# Patient Record
Sex: Male | Born: 1940
Health system: Southern US, Community
[De-identification: ages and names within clinical notes are randomized; demographics above are authoritative.]

## PROBLEM LIST (undated history)

## (undated) DIAGNOSIS — H9191 Unspecified hearing loss, right ear: Secondary | ICD-10-CM

## (undated) DIAGNOSIS — IMO0001 Reserved for inherently not codable concepts without codable children: Secondary | ICD-10-CM

## (undated) DIAGNOSIS — E11319 Type 2 diabetes mellitus with unspecified diabetic retinopathy without macular edema: Secondary | ICD-10-CM

## (undated) DIAGNOSIS — S065X9A Traumatic subdural hemorrhage with loss of consciousness of unspecified duration, initial encounter: Secondary | ICD-10-CM

## (undated) DIAGNOSIS — C61 Malignant neoplasm of prostate: Secondary | ICD-10-CM

## (undated) DIAGNOSIS — M549 Dorsalgia, unspecified: Secondary | ICD-10-CM

## (undated) DIAGNOSIS — N189 Chronic kidney disease, unspecified: Secondary | ICD-10-CM

## (undated) DIAGNOSIS — K760 Fatty (change of) liver, not elsewhere classified: Secondary | ICD-10-CM

## (undated) DIAGNOSIS — K8689 Other specified diseases of pancreas: Secondary | ICD-10-CM

## (undated) DIAGNOSIS — Z974 Presence of external hearing-aid: Secondary | ICD-10-CM

## (undated) DIAGNOSIS — E119 Type 2 diabetes mellitus without complications: Secondary | ICD-10-CM

## (undated) DIAGNOSIS — E782 Mixed hyperlipidemia: Secondary | ICD-10-CM

## (undated) DIAGNOSIS — M1990 Unspecified osteoarthritis, unspecified site: Secondary | ICD-10-CM

## (undated) DIAGNOSIS — N200 Calculus of kidney: Secondary | ICD-10-CM

## (undated) DIAGNOSIS — G8929 Other chronic pain: Secondary | ICD-10-CM

## (undated) DIAGNOSIS — S065XAA Traumatic subdural hemorrhage with loss of consciousness status unknown, initial encounter: Secondary | ICD-10-CM

## (undated) DIAGNOSIS — I5189 Other ill-defined heart diseases: Secondary | ICD-10-CM

## (undated) DIAGNOSIS — I10 Essential (primary) hypertension: Secondary | ICD-10-CM

## (undated) DIAGNOSIS — R011 Cardiac murmur, unspecified: Secondary | ICD-10-CM

## (undated) DIAGNOSIS — E875 Hyperkalemia: Secondary | ICD-10-CM

## (undated) DIAGNOSIS — H9190 Unspecified hearing loss, unspecified ear: Secondary | ICD-10-CM

## (undated) DIAGNOSIS — I2510 Atherosclerotic heart disease of native coronary artery without angina pectoris: Secondary | ICD-10-CM

## (undated) HISTORY — DX: Type 2 diabetes mellitus with unspecified diabetic retinopathy without macular edema: E11.319

## (undated) HISTORY — DX: Malignant neoplasm of prostate: C61

## (undated) HISTORY — DX: Fatty (change of) liver, not elsewhere classified: K76.0

## (undated) HISTORY — DX: Other chronic pain: M54.9

## (undated) HISTORY — DX: Mixed hyperlipidemia: E78.2

## (undated) HISTORY — DX: Essential (primary) hypertension: I10

## (undated) HISTORY — DX: Atherosclerotic heart disease of native coronary artery without angina pectoris: I25.10

## (undated) HISTORY — DX: Other specified diseases of pancreas: K86.89

## (undated) HISTORY — PX: CHOLECYSTECTOMY: SHX55

## (undated) HISTORY — DX: Other chronic pain: G89.29

## (undated) HISTORY — PX: HERNIA REPAIR: SHX51

## (undated) HISTORY — PX: OTHER SURGICAL HISTORY: SHX169

## (undated) HISTORY — PX: APPENDECTOMY: SHX54

## (undated) HISTORY — DX: Type 2 diabetes mellitus without complications: E11.9

---

## 2005-10-19 ENCOUNTER — Other Ambulatory Visit: Payer: Self-pay

## 2005-10-19 ENCOUNTER — Emergency Department: Payer: Self-pay | Admitting: Emergency Medicine

## 2005-10-21 ENCOUNTER — Observation Stay: Payer: Self-pay | Admitting: Internal Medicine

## 2005-10-21 ENCOUNTER — Other Ambulatory Visit: Payer: Self-pay

## 2009-08-05 DIAGNOSIS — C61 Malignant neoplasm of prostate: Secondary | ICD-10-CM

## 2009-08-05 HISTORY — PX: PROSTATECTOMY: SHX69

## 2009-08-05 HISTORY — DX: Malignant neoplasm of prostate: C61

## 2010-10-05 ENCOUNTER — Ambulatory Visit: Payer: Self-pay | Admitting: Unknown Physician Specialty

## 2010-11-04 ENCOUNTER — Ambulatory Visit: Payer: Self-pay | Admitting: Unknown Physician Specialty

## 2011-06-26 DIAGNOSIS — C61 Malignant neoplasm of prostate: Secondary | ICD-10-CM | POA: Insufficient documentation

## 2011-08-08 ENCOUNTER — Encounter: Payer: Self-pay | Admitting: Cardiology

## 2011-09-06 ENCOUNTER — Encounter: Payer: Self-pay | Admitting: Cardiology

## 2011-09-07 ENCOUNTER — Encounter: Payer: Self-pay | Admitting: Cardiology

## 2011-10-09 ENCOUNTER — Other Ambulatory Visit: Payer: Self-pay | Admitting: Family Medicine

## 2011-10-09 DIAGNOSIS — J984 Other disorders of lung: Secondary | ICD-10-CM

## 2011-10-10 ENCOUNTER — Ambulatory Visit (HOSPITAL_COMMUNITY)
Admission: RE | Admit: 2011-10-10 | Discharge: 2011-10-10 | Disposition: A | Discharge status: Home / Self Care | Payer: Medicare Other | Source: Ambulatory Visit | Attending: Family Medicine | Admitting: Family Medicine

## 2011-10-10 ENCOUNTER — Encounter: Payer: Self-pay | Admitting: Cardiology

## 2011-10-10 ENCOUNTER — Encounter (HOSPITAL_COMMUNITY): Payer: Self-pay

## 2011-10-10 DIAGNOSIS — J984 Other disorders of lung: Secondary | ICD-10-CM | POA: Insufficient documentation

## 2011-10-10 MED ORDER — IOHEXOL 300 MG/ML  SOLN
80.0000 mL | Freq: Once | INTRAMUSCULAR | Status: AC | PRN
  Administered 2011-10-10: 80 mL via INTRAVENOUS

## 2011-12-04 ENCOUNTER — Ambulatory Visit: Payer: Medicare Other | Admitting: Cardiology

## 2011-12-04 ENCOUNTER — Encounter: Payer: Self-pay | Admitting: Cardiology

## 2011-12-04 DIAGNOSIS — I5189 Other ill-defined heart diseases: Secondary | ICD-10-CM

## 2011-12-04 HISTORY — DX: Other ill-defined heart diseases: I51.89

## 2011-12-05 ENCOUNTER — Ambulatory Visit (INDEPENDENT_AMBULATORY_CARE_PROVIDER_SITE_OTHER): Payer: Medicare Other | Admitting: Cardiology

## 2011-12-05 ENCOUNTER — Encounter: Payer: Self-pay | Admitting: Cardiology

## 2011-12-05 VITALS — BP 141/90 | HR 85 | Resp 16 | Ht 67.0 in | Wt 212.0 lb

## 2011-12-05 DIAGNOSIS — I2510 Atherosclerotic heart disease of native coronary artery without angina pectoris: Secondary | ICD-10-CM

## 2011-12-05 DIAGNOSIS — E119 Type 2 diabetes mellitus without complications: Secondary | ICD-10-CM | POA: Insufficient documentation

## 2011-12-05 DIAGNOSIS — I10 Essential (primary) hypertension: Secondary | ICD-10-CM | POA: Insufficient documentation

## 2011-12-05 DIAGNOSIS — E1169 Type 2 diabetes mellitus with other specified complication: Secondary | ICD-10-CM | POA: Insufficient documentation

## 2011-12-05 DIAGNOSIS — E782 Mixed hyperlipidemia: Secondary | ICD-10-CM | POA: Insufficient documentation

## 2011-12-05 DIAGNOSIS — R0602 Shortness of breath: Secondary | ICD-10-CM | POA: Insufficient documentation

## 2011-12-05 NOTE — Assessment & Plan Note (Signed)
Etiology at this point not clearly defined, could be multifactorial in light of pulmonary testing to date. He does however have significant cardiovascular risk factors including long-standing type 2 diabetes mellitus and hypertension, hyperlipidemia, family history of premature cardiovascular disease. Also has coronary calcifications noted on recent chest CT. His ECG is nonspecific. Has a soft S4 on examination. Recommendation at this point is to pursue a complete echocardiogram to assess cardiac structure and function, also evaluate for ischemia with a Lexiscan Myoview. Doubt that he would be able to adequately complete a standard treadmill test in light of his limiting shortness of breath and other orthopedic complaints. We will bring him back to the office to discuss the results.

## 2011-12-05 NOTE — Assessment & Plan Note (Signed)
On statin therapy, followed by Dr. Dennard Schaumann.

## 2011-12-05 NOTE — Assessment & Plan Note (Signed)
Long-standing, on medical therapy, followed by Dr. Dennard Schaumann.

## 2011-12-05 NOTE — Patient Instructions (Signed)
**Note De-Identified [name] Obfuscation** Your physician has requested that you have an echocardiogram. Echocardiography is a painless test that uses sound waves to create images of your heart. It provides your doctor with information about the size and shape of your heart and how well your heart's chambers and valves are working. This procedure takes approximately one hour. There are no restrictions for this procedure.  Your physician has requested that you have a lexiscan myoview. For further information please visit [email or web address]. Please follow instruction sheet, as given.  Your physician recommends that you schedule a follow-up appointment in: 3 weeks

## 2011-12-05 NOTE — Assessment & Plan Note (Signed)
Diagnosis based on documentation of coronary calcifications by chest CT. This is to be further assessed in light of his progressive shortness of breath for the presence of any obstructive CAD.

## 2011-12-05 NOTE — Assessment & Plan Note (Signed)
Blood pressure is mildly elevated today. He reports generally better control on home checks. States that he has been compliant with his medications.

## 2011-12-05 NOTE — Progress Notes (Signed)
Clinical Summary [name] is a 71 y.o.male referred for cardiology consultation by Dr. Dennard Schaumann. He presents with approximately 4 month history of progressive shortness of breath with activity. Describes himself as being previously active, working outdoors without major limitation other than orthopedic complaints related to chronic lower back pain and hip pain. Since then he has had shortness of breath even with basic activities of daily living, walking out of the car, going up steps. Has also had occasional cough, nonproductive. No fevers or chills. States he feels "full" in his chest, although denies any definite exertional chest pain.  Recent testing reviewed including PFTs showing FVC 47% predicted, FEV1 54% predicted. Chest CT reported elevation of the right hemidiaphragm and atelectasis in RML/RLL, also some degree of coronary calcifications. Labwork shows Hgb 12.8, potassium 4.8, BUN 30, creatinine 1.1, AST 30. ECG reviewed showing sinus rhythm with leftward axis, NST wave changes.  Medical history reviewed including long-standing diabetes and hypertension, managed medically. He does have some history of premature cardiovascular disease in his family. Reports a stress test at least 15 years ago, no ischemic testing since that time.  No Known Allergies  Current Outpatient Prescriptions  Medication Sig Dispense Refill  . aspirin 81 MG tablet Take 81 mg by mouth daily.      . carvedilol (COREG) 25 MG tablet Take 25 mg by mouth 2 (two) times daily with a meal.      . citalopram (CELEXA) 20 MG tablet Take 20 mg by mouth daily.      Marland Kitchen gabapentin (NEURONTIN) 300 MG capsule Take 600 mg by mouth at bedtime.      . hydrochlorothiazide (MICROZIDE) 12.5 MG capsule Take 12.5 mg by mouth daily.      . insulin NPH-insulin regular (NOVOLIN 70/30) (70-30) 100 UNIT/ML injection Inject into the skin.      Marland Kitchen losartan (COZAAR) 100 MG tablet Take 100 mg by mouth daily.      . metFORMIN (GLUCOPHAGE) 1000 MG  tablet Take 1,000 mg by mouth 2 (two) times daily with a meal.      . Multiple Vitamins-Minerals (MULTIVITAMIN PO) Take by mouth daily.      . Pancrelipase, Lip-Prot-Amyl, (CREON PO) Take by mouth.      . pravastatin (PRAVACHOL) 10 MG tablet Take 10 mg by mouth daily.        Past Medical History  Diagnosis Date  . Type 2 diabetes mellitus   . Essential hypertension, benign   . Prostate cancer 2011  . Mixed hyperlipidemia   . Chronic back pain     Disc disease  . Pancreatic insufficiency     Diagnosed at [facility]    Past Surgical History  Procedure Date  . Prostatectomy 2011  . Hernia repair   . Appendectomy   . Cholecystectomy   . Bilateral hip replacement     Family History  Problem Relation Age of Onset  . Heart attack Father   . Diabetes type II Mother     Social History [name] reports that he has never smoked. He has never used smokeless tobacco. [name] reports that he does not drink alcohol.  Review of Systems No palpitations, lightheadedness, syncope. No hemoptysis. Has tried to lose weight without significant change. Reports some fluctuation in his appetite. No melena or hematochezia. Otherwise negative except as outlined.  Physical Examination Filed Vitals:   12/05/11 0909  BP: 141/90  Pulse: 85  Resp: 16   Obese male in no acute distress. HEENT: Conjunctiva  and lids normal, oropharynx clear. Neck: Supple, prominent JV pulsations, no carotid bruits, no thyromegaly. Lungs: Clear to auscultation, decreased at bases, nonlabored breathing at rest, no wheezing. Cardiac: Regular rate and rhythm, S4 noted, no significant systolic murmur, no pericardial rub or knock. Abdomen: Protuberant, nontender, bowel sounds present, no guarding or rebound. Extremities: No pitting edema, distal pulses 2+. Skin: Warm and dry. Musculoskeletal: No kyphosis. Neuropsychiatric: Alert and oriented x3, affect grossly appropriate.     Problem List and Plan

## 2011-12-12 ENCOUNTER — Encounter (HOSPITAL_COMMUNITY)
Admission: RE | Admit: 2011-12-12 | Discharge: 2011-12-12 | Disposition: A | Discharge status: Home / Self Care | Payer: Medicare Other | Source: Ambulatory Visit | Attending: Cardiology | Admitting: Cardiology

## 2011-12-12 ENCOUNTER — Ambulatory Visit (HOSPITAL_COMMUNITY)
Admission: RE | Admit: 2011-12-12 | Discharge: 2011-12-12 | Disposition: A | Discharge status: Home / Self Care | Payer: Medicare Other | Source: Ambulatory Visit | Attending: Cardiology | Admitting: Cardiology

## 2011-12-12 ENCOUNTER — Encounter (HOSPITAL_COMMUNITY): Payer: Self-pay | Admitting: Cardiology

## 2011-12-12 ENCOUNTER — Ambulatory Visit (INDEPENDENT_AMBULATORY_CARE_PROVIDER_SITE_OTHER): Payer: Medicare Other

## 2011-12-12 ENCOUNTER — Encounter (HOSPITAL_COMMUNITY): Payer: Self-pay

## 2011-12-12 DIAGNOSIS — R0602 Shortness of breath: Secondary | ICD-10-CM

## 2011-12-12 DIAGNOSIS — E119 Type 2 diabetes mellitus without complications: Secondary | ICD-10-CM | POA: Insufficient documentation

## 2011-12-12 DIAGNOSIS — I2510 Atherosclerotic heart disease of native coronary artery without angina pectoris: Secondary | ICD-10-CM

## 2011-12-12 DIAGNOSIS — I059 Rheumatic mitral valve disease, unspecified: Secondary | ICD-10-CM

## 2011-12-12 DIAGNOSIS — E785 Hyperlipidemia, unspecified: Secondary | ICD-10-CM | POA: Insufficient documentation

## 2011-12-12 DIAGNOSIS — R0989 Other specified symptoms and signs involving the circulatory and respiratory systems: Secondary | ICD-10-CM | POA: Insufficient documentation

## 2011-12-12 DIAGNOSIS — I10 Essential (primary) hypertension: Secondary | ICD-10-CM | POA: Insufficient documentation

## 2011-12-12 DIAGNOSIS — R0609 Other forms of dyspnea: Secondary | ICD-10-CM | POA: Insufficient documentation

## 2011-12-12 DIAGNOSIS — R079 Chest pain, unspecified: Secondary | ICD-10-CM | POA: Insufficient documentation

## 2011-12-12 MED ORDER — TECHNETIUM TC 99M TETROFOSMIN IV KIT
10.0000 | PACK | Freq: Once | INTRAVENOUS | Status: AC | PRN
  Administered 2011-12-12: 9.5 via INTRAVENOUS

## 2011-12-12 MED ORDER — TECHNETIUM TC 99M TETROFOSMIN IV KIT
30.0000 | PACK | Freq: Once | INTRAVENOUS | Status: AC | PRN
  Administered 2011-12-12: 29 via INTRAVENOUS

## 2011-12-12 NOTE — Progress Notes (Signed)
*  PRELIMINARY RESULTS* Echocardiogram 2D Echocardiogram has been performed.  Tera Partridge 12/12/2011, 8:41 AM

## 2011-12-12 NOTE — Progress Notes (Signed)
Stress Lab Nurses Notes - [name]  [name] 12/12/2011 Reason for doing test: CAD and Dyspnea Type of test: Leane Call Nurse performing test: Gerrit Halls, RN Nuclear Medicine Tech: Melburn Hake Echo Tech: Not Applicable MD performing test: Myles Gip Family MD: Pickard Test explained and consent signed: yes IV started: 22g jelco, Saline lock flushed, No redness or edema and Saline lock started in radiology Symptoms: SOB  Treatment/Intervention: None Reason test stopped: protocol completed After recovery IV was: Discontinued via X-ray tech and No redness or edema Patient to return to [place]. Med at : 12:30 Patient discharged: Home Patient's Condition upon discharge was: stable Comments: During test BP 158/88 & HR 100.  Recovery BP 148/78 & HR 90.  Symptoms resolved in recovery. Geanie Cooley T

## 2011-12-27 ENCOUNTER — Ambulatory Visit: Payer: Medicare Other | Admitting: Cardiology

## 2012-01-02 ENCOUNTER — Encounter: Payer: Self-pay | Admitting: Cardiology

## 2012-01-02 ENCOUNTER — Ambulatory Visit (INDEPENDENT_AMBULATORY_CARE_PROVIDER_SITE_OTHER): Payer: Medicare Other | Admitting: Cardiology

## 2012-01-02 VITALS — BP 135/82 | HR 82 | Resp 18 | Ht 69.0 in | Wt 211.0 lb

## 2012-01-02 DIAGNOSIS — I2510 Atherosclerotic heart disease of native coronary artery without angina pectoris: Secondary | ICD-10-CM

## 2012-01-02 DIAGNOSIS — I10 Essential (primary) hypertension: Secondary | ICD-10-CM

## 2012-01-02 DIAGNOSIS — E782 Mixed hyperlipidemia: Secondary | ICD-10-CM

## 2012-01-02 NOTE — Assessment & Plan Note (Signed)
Coronary artery calcifications by chest CT, also Myoview indicating element of scar in the inferolateral wall. No large ischemic zones noted however, and overall LVEF is normal. Particularly in light of his improved clinical status, plan will be to continue medical therapy and observation. Followup is arranged.

## 2012-01-02 NOTE — Progress Notes (Signed)
Clinical Summary [name] is a 71 y.o.male presenting for followup. He was seen in early May. He is here with his wife today to review testing. From a symptom perspective, he reports significantly improved breathing, no chest pain.  Followup echocardiogram demonstrated LVEF of 55-60% with mild basal septal hypertrophy, mild hypokinesis of the basal inferolateral myocardium, mild mitral regurgitation, mild left atrial enlargement, high normal pulmonary pressure. Myoview demonstrated no diagnostic ST segment changes. There was evidence of inferolateral scar versus attenuation with LVEF of 64%. No large areas of ischemia were noted.  We discussed his cardiac testing, diagnosis of underlying CAD, and a plan at this point for observation on medical therapy, particularly in light of the fact that he has improved clinically. We did discuss warning signs that might prompt further investigation.  No Known Allergies  Current Outpatient Prescriptions  Medication Sig Dispense Refill  . aspirin 81 MG tablet Take 81 mg by mouth daily.      . carvedilol (COREG) 25 MG tablet Take 25 mg by mouth 2 (two) times daily with a meal.      . citalopram (CELEXA) 20 MG tablet Take 20 mg by mouth daily.      Marland Kitchen gabapentin (NEURONTIN) 300 MG capsule Take 600 mg by mouth at bedtime.      . hydrochlorothiazide (MICROZIDE) 12.5 MG capsule Take 12.5 mg by mouth daily.      . insulin NPH-insulin regular (NOVOLIN 70/30) (70-30) 100 UNIT/ML injection Inject into the skin.      Marland Kitchen losartan (COZAAR) 100 MG tablet Take 100 mg by mouth daily.      . metFORMIN (GLUCOPHAGE) 1000 MG tablet Take 1,000 mg by mouth 2 (two) times daily with a meal.      . Multiple Vitamins-Minerals (MULTIVITAMIN PO) Take by mouth daily.      . Pancrelipase, Lip-Prot-Amyl, (CREON PO) Take by mouth.      . pravastatin (PRAVACHOL) 10 MG tablet Take 10 mg by mouth daily.        Past Medical History  Diagnosis Date  . Type 2 diabetes mellitus   .  Essential hypertension, benign   . Prostate cancer 2011  . Mixed hyperlipidemia   . Chronic back pain     Disc disease  . Pancreatic insufficiency     Diagnosed at [facility]  . Diabetes mellitus     Social History Mr. Durkin reports that he has never smoked. He has never used smokeless tobacco. Mr. Wanke reports that he does not drink alcohol.  Review of Systems No palpitations, orthopnea, PND. No reported bleeding problems. Otherwise negative.  Physical Examination Filed Vitals:   01/02/12 1428  BP: 135/82  Pulse: 82  Resp: 18    Obese male in no acute distress.  HEENT: Conjunctiva and lids normal, oropharynx clear.  Neck: Supple, prominent JV pulsations, no carotid bruits, no thyromegaly.  Lungs: Clear to auscultation, decreased at bases, nonlabored breathing at rest, no wheezing.  Cardiac: Regular rate and rhythm, S4 noted, no significant systolic murmur, no pericardial rub or knock.  Abdomen: Protuberant, nontender, bowel sounds present, no guarding or rebound.  Extremities: No pitting edema, distal pulses 2+.    Problem List and Plan   Coronary atherosclerosis of native coronary artery Coronary artery calcifications by chest CT, also Myoview indicating element of scar in the inferolateral wall. No large ischemic zones noted however, and overall LVEF is normal. Particularly in light of his improved clinical status, plan will be to continue medical therapy  and observation. Followup is arranged.  Essential hypertension, benign No change to current regimen.  Mixed hyperlipidemia Continue statin therapy.     Satira Sark, M.D., F.A.C.C.

## 2012-01-02 NOTE — Patient Instructions (Signed)
**Note De-identified Elvina Bosch Obfuscation** Your physician recommends that you continue on your current medications as directed. Please refer to the Current Medication list given to you today.  Your physician recommends that you schedule a follow-up appointment in: 6 months  

## 2012-01-02 NOTE — Assessment & Plan Note (Signed)
No change to current regimen.

## 2012-01-02 NOTE — Assessment & Plan Note (Signed)
Continue statin therapy.

## 2012-06-08 ENCOUNTER — Ambulatory Visit
Admission: RE | Admit: 2012-06-08 | Discharge: 2012-06-08 | Disposition: A | Discharge status: Home / Self Care | Payer: Medicare Other | Source: Ambulatory Visit | Attending: Family Medicine | Admitting: Family Medicine

## 2012-06-08 ENCOUNTER — Other Ambulatory Visit: Payer: Self-pay | Admitting: Family Medicine

## 2012-06-08 DIAGNOSIS — J189 Pneumonia, unspecified organism: Secondary | ICD-10-CM

## 2012-07-13 ENCOUNTER — Ambulatory Visit (INDEPENDENT_AMBULATORY_CARE_PROVIDER_SITE_OTHER): Payer: Medicare Other | Admitting: Cardiology

## 2012-07-13 ENCOUNTER — Encounter: Payer: Self-pay | Admitting: Cardiology

## 2012-07-13 VITALS — BP 130/80 | HR 74 | Ht 69.0 in | Wt 214.8 lb

## 2012-07-13 DIAGNOSIS — I10 Essential (primary) hypertension: Secondary | ICD-10-CM

## 2012-07-13 DIAGNOSIS — I2510 Atherosclerotic heart disease of native coronary artery without angina pectoris: Secondary | ICD-10-CM

## 2012-07-13 DIAGNOSIS — E782 Mixed hyperlipidemia: Secondary | ICD-10-CM

## 2012-07-13 NOTE — Assessment & Plan Note (Signed)
Keep followup with Dr. Dennard Schaumann. He continues on Pravachol. Goal LDL should be close to 70 if possible.

## 2012-07-13 NOTE — Assessment & Plan Note (Signed)
Continue medical therapy and observation for now. We have discussed warning signs and symptoms. Followup arranged.

## 2012-07-13 NOTE — Patient Instructions (Addendum)
Your physician recommends that you schedule a follow-up appointment in: 6 [place]

## 2012-07-13 NOTE — Progress Notes (Signed)
Clinical Summary [name] is a 71 y.o.male presenting for followup. He was seen in May. He is here with his wife, reports no significant angina symptoms. We have been managing him medically for CAD based on coronary calcifications by CT and Myoview demonstrating inferolateral scar. He reports tolerating his current medical regimen.  ECG today shows sinus rhythm with PRWP, possibly related to lead placement.  He continues to follow with Dr. Dennard Schaumann.  No Known Allergies  Current Outpatient Prescriptions  Medication Sig Dispense Refill  . aspirin 81 MG tablet Take 81 mg by mouth daily.      . carvedilol (COREG) 25 MG tablet Take 25 mg by mouth 2 (two) times daily with a meal.      . citalopram (CELEXA) 20 MG tablet Take 20 mg by mouth daily.      . diazepam (VALIUM) 10 MG tablet Take 10 mg by mouth every 8 (eight) hours as needed.       . gabapentin (NEURONTIN) 300 MG capsule Take 600 mg by mouth at bedtime.      . hydrochlorothiazide (MICROZIDE) 12.5 MG capsule Take 12.5 mg by mouth daily.      . insulin NPH-insulin regular (NOVOLIN 70/30) (70-30) 100 UNIT/ML injection Inject into the skin.      Marland Kitchen losartan (COZAAR) 100 MG tablet Take 100 mg by mouth daily.      . metFORMIN (GLUCOPHAGE) 1000 MG tablet Take 1,000 mg by mouth 2 (two) times daily with a meal.      . Multiple Vitamins-Minerals (MULTIVITAMIN PO) Take by mouth daily.      . Pancrelipase, Lip-Prot-Amyl, (CREON PO) Take by mouth.      . pravastatin (PRAVACHOL) 10 MG tablet Take 10 mg by mouth daily.      Marland Kitchen PROAIR HFA 108 (90 BASE) MCG/ACT inhaler Inhale 2 puffs into the lungs every 6 (six) hours as needed.         Past Medical History  Diagnosis Date  . Type 2 diabetes mellitus   . Essential hypertension, benign   . Prostate cancer 2011  . Mixed hyperlipidemia   . Chronic back pain     Disc disease  . Pancreatic insufficiency     Diagnosed at [facility]  . Diabetes mellitus   . Coronary atherosclerosis of native coronary  artery     Coronary calcifications by chest CT, Myoview demonstrating inferolateral scar    Social History [name] reports that he has never smoked. He has never used smokeless tobacco. [name] reports that he does not drink alcohol.  Review of Systems No palpitations. Reports recent "pneumonia" treated with outpatient antibiotics. Otherwise negative.  Physical Examination Filed Vitals:   07/13/12 1328  BP: 130/80  Pulse: 74   Filed Weights   07/13/12 1328  Weight: 214 lb 12.8 oz (97.433 kg)    No acute distress.  HEENT: Conjunctiva and lids normal, oropharynx clear.  Neck: Supple, prominent JV pulsations, no carotid bruits, no thyromegaly.  Lungs: Clear to auscultation, decreased at bases, nonlabored breathing at rest, no wheezing.  Cardiac: Regular rate and rhythm, S4 noted, no significant systolic murmur.  Extremities: No pitting edema, distal pulses 2+.    Problem List and Plan   Coronary atherosclerosis of native coronary artery Continue medical therapy and observation for now. We have discussed warning signs and symptoms. Followup arranged.  Essential hypertension, benign No change to current regimen. Regular exercise and sodium restriction.  Mixed hyperlipidemia Keep followup with Dr. Dennard Schaumann. He continues  on Pravachol. Goal LDL should be close to 70 if possible.    Satira Sark, M.D., F.A.C.C.

## 2012-07-13 NOTE — Assessment & Plan Note (Signed)
No change to current regimen. Regular exercise and sodium restriction.

## 2012-07-30 ENCOUNTER — Encounter (HOSPITAL_COMMUNITY): Payer: Self-pay

## 2012-07-30 ENCOUNTER — Emergency Department (HOSPITAL_COMMUNITY): Payer: Medicare Other

## 2012-07-30 ENCOUNTER — Emergency Department (HOSPITAL_COMMUNITY)
Admission: EM | Admit: 2012-07-30 | Discharge: 2012-07-30 | Disposition: A | Discharge status: Home / Self Care | Payer: Medicare Other | Attending: Emergency Medicine | Admitting: Emergency Medicine

## 2012-07-30 DIAGNOSIS — Z8546 Personal history of malignant neoplasm of prostate: Secondary | ICD-10-CM | POA: Insufficient documentation

## 2012-07-30 DIAGNOSIS — R062 Wheezing: Secondary | ICD-10-CM | POA: Insufficient documentation

## 2012-07-30 DIAGNOSIS — E876 Hypokalemia: Secondary | ICD-10-CM | POA: Insufficient documentation

## 2012-07-30 DIAGNOSIS — Z794 Long term (current) use of insulin: Secondary | ICD-10-CM | POA: Insufficient documentation

## 2012-07-30 DIAGNOSIS — Z7982 Long term (current) use of aspirin: Secondary | ICD-10-CM | POA: Insufficient documentation

## 2012-07-30 DIAGNOSIS — R5381 Other malaise: Secondary | ICD-10-CM | POA: Insufficient documentation

## 2012-07-30 DIAGNOSIS — E119 Type 2 diabetes mellitus without complications: Secondary | ICD-10-CM | POA: Insufficient documentation

## 2012-07-30 DIAGNOSIS — R059 Cough, unspecified: Secondary | ICD-10-CM | POA: Insufficient documentation

## 2012-07-30 DIAGNOSIS — R05 Cough: Secondary | ICD-10-CM | POA: Insufficient documentation

## 2012-07-30 DIAGNOSIS — I2510 Atherosclerotic heart disease of native coronary artery without angina pectoris: Secondary | ICD-10-CM | POA: Insufficient documentation

## 2012-07-30 DIAGNOSIS — E782 Mixed hyperlipidemia: Secondary | ICD-10-CM | POA: Insufficient documentation

## 2012-07-30 DIAGNOSIS — Z79899 Other long term (current) drug therapy: Secondary | ICD-10-CM | POA: Insufficient documentation

## 2012-07-30 DIAGNOSIS — M549 Dorsalgia, unspecified: Secondary | ICD-10-CM | POA: Insufficient documentation

## 2012-07-30 DIAGNOSIS — G8929 Other chronic pain: Secondary | ICD-10-CM | POA: Insufficient documentation

## 2012-07-30 DIAGNOSIS — I10 Essential (primary) hypertension: Secondary | ICD-10-CM | POA: Insufficient documentation

## 2012-07-30 DIAGNOSIS — Z8719 Personal history of other diseases of the digestive system: Secondary | ICD-10-CM | POA: Insufficient documentation

## 2012-07-30 HISTORY — DX: Other ill-defined heart diseases: I51.89

## 2012-07-30 LAB — COMPREHENSIVE METABOLIC PANEL
ALT: 63 U/L — ABNORMAL HIGH (ref 0–53)
Albumin: 3.1 g/dL — ABNORMAL LOW (ref 3.5–5.2)
Alkaline Phosphatase: 135 U/L — ABNORMAL HIGH (ref 39–117)
Calcium: 8.5 mg/dL (ref 8.4–10.5)
GFR calc Af Amer: 43 mL/min — ABNORMAL LOW (ref >90)
Potassium: 6 mEq/L — ABNORMAL HIGH (ref 3.5–5.1)
Sodium: 135 mEq/L (ref 135–145)
Total Protein: 6.5 g/dL (ref 6.0–8.3)

## 2012-07-30 LAB — URINALYSIS, ROUTINE W REFLEX MICROSCOPIC
Bilirubin Urine: NEGATIVE
Hgb urine dipstick: NEGATIVE
Nitrite: NEGATIVE
Specific Gravity, Urine: >1.03 — ABNORMAL HIGH (ref 1.005–1.030)
Urobilinogen, UA: 0.2 mg/dL (ref 0.0–1.0)
pH: 5.5 (ref 5.0–8.0)

## 2012-07-30 LAB — RAPID URINE DRUG SCREEN, HOSP PERFORMED
Barbiturates: NOT DETECTED
Benzodiazepines: POSITIVE — AB
Cocaine: NOT DETECTED
Opiates: POSITIVE — AB
Tetrahydrocannabinol: NOT DETECTED

## 2012-07-30 LAB — CBC WITH DIFFERENTIAL/PLATELET
Basophils Absolute: 0 10*3/uL (ref 0.0–0.1)
Basophils Relative: 0 % (ref 0–1)
Eosinophils Absolute: 0.3 10*3/uL (ref 0.0–0.7)
Eosinophils Relative: 5 % (ref 0–5)
MCH: 30.8 pg (ref 26.0–34.0)
MCHC: 31.6 g/dL (ref 30.0–36.0)
MCV: 97.6 fL (ref 78.0–100.0)
Neutrophils Relative %: 64 % (ref 43–77)
Platelets: 150 10*3/uL (ref 150–400)
RBC: 3.73 MIL/uL — ABNORMAL LOW (ref 4.22–5.81)
RDW: 13.4 % (ref 11.5–15.5)

## 2012-07-30 MED ORDER — SODIUM CHLORIDE 0.9 % IV BOLUS (SEPSIS)
500.0000 mL | Freq: Once | INTRAVENOUS | Status: AC
  Administered 2012-07-30: 14:00:00 via INTRAVENOUS

## 2012-07-30 MED ORDER — SODIUM CHLORIDE 0.9 % IV SOLN
Freq: Once | INTRAVENOUS | Status: AC
  Administered 2012-07-30: 1000 mL via INTRAVENOUS

## 2012-07-30 MED ORDER — ALBUTEROL SULFATE HFA 108 (90 BASE) MCG/ACT IN AERS: 1.0000 | INHALATION_SPRAY | Freq: Four times a day (QID) | RESPIRATORY_TRACT | Status: DC | PRN

## 2012-07-30 MED ORDER — NALOXONE HCL 1 MG/ML IJ SOLN
1.0000 mg | Freq: Once | INTRAMUSCULAR | Status: AC
  Administered 2012-07-30: 1 mg via INTRAVENOUS
  Filled 2012-07-30: qty 2

## 2012-07-30 NOTE — ED Provider Notes (Signed)
History    This chart was scribed for Mark Diego, MD, MD by Rhae Lerner, ED Scribe. The patient was seen in room APA17 and the patient's care was started at 12:11 PM.   CSN: [id number]  Arrival date & time 07/30/12  1142      Chief Complaint  Patient presents with  . Hypotension  . Chest Pain    (Consider location/radiation/quality/duration/timing/severity/associated sxs/prior treatment) Patient is a 71 y.o. male presenting with chest pain. The history is provided by the patient. No language interpreter was used.  Chest Pain The chest pain began 3 - 5 days ago. Chest pain occurs frequently. The chest pain is unchanged. The pain is associated with coughing. The pain does not radiate. Primary symptoms include fatigue, cough and wheezing. Pertinent negatives for primary symptoms include no abdominal pain.  The fatigue began 3 to 5 days ago. The fatigue has been unchanged since its onset. The fatigue is worsened by nothing.  The cough began 3 to 5 days ago. The cough is new.  Pertinent negatives for past medical history include no seizures.    [name] is a 71 y.o. male who presents to the Emergency Department complaining of fatigue onset 3 days ago. Pt's wife reports that pt has been sleepy and was in bed for the majority of yesterday. Pt's BP was 80/50 at PCP (Dr. Dennard Schaumann) and he was sent here for further evaluation. Wife reports that pt has been wheezing and had cough. Pt states he has associated chest pain. He denies any other pain.   Past Medical History  Diagnosis Date  . Type 2 diabetes mellitus   . Essential hypertension, benign   . Prostate cancer 2011  . Mixed hyperlipidemia   . Chronic back pain     Disc disease  . Pancreatic insufficiency     Diagnosed at [facility]  . Diabetes mellitus   . Coronary atherosclerosis of native coronary artery     Coronary calcifications by chest CT, Myoview demonstrating inferolateral scar    Past Surgical History  Procedure  Date  . Prostatectomy 2011  . Hernia repair   . Appendectomy   . Cholecystectomy   . Bilateral hip replacement     Family History  Problem Relation Age of Onset  . Heart attack Father   . Diabetes type II Mother     History  Substance Use Topics  . Smoking status: Never Smoker   . Smokeless tobacco: Never Used  . Alcohol Use: No      Review of Systems  Constitutional: Positive for fatigue.  HENT: Negative for congestion, sinus pressure and ear discharge.   Eyes: Negative for discharge.  Respiratory: Positive for cough and wheezing.   Cardiovascular: Positive for chest pain.  Gastrointestinal: Negative for abdominal pain and diarrhea.  Genitourinary: Negative for frequency and hematuria.  Musculoskeletal: Negative for back pain.  Skin: Negative for rash.  Neurological: Negative for seizures and headaches.  Hematological: Negative.   Psychiatric/Behavioral: Negative for hallucinations.  All other systems reviewed and are negative.    Allergies  Review of patient's allergies indicates no known allergies.  Home Medications   Current Outpatient Rx  Name  Route  Sig  Dispense  Refill  . ASPIRIN 81 MG PO TABS   Oral   Take 81 mg by mouth daily.         Marland Kitchen CARVEDILOL 25 MG PO TABS   Oral   Take 25 mg by mouth 2 (two) times daily with a  meal.         . CITALOPRAM HYDROBROMIDE 20 MG PO TABS   Oral   Take 20 mg by mouth daily.         Marland Kitchen DIAZEPAM 10 MG PO TABS   Oral   Take 10 mg by mouth every 8 (eight) hours as needed.          Marland Kitchen GABAPENTIN 300 MG PO CAPS   Oral   Take 600 mg by mouth at bedtime.         Marland Kitchen HYDROCHLOROTHIAZIDE 12.5 MG PO CAPS   Oral   Take 12.5 mg by mouth daily.         . INSULIN ISOPHANE & REGULAR (70-30) 100 UNIT/ML Craig SUSP   Subcutaneous   Inject into the skin.         Marland Kitchen LOSARTAN POTASSIUM 100 MG PO TABS   Oral   Take 100 mg by mouth daily.         Marland Kitchen METFORMIN HCL 1000 MG PO TABS   Oral   Take 1,000 mg by mouth  2 (two) times daily with a meal.         . MULTIVITAMIN PO   Oral   Take by mouth daily.         Marland Kitchen CREON PO   Oral   Take by mouth.         Marland Kitchen PRAVASTATIN SODIUM 10 MG PO TABS   Oral   Take 10 mg by mouth daily.         Marland Kitchen PROAIR HFA 108 (90 BASE) MCG/ACT IN AERS   Inhalation   Inhale 2 puffs into the lungs every 6 (six) hours as needed.            BP 115/60  Pulse 79  Temp 97.8 F (36.6 C) (Oral)  Resp 22  Wt 219 lb (99.338 kg)  SpO2 94%  Physical Exam  Nursing note and vitals reviewed. Constitutional: He is oriented to person, place, and time. He appears well-developed.       Lethargic   HENT:  Head: Normocephalic and atraumatic.  Eyes: Conjunctivae normal and EOM are normal. No scleral icterus.  Neck: Neck supple. No thyromegaly present.  Cardiovascular: Normal rate and regular rhythm.  Exam reveals no gallop and no friction rub.   No murmur heard. Pulmonary/Chest: No stridor. He has no wheezes. He has no rales. He exhibits no tenderness.  Abdominal: He exhibits no distension. There is no tenderness. There is no rebound.  Musculoskeletal: Normal range of motion. He exhibits no edema.  Lymphadenopathy:    He has no cervical adenopathy.  Neurological: He is oriented to person, place, and time. Coordination normal.  Skin: No rash noted. No erythema.  Psychiatric: He has a normal mood and affect. His behavior is normal.    ED Course  Procedures (including critical care time) DIAGNOSTIC STUDIES: Oxygen Saturation is 94% on room air, normal by my interpretation.    COORDINATION OF CARE: 12:14 PM Discussed ED treatment with pt     Labs Reviewed - No data to display No results found.   No diagnosis found.    Date: 07/30/2012  Rate: 77  Rhythm: normal sinus rhythm  QRS Axis: normal  Intervals: normal  ST/T Wave abnormalities: normal  Conduction Disutrbances:none  Narrative Interpretation:   Old EKG Reviewed: none available   MDM    Hypokalemia,   I spoke with the hospitalist and we will hold his hctz, coreg  and cozaar until rechecked in the am.  He will have his potassium checked and if his bp is ok he can start back only coreg and hctz and follow up with his md     The chart was scribed for me under my direct supervision.  I personally performed the history, physical, and medical decision making and all procedures in the evaluation of this patient.Mark Diego, MD 07/30/12 [phone]

## 2012-07-30 NOTE — ED Notes (Signed)
Sob with wheezing along with cp. Pt seen by PCP and sent over ? Pneumonia.

## 2012-07-31 ENCOUNTER — Encounter (HOSPITAL_COMMUNITY): Payer: Self-pay | Admitting: *Deleted

## 2012-07-31 ENCOUNTER — Emergency Department (HOSPITAL_COMMUNITY)
Admission: EM | Admit: 2012-07-31 | Discharge: 2012-07-31 | Disposition: A | Discharge status: Home / Self Care | Payer: Medicare Other | Attending: Emergency Medicine | Admitting: Emergency Medicine

## 2012-07-31 DIAGNOSIS — Z79899 Other long term (current) drug therapy: Secondary | ICD-10-CM | POA: Insufficient documentation

## 2012-07-31 DIAGNOSIS — Z794 Long term (current) use of insulin: Secondary | ICD-10-CM | POA: Insufficient documentation

## 2012-07-31 DIAGNOSIS — G8929 Other chronic pain: Secondary | ICD-10-CM | POA: Insufficient documentation

## 2012-07-31 DIAGNOSIS — Z8546 Personal history of malignant neoplasm of prostate: Secondary | ICD-10-CM | POA: Insufficient documentation

## 2012-07-31 DIAGNOSIS — M549 Dorsalgia, unspecified: Secondary | ICD-10-CM | POA: Insufficient documentation

## 2012-07-31 DIAGNOSIS — Z8679 Personal history of other diseases of the circulatory system: Secondary | ICD-10-CM | POA: Insufficient documentation

## 2012-07-31 DIAGNOSIS — E119 Type 2 diabetes mellitus without complications: Secondary | ICD-10-CM | POA: Insufficient documentation

## 2012-07-31 DIAGNOSIS — E782 Mixed hyperlipidemia: Secondary | ICD-10-CM | POA: Insufficient documentation

## 2012-07-31 DIAGNOSIS — Z8719 Personal history of other diseases of the digestive system: Secondary | ICD-10-CM | POA: Insufficient documentation

## 2012-07-31 DIAGNOSIS — E875 Hyperkalemia: Secondary | ICD-10-CM

## 2012-07-31 DIAGNOSIS — I2510 Atherosclerotic heart disease of native coronary artery without angina pectoris: Secondary | ICD-10-CM | POA: Insufficient documentation

## 2012-07-31 DIAGNOSIS — Z7982 Long term (current) use of aspirin: Secondary | ICD-10-CM | POA: Insufficient documentation

## 2012-07-31 DIAGNOSIS — I10 Essential (primary) hypertension: Secondary | ICD-10-CM | POA: Insufficient documentation

## 2012-07-31 HISTORY — DX: Hyperkalemia: E87.5

## 2012-07-31 LAB — BASIC METABOLIC PANEL
BUN: 25 mg/dL — ABNORMAL HIGH (ref 6–23)
CO2: 23 mEq/L (ref 19–32)
GFR calc non Af Amer: 48 mL/min — ABNORMAL LOW (ref >90)
Glucose, Bld: 350 mg/dL — ABNORMAL HIGH (ref 70–99)
Potassium: 4.5 mEq/L (ref 3.5–5.1)

## 2012-07-31 NOTE — ED Notes (Signed)
Pt instructed to come back in this morning to have potassium rechecked and to be re evaluated by EMD. NAD

## 2012-07-31 NOTE — ED Provider Notes (Signed)
History    This chart was scribed for Veryl Speak, MD, MD by Rhae Lerner, ED Scribe. The patient was seen in room APA03 and the patient's care was started at 11:39 AM.   CSN: [id number]  Arrival date & time 07/31/12  1110       Chief Complaint  Patient presents with  . Follow-up    (Consider location/radiation/quality/duration/timing/severity/associated sxs/prior treatment) The history is provided by the patient. No language interpreter was used.   [name] is a 71 y.o. male who presents to the Emergency Department due to recheck potassium levels. Pt was in ED 1 day ago due to BP being 80/50. Pt was instructed to come back for follow up and check potassium. Pt denies taking HTN medication (hydrochlorothiazide, coreg and cozaar) after discharge and today per EDP's request. Pt had high level of potassium 1 day ago. Pt denies heart complications.   Past Medical History  Diagnosis Date  . Type 2 diabetes mellitus   . Essential hypertension, benign   . Prostate cancer 2011  . Mixed hyperlipidemia   . Chronic back pain     Disc disease  . Pancreatic insufficiency     Diagnosed at [facility]  . Diabetes mellitus   . Coronary atherosclerosis of native coronary artery     Coronary calcifications by chest CT, Myoview demonstrating inferolateral scar  . Diastolic dysfunction 09/9796    Grade 1.  . Hyperkalemia     Past Surgical History  Procedure Date  . Prostatectomy 2011  . Hernia repair   . Appendectomy   . Cholecystectomy   . Bilateral hip replacement     Family History  Problem Relation Age of Onset  . Heart attack Father   . Diabetes type II Mother     History  Substance Use Topics  . Smoking status: Never Smoker   . Smokeless tobacco: Never Used  . Alcohol Use: No      Review of Systems  All other systems reviewed and are negative.   10 Systems reviewed and all are negative for acute change except as noted in the HPI.   Allergies  Review of  patient's allergies indicates no known allergies.  Home Medications   Current Outpatient Rx  Name  Route  Sig  Dispense  Refill  . ALBUTEROL SULFATE HFA 108 (90 BASE) MCG/ACT IN AERS   Inhalation   Inhale 1-2 puffs into the lungs every 6 (six) hours as needed for wheezing.   1 Inhaler   0   . ASPIRIN EC 81 MG PO TBEC   Oral   Take 81 mg by mouth at bedtime.          Marland Kitchen CARVEDILOL 25 MG PO TABS   Oral   Take 25 mg by mouth every morning.          Marland Kitchen CITALOPRAM HYDROBROMIDE 20 MG PO TABS   Oral   Take 20 mg by mouth daily.         Marland Kitchen DIAZEPAM 10 MG PO TABS   Oral   Take 10 mg by mouth 2 (two) times daily.          Marland Kitchen GABAPENTIN 300 MG PO CAPS   Oral   Take 600 mg by mouth at bedtime.         . GUAIFENESIN-DM 100-10 MG/5ML PO SYRP   Oral   Take 5 mLs by mouth 3 (three) times daily as needed. For cough         .  HYDROCHLOROTHIAZIDE 12.5 MG PO CAPS   Oral   Take 12.5 mg by mouth every morning.          . INSULIN ASPART PROT & ASPART (70-30) 100 UNIT/ML [place] SUSP   Subcutaneous   Inject 25-47 Units into the skin 2 (two) times daily. Patient takes 47 units in the morning and 25 units in the evening         . INSULIN REGULAR HUMAN 100 UNIT/ML IJ SOLN   Subcutaneous   Inject into the skin 3 (three) times daily before meals.         Marland Kitchen PANCRELIPASE (LIP-PROT-AMYL) 12000 UNITS PO CPEP   Oral   Take 1 capsule by mouth 3 (three) times daily before meals.         Marland Kitchen LOSARTAN POTASSIUM 100 MG PO TABS   Oral   Take 100 mg by mouth every morning.          Marland Kitchen METFORMIN HCL 1000 MG PO TABS   Oral   Take 1,000 mg by mouth 2 (two) times daily with a meal.         . ADULT MULTIVITAMIN W/MINERALS CH   Oral   Take 1 tablet by mouth daily.         Marland Kitchen PRAVASTATIN SODIUM 10 MG PO TABS   Oral   Take 10 mg by mouth at bedtime.          Marland Kitchen PROAIR HFA 108 (90 BASE) MCG/ACT IN AERS   Inhalation   Inhale 2 puffs into the lungs every 6 (six) hours as needed. For  shortness of breath           BP 170/81  Pulse 89  Temp 98.1 F (36.7 C) (Oral)  Resp 16  Ht 5' 7"  (1.702 m)  Wt 219 lb (99.338 kg)  BMI 34.30 kg/m2  SpO2 96%  Physical Exam  Nursing note and vitals reviewed. Constitutional: He is oriented to person, place, and time. He appears well-developed and well-nourished. No distress.  HENT:  Head: Normocephalic and atraumatic.  Eyes: EOM are normal. Pupils are equal, round, and reactive to light.  Neck: Normal range of motion. Neck supple. No tracheal deviation present.  Cardiovascular: Normal rate, regular rhythm and normal heart sounds.   Pulmonary/Chest: Effort normal and breath sounds normal. No respiratory distress.  Abdominal: Soft. He exhibits no distension.  Musculoskeletal: Normal range of motion.  Neurological: He is alert and oriented to person, place, and time.  Skin: Skin is warm and dry.  Psychiatric: He has a normal mood and affect. His behavior is normal.    ED Course  Procedures (including critical care time) DIAGNOSTIC STUDIES: Oxygen Saturation is 96% on room air, adequate by my interpretation.    COORDINATION OF CARE: 11:44 AM Discussed ED treatment with pt      Labs Reviewed  BASIC METABOLIC PANEL   Dg Chest 2 View  07/30/2012  *RADIOLOGY REPORT*  Clinical Data: Weakness and low blood pressure today.  Cough and congestion.  CHEST - 2 VIEW  Comparison: Chest radiograph 06/08/2012 and chest CT 10/10/2011  Findings: Moderate elevation of the right hemidiaphragm appears unchanged compared to prior chest CT. Numerous cardiac leads project over the chest.  Heart size appears upper normal and stable.  Mediastinal and hilar contours are within normal limits and stable.  There is mild right basilar atelectasis, overlying the elevated right hemidiaphragm.  Otherwise lungs are clear.  No acute osseous abnormality identified.  Cholecystectomy clips are  noted.  IMPRESSION: Stable moderate elevation of the right  hemidiaphragm and mild overlying right basilar atelectasis.  No acute cardiopulmonary disease identified.   Original Report Authenticated By: Curlene Dolphin, M.D.    Ct Head Wo Contrast  07/30/2012  *RADIOLOGY REPORT*  Clinical Data: Chest pain, cough, fatigue, sleepy  CT HEAD WITHOUT CONTRAST  Technique:  Contiguous axial images were obtained from the base of the skull through the vertex without contrast.  Comparison: None  Findings: Normal ventricular morphology. No midline shift or mass effect. Normal appearance of brain parenchyma. No intracranial hemorrhage, mass lesion or evidence of acute infarction. No extra-axial fluid collections. Question mucosal retention cyst right maxillary sinus. Bones unremarkable.  IMPRESSION: No acute intracranial abnormalities.   Original Report Authenticated By: Lavonia Dana, M.D.      No diagnosis found.    MDM  Patient returns for follow up of elevated K+, low blood pressure.  His bp is back up and the potassium is within the normal range.  We will hold his cozaar and resume his other meds.  To return prn and follow up with pcp in one week.      I personally performed the services described in this documentation, which was scribed in my presence. The recorded information has been reviewed and is accurate.          Veryl Speak, MD 07/31/12 1331

## 2012-09-19 ENCOUNTER — Other Ambulatory Visit: Payer: Self-pay

## 2012-09-26 ENCOUNTER — Encounter: Payer: Self-pay | Admitting: *Deleted

## 2012-10-22 ENCOUNTER — Telehealth: Payer: Self-pay | Admitting: Family Medicine

## 2012-10-22 MED ORDER — GLUCOSE BLOOD VI STRP: ORAL_STRIP | Status: DC

## 2012-10-22 NOTE — Telephone Encounter (Signed)
Done

## 2012-10-27 ENCOUNTER — Telehealth: Payer: Self-pay | Admitting: Family Medicine

## 2012-10-27 DIAGNOSIS — F419 Anxiety disorder, unspecified: Secondary | ICD-10-CM

## 2012-10-27 MED ORDER — DIAZEPAM 10 MG PO TABS: 10.0000 mg | ORAL_TABLET | Freq: Two times a day (BID) | ORAL | Status: DC

## 2012-10-27 NOTE — Telephone Encounter (Signed)
rx refilled.

## 2012-10-27 NOTE — Addendum Note (Signed)
Addended by: Olena Mater on: 10/27/2012 10:59 AM   Modules accepted: Orders

## 2012-10-27 NOTE — Telephone Encounter (Signed)
Ok to refill 

## 2012-10-27 NOTE — Telephone Encounter (Signed)
Medication refilled per protocol. 

## 2012-10-27 NOTE — Telephone Encounter (Signed)
Ok to call out 9

## 2012-12-17 ENCOUNTER — Other Ambulatory Visit: Payer: Self-pay | Admitting: Family Medicine

## 2012-12-17 NOTE — Telephone Encounter (Signed)
Ok to refill 

## 2012-12-17 NOTE — Telephone Encounter (Signed)
?   OK to Refill  

## 2012-12-21 ENCOUNTER — Telehealth: Payer: Self-pay | Admitting: Family Medicine

## 2012-12-22 MED ORDER — INSULIN SYRINGES (DISPOSABLE) U-100 0.5 ML MISC: 5.0000 | Freq: Every day | Status: DC

## 2012-12-22 NOTE — Telephone Encounter (Signed)
diab supply refill

## 2012-12-24 ENCOUNTER — Ambulatory Visit (INDEPENDENT_AMBULATORY_CARE_PROVIDER_SITE_OTHER): Payer: Medicare Other | Admitting: Family Medicine

## 2012-12-24 ENCOUNTER — Encounter: Payer: Self-pay | Admitting: Family Medicine

## 2012-12-24 VITALS — BP 120/64 | HR 85 | Temp 98.0°F | Resp 18 | Wt 217.0 lb

## 2012-12-24 DIAGNOSIS — E782 Mixed hyperlipidemia: Secondary | ICD-10-CM

## 2012-12-24 DIAGNOSIS — I10 Essential (primary) hypertension: Secondary | ICD-10-CM

## 2012-12-24 DIAGNOSIS — E119 Type 2 diabetes mellitus without complications: Secondary | ICD-10-CM

## 2012-12-24 MED ORDER — PRAVASTATIN SODIUM 10 MG PO TABS: 10.0000 mg | ORAL_TABLET | Freq: Every day | ORAL | Status: DC

## 2012-12-24 MED ORDER — GABAPENTIN 300 MG PO CAPS: 600.0000 mg | ORAL_CAPSULE | Freq: Every day | ORAL | Status: DC

## 2012-12-24 MED ORDER — HYDROCHLOROTHIAZIDE 12.5 MG PO CAPS: 12.5000 mg | ORAL_CAPSULE | Freq: Every morning | ORAL | Status: DC

## 2012-12-24 NOTE — Progress Notes (Signed)
Subjective:    Patient ID: [name], male    DOB: [birth year], 72 y.o.   MRN: [id number]  HPI Patient is here today for followup of his type 2 diabetes mellitus. He is currently taking NovoLog 7030, 47 units in the morning and 25 units at night. His fasting blood sugars in the morning are ranging from 50-100. He is having frequent episodes of hypoglycemia.   Sugars are frequently in the 50s and 60s. They're symptomatic. He denies any paresthesias, dysesthesias, or other signs of neuropathy in the feet. He takes gabapentin for nighttime cramps.  His bedtime sugars 2 hours after dinner are between 1:30 and 200. Most of them are under 170.  He also has hypertension. His medication list is reviewed. He denies any chest pain, shortness of breath, or dyspnea on exertion. He has history of coronary artery disease which means his goal LDL is less than 70.  He is currently taking pravastatin 10 mg by mouth daily. He denies any myalgias or right upper quadrant pain. He is due to have his fasting lipid panel checked. Past Medical History  Diagnosis Date  . Type 2 diabetes mellitus   . Essential hypertension, benign   . Prostate cancer 2011  . Mixed hyperlipidemia   . Chronic back pain     Disc disease  . Pancreatic insufficiency     Diagnosed at [facility]  . Diabetes mellitus   . Coronary atherosclerosis of native coronary artery     Coronary calcifications by chest CT, Myoview demonstrating inferolateral scar  . Diastolic dysfunction 02/3531    Grade 1.  . Hyperkalemia    Current Outpatient Prescriptions on File Prior to Visit  Medication Sig Dispense Refill  . albuterol (PROVENTIL HFA;VENTOLIN HFA) 108 (90 BASE) MCG/ACT inhaler Inhale 1-2 puffs into the lungs every 6 (six) hours as needed for wheezing.  1 Inhaler  0  . aspirin EC 81 MG tablet Take 81 mg by mouth at bedtime.       . carvedilol (COREG) 25 MG tablet Take 25 mg by mouth every morning.       . citalopram (CELEXA) 20 MG tablet Take  20 mg by mouth daily.      . diazepam (VALIUM) 5 MG tablet TAKE ONE TABLET BY MOUTH TWICE DAILY AS NEEDED  60 tablet  0  . glucose blood test strip Check blood sugar three times daily  100 each  prn  . insulin aspart protamine-insulin aspart (NOVOLOG 70/30) (70-30) 100 UNIT/ML injection Inject 25-47 Units into the skin 2 (two) times daily. Patient takes 47 units in the morning and 25 units in the evening      . Insulin Syringes, Disposable, U-100 0.5 ML MISC 5 each by Does not apply route 5 (five) times daily.  300 each  prn  . lipase/protease/amylase (CREON-10/PANCREASE) 12000 UNITS CPEP Take 1 capsule by mouth 3 (three) times daily before meals.      Marland Kitchen losartan (COZAAR) 100 MG tablet Take 100 mg by mouth every morning.       . metFORMIN (GLUCOPHAGE) 1000 MG tablet Take 1,000 mg by mouth 2 (two) times daily with a meal.      . Multiple Vitamin (MULTIVITAMIN WITH MINERALS) TABS Take 1 tablet by mouth daily.       No current facility-administered medications on file prior to visit.   Allergies  Allergen Reactions  . Enalapril Maleate Cough      Review of Systems  All other systems reviewed and are negative.  Objective:   Physical Exam  Vitals reviewed. Constitutional: He appears well-developed and well-nourished.  Neck: Neck supple. No thyromegaly present.  Cardiovascular: Normal rate, regular rhythm, normal heart sounds and intact distal pulses.   No murmur heard. Pulmonary/Chest: Effort normal and breath sounds normal. No respiratory distress. He has no wheezes. He has no rales.  Abdominal: Soft. Bowel sounds are normal. He exhibits no distension. There is no tenderness. There is no rebound.  Lymphadenopathy:    He has no cervical adenopathy.   diabetic foot exam is performed today        Assessment & Plan:  1. Type 2 diabetes mellitus Have asked the patient to decrease his 70/30 to 20 units at night in an effort to prevent the hypoglycemic episodes in the morning.   He is currently taking an aspirin every day. His foot exam is performed today. I recommended he get his annual eye exam. We'll check a hemoglobin A1c. The patient's goal A1c is less than 7. - Hemoglobin A1c  2. Essential hypertension, benign Pressures well controlled. Continue current medications - Basic Metabolic Panel  3. Mixed hyperlipidemia Check fasting lipid panel, goal LDL is less than 70 due to his history of coronary artery disease. - Hepatic Function Panel - Lipid Panel

## 2013-02-01 ENCOUNTER — Telehealth: Payer: Self-pay | Admitting: Family Medicine

## 2013-02-02 ENCOUNTER — Encounter: Payer: Self-pay | Admitting: Family Medicine

## 2013-02-02 MED ORDER — PANCRELIPASE (LIP-PROT-AMYL) 12000-38000 UNITS PO CPEP: 1.0000 | ORAL_CAPSULE | Freq: Three times a day (TID) | ORAL | Status: DC

## 2013-02-02 NOTE — Telephone Encounter (Signed)
This encounter was created in error - please disregard.

## 2013-02-02 NOTE — Telephone Encounter (Signed)
Med refilled.

## 2013-02-03 ENCOUNTER — Other Ambulatory Visit: Payer: Medicare Other

## 2013-02-03 LAB — HEPATIC FUNCTION PANEL
ALT: 46 U/L (ref 0–53)
AST: 30 U/L (ref 0–37)
Albumin: 3.8 g/dL (ref 3.5–5.2)
Alkaline Phosphatase: 125 U/L — ABNORMAL HIGH (ref 39–117)
Indirect Bilirubin: 0.3 mg/dL (ref 0.0–0.9)
Total Protein: 6.2 g/dL (ref 6.0–8.3)

## 2013-02-03 LAB — LIPID PANEL
HDL: 31 mg/dL — ABNORMAL LOW (ref >39)
LDL Cholesterol: 66 mg/dL (ref 0–99)

## 2013-02-03 LAB — HEMOGLOBIN A1C: Mean Plasma Glucose: 183 mg/dL — ABNORMAL HIGH (ref <117)

## 2013-02-03 LAB — BASIC METABOLIC PANEL
BUN: 31 mg/dL — ABNORMAL HIGH (ref 6–23)
Chloride: 107 mEq/L (ref 96–112)
Creat: 1.5 mg/dL — ABNORMAL HIGH (ref 0.50–1.35)
Glucose, Bld: 121 mg/dL — ABNORMAL HIGH (ref 70–99)
Potassium: 4.6 mEq/L (ref 3.5–5.3)

## 2013-03-04 ENCOUNTER — Telehealth: Payer: Self-pay | Admitting: Family Medicine

## 2013-03-05 MED ORDER — GLUCOSE BLOOD VI STRP: ORAL_STRIP | Status: DC

## 2013-03-05 NOTE — Telephone Encounter (Signed)
.  Rx Refilled per pharm insturctions

## 2013-03-10 ENCOUNTER — Encounter: Payer: Self-pay | Admitting: Family Medicine

## 2013-03-10 ENCOUNTER — Ambulatory Visit (INDEPENDENT_AMBULATORY_CARE_PROVIDER_SITE_OTHER): Payer: Medicare Other | Admitting: Family Medicine

## 2013-03-10 ENCOUNTER — Other Ambulatory Visit: Payer: Self-pay

## 2013-03-10 VITALS — BP 122/82 | HR 84 | Temp 98.4°F | Resp 16 | Wt 215.0 lb

## 2013-03-10 DIAGNOSIS — E119 Type 2 diabetes mellitus without complications: Secondary | ICD-10-CM

## 2013-03-10 NOTE — Progress Notes (Signed)
Subjective:    Patient ID: [name], male    DOB: [birth year], 72 y.o.   MRN: [id number]  HPI Patient's hemoglobin A1c July 2 was found to be 8.0. He is currently taking metformin 1000 mg by mouth twice a day. He is also taking 70/30 Novolin 47 units in the morning and 25 units at night. Over the last month he is really try to decrease the carbs in his diet. Unfortunately his fasting sugars have now dropped as low as 41. His fasting sugars range from 70-100. His postprandial sugars range from 100-200 generally less than 160. He is having symptomatic hypoglycemic episodes. He denies polyuria, polydipsia, or blurred vision. It appears as though he is overcorrected on his diet and his drastically limiting his carbs. Past Medical History  Diagnosis Date  . Type 2 diabetes mellitus   . Essential hypertension, benign   . Prostate cancer 2011  . Mixed hyperlipidemia   . Chronic back pain     Disc disease  . Pancreatic insufficiency     Diagnosed at [facility]  . Diabetes mellitus   . Coronary atherosclerosis of native coronary artery     Coronary calcifications by chest CT, Myoview demonstrating inferolateral scar  . Diastolic dysfunction 12/3974    Grade 1.  . Hyperkalemia    Current Outpatient Prescriptions on File Prior to Visit  Medication Sig Dispense Refill  . albuterol (PROVENTIL HFA;VENTOLIN HFA) 108 (90 BASE) MCG/ACT inhaler Inhale 1-2 puffs into the lungs every 6 (six) hours as needed for wheezing.  1 Inhaler  0  . aspirin EC 81 MG tablet Take 81 mg by mouth at bedtime.       . carvedilol (COREG) 25 MG tablet Take 25 mg by mouth every morning.       . citalopram (CELEXA) 20 MG tablet Take 20 mg by mouth daily.      . diazepam (VALIUM) 5 MG tablet TAKE ONE TABLET BY MOUTH TWICE DAILY AS NEEDED  60 tablet  0  . gabapentin (NEURONTIN) 300 MG capsule Take 2 capsules (600 mg total) by mouth at bedtime.  180 capsule  3  . glucose blood test strip Check blood sugar four times daily-  Fasting and before each meal - DX: 250.00  100 each  prn  . hydrochlorothiazide (MICROZIDE) 12.5 MG capsule Take 1 capsule (12.5 mg total) by mouth every morning.  90 capsule  3  . insulin aspart protamine-insulin aspart (NOVOLOG 70/30) (70-30) 100 UNIT/ML injection Inject 25-47 Units into the skin 2 (two) times daily. Patient takes 47 units in the morning and 25 units in the evening      . Insulin Syringes, Disposable, U-100 0.5 ML MISC 5 each by Does not apply route 5 (five) times daily.  300 each  prn  . lipase/protease/amylase (CREON-12/PANCREASE) 12000 UNITS CPEP Take 1 capsule by mouth 3 (three) times daily before meals.  270 capsule  1  . losartan (COZAAR) 100 MG tablet Take 100 mg by mouth every morning.       . metFORMIN (GLUCOPHAGE) 1000 MG tablet Take 1,000 mg by mouth 2 (two) times daily with a meal.      . Multiple Vitamin (MULTIVITAMIN WITH MINERALS) TABS Take 1 tablet by mouth daily.      . pravastatin (PRAVACHOL) 10 MG tablet Take 1 tablet (10 mg total) by mouth at bedtime.  90 tablet  3   No current facility-administered medications on file prior to visit.   Allergies  Allergen Reactions  .  Enalapril Maleate Cough   History   Social History  . Marital Status: Married    Spouse Name: N/A    Number of Children: N/A  . Years of Education: N/A   Occupational History  . Not on file.   Social History Main Topics  . Smoking status: Never Smoker   . Smokeless tobacco: Never Used  . Alcohol Use: No  . Drug Use: No  . Sexually Active: Not on file   Other Topics Concern  . Not on file   Social History Narrative  . No narrative on file      Review of Systems  All other systems reviewed and are negative.       Objective:   Physical Exam  Vitals reviewed. Constitutional: He appears well-developed and well-nourished.  Cardiovascular: Normal rate, regular rhythm and normal heart sounds.  Exam reveals no friction rub.   No murmur heard. Pulmonary/Chest: Effort  normal and breath sounds normal. No respiratory distress. He has no wheezes. He has no rales.  Abdominal: Soft. Bowel sounds are normal. He exhibits no distension. There is no tenderness. There is no rebound.  Musculoskeletal: He exhibits no edema.          Assessment & Plan:  1. Type 2 diabetes mellitus Decreased Novolin 70/30 to 47 units in the morning and only 20 units at night. Tried the 45 g of carbohydrates with each meal and also add a 15 g carb snack at night with bed.  Recheck fasting blood sugar and two-hour postprandial sugars in 2 weeks.  Even though his A1c was elevated at July 2, I believe he is now overcorrected with his diet and does not require insulin at this time due to the risk of hypoglycemia.

## 2013-03-19 ENCOUNTER — Encounter: Payer: Self-pay | Admitting: Family Medicine

## 2013-03-19 ENCOUNTER — Ambulatory Visit (INDEPENDENT_AMBULATORY_CARE_PROVIDER_SITE_OTHER): Payer: Medicare Other | Admitting: Family Medicine

## 2013-03-19 VITALS — BP 130/70 | HR 82 | Temp 98.4°F | Resp 18 | Wt 216.0 lb

## 2013-03-19 DIAGNOSIS — N200 Calculus of kidney: Secondary | ICD-10-CM

## 2013-03-19 DIAGNOSIS — R05 Cough: Secondary | ICD-10-CM

## 2013-03-19 DIAGNOSIS — E109 Type 1 diabetes mellitus without complications: Secondary | ICD-10-CM

## 2013-03-19 MED ORDER — OXYCODONE-ACETAMINOPHEN 5-325 MG PO TABS: 2.0000 | ORAL_TABLET | ORAL | Status: DC | PRN

## 2013-03-19 MED ORDER — AZITHROMYCIN 250 MG PO TABS: ORAL_TABLET | ORAL | Status: DC

## 2013-03-19 NOTE — Progress Notes (Signed)
Subjective:    Patient ID: [name], male    DOB: 05/14/1941, 72 y.o.   MRN: [id number]  HPI Patient [place] the beach in [facility].  Developed acute onset of left-sided low back pain and hematuria. Was seen at the emergency room. There he was discovered to have multiple renal calculi at the level of the renal pelvis in the left kidney, he also had several obstructive stones in the left ureter at the level of the pelvic inlet the largest of which was 6 mm. He also had left-sided hydronephrosis and hydroureter. He has been on Flomax and Percocet to last 3 days. The pain is not improving. The pain has not moved. He did pass one 3 mm kidney stone over the last 3 days but that is all.  He is here today for followup.  He also has insulin-dependent diabetes mellitus. He is currently on Novolog 7030, he takes 47 units in the morning and 20 units in the evening. His blood sugars for the most part are good except he has had an occasional early morning hypoglycemic event down to the level of 60 and one time down to the level of 38.  He also reports one week of cough but is nonproductive. He has no fevers or chills. However on his exam he has right basilar crackles. Past Medical History  Diagnosis Date  . Type 2 diabetes mellitus   . Essential hypertension, benign   . Prostate cancer 2011  . Mixed hyperlipidemia   . Chronic back pain     Disc disease  . Pancreatic insufficiency     Diagnosed at [facility]  . Diabetes mellitus   . Coronary atherosclerosis of native coronary artery     Coronary calcifications by chest CT, Myoview demonstrating inferolateral scar  . Diastolic dysfunction 11/3886    Grade 1.  . Hyperkalemia    Past Surgical History  Procedure Laterality Date  . Prostatectomy  2011  . Hernia repair    . Appendectomy    . Cholecystectomy    . Bilateral hip replacement     Current Outpatient Prescriptions on File Prior to Visit  Medication Sig Dispense Refill  . albuterol  (PROVENTIL HFA;VENTOLIN HFA) 108 (90 BASE) MCG/ACT inhaler Inhale 1-2 puffs into the lungs every 6 (six) hours as needed for wheezing.  1 Inhaler  0  . aspirin EC 81 MG tablet Take 81 mg by mouth at bedtime.       . carvedilol (COREG) 25 MG tablet Take 25 mg by mouth every morning.       . citalopram (CELEXA) 20 MG tablet Take 20 mg by mouth daily.      . diazepam (VALIUM) 5 MG tablet TAKE ONE TABLET BY MOUTH TWICE DAILY AS NEEDED  60 tablet  0  . gabapentin (NEURONTIN) 300 MG capsule Take 2 capsules (600 mg total) by mouth at bedtime.  180 capsule  3  . glucose blood test strip Check blood sugar four times daily- Fasting and before each meal - DX: 250.00  100 each  prn  . hydrochlorothiazide (MICROZIDE) 12.5 MG capsule Take 1 capsule (12.5 mg total) by mouth every morning.  90 capsule  3  . insulin aspart protamine-insulin aspart (NOVOLOG 70/30) (70-30) 100 UNIT/ML injection Inject 25-47 Units into the skin 2 (two) times daily. Patient takes 47 units in the morning and 20 units in the evening      . Insulin Syringes, Disposable, U-100 0.5 ML MISC 5 each by Does  not apply route 5 (five) times daily.  300 each  prn  . lipase/protease/amylase (CREON-12/PANCREASE) 12000 UNITS CPEP Take 1 capsule by mouth 3 (three) times daily before meals.  270 capsule  1  . metFORMIN (GLUCOPHAGE) 1000 MG tablet Take 1,000 mg by mouth 2 (two) times daily with a meal.      . Multiple Vitamin (MULTIVITAMIN WITH MINERALS) TABS Take 1 tablet by mouth daily.      . pravastatin (PRAVACHOL) 10 MG tablet Take 1 tablet (10 mg total) by mouth at bedtime.  90 tablet  3   No current facility-administered medications on file prior to visit.   Allergies  Allergen Reactions  . Enalapril Maleate Cough   History   Social History  . Marital Status: Married    Spouse Name: N/A    Number of Children: N/A  . Years of Education: N/A   Occupational History  . Not on file.   Social History Main Topics  . Smoking status: Never  Smoker   . Smokeless tobacco: Never Used  . Alcohol Use: No  . Drug Use: No  . Sexual Activity: Not on file   Other Topics Concern  . Not on file   Social History Narrative  . No narrative on file      Review of Systems  All other systems reviewed and are negative.       Objective:   Physical Exam  Vitals reviewed. Cardiovascular: Normal rate, regular rhythm and normal heart sounds.   No murmur heard. Pulmonary/Chest: Effort normal. No respiratory distress. He has no wheezes. He has rales.  Abdominal: Soft. Bowel sounds are normal. He exhibits no distension (Right lower lobe crackles). There is no tenderness. There is no rebound.          Assessment & Plan:  1. Nephrolithiasis I am concerned the patient has an obstructive kidney stone that is borderline to be spontaneously passed at 6 mm. He has left hydronephrosis and left hydroureter. Therefore I'm going to consult urology for evaluation.  He also has a history of chronic kidney disease. Therefore I'm going to have him hold his metformin and his hydrochlorothiazide and until the hydronephrosis is resolved.  2. Cough Exam is concerning for possible community-acquired pneumonia. I will begin the patient on Zithromax 500 mg by mouth daily 1 and 250 mg by mouth daily 2 through 5 recheck next week.  3. Diabetes mellitus, insulin dependent (IDDM), controlled Decrease insulin dose at night to 15 units subcutaneous hypoglycemic events.

## 2013-03-20 ENCOUNTER — Emergency Department (HOSPITAL_COMMUNITY)
Admission: EM | Admit: 2013-03-20 | Discharge: 2013-03-20 | Discharge status: Against Medical Advice | Payer: Medicare Other | Attending: Emergency Medicine | Admitting: Emergency Medicine

## 2013-03-20 ENCOUNTER — Emergency Department (HOSPITAL_COMMUNITY): Payer: Medicare Other

## 2013-03-20 ENCOUNTER — Encounter (HOSPITAL_COMMUNITY): Payer: Self-pay | Admitting: *Deleted

## 2013-03-20 DIAGNOSIS — R059 Cough, unspecified: Secondary | ICD-10-CM | POA: Insufficient documentation

## 2013-03-20 DIAGNOSIS — I1310 Hypertensive heart and chronic kidney disease without heart failure, with stage 1 through stage 4 chronic kidney disease, or unspecified chronic kidney disease: Secondary | ICD-10-CM | POA: Insufficient documentation

## 2013-03-20 DIAGNOSIS — I2510 Atherosclerotic heart disease of native coronary artery without angina pectoris: Secondary | ICD-10-CM | POA: Insufficient documentation

## 2013-03-20 DIAGNOSIS — Z87442 Personal history of urinary calculi: Secondary | ICD-10-CM | POA: Insufficient documentation

## 2013-03-20 DIAGNOSIS — N201 Calculus of ureter: Secondary | ICD-10-CM

## 2013-03-20 DIAGNOSIS — E119 Type 2 diabetes mellitus without complications: Secondary | ICD-10-CM | POA: Insufficient documentation

## 2013-03-20 DIAGNOSIS — K8689 Other specified diseases of pancreas: Secondary | ICD-10-CM | POA: Insufficient documentation

## 2013-03-20 DIAGNOSIS — R05 Cough: Secondary | ICD-10-CM | POA: Insufficient documentation

## 2013-03-20 DIAGNOSIS — R062 Wheezing: Secondary | ICD-10-CM | POA: Insufficient documentation

## 2013-03-20 DIAGNOSIS — Z8546 Personal history of malignant neoplasm of prostate: Secondary | ICD-10-CM | POA: Insufficient documentation

## 2013-03-20 DIAGNOSIS — N289 Disorder of kidney and ureter, unspecified: Secondary | ICD-10-CM

## 2013-03-20 DIAGNOSIS — E782 Mixed hyperlipidemia: Secondary | ICD-10-CM | POA: Insufficient documentation

## 2013-03-20 DIAGNOSIS — R509 Fever, unspecified: Secondary | ICD-10-CM

## 2013-03-20 HISTORY — DX: Calculus of kidney: N20.0

## 2013-03-20 HISTORY — DX: Chronic kidney disease, unspecified: N18.9

## 2013-03-20 LAB — CBC WITH DIFFERENTIAL/PLATELET
Basophils Absolute: 0 10*3/uL (ref 0.0–0.1)
Eosinophils Relative: 2 % (ref 0–5)
HCT: 36 % — ABNORMAL LOW (ref 39.0–52.0)
Hemoglobin: 11.4 g/dL — ABNORMAL LOW (ref 13.0–17.0)
Lymphocytes Relative: 10 % — ABNORMAL LOW (ref 12–46)
Lymphs Abs: 1.1 10*3/uL (ref 0.7–4.0)
MCV: 95.7 fL (ref 78.0–100.0)
Monocytes Absolute: 0.8 10*3/uL (ref 0.1–1.0)
Monocytes Relative: 8 % (ref 3–12)
Neutro Abs: 8.1 10*3/uL — ABNORMAL HIGH (ref 1.7–7.7)
RBC: 3.76 MIL/uL — ABNORMAL LOW (ref 4.22–5.81)
RDW: 13.6 % (ref 11.5–15.5)
WBC: 10.2 10*3/uL (ref 4.0–10.5)

## 2013-03-20 LAB — URINALYSIS, ROUTINE W REFLEX MICROSCOPIC
Bilirubin Urine: NEGATIVE
Glucose, UA: 250 mg/dL — AB
Leukocytes, UA: NEGATIVE
Nitrite: NEGATIVE
Specific Gravity, Urine: 1.02 (ref 1.005–1.030)
pH: 6 (ref 5.0–8.0)

## 2013-03-20 LAB — LACTIC ACID, PLASMA: Lactic Acid, Venous: 0.8 mmol/L (ref 0.5–2.2)

## 2013-03-20 LAB — URINE MICROSCOPIC-ADD ON

## 2013-03-20 LAB — BASIC METABOLIC PANEL
CO2: 24 mEq/L (ref 19–32)
Calcium: 8.4 mg/dL (ref 8.4–10.5)
Chloride: 104 mEq/L (ref 96–112)
Creatinine, Ser: 2.61 mg/dL — ABNORMAL HIGH (ref 0.50–1.35)
Glucose, Bld: 307 mg/dL — ABNORMAL HIGH (ref 70–99)

## 2013-03-20 MED ORDER — ACETAMINOPHEN 500 MG PO TABS
1000.0000 mg | ORAL_TABLET | Freq: Once | ORAL | Status: AC
  Administered 2013-03-20: 1000 mg via ORAL
  Filled 2013-03-20: qty 2

## 2013-03-20 MED ORDER — ALBUTEROL SULFATE (5 MG/ML) 0.5% IN NEBU
5.0000 mg | INHALATION_SOLUTION | Freq: Once | RESPIRATORY_TRACT | Status: DC
  Filled 2013-03-20: qty 1

## 2013-03-20 MED ORDER — IPRATROPIUM BROMIDE 0.02 % IN SOLN
0.5000 mg | Freq: Once | RESPIRATORY_TRACT | Status: DC
  Filled 2013-03-20: qty 2.5

## 2013-03-20 NOTE — ED Provider Notes (Signed)
CSN: [id number]     Arrival date & time 03/20/13  1951 History     First MD Initiated Contact with Patient 03/20/13 2014     Chief Complaint  Patient presents with  . Back Pain  . Flank Pain  . Fever    HPI Pt was seen at 2020. Per pt and his wife, c/o gradual onset and persistence of constant home fever to "101.3" that began tonight PTA. Pt did not take any meds PTA. Pt states he was evaluated by his PMD yesterday in follow up for an OSH ED visit for left sided flank pain and hematuria 3 days ago. At that time, pt was dx with "several kidney stones," and was treated symptomatically. Pt states his PMD started him on zithromax yesterday for a persistent cough and occasional wheezing over the past several weeks. Currently denies any flank/back pain.  Denies CP/palpitations, no abd pain, no N/V/D, no dysuria/hematuria, no testicular pain/swelling, no rash.    Past Medical History  Diagnosis Date  . Type 2 diabetes mellitus   . Essential hypertension, benign   . Prostate cancer 2011  . Mixed hyperlipidemia   . Chronic back pain     Disc disease  . Pancreatic insufficiency     Diagnosed at [facility]  . Diabetes mellitus   . Coronary atherosclerosis of native coronary artery     Coronary calcifications by chest CT, Myoview demonstrating inferolateral scar  . Diastolic dysfunction 04/5620    Grade 1.  . Hyperkalemia   . Kidney stones   . Chronic kidney disease    Past Surgical History  Procedure Laterality Date  . Prostatectomy  2011  . Hernia repair    . Appendectomy    . Cholecystectomy    . Bilateral hip replacement     Family History  Problem Relation Age of Onset  . Heart attack Father   . Diabetes type II Mother    History  Substance Use Topics  . Smoking status: Never Smoker   . Smokeless tobacco: Never Used  . Alcohol Use: No    Review of Systems ROS: Statement: All systems negative except as marked or noted in the HPI; Constitutional: +fever and chills. ; ; Eyes:  Negative for eye pain, redness and discharge. ; ; ENMT: Negative for ear pain, hoarseness, nasal congestion, sinus pressure and sore throat. ; ; Cardiovascular: Negative for chest pain, palpitations, diaphoresis, dyspnea and peripheral edema. ; ; Respiratory: +cough, wheezing. Negative for stridor. ; ; Gastrointestinal: Negative for nausea, vomiting, diarrhea, abdominal pain, blood in stool, hematemesis, jaundice and rectal bleeding. . ; ; Genitourinary: +flank pain. Negative for dysuria and hematuria. ; ; Genital:  No penile drainage or rash, no testicular pain or swelling, no scrotal rash or swelling.;;; Musculoskeletal: Negative for back pain and neck pain. Negative for swelling and trauma.; ; Skin: Negative for pruritus, rash, abrasions, blisters, bruising and skin lesion.; ; Neuro: Negative for headache, lightheadedness and neck stiffness. Negative for weakness, altered level of consciousness , altered mental status, extremity weakness, paresthesias, involuntary movement, seizure and syncope.       Allergies  Enalapril maleate  Home Medications   Current Outpatient Rx  Name  Route  Sig  Dispense  Refill  . albuterol (PROVENTIL HFA;VENTOLIN HFA) 108 (90 BASE) MCG/ACT inhaler   Inhalation   Inhale 1-2 puffs into the lungs every 6 (six) hours as needed for wheezing.   1 Inhaler   0   . aspirin EC 81 MG tablet  Oral   Take 81 mg by mouth at bedtime.          Marland Kitchen azithromycin (ZITHROMAX) 250 MG tablet      2 tabs poqday 1,1 tab poqday 2-5   6 tablet   0   . carvedilol (COREG) 25 MG tablet   Oral   Take 25 mg by mouth 2 (two) times daily.          . cetirizine (ZYRTEC) 10 MG tablet   Oral   Take 10 mg by mouth daily.         . citalopram (CELEXA) 20 MG tablet   Oral   Take 20 mg by mouth daily.         . diazepam (VALIUM) 5 MG tablet   Oral   Take 5 mg by mouth 2 (two) times daily as needed for anxiety.         . docusate sodium (COLACE) 100 MG capsule   Oral    Take 100 mg by mouth daily.         Marland Kitchen gabapentin (NEURONTIN) 300 MG capsule   Oral   Take 2 capsules (600 mg total) by mouth at bedtime.   180 capsule   3   . hydrochlorothiazide (MICROZIDE) 12.5 MG capsule   Oral   Take 1 capsule (12.5 mg total) by mouth every morning.   90 capsule   3   . insulin aspart protamine-insulin aspart (NOVOLOG 70/30) (70-30) 100 UNIT/ML injection   Subcutaneous   Inject 15-47 Units into the skin 2 (two) times daily. Patient takes 47 units in the morning and 15 units in the evening         . lipase/protease/amylase (CREON-12/PANCREASE) 12000 UNITS CPEP   Oral   Take 1 capsule by mouth 3 (three) times daily before meals.   270 capsule   1   . metFORMIN (GLUCOPHAGE) 1000 MG tablet   Oral   Take 1,000 mg by mouth 2 (two) times daily with a meal.         . Multiple Vitamin (MULTIVITAMIN WITH MINERALS) TABS   Oral   Take 1 tablet by mouth daily.         Marland Kitchen oxyCODONE-acetaminophen (PERCOCET/ROXICET) 5-325 MG per tablet   Oral   Take 2 tablets by mouth every 4 (four) hours as needed for pain.   60 tablet   0   . pravastatin (PRAVACHOL) 10 MG tablet   Oral   Take 1 tablet (10 mg total) by mouth at bedtime.   90 tablet   3   . tamsulosin (FLOMAX) 0.4 MG CAPS capsule   Oral   Take 0.4 mg by mouth daily.           BP 156/69  Pulse 100  Temp(Src) 100 F (37.8 C) (Rectal)  Resp 20  Ht 5' 10"  (1.778 m)  Wt 216 lb (97.977 kg)  BMI 30.99 kg/m2  SpO2 88% Physical Exam 2025: Physical examination:  Nursing notes reviewed; Vital signs and O2 SAT reviewed;  Constitutional: Well developed, Well nourished, Well hydrated, In no acute distress; Head:  Normocephalic, atraumatic; Eyes: EOMI, PERRL, No scleral icterus; ENMT: Mouth and pharynx normal, Mucous membranes moist; Neck: Supple, No meningeal signs. Full range of motion, No lymphadenopathy; Cardiovascular: Regular rate and rhythm, No gallop; Respiratory: Breath sounds diminished & equal  bilaterally, coarse with scattered wheezes. No audible wheezing. +non-productive cough during exam. Speaking full sentences, Normal respiratory effort/excursion; Chest: Nontender, Movement normal; Abdomen: Soft, Nontender,  Nondistended, Normal bowel sounds; Genitourinary: No CVA tenderness; Extremities: Pulses normal, No tenderness, No edema, No calf edema or asymmetry.; Neuro: AA&Ox3, Major CN grossly intact.  Speech clear. No gross focal motor or sensory deficits in extremities.; Skin: Color normal, Warm, Dry.   ED Course   Procedures     MDM  MDM Reviewed: previous chart, nursing note and vitals Reviewed previous: labs Interpretation: labs, x-ray and CT scan   Results for orders placed during the hospital encounter of 03/20/13  CBC WITH DIFFERENTIAL      Result Value Range   WBC 10.2  4.0 - 10.5 K/uL   RBC 3.76 (*) 4.22 - 5.81 MIL/uL   Hemoglobin 11.4 (*) 13.0 - 17.0 g/dL   HCT 36.0 (*) 39.0 - 52.0 %   MCV 95.7  78.0 - 100.0 fL   MCH 30.3  26.0 - 34.0 pg   MCHC 31.7  30.0 - 36.0 g/dL   RDW 13.6  11.5 - 15.5 %   Platelets 181  150 - 400 K/uL   Neutrophils Relative % 80 (*) 43 - 77 %   Neutro Abs 8.1 (*) 1.7 - 7.7 K/uL   Lymphocytes Relative 10 (*) 12 - 46 %   Lymphs Abs 1.1  0.7 - 4.0 K/uL   Monocytes Relative 8  3 - 12 %   Monocytes Absolute 0.8  0.1 - 1.0 K/uL   Eosinophils Relative 2  0 - 5 %   Eosinophils Absolute 0.2  0.0 - 0.7 K/uL   Basophils Relative 0  0 - 1 %   Basophils Absolute 0.0  0.0 - 0.1 K/uL  BASIC METABOLIC PANEL      Result Value Range   Sodium 136  135 - 145 mEq/L   Potassium 4.6  3.5 - 5.1 mEq/L   Chloride 104  96 - 112 mEq/L   CO2 24  19 - 32 mEq/L   Glucose, Bld 307 (*) 70 - 99 mg/dL   BUN 49 (*) 6 - 23 mg/dL   Creatinine, Ser 2.61 (*) 0.50 - 1.35 mg/dL   Calcium 8.4  8.4 - 10.5 mg/dL   GFR calc non Af Amer 23 (*) >90 mL/min   GFR calc Af Amer 27 (*) >90 mL/min  LACTIC ACID, PLASMA      Result Value Range   Lactic Acid, Venous 0.8  0.5 -  2.2 mmol/L  URINALYSIS, ROUTINE W REFLEX MICROSCOPIC      Result Value Range   Color, Urine YELLOW  YELLOW   APPearance CLEAR  CLEAR   Specific Gravity, Urine 1.020  1.005 - 1.030   pH 6.0  5.0 - 8.0   Glucose, UA 250 (*) NEGATIVE mg/dL   Hgb urine dipstick LARGE (*) NEGATIVE   Bilirubin Urine NEGATIVE  NEGATIVE   Ketones, ur TRACE (*) NEGATIVE mg/dL   Protein, ur TRACE (*) NEGATIVE mg/dL   Urobilinogen, UA 0.2  0.0 - 1.0 mg/dL   Nitrite NEGATIVE  NEGATIVE   Leukocytes, UA NEGATIVE  NEGATIVE  URINE MICROSCOPIC-ADD ON      Result Value Range   WBC, UA 3-6  <3 WBC/hpf   RBC / HPF 21-50  <3 RBC/hpf   Bacteria, UA FEW (*) RARE   Ct Abdomen Pelvis Wo Contrast 03/20/2013   *RADIOLOGY REPORT*  Clinical Data: Fever with flank pain  CT ABDOMEN AND PELVIS WITHOUT CONTRAST  Technique:  Multidetector CT imaging of the abdomen and pelvis was performed following the standard protocol without oral or intravenous  contrast.  Comparison: None.  Findings: There is consolidation in both lung bases, more on the right than on the left.  There is fatty change throughout the liver.  No focal liver lesions are identified on this noncontrast enhanced study.  Gallbladder is absent.  There is no biliary duct dilatation.  Spleen appears normal.  No pancreatic lesions are identified. Pancreas appears markedly atrophic.  There are calcifications in the region of the pancreas suggesting residua of chronic pancreatitis.  There is a 1.5 x 1.2 cm left adrenal mass.  There is a 1.3 x 1.1 cm right adrenal mass.  The right kidney shows no mass, calculus, or hydronephrosis. No right ureteral calculus seen.  The left kidney demonstrates a 5 x 3 mm calculus in the midportion. There is moderate hydronephrosis on the left.  There is no left renal mass.  There is ureterectasis on the left. There is extensive artifact in the pelvis from total hip replacements making evaluation of the distal ureteral region on the left difficult.  On  coronal slice 88, there is a suggestion of a 2 mm calculus in the region of the distal left ureter.  In the pelvis, as stated above, there is artifact from the total hip replacements.  No pelvic mass or fluid is seen.  The patient is status post appendectomy.  There is no bowel obstruction.  No free air or portal venous air. There is no ascites, adenopathy, or abscess in the pelvis.  There is atherosclerotic change in the aorta but no aneurysm.  There is degenerative change in the lower thoracic and lumbar spine. Vacuum phenomenon is noted at the L4-5 and L5 - S1.  There are no blastic or lytic bone lesions.  IMPRESSION: Moderate hydronephrosis on the left.  Suspect 2 mm calculus in the distal left ureter, seen only on coronal slice 88.  On axial images, artifact from total hip prosthesis degrades image quality to the point that assessment cannot be made on axial images in this region.  Bibasilar lung infiltrates seen, more on the right than on the left.  Fatty liver.  Pancreas is markedly atrophic.  Evidence of chronic pancreatitis is present with calcifications in portions of remaining pancreatic tissue.  Small adrenal masses bilaterally, probably representing small adenomas.  Nonobstructing calculus mid left kidney.  Extensive arthropathy in the lumbar spine.  There is fatty change in the liver.  Gallbladder and appendix are absent.   Original Report Authenticated By: Lowella Grip, M.D.   Dg Chest 2 View 03/20/2013   *RADIOLOGY REPORT*  Clinical Data: Back pain, flank pain  CHEST - 2 VIEW  Comparison: 07/30/2012  Findings: Cardiomediastinal silhouette is stable.  Elevation of the right hemidiaphragm again noted.  No acute infiltrate or pulmonary edema.  Stable right basilar atelectasis.  IMPRESSION:  Elevation of the right hemidiaphragm again noted.  No acute infiltrate or pulmonary edema.  Stable right basilar atelectasis.   Original Report Authenticated By: Lahoma Crocker, M.D.    Results for MIROSLAV, GIN (MRN [id number]) as of 03/20/2013 21:25  Ref. Range 07/30/2012 12:15 07/31/2012 11:31 02/03/2013 09:52 03/20/2013 20:37  BUN Latest Range: 6-23 mg/dL 37 (H) 25 (H) 31 (H) 49 (H)  Creatinine Latest Range: 0.50-1.35 mg/dL 1.78 (H) 1.42 (H) 1.50 (H) 2.61 (H)     Results for ZAYVIAN, MCMURTRY (MRN [id number]) as of 03/20/2013 21:25  Ref. Range 07/30/2012 12:15 03/20/2013 20:37  Hemoglobin Latest Range: 13.0-17.0 g/dL 11.5 (L) 11.4 (L)  HCT Latest Range: 39.0-52.0 % 36.4 (L)  36.0 (L)     2030:  Pt states on arrival that he "doesn't want to be here" and "isn't staying anyway." D/W pt and his wife by myself and ED RN, pt is agreeable to have workup completed.   2130:  CXR without infiltrate. Udip without clear infection; UC pending. CT scan with 2 ureteral calculi. These results given to pt and his wife. Pt and his wife aware of ureteral calculi.  Pt now refusing neb treatment, Sats 95% R/A. SBP stable. WBC count and lactic acid level normal. States he feels "better" after tylenol and wants to go home now.  Pt and family informed re: dx testing results, including worsening renal function tests from baseline, as well as low O2 Sats/wheezing/cough, and that I recommend admission for further evaluation.  Pt and his wife immediately replied "oh we're not staying." Pt and wife asked if he would like the neb treatment.  Pt and his wife both refuse any further ED treatment as well as admission and want to leave now. States he has already started taking abx and has Uro MD appt on Monday.  I encouraged pt to stay, continues to refuse.  Pt makes his own medical decisions.  Risks of AMA explained to pt and family, including, but not limited to:  Renal failure, sepsis, stroke, heart attack, cardiac arrythmia ("irregular heart rate/beat"), "passing out," temporary and/or permanent disability, death.  Pt and family verb understanding and continue to refuse admission, understanding the consequences of their decision.  I  encouraged pt to follow up with his Urologist on Monday as previously scheduled and return to the ED immediately if symptoms return, worsen, they change their mind, or for any other concerns.  Pt and family verb understanding, agreeable.     Alfonzo Feller, DO 03/23/13 1141

## 2013-03-20 NOTE — ED Notes (Signed)
Pt was seen on Wednesday at [facility] and was told he had 34m stone and several more and to go to the nearest hospital if he began running a temperature.

## 2013-03-22 LAB — URINE CULTURE

## 2013-03-25 ENCOUNTER — Encounter: Payer: Self-pay | Admitting: Family Medicine

## 2013-03-25 ENCOUNTER — Ambulatory Visit (INDEPENDENT_AMBULATORY_CARE_PROVIDER_SITE_OTHER): Payer: Medicare Other | Admitting: Family Medicine

## 2013-03-25 VITALS — BP 160/90 | HR 80 | Temp 98.1°F | Resp 22 | Wt 212.0 lb

## 2013-03-25 DIAGNOSIS — N179 Acute kidney failure, unspecified: Secondary | ICD-10-CM

## 2013-03-25 DIAGNOSIS — I10 Essential (primary) hypertension: Secondary | ICD-10-CM

## 2013-03-25 DIAGNOSIS — N200 Calculus of kidney: Secondary | ICD-10-CM

## 2013-03-25 DIAGNOSIS — N1330 Unspecified hydronephrosis: Secondary | ICD-10-CM

## 2013-03-25 MED ORDER — AMLODIPINE BESYLATE 10 MG PO TABS: 10.0000 mg | ORAL_TABLET | Freq: Every day | ORAL | Status: DC

## 2013-03-25 NOTE — Progress Notes (Signed)
Subjective:    Patient ID: [name], male    DOB: 08/27/1940, 72 y.o.   MRN: [id number]  HPI 03/19/13 Patient was at the beach in [place].  Developed acute onset of left-sided low back pain and hematuria. Was seen at the emergency room. There he was discovered to have multiple renal calculi at the level of the renal pelvis in the left kidney, he also had several obstructive stones in the left ureter at the level of the pelvic inlet the largest of which was 6 mm. He also had left-sided hydronephrosis and hydroureter. He has been on Flomax and Percocet to last 3 days. The pain is not improving. The pain has not moved. He did pass one 3 mm kidney stone over the last 3 days but that is all.  He is here today for followup.  He also has insulin-dependent diabetes mellitus. He is currently on Novolog 7030, he takes 47 units in the morning and 20 units in the evening. His blood sugars for the most part are good except he has had an occasional early morning hypoglycemic event down to the level of 60 and one time down to the level of 38.  He also reports one week of cough but is nonproductive. He has no fevers or chills. However on his exam he has right basilar crackles.  At that time, my plan was:1. Nephrolithiasis I am concerned the patient has an obstructive kidney stone that is borderline to be spontaneously passed at 6 mm. He has left hydronephrosis and left hydroureter. Therefore I'm going to consult urology for evaluation.  He also has a history of chronic kidney disease. Therefore I'm going to have him hold his metformin and his hydrochlorothiazide and until the hydronephrosis is resolved.  2. Cough Exam is concerning for possible community-acquired pneumonia. I will begin the patient on Zithromax 500 mg by mouth daily 1 and 250 mg by mouth daily 2 through 5 recheck next week.  3. Diabetes mellitus, insulin dependent (IDDM), controlled Decrease insulin dose at night to 15 units  subcutaneous hypoglycemic events.  03/25/13 My office called urology that day. Their Scheduler stated that they would get him appointment for the following Monday and they will call the patient. Unfortunately, that never happened and the patient has not seen urology.  Also, unfortunately he developed a fever to 101 over the weekend. He went to the emergency room as I recommended. There was found to have moderate left hydronephrosis, hydroureter, and a 2 mm obstructing kidney stone in the distal left ureter. He also had acute renal failure with her worsening of his creatinine from 1.5-2.6.  The CT scan also revealed by basilar infiltrates.  The patient left the emergency room AMA.  Patient states that he has not had any further flank pain since Saturday. He denies any hematuria. He has not had any further fever. His cough has subsided substantially. Unfortunately his blood pressure remains elevated at 160/90. This is because we had him discontinue his hydrochlorothiazide and metformin at the last office visit.   Past Medical History  Diagnosis Date  . Type 2 diabetes mellitus   . Essential hypertension, benign   . Prostate cancer 2011  . Mixed hyperlipidemia   . Chronic back pain     Disc disease  . Pancreatic insufficiency     Diagnosed at [facility]  . Diabetes mellitus   . Coronary atherosclerosis of native coronary artery     Coronary calcifications by chest CT, Myoview demonstrating inferolateral scar  .  Diastolic dysfunction 02/6282    Grade 1.  . Hyperkalemia   . Kidney stones   . Chronic kidney disease    Past Surgical History  Procedure Laterality Date  . Prostatectomy  2011  . Hernia repair    . Appendectomy    . Cholecystectomy    . Bilateral hip replacement     Current Outpatient Prescriptions on File Prior to Visit  Medication Sig Dispense Refill  . albuterol (PROVENTIL HFA;VENTOLIN HFA) 108 (90 BASE) MCG/ACT inhaler Inhale 1-2 puffs into the lungs every 6 (six) hours as  needed for wheezing.  1 Inhaler  0  . aspirin EC 81 MG tablet Take 81 mg by mouth at bedtime.       . carvedilol (COREG) 25 MG tablet Take 25 mg by mouth 2 (two) times daily.       . cetirizine (ZYRTEC) 10 MG tablet Take 10 mg by mouth daily.      . citalopram (CELEXA) 20 MG tablet Take 20 mg by mouth daily.      . diazepam (VALIUM) 5 MG tablet Take 5 mg by mouth 2 (two) times daily as needed for anxiety.      . docusate sodium (COLACE) 100 MG capsule Take 100 mg by mouth daily.      Marland Kitchen gabapentin (NEURONTIN) 300 MG capsule Take 2 capsules (600 mg total) by mouth at bedtime.  180 capsule  3  . insulin aspart protamine-insulin aspart (NOVOLOG 70/30) (70-30) 100 UNIT/ML injection Inject 15-47 Units into the skin 2 (two) times daily. Patient takes 47 units in the morning and 15 units in the evening      . lipase/protease/amylase (CREON-12/PANCREASE) 12000 UNITS CPEP Take 1 capsule by mouth 3 (three) times daily before meals.  270 capsule  1  . Multiple Vitamin (MULTIVITAMIN WITH MINERALS) TABS Take 1 tablet by mouth daily.      Marland Kitchen oxyCODONE-acetaminophen (PERCOCET/ROXICET) 5-325 MG per tablet Take 2 tablets by mouth every 4 (four) hours as needed for pain.  60 tablet  0  . pravastatin (PRAVACHOL) 10 MG tablet Take 1 tablet (10 mg total) by mouth at bedtime.  90 tablet  3  . tamsulosin (FLOMAX) 0.4 MG CAPS capsule Take 0.4 mg by mouth daily.       . hydrochlorothiazide (MICROZIDE) 12.5 MG capsule Take 1 capsule (12.5 mg total) by mouth every morning.  90 capsule  3  . metFORMIN (GLUCOPHAGE) 1000 MG tablet Take 1,000 mg by mouth 2 (two) times daily with a meal.       No current facility-administered medications on file prior to visit.   Allergies  Allergen Reactions  . Enalapril Maleate Cough   History   Social History  . Marital Status: Married    Spouse Name: N/A    Number of Children: N/A  . Years of Education: N/A   Occupational History  . Not on file.   Social History Main Topics  .  Smoking status: Never Smoker   . Smokeless tobacco: Never Used  . Alcohol Use: No  . Drug Use: No  . Sexual Activity: Not on file   Other Topics Concern  . Not on file   Social History Narrative  . No narrative on file      Review of Systems  All other systems reviewed and are negative.       Objective:   Physical Exam  Vitals reviewed. Cardiovascular: Normal rate, regular rhythm and normal heart sounds.   No murmur heard.  Pulmonary/Chest: Effort normal. No respiratory distress. He has no wheezes. He has rales.  Abdominal: Soft. Bowel sounds are normal. He exhibits no distension (Right lower lobe crackles). There is no tenderness. There is no rebound.          Assessment & Plan:  1. Acute renal failure This is likely due to obstructive nephropathy and his hydronephrosis. Symptomatically he is improved. Repeat creatinine today. If his renal function is still elevated, will need urology consult for possible stent or stone retrieval. - Basic Metabolic Panel  2. Nephrolithiasis Symptomatically the stone has passed. Await results of the BMP.  3. Hydronephrosis Patient has acute renal failure secondary to hydronephrosis in the setting of chronic kidney disease. Continue to hold hydrochlorothiazide. Continue to hold metformin. In the future metformin is not a good choice and should be replaced by another agent.  4. HTN (hypertension) Amlodipine 10 mg by mouth daily and recheck blood pressure next week.

## 2013-03-26 LAB — BASIC METABOLIC PANEL
Calcium: 8.8 mg/dL (ref 8.4–10.5)
Glucose, Bld: 329 mg/dL — ABNORMAL HIGH (ref 70–99)
Sodium: 139 mEq/L (ref 135–145)

## 2013-04-01 ENCOUNTER — Encounter: Payer: Self-pay | Admitting: Family Medicine

## 2013-04-01 ENCOUNTER — Ambulatory Visit (INDEPENDENT_AMBULATORY_CARE_PROVIDER_SITE_OTHER): Payer: Medicare Other | Admitting: Family Medicine

## 2013-04-01 VITALS — BP 86/50 | HR 68 | Temp 98.1°F | Resp 16 | Wt 210.0 lb

## 2013-04-01 DIAGNOSIS — E109 Type 1 diabetes mellitus without complications: Secondary | ICD-10-CM

## 2013-04-01 DIAGNOSIS — N289 Disorder of kidney and ureter, unspecified: Secondary | ICD-10-CM

## 2013-04-01 DIAGNOSIS — I10 Essential (primary) hypertension: Secondary | ICD-10-CM

## 2013-04-01 DIAGNOSIS — N200 Calculus of kidney: Secondary | ICD-10-CM

## 2013-04-01 DIAGNOSIS — IMO0001 Reserved for inherently not codable concepts without codable children: Secondary | ICD-10-CM

## 2013-04-01 LAB — BASIC METABOLIC PANEL WITH GFR
CO2: 26 mEq/L (ref 19–32)
Calcium: 9 mg/dL (ref 8.4–10.5)
Sodium: 143 mEq/L (ref 135–145)

## 2013-04-01 NOTE — Progress Notes (Signed)
Subjective:    Patient ID: [name], male    DOB: 1940/10/28, 72 y.o.   MRN: [id number]  HPI 03/19/13 Patient was at the beach in [place].  Developed acute onset of left-sided low back pain and hematuria. Was seen at the emergency room. There he was discovered to have multiple renal calculi at the level of the renal pelvis in the left kidney, he also had several obstructive stones in the left ureter at the level of the pelvic inlet the largest of which was 6 mm. He also had left-sided hydronephrosis and hydroureter. He has been on Flomax and Percocet to last 3 days. The pain is not improving. The pain has not moved. He did pass one 3 mm kidney stone over the last 3 days but that is all.  He is here today for followup.  He also has insulin-dependent diabetes mellitus. He is currently on Novolog 7030, he takes 47 units in the morning and 20 units in the evening. His blood sugars for the most part are good except he has had an occasional early morning hypoglycemic event down to the level of 60 and one time down to the level of 38.  He also reports one week of cough but is nonproductive. He has no fevers or chills. However on his exam he has right basilar crackles.  At that time, my plan was:1. Nephrolithiasis I am concerned the patient has an obstructive kidney stone that is borderline to be spontaneously passed at 6 mm. He has left hydronephrosis and left hydroureter. Therefore I'm going to consult urology for evaluation.  He also has a history of chronic kidney disease. Therefore I'm going to have him hold his metformin and his hydrochlorothiazide and until the hydronephrosis is resolved.  2. Cough Exam is concerning for possible community-acquired pneumonia. I will begin the patient on Zithromax 500 mg by mouth daily 1 and 250 mg by mouth daily 2 through 5 recheck next week.  3. Diabetes mellitus, insulin dependent (IDDM), controlled Decrease insulin dose at night to 15 units  subcutaneous hypoglycemic events.  03/25/13 My office called urology that day. Their Scheduler stated that they would get him appointment for the following Monday and they will call the patient. Unfortunately, that never happened and the patient has not seen urology.  Also, unfortunately he developed a fever to 101 over the weekend. He went to the emergency room as I recommended. There was found to have moderate left hydronephrosis, hydroureter, and a 2 mm obstructing kidney stone in the distal left ureter. He also had acute renal failure with her worsening of his creatinine from 1.5-2.6.  The CT scan also revealed by basilar infiltrates.  The patient left the emergency room AMA.  Patient states that he has not had any further flank pain since Saturday. He denies any hematuria. He has not had any further fever. His cough has subsided substantially. Unfortunately his blood pressure remains elevated at 160/90. This is because we had him discontinue his hydrochlorothiazide and metformin at the last office visit.  At that time, my plan was: 1. Acute renal failure This is likely due to obstructive nephropathy and his hydronephrosis. Symptomatically he is improved. Repeat creatinine today. If his renal function is still elevated, will need urology consult for possible stent or stone retrieval. - Basic Metabolic Panel  2. Nephrolithiasis Symptomatically the stone has passed. Await results of the BMP.  3. Hydronephrosis Patient has acute renal failure secondary to hydronephrosis in the setting of chronic kidney  disease. Continue to hold hydrochlorothiazide. Continue to hold metformin. In the future metformin is not a good choice and should be replaced by another agent.  4. HTN (hypertension) Amlodipine 10 mg by mouth daily and recheck blood pressure next week.  04/01/13 Patient is here today for followup. His creatinine had improved dramatically. He is pain-free and no longer has any flank pain or  hematuria. He denies any fevers or chills. His cough is better. His blood pressure however is extremely low since adding amlodipine. Unfortunately his blood sugars were also extremely high since discontinuing the metformin. His fasting blood sugars are ranging between 102 100 with an average around 140. However postprandial sugars are between 200-300 with an average of 250. He is not having any hypoglycemic events.  Past Medical History  Diagnosis Date  . Type 2 diabetes mellitus   . Essential hypertension, benign   . Prostate cancer 2011  . Mixed hyperlipidemia   . Chronic back pain     Disc disease  . Pancreatic insufficiency     Diagnosed at [facility]  . Diabetes mellitus   . Coronary atherosclerosis of native coronary artery     Coronary calcifications by chest CT, Myoview demonstrating inferolateral scar  . Diastolic dysfunction 01/7618    Grade 1.  . Hyperkalemia   . Kidney stones   . Chronic kidney disease    Past Surgical History  Procedure Laterality Date  . Prostatectomy  2011  . Hernia repair    . Appendectomy    . Cholecystectomy    . Bilateral hip replacement     Current Outpatient Prescriptions on File Prior to Visit  Medication Sig Dispense Refill  . albuterol (PROVENTIL HFA;VENTOLIN HFA) 108 (90 BASE) MCG/ACT inhaler Inhale 1-2 puffs into the lungs every 6 (six) hours as needed for wheezing.  1 Inhaler  0  . amLODipine (NORVASC) 10 MG tablet Take 1 tablet (10 mg total) by mouth daily.  90 tablet  3  . aspirin EC 81 MG tablet Take 81 mg by mouth at bedtime.       . carvedilol (COREG) 25 MG tablet Take 25 mg by mouth 2 (two) times daily.       . cetirizine (ZYRTEC) 10 MG tablet Take 10 mg by mouth daily.      . citalopram (CELEXA) 20 MG tablet Take 20 mg by mouth daily.      . diazepam (VALIUM) 5 MG tablet Take 5 mg by mouth 2 (two) times daily as needed for anxiety.      . docusate sodium (COLACE) 100 MG capsule Take 100 mg by mouth daily.      Marland Kitchen gabapentin  (NEURONTIN) 300 MG capsule Take 2 capsules (600 mg total) by mouth at bedtime.  180 capsule  3  . insulin aspart protamine-insulin aspart (NOVOLOG 70/30) (70-30) 100 UNIT/ML injection Inject 15-47 Units into the skin 2 (two) times daily. Patient takes 47 units in the morning and 15 units in the evening      . lipase/protease/amylase (CREON-12/PANCREASE) 12000 UNITS CPEP Take 1 capsule by mouth 3 (three) times daily before meals.  270 capsule  1  . Multiple Vitamin (MULTIVITAMIN WITH MINERALS) TABS Take 1 tablet by mouth daily.      . pravastatin (PRAVACHOL) 10 MG tablet Take 1 tablet (10 mg total) by mouth at bedtime.  90 tablet  3  . tamsulosin (FLOMAX) 0.4 MG CAPS capsule Take 0.4 mg by mouth daily.       . hydrochlorothiazide (  MICROZIDE) 12.5 MG capsule Take 1 capsule (12.5 mg total) by mouth every morning.  90 capsule  3  . metFORMIN (GLUCOPHAGE) 1000 MG tablet Take 1,000 mg by mouth 2 (two) times daily with a meal.      . oxyCODONE-acetaminophen (PERCOCET/ROXICET) 5-325 MG per tablet Take 2 tablets by mouth every 4 (four) hours as needed for pain.  60 tablet  0   No current facility-administered medications on file prior to visit.   Allergies  Allergen Reactions  . Enalapril Maleate Cough   History   Social History  . Marital Status: Married    Spouse Name: N/A    Number of Children: N/A  . Years of Education: N/A   Occupational History  . Not on file.   Social History Main Topics  . Smoking status: Never Smoker   . Smokeless tobacco: Never Used  . Alcohol Use: No  . Drug Use: No  . Sexual Activity: Not on file   Other Topics Concern  . Not on file   Social History Narrative  . No narrative on file      Review of Systems  All other systems reviewed and are negative.       Objective:   Physical Exam  Vitals reviewed. Cardiovascular: Normal rate, regular rhythm and normal heart sounds.   No murmur heard. Pulmonary/Chest: Effort normal. No respiratory distress.  He has no wheezes. He has no rales.  Abdominal: Soft. Bowel sounds are normal. He exhibits no distension. There is no tenderness. There is no rebound.          Assessment & Plan:   1. Acute renal insufficiency Likely due to obstructive nephropathy. Recheck BMP. Creatinine is back to baseline and patient is asymptomatic I believe his acute renal insufficiency has resolved the - BASIC METABOLIC PANEL WITH GFR  2. HTN (hypertension) Blood is too low. Discontinue amlodipine. I have asked the patient to check his blood pressure daily at home. If blood pressure rises above 140/90, the patient will have to decide whether or not to resume amlodipine 5 mg by mouth daily or retry losartan which we discontinued due to hyperkalemia.  3. Diabetes mellitus, insulin dependent (IDDM), controlled Recheck BMP today. His creatinine has returned to normal and is well below 1.5, the patient would like to try metformin again. If his creatinine is elevated , I would recommend Januvia to help lower his postprandial blood sugar.  4. Nephrolithiasis If the creatinine has returned to normal and the patient remains asymptomatic, clinically the kidney stone has cleared and I feel no further followup is necessary.

## 2013-04-02 ENCOUNTER — Telehealth: Payer: Self-pay | Admitting: Family Medicine

## 2013-04-02 NOTE — Telephone Encounter (Signed)
Pt does not want to try Januvia.  How do you want to adjust his insulin??

## 2013-04-02 NOTE — Telephone Encounter (Signed)
In crease 70/30 to 55 units in am and 20 units pam and give [place] his sugar readings next week to titrate further.

## 2013-04-02 NOTE — Telephone Encounter (Signed)
Pt called and informed about insulin dose change.  To call in one week with sugar readings or sooner if feels there is a problem

## 2013-04-02 NOTE — Telephone Encounter (Signed)
Message copied by Olena Mater on Fri Apr 02, 2013 11:22 AM ------      Message from: Jenna Luo      Created: Fri Apr 02, 2013  7:10 AM       CR is stable but not safe for metformin.  Does he want to try januvia or increase his insulin? ------

## 2013-04-20 ENCOUNTER — Encounter: Payer: Self-pay | Admitting: Family Medicine

## 2013-04-20 ENCOUNTER — Other Ambulatory Visit: Payer: Self-pay | Admitting: Family Medicine

## 2013-04-20 ENCOUNTER — Ambulatory Visit (INDEPENDENT_AMBULATORY_CARE_PROVIDER_SITE_OTHER): Payer: Medicare Other | Admitting: Family Medicine

## 2013-04-20 VITALS — BP 110/70 | HR 80 | Temp 97.9°F | Resp 16 | Ht 69.0 in | Wt 210.0 lb

## 2013-04-20 DIAGNOSIS — I10 Essential (primary) hypertension: Secondary | ICD-10-CM

## 2013-04-20 DIAGNOSIS — Z23 Encounter for immunization: Secondary | ICD-10-CM

## 2013-04-20 NOTE — Progress Notes (Signed)
Subjective:    Patient ID: [name], male    DOB: [birth year], 72 y.o.   MRN: [id number]  HPI Patient has hypertension. He is currently on Coreg 25 mg by mouth twice a day.  His blood pressure is ranging 140-150 over 80s at home.  He has discontinued hydrochlorothiazide due to chronic kidney disease.  We have discontinued losartan due to hyperkalemia.  He also has diabetes mellitus type 2. He is currently on NovoLog 70/30, 55 units in the morning and 20 units at night.  Fasting blood sugars are all 80-120. Postprandial sugars are generally 150-180 with occasional sugars of 240-288. Patient brings in 14 after supper sugars.  7 excellent and 7 are elevated. His sugars are quite variable depending upon what the patient ate for dinner. Past Medical History  Diagnosis Date  . Type 2 diabetes mellitus   . Essential hypertension, benign   . Prostate cancer 2011  . Mixed hyperlipidemia   . Chronic back pain     Disc disease  . Pancreatic insufficiency     Diagnosed at [facility]  . Diabetes mellitus   . Coronary atherosclerosis of native coronary artery     Coronary calcifications by chest CT, Myoview demonstrating inferolateral scar  . Diastolic dysfunction 01/2562    Grade 1.  . Hyperkalemia   . Kidney stones   . Chronic kidney disease    Past Surgical History  Procedure Laterality Date  . Prostatectomy  2011  . Hernia repair    . Appendectomy    . Cholecystectomy    . Bilateral hip replacement     Current Outpatient Prescriptions on File Prior to Visit  Medication Sig Dispense Refill  . albuterol (PROVENTIL HFA;VENTOLIN HFA) 108 (90 BASE) MCG/ACT inhaler Inhale 1-2 puffs into the lungs every 6 (six) hours as needed for wheezing.  1 Inhaler  0  . aspirin EC 81 MG tablet Take 81 mg by mouth at bedtime.       . carvedilol (COREG) 25 MG tablet Take 25 mg by mouth 2 (two) times daily.       . cetirizine (ZYRTEC) 10 MG tablet Take 10 mg by mouth daily.      . citalopram (CELEXA) 20 MG  tablet Take 20 mg by mouth daily.      . diazepam (VALIUM) 5 MG tablet Take 5 mg by mouth 2 (two) times daily as needed for anxiety.      . docusate sodium (COLACE) 100 MG capsule Take 100 mg by mouth daily.      Marland Kitchen gabapentin (NEURONTIN) 300 MG capsule Take 2 capsules (600 mg total) by mouth at bedtime.  180 capsule  3  . insulin aspart protamine-insulin aspart (NOVOLOG 70/30) (70-30) 100 UNIT/ML injection Inject 15-47 Units into the skin 2 (two) times daily. Patient takes 55 units in the morning and 20 units in the evening 04/02/13      . lipase/protease/amylase (CREON-12/PANCREASE) 12000 UNITS CPEP Take 1 capsule by mouth 3 (three) times daily before meals.  270 capsule  1  . Multiple Vitamin (MULTIVITAMIN WITH MINERALS) TABS Take 1 tablet by mouth daily.      Marland Kitchen oxyCODONE-acetaminophen (PERCOCET/ROXICET) 5-325 MG per tablet Take 2 tablets by mouth every 4 (four) hours as needed for pain.  60 tablet  0  . pravastatin (PRAVACHOL) 10 MG tablet Take 1 tablet (10 mg total) by mouth at bedtime.  90 tablet  3  . tamsulosin (FLOMAX) 0.4 MG CAPS capsule Take 0.4 mg by mouth daily.  No current facility-administered medications on file prior to visit.   Allergies  Allergen Reactions  . Enalapril Maleate Cough   History   Social History  . Marital Status: Married    Spouse Name: N/A    Number of Children: N/A  . Years of Education: N/A   Occupational History  . Not on file.   Social History Main Topics  . Smoking status: Never Smoker   . Smokeless tobacco: Never Used  . Alcohol Use: No  . Drug Use: No  . Sexual Activity: Not on file   Other Topics Concern  . Not on file   Social History Narrative  . No narrative on file      Review of Systems  All other systems reviewed and are negative.       Objective:   Physical Exam  Vitals reviewed. Cardiovascular: Normal rate, regular rhythm, normal heart sounds and intact distal pulses.  Exam reveals no gallop and no friction  rub.   No murmur heard. Pulmonary/Chest: Effort normal and breath sounds normal. No respiratory distress. He has no wheezes. He has no rales. He exhibits no tenderness.  Abdominal: Soft. Bowel sounds are normal. He exhibits no distension. There is no tenderness. There is no rebound and no guarding.  Musculoskeletal: He exhibits no edema.          Assessment & Plan:  1. Need for prophylactic vaccination and inoculation against influenza - Flu Vaccine QUAD 36+ mos IM  2. HTN (hypertension) Begin  Norvasc 10 mg poqday and recheck BP in 2 weeks.  3. Type II or unspecified type diabetes mellitus without mention of complication, uncontrolled Continue NovoLog 70/30 55 units in the morning 20 units at night. However the patient is going to have to begin carb counting. He is to give himself 0 units of regular insulin for a low carbohydrate meal, 5 units for a medium carbohydrate meal and 7 units for a high carbohydrate meal.  I gave the patient a handout on counting carbs and the carb content of various foods.  He is to check with me in 2 weeks and show me how his sugars are doing after we implement  carb counting plan.

## 2013-04-21 NOTE — Telephone Encounter (Signed)
Ok to refill 

## 2013-04-22 NOTE — Telephone Encounter (Signed)
Ok, but use sparingly.  Made him confused before.

## 2013-05-06 ENCOUNTER — Ambulatory Visit: Payer: Medicare Other | Admitting: Family Medicine

## 2013-05-11 ENCOUNTER — Ambulatory Visit (INDEPENDENT_AMBULATORY_CARE_PROVIDER_SITE_OTHER): Payer: Medicare Other | Admitting: Family Medicine

## 2013-05-11 ENCOUNTER — Telehealth: Payer: Self-pay | Admitting: Family Medicine

## 2013-05-11 ENCOUNTER — Encounter: Payer: Self-pay | Admitting: Family Medicine

## 2013-05-11 VITALS — BP 120/70 | HR 98 | Temp 98.1°F | Resp 20 | Ht 69.0 in | Wt 218.0 lb

## 2013-05-11 DIAGNOSIS — I10 Essential (primary) hypertension: Secondary | ICD-10-CM

## 2013-05-11 MED ORDER — LOSARTAN POTASSIUM 25 MG PO TABS: 25.0000 mg | ORAL_TABLET | Freq: Every day | ORAL | Status: DC

## 2013-05-11 MED ORDER — INSULIN GLARGINE 100 UNIT/ML ~~LOC~~ SOLN: 35.0000 [IU] | Freq: Every day | SUBCUTANEOUS | Status: DC

## 2013-05-11 NOTE — Progress Notes (Signed)
Subjective:    Patient ID: [name], male    DOB: October 20, 1940, 72 y.o.   MRN: [id number]  HPI  He presents for followup of his hypertension and chronic kidney disease. He is currently taking amlodipine 10 mg by mouth daily. We have stopped losartan in the past due to hyperkalemia. We have stopped HCTZ due to chronic kidney disease.  His blood pressure at home is running 140-145 over 80s.  He denies any chest pain or shortness of breath or dyspnea on exertion.  He also presents for followup of his diabetes mellitus. History taking 70/30 insulin 85 units in the morning and 20 units in the evening. He is also carb counting and is using regular insulin 0-7 units with meals depending upon the amount of carbohydrates per meal. His fasting blood sugars are 80-130. His two-hour postprandial sugars are 200-300 and controlled. Unfortunately he is having frequent hypoglycemia throughout the day down into the 40s which makes titrating his 70/30 insulin difficult. Past Medical History  Diagnosis Date  . Type 2 diabetes mellitus   . Essential hypertension, benign   . Prostate cancer 2011  . Mixed hyperlipidemia   . Chronic back pain     Disc disease  . Pancreatic insufficiency     Diagnosed at [facility]  . Diabetes mellitus   . Coronary atherosclerosis of native coronary artery     Coronary calcifications by chest CT, Myoview demonstrating inferolateral scar  . Diastolic dysfunction 03/2799    Grade 1.  . Hyperkalemia   . Kidney stones   . Chronic kidney disease    Current Outpatient Prescriptions on File Prior to Visit  Medication Sig Dispense Refill  . albuterol (PROVENTIL HFA;VENTOLIN HFA) 108 (90 BASE) MCG/ACT inhaler Inhale 1-2 puffs into the lungs every 6 (six) hours as needed for wheezing.  1 Inhaler  0  . aspirin EC 81 MG tablet Take 81 mg by mouth at bedtime.       . carvedilol (COREG) 25 MG tablet Take 25 mg by mouth 2 (two) times daily.       . cetirizine (ZYRTEC) 10 MG tablet Take 10  mg by mouth daily.      . citalopram (CELEXA) 20 MG tablet Take 20 mg by mouth daily.      . diazepam (VALIUM) 5 MG tablet TAKE ONE TABLET BY MOUTH TWICE DAILY AS NEEDED  60 tablet  0  . docusate sodium (COLACE) 100 MG capsule Take 100 mg by mouth daily.      Marland Kitchen gabapentin (NEURONTIN) 300 MG capsule Take 2 capsules (600 mg total) by mouth at bedtime.  180 capsule  3  . insulin aspart protamine-insulin aspart (NOVOLOG 70/30) (70-30) 100 UNIT/ML injection Inject 15-47 Units into the skin 2 (two) times daily. Patient takes 55 units in the morning and 20 units in the evening 04/02/13      . lipase/protease/amylase (CREON-12/PANCREASE) 12000 UNITS CPEP Take 1 capsule by mouth 3 (three) times daily before meals.  270 capsule  1  . Multiple Vitamin (MULTIVITAMIN WITH MINERALS) TABS Take 1 tablet by mouth daily.      Marland Kitchen oxyCODONE-acetaminophen (PERCOCET/ROXICET) 5-325 MG per tablet Take 2 tablets by mouth every 4 (four) hours as needed for pain.  60 tablet  0  . pravastatin (PRAVACHOL) 10 MG tablet Take 1 tablet (10 mg total) by mouth at bedtime.  90 tablet  3  . tamsulosin (FLOMAX) 0.4 MG CAPS capsule Take 0.4 mg by mouth daily.  No current facility-administered medications on file prior to visit.   Allergies  Allergen Reactions  . Enalapril Maleate Cough   History   Social History  . Marital Status: Married    Spouse Name: N/A    Number of Children: N/A  . Years of Education: N/A   Occupational History  . Not on file.   Social History Main Topics  . Smoking status: Never Smoker   . Smokeless tobacco: Never Used  . Alcohol Use: No  . Drug Use: No  . Sexual Activity: Not on file   Other Topics Concern  . Not on file   Social History Narrative  . No narrative on file     Review of Systems  All other systems reviewed and are negative.       Objective:   Physical Exam  Vitals reviewed. Cardiovascular: Normal rate, regular rhythm, normal heart sounds and intact distal  pulses.  Exam reveals no gallop and no friction rub.   No murmur heard. Pulmonary/Chest: Effort normal and breath sounds normal. No respiratory distress. He has no wheezes. He has no rales. He exhibits no tenderness.  Abdominal: Soft. Bowel sounds are normal.          Assessment & Plan:  1. HTN (hypertension) Add low-dose losartan 25 mg by mouth daily and recheck blood pressure in 2 weeks. - losartan (COZAAR) 25 MG tablet; Take 1 tablet (25 mg total) by mouth daily.  Dispense: 30 tablet; Refill: 2  2. Type II or unspecified type diabetes mellitus without mention of complication, uncontrolled Discontinue 70/30 insulin and begin Lantus 35 units subcutaneous twice a day. Recheck blood sugars in one week. Hopefully the Lantus will prevent many of the frequent episodes of hypoglycemia the patient is having during the daytime due to the sustained release and left reliance on rapid insulin.  I will then try to titrate Lantus further to achieve fasting blood sugars under 130. Will then titrate his regular insulin to achieve postprandial sugars less than 160. - insulin glargine (LANTUS) 100 UNIT/ML injection; Inject 0.35 mLs (35 Units total) into the skin at bedtime.  Dispense: 10 mL; Refill: 12

## 2013-05-11 NOTE — Telephone Encounter (Signed)
Patient is calling in regards to his Rx. That was written for [facility]. SIG : calls for 35 units at bedtime. He said Dr. Dennard Schaumann said for him to take it twice a day. He said that it was not filled that way.

## 2013-05-12 MED ORDER — INSULIN GLARGINE 100 UNIT/ML ~~LOC~~ SOLN: 35.0000 [IU] | Freq: Two times a day (BID) | SUBCUTANEOUS | Status: DC

## 2013-05-12 NOTE — Telephone Encounter (Signed)
Sent in corrected rx

## 2013-05-28 ENCOUNTER — Telehealth: Payer: Self-pay | Admitting: Family Medicine

## 2013-05-28 NOTE — Telephone Encounter (Signed)
Need Rx to [facility] for [employer] along with foot exam office note

## 2013-06-03 NOTE — Telephone Encounter (Signed)
Order and office notes were faxed

## 2013-06-07 ENCOUNTER — Encounter: Payer: Self-pay | Admitting: Family Medicine

## 2013-06-07 ENCOUNTER — Ambulatory Visit (INDEPENDENT_AMBULATORY_CARE_PROVIDER_SITE_OTHER): Payer: Medicare Other | Admitting: Family Medicine

## 2013-06-07 VITALS — BP 110/70 | HR 78 | Temp 96.2°F | Resp 16 | Ht 69.0 in | Wt 216.0 lb

## 2013-06-07 LAB — BASIC METABOLIC PANEL
Glucose, Bld: 164 mg/dL — ABNORMAL HIGH (ref 70–99)
Potassium: 5.8 mEq/L — ABNORMAL HIGH (ref 3.5–5.3)
Sodium: 146 mEq/L — ABNORMAL HIGH (ref 135–145)

## 2013-06-07 NOTE — Progress Notes (Signed)
Subjective:    Patient ID: [name], male    DOB: 31-Mar-1941, 72 y.o.   MRN: [id number]  HPI  05/11/13 He presents for followup of his hypertension and chronic kidney disease. He is currently taking amlodipine 10 mg by mouth daily. We have stopped losartan in the past due to hyperkalemia. We have stopped HCTZ due to chronic kidney disease.  His blood pressure at home is running 140-145 over 80s.  He denies any chest pain or shortness of breath or dyspnea on exertion.  He also presents for followup of his diabetes mellitus. History taking 70/30 insulin 85 units in the morning and 20 units in the evening. He is also carb counting and is using regular insulin 0-7 units with meals depending upon the amount of carbohydrates per meal. His fasting blood sugars are 80-130. His two-hour postprandial sugars are 200-300 and controlled. Unfortunately he is having frequent hypoglycemia throughout the day down into the 40s which makes titrating his 70/30 insulin difficult.  At that time, my plan was: 1. HTN (hypertension) Add low-dose losartan 25 mg by mouth daily and recheck blood pressure in 2 weeks. - losartan (COZAAR) 25 MG tablet; Take 1 tablet (25 mg total) by mouth daily.  Dispense: 30 tablet; Refill: 2  2. Type II or unspecified type diabetes mellitus without mention of complication, uncontrolled Discontinue 70/30 insulin and begin Lantus 35 units subcutaneous twice a day. Recheck blood sugars in one week. Hopefully the Lantus will prevent many of the frequent episodes of hypoglycemia the patient is having during the daytime due to the sustained release and left reliance on rapid insulin.  I will then try to titrate Lantus further to achieve fasting blood sugars under 130. Will then titrate his regular insulin to achieve postprandial sugars less than 160. - insulin glargine (LANTUS) 100 UNIT/ML injection; Inject 0.35 mLs (35 Units total) into the skin at bedtime.  Dispense: 10 mL; Refill:  12  06/07/13 Patient is here today for followup. His fasting blood sugars have ranged 50-150 in the mornings although the majority are less than 120. Unfortunately his 2 hour postprandial sugars are variable. Half of the sugars are good at 160-200. Half of the sugars range 200-300.  This is dependent upon the foods that he is eating.  He hasn't stopped taking regular insulin based on carb counts.   Past Medical History  Diagnosis Date  . Type 2 diabetes mellitus   . Essential hypertension, benign   . Prostate cancer 2011  . Mixed hyperlipidemia   . Chronic back pain     Disc disease  . Pancreatic insufficiency     Diagnosed at [facility]  . Diabetes mellitus   . Coronary atherosclerosis of native coronary artery     Coronary calcifications by chest CT, Myoview demonstrating inferolateral scar  . Diastolic dysfunction 02/6719    Grade 1.  . Hyperkalemia   . Kidney stones   . Chronic kidney disease    Current Outpatient Prescriptions on File Prior to Visit  Medication Sig Dispense Refill  . albuterol (PROVENTIL HFA;VENTOLIN HFA) 108 (90 BASE) MCG/ACT inhaler Inhale 1-2 puffs into the lungs every 6 (six) hours as needed for wheezing.  1 Inhaler  0  . amLODipine (NORVASC) 10 MG tablet Take 10 mg by mouth daily.      Marland Kitchen aspirin EC 81 MG tablet Take 81 mg by mouth at bedtime.       . carvedilol (COREG) 25 MG tablet Take 25 mg by mouth 2 (two)  times daily.       . cetirizine (ZYRTEC) 10 MG tablet Take 10 mg by mouth daily.      . citalopram (CELEXA) 20 MG tablet Take 20 mg by mouth daily.      . diazepam (VALIUM) 5 MG tablet TAKE ONE TABLET BY MOUTH TWICE DAILY AS NEEDED  60 tablet  0  . docusate sodium (COLACE) 100 MG capsule Take 100 mg by mouth daily.      Marland Kitchen gabapentin (NEURONTIN) 300 MG capsule Take 2 capsules (600 mg total) by mouth at bedtime.  180 capsule  3  . insulin glargine (LANTUS) 100 UNIT/ML injection Inject 0.35 mLs (35 Units total) into the skin 2 (two) times daily.  30 mL  5   . lipase/protease/amylase (CREON-12/PANCREASE) 12000 UNITS CPEP Take 1 capsule by mouth 3 (three) times daily before meals.  270 capsule  1  . losartan (COZAAR) 25 MG tablet Take 1 tablet (25 mg total) by mouth daily.  30 tablet  2  . Multiple Vitamin (MULTIVITAMIN WITH MINERALS) TABS Take 1 tablet by mouth daily.      Marland Kitchen oxyCODONE-acetaminophen (PERCOCET/ROXICET) 5-325 MG per tablet Take 2 tablets by mouth every 4 (four) hours as needed for pain.  60 tablet  0  . pravastatin (PRAVACHOL) 10 MG tablet Take 1 tablet (10 mg total) by mouth at bedtime.  90 tablet  3  . tamsulosin (FLOMAX) 0.4 MG CAPS capsule Take 0.4 mg by mouth daily.        No current facility-administered medications on file prior to visit.   Allergies  Allergen Reactions  . Enalapril Maleate Cough   History   Social History  . Marital Status: Married    Spouse Name: N/A    Number of Children: N/A  . Years of Education: N/A   Occupational History  . Not on file.   Social History Main Topics  . Smoking status: Never Smoker   . Smokeless tobacco: Never Used  . Alcohol Use: No  . Drug Use: No  . Sexual Activity: Not on file   Other Topics Concern  . Not on file   Social History Narrative  . No narrative on file     Review of Systems  All other systems reviewed and are negative.       Objective:   Physical Exam  Vitals reviewed. Cardiovascular: Normal rate, regular rhythm, normal heart sounds and intact distal pulses.  Exam reveals no gallop and no friction rub.   No murmur heard. Pulmonary/Chest: Effort normal and breath sounds normal. No respiratory distress. He has no wheezes. He has no rales. He exhibits no tenderness.  Abdominal: Soft. Bowel sounds are normal.          Assessment & Plan:   1. Type II or unspecified type diabetes mellitus without mention of complication, uncontrolled Blood pressure is now well controlled. Continue losartan. Recheck a BMP to reassess kidney function on the  losartan.  Continue Lantus 35 units twice a day. Add regular insulin with supper. He should take 0 units for a low-carb meal, 5 units for a medium carb meal, 10 units for a high carb meal.   - Basic Metabolic Panel

## 2013-06-09 ENCOUNTER — Other Ambulatory Visit: Payer: Self-pay | Admitting: Family Medicine

## 2013-06-09 DIAGNOSIS — E875 Hyperkalemia: Secondary | ICD-10-CM

## 2013-06-25 ENCOUNTER — Encounter: Payer: Self-pay | Admitting: Family Medicine

## 2013-06-25 ENCOUNTER — Ambulatory Visit (INDEPENDENT_AMBULATORY_CARE_PROVIDER_SITE_OTHER): Payer: Medicare Other | Admitting: Family Medicine

## 2013-06-25 VITALS — BP 176/92 | HR 94 | Temp 98.2°F | Resp 20 | Ht 69.0 in | Wt 212.0 lb

## 2013-06-25 DIAGNOSIS — I10 Essential (primary) hypertension: Secondary | ICD-10-CM

## 2013-06-25 DIAGNOSIS — E119 Type 2 diabetes mellitus without complications: Secondary | ICD-10-CM

## 2013-06-25 MED ORDER — DOXAZOSIN MESYLATE 8 MG PO TABS: 4.0000 mg | ORAL_TABLET | Freq: Every day | ORAL | Status: DC

## 2013-06-25 NOTE — Progress Notes (Signed)
Subjective:    Patient ID: [name], male    DOB: 05/06/1941, 72 y.o.   MRN: [id number]  HPI  05/11/13 He presents for followup of his hypertension and chronic kidney disease. He is currently taking amlodipine 10 mg by mouth daily. We have stopped losartan in the past due to hyperkalemia. We have stopped HCTZ due to chronic kidney disease.  His blood pressure at home is running 140-145 over 80s.  He denies any chest pain or shortness of breath or dyspnea on exertion.  He also presents for followup of his diabetes mellitus. History taking 70/30 insulin 85 units in the morning and 20 units in the evening. He is also carb counting and is using regular insulin 0-7 units with meals depending upon the amount of carbohydrates per meal. His fasting blood sugars are 80-130. His two-hour postprandial sugars are 200-300 and controlled. Unfortunately he is having frequent hypoglycemia throughout the day down into the 40s which makes titrating his 70/30 insulin difficult.  At that time, my plan was: 1. HTN (hypertension) Add low-dose losartan 25 mg by mouth daily and recheck blood pressure in 2 weeks. - losartan (COZAAR) 25 MG tablet; Take 1 tablet (25 mg total) by mouth daily.  Dispense: 30 tablet; Refill: 2  2. Type II or unspecified type diabetes mellitus without mention of complication, uncontrolled Discontinue 70/30 insulin and begin Lantus 35 units subcutaneous twice a day. Recheck blood sugars in one week. Hopefully the Lantus will prevent many of the frequent episodes of hypoglycemia the patient is having during the daytime due to the sustained release and left reliance on rapid insulin.  I will then try to titrate Lantus further to achieve fasting blood sugars under 130. Will then titrate his regular insulin to achieve postprandial sugars less than 160. - insulin glargine (LANTUS) 100 UNIT/ML injection; Inject 0.35 mLs (35 Units total) into the skin at bedtime.  Dispense: 10 mL; Refill:  12  06/07/13 Patient is here today for followup. His fasting blood sugars have ranged 50-150 in the mornings although the majority are less than 120. Unfortunately his 2 hour postprandial sugars are variable. Half of the sugars are good at 160-200. Half of the sugars range 200-300.  This is dependent upon the foods that he is eating.  He hasn't stopped taking regular insulin based on carb counts.  At that time, my plan was:  1. Type II or unspecified type diabetes mellitus without mention of complication, uncontrolled Blood pressure is now well controlled. Continue losartan. Recheck a BMP to reassess kidney function on the losartan.  Continue Lantus 35 units twice a day. Add regular insulin with supper. He should take 0 units for a low-carb meal, 5 units for a medium carb meal, 10 units for a high carb meal.   - Basic Metabolic Panel   08/13/30 Unfortunately the patient's potassium returned elevated at 5.8. He is decreased losartan to one half of a 25 mg tablet every day. He had to discontinue amlodipine due to significant peripheral edema in his feet and his legs. As a result his blood pressures have been ranging 141-153/62-83. The blood pressure today in the office is higher than any blood pressure he is having home. I am concerned that the potassium will still be high on losartan will likely need to discontinue this therefore his blood pressure obviously would be too high for diabetic patient with a history of coronary artery disease. He also comes in today bringing his blood sugars with him. His fasting  blood sugars in the morning range 38-114 his two-hour postprandial sugars range 109-259 but the vast majority are less than 200. Therefore his evening sugars are now relatively well controlled. He is experiencing hypoglycemia in the mornings. He is currently on Lantus 35 units subcutaneous twice a day.   Past Medical History  Diagnosis Date  . Type 2 diabetes mellitus   . Essential hypertension,  benign   . Prostate cancer 2011  . Mixed hyperlipidemia   . Chronic back pain     Disc disease  . Pancreatic insufficiency     Diagnosed at [facility]  . Diabetes mellitus   . Coronary atherosclerosis of native coronary artery     Coronary calcifications by chest CT, Myoview demonstrating inferolateral scar  . Diastolic dysfunction 0/1655    Grade 1.  . Hyperkalemia   . Kidney stones   . Chronic kidney disease    Current Outpatient Prescriptions on File Prior to Visit  Medication Sig Dispense Refill  . albuterol (PROVENTIL HFA;VENTOLIN HFA) 108 (90 BASE) MCG/ACT inhaler Inhale 1-2 puffs into the lungs every 6 (six) hours as needed for wheezing.  1 Inhaler  0  . aspirin EC 81 MG tablet Take 81 mg by mouth at bedtime.       . carvedilol (COREG) 25 MG tablet Take 25 mg by mouth 2 (two) times daily.       . cetirizine (ZYRTEC) 10 MG tablet Take 10 mg by mouth daily.      . citalopram (CELEXA) 20 MG tablet Take 20 mg by mouth daily.      . diazepam (VALIUM) 5 MG tablet TAKE ONE TABLET BY MOUTH TWICE DAILY AS NEEDED  60 tablet  0  . docusate sodium (COLACE) 100 MG capsule Take 100 mg by mouth daily.      Marland Kitchen gabapentin (NEURONTIN) 300 MG capsule Take 2 capsules (600 mg total) by mouth at bedtime.  180 capsule  3  . insulin glargine (LANTUS) 100 UNIT/ML injection Inject 0.35 mLs (35 Units total) into the skin 2 (two) times daily.  30 mL  5  . lipase/protease/amylase (CREON-12/PANCREASE) 12000 UNITS CPEP Take 1 capsule by mouth 3 (three) times daily before meals.  270 capsule  1  . losartan (COZAAR) 25 MG tablet Take 1 tablet (25 mg total) by mouth daily.  30 tablet  2  . Multiple Vitamin (MULTIVITAMIN WITH MINERALS) TABS Take 1 tablet by mouth daily.      Marland Kitchen oxyCODONE-acetaminophen (PERCOCET/ROXICET) 5-325 MG per tablet Take 2 tablets by mouth every 4 (four) hours as needed for pain.  60 tablet  0  . pravastatin (PRAVACHOL) 10 MG tablet Take 1 tablet (10 mg total) by mouth at bedtime.  90 tablet  3    No current facility-administered medications on file prior to visit.   Allergies  Allergen Reactions  . Enalapril Maleate Cough   History   Social History  . Marital Status: Married    Spouse Name: N/A    Number of Children: N/A  . Years of Education: N/A   Occupational History  . Not on file.   Social History Main Topics  . Smoking status: Never Smoker   . Smokeless tobacco: Never Used  . Alcohol Use: No  . Drug Use: No  . Sexual Activity: Not on file   Other Topics Concern  . Not on file   Social History Narrative  . No narrative on file     Review of Systems  All other systems  reviewed and are negative.       Objective:   Physical Exam  Vitals reviewed. Cardiovascular: Normal rate, regular rhythm, normal heart sounds and intact distal pulses.  Exam reveals no gallop and no friction rub.   No murmur heard. Pulmonary/Chest: Effort normal and breath sounds normal. No respiratory distress. He has no wheezes. He has no rales. He exhibits no tenderness.  Abdominal: Soft. Bowel sounds are normal.          Assessment & Plan:  1. HTN (hypertension) Add Cardura 4 mg by mouth daily. Recheck blood pressure in 2 weeks. I will check a BMP. His potassium is still elevated we'll need to discontinue losartan altogether. - BASIC METABOLIC PANEL WITH GFR  2. Type II or unspecified type diabetes mellitus without mention of complication, not stated as uncontrolled I am very happy with the patient's evening sugars. Magic concerned about hypoglycemia in the morning. There asked patient to continue Lantus 35 units subcutaneous in the morning and decrease Lantus to 25 units subcutaneous in the evenings. Recheck sugars in 2 weeks.

## 2013-06-26 LAB — BASIC METABOLIC PANEL WITH GFR
CO2: 29 mEq/L (ref 19–32)
Chloride: 108 mEq/L (ref 96–112)
Glucose, Bld: 233 mg/dL — ABNORMAL HIGH (ref 70–99)
Potassium: 4.7 mEq/L (ref 3.5–5.3)
Sodium: 142 mEq/L (ref 135–145)

## 2013-07-12 ENCOUNTER — Other Ambulatory Visit: Payer: Self-pay | Admitting: Family Medicine

## 2013-07-12 NOTE — Telephone Encounter (Signed)
ok 

## 2013-07-12 NOTE — Telephone Encounter (Signed)
?   OK to Refill  

## 2013-07-13 ENCOUNTER — Telehealth: Payer: Self-pay | Admitting: Family Medicine

## 2013-07-13 MED ORDER — DOXAZOSIN MESYLATE 8 MG PO TABS: 8.0000 mg | ORAL_TABLET | Freq: Every day | ORAL | Status: DC

## 2013-07-13 NOTE — Telephone Encounter (Signed)
Fasting BS - 97,282,060,15,61,537,943,276,14,709,29,574,73,403 Bedtime BS - 188,210,170,244,217,181,186,179,214,141,209,234,206 BP in AM - 144/83,141/88,125/70,131/76,135/76,140/75,142/81,142/79,135/75,142/84,138/74,141/81 BP in PM - 164/88,159/87,161/84,148/79,161/88,155/83, 163/85 ,165/85,138/72,151/83,161/89   Per Dr. Dennard Schaumann fasting bs look great - Would like to increase regular insulin to 10-15 units with supper to get the pm bs's down some. Also increase Doxazosin to 36m qd  .[facility]

## 2013-08-03 ENCOUNTER — Telehealth: Payer: Self-pay | Admitting: Family Medicine

## 2013-08-03 MED ORDER — CARVEDILOL 25 MG PO TABS: 25.0000 mg | ORAL_TABLET | Freq: Two times a day (BID) | ORAL | Status: DC

## 2013-08-03 NOTE — Telephone Encounter (Signed)
Medication refilled per protocol. 

## 2013-08-15 ENCOUNTER — Other Ambulatory Visit: Payer: Self-pay | Admitting: Family Medicine

## 2013-08-30 ENCOUNTER — Encounter: Payer: Self-pay | Admitting: Family Medicine

## 2013-08-30 ENCOUNTER — Ambulatory Visit (INDEPENDENT_AMBULATORY_CARE_PROVIDER_SITE_OTHER): Payer: Medicare HMO | Admitting: Family Medicine

## 2013-08-30 VITALS — BP 146/82 | HR 80 | Temp 98.1°F | Resp 18 | Ht 69.0 in | Wt 217.0 lb

## 2013-08-30 DIAGNOSIS — I10 Essential (primary) hypertension: Secondary | ICD-10-CM

## 2013-08-30 DIAGNOSIS — R279 Unspecified lack of coordination: Secondary | ICD-10-CM

## 2013-08-30 DIAGNOSIS — E119 Type 2 diabetes mellitus without complications: Secondary | ICD-10-CM

## 2013-08-30 DIAGNOSIS — R5381 Other malaise: Secondary | ICD-10-CM

## 2013-08-30 DIAGNOSIS — R5383 Other fatigue: Principal | ICD-10-CM

## 2013-08-30 DIAGNOSIS — R270 Ataxia, unspecified: Secondary | ICD-10-CM

## 2013-08-30 LAB — COMPLETE METABOLIC PANEL WITH GFR
ALBUMIN: 3.9 g/dL (ref 3.5–5.2)
ALK PHOS: 137 U/L — AB (ref 39–117)
ALT: 42 U/L (ref 0–53)
AST: 34 U/L (ref 0–37)
BILIRUBIN TOTAL: 0.5 mg/dL (ref 0.3–1.2)
BUN: 32 mg/dL — AB (ref 6–23)
CO2: 19 mEq/L (ref 19–32)
CREATININE: 1.57 mg/dL — AB (ref 0.50–1.35)
Calcium: 8.8 mg/dL (ref 8.4–10.5)
Chloride: 108 mEq/L (ref 96–112)
GFR, EST NON AFRICAN AMERICAN: 43 mL/min — AB
GFR, Est African American: 50 mL/min — ABNORMAL LOW
GLUCOSE: 99 mg/dL (ref 70–99)
Potassium: 4.4 mEq/L (ref 3.5–5.3)
Sodium: 139 mEq/L (ref 135–145)
Total Protein: 6.7 g/dL (ref 6.0–8.3)

## 2013-08-30 LAB — CBC WITH DIFFERENTIAL/PLATELET
BASOS ABS: 0 10*3/uL (ref 0.0–0.1)
BASOS PCT: 0 % (ref 0–1)
Eosinophils Absolute: 0.3 10*3/uL (ref 0.0–0.7)
Eosinophils Relative: 3 % (ref 0–5)
HEMATOCRIT: 43.5 % (ref 39.0–52.0)
Hemoglobin: 14.1 g/dL (ref 13.0–17.0)
Lymphocytes Relative: 19 % (ref 12–46)
Lymphs Abs: 1.5 10*3/uL (ref 0.7–4.0)
MCH: 29.8 pg (ref 26.0–34.0)
MCHC: 32.4 g/dL (ref 30.0–36.0)
MCV: 92 fL (ref 78.0–100.0)
MONO ABS: 0.5 10*3/uL (ref 0.1–1.0)
Monocytes Relative: 7 % (ref 3–12)
NEUTROS ABS: 5.8 10*3/uL (ref 1.7–7.7)
Neutrophils Relative %: 71 % (ref 43–77)
PLATELETS: 180 10*3/uL (ref 150–400)
RBC: 4.73 MIL/uL (ref 4.22–5.81)
RDW: 13.6 % (ref 11.5–15.5)
WBC: 8.1 10*3/uL (ref 4.0–10.5)

## 2013-08-30 LAB — HEMOGLOBIN A1C
HEMOGLOBIN A1C: 8 % — AB (ref <5.7)
Mean Plasma Glucose: 183 mg/dL — ABNORMAL HIGH (ref <117)

## 2013-08-30 LAB — VITAMIN B12: VITAMIN B 12: 901 pg/mL (ref 211–911)

## 2013-08-30 LAB — TSH: TSH: 3.502 u[IU]/mL (ref 0.350–4.500)

## 2013-08-30 LAB — TESTOSTERONE: TESTOSTERONE: 259 ng/dL — AB (ref 300–890)

## 2013-08-30 NOTE — Addendum Note (Signed)
Addended by: WRAY, [place] on: 08/30/2013 09:35 AM   Modules accepted: Orders

## 2013-08-30 NOTE — Progress Notes (Signed)
Subjective:    Patient ID: [name], male    DOB: [birth year], 73 y.o.   MRN: [id number]  HPI Is here today for followup of his diabetes. His blood sugars tend to be ranging 70 to 1:30 in the morning and less than 200 in the evening. He has the occasional hyperglycemic episode the most part his sugars are excellent. His blood pressure is very well controlled and warnings. It has been running higher in the evenings from 140-160/80-90. However the patient reports episodes of ataxia over the last 2 weeks. The patient states that while he is walking he'll begin staggering off to the side for no reason. He has also been slurring his speech recently. He denies any other use lateral neurologic deficits. He denies any memory loss. He denies any headaches. He does complain of severe profound weakness. His wife has to help him stand from a seated position.  He denies any chest pain, shortness of breath, dyspnea on exertion. Past Medical History  Diagnosis Date  . Type 2 diabetes mellitus   . Essential hypertension, benign   . Prostate cancer 2011  . Mixed hyperlipidemia   . Chronic back pain     Disc disease  . Pancreatic insufficiency     Diagnosed at [facility]  . Diabetes mellitus   . Coronary atherosclerosis of native coronary artery     Coronary calcifications by chest CT, Myoview demonstrating inferolateral scar  . Diastolic dysfunction 02/8241    Grade 1.  . Hyperkalemia   . Kidney stones   . Chronic kidney disease    Past Surgical History  Procedure Laterality Date  . Prostatectomy  2011  . Hernia repair    . Appendectomy    . Cholecystectomy    . Bilateral hip replacement     Current Outpatient Prescriptions on File Prior to Visit  Medication Sig Dispense Refill  . albuterol (PROVENTIL HFA;VENTOLIN HFA) 108 (90 BASE) MCG/ACT inhaler Inhale 1-2 puffs into the lungs every 6 (six) hours as needed for wheezing.  1 Inhaler  0  . aspirin EC 81 MG tablet Take 81 mg by mouth at bedtime.        . carvedilol (COREG) 25 MG tablet Take 1 tablet (25 mg total) by mouth 2 (two) times daily.  180 tablet  1  . cetirizine (ZYRTEC) 10 MG tablet Take 10 mg by mouth daily.      . citalopram (CELEXA) 20 MG tablet Take 20 mg by mouth daily.      . diazepam (VALIUM) 5 MG tablet TAKE ONE TABLET BY MOUTH TWICE DAILY AS NEEDED --  **USE  SPARINGLY**  60 tablet  2  . docusate sodium (COLACE) 100 MG capsule Take 100 mg by mouth daily.      Marland Kitchen doxazosin (CARDURA) 8 MG tablet Take 1 tablet (8 mg total) by mouth daily.  30 tablet  3  . gabapentin (NEURONTIN) 300 MG capsule Take 2 capsules (600 mg total) by mouth at bedtime.  180 capsule  3  . insulin glargine (LANTUS) 100 UNIT/ML injection Inject 0.35 mLs (35 Units total) into the skin 2 (two) times daily.  30 mL  5  . lipase/protease/amylase (CREON-12/PANCREASE) 12000 UNITS CPEP Take 1 capsule by mouth 3 (three) times daily before meals.  270 capsule  1  . Multiple Vitamin (MULTIVITAMIN WITH MINERALS) TABS Take 1 tablet by mouth daily.      Marland Kitchen oxyCODONE-acetaminophen (PERCOCET/ROXICET) 5-325 MG per tablet Take 2 tablets by mouth every 4 (four)  hours as needed for pain.  60 tablet  0  . pravastatin (PRAVACHOL) 10 MG tablet Take 1 tablet (10 mg total) by mouth at bedtime.  90 tablet  3   No current facility-administered medications on file prior to visit.   Allergies  Allergen Reactions  . Enalapril Maleate Cough   History   Social History  . Marital Status: Married    Spouse Name: N/A    Number of Children: N/A  . Years of Education: N/A   Occupational History  . Not on file.   Social History Main Topics  . Smoking status: Never Smoker   . Smokeless tobacco: Never Used  . Alcohol Use: No  . Drug Use: No  . Sexual Activity: Not on file   Other Topics Concern  . Not on file   Social History Narrative  . No narrative on file      Review of Systems  All other systems reviewed and are negative.       Objective:   Physical Exam    Vitals reviewed. Constitutional: He is oriented to person, place, and time.  Eyes: Conjunctivae and EOM are normal. Pupils are equal, round, and reactive to light. Right eye exhibits no discharge. Left eye exhibits no discharge. No scleral icterus.  Neck: Neck supple. No JVD present. No thyromegaly present.  Cardiovascular: Normal rate, regular rhythm and normal heart sounds.   No murmur heard. Pulmonary/Chest: Effort normal and breath sounds normal. No respiratory distress. He has no wheezes. He has no rales.  Abdominal: Soft. Bowel sounds are normal. He exhibits no distension. There is no tenderness. There is no rebound and no guarding.  Musculoskeletal: Normal range of motion. He exhibits no edema.  Lymphadenopathy:    He has no cervical adenopathy.  Neurological: He is alert and oriented to person, place, and time. He has normal reflexes. He displays normal reflexes. No cranial nerve deficit. He exhibits normal muscle tone. Coordination normal.   patient has a slow shuffling gait. He is afraid to lift his feet high from the ground due to his imbalance. Otherwise there are no evident  neurological deficits. Romberg examination is normal. Other cerebellar exams are normal. Assessment & Plan:  1. Other malaise and fatigue The gamma checking a CBC, TSH, B12, and testosterone level. - CBC w/MCH & 3 Part Diff - Hemoglobin A1c - COMPLETE METABOLIC PANEL WITH GFR - TSH - Vitamin B12 - Testosterone - Vit D  25 hydroxy (rtn osteoporosis monitoring) - MR Brain W Wo Contrast; Future  2. Ataxia Concerned about a small stroke in the posterior circulation. I will schedule an MRI of the brain. Normal pressure hydrocephalus would also be another concern although the patient has no memory loss. Another possible concern would be peripheral neuropathy causing his ataxia. - MR Brain W Wo Contrast; Future  3. Type II or unspecified type diabetes mellitus without mention of complication, not stated as  uncontrolled I am very satisfied with his blood sugars. I will check hemoglobin A1c. - MR Brain W Wo Contrast; Future  4. HTN (hypertension) Impression the mornings as typically controlled. His blood pressure in the evening is elevated. I will not increase medication to see the results of his MRI. It is possible that doxazosincould be causing his dizziness. - MR Brain W Wo Contrast; Future

## 2013-08-31 ENCOUNTER — Other Ambulatory Visit: Payer: Self-pay | Admitting: Family Medicine

## 2013-08-31 LAB — VITAMIN D 25 HYDROXY (VIT D DEFICIENCY, FRACTURES): VIT D 25 HYDROXY: 16 ng/mL — AB (ref 30–89)

## 2013-08-31 NOTE — Telephone Encounter (Signed)
Medication refilled per protocol. 

## 2013-09-01 ENCOUNTER — Other Ambulatory Visit: Payer: Self-pay | Admitting: Family Medicine

## 2013-09-01 MED ORDER — VITAMIN D (ERGOCALCIFEROL) 1.25 MG (50000 UNIT) PO CAPS: 50000.0000 [IU] | ORAL_CAPSULE | ORAL | Status: DC

## 2013-09-03 ENCOUNTER — Other Ambulatory Visit: Payer: Self-pay | Admitting: Family Medicine

## 2013-09-03 ENCOUNTER — Telehealth: Payer: Self-pay | Admitting: Family Medicine

## 2013-09-03 MED ORDER — OSELTAMIVIR PHOSPHATE 75 MG PO CAPS: 75.0000 mg | ORAL_CAPSULE | Freq: Two times a day (BID) | ORAL | Status: DC

## 2013-09-03 NOTE — Telephone Encounter (Signed)
Sounds like flu, I will call out tamiflu.

## 2013-09-03 NOTE — Telephone Encounter (Signed)
What symptoms is he having? Does it sound like the flu?  Would he benefit from tamiflu?  I need more clinical information.

## 2013-09-03 NOTE — Telephone Encounter (Signed)
Since being seen Monday has developed bad chest cold.  Very congested.  No fever.  Very concerned due to history of pneumonia.  Please advise.  Feels like "he has been hit by a truck"  Please advise?

## 2013-09-03 NOTE — Telephone Encounter (Signed)
Hurts all over. No chills.  No fever.  Just feels very congested, mostly in head.  Runny nose.  A lot of coughing.  Is able to take deep breath.  Chest is hurting when coughs.  Is taking Tylenol cold and Alka seltzer cold OTC.  Not doing very much

## 2013-09-03 NOTE — Telephone Encounter (Signed)
Pt called and made aware of RX

## 2013-09-05 ENCOUNTER — Emergency Department (HOSPITAL_COMMUNITY)
Admission: EM | Admit: 2013-09-05 | Discharge: 2013-09-05 | Disposition: A | Discharge status: Home / Self Care | Payer: Medicare PPO | Attending: Emergency Medicine | Admitting: Emergency Medicine

## 2013-09-05 ENCOUNTER — Emergency Department (HOSPITAL_COMMUNITY): Payer: Medicare PPO

## 2013-09-05 ENCOUNTER — Encounter (HOSPITAL_COMMUNITY): Payer: Self-pay | Admitting: Emergency Medicine

## 2013-09-05 DIAGNOSIS — Z794 Long term (current) use of insulin: Secondary | ICD-10-CM | POA: Insufficient documentation

## 2013-09-05 DIAGNOSIS — G8929 Other chronic pain: Secondary | ICD-10-CM | POA: Insufficient documentation

## 2013-09-05 DIAGNOSIS — Z79899 Other long term (current) drug therapy: Secondary | ICD-10-CM | POA: Insufficient documentation

## 2013-09-05 DIAGNOSIS — R109 Unspecified abdominal pain: Secondary | ICD-10-CM | POA: Insufficient documentation

## 2013-09-05 DIAGNOSIS — Z8546 Personal history of malignant neoplasm of prostate: Secondary | ICD-10-CM | POA: Insufficient documentation

## 2013-09-05 DIAGNOSIS — Z87442 Personal history of urinary calculi: Secondary | ICD-10-CM | POA: Insufficient documentation

## 2013-09-05 DIAGNOSIS — Z7982 Long term (current) use of aspirin: Secondary | ICD-10-CM | POA: Insufficient documentation

## 2013-09-05 DIAGNOSIS — Z8719 Personal history of other diseases of the digestive system: Secondary | ICD-10-CM | POA: Insufficient documentation

## 2013-09-05 DIAGNOSIS — E119 Type 2 diabetes mellitus without complications: Secondary | ICD-10-CM | POA: Insufficient documentation

## 2013-09-05 DIAGNOSIS — I2510 Atherosclerotic heart disease of native coronary artery without angina pectoris: Secondary | ICD-10-CM | POA: Insufficient documentation

## 2013-09-05 DIAGNOSIS — N189 Chronic kidney disease, unspecified: Secondary | ICD-10-CM | POA: Insufficient documentation

## 2013-09-05 DIAGNOSIS — I129 Hypertensive chronic kidney disease with stage 1 through stage 4 chronic kidney disease, or unspecified chronic kidney disease: Secondary | ICD-10-CM | POA: Insufficient documentation

## 2013-09-05 DIAGNOSIS — E782 Mixed hyperlipidemia: Secondary | ICD-10-CM | POA: Insufficient documentation

## 2013-09-05 DIAGNOSIS — Z8701 Personal history of pneumonia (recurrent): Secondary | ICD-10-CM | POA: Insufficient documentation

## 2013-09-05 DIAGNOSIS — J069 Acute upper respiratory infection, unspecified: Secondary | ICD-10-CM | POA: Insufficient documentation

## 2013-09-05 DIAGNOSIS — Z9089 Acquired absence of other organs: Secondary | ICD-10-CM | POA: Insufficient documentation

## 2013-09-05 LAB — COMPREHENSIVE METABOLIC PANEL
ALBUMIN: 2.9 g/dL — AB (ref 3.5–5.2)
ALK PHOS: 143 U/L — AB (ref 39–117)
ALT: 25 U/L (ref 0–53)
AST: 22 U/L (ref 0–37)
BUN: 23 mg/dL (ref 6–23)
CO2: 27 mEq/L (ref 19–32)
Calcium: 8.6 mg/dL (ref 8.4–10.5)
Chloride: 104 mEq/L (ref 96–112)
Creatinine, Ser: 1.24 mg/dL (ref 0.50–1.35)
GFR calc Af Amer: 65 mL/min — ABNORMAL LOW (ref >90)
GFR calc non Af Amer: 56 mL/min — ABNORMAL LOW (ref >90)
Glucose, Bld: 182 mg/dL — ABNORMAL HIGH (ref 70–99)
POTASSIUM: 4.3 meq/L (ref 3.7–5.3)
SODIUM: 141 meq/L (ref 137–147)
TOTAL PROTEIN: 7 g/dL (ref 6.0–8.3)
Total Bilirubin: 0.3 mg/dL (ref 0.3–1.2)

## 2013-09-05 LAB — CBC WITH DIFFERENTIAL/PLATELET
BASOS PCT: 0 % (ref 0–1)
Basophils Absolute: 0 10*3/uL (ref 0.0–0.1)
EOS ABS: 0.3 10*3/uL (ref 0.0–0.7)
Eosinophils Relative: 5 % (ref 0–5)
HCT: 42.6 % (ref 39.0–52.0)
Hemoglobin: 13.4 g/dL (ref 13.0–17.0)
LYMPHS ABS: 1.4 10*3/uL (ref 0.7–4.0)
Lymphocytes Relative: 28 % (ref 12–46)
MCH: 29.9 pg (ref 26.0–34.0)
MCHC: 31.5 g/dL (ref 30.0–36.0)
MCV: 95.1 fL (ref 78.0–100.0)
Monocytes Absolute: 0.5 10*3/uL (ref 0.1–1.0)
Monocytes Relative: 9 % (ref 3–12)
NEUTROS PCT: 57 % (ref 43–77)
Neutro Abs: 2.8 10*3/uL (ref 1.7–7.7)
PLATELETS: 172 10*3/uL (ref 150–400)
RBC: 4.48 MIL/uL (ref 4.22–5.81)
RDW: 13 % (ref 11.5–15.5)
WBC: 5 10*3/uL (ref 4.0–10.5)

## 2013-09-05 LAB — LIPASE, BLOOD: Lipase: 6 U/L — ABNORMAL LOW (ref 11–59)

## 2013-09-05 LAB — LACTIC ACID, PLASMA: Lactic Acid, Venous: 0.9 mmol/L (ref 0.5–2.2)

## 2013-09-05 MED ORDER — HYDROCOD POLST-CHLORPHEN POLST 10-8 MG/5ML PO LQCR: 5.0000 mL | Freq: Two times a day (BID) | ORAL | Status: DC | PRN

## 2013-09-05 MED ORDER — SODIUM CHLORIDE 0.9 % IV BOLUS (SEPSIS)
1000.0000 mL | Freq: Once | INTRAVENOUS | Status: AC
  Administered 2013-09-05: 1000 mL via INTRAVENOUS

## 2013-09-05 NOTE — Discharge Instructions (Signed)
As discussed, your evaluation today has been largely reassuring, with no current evidence of pneumonia, systemic infection.  However, it is important that you continue to follow up with your primary care physician as discussed.  Please be sure to call tomorrow, and discussed today's presentation.  In addition, please do not hesitate to return here if you develop new, or concerning changes in your condition.   Cough, Adult  A cough is a reflex that helps clear your throat and airways. It can help heal the body or may be a reaction to an irritated airway. A cough may only last 2 or 3 weeks (acute) or may last more than 8 weeks (chronic).  CAUSES Acute cough:  Viral or bacterial infections. Chronic cough:  Infections.  Allergies.  Asthma.  Post-nasal drip.  Smoking.  Heartburn or acid reflux.  Some medicines.  Chronic lung problems (COPD).  Cancer. SYMPTOMS   Cough.  Fever.  Chest pain.  Increased breathing rate.  High-pitched whistling sound when breathing (wheezing).  Colored mucus that you cough up (sputum). TREATMENT   A bacterial cough may be treated with antibiotic medicine.  A viral cough must run its course and will not respond to antibiotics.  Your caregiver may recommend other treatments if you have a chronic cough. HOME CARE INSTRUCTIONS   Only take over-the-counter or prescription medicines for pain, discomfort, or fever as directed by your caregiver. Use cough suppressants only as directed by your caregiver.  Use a cold steam vaporizer or humidifier in your bedroom or home to help loosen secretions.  Sleep in a semi-upright position if your cough is worse at night.  Rest as needed.  Stop smoking if you smoke. SEEK IMMEDIATE MEDICAL CARE IF:   You have pus in your sputum.  Your cough starts to worsen.  You cannot control your cough with suppressants and are losing sleep.  You begin coughing up blood.  You have difficulty  breathing.  You develop pain which is getting worse or is uncontrolled with medicine.  You have a fever. MAKE SURE YOU:   Understand these instructions.  Will watch your condition.  Will get help right away if you are not doing well or get worse. Document Released: 01/18/2011 Document Revised: 10/14/2011 Document Reviewed: 01/18/2011 [facility] Patient Information 2014 [place].

## 2013-09-05 NOTE — ED Notes (Signed)
Cough and wheezing with chest and nasal congestion x 6 days.  C/o headache and right flank pain with coughing.

## 2013-09-05 NOTE — ED Provider Notes (Signed)
CSN: [id number]     Arrival date & time 09/05/13  1151 History   This chart was scribed for Carmin Muskrat, MD, by Neta Ehlers, ED Scribe. This patient was seen in room APA08/APA08 and the patient's care was started at 12:38 PM.  First MD Initiated Contact with Patient 09/05/13 1213     Chief Complaint  Patient presents with  . Wheezing   The history is provided by the patient. No language interpreter was used.   HPI Comments: [name] is a 73 y.o. male, with a h/o DM and coronary atherosclerosis, who presents to the Emergency Department complaining of cough productive of green phlegm which is associated with right-sided, post-tussive flank pain. He states that he has had cold symptoms for approximately a week, and he reports he has a h/o pneumonia. He was hospitalized last year for pneumonia. A week ago, the pt also experienced speech difficulty, gait difficulties, and fatigue; his PCP ordered an MRI which is scheduled for tomorrow. [name] denies confusion, visual loss, LOC, fever, abdominal pain, nausea, or emesis. He reports his last day of feeling normal was a couple weeks ago. The pt denies a h/o stroke or blood clots; he had a subdural hematoma in 2004 as a consequence of a fall. [name] is a non-smoker.    Past Medical History  Diagnosis Date  . Type 2 diabetes mellitus   . Essential hypertension, benign   . Prostate cancer 2011  . Mixed hyperlipidemia   . Chronic back pain     Disc disease  . Pancreatic insufficiency     Diagnosed at [facility]  . Diabetes mellitus   . Coronary atherosclerosis of native coronary artery     Coronary calcifications by chest CT, Myoview demonstrating inferolateral scar  . Diastolic dysfunction 12/4625    Grade 1.  . Hyperkalemia   . Kidney stones   . Chronic kidney disease    Past Surgical History  Procedure Laterality Date  . Prostatectomy  2011  . Hernia repair    . Appendectomy    . Cholecystectomy    . Bilateral hip  replacement     Family History  Problem Relation Age of Onset  . Heart attack Father   . Diabetes type II Mother    History  Substance Use Topics  . Smoking status: Never Smoker   . Smokeless tobacco: Never Used  . Alcohol Use: No    Review of Systems  Constitutional:       Per HPI, otherwise negative  HENT:       Per HPI, otherwise negative  Respiratory:       Per HPI, otherwise negative  Cardiovascular:       Per HPI, otherwise negative  Gastrointestinal: Negative for vomiting.  Endocrine:       Negative aside from HPI  Genitourinary:       Neg aside from HPI   Musculoskeletal:       Per HPI, otherwise negative  Skin: Negative.   Neurological: Negative for syncope.    Allergies  Enalapril maleate  Home Medications   Current Outpatient Rx  Name  Route  Sig  Dispense  Refill  . albuterol (PROVENTIL HFA;VENTOLIN HFA) 108 (90 BASE) MCG/ACT inhaler   Inhalation   Inhale 1-2 puffs into the lungs every 6 (six) hours as needed for wheezing.   1 Inhaler   0   . aspirin EC 81 MG tablet   Oral   Take 81 mg by mouth at  bedtime.          . carvedilol (COREG) 25 MG tablet   Oral   Take 1 tablet (25 mg total) by mouth 2 (two) times daily.   180 tablet   1   . cetirizine (ZYRTEC) 10 MG tablet   Oral   Take 10 mg by mouth daily.         . citalopram (CELEXA) 20 MG tablet      TAKE ONE TABLET BY MOUTH EVERY DAY   90 tablet   1   . diazepam (VALIUM) 5 MG tablet      TAKE ONE TABLET BY MOUTH TWICE DAILY AS NEEDED --  **USE  SPARINGLY**   60 tablet   2   . docusate sodium (COLACE) 100 MG capsule   Oral   Take 100 mg by mouth daily.         Marland Kitchen doxazosin (CARDURA) 8 MG tablet   Oral   Take 1 tablet (8 mg total) by mouth daily.   30 tablet   3   . gabapentin (NEURONTIN) 300 MG capsule   Oral   Take 2 capsules (600 mg total) by mouth at bedtime.   180 capsule   3   . insulin glargine (LANTUS) 100 UNIT/ML injection   Subcutaneous   Inject 0.35  mLs (35 Units total) into the skin 2 (two) times daily.   30 mL   5     Change in dosage - DX: 250.00   . lipase/protease/amylase (CREON-12/PANCREASE) 12000 UNITS CPEP   Oral   Take 1 capsule by mouth 3 (three) times daily before meals.   270 capsule   1   . losartan (COZAAR) 25 MG tablet      TAKE 1/2 TABLET BY MOUTH ONCE DAILY         . Multiple Vitamin (MULTIVITAMIN WITH MINERALS) TABS   Oral   Take 1 tablet by mouth daily.         Marland Kitchen oseltamivir (TAMIFLU) 75 MG capsule   Oral   Take 1 capsule (75 mg total) by mouth 2 (two) times daily.   10 capsule   0   . oxyCODONE-acetaminophen (PERCOCET/ROXICET) 5-325 MG per tablet   Oral   Take 2 tablets by mouth every 4 (four) hours as needed for pain.   60 tablet   0   . pravastatin (PRAVACHOL) 10 MG tablet   Oral   Take 1 tablet (10 mg total) by mouth at bedtime.   90 tablet   3   . Vitamin D, Ergocalciferol, (DRISDOL) 50000 UNITS CAPS capsule   Oral   Take 1 capsule (50,000 Units total) by mouth every 7 (seven) days.   4 capsule   6    Triage Vitals: BP 140/71  Pulse 72  Temp(Src) 97.2 F (36.2 C) (Oral)  Resp 20  Ht 5' 8"  (1.727 m)  Wt 217 lb (98.431 kg)  BMI 33.00 kg/m2  SpO2 94%  Physical Exam  Nursing note and vitals reviewed. Constitutional: He is oriented to person, place, and time. He appears well-developed. No distress.  HENT:  Head: Normocephalic and atraumatic.  Eyes: Conjunctivae and EOM are normal.  Cardiovascular: Normal rate and regular rhythm.   Pulmonary/Chest: Effort normal. No stridor. No respiratory distress.  Abdominal: He exhibits no distension.  Musculoskeletal: He exhibits no edema and no tenderness.  Neurological: He is alert and oriented to person, place, and time. No cranial nerve deficit. He exhibits normal muscle tone. Coordination  normal.  No cerebellar deficits  Skin: Skin is warm and dry.  Psychiatric: He has a normal mood and affect.    ED Course  Procedures  (including critical care time)   COORDINATION OF CARE:  12:43 PM- Discussed treatment plan with patient, which includes lab work and imaging, and the patient agreed to the plan.   Labs Review Labs Reviewed  COMPREHENSIVE METABOLIC PANEL - Abnormal; Notable for the following:    Glucose, Bld 182 (*)    Albumin 2.9 (*)    Alkaline Phosphatase 143 (*)    GFR calc non Af Amer 56 (*)    GFR calc Af Amer 65 (*)    All other components within normal limits  LIPASE, BLOOD - Abnormal; Notable for the following:    Lipase 6 (*)    All other components within normal limits  CBC WITH DIFFERENTIAL  LACTIC ACID, PLASMA   Imaging Review Dg Chest 2 View  09/05/2013   CLINICAL DATA:  Chest pain, cough and fever.  EXAM: CHEST  2 VIEW  COMPARISON:  03/20/2013  FINDINGS: Chronic right lung base atelectasis. Lungs are otherwise clear. Elevated right hemidiaphragm is stable. No pleural effusion or pneumothorax. Cardiac silhouette is normal in size. Mildly uncoiled aorta.  Intact bony thorax.  IMPRESSION: No acute cardiopulmonary disease.   Electronically Signed   By: Lajean Manes M.D.   On: 09/05/2013 13:49    EKG Interpretation    Date/Time:  Sunday September 05 2013 13:29:22 EST Ventricular Rate:  63 PR Interval:  198 QRS Duration: 102 QT Interval:  422 QTC Calculation: 431 R Axis:   -46 Text Interpretation:  Normal sinus rhythm Left anterior fascicular block Abnormal ECG When compared with ECG of 30-Jul-2012 12:14, No significant change was found Sinus rhythm Left anterior fasicular block Abnormal ekg Confirmed by Carmin Muskrat  MD ([id number]) on 09/05/2013 1:56:03 PM            MDM   1. URI (upper respiratory infection)     I personally performed the services described in this documentation, which was scribed in my presence. The recorded information has been reviewed and is accurate.  Patient presents with concern of ongoing cough, congestion.  On exam patient is awake, alert,  hemodynamically stable, in no distress.  Patient's evaluation is largely reassuring, with no notable acute findings, and the patient remained hemodynamically stable, in no distress throughout his ED course.  Patient is stable for PMD followup.  He was started on a new symptomatic medication program, discharged to follow up with PMD tomorrow, as previously scheduled   Carmin Muskrat, MD 09/05/13 1546

## 2013-09-06 ENCOUNTER — Ambulatory Visit (HOSPITAL_COMMUNITY)
Admission: RE | Admit: 2013-09-06 | Discharge: 2013-09-06 | Disposition: A | Discharge status: Home / Self Care | Payer: Medicare HMO | Source: Ambulatory Visit | Attending: Family Medicine | Admitting: Family Medicine

## 2013-09-06 DIAGNOSIS — R270 Ataxia, unspecified: Secondary | ICD-10-CM

## 2013-09-06 DIAGNOSIS — R4789 Other speech disturbances: Secondary | ICD-10-CM | POA: Insufficient documentation

## 2013-09-06 DIAGNOSIS — G319 Degenerative disease of nervous system, unspecified: Secondary | ICD-10-CM | POA: Insufficient documentation

## 2013-09-06 DIAGNOSIS — R5383 Other fatigue: Secondary | ICD-10-CM

## 2013-09-06 DIAGNOSIS — R279 Unspecified lack of coordination: Secondary | ICD-10-CM | POA: Insufficient documentation

## 2013-09-06 DIAGNOSIS — R5381 Other malaise: Secondary | ICD-10-CM | POA: Insufficient documentation

## 2013-09-06 DIAGNOSIS — E119 Type 2 diabetes mellitus without complications: Secondary | ICD-10-CM

## 2013-09-06 DIAGNOSIS — I10 Essential (primary) hypertension: Secondary | ICD-10-CM

## 2013-09-06 DIAGNOSIS — I6789 Other cerebrovascular disease: Secondary | ICD-10-CM | POA: Insufficient documentation

## 2013-09-06 MED ORDER — GADOBENATE DIMEGLUMINE 529 MG/ML IV SOLN
20.0000 mL | Freq: Once | INTRAVENOUS | Status: AC | PRN
  Administered 2013-09-06: 20 mL via INTRAVENOUS

## 2013-09-09 ENCOUNTER — Other Ambulatory Visit: Payer: Self-pay | Admitting: Family Medicine

## 2013-09-09 DIAGNOSIS — R270 Ataxia, unspecified: Secondary | ICD-10-CM

## 2013-09-13 ENCOUNTER — Other Ambulatory Visit: Payer: Self-pay | Admitting: Family Medicine

## 2013-09-14 ENCOUNTER — Other Ambulatory Visit: Payer: Self-pay | Admitting: Family Medicine

## 2013-09-17 ENCOUNTER — Telehealth: Payer: Self-pay | Admitting: Family Medicine

## 2013-09-17 NOTE — Telephone Encounter (Signed)
Please call him he has questions about the appt that has been made with Dr. Merlene Laughter

## 2013-09-18 ENCOUNTER — Telehealth: Payer: Self-pay | Admitting: Family Medicine

## 2013-09-24 NOTE — Telephone Encounter (Signed)
lmtrc

## 2013-09-29 MED ORDER — BLOOD GLUCOSE METER KIT: PACK | Status: DC

## 2013-09-29 MED ORDER — GLUCOSE BLOOD VI STRP: ORAL_STRIP | Status: DC

## 2013-09-29 NOTE — Telephone Encounter (Signed)
New meter and strips sent to [facility] - Patient aware

## 2013-11-03 ENCOUNTER — Ambulatory Visit (INDEPENDENT_AMBULATORY_CARE_PROVIDER_SITE_OTHER): Payer: Medicare HMO | Admitting: Family Medicine

## 2013-11-03 ENCOUNTER — Encounter: Payer: Self-pay | Admitting: Family Medicine

## 2013-11-03 VITALS — BP 120/70 | HR 82 | Temp 97.3°F | Resp 18 | Ht 69.0 in | Wt 212.0 lb

## 2013-11-03 DIAGNOSIS — I10 Essential (primary) hypertension: Secondary | ICD-10-CM

## 2013-11-03 DIAGNOSIS — IMO0001 Reserved for inherently not codable concepts without codable children: Secondary | ICD-10-CM

## 2013-11-03 DIAGNOSIS — E1165 Type 2 diabetes mellitus with hyperglycemia: Secondary | ICD-10-CM

## 2013-11-03 MED ORDER — VALSARTAN 80 MG PO TABS: 80.0000 mg | ORAL_TABLET | Freq: Every day | ORAL | Status: DC

## 2013-11-03 NOTE — Progress Notes (Signed)
Subjective:    Patient ID: [name], male    DOB: 1941/02/23, 73 y.o.   MRN: [id number]  HPI Patient brings in blood pressures from home that show an average of 101-751 systolic in the evenings. Certainly his blood sugars well controlled. His average blood sugar in the morning ranges between 70 and 110 fasting and between 130 and 180 postprandial in the evening. He is having very few hypoglycemic episodes. His primary concern today is his elevated blood pressure given his history of coronary artery disease. He is currently on Coreg 25 mg by mouth twice a day, doxazosin 8 mg by mouth daily, and losartan 12.5 mg by mouth every morning.  He has a history of acute renal failure and hyperkalemia on higher doses of losartan. Past Medical History  Diagnosis Date  . Type 2 diabetes mellitus   . Essential hypertension, benign   . Prostate cancer 2011  . Mixed hyperlipidemia   . Chronic back pain     Disc disease  . Pancreatic insufficiency     Diagnosed at [facility]  . Diabetes mellitus   . Coronary atherosclerosis of native coronary artery     Coronary calcifications by chest CT, Myoview demonstrating inferolateral scar  . Diastolic dysfunction 0/2585    Grade 1.  . Hyperkalemia   . Kidney stones   . Chronic kidney disease    Current Outpatient Prescriptions on File Prior to Visit  Medication Sig Dispense Refill  . albuterol (PROVENTIL HFA;VENTOLIN HFA) 108 (90 BASE) MCG/ACT inhaler Inhale 1-2 puffs into the lungs every 6 (six) hours as needed for wheezing.  1 Inhaler  0  . aspirin EC 81 MG tablet Take 81 mg by mouth at bedtime.       . Blood Glucose Monitoring Suppl (BLOOD GLUCOSE METER) kit Use as instructed  1 each  0  . carvedilol (COREG) 25 MG tablet Take 1 tablet (25 mg total) by mouth 2 (two) times daily.  180 tablet  1  . citalopram (CELEXA) 20 MG tablet TAKE ONE TABLET BY MOUTH EVERY DAY  90 tablet  1  . CREON 12000 UNITS CPEP capsule TAKE ONE (1) CAPSULE THREE (3) TIMES EACH  DAY  270 each  3  . diazepam (VALIUM) 5 MG tablet TAKE ONE TABLET BY MOUTH TWICE DAILY AS NEEDED --  **USE  SPARINGLY**  60 tablet  2  . doxazosin (CARDURA) 8 MG tablet Take 1 tablet (8 mg total) by mouth daily.  30 tablet  3  . gabapentin (NEURONTIN) 300 MG capsule Take 2 capsules (600 mg total) by mouth at bedtime.  180 capsule  3  . glucose blood test strip Check BS tid-qid DX - 250.00 - NPI [id number]  300 each  4  . insulin glargine (LANTUS) 100 UNIT/ML injection Inject 0.35 mLs (35 Units total) into the skin 2 (two) times daily.  30 mL  5  . insulin regular (NOVOLIN R,HUMULIN R) 100 units/mL injection Inject 10-15 Units into the skin every evening. At dinner.      Marland Kitchen losartan (COZAAR) 25 MG tablet TAKE 1/2 TABLET BY MOUTH ONCE DAILY      . Multiple Vitamin (MULTIVITAMIN WITH MINERALS) TABS Take 1 tablet by mouth daily.      . naphazoline-glycerin (CLEAR EYES) 0.012-0.2 % SOLN Place 2 drops into both eyes 2 (two) times daily as needed for irritation.      Marland Kitchen Phenylephrine-DM-GG-APAP (TYLENOL COLD/FLU SEVERE PO) Take 2 tablets by mouth every 4 (four) hours as  needed (cold).      . pravastatin (PRAVACHOL) 10 MG tablet Take 1 tablet (10 mg total) by mouth at bedtime.  90 tablet  3  . Vitamin D, Ergocalciferol, (DRISDOL) 50000 UNITS CAPS capsule Take 1 capsule (50,000 Units total) by mouth every 7 (seven) days.  4 capsule  6   No current facility-administered medications on file prior to visit.   Allergies  Allergen Reactions  . Enalapril Maleate Cough   History   Social History  . Marital Status: Married    Spouse Name: N/A    Number of Children: N/A  . Years of Education: N/A   Occupational History  . Not on file.   Social History Main Topics  . Smoking status: Never Smoker   . Smokeless tobacco: Never Used  . Alcohol Use: No  . Drug Use: No  . Sexual Activity: Not on file   Other Topics Concern  . Not on file   Social History Narrative  . No narrative on file       Review of Systems  All other systems reviewed and are negative.       Objective:   Physical Exam  Vitals reviewed. Constitutional: He appears well-developed and well-nourished.  Cardiovascular: Normal rate, regular rhythm and normal heart sounds.   No murmur heard. Pulmonary/Chest: Effort normal and breath sounds normal. No respiratory distress. He has no wheezes. He has no rales.  Abdominal: Soft. Bowel sounds are normal. He exhibits no distension. There is no tenderness. There is no rebound and no guarding.  Musculoskeletal: He exhibits no edema.          Assessment & Plan:  1. HTN (hypertension) Discontinue losartan and switch the patient to Diovan 80 mg by mouth every morning. Return fasting in 2 weeks for a CMP, fasting lipid panel, and hemoglobin A1c.  2. Type II or unspecified type diabetes mellitus without mention of complication, uncontrolled Blood sugars are outstanding.  Not make any change in his insulin at the present time. Return for fasting lab work as dictated above.

## 2013-11-08 ENCOUNTER — Encounter (HOSPITAL_COMMUNITY): Payer: Self-pay | Admitting: Emergency Medicine

## 2013-11-08 ENCOUNTER — Emergency Department (HOSPITAL_COMMUNITY)
Admission: EM | Admit: 2013-11-08 | Discharge: 2013-11-08 | Disposition: A | Discharge status: Home / Self Care | Payer: Medicare HMO | Attending: Emergency Medicine | Admitting: Emergency Medicine

## 2013-11-08 DIAGNOSIS — G8929 Other chronic pain: Secondary | ICD-10-CM | POA: Diagnosis not present

## 2013-11-08 DIAGNOSIS — Z8719 Personal history of other diseases of the digestive system: Secondary | ICD-10-CM | POA: Insufficient documentation

## 2013-11-08 DIAGNOSIS — Y9389 Activity, other specified: Secondary | ICD-10-CM | POA: Diagnosis not present

## 2013-11-08 DIAGNOSIS — Z794 Long term (current) use of insulin: Secondary | ICD-10-CM | POA: Diagnosis not present

## 2013-11-08 DIAGNOSIS — I129 Hypertensive chronic kidney disease with stage 1 through stage 4 chronic kidney disease, or unspecified chronic kidney disease: Secondary | ICD-10-CM | POA: Insufficient documentation

## 2013-11-08 DIAGNOSIS — S61409A Unspecified open wound of unspecified hand, initial encounter: Secondary | ICD-10-CM | POA: Insufficient documentation

## 2013-11-08 DIAGNOSIS — Y929 Unspecified place or not applicable: Secondary | ICD-10-CM | POA: Diagnosis not present

## 2013-11-08 DIAGNOSIS — E782 Mixed hyperlipidemia: Secondary | ICD-10-CM | POA: Diagnosis not present

## 2013-11-08 DIAGNOSIS — Z87442 Personal history of urinary calculi: Secondary | ICD-10-CM | POA: Insufficient documentation

## 2013-11-08 DIAGNOSIS — Z7982 Long term (current) use of aspirin: Secondary | ICD-10-CM | POA: Insufficient documentation

## 2013-11-08 DIAGNOSIS — Z792 Long term (current) use of antibiotics: Secondary | ICD-10-CM | POA: Diagnosis not present

## 2013-11-08 DIAGNOSIS — I2510 Atherosclerotic heart disease of native coronary artery without angina pectoris: Secondary | ICD-10-CM | POA: Diagnosis not present

## 2013-11-08 DIAGNOSIS — W268XXA Contact with other sharp object(s), not elsewhere classified, initial encounter: Secondary | ICD-10-CM | POA: Diagnosis not present

## 2013-11-08 DIAGNOSIS — Z8546 Personal history of malignant neoplasm of prostate: Secondary | ICD-10-CM | POA: Diagnosis not present

## 2013-11-08 DIAGNOSIS — Z79899 Other long term (current) drug therapy: Secondary | ICD-10-CM | POA: Insufficient documentation

## 2013-11-08 DIAGNOSIS — S61411A Laceration without foreign body of right hand, initial encounter: Secondary | ICD-10-CM

## 2013-11-08 DIAGNOSIS — N189 Chronic kidney disease, unspecified: Secondary | ICD-10-CM | POA: Diagnosis not present

## 2013-11-08 DIAGNOSIS — E119 Type 2 diabetes mellitus without complications: Secondary | ICD-10-CM | POA: Diagnosis not present

## 2013-11-08 MED ORDER — CEPHALEXIN 500 MG PO CAPS: 500.0000 mg | ORAL_CAPSULE | Freq: Four times a day (QID) | ORAL | Status: DC

## 2013-11-08 MED ORDER — LIDOCAINE HCL (PF) 1 % IJ SOLN
5.0000 mL | Freq: Once | INTRAMUSCULAR | Status: AC
  Administered 2013-11-08: 5 mL
  Filled 2013-11-08: qty 5

## 2013-11-08 NOTE — ED Notes (Signed)
Pt arrives with c/o right hand laceration, states he was installing windows and dropped glass resulting in a 3-4 mm laceration. NAD, denies pain.

## 2013-11-08 NOTE — ED Notes (Signed)
Pt states he was trying to get window back in place and hand hit track and cut top of right hand, hand bandaged in triage and dsg clean and dry, unknown of last tetanus shot

## 2013-11-08 NOTE — ED Provider Notes (Signed)
CSN: [id number]     Arrival date & time 11/08/13  1129 History  This chart was scribed for non-physician practitioner Evalee Jefferson, PA-C working with Ezequiel Essex, MD by Zettie Pho, ED Scribe. This patient was seen in room APFT22/APFT22 and the patient's care was started at 12:30 PM.    Chief Complaint  Patient presents with  . Extremity Laceration   The history is provided by the patient and the spouse. No language interpreter was used.   HPI Comments: [name] is a 73 y.o. male who presents to the Emergency Department complaining of a small laceration to the dorsal aspect of the right hand that he sustained PTA by catching his hand on the sash of a window. His wife reports that the wound bled a significant amount initially, but patient applied pressure to the area PTA and the bleeding is well-controlled at this time. He denies weakness or numbness in his fingers distal to the injury site.  He reports that his last tetanus vaccination was about 3 years ago and is UTD. He denies any allergies to medications. Patient has a history of well-controlled type II DM, HTN, hyperlipidemia, prostate cancer, pancreatic insufficiency, coronary atherosclerosis of native coronary artery, diastolic dysfunction, hyperkalemia, and chronic kidney disease.   Past Medical History  Diagnosis Date  . Type 2 diabetes mellitus   . Essential hypertension, benign   . Prostate cancer 2011  . Mixed hyperlipidemia   . Chronic back pain     Disc disease  . Pancreatic insufficiency     Diagnosed at [facility]  . Diabetes mellitus   . Coronary atherosclerosis of native coronary artery     Coronary calcifications by chest CT, Myoview demonstrating inferolateral scar  . Diastolic dysfunction 01/4402    Grade 1.  . Hyperkalemia   . Kidney stones   . Chronic kidney disease    Past Surgical History  Procedure Laterality Date  . Prostatectomy  2011  . Hernia repair    . Appendectomy    . Cholecystectomy    .  Bilateral hip replacement     Family History  Problem Relation Age of Onset  . Heart attack Father   . Diabetes type II Mother    History  Substance Use Topics  . Smoking status: Never Smoker   . Smokeless tobacco: Never Used  . Alcohol Use: No    Review of Systems  Constitutional: Negative for fever.  Musculoskeletal: Negative for arthralgias, joint swelling and myalgias.  Skin: Positive for wound (laceration).  Neurological: Negative for weakness and numbness.   Allergies  Enalapril maleate  Home Medications   Current Outpatient Rx  Name  Route  Sig  Dispense  Refill  . albuterol (PROVENTIL HFA;VENTOLIN HFA) 108 (90 BASE) MCG/ACT inhaler   Inhalation   Inhale 1-2 puffs into the lungs every 6 (six) hours as needed for wheezing.   1 Inhaler   0   . aspirin EC 81 MG tablet   Oral   Take 81 mg by mouth at bedtime.          . carvedilol (COREG) 25 MG tablet   Oral   Take 1 tablet (25 mg total) by mouth 2 (two) times daily.   180 tablet   1   . citalopram (CELEXA) 20 MG tablet      TAKE ONE TABLET BY MOUTH EVERY DAY   90 tablet   1   . CREON 12000 UNITS CPEP capsule      TAKE ONE (  1) CAPSULE THREE (3) TIMES EACH DAY   270 each   3   . diazepam (VALIUM) 5 MG tablet      TAKE ONE TABLET BY MOUTH TWICE DAILY AS NEEDED --  **USE  SPARINGLY**   60 tablet   2   . doxazosin (CARDURA) 8 MG tablet   Oral   Take 1 tablet (8 mg total) by mouth daily.   30 tablet   3   . gabapentin (NEURONTIN) 300 MG capsule   Oral   Take 2 capsules (600 mg total) by mouth at bedtime.   180 capsule   3   . insulin glargine (LANTUS) 100 UNIT/ML injection   Subcutaneous   Inject 0.35 mLs (35 Units total) into the skin 2 (two) times daily.   30 mL   5     Change in dosage - DX: 250.00   . insulin regular (NOVOLIN R,HUMULIN R) 100 units/mL injection   Subcutaneous   Inject 10-15 Units into the skin every evening. At dinner.         Marland Kitchen losartan (COZAAR) 25 MG  tablet      TAKE 1/2 TABLET BY MOUTH ONCE DAILY         . Multiple Vitamin (MULTIVITAMIN WITH MINERALS) TABS   Oral   Take 1 tablet by mouth daily.         . naphazoline-glycerin (CLEAR EYES) 0.012-0.2 % SOLN   Both Eyes   Place 2 drops into both eyes 2 (two) times daily as needed for irritation.         . pravastatin (PRAVACHOL) 10 MG tablet   Oral   Take 1 tablet (10 mg total) by mouth at bedtime.   90 tablet   3   . valsartan (DIOVAN) 80 MG tablet   Oral   Take 1 tablet (80 mg total) by mouth daily.   30 tablet   3   . Vitamin D, Ergocalciferol, (DRISDOL) 50000 UNITS CAPS capsule   Oral   Take 1 capsule (50,000 Units total) by mouth every 7 (seven) days.   4 capsule   6   . cephALEXin (KEFLEX) 500 MG capsule   Oral   Take 1 capsule (500 mg total) by mouth 4 (four) times daily.   28 capsule   0    Triage Vitals: BP 153/76  Pulse 80  Temp(Src) 98.3 F (36.8 C) (Oral)  Resp 16  Ht 5' 8"  (1.727 m)  Wt 212 lb (96.163 kg)  BMI 32.24 kg/m2  SpO2 95%  Physical Exam  Constitutional: He is oriented to person, place, and time. He appears well-developed and well-nourished.  HENT:  Head: Normocephalic.  Cardiovascular: Normal rate.   Pulmonary/Chest: Effort normal.  Musculoskeletal: Normal range of motion.  Full range of motion of all fingers of the right hand.   Neurological: He is alert and oriented to person, place, and time. No sensory deficit.  Distal sensation intact.   Skin: Laceration noted.  2.5 cm, L-shaped laceration to the dorsal aspect of the right hand.    ED Course  Procedures (including critical care time)  DIAGNOSTIC STUDIES: Oxygen Saturation is 95% on room air, adequate by my interpretation.    COORDINATION OF CARE: 12:34 PM- Discussed that the laceration will ned to be repaired with sutures. Discussed treatment plan with patient at bedside and patient verbalized agreement.   1:35 PM- Repaired the laceration with sutures. Will start  patient on antibiotics to prevent developing infection. Discussed treatment  plan with patient at bedside and patient verbalized agreement.    LACERATION REPAIR PROCEDURE NOTE The patient's identification was confirmed and consent was obtained. This procedure was performed by Evalee Jefferson, PA-C at 1:24 PM. Site: dorsal aspect of right hand Sterile procedures observed Anesthetic used (type and amt): 1% lidocaine without epinephrine, 2.5 cc Suture type/size: 4.0 Ethilon Length: 2.5 cm  # of Sutures: 7 Technique: simple interrupted Complexity: simple Antibx ointment applied Tetanus UTD  Site anesthetized, irrigated with NS, explored without evidence of foreign body, wound well approximated, site covered with dry, sterile dressing.  Patient tolerated procedure well without complications. Instructions for care discussed verbally and patient provided with additional written instructions for homecare and f/u.   Labs Review Labs Reviewed - No data to display Imaging Review No results found.   EKG Interpretation None      MDM   Final diagnoses:  Laceration of hand, right   Wound care instructions given.  Pt advised to have sutures removed in 10 days,  Return here sooner for any signs of infection including redness, swelling, worse pain or drainage of pus.      I personally performed the services described in this documentation, which was scribed in my presence. The recorded information has been reviewed and is accurate.    Evalee Jefferson, PA-C 11/08/13 2148

## 2013-11-08 NOTE — Discharge Instructions (Signed)
Laceration Care, Adult A laceration is a cut that goes through all layers of the skin. The cut goes into the tissue beneath the skin. HOME CARE For stitches (sutures) or staples:  Keep the cut clean and dry.  If you have a bandage (dressing), change it at least once a day. Change the bandage if it gets wet or dirty, or as told by your doctor.  Wash the cut with soap and water 2 times a day. Rinse the cut with water. Pat it dry with a clean towel.  Put a thin layer of medicated cream on the cut as told by your doctor.  You may shower after the first 24 hours. Do not soak the cut in water until the stitches are removed.  Only take medicines as told by your doctor.  Have your stitches or staples removed as told by your doctor. For skin adhesive strips:  Keep the cut clean and dry.  Do not get the strips wet. You may take a bath, but be careful to keep the cut dry.  If the cut gets wet, pat it dry with a clean towel.  The strips will fall off on their own. Do not remove the strips that are still stuck to the cut. For wound glue:  You may shower or take baths. Do not soak or scrub the cut. Do not swim. Avoid heavy sweating until the glue falls off on its own. After a shower or bath, pat the cut dry with a clean towel.  Do not put medicine on your cut until the glue falls off.  If you have a bandage, do not put tape over the glue.  Avoid lots of sunlight or tanning lamps until the glue falls off. Put sunscreen on the cut for the first year to reduce your scar.  The glue will fall off on its own. Do not pick at the glue. You may need a tetanus shot if:  You cannot remember when you had your last tetanus shot.  You have never had a tetanus shot. If you need a tetanus shot and you choose not to have one, you may get tetanus. Sickness from tetanus can be serious. GET HELP RIGHT AWAY IF:   Your pain does not get better with medicine.  Your arm, hand, leg, or foot loses feeling  (numbness) or changes color.  Your cut is bleeding.  Your joint feels weak, or you cannot use your joint.  You have painful lumps on your body.  Your cut is red, puffy (swollen), or painful.  You have a red line on the skin near the cut.  You have yellowish-white fluid (pus) coming from the cut.  You have a fever.  You have a bad smell coming from the cut or bandage.  Your cut breaks open before or after stitches are removed.  You notice something coming out of the cut, such as wood or glass.  You cannot move a finger or toe. MAKE SURE YOU:   Understand these instructions.  Will watch your condition.  Will get help right away if you are not doing well or get worse. Document Released: 01/08/2008 Document Revised: 10/14/2011 Document Reviewed: 01/15/2011 [facility] Patient Information 2014 [place].

## 2013-11-09 NOTE — ED Provider Notes (Signed)
Medical screening examination/treatment/procedure(s) were performed by non-physician practitioner and as supervising physician I was immediately available for consultation/collaboration.   EKG Interpretation None       Ezequiel Essex, MD 11/09/13 [phone]

## 2013-11-18 ENCOUNTER — Encounter: Payer: Self-pay | Admitting: Family Medicine

## 2013-11-18 ENCOUNTER — Ambulatory Visit (INDEPENDENT_AMBULATORY_CARE_PROVIDER_SITE_OTHER): Payer: Medicare HMO | Admitting: Family Medicine

## 2013-11-18 VITALS — BP 146/72 | HR 80 | Temp 97.0°F | Resp 16 | Ht 69.0 in | Wt 214.0 lb

## 2013-11-18 DIAGNOSIS — Z4802 Encounter for removal of sutures: Secondary | ICD-10-CM

## 2013-11-18 DIAGNOSIS — I10 Essential (primary) hypertension: Secondary | ICD-10-CM

## 2013-11-18 LAB — BASIC METABOLIC PANEL WITH GFR
BUN: 30 mg/dL — ABNORMAL HIGH (ref 6–23)
CALCIUM: 8.7 mg/dL (ref 8.4–10.5)
CO2: 26 mEq/L (ref 19–32)
Chloride: 102 mEq/L (ref 96–112)
Creat: 1.77 mg/dL — ABNORMAL HIGH (ref 0.50–1.35)
GFR, EST AFRICAN AMERICAN: 43 mL/min — AB
GFR, Est Non African American: 38 mL/min — ABNORMAL LOW
Glucose, Bld: 382 mg/dL — ABNORMAL HIGH (ref 70–99)
POTASSIUM: 4.7 meq/L (ref 3.5–5.3)
Sodium: 136 mEq/L (ref 135–145)

## 2013-11-18 NOTE — Progress Notes (Signed)
Subjective:    Patient ID: [name], male    DOB: 01/11/1941, 73 y.o.   MRN: [id number]  HPI  Patient suffered a laceration to the dorsum of his hand on April 6. His last tetanus shot was 3 years ago. The laceration is an L-shaped laceration that was closed in the emergency room with simple interrupted sutures. He is here today for suture removal. There is no evidence of cellulitis or infection. The wound is well healed clean dry and intact. Patient's blood pressure still slightly elevated. His blood pressures at home however are significantly elevated in the 160s over 80s. He started valsartan 80 mg by mouth daily 2 weeks ago. Recheck his potassium prior to increasing the dose of valsartan given his history of hyperkalemia. Past Medical History  Diagnosis Date  . Type 2 diabetes mellitus   . Essential hypertension, benign   . Prostate cancer 2011  . Mixed hyperlipidemia   . Chronic back pain     Disc disease  . Pancreatic insufficiency     Diagnosed at [facility]  . Diabetes mellitus   . Coronary atherosclerosis of native coronary artery     Coronary calcifications by chest CT, Myoview demonstrating inferolateral scar  . Diastolic dysfunction 01/8340    Grade 1.  . Hyperkalemia   . Kidney stones   . Chronic kidney disease    Current Outpatient Prescriptions on File Prior to Visit  Medication Sig Dispense Refill  . albuterol (PROVENTIL HFA;VENTOLIN HFA) 108 (90 BASE) MCG/ACT inhaler Inhale 1-2 puffs into the lungs every 6 (six) hours as needed for wheezing.  1 Inhaler  0  . aspirin EC 81 MG tablet Take 81 mg by mouth at bedtime.       . carvedilol (COREG) 25 MG tablet Take 1 tablet (25 mg total) by mouth 2 (two) times daily.  180 tablet  1  . citalopram (CELEXA) 20 MG tablet TAKE ONE TABLET BY MOUTH EVERY DAY  90 tablet  1  . CREON 12000 UNITS CPEP capsule TAKE ONE (1) CAPSULE THREE (3) TIMES EACH DAY  270 each  3  . diazepam (VALIUM) 5 MG tablet TAKE ONE TABLET BY MOUTH TWICE  DAILY AS NEEDED --  **USE  SPARINGLY**  60 tablet  2  . doxazosin (CARDURA) 8 MG tablet Take 1 tablet (8 mg total) by mouth daily.  30 tablet  3  . gabapentin (NEURONTIN) 300 MG capsule Take 2 capsules (600 mg total) by mouth at bedtime.  180 capsule  3  . insulin glargine (LANTUS) 100 UNIT/ML injection Inject 0.35 mLs (35 Units total) into the skin 2 (two) times daily.  30 mL  5  . insulin regular (NOVOLIN R,HUMULIN R) 100 units/mL injection Inject 10-15 Units into the skin every evening. At dinner.      . Multiple Vitamin (MULTIVITAMIN WITH MINERALS) TABS Take 1 tablet by mouth daily.      . naphazoline-glycerin (CLEAR EYES) 0.012-0.2 % SOLN Place 2 drops into both eyes 2 (two) times daily as needed for irritation.      . pravastatin (PRAVACHOL) 10 MG tablet Take 1 tablet (10 mg total) by mouth at bedtime.  90 tablet  3  . valsartan (DIOVAN) 80 MG tablet Take 1 tablet (80 mg total) by mouth daily.  30 tablet  3  . Vitamin D, Ergocalciferol, (DRISDOL) 50000 UNITS CAPS capsule Take 1 capsule (50,000 Units total) by mouth every 7 (seven) days.  4 capsule  6   No current  facility-administered medications on file prior to visit.   Allergies  Allergen Reactions  . Enalapril Maleate Cough   History   Social History  . Marital Status: Married    Spouse Name: N/A    Number of Children: N/A  . Years of Education: N/A   Occupational History  . Not on file.   Social History Main Topics  . Smoking status: Never Smoker   . Smokeless tobacco: Never Used  . Alcohol Use: No  . Drug Use: No  . Sexual Activity: Not on file   Other Topics Concern  . Not on file   Social History Narrative  . No narrative on file     Review of Systems  All other systems reviewed and are negative.      Objective:   Physical Exam  Vitals reviewed. Cardiovascular: Normal rate, regular rhythm and normal heart sounds.   Pulmonary/Chest: Effort normal and breath sounds normal. No respiratory distress.    laceration on the dorsum of the hand is clean dry and intact and well-healed.        Assessment & Plan:  1. Visit for suture removal Sutures removed without difficulty. The wound was reinforced with Steri-Strips. Wound care was discussed.  2. HTN (hypertension) Check the patient's potassium. If his potassium will allow  It, I will increase his valsartan to 160 mg by mouth daily to better control his blood pressure. - BASIC METABOLIC PANEL WITH GFR

## 2013-11-19 ENCOUNTER — Other Ambulatory Visit: Payer: Self-pay | Admitting: Family Medicine

## 2013-11-19 MED ORDER — VALSARTAN 160 MG PO TABS: 160.0000 mg | ORAL_TABLET | Freq: Every day | ORAL | Status: DC

## 2013-11-22 ENCOUNTER — Encounter: Payer: Self-pay | Admitting: Cardiology

## 2013-11-30 ENCOUNTER — Telehealth: Payer: Self-pay | Admitting: Family Medicine

## 2013-11-30 MED ORDER — DOXAZOSIN MESYLATE 8 MG PO TABS: 8.0000 mg | ORAL_TABLET | Freq: Every day | ORAL | Status: DC

## 2013-11-30 NOTE — Telephone Encounter (Signed)
Pt needing a refill for doxazosin sent to [facility].  Pt reporting BP's still 160's/90's and will not be back in town for an additional few weeks what would you like for him to do?

## 2013-11-30 NOTE — Telephone Encounter (Signed)
Add low dose hct 12.5 mg poqday and recheck bp and bmp in 2 weeks.

## 2013-12-01 MED ORDER — HYDROCHLOROTHIAZIDE 12.5 MG PO CAPS: 12.5000 mg | ORAL_CAPSULE | Freq: Every day | ORAL | Status: DC

## 2013-12-01 NOTE — Telephone Encounter (Signed)
Pt aware and med sent to [facility]

## 2014-01-05 DIAGNOSIS — N529 Male erectile dysfunction, unspecified: Secondary | ICD-10-CM | POA: Insufficient documentation

## 2014-01-06 ENCOUNTER — Ambulatory Visit (INDEPENDENT_AMBULATORY_CARE_PROVIDER_SITE_OTHER): Payer: Medicare Other | Admitting: Family Medicine

## 2014-01-06 ENCOUNTER — Encounter: Payer: Self-pay | Admitting: Family Medicine

## 2014-01-06 VITALS — BP 160/90 | HR 80 | Temp 97.4°F | Resp 18 | Ht 69.0 in | Wt 213.0 lb

## 2014-01-06 DIAGNOSIS — I10 Essential (primary) hypertension: Secondary | ICD-10-CM

## 2014-01-06 DIAGNOSIS — L03119 Cellulitis of unspecified part of limb: Secondary | ICD-10-CM

## 2014-01-06 DIAGNOSIS — IMO0001 Reserved for inherently not codable concepts without codable children: Secondary | ICD-10-CM

## 2014-01-06 DIAGNOSIS — E1165 Type 2 diabetes mellitus with hyperglycemia: Secondary | ICD-10-CM

## 2014-01-06 DIAGNOSIS — L03116 Cellulitis of left lower limb: Secondary | ICD-10-CM

## 2014-01-06 DIAGNOSIS — L02419 Cutaneous abscess of limb, unspecified: Secondary | ICD-10-CM

## 2014-01-06 LAB — COMPLETE METABOLIC PANEL WITH GFR
ALBUMIN: 3.7 g/dL (ref 3.5–5.2)
ALK PHOS: 147 U/L — AB (ref 39–117)
ALT: 47 U/L (ref 0–53)
AST: 29 U/L (ref 0–37)
BUN: 22 mg/dL (ref 6–23)
CO2: 24 mEq/L (ref 19–32)
Calcium: 8.3 mg/dL — ABNORMAL LOW (ref 8.4–10.5)
Chloride: 103 mEq/L (ref 96–112)
Creat: 1.33 mg/dL (ref 0.50–1.35)
GFR, Est African American: 61 mL/min
GFR, Est Non African American: 53 mL/min — ABNORMAL LOW
Glucose, Bld: 346 mg/dL — ABNORMAL HIGH (ref 70–99)
POTASSIUM: 5.1 meq/L (ref 3.5–5.3)
Sodium: 133 mEq/L — ABNORMAL LOW (ref 135–145)
TOTAL PROTEIN: 6.2 g/dL (ref 6.0–8.3)
Total Bilirubin: 0.4 mg/dL (ref 0.2–1.2)

## 2014-01-06 LAB — HEMOGLOBIN A1C
Hgb A1c MFr Bld: 9 % — ABNORMAL HIGH (ref <5.7)
Mean Plasma Glucose: 212 mg/dL — ABNORMAL HIGH (ref <117)

## 2014-01-06 MED ORDER — GABAPENTIN 300 MG PO CAPS: 600.0000 mg | ORAL_CAPSULE | Freq: Every day | ORAL | Status: DC

## 2014-01-06 MED ORDER — DIAZEPAM 5 MG PO TABS: ORAL_TABLET | ORAL | Status: DC

## 2014-01-06 MED ORDER — CEPHALEXIN 500 MG PO CAPS: 500.0000 mg | ORAL_CAPSULE | Freq: Three times a day (TID) | ORAL | Status: DC

## 2014-01-06 NOTE — Progress Notes (Signed)
Subjective:    Patient ID: [name], male    DOB: 25-Apr-1941, 73 y.o.   MRN: [id number]  Hypertension  Diabetes   11/18/13 Patient suffered a laceration to the dorsum of his hand on April 6. His last tetanus shot was 3 years ago. The laceration is an L-shaped laceration that was closed in the emergency room with simple interrupted sutures. He is here today for suture removal. There is no evidence of cellulitis or infection. The wound is well healed clean dry and intact. Patient's blood pressure still slightly elevated. His blood pressures at home however are significantly elevated in the 160s over 80s. He started valsartan 80 mg by mouth daily 2 weeks ago. Recheck his potassium prior to increasing the dose of valsartan given his history of hyperkalemia.  At that time, my plan was: 1. Visit for suture removal Sutures removed without difficulty. The wound was reinforced with Steri-Strips. Wound care was discussed.  2. HTN (hypertension) Check the patient's potassium. If his potassium will allow  It, I will increase his valsartan to 160 mg by mouth daily to better control his blood pressure, which we did.   - BASIC METABOLIC PANEL WITH GFR  09/07/74 He is on increased dose of valsartan 160 mg by mouth daily, patient continued to have elevated blood pressures. Therefore we added hydrochlorothiazide 12.5 mg by mouth daily. His blood pressures at home since adding hydrochlorothiazide have ranged 1:30 to 145/80-90. Patient is extremely nervous this morning. He believes this is the reason his blood pressures elevated. His blood pressure home is much better this morning. Unfortunately he is now in the donut hole.  His Lantus will cost him $800.  He is unable to afford this. Currently he is using 35 units of Lantus in the morning and 25 units of Lantus in the evening and 10 units of regular insulin in the evening. He would like to switch back to novolin 70/30.  Unfortunately he has a wound on his left  anterior shin that is 8 cm x 6 cm. He scraped the dorsum of his left shin and this has become subsequently infected. There is a central superficial wound surrounded by erythematous warm tender skin. Past Medical History  Diagnosis Date  . Type 2 diabetes mellitus   . Essential hypertension, benign   . Prostate cancer 2011  . Mixed hyperlipidemia   . Chronic back pain     Disc disease  . Pancreatic insufficiency     Diagnosed at [facility]  . Diabetes mellitus   . Coronary atherosclerosis of native coronary artery     Coronary calcifications by chest CT, Myoview demonstrating inferolateral scar  . Diastolic dysfunction 09/8313    Grade 1.  . Hyperkalemia   . Kidney stones   . Chronic kidney disease    Current Outpatient Prescriptions on File Prior to Visit  Medication Sig Dispense Refill  . albuterol (PROVENTIL HFA;VENTOLIN HFA) 108 (90 BASE) MCG/ACT inhaler Inhale 1-2 puffs into the lungs every 6 (six) hours as needed for wheezing.  1 Inhaler  0  . aspirin EC 81 MG tablet Take 81 mg by mouth at bedtime.       . carvedilol (COREG) 25 MG tablet Take 1 tablet (25 mg total) by mouth 2 (two) times daily.  180 tablet  1  . citalopram (CELEXA) 20 MG tablet TAKE ONE TABLET BY MOUTH EVERY DAY  90 tablet  1  . CREON 12000 UNITS CPEP capsule TAKE ONE (1) CAPSULE THREE (3) TIMES  EACH DAY  270 each  3  . diazepam (VALIUM) 5 MG tablet TAKE ONE TABLET BY MOUTH TWICE DAILY AS NEEDED --  **USE  SPARINGLY**  60 tablet  2  . doxazosin (CARDURA) 8 MG tablet Take 1 tablet (8 mg total) by mouth daily.  30 tablet  3  . hydrochlorothiazide (MICROZIDE) 12.5 MG capsule Take 1 capsule (12.5 mg total) by mouth daily.  30 capsule  3  . insulin glargine (LANTUS) 100 UNIT/ML injection Inject 0.35 mLs (35 Units total) into the skin 2 (two) times daily.  30 mL  5  . insulin regular (NOVOLIN R,HUMULIN R) 100 units/mL injection Inject 10-15 Units into the skin every evening. At dinner.      . Multiple Vitamin (MULTIVITAMIN  WITH MINERALS) TABS Take 1 tablet by mouth daily.      . naphazoline-glycerin (CLEAR EYES) 0.012-0.2 % SOLN Place 2 drops into both eyes 2 (two) times daily as needed for irritation.      . pravastatin (PRAVACHOL) 10 MG tablet Take 1 tablet (10 mg total) by mouth at bedtime.  90 tablet  3  . valsartan (DIOVAN) 160 MG tablet Take 1 tablet (160 mg total) by mouth daily.  30 tablet  5  . Vitamin D, Ergocalciferol, (DRISDOL) 50000 UNITS CAPS capsule Take 1 capsule (50,000 Units total) by mouth every 7 (seven) days.  4 capsule  6   No current facility-administered medications on file prior to visit.   Allergies  Allergen Reactions  . Enalapril Maleate Cough   History   Social History  . Marital Status: Married    Spouse Name: N/A    Number of Children: N/A  . Years of Education: N/A   Occupational History  . Not on file.   Social History Main Topics  . Smoking status: Never Smoker   . Smokeless tobacco: Never Used  . Alcohol Use: No  . Drug Use: No  . Sexual Activity: Not on file   Other Topics Concern  . Not on file   Social History Narrative  . No narrative on file     Review of Systems  All other systems reviewed and are negative.      Objective:   Physical Exam  Vitals reviewed. Constitutional: He appears well-developed and well-nourished. No distress.  Neck: Neck supple. No thyromegaly present.  Cardiovascular: Normal rate, regular rhythm and normal heart sounds.  Exam reveals no gallop and no friction rub.   No murmur heard. Pulmonary/Chest: Effort normal and breath sounds normal. No respiratory distress. He has no wheezes. He has no rales. He exhibits no tenderness.  Abdominal: Soft. Bowel sounds are normal. He exhibits no distension and no mass. There is no tenderness. There is no rebound and no guarding.  Lymphadenopathy:    He has no cervical adenopathy.  Skin: Skin is warm. He is not diaphoretic. There is erythema.   8 cm x 6 cm patch of cellulitis on left  anterior shin.       Assessment & Plan:  1. Cellulitis of leg, left Recheck in one week if no better or sooner if worse the - cephALEXin (KEFLEX) 500 MG capsule; Take 1 capsule (500 mg total) by mouth 3 (three) times daily.  Dispense: 30 capsule; Refill: 0  2. HTN (hypertension) Although blood pressure is elevated here today, his home blood pressure values are much better. All continued her medications at the present dosages. I will also check a CMP to evaluate his renal function as  well as potassium. - COMPLETE METABOLIC PANEL WITH GFR  3. Type II or unspecified type diabetes mellitus without mention of complication, uncontrolled Discontinue Lantus and regular insulin. Switch the patient to Novolin 70/30 40 units sqam and 20 units sqpm.  Also check hemoglobin A1c. - Hemoglobin A1c

## 2014-01-17 ENCOUNTER — Other Ambulatory Visit: Payer: Self-pay | Admitting: Family Medicine

## 2014-01-18 ENCOUNTER — Other Ambulatory Visit: Payer: Self-pay | Admitting: Family Medicine

## 2014-01-18 MED ORDER — CARVEDILOL 25 MG PO TABS: ORAL_TABLET | ORAL | Status: DC

## 2014-01-18 MED ORDER — PRAVASTATIN SODIUM 10 MG PO TABS: ORAL_TABLET | ORAL | Status: DC

## 2014-01-18 NOTE — Telephone Encounter (Signed)
Rx Refilled  

## 2014-01-25 ENCOUNTER — Ambulatory Visit: Payer: Medicare Other | Admitting: Family Medicine

## 2014-01-25 VITALS — BP 147/81

## 2014-01-25 DIAGNOSIS — I10 Essential (primary) hypertension: Secondary | ICD-10-CM

## 2014-01-25 NOTE — Progress Notes (Signed)
Patient ID: Mark Hose., male   DOB: 12/09/1940, 73 y.o.   MRN: [id number] Patient came to office with his new BP machine he just received from his insurance.  He states the machine is reading very high readings and he is not happy with it.  It was a  [employer] BP machine.  No where did I see where any adjustments could be made.  On memory, BP readings were 170-180//90-100's.  I manually took patient's BP in Left arm and got 130/70.  I then used his machine and got 147/81.  I told patient that was a big difference.  Told him to take machine home and call his insurance and find out how to return this machine for better one.  He says he is going to do that.  I also told patient he is free to stop by our office at any time for one of the nurses to check his BP.

## 2014-02-09 ENCOUNTER — Telehealth: Payer: Self-pay | Admitting: Family Medicine

## 2014-02-09 MED ORDER — GLUCOSE BLOOD VI STRP: ORAL_STRIP | Status: DC

## 2014-02-09 NOTE — Telephone Encounter (Signed)
Strips sent to pharm

## 2014-02-09 NOTE — Telephone Encounter (Signed)
Message copied by Alyson Locket on Wed Feb 09, 2014  5:13 PM ------      Message from: Lenore Manner      Created: Wed Feb 09, 2014 10:12 AM      Regarding: quanitity for test strips      Contact: [phone]       Pt states that the pharmacy is stating they are needing to know the quantity for the test strips (freestyles lights 3x a day)                  ---[facility] ------

## 2014-02-25 ENCOUNTER — Encounter: Payer: Self-pay | Admitting: Family Medicine

## 2014-02-25 ENCOUNTER — Ambulatory Visit (INDEPENDENT_AMBULATORY_CARE_PROVIDER_SITE_OTHER): Payer: Medicare HMO | Admitting: Family Medicine

## 2014-02-25 VITALS — BP 102/64 | HR 80 | Temp 97.7°F | Resp 18 | Ht 69.0 in | Wt 214.0 lb

## 2014-02-25 DIAGNOSIS — IMO0001 Reserved for inherently not codable concepts without codable children: Secondary | ICD-10-CM

## 2014-02-25 DIAGNOSIS — E1165 Type 2 diabetes mellitus with hyperglycemia: Principal | ICD-10-CM

## 2014-02-25 MED ORDER — PIOGLITAZONE HCL 15 MG PO TABS: 15.0000 mg | ORAL_TABLET | Freq: Every day | ORAL | Status: DC

## 2014-02-25 NOTE — Progress Notes (Signed)
Subjective:    Patient ID: Mark Hose., male    DOB: 01/19/1941, 73 y.o.   MRN: [id number]  HPI Patient is here today for followup of his diabetes mellitus type 2. He is currently taking Novolin 70/30 40 units in the morning and 20 units in the evening. His fasting blood sugars tend to range between 120 130 although he has hypoglycemic episodes down into the 50s occasionally in the mornings. Postprandial sugars in the evenings tend to be between 180 and 250. His blood pressure is currently well-controlled. He denies any chest pain shortness of breath or dyspnea on exertion. Past Medical History  Diagnosis Date  . Type 2 diabetes mellitus   . Essential hypertension, benign   . Prostate cancer 2011  . Mixed hyperlipidemia   . Chronic back pain     Disc disease  . Pancreatic insufficiency     Diagnosed at [facility]  . Diabetes mellitus   . Coronary atherosclerosis of native coronary artery     Coronary calcifications by chest CT, Myoview demonstrating inferolateral scar  . Diastolic dysfunction 10/6642    Grade 1.  . Hyperkalemia   . Kidney stones   . Chronic kidney disease    Past Surgical History  Procedure Laterality Date  . Prostatectomy  2011  . Hernia repair    . Appendectomy    . Cholecystectomy    . Bilateral hip replacement     Current Outpatient Prescriptions on File Prior to Visit  Medication Sig Dispense Refill  . albuterol (PROVENTIL HFA;VENTOLIN HFA) 108 (90 BASE) MCG/ACT inhaler Inhale 1-2 puffs into the lungs every 6 (six) hours as needed for wheezing.  1 Inhaler  0  . aspirin EC 81 MG tablet Take 81 mg by mouth at bedtime.       . carvedilol (COREG) 25 MG tablet TAKE ONE TABLET BY MOUTH TWICE DAILY  180 tablet  3  . citalopram (CELEXA) 20 MG tablet TAKE ONE TABLET BY MOUTH EVERY DAY  90 tablet  1  . CREON 12000 UNITS CPEP capsule TAKE ONE (1) CAPSULE THREE (3) TIMES EACH DAY  270 each  3  . diazepam (VALIUM) 5 MG tablet TAKE ONE TABLET BY MOUTH TWICE DAILY  AS NEEDED --  **USE  SPARINGLY**  60 tablet  2  . doxazosin (CARDURA) 8 MG tablet Take 1 tablet (8 mg total) by mouth daily.  30 tablet  3  . gabapentin (NEURONTIN) 300 MG capsule Take 2 capsules (600 mg total) by mouth at bedtime.  180 capsule  3  . glucose blood (FREESTYLE LITE) test strip Checks BS TID - Dx - 250.00  100 each  5  . hydrochlorothiazide (MICROZIDE) 12.5 MG capsule Take 1 capsule (12.5 mg total) by mouth daily.  30 capsule  3  . Multiple Vitamin (MULTIVITAMIN WITH MINERALS) TABS Take 1 tablet by mouth daily.      . naphazoline-glycerin (CLEAR EYES) 0.012-0.2 % SOLN Place 2 drops into both eyes 2 (two) times daily as needed for irritation.      . pravastatin (PRAVACHOL) 10 MG tablet TAKE ONE TABLET BY MOUTH ONCE DAILY AT BEDTIME  90 tablet  3  . valsartan (DIOVAN) 160 MG tablet Take 1 tablet (160 mg total) by mouth daily.  30 tablet  5  . Vitamin D, Ergocalciferol, (DRISDOL) 50000 UNITS CAPS capsule Take 1 capsule (50,000 Units total) by mouth every 7 (seven) days.  4 capsule  6   No current facility-administered medications on  file prior to visit.   Allergies  Allergen Reactions  . Enalapril Maleate Cough   History   Social History  . Marital Status: Married    Spouse Name: N/A    Number of Children: N/A  . Years of Education: N/A   Occupational History  . Not on file.   Social History Main Topics  . Smoking status: Never Smoker   . Smokeless tobacco: Never Used  . Alcohol Use: No  . Drug Use: No  . Sexual Activity: Not on file   Other Topics Concern  . Not on file   Social History Narrative  . No narrative on file      Review of Systems  All other systems reviewed and are negative.      Objective:   Physical Exam  Vitals reviewed. Cardiovascular: Normal rate, regular rhythm and normal heart sounds.   Pulmonary/Chest: Effort normal and breath sounds normal.  Abdominal: Soft. Bowel sounds are normal. He exhibits no distension. There is no  tenderness. There is no rebound and no guarding.  Musculoskeletal: He exhibits no edema.          Assessment & Plan:  1. Type II or unspecified type diabetes mellitus without mention of complication, uncontrolled I will add Actos 15 mg by mouth daily as an insulin sensitizer. However this will help decrease his blood sugars with less chance of hypoglycemia. We may even be able to decrease his total insulin use.  Recheck sugars in one month.  His hemoglobin A1c June 4th was elevated at 9.0

## 2014-03-02 ENCOUNTER — Other Ambulatory Visit: Payer: Self-pay | Admitting: Family Medicine

## 2014-03-02 MED ORDER — CITALOPRAM HYDROBROMIDE 20 MG PO TABS: ORAL_TABLET | ORAL | Status: DC

## 2014-03-02 NOTE — Telephone Encounter (Signed)
Med sent to pharm 

## 2014-03-17 ENCOUNTER — Emergency Department (HOSPITAL_COMMUNITY)
Admission: EM | Admit: 2014-03-17 | Discharge: 2014-03-17 | Disposition: A | Discharge status: Home / Self Care | Payer: Medicare HMO | Attending: Emergency Medicine | Admitting: Emergency Medicine

## 2014-03-17 ENCOUNTER — Encounter (HOSPITAL_COMMUNITY): Payer: Self-pay | Admitting: Emergency Medicine

## 2014-03-17 DIAGNOSIS — Y9389 Activity, other specified: Secondary | ICD-10-CM | POA: Diagnosis not present

## 2014-03-17 DIAGNOSIS — G8929 Other chronic pain: Secondary | ICD-10-CM | POA: Insufficient documentation

## 2014-03-17 DIAGNOSIS — Z8546 Personal history of malignant neoplasm of prostate: Secondary | ICD-10-CM | POA: Diagnosis not present

## 2014-03-17 DIAGNOSIS — Z8719 Personal history of other diseases of the digestive system: Secondary | ICD-10-CM | POA: Diagnosis not present

## 2014-03-17 DIAGNOSIS — T63461A Toxic effect of venom of wasps, accidental (unintentional), initial encounter: Secondary | ICD-10-CM | POA: Insufficient documentation

## 2014-03-17 DIAGNOSIS — I2510 Atherosclerotic heart disease of native coronary artery without angina pectoris: Secondary | ICD-10-CM | POA: Diagnosis not present

## 2014-03-17 DIAGNOSIS — N186 End stage renal disease: Secondary | ICD-10-CM | POA: Insufficient documentation

## 2014-03-17 DIAGNOSIS — E782 Mixed hyperlipidemia: Secondary | ICD-10-CM | POA: Insufficient documentation

## 2014-03-17 DIAGNOSIS — IMO0002 Reserved for concepts with insufficient information to code with codable children: Secondary | ICD-10-CM | POA: Diagnosis not present

## 2014-03-17 DIAGNOSIS — Z96649 Presence of unspecified artificial hip joint: Secondary | ICD-10-CM | POA: Insufficient documentation

## 2014-03-17 DIAGNOSIS — I129 Hypertensive chronic kidney disease with stage 1 through stage 4 chronic kidney disease, or unspecified chronic kidney disease: Secondary | ICD-10-CM | POA: Insufficient documentation

## 2014-03-17 DIAGNOSIS — Z7982 Long term (current) use of aspirin: Secondary | ICD-10-CM | POA: Diagnosis not present

## 2014-03-17 DIAGNOSIS — Y9289 Other specified places as the place of occurrence of the external cause: Secondary | ICD-10-CM | POA: Diagnosis not present

## 2014-03-17 DIAGNOSIS — Z79899 Other long term (current) drug therapy: Secondary | ICD-10-CM | POA: Diagnosis not present

## 2014-03-17 DIAGNOSIS — E119 Type 2 diabetes mellitus without complications: Secondary | ICD-10-CM | POA: Diagnosis not present

## 2014-03-17 DIAGNOSIS — Z87442 Personal history of urinary calculi: Secondary | ICD-10-CM | POA: Insufficient documentation

## 2014-03-17 DIAGNOSIS — Z794 Long term (current) use of insulin: Secondary | ICD-10-CM | POA: Diagnosis not present

## 2014-03-17 DIAGNOSIS — R21 Rash and other nonspecific skin eruption: Secondary | ICD-10-CM | POA: Insufficient documentation

## 2014-03-17 DIAGNOSIS — T6391XA Toxic effect of contact with unspecified venomous animal, accidental (unintentional), initial encounter: Secondary | ICD-10-CM | POA: Insufficient documentation

## 2014-03-17 DIAGNOSIS — T63441A Toxic effect of venom of bees, accidental (unintentional), initial encounter: Secondary | ICD-10-CM

## 2014-03-17 MED ORDER — TRIAMCINOLONE ACETONIDE 0.1 % EX CREA: 1.0000 "application " | TOPICAL_CREAM | Freq: Three times a day (TID) | CUTANEOUS | Status: DC

## 2014-03-17 NOTE — Discharge Instructions (Signed)

## 2014-03-17 NOTE — ED Provider Notes (Signed)
CSN: [id number]     Arrival date & time 03/17/14  1611 History   First MD Initiated Contact with Patient 03/17/14 1633     Chief Complaint  Patient presents with  . Insect Bite     (Consider location/radiation/quality/duration/timing/severity/associated sxs/prior Treatment) The history is provided by the patient.  Zarek L Giacobbe Sr. is a 73 y.o. male who presents to the Emergency Department complaining of six bee stings that occurred at 12:30 pm today.  He took one 61m Benadryl tablet approximately 30 minutes after.  He c/o pain to the his hands, left lower back and lower legs.  He denies shortness of breath, difficulty swallowing or breathing, facial swelling, chest pain or previous anaphylactic reactions to bees.    Past Medical History  Diagnosis Date  . Type 2 diabetes mellitus   . Essential hypertension, benign   . Prostate cancer 2011  . Mixed hyperlipidemia   . Chronic back pain     Disc disease  . Pancreatic insufficiency     Diagnosed at [facility] . Diabetes mellitus   . Coronary atherosclerosis of native coronary artery     Coronary calcifications by chest CT, Myoview demonstrating inferolateral scar  . Diastolic dysfunction 58/2641   Grade 1.  . Hyperkalemia   . Kidney stones   . Chronic kidney disease    Past Surgical History  Procedure Laterality Date  . Prostatectomy  2011  . Hernia repair    . Appendectomy    . Cholecystectomy    . Bilateral hip replacement     Family History  Problem Relation Age of Onset  . Heart attack Father   . Diabetes type II Mother    History  Substance Use Topics  . Smoking status: Never Smoker   . Smokeless tobacco: Never Used  . Alcohol Use: No    Review of Systems  Constitutional: Negative for fever, chills, activity change and appetite change.  HENT: Negative for facial swelling, sore throat and trouble swallowing.   Respiratory: Negative for chest tightness, shortness of breath and wheezing.   Cardiovascular:  Negative for chest pain.  Musculoskeletal: Negative for neck pain and neck stiffness.  Skin: Negative for wound.       Bee stings  Neurological: Negative for dizziness, syncope, weakness, numbness and headaches.  All other systems reviewed and are negative.     Allergies  Enalapril maleate  Home Medications   Prior to Admission medications   Medication Sig Start Date End Date Taking? Authorizing Provider  albuterol (PROVENTIL HFA;VENTOLIN HFA) 108 (90 BASE) MCG/ACT inhaler Inhale 1-2 puffs into the lungs every 6 (six) hours as needed for wheezing. 07/30/12   JMaudry Diego MD  aspirin EC 81 MG tablet Take 81 mg by mouth at bedtime.     Historical Provider, MD  carvedilol (COREG) 25 MG tablet TAKE ONE TABLET BY MOUTH TWICE DAILY 01/18/14   WSusy Frizzle MD  citalopram (CELEXA) 20 MG tablet TAKE ONE TABLET BY MOUTH EVERY DAY 03/02/14   WSusy Frizzle MD  CREON 12000 UNITS CPEP capsule TAKE ONE (1) CAPSULE THREE (3) TIMES EACH DAY 09/13/13   WSusy Frizzle MD  diazepam (VALIUM) 5 MG tablet TAKE ONE TABLET BY MOUTH TWICE DAILY AS NEEDED --  **USE  SPARINGLY** 01/06/14   WSusy Frizzle MD  doxazosin (CARDURA) 8 MG tablet Take 1 tablet (8 mg total) by mouth daily. 11/30/13   WSusy Frizzle MD  gabapentin (NEURONTIN) 300 MG capsule Take  2 capsules (600 mg total) by mouth at bedtime. 01/06/14   [name], MD  glucose blood (FREESTYLE LITE) test strip Checks BS TID - Dx - 250.00 02/09/14   [name], MD  hydrochlorothiazide (MICROZIDE) 12.5 MG capsule Take 1 capsule (12.5 mg total) by mouth daily. 12/01/13   [name], MD  insulin NPH-regular Human (NOVOLIN 70/30) (70-30) 100 UNIT/ML injection Inject into the skin. 40 qam 20 qpm    Historical Provider, MD  Multiple Vitamin (MULTIVITAMIN WITH MINERALS) TABS Take 1 tablet by mouth daily.    Historical Provider, MD  naphazoline-glycerin (CLEAR EYES) 0.012-0.2 % SOLN Place 2 drops into both eyes 2 (two) times daily as  needed for irritation.    Historical Provider, MD  pioglitazone (ACTOS) 15 MG tablet Take 1 tablet (15 mg total) by mouth daily. 02/25/14   [name], MD  pravastatin (PRAVACHOL) 10 MG tablet TAKE ONE TABLET BY MOUTH ONCE DAILY AT BEDTIME 01/18/14   [name], MD  valsartan (DIOVAN) 160 MG tablet Take 1 tablet (160 mg total) by mouth daily. 11/19/13   [name], MD  Vitamin D, Ergocalciferol, (DRISDOL) 50000 UNITS CAPS capsule Take 1 capsule (50,000 Units total) by mouth every 7 (seven) days. 09/01/13   [name], MD   BP 149/84  Pulse 85  Temp(Src) 98.5 F (36.9 C) (Oral)  Resp 18  Ht 5' 9"  (1.753 m)  Wt 212 lb (96.163 kg)  BMI 31.29 kg/m2  SpO2 97% Physical Exam  Nursing note and vitals reviewed. Constitutional: He is oriented to person, place, and time. He appears well-developed and well-nourished. No distress.  HENT:  Head: Normocephalic and atraumatic.  Mouth/Throat: Oropharynx is clear and moist.  Airway patent, no edema   Neck: Normal range of motion. Neck supple.  Cardiovascular: Normal rate, regular rhythm, normal heart sounds and intact distal pulses.   No murmur heard. Pulmonary/Chest: Effort normal and breath sounds normal. No respiratory distress. He has no wheezes. He exhibits no tenderness.  Musculoskeletal: He exhibits no edema and no tenderness.  Lymphadenopathy:    He has no cervical adenopathy.  Neurological: He is alert and oriented to person, place, and time. He exhibits normal muscle tone. Coordination normal.  Skin: Skin is warm. Rash noted. There is erythema.  Erythematous papules to the left lower back, bilateral LE's, right index finger.  No hives or edema.      ED Course  Procedures (including critical care time) Labs Review Labs Reviewed - No data to display  Imaging Review No results found.   EKG Interpretation None      MDM   Final diagnoses:  Bee sting, accidental or unintentional, initial encounter    Pt is  well appearing.  VSS.  Ambulates with steady gait.  He agrees to ice, continue benadryl and triamcinolone cream.  Advised to return if needed and to f/u with his PMD.  He appears stable for d/c    Jniyah Dantuono L. Vanessa [place], PA-C 03/19/14 2147

## 2014-03-17 NOTE — ED Notes (Signed)
Pt alert & oriented x4, stable gait. Patient given discharge instructions, paperwork & prescription(s). Patient  instructed to stop at the registration desk to finish any additional paperwork. Patient verbalized understanding. Pt left department w/ no further questions. 

## 2014-03-17 NOTE — ED Notes (Signed)
Pt reports was stung by several yellow jackets this afternoon around 1230. Pt has generalized sting sites noted in triage. Pt reports took 65m of benadryl around 1330. Pt reports pain/stinging increasing. Airway patent. nad noted.

## 2014-03-21 NOTE — ED Provider Notes (Signed)
Medical screening examination/treatment/procedure(s) were performed by non-physician practitioner and as supervising physician I was immediately available for consultation/collaboration.   EKG Interpretation None        Maudry Diego, MD 03/21/14 [phone]

## 2014-04-21 ENCOUNTER — Ambulatory Visit (INDEPENDENT_AMBULATORY_CARE_PROVIDER_SITE_OTHER): Payer: Medicare HMO | Admitting: Family Medicine

## 2014-04-21 ENCOUNTER — Encounter: Payer: Self-pay | Admitting: Family Medicine

## 2014-04-21 VITALS — BP 150/88 | HR 78 | Temp 98.5°F | Resp 18 | Ht 69.0 in | Wt 217.0 lb

## 2014-04-21 DIAGNOSIS — E1165 Type 2 diabetes mellitus with hyperglycemia: Principal | ICD-10-CM

## 2014-04-21 DIAGNOSIS — IMO0001 Reserved for inherently not codable concepts without codable children: Secondary | ICD-10-CM

## 2014-04-21 MED ORDER — HYDROCHLOROTHIAZIDE 25 MG PO TABS: 25.0000 mg | ORAL_TABLET | Freq: Every day | ORAL | Status: DC

## 2014-04-21 MED ORDER — PIOGLITAZONE HCL 15 MG PO TABS: 15.0000 mg | ORAL_TABLET | Freq: Every day | ORAL | Status: DC

## 2014-04-21 MED ORDER — DOXAZOSIN MESYLATE 8 MG PO TABS: 8.0000 mg | ORAL_TABLET | Freq: Every day | ORAL | Status: DC

## 2014-04-21 MED ORDER — VALSARTAN 160 MG PO TABS: 160.0000 mg | ORAL_TABLET | Freq: Every day | ORAL | Status: DC

## 2014-04-21 NOTE — Addendum Note (Signed)
Addended by: Shary Decamp B on: 04/21/2014 04:16 PM   Modules accepted: Orders, Medications

## 2014-04-21 NOTE — Progress Notes (Signed)
Subjective:    Patient ID: Mark Hose., male    DOB: 1940-08-10, 73 y.o.   MRN: [id number]  HPI 02/25/14 Patient is here today for followup of his diabetes mellitus type 2. He is currently taking Novolin 70/30 40 units in the morning and 20 units in the evening. His fasting blood sugars tend to range between 120 130 although he has hypoglycemic episodes down into the 50s occasionally in the mornings. Postprandial sugars in the evenings tend to be between 180 and 250. His blood pressure is currently well-controlled. He denies any chest pain shortness of breath or dyspnea on exertion.  At that time, my plan was: 1. Type II or unspecified type diabetes mellitus without mention of complication, uncontrolled I will add Actos 15 mg by mouth daily as an insulin sensitizer. Hopefully, this will help decrease his blood sugars with less chance of hypoglycemia. We may even be able to decrease his total insulin use.  Recheck sugars in one month.  His hemoglobin A1c June 4th was elevated at 9.0  04/21/14 Patient is here for follow up.  Since adding Actos, his fasting blood sugars have ranged between 70 and 135. History postprandial sugars are typically 150-180. His blood sugars are less than 200. He continues to have hypoglycemic episodes usually in the afternoon.  His blood pressure has been varying widely between 749 and 449 systolic. Past Medical History  Diagnosis Date  . Type 2 diabetes mellitus   . Essential hypertension, benign   . Prostate cancer 2011  . Mixed hyperlipidemia   . Chronic back pain     Disc disease  . Pancreatic insufficiency     Diagnosed at [facility]  . Diabetes mellitus   . Coronary atherosclerosis of native coronary artery     Coronary calcifications by chest CT, Myoview demonstrating inferolateral scar  . Diastolic dysfunction 01/7590    Grade 1.  . Hyperkalemia   . Kidney stones   . Chronic kidney disease    Past Surgical History  Procedure Laterality Date  .  Prostatectomy  2011  . Hernia repair    . Appendectomy    . Cholecystectomy    . Bilateral hip replacement     Current Outpatient Prescriptions on File Prior to Visit  Medication Sig Dispense Refill  . albuterol (PROVENTIL HFA;VENTOLIN HFA) 108 (90 BASE) MCG/ACT inhaler Inhale 1-2 puffs into the lungs every 6 (six) hours as needed for wheezing.  1 Inhaler  0  . aspirin EC 81 MG tablet Take 81 mg by mouth at bedtime.       . carvedilol (COREG) 25 MG tablet TAKE ONE TABLET BY MOUTH TWICE DAILY  180 tablet  3  . citalopram (CELEXA) 20 MG tablet TAKE ONE TABLET BY MOUTH EVERY DAY  90 tablet  1  . CREON 12000 UNITS CPEP capsule TAKE ONE (1) CAPSULE THREE (3) TIMES EACH DAY  270 each  3  . diazepam (VALIUM) 5 MG tablet TAKE ONE TABLET BY MOUTH TWICE DAILY AS NEEDED --  **USE  SPARINGLY**  60 tablet  2  . doxazosin (CARDURA) 8 MG tablet Take 1 tablet (8 mg total) by mouth daily.  30 tablet  3  . gabapentin (NEURONTIN) 300 MG capsule Take 2 capsules (600 mg total) by mouth at bedtime.  180 capsule  3  . glucose blood (FREESTYLE LITE) test strip Checks BS TID - Dx - 250.00  100 each  5  . hydrochlorothiazide (MICROZIDE) 12.5 MG capsule Take 1  capsule (12.5 mg total) by mouth daily.  30 capsule  3  . insulin NPH-regular Human (NOVOLIN 70/30) (70-30) 100 UNIT/ML injection Inject into the skin. 40 qam 20 qpm      . Multiple Vitamin (MULTIVITAMIN WITH MINERALS) TABS Take 1 tablet by mouth daily.      . naphazoline-glycerin (CLEAR EYES) 0.012-0.2 % SOLN Place 2 drops into both eyes 2 (two) times daily as needed for irritation.      . pioglitazone (ACTOS) 15 MG tablet Take 1 tablet (15 mg total) by mouth daily.  30 tablet  5  . pravastatin (PRAVACHOL) 10 MG tablet TAKE ONE TABLET BY MOUTH ONCE DAILY AT BEDTIME  90 tablet  3  . triamcinolone cream (KENALOG) 0.1 % Apply 1 application topically 3 (three) times daily. To the affected areas  30 g  0  . valsartan (DIOVAN) 160 MG tablet Take 1 tablet (160 mg  total) by mouth daily.  30 tablet  5  . Vitamin D, Ergocalciferol, (DRISDOL) 50000 UNITS CAPS capsule Take 1 capsule (50,000 Units total) by mouth every 7 (seven) days.  4 capsule  6   No current facility-administered medications on file prior to visit.   Allergies  Allergen Reactions  . Enalapril Maleate Cough   History   Social History  . Marital Status: Married    Spouse Name: N/A    Number of Children: N/A  . Years of Education: N/A   Occupational History  . Not on file.   Social History Main Topics  . Smoking status: Never Smoker   . Smokeless tobacco: Never Used  . Alcohol Use: No  . Drug Use: No  . Sexual Activity: Not on file   Other Topics Concern  . Not on file   Social History Narrative  . No narrative on file      Review of Systems  All other systems reviewed and are negative.      Objective:   Physical Exam  Vitals reviewed. Cardiovascular: Normal rate, regular rhythm and normal heart sounds.   Pulmonary/Chest: Effort normal and breath sounds normal.  Abdominal: Soft. Bowel sounds are normal. He exhibits no distension. There is no tenderness. There is no rebound and no guarding.  Musculoskeletal: He exhibits no edema.          Assessment & Plan:  Type II or unspecified type diabetes mellitus without mention of complication, uncontrolled - Plan: Hemoglobin A1c, COMPLETE METABOLIC PANEL WITH GFR   I am happy sugars for the most part except for the hypoglycemic episodes. Decreased 7030-30 units in the morning and 20 units in the evening. I will increase hydrochlorothiazide to 25 mg by mouth daily for his hypertension. Patient also received a flu shot today and I will check lab work

## 2014-04-22 LAB — COMPLETE METABOLIC PANEL WITH GFR
ALT: 32 U/L (ref 0–53)
AST: 30 U/L (ref 0–37)
Albumin: 3.8 g/dL (ref 3.5–5.2)
Alkaline Phosphatase: 102 U/L (ref 39–117)
BUN: 20 mg/dL (ref 6–23)
CALCIUM: 8.8 mg/dL (ref 8.4–10.5)
CHLORIDE: 104 meq/L (ref 96–112)
CO2: 27 meq/L (ref 19–32)
Creat: 1.65 mg/dL — ABNORMAL HIGH (ref 0.50–1.35)
GFR, EST AFRICAN AMERICAN: 47 mL/min — AB
GFR, Est Non African American: 41 mL/min — ABNORMAL LOW
Glucose, Bld: 184 mg/dL — ABNORMAL HIGH (ref 70–99)
Potassium: 4.4 mEq/L (ref 3.5–5.3)
SODIUM: 138 meq/L (ref 135–145)
Total Bilirubin: 0.3 mg/dL (ref 0.2–1.2)
Total Protein: 6.3 g/dL (ref 6.0–8.3)

## 2014-04-22 LAB — HEMOGLOBIN A1C
Hgb A1c MFr Bld: 8.3 % — ABNORMAL HIGH (ref <5.7)
Mean Plasma Glucose: 192 mg/dL — ABNORMAL HIGH (ref <117)

## 2014-04-23 ENCOUNTER — Other Ambulatory Visit: Payer: Self-pay | Admitting: *Deleted

## 2014-04-23 MED ORDER — PIOGLITAZONE HCL 30 MG PO TABS: 30.0000 mg | ORAL_TABLET | Freq: Every day | ORAL | Status: DC

## 2014-05-13 ENCOUNTER — Telehealth: Payer: Self-pay | Admitting: Family Medicine

## 2014-05-13 MED ORDER — CLONIDINE HCL 0.1 MG PO TABS: 0.1000 mg | ORAL_TABLET | Freq: Two times a day (BID) | ORAL | Status: DC

## 2014-05-13 NOTE — Telephone Encounter (Signed)
FBS ----------------------------- PP BS -----------------------------------------BP Morning -----------------------Night 04/21/14 87    61         166/83  92    224     165/89    170/83 120    105     148/77    156/83 105    217     147/78    169/81 100    108     136/75    158/78 110    142     152/77    149/74 140    97         156/78 120    129     120/70    144/79 95    216     127/74    135/72 89    91     134/75    164/83 111    134     158/77    168/80 82    185         166/84 119    185     140/77    167/83 84         132/73       Per Dr. Dennard Schaumann states that his BS's are great however his BP's are still too high. Per [facility] Start Clonodine .28m bid   Pt is aware and med sent to request pharm.

## 2014-05-16 ENCOUNTER — Telehealth: Payer: Self-pay | Admitting: Family Medicine

## 2014-05-16 NOTE — Telephone Encounter (Signed)
Pt had questions about BP and possible side effects. Told pt to check BP at regular times and if he felt funny as in some dizziness etc and if that occurred to call us back. BP today was 119/72 and 116/72. Told pt that those are good and it may take a while for his body to adjust. He was concerned about how many pills for BP he was taking and I told him he needed to discuss that with Dr. Dennard Schaumann.

## 2014-05-16 NOTE — Telephone Encounter (Signed)
[phone]   PT is needing to speak to you about BP

## 2014-05-24 ENCOUNTER — Telehealth: Payer: Self-pay | Admitting: Family Medicine

## 2014-05-24 NOTE — Telephone Encounter (Signed)
Patient is having issues with his blood pressure medication would like for you to call him about this please  [phone]

## 2014-05-24 NOTE — Telephone Encounter (Signed)
Patient aware and is out of town and will just continue medication and has appt 05/31/14

## 2014-05-24 NOTE — Telephone Encounter (Signed)
Pt states that since starting the clonidine his BP is all over the place, sore joints, feet,leg pain, SOB, Swelling in ankles and legs and wants to know if these are side effects to this medication. He has appt next week.

## 2014-05-24 NOTE — Telephone Encounter (Signed)
No, not side effects of that medicine.  If sob, ntbs.

## 2014-05-31 ENCOUNTER — Ambulatory Visit (INDEPENDENT_AMBULATORY_CARE_PROVIDER_SITE_OTHER): Payer: Medicare Other | Admitting: Family Medicine

## 2014-05-31 ENCOUNTER — Encounter: Payer: Self-pay | Admitting: Family Medicine

## 2014-05-31 VITALS — BP 160/82 | HR 74 | Temp 98.3°F | Resp 20 | Ht 69.0 in | Wt 222.0 lb

## 2014-05-31 DIAGNOSIS — I10 Essential (primary) hypertension: Secondary | ICD-10-CM

## 2014-05-31 DIAGNOSIS — J810 Acute pulmonary edema: Secondary | ICD-10-CM

## 2014-05-31 MED ORDER — FUROSEMIDE 40 MG PO TABS: 40.0000 mg | ORAL_TABLET | Freq: Every day | ORAL | Status: DC

## 2014-05-31 NOTE — Progress Notes (Signed)
Subjective:    Patient ID: [name]., male    DOB: 17-Jun-1941, 73 y.o.   MRN: [id number]  HPI 02/25/14 Patient is here today for followup of his diabetes mellitus type 2. He is currently taking Novolin 70/30 40 units in the morning and 20 units in the evening. His fasting blood sugars tend to range between 120 130 although he has hypoglycemic episodes down into the 50s occasionally in the mornings. Postprandial sugars in the evenings tend to be between 180 and 250. His blood pressure is currently well-controlled. He denies any chest pain shortness of breath or dyspnea on exertion.  At that time, my plan was: 1. Type II or unspecified type diabetes mellitus without mention of complication, uncontrolled I will add Actos 15 mg by mouth daily as an insulin sensitizer. Hopefully, this will help decrease his blood sugars with less chance of hypoglycemia. We may even be able to decrease his total insulin use.  Recheck sugars in one month.  His hemoglobin A1c June 4th was elevated at 9.0  04/21/14 Patient is here for follow up.  Since adding Actos, his fasting blood sugars have ranged between 70 and 135. History postprandial sugars are typically 150-180. His blood sugars are less than 200. He continues to have hypoglycemic episodes usually in the afternoon.  His blood pressure has been varying widely between 165 and 537 systolic.  At that time, my plan was: I am happy sugars for the most part except for the hypoglycemic episodes. Decreased 7030-30 units in the morning and 20 units in the evening. I will increase hydrochlorothiazide to 25 mg by mouth daily for his hypertension. Patient also received a flu shot today and I will check lab work  05/31/14 Patient is here today for recheck. His blood sugars have been doing excellent. His fasting blood sugars in the morning typically range 70-120. His evening blood sugars are typically less than 130. He is having hypoglycemic episodes in the middle of the  day around lunchtime where his sugars are dropping into the 40s and 60s. His blood pressure is averaging 150s to 155/70-80. He has gained 5 pounds since starting Actos. He is also having increased shortness of breath. On examination today he has bibasilar crackles in both lungs. He has +1 edema in both legs. Past Medical History  Diagnosis Date  . Type 2 diabetes mellitus   . Essential hypertension, benign   . Prostate cancer 2011  . Mixed hyperlipidemia   . Chronic back pain     Disc disease  . Pancreatic insufficiency     Diagnosed at [facility]  . Diabetes mellitus   . Coronary atherosclerosis of native coronary artery     Coronary calcifications by chest CT, Myoview demonstrating inferolateral scar  . Diastolic dysfunction 11/8268    Grade 1.  . Hyperkalemia   . Kidney stones   . Chronic kidney disease    Past Surgical History  Procedure Laterality Date  . Prostatectomy  2011  . Hernia repair    . Appendectomy    . Cholecystectomy    . Bilateral hip replacement     Current Outpatient Prescriptions on File Prior to Visit  Medication Sig Dispense Refill  . albuterol (PROVENTIL HFA;VENTOLIN HFA) 108 (90 BASE) MCG/ACT inhaler Inhale 1-2 puffs into the lungs every 6 (six) hours as needed for wheezing.  1 Inhaler  0  . aspirin EC 81 MG tablet Take 81 mg by mouth at bedtime.       Marland Kitchen  carvedilol (COREG) 25 MG tablet TAKE ONE TABLET BY MOUTH TWICE DAILY  180 tablet  3  . citalopram (CELEXA) 20 MG tablet TAKE ONE TABLET BY MOUTH EVERY DAY  90 tablet  1  . cloNIDine (CATAPRES) 0.1 MG tablet Take 1 tablet (0.1 mg total) by mouth 2 (two) times daily.  60 tablet  3  . CREON 12000 UNITS CPEP capsule TAKE ONE (1) CAPSULE THREE (3) TIMES EACH DAY  270 each  3  . diazepam (VALIUM) 5 MG tablet TAKE ONE TABLET BY MOUTH TWICE DAILY AS NEEDED --  **USE  SPARINGLY**  60 tablet  2  . doxazosin (CARDURA) 8 MG tablet Take 1 tablet (8 mg total) by mouth daily.  90 tablet  3  . gabapentin (NEURONTIN) 300 MG  capsule Take 2 capsules (600 mg total) by mouth at bedtime.  180 capsule  3  . glucose blood (FREESTYLE LITE) test strip Checks BS TID - Dx - 250.00  100 each  5  . hydrochlorothiazide (HYDRODIURIL) 25 MG tablet Take 1 tablet (25 mg total) by mouth daily.  90 tablet  3  . insulin NPH-regular Human (NOVOLIN 70/30) (70-30) 100 UNIT/ML injection Inject into the skin. 40 qam 20 qpm      . Multiple Vitamin (MULTIVITAMIN WITH MINERALS) TABS Take 1 tablet by mouth daily.      . naphazoline-glycerin (CLEAR EYES) 0.012-0.2 % SOLN Place 2 drops into both eyes 2 (two) times daily as needed for irritation.      . pioglitazone (ACTOS) 30 MG tablet Take 1 tablet (30 mg total) by mouth daily.  90 tablet  3  . pravastatin (PRAVACHOL) 10 MG tablet TAKE ONE TABLET BY MOUTH ONCE DAILY AT BEDTIME  90 tablet  3  . triamcinolone cream (KENALOG) 0.1 % Apply 1 application topically 3 (three) times daily. To the affected areas  30 g  0  . valsartan (DIOVAN) 160 MG tablet Take 1 tablet (160 mg total) by mouth daily.  90 tablet  3  . Vitamin D, Ergocalciferol, (DRISDOL) 50000 UNITS CAPS capsule Take 1 capsule (50,000 Units total) by mouth every 7 (seven) days.  4 capsule  6   No current facility-administered medications on file prior to visit.   Allergies  Allergen Reactions  . Enalapril Maleate Cough   History   Social History  . Marital Status: Married    Spouse Name: N/A    Number of Children: N/A  . Years of Education: N/A   Occupational History  . Not on file.   Social History Main Topics  . Smoking status: Never Smoker   . Smokeless tobacco: Never Used  . Alcohol Use: No  . Drug Use: No  . Sexual Activity: Not on file   Other Topics Concern  . Not on file   Social History Narrative  . No narrative on file      Review of Systems  All other systems reviewed and are negative.      Objective:   Physical Exam  Vitals reviewed. Cardiovascular: Normal rate, regular rhythm and normal heart  sounds.   Pulmonary/Chest: Effort normal. He has rales.  Abdominal: Soft. Bowel sounds are normal. He exhibits no distension. There is no tenderness. There is no rebound and no guarding.  Musculoskeletal: He exhibits edema.          Assessment & Plan:  Acute pulmonary edema - Plan: furosemide (LASIX) 40 MG tablet, DISCONTINUED: furosemide (LASIX) 40 MG tablet  Essential hypertension  Begin Lasix 40 mg by mouth daily. Discontinue hydrochlorothiazide 25 mg by mouth daily. Increase clonidine to 0.2 mg by mouth twice a day recheck blood pressures in 1 week. I'll also have the patient decrease his insulin 7030-25 units in the morning and 20 units in the evening. Patient is instructed to come in for a BMP to monitor his potassium on the Lasix in the next 2 weeks.

## 2014-06-06 ENCOUNTER — Telehealth: Payer: Self-pay | Admitting: Family Medicine

## 2014-06-06 MED ORDER — CLONIDINE HCL 0.2 MG PO TABS: 0.2000 mg | ORAL_TABLET | Freq: Two times a day (BID) | ORAL | Status: DC

## 2014-06-06 MED ORDER — CLONIDINE HCL 0.2 MG PO TABS
0.2000 mg | ORAL_TABLET | Freq: Two times a day (BID) | ORAL | Status: DC
Start: 2014-06-06 — End: 2014-09-12

## 2014-06-06 NOTE — Telephone Encounter (Signed)
Per provider to continue with the Clonidine 0.2 mg BID.  New Rx to pharmacy. Pt aware.

## 2014-06-06 NOTE — Telephone Encounter (Signed)
[phone]  Pt brought in his Blood Pressure readings for Dr Dennard Schaumann and the pt states that he is completely out of Clonidine (he has been taking the double dose that he was told to take)  [facility] in [place]  Pt would like to be called once this has been called in

## 2014-06-27 ENCOUNTER — Other Ambulatory Visit: Payer: Commercial Managed Care - HMO

## 2014-06-27 DIAGNOSIS — Z125 Encounter for screening for malignant neoplasm of prostate: Secondary | ICD-10-CM

## 2014-06-27 DIAGNOSIS — Z79899 Other long term (current) drug therapy: Secondary | ICD-10-CM

## 2014-06-27 LAB — BASIC METABOLIC PANEL
BUN: 37 mg/dL — AB (ref 6–23)
CO2: 24 mEq/L (ref 19–32)
CREATININE: 1.62 mg/dL — AB (ref 0.50–1.35)
Calcium: 8.9 mg/dL (ref 8.4–10.5)
Chloride: 102 mEq/L (ref 96–112)
Glucose, Bld: 225 mg/dL — ABNORMAL HIGH (ref 70–99)
Potassium: 4.7 mEq/L (ref 3.5–5.3)
Sodium: 137 mEq/L (ref 135–145)

## 2014-06-28 LAB — PSA, MEDICARE

## 2014-07-11 ENCOUNTER — Other Ambulatory Visit: Payer: Self-pay | Admitting: Family Medicine

## 2014-07-12 NOTE — Telephone Encounter (Signed)
ok 

## 2014-07-12 NOTE — Telephone Encounter (Signed)
Medication called to pharmacy. 

## 2014-07-12 NOTE — Telephone Encounter (Signed)
Ok to refill??  Last office visit 05/31/2014.  Last refill 01/06/2014, #2 refills.

## 2014-07-15 ENCOUNTER — Telehealth: Payer: Self-pay | Admitting: Family Medicine

## 2014-07-15 NOTE — Telephone Encounter (Signed)
Patient is calling to get rx for diabetic shoes if possible  [name]

## 2014-07-21 ENCOUNTER — Telehealth: Payer: Self-pay | Admitting: *Deleted

## 2014-07-21 NOTE — Telephone Encounter (Signed)
Submitted humana referral thru acuity connect for authorization for Dr. Geroge Baseman at [facility] with authorization number [id number] starting on Jan 12, once receive paper copy will fax to [facility].

## 2014-07-21 NOTE — Telephone Encounter (Signed)
Appt made for diabetic foot exam so we can order diabetic shoes per Medicare guidelines

## 2014-08-02 ENCOUNTER — Encounter: Payer: Self-pay | Admitting: Family Medicine

## 2014-08-02 ENCOUNTER — Ambulatory Visit (HOSPITAL_COMMUNITY)
Admission: RE | Admit: 2014-08-02 | Discharge: 2014-08-02 | Disposition: A | Discharge status: Home / Self Care | Payer: Commercial Managed Care - HMO | Source: Ambulatory Visit | Attending: Family Medicine | Admitting: Family Medicine

## 2014-08-02 ENCOUNTER — Telehealth: Payer: Self-pay | Admitting: Family Medicine

## 2014-08-02 ENCOUNTER — Ambulatory Visit (INDEPENDENT_AMBULATORY_CARE_PROVIDER_SITE_OTHER): Payer: Commercial Managed Care - HMO | Admitting: Family Medicine

## 2014-08-02 VITALS — BP 130/70 | HR 80 | Temp 97.6°F | Resp 18 | Ht 69.0 in | Wt 222.0 lb

## 2014-08-02 DIAGNOSIS — R0602 Shortness of breath: Secondary | ICD-10-CM | POA: Diagnosis not present

## 2014-08-02 DIAGNOSIS — R062 Wheezing: Secondary | ICD-10-CM | POA: Insufficient documentation

## 2014-08-02 DIAGNOSIS — R410 Disorientation, unspecified: Secondary | ICD-10-CM

## 2014-08-02 DIAGNOSIS — E119 Type 2 diabetes mellitus without complications: Secondary | ICD-10-CM

## 2014-08-02 DIAGNOSIS — IMO0002 Reserved for concepts with insufficient information to code with codable children: Secondary | ICD-10-CM

## 2014-08-02 DIAGNOSIS — E1165 Type 2 diabetes mellitus with hyperglycemia: Secondary | ICD-10-CM

## 2014-08-02 LAB — COMPLETE METABOLIC PANEL WITH GFR
ALT: 37 U/L (ref 0–53)
AST: 32 U/L (ref 0–37)
Albumin: 3.4 g/dL — ABNORMAL LOW (ref 3.5–5.2)
Alkaline Phosphatase: 102 U/L (ref 39–117)
BUN: 27 mg/dL — ABNORMAL HIGH (ref 6–23)
CALCIUM: 8.3 mg/dL — AB (ref 8.4–10.5)
CO2: 25 mEq/L (ref 19–32)
CREATININE: 1.84 mg/dL — AB (ref 0.50–1.35)
Chloride: 105 mEq/L (ref 96–112)
GFR, EST AFRICAN AMERICAN: 41 mL/min — AB
GFR, Est Non African American: 36 mL/min — ABNORMAL LOW
GLUCOSE: 442 mg/dL — AB (ref 70–99)
Potassium: 5 mEq/L (ref 3.5–5.3)
Sodium: 138 mEq/L (ref 135–145)
Total Bilirubin: 0.4 mg/dL (ref 0.2–1.2)
Total Protein: 5.9 g/dL — ABNORMAL LOW (ref 6.0–8.3)

## 2014-08-02 LAB — CBC WITH DIFFERENTIAL/PLATELET
Basophils Absolute: 0 10*3/uL (ref 0.0–0.1)
Basophils Relative: 0 % (ref 0–1)
Eosinophils Absolute: 0.2 10*3/uL (ref 0.0–0.7)
Eosinophils Relative: 3 % (ref 0–5)
HEMATOCRIT: 37.3 % — AB (ref 39.0–52.0)
HEMOGLOBIN: 11.6 g/dL — AB (ref 13.0–17.0)
LYMPHS ABS: 1.4 10*3/uL (ref 0.7–4.0)
LYMPHS PCT: 20 % (ref 12–46)
MCH: 31.4 pg (ref 26.0–34.0)
MCHC: 31.1 g/dL (ref 30.0–36.0)
MCV: 100.8 fL — AB (ref 78.0–100.0)
MONO ABS: 0.5 10*3/uL (ref 0.1–1.0)
MPV: 9.6 fL (ref 8.6–12.4)
Monocytes Relative: 8 % (ref 3–12)
NEUTROS PCT: 69 % (ref 43–77)
Neutro Abs: 4.7 10*3/uL (ref 1.7–7.7)
Platelets: 159 10*3/uL (ref 150–400)
RBC: 3.7 MIL/uL — ABNORMAL LOW (ref 4.22–5.81)
RDW: 13 % (ref 11.5–15.5)
WBC: 6.8 10*3/uL (ref 4.0–10.5)

## 2014-08-02 LAB — HEMOGLOBIN A1C
Hgb A1c MFr Bld: 8.5 % — ABNORMAL HIGH (ref <5.7)
MEAN PLASMA GLUCOSE: 197 mg/dL — AB (ref <117)

## 2014-08-02 LAB — MICROALBUMIN, URINE: MICROALB UR: 0.5 mg/dL (ref <2.0)

## 2014-08-02 NOTE — Progress Notes (Signed)
Subjective:    Patient ID: Mark Hose., male    DOB: [birth year], 73 y.o.   MRN: [id number]  HPI Patient originally presented for diabetic foot exam qualify for diabetic shoes. However on examination today he seems cognitively slowed. He is somewhat confused and disoriented. He is oriented to person and place and time. However he is only able to remember 2 out of 3 objects on recall. He is unable to spell world backwards. He is unable to perform serial sevens. His partner reports that over the last 4 days he has become progressively more sleepy and somnolent. Yesterday he had a choking episode where he aspirated some food. Last night he had shaking chills and rigors.  Patient has chronic by basilar crackles however on examination today the crackles are more prominent. They also extend higher up into his lung fields. There is also some rhonchorous breath sounds.    Fasting blood sugars are well controlled. They typically range 70-120 in the morning. Two-hour postprandial sugars are highly variable. Sometimes they can be as low as 70. At other times they're as high as 280. The majority around 200. They're extremely variable depending upon what the patient has eaten. He is not having any episodes of hypoglycemia. His blood pressure for the most part is averaging right around 140/70-80 at home. Past Medical History  Diagnosis Date  . Type 2 diabetes mellitus   . Essential hypertension, benign   . Prostate cancer 2011  . Mixed hyperlipidemia   . Chronic back pain     Disc disease  . Pancreatic insufficiency     Diagnosed at [facility]  . Diabetes mellitus   . Coronary atherosclerosis of native coronary artery     Coronary calcifications by chest CT, Myoview demonstrating inferolateral scar  . Diastolic dysfunction 03/4664    Grade 1.  . Hyperkalemia   . Kidney stones   . Chronic kidney disease    Past Surgical History  Procedure Laterality Date  . Prostatectomy  2011  . Hernia repair      . Appendectomy    . Cholecystectomy    . Bilateral hip replacement     Current Outpatient Prescriptions on File Prior to Visit  Medication Sig Dispense Refill  . albuterol (PROVENTIL HFA;VENTOLIN HFA) 108 (90 BASE) MCG/ACT inhaler Inhale 1-2 puffs into the lungs every 6 (six) hours as needed for wheezing. 1 Inhaler 0  . aspirin EC 81 MG tablet Take 81 mg by mouth at bedtime.     . carvedilol (COREG) 25 MG tablet TAKE ONE TABLET BY MOUTH TWICE DAILY 180 tablet 3  . citalopram (CELEXA) 20 MG tablet TAKE ONE TABLET BY MOUTH EVERY DAY 90 tablet 1  . cloNIDine (CATAPRES) 0.2 MG tablet Take 1 tablet (0.2 mg total) by mouth 2 (two) times daily. 60 tablet 2  . CREON 12000 UNITS CPEP capsule TAKE ONE (1) CAPSULE THREE (3) TIMES EACH DAY 270 each 3  . diazepam (VALIUM) 5 MG tablet TAKE ONE TABLET BY MOUTH TWICE DAILY AS NEEDED --  **USE  SPARINGLY** 60 tablet 0  . doxazosin (CARDURA) 8 MG tablet Take 1 tablet (8 mg total) by mouth daily. 90 tablet 3  . furosemide (LASIX) 40 MG tablet Take 1 tablet (40 mg total) by mouth daily. 30 tablet 3  . gabapentin (NEURONTIN) 300 MG capsule Take 2 capsules (600 mg total) by mouth at bedtime. 180 capsule 3  . glucose blood (FREESTYLE LITE) test strip Checks BS TID - Dx -  250.00 100 each 5  . hydrochlorothiazide (HYDRODIURIL) 25 MG tablet Take 1 tablet (25 mg total) by mouth daily. 90 tablet 3  . insulin NPH-regular Human (NOVOLIN 70/30) (70-30) 100 UNIT/ML injection Inject into the skin. 40 qam 20 qpm    . Multiple Vitamin (MULTIVITAMIN WITH MINERALS) TABS Take 1 tablet by mouth daily.    . naphazoline-glycerin (CLEAR EYES) 0.012-0.2 % SOLN Place 2 drops into both eyes 2 (two) times daily as needed for irritation.    . pioglitazone (ACTOS) 30 MG tablet Take 1 tablet (30 mg total) by mouth daily. 90 tablet 3  . pravastatin (PRAVACHOL) 10 MG tablet TAKE ONE TABLET BY MOUTH ONCE DAILY AT BEDTIME 90 tablet 3  . triamcinolone cream (KENALOG) 0.1 % Apply 1 application  topically 3 (three) times daily. To the affected areas 30 g 0  . valsartan (DIOVAN) 160 MG tablet Take 1 tablet (160 mg total) by mouth daily. 90 tablet 3  . Vitamin D, Ergocalciferol, (DRISDOL) 50000 UNITS CAPS capsule Take 1 capsule (50,000 Units total) by mouth every 7 (seven) days. 4 capsule 6   No current facility-administered medications on file prior to visit.   Allergies  Allergen Reactions  . Enalapril Maleate Cough   History   Social History  . Marital Status: Married    Spouse Name: N/A    Number of Children: N/A  . Years of Education: N/A   Occupational History  . Not on file.   Social History Main Topics  . Smoking status: Never Smoker   . Smokeless tobacco: Never Used  . Alcohol Use: No  . Drug Use: No  . Sexual Activity: Not on file   Other Topics Concern  . Not on file   Social History Narrative      Review of Systems  All other systems reviewed and are negative.      Objective:   Physical Exam  Constitutional: He is oriented to person, place, and time. He appears well-developed and well-nourished.  Neck: Neck supple. No JVD present.  Cardiovascular: Normal rate, regular rhythm, normal heart sounds and intact distal pulses.   No murmur heard. Pulmonary/Chest: Effort normal. He has no decreased breath sounds. He has rhonchi. He has rales in the right middle field, the right lower field, the left middle field and the left lower field.  Abdominal: Soft. Bowel sounds are normal. He exhibits no distension and no mass. There is no tenderness. There is no rebound and no guarding.  Lymphadenopathy:    He has no cervical adenopathy.  Neurological: He is oriented to person, place, and time. He has normal reflexes. He displays normal reflexes. No cranial nerve deficit. He exhibits normal muscle tone. Coordination normal.  Psychiatric: He has a normal mood and affect. His speech is normal. Judgment normal. He is slowed. Thought content is paranoid. Cognition  and memory are normal.  Vitals reviewed.         Assessment & Plan:  Delirium - Plan: DG Chest 2 View  Diabetes mellitus type II, uncontrolled - Plan: COMPLETE METABOLIC PANEL WITH GFR, CBC with Differential, Hemoglobin A1c, Microalbumin, urine  I will check the patient's hemoglobin A1c as well as his urine microalbumin today. His blood pressures well controlled. I will gladly complete paperwork so that the patient can qualify for diabetic shoes. I will send this to his local pharmacy. I am concerned by the patient's delirium. Differential diagnosis includes medication side effect from diazepam/gabapentin/clonidine versus possible aspiration pneumonia. I'll obtain a chest  x-ray. There is any evidence of pneumonia I will treat the patient with Levaquin. I've also asked the patient's wife to go home and count the amount of Valium that is in his bottle. The patient is supposed to be using this medication sparingly. He received 60 tablets December 8. If a substantial amount of those pills are gone the patient may be inadvertently taking the medication too often causing AMS. Further plan will be dictated based on the results of his chest x-ray and the amount of Valium the patient is taking

## 2014-08-02 NOTE — Telephone Encounter (Signed)
Calling to let dr pickard know he has 40 diazepam left [phone] if any questions

## 2014-08-04 NOTE — Telephone Encounter (Signed)
His cough is better and he still feels very sleepy and tired and his BS was 122 this am and he was wondering if the Clonidine could be making him sleepy and lethargic. Per [facility] it can but we will not change it due to his BP's boing out of control.

## 2014-08-04 NOTE — Telephone Encounter (Signed)
Is his mentation improving after correcting blood sugar >400?  How is his cough?  Any more shaking chills?

## 2014-08-08 ENCOUNTER — Telehealth: Payer: Self-pay | Admitting: Family Medicine

## 2014-08-08 NOTE — Telephone Encounter (Signed)
Received Rx for diabetic shoes.  Rx must state "diabetic neuropathy with callus formation" for Medicare to cover.  Foot exam does not mention that??  Need new order and revised note to fill prescription.  If patient does not have calluses, shoes not covered by insurance.

## 2014-08-09 NOTE — Telephone Encounter (Signed)
Fortunately, the patient did not have callous formation but unfortunately then his insurance will not cover the shoes.

## 2014-08-15 NOTE — Telephone Encounter (Signed)
Sadly had to tell patient that he does not qualify under Medicare guidelines for diabetic shoes.

## 2014-08-24 ENCOUNTER — Other Ambulatory Visit: Payer: Self-pay | Admitting: Family Medicine

## 2014-08-30 ENCOUNTER — Encounter: Payer: Self-pay | Admitting: Family Medicine

## 2014-08-30 ENCOUNTER — Ambulatory Visit (INDEPENDENT_AMBULATORY_CARE_PROVIDER_SITE_OTHER): Payer: PPO | Admitting: Family Medicine

## 2014-08-30 VITALS — BP 130/74 | HR 78 | Temp 97.7°F | Resp 18 | Ht 69.0 in | Wt 220.0 lb

## 2014-08-30 DIAGNOSIS — K861 Other chronic pancreatitis: Secondary | ICD-10-CM

## 2014-08-30 DIAGNOSIS — I10 Essential (primary) hypertension: Secondary | ICD-10-CM

## 2014-08-30 MED ORDER — PANCRELIPASE (LIP-PROT-AMYL) 10000 UNITS PO CPEP
1.0000 | ORAL_CAPSULE | Freq: Three times a day (TID) | ORAL | Status: DC
Start: 2014-08-30 — End: 2014-11-03

## 2014-08-30 NOTE — Progress Notes (Signed)
Subjective:    Patient ID: Mark Hose., male    DOB: [birth year], 74 y.o.   MRN: [id number]  HPI   Please see my last office visit. At that time the patient's blood pressure was well controlled. However when I check his blood work, his creatinine had risen to 1.89.  I had the patient discontinue his Lasix 40 mg by mouth daily. Since that time, the patient's blood pressure has steadily risen to 150-160/80s. He also has trace to +1 pitting edema in both legs and increasing dyspnea on exertion with chronic bibasilar crackles and rales.   Past Medical History  Diagnosis Date  . Type 2 diabetes mellitus   . Essential hypertension, benign   . Prostate cancer 2011  . Mixed hyperlipidemia   . Chronic back pain     Disc disease  . Pancreatic insufficiency     Diagnosed at [facility]  . Diabetes mellitus   . Coronary atherosclerosis of native coronary artery     Coronary calcifications by chest CT, Myoview demonstrating inferolateral scar  . Diastolic dysfunction 01/1949    Grade 1.  . Hyperkalemia   . Kidney stones   . Chronic kidney disease    Past Surgical History  Procedure Laterality Date  . Prostatectomy  2011  . Hernia repair    . Appendectomy    . Cholecystectomy    . Bilateral hip replacement     Current Outpatient Prescriptions on File Prior to Visit  Medication Sig Dispense Refill  . albuterol (PROVENTIL HFA;VENTOLIN HFA) 108 (90 BASE) MCG/ACT inhaler Inhale 1-2 puffs into the lungs every 6 (six) hours as needed for wheezing. 1 Inhaler 0  . aspirin EC 81 MG tablet Take 81 mg by mouth at bedtime.     . carvedilol (COREG) 25 MG tablet TAKE ONE TABLET BY MOUTH TWICE DAILY 180 tablet 3  . citalopram (CELEXA) 20 MG tablet TAKE ONE TABLET BY MOUTH ONCE DAILY 90 tablet 3  . cloNIDine (CATAPRES) 0.2 MG tablet Take 1 tablet (0.2 mg total) by mouth 2 (two) times daily. 60 tablet 2  . CREON 12000 UNITS CPEP capsule TAKE ONE (1) CAPSULE THREE (3) TIMES EACH DAY 270 each 3  .  diazepam (VALIUM) 5 MG tablet TAKE ONE TABLET BY MOUTH TWICE DAILY AS NEEDED --  **USE  SPARINGLY** 60 tablet 0  . doxazosin (CARDURA) 8 MG tablet Take 1 tablet (8 mg total) by mouth daily. 90 tablet 3  . gabapentin (NEURONTIN) 300 MG capsule Take 2 capsules (600 mg total) by mouth at bedtime. 180 capsule 3  . glucose blood (FREESTYLE LITE) test strip Checks BS TID - Dx - 250.00 100 each 5  . hydrochlorothiazide (HYDRODIURIL) 25 MG tablet Take 1 tablet (25 mg total) by mouth daily. 90 tablet 3  . insulin NPH-regular Human (NOVOLIN 70/30) (70-30) 100 UNIT/ML injection Inject into the skin. 40 qam 20 qpm    . Multiple Vitamin (MULTIVITAMIN WITH MINERALS) TABS Take 1 tablet by mouth daily.    . naphazoline-glycerin (CLEAR EYES) 0.012-0.2 % SOLN Place 2 drops into both eyes 2 (two) times daily as needed for irritation.    . pioglitazone (ACTOS) 30 MG tablet Take 1 tablet (30 mg total) by mouth daily. 90 tablet 3  . pravastatin (PRAVACHOL) 10 MG tablet TAKE ONE TABLET BY MOUTH ONCE DAILY AT BEDTIME 90 tablet 3  . triamcinolone cream (KENALOG) 0.1 % Apply 1 application topically 3 (three) times daily. To the affected areas 30 g  0  . valsartan (DIOVAN) 160 MG tablet Take 1 tablet (160 mg total) by mouth daily. 90 tablet 3  . Vitamin D, Ergocalciferol, (DRISDOL) 50000 UNITS CAPS capsule Take 1 capsule (50,000 Units total) by mouth every 7 (seven) days. 4 capsule 6  . furosemide (LASIX) 40 MG tablet Take 1 tablet (40 mg total) by mouth daily. (Patient not taking: Reported on 08/30/2014) 30 tablet 3   No current facility-administered medications on file prior to visit.   Allergies  Allergen Reactions  . Enalapril Maleate Cough   History   Social History  . Marital Status: Married    Spouse Name: N/A    Number of Children: N/A  . Years of Education: N/A   Occupational History  . Not on file.   Social History Main Topics  . Smoking status: Never Smoker   . Smokeless tobacco: Never Used  .  Alcohol Use: No  . Drug Use: No  . Sexual Activity: Not on file   Other Topics Concern  . Not on file   Social History Narrative      Review of Systems  All other systems reviewed and are negative.      Objective:   Physical Exam  Constitutional: He appears well-developed and well-nourished.  Cardiovascular: Normal rate, regular rhythm and normal heart sounds.   Pulmonary/Chest: He has rales.  Abdominal: Soft. Bowel sounds are normal.  Musculoskeletal: He exhibits edema.  Vitals reviewed.         Assessment & Plan:  Other chronic pancreatitis - Plan: Pancrelipase, Lip-Prot-Amyl, 10000 UNITS CPEP  Essential hypertension  Patient's insurance is no longer pay for Creon. Therefore I will switch the patient to a different formulary alternative.zenpep 10,000 per meal.  If this does not treat his insufficiency adequately we will increase to 15,000. I will also have the patient to resume Lasix 20 mg by mouth daily. Hopefully we can meet a happy medium with the of early dehydration but did manage to control his edema as well as his blood pressure.  He is scheduled to see the endocrinologist later this week.

## 2014-08-31 ENCOUNTER — Ambulatory Visit: Payer: Commercial Managed Care - HMO | Admitting: Endocrinology

## 2014-09-06 ENCOUNTER — Other Ambulatory Visit: Payer: PPO

## 2014-09-06 DIAGNOSIS — N189 Chronic kidney disease, unspecified: Secondary | ICD-10-CM

## 2014-09-07 LAB — BASIC METABOLIC PANEL
BUN: 38 mg/dL — ABNORMAL HIGH (ref 6–23)
CHLORIDE: 108 meq/L (ref 96–112)
CO2: 28 meq/L (ref 19–32)
Calcium: 9.5 mg/dL (ref 8.4–10.5)
Creat: 1.41 mg/dL — ABNORMAL HIGH (ref 0.50–1.35)
Glucose, Bld: 85 mg/dL (ref 70–99)
Potassium: 4.8 mEq/L (ref 3.5–5.3)
Sodium: 138 mEq/L (ref 135–145)

## 2014-09-12 ENCOUNTER — Other Ambulatory Visit: Payer: Self-pay | Admitting: Family Medicine

## 2014-09-13 ENCOUNTER — Telehealth: Payer: Self-pay | Admitting: Family Medicine

## 2014-09-13 NOTE — Telephone Encounter (Signed)
Med was sent this AM and pt did get medication

## 2014-09-13 NOTE — Telephone Encounter (Signed)
[facility] [place]  Patient says he needs his clonidine TODAY if possible, says that the pharmacy has sent [place] the request as well  Please call him back with any questions  [phone]

## 2014-10-21 ENCOUNTER — Other Ambulatory Visit: Payer: Self-pay | Admitting: Family Medicine

## 2014-11-03 ENCOUNTER — Other Ambulatory Visit: Payer: Self-pay | Admitting: Family Medicine

## 2014-11-03 NOTE — Telephone Encounter (Signed)
Refill appropriate and filled per protocol.l

## 2014-12-12 ENCOUNTER — Encounter: Payer: Self-pay | Admitting: Family Medicine

## 2014-12-12 ENCOUNTER — Ambulatory Visit (INDEPENDENT_AMBULATORY_CARE_PROVIDER_SITE_OTHER): Payer: PPO | Admitting: Family Medicine

## 2014-12-12 VITALS — BP 160/84 | HR 72 | Temp 98.4°F | Resp 20 | Ht 69.0 in | Wt 224.0 lb

## 2014-12-12 DIAGNOSIS — I10 Essential (primary) hypertension: Secondary | ICD-10-CM | POA: Diagnosis not present

## 2014-12-12 DIAGNOSIS — Z794 Long term (current) use of insulin: Secondary | ICD-10-CM

## 2014-12-12 DIAGNOSIS — IMO0001 Reserved for inherently not codable concepts without codable children: Secondary | ICD-10-CM

## 2014-12-12 DIAGNOSIS — E119 Type 2 diabetes mellitus without complications: Secondary | ICD-10-CM

## 2014-12-12 DIAGNOSIS — J810 Acute pulmonary edema: Secondary | ICD-10-CM | POA: Diagnosis not present

## 2014-12-12 LAB — COMPLETE METABOLIC PANEL WITH GFR
ALT: 32 U/L (ref 0–53)
AST: 28 U/L (ref 0–37)
Albumin: 3.8 g/dL (ref 3.5–5.2)
Alkaline Phosphatase: 103 U/L (ref 39–117)
BUN: 22 mg/dL (ref 6–23)
CHLORIDE: 105 meq/L (ref 96–112)
CO2: 23 meq/L (ref 19–32)
CREATININE: 1.35 mg/dL (ref 0.50–1.35)
Calcium: 9.4 mg/dL (ref 8.4–10.5)
GFR, Est African American: 60 mL/min
GFR, Est Non African American: 52 mL/min — ABNORMAL LOW
GLUCOSE: 106 mg/dL — AB (ref 70–99)
Potassium: 4.1 mEq/L (ref 3.5–5.3)
Sodium: 137 mEq/L (ref 135–145)
Total Bilirubin: 0.4 mg/dL (ref 0.2–1.2)
Total Protein: 6.7 g/dL (ref 6.0–8.3)

## 2014-12-12 LAB — LIPID PANEL
CHOL/HDL RATIO: 3 ratio
Cholesterol: 112 mg/dL (ref 0–200)
HDL: 37 mg/dL — ABNORMAL LOW (ref >=40)
LDL Cholesterol: 48 mg/dL (ref 0–99)
Triglycerides: 136 mg/dL (ref <150)
VLDL: 27 mg/dL (ref 0–40)

## 2014-12-12 LAB — HEMOGLOBIN A1C
Hgb A1c MFr Bld: 7.8 % — ABNORMAL HIGH (ref <5.7)
MEAN PLASMA GLUCOSE: 177 mg/dL — AB (ref <117)

## 2014-12-12 MED ORDER — FUROSEMIDE 20 MG PO TABS: 20.0000 mg | ORAL_TABLET | Freq: Every day | ORAL | Status: DC

## 2014-12-12 MED ORDER — VALSARTAN 320 MG PO TABS: 320.0000 mg | ORAL_TABLET | Freq: Every day | ORAL | Status: DC

## 2014-12-12 MED ORDER — GABAPENTIN 300 MG PO CAPS: 600.0000 mg | ORAL_CAPSULE | Freq: Every day | ORAL | Status: DC

## 2014-12-12 NOTE — Progress Notes (Signed)
Subjective:    Patient ID: Mark Hose., male    DOB: 07-08-1941, 74 y.o.   MRN: [id number]  HPI   patient is here today for follow-up of his hypertension as well as insulin-dependent diabetes mellitus. He is currently on 7030 insulin , 25 units in the morning, 20 units at night. He also takes Actos as an insulin sensitizer. The Actos has caused peripheral edema however it is also dropped his sugars drastically. His fasting blood sugars tend to range between 67 and 140.Marland Kitchen He occasionally has a hypoglycemic episode as low as 41. His evening blood sugars range between 50 and 160. He also has a Frequent hypoglycemic episodes and to the 60s at night. Blood pressures ranging 140-160/80. Patient denies any chest pain shortness of breath or dyspnea on exertion Past Medical History  Diagnosis Date  . Type 2 diabetes mellitus   . Essential hypertension, benign   . Prostate cancer 2011  . Mixed hyperlipidemia   . Chronic back pain     Disc disease  . Pancreatic insufficiency     Diagnosed at [facility]  . Diabetes mellitus   . Coronary atherosclerosis of native coronary artery     Coronary calcifications by chest CT, Myoview demonstrating inferolateral scar  . Diastolic dysfunction 01/9677    Grade 1.  . Hyperkalemia   . Kidney stones   . Chronic kidney disease    Past Surgical History  Procedure Laterality Date  . Prostatectomy  2011  . Hernia repair    . Appendectomy    . Cholecystectomy    . Bilateral hip replacement     Current Outpatient Prescriptions on File Prior to Visit  Medication Sig Dispense Refill  . albuterol (PROVENTIL HFA;VENTOLIN HFA) 108 (90 BASE) MCG/ACT inhaler Inhale 1-2 puffs into the lungs every 6 (six) hours as needed for wheezing. 1 Inhaler 0  . aspirin EC 81 MG tablet Take 81 mg by mouth at bedtime.     . carvedilol (COREG) 25 MG tablet TAKE ONE TABLET BY MOUTH TWICE DAILY 180 tablet 3  . citalopram (CELEXA) 20 MG tablet TAKE ONE TABLET BY MOUTH ONCE DAILY  90 tablet 3  . cloNIDine (CATAPRES) 0.2 MG tablet TAKE ONE TABLET BY MOUTH TWICE DAILY 60 tablet 11  . CREON 12000 UNITS CPEP capsule TAKE ONE (1) CAPSULE THREE (3) TIMES EACH DAY 270 each 3  . diazepam (VALIUM) 5 MG tablet TAKE ONE TABLET BY MOUTH TWICE DAILY AS NEEDED --  **USE  SPARINGLY** 60 tablet 0  . doxazosin (CARDURA) 8 MG tablet Take 1 tablet (8 mg total) by mouth daily. 90 tablet 3  . FREESTYLE LITE test strip USE  STRIP TO CHECK BLOOD SUGAR THREE TIMES DAILY 100 each 5  . insulin NPH-regular Human (NOVOLIN 70/30) (70-30) 100 UNIT/ML injection Inject into the skin. 40 qam 20 qpm    . Multiple Vitamin (MULTIVITAMIN WITH MINERALS) TABS Take 1 tablet by mouth daily.    . naphazoline-glycerin (CLEAR EYES) 0.012-0.2 % SOLN Place 2 drops into both eyes 2 (two) times daily as needed for irritation.    . pioglitazone (ACTOS) 30 MG tablet Take 1 tablet (30 mg total) by mouth daily. 90 tablet 3  . pravastatin (PRAVACHOL) 10 MG tablet TAKE ONE TABLET BY MOUTH ONCE DAILY AT BEDTIME 90 tablet 3  . triamcinolone cream (KENALOG) 0.1 % Apply 1 application topically 3 (three) times daily. To the affected areas 30 g 0  . valsartan (DIOVAN) 160 MG tablet Take  1 tablet (160 mg total) by mouth daily. 90 tablet 3  . Vitamin D, Ergocalciferol, (DRISDOL) 50000 UNITS CAPS capsule Take 1 capsule (50,000 Units total) by mouth every 7 (seven) days. 4 capsule 6  . ZENPEP 10000 UNITS CPEP TAKE 1 CAPSULE BY MOUTH THREE TIMES A DAY BEFORE MEALS 90 capsule 2  . hydrochlorothiazide (HYDRODIURIL) 25 MG tablet Take 1 tablet (25 mg total) by mouth daily. (Patient not taking: Reported on 12/12/2014) 90 tablet 3   No current facility-administered medications on file prior to visit.   Allergies  Allergen Reactions  . Enalapril Maleate Cough   History   Social History  . Marital Status: Married    Spouse Name: N/A  . Number of Children: N/A  . Years of Education: N/A   Occupational History  . Not on file.   Social  History Main Topics  . Smoking status: Never Smoker   . Smokeless tobacco: Never Used  . Alcohol Use: No  . Drug Use: No  . Sexual Activity: Not on file   Other Topics Concern  . Not on file   Social History Narrative     Review of Systems  All other systems reviewed and are negative.      Objective:   Physical Exam  Cardiovascular: Normal rate, regular rhythm and normal heart sounds.   Pulmonary/Chest: Effort normal. He has rales.  Abdominal: Soft. Bowel sounds are normal. He exhibits no distension. There is no tenderness. There is no rebound.  Musculoskeletal: He exhibits edema.  Vitals reviewed.         Assessment & Plan:  Acute pulmonary edema - Plan: furosemide (LASIX) 20 MG tablet, DISCONTINUED: furosemide (LASIX) 20 MG tablet  IDDM (insulin dependent diabetes mellitus) - Plan: COMPLETE METABOLIC PANEL WITH GFR, Lipid panel, Hemoglobin A1c, Microalbumin, urine  Benign essential HTN   Patient has no evidence of acute pulmonary edema. This diagnosis is wrong. Instead he is using Lasix 20 mg a day to help manage the swelling caused by the Actos. Sugars are excellent. In fact there are 2 low. Therefore I asked him to decrease his insulin to 20 units in the morning and 15 units in the evening. I will check a hemoglobin A1c and if consistently elevated, I would recommend a referral to an endocrinologist. Blood pressure is elevated and I have asked the patient to increase valsartan to 320 mg a day

## 2014-12-14 LAB — MICROALBUMIN, URINE: Microalb, Ur: 1 mg/dL (ref <2.0)

## 2014-12-26 ENCOUNTER — Other Ambulatory Visit: Payer: Self-pay | Admitting: Family Medicine

## 2014-12-27 NOTE — Telephone Encounter (Signed)
RX called in .

## 2014-12-27 NOTE — Telephone Encounter (Signed)
ok 

## 2014-12-27 NOTE — Telephone Encounter (Signed)
?   OK to Refill  

## 2015-01-12 ENCOUNTER — Encounter: Payer: Self-pay | Admitting: Endocrinology

## 2015-01-12 ENCOUNTER — Ambulatory Visit (INDEPENDENT_AMBULATORY_CARE_PROVIDER_SITE_OTHER): Payer: PPO | Admitting: Endocrinology

## 2015-01-12 VITALS — BP 120/62 | HR 74 | Temp 98.2°F | Resp 16 | Ht 69.0 in | Wt 223.2 lb

## 2015-01-12 DIAGNOSIS — I10 Essential (primary) hypertension: Secondary | ICD-10-CM | POA: Diagnosis not present

## 2015-01-12 DIAGNOSIS — E1165 Type 2 diabetes mellitus with hyperglycemia: Secondary | ICD-10-CM

## 2015-01-12 DIAGNOSIS — IMO0002 Reserved for concepts with insufficient information to code with codable children: Secondary | ICD-10-CM

## 2015-01-12 NOTE — Progress Notes (Signed)
Patient ID: Mark Hose., male   DOB: Jul 10, 1941, 74 y.o.   MRN: [id number]           Reason for Appointment: Consultation for Type 2 Diabetes  Referring physician: Pickard  History of Present Illness:          Date of diagnosis of type 2 diabetes mellitus :        Background history:  The first few years of his diabetes he was treated with Metformin At some point he was also given Actos by his PCP for control However because of worsening control he thinks he was started on insulin probably Lantus in 2005  Recent history:  His Lantus insulin was changed to 70/30 a couple of years ago because of his difficulty affording the Lantus However with this his blood sugars have not been optimally controlled and A1c has been persistently high  His A1c was around 9% in 6/15and at that time he was started back on Actos, initially 15 mg and then 30 mg in September With this his blood sugars improved somewhat He is referred here because of persistently high A1c of 7.8 and 5/16  INSULIN regimen is described as: 70/30 insulin, 25 units acb;  20 units before supper       Current blood sugar patterns and problems identified:  His fasting blood sugars in the last month or so have been generally fairly good with only occasional high readings  He is checking blood sugars only sporadically before supper and these tend to be relatively high, occasionally over 200  His blood sugars after supper are mostly high and are probably averaging about 190-210  No recent hypoglycemia but was starting to get low blood sugars couple months ago during the night and his PCP reduced his insulin dose  He is fairly compliant with taking his insulin before meals but does not wait 30 minutes before eating     Oral hypoglycemic drugs the patient is taking are: Actos 30 mg daily      Side effects from medications have been: Some swelling from Actos treated with Lasix  Compliance with the medical regimen: Fair, he  thinks he can do better on diet Hypoglycemia: None recently, previously was having some nocturnal hypoglycemia    Glucose monitoring:  done  times a day         Glucometer: Lamar Sprinkles    Blood Glucose readings by time of day and averages from home record  PREMEAL Breakfast Lunch Dinner Bedtime  Overall   Glucose range: 95-204  65-234 114-230   Median:         Self-care: The diet that the patient has been following is: tries to limit sweets.     Meal times: Breakfast: Lunch: Dinner: 5 pm. Eating out 3/7  Typical meal intake: Breakfast is Eggs, bacon and egg toast.  Lunch is a sandwich sometimes.  Dinner is meat and vegetables, has peanut butter snacks periodically at lunch or bedtime  Dietician visit, most recent: 15 yrs               Exercise:  none, has back pain  Weight history: 213 in 15  Wt Readings from Last 3 Encounters:  01/12/15 223 lb 3.2 oz (101.243 kg)  12/12/14 224 lb (101.606 kg)  08/30/14 220 lb (99.791 kg)    Glycemic control:   Lab Results  Component Value Date   HGBA1C 7.8* 12/12/2014   HGBA1C 8.5* 08/02/2014   HGBA1C 8.3* 04/21/2014  Lab Results  Component Value Date   MICROALBUR 1.0 12/13/2014   LDLCALC 48 12/12/2014   CREATININE 1.35 12/12/2014         Medication List       This list is accurate as of: 01/12/15  3:02 PM.  Always use your most recent med list.               albuterol 108 (90 BASE) MCG/ACT inhaler  Commonly known as:  PROVENTIL HFA;VENTOLIN HFA  Inhale 1-2 puffs into the lungs every 6 (six) hours as needed for wheezing.     aspirin EC 81 MG tablet  Take 81 mg by mouth at bedtime.     carvedilol 25 MG tablet  Commonly known as:  COREG  TAKE ONE TABLET BY MOUTH TWICE DAILY     citalopram 20 MG tablet  Commonly known as:  CELEXA  TAKE ONE TABLET BY MOUTH ONCE DAILY     cloNIDine 0.2 MG tablet  Commonly known as:  CATAPRES  TAKE ONE TABLET BY MOUTH TWICE DAILY     CREON 12000 UNITS Cpep capsule  Generic drug:   lipase/protease/amylase  TAKE ONE (1) CAPSULE THREE (3) TIMES EACH DAY     ZENPEP 10000 UNITS Cpep  Generic drug:  Pancrelipase (Lip-Prot-Amyl)  TAKE 1 CAPSULE BY MOUTH THREE TIMES A DAY BEFORE MEALS     diazepam 5 MG tablet  Commonly known as:  VALIUM  TAKE ONE TABLET BY MOUTH TWICE DAILY AS NEEDED FOR ANXIETY     doxazosin 8 MG tablet  Commonly known as:  CARDURA  Take 1 tablet (8 mg total) by mouth daily.     FREESTYLE LITE test strip  Generic drug:  glucose blood  USE  STRIP TO CHECK BLOOD SUGAR THREE TIMES DAILY     furosemide 20 MG tablet  Commonly known as:  LASIX  Take 1 tablet (20 mg total) by mouth daily.     gabapentin 300 MG capsule  Commonly known as:  NEURONTIN  Take 2 capsules (600 mg total) by mouth at bedtime.     hydrochlorothiazide 25 MG tablet  Commonly known as:  HYDRODIURIL  Take 1 tablet (25 mg total) by mouth daily.     insulin NPH-regular Human (70-30) 100 UNIT/ML injection  Commonly known as:  NOVOLIN 70/30  Inject into the skin. 25 qam 20 qpm     multivitamin with minerals Tabs tablet  Take 1 tablet by mouth daily.     naphazoline-glycerin 0.012-0.2 % Soln  Commonly known as:  CLEAR EYES  Place 2 drops into both eyes 2 (two) times daily as needed for irritation.     pioglitazone 30 MG tablet  Commonly known as:  ACTOS  Take 1 tablet (30 mg total) by mouth daily.     pravastatin 10 MG tablet  Commonly known as:  PRAVACHOL  TAKE ONE TABLET BY MOUTH ONCE DAILY AT BEDTIME     triamcinolone cream 0.1 %  Commonly known as:  KENALOG  Apply 1 application topically 3 (three) times daily. To the affected areas     valsartan 320 MG tablet  Commonly known as:  DIOVAN  Take 1 tablet (320 mg total) by mouth daily.     Vitamin D (Ergocalciferol) 50000 UNITS Caps capsule  Commonly known as:  DRISDOL  Take 1 capsule (50,000 Units total) by mouth every 7 (seven) days.        Allergies:  Allergies  Allergen Reactions  . Enalapril Maleate  Cough    Past Medical History  Diagnosis Date  . Type 2 diabetes mellitus   . Essential hypertension, benign   . Prostate cancer 2011  . Mixed hyperlipidemia   . Chronic back pain     Disc disease  . Pancreatic insufficiency     Diagnosed at [facility]  . Diabetes mellitus   . Coronary atherosclerosis of native coronary artery     Coronary calcifications by chest CT, Myoview demonstrating inferolateral scar  . Diastolic dysfunction 11/2593    Grade 1.  . Hyperkalemia   . Kidney stones   . Chronic kidney disease     Past Surgical History  Procedure Laterality Date  . Prostatectomy  2011  . Hernia repair    . Appendectomy    . Cholecystectomy    . Bilateral hip replacement      Family History  Problem Relation Age of Onset  . Heart attack Father   . Diabetes type II Mother     Social History:  reports that he has never smoked. He has never used smokeless tobacco. He reports that he does not drink alcohol or use illicit drugs.    Review of Systems    Lipid history: On 46m Pravastatin    Lab Results  Component Value Date   CHOL 112 12/12/2014   HDL 37* 12/12/2014   LDLCALC 48 12/12/2014   TRIG 136 12/12/2014   CHOLHDL 3.0 12/12/2014           Constitutional: no recent weight gain/loss, no complaints of unusual fatigue   Eyes: no history of blurred vision.  Most recent eye exam was in 1/16, apparently no retinopathy  ENT: no nasal congestion, difficulty swallowing, no hoarseness   Cardiovascular: no chest pain or tightness on exertion.  No leg swelling.  Hypertension: Present, treated with carvedilol, clonidine, valsartan 320 mg and doxazosin.  Previously on HCTZ also  Respiratory: no cough; has mild/shortness of breath on exertion  Gastrointestinal: no constipation, diarrhea, nausea or abdominal pain  Musculoskeletal: no muscle/joint aches.  He has a long history of muscle cramps and he thinks that taking gabapentin helps this Has low back pain for some  time and this limits his activity   Urological:   No frequency of urination or  nocturia  Skin: no rash or infections  Neurological: no headaches.  Has no numbness, burning, pains or tingling in feet.  He is taking gabapentin for his muscle cramps and does not think he has any sharp pains in his feet    Psychiatric: no recent symptoms of depression with taking Celexa, may have anxiety at times  Endocrine: No unusual fatigue, cold intolerance or history of thyroid disease   LABS:  No visits with results within 1 Week(s) from this visit. Latest known visit with results is:  Office Visit on 12/12/2014  Component Date Value Ref Range Status  . Sodium 12/12/2014 137  135 - 145 mEq/L Final  . Potassium 12/12/2014 4.1  3.5 - 5.3 mEq/L Final  . Chloride 12/12/2014 105  96 - 112 mEq/L Final  . CO2 12/12/2014 23  19 - 32 mEq/L Final  . Glucose, Bld 12/12/2014 106* 70 - 99 mg/dL Final  . BUN 12/12/2014 22  6 - 23 mg/dL Final  . Creat 12/12/2014 1.35  0.50 - 1.35 mg/dL Final  . Total Bilirubin 12/12/2014 0.4  0.2 - 1.2 mg/dL Final  . Alkaline Phosphatase 12/12/2014 103  39 - 117 U/L Final  . AST 12/12/2014 28  0 - 37 U/L Final  . ALT 12/12/2014 32  0 - 53 U/L Final  . Total Protein 12/12/2014 6.7  6.0 - 8.3 g/dL Final  . Albumin 12/12/2014 3.8  3.5 - 5.2 g/dL Final  . Calcium 12/12/2014 9.4  8.4 - 10.5 mg/dL Final  . GFR, Est African American 12/12/2014 60   Final  . GFR, Est Non African American 12/12/2014 52*  Final   Comment:   The estimated GFR is a calculation valid for adults (>=74 years old) that uses the CKD-EPI algorithm to adjust for age and sex. It is   not to be used for children, pregnant women, hospitalized patients,    patients on dialysis, or with rapidly changing kidney function. According to the NKDEP, eGFR >89 is normal, 60-89 shows mild impairment, 30-59 shows moderate impairment, 15-29 shows severe impairment and <15 is ESRD.     Marland Kitchen Cholesterol 12/12/2014 112  0  - 200 mg/dL Final   Comment: ATP III Classification:       < 200        mg/dL        Desirable      200 - 239     mg/dL        Borderline High      >= 240        mg/dL        High     . Triglycerides 12/12/2014 136  <150 mg/dL Final  . HDL 12/12/2014 37* >=40 mg/dL Final   ** Please note change in reference range(s). **  . Total CHOL/HDL Ratio 12/12/2014 3.0   Final  . VLDL 12/12/2014 27  0 - 40 mg/dL Final  . LDL Cholesterol 12/12/2014 48  0 - 99 mg/dL Final   Comment:   Total Cholesterol/HDL Ratio:CHD Risk                        Coronary Heart Disease Risk Table                                        Men       Women          1/2 Average Risk              3.4        3.3              Average Risk              5.0        4.4           2X Average Risk              9.6        7.1           3X Average Risk             23.4       11.0 Use the calculated Patient Ratio above and the CHD Risk table  to determine the patient's CHD Risk. ATP III Classification (LDL):       < 100        mg/dL         Optimal      100 - 129     mg/dL         Near or Above Optimal  130 - 159     mg/dL         Borderline High      160 - 189     mg/dL         High       > 190        mg/dL         Very High     . Hgb A1c MFr Bld 12/12/2014 7.8* <5.7 % Final   Comment:                                                                        According to the ADA Clinical Practice Recommendations for 2011, when HbA1c is used as a screening test:     >=6.5%   Diagnostic of Diabetes Mellitus            (if abnormal result is confirmed)   5.7-6.4%   Increased risk of developing Diabetes Mellitus   References:Diagnosis and Classification of Diabetes Mellitus,Diabetes ZSWF,0932,35(TDDUK 1):S62-S69 and Standards of Medical Care in         Diabetes - 2011,Diabetes GURK,2706,23 (Suppl 1):S11-S61.     . Mean Plasma Glucose 12/12/2014 177* <117 mg/dL Final  . Microalb, Ur 12/13/2014 1.0  <2.0 mg/dL Final    Comment: The ADA (Diabetes Care 7628;31(DVVOH 1):S14-S80) has defined abnormalities in albumin excretion as follows:            Category           Result                            (mg/g creatinine)                 Normal:    <30       Microalbuminuria:    30 - 299   Clinical albuminuria:    > or = 300    The ADA recommends that at least two of three specimens collected within a 3 - 6 month period be abnormal before considering a patient to be within a diagnostic category.     Physical Examination:  BP 120/62 mmHg  Pulse 74  Temp(Src) 98.2 F (36.8 C)  Resp 16  Ht _0  (1.753 m)  Wt 223 lb 3.2 oz (101.243 kg)  BMI 32.95 kg/m2  SpO2 94%  GENERAL:         Patient has generalized obesity.   HEENT:         Eye exam shows normal external appearance. Fundus exam shows no retinopathy. Oral exam shows normal mucosa .  NECK:   There is no lymphadenopathy Thyroid is not enlarged and no nodules felt.  Carotids are normal to palpation and no bruit heard LUNGS:         Chest is symmetrical. Lungs are clear to auscultation.Marland Kitchen   HEART:         Heart sounds:  S1 and S2 are normal. No murmurs or clicks heard., no S3 or S4.   ABDOMEN:   There is no distention present. Liver and spleen are not palpable. No other mass or tenderness present.   NEUROLOGICAL:   Vibration sense is  reduced in distal  first toes. Ankle jerks are absent bilaterally.           MUSCULOSKELETAL:  There is no swelling or deformity of the peripheral joints. Spine is normal to inspection.   EXTREMITIES:     There is no edema. No skin lesions present.Marland Kitchen SKIN:       No rash or lesions of concern.        ASSESSMENT:  Diabetes type 2, uncontrolled    He has had persistent hyperglycemia along with difficulty losing weight with his insulin regimen He previously had tried metformin and Actos and has not tried any GLP-1 drugs or Invokana Metformin has been stopped because of his renal insufficiency and is continuing on  Actos Currently with premixed insulin before breakfast and supper his blood sugars are not adequately controlled after meals and most of his readings after evening meal around bedtime are consistently high However he does not check his blood sugars after breakfast or lunch usually, may have some high readings at supper also He also tends to have nocturnal hypoglycemia with his premixed insulin at suppertime  He probably needs to continue insulin especially as he is not willing to consider the cost of using a GLP-1 drug which would help his control and help him lose weight also. Also he may benefit from using a V-go pump which would be more efficient in treating his diabetes as well as more practical for him instead of multiple injection.  Demonstrated how this would work and brochure given  Complications: Probable mild neuropathy  PLAN:   Since he is not willing to use a basal bolus regimen with Lantus and mealtime insulin he will be switched to NPH and regular.  Discussed in detail how NPH works and timing of injection as well as adjustment of the bedtime dose based on fasting blood sugar patterns  He will start taking regular insulin to cover his breakfast and dinner to start with and if needed also at lunch.  He already has some regular insulin at home.  For now will start him on 8 units twice a day.  Discussed that the insulin will be adjusted based on the 2 hour blood sugar reading which he will need to start doing and discussed blood sugar targets of at least under 180 after meals consistently  He will need to improve his diet and will set up appointment with  dietitian also  Chair exercises given to increase his activity level which he is currently not doing  May consider adding metformin also if his creatinine continues to stay below 1.4  Also because of his tendency to weight gain would consider reducing his Actos to 15 mg  He will check in with the cost of the V-go pump and  discussed that he can do a one-week trial of this if he is interested.  Brochure and contact information given  Patient Instructions  Change 70/30 to N and R:  N insulin take 18 units before breakfast and 12 at bedtime  R insulin 8 before Bfst and supper: Try adjusting 1-2 units if sugars are above 200 after meals  Check blood sugars on waking up .Marland Kitchen 3-4 .Marland Kitchen times a week Also check blood sugars about 2-3 hours after a meal and do this after different meals by rotation  Recommended blood sugar levels on waking up is 90-130 and about 2 hours after meal is 140-180 Please bring blood sugar monitor to each visit.   Counseling time on subjects discussed above is over  50% of today's 60 minute visit   Nathanyal Ashmead 01/12/2015, 3:02 PM   Note: This office note was prepared with Estate agent. Any transcriptional errors that result from this process are unintentional.

## 2015-01-12 NOTE — Patient Instructions (Signed)
Change 70/30 to N and R:  N insulin take 18 units before breakfast and 12 at bedtime  R insulin 8 before Bfst and supper: Try adjusting 1-2 units if sugars are above 200 after meals  Check blood sugars on waking up .Marland Kitchen 3-4 .Marland Kitchen times a week Also check blood sugars about 2-3 hours after a meal and do this after different meals by rotation  Recommended blood sugar levels on waking up is 90-130 and about 2 hours after meal is 140-180 Please bring blood sugar monitor to each visit.

## 2015-01-27 ENCOUNTER — Other Ambulatory Visit: Payer: Self-pay | Admitting: Family Medicine

## 2015-01-27 MED ORDER — PRAVASTATIN SODIUM 10 MG PO TABS: ORAL_TABLET | ORAL | Status: DC

## 2015-01-27 MED ORDER — CARVEDILOL 25 MG PO TABS: ORAL_TABLET | ORAL | Status: DC

## 2015-01-27 NOTE — Telephone Encounter (Signed)
Medication refilled per protocol. 

## 2015-01-31 ENCOUNTER — Other Ambulatory Visit: Payer: Self-pay | Admitting: Family Medicine

## 2015-02-10 ENCOUNTER — Ambulatory Visit: Payer: PPO | Admitting: Endocrinology

## 2015-02-27 ENCOUNTER — Encounter: Payer: PPO | Attending: Endocrinology | Admitting: Nutrition

## 2015-02-27 DIAGNOSIS — E1165 Type 2 diabetes mellitus with hyperglycemia: Secondary | ICD-10-CM

## 2015-02-27 NOTE — Progress Notes (Signed)
[name] is here with his wife to review blood sugars and discuss V-go insulin delivery device. He did not bring his meter, but says his FBSs are all less than 140--usually 110, or 115.  He is not testing during the day, but tests at HS--which he says are between 115-130.    Insulin dose:  R: 8u acB and acS.  None ususally acL He says he is not waiting 30 min. after taking the insulin before eating his meals. He does take 1-2 units more if blood sugars are high, or if he is eating a bigger meal.                         NPH: 18AM/ 12uPM:                       He is pleased with his blood sugars, and says he has no problem with waiting 30 min. After taking is R insulin before eating.    He was shown the V-go and information was filled out and he signed the form so that they can review his insurance coverage for this.  I faxed it in, and he will call me if he wants to try this for 6 days.    We reviewed diet and discussed the importance of having a balanced meal and eating lunch every day by 12:30PM.  He reported good understanding of this.  Suggestions were given for protein choices at breakfast.

## 2015-02-27 NOTE — Patient Instructions (Signed)
Test blood sugars before supper 1-2 times/wk, and then 2hours after one meal each day.   Wait 30 min. After taking R insulin before eating your meal.

## 2015-02-28 ENCOUNTER — Ambulatory Visit (INDEPENDENT_AMBULATORY_CARE_PROVIDER_SITE_OTHER): Payer: PPO | Admitting: Family Medicine

## 2015-02-28 ENCOUNTER — Encounter: Payer: Self-pay | Admitting: Family Medicine

## 2015-02-28 VITALS — BP 134/82 | HR 78 | Temp 98.5°F | Resp 20 | Ht 69.0 in | Wt 226.0 lb

## 2015-02-28 DIAGNOSIS — R7989 Other specified abnormal findings of blood chemistry: Secondary | ICD-10-CM

## 2015-02-28 DIAGNOSIS — E291 Testicular hypofunction: Secondary | ICD-10-CM

## 2015-02-28 DIAGNOSIS — I10 Essential (primary) hypertension: Secondary | ICD-10-CM

## 2015-02-28 LAB — COMPLETE METABOLIC PANEL WITH GFR
ALK PHOS: 133 U/L — AB (ref 40–115)
ALT: 65 U/L — ABNORMAL HIGH (ref 9–46)
AST: 34 U/L (ref 10–35)
Albumin: 3.7 g/dL (ref 3.6–5.1)
BUN: 23 mg/dL (ref 7–25)
CO2: 24 mEq/L (ref 20–31)
CREATININE: 1.36 mg/dL — AB (ref 0.70–1.18)
Calcium: 9.1 mg/dL (ref 8.6–10.3)
Chloride: 104 mEq/L (ref 98–110)
GFR, EST AFRICAN AMERICAN: 59 mL/min — AB (ref >=60)
GFR, EST NON AFRICAN AMERICAN: 51 mL/min — AB (ref >=60)
GLUCOSE: 261 mg/dL — AB (ref 70–99)
POTASSIUM: 4.6 meq/L (ref 3.5–5.3)
SODIUM: 138 meq/L (ref 135–146)
Total Bilirubin: 0.5 mg/dL (ref 0.2–1.2)
Total Protein: 6.1 g/dL (ref 6.1–8.1)

## 2015-02-28 NOTE — Progress Notes (Signed)
Subjective:    Patient ID: [name]., male    DOB: 1941-01-22, 74 y.o.   MRN: [id number]  HPI  Patient is here to follow up for his blood pressure.  His BP ranges 101-140/63-75.  He denies any chest pain shortness of breath or dyspnea on exertion.  He does complain of severe fatigue. He reports poor libido. He has no energy. He has a history of prostate cancer as well as coronary artery disease. However he is interested in having his testosterone level checked because in the past his total testosterone level was low at 250. He is also interested in having his vitamin D level checked Past Medical History  Diagnosis Date  . Type 2 diabetes mellitus   . Essential hypertension, benign   . Prostate cancer 2011  . Mixed hyperlipidemia   . Chronic back pain     Disc disease  . Pancreatic insufficiency     Diagnosed at [facility]  . Diabetes mellitus   . Coronary atherosclerosis of native coronary artery     Coronary calcifications by chest CT, Myoview demonstrating inferolateral scar  . Diastolic dysfunction 01/3148    Grade 1.  . Hyperkalemia   . Kidney stones   . Chronic kidney disease    Past Surgical History  Procedure Laterality Date  . Prostatectomy  2011  . Hernia repair    . Appendectomy    . Cholecystectomy    . Bilateral hip replacement     Current Outpatient Prescriptions on File Prior to Visit  Medication Sig Dispense Refill  . albuterol (PROVENTIL HFA;VENTOLIN HFA) 108 (90 BASE) MCG/ACT inhaler Inhale 1-2 puffs into the lungs every 6 (six) hours as needed for wheezing. 1 Inhaler 0  . aspirin EC 81 MG tablet Take 81 mg by mouth at bedtime.     . carvedilol (COREG) 25 MG tablet TAKE ONE TABLET BY MOUTH TWICE DAILY 180 tablet 1  . citalopram (CELEXA) 20 MG tablet TAKE ONE TABLET BY MOUTH ONCE DAILY 90 tablet 3  . cloNIDine (CATAPRES) 0.2 MG tablet TAKE ONE TABLET BY MOUTH TWICE DAILY 60 tablet 11  . diazepam (VALIUM) 5 MG tablet TAKE ONE TABLET BY MOUTH TWICE DAILY  AS NEEDED FOR ANXIETY 60 tablet 0  . doxazosin (CARDURA) 8 MG tablet Take 1 tablet (8 mg total) by mouth daily. 90 tablet 3  . FREESTYLE LITE test strip USE  STRIP TO CHECK BLOOD SUGAR THREE TIMES DAILY 100 each 5  . furosemide (LASIX) 20 MG tablet Take 1 tablet (20 mg total) by mouth daily. 90 tablet 3  . gabapentin (NEURONTIN) 300 MG capsule Take 2 capsules (600 mg total) by mouth at bedtime. 180 capsule 3  . insulin NPH-regular Human (NOVOLIN 70/30) (70-30) 100 UNIT/ML injection Inject into the skin. 25 qam 20 qpm    . Multiple Vitamin (MULTIVITAMIN WITH MINERALS) TABS Take 1 tablet by mouth daily.    . naphazoline-glycerin (CLEAR EYES) 0.012-0.2 % SOLN Place 2 drops into both eyes 2 (two) times daily as needed for irritation.    . pioglitazone (ACTOS) 30 MG tablet Take 1 tablet (30 mg total) by mouth daily. 90 tablet 3  . pravastatin (PRAVACHOL) 10 MG tablet TAKE ONE TABLET BY MOUTH ONCE DAILY AT BEDTIME 90 tablet 1  . triamcinolone cream (KENALOG) 0.1 % Apply 1 application topically 3 (three) times daily. To the affected areas 30 g 0  . valsartan (DIOVAN) 320 MG tablet Take 1 tablet (320 mg total) by mouth  daily. 90 tablet 3  . ZENPEP 10000 UNITS CPEP TAKE 1 CAPSULE BY MOUTH THREE TIMES A DAY BEFORE MEALS 90 capsule 2  . Vitamin D, Ergocalciferol, (DRISDOL) 50000 UNITS CAPS capsule Take 1 capsule (50,000 Units total) by mouth every 7 (seven) days. (Patient not taking: Reported on 01/12/2015) 4 capsule 6   No current facility-administered medications on file prior to visit.   Allergies  Allergen Reactions  . Enalapril Maleate Cough   History   Social History  . Marital Status: Married    Spouse Name: N/A  . Number of Children: N/A  . Years of Education: N/A   Occupational History  . Not on file.   Social History Main Topics  . Smoking status: Never Smoker   . Smokeless tobacco: Never Used  . Alcohol Use: No  . Drug Use: No  . Sexual Activity: Not on file   Other Topics  Concern  . Not on file   Social History Narrative     Review of Systems  All other systems reviewed and are negative.      Objective:   Physical Exam  Cardiovascular: Normal rate, regular rhythm and normal heart sounds.   No murmur heard. Pulmonary/Chest: Effort normal and breath sounds normal. No respiratory distress. He has no wheezes. He has no rales.  Abdominal: Soft. Bowel sounds are normal.  Vitals reviewed.         Assessment & Plan:  Hypogonadism in male - Plan: Testosterone, Free, Total, SHBG, Follicle Stimulating Hormone, Luteinizing hormone, PSA, Medicare, COMPLETE METABOLIC PANEL WITH GFR  Low serum vitamin D - Plan: Vitamin D, 25-hydroxy  Benign essential HTN  Blood pressure is excellent. I had a long discussion today with the patient regarding the risk of testosterone replacement given his history of prostate cancer. The patient would like the level checked and then have the results sent to his urologist. I will also check an [place] and LH. Also discussed the risk of coronary artery disease on testosterone replacement. I will also check a vitamin D level at the patient's request however I recommended he take 1000 units of vitamin D every day regardless.

## 2015-03-01 LAB — LUTEINIZING HORMONE: LH: 6.5 m[IU]/mL (ref 3.1–34.6)

## 2015-03-01 LAB — TESTOSTERONE, FREE, TOTAL, SHBG
Sex Hormone Binding: 41 nmol/L (ref 22–77)
TESTOSTERONE: 255 ng/dL — AB (ref 300–890)
Testosterone, Free: 43.1 pg/mL — ABNORMAL LOW (ref 47.0–244.0)
Testosterone-% Free: 1.7 % (ref 1.6–2.9)

## 2015-03-01 LAB — FOLLICLE STIMULATING HORMONE: FSH: 4.6 m[IU]/mL (ref 1.4–18.1)

## 2015-03-01 LAB — VITAMIN D 25 HYDROXY (VIT D DEFICIENCY, FRACTURES): Vit D, 25-Hydroxy: 12 ng/mL — ABNORMAL LOW (ref 30–100)

## 2015-03-01 LAB — PSA, MEDICARE: PSA: <0.01 ng/mL (ref <=4.00)

## 2015-03-06 ENCOUNTER — Encounter: Payer: Self-pay | Admitting: Family Medicine

## 2015-03-06 ENCOUNTER — Other Ambulatory Visit: Payer: Self-pay | Admitting: Family Medicine

## 2015-03-06 ENCOUNTER — Encounter: Payer: Self-pay | Admitting: Endocrinology

## 2015-03-06 ENCOUNTER — Ambulatory Visit (INDEPENDENT_AMBULATORY_CARE_PROVIDER_SITE_OTHER): Payer: PPO | Admitting: Endocrinology

## 2015-03-06 VITALS — BP 116/68 | HR 67 | Temp 98.0°F | Resp 16 | Ht 69.0 in | Wt 227.0 lb

## 2015-03-06 DIAGNOSIS — E1165 Type 2 diabetes mellitus with hyperglycemia: Secondary | ICD-10-CM

## 2015-03-06 DIAGNOSIS — E291 Testicular hypofunction: Secondary | ICD-10-CM

## 2015-03-06 DIAGNOSIS — IMO0002 Reserved for concepts with insufficient information to code with codable children: Secondary | ICD-10-CM

## 2015-03-06 DIAGNOSIS — I10 Essential (primary) hypertension: Secondary | ICD-10-CM | POA: Diagnosis not present

## 2015-03-06 LAB — POCT GLYCOSYLATED HEMOGLOBIN (HGB A1C): Hemoglobin A1C: 8

## 2015-03-06 MED ORDER — VITAMIN D (ERGOCALCIFEROL) 1.25 MG (50000 UNIT) PO CAPS: 50000.0000 [IU] | ORAL_CAPSULE | ORAL | Status: DC

## 2015-03-06 NOTE — Progress Notes (Signed)
Patient ID: Mark Hose., male   DOB: 11-16-1940, 74 y.o.   MRN: [id number]           Reason for Appointment: Follow-up for Type 2 Diabetes  Referring physician: Pickard  History of Present Illness:          Date of diagnosis of type 2 diabetes mellitus :        Background history:  The first few years of his diabetes he was treated with Metformin At some point he was also given Actos by his PCP for control However because of worsening control he thinks he was started on insulin probably Lantus in 2005  Recent history:  His Lantus insulin was changed to 70/30 a couple of years ago because of his difficulty affording the Lantus However with this his blood sugars have not been optimally controlled and A1c has been persistently high  His A1c was around 9% in 6/15and at that time he was started back on Actos, initially 15 mg and then 30 mg in September With this his blood sugars improved somewhat He was referred here because of persistently high A1c of 7.8 in 5/16 He was taking 45 units a day of premixed insulin in 2 divided doses  INSULIN regimen is described as:     N insulin take 18 units before breakfast and 12 at bedtime.  NOVOLIN R insulin 8 before Bfst and supper  He has been able to take his new insulin regimen with NPH and regular as directed since his initial consultation Prior to his visit he was having fluctuating blood sugars with readings over 200 at all times but also some normal readings Difficult to analyze his blood sugars as he has incorrect time programmed on his monitor and he has poor memory on when he is checking his blood sugars  Current blood sugar patterns and problems identified:  His fasting blood sugars more recently have been relatively stable and near normal  Has not had any nocturnal hypoglycemia  Not clear if he is checking his blood sugars before supper as the monitor indicates he is checking his blood sugars around 10 PM  Blood sugars  after supper seem to fluctuate especially about a month ago but more recently have been relatively stable  He has had a couple of readings in the 50s after supper possibly from not eating as much in the evening  With eating out at restaurants he tends to have blood sugars over 200 but does not adjust his dose based on what he is eating  He is fairly compliant with taking his insulin before meals but does not wait 30 minutes before eating     Oral hypoglycemic drugs the patient is taking are: Actos 30 mg daily      Side effects from medications have been: Some swelling from Actos treated with Lasix  Compliance with the medical regimen: Fair, he thinks he can do better on diet Hypoglycemia: None recently, previously was having some nocturnal hypoglycemia    Glucose monitoring:  done  times a day         Glucometer: Lamar Sprinkles    Blood Glucose readings by time of day and averages from download recently  Mean values apply above for all meters except median for One Touch  PRE-MEAL Fasting Lunch Dinner Bedtime Overall  Glucose range:  77-167     56-297    Mean/median:  124     152  140     Self-care: The diet  that the patient has been following is: tries to limit sweets.     Meal times: Breakfast: 9 AM.  Lunch: Variable, Dinner: 5 pm. Eating out 3/7 days a week in the evenings  Typical meal intake: Breakfast is Eggs, bacon and egg toast.  Lunch is a sandwich sometimes.  Dinner is meat and vegetables, has peanut butter snacks periodically at lunch or bedtime  Dietician visit, most recent: 15 yrs               Exercise:  none, has back pain  Weight history: 213 in 15  Wt Readings from Last 3 Encounters:  03/06/15 227 lb (102.967 kg)  02/28/15 226 lb (102.513 kg)  01/12/15 223 lb 3.2 oz (101.243 kg)    Glycemic control:   Lab Results  Component Value Date   HGBA1C 7.8* 12/12/2014   HGBA1C 8.5* 08/02/2014   HGBA1C 8.3* 04/21/2014   Lab Results  Component Value Date   MICROALBUR  1.0 12/13/2014   LDLCALC 48 12/12/2014   CREATININE 1.36* 02/28/2015         Medication List       This list is accurate as of: 03/06/15  2:22 PM.  Always use your most recent med list.               albuterol 108 (90 BASE) MCG/ACT inhaler  Commonly known as:  PROVENTIL HFA;VENTOLIN HFA  Inhale 1-2 puffs into the lungs every 6 (six) hours as needed for wheezing.     aspirin EC 81 MG tablet  Take 81 mg by mouth at bedtime.     carvedilol 25 MG tablet  Commonly known as:  COREG  TAKE ONE TABLET BY MOUTH TWICE DAILY     citalopram 20 MG tablet  Commonly known as:  CELEXA  TAKE ONE TABLET BY MOUTH ONCE DAILY     cloNIDine 0.2 MG tablet  Commonly known as:  CATAPRES  TAKE ONE TABLET BY MOUTH TWICE DAILY     diazepam 5 MG tablet  Commonly known as:  VALIUM  TAKE ONE TABLET BY MOUTH TWICE DAILY AS NEEDED FOR ANXIETY     doxazosin 8 MG tablet  Commonly known as:  CARDURA  Take 1 tablet (8 mg total) by mouth daily.     FREESTYLE LITE test strip  Generic drug:  glucose blood  USE  STRIP TO CHECK BLOOD SUGAR THREE TIMES DAILY     furosemide 20 MG tablet  Commonly known as:  LASIX  Take 1 tablet (20 mg total) by mouth daily.     gabapentin 300 MG capsule  Commonly known as:  NEURONTIN  Take 2 capsules (600 mg total) by mouth at bedtime.     insulin NPH-regular Human (70-30) 100 UNIT/ML injection  Commonly known as:  NOVOLIN 70/30  Inject into the skin. 25 qam 20 qpm     multivitamin with minerals Tabs tablet  Take 1 tablet by mouth daily.     naphazoline-glycerin 0.012-0.2 % Soln  Commonly known as:  CLEAR EYES  Place 2 drops into both eyes 2 (two) times daily as needed for irritation.     pioglitazone 30 MG tablet  Commonly known as:  ACTOS  Take 1 tablet (30 mg total) by mouth daily.     pravastatin 10 MG tablet  Commonly known as:  PRAVACHOL  TAKE ONE TABLET BY MOUTH ONCE DAILY AT BEDTIME     triamcinolone cream 0.1 %  Commonly known as:  KENALOG  Apply 1 application topically 3 (three) times daily. To the affected areas     valsartan 320 MG tablet  Commonly known as:  DIOVAN  Take 1 tablet (320 mg total) by mouth daily.     Vitamin D (Ergocalciferol) 50000 UNITS Caps capsule  Commonly known as:  DRISDOL  Take 1 capsule (50,000 Units total) by mouth every 7 (seven) days.     Vitamin D (Ergocalciferol) 50000 UNITS Caps capsule  Commonly known as:  DRISDOL  Take 1 capsule (50,000 Units total) by mouth every 7 (seven) days.     ZENPEP 10000 UNITS Cpep  Generic drug:  Pancrelipase (Lip-Prot-Amyl)  TAKE 1 CAPSULE BY MOUTH THREE TIMES A DAY BEFORE MEALS        Allergies:  Allergies  Allergen Reactions  . Enalapril Maleate Cough    Past Medical History  Diagnosis Date  . Type 2 diabetes mellitus   . Essential hypertension, benign   . Prostate cancer 2011  . Mixed hyperlipidemia   . Chronic back pain     Disc disease  . Pancreatic insufficiency     Diagnosed at [facility]  . Diabetes mellitus   . Coronary atherosclerosis of native coronary artery     Coronary calcifications by chest CT, Myoview demonstrating inferolateral scar  . Diastolic dysfunction 08/6107    Grade 1.  . Hyperkalemia   . Kidney stones   . Chronic kidney disease     Past Surgical History  Procedure Laterality Date  . Prostatectomy  2011  . Hernia repair    . Appendectomy    . Cholecystectomy    . Bilateral hip replacement      Family History  Problem Relation Age of Onset  . Heart attack Father   . Diabetes type II Mother     Social History:  reports that he has never smoked. He has never used smokeless tobacco. He reports that he does not drink alcohol or use illicit drugs.    Review of Systems   He complains of easy fatigability   Lipid history: On 71m Pravastatin    Lab Results  Component Value Date   CHOL 112 12/12/2014   HDL 37* 12/12/2014   LDLCALC 48 12/12/2014   TRIG 136 12/12/2014   CHOLHDL 3.0 12/12/2014             Most recent eye exam was in 1/16, apparently no retinopathy   Hypertension: Present, treated with carvedilol, clonidine, valsartan 320 mg and doxazosin.  Previously on HCTZ also    Has no numbness, burning, pains or tingling in feet.  He is taking gabapentin for his muscle cramps and does not think he has any sharp pains in his feet    Endocrine: Hypogonadism has been present for at least 3 years but it made has been deferred by urologist because of his history of prostate cancer He is being reevaluated by urologist  Lab Results  Component Value Date   TESTOSTERONE 255* 02/28/2015      Physical Examination:  BP 116/68 mmHg  Pulse 67  Temp(Src) 98 F (36.7 C)  Resp 16  Ht 5' 9"  (1.753 m)  Wt 227 lb (102.967 kg)  BMI 33.51 kg/m2  SpO2 93%    ASSESSMENT:  Diabetes type 2, uncontrolled    He has had persistent hyperglycemia along with difficulty losing weight with previous regimen of premixed insulin He previously had tried metformin and Actos and has not tried any GLP-1 drugs or Invokana  With taking NPH  and regular instead of premixed insulin his blood sugars are somewhat less fluctuating He is fairly compliant with his new regimen However difficult to know when he is checking his blood sugars as the glucose monitor as the incorrect time and most likely he is checking his blood sugars somewhere in the morning hours and some at bedtime He does have occasional low sugars after supper if he does not eat as much and with eating out he will have high readings  HYPOGONADISM: Discussed with patient that he has relatively mild hypogonadism as judged by his slightly low free testosterone.  He can do a short-term trial of treatment if agreeable with urologist.  Again cost of the medication will be a factor   PLAN:   Reduce bedtime NPH by 2 units  Adjust suppertime dose between 6-10 units based on how much he is eating and take 8 units for average meals   He will need to  improve his diet and will set up appointment with  dietitian also  Chair exercises given to increase his activity level which he is currently not doing  May consider adding metformin also if his creatinineimproves   Also because of his tendency to weight gain would consider reducing his Actos to 15 mg  Again will  check in with the cost of the V-go pump and discussed that he can do a one-week trial of this if he is interested.    He was given a new FreeStyle monitor today and the date was programmed  Patient Instructions  Reduce Bedtime N insulin to 10 units  May take 10 Regular at supper if eating out; if eating smaller meals or less carbs then use 6 units  Check blood sugars on waking up ..3  .. times a week Also check blood sugars about 2 hours after a meal and do this after different meals by rotation  Recommended blood sugar levels on waking up is 90-130 and about 2 hours after meal is 140-180 Please bring blood sugar monitor to each visit.    Counseling time on subjects discussed above is over 50% of today's 25 minute visit   Allsion Nogales 03/06/2015, 2:22 PM   Note: This office note was prepared with Estate agent. Any transcriptional errors that result from this process are unintentional.  Addendum: A1c report came back as 8%, needs improvement

## 2015-03-06 NOTE — Patient Instructions (Signed)
Reduce Bedtime N insulin to 10 units  May take 10 Regular at supper if eating out; if eating smaller meals or less carbs then use 6 units  Check blood sugars on waking up ..3  .. times a week Also check blood sugars about 2 hours after a meal and do this after different meals by rotation  Recommended blood sugar levels on waking up is 90-130 and about 2 hours after meal is 140-180 Please bring blood sugar monitor to each visit.

## 2015-03-14 ENCOUNTER — Encounter: Payer: Self-pay | Admitting: Family Medicine

## 2015-03-14 DIAGNOSIS — R799 Abnormal finding of blood chemistry, unspecified: Secondary | ICD-10-CM

## 2015-04-03 ENCOUNTER — Other Ambulatory Visit: Payer: Self-pay | Admitting: Family Medicine

## 2015-04-04 NOTE — Telephone Encounter (Signed)
ok 

## 2015-04-04 NOTE — Telephone Encounter (Signed)
Medication called to pharmacy. 

## 2015-04-04 NOTE — Telephone Encounter (Signed)
Ok to refill??  Last office visit 03/06/2015.  Last refill 12/27/2014.

## 2015-04-05 ENCOUNTER — Other Ambulatory Visit: Payer: Self-pay | Admitting: Family Medicine

## 2015-04-05 NOTE — Telephone Encounter (Signed)
Pharm called #[phone] + 0 refills

## 2015-04-07 ENCOUNTER — Encounter (HOSPITAL_COMMUNITY): Payer: Self-pay | Admitting: *Deleted

## 2015-04-07 ENCOUNTER — Emergency Department (HOSPITAL_COMMUNITY): Payer: PPO

## 2015-04-07 ENCOUNTER — Emergency Department (HOSPITAL_COMMUNITY)
Admission: EM | Admit: 2015-04-07 | Discharge: 2015-04-07 | Disposition: A | Discharge status: Home / Self Care | Payer: PPO | Attending: Emergency Medicine | Admitting: Emergency Medicine

## 2015-04-07 DIAGNOSIS — Z7952 Long term (current) use of systemic steroids: Secondary | ICD-10-CM | POA: Diagnosis not present

## 2015-04-07 DIAGNOSIS — Z8719 Personal history of other diseases of the digestive system: Secondary | ICD-10-CM | POA: Insufficient documentation

## 2015-04-07 DIAGNOSIS — Z7982 Long term (current) use of aspirin: Secondary | ICD-10-CM | POA: Insufficient documentation

## 2015-04-07 DIAGNOSIS — S0232XA Fracture of orbital floor, left side, initial encounter for closed fracture: Secondary | ICD-10-CM

## 2015-04-07 DIAGNOSIS — I129 Hypertensive chronic kidney disease with stage 1 through stage 4 chronic kidney disease, or unspecified chronic kidney disease: Secondary | ICD-10-CM | POA: Insufficient documentation

## 2015-04-07 DIAGNOSIS — Z794 Long term (current) use of insulin: Secondary | ICD-10-CM | POA: Insufficient documentation

## 2015-04-07 DIAGNOSIS — G8929 Other chronic pain: Secondary | ICD-10-CM | POA: Insufficient documentation

## 2015-04-07 DIAGNOSIS — S63501A Unspecified sprain of right wrist, initial encounter: Secondary | ICD-10-CM

## 2015-04-07 DIAGNOSIS — W01198A Fall on same level from slipping, tripping and stumbling with subsequent striking against other object, initial encounter: Secondary | ICD-10-CM | POA: Insufficient documentation

## 2015-04-07 DIAGNOSIS — E119 Type 2 diabetes mellitus without complications: Secondary | ICD-10-CM | POA: Diagnosis not present

## 2015-04-07 DIAGNOSIS — Z8546 Personal history of malignant neoplasm of prostate: Secondary | ICD-10-CM | POA: Insufficient documentation

## 2015-04-07 DIAGNOSIS — I2510 Atherosclerotic heart disease of native coronary artery without angina pectoris: Secondary | ICD-10-CM | POA: Diagnosis not present

## 2015-04-07 DIAGNOSIS — Z79899 Other long term (current) drug therapy: Secondary | ICD-10-CM | POA: Insufficient documentation

## 2015-04-07 DIAGNOSIS — Y9389 Activity, other specified: Secondary | ICD-10-CM | POA: Insufficient documentation

## 2015-04-07 DIAGNOSIS — Z87442 Personal history of urinary calculi: Secondary | ICD-10-CM | POA: Insufficient documentation

## 2015-04-07 DIAGNOSIS — S023XXA Fracture of orbital floor, initial encounter for closed fracture: Secondary | ICD-10-CM | POA: Diagnosis not present

## 2015-04-07 DIAGNOSIS — S0012XA Contusion of left eyelid and periocular area, initial encounter: Secondary | ICD-10-CM | POA: Diagnosis present

## 2015-04-07 DIAGNOSIS — N189 Chronic kidney disease, unspecified: Secondary | ICD-10-CM | POA: Diagnosis not present

## 2015-04-07 DIAGNOSIS — Y92009 Unspecified place in unspecified non-institutional (private) residence as the place of occurrence of the external cause: Secondary | ICD-10-CM

## 2015-04-07 DIAGNOSIS — W19XXXA Unspecified fall, initial encounter: Secondary | ICD-10-CM

## 2015-04-07 DIAGNOSIS — E782 Mixed hyperlipidemia: Secondary | ICD-10-CM | POA: Insufficient documentation

## 2015-04-07 DIAGNOSIS — S6391XA Sprain of unspecified part of right wrist and hand, initial encounter: Secondary | ICD-10-CM | POA: Diagnosis not present

## 2015-04-07 DIAGNOSIS — Y998 Other external cause status: Secondary | ICD-10-CM | POA: Insufficient documentation

## 2015-04-07 DIAGNOSIS — Y92099 Unspecified place in other non-institutional residence as the place of occurrence of the external cause: Secondary | ICD-10-CM | POA: Insufficient documentation

## 2015-04-07 HISTORY — DX: Traumatic subdural hemorrhage with loss of consciousness of unspecified duration, initial encounter: S06.5X9A

## 2015-04-07 HISTORY — DX: Traumatic subdural hemorrhage with loss of consciousness status unknown, initial encounter: S06.5XAA

## 2015-04-07 LAB — CBG MONITORING, ED: Glucose-Capillary: 163 mg/dL — ABNORMAL HIGH (ref 65–99)

## 2015-04-07 MED ORDER — AMOXICILLIN-POT CLAVULANATE 875-125 MG PO TABS
1.0000 | ORAL_TABLET | Freq: Once | ORAL | Status: AC
  Administered 2015-04-07: 1 via ORAL
  Filled 2015-04-07: qty 1

## 2015-04-07 MED ORDER — TRAMADOL HCL 50 MG PO TABS: 50.0000 mg | ORAL_TABLET | Freq: Two times a day (BID) | ORAL | Status: DC | PRN

## 2015-04-07 MED ORDER — OXYCODONE-ACETAMINOPHEN 5-325 MG PO TABS: 1.0000 | ORAL_TABLET | ORAL | Status: DC | PRN

## 2015-04-07 MED ORDER — DIAZEPAM 2 MG PO TABS
2.0000 mg | ORAL_TABLET | Freq: Once | ORAL | Status: AC
  Administered 2015-04-07: 2 mg via ORAL
  Filled 2015-04-07: qty 1

## 2015-04-07 MED ORDER — HYDROCODONE-ACETAMINOPHEN 5-325 MG PO TABS
1.0000 | ORAL_TABLET | Freq: Once | ORAL | Status: AC
  Administered 2015-04-07: 1 via ORAL
  Filled 2015-04-07: qty 1

## 2015-04-07 MED ORDER — AMOXICILLIN-POT CLAVULANATE 875-125 MG PO TABS
1.0000 | ORAL_TABLET | Freq: Two times a day (BID) | ORAL | Status: AC
Start: 2015-04-07 — End: 2015-04-14

## 2015-04-07 NOTE — ED Notes (Signed)
Pt states tripped over his dog, fell & hit left eye on the arm of a recliner. Pt denies any vision problems, pt is able to read clock on wall across the room. Pt states right wrist fells that he may have sprained.

## 2015-04-07 NOTE — ED Notes (Signed)
Pt states that he tripped over his dog this am falling and hitting his left eye against the arm of the chair, pt has bruising and swelling noted to left eye area,

## 2015-04-07 NOTE — ED Notes (Signed)
Declined splint

## 2015-04-07 NOTE — ED Provider Notes (Signed)
Ct face shows orbital floor fracture with mild proptosis. Reexamination, patient with blurry vision but EOM intact, no diplopia. Significant swelling present. Will attempt to contact his ophthalmologist and if not able, will d/w oncall ophthalmology.   D/W Dr. Iona Hansen. Patient needs abx, nose blowing precautions and follow up with ophthalmology (which he has today). Was not able to contact ophthalmologist so a voicemail was left. Will print out results and CT scan so patient can take to appointment today.   Re-Exam patient is now complaining of having some lower back pain is intermittent in nature. Sharp worse with movement. Some tenderness to palpation right paraspinal muscles. Concern for likely muscular skeletal strain. We'll treat appropriately.  Merrily Pew, MD 04/07/15 [phone]

## 2015-04-07 NOTE — ED Provider Notes (Signed)
CSN: [id number]     Arrival date & time 04/07/15  1740 History   First MD Initiated Contact with Patient 04/07/15 [phone]     Chief Complaint  Patient presents with  . Fall     (Consider location/radiation/quality/duration/timing/severity/associated sxs/prior Treatment) HPI  Patient states about 5 AM he was walking through his house and didn't realize his dog was on the floor and he stepped on her and he tripped and fell. He states he hit his head on a padded recliner. He did not have loss of consciousness. He has no change in his chronic low back ache pain. He had double vision initially but that is resolved. He denies any numbness or weakness in his extremities, he denies nausea, vomiting, neck pain. He states he has some soreness in his right wrist. He blew his nose at home and had some blood come out of his nostril. He is only on aspirin 81 mg a day as a blood thinner. He reports in 2008 after a fall he did have a subdural hematoma that was watched medically and not treated surgically.  PCP Dr Dennard Schaumann Orthopedist in [place]  Past Medical History  Diagnosis Date  . Type 2 diabetes mellitus   . Essential hypertension, benign   . Prostate cancer 2011  . Mixed hyperlipidemia   . Chronic back pain     Disc disease  . Pancreatic insufficiency     Diagnosed at [facility]  . Diabetes mellitus   . Coronary atherosclerosis of native coronary artery     Coronary calcifications by chest CT, Myoview demonstrating inferolateral scar  . Diastolic dysfunction 03/1855    Grade 1.  . Hyperkalemia   . Kidney stones   . Chronic kidney disease   . Subdural hematoma    Past Surgical History  Procedure Laterality Date  . Prostatectomy  2011  . Hernia repair    . Appendectomy    . Cholecystectomy    . Bilateral hip replacement     Family History  Problem Relation Age of Onset  . Heart attack Father   . Diabetes type II Mother    Social History  Substance Use Topics  . Smoking status: Never  Smoker   . Smokeless tobacco: Never Used  . Alcohol Use: No  lives at home Lives with spouse  Review of Systems  All other systems reviewed and are negative.     Allergies  Enalapril maleate  Home Medications   Prior to Admission medications   Medication Sig Start Date End Date Taking? Authorizing Provider  albuterol (PROVENTIL HFA;VENTOLIN HFA) 108 (90 BASE) MCG/ACT inhaler Inhale 1-2 puffs into the lungs every 6 (six) hours as needed for wheezing. Patient not taking: Reported on 03/06/2015 07/30/12   Milton Ferguson, MD  aspirin EC 81 MG tablet Take 81 mg by mouth at bedtime.     Historical Provider, MD  carvedilol (COREG) 25 MG tablet TAKE ONE TABLET BY MOUTH TWICE DAILY 01/27/15   [name], MD  citalopram (CELEXA) 20 MG tablet TAKE ONE TABLET BY MOUTH ONCE DAILY 08/24/14   [name], MD  cloNIDine (CATAPRES) 0.2 MG tablet TAKE ONE TABLET BY MOUTH TWICE DAILY 09/13/14   [name], MD  diazepam (VALIUM) 5 MG tablet TAKE ONE TABLET BY MOUTH TWICE DAILY AS NEEDED 04/04/15   [name], MD  doxazosin (CARDURA) 8 MG tablet Take 1 tablet (8 mg total) by mouth daily. 04/21/14   [name], MD  FREESTYLE  LITE test strip USE  STRIP TO CHECK BLOOD SUGAR THREE TIMES DAILY 10/24/14   [name], MD  furosemide (LASIX) 20 MG tablet Take 1 tablet (20 mg total) by mouth daily. 12/12/14   [name], MD  gabapentin (NEURONTIN) 300 MG capsule Take 2 capsules (600 mg total) by mouth at bedtime. 12/12/14   [name], MD  insulin NPH-regular Human (NOVOLIN 70/30) (70-30) 100 UNIT/ML injection Inject into the skin. 25 qam 20 qpm    Historical Provider, MD  Multiple Vitamin (MULTIVITAMIN WITH MINERALS) TABS Take 1 tablet by mouth daily.    Historical Provider, MD  naphazoline-glycerin (CLEAR EYES) 0.012-0.2 % SOLN Place 2 drops into both eyes 2 (two) times daily as needed for irritation.    Historical Provider, MD  pioglitazone (ACTOS) 30 MG tablet Take 1 tablet  (30 mg total) by mouth daily. 04/23/14   [name], MD  pravastatin (PRAVACHOL) 10 MG tablet TAKE ONE TABLET BY MOUTH ONCE DAILY AT BEDTIME 01/27/15   [name], MD  triamcinolone cream (KENALOG) 0.1 % Apply 1 application topically 3 (three) times daily. To the affected areas 03/17/14   Tammy Triplett, PA-C  valsartan (DIOVAN) 320 MG tablet Take 1 tablet (320 mg total) by mouth daily. 12/12/14   [name], MD  Vitamin D, Ergocalciferol, (DRISDOL) 50000 UNITS CAPS capsule Take 1 capsule (50,000 Units total) by mouth every 7 (seven) days. 09/01/13   [name], MD  Vitamin D, Ergocalciferol, (DRISDOL) 50000 UNITS CAPS capsule Take 1 capsule (50,000 Units total) by mouth every 7 (seven) days. 03/06/15   [name], MD  ZENPEP 10000 UNITS CPEP TAKE 1 CAPSULE BY MOUTH THREE TIMES A DAY BEFORE MEALS 11/03/14   [name], MD   BP 156/87 mmHg  Pulse 74  Temp(Src) 97.9 F (36.6 C) (Oral)  Resp 16  Ht 5' 8"  (1.727 m)  Wt 235 lb 4 oz (106.709 kg)  BMI 35.78 kg/m2  SpO2 95%  Vital signs normal   Physical Exam  Constitutional: He is oriented to person, place, and time. He appears well-developed and well-nourished.  Non-toxic appearance. He does not appear ill. No distress.  HENT:  Head: Normocephalic.  Right Ear: External ear normal.  Left Ear: External ear normal.  Nose: Nose normal. No mucosal edema or rhinorrhea.  Mouth/Throat: Oropharynx is clear and moist and mucous membranes are normal. No dental abscesses or uvula swelling.  Patient has bruising and swelling of his left eyelids. His superior orbital rims are nontender. He has some mild discomfort of his lower inferior orbital rim medially on the left. He also has some mild tenderness in the left cheek area. His nasal spine is nontender to palpation. He's noted to have some dried blood in his mustache underneath his nose. No septal hematoma seen.  Eyes: Conjunctivae and EOM are normal. Pupils are equal, round, and  reactive to light.  No obvious injury to the globe of the left eye. There is no bruising. There is no evidence of a laceration. Patient states he can read a clock on the wall with his left eye.  Neck: Normal range of motion and full passive range of motion without pain. Neck supple.  Patient raises head freely during the course of conversation without pain.  Cardiovascular: Normal rate, regular rhythm and normal heart sounds.  Exam reveals no gallop and no friction rub.   No murmur heard. Pulmonary/Chest: Effort normal and breath sounds normal. No respiratory  distress. He has no wheezes. He has no rhonchi. He has no rales. He exhibits no tenderness and no crepitus.  Abdominal: Soft. Normal appearance and bowel sounds are normal. He exhibits no distension. There is no tenderness. There is no rebound and no guarding.  Musculoskeletal: Normal range of motion. He exhibits no edema or tenderness.  Moves all extremities well. On exam of his right wrist there is no deformity. He has no pain on dorsiflexion but has some discomfort on supination. His wrist is nontender to palpation.  Neurological: He is alert and oriented to person, place, and time. He has normal strength. No cranial nerve deficit.  Skin: Skin is warm, dry and intact. No rash noted. No erythema. No pallor.  Psychiatric: He has a normal mood and affect. His speech is normal and behavior is normal. His mood appears not anxious.  Nursing note and vitals reviewed.      ED Course  Procedures (including critical care time)  CT of the head and maxillofacial bones was ordered to evaluate his injuries from his fall.  I discussed we would x-ray his wrist and even if the x-rays were normal we would splint his wrist. We discussed occult wrist fractures and need for follow-up in 7-10 days to look for a healing fracture not seen on initial x-ray. He states his orthopedist is at [facility]. Patient also has a appointment at 10:00 this morning with his  ophthalmologist.  Pt left at change of shift with Dr Dolly Rias to get results of radiology studies  Labs Review Labs Reviewed - No data to display  Imaging Review No results found. I have personally reviewed and evaluated these images and lab results as part of my medical decision-making.   EKG Interpretation None      MDM   Final diagnoses:  Fall at home, initial encounter  Contusion of eyelids, left, initial encounter  Wrist sprain, right, initial encounter   Disposition pending  Rolland Porter, MD, Barbette Or, MD 04/09/15 2257

## 2015-04-19 ENCOUNTER — Ambulatory Visit: Payer: PPO | Admitting: Endocrinology

## 2015-04-26 ENCOUNTER — Ambulatory Visit (INDEPENDENT_AMBULATORY_CARE_PROVIDER_SITE_OTHER): Payer: PPO | Admitting: Endocrinology

## 2015-04-26 ENCOUNTER — Encounter: Payer: Self-pay | Admitting: Endocrinology

## 2015-04-26 VITALS — BP 110/68 | HR 63 | Temp 98.2°F | Resp 16 | Ht 69.0 in | Wt 227.4 lb

## 2015-04-26 DIAGNOSIS — I10 Essential (primary) hypertension: Secondary | ICD-10-CM | POA: Diagnosis not present

## 2015-04-26 DIAGNOSIS — E1165 Type 2 diabetes mellitus with hyperglycemia: Secondary | ICD-10-CM

## 2015-04-26 DIAGNOSIS — IMO0002 Reserved for concepts with insufficient information to code with codable children: Secondary | ICD-10-CM

## 2015-04-26 NOTE — Progress Notes (Signed)
Patient ID: [name]., male   DOB: October 06, 1940, 74 y.o.   MRN: [id number]           Reason for Appointment: Follow-up for Type 2 Diabetes  Referring physician: Pickard  History of Present Illness:          Date of diagnosis of type 2 diabetes mellitus :        Background history:  The first few years of his diabetes he was treated with Metformin At some point he was also given Actos by his PCP for control However because of worsening control he thinks he was started on insulin probably Lantus in 2005  Recent history:  His Lantus insulin was changed to 70/30 a couple of years ago because of his difficulty affording the Lantus However with this his blood sugars have not been optimally controlled and A1c has been persistently high  His A1c was around 9% in 6/15and at that time he was started back on Actos, initially 15 mg and then 30 mg in September With this his blood sugars improved somewhat He was referred here because of persistently high A1c of 7.8 in 5/16 He was taking 45 units a day of premixed insulin in 2 divided doses and was changed to NPH and regular insulin for better control  INSULIN regimen is described as:    N insulin 18 units before breakfast and 10 at bedtime.  NOVOLIN R insulin 8 before Bfst and 8-12 supper  With NPH and Regular Insulin in his blood sugars have not been fluctuating as much as before A1c was still high at 8% last month He was told to reduce his bedtime insulin on the last visit because of low normal fasting readings and he thinks he has done this.  He is not able to give consistent history, possibly also has difficulty hearing  Current blood sugar patterns and problems identified:  His fasting blood sugars have been fairly good with only a couple of sporadic low sugars  He has not checked many readings after breakfast or in the afternoon but has a couple of low readings at about 4-5 PM  He is checking his sugar 4-5 hours after his  evening meal and these are extremely variable, probably because of variable diet  Sometimes he will drink regular soft drinks and this may raise his sugar  Although he is trying to adjust his evening insulin based on what he is eating he is not getting consistently good reading  Occasionally will forget his mealtime insulin when eating out  Has only a couple of normal readings at bedtime  With eating out at restaurants he tends to have blood sugars over 200   Oral hypoglycemic drugs the patient is taking are: Actos 30 mg daily      Side effects from medications have been: Some swelling from Actos treated with Lasix  Compliance with the medical regimen: Fair, he thinks he can do better on diet Hypoglycemia: None recently, previously was having some nocturnal hypoglycemia    Glucose monitoring:  done  times a day         Glucometer: Lamar Sprinkles    Blood Glucose readings by time of day and averages from download recently  Mean values apply above for all meters except median for One Touch  PRE-MEAL Fasting Lunch Dinner Bedtime Overall  Glucose range: 51-195   119, 267   49-152  57-287   Mean/median:  112    168 131  Self-care: The diet that the patient has been following is: tries to limit sweets.     Meal times: Breakfast: 9 AM.  Lunch: Variable, Dinner: 5 pm. Eating out 3/7 days a week in the evenings   Typical meal intake: Breakfast is Eggs, bacon and egg toast.  Lunch is a sandwich sometimes.  Dinner is meat and vegetables, has peanut butter snacks periodically at lunch or bedtime  Dietician visit, most recent: 15 yrs               Exercise:  none, has back pain  Weight history: 213 in 15  Wt Readings from Last 3 Encounters:  04/26/15 227 lb 6.4 oz (103.148 kg)  04/07/15 235 lb 4 oz (106.709 kg)  03/06/15 227 lb (102.967 kg)    Glycemic control:   Lab Results  Component Value Date   HGBA1C 8.0 03/06/2015   HGBA1C 7.8* 12/12/2014   HGBA1C 8.5* 08/02/2014   Lab  Results  Component Value Date   MICROALBUR 1.0 12/13/2014   LDLCALC 48 12/12/2014   CREATININE 1.36* 02/28/2015         Medication List       This list is accurate as of: 04/26/15  5:16 PM.  Always use your most recent med list.               albuterol 108 (90 BASE) MCG/ACT inhaler  Commonly known as:  PROVENTIL HFA;VENTOLIN HFA  Inhale 1-2 puffs into the lungs every 6 (six) hours as needed for wheezing.     aspirin EC 81 MG tablet  Take 81 mg by mouth at bedtime.     carvedilol 25 MG tablet  Commonly known as:  COREG  TAKE ONE TABLET BY MOUTH TWICE DAILY     citalopram 20 MG tablet  Commonly known as:  CELEXA  TAKE ONE TABLET BY MOUTH ONCE DAILY     cloNIDine 0.2 MG tablet  Commonly known as:  CATAPRES  TAKE ONE TABLET BY MOUTH TWICE DAILY     diazepam 5 MG tablet  Commonly known as:  VALIUM  TAKE ONE TABLET BY MOUTH TWICE DAILY AS NEEDED     doxazosin 8 MG tablet  Commonly known as:  CARDURA  Take 1 tablet (8 mg total) by mouth daily.     FREESTYLE LITE test strip  Generic drug:  glucose blood  USE  STRIP TO CHECK BLOOD SUGAR THREE TIMES DAILY     furosemide 20 MG tablet  Commonly known as:  LASIX  Take 1 tablet (20 mg total) by mouth daily.     gabapentin 300 MG capsule  Commonly known as:  NEURONTIN  Take 2 capsules (600 mg total) by mouth at bedtime.     multivitamin with minerals Tabs tablet  Take 1 tablet by mouth daily.     naphazoline-glycerin 0.012-0.2 % Soln  Commonly known as:  CLEAR EYES  Place 2 drops into both eyes 2 (two) times daily as needed for irritation.     oxyCODONE-acetaminophen 5-325 MG per tablet  Commonly known as:  PERCOCET  Take 1-2 tablets by mouth every 4 (four) hours as needed.     pioglitazone 30 MG tablet  Commonly known as:  ACTOS  Take 1 tablet (30 mg total) by mouth daily.     pravastatin 10 MG tablet  Commonly known as:  PRAVACHOL  TAKE ONE TABLET BY MOUTH ONCE DAILY AT BEDTIME     triamcinolone cream  0.1 %  Commonly known  as:  KENALOG  Apply 1 application topically 3 (three) times daily. To the affected areas     valsartan 320 MG tablet  Commonly known as:  DIOVAN  Take 1 tablet (320 mg total) by mouth daily.     Vitamin D (Ergocalciferol) 50000 UNITS Caps capsule  Commonly known as:  DRISDOL  Take 1 capsule (50,000 Units total) by mouth every 7 (seven) days.     Vitamin D (Ergocalciferol) 50000 UNITS Caps capsule  Commonly known as:  DRISDOL  Take 1 capsule (50,000 Units total) by mouth every 7 (seven) days.     ZENPEP 10000 UNITS Cpep  Generic drug:  Pancrelipase (Lip-Prot-Amyl)  TAKE 1 CAPSULE BY MOUTH THREE TIMES A DAY BEFORE MEALS        Allergies:  Allergies  Allergen Reactions  . Enalapril Maleate Cough    Past Medical History  Diagnosis Date  . Type 2 diabetes mellitus   . Essential hypertension, benign   . Prostate cancer 2011  . Mixed hyperlipidemia   . Chronic back pain     Disc disease  . Pancreatic insufficiency     Diagnosed at [facility]  . Diabetes mellitus   . Coronary atherosclerosis of native coronary artery     Coronary calcifications by chest CT, Myoview demonstrating inferolateral scar  . Diastolic dysfunction 01/3015    Grade 1.  . Hyperkalemia   . Kidney stones   . Chronic kidney disease   . Subdural hematoma     Past Surgical History  Procedure Laterality Date  . Prostatectomy  2011  . Hernia repair    . Appendectomy    . Cholecystectomy    . Bilateral hip replacement      Family History  Problem Relation Age of Onset  . Heart attack Father   . Diabetes type II Mother     Social History:  reports that he has never smoked. He has never used smokeless tobacco. He reports that he does not drink alcohol or use illicit drugs.    Review of Systems     Lipid history: On 69m Pravastatin    Lab Results  Component Value Date   CHOL 112 12/12/2014   HDL 37* 12/12/2014   LDLCALC 48 12/12/2014   TRIG 136 12/12/2014   CHOLHDL  3.0 12/12/2014            Most recent eye exam was in 1/16, apparently no retinopathy   Hypertension: Present, treated with carvedilol, clonidine, valsartan 320 mg and doxazosin.      He is taking gabapentin for his muscle cramps and not neuropathy  Endocrine: Hypogonadism has been present for at least 3 years but it made has been deferred by urologist because of his history of prostate cancer   Lab Results  Component Value Date   TESTOSTERONE 255* 02/28/2015      Physical Examination:  BP 110/68 mmHg  Pulse 63  Temp(Src) 98.2 F (36.8 C)  Resp 16  Ht 5' 9"  (1.753 m)  Wt 227 lb 6.4 oz (103.148 kg)  BMI 33.57 kg/m2  SpO2 97%    ASSESSMENT:  Diabetes type 2, uncontrolled    He has had difficult to control diabetes and currently is on NPH and Regular Insulin because he cannot afford brand-name insulins See history of present illness for detailed discussion of his current management, blood sugar patterns and problems identified  As discussed above his blood sugars are relatively good during the day but Significantly variable at bedtime because inconsistent  diet, sometimes not taking insulin before eating and also sometimes consuming regular soft drinks He is not able to adjust his mealtime coverage accurately on his own based on his diet, sometimes has relatively high fat meals also  Again discussed the V-go pump and he is agreeable to trying this.      PLAN:   Reduce both doses of NPH by 2 units to avoid potential hypoglycemia during the day and overnight  Adjust suppertime dose based on how much he is eating and take 10 units for medium-sized meals and 12-13 units for eating out  Avoid regular soft drinks  He will start the V-go pump when able to  Needs diabetes education and meal planning and education also  Discussed how the V-go pump will work and did not take NPH insulin when he is starting that morning.  He will follow-up of week later  Patient  Instructions  Reduce N insulin in am to 16 and bedtime dose to 8  Check blood sugars on waking up .Marland Kitchen 3-4 .Marland Kitchen times a week Also check blood sugars about 2 hours after a meal and do this after different meals by rotation  Recommended blood sugar levels on waking up is 90-130 and about 2 hours after meal is 140-180 Please bring blood sugar monitor to each visit.  Take R before meals when eating out  Bring R insulin for V-go start and no N insulin that am     Counseling time on subjects discussed above is over 50% of today's 25 minute visit   KUMAR,AJAY 04/26/2015, 5:16 PM   Note: This office note was prepared with Estate agent. Any transcriptional errors that result from this process are unintentional.

## 2015-04-26 NOTE — Patient Instructions (Addendum)
Reduce N insulin in am to 16 and bedtime dose to 8  Check blood sugars on waking up .Marland Kitchen 3-4 .Marland Kitchen times a week Also check blood sugars about 2 hours after a meal and do this after different meals by rotation  Recommended blood sugar levels on waking up is 90-130 and about 2 hours after meal is 140-180 Please bring blood sugar monitor to each visit.  Take R before meals when eating out  Bring R insulin for V-go start and no N insulin that am

## 2015-04-27 ENCOUNTER — Ambulatory Visit: Payer: Self-pay | Admitting: Family Medicine

## 2015-04-27 ENCOUNTER — Other Ambulatory Visit: Payer: PPO

## 2015-04-27 DIAGNOSIS — R799 Abnormal finding of blood chemistry, unspecified: Secondary | ICD-10-CM

## 2015-04-27 LAB — COMPREHENSIVE METABOLIC PANEL
ALBUMIN: 3.7 g/dL (ref 3.6–5.1)
ALK PHOS: 100 U/L (ref 40–115)
ALT: 38 U/L (ref 9–46)
AST: 56 U/L — AB (ref 10–35)
BILIRUBIN TOTAL: 0.5 mg/dL (ref 0.2–1.2)
BUN: 22 mg/dL (ref 7–25)
CO2: 20 mmol/L (ref 20–31)
CREATININE: 1.28 mg/dL — AB (ref 0.70–1.18)
Calcium: 8.4 mg/dL — ABNORMAL LOW (ref 8.6–10.3)
Chloride: 106 mmol/L (ref 98–110)
Glucose, Bld: 370 mg/dL — ABNORMAL HIGH (ref 70–99)
Potassium: 6.2 mmol/L (ref 3.5–5.3)
SODIUM: 136 mmol/L (ref 135–146)
Total Protein: 6.4 g/dL (ref 6.1–8.1)

## 2015-04-28 ENCOUNTER — Other Ambulatory Visit: Payer: Self-pay | Admitting: *Deleted

## 2015-04-28 DIAGNOSIS — E875 Hyperkalemia: Secondary | ICD-10-CM

## 2015-05-01 ENCOUNTER — Other Ambulatory Visit: Payer: PPO

## 2015-05-01 DIAGNOSIS — E875 Hyperkalemia: Secondary | ICD-10-CM

## 2015-05-01 LAB — COMPLETE METABOLIC PANEL WITH GFR
ALT: 44 U/L (ref 9–46)
AST: 36 U/L — AB (ref 10–35)
Albumin: 3.5 g/dL — ABNORMAL LOW (ref 3.6–5.1)
Alkaline Phosphatase: 127 U/L — ABNORMAL HIGH (ref 40–115)
BILIRUBIN TOTAL: 0.4 mg/dL (ref 0.2–1.2)
BUN: 25 mg/dL (ref 7–25)
CO2: 24 mmol/L (ref 20–31)
CREATININE: 1.35 mg/dL — AB (ref 0.70–1.18)
Calcium: 8.5 mg/dL — ABNORMAL LOW (ref 8.6–10.3)
Chloride: 109 mmol/L (ref 98–110)
GFR, EST NON AFRICAN AMERICAN: 51 mL/min — AB (ref >=60)
GFR, Est African American: 59 mL/min — ABNORMAL LOW (ref >=60)
GLUCOSE: 284 mg/dL — AB (ref 70–99)
Potassium: 4.3 mmol/L (ref 3.5–5.3)
SODIUM: 138 mmol/L (ref 135–146)
TOTAL PROTEIN: 6.1 g/dL (ref 6.1–8.1)

## 2015-05-02 ENCOUNTER — Encounter: Payer: Self-pay | Admitting: Family Medicine

## 2015-05-04 ENCOUNTER — Other Ambulatory Visit: Payer: Self-pay | Admitting: Family Medicine

## 2015-05-04 NOTE — Telephone Encounter (Signed)
Refill appropriate and filled per protocol. 

## 2015-05-10 ENCOUNTER — Other Ambulatory Visit: Payer: Self-pay | Admitting: *Deleted

## 2015-05-10 ENCOUNTER — Encounter: Payer: Self-pay | Admitting: *Deleted

## 2015-05-10 ENCOUNTER — Encounter: Payer: PPO | Attending: Endocrinology | Admitting: Nutrition

## 2015-05-10 DIAGNOSIS — Z794 Long term (current) use of insulin: Secondary | ICD-10-CM

## 2015-05-10 DIAGNOSIS — E1165 Type 2 diabetes mellitus with hyperglycemia: Secondary | ICD-10-CM | POA: Diagnosis not present

## 2015-05-10 MED ORDER — V-GO 20 KIT: PACK | Status: DC

## 2015-05-10 NOTE — Patient Instructions (Signed)
Fill and apply a new V-go every 24 hours with R insulin.   Stop all N insulin Test blood sugar before meals and at bedtime and call results to Dr. Dwyane Dee on Friday afternoon. See Dr. Dwyane Dee on Wednesday.  30 min. Before meals, take 3 button presses for breakfast, 4 for supper, and 1 for light lunch.   Call if questions.

## 2015-05-10 NOTE — Progress Notes (Signed)
[name] and his wife were instructed on how to fill apply and use the V-go 20.   Patient took his N insulin this morning at 8 AM and will not attach the V-go until tonight at [facility] before supper. He filled a V-go 20 with his R insulin without any difficulty.   Written instuctions were for 3 button presses before breakfast and lunch (if eaten), and 4 button presses before supper, per Dr. Ronnie Derby verbal order.  He sometimes will have a light lunch if he eats a late breakfast, with 15 carbs. He was told to take 1 button press for this.   He was given a log sheet and told to test his blood sugars before meals and at bedtime and to call the results to Dr. Ronnie Derby office on Friday afternoon.  He agreed to do this.   He was reminded that he is to stop all  N insulin.  He reported good understanding of this. He was given a V-go 20 starter kit with 7 V-Gos and directions for how to fill, apply and use the V-go.  There was also a telephone number to call if questions.   He had no final questions.

## 2015-05-11 ENCOUNTER — Telehealth: Payer: Self-pay | Admitting: Endocrinology

## 2015-05-11 NOTE — Telephone Encounter (Signed)
Patient would has question about his Vgp pump, please advise

## 2015-05-11 NOTE — Telephone Encounter (Signed)
Use 2 button presses before breakfast and lunch (if eaten), and 3 button presses before supper. If he has issues with the pump to call the 800#

## 2015-05-11 NOTE — Telephone Encounter (Signed)
Patient called today, no certain what he wanted but he said he started on the V-go yesterday, he took 3 clicks this morning before breakfast, 2 hours later his sugar was 286, after dinner around 7 pm it was 149, he had a snack then checked his sugar and it was 52, this morning before breakfast it was 72, he said it went back up and he checked the device and at 10 am it was empty and it hadn't been 24 hours, He said he did not do any other clicks and doesn't know why it emptied so fast.  He did put on a new pod today.

## 2015-05-12 NOTE — Telephone Encounter (Signed)
Noted, patient is aware.

## 2015-05-12 NOTE — Discharge Instructions (Signed)

## 2015-05-15 ENCOUNTER — Ambulatory Visit
Admission: RE | Admit: 2015-05-15 | Discharge: 2015-05-15 | Disposition: A | Discharge status: Home / Self Care | Payer: PPO | Source: Ambulatory Visit | Attending: Ophthalmology | Admitting: Ophthalmology

## 2015-05-15 ENCOUNTER — Encounter
Admission: RE | Disposition: A | Discharge status: Home / Self Care | Payer: Self-pay | Source: Ambulatory Visit | Attending: Ophthalmology

## 2015-05-15 ENCOUNTER — Ambulatory Visit: Payer: PPO | Admitting: Anesthesiology

## 2015-05-15 DIAGNOSIS — H2512 Age-related nuclear cataract, left eye: Secondary | ICD-10-CM | POA: Insufficient documentation

## 2015-05-15 DIAGNOSIS — F419 Anxiety disorder, unspecified: Secondary | ICD-10-CM | POA: Diagnosis not present

## 2015-05-15 DIAGNOSIS — Z9049 Acquired absence of other specified parts of digestive tract: Secondary | ICD-10-CM | POA: Insufficient documentation

## 2015-05-15 DIAGNOSIS — M7989 Other specified soft tissue disorders: Secondary | ICD-10-CM | POA: Diagnosis not present

## 2015-05-15 DIAGNOSIS — R011 Cardiac murmur, unspecified: Secondary | ICD-10-CM | POA: Insufficient documentation

## 2015-05-15 DIAGNOSIS — Z96643 Presence of artificial hip joint, bilateral: Secondary | ICD-10-CM | POA: Insufficient documentation

## 2015-05-15 DIAGNOSIS — Z8546 Personal history of malignant neoplasm of prostate: Secondary | ICD-10-CM | POA: Insufficient documentation

## 2015-05-15 DIAGNOSIS — S0282XD Fracture of other specified skull and facial bones, left side, subsequent encounter for fracture with routine healing: Secondary | ICD-10-CM | POA: Diagnosis not present

## 2015-05-15 DIAGNOSIS — G4762 Sleep related leg cramps: Secondary | ICD-10-CM | POA: Insufficient documentation

## 2015-05-15 DIAGNOSIS — H9193 Unspecified hearing loss, bilateral: Secondary | ICD-10-CM | POA: Diagnosis not present

## 2015-05-15 DIAGNOSIS — R0602 Shortness of breath: Secondary | ICD-10-CM | POA: Diagnosis not present

## 2015-05-15 DIAGNOSIS — E119 Type 2 diabetes mellitus without complications: Secondary | ICD-10-CM | POA: Insufficient documentation

## 2015-05-15 DIAGNOSIS — Z888 Allergy status to other drugs, medicaments and biological substances status: Secondary | ICD-10-CM | POA: Insufficient documentation

## 2015-05-15 DIAGNOSIS — Z9181 History of falling: Secondary | ICD-10-CM | POA: Insufficient documentation

## 2015-05-15 DIAGNOSIS — E7800 Pure hypercholesterolemia, unspecified: Secondary | ICD-10-CM | POA: Insufficient documentation

## 2015-05-15 DIAGNOSIS — K8689 Other specified diseases of pancreas: Secondary | ICD-10-CM | POA: Insufficient documentation

## 2015-05-15 DIAGNOSIS — I10 Essential (primary) hypertension: Secondary | ICD-10-CM | POA: Insufficient documentation

## 2015-05-15 DIAGNOSIS — Z85828 Personal history of other malignant neoplasm of skin: Secondary | ICD-10-CM | POA: Diagnosis not present

## 2015-05-15 DIAGNOSIS — X58XXXD Exposure to other specified factors, subsequent encounter: Secondary | ICD-10-CM | POA: Insufficient documentation

## 2015-05-15 HISTORY — DX: Cardiac murmur, unspecified: R01.1

## 2015-05-15 HISTORY — DX: Unspecified osteoarthritis, unspecified site: M19.90

## 2015-05-15 HISTORY — DX: Unspecified hearing loss, right ear: H91.91

## 2015-05-15 HISTORY — DX: Unspecified hearing loss, unspecified ear: H91.90

## 2015-05-15 HISTORY — DX: Reserved for inherently not codable concepts without codable children: IMO0001

## 2015-05-15 HISTORY — PX: CATARACT EXTRACTION W/PHACO: SHX586

## 2015-05-15 HISTORY — DX: Presence of external hearing-aid: Z97.4

## 2015-05-15 LAB — GLUCOSE, CAPILLARY
Glucose-Capillary: 152 mg/dL — ABNORMAL HIGH (ref 65–99)
Glucose-Capillary: 140 mg/dL — ABNORMAL HIGH (ref 65–99)

## 2015-05-15 SURGERY — PHACOEMULSIFICATION, CATARACT, WITH IOL INSERTION
Anesthesia: Monitor Anesthesia Care | Laterality: Left | Wound class: Clean

## 2015-05-15 MED ORDER — POVIDONE-IODINE 5 % OP SOLN
1.0000 "application " | OPHTHALMIC | Status: DC | PRN
  Administered 2015-05-15: 1 via OPHTHALMIC

## 2015-05-15 MED ORDER — TIMOLOL MALEATE 0.5 % OP SOLN
OPHTHALMIC | Status: DC | PRN
  Administered 2015-05-15: 1 [drp] via OPHTHALMIC

## 2015-05-15 MED ORDER — NA HYALUR & NA CHOND-NA HYALUR 0.4-0.35 ML IO KIT
PACK | INTRAOCULAR | Status: DC | PRN
  Administered 2015-05-15: 1 mL via INTRAOCULAR

## 2015-05-15 MED ORDER — FENTANYL CITRATE (PF) 100 MCG/2ML IJ SOLN
INTRAMUSCULAR | Status: DC | PRN
  Administered 2015-05-15 (×2): 50 ug via INTRAVENOUS

## 2015-05-15 MED ORDER — ARMC OPHTHALMIC DILATING GEL
1.0000 "application " | OPHTHALMIC | Status: DC | PRN
  Administered 2015-05-15 (×2): 1 via OPHTHALMIC

## 2015-05-15 MED ORDER — TETRACAINE HCL 0.5 % OP SOLN
1.0000 [drp] | OPHTHALMIC | Status: DC | PRN
  Administered 2015-05-15: 1 [drp] via OPHTHALMIC

## 2015-05-15 MED ORDER — LACTATED RINGERS IV SOLN: INTRAVENOUS | Status: DC

## 2015-05-15 MED ORDER — LIDOCAINE HCL (PF) 4 % IJ SOLN
INTRAOCULAR | Status: DC | PRN
  Administered 2015-05-15: 1 mL via OPHTHALMIC

## 2015-05-15 MED ORDER — BRIMONIDINE TARTRATE 0.2 % OP SOLN
OPHTHALMIC | Status: DC | PRN
  Administered 2015-05-15: 1 [drp] via OPHTHALMIC

## 2015-05-15 MED ORDER — CEFUROXIME OPHTHALMIC INJECTION 1 MG/0.1 ML
INJECTION | OPHTHALMIC | Status: DC | PRN
  Administered 2015-05-15: 0.1 mL via INTRACAMERAL

## 2015-05-15 MED ORDER — EPINEPHRINE HCL 1 MG/ML IJ SOLN
INTRAOCULAR | Status: DC | PRN
  Administered 2015-05-15: 85 mL via OPHTHALMIC

## 2015-05-15 MED ORDER — MIDAZOLAM HCL 2 MG/2ML IJ SOLN
INTRAMUSCULAR | Status: DC | PRN
  Administered 2015-05-15: 2 mg via INTRAVENOUS

## 2015-05-15 MED ORDER — BACITRACIN 500 UNIT/GM OP OINT: TOPICAL_OINTMENT | OPHTHALMIC | Status: DC | PRN

## 2015-05-15 SURGICAL SUPPLY — 29 items
APPLICATOR COTTON TIP 3IN (MISCELLANEOUS) ×3 IMPLANT
CANNULA ANT/CHMB 27GA (MISCELLANEOUS) ×6 IMPLANT
DISSECTOR HYDRO NUCLEUS 50X22 (MISCELLANEOUS) ×3 IMPLANT
GLOVE BIO SURGEON STRL SZ7 (GLOVE) ×3 IMPLANT
GLOVE SURG LX 6.5 MICRO (GLOVE) ×2
GLOVE SURG LX STRL 6.5 MICRO (GLOVE) ×1 IMPLANT
GOWN STRL REUS W/ TWL LRG LVL3 (GOWN DISPOSABLE) ×2 IMPLANT
GOWN STRL REUS W/TWL LRG LVL3 (GOWN DISPOSABLE) ×4
LENS IOL ACRSF IQ PC 22.5 (Intraocular Lens) ×1 IMPLANT
LENS IOL ACRYSOF IQ POST 22.5 (Intraocular Lens) ×3 IMPLANT
MARKER SKIN SURG W/RULER VIO (MISCELLANEOUS) ×3 IMPLANT
NEEDLE FILTER BLUNT 18X 1/2SAF (NEEDLE) ×4
NEEDLE FILTER BLUNT 18X1 1/2 (NEEDLE) ×2 IMPLANT
PACK CATARACT BRASINGTON (MISCELLANEOUS) ×3 IMPLANT
PACK EYE AFTER SURG (MISCELLANEOUS) ×3 IMPLANT
PACK OPTHALMIC (MISCELLANEOUS) ×3 IMPLANT
RING MALYGIN 7.0 (MISCELLANEOUS) IMPLANT
SOL BAL SALT 15ML (MISCELLANEOUS)
SOLUTION BAL SALT 15ML (MISCELLANEOUS) IMPLANT
SUT ETHILON 10-0 CS-B-6CS-B-6 (SUTURE)
SUT VICRYL  9 0 (SUTURE)
SUT VICRYL 9 0 (SUTURE) IMPLANT
SUTURE EHLN 10-0 CS-B-6CS-B-6 (SUTURE) IMPLANT
SYR 3ML LL SCALE MARK (SYRINGE) ×6 IMPLANT
SYR TB 1ML LUER SLIP (SYRINGE) ×3 IMPLANT
WATER STERILE IRR 250ML POUR (IV SOLUTION) ×3 IMPLANT
WATER STERILE IRR 500ML POUR (IV SOLUTION) IMPLANT
WICK EYE OCUCEL (MISCELLANEOUS) IMPLANT
WIPE NON LINTING 3.25X3.25 (MISCELLANEOUS) ×3 IMPLANT

## 2015-05-15 NOTE — Anesthesia Preprocedure Evaluation (Addendum)
Anesthesia Evaluation  Patient identified by MRN, date of birth, ID band  Reviewed: NPO status   History of Anesthesia Complications Negative for: history of anesthetic complications  Airway Mallampati: II  TM Distance: >3 FB Neck ROM: full   Comment: [place] no notable dental hx.    Pulmonary neg pulmonary ROS,    Pulmonary exam normal        Cardiovascular Exercise Tolerance: Good hypertension, (-) CAD (denies) Normal cardiovascular exam     Neuro/Psych Anxiety HOH  negative psych ROS   GI/Hepatic Neg liver ROS, Pancreas dysfunction   Endo/Other  diabetes  Renal/GU CRF  negative genitourinary   Musculoskeletal  (+) Arthritis , Leg cramps    Abdominal   Peds  Hematology negative hematology ROS (+) Prost cancer 2012    Anesthesia Other Findings   Reproductive/Obstetrics                            Anesthesia Physical Anesthesia Plan  ASA: III  Anesthesia Plan: MAC   Post-op Pain Management:    Induction:   Airway Management Planned:   Additional Equipment:   Intra-op Plan:   Post-operative Plan:   Informed Consent: I have reviewed the patients History and Physical, chart, labs and discussed the procedure including the risks, benefits and alternatives for the proposed anesthesia with the patient or authorized representative who has indicated his/her understanding and acceptance.     Plan Discussed with: CRNA  Anesthesia Plan Comments:        Anesthesia Quick Evaluation

## 2015-05-15 NOTE — Transfer of Care (Signed)
Immediate Anesthesia Transfer of Care Note  Patient: Mark Jumbo Sr.  Procedure(s) Performed: Procedure(s) with comments: CATARACT EXTRACTION PHACO AND INTRAOCULAR LENS PLACEMENT (IOC) (Left) - DIABETIC - insulin pump and oral meds  Patient Location: PACU  Anesthesia Type: MAC  Level of Consciousness: awake, alert  and patient cooperative  Airway and Oxygen Therapy: Patient Spontanous Breathing and Patient connected to supplemental oxygen  Post-op Assessment: Post-op Vital signs reviewed, Patient's Cardiovascular Status Stable, Respiratory Function Stable, Patent Airway and No signs of Nausea or vomiting  Post-op Vital Signs: Reviewed and stable  Complications: No apparent anesthesia complications

## 2015-05-15 NOTE — H&P (Signed)
H+P reviewed and is up to date, please see paper chart.

## 2015-05-15 NOTE — Anesthesia Postprocedure Evaluation (Signed)
  Anesthesia Post-op Note  Patient: Mark Jumbo Sr.  Procedure(s) Performed: Procedure(s) with comments: CATARACT EXTRACTION PHACO AND INTRAOCULAR LENS PLACEMENT (IOC) (Left) - DIABETIC - insulin pump and oral meds  Anesthesia type:MAC  Patient location: PACU  Post pain: Pain level controlled  Post assessment: Post-op Vital signs reviewed, Patient's Cardiovascular Status Stable, Respiratory Function Stable, Patent Airway and No signs of Nausea or vomiting  Post vital signs: Reviewed and stable  Last Vitals:  Filed Vitals:   05/15/15 0830  BP: 119/68  Pulse: 73  Temp:   Resp: 13    Level of consciousness: awake, alert  and patient cooperative  Complications: No apparent anesthesia complications

## 2015-05-15 NOTE — Op Note (Signed)
Date of Surgery: 05/15/2015  PREOPERATIVE DIAGNOSES: Visually significant nuclear sclerotic cataract, left eye.  POSTOPERATIVE DIAGNOSES: Same  PROCEDURES PERFORMED: Cataract extraction with intraocular lens implant, left eye.  SURGEON: Almon Hercules, M.D.  ANESTHESIA: MAC and topical  IMPLANTS: AcrySof IQ [id number] +22.5D   Implant Name Type Inv. Item Serial No. Manufacturer Lot No. LRB No. Used  IMPLANT LENS - [id number] Intraocular Lens IMPLANT LENS [id number] ALCON   Left 1    COMPLICATIONS: None.  DESCRIPTION OF PROCEDURE: Therapeutic options were discussed with the patient preoperatively, including a discussion of risks and benefits of surgery. Informed consent was obtained. An IOL-Master and immersion biometry were used to take the lens measurements, and a dilated fundus exam was performed within 6 months of the surgical date.  The patient was premedicated and brought to the operating room and placed on the operating table in the supine position. After adequate anesthesia, the patient was prepped and draped in the usual sterile ophthalmic fashion. A wire lid speculum was inserted and the microscope was positioned. A Superblade was used to create a paracentesis site at the limbus and a small amount of dilute preservative free lidocaine was instilled into the anterior chamber, followed by dispersive viscoelastic. A clear corneal incision was created temporally using a 2.4 mm keratome blade. Capsulorrhexis was then performed. In situ phacoemulsification was performed.  Cortical material was removed with the irrigation-aspiration unit. Dispersive viscoelastic was instilled to open the capsular bag. A posterior chamber intraocular lens with the specifications above was inserted and positioned. Irrigation-aspiration was used to remove all viscoelastic. Cefuroxime 1cc was instilled into the anterior chamber, and the corneal incision was checked and found to be water tight. The eyelid speculum  was removed.  The operative eye was covered with protective goggles after instilling 1 drop of timolol and brimonidine. The patient tolerated the procedure well. There were no complications.

## 2015-05-15 NOTE — Anesthesia Procedure Notes (Signed)
Procedure Name: MAC Performed by: Alexiz Cothran Pre-anesthesia Checklist: Patient identified, Emergency Drugs available, Suction available, Timeout performed and Patient being monitored Patient Re-evaluated:Patient Re-evaluated prior to inductionOxygen Delivery Method: Nasal cannula Placement Confirmation: positive ETCO2       

## 2015-05-16 ENCOUNTER — Other Ambulatory Visit: Payer: Self-pay | Admitting: Family Medicine

## 2015-05-16 ENCOUNTER — Encounter: Payer: Self-pay | Admitting: Ophthalmology

## 2015-05-16 NOTE — Telephone Encounter (Signed)
ok 

## 2015-05-16 NOTE — Telephone Encounter (Signed)
LRF 04/04/15 #30.  LOV 02/28/15.  OK refill?

## 2015-05-17 ENCOUNTER — Ambulatory Visit (INDEPENDENT_AMBULATORY_CARE_PROVIDER_SITE_OTHER): Payer: PPO | Admitting: Endocrinology

## 2015-05-17 ENCOUNTER — Other Ambulatory Visit: Payer: Self-pay | Admitting: *Deleted

## 2015-05-17 DIAGNOSIS — E1165 Type 2 diabetes mellitus with hyperglycemia: Secondary | ICD-10-CM

## 2015-05-17 DIAGNOSIS — Z794 Long term (current) use of insulin: Secondary | ICD-10-CM | POA: Diagnosis not present

## 2015-05-17 MED ORDER — GLUCOSE BLOOD VI STRP: ORAL_STRIP | Status: DC

## 2015-05-17 NOTE — Patient Instructions (Signed)
Stop Actos

## 2015-05-17 NOTE — Progress Notes (Signed)
Patient ID: Mark L Moorer Sr., male   DOB: 06/01/1941, 74 y.o.   MRN: [id number]           Reason for Appointment: Follow-up for Type 2 Diabetes  Referring physician: Pickard  History of Present Illness:          Date of diagnosis of type 2 diabetes mellitus :        Background history:  The first few years of his diabetes he was treated with Metformin At some point he was also given Actos by his PCP for control However because of worsening control he thinks he was started on insulin probably Lantus in 2005  Recent history:  His Lantus insulin was changed to 70/30 a couple of years ago because of his difficulty affording the Lantus However with this his blood sugars have not been optimally controlled and A1c has been persistently high  His A1c was around 9% in 6/15and at that time he was started back on Actos, initially 15 mg and then 30 mg in September With this his blood sugars improved somewhat He was referred here because of persistently high A1c of 7.8 in 5/16 He was taking 45 units a day of premixed insulin in 2 divided doses and was changed to NPH and regular insulin for better control  INSULIN regimen :  V-Go 20, boluses 6 units bid   With NPH and Regular Insulin in his blood sugars had been fluctuating somewhat but his control was not adequate A1c was still high at 8% last month To improve his control and simplify his treatment regimen he was started on the V-go pump with the 20 unit of basal on 05/10/15 He has been able to use this without difficulties and he likes the convenience of this  Current blood sugar patterns and problems identified:  His fasting blood sugars have been fairly good with only one low blood sugar on 10/8 when he had breakfast very late that morning  Initially with starting the pump he thought the insulin ran out quickly and that sugar went up at lunchtime to 286 but has not had this problem subsequently  He did not wear his pump on a vent for  his cataract surgery on Monday and his blood sugars were higher in the evening  Hypoglycemia: He did have a low blood sugar with taking 8 units of bolus on the first night of the pump as well as low normal readings in the 60s twice before supper  Bedtime readings have been recently fairly good,: Does not check readings 2 hours PC  With eating out at restaurants he tends to have higher blood sugars but yesterday his blood sugar was only 67 before supper, had seafood restaurant food at lunchtime  Oral hypoglycemic drugs the patient is taking are: Actos 30 mg daily      Side effects from medications have been: Some swelling from Actos treated with Lasix  Compliance with the medical regimen: Fair, he thinks he can do better on diet Hypoglycemia: Light flickers, off balance   Glucose monitoring:  done  times a day         Glucometer: Freesyle    Blood Glucose readings by time of day and averages from download recently  Mean values apply above for all meters except median for One Touch  PRE-MEAL Fasting  1-4 PM  Dinner Bedtime Overall  Glucose range:  59-150   230, 279   67-137   52-218    Mean/median:       136   Self-care: The diet that the patient has been following is: tries to limit sweets.     Meal times: Breakfast: 9 AM.  Lunch: Variable, Dinner: 5 pm. Eating out 3/7 days a week in the evenings   Typical meal intake: Breakfast is Eggs, bacon and egg toast.  Lunch is a sandwich sometimes, usually a snack.  Dinner is meat and vegetables, has peanut butter snacks periodically at lunch or bedtime  Dietician visit, most recent: 15 yrs               Exercise:  none, has back pain  Weight history: 213 in 15  Wt Readings from Last 3 Encounters:  05/15/15 223 lb (101.152 kg)  04/26/15 227 lb 6.4 oz (103.148 kg)  04/07/15 235 lb 4 oz (106.709 kg)    Glycemic control:   Lab Results  Component Value Date   HGBA1C 8.0 03/06/2015   HGBA1C 7.8* 12/12/2014   HGBA1C 8.5* 08/02/2014   Lab  Results  Component Value Date   MICROALBUR 1.0 12/13/2014   LDLCALC 48 12/12/2014   CREATININE 1.35* 05/01/2015         Medication List       This list is accurate as of: 05/17/15  1:05 PM.  Always use your most recent med list.               albuterol 108 (90 BASE) MCG/ACT inhaler  Commonly known as:  PROVENTIL HFA;VENTOLIN HFA  Inhale 1-2 puffs into the lungs every 6 (six) hours as needed for wheezing.     aspirin EC 81 MG tablet  Take 81 mg by mouth at bedtime.     carvedilol 25 MG tablet  Commonly known as:  COREG  TAKE ONE TABLET BY MOUTH TWICE DAILY     citalopram 20 MG tablet  Commonly known as:  CELEXA  TAKE ONE TABLET BY MOUTH ONCE DAILY     cloNIDine 0.2 MG tablet  Commonly known as:  CATAPRES  TAKE ONE TABLET BY MOUTH TWICE DAILY     diazepam 5 MG tablet  Commonly known as:  VALIUM  TAKE ONE TABLET BY MOUTH TWICE DAILY AS NEEDED FOR  ANXIETY     doxazosin 8 MG tablet  Commonly known as:  CARDURA  Take 1 tablet (8 mg total) by mouth daily.     doxazosin 8 MG tablet  Commonly known as:  CARDURA  TAKE ONE TABLET BY MOUTH ONCE DAILY     FREESTYLE LITE test strip  Generic drug:  glucose blood  USE  STRIP TO CHECK BLOOD SUGAR THREE TIMES DAILY     furosemide 20 MG tablet  Commonly known as:  LASIX  Take 1 tablet (20 mg total) by mouth daily.     gabapentin 300 MG capsule  Commonly known as:  NEURONTIN  Take 2 capsules (600 mg total) by mouth at bedtime.     insulin pump Soln  Inject into the skin.     multivitamin with minerals Tabs tablet  Take 1 tablet by mouth daily.     naphazoline-glycerin 0.012-0.2 % Soln  Commonly known as:  CLEAR EYES  Place 2 drops into both eyes 2 (two) times daily as needed for irritation.     oxyCODONE-acetaminophen 5-325 MG tablet  Commonly known as:  PERCOCET  Take 1-2 tablets by mouth every 4 (four) hours as needed.     pioglitazone 30 MG tablet  Commonly known as:  ACTOS  Take 1 tablet (  30 mg total)  by mouth daily.     pravastatin 10 MG tablet  Commonly known as:  PRAVACHOL  TAKE ONE TABLET BY MOUTH ONCE DAILY AT BEDTIME     triamcinolone cream 0.1 %  Commonly known as:  KENALOG  Apply 1 application topically 3 (three) times daily. To the affected areas     V-GO 20 Kit  Use one per day     valsartan 320 MG tablet  Commonly known as:  DIOVAN  Take 1 tablet (320 mg total) by mouth daily.     Vitamin D (Ergocalciferol) 50000 UNITS Caps capsule  Commonly known as:  DRISDOL  Take 1 capsule (50,000 Units total) by mouth every 7 (seven) days.     Vitamin D (Ergocalciferol) 50000 UNITS Caps capsule  Commonly known as:  DRISDOL  Take 1 capsule (50,000 Units total) by mouth every 7 (seven) days.     ZENPEP 10000 UNITS Cpep  Generic drug:  Pancrelipase (Lip-Prot-Amyl)  TAKE 1 CAPSULE BY MOUTH THREE TIMES A DAY BEFORE MEALS        Allergies:  Allergies  Allergen Reactions  . Enalapril Maleate Cough    Past Medical History  Diagnosis Date  . Type 2 diabetes mellitus (HCC)   . Essential hypertension, benign   . Prostate cancer (HCC) 2011  . Mixed hyperlipidemia   . Chronic back pain     Disc disease  . Pancreatic insufficiency (HCC)     Diagnosed at [facility]  . Diabetes mellitus   . Coronary atherosclerosis of native coronary artery     Coronary calcifications by chest CT, Myoview demonstrating inferolateral scar  . Diastolic dysfunction 12/2011    Grade 1.  . Hyperkalemia   . Kidney stones   . Chronic kidney disease   . Subdural hematoma (HCC)   . Arthritis     lower back  . Shortness of breath dyspnea     occasional - liver pressing on right lung, decreased capacity  . Heart murmur     in past  . HOH (hard of hearing)   . Wears hearing aid     "crossover" form right to left  . Deafness in right ear     Past Surgical History  Procedure Laterality Date  . Prostatectomy  2011  . Hernia repair    . Appendectomy    . Cholecystectomy    . Bilateral hip  replacement    . Cataract extraction w/phaco Left 05/15/2015    Procedure: CATARACT EXTRACTION PHACO AND INTRAOCULAR LENS PLACEMENT (IOC);  Surgeon: Anita Prakash Vin-Parikh, MD;  Location: [facility];  Service: Ophthalmology;  Laterality: Left;  DIABETIC - insulin pump and oral meds    Family History  Problem Relation Age of Onset  . Heart attack Father   . Diabetes type II Mother     Social History:  reports that he has never smoked. He has never used smokeless tobacco. He reports that he does not drink alcohol or use illicit drugs.    Review of Systems   The following is a copy of previous notes:  Lipid history: On 10mg Pravastatin    Lab Results  Component Value Date   CHOL 112 12/12/2014   HDL 37* 12/12/2014   LDLCALC 48 12/12/2014   TRIG 136 12/12/2014   CHOLHDL 3.0 12/12/2014            Most recent eye exam was in 1/16, apparently no retinopathy   Hypertension: Present, treated with carvedilol, clonidine,   valsartan 320 mg and doxazosin.      He is taking gabapentin for his muscle cramps and not neuropathy  Endocrine: Hypogonadism has been present for at least 3 years but it made has been deferred by urologist because of his history of prostate cancer   Lab Results  Component Value Date   TESTOSTERONE 255* 02/28/2015      Physical Examination:  Not indicated   ASSESSMENT:  Diabetes type 2, uncontrolled    He has had difficult to control diabetes and currently is on a V-go pump which appears to be giving him more consistent controlled than NPH and Regular Insulin  See history of present illness for detailed discussion of his current management, blood sugar patterns and problems identified  Currently he is taking about 32-34 units of insulin a day compared to up to 48 units previously with injections Subjectively also he is liking the V-go pump and has no technical problems with using  However he does tend to have low normal readings sometimes  at suppertime and once in the morning   PLAN:   Continue the 20 units basal with the V-go pump  Adjust bolus dose based on meal size, may need 4 units with larger meals at lunchtime but also needs to check more readings 2 hours after both breakfast and lunch which he is not doing much  Discussed prevention and treatment of hypoglycemia  For now he can generally use the same amount of insulin with 3 clicks at suppertime  May need to start click for large snacks  Avoid regular soft drinks  Recheck A1c on the next visit  Stop Actos as he is tending to get some hypoglycemia now before meals and also may benefit from less effect on his weight and fluid retention  Counseling time on subjects discussed above is over 50% of today's 25 minute visit   KUMAR,AJAY 05/17/2015, 1:05 PM   Note: This office note was prepared with Dragon voice recognition system technology. Any transcriptional errors that result from this process are unintentional.    

## 2015-05-17 NOTE — Telephone Encounter (Signed)
Medication refilled per protocol. 

## 2015-05-22 ENCOUNTER — Ambulatory Visit: Payer: PPO | Admitting: Endocrinology

## 2015-06-12 ENCOUNTER — Other Ambulatory Visit: Payer: PPO

## 2015-06-12 DIAGNOSIS — E875 Hyperkalemia: Secondary | ICD-10-CM

## 2015-06-12 LAB — BASIC METABOLIC PANEL
BUN: 18 mg/dL (ref 7–25)
CO2: 24 mmol/L (ref 20–31)
Calcium: 8.3 mg/dL — ABNORMAL LOW (ref 8.6–10.3)
Chloride: 105 mmol/L (ref 98–110)
Creat: 1.22 mg/dL — ABNORMAL HIGH (ref 0.70–1.18)
Glucose, Bld: 239 mg/dL — ABNORMAL HIGH (ref 70–99)
Potassium: 4 mmol/L (ref 3.5–5.3)
SODIUM: 139 mmol/L (ref 135–146)

## 2015-06-13 ENCOUNTER — Other Ambulatory Visit: Payer: PPO

## 2015-06-28 ENCOUNTER — Ambulatory Visit: Payer: PPO | Admitting: Endocrinology

## 2015-07-11 ENCOUNTER — Ambulatory Visit (INDEPENDENT_AMBULATORY_CARE_PROVIDER_SITE_OTHER): Payer: PPO | Admitting: Family Medicine

## 2015-07-11 ENCOUNTER — Encounter: Payer: Self-pay | Admitting: Family Medicine

## 2015-07-11 VITALS — BP 118/78 | HR 84 | Temp 98.3°F | Resp 18 | Ht 69.0 in | Wt 227.0 lb

## 2015-07-11 DIAGNOSIS — M549 Dorsalgia, unspecified: Secondary | ICD-10-CM | POA: Diagnosis not present

## 2015-07-11 DIAGNOSIS — Z23 Encounter for immunization: Secondary | ICD-10-CM | POA: Diagnosis not present

## 2015-07-11 LAB — URINALYSIS, ROUTINE W REFLEX MICROSCOPIC
BILIRUBIN URINE: NEGATIVE
Glucose, UA: NEGATIVE
Hgb urine dipstick: NEGATIVE
Ketones, ur: NEGATIVE
Leukocytes, UA: NEGATIVE
NITRITE: NEGATIVE
PROTEIN: NEGATIVE
pH: 5.5 (ref 5.0–8.0)

## 2015-07-11 NOTE — Addendum Note (Signed)
Addended by: Shary Decamp B on: 07/11/2015 12:43 PM   Modules accepted: Orders

## 2015-07-11 NOTE — Progress Notes (Signed)
Subjective:    Patient ID: Mark Hose., male    DOB: 1940/12/03, 74 y.o.   MRN: [id number]  HPI Last week, the patient developed the sudden onset of pain in his left flank near his left kidney. The pain was constant and throbbing. There was no exacerbating or alleviating factors. It did not move it did not radiate. It lasted for 4 days and then suddenly Sunday afternoon it relieved and he has been pain-free since. When the pain was bad, movement did not make it worse. He denied any exacerbation of the pain with forward flexion or extension. He denied any numbness or tingling in his legs or symptoms of cauda equina syndrome. He denied any dysuria or hematuria. Coincidentally, the patient had a 5 x 3 mm kidney stone seen in the midportion of the left kidney on a CT scan in 2014 that to date had not passed as far as we know. Patient has been asymptomatic for 3 days. Urinalysis today is negative. Past Medical History  Diagnosis Date  . Type 2 diabetes mellitus ([place])   . Essential hypertension, benign   . Prostate cancer ([place]) 2011  . Mixed hyperlipidemia   . Chronic back pain     Disc disease  . Pancreatic insufficiency (HCC)     Diagnosed at [facility]  . Diabetes mellitus   . Coronary atherosclerosis of native coronary artery     Coronary calcifications by chest CT, Myoview demonstrating inferolateral scar  . Diastolic dysfunction 12/6312    Grade 1.  . Hyperkalemia   . Kidney stones   . Chronic kidney disease   . Subdural hematoma ([place])   . Arthritis     lower back  . Shortness of breath dyspnea     occasional - liver pressing on right lung, decreased capacity  . Heart murmur     in past  . HOH (hard of hearing)   . Wears hearing aid     "crossover" form right to left  . Deafness in right ear    Past Surgical History  Procedure Laterality Date  . Prostatectomy  2011  . Hernia repair    . Appendectomy    . Cholecystectomy    . Bilateral hip replacement    . Cataract  extraction w/phaco Left 05/15/2015    Procedure: CATARACT EXTRACTION PHACO AND INTRAOCULAR LENS PLACEMENT (IOC);  Surgeon: Ronnell Freshwater, MD;  Location: [facility];  Service: Ophthalmology;  Laterality: Left;  DIABETIC - insulin pump and oral meds   Current Outpatient Prescriptions on File Prior to Visit  Medication Sig Dispense Refill  . albuterol (PROVENTIL HFA;VENTOLIN HFA) 108 (90 BASE) MCG/ACT inhaler Inhale 1-2 puffs into the lungs every 6 (six) hours as needed for wheezing. 1 Inhaler 0  . aspirin EC 81 MG tablet Take 81 mg by mouth at bedtime.     . carvedilol (COREG) 25 MG tablet TAKE ONE TABLET BY MOUTH TWICE DAILY 180 tablet 1  . citalopram (CELEXA) 20 MG tablet TAKE ONE TABLET BY MOUTH ONCE DAILY 90 tablet 3  . cloNIDine (CATAPRES) 0.2 MG tablet TAKE ONE TABLET BY MOUTH TWICE DAILY 60 tablet 11  . diazepam (VALIUM) 5 MG tablet TAKE ONE TABLET BY MOUTH TWICE DAILY AS NEEDED FOR  ANXIETY 60 tablet 0  . doxazosin (CARDURA) 8 MG tablet Take 1 tablet (8 mg total) by mouth daily. 90 tablet 3  . doxazosin (CARDURA) 8 MG tablet TAKE ONE TABLET BY MOUTH ONCE DAILY 30 tablet  6  . furosemide (LASIX) 20 MG tablet Take 1 tablet (20 mg total) by mouth daily. 90 tablet 3  . gabapentin (NEURONTIN) 300 MG capsule Take 2 capsules (600 mg total) by mouth at bedtime. 180 capsule 3  . glucose blood (FREESTYLE LITE) test strip USE  STRIP TO CHECK BLOOD SUGAR THREE TIMES DAILY DX CODE E11.9 100 each 5  . Insulin Disposable Pump (V-GO 20) KIT Use one per day 1 kit 3  . Insulin Human (INSULIN PUMP) SOLN Inject into the skin.    . Multiple Vitamin (MULTIVITAMIN WITH MINERALS) TABS Take 1 tablet by mouth daily.    . naphazoline-glycerin (CLEAR EYES) 0.012-0.2 % SOLN Place 2 drops into both eyes 2 (two) times daily as needed for irritation.    Marland Kitchen oxyCODONE-acetaminophen (PERCOCET) 5-325 MG per tablet Take 1-2 tablets by mouth every 4 (four) hours as needed. 10 tablet 0  . pioglitazone (ACTOS)  30 MG tablet Take 1 tablet (30 mg total) by mouth daily. 90 tablet 3  . pravastatin (PRAVACHOL) 10 MG tablet TAKE ONE TABLET BY MOUTH ONCE DAILY AT BEDTIME 90 tablet 1  . triamcinolone cream (KENALOG) 0.1 % Apply 1 application topically 3 (three) times daily. To the affected areas 30 g 0  . valsartan (DIOVAN) 320 MG tablet Take 1 tablet (320 mg total) by mouth daily. 90 tablet 3  . Vitamin D, Ergocalciferol, (DRISDOL) 50000 UNITS CAPS capsule Take 1 capsule (50,000 Units total) by mouth every 7 (seven) days. 4 capsule 6  . Vitamin D, Ergocalciferol, (DRISDOL) 50000 UNITS CAPS capsule Take 1 capsule (50,000 Units total) by mouth every 7 (seven) days. 30 capsule 0  . ZENPEP 10000 UNITS CPEP TAKE 1 CAPSULE BY MOUTH THREE TIMES A DAY BEFORE MEALS 90 capsule 2   No current facility-administered medications on file prior to visit.   Allergies  Allergen Reactions  . Enalapril Maleate Cough   Social History   Social History  . Marital Status: Married    Spouse Name: N/A  . Number of Children: N/A  . Years of Education: N/A   Occupational History  . Not on file.   Social History Main Topics  . Smoking status: Never Smoker   . Smokeless tobacco: Never Used  . Alcohol Use: No  . Drug Use: No  . Sexual Activity: Not on file   Other Topics Concern  . Not on file   Social History Narrative      Review of Systems  All other systems reviewed and are negative.      Objective:   Physical Exam  Cardiovascular: Normal rate, regular rhythm and normal heart sounds.   No murmur heard. Pulmonary/Chest: Effort normal and breath sounds normal. No respiratory distress. He has no wheezes. He has no rales.  Abdominal: Soft. Bowel sounds are normal. He exhibits no distension. There is no tenderness. There is no rebound and no guarding.  Musculoskeletal:       Lumbar back: He exhibits decreased range of motion. He exhibits no tenderness, no bony tenderness, no pain and no spasm.  Vitals  reviewed.         Assessment & Plan:  Back pain, acute - Plan: Urinalysis, Routine w reflex microscopic (not at [facility])  I suspect the patient may have passed a kidney stone. Either that or he had pulled a muscle in his lower back. He denies any gastrointestinal symptoms. At present he has been asymptomatic for 3 days. Therefore I recommended clinical monitoring. If symptoms return,  I would obtain a CT scan to evaluate for nephrolithiasis. Symptoms do not sound musculoskeletal in nature

## 2015-07-17 ENCOUNTER — Ambulatory Visit (INDEPENDENT_AMBULATORY_CARE_PROVIDER_SITE_OTHER): Payer: PPO | Admitting: Endocrinology

## 2015-07-17 VITALS — BP 118/70 | HR 63 | Resp 14 | Ht 69.0 in | Wt 224.4 lb

## 2015-07-17 DIAGNOSIS — Z794 Long term (current) use of insulin: Secondary | ICD-10-CM

## 2015-07-17 DIAGNOSIS — Z23 Encounter for immunization: Secondary | ICD-10-CM

## 2015-07-17 DIAGNOSIS — E1165 Type 2 diabetes mellitus with hyperglycemia: Secondary | ICD-10-CM

## 2015-07-17 LAB — POCT GLYCOSYLATED HEMOGLOBIN (HGB A1C): Hemoglobin A1C: 7.9

## 2015-07-17 NOTE — Patient Instructions (Signed)
Reduce clicks at Bfst to 2  If planning to be active take 1 less click Eat on time  BOLUS CLICKS 69-86 MIN before meals  Extra click for larger meals or eating out

## 2015-07-17 NOTE — Progress Notes (Signed)
Patient ID: [name]., male   DOB: 1941-08-02, 74 y.o.   MRN: [id number]           Reason for Appointment: Follow-up for Type 2 Diabetes  Referring physician: Pickard  History of Present Illness:          Date of diagnosis of type 2 diabetes mellitus :        Background history:  The first few years of his diabetes he was treated with Metformin At some point he was also given Actos by his PCP for control However because of worsening control he thinks he was started on insulin probably Lantus in 2005 and subsequently 70/30 insulin  Recent history:  INSULIN regimen :  V-Go 20, boluses 6 units bid   He was referred here because of persistently high A1c of 7.8 in 5/16 He was taking 45 units a day of premixed insulin in 2 divided doses  Because of poor control and variability he was started on the V-go pump with the 20 unit of basal on 05/10/15  He has been able to use this without any problems and he likes the convenience of this  Current blood sugar patterns and problems identified:  His fasting blood sugars have been fairly good with some variability  He has not checked many readings after breakfast or lunch lately  He has a tendency to low normal or low readings in the afternoons which he thinks is because he is outside being active and does not eat on time  Currently not adjusting his mealtime dose for his activity level which is usually during the midday  He has markedly variable blood sugars after supper; some of the very high readings are related to eating out, getting higher fat meals and also occasionally to forgetting the boluses when he is eating  In the last few days he has been having relatively better readings after supper with trying to bolus consistently and occasionally will take an extra 2 units for eating out; occasionally will overestimate and has had a couple of readings in the 60s at bedtime also  Oral hypoglycemic drugs the patient is taking are:  Actos 30 mg daily      Side effects from medications have been: Some swelling from Actos treated with Lasix  Compliance with the medical regimen: Fair, he thinks he can do better on diet Hypoglycemia: Symptoms are as follows: Sees lights flickering, off balance   Glucose monitoring:  done  times a day         Glucometer: Lamar Sprinkles    Blood Glucose readings by time of day and averages from download recently  Mean values apply above for all meters except median for One Touch  PRE-MEAL Fasting Lunch Dinner Bedtime Overall  Glucose range:  73-203   62-223   68-190   63-309    Mean/median:  130     161  136 +/-55    Self-care: The diet that the patient has been following is: tries to limit sweets.     Meal times: Breakfast: 9 AM.  Lunch: Variable, Dinner: 5 pm. Eating out 3/7 days a week in the evenings   Typical meal intake: Breakfast is Eggs, bacon and egg toast.  Lunch is a sandwich sometimes, usually a snack.  Dinner is meat and vegetables, has peanut butter snacks periodically at lunch or bedtime  Dietician visit, most recent: 15 yrs               Exercise:  active in workshop, walking some.   Weight history: 213 in 15  Wt Readings from Last 3 Encounters:  07/17/15 224 lb 6.4 oz (101.787 kg)  07/11/15 227 lb (102.967 kg)  05/15/15 223 lb (101.152 kg)    Glycemic control:   Lab Results  Component Value Date   HGBA1C 7.9 07/17/2015   HGBA1C 8.0 03/06/2015   HGBA1C 7.8* 12/12/2014   Lab Results  Component Value Date   MICROALBUR 1.0 12/13/2014   LDLCALC 48 12/12/2014   CREATININE 1.22* 06/12/2015         Medication List       This list is accurate as of: 07/17/15  3:55 PM.  Always use your most recent med list.               aspirin EC 81 MG tablet  Take 81 mg by mouth at bedtime.     carvedilol 25 MG tablet  Commonly known as:  COREG  TAKE ONE TABLET BY MOUTH TWICE DAILY     citalopram 20 MG tablet  Commonly known as:  CELEXA  TAKE ONE TABLET BY MOUTH  ONCE DAILY     cloNIDine 0.2 MG tablet  Commonly known as:  CATAPRES  TAKE ONE TABLET BY MOUTH TWICE DAILY     diazepam 5 MG tablet  Commonly known as:  VALIUM  TAKE ONE TABLET BY MOUTH TWICE DAILY AS NEEDED FOR  ANXIETY     doxazosin 8 MG tablet  Commonly known as:  CARDURA  Take 1 tablet (8 mg total) by mouth daily.     doxazosin 8 MG tablet  Commonly known as:  CARDURA  TAKE ONE TABLET BY MOUTH ONCE DAILY     furosemide 20 MG tablet  Commonly known as:  LASIX  Take 1 tablet (20 mg total) by mouth daily.     gabapentin 300 MG capsule  Commonly known as:  NEURONTIN  Take 2 capsules (600 mg total) by mouth at bedtime.     glucose blood test strip  Commonly known as:  FREESTYLE LITE  USE  STRIP TO CHECK BLOOD SUGAR THREE TIMES DAILY DX CODE E11.9     insulin pump Soln  Inject into the skin.     multivitamin with minerals Tabs tablet  Take 1 tablet by mouth daily.     pravastatin 10 MG tablet  Commonly known as:  PRAVACHOL  TAKE ONE TABLET BY MOUTH ONCE DAILY AT BEDTIME     V-GO 20 Kit  Use one per day     valsartan 160 MG tablet  Commonly known as:  DIOVAN  Take 160 mg by mouth daily.     Vitamin D (Ergocalciferol) 50000 UNITS Caps capsule  Commonly known as:  DRISDOL  Take 1 capsule (50,000 Units total) by mouth every 7 (seven) days.     ZENPEP 10000 UNITS Cpep  Generic drug:  Pancrelipase (Lip-Prot-Amyl)  TAKE 1 CAPSULE BY MOUTH THREE TIMES A DAY BEFORE MEALS        Allergies:  Allergies  Allergen Reactions  . Enalapril Maleate Cough    Past Medical History  Diagnosis Date  . Type 2 diabetes mellitus ([place])   . Essential hypertension, benign   . Prostate cancer ([place]) 2011  . Mixed hyperlipidemia   . Chronic back pain     Disc disease  . Pancreatic insufficiency ([facility])     Diagnosed at [facility]  . Diabetes mellitus   . Coronary atherosclerosis of native coronary artery  Coronary calcifications by chest CT, Myoview demonstrating inferolateral  scar  . Diastolic dysfunction 11/9673    Grade 1.  . Hyperkalemia   . Kidney stones   . Chronic kidney disease   . Subdural hematoma ([place])   . Arthritis     lower back  . Shortness of breath dyspnea     occasional - liver pressing on right lung, decreased capacity  . Heart murmur     in past  . HOH (hard of hearing)   . Wears hearing aid     "crossover" form right to left  . Deafness in right ear     Past Surgical History  Procedure Laterality Date  . Prostatectomy  2011  . Hernia repair    . Appendectomy    . Cholecystectomy    . Bilateral hip replacement    . Cataract extraction w/phaco Left 05/15/2015    Procedure: CATARACT EXTRACTION PHACO AND INTRAOCULAR LENS PLACEMENT (IOC);  Surgeon: Ronnell Freshwater, MD;  Location: [facility];  Service: Ophthalmology;  Laterality: Left;  DIABETIC - insulin pump and oral meds    Family History  Problem Relation Age of Onset  . Heart attack Father   . Diabetes type II Mother     Social History:  reports that he has never smoked. He has never used smokeless tobacco. He reports that he does not drink alcohol or use illicit drugs.    Review of Systems    Lipid history: On 62m Pravastatin from PCP for treatment    Lab Results  Component Value Date   CHOL 112 12/12/2014   HDL 37* 12/12/2014   LDLCALC 48 12/12/2014   TRIG 136 12/12/2014   CHOLHDL 3.0 12/12/2014            Most recent eye exam was in 1/16, apparently no retinopathy   Hypertension: , treated with carvedilol, clonidine, valsartan 160 mg and doxazosin.      He is taking gabapentin for his muscle cramps and not neuropathy  Endocrine: Hypogonadism has been present for at least a few years but his urologist has not wanted to treat him because of history of prostate cancer treated surgically several years ago However he complains of fatigue   Lab Results  Component Value Date   TESTOSTERONE 255* 02/28/2015     Physical Examination:  Not  indicated   ASSESSMENT:  Diabetes type 2, uncontrolled    He has had difficult to control diabetes with persistently high A1c in the past See history of present illness for detailed discussion of his current management, blood sugar patterns and problems identified  He continues to do fairly well overall with his V-go pump compared to NPH and regular or premixed insulin; also is trying to be better with adjusting his boluses and be better compliant with doing the boluses in the evenings when he is eating out frequently However he is tending to get low sugars midday and early afternoon when he is more active and is sometimes late for meals Checking blood sugars mostly in the morning and later at night and not as much as needed after breakfast or lunch Subjectively also he is liking the V-go pump and has no technical problems with using Has only mild hypoglycemia mostly related to activity or overestimating boluses for what he is eating Fasting readings are fairly good overall especially recently  His A1c is still 7.9 and not clear if this will continue to improve with better blood sugars  HYPOGONADISM: This  has been evaluated by PCP and urologist and currently treatment has been on hold because of his history of prostate cancer but he tells are not available..  Discussed PREVNAR and this was given today  PLAN:   Continue the 20 units basal with the V-go pump  Reduce bolus at breakfast by 2 units especially if planning to be active  Adjust bolus dose based on meal size, probably needs one more click at suppertime when eating out unless eating low fat meals  Try to take bolus 15-20 minutes before eating consistently  More readings after breakfast  Consider consultation with dietitian  Counseling time on subjects discussed above is over 50% of today's 25 minute visit   Damaree Sargent 07/17/2015, 3:55 PM   Note: This office note was prepared with Dragon voice recognition system  technology. Any transcriptional errors that result from this process are unintentional.

## 2015-08-09 ENCOUNTER — Encounter: Payer: Self-pay | Admitting: Family Medicine

## 2015-08-09 ENCOUNTER — Other Ambulatory Visit: Payer: Self-pay | Admitting: Family Medicine

## 2015-08-10 MED ORDER — VALSARTAN 160 MG PO TABS: 160.0000 mg | ORAL_TABLET | Freq: Every day | ORAL | Status: DC

## 2015-08-10 NOTE — Telephone Encounter (Signed)
LRF Diazepam 05/17/15 #60  LOV 07/11/15  OK refill?

## 2015-08-10 NOTE — Telephone Encounter (Signed)
Medication called/sent to requested pharmacy and pt aware via mychart

## 2015-08-10 NOTE — Telephone Encounter (Signed)
ok 

## 2015-08-10 NOTE — Telephone Encounter (Signed)
Medication refilled per protocol. 

## 2015-08-14 ENCOUNTER — Other Ambulatory Visit: Payer: Self-pay | Admitting: Family Medicine

## 2015-08-14 NOTE — Telephone Encounter (Signed)
Medication refilled per protocol. 

## 2015-08-21 ENCOUNTER — Encounter: Payer: Self-pay | Admitting: Ophthalmology

## 2015-08-21 NOTE — Addendum Note (Signed)
Addendum  created 08/21/15 1333 by Mayme Genta, CRNA   Modules edited: Anesthesia Events

## 2015-08-22 DIAGNOSIS — H2511 Age-related nuclear cataract, right eye: Secondary | ICD-10-CM | POA: Diagnosis not present

## 2015-08-23 ENCOUNTER — Encounter: Payer: Self-pay | Admitting: *Deleted

## 2015-08-24 NOTE — Discharge Instructions (Signed)

## 2015-08-28 ENCOUNTER — Ambulatory Visit
Admission: RE | Admit: 2015-08-28 | Discharge: 2015-08-28 | Disposition: A | Discharge status: Home / Self Care | Payer: PPO | Source: Ambulatory Visit | Attending: Ophthalmology | Admitting: Ophthalmology

## 2015-08-28 ENCOUNTER — Ambulatory Visit: Payer: PPO | Admitting: Anesthesiology

## 2015-08-28 ENCOUNTER — Encounter
Admission: RE | Disposition: A | Discharge status: Home / Self Care | Payer: Self-pay | Source: Ambulatory Visit | Attending: Ophthalmology

## 2015-08-28 DIAGNOSIS — E119 Type 2 diabetes mellitus without complications: Secondary | ICD-10-CM | POA: Diagnosis not present

## 2015-08-28 DIAGNOSIS — Z85828 Personal history of other malignant neoplasm of skin: Secondary | ICD-10-CM | POA: Diagnosis not present

## 2015-08-28 DIAGNOSIS — Z9842 Cataract extraction status, left eye: Secondary | ICD-10-CM | POA: Diagnosis not present

## 2015-08-28 DIAGNOSIS — Z9049 Acquired absence of other specified parts of digestive tract: Secondary | ICD-10-CM | POA: Diagnosis not present

## 2015-08-28 DIAGNOSIS — H9193 Unspecified hearing loss, bilateral: Secondary | ICD-10-CM | POA: Diagnosis not present

## 2015-08-28 DIAGNOSIS — Z888 Allergy status to other drugs, medicaments and biological substances status: Secondary | ICD-10-CM | POA: Diagnosis not present

## 2015-08-28 DIAGNOSIS — I10 Essential (primary) hypertension: Secondary | ICD-10-CM | POA: Insufficient documentation

## 2015-08-28 DIAGNOSIS — R011 Cardiac murmur, unspecified: Secondary | ICD-10-CM | POA: Diagnosis not present

## 2015-08-28 DIAGNOSIS — Z8546 Personal history of malignant neoplasm of prostate: Secondary | ICD-10-CM | POA: Insufficient documentation

## 2015-08-28 DIAGNOSIS — H2511 Age-related nuclear cataract, right eye: Secondary | ICD-10-CM | POA: Diagnosis not present

## 2015-08-28 DIAGNOSIS — M7989 Other specified soft tissue disorders: Secondary | ICD-10-CM | POA: Diagnosis not present

## 2015-08-28 DIAGNOSIS — R252 Cramp and spasm: Secondary | ICD-10-CM | POA: Insufficient documentation

## 2015-08-28 DIAGNOSIS — R0602 Shortness of breath: Secondary | ICD-10-CM | POA: Diagnosis not present

## 2015-08-28 DIAGNOSIS — F419 Anxiety disorder, unspecified: Secondary | ICD-10-CM | POA: Insufficient documentation

## 2015-08-28 DIAGNOSIS — K8689 Other specified diseases of pancreas: Secondary | ICD-10-CM | POA: Insufficient documentation

## 2015-08-28 DIAGNOSIS — Z96643 Presence of artificial hip joint, bilateral: Secondary | ICD-10-CM | POA: Diagnosis not present

## 2015-08-28 DIAGNOSIS — E7800 Pure hypercholesterolemia, unspecified: Secondary | ICD-10-CM | POA: Diagnosis not present

## 2015-08-28 DIAGNOSIS — M1990 Unspecified osteoarthritis, unspecified site: Secondary | ICD-10-CM | POA: Insufficient documentation

## 2015-08-28 HISTORY — PX: CATARACT EXTRACTION W/PHACO: SHX586

## 2015-08-28 LAB — GLUCOSE, CAPILLARY
GLUCOSE-CAPILLARY: 97 mg/dL (ref 65–99)
GLUCOSE-CAPILLARY: 95 mg/dL (ref 65–99)

## 2015-08-28 SURGERY — PHACOEMULSIFICATION, CATARACT, WITH IOL INSERTION
Anesthesia: Monitor Anesthesia Care | Laterality: Right | Wound class: Clean

## 2015-08-28 MED ORDER — MIDAZOLAM HCL 2 MG/2ML IJ SOLN
INTRAMUSCULAR | Status: DC | PRN
  Administered 2015-08-28 (×2): 1 mg via INTRAVENOUS

## 2015-08-28 MED ORDER — NA HYALUR & NA CHOND-NA HYALUR 0.4-0.35 ML IO KIT
PACK | INTRAOCULAR | Status: DC | PRN
  Administered 2015-08-28: 1 mL via INTRAOCULAR

## 2015-08-28 MED ORDER — TETRACAINE HCL 0.5 % OP SOLN
1.0000 [drp] | OPHTHALMIC | Status: DC | PRN
  Administered 2015-08-28: 1 [drp] via OPHTHALMIC

## 2015-08-28 MED ORDER — TIMOLOL MALEATE 0.5 % OP SOLN
OPHTHALMIC | Status: DC | PRN
  Administered 2015-08-28: 1 [drp] via OPHTHALMIC

## 2015-08-28 MED ORDER — BRIMONIDINE TARTRATE 0.2 % OP SOLN
OPHTHALMIC | Status: DC | PRN
  Administered 2015-08-28: 1 [drp] via OPHTHALMIC

## 2015-08-28 MED ORDER — EPINEPHRINE HCL 1 MG/ML IJ SOLN
INTRAOCULAR | Status: DC | PRN
  Administered 2015-08-28: 70 mL via OPHTHALMIC

## 2015-08-28 MED ORDER — ARMC OPHTHALMIC DILATING GEL
1.0000 "application " | OPHTHALMIC | Status: DC | PRN
  Administered 2015-08-28 (×2): 1 via OPHTHALMIC

## 2015-08-28 MED ORDER — POVIDONE-IODINE 5 % OP SOLN
1.0000 "application " | OPHTHALMIC | Status: DC | PRN
  Administered 2015-08-28: 1 via OPHTHALMIC

## 2015-08-28 MED ORDER — ACETAMINOPHEN 325 MG PO TABS: 325.0000 mg | ORAL_TABLET | ORAL | Status: DC | PRN

## 2015-08-28 MED ORDER — BALANCED SALT IO SOLN
INTRAOCULAR | Status: DC | PRN
  Administered 2015-08-28: 1 mL via OPHTHALMIC

## 2015-08-28 MED ORDER — FENTANYL CITRATE (PF) 100 MCG/2ML IJ SOLN
INTRAMUSCULAR | Status: DC | PRN
  Administered 2015-08-28: 50 ug via INTRAVENOUS

## 2015-08-28 MED ORDER — ACETAMINOPHEN 160 MG/5ML PO SOLN: 325.0000 mg | ORAL | Status: DC | PRN

## 2015-08-28 MED ORDER — CEFUROXIME OPHTHALMIC INJECTION 1 MG/0.1 ML
INJECTION | OPHTHALMIC | Status: DC | PRN
  Administered 2015-08-28: 0.1 mL via INTRACAMERAL

## 2015-08-28 SURGICAL SUPPLY — 29 items
APPLICATOR COTTON TIP 3IN (MISCELLANEOUS) ×3 IMPLANT
CANNULA ANT/CHMB 27GA (MISCELLANEOUS) ×3 IMPLANT
DISSECTOR HYDRO NUCLEUS 50X22 (MISCELLANEOUS) ×3 IMPLANT
GLOVE BIO SURGEON STRL SZ7 (GLOVE) ×3 IMPLANT
GLOVE SURG LX 6.5 MICRO (GLOVE) ×2
GLOVE SURG LX STRL 6.5 MICRO (GLOVE) ×1 IMPLANT
GOWN STRL REUS W/ TWL LRG LVL3 (GOWN DISPOSABLE) ×2 IMPLANT
GOWN STRL REUS W/TWL LRG LVL3 (GOWN DISPOSABLE) ×4
LENS IOL ACRSF MP 22.0 (Intraocular Lens) ×1 IMPLANT
LENS IOL ACRYSOF POST 22.0 (Intraocular Lens) ×3 IMPLANT
MARKER SKIN SURG W/RULER VIO (MISCELLANEOUS) ×3 IMPLANT
NEEDLE FILTER BLUNT 18X 1/2SAF (NEEDLE) ×6
NEEDLE FILTER BLUNT 18X1 1/2 (NEEDLE) ×3 IMPLANT
PACK CATARACT BRASINGTON (MISCELLANEOUS) ×3 IMPLANT
PACK EYE AFTER SURG (MISCELLANEOUS) ×3 IMPLANT
PACK OPTHALMIC (MISCELLANEOUS) ×3 IMPLANT
RING MALYGIN 7.0 (MISCELLANEOUS) ×3 IMPLANT
SOL BAL SALT 15ML (MISCELLANEOUS)
SOLUTION BAL SALT 15ML (MISCELLANEOUS) IMPLANT
SUT ETHILON 10-0 CS-B-6CS-B-6 (SUTURE)
SUT VICRYL  9 0 (SUTURE)
SUT VICRYL 9 0 (SUTURE) IMPLANT
SUTURE EHLN 10-0 CS-B-6CS-B-6 (SUTURE) IMPLANT
SYR 3ML LL SCALE MARK (SYRINGE) ×9 IMPLANT
SYR TB 1ML LUER SLIP (SYRINGE) ×3 IMPLANT
WATER STERILE IRR 250ML POUR (IV SOLUTION) ×3 IMPLANT
WATER STERILE IRR 500ML POUR (IV SOLUTION) IMPLANT
WICK EYE OCUCEL (MISCELLANEOUS) IMPLANT
WIPE NON LINTING 3.25X3.25 (MISCELLANEOUS) ×3 IMPLANT

## 2015-08-28 NOTE — Anesthesia Preprocedure Evaluation (Signed)
Anesthesia Evaluation  Patient identified by MRN, date of birth, ID band  Reviewed: NPO status   History of Anesthesia Complications Negative for: history of anesthetic complications  Airway Mallampati: II  TM Distance: >3 FB Neck ROM: full   Comment: [place] no notable dental hx.    Pulmonary neg pulmonary ROS,    Pulmonary exam normal        Cardiovascular Exercise Tolerance: Good hypertension, (-) CAD (denies) Normal cardiovascular exam     Neuro/Psych Anxiety HOH  negative psych ROS   GI/Hepatic Neg liver ROS, Pancreas dysfunction   Endo/Other  diabetes  Renal/GU   negative genitourinary   Musculoskeletal  (+) Arthritis , Leg cramps    Abdominal   Peds  Hematology negative hematology ROS (+) Prost cancer 2012    Anesthesia Other Findings   Reproductive/Obstetrics                             Anesthesia Physical  Anesthesia Plan  ASA: III  Anesthesia Plan: MAC   Post-op Pain Management:    Induction:   Airway Management Planned:   Additional Equipment:   Intra-op Plan:   Post-operative Plan:   Informed Consent: I have reviewed the patients History and Physical, chart, labs and discussed the procedure including the risks, benefits and alternatives for the proposed anesthesia with the patient or authorized representative who has indicated his/her understanding and acceptance.     Plan Discussed with: CRNA  Anesthesia Plan Comments:         Anesthesia Quick Evaluation

## 2015-08-28 NOTE — Op Note (Signed)
Date of Surgery: 08/28/2015  PREOPERATIVE DIAGNOSES:  1. Visually significant nuclear sclerotic cataract, right eye. 2. Dense posterior synechiae, right eye.  POSTOPERATIVE DIAGNOSES: Same  PROCEDURES PERFORMED: Cataract extraction with intraocular lens implant with help of a Malyugin ring, right eye.  SURGEON: Almon Hercules, M.D.  ANESTHESIA: MAC and topical  IMPLANTS: [id number] +22.0 D   Implant Name Type Inv. Item Serial No. Manufacturer Lot No. LRB No. Used  LENS IOL POST 22.0 - [id number] Intraocular Lens LENS IOL POST 22.0 [id number] ALCON   Right 1     COMPLICATIONS: Anterior capsular rent temporally not extending to posterior capsule, IOL placed in sulcus with optic capture.  DESCRIPTION OF PROCEDURE: Therapeutic options were discussed with the patient preoperatively, including a discussion of risks and benefits of surgery. Informed consent was obtained. An IOL-Master and immersion biometry were used to take the lens measurements, and a dilated fundus exam was performed within 6 months of the surgical date.  The patient was premedicated and brought to the operating room and placed on the operating table in the supine position. After adequate anesthesia, the patient was prepped and draped in the usual sterile ophthalmic fashion. A wire lid speculum was inserted and the microscope was positioned. A Superblade was used to create a paracentesis site at the limbus and a small amount of dilute preservative free lidocaine was instilled into the anterior chamber, followed by dispersive viscoelastic.  A clear corneal incision was created temporally using a 2.4 mm keratome blade. The dense posterior synechiae were broken with a [place] and then the Malyugin ring was placed. Capsulorrhexis was then performed. In situ phacoemulsification was performed.  Cortical material was removed with the irrigation-aspiration unit. Dispersive viscoelastic was instilled to open the capsular bag. An anterior  capsular rent limited to the anterior capsule only was noted. A posterior chamber intraocular lens with the specifications above was inserted and positioned with haptics in the sulcus, optic captured in the capsulorrhexis. Malyugin ring was removed.  Irrigation-aspiration was used to remove all viscoelastic. Cefuroxime 1cc was instilled into the anterior chamber, and the corneal incision was checked and found to be water tight. The eyelid speculum was removed.  The operative eye was covered with protective goggles after instilling 1 drop of timolol and brimonidine. The patient tolerated the procedure well.

## 2015-08-28 NOTE — Transfer of Care (Signed)
Immediate Anesthesia Transfer of Care Note  Patient: Mark Jumbo Sr.  Procedure(s) Performed: Procedure(s) with comments: CATARACT EXTRACTION PHACO AND INTRAOCULAR LENS PLACEMENT (IOC) (Right) - DIABETIC PER PT AND CINDY PLEASE KEEP ARRIVAL TIME AFTER 8AM   Patient Location: PACU  Anesthesia Type: MAC  Level of Consciousness: awake, alert  and patient cooperative  Airway and Oxygen Therapy: Patient Spontanous Breathing and Patient connected to supplemental oxygen  Post-op Assessment: Post-op Vital signs reviewed, Patient's Cardiovascular Status Stable, Respiratory Function Stable, Patent Airway and No signs of Nausea or vomiting  Post-op Vital Signs: Reviewed and stable  Complications: No apparent anesthesia complications

## 2015-08-28 NOTE — Anesthesia Postprocedure Evaluation (Signed)
Anesthesia Post Note  Patient: Mark Jumbo Sr.  Procedure(s) Performed: Procedure(s) (LRB): CATARACT EXTRACTION PHACO AND INTRAOCULAR LENS PLACEMENT (IOC) (Right)  Patient location during evaluation: PACU Anesthesia Type: MAC Level of consciousness: awake and alert Pain management: pain level controlled Vital Signs Assessment: post-procedure vital signs reviewed and stable Respiratory status: spontaneous breathing, nonlabored ventilation, respiratory function stable and patient connected to nasal cannula oxygen Cardiovascular status: blood pressure returned to baseline and stable Postop Assessment: no signs of nausea or vomiting Anesthetic complications: no    Amaryllis Dyke

## 2015-08-28 NOTE — H&P (Signed)
H+P reviewed and is up to date, please see paper chart.

## 2015-08-28 NOTE — Anesthesia Procedure Notes (Signed)
Procedure Name: MAC Performed by: Zeenat Jeanbaptiste Pre-anesthesia Checklist: Patient identified, Emergency Drugs available, Suction available, Timeout performed and Patient being monitored Patient Re-evaluated:Patient Re-evaluated prior to inductionOxygen Delivery Method: Nasal cannula Placement Confirmation: positive ETCO2       

## 2015-08-29 ENCOUNTER — Encounter: Payer: Self-pay | Admitting: Ophthalmology

## 2015-09-05 ENCOUNTER — Other Ambulatory Visit: Payer: Self-pay | Admitting: Endocrinology

## 2015-09-29 ENCOUNTER — Other Ambulatory Visit: Payer: Self-pay | Admitting: Family Medicine

## 2015-10-06 DIAGNOSIS — Z961 Presence of intraocular lens: Secondary | ICD-10-CM | POA: Diagnosis not present

## 2015-10-16 ENCOUNTER — Encounter: Payer: Self-pay | Admitting: Endocrinology

## 2015-10-16 ENCOUNTER — Ambulatory Visit (INDEPENDENT_AMBULATORY_CARE_PROVIDER_SITE_OTHER): Payer: PPO | Admitting: Endocrinology

## 2015-10-16 VITALS — BP 126/72 | HR 70 | Temp 97.6°F | Resp 16 | Ht 69.0 in | Wt 223.0 lb

## 2015-10-16 DIAGNOSIS — Z794 Long term (current) use of insulin: Secondary | ICD-10-CM | POA: Diagnosis not present

## 2015-10-16 DIAGNOSIS — E119 Type 2 diabetes mellitus without complications: Secondary | ICD-10-CM

## 2015-10-16 DIAGNOSIS — E1165 Type 2 diabetes mellitus with hyperglycemia: Secondary | ICD-10-CM | POA: Diagnosis not present

## 2015-10-16 LAB — POCT GLYCOSYLATED HEMOGLOBIN (HGB A1C): HEMOGLOBIN A1C: 7.9

## 2015-10-16 NOTE — Progress Notes (Signed)
Patient ID: Mark Hose., male   DOB: May 25, 1941, 74 y.o.   MRN: [id number]           Reason for Appointment: Follow-up for Type 2 Diabetes  Referring physician: Pickard  History of Present Illness:          Date of diagnosis of type 2 diabetes mellitus :        Background history:  The first few years of his diabetes he was treated with Metformin At some point he was also given Actos by his PCP for control However because of worsening control he was started on insulin probably Lantus in 2005 and subsequently 70/30 insulin  Recent history:  INSULIN regimen :  V-Go 20, boluses 6 units before meals    He was referred here because of persistently high A1c of 7.8 in 5/16 He was taking 45 units a day of premixed insulin in 2 divided doses  Because of poor control and variability he was started on the V-go pump with the 20 unit of basal on 05/10/15  He has been able to use this without any problems and he likes the convenience of this However his A1c is still seems to be relatively high at 7.9  Current blood sugar patterns and problems identified:  His fasting blood sugars have been fairly good although relatively lower in the last week, lowest reading 64 with her symptoms and no overnight hypoglycemia  He has not checked many readings after breakfast or lunch   He now says that he may feel hypoglycemic before lunch especially if he is more active and also if he is late for lunch  He may not always test his blood sugar with symptoms that his lowest glucose is 46 at about 3:30 PM  He is taking the same amount of bolus despite variability in his meal intake and takes the same for all meals; occasionally will take a next or click when eating out  Highest blood sugars are this week after supper when he may not take his boluses before eating partly from per of hypoglycemia  Blood sugars at bedtime are quite variable with standard deviation of 70  He has not checked readings at  suppertime but once had a glucose of 276 at about 6 PM  Oral hypoglycemic drugs the patient is taking are: Actos 30 mg daily      Side effects from medications have been: Some swelling from Actos treated with Lasix  Compliance with the medical regimen: Fair, he thinks he can do better on diet Hypoglycemia: Symptoms are as follows: Sees lights flickering, off balance   Glucose monitoring:  done 2 times a day         Glucometer: Lamar Sprinkles    Blood Glucose readings by time of day and averages from download recently  Mean values apply above for all meters except median for One Touch  PRE-MEAL Fasting Lunch Dinner Bedtime Overall  Glucose range: 64-211  77, 91   80-339  46-339   Mean/median: 118    166 140+/-58     Self-care: The diet that the patient has been following is: tries to limit sweets.     Meal times: Breakfast: 9 AM.  Lunch: 1-2 pm, Dinner: 5 pm. Eating out 3/7 days a week in the evenings   Typical meal intake: Breakfast usually Eggs, bacon and egg toast, today cereal .  Lunch is a sandwich sometimes, usually a snack.  Dinner is meat and vegetables, has peanut butter  snacks periodically at lunch or bedtime  Dietician visit, most recent: several years ago               Exercise: active in workshop, walking some.   Weight history:   Wt Readings from Last 3 Encounters:  10/16/15 223 lb (101.152 kg)  08/28/15 223 lb (101.152 kg)  07/17/15 224 lb 6.4 oz (101.787 kg)    Glycemic control:   Lab Results  Component Value Date   HGBA1C 7.9 10/16/2015   HGBA1C 7.9 07/17/2015   HGBA1C 8.0 03/06/2015   Lab Results  Component Value Date   MICROALBUR 1.0 12/13/2014   LDLCALC 48 12/12/2014   CREATININE 1.22* 06/12/2015         Medication List       This list is accurate as of: 10/16/15  9:37 PM.  Always use your most recent med list.               aspirin EC 81 MG tablet  Take 81 mg by mouth at bedtime.     carvedilol 25 MG tablet  Commonly known as:  COREG    TAKE ONE TABLET BY MOUTH TWICE DAILY     citalopram 20 MG tablet  Commonly known as:  CELEXA  TAKE ONE TABLET BY MOUTH ONCE DAILY     cloNIDine 0.2 MG tablet  Commonly known as:  CATAPRES  TAKE ONE TABLET BY MOUTH TWICE DAILY     diazepam 5 MG tablet  Commonly known as:  VALIUM  TAKE ONE TABLET BY MOUTH TWICE DAILY AS NEEDED     doxazosin 8 MG tablet  Commonly known as:  CARDURA  Take 1 tablet (8 mg total) by mouth daily.     furosemide 20 MG tablet  Commonly known as:  LASIX  Take 1 tablet (20 mg total) by mouth daily.     gabapentin 300 MG capsule  Commonly known as:  NEURONTIN  Take 2 capsules (600 mg total) by mouth at bedtime.     glucose blood test strip  Commonly known as:  FREESTYLE LITE  USE  STRIP TO CHECK BLOOD SUGAR THREE TIMES DAILY DX CODE E11.9     insulin pump Soln  Inject into the skin.     multivitamin with minerals Tabs tablet  Take 1 tablet by mouth daily.     pravastatin 10 MG tablet  Commonly known as:  PRAVACHOL  TAKE ONE TABLET BY MOUTH ONCE DAILY AT BEDTIME     V-GO 20 Kit  USE ONE PER DAY     valsartan 160 MG tablet  Commonly known as:  DIOVAN  Take 1 tablet (160 mg total) by mouth daily.     Vitamin D (Ergocalciferol) 50000 units Caps capsule  Commonly known as:  DRISDOL  Take 1 capsule (50,000 Units total) by mouth every 7 (seven) days.     ZENPEP 10000 units Cpep  Generic drug:  Pancrelipase (Lip-Prot-Amyl)  TAKE 1 CAPSULE BY MOUTH THREE TIMES A DAY BEFORE MEALS        Allergies:  Allergies  Allergen Reactions  . Enalapril Maleate Cough    Past Medical History  Diagnosis Date  . Type 2 diabetes mellitus ([place])   . Essential hypertension, benign   . Prostate cancer ([place]) 2011  . Mixed hyperlipidemia   . Chronic back pain     Disc disease  . Pancreatic insufficiency ([facility])     Diagnosed at [facility]  . Diabetes mellitus   . Coronary atherosclerosis  of native coronary artery     Coronary calcifications by chest CT,  Myoview demonstrating inferolateral scar  . Diastolic dysfunction 08/256    Grade 1.  . Hyperkalemia   . Kidney stones   . Chronic kidney disease   . Subdural hematoma ([place])   . Arthritis     lower back  . Shortness of breath dyspnea     occasional - liver pressing on right lung, decreased capacity  . Heart murmur     in past  . HOH (hard of hearing)   . Wears hearing aid     "crossover" form right to left  . Deafness in right ear     Past Surgical History  Procedure Laterality Date  . Prostatectomy  2011  . Hernia repair    . Appendectomy    . Cholecystectomy    . Bilateral hip replacement    . Cataract extraction w/phaco Left 05/15/2015    Procedure: CATARACT EXTRACTION PHACO AND INTRAOCULAR LENS PLACEMENT (IOC);  Surgeon: Ronnell Freshwater, MD;  Location: [place];  Service: Ophthalmology;  Laterality: Left;  DIABETIC - insulin pump and oral meds  . Cataract extraction w/phaco Right 08/28/2015    Procedure: CATARACT EXTRACTION PHACO AND INTRAOCULAR LENS PLACEMENT (IOC);  Surgeon: Ronnell Freshwater, MD;  Location: [place];  Service: Ophthalmology;  Laterality: Right;  DIABETIC PER PT AND CINDY PLEASE KEEP ARRIVAL TIME AFTER 8AM     Family History  Problem Relation Age of Onset  . Heart attack Father   . Diabetes type II Mother     Social History:  reports that he has never smoked. He has never used smokeless tobacco. He reports that he does not drink alcohol or use illicit drugs.    Review of Systems    Lipid history: On 61m Pravastatin from PCP for treatment    Lab Results  Component Value Date   CHOL 112 12/12/2014   HDL 37* 12/12/2014   LDLCALC 48 12/12/2014   TRIG 136 12/12/2014   CHOLHDL 3.0 12/12/2014            Most recent eye exam was in 1/16, apparently no retinopathy   Hypertension: , treated with carvedilol, clonidine, valsartan 160 mg and doxazosinWith good control .      He is taking gabapentin for his  muscle cramps and not neuropathy  Endocrine: Hypogonadism has been present for at least a few years but his urologist has not wanted to treat him because of history of prostate cancer treated surgically several years ago   Lab Results  Component Value Date   TESTOSTERONE 255* 02/28/2015     Physical Examination:  Not indicated   ASSESSMENT:  Diabetes type 2, uncontrolled    He has had difficult to control diabetes with persistently high A1c in the past See history of present illness for detailed discussion of his current management, blood sugar patterns and problems identified   Although he is experiencing fairly good blood sugars at home most of the time with the V-go pump his A1c has not come down much, still at 7.9 This may be related to postprandial hyperglycemia blood sugars after supper are averaging only about 165  His most challenging issue is tendency to hypoglycemia during the day when he is more active and if he is late for lunch Not clear what his postprandial readings are after breakfast and not checking after meals especially 2 hours later FASTING blood sugars are excellent although slightly lower this  week  HYPERTENSION: Appears well-controlled    PLAN:   Continue the 20 units basal with the V-go pump  Reduce bolus at breakfast down to only 2 units  May take 2-4 units at lunch based on meal size  Midmorning snack consistently  More readings in between meals and afternoon.  Check readings 2 hours after supper instead of bedtime  Do not skip the bolus at suppertime  Call if still having low sugars in the afternoon frequently  May consider basal bolus insulin regimen if still has tendency to have low sugars during the day  He will call if he has no improvement in blood sugars and will switch his insulin, discussed that basal bolus insulin regimen may be more expensive   Counseling time on subjects discussed above is over 50% of today's 25 minute  visit   Zenith Lamphier 10/16/2015, 9:37 PM   Note: This office note was prepared with Dragon voice recognition system technology. Any transcriptional errors that result from this process are unintentional.

## 2015-10-16 NOTE — Patient Instructions (Addendum)
Click only once for breakfast and 2x at lunch based on type of meal  Eat on time  Snack mid morning  More sugars in late morning  Check blood sugars on waking up 3-4  times a week Also check blood sugars about 2 hours after a meal and do this after different meals by rotation  Recommended blood sugar levels on waking up is 90-130 and about 2 hours after meal is 130-160  Please bring your blood sugar monitor to each visit, thank you

## 2015-11-06 DIAGNOSIS — M4810 Ankylosing hyperostosis [Forestier], site unspecified: Secondary | ICD-10-CM | POA: Diagnosis not present

## 2015-11-06 DIAGNOSIS — Z96643 Presence of artificial hip joint, bilateral: Secondary | ICD-10-CM | POA: Diagnosis not present

## 2015-11-06 DIAGNOSIS — M545 Low back pain: Secondary | ICD-10-CM | POA: Diagnosis not present

## 2015-11-07 ENCOUNTER — Ambulatory Visit: Payer: Self-pay | Admitting: Family Medicine

## 2015-11-08 ENCOUNTER — Encounter: Payer: Self-pay | Admitting: Family Medicine

## 2015-11-08 ENCOUNTER — Other Ambulatory Visit: Payer: Self-pay | Admitting: Family Medicine

## 2015-11-08 NOTE — Telephone Encounter (Signed)
?   OK to Refill  

## 2015-11-09 NOTE — Telephone Encounter (Signed)
Medication called to pharmacy. 

## 2015-11-09 NOTE — Telephone Encounter (Signed)
ok 

## 2015-11-27 ENCOUNTER — Encounter: Payer: Self-pay | Admitting: Endocrinology

## 2015-11-27 ENCOUNTER — Ambulatory Visit (INDEPENDENT_AMBULATORY_CARE_PROVIDER_SITE_OTHER): Payer: PPO | Admitting: Endocrinology

## 2015-11-27 VITALS — BP 130/78 | HR 68 | Temp 97.8°F | Resp 14 | Ht 69.0 in | Wt 221.6 lb

## 2015-11-27 DIAGNOSIS — Z794 Long term (current) use of insulin: Secondary | ICD-10-CM | POA: Diagnosis not present

## 2015-11-27 DIAGNOSIS — E1165 Type 2 diabetes mellitus with hyperglycemia: Secondary | ICD-10-CM

## 2015-11-27 NOTE — Progress Notes (Signed)
Patient ID: [name]., male   DOB: 12-26-1940, 75 y.o.   MRN: [id number]           Reason for Appointment: Follow-up for Type 2 Diabetes  Referring physician: Pickard  History of Present Illness:          Date of diagnosis of type 2 diabetes mellitus :        Background history:  The first few years of his diabetes he was treated with Metformin At some point he was also given Actos by his PCP for control However because of worsening control he was started on insulin probably Lantus in 2005 and subsequently 70/30 insulin  Recent history:  INSULIN regimen :  V-Go 20, boluses, 2 units, acb, 2-4 acl;  6 units before supper   He was referred here because of persistently high A1c of 7.8 in 5/16 He was taking 45 units a day of premixed insulin in 2 divided doses  Because of poor control and variability he was started on the V-go pump with the 20 unit of basal on 05/10/15  He has been able to use this without any problems and he likes the convenience of this  His last A1c was however 7.9 He is here for short-term follow-up because of tendency to hypoglycemia on his last visit He is also asking about stopping the V-go pump because of going into the donut hole soon  Current blood sugar patterns and problems identified:  His fasting blood sugars have been more variable and overall higher without hypoglycemia  Although he was more active and tending to have low blood sugars during the day he now is not consistently active and his blood sugars are consistently high at lunchtime especially with reducing his bolus to 2 units in the morning  He is still having significant amount of carbohydrate in the morning usually but does get some protein  His lunch intake is quite variable but blood sugars are usually not checked in the afternoon  Blood sugars at bedtime are quite variable with no consistent pattern  Although he does try to take his insulin with every meal regardless of  pre-meal blood sugar he may bolus right before eating usually  He will also take 2-4 units bolus for blood sugars at night with his snack which can be either peanut butter crackers or sugar-free ice cream  Oral hypoglycemic drugs the patient is taking are: Actos 30 mg daily      Side effects from medications have been: Some swelling from Actos treated with Lasix  Compliance with the medical regimen: Fair, he thinks he can do better on diet Hypoglycemia: Symptoms are as follows: Sees lights flickering, off balance   Glucose monitoring:  done 2 times a day         Glucometer: Lamar Sprinkles    Blood Glucose readings by time of day and averages from download recently  Mean values apply above for all meters except median for One Touch  PRE-MEAL Fasting Lunch Dinner Bedtime Overall  Glucose range: 94-201  193-273  165, 214  68-300    Mean/median: 150   190  175    Self-care: The diet that the patient has been following is: tries to limit sweets.     Meal times: Breakfast: 9 AM.  Lunch: 1-2 pm, Dinner: 5 pm. Eating out 3/7 days a week in the evenings   Typical meal intake: Breakfast usually Eggs, sausage and egg toast. .  Lunch is a sandwich sometimes,  usually a snack.  Dinner is meat and vegetables, has peanut butter snacks periodically at lunch or bedtime  Dietician visit, most recent: several years ago               Exercise: active in workshop, has back pain   Weight history:   Wt Readings from Last 3 Encounters:  11/27/15 221 lb 9.6 oz (100.517 kg)  10/16/15 223 lb (101.152 kg)  08/28/15 223 lb (101.152 kg)    Glycemic control:   Lab Results  Component Value Date   HGBA1C 7.9 10/16/2015   HGBA1C 7.9 07/17/2015   HGBA1C 8.0 03/06/2015   Lab Results  Component Value Date   MICROALBUR 1.0 12/13/2014   LDLCALC 48 12/12/2014   CREATININE 1.22* 06/12/2015         Medication List       This list is accurate as of: 11/27/15 12:07 PM.  Always use your most recent med list.                aspirin EC 81 MG tablet  Take 81 mg by mouth at bedtime.     carvedilol 25 MG tablet  Commonly known as:  COREG  TAKE ONE TABLET BY MOUTH TWICE DAILY     citalopram 20 MG tablet  Commonly known as:  CELEXA  TAKE ONE TABLET BY MOUTH ONCE DAILY     cloNIDine 0.2 MG tablet  Commonly known as:  CATAPRES  TAKE ONE TABLET BY MOUTH TWICE DAILY     diazepam 5 MG tablet  Commonly known as:  VALIUM  TAKE ONE TABLET BY MOUTH TWICE DAILY AS NEEDED     doxazosin 8 MG tablet  Commonly known as:  CARDURA  Take 1 tablet (8 mg total) by mouth daily.     furosemide 20 MG tablet  Commonly known as:  LASIX  Take 1 tablet (20 mg total) by mouth daily.     gabapentin 300 MG capsule  Commonly known as:  NEURONTIN  Take 2 capsules (600 mg total) by mouth at bedtime.     glucose blood test strip  Commonly known as:  FREESTYLE LITE  USE  STRIP TO CHECK BLOOD SUGAR THREE TIMES DAILY DX CODE E11.9     insulin pump Soln  Inject into the skin.     multivitamin with minerals Tabs tablet  Take 1 tablet by mouth daily.     pravastatin 10 MG tablet  Commonly known as:  PRAVACHOL  TAKE ONE TABLET BY MOUTH ONCE DAILY AT BEDTIME     V-GO 20 Kit  USE ONE PER DAY     valsartan 160 MG tablet  Commonly known as:  DIOVAN  Take 1 tablet (160 mg total) by mouth daily.     Vitamin D (Ergocalciferol) 50000 units Caps capsule  Commonly known as:  DRISDOL  Take 1 capsule (50,000 Units total) by mouth every 7 (seven) days.     ZENPEP 10000 units Cpep  Generic drug:  Pancrelipase (Lip-Prot-Amyl)  TAKE 1 CAPSULE BY MOUTH THREE TIMES A DAY BEFORE MEALS        Allergies:  Allergies  Allergen Reactions  . Enalapril Maleate Cough    Past Medical History  Diagnosis Date  . Type 2 diabetes mellitus ([place])   . Essential hypertension, benign   . Prostate cancer ([place]) 2011  . Mixed hyperlipidemia   . Chronic back pain     Disc disease  . Pancreatic insufficiency ([place])     Diagnosed  at [facility]  . Diabetes mellitus   . Coronary atherosclerosis of native coronary artery     Coronary calcifications by chest CT, Myoview demonstrating inferolateral scar  . Diastolic dysfunction 01/2693    Grade 1.  . Hyperkalemia   . Kidney stones   . Chronic kidney disease   . Subdural hematoma ([place])   . Arthritis     lower back  . Shortness of breath dyspnea     occasional - liver pressing on right lung, decreased capacity  . Heart murmur     in past  . HOH (hard of hearing)   . Wears hearing aid     "crossover" form right to left  . Deafness in right ear     Past Surgical History  Procedure Laterality Date  . Prostatectomy  2011  . Hernia repair    . Appendectomy    . Cholecystectomy    . Bilateral hip replacement    . Cataract extraction w/phaco Left 05/15/2015    Procedure: CATARACT EXTRACTION PHACO AND INTRAOCULAR LENS PLACEMENT (IOC);  Surgeon: Ronnell Freshwater, MD;  Location: [place];  Service: Ophthalmology;  Laterality: Left;  DIABETIC - insulin pump and oral meds  . Cataract extraction w/phaco Right 08/28/2015    Procedure: CATARACT EXTRACTION PHACO AND INTRAOCULAR LENS PLACEMENT (IOC);  Surgeon: Ronnell Freshwater, MD;  Location: [place];  Service: Ophthalmology;  Laterality: Right;  DIABETIC PER PT AND CINDY PLEASE KEEP ARRIVAL TIME AFTER 8AM     Family History  Problem Relation Age of Onset  . Heart attack Father   . Diabetes type II Mother     Social History:  reports that he has never smoked. He has never used smokeless tobacco. He reports that he does not drink alcohol or use illicit drugs.    Review of Systems    Lipid history: On 63m Pravastatin from PCP for Cardiovascular prophylaxis    Lab Results  Component Value Date   CHOL 112 12/12/2014   HDL 37* 12/12/2014   LDLCALC 48 12/12/2014   TRIG 136 12/12/2014   CHOLHDL 3.0 12/12/2014            Most recent eye exam was in 1/16, apparently no  retinopathy   Hypertension: , treated with carvedilol25 mg, clonidine0.2 mg, valsartan 160 mg and doxazosinWith good control .      He is taking gabapentin for his muscle cramps and not neuropathy  Endocrine: Hypogonadism has been present for at least a few years but his urologist has not wanted to treat him because of history of prostate cancer treated surgically several years ago  His last level:  Lab Results  Component Value Date   TESTOSTERONE 255* 02/28/2015     Physical Examination:  Not indicated   ASSESSMENT:  Diabetes type 2, uncontrolled    He has had difficult to control diabetes with persistently high A1c in the past See history of present illness for detailed discussion of his current management, blood sugar patterns and problems identified  His blood sugar patterns are different on this visit and not clear why.  He was previously having tendency to hypoglycemia Although he has reduced his insulin coverage for breakfast he probably also is less active now with his back pain Most of his readings after breakfast are close to 200 Not clear why his blood sugars after supper are extremely variable Most likely he has variable diet Also may have some effect of taking regular insulin instruction of Novolog  for his pump This may occasionally impact his fasting readings which are variable  Currently he is wanting to change his regimen to a generic insulin with injections because of high cost of the V-go pump  PLAN:   Since he will be going on the [facility] and Regular Insulin from [employer] will give him tentative doses  He will start keeping a record of his food intake and insulin in her diary that was given  He will review his blood sugar patterns with the new insulin regimen with nurse educator in about a month  More consistent diet and avoid high-fat foods  Take insulin 30 minutes before eating  For now will increase his boluses at breakfast to about 4-6 units instead  of 2 and the same at lunchtime based on his meal size  May reduce morning insulin bolus if blood sugars are low normal or if he is planning to be more active  Counseling time on subjects discussed above is over 50% of today's 25 minute visit  Patient Instructions  Take 2-3 clicks at breakfast and lunch based on activity level  Relion N insulin 14 units before breakfast and 10 at bedtime.    Relion R insulin 6 before Bfst and 8-10 supper, 30 min before eating    KUMAR,AJAY 11/27/2015, 12:07 PM   Note: This office note was prepared with Estate agent. Any transcriptional errors that result from this process are unintentional.

## 2015-11-27 NOTE — Patient Instructions (Signed)
Take 2-3 clicks at breakfast and lunch based on activity level  Relion N insulin 14 units before breakfast and 10 at bedtime.    Relion R insulin 6 before Bfst and 8-10 supper, 30 min before eating

## 2015-12-12 ENCOUNTER — Telehealth: Payer: Self-pay | Admitting: Endocrinology

## 2015-12-12 NOTE — Telephone Encounter (Signed)
Please see below and advise?  I'm not sure what he's talking about

## 2015-12-12 NOTE — Telephone Encounter (Signed)
Yes he can mix R and N insulins, draw R first

## 2015-12-12 NOTE — Telephone Encounter (Signed)
PT called and said that he is starting a new insulin program this week? (I'm assuming pump) He wants to know if it is okay to mix the two insulins together with this.

## 2015-12-13 NOTE — Telephone Encounter (Signed)
Noted, patient is aware.

## 2015-12-16 ENCOUNTER — Other Ambulatory Visit: Payer: Self-pay | Admitting: Family Medicine

## 2015-12-28 ENCOUNTER — Encounter: Payer: Self-pay | Admitting: Family Medicine

## 2015-12-28 ENCOUNTER — Ambulatory Visit (INDEPENDENT_AMBULATORY_CARE_PROVIDER_SITE_OTHER): Payer: PPO | Admitting: Family Medicine

## 2015-12-28 VITALS — BP 144/80 | HR 72 | Temp 98.0°F | Resp 18 | Ht 69.0 in | Wt 218.0 lb

## 2015-12-28 DIAGNOSIS — N183 Chronic kidney disease, stage 3 unspecified: Secondary | ICD-10-CM

## 2015-12-28 DIAGNOSIS — I10 Essential (primary) hypertension: Secondary | ICD-10-CM | POA: Diagnosis not present

## 2015-12-28 LAB — CBC WITH DIFFERENTIAL/PLATELET
BASOS ABS: 0 {cells}/uL (ref 0–200)
Basophils Relative: 0 %
EOS ABS: 285 {cells}/uL (ref 15–500)
EOS PCT: 5 %
HEMATOCRIT: 41.4 % (ref 38.5–50.0)
HEMOGLOBIN: 13 g/dL (ref 13.0–17.0)
LYMPHS ABS: 1539 {cells}/uL (ref 850–3900)
Lymphocytes Relative: 27 %
MCH: 29.9 pg (ref 27.0–33.0)
MCHC: 31.4 g/dL — AB (ref 32.0–36.0)
MCV: 95.2 fL (ref 80.0–100.0)
MPV: 10 fL (ref 7.5–12.5)
Monocytes Absolute: 456 cells/uL (ref 200–950)
Monocytes Relative: 8 %
NEUTROS ABS: 3420 {cells}/uL (ref 1500–7800)
NEUTROS PCT: 60 %
PLATELETS: 170 10*3/uL (ref 140–400)
RBC: 4.35 MIL/uL (ref 4.20–5.80)
RDW: 13.6 % (ref 11.0–15.0)
WBC: 5.7 10*3/uL (ref 3.8–10.8)

## 2015-12-28 LAB — COMPLETE METABOLIC PANEL WITH GFR
ALT: 27 U/L (ref 9–46)
AST: 23 U/L (ref 10–35)
Albumin: 3.6 g/dL (ref 3.6–5.1)
Alkaline Phosphatase: 127 U/L — ABNORMAL HIGH (ref 40–115)
BUN: 30 mg/dL — ABNORMAL HIGH (ref 7–25)
CALCIUM: 8.4 mg/dL — AB (ref 8.6–10.3)
CHLORIDE: 104 mmol/L (ref 98–110)
CO2: 25 mmol/L (ref 20–31)
CREATININE: 1.57 mg/dL — AB (ref 0.70–1.18)
GFR, EST AFRICAN AMERICAN: 49 mL/min — AB (ref >=60)
GFR, EST NON AFRICAN AMERICAN: 43 mL/min — AB (ref >=60)
Glucose, Bld: 245 mg/dL — ABNORMAL HIGH (ref 70–99)
Potassium: 4.8 mmol/L (ref 3.5–5.3)
Sodium: 137 mmol/L (ref 135–146)
Total Bilirubin: 0.5 mg/dL (ref 0.2–1.2)
Total Protein: 6.4 g/dL (ref 6.1–8.1)

## 2015-12-28 MED ORDER — GABAPENTIN 300 MG PO CAPS: 600.0000 mg | ORAL_CAPSULE | Freq: Every day | ORAL | Status: DC

## 2015-12-28 MED ORDER — DOXAZOSIN MESYLATE 8 MG PO TABS: 8.0000 mg | ORAL_TABLET | Freq: Every day | ORAL | Status: DC

## 2015-12-28 NOTE — Progress Notes (Signed)
Subjective:    Patient ID: [name]., male    DOB: [birth year], 75 y.o.   MRN: [id number]  HPI Patient's blood pressures have been much better controlled at home recently. He brings in several different numbers over the last week. Blood pressure ranges from 108-144/60-75. All but one blood pressure is less than 120. He does report hypersomnia and dry mouth on clonidine. He is also on another alpha-blocker in [place]. Will maintain this combination due to his difficult to control blood pressure but it seems like his blood pressure has been much better controlled recently. He denies any chest pain shortness of breath or dyspnea on exertion. He denies any peripheral edema. He is overdue for lab work. He follows up regularly with his endocrinologist who is monitoring his sugar control. He is not due for hepatitis C screening and his immunizations are up-to-date. Past Medical History  Diagnosis Date  . Type 2 diabetes mellitus ([place])   . Essential hypertension, benign   . Prostate cancer ([place]) 2011  . Mixed hyperlipidemia   . Chronic back pain     Disc disease  . Pancreatic insufficiency ([facility])     Diagnosed at [facility]  . Diabetes mellitus   . Coronary atherosclerosis of native coronary artery     Coronary calcifications by chest CT, Myoview demonstrating inferolateral scar  . Diastolic dysfunction 10/1515    Grade 1.  . Hyperkalemia   . Kidney stones   . Chronic kidney disease   . Subdural hematoma ([place])   . Arthritis     lower back  . Shortness of breath dyspnea     occasional - liver pressing on right lung, decreased capacity  . Heart murmur     in past  . HOH (hard of hearing)   . Wears hearing aid     "crossover" form right to left  . Deafness in right ear    Past Surgical History  Procedure Laterality Date  . Prostatectomy  2011  . Hernia repair    . Appendectomy    . Cholecystectomy    . Bilateral hip replacement    . Cataract extraction w/phaco Left 05/15/2015    Procedure: CATARACT EXTRACTION PHACO AND INTRAOCULAR LENS PLACEMENT (IOC);  Surgeon: Ronnell Freshwater, MD;  Location: [employer];  Service: Ophthalmology;  Laterality: Left;  DIABETIC - insulin pump and oral meds  . Cataract extraction w/phaco Right 08/28/2015    Procedure: CATARACT EXTRACTION PHACO AND INTRAOCULAR LENS PLACEMENT (IOC);  Surgeon: Ronnell Freshwater, MD;  Location: [facility];  Service: Ophthalmology;  Laterality: Right;  DIABETIC PER PT AND CINDY PLEASE KEEP ARRIVAL TIME AFTER 8AM    Current Outpatient Prescriptions on File Prior to Visit  Medication Sig Dispense Refill  . aspirin EC 81 MG tablet Take 81 mg by mouth at bedtime.     . carvedilol (COREG) 25 MG tablet TAKE ONE TABLET BY MOUTH TWICE DAILY 180 tablet 1  . citalopram (CELEXA) 20 MG tablet TAKE ONE TABLET BY MOUTH ONCE DAILY 90 tablet 4  . diazepam (VALIUM) 5 MG tablet TAKE ONE TABLET BY MOUTH TWICE DAILY AS NEEDED 60 tablet 0  . furosemide (LASIX) 20 MG tablet Take 1 tablet (20 mg total) by mouth daily. 90 tablet 3  . glucose blood (FREESTYLE LITE) test strip USE  STRIP TO CHECK BLOOD SUGAR THREE TIMES DAILY DX CODE E11.9 100 each 5  . Multiple Vitamin (MULTIVITAMIN WITH MINERALS) TABS Take 1 tablet by mouth daily.    Marland Kitchen  pravastatin (PRAVACHOL) 10 MG tablet TAKE ONE TABLET BY MOUTH ONCE DAILY AT BEDTIME 90 tablet 1  . valsartan (DIOVAN) 160 MG tablet Take 1 tablet (160 mg total) by mouth daily. 90 tablet 3  . Vitamin D, Ergocalciferol, (DRISDOL) 50000 UNITS CAPS capsule Take 1 capsule (50,000 Units total) by mouth every 7 (seven) days. 4 capsule 6  . ZENPEP 10000 units CPEP TAKE 1 CAPSULE BY MOUTH THREE TIMES A DAY BEFORE MEALS 90 capsule 5   No current facility-administered medications on file prior to visit.   Allergies  Allergen Reactions  . Enalapril Maleate Cough   Social History   Social History  . Marital Status: Married    Spouse Name: N/A  . Number of Children: N/A  .  Years of Education: N/A   Occupational History  . Not on file.   Social History Main Topics  . Smoking status: Never Smoker   . Smokeless tobacco: Never Used  . Alcohol Use: No  . Drug Use: No  . Sexual Activity: Not on file   Other Topics Concern  . Not on file   Social History Narrative      Review of Systems  All other systems reviewed and are negative.      Objective:   Physical Exam  Constitutional: He appears well-developed and well-nourished. No distress.  Neck: Neck supple. No JVD present. No thyromegaly present.  Cardiovascular: Normal rate, regular rhythm and normal heart sounds.  Exam reveals no gallop and no friction rub.   No murmur heard. Pulmonary/Chest: Effort normal and breath sounds normal. No respiratory distress. He has no wheezes. He has no rales.  Abdominal: Soft. Bowel sounds are normal. He exhibits no distension. There is no tenderness. There is no rebound and no guarding.  Musculoskeletal: He exhibits no edema.  Lymphadenopathy:    He has no cervical adenopathy.  Skin: He is not diaphoretic.  Vitals reviewed.         Assessment & Plan:  Benign essential HTN - Plan: CBC with Differential/Platelet, COMPLETE METABOLIC PANEL WITH GFR  CKD (chronic kidney disease) stage 3, GFR 30-59 ml/min  Blood pressure for him is outstanding. Due to the side effects and will have the patient discontinue clonidine and notify me of his blood pressures in 2 weeks. He is only taking one dose of clonidine at night and therefore I do not feel that we need to wean him down. Immunizations are up-to-date. Hemoglobin A1c is monitored by his endocrinologist. Patient is not fasting and therefore I cannot check fasting lipid panel today

## 2015-12-29 ENCOUNTER — Encounter: Payer: Self-pay | Admitting: Family Medicine

## 2016-01-05 ENCOUNTER — Other Ambulatory Visit: Payer: Self-pay | Admitting: Family Medicine

## 2016-01-05 NOTE — Telephone Encounter (Signed)
Refill appropriate and filled per protocol. 

## 2016-01-15 ENCOUNTER — Other Ambulatory Visit: Payer: Self-pay | Admitting: Family Medicine

## 2016-01-26 ENCOUNTER — Ambulatory Visit: Payer: PPO | Admitting: Endocrinology

## 2016-02-15 ENCOUNTER — Other Ambulatory Visit: Payer: Self-pay | Admitting: Endocrinology

## 2016-02-16 ENCOUNTER — Other Ambulatory Visit: Payer: Self-pay | Admitting: Family Medicine

## 2016-02-18 ENCOUNTER — Encounter: Payer: Self-pay | Admitting: Family Medicine

## 2016-02-19 NOTE — Telephone Encounter (Signed)
Medication called to pharmacy. 

## 2016-02-19 NOTE — Telephone Encounter (Signed)
Ok to refill 

## 2016-02-19 NOTE — Telephone Encounter (Signed)
ok 

## 2016-02-21 ENCOUNTER — Other Ambulatory Visit: Payer: Self-pay | Admitting: Family Medicine

## 2016-02-22 ENCOUNTER — Encounter: Payer: Self-pay | Admitting: Endocrinology

## 2016-02-22 ENCOUNTER — Ambulatory Visit (INDEPENDENT_AMBULATORY_CARE_PROVIDER_SITE_OTHER): Payer: PPO | Admitting: Endocrinology

## 2016-02-22 VITALS — BP 120/64 | HR 68 | Ht 68.0 in | Wt 214.0 lb

## 2016-02-22 DIAGNOSIS — E1165 Type 2 diabetes mellitus with hyperglycemia: Secondary | ICD-10-CM

## 2016-02-22 DIAGNOSIS — Z794 Long term (current) use of insulin: Secondary | ICD-10-CM | POA: Diagnosis not present

## 2016-02-22 LAB — COMPREHENSIVE METABOLIC PANEL
ALBUMIN: 3.6 g/dL (ref 3.5–5.2)
ALK PHOS: 104 U/L (ref 39–117)
ALT: 27 U/L (ref 0–53)
AST: 25 U/L (ref 0–37)
BILIRUBIN TOTAL: 0.4 mg/dL (ref 0.2–1.2)
BUN: 31 mg/dL — ABNORMAL HIGH (ref 6–23)
CALCIUM: 8.7 mg/dL (ref 8.4–10.5)
CHLORIDE: 106 meq/L (ref 96–112)
CO2: 25 mEq/L (ref 19–32)
CREATININE: 1.54 mg/dL — AB (ref 0.40–1.50)
GFR: 47.05 mL/min — ABNORMAL LOW (ref >60.00)
Glucose, Bld: 162 mg/dL — ABNORMAL HIGH (ref 70–99)
Potassium: 4.5 mEq/L (ref 3.5–5.1)
Sodium: 137 mEq/L (ref 135–145)
TOTAL PROTEIN: 6.6 g/dL (ref 6.0–8.3)

## 2016-02-22 LAB — LIPID PANEL
CHOL/HDL RATIO: 3
Cholesterol: 97 mg/dL (ref 0–200)
HDL: 30.7 mg/dL — ABNORMAL LOW (ref >39.00)
LDL Cholesterol: 41 mg/dL (ref 0–99)
NONHDL: 66.25
TRIGLYCERIDES: 126 mg/dL (ref 0.0–149.0)
VLDL: 25.2 mg/dL (ref 0.0–40.0)

## 2016-02-22 LAB — MICROALBUMIN / CREATININE URINE RATIO
CREATININE, U: 89.6 mg/dL
MICROALB UR: 1.7 mg/dL (ref 0.0–1.9)
Microalb Creat Ratio: 1.9 mg/g (ref 0.0–30.0)

## 2016-02-22 LAB — POCT GLYCOSYLATED HEMOGLOBIN (HGB A1C): HEMOGLOBIN A1C: 7.9

## 2016-02-22 NOTE — Patient Instructions (Signed)
Less sugars in am and more 2 hrs after Bfst or lunch  Supper dose 8 R, Bedtime 8 N  R 5-6 at lunch  Protein at [facility]

## 2016-02-22 NOTE — Progress Notes (Signed)
Patient ID: Mark Hose., male   DOB: January 11, 1941, 75 y.o.   MRN: [id number]           Reason for Appointment: Follow-up for Type 2 Diabetes  Referring physician: Pickard  History of Present Illness:          Date of diagnosis of type 2 diabetes mellitus :        Background history:  The first few years of his diabetes he was treated with Metformin At some point he was also given Actos by his PCP for control However because of worsening control he was started on insulin probably Lantus in 2005 and subsequently 70/30 insulin He was subsequently taking 45 units a day of premixed insulin in 2 divided doses   Recent history:  INSULIN regimen :  Novolin N  R 6 u qam and  10 u at dinner Novolin N 14 --10  Because of poor control and variability he was started on the V-go pump with the 20 unit of basal on 05/10/15  However he stopped this in 6/17 because of the high cost His A1c stays about the same at 7.9  Current blood sugar patterns and problems identified:  His fasting blood sugars have been less variable and on an average fairly good  Has had a couple of low sugar episodes in the mornings and only once recently  He now says that if his blood sugars are low normal at bedtime he will not take his NPH but his blood sugar will not be higher the next day  However blood sugars after supper are quite variable as before  He is not adjusting his evening dose at suppertime based on what he is eating.  Also he thinks that he has better readings if he takes the insulin 20-30 minutes before which he does not always do  He now says that he may sometimes have a significant amount of carbohydrate lack assignments or leftovers from dinner at lunchtime but does not take any insulin at this time  Also does not get any protein with his cereal in the morning frequently.  Has had a couple of significant high readings midday, does not know why  Oral hypoglycemic drugs the patient is taking  are: Actos 30 mg daily      Side effects from medications have been: Some swelling from Actos treated with Lasix  Compliance with the medical regimen: Fair, he thinks he can do better on diet Hypoglycemia: Symptoms are as follows: Sees lights flickering, off balance   Glucose monitoring:  done 2 times a day         Glucometer: Lamar Sprinkles    Blood Glucose readings by time of day and averages from download recently  Mean values apply above for all meters except median for One Touch  PRE-MEAL Fasting Lunch Dinner Bedtime Overall  Glucose range: 53-159  118-326   51-251    Mean/median: 115    130 129    Self-care: The diet that the patient has been following is: tries to limit sweets.     Meal times: Breakfast: 9 AM.  Lunch: 1-2 pm, Dinner: 5 pm. Eating out 3/7 days a week in the evenings   Typical meal intake: Breakfast cereal, now Eggs, sausage and egg toast. .  Lunch is a sandwich sometimes, usually a snack.  Dinner is meat and vegetables, has peanut butter snacks periodically at lunch or bedtime   Dietician visit, most recent: several years ago  Exercise: active in workshop or yard, has back pain Limiting his walking   Weight history:   Wt Readings from Last 3 Encounters:  02/22/16 214 lb (97.07 kg)  12/28/15 218 lb (98.884 kg)  11/27/15 221 lb 9.6 oz (100.517 kg)    Glycemic control:   Lab Results  Component Value Date   HGBA1C 7.9 02/22/2016   HGBA1C 7.9 10/16/2015   HGBA1C 7.9 07/17/2015   Lab Results  Component Value Date   MICROALBUR 1.7 02/22/2016   LDLCALC 41 02/22/2016   CREATININE 1.54* 02/22/2016         Medication List       This list is accurate as of: 02/22/16 11:59 PM.  Always use your most recent med list.               aspirin EC 81 MG tablet  Take 81 mg by mouth at bedtime.     carvedilol 25 MG tablet  Commonly known as:  COREG  TAKE ONE TABLET BY MOUTH TWICE DAILY     citalopram 20 MG tablet  Commonly known as:  CELEXA    TAKE ONE TABLET BY MOUTH ONCE DAILY     diazepam 5 MG tablet  Commonly known as:  VALIUM  TAKE ONE TABLET BY MOUTH TWICE DAILY AS NEEDED     doxazosin 8 MG tablet  Commonly known as:  CARDURA  Take 1 tablet (8 mg total) by mouth daily.     FREESTYLE LITE test strip  Generic drug:  glucose blood  USE ONE STRIP TO CHECK GLUCOSE THREE TIMES DAILY     furosemide 20 MG tablet  Commonly known as:  LASIX  TAKE ONE TABLET BY MOUTH ONCE DAILY     gabapentin 300 MG capsule  Commonly known as:  NEURONTIN  Take 2 capsules (600 mg total) by mouth at bedtime.     insulin NPH Human 100 UNIT/ML injection  Commonly known as:  HUMULIN N,NOVOLIN N  10u qam - 10 u qhs     insulin regular 100 units/mL injection  Commonly known as:  NOVOLIN R,HUMULIN R  5 u qam and 8-10 u at dinner     multivitamin with minerals Tabs tablet  Take 1 tablet by mouth daily.     pravastatin 10 MG tablet  Commonly known as:  PRAVACHOL  TAKE ONE TABLET BY MOUTH ONCE DAILY AT BEDTIME     valsartan 160 MG tablet  Commonly known as:  DIOVAN  Take 1 tablet (160 mg total) by mouth daily.     Vitamin D (Ergocalciferol) 50000 units Caps capsule  Commonly known as:  DRISDOL  Take 1 capsule (50,000 Units total) by mouth every 7 (seven) days.     ZENPEP 10000 units Cpep  Generic drug:  Pancrelipase (Lip-Prot-Amyl)  TAKE 1 CAPSULE BY MOUTH THREE TIMES A DAY BEFORE MEALS        Allergies:  Allergies  Allergen Reactions  . Enalapril Maleate Cough    Past Medical History  Diagnosis Date  . Type 2 diabetes mellitus ([place])   . Essential hypertension, benign   . Prostate cancer ([place]) 2011  . Mixed hyperlipidemia   . Chronic back pain     Disc disease  . Pancreatic insufficiency (HCC)     Diagnosed at [facility]  . Diabetes mellitus   . Coronary atherosclerosis of native coronary artery     Coronary calcifications by chest CT, Myoview demonstrating inferolateral scar  . Diastolic dysfunction 12/4980  Grade 1.   . Hyperkalemia   . Kidney stones   . Chronic kidney disease   . Subdural hematoma ([place])   . Arthritis     lower back  . Shortness of breath dyspnea     occasional - liver pressing on right lung, decreased capacity  . Heart murmur     in past  . HOH (hard of hearing)   . Wears hearing aid     "crossover" form right to left  . Deafness in right ear     Past Surgical History  Procedure Laterality Date  . Prostatectomy  2011  . Hernia repair    . Appendectomy    . Cholecystectomy    . Bilateral hip replacement    . Cataract extraction w/phaco Left 05/15/2015    Procedure: CATARACT EXTRACTION PHACO AND INTRAOCULAR LENS PLACEMENT (IOC);  Surgeon: Ronnell Freshwater, MD;  Location: [facility];  Service: Ophthalmology;  Laterality: Left;  DIABETIC - insulin pump and oral meds  . Cataract extraction w/phaco Right 08/28/2015    Procedure: CATARACT EXTRACTION PHACO AND INTRAOCULAR LENS PLACEMENT (IOC);  Surgeon: Ronnell Freshwater, MD;  Location: [facility];  Service: Ophthalmology;  Laterality: Right;  DIABETIC PER PT AND CINDY PLEASE KEEP ARRIVAL TIME AFTER 8AM     Family History  Problem Relation Age of Onset  . Heart attack Father   . Diabetes type II Mother     Social History:  reports that he has never smoked. He has never used smokeless tobacco. He reports that he does not drink alcohol or use illicit drugs.    Review of Systems    Lipid history: On 79m Pravastatin from PCP for Cardiovascular prophylaxis    Lab Results  Component Value Date   CHOL 97 02/22/2016   HDL 30.70* 02/22/2016   LDLCALC 41 02/22/2016   TRIG 126.0 02/22/2016   CHOLHDL 3 02/22/2016            Most recent eye exam was in 9/16, apparently no retinopathy   Hypertension: , treated with carvedilol 25 mg,  valsartan 160 mg and doxazosin With good control .       He is taking gabapentin for his muscle cramps and not neuropathy  Endocrine: Hypogonadism has been  present for at least a few years but his urologist has not wanted to treat him because of history of prostate cancer treated surgically several years ago  His last level:  Lab Results  Component Value Date   TESTOSTERONE 255* 02/28/2015     Physical Examination:  BP 120/64 mmHg  Pulse 68  Ht 5' 8"  (1.727 m)  Wt 214 lb (97.07 kg)  BMI 32.55 kg/m2  SpO2 95%   ASSESSMENT:  Diabetes type 2, uncontrolled    See history of present illness for detailed discussion of his current management, blood sugar patterns and problems identified Currently with taking NPH and regular insulin his blood sugar control is similar to what he was getting with the V-go pump However he may be getting higher readings after breakfast and lunch since he does not take enough mealtime coverage and none at lunchtime However does not monitor readings after breakfast and lunch usually Not eating enough protein at breakfast usually Appears to have fairly good readings after supper and better than before, only sporadically has high readings based on his diet or timing of the evening insulin   PLAN:   Discussed eating balanced meals especially at breakfast and sources of protein  He will need to trying check blood sugar more consistently after breakfast and lunch and less in the morning  Discussed blood sugar targets at various times  Most likely can take only 8 units regular insulin at suppertime or less eating a larger meal or desserts  Cut down the bedtime NPH to 8 units and take this regularly regardless of bedtime blood sugar  Try to take 5-6 units of regular insulin at lunch unless skipping carbohydrates and may need to adjust this if sugars are higher after eating  Labs to be done today and forwarded to PCP  Counseling time on subjects discussed above is over 50% of today's 25 minute visit  Patient Instructions  Less sugars in am and more 2 hrs after Bfst or lunch  Supper dose 8 R, Bedtime 8  N  R 5-6 at lunch  Protein at [place] 02/23/2016, 12:20 PM   Note: This office note was prepared with Dragon voice recognition system technology. Any transcriptional errors that result from this process are unintentional.

## 2016-02-24 ENCOUNTER — Encounter: Payer: Self-pay | Admitting: Family Medicine

## 2016-02-27 DIAGNOSIS — Z96643 Presence of artificial hip joint, bilateral: Secondary | ICD-10-CM | POA: Diagnosis not present

## 2016-03-05 ENCOUNTER — Encounter: Payer: Self-pay | Admitting: Family Medicine

## 2016-03-05 ENCOUNTER — Other Ambulatory Visit: Payer: Self-pay | Admitting: Family Medicine

## 2016-03-05 DIAGNOSIS — Z794 Long term (current) use of insulin: Principal | ICD-10-CM

## 2016-03-05 DIAGNOSIS — E119 Type 2 diabetes mellitus without complications: Secondary | ICD-10-CM

## 2016-03-08 ENCOUNTER — Encounter: Payer: Self-pay | Admitting: Sports Medicine

## 2016-03-14 DIAGNOSIS — L080 Pyoderma: Secondary | ICD-10-CM | POA: Diagnosis not present

## 2016-03-14 DIAGNOSIS — L570 Actinic keratosis: Secondary | ICD-10-CM | POA: Diagnosis not present

## 2016-03-14 DIAGNOSIS — D225 Melanocytic nevi of trunk: Secondary | ICD-10-CM | POA: Diagnosis not present

## 2016-03-14 DIAGNOSIS — L02223 Furuncle of chest wall: Secondary | ICD-10-CM | POA: Diagnosis not present

## 2016-03-14 DIAGNOSIS — X32XXXD Exposure to sunlight, subsequent encounter: Secondary | ICD-10-CM | POA: Diagnosis not present

## 2016-03-14 DIAGNOSIS — L820 Inflamed seborrheic keratosis: Secondary | ICD-10-CM | POA: Diagnosis not present

## 2016-03-14 DIAGNOSIS — B9689 Other specified bacterial agents as the cause of diseases classified elsewhere: Secondary | ICD-10-CM | POA: Diagnosis not present

## 2016-03-18 ENCOUNTER — Encounter: Payer: Self-pay | Admitting: Family Medicine

## 2016-03-20 MED ORDER — TRAMADOL HCL 50 MG PO TABS: 50.0000 mg | ORAL_TABLET | Freq: Three times a day (TID) | ORAL | 0 refills | Status: DC | PRN

## 2016-03-20 NOTE — Telephone Encounter (Signed)
Medication called/sent to requested pharmacy and pt aware via mychart

## 2016-03-26 ENCOUNTER — Ambulatory Visit (INDEPENDENT_AMBULATORY_CARE_PROVIDER_SITE_OTHER): Payer: PPO | Admitting: Sports Medicine

## 2016-03-26 ENCOUNTER — Encounter: Payer: Self-pay | Admitting: Sports Medicine

## 2016-03-26 DIAGNOSIS — B351 Tinea unguium: Secondary | ICD-10-CM

## 2016-03-26 DIAGNOSIS — M79675 Pain in left toe(s): Secondary | ICD-10-CM | POA: Diagnosis not present

## 2016-03-26 DIAGNOSIS — M79674 Pain in right toe(s): Secondary | ICD-10-CM | POA: Diagnosis not present

## 2016-03-26 DIAGNOSIS — E1140 Type 2 diabetes mellitus with diabetic neuropathy, unspecified: Secondary | ICD-10-CM | POA: Diagnosis not present

## 2016-03-26 LAB — HM DIABETES FOOT EXAM

## 2016-03-26 NOTE — Patient Instructions (Signed)
Diabetes and Foot Care Diabetes may cause you to have problems because of poor blood supply (circulation) to your feet and legs. This may cause the skin on your feet to become thinner, break easier, and heal more slowly. Your skin may become dry, and the skin may peel and crack. You may also have nerve damage in your legs and feet causing decreased feeling in them. You may not notice minor injuries to your feet that could lead to infections or more serious problems. Taking care of your feet is one of the most important things you can do for yourself.  HOME CARE INSTRUCTIONS  Wear shoes at all times, even in the house. Do not go barefoot. Bare feet are easily injured.  Check your feet daily for blisters, cuts, and redness. If you cannot see the bottom of your feet, use a mirror or ask someone for help.  Wash your feet with warm water (do not use hot water) and mild soap. Then pat your feet and the areas between your toes until they are completely dry. Do not soak your feet as this can dry your skin.  Apply a moisturizing lotion or petroleum jelly (that does not contain alcohol and is unscented) to the skin on your feet and to dry, brittle toenails. Do not apply lotion between your toes.  Trim your toenails straight across. Do not dig under them or around the cuticle. File the edges of your nails with an emery board or nail file.  Do not cut corns or calluses or try to remove them with medicine.  Wear clean socks or stockings every day. Make sure they are not too tight. Do not wear knee-high stockings since they may decrease blood flow to your legs.  Wear shoes that fit properly and have enough cushioning. To break in new shoes, wear them for just a few hours a day. This prevents you from injuring your feet. Always look in your shoes before you put them on to be sure there are no objects inside.  Do not cross your legs. This may decrease the blood flow to your feet.  If you find a minor scrape,  cut, or break in the skin on your feet, keep it and the skin around it clean and dry. These areas may be cleansed with mild soap and water. Do not cleanse the area with peroxide, alcohol, or iodine.  When you remove an adhesive bandage, be sure not to damage the skin around it.  If you have a wound, look at it several times a day to make sure it is healing.  Do not use heating pads or hot water bottles. They may burn your skin. If you have lost feeling in your feet or legs, you may not know it is happening until it is too late.  Make sure your health care provider performs a complete foot exam at least annually or more often if you have foot problems. Report any cuts, sores, or bruises to your health care provider immediately. SEEK MEDICAL CARE IF:   You have an injury that is not healing.  You have cuts or breaks in the skin.  You have an ingrown nail.  You notice redness on your legs or feet.  You feel burning or tingling in your legs or feet.  You have pain or cramps in your legs and feet.  Your legs or feet are numb.  Your feet always feel cold. SEEK IMMEDIATE MEDICAL CARE IF:   There is increasing redness,   swelling, or pain in or around a wound.  There is a red line that goes up your leg.  Pus is coming from a wound.  You develop a fever or as directed by your health care provider.  You notice a bad smell coming from an ulcer or wound.   This information is not intended to replace advice given to you by your health care provider. Make sure you discuss any questions you have with your health care provider.   Document Released: 07/19/2000 Document Revised: 03/24/2013 Document Reviewed: 12/29/2012 Elsevier Interactive Patient Education 2016 [employer]  

## 2016-03-26 NOTE — Progress Notes (Signed)
Subjective: Mark L Hocker Sr. is a 75 y.o. male patient with history of diabetes who presents to office today complaining of long, painful nails while ambulating in shoes; unable to trim. Patient states that the glucose reading this morning was not recorded but last A1c 7.9. Patient denies any new changes in medication or new problems. Patient denies any new cramping, numbness, burning or tingling in the legs. Admits to baseline numbness right>left.   Patient Active Problem List   Diagnosis Date Noted  . ED (erectile dysfunction) 01/05/2014  . Shortness of breath 12/05/2011  . Coronary atherosclerosis of native coronary artery 12/05/2011  . Type 2 diabetes mellitus ([place]) 12/05/2011  . Essential hypertension, benign 12/05/2011  . Mixed hyperlipidemia 12/05/2011  . Malignant neoplasm of prostate ([place]) 06/26/2011   Current Outpatient Prescriptions on File Prior to Visit  Medication Sig Dispense Refill  . aspirin EC 81 MG tablet Take 81 mg by mouth at bedtime.     . carvedilol (COREG) 25 MG tablet TAKE ONE TABLET BY MOUTH TWICE DAILY 180 tablet 3  . citalopram (CELEXA) 20 MG tablet TAKE ONE TABLET BY MOUTH ONCE DAILY 90 tablet 4  . diazepam (VALIUM) 5 MG tablet TAKE ONE TABLET BY MOUTH TWICE DAILY AS NEEDED 60 tablet 0  . doxazosin (CARDURA) 8 MG tablet Take 1 tablet (8 mg total) by mouth daily. 90 tablet 3  . FREESTYLE LITE test strip USE ONE STRIP TO CHECK GLUCOSE THREE TIMES DAILY 100 each 1  . furosemide (LASIX) 20 MG tablet TAKE ONE TABLET BY MOUTH ONCE DAILY 90 tablet 0  . gabapentin (NEURONTIN) 300 MG capsule Take 2 capsules (600 mg total) by mouth at bedtime. 180 capsule 3  . insulin NPH Human (HUMULIN N,NOVOLIN N) 100 UNIT/ML injection 10u qam - 10 u qhs    . insulin regular (NOVOLIN R,HUMULIN R) 100 units/mL injection 5 u qam and 8-10 u at dinner    . Multiple Vitamin (MULTIVITAMIN WITH MINERALS) TABS Take 1 tablet by mouth daily.    . pravastatin (PRAVACHOL) 10 MG tablet TAKE ONE  TABLET BY MOUTH ONCE DAILY AT BEDTIME 90 tablet 3  . traMADol (ULTRAM) 50 MG tablet Take 1 tablet (50 mg total) by mouth every 8 (eight) hours as needed. 30 tablet 0  . valsartan (DIOVAN) 160 MG tablet Take 1 tablet (160 mg total) by mouth daily. 90 tablet 3  . Vitamin D, Ergocalciferol, (DRISDOL) 50000 UNITS CAPS capsule Take 1 capsule (50,000 Units total) by mouth every 7 (seven) days. (Patient not taking: Reported on 02/22/2016) 4 capsule 6  . ZENPEP 10000 units CPEP TAKE 1 CAPSULE BY MOUTH THREE TIMES A DAY BEFORE MEALS 90 capsule 5   No current facility-administered medications on file prior to visit.    Allergies  Allergen Reactions  . Enalapril Maleate Cough    Recent Results (from the past 2160 hour(s))  CBC with Differential/Platelet     Status: Abnormal   Collection Time: 12/28/15 11:58 AM  Result Value Ref Range   WBC 5.7 3.8 - 10.8 K/uL   RBC 4.35 4.20 - 5.80 MIL/uL   Hemoglobin 13.0 13.0 - 17.0 g/dL   HCT 41.4 38.5 - 50.0 %   MCV 95.2 80.0 - 100.0 fL   MCH 29.9 27.0 - 33.0 pg   MCHC 31.4 (L) 32.0 - 36.0 g/dL   RDW 13.6 11.0 - 15.0 %   Platelets 170 140 - 400 K/uL   MPV 10.0 7.5 - 12.5 fL   Neutro  Abs 3,420 1,500 - 7,800 cells/uL   Lymphs Abs 1,539 850 - 3,900 cells/uL   Monocytes Absolute 456 200 - 950 cells/uL   Eosinophils Absolute 285 15 - 500 cells/uL   Basophils Absolute 0 0 - 200 cells/uL   Neutrophils Relative % 60 %   Lymphocytes Relative 27 %   Monocytes Relative 8 %   Eosinophils Relative 5 %   Basophils Relative 0 %   Smear Review Criteria for review not met     Comment: ** Please note change in unit of measure and reference range(s). **  COMPLETE METABOLIC PANEL WITH GFR     Status: Abnormal   Collection Time: 12/28/15 11:58 AM  Result Value Ref Range   Sodium 137 135 - 146 mmol/L   Potassium 4.8 3.5 - 5.3 mmol/L   Chloride 104 98 - 110 mmol/L   CO2 25 20 - 31 mmol/L   Glucose, Bld 245 (H) 70 - 99 mg/dL   BUN 30 (H) 7 - 25 mg/dL   Creat 1.57 (H)  0.70 - 1.18 mg/dL   Total Bilirubin 0.5 0.2 - 1.2 mg/dL   Alkaline Phosphatase 127 (H) 40 - 115 U/L   AST 23 10 - 35 U/L   ALT 27 9 - 46 U/L   Total Protein 6.4 6.1 - 8.1 g/dL   Albumin 3.6 3.6 - 5.1 g/dL   Calcium 8.4 (L) 8.6 - 10.3 mg/dL   GFR, Est African American 49 (L) >=60 mL/min   GFR, Est Non African American 43 (L) >=60 mL/min    Comment:   The estimated GFR is a calculation valid for adults (>=83 years old) that uses the CKD-EPI algorithm to adjust for age and sex. It is   not to be used for children, pregnant women, hospitalized patients,    patients on dialysis, or with rapidly changing kidney function. According to the NKDEP, eGFR >89 is normal, 60-89 shows mild impairment, 30-59 shows moderate impairment, 15-29 shows severe impairment and <15 is ESRD.     Comprehensive metabolic panel     Status: Abnormal   Collection Time: 02/22/16  3:22 PM  Result Value Ref Range   Sodium 137 135 - 145 mEq/L   Potassium 4.5 3.5 - 5.1 mEq/L   Chloride 106 96 - 112 mEq/L   CO2 25 19 - 32 mEq/L   Glucose, Bld 162 (H) 70 - 99 mg/dL   BUN 31 (H) 6 - 23 mg/dL   Creatinine, Ser 1.54 (H) 0.40 - 1.50 mg/dL   Total Bilirubin 0.4 0.2 - 1.2 mg/dL   Alkaline Phosphatase 104 39 - 117 U/L   AST 25 0 - 37 U/L   ALT 27 0 - 53 U/L   Total Protein 6.6 6.0 - 8.3 g/dL   Albumin 3.6 3.5 - 5.2 g/dL   Calcium 8.7 8.4 - 10.5 mg/dL   GFR 47.05 (L) >60.00 mL/min  Microalbumin / creatinine urine ratio     Status: None   Collection Time: 02/22/16  3:22 PM  Result Value Ref Range   Microalb, Ur 1.7 0.0 - 1.9 mg/dL   Creatinine,U 89.6 mg/dL   Microalb Creat Ratio 1.9 0.0 - 30.0 mg/g  Lipid panel     Status: Abnormal   Collection Time: 02/22/16  3:22 PM  Result Value Ref Range   Cholesterol 97 0 - 200 mg/dL    Comment: ATP III Classification       Desirable:  < 200 mg/dL  Borderline High:  200 - 239 mg/dL          High:  > = 240 mg/dL   Triglycerides 126.0 0.0 - 149.0 mg/dL    Comment:  Normal:  <150 mg/dLBorderline High:  150 - 199 mg/dL   HDL 30.70 (L) >39.00 mg/dL   VLDL 25.2 0.0 - 40.0 mg/dL   LDL Cholesterol 41 0 - 99 mg/dL   Total CHOL/HDL Ratio 3     Comment:                Men          Women1/2 Average Risk     3.4          3.3Average Risk          5.0          4.42X Average Risk          9.6          7.13X Average Risk          15.0          11.0                       NonHDL 66.25     Comment: NOTE:  Non-HDL goal should be 30 mg/dL higher than patient's LDL goal (i.e. LDL goal of < 70 mg/dL, would have non-HDL goal of < 100 mg/dL)  POCT glycosylated hemoglobin (Hb A1C)     Status: None   Collection Time: 02/22/16  3:47 PM  Result Value Ref Range   Hemoglobin A1C 7.9     Objective: General: Patient is awake, alert, and oriented x 3 and in no acute distress.  Integument: Skin is warm, dry and supple bilateral. Nails are tender, long, thickened and dystrophic with subungual debris, consistent with onychomycosis, 1-5 bilateral. No signs of infection. No open lesions or preulcerative lesions present bilateral. Diffuse dry callus skin to heels. Remaining integument unremarkable.  Vasculature:  Dorsalis Pedis pulse 1/4 bilateral. Posterior Tibial pulse  1/4 bilateral. Capillary fill time <3 sec 1-5 bilateral. Positive hair growth to the level of the digits.Temperature gradient within normal limits. No varicosities present bilateral. No edema present bilateral.   Neurology: The patient has intact sensation measured with a 5.07/10g Semmes Weinstein Monofilament at all pedal sites bilateral . Vibratory sensation diminished bilateral with tuning fork. No Babinski sign present bilateral.   Musculoskeletal: Asymptomatic mild hammertoe pedal deformities noted bilateral. Muscular strength 5/5 in all lower extremity muscular groups bilateral without pain on range of motion . No tenderness with calf compression bilateral.  Assessment and Plan: Problem List Items Addressed This  Visit    None    Visit Diagnoses    Dermatophytosis of nail    -  Primary   Toe pain, bilateral       Type 2 diabetes mellitus with diabetic neuropathy, unspecified long term insulin use status ([place])          -Examined patient. -Discussed and educated patient on diabetic foot care, especially with  regards to the vascular, neurological and musculoskeletal systems.  -Stressed the importance of good glycemic control and the detriment of not  controlling glucose levels in relation to the foot. -Mechanically debrided all nails 1-5 bilateral using sterile nail nipper and filed with dremel without incident  -[employer] step diabetic shoe order form was completed; office to contact primary care/endocrinologist for approval / certification;  Office to arrange shoe fitting and dispensing. -Answered all  patient questions -Patient to return  in 3 months for at risk foot care -Patient advised to call the office if any problems or questions arise in the meantime.  Landis Martins, DPM

## 2016-04-03 ENCOUNTER — Other Ambulatory Visit: Payer: Self-pay | Admitting: Family Medicine

## 2016-04-04 ENCOUNTER — Other Ambulatory Visit: Payer: Self-pay | Admitting: Family Medicine

## 2016-04-17 ENCOUNTER — Other Ambulatory Visit (HOSPITAL_COMMUNITY): Payer: Self-pay | Admitting: Ophthalmology

## 2016-04-17 DIAGNOSIS — E113292 Type 2 diabetes mellitus with mild nonproliferative diabetic retinopathy without macular edema, left eye: Secondary | ICD-10-CM | POA: Diagnosis not present

## 2016-04-17 DIAGNOSIS — H3582 Retinal ischemia: Secondary | ICD-10-CM

## 2016-04-23 ENCOUNTER — Ambulatory Visit (HOSPITAL_COMMUNITY)
Admission: RE | Admit: 2016-04-23 | Discharge: 2016-04-23 | Disposition: A | Discharge status: Home / Self Care | Payer: PPO | Source: Ambulatory Visit | Attending: Ophthalmology | Admitting: Ophthalmology

## 2016-04-23 DIAGNOSIS — H3582 Retinal ischemia: Secondary | ICD-10-CM | POA: Diagnosis not present

## 2016-04-23 DIAGNOSIS — I6523 Occlusion and stenosis of bilateral carotid arteries: Secondary | ICD-10-CM | POA: Diagnosis not present

## 2016-04-25 ENCOUNTER — Ambulatory Visit (HOSPITAL_COMMUNITY): Payer: PPO

## 2016-05-02 ENCOUNTER — Other Ambulatory Visit: Payer: Self-pay | Admitting: Family Medicine

## 2016-05-08 ENCOUNTER — Other Ambulatory Visit: Payer: Self-pay | Admitting: Family Medicine

## 2016-05-08 NOTE — Telephone Encounter (Signed)
Ok to refill 

## 2016-05-09 NOTE — Telephone Encounter (Signed)
ok 

## 2016-05-09 NOTE — Telephone Encounter (Signed)
Medication called to pharmacy. 

## 2016-05-15 DIAGNOSIS — E113292 Type 2 diabetes mellitus with mild nonproliferative diabetic retinopathy without macular edema, left eye: Secondary | ICD-10-CM | POA: Diagnosis not present

## 2016-05-22 ENCOUNTER — Other Ambulatory Visit: Payer: Self-pay | Admitting: Endocrinology

## 2016-05-24 ENCOUNTER — Ambulatory Visit (INDEPENDENT_AMBULATORY_CARE_PROVIDER_SITE_OTHER): Payer: PPO | Admitting: Endocrinology

## 2016-05-24 ENCOUNTER — Encounter: Payer: Self-pay | Admitting: Endocrinology

## 2016-05-24 VITALS — BP 124/76 | HR 68 | Temp 98.1°F | Ht 68.0 in | Wt 213.6 lb

## 2016-05-24 DIAGNOSIS — E119 Type 2 diabetes mellitus without complications: Secondary | ICD-10-CM | POA: Diagnosis not present

## 2016-05-24 DIAGNOSIS — E1165 Type 2 diabetes mellitus with hyperglycemia: Secondary | ICD-10-CM

## 2016-05-24 DIAGNOSIS — Z794 Long term (current) use of insulin: Secondary | ICD-10-CM | POA: Diagnosis not present

## 2016-05-24 LAB — POCT GLYCOSYLATED HEMOGLOBIN (HGB A1C): HEMOGLOBIN A1C: 7.1

## 2016-05-24 NOTE — Patient Instructions (Addendum)
8 Units N at BEDTIME  N insulin 12 units in am  Snack before yardwork in ams  If going to be active after lunch reduce lunch dose by 3 units  Regular insulin 10 in am and 8-10 at supper based on meal size  Check blood sugars on waking up  4x per week  Also check blood sugars about 2 hours after a meal and do this after different meals by rotation  Recommended blood sugar levels on waking up is 90-130 and about 2 hours after meal is 130-160  Please bring your blood sugar monitor to each visit, thank you

## 2016-05-24 NOTE — Progress Notes (Signed)
Patient ID: Mark Hose., male   DOB: [birth year], 75 y.o.   MRN: [id number]           Reason for Appointment: Follow-up for Type 2 Diabetes  Referring physician: [place]  History of Present Illness:          Date of diagnosis of type 2 diabetes mellitus :        Background history:  The first few years of his diabetes he was treated with Metformin At some point he was also given Actos by his PCP for control However because of worsening control he was started on insulin probably Lantus in 2005 and subsequently 70/30 insulin He was taking 45 units a day of premixed insulin in 2 divided doses  Because of poor control and variability he was started on the V-go pump with the 20 unit of basal on 05/10/15  However he stopped this in 6/17 because of the high cost  Recent history:   INSULIN regimen :  Novolin N  R 10 u qam, 10 acl and  0-10 u at dinner Novolin N 10 --10  His A1c is now significantly better at 7.1, previously steady at 7.9  Current blood sugar patterns and problems identified:  He has not been taking the prescribed amounts of insulin and is taking 10 units of all the insulin doses.  He was advised to take 5-6 units to cover his lunch meal but is taking 10 units of regular insulin before lunch  He does not adjust his mealtime insulin based on what he is eating  Bedtime NPH: He is adjusting this based on what the blood sugars at bedtime and if it is low normal even skip it completely  Hypoglycemia: He thinks he is getting this 2 or 3 times a week including 1 twice late at night.  He has rare blood sugars low fasting, once around 2 PM and rarely documented at bedtime  If he feels hypoglycemic at night he will ask his wife to help treat the symptoms  FASTING blood sugars are generally fairly good within range of 70-140 and low only once at 56  Glucose readings at 12 PM-1 PM are significantly high even though he has taken 10 units of Regular Insulin in the morning.   However he has cut back on his NPH from 14 down to 10 in the morning  Usually not checking blood sugars 2 hours after meals but mostly breakfast and bedtime  Oral hypoglycemic drugs the patient is taking are: Actos 30 mg daily      Side effects from medications have been: Some swelling from Actos treated with Lasix  Compliance with the medical regimen: Fair, he thinks he can do better on diet Hypoglycemia: Symptoms are as follows: Sees lights flickering, off balance   Glucose monitoring:  done 2 times a day         Glucometer: Lamar Sprinkles    Blood Glucose readings by time of day and averages from download recently  Mean values apply above for all meters except median for One Touch  PRE-MEAL Fasting Lunch Dinner Bedtime Overall  Glucose range: 56-186  215-304   60-226  56-304  Mean/median: 118    135    Self-care: The diet that the patient has been following is: tries to limit sweets.     Meal times: Breakfast: 9 AM.  Lunch: 1-2 pm, Dinner: 5 pm. Eating out 3/7 days a week in the evenings   Typical meal intake: Breakfast  cereal, now Eggs, sausage and egg toast. .  Lunch is a sandwich sometimes, usually a snack.  Dinner is meat and vegetables, has peanut butter snacks periodically at lunch or bedtime   Dietician visit, most recent: several years ago               Exercise: active in workshop or yard, has back pain Limiting his walking   Weight history:   Wt Readings from Last 3 Encounters:  05/24/16 213 lb 9.6 oz (96.9 kg)  02/22/16 214 lb (97.1 kg)  12/28/15 218 lb (98.9 kg)    Glycemic control:   Lab Results  Component Value Date   HGBA1C 7.1 05/24/2016   HGBA1C 7.9 02/22/2016   HGBA1C 7.9 10/16/2015   Lab Results  Component Value Date   MICROALBUR 1.7 02/22/2016   LDLCALC 41 02/22/2016   CREATININE 1.54 (H) 02/22/2016         Medication List       Accurate as of 05/24/16  2:00 PM. Always use your most recent med list.          aspirin EC 81 MG  tablet Take 81 mg by mouth at bedtime.   carvedilol 25 MG tablet Commonly known as:  COREG TAKE ONE TABLET BY MOUTH TWICE DAILY   citalopram 20 MG tablet Commonly known as:  CELEXA TAKE ONE TABLET BY MOUTH ONCE DAILY   diazepam 5 MG tablet Commonly known as:  VALIUM TAKE ONE TABLET BY MOUTH TWICE DAILY AS NEEDED   doxazosin 8 MG tablet Commonly known as:  CARDURA Take 1 tablet (8 mg total) by mouth daily.   FREESTYLE LITE test strip Generic drug:  glucose blood USE ONE STRIP TO CHECK GLUCOSE THREE TIMES DAILY   furosemide 20 MG tablet Commonly known as:  LASIX TAKE ONE TABLET BY MOUTH ONCE DAILY   gabapentin 300 MG capsule Commonly known as:  NEURONTIN Take 2 capsules (600 mg total) by mouth at bedtime.   insulin NPH Human 100 UNIT/ML injection Commonly known as:  HUMULIN N,NOVOLIN N 10u qam - 10 u qhs   insulin regular 100 units/mL injection Commonly known as:  NOVOLIN R,HUMULIN R 10 u qam and 8-10 u at dinner   multivitamin with minerals Tabs tablet Take 1 tablet by mouth daily.   pravastatin 10 MG tablet Commonly known as:  PRAVACHOL TAKE ONE TABLET BY MOUTH ONCE DAILY AT BEDTIME   traMADol 50 MG tablet Commonly known as:  ULTRAM TAKE ONE TABLET BY MOUTH EVERY 8 HOURS AS NEEDED   valsartan 160 MG tablet Commonly known as:  DIOVAN Take 1 tablet (160 mg total) by mouth daily.   Vitamin D (Ergocalciferol) 50000 units Caps capsule Commonly known as:  DRISDOL Take 1 capsule (50,000 Units total) by mouth every 7 (seven) days.   ZENPEP 10000 units Cpep Generic drug:  Pancrelipase (Lip-Prot-Amyl) TAKE 1 CAPSULE BY MOUTH THREE TIMES DAILY BEFORE MEALS       Allergies:  Allergies  Allergen Reactions  . Enalapril Maleate Cough    Past Medical History:  Diagnosis Date  . Arthritis    lower back  . Chronic back pain    Disc disease  . Chronic kidney disease   . Coronary atherosclerosis of native coronary artery    Coronary calcifications by chest  CT, Myoview demonstrating inferolateral scar  . Deafness in right ear   . Diabetes mellitus   . Diastolic dysfunction 08/6008   Grade 1.  . Essential hypertension, benign   .  Heart murmur    in past  . HOH (hard of hearing)   . Hyperkalemia   . Kidney stones   . Mixed hyperlipidemia   . Pancreatic insufficiency    Diagnosed at [facility]  . Prostate cancer ([place]) 2011  . Shortness of breath dyspnea    occasional - liver pressing on right lung, decreased capacity  . Subdural hematoma ([place])   . Type 2 diabetes mellitus ([place])   . Wears hearing aid    "crossover" form right to left    Past Surgical History:  Procedure Laterality Date  . APPENDECTOMY    . Bilateral hip replacement    . CATARACT EXTRACTION W/PHACO Left 05/15/2015   Procedure: CATARACT EXTRACTION PHACO AND INTRAOCULAR LENS PLACEMENT (IOC);  Surgeon: Ronnell Freshwater, MD;  Location: [place];  Service: Ophthalmology;  Laterality: Left;  DIABETIC - insulin pump and oral meds  . CATARACT EXTRACTION W/PHACO Right 08/28/2015   Procedure: CATARACT EXTRACTION PHACO AND INTRAOCULAR LENS PLACEMENT (IOC);  Surgeon: Ronnell Freshwater, MD;  Location: [place];  Service: Ophthalmology;  Laterality: Right;  DIABETIC PER PT AND CINDY PLEASE KEEP ARRIVAL TIME AFTER 8AM   . CHOLECYSTECTOMY    . HERNIA REPAIR    . PROSTATECTOMY  2011    Family History  Problem Relation Age of Onset  . Heart attack Father   . Diabetes type II Mother     Social History:  reports that he has never smoked. He has never used smokeless tobacco. He reports that he does not drink alcohol or use drugs.    Review of Systems    Lipid history: On 11m Pravastatin from PCP for Cardiovascular prophylaxis    Lab Results  Component Value Date   CHOL 97 02/22/2016   HDL 30.70 (L) 02/22/2016   LDLCALC 41 02/22/2016   TRIG 126.0 02/22/2016   CHOLHDL 3 02/22/2016            Most recent eye exam was in 9/16, apparently no  retinopathy   Hypertension: , treated with carvedilol 25 mg, valsartan 160 mg and doxazosin, Followed by PCP  Also has RENAL dysfunction      He is taking gabapentin for his muscle cramps and not neuropathy  Endocrine: Hypogonadism has been present for at least a few years but his urologist has not wanted to treat him because of history of prostate cancer treated surgically several years ago  His last level:  Lab Results  Component Value Date   TESTOSTERONE 255 (L) 02/28/2015     Physical Examination:  BP 124/76   Pulse 68   Temp 98.1 F (36.7 C)   Ht 5' 8"  (1.727 m)   Wt 213 lb 9.6 oz (96.9 kg)   SpO2 98%   BMI 32.48 kg/m    ASSESSMENT:  Diabetes type 2, uncontrolled    See history of present illness for detailed discussion of his current management, blood sugar patterns and problems identified  Currently with taking NPH and regular insulin With overall fairly good control and A1c 7.1 However his blood sugars are tending to be significantly high before lunch with his cutting back on his own on his NPH in the morning Discussed that he should not be taking arbitrary doses of insulin Also skipping NPH insulin at bedtime with low normal sugars although does not appear to have high readings next day He tries to adjust the NPH dose based on bedtime glucose  Probably getting too many low blood sugars  overall including some fasting, sometimes in the afternoon when he is very active and some low normal readings at bedtime also This may be causing his A1c to be lower than before Otherwise seemed to be doing fairly well with diet but does not always get protein at breakfast  PLAN:   Discussed following instructions given on the office visit for the insulin   We will increase the morning NPH by at least 2 units that lunchtime readings are not as high and check these more often  More protein at breakfast  He probably needs 2-3 units Lantus regular insulin at lunch if he is  planning to be active in the afternoon  Take a snack before going out to do yardwork  Reduce bedtime NPH to 8 units on a regular basis to avoid nocturnal hypoglycemia  More readings 2 hours after breakfast and lunch  Consider further diabetes education   Counseling time on subjects discussed above is over 50% of today's 25 minute visit  Patient Instructions  8 Units N at BEDTIME  N insulin 12 units in am  Snack before yardwork in ams  If going to be active after lunch reduce lunch dose by 3 units  Regular insulin 10 in am and 8-10 at supper based on meal size  Check blood sugars on waking up  4x per week  Also check blood sugars about 2 hours after a meal and do this after different meals by rotation  Recommended blood sugar levels on waking up is 90-130 and about 2 hours after meal is 130-160  Please bring your blood sugar monitor to each visit, thank you    [facility] 05/24/2016, 2:00 PM   Note: This office note was prepared with Dragon voice recognition system technology. Any transcriptional errors that result from this process are unintentional.

## 2016-05-27 ENCOUNTER — Encounter: Payer: Self-pay | Admitting: Family Medicine

## 2016-05-27 ENCOUNTER — Ambulatory Visit (INDEPENDENT_AMBULATORY_CARE_PROVIDER_SITE_OTHER): Payer: PPO | Admitting: Family Medicine

## 2016-05-27 VITALS — BP 150/82 | HR 64 | Temp 97.4°F | Resp 18 | Ht 69.0 in | Wt 212.0 lb

## 2016-05-27 DIAGNOSIS — Z8546 Personal history of malignant neoplasm of prostate: Secondary | ICD-10-CM

## 2016-05-27 DIAGNOSIS — I10 Essential (primary) hypertension: Secondary | ICD-10-CM | POA: Diagnosis not present

## 2016-05-27 DIAGNOSIS — E119 Type 2 diabetes mellitus without complications: Secondary | ICD-10-CM

## 2016-05-27 DIAGNOSIS — N183 Chronic kidney disease, stage 3 unspecified: Secondary | ICD-10-CM

## 2016-05-27 DIAGNOSIS — Z23 Encounter for immunization: Secondary | ICD-10-CM

## 2016-05-27 DIAGNOSIS — I2510 Atherosclerotic heart disease of native coronary artery without angina pectoris: Secondary | ICD-10-CM | POA: Diagnosis not present

## 2016-05-27 DIAGNOSIS — Z794 Long term (current) use of insulin: Secondary | ICD-10-CM

## 2016-05-27 DIAGNOSIS — IMO0001 Reserved for inherently not codable concepts without codable children: Secondary | ICD-10-CM

## 2016-05-27 NOTE — Progress Notes (Signed)
Subjective:    Patient ID: Mark Hose., male    DOB: April 07, 1941, 75 y.o.   MRN: [id number]  HPI  12/2015 Patient's blood pressures have been much better controlled at home recently. He brings in several different numbers over the last week. Blood pressure ranges from 108-144/60-75. All but one blood pressure is less than 120. He does report hypersomnia and dry mouth on clonidine. He is also on another alpha-blocker in [place]. Will maintain this combination due to his difficult to control blood pressure but it seems like his blood pressure has been much better controlled recently. He denies any chest pain shortness of breath or dyspnea on exertion. He denies any peripheral edema. He is overdue for lab work. He follows up regularly with his endocrinologist who is monitoring his sugar control. He is not due for hepatitis C screening and his immunizations are up-to-date.  AT that time, my plan was: Blood pressure for him is outstanding. Due to the side effects and will have the patient discontinue clonidine and notify me of his blood pressures in 2 weeks. He is only taking one dose of clonidine at night and therefore I do not feel that we need to wean him down. Immunizations are up-to-date. Hemoglobin A1c is monitored by his endocrinologist. Patient is not fasting and therefore I cannot check fasting lipid panel today  05/27/16 Patient is here today for follow-up. He is working with his endocrinologist and his A1c is the best of his ever been. It is down to 7.1 and I am extremely proud of the patient for this. He is due today for his flu shot. His other immunizations are up-to-date. His blood pressure here today in office is elevated however he states that he is monitoring it at home and it is typically 673 systolic to 419/37. He denies any chest pain shortness of breath or dyspnea on exertion. He is not fasting but he is overdue for lab work to check his cholesterol given his history of ASCVD. His  goal LDL cholesterol is less than 70. He also has a history of prostate cancer status post treatment 6 years ago. He is due for his anal PSA to monitor for recurrence   Past Medical History:  Diagnosis Date  . Arthritis    lower back  . Chronic back pain    Disc disease  . Chronic kidney disease   . Coronary atherosclerosis of native coronary artery    Coronary calcifications by chest CT, Myoview demonstrating inferolateral scar  . Deafness in right ear   . Diabetes mellitus   . Diastolic dysfunction 04/239   Grade 1.  . Essential hypertension, benign   . Heart murmur    in past  . HOH (hard of hearing)   . Hyperkalemia   . Kidney stones   . Mixed hyperlipidemia   . Pancreatic insufficiency    Diagnosed at [facility]  . Prostate cancer ([place]) 2011  . Shortness of breath dyspnea    occasional - liver pressing on right lung, decreased capacity  . Subdural hematoma ([place])   . Type 2 diabetes mellitus ([place])   . Wears hearing aid    "crossover" form right to left   Past Surgical History:  Procedure Laterality Date  . APPENDECTOMY    . Bilateral hip replacement    . CATARACT EXTRACTION W/PHACO Left 05/15/2015   Procedure: CATARACT EXTRACTION PHACO AND INTRAOCULAR LENS PLACEMENT (IOC);  Surgeon: Ronnell Freshwater, MD;  Location: [facility];  Service: Ophthalmology;  Laterality: Left;  DIABETIC - insulin pump and oral meds  . CATARACT EXTRACTION W/PHACO Right 08/28/2015   Procedure: CATARACT EXTRACTION PHACO AND INTRAOCULAR LENS PLACEMENT (IOC);  Surgeon: Ronnell Freshwater, MD;  Location: [facility];  Service: Ophthalmology;  Laterality: Right;  DIABETIC PER PT AND CINDY PLEASE KEEP ARRIVAL TIME AFTER 8AM   . CHOLECYSTECTOMY    . HERNIA REPAIR    . PROSTATECTOMY  2011   Current Outpatient Prescriptions on File Prior to Visit  Medication Sig Dispense Refill  . aspirin EC 81 MG tablet Take 81 mg by mouth at bedtime.     . carvedilol (COREG) 25 MG tablet  TAKE ONE TABLET BY MOUTH TWICE DAILY 180 tablet 3  . citalopram (CELEXA) 20 MG tablet TAKE ONE TABLET BY MOUTH ONCE DAILY 90 tablet 4  . diazepam (VALIUM) 5 MG tablet TAKE ONE TABLET BY MOUTH TWICE DAILY AS NEEDED 60 tablet 0  . doxazosin (CARDURA) 8 MG tablet Take 1 tablet (8 mg total) by mouth daily. 90 tablet 3  . FREESTYLE LITE test strip USE ONE STRIP TO CHECK GLUCOSE THREE TIMES DAILY 300 each 1  . furosemide (LASIX) 20 MG tablet TAKE ONE TABLET BY MOUTH ONCE DAILY 90 tablet 0  . gabapentin (NEURONTIN) 300 MG capsule Take 2 capsules (600 mg total) by mouth at bedtime. 180 capsule 3  . insulin NPH Human (HUMULIN N,NOVOLIN N) 100 UNIT/ML injection 10u qam - 10 u qhs    . insulin regular (NOVOLIN R,HUMULIN R) 100 units/mL injection 10 u qam and 8-10 u at dinner    . Multiple Vitamin (MULTIVITAMIN WITH MINERALS) TABS Take 1 tablet by mouth daily.    . pravastatin (PRAVACHOL) 10 MG tablet TAKE ONE TABLET BY MOUTH ONCE DAILY AT BEDTIME 90 tablet 3  . traMADol (ULTRAM) 50 MG tablet TAKE ONE TABLET BY MOUTH EVERY 8 HOURS AS NEEDED 30 tablet 0  . valsartan (DIOVAN) 160 MG tablet Take 1 tablet (160 mg total) by mouth daily. 90 tablet 3  . Vitamin D, Ergocalciferol, (DRISDOL) 50000 UNITS CAPS capsule Take 1 capsule (50,000 Units total) by mouth every 7 (seven) days. 4 capsule 6  . ZENPEP 10000 units CPEP TAKE 1 CAPSULE BY MOUTH THREE TIMES DAILY BEFORE MEALS 90 capsule 3   No current facility-administered medications on file prior to visit.    Allergies  Allergen Reactions  . Enalapril Maleate Cough   Social History   Social History  . Marital status: Married    Spouse name: N/A  . Number of children: N/A  . Years of education: N/A   Occupational History  . Not on file.   Social History Main Topics  . Smoking status: Never Smoker  . Smokeless tobacco: Never Used  . Alcohol use No  . Drug use: No  . Sexual activity: Not on file   Other Topics Concern  . Not on file   Social  History Narrative  . No narrative on file      Review of Systems  All other systems reviewed and are negative.      Objective:   Physical Exam  Constitutional: He appears well-developed and well-nourished. No distress.  Neck: Neck supple. No JVD present. No thyromegaly present.  Cardiovascular: Normal rate, regular rhythm and normal heart sounds.  Exam reveals no gallop and no friction rub.   No murmur heard. Pulmonary/Chest: Effort normal and breath sounds normal. No respiratory distress. He has no wheezes. He has no  rales.  Abdominal: Soft. Bowel sounds are normal. He exhibits no distension. There is no tenderness. There is no rebound and no guarding.  Musculoskeletal: He exhibits no edema.  Lymphadenopathy:    He has no cervical adenopathy.  Skin: He is not diaphoretic.  Vitals reviewed.         Assessment & Plan:  Benign essential HTN - Plan: CBC with Differential/Platelet, COMPLETE METABOLIC PANEL WITH GFR, LDL cholesterol, direct  History of prostate cancer - Plan: PSA  CKD (chronic kidney disease) stage 3, GFR 30-59 ml/min  IDDM (insulin dependent diabetes mellitus) (HCC)  ASCVD (arteriosclerotic cardiovascular disease)  I will check the patient's PSA to monitor for recurrence of his prostate cancer. His home blood pressures are well controlled. I will make no changes in his blood pressure medication. His diabetes is being well managed by his endocrinologist and I'm very proud of the patient for control is achieved. I continue to encourage diet exercise and weight loss. He continues to complain of more more arthritic pain. He is taking tramadol 2 times a day. I recommended combining that with the thousand milligrams of Tylenol twice a day. He must avoid NSAIDs due to his chronic kidney disease.

## 2016-05-27 NOTE — Addendum Note (Signed)
Addended by: Shary Decamp B on: 05/27/2016 02:45 PM   Modules accepted: Orders

## 2016-05-28 ENCOUNTER — Encounter: Payer: Self-pay | Admitting: Family Medicine

## 2016-05-28 LAB — CBC WITH DIFFERENTIAL/PLATELET
Basophils Absolute: 63 cells/uL (ref 0–200)
Basophils Relative: 1 %
EOS PCT: 4 %
Eosinophils Absolute: 252 cells/uL (ref 15–500)
HCT: 40.4 % (ref 38.5–50.0)
Hemoglobin: 12.8 g/dL — ABNORMAL LOW (ref 13.0–17.0)
Lymphocytes Relative: 28 %
Lymphs Abs: 1764 cells/uL (ref 850–3900)
MCH: 30.1 pg (ref 27.0–33.0)
MCHC: 31.7 g/dL — AB (ref 32.0–36.0)
MCV: 95.1 fL (ref 80.0–100.0)
MPV: 9.7 fL (ref 7.5–12.5)
Monocytes Absolute: 567 cells/uL (ref 200–950)
Monocytes Relative: 9 %
NEUTROS ABS: 3654 {cells}/uL (ref 1500–7800)
NEUTROS PCT: 58 %
Platelets: 167 10*3/uL (ref 140–400)
RBC: 4.25 MIL/uL (ref 4.20–5.80)
RDW: 13.7 % (ref 11.0–15.0)
WBC: 6.3 10*3/uL (ref 3.8–10.8)

## 2016-05-28 LAB — COMPLETE METABOLIC PANEL WITH GFR
ALBUMIN: 3.9 g/dL (ref 3.6–5.1)
ALK PHOS: 104 U/L (ref 40–115)
ALT: 31 U/L (ref 9–46)
AST: 28 U/L (ref 10–35)
BUN: 33 mg/dL — AB (ref 7–25)
CHLORIDE: 105 mmol/L (ref 98–110)
CO2: 27 mmol/L (ref 20–31)
Calcium: 8.7 mg/dL (ref 8.6–10.3)
Creat: 1.29 mg/dL — ABNORMAL HIGH (ref 0.70–1.18)
GFR, Est African American: 62 mL/min (ref >=60)
GFR, Est Non African American: 54 mL/min — ABNORMAL LOW (ref >=60)
GLUCOSE: 139 mg/dL — AB (ref 70–99)
POTASSIUM: 4.7 mmol/L (ref 3.5–5.3)
SODIUM: 139 mmol/L (ref 135–146)
Total Bilirubin: 0.5 mg/dL (ref 0.2–1.2)
Total Protein: 6.5 g/dL (ref 6.1–8.1)

## 2016-05-28 LAB — LDL CHOLESTEROL, DIRECT: Direct LDL: 54 mg/dL (ref <130)

## 2016-05-28 LAB — PSA: PSA: 0.1 ng/mL (ref <=4.0)

## 2016-06-07 ENCOUNTER — Encounter: Payer: PPO | Admitting: Family Medicine

## 2016-06-07 ENCOUNTER — Encounter: Payer: Self-pay | Admitting: Family Medicine

## 2016-06-10 NOTE — Progress Notes (Signed)
This encounter was created in error - please disregard.

## 2016-06-13 ENCOUNTER — Encounter: Payer: Self-pay | Admitting: Family Medicine

## 2016-06-14 ENCOUNTER — Encounter: Payer: Self-pay | Admitting: Family Medicine

## 2016-06-14 ENCOUNTER — Ambulatory Visit (INDEPENDENT_AMBULATORY_CARE_PROVIDER_SITE_OTHER): Payer: PPO | Admitting: Family Medicine

## 2016-06-14 VITALS — BP 132/70 | HR 80 | Temp 98.5°F | Resp 18 | Ht 69.0 in | Wt 213.0 lb

## 2016-06-14 DIAGNOSIS — N451 Epididymitis: Secondary | ICD-10-CM

## 2016-06-14 MED ORDER — DOXYCYCLINE HYCLATE 100 MG PO TABS: 100.0000 mg | ORAL_TABLET | Freq: Two times a day (BID) | ORAL | 0 refills | Status: DC

## 2016-06-14 NOTE — Progress Notes (Signed)
Subjective:    Patient ID: Mark Hose., male    DOB: 1941/05/23, 75 y.o.   MRN: [id number]  HPI Reports 1 week of L testicular pain.  Sore around epididymis.  Swollen.  Denies dysuria.  Denies injury.  Denies fever. Past Medical History:  Diagnosis Date  . Arthritis    lower back  . Chronic back pain    Disc disease  . Chronic kidney disease   . Coronary atherosclerosis of native coronary artery    Coronary calcifications by chest CT, Myoview demonstrating inferolateral scar  . Deafness in right ear   . Diabetes mellitus   . Diastolic dysfunction 11/3327   Grade 1.  . Essential hypertension, benign   . Heart murmur    in past  . HOH (hard of hearing)   . Hyperkalemia   . Kidney stones   . Mixed hyperlipidemia   . Pancreatic insufficiency    Diagnosed at [facility]  . Prostate cancer ([place]) 2011  . Shortness of breath dyspnea    occasional - liver pressing on right lung, decreased capacity  . Subdural hematoma ([place])   . Type 2 diabetes mellitus ([place])   . Wears hearing aid    "crossover" form right to left   Past Surgical History:  Procedure Laterality Date  . APPENDECTOMY    . Bilateral hip replacement    . CATARACT EXTRACTION W/PHACO Left 05/15/2015   Procedure: CATARACT EXTRACTION PHACO AND INTRAOCULAR LENS PLACEMENT (IOC);  Surgeon: Ronnell Freshwater, MD;  Location: [facility];  Service: Ophthalmology;  Laterality: Left;  DIABETIC - insulin pump and oral meds  . CATARACT EXTRACTION W/PHACO Right 08/28/2015   Procedure: CATARACT EXTRACTION PHACO AND INTRAOCULAR LENS PLACEMENT (IOC);  Surgeon: Ronnell Freshwater, MD;  Location: [facility];  Service: Ophthalmology;  Laterality: Right;  DIABETIC PER PT AND CINDY PLEASE KEEP ARRIVAL TIME AFTER 8AM   . CHOLECYSTECTOMY    . HERNIA REPAIR    . PROSTATECTOMY  2011   Current Outpatient Prescriptions on File Prior to Visit  Medication Sig Dispense Refill  . aspirin EC 81 MG tablet Take 81  mg by mouth at bedtime.     . carvedilol (COREG) 25 MG tablet TAKE ONE TABLET BY MOUTH TWICE DAILY 180 tablet 3  . citalopram (CELEXA) 20 MG tablet TAKE ONE TABLET BY MOUTH ONCE DAILY 90 tablet 4  . diazepam (VALIUM) 5 MG tablet TAKE ONE TABLET BY MOUTH TWICE DAILY AS NEEDED 60 tablet 0  . doxazosin (CARDURA) 8 MG tablet Take 1 tablet (8 mg total) by mouth daily. 90 tablet 3  . FREESTYLE LITE test strip USE ONE STRIP TO CHECK GLUCOSE THREE TIMES DAILY 300 each 1  . furosemide (LASIX) 20 MG tablet TAKE ONE TABLET BY MOUTH ONCE DAILY 90 tablet 0  . gabapentin (NEURONTIN) 300 MG capsule Take 2 capsules (600 mg total) by mouth at bedtime. 180 capsule 3  . insulin NPH Human (HUMULIN N,NOVOLIN N) 100 UNIT/ML injection 10u qam - 10 u qhs    . insulin regular (NOVOLIN R,HUMULIN R) 100 units/mL injection 10 u qam and 8-10 u at dinner    . Multiple Vitamin (MULTIVITAMIN WITH MINERALS) TABS Take 1 tablet by mouth daily.    . pravastatin (PRAVACHOL) 10 MG tablet TAKE ONE TABLET BY MOUTH ONCE DAILY AT BEDTIME 90 tablet 3  . traMADol (ULTRAM) 50 MG tablet TAKE ONE TABLET BY MOUTH EVERY 8 HOURS AS NEEDED 30 tablet 0  . valsartan (  DIOVAN) 160 MG tablet Take 1 tablet (160 mg total) by mouth daily. 90 tablet 3  . Vitamin D, Ergocalciferol, (DRISDOL) 50000 UNITS CAPS capsule Take 1 capsule (50,000 Units total) by mouth every 7 (seven) days. 4 capsule 6  . ZENPEP 10000 units CPEP TAKE 1 CAPSULE BY MOUTH THREE TIMES DAILY BEFORE MEALS 90 capsule 3   No current facility-administered medications on file prior to visit.    Allergies  Allergen Reactions  . Enalapril Maleate Cough   Social History   Social History  . Marital status: Married    Spouse name: N/A  . Number of children: N/A  . Years of education: N/A   Occupational History  . Not on file.   Social History Main Topics  . Smoking status: Never Smoker  . Smokeless tobacco: Never Used  . Alcohol use No  . Drug use: No  . Sexual activity: Not on  file   Other Topics Concern  . Not on file   Social History Narrative  . No narrative on file      Review of Systems  All other systems reviewed and are negative.      Objective:   Physical Exam  Cardiovascular: Normal rate, regular rhythm and normal heart sounds.   Pulmonary/Chest: Effort normal and breath sounds normal.  Genitourinary: Left testis shows swelling and tenderness.  Vitals reviewed.  Swollen Left epididymis       Assessment & Plan:  Epididymitis - Plan: doxycycline (VIBRA-TABS) 100 MG tablet, DISCONTINUED: doxycycline (VIBRA-TABS) 100 MG tablet  I suspect epididymitis. Begin doxycycline 100 mg by mouth twice a day for 10 days. Recheck next week if no better or sooner if worse.

## 2016-06-14 NOTE — Telephone Encounter (Signed)
Called pt and appt made

## 2016-06-16 ENCOUNTER — Encounter: Payer: Self-pay | Admitting: Endocrinology

## 2016-06-24 ENCOUNTER — Telehealth: Payer: Self-pay | Admitting: Endocrinology

## 2016-06-24 NOTE — Telephone Encounter (Signed)
We are out, left detailed message.

## 2016-06-24 NOTE — Telephone Encounter (Signed)
Patient is having trouble with his current meter. And asking for a FREESTYLE LITE test strip he want to come pick it up let him know when it is ready

## 2016-06-26 ENCOUNTER — Ambulatory Visit: Payer: Self-pay | Admitting: Family Medicine

## 2016-07-01 ENCOUNTER — Encounter: Payer: Self-pay | Admitting: Family Medicine

## 2016-07-02 ENCOUNTER — Ambulatory Visit (INDEPENDENT_AMBULATORY_CARE_PROVIDER_SITE_OTHER): Payer: PPO | Admitting: Sports Medicine

## 2016-07-02 ENCOUNTER — Encounter: Payer: Self-pay | Admitting: Sports Medicine

## 2016-07-02 ENCOUNTER — Other Ambulatory Visit: Payer: Self-pay | Admitting: Family Medicine

## 2016-07-02 DIAGNOSIS — B351 Tinea unguium: Secondary | ICD-10-CM | POA: Diagnosis not present

## 2016-07-02 DIAGNOSIS — E1140 Type 2 diabetes mellitus with diabetic neuropathy, unspecified: Secondary | ICD-10-CM

## 2016-07-02 DIAGNOSIS — M79674 Pain in right toe(s): Secondary | ICD-10-CM | POA: Diagnosis not present

## 2016-07-02 DIAGNOSIS — M79675 Pain in left toe(s): Secondary | ICD-10-CM

## 2016-07-02 MED ORDER — PANCRELIPASE (LIP-PROT-AMYL) 25000 UNITS PO CPEP: 1.0000 | ORAL_CAPSULE | Freq: Three times a day (TID) | ORAL | 5 refills | Status: DC

## 2016-07-02 NOTE — Telephone Encounter (Signed)
ok 

## 2016-07-02 NOTE — Telephone Encounter (Signed)
Med sent to pharm and pt aware

## 2016-07-02 NOTE — Telephone Encounter (Signed)
Ok to refill Diazepam and Tramadol?

## 2016-07-02 NOTE — Progress Notes (Signed)
Subjective: Mark L Ripple Sr. is a 75 y.o. male patient with history of diabetes who returns to office today complaining of long, painful nails while ambulating in shoes; unable to trim. Patient states that the glucose reading this morning was not recorded but last A1c 7.1. Patient denies any new changes in medication or new problems. Patient denies any new cramping, numbness, burning or tingling in the legs. Admits to baseline numbness right>left and is still awaiting diabetic shoes.  Patient Active Problem List   Diagnosis Date Noted  . ED (erectile dysfunction) 01/05/2014  . Shortness of breath 12/05/2011  . Coronary atherosclerosis of native coronary artery 12/05/2011  . Type 2 diabetes mellitus ([place]) 12/05/2011  . Essential hypertension, benign 12/05/2011  . Mixed hyperlipidemia 12/05/2011  . Malignant neoplasm of prostate ([place]) 06/26/2011   Current Outpatient Prescriptions on File Prior to Visit  Medication Sig Dispense Refill  . aspirin EC 81 MG tablet Take 81 mg by mouth at bedtime.     . carvedilol (COREG) 25 MG tablet TAKE ONE TABLET BY MOUTH TWICE DAILY 180 tablet 3  . citalopram (CELEXA) 20 MG tablet TAKE ONE TABLET BY MOUTH ONCE DAILY 90 tablet 4  . diazepam (VALIUM) 5 MG tablet TAKE ONE TABLET BY MOUTH TWICE DAILY AS NEEDED 60 tablet 0  . doxazosin (CARDURA) 8 MG tablet Take 1 tablet (8 mg total) by mouth daily. 90 tablet 3  . doxycycline (VIBRA-TABS) 100 MG tablet Take 1 tablet (100 mg total) by mouth 2 (two) times daily. 20 tablet 0  . FREESTYLE LITE test strip USE ONE STRIP TO CHECK GLUCOSE THREE TIMES DAILY 300 each 1  . furosemide (LASIX) 20 MG tablet TAKE ONE TABLET BY MOUTH ONCE DAILY 90 tablet 0  . gabapentin (NEURONTIN) 300 MG capsule Take 2 capsules (600 mg total) by mouth at bedtime. 180 capsule 3  . insulin NPH Human (HUMULIN N,NOVOLIN N) 100 UNIT/ML injection 10u qam - 10 u qhs    . insulin regular (NOVOLIN R,HUMULIN R) 100 units/mL injection 10 u qam and 8-10 u  at dinner    . Multiple Vitamin (MULTIVITAMIN WITH MINERALS) TABS Take 1 tablet by mouth daily.    . Pancrelipase, Lip-Prot-Amyl, 25000 units CPEP Take 1 tablet by mouth 3 (three) times daily. Please disregard this rx and previous sent to wrong pharmacy 90 capsule 5  . pravastatin (PRAVACHOL) 10 MG tablet TAKE ONE TABLET BY MOUTH ONCE DAILY AT BEDTIME 90 tablet 3  . traMADol (ULTRAM) 50 MG tablet TAKE ONE TABLET BY MOUTH EVERY 8 HOURS AS NEEDED 30 tablet 0  . valsartan (DIOVAN) 160 MG tablet Take 1 tablet (160 mg total) by mouth daily. 90 tablet 3  . Vitamin D, Ergocalciferol, (DRISDOL) 50000 UNITS CAPS capsule Take 1 capsule (50,000 Units total) by mouth every 7 (seven) days. 4 capsule 6   No current facility-administered medications on file prior to visit.    Allergies  Allergen Reactions  . Enalapril Maleate Cough    Recent Results (from the past 2160 hour(s))  POCT HgB A1C     Status: Abnormal   Collection Time: 05/24/16  1:43 PM  Result Value Ref Range   Hemoglobin A1C 7.1   CBC with Differential/Platelet     Status: Abnormal   Collection Time: 05/27/16  2:21 PM  Result Value Ref Range   WBC 6.3 3.8 - 10.8 K/uL   RBC 4.25 4.20 - 5.80 MIL/uL   Hemoglobin 12.8 (L) 13.0 - 17.0 g/dL   HCT  40.4 38.5 - 50.0 %   MCV 95.1 80.0 - 100.0 fL   MCH 30.1 27.0 - 33.0 pg   MCHC 31.7 (L) 32.0 - 36.0 g/dL   RDW 13.7 11.0 - 15.0 %   Platelets 167 140 - 400 K/uL   MPV 9.7 7.5 - 12.5 fL   Neutro Abs 3,654 1,500 - 7,800 cells/uL   Lymphs Abs 1,764 850 - 3,900 cells/uL   Monocytes Absolute 567 200 - 950 cells/uL   Eosinophils Absolute 252 15 - 500 cells/uL   Basophils Absolute 63 0 - 200 cells/uL   Neutrophils Relative % 58 %   Lymphocytes Relative 28 %   Monocytes Relative 9 %   Eosinophils Relative 4 %   Basophils Relative 1 %   Smear Review Criteria for review not met   COMPLETE METABOLIC PANEL WITH GFR     Status: Abnormal   Collection Time: 05/27/16  2:21 PM  Result Value Ref Range    Sodium 139 135 - 146 mmol/L   Potassium 4.7 3.5 - 5.3 mmol/L   Chloride 105 98 - 110 mmol/L   CO2 27 20 - 31 mmol/L   Glucose, Bld 139 (H) 70 - 99 mg/dL   BUN 33 (H) 7 - 25 mg/dL   Creat 1.29 (H) 0.70 - 1.18 mg/dL    Comment:   For patients > or = 75 years of age: The upper reference limit for Creatinine is approximately 13% higher for people identified as African-American.      Total Bilirubin 0.5 0.2 - 1.2 mg/dL   Alkaline Phosphatase 104 40 - 115 U/L   AST 28 10 - 35 U/L   ALT 31 9 - 46 U/L   Total Protein 6.5 6.1 - 8.1 g/dL   Albumin 3.9 3.6 - 5.1 g/dL   Calcium 8.7 8.6 - 10.3 mg/dL   GFR, Est African American 62 >=60 mL/min   GFR, Est Non African American 54 (L) >=60 mL/min  PSA     Status: None   Collection Time: 05/27/16  2:21 PM  Result Value Ref Range   PSA 0.1 <=4.0 ng/mL    Comment:   The total PSA value from this assay system is standardized against the WHO standard. The test result will be approximately 20% lower when compared to the equimolar-standardized total PSA (Beckman Coulter). Comparison of serial PSA results should be interpreted with this fact in mind.   This test was performed using the Siemens chemiluminescent method. Values obtained from different assay methods cannot be used interchangeably. PSA levels, regardless of value, should not be interpreted as absolute evidence of the presence or absence of disease.   Effective March 11, 2016, Total PSA is being tested on the Siemens Centaur XP using chemiluminescence methodology. Re-baseline testing will be available until June 10, 2016 at no charge. If you have a patient that may require re-baselining, please order [id number] in addition to [id number].   LDL cholesterol, direct     Status: None   Collection Time: 05/27/16  2:21 PM  Result Value Ref Range   Direct LDL 54 <130 mg/dL    Comment:   Desirable range <100 mg/dL for patients with CHD or diabetes and <70 mg/dL for diabetic patients with  known heart disease.       Objective: General: Patient is awake, alert, and oriented x 3 and in no acute distress.  Integument: Skin is warm, dry and supple bilateral. Nails are tender, long, thickened and dystrophic with subungual debris,  consistent with onychomycosis, 1-5 bilateral. No signs of infection. No open lesions or preulcerative lesions present bilateral. Diffuse dry callus skin to heels. Remaining integument unremarkable.  Vasculature:  Dorsalis Pedis pulse 1/4 bilateral. Posterior Tibial pulse  1/4 bilateral. Capillary fill time <3 sec 1-5 bilateral. Positive hair growth to the level of the digits.Temperature gradient within normal limits. No varicosities present bilateral. No edema present bilateral.   Neurology: The patient has intact sensation measured with a 5.07/10g Semmes Weinstein Monofilament at all pedal sites bilateral . Vibratory sensation diminished bilateral with tuning fork. No Babinski sign present bilateral.   Musculoskeletal: Asymptomatic mild hammertoe pedal deformities noted bilateral. Muscular strength 5/5 in all lower extremity muscular groups bilateral without pain on range of motion . No tenderness with calf compression bilateral.  Assessment and Plan: Problem List Items Addressed This Visit    None    Visit Diagnoses    Dermatophytosis of nail    -  Primary   Toe pain, bilateral       Type 2 diabetes mellitus with diabetic neuropathy, unspecified long term insulin use status ([place])          -Examined patient. -Discussed and educated patient on diabetic foot care, especially with regards to the vascular, neurological and musculoskeletal systems.  -Stressed the importance of good glycemic control and the detriment of not controlling glucose levels in relation to the foot. -Mechanically debrided all nails 1-5 bilateral using sterile nail nipper and filed with dremel without incident  -Safe step diabetic shoe order form to be sent to PCP Dr.Pickard for  certification -Answered all patient questions -Patient to return  in 3 months for at risk foot care -Patient advised to call the office if any problems or questions arise in the meantime.  Landis Martins, DPM

## 2016-07-03 MED ORDER — PANCRELIPASE (LIP-PROT-AMYL) 25000 UNITS PO CPEP: 1.0000 | ORAL_CAPSULE | Freq: Three times a day (TID) | ORAL | 5 refills | Status: DC

## 2016-07-03 NOTE — Addendum Note (Signed)
Addended by: Shary Decamp B on: 07/03/2016 01:06 PM   Modules accepted: Orders

## 2016-07-03 NOTE — Telephone Encounter (Signed)
Medication refilled per protocol. 

## 2016-07-17 ENCOUNTER — Other Ambulatory Visit: Payer: Self-pay | Admitting: Family Medicine

## 2016-07-18 MED ORDER — TRAMADOL HCL 50 MG PO TABS: 50.0000 mg | ORAL_TABLET | Freq: Three times a day (TID) | ORAL | 1 refills | Status: DC | PRN

## 2016-08-05 ENCOUNTER — Other Ambulatory Visit: Payer: Self-pay | Admitting: Family Medicine

## 2016-08-06 ENCOUNTER — Telehealth: Payer: Self-pay | Admitting: *Deleted

## 2016-08-06 NOTE — Telephone Encounter (Signed)
Received fax requesting refill on Tramadol.   Ok to refill?

## 2016-08-06 NOTE — Telephone Encounter (Signed)
Ok to refill 

## 2016-08-06 NOTE — Telephone Encounter (Signed)
ok 

## 2016-08-06 NOTE — Telephone Encounter (Signed)
Medication called to pharmacy. 

## 2016-08-21 ENCOUNTER — Ambulatory Visit: Payer: PPO | Admitting: Endocrinology

## 2016-08-27 ENCOUNTER — Encounter: Payer: Self-pay | Admitting: Family Medicine

## 2016-08-27 ENCOUNTER — Ambulatory Visit (INDEPENDENT_AMBULATORY_CARE_PROVIDER_SITE_OTHER): Payer: PPO | Admitting: Family Medicine

## 2016-08-27 VITALS — BP 124/78 | HR 78 | Temp 98.6°F | Resp 16 | Ht 69.0 in | Wt 212.0 lb

## 2016-08-27 DIAGNOSIS — Z8546 Personal history of malignant neoplasm of prostate: Secondary | ICD-10-CM

## 2016-08-27 LAB — PSA: PSA: 0.1 ng/mL (ref <=4.0)

## 2016-08-27 MED ORDER — FUROSEMIDE 20 MG PO TABS: 20.0000 mg | ORAL_TABLET | Freq: Every day | ORAL | 1 refills | Status: DC

## 2016-08-27 MED ORDER — HYDROCODONE-ACETAMINOPHEN 5-325 MG PO TABS: 1.0000 | ORAL_TABLET | Freq: Four times a day (QID) | ORAL | 0 refills | Status: DC | PRN

## 2016-08-27 MED ORDER — VALSARTAN 160 MG PO TABS: 160.0000 mg | ORAL_TABLET | Freq: Every day | ORAL | 3 refills | Status: DC

## 2016-08-27 NOTE — Progress Notes (Signed)
Subjective:    Patient ID: [name]., male    DOB: [birth year], 76 y.o.   MRN: [id number]  HPI Patient has a history of prostate cancer. His previous PSAs have been undetectable at 0.01. Recently increased to 0.1. He is here today to recheck it. He denies any difficulty urinating. He denies any hesitancy or urgency. Denies any testicular pain Past Medical History:  Diagnosis Date  . Arthritis    lower back  . Chronic back pain    Disc disease  . Chronic kidney disease   . Coronary atherosclerosis of native coronary artery    Coronary calcifications by chest CT, Myoview demonstrating inferolateral scar  . Deafness in right ear   . Diabetes mellitus   . Diastolic dysfunction 02/262   Grade 1.  . Essential hypertension, benign   . Heart murmur    in past  . HOH (hard of hearing)   . Hyperkalemia   . Kidney stones   . Mixed hyperlipidemia   . Pancreatic insufficiency    Diagnosed at [facility]  . Prostate cancer ([place]) 2011  . Shortness of breath dyspnea    occasional - liver pressing on right lung, decreased capacity  . Subdural hematoma ([place])   . Type 2 diabetes mellitus ([place])   . Wears hearing aid    "crossover" form right to left   Past Surgical History:  Procedure Laterality Date  . APPENDECTOMY    . Bilateral hip replacement    . CATARACT EXTRACTION W/PHACO Left 05/15/2015   Procedure: CATARACT EXTRACTION PHACO AND INTRAOCULAR LENS PLACEMENT (IOC);  Surgeon: Ronnell Freshwater, MD;  Location: [place];  Service: Ophthalmology;  Laterality: Left;  DIABETIC - insulin pump and oral meds  . CATARACT EXTRACTION W/PHACO Right 08/28/2015   Procedure: CATARACT EXTRACTION PHACO AND INTRAOCULAR LENS PLACEMENT (IOC);  Surgeon: Ronnell Freshwater, MD;  Location: [place];  Service: Ophthalmology;  Laterality: Right;  DIABETIC PER PT AND CINDY PLEASE KEEP ARRIVAL TIME AFTER 8AM   . CHOLECYSTECTOMY    . HERNIA REPAIR    . PROSTATECTOMY  2011     Current Outpatient Prescriptions on File Prior to Visit  Medication Sig Dispense Refill  . aspirin EC 81 MG tablet Take 81 mg by mouth at bedtime.     . carvedilol (COREG) 25 MG tablet TAKE ONE TABLET BY MOUTH TWICE DAILY 180 tablet 3  . citalopram (CELEXA) 20 MG tablet TAKE ONE TABLET BY MOUTH ONCE DAILY 90 tablet 4  . diazepam (VALIUM) 5 MG tablet TAKE ONE TABLET BY MOUTH TWICE DAILY AS NEEDED 60 tablet 0  . doxazosin (CARDURA) 8 MG tablet Take 1 tablet (8 mg total) by mouth daily. 90 tablet 3  . doxycycline (VIBRA-TABS) 100 MG tablet Take 1 tablet (100 mg total) by mouth 2 (two) times daily. 20 tablet 0  . FREESTYLE LITE test strip USE ONE STRIP TO CHECK GLUCOSE THREE TIMES DAILY 300 each 1  . gabapentin (NEURONTIN) 300 MG capsule Take 2 capsules (600 mg total) by mouth at bedtime. 180 capsule 3  . insulin NPH Human (HUMULIN N,NOVOLIN N) 100 UNIT/ML injection 10u qam - 10 u qhs    . insulin regular (NOVOLIN R,HUMULIN R) 100 units/mL injection 10 u qam and 8-10 u at dinner    . Multiple Vitamin (MULTIVITAMIN WITH MINERALS) TABS Take 1 tablet by mouth daily.    . Pancrelipase, Lip-Prot-Amyl, 25000 units CPEP Take 1 tablet by mouth 3 (three) times daily. [place]  capsule 5  . pravastatin (PRAVACHOL) 10 MG tablet TAKE ONE TABLET BY MOUTH ONCE DAILY AT BEDTIME 90 tablet 3  . Vitamin D, Ergocalciferol, (DRISDOL) 50000 UNITS CAPS capsule Take 1 capsule (50,000 Units total) by mouth every 7 (seven) days. 4 capsule 6   No current facility-administered medications on file prior to visit.    Allergies  Allergen Reactions  . Enalapril Maleate Cough   Social History   Social History  . Marital status: Married    Spouse name: N/A  . Number of children: N/A  . Years of education: N/A   Occupational History  . Not on file.   Social History Main Topics  . Smoking status: Never Smoker  . Smokeless tobacco: Never Used  . Alcohol use No  . Drug use: No  . Sexual activity: Not on file   Other  Topics Concern  . Not on file   Social History Narrative  . No narrative on file      Review of Systems  All other systems reviewed and are negative.      Objective:   Physical Exam  Cardiovascular: Normal rate, regular rhythm and normal heart sounds.   Pulmonary/Chest: Effort normal and breath sounds normal.  Vitals reviewed.         Assessment & Plan:  History of prostate cancer - Plan: PSA  Recheck PSA. PSA continues to trend up, the patient will need a referral back to his urologist for treatment.

## 2016-08-29 ENCOUNTER — Encounter: Payer: Self-pay | Admitting: Family Medicine

## 2016-09-04 ENCOUNTER — Telehealth: Payer: Self-pay | Admitting: Sports Medicine

## 2016-09-04 NOTE — Telephone Encounter (Signed)
Pt called about his diabetic shoes. He said the paperwork was to be sent to his pcp for him to sign but I explained that we have to have the doctor that treats his  Diabetes to sign paperwork.   I talked with melody and then called pt back and pt states he is seeing Dr Dwyane Dee on the 7th and will discuss with him the need for the diabetic shoes to see if he will sign. Pt is to let us know the outcome

## 2016-09-11 ENCOUNTER — Ambulatory Visit (INDEPENDENT_AMBULATORY_CARE_PROVIDER_SITE_OTHER): Payer: PPO | Admitting: Endocrinology

## 2016-09-11 ENCOUNTER — Encounter: Payer: Self-pay | Admitting: Endocrinology

## 2016-09-11 VITALS — BP 130/72 | HR 69 | Wt 209.0 lb

## 2016-09-11 DIAGNOSIS — R945 Abnormal results of liver function studies: Secondary | ICD-10-CM

## 2016-09-11 DIAGNOSIS — I10 Essential (primary) hypertension: Secondary | ICD-10-CM | POA: Diagnosis not present

## 2016-09-11 DIAGNOSIS — Z794 Long term (current) use of insulin: Secondary | ICD-10-CM | POA: Diagnosis not present

## 2016-09-11 DIAGNOSIS — K7689 Other specified diseases of liver: Secondary | ICD-10-CM

## 2016-09-11 DIAGNOSIS — E1165 Type 2 diabetes mellitus with hyperglycemia: Secondary | ICD-10-CM

## 2016-09-11 LAB — COMPREHENSIVE METABOLIC PANEL
ALBUMIN: 3.8 g/dL (ref 3.5–5.2)
ALT: 43 U/L (ref 0–53)
AST: 39 U/L — AB (ref 0–37)
Alkaline Phosphatase: 135 U/L — ABNORMAL HIGH (ref 39–117)
BUN: 27 mg/dL — ABNORMAL HIGH (ref 6–23)
CALCIUM: 8.8 mg/dL (ref 8.4–10.5)
CHLORIDE: 109 meq/L (ref 96–112)
CO2: 25 meq/L (ref 19–32)
CREATININE: 1.34 mg/dL (ref 0.40–1.50)
GFR: 55.17 mL/min — ABNORMAL LOW (ref >60.00)
Glucose, Bld: 110 mg/dL — ABNORMAL HIGH (ref 70–99)
POTASSIUM: 4.5 meq/L (ref 3.5–5.1)
SODIUM: 140 meq/L (ref 135–145)
Total Bilirubin: 0.4 mg/dL (ref 0.2–1.2)
Total Protein: 6.9 g/dL (ref 6.0–8.3)

## 2016-09-11 LAB — MICROALBUMIN / CREATININE URINE RATIO
Creatinine,U: 63.7 mg/dL
MICROALB UR: 3.9 mg/dL — AB (ref 0.0–1.9)
Microalb Creat Ratio: 6.1 mg/g (ref 0.0–30.0)

## 2016-09-11 LAB — POCT GLYCOSYLATED HEMOGLOBIN (HGB A1C): HEMOGLOBIN A1C: 7.4

## 2016-09-11 NOTE — Progress Notes (Signed)
Patient ID: Mark Hose., male   DOB: 02-02-1941, 76 y.o.   MRN: [id number]           Reason for Appointment: Follow-up for Type 2 Diabetes  Referring physician: Pickard  History of Present Illness:          Date of diagnosis of type 2 diabetes mellitus :        Background history:  The first few years of his diabetes he was treated with Metformin At some point he was also given Actos by his PCP for control However because of worsening control he was started on insulin probably Lantus in 2005 and subsequently 70/30 insulin He was taking 45 units a day of premixed insulin in 2 divided doses  Because of poor control and variability he was started on the V-go pump with the 20 unit of basal on 05/10/15  However he stopped this in 6/17 because of the high cost  Recent history:   INSULIN regimen :  Novolin  R 8 u qam, 10 acl and  10 u at dinner Novolin N 10 a.m. --8 bedtime  His A1c is now slightly higher at 7.4, previously was significantly better at 7.1  Current blood sugar patterns and problems identified:  He does not really remember exactly how much insulin he is taking even though was told to change it the last time  He thinks he is taking 8 units at bedtime of the NPH, this was reduced on the last visit   Checking blood sugar mostly fasting and bedtime and not postprandially  FASTING blood sugars are generally fairly good with only one low blood sugar  Glucose readings  have not been checked midday lately and has one high reading and 1 normal reading.  He is losing little weight  His activity level is usually fairly steady, not doing anything strenuous  Most of his readings at BEDTIME are high with his monitoring being done at least 4 hours after eating  Oral hypoglycemic drugs the patient is taking are: Actos 30 mg daily      Side effects from medications have been: Some swelling from Actos treated with Lasix  Compliance with the medical regimen: Fair, he  thinks he can do better on diet Hypoglycemia: Symptoms are as follows: Sees lights flickering, off balance   Glucose monitoring:  done 2 times a day         Glucometer: Lamar Sprinkles    Blood Glucose readings by time of day and averages from download recently  Mean values apply above for all meters except median for One Touch  PRE-MEAL Fasting Lunch Dinner Bedtime Overall  Glucose range: 51-112    124-281    Mean/median: 92    190     Self-care: The diet that the patient has been following is: tries to limit sweets and drinks with sugar .     Meal times: Breakfast: 9 AM.  Lunch: 1-2 pm, Dinner: 5 pm. Eating out 3/7 days a week in the evenings   Typical meal intake: Breakfast cereal, now Eggs, sausage and egg toast. .  Lunch is a sandwich rarely, usually a snack.  Dinner is meat and vegetables, has peanut butter snacks periodically at lunch or bedtime   Dietician visit, most recent: several years ago               Exercise: active in workshop or yard, has back pain Limiting his walking   Weight history:   Wt Readings from  Last 3 Encounters:  09/11/16 209 lb (94.8 kg)  08/27/16 212 lb (96.2 kg)  06/14/16 213 lb (96.6 kg)    Glycemic control:   Lab Results  Component Value Date   HGBA1C 7.4 09/11/2016   HGBA1C 7.1 05/24/2016   HGBA1C 7.9 02/22/2016   Lab Results  Component Value Date   MICROALBUR 1.7 02/22/2016   LDLCALC 41 02/22/2016   CREATININE 1.29 (H) 05/27/2016       Allergies as of 09/11/2016      Reactions   Enalapril Maleate Cough      Medication List       Accurate as of 09/11/16  3:18 PM. Always use your most recent med list.          aspirin EC 81 MG tablet Take 81 mg by mouth at bedtime.   carvedilol 25 MG tablet Commonly known as:  COREG TAKE ONE TABLET BY MOUTH TWICE DAILY   citalopram 20 MG tablet Commonly known as:  CELEXA TAKE ONE TABLET BY MOUTH ONCE DAILY   diazepam 5 MG tablet Commonly known as:  VALIUM TAKE ONE TABLET BY MOUTH TWICE  DAILY AS NEEDED   doxazosin 8 MG tablet Commonly known as:  CARDURA Take 1 tablet (8 mg total) by mouth daily.   FREESTYLE LITE test strip Generic drug:  glucose blood USE ONE STRIP TO CHECK GLUCOSE THREE TIMES DAILY   furosemide 20 MG tablet Commonly known as:  LASIX Take 1 tablet (20 mg total) by mouth daily.   gabapentin 300 MG capsule Commonly known as:  NEURONTIN Take 2 capsules (600 mg total) by mouth at bedtime.   HYDROcodone-acetaminophen 5-325 MG tablet Commonly known as:  NORCO Take 1 tablet by mouth every 6 (six) hours as needed for moderate pain.   insulin NPH Human 100 UNIT/ML injection Commonly known as:  HUMULIN N,NOVOLIN N 10u qam - 10 u qhs   insulin regular 250 units/2.32m (100 units/mL) injection Commonly known as:  NOVOLIN R,HUMULIN R 10 u qam and 8-10 u at dinner   multivitamin with minerals Tabs tablet Take 1 tablet by mouth daily.   Pancrelipase (Lip-Prot-Amyl) 25000 units Cpep Take 1 tablet by mouth 3 (three) times daily.   pravastatin 10 MG tablet Commonly known as:  PRAVACHOL TAKE ONE TABLET BY MOUTH ONCE DAILY AT BEDTIME   valsartan 160 MG tablet Commonly known as:  DIOVAN Take 1 tablet (160 mg total) by mouth daily.       Allergies:  Allergies  Allergen Reactions  . Enalapril Maleate Cough    Past Medical History:  Diagnosis Date  . Arthritis    lower back  . Chronic back pain    Disc disease  . Chronic kidney disease   . Coronary atherosclerosis of native coronary artery    Coronary calcifications by chest CT, Myoview demonstrating inferolateral scar  . Deafness in right ear   . Diabetes mellitus   . Diastolic dysfunction 55/4492  Grade 1.  . Essential hypertension, benign   . Heart murmur    in past  . HOH (hard of hearing)   . Hyperkalemia   . Kidney stones   . Mixed hyperlipidemia   . Pancreatic insufficiency    [facility] [place]  . Shortness of breath dyspnea    occasional -  liver pressing on right lung, decreased capacity  . [place]oma ([place]   . Type 2 diabetes mellitus ([place]   . Wears hearing aid    "  crossover" form right to left    Past Surgical History:  Procedure Laterality Date  . APPENDECTOMY    . Bilateral hip replacement    . CATARACT EXTRACTION W/PHACO Left 05/15/2015   Procedure: CATARACT EXTRACTION PHACO AND INTRAOCULAR LENS PLACEMENT (IOC);  Surgeon: Ronnell Freshwater, MD;  Location: [place];  Service: Ophthalmology;  Laterality: Left;  DIABETIC - insulin pump and oral meds  . CATARACT EXTRACTION W/PHACO Right 08/28/2015   Procedure: CATARACT EXTRACTION PHACO AND INTRAOCULAR LENS PLACEMENT (IOC);  Surgeon: Ronnell Freshwater, MD;  Location: [place];  Service: Ophthalmology;  Laterality: Right;  DIABETIC PER PT AND CINDY PLEASE KEEP ARRIVAL TIME AFTER 8AM   . CHOLECYSTECTOMY    . HERNIA REPAIR    . PROSTATECTOMY  2011    Family History  Problem Relation Age of Onset  . Diabetes type II Mother   . Heart attack Father     Social History:  reports that he has never smoked. He has never used smokeless tobacco. He reports that he does not drink alcohol or use drugs.    Review of Systems    Lipid history: On 66m Pravastatin from PCP for Cardiovascular prophylaxis, LDL reviewed by PCP recently    Lab Results  Component Value Date   CHOL 97 02/22/2016   HDL 30.70 (L) 02/22/2016   LDLCALC 41 02/22/2016   LDLDIRECT 54 05/27/2016   TRIG 126.0 02/22/2016   CHOLHDL 3 02/22/2016            Most recent eye exam was in 9/16, apparently no retinopathy   Hypertension: , treated with carvedilol 25 mg, valsartan 160 mg and doxazosin, Followed by PCP  No significant renal dysfunction recently  Lab Results  Component Value Date   CREATININE 1.34 09/11/2016         He is taking gabapentin for his muscle cramps and not neuropathy  Endocrine: Hypogonadism has been present for at least a few years  but his urologist has not wanted to treat him because of history of prostate cancer treated surgically several years ago  Not complaining of excessive fatigue    Physical Examination:  BP 130/72   Pulse 69   Wt 209 lb (94.8 kg)   SpO2 92%   BMI 30.86 kg/m    ASSESSMENT:  Diabetes type 2, uncontrolled    See history of present illness for detailed discussion of his current management, blood sugar patterns and problems identified  Recent A1c 7.4  Currently with taking NPH and regular insulin twice a day  Even with relatively small doses her blood sugars are reasonably well controlled Fasting blood sugars are low normal now and more consistent However his blood sugars are tending to be mostly high at bedtime which is 4-5 hours after eating Also previously had significantly high before lunch but not clear if these are still high as he is not monitoring these He is also not clear what insulin dosing he is taking No significant hypoglycemia except once on waking up  HYPERTENSION: Excellent control  Needs annual eye exams  PLAN:   More variety of blood sugar monitoring including 2 hours after breakfast or supper  Reduce bedtime NPH by 2 units again, presumably he is taking 8 units now  Also increase suppertime regular insulin by 2 units, most likely he is currently taking 10 units  Check more readings midday and afternoon and consider increasing NPH if midday readings are consistently high  When he starts getting more  active in Spring he'll need to take a snack before getting more active  Balanced meals and bedtime snack  Follow-up in 3 months  Recheck renal and liver function today, also needs follow-up urine microalbumin   Counseling time on subjects discussed above is over 50% of today's 25 minute visit  Patient Instructions  Clear 12 at supper instead of 10  BEDTIME cloudy 6 units, not 8  More sugars at lunch   [facility] 09/11/2016, 3:18 PM   Note: This  office note was prepared with Dragon voice recognition system technology. Any transcriptional errors that result from this process are unintentional.

## 2016-09-11 NOTE — Patient Instructions (Addendum)
Clear 12 at supper instead of 10  BEDTIME cloudy 6 units, not 8  More sugars at lunch

## 2016-09-26 ENCOUNTER — Encounter: Payer: Self-pay | Admitting: Family Medicine

## 2016-09-27 MED ORDER — HYDROCODONE-ACETAMINOPHEN 5-325 MG PO TABS: 1.0000 | ORAL_TABLET | Freq: Four times a day (QID) | ORAL | 0 refills | Status: DC | PRN

## 2016-10-01 ENCOUNTER — Ambulatory Visit: Payer: PPO | Admitting: Sports Medicine

## 2016-10-07 ENCOUNTER — Ambulatory Visit (INDEPENDENT_AMBULATORY_CARE_PROVIDER_SITE_OTHER): Payer: PPO | Admitting: Podiatry

## 2016-10-07 ENCOUNTER — Encounter: Payer: Self-pay | Admitting: Podiatry

## 2016-10-07 DIAGNOSIS — M79609 Pain in unspecified limb: Secondary | ICD-10-CM | POA: Diagnosis not present

## 2016-10-07 DIAGNOSIS — L603 Nail dystrophy: Secondary | ICD-10-CM | POA: Diagnosis not present

## 2016-10-07 DIAGNOSIS — E0843 Diabetes mellitus due to underlying condition with diabetic autonomic (poly)neuropathy: Secondary | ICD-10-CM | POA: Diagnosis not present

## 2016-10-07 DIAGNOSIS — L608 Other nail disorders: Secondary | ICD-10-CM

## 2016-10-07 DIAGNOSIS — B351 Tinea unguium: Secondary | ICD-10-CM

## 2016-10-07 NOTE — Progress Notes (Signed)
   SUBJECTIVE Patient with a history of diabetes mellitus presents to office today complaining of elongated, thickened nails. Pain while ambulating in shoes. Patient is unable to trim their own nails.   OBJECTIVE General Patient is awake, alert, and oriented x 3 and in no acute distress. Derm Skin is dry and supple bilateral. Negative open lesions or macerations. Remaining integument unremarkable. Nails are tender, long, thickened and dystrophic with subungual debris, consistent with onychomycosis, 1-5 bilateral. No signs of infection noted. Vasc  DP and PT pedal pulses palpable bilaterally. Temperature gradient within normal limits.  Neuro Epicritic and protective threshold sensation diminished bilaterally.  Musculoskeletal Exam No symptomatic pedal deformities noted bilateral. Muscular strength within normal limits.  ASSESSMENT 1. Diabetes Mellitus w/ peripheral neuropathy 2. Onychomycosis of nail due to dermatophyte bilateral 3. Pain in foot bilateral  PLAN OF CARE 1. Patient evaluated today. 2. Instructed to maintain good pedal hygiene and foot care. Stressed importance of controlling blood sugar.  3. Mechanical debridement of nails 1-5 bilaterally performed using a nail nipper. Filed with dremel without incident.  4. Return to clinic in 3 mos.     Edrick Kins, DPM [facility]  Dr. Edrick Kins, [address]                                        [place], [place] [address]                Office [phone]  Fax [phone]

## 2016-10-21 ENCOUNTER — Other Ambulatory Visit: Payer: Self-pay | Admitting: Family Medicine

## 2016-10-22 ENCOUNTER — Other Ambulatory Visit: Payer: Self-pay | Admitting: *Deleted

## 2016-10-22 ENCOUNTER — Encounter: Payer: Self-pay | Admitting: Family Medicine

## 2016-10-22 MED ORDER — HYDROCODONE-ACETAMINOPHEN 5-325 MG PO TABS: 1.0000 | ORAL_TABLET | Freq: Four times a day (QID) | ORAL | 0 refills | Status: DC | PRN

## 2016-10-22 NOTE — Telephone Encounter (Signed)
RX printed, left up front and patient aware to pick up  

## 2016-11-11 ENCOUNTER — Other Ambulatory Visit: Payer: PPO

## 2016-11-11 DIAGNOSIS — R300 Dysuria: Secondary | ICD-10-CM | POA: Diagnosis not present

## 2016-11-12 ENCOUNTER — Encounter: Payer: Self-pay | Admitting: Family Medicine

## 2016-11-13 LAB — URINALYSIS, MICROSCOPIC ONLY
Bacteria, UA: NONE SEEN [HPF]
CRYSTALS: NONE SEEN [HPF]
Casts: NONE SEEN [LPF]
Squamous Epithelial / LPF: NONE SEEN [HPF] (ref <=5)
Yeast: NONE SEEN [HPF]

## 2016-11-13 LAB — URINALYSIS, ROUTINE W REFLEX MICROSCOPIC
BILIRUBIN URINE: NEGATIVE
Glucose, UA: NEGATIVE
Hgb urine dipstick: NEGATIVE
KETONES UR: NEGATIVE
Leukocytes, UA: NEGATIVE
NITRITE: NEGATIVE
SPECIFIC GRAVITY, URINE: 1.025 (ref 1.001–1.035)
pH: 5 (ref 5.0–8.0)

## 2016-11-14 ENCOUNTER — Encounter: Payer: Self-pay | Admitting: Family Medicine

## 2016-11-14 ENCOUNTER — Ambulatory Visit (INDEPENDENT_AMBULATORY_CARE_PROVIDER_SITE_OTHER): Payer: PPO | Admitting: Family Medicine

## 2016-11-14 ENCOUNTER — Ambulatory Visit (HOSPITAL_COMMUNITY)
Admission: RE | Admit: 2016-11-14 | Discharge: 2016-11-14 | Disposition: A | Discharge status: Home / Self Care | Payer: PPO | Source: Ambulatory Visit | Attending: Family Medicine | Admitting: Family Medicine

## 2016-11-14 VITALS — BP 118/62 | HR 76 | Temp 97.8°F | Resp 19 | Ht 69.0 in | Wt 217.0 lb

## 2016-11-14 DIAGNOSIS — M5136 Other intervertebral disc degeneration, lumbar region: Secondary | ICD-10-CM | POA: Diagnosis not present

## 2016-11-14 DIAGNOSIS — M545 Low back pain, unspecified: Secondary | ICD-10-CM

## 2016-11-14 NOTE — Progress Notes (Signed)
Subjective:    Patient ID: [name]., male    DOB: June 13, 1941, 76 y.o.   MRN: [id number]  HPI Approximately 1 week ago, the patient developed lower left-sided low back pain. Pain is located above his iliac crest. It is definitely in his lower left flank. There is no radiation of the pain down his leg. He denies any numbness or tingling. He denies any hematuria or dysuria. Recently had a urinalysis that revealed no sign of an infection. He has no CVA tenderness. He does have a history of degenerative disc disease and chronic low back pain. On examination today he does have a palpable muscle spasm in the area of pain. Past Medical History:  Diagnosis Date  . Arthritis    lower back  . Chronic back pain    Disc disease  . Chronic kidney disease   . Coronary atherosclerosis of native coronary artery    Coronary calcifications by chest CT, Myoview demonstrating inferolateral scar  . Deafness in right ear   . Diabetes mellitus   . Diastolic dysfunction 0/9470   Grade 1.  . Essential hypertension, benign   . Heart murmur    in past  . HOH (hard of hearing)   . Hyperkalemia   . Kidney stones   . Mixed hyperlipidemia   . Pancreatic insufficiency    Diagnosed at [facility]  . Prostate cancer ([place]) 2011  . Shortness of breath dyspnea    occasional - liver pressing on right lung, decreased capacity  . Subdural hematoma ([place])   . Type 2 diabetes mellitus ([place])   . Wears hearing aid    "crossover" form right to left   Past Surgical History:  Procedure Laterality Date  . APPENDECTOMY    . Bilateral hip replacement    . CATARACT EXTRACTION W/PHACO Left 05/15/2015   Procedure: CATARACT EXTRACTION PHACO AND INTRAOCULAR LENS PLACEMENT (IOC);  Surgeon: Ronnell Freshwater, MD;  Location: [place];  Service: Ophthalmology;  Laterality: Left;  DIABETIC - insulin pump and oral meds  . CATARACT EXTRACTION W/PHACO Right 08/28/2015   Procedure: CATARACT EXTRACTION PHACO AND  INTRAOCULAR LENS PLACEMENT (IOC);  Surgeon: Ronnell Freshwater, MD;  Location: [place];  Service: Ophthalmology;  Laterality: Right;  DIABETIC PER PT AND CINDY PLEASE KEEP ARRIVAL TIME AFTER 8AM   . CHOLECYSTECTOMY    . HERNIA REPAIR    . PROSTATECTOMY  2011   Current Outpatient Prescriptions on File Prior to Visit  Medication Sig Dispense Refill  . aspirin EC 81 MG tablet Take 81 mg by mouth at bedtime.     . carvedilol (COREG) 25 MG tablet TAKE ONE TABLET BY MOUTH TWICE DAILY 180 tablet 3  . citalopram (CELEXA) 20 MG tablet TAKE ONE TABLET BY MOUTH ONCE DAILY 90 tablet 4  . diazepam (VALIUM) 5 MG tablet TAKE ONE TABLET BY MOUTH TWICE DAILY AS NEEDED 60 tablet 0  . doxazosin (CARDURA) 8 MG tablet Take 1 tablet (8 mg total) by mouth daily. 90 tablet 3  . FREESTYLE LITE test strip USE ONE STRIP TO CHECK GLUCOSE THREE TIMES DAILY 300 each 1  . furosemide (LASIX) 20 MG tablet Take 1 tablet (20 mg total) by mouth daily. 90 tablet 1  . gabapentin (NEURONTIN) 300 MG capsule Take 2 capsules (600 mg total) by mouth at bedtime. 180 capsule 3  . HYDROcodone-acetaminophen (NORCO) 5-325 MG tablet Take 1 tablet by mouth every 6 (six) hours as needed for moderate pain. 60 tablet  0  . insulin NPH Human (HUMULIN N,NOVOLIN N) 100 UNIT/ML injection 10u qam - 10 u qhs    . insulin regular (NOVOLIN R,HUMULIN R) 100 units/mL injection 10 u qam and 8-10 u at dinner    . Multiple Vitamin (MULTIVITAMIN WITH MINERALS) TABS Take 1 tablet by mouth daily.    . Pancrelipase, Lip-Prot-Amyl, 25000 units CPEP Take 1 tablet by mouth 3 (three) times daily. 90 capsule 5  . pravastatin (PRAVACHOL) 10 MG tablet TAKE ONE TABLET BY MOUTH ONCE DAILY AT BEDTIME 90 tablet 3  . valsartan (DIOVAN) 160 MG tablet Take 1 tablet (160 mg total) by mouth daily. 90 tablet 3   No current facility-administered medications on file prior to visit.    Allergies  Allergen Reactions  . Enalapril Maleate Cough   Social History    Social History  . Marital status: Married    Spouse name: N/A  . Number of children: N/A  . Years of education: N/A   Occupational History  . Not on file.   Social History Main Topics  . Smoking status: Never Smoker  . Smokeless tobacco: Never Used  . Alcohol use No  . Drug use: No  . Sexual activity: Not on file   Other Topics Concern  . Not on file   Social History Narrative  . No narrative on file      Review of Systems  All other systems reviewed and are negative.      Objective:   Physical Exam  Cardiovascular: Normal rate, regular rhythm and normal heart sounds.   Pulmonary/Chest: Effort normal and breath sounds normal. No respiratory distress. He has no wheezes. He has no rales.  Abdominal: Soft. Bowel sounds are normal.  Musculoskeletal:       Lumbar back: He exhibits decreased range of motion, tenderness, pain and spasm. He exhibits no bony tenderness.       Back:  Vitals reviewed.         Assessment & Plan:  Acute left-sided low back pain without sciatica - Plan: DG Lumbar Spine Complete  I believe his low back pain secondary to muscle spasms which are likely compensatory to his degenerative disc disease in his lumbar spine. I see no evidence of urinary tract infection. Obtain an x-ray of the lumbar spine. Patient is concerned by his history of prostate cancer about possible metastatic spread. I believe a basic x-ray can help give the patient some peace of mind.  Meanwhile manage the pain symptomatically with hydrocodone as needed

## 2016-11-15 ENCOUNTER — Encounter: Payer: Self-pay | Admitting: Family Medicine

## 2016-11-20 ENCOUNTER — Encounter: Payer: Self-pay | Admitting: Family Medicine

## 2016-11-21 MED ORDER — HYDROCODONE-ACETAMINOPHEN 5-325 MG PO TABS: 1.0000 | ORAL_TABLET | Freq: Four times a day (QID) | ORAL | 0 refills | Status: DC | PRN

## 2016-11-26 ENCOUNTER — Other Ambulatory Visit: Payer: Self-pay | Admitting: Family Medicine

## 2016-11-26 MED ORDER — DIAZEPAM 5 MG PO TABS: 5.0000 mg | ORAL_TABLET | Freq: Two times a day (BID) | ORAL | 1 refills | Status: DC | PRN

## 2016-12-09 ENCOUNTER — Ambulatory Visit: Payer: PPO | Admitting: Endocrinology

## 2016-12-17 ENCOUNTER — Other Ambulatory Visit: Payer: Self-pay | Admitting: Family Medicine

## 2016-12-19 MED ORDER — HYDROCODONE-ACETAMINOPHEN 5-325 MG PO TABS: 1.0000 | ORAL_TABLET | Freq: Four times a day (QID) | ORAL | 0 refills | Status: DC | PRN

## 2016-12-19 NOTE — Telephone Encounter (Signed)
RX printed, left up front and patient aware to pick up via mychart

## 2016-12-26 ENCOUNTER — Other Ambulatory Visit: Payer: Self-pay | Admitting: Family Medicine

## 2016-12-26 MED ORDER — CITALOPRAM HYDROBROMIDE 20 MG PO TABS: 20.0000 mg | ORAL_TABLET | Freq: Every day | ORAL | 4 refills | Status: DC

## 2017-01-01 ENCOUNTER — Other Ambulatory Visit: Payer: Self-pay | Admitting: Family Medicine

## 2017-01-01 MED ORDER — DOXAZOSIN MESYLATE 8 MG PO TABS: 8.0000 mg | ORAL_TABLET | Freq: Every day | ORAL | 3 refills | Status: DC

## 2017-01-06 ENCOUNTER — Encounter: Payer: Self-pay | Admitting: Podiatry

## 2017-01-06 ENCOUNTER — Ambulatory Visit (INDEPENDENT_AMBULATORY_CARE_PROVIDER_SITE_OTHER): Payer: PPO | Admitting: Podiatry

## 2017-01-06 DIAGNOSIS — M79676 Pain in unspecified toe(s): Secondary | ICD-10-CM

## 2017-01-06 DIAGNOSIS — B351 Tinea unguium: Secondary | ICD-10-CM

## 2017-01-06 DIAGNOSIS — E0842 Diabetes mellitus due to underlying condition with diabetic polyneuropathy: Secondary | ICD-10-CM

## 2017-01-06 NOTE — Progress Notes (Signed)
   SUBJECTIVE Patient with a history of diabetes mellitus presents to office today complaining of elongated, thickened nails. Pain while ambulating in shoes. Patient is unable to trim their own nails.   OBJECTIVE General Patient is awake, alert, and oriented x 3 and in no acute distress. Derm Skin is dry and supple bilateral. Negative open lesions or macerations. Remaining integument unremarkable. Nails are tender, long, thickened and dystrophic with subungual debris, consistent with onychomycosis, 1-5 bilateral. No signs of infection noted. Vasc  DP and PT pedal pulses palpable bilaterally. Temperature gradient within normal limits.  Neuro Epicritic and protective threshold sensation diminished bilaterally.  Musculoskeletal Exam No symptomatic pedal deformities noted bilateral. Muscular strength within normal limits.  ASSESSMENT 1. Diabetes Mellitus w/ peripheral neuropathy 2. Onychomycosis of nail due to dermatophyte bilateral 3. Pain in foot bilateral  PLAN OF CARE 1. Patient evaluated today. 2. Instructed to maintain good pedal hygiene and foot care. Stressed importance of controlling blood sugar.  3. Mechanical debridement of nails 1-5 bilaterally performed using a nail nipper. Filed with dremel without incident.  4. Return to clinic in 3 mos.     Edrick Kins, DPM [facility]  Dr. Edrick Kins, [address]                                        [place], [place] [address]                Office [phone]  Fax [phone]

## 2017-01-13 ENCOUNTER — Encounter: Payer: Self-pay | Admitting: Family Medicine

## 2017-01-14 MED ORDER — HYDROCODONE-ACETAMINOPHEN 7.5-325 MG PO TABS: 1.0000 | ORAL_TABLET | Freq: Four times a day (QID) | ORAL | 0 refills | Status: DC | PRN

## 2017-01-29 ENCOUNTER — Ambulatory Visit (INDEPENDENT_AMBULATORY_CARE_PROVIDER_SITE_OTHER): Payer: PPO | Admitting: Endocrinology

## 2017-01-29 ENCOUNTER — Encounter: Payer: Self-pay | Admitting: Endocrinology

## 2017-01-29 VITALS — BP 112/84 | HR 64 | Ht 69.0 in | Wt 216.6 lb

## 2017-01-29 DIAGNOSIS — I10 Essential (primary) hypertension: Secondary | ICD-10-CM

## 2017-01-29 DIAGNOSIS — Z794 Long term (current) use of insulin: Secondary | ICD-10-CM | POA: Diagnosis not present

## 2017-01-29 DIAGNOSIS — R5383 Other fatigue: Secondary | ICD-10-CM

## 2017-01-29 DIAGNOSIS — E1165 Type 2 diabetes mellitus with hyperglycemia: Secondary | ICD-10-CM | POA: Diagnosis not present

## 2017-01-29 DIAGNOSIS — E782 Mixed hyperlipidemia: Secondary | ICD-10-CM

## 2017-01-29 LAB — COMPREHENSIVE METABOLIC PANEL
ALBUMIN: 3.9 g/dL (ref 3.5–5.2)
ALK PHOS: 147 U/L — AB (ref 39–117)
ALT: 52 U/L (ref 0–53)
AST: 34 U/L (ref 0–37)
BILIRUBIN TOTAL: 0.3 mg/dL (ref 0.2–1.2)
BUN: 21 mg/dL (ref 6–23)
CO2: 29 mEq/L (ref 19–32)
Calcium: 8.9 mg/dL (ref 8.4–10.5)
Chloride: 107 mEq/L (ref 96–112)
Creatinine, Ser: 1.16 mg/dL (ref 0.40–1.50)
GFR: 65.09 mL/min (ref >60.00)
GLUCOSE: 88 mg/dL (ref 70–99)
Potassium: 4.4 mEq/L (ref 3.5–5.1)
SODIUM: 139 meq/L (ref 135–145)
TOTAL PROTEIN: 6.6 g/dL (ref 6.0–8.3)

## 2017-01-29 LAB — POCT GLYCOSYLATED HEMOGLOBIN (HGB A1C): HEMOGLOBIN A1C: 8

## 2017-01-29 LAB — TSH: TSH: 2.6 u[IU]/mL (ref 0.35–4.50)

## 2017-01-29 NOTE — Patient Instructions (Addendum)
Check blood sugars on waking up  4/7 days  Also check blood sugars about 2 hours after a meal and do this after different meals by rotation  Recommended blood sugar levels on waking up is 90-130 and about 2 hours after meal is 130-160  Please bring your blood sugar monitor to each visit, thank you  DO NOT CHANGE BEDTIME N INSULIN unless am sugar out of range.  Take 6 units N at bedtime  Have a protein at Bfst and less cereal  Take insulin 10-15 min before meals

## 2017-01-29 NOTE — Progress Notes (Signed)
Patient ID: [name]., male   DOB: [birth year], 76 y.o.   MRN: [id number]           Reason for Appointment: Follow-up for Type 2 Diabetes  Referring physician: Pickard  History of Present Illness:          Date of diagnosis of type 2 diabetes mellitus :        Background history:  The first few years of his diabetes he was treated with Metformin At some point he was also given Actos by his PCP for control However because of worsening control he was started on insulin probably Lantus in 2005 and subsequently 70/30 insulin He was taking 45 units a day of premixed insulin in 2 divided doses  Because of poor control and variability he was started on the V-go pump with the 20 unit of basal on 05/10/15  However he stopped this in 6/17 because of the high cost  Recent history:   INSULIN regimen :  Novolin  R 10 u qam, 10 acl and  10 u at dinner Novolin N 12 a.m. --8 bedtime  His A1c is now higher at 8%, previously 7.4 and as low as 7.1  Current blood sugar patterns and problems identified:  He does not follow instructions for his insulin doses and still continues to use somewhat arbitrary doses, also not clear if he remembers his dose accurately  Even though his A1c is higher his home blood sugars do not appear any different than usual  FASTING blood sugars are below 70 about 4 times in the last 2 weeks  He now says that he is adjusting at bedtime NPH based on what his blood sugar is and if it is near normal even skip the dose, otherwise may take as much as 10 units even though he was supposed to take 8 units   Checking blood sugar mostly fasting and bedtime and not during the day much  FASTING blood sugars are averaging below 100 and relatively high only once  Bedtime readings are somewhat variable although only high about 5 or 6 times in the last 2 weeks  He thinks she gets minimal symptoms with hypoglycemia, does not always feel his sugar getting low when it is reading  50 2 in the morning  Otherwise HYPOGLYCEMIA has been minimally documented in the afternoon with only one reading of 64  His activity level is usually fairly steady, not doing anything strenuous but usually doing this around midday  He now says that he is not preparing a mixed meal or a balanced meal in the morning and usually eating rice crispies without any protein  Also his MEALTIME insulin is being taken generally right before eating and if he is eating out he will take it in the car before going into 8  Oral hypoglycemic drugs the patient is taking are: Actos 30 mg daily      Side effects from medications have been: Some swelling from Actos treated with Lasix  Compliance with the medical regimen: Fair, he thinks he can do better on diet Hypoglycemia: Symptoms are as follows: Sees lights flickering, off balance   Glucose monitoring:  done 2 times a day         Glucometer: Lamar Sprinkles    Blood Glucose readings by time of day and averages from download recently  Mean values apply above for all meters except median for One Touch  PRE-MEAL Fasting Lunch Dinner Bedtime Overall  Glucose range: 54-164 85-362  64  78-223   Mean/median: 95    165 142     Self-care: The diet that the patient has been following is: tries to limit sweets and drinks with sugar .     Meal times: Breakfast: 9 AM.  Lunch: 1-2 pm, Dinner: 5 pm. Eating out 3/7 days a week in the evenings   Typical meal intake: Breakfast is cereal.   Lunch is a sandwich rarely, usually a snack.  Dinner is meat and vegetables, has peanut butter snacks periodically at lunch or bedtime   Dietician visit, most recent: several years ago               Exercise: active in workshop or yard, has back pain limiting his walking ability  Weight history:   Wt Readings from Last 3 Encounters:  01/29/17 216 lb 9.6 oz (98.2 kg)  11/14/16 217 lb (98.4 kg)  09/11/16 209 lb (94.8 kg)    Glycemic control:   Lab Results  Component Value Date     HGBA1C 8.0 01/29/2017   HGBA1C 7.4 09/11/2016   HGBA1C 7.1 05/24/2016   Lab Results  Component Value Date   MICROALBUR 3.9 (H) 09/11/2016   LDLCALC 41 02/22/2016   CREATININE 1.16 01/29/2017       Allergies as of 01/29/2017      Reactions   Enalapril Maleate Cough      Medication List       Accurate as of 01/29/17  8:48 PM. Always use your most recent med list.          aspirin EC 81 MG tablet Take 81 mg by mouth at bedtime.   carvedilol 25 MG tablet Commonly known as:  COREG TAKE ONE TABLET BY MOUTH TWICE DAILY   citalopram 20 MG tablet Commonly known as:  CELEXA Take 1 tablet (20 mg total) by mouth daily.   diazepam 5 MG tablet Commonly known as:  VALIUM Take 1 tablet (5 mg total) by mouth 2 (two) times daily as needed.   doxazosin 8 MG tablet Commonly known as:  CARDURA Take 1 tablet (8 mg total) by mouth daily.   FREESTYLE LITE test strip Generic drug:  glucose blood USE ONE STRIP TO CHECK GLUCOSE THREE TIMES DAILY   furosemide 20 MG tablet Commonly known as:  LASIX Take 1 tablet (20 mg total) by mouth daily.   gabapentin 300 MG capsule Commonly known as:  NEURONTIN Take 2 capsules (600 mg total) by mouth at bedtime.   HYDROcodone-acetaminophen 7.5-325 MG tablet Commonly known as:  NORCO Take 1 tablet by mouth every 6 (six) hours as needed for moderate pain.   insulin NPH Human 100 UNIT/ML injection Commonly known as:  HUMULIN N,NOVOLIN N 12u qam - 8 u qhs   insulin regular 100 units/mL injection Commonly known as:  NOVOLIN R,HUMULIN R 10 u qam, 5-6 units at lunch and 8-10 u at dinner   multivitamin with minerals Tabs tablet Take 1 tablet by mouth daily.   Pancrelipase (Lip-Prot-Amyl) 25000 units Cpep Take 1 tablet by mouth 3 (three) times daily.   pravastatin 10 MG tablet Commonly known as:  PRAVACHOL TAKE ONE TABLET BY MOUTH ONCE DAILY AT BEDTIME   valsartan 160 MG tablet Commonly known as:  DIOVAN Take 1 tablet (160 mg total)  by mouth daily.       Allergies:  Allergies  Allergen Reactions  . Enalapril Maleate Cough    Past Medical History:  Diagnosis Date  . Arthritis  lower back  . Chronic back pain    Disc disease  . Chronic kidney disease   . Coronary atherosclerosis of native coronary artery    Coronary calcifications by chest CT, Myoview demonstrating inferolateral scar  . Deafness in right ear   . Diabetes mellitus   . Diastolic dysfunction 03/2422   Grade 1.  . Essential hypertension, benign   . Heart murmur    in past  . HOH (hard of hearing)   . Hyperkalemia   . Kidney stones   . Mixed hyperlipidemia   . Pancreatic insufficiency    Diagnosed at [facility]  . Prostate cancer ([place]) 2011  . Shortness of breath dyspnea    occasional - liver pressing on right lung, decreased capacity  . Subdural hematoma ([place])   . Type 2 diabetes mellitus ([place])   . Wears hearing aid    "crossover" form right to left    Past Surgical History:  Procedure Laterality Date  . APPENDECTOMY    . Bilateral hip replacement    . CATARACT EXTRACTION W/PHACO Left 05/15/2015   Procedure: CATARACT EXTRACTION PHACO AND INTRAOCULAR LENS PLACEMENT (IOC);  Surgeon: Ronnell Freshwater, MD;  Location: [place];  Service: Ophthalmology;  Laterality: Left;  DIABETIC - insulin pump and oral meds  . CATARACT EXTRACTION W/PHACO Right 08/28/2015   Procedure: CATARACT EXTRACTION PHACO AND INTRAOCULAR LENS PLACEMENT (IOC);  Surgeon: Ronnell Freshwater, MD;  Location: [place];  Service: Ophthalmology;  Laterality: Right;  DIABETIC PER PT AND CINDY PLEASE KEEP ARRIVAL TIME AFTER 8AM   . CHOLECYSTECTOMY    . HERNIA REPAIR    . PROSTATECTOMY  2011    Family History  Problem Relation Age of Onset  . Diabetes type II Mother   . Heart attack Father     Social History:  reports that he has never smoked. He has never used smokeless tobacco. He reports that he does not drink alcohol or use  drugs.    Review of Systems    Lipid history: On 49m Pravastatin from PCP for Cardiovascular prophylaxis Needs follow-up   Lab Results  Component Value Date   CHOL 97 02/22/2016   HDL 30.70 (L) 02/22/2016   LDLCALC 41 02/22/2016   LDLDIRECT 54 05/27/2016   TRIG 126.0 02/22/2016   CHOLHDL 3 02/22/2016              Hypertension: , treated with carvedilol 25 mg, valsartan 160 mg and doxazosin, Followed by PCP  No  renal dysfunction recently  Lab Results  Component Value Date   CREATININE 1.16 01/29/2017         He is taking gabapentin for his muscle cramps and not neuropathy  Endocrine: Hypogonadism has been present for at least a few years but his urologist has not wanted to treat him because of history of prostate cancer treated surgically several years ago  He is now complaining of excessive fatigue, has not had any thyroid levels checked in some time   Needs annual eye exams  Physical Examination:  BP 112/84   Pulse 64   Ht 5' 9"  (1.753 m)   Wt 216 lb 9.6 oz (98.2 kg)   SpO2 96%   BMI 31.99 kg/m    ASSESSMENT:  Diabetes type 2, uncontrolled    See history of present illness for detailed discussion of his current management, blood sugar patterns and problems identified  Recent A1c 8% and higher than before, has been as low as 7.1  Currently taking NPH and regular insulin twice a day, cannot afford brand name insulins  As discussed above not clear how to explain his higher A1c has his home blood sugars averaging 142 only Also he appears to be getting more low blood sugars fasting than before probably because he is taking more insulin at bedtime than he was told to However he says that even sometimes when he skips his bedtime doses morning readings will not be high He tends to adjust his bedtime dose based on the reading at the time of injection Also not adjusting his mealtime doses based on what he is eating and readings are 5 hours after eating May be  getting higher readings after breakfast and lunch especially with eating only cereal in the morning  HYPERTENSION: Generally under control  Hypercholesterolemia: Needs follow-up levels today  PLAN:   Discussed onset and duration of action of both basal and bolus insulin that he is taking  Needs to take his regular insulin at least 15-30 minutes before eating preferably  He will need to add protein in the morning and discussed various options, this will help keep his sugars from spiking the morning and preventing potentially low sugars at lunchtime  He will need to stop adjusting his bedtime NPH based on the reading at 9 PM  He will take only 6 units of NPH consistently every night and only change in the morning sugars are out of target  Discuss glucose targets; also since he may be getting erratic readings occasionally he was given a new FreeStyle meter today  Discussed his A1c results  More glucose monitoring after breakfast and lunch and does not need to do a reading overnight  He will call if blood sugars are not consistently controlled  Fatigue: Will check thyroid level  Counseling time on subjects discussed above is over 50% of today's 25 minute visit  Patient Instructions  Check blood sugars on waking up  4/7 days  Also check blood sugars about 2 hours after a meal and do this after different meals by rotation  Recommended blood sugar levels on waking up is 90-130 and about 2 hours after meal is 130-160  Please bring your blood sugar monitor to each visit, thank you  DO NOT CHANGE BEDTIME N INSULIN unless am sugar out of range.  Take 6 units N at bedtime  Have a protein at Bfst and less cereal  Take insulin 10-15 min before meals    Kelvin Sennett 01/29/2017, 8:48 PM   Note: This office note was prepared with Estate agent. Any transcriptional errors that result from this process are unintentional.

## 2017-02-01 ENCOUNTER — Other Ambulatory Visit: Payer: Self-pay | Admitting: Family Medicine

## 2017-02-02 ENCOUNTER — Encounter: Payer: Self-pay | Admitting: Family Medicine

## 2017-02-02 DIAGNOSIS — K861 Other chronic pancreatitis: Secondary | ICD-10-CM

## 2017-02-03 ENCOUNTER — Other Ambulatory Visit: Payer: Self-pay | Admitting: Family Medicine

## 2017-02-03 MED ORDER — PANCRELIPASE (LIP-PROT-AMYL) 10000 UNITS PO CPEP: 1.0000 | ORAL_CAPSULE | Freq: Three times a day (TID) | ORAL | 3 refills | Status: DC

## 2017-02-03 MED ORDER — GABAPENTIN 300 MG PO CAPS: 600.0000 mg | ORAL_CAPSULE | Freq: Every day | ORAL | 3 refills | Status: DC

## 2017-02-06 ENCOUNTER — Other Ambulatory Visit: Payer: Self-pay | Admitting: Family Medicine

## 2017-02-06 MED ORDER — HYDROCODONE-ACETAMINOPHEN 7.5-325 MG PO TABS
1.0000 | ORAL_TABLET | Freq: Four times a day (QID) | ORAL | 0 refills | Status: DC | PRN
Start: 2017-02-06 — End: 2017-03-05

## 2017-02-06 MED ORDER — PRAVASTATIN SODIUM 10 MG PO TABS: 10.0000 mg | ORAL_TABLET | Freq: Every day | ORAL | 3 refills | Status: DC

## 2017-02-06 MED ORDER — CARVEDILOL 25 MG PO TABS: 25.0000 mg | ORAL_TABLET | Freq: Two times a day (BID) | ORAL | 3 refills | Status: DC

## 2017-02-07 ENCOUNTER — Other Ambulatory Visit: Payer: PPO

## 2017-02-07 DIAGNOSIS — R972 Elevated prostate specific antigen [PSA]: Secondary | ICD-10-CM

## 2017-02-08 LAB — PSA: PSA: <0.1 ng/mL (ref <=4.0)

## 2017-02-10 ENCOUNTER — Encounter: Payer: Self-pay | Admitting: Family Medicine

## 2017-02-13 ENCOUNTER — Other Ambulatory Visit: Payer: Self-pay | Admitting: Endocrinology

## 2017-02-17 ENCOUNTER — Encounter: Payer: Self-pay | Admitting: Family Medicine

## 2017-02-17 MED ORDER — LOSARTAN POTASSIUM 50 MG PO TABS: 50.0000 mg | ORAL_TABLET | Freq: Every day | ORAL | 3 refills | Status: DC

## 2017-03-05 ENCOUNTER — Other Ambulatory Visit: Payer: Self-pay | Admitting: Family Medicine

## 2017-03-06 MED ORDER — HYDROCODONE-ACETAMINOPHEN 7.5-325 MG PO TABS: 1.0000 | ORAL_TABLET | Freq: Four times a day (QID) | ORAL | 0 refills | Status: DC | PRN

## 2017-03-06 NOTE — Telephone Encounter (Signed)
RX printed, left up front and patient aware to pick up via mychart

## 2017-03-11 DIAGNOSIS — E113313 Type 2 diabetes mellitus with moderate nonproliferative diabetic retinopathy with macular edema, bilateral: Secondary | ICD-10-CM | POA: Diagnosis not present

## 2017-03-11 LAB — HM DIABETES EYE EXAM

## 2017-03-25 ENCOUNTER — Encounter: Payer: Self-pay | Admitting: Family Medicine

## 2017-03-25 DIAGNOSIS — E11319 Type 2 diabetes mellitus with unspecified diabetic retinopathy without macular edema: Secondary | ICD-10-CM | POA: Insufficient documentation

## 2017-03-27 ENCOUNTER — Other Ambulatory Visit: Payer: Self-pay | Admitting: Family Medicine

## 2017-03-27 ENCOUNTER — Encounter: Payer: Self-pay | Admitting: Endocrinology

## 2017-03-27 MED ORDER — HYDROCODONE-ACETAMINOPHEN 7.5-325 MG PO TABS: 1.0000 | ORAL_TABLET | Freq: Four times a day (QID) | ORAL | 0 refills | Status: DC | PRN

## 2017-03-31 ENCOUNTER — Encounter: Payer: Self-pay | Admitting: Family Medicine

## 2017-04-09 ENCOUNTER — Ambulatory Visit: Payer: PPO | Admitting: Podiatry

## 2017-04-10 ENCOUNTER — Encounter: Payer: Self-pay | Admitting: Family Medicine

## 2017-04-10 DIAGNOSIS — E1165 Type 2 diabetes mellitus with hyperglycemia: Secondary | ICD-10-CM

## 2017-04-10 DIAGNOSIS — Z794 Long term (current) use of insulin: Principal | ICD-10-CM

## 2017-04-14 ENCOUNTER — Ambulatory Visit (INDEPENDENT_AMBULATORY_CARE_PROVIDER_SITE_OTHER): Payer: PPO | Admitting: Podiatry

## 2017-04-14 ENCOUNTER — Encounter: Payer: Self-pay | Admitting: Podiatry

## 2017-04-14 VITALS — BP 143/82 | HR 61

## 2017-04-14 DIAGNOSIS — B351 Tinea unguium: Secondary | ICD-10-CM | POA: Diagnosis not present

## 2017-04-14 DIAGNOSIS — E1151 Type 2 diabetes mellitus with diabetic peripheral angiopathy without gangrene: Secondary | ICD-10-CM

## 2017-04-14 DIAGNOSIS — M79676 Pain in unspecified toe(s): Secondary | ICD-10-CM

## 2017-04-14 DIAGNOSIS — E1142 Type 2 diabetes mellitus with diabetic polyneuropathy: Secondary | ICD-10-CM

## 2017-04-14 NOTE — Patient Instructions (Signed)

## 2017-04-14 NOTE — Progress Notes (Signed)
   Subjective:    Patient ID: [name]., male    DOB: [birth year], 76 y.o.   MRN: [id number]  HPI    Review of Systems  All other systems reviewed and are negative.      Objective:   Physical Exam        Assessment & Plan:

## 2017-04-14 NOTE — Progress Notes (Signed)
Patient ID: [name]., male   DOB: 07-23-1941, 76 y.o.   MRN: [id number]   Subjective: Patient presents for scheduled visit today complaining of thickened and elongated toenails which uncomfortable walking wearing shoes and request toenail debridement Patient is diabetic and denies history of foot ulceration, claudication or amputation Complains of intermittent sharp pains in his feet on and off weightbearing Patient's wife present treatment room  Objective: Mild peripheral pitting edema bilaterally DP pulses 0/4 bilaterally PT pulses 2/4 bilaterally Capillary reflex within normal limits bilaterally Sensation to 10 g monofilament wire intact 7/8 right and 5/8 left Vibratory sensation nonreactive right reactive left Ankle reflexes reactive bilaterally Open skin lesions bilaterally Atrophic can with absent hair growth bilaterally Brittle thickened elongated toenails with texture and color changes 6-10 Manual motor testing dorsi flexion, plantar flexion 5/5 bilaterally  Assessment: Diabetic peripheral vascular disease Diabetic peripheral neuropathy Symptomatic mycotic toenails 6-10  Plan: Debridement toenails 6-10 mechanically and electrically without a bleeding  Reappoint 3 months

## 2017-04-15 ENCOUNTER — Other Ambulatory Visit: Payer: Self-pay | Admitting: Family Medicine

## 2017-04-15 DIAGNOSIS — H35372 Puckering of macula, left eye: Secondary | ICD-10-CM | POA: Diagnosis not present

## 2017-04-15 DIAGNOSIS — E1165 Type 2 diabetes mellitus with hyperglycemia: Secondary | ICD-10-CM

## 2017-04-15 DIAGNOSIS — Z794 Long term (current) use of insulin: Principal | ICD-10-CM

## 2017-04-15 DIAGNOSIS — E113313 Type 2 diabetes mellitus with moderate nonproliferative diabetic retinopathy with macular edema, bilateral: Secondary | ICD-10-CM | POA: Diagnosis not present

## 2017-04-16 ENCOUNTER — Other Ambulatory Visit: Payer: Self-pay | Admitting: Family Medicine

## 2017-04-17 MED ORDER — HYDROCODONE-ACETAMINOPHEN 7.5-325 MG PO TABS: 1.0000 | ORAL_TABLET | Freq: Four times a day (QID) | ORAL | 0 refills | Status: DC | PRN

## 2017-04-29 ENCOUNTER — Ambulatory Visit: Payer: Self-pay | Admitting: Family Medicine

## 2017-04-29 ENCOUNTER — Ambulatory Visit (INDEPENDENT_AMBULATORY_CARE_PROVIDER_SITE_OTHER): Payer: PPO | Admitting: Family Medicine

## 2017-04-29 ENCOUNTER — Encounter: Payer: Self-pay | Admitting: Family Medicine

## 2017-04-29 VITALS — BP 134/68 | HR 68 | Temp 98.0°F | Resp 20 | Ht 69.0 in | Wt 214.0 lb

## 2017-04-29 DIAGNOSIS — G5601 Carpal tunnel syndrome, right upper limb: Secondary | ICD-10-CM

## 2017-04-29 NOTE — Progress Notes (Signed)
Subjective:    Patient ID: [name]., male    DOB: [birth year], 76 y.o.   MRN: [id number]  HPI Patient presents with burning pain in his right hand as well as numbness. Initially he localizes it to the dorsum of his right hand in the distribution of the radial nerve. However, he has no weakness with wrist extension. The interosseous muscles function normally. Finger abduction is not diminished. Grip strength is normal. Upon further history, the patient states that is worse at night. Frequently his hand will go numb while sitting in a chair. He also reports some numbness and tingling in the first second and third fingertips. On examination, I am able to reproduce the burning pain with Tinel's sign. Also Phalen sign, he developed numbness and burning in the hand. Therefore I believe the patient likely has carpal tunnel syndrome. He denies any neck pain or neck injury. Past Medical History:  Diagnosis Date  . Arthritis    lower back  . Chronic back pain    Disc disease  . Chronic kidney disease   . Coronary atherosclerosis of native coronary artery    Coronary calcifications by chest CT, Myoview demonstrating inferolateral scar  . Deafness in right ear   . Diabetes mellitus   . Diabetic retinopathy ([place])   . Diastolic dysfunction 11/1322   Grade 1.  . Essential hypertension, benign   . Heart murmur    in past  . HOH (hard of hearing)   . Hyperkalemia   . Kidney stones   . Mixed hyperlipidemia   . Pancreatic insufficiency    Diagnosed at [facility]  . Prostate cancer ([place]) 2011  . Shortness of breath dyspnea    occasional - liver pressing on right lung, decreased capacity  . Subdural hematoma ([place])   . Type 2 diabetes mellitus ([place])   . Wears hearing aid    "crossover" form right to left   Past Surgical History:  Procedure Laterality Date  . APPENDECTOMY    . Bilateral hip replacement    . CATARACT EXTRACTION W/PHACO Left 05/15/2015   Procedure: CATARACT EXTRACTION PHACO  AND INTRAOCULAR LENS PLACEMENT (IOC);  Surgeon: Ronnell Freshwater, MD;  Location: [place];  Service: Ophthalmology;  Laterality: Left;  DIABETIC - insulin pump and oral meds  . CATARACT EXTRACTION W/PHACO Right 08/28/2015   Procedure: CATARACT EXTRACTION PHACO AND INTRAOCULAR LENS PLACEMENT (IOC);  Surgeon: Ronnell Freshwater, MD;  Location: [place];  Service: Ophthalmology;  Laterality: Right;  DIABETIC PER PT AND CINDY PLEASE KEEP ARRIVAL TIME AFTER 8AM   . CHOLECYSTECTOMY    . HERNIA REPAIR    . PROSTATECTOMY  2011   Current Outpatient Prescriptions on File Prior to Visit  Medication Sig Dispense Refill  . aspirin EC 81 MG tablet Take 81 mg by mouth at bedtime.     . carvedilol (COREG) 25 MG tablet Take 1 tablet (25 mg total) by mouth 2 (two) times daily. 180 tablet 3  . citalopram (CELEXA) 20 MG tablet Take 1 tablet (20 mg total) by mouth daily. 90 tablet 4  . diazepam (VALIUM) 5 MG tablet Take 1 tablet (5 mg total) by mouth 2 (two) times daily as needed. 60 tablet 1  . doxazosin (CARDURA) 8 MG tablet Take 1 tablet (8 mg total) by mouth daily. 90 tablet 3  . FREESTYLE LITE test strip USE 1 STRIP TO TEST BLOOD SUGAR THREE TIMES DAILY 350 each 2  . furosemide (LASIX) 20 MG  tablet TAKE 1 TABLET(20 MG) BY MOUTH DAILY 90 tablet 3  . gabapentin (NEURONTIN) 300 MG capsule Take 2 capsules (600 mg total) by mouth at bedtime. 180 capsule 3  . HYDROcodone-acetaminophen (NORCO) 7.5-325 MG tablet Take 1 tablet by mouth every 6 (six) hours as needed for moderate pain. 60 tablet 0  . insulin NPH Human (HUMULIN N,NOVOLIN N) 100 UNIT/ML injection 12u qam - 8 u qhs    . insulin regular (NOVOLIN R,HUMULIN R) 100 units/mL injection 10 u qam, 5-6 units at lunch and 8-10 u at dinner    . losartan (COZAAR) 50 MG tablet Take 1 tablet (50 mg total) by mouth daily. 90 tablet 3  . Multiple Vitamin (MULTIVITAMIN WITH MINERALS) TABS Take 1 tablet by mouth daily.    . Pancrelipase,  Lip-Prot-Amyl, 10000 units CPEP Take 1 capsule (10,000 Units total) by mouth 3 (three) times daily before meals. 90 capsule 3  . pravastatin (PRAVACHOL) 10 MG tablet Take 1 tablet (10 mg total) by mouth daily with breakfast. 90 tablet 3   No current facility-administered medications on file prior to visit.    Allergies  Allergen Reactions  . Enalapril Maleate Cough   Social History   Social History  . Marital status: Married    Spouse name: N/A  . Number of children: N/A  . Years of education: N/A   Occupational History  . Not on file.   Social History Main Topics  . Smoking status: Never Smoker  . Smokeless tobacco: Never Used  . Alcohol use No  . Drug use: No  . Sexual activity: Not on file   Other Topics Concern  . Not on file   Social History Narrative  . No narrative on file      Review of Systems  All other systems reviewed and are negative.      Objective:   Physical Exam  Cardiovascular: Normal rate, regular rhythm and normal heart sounds.   Pulmonary/Chest: Effort normal and breath sounds normal.  Musculoskeletal:       Right hand: He exhibits disruption of two-point discrimination. He exhibits normal range of motion, no tenderness, no bony tenderness and normal capillary refill. Decreased sensation noted. Decreased sensation is present in the medial distribution and is present in the radial distribution. Normal strength noted.  Vitals reviewed.         Assessment & Plan:  Carpal tunnel syndrome on right  I believe the patient has carpal tunnel syndrome. We discussed his treatment options. We can schedule nerve conduction studies to be 100% sure, but the patient elected to try an empiric cortisone injection first. Using sterile technique, I injected the carpal tunnel space with 1 mL of lidocaine and 1 mL of 40 mg per mL Kenalog. The injection was performed at the flexor crease just lateral to the palmaris longus tendon. Prior to injection, the area  was aspirated to ensure that we are not introducing medication and to a blood vessel. Patient tolerated the procedure well without complication.

## 2017-05-01 ENCOUNTER — Ambulatory Visit: Payer: PPO | Admitting: Endocrinology

## 2017-05-05 ENCOUNTER — Other Ambulatory Visit: Payer: Self-pay | Admitting: Family Medicine

## 2017-05-05 MED ORDER — HYDROCODONE-ACETAMINOPHEN 7.5-325 MG PO TABS: 1.0000 | ORAL_TABLET | Freq: Four times a day (QID) | ORAL | 0 refills | Status: DC | PRN

## 2017-05-06 ENCOUNTER — Ambulatory Visit: Payer: PPO | Admitting: "Endocrinology

## 2017-05-23 ENCOUNTER — Other Ambulatory Visit: Payer: Self-pay | Admitting: Family Medicine

## 2017-05-23 DIAGNOSIS — K861 Other chronic pancreatitis: Secondary | ICD-10-CM

## 2017-05-26 ENCOUNTER — Other Ambulatory Visit: Payer: Self-pay | Admitting: Family Medicine

## 2017-05-26 MED ORDER — HYDROCODONE-ACETAMINOPHEN 7.5-325 MG PO TABS: 1.0000 | ORAL_TABLET | Freq: Four times a day (QID) | ORAL | 0 refills | Status: DC | PRN

## 2017-06-05 ENCOUNTER — Other Ambulatory Visit: Payer: Self-pay | Admitting: Family Medicine

## 2017-06-06 NOTE — Telephone Encounter (Signed)
Ok to refill 

## 2017-06-06 NOTE — Telephone Encounter (Signed)
ok 

## 2017-06-06 NOTE — Telephone Encounter (Signed)
Medication called to pharmacy. 

## 2017-06-17 ENCOUNTER — Other Ambulatory Visit: Payer: Self-pay | Admitting: Family Medicine

## 2017-06-17 MED ORDER — HYDROCODONE-ACETAMINOPHEN 7.5-325 MG PO TABS: 1.0000 | ORAL_TABLET | Freq: Four times a day (QID) | ORAL | 0 refills | Status: DC | PRN

## 2017-06-17 NOTE — Telephone Encounter (Signed)
ok 

## 2017-06-17 NOTE — Telephone Encounter (Signed)
Ok to refill 

## 2017-07-10 ENCOUNTER — Other Ambulatory Visit: Payer: Self-pay | Admitting: Family Medicine

## 2017-07-10 ENCOUNTER — Encounter: Payer: Self-pay | Admitting: Family Medicine

## 2017-07-10 MED ORDER — HYDROCODONE-ACETAMINOPHEN 7.5-325 MG PO TABS: 1.0000 | ORAL_TABLET | Freq: Four times a day (QID) | ORAL | 0 refills | Status: DC | PRN

## 2017-07-10 NOTE — Telephone Encounter (Signed)
ok 

## 2017-07-10 NOTE — Telephone Encounter (Signed)
Ok to refill 

## 2017-07-14 ENCOUNTER — Ambulatory Visit: Payer: PPO | Admitting: Podiatry

## 2017-07-24 DIAGNOSIS — E1159 Type 2 diabetes mellitus with other circulatory complications: Secondary | ICD-10-CM | POA: Diagnosis not present

## 2017-07-24 DIAGNOSIS — I10 Essential (primary) hypertension: Secondary | ICD-10-CM | POA: Diagnosis not present

## 2017-07-24 DIAGNOSIS — E113293 Type 2 diabetes mellitus with mild nonproliferative diabetic retinopathy without macular edema, bilateral: Secondary | ICD-10-CM | POA: Diagnosis not present

## 2017-07-24 DIAGNOSIS — E1142 Type 2 diabetes mellitus with diabetic polyneuropathy: Secondary | ICD-10-CM | POA: Diagnosis not present

## 2017-07-24 DIAGNOSIS — E782 Mixed hyperlipidemia: Secondary | ICD-10-CM | POA: Diagnosis not present

## 2017-07-24 DIAGNOSIS — Z794 Long term (current) use of insulin: Secondary | ICD-10-CM | POA: Diagnosis not present

## 2017-07-24 DIAGNOSIS — E1165 Type 2 diabetes mellitus with hyperglycemia: Secondary | ICD-10-CM | POA: Diagnosis not present

## 2017-07-31 ENCOUNTER — Encounter: Payer: Self-pay | Admitting: Family Medicine

## 2017-08-01 ENCOUNTER — Telehealth: Payer: Self-pay | Admitting: Family Medicine

## 2017-08-01 MED ORDER — HYDROCODONE-ACETAMINOPHEN 7.5-325 MG PO TABS: 1.0000 | ORAL_TABLET | Freq: Four times a day (QID) | ORAL | 0 refills | Status: DC | PRN

## 2017-08-01 NOTE — Telephone Encounter (Signed)
See my chart note.

## 2017-08-01 NOTE — Telephone Encounter (Signed)
LMOVM as to what pharm he would like this sent

## 2017-08-01 NOTE — Telephone Encounter (Signed)
Patient is requesting a refill on Hydrocodone - Ok to refill??       LRF - 07/11/17  Pt is not out yet but wanted to go ahead and get rx before the weekend.

## 2017-08-01 NOTE — Telephone Encounter (Signed)
Patient is calling to get rx for his hydrocodone  [phone]

## 2017-08-18 ENCOUNTER — Ambulatory Visit: Payer: PPO | Admitting: Podiatry

## 2017-08-18 DIAGNOSIS — E113313 Type 2 diabetes mellitus with moderate nonproliferative diabetic retinopathy with macular edema, bilateral: Secondary | ICD-10-CM | POA: Diagnosis not present

## 2017-08-18 LAB — HM DIABETES EYE EXAM

## 2017-08-20 ENCOUNTER — Other Ambulatory Visit: Payer: Self-pay | Admitting: Family Medicine

## 2017-08-20 ENCOUNTER — Encounter: Payer: Self-pay | Admitting: Family Medicine

## 2017-08-20 NOTE — Telephone Encounter (Signed)
Ok to refill??  Last office visit 04/29/2017.  Last refill 08/01/2017.

## 2017-08-21 ENCOUNTER — Encounter: Payer: Self-pay | Admitting: Family Medicine

## 2017-08-21 NOTE — Telephone Encounter (Signed)
Not due until 1/28.  Ok to refill 1/27.

## 2017-08-22 MED ORDER — HYDROCODONE-ACETAMINOPHEN 7.5-325 MG PO TABS: 1.0000 | ORAL_TABLET | Freq: Four times a day (QID) | ORAL | 0 refills | Status: DC | PRN

## 2017-08-27 MED ORDER — HYDROCODONE-ACETAMINOPHEN 7.5-325 MG PO TABS: 1.0000 | ORAL_TABLET | Freq: Four times a day (QID) | ORAL | 0 refills | Status: DC | PRN

## 2017-08-27 NOTE — Addendum Note (Signed)
Addended by: Shary Decamp B on: 08/27/2017 10:59 AM   Modules accepted: Orders

## 2017-08-29 ENCOUNTER — Ambulatory Visit (INDEPENDENT_AMBULATORY_CARE_PROVIDER_SITE_OTHER): Payer: PPO | Admitting: Family Medicine

## 2017-08-29 ENCOUNTER — Encounter: Payer: Self-pay | Admitting: Family Medicine

## 2017-08-29 VITALS — BP 156/74 | HR 70 | Temp 98.5°F | Resp 16 | Ht 69.0 in | Wt 212.0 lb

## 2017-08-29 DIAGNOSIS — R6889 Other general symptoms and signs: Secondary | ICD-10-CM

## 2017-08-29 LAB — INFLUENZA A AND B AG, IMMUNOASSAY
INFLUENZA A ANTIGEN: NOT DETECTED
INFLUENZA B ANTIGEN: NOT DETECTED

## 2017-08-29 NOTE — Progress Notes (Signed)
Subjective:    Patient ID: [name]., male    DOB: 02-02-1941, 77 y.o.   MRN: [id number]  HPI Patient reports a one-week history of head congestion, rhinorrhea, chest congestion, and cough productive of green sputum. He is concerned that he has the flu. He denies any fevers or chills. He denies any shortness of breath or chest pain. He denies any sinus pain or otalgia. Patient's exam is significant for bibasilar crackles which are chronic in this patient otherwise the remainder of his exam is significant only for clear rhinorrhea Past Medical History:  Diagnosis Date  . Arthritis    lower back  . Chronic back pain    Disc disease  . Chronic kidney disease   . Coronary atherosclerosis of native coronary artery    Coronary calcifications by chest CT, Myoview demonstrating inferolateral scar  . Deafness in right ear   . Diabetes mellitus   . Diabetic retinopathy ([place])   . Diastolic dysfunction 10/3005   Grade 1.  . Essential hypertension, benign   . Heart murmur    in past  . HOH (hard of hearing)   . Hyperkalemia   . Kidney stones   . Mixed hyperlipidemia   . Pancreatic insufficiency    Diagnosed at [facility]  . Prostate cancer ([place]) 2011  . Shortness of breath dyspnea    occasional - liver pressing on right lung, decreased capacity  . Subdural hematoma ([place])   . Type 2 diabetes mellitus ([place])   . Wears hearing aid    "crossover" form right to left   Past Surgical History:  Procedure Laterality Date  . APPENDECTOMY    . Bilateral hip replacement    . CATARACT EXTRACTION W/PHACO Left 05/15/2015   Procedure: CATARACT EXTRACTION PHACO AND INTRAOCULAR LENS PLACEMENT (IOC);  Surgeon: Ronnell Freshwater, MD;  Location: [place];  Service: Ophthalmology;  Laterality: Left;  DIABETIC - insulin pump and oral meds  . CATARACT EXTRACTION W/PHACO Right 08/28/2015   Procedure: CATARACT EXTRACTION PHACO AND INTRAOCULAR LENS PLACEMENT (IOC);  Surgeon: Ronnell Freshwater, MD;  Location: [place];  Service: Ophthalmology;  Laterality: Right;  DIABETIC PER PT AND CINDY PLEASE KEEP ARRIVAL TIME AFTER 8AM   . CHOLECYSTECTOMY    . HERNIA REPAIR    . PROSTATECTOMY  2011   Current Outpatient Medications on File Prior to Visit  Medication Sig Dispense Refill  . aspirin EC 81 MG tablet Take 81 mg by mouth at bedtime.     . carvedilol (COREG) 25 MG tablet Take 1 tablet (25 mg total) by mouth 2 (two) times daily. 180 tablet 3  . citalopram (CELEXA) 20 MG tablet Take 1 tablet (20 mg total) by mouth daily. 90 tablet 4  . diazepam (VALIUM) 5 MG tablet TAKE 1 TABLET BY MOUTH TWICE DAILY AS NEEDED FOR ANXIETY 60 tablet 0  . doxazosin (CARDURA) 8 MG tablet Take 1 tablet (8 mg total) by mouth daily. 90 tablet 3  . FREESTYLE LITE test strip USE 1 STRIP TO TEST BLOOD SUGAR THREE TIMES DAILY 350 each 2  . furosemide (LASIX) 20 MG tablet TAKE 1 TABLET(20 MG) BY MOUTH DAILY 90 tablet 3  . gabapentin (NEURONTIN) 300 MG capsule Take 2 capsules (600 mg total) by mouth at bedtime. 180 capsule 3  . HYDROcodone-acetaminophen (NORCO) 7.5-325 MG tablet Take 1 tablet by mouth every 6 (six) hours as needed for moderate pain. 60 tablet 0  . insulin NPH Human (HUMULIN N,NOVOLIN  N) 100 UNIT/ML injection 12u qam - 8 u qhs    . insulin regular (NOVOLIN R,HUMULIN R) 100 units/mL injection 10 u qam, 5-6 units at lunch and 8-10 u at dinner    . losartan (COZAAR) 50 MG tablet Take 1 tablet (50 mg total) by mouth daily. 90 tablet 3  . Multiple Vitamin (MULTIVITAMIN WITH MINERALS) TABS Take 1 tablet by mouth daily.    . pravastatin (PRAVACHOL) 10 MG tablet Take 1 tablet (10 mg total) by mouth daily with breakfast. 90 tablet 3  . ZENPEP 10000-32000 units CPEP TAKE 1 CAPSULE BY MOUTH THREE TIMES DAILY BEFORE MEALS 90 capsule 1   No current facility-administered medications on file prior to visit.    Allergies  Allergen Reactions  . Enalapril Maleate Cough   Social History    Socioeconomic History  . Marital status: Married    Spouse name: Not on file  . Number of children: Not on file  . Years of education: Not on file  . Highest education level: Not on file  Social Needs  . Financial resource strain: Not on file  . Food insecurity - worry: Not on file  . Food insecurity - inability: Not on file  . Transportation needs - medical: Not on file  . Transportation needs - non-medical: Not on file  Occupational History  . Not on file  Tobacco Use  . Smoking status: Never Smoker  . Smokeless tobacco: Never Used  Substance and Sexual Activity  . Alcohol use: No  . Drug use: No  . Sexual activity: Not on file  Other Topics Concern  . Not on file  Social History Narrative  . Not on file      Review of Systems  All other systems reviewed and are negative.      Objective:   Physical Exam  Constitutional: He appears well-developed and well-nourished. No distress.  HENT:  Right Ear: External ear normal.  Left Ear: External ear normal.  Nose: Nose normal.  Mouth/Throat: Oropharynx is clear and moist. No oropharyngeal exudate.  Eyes: Conjunctivae are normal.  Neck: Neck supple.  Cardiovascular: Normal rate, regular rhythm and normal heart sounds.  No murmur heard. Pulmonary/Chest: Effort normal. No respiratory distress. He has no wheezes. He has rhonchi in the right lower field. He has no rales.      Abdominal: Soft. Bowel sounds are normal. He exhibits no distension. There is no tenderness. There is no rebound and no guarding.  Lymphadenopathy:    He has no cervical adenopathy.  Skin: No rash noted. He is not diaphoretic. No erythema.  Vitals reviewed.         Assessment & Plan:  Flu-like symptoms - Plan: Influenza A/B Flu test is negative. Symptoms are consistent with a viral upper respiratory infection. Patient states that he feels better today than he has all week. He's only coughed once today. Symptoms seem to be abating on their  own. I recommended continued tincture of time and I anticipate continued resolution of symptoms over the next 24-48 hours. Recheck immediately if worsening I will obtain a flu test, I suspect viral upper respiratory infection. Patient states the symptoms are gradually improving.

## 2017-09-01 ENCOUNTER — Encounter: Payer: Self-pay | Admitting: Family Medicine

## 2017-09-01 MED ORDER — AZITHROMYCIN 250 MG PO TABS: ORAL_TABLET | ORAL | 0 refills | Status: DC

## 2017-09-01 NOTE — Telephone Encounter (Signed)
Medication called/sent to requested pharmacy  

## 2017-09-04 ENCOUNTER — Other Ambulatory Visit: Payer: Self-pay | Admitting: Family Medicine

## 2017-09-04 NOTE — Telephone Encounter (Signed)
Requesting refill    Valium  LOV: 08/29/17  LRF:  06/06/17

## 2017-09-05 ENCOUNTER — Other Ambulatory Visit: Payer: Self-pay | Admitting: Family Medicine

## 2017-09-05 DIAGNOSIS — K861 Other chronic pancreatitis: Secondary | ICD-10-CM

## 2017-09-08 ENCOUNTER — Ambulatory Visit: Payer: PPO | Admitting: Podiatry

## 2017-09-11 ENCOUNTER — Encounter: Payer: Self-pay | Admitting: Podiatry

## 2017-09-11 ENCOUNTER — Ambulatory Visit: Payer: PPO | Admitting: Podiatry

## 2017-09-11 DIAGNOSIS — M79676 Pain in unspecified toe(s): Secondary | ICD-10-CM

## 2017-09-11 DIAGNOSIS — B351 Tinea unguium: Secondary | ICD-10-CM | POA: Diagnosis not present

## 2017-09-11 NOTE — Progress Notes (Signed)
Subjective:   Patient ID: Mark Jumbo Sr., male   DOB: 77 y.o.   MRN: [id number]   HPI Patient presents with nail disease 1-5 both feet that are painful and make shoe gear difficult   ROS      Objective:  Physical Exam  Thick yellow brittle nailbeds 1-5 both feet that are painful     Assessment:  Mycotic nail infection with pain 1-5 both feet     Plan:  Debride painful nailbeds 1-5 both feet with no iatrogenic bleeding

## 2017-09-15 DIAGNOSIS — E119 Type 2 diabetes mellitus without complications: Secondary | ICD-10-CM | POA: Diagnosis not present

## 2017-09-15 DIAGNOSIS — L84 Corns and callosities: Secondary | ICD-10-CM | POA: Diagnosis not present

## 2017-09-25 ENCOUNTER — Encounter: Payer: Self-pay | Admitting: Family Medicine

## 2017-09-25 MED ORDER — HYDROCODONE-ACETAMINOPHEN 7.5-325 MG PO TABS: 1.0000 | ORAL_TABLET | Freq: Four times a day (QID) | ORAL | 0 refills | Status: DC | PRN

## 2017-09-25 NOTE — Telephone Encounter (Signed)
Patient is requesting a refill on Hydrocodone   LOV: 08/29/17  LRF:   08/27/17

## 2017-10-01 ENCOUNTER — Other Ambulatory Visit: Payer: Self-pay | Admitting: Family Medicine

## 2017-10-01 DIAGNOSIS — K861 Other chronic pancreatitis: Secondary | ICD-10-CM

## 2017-10-20 ENCOUNTER — Encounter: Payer: Self-pay | Admitting: Family Medicine

## 2017-10-21 ENCOUNTER — Other Ambulatory Visit: Payer: Self-pay | Admitting: Family Medicine

## 2017-10-21 DIAGNOSIS — Z794 Long term (current) use of insulin: Secondary | ICD-10-CM | POA: Diagnosis not present

## 2017-10-21 DIAGNOSIS — E113293 Type 2 diabetes mellitus with mild nonproliferative diabetic retinopathy without macular edema, bilateral: Secondary | ICD-10-CM | POA: Diagnosis not present

## 2017-10-21 DIAGNOSIS — E1159 Type 2 diabetes mellitus with other circulatory complications: Secondary | ICD-10-CM | POA: Diagnosis not present

## 2017-10-21 DIAGNOSIS — I10 Essential (primary) hypertension: Secondary | ICD-10-CM | POA: Diagnosis not present

## 2017-10-21 DIAGNOSIS — E1142 Type 2 diabetes mellitus with diabetic polyneuropathy: Secondary | ICD-10-CM | POA: Diagnosis not present

## 2017-10-21 MED ORDER — HYDROCODONE-ACETAMINOPHEN 7.5-325 MG PO TABS: 1.0000 | ORAL_TABLET | Freq: Four times a day (QID) | ORAL | 0 refills | Status: DC | PRN

## 2017-11-17 ENCOUNTER — Encounter: Payer: Self-pay | Admitting: Family Medicine

## 2017-11-18 MED ORDER — HYDROCODONE-ACETAMINOPHEN 7.5-325 MG PO TABS: 1.0000 | ORAL_TABLET | Freq: Four times a day (QID) | ORAL | 0 refills | Status: DC | PRN

## 2017-11-18 NOTE — Telephone Encounter (Signed)
Patient is requesting a refill on Hydrocodone   LOV: 08/29/17  LRF:   10/21/17

## 2017-11-25 ENCOUNTER — Ambulatory Visit (INDEPENDENT_AMBULATORY_CARE_PROVIDER_SITE_OTHER): Payer: PPO | Admitting: Family Medicine

## 2017-11-25 ENCOUNTER — Encounter: Payer: Self-pay | Admitting: Family Medicine

## 2017-11-25 VITALS — BP 100/62 | HR 76 | Temp 98.2°F | Resp 18 | Ht 69.0 in | Wt 210.0 lb

## 2017-11-25 DIAGNOSIS — M6281 Muscle weakness (generalized): Secondary | ICD-10-CM | POA: Diagnosis not present

## 2017-11-25 LAB — COMPLETE METABOLIC PANEL WITH GFR
AG Ratio: 1.5 (calc) (ref 1.0–2.5)
ALT: 65 U/L — AB (ref 9–46)
AST: 46 U/L — ABNORMAL HIGH (ref 10–35)
Albumin: 3.4 g/dL — ABNORMAL LOW (ref 3.6–5.1)
Alkaline phosphatase (APISO): 172 U/L — ABNORMAL HIGH (ref 40–115)
BUN/Creatinine Ratio: 17 (calc) (ref 6–22)
BUN: 22 mg/dL (ref 7–25)
CALCIUM: 8.2 mg/dL — AB (ref 8.6–10.3)
CO2: 29 mmol/L (ref 20–32)
CREATININE: 1.31 mg/dL — AB (ref 0.70–1.18)
Chloride: 102 mmol/L (ref 98–110)
GFR, EST NON AFRICAN AMERICAN: 53 mL/min/{1.73_m2} — AB (ref >=60)
GFR, Est African American: 61 mL/min/{1.73_m2} (ref >=60)
Globulin: 2.3 g/dL (calc) (ref 1.9–3.7)
Glucose, Bld: 454 mg/dL — ABNORMAL HIGH (ref 65–99)
Potassium: 4.8 mmol/L (ref 3.5–5.3)
Sodium: 135 mmol/L (ref 135–146)
Total Bilirubin: 0.5 mg/dL (ref 0.2–1.2)
Total Protein: 5.7 g/dL — ABNORMAL LOW (ref 6.1–8.1)

## 2017-11-25 LAB — VITAMIN B12: Vitamin B-12: 789 pg/mL (ref 200–1100)

## 2017-11-25 LAB — SEDIMENTATION RATE: Sed Rate: 2 mm/h (ref 0–20)

## 2017-11-25 LAB — TSH: TSH: 2.76 m[IU]/L (ref 0.40–4.50)

## 2017-11-25 LAB — CK: CK TOTAL: 192 U/L (ref 44–196)

## 2017-11-25 NOTE — Progress Notes (Signed)
Subjective:    Patient ID: [name]., male    DOB: 1940-12-05, 77 y.o.   MRN: [id number]  HPI  Patient presents today complaining of fatigue.  However when I questioned the patient further, he denies any chest pain, shortness of breath, or decreased energy.  What he is referring to his weakness in his legs.  He states that in the morning, he feels fine, he is able to walk out to his shop.  However after walking and standing for any length of time, his legs become extremely weak, wobbly, and he has to go sit down and rest.  He has no muscle stamina or endurance in his legs.  He has a very difficult time rising from a chair.  For instance today, when I asked the patient to stand to go to the lab for lab work, he can barely push himself out of the chair.  His hip extensors are extremely weak.  However he denies any weakness in the muscles in his arms.  He denies any muscle pain.  He denies any myalgias or arthralgias.  He does complain of chronic low back pain however he denies any new joint pain or new symmetric polyarthritis.  He denies any claudication in his legs.  He denies any cramps with walking.  He denies any numbness or tingling in his legs.  He denies any sciatica-like symptoms.  He denies any paresthesias or saddle numbness Past Medical History:  Diagnosis Date  . Arthritis    lower back  . Chronic back pain    Disc disease  . Chronic kidney disease   . Coronary atherosclerosis of native coronary artery    Coronary calcifications by chest CT, Myoview demonstrating inferolateral scar  . Deafness in right ear   . Diabetes mellitus   . Diabetic retinopathy ([place])   . Diastolic dysfunction 02/3219   Grade 1.  . Essential hypertension, benign   . Heart murmur    in past  . HOH (hard of hearing)   . Hyperkalemia   . Kidney stones   . Mixed hyperlipidemia   . Pancreatic insufficiency    Diagnosed at [facility]  . Prostate cancer ([place]) 2011  . Shortness of breath dyspnea    occasional - liver pressing on right lung, decreased capacity  . Subdural hematoma ([place])   . Type 2 diabetes mellitus ([place])   . Wears hearing aid    "crossover" form right to left   Past Surgical History:  Procedure Laterality Date  . APPENDECTOMY    . Bilateral hip replacement    . CATARACT EXTRACTION W/PHACO Left 05/15/2015   Procedure: CATARACT EXTRACTION PHACO AND INTRAOCULAR LENS PLACEMENT (IOC);  Surgeon: Ronnell Freshwater, MD;  Location: [place];  Service: Ophthalmology;  Laterality: Left;  DIABETIC - insulin pump and oral meds  . CATARACT EXTRACTION W/PHACO Right 08/28/2015   Procedure: CATARACT EXTRACTION PHACO AND INTRAOCULAR LENS PLACEMENT (IOC);  Surgeon: Ronnell Freshwater, MD;  Location: [place];  Service: Ophthalmology;  Laterality: Right;  DIABETIC PER PT AND CINDY PLEASE KEEP ARRIVAL TIME AFTER 8AM   . CHOLECYSTECTOMY    . HERNIA REPAIR    . PROSTATECTOMY  2011   Current Outpatient Medications on File Prior to Visit  Medication Sig Dispense Refill  . aspirin EC 81 MG tablet Take 81 mg by mouth at bedtime.     . carvedilol (COREG) 25 MG tablet Take 1 tablet (25 mg total) by mouth 2 (two) times  daily. 180 tablet 3  . citalopram (CELEXA) 20 MG tablet Take 1 tablet (20 mg total) by mouth daily. 90 tablet 4  . diazepam (VALIUM) 5 MG tablet TAKE 1 TABLET BY MOUTH TWICE DAILY AS NEEDED FOR ANXIETY 60 tablet 1  . doxazosin (CARDURA) 8 MG tablet Take 1 tablet (8 mg total) by mouth daily. 90 tablet 3  . FREESTYLE LITE test strip USE 1 STRIP TO TEST BLOOD SUGAR THREE TIMES DAILY 350 each 2  . furosemide (LASIX) 20 MG tablet TAKE 1 TABLET(20 MG) BY MOUTH DAILY 90 tablet 3  . gabapentin (NEURONTIN) 300 MG capsule Take 2 capsules (600 mg total) by mouth at bedtime. 180 capsule 3  . HYDROcodone-acetaminophen (NORCO) 7.5-325 MG tablet Take 1 tablet by mouth every 6 (six) hours as needed for moderate pain (Chronic pain Dx: G89.4). 60 tablet 0  .  insulin NPH Human (HUMULIN N,NOVOLIN N) 100 UNIT/ML injection 12u qam - 8 u qhs    . insulin regular (NOVOLIN R,HUMULIN R) 100 units/mL injection 10 u qam, 5-6 units at lunch and 8-10 u at dinner    . losartan (COZAAR) 100 MG tablet Take by mouth.    . metFORMIN (GLUCOPHAGE-XR) 500 MG 24 hr tablet Take by mouth. 1 tab po qam & 2 po qpm    . Multiple Vitamin (MULTIVITAMIN WITH MINERALS) TABS Take 1 tablet by mouth daily.    . pravastatin (PRAVACHOL) 10 MG tablet Take 1 tablet (10 mg total) by mouth daily with breakfast. 90 tablet 3  . ZENPEP 10000-32000 units CPEP TAKE 1 CAPSULE BY MOUTH THREE TIMES DAILY BEFORE MEALS 180 capsule 3   No current facility-administered medications on file prior to visit.    Allergies  Allergen Reactions  . Enalapril Maleate Cough   Social History   Socioeconomic History  . Marital status: Married    Spouse name: Not on file  . Number of children: Not on file  . Years of education: Not on file  . Highest education level: Not on file  Occupational History  . Not on file  Social Needs  . Financial resource strain: Not on file  . Food insecurity:    Worry: Not on file    Inability: Not on file  . Transportation needs:    Medical: Not on file    Non-medical: Not on file  Tobacco Use  . Smoking status: Never Smoker  . Smokeless tobacco: Never Used  Substance and Sexual Activity  . Alcohol use: No  . Drug use: No  . Sexual activity: Not on file  Lifestyle  . Physical activity:    Days per week: Not on file    Minutes per session: Not on file  . Stress: Not on file  Relationships  . Social connections:    Talks on phone: Not on file    Gets together: Not on file    Attends religious service: Not on file    Active member of club or organization: Not on file    Attends meetings of clubs or organizations: Not on file    Relationship status: Not on file  . Intimate partner violence:    Fear of current or ex partner: Not on file    Emotionally  abused: Not on file    Physically abused: Not on file    Forced sexual activity: Not on file  Other Topics Concern  . Not on file  Social History Narrative  . Not on file  Review of Systems  All other systems reviewed and are negative.      Objective:   Physical Exam  Constitutional: He is oriented to person, place, and time. He appears well-developed and well-nourished. No distress.  Cardiovascular: Normal rate, regular rhythm and normal heart sounds.  Pulmonary/Chest: Effort normal and breath sounds normal. No respiratory distress. He has no wheezes. He has no rales.  Musculoskeletal: He exhibits no edema.  Neurological: He is alert and oriented to person, place, and time. No cranial nerve deficit or sensory deficit. He exhibits abnormal muscle tone. Coordination and gait normal.  Skin: He is not diaphoretic.  Vitals reviewed.         Assessment & Plan:  Muscle weakness of lower extremity - Plan: Testosterone, Vitamin B12, CK, TSH, COMPLETE METABOLIC PANEL WITH GFR, Sedimentation rate  I believe the muscle weakness in his legs is likely due to deconditioning and is multifactorial in nature.  He denies any symptoms of claudication and therefore I do not believe this is due to peripheral vascular disease.  He denies any muscle aches and therefore I do not believe the patient has myositis, PMR, or statin-induced myopathy.  Seems to be isolated to his lower extremities and therefore I do not believe this is indicative of underlying neuromuscular disorder.  He denies any diplopia.  He denies any trouble swallowing.  Therefore I believe the most prudent course of therapy would be physical therapy in an effort to improve the conditioning in his legs.  I will check lab work pertaining to fatigue and deconditioning and muscle weakness including a testosterone level, TSH, vitamin B12, CK, sed rate, and a CMP to evaluate for hypokalemia, etc. if lab work is normal, I would recommend nerve  conduction studies with EMG to evaluate for any evidence of a neuromuscular disorder and if this is normal, I would recommend physical therapy for conditioning.

## 2017-11-26 LAB — TESTOSTERONE: TESTOSTERONE: 295 ng/dL (ref 250–827)

## 2017-11-27 ENCOUNTER — Other Ambulatory Visit: Payer: Self-pay | Admitting: Family Medicine

## 2017-11-27 ENCOUNTER — Encounter (INDEPENDENT_AMBULATORY_CARE_PROVIDER_SITE_OTHER): Payer: Self-pay

## 2017-11-27 DIAGNOSIS — R7989 Other specified abnormal findings of blood chemistry: Secondary | ICD-10-CM

## 2017-11-27 DIAGNOSIS — R945 Abnormal results of liver function studies: Principal | ICD-10-CM

## 2017-12-01 ENCOUNTER — Ambulatory Visit (HOSPITAL_COMMUNITY)
Admission: RE | Admit: 2017-12-01 | Discharge: 2017-12-01 | Disposition: A | Discharge status: Home / Self Care | Payer: PPO | Source: Ambulatory Visit | Attending: Family Medicine | Admitting: Family Medicine

## 2017-12-01 DIAGNOSIS — R748 Abnormal levels of other serum enzymes: Secondary | ICD-10-CM | POA: Diagnosis not present

## 2017-12-01 DIAGNOSIS — Z9049 Acquired absence of other specified parts of digestive tract: Secondary | ICD-10-CM | POA: Diagnosis not present

## 2017-12-01 DIAGNOSIS — R7989 Other specified abnormal findings of blood chemistry: Secondary | ICD-10-CM

## 2017-12-01 DIAGNOSIS — R932 Abnormal findings on diagnostic imaging of liver and biliary tract: Secondary | ICD-10-CM | POA: Diagnosis not present

## 2017-12-01 DIAGNOSIS — R945 Abnormal results of liver function studies: Secondary | ICD-10-CM | POA: Diagnosis not present

## 2017-12-02 ENCOUNTER — Encounter (INDEPENDENT_AMBULATORY_CARE_PROVIDER_SITE_OTHER): Payer: Self-pay

## 2017-12-09 ENCOUNTER — Other Ambulatory Visit: Payer: Self-pay | Admitting: Family Medicine

## 2017-12-09 ENCOUNTER — Encounter: Payer: Self-pay | Admitting: Family Medicine

## 2017-12-09 ENCOUNTER — Ambulatory Visit (INDEPENDENT_AMBULATORY_CARE_PROVIDER_SITE_OTHER): Payer: PPO | Admitting: Family Medicine

## 2017-12-09 VITALS — BP 108/58 | HR 74 | Temp 97.5°F | Resp 14 | Ht 69.0 in | Wt 209.0 lb

## 2017-12-09 DIAGNOSIS — K7581 Nonalcoholic steatohepatitis (NASH): Secondary | ICD-10-CM | POA: Diagnosis not present

## 2017-12-09 DIAGNOSIS — K760 Fatty (change of) liver, not elsewhere classified: Secondary | ICD-10-CM | POA: Insufficient documentation

## 2017-12-09 MED ORDER — OXYCODONE HCL 5 MG PO TABS: 5.0000 mg | ORAL_TABLET | ORAL | 0 refills | Status: DC | PRN

## 2017-12-09 NOTE — Progress Notes (Signed)
Subjective:     Patient ID: Mark Hose., male   DOB: [birth year], 77 y.o.   MRN: [id number]  HPI  At a recent office visit, the patient was found to have mild elevations of his AST and ALT.  At that point, we recommended that he temporarily discontinue his pravastatin and try to avoid taking his pain medication which has Tylenol in it and then return in 2 weeks to recheck his CMP.  However we also obtain a right upper quadrant ultrasound.  Ultrasound revealed diffuse increased liver echogenicity consistent with hepatic steatosis.  Patient is here today to discuss further the results> Office Visit on 11/25/2017  Component Date Value Ref Range Status  . Testosterone 11/25/2017 295  250 - 827 ng/dL Final  . Vitamin B-12 11/25/2017 789  200 - 1,100 pg/mL Final  . Total CK 11/25/2017 192  44 - 196 U/L Final  . TSH 11/25/2017 2.76  0.40 - 4.50 mIU/L Final  . Glucose, Bld 11/25/2017 454* 65 - 99 mg/dL Final   Comment: Verified by repeat analysis. Marland Kitchen .            Fasting reference interval . For someone without known diabetes, a glucose value >125 mg/dL indicates that they may have diabetes and this should be confirmed with a follow-up test. .   . BUN 11/25/2017 22  7 - 25 mg/dL Final  . Creat 11/25/2017 1.31* 0.70 - 1.18 mg/dL Final   Comment: For patients >107 years of age, the reference limit for Creatinine is approximately 13% higher for people identified as African-American. .   . GFR, Est Non African American 11/25/2017 53* > OR = 60 mL/min/1.92m Final  . GFR, Est African American 11/25/2017 61  > OR = 60 mL/min/1.740mFinal  . BUN/Creatinine Ratio 11/25/2017 17  6 - 22 (calc) Final  . Sodium 11/25/2017 135  135 - 146 mmol/L Final  . Potassium 11/25/2017 4.8  3.5 - 5.3 mmol/L Final  . Chloride 11/25/2017 102  98 - 110 mmol/L Final  . CO2 11/25/2017 29  20 - 32 mmol/L Final  . Calcium 11/25/2017 8.2* 8.6 - 10.3 mg/dL Final  . Total Protein 11/25/2017 5.7* 6.1 - 8.1 g/dL  Final  . Albumin 11/25/2017 3.4* 3.6 - 5.1 g/dL Final  . Globulin 11/25/2017 2.3  1.9 - 3.7 g/dL (calc) Final  . AG Ratio 11/25/2017 1.5  1.0 - 2.5 (calc) Final  . Total Bilirubin 11/25/2017 0.5  0.2 - 1.2 mg/dL Final  . Alkaline phosphatase (APISO) 11/25/2017 172* 40 - 115 U/L Final  . AST 11/25/2017 46* 10 - 35 U/L Final  . ALT 11/25/2017 65* 9 - 46 U/L Final  . Sed Rate 11/25/2017 2  0 - 20 mm/h Final   Past Medical History:  Diagnosis Date  . Arthritis    lower back  . Chronic back pain    Disc disease  . Chronic kidney disease   . Coronary atherosclerosis of native coronary artery    Coronary calcifications by chest CT, Myoview demonstrating inferolateral scar  . Deafness in right ear   . Diabetes mellitus   . Diabetic retinopathy ([place]  . Diastolic dysfunction 12/07/979 Grade 1.  . Essential hypertension, benign   . Heart murmur    in past  . HOH (hard of hearing)   . Hyperkalemia   . Kidney stones   . Mixed hyperlipidemia   . Pancreatic insufficiency    Diagnosed at [facility]. Prostate  cancer ([place]) 2011  . Shortness of breath dyspnea    occasional - liver pressing on right lung, decreased capacity  . Subdural hematoma ([place])   . Type 2 diabetes mellitus ([place])   . Wears hearing aid    "crossover" form right to left   Past Surgical History:  Procedure Laterality Date  . APPENDECTOMY    . Bilateral hip replacement    . CATARACT EXTRACTION W/PHACO Left 05/15/2015   Procedure: CATARACT EXTRACTION PHACO AND INTRAOCULAR LENS PLACEMENT (IOC);  Surgeon: Ronnell Freshwater, MD;  Location: [place];  Service: Ophthalmology;  Laterality: Left;  DIABETIC - insulin pump and oral meds  . CATARACT EXTRACTION W/PHACO Right 08/28/2015   Procedure: CATARACT EXTRACTION PHACO AND INTRAOCULAR LENS PLACEMENT (IOC);  Surgeon: Ronnell Freshwater, MD;  Location: [place];  Service: Ophthalmology;  Laterality: Right;  DIABETIC PER PT AND CINDY PLEASE KEEP  ARRIVAL TIME AFTER 8AM   . CHOLECYSTECTOMY    . HERNIA REPAIR    . PROSTATECTOMY  2011   Current Outpatient Medications on File Prior to Visit  Medication Sig Dispense Refill  . aspirin EC 81 MG tablet Take 81 mg by mouth at bedtime.     . carvedilol (COREG) 25 MG tablet Take 1 tablet (25 mg total) by mouth 2 (two) times daily. 180 tablet 3  . citalopram (CELEXA) 20 MG tablet Take 1 tablet (20 mg total) by mouth daily. 90 tablet 4  . diazepam (VALIUM) 5 MG tablet TAKE 1 TABLET BY MOUTH TWICE DAILY AS NEEDED FOR ANXIETY 60 tablet 1  . FREESTYLE LITE test strip USE 1 STRIP TO TEST BLOOD SUGAR THREE TIMES DAILY 350 each 2  . furosemide (LASIX) 20 MG tablet TAKE 1 TABLET(20 MG) BY MOUTH DAILY 90 tablet 3  . gabapentin (NEURONTIN) 300 MG capsule Take 2 capsules (600 mg total) by mouth at bedtime. 180 capsule 3  . HYDROcodone-acetaminophen (NORCO) 7.5-325 MG tablet Take 1 tablet by mouth every 6 (six) hours as needed for moderate pain (Chronic pain Dx: G89.4). 60 tablet 0  . insulin NPH Human (HUMULIN N,NOVOLIN N) 100 UNIT/ML injection 12u qam - 8 u qhs    . insulin regular (NOVOLIN R,HUMULIN R) 100 units/mL injection 10 u qam, 5-6 units at lunch and 8-10 u at dinner    . losartan (COZAAR) 100 MG tablet Take by mouth.    . metFORMIN (GLUCOPHAGE-XR) 500 MG 24 hr tablet Take by mouth. 1 tab po qam & 2 po qpm    . Multiple Vitamin (MULTIVITAMIN WITH MINERALS) TABS Take 1 tablet by mouth daily.    Marland Kitchen ZENPEP 10000-32000 units CPEP TAKE 1 CAPSULE BY MOUTH THREE TIMES DAILY BEFORE MEALS 180 capsule 3  . pravastatin (PRAVACHOL) 10 MG tablet Take 1 tablet (10 mg total) by mouth daily with breakfast. (Patient not taking: Reported on 12/09/2017) 90 tablet 3   No current facility-administered medications on file prior to visit.    Allergies  Allergen Reactions  . Enalapril Maleate Cough   Social History   Socioeconomic History  . Marital status: Married    Spouse name: Not on file  . Number of children:  Not on file  . Years of education: Not on file  . Highest education level: Not on file  Occupational History  . Not on file  Social Needs  . Financial resource strain: Not on file  . Food insecurity:    Worry: Not on file    Inability: Not on file  .  Transportation needs:    Medical: Not on file    Non-medical: Not on file  Tobacco Use  . Smoking status: Never Smoker  . Smokeless tobacco: Never Used  Substance and Sexual Activity  . Alcohol use: No  . Drug use: No  . Sexual activity: Not on file  Lifestyle  . Physical activity:    Days per week: Not on file    Minutes per session: Not on file  . Stress: Not on file  Relationships  . Social connections:    Talks on phone: Not on file    Gets together: Not on file    Attends religious service: Not on file    Active member of club or organization: Not on file    Attends meetings of clubs or organizations: Not on file    Relationship status: Not on file  . Intimate partner violence:    Fear of current or ex partner: Not on file    Emotionally abused: Not on file    Physically abused: Not on file    Forced sexual activity: Not on file  Other Topics Concern  . Not on file  Social History Narrative  . Not on file    Review of Systems  All other systems reviewed and are negative.      Objective:   Physical Exam  Cardiovascular: Normal rate, regular rhythm and normal heart sounds.  Pulmonary/Chest: Effort normal and breath sounds normal.  Vitals reviewed.      Assessment:     NASH (nonalcoholic steatohepatitis) - Plan: oxyCODONE (ROXICODONE) 5 MG immediate release tablet, COMPLETE METABOLIC PANEL WITH GFR      Plan:     Very minimal exam was performed today.  Instead more than 20 minutes were spent in discussion regarding his medical condition.  I believe his liver tests are elevated due to fatty liver disease.  Patient does not drink alcohol.  He has held his statin and his Tylenol and would like to recheck  his CMP however based on the ultrasound findings I believe this is more of a chronic condition related to his diet and lifestyle.  I have recommended 15 to 20 pounds of weight loss, 30 minutes a day 5 days a week of aerobic exercise, and extensive dietary changes including eating a low saturated fat diet.  I want him to try to avoid meat, fried food, junk food is much as possible.  I want him to try to eat more fresh fruits and vegetables.  I believe that if he makes these lifestyle changes, and we recheck his liver tests in 6 months, it will be better.

## 2017-12-10 LAB — COMPLETE METABOLIC PANEL WITH GFR
AG RATIO: 1.4 (calc) (ref 1.0–2.5)
ALKALINE PHOSPHATASE (APISO): 177 U/L — AB (ref 40–115)
ALT: 47 U/L — AB (ref 9–46)
AST: 38 U/L — AB (ref 10–35)
Albumin: 3.5 g/dL — ABNORMAL LOW (ref 3.6–5.1)
BUN/Creatinine Ratio: 21 (calc) (ref 6–22)
BUN: 27 mg/dL — ABNORMAL HIGH (ref 7–25)
CO2: 23 mmol/L (ref 20–32)
Calcium: 8.2 mg/dL — ABNORMAL LOW (ref 8.6–10.3)
Chloride: 108 mmol/L (ref 98–110)
Creat: 1.27 mg/dL — ABNORMAL HIGH (ref 0.70–1.18)
GFR, Est African American: 63 mL/min/{1.73_m2} (ref >=60)
GFR, Est Non African American: 55 mL/min/{1.73_m2} — ABNORMAL LOW (ref >=60)
GLOBULIN: 2.5 g/dL (ref 1.9–3.7)
Glucose, Bld: 210 mg/dL — ABNORMAL HIGH (ref 65–99)
POTASSIUM: 4.2 mmol/L (ref 3.5–5.3)
SODIUM: 140 mmol/L (ref 135–146)
Total Bilirubin: 0.5 mg/dL (ref 0.2–1.2)
Total Protein: 6 g/dL — ABNORMAL LOW (ref 6.1–8.1)

## 2017-12-10 LAB — EXTRA LAV TOP TUBE

## 2017-12-11 ENCOUNTER — Encounter (INDEPENDENT_AMBULATORY_CARE_PROVIDER_SITE_OTHER): Payer: Self-pay

## 2017-12-12 ENCOUNTER — Encounter: Payer: Self-pay | Admitting: Podiatry

## 2017-12-12 ENCOUNTER — Ambulatory Visit: Payer: PPO | Admitting: Podiatry

## 2017-12-12 DIAGNOSIS — B351 Tinea unguium: Secondary | ICD-10-CM

## 2017-12-12 DIAGNOSIS — M79676 Pain in unspecified toe(s): Secondary | ICD-10-CM | POA: Diagnosis not present

## 2017-12-15 NOTE — Progress Notes (Signed)
Subjective:   Patient ID: Mark Jumbo Sr., male   DOB: 77 y.o.   MRN: [id number]   HPI Patient presents with nail disease 1-5 both feet that he cannot cut and they become thick and can become bothersome   ROS      Objective:  Physical Exam  Neurovascular status intact with thick yellow brittle nailbeds 1-5 both feet that are incurvated to the quarters at the shoulder with palpation     Assessment:  Mycotic nail infection with pain 1-5 both feet     Plan:  Debride painful nailbeds 1-5 both feet with no iatrogenic bleeding noted

## 2017-12-16 ENCOUNTER — Encounter: Payer: Self-pay | Admitting: Family Medicine

## 2017-12-18 ENCOUNTER — Encounter: Payer: Self-pay | Admitting: Family Medicine

## 2017-12-18 ENCOUNTER — Ambulatory Visit (INDEPENDENT_AMBULATORY_CARE_PROVIDER_SITE_OTHER): Payer: PPO | Admitting: Family Medicine

## 2017-12-18 VITALS — BP 170/84 | HR 76 | Temp 98.3°F | Resp 16 | Ht 69.0 in | Wt 208.0 lb

## 2017-12-18 DIAGNOSIS — I872 Venous insufficiency (chronic) (peripheral): Secondary | ICD-10-CM | POA: Diagnosis not present

## 2017-12-18 DIAGNOSIS — R3915 Urgency of urination: Secondary | ICD-10-CM

## 2017-12-18 LAB — URINALYSIS, ROUTINE W REFLEX MICROSCOPIC
BACTERIA UA: NONE SEEN /HPF
BILIRUBIN URINE: NEGATIVE
HYALINE CAST: NONE SEEN /LPF
Ketones, ur: NEGATIVE
Leukocytes, UA: NEGATIVE
Nitrite: NEGATIVE
SQUAMOUS EPITHELIAL / LPF: NONE SEEN /HPF (ref <=5)
Specific Gravity, Urine: 1.015 (ref 1.001–1.03)
WBC, UA: NONE SEEN /HPF (ref 0–5)
pH: 5 (ref 5.0–8.0)

## 2017-12-18 LAB — MICROSCOPIC MESSAGE

## 2017-12-18 MED ORDER — MOMETASONE FUROATE 0.1 % EX OINT: TOPICAL_OINTMENT | Freq: Every day | CUTANEOUS | 1 refills | Status: DC

## 2017-12-18 NOTE — Progress Notes (Signed)
Subjective:    Patient ID: Mark Hose., male    DOB: [birth year], 77 y.o.   MRN: [id number]  HPI  Patient presents with an itchy rash on the lower medial right shin.  The rash is roughly the diameter of a baseball.  It is erythematous.  It is not warm to the touch.  There is no swelling or tenderness or pain to the touch.  The area itches.  The patch is essentially a plaque of coalesced papules with excoriation.  The surrounding skin is bronze in color with signs of Schamberg's disease suggesting underlying chronic venous stasis disease.  Patient also reports feeling like he has to urinate.  However when he goes to the bathroom he does not need to urinate.  This started this morning.  Urinalysis is unremarkable.  He does not have a prostate given his history of prostate cancer status post prostatectomy. Past Medical History:  Diagnosis Date  . Arthritis    lower back  . Chronic back pain    Disc disease  . Chronic kidney disease   . Coronary atherosclerosis of native coronary artery    Coronary calcifications by chest CT, Myoview demonstrating inferolateral scar  . Deafness in right ear   . Diabetes mellitus   . Diabetic retinopathy ([employer])   . Diastolic dysfunction 09/945   Grade 1.  . Essential hypertension, benign   . Heart murmur    in past  . HOH (hard of hearing)   . Hyperkalemia   . Kidney stones   . Mixed hyperlipidemia   . NAFLD (nonalcoholic fatty liver disease)   . Pancreatic insufficiency    Diagnosed at [facility]  . Prostate cancer ([place]) 2011  . Shortness of breath dyspnea    occasional - liver pressing on right lung, decreased capacity  . Subdural hematoma ([place])   . Type 2 diabetes mellitus ([place])   . Wears hearing aid    "crossover" form right to left   Past Surgical History:  Procedure Laterality Date  . APPENDECTOMY    . Bilateral hip replacement    . CATARACT EXTRACTION W/PHACO Left 05/15/2015   Procedure: CATARACT EXTRACTION PHACO AND INTRAOCULAR  LENS PLACEMENT (IOC);  Surgeon: Ronnell Freshwater, MD;  Location: [place];  Service: Ophthalmology;  Laterality: Left;  DIABETIC - insulin pump and oral meds  . CATARACT EXTRACTION W/PHACO Right 08/28/2015   Procedure: CATARACT EXTRACTION PHACO AND INTRAOCULAR LENS PLACEMENT (IOC);  Surgeon: Ronnell Freshwater, MD;  Location: [place];  Service: Ophthalmology;  Laterality: Right;  DIABETIC PER PT AND CINDY PLEASE KEEP ARRIVAL TIME AFTER 8AM   . CHOLECYSTECTOMY    . HERNIA REPAIR    . PROSTATECTOMY  2011   Current Outpatient Medications on File Prior to Visit  Medication Sig Dispense Refill  . aspirin EC 81 MG tablet Take 81 mg by mouth at bedtime.     . carvedilol (COREG) 25 MG tablet Take 1 tablet (25 mg total) by mouth 2 (two) times daily. 180 tablet 3  . citalopram (CELEXA) 20 MG tablet Take 1 tablet (20 mg total) by mouth daily. 90 tablet 4  . diazepam (VALIUM) 5 MG tablet TAKE 1 TABLET BY MOUTH TWICE DAILY AS NEEDED FOR ANXIETY 60 tablet 1  . doxazosin (CARDURA) 8 MG tablet TAKE 1 TABLET(8 MG) BY MOUTH DAILY 90 tablet 0  . FREESTYLE LITE test strip USE 1 STRIP TO TEST BLOOD SUGAR THREE TIMES DAILY 350 each 2  . furosemide (  LASIX) 20 MG tablet TAKE 1 TABLET(20 MG) BY MOUTH DAILY 90 tablet 3  . gabapentin (NEURONTIN) 300 MG capsule Take 2 capsules (600 mg total) by mouth at bedtime. 180 capsule 3  . HYDROcodone-acetaminophen (NORCO) 7.5-325 MG tablet Take 1 tablet by mouth every 6 (six) hours as needed for moderate pain (Chronic pain Dx: G89.4). 60 tablet 0  . insulin NPH Human (HUMULIN N,NOVOLIN N) 100 UNIT/ML injection 12u qam - 8 u qhs    . insulin regular (NOVOLIN R,HUMULIN R) 100 units/mL injection 10 u qam, 5-6 units at lunch and 8-10 u at dinner    . losartan (COZAAR) 100 MG tablet Take by mouth.    . metFORMIN (GLUCOPHAGE-XR) 500 MG 24 hr tablet Take by mouth. 1 tab po qam & 2 po qpm    . Multiple Vitamin (MULTIVITAMIN WITH MINERALS) TABS Take 1  tablet by mouth daily.    Marland Kitchen oxyCODONE (ROXICODONE) 5 MG immediate release tablet Take 1 tablet (5 mg total) by mouth every 4 (four) hours as needed for severe pain. Discontinue hydrocodone 60 tablet 0  . pravastatin (PRAVACHOL) 10 MG tablet Take 1 tablet (10 mg total) by mouth daily with breakfast. 90 tablet 3  . ZENPEP 10000-32000 units CPEP TAKE 1 CAPSULE BY MOUTH THREE TIMES DAILY BEFORE MEALS 180 capsule 3   No current facility-administered medications on file prior to visit.    Allergies  Allergen Reactions  . Enalapril Maleate Cough   Social History   Socioeconomic History  . Marital status: Married    Spouse name: Not on file  . Number of children: Not on file  . Years of education: Not on file  . Highest education level: Not on file  Occupational History  . Not on file  Social Needs  . Financial resource strain: Not on file  . Food insecurity:    Worry: Not on file    Inability: Not on file  . Transportation needs:    Medical: Not on file    Non-medical: Not on file  Tobacco Use  . Smoking status: Never Smoker  . Smokeless tobacco: Never Used  Substance and Sexual Activity  . Alcohol use: No  . Drug use: No  . Sexual activity: Not on file  Lifestyle  . Physical activity:    Days per week: Not on file    Minutes per session: Not on file  . Stress: Not on file  Relationships  . Social connections:    Talks on phone: Not on file    Gets together: Not on file    Attends religious service: Not on file    Active member of club or organization: Not on file    Attends meetings of clubs or organizations: Not on file    Relationship status: Not on file  . Intimate partner violence:    Fear of current or ex partner: Not on file    Emotionally abused: Not on file    Physically abused: Not on file    Forced sexual activity: Not on file  Other Topics Concern  . Not on file  Social History Narrative  . Not on file     Review of Systems  All other systems  reviewed and are negative.      Objective:   Physical Exam  Constitutional: He appears well-developed and well-nourished.  HENT:  Head: Normocephalic and atraumatic.  Cardiovascular: Normal rate and regular rhythm.  Pulmonary/Chest: Effort normal and breath sounds normal. No stridor. No respiratory  distress. He has no wheezes. He has no rales.  Musculoskeletal:       Legs: Skin: Rash noted. There is erythema.  Vitals reviewed.         Assessment & Plan:  Urinary urgency - Plan: Urinalysis, Routine w reflex microscopic  Venous stasis dermatitis of right lower extremity  I believe the patient has an exacerbation of chronic venous stasis disease and has chronic venous stasis dermatitis.  I recommended Elocon ointment be applied daily to the affected area for 7 to 10 days and I anticipate gradual improvement over the next 7 to 10 days.  I see no evidence of secondary cellulitis at present.  Once better I have recommended that he wear knee-high compression hose every day to help prevent this from occurring in the future.  Monitor for signs of secondary cellulitis.  Urinalysis is normal.  Therefore I recommended that we simply monitor his urinary urgency.  This is very mild and just started this morning

## 2017-12-19 ENCOUNTER — Ambulatory Visit: Payer: PPO | Admitting: Family Medicine

## 2018-01-06 ENCOUNTER — Other Ambulatory Visit: Payer: Self-pay | Admitting: Family Medicine

## 2018-01-06 ENCOUNTER — Encounter: Payer: Self-pay | Admitting: Family Medicine

## 2018-01-06 DIAGNOSIS — K7581 Nonalcoholic steatohepatitis (NASH): Secondary | ICD-10-CM

## 2018-01-06 MED ORDER — OXYCODONE HCL 5 MG PO TABS: 5.0000 mg | ORAL_TABLET | ORAL | 0 refills | Status: DC | PRN

## 2018-01-06 NOTE — Telephone Encounter (Signed)
Patient requesting a refill on Oxycodone     LOV: 12/18/17  LRF:  12/09/17

## 2018-01-13 DIAGNOSIS — E113512 Type 2 diabetes mellitus with proliferative diabetic retinopathy with macular edema, left eye: Secondary | ICD-10-CM | POA: Diagnosis not present

## 2018-01-15 ENCOUNTER — Other Ambulatory Visit: Payer: Self-pay | Admitting: Family Medicine

## 2018-01-19 ENCOUNTER — Other Ambulatory Visit: Payer: Self-pay | Admitting: Family Medicine

## 2018-01-20 DIAGNOSIS — E1142 Type 2 diabetes mellitus with diabetic polyneuropathy: Secondary | ICD-10-CM | POA: Diagnosis not present

## 2018-01-28 DIAGNOSIS — E1159 Type 2 diabetes mellitus with other circulatory complications: Secondary | ICD-10-CM | POA: Diagnosis not present

## 2018-01-28 DIAGNOSIS — E1142 Type 2 diabetes mellitus with diabetic polyneuropathy: Secondary | ICD-10-CM | POA: Diagnosis not present

## 2018-01-28 DIAGNOSIS — E113293 Type 2 diabetes mellitus with mild nonproliferative diabetic retinopathy without macular edema, bilateral: Secondary | ICD-10-CM | POA: Diagnosis not present

## 2018-01-28 DIAGNOSIS — Z794 Long term (current) use of insulin: Secondary | ICD-10-CM | POA: Diagnosis not present

## 2018-01-28 DIAGNOSIS — I10 Essential (primary) hypertension: Secondary | ICD-10-CM | POA: Diagnosis not present

## 2018-02-02 ENCOUNTER — Encounter: Payer: Self-pay | Admitting: Family Medicine

## 2018-02-02 DIAGNOSIS — K7581 Nonalcoholic steatohepatitis (NASH): Secondary | ICD-10-CM

## 2018-02-02 NOTE — Telephone Encounter (Signed)
Patient requesting a refill on Oxycodone     LOV:  12/18/17  LRF: 01/06/18

## 2018-02-03 ENCOUNTER — Other Ambulatory Visit: Payer: Self-pay | Admitting: Family Medicine

## 2018-02-03 MED ORDER — OXYCODONE HCL 5 MG PO TABS: 5.0000 mg | ORAL_TABLET | ORAL | 0 refills | Status: DC | PRN

## 2018-02-03 NOTE — Telephone Encounter (Signed)
Requesting refill    valium  LOV: 12/18/17  LRF:  09/04/17

## 2018-03-04 ENCOUNTER — Other Ambulatory Visit: Payer: Self-pay | Admitting: Family Medicine

## 2018-03-04 ENCOUNTER — Encounter: Payer: Self-pay | Admitting: Family Medicine

## 2018-03-04 DIAGNOSIS — K7581 Nonalcoholic steatohepatitis (NASH): Secondary | ICD-10-CM

## 2018-03-04 NOTE — Telephone Encounter (Signed)
Ok to refill oxycodone??  Last office visit 12/18/2017.  Last refill 02/03/2018.

## 2018-03-05 MED ORDER — OXYCODONE HCL 5 MG PO TABS: 5.0000 mg | ORAL_TABLET | ORAL | 0 refills | Status: DC | PRN

## 2018-03-10 ENCOUNTER — Ambulatory Visit (INDEPENDENT_AMBULATORY_CARE_PROVIDER_SITE_OTHER): Payer: PPO | Admitting: Family Medicine

## 2018-03-10 ENCOUNTER — Encounter: Payer: Self-pay | Admitting: Family Medicine

## 2018-03-10 VITALS — BP 176/88 | HR 86 | Temp 98.0°F | Resp 18 | Ht 69.0 in | Wt 205.0 lb

## 2018-03-10 DIAGNOSIS — K6289 Other specified diseases of anus and rectum: Secondary | ICD-10-CM | POA: Diagnosis not present

## 2018-03-10 MED ORDER — SWEEN EX CREA: TOPICAL_CREAM | Freq: Two times a day (BID) | CUTANEOUS | 0 refills | Status: DC
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
## 2018-03-12 ENCOUNTER — Encounter: Payer: Self-pay | Admitting: Family Medicine

## 2018-03-13 ENCOUNTER — Ambulatory Visit: Payer: PPO | Admitting: Podiatry

## 2018-03-13 ENCOUNTER — Encounter: Payer: Self-pay | Admitting: Podiatry

## 2018-03-13 DIAGNOSIS — B351 Tinea unguium: Secondary | ICD-10-CM | POA: Diagnosis not present

## 2018-03-13 DIAGNOSIS — M79676 Pain in unspecified toe(s): Secondary | ICD-10-CM | POA: Diagnosis not present

## 2018-03-16 NOTE — Progress Notes (Signed)
Subjective:   Patient ID: Mark Jumbo Sr., male   DOB: 77 y.o.   MRN: [id number]   HPI Patient presents with nail disease 1-5 both feet that he cannot take care of and they become painful when they get long   ROS      Objective:  Physical Exam  Thick yellow brittle nailbeds 1-5 both feet that are incurvated in the corners thickened in the center and painful with palpation     Assessment:  Mycotic nail infection 1-5 both feet     Plan:  Debridement of nailbeds 1-5 both feet with no iatrogenic bleeding noted

## 2018-03-20 ENCOUNTER — Encounter: Payer: Self-pay | Admitting: Family Medicine

## 2018-03-22 ENCOUNTER — Other Ambulatory Visit: Payer: Self-pay | Admitting: Family Medicine

## 2018-03-23 MED ORDER — AMLODIPINE BESYLATE 10 MG PO TABS: 10.0000 mg | ORAL_TABLET | Freq: Every day | ORAL | 3 refills | Status: DC

## 2018-04-01 ENCOUNTER — Encounter: Payer: Self-pay | Admitting: Family Medicine

## 2018-04-01 DIAGNOSIS — K7581 Nonalcoholic steatohepatitis (NASH): Secondary | ICD-10-CM

## 2018-04-02 NOTE — Telephone Encounter (Signed)
Patient requesting a refill on Oxycodone     LOV: 03/10/18  LRF:  03/05/18

## 2018-04-03 MED ORDER — OXYCODONE HCL 5 MG PO TABS: 5.0000 mg | ORAL_TABLET | ORAL | 0 refills | Status: DC | PRN

## 2018-04-03 NOTE — Addendum Note (Signed)
Addended by: Shary Decamp B on: 04/03/2018 07:57 AM   Modules accepted: Orders

## 2018-04-03 NOTE — Telephone Encounter (Signed)
Transmission failed please resend

## 2018-04-09 ENCOUNTER — Encounter: Payer: Self-pay | Admitting: Family Medicine

## 2018-04-10 MED ORDER — HYDROCHLOROTHIAZIDE 25 MG PO TABS: 25.0000 mg | ORAL_TABLET | Freq: Every day | ORAL | 3 refills | Status: DC

## 2018-04-11 ENCOUNTER — Other Ambulatory Visit: Payer: Self-pay | Admitting: Family Medicine

## 2018-04-23 ENCOUNTER — Encounter: Payer: Self-pay | Admitting: Family Medicine

## 2018-04-23 ENCOUNTER — Ambulatory Visit (INDEPENDENT_AMBULATORY_CARE_PROVIDER_SITE_OTHER): Payer: PPO | Admitting: Family Medicine

## 2018-04-23 VITALS — BP 124/80 | HR 76 | Temp 98.1°F | Resp 20 | Ht 69.0 in | Wt 208.0 lb

## 2018-04-23 DIAGNOSIS — L989 Disorder of the skin and subcutaneous tissue, unspecified: Secondary | ICD-10-CM | POA: Diagnosis not present

## 2018-04-23 DIAGNOSIS — K760 Fatty (change of) liver, not elsewhere classified: Secondary | ICD-10-CM

## 2018-04-23 DIAGNOSIS — Z23 Encounter for immunization: Secondary | ICD-10-CM

## 2018-04-23 DIAGNOSIS — E782 Mixed hyperlipidemia: Secondary | ICD-10-CM

## 2018-04-23 DIAGNOSIS — I2510 Atherosclerotic heart disease of native coronary artery without angina pectoris: Secondary | ICD-10-CM

## 2018-04-23 DIAGNOSIS — I10 Essential (primary) hypertension: Secondary | ICD-10-CM | POA: Diagnosis not present

## 2018-04-23 DIAGNOSIS — Z8546 Personal history of malignant neoplasm of prostate: Secondary | ICD-10-CM

## 2018-04-23 MED ORDER — TRIAMCINOLONE ACETONIDE 0.1 % EX CREA: 1.0000 "application " | TOPICAL_CREAM | Freq: Two times a day (BID) | CUTANEOUS | 0 refills | Status: DC

## 2018-04-23 NOTE — Addendum Note (Signed)
Addended by: Shary Decamp B on: 04/23/2018 03:16 PM   Modules accepted: Orders

## 2018-04-23 NOTE — Progress Notes (Signed)
Subjective:     Patient ID: Mark Hose., male   DOB: Oct 02, 1940, 77 y.o.   MRN: [id number]  HPI 12/09/17 At a recent office visit, the patient was found to have mild elevations of his AST and ALT.  At that point, we recommended that he temporarily discontinue his pravastatin and try to avoid taking his pain medication which has Tylenol in it and then return in 2 weeks to recheck his CMP.  However we also obtain a right upper quadrant ultrasound.  Ultrasound revealed diffuse increased liver echogenicity consistent with hepatic steatosis.  Patient is here today to discuss further the results.  At that time, my plan was: Very minimal exam was performed today.  Instead more than 20 minutes were spent in discussion regarding his medical condition.  I believe his liver tests are elevated due to fatty liver disease.  Patient does not drink alcohol.  He has held his statin and his Tylenol and would like to recheck his CMP however based on the ultrasound findings I believe this is more of a chronic condition related to his diet and lifestyle.  I have recommended 15 to 20 pounds of weight loss, 30 minutes a day 5 days a week of aerobic exercise, and extensive dietary changes including eating a low saturated fat diet.  I want him to try to avoid meat, fried food, junk food is much as possible.  I want him to try to eat more fresh fruits and vegetables.  I believe that if he makes these lifestyle changes, and we recheck his liver tests in 6 months, it will be better.  04/23/18 Recently we added hydrochlorothiazide 25 mg a day and discontinued Lasix due to persistently elevated blood pressures.  The patient's blood pressure at home has been between 130 and 145/60-80 area he has had occasional isolated episodes where his systolic blood pressure was 150 but these are few and far between.  I checked his blood pressure today and found it to be 138/60.  He denies any chest pain shortness of breath or dyspnea on  exertion.  In May, I temporarily discontinued his pravastatin due to his elevated liver function test.  He never restarted that.  Past medical history significant for coronary artery disease.  I recommended today that the patient resume his pravastatin for secondary prevention of coronary artery disease and allow me to recheck his fasting lipid panel to determine management of his hyperlipidemia.  Past medical history is also significant for history of prostate cancer.  His last PSA was less than 0.01 in July of last year.  He is due to recheck his PSA today.  He also has a suspicious lesion on the dorsum of his right hand.  It is 1/2 cm in diameter.  It is a hyperkeratotic white scaly papule with an erythematous base concerning for possible malignancy such as squamous cell. Past Medical History:  Diagnosis Date  . Arthritis    lower back  . Chronic back pain    Disc disease  . Chronic kidney disease   . Coronary atherosclerosis of native coronary artery    Coronary calcifications by chest CT, Myoview demonstrating inferolateral scar  . Deafness in right ear   . Diabetes mellitus   . Diabetic retinopathy ([place])   . Diastolic dysfunction 11/4032   Grade 1.  . Essential hypertension, benign   . Heart murmur    in past  . HOH (hard of hearing)   . Hyperkalemia   . Kidney  stones   . Mixed hyperlipidemia   . NAFLD (nonalcoholic fatty liver disease)   . Pancreatic insufficiency    Diagnosed at [facility]  . Prostate cancer ([place]) 2011  . Shortness of breath dyspnea    occasional - liver pressing on right lung, decreased capacity  . Subdural hematoma ([name])   . Type 2 diabetes mellitus ([place])   . Wears hearing aid    "crossover" form right to left   Past Surgical History:  Procedure Laterality Date  . APPENDECTOMY    . Bilateral hip replacement    . CATARACT EXTRACTION W/PHACO Left 05/15/2015   Procedure: CATARACT EXTRACTION PHACO AND INTRAOCULAR LENS PLACEMENT (IOC);  Surgeon: Ronnell Freshwater, MD;  Location: [place];  Service: Ophthalmology;  Laterality: Left;  DIABETIC - insulin pump and oral meds  . CATARACT EXTRACTION W/PHACO Right 08/28/2015   Procedure: CATARACT EXTRACTION PHACO AND INTRAOCULAR LENS PLACEMENT (IOC);  Surgeon: Ronnell Freshwater, MD;  Location: [facility];  Service: Ophthalmology;  Laterality: Right;  DIABETIC PER PT AND CINDY PLEASE KEEP ARRIVAL TIME AFTER 8AM   . CHOLECYSTECTOMY    . HERNIA REPAIR    . PROSTATECTOMY  2011   Current Outpatient Medications on File Prior to Visit  Medication Sig Dispense Refill  . amLODipine (NORVASC) 10 MG tablet Take 1 tablet (10 mg total) by mouth daily. 90 tablet 3  . aspirin EC 81 MG tablet Take 81 mg by mouth at bedtime.     . carvedilol (COREG) 25 MG tablet TAKE 1 TABLET(25 MG) BY MOUTH TWICE DAILY 180 tablet 3  . citalopram (CELEXA) 20 MG tablet TAKE 1 TABLET(20 MG) BY MOUTH DAILY 90 tablet 3  . diazepam (VALIUM) 5 MG tablet TAKE 1 TABLET BY MOUTH TWICE DAILY AS NEEDED FOR ANXIETY 60 tablet 0  . doxazosin (CARDURA) 8 MG tablet TAKE 1 TABLET(8 MG) BY MOUTH DAILY 90 tablet 0  . FREESTYLE LITE test strip USE 1 STRIP TO TEST BLOOD SUGAR THREE TIMES DAILY 350 each 2  . gabapentin (NEURONTIN) 300 MG capsule TAKE 2 CAPSULES(600 MG) BY MOUTH AT BEDTIME 180 capsule 0  . hydrochlorothiazide (HYDRODIURIL) 25 MG tablet Take 1 tablet (25 mg total) by mouth daily. 90 tablet 3  . insulin NPH Human (HUMULIN N,NOVOLIN N) 100 UNIT/ML injection 12u qam - 8 u qhs    . insulin regular (NOVOLIN R,HUMULIN R) 100 units/mL injection 10 u qam, 5-6 units at lunch and 8-10 u at dinner    . losartan (COZAAR) 100 MG tablet Take by mouth.    . metFORMIN (GLUCOPHAGE-XR) 500 MG 24 hr tablet Take by mouth. 1 tab po qam & 2 po qpm    . mometasone (ELOCON) 0.1 % ointment Apply topically daily. 45 g 1  . Multiple Vitamin (MULTIVITAMIN WITH MINERALS) TABS Take 1 tablet by mouth daily.    Marland Kitchen oxyCODONE (ROXICODONE) 5 MG  immediate release tablet Take 1 tablet (5 mg total) by mouth every 4 (four) hours as needed for severe pain. 60 tablet 0  . pravastatin (PRAVACHOL) 10 MG tablet TAKE 1 TABLET(10 MG) BY MOUTH DAILY WITH BREAKFAST 90 tablet 0  . Vitamins A & D (VITAMIN A & D) cream Apply topically 2 (two) times daily. 14 g 0  . ZENPEP 10000-32000 units CPEP TAKE 1 CAPSULE BY MOUTH THREE TIMES DAILY BEFORE MEALS 180 capsule 3   No current facility-administered medications on file prior to visit.    Allergies  Allergen Reactions  . Enalapril  Maleate Cough   Social History   Socioeconomic History  . Marital status: Married    Spouse name: Not on file  . Number of children: Not on file  . Years of education: Not on file  . Highest education level: Not on file  Occupational History  . Not on file  Social Needs  . Financial resource strain: Not on file  . Food insecurity:    Worry: Not on file    Inability: Not on file  . Transportation needs:    Medical: Not on file    Non-medical: Not on file  Tobacco Use  . Smoking status: Never Smoker  . Smokeless tobacco: Never Used  Substance and Sexual Activity  . Alcohol use: No  . Drug use: No  . Sexual activity: Not on file  Lifestyle  . Physical activity:    Days per week: Not on file    Minutes per session: Not on file  . Stress: Not on file  Relationships  . Social connections:    Talks on phone: Not on file    Gets together: Not on file    Attends religious service: Not on file    Active member of club or organization: Not on file    Attends meetings of clubs or organizations: Not on file    Relationship status: Not on file  . Intimate partner violence:    Fear of current or ex partner: Not on file    Emotionally abused: Not on file    Physically abused: Not on file    Forced sexual activity: Not on file  Other Topics Concern  . Not on file  Social History Narrative  . Not on file    Review of Systems  All other systems reviewed and  are negative.      Objective:   Physical Exam  Cardiovascular: Normal rate, regular rhythm, normal heart sounds and intact distal pulses. Exam reveals no gallop and no friction rub.  No murmur heard. Pulmonary/Chest: Effort normal and breath sounds normal. No stridor. No respiratory distress. He has no wheezes. He has no rales. He exhibits no tenderness.  Abdominal: Soft. Bowel sounds are normal. He exhibits no distension. There is no tenderness. There is no guarding.  Musculoskeletal: He exhibits no edema.       Hands: Vitals reviewed.      Assessment:     NAFLD (nonalcoholic fatty liver disease)  Essential hypertension, benign  Atherosclerosis of native coronary artery of native heart, angina presence unspecified  Mixed hyperlipidemia  History of prostate cancer - Plan: PSA  Skin lesion of hand       Plan:     I will repeat a CMP to monitor the patient's fatty liver disease.  Continue to recommend 30 minutes a day 5 days a week of aerobic exercise and a diet rich in fruits and vegetables and low in saturated fat.  Like to see the patient lose more weight.  His blood pressure today is well controlled.  The majority of his home blood pressures are well controlled.  Continue hydrochlorothiazide at his present dose and discontinue Lasix permanently.  Regarding his coronary artery disease I recommended that he resume pravastatin.  I will obtain US fasting lipid panel today.  LDL will likely be greater than 70 given the fact he has been off his statin since May.  I will then recheck a fasting lipid panel after resuming his statin in 3 months.  The lesion on the dorsum  of his right hand was treated with cryotherapy using liquid nitrogen for a total of 30 seconds.  If lesion persist, I would recommend a skin biopsy

## 2018-04-24 LAB — PSA: PSA: <0.1 ng/mL (ref <=4.0)

## 2018-04-30 ENCOUNTER — Encounter: Payer: Self-pay | Admitting: Family Medicine

## 2018-04-30 DIAGNOSIS — K7581 Nonalcoholic steatohepatitis (NASH): Secondary | ICD-10-CM

## 2018-04-30 MED ORDER — OXYCODONE HCL 5 MG PO TABS: 5.0000 mg | ORAL_TABLET | ORAL | 0 refills | Status: DC | PRN

## 2018-04-30 NOTE — Telephone Encounter (Signed)
Patient requesting a refill on Oxycodone     LOV: 04/23/18  LRF:   04/03/18

## 2018-05-04 DIAGNOSIS — E1142 Type 2 diabetes mellitus with diabetic polyneuropathy: Secondary | ICD-10-CM | POA: Diagnosis not present

## 2018-05-11 ENCOUNTER — Other Ambulatory Visit: Payer: Self-pay | Admitting: Family Medicine

## 2018-05-11 DIAGNOSIS — Z794 Long term (current) use of insulin: Secondary | ICD-10-CM | POA: Diagnosis not present

## 2018-05-11 DIAGNOSIS — E1159 Type 2 diabetes mellitus with other circulatory complications: Secondary | ICD-10-CM | POA: Diagnosis not present

## 2018-05-11 DIAGNOSIS — E113293 Type 2 diabetes mellitus with mild nonproliferative diabetic retinopathy without macular edema, bilateral: Secondary | ICD-10-CM | POA: Diagnosis not present

## 2018-05-11 DIAGNOSIS — E1142 Type 2 diabetes mellitus with diabetic polyneuropathy: Secondary | ICD-10-CM | POA: Diagnosis not present

## 2018-05-11 NOTE — Telephone Encounter (Signed)
Ok to refill Diazepam??   Last office visit 04/23/2018.  Last refill 02/03/2018.

## 2018-05-18 DIAGNOSIS — E113513 Type 2 diabetes mellitus with proliferative diabetic retinopathy with macular edema, bilateral: Secondary | ICD-10-CM | POA: Diagnosis not present

## 2018-05-29 ENCOUNTER — Other Ambulatory Visit: Payer: Self-pay | Admitting: Family Medicine

## 2018-06-01 ENCOUNTER — Encounter: Payer: Self-pay | Admitting: Family Medicine

## 2018-06-01 DIAGNOSIS — K7581 Nonalcoholic steatohepatitis (NASH): Secondary | ICD-10-CM

## 2018-06-01 MED ORDER — OXYCODONE HCL 5 MG PO TABS: 5.0000 mg | ORAL_TABLET | ORAL | 0 refills | Status: DC | PRN

## 2018-06-01 NOTE — Telephone Encounter (Signed)
Ok to refill??  Last office visit 04/23/2018.  Last refill 04/30/2018.

## 2018-06-02 ENCOUNTER — Other Ambulatory Visit: Payer: Self-pay | Admitting: *Deleted

## 2018-06-02 ENCOUNTER — Telehealth: Payer: Self-pay | Admitting: Family Medicine

## 2018-06-02 ENCOUNTER — Encounter: Payer: Self-pay | Admitting: Family Medicine

## 2018-06-02 DIAGNOSIS — K7581 Nonalcoholic steatohepatitis (NASH): Secondary | ICD-10-CM

## 2018-06-02 MED ORDER — OXYCODONE HCL 5 MG PO TABS: 5.0000 mg | ORAL_TABLET | ORAL | 0 refills | Status: DC | PRN

## 2018-06-02 NOTE — Telephone Encounter (Signed)
Oxycodone sent to [name] instead of [facility].   Ok to resend?

## 2018-06-02 NOTE — Telephone Encounter (Signed)
pts oxycodone was sent to mail order instead of [facility] [address] please resend.

## 2018-06-02 NOTE — Telephone Encounter (Signed)
Rx has been sent to [facility]

## 2018-06-12 ENCOUNTER — Ambulatory Visit: Payer: PPO | Admitting: Podiatry

## 2018-06-12 DIAGNOSIS — E1151 Type 2 diabetes mellitus with diabetic peripheral angiopathy without gangrene: Secondary | ICD-10-CM | POA: Diagnosis not present

## 2018-06-12 DIAGNOSIS — M79674 Pain in right toe(s): Secondary | ICD-10-CM

## 2018-06-12 DIAGNOSIS — M79675 Pain in left toe(s): Secondary | ICD-10-CM

## 2018-06-12 DIAGNOSIS — B351 Tinea unguium: Secondary | ICD-10-CM

## 2018-06-12 DIAGNOSIS — E1142 Type 2 diabetes mellitus with diabetic polyneuropathy: Secondary | ICD-10-CM | POA: Diagnosis not present

## 2018-06-12 NOTE — Patient Instructions (Signed)

## 2018-06-24 ENCOUNTER — Ambulatory Visit (INDEPENDENT_AMBULATORY_CARE_PROVIDER_SITE_OTHER): Payer: PPO | Admitting: Family Medicine

## 2018-06-24 ENCOUNTER — Encounter: Payer: Self-pay | Admitting: Family Medicine

## 2018-06-24 ENCOUNTER — Other Ambulatory Visit: Payer: Self-pay

## 2018-06-24 VITALS — BP 128/66 | HR 72 | Temp 98.7°F | Resp 18 | Ht 69.0 in | Wt 218.0 lb

## 2018-06-24 DIAGNOSIS — R609 Edema, unspecified: Secondary | ICD-10-CM

## 2018-06-24 DIAGNOSIS — J209 Acute bronchitis, unspecified: Secondary | ICD-10-CM

## 2018-06-24 MED ORDER — AZITHROMYCIN 250 MG PO TABS: ORAL_TABLET | ORAL | 0 refills | Status: DC

## 2018-06-24 MED ORDER — PREDNISONE 20 MG PO TABS: 40.0000 mg | ORAL_TABLET | Freq: Every day | ORAL | 0 refills | Status: DC

## 2018-06-24 MED ORDER — ALBUTEROL SULFATE HFA 108 (90 BASE) MCG/ACT IN AERS: 2.0000 | INHALATION_SPRAY | RESPIRATORY_TRACT | 0 refills | Status: DC | PRN

## 2018-06-24 NOTE — Patient Instructions (Signed)
Take antibiotics Use albuterol  Take prednisone as prescribed Take the lasix for 2-3 days instead of HCTZ to get the extra fluid off  F/U 2 weeks

## 2018-06-24 NOTE — Progress Notes (Signed)
   Subjective:    Patient ID: Mark Hose., male    DOB: [birth year], 77 y.o.   MRN: [id number]  Patient presents for Illness (x3 days- nonproductive cough, wheezing, SOB on exertion- wife has recent dx of bronchitis)   Cough with congestion but he is not getting the sputum up, he has ratteling in chest and wheezing. Gets SOB with exertion.  No fever, no chest pain. No muscle aches  No vomiting or diarrhea Mild sinus drainage Wife has been sick  He has taken Tylenol  Blood sugar has been normal per report 168 last night 80's this morning - FOllows with endocrinology    Review Of Systems:  GEN- +fatigue,denies  fever, weight loss,weakness, recent illness HEENT- denies eye drainage, change in vision, nasal discharge, CVS- denies chest pain, palpitations RESP-  +SOB, +cough, +wheeze ABD- denies N/V, change in stools, abd pain GU- denies dysuria, hematuria, dribbling, incontinence MSK- denies joint pain, muscle aches, injury Neuro- denies headache, dizziness, syncope, seizure activity       Objective:    BP 128/66   Pulse 72   Temp 98.7 F (37.1 C) (Oral)   Resp 18   Ht 5' 9"  (1.753 m)   Wt 218 lb (98.9 kg)   SpO2 96%   BMI 32.19 kg/m  GEN- NAD, alert and oriented x3 HEENT- PERRL, EOMI, non injected sclera, pink conjunctiva, MMM, oropharynx clear, nares clear rhinorrhea, no sinus tenderness, TM clear no effusion Neck- Supple, no JVD CVS- RRR, no murmur RESP-rhonchi bases R >L, scattered wheeze, normal WOB at rest, no retractions ABD-NABS,soft,NT,ND EXT-1+ pitting  Edema Bilat LE Pulses- Radial, DP- 2+        Assessment & Plan:      Problem List Items Addressed This Visit      Unprioritized   Peripheral edema    Pt weighed with shoes and coat, so  weight inaccurate  He does have chronic edema which we discussed, states since bp meds changed ( started [facility]) and some other issues, he has compression hose but does not wear them consistently He has lasix  at home, advised to take 3 days of the lasix 23m in place of the HCTZ,  I think his illness is more of the bronchitis instead of a CHF in general. He has multiple comorbidites,some scarring in lungs more on right side and based on history will cover bronchitis for bacterial superinfection with zpak, prednisone, albuterol.  Recommend f/u in 2 weeks, to recheck his BP, legs, discuss his medications       Other Visit Diagnoses    Acute bronchitis, unspecified organism    -  Primary      Note: This dictation was prepared with Dragon dictation along with smaller phrase technology. Any transcriptional errors that result from this process are unintentional.

## 2018-06-25 ENCOUNTER — Other Ambulatory Visit: Payer: Self-pay | Admitting: Family Medicine

## 2018-06-25 ENCOUNTER — Encounter: Payer: Self-pay | Admitting: Family Medicine

## 2018-06-25 DIAGNOSIS — R609 Edema, unspecified: Secondary | ICD-10-CM | POA: Insufficient documentation

## 2018-06-25 DIAGNOSIS — R600 Localized edema: Secondary | ICD-10-CM | POA: Insufficient documentation

## 2018-06-25 NOTE — Assessment & Plan Note (Addendum)
Pt weighed with shoes and coat, so  weight inaccurate  He does have chronic edema which we discussed, states since bp meds changed ( started [facility]) and some other issues, he has compression hose but does not wear them consistently He has lasix at home, advised to take 3 days of the lasix 6m in place of the HCTZ,  I think his illness is more of the bronchitis instead of a CHF in general. He has multiple comorbidites,some scarring in lungs more on right side and based on history will cover bronchitis for bacterial superinfection with zpak, prednisone, albuterol.  Recommend f/u in 2 weeks, to recheck his BP, legs, discuss his medications

## 2018-06-29 ENCOUNTER — Encounter: Payer: Self-pay | Admitting: Podiatry

## 2018-06-29 ENCOUNTER — Encounter: Payer: Self-pay | Admitting: Family Medicine

## 2018-06-29 DIAGNOSIS — K7581 Nonalcoholic steatohepatitis (NASH): Secondary | ICD-10-CM

## 2018-06-29 NOTE — Progress Notes (Signed)
Subjective: Mark Jumbo Sr. is a 77 y.o. y.o. male who presents for preventative foot care today with diabetes, diabetic neuropathy and cc of painful, discolored, thick toenails which often  interfere with daily activities. Pain is aggravated when wearing enclosed shoe gear and  is relieved with periodic professional debridement.  Objective: Vascular Examination: Capillary refill time immediate x 10 digits Dorsalis pedis pulses absent b/l Posterior tibial pulses 2/4 b/l No digital hair x 10 digits Skin temperature warm to cool b/l Trace edema BLE  Dermatological Examination: Skin thin, shiny and atrophic b/l  Toenails 1-5 b/l discolored, thick, dystrophic with subungual debris and pain with palpation to nailbeds due to thickness of nails.   Musculoskeletal: Muscle strength 5/5 to all LE muscle groups  Neurological: Sensation diminished with 10 gram monofilament. Vibratory sensation diminished  Assessment: 1. Painful onychomycosis toenails 1-5 b/l 2. NIDDM with Peripheral arterial disease 3. Diabetic neuropathy  Plan: 1. Continue diabetic foot care principles. Literature dispensed. 2. Toenails 1-5 b/l were debrided in length and girth without iatrogenic bleeding. 3. Patient to continue soft, supportive shoe gear 4. Patient to report any pedal injuries to medical professional  5. Follow up 3 months. Patient/POA to call should there be a concern in the interim.

## 2018-06-30 MED ORDER — OXYCODONE HCL 5 MG PO TABS: 5.0000 mg | ORAL_TABLET | ORAL | 0 refills | Status: DC | PRN

## 2018-06-30 NOTE — Telephone Encounter (Signed)
Patient requesting a refill on Oxycodone     LOV: 06/24/18  LRF:   06/02/18

## 2018-07-08 ENCOUNTER — Ambulatory Visit: Payer: PPO | Admitting: Family Medicine

## 2018-07-13 ENCOUNTER — Other Ambulatory Visit: Payer: Self-pay | Admitting: Family Medicine

## 2018-07-23 ENCOUNTER — Encounter: Payer: Self-pay | Admitting: Family Medicine

## 2018-07-24 ENCOUNTER — Encounter: Payer: Self-pay | Admitting: Family Medicine

## 2018-07-24 ENCOUNTER — Ambulatory Visit (INDEPENDENT_AMBULATORY_CARE_PROVIDER_SITE_OTHER): Payer: PPO | Admitting: Family Medicine

## 2018-07-24 VITALS — BP 110/60 | HR 94 | Temp 98.9°F | Resp 18 | Ht 69.0 in | Wt 209.0 lb

## 2018-07-24 DIAGNOSIS — B372 Candidiasis of skin and nail: Secondary | ICD-10-CM | POA: Diagnosis not present

## 2018-07-24 DIAGNOSIS — J209 Acute bronchitis, unspecified: Secondary | ICD-10-CM

## 2018-07-24 MED ORDER — CLOTRIMAZOLE-BETAMETHASONE 1-0.05 % EX CREA: 1.0000 "application " | TOPICAL_CREAM | Freq: Two times a day (BID) | CUTANEOUS | 0 refills | Status: DC

## 2018-07-24 MED ORDER — PREDNISONE 20 MG PO TABS: ORAL_TABLET | ORAL | 0 refills | Status: DC

## 2018-07-24 MED ORDER — ALBUTEROL SULFATE HFA 108 (90 BASE) MCG/ACT IN AERS: 2.0000 | INHALATION_SPRAY | RESPIRATORY_TRACT | 0 refills | Status: DC | PRN

## 2018-07-24 MED ORDER — LEVOFLOXACIN 500 MG PO TABS: 500.0000 mg | ORAL_TABLET | Freq: Every day | ORAL | 0 refills | Status: DC
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
## 2018-07-30 ENCOUNTER — Encounter: Payer: Self-pay | Admitting: Family Medicine

## 2018-07-30 DIAGNOSIS — K7581 Nonalcoholic steatohepatitis (NASH): Secondary | ICD-10-CM

## 2018-07-31 MED ORDER — OXYCODONE HCL 5 MG PO TABS: 5.0000 mg | ORAL_TABLET | ORAL | 0 refills | Status: DC | PRN

## 2018-07-31 NOTE — Addendum Note (Signed)
Addended by: Shary Decamp B on: 07/31/2018 10:51 AM   Modules accepted: Orders

## 2018-07-31 NOTE — Telephone Encounter (Signed)
Sorry it went to [facility] - can you please resend to [facility].

## 2018-07-31 NOTE — Telephone Encounter (Signed)
Patient requesting a refill on Oxycodone     LOV: 07/24/18  LRF:   06/30/18

## 2018-08-04 ENCOUNTER — Ambulatory Visit (INDEPENDENT_AMBULATORY_CARE_PROVIDER_SITE_OTHER): Payer: PPO | Admitting: Family Medicine

## 2018-08-04 ENCOUNTER — Encounter (HOSPITAL_COMMUNITY): Payer: Self-pay

## 2018-08-04 ENCOUNTER — Other Ambulatory Visit: Payer: Self-pay

## 2018-08-04 ENCOUNTER — Encounter: Payer: Self-pay | Admitting: Family Medicine

## 2018-08-04 ENCOUNTER — Emergency Department (HOSPITAL_COMMUNITY): Payer: PPO

## 2018-08-04 ENCOUNTER — Emergency Department (HOSPITAL_COMMUNITY)
Admission: EM | Admit: 2018-08-04 | Discharge: 2018-08-04 | Disposition: A | Discharge status: Home / Self Care | Payer: PPO | Attending: Emergency Medicine | Admitting: Emergency Medicine

## 2018-08-04 VITALS — BP 100/58 | HR 78 | Temp 98.0°F | Resp 12 | Ht 69.0 in | Wt 207.0 lb

## 2018-08-04 DIAGNOSIS — I959 Hypotension, unspecified: Secondary | ICD-10-CM | POA: Insufficient documentation

## 2018-08-04 DIAGNOSIS — I2510 Atherosclerotic heart disease of native coronary artery without angina pectoris: Secondary | ICD-10-CM | POA: Diagnosis not present

## 2018-08-04 DIAGNOSIS — R4182 Altered mental status, unspecified: Secondary | ICD-10-CM | POA: Diagnosis not present

## 2018-08-04 DIAGNOSIS — Z7982 Long term (current) use of aspirin: Secondary | ICD-10-CM | POA: Diagnosis not present

## 2018-08-04 DIAGNOSIS — Z794 Long term (current) use of insulin: Secondary | ICD-10-CM | POA: Diagnosis not present

## 2018-08-04 DIAGNOSIS — R4020 Unspecified coma: Secondary | ICD-10-CM | POA: Diagnosis not present

## 2018-08-04 DIAGNOSIS — I9589 Other hypotension: Secondary | ICD-10-CM

## 2018-08-04 DIAGNOSIS — R400 Somnolence: Secondary | ICD-10-CM

## 2018-08-04 DIAGNOSIS — E1165 Type 2 diabetes mellitus with hyperglycemia: Secondary | ICD-10-CM | POA: Insufficient documentation

## 2018-08-04 DIAGNOSIS — I129 Hypertensive chronic kidney disease with stage 1 through stage 4 chronic kidney disease, or unspecified chronic kidney disease: Secondary | ICD-10-CM | POA: Insufficient documentation

## 2018-08-04 DIAGNOSIS — R0600 Dyspnea, unspecified: Secondary | ICD-10-CM | POA: Diagnosis not present

## 2018-08-04 DIAGNOSIS — N189 Chronic kidney disease, unspecified: Secondary | ICD-10-CM | POA: Insufficient documentation

## 2018-08-04 DIAGNOSIS — E861 Hypovolemia: Secondary | ICD-10-CM

## 2018-08-04 DIAGNOSIS — J9811 Atelectasis: Secondary | ICD-10-CM | POA: Diagnosis not present

## 2018-08-04 DIAGNOSIS — E860 Dehydration: Secondary | ICD-10-CM | POA: Diagnosis not present

## 2018-08-04 DIAGNOSIS — Z79899 Other long term (current) drug therapy: Secondary | ICD-10-CM | POA: Insufficient documentation

## 2018-08-04 DIAGNOSIS — R410 Disorientation, unspecified: Secondary | ICD-10-CM | POA: Diagnosis not present

## 2018-08-04 DIAGNOSIS — R739 Hyperglycemia, unspecified: Secondary | ICD-10-CM

## 2018-08-04 LAB — COMPREHENSIVE METABOLIC PANEL
ALT: 44 U/L (ref 0–44)
AST: 28 U/L (ref 15–41)
Albumin: 2.9 g/dL — ABNORMAL LOW (ref 3.5–5.0)
Alkaline Phosphatase: 140 U/L — ABNORMAL HIGH (ref 38–126)
Anion gap: 6 (ref 5–15)
BUN: 44 mg/dL — AB (ref 8–23)
CHLORIDE: 105 mmol/L (ref 98–111)
CO2: 25 mmol/L (ref 22–32)
Calcium: 8.1 mg/dL — ABNORMAL LOW (ref 8.9–10.3)
Creatinine, Ser: 1.63 mg/dL — ABNORMAL HIGH (ref 0.61–1.24)
GFR calc Af Amer: 46 mL/min — ABNORMAL LOW (ref >60)
GFR calc non Af Amer: 40 mL/min — ABNORMAL LOW (ref >60)
Glucose, Bld: 405 mg/dL — ABNORMAL HIGH (ref 70–99)
Potassium: 4.7 mmol/L (ref 3.5–5.1)
Sodium: 136 mmol/L (ref 135–145)
Total Bilirubin: 0.5 mg/dL (ref 0.3–1.2)
Total Protein: 6.2 g/dL — ABNORMAL LOW (ref 6.5–8.1)

## 2018-08-04 LAB — BLOOD GAS, VENOUS
Acid-Base Excess: 0.1 mmol/L (ref 0.0–2.0)
Bicarbonate: 23.1 mmol/L (ref 20.0–28.0)
FIO2: 0.21
O2 Saturation: 69.4 %
PCO2 VEN: 60.1 mmHg — AB (ref 44.0–60.0)
Patient temperature: 36.5
pH, Ven: 7.264 (ref 7.250–7.430)
pO2, Ven: 41.4 mmHg (ref 32.0–45.0)

## 2018-08-04 LAB — CBC WITH DIFFERENTIAL/PLATELET
Abs Immature Granulocytes: 0.03 10*3/uL (ref 0.00–0.07)
Basophils Absolute: 0 10*3/uL (ref 0.0–0.1)
Basophils Relative: 1 %
Eosinophils Absolute: 0.2 10*3/uL (ref 0.0–0.5)
Eosinophils Relative: 3 %
HEMATOCRIT: 33.4 % — AB (ref 39.0–52.0)
Hemoglobin: 10.1 g/dL — ABNORMAL LOW (ref 13.0–17.0)
Immature Granulocytes: 1 %
Lymphocytes Relative: 16 %
Lymphs Abs: 1 10*3/uL (ref 0.7–4.0)
MCH: 29.9 pg (ref 26.0–34.0)
MCHC: 30.2 g/dL (ref 30.0–36.0)
MCV: 98.8 fL (ref 80.0–100.0)
MONOS PCT: 10 %
Monocytes Absolute: 0.6 10*3/uL (ref 0.1–1.0)
Neutro Abs: 4.5 10*3/uL (ref 1.7–7.7)
Neutrophils Relative %: 69 %
Platelets: 133 10*3/uL — ABNORMAL LOW (ref 150–400)
RBC: 3.38 MIL/uL — ABNORMAL LOW (ref 4.22–5.81)
RDW: 12.7 % (ref 11.5–15.5)
WBC: 6.4 10*3/uL (ref 4.0–10.5)
nRBC: 0 % (ref 0.0–0.2)

## 2018-08-04 LAB — CBG MONITORING, ED
Glucose-Capillary: 471 mg/dL — ABNORMAL HIGH (ref 70–99)
Glucose-Capillary: 416 mg/dL — ABNORMAL HIGH (ref 70–99)
Glucose-Capillary: 263 mg/dL — ABNORMAL HIGH (ref 70–99)

## 2018-08-04 LAB — URINALYSIS, ROUTINE W REFLEX MICROSCOPIC
Bacteria, UA: NONE SEEN
Bilirubin Urine: NEGATIVE
Glucose, UA: >=500 mg/dL — AB
Hgb urine dipstick: NEGATIVE
Ketones, ur: NEGATIVE mg/dL
Leukocytes, UA: NEGATIVE
Nitrite: NEGATIVE
PH: 5 (ref 5.0–8.0)
Protein, ur: NEGATIVE mg/dL
Specific Gravity, Urine: 1.016 (ref 1.005–1.030)

## 2018-08-04 LAB — TROPONIN I: Troponin I: <0.03 ng/mL (ref <0.03)

## 2018-08-04 LAB — AMMONIA: Ammonia: 17 umol/L (ref 9–35)

## 2018-08-04 LAB — LACTIC ACID, PLASMA
Lactic Acid, Venous: 1.8 mmol/L (ref 0.5–1.9)
Lactic Acid, Venous: 2.1 mmol/L (ref 0.5–1.9)

## 2018-08-04 LAB — GLUCOSE 16585: Glucose: >444 mg/dL — ABNORMAL HIGH (ref 65–99)

## 2018-08-04 MED ORDER — SODIUM CHLORIDE 0.9 % IV BOLUS
1000.0000 mL | Freq: Once | INTRAVENOUS | Status: AC
  Administered 2018-08-04: 1000 mL via INTRAVENOUS

## 2018-08-04 MED ORDER — INSULIN ASPART 100 UNIT/ML ~~LOC~~ SOLN
6.0000 [IU] | Freq: Once | SUBCUTANEOUS | Status: AC
  Administered 2018-08-04: 6 [IU] via SUBCUTANEOUS
  Filled 2018-08-04: qty 1

## 2018-08-04 NOTE — ED Provider Notes (Signed)
[facility] EMERGENCY DEPARTMENT Provider Note   CSN: [id number] Arrival date & time: 08/04/18  1228     History   Chief Complaint No chief complaint on file.   HPI Mark L Joost Sr. is a 77 y.o. male.  He is brought in by his wife after seeing his primary care doctor today and being referred here for increased confusion and elevated blood sugar.  It sounds like 2 weeks ago he had some sort of community-acquired pneumonia for which she was put on Levaquin and a prednisone taper.  He finished the prednisone about 3 days ago.  The patient has been sleeping more and his wife is noticed some slurred speech and a little more unsteadiness with his gait.  There is been one fall but she felt it was mechanical and reasonable tripping over something that was on the floor.  He denies being injured from that.  He said he still have a cough but it is not been productive and there is been no fevers or chills.  no chest pain no abdominal pain no nausea vomiting diarrhea or urinary symptoms.  No numbness or weakness.  He said he had a headache on Christmas but has not had one since then.  The history is provided by the patient and the spouse.  Altered Mental Status   This is a new problem. The current episode started more than 2 days ago. The problem has not changed since onset.Associated symptoms include confusion and somnolence. Risk factors include a recent infection. His past medical history is significant for diabetes.    Past Medical History:  Diagnosis Date  . Arthritis    lower back  . Chronic back pain    Disc disease  . Chronic kidney disease   . Coronary atherosclerosis of native coronary artery    Coronary calcifications by chest CT, Myoview demonstrating inferolateral scar  . Deafness in right ear   . Diabetes mellitus   . Diabetic retinopathy ([place])   . Diastolic dysfunction 11/5362   Grade 1.  . Essential hypertension, benign   . Heart murmur    in past  . HOH (hard of  hearing)   . Hyperkalemia   . Kidney stones   . Mixed hyperlipidemia   . NAFLD (nonalcoholic fatty liver disease)   . Pancreatic insufficiency    Diagnosed at [facility]  . Prostate cancer ([place]) 2011  . Shortness of breath dyspnea    occasional - liver pressing on right lung, decreased capacity  . Subdural hematoma ([place])   . Type 2 diabetes mellitus ([place])   . Wears hearing aid    "crossover" form right to left    Patient Active Problem List   Diagnosis Date Noted  . Peripheral edema 06/25/2018  . NAFLD (nonalcoholic fatty liver disease)   . Diabetic retinopathy ([place])   . ED (erectile dysfunction) 01/05/2014  . Shortness of breath 12/05/2011  . Coronary atherosclerosis of native coronary artery 12/05/2011  . Type 2 diabetes mellitus ([place]) 12/05/2011  . Essential hypertension, benign 12/05/2011  . Mixed hyperlipidemia 12/05/2011  . Malignant neoplasm of prostate ([place]) 06/26/2011    Past Surgical History:  Procedure Laterality Date  . APPENDECTOMY    . Bilateral hip replacement    . CATARACT EXTRACTION W/PHACO Left 05/15/2015   Procedure: CATARACT EXTRACTION PHACO AND INTRAOCULAR LENS PLACEMENT (IOC);  Surgeon: Ronnell Freshwater, MD;  Location: [place];  Service: Ophthalmology;  Laterality: Left;  DIABETIC - insulin pump and oral meds  .  CATARACT EXTRACTION W/PHACO Right 08/28/2015   Procedure: CATARACT EXTRACTION PHACO AND INTRAOCULAR LENS PLACEMENT (IOC);  Surgeon: Ronnell Freshwater, MD;  Location: [place];  Service: Ophthalmology;  Laterality: Right;  DIABETIC PER PT AND CINDY PLEASE KEEP ARRIVAL TIME AFTER 8AM   . CHOLECYSTECTOMY    . HERNIA REPAIR    . PROSTATECTOMY  2011        Home Medications    Prior to Admission medications   Medication Sig Start Date End Date Taking? Authorizing Provider  albuterol (PROVENTIL HFA;VENTOLIN HFA) 108 (90 Base) MCG/ACT inhaler Inhale 2 puffs into the lungs every 4 (four) hours as needed for  wheezing or shortness of breath. 07/24/18   [name], MD  amLODipine (NORVASC) 10 MG tablet Take 1 tablet (10 mg total) by mouth daily. 03/23/18   [name], MD  aspirin EC 81 MG tablet Take 81 mg by mouth at bedtime.     [provider]  carvedilol (COREG) 25 MG tablet TAKE 1 TABLET(25 MG) BY MOUTH TWICE DAILY 01/19/18   [name], MD  citalopram (CELEXA) 20 MG tablet TAKE 1 TABLET(20 MG) BY MOUTH DAILY 03/23/18   [name], MD  clotrimazole-betamethasone (LOTRISONE) cream Apply 1 application topically 2 (two) times daily. 07/24/18   [name], MD  diazepam (VALIUM) 5 MG tablet TAKE 1 TABLET BY MOUTH TWICE DAILY AS NEEDED FOR ANXIETY 05/11/18   [name], MD  doxazosin (CARDURA) 8 MG tablet TAKE 1 TABLET(8 MG) BY MOUTH DAILY 05/29/18   [name], MD  FREESTYLE LITE test strip USE 1 STRIP TO TEST BLOOD SUGAR THREE TIMES DAILY 02/13/17   Elayne Snare, MD  gabapentin (NEURONTIN) 300 MG capsule TAKE 2 CAPSULES(600 MG) BY MOUTH AT BEDTIME 07/13/18   [name], MD  hydrochlorothiazide (HYDRODIURIL) 25 MG tablet Take 1 tablet (25 mg total) by mouth daily. 04/10/18   [name], MD  insulin NPH Human (HUMULIN N,NOVOLIN N) 100 UNIT/ML injection 12u qam - 8 u qhs    [provider]  insulin regular (NOVOLIN R,HUMULIN R) 100 units/mL injection 10 u qam, 5-6 units at lunch and 8-10 u at dinner    [provider]  losartan (COZAAR) 100 MG tablet Take by mouth. 07/30/17 07/30/18  [provider]  mometasone (ELOCON) 0.1 % ointment Apply topically daily. 12/18/17   [name], MD  Multiple Vitamin (MULTIVITAMIN WITH MINERALS) TABS Take 1 tablet by mouth daily.    [provider]  oxyCODONE (ROXICODONE) 5 MG immediate release tablet Take 1 tablet (5 mg total) by mouth every 4 (four) hours as needed for severe pain. 07/31/18   [name], MD  pravastatin (PRAVACHOL) 10 MG tablet TAKE 1 TABLET(10  MG) BY MOUTH DAILY WITH BREAKFAST 06/25/18   [name], MD  triamcinolone cream (KENALOG) 0.1 % APPLY TOPICALLY TWICE DAILY 05/11/18   [name], MD  Vitamins A & D (VITAMIN A & D) cream Apply topically 2 (two) times daily. 03/10/18   [name], MD  ZENPEP 10000-32000 units CPEP TAKE 1 CAPSULE BY MOUTH THREE TIMES DAILY BEFORE MEALS 10/01/17   [name], MD    Family History Family History  Problem Relation Age of Onset  . Diabetes type II Mother   . Heart attack Father     Social History Social History   Tobacco Use  . Smoking status: Never Smoker  . Smokeless tobacco: Never Used  Substance Use Topics  . Alcohol use: No  . Drug use: No     Allergies   Enalapril maleate   Review of Systems Review of Systems  Constitutional: Negative for chills and fever.  HENT: Negative for sore throat.   Eyes: Negative for visual disturbance.  Respiratory: Positive for cough. Negative for shortness of breath.   Cardiovascular: Negative for chest pain.  Gastrointestinal: Negative for abdominal pain.  Genitourinary: Negative for dysuria.  Musculoskeletal: Negative for neck pain.  Skin: Negative for rash.  Neurological: Positive for speech difficulty and headaches.  Psychiatric/Behavioral: Positive for confusion.     Physical Exam Updated Vital Signs BP (!) 95/50 (BP Location: Right Arm)   Pulse 77   Temp 97.7 F (36.5 C) (Oral)   Resp 15   SpO2 97%   Physical Exam Vitals signs and nursing note reviewed.  Constitutional:      Appearance: He is well-developed.  HENT:     Head: Normocephalic and atraumatic.  Eyes:     Conjunctiva/sclera: Conjunctivae normal.  Neck:     Musculoskeletal: Neck supple.  Cardiovascular:     Rate and Rhythm: Normal rate and regular rhythm.     Heart sounds: No murmur.  Pulmonary:     Effort: Pulmonary effort is normal. No respiratory distress.     Breath sounds: Rhonchi (few scattered) present.  Abdominal:      Palpations: Abdomen is soft.     Tenderness: There is no abdominal tenderness.  Musculoskeletal:     Right lower leg: Edema present.     Left lower leg: Edema present.     Comments: Increased fullness and right calf versus left.  No cords or tenderness.  Skin:    General: Skin is warm and dry.     Capillary Refill: Capillary refill takes less than 2 seconds.  Neurological:     General: No focal deficit present.     Mental Status: He is alert and oriented to person, place, and time.     Cranial Nerves: No cranial nerve deficit.     Sensory: No sensory deficit.     Motor: No weakness.     Comments: Slightly slurred speech- different per wife.      ED Treatments / Results  Labs (all labs ordered are listed, but only abnormal results are displayed) Labs Reviewed  CBC WITH DIFFERENTIAL/PLATELET - Abnormal; Notable for the following components:      Result Value   RBC 3.38 (*)    Hemoglobin 10.1 (*)    HCT 33.4 (*)    Platelets 133 (*)    All other components within normal limits  COMPREHENSIVE METABOLIC PANEL - Abnormal; Notable for the following components:   Glucose, Bld 405 (*)    BUN 44 (*)    Creatinine, Ser 1.63 (*)    Calcium 8.1 (*)    Total Protein 6.2 (*)    Albumin 2.9 (*)    Alkaline Phosphatase 140 (*)    GFR calc non Af Amer 40 (*)    GFR calc Af Amer 46 (*)    All other components within normal limits  URINALYSIS, ROUTINE W REFLEX MICROSCOPIC - Abnormal; Notable for the following components:   Glucose, UA >=500 (*)    All other components within normal limits  BLOOD GAS, VENOUS - Abnormal; Notable for the following components:   pCO2, Ven 60.1 (*)    All other components within normal limits  LACTIC ACID, PLASMA - Abnormal; Notable for the following  components:   Lactic Acid, Venous 2.1 (*)    All other components within normal limits  CBG MONITORING, ED - Abnormal; Notable for the following components:   Glucose-Capillary 471 (*)    All other components  within normal limits  CBG MONITORING, ED - Abnormal; Notable for the following components:   Glucose-Capillary 416 (*)    All other components within normal limits  CBG MONITORING, ED - Abnormal; Notable for the following components:   Glucose-Capillary 263 (*)    All other components within normal limits  AMMONIA  TROPONIN I  LACTIC ACID, PLASMA  POC OCCULT BLOOD, ED    EKG EKG Interpretation  Date/Time:  Tuesday August 04 2018 14:45:17 EST Ventricular Rate:  72 PR Interval:    QRS Duration: 97 QT Interval:  410 QTC Calculation: 449 R Axis:   -35 Text Interpretation:  Sinus rhythm Left axis deviation Abnormal R-wave progression, late transition similar pattern to prior 2/15 Confirmed by Aletta Edouard [phone]) on 08/04/2018 2:59:05 PM   Radiology Dg Chest 2 View  Result Date: 08/04/2018 CLINICAL DATA:  Confusion. EXAM: CHEST - 2 VIEW COMPARISON:  Radiographs of August 02, 2014. FINDINGS: Stable cardiomediastinal silhouette. No pneumothorax is noted. Left lung is clear. Elevated right hemidiaphragm is noted with mild right basilar subsegmental atelectasis. Bony thorax is unremarkable. IMPRESSION: Elevated right hemidiaphragm with mild right basilar subsegmental atelectasis. Electronically Signed   By: Marijo Conception, M.D.   On: 08/04/2018 16:31   Ct Head Wo Contrast  Result Date: 08/04/2018 CLINICAL DATA:  Altered level of consciousness, hyperglycemia, decreased alertness EXAM: CT HEAD WITHOUT CONTRAST TECHNIQUE: Contiguous axial images were obtained from the base of the skull through the vertex without intravenous contrast. COMPARISON:  CT brain scan of 04/07/2015 FINDINGS: Brain: The ventricular system remains slightly prominent as are the cortical sulci, indicative of diffuse atrophy. The septum is midline in position. Moderate small vessel ischemic change is again noted throughout the periventricular white matter. No hemorrhage, mass lesion, or acute infarction is seen.  Vascular: No vascular abnormality is noted on this unenhanced study. Skull: On bone window images, no acute calvarial abnormality is seen. Sinuses/Orbits: However, there is evidence of maxillary and ethmoid sinus disease with considerable mucosal thickening. A retention cyst also is noted within the right maxillary sinus Other: None. IMPRESSION: 1. Diffuse changes of atrophy and moderate small vessel ischemic change. No acute intracranial abnormality. 2. Maxillary and ethmoid sinus disease. Electronically Signed   By: Ivar Drape M.D.   On: 08/04/2018 16:24    Procedures Procedures (including critical care time)  Medications Ordered in ED Medications  sodium chloride 0.9 % bolus 1,000 mL (0 mLs Intravenous Stopped 08/04/18 1746)  insulin aspart (novoLOG) injection 6 Units (6 Units Subcutaneous Given 08/04/18 1628)     Initial Impression / Assessment and Plan / ED Course  I have reviewed the triage vital signs and the nursing notes.  Pertinent labs & imaging results that were available during my care of the patient were reviewed by me and considered in my medical decision making (see chart for details).  Clinical Course as of Aug 04 1900  Tue Aug 04, 2945  6931 77 year old male sent in by his PCP for evaluation of confusion in the setting of a recent prednisone taper.  His blood sugar was critical high.  Here his glucose is 405 with no gap.  Lactate normal white count normal.  His hemoglobin is lower than recent for unclear reasons.  Creatinine is also  a little more elevated so will need some hydration.  Blood pressure little soft but lactate normal at 1.8.  Troponin negative.   [MB]  1324 Patient's sugar is improved here and he feels better.  Orthostatics were okay.  Blood pressures improved.  His lactate is a little bit worse on recheck, not really sure what to do with that.  His labs look dehydrated and I think his high blood sugars would account for that.  Last glucose 263 and the patient's  thirsty and hungry and would like to go home.  I think that is reasonable and he has a meter to check his blood sugars.  I asked him to touch base with his endocrinologist regarding if his medications need to be adjusted but this may be a transient thing now that he is been off the prednisone.  Understands to return if any worsening.   [MB]    Clinical Course User Index [MB] Hayden Rasmussen, MD     Final Clinical Impressions(s) / ED Diagnoses   Final diagnoses:  Hyperglycemia  Dehydration  Hypotension due to hypovolemia  Confusion    ED Discharge Orders    None       Hayden Rasmussen, MD 08/04/18 1902

## 2018-08-04 NOTE — Discharge Instructions (Addendum)
You were evaluated in the emergency department for elevated blood sugars and some dehydration and confusion.  This is likely secondary to the prednisone course you were put on.  You improved with some IV fluids and some insulin.  It will be important for you to continue to monitor your sugars and talk to your endocrinologist if anything needs to be adjusted.  This may just be a transient thing due to the course of prednisone.  Please watch for fever or any worsening signs of confusion or dehydration.  You were also slightly more anemic today and this will need to be followed up with your doctor.  Please return if any concerns.

## 2018-08-04 NOTE — ED Triage Notes (Signed)
Pt is sent from Dr. Dennard Schaumann. Has been on Prednisone for 7 days and stopped 3 days ago. Per PCP, has been having increased confusion and hyperglycemia. Wife states he has slept the last 3 days and is not very alert.
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
## 2018-08-11 ENCOUNTER — Ambulatory Visit (INDEPENDENT_AMBULATORY_CARE_PROVIDER_SITE_OTHER): Payer: PPO | Admitting: Family Medicine

## 2018-08-11 ENCOUNTER — Encounter: Payer: Self-pay | Admitting: Family Medicine

## 2018-08-11 VITALS — BP 100/64 | HR 84 | Temp 97.9°F | Resp 16 | Ht 69.0 in | Wt 196.0 lb

## 2018-08-11 DIAGNOSIS — R400 Somnolence: Secondary | ICD-10-CM | POA: Diagnosis not present

## 2018-08-11 DIAGNOSIS — Z09 Encounter for follow-up examination after completed treatment for conditions other than malignant neoplasm: Secondary | ICD-10-CM | POA: Diagnosis not present

## 2018-08-11 DIAGNOSIS — D649 Anemia, unspecified: Secondary | ICD-10-CM
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
## 2018-08-12 LAB — CBC WITH DIFFERENTIAL/PLATELET
Absolute Monocytes: 590 cells/uL (ref 200–950)
BASOS ABS: 33 {cells}/uL (ref 0–200)
Basophils Relative: 0.4 %
Eosinophils Absolute: 197 cells/uL (ref 15–500)
Eosinophils Relative: 2.4 %
HCT: 37.1 % — ABNORMAL LOW (ref 38.5–50.0)
Hemoglobin: 11.8 g/dL — ABNORMAL LOW (ref 13.2–17.1)
Lymphs Abs: 1533 cells/uL (ref 850–3900)
MCH: 30 pg (ref 27.0–33.0)
MCHC: 31.8 g/dL — ABNORMAL LOW (ref 32.0–36.0)
MCV: 94.4 fL (ref 80.0–100.0)
MPV: 10.2 fL (ref 7.5–12.5)
Monocytes Relative: 7.2 %
Neutro Abs: 5847 cells/uL (ref 1500–7800)
Neutrophils Relative %: 71.3 %
Platelets: 189 10*3/uL (ref 140–400)
RBC: 3.93 10*6/uL — AB (ref 4.20–5.80)
RDW: 12 % (ref 11.0–15.0)
Total Lymphocyte: 18.7 %
WBC: 8.2 10*3/uL (ref 3.8–10.8)

## 2018-08-12 LAB — BASIC METABOLIC PANEL WITH GFR
BUN/Creatinine Ratio: 17 (calc) (ref 6–22)
BUN: 23 mg/dL (ref 7–25)
CO2: 23 mmol/L (ref 20–32)
Calcium: 8.2 mg/dL — ABNORMAL LOW (ref 8.6–10.3)
Chloride: 104 mmol/L (ref 98–110)
Creat: 1.38 mg/dL — ABNORMAL HIGH (ref 0.70–1.18)
GFR, EST NON AFRICAN AMERICAN: 49 mL/min/{1.73_m2} — AB (ref >=60)
GFR, Est African American: 57 mL/min/{1.73_m2} — ABNORMAL LOW (ref >=60)
Glucose, Bld: 336 mg/dL — ABNORMAL HIGH (ref 65–99)
Potassium: 4.7 mmol/L (ref 3.5–5.3)
Sodium: 136 mmol/L (ref 135–146)

## 2018-08-12 LAB — VITAMIN B12: Vitamin B-12: 1012 pg/mL (ref 200–1100)

## 2018-08-12 LAB — IRON,TIBC AND FERRITIN PANEL
%SAT: 23 % (calc) (ref 20–48)
Ferritin: 113 ng/mL (ref 24–380)
Iron: 61 ug/dL (ref 50–180)
TIBC: 265 mcg/dL (calc) (ref 250–425)

## 2018-08-23 ENCOUNTER — Other Ambulatory Visit: Payer: Self-pay | Admitting: Family Medicine

## 2018-09-08 DIAGNOSIS — Z794 Long term (current) use of insulin: Secondary | ICD-10-CM | POA: Diagnosis not present

## 2018-09-08 DIAGNOSIS — E113293 Type 2 diabetes mellitus with mild nonproliferative diabetic retinopathy without macular edema, bilateral: Secondary | ICD-10-CM | POA: Diagnosis not present

## 2018-09-11 ENCOUNTER — Ambulatory Visit: Payer: PPO | Admitting: Podiatry

## 2018-09-14 DIAGNOSIS — E113313 Type 2 diabetes mellitus with moderate nonproliferative diabetic retinopathy with macular edema, bilateral: Secondary | ICD-10-CM | POA: Diagnosis not present

## 2018-09-14 LAB — HM DIABETES EYE EXAM

## 2018-09-15 DIAGNOSIS — I10 Essential (primary) hypertension: Secondary | ICD-10-CM | POA: Diagnosis not present

## 2018-09-15 DIAGNOSIS — E1159 Type 2 diabetes mellitus with other circulatory complications: Secondary | ICD-10-CM | POA: Diagnosis not present

## 2018-09-15 DIAGNOSIS — Z794 Long term (current) use of insulin: Secondary | ICD-10-CM | POA: Diagnosis not present

## 2018-09-15 DIAGNOSIS — E113293 Type 2 diabetes mellitus with mild nonproliferative diabetic retinopathy without macular edema, bilateral: Secondary | ICD-10-CM | POA: Diagnosis not present

## 2018-09-15 DIAGNOSIS — E1165 Type 2 diabetes mellitus with hyperglycemia: Secondary | ICD-10-CM | POA: Diagnosis not present

## 2018-09-15 DIAGNOSIS — E1142 Type 2 diabetes mellitus with diabetic polyneuropathy: Secondary | ICD-10-CM | POA: Insufficient documentation

## 2018-09-17 ENCOUNTER — Ambulatory Visit: Payer: PPO | Admitting: Podiatry

## 2018-09-21 ENCOUNTER — Other Ambulatory Visit: Payer: Self-pay | Admitting: Family Medicine

## 2018-09-21 ENCOUNTER — Encounter: Payer: Self-pay | Admitting: *Deleted

## 2018-09-21 DIAGNOSIS — K861 Other chronic pancreatitis: Secondary | ICD-10-CM

## 2018-09-28 ENCOUNTER — Encounter: Payer: Self-pay | Admitting: Family Medicine

## 2018-09-29 ENCOUNTER — Other Ambulatory Visit: Payer: Self-pay | Admitting: Family Medicine

## 2018-09-30 NOTE — Telephone Encounter (Signed)
Requesting refill    Valium  LOV: 08/11/18  LRF:  05/11/18

## 2018-10-14 ENCOUNTER — Encounter: Payer: Self-pay | Admitting: Podiatry

## 2018-10-14 ENCOUNTER — Ambulatory Visit: Payer: PPO | Admitting: Podiatry

## 2018-10-14 ENCOUNTER — Other Ambulatory Visit: Payer: Self-pay

## 2018-10-14 DIAGNOSIS — M79674 Pain in right toe(s): Secondary | ICD-10-CM | POA: Diagnosis not present

## 2018-10-14 DIAGNOSIS — E1151 Type 2 diabetes mellitus with diabetic peripheral angiopathy without gangrene: Secondary | ICD-10-CM

## 2018-10-14 DIAGNOSIS — B351 Tinea unguium: Secondary | ICD-10-CM | POA: Diagnosis not present

## 2018-10-14 DIAGNOSIS — B353 Tinea pedis: Secondary | ICD-10-CM

## 2018-10-14 DIAGNOSIS — M79675 Pain in left toe(s): Secondary | ICD-10-CM

## 2018-10-14 DIAGNOSIS — E1142 Type 2 diabetes mellitus with diabetic polyneuropathy: Secondary | ICD-10-CM | POA: Diagnosis not present

## 2018-10-14 MED ORDER — KETOCONAZOLE 2 % EX CREA: TOPICAL_CREAM | CUTANEOUS | 1 refills | Status: DC

## 2018-10-14 NOTE — Patient Instructions (Addendum)
Athlete's Foot  Athlete's foot (tinea pedis) is a fungal infection of the skin on your feet. It often occurs on the skin that is between or underneath the toes. It can also occur on the soles of your feet. The infection can spread from person to person (is contagious). It can also spread when a person's bare feet come in contact with the fungus on shower floors or on items such as shoes. What are the causes? This condition is caused by a fungus that grows in warm, moist places. You can get athlete's foot by sharing shoes, shower stalls, towels, and wet floors with someone who is infected. Not washing your feet or changing your socks often enough can also lead to athlete's foot. What increases the risk? This condition is more likely to develop in:  Men.  People who have a weak body defense system (immune system).  People who have diabetes.  People who use public showers, such as at a gym.  People who wear heavy-duty shoes, such as Environmental manager.  Seasons with warm, humid weather. What are the signs or symptoms? Symptoms of this condition include:  Itchy areas between your toes or on the soles of your feet.  White, flaky, or scaly areas between your toes or on the soles of your feet.  Very itchy small blisters between your toes or on the soles of your feet.  Small cuts in your skin. These cuts can become infected.  Thick or discolored toenails. How is this diagnosed? This condition may be diagnosed with a physical exam and a review of your medical history. Your health care provider may also take a skin or toenail sample to examine under a microscope. How is this treated? This condition is treated with antifungal medicines. These may be applied as powders, ointments, or creams. In severe cases, an oral antifungal medicine may be given. Follow these instructions at home: Medicines  Apply or take over-the-counter and prescription medicines only as told by your health  care provider.  Apply your antifungal medicine as told by your health care provider. Do not stop using the antifungal even if your condition improves. Foot care  Do not scratch your feet.  Keep your feet dry: ? Wear cotton or wool socks. Change your socks every day or if they become wet. ? Wear shoes that allow air to flow, such as sandals or canvas tennis shoes.  Wash and dry your feet, including the area between your toes. Also, wash and dry your feet: ? Every day or as told by your health care provider. ? After exercising. General instructions  Do not let others use towels, shoes, nail clippers, or other personal items that touch your feet.  Protect your feet by wearing sandals in wet areas, such as locker rooms and shared showers.  Keep all follow-up visits as told by your health care provider. This is important.  If you have diabetes, keep your blood sugar under control. Contact a health care provider if:  You have a fever.  You have swelling, soreness, warmth, or redness in your foot.  Your feet are not getting better with treatment.  Your symptoms get worse.  You have new symptoms. Summary  Athlete's foot (tinea pedis) is a fungal infection of the skin on your feet. It often occurs on skin that is between or underneath the toes.  This condition is caused by a fungus that grows in warm, moist places.  Symptoms include white, flaky, or scaly areas between  your toes or on the soles of your feet.  This condition is treated with antifungal medicines.  Keep your feet clean. Always dry them thoroughly. This information is not intended to replace advice given to you by your health care provider. Make sure you discuss any questions you have with your health care provider. Document Released: 07/19/2000 Document Revised: 05/12/2017 Document Reviewed: 05/12/2017 Elsevier Interactive Patient Education  2019 [employer]    Diabetes Mellitus and [place] care is  an important part of your health, especially when you have diabetes. Diabetes may cause you to have problems because of poor blood flow (circulation) to your feet and legs, which can cause your skin to:  Become thinner and drier.  Break more easily.  Heal more slowly.  Peel and crack. You may also have nerve damage (neuropathy) in your legs and feet, causing decreased feeling in them. This means that you may not notice minor injuries to your feet that could lead to more serious problems. Noticing and addressing any potential problems early is the best way to prevent future foot problems. How to care for your feet Foot hygiene  Wash your feet daily with warm water and mild soap. Do not use hot water. Then, pat your feet and the areas between your toes until they are completely dry. Do not soak your feet as this can dry your skin.  Trim your toenails straight across. Do not dig under them or around the cuticle. File the edges of your nails with an emery board or nail file.  Apply a moisturizing lotion or petroleum jelly to the skin on your feet and to dry, brittle toenails. Use lotion that does not contain alcohol and is unscented. Do not apply lotion between your toes. Shoes and socks  Wear clean socks or stockings every day. Make sure they are not too tight. Do not wear knee-high stockings since they may decrease blood flow to your legs.  Wear shoes that fit properly and have enough cushioning. Always look in your shoes before you put them on to be sure there are no objects inside.  To break in new shoes, wear them for just a few hours a day. This prevents injuries on your feet. Wounds, scrapes, corns, and calluses  Check your feet daily for blisters, cuts, bruises, sores, and redness. If you cannot see the bottom of your feet, use a mirror or ask someone for help.  Do not cut corns or calluses or try to remove them with medicine.  If you find a minor scrape, cut, or break in the skin  on your feet, keep it and the skin around it clean and dry. You may clean these areas with mild soap and water. Do not clean the area with peroxide, alcohol, or iodine.  If you have a wound, scrape, corn, or callus on your foot, look at it several times a day to make sure it is healing and not infected. Check for: ? Redness, swelling, or pain. ? Fluid or blood. ? Warmth. ? Pus or a bad smell. General instructions  Do not cross your legs. This may decrease blood flow to your feet.  Do not use heating pads or hot water bottles on your feet. They may burn your skin. If you have lost feeling in your feet or legs, you may not know this is happening until it is too late.  Protect your feet from hot and cold by wearing shoes, such as at the beach or on  hot pavement.  Schedule a complete foot exam at least once a year (annually) or more often if you have foot problems. If you have foot problems, report any cuts, sores, or bruises to your health care provider immediately. Contact a health care provider if:  You have a medical condition that increases your risk of infection and you have any cuts, sores, or bruises on your feet.  You have an injury that is not healing.  You have redness on your legs or feet.  You feel burning or tingling in your legs or feet.  You have pain or cramps in your legs and feet.  Your legs or feet are numb.  Your feet always feel cold.  You have pain around a toenail. Get help right away if:  You have a wound, scrape, corn, or callus on your foot and: ? You have pain, swelling, or redness that gets worse. ? You have fluid or blood coming from the wound, scrape, corn, or callus. ? Your wound, scrape, corn, or callus feels warm to the touch. ? You have pus or a bad smell coming from the wound, scrape, corn, or callus. ? You have a fever. ? You have a red line going up your leg. Summary  Check your feet every day for cuts, sores, red spots, swelling, and  blisters.  Moisturize feet and legs daily.  Wear shoes that fit properly and have enough cushioning.  If you have foot problems, report any cuts, sores, or bruises to your health care provider immediately.  Schedule a complete foot exam at least once a year (annually) or more often if you have foot problems. This information is not intended to replace advice given to you by your health care provider. Make sure you discuss any questions you have with your health care provider. Document Released: 07/19/2000 Document Revised: 09/03/2017 Document Reviewed: 08/23/2016 Elsevier Interactive Patient Education  2019 [employer].  Onychomycosis/Fungal Toenails  WHAT IS IT? An infection that lies within the keratin of your nail plate that is caused by a fungus.  WHY ME? Fungal infections affect all ages, sexes, races, and creeds.  There may be many factors that predispose you to a fungal infection such as age, coexisting medical conditions such as diabetes, or an autoimmune disease; stress, medications, fatigue, genetics, etc.  Bottom line: fungus thrives in a warm, moist environment and your shoes offer such a location.  IS IT CONTAGIOUS? Theoretically, yes.  You do not want to share shoes, nail clippers or files with someone who has fungal toenails.  Walking around barefoot in the same room or sleeping in the same bed is unlikely to transfer the organism.  It is important to realize, however, that fungus can spread easily from one nail to the next on the same foot.  HOW DO WE TREAT THIS?  There are several ways to treat this condition.  Treatment may depend on many factors such as age, medications, pregnancy, liver and kidney conditions, etc.  It is best to ask your doctor which options are available to you.  1. No treatment.   Unlike many other medical concerns, you can live with this condition.  However for many people this can be a painful condition and may lead to ingrown toenails or a bacterial  infection.  It is recommended that you keep the nails cut short to help reduce the amount of fungal nail. 2. Topical treatment.  These range from herbal remedies to prescription strength nail lacquers.  About 40-50%  effective, topicals require twice daily application for approximately 9 to 12 months or until an entirely new nail has grown out.  The most effective topicals are medical grade medications available through physicians offices. 3. Oral antifungal medications.  With an 80-90% cure rate, the most common oral medication requires 3 to 4 months of therapy and stays in your system for a year as the new nail grows out.  Oral antifungal medications do require blood work to make sure it is a safe drug for you.  A liver function panel will be performed prior to starting the medication and after the first month of treatment.  It is important to have the blood work performed to avoid any harmful side effects.  In general, this medication safe but blood work is required. 4. Laser Therapy.  This treatment is performed by applying a specialized laser to the affected nail plate.  This therapy is noninvasive, fast, and non-painful.  It is not covered by insurance and is therefore, out of pocket.  The results have been very good with a 80-95% cure rate.  [facility] is the only practice in the area to offer this therapy. 5. Permanent Nail Avulsion.  Removing the entire nail so that a new nail will not grow back.

## 2018-10-21 ENCOUNTER — Encounter: Payer: Self-pay | Admitting: Family Medicine

## 2018-10-21 DIAGNOSIS — K7581 Nonalcoholic steatohepatitis (NASH): Secondary | ICD-10-CM

## 2018-10-21 NOTE — Telephone Encounter (Signed)
Patient requesting a refill on Oxycodone     LOV:  08/11/18  LRF:   07/31/18

## 2018-10-22 MED ORDER — OXYCODONE HCL 5 MG PO TABS: 5.0000 mg | ORAL_TABLET | ORAL | 0 refills | Status: DC | PRN

## 2018-10-23 ENCOUNTER — Telehealth: Payer: Self-pay | Admitting: Family Medicine

## 2018-10-23 ENCOUNTER — Encounter: Payer: Self-pay | Admitting: Family Medicine

## 2018-10-23 DIAGNOSIS — K7581 Nonalcoholic steatohepatitis (NASH): Secondary | ICD-10-CM

## 2018-10-23 MED ORDER — OXYCODONE HCL 5 MG PO TABS: 5.0000 mg | ORAL_TABLET | ORAL | 0 refills | Status: DC | PRN

## 2018-10-23 NOTE — Telephone Encounter (Signed)
This was done through mychart note.

## 2018-10-23 NOTE — Telephone Encounter (Signed)
Please resend Oxycodone it went to mail order and it needs to go to [facility]. Rx set up to go to correct pharm.

## 2018-10-23 NOTE — Telephone Encounter (Signed)
Pt needs [place] to change pharmacy in system to [facility] [address]. Remove [facility] all together.   Pt needs oxycodone sent to [facility]. It was defaulted to [facility] but that is not correct.

## 2018-10-25 NOTE — Progress Notes (Signed)
Subjective: Mark L Zervas Sr. presents today with history of neuropathy with cc of painful, mycotic toenails.  Pain is aggravated when wearing enclosed shoe gear and relieved with periodic professional debridement.  Patient has peripheral neuropathy managed with gabapentin.  Patient has subjective symptoms of neuropathy described as numbness, tingling and burning.  [name], MD is his PCP. Last visit was 08/11/2018.   Current Outpatient Medications:  .  albuterol (PROVENTIL HFA;VENTOLIN HFA) 108 (90 Base) MCG/ACT inhaler, Inhale 2 puffs into the lungs every 4 (four) hours as needed for wheezing or shortness of breath., Disp: 1 Inhaler, Rfl: 0 .  amLODipine (NORVASC) 10 MG tablet, Take 1 tablet (10 mg total) by mouth daily., Disp: 90 tablet, Rfl: 3 .  aspirin EC 81 MG tablet, Take 81 mg by mouth at bedtime. , Disp: , Rfl:  .  carvedilol (COREG) 25 MG tablet, TAKE 1 TABLET(25 MG) BY MOUTH TWICE DAILY, Disp: 180 tablet, Rfl: 3 .  citalopram (CELEXA) 20 MG tablet, TAKE 1 TABLET(20 MG) BY MOUTH DAILY, Disp: 90 tablet, Rfl: 3 .  clotrimazole-betamethasone (LOTRISONE) cream, Apply 1 application topically 2 (two) times daily., Disp: 30 g, Rfl: 0 .  diazepam (VALIUM) 5 MG tablet, TAKE 1 TABLET BY MOUTH TWICE DAILY AS NEEDED FOR ANXIETY, Disp: 60 tablet, Rfl: 0 .  doxazosin (CARDURA) 8 MG tablet, TAKE 1 TABLET(8 MG) BY MOUTH DAILY, Disp: 90 tablet, Rfl: 3 .  FREESTYLE LITE test strip, USE 1 STRIP TO TEST BLOOD SUGAR THREE TIMES DAILY, Disp: 350 each, Rfl: 2 .  gabapentin (NEURONTIN) 300 MG capsule, TAKE 2 CAPSULES(600 MG) BY MOUTH AT BEDTIME, Disp: 180 capsule, Rfl: 3 .  hydrochlorothiazide (HYDRODIURIL) 25 MG tablet, Take 1 tablet (25 mg total) by mouth daily., Disp: 90 tablet, Rfl: 3 .  insulin NPH Human (HUMULIN N,NOVOLIN N) 100 UNIT/ML injection, 12u qam - 8 u qhs, Disp: , Rfl:  .  insulin regular (NOVOLIN R,HUMULIN R) 100 units/mL injection, Inject into the skin., Disp: , Rfl:  .   losartan (COZAAR) 100 MG tablet, TK 1 T PO QD, Disp: , Rfl:  .  mometasone (ELOCON) 0.1 % ointment, Apply topically daily., Disp: 45 g, Rfl: 1 .  Multiple Vitamin (MULTIVITAMIN WITH MINERALS) TABS, Take 1 tablet by mouth daily., Disp: , Rfl:  .  pravastatin (PRAVACHOL) 10 MG tablet, TAKE 1 TABLET(10 MG) BY MOUTH DAILY WITH BREAKFAST, Disp: 90 tablet, Rfl: 0 .  triamcinolone cream (KENALOG) 0.1 %, APPLY TOPICALLY TWICE DAILY, Disp: 30 g, Rfl: 0 .  Vitamins A & D (VITAMIN A & D) cream, Apply topically 2 (two) times daily., Disp: 14 g, Rfl: 0 .  ZENPEP 10000-32000 units CPEP, TAKE 1 CAPSULE BY MOUTH THREE TIMES DAILY BEFORE MEALS, Disp: 180 capsule, Rfl: 3 .  ketoconazole (NIZORAL) 2 % cream, Apply to both feet and between toes once daily for 6 weeks., Disp: 30 g, Rfl: 1 .  oxyCODONE (ROXICODONE) 5 MG immediate release tablet, Take 1 tablet (5 mg total) by mouth every 4 (four) hours as needed for severe pain., Disp: 60 tablet, Rfl: 0  Allergies  Allergen Reactions  . Enalapril Maleate Cough    Objective:  Vascular Examination: Capillary refill time immediate x 10 digits.  Dorsalis pedis pulses absent b/l.  Posterior tibial pulses 2/4 b/l.  Digital hair x 10 digits was absent.  Skin temperature gradient warm to cool b/l.  Dermatological Examination: Skin thin, shiny and atrophic b/l.  Toenails 1-5 b/l discolored, thick, dystrophic with subungual  debris and pain with palpation to nailbeds due to thickness of nails.  Diffuse scaling noted peripherally and plantarly b/l feet with mild foot odor.  No interdigital macerations.  No blisters, no weeping. No signs of secondary bacterial infection noted.  Musculoskeletal: Muscle strength 5/5 to all muscle groups b/l.  Neurological: Sensation with 10 gram monofilament is absent b/l.  Vibratory sensation absent b/l.  Assessment: 1. Painful onychomycosis toenails 1-5 b/l 2. NIDDM with neuropathy and PAD 3. Tinea pedis b/l 4. Edema b/l  LE  Plan: 1. Toenails 1-5 b/l were debrided in length and girth without iatrogenic bleeding. For tinea pedis, prescription sent to pharmacy for Ketoconazole Cream 2% to be applied to both feet and between toes qd x 6 weeks. 2. Patient to continue soft, supportive shoe gear. Will forward paperwork to PCP for diabetic shoes. Qualifying diagnoses: NIDDM with neuropathy, Edema, PAD 3. Patient to report any pedal injuries to medical professional  4. Follow up 3 months.  5. Patient/POA to call should there be a concern in the interim.

## 2018-11-24 ENCOUNTER — Encounter: Payer: Self-pay | Admitting: Family Medicine

## 2018-11-30 ENCOUNTER — Ambulatory Visit: Payer: PPO | Admitting: Orthotics

## 2018-12-02 ENCOUNTER — Inpatient Hospital Stay (HOSPITAL_COMMUNITY): Payer: PPO

## 2018-12-02 ENCOUNTER — Other Ambulatory Visit: Payer: Self-pay

## 2018-12-02 ENCOUNTER — Encounter (HOSPITAL_COMMUNITY): Payer: Self-pay

## 2018-12-02 ENCOUNTER — Inpatient Hospital Stay (HOSPITAL_COMMUNITY)
Admission: EM | Admit: 2018-12-02 | Discharge: 2018-12-05 | DRG: 641 | Disposition: A | Discharge status: Home / Self Care | Payer: PPO | Attending: Family Medicine | Admitting: Family Medicine

## 2018-12-02 ENCOUNTER — Emergency Department (HOSPITAL_COMMUNITY): Payer: PPO

## 2018-12-02 DIAGNOSIS — N183 Chronic kidney disease, stage 3 (moderate): Secondary | ICD-10-CM | POA: Diagnosis not present

## 2018-12-02 DIAGNOSIS — R05 Cough: Secondary | ICD-10-CM | POA: Diagnosis not present

## 2018-12-02 DIAGNOSIS — N189 Chronic kidney disease, unspecified: Secondary | ICD-10-CM | POA: Diagnosis present

## 2018-12-02 DIAGNOSIS — K8689 Other specified diseases of pancreas: Secondary | ICD-10-CM | POA: Diagnosis present

## 2018-12-02 DIAGNOSIS — Z7982 Long term (current) use of aspirin: Secondary | ICD-10-CM

## 2018-12-02 DIAGNOSIS — I10 Essential (primary) hypertension: Secondary | ICD-10-CM

## 2018-12-02 DIAGNOSIS — Z974 Presence of external hearing-aid: Secondary | ICD-10-CM | POA: Diagnosis not present

## 2018-12-02 DIAGNOSIS — Z9049 Acquired absence of other specified parts of digestive tract: Secondary | ICD-10-CM

## 2018-12-02 DIAGNOSIS — E1142 Type 2 diabetes mellitus with diabetic polyneuropathy: Secondary | ICD-10-CM | POA: Diagnosis present

## 2018-12-02 DIAGNOSIS — E860 Dehydration: Secondary | ICD-10-CM | POA: Diagnosis present

## 2018-12-02 DIAGNOSIS — H9190 Unspecified hearing loss, unspecified ear: Secondary | ICD-10-CM | POA: Diagnosis not present

## 2018-12-02 DIAGNOSIS — E11311 Type 2 diabetes mellitus with unspecified diabetic retinopathy with macular edema: Secondary | ICD-10-CM | POA: Diagnosis present

## 2018-12-02 DIAGNOSIS — Z96643 Presence of artificial hip joint, bilateral: Secondary | ICD-10-CM | POA: Diagnosis present

## 2018-12-02 DIAGNOSIS — E1165 Type 2 diabetes mellitus with hyperglycemia: Secondary | ICD-10-CM | POA: Diagnosis not present

## 2018-12-02 DIAGNOSIS — I1310 Hypertensive heart and chronic kidney disease without heart failure, with stage 1 through stage 4 chronic kidney disease, or unspecified chronic kidney disease: Secondary | ICD-10-CM | POA: Diagnosis not present

## 2018-12-02 DIAGNOSIS — N179 Acute kidney failure, unspecified: Secondary | ICD-10-CM | POA: Diagnosis not present

## 2018-12-02 DIAGNOSIS — M549 Dorsalgia, unspecified: Secondary | ICD-10-CM | POA: Diagnosis present

## 2018-12-02 DIAGNOSIS — Z79899 Other long term (current) drug therapy: Secondary | ICD-10-CM | POA: Diagnosis not present

## 2018-12-02 DIAGNOSIS — J069 Acute upper respiratory infection, unspecified: Secondary | ICD-10-CM | POA: Diagnosis not present

## 2018-12-02 DIAGNOSIS — K760 Fatty (change of) liver, not elsewhere classified: Secondary | ICD-10-CM | POA: Diagnosis present

## 2018-12-02 DIAGNOSIS — Z794 Long term (current) use of insulin: Secondary | ICD-10-CM

## 2018-12-02 DIAGNOSIS — G8929 Other chronic pain: Secondary | ICD-10-CM | POA: Diagnosis present

## 2018-12-02 DIAGNOSIS — R509 Fever, unspecified: Secondary | ICD-10-CM | POA: Diagnosis present

## 2018-12-02 DIAGNOSIS — E875 Hyperkalemia: Principal | ICD-10-CM | POA: Diagnosis present

## 2018-12-02 DIAGNOSIS — R0602 Shortness of breath: Secondary | ICD-10-CM | POA: Diagnosis not present

## 2018-12-02 DIAGNOSIS — E782 Mixed hyperlipidemia: Secondary | ICD-10-CM | POA: Diagnosis present

## 2018-12-02 DIAGNOSIS — F05 Delirium due to known physiological condition: Secondary | ICD-10-CM | POA: Diagnosis not present

## 2018-12-02 DIAGNOSIS — R4182 Altered mental status, unspecified: Secondary | ICD-10-CM | POA: Diagnosis present

## 2018-12-02 DIAGNOSIS — I2510 Atherosclerotic heart disease of native coronary artery without angina pectoris: Secondary | ICD-10-CM | POA: Diagnosis not present

## 2018-12-02 DIAGNOSIS — Z03818 Encounter for observation for suspected exposure to other biological agents ruled out: Secondary | ICD-10-CM | POA: Diagnosis not present

## 2018-12-02 DIAGNOSIS — Z833 Family history of diabetes mellitus: Secondary | ICD-10-CM

## 2018-12-02 DIAGNOSIS — A419 Sepsis, unspecified organism: Secondary | ICD-10-CM

## 2018-12-02 DIAGNOSIS — R000 Tachycardia, unspecified: Secondary | ICD-10-CM | POA: Diagnosis not present

## 2018-12-02 DIAGNOSIS — H9191 Unspecified hearing loss, right ear: Secondary | ICD-10-CM | POA: Diagnosis present

## 2018-12-02 DIAGNOSIS — E1122 Type 2 diabetes mellitus with diabetic chronic kidney disease: Secondary | ICD-10-CM | POA: Diagnosis present

## 2018-12-02 DIAGNOSIS — R0902 Hypoxemia: Secondary | ICD-10-CM | POA: Diagnosis not present

## 2018-12-02 DIAGNOSIS — Z20828 Contact with and (suspected) exposure to other viral communicable diseases: Secondary | ICD-10-CM | POA: Diagnosis present

## 2018-12-02 DIAGNOSIS — Z79891 Long term (current) use of opiate analgesic: Secondary | ICD-10-CM

## 2018-12-02 DIAGNOSIS — Z8249 Family history of ischemic heart disease and other diseases of the circulatory system: Secondary | ICD-10-CM

## 2018-12-02 DIAGNOSIS — Z8546 Personal history of malignant neoplasm of prostate: Secondary | ICD-10-CM

## 2018-12-02 LAB — ABO/RH: ABO/RH(D): O POS

## 2018-12-02 LAB — COMPREHENSIVE METABOLIC PANEL
ALT: 48 U/L — ABNORMAL HIGH (ref 0–44)
AST: 26 U/L (ref 15–41)
Albumin: 2.9 g/dL — ABNORMAL LOW (ref 3.5–5.0)
Alkaline Phosphatase: 161 U/L — ABNORMAL HIGH (ref 38–126)
Anion gap: 8 (ref 5–15)
BUN: 32 mg/dL — ABNORMAL HIGH (ref 8–23)
CO2: 19 mmol/L — ABNORMAL LOW (ref 22–32)
Calcium: 8.4 mg/dL — ABNORMAL LOW (ref 8.9–10.3)
Chloride: 111 mmol/L (ref 98–111)
Creatinine, Ser: 1.9 mg/dL — ABNORMAL HIGH (ref 0.61–1.24)
GFR calc Af Amer: 39 mL/min — ABNORMAL LOW (ref >60)
GFR calc non Af Amer: 33 mL/min — ABNORMAL LOW (ref >60)
Glucose, Bld: 132 mg/dL — ABNORMAL HIGH (ref 70–99)
Potassium: 6.1 mmol/L — ABNORMAL HIGH (ref 3.5–5.1)
Sodium: 138 mmol/L (ref 135–145)
Total Bilirubin: 0.4 mg/dL (ref 0.3–1.2)
Total Protein: 6.3 g/dL — ABNORMAL LOW (ref 6.5–8.1)

## 2018-12-02 LAB — CBC WITH DIFFERENTIAL/PLATELET
Abs Immature Granulocytes: 0.02 10*3/uL (ref 0.00–0.07)
Basophils Absolute: 0 10*3/uL (ref 0.0–0.1)
Basophils Relative: 1 %
Eosinophils Absolute: 0.2 10*3/uL (ref 0.0–0.5)
Eosinophils Relative: 3 %
HCT: 36.5 % — ABNORMAL LOW (ref 39.0–52.0)
Hemoglobin: 11 g/dL — ABNORMAL LOW (ref 13.0–17.0)
Immature Granulocytes: 0 %
Lymphocytes Relative: 16 %
Lymphs Abs: 1.4 10*3/uL (ref 0.7–4.0)
MCH: 30.1 pg (ref 26.0–34.0)
MCHC: 30.1 g/dL (ref 30.0–36.0)
MCV: 99.7 fL (ref 80.0–100.0)
Monocytes Absolute: 0.6 10*3/uL (ref 0.1–1.0)
Monocytes Relative: 7 %
Neutro Abs: 6.3 10*3/uL (ref 1.7–7.7)
Neutrophils Relative %: 73 %
Platelets: 178 10*3/uL (ref 150–400)
RBC: 3.66 MIL/uL — ABNORMAL LOW (ref 4.22–5.81)
RDW: 14.6 % (ref 11.5–15.5)
WBC: 8.6 10*3/uL (ref 4.0–10.5)
nRBC: 0 % (ref 0.0–0.2)

## 2018-12-02 LAB — PROCALCITONIN: Procalcitonin: 0.1 ng/mL

## 2018-12-02 LAB — TSH: TSH: 1.955 u[IU]/mL (ref 0.350–4.500)

## 2018-12-02 LAB — LACTIC ACID, PLASMA
Lactic Acid, Venous: 1.4 mmol/L (ref 0.5–1.9)
Lactic Acid, Venous: 0.9 mmol/L (ref 0.5–1.9)

## 2018-12-02 LAB — URINALYSIS, ROUTINE W REFLEX MICROSCOPIC
Bilirubin Urine: NEGATIVE
Glucose, UA: NEGATIVE mg/dL
Hgb urine dipstick: NEGATIVE
Ketones, ur: NEGATIVE mg/dL
Leukocytes,Ua: NEGATIVE
Nitrite: NEGATIVE
Protein, ur: NEGATIVE mg/dL
Specific Gravity, Urine: 1.014 (ref 1.005–1.030)
pH: 5 (ref 5.0–8.0)

## 2018-12-02 LAB — GLUCOSE, CAPILLARY: Glucose-Capillary: 159 mg/dL — ABNORMAL HIGH (ref 70–99)

## 2018-12-02 LAB — FERRITIN: Ferritin: 64 ng/mL (ref 24–336)

## 2018-12-02 LAB — PROTIME-INR
INR: 1.2 (ref 0.8–1.2)
Prothrombin Time: 14.8 seconds (ref 11.4–15.2)

## 2018-12-02 LAB — SARS CORONAVIRUS 2 BY RT PCR (HOSPITAL ORDER, PERFORMED IN ~~LOC~~ HOSPITAL LAB): SARS Coronavirus 2: NEGATIVE

## 2018-12-02 LAB — SEDIMENTATION RATE: Sed Rate: 11 mm/hr (ref 0–16)

## 2018-12-02 LAB — FIBRINOGEN: Fibrinogen: 471 mg/dL (ref 210–475)

## 2018-12-02 LAB — D-DIMER, QUANTITATIVE: D-Dimer, Quant: 0.75 ug/mL-FEU — ABNORMAL HIGH (ref 0.00–0.50)

## 2018-12-02 LAB — LACTATE DEHYDROGENASE: LDH: 132 U/L (ref 98–192)

## 2018-12-02 LAB — APTT: aPTT: 36 seconds (ref 24–36)

## 2018-12-02 MED ORDER — ACETAMINOPHEN 650 MG RE SUPP: 650.0000 mg | Freq: Four times a day (QID) | RECTAL | Status: DC | PRN

## 2018-12-02 MED ORDER — ACETAMINOPHEN 500 MG PO TABS
1000.0000 mg | ORAL_TABLET | Freq: Once | ORAL | Status: AC
  Administered 2018-12-02: 10:00:00 1000 mg via ORAL
  Filled 2018-12-02: qty 2

## 2018-12-02 MED ORDER — SODIUM CHLORIDE 0.9 % IV SOLN
2.0000 g | Freq: Once | INTRAVENOUS | Status: AC
  Administered 2018-12-02: 2 g via INTRAVENOUS
  Filled 2018-12-02: qty 2

## 2018-12-02 MED ORDER — VANCOMYCIN HCL 1.5 G IV SOLR
1500.0000 mg | Freq: Once | INTRAVENOUS | Status: AC
  Administered 2018-12-02: 1500 mg via INTRAVENOUS
  Filled 2018-12-02: qty 1500

## 2018-12-02 MED ORDER — SODIUM CHLORIDE 0.9 % IV SOLN
2.0000 g | Freq: Two times a day (BID) | INTRAVENOUS | Status: DC
  Administered 2018-12-02: 2 g via INTRAVENOUS
  Filled 2018-12-02: qty 2

## 2018-12-02 MED ORDER — SODIUM CHLORIDE 0.9 % IV SOLN: 2.0000 g | Freq: Once | INTRAVENOUS | Status: DC

## 2018-12-02 MED ORDER — ONDANSETRON HCL 4 MG PO TABS: 4.0000 mg | ORAL_TABLET | Freq: Four times a day (QID) | ORAL | Status: DC | PRN

## 2018-12-02 MED ORDER — CARVEDILOL 12.5 MG PO TABS
25.0000 mg | ORAL_TABLET | Freq: Two times a day (BID) | ORAL | Status: DC
  Administered 2018-12-02 – 2018-12-05 (×6): 25 mg via ORAL
  Filled 2018-12-02 (×6): qty 2

## 2018-12-02 MED ORDER — SODIUM CHLORIDE 0.9 % IV BOLUS
1000.0000 mL | Freq: Once | INTRAVENOUS | Status: AC
  Administered 2018-12-02: 12:00:00 1000 mL via INTRAVENOUS

## 2018-12-02 MED ORDER — TECHNETIUM TO 99M ALBUMIN AGGREGATED
4.1800 | Freq: Once | INTRAVENOUS | Status: AC | PRN
  Administered 2018-12-02: 20:00:00 4.18 via INTRAVENOUS

## 2018-12-02 MED ORDER — ADULT MULTIVITAMIN W/MINERALS CH
1.0000 | ORAL_TABLET | Freq: Every day | ORAL | Status: DC
  Administered 2018-12-03: 1 via ORAL
  Filled 2018-12-02 (×2): qty 1

## 2018-12-02 MED ORDER — ACETAMINOPHEN 325 MG PO TABS: 650.0000 mg | ORAL_TABLET | Freq: Four times a day (QID) | ORAL | Status: DC | PRN

## 2018-12-02 MED ORDER — INSULIN ASPART 100 UNIT/ML ~~LOC~~ SOLN
0.0000 [IU] | Freq: Three times a day (TID) | SUBCUTANEOUS | Status: DC
  Administered 2018-12-03: 09:00:00 1 [IU] via SUBCUTANEOUS
  Administered 2018-12-04: 2 [IU] via SUBCUTANEOUS
  Administered 2018-12-04: 3 [IU] via SUBCUTANEOUS
  Administered 2018-12-04: 2 [IU] via SUBCUTANEOUS
  Administered 2018-12-05: 1 [IU] via SUBCUTANEOUS

## 2018-12-02 MED ORDER — SODIUM CHLORIDE 0.9 % IV SOLN
INTRAVENOUS | Status: DC | PRN
  Administered 2018-12-02: 10:00:00 1000 mL via INTRAVENOUS

## 2018-12-02 MED ORDER — DOXAZOSIN MESYLATE 2 MG PO TABS
8.0000 mg | ORAL_TABLET | Freq: Every day | ORAL | Status: DC
  Administered 2018-12-02 – 2018-12-04 (×3): 8 mg via ORAL
  Filled 2018-12-02 (×3): qty 4

## 2018-12-02 MED ORDER — ENOXAPARIN SODIUM 30 MG/0.3ML ~~LOC~~ SOLN: 30.0000 mg | SUBCUTANEOUS | Status: DC

## 2018-12-02 MED ORDER — SODIUM ZIRCONIUM CYCLOSILICATE 10 G PO PACK
10.0000 g | PACK | Freq: Three times a day (TID) | ORAL | Status: AC
  Administered 2018-12-02 – 2018-12-03 (×2): 10 g via ORAL
  Filled 2018-12-02 (×2): qty 1

## 2018-12-02 MED ORDER — INSULIN GLARGINE 100 UNIT/ML ~~LOC~~ SOLN
15.0000 [IU] | Freq: Every day | SUBCUTANEOUS | Status: DC
  Administered 2018-12-02: 15 [IU] via SUBCUTANEOUS
  Filled 2018-12-02 (×2): qty 0.15

## 2018-12-02 MED ORDER — DEXTROSE 50 % IV SOLN
INTRAVENOUS | Status: AC
  Administered 2018-12-02: 12:00:00 50 mL via INTRAVENOUS
  Filled 2018-12-02: qty 50

## 2018-12-02 MED ORDER — ALBUTEROL SULFATE (2.5 MG/3ML) 0.083% IN NEBU
2.5000 mg | INHALATION_SOLUTION | RESPIRATORY_TRACT | Status: DC | PRN
  Administered 2018-12-04: 2.5 mg via RESPIRATORY_TRACT
  Filled 2018-12-02: qty 3

## 2018-12-02 MED ORDER — SODIUM CHLORIDE 0.9 % IV SOLN: 1.0000 g | Freq: Once | INTRAVENOUS | Status: DC

## 2018-12-02 MED ORDER — CALCIUM GLUCONATE-NACL 1-0.675 GM/50ML-% IV SOLN
1.0000 g | Freq: Once | INTRAVENOUS | Status: AC
  Administered 2018-12-02: 1000 mg via INTRAVENOUS
  Filled 2018-12-02: qty 50

## 2018-12-02 MED ORDER — INSULIN ASPART 100 UNIT/ML ~~LOC~~ SOLN: 0.0000 [IU] | Freq: Every day | SUBCUTANEOUS | Status: DC

## 2018-12-02 MED ORDER — SODIUM BICARBONATE 8.4 % IV SOLN
50.0000 meq | Freq: Once | INTRAVENOUS | Status: AC
  Administered 2018-12-02: 50 meq via INTRAVENOUS
  Filled 2018-12-02: qty 50

## 2018-12-02 MED ORDER — AMLODIPINE BESYLATE 5 MG PO TABS
10.0000 mg | ORAL_TABLET | Freq: Every day | ORAL | Status: DC
  Administered 2018-12-03 – 2018-12-05 (×3): 10 mg via ORAL
  Filled 2018-12-02 (×3): qty 2

## 2018-12-02 MED ORDER — VANCOMYCIN HCL IN DEXTROSE 1-5 GM/200ML-% IV SOLN: 1000.0000 mg | Freq: Once | INTRAVENOUS | Status: DC

## 2018-12-02 MED ORDER — ALBUTEROL SULFATE HFA 108 (90 BASE) MCG/ACT IN AERS: 2.0000 | INHALATION_SPRAY | RESPIRATORY_TRACT | Status: DC | PRN

## 2018-12-02 MED ORDER — VANCOMYCIN HCL 10 G IV SOLR
1250.0000 mg | INTRAVENOUS | Status: DC
  Filled 2018-12-02: qty 1250

## 2018-12-02 MED ORDER — INSULIN ASPART 100 UNIT/ML ~~LOC~~ SOLN
10.0000 [IU] | Freq: Once | SUBCUTANEOUS | Status: AC
  Administered 2018-12-02: 10 [IU] via INTRAVENOUS
  Filled 2018-12-02: qty 1

## 2018-12-02 MED ORDER — SODIUM CHLORIDE 0.9 % IV SOLN: INTRAVENOUS | Status: DC

## 2018-12-02 MED ORDER — DEXTROSE 50 % IV SOLN
50.0000 mL | Freq: Once | INTRAVENOUS | Status: AC
  Administered 2018-12-02: 12:00:00 50 mL via INTRAVENOUS

## 2018-12-02 MED ORDER — SODIUM ZIRCONIUM CYCLOSILICATE 10 G PO PACK: 10.0000 g | PACK | Freq: Three times a day (TID) | ORAL | Status: AC

## 2018-12-02 MED ORDER — PANCRELIPASE (LIP-PROT-AMYL) 36000-114000 UNITS PO CPEP
36000.0000 [IU] | ORAL_CAPSULE | Freq: Three times a day (TID) | ORAL | Status: DC
  Administered 2018-12-03 – 2018-12-05 (×6): 36000 [IU] via ORAL
  Filled 2018-12-02 (×7): qty 1

## 2018-12-02 MED ORDER — PRAVASTATIN SODIUM 10 MG PO TABS
10.0000 mg | ORAL_TABLET | Freq: Every day | ORAL | Status: DC
  Administered 2018-12-02 – 2018-12-04 (×3): 10 mg via ORAL
  Filled 2018-12-02 (×3): qty 1

## 2018-12-02 MED ORDER — INSULIN ASPART 100 UNIT/ML ~~LOC~~ SOLN
6.0000 [IU] | Freq: Three times a day (TID) | SUBCUTANEOUS | Status: DC
  Administered 2018-12-03 (×2): 6 [IU] via SUBCUTANEOUS

## 2018-12-02 MED ORDER — ASPIRIN EC 81 MG PO TBEC
81.0000 mg | DELAYED_RELEASE_TABLET | Freq: Every day | ORAL | Status: DC
  Administered 2018-12-02 – 2018-12-04 (×3): 81 mg via ORAL
  Filled 2018-12-02 (×3): qty 1

## 2018-12-02 MED ORDER — ONDANSETRON HCL 4 MG/2ML IJ SOLN: 4.0000 mg | Freq: Four times a day (QID) | INTRAMUSCULAR | Status: DC | PRN

## 2018-12-02 MED ORDER — METRONIDAZOLE IN NACL 5-0.79 MG/ML-% IV SOLN
500.0000 mg | Freq: Three times a day (TID) | INTRAVENOUS | Status: DC
  Administered 2018-12-02 – 2018-12-03 (×2): 500 mg via INTRAVENOUS
  Filled 2018-12-02 (×2): qty 100

## 2018-12-02 NOTE — ED Notes (Signed)
Dr Wilson Singer ok with one set of blood culture due to lab not accepting all bottles sent to lab due to labeling issue

## 2018-12-02 NOTE — H&P (Signed)
History and Physical  Tod Abrahamsen ELT:[id number] DOB: November 04, 1940 DOA: 12/02/2018  Referring physician: Archie Patten  PCP: [name], MD   Chief Complaint: weakness   HPI: [name] is a 78 y.o. male with chronic back pain on oxycodone tablets at home, chronic kidney disease, type 2 diabetes mellitus on insulin, diabetic complications including retinopathy and nephropathy, essential hypertension, diastolic heart dysfunction, coronary artery disease who was brought into the emergency department by EMS with complaints of 1 day of increasing weakness, fever and altered mentation.  Patient had a fever greater than 101 at home according to his wife.  They apparently have been monitoring his blood glucose readings at home and they have been in the low 100 range.  He has not had any changes in his medications or activities.  He has had episodes of confusion associated with his home medications in the past.  He is followed by his primary care provider for that.  The patient apparently does continue to take oxycodone.  He had been taken off Valium by his primary care provider.  On arrival he was noted to be hypoxic with a pulse ox of 82% and was given supplemental oxygen with improvement to 95%.  The patient denied headache and visual changes.  There was no nuchal rigidity.  His chest x-ray was unremarkable for any acute findings.  His metabolic panel was positive for hyperkalemia with a sodium of 6.1.  His creatinine was noted to be 1.90.  His d-dimer was elevated at 0.75.  His white blood cell count was 8.6.  Hemoglobin was 11.0, platelet count 178.  Lactic acid 1.4.  Procalcitonin 0.10.  Blood glucose 132.  His urinalysis was unremarkable with no signs of infection found.  His EKG showed signs of tachycardia and hyperkalemia with abnormal R wave progression and slight peaked T waves.  Sed rate was 11.  He did have COVID-19 testing done in the ED that was negative.  Blood cultures obtained and  urine cultures obtained.  The patient was started on broad-spectrum antibiotic therapy and admission was requested.  His vital signs have been stable with a heart rate of 102, blood pressure 97/66, initial temperature 102.2 improved to 100.1 after Tylenol administration.  The patient remained 99% oxygen saturation on 1 L nasal cannula.  Review of Systems: All systems reviewed and apart from history of presenting illness, are negative.  Past Medical History:  Diagnosis Date   Arthritis    lower back   Chronic back pain    Disc disease   Chronic kidney disease    Coronary atherosclerosis of native coronary artery    Coronary calcifications by chest CT, Myoview demonstrating inferolateral scar   Deafness in right ear    Diabetes mellitus    Diabetic retinopathy (HCC)    moderate nonproliferative with macular edema in right eye   Diastolic dysfunction 12/6859   Grade 1.   Essential hypertension, benign    Heart murmur    in past   HOH (hard of hearing)    Hyperkalemia    Kidney stones    Mixed hyperlipidemia    NAFLD (nonalcoholic fatty liver disease)    Pancreatic insufficiency    Diagnosed at [facility]   Prostate cancer [facility]) 2011   Shortness of breath dyspnea    occasional - liver pressing on right lung, decreased capacity   Subdural hematoma ([place])    Type 2 diabetes mellitus ([place])    Wears hearing aid    "  crossover" form right to left   Past Surgical History:  Procedure Laterality Date   APPENDECTOMY     Bilateral hip replacement     CATARACT EXTRACTION W/PHACO Left 05/15/2015   Procedure: CATARACT EXTRACTION PHACO AND INTRAOCULAR LENS PLACEMENT ([place]);  Surgeon: Ronnell Freshwater, MD;  Location: [place];  Service: Ophthalmology;  Laterality: Left;  DIABETIC - insulin pump and oral meds   CATARACT EXTRACTION W/PHACO Right 08/28/2015   Procedure: CATARACT EXTRACTION PHACO AND INTRAOCULAR LENS PLACEMENT ([facility]);  Surgeon: Ronnell Freshwater, MD;  Location: [place];  Service: Ophthalmology;  Laterality: Right;  DIABETIC PER PT AND CINDY PLEASE KEEP ARRIVAL TIME AFTER 8AM    CHOLECYSTECTOMY     HERNIA REPAIR     PROSTATECTOMY  2011   Social History:  reports that he has never smoked. He has never used smokeless tobacco. He reports that he does not drink alcohol or use drugs.  Allergies  Allergen Reactions   Enalapril Maleate Cough    Family History  Problem Relation Age of Onset   Diabetes type II Mother    Heart attack Father     Prior to Admission medications   Medication Sig Start Date End Date Taking? Authorizing Provider  albuterol (PROVENTIL HFA;VENTOLIN HFA) 108 (90 Base) MCG/ACT inhaler Inhale 2 puffs into the lungs every 4 (four) hours as needed for wheezing or shortness of breath. 07/24/18  Yes [name], MD  amLODipine (NORVASC) 10 MG tablet Take 1 tablet (10 mg total) by mouth daily. 03/23/18  Yes [name], MD  aspirin EC 81 MG tablet Take 81 mg by mouth at bedtime.    Yes [provider]  carvedilol (COREG) 25 MG tablet TAKE 1 TABLET(25 MG) BY MOUTH TWICE DAILY Patient taking differently: Take 25 mg by mouth 2 (two) times daily with a meal.  01/19/18  Yes Pickard, Cammie Mcgee, MD  doxazosin (CARDURA) 8 MG tablet TAKE 1 TABLET(8 MG) BY MOUTH DAILY Patient taking differently: Take 8 mg by mouth daily.  08/24/18  Yes [name], MD  gabapentin (NEURONTIN) 300 MG capsule TAKE 2 CAPSULES(600 MG) BY MOUTH AT BEDTIME Patient taking differently: Take 600 mg by mouth at bedtime.  07/13/18  Yes [name], MD  hydrochlorothiazide (HYDRODIURIL) 25 MG tablet Take 1 tablet (25 mg total) by mouth daily. 04/10/18  Yes [name], MD  insulin NPH Human (HUMULIN N,NOVOLIN N) 100 UNIT/ML injection Inject 8-12 Units into the skin at bedtime. Take 12 units qam and 8 units qhs   Yes [provider]  insulin regular (NOVOLIN R,HUMULIN R) 100 units/mL  injection Inject 8 Units into the skin 3 (three) times daily before meals.    Yes [provider]  losartan (COZAAR) 100 MG tablet Take 100 mg by mouth daily.  09/25/18  Yes [provider]  metFORMIN (GLUCOPHAGE-XR) 500 MG 24 hr tablet Take 500 mg by mouth 2 (two) times daily.  10/19/18  Yes [provider]  Multiple Vitamin (MULTIVITAMIN WITH MINERALS) TABS Take 1 tablet by mouth daily.   Yes [provider]  oxyCODONE (ROXICODONE) 5 MG immediate release tablet Take 1 tablet (5 mg total) by mouth every 4 (four) hours as needed for severe pain. Patient taking differently: Take 5 mg by mouth 2 (two) times daily.  10/23/18  Yes [name], MD  pravastatin (PRAVACHOL) 10 MG tablet TAKE 1 TABLET(10 MG) BY MOUTH DAILY WITH BREAKFAST Patient taking differently: Take 10  mg by mouth daily.  09/21/18  Yes Pickard, Cammie Mcgee, MD  ZENPEP 10000-32000 units CPEP TAKE 1 CAPSULE BY MOUTH THREE TIMES DAILY BEFORE MEALS Patient taking differently: Take 1 capsule by mouth 3 (three) times daily before meals.  09/21/18  Yes [name], MD   Physical Exam: Vitals:   12/02/18 1100 12/02/18 1130 12/02/18 1200 12/02/18 1209  BP: 108/60 107/62 97/66   Pulse: (!) 105 (!) 103 (!) 104   Resp: 14 15 14    Temp:    100.1 F (37.8 C)  TempSrc:    Rectal  SpO2: 98% 98% 99%   Weight:      Height:         General exam: Chronically ill-appearing male, lying comfortably supine on the gurney in no obvious distress.  He is somewhat lethargic but easily arousable and does not appear toxic.  Head, eyes and ENT: Nontraumatic and normocephalic. Pupils equally reacting to light and accommodation. Oral mucosa dry.  Neck: Supple. No JVD, carotid bruit or thyromegaly.  Lymphatics: No lymphadenopathy.  Respiratory system: Clear to auscultation. No increased work of breathing.  No rales heard and no basilar crackles heard.  Cardiovascular system: S1 and S2 heard, tachycardic rate. No  JVD, murmurs, gallops, clicks or pedal edema.  Gastrointestinal system: Abdomen is nondistended, soft and nontender. Normal bowel sounds heard. No organomegaly or masses appreciated.  Central nervous system: Alert and oriented. No focal neurological deficits.  Extremities: Symmetric 5 x 5 power. Peripheral pulses symmetrically felt.   Skin: No rashes or acute findings.  Musculoskeletal system: Negative exam.  Psychiatry: Pleasant and cooperative.  Labs on Admission:  Basic Metabolic Panel: Recent Labs  Lab 12/02/18 0927  NA 138  K 6.1*  CL 111  CO2 19*  GLUCOSE 132*  BUN 32*  CREATININE 1.90*  CALCIUM 8.4*   Liver Function Tests: Recent Labs  Lab 12/02/18 0927  AST 26  ALT 48*  ALKPHOS 161*  BILITOT 0.4  PROT 6.3*  ALBUMIN 2.9*   No results for input(s): LIPASE, AMYLASE in the last 168 hours. No results for input(s): AMMONIA in the last 168 hours. CBC: Recent Labs  Lab 12/02/18 1023  WBC 8.6  NEUTROABS 6.3  HGB 11.0*  HCT 36.5*  MCV 99.7  PLT 178   Cardiac Enzymes: No results for input(s): CKTOTAL, CKMB, CKMBINDEX, TROPONINI in the last 168 hours.  BNP (last 3 results) No results for input(s): PROBNP in the last 8760 hours. CBG: No results for input(s): GLUCAP in the last 168 hours.  Radiological Exams on Admission: Dg Chest Portable 1 View  Result Date: 12/02/2018 CLINICAL DATA:  Pt brought in by EMS due to weakness, ams and fever. Wife reported to EMS that pt had been weak for several days, altered last nigh (not acting himself), not walking well and fever 101.4. Noted to have a cough CBG 134 bp 140/80 s.*comment was truncated*fever, cough EXAM: PORTABLE CHEST 1 VIEW COMPARISON:  Radiograph [id number] FINDINGS: Stable cardiac silhouette. Low lung volumes. RIGHT pleural effusion unchanged. No overt pulmonary edema. IMPRESSION: No significant change. Low lung volumes and moderate RIGHT effusion. Electronically Signed   By: Suzy Bouchard M.D.   On:  12/02/2018 10:14   EKG: Independently reviewed.  Slightly peaked T waves and abnormal R wave progression, sinus tachycardia  Assessment/Plan Active Problems:   Coronary atherosclerosis of native coronary artery   Essential hypertension, benign   Mixed hyperlipidemia   NAFLD (nonalcoholic fatty liver disease)   DM type 2  with diabetic peripheral neuropathy ([place])   Uncontrolled type 2 diabetes mellitus with hyperglycemia (HCC)   Hyperkalemia   Fever, unknown origin   Altered mental state   Acute on chronic renal failure [facility])   Pancreatic insufficiency   Wears hearing aid   HOH (hard of hearing)   1. Fever of unknown origin- I suspect that he likely has an underlying pneumonia.  I have ordered a CT chest without contrast to further evaluate.  He is being treated empirically with broad-spectrum antibiotics and he has been sent for urine and blood cultures which we will follow.  Continue to treat supportively.  Do not feel that he needs a spinal tap at this time as he has no nuchal rigidity and will continue to monitor and assess. 2. Altered mentation-likely secondary to home medications.  Discontinue all centrally acting medications.  Discontinue oxycodone.  Urine drug screen pending.  Consider naloxone injection. 3. Hyperkalemia- discontinue losartan, continue Lokelma and fluid hydration and follow closely. 4. Uncontrolled type 2 diabetes mellitus with hyperglycemia and renal and neurological complications- resume sliding scale coverage and CBG testing with Lantus and prandial insulin ordered. 5. Essential hypertension- resuming home blood pressure medications with the exception of losartan in the setting of hyperkalemia. 6. Pancreatic insufficiency-this is chronic and we have resumed his home enzyme supplements. 7. Acute on chronic renal failure- he is being gently hydrated with IV fluids and we do plan to follow renal function panel and adjust medication doses as needed for renal  insufficiency. 8. Elevated d-dimer- VQ scan pending as an evaluation for PE. 9. Mixed hyperlipidemia-resume home pravastatin.  DVT Prophylaxis: Lovenox Code Status: Full Family Communication: Wife telephone Disposition Plan: Inpatient  Time spent: 7 minutes  Retina Bernardy Wynetta Emery, MD [facility] How to contact the [facility] Attending or Consulting provider [place] or covering provider during after hours [place], for this patient?  1. Check the care team in [facility] and look for a) attending/consulting [facility] provider listed and b) the [facility] team listed 2. Log into [email or web address] and use [place]'s universal password to access. If you do not have the password, please contact the hospital operator. 3. Locate the [facility] provider you are looking for under [facility] and page to a number that you can be directly reached. 4. If you still have difficulty reaching the provider, please page the [facility] (Director on Call) for the Hospitalists listed on amion for assistance.

## 2018-12-02 NOTE — ED Notes (Signed)
2 set of blood cultures obtained and sent to lab

## 2018-12-02 NOTE — ED Provider Notes (Signed)
[facility] EMERGENCY DEPARTMENT Provider Note   CSN: [id number] Arrival date & time: 12/02/18  8101    History   Chief Complaint Chief Complaint  Patient presents with  . Weakness  . Fever    HPI Mark L Hardebeck Sr. is a 78 y.o. male.  HPI   78 year old male coming in for evaluation of generalized weakness and fever.  Onset yesterday.  According to wife patient seemed confused since last night.  Very drowsy today.  Patient himself has no complaints aside from feeling extremely fatigued.  Denies any acute pain.  No urinary complaints.  No shortness of breath.  EMS did note a cough and also his oxygen saturation was 82% on room air.  Past Medical History:  Diagnosis Date  . Arthritis    lower back  . Chronic back pain    Disc disease  . Chronic kidney disease   . Coronary atherosclerosis of native coronary artery    Coronary calcifications by chest CT, Myoview demonstrating inferolateral scar  . Deafness in right ear   . Diabetes mellitus   . Diabetic retinopathy ([place])    moderate nonproliferative with macular edema in right eye  . Diastolic dysfunction 02/5101   Grade 1.  . Essential hypertension, benign   . Heart murmur    in past  . HOH (hard of hearing)   . Hyperkalemia   . Kidney stones   . Mixed hyperlipidemia   . NAFLD (nonalcoholic fatty liver disease)   . Pancreatic insufficiency    Diagnosed at [facility]  . Prostate cancer ([place]) 2011  . Shortness of breath dyspnea    occasional - liver pressing on right lung, decreased capacity  . Subdural hematoma ([place])   . Type 2 diabetes mellitus ([place])   . Wears hearing aid    "crossover" form right to left    Patient Active Problem List   Diagnosis Date Noted  . DM type 2 with diabetic peripheral neuropathy ([place]) 09/15/2018  . Uncontrolled type 2 diabetes mellitus with hyperglycemia ([place]) 09/15/2018  . Peripheral edema 06/25/2018  . NAFLD (nonalcoholic fatty liver disease)   . Diabetic retinopathy ([place])   . ED  (erectile dysfunction) 01/05/2014  . Shortness of breath 12/05/2011  . Coronary atherosclerosis of native coronary artery 12/05/2011  . Type 2 diabetes mellitus ([place]) 12/05/2011  . Essential hypertension, benign 12/05/2011  . Mixed hyperlipidemia 12/05/2011  . Malignant neoplasm of prostate ([place]) 06/26/2011    Past Surgical History:  Procedure Laterality Date  . APPENDECTOMY    . Bilateral hip replacement    . CATARACT EXTRACTION W/PHACO Left 05/15/2015   Procedure: CATARACT EXTRACTION PHACO AND INTRAOCULAR LENS PLACEMENT (IOC);  Surgeon: Ronnell Freshwater, MD;  Location: [facility];  Service: Ophthalmology;  Laterality: Left;  DIABETIC - insulin pump and oral meds  . CATARACT EXTRACTION W/PHACO Right 08/28/2015   Procedure: CATARACT EXTRACTION PHACO AND INTRAOCULAR LENS PLACEMENT (IOC);  Surgeon: Ronnell Freshwater, MD;  Location: [facility];  Service: Ophthalmology;  Laterality: Right;  DIABETIC PER PT AND CINDY PLEASE KEEP ARRIVAL TIME AFTER 8AM   . CHOLECYSTECTOMY    . HERNIA REPAIR    . PROSTATECTOMY  2011        Home Medications    Prior to Admission medications   Medication Sig Start Date End Date Taking? Authorizing Provider  albuterol (PROVENTIL HFA;VENTOLIN HFA) 108 (90 Base) MCG/ACT inhaler Inhale 2 puffs into the lungs every 4 (four) hours as needed for wheezing or shortness  of breath. 07/24/18  Yes [name], MD  amLODipine (NORVASC) 10 MG tablet Take 1 tablet (10 mg total) by mouth daily. 03/23/18  Yes [name], MD  aspirin EC 81 MG tablet Take 81 mg by mouth at bedtime.    Yes [provider]  carvedilol (COREG) 25 MG tablet TAKE 1 TABLET(25 MG) BY MOUTH TWICE DAILY Patient taking differently: Take 25 mg by mouth 2 (two) times daily with a meal.  01/19/18  Yes Pickard, Cammie Mcgee, MD  doxazosin (CARDURA) 8 MG tablet TAKE 1 TABLET(8 MG) BY MOUTH DAILY Patient taking differently: Take 8 mg by mouth daily.  08/24/18   Yes [name], MD  gabapentin (NEURONTIN) 300 MG capsule TAKE 2 CAPSULES(600 MG) BY MOUTH AT BEDTIME Patient taking differently: Take 600 mg by mouth at bedtime.  07/13/18  Yes [name], MD  hydrochlorothiazide (HYDRODIURIL) 25 MG tablet Take 1 tablet (25 mg total) by mouth daily. 04/10/18  Yes [name], MD  insulin NPH Human (HUMULIN N,NOVOLIN N) 100 UNIT/ML injection Inject 8-12 Units into the skin at bedtime. Take 12 units qam and 8 units qhs   Yes [provider]  insulin regular (NOVOLIN R,HUMULIN R) 100 units/mL injection Inject 8 Units into the skin 3 (three) times daily before meals.    Yes [provider]  losartan (COZAAR) 100 MG tablet Take 100 mg by mouth daily.  09/25/18  Yes [provider]  metFORMIN (GLUCOPHAGE-XR) 500 MG 24 hr tablet Take 500 mg by mouth 2 (two) times daily.  10/19/18  Yes [provider]  Multiple Vitamin (MULTIVITAMIN WITH MINERALS) TABS Take 1 tablet by mouth daily.   Yes [provider]  oxyCODONE (ROXICODONE) 5 MG immediate release tablet Take 1 tablet (5 mg total) by mouth every 4 (four) hours as needed for severe pain. Patient taking differently: Take 5 mg by mouth 2 (two) times daily.  10/23/18  Yes [name], MD  pravastatin (PRAVACHOL) 10 MG tablet TAKE 1 TABLET(10 MG) BY MOUTH DAILY WITH BREAKFAST Patient taking differently: Take 10 mg by mouth daily.  09/21/18  Yes Pickard, Cammie Mcgee, MD  ZENPEP 10000-32000 units CPEP TAKE 1 CAPSULE BY MOUTH THREE TIMES DAILY BEFORE MEALS Patient taking differently: Take 1 capsule by mouth 3 (three) times daily before meals.  09/21/18  Yes [name], MD  citalopram (CELEXA) 20 MG tablet TAKE 1 TABLET(20 MG) BY MOUTH DAILY Patient not taking: Reported on 12/02/2018 03/23/18   [name], MD  clotrimazole-betamethasone (LOTRISONE) cream Apply 1 application topically 2 (two) times daily. Patient not taking: Reported on 12/02/2018 07/24/18    [name], MD  diazepam (VALIUM) 5 MG tablet TAKE 1 TABLET BY MOUTH TWICE DAILY AS NEEDED FOR ANXIETY Patient not taking: Reported on 12/02/2018 10/01/18   [name], MD  FREESTYLE LITE test strip USE 1 STRIP TO TEST BLOOD SUGAR THREE TIMES DAILY Patient not taking: Reported on 12/02/2018 02/13/17   Elayne Snare, MD  ketoconazole (NIZORAL) 2 % cream Apply to both feet and between toes once daily for 6 weeks. Patient not taking: Reported on 12/02/2018 10/14/18   Marzetta Board, DPM  mometasone (ELOCON) 0.1 % ointment Apply topically daily. Patient not taking: Reported on 12/02/2018 12/18/17   [name], MD  triamcinolone cream (KENALOG) 0.1 % APPLY TOPICALLY TWICE DAILY Patient not taking: Reported on 12/02/2018 05/11/18   [name], MD  Vitamins A & D (VITAMIN  A & D) cream Apply topically 2 (two) times daily. Patient not taking: Reported on 12/02/2018 03/10/18   [name], MD    Family History Family History  Problem Relation Age of Onset  . Diabetes type II Mother   . Heart attack Father     Social History Social History   Tobacco Use  . Smoking status: Never Smoker  . Smokeless tobacco: Never Used  Substance Use Topics  . Alcohol use: No  . Drug use: No     Allergies   Enalapril maleate   Review of Systems Review of Systems  All systems reviewed and negative, other than as noted in HPI.  Physical Exam Updated Vital Signs BP 108/60   Pulse (!) 105   Temp (!) 102.2 F (39 C) (Rectal)   Resp 14   Ht 5' 9"  (1.753 m)   Wt 93 kg   SpO2 98%   BMI 30.27 kg/m   Physical Exam Vitals signs and nursing note reviewed.  Constitutional:      Appearance: He is well-developed.     Comments: Laying in bed.  Appears very tired, but nontoxic.  HENT:     Head: Normocephalic and atraumatic.  Eyes:     General:        Right eye: No discharge.        Left eye: No discharge.     Conjunctiva/sclera: Conjunctivae normal.  Neck:      Musculoskeletal: Neck supple.  Cardiovascular:     Rate and Rhythm: Regular rhythm. Tachycardia present.     Heart sounds: Normal heart sounds. No murmur. No friction rub. No gallop.   Pulmonary:     Effort: Pulmonary effort is normal. No respiratory distress.     Comments: Diminished breath sounds bilaterally.  No adventitious breath sounds noted. Abdominal:     General: There is no distension.     Palpations: Abdomen is soft.     Tenderness: There is no abdominal tenderness.  Musculoskeletal:        General: No tenderness.  Skin:    General: Skin is warm and dry.  Neurological:     Mental Status: He is alert.  Psychiatric:        Behavior: Behavior normal.        Thought Content: Thought content normal.      ED Treatments / Results  Labs (all labs ordered are listed, but only abnormal results are displayed) Labs Reviewed  COMPREHENSIVE METABOLIC PANEL - Abnormal; Notable for the following components:      Result Value   Potassium 6.1 (*)    CO2 19 (*)    Glucose, Bld 132 (*)    BUN 32 (*)    Creatinine, Ser 1.90 (*)    Calcium 8.4 (*)    Total Protein 6.3 (*)    Albumin 2.9 (*)    ALT 48 (*)    Alkaline Phosphatase 161 (*)    GFR calc non Af Amer 33 (*)    GFR calc Af Amer 39 (*)    All other components within normal limits  D-DIMER, QUANTITATIVE (NOT AT [facility]) - Abnormal; Notable for the following components:   D-Dimer, Quant 0.75 (*)    All other components within normal limits  CBC WITH DIFFERENTIAL/PLATELET - Abnormal; Notable for the following components:   RBC 3.66 (*)    Hemoglobin 11.0 (*)    HCT 36.5 (*)    All other components within normal limits  RAPID URINE DRUG  SCREEN, HOSP PERFORMED - Abnormal; Notable for the following components:   Benzodiazepines POSITIVE (*)    All other components within normal limits  COMPREHENSIVE METABOLIC PANEL - Abnormal; Notable for the following components:   Chloride 114 (*)    CO2 20 (*)    Glucose, Bld 172 (*)     BUN 30 (*)    Creatinine, Ser 1.68 (*)    Calcium 8.2 (*)    Total Protein 5.8 (*)    Albumin 2.6 (*)    Alkaline Phosphatase 136 (*)    GFR calc non Af Amer 39 (*)    GFR calc Af Amer 45 (*)    All other components within normal limits  CBC WITH DIFFERENTIAL/PLATELET - Abnormal; Notable for the following components:   RBC 3.21 (*)    Hemoglobin 9.8 (*)    HCT 32.8 (*)    MCV 102.2 (*)    MCHC 29.9 (*)    Platelets 146 (*)    All other components within normal limits  C-REACTIVE PROTEIN - Abnormal; Notable for the following components:   CRP 6.6 (*)    All other components within normal limits  GLUCOSE, CAPILLARY - Abnormal; Notable for the following components:   Glucose-Capillary 159 (*)    All other components within normal limits  GLUCOSE, CAPILLARY - Abnormal; Notable for the following components:   Glucose-Capillary 156 (*)    All other components within normal limits  GLUCOSE, CAPILLARY - Abnormal; Notable for the following components:   Glucose-Capillary 138 (*)    All other components within normal limits  SARS CORONAVIRUS 2 (HOSPITAL ORDER, [place] [facility])  CULTURE, BLOOD (ROUTINE X 2) W REFLEX TO ID PANEL  CULTURE, BLOOD (ROUTINE X 2)  URINE CULTURE  FERRITIN  LACTATE DEHYDROGENASE  PROCALCITONIN  SEDIMENTATION RATE  URINALYSIS, ROUTINE W REFLEX MICROSCOPIC  LACTIC ACID, PLASMA  FIBRINOGEN  LACTIC ACID, PLASMA  TSH  APTT  PROTIME-INR  MAGNESIUM  INTERLEUKIN-6, PLASMA  HEMOGLOBIN A1C  ABO/RH    EKG EKG Interpretation  Date/Time:  Wednesday December 02 2018 09:18:16 EDT Ventricular Rate:  109 PR Interval:    QRS Duration: 90 QT Interval:  311 QTC Calculation: 419 R Axis:   -34 Text Interpretation:  Sinus tachycardia Left axis deviation Abnormal R-wave progression, late transition Suggestive of hyperkalemia Confirmed by Virgel Manifold [phone]) on 12/02/2018 11:16:05 AM   Radiology Dg Chest Portable 1 View  Result Date:  12/02/2018 CLINICAL DATA:  Pt brought in by EMS due to weakness, ams and fever. Wife reported to EMS that pt had been weak for several days, altered last nigh (not acting himself), not walking well and fever 101.4. Noted to have a cough CBG 134 bp 140/80 s.*comment was truncated*fever, cough EXAM: PORTABLE CHEST 1 VIEW COMPARISON:  Radiograph [id number] FINDINGS: Stable cardiac silhouette. Low lung volumes. RIGHT pleural effusion unchanged. No overt pulmonary edema. IMPRESSION: No significant change. Low lung volumes and moderate RIGHT effusion. Electronically Signed   By: Suzy Bouchard M.D.   On: 12/02/2018 10:14    Procedures Procedures (including critical care time)  CRITICAL CARE Performed by: Virgel Manifold Total critical care time: 35 minutes Critical care time was exclusive of separately billable procedures and treating other patients. Hyperkalemia with EKG changes necessitating IV bicarb, insulin, Ca. Hypoxemia in 80%s.   Critical care was necessary to treat or prevent imminent or life-threatening deterioration. Critical care was time spent personally by me on the following activities: development of treatment plan  with patient and/or surrogate as well as nursing, discussions with consultants, evaluation of patient's response to treatment, examination of patient, obtaining history from patient or surrogate, ordering and performing treatments and interventions, ordering and review of laboratory studies, ordering and review of radiographic studies, pulse oximetry and re-evaluation of patient's condition.   Medications Ordered in ED Medications  vancomycin (VANCOCIN) 1,500 mg in sodium chloride 0.9 % 500 mL IVPB (1,500 mg Intravenous New Bag/Given 12/02/18 0956)  0.9 %  sodium chloride infusion (1,000 mLs Intravenous New Bag/Given 12/02/18 0948)  sodium bicarbonate injection 50 mEq (has no administration in time range)  sodium chloride 0.9 % bolus 1,000 mL (has no administration in time range)   dextrose 50 % solution 50 mL (has no administration in time range)  insulin aspart (novoLOG) injection 10 Units (has no administration in time range)  calcium gluconate 1 g/ 50 mL sodium chloride IVPB (has no administration in time range)  ceFEPIme (MAXIPIME) 2 g in sodium chloride 0.9 % 100 mL IVPB (0 g Intravenous Stopped 12/02/18 1020)  acetaminophen (TYLENOL) tablet 1,000 mg (1,000 mg Oral Given 12/02/18 0950)     Initial Impression / Assessment and Plan / ED Course  I have reviewed the triage vital signs and the nursing notes.  Pertinent labs & imaging results that were available during my care of the patient were reviewed by me and considered in my medical decision making (see chart for details).     78 year old male with generalized weakness confusion per wife.  He is febrile.  My exam he is very drowsy but easy to treat questions appropriately.  He denies any acute complaints aside from the fatigue.  Do not have a clear source of his fever at this time.  He did receive empiric antibiotics.  COVID testing was negative although his hypoxemia and seeming relatively comfortable is concerning.  O2 sats in the mid to high 90s on minimal oxygen per nasal cannula.  Chest x-ray similar to priors.  Also noted acute on chronic kidney impairment.  Hyperkalemia with EKG changes.  Is given a bolus of IV fluids.  Bicarb, calcium and insulin for treatment of hyperkalemia.  I expect this further to improve with ongoing hydration and improvement of renal function.   Mark L Birch Sr. was evaluated in Emergency Department on 12/02/2018 for the symptoms described in the history of present illness. He was evaluated in the context of the global COVID-19 pandemic, which necessitated consideration that the patient might be at risk for infection with the SARS-CoV-2 virus that causes COVID-19. Institutional protocols and algorithms that pertain to the evaluation of patients at risk for COVID-19 are in a state of  rapid change based on information released by regulatory bodies including the CDC and federal and state organizations. These policies and algorithms were followed during the patient's care in the ED.   Final Clinical Impressions(s) / ED Diagnoses   Final diagnoses:  Fever, unspecified fever cause  AKI (acute kidney injury) ([place])  Hyperkalemia    ED Discharge Orders    None       Virgel Manifold, MD 12/03/18 1006

## 2018-12-02 NOTE — ED Notes (Signed)
Patient transported to CT 

## 2018-12-02 NOTE — ED Triage Notes (Addendum)
Pt brought in by EMS due to weakness, ams and fever. Wife reported to EMS that pt had been weak for several days, altered last nigh (not acting himself), not walking well and fever 101.4. Noted to have a cough CBG 134 bp 140/80 sats initially 82 % then up to 95 % with O2

## 2018-12-02 NOTE — ED Triage Notes (Signed)
EDP at bedside  

## 2018-12-02 NOTE — Progress Notes (Signed)
Pharmacy Antibiotic Note  [name]. is a 78 y.o. male admitted on 12/02/2018 with infection- unknown source.  Pharmacy has been consulted for Vancomycin and Cefepime dosing.  Plan: Vancomycin 1500 mg IV x 1 dose. Vancomycin 1250 mg IV every 24 hours.  Goal trough 15-20 mcg/mL.  Cefepime 2000 mg IV every 12 hours. Monitor labs, c/s, and vanco levels as indicated.  Height: 5' 9"  (175.3 cm) Weight: 205 lb (93 kg) IBW/kg (Calculated) : 70.7  Temp (24hrs), Avg:101.2 F (38.4 C), Min:100.1 F (37.8 C), Max:102.2 F (39 C)  Recent Labs  Lab 12/02/18 0908 12/02/18 0927 12/02/18 1023  WBC  --   --  8.6  CREATININE  --  1.90*  --   LATICACIDVEN 1.4  --   --     Estimated Creatinine Clearance: 36.7 mL/min (A) (by C-G formula based on SCr of 1.9 mg/dL (H)).    Allergies  Allergen Reactions  . Enalapril Maleate Cough    Antimicrobials this admission: Vanco 4/29 >>  Cefepime 4/29 >>   Dose adjustments this admission: Vanco/Cefepime  Microbiology results: 4/29 BCx: pending 4/29 UCx: pending  4/29 SARS coronavirus: negative  Thank you for allowing pharmacy to be a part of this patient's care.  Ramond Craver 12/02/2018 1:25 PM

## 2018-12-02 NOTE — ED Notes (Signed)
Wife(Darlene Stifter) left cell # [phone].

## 2018-12-03 ENCOUNTER — Inpatient Hospital Stay (HOSPITAL_COMMUNITY): Payer: PPO

## 2018-12-03 LAB — COMPREHENSIVE METABOLIC PANEL
ALT: 40 U/L (ref 0–44)
AST: 22 U/L (ref 15–41)
Albumin: 2.6 g/dL — ABNORMAL LOW (ref 3.5–5.0)
Alkaline Phosphatase: 136 U/L — ABNORMAL HIGH (ref 38–126)
Anion gap: 6 (ref 5–15)
BUN: 30 mg/dL — ABNORMAL HIGH (ref 8–23)
CO2: 20 mmol/L — ABNORMAL LOW (ref 22–32)
Calcium: 8.2 mg/dL — ABNORMAL LOW (ref 8.9–10.3)
Chloride: 114 mmol/L — ABNORMAL HIGH (ref 98–111)
Creatinine, Ser: 1.68 mg/dL — ABNORMAL HIGH (ref 0.61–1.24)
GFR calc Af Amer: 45 mL/min — ABNORMAL LOW (ref >60)
GFR calc non Af Amer: 39 mL/min — ABNORMAL LOW (ref >60)
Glucose, Bld: 172 mg/dL — ABNORMAL HIGH (ref 70–99)
Potassium: 4.8 mmol/L (ref 3.5–5.1)
Sodium: 140 mmol/L (ref 135–145)
Total Bilirubin: 0.4 mg/dL (ref 0.3–1.2)
Total Protein: 5.8 g/dL — ABNORMAL LOW (ref 6.5–8.1)

## 2018-12-03 LAB — CBC WITH DIFFERENTIAL/PLATELET
Abs Immature Granulocytes: 0.03 10*3/uL (ref 0.00–0.07)
Basophils Absolute: 0 10*3/uL (ref 0.0–0.1)
Basophils Relative: 1 %
Eosinophils Absolute: 0.2 10*3/uL (ref 0.0–0.5)
Eosinophils Relative: 3 %
HCT: 32.8 % — ABNORMAL LOW (ref 39.0–52.0)
Hemoglobin: 9.8 g/dL — ABNORMAL LOW (ref 13.0–17.0)
Immature Granulocytes: 0 %
Lymphocytes Relative: 17 %
Lymphs Abs: 1.2 10*3/uL (ref 0.7–4.0)
MCH: 30.5 pg (ref 26.0–34.0)
MCHC: 29.9 g/dL — ABNORMAL LOW (ref 30.0–36.0)
MCV: 102.2 fL — ABNORMAL HIGH (ref 80.0–100.0)
Monocytes Absolute: 0.7 10*3/uL (ref 0.1–1.0)
Monocytes Relative: 9 %
Neutro Abs: 5.3 10*3/uL (ref 1.7–7.7)
Neutrophils Relative %: 70 %
Platelets: 146 10*3/uL — ABNORMAL LOW (ref 150–400)
RBC: 3.21 MIL/uL — ABNORMAL LOW (ref 4.22–5.81)
RDW: 14 % (ref 11.5–15.5)
WBC: 7.5 10*3/uL (ref 4.0–10.5)
nRBC: 0 % (ref 0.0–0.2)

## 2018-12-03 LAB — RAPID URINE DRUG SCREEN, HOSP PERFORMED
Amphetamines: NOT DETECTED
Barbiturates: NOT DETECTED
Benzodiazepines: POSITIVE — AB
Cocaine: NOT DETECTED
Opiates: NOT DETECTED
Tetrahydrocannabinol: NOT DETECTED

## 2018-12-03 LAB — GLUCOSE, CAPILLARY
Glucose-Capillary: 156 mg/dL — ABNORMAL HIGH (ref 70–99)
Glucose-Capillary: 138 mg/dL — ABNORMAL HIGH (ref 70–99)
Glucose-Capillary: 119 mg/dL — ABNORMAL HIGH (ref 70–99)
Glucose-Capillary: 44 mg/dL — CL (ref 70–99)
Glucose-Capillary: 156 mg/dL — ABNORMAL HIGH (ref 70–99)
Glucose-Capillary: 143 mg/dL — ABNORMAL HIGH (ref 70–99)

## 2018-12-03 LAB — URINE CULTURE: Culture: <10000 — AB

## 2018-12-03 LAB — HEMOGLOBIN A1C
Hgb A1c MFr Bld: 7.8 % — ABNORMAL HIGH (ref 4.8–5.6)
Mean Plasma Glucose: 177.16 mg/dL

## 2018-12-03 LAB — INTERLEUKIN-6, PLASMA: Interleukin-6, Plasma: 126.1 pg/mL — ABNORMAL HIGH (ref 0.0–12.2)

## 2018-12-03 LAB — C-REACTIVE PROTEIN: CRP: 6.6 mg/dL — ABNORMAL HIGH (ref <1.0)

## 2018-12-03 LAB — MAGNESIUM: Magnesium: 1.8 mg/dL (ref 1.7–2.4)

## 2018-12-03 MED ORDER — DEXTROMETHORPHAN POLISTIREX ER 30 MG/5ML PO SUER
60.0000 mg | Freq: Two times a day (BID) | ORAL | Status: DC
  Administered 2018-12-03 – 2018-12-05 (×5): 60 mg via ORAL
  Filled 2018-12-03 (×5): qty 10

## 2018-12-03 MED ORDER — GLUCERNA SHAKE PO LIQD
237.0000 mL | Freq: Three times a day (TID) | ORAL | Status: DC
  Administered 2018-12-03 – 2018-12-05 (×4): 237 mL via ORAL

## 2018-12-03 MED ORDER — INSULIN GLARGINE 100 UNIT/ML ~~LOC~~ SOLN
8.0000 [IU] | Freq: Every day | SUBCUTANEOUS | Status: DC
  Administered 2018-12-03 – 2018-12-04 (×2): 8 [IU] via SUBCUTANEOUS
  Filled 2018-12-03 (×4): qty 0.08

## 2018-12-03 MED ORDER — DOXYCYCLINE HYCLATE 100 MG PO TABS
100.0000 mg | ORAL_TABLET | Freq: Two times a day (BID) | ORAL | Status: DC
  Administered 2018-12-03 – 2018-12-05 (×5): 100 mg via ORAL
  Filled 2018-12-03 (×5): qty 1

## 2018-12-03 MED ORDER — IPRATROPIUM-ALBUTEROL 0.5-2.5 (3) MG/3ML IN SOLN
3.0000 mL | RESPIRATORY_TRACT | Status: DC
  Administered 2018-12-03 (×2): 3 mL via RESPIRATORY_TRACT
  Filled 2018-12-03 (×2): qty 3

## 2018-12-03 MED ORDER — DEXTROSE 50 % IV SOLN
INTRAVENOUS | Status: AC
  Administered 2018-12-03: 50 mL
  Filled 2018-12-03: qty 50

## 2018-12-03 NOTE — TOC Initial Note (Signed)
Transition of Care (TOC) - Initial/Assessment Note    Patient Details  Name: Mark SALOMON Sr. MRN: [id number] Date of Birth: [birth year]  Transition of Care [facility]) CM/SW Contact:    Ihor Gully, LCSW Phone Number: 12/03/2018, 6:45 PM  Clinical Narrative:                 Patient's wife provided history. At baseline patient ambulates independently (uses a cane only when is back is hurting), drives and is independent with ADLS. She stated that she is agreeable to HHPT and feels patient would benefit. She stated that she noticed that prior to this admission, patient was answering questions inappropriately. She indicated that her is very [facility] and that she dropped his hearing aid charger off today.  [place] agencies were discussed and she indicated that [place] would be fine.   Expected Discharge Plan: [place] Barriers to Discharge: No Barriers Identified   Patient Goals and CMS Choice   CMS Medicare.gov Compare Post Acute Care list provided to:: Other (Comment Required)(Wife, [name]) Choice offered to / list presented to : Spouse  Expected Discharge Plan and Services Expected Discharge Plan: [place] In-house Referral: Clinical Social Work Discharge Planning Services: CM Consult Post Acute Care Choice: [place] arrangements for the past 2 months: [place]: PT [place]: [place] ([place]tha) Date [place]: 12/03/18 Time [place]: [place] Representative spoke with at [place]: Vaughan Basta  Prior Living Arrangements/Services Living arrangements for the past 2 months: [place] Lives with:: Spouse Patient language and need for interpreter reviewed:: No Do you feel safe going back to the place where you live?: Yes      Need for Family Participation in Patient Care: Yes (Comment) Care giver support system in place?: Yes  (comment) Current home services: Home PT Criminal Activity/Legal Involvement Pertinent to Current Situation/Hospitalization: No - Comment as needed  Activities of Daily Living Home Assistive Devices/Equipment: None ADL Screening (condition at time of admission) Patient's cognitive ability adequate to safely complete daily activities?: No Is the patient deaf or have difficulty hearing?: No Does the patient have difficulty seeing, even when wearing glasses/contacts?: No Does the patient have difficulty concentrating, remembering, or making decisions?: Yes Patient able to express need for assistance with ADLs?: Yes Does the patient have difficulty dressing or bathing?: Yes Independently performs ADLs?: No Communication: Independent Dressing (OT): Needs assistance Is this a change from baseline?: Change from baseline, expected to last >3 days Grooming: Needs assistance Is this a change from baseline?: Change from baseline, expected to last >3 days Feeding: Needs assistance Is this a change from baseline?: Change from baseline, expected to last >3 days Bathing: Needs assistance Is this a change from baseline?: Change from baseline, expected to last >3 days Toileting: Needs assistance Is this a change from baseline?: Change from baseline, expected to last >3days In/Out Bed: Needs assistance Is this a change from baseline?: Change from baseline, expected to last >3 days Walks in Home: Independent, Needs assistance Does the patient have difficulty walking or climbing stairs?: Yes Weakness of Legs: Both Weakness of Arms/Hands: None  Permission Sought/Granted Permission sought to share information with : Family Supports          Permission granted to share info w Relationship:  spouse, [name], listed on chart      Emotional Assessment Appearance:: Appears stated age   Affect (typically observed): Unable to Assess Orientation: : Oriented to Self, Oriented to Place Alcohol /  Substance Use: Not Applicable Psych Involvement: No (comment)  Admission diagnosis:  Hyperkalemia [E87.5] AKI (acute kidney injury) ([place]) [N17.9] Fever, unspecified fever cause [R50.9] Patient Active Problem List   Diagnosis Date Noted  . Hyperkalemia 12/02/2018  . Fever, unknown origin 12/02/2018  . Altered mental state 12/02/2018  . Acute on chronic renal failure ([place]) 12/02/2018  . Pancreatic insufficiency 12/02/2018  . Wears hearing aid 12/02/2018  . HOH (hard of hearing) 12/02/2018  . DM type 2 with diabetic peripheral neuropathy ([place]) 09/15/2018  . Uncontrolled type 2 diabetes mellitus with hyperglycemia ([place]) 09/15/2018  . Peripheral edema 06/25/2018  . NAFLD (nonalcoholic fatty liver disease)   . Diabetic retinopathy ([place])   . ED (erectile dysfunction) 01/05/2014  . Shortness of breath 12/05/2011  . Coronary atherosclerosis of native coronary artery 12/05/2011  . Type 2 diabetes mellitus ([place]) 12/05/2011  . Essential hypertension, benign 12/05/2011  . Mixed hyperlipidemia 12/05/2011  . Malignant neoplasm of prostate ([place]) 06/26/2011   PCP:  [name], MD Pharmacy:   [facility], [address] AT [address] [place] [address] Phone: [phone] Fax: [phone]     Social Determinants of Health (SDOH) Interventions    Readmission Risk Interventions No flowsheet data found.

## 2018-12-03 NOTE — Evaluation (Signed)
Physical Therapy Evaluation Patient Details Name: Mark SCHEURING Sr. MRN: [id number] DOB: [birth year] Today's Date: 12/03/2018   History of Present Illness  Mark L Lecker Sr. is a 78 y.o. male with chronic back pain on oxycodone tablets at home, chronic kidney disease, type 2 diabetes mellitus on insulin, diabetic complications including retinopathy and nephropathy, essential hypertension, diastolic heart dysfunction, coronary artery disease who was brought into the emergency department by EMS with complaints of 1 day of increasing weakness, fever and altered mentation.  Patient had a fever greater than 101 at home according to his wife.  They apparently have been monitoring his blood glucose readings at home and they have been in the low 100 range.  He has not had any changes in his medications or activities.  He has had episodes of confusion associated with his home medications in the past.  He is followed by his primary care provider for that.  The patient apparently does continue to take oxycodone.  He had been taken off Valium by his primary care provider.  On arrival he was noted to be hypoxic with a pulse ox of 82% and was given supplemental oxygen with improvement to 95%.  The patient denied headache and visual changes.  There was no nuchal rigidity.  His chest x-ray was unremarkable for any acute findings.  His metabolic panel was positive for hyperkalemia with a sodium of 6.1.  His creatinine was noted to be 1.90.  His d-dimer was elevated at 0.75.  His white blood cell count was 8.6.  Hemoglobin was 11.0, platelet count 178.  Lactic acid 1.4.  Procalcitonin 0.10.  Blood glucose 132.  His urinalysis was unremarkable with no signs of infection found.  His EKG showed signs of tachycardia and hyperkalemia with abnormal R wave progression and slight peaked T waves.  Sed rate was 11.  He did have COVID-19 testing done in the ED that was negative.  Blood cultures obtained and urine cultures obtained.   The patient was started on broad-spectrum antibiotic therapy and admission was requested.  His vital signs have been stable with a heart rate of 102, blood pressure 97/66, initial temperature 102.2 improved to 100.1 after Tylenol administration.  The patient remained 99% oxygen saturation on 1 L nasal cannula.    Clinical Impression  Patient demonstrates slow labored movement requiring assistance to sit up at bedside, very unsteady on feet having to lean on nearby objects during transfer and taking steps without AD.  Patient much safer using RW and able to ambulate in hallway with slow labored cadence, no loss of balance and tolerated sitting up in chair after therapy.  Patient SpO2 above 95% throughout treatment and left on room air - RN aware.  Patient will benefit from continued physical therapy in hospital and recommended venue below to increase strength, balance, endurance for safe ADLs and gait.    Follow Up Recommendations Home health PT;Supervision for mobility/OOB;Supervision - Intermittent    Equipment Recommendations  None recommended by PT    Recommendations for Other Services       Precautions / Restrictions Precautions Precautions: Fall Restrictions Weight Bearing Restrictions: No      Mobility  Bed Mobility Overal bed mobility: Needs Assistance Bed Mobility: Supine to Sit     Supine to sit: Min assist     General bed mobility comments: slow labored movement  Transfers Overall transfer level: Needs assistance Equipment used: Rolling walker (2 wheeled);None Transfers: Sit to/from [employer] to Stand: [employer]  guard;Min assist Stand pivot transfers: Min guard;Min assist       General transfer comment: unsteady on feet having to lean on nearby objects for support, safer using RW  Ambulation/Gait Ambulation/Gait assistance: Min guard Gait Distance (Feet): 65 Feet Assistive device: Rolling walker (2 wheeled) Gait Pattern/deviations: Decreased  step length - right;Decreased step length - left;Decreased stride length Gait velocity: decreased   General Gait Details: slow labored cadence without loss of balance using RW, very unsteady having to lean on nearby objects for support when not using RW  Stairs            Wheelchair Mobility    Modified Rankin (Stroke Patients Only)       Balance Overall balance assessment: Needs assistance Sitting-balance support: Feet supported;No upper extremity supported Sitting balance-Leahy Scale: Good     Standing balance support: No upper extremity supported;During functional activity Standing balance-Leahy Scale: Poor Standing balance comment: fair/poor without AD, fair using RW                             Pertinent Vitals/Pain Pain Assessment: No/denies pain    Home Living Family/patient expects to be discharged to:: Private residence Living Arrangements: Spouse/significant other Available Help at Discharge: Family;Available 24 hours/day Type of Home: House Home Access: Ramped entrance     Home Layout: One level Home Equipment: Grab bars - tub/shower;Walker - 2 wheels;Cane - single point;Bedside commode;Shower seat      Prior Function Level of Independence: Independent               Hand Dominance        Extremity/Trunk Assessment   Upper Extremity Assessment Upper Extremity Assessment: Generalized weakness    Lower Extremity Assessment Lower Extremity Assessment: Generalized weakness    Cervical / Trunk Assessment Cervical / Trunk Assessment: Normal  Communication   Communication: No difficulties;HOH(No difficulties when wearing hearing aides)  Cognition Arousal/Alertness: Awake/alert Behavior During Therapy: WFL for tasks assessed/performed Overall Cognitive Status: Within Functional Limits for tasks assessed                                        General Comments      Exercises     Assessment/Plan    PT  Assessment Patient needs continued PT services  PT Problem List Decreased strength;Decreased activity tolerance;Decreased balance;Decreased mobility       PT Treatment Interventions Therapeutic exercise;Gait training;Stair training;Functional mobility training;Therapeutic activities;Patient/family education    PT Goals (Current goals can be found in the Care Plan section)  Acute Rehab PT Goals Patient Stated Goal: return home with spouse to assist PT Goal Formulation: With patient Time For Goal Achievement: 12/10/18 Potential to Achieve Goals: Good    Frequency Min 3X/week   Barriers to discharge        Co-evaluation               AM-PAC PT "6 Clicks" Mobility  Outcome Measure Help needed turning from your back to your side while in a flat bed without using bedrails?: A Little Help needed moving from lying on your back to sitting on the side of a flat bed without using bedrails?: A Lot Help needed moving to and from a bed to a chair (including a wheelchair)?: A Little Help needed standing up from a chair using your arms (e.g., wheelchair or bedside  chair)?: A Little Help needed to walk in hospital room?: A Little Help needed climbing 3-5 steps with a railing? : A Lot 6 Click Score: 16    End of Session   Activity Tolerance: Patient tolerated treatment well;Patient limited by fatigue Patient left: in chair;with call bell/phone within reach;with chair alarm set Nurse Communication: Mobility status PT Visit Diagnosis: Unsteadiness on feet (R26.81);Other abnormalities of gait and mobility (R26.89);Muscle weakness (generalized) (M62.81)    Time: 4431-5400 PT Time Calculation (min) (ACUTE ONLY): 35 min   Charges:   PT Evaluation $PT Eval Moderate Complexity: 1 Mod PT Treatments $Therapeutic Activity: 23-37 mins        11:17 AM, 12/03/18 Lonell Grandchild, MPT Physical Therapist with [facility] [phone] office [phone] mobile phone

## 2018-12-03 NOTE — Progress Notes (Signed)
Patient found standing in the doorway of his room.  IV had become dislodged.  Patient was assisted back to bed with assist of 2.  Bed alarm is on and siderails up x4.  Patient does not answer questions appropriately at this time.  Will continue to monitor.

## 2018-12-03 NOTE — Progress Notes (Signed)
PROGRESS NOTE    [name]  ANV:[id number]  DOB: 1941/03/10  DOA: 12/02/2018 PCP: [name], MD   Brief Admission Hx: 78 year old male admitted with altered mental status found to have a fever and was mildly dehydrated.  He did not have sepsis or SIRS symptoms.   MDM/Assessment & Plan:   1. Viral URI -COVID testing has been negative.  Repeat chest x-ray does not show any signs of pneumonia.  He is coughing and wheezing and likely has a viral URI.  He has been started on nebulizer treatments for wheezing and coughing.  Cough syrup is been ordered.  His mental status has improved. 2. Possible dysphagia-patient started coughing after eating breakfast and aspiration precautions have been ordered.  I have asked for an SLP evaluation.  Elevate head of bed. 3. Altered mental state-likely was related to fever-he seems to have improved to his baseline and he is oriented x3 at this time.  His fever has resolved. 4. Fever- his temperature has come down with supportive therapy.  Suspect this was related to upper respiratory illness.  Continue Tylenol as needed. 5. Hyperkalemia-this has been treated and now resolved.  His losartan has been discontinued.  He received Lokelma and fluid hydration for treatment. 6. Uncontrolled type 2 diabetes mellitus with hyperglycemia and renal with neurological complications-patient is being treated with sliding scale coverage in addition to Lantus and prandial insulin as ordered.  Continue to monitor CBGs closely. 7. Essential hypertension- his blood pressure seems to be controlled at this time we will continue to follow. 8. Pancreatic insufficiency-I spoke with wife and this is chronic for him he has diarrhea from this and is treated with enzymes according to his wife.  We have resumed his home enzymes. 9. Acute on chronic renal failure-he has had some improvement with gentle hydration. 10. Elevated d-dimer-VQ scan negative for PE. 11. Mixed  hyperlipidemia-resumed home pravastatin nightly.  DVT prophylaxis: Lovenox Code Status: full  Family Communication: wife telephoned Disposition Plan: home tomorrow if stable with HHPT  Consultants:  n/a  Procedures:  n/a   Subjective: Patient reports that he started choking when he was eating breakfast.  He otherwise says he is feeling a little better.     Objective: Vitals:   12/02/18 1446 12/02/18 2008 12/02/18 2338 12/03/18 0528  BP: 101/60 129/62 116/64 109/60  Pulse: 93 100 85 83  Resp: 18 18  17   Temp: 98.2 F (36.8 C) 99.1 F (37.3 C)  98.7 F (37.1 C)  TempSrc: Oral Oral  Oral  SpO2: 98% 97%  95%  Weight: 95.4 kg     Height:        Intake/Output Summary (Last 24 hours) at 12/03/2018 1125 Last data filed at 12/03/2018 0841 Gross per 24 hour  Intake 1725.53 ml  Output 1050 ml  Net 675.53 ml   Filed Weights   12/02/18 0853 12/02/18 1446  Weight: 93 kg 95.4 kg     REVIEW OF SYSTEMS  As per history otherwise all reviewed and reported negative  Exam:  General exam: elderly male, awake, alert, NAD.  Hard of hearing. Respiratory system: bilateral wheezing. No increased work of breathing. Cardiovascular system: S1 & S2 heard. No JVD, murmurs, gallops, clicks or pedal edema. Gastrointestinal system: Abdomen is nondistended, soft and nontender. Normal bowel sounds heard. Central nervous system: Alert and oriented. No focal neurological deficits. Extremities: no CCE.  Data Reviewed: Basic Metabolic Panel: Recent Labs  Lab 12/02/18 0927 12/03/18 0434  NA 138  140  K 6.1* 4.8  CL 111 114*  CO2 19* 20*  GLUCOSE 132* 172*  BUN 32* 30*  CREATININE 1.90* 1.68*  CALCIUM 8.4* 8.2*  MG  --  1.8   Liver Function Tests: Recent Labs  Lab 12/02/18 0927 12/03/18 0434  AST 26 22  ALT 48* 40  ALKPHOS 161* 136*  BILITOT 0.4 0.4  PROT 6.3* 5.8*  ALBUMIN 2.9* 2.6*   No results for input(s): LIPASE, AMYLASE in the last 168 hours. No results for input(s):  AMMONIA in the last 168 hours. CBC: Recent Labs  Lab 12/02/18 1023 12/03/18 0434  WBC 8.6 7.5  NEUTROABS 6.3 5.3  HGB 11.0* 9.8*  HCT 36.5* 32.8*  MCV 99.7 102.2*  PLT 178 146*   Cardiac Enzymes: No results for input(s): CKTOTAL, CKMB, CKMBINDEX, TROPONINI in the last 168 hours. CBG (last 3)  Recent Labs    12/03/18 0326 12/03/18 0739 12/03/18 1110  GLUCAP 156* 138* 119*   Recent Results (from the past 240 hour(s))  Culture, blood (Routine X 2) w Reflex to ID Panel     Status: None (Preliminary result)   Collection Time: 12/02/18  9:08 AM  Result Value Ref Range Status   Specimen Description BLOOD LEFT FOREARM  Final   Special Requests   Final    BOTTLES DRAWN AEROBIC ONLY Blood Culture results may not be optimal due to an inadequate volume of blood received in culture bottles   Culture   Final    NO GROWTH < 24 HOURS Performed at [facility], [address] [place], [place] [address]    Report Status PENDING  Incomplete  SARS Coronavirus 2 [facility] order, Performed in [place] hospital lab)     Status: None   Collection Time: 12/02/18  9:27 AM  Result Value Ref Range Status   SARS Coronavirus 2 NEGATIVE NEGATIVE Final    Comment: (NOTE) If result is NEGATIVE SARS-CoV-2 target nucleic acids are NOT DETECTED. The SARS-CoV-2 RNA is generally detectable in upper and lower  respiratory specimens during the acute phase of infection. The lowest  concentration of SARS-CoV-2 viral copies this assay can detect is 250  copies / mL. A negative result does not preclude SARS-CoV-2 infection  and should not be used as the sole basis for treatment or other  patient management decisions.  A negative result may occur with  improper specimen collection / handling, submission of specimen other  than nasopharyngeal swab, presence of viral mutation(s) within the  areas targeted by this assay, and inadequate number of viral copies  (<250 copies / mL). A negative result must be  combined with clinical  observations, patient history, and epidemiological information. If result is POSITIVE SARS-CoV-2 target nucleic acids are DETECTED. The SARS-CoV-2 RNA is generally detectable in upper and lower  respiratory specimens dur ing the acute phase of infection.  Positive  results are indicative of active infection with SARS-CoV-2.  Clinical  correlation with patient history and other diagnostic information is  necessary to determine patient infection status.  Positive results do  not rule out bacterial infection or co-infection with other viruses. If result is PRESUMPTIVE POSTIVE SARS-CoV-2 nucleic acids MAY BE PRESENT.   A presumptive positive result was obtained on the submitted specimen  and confirmed on repeat testing.  While 2019 novel coronavirus  (SARS-CoV-2) nucleic acids may be present in the submitted sample  additional confirmatory testing may be necessary for epidemiological  and / or clinical management purposes  to differentiate  between  SARS-CoV-2 and other Sarbecovirus currently known to infect humans.  If clinically indicated additional testing with an alternate test  methodology [phone]) is advised. The SARS-CoV-2 RNA is generally  detectable in upper and lower respiratory sp ecimens during the acute  phase of infection. The expected result is Negative. Fact Sheet for Patients:  StrictlyIdeas.no Fact Sheet for Healthcare Providers: BankingDealers.co.za This test is not yet approved or cleared by the [place] FDA and has been authorized for detection and/or diagnosis of SARS-CoV-2 by FDA under an Emergency Use Authorization (EUA).  This EUA will remain in effect (meaning this test can be used) for the duration of the COVID-19 declaration under Section 564(b)(1) of the Act, 21 U.S.C. section 360bbb-3(b)(1), unless the authorization is terminated or revoked sooner. Performed at [facility],  [address], [place], [place] [address]   Culture, blood (x 2)     Status: None (Preliminary result)   Collection Time: 12/02/18  3:22 PM  Result Value Ref Range Status   Specimen Description BLOOD LEFT WRIST  Final   Special Requests   Final    BOTTLES DRAWN AEROBIC AND ANAEROBIC Blood Culture adequate volume   Culture   Final    NO GROWTH < 24 HOURS Performed at [facility], [address] [place], [place] [address]    Report Status PENDING  Incomplete     Studies: Ct Chest Wo Contrast  Result Date: 12/02/2018 CLINICAL DATA:  Cough, fever, and weakness. EXAM: CT CHEST WITHOUT CONTRAST TECHNIQUE: Multidetector CT imaging of the chest was performed following the standard protocol without IV contrast. COMPARISON:  Chest x-ray from same day. High-resolution chest CT dated October 10, 2011. FINDINGS: Cardiovascular: Normal heart size. No pericardial effusion. No thoracic aortic aneurysm. Coronary, aortic arch, and branch vessel atherosclerotic vascular disease. Normal caliber pulmonary arteries. Mediastinum/Nodes: No enlarged mediastinal or axillary lymph nodes. Thyroid gland, trachea, and esophagus demonstrate no significant findings. Lungs/Pleura: Trace bilateral pleural effusions. Chronic atelectasis at the right lung base due to elevation of the right hemidiaphragm. Minimal subsegmental atelectasis in the left lower lobe. No focal consolidation, pleural effusion, or pneumothorax. No suspicious pulmonary nodule. Upper Abdomen: No acute abnormality. Hepatic steatosis. Prior cholecystectomy. Musculoskeletal: Bilateral gynecomastia. 1.5 cm nodule in the anterior lower chest wall just deep to the skin surface, likely a sebaceous cyst. No acute or significant osseous findings. Unchanged small bone island in the T1 vertebral body. IMPRESSION: 1. Trace bilateral pleural effusions. 2. Chronic right lung base atelectasis due to elevated right hemidiaphragm. 3. Hepatic steatosis. 4.  Aortic atherosclerosis  (ICD10-I70.0). Electronically Signed   By: Titus Dubin M.D.   On: 12/02/2018 13:20   Nm Pulmonary Perfusion  Result Date: 12/02/2018 CLINICAL DATA:  78 year old male with cough and shortness of breath. Positive D-dimer. EXAM: NUCLEAR MEDICINE PERFUSION SCAN TECHNIQUE: Perfusion images were obtained in multiple projections after intravenous injection of radiopharmaceutical. Ventilation images were not obtained in accordance with pandemic protocol. RADIOPHARMACEUTICALS:  4.2 mCi [id number]-IV COMPARISON:  Noncontrast chest CT earlier today. FINDINGS: Ventilation: Not performed. Perfusion: Normal, homogeneous perfusion radiotracer activity throughout both lungs. No perfusion defect identified. IMPRESSION: Homogeneous pulmonary perfusion, no evidence of pulmonary embolus. Electronically Signed   By: HGenevie AnnM.D.   On: 12/02/2018 20:16   Portable Chest 1 View  Result Date: 12/03/2018 CLINICAL DATA:  Sepsis.  Cough. EXAM: PORTABLE CHEST 1 VIEW COMPARISON:  One-view chest x-ray and CT chest 12/02/2018 FINDINGS: The heart is enlarged, exaggerated by low lung volumes. Chronic elevation  of the right hemidiaphragm is again noted. There is no edema or effusion. No significant airspace consolidation is present. Aeration is slightly improved. Lung volumes remain low. IMPRESSION: 1. Slight improved aeration with persistent low lung volumes. 2. Chronic elevation of the right hemidiaphragm. Electronically Signed   By: San Morelle M.D.   On: 12/03/2018 07:01   Dg Chest Portable 1 View  Result Date: 12/02/2018 CLINICAL DATA:  Pt brought in by EMS due to weakness, ams and fever. Wife reported to EMS that pt had been weak for several days, altered last nigh (not acting himself), not walking well and fever 101.4. Noted to have a cough CBG 134 bp 140/80 s.*comment was truncated*fever, cough EXAM: PORTABLE CHEST 1 VIEW COMPARISON:  Radiograph [id number] FINDINGS: Stable cardiac silhouette. Low lung volumes. RIGHT pleural  effusion unchanged. No overt pulmonary edema. IMPRESSION: No significant change. Low lung volumes and moderate RIGHT effusion. Electronically Signed   By: Suzy Bouchard M.D.   On: 12/02/2018 10:14     Scheduled Meds:  amLODipine  10 mg Oral Daily   aspirin EC  81 mg Oral QHS   carvedilol  25 mg Oral BID WC   dextromethorphan  60 mg Oral BID   doxazosin  8 mg Oral Daily   doxycycline  100 mg Oral Q12H   insulin aspart  0-5 Units Subcutaneous QHS   insulin aspart  0-9 Units Subcutaneous TID WC   insulin aspart  6 Units Subcutaneous TID WC   insulin glargine  15 Units Subcutaneous QHS   ipratropium-albuterol  3 mL Nebulization Q4H   lipase/protease/amylase  36,000 Units Oral TID AC   multivitamin with minerals  1 tablet Oral Daily   pravastatin  10 mg Oral q1800   Continuous Infusions:  sodium chloride 10 mL/hr at 12/03/18 6812    Active Problems:   Coronary atherosclerosis of native coronary artery   Essential hypertension, benign   Mixed hyperlipidemia   NAFLD (nonalcoholic fatty liver disease)   DM type 2 with diabetic peripheral neuropathy ([place])   Uncontrolled type 2 diabetes mellitus with hyperglycemia (HCC)   Hyperkalemia   Fever, unknown origin   Altered mental state   Acute on chronic renal failure [facility])   Pancreatic insufficiency   Wears hearing aid   HOH (hard of hearing)   Time spent:   Irwin Brakeman, MD [facility] 12/03/2018, 11:25 AM    LOS: 1 day  How to contact the [facility] Attending or Consulting provider [place] or covering provider during after hours [place], for this patient?  1. Check the care team in [facility] and look for a) attending/consulting TR[facility] provider listed and b) the [facility] team listed 2. Log into [email or web address] and use [place]'s universal password to access. If you do not have the password, please contact the hospital operator. 3. Locate the [facility] provider you are looking for under [facility] and page to a number  that you can be directly reached. 4. If you still have difficulty reaching the provider, please page the [facility] (Director on Call) for the Hospitalists listed on amion for assistance.

## 2018-12-03 NOTE — Progress Notes (Signed)
Initial Nutrition Assessment  RD working remotely.   DOCUMENTATION CODES:   Obesity unspecified   INTERVENTION:  Ensure Enlive po BID, each supplement provides 350 kcal and 20 grams of protein q hs  Nutrition services to obtain meal preferences daily  NUTRITION DIAGNOSIS:   Increased nutrient needs related to chronic illness, acute illness, poor appetite(NAFLD and acute URI) as evidenced by estimated needs, meal completion < 50%(dehydrated on admission).  GOAL:  Patient will meet greater than or equal to 90% of their needs   MONITOR:  Supplement acceptance, PO intake, Labs, Weight trends   REASON FOR ASSESSMENT:  Consult Assessment of nutrition requirement/status   ASSESSMENT: Patient is an obese 78 yo male with history of diabetes-type 2, hypertension,chronic kidney disease, pancreatic insufficency and  NAFLD, CAD. Hearing impaired and chronic back pain. Patient presents with complaint of increased weakness, mild dehydration and altered mental status. Febrile with URI at admission.  RD attempted to speak with patient but unfortunately he was unable to hear well enough to engage in conversation. Also unable to reach [place] at this time. According to chart review- Meal documentation: 5% of breakfast and 50% of lunch consumed. Poor oral intake. He takes pancreatic enzymes with each meal. Expect patient is malnourished based on acute/chronicdisease and poor oral intake but unable to clearly outline specifics at this time. RD will continue to follow.  Weight history suggest a gain of 7.3% since early January- his weight is up from 88.9 kg- 95.4 kg.  Medications reviewed and include: Insulin, doxycycline, lantus, creon, MVI   Labs: Hgb. 9.8 (L), Alb. 2.6 (L), A1C-7.8% mildly elevated. BMP Latest Ref Rng & Units 12/03/2018 12/02/2018 08/11/2018  Glucose 70 - 99 mg/dL 172(H) 132(H) 336(H)  BUN 8 - 23 mg/dL 30(H) 32(H) 23  Creatinine 0.61 - 1.24 mg/dL 1.68(H) 1.90(H) 1.38(H)  BUN/Creat  Ratio 6 - 22 (calc) - - 17  Sodium 135 - 145 mmol/L 140 138 136  Potassium 3.5 - 5.1 mmol/L 4.8 6.1(H) 4.7  Chloride 98 - 111 mmol/L 114(H) 111 104  CO2 22 - 32 mmol/L 20(L) 19(L) 23  Calcium 8.9 - 10.3 mg/dL 8.2(L) 8.4(L) 8.2(L)     NUTRITION - FOCUSED PHYSICAL EXAM: Unable to complete Nutrition-Focused physical exam at this time.    Diet Order:   Diet Order            Diet heart healthy/carb modified Room service appropriate? Yes; Fluid consistency: Thin  Diet effective now             EDUCATION NEEDS: unable to address at this time due to pt hearing impairment.   Not appropriate for education at this time(hearing impairment-RD working remotely)   Skin:  Skin Assessment: Reviewed RN Assessment  Last BM:  unknown  Height:   Ht Readings from Last 1 Encounters:  12/02/18 5' 9"  (1.753 m)    Weight:   Wt Readings from Last 1 Encounters:  12/02/18 95.4 kg    Ideal Body Weight:  73 kg  BMI:  Body mass index is 31.06 kg/m.  Estimated Nutritional Needs:   Kcal:  0962-8366 (MSJ x AF 1.2-1.3)  Protein:  88-102 gr   Fluid:  per MD  Colman Cater MS,RD,CSG,LDN Office: [phone] Pager: [phone]

## 2018-12-03 NOTE — Plan of Care (Signed)
  Problem: Acute Rehab PT Goals(only PT should resolve) Goal: Pt Will Go Supine/Side To Sit Outcome: Progressing Flowsheets (Taken 12/03/2018 1118) Pt will go Supine/Side to Sit: with min guard assist Goal: Patient Will Transfer Sit To/From Stand Outcome: Progressing Flowsheets (Taken 12/03/2018 1118) Patient will transfer sit to/from stand: with supervision Goal: Pt Will Transfer Bed To Chair/Chair To Bed Outcome: Progressing Flowsheets (Taken 12/03/2018 1118) Pt will Transfer Bed to Chair/Chair to Bed: with supervision Goal: Pt Will Ambulate Outcome: Progressing Flowsheets (Taken 12/03/2018 1118) Pt will Ambulate: 100 feet; with supervision; with rolling walker   11:19 AM, 12/03/18 Lonell Grandchild, MPT Physical Therapist with [facility] [phone] office [phone] mobile phone

## 2018-12-04 ENCOUNTER — Inpatient Hospital Stay (HOSPITAL_COMMUNITY): Payer: PPO

## 2018-12-04 LAB — CBC WITH DIFFERENTIAL/PLATELET
Abs Immature Granulocytes: 0.02 10*3/uL (ref 0.00–0.07)
Basophils Absolute: 0 10*3/uL (ref 0.0–0.1)
Basophils Relative: 1 %
Eosinophils Absolute: 0.2 10*3/uL (ref 0.0–0.5)
Eosinophils Relative: 4 %
HCT: 32 % — ABNORMAL LOW (ref 39.0–52.0)
Hemoglobin: 9.7 g/dL — ABNORMAL LOW (ref 13.0–17.0)
Immature Granulocytes: 0 %
Lymphocytes Relative: 17 %
Lymphs Abs: 1.1 10*3/uL (ref 0.7–4.0)
MCH: 30.2 pg (ref 26.0–34.0)
MCHC: 30.3 g/dL (ref 30.0–36.0)
MCV: 99.7 fL (ref 80.0–100.0)
Monocytes Absolute: 0.6 10*3/uL (ref 0.1–1.0)
Monocytes Relative: 9 %
Neutro Abs: 4.6 10*3/uL (ref 1.7–7.7)
Neutrophils Relative %: 69 %
Platelets: 137 10*3/uL — ABNORMAL LOW (ref 150–400)
RBC: 3.21 MIL/uL — ABNORMAL LOW (ref 4.22–5.81)
RDW: 13.5 % (ref 11.5–15.5)
WBC: 6.6 10*3/uL (ref 4.0–10.5)
nRBC: 0 % (ref 0.0–0.2)

## 2018-12-04 LAB — RENAL FUNCTION PANEL
Albumin: 2.6 g/dL — ABNORMAL LOW (ref 3.5–5.0)
Anion gap: 7 (ref 5–15)
BUN: 23 mg/dL (ref 8–23)
CO2: 21 mmol/L — ABNORMAL LOW (ref 22–32)
Calcium: 8 mg/dL — ABNORMAL LOW (ref 8.9–10.3)
Chloride: 110 mmol/L (ref 98–111)
Creatinine, Ser: 1.41 mg/dL — ABNORMAL HIGH (ref 0.61–1.24)
GFR calc Af Amer: 55 mL/min — ABNORMAL LOW (ref >60)
GFR calc non Af Amer: 48 mL/min — ABNORMAL LOW (ref >60)
Glucose, Bld: 196 mg/dL — ABNORMAL HIGH (ref 70–99)
Phosphorus: 2.1 mg/dL — ABNORMAL LOW (ref 2.5–4.6)
Potassium: 4 mmol/L (ref 3.5–5.1)
Sodium: 138 mmol/L (ref 135–145)

## 2018-12-04 LAB — GLUCOSE, CAPILLARY
Glucose-Capillary: 179 mg/dL — ABNORMAL HIGH (ref 70–99)
Glucose-Capillary: 183 mg/dL — ABNORMAL HIGH (ref 70–99)
Glucose-Capillary: 244 mg/dL — ABNORMAL HIGH (ref 70–99)
Glucose-Capillary: 176 mg/dL — ABNORMAL HIGH (ref 70–99)
Glucose-Capillary: 186 mg/dL — ABNORMAL HIGH (ref 70–99)

## 2018-12-04 LAB — CREATININE, SERUM
Creatinine, Ser: 1.39 mg/dL — ABNORMAL HIGH (ref 0.61–1.24)
GFR calc Af Amer: 56 mL/min — ABNORMAL LOW (ref >60)
GFR calc non Af Amer: 49 mL/min — ABNORMAL LOW (ref >60)

## 2018-12-04 LAB — BRAIN NATRIURETIC PEPTIDE: B Natriuretic Peptide: 316 pg/mL — ABNORMAL HIGH (ref 0.0–100.0)

## 2018-12-04 LAB — MAGNESIUM: Magnesium: 1.6 mg/dL — ABNORMAL LOW (ref 1.7–2.4)

## 2018-12-04 MED ORDER — MAGNESIUM SULFATE IN D5W 1-5 GM/100ML-% IV SOLN
1.0000 g | Freq: Once | INTRAVENOUS | Status: AC
  Administered 2018-12-04: 1 g via INTRAVENOUS
  Filled 2018-12-04: qty 100

## 2018-12-04 MED ORDER — ENOXAPARIN SODIUM 30 MG/0.3ML ~~LOC~~ SOLN
30.0000 mg | SUBCUTANEOUS | Status: DC
  Administered 2018-12-04 – 2018-12-05 (×2): 30 mg via SUBCUTANEOUS
  Filled 2018-12-04 (×2): qty 0.3

## 2018-12-04 MED ORDER — FUROSEMIDE 10 MG/ML IJ SOLN
40.0000 mg | Freq: Once | INTRAMUSCULAR | Status: AC
  Administered 2018-12-04: 40 mg via INTRAVENOUS
  Filled 2018-12-04: qty 4

## 2018-12-04 NOTE — Progress Notes (Signed)
Spoke with patient's wife, Carlyon Shadow, who indicated that patient fell off a ladder one month ago and hit his head at the time. Passed on the information to Dr. Wynetta Emery and will continue to monitor patient.

## 2018-12-04 NOTE — Progress Notes (Signed)
Pt walked from bathroom back to bed with one assist. Pt stated he is having cramping to right side of chest. MD made aware.

## 2018-12-04 NOTE — Progress Notes (Signed)
Physical Therapy Treatment Patient Details Name: [name] Sr. MRN: [id number] DOB: 1940-10-24 Today's Date: 12/04/2018    History of Present Illness Mark L Magner Sr. is a 78 y.o. male with chronic back pain on oxycodone tablets at home, chronic kidney disease, type 2 diabetes mellitus on insulin, diabetic complications including retinopathy and nephropathy, essential hypertension, diastolic heart dysfunction, coronary artery disease who was brought into the emergency department by EMS with complaints of 1 day of increasing weakness, fever and altered mentation.  Patient had a fever greater than 101 at home according to his wife.  They apparently have been monitoring his blood glucose readings at home and they have been in the low 100 range.  He has not had any changes in his medications or activities.  He has had episodes of confusion associated with his home medications in the past.  He is followed by his primary care provider for that.  The patient apparently does continue to take oxycodone.  He had been taken off Valium by his primary care provider.  On arrival he was noted to be hypoxic with a pulse ox of 82% and was given supplemental oxygen with improvement to 95%.  The patient denied headache and visual changes.  There was no nuchal rigidity.  His chest x-ray was unremarkable for any acute findings.  His metabolic panel was positive for hyperkalemia with a sodium of 6.1.  His creatinine was noted to be 1.90.  His d-dimer was elevated at 0.75.  His white blood cell count was 8.6.  Hemoglobin was 11.0, platelet count 178.  Lactic acid 1.4.  Procalcitonin 0.10.  Blood glucose 132.  His urinalysis was unremarkable with no signs of infection found.  His EKG showed signs of tachycardia and hyperkalemia with abnormal R wave progression and slight peaked T waves.  Sed rate was 11.  He did have COVID-19 testing done in the ED that was negative.  Blood cultures obtained and urine cultures obtained.   The patient was started on broad-spectrum antibiotic therapy and admission was requested.  His vital signs have been stable with a heart rate of 102, blood pressure 97/66, initial temperature 102.2 improved to 100.1 after Tylenol administration.  The patient remained 99% oxygen saturation on 1 L nasal cannula.    PT Comments    Pt with increased confusing today, RN aware of status.  Pt unable to tell name, DOB, or awareness he is in hospital.  Pt unsteady upon standing without use of RW.  Used RW for gait training with no LOB with AD, increased veering to the Rt and decreased cadence upon return to room due to fatigue.  Therapist had to turn RW to enter room as pt stood outside.  Multiple times had to request pt to return mask to face as on droplet precautions, one even tried to cover eyes with mask.  Good O2 saturation on room air with 97-98% through session.  Pt returned to bed due to confusion for safety, RN aware of status.    Follow Up Recommendations  Home health PT;Supervision for mobility/OOB;Supervision - Intermittent     Equipment Recommendations  None recommended by PT    Recommendations for Other Services       Precautions / Restrictions Precautions Precautions: Fall Restrictions Weight Bearing Restrictions: No    Mobility  Bed Mobility   Bed Mobility: Sit to Supine       Sit to supine: Min guard   General bed mobility comments: Pt sitting on EOB  at entrance.  slow labored movement,cueing for scooting towards HOB, mechanics getting sit to supine  Transfers Overall transfer level: Needs assistance Equipment used: Rolling walker (2 wheeled);None Transfers: Sit to/from Stand Sit to Stand: Min guard         General transfer comment: unsteady on feet having to lean on nearby objects for support, safer using RW  Ambulation/Gait Ambulation/Gait assistance: Min guard Gait Distance (Feet): 65 Feet Assistive device: Rolling walker (2 wheeled) Gait  Pattern/deviations: Decreased step length - right;Decreased step length - left;Decreased stride length Gait velocity: decreased   General Gait Details: slow labored cadence, no LOB with use of RW.  Increased veering upon return to room and slower cadence due to fatigue and confusion   Stairs             Wheelchair Mobility    Modified Rankin (Stroke Patients Only)       Balance                                            Cognition Arousal/Alertness: Awake/alert Behavior During Therapy: Flat affect Overall Cognitive Status: History of cognitive impairments - at baseline Area of Impairment: Orientation;Memory;Safety/judgement;Following commands                 Orientation Level: Disoriented to;Person;Place     Following Commands: Follows one step commands inconsistently Safety/Judgement: Decreased awareness of safety     General Comments: Pt very confused today.  Unable to state name, DOB or awareness of location.      Exercises      General Comments        Pertinent Vitals/Pain Pain Assessment: No/denies pain    Home Living                      Prior Function            PT Goals (current goals can now be found in the care plan section)      Frequency    Min 3X/week      PT Plan Current plan remains appropriate    Co-evaluation              AM-PAC PT "6 Clicks" Mobility   Outcome Measure  Help needed turning from your back to your side while in a flat bed without using bedrails?: A Little Help needed moving from lying on your back to sitting on the side of a flat bed without using bedrails?: A Lot Help needed moving to and from a bed to a chair (including a wheelchair)?: A Little Help needed standing up from a chair using your arms (e.g., wheelchair or bedside chair)?: A Little Help needed to walk in hospital room?: A Little Help needed climbing 3-5 steps with a railing? : A Lot 6 Click Score: 16     End of Session Equipment Utilized During Treatment: Gait belt Activity Tolerance: Patient tolerated treatment well;Patient limited by fatigue Patient left: in bed;with bed alarm set;with call bell/phone within reach Nurse Communication: Mobility status PT Visit Diagnosis: Unsteadiness on feet (R26.81);Other abnormalities of gait and mobility (R26.89);Muscle weakness (generalized) (M62.81)     Time: 5732-2025 PT Time Calculation (min) (ACUTE ONLY): 20 min  Charges:  $Therapeutic Activity: 8-22 mins                     Ihor Austin,  LPTA; CBIS [phone]   Aldona Lento 12/04/2018, 9:59 AM

## 2018-12-04 NOTE — Progress Notes (Signed)
MD gave verbal order for stat 12 lead EKG. MD placed order for BNP to be drawn once.

## 2018-12-04 NOTE — Progress Notes (Signed)
PROGRESS NOTE  [name]  URK:[id number]  DOB: 06/11/1941  DOA: 12/02/2018 PCP: [name], MD  Brief Admission Hx: 78 y/o male admitted with altered mental status, fever and dehydration.   MDM/Assessment & Plan:   1. Viral URI -COVID testing has been negative.  Repeat chest x-ray does not show any signs of pneumonia.  He is coughing and wheezing and likely has a viral URI.  He has been started on nebulizer treatments for wheezing and coughing.  Cough syrup is been ordered.   2. Acute delirium - patient having some sundowning.  Pt had CT head no acute findings.  3. Altered mental state-likely was related to fever-he seems to have improved to his baseline and he is oriented x3 at this time.  His fever has resolved. 4. Fever- his temperature has come down with supportive therapy.  Suspect this was related to upper respiratory illness.  Continue Tylenol as needed. 5. Hyperkalemia-this has been treated and now resolved.  His losartan has been discontinued.  He received Lokelma and fluid hydration for treatment. 6. Uncontrolled type 2 diabetes mellitus with hyperglycemia and renal with neurological complications-patient is being treated with sliding scale coverage in addition to Lantus and prandial insulin as ordered.  Continue to monitor CBGs closely. 7. Essential hypertension- his blood pressure seems to be controlled at this time we will continue to follow. 8. Pancreatic insufficiency-I spoke with wife and this is chronic for him he has diarrhea from this and is treated with enzymes according to his wife.  We have resumed his home enzymes. 9. Acute on chronic renal failure-he has had some improvement with gentle hydration. 10. Elevated d-dimer-VQ scan negative for PE. 11. Mixed hyperlipidemia-resumed home pravastatin nightly.  DVT prophylaxis: Lovenox Code Status: full  Family Communication: wife telephoned Disposition Plan: home tomorrow if stable with HHPT  Consultants:   n/a  Procedures:  n/a  Subjective: Patient confused today.     Objective: Vitals:   12/03/18 2230 12/04/18 0143 12/04/18 0159 12/04/18 0545  BP: 138/80 (!) 158/68  (!) 152/81  Pulse: 77 83  83  Resp: 18 (!) 22  18  Temp: 98.6 F (37 C) (!) 97.5 F (36.4 C)  98.3 F (36.8 C)  TempSrc: Oral Oral  Oral  SpO2: 95% 100% 98% 97%  Weight:      Height:        Intake/Output Summary (Last 24 hours) at 12/04/2018 1427 Last data filed at 12/04/2018 1311 Gross per 24 hour  Intake 1124.33 ml  Output 975 ml  Net 149.33 ml   Filed Weights   12/02/18 0853 12/02/18 1446  Weight: 93 kg 95.4 kg   REVIEW OF SYSTEMS  As per history otherwise all reviewed and reported negative  Exam:  General exam: elderly male, awake, alert, NAD.  Hard of hearing. Respiratory system: bilateral wheezing. No increased work of breathing. Cardiovascular system: S1 & S2 heard. No JVD, murmurs, gallops, clicks or pedal edema. Gastrointestinal system: Abdomen is nondistended, soft and nontender. Normal bowel sounds heard. Central nervous system: Alert and oriented. No focal neurological deficits. Extremities: no CCE.  Data Reviewed: Basic Metabolic Panel: Recent Labs  Lab 12/02/18 0927 12/03/18 0434 12/04/18 0252  NA 138 140 138  K 6.1* 4.8 4.0  CL 111 114* 110  CO2 19* 20* 21*  GLUCOSE 132* 172* 196*  BUN 32* 30* 23  CREATININE 1.90* 1.68* 1.41*  1.39*  CALCIUM 8.4* 8.2* 8.0*  MG  --  1.8 1.6*  PHOS  --   --  2.1*   Liver Function Tests: Recent Labs  Lab 12/02/18 0927 12/03/18 0434 12/04/18 0252  AST 26 22  --   ALT 48* 40  --   ALKPHOS 161* 136*  --   BILITOT 0.4 0.4  --   PROT 6.3* 5.8*  --   ALBUMIN 2.9* 2.6* 2.6*   No results for input(s): LIPASE, AMYLASE in the last 168 hours. No results for input(s): AMMONIA in the last 168 hours. CBC: Recent Labs  Lab 12/02/18 1023 12/03/18 0434 12/04/18 0252  WBC 8.6 7.5 6.6  NEUTROABS 6.3 5.3 4.6  HGB 11.0* 9.8* 9.7*  HCT 36.5*  32.8* 32.0*  MCV 99.7 102.2* 99.7  PLT 178 146* 137*   Cardiac Enzymes: No results for input(s): CKTOTAL, CKMB, CKMBINDEX, TROPONINI in the last 168 hours. CBG (last 3)  Recent Labs    12/04/18 0302 12/04/18 0726 12/04/18 1252  GLUCAP 179* 183* 244*   Recent Results (from the past 240 hour(s))  Culture, blood (Routine X 2) w Reflex to ID Panel     Status: None (Preliminary result)   Collection Time: 12/02/18  9:08 AM  Result Value Ref Range Status   Specimen Description BLOOD LEFT FOREARM  Final   Special Requests   Final    BOTTLES DRAWN AEROBIC ONLY Blood Culture results may not be optimal due to an inadequate volume of blood received in culture bottles   Culture   Final    NO GROWTH < 24 HOURS Performed at [facility], [address] [place], [place] [address]    Report Status PENDING  Incomplete  SARS Coronavirus 2 [facility] order, Performed in [place] hospital lab)     Status: None   Collection Time: 12/02/18  9:27 AM  Result Value Ref Range Status   SARS Coronavirus 2 NEGATIVE NEGATIVE Final    Comment: (NOTE) If result is NEGATIVE SARS-CoV-2 target nucleic acids are NOT DETECTED. The SARS-CoV-2 RNA is generally detectable in upper and lower  respiratory specimens during the acute phase of infection. The lowest  concentration of SARS-CoV-2 viral copies this assay can detect is 250  copies / mL. A negative result does not preclude SARS-CoV-2 infection  and should not be used as the sole basis for treatment or other  patient management decisions.  A negative result may occur with  improper specimen collection / handling, submission of specimen other  than nasopharyngeal swab, presence of viral mutation(s) within the  areas targeted by this assay, and inadequate number of viral copies  (<250 copies / mL). A negative result must be combined with clinical  observations, patient history, and epidemiological information. If result is POSITIVE SARS-CoV-2 target nucleic  acids are DETECTED. The SARS-CoV-2 RNA is generally detectable in upper and lower  respiratory specimens dur ing the acute phase of infection.  Positive  results are indicative of active infection with SARS-CoV-2.  Clinical  correlation with patient history and other diagnostic information is  necessary to determine patient infection status.  Positive results do  not rule out bacterial infection or co-infection with other viruses. If result is PRESUMPTIVE POSTIVE SARS-CoV-2 nucleic acids MAY BE PRESENT.   A presumptive positive result was obtained on the submitted specimen  and confirmed on repeat testing.  While 2019 novel coronavirus  (SARS-CoV-2) nucleic acids may be present in the submitted sample  additional confirmatory testing may be necessary for epidemiological  and / or clinical management purposes  to differentiate between  SARS-CoV-2 and other Sarbecovirus currently known to infect humans.  If clinically indicated additional testing with an alternate test  methodology [id number]) is advised. The SARS-CoV-2 RNA is generally  detectable in upper and lower respiratory sp ecimens during the acute  phase of infection. The expected result is Negative. Fact Sheet for Patients:  StrictlyIdeas.no Fact Sheet for Healthcare Providers: BankingDealers.co.za This test is not yet approved or cleared by the [place] FDA and has been authorized for detection and/or diagnosis of SARS-CoV-2 by FDA under an Emergency Use Authorization (EUA).  This EUA will remain in effect (meaning this test can be used) for the duration of the COVID-19 declaration under Section 564(b)(1) of the Act, 21 U.S.C. section 360bbb-3(b)(1), unless the authorization is terminated or revoked sooner. Performed at [facility] [address], [place], [place] [address]   Urine culture     Status: Abnormal   Collection Time: 12/02/18  9:46 AM  Result Value Ref Range  Status   Specimen Description   Final    URINE, RANDOM Performed at [facility], [address], [place], [place] [address]    Special Requests   Final    NONE Performed at [facility], [address], [place], [place] [address]    Culture (A)  Final    <10,000 COLONIES/mL INSIGNIFICANT GROWTH Performed at [facility] [address] [place], [place] [address]    Report Status 12/03/2018 FINAL  Final  Culture, blood (x 2)     Status: None (Preliminary result)   Collection Time: 12/02/18  3:22 PM  Result Value Ref Range Status   Specimen Description BLOOD LEFT WRIST  Final   Special Requests   Final    BOTTLES DRAWN AEROBIC AND ANAEROBIC Blood Culture adequate volume   Culture   Final    NO GROWTH < 24 HOURS Performed at [facility], [address] [place], [place] [address]    Report Status PENDING  Incomplete   Studies: Ct Head Wo Contrast  Result Date: 12/04/2018 CLINICAL DATA:  Altered LOC, fever, recent fall EXAM: CT HEAD WITHOUT CONTRAST TECHNIQUE: Contiguous axial images were obtained from the base of the skull through the vertex without intravenous contrast. COMPARISON:  08/04/2018 FINDINGS: Brain: No evidence of acute infarction, hemorrhage, hydrocephalus, extra-axial collection or mass lesion/mass effect. Periventricular white matter hypodensity. The Vascular: No hyperdense vessel or unexpected calcification. Skull: Normal. Negative for fracture or focal lesion. Sinuses/Orbits: No acute finding. Other: None. IMPRESSION: No acute intracranial pathology.  Small-vessel white matter disease. Electronically Signed   By: Eddie Candle M.D.   On: 12/04/2018 13:02   Nm Pulmonary Perfusion  Result Date: 12/02/2018 CLINICAL DATA:  78 year old male with cough and shortness of breath. Positive D-dimer. EXAM: NUCLEAR MEDICINE PERFUSION SCAN TECHNIQUE: Perfusion images were obtained in multiple projections after intravenous injection of radiopharmaceutical. Ventilation images were  not obtained in accordance with pandemic protocol. RADIOPHARMACEUTICALS:  4.2 mCi [id number] COMPARISON:  Noncontrast chest CT earlier today. FINDINGS: Ventilation: Not performed. Perfusion: Normal, homogeneous perfusion radiotracer activity throughout both lungs. No perfusion defect identified. IMPRESSION: Homogeneous pulmonary perfusion, no evidence of pulmonary embolus. Electronically Signed   By: HGenevie AnnM.D.   On: 12/02/2018 20:16   Portable Chest 1 View  Result Date: 12/03/2018 CLINICAL DATA:  Sepsis.  Cough. EXAM: PORTABLE CHEST 1 VIEW COMPARISON:  One-view chest x-ray and CT chest 12/02/2018 FINDINGS: The heart is enlarged, exaggerated by low lung volumes. Chronic elevation of the right hemidiaphragm is again noted.  There is no edema or effusion. No significant airspace consolidation is present. Aeration is slightly improved. Lung volumes remain low. IMPRESSION: 1. Slight improved aeration with persistent low lung volumes. 2. Chronic elevation of the right hemidiaphragm. Electronically Signed   By: San Morelle M.D.   On: 12/03/2018 07:01   Scheduled Meds: . amLODipine  10 mg Oral Daily  . aspirin EC  81 mg Oral QHS  . carvedilol  25 mg Oral BID WC  . dextromethorphan  60 mg Oral BID  . doxazosin  8 mg Oral Daily  . doxycycline  100 mg Oral Q12H  . enoxaparin (LOVENOX) injection  30 mg Subcutaneous Q24H  . feeding supplement (GLUCERNA SHAKE)  237 mL Oral TID BM  . insulin aspart  0-5 Units Subcutaneous QHS  . insulin aspart  0-9 Units Subcutaneous TID WC  . insulin glargine  8 Units Subcutaneous QHS  . lipase/protease/amylase  36,000 Units Oral TID AC  . multivitamin with minerals  1 tablet Oral Daily  . pravastatin  10 mg Oral q1800   Continuous Infusions: . sodium chloride 10 mL/hr at 12/03/18 1700    Active Problems:   Coronary atherosclerosis of native coronary artery   Essential hypertension, benign   Mixed hyperlipidemia   NAFLD (nonalcoholic fatty liver disease)    DM type 2 with diabetic peripheral neuropathy ([place])   Uncontrolled type 2 diabetes mellitus with hyperglycemia ([facility])   Hyperkalemia   Fever, unknown origin   Altered mental state   Acute on chronic renal failure [facility])   Pancreatic insufficiency   Wears hearing aid   HOH (hard of hearing)  Time spent:   Irwin Brakeman, MD [facility] 12/04/2018, 2:27 PM    LOS: 2 days  How to contact the [facility] Attending or Consulting provider [place] or covering provider during after hours [place], for this patient?  1. Check the care team in [facility] and look for a) attending/consulting TR[facility] provider listed and b) the [facility] team listed 2. Log into [email or web address] and use [place]'s universal password to access. If you do not have the password, please contact the hospital operator. 3. Locate the [facility] provider you are looking for under [facility] and page to a number that you can be directly reached. 4. If you still have difficulty reaching the provider, please page the [facility] (Director on Call) for the Hospitalists listed on amion for assistance.

## 2018-12-04 NOTE — Progress Notes (Signed)
Inpatient Diabetes Program Recommendations  AACE/ADA: New Consensus Statement on Inpatient Glycemic Control (2015)  Target Ranges:  Prepandial:   less than 140 mg/dL      Peak postprandial:   less than 180 mg/dL (1-2 hours)      Critically ill patients:  140 - 180 mg/dL   Results for [name], Mark SR. (MRN [id number]) as of 12/04/2018 14:08  Ref. Range 12/03/2018 07:39 12/03/2018 11:10 12/03/2018 16:17 12/03/2018 16:40 12/03/2018 21:33  Glucose-Capillary Latest Ref Range: 70 - 99 mg/dL 138 (H)  7 units NOVOLOG  119 (H)  6 units NOVOLOG  44 (LL) 156 (H) 143 (H)    8 units LANTUS   Results for [name], Mark SR. (MRN [id number]) as of 12/04/2018 14:08  Ref. Range 12/04/2018 07:26 12/04/2018 12:52  Glucose-Capillary Latest Ref Range: 70 - 99 mg/dL 183 (H)  2 units NOVOLOG  244 (H)  3 units NOVOLOG      Admit Viral URI (COVID was Negative)/ Fever/ Hyperkalemia.   History: DM, CKD  Home DM Meds: NPH 12 units AM/ 8 units PM       Regular 8 units TID with meals        Metformin 500 mg BID  Current Orders: Lantus 8 units QHS       Novolog Sensitive Correction Scale/ SSI (0-9 units) TID AC + HS      Current A1c= 7.8%.   Severe HYPO yest at 4pm after getting 6 units Novolog Meal Coverage at [facility].   Novolog Meal Coverage stopped and Lantus dose reduced last PM.  CBGs elevated today.    MD- Please consider the following in-hospital insulin adjustments:  1. Increase Lantus slightly to 10 units QHS  2. Restart Novolog Meal Coverage at lower dose:  Novolog 4 units TID with meals  (Please add the following Hold Parameters: Hold if pt eats <50% of meal, Hold if pt NPO)     --Will follow patient during hospitalization--  Wyn Quaker RN, MSN, CDE Diabetes Coordinator Inpatient Glycemic Control Team Team Pager: [phone] (8a-5p)

## 2018-12-04 NOTE — Progress Notes (Signed)
Dr. Darrick Meigs came to floor to assess patient. Dr. Darrick Meigs placed one time order for lasix 48m IV. Will continue to monitor pt throughout shift.

## 2018-12-04 NOTE — Care Management Important Message (Signed)
Important Message  Patient Details  Name: Mark PULLMAN Sr. MRN: [id number] Date of Birth: [birth year]   Medicare Important Message Given:  Yes    Tommy Medal 12/04/2018, 12:05 PM

## 2018-12-04 NOTE — Evaluation (Signed)
Clinical/Bedside Swallow Evaluation Patient Details  Name: Mark RIDEAUX Sr. MRN: [id number] Date of Birth: [birth year]  Today'[name] Date: 12/04/2018 Time: SLP Start Time (ACUTE ONLY): 1110 SLP Stop Time (ACUTE ONLY): 1135 SLP Time Calculation (min) (ACUTE ONLY): 25 min  Past Medical History:  Past Medical History:  Diagnosis Date  . Arthritis    lower back  . Chronic back pain    Disc disease  . Chronic kidney disease   . Coronary atherosclerosis of native coronary artery    Coronary calcifications by chest CT, Myoview demonstrating inferolateral scar  . Deafness in right ear   . Diabetes mellitus   . Diabetic retinopathy ([place])    moderate nonproliferative with macular edema in right eye  . Diastolic dysfunction 11/979   Grade 1.  . Essential hypertension, benign   . Heart murmur    in past  . HOH (hard of hearing)   . Hyperkalemia   . Kidney stones   . Mixed hyperlipidemia   . NAFLD (nonalcoholic fatty liver disease)   . Pancreatic insufficiency    Diagnosed at [facility]  . Prostate cancer ([place]) 2011  . Shortness of breath dyspnea    occasional - liver pressing on right lung, decreased capacity  . Subdural hematoma ([place])   . Type 2 diabetes mellitus ([place])   . Wears hearing aid    "crossover" form right to left   Past Surgical History:  Past Surgical History:  Procedure Laterality Date  . APPENDECTOMY    . Bilateral hip replacement    . CATARACT EXTRACTION W/PHACO Left 05/15/2015   Procedure: CATARACT EXTRACTION PHACO AND INTRAOCULAR LENS PLACEMENT (IOC);  Surgeon: Ronnell Freshwater, MD;  Location: [facility];  Service: Ophthalmology;  Laterality: Left;  DIABETIC - insulin pump and oral meds  . CATARACT EXTRACTION W/PHACO Right 08/28/2015   Procedure: CATARACT EXTRACTION PHACO AND INTRAOCULAR LENS PLACEMENT (IOC);  Surgeon: Ronnell Freshwater, MD;  Location: [facility];  Service: Ophthalmology;  Laterality: Right;  DIABETIC PER PT AND CINDY  PLEASE KEEP ARRIVAL TIME AFTER 8AM   . CHOLECYSTECTOMY    . HERNIA REPAIR    . PROSTATECTOMY  2011   HPI:  Mark L Christians Sr. is a 78 y.o. male with chronic back pain on oxycodone tablets at home, chronic kidney disease, type 2 diabetes mellitus on insulin, diabetic complications including retinopathy and nephropathy, essential hypertension, diastolic heart dysfunction, coronary artery disease who was brought into the emergency department by EMS with complaints of 1 day of increasing weakness, fever and altered mentation.  Patient had a fever greater than 101 at home according to his wife.  They apparently have been monitoring his blood glucose readings at home and they have been in the low 100 range.  He has not had any changes in his medications or activities.  He has had episodes of confusion associated with his home medications in the past.  He is followed by his primary care provider for that.  The patient apparently does continue to take oxycodone.  He had been taken off Valium by his primary care provider.  On arrival he was noted to be hypoxic with a pulse ox of 82% and was given supplemental oxygen with improvement to 95%.  The patient denied headache and visual changes.  There was no nuchal rigidity.  His chest x-ray was unremarkable for any acute findings.  His metabolic panel was positive for hyperkalemia with a sodium of 6.1.  His creatinine was noted to be  1.90.  His d-dimer was elevated at 0.75.  His white blood cell count was 8.6.  Hemoglobin was 11.0, platelet count 178.  Lactic acid 1.4.  Procalcitonin 0.10.  Blood glucose 132.  His urinalysis was unremarkable with no signs of infection found.  His EKG showed signs of tachycardia and hyperkalemia with abnormal R wave progression and slight peaked T waves.  Sed rate was 11.  He did have COVID-19 testing done in the ED that was negative.  Blood cultures obtained and urine cultures obtained.  The patient was started on broad-spectrum  antibiotic therapy and admission was requested.  His vital signs have been stable with a heart rate of 102, blood pressure 97/66, initial temperature 102.2 improved to 100.1 after Tylenol administration.  The patient remained 99% oxygen saturation on 1 L nasal cannula.   Assessment / Plan / Recommendation Clinical Impression  Clinical swallow evaluation completed at bedside. Pt currently with altered mental status which is very different from his baseline per chart review. Pt reportedly fell from a ladder about a month ago and Pt does endorse this when asked, but states he did not hit his head. CT head is pending. Pt presents with cognitive based dysphagia characterized by reduced attention to task resulting in oral holding and suspected delay in swallow initiation. Pt does not currenlty exhibit signs of pharygeal phase dysphagia, however oral deficits increase risk for pharyngeal dysphagia. Pt will need assist and supervision with meals. Recommend D3/mech soft and thin liquids (cup/straw ok), po medications whole with water. Pt will need SLE if cognition does not improve. SLP will follow during acute stay.   SLP Visit Diagnosis: Dysphagia, unspecified (R13.10)    Aspiration Risk  Mild aspiration risk    Diet Recommendation Dysphagia 3 (Mech soft);Thin liquid   Liquid Administration via: Cup;Straw Medication Administration: Whole meds with liquid Supervision: Staff to assist with self feeding;Full supervision/cueing for compensatory strategies Compensations: Slow rate;Small sips/bites(monitor oral residue) Postural Changes: Seated upright at 90 degrees;Remain upright for at least 30 minutes after po intake    Other  Recommendations Oral Care Recommendations: Oral care BID;Staff/trained caregiver to provide oral care Other Recommendations: Clarify dietary restrictions   Follow up Recommendations Skilled Nursing facility;Home health SLP(will need SLE if cognition does not improve)       Frequency and Duration min 2x/week  1 week       Prognosis Prognosis for Safe Diet Advancement: Good Barriers to Reach Goals: Cognitive deficits      Swallow Study   General Date of Onset: 12/02/18 HPI: Mark PATELLA Sr. is a 78 y.o. male with chronic back pain on oxycodone tablets at home, chronic kidney disease, type 2 diabetes mellitus on insulin, diabetic complications including retinopathy and nephropathy, essential hypertension, diastolic heart dysfunction, coronary artery disease who was brought into the emergency department by EMS with complaints of 1 day of increasing weakness, fever and altered mentation.  Patient had a fever greater than 101 at home according to his wife.  They apparently have been monitoring his blood glucose readings at home and they have been in the low 100 range.  He has not had any changes in his medications or activities.  He has had episodes of confusion associated with his home medications in the past.  He is followed by his primary care provider for that.  The patient apparently does continue to take oxycodone.  He had been taken off Valium by his primary care provider.  On arrival he was noted to  be hypoxic with a pulse ox of 82% and was given supplemental oxygen with improvement to 95%.  The patient denied headache and visual changes.  There was no nuchal rigidity.  His chest x-ray was unremarkable for any acute findings.  His metabolic panel was positive for hyperkalemia with a sodium of 6.1.  His creatinine was noted to be 1.90.  His d-dimer was elevated at 0.75.  His white blood cell count was 8.6.  Hemoglobin was 11.0, platelet count 178.  Lactic acid 1.4.  Procalcitonin 0.10.  Blood glucose 132.  His urinalysis was unremarkable with no signs of infection found.  His EKG showed signs of tachycardia and hyperkalemia with abnormal R wave progression and slight peaked T waves.  Sed rate was 11.  He did have COVID-19 testing done in the ED that was negative.   Blood cultures obtained and urine cultures obtained.  The patient was started on broad-spectrum antibiotic therapy and admission was requested.  His vital signs have been stable with a heart rate of 102, blood pressure 97/66, initial temperature 102.2 improved to 100.1 after Tylenol administration.  The patient remained 99% oxygen saturation on 1 L nasal cannula. Type of Study: Bedside Swallow Evaluation Previous Swallow Assessment: None on record Diet Prior to this Study: Regular;Thin liquids(heart healthy, carb mod) Temperature Spikes Noted: No Respiratory Status: Room air History of Recent Intubation: No Behavior/Cognition: Alert;Cooperative;Requires cueing;Confused Oral Cavity Assessment: Within Functional Limits Oral Care Completed by SLP: Yes Oral Cavity - Dentition: Adequate natural dentition Vision: Functional for self-feeding Self-Feeding Abilities: Needs assist Patient Positioning: Upright in bed Baseline Vocal Quality: Normal Volitional Cough: Cognitively unable to elicit Volitional Swallow: Unable to elicit    Oral/Motor/Sensory Function Overall Oral Motor/Sensory Function: Within functional limits   Ice Chips Ice chips: Within functional limits Presentation: Spoon   Thin Liquid Thin Liquid: Impaired Presentation: Cup;Self Fed;Straw Oral Phase Impairments: (oral holding) Oral Phase Functional Implications: Oral holding    Nectar Thick Nectar Thick Liquid: Not tested   Honey Thick Honey Thick Liquid: Not tested   Puree Puree: Within functional limits Presentation: Spoon   Solid     Solid: Impaired Presentation: Spoon Oral Phase Impairments: Poor awareness of bolus Oral Phase Functional Implications: Prolonged oral transit;Oral residue     Thank you,  Genene Churn, [place]  PORTER,DABNEY 12/04/2018,11:50 AM

## 2018-12-05 LAB — CBC WITH DIFFERENTIAL/PLATELET
Abs Immature Granulocytes: 0.01 10*3/uL (ref 0.00–0.07)
Basophils Absolute: 0 10*3/uL (ref 0.0–0.1)
Basophils Relative: 1 %
Eosinophils Absolute: 0.2 10*3/uL (ref 0.0–0.5)
Eosinophils Relative: 4 %
HCT: 33.2 % — ABNORMAL LOW (ref 39.0–52.0)
Hemoglobin: 10.5 g/dL — ABNORMAL LOW (ref 13.0–17.0)
Immature Granulocytes: 0 %
Lymphocytes Relative: 25 %
Lymphs Abs: 1.6 10*3/uL (ref 0.7–4.0)
MCH: 30 pg (ref 26.0–34.0)
MCHC: 31.6 g/dL (ref 30.0–36.0)
MCV: 94.9 fL (ref 80.0–100.0)
Monocytes Absolute: 0.7 10*3/uL (ref 0.1–1.0)
Monocytes Relative: 11 %
Neutro Abs: 3.8 10*3/uL (ref 1.7–7.7)
Neutrophils Relative %: 59 %
Platelets: 172 10*3/uL (ref 150–400)
RBC: 3.5 MIL/uL — ABNORMAL LOW (ref 4.22–5.81)
RDW: 13.3 % (ref 11.5–15.5)
WBC: 6.4 10*3/uL (ref 4.0–10.5)
nRBC: 0 % (ref 0.0–0.2)

## 2018-12-05 LAB — GLUCOSE, CAPILLARY
Glucose-Capillary: 148 mg/dL — ABNORMAL HIGH (ref 70–99)
Glucose-Capillary: 138 mg/dL — ABNORMAL HIGH (ref 70–99)

## 2018-12-05 LAB — MAGNESIUM: Magnesium: 1.5 mg/dL — ABNORMAL LOW (ref 1.7–2.4)

## 2018-12-05 MED ORDER — GLUCERNA SHAKE PO LIQD: 237.0000 mL | Freq: Three times a day (TID) | ORAL | 0 refills | Status: AC

## 2018-12-05 MED ORDER — DOXYCYCLINE HYCLATE 100 MG PO TABS: 100.0000 mg | ORAL_TABLET | Freq: Two times a day (BID) | ORAL | 0 refills | Status: AC

## 2018-12-05 MED ORDER — MAGNESIUM SULFATE 2 GM/50ML IV SOLN
2.0000 g | Freq: Once | INTRAVENOUS | Status: AC
  Administered 2018-12-05: 2 g via INTRAVENOUS
  Filled 2018-12-05: qty 50

## 2018-12-05 MED ORDER — ACETAMINOPHEN 325 MG PO TABS: 650.0000 mg | ORAL_TABLET | Freq: Four times a day (QID) | ORAL | Status: DC | PRN

## 2018-12-05 NOTE — Discharge Instructions (Signed)
Antibiotic Medicine, Adult  Antibiotic medicines treat infections caused by a type of germ called bacteria. They work by killing the bacteria that make you sick. When do I need to take antibiotics? You often need these medicines to treat bacterial infections, such as:  A urinary tract infection (UTI).  Strep throat.  Meningitis. This affects the spinal cord and brain.  A bad lung infection. You may start the medicines while your doctor waits for tests to come back. When the tests come back, your doctor may change or stop your medicine. When are antibiotics not needed? You do not need these medicines for most common illnesses, such as:  A cold.  The flu.  A sore throat. Antibiotics are not always needed for all infections caused by bacteria. Do not ask for these medicines, or take them, when they are not needed. What are the risks of taking antibiotics? Most antibiotics can cause an infection called Clostridioides difficile (C. diff).This causes watery poop (diarrhea). Let your doctor know right away if:  You have watery poop while taking an antibiotic.  You have watery poop after you stop taking an antibiotic. The illness can happen weeks after you stop the medicine. You also have a risk of getting an infection in the future that antibiotics cannot treat (antibiotic-resistant infection). This type of infection can be dangerous. What else should I know about taking antibiotics?   You need to take the entire prescription. ? Take the medicine for as long as told by your doctor. ? Do not stop taking it even if you start to feel better.  Try not to miss any doses. If you miss a dose, call your doctor.  Birth control pills may not work. If you take birth control pills: ? Keep on taking them. ? Use a second form of birth control, such as a condom. Do this for as long as told by your doctor.  Ask your doctor: ? How long to wait in between doses. ? If you should take the medicine  with food. ? If there is anything you should stay away from while taking the antibiotic, such as: ? Food. ? Drinks. ? Medicines. ? If there are any side effects you should watch for.  Only take the medicines that your doctor told you to take. Do not take medicines that were given to someone else.  Drink a large glass of water with the medicine.  Ask the pharmacist for a tool to measure the medicine, such as: ? A syringe. ? A cup. ? A spoon.  Throw away any extra medicine. Contact a doctor if:  You get worse.  You have new joint pain or muscle aches after starting the medicine.  You have side effects from the medicine, such as: ? Stomach pain. ? Watery poop. ? Feeling sick to your stomach (nausea). Get help right away if:  You have signs of a very bad allergic reaction. If this happens, stop taking the medicine right away. Signs may include: ? Hives. These are raised, itchy, red bumps on the skin. ? Skin rash. ? Trouble breathing. ? Wheezing. ? Swelling. ? Feeling dizzy. ? Throwing up (vomiting).  Your pee (urine) is dark, or is the color of blood.  Your skin turns yellow.  You bruise easily.  You bleed easily.  You have very bad watery poop and cramps in your belly.  You have a very bad headache. Summary  Antibiotics are often used to treat infections caused by bacteria.  Only take  these medicines when needed.  Let your doctor know if you have watery poop while taking an antibiotic.  You need to take the entire prescription. This information is not intended to replace advice given to you by your health care provider. Make sure you discuss any questions you have with your health care provider. Document Released: 04/30/2008 Document Revised: 01/20/2018 Document Reviewed: 07/24/2016 Elsevier Interactive Patient Education  2019 [employer]   IMPORTANT INFORMATION: PAY CLOSE ATTENTION   PHYSICIAN DISCHARGE INSTRUCTIONS  Follow with Primary care  provider  Mark Frizzle, MD  and other consultants as instructed your Hospitalist Physician  [place] IF SYMPTOMS COME BACK, WORSEN OR NEW PROBLEM DEVELOPS.   Please note: You were cared for by a hospitalist during your hospital stay. Every effort will be made to forward records to your primary care provider.  You can request that your primary care provider send for your hospital records if they have not received them.  Once you are discharged, your primary care physician will handle any further medical issues. Please note that NO REFILLS for any discharge medications will be authorized once you are discharged, as it is imperative that you return to your primary care physician (or establish a relationship with a primary care physician if you do not have one) for your post hospital discharge needs so that they can reassess your need for medications and monitor your lab values.  Please get a complete blood count and chemistry panel checked by your Primary MD at your next visit, and again as instructed by your Primary MD.  Get Medicines reviewed and adjusted: Please take all your medications with you for your next visit with your Primary MD  Laboratory/radiological data: Please request your Primary MD to go over all hospital tests and procedure/radiological results at the follow up, please ask your primary care provider to get all Hospital records sent to his/her office.  In some cases, they will be blood work, cultures and biopsy results pending at the time of your discharge. Please request that your primary care provider follow up on these results.  If you are diabetic, please bring your blood sugar readings with you to your follow up appointment with primary care.    Please call and make your follow up appointments as soon as possible.    Also Note the following: If you experience worsening of your admission symptoms, develop shortness of breath, life  threatening emergency, suicidal or homicidal thoughts you must seek medical attention immediately by calling 911 or calling your MD immediately  if symptoms less severe.  You must read complete instructions/literature along with all the possible adverse reactions/side effects for all the Medicines you take and that have been prescribed to you. Take any new Medicines after you have completely understood and accpet all the possible adverse reactions/side effects.   Do not drive when taking Pain medications or sleeping medications (Benzodiazepines)  Do not take more than prescribed Pain, Sleep and Anxiety Medications. It is not advisable to combine anxiety,sleep and pain medications without talking with your primary care practitioner  Special Instructions: If you have smoked or chewed Tobacco  in the last 2 yrs please stop smoking, stop any regular Alcohol  and or any Recreational drug use.  Wear Seat belts while driving.

## 2018-12-05 NOTE — Discharge Summary (Signed)
Physician Discharge Summary  TYAN DY Sr. ZOX:[id number] DOB: 05-24-1941 DOA: 12/02/2018  PCP: [name], MD  Admit date: 12/02/2018 Discharge date: 12/05/2018  Admitted From:  Home  Disposition: Home   Recommendations for Outpatient Follow-up:  1. Follow up with PCP in 1 weeks 2. Please obtain BMP/CBC in 1-2 weeks  Home Health: PT, RN  Discharge Condition: STABLE   CODE STATUS: FULL    Brief Hospitalization Summary: Please see all hospital notes, images, labs for full details of the hospitalization. HPI: Mark BUA Sr. is a 78 y.o. male with chronic back pain on oxycodone tablets at home, chronic kidney disease, type 2 diabetes mellitus on insulin, diabetic complications including retinopathy and nephropathy, essential hypertension, diastolic heart dysfunction, coronary artery disease who was brought into the emergency department by EMS with complaints of 1 day of increasing weakness, fever and altered mentation.  Patient had a fever greater than 101 at home according to his wife.  They apparently have been monitoring his blood glucose readings at home and they have been in the low 100 range.  He has not had any changes in his medications or activities.  He has had episodes of confusion associated with his home medications in the past.  He is followed by his primary care provider for that.  The patient apparently does continue to take oxycodone.  He had been taken off Valium by his primary care provider.  On arrival he was noted to be hypoxic with a pulse ox of 82% and was given supplemental oxygen with improvement to 95%.  The patient denied headache and visual changes.  There was no nuchal rigidity.  His chest x-ray was unremarkable for any acute findings.  His metabolic panel was positive for hyperkalemia with a sodium of 6.1.  His creatinine was noted to be 1.90.  His d-dimer was elevated at 0.75.  His white blood cell count was 8.6.  Hemoglobin was 11.0, platelet count  178.  Lactic acid 1.4.  Procalcitonin 0.10.  Blood glucose 132.  His urinalysis was unremarkable with no signs of infection found.  His EKG showed signs of tachycardia and hyperkalemia with abnormal R wave progression and slight peaked T waves.  Sed rate was 11.  He did have COVID-19 testing done in the ED that was negative.  Blood cultures obtained and urine cultures obtained.  The patient was started on broad-spectrum antibiotic therapy and admission was requested.  His vital signs have been stable with a heart rate of 102, blood pressure 97/66, initial temperature 102.2 improved to 100.1 after Tylenol administration.  The patient remained 99% oxygen saturation on 1 L nasal cannula.  Brief Admission Hx: 78 y/o male admitted with altered mental status, fever and dehydration.   MDM/Assessment & Plan:   1. URI -COVID testing has been negative.  Repeat chest x-ray does not show any signs of pneumonia.  He is coughing and wheezing and likely has a viral URI.  He has been started on nebulizer treatments for wheezing and coughing and he has iimproved. He is also treated with course of doxycycline.   2. Acute delirium - patient had some sundowning.  Pt had CT head no acute findings. He is back to his baseline today.   3. Altered mental state-likely was related to fever-he seems to have improved to his baseline and he is oriented x3 at this time.  His fever has resolved. 4. Fever- RESOLVED. His temperature has come down with supportive therapy.  Suspect this  was related to upper respiratory illness.  Continue Tylenol as needed. 5. Hyperkalemia-this has been treated and now resolved.  His losartan has been discontinued.  He received Lokelma and fluid hydration for treatment. 6. Uncontrolled type 2 diabetes mellitus with hyperglycemia and renal with neurological complications-patient was treated with sliding scale coverage in addition to Lantus and prandial insulin as ordered.  Continue to monitor CBGs  closely. 7. Essential hypertension- his blood pressure seems to be controlled at this time we will continue to follow. 8. Pancreatic insufficiency-I spoke with wife and this is chronic for him he has diarrhea from this and is treated with enzymes according to his wife.  We have resumed his home enzymes. 9. Acute on chronic renal failure-he has had some improvement with gentle hydration. 10. Elevated d-dimer-VQ scan negative for PE. 11. Mixed hyperlipidemia-resumed home pravastatin nightly.  DVT prophylaxis: Lovenox Code Status: full  Family Communication: wife telephoned Disposition Plan: home with HHPT  Consultants:  n/a  Procedures:  n/a Discharge Diagnoses:  Active Problems:   Coronary atherosclerosis of native coronary artery   Essential hypertension, benign   Mixed hyperlipidemia   NAFLD (nonalcoholic fatty liver disease)   DM type 2 with diabetic peripheral neuropathy (HCC)   Uncontrolled type 2 diabetes mellitus with hyperglycemia (HCC)   Hyperkalemia   Fever, unknown origin   Altered mental state   Acute on chronic renal failure [facility])   Pancreatic insufficiency   Wears hearing aid   HOH (hard of hearing)   Discharge Instructions: Discharge Instructions    Call MD for:  difficulty breathing, headache or visual disturbances   Complete by:  As directed    Call MD for:  extreme fatigue   Complete by:  As directed    Call MD for:  persistant dizziness or light-headedness   Complete by:  As directed    Diet - low sodium heart healthy   Complete by:  As directed    Increase activity slowly   Complete by:  As directed      Allergies as of 12/05/2018      Reactions   Enalapril Maleate Cough      Medication List    STOP taking these medications   gabapentin 300 MG capsule Commonly known as:  NEURONTIN   hydrochlorothiazide 25 MG tablet Commonly known as:  HYDRODIURIL   metFORMIN 500 MG 24 hr tablet Commonly known as:  GLUCOPHAGE-XR   oxyCODONE 5 MG  immediate release tablet Commonly known as:  Roxicodone     TAKE these medications   acetaminophen 325 MG tablet Commonly known as:  TYLENOL Take 2 tablets (650 mg total) by mouth every 6 (six) hours as needed for mild pain (or Fever >/= 101).   albuterol 108 (90 Base) MCG/ACT inhaler Commonly known as:  VENTOLIN HFA Inhale 2 puffs into the lungs every 4 (four) hours as needed for wheezing or shortness of breath.   amLODipine 10 MG tablet Commonly known as:  NORVASC Take 1 tablet (10 mg total) by mouth daily.   aspirin EC 81 MG tablet Take 81 mg by mouth at bedtime.   carvedilol 25 MG tablet Commonly known as:  COREG TAKE 1 TABLET(25 MG) BY MOUTH TWICE DAILY What changed:  See the new instructions.   doxazosin 8 MG tablet Commonly known as:  CARDURA TAKE 1 TABLET(8 MG) BY MOUTH DAILY What changed:  See the new instructions.   doxycycline 100 MG tablet Commonly known as:  VIBRA-TABS Take 1 tablet (100 mg  total) by mouth every 12 (twelve) hours for 3 days.   feeding supplement (GLUCERNA SHAKE) Liqd Take 237 mLs by mouth 3 (three) times daily between meals for 14 days.   insulin NPH Human 100 UNIT/ML injection Commonly known as:  NOVOLIN N Inject 8-12 Units into the skin at bedtime. Take 12 units qam and 8 units qhs   insulin regular 100 units/mL injection Commonly known as:  NOVOLIN R Inject 8 Units into the skin 3 (three) times daily before meals.   losartan 100 MG tablet Commonly known as:  COZAAR Take 100 mg by mouth daily.   multivitamin with minerals Tabs tablet Take 1 tablet by mouth daily.   pravastatin 10 MG tablet Commonly known as:  PRAVACHOL TAKE 1 TABLET(10 MG) BY MOUTH DAILY WITH BREAKFAST What changed:  See the new instructions.   Zenpep 10000-32000 units Cpep Generic drug:  Pancrelipase (Lip-Prot-Amyl) TAKE 1 CAPSULE BY MOUTH THREE TIMES DAILY BEFORE MEALS What changed:  See the new instructions.      Follow-up Information    [name], MD Follow up in 6 day(s).   Specialty:  Family Medicine Why:  Appointment with Dr. Dennard Schaumann at 9:30 on Thursday 12/10/2018. Contact information: [address] [place] [address] [phone]          Allergies  Allergen Reactions  . Enalapril Maleate Cough   Allergies as of 12/05/2018      Reactions   Enalapril Maleate Cough      Medication List    STOP taking these medications   gabapentin 300 MG capsule Commonly known as:  NEURONTIN   hydrochlorothiazide 25 MG tablet Commonly known as:  HYDRODIURIL   metFORMIN 500 MG 24 hr tablet Commonly known as:  GLUCOPHAGE-XR   oxyCODONE 5 MG immediate release tablet Commonly known as:  Roxicodone     TAKE these medications   acetaminophen 325 MG tablet Commonly known as:  TYLENOL Take 2 tablets (650 mg total) by mouth every 6 (six) hours as needed for mild pain (or Fever >/= 101).   albuterol 108 (90 Base) MCG/ACT inhaler Commonly known as:  VENTOLIN HFA Inhale 2 puffs into the lungs every 4 (four) hours as needed for wheezing or shortness of breath.   amLODipine 10 MG tablet Commonly known as:  NORVASC Take 1 tablet (10 mg total) by mouth daily.   aspirin EC 81 MG tablet Take 81 mg by mouth at bedtime.   carvedilol 25 MG tablet Commonly known as:  COREG TAKE 1 TABLET(25 MG) BY MOUTH TWICE DAILY What changed:  See the new instructions.   doxazosin 8 MG tablet Commonly known as:  CARDURA TAKE 1 TABLET(8 MG) BY MOUTH DAILY What changed:  See the new instructions.   doxycycline 100 MG tablet Commonly known as:  VIBRA-TABS Take 1 tablet (100 mg total) by mouth every 12 (twelve) hours for 3 days.   feeding supplement (GLUCERNA SHAKE) Liqd Take 237 mLs by mouth 3 (three) times daily between meals for 14 days.   insulin NPH Human 100 UNIT/ML injection Commonly known as:  NOVOLIN N Inject 8-12 Units into the skin at bedtime. Take 12 units qam and 8 units qhs   insulin regular 100 units/mL  injection Commonly known as:  NOVOLIN R Inject 8 Units into the skin 3 (three) times daily before meals.   losartan 100 MG tablet Commonly known as:  COZAAR Take 100 mg by mouth daily.   multivitamin with minerals Tabs tablet  Take 1 tablet by mouth daily.   pravastatin 10 MG tablet Commonly known as:  PRAVACHOL TAKE 1 TABLET(10 MG) BY MOUTH DAILY WITH BREAKFAST What changed:  See the new instructions.   Zenpep 10000-32000 units Cpep Generic drug:  Pancrelipase (Lip-Prot-Amyl) TAKE 1 CAPSULE BY MOUTH THREE TIMES DAILY BEFORE MEALS What changed:  See the new instructions.       Procedures/Studies: Ct Head Wo Contrast  Result Date: 12/04/2018 CLINICAL DATA:  Altered LOC, fever, recent fall EXAM: CT HEAD WITHOUT CONTRAST TECHNIQUE: Contiguous axial images were obtained from the base of the skull through the vertex without intravenous contrast. COMPARISON:  08/04/2018 FINDINGS: Brain: No evidence of acute infarction, hemorrhage, hydrocephalus, extra-axial collection or mass lesion/mass effect. Periventricular white matter hypodensity. The Vascular: No hyperdense vessel or unexpected calcification. Skull: Normal. Negative for fracture or focal lesion. Sinuses/Orbits: No acute finding. Other: None. IMPRESSION: No acute intracranial pathology.  Small-vessel white matter disease. Electronically Signed   By: Eddie Candle M.D.   On: 12/04/2018 13:02   Ct Chest Wo Contrast  Result Date: 12/02/2018 CLINICAL DATA:  Cough, fever, and weakness. EXAM: CT CHEST WITHOUT CONTRAST TECHNIQUE: Multidetector CT imaging of the chest was performed following the standard protocol without IV contrast. COMPARISON:  Chest x-ray from same day. High-resolution chest CT dated October 10, 2011. FINDINGS: Cardiovascular: Normal heart size. No pericardial effusion. No thoracic aortic aneurysm. Coronary, aortic arch, and branch vessel atherosclerotic vascular disease. Normal caliber pulmonary arteries. Mediastinum/Nodes: No  enlarged mediastinal or axillary lymph nodes. Thyroid gland, trachea, and esophagus demonstrate no significant findings. Lungs/Pleura: Trace bilateral pleural effusions. Chronic atelectasis at the right lung base due to elevation of the right hemidiaphragm. Minimal subsegmental atelectasis in the left lower lobe. No focal consolidation, pleural effusion, or pneumothorax. No suspicious pulmonary nodule. Upper Abdomen: No acute abnormality. Hepatic steatosis. Prior cholecystectomy. Musculoskeletal: Bilateral gynecomastia. 1.5 cm nodule in the anterior lower chest wall just deep to the skin surface, likely a sebaceous cyst. No acute or significant osseous findings. Unchanged small bone island in the T1 vertebral body. IMPRESSION: 1. Trace bilateral pleural effusions. 2. Chronic right lung base atelectasis due to elevated right hemidiaphragm. 3. Hepatic steatosis. 4.  Aortic atherosclerosis (ICD10-I70.0). Electronically Signed   By: Titus Dubin M.D.   On: 12/02/2018 13:20   Nm Pulmonary Perfusion  Result Date: 12/02/2018 CLINICAL DATA:  78 year old male with cough and shortness of breath. Positive D-dimer. EXAM: NUCLEAR MEDICINE PERFUSION SCAN TECHNIQUE: Perfusion images were obtained in multiple projections after intravenous injection of radiopharmaceutical. Ventilation images were not obtained in accordance with pandemic protocol. RADIOPHARMACEUTICALS:  4.2 mCi [id number] COMPARISON:  Noncontrast chest CT earlier today. FINDINGS: Ventilation: Not performed. Perfusion: Normal, homogeneous perfusion radiotracer activity throughout both lungs. No perfusion defect identified. IMPRESSION: Homogeneous pulmonary perfusion, no evidence of pulmonary embolus. Electronically Signed   By: HGenevie AnnM.D.   On: 12/02/2018 20:16   Portable Chest 1 View  Result Date: 12/03/2018 CLINICAL DATA:  Sepsis.  Cough. EXAM: PORTABLE CHEST 1 VIEW COMPARISON:  One-view chest x-ray and CT chest 12/02/2018 FINDINGS: The heart is  enlarged, exaggerated by low lung volumes. Chronic elevation of the right hemidiaphragm is again noted. There is no edema or effusion. No significant airspace consolidation is present. Aeration is slightly improved. Lung volumes remain low. IMPRESSION: 1. Slight improved aeration with persistent low lung volumes. 2. Chronic elevation of the right hemidiaphragm. Electronically Signed   By: CSan MorelleM.D.   On: 12/03/2018 07:01   Dg  Chest Portable 1 View  Result Date: 12/02/2018 CLINICAL DATA:  Pt brought in by EMS due to weakness, ams and fever. Wife reported to EMS that pt had been weak for several days, altered last nigh (not acting himself), not walking well and fever 101.4. Noted to have a cough CBG 134 bp 140/80 s.*comment was truncated*fever, cough EXAM: PORTABLE CHEST 1 VIEW COMPARISON:  Radiograph [id number] FINDINGS: Stable cardiac silhouette. Low lung volumes. RIGHT pleural effusion unchanged. No overt pulmonary edema. IMPRESSION: No significant change. Low lung volumes and moderate RIGHT effusion. Electronically Signed   By: Suzy Bouchard M.D.   On: 12/02/2018 10:14      Subjective: Pt says that he is feeling better today.  He is alert and much less confused, He is oriented to person, place and says he wants to go home.  He is eating and drinking.    Discharge Exam: Vitals:   12/05/18 0547 12/05/18 0803  BP: 140/68   Pulse: 81   Resp: 18   Temp: 98.2 F (36.8 C)   SpO2: 98% 95%   Vitals:   12/04/18 1955 12/04/18 2125 12/05/18 0547 12/05/18 0803  BP:  (!) 151/84 140/68   Pulse:  81 81   Resp:  20 18   Temp:  98.8 F (37.1 C) 98.2 F (36.8 C)   TempSrc:  Oral Oral   SpO2: 98% 97% 98% 95%  Weight:      Height:       General: Pt is alert, awake, not in acute distress Cardiovascular: RRR, S1/S2 +, no rubs, no gallops Respiratory: CTA bilaterally, no wheezing, no rhonchi Abdominal: Soft, NT, ND, bowel sounds + Extremities: no edema, no cyanosis   The results of  significant diagnostics from this hospitalization (including imaging, microbiology, ancillary and laboratory) are listed below for reference.     Microbiology: Recent Results (from the past 240 hour(s))  Culture, blood (Routine X 2) w Reflex to ID Panel     Status: None (Preliminary result)   Collection Time: 12/02/18  9:08 AM  Result Value Ref Range Status   Specimen Description BLOOD LEFT FOREARM  Final   Special Requests   Final    BOTTLES DRAWN AEROBIC ONLY Blood Culture results may not be optimal due to an inadequate volume of blood received in culture bottles   Culture   Final    NO GROWTH 3 DAYS Performed at [facility], [address], [place], [place] [address]    Report Status PENDING  Incomplete  SARS Coronavirus 2 [facility] order, Performed in [place] hospital lab)     Status: None   Collection Time: 12/02/18  9:27 AM  Result Value Ref Range Status   SARS Coronavirus 2 NEGATIVE NEGATIVE Final    Comment: (NOTE) If result is NEGATIVE SARS-CoV-2 target nucleic acids are NOT DETECTED. The SARS-CoV-2 RNA is generally detectable in upper and lower  respiratory specimens during the acute phase of infection. The lowest  concentration of SARS-CoV-2 viral copies this assay can detect is 250  copies / mL. A negative result does not preclude SARS-CoV-2 infection  and should not be used as the sole basis for treatment or other  patient management decisions.  A negative result may occur with  improper specimen collection / handling, submission of specimen other  than nasopharyngeal swab, presence of viral mutation(s) within the  areas targeted by this assay, and inadequate number of viral copies  (<250 copies / mL). A negative result must be combined with  clinical  observations, patient history, and epidemiological information. If result is POSITIVE SARS-CoV-2 target nucleic acids are DETECTED. The SARS-CoV-2 RNA is generally detectable in upper and lower  respiratory  specimens dur ing the acute phase of infection.  Positive  results are indicative of active infection with SARS-CoV-2.  Clinical  correlation with patient history and other diagnostic information is  necessary to determine patient infection status.  Positive results do  not rule out bacterial infection or co-infection with other viruses. If result is PRESUMPTIVE POSTIVE SARS-CoV-2 nucleic acids MAY BE PRESENT.   A presumptive positive result was obtained on the submitted specimen  and confirmed on repeat testing.  While 2019 novel coronavirus  (SARS-CoV-2) nucleic acids may be present in the submitted sample  additional confirmatory testing may be necessary for epidemiological  and / or clinical management purposes  to differentiate between  SARS-CoV-2 and other Sarbecovirus currently known to infect humans.  If clinically indicated additional testing with an alternate test  methodology [phone]) is advised. The SARS-CoV-2 RNA is generally  detectable in upper and lower respiratory sp ecimens during the acute  phase of infection. The expected result is Negative. Fact Sheet for Patients:  StrictlyIdeas.no Fact Sheet for Healthcare Providers: BankingDealers.co.za This test is not yet approved or cleared by the [place] FDA and has been authorized for detection and/or diagnosis of SARS-CoV-2 by FDA under an Emergency Use Authorization (EUA).  This EUA will remain in effect (meaning this test can be used) for the duration of the COVID-19 declaration under Section 564(b)(1) of the Act, 21 U.S.C. section 360bbb-3(b)(1), unless the authorization is terminated or revoked sooner. Performed at [facility], [address] [place], [place] [address]   Urine culture     Status: Abnormal   Collection Time: 12/02/18  9:46 AM  Result Value Ref Range Status   Specimen Description   Final    URINE, RANDOM Performed at [facility], [address] [place], [place] [address]    Special Requests   Final    NONE Performed at [facility], [address], [place], [place] [address]    Culture (A)  Final    <10,000 COLONIES/mL INSIGNIFICANT GROWTH Performed at [facility] [address], [place], [place] [address]    Report Status 12/03/2018 FINAL  Final  Culture, blood (x 2)     Status: None (Preliminary result)   Collection Time: 12/02/18  3:22 PM  Result Value Ref Range Status   Specimen Description BLOOD LEFT WRIST  Final   Special Requests   Final    BOTTLES DRAWN AEROBIC AND ANAEROBIC Blood Culture adequate volume   Culture   Final    NO GROWTH 3 DAYS Performed at [facility], [address], [place], [place] [address]    Report Status PENDING  Incomplete     Labs: BNP (last 3 results) Recent Labs    12/04/18 0252  BNP 494.4*   Basic Metabolic Panel: Recent Labs  Lab 12/02/18 0927 12/03/18 0434 12/04/18 0252 12/05/18 0518  NA 138 140 138  --   K 6.1* 4.8 4.0  --   CL 111 114* 110  --   CO2 19* 20* 21*  --   GLUCOSE 132* 172* 196*  --   BUN 32* 30* 23  --   CREATININE 1.90* 1.68* 1.41*  1.39*  --   CALCIUM 8.4* 8.2* 8.0*  --   MG  --  1.8 1.6* 1.5*  PHOS  --   --  2.1*  --    Liver Function Tests: Recent Labs  Lab 12/02/18 0927 12/03/18 0434 12/04/18 0252  AST 26 22  --   ALT 48* 40  --   ALKPHOS 161* 136*  --   BILITOT 0.4 0.4  --   PROT 6.3* 5.8*  --   ALBUMIN 2.9* 2.6* 2.6*   No results for input(s): LIPASE, AMYLASE in the last 168 hours. No results for input(s): AMMONIA in the last 168 hours. CBC: Recent Labs  Lab 12/02/18 1023 12/03/18 0434 12/04/18 0252 12/05/18 0518  WBC 8.6 7.5 6.6 6.4  NEUTROABS 6.3 5.3 4.6 3.8  HGB 11.0* 9.8* 9.7* 10.5*  HCT 36.5* 32.8* 32.0* 33.2*  MCV 99.7 102.2* 99.7 94.9  PLT 178 146* 137* 172   Cardiac Enzymes: No results for input(s): CKTOTAL, CKMB, CKMBINDEX, TROPONINI in the last 168 hours. BNP: Invalid input(s): POCBNP CBG: Recent  Labs  Lab 12/04/18 1252 12/04/18 1619 12/04/18 2121 12/05/18 0545 12/05/18 0826  GLUCAP 244* 176* 186* 148* 138*   D-Dimer No results for input(s): DDIMER in the last 72 hours. Hgb A1c Recent Labs    12/03/18 0434  HGBA1C 7.8*   Lipid Profile No results for input(s): CHOL, HDL, LDLCALC, TRIG, CHOLHDL, LDLDIRECT in the last 72 hours. Thyroid function studies Recent Labs    12/02/18 1522  TSH 1.955   Anemia work up No results for input(s): VITAMINB12, FOLATE, FERRITIN, TIBC, IRON, RETICCTPCT in the last 72 hours. Urinalysis    Component Value Date/Time   COLORURINE YELLOW 12/02/2018 0946   APPEARANCEUR CLEAR 12/02/2018 0946   LABSPEC 1.014 12/02/2018 0946   PHURINE 5.0 12/02/2018 0946   GLUCOSEU NEGATIVE 12/02/2018 0946   HGBUR NEGATIVE 12/02/2018 0946   BILIRUBINUR NEGATIVE 12/02/2018 0946   KETONESUR NEGATIVE 12/02/2018 0946   PROTEINUR NEGATIVE 12/02/2018 0946   UROBILINOGEN 0.2 03/20/2013 2016   NITRITE NEGATIVE 12/02/2018 0946   LEUKOCYTESUR NEGATIVE 12/02/2018 0946   Sepsis Labs Invalid input(s): PROCALCITONIN,  WBC,  LACTICIDVEN Microbiology Recent Results (from the past 240 hour(s))  Culture, blood (Routine X 2) w Reflex to ID Panel     Status: None (Preliminary result)   Collection Time: 12/02/18  9:08 AM  Result Value Ref Range Status   Specimen Description BLOOD LEFT FOREARM  Final   Special Requests   Final    BOTTLES DRAWN AEROBIC ONLY Blood Culture results may not be optimal due to an inadequate volume of blood received in culture bottles   Culture   Final    NO GROWTH 3 DAYS Performed at [facility], [address], [place], [place] [address]    Report Status PENDING  Incomplete  SARS Coronavirus 2 [facility] order, Performed in [place] hospital lab)     Status: None   Collection Time: 12/02/18  9:27 AM  Result Value Ref Range Status   SARS Coronavirus 2 NEGATIVE NEGATIVE Final    Comment: (NOTE) If result is NEGATIVE SARS-CoV-2 target  nucleic acids are NOT DETECTED. The SARS-CoV-2 RNA is generally detectable in upper and lower  respiratory specimens during the acute phase of infection. The lowest  concentration of SARS-CoV-2 viral copies this assay can detect is 250  copies / mL. A negative result does not preclude SARS-CoV-2 infection  and should not be used as the sole basis for treatment or other  patient management decisions.  A negative result may occur with  improper specimen collection / handling, submission of specimen other  than nasopharyngeal swab, presence of viral  mutation(s) within the  areas targeted by this assay, and inadequate number of viral copies  (<250 copies / mL). A negative result must be combined with clinical  observations, patient history, and epidemiological information. If result is POSITIVE SARS-CoV-2 target nucleic acids are DETECTED. The SARS-CoV-2 RNA is generally detectable in upper and lower  respiratory specimens dur ing the acute phase of infection.  Positive  results are indicative of active infection with SARS-CoV-2.  Clinical  correlation with patient history and other diagnostic information is  necessary to determine patient infection status.  Positive results do  not rule out bacterial infection or co-infection with other viruses. If result is PRESUMPTIVE POSTIVE SARS-CoV-2 nucleic acids MAY BE PRESENT.   A presumptive positive result was obtained on the submitted specimen  and confirmed on repeat testing.  While 2019 novel coronavirus  (SARS-CoV-2) nucleic acids may be present in the submitted sample  additional confirmatory testing may be necessary for epidemiological  and / or clinical management purposes  to differentiate between  SARS-CoV-2 and other Sarbecovirus currently known to infect humans.  If clinically indicated additional testing with an alternate test  methodology [phone]) is advised. The SARS-CoV-2 RNA is generally  detectable in upper and lower  respiratory sp ecimens during the acute  phase of infection. The expected result is Negative. Fact Sheet for Patients:  StrictlyIdeas.no Fact Sheet for Healthcare Providers: BankingDealers.co.za This test is not yet approved or cleared by the [place] FDA and has been authorized for detection and/or diagnosis of SARS-CoV-2 by FDA under an Emergency Use Authorization (EUA).  This EUA will remain in effect (meaning this test can be used) for the duration of the COVID-19 declaration under Section 564(b)(1) of the Act, 21 U.S.C. section 360bbb-3(b)(1), unless the authorization is terminated or revoked sooner. Performed at [facility], [address] [place], [place] [address]   Urine culture     Status: Abnormal   Collection Time: 12/02/18  9:46 AM  Result Value Ref Range Status   Specimen Description   Final    URINE, RANDOM Performed at [facility], [address] [place], [place] [address]    Special Requests   Final    NONE Performed at [facility], [address] [place], [place] [address]    Culture (A)  Final    <10,000 COLONIES/mL INSIGNIFICANT GROWTH Performed at [facility] [address] [place], [place] [address]    Report Status 12/03/2018 FINAL  Final  Culture, blood (x 2)     Status: None (Preliminary result)   Collection Time: 12/02/18  3:22 PM  Result Value Ref Range Status   Specimen Description BLOOD LEFT WRIST  Final   Special Requests   Final    BOTTLES DRAWN AEROBIC AND ANAEROBIC Blood Culture adequate volume   Culture   Final    NO GROWTH 3 DAYS Performed at [facility], [address], [place], [place] [address]    Report Status PENDING  Incomplete   Time coordinating discharge: 33 minutes   SIGNED:  Irwin Brakeman, MD  [facility] 12/05/2018, 11:16 AM How to contact the [facility] Attending or Consulting provider [place] or covering provider during after hours [place], for this  patient?  1. Check the care team in [facility] and look for a) attending/consulting TRH provider listed and b) the [facility] team listed 2. Log into [email or web address] and use [place]'s universal password to access. If you do not have the password, please contact the  hospital operator. 3. Locate the [facility] provider you are looking for under [facility] and page to a number that you can be directly reached. 4. If you still have difficulty reaching the provider, please page the [facility] (Director on Call) for the Hospitalists listed on amion for assistance.

## 2018-12-05 NOTE — TOC Transition Note (Signed)
Transition of Care (TOC) - CM/SW Discharge Note   Patient Details  Name: Mark TEARE Sr. MRN: [id number] Date of Birth: May 05, 1941  Transition of Care [facility]) CM/SW Contact:  Latanya Maudlin, RN Phone Number: 12/05/2018, 11:30 AM   Clinical Narrative:  Patient to be discharged per MD order. Orders in place for home health services. Previous workup for [facility] with [facility]. Notified Corene Cornea of discharge plans.No DME needs. Family to transport.     Final next level of care: Home w Home Health Services Barriers to Discharge: No Barriers Identified   Patient Goals and CMS Choice   CMS Medicare.gov Compare Post Acute Care list provided to:: Patient Choice offered to / list presented to : Spouse  Discharge Placement                       Discharge Plan and Services In-house Referral: Clinical Social Work Discharge Planning Services: CM Consult Post Acute Care Choice: Home Health                    HH Arranged: PT [facility] Agency: [place] ([facility]) Date HH Agency Contacted: 12/05/18 Time HH Agency Contacted: 1130 Representative spoke with at [place]: [place] ([place]) Interventions     Readmission Risk Interventions Readmission Risk Prevention Plan 12/04/2018  Transportation Screening Complete  PCP or Specialist Appt within 3-5 Days (No Data)  [place] or [place] Complete  Social Work Consult for [place] Planning/Counseling [place] Not Applicable  Medication Review Press photographer) Complete  Some recent data might be hidden

## 2018-12-06 DIAGNOSIS — I129 Hypertensive chronic kidney disease with stage 1 through stage 4 chronic kidney disease, or unspecified chronic kidney disease: Secondary | ICD-10-CM | POA: Diagnosis not present

## 2018-12-06 DIAGNOSIS — N189 Chronic kidney disease, unspecified: Secondary | ICD-10-CM | POA: Diagnosis not present

## 2018-12-06 DIAGNOSIS — E1165 Type 2 diabetes mellitus with hyperglycemia: Secondary | ICD-10-CM | POA: Diagnosis not present

## 2018-12-06 DIAGNOSIS — Z794 Long term (current) use of insulin: Secondary | ICD-10-CM | POA: Diagnosis not present

## 2018-12-06 DIAGNOSIS — E113211 Type 2 diabetes mellitus with mild nonproliferative diabetic retinopathy with macular edema, right eye: Secondary | ICD-10-CM | POA: Diagnosis not present

## 2018-12-06 DIAGNOSIS — E1140 Type 2 diabetes mellitus with diabetic neuropathy, unspecified: Secondary | ICD-10-CM | POA: Diagnosis not present

## 2018-12-06 DIAGNOSIS — I2510 Atherosclerotic heart disease of native coronary artery without angina pectoris: Secondary | ICD-10-CM | POA: Diagnosis not present

## 2018-12-07 ENCOUNTER — Encounter: Payer: Self-pay | Admitting: Family Medicine

## 2018-12-07 LAB — CULTURE, BLOOD (ROUTINE X 2)
Culture: NO GROWTH
Culture: NO GROWTH
Special Requests: ADEQUATE

## 2018-12-08 ENCOUNTER — Other Ambulatory Visit (HOSPITAL_COMMUNITY)
Admission: RE | Admit: 2018-12-08 | Discharge: 2018-12-08 | Disposition: A | Discharge status: Home / Self Care | Payer: PPO | Source: Ambulatory Visit | Attending: Family Medicine | Admitting: Family Medicine

## 2018-12-08 ENCOUNTER — Other Ambulatory Visit: Payer: Self-pay | Admitting: Family Medicine

## 2018-12-08 DIAGNOSIS — I2510 Atherosclerotic heart disease of native coronary artery without angina pectoris: Secondary | ICD-10-CM | POA: Diagnosis not present

## 2018-12-08 DIAGNOSIS — I129 Hypertensive chronic kidney disease with stage 1 through stage 4 chronic kidney disease, or unspecified chronic kidney disease: Secondary | ICD-10-CM | POA: Diagnosis not present

## 2018-12-08 DIAGNOSIS — E1140 Type 2 diabetes mellitus with diabetic neuropathy, unspecified: Secondary | ICD-10-CM | POA: Diagnosis not present

## 2018-12-08 DIAGNOSIS — Z794 Long term (current) use of insulin: Secondary | ICD-10-CM | POA: Diagnosis not present

## 2018-12-08 DIAGNOSIS — E119 Type 2 diabetes mellitus without complications: Secondary | ICD-10-CM | POA: Insufficient documentation

## 2018-12-08 DIAGNOSIS — E113211 Type 2 diabetes mellitus with mild nonproliferative diabetic retinopathy with macular edema, right eye: Secondary | ICD-10-CM | POA: Diagnosis not present

## 2018-12-08 DIAGNOSIS — N189 Chronic kidney disease, unspecified: Secondary | ICD-10-CM | POA: Diagnosis not present

## 2018-12-08 DIAGNOSIS — E1165 Type 2 diabetes mellitus with hyperglycemia: Secondary | ICD-10-CM | POA: Diagnosis not present

## 2018-12-08 LAB — CBC WITH DIFFERENTIAL/PLATELET
Abs Immature Granulocytes: 0.03 10*3/uL (ref 0.00–0.07)
Basophils Absolute: 0 10*3/uL (ref 0.0–0.1)
Basophils Relative: 1 %
Eosinophils Absolute: 0.3 10*3/uL (ref 0.0–0.5)
Eosinophils Relative: 5 %
HCT: 36.7 % — ABNORMAL LOW (ref 39.0–52.0)
Hemoglobin: 11.6 g/dL — ABNORMAL LOW (ref 13.0–17.0)
Immature Granulocytes: 1 %
Lymphocytes Relative: 25 %
Lymphs Abs: 1.6 10*3/uL (ref 0.7–4.0)
MCH: 30.2 pg (ref 26.0–34.0)
MCHC: 31.6 g/dL (ref 30.0–36.0)
MCV: 95.6 fL (ref 80.0–100.0)
Monocytes Absolute: 0.5 10*3/uL (ref 0.1–1.0)
Monocytes Relative: 9 %
Neutro Abs: 3.8 10*3/uL (ref 1.7–7.7)
Neutrophils Relative %: 59 %
Platelets: 209 10*3/uL (ref 150–400)
RBC: 3.84 MIL/uL — ABNORMAL LOW (ref 4.22–5.81)
RDW: 13.7 % (ref 11.5–15.5)
WBC: 6.3 10*3/uL (ref 4.0–10.5)
nRBC: 0 % (ref 0.0–0.2)

## 2018-12-08 LAB — HEMOGLOBIN A1C
Hgb A1c MFr Bld: 7.8 % — ABNORMAL HIGH (ref 4.8–5.6)
Mean Plasma Glucose: 177.16 mg/dL

## 2018-12-08 LAB — BASIC METABOLIC PANEL
Anion gap: 10 (ref 5–15)
BUN: 33 mg/dL — ABNORMAL HIGH (ref 8–23)
CO2: 23 mmol/L (ref 22–32)
Calcium: 8.6 mg/dL — ABNORMAL LOW (ref 8.9–10.3)
Chloride: 105 mmol/L (ref 98–111)
Creatinine, Ser: 1.53 mg/dL — ABNORMAL HIGH (ref 0.61–1.24)
GFR calc Af Amer: 50 mL/min — ABNORMAL LOW (ref >60)
GFR calc non Af Amer: 43 mL/min — ABNORMAL LOW (ref >60)
Glucose, Bld: 234 mg/dL — ABNORMAL HIGH (ref 70–99)
Potassium: 4.3 mmol/L (ref 3.5–5.1)
Sodium: 138 mmol/L (ref 135–145)

## 2018-12-08 MED ORDER — OLOPATADINE HCL 0.1 % OP SOLN: 1.0000 [drp] | Freq: Two times a day (BID) | OPHTHALMIC | 12 refills | Status: DC

## 2018-12-09 DIAGNOSIS — E1142 Type 2 diabetes mellitus with diabetic polyneuropathy: Secondary | ICD-10-CM | POA: Diagnosis not present

## 2018-12-09 DIAGNOSIS — N183 Chronic kidney disease, stage 3 (moderate): Secondary | ICD-10-CM | POA: Diagnosis not present

## 2018-12-09 DIAGNOSIS — Z794 Long term (current) use of insulin: Secondary | ICD-10-CM | POA: Diagnosis not present

## 2018-12-09 DIAGNOSIS — E1122 Type 2 diabetes mellitus with diabetic chronic kidney disease: Secondary | ICD-10-CM | POA: Diagnosis not present

## 2018-12-09 DIAGNOSIS — E1159 Type 2 diabetes mellitus with other circulatory complications: Secondary | ICD-10-CM | POA: Diagnosis not present

## 2018-12-09 DIAGNOSIS — E113293 Type 2 diabetes mellitus with mild nonproliferative diabetic retinopathy without macular edema, bilateral: Secondary | ICD-10-CM | POA: Diagnosis not present

## 2018-12-09 DIAGNOSIS — E1165 Type 2 diabetes mellitus with hyperglycemia: Secondary | ICD-10-CM | POA: Diagnosis not present

## 2018-12-10 ENCOUNTER — Other Ambulatory Visit: Payer: Self-pay | Admitting: Family Medicine

## 2018-12-10 ENCOUNTER — Ambulatory Visit (INDEPENDENT_AMBULATORY_CARE_PROVIDER_SITE_OTHER): Payer: PPO | Admitting: Family Medicine

## 2018-12-10 ENCOUNTER — Telehealth: Payer: Self-pay | Admitting: Family Medicine

## 2018-12-10 ENCOUNTER — Encounter: Payer: Self-pay | Admitting: Family Medicine

## 2018-12-10 ENCOUNTER — Other Ambulatory Visit: Payer: Self-pay

## 2018-12-10 VITALS — BP 140/78 | HR 82 | Temp 98.2°F | Resp 16 | Ht 69.0 in | Wt 198.0 lb

## 2018-12-10 DIAGNOSIS — Z09 Encounter for follow-up examination after completed treatment for conditions other than malignant neoplasm: Secondary | ICD-10-CM

## 2018-12-10 DIAGNOSIS — I10 Essential (primary) hypertension: Secondary | ICD-10-CM | POA: Diagnosis not present

## 2018-12-10 MED ORDER — PREDNISOLONE ACETATE 1 % OP SUSP: 1.0000 [drp] | Freq: Four times a day (QID) | OPHTHALMIC | 0 refills | Status: DC

## 2018-12-10 MED ORDER — GENTAMICIN-PREDNISOLONE ACET 0.3-1 % OP SUSP: 1.0000 [drp] | Freq: Two times a day (BID) | OPHTHALMIC | 0 refills | Status: DC

## 2018-12-10 NOTE — Telephone Encounter (Signed)
I sent in pred forte is that any cheaper

## 2018-12-10 NOTE — Telephone Encounter (Signed)
Insurance will not cover antibx eye drop - it is 194$ - did not say what it would cover.

## 2018-12-10 NOTE — Progress Notes (Signed)
Subjective:    Patient ID: [name]., male    DOB: September 01, 1940, 78 y.o.   MRN: [id number]  HPI   Admit date: 12/02/2018 Discharge date: 12/05/2018  Admitted From:  Home  Disposition: Home   Recommendations for Outpatient Follow-up:  1. Follow up with PCP in 1 weeks 2. Please obtain BMP/CBC in 1-2 weeks  Home Health: PT, RN  Discharge Condition: STABLE   CODE STATUS: FULL    Brief Hospitalization Summary: Please see all hospital notes, images, labs for full details of the hospitalization. IPJ:ASNKNL L Darley Sr.is a 78 y.o.malewith chronic back pain on oxycodone tablets at home, chronic kidney disease, type 2 diabetes mellitus on insulin, diabetic complications including retinopathy and nephropathy, essential hypertension, diastolic heart dysfunction, coronary artery disease who was brought into the emergency department by EMS with complaints of 1 day of increasing weakness, fever and altered mentation. Patient had a fever greater than 101 at home according to his wife. They apparently have been monitoring his blood glucose readings at home and they have been in the low 100 range. He has not had any changes in his medications or activities. He has had episodes of confusion associated with his home medications in the past. He is followed by his primary care provider for that. The patient apparently does continue to take oxycodone. He had been taken off Valium by his primary care provider. On arrival he was noted to be hypoxic with a pulse ox of 82% and was given supplemental oxygen with improvement to 95%. The patient denied headache and visual changes. There was no nuchal rigidity. His chest x-ray was unremarkable for any acute findings. His metabolic panel was positive for hyperkalemia with a sodium of 6.1. His creatinine was noted to be 1.90. His d-dimer was elevated at 0.75. His white blood cell count was 8.6. Hemoglobin was 11.0, platelet count 178.  Lactic acid 1.4. Procalcitonin 0.10. Blood glucose 132. His urinalysis was unremarkable with no signs of infection found. His EKG showed signs of tachycardia and hyperkalemia with abnormal R wave progression and slight peaked T waves. Sed rate was 11. He did have COVID-19 testing done in the ED that was negative. Blood cultures obtained and urine cultures obtained. The patient was started on broad-spectrum antibiotic therapy and admission was requested. His vital signs have been stable with a heart rate of 102, blood pressure 97/66, initial temperature 102.2 improved to 100.1 after Tylenol administration. The patient remained 99% oxygen saturation on 1 L nasal cannula.  Brief Admission Hx: 78 y/o male admitted with altered mental status, fever and dehydration.  MDM/Assessment & Plan:  1. URI -COVID testing has been negative. Repeat chest x-ray does not show any signs of pneumonia. He is coughing and wheezing and likely has a viral URI. He has been started on nebulizer treatments for wheezing and coughing and he has iimproved. He is also treated with course of doxycycline.  2. Acute delirium - patient had some sundowning. Pt had CT head no acute findings. He is back to his baseline today.   3. Altered mental state-likely was related to fever-he seems to have improved to his baseline and he is oriented x3 at this time. His fever has resolved. 4. Fever-RESOLVED. His temperature has come down with supportive therapy. Suspect this was related to upper respiratory illness. Continue Tylenol as needed. 5. Hyperkalemia-this has been treated and now resolved. His losartan has been discontinued. He received Lokelma and fluid hydration for treatment. 6. Uncontrolled type  2 diabetes mellitus with hyperglycemia and renal with neurological complications-patient was treated with sliding scale coverage in addition to Lantus and prandial insulin as ordered. Continue to monitor CBGs  closely. 7. Essential hypertension-his blood pressure seems to be controlled at this time we will continue to follow. 8. Pancreatic insufficiency-I spoke with wife and this is chronic for him he has diarrhea from this and is treated with enzymes according to his wife. We have resumed his home enzymes. 9. Acute on chronic renal failure-he has had some improvement with gentle hydration. 10. Elevated d-dimer-VQ scan negative for PE. 11. Mixed hyperlipidemia-resumed home pravastatin nightly.  Patient was recently admitted to the hospital.  Above is a copy of his discharge summary.  He is here today for follow-up.  Since discharge in the hospital he is had no additional fevers.  His mental status is back to his baseline.  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  I performed a Mini-Mental status exam today.  Patient can easily tell me the date and location.  He remembers 3 out of 3 objects on recall.  He performs serial sevens and gets 5 out of 5 correct although it does take him longer than expected.  He has a difficult time spelling world backwards.  On clock drawing, he draws the clock correctly as well as the correct time however he writes the numbers on the outside of the clock face rather than the inside of the clock face.  I believe he has mild dementia.  In fact I believe it so mild it does not benefit from Aricept at the present time however had a long discussion with the patient and his wife about avoiding medications that can exacerbate confusion such as Benadryl and pain medication.  I recommended that he use these medicines sparingly and per tickly avoid them when he is acutely ill.  I believe his confusion in the hospital was delirium likely due to his dementia coupled with his fever coupled with the lack of his hearing aid coupled with the fact that his wife cannot be there.  I believe all the small factors contributed to his confusion.  He is back to his baseline.  His  blood pressure at home is excellent.  Today is 140/78 despite holding hydrochlorothiazide.  His lab work in the computer shows his creatinine is 1.5 with a GFR between 42 and 45 Past Medical History:  Diagnosis Date   Arthritis    lower back   Chronic back pain    Disc disease   Chronic kidney disease    Coronary atherosclerosis of native coronary artery    Coronary calcifications by chest CT, Myoview demonstrating inferolateral scar   Deafness in right ear    Diabetes mellitus    Diabetic retinopathy (HCC)    moderate nonproliferative with macular edema in right eye   Diastolic dysfunction 02/5101   Grade 1.   Essential hypertension, benign    Heart murmur    in past   HOH (hard of hearing)    Hyperkalemia    Kidney stones    Mixed hyperlipidemia    NAFLD (nonalcoholic fatty liver disease)    Pancreatic insufficiency    Diagnosed at [facility]   Prostate cancer [facility]) 2011   Shortness of breath dyspnea    occasional - liver pressing on right lung, decreased capacity   Subdural hematoma ([place])    Type 2 diabetes mellitus ([place])    Wears hearing aid    "  crossover" form right to left   Past Surgical History:  Procedure Laterality Date   APPENDECTOMY     Bilateral hip replacement     CATARACT EXTRACTION W/PHACO Left 05/15/2015   Procedure: CATARACT EXTRACTION PHACO AND INTRAOCULAR LENS PLACEMENT ([place]);  Surgeon: Ronnell Freshwater, MD;  Location: [place];  Service: Ophthalmology;  Laterality: Left;  DIABETIC - insulin pump and oral meds   CATARACT EXTRACTION W/PHACO Right 08/28/2015   Procedure: CATARACT EXTRACTION PHACO AND INTRAOCULAR LENS PLACEMENT ([facility]);  Surgeon: Ronnell Freshwater, MD;  Location: [place];  Service: Ophthalmology;  Laterality: Right;  DIABETIC PER PT AND CINDY PLEASE KEEP ARRIVAL TIME AFTER 8AM    CHOLECYSTECTOMY     HERNIA REPAIR     PROSTATECTOMY  2011   Current Outpatient Medications on File  Prior to Visit  Medication Sig Dispense Refill   acetaminophen (TYLENOL) 325 MG tablet Take 2 tablets (650 mg total) by mouth every 6 (six) hours as needed for mild pain (or Fever >/= 101).     albuterol (PROVENTIL HFA;VENTOLIN HFA) 108 (90 Base) MCG/ACT inhaler Inhale 2 puffs into the lungs every 4 (four) hours as needed for wheezing or shortness of breath. 1 Inhaler 0   amLODipine (NORVASC) 10 MG tablet Take 1 tablet (10 mg total) by mouth daily. 90 tablet 3   aspirin EC 81 MG tablet Take 81 mg by mouth at bedtime.      carvedilol (COREG) 25 MG tablet TAKE 1 TABLET(25 MG) BY MOUTH TWICE DAILY (Patient taking differently: Take 25 mg by mouth 2 (two) times daily with a meal. ) 180 tablet 3   doxazosin (CARDURA) 8 MG tablet TAKE 1 TABLET(8 MG) BY MOUTH DAILY (Patient taking differently: Take 8 mg by mouth daily. ) 90 tablet 3   feeding supplement, GLUCERNA SHAKE, (GLUCERNA SHAKE) LIQD Take 237 mLs by mouth 3 (three) times daily between meals for 14 days. 9954 mL 0   insulin NPH Human (HUMULIN N,NOVOLIN N) 100 UNIT/ML injection Inject 8-12 Units into the skin at bedtime. Take 12 units qam and 8 units qhs     insulin regular (NOVOLIN R,HUMULIN R) 100 units/mL injection Inject 8 Units into the skin 3 (three) times daily before meals.      losartan (COZAAR) 100 MG tablet Take 100 mg by mouth daily.      metFORMIN (GLUCOPHAGE) 500 MG tablet Take 1,000 mg by mouth 2 (two) times daily with a meal.     Multiple Vitamin (MULTIVITAMIN WITH MINERALS) TABS Take 1 tablet by mouth daily.     olopatadine (PATANOL) 0.1 % ophthalmic solution Place 1 drop into both eyes 2 (two) times daily. 5 mL 12   pravastatin (PRAVACHOL) 10 MG tablet TAKE 1 TABLET(10 MG) BY MOUTH DAILY WITH BREAKFAST (Patient taking differently: Take 10 mg by mouth daily. ) 90 tablet 0   ZENPEP 10000-32000 units CPEP TAKE 1 CAPSULE BY MOUTH THREE TIMES DAILY BEFORE MEALS (Patient taking differently: Take 1 capsule by mouth 3 (three)  times daily before meals. ) 180 capsule 3   No current facility-administered medications on file prior to visit.    Allergies  Allergen Reactions   Enalapril Maleate Cough   Social History   Socioeconomic History   Marital status: Married    Spouse name: Not on file   Number of children: Not on file   Years of education: Not on file   Highest education level: Not on file  Occupational History   Not  on file  Social Needs   Financial resource strain: Not on file   Food insecurity:    Worry: Not on file    Inability: Not on file   Transportation needs:    Medical: Not on file    Non-medical: Not on file  Tobacco Use   Smoking status: Never Smoker   Smokeless tobacco: Never Used  Substance and Sexual Activity   Alcohol use: No   Drug use: No   Sexual activity: Not on file  Lifestyle   Physical activity:    Days per week: Not on file    Minutes per session: Not on file   Stress: Not on file  Relationships   Social connections:    Talks on phone: Not on file    Gets together: Not on file    Attends religious service: Not on file    Active member of club or organization: Not on file    Attends meetings of clubs or organizations: Not on file    Relationship status: Not on file   Intimate partner violence:    Fear of current or ex partner: Not on file    Emotionally abused: Not on file    Physically abused: Not on file    Forced sexual activity: Not on file  Other Topics Concern   Not on file  Social History Narrative   Not on file     Review of Systems  All other systems reviewed and are negative.      Objective:   Physical Exam Vitals signs reviewed.  Constitutional:      General: He is awake.     Appearance: He is well-developed.  HENT:     Head: Normocephalic and atraumatic.  Eyes:     General: No visual field deficit. Cardiovascular:     Rate and Rhythm: Normal rate and regular rhythm.  Pulmonary:     Effort: Pulmonary effort  is normal. No respiratory distress.     Breath sounds: No stridor. No decreased breath sounds, wheezing, rhonchi or rales.  Neurological:     Mental Status: He is alert.     Cranial Nerves: No cranial nerve deficit, dysarthria or facial asymmetry.     Sensory: Sensation is intact.     Motor: Motor function is intact. No weakness, abnormal muscle tone or pronator drift.     Coordination: Romberg sign negative. Finger-Nose-Finger Test and Heel to [facility] Test normal.     Gait: Gait normal.     Deep Tendon Reflexes: Reflexes are normal and symmetric.           Assessment & Plan:  Hospital discharge follow-up  Mild dementia  Chronic kidney disease  Fever  Patient is back to his baseline.  I recommended avoiding Benadryl and pain medication unless absolutely necessary.  He does have allergies causing allergic conjunctivitis and Benadryl has not helped, Zyrtec does not help, Patanol is burning in his eyes.  Therefore I will place the patient on prednisolone eyedrops 2 drops each eye twice daily for 5 days and then once better I want him to switch to Patanol.  Mini-Mental status exam today is only mildly abnormal.  At the present time I recommended clinical monitoring.  If worsening, we may want to consider starting Aricept.  Of asked the wife to check his blood pressure daily at home and when his blood pressure rises greater than 140/90, we can decide between resuming hydrochlorothiazide versus replacing with another option for hypertension but at the  present time I see no indication to resume any medication beyond what he is already taking.  His chronic kidney disease is stable.

## 2018-12-14 ENCOUNTER — Other Ambulatory Visit: Payer: Self-pay

## 2018-12-14 ENCOUNTER — Ambulatory Visit: Payer: PPO | Admitting: Orthotics

## 2018-12-14 DIAGNOSIS — M79674 Pain in right toe(s): Secondary | ICD-10-CM

## 2018-12-14 DIAGNOSIS — B351 Tinea unguium: Secondary | ICD-10-CM

## 2018-12-14 DIAGNOSIS — E1142 Type 2 diabetes mellitus with diabetic polyneuropathy: Secondary | ICD-10-CM

## 2018-12-14 DIAGNOSIS — E1151 Type 2 diabetes mellitus with diabetic peripheral angiopathy without gangrene: Secondary | ICD-10-CM

## 2018-12-14 NOTE — Progress Notes (Signed)

## 2018-12-16 ENCOUNTER — Other Ambulatory Visit: Payer: Self-pay | Admitting: Family Medicine

## 2018-12-22 DIAGNOSIS — H6122 Impacted cerumen, left ear: Secondary | ICD-10-CM | POA: Diagnosis not present

## 2018-12-22 DIAGNOSIS — H903 Sensorineural hearing loss, bilateral: Secondary | ICD-10-CM | POA: Diagnosis not present

## 2018-12-23 ENCOUNTER — Encounter: Payer: Self-pay | Admitting: Family Medicine

## 2018-12-23 DIAGNOSIS — K7581 Nonalcoholic steatohepatitis (NASH): Secondary | ICD-10-CM

## 2018-12-24 MED ORDER — OXYCODONE HCL 5 MG PO TABS: 5.0000 mg | ORAL_TABLET | ORAL | 0 refills | Status: DC | PRN

## 2018-12-24 NOTE — Telephone Encounter (Addendum)
Patient is requesting a refill on Hydrocodone  - Should be Oxycodone   LOV: 12/10/18  LRF:  10/21/18

## 2019-01-02 ENCOUNTER — Other Ambulatory Visit: Payer: Self-pay | Admitting: Family Medicine

## 2019-01-05 DIAGNOSIS — H90A21 Sensorineural hearing loss, unilateral, right ear, with restricted hearing on the contralateral side: Secondary | ICD-10-CM | POA: Diagnosis not present

## 2019-01-05 DIAGNOSIS — H6122 Impacted cerumen, left ear: Secondary | ICD-10-CM | POA: Diagnosis not present

## 2019-01-05 DIAGNOSIS — H60392 Other infective otitis externa, left ear: Secondary | ICD-10-CM | POA: Diagnosis not present

## 2019-01-05 NOTE — Telephone Encounter (Signed)
Requested Prescriptions   Pending Prescriptions Disp Refills  . diazepam (VALIUM) 5 MG tablet [Pharmacy Med Name: DIAZEPAM 5MG TABLETS] 60 tablet     Sig: TAKE 1 TABLET BY MOUTH TWICE DAILY AS NEEDED FOR ANXIETY   Signed Prescriptions Disp Refills  . carvedilol (COREG) 25 MG tablet 90 tablet 3    Sig: Take 1 tablet (25 mg total) by mouth 2 (two) times daily with a meal.    Authorizing Provider: [name]    Ordering User: Vanice Sarah   Last OV 0507/2020 Last written 0227/2020

## 2019-01-07 ENCOUNTER — Telehealth: Payer: Self-pay | Admitting: Family Medicine

## 2019-01-07 NOTE — Telephone Encounter (Signed)
  Per Dr. Buelah Manis as Dr. Dennard Schaumann is out of office this week - As long as he is keeping hydrated, no new dizziness or weakness, these numbers are okay.  If you have any questions please do not hesitate to contact our office.   Have A Great Day! Lovey Newcomer   ----- Message -----  From: Christene Lye  Sent: 01/06/2019 12:41 PM EDT  To: [name], MD Subject: Non-Urgent Medical Question  In follow-up for [name]: You stopped Losartan and put him back on HCTZ due to swelling of legs and feet.Following are b/p's since.His swelling is better but not complete. 130/70 118/68 106/60 128/70 116/62 112/58 Just wondering if these are too low or are ok. Hope you are doing ok with all this mess!

## 2019-01-19 ENCOUNTER — Telehealth: Payer: Self-pay | Admitting: Podiatry

## 2019-01-19 NOTE — Telephone Encounter (Signed)
Pt left message checking to see if diabetic shoes are in..   I returned call and we had been faxing shoe paperwork to Dr Dennard Schaumann and they sent a note stating pt see's Dr Gabriel Carina for endocrinologist.   I explained that we were given wrong doctor but it has now been sent to Dr Gabriel Carina and when she signs off on paperwork they will make the inserts and I will call pt when shoes come in.

## 2019-01-20 ENCOUNTER — Ambulatory Visit: Payer: PPO | Admitting: Podiatry

## 2019-01-20 ENCOUNTER — Encounter: Payer: Self-pay | Admitting: Podiatry

## 2019-01-20 ENCOUNTER — Other Ambulatory Visit: Payer: Self-pay

## 2019-01-20 VITALS — Temp 97.9°F

## 2019-01-20 DIAGNOSIS — M79675 Pain in left toe(s): Secondary | ICD-10-CM | POA: Diagnosis not present

## 2019-01-20 DIAGNOSIS — L84 Corns and callosities: Secondary | ICD-10-CM

## 2019-01-20 DIAGNOSIS — E1142 Type 2 diabetes mellitus with diabetic polyneuropathy: Secondary | ICD-10-CM | POA: Diagnosis not present

## 2019-01-20 DIAGNOSIS — B351 Tinea unguium: Secondary | ICD-10-CM

## 2019-01-20 DIAGNOSIS — M79674 Pain in right toe(s): Secondary | ICD-10-CM

## 2019-01-20 NOTE — Patient Instructions (Signed)

## 2019-02-01 NOTE — Progress Notes (Signed)
Subjective:  Mark L Klabunde Sr. presents to clinic for preventative diabetic foot caretoday with cc of  painful, thick, discolored, elongated toenails 1-5 b/l that become tender and cannot cut because of thickness. Pain is aggravated when wearing enclosed shoe gear.  [name], MD is his PCP.   He states his legs are sore because he attempted to climb a step ladder to place batteries in his smoke detector.   Current Outpatient Medications:  .  acetaminophen (TYLENOL) 325 MG tablet, Take 2 tablets (650 mg total) by mouth every 6 (six) hours as needed for mild pain (or Fever >/= 101)., Disp: , Rfl:  .  albuterol (PROVENTIL HFA;VENTOLIN HFA) 108 (90 Base) MCG/ACT inhaler, Inhale 2 puffs into the lungs every 4 (four) hours as needed for wheezing or shortness of breath., Disp: 1 Inhaler, Rfl: 0 .  amLODipine (NORVASC) 10 MG tablet, Take 1 tablet (10 mg total) by mouth daily., Disp: 90 tablet, Rfl: 3 .  aspirin EC 81 MG tablet, Take 81 mg by mouth at bedtime. , Disp: , Rfl:  .  carvedilol (COREG) 25 MG tablet, Take 1 tablet (25 mg total) by mouth 2 (two) times daily with a meal., Disp: 90 tablet, Rfl: 3 .  diazepam (VALIUM) 5 MG tablet, TAKE 1 TABLET BY MOUTH TWICE DAILY AS NEEDED FOR ANXIETY, Disp: 60 tablet, Rfl: 1 .  doxazosin (CARDURA) 8 MG tablet, TAKE 1 TABLET(8 MG) BY MOUTH DAILY (Patient taking differently: Take 8 mg by mouth daily. ), Disp: 90 tablet, Rfl: 3 .  gentamicin-prednisoLONE 0.3-1 % ophthalmic drops, Place 1 drop into both eyes 2 (two) times daily., Disp: 5 mL, Rfl: 0 .  hydrochlorothiazide (HYDRODIURIL) 25 MG tablet, Take 25 mg by mouth daily., Disp: , Rfl:  .  insulin NPH Human (HUMULIN N,NOVOLIN N) 100 UNIT/ML injection, Inject 8-12 Units into the skin at bedtime. Take 12 units qam and 8 units qhs, Disp: , Rfl:  .  insulin regular (NOVOLIN R,HUMULIN R) 100 units/mL injection, Inject 8 Units into the skin 3 (three) times daily before meals. , Disp: , Rfl:  .  metFORMIN  (GLUCOPHAGE) 500 MG tablet, Take 1,000 mg by mouth 2 (two) times daily with a meal., Disp: , Rfl:  .  Multiple Vitamin (MULTIVITAMIN WITH MINERALS) TABS, Take 1 tablet by mouth daily., Disp: , Rfl:  .  olopatadine (PATANOL) 0.1 % ophthalmic solution, Place 1 drop into both eyes 2 (two) times daily., Disp: 5 mL, Rfl: 12 .  oxyCODONE (ROXICODONE) 5 MG immediate release tablet, Take 1 tablet (5 mg total) by mouth every 4 (four) hours as needed for severe pain., Disp: 60 tablet, Rfl: 0 .  pravastatin (PRAVACHOL) 10 MG tablet, TAKE 1 TABLET(10 MG) BY MOUTH DAILY WITH BREAKFAST, Disp: 90 tablet, Rfl: 3 .  prednisoLONE acetate (PRED FORTE) 1 % ophthalmic suspension, Place 1 drop into both eyes 4 (four) times daily., Disp: 5 mL, Rfl: 0 .  ZENPEP 10000-32000 units CPEP, TAKE 1 CAPSULE BY MOUTH THREE TIMES DAILY BEFORE MEALS (Patient taking differently: Take 1 capsule by mouth 3 (three) times daily before meals. ), Disp: 180 capsule, Rfl: 3   Allergies  Allergen Reactions  . Enalapril Maleate Cough     Objective: Vitals:   01/20/19 1400  Temp: 97.9 F (36.6 C)    Physical Examination:  Vascular Examination: Capillary refill time immediate x 10 digits.  DP pulses absent b/l.  PT pulses 2/4 b/l.  Digital hair absent b/l.  No edema noted  b/l.  Skin temperature gradient warm to cool b/l.  Dermatological Examination: Skin is thin, shiny and atrophic b/l.  No open wounds b/l.  No interdigital macerations noted b/l.  Elongated, thick, discolored brittle toenails with subungual debris and pain on dorsal palpation of nailbeds 1-5 b/l.  Hyperkeratotic lesion submet head 5 left foot with tenderness to palpation. No edema, no erythema, no drainage, no flocculence.  Musculoskeletal Examination: Muscle strength 5/5 to all muscle groups b/l  No pain, crepitus or joint discomfort with active/passive ROM.  Neurological Examination: Sensation absent b/l with 10 gram monofilament.  Vibratory  sensation absent b/l.  Assessment: Mycotic nail infection with pain 1-5 b/l Callus submet head 5 left foot NIDDM with neuropathy  Plan: 1. Toenails 1-5 b/l were debrided in length and girth without iatrogenic laceration. 2. Calluses pared submetatarsal head(s) 5 left foot utilizing sterile scalpel blade without incident. Continue soft, supportive shoe gear daily. Report any pedal injuries to medical professional. Counseled patient on dangers of him being on a step ladder and risk for potential fall/injury. Encouraged him to solicit family members to assist him. He related understanding. Follow up 3 months. Patient/POA to call should there be a question/concern in there interim.

## 2019-02-03 DIAGNOSIS — E1159 Type 2 diabetes mellitus with other circulatory complications: Secondary | ICD-10-CM | POA: Diagnosis not present

## 2019-02-03 DIAGNOSIS — E1142 Type 2 diabetes mellitus with diabetic polyneuropathy: Secondary | ICD-10-CM | POA: Diagnosis not present

## 2019-02-03 DIAGNOSIS — N183 Chronic kidney disease, stage 3 (moderate): Secondary | ICD-10-CM | POA: Diagnosis not present

## 2019-02-03 DIAGNOSIS — E113293 Type 2 diabetes mellitus with mild nonproliferative diabetic retinopathy without macular edema, bilateral: Secondary | ICD-10-CM | POA: Diagnosis not present

## 2019-02-03 DIAGNOSIS — E1122 Type 2 diabetes mellitus with diabetic chronic kidney disease: Secondary | ICD-10-CM | POA: Diagnosis not present

## 2019-02-03 DIAGNOSIS — Z794 Long term (current) use of insulin: Secondary | ICD-10-CM | POA: Diagnosis not present

## 2019-02-03 DIAGNOSIS — E1165 Type 2 diabetes mellitus with hyperglycemia: Secondary | ICD-10-CM | POA: Diagnosis not present

## 2019-02-09 DIAGNOSIS — E113313 Type 2 diabetes mellitus with moderate nonproliferative diabetic retinopathy with macular edema, bilateral: Secondary | ICD-10-CM | POA: Diagnosis not present

## 2019-02-10 ENCOUNTER — Encounter: Payer: Self-pay | Admitting: Family Medicine

## 2019-02-10 DIAGNOSIS — K7581 Nonalcoholic steatohepatitis (NASH): Secondary | ICD-10-CM

## 2019-02-11 MED ORDER — OXYCODONE HCL 5 MG PO TABS: 5.0000 mg | ORAL_TABLET | ORAL | 0 refills | Status: DC | PRN

## 2019-02-11 NOTE — Telephone Encounter (Signed)
Patient requesting a refill on Oxycodone     LOV: 12/10/18  LRF: 12/15/18

## 2019-02-22 ENCOUNTER — Other Ambulatory Visit: Payer: Self-pay

## 2019-02-22 ENCOUNTER — Ambulatory Visit (INDEPENDENT_AMBULATORY_CARE_PROVIDER_SITE_OTHER): Payer: PPO | Admitting: Orthotics

## 2019-02-22 VITALS — Temp 98.1°F

## 2019-02-22 DIAGNOSIS — E1151 Type 2 diabetes mellitus with diabetic peripheral angiopathy without gangrene: Secondary | ICD-10-CM

## 2019-02-22 DIAGNOSIS — E1142 Type 2 diabetes mellitus with diabetic polyneuropathy: Secondary | ICD-10-CM | POA: Diagnosis not present

## 2019-02-22 DIAGNOSIS — E1342 Other specified diabetes mellitus with diabetic polyneuropathy: Secondary | ICD-10-CM | POA: Diagnosis not present

## 2019-02-22 DIAGNOSIS — M79675 Pain in left toe(s): Secondary | ICD-10-CM

## 2019-02-22 DIAGNOSIS — L84 Corns and callosities: Secondary | ICD-10-CM

## 2019-02-22 DIAGNOSIS — B351 Tinea unguium: Secondary | ICD-10-CM

## 2019-02-23 ENCOUNTER — Encounter: Payer: Self-pay | Admitting: Family Medicine

## 2019-02-23 ENCOUNTER — Ambulatory Visit (INDEPENDENT_AMBULATORY_CARE_PROVIDER_SITE_OTHER): Payer: PPO | Admitting: Family Medicine

## 2019-02-23 VITALS — BP 132/70 | HR 70 | Temp 98.5°F | Resp 16 | Ht 69.0 in | Wt 200.0 lb

## 2019-02-23 DIAGNOSIS — M7989 Other specified soft tissue disorders: Secondary | ICD-10-CM

## 2019-02-23 DIAGNOSIS — Z125 Encounter for screening for malignant neoplasm of prostate: Secondary | ICD-10-CM

## 2019-02-23 MED ORDER — ESCITALOPRAM OXALATE 10 MG PO TABS: 10.0000 mg | ORAL_TABLET | Freq: Every day | ORAL | 3 refills | Status: DC

## 2019-02-23 MED ORDER — FUROSEMIDE 40 MG PO TABS: 40.0000 mg | ORAL_TABLET | Freq: Every day | ORAL | 0 refills | Status: DC

## 2019-02-23 NOTE — Progress Notes (Signed)
Subjective:    Patient ID: Mark Hose., male    DOB: 07-Sep-1940, 78 y.o.   MRN: [id number]  HPI   Patient presents today with bilateral leg swelling for several days.  He has +1 pitting edema to the knees in both legs.  There is tense edema in his anterior shins.  He also reports increased shortness of breath.  He also reports some mild orthopnea.  He states that he is waking up every night around 3 AM having to go sleep in his recliner due to difficulty breathing and swelling.  His weight is up a few pounds from his last visit.  He denies any chest pain.  He denies any pleurisy.  He denies any fever or chills.  He has chronic right basilar crackles but he also has rails in the left lower lobe as well in addition to his edema. Past Medical History:  Diagnosis Date   Arthritis    lower back   Chronic back pain    Disc disease   Chronic kidney disease    Coronary atherosclerosis of native coronary artery    Coronary calcifications by chest CT, Myoview demonstrating inferolateral scar   Deafness in right ear    Diabetes mellitus    Diabetic retinopathy (HCC)    moderate nonproliferative with macular edema in right eye   Diastolic dysfunction 12/5972   Grade 1.   Essential hypertension, benign    Heart murmur    in past   HOH (hard of hearing)    Hyperkalemia    Kidney stones    Mixed hyperlipidemia    NAFLD (nonalcoholic fatty liver disease)    Pancreatic insufficiency    Diagnosed at [facility]   Prostate cancer [facility]) 2011   Shortness of breath dyspnea    occasional - liver pressing on right lung, decreased capacity   Subdural hematoma (HCC)    Type 2 diabetes mellitus ([place])    Wears hearing aid    "crossover" form right to left   Past Surgical History:  Procedure Laterality Date   APPENDECTOMY     Bilateral hip replacement     CATARACT EXTRACTION W/PHACO Left 05/15/2015   Procedure: CATARACT EXTRACTION PHACO AND INTRAOCULAR LENS PLACEMENT  ([place]);  Surgeon: Ronnell Freshwater, MD;  Location: [place];  Service: Ophthalmology;  Laterality: Left;  DIABETIC - insulin pump and oral meds   CATARACT EXTRACTION W/PHACO Right 08/28/2015   Procedure: CATARACT EXTRACTION PHACO AND INTRAOCULAR LENS PLACEMENT ([facility]);  Surgeon: Ronnell Freshwater, MD;  Location: [place];  Service: Ophthalmology;  Laterality: Right;  DIABETIC PER PT AND CINDY PLEASE KEEP ARRIVAL TIME AFTER 8AM    CHOLECYSTECTOMY     HERNIA REPAIR     PROSTATECTOMY  2011   Current Outpatient Medications on File Prior to Visit  Medication Sig Dispense Refill   acetaminophen (TYLENOL) 325 MG tablet Take 2 tablets (650 mg total) by mouth every 6 (six) hours as needed for mild pain (or Fever >/= 101).     albuterol (PROVENTIL HFA;VENTOLIN HFA) 108 (90 Base) MCG/ACT inhaler Inhale 2 puffs into the lungs every 4 (four) hours as needed for wheezing or shortness of breath. 1 Inhaler 0   amLODipine (NORVASC) 10 MG tablet Take 1 tablet (10 mg total) by mouth daily. 90 tablet 3   aspirin EC 81 MG tablet Take 81 mg by mouth at bedtime.      carvedilol (COREG) 25 MG tablet Take 1 tablet (25 mg total)  by mouth 2 (two) times daily with a meal. 90 tablet 3   diazepam (VALIUM) 5 MG tablet TAKE 1 TABLET BY MOUTH TWICE DAILY AS NEEDED FOR ANXIETY 60 tablet 1   doxazosin (CARDURA) 8 MG tablet TAKE 1 TABLET(8 MG) BY MOUTH DAILY 90 tablet 3   hydrochlorothiazide (HYDRODIURIL) 25 MG tablet Take 25 mg by mouth daily.     insulin NPH Human (HUMULIN N,NOVOLIN N) 100 UNIT/ML injection Inject 8-12 Units into the skin at bedtime. Take 12 units qam and 10 units qhs     insulin regular (NOVOLIN R,HUMULIN R) 100 units/mL injection Inject 8 Units into the skin 3 (three) times daily before meals. 12u qam - 8u qhs     metFORMIN (GLUCOPHAGE) 500 MG tablet Take 1,000 mg by mouth 2 (two) times daily with a meal.     Multiple Vitamin (MULTIVITAMIN WITH MINERALS) TABS  Take 1 tablet by mouth daily.     oxyCODONE (ROXICODONE) 5 MG immediate release tablet Take 1 tablet (5 mg total) by mouth every 4 (four) hours as needed for severe pain. 60 tablet 0   pravastatin (PRAVACHOL) 10 MG tablet TAKE 1 TABLET(10 MG) BY MOUTH DAILY WITH BREAKFAST 90 tablet 3   ZENPEP 10000-32000 units CPEP TAKE 1 CAPSULE BY MOUTH THREE TIMES DAILY BEFORE MEALS (Patient taking differently: Take 1 capsule by mouth 3 (three) times daily before meals. ) 180 capsule 3   No current facility-administered medications on file prior to visit.    Allergies  Allergen Reactions   Enalapril Maleate Cough   Social History   Socioeconomic History   Marital status: Married    Spouse name: Not on file   Number of children: Not on file   Years of education: Not on file   Highest education level: Not on file  Occupational History   Not on file  Social Needs   Financial resource strain: Not on file   Food insecurity    Worry: Not on file    Inability: Not on file   Transportation needs    Medical: Not on file    Non-medical: Not on file  Tobacco Use   Smoking status: Never Smoker   Smokeless tobacco: Never Used  Substance and Sexual Activity   Alcohol use: No   Drug use: No   Sexual activity: Not on file  Lifestyle   Physical activity    Days per week: Not on file    Minutes per session: Not on file   Stress: Not on file  Relationships   Social connections    Talks on phone: Not on file    Gets together: Not on file    Attends religious service: Not on file    Active member of club or organization: Not on file    Attends meetings of clubs or organizations: Not on file    Relationship status: Not on file   Intimate partner violence    Fear of current or ex partner: Not on file    Emotionally abused: Not on file    Physically abused: Not on file    Forced sexual activity: Not on file  Other Topics Concern   Not on file  Social History Narrative   Not  on file     Review of Systems  All other systems reviewed and are negative.      Objective:   Physical Exam Vitals signs reviewed.  Constitutional:      General: He is awake.  Appearance: He is well-developed.  HENT:     Head: Normocephalic and atraumatic.  Eyes:     General: No visual field deficit. Cardiovascular:     Rate and Rhythm: Normal rate and regular rhythm.  Pulmonary:     Effort: Pulmonary effort is normal. No respiratory distress.     Breath sounds: No stridor. Rales present. No decreased breath sounds, wheezing or rhonchi.  Musculoskeletal:     Right lower leg: Edema present.     Left lower leg: Edema present.  Neurological:     Mental Status: He is alert.     Cranial Nerves: No cranial nerve deficit, dysarthria or facial asymmetry.     Sensory: Sensation is intact.     Motor: Motor function is intact. No weakness, abnormal muscle tone or pronator drift.     Coordination: Romberg sign negative. Finger-Nose-Finger Test and Heel to [place] Test normal.     Gait: Gait normal.     Deep Tendon Reflexes: Reflexes are normal and symmetric.           Assessment & Plan:  The primary encounter diagnosis was Leg swelling. A diagnosis of Prostate cancer screening was also pertinent to this visit. Patient appears fluid overloaded today with the leg swelling.  Discontinue hydrochlorothiazide and replaced with Lasix 40 mg a day.  Check a BMP to monitor kidney function as well as potassium.  Recheck the patient next week or sooner if the swelling worsens.  At his appointment next week I will monitor his renal function, blood pressure since making the above medication changes and also his potassium.  While checking lab work today, patient also request a PSA for prostate cancer monitoring given his history of cancer.

## 2019-02-24 LAB — BASIC METABOLIC PANEL WITH GFR
BUN/Creatinine Ratio: 18 (calc) (ref 6–22)
BUN: 25 mg/dL (ref 7–25)
CO2: 23 mmol/L (ref 20–32)
Calcium: 9.2 mg/dL (ref 8.6–10.3)
Chloride: 108 mmol/L (ref 98–110)
Creat: 1.4 mg/dL — ABNORMAL HIGH (ref 0.70–1.18)
GFR, Est African American: 56 mL/min/{1.73_m2} — ABNORMAL LOW (ref >=60)
GFR, Est Non African American: 48 mL/min/{1.73_m2} — ABNORMAL LOW (ref >=60)
Glucose, Bld: 262 mg/dL — ABNORMAL HIGH (ref 65–99)
Potassium: 4.4 mmol/L (ref 3.5–5.3)
Sodium: 139 mmol/L (ref 135–146)

## 2019-02-24 LAB — PSA: PSA: <0.1 ng/mL (ref <=4.0)

## 2019-03-03 ENCOUNTER — Other Ambulatory Visit: Payer: Self-pay

## 2019-03-04 ENCOUNTER — Ambulatory Visit (INDEPENDENT_AMBULATORY_CARE_PROVIDER_SITE_OTHER): Payer: PPO | Admitting: Family Medicine

## 2019-03-04 ENCOUNTER — Encounter: Payer: Self-pay | Admitting: Family Medicine

## 2019-03-04 VITALS — BP 122/64 | HR 80 | Temp 98.7°F | Resp 18 | Ht 69.0 in | Wt 194.0 lb

## 2019-03-04 DIAGNOSIS — M7989 Other specified soft tissue disorders: Secondary | ICD-10-CM | POA: Diagnosis not present

## 2019-03-04 DIAGNOSIS — L03115 Cellulitis of right lower limb: Secondary | ICD-10-CM | POA: Diagnosis not present

## 2019-03-04 MED ORDER — CEPHALEXIN 500 MG PO CAPS: 500.0000 mg | ORAL_CAPSULE | Freq: Three times a day (TID) | ORAL | 0 refills | Status: DC

## 2019-03-04 NOTE — Progress Notes (Signed)
Subjective:    Patient ID: [name]., male    DOB: [birth year], 78 y.o.   MRN: [id number]  HPI  02/23/19 Patient presents today with bilateral leg swelling for several days.  He has +1 pitting edema to the knees in both legs.  There is tense edema in his anterior shins.  He also reports increased shortness of breath.  He also reports some mild orthopnea.  He states that he is waking up every night around 3 AM having to go sleep in his recliner due to difficulty breathing and swelling.  His weight is up a few pounds from his last visit.  He denies any chest pain.  He denies any pleurisy.  He denies any fever or chills.  He has chronic right basilar crackles but he also has rails in the left lower lobe as well in addition to his edema.  At that time, my plan was: Patient appears fluid overloaded today with the leg swelling.  Discontinue hydrochlorothiazide and replaced with Lasix 40 mg a day.  Check a BMP to monitor kidney function as well as potassium.  Recheck the patient next week or sooner if the swelling worsens.  At his appointment next week I will monitor his renal function, blood pressure since making the above medication changes and also his potassium.  While checking lab work today, patient also request a PSA for prostate cancer monitoring given his history of cancer.  03/04/19 Wt Readings from Last 3 Encounters:  03/04/19 194 lb (88 kg)  02/23/19 200 lb (90.7 kg)  12/10/18 198 lb (89.8 kg)   Patient has lost 6 pounds since his last office visit.  His breathing is also improved.  The swelling in his legs have improved.  I am concerned about possible dehydration given the 6 pound weight loss in 1 week however he appears euvolemic today and seems to be doing better on the Lasix than he was on the hydrochlorothiazide.  However the patient has developed an erythematous patch on his right shin just above the cut that he suffered:   Although not clear in the pitcher, the skin is  erythematous.  It is indurated.  It is warm to the touch and tender.  Patient has been placing Elocon cream on the rash twice a day for the last 3 days with no improvement.  There is not a similar rash on his left leg.  There was not similar erythema last week when I saw him when his legs were swollen.  Differential diagnosis includes early cellulitis versus venous stasis dermatitis due to swelling however given the asymmetry and the fact is not responding to diuresis and corticosteroid cream, I am concerned about early cellulitis in a diabetic patient Past Medical History:  Diagnosis Date  . Arthritis    lower back  . Chronic back pain    Disc disease  . Chronic kidney disease   . Coronary atherosclerosis of native coronary artery    Coronary calcifications by chest CT, Myoview demonstrating inferolateral scar  . Deafness in right ear   . Diabetes mellitus   . Diabetic retinopathy ([place])    moderate nonproliferative with macular edema in right eye  . Diastolic dysfunction 10/5595   Grade 1.  . Essential hypertension, benign   . Heart murmur    in past  . HOH (hard of hearing)   . Hyperkalemia   . Kidney stones   . Mixed hyperlipidemia   . NAFLD (nonalcoholic fatty liver disease)   .  Pancreatic insufficiency    Diagnosed at [facility]  . Prostate cancer ([place]) 2011  . Shortness of breath dyspnea    occasional - liver pressing on right lung, decreased capacity  . Subdural hematoma ([place])   . Type 2 diabetes mellitus ([place])   . Wears hearing aid    "crossover" form right to left   Past Surgical History:  Procedure Laterality Date  . APPENDECTOMY    . Bilateral hip replacement    . CATARACT EXTRACTION W/PHACO Left 05/15/2015   Procedure: CATARACT EXTRACTION PHACO AND INTRAOCULAR LENS PLACEMENT ([facility]);  Surgeon: Ronnell Freshwater, MD;  Location: [place];  Service: Ophthalmology;  Laterality: Left;  DIABETIC - insulin pump and oral meds  . CATARACT EXTRACTION W/PHACO Right  08/28/2015   Procedure: CATARACT EXTRACTION PHACO AND INTRAOCULAR LENS PLACEMENT ([facility]);  Surgeon: Ronnell Freshwater, MD;  Location: [place];  Service: Ophthalmology;  Laterality: Right;  DIABETIC PER PT AND CINDY PLEASE KEEP ARRIVAL TIME AFTER 8AM   . CHOLECYSTECTOMY    . HERNIA REPAIR    . PROSTATECTOMY  2011   Current Outpatient Medications on File Prior to Visit  Medication Sig Dispense Refill  . acetaminophen (TYLENOL) 325 MG tablet Take 2 tablets (650 mg total) by mouth every 6 (six) hours as needed for mild pain (or Fever >/= 101).    Marland Kitchen albuterol (PROVENTIL HFA;VENTOLIN HFA) 108 (90 Base) MCG/ACT inhaler Inhale 2 puffs into the lungs every 4 (four) hours as needed for wheezing or shortness of breath. 1 Inhaler 0  . amLODipine (NORVASC) 10 MG tablet Take 1 tablet (10 mg total) by mouth daily. 90 tablet 3  . aspirin EC 81 MG tablet Take 81 mg by mouth at bedtime.     . carvedilol (COREG) 25 MG tablet Take 1 tablet (25 mg total) by mouth 2 (two) times daily with a meal. 90 tablet 3  . diazepam (VALIUM) 5 MG tablet TAKE 1 TABLET BY MOUTH TWICE DAILY AS NEEDED FOR ANXIETY 60 tablet 1  . doxazosin (CARDURA) 8 MG tablet TAKE 1 TABLET(8 MG) BY MOUTH DAILY 90 tablet 3  . escitalopram (LEXAPRO) 10 MG tablet Take 1 tablet (10 mg total) by mouth daily. 30 tablet 3  . furosemide (LASIX) 40 MG tablet Take 1 tablet (40 mg total) by mouth daily. Stop hydrochlorothiazide 30 tablet 0  . hydrochlorothiazide (HYDRODIURIL) 25 MG tablet Take 25 mg by mouth daily.    . insulin NPH Human (HUMULIN N,NOVOLIN N) 100 UNIT/ML injection Inject 8-12 Units into the skin at bedtime. Take 12 units qam and 10 units qhs    . insulin regular (NOVOLIN R,HUMULIN R) 100 units/mL injection Inject 8 Units into the skin 3 (three) times daily before meals. 12u qam - 8u qhs    . metFORMIN (GLUCOPHAGE) 500 MG tablet Take 1,000 mg by mouth 2 (two) times daily with a meal.    . Multiple Vitamin (MULTIVITAMIN WITH  MINERALS) TABS Take 1 tablet by mouth daily.    Marland Kitchen oxyCODONE (ROXICODONE) 5 MG immediate release tablet Take 1 tablet (5 mg total) by mouth every 4 (four) hours as needed for severe pain. 60 tablet 0  . pravastatin (PRAVACHOL) 10 MG tablet TAKE 1 TABLET(10 MG) BY MOUTH DAILY WITH BREAKFAST 90 tablet 3  . ZENPEP 10000-32000 units CPEP TAKE 1 CAPSULE BY MOUTH THREE TIMES DAILY BEFORE MEALS (Patient taking differently: Take 1 capsule by mouth 3 (three) times daily before meals. ) 180 capsule 3   No  current facility-administered medications on file prior to visit.    Allergies  Allergen Reactions  . Enalapril Maleate Cough   Social History   Socioeconomic History  . Marital status: Married    Spouse name: Not on file  . Number of children: Not on file  . Years of education: Not on file  . Highest education level: Not on file  Occupational History  . Not on file  Social Needs  . Financial resource strain: Not on file  . Food insecurity    Worry: Not on file    Inability: Not on file  . Transportation needs    Medical: Not on file    Non-medical: Not on file  Tobacco Use  . Smoking status: Never Smoker  . Smokeless tobacco: Never Used  Substance and Sexual Activity  . Alcohol use: No  . Drug use: No  . Sexual activity: Not on file  Lifestyle  . Physical activity    Days per week: Not on file    Minutes per session: Not on file  . Stress: Not on file  Relationships  . Social Herbalist on phone: Not on file    Gets together: Not on file    Attends religious service: Not on file    Active member of club or organization: Not on file    Attends meetings of clubs or organizations: Not on file    Relationship status: Not on file  . Intimate partner violence    Fear of current or ex partner: Not on file    Emotionally abused: Not on file    Physically abused: Not on file    Forced sexual activity: Not on file  Other Topics Concern  . Not on file  Social History  Narrative  . Not on file     Review of Systems  All other systems reviewed and are negative.      Objective:   Physical Exam Vitals signs reviewed.  Constitutional:      General: He is awake.     Appearance: He is well-developed.  HENT:     Head: Normocephalic and atraumatic.  Cardiovascular:     Rate and Rhythm: Normal rate and regular rhythm.  Pulmonary:     Effort: Pulmonary effort is normal. No respiratory distress.     Breath sounds: No stridor. No decreased breath sounds, wheezing, rhonchi or rales.  Musculoskeletal:     Right lower leg: No edema.     Left lower leg: No edema.  Skin:    General: Skin is warm.     Findings: Erythema and lesion present.  Neurological:     Mental Status: He is alert.     Sensory: Sensation is intact.     Motor: Motor function is intact.           Assessment & Plan:  1. Leg swelling Patient appears euvolemic.  I will continue the patient on Lasix 40 mg daily unless there is signs of prerenal azotemia and renal insufficiency on his BMP.  Monitor his potassium as well - BASIC METABOLIC PANEL WITH GFR  2. Cellulitis of leg, right Given the pain, erythema, warmth, and the lack of response to corticosteroid creams, I am concerned that the patient may be having early cellulitis on his left leg.  Begin Keflex 500 mg p.o. 3 times daily for 7 days

## 2019-03-05 ENCOUNTER — Other Ambulatory Visit: Payer: Self-pay | Admitting: Family Medicine

## 2019-03-05 DIAGNOSIS — N289 Disorder of kidney and ureter, unspecified: Secondary | ICD-10-CM

## 2019-03-05 LAB — BASIC METABOLIC PANEL WITH GFR
BUN/Creatinine Ratio: 19 (calc) (ref 6–22)
BUN: 34 mg/dL — ABNORMAL HIGH (ref 7–25)
CO2: 19 mmol/L — ABNORMAL LOW (ref 20–32)
Calcium: 8.7 mg/dL (ref 8.6–10.3)
Chloride: 114 mmol/L — ABNORMAL HIGH (ref 98–110)
Creat: 1.75 mg/dL — ABNORMAL HIGH (ref 0.70–1.18)
GFR, Est African American: 43 mL/min/{1.73_m2} — ABNORMAL LOW (ref >=60)
GFR, Est Non African American: 37 mL/min/{1.73_m2} — ABNORMAL LOW (ref >=60)
Glucose, Bld: 241 mg/dL — ABNORMAL HIGH (ref 65–99)
Potassium: 4.4 mmol/L (ref 3.5–5.3)
Sodium: 140 mmol/L (ref 135–146)

## 2019-03-11 ENCOUNTER — Other Ambulatory Visit: Payer: PPO

## 2019-03-11 ENCOUNTER — Other Ambulatory Visit: Payer: Self-pay

## 2019-03-11 DIAGNOSIS — N289 Disorder of kidney and ureter, unspecified: Secondary | ICD-10-CM | POA: Diagnosis not present

## 2019-03-12 LAB — BASIC METABOLIC PANEL
BUN/Creatinine Ratio: 19 (calc) (ref 6–22)
BUN: 28 mg/dL — ABNORMAL HIGH (ref 7–25)
CO2: 21 mmol/L (ref 20–32)
Calcium: 8.3 mg/dL — ABNORMAL LOW (ref 8.6–10.3)
Chloride: 107 mmol/L (ref 98–110)
Creat: 1.47 mg/dL — ABNORMAL HIGH (ref 0.70–1.18)
Glucose, Bld: 420 mg/dL — ABNORMAL HIGH (ref 65–99)
Potassium: 4.5 mmol/L (ref 3.5–5.3)
Sodium: 139 mmol/L (ref 135–146)

## 2019-03-16 DIAGNOSIS — E113313 Type 2 diabetes mellitus with moderate nonproliferative diabetic retinopathy with macular edema, bilateral: Secondary | ICD-10-CM | POA: Diagnosis not present

## 2019-03-19 DIAGNOSIS — Z794 Long term (current) use of insulin: Secondary | ICD-10-CM | POA: Diagnosis not present

## 2019-03-19 DIAGNOSIS — E1122 Type 2 diabetes mellitus with diabetic chronic kidney disease: Secondary | ICD-10-CM | POA: Diagnosis not present

## 2019-03-19 DIAGNOSIS — E113293 Type 2 diabetes mellitus with mild nonproliferative diabetic retinopathy without macular edema, bilateral: Secondary | ICD-10-CM | POA: Diagnosis not present

## 2019-03-19 DIAGNOSIS — N183 Chronic kidney disease, stage 3 (moderate): Secondary | ICD-10-CM | POA: Diagnosis not present

## 2019-03-19 DIAGNOSIS — E11649 Type 2 diabetes mellitus with hypoglycemia without coma: Secondary | ICD-10-CM | POA: Diagnosis not present

## 2019-03-19 DIAGNOSIS — E1159 Type 2 diabetes mellitus with other circulatory complications: Secondary | ICD-10-CM | POA: Diagnosis not present

## 2019-03-19 DIAGNOSIS — E1142 Type 2 diabetes mellitus with diabetic polyneuropathy: Secondary | ICD-10-CM | POA: Diagnosis not present

## 2019-03-20 ENCOUNTER — Other Ambulatory Visit: Payer: Self-pay | Admitting: Family Medicine

## 2019-03-20 ENCOUNTER — Encounter: Payer: Self-pay | Admitting: Family Medicine

## 2019-03-22 MED ORDER — FUROSEMIDE 20 MG PO TABS: 20.0000 mg | ORAL_TABLET | Freq: Every day | ORAL | 3 refills | Status: DC

## 2019-03-29 ENCOUNTER — Encounter: Payer: Self-pay | Admitting: Family Medicine

## 2019-03-29 DIAGNOSIS — K7581 Nonalcoholic steatohepatitis (NASH): Secondary | ICD-10-CM

## 2019-03-29 MED ORDER — OXYCODONE HCL 5 MG PO TABS: 5.0000 mg | ORAL_TABLET | ORAL | 0 refills | Status: DC | PRN

## 2019-03-29 NOTE — Telephone Encounter (Signed)
Ok to refill??  Last office visit 02/23/2019.  Last refill 02/11/2019.

## 2019-04-01 ENCOUNTER — Encounter: Payer: Self-pay | Admitting: Family Medicine

## 2019-04-01 ENCOUNTER — Other Ambulatory Visit: Payer: Self-pay

## 2019-04-01 ENCOUNTER — Ambulatory Visit (INDEPENDENT_AMBULATORY_CARE_PROVIDER_SITE_OTHER): Payer: PPO | Admitting: Family Medicine

## 2019-04-01 VITALS — BP 110/60 | HR 70 | Temp 98.5°F | Resp 14 | Ht 69.0 in | Wt 205.0 lb

## 2019-04-01 DIAGNOSIS — Z23 Encounter for immunization: Secondary | ICD-10-CM

## 2019-04-01 DIAGNOSIS — M7989 Other specified soft tissue disorders: Secondary | ICD-10-CM | POA: Diagnosis not present

## 2019-04-01 NOTE — Progress Notes (Signed)
Subjective:    Patient ID: Mark Hose., male    DOB: 1941/04/15, 78 y.o.   MRN: [id number]  HPI  02/23/19 Patient presents today with bilateral leg swelling for several days.  He has +1 pitting edema to the knees in both legs.  There is tense edema in his anterior shins.  He also reports increased shortness of breath.  He also reports some mild orthopnea.  He states that he is waking up every night around 3 AM having to go sleep in his recliner due to difficulty breathing and swelling.  His weight is up a few pounds from his last visit.  He denies any chest pain.  He denies any pleurisy.  He denies any fever or chills.  He has chronic right basilar crackles but he also has rails in the left lower lobe as well in addition to his edema.  At that time, my plan was: Patient appears fluid overloaded today with the leg swelling.  Discontinue hydrochlorothiazide and replaced with Lasix 40 mg a day.  Check a BMP to monitor kidney function as well as potassium.  Recheck the patient next week or sooner if the swelling worsens.  At his appointment next week I will monitor his renal function, blood pressure since making the above medication changes and also his potassium.  While checking lab work today, patient also request a PSA for prostate cancer monitoring given his history of cancer.  03/04/19 Wt Readings from Last 3 Encounters:  04/01/19 205 lb (93 kg)  03/04/19 194 lb (88 kg)  02/23/19 200 lb (90.7 kg)   Patient has lost 6 pounds since his last office visit.  His breathing is also improved.  The swelling in his legs have improved.  I am concerned about possible dehydration given the 6 pound weight loss in 1 week however he appears euvolemic today and seems to be doing better on the Lasix than he was on the hydrochlorothiazide.  However the patient has developed an erythematous patch on his right shin just above the cut that he suffered:   Although not clear in the picture, the skin is  erythematous.  It is indurated.  It is warm to the touch and tender.  Patient has been placing Elocon cream on the rash twice a day for the last 3 days with no improvement.  There is not a similar rash on his left leg.  There was not similar erythema last week when I saw him when his legs were swollen.  Differential diagnosis includes early cellulitis versus venous stasis dermatitis due to swelling however given the asymmetry and the fact is not responding to diuresis and corticosteroid cream, I am concerned about early cellulitis in a diabetic patient.  At that time, my plan was: Patient appears euvolemic.  I will continue the patient on Lasix 40 mg daily unless there is signs of prerenal azotemia and renal insufficiency on his BMP.  Monitor his potassium as well.  Patient was given keflex 500 tid for 7 days.  Wt Readings from Last 3 Encounters:  04/01/19 205 lb (93 kg)  03/04/19 194 lb (88 kg)  02/23/19 200 lb (90.7 kg)   April 01, 2019  Patient is here today for follow-up.  Since I last saw him, he has gained 11 pounds.  He has +1 pitting edema in both legs up to his knees.  He also has bibasilar crackles.  He denies any chest pain or shortness of breath but he does appear  to be fluid overloaded.  He is only taking Lasix 20 mg a day.  After his last visit I checked his renal function and his creatinine had risen to 1.75.  Therefore I instructed the patient to take just a half a Lasix every day.  This clearly has not worked as the patient appears fluid overloaded.  He denies eating a high salt diet.  He denies any chest pain. Past Medical History:  Diagnosis Date  . Arthritis    lower back  . Chronic back pain    Disc disease  . Chronic kidney disease   . Coronary atherosclerosis of native coronary artery    Coronary calcifications by chest CT, Myoview demonstrating inferolateral scar  . Deafness in right ear   . Diabetes mellitus   . Diabetic retinopathy ([place])    moderate nonproliferative  with macular edema in right eye  . Diastolic dysfunction 08/4968   Grade 1.  . Essential hypertension, benign   . Heart murmur    in past  . HOH (hard of hearing)   . Hyperkalemia   . Kidney stones   . Mixed hyperlipidemia   . NAFLD (nonalcoholic fatty liver disease)   . Pancreatic insufficiency    Diagnosed at [facility]  . Prostate cancer ([place]) 2011  . Shortness of breath dyspnea    occasional - liver pressing on right lung, decreased capacity  . Subdural hematoma ([place])   . Type 2 diabetes mellitus ([place])   . Wears hearing aid    "crossover" form right to left   Past Surgical History:  Procedure Laterality Date  . APPENDECTOMY    . Bilateral hip replacement    . CATARACT EXTRACTION W/PHACO Left 05/15/2015   Procedure: CATARACT EXTRACTION PHACO AND INTRAOCULAR LENS PLACEMENT (IOC);  Surgeon: Ronnell Freshwater, MD;  Location: [place];  Service: Ophthalmology;  Laterality: Left;  DIABETIC - insulin pump and oral meds  . CATARACT EXTRACTION W/PHACO Right 08/28/2015   Procedure: CATARACT EXTRACTION PHACO AND INTRAOCULAR LENS PLACEMENT (IOC);  Surgeon: Ronnell Freshwater, MD;  Location: [place];  Service: Ophthalmology;  Laterality: Right;  DIABETIC PER PT AND CINDY PLEASE KEEP ARRIVAL TIME AFTER 8AM   . CHOLECYSTECTOMY    . HERNIA REPAIR    . PROSTATECTOMY  2011   Current Outpatient Medications on File Prior to Visit  Medication Sig Dispense Refill  . acetaminophen (TYLENOL) 325 MG tablet Take 2 tablets (650 mg total) by mouth every 6 (six) hours as needed for mild pain (or Fever >/= 101).    Marland Kitchen albuterol (PROVENTIL HFA;VENTOLIN HFA) 108 (90 Base) MCG/ACT inhaler Inhale 2 puffs into the lungs every 4 (four) hours as needed for wheezing or shortness of breath. 1 Inhaler 0  . amLODipine (NORVASC) 10 MG tablet Take 1 tablet (10 mg total) by mouth daily. 90 tablet 3  . aspirin EC 81 MG tablet Take 81 mg by mouth at bedtime.     . carvedilol (COREG) 25 MG  tablet Take 1 tablet (25 mg total) by mouth 2 (two) times daily with a meal. 90 tablet 3  . diazepam (VALIUM) 5 MG tablet TAKE 1 TABLET BY MOUTH TWICE DAILY AS NEEDED FOR ANXIETY 60 tablet 1  . doxazosin (CARDURA) 8 MG tablet TAKE 1 TABLET(8 MG) BY MOUTH DAILY 90 tablet 3  . escitalopram (LEXAPRO) 10 MG tablet Take 1 tablet (10 mg total) by mouth daily. 30 tablet 3  . furosemide (LASIX) 20 MG tablet Take 1 tablet (20  mg total) by mouth daily. 90 tablet 3  . hydrochlorothiazide (HYDRODIURIL) 25 MG tablet Take 25 mg by mouth daily.    . insulin NPH Human (HUMULIN N,NOVOLIN N) 100 UNIT/ML injection Inject 8-12 Units into the skin at bedtime. Take 12 units qam and 10 units qhs    . insulin regular (NOVOLIN R,HUMULIN R) 100 units/mL injection Inject 8 Units into the skin 3 (three) times daily before meals. 12u qam - 8u qhs    . metFORMIN (GLUCOPHAGE) 500 MG tablet Take 1,000 mg by mouth 2 (two) times daily with a meal.    . Multiple Vitamin (MULTIVITAMIN WITH MINERALS) TABS Take 1 tablet by mouth daily.    Marland Kitchen oxyCODONE (ROXICODONE) 5 MG immediate release tablet Take 1 tablet (5 mg total) by mouth every 4 (four) hours as needed for severe pain. 60 tablet 0  . pravastatin (PRAVACHOL) 10 MG tablet TAKE 1 TABLET(10 MG) BY MOUTH DAILY WITH BREAKFAST 90 tablet 3  . ZENPEP 10000-32000 units CPEP TAKE 1 CAPSULE BY MOUTH THREE TIMES DAILY BEFORE MEALS (Patient taking differently: Take 1 capsule by mouth 3 (three) times daily before meals. ) 180 capsule 3   No current facility-administered medications on file prior to visit.    Allergies  Allergen Reactions  . Enalapril Maleate Cough   Social History   Socioeconomic History  . Marital status: Married    Spouse name: Not on file  . Number of children: Not on file  . Years of education: Not on file  . Highest education level: Not on file  Occupational History  . Not on file  Social Needs  . Financial resource strain: Not on file  . Food insecurity     Worry: Not on file    Inability: Not on file  . Transportation needs    Medical: Not on file    Non-medical: Not on file  Tobacco Use  . Smoking status: Never Smoker  . Smokeless tobacco: Never Used  Substance and Sexual Activity  . Alcohol use: No  . Drug use: No  . Sexual activity: Not on file  Lifestyle  . Physical activity    Days per week: Not on file    Minutes per session: Not on file  . Stress: Not on file  Relationships  . Social Herbalist on phone: Not on file    Gets together: Not on file    Attends religious service: Not on file    Active member of club or organization: Not on file    Attends meetings of clubs or organizations: Not on file    Relationship status: Not on file  . Intimate partner violence    Fear of current or ex partner: Not on file    Emotionally abused: Not on file    Physically abused: Not on file    Forced sexual activity: Not on file  Other Topics Concern  . Not on file  Social History Narrative  . Not on file     Review of Systems  All other systems reviewed and are negative.      Objective:   Physical Exam Vitals signs reviewed.  Constitutional:      General: He is awake.     Appearance: He is well-developed.  HENT:     Head: Normocephalic and atraumatic.  Cardiovascular:     Rate and Rhythm: Normal rate and regular rhythm.  Pulmonary:     Effort: Pulmonary effort is normal. No respiratory distress.  Breath sounds: No stridor. Rales present. No decreased breath sounds, wheezing or rhonchi.  Musculoskeletal:     Right lower leg: Edema present.     Left lower leg: Edema present.  Skin:    General: Skin is warm.     Findings: Erythema and lesion present.  Neurological:     Mental Status: He is alert.     Sensory: Sensation is intact.     Motor: Motor function is intact.           Assessment & Plan:  The primary encounter diagnosis was Leg swelling. A diagnosis of Need for immunization against  influenza was also pertinent to this visit. I will increase his Lasix to 40 mg a day.  I will obtain a baseline renal panel today and then see the patient back in 1 week.  If his swelling is better next week I will repeat his renal function test.  If it is stable we will continue Lasix 40 mg a day.  If he is developed prerenal azotemia, then we will have to split the difference, and alternate 40 mg a day with 20 mg a day.

## 2019-04-02 LAB — BASIC METABOLIC PANEL WITH GFR
BUN/Creatinine Ratio: 17 (calc) (ref 6–22)
BUN: 30 mg/dL — ABNORMAL HIGH (ref 7–25)
CO2: 21 mmol/L (ref 20–32)
Calcium: 8.4 mg/dL — ABNORMAL LOW (ref 8.6–10.3)
Chloride: 112 mmol/L — ABNORMAL HIGH (ref 98–110)
Creat: 1.73 mg/dL — ABNORMAL HIGH (ref 0.70–1.18)
GFR, Est African American: 43 mL/min/{1.73_m2} — ABNORMAL LOW (ref >=60)
GFR, Est Non African American: 37 mL/min/{1.73_m2} — ABNORMAL LOW (ref >=60)
Glucose, Bld: 235 mg/dL — ABNORMAL HIGH (ref 65–99)
Potassium: 4.4 mmol/L (ref 3.5–5.3)
Sodium: 142 mmol/L (ref 135–146)

## 2019-04-02 LAB — BRAIN NATRIURETIC PEPTIDE: Brain Natriuretic Peptide: 202 pg/mL — ABNORMAL HIGH (ref <100)

## 2019-04-08 ENCOUNTER — Ambulatory Visit: Payer: PPO | Admitting: Family Medicine

## 2019-04-09 ENCOUNTER — Encounter: Payer: Self-pay | Admitting: Family Medicine

## 2019-04-09 ENCOUNTER — Other Ambulatory Visit: Payer: Self-pay

## 2019-04-09 ENCOUNTER — Ambulatory Visit (INDEPENDENT_AMBULATORY_CARE_PROVIDER_SITE_OTHER): Payer: PPO | Admitting: Family Medicine

## 2019-04-09 VITALS — BP 120/62 | HR 72 | Temp 98.2°F | Resp 16 | Ht 69.0 in | Wt 202.0 lb

## 2019-04-09 DIAGNOSIS — M7989 Other specified soft tissue disorders: Secondary | ICD-10-CM

## 2019-04-09 NOTE — Progress Notes (Signed)
Subjective:    Patient ID: Mark Hose., male    DOB: 1941-06-14, 78 y.o.   MRN: [id number]  HPI  02/23/19 Patient presents today with bilateral leg swelling for several days.  He has +1 pitting edema to the knees in both legs.  There is tense edema in his anterior shins.  He also reports increased shortness of breath.  He also reports some mild orthopnea.  He states that he is waking up every night around 3 AM having to go sleep in his recliner due to difficulty breathing and swelling.  His weight is up a few pounds from his last visit.  He denies any chest pain.  He denies any pleurisy.  He denies any fever or chills.  He has chronic right basilar crackles but he also has rails in the left lower lobe as well in addition to his edema.  At that time, my plan was: Patient appears fluid overloaded today with the leg swelling.  Discontinue hydrochlorothiazide and replaced with Lasix 40 mg a day.  Check a BMP to monitor kidney function as well as potassium.  Recheck the patient next week or sooner if the swelling worsens.  At his appointment next week I will monitor his renal function, blood pressure since making the above medication changes and also his potassium.  While checking lab work today, patient also request a PSA for prostate cancer monitoring given his history of cancer.  03/04/19 Wt Readings from Last 3 Encounters:  04/09/19 202 lb (91.6 kg)  04/01/19 205 lb (93 kg)  03/04/19 194 lb (88 kg)   Patient has lost 6 pounds since his last office visit.  His breathing is also improved.  The swelling in his legs have improved.  I am concerned about possible dehydration given the 6 pound weight loss in 1 week however he appears euvolemic today and seems to be doing better on the Lasix than he was on the hydrochlorothiazide.  However the patient has developed an erythematous patch on his right shin just above the cut that he suffered:   Although not clear in the picture, the skin is  erythematous.  It is indurated.  It is warm to the touch and tender.  Patient has been placing Elocon cream on the rash twice a day for the last 3 days with no improvement.  There is not a similar rash on his left leg.  There was not similar erythema last week when I saw him when his legs were swollen.  Differential diagnosis includes early cellulitis versus venous stasis dermatitis due to swelling however given the asymmetry and the fact is not responding to diuresis and corticosteroid cream, I am concerned about early cellulitis in a diabetic patient.  At that time, my plan was: Patient appears euvolemic.  I will continue the patient on Lasix 40 mg daily unless there is signs of prerenal azotemia and renal insufficiency on his BMP.  Monitor his potassium as well.  Patient was given keflex 500 tid for 7 days.  Wt Readings from Last 3 Encounters:  04/09/19 202 lb (91.6 kg)  04/01/19 205 lb (93 kg)  03/04/19 194 lb (88 kg)   April 01, 2019  Patient is here today for follow-up.  Since I last saw him, he has gained 11 pounds.  He has +1 pitting edema in both legs up to his knees.  He also has bibasilar crackles.  He denies any chest pain or shortness of breath but he does appear  to be fluid overloaded.  He is only taking Lasix 20 mg a day.  After his last visit I checked his renal function and his creatinine had risen to 1.75.  Therefore I instructed the patient to take just a half a Lasix every day.  This clearly has not worked as the patient appears fluid overloaded.  He denies eating a high salt diet.  He denies any chest pain.  At that time, my plan was: I will increase his Lasix to 40 mg a day.  I will obtain a baseline renal panel today and then see the patient back in 1 week.  If his swelling is better next week I will repeat his renal function test.  If it is stable we will continue Lasix 40 mg a day.  If he is developed prerenal azotemia, then we will have to split the difference, and alternate 40  mg a day with 20 mg a day.  04/09/19 At his last visit, prior to increasing Lasix to 40 mg a day, his creatinine was already 1.73.  However the patient was fluid overloaded and I made the decision that he needed to stay at the higher dose of diuretic.  He is here today for follow-up. Wt Readings from Last 3 Encounters:  04/09/19 202 lb (91.6 kg)  04/01/19 205 lb (93 kg)  03/04/19 194 lb (88 kg)   Patient took 40 mg of Lasix every day for the last week except for the last 2 days.  For some reason he switch back to 20 mg a day.  He has lost 3 pounds since his last visit although he still has significant pitting edema in both legs.  He denies any shortness of breath Past Medical History:  Diagnosis Date  . Arthritis    lower back  . Chronic back pain    Disc disease  . Chronic kidney disease   . Coronary atherosclerosis of native coronary artery    Coronary calcifications by chest CT, Myoview demonstrating inferolateral scar  . Deafness in right ear   . Diabetes mellitus   . Diabetic retinopathy ([place])    moderate nonproliferative with macular edema in right eye  . Diastolic dysfunction 01/8114   Grade 1.  . Essential hypertension, benign   . Heart murmur    in past  . HOH (hard of hearing)   . Hyperkalemia   . Kidney stones   . Mixed hyperlipidemia   . NAFLD (nonalcoholic fatty liver disease)   . Pancreatic insufficiency    Diagnosed at [facility]  . Prostate cancer ([place]) 2011  . Shortness of breath dyspnea    occasional - liver pressing on right lung, decreased capacity  . Subdural hematoma ([place])   . Type 2 diabetes mellitus ([place])   . Wears hearing aid    "crossover" form right to left   Past Surgical History:  Procedure Laterality Date  . APPENDECTOMY    . Bilateral hip replacement    . CATARACT EXTRACTION W/PHACO Left 05/15/2015   Procedure: CATARACT EXTRACTION PHACO AND INTRAOCULAR LENS PLACEMENT (IOC);  Surgeon: Ronnell Freshwater, MD;  Location: [place];   Service: Ophthalmology;  Laterality: Left;  DIABETIC - insulin pump and oral meds  . CATARACT EXTRACTION W/PHACO Right 08/28/2015   Procedure: CATARACT EXTRACTION PHACO AND INTRAOCULAR LENS PLACEMENT (IOC);  Surgeon: Ronnell Freshwater, MD;  Location: [place];  Service: Ophthalmology;  Laterality: Right;  DIABETIC PER PT AND CINDY PLEASE KEEP ARRIVAL TIME AFTER 8AM   .  CHOLECYSTECTOMY    . HERNIA REPAIR    . PROSTATECTOMY  2011   Current Outpatient Medications on File Prior to Visit  Medication Sig Dispense Refill  . acetaminophen (TYLENOL) 325 MG tablet Take 2 tablets (650 mg total) by mouth every 6 (six) hours as needed for mild pain (or Fever >/= 101).    Marland Kitchen albuterol (PROVENTIL HFA;VENTOLIN HFA) 108 (90 Base) MCG/ACT inhaler Inhale 2 puffs into the lungs every 4 (four) hours as needed for wheezing or shortness of breath. 1 Inhaler 0  . amLODipine (NORVASC) 10 MG tablet Take 1 tablet (10 mg total) by mouth daily. 90 tablet 3  . aspirin EC 81 MG tablet Take 81 mg by mouth at bedtime.     . carvedilol (COREG) 25 MG tablet Take 1 tablet (25 mg total) by mouth 2 (two) times daily with a meal. 90 tablet 3  . diazepam (VALIUM) 5 MG tablet TAKE 1 TABLET BY MOUTH TWICE DAILY AS NEEDED FOR ANXIETY 60 tablet 1  . doxazosin (CARDURA) 8 MG tablet TAKE 1 TABLET(8 MG) BY MOUTH DAILY 90 tablet 3  . escitalopram (LEXAPRO) 10 MG tablet Take 1 tablet (10 mg total) by mouth daily. 30 tablet 3  . furosemide (LASIX) 20 MG tablet Take 1 tablet (20 mg total) by mouth daily. 90 tablet 3  . hydrochlorothiazide (HYDRODIURIL) 25 MG tablet Take 25 mg by mouth daily.    . insulin NPH Human (HUMULIN N,NOVOLIN N) 100 UNIT/ML injection Inject 8-12 Units into the skin at bedtime. Take 12 units qam and 10 units qhs    . insulin regular (NOVOLIN R,HUMULIN R) 100 units/mL injection Inject 8 Units into the skin 3 (three) times daily before meals. 12u qam - 8u qhs    . metFORMIN (GLUCOPHAGE) 500 MG tablet Take  1,000 mg by mouth 2 (two) times daily with a meal.    . Multiple Vitamin (MULTIVITAMIN WITH MINERALS) TABS Take 1 tablet by mouth daily.    Marland Kitchen oxyCODONE (ROXICODONE) 5 MG immediate release tablet Take 1 tablet (5 mg total) by mouth every 4 (four) hours as needed for severe pain. 60 tablet 0  . pravastatin (PRAVACHOL) 10 MG tablet TAKE 1 TABLET(10 MG) BY MOUTH DAILY WITH BREAKFAST 90 tablet 3  . ZENPEP 10000-32000 units CPEP TAKE 1 CAPSULE BY MOUTH THREE TIMES DAILY BEFORE MEALS (Patient taking differently: Take 1 capsule by mouth 3 (three) times daily before meals. ) 180 capsule 3   No current facility-administered medications on file prior to visit.    Allergies  Allergen Reactions  . Enalapril Maleate Cough   Social History   Socioeconomic History  . Marital status: Married    Spouse name: Not on file  . Number of children: Not on file  . Years of education: Not on file  . Highest education level: Not on file  Occupational History  . Not on file  Social Needs  . Financial resource strain: Not on file  . Food insecurity    Worry: Not on file    Inability: Not on file  . Transportation needs    Medical: Not on file    Non-medical: Not on file  Tobacco Use  . Smoking status: Never Smoker  . Smokeless tobacco: Never Used  Substance and Sexual Activity  . Alcohol use: No  . Drug use: No  . Sexual activity: Not on file  Lifestyle  . Physical activity    Days per week: Not on file    Minutes  per session: Not on file  . Stress: Not on file  Relationships  . Social Herbalist on phone: Not on file    Gets together: Not on file    Attends religious service: Not on file    Active member of club or organization: Not on file    Attends meetings of clubs or organizations: Not on file    Relationship status: Not on file  . Intimate partner violence    Fear of current or ex partner: Not on file    Emotionally abused: Not on file    Physically abused: Not on file     Forced sexual activity: Not on file  Other Topics Concern  . Not on file  Social History Narrative  . Not on file     Review of Systems  All other systems reviewed and are negative.      Objective:   Physical Exam Vitals signs reviewed.  Constitutional:      General: He is awake.     Appearance: He is well-developed.  HENT:     Head: Normocephalic and atraumatic.  Cardiovascular:     Rate and Rhythm: Normal rate and regular rhythm.  Pulmonary:     Effort: Pulmonary effort is normal. No respiratory distress.     Breath sounds: No stridor. Rales present. No decreased breath sounds, wheezing or rhonchi.  Musculoskeletal:     Right lower leg: Edema present.     Left lower leg: Edema present.  Skin:    General: Skin is warm.     Findings: Erythema and lesion present.  Neurological:     Mental Status: He is alert.     Sensory: Sensation is intact.     Motor: Motor function is intact.           Assessment & Plan:  Leg swelling - Plan: BASIC METABOLIC PANEL WITH GFR  Given the leg swelling, and the worsening in his renal function, I am hesitant to increase his diuretic any further.  I will recheck a BMP today to monitor his renal function.  If stable we will continue Lasix 40 mg a day.  Meanwhile I feel that the patient needs to discontinue amlodipine as this is contributing to the leg swelling.  Reassess blood pressure and leg swelling in 1 week.

## 2019-04-10 LAB — BASIC METABOLIC PANEL WITH GFR
BUN/Creatinine Ratio: 16 (calc) (ref 6–22)
BUN: 29 mg/dL — ABNORMAL HIGH (ref 7–25)
CO2: 23 mmol/L (ref 20–32)
Calcium: 8.7 mg/dL (ref 8.6–10.3)
Chloride: 109 mmol/L (ref 98–110)
Creat: 1.82 mg/dL — ABNORMAL HIGH (ref 0.70–1.18)
GFR, Est African American: 40 mL/min/{1.73_m2} — ABNORMAL LOW (ref >=60)
GFR, Est Non African American: 35 mL/min/{1.73_m2} — ABNORMAL LOW (ref >=60)
Glucose, Bld: 203 mg/dL — ABNORMAL HIGH (ref 65–99)
Potassium: 4.3 mmol/L (ref 3.5–5.3)
Sodium: 141 mmol/L (ref 135–146)

## 2019-04-16 ENCOUNTER — Other Ambulatory Visit: Payer: Self-pay | Admitting: Family Medicine

## 2019-04-16 NOTE — Telephone Encounter (Signed)
Ok to refill??  Last office visit 04/09/2019.  Last refill 01/05/2019.

## 2019-04-19 ENCOUNTER — Other Ambulatory Visit: Payer: Self-pay | Admitting: Family Medicine

## 2019-04-20 ENCOUNTER — Other Ambulatory Visit: Payer: Self-pay

## 2019-04-20 ENCOUNTER — Other Ambulatory Visit: Payer: PPO

## 2019-04-20 ENCOUNTER — Other Ambulatory Visit: Payer: Self-pay | Admitting: Family Medicine

## 2019-04-20 DIAGNOSIS — N289 Disorder of kidney and ureter, unspecified: Secondary | ICD-10-CM

## 2019-04-21 LAB — BASIC METABOLIC PANEL
BUN/Creatinine Ratio: 19 (calc) (ref 6–22)
BUN: 36 mg/dL — ABNORMAL HIGH (ref 7–25)
CO2: 19 mmol/L — ABNORMAL LOW (ref 20–32)
Calcium: 8.3 mg/dL — ABNORMAL LOW (ref 8.6–10.3)
Chloride: 112 mmol/L — ABNORMAL HIGH (ref 98–110)
Creat: 1.85 mg/dL — ABNORMAL HIGH (ref 0.70–1.18)
Glucose, Bld: 246 mg/dL — ABNORMAL HIGH (ref 65–99)
Potassium: 4.3 mmol/L (ref 3.5–5.3)
Sodium: 141 mmol/L (ref 135–146)

## 2019-04-23 ENCOUNTER — Other Ambulatory Visit: Payer: Self-pay | Admitting: Family Medicine

## 2019-04-23 ENCOUNTER — Ambulatory Visit: Payer: PPO | Admitting: Podiatry

## 2019-04-23 ENCOUNTER — Encounter: Payer: Self-pay | Admitting: Podiatry

## 2019-04-23 ENCOUNTER — Other Ambulatory Visit: Payer: Self-pay

## 2019-04-23 DIAGNOSIS — L84 Corns and callosities: Secondary | ICD-10-CM | POA: Diagnosis not present

## 2019-04-23 DIAGNOSIS — M79674 Pain in right toe(s): Secondary | ICD-10-CM

## 2019-04-23 DIAGNOSIS — E1142 Type 2 diabetes mellitus with diabetic polyneuropathy: Secondary | ICD-10-CM

## 2019-04-23 DIAGNOSIS — B351 Tinea unguium: Secondary | ICD-10-CM | POA: Diagnosis not present

## 2019-04-23 DIAGNOSIS — M79675 Pain in left toe(s): Secondary | ICD-10-CM | POA: Diagnosis not present

## 2019-04-23 MED ORDER — FUROSEMIDE 20 MG PO TABS: 30.0000 mg | ORAL_TABLET | Freq: Every day | ORAL | 3 refills | Status: DC

## 2019-04-23 NOTE — Patient Instructions (Signed)
Diabetes Mellitus and Foot Care Foot care is an important part of your health, especially when you have diabetes. Diabetes may cause you to have problems because of poor blood flow (circulation) to your feet and legs, which can cause your skin to:  Become thinner and drier.  Break more easily.  Heal more slowly.  Peel and crack. You may also have nerve damage (neuropathy) in your legs and feet, causing decreased feeling in them. This means that you may not notice minor injuries to your feet that could lead to more serious problems. Noticing and addressing any potential problems early is the best way to prevent future foot problems. How to care for your feet Foot hygiene  Wash your feet daily with warm water and mild soap. Do not use hot water. Then, pat your feet and the areas between your toes until they are completely dry. Do not soak your feet as this can dry your skin.  Trim your toenails straight across. Do not dig under them or around the cuticle. File the edges of your nails with an emery board or nail file.  Apply a moisturizing lotion or petroleum jelly to the skin on your feet and to dry, brittle toenails. Use lotion that does not contain alcohol and is unscented. Do not apply lotion between your toes. Shoes and socks  Wear clean socks or stockings every day. Make sure they are not too tight. Do not wear knee-high stockings since they may decrease blood flow to your legs.  Wear shoes that fit properly and have enough cushioning. Always look in your shoes before you put them on to be sure there are no objects inside.  To break in new shoes, wear them for just a few hours a day. This prevents injuries on your feet. Wounds, scrapes, corns, and calluses  Check your feet daily for blisters, cuts, bruises, sores, and redness. If you cannot see the bottom of your feet, use a mirror or ask someone for help.  Do not cut corns or calluses or try to remove them with medicine.  If you  find a minor scrape, cut, or break in the skin on your feet, keep it and the skin around it clean and dry. You may clean these areas with mild soap and water. Do not clean the area with peroxide, alcohol, or iodine.  If you have a wound, scrape, corn, or callus on your foot, look at it several times a day to make sure it is healing and not infected. Check for: ? Redness, swelling, or pain. ? Fluid or blood. ? Warmth. ? Pus or a bad smell. General instructions  Do not cross your legs. This may decrease blood flow to your feet.  Do not use heating pads or hot water bottles on your feet. They may burn your skin. If you have lost feeling in your feet or legs, you may not know this is happening until it is too late.  Protect your feet from hot and cold by wearing shoes, such as at the beach or on hot pavement.  Schedule a complete foot exam at least once a year (annually) or more often if you have foot problems. If you have foot problems, report any cuts, sores, or bruises to your health care provider immediately. Contact a health care provider if:  You have a medical condition that increases your risk of infection and you have any cuts, sores, or bruises on your feet.  You have an injury that is not   healing.  You have redness on your legs or feet.  You feel burning or tingling in your legs or feet.  You have pain or cramps in your legs and feet.  Your legs or feet are numb.  Your feet always feel cold.  You have pain around a toenail. Get help right away if:  You have a wound, scrape, corn, or callus on your foot and: ? You have pain, swelling, or redness that gets worse. ? You have fluid or blood coming from the wound, scrape, corn, or callus. ? Your wound, scrape, corn, or callus feels warm to the touch. ? You have pus or a bad smell coming from the wound, scrape, corn, or callus. ? You have a fever. ? You have a red line going up your leg. Summary  Check your feet every day  for cuts, sores, red spots, swelling, and blisters.  Moisturize feet and legs daily.  Wear shoes that fit properly and have enough cushioning.  If you have foot problems, report any cuts, sores, or bruises to your health care provider immediately.  Schedule a complete foot exam at least once a year (annually) or more often if you have foot problems. This information is not intended to replace advice given to you by your health care provider. Make sure you discuss any questions you have with your health care provider. Document Released: 07/19/2000 Document Revised: 09/03/2017 Document Reviewed: 08/23/2016 Elsevier Patient Education  2020 [employer]   Onychomycosis/Fungal Toenails  WHAT IS IT? An infection that lies within the keratin of your nail plate that is caused by a fungus.  WHY ME? Fungal infections affect all ages, sexes, races, and creeds.  There may be many factors that predispose you to a fungal infection such as age, coexisting medical conditions such as diabetes, or an autoimmune disease; stress, medications, fatigue, genetics, etc.  Bottom line: fungus thrives in a warm, moist environment and your shoes offer such a location.  IS IT CONTAGIOUS? Theoretically, yes.  You do not want to share shoes, nail clippers or files with someone who has fungal toenails.  Walking around barefoot in the same room or sleeping in the same bed is unlikely to transfer the organism.  It is important to realize, however, that fungus can spread easily from one nail to the next on the same foot.  HOW DO WE TREAT THIS?  There are several ways to treat this condition.  Treatment may depend on many factors such as age, medications, pregnancy, liver and kidney conditions, etc.  It is best to ask your doctor which options are available to you.  1. No treatment.   Unlike many other medical concerns, you can live with this condition.  However for many people this can be a painful condition and may lead to  ingrown toenails or a bacterial infection.  It is recommended that you keep the nails cut short to help reduce the amount of fungal nail. 2. Topical treatment.  These range from herbal remedies to prescription strength nail lacquers.  About 40-50% effective, topicals require twice daily application for approximately 9 to 12 months or until an entirely new nail has grown out.  The most effective topicals are medical grade medications available through physicians offices. 3. Oral antifungal medications.  With an 80-90% cure rate, the most common oral medication requires 3 to 4 months of therapy and stays in your system for a year as the new nail grows out.  Oral antifungal medications do require   blood work to make sure it is a safe drug for you.  A liver function panel will be performed prior to starting the medication and after the first month of treatment.  It is important to have the blood work performed to avoid any harmful side effects.  In general, this medication safe but blood work is required. 4. Laser Therapy.  This treatment is performed by applying a specialized laser to the affected nail plate.  This therapy is noninvasive, fast, and non-painful.  It is not covered by insurance and is therefore, out of pocket.  The results have been very good with a 80-95% cure rate.  [facility] is the only practice in the area to offer this therapy. 5. Permanent Nail Avulsion.  Removing the entire nail so that a new nail will not grow back. 

## 2019-04-27 DIAGNOSIS — E113512 Type 2 diabetes mellitus with proliferative diabetic retinopathy with macular edema, left eye: Secondary | ICD-10-CM | POA: Diagnosis not present

## 2019-04-29 NOTE — Progress Notes (Signed)
Subjective: Mark L Gerdeman Sr. presents with diabetes, diabetic neuropathy for preventative diabetic foot care with calluses and elongated, painful, discolored, thick toenails which interfere with activities of daily living. Pain is aggravated when wearing enclosed shoe gear. Pain is relieved with periodic professional debridement.  He voices no new pedal problems on today's visit.  [name], MD is his PCP. Last visit was 04/09/2019.  Current Outpatient Medications on File Prior to Visit  Medication Sig Dispense Refill  . acetaminophen (TYLENOL) 325 MG tablet Take 2 tablets (650 mg total) by mouth every 6 (six) hours as needed for mild pain (or Fever >/= 101).    Marland Kitchen albuterol (PROVENTIL HFA;VENTOLIN HFA) 108 (90 Base) MCG/ACT inhaler Inhale 2 puffs into the lungs every 4 (four) hours as needed for wheezing or shortness of breath. 1 Inhaler 0  . amLODipine (NORVASC) 10 MG tablet Take 1 tablet (10 mg total) by mouth daily. 90 tablet 3  . aspirin EC 81 MG tablet Take 81 mg by mouth at bedtime.     . carvedilol (COREG) 25 MG tablet Take 1 tablet (25 mg total) by mouth 2 (two) times daily with a meal. 90 tablet 3  . citalopram (CELEXA) 20 MG tablet TAKE 1 TABLET(20 MG) BY MOUTH DAILY 90 tablet 3  . diazepam (VALIUM) 5 MG tablet TAKE 1 TABLET BY MOUTH TWICE DAILY AS NEEDED FOR ANXIETY 60 tablet 2  . doxazosin (CARDURA) 8 MG tablet TAKE 1 TABLET(8 MG) BY MOUTH DAILY 90 tablet 3  . escitalopram (LEXAPRO) 10 MG tablet Take 1 tablet (10 mg total) by mouth daily. 30 tablet 3  . gabapentin (NEURONTIN) 300 MG capsule     . insulin NPH Human (HUMULIN N,NOVOLIN N) 100 UNIT/ML injection Inject 8-12 Units into the skin at bedtime. Take 12 units qam and 10 units qhs    . insulin regular (NOVOLIN R,HUMULIN R) 100 units/mL injection Inject 8 Units into the skin 3 (three) times daily before meals. 12u qam - 8u qhs    . metFORMIN (GLUCOPHAGE) 500 MG tablet Take 1,000 mg by mouth 2 (two) times daily with a  meal.    . metFORMIN (GLUCOPHAGE-XR) 500 MG 24 hr tablet     . Multiple Vitamin (MULTIVITAMIN WITH MINERALS) TABS Take 1 tablet by mouth daily.    Marland Kitchen oxyCODONE (ROXICODONE) 5 MG immediate release tablet Take 1 tablet (5 mg total) by mouth every 4 (four) hours as needed for severe pain. 60 tablet 0  . pravastatin (PRAVACHOL) 10 MG tablet TAKE 1 TABLET(10 MG) BY MOUTH DAILY WITH BREAKFAST 90 tablet 3  . ZENPEP 10000-32000 units CPEP TAKE 1 CAPSULE BY MOUTH THREE TIMES DAILY BEFORE MEALS (Patient taking differently: Take 1 capsule by mouth 3 (three) times daily before meals. ) 180 capsule 3   No current facility-administered medications on file prior to visit.     Allergies  Allergen Reactions  . Enalapril Maleate Cough    Objective: Vascular Examination: Capillary refill time immediate x 10 digits.  Dorsalis pedis pulses absent b/l.  Posterior tibial pulses palpable b/l.  Digital hair absent x 10 digits.  Skin temperature gradient warm to cool b/l.  Dermatological Examination: Skin thin and atrophic b/l.  Toenails 1-5 b/l discolored, thick, dystrophic with subungual debris and pain with palpation to nailbeds due to thickness of nails.  Hyperkeratotic lesions submet head 5 b/l. No erythema, no edema, no drainage, no flocculence noted.   Musculoskeletal: Muscle strength 5/5 b/l to all LE muscle groups.  No pain, crepitus or joint limitation with passive/active ROM.  Neurological: Sensation diminished with 10 gram monofilament bilaterally.  Vibratory sensation diminished bilaterally.  Assessment: 1. Painful onychomycosis toenails 1-5 b/l 2. Calluses submet head 5 b/l 3. NIDDM with Diabetic neuropathy  Plan: 1. Continue diabetic foot care principles. Literature dispensed on today. 2. Toenails 1-5 b/l were debrided in length and girth without iatrogenic bleeding. 3. Calluses pared submetatarsal head 5 b/l utilizing sterile scalpel blade without incident. 4. Patient to  continue soft, supportive shoe gear. 5. Patient to report any pedal injuries to medical professional. 6. Follow up 3 months.  7. Patient/POA to call should there be a concern in the interim.

## 2019-05-06 ENCOUNTER — Encounter: Payer: Self-pay | Admitting: Family Medicine

## 2019-05-06 DIAGNOSIS — K7581 Nonalcoholic steatohepatitis (NASH): Secondary | ICD-10-CM

## 2019-05-06 MED ORDER — OXYCODONE HCL 5 MG PO TABS: 5.0000 mg | ORAL_TABLET | ORAL | 0 refills | Status: DC | PRN

## 2019-05-06 NOTE — Telephone Encounter (Signed)
Patient requesting a refill on Oxycodone     LOV:  04/09/2019  LRF:   03/29/19

## 2019-05-07 ENCOUNTER — Encounter: Payer: Self-pay | Admitting: Family Medicine

## 2019-05-19 ENCOUNTER — Other Ambulatory Visit: Payer: Self-pay

## 2019-05-19 DIAGNOSIS — K861 Other chronic pancreatitis: Secondary | ICD-10-CM

## 2019-05-19 MED ORDER — ZENPEP 10000-32000 UNITS PO CPEP: 1.0000 | ORAL_CAPSULE | Freq: Three times a day (TID) | ORAL | 0 refills | Status: DC

## 2019-06-08 ENCOUNTER — Encounter: Payer: Self-pay | Admitting: Family Medicine

## 2019-06-08 DIAGNOSIS — K7581 Nonalcoholic steatohepatitis (NASH): Secondary | ICD-10-CM

## 2019-06-08 MED ORDER — OXYCODONE HCL 5 MG PO TABS: 5.0000 mg | ORAL_TABLET | ORAL | 0 refills | Status: DC | PRN

## 2019-06-08 NOTE — Telephone Encounter (Signed)
Patient requesting a refill on Oxycodone     LOV:  04/09/19  LRF:     05/06/19

## 2019-06-14 ENCOUNTER — Other Ambulatory Visit: Payer: Self-pay | Admitting: Family Medicine

## 2019-06-14 DIAGNOSIS — E1142 Type 2 diabetes mellitus with diabetic polyneuropathy: Secondary | ICD-10-CM | POA: Diagnosis not present

## 2019-06-22 DIAGNOSIS — E113313 Type 2 diabetes mellitus with moderate nonproliferative diabetic retinopathy with macular edema, bilateral: Secondary | ICD-10-CM | POA: Diagnosis not present

## 2019-06-27 ENCOUNTER — Other Ambulatory Visit: Payer: Self-pay | Admitting: Family Medicine

## 2019-06-30 ENCOUNTER — Other Ambulatory Visit: Payer: Self-pay

## 2019-07-05 DIAGNOSIS — E11649 Type 2 diabetes mellitus with hypoglycemia without coma: Secondary | ICD-10-CM | POA: Diagnosis not present

## 2019-07-06 ENCOUNTER — Encounter: Payer: Self-pay | Admitting: Family Medicine

## 2019-07-06 DIAGNOSIS — K7581 Nonalcoholic steatohepatitis (NASH): Secondary | ICD-10-CM

## 2019-07-06 MED ORDER — OXYCODONE HCL 5 MG PO TABS: 5.0000 mg | ORAL_TABLET | ORAL | 0 refills | Status: DC | PRN

## 2019-07-06 NOTE — Telephone Encounter (Signed)
Ok to refill??  Last office visit 04/09/2019.  Last refill 06/08/2019.

## 2019-07-09 ENCOUNTER — Other Ambulatory Visit: Payer: Self-pay | Admitting: Family Medicine

## 2019-07-09 DIAGNOSIS — N1832 Chronic kidney disease, stage 3b: Secondary | ICD-10-CM

## 2019-07-20 ENCOUNTER — Other Ambulatory Visit: Payer: PPO

## 2019-07-20 ENCOUNTER — Other Ambulatory Visit: Payer: Self-pay

## 2019-07-20 DIAGNOSIS — E782 Mixed hyperlipidemia: Secondary | ICD-10-CM

## 2019-07-20 DIAGNOSIS — N1832 Chronic kidney disease, stage 3b: Secondary | ICD-10-CM | POA: Diagnosis not present

## 2019-07-20 DIAGNOSIS — I10 Essential (primary) hypertension: Secondary | ICD-10-CM

## 2019-07-20 DIAGNOSIS — E1165 Type 2 diabetes mellitus with hyperglycemia: Secondary | ICD-10-CM | POA: Diagnosis not present

## 2019-07-21 LAB — CBC WITH DIFFERENTIAL/PLATELET
Absolute Monocytes: 512 cells/uL (ref 200–950)
Basophils Absolute: 49 cells/uL (ref 0–200)
Basophils Relative: 0.8 %
Eosinophils Absolute: 201 cells/uL (ref 15–500)
Eosinophils Relative: 3.3 %
HCT: 36 % — ABNORMAL LOW (ref 38.5–50.0)
Hemoglobin: 11.5 g/dL — ABNORMAL LOW (ref 13.2–17.1)
Lymphs Abs: 1708 cells/uL (ref 850–3900)
MCH: 30.2 pg (ref 27.0–33.0)
MCHC: 31.9 g/dL — ABNORMAL LOW (ref 32.0–36.0)
MCV: 94.5 fL (ref 80.0–100.0)
MPV: 10.3 fL (ref 7.5–12.5)
Monocytes Relative: 8.4 %
Neutro Abs: 3630 cells/uL (ref 1500–7800)
Neutrophils Relative %: 59.5 %
Platelets: 177 10*3/uL (ref 140–400)
RBC: 3.81 10*6/uL — ABNORMAL LOW (ref 4.20–5.80)
RDW: 12.8 % (ref 11.0–15.0)
Total Lymphocyte: 28 %
WBC: 6.1 10*3/uL (ref 3.8–10.8)

## 2019-07-21 LAB — LIPID PANEL
Cholesterol: 103 mg/dL (ref <200)
HDL: 38 mg/dL — ABNORMAL LOW (ref >=40)
LDL Cholesterol (Calc): 49 mg/dL (calc)
Non-HDL Cholesterol (Calc): 65 mg/dL (calc) (ref <130)
Total CHOL/HDL Ratio: 2.7 (calc) (ref <5.0)
Triglycerides: 81 mg/dL (ref <150)

## 2019-07-21 LAB — COMPREHENSIVE METABOLIC PANEL
AG Ratio: 1.4 (calc) (ref 1.0–2.5)
ALT: 35 U/L (ref 9–46)
AST: 24 U/L (ref 10–35)
Albumin: 3.6 g/dL (ref 3.6–5.1)
Alkaline phosphatase (APISO): 147 U/L — ABNORMAL HIGH (ref 35–144)
BUN/Creatinine Ratio: 21 (calc) (ref 6–22)
BUN: 38 mg/dL — ABNORMAL HIGH (ref 7–25)
CO2: 22 mmol/L (ref 20–32)
Calcium: 8.2 mg/dL — ABNORMAL LOW (ref 8.6–10.3)
Chloride: 110 mmol/L (ref 98–110)
Creat: 1.85 mg/dL — ABNORMAL HIGH (ref 0.70–1.18)
Globulin: 2.6 g/dL (calc) (ref 1.9–3.7)
Glucose, Bld: 135 mg/dL — ABNORMAL HIGH (ref 65–99)
Potassium: 4.7 mmol/L (ref 3.5–5.3)
Sodium: 140 mmol/L (ref 135–146)
Total Bilirubin: 0.4 mg/dL (ref 0.2–1.2)
Total Protein: 6.2 g/dL (ref 6.1–8.1)

## 2019-07-21 LAB — HEMOGLOBIN A1C
Hgb A1c MFr Bld: 7.3 % of total Hgb — ABNORMAL HIGH (ref <5.7)
Mean Plasma Glucose: 163 (calc)
eAG (mmol/L): 9 (calc)

## 2019-07-23 ENCOUNTER — Ambulatory Visit: Payer: PPO | Admitting: Podiatry

## 2019-07-23 ENCOUNTER — Encounter: Payer: Self-pay | Admitting: Podiatry

## 2019-07-23 ENCOUNTER — Other Ambulatory Visit: Payer: Self-pay

## 2019-07-23 DIAGNOSIS — B351 Tinea unguium: Secondary | ICD-10-CM | POA: Diagnosis not present

## 2019-07-23 DIAGNOSIS — M79674 Pain in right toe(s): Secondary | ICD-10-CM

## 2019-07-23 DIAGNOSIS — E1142 Type 2 diabetes mellitus with diabetic polyneuropathy: Secondary | ICD-10-CM | POA: Diagnosis not present

## 2019-07-23 DIAGNOSIS — L84 Corns and callosities: Secondary | ICD-10-CM | POA: Diagnosis not present

## 2019-07-23 DIAGNOSIS — M79675 Pain in left toe(s): Secondary | ICD-10-CM

## 2019-07-23 NOTE — Patient Instructions (Signed)
Diabetes Mellitus and Foot Care Foot care is an important part of your health, especially when you have diabetes. Diabetes may cause you to have problems because of poor blood flow (circulation) to your feet and legs, which can cause your skin to:  Become thinner and drier.  Break more easily.  Heal more slowly.  Peel and crack. You may also have nerve damage (neuropathy) in your legs and feet, causing decreased feeling in them. This means that you may not notice minor injuries to your feet that could lead to more serious problems. Noticing and addressing any potential problems early is the best way to prevent future foot problems. How to care for your feet Foot hygiene  Wash your feet daily with warm water and mild soap. Do not use hot water. Then, pat your feet and the areas between your toes until they are completely dry. Do not soak your feet as this can dry your skin.  Trim your toenails straight across. Do not dig under them or around the cuticle. File the edges of your nails with an emery board or nail file.  Apply a moisturizing lotion or petroleum jelly to the skin on your feet and to dry, brittle toenails. Use lotion that does not contain alcohol and is unscented. Do not apply lotion between your toes. Shoes and socks  Wear clean socks or stockings every day. Make sure they are not too tight. Do not wear knee-high stockings since they may decrease blood flow to your legs.  Wear shoes that fit properly and have enough cushioning. Always look in your shoes before you put them on to be sure there are no objects inside.  To break in new shoes, wear them for just a few hours a day. This prevents injuries on your feet. Wounds, scrapes, corns, and calluses  Check your feet daily for blisters, cuts, bruises, sores, and redness. If you cannot see the bottom of your feet, use a mirror or ask someone for help.  Do not cut corns or calluses or try to remove them with medicine.  If you  find a minor scrape, cut, or break in the skin on your feet, keep it and the skin around it clean and dry. You may clean these areas with mild soap and water. Do not clean the area with peroxide, alcohol, or iodine.  If you have a wound, scrape, corn, or callus on your foot, look at it several times a day to make sure it is healing and not infected. Check for: ? Redness, swelling, or pain. ? Fluid or blood. ? Warmth. ? Pus or a bad smell. General instructions  Do not cross your legs. This may decrease blood flow to your feet.  Do not use heating pads or hot water bottles on your feet. They may burn your skin. If you have lost feeling in your feet or legs, you may not know this is happening until it is too late.  Protect your feet from hot and cold by wearing shoes, such as at the beach or on hot pavement.  Schedule a complete foot exam at least once a year (annually) or more often if you have foot problems. If you have foot problems, report any cuts, sores, or bruises to your health care provider immediately. Contact a health care provider if:  You have a medical condition that increases your risk of infection and you have any cuts, sores, or bruises on your feet.  You have an injury that is not   healing.  You have redness on your legs or feet.  You feel burning or tingling in your legs or feet.  You have pain or cramps in your legs and feet.  Your legs or feet are numb.  Your feet always feel cold.  You have pain around a toenail. Get help right away if:  You have a wound, scrape, corn, or callus on your foot and: ? You have pain, swelling, or redness that gets worse. ? You have fluid or blood coming from the wound, scrape, corn, or callus. ? Your wound, scrape, corn, or callus feels warm to the touch. ? You have pus or a bad smell coming from the wound, scrape, corn, or callus. ? You have a fever. ? You have a red line going up your leg. Summary  Check your feet every day  for cuts, sores, red spots, swelling, and blisters.  Moisturize feet and legs daily.  Wear shoes that fit properly and have enough cushioning.  If you have foot problems, report any cuts, sores, or bruises to your health care provider immediately.  Schedule a complete foot exam at least once a year (annually) or more often if you have foot problems. This information is not intended to replace advice given to you by your health care provider. Make sure you discuss any questions you have with your health care provider. Document Released: 07/19/2000 Document Revised: 09/03/2017 Document Reviewed: 08/23/2016 Elsevier Patient Education  2020 [employer]  

## 2019-07-27 ENCOUNTER — Encounter: Payer: Self-pay | Admitting: Family Medicine

## 2019-07-27 DIAGNOSIS — K7581 Nonalcoholic steatohepatitis (NASH): Secondary | ICD-10-CM

## 2019-07-27 MED ORDER — OXYCODONE HCL 5 MG PO TABS: 5.0000 mg | ORAL_TABLET | ORAL | 0 refills | Status: DC | PRN

## 2019-07-27 NOTE — Telephone Encounter (Signed)
Ok to refill??  Last office visit 04/09/2019.  Last refill 07/06/2019.

## 2019-08-02 NOTE — Progress Notes (Signed)
Subjective: Mark ASWAD WANDREY Sr. is a 78 y.o. y.o. male with h/o diabetes with neuropathy who presents today for preventative diabetic foot care. Patient has painful, elongated mycotic toenails and calluses bilaterally which pose a risk and interfere with daily activities. Pain is aggravated when wearing enclosed shoe gear and relieved with periodic professional debridement.  [name], MD is patient's PCP.  Last visit April 09, 2019  Medications reviewed in chart.  Allergies  Allergen Reactions  . Enalapril Maleate Cough    Objective: There were no vitals filed for this visit.  Vascular Examination: Capillary refill time to digits immediate bilaterally.  Dorsalis pedis pulses absent bilaterally.  Posterior tibial pulses palpable bilaterally.  Digital hair absent bilaterally.  Skin temperature gradient WNL b/l.  Dermatological Examination: Skin with normal turgor, texture and tone b/l.  Toenails 1-5 b/l discolored, thick, dystrophic with subungual debris and pain with palpation to nailbeds due to thickness of nails.  Hyperkeratotic lesion submetatarsal head 5 bilaterally.  No erythema, no edema, no drainage, no flocculence noted.   Musculoskeletal: Muscle strength 5/5 to all LE muscle groups b/l.  Neurological: Sensation intact diminished b/l with 10 gram monofilament.   Last A1c:  Hemoglobin A1C Latest Ref Rng & Units 07/20/2019 12/08/2018 12/03/2018  HGBA1C <5.7 % of total Hgb 7.3(H) 7.8(H) 7.8(H)  Some recent data might be hidden   Assessment: 1. Painful onychomycosis toenails 1-5 b/l 2.  Calluses submetatarsal head 5 bilaterally 3.  NIDDM with neuropathy  Plan: 1. No new findings.  No new orders.   2. Continue diabetic foot care principles. Literature dispensed on today. 3. Toenails 1-5 b/l were debrided in length and girth without iatrogenic bleeding. 4. Hyperkeratotic lesion(s) submetatarsal head 5 bilaterally pared with sterile scalpel blade  without incident. 5. Patient to continue soft, supportive shoe gear daily. 6. Patient to report any pedal injuries to medical professional immediately. 7. Follow up 3 months.  8. Patient/POA to call should there be a concern in the interim.

## 2019-08-05 ENCOUNTER — Other Ambulatory Visit: Payer: Self-pay | Admitting: Family Medicine

## 2019-08-12 ENCOUNTER — Encounter: Payer: Self-pay | Admitting: Family Medicine

## 2019-08-12 ENCOUNTER — Other Ambulatory Visit: Payer: Self-pay | Admitting: Family Medicine

## 2019-08-19 ENCOUNTER — Encounter: Payer: Self-pay | Admitting: Family Medicine

## 2019-08-20 ENCOUNTER — Encounter: Payer: Self-pay | Admitting: Family Medicine

## 2019-08-20 ENCOUNTER — Other Ambulatory Visit: Payer: Self-pay

## 2019-08-20 ENCOUNTER — Ambulatory Visit (INDEPENDENT_AMBULATORY_CARE_PROVIDER_SITE_OTHER): Payer: PPO | Admitting: Family Medicine

## 2019-08-20 VITALS — BP 110/60 | HR 74 | Temp 97.2°F | Resp 18 | Ht 69.0 in | Wt 202.0 lb

## 2019-08-20 DIAGNOSIS — B028 Zoster with other complications: Secondary | ICD-10-CM

## 2019-08-20 MED ORDER — VALACYCLOVIR HCL 1 G PO TABS: 1000.0000 mg | ORAL_TABLET | Freq: Three times a day (TID) | ORAL | 0 refills | Status: DC

## 2019-08-20 NOTE — Progress Notes (Signed)
Subjective:    Patient ID: [name]., male    DOB: July 11, 1941, 79 y.o.   MRN: [id number]  HPI  Patient developed a burning rash 2 days ago.  The rash starts in his midline on his right lower flank and radiates around to his lower right abdomen.  It is very mild rash however the skin itches and burns in that area.   Past Medical History:  Diagnosis Date  . Arthritis    lower back  . Chronic back pain    Disc disease  . Chronic kidney disease   . Coronary atherosclerosis of native coronary artery    Coronary calcifications by chest CT, Myoview demonstrating inferolateral scar  . Deafness in right ear   . Diabetes mellitus   . Diabetic retinopathy ([place])    moderate nonproliferative with macular edema in right eye  . Diastolic dysfunction 10/7941   Grade 1.  . Essential hypertension, benign   . Heart murmur    in past  . HOH (hard of hearing)   . Hyperkalemia   . Kidney stones   . Mixed hyperlipidemia   . NAFLD (nonalcoholic fatty liver disease)   . Pancreatic insufficiency    Diagnosed at [facility]  . Prostate cancer ([place]) 2011  . Shortness of breath dyspnea    occasional - liver pressing on right lung, decreased capacity  . Subdural hematoma ([place])   . Type 2 diabetes mellitus ([place])   . Wears hearing aid    "crossover" form right to left   Past Surgical History:  Procedure Laterality Date  . APPENDECTOMY    . Bilateral hip replacement    . CATARACT EXTRACTION W/PHACO Left 05/15/2015   Procedure: CATARACT EXTRACTION PHACO AND INTRAOCULAR LENS PLACEMENT (IOC);  Surgeon: Ronnell Freshwater, MD;  Location: [place] [facility];  Service: Ophthalmology;  Laterality: Left;  DIABETIC - insulin pump and oral meds  . CATARACT EXTRACTION W/PHACO Right 08/28/2015   Procedure: CATARACT EXTRACTION PHACO AND INTRAOCULAR LENS PLACEMENT (IOC);  Surgeon: Ronnell Freshwater, MD;  Location: [facility];  Service: Ophthalmology;  Laterality: Right;  DIABETIC PER  PT AND CINDY PLEASE KEEP ARRIVAL TIME AFTER 8AM   . CHOLECYSTECTOMY    . HERNIA REPAIR    . PROSTATECTOMY  2011   Current Outpatient Medications on File Prior to Visit  Medication Sig Dispense Refill  . acetaminophen (TYLENOL) 325 MG tablet Take 2 tablets (650 mg total) by mouth every 6 (six) hours as needed for mild pain (or Fever >/= 101).    Marland Kitchen albuterol (PROVENTIL HFA;VENTOLIN HFA) 108 (90 Base) MCG/ACT inhaler Inhale 2 puffs into the lungs every 4 (four) hours as needed for wheezing or shortness of breath. 1 Inhaler 0  . amLODipine (NORVASC) 10 MG tablet Take 1 tablet (10 mg total) by mouth daily. 90 tablet 3  . aspirin EC 81 MG tablet Take 81 mg by mouth at bedtime.     . carvedilol (COREG) 25 MG tablet TAKE 1 TABLET(25 MG) BY MOUTH TWICE DAILY WITH A MEAL 90 tablet 3  . citalopram (CELEXA) 20 MG tablet TAKE 1 TABLET(20 MG) BY MOUTH DAILY 90 tablet 3  . diazepam (VALIUM) 5 MG tablet TAKE 1 TABLET BY MOUTH TWICE DAILY AS NEEDED FOR ANXIETY 60 tablet 2  . doxazosin (CARDURA) 8 MG tablet TAKE 1 TABLET(8 MG) BY MOUTH DAILY 90 tablet 3  . escitalopram (LEXAPRO) 10 MG tablet TAKE 1 TABLET(10 MG) BY MOUTH DAILY 30 tablet 3  .  furosemide (LASIX) 20 MG tablet Take 1.5 tablets (30 mg total) by mouth daily. 135 tablet 3  . gabapentin (NEURONTIN) 300 MG capsule TAKE 2 CAPSULES(600 MG) BY MOUTH AT BEDTIME 180 capsule 3  . insulin glargine (LANTUS) 100 UNIT/ML injection Inject into the skin.    Marland Kitchen insulin NPH Human (HUMULIN N,NOVOLIN N) 100 UNIT/ML injection Inject 8-12 Units into the skin at bedtime. Take 12 units qam and 10 units qhs    . insulin regular (NOVOLIN R,HUMULIN R) 100 units/mL injection Inject 8 Units into the skin 3 (three) times daily before meals. 12u qam - 8u qhs    . ketoconazole (NIZORAL) 2 % cream     . LANTUS 100 UNIT/ML injection SMARTSIG:18 Unit(s) SUB-Q Daily    . metFORMIN (GLUCOPHAGE) 500 MG tablet Take 1,000 mg by mouth 2 (two) times daily with a meal.    . metFORMIN  (GLUCOPHAGE-XR) 500 MG 24 hr tablet     . Multiple Vitamin (MULTIVITAMIN WITH MINERALS) TABS Take 1 tablet by mouth daily.    Marland Kitchen oxyCODONE (ROXICODONE) 5 MG immediate release tablet Take 1 tablet (5 mg total) by mouth every 4 (four) hours as needed for severe pain. 60 tablet 0  . Pancrelipase, Lip-Prot-Amyl, (ZENPEP) 10000-32000 units CPEP Take 1 capsule by mouth 3 (three) times daily before meals. 270 capsule 0  . pravastatin (PRAVACHOL) 10 MG tablet TAKE 1 TABLET(10 MG) BY MOUTH DAILY WITH BREAKFAST 90 tablet 3   No current facility-administered medications on file prior to visit.   Allergies  Allergen Reactions  . Enalapril Maleate Cough   Social History   Socioeconomic History  . Marital status: Married    Spouse name: Not on file  . Number of children: Not on file  . Years of education: Not on file  . Highest education level: Not on file  Occupational History  . Not on file  Tobacco Use  . Smoking status: Never Smoker  . Smokeless tobacco: Never Used  Substance and Sexual Activity  . Alcohol use: No  . Drug use: No  . Sexual activity: Not on file  Other Topics Concern  . Not on file  Social History Narrative  . Not on file   Social Determinants of Health   Financial Resource Strain:   . Difficulty of Paying Living Expenses: Not on file  Food Insecurity:   . Worried About Charity fundraiser in the Last Year: Not on file  . Ran Out of Food in the Last Year: Not on file  Transportation Needs:   . Lack of Transportation (Medical): Not on file  . Lack of Transportation (Non-Medical): Not on file  Physical Activity:   . Days of Exercise per Week: Not on file  . Minutes of Exercise per Session: Not on file  Stress:   . Feeling of Stress : Not on file  Social Connections:   . Frequency of Communication with Friends and Family: Not on file  . Frequency of Social Gatherings with Friends and Family: Not on file  . Attends Religious Services: Not on file  . Active Member  of Clubs or Organizations: Not on file  . Attends Archivist Meetings: Not on file  . Marital Status: Not on file  Intimate Partner Violence:   . Fear of Current or Ex-Partner: Not on file  . Emotionally Abused: Not on file  . Physically Abused: Not on file  . Sexually Abused: Not on file     Review of Systems  All other systems reviewed and are negative.      Objective:   Physical Exam Vitals reviewed.  Constitutional:      General: He is not in acute distress.    Appearance: Normal appearance. He is obese. He is not ill-appearing or toxic-appearing.  Cardiovascular:     Rate and Rhythm: Normal rate and regular rhythm.  Pulmonary:     Effort: Pulmonary effort is normal.     Breath sounds: Normal breath sounds.  Skin:    Findings: Rash present.  Neurological:     Mental Status: He is alert.           Assessment & Plan:  Herpes zoster with complication  Patient appears to have developed shingles.  I have recommended Valtrex 1 g p.o. 3 times daily for 7 days.  He already has pain medication at home that he can use if it begins to hurt.  We discussed strategies to prevent spread to people who have not had chickenpox by avoiding close contact.  Patient has COVID-19 vaccine scheduled for January 25.  I do not feel that this will cause any contraindications to that and encouraged him to get the vaccine.

## 2019-08-23 DIAGNOSIS — E113313 Type 2 diabetes mellitus with moderate nonproliferative diabetic retinopathy with macular edema, bilateral: Secondary | ICD-10-CM | POA: Diagnosis not present

## 2019-08-24 ENCOUNTER — Encounter: Payer: Self-pay | Admitting: Family Medicine

## 2019-08-24 DIAGNOSIS — K7581 Nonalcoholic steatohepatitis (NASH): Secondary | ICD-10-CM

## 2019-08-24 MED ORDER — OXYCODONE HCL 5 MG PO TABS: 5.0000 mg | ORAL_TABLET | ORAL | 0 refills | Status: DC | PRN

## 2019-08-24 NOTE — Telephone Encounter (Signed)
Patient requesting a refill on Oxycodone     LOV: 08/10/2019  LRF:  07/27/2019

## 2019-08-30 ENCOUNTER — Ambulatory Visit: Payer: PPO | Attending: Internal Medicine

## 2019-08-30 DIAGNOSIS — Z23 Encounter for immunization: Secondary | ICD-10-CM | POA: Insufficient documentation

## 2019-08-30 NOTE — Progress Notes (Signed)
   Covid-19 Vaccination Clinic  Name:  Mark NOREEN Sr.    MRN: [id number] DOB: January 04, 1941  08/30/2019  [name] was observed post Covid-19 immunization for 15 minutes without incidence. He was provided with Vaccine Information Sheet and instruction to access the V-Safe system.   [name] was instructed to call 911 with any severe reactions post vaccine: Marland Kitchen Difficulty breathing  . Swelling of your face and throat  . A fast heartbeat  . A bad rash all over your body  . Dizziness and weakness    Immunizations Administered    Name Date Dose VIS Date Route   Pfizer COVID-19 Vaccine 08/30/2019 11:36 AM 0.3 mL 07/16/2019 Intramuscular   Manufacturer: [place]   Lot: [id number]   [place]: [address]-5

## 2019-08-31 ENCOUNTER — Encounter: Payer: Self-pay | Admitting: Family Medicine

## 2019-09-06 ENCOUNTER — Other Ambulatory Visit: Payer: Self-pay | Admitting: Family Medicine

## 2019-09-06 NOTE — Telephone Encounter (Signed)
Ok to refill??  Last office visit 08/20/2019.  Last refill 04/16/2019, #2 refills.

## 2019-09-17 ENCOUNTER — Ambulatory Visit (INDEPENDENT_AMBULATORY_CARE_PROVIDER_SITE_OTHER): Payer: PPO | Admitting: Family Medicine

## 2019-09-17 ENCOUNTER — Other Ambulatory Visit: Payer: Self-pay

## 2019-09-17 ENCOUNTER — Encounter: Payer: Self-pay | Admitting: Family Medicine

## 2019-09-17 VITALS — BP 100/58 | HR 80 | Temp 97.2°F | Resp 16 | Ht 69.0 in | Wt 203.0 lb

## 2019-09-17 DIAGNOSIS — R4182 Altered mental status, unspecified: Secondary | ICD-10-CM

## 2019-09-17 LAB — URINALYSIS, ROUTINE W REFLEX MICROSCOPIC
Bacteria, UA: NONE SEEN /HPF
Bilirubin Urine: NEGATIVE
Hgb urine dipstick: NEGATIVE
Ketones, ur: NEGATIVE
Leukocytes,Ua: NEGATIVE
Nitrite: NEGATIVE
RBC / HPF: NONE SEEN /HPF (ref 0–2)
Specific Gravity, Urine: 1.02 (ref 1.001–1.03)
Squamous Epithelial / HPF: NONE SEEN /HPF (ref <=5)
WBC, UA: NONE SEEN /HPF (ref 0–5)
pH: 5 (ref 5.0–8.0)

## 2019-09-17 LAB — MICROSCOPIC MESSAGE

## 2019-09-17 NOTE — Progress Notes (Signed)
Subjective:    Patient ID: Mark Hose., male    DOB: 1940/08/16, 79 y.o.   MRN: [id number]  HPI Dating as far back as 2015, the patient has had episodes where he has become extremely confused and disoriented.  Patient has mild memory impairment.  At times, I am concerned that he may be accidentally taking his medication incorrectly.  He is supposed to be using oxycodone sparingly for back pain.  He is supposed to be using Valium sparingly for anxiety.  Normally he takes 2 or 3 Valium a week and perhaps 1 oxycodone a day.  However he also is using gabapentin 600 mg at night for neuropathy.  He also occasionally independently uses Benadryl to help him sleep.  I am concerned about polypharmacy potentially adding to this.  Wife states that over the last 2 weeks he has become more lethargic and more confused.  His son was found dead at home possibly due to a drug overdose.  There is stress in dealing with his daughter-in-law over the estate.  He just buried his sister yesterday.  His wife thought that he was grieving however he is now very sleepy and wanting to sleep all day.  Here today he is slurring his words slightly.  He seems extremely sleepy.  Several times during our conversation he closes his eyes only to have them startled open when I speak to him.  Urinalysis is completely normal and shows no evidence of urinary tract infection.  He has his persistent right basilar crackles however this is chronic and unchanged.  He denies any cough or chest pain or fever.  Neurologic exam today aside from his slightly lethargic state is normal.  Cranial nerves II through XII are grossly intact with muscle strength 5/5 equal and symmetric in the upper and lower extremities.  There is no asterixis on exam.  There is no pronator drift.  There is no evidence of stroke.  The patient does not appear dehydrated.  However he is administering his own medication.  This is so because his wife has been staying in another  home as we were concerned that she may have Covid.  Therefore the patient has been taking his other medications.  Usually she supervises him Past Medical History:  Diagnosis Date   Arthritis    lower back   Chronic back pain    Disc disease   Chronic kidney disease    Coronary atherosclerosis of native coronary artery    Coronary calcifications by chest CT, Myoview demonstrating inferolateral scar   Deafness in right ear    Diabetes mellitus    Diabetic retinopathy ([facility])    moderate nonproliferative with macular edema in right eye   Diastolic dysfunction 09/1192   Grade 1.   Essential hypertension, benign    Heart murmur    in past   HOH (hard of hearing)    Hyperkalemia    Kidney stones    Mixed hyperlipidemia    NAFLD (nonalcoholic fatty liver disease)    Pancreatic insufficiency    Diagnosed at [facility]   Prostate cancer [facility]) 2011   Shortness of breath dyspnea    occasional - liver pressing on right lung, decreased capacity   Subdural hematoma ([facility])    Type 2 diabetes mellitus ([place])    Wears hearing aid    "crossover" form right to left   Past Surgical History:  Procedure Laterality Date   APPENDECTOMY     Bilateral hip replacement  CATARACT EXTRACTION W/PHACO Left 05/15/2015   Procedure: CATARACT EXTRACTION PHACO AND INTRAOCULAR LENS PLACEMENT (IOC);  Surgeon: Ronnell Freshwater, MD;  Location: [facility];  Service: Ophthalmology;  Laterality: Left;  DIABETIC - insulin pump and oral meds   CATARACT EXTRACTION W/PHACO Right 08/28/2015   Procedure: CATARACT EXTRACTION PHACO AND INTRAOCULAR LENS PLACEMENT (IOC);  Surgeon: Ronnell Freshwater, MD;  Location: [facility];  Service: Ophthalmology;  Laterality: Right;  DIABETIC PER PT AND CINDY PLEASE KEEP ARRIVAL TIME AFTER 8AM    CHOLECYSTECTOMY     HERNIA REPAIR     PROSTATECTOMY  2011   Current Outpatient Medications on File Prior to Visit  Medication Sig  Dispense Refill   acetaminophen (TYLENOL) 325 MG tablet Take 2 tablets (650 mg total) by mouth every 6 (six) hours as needed for mild pain (or Fever >/= 101).     albuterol (PROVENTIL HFA;VENTOLIN HFA) 108 (90 Base) MCG/ACT inhaler Inhale 2 puffs into the lungs every 4 (four) hours as needed for wheezing or shortness of breath. 1 Inhaler 0   amLODipine (NORVASC) 10 MG tablet Take 1 tablet (10 mg total) by mouth daily. 90 tablet 3   aspirin EC 81 MG tablet Take 81 mg by mouth at bedtime.      carvedilol (COREG) 25 MG tablet TAKE 1 TABLET(25 MG) BY MOUTH TWICE DAILY WITH A MEAL 90 tablet 3   diazepam (VALIUM) 5 MG tablet TAKE 1 TABLET BY MOUTH TWICE DAILY AS NEEDED FOR ANXIETY 60 tablet 2   doxazosin (CARDURA) 8 MG tablet TAKE 1 TABLET(8 MG) BY MOUTH DAILY 90 tablet 3   escitalopram (LEXAPRO) 10 MG tablet TAKE 1 TABLET(10 MG) BY MOUTH DAILY 30 tablet 3   furosemide (LASIX) 20 MG tablet Take 1.5 tablets (30 mg total) by mouth daily. 135 tablet 3   gabapentin (NEURONTIN) 300 MG capsule TAKE 2 CAPSULES(600 MG) BY MOUTH AT BEDTIME 180 capsule 3   insulin glargine (LANTUS) 100 UNIT/ML injection Inject into the skin.     insulin NPH Human (HUMULIN N,NOVOLIN N) 100 UNIT/ML injection Inject 8-12 Units into the skin at bedtime. Take 12 units qam and 10 units qhs     insulin regular (NOVOLIN R,HUMULIN R) 100 units/mL injection Inject 8 Units into the skin 3 (three) times daily before meals. 12u qam - 8u qhs     ketoconazole (NIZORAL) 2 % cream      metFORMIN (GLUCOPHAGE) 500 MG tablet Take 1,000 mg by mouth 2 (two) times daily with a meal.     metFORMIN (GLUCOPHAGE-XR) 500 MG 24 hr tablet      Multiple Vitamin (MULTIVITAMIN WITH MINERALS) TABS Take 1 tablet by mouth daily.     oxyCODONE (ROXICODONE) 5 MG immediate release tablet Take 1 tablet (5 mg total) by mouth every 4 (four) hours as needed for severe pain. 60 tablet 0   Pancrelipase, Lip-Prot-Amyl, (ZENPEP) 10000-32000 units CPEP Take  1 capsule by mouth 3 (three) times daily before meals. 270 capsule 0   pravastatin (PRAVACHOL) 10 MG tablet TAKE 1 TABLET(10 MG) BY MOUTH DAILY WITH BREAKFAST 90 tablet 3   valACYclovir (VALTREX) 1000 MG tablet Take 1 tablet (1,000 mg total) by mouth 3 (three) times daily. 21 tablet 0   No current facility-administered medications on file prior to visit.   Allergies  Allergen Reactions   Enalapril Maleate Cough   Social History   Socioeconomic History   Marital status: Married    Spouse name: Not on file  Number of children: Not on file   Years of education: Not on file   Highest education level: Not on file  Occupational History   Not on file  Tobacco Use   Smoking status: Never Smoker   Smokeless tobacco: Never Used  Substance and Sexual Activity   Alcohol use: No   Drug use: No   Sexual activity: Not on file  Other Topics Concern   Not on file  Social History Narrative   Not on file   Social Determinants of Health   Financial Resource Strain:    Difficulty of Paying Living Expenses: Not on file  Food Insecurity:    Worried About [place] in the Last Year: Not on file   Ran Out of Food in the Last Year: Not on file  Transportation Needs:    Lack of Transportation (Medical): Not on file   Lack of Transportation (Non-Medical): Not on file  Physical Activity:    Days of Exercise per Week: Not on file   Minutes of Exercise per Session: Not on file  Stress:    Feeling of Stress : Not on file  Social Connections:    Frequency of Communication with Friends and Family: Not on file   Frequency of Social Gatherings with Friends and Family: Not on file   Attends Religious Services: Not on file   Active Member of Clubs or Organizations: Not on file   Attends Archivist Meetings: Not on file   Marital Status: Not on file  Intimate Partner Violence:    Fear of Current or Ex-Partner: Not on file   Emotionally Abused: Not  on file   Physically Abused: Not on file   Sexually Abused: Not on file      Review of Systems  All other systems reviewed and are negative.      Objective:   Physical Exam  Constitutional: He is oriented to person, place, and time. He appears well-developed and well-nourished.  Neck: No JVD present.  Cardiovascular: Normal rate, regular rhythm, normal heart sounds and intact distal pulses.  No murmur heard. Pulmonary/Chest: Effort normal. He has no decreased breath sounds. He has no wheezes. He has no rhonchi. He has rales in the right middle field.  Abdominal: Soft. Bowel sounds are normal. He exhibits no distension and no mass. There is no abdominal tenderness. There is no rebound and no guarding.  Musculoskeletal:     Cervical back: Neck supple.  Lymphadenopathy:    He has no cervical adenopathy.  Neurological: He is oriented to person, place, and time. He has normal reflexes. No cranial nerve deficit. He exhibits normal muscle tone. Coordination normal.  Psychiatric: He has a normal mood and affect. Judgment and thought content normal. His speech is slurred. He is slowed. Cognition and memory are normal.  Vitals reviewed.         Assessment & Plan:  Altered mental status, unspecified altered mental status type - Plan: Urinalysis, Routine w reflex microscopic, CBC with Differential/Platelet, COMPLETE METABOLIC PANEL WITH GFR, Ammonia  Given his nonalcoholic fatty liver disease I will check an ammonia level.  There is no asterixis on exam today.  However if his ammonia level is elevated obviously we will start him on lactulose.  Urinalysis shows no urinary tract infection and pulmonary exam does not suggest pneumonia.  Neurologic exam does not suggest a stroke.  Therefore my biggest concern is dehydration and polypharmacy and medication unintentional overuse.  I have recommended that his  wife supervise all of his medication and that she immediately stop oxycodone, Valium,  gabapentin, and Benadryl.  Recheck on Monday.  I anticipate that he will be more awake and alert by Monday.  Spent more than 25 minutes today with the patient exam performing his history

## 2019-09-18 LAB — COMPLETE METABOLIC PANEL WITH GFR
AG Ratio: 1.5 (calc) (ref 1.0–2.5)
ALT: 26 U/L (ref 9–46)
AST: 14 U/L (ref 10–35)
Albumin: 3.3 g/dL — ABNORMAL LOW (ref 3.6–5.1)
Alkaline phosphatase (APISO): 195 U/L — ABNORMAL HIGH (ref 35–144)
BUN/Creatinine Ratio: 16 (calc) (ref 6–22)
BUN: 32 mg/dL — ABNORMAL HIGH (ref 7–25)
CO2: 20 mmol/L (ref 20–32)
Calcium: 8 mg/dL — ABNORMAL LOW (ref 8.6–10.3)
Chloride: 110 mmol/L (ref 98–110)
Creat: 1.99 mg/dL — ABNORMAL HIGH (ref 0.70–1.18)
GFR, Est African American: 36 mL/min/{1.73_m2} — ABNORMAL LOW (ref >=60)
GFR, Est Non African American: 31 mL/min/{1.73_m2} — ABNORMAL LOW (ref >=60)
Globulin: 2.2 g/dL (calc) (ref 1.9–3.7)
Glucose, Bld: 310 mg/dL — ABNORMAL HIGH (ref 65–99)
Potassium: 4.4 mmol/L (ref 3.5–5.3)
Sodium: 141 mmol/L (ref 135–146)
Total Bilirubin: 0.3 mg/dL (ref 0.2–1.2)
Total Protein: 5.5 g/dL — ABNORMAL LOW (ref 6.1–8.1)

## 2019-09-18 LAB — CBC WITH DIFFERENTIAL/PLATELET
Absolute Monocytes: 496 cells/uL (ref 200–950)
Basophils Absolute: 20 cells/uL (ref 0–200)
Basophils Relative: 0.3 %
Eosinophils Absolute: 181 cells/uL (ref 15–500)
Eosinophils Relative: 2.7 %
HCT: 35.9 % — ABNORMAL LOW (ref 38.5–50.0)
Hemoglobin: 11.2 g/dL — ABNORMAL LOW (ref 13.2–17.1)
Lymphs Abs: 1179 cells/uL (ref 850–3900)
MCH: 30.5 pg (ref 27.0–33.0)
MCHC: 31.2 g/dL — ABNORMAL LOW (ref 32.0–36.0)
MCV: 97.8 fL (ref 80.0–100.0)
MPV: 10.7 fL (ref 7.5–12.5)
Monocytes Relative: 7.4 %
Neutro Abs: 4824 cells/uL (ref 1500–7800)
Neutrophils Relative %: 72 %
Platelets: 130 10*3/uL — ABNORMAL LOW (ref 140–400)
RBC: 3.67 10*6/uL — ABNORMAL LOW (ref 4.20–5.80)
RDW: 13.1 % (ref 11.0–15.0)
Total Lymphocyte: 17.6 %
WBC: 6.7 10*3/uL (ref 3.8–10.8)

## 2019-09-18 LAB — AMMONIA: Ammonia: 48 umol/L (ref <=72)

## 2019-09-20 ENCOUNTER — Ambulatory Visit: Payer: PPO | Attending: Internal Medicine

## 2019-09-20 DIAGNOSIS — Z23 Encounter for immunization: Secondary | ICD-10-CM

## 2019-09-20 NOTE — Progress Notes (Signed)
   Covid-19 Vaccination Clinic  Name:  [name] Sr.    MRN: [id number] DOB: 01/14/1941  09/20/2019  [name] was observed post Covid-19 immunization for 15 minutes without incidence. He was provided with Vaccine Information Sheet and instruction to access the V-Safe system.   Mr. Card was instructed to call 911 with any severe reactions post vaccine: Marland Kitchen Difficulty breathing  . Swelling of your face and throat  . A fast heartbeat  . A bad rash all over your body  . Dizziness and weakness    Immunizations Administered    Name Date Dose VIS Date Route   Pfizer COVID-19 Vaccine 09/20/2019 11:16 AM 0.3 mL 07/16/2019 Intramuscular   Manufacturer: [place]   Lot: [id number]   [place]: [id number]

## 2019-09-28 ENCOUNTER — Encounter: Payer: Self-pay | Admitting: Family Medicine

## 2019-10-04 DIAGNOSIS — E113512 Type 2 diabetes mellitus with proliferative diabetic retinopathy with macular edema, left eye: Secondary | ICD-10-CM | POA: Diagnosis not present

## 2019-10-05 ENCOUNTER — Ambulatory Visit (INDEPENDENT_AMBULATORY_CARE_PROVIDER_SITE_OTHER): Payer: PPO | Admitting: Family Medicine

## 2019-10-05 ENCOUNTER — Encounter: Payer: Self-pay | Admitting: Family Medicine

## 2019-10-05 ENCOUNTER — Other Ambulatory Visit: Payer: Self-pay

## 2019-10-05 VITALS — BP 144/70 | HR 80 | Temp 97.1°F | Resp 18 | Ht 69.0 in | Wt 204.0 lb

## 2019-10-05 DIAGNOSIS — F5101 Primary insomnia: Secondary | ICD-10-CM | POA: Diagnosis not present

## 2019-10-05 MED ORDER — TRAZODONE HCL 50 MG PO TABS: 50.0000 mg | ORAL_TABLET | Freq: Every evening | ORAL | 3 refills | Status: DC | PRN

## 2019-10-05 NOTE — Progress Notes (Signed)
Subjective:    Patient ID: [name]., male    DOB: [birth year], 79 y.o.   MRN: [id number]  HPI  09/17/19 Dating as far back as 2015, the patient has had episodes where he has become extremely confused and disoriented.  Patient has mild memory impairment.  At times, I am concerned that he may be accidentally taking his medication incorrectly.  He is supposed to be using oxycodone sparingly for back pain.  He is supposed to be using Valium sparingly for anxiety.  Normally he takes 2 or 3 Valium a week and perhaps 1 oxycodone a day.  However he also is using gabapentin 600 mg at night for neuropathy.  He also occasionally independently uses Benadryl to help him sleep.  I am concerned about polypharmacy potentially adding to this.  Wife states that over the last 2 weeks he has become more lethargic and more confused.  His son was found dead at home possibly due to a drug overdose.  There is stress in dealing with his daughter-in-law over the estate.  He just buried his sister yesterday.  His wife thought that he was grieving however he is now very sleepy and wanting to sleep all day.  Here today he is slurring his words slightly.  He seems extremely sleepy.  Several times during our conversation he closes his eyes only to have them startled open when I speak to him.  Urinalysis is completely normal and shows no evidence of urinary tract infection.  He has his persistent right basilar crackles however this is chronic and unchanged.  He denies any cough or chest pain or fever.  Neurologic exam today aside from his slightly lethargic state is normal.  Cranial nerves II through XII are grossly intact with muscle strength 5/5 equal and symmetric in the upper and lower extremities.  There is no asterixis on exam.  There is no pronator drift.  There is no evidence of stroke.  The patient does not appear dehydrated.  However he is administering his own medication.  This is so because his wife has been staying  in another home as we were concerned that she may have Covid.  Therefore the patient has been taking his other medications.  Usually she supervises him.  At that time, my plan was: Given his nonalcoholic fatty liver disease I will check an ammonia level.  There is no asterixis on exam today.  However if his ammonia level is elevated obviously we will start him on lactulose.  Urinalysis shows no urinary tract infection and pulmonary exam does not suggest pneumonia.  Neurologic exam does not suggest a stroke.  Therefore my biggest concern is dehydration and polypharmacy and medication unintentional overuse.  I have recommended that his wife supervise all of his medication and that she immediately stop oxycodone, Valium, gabapentin, and Benadryl.  Recheck on Monday.  I anticipate that he will be more awake and alert by Monday.  Spent more than 25 minutes today with the patient exam performing his history  10/05/19  Within a few days of discontinuing his medication, the patient's mentation cleared up and was back to his baseline quickly.  Therefore we do believe that his altered mental status was secondary to polypharmacy.  His lab work showed no other explanation aside from that.  Therefore I strongly recommended discontinuation of Valium, gabapentin, and oxycodone as well as Benadryl.  Unfortunately since he discontinued the medication he has been unable to sleep.  He states that he  is going days without getting a good nights rest.  He will go to bed at 11:00 and toss and turn till 3 or 4 in the morning.  He will occasionally get 1 or 2 hours and then quickly awaken without getting a restorative night sleep.  He is independently tried Benadryl by itself and oxycodone by itself.  The oxycodone was the only thing that helped him to sleep.  The Benadryl and the gabapentin did not.  I again cautioned the patient about polypharmacy and confusion however both the patient and his wife state that he needs something to help  him sleep. Past Medical History:  Diagnosis Date  . Arthritis    lower back  . Chronic back pain    Disc disease  . Chronic kidney disease   . Coronary atherosclerosis of native coronary artery    Coronary calcifications by chest CT, Myoview demonstrating inferolateral scar  . Deafness in right ear   . Diabetes mellitus   . Diabetic retinopathy ([place])    moderate nonproliferative with macular edema in right eye  . Diastolic dysfunction 03/4695   Grade 1.  . Essential hypertension, benign   . Heart murmur    in past  . HOH (hard of hearing)   . Hyperkalemia   . Kidney stones   . Mixed hyperlipidemia   . NAFLD (nonalcoholic fatty liver disease)   . Pancreatic insufficiency    Diagnosed at [facility]  . Prostate cancer ([place]) 2011  . Shortness of breath dyspnea    occasional - liver pressing on right lung, decreased capacity  . Subdural hematoma ([place])   . Type 2 diabetes mellitus ([place])   . Wears hearing aid    "crossover" form right to left   Past Surgical History:  Procedure Laterality Date  . APPENDECTOMY    . Bilateral hip replacement    . CATARACT EXTRACTION W/PHACO Left 05/15/2015   Procedure: CATARACT EXTRACTION PHACO AND INTRAOCULAR LENS PLACEMENT (IOC);  Surgeon: Ronnell Freshwater, MD;  Location: [place];  Service: Ophthalmology;  Laterality: Left;  DIABETIC - insulin pump and oral meds  . CATARACT EXTRACTION W/PHACO Right 08/28/2015   Procedure: CATARACT EXTRACTION PHACO AND INTRAOCULAR LENS PLACEMENT (IOC);  Surgeon: Ronnell Freshwater, MD;  Location: [place];  Service: Ophthalmology;  Laterality: Right;  DIABETIC PER PT AND CINDY PLEASE KEEP ARRIVAL TIME AFTER 8AM   . CHOLECYSTECTOMY    . HERNIA REPAIR    . PROSTATECTOMY  2011   Current Outpatient Medications on File Prior to Visit  Medication Sig Dispense Refill  . acetaminophen (TYLENOL) 325 MG tablet Take 2 tablets (650 mg total) by mouth every 6 (six) hours as needed for mild  pain (or Fever >/= 101).    Marland Kitchen albuterol (PROVENTIL HFA;VENTOLIN HFA) 108 (90 Base) MCG/ACT inhaler Inhale 2 puffs into the lungs every 4 (four) hours as needed for wheezing or shortness of breath. 1 Inhaler 0  . amLODipine (NORVASC) 10 MG tablet Take 1 tablet (10 mg total) by mouth daily. 90 tablet 3  . aspirin EC 81 MG tablet Take 81 mg by mouth at bedtime.     . carvedilol (COREG) 25 MG tablet TAKE 1 TABLET(25 MG) BY MOUTH TWICE DAILY WITH A MEAL 90 tablet 3  . doxazosin (CARDURA) 8 MG tablet TAKE 1 TABLET(8 MG) BY MOUTH DAILY 90 tablet 3  . furosemide (LASIX) 20 MG tablet Take 1.5 tablets (30 mg total) by mouth daily. 135 tablet 3  . gabapentin (  NEURONTIN) 300 MG capsule TAKE 2 CAPSULES(600 MG) BY MOUTH AT BEDTIME 180 capsule 3  . insulin glargine (LANTUS) 100 UNIT/ML injection Inject into the skin.    Marland Kitchen insulin NPH Human (HUMULIN N,NOVOLIN N) 100 UNIT/ML injection Inject 8-12 Units into the skin at bedtime. Take 12 units qam and 10 units qhs    . insulin regular (NOVOLIN R,HUMULIN R) 100 units/mL injection Inject 8 Units into the skin 3 (three) times daily before meals. 12u qam - 8u qhs    . ketoconazole (NIZORAL) 2 % cream     . metFORMIN (GLUCOPHAGE) 500 MG tablet Take 1,000 mg by mouth 2 (two) times daily with a meal.    . Multiple Vitamin (MULTIVITAMIN WITH MINERALS) TABS Take 1 tablet by mouth daily.    . Pancrelipase, Lip-Prot-Amyl, (ZENPEP) 10000-32000 units CPEP Take 1 capsule by mouth 3 (three) times daily before meals. 270 capsule 0  . pravastatin (PRAVACHOL) 10 MG tablet TAKE 1 TABLET(10 MG) BY MOUTH DAILY WITH BREAKFAST 90 tablet 3  . valACYclovir (VALTREX) 1000 MG tablet Take 1 tablet (1,000 mg total) by mouth 3 (three) times daily. 21 tablet 0   No current facility-administered medications on file prior to visit.   Allergies  Allergen Reactions  . Enalapril Maleate Cough   Social History   Socioeconomic History  . Marital status: Married    Spouse name: Not on file  .  Number of children: Not on file  . Years of education: Not on file  . Highest education level: Not on file  Occupational History  . Not on file  Tobacco Use  . Smoking status: Never Smoker  . Smokeless tobacco: Never Used  Substance and Sexual Activity  . Alcohol use: No  . Drug use: No  . Sexual activity: Not on file  Other Topics Concern  . Not on file  Social History Narrative  . Not on file   Social Determinants of Health   Financial Resource Strain:   . Difficulty of Paying Living Expenses: Not on file  Food Insecurity:   . Worried About Charity fundraiser in the Last Year: Not on file  . Ran Out of Food in the Last Year: Not on file  Transportation Needs:   . Lack of Transportation (Medical): Not on file  . Lack of Transportation (Non-Medical): Not on file  Physical Activity:   . Days of Exercise per Week: Not on file  . Minutes of Exercise per Session: Not on file  Stress:   . Feeling of Stress : Not on file  Social Connections:   . Frequency of Communication with Friends and Family: Not on file  . Frequency of Social Gatherings with Friends and Family: Not on file  . Attends Religious Services: Not on file  . Active Member of Clubs or Organizations: Not on file  . Attends Archivist Meetings: Not on file  . Marital Status: Not on file  Intimate Partner Violence:   . Fear of Current or Ex-Partner: Not on file  . Emotionally Abused: Not on file  . Physically Abused: Not on file  . Sexually Abused: Not on file      Review of Systems  All other systems reviewed and are negative.      Objective:   Physical Exam  Constitutional: He is oriented to person, place, and time. He appears well-developed and well-nourished.  Neck: No JVD present.  Cardiovascular: Normal rate, regular rhythm, normal heart sounds and intact distal pulses.  No murmur heard. Pulmonary/Chest: Effort normal. He has no decreased breath sounds. He has no wheezes. He has no  rhonchi. He has rales in the right middle field.  Musculoskeletal:     Cervical back: Neck supple.  Lymphadenopathy:    He has no cervical adenopathy.  Neurological: He is oriented to person, place, and time. He has normal reflexes. No cranial nerve deficit. He exhibits normal muscle tone. Coordination normal.  Psychiatric: He has a normal mood and affect. His speech is normal and behavior is normal. Judgment and thought content normal. His mood appears not anxious. His speech is not slurred. He is not agitated, not slowed, not withdrawn and not actively hallucinating. Cognition and memory are normal. He does not exhibit a depressed mood. He is attentive.  Vitals reviewed.         Assessment & Plan:  Primary insomnia  I am convinced that his altered mental status and confusion was due to polypharmacy on top of mild dementia coupled with grieving.  Therefore I want to avoid polypharmacy.  We discussed the risk and benefits in both the patient and his wife would like to try a mild sleep aid to help him get some sleep.  After discussing his possible options he elects to try trazodone 50 mg p.o. nightly.  We will try this for a few days and see if he notices any improvement.  If not I would like to try possibly a low-dose Xanax or Ativan at night.  Reassess next week

## 2019-10-07 ENCOUNTER — Encounter: Payer: Self-pay | Admitting: Family Medicine

## 2019-10-25 ENCOUNTER — Other Ambulatory Visit: Payer: Self-pay

## 2019-10-25 ENCOUNTER — Ambulatory Visit: Payer: PPO | Admitting: Podiatry

## 2019-10-25 ENCOUNTER — Encounter: Payer: Self-pay | Admitting: Podiatry

## 2019-10-25 DIAGNOSIS — M2041 Other hammer toe(s) (acquired), right foot: Secondary | ICD-10-CM

## 2019-10-25 DIAGNOSIS — L84 Corns and callosities: Secondary | ICD-10-CM

## 2019-10-25 DIAGNOSIS — M2042 Other hammer toe(s) (acquired), left foot: Secondary | ICD-10-CM

## 2019-10-25 DIAGNOSIS — M79674 Pain in right toe(s): Secondary | ICD-10-CM

## 2019-10-25 DIAGNOSIS — E119 Type 2 diabetes mellitus without complications: Secondary | ICD-10-CM

## 2019-10-25 DIAGNOSIS — E1151 Type 2 diabetes mellitus with diabetic peripheral angiopathy without gangrene: Secondary | ICD-10-CM | POA: Diagnosis not present

## 2019-10-25 DIAGNOSIS — B351 Tinea unguium: Secondary | ICD-10-CM | POA: Diagnosis not present

## 2019-10-25 DIAGNOSIS — M79675 Pain in left toe(s): Secondary | ICD-10-CM

## 2019-10-25 NOTE — Progress Notes (Signed)
Subjective: Mark L Forero Sr. presents today for preventative diabetic foot care, for diabetic foot evaluation and callus(es) plantar aspect b/l feet and painful mycotic toenails b/l that are difficult to trim. Pain interferes with ambulation. Aggravating factors include wearing enclosed shoe gear. Pain is relieved with periodic professional debridement.   He voices no new pedal problems on today's visit.  Past Medical History:  Diagnosis Date  . Arthritis    lower back  . Chronic back pain    Disc disease  . Chronic kidney disease   . Coronary atherosclerosis of native coronary artery    Coronary calcifications by chest CT, Myoview demonstrating inferolateral scar  . Deafness in right ear   . Diabetes mellitus   . Diabetic retinopathy ([place])    moderate nonproliferative with macular edema in right eye  . Diastolic dysfunction 01/2946   Grade 1.  . Essential hypertension, benign   . Heart murmur    in past  . HOH (hard of hearing)   . Hyperkalemia   . Kidney stones   . Mixed hyperlipidemia   . NAFLD (nonalcoholic fatty liver disease)   . Pancreatic insufficiency    Diagnosed at [facility]  . Prostate cancer ([place]) 2011  . Shortness of breath dyspnea    occasional - liver pressing on right lung, decreased capacity  . Subdural hematoma ([place])   . Type 2 diabetes mellitus ([place])   . Wears hearing aid    "crossover" form right to left    Patient Active Problem List   Diagnosis Date Noted  . Hyperkalemia 12/02/2018  . Fever, unknown origin 12/02/2018  . Altered mental state 12/02/2018  . Acute on chronic renal failure ([place]) 12/02/2018  . Pancreatic insufficiency 12/02/2018  . Wears hearing aid 12/02/2018  . HOH (hard of hearing) 12/02/2018  . DM type 2 with diabetic peripheral neuropathy ([place]) 09/15/2018  . Uncontrolled type 2 diabetes mellitus with hyperglycemia ([place]) 09/15/2018  . Peripheral edema 06/25/2018  . NAFLD (nonalcoholic fatty liver disease)   . Diabetic  retinopathy ([place])   . ED (erectile dysfunction) 01/05/2014  . Shortness of breath 12/05/2011  . Coronary atherosclerosis of native coronary artery 12/05/2011  . Type 2 diabetes mellitus ([place]) 12/05/2011  . Essential hypertension, benign 12/05/2011  . Mixed hyperlipidemia 12/05/2011  . Malignant neoplasm of prostate ([place]) 06/26/2011    Past Surgical History:  Procedure Laterality Date  . APPENDECTOMY    . Bilateral hip replacement    . CATARACT EXTRACTION W/PHACO Left 05/15/2015   Procedure: CATARACT EXTRACTION PHACO AND INTRAOCULAR LENS PLACEMENT (IOC);  Surgeon: Ronnell Freshwater, MD;  Location: [place] [facility];  Service: Ophthalmology;  Laterality: Left;  DIABETIC - insulin pump and oral meds  . CATARACT EXTRACTION W/PHACO Right 08/28/2015   Procedure: CATARACT EXTRACTION PHACO AND INTRAOCULAR LENS PLACEMENT (IOC);  Surgeon: Ronnell Freshwater, MD;  Location: [facility];  Service: Ophthalmology;  Laterality: Right;  DIABETIC PER PT AND CINDY PLEASE KEEP ARRIVAL TIME AFTER 8AM   . CHOLECYSTECTOMY    . HERNIA REPAIR    . PROSTATECTOMY  2011    Current Outpatient Medications on File Prior to Visit  Medication Sig Dispense Refill  . acetaminophen (TYLENOL) 325 MG tablet Take 2 tablets (650 mg total) by mouth every 6 (six) hours as needed for mild pain (or Fever >/= 101).    Marland Kitchen albuterol (PROVENTIL HFA;VENTOLIN HFA) 108 (90 Base) MCG/ACT inhaler Inhale 2 puffs into the lungs every 4 (four) hours as needed for wheezing or  shortness of breath. 1 Inhaler 0  . amLODipine (NORVASC) 10 MG tablet Take 1 tablet (10 mg total) by mouth daily. 90 tablet 3  . aspirin EC 81 MG tablet Take 81 mg by mouth at bedtime.     . carvedilol (COREG) 25 MG tablet TAKE 1 TABLET(25 MG) BY MOUTH TWICE DAILY WITH A MEAL 90 tablet 3  . doxazosin (CARDURA) 8 MG tablet TAKE 1 TABLET(8 MG) BY MOUTH DAILY 90 tablet 3  . furosemide (LASIX) 20 MG tablet Take 1.5 tablets (30 mg total) by mouth  daily. 135 tablet 3  . gabapentin (NEURONTIN) 300 MG capsule TAKE 2 CAPSULES(600 MG) BY MOUTH AT BEDTIME 180 capsule 3  . insulin glargine (LANTUS) 100 UNIT/ML injection Inject into the skin.    Marland Kitchen insulin NPH Human (HUMULIN N,NOVOLIN N) 100 UNIT/ML injection Inject 8-12 Units into the skin at bedtime. Take 12 units qam and 10 units qhs    . insulin regular (NOVOLIN R,HUMULIN R) 100 units/mL injection Inject 8 Units into the skin 3 (three) times daily before meals. 12u qam - 8u qhs    . ketoconazole (NIZORAL) 2 % cream     . metFORMIN (GLUCOPHAGE) 500 MG tablet Take 1,000 mg by mouth 2 (two) times daily with a meal.    . Multiple Vitamin (MULTIVITAMIN WITH MINERALS) TABS Take 1 tablet by mouth daily.    . Pancrelipase, Lip-Prot-Amyl, (ZENPEP) 10000-32000 units CPEP Take 1 capsule by mouth 3 (three) times daily before meals. 270 capsule 0  . pravastatin (PRAVACHOL) 10 MG tablet TAKE 1 TABLET(10 MG) BY MOUTH DAILY WITH BREAKFAST 90 tablet 3  . traZODone (DESYREL) 50 MG tablet Take 1 tablet (50 mg total) by mouth at bedtime as needed for sleep. 30 tablet 3  . valACYclovir (VALTREX) 1000 MG tablet Take 1 tablet (1,000 mg total) by mouth 3 (three) times daily. 21 tablet 0   No current facility-administered medications on file prior to visit.     Allergies  Allergen Reactions  . Enalapril Maleate Cough    Social History   Occupational History  . Not on file  Tobacco Use  . Smoking status: Never Smoker  . Smokeless tobacco: Never Used  Substance and Sexual Activity  . Alcohol use: No  . Drug use: No  . Sexual activity: Not on file    Family History  Problem Relation Age of Onset  . Diabetes type II Mother   . Heart attack Father     Immunization History  Administered Date(s) Administered  . Fluad Quad(high Dose 65+) 04/01/2019  . Influenza Split 04/21/2014  . Influenza,inj,Quad PF,6+ Mos 04/20/2013, 07/11/2015, 05/27/2016, 04/23/2018  . Influenza-Unspecified 04/20/2013,  07/11/2015, 05/27/2016  . PFIZER SARS-COV-2 Vaccination 08/30/2019, 09/20/2019  . Pneumococcal Conjugate-13 07/17/2015  . Pneumococcal Polysaccharide-23 09/06/2010  . Tdap 08/05/2010  . Zoster 08/08/2015     Objective: There were no vitals filed for this visit.  Mark L Maulden Sr. is a/an 79 y.o. male  IN NAD. AAO X 3.  Capillary refill time to digits immediate b/l. Nonpalpable DP pulses b/l. Nonpalpable PT pulses b/l. Pedal hair absent b/l Skin temperature gradient within normal limits b/l.  Pedal skin with normal turgor, texture and tone bilaterally. No open wounds bilaterally. No interdigital macerations bilaterally. Toenails 1-5 b/l elongated, dystrophic, thickened, crumbly with subungual debris and tenderness to dorsal palpation. Hyperkeratotic lesion(s) submetatarsal head 5 right foot.  No erythema, no edema, no drainage, no flocculence.  Normal muscle strength 5/5 to all lower extremity muscle  groups bilaterally, no pain crepitus or joint limitation noted with ROM b/l, hammertoes noted to the  L 5th toe and R 5th toe and utilizes cane for ambulation assistance  Protective sensation decreased with 10 gram monofilament b/l Vibratory sensation decreased b/l  Hemoglobin A1C Latest Ref Rng & Units 07/20/2019 12/08/2018 12/03/2018  HGBA1C <5.7 % of total Hgb 7.3(H) 7.8(H) 7.8(H)  Some recent data might be hidden    1. Pain due to onychomycosis of toenails of both feet   2. Callus   3. Acquired hammertoes of both feet   4. Type II diabetes mellitus with peripheral circulatory disorder (HCC)   5. Encounter for diabetic foot exam ([place])      -Diabetic foot examination performed on today's visit. -Continue diabetic foot care principles. Literature dispensed on today.  -Toenails 1-5 b/l were debrided in length and girth with sterile nail nippers and dremel without iatrogenic bleeding.  -Callus(es) submet head 5 right foot were debrided without complication or incident. Total number  debrided =1. -Patient to continue soft, supportive shoe gear daily. -Patient to report any pedal injuries to medical professional immediately. -Patient/POA to call should there be question/concern in the interim.  Return in about 3 months (around 01/25/2020) for diabetic nail trim.

## 2019-10-25 NOTE — Patient Instructions (Signed)
Diabetes Mellitus and Foot Care Foot care is an important part of your health, especially when you have diabetes. Diabetes may cause you to have problems because of poor blood flow (circulation) to your feet and legs, which can cause your skin to:  Become thinner and drier.  Break more easily.  Heal more slowly.  Peel and crack. You may also have nerve damage (neuropathy) in your legs and feet, causing decreased feeling in them. This means that you may not notice minor injuries to your feet that could lead to more serious problems. Noticing and addressing any potential problems early is the best way to prevent future foot problems. How to care for your feet Foot hygiene  Wash your feet daily with warm water and mild soap. Do not use hot water. Then, pat your feet and the areas between your toes until they are completely dry. Do not soak your feet as this can dry your skin.  Trim your toenails straight across. Do not dig under them or around the cuticle. File the edges of your nails with an emery board or nail file.  Apply a moisturizing lotion or petroleum jelly to the skin on your feet and to dry, brittle toenails. Use lotion that does not contain alcohol and is unscented. Do not apply lotion between your toes. Shoes and socks  Wear clean socks or stockings every day. Make sure they are not too tight. Do not wear knee-high stockings since they may decrease blood flow to your legs.  Wear shoes that fit properly and have enough cushioning. Always look in your shoes before you put them on to be sure there are no objects inside.  To break in new shoes, wear them for just a few hours a day. This prevents injuries on your feet. Wounds, scrapes, corns, and calluses  Check your feet daily for blisters, cuts, bruises, sores, and redness. If you cannot see the bottom of your feet, use a mirror or ask someone for help.  Do not cut corns or calluses or try to remove them with medicine.  If you  find a minor scrape, cut, or break in the skin on your feet, keep it and the skin around it clean and dry. You may clean these areas with mild soap and water. Do not clean the area with peroxide, alcohol, or iodine.  If you have a wound, scrape, corn, or callus on your foot, look at it several times a day to make sure it is healing and not infected. Check for: ? Redness, swelling, or pain. ? Fluid or blood. ? Warmth. ? Pus or a bad smell. General instructions  Do not cross your legs. This may decrease blood flow to your feet.  Do not use heating pads or hot water bottles on your feet. They may burn your skin. If you have lost feeling in your feet or legs, you may not know this is happening until it is too late.  Protect your feet from hot and cold by wearing shoes, such as at the beach or on hot pavement.  Schedule a complete foot exam at least once a year (annually) or more often if you have foot problems. If you have foot problems, report any cuts, sores, or bruises to your health care provider immediately. Contact a health care provider if:  You have a medical condition that increases your risk of infection and you have any cuts, sores, or bruises on your feet.  You have an injury that is not  healing.  You have redness on your legs or feet.  You feel burning or tingling in your legs or feet.  You have pain or cramps in your legs and feet.  Your legs or feet are numb.  Your feet always feel cold.  You have pain around a toenail. Get help right away if:  You have a wound, scrape, corn, or callus on your foot and: ? You have pain, swelling, or redness that gets worse. ? You have fluid or blood coming from the wound, scrape, corn, or callus. ? Your wound, scrape, corn, or callus feels warm to the touch. ? You have pus or a bad smell coming from the wound, scrape, corn, or callus. ? You have a fever. ? You have a red line going up your leg. Summary  Check your feet every day  for cuts, sores, red spots, swelling, and blisters.  Moisturize feet and legs daily.  Wear shoes that fit properly and have enough cushioning.  If you have foot problems, report any cuts, sores, or bruises to your health care provider immediately.  Schedule a complete foot exam at least once a year (annually) or more often if you have foot problems. This information is not intended to replace advice given to you by your health care provider. Make sure you discuss any questions you have with your health care provider. Document Revised: 04/14/2019 Document Reviewed: 08/23/2016 Elsevier Patient Education  [place].  Peripheral Neuropathy Peripheral neuropathy is a type of nerve damage. It affects nerves that carry signals between the spinal cord and the arms, legs, and the rest of the body (peripheral nerves). It does not affect nerves in the spinal cord or brain. In peripheral neuropathy, one nerve or a group of nerves may be damaged. Peripheral neuropathy is a broad category that includes many specific nerve disorders, like diabetic neuropathy, hereditary neuropathy, and carpal tunnel syndrome. What are the causes? This condition may be caused by:  Diabetes. This is the most common cause of peripheral neuropathy.  Nerve injury.  Pressure or stress on a nerve that lasts a long time.  Lack (deficiency) of B vitamins. This can result from alcoholism, poor diet, or a restricted diet.  Infections.  Autoimmune diseases, such as rheumatoid arthritis and systemic lupus erythematosus.  Nerve diseases that are passed from parent to child (inherited).  Some medicines, such as cancer medicines (chemotherapy).  Poisonous (toxic) substances, such as lead and mercury.  Too little blood flowing to the legs.  Kidney disease.  Thyroid disease. In some cases, the cause of this condition is not known. What are the signs or symptoms? Symptoms of this condition depend on which of your  nerves is damaged. Common symptoms include:  Loss of feeling (numbness) in the feet, hands, or both.  Tingling in the feet, hands, or both.  Burning pain.  Very sensitive skin.  Weakness.  Not being able to move a part of the body (paralysis).  Muscle twitching.  Clumsiness or poor coordination.  Loss of balance.  Not being able to control your bladder.  Feeling dizzy.  Sexual problems. How is this diagnosed? Diagnosing and finding the cause of peripheral neuropathy can be difficult. Your health care provider will take your medical history and do a physical exam. A neurological exam will also be done. This involves checking things that are affected by your brain, spinal cord, and nerves (nervous system). For example, your health care provider will check your reflexes, how you move, and  what you can feel. You may have other tests, such as:  Blood tests.  Electromyogram (EMG) and nerve conduction tests. These tests check nerve function and how well the nerves are controlling the muscles.  Imaging tests, such as CT scans or MRI to rule out other causes of your symptoms.  Removing a small piece of nerve to be examined in a lab (nerve biopsy). This is rare.  Removing and examining a small amount of the fluid that surrounds the brain and spinal cord (lumbar puncture). This is rare. How is this treated? Treatment for this condition may involve:  Treating the underlying cause of the neuropathy, such as diabetes, kidney disease, or vitamin deficiencies.  Stopping medicines that can cause neuropathy, such as chemotherapy.  Medicine to relieve pain. Medicines may include: ? Prescription or over-the-counter pain medicine. ? Antiseizure medicine. ? Antidepressants. ? Pain-relieving patches that are applied to painful areas of skin.  Surgery to relieve pressure on a nerve or to destroy a nerve that is causing pain.  Physical therapy to help improve movement and  balance.  Devices to help you move around (assistive devices). Follow these instructions at home: Medicines  Take over-the-counter and prescription medicines only as told by your health care provider. Do not take any other medicines without first asking your health care provider.  Do not drive or use heavy machinery while taking prescription pain medicine. Lifestyle   Do not use any products that contain nicotine or tobacco, such as cigarettes and e-cigarettes. Smoking keeps blood from reaching damaged nerves. If you need help quitting, ask your health care provider.  Avoid or limit alcohol. Too much alcohol can cause a vitamin B deficiency, and vitamin B is needed for healthy nerves.  Eat a healthy diet. This includes: ? Eating foods that are high in fiber, such as fresh fruits and vegetables, whole grains, and beans. ? Limiting foods that are high in fat and processed sugars, such as fried or sweet foods. General instructions   If you have diabetes, work closely with your health care provider to keep your blood sugar under control.  If you have numbness in your feet: ? Check every day for signs of injury or infection. Watch for redness, warmth, and swelling. ? Wear padded socks and comfortable shoes. These help protect your feet.  Develop a good support system. Living with peripheral neuropathy can be stressful. Consider talking with a mental health specialist or joining a support group.  Use assistive devices and attend physical therapy as told by your health care provider. This may include using a walker or a cane.  Keep all follow-up visits as told by your health care provider. This is important. Contact a health care provider if:  You have new signs or symptoms of peripheral neuropathy.  You are struggling emotionally from dealing with peripheral neuropathy.  Your pain is not well-controlled. Get help right away if:  You have an injury or infection that is not healing  normally.  You develop new weakness in an arm or leg.  You fall frequently. Summary  Peripheral neuropathy is when the nerves in the arms, or legs are damaged, resulting in numbness, weakness, or pain.  There are many causes of peripheral neuropathy, including diabetes, pinched nerves, vitamin deficiencies, autoimmune disease, and hereditary conditions.  Diagnosing and finding the cause of peripheral neuropathy can be difficult. Your health care provider will take your medical history, do a physical exam, and do tests, including blood tests and nerve function tests.  Treatment involves treating the underlying cause of the neuropathy and taking medicines to help control pain. Physical therapy and assistive devices may also help. This information is not intended to replace advice given to you by your health care provider. Make sure you discuss any questions you have with your health care provider. Document Revised: 07/04/2017 Document Reviewed: 09/30/2016 Elsevier Patient Education  2020 [employer].

## 2019-10-28 ENCOUNTER — Encounter: Payer: Self-pay | Admitting: Family Medicine

## 2019-11-01 DIAGNOSIS — E1122 Type 2 diabetes mellitus with diabetic chronic kidney disease: Secondary | ICD-10-CM | POA: Diagnosis not present

## 2019-11-01 DIAGNOSIS — N1832 Chronic kidney disease, stage 3b: Secondary | ICD-10-CM | POA: Diagnosis not present

## 2019-11-16 DIAGNOSIS — E113313 Type 2 diabetes mellitus with moderate nonproliferative diabetic retinopathy with macular edema, bilateral: Secondary | ICD-10-CM | POA: Diagnosis not present

## 2019-12-09 ENCOUNTER — Other Ambulatory Visit: Payer: Self-pay

## 2019-12-09 ENCOUNTER — Encounter: Payer: Self-pay | Admitting: Family Medicine

## 2019-12-09 ENCOUNTER — Ambulatory Visit (INDEPENDENT_AMBULATORY_CARE_PROVIDER_SITE_OTHER): Payer: PPO | Admitting: Family Medicine

## 2019-12-09 VITALS — BP 130/70 | HR 78 | Temp 97.2°F | Resp 20 | Ht 69.0 in | Wt 200.0 lb

## 2019-12-09 DIAGNOSIS — L89159 Pressure ulcer of sacral region, unspecified stage: Secondary | ICD-10-CM | POA: Diagnosis not present

## 2019-12-09 DIAGNOSIS — L03317 Cellulitis of buttock: Secondary | ICD-10-CM | POA: Diagnosis not present

## 2019-12-09 DIAGNOSIS — L0231 Cutaneous abscess of buttock: Secondary | ICD-10-CM

## 2019-12-09 MED ORDER — SULFAMETHOXAZOLE-TRIMETHOPRIM 800-160 MG PO TABS: 1.0000 | ORAL_TABLET | Freq: Two times a day (BID) | ORAL | 0 refills | Status: DC

## 2019-12-09 NOTE — Progress Notes (Signed)
Subjective:    Patient ID: Mark Hose., male    DOB: Nov 29, 1940, 79 y.o.   MRN: [id number]  HPI  Patient believes he has a pressure sore.  He reports a 1 month history of pain and tenderness over his sacrum whenever he is sitting.  He states that he sleeps in a recliner.  He also spends majority of his day sitting in a desk chair.  However he is still freely mobile and states that he is up throughout the day walking and doing chores around the house.  On examination, the skin over the superior gluteal cleft is erythematous and indurated.  Over the superior left gluteal cleft, the skin there is fibrotic and extremely tender and sore.  There is nonblanchable erythema.  There is some warmth.  There is no fluctuance.  There is no skin breakdown.  There is no drainage.  Patient has a stage I pressure sore over the superior left gluteal cleft with secondary fibrosis skin changes but without skin breakdown however there appears to be induration and a secondary cellulitis forming due to the warmth and tenderness and pain. Past Medical History:  Diagnosis Date  . Arthritis    lower back  . Chronic back pain    Disc disease  . Chronic kidney disease   . Coronary atherosclerosis of native coronary artery    Coronary calcifications by chest CT, Myoview demonstrating inferolateral scar  . Deafness in right ear   . Diabetes mellitus   . Diabetic retinopathy ([place])    moderate nonproliferative with macular edema in right eye  . Diastolic dysfunction 09/5636   Grade 1.  . Essential hypertension, benign   . Heart murmur    in past  . HOH (hard of hearing)   . Hyperkalemia   . Kidney stones   . Mixed hyperlipidemia   . NAFLD (nonalcoholic fatty liver disease)   . Pancreatic insufficiency    Diagnosed at [facility]  . Prostate cancer ([place]) 2011  . Shortness of breath dyspnea    occasional - liver pressing on right lung, decreased capacity  . Subdural hematoma ([place])   . Type 2 diabetes mellitus  ([place])   . Wears hearing aid    "crossover" form right to left   Past Surgical History:  Procedure Laterality Date  . APPENDECTOMY    . Bilateral hip replacement    . CATARACT EXTRACTION W/PHACO Left 05/15/2015   Procedure: CATARACT EXTRACTION PHACO AND INTRAOCULAR LENS PLACEMENT (IOC);  Surgeon: Ronnell Freshwater, MD;  Location: [facility];  Service: Ophthalmology;  Laterality: Left;  DIABETIC - insulin pump and oral meds  . CATARACT EXTRACTION W/PHACO Right 08/28/2015   Procedure: CATARACT EXTRACTION PHACO AND INTRAOCULAR LENS PLACEMENT (IOC);  Surgeon: Ronnell Freshwater, MD;  Location: [facility];  Service: Ophthalmology;  Laterality: Right;  DIABETIC PER PT AND CINDY PLEASE KEEP ARRIVAL TIME AFTER 8AM   . CHOLECYSTECTOMY    . HERNIA REPAIR    . PROSTATECTOMY  2011   Current Outpatient Medications on File Prior to Visit  Medication Sig Dispense Refill  . acetaminophen (TYLENOL) 325 MG tablet Take 2 tablets (650 mg total) by mouth every 6 (six) hours as needed for mild pain (or Fever >/= 101).    Marland Kitchen albuterol (PROVENTIL HFA;VENTOLIN HFA) 108 (90 Base) MCG/ACT inhaler Inhale 2 puffs into the lungs every 4 (four) hours as needed for wheezing or shortness of breath. 1 Inhaler 0  . amLODipine (NORVASC) 10 MG tablet  Take 1 tablet (10 mg total) by mouth daily. 90 tablet 3  . aspirin EC 81 MG tablet Take 81 mg by mouth at bedtime.     . carvedilol (COREG) 25 MG tablet TAKE 1 TABLET(25 MG) BY MOUTH TWICE DAILY WITH A MEAL 90 tablet 3  . doxazosin (CARDURA) 8 MG tablet TAKE 1 TABLET(8 MG) BY MOUTH DAILY 90 tablet 3  . furosemide (LASIX) 20 MG tablet Take 1.5 tablets (30 mg total) by mouth daily. 135 tablet 3  . gabapentin (NEURONTIN) 300 MG capsule TAKE 2 CAPSULES(600 MG) BY MOUTH AT BEDTIME 180 capsule 3  . insulin glargine (LANTUS) 100 UNIT/ML injection Inject into the skin.    Marland Kitchen insulin NPH Human (HUMULIN N,NOVOLIN N) 100 UNIT/ML injection Inject 8-12 Units into  the skin at bedtime. Take 12 units qam and 10 units qhs    . insulin regular (NOVOLIN R,HUMULIN R) 100 units/mL injection Inject 8 Units into the skin 3 (three) times daily before meals. 12u qam - 8u qhs    . ketoconazole (NIZORAL) 2 % cream     . metFORMIN (GLUCOPHAGE) 500 MG tablet Take 1,000 mg by mouth 2 (two) times daily with a meal.    . Multiple Vitamin (MULTIVITAMIN WITH MINERALS) TABS Take 1 tablet by mouth daily.    . Pancrelipase, Lip-Prot-Amyl, (ZENPEP) 10000-32000 units CPEP Take 1 capsule by mouth 3 (three) times daily before meals. 270 capsule 0  . pravastatin (PRAVACHOL) 10 MG tablet TAKE 1 TABLET(10 MG) BY MOUTH DAILY WITH BREAKFAST 90 tablet 3  . traZODone (DESYREL) 50 MG tablet Take 1 tablet (50 mg total) by mouth at bedtime as needed for sleep. 30 tablet 3  . valACYclovir (VALTREX) 1000 MG tablet Take 1 tablet (1,000 mg total) by mouth 3 (three) times daily. 21 tablet 0   No current facility-administered medications on file prior to visit.   Allergies  Allergen Reactions  . Enalapril Maleate Cough   Social History   Socioeconomic History  . Marital status: Married    Spouse name: Not on file  . Number of children: Not on file  . Years of education: Not on file  . Highest education level: Not on file  Occupational History  . Not on file  Tobacco Use  . Smoking status: Never Smoker  . Smokeless tobacco: Never Used  Substance and Sexual Activity  . Alcohol use: No  . Drug use: No  . Sexual activity: Not on file  Other Topics Concern  . Not on file  Social History Narrative  . Not on file   Social Determinants of Health   Financial Resource Strain:   . Difficulty of Paying Living Expenses:   Food Insecurity:   . Worried About Charity fundraiser in the Last Year:   . Arboriculturist in the Last Year:   Transportation Needs:   . Film/video editor (Medical):   Marland Kitchen Lack of Transportation (Non-Medical):   Physical Activity:   . Days of Exercise per  Week:   . Minutes of Exercise per Session:   Stress:   . Feeling of Stress :   Social Connections:   . Frequency of Communication with Friends and Family:   . Frequency of Social Gatherings with Friends and Family:   . Attends Religious Services:   . Active Member of Clubs or Organizations:   . Attends Archivist Meetings:   Marland Kitchen Marital Status:   Intimate Partner Violence:   . Fear of  Current or Ex-Partner:   . Emotionally Abused:   Marland Kitchen Physically Abused:   . Sexually Abused:       Review of Systems  All other systems reviewed and are negative.      Objective:   Physical Exam  Constitutional: He is oriented to person, place, and time. He appears well-developed and well-nourished.  Neck: No JVD present.  Cardiovascular: Normal rate, regular rhythm, normal heart sounds and intact distal pulses.  No murmur heard. Pulmonary/Chest: Effort normal. He has no decreased breath sounds. He has no wheezes. He has no rhonchi. He has rales in the right middle field.  Musculoskeletal:     Cervical back: Neck supple.       Back:  Lymphadenopathy:    He has no cervical adenopathy.  Neurological: He is alert and oriented to person, place, and time. He has normal reflexes. No cranial nerve deficit. Coordination normal.  Skin: There is erythema.  Psychiatric: He has a normal mood and affect. His speech is normal and behavior is normal. Judgment normal. Cognition and memory are normal.  Vitals reviewed.         Assessment & Plan:  Decubitus ulcer of coccyx, unspecified pressure ulcer stage  Cellulitis and abscess of buttock  Recommended the patient offload the pressure from that area is much as possible.  Specifically I do not want him sleeping in the recliner.  I feel that the downward traction against the skin due to sleeping in a sitting position is likely irritating the skin leading to the stage I pressure sore.  I am concerned due to the induration and redness that he may  be developing a secondary cellulitis and therefore I will start Bactrim double strength tablets twice daily to cover for possible gram-negative bacteria as well as staph.  Recheck in 1 week.

## 2019-12-16 ENCOUNTER — Other Ambulatory Visit: Payer: Self-pay

## 2019-12-16 ENCOUNTER — Ambulatory Visit (INDEPENDENT_AMBULATORY_CARE_PROVIDER_SITE_OTHER): Payer: PPO | Admitting: Family Medicine

## 2019-12-16 ENCOUNTER — Encounter: Payer: Self-pay | Admitting: Family Medicine

## 2019-12-16 VITALS — BP 120/60 | HR 78 | Temp 98.1°F | Resp 18 | Ht 69.0 in | Wt 200.0 lb

## 2019-12-16 DIAGNOSIS — L89159 Pressure ulcer of sacral region, unspecified stage: Secondary | ICD-10-CM | POA: Diagnosis not present

## 2019-12-16 NOTE — Progress Notes (Signed)
Subjective:    Patient ID: Mark Hose., male    DOB: 03-05-1941, 79 y.o.   MRN: [id number]  HPI  12/09/19 Patient believes he has a pressure sore.  He reports a 1 month history of pain and tenderness over his sacrum whenever he is sitting.  He states that he sleeps in a recliner.  He also spends majority of his day sitting in a desk chair.  However he is still freely mobile and states that he is up throughout the day walking and doing chores around the house.  On examination, the skin over the superior gluteal cleft is erythematous and indurated.  Over the superior left gluteal cleft, the skin there is fibrotic and extremely tender and sore.  There is nonblanchable erythema.  There is some warmth.  There is no fluctuance.  There is no skin breakdown.  There is no drainage.  Patient has a stage I pressure sore over the superior left gluteal cleft with secondary fibrosis skin changes but without skin breakdown however there appears to be induration and a secondary cellulitis forming due to the warmth and tenderness and pain.  At that time, my plan was: Recommended the patient offload the pressure from that area is much as possible.  Specifically I do not want him sleeping in the recliner.  I feel that the downward traction against the skin due to sleeping in a sitting position is likely irritating the skin leading to the stage I pressure sore.  I am concerned due to the induration and redness that he may be developing a secondary cellulitis and therefore I will start Bactrim double strength tablets twice daily to cover for possible gram-negative bacteria as well as staph.  Recheck in 1 week.  12/16/19 The pain has subsided.  The erythema has improved.  He still has a patch of nonblanchable erythema over the superior gluteal cleft as well as fibrotic firm skin to the left of the gluteal cleft that is approximately 1 cm in diameter.  This is no longer tender.  The secondary cellulitis appears to have  improved.  However the stage I pressure sore remains Past Medical History:  Diagnosis Date  . Arthritis    lower back  . Chronic back pain    Disc disease  . Chronic kidney disease   . Coronary atherosclerosis of native coronary artery    Coronary calcifications by chest CT, Myoview demonstrating inferolateral scar  . Deafness in right ear   . Diabetes mellitus   . Diabetic retinopathy ([place])    moderate nonproliferative with macular edema in right eye  . Diastolic dysfunction 03/997   Grade 1.  . Essential hypertension, benign   . Heart murmur    in past  . HOH (hard of hearing)   . Hyperkalemia   . Kidney stones   . Mixed hyperlipidemia   . NAFLD (nonalcoholic fatty liver disease)   . Pancreatic insufficiency    Diagnosed at [facility]  . Prostate cancer ([place]) 2011  . Shortness of breath dyspnea    occasional - liver pressing on right lung, decreased capacity  . Subdural hematoma ([place])   . Type 2 diabetes mellitus ([place])   . Wears hearing aid    "crossover" form right to left   Past Surgical History:  Procedure Laterality Date  . APPENDECTOMY    . Bilateral hip replacement    . CATARACT EXTRACTION W/PHACO Left 05/15/2015   Procedure: CATARACT EXTRACTION PHACO AND INTRAOCULAR LENS PLACEMENT (IOC);  Surgeon: Ronnell Freshwater, MD;  Location: [place];  Service: Ophthalmology;  Laterality: Left;  DIABETIC - insulin pump and oral meds  . CATARACT EXTRACTION W/PHACO Right 08/28/2015   Procedure: CATARACT EXTRACTION PHACO AND INTRAOCULAR LENS PLACEMENT (IOC);  Surgeon: Ronnell Freshwater, MD;  Location: [place];  Service: Ophthalmology;  Laterality: Right;  DIABETIC PER PT AND CINDY PLEASE KEEP ARRIVAL TIME AFTER 8AM   . CHOLECYSTECTOMY    . HERNIA REPAIR    . PROSTATECTOMY  2011   Current Outpatient Medications on File Prior to Visit  Medication Sig Dispense Refill  . acetaminophen (TYLENOL) 325 MG tablet Take 2 tablets (650 mg total) by  mouth every 6 (six) hours as needed for mild pain (or Fever >/= 101).    Marland Kitchen albuterol (PROVENTIL HFA;VENTOLIN HFA) 108 (90 Base) MCG/ACT inhaler Inhale 2 puffs into the lungs every 4 (four) hours as needed for wheezing or shortness of breath. 1 Inhaler 0  . amLODipine (NORVASC) 10 MG tablet Take 1 tablet (10 mg total) by mouth daily. 90 tablet 3  . aspirin EC 81 MG tablet Take 81 mg by mouth at bedtime.     . carvedilol (COREG) 25 MG tablet TAKE 1 TABLET(25 MG) BY MOUTH TWICE DAILY WITH A MEAL 90 tablet 3  . doxazosin (CARDURA) 8 MG tablet TAKE 1 TABLET(8 MG) BY MOUTH DAILY 90 tablet 3  . furosemide (LASIX) 20 MG tablet Take 1.5 tablets (30 mg total) by mouth daily. 135 tablet 3  . gabapentin (NEURONTIN) 300 MG capsule TAKE 2 CAPSULES(600 MG) BY MOUTH AT BEDTIME 180 capsule 3  . insulin glargine (LANTUS) 100 UNIT/ML injection Inject into the skin.    Marland Kitchen insulin NPH Human (HUMULIN N,NOVOLIN N) 100 UNIT/ML injection Inject 8-12 Units into the skin at bedtime. Take 12 units qam and 10 units qhs    . insulin regular (NOVOLIN R,HUMULIN R) 100 units/mL injection Inject 8 Units into the skin 3 (three) times daily before meals. 12u qam - 8u qhs    . ketoconazole (NIZORAL) 2 % cream     . metFORMIN (GLUCOPHAGE) 500 MG tablet Take 1,000 mg by mouth 2 (two) times daily with a meal.    . Multiple Vitamin (MULTIVITAMIN WITH MINERALS) TABS Take 1 tablet by mouth daily.    . Pancrelipase, Lip-Prot-Amyl, (ZENPEP) 10000-32000 units CPEP Take 1 capsule by mouth 3 (three) times daily before meals. 270 capsule 0  . pravastatin (PRAVACHOL) 10 MG tablet TAKE 1 TABLET(10 MG) BY MOUTH DAILY WITH BREAKFAST 90 tablet 3  . sulfamethoxazole-trimethoprim (BACTRIM DS) 800-160 MG tablet Take 1 tablet by mouth 2 (two) times daily. 14 tablet 0  . traZODone (DESYREL) 50 MG tablet Take 1 tablet (50 mg total) by mouth at bedtime as needed for sleep. 30 tablet 3  . valACYclovir (VALTREX) 1000 MG tablet Take 1 tablet (1,000 mg total) by  mouth 3 (three) times daily. 21 tablet 0   No current facility-administered medications on file prior to visit.   Allergies  Allergen Reactions  . Enalapril Maleate Cough   Social History   Socioeconomic History  . Marital status: Married    Spouse name: Not on file  . Number of children: Not on file  . Years of education: Not on file  . Highest education level: Not on file  Occupational History  . Not on file  Tobacco Use  . Smoking status: Never Smoker  . Smokeless tobacco: Never Used  Substance and Sexual Activity  .  Alcohol use: No  . Drug use: No  . Sexual activity: Not on file  Other Topics Concern  . Not on file  Social History Narrative  . Not on file   Social Determinants of Health   Financial Resource Strain:   . Difficulty of Paying Living Expenses:   Food Insecurity:   . Worried About Charity fundraiser in the Last Year:   . Arboriculturist in the Last Year:   Transportation Needs:   . Film/video editor (Medical):   Marland Kitchen Lack of Transportation (Non-Medical):   Physical Activity:   . Days of Exercise per Week:   . Minutes of Exercise per Session:   Stress:   . Feeling of Stress :   Social Connections:   . Frequency of Communication with Friends and Family:   . Frequency of Social Gatherings with Friends and Family:   . Attends Religious Services:   . Active Member of Clubs or Organizations:   . Attends Archivist Meetings:   Marland Kitchen Marital Status:   Intimate Partner Violence:   . Fear of Current or Ex-Partner:   . Emotionally Abused:   Marland Kitchen Physically Abused:   . Sexually Abused:       Review of Systems  All other systems reviewed and are negative.      Objective:   Physical Exam  Constitutional: He is oriented to person, place, and time. He appears well-developed and well-nourished.  Neck: No JVD present.  Cardiovascular: Normal rate, regular rhythm, normal heart sounds and intact distal pulses.  No murmur  heard. Pulmonary/Chest: Effort normal. He has no decreased breath sounds. He has no wheezes. He has no rhonchi. He has rales in the right middle field.  Musculoskeletal:     Cervical back: Neck supple.       Back:  Lymphadenopathy:    He has no cervical adenopathy.  Neurological: He is alert and oriented to person, place, and time. He has normal reflexes. No cranial nerve deficit. Coordination normal.  Skin: There is erythema.  Psychiatric: He has a normal mood and affect. His speech is normal and behavior is normal. Judgment normal. Cognition and memory are normal.  Vitals reviewed.         Assessment & Plan:  Decubitus ulcer of coccyx, unspecified pressure ulcer stage  There is no skin breakdown.  There is only nonblanchable erythema consistent with a stage I pressure sore and fibrosis of the skin due to the chronicity of the issue.  I have recommended lifestyle changes to offload pressure from this such as using cushions and chairs and trying to change positions every 2 hours if possible.  Reassess if no better over the next 3 to 4 weeks or sooner if worsening.

## 2019-12-23 ENCOUNTER — Other Ambulatory Visit: Payer: Self-pay | Admitting: Family Medicine

## 2019-12-23 ENCOUNTER — Encounter: Payer: Self-pay | Admitting: Family Medicine

## 2019-12-23 ENCOUNTER — Other Ambulatory Visit: Payer: Self-pay

## 2019-12-23 ENCOUNTER — Ambulatory Visit (INDEPENDENT_AMBULATORY_CARE_PROVIDER_SITE_OTHER): Payer: PPO | Admitting: Family Medicine

## 2019-12-23 VITALS — BP 110/60 | HR 78 | Temp 97.1°F | Resp 16 | Ht 69.0 in | Wt 199.0 lb

## 2019-12-23 DIAGNOSIS — R4182 Altered mental status, unspecified: Secondary | ICD-10-CM

## 2019-12-23 NOTE — Progress Notes (Signed)
Subjective:    Patient ID: Mark Hose., male    DOB: 02-Aug-1941, 79 y.o.   MRN: [id number]  HPI  09/2019 Dating as far back as 2015, the patient has had episodes where he has become extremely confused and disoriented.  Patient has mild memory impairment.  At times, I am concerned that he may be accidentally taking his medication incorrectly.  He is supposed to be using oxycodone sparingly for back pain.  He is supposed to be using Valium sparingly for anxiety.  Normally he takes 2 or 3 Valium a week and perhaps 1 oxycodone a day.  However he also is using gabapentin 600 mg at night for neuropathy.  He also occasionally independently uses Benadryl to help him sleep.  I am concerned about polypharmacy potentially adding to this.  Wife states that over the last 2 weeks he has become more lethargic and more confused.  His son was found dead at home possibly due to a drug overdose.  There is stress in dealing with his daughter-in-law over the estate.  He just buried his sister yesterday.  His wife thought that he was grieving however he is now very sleepy and wanting to sleep all day.  Here today he is slurring his words slightly.  He seems extremely sleepy.  Several times during our conversation he closes his eyes only to have them startled open when I speak to him.  Urinalysis is completely normal and shows no evidence of urinary tract infection.  He has his persistent right basilar crackles however this is chronic and unchanged.  He denies any cough or chest pain or fever.  Neurologic exam today aside from his slightly lethargic state is normal.  Cranial nerves II through XII are grossly intact with muscle strength 5/5 equal and symmetric in the upper and lower extremities.  There is no asterixis on exam.  There is no pronator drift.  There is no evidence of stroke.  The patient does not appear dehydrated.  However he is administering his own medication.  This is so because his wife has been staying in  another home as we were concerned that she may have Covid.  Therefore the patient has been taking his other medications.  Usually she supervises him.  At that time, my plan was: Given his nonalcoholic fatty liver disease I will check an ammonia level.  There is no asterixis on exam today.  However if his ammonia level is elevated obviously we will start him on lactulose.  Urinalysis shows no urinary tract infection and pulmonary exam does not suggest pneumonia.  Neurologic exam does not suggest a stroke.  Therefore my biggest concern is dehydration and polypharmacy and medication unintentional overuse.  I have recommended that his wife supervise all of his medication and that she immediately stop oxycodone, Valium, gabapentin, and Benadryl.  Recheck on Monday.  I anticipate that he will be more awake and alert by Monday.  Spent more than 25 minutes today with the patient exam performing his history  12/23/19 Wt Readings from Last 3 Encounters:  12/23/19 199 lb (90.3 kg)  12/16/19 200 lb (90.7 kg)  12/09/19 200 lb (90.7 kg)   Patient's wife called concerned because the patient was more confused.  She states that he was occasionally drooling for no reason.  He was losing his balance and seemed more unsteady on his feet.  I recommended that they come in immediately for evaluation.  Patient admits that he has been taking 1  gabapentin every day for the last 2 weeks.  The last 2 nights he is started using Benadryl again to help him sleep.  Here today he also admits to using Valium.  Therefore he is on Valium, gabapentin, and Benadryl.  This combination is what triggered a similar reaction a few months ago.  His wife was unaware that he was using these 3 medications.  He denies any fevers or chills or cough or shortness of breath.  He denies any abdominal pain or diarrhea or vomiting or dysuria.  Cranial nerves II through XII are grossly intact today muscle strength is 5/5 equal and symmetric in the upper and lower  extremities.  However he is slurring his speech.  There is no facial droop.  There is no neurologic deficit.  The slurring is more consistent with someone who is overmedicated.  He also seems unsteady on his feet however I do not believe that this is cerebellar ataxia but is rather due to combination of Valium and gabapentin. Past Medical History:  Diagnosis Date  . Arthritis    lower back  . Chronic back pain    Disc disease  . Chronic kidney disease   . Coronary atherosclerosis of native coronary artery    Coronary calcifications by chest CT, Myoview demonstrating inferolateral scar  . Deafness in right ear   . Diabetes mellitus   . Diabetic retinopathy ([place])    moderate nonproliferative with macular edema in right eye  . Diastolic dysfunction 12/4648   Grade 1.  . Essential hypertension, benign   . Heart murmur    in past  . HOH (hard of hearing)   . Hyperkalemia   . Kidney stones   . Mixed hyperlipidemia   . NAFLD (nonalcoholic fatty liver disease)   . Pancreatic insufficiency    Diagnosed at [facility]  . Prostate cancer ([place]) 2011  . Shortness of breath dyspnea    occasional - liver pressing on right lung, decreased capacity  . Subdural hematoma ([place])   . Type 2 diabetes mellitus ([place])   . Wears hearing aid    "crossover" form right to left   Past Surgical History:  Procedure Laterality Date  . APPENDECTOMY    . Bilateral hip replacement    . CATARACT EXTRACTION W/PHACO Left 05/15/2015   Procedure: CATARACT EXTRACTION PHACO AND INTRAOCULAR LENS PLACEMENT (IOC);  Surgeon: Ronnell Freshwater, MD;  Location: [place];  Service: Ophthalmology;  Laterality: Left;  DIABETIC - insulin pump and oral meds  . CATARACT EXTRACTION W/PHACO Right 08/28/2015   Procedure: CATARACT EXTRACTION PHACO AND INTRAOCULAR LENS PLACEMENT (IOC);  Surgeon: Ronnell Freshwater, MD;  Location: [place];  Service: Ophthalmology;  Laterality: Right;  DIABETIC PER PT AND  CINDY PLEASE KEEP ARRIVAL TIME AFTER 8AM   . CHOLECYSTECTOMY    . HERNIA REPAIR    . PROSTATECTOMY  2011   Current Outpatient Medications on File Prior to Visit  Medication Sig Dispense Refill  . acetaminophen (TYLENOL) 325 MG tablet Take 2 tablets (650 mg total) by mouth every 6 (six) hours as needed for mild pain (or Fever >/= 101).    Marland Kitchen albuterol (PROVENTIL HFA;VENTOLIN HFA) 108 (90 Base) MCG/ACT inhaler Inhale 2 puffs into the lungs every 4 (four) hours as needed for wheezing or shortness of breath. 1 Inhaler 0  . amLODipine (NORVASC) 10 MG tablet Take 1 tablet (10 mg total) by mouth daily. 90 tablet 3  . aspirin EC 81 MG tablet Take 81 mg  by mouth at bedtime.     . carvedilol (COREG) 25 MG tablet TAKE 1 TABLET(25 MG) BY MOUTH TWICE DAILY WITH A MEAL 90 tablet 3  . doxazosin (CARDURA) 8 MG tablet TAKE 1 TABLET(8 MG) BY MOUTH DAILY 90 tablet 3  . furosemide (LASIX) 20 MG tablet Take 1.5 tablets (30 mg total) by mouth daily. 135 tablet 3  . gabapentin (NEURONTIN) 300 MG capsule TAKE 2 CAPSULES(600 MG) BY MOUTH AT BEDTIME 180 capsule 3  . insulin glargine (LANTUS) 100 UNIT/ML injection Inject into the skin.    Marland Kitchen insulin NPH Human (HUMULIN N,NOVOLIN N) 100 UNIT/ML injection Inject 8-12 Units into the skin at bedtime. Take 12 units qam and 10 units qhs    . insulin regular (NOVOLIN R,HUMULIN R) 100 units/mL injection Inject 8 Units into the skin 3 (three) times daily before meals. 12u qam - 8u qhs    . ketoconazole (NIZORAL) 2 % cream     . metFORMIN (GLUCOPHAGE) 500 MG tablet Take 1,000 mg by mouth 2 (two) times daily with a meal.    . Multiple Vitamin (MULTIVITAMIN WITH MINERALS) TABS Take 1 tablet by mouth daily.    . Pancrelipase, Lip-Prot-Amyl, (ZENPEP) 10000-32000 units CPEP Take 1 capsule by mouth 3 (three) times daily before meals. 270 capsule 0  . pravastatin (PRAVACHOL) 10 MG tablet TAKE 1 TABLET(10 MG) BY MOUTH DAILY WITH BREAKFAST 90 tablet 3  . traZODone (DESYREL) 50 MG tablet Take  1 tablet (50 mg total) by mouth at bedtime as needed for sleep. 30 tablet 3  . valACYclovir (VALTREX) 1000 MG tablet Take 1 tablet (1,000 mg total) by mouth 3 (three) times daily. 21 tablet 0   No current facility-administered medications on file prior to visit.   Allergies  Allergen Reactions  . Enalapril Maleate Cough   Social History   Socioeconomic History  . Marital status: Married    Spouse name: Not on file  . Number of children: Not on file  . Years of education: Not on file  . Highest education level: Not on file  Occupational History  . Not on file  Tobacco Use  . Smoking status: Never Smoker  . Smokeless tobacco: Never Used  Substance and Sexual Activity  . Alcohol use: No  . Drug use: No  . Sexual activity: Not on file  Other Topics Concern  . Not on file  Social History Narrative  . Not on file   Social Determinants of Health   Financial Resource Strain:   . Difficulty of Paying Living Expenses:   Food Insecurity:   . Worried About Charity fundraiser in the Last Year:   . Arboriculturist in the Last Year:   Transportation Needs:   . Film/video editor (Medical):   Marland Kitchen Lack of Transportation (Non-Medical):   Physical Activity:   . Days of Exercise per Week:   . Minutes of Exercise per Session:   Stress:   . Feeling of Stress :   Social Connections:   . Frequency of Communication with Friends and Family:   . Frequency of Social Gatherings with Friends and Family:   . Attends Religious Services:   . Active Member of Clubs or Organizations:   . Attends Archivist Meetings:   Marland Kitchen Marital Status:   Intimate Partner Violence:   . Fear of Current or Ex-Partner:   . Emotionally Abused:   Marland Kitchen Physically Abused:   . Sexually Abused:  Review of Systems  All other systems reviewed and are negative.      Objective:   Physical Exam  Constitutional: He is oriented to person, place, and time. He appears well-developed and well-nourished.   Neck: No JVD present.  Cardiovascular: Normal rate, regular rhythm, normal heart sounds and intact distal pulses.  No murmur heard. Pulmonary/Chest: Effort normal. He has no decreased breath sounds. He has no wheezes. He has no rhonchi. He has rales in the right middle field.  Abdominal: Soft. Bowel sounds are normal. He exhibits no distension and no mass. There is no abdominal tenderness. There is no rebound and no guarding.  Musculoskeletal:     Cervical back: Neck supple.  Lymphadenopathy:    He has no cervical adenopathy.  Neurological: He is oriented to person, place, and time. He has normal reflexes. No cranial nerve deficit. He exhibits normal muscle tone. Coordination normal.  Psychiatric: He has a normal mood and affect. Judgment and thought content normal. His speech is slurred. He is slowed. Cognition and memory are normal.  Vitals reviewed.         Assessment & Plan:  Altered mental status, unspecified altered mental status type - Plan: CBC with Differential/Platelet, COMPLETE METABOLIC PANEL WITH GFR, Ammonia  Patient has polypharmacy likely causing altered mental status.  He has had this similarly in the past.  Patient's wife is unaware that he was using Benadryl and Valium unbeknownst to her.  He was supposed to only use the gabapentin at night to help him sleep.  Therefore I recommended that he stop all of these medications.  I anticipate that his mental status and level of alertness will improve rapidly over the next 48 hours.  I will check a CBC to evaluate for any leukocytosis and I will also check an ammonia level however he has no asterixis today on exam.  I cautioned the patient and his wife not to use Benadryl, gabapentin, and Valium.  I believe the patient is falling back into his habit of polypharmacy.  I initially gave him Valium only after the death of his son however I believe enough time is passed now with medication presents at greater risk than benefit.  I  strongly recommended against Benadryl.  Patient's wife is encouraged again to supervise his medication as I do believe the patient has mild underlying vascular dementia it is only exacerbated due to polypharmacy.

## 2019-12-24 LAB — CBC WITH DIFFERENTIAL/PLATELET
Absolute Monocytes: 421 cells/uL (ref 200–950)
Basophils Absolute: 31 cells/uL (ref 0–200)
Basophils Relative: 0.5 %
Eosinophils Absolute: 189 cells/uL (ref 15–500)
Eosinophils Relative: 3.1 %
HCT: 34.1 % — ABNORMAL LOW (ref 38.5–50.0)
Hemoglobin: 10.7 g/dL — ABNORMAL LOW (ref 13.2–17.1)
Lymphs Abs: 1226 cells/uL (ref 850–3900)
MCH: 30.6 pg (ref 27.0–33.0)
MCHC: 31.4 g/dL — ABNORMAL LOW (ref 32.0–36.0)
MCV: 97.4 fL (ref 80.0–100.0)
MPV: 9.9 fL (ref 7.5–12.5)
Monocytes Relative: 6.9 %
Neutro Abs: 4233 cells/uL (ref 1500–7800)
Neutrophils Relative %: 69.4 %
Platelets: 179 10*3/uL (ref 140–400)
RBC: 3.5 10*6/uL — ABNORMAL LOW (ref 4.20–5.80)
RDW: 12.5 % (ref 11.0–15.0)
Total Lymphocyte: 20.1 %
WBC: 6.1 10*3/uL (ref 3.8–10.8)

## 2019-12-24 LAB — AMMONIA: Ammonia: 50 umol/L (ref <=72)

## 2019-12-24 LAB — COMPLETE METABOLIC PANEL WITH GFR
AG Ratio: 1.3 (calc) (ref 1.0–2.5)
ALT: 29 U/L (ref 9–46)
AST: 26 U/L (ref 10–35)
Albumin: 3.3 g/dL — ABNORMAL LOW (ref 3.6–5.1)
Alkaline phosphatase (APISO): 171 U/L — ABNORMAL HIGH (ref 35–144)
BUN/Creatinine Ratio: 18 (calc) (ref 6–22)
BUN: 41 mg/dL — ABNORMAL HIGH (ref 7–25)
CO2: 17 mmol/L — ABNORMAL LOW (ref 20–32)
Calcium: 8.1 mg/dL — ABNORMAL LOW (ref 8.6–10.3)
Chloride: 113 mmol/L — ABNORMAL HIGH (ref 98–110)
Creat: 2.31 mg/dL — ABNORMAL HIGH (ref 0.70–1.18)
GFR, Est African American: 30 mL/min/{1.73_m2} — ABNORMAL LOW (ref >=60)
GFR, Est Non African American: 26 mL/min/{1.73_m2} — ABNORMAL LOW (ref >=60)
Globulin: 2.6 g/dL (calc) (ref 1.9–3.7)
Glucose, Bld: 259 mg/dL — ABNORMAL HIGH (ref 65–99)
Potassium: 4.9 mmol/L (ref 3.5–5.3)
Sodium: 139 mmol/L (ref 135–146)
Total Bilirubin: 0.3 mg/dL (ref 0.2–1.2)
Total Protein: 5.9 g/dL — ABNORMAL LOW (ref 6.1–8.1)

## 2019-12-25 ENCOUNTER — Other Ambulatory Visit: Payer: Self-pay | Admitting: Family Medicine

## 2019-12-27 ENCOUNTER — Other Ambulatory Visit: Payer: Self-pay | Admitting: *Deleted

## 2019-12-27 DIAGNOSIS — E860 Dehydration: Secondary | ICD-10-CM

## 2019-12-28 DIAGNOSIS — E113313 Type 2 diabetes mellitus with moderate nonproliferative diabetic retinopathy with macular edema, bilateral: Secondary | ICD-10-CM | POA: Diagnosis not present

## 2019-12-30 ENCOUNTER — Other Ambulatory Visit: Payer: Self-pay

## 2019-12-30 ENCOUNTER — Other Ambulatory Visit: Payer: PPO

## 2019-12-30 DIAGNOSIS — E860 Dehydration: Secondary | ICD-10-CM | POA: Diagnosis not present

## 2019-12-30 LAB — COMPLETE METABOLIC PANEL WITH GFR
AG Ratio: 1.5 (calc) (ref 1.0–2.5)
ALT: 30 U/L (ref 9–46)
AST: 26 U/L (ref 10–35)
Albumin: 3.5 g/dL — ABNORMAL LOW (ref 3.6–5.1)
Alkaline phosphatase (APISO): 144 U/L (ref 35–144)
BUN/Creatinine Ratio: 19 (calc) (ref 6–22)
BUN: 33 mg/dL — ABNORMAL HIGH (ref 7–25)
CO2: 22 mmol/L (ref 20–32)
Calcium: 8.2 mg/dL — ABNORMAL LOW (ref 8.6–10.3)
Chloride: 108 mmol/L (ref 98–110)
Creat: 1.77 mg/dL — ABNORMAL HIGH (ref 0.70–1.18)
GFR, Est African American: 42 mL/min/{1.73_m2} — ABNORMAL LOW (ref >=60)
GFR, Est Non African American: 36 mL/min/{1.73_m2} — ABNORMAL LOW (ref >=60)
Globulin: 2.3 g/dL (calc) (ref 1.9–3.7)
Glucose, Bld: 195 mg/dL — ABNORMAL HIGH (ref 65–99)
Potassium: 4.4 mmol/L (ref 3.5–5.3)
Sodium: 137 mmol/L (ref 135–146)
Total Bilirubin: 0.4 mg/dL (ref 0.2–1.2)
Total Protein: 5.8 g/dL — ABNORMAL LOW (ref 6.1–8.1)

## 2020-01-07 ENCOUNTER — Other Ambulatory Visit: Payer: Self-pay

## 2020-01-07 ENCOUNTER — Ambulatory Visit (INDEPENDENT_AMBULATORY_CARE_PROVIDER_SITE_OTHER): Payer: PPO | Admitting: Family Medicine

## 2020-01-07 VITALS — BP 130/74 | HR 81 | Temp 98.5°F | Wt 198.0 lb

## 2020-01-07 DIAGNOSIS — F5101 Primary insomnia: Secondary | ICD-10-CM

## 2020-01-07 DIAGNOSIS — K8689 Other specified diseases of pancreas: Secondary | ICD-10-CM | POA: Diagnosis not present

## 2020-01-07 DIAGNOSIS — Z0001 Encounter for general adult medical examination with abnormal findings: Secondary | ICD-10-CM | POA: Diagnosis not present

## 2020-01-07 DIAGNOSIS — H833X1 Noise effects on right inner ear: Secondary | ICD-10-CM

## 2020-01-07 DIAGNOSIS — K7581 Nonalcoholic steatohepatitis (NASH): Secondary | ICD-10-CM

## 2020-01-07 DIAGNOSIS — Z8679 Personal history of other diseases of the circulatory system: Secondary | ICD-10-CM

## 2020-01-07 DIAGNOSIS — N1832 Chronic kidney disease, stage 3b: Secondary | ICD-10-CM | POA: Diagnosis not present

## 2020-01-07 DIAGNOSIS — E1142 Type 2 diabetes mellitus with diabetic polyneuropathy: Secondary | ICD-10-CM

## 2020-01-07 DIAGNOSIS — I10 Essential (primary) hypertension: Secondary | ICD-10-CM

## 2020-01-07 DIAGNOSIS — R413 Other amnesia: Secondary | ICD-10-CM

## 2020-01-07 DIAGNOSIS — E11649 Type 2 diabetes mellitus with hypoglycemia without coma: Secondary | ICD-10-CM | POA: Diagnosis not present

## 2020-01-07 DIAGNOSIS — Z8546 Personal history of malignant neoplasm of prostate: Secondary | ICD-10-CM | POA: Diagnosis not present

## 2020-01-07 LAB — PSA: PSA: <0.1 ng/mL (ref <=4.0)

## 2020-01-07 NOTE — Progress Notes (Signed)
Subjective:    Patient ID: [name]., male    DOB: [birth year], 79 y.o.   MRN: [id number]  HPI  Patient is here today for physical exam.  He is fallen twice in the last year.  Once while going to the mailbox.  He has not suffered any serious injuries however he is walking now with a cane.  I have the Postal [employer] move his mailbox closer to his home to avoid him having to walk across the road.  He is still using Valium at night.  He has stopped gabapentin.  He has stopped Benadryl.  He is no longer taking pain medication.  Please see previous office visits.  At times he is confused and I have thought that it is due to polypharmacy.  However he states that if he does not take the Valium at night he is unable to sleep.  He states that as soon as he closes his eyes, he is confronted with the image of his dead son lying in the floor.  The patient found his deceased son earlier this year.  He is haunted by damage.  The patient tears up just discussing the situation.  He is constantly thinking about what he could have or should have done differently that could have prevented this.  I have offered the patient counseling however he declines this at the present time.  He denies any depression.  Overall he states that he feels well.  He feels that his overall health is better.  However he is mainly confronted with insomnia when he tries to sleep and he is no longer distracted.  He does have some mild memory impairment.  However this is stable and has not progressed.  Dorsalis pedis pulses weak  Immunization History  Administered Date(s) Administered  . Fluad Quad(high Dose 65+) 04/01/2019  . Influenza Split 04/21/2014  . Influenza,inj,Quad PF,6+ Mos 04/20/2013, 07/11/2015, 05/27/2016, 04/23/2018  . Influenza-Unspecified 04/20/2013, 07/11/2015, 05/27/2016  . PFIZER SARS-COV-2 Vaccination 08/30/2019, 09/20/2019  . Pneumococcal Conjugate-13 07/17/2015  . Pneumococcal Polysaccharide-23 09/06/2010  .  Tdap 08/05/2010  . Zoster 08/08/2015     Past Medical History:  Diagnosis Date  . Arthritis    lower back  . Chronic back pain    Disc disease  . Chronic kidney disease   . Coronary atherosclerosis of native coronary artery    Coronary calcifications by chest CT, Myoview demonstrating inferolateral scar  . Deafness in right ear   . Diabetes mellitus   . Diabetic retinopathy ([place])    moderate nonproliferative with macular edema in right eye  . Diastolic dysfunction 0/1779   Grade 1.  . Essential hypertension, benign   . Heart murmur    in past  . HOH (hard of hearing)   . Hyperkalemia   . Kidney stones   . Mixed hyperlipidemia   . NAFLD (nonalcoholic fatty liver disease)   . Pancreatic insufficiency    Diagnosed at [facility]  . Prostate cancer ([place]) 2011  . Shortness of breath dyspnea    occasional - liver pressing on right lung, decreased capacity  . Subdural hematoma ([place])   . Type 2 diabetes mellitus ([place])   . Wears hearing aid    "crossover" form right to left   Past Surgical History:  Procedure Laterality Date  . APPENDECTOMY    . Bilateral hip replacement    . CATARACT EXTRACTION W/PHACO Left 05/15/2015   Procedure: CATARACT EXTRACTION PHACO AND INTRAOCULAR LENS PLACEMENT (IOC);  Surgeon: Rodena Piety  Thomes Lolling, MD;  Location: [place];  Service: Ophthalmology;  Laterality: Left;  DIABETIC - insulin pump and oral meds  . CATARACT EXTRACTION W/PHACO Right 08/28/2015   Procedure: CATARACT EXTRACTION PHACO AND INTRAOCULAR LENS PLACEMENT (IOC);  Surgeon: Ronnell Freshwater, MD;  Location: [place];  Service: Ophthalmology;  Laterality: Right;  DIABETIC PER PT AND CINDY PLEASE KEEP ARRIVAL TIME AFTER 8AM   . CHOLECYSTECTOMY    . HERNIA REPAIR    . PROSTATECTOMY  2011   Current Outpatient Medications on File Prior to Visit  Medication Sig Dispense Refill  . acetaminophen (TYLENOL) 325 MG tablet Take 2 tablets (650 mg total) by mouth every  6 (six) hours as needed for mild pain (or Fever >/= 101).    Marland Kitchen albuterol (PROVENTIL HFA;VENTOLIN HFA) 108 (90 Base) MCG/ACT inhaler Inhale 2 puffs into the lungs every 4 (four) hours as needed for wheezing or shortness of breath. 1 Inhaler 0  . amLODipine (NORVASC) 10 MG tablet Take 1 tablet (10 mg total) by mouth daily. 90 tablet 3  . aspirin EC 81 MG tablet Take 81 mg by mouth at bedtime.     . carvedilol (COREG) 25 MG tablet TAKE 1 TABLET(25 MG) BY MOUTH TWICE DAILY WITH A MEAL 90 tablet 3  . doxazosin (CARDURA) 8 MG tablet TAKE 1 TABLET(8 MG) BY MOUTH DAILY 90 tablet 3  . furosemide (LASIX) 20 MG tablet Take 1.5 tablets (30 mg total) by mouth daily. 135 tablet 3  . gabapentin (NEURONTIN) 300 MG capsule TAKE 2 CAPSULES(600 MG) BY MOUTH AT BEDTIME 180 capsule 3  . insulin glargine (LANTUS) 100 UNIT/ML injection Inject into the skin.    Marland Kitchen insulin NPH Human (HUMULIN N,NOVOLIN N) 100 UNIT/ML injection Inject 8-12 Units into the skin at bedtime. Take 12 units qam and 10 units qhs    . insulin regular (NOVOLIN R,HUMULIN R) 100 units/mL injection Inject 8 Units into the skin 3 (three) times daily before meals. 12u qam - 8u qhs    . ketoconazole (NIZORAL) 2 % cream     . metFORMIN (GLUCOPHAGE) 500 MG tablet Take 1,000 mg by mouth 2 (two) times daily with a meal.    . Multiple Vitamin (MULTIVITAMIN WITH MINERALS) TABS Take 1 tablet by mouth daily.    . Pancrelipase, Lip-Prot-Amyl, (ZENPEP) 10000-32000 units CPEP Take 1 capsule by mouth 3 (three) times daily before meals. 270 capsule 0  . pravastatin (PRAVACHOL) 10 MG tablet TAKE 1 TABLET(10 MG) BY MOUTH DAILY WITH BREAKFAST 90 tablet 3  . traZODone (DESYREL) 50 MG tablet Take 1 tablet (50 mg total) by mouth at bedtime as needed for sleep. 30 tablet 3  . valACYclovir (VALTREX) 1000 MG tablet Take 1 tablet (1,000 mg total) by mouth 3 (three) times daily. 21 tablet 0   No current facility-administered medications on file prior to visit.   Allergies   Allergen Reactions  . Enalapril Maleate Cough   Social History   Socioeconomic History  . Marital status: Married    Spouse name: Not on file  . Number of children: Not on file  . Years of education: Not on file  . Highest education level: Not on file  Occupational History  . Not on file  Tobacco Use  . Smoking status: Never Smoker  . Smokeless tobacco: Never Used  Substance and Sexual Activity  . Alcohol use: No  . Drug use: No  . Sexual activity: Not on file  Other Topics Concern  .  Not on file  Social History Narrative  . Not on file   Social Determinants of Health   Financial Resource Strain:   . Difficulty of Paying Living Expenses:   Food Insecurity:   . Worried About Charity fundraiser in the Last Year:   . Arboriculturist in the Last Year:   Transportation Needs:   . Film/video editor (Medical):   Marland Kitchen Lack of Transportation (Non-Medical):   Physical Activity:   . Days of Exercise per Week:   . Minutes of Exercise per Session:   Stress:   . Feeling of Stress :   Social Connections:   . Frequency of Communication with Friends and Family:   . Frequency of Social Gatherings with Friends and Family:   . Attends Religious Services:   . Active Member of Clubs or Organizations:   . Attends Archivist Meetings:   Marland Kitchen Marital Status:   Intimate Partner Violence:   . Fear of Current or Ex-Partner:   . Emotionally Abused:   Marland Kitchen Physically Abused:   . Sexually Abused:     Family History  Problem Relation Age of Onset  . Diabetes type II Mother   . Heart attack Father      Review of Systems  All other systems reviewed and are negative.      Objective:   Physical Exam  Constitutional: He is oriented to person, place, and time. He appears well-developed and well-nourished. No distress.  HENT:  Head: Normocephalic and atraumatic.  Right Ear: External ear normal.  Left Ear: External ear normal.  Nose: Nose normal.  Mouth/Throat: Oropharynx  is clear and moist. No oropharyngeal exudate.  Eyes: Pupils are equal, round, and reactive to light. Conjunctivae and EOM are normal. Right eye exhibits no discharge. Left eye exhibits no discharge. No scleral icterus.  Neck: No JVD present. No tracheal deviation present. No thyromegaly present.  Cardiovascular: Normal rate, regular rhythm, normal heart sounds and intact distal pulses. Exam reveals no gallop and no friction rub.  No murmur heard. Pulmonary/Chest: Effort normal. No stridor. He has no decreased breath sounds. He has no wheezes. He has no rhonchi. He has rales in the right middle field. He exhibits no tenderness.  Abdominal: Soft. Bowel sounds are normal. He exhibits no distension and no mass. There is no abdominal tenderness. There is no rebound and no guarding.  Musculoskeletal:        General: No tenderness, deformity or edema.     Cervical back: Neck supple.  Lymphadenopathy:    He has no cervical adenopathy.  Neurological: He is oriented to person, place, and time. He has normal reflexes. He displays normal reflexes. No cranial nerve deficit. He exhibits normal muscle tone. Coordination normal.  Skin: No rash noted. He is not diaphoretic. No erythema. No pallor.  Psychiatric: He has a normal mood and affect. His behavior is normal. Judgment and thought content normal. Cognition and memory are normal.  Vitals reviewed.         Assessment & Plan:  History of prostate cancer - Plan: PSA  NASH (nonalcoholic steatohepatitis)  Stage 3b chronic kidney disease  Pancreatic insufficiency  DM type 2 with diabetic peripheral neuropathy (HCC)  Noise-induced hearing loss of right ear with restricted hearing of left ear  Primary insomnia  Memory impairment  History of ASCVD  Benign essential HTN  Diabetic foot exam was performed today and is normal.  Patient sees his eye doctor regularly and diabetic eye  exams up-to-date.  He had cataract surgery earlier this year.  He  has a history of pancreatic cancer.  He is due to recheck his PSA today.  I have recently checked a CMP which showed no evidence of any liver inflammation.  Blood pressure today is well controlled at 130/74.  Diabetes is managed by his endocrinologist although recently has had several episodes of hypoglycemia.  I have recommended that he decrease his regular insulin from 10 units with meals to 5 units with meals until he sees his endocrinologist as he has had several episodes with his blood sugar in the 40s.  We discussed the risk of sedating medication given his history of confusion.  I have strongly strongly encouraged the patient to avoid polypharmacy.  He insists on using the Valium at night due to his PTSD.  Therefore we will continue the Valium at night but I want him to discontinue gabapentin and Benadryl completely.

## 2020-01-08 ENCOUNTER — Other Ambulatory Visit: Payer: Self-pay | Admitting: Family Medicine

## 2020-01-10 NOTE — Telephone Encounter (Signed)
Requested Prescriptions   Pending Prescriptions Disp Refills  . diazepam (VALIUM) 5 MG tablet [Pharmacy Med Name: DIAZEPAM 5MG TABLETS] 60 tablet 0    Sig: TAKE 1 TABLET BY MOUTH TWICE DAILY AS NEEDED FOR ANXIETY     Last OV 01/07/2020   Last written 09/06/2019

## 2020-01-12 DIAGNOSIS — Z794 Long term (current) use of insulin: Secondary | ICD-10-CM | POA: Diagnosis not present

## 2020-01-12 DIAGNOSIS — E1159 Type 2 diabetes mellitus with other circulatory complications: Secondary | ICD-10-CM | POA: Diagnosis not present

## 2020-01-12 DIAGNOSIS — E11649 Type 2 diabetes mellitus with hypoglycemia without coma: Secondary | ICD-10-CM | POA: Diagnosis not present

## 2020-01-12 DIAGNOSIS — N1832 Chronic kidney disease, stage 3b: Secondary | ICD-10-CM | POA: Diagnosis not present

## 2020-01-12 DIAGNOSIS — E113293 Type 2 diabetes mellitus with mild nonproliferative diabetic retinopathy without macular edema, bilateral: Secondary | ICD-10-CM | POA: Diagnosis not present

## 2020-01-12 DIAGNOSIS — E1142 Type 2 diabetes mellitus with diabetic polyneuropathy: Secondary | ICD-10-CM | POA: Diagnosis not present

## 2020-01-12 DIAGNOSIS — E1122 Type 2 diabetes mellitus with diabetic chronic kidney disease: Secondary | ICD-10-CM | POA: Diagnosis not present

## 2020-01-24 ENCOUNTER — Other Ambulatory Visit: Payer: Self-pay

## 2020-01-24 ENCOUNTER — Encounter: Payer: Self-pay | Admitting: Podiatry

## 2020-01-24 ENCOUNTER — Ambulatory Visit: Payer: PPO | Admitting: Podiatry

## 2020-01-24 DIAGNOSIS — I739 Peripheral vascular disease, unspecified: Secondary | ICD-10-CM | POA: Diagnosis not present

## 2020-01-24 DIAGNOSIS — M79674 Pain in right toe(s): Secondary | ICD-10-CM

## 2020-01-24 DIAGNOSIS — B351 Tinea unguium: Secondary | ICD-10-CM | POA: Diagnosis not present

## 2020-01-24 DIAGNOSIS — L84 Corns and callosities: Secondary | ICD-10-CM | POA: Diagnosis not present

## 2020-01-24 DIAGNOSIS — E1142 Type 2 diabetes mellitus with diabetic polyneuropathy: Secondary | ICD-10-CM

## 2020-01-24 DIAGNOSIS — M79675 Pain in left toe(s): Secondary | ICD-10-CM

## 2020-01-24 NOTE — Patient Instructions (Signed)
Diabetes Mellitus and Foot Care Foot care is an important part of your health, especially when you have diabetes. Diabetes may cause you to have problems because of poor blood flow (circulation) to your feet and legs, which can cause your skin to:  Become thinner and drier.  Break more easily.  Heal more slowly.  Peel and crack. You may also have nerve damage (neuropathy) in your legs and feet, causing decreased feeling in them. This means that you may not notice minor injuries to your feet that could lead to more serious problems. Noticing and addressing any potential problems early is the best way to prevent future foot problems. How to care for your feet Foot hygiene  Wash your feet daily with warm water and mild soap. Do not use hot water. Then, pat your feet and the areas between your toes until they are completely dry. Do not soak your feet as this can dry your skin.  Trim your toenails straight across. Do not dig under them or around the cuticle. File the edges of your nails with an emery board or nail file.  Apply a moisturizing lotion or petroleum jelly to the skin on your feet and to dry, brittle toenails. Use lotion that does not contain alcohol and is unscented. Do not apply lotion between your toes. Shoes and socks  Wear clean socks or stockings every day. Make sure they are not too tight. Do not wear knee-high stockings since they may decrease blood flow to your legs.  Wear shoes that fit properly and have enough cushioning. Always look in your shoes before you put them on to be sure there are no objects inside.  To break in new shoes, wear them for just a few hours a day. This prevents injuries on your feet. Wounds, scrapes, corns, and calluses  Check your feet daily for blisters, cuts, bruises, sores, and redness. If you cannot see the bottom of your feet, use a mirror or ask someone for help.  Do not cut corns or calluses or try to remove them with medicine.  If you  find a minor scrape, cut, or break in the skin on your feet, keep it and the skin around it clean and dry. You may clean these areas with mild soap and water. Do not clean the area with peroxide, alcohol, or iodine.  If you have a wound, scrape, corn, or callus on your foot, look at it several times a day to make sure it is healing and not infected. Check for: ? Redness, swelling, or pain. ? Fluid or blood. ? Warmth. ? Pus or a bad smell. General instructions  Do not cross your legs. This may decrease blood flow to your feet.  Do not use heating pads or hot water bottles on your feet. They may burn your skin. If you have lost feeling in your feet or legs, you may not know this is happening until it is too late.  Protect your feet from hot and cold by wearing shoes, such as at the beach or on hot pavement.  Schedule a complete foot exam at least once a year (annually) or more often if you have foot problems. If you have foot problems, report any cuts, sores, or bruises to your health care provider immediately. Contact a health care provider if:  You have a medical condition that increases your risk of infection and you have any cuts, sores, or bruises on your feet.  You have an injury that is not   healing.  You have redness on your legs or feet.  You feel burning or tingling in your legs or feet.  You have pain or cramps in your legs and feet.  Your legs or feet are numb.  Your feet always feel cold.  You have pain around a toenail. Get help right away if:  You have a wound, scrape, corn, or callus on your foot and: ? You have pain, swelling, or redness that gets worse. ? You have fluid or blood coming from the wound, scrape, corn, or callus. ? Your wound, scrape, corn, or callus feels warm to the touch. ? You have pus or a bad smell coming from the wound, scrape, corn, or callus. ? You have a fever. ? You have a red line going up your leg. Summary  Check your feet every day  for cuts, sores, red spots, swelling, and blisters.  Moisturize feet and legs daily.  Wear shoes that fit properly and have enough cushioning.  If you have foot problems, report any cuts, sores, or bruises to your health care provider immediately.  Schedule a complete foot exam at least once a year (annually) or more often if you have foot problems. This information is not intended to replace advice given to you by your health care provider. Make sure you discuss any questions you have with your health care provider. Document Revised: 04/14/2019 Document Reviewed: 08/23/2016 Elsevier Patient Education  2020 Elsevier Inc.  

## 2020-01-25 NOTE — Progress Notes (Signed)
Subjective: Mark L Grose Sr. presents today at risk foot care with history of peripheral neuropathy and painful callus(es) plantarly b/l and painful mycotic toenails b/l that are difficult to trim. Pain interferes with ambulation. Aggravating factors include wearing enclosed shoe gear. Pain is relieved with periodic professional debridement.  [name], MD is patient's PCP. Last visit was: 01/07/2020.  He voices no new pedal problems on today's visit.  Past Medical History:  Diagnosis Date  . Arthritis    lower back  . Chronic back pain    Disc disease  . Chronic kidney disease   . Coronary atherosclerosis of native coronary artery    Coronary calcifications by chest CT, Myoview demonstrating inferolateral scar  . Deafness in right ear   . Diabetes mellitus   . Diabetic retinopathy ([place])    moderate nonproliferative with macular edema in right eye  . Diastolic dysfunction 01/7590   Grade 1.  . Essential hypertension, benign   . Heart murmur    in past  . HOH (hard of hearing)   . Hyperkalemia   . Kidney stones   . Mixed hyperlipidemia   . NAFLD (nonalcoholic fatty liver disease)   . Pancreatic insufficiency    Diagnosed at [facility]  . Prostate cancer ([place]) 2011  . Shortness of breath dyspnea    occasional - liver pressing on right lung, decreased capacity  . Subdural hematoma ([place])   . Type 2 diabetes mellitus ([place])   . Wears hearing aid    "crossover" form right to left     Current Outpatient Medications on File Prior to Visit  Medication Sig Dispense Refill  . diazepam (VALIUM) 5 MG tablet TAKE 1 TABLET BY MOUTH TWICE DAILY AS NEEDED FOR ANXIETY 60 tablet 0  . acetaminophen (TYLENOL) 325 MG tablet Take 2 tablets (650 mg total) by mouth every 6 (six) hours as needed for mild pain (or Fever >/= 101).    Marland Kitchen albuterol (PROVENTIL HFA;VENTOLIN HFA) 108 (90 Base) MCG/ACT inhaler Inhale 2 puffs into the lungs every 4 (four) hours as needed for wheezing or shortness of  breath. 1 Inhaler 0  . amLODipine (NORVASC) 10 MG tablet Take 1 tablet (10 mg total) by mouth daily. 90 tablet 3  . aspirin EC 81 MG tablet Take 81 mg by mouth at bedtime.     . carvedilol (COREG) 25 MG tablet TAKE 1 TABLET(25 MG) BY MOUTH TWICE DAILY WITH A MEAL 90 tablet 3  . doxazosin (CARDURA) 8 MG tablet TAKE 1 TABLET(8 MG) BY MOUTH DAILY 90 tablet 3  . furosemide (LASIX) 20 MG tablet Take 1.5 tablets (30 mg total) by mouth daily. 135 tablet 3  . insulin glargine (LANTUS) 100 UNIT/ML injection Inject 18 Units into the skin.     Marland Kitchen insulin regular (NOVOLIN R,HUMULIN R) 100 units/mL injection Inject 8 Units into the skin 3 (three) times daily before meals. 12u am  10u supper    . ketoconazole (NIZORAL) 2 % cream     . Magnesium (V-R MAGNESIUM) 250 MG TABS Take by mouth.    . metFORMIN (GLUCOPHAGE) 500 MG tablet Take 1,000 mg by mouth 2 (two) times daily with a meal.    . metFORMIN (GLUCOPHAGE-XR) 500 MG 24 hr tablet Take 1,000 mg by mouth 2 (two) times daily.    . Multiple Vitamin (MULTIVITAMIN WITH MINERALS) TABS Take 1 tablet by mouth daily.    . Pancrelipase, Lip-Prot-Amyl, (ZENPEP) 10000-32000 units CPEP Take 1 capsule by mouth 3 (three) times  daily before meals. 270 capsule 0  . pravastatin (PRAVACHOL) 10 MG tablet TAKE 1 TABLET(10 MG) BY MOUTH DAILY WITH BREAKFAST 90 tablet 3  . valACYclovir (VALTREX) 1000 MG tablet Take 1 tablet (1,000 mg total) by mouth 3 (three) times daily. 21 tablet 0   No current facility-administered medications on file prior to visit.     Allergies  Allergen Reactions  . Enalapril Maleate Cough    Objective: Mark L Corpus Sr. is a pleasant 79 y.o. male obese in NAD. AAO x 3.  There were no vitals filed for this visit.  Vascular Examination: Capillary refill time to digits immediate b/l. Nonpalpable DP pulse(s) b/l lower extremities. Nonpalpable PT pulse(s) b/l lower extremities. Pedal hair absent. Lower extremity skin temperature gradient within  normal limits. No ischemia or gangrene noted b/l lower extremities.  Dermatological Examination: Pedal skin with normal turgor, texture and tone bilaterally. No open wounds bilaterally. No interdigital macerations bilaterally. Toenails 1-5 b/l elongated, discolored, dystrophic, thickened, crumbly with subungual debris and tenderness to dorsal palpation. Hyperkeratotic lesion(s) submet head 5 right foot.  No erythema, no edema, no drainage, no flocculence.  Musculoskeletal: Normal muscle strength 5/5 to all lower extremity muscle groups bilaterally. No pain crepitus or joint limitation noted with ROM b/l. Hammertoes noted to the L 5th toe and R 5th toe. Utilizes cane for ambulation assistance.  Neurological Examination: Protective sensation decreased with 10 gram monofilament b/l. Vibratory sensation decreased b/l. Proprioception intact bilaterally.  Last A1c: Hemoglobin A1C Latest Ref Rng & Units 07/20/2019  HGBA1C <5.7 % of total Hgb 7.3(H)  Some recent data might be hidden    Assessment: 1. Pain due to onychomycosis of toenails of both feet   2. Callus   3. PAD (peripheral artery disease) ([place])   4. Diabetic peripheral neuropathy associated with type 2 diabetes mellitus ([place])     Plan: -Examined patient. -No new findings. No new orders. -Continue diabetic foot care principles. -Toenails 1-5 b/l were debrided in length and girth with sterile nail nippers and dremel without iatrogenic bleeding.  -Callus(es) submet head 5 right foot pared utilizing sterile scalpel blade without complication or incident. Total number debrided =1. -Patient to report any pedal injuries to medical professional immediately. -Patient to continue soft, supportive shoe gear daily. -Patient/POA to call should there be question/concern in the interim.  Return in about 3 months (around 04/25/2020) for diabetic nail and callus trim.  Marzetta Board, DPM

## 2020-02-04 DIAGNOSIS — H903 Sensorineural hearing loss, bilateral: Secondary | ICD-10-CM | POA: Diagnosis not present

## 2020-02-04 DIAGNOSIS — H9212 Otorrhea, left ear: Secondary | ICD-10-CM | POA: Diagnosis not present

## 2020-02-04 DIAGNOSIS — H9202 Otalgia, left ear: Secondary | ICD-10-CM | POA: Diagnosis not present

## 2020-02-08 DIAGNOSIS — Z961 Presence of intraocular lens: Secondary | ICD-10-CM | POA: Diagnosis not present

## 2020-02-10 ENCOUNTER — Other Ambulatory Visit: Payer: Self-pay | Admitting: Family Medicine

## 2020-02-10 DIAGNOSIS — K861 Other chronic pancreatitis: Secondary | ICD-10-CM

## 2020-02-22 ENCOUNTER — Ambulatory Visit: Payer: PPO | Admitting: Orthopaedic Surgery

## 2020-02-22 DIAGNOSIS — E113313 Type 2 diabetes mellitus with moderate nonproliferative diabetic retinopathy with macular edema, bilateral: Secondary | ICD-10-CM | POA: Diagnosis not present

## 2020-03-01 ENCOUNTER — Other Ambulatory Visit: Payer: Self-pay | Admitting: Family Medicine

## 2020-03-01 NOTE — Telephone Encounter (Signed)
Last OV 01/07/20 Last refill 01/10/20

## 2020-03-02 ENCOUNTER — Encounter: Payer: Self-pay | Admitting: Orthopaedic Surgery

## 2020-03-02 ENCOUNTER — Ambulatory Visit: Payer: PPO

## 2020-03-02 ENCOUNTER — Other Ambulatory Visit: Payer: Self-pay

## 2020-03-02 ENCOUNTER — Ambulatory Visit: Payer: PPO | Admitting: Orthopaedic Surgery

## 2020-03-02 VITALS — BP 159/88 | HR 81 | Ht 69.0 in | Wt 194.0 lb

## 2020-03-02 DIAGNOSIS — M25552 Pain in left hip: Secondary | ICD-10-CM

## 2020-03-02 DIAGNOSIS — M545 Low back pain, unspecified: Secondary | ICD-10-CM

## 2020-03-02 DIAGNOSIS — Z96643 Presence of artificial hip joint, bilateral: Secondary | ICD-10-CM | POA: Diagnosis not present

## 2020-03-02 DIAGNOSIS — M25551 Pain in right hip: Secondary | ICD-10-CM

## 2020-03-02 NOTE — Progress Notes (Signed)
Subjective:    Patient ID: Mark Hose., male    DOB: 20-Jun-1941, 79 y.o.   MRN: [id number]  HPI He has bilateral total hips done at [facility] about 22 years ago or so.  He has been going there about every two years for a "check-up" on the hips.  His doctor has now retired. He would like for me to get x-rays of the hip now.  He has no hip pain.  He has significant DJD of the lower back.  He has no trauma, no redness, no swelling, no numbness.     Review of Systems  Constitutional: Positive for activity change.  Musculoskeletal: Positive for arthralgias, back pain, gait problem and joint swelling.  All other systems reviewed and are negative.  For Review of Systems, all other systems reviewed and are negative.  The following is a summary of the past history medically, past history surgically, known current medicines, social history and family history.  This information is gathered electronically by the computer from prior information and documentation.  I review this each visit and have found including this information at this point in the chart is beneficial and informative.   Past Medical History:  Diagnosis Date  . Arthritis    lower back  . Chronic back pain    Disc disease  . Chronic kidney disease   . Coronary atherosclerosis of native coronary artery    Coronary calcifications by chest CT, Myoview demonstrating inferolateral scar  . Deafness in right ear   . Diabetes mellitus   . Diabetic retinopathy ([place])    moderate nonproliferative with macular edema in right eye  . Diastolic dysfunction 12/3792   Grade 1.  . Essential hypertension, benign   . Heart murmur    in past  . HOH (hard of hearing)   . Hyperkalemia   . Kidney stones   . Mixed hyperlipidemia   . NAFLD (nonalcoholic fatty liver disease)   . Pancreatic insufficiency    Diagnosed at [facility]  . Prostate cancer ([place]) 2011  . Shortness of breath dyspnea    occasional - liver pressing on right  lung, decreased capacity  . Subdural hematoma ([place])   . Type 2 diabetes mellitus ([place])   . Wears hearing aid    "crossover" form right to left    Past Surgical History:  Procedure Laterality Date  . APPENDECTOMY    . Bilateral hip replacement    . CATARACT EXTRACTION W/PHACO Left 05/15/2015   Procedure: CATARACT EXTRACTION PHACO AND INTRAOCULAR LENS PLACEMENT (IOC);  Surgeon: Ronnell Freshwater, MD;  Location: [place];  Service: Ophthalmology;  Laterality: Left;  DIABETIC - insulin pump and oral meds  . CATARACT EXTRACTION W/PHACO Right 08/28/2015   Procedure: CATARACT EXTRACTION PHACO AND INTRAOCULAR LENS PLACEMENT (IOC);  Surgeon: Ronnell Freshwater, MD;  Location: [place];  Service: Ophthalmology;  Laterality: Right;  DIABETIC PER PT AND CINDY PLEASE KEEP ARRIVAL TIME AFTER 8AM   . CHOLECYSTECTOMY    . HERNIA REPAIR    . PROSTATECTOMY  2011    Current Outpatient Medications on File Prior to Visit  Medication Sig Dispense Refill  . acetaminophen (TYLENOL) 325 MG tablet Take 2 tablets (650 mg total) by mouth every 6 (six) hours as needed for mild pain (or Fever >/= 101).    Marland Kitchen albuterol (PROVENTIL HFA;VENTOLIN HFA) 108 (90 Base) MCG/ACT inhaler Inhale 2 puffs into the lungs every 4 (four) hours as needed for wheezing or  shortness of breath. 1 Inhaler 0  . amLODipine (NORVASC) 10 MG tablet Take 1 tablet (10 mg total) by mouth daily. 90 tablet 3  . aspirin EC 81 MG tablet Take 81 mg by mouth at bedtime.     . carvedilol (COREG) 25 MG tablet TAKE 1 TABLET(25 MG) BY MOUTH TWICE DAILY WITH A MEAL 90 tablet 3  . diazepam (VALIUM) 5 MG tablet TAKE 1 TABLET BY MOUTH TWICE DAILY AS NEEDED FOR ANXIETY 60 tablet 0  . doxazosin (CARDURA) 8 MG tablet TAKE 1 TABLET(8 MG) BY MOUTH DAILY 90 tablet 3  . furosemide (LASIX) 20 MG tablet Take 1.5 tablets (30 mg total) by mouth daily. 135 tablet 3  . insulin glargine (LANTUS) 100 UNIT/ML injection Inject 18 Units into the  skin.     Marland Kitchen insulin regular (NOVOLIN R,HUMULIN R) 100 units/mL injection Inject 8 Units into the skin 3 (three) times daily before meals. 12u am  10u supper    . ketoconazole (NIZORAL) 2 % cream     . Magnesium (V-R MAGNESIUM) 250 MG TABS Take by mouth.    . metFORMIN (GLUCOPHAGE) 500 MG tablet Take 1,000 mg by mouth 2 (two) times daily with a meal.    . metFORMIN (GLUCOPHAGE-XR) 500 MG 24 hr tablet Take 1,000 mg by mouth 2 (two) times daily.    . Multiple Vitamin (MULTIVITAMIN WITH MINERALS) TABS Take 1 tablet by mouth daily.    . pravastatin (PRAVACHOL) 10 MG tablet TAKE 1 TABLET(10 MG) BY MOUTH DAILY WITH BREAKFAST 90 tablet 3  . valACYclovir (VALTREX) 1000 MG tablet Take 1 tablet (1,000 mg total) by mouth 3 (three) times daily. 21 tablet 0  . ZENPEP 10000-32000 units CPEP TAKE 1 CAPSULE BY MOUTH THREE TIMES DAILY BEFORE MEALS 270 capsule 0   No current facility-administered medications on file prior to visit.    Social History   Socioeconomic History  . Marital status: Married    Spouse name: Not on file  . Number of children: Not on file  . Years of education: Not on file  . Highest education level: Not on file  Occupational History  . Not on file  Tobacco Use  . Smoking status: Never Smoker  . Smokeless tobacco: Never Used  Substance and Sexual Activity  . Alcohol use: No  . Drug use: No  . Sexual activity: Not on file  Other Topics Concern  . Not on file  Social History Narrative  . Not on file   Social Determinants of Health   Financial Resource Strain:   . Difficulty of Paying Living Expenses:   Food Insecurity:   . Worried About Charity fundraiser in the Last Year:   . Arboriculturist in the Last Year:   Transportation Needs:   . Film/video editor (Medical):   Marland Kitchen Lack of Transportation (Non-Medical):   Physical Activity:   . Days of Exercise per Week:   . Minutes of Exercise per Session:   Stress:   . Feeling of Stress :   Social Connections:   .  Frequency of Communication with Friends and Family:   . Frequency of Social Gatherings with Friends and Family:   . Attends Religious Services:   . Active Member of Clubs or Organizations:   . Attends Archivist Meetings:   Marland Kitchen Marital Status:   Intimate Partner Violence:   . Fear of Current or Ex-Partner:   . Emotionally Abused:   Marland Kitchen Physically Abused:   .  Sexually Abused:     Family History  Problem Relation Age of Onset  . Diabetes type II Mother   . Heart attack Father     BP (!) 159/88   Pulse 81   Ht 5' 9"  (1.753 m)   Wt 194 lb (88 kg)   BMI 28.65 kg/m   Body mass index is 28.65 kg/m.     Objective:   Physical Exam Vitals and nursing note reviewed.  Constitutional:      Appearance: He is well-developed.  HENT:     Head: Normocephalic and atraumatic.  Eyes:     Conjunctiva/sclera: Conjunctivae normal.     Pupils: Pupils are equal, round, and reactive to light.  Cardiovascular:     Rate and Rhythm: Normal rate and regular rhythm.  Pulmonary:     Effort: Pulmonary effort is normal.  Abdominal:     Palpations: Abdomen is soft.  Musculoskeletal:     Cervical back: Normal range of motion and neck supple.       Legs:  Skin:    General: Skin is warm and dry.  Neurological:     Mental Status: He is alert and oriented to person, place, and time.     Cranial Nerves: No cranial nerve deficit.     Motor: No abnormal muscle tone.     Coordination: Coordination normal.     Deep Tendon Reflexes: Reflexes are normal and symmetric. Reflexes normal.  Psychiatric:        Behavior: Behavior normal.        Thought Content: Thought content normal.        Judgment: Judgment normal.   X-rays of both hips were done and the pelvis, reported separately.        Assessment & Plan:   Encounter Diagnoses  Name Primary?  . Pain in right hip Yes  . Pain in left hip   . Status post total hip replacement, bilateral   . Lumbar pain    His hips look good.  His  back is another story and I will see him at another time for this.  I will see him in two years for the hips as usual.  Call if any problem.  Precautions discussed.   Electronically Signed Sanjuana Kava, MD 7/29/202112:21 PM

## 2020-03-14 ENCOUNTER — Ambulatory Visit: Payer: PPO

## 2020-03-14 ENCOUNTER — Encounter: Payer: Self-pay | Admitting: Orthopaedic Surgery

## 2020-03-14 ENCOUNTER — Other Ambulatory Visit: Payer: Self-pay

## 2020-03-14 ENCOUNTER — Ambulatory Visit: Payer: PPO | Admitting: Orthopaedic Surgery

## 2020-03-14 VITALS — BP 152/81 | HR 81 | Ht 68.5 in | Wt 194.0 lb

## 2020-03-14 DIAGNOSIS — M549 Dorsalgia, unspecified: Secondary | ICD-10-CM

## 2020-03-14 NOTE — Addendum Note (Signed)
Addended by: Elizabeth Sauer on: 03/14/2020 04:16 PM   Modules accepted: Orders

## 2020-03-14 NOTE — Progress Notes (Signed)
Patient XF:GHWEXH L Mark Lent., male DOB:1940-12-31, 79 y.o. BZJ:[id number]  Chief Complaint  Patient presents with  . Back Pain    Lower back pain    HPI  Mark L Soeder Sr. is a 79 y.o. male who has a long history of lower back pain.  It is localized to the lower back. He has no radiation.  He has no trauma.  He has pain with activity and has to sit down.  In a few minutes he feels better and starts back.  He has bilateral total hips.  He has no weakness.  He has no numbness.  He is taking medicine.     Body mass index is 29.07 kg/m.  ROS  Review of Systems  Constitutional: Positive for activity change.  Musculoskeletal: Positive for arthralgias, back pain, gait problem and joint swelling.  All other systems reviewed and are negative.   All other systems reviewed and are negative.  The following is a summary of the past history medically, past history surgically, known current medicines, social history and family history.  This information is gathered electronically by the computer from prior information and documentation.  I review this each visit and have found including this information at this point in the chart is beneficial and informative.    Past Medical History:  Diagnosis Date  . Arthritis    lower back  . Chronic back pain    Disc disease  . Chronic kidney disease   . Coronary atherosclerosis of native coronary artery    Coronary calcifications by chest CT, Myoview demonstrating inferolateral scar  . Deafness in right ear   . Diabetes mellitus   . Diabetic retinopathy ([place])    moderate nonproliferative with macular edema in right eye  . Diastolic dysfunction 0/1751   Grade 1.  . Essential hypertension, benign   . Heart murmur    in past  . HOH (hard of hearing)   . Hyperkalemia   . Kidney stones   . Mixed hyperlipidemia   . NAFLD (nonalcoholic fatty liver disease)   . Pancreatic insufficiency    Diagnosed at [facility]  . Prostate cancer ([place]) 2011  .  Shortness of breath dyspnea    occasional - liver pressing on right lung, decreased capacity  . Subdural hematoma ([place])   . Type 2 diabetes mellitus ([place])   . Wears hearing aid    "crossover" form right to left    Past Surgical History:  Procedure Laterality Date  . APPENDECTOMY    . Bilateral hip replacement    . CATARACT EXTRACTION W/PHACO Left 05/15/2015   Procedure: CATARACT EXTRACTION PHACO AND INTRAOCULAR LENS PLACEMENT (IOC);  Surgeon: Ronnell Freshwater, MD;  Location: [place];  Service: Ophthalmology;  Laterality: Left;  DIABETIC - insulin pump and oral meds  . CATARACT EXTRACTION W/PHACO Right 08/28/2015   Procedure: CATARACT EXTRACTION PHACO AND INTRAOCULAR LENS PLACEMENT (IOC);  Surgeon: Ronnell Freshwater, MD;  Location: [place];  Service: Ophthalmology;  Laterality: Right;  DIABETIC PER PT AND CINDY PLEASE KEEP ARRIVAL TIME AFTER 8AM   . CHOLECYSTECTOMY    . HERNIA REPAIR    . PROSTATECTOMY  2011    Family History  Problem Relation Age of Onset  . Diabetes type II Mother   . Heart attack Father     Social History Social History   Tobacco Use  . Smoking status: Never Smoker  . Smokeless tobacco: Never Used  Substance Use Topics  . Alcohol use: No  .  Drug use: No    Allergies  Allergen Reactions  . Enalapril Maleate Cough    Current Outpatient Medications  Medication Sig Dispense Refill  . acetaminophen (TYLENOL) 325 MG tablet Take 2 tablets (650 mg total) by mouth every 6 (six) hours as needed for mild pain (or Fever >/= 101).    Marland Kitchen albuterol (PROVENTIL HFA;VENTOLIN HFA) 108 (90 Base) MCG/ACT inhaler Inhale 2 puffs into the lungs every 4 (four) hours as needed for wheezing or shortness of breath. 1 Inhaler 0  . amLODipine (NORVASC) 10 MG tablet Take 1 tablet (10 mg total) by mouth daily. 90 tablet 3  . aspirin EC 81 MG tablet Take 81 mg by mouth at bedtime.     . carvedilol (COREG) 25 MG tablet TAKE 1 TABLET(25 MG) BY  MOUTH TWICE DAILY WITH A MEAL 90 tablet 3  . diazepam (VALIUM) 5 MG tablet TAKE 1 TABLET BY MOUTH TWICE DAILY AS NEEDED FOR ANXIETY 60 tablet 0  . doxazosin (CARDURA) 8 MG tablet TAKE 1 TABLET(8 MG) BY MOUTH DAILY 90 tablet 3  . furosemide (LASIX) 20 MG tablet Take 1.5 tablets (30 mg total) by mouth daily. 135 tablet 3  . insulin glargine (LANTUS) 100 UNIT/ML injection Inject 18 Units into the skin.     Marland Kitchen insulin regular (NOVOLIN R,HUMULIN R) 100 units/mL injection Inject 8 Units into the skin 3 (three) times daily before meals. 12u am  10u supper    . ketoconazole (NIZORAL) 2 % cream     . Magnesium (V-R MAGNESIUM) 250 MG TABS Take by mouth.    . metFORMIN (GLUCOPHAGE) 500 MG tablet Take 1,000 mg by mouth 2 (two) times daily with a meal.    . metFORMIN (GLUCOPHAGE-XR) 500 MG 24 hr tablet Take 1,000 mg by mouth 2 (two) times daily.    . Multiple Vitamin (MULTIVITAMIN WITH MINERALS) TABS Take 1 tablet by mouth daily.    . pravastatin (PRAVACHOL) 10 MG tablet TAKE 1 TABLET(10 MG) BY MOUTH DAILY WITH BREAKFAST 90 tablet 3  . valACYclovir (VALTREX) 1000 MG tablet Take 1 tablet (1,000 mg total) by mouth 3 (three) times daily. 21 tablet 0  . ZENPEP 10000-32000 units CPEP TAKE 1 CAPSULE BY MOUTH THREE TIMES DAILY BEFORE MEALS 270 capsule 0   No current facility-administered medications for this visit.     Physical Exam  Blood pressure (!) 152/81, pulse 81, height 5' 8.5" (1.74 m), weight 194 lb (88 kg).  Constitutional: overall normal hygiene, normal nutrition, well developed, normal grooming, normal body habitus. Assistive device:none  Musculoskeletal: gait and station Limp slightly right, muscle tone and strength are normal, no tremors or atrophy is present.  .  Neurological: coordination overall normal.  Deep tendon reflex/nerve stretch intact.  Sensation normal.  Cranial nerves II-XII intact.   Skin:   Normal overall no scars, lesions, ulcers or rashes. No psoriasis.  Psychiatric:  Alert and oriented x 3.  Recent memory intact, remote memory unclear.  Normal mood and affect. Well groomed.  Good eye contact.  Cardiovascular: overall no swelling, no varicosities, no edema bilaterally, normal temperatures of the legs and arms, no clubbing, cyanosis and good capillary refill.  Lymphatic: palpation is normal.  Spine/Pelvis examination:  Inspection:  Overall, sacoiliac joint benign and hips nontender; without crepitus or defects.   Thoracic spine inspection: Alignment normal without kyphosis present   Lumbar spine inspection:  Alignment  with normal lumbar lordosis, without scoliosis apparent.   Thoracic spine palpation:  without tenderness of spinal  processes   Lumbar spine palpation: with tenderness of lumbar area; with tightness of lumbar muscles    Range of Motion:   Lumbar flexion, forward flexion is 10 with pain or tenderness    Lumbar extension is 5 with pain or tenderness   Left lateral bend is Abnormal- 10 degrees  with pain or tenderness   Right lateral bend is Abnormal- 15 degrees with pain or tenderness   Straight leg raising is Normal   Strength & tone: Normal   Stability overall normal stability     All other systems reviewed and are negative   The patient has been educated about the nature of the problem(s) and counseled on treatment options.  The patient appeared to understand what I have discussed and is in agreement with it.  Encounter Diagnosis  Name Primary?  . Back pain without radiation Yes   X-rays of lumbar spine was done, reported separately.  PLAN Call if any problems.  Precautions discussed.  Continue current medications.   Return to clinic 3 weeks   Get MRI of lumbar spine.  Electronically Signed Sanjuana Kava, MD 8/10/20212:19 PM

## 2020-03-15 ENCOUNTER — Telehealth: Payer: Self-pay | Admitting: Family Medicine

## 2020-03-15 NOTE — Progress Notes (Signed)
  Chronic Care Management   Note  03/15/2020 Name: Mark HARKLEROAD Sr. MRN: [id number] DOB: July 07, 1941  Mark Jumbo Sr. is a 79 y.o. year old male who is a primary care patient of Pickard, Cammie Mcgee, MD. I reached out to [place]. by phone today in response to a referral sent by [name]'s PCP, [name], MD.   [name] was given information about Chronic Care Management services today including:  1. CCM service includes personalized support from designated clinical staff supervised by his physician, including individualized plan of care and coordination with other care providers 2. 24/7 contact phone numbers for assistance for urgent and routine care needs. 3. Service will only be billed when office clinical staff spend 20 minutes or more in a month to coordinate care. 4. Only one practitioner may furnish and bill the service in a calendar month. 5. The patient may stop CCM services at any time (effective at the end of the month) by phone call to the office staff.   Patient agreed to services and verbal consent obtained.   Follow up plan:   Carley Perdue [employer] Scheduler

## 2020-03-18 ENCOUNTER — Encounter: Payer: Self-pay | Admitting: Family Medicine

## 2020-03-22 MED ORDER — CARVEDILOL 25 MG PO TABS: ORAL_TABLET | ORAL | 3 refills | Status: DC

## 2020-03-28 ENCOUNTER — Ambulatory Visit (HOSPITAL_COMMUNITY)
Admission: RE | Admit: 2020-03-28 | Discharge: 2020-03-28 | Disposition: A | Discharge status: Home / Self Care | Payer: PPO | Source: Ambulatory Visit | Attending: Orthopaedic Surgery | Admitting: Orthopaedic Surgery

## 2020-03-28 ENCOUNTER — Other Ambulatory Visit: Payer: Self-pay

## 2020-03-28 DIAGNOSIS — M549 Dorsalgia, unspecified: Secondary | ICD-10-CM | POA: Diagnosis not present

## 2020-03-28 DIAGNOSIS — M545 Low back pain: Secondary | ICD-10-CM | POA: Diagnosis not present

## 2020-04-04 DIAGNOSIS — E113313 Type 2 diabetes mellitus with moderate nonproliferative diabetic retinopathy with macular edema, bilateral: Secondary | ICD-10-CM | POA: Diagnosis not present

## 2020-04-06 ENCOUNTER — Other Ambulatory Visit: Payer: Self-pay

## 2020-04-06 ENCOUNTER — Encounter: Payer: Self-pay | Admitting: Orthopaedic Surgery

## 2020-04-06 ENCOUNTER — Ambulatory Visit (INDEPENDENT_AMBULATORY_CARE_PROVIDER_SITE_OTHER): Payer: PPO | Admitting: Orthopaedic Surgery

## 2020-04-06 VITALS — BP 140/75 | HR 86 | Ht 68.5 in | Wt 194.0 lb

## 2020-04-06 DIAGNOSIS — M549 Dorsalgia, unspecified: Secondary | ICD-10-CM

## 2020-04-06 NOTE — Progress Notes (Signed)
Patient Mark L Mark Lent., male DOB:Jan 24, 1941, 79 y.o. XNT:[id number]  Chief Complaint  Patient presents with  . Back Pain  . Results    review MRI     HPI  Mark L Apuzzo Sr. is a 79 y.o. male who has lower back pain that is very painful.  He has good and bad days.  He had MRI done which showed: IMPRESSION: 1. Transitional S1 vertebra. 2. Degenerative disease greatest at L3-4 to L5-S1 where there is compressive spinal stenosis. 3. L4-5 asymmetric right subarticular recess stenosis which is completely effaced. 4. L5-S1 advanced right foraminal and subarticular recess impingement with right L5 and S1 compression. 5. L4-5 intervertebral ankylosis.  I have explained the findings to him.  I have recommended he see neurosurgeon for baseline visit and possible epidural.  He declines.  He says he will just live with the pain.  He finds if he sits for a while or lays down for a while his pain usually lessens.  I have no problem with this.  If it gets worse, he can always change his mind.  I have independently reviewed the MRI.        Body mass index is 29.07 kg/m.  ROS  Review of Systems  Constitutional: Positive for activity change.  Musculoskeletal: Positive for arthralgias, back pain, gait problem and joint swelling.  All other systems reviewed and are negative.   All other systems reviewed and are negative.  The following is a summary of the past history medically, past history surgically, known current medicines, social history and family history.  This information is gathered electronically by the computer from prior information and documentation.  I review this each visit and have found including this information at this point in the chart is beneficial and informative.    Past Medical History:  Diagnosis Date  . Arthritis    lower back  . Chronic back pain    Disc disease  . Chronic kidney disease   . Coronary atherosclerosis of native coronary artery     Coronary calcifications by chest CT, Myoview demonstrating inferolateral scar  . Deafness in right ear   . Diabetes mellitus   . Diabetic retinopathy ([place])    moderate nonproliferative with macular edema in right eye  . Diastolic dysfunction 04/6758   Grade 1.  . Essential hypertension, benign   . Heart murmur    in past  . HOH (hard of hearing)   . Hyperkalemia   . Kidney stones   . Mixed hyperlipidemia   . NAFLD (nonalcoholic fatty liver disease)   . Pancreatic insufficiency    Diagnosed at [facility]  . Prostate cancer ([place]) 2011  . Shortness of breath dyspnea    occasional - liver pressing on right lung, decreased capacity  . Subdural hematoma ([place])   . Type 2 diabetes mellitus ([place])   . Wears hearing aid    "crossover" form right to left    Past Surgical History:  Procedure Laterality Date  . APPENDECTOMY    . Bilateral hip replacement    . CATARACT EXTRACTION W/PHACO Left 05/15/2015   Procedure: CATARACT EXTRACTION PHACO AND INTRAOCULAR LENS PLACEMENT (IOC);  Surgeon: Ronnell Freshwater, MD;  Location: [place];  Service: Ophthalmology;  Laterality: Left;  DIABETIC - insulin pump and oral meds  . CATARACT EXTRACTION W/PHACO Right 08/28/2015   Procedure: CATARACT EXTRACTION PHACO AND INTRAOCULAR LENS PLACEMENT (IOC);  Surgeon: Ronnell Freshwater, MD;  Location: [place];  Service: Ophthalmology;  Laterality: Right;  DIABETIC PER PT AND CINDY PLEASE KEEP ARRIVAL TIME AFTER 8AM   . CHOLECYSTECTOMY    . HERNIA REPAIR    . PROSTATECTOMY  2011    Family History  Problem Relation Age of Onset  . Diabetes type II Mother   . Heart attack Father     Social History Social History   Tobacco Use  . Smoking status: Never Smoker  . Smokeless tobacco: Never Used  Substance Use Topics  . Alcohol use: No  . Drug use: No    Allergies  Allergen Reactions  . Enalapril Maleate Cough    Current Outpatient Medications  Medication Sig Dispense  Refill  . albuterol (PROVENTIL HFA;VENTOLIN HFA) 108 (90 Base) MCG/ACT inhaler Inhale 2 puffs into the lungs every 4 (four) hours as needed for wheezing or shortness of breath. 1 Inhaler 0  . aspirin EC 81 MG tablet Take 81 mg by mouth at bedtime.     . carvedilol (COREG) 25 MG tablet TAKE 1 TABLET(25 MG) BY MOUTH TWICE DAILY WITH A MEAL 180 tablet 3  . diazepam (VALIUM) 5 MG tablet TAKE 1 TABLET BY MOUTH TWICE DAILY AS NEEDED FOR ANXIETY 60 tablet 0  . doxazosin (CARDURA) 8 MG tablet TAKE 1 TABLET(8 MG) BY MOUTH DAILY 90 tablet 3  . furosemide (LASIX) 20 MG tablet Take 1.5 tablets (30 mg total) by mouth daily. 135 tablet 3  . insulin glargine (LANTUS) 100 UNIT/ML injection Inject 18 Units into the skin.     Marland Kitchen insulin regular (NOVOLIN R,HUMULIN R) 100 units/mL injection Inject 8 Units into the skin 3 (three) times daily before meals. 12u am  10u supper    . ketoconazole (NIZORAL) 2 % cream     . metFORMIN (GLUCOPHAGE-XR) 500 MG 24 hr tablet Take 1,000 mg by mouth 2 (two) times daily.    . Multiple Vitamin (MULTIVITAMIN WITH MINERALS) TABS Take 1 tablet by mouth daily.    . pravastatin (PRAVACHOL) 10 MG tablet TAKE 1 TABLET(10 MG) BY MOUTH DAILY WITH BREAKFAST 90 tablet 3  . valACYclovir (VALTREX) 1000 MG tablet Take 1 tablet (1,000 mg total) by mouth 3 (three) times daily. 21 tablet 0  . ZENPEP 10000-32000 units CPEP TAKE 1 CAPSULE BY MOUTH THREE TIMES DAILY BEFORE MEALS 270 capsule 0   No current facility-administered medications for this visit.     Physical Exam  Blood pressure 140/75, pulse 86, height 5' 8.5" (1.74 m), weight 194 lb (88 kg).  Constitutional: overall normal hygiene, normal nutrition, well developed, normal grooming, normal body habitus. Assistive device:cane  Musculoskeletal: gait and station Limp left, muscle tone and strength are normal, no tremors or atrophy is present.  .  Neurological: coordination overall normal.  Deep tendon reflex/nerve stretch intact.   Sensation normal.  Cranial nerves II-XII intact.   Skin:   Normal overall no scars, lesions, ulcers or rashes. No psoriasis.  Psychiatric: Alert and oriented x 3.  Recent memory intact, remote memory unclear.  Normal mood and affect. Well groomed.  Good eye contact.  Cardiovascular: overall no swelling, no varicosities, no edema bilaterally, normal temperatures of the legs and arms, no clubbing, cyanosis and good capillary refill.  Lymphatic: palpation is normal.  All other systems reviewed and are negative   The patient has been educated about the nature of the problem(s) and counseled on treatment options.  The patient appeared to understand what I have discussed and is in agreement with it.  Encounter Diagnosis  Name Primary?  Marland Kitchen  Back pain without radiation Yes    PLAN Call if any problems.  Precautions discussed.  Continue current medications.   Return to clinic prn   Electronically Signed Sanjuana Kava, MD 9/2/202111:34 AM

## 2020-04-18 ENCOUNTER — Encounter: Payer: Self-pay | Admitting: Family Medicine

## 2020-04-18 DIAGNOSIS — Z794 Long term (current) use of insulin: Secondary | ICD-10-CM | POA: Diagnosis not present

## 2020-04-18 DIAGNOSIS — E113293 Type 2 diabetes mellitus with mild nonproliferative diabetic retinopathy without macular edema, bilateral: Secondary | ICD-10-CM | POA: Diagnosis not present

## 2020-04-18 DIAGNOSIS — E1122 Type 2 diabetes mellitus with diabetic chronic kidney disease: Secondary | ICD-10-CM | POA: Diagnosis not present

## 2020-04-18 DIAGNOSIS — E1159 Type 2 diabetes mellitus with other circulatory complications: Secondary | ICD-10-CM | POA: Diagnosis not present

## 2020-04-18 DIAGNOSIS — N1832 Chronic kidney disease, stage 3b: Secondary | ICD-10-CM | POA: Diagnosis not present

## 2020-04-18 DIAGNOSIS — E1142 Type 2 diabetes mellitus with diabetic polyneuropathy: Secondary | ICD-10-CM | POA: Diagnosis not present

## 2020-04-19 ENCOUNTER — Other Ambulatory Visit: Payer: Self-pay | Admitting: Family Medicine

## 2020-04-19 NOTE — Telephone Encounter (Signed)
Requested Prescriptions   Pending Prescriptions Disp Refills  . diazepam (VALIUM) 5 MG tablet [Pharmacy Med Name: DIAZEPAM 5MG TABLETS] 60 tablet 0    Sig: TAKE 1 TABLET BY MOUTH TWICE DAILY AS NEEDED FOR ANXIETY     Last OV 01/07/2020   Last written 03/02/2020

## 2020-04-19 NOTE — Telephone Encounter (Signed)
Ok to refill??  Last office visit 01/07/2020.  Last refill 03/02/2020.  Ok to add refills to prescription?

## 2020-04-25 DIAGNOSIS — N1832 Chronic kidney disease, stage 3b: Secondary | ICD-10-CM | POA: Diagnosis not present

## 2020-04-26 ENCOUNTER — Other Ambulatory Visit: Payer: Self-pay | Admitting: Family Medicine

## 2020-04-28 ENCOUNTER — Ambulatory Visit: Payer: PPO

## 2020-05-01 DIAGNOSIS — E872 Acidosis: Secondary | ICD-10-CM | POA: Diagnosis not present

## 2020-05-01 DIAGNOSIS — N1832 Chronic kidney disease, stage 3b: Secondary | ICD-10-CM | POA: Diagnosis not present

## 2020-05-01 DIAGNOSIS — I129 Hypertensive chronic kidney disease with stage 1 through stage 4 chronic kidney disease, or unspecified chronic kidney disease: Secondary | ICD-10-CM | POA: Diagnosis not present

## 2020-05-01 DIAGNOSIS — N2581 Secondary hyperparathyroidism of renal origin: Secondary | ICD-10-CM | POA: Diagnosis not present

## 2020-05-01 DIAGNOSIS — E1122 Type 2 diabetes mellitus with diabetic chronic kidney disease: Secondary | ICD-10-CM | POA: Diagnosis not present

## 2020-05-02 ENCOUNTER — Encounter: Payer: Self-pay | Admitting: Podiatry

## 2020-05-02 ENCOUNTER — Ambulatory Visit: Payer: PPO | Admitting: Podiatry

## 2020-05-02 ENCOUNTER — Other Ambulatory Visit: Payer: Self-pay

## 2020-05-02 DIAGNOSIS — B351 Tinea unguium: Secondary | ICD-10-CM | POA: Diagnosis not present

## 2020-05-02 DIAGNOSIS — M79674 Pain in right toe(s): Secondary | ICD-10-CM

## 2020-05-02 DIAGNOSIS — L84 Corns and callosities: Secondary | ICD-10-CM | POA: Diagnosis not present

## 2020-05-02 DIAGNOSIS — E1142 Type 2 diabetes mellitus with diabetic polyneuropathy: Secondary | ICD-10-CM

## 2020-05-02 DIAGNOSIS — M79675 Pain in left toe(s): Secondary | ICD-10-CM | POA: Diagnosis not present

## 2020-05-02 DIAGNOSIS — I739 Peripheral vascular disease, unspecified: Secondary | ICD-10-CM

## 2020-05-04 NOTE — Progress Notes (Signed)
Subjective: Mark L Daigle Sr. presents today at risk foot care with history of peripheral neuropathy and painful callus(es) plantarly b/l and painful mycotic toenails b/l that are difficult to trim. Pain interferes with ambulation. Aggravating factors include wearing enclosed shoe gear. Pain is relieved with periodic professional debridement.  [name], MD is patient's PCP. Last visit was: 01/07/2020.  Blood sugar was 85 mg/dl this morning.  [name] voices no new pedal problems on today's visit.  Past Medical History:  Diagnosis Date  . Arthritis    lower back  . Chronic back pain    Disc disease  . Chronic kidney disease   . Coronary atherosclerosis of native coronary artery    Coronary calcifications by chest CT, Myoview demonstrating inferolateral scar  . Deafness in right ear   . Diabetes mellitus   . Diabetic retinopathy ([place])    moderate nonproliferative with macular edema in right eye  . Diastolic dysfunction 01/3784   Grade 1.  . Essential hypertension, benign   . Heart murmur    in past  . HOH (hard of hearing)   . Hyperkalemia   . Kidney stones   . Mixed hyperlipidemia   . NAFLD (nonalcoholic fatty liver disease)   . Pancreatic insufficiency    Diagnosed at [facility]  . Prostate cancer ([place]) 2011  . Shortness of breath dyspnea    occasional - liver pressing on right lung, decreased capacity  . Subdural hematoma ([place])   . Type 2 diabetes mellitus ([place])   . Wears hearing aid    "crossover" form right to left     Current Outpatient Medications on File Prior to Visit  Medication Sig Dispense Refill  . Continuous Blood Gluc Receiver (FREESTYLE LIBRE 2 READER) DEVI Use 1 Device as directed    . Continuous Blood Gluc Sensor (FREESTYLE LIBRE 2 SENSOR) MISC Use 1 kit every 14 (fourteen) days for glucose monitoring    . albuterol (PROVENTIL HFA;VENTOLIN HFA) 108 (90 Base) MCG/ACT inhaler Inhale 2 puffs into the lungs every 4 (four) hours as needed for wheezing or  shortness of breath. 1 Inhaler 0  . aspirin EC 81 MG tablet Take 81 mg by mouth at bedtime.     . carvedilol (COREG) 25 MG tablet TAKE 1 TABLET(25 MG) BY MOUTH TWICE DAILY WITH A MEAL 180 tablet 3  . Continuous Blood Gluc Receiver (FREESTYLE LIBRE 2 READER) DEVI USE TO MONITOR BLOOD SUGAR    . Continuous Blood Gluc Sensor (FREESTYLE LIBRE 2 SENSOR) MISC USE 1 KIT EVERY 14 DAYS FOR GLUCOSE MONITORING    . diazepam (VALIUM) 5 MG tablet TAKE 1 TABLET BY MOUTH TWICE DAILY AS NEEDED FOR ANXIETY 60 tablet 0  . doxazosin (CARDURA) 8 MG tablet TAKE 1 TABLET(8 MG) BY MOUTH DAILY 90 tablet 3  . furosemide (LASIX) 20 MG tablet Take 1.5 tablets (30 mg total) by mouth daily. 135 tablet 3  . insulin glargine (LANTUS) 100 UNIT/ML injection Inject 18 Units into the skin.     Marland Kitchen insulin regular (NOVOLIN R,HUMULIN R) 100 units/mL injection Inject 8 Units into the skin 3 (three) times daily before meals. 12u am  10u supper    . ketoconazole (NIZORAL) 2 % cream     . metFORMIN (GLUCOPHAGE-XR) 500 MG 24 hr tablet Take 1,000 mg by mouth 2 (two) times daily.    . Multiple Vitamin (MULTIVITAMIN WITH MINERALS) TABS Take 1 tablet by mouth daily.    . pravastatin (PRAVACHOL) 10 MG tablet TAKE 1 TABLET(10  MG) BY MOUTH DAILY WITH BREAKFAST 90 tablet 3  . valACYclovir (VALTREX) 1000 MG tablet Take 1 tablet (1,000 mg total) by mouth 3 (three) times daily. 21 tablet 0  . ZENPEP 10000-32000 units CPEP TAKE 1 CAPSULE BY MOUTH THREE TIMES DAILY BEFORE MEALS 270 capsule 0   No current facility-administered medications on file prior to visit.     Allergies  Allergen Reactions  . Enalapril Maleate Cough    Objective: Mark L Paladino Sr. is a pleasant 79 y.o. male obese in NAD. AAO x 3.  There were no vitals filed for this visit.  Vascular Examination: Capillary refill time to digits immediate b/l. Nonpalpable DP pulse(s) b/l lower extremities. Nonpalpable PT pulse(s) b/l lower extremities. Pedal hair absent. Lower  extremity skin temperature gradient within normal limits. No ischemia or gangrene noted b/l lower extremities.  Dermatological Examination: Pedal skin with normal turgor, texture and tone bilaterally. No open wounds bilaterally. No interdigital macerations bilaterally. Toenails 1-5 b/l elongated, discolored, dystrophic, thickened, crumbly with subungual debris and tenderness to dorsal palpation. Hyperkeratotic lesion(s) submet head 1 right foot and submet head 5 right foot.  No erythema, no edema, no drainage, no flocculence.  Musculoskeletal: Normal muscle strength 5/5 to all lower extremity muscle groups bilaterally. No pain crepitus or joint limitation noted with ROM b/l. Hammertoes noted to the L 5th toe and R 5th toe. Utilizes cane for ambulation assistance.  Neurological Examination: Protective sensation decreased with 10 gram monofilament b/l. Vibratory sensation decreased b/l. Proprioception intact bilaterally.  Last A1c: Hemoglobin A1C Latest Ref Rng & Units 07/20/2019  HGBA1C <5.7 % of total Hgb 7.3(H)  Some recent data might be hidden    Assessment: 1. Pain due to onychomycosis of toenails of both feet   2. Callus   3. PAD (peripheral artery disease) ([place])   4. Diabetic peripheral neuropathy associated with type 2 diabetes mellitus ([place])     Plan: -Examined patient. -No new findings. No new orders. -Continue diabetic foot care principles. -Toenails 1-5 b/l were debrided in length and girth with sterile nail nippers and dremel without iatrogenic bleeding.  -Callus(es) submet head 1 right foot and submet head 5 right foot pared utilizing sterile scalpel blade without complication or incident. Total number debrided =2. -Patient to report any pedal injuries to medical professional immediately. -Patient to continue soft, supportive shoe gear daily. -Patient/POA to call should there be question/concern in the interim.  Return in about 3 months (around 08/01/2020).  Marzetta Board, DPM

## 2020-05-05 ENCOUNTER — Other Ambulatory Visit: Payer: Self-pay | Admitting: Family Medicine

## 2020-05-05 DIAGNOSIS — K861 Other chronic pancreatitis: Secondary | ICD-10-CM

## 2020-05-08 DIAGNOSIS — E113313 Type 2 diabetes mellitus with moderate nonproliferative diabetic retinopathy with macular edema, bilateral: Secondary | ICD-10-CM | POA: Diagnosis not present

## 2020-05-10 ENCOUNTER — Other Ambulatory Visit: Payer: Self-pay | Admitting: Family Medicine

## 2020-05-10 DIAGNOSIS — K861 Other chronic pancreatitis: Secondary | ICD-10-CM

## 2020-05-17 ENCOUNTER — Encounter: Payer: Self-pay | Admitting: Family Medicine

## 2020-05-18 ENCOUNTER — Other Ambulatory Visit: Payer: Self-pay | Admitting: Family Medicine

## 2020-05-18 ENCOUNTER — Ambulatory Visit: Payer: PPO

## 2020-05-25 ENCOUNTER — Ambulatory Visit: Payer: PPO | Attending: Internal Medicine

## 2020-05-25 DIAGNOSIS — Z23 Encounter for immunization: Secondary | ICD-10-CM

## 2020-05-25 NOTE — Progress Notes (Signed)
   Covid-19 Vaccination Clinic  Name:  Mark DILIBERTO Sr.    MRN: [id number] DOB: 12-06-1940  05/25/2020  [name] was observed post Covid-19 immunization for 15 minutes without incident. He was provided with Vaccine Information Sheet and instruction to access the V-Safe system.   [name] was instructed to call 911 with any severe reactions post vaccine: Marland Kitchen Difficulty breathing  . Swelling of face and throat  . A fast heartbeat  . A bad rash all over body  . Dizziness and weakness

## 2020-06-05 DIAGNOSIS — E113293 Type 2 diabetes mellitus with mild nonproliferative diabetic retinopathy without macular edema, bilateral: Secondary | ICD-10-CM | POA: Diagnosis not present

## 2020-06-05 DIAGNOSIS — E11649 Type 2 diabetes mellitus with hypoglycemia without coma: Secondary | ICD-10-CM | POA: Diagnosis not present

## 2020-06-05 DIAGNOSIS — E1142 Type 2 diabetes mellitus with diabetic polyneuropathy: Secondary | ICD-10-CM | POA: Diagnosis not present

## 2020-06-05 DIAGNOSIS — E1122 Type 2 diabetes mellitus with diabetic chronic kidney disease: Secondary | ICD-10-CM | POA: Diagnosis not present

## 2020-06-05 DIAGNOSIS — Z794 Long term (current) use of insulin: Secondary | ICD-10-CM | POA: Diagnosis not present

## 2020-06-05 DIAGNOSIS — E1159 Type 2 diabetes mellitus with other circulatory complications: Secondary | ICD-10-CM | POA: Diagnosis not present

## 2020-06-05 DIAGNOSIS — N1832 Chronic kidney disease, stage 3b: Secondary | ICD-10-CM | POA: Diagnosis not present

## 2020-06-11 ENCOUNTER — Other Ambulatory Visit: Payer: Self-pay | Admitting: Family Medicine

## 2020-06-12 NOTE — Telephone Encounter (Signed)
Please Advise

## 2020-06-16 ENCOUNTER — Telehealth: Payer: Self-pay | Admitting: Family Medicine

## 2020-06-16 NOTE — Progress Notes (Signed)
  Chronic Care Management   Note  06/16/2020 Name: Mark KAMINSKY Sr. MRN: [id number] DOB: 1941/06/02  Mark Jumbo Sr. is a 79 y.o. year old male who is a primary care patient of Pickard, Cammie Mcgee, MD. I reached out to [place]. by phone today in response to a referral sent by [name]'s PCP, [name], MD.   [name] was given information about Chronic Care Management services today including:  1. CCM service includes personalized support from designated clinical staff supervised by his physician, including individualized plan of care and coordination with other care providers 2. 24/7 contact phone numbers for assistance for urgent and routine care needs. 3. Service will only be billed when office clinical staff spend 20 minutes or more in a month to coordinate care. 4. Only one practitioner may furnish and bill the service in a calendar month. 5. The patient may stop CCM services at any time (effective at the end of the month) by phone call to the office staff.   Patient agreed to services and verbal consent obtained.   Follow up plan:   Carley Perdue [employer] Scheduler

## 2020-06-22 ENCOUNTER — Ambulatory Visit: Payer: PPO

## 2020-07-04 DIAGNOSIS — E113313 Type 2 diabetes mellitus with moderate nonproliferative diabetic retinopathy with macular edema, bilateral: Secondary | ICD-10-CM | POA: Diagnosis not present

## 2020-07-25 ENCOUNTER — Other Ambulatory Visit: Payer: Self-pay | Admitting: Family Medicine

## 2020-08-02 DIAGNOSIS — E1142 Type 2 diabetes mellitus with diabetic polyneuropathy: Secondary | ICD-10-CM | POA: Diagnosis not present

## 2020-08-02 DIAGNOSIS — E1159 Type 2 diabetes mellitus with other circulatory complications: Secondary | ICD-10-CM | POA: Diagnosis not present

## 2020-08-02 DIAGNOSIS — E113293 Type 2 diabetes mellitus with mild nonproliferative diabetic retinopathy without macular edema, bilateral: Secondary | ICD-10-CM | POA: Diagnosis not present

## 2020-08-02 DIAGNOSIS — Z794 Long term (current) use of insulin: Secondary | ICD-10-CM | POA: Diagnosis not present

## 2020-08-02 DIAGNOSIS — E1122 Type 2 diabetes mellitus with diabetic chronic kidney disease: Secondary | ICD-10-CM | POA: Diagnosis not present

## 2020-08-02 DIAGNOSIS — I10 Essential (primary) hypertension: Secondary | ICD-10-CM | POA: Diagnosis not present

## 2020-08-02 DIAGNOSIS — N1832 Chronic kidney disease, stage 3b: Secondary | ICD-10-CM | POA: Diagnosis not present

## 2020-08-07 ENCOUNTER — Other Ambulatory Visit: Payer: Self-pay

## 2020-08-07 ENCOUNTER — Ambulatory Visit (INDEPENDENT_AMBULATORY_CARE_PROVIDER_SITE_OTHER): Payer: PPO | Admitting: Podiatry

## 2020-08-07 ENCOUNTER — Encounter: Payer: Self-pay | Admitting: Podiatry

## 2020-08-07 DIAGNOSIS — B351 Tinea unguium: Secondary | ICD-10-CM

## 2020-08-07 DIAGNOSIS — M79675 Pain in left toe(s): Secondary | ICD-10-CM | POA: Diagnosis not present

## 2020-08-07 DIAGNOSIS — E1142 Type 2 diabetes mellitus with diabetic polyneuropathy: Secondary | ICD-10-CM

## 2020-08-07 DIAGNOSIS — I739 Peripheral vascular disease, unspecified: Secondary | ICD-10-CM | POA: Diagnosis not present

## 2020-08-07 DIAGNOSIS — L84 Corns and callosities: Secondary | ICD-10-CM | POA: Diagnosis not present

## 2020-08-07 DIAGNOSIS — M79674 Pain in right toe(s): Secondary | ICD-10-CM | POA: Diagnosis not present

## 2020-08-10 ENCOUNTER — Other Ambulatory Visit: Payer: PPO

## 2020-08-10 DIAGNOSIS — Z20822 Contact with and (suspected) exposure to covid-19: Secondary | ICD-10-CM

## 2020-08-10 NOTE — Progress Notes (Signed)
Subjective: Mark L Gilreath Sr. presents today at risk foot care with history of peripheral neuropathy and painful callus(es) plantarly b/l and painful mycotic toenails b/l that are difficult to trim. Pain interferes with ambulation. Aggravating factors include wearing enclosed shoe gear. Pain is relieved with periodic professional debridement.   His wife is present during today's visit.   [name], MD is patient's PCP. Last visit was: 01/07/2020.  Mr. Souder voices no new pedal problems on today's visit.  Past Medical History:  Diagnosis Date  . Arthritis    lower back  . Chronic back pain    Disc disease  . Chronic kidney disease   . Coronary atherosclerosis of native coronary artery    Coronary calcifications by chest CT, Myoview demonstrating inferolateral scar  . Deafness in right ear   . Diabetes mellitus   . Diabetic retinopathy ([place])    moderate nonproliferative with macular edema in right eye  . Diastolic dysfunction 0/7622   Grade 1.  . Essential hypertension, benign   . Heart murmur    in past  . HOH (hard of hearing)   . Hyperkalemia   . Kidney stones   . Mixed hyperlipidemia   . NAFLD (nonalcoholic fatty liver disease)   . Pancreatic insufficiency    Diagnosed at [facility]  . Prostate cancer ([place]) 2011  . Shortness of breath dyspnea    occasional - liver pressing on right lung, decreased capacity  . Subdural hematoma ([place])   . Type 2 diabetes mellitus ([place])   . Wears hearing aid    "crossover" form right to left     Current Outpatient Medications on File Prior to Visit  Medication Sig Dispense Refill  . albuterol (PROVENTIL HFA;VENTOLIN HFA) 108 (90 Base) MCG/ACT inhaler Inhale 2 puffs into the lungs every 4 (four) hours as needed for wheezing or shortness of breath. 1 Inhaler 0  . aspirin EC 81 MG tablet Take 81 mg by mouth at bedtime.     . carvedilol (COREG) 25 MG tablet TAKE 1 TABLET(25 MG) BY MOUTH TWICE DAILY WITH A MEAL 180 tablet 3  .  Continuous Blood Gluc Receiver (FREESTYLE LIBRE 2 READER) DEVI Use 1 Device as directed    . Continuous Blood Gluc Receiver (FREESTYLE LIBRE 2 READER) DEVI USE TO MONITOR BLOOD SUGAR    . Continuous Blood Gluc Sensor (FREESTYLE LIBRE 2 SENSOR) MISC Use 1 kit every 14 (fourteen) days for glucose monitoring    . Continuous Blood Gluc Sensor (FREESTYLE LIBRE 2 SENSOR) MISC USE 1 KIT EVERY 14 DAYS FOR GLUCOSE MONITORING    . diazepam (VALIUM) 5 MG tablet TAKE 1 TABLET BY MOUTH TWICE DAILY AS NEEDED FOR ANXIETY 60 tablet 0  . doxazosin (CARDURA) 8 MG tablet TAKE 1 TABLET(8 MG) BY MOUTH DAILY 90 tablet 3  . FLUZONE HIGH-DOSE QUADRIVALENT 0.7 ML SUSY     . furosemide (LASIX) 20 MG tablet TAKE 1 AND 1/2 TABLETS(30 MG) BY MOUTH DAILY 135 tablet 3  . insulin regular (NOVOLIN R,HUMULIN R) 100 units/mL injection Inject 8 Units into the skin 3 (three) times daily before meals. 12u am  10u supper    . ketoconazole (NIZORAL) 2 % cream     . metFORMIN (GLUCOPHAGE-XR) 500 MG 24 hr tablet Take 1,000 mg by mouth 2 (two) times daily.    . Multiple Vitamin (MULTIVITAMIN WITH MINERALS) TABS Take 1 tablet by mouth daily.    . pravastatin (PRAVACHOL) 10 MG tablet TAKE 1 TABLET(10 MG) BY MOUTH  DAILY WITH BREAKFAST 90 tablet 3  . valACYclovir (VALTREX) 1000 MG tablet Take 1 tablet (1,000 mg total) by mouth 3 (three) times daily. 21 tablet 0  . ZENPEP 10000-32000 units CPEP TAKE 1 CAPSULE BY MOUTH THREE TIMES DAILY BEFORE MEALS 270 capsule 2   No current facility-administered medications on file prior to visit.     Allergies  Allergen Reactions  . Enalapril Maleate Cough    Objective: Derico L Reaney Sr. is a pleasant 80 y.o. male obese in NAD. AAO x 3.  There were no vitals filed for this visit.  Vascular Examination: Capillary refill time to digits immediate b/l. Nonpalpable DP pulse(s) b/l lower extremities. Nonpalpable PT pulse(s) b/l lower extremities. Pedal hair absent. Lower extremity skin temperature  gradient within normal limits. No ischemia or gangrene noted b/l lower extremities.  Dermatological Examination: Pedal skin with normal turgor, texture and tone bilaterally. No open wounds bilaterally. No interdigital macerations bilaterally. Toenails 1-5 b/l elongated, discolored, dystrophic, thickened, crumbly with subungual debris and tenderness to dorsal palpation. Hyperkeratotic lesion(s) submet head 1 right foot and submet head 5 right foot.  No erythema, no edema, no drainage, no flocculence.  Musculoskeletal: Normal muscle strength 5/5 to all lower extremity muscle groups bilaterally. No pain crepitus or joint limitation noted with ROM b/l. Hammertoes noted to the L 5th toe and R 5th toe. Utilizes cane for ambulation assistance.  Neurological Examination: Protective sensation decreased with 10 gram monofilament b/l. Vibratory sensation decreased b/l. Proprioception intact bilaterally.  Last A1c: No flowsheet data found.  Assessment: 1. Pain due to onychomycosis of toenails of both feet   2. Callus   3. PAD (peripheral artery disease) (H[facility])   4. Diabetic peripheral neuropathy associated with type 2 diabetes mellitus ([facility])     Plan: -Examined patient. -No new findings. No new orders. -Continue diabetic foot care principles. -Toenails 1-5 b/l were debrided in length and girth with sterile nail nippers and dremel without iatrogenic bleeding.  -Callus(es) submet head 1 right foot and submet head 5 right foot pared utilizing sterile scalpel blade without complication or incident. Total number debrided =2. -Patient to report any pedal injuries to medical professional immediately. -Patient to continue soft, supportive shoe gear daily. -Patient/POA to call should there be question/concern in the interim.  Return in about 3 months (around 11/05/2020).  Jennifer L [place]alaway, DPM 

## 2020-08-12 LAB — NOVEL CORONAVIRUS, NAA: SARS-CoV-2, NAA: NOT DETECTED

## 2020-08-12 LAB — SARS-COV-2, NAA 2 DAY TAT

## 2020-08-15 DIAGNOSIS — E113512 Type 2 diabetes mellitus with proliferative diabetic retinopathy with macular edema, left eye: Secondary | ICD-10-CM | POA: Diagnosis not present

## 2020-09-09 ENCOUNTER — Other Ambulatory Visit: Payer: Self-pay

## 2020-09-09 ENCOUNTER — Encounter: Payer: Self-pay | Admitting: Emergency Medicine

## 2020-09-09 ENCOUNTER — Ambulatory Visit
Admission: EM | Admit: 2020-09-09 | Discharge: 2020-09-09 | Disposition: A | Discharge status: Home / Self Care | Payer: PPO | Attending: Family Medicine | Admitting: Family Medicine

## 2020-09-09 DIAGNOSIS — M7712 Lateral epicondylitis, left elbow: Secondary | ICD-10-CM

## 2020-09-09 DIAGNOSIS — M25522 Pain in left elbow: Secondary | ICD-10-CM | POA: Diagnosis not present

## 2020-09-09 MED ORDER — PREDNISONE 10 MG PO TABS: 20.0000 mg | ORAL_TABLET | Freq: Every day | ORAL | 0 refills | Status: AC

## 2020-09-09 MED ORDER — TRAMADOL HCL 50 MG PO TABS: 50.0000 mg | ORAL_TABLET | Freq: Four times a day (QID) | ORAL | 0 refills | Status: DC | PRN

## 2020-09-09 NOTE — Discharge Instructions (Addendum)
I have sent in prednisone for you to take 75m one daily for 5 days  I have sent in tramadol for you to take one tablet every 6 hours as needed for pain  Follow up with this office or with primary care if symptoms are persisting.  Follow up in the ER for high fever, trouble swallowing, trouble breathing, other concerning symptoms.

## 2020-09-09 NOTE — ED Triage Notes (Signed)
Elbow started hurting last night. History of gout.  Joint is not red or warm .  No known injury.

## 2020-09-09 NOTE — ED Provider Notes (Signed)
[name]   [id number] 09/09/20 Arrival Time: 1022  BZ:JIRCV PAIN  SUBJECTIVE: History from: patient. Mark L Wherley Sr. is a 80 y.o. male complains of left elbow pain since last night. States that the pain reminds him of gout but that the area is not warm and red. Has not attempted OTC treatment for this. Denies a precipitating event or specific injury. Describes the pain as intermittent and achy in character. Symptoms are made worse with activity. Denies similar symptoms in the past. Denies fever, chills, erythema, ecchymosis, effusion, weakness, numbness and tingling, saddle paresthesias, loss of bowel or bladder function.      ROS: As per HPI.  All other pertinent ROS negative.     Past Medical History:  Diagnosis Date  . Arthritis    lower back  . Chronic back pain    Disc disease  . Chronic kidney disease   . Coronary atherosclerosis of native coronary artery    Coronary calcifications by chest CT, Myoview demonstrating inferolateral scar  . Deafness in right ear   . Diabetes mellitus   . Diabetic retinopathy ([place])    moderate nonproliferative with macular edema in right eye  . Diastolic dysfunction 03/9380   Grade 1.  . Essential hypertension, benign   . Heart murmur    in past  . HOH (hard of hearing)   . Hyperkalemia   . Kidney stones   . Mixed hyperlipidemia   . NAFLD (nonalcoholic fatty liver disease)   . Pancreatic insufficiency    Diagnosed at [facility]  . Prostate cancer ([place]) 2011  . Shortness of breath dyspnea    occasional - liver pressing on right lung, decreased capacity  . Subdural hematoma ([place])   . Type 2 diabetes mellitus ([place])   . Wears hearing aid    "crossover" form right to left   Past Surgical History:  Procedure Laterality Date  . APPENDECTOMY    . Bilateral hip replacement    . CATARACT EXTRACTION W/PHACO Left 05/15/2015   Procedure: CATARACT EXTRACTION PHACO AND INTRAOCULAR LENS PLACEMENT (IOC);  Surgeon: Ronnell Freshwater, MD;  Location: [facility];  Service: Ophthalmology;  Laterality: Left;  DIABETIC - insulin pump and oral meds  . CATARACT EXTRACTION W/PHACO Right 08/28/2015   Procedure: CATARACT EXTRACTION PHACO AND INTRAOCULAR LENS PLACEMENT (IOC);  Surgeon: Ronnell Freshwater, MD;  Location: [facility];  Service: Ophthalmology;  Laterality: Right;  DIABETIC PER PT AND CINDY PLEASE KEEP ARRIVAL TIME AFTER 8AM   . CHOLECYSTECTOMY    . HERNIA REPAIR    . PROSTATECTOMY  2011   Allergies  Allergen Reactions  . Enalapril Maleate Cough   No current facility-administered medications on file prior to encounter.   Current Outpatient Medications on File Prior to Encounter  Medication Sig Dispense Refill  . albuterol (PROVENTIL HFA;VENTOLIN HFA) 108 (90 Base) MCG/ACT inhaler Inhale 2 puffs into the lungs every 4 (four) hours as needed for wheezing or shortness of breath. 1 Inhaler 0  . aspirin EC 81 MG tablet Take 81 mg by mouth at bedtime.     . carvedilol (COREG) 25 MG tablet TAKE 1 TABLET(25 MG) BY MOUTH TWICE DAILY WITH A MEAL 180 tablet 3  . Continuous Blood Gluc Receiver (FREESTYLE LIBRE 2 READER) DEVI Use 1 Device as directed    . Continuous Blood Gluc Receiver (FREESTYLE LIBRE 2 READER) DEVI USE TO MONITOR BLOOD SUGAR    . Continuous Blood Gluc Sensor (FREESTYLE LIBRE 2 SENSOR) MISC Use  1 kit every 14 (fourteen) days for glucose monitoring    . Continuous Blood Gluc Sensor (FREESTYLE LIBRE 2 SENSOR) MISC USE 1 KIT EVERY 14 DAYS FOR GLUCOSE MONITORING    . diazepam (VALIUM) 5 MG tablet TAKE 1 TABLET BY MOUTH TWICE DAILY AS NEEDED FOR ANXIETY 60 tablet 0  . doxazosin (CARDURA) 8 MG tablet TAKE 1 TABLET(8 MG) BY MOUTH DAILY 90 tablet 3  . FLUZONE HIGH-DOSE QUADRIVALENT 0.7 ML SUSY     . furosemide (LASIX) 20 MG tablet TAKE 1 AND 1/2 TABLETS(30 MG) BY MOUTH DAILY 135 tablet 3  . insulin regular (NOVOLIN R,HUMULIN R) 100 units/mL injection Inject 8 Units into the skin 3  (three) times daily before meals. 12u am  10u supper    . ketoconazole (NIZORAL) 2 % cream     . metFORMIN (GLUCOPHAGE-XR) 500 MG 24 hr tablet Take 1,000 mg by mouth 2 (two) times daily.    . Multiple Vitamin (MULTIVITAMIN WITH MINERALS) TABS Take 1 tablet by mouth daily.    . pravastatin (PRAVACHOL) 10 MG tablet TAKE 1 TABLET(10 MG) BY MOUTH DAILY WITH BREAKFAST 90 tablet 3  . valACYclovir (VALTREX) 1000 MG tablet Take 1 tablet (1,000 mg total) by mouth 3 (three) times daily. 21 tablet 0  . ZENPEP 10000-32000 units CPEP TAKE 1 CAPSULE BY MOUTH THREE TIMES DAILY BEFORE MEALS 270 capsule 2   Social History   Socioeconomic History  . Marital status: Married    Spouse name: Not on file  . Number of children: Not on file  . Years of education: Not on file  . Highest education level: Not on file  Occupational History  . Not on file  Tobacco Use  . Smoking status: Never Smoker  . Smokeless tobacco: Never Used  Substance and Sexual Activity  . Alcohol use: No  . Drug use: No  . Sexual activity: Not on file  Other Topics Concern  . Not on file  Social History Narrative  . Not on file   Social Determinants of Health   Financial Resource Strain: Not on file  Food Insecurity: Not on file  Transportation Needs: Not on file  Physical Activity: Not on file  Stress: Not on file  Social Connections: Not on file  Intimate Partner Violence: Not on file   Family History  Problem Relation Age of Onset  . Diabetes type II Mother   . Heart attack Father     OBJECTIVE:  Vitals:   09/09/20 1050  BP: 126/66  Pulse: 80  Resp: 16  Temp: 98.6 F (37 C)  TempSrc: Oral  SpO2: 95%    General appearance: ALERT; in no acute distress.  Head: NCAT Lungs: Normal respiratory effort CV: pulses 2+ bilaterally. Cap refill < 2 seconds Musculoskeletal:  Inspection: Skin warm, dry, clear and intact No erythema, effusion to L elbow Palpation: tenderness to L lateral epicondyle tender to  palpation ROM: Limited ROM active and passive to L elbow Skin: warm and dry Neurologic: Ambulates without difficulty; Sensation intact about the upper/ lower extremities Psychological: alert and cooperative; normal mood and affect  DIAGNOSTIC STUDIES:  No results found.   ASSESSMENT & PLAN:  1. Left lateral epicondylitis   2. Left elbow pain     Meds ordered this encounter  Medications  . predniSONE (DELTASONE) 10 MG tablet    Sig: Take 2 tablets (20 mg total) by mouth daily for 5 days.    Dispense:  10 tablet    Refill:  0    Order Specific Question:   Supervising Provider    Answer:   Chase Picket [id number]  . traMADol (ULTRAM) 50 MG tablet    Sig: Take 1 tablet (50 mg total) by mouth every 6 (six) hours as needed.    Dispense:  15 tablet    Refill:  0    Order Specific Question:   Supervising Provider    Answer:   Chase Picket [id number]   Prescribed steroids Prescribed tramadol for breakthrough pain when tylenol is not enough Likely tendonitis Continue conservative management of rest, ice, and gentle stretches Follow up with PCP if symptoms persist Return or go to the ER if you have any new or worsening symptoms (fever, chills, chest pain, abdominal pain, changes in bowel or bladder habits, pain radiating into lower legs)   [place] Controlled Substances Registry consulted for this patient. I feel the risk/benefit ratio today is favorable for proceeding with this prescription for a controlled substance. Medication sedation precautions given.  Reviewed expectations re: course of current medical issues. Questions answered. Outlined signs and symptoms indicating need for more acute intervention. Patient verbalized understanding. After Visit Summary given.       Faustino Congress, NP 09/09/20 1226

## 2020-09-15 ENCOUNTER — Other Ambulatory Visit: Payer: Self-pay | Admitting: Family Medicine

## 2020-10-09 DIAGNOSIS — E113512 Type 2 diabetes mellitus with proliferative diabetic retinopathy with macular edema, left eye: Secondary | ICD-10-CM | POA: Diagnosis not present

## 2020-10-18 DIAGNOSIS — Z794 Long term (current) use of insulin: Secondary | ICD-10-CM | POA: Diagnosis not present

## 2020-10-18 DIAGNOSIS — N1832 Chronic kidney disease, stage 3b: Secondary | ICD-10-CM | POA: Diagnosis not present

## 2020-10-18 DIAGNOSIS — E1129 Type 2 diabetes mellitus with other diabetic kidney complication: Secondary | ICD-10-CM | POA: Diagnosis not present

## 2020-10-23 DIAGNOSIS — E1122 Type 2 diabetes mellitus with diabetic chronic kidney disease: Secondary | ICD-10-CM | POA: Diagnosis not present

## 2020-10-23 DIAGNOSIS — I129 Hypertensive chronic kidney disease with stage 1 through stage 4 chronic kidney disease, or unspecified chronic kidney disease: Secondary | ICD-10-CM | POA: Diagnosis not present

## 2020-10-23 DIAGNOSIS — N1832 Chronic kidney disease, stage 3b: Secondary | ICD-10-CM | POA: Diagnosis not present

## 2020-10-23 DIAGNOSIS — N2581 Secondary hyperparathyroidism of renal origin: Secondary | ICD-10-CM | POA: Diagnosis not present

## 2020-10-23 DIAGNOSIS — E872 Acidosis: Secondary | ICD-10-CM | POA: Diagnosis not present

## 2020-10-23 DIAGNOSIS — E559 Vitamin D deficiency, unspecified: Secondary | ICD-10-CM | POA: Diagnosis not present

## 2020-10-24 ENCOUNTER — Other Ambulatory Visit: Payer: Self-pay | Admitting: Family Medicine

## 2020-10-24 NOTE — Telephone Encounter (Signed)
Due for ov.

## 2020-10-24 NOTE — Telephone Encounter (Signed)
Ok to refill??  Last office visit 01/07/2020.  Last refill 09/15/2020.

## 2020-10-24 NOTE — Telephone Encounter (Signed)
Letter sent.

## 2020-10-28 ENCOUNTER — Encounter: Payer: Self-pay | Admitting: Family Medicine

## 2020-11-02 ENCOUNTER — Other Ambulatory Visit: Payer: Self-pay

## 2020-11-02 ENCOUNTER — Encounter: Payer: Self-pay | Admitting: Family Medicine

## 2020-11-02 ENCOUNTER — Ambulatory Visit (INDEPENDENT_AMBULATORY_CARE_PROVIDER_SITE_OTHER): Payer: PPO | Admitting: Family Medicine

## 2020-11-02 VITALS — BP 128/78 | HR 76 | Temp 98.6°F | Resp 14 | Ht 68.5 in | Wt 197.0 lb

## 2020-11-02 DIAGNOSIS — I10 Essential (primary) hypertension: Secondary | ICD-10-CM | POA: Diagnosis not present

## 2020-11-02 DIAGNOSIS — Z8546 Personal history of malignant neoplasm of prostate: Secondary | ICD-10-CM | POA: Diagnosis not present

## 2020-11-02 DIAGNOSIS — E1142 Type 2 diabetes mellitus with diabetic polyneuropathy: Secondary | ICD-10-CM | POA: Diagnosis not present

## 2020-11-02 DIAGNOSIS — N1832 Chronic kidney disease, stage 3b: Secondary | ICD-10-CM

## 2020-11-02 DIAGNOSIS — Z8679 Personal history of other diseases of the circulatory system: Secondary | ICD-10-CM

## 2020-11-02 NOTE — Progress Notes (Signed)
Subjective:    Patient ID: Mark Hose., male    DOB: February 28, 1941, 80 y.o.   MRN: [id number]  HPI  Patient is a very pleasant 80 year old Caucasian male here today just for a checkup.  He has a history of coronary artery disease, diabetes mellitus type 2 which is insulin-dependent, chronic kidney disease stage IIIb borderline stage IV, mild age-related cognitive decline, and chronic back pain secondary to spinal stenosis.  He recently had an MRI of the lumbar spine.  This showed degenerative disc disease greatest from L3-L4 to L5-S1 where there is compressive spinal stenosis which is moderate to advanced.  However the patient is able to tolerate the pain and therefore does not want any surgery performed.  He recently saw his nephrologist.  His vitamin D level was low at 11.7 and his PTH was elevated at 299.  Therefore they started him on 50,000 units of vitamin D twice a week and are planning to recheck his levels in 1 month.  His creatinine was elevated at 2.3 giving him a GFR of 28 which would now qualify stage IV.  He is regularly following up with his nephrologist and plans to see him again for repeat lab work in 1 month.  His hemoglobin was stable at 11.9.  He is seeing an endocrinologist who obtained lab work just recently in March.  His HDL cholesterol was 37.  His LDL cholesterol was 40.  His albumin to creatinine ratio was slightly elevated at 80.  However his A1c was 6.7 which is outstanding for this patient.  He denies any chest pain shortness of breath or dyspnea on exertion.  He is no longer taking gabapentin.  He is avoiding Benadryl.  He still takes Valium occasionally for sleep and anxiety.  He denies any deterioration in his memory or more confusion. Past Medical History:  Diagnosis Date  . Arthritis    lower back  . Chronic back pain    Disc disease  . Chronic kidney disease   . Coronary atherosclerosis of native coronary artery    Coronary calcifications by chest CT, Myoview  demonstrating inferolateral scar  . Deafness in right ear   . Diabetes mellitus   . Diabetic retinopathy ([place])    moderate nonproliferative with macular edema in right eye  . Diastolic dysfunction 01/2262   Grade 1.  . Essential hypertension, benign   . Heart murmur    in past  . HOH (hard of hearing)   . Hyperkalemia   . Kidney stones   . Mixed hyperlipidemia   . NAFLD (nonalcoholic fatty liver disease)   . Pancreatic insufficiency    Diagnosed at [facility]  . Prostate cancer ([place]) 2011  . Shortness of breath dyspnea    occasional - liver pressing on right lung, decreased capacity  . Subdural hematoma ([place])   . Type 2 diabetes mellitus ([place])   . Wears hearing aid    "crossover" form right to left   Past Surgical History:  Procedure Laterality Date  . APPENDECTOMY    . Bilateral hip replacement    . CATARACT EXTRACTION W/PHACO Left 05/15/2015   Procedure: CATARACT EXTRACTION PHACO AND INTRAOCULAR LENS PLACEMENT (IOC);  Surgeon: Ronnell Freshwater, MD;  Location: [place];  Service: Ophthalmology;  Laterality: Left;  DIABETIC - insulin pump and oral meds  . CATARACT EXTRACTION W/PHACO Right 08/28/2015   Procedure: CATARACT EXTRACTION PHACO AND INTRAOCULAR LENS PLACEMENT (IOC);  Surgeon: Ronnell Freshwater, MD;  Location: [facility];  Service: Ophthalmology;  Laterality: Right;  DIABETIC PER PT AND CINDY PLEASE KEEP ARRIVAL TIME AFTER 8AM   . CHOLECYSTECTOMY    . HERNIA REPAIR    . PROSTATECTOMY  2011   Current Outpatient Medications on File Prior to Visit  Medication Sig Dispense Refill  . albuterol (PROVENTIL HFA;VENTOLIN HFA) 108 (90 Base) MCG/ACT inhaler Inhale 2 puffs into the lungs every 4 (four) hours as needed for wheezing or shortness of breath. 1 Inhaler 0  . aspirin EC 81 MG tablet Take 81 mg by mouth at bedtime.     . carvedilol (COREG) 25 MG tablet TAKE 1 TABLET(25 MG) BY MOUTH TWICE DAILY WITH A MEAL 180 tablet 3  . Continuous Blood  Gluc Receiver (FREESTYLE LIBRE 2 READER) DEVI Use 1 Device as directed    . Continuous Blood Gluc Sensor (FREESTYLE LIBRE 2 SENSOR) MISC Use 1 kit every 14 (fourteen) days for glucose monitoring    . Continuous Blood Gluc Sensor (FREESTYLE LIBRE 2 SENSOR) MISC USE 1 KIT EVERY 14 DAYS FOR GLUCOSE MONITORING    . diazepam (VALIUM) 5 MG tablet TAKE 1 TABLET BY MOUTH TWICE DAILY AS NEEDED FOR ANXIETY 60 tablet 0  . doxazosin (CARDURA) 8 MG tablet TAKE 1 TABLET(8 MG) BY MOUTH DAILY 90 tablet 3  . furosemide (LASIX) 20 MG tablet TAKE 1 AND 1/2 TABLETS(30 MG) BY MOUTH DAILY 135 tablet 3  . insulin regular (NOVOLIN R,HUMULIN R) 100 units/mL injection Inject 8 Units into the skin 3 (three) times daily before meals. 12u am  10u supper    . ketoconazole (NIZORAL) 2 % cream     . Multiple Vitamin (MULTIVITAMIN WITH MINERALS) TABS Take 1 tablet by mouth daily.    . pravastatin (PRAVACHOL) 10 MG tablet TAKE 1 TABLET(10 MG) BY MOUTH DAILY WITH BREAKFAST 90 tablet 3  . valACYclovir (VALTREX) 1000 MG tablet Take 1 tablet (1,000 mg total) by mouth 3 (three) times daily. 21 tablet 0  . ZENPEP 10000-32000 units CPEP TAKE 1 CAPSULE BY MOUTH THREE TIMES DAILY BEFORE MEALS 270 capsule 2   No current facility-administered medications on file prior to visit.   Allergies  Allergen Reactions  . Enalapril Maleate Cough   Social History   Socioeconomic History  . Marital status: Married    Spouse name: Not on file  . Number of children: Not on file  . Years of education: Not on file  . Highest education level: Not on file  Occupational History  . Not on file  Tobacco Use  . Smoking status: Never Smoker  . Smokeless tobacco: Never Used  Substance and Sexual Activity  . Alcohol use: No  . Drug use: No  . Sexual activity: Not on file  Other Topics Concern  . Not on file  Social History Narrative  . Not on file   Social Determinants of Health   Financial Resource Strain: Not on file  Food Insecurity:  Not on file  Transportation Needs: Not on file  Physical Activity: Not on file  Stress: Not on file  Social Connections: Not on file  Intimate Partner Violence: Not on file      Review of Systems  All other systems reviewed and are negative.      Objective:   Physical Exam Vitals reviewed.  Constitutional:      Appearance: He is well-developed.  Neck:     Vascular: No JVD.  Cardiovascular:     Rate and Rhythm: Normal rate and regular rhythm.  Heart sounds: Normal heart sounds. No murmur heard.   Pulmonary:     Effort: Pulmonary effort is normal.     Breath sounds: Examination of the right-middle field reveals rales. Rales present. No decreased breath sounds, wheezing or rhonchi.  Abdominal:     General: Bowel sounds are normal. There is no distension.     Palpations: Abdomen is soft. There is no mass.     Tenderness: There is no abdominal tenderness. There is no guarding or rebound.  Musculoskeletal:     Cervical back: Neck supple.  Lymphadenopathy:     Cervical: No cervical adenopathy.  Neurological:     Mental Status: He is oriented to person, place, and time.     Cranial Nerves: No cranial nerve deficit.     Motor: No abnormal muscle tone.     Coordination: Coordination normal.     Deep Tendon Reflexes: Reflexes are normal and symmetric.  Psychiatric:        Speech: Speech normal. Speech is not slurred.        Behavior: Behavior normal. Behavior is not slowed.        Thought Content: Thought content normal.        Judgment: Judgment normal.           Assessment & Plan:  History of ASCVD  DM type 2 with diabetic peripheral neuropathy ([place]) - Plan: CBC with Differential/Platelet, COMPLETE METABOLIC PANEL WITH GFR, Lipid panel, Microalbumin, urine, Hemoglobin A1c  Stage 3b chronic kidney disease (HCC)  Benign essential HTN  History of prostate cancer  I am extremely happy with the patient's blood pressure today.  His A1c is outstanding for his  age.  His LDL cholesterol is well below his goal of 70.  His hemoglobin is acceptable at 11.9.  His vitamin D deficiency is being treated through his nephrology office.  Therefore I see no changes need to be made at the present time.  His age-related cognitive decline is stable.  I continue to encourage the patient to avoid any NSAIDs.  He can use Tylenol for his back pain.  Try to limit the Valium is much as possible due to his age-related cognitive decline.  He continues to report difficulty sleeping and I recommended he try melatonin over-the-counter but I would not recommend increasing substances that could cross the blood-brain barrier and affect his cognition

## 2020-11-03 ENCOUNTER — Ambulatory Visit: Payer: PPO | Admitting: Family Medicine

## 2020-11-06 ENCOUNTER — Other Ambulatory Visit: Payer: Self-pay | Admitting: *Deleted

## 2020-11-07 DIAGNOSIS — Z794 Long term (current) use of insulin: Secondary | ICD-10-CM | POA: Diagnosis not present

## 2020-11-07 DIAGNOSIS — N1832 Chronic kidney disease, stage 3b: Secondary | ICD-10-CM | POA: Diagnosis not present

## 2020-11-07 DIAGNOSIS — E1122 Type 2 diabetes mellitus with diabetic chronic kidney disease: Secondary | ICD-10-CM | POA: Diagnosis not present

## 2020-11-07 DIAGNOSIS — E1159 Type 2 diabetes mellitus with other circulatory complications: Secondary | ICD-10-CM | POA: Diagnosis not present

## 2020-11-07 DIAGNOSIS — I10 Essential (primary) hypertension: Secondary | ICD-10-CM | POA: Diagnosis not present

## 2020-11-07 DIAGNOSIS — E1142 Type 2 diabetes mellitus with diabetic polyneuropathy: Secondary | ICD-10-CM | POA: Diagnosis not present

## 2020-11-07 DIAGNOSIS — E113293 Type 2 diabetes mellitus with mild nonproliferative diabetic retinopathy without macular edema, bilateral: Secondary | ICD-10-CM | POA: Diagnosis not present

## 2020-11-08 ENCOUNTER — Ambulatory Visit: Payer: PPO | Admitting: Podiatry

## 2020-11-08 ENCOUNTER — Encounter: Payer: Self-pay | Admitting: Podiatry

## 2020-11-08 ENCOUNTER — Other Ambulatory Visit: Payer: Self-pay

## 2020-11-08 DIAGNOSIS — B353 Tinea pedis: Secondary | ICD-10-CM

## 2020-11-08 DIAGNOSIS — M79675 Pain in left toe(s): Secondary | ICD-10-CM | POA: Diagnosis not present

## 2020-11-08 DIAGNOSIS — L84 Corns and callosities: Secondary | ICD-10-CM | POA: Diagnosis not present

## 2020-11-08 DIAGNOSIS — M79674 Pain in right toe(s): Secondary | ICD-10-CM

## 2020-11-08 DIAGNOSIS — B351 Tinea unguium: Secondary | ICD-10-CM | POA: Diagnosis not present

## 2020-11-08 DIAGNOSIS — E1142 Type 2 diabetes mellitus with diabetic polyneuropathy: Secondary | ICD-10-CM | POA: Diagnosis not present

## 2020-11-08 MED ORDER — KETOCONAZOLE 2 % EX CREA: TOPICAL_CREAM | CUTANEOUS | 1 refills | Status: DC

## 2020-11-08 NOTE — Progress Notes (Signed)
Subjective: Mark L Fennema Sr. presents today at risk foot care with history of peripheral neuropathy and painful callus(es) plantarly b/l and painful mycotic toenails b/l that are difficult to trim. Pain interferes with ambulation. Aggravating factors include wearing enclosed shoe gear. Pain is relieved with periodic professional debridement.   His wife is present during today's visit. Blood glucose in office is 189 mg/dl.   Mark Frizzle, MD is patient's PCP. Last visit was: 11/02/2020.  [name] voices no new pedal problems on today's visit.  Allergies  Allergen Reactions  . Enalapril Maleate Cough    Objective: Mark L Venuti Sr. is a pleasant 80 y.o. male obese in NAD. AAO x 3.  There were no vitals filed for this visit.  Vascular Examination: Capillary refill time to digits immediate b/l. Nonpalpable DP pulse(s) b/l lower extremities. Nonpalpable PT pulse(s) b/l lower extremities. Pedal hair absent. Lower extremity skin temperature gradient within normal limits. No ischemia or gangrene noted b/l lower extremities.  Dermatological Examination: Pedal skin with normal turgor, texture and tone bilaterally. No open wounds bilaterally. No interdigital macerations bilaterally. Toenails 1-5 b/l elongated, discolored, dystrophic, thickened, crumbly with subungual debris and tenderness to dorsal palpation. Hyperkeratotic lesion(s) submet head 1 right foot and submet head 5 right foot.  No erythema, no edema, no drainage, no fluctuance. Diffuse scaling noted peripherally and plantarly b/l feet with mild foot odor.  No interdigital macerations.  No blisters, no weeping. No signs of secondary bacterial infection noted.  Musculoskeletal: Normal muscle strength 5/5 to all lower extremity muscle groups bilaterally. No pain crepitus or joint limitation noted with ROM b/l. Hammertoes noted to the L 5th toe and R 5th toe. Utilizes cane for ambulation assistance.  Neurological  Examination: Protective sensation decreased with 10 gram monofilament b/l. Vibratory sensation decreased b/l. Proprioception intact bilaterally.  Assessment: 1. Pain due to onychomycosis of toenails of both feet   2. Tinea pedis of both feet   3. Callus   4. Diabetic peripheral neuropathy associated with type 2 diabetes mellitus ([place])    Plan: -Examined patient. -No new findings. No new orders. -Continue diabetic foot care principles. -Toenails 1-5 b/l were debrided in length and girth with sterile nail nippers and dremel without iatrogenic bleeding.  -Callus(es) submet head 1 right foot and submet head 5 right foot pared utilizing sterile scalpel blade without complication or incident. Total number debrided =2. -Patient to report any pedal injuries to medical professional immediately. -Patient to continue soft, supportive shoe gear daily. -Patient/POA to call should there be question/concern in the interim.  Return in about 3 months (around 02/07/2021) for diabetic nail trim.  Marzetta Board, DPM

## 2020-11-22 DIAGNOSIS — N1832 Chronic kidney disease, stage 3b: Secondary | ICD-10-CM | POA: Diagnosis not present

## 2020-11-30 DIAGNOSIS — D224 Melanocytic nevi of scalp and neck: Secondary | ICD-10-CM | POA: Diagnosis not present

## 2020-11-30 DIAGNOSIS — L570 Actinic keratosis: Secondary | ICD-10-CM | POA: Diagnosis not present

## 2020-11-30 DIAGNOSIS — C44629 Squamous cell carcinoma of skin of left upper limb, including shoulder: Secondary | ICD-10-CM | POA: Diagnosis not present

## 2020-11-30 DIAGNOSIS — L820 Inflamed seborrheic keratosis: Secondary | ICD-10-CM | POA: Diagnosis not present

## 2020-11-30 DIAGNOSIS — X32XXXA Exposure to sunlight, initial encounter: Secondary | ICD-10-CM | POA: Diagnosis not present

## 2020-12-03 ENCOUNTER — Other Ambulatory Visit: Payer: Self-pay | Admitting: Family Medicine

## 2020-12-08 ENCOUNTER — Other Ambulatory Visit: Payer: Self-pay | Admitting: Family Medicine

## 2020-12-08 NOTE — Telephone Encounter (Signed)
Ok to refill??  Last office visit 11/02/2020.  Last refill 10/24/2020.  Ok to add refills to prescription?

## 2020-12-09 ENCOUNTER — Other Ambulatory Visit: Payer: Self-pay | Admitting: Family Medicine

## 2020-12-09 MED ORDER — NIRMATRELVIR/RITONAVIR (PAXLOVID)TABLET: 2.0000 | ORAL_TABLET | Freq: Two times a day (BID) | ORAL | 0 refills | Status: AC

## 2020-12-20 DIAGNOSIS — N1832 Chronic kidney disease, stage 3b: Secondary | ICD-10-CM | POA: Diagnosis not present

## 2020-12-21 ENCOUNTER — Ambulatory Visit (INDEPENDENT_AMBULATORY_CARE_PROVIDER_SITE_OTHER): Payer: PPO | Admitting: Family Medicine

## 2020-12-21 ENCOUNTER — Encounter: Payer: Self-pay | Admitting: Family Medicine

## 2020-12-21 ENCOUNTER — Other Ambulatory Visit: Payer: Self-pay

## 2020-12-21 VITALS — BP 130/60 | HR 86 | Temp 98.7°F | Resp 18 | Ht 68.5 in | Wt 192.0 lb

## 2020-12-21 DIAGNOSIS — U071 COVID-19: Secondary | ICD-10-CM | POA: Diagnosis not present

## 2020-12-21 DIAGNOSIS — E860 Dehydration: Secondary | ICD-10-CM | POA: Diagnosis not present

## 2020-12-21 DIAGNOSIS — R410 Disorientation, unspecified: Secondary | ICD-10-CM

## 2020-12-21 DIAGNOSIS — R5381 Other malaise: Secondary | ICD-10-CM | POA: Diagnosis not present

## 2020-12-21 NOTE — Progress Notes (Signed)
Subjective:    Patient ID: [name]., male    DOB: 1941/08/03, 80 y.o.   MRN: [id number]  HPI  I received a phone call on May 7.  Patient had been diagnosed with COVID.  Patient was started immediately on Paxlovid based on renal dosing.  He completed the 5 days.  He is here today with his wife.  Wife states that since he had COVID he is more confused.  He is more lethargic.  His blood sugars have been running higher in the 200s.  He is weaker.  He is having difficult time ambulating around the house.  Today, the patient is able to correctly tell me the date and location.  However he is slow to answer questions and appears somewhat confused.  He has had episodes of delirium in the past associated with pneumonia and also polypharmacy.  His wife insists that she is monitoring his medications and that he is not using his Valium.  However he has been taking allergy medication with Benadryl and Sudafed.  He clinically looks dehydrated.  He is also tachycardic at 100 bpm however his rhythm is regular.  He denies any fever or chills or chest pain or pleurisy or shortness of breath.  He denies any abdominal pain or nausea or vomiting.  He denies any dysuria or urgency or frequency.  He had lab work checked yesterday in his nephrology office which was reportedly stable. Past Medical History:  Diagnosis Date  . Arthritis    lower back  . Chronic back pain    Disc disease  . Chronic kidney disease   . Coronary atherosclerosis of native coronary artery    Coronary calcifications by chest CT, Myoview demonstrating inferolateral scar  . Deafness in right ear   . Diabetes mellitus   . Diabetic retinopathy ([place])    moderate nonproliferative with macular edema in right eye  . Diastolic dysfunction 11/2874   Grade 1.  . Essential hypertension, benign   . Heart murmur    in past  . HOH (hard of hearing)   . Hyperkalemia   . Kidney stones   . Mixed hyperlipidemia   . NAFLD (nonalcoholic fatty  liver disease)   . Pancreatic insufficiency    Diagnosed at [facility]  . Prostate cancer ([place]) 2011  . Shortness of breath dyspnea    occasional - liver pressing on right lung, decreased capacity  . Subdural hematoma ([place])   . Type 2 diabetes mellitus ([place])   . Wears hearing aid    "crossover" form right to left   Past Surgical History:  Procedure Laterality Date  . APPENDECTOMY    . Bilateral hip replacement    . CATARACT EXTRACTION W/PHACO Left 05/15/2015   Procedure: CATARACT EXTRACTION PHACO AND INTRAOCULAR LENS PLACEMENT (IOC);  Surgeon: Ronnell Freshwater, MD;  Location: [place];  Service: Ophthalmology;  Laterality: Left;  DIABETIC - insulin pump and oral meds  . CATARACT EXTRACTION W/PHACO Right 08/28/2015   Procedure: CATARACT EXTRACTION PHACO AND INTRAOCULAR LENS PLACEMENT (IOC);  Surgeon: Ronnell Freshwater, MD;  Location: [place];  Service: Ophthalmology;  Laterality: Right;  DIABETIC PER PT AND CINDY PLEASE KEEP ARRIVAL TIME AFTER 8AM   . CHOLECYSTECTOMY    . HERNIA REPAIR    . PROSTATECTOMY  2011   Current Outpatient Medications on File Prior to Visit  Medication Sig Dispense Refill  . albuterol (PROVENTIL HFA;VENTOLIN HFA) 108 (90 Base) MCG/ACT inhaler Inhale 2 puffs into the  lungs every 4 (four) hours as needed for wheezing or shortness of breath. 1 Inhaler 0  . aspirin EC 81 MG tablet Take 81 mg by mouth at bedtime.     . carvedilol (COREG) 25 MG tablet TAKE 1 TABLET(25 MG) BY MOUTH TWICE DAILY WITH A MEAL 180 tablet 3  . Continuous Blood Gluc Receiver (FREESTYLE LIBRE 2 READER) DEVI Use 1 Device as directed    . diazepam (VALIUM) 5 MG tablet TAKE 1 TABLET BY MOUTH TWICE DAILY AS NEEDED FOR ANXIETY 60 tablet 3  . doxazosin (CARDURA) 8 MG tablet TAKE 1 TABLET(8 MG) BY MOUTH DAILY 90 tablet 3  . ergocalciferol (VITAMIN D2) 1.25 MG (50000 UT) capsule Take by mouth.    . furosemide (LASIX) 20 MG tablet TAKE 1 AND 1/2 TABLETS(30 MG) BY  MOUTH DAILY 135 tablet 3  . insulin regular (NOVOLIN R,HUMULIN R) 100 units/mL injection Inject 8 Units into the skin 3 (three) times daily before meals. 12u am  10u supper    . ketoconazole (NIZORAL) 2 % cream Apply to both feet and between toes once daily for 4 weeks. 30 g 1  . Multiple Vitamin (MULTIVITAMIN WITH MINERALS) TABS Take 1 tablet by mouth daily.    . pravastatin (PRAVACHOL) 10 MG tablet TAKE 1 TABLET(10 MG) BY MOUTH DAILY WITH BREAKFAST 90 tablet 3  . valACYclovir (VALTREX) 1000 MG tablet Take 1 tablet (1,000 mg total) by mouth 3 (three) times daily. 21 tablet 0  . ZENPEP 10000-32000 units CPEP TAKE 1 CAPSULE BY MOUTH THREE TIMES DAILY BEFORE MEALS 270 capsule 2   No current facility-administered medications on file prior to visit.   Allergies  Allergen Reactions  . Enalapril Maleate Cough   Social History   Socioeconomic History  . Marital status: Married    Spouse name: Not on file  . Number of children: Not on file  . Years of education: Not on file  . Highest education level: Not on file  Occupational History  . Not on file  Tobacco Use  . Smoking status: Never Smoker  . Smokeless tobacco: Never Used  Substance and Sexual Activity  . Alcohol use: No  . Drug use: No  . Sexual activity: Not on file  Other Topics Concern  . Not on file  Social History Narrative  . Not on file   Social Determinants of Health   Financial Resource Strain: Not on file  Food Insecurity: Not on file  Transportation Needs: Not on file  Physical Activity: Not on file  Stress: Not on file  Social Connections: Not on file  Intimate Partner Violence: Not on file      Review of Systems  All other systems reviewed and are negative.      Objective:   Physical Exam Vitals reviewed.  Constitutional:      Appearance: He is well-developed.  Neck:     Vascular: No JVD.  Cardiovascular:     Rate and Rhythm: Normal rate and regular rhythm.     Heart sounds: Normal heart  sounds. No murmur heard.   Pulmonary:     Effort: Pulmonary effort is normal.     Breath sounds: Examination of the right-middle field reveals rales. Rales present. No decreased breath sounds, wheezing or rhonchi.  Abdominal:     General: Bowel sounds are normal. There is no distension.     Palpations: Abdomen is soft. There is no mass.     Tenderness: There is no abdominal tenderness. There is  no guarding or rebound.  Musculoskeletal:     Cervical back: Neck supple.  Lymphadenopathy:     Cervical: No cervical adenopathy.  Neurological:     Mental Status: He is oriented to person, place, and time.     Cranial Nerves: No cranial nerve deficit.     Motor: No abnormal muscle tone.     Coordination: Coordination normal.     Deep Tendon Reflexes: Reflexes are normal and symmetric.  Psychiatric:        Speech: Speech normal. Speech is not slurred.        Behavior: Behavior normal. Behavior is not slowed.        Thought Content: Thought content normal.        Judgment: Judgment normal.           Assessment & Plan:  COVID-19  Physical deconditioning  Dehydration  Confusion  Clinically there is no evidence of pneumonia.  Patient is not hypoxic.  He does look a little dehydrated.  He is also not quite at his normal baseline.  I believe he is a little confused.  I believe this is all secondary to the severity of his most recent infection.  Therefore have recommended that they hold his Lasix and encourage fluids to try to treat the dehydration.  I recommended that she hold Benadryl and Sudafed and Valium.  I anticipate that the patient's mental status will improve the further he is removed from [place] as I believe he has some mild delirium secondary to infection plus he also has some underlying age-related cognitive decline at his baseline.  Third I want to arrange home health nursing and physical therapy to come out and start working with the patient due to his deconditioning.  If the  situation worsens she will notify me immediately.  Otherwise recheck the patient in 1 week.

## 2020-12-23 ENCOUNTER — Encounter (HOSPITAL_COMMUNITY): Payer: Self-pay | Admitting: Emergency Medicine

## 2020-12-23 ENCOUNTER — Inpatient Hospital Stay (HOSPITAL_COMMUNITY)
Admission: EM | Admit: 2020-12-23 | Discharge: 2021-01-25 | DRG: 673 | Disposition: A | Discharge status: Skilled Nursing Facility | Payer: PPO | Attending: Internal Medicine | Admitting: Internal Medicine

## 2020-12-23 ENCOUNTER — Emergency Department (HOSPITAL_COMMUNITY): Payer: PPO

## 2020-12-23 ENCOUNTER — Other Ambulatory Visit: Payer: Self-pay

## 2020-12-23 DIAGNOSIS — D696 Thrombocytopenia, unspecified: Secondary | ICD-10-CM | POA: Diagnosis present

## 2020-12-23 DIAGNOSIS — E1122 Type 2 diabetes mellitus with diabetic chronic kidney disease: Secondary | ICD-10-CM | POA: Diagnosis present

## 2020-12-23 DIAGNOSIS — Z992 Dependence on renal dialysis: Secondary | ICD-10-CM

## 2020-12-23 DIAGNOSIS — K760 Fatty (change of) liver, not elsewhere classified: Secondary | ICD-10-CM | POA: Diagnosis not present

## 2020-12-23 DIAGNOSIS — Z87442 Personal history of urinary calculi: Secondary | ICD-10-CM

## 2020-12-23 DIAGNOSIS — I471 Supraventricular tachycardia, unspecified: Secondary | ICD-10-CM

## 2020-12-23 DIAGNOSIS — U071 COVID-19: Secondary | ICD-10-CM

## 2020-12-23 DIAGNOSIS — Z8249 Family history of ischemic heart disease and other diseases of the circulatory system: Secondary | ICD-10-CM

## 2020-12-23 DIAGNOSIS — Z7401 Bed confinement status: Secondary | ICD-10-CM | POA: Diagnosis not present

## 2020-12-23 DIAGNOSIS — I509 Heart failure, unspecified: Secondary | ICD-10-CM | POA: Diagnosis not present

## 2020-12-23 DIAGNOSIS — I4510 Unspecified right bundle-branch block: Secondary | ICD-10-CM | POA: Diagnosis present

## 2020-12-23 DIAGNOSIS — I2510 Atherosclerotic heart disease of native coronary artery without angina pectoris: Secondary | ICD-10-CM | POA: Diagnosis present

## 2020-12-23 DIAGNOSIS — I700 Atherosclerosis of aorta: Secondary | ICD-10-CM | POA: Diagnosis present

## 2020-12-23 DIAGNOSIS — J1282 Pneumonia due to coronavirus disease 2019: Secondary | ICD-10-CM | POA: Diagnosis not present

## 2020-12-23 DIAGNOSIS — I77810 Thoracic aortic ectasia: Secondary | ICD-10-CM | POA: Diagnosis not present

## 2020-12-23 DIAGNOSIS — E1129 Type 2 diabetes mellitus with other diabetic kidney complication: Secondary | ICD-10-CM | POA: Diagnosis not present

## 2020-12-23 DIAGNOSIS — I5032 Chronic diastolic (congestive) heart failure: Secondary | ICD-10-CM | POA: Diagnosis not present

## 2020-12-23 DIAGNOSIS — B029 Zoster without complications: Secondary | ICD-10-CM | POA: Diagnosis not present

## 2020-12-23 DIAGNOSIS — D631 Anemia in chronic kidney disease: Secondary | ICD-10-CM | POA: Diagnosis present

## 2020-12-23 DIAGNOSIS — I132 Hypertensive heart and chronic kidney disease with heart failure and with stage 5 chronic kidney disease, or end stage renal disease: Secondary | ICD-10-CM | POA: Diagnosis present

## 2020-12-23 DIAGNOSIS — Z961 Presence of intraocular lens: Secondary | ICD-10-CM | POA: Diagnosis present

## 2020-12-23 DIAGNOSIS — Z7982 Long term (current) use of aspirin: Secondary | ICD-10-CM

## 2020-12-23 DIAGNOSIS — H9191 Unspecified hearing loss, right ear: Secondary | ICD-10-CM | POA: Diagnosis present

## 2020-12-23 DIAGNOSIS — G9341 Metabolic encephalopathy: Secondary | ICD-10-CM | POA: Diagnosis not present

## 2020-12-23 DIAGNOSIS — E876 Hypokalemia: Secondary | ICD-10-CM | POA: Diagnosis not present

## 2020-12-23 DIAGNOSIS — E872 Acidosis, unspecified: Secondary | ICD-10-CM

## 2020-12-23 DIAGNOSIS — N170 Acute kidney failure with tubular necrosis: Principal | ICD-10-CM | POA: Diagnosis present

## 2020-12-23 DIAGNOSIS — I472 Ventricular tachycardia: Secondary | ICD-10-CM | POA: Diagnosis present

## 2020-12-23 DIAGNOSIS — E860 Dehydration: Secondary | ICD-10-CM | POA: Diagnosis present

## 2020-12-23 DIAGNOSIS — I440 Atrioventricular block, first degree: Secondary | ICD-10-CM | POA: Diagnosis present

## 2020-12-23 DIAGNOSIS — R531 Weakness: Secondary | ICD-10-CM | POA: Diagnosis not present

## 2020-12-23 DIAGNOSIS — R0902 Hypoxemia: Secondary | ICD-10-CM | POA: Diagnosis not present

## 2020-12-23 DIAGNOSIS — Z9079 Acquired absence of other genital organ(s): Secondary | ICD-10-CM

## 2020-12-23 DIAGNOSIS — Z9842 Cataract extraction status, left eye: Secondary | ICD-10-CM

## 2020-12-23 DIAGNOSIS — K8689 Other specified diseases of pancreas: Secondary | ICD-10-CM | POA: Diagnosis present

## 2020-12-23 DIAGNOSIS — R279 Unspecified lack of coordination: Secondary | ICD-10-CM | POA: Diagnosis not present

## 2020-12-23 DIAGNOSIS — Z452 Encounter for adjustment and management of vascular access device: Secondary | ICD-10-CM | POA: Diagnosis not present

## 2020-12-23 DIAGNOSIS — Z888 Allergy status to other drugs, medicaments and biological substances status: Secondary | ICD-10-CM

## 2020-12-23 DIAGNOSIS — Z8546 Personal history of malignant neoplasm of prostate: Secondary | ICD-10-CM

## 2020-12-23 DIAGNOSIS — I959 Hypotension, unspecified: Secondary | ICD-10-CM | POA: Diagnosis present

## 2020-12-23 DIAGNOSIS — R4182 Altered mental status, unspecified: Secondary | ICD-10-CM | POA: Diagnosis not present

## 2020-12-23 DIAGNOSIS — G8929 Other chronic pain: Secondary | ICD-10-CM | POA: Diagnosis present

## 2020-12-23 DIAGNOSIS — N1832 Chronic kidney disease, stage 3b: Secondary | ICD-10-CM

## 2020-12-23 DIAGNOSIS — N178 Other acute kidney failure: Secondary | ICD-10-CM | POA: Diagnosis not present

## 2020-12-23 DIAGNOSIS — Z79899 Other long term (current) drug therapy: Secondary | ICD-10-CM

## 2020-12-23 DIAGNOSIS — I517 Cardiomegaly: Secondary | ICD-10-CM | POA: Diagnosis not present

## 2020-12-23 DIAGNOSIS — E873 Alkalosis: Secondary | ICD-10-CM | POA: Diagnosis not present

## 2020-12-23 DIAGNOSIS — M6281 Muscle weakness (generalized): Secondary | ICD-10-CM | POA: Diagnosis not present

## 2020-12-23 DIAGNOSIS — E1110 Type 2 diabetes mellitus with ketoacidosis without coma: Secondary | ICD-10-CM | POA: Diagnosis present

## 2020-12-23 DIAGNOSIS — L89322 Pressure ulcer of left buttock, stage 2: Secondary | ICD-10-CM | POA: Diagnosis not present

## 2020-12-23 DIAGNOSIS — Z9841 Cataract extraction status, right eye: Secondary | ICD-10-CM

## 2020-12-23 DIAGNOSIS — Z96643 Presence of artificial hip joint, bilateral: Secondary | ICD-10-CM | POA: Diagnosis present

## 2020-12-23 DIAGNOSIS — Z8616 Personal history of COVID-19: Secondary | ICD-10-CM | POA: Diagnosis not present

## 2020-12-23 DIAGNOSIS — D6489 Other specified anemias: Secondary | ICD-10-CM | POA: Diagnosis present

## 2020-12-23 DIAGNOSIS — N186 End stage renal disease: Secondary | ICD-10-CM | POA: Diagnosis present

## 2020-12-23 DIAGNOSIS — I4891 Unspecified atrial fibrillation: Secondary | ICD-10-CM | POA: Diagnosis not present

## 2020-12-23 DIAGNOSIS — I10 Essential (primary) hypertension: Secondary | ICD-10-CM | POA: Diagnosis not present

## 2020-12-23 DIAGNOSIS — R7989 Other specified abnormal findings of blood chemistry: Secondary | ICD-10-CM | POA: Diagnosis present

## 2020-12-23 DIAGNOSIS — N179 Acute kidney failure, unspecified: Secondary | ICD-10-CM | POA: Diagnosis not present

## 2020-12-23 DIAGNOSIS — L89152 Pressure ulcer of sacral region, stage 2: Secondary | ICD-10-CM | POA: Diagnosis not present

## 2020-12-23 DIAGNOSIS — E875 Hyperkalemia: Secondary | ICD-10-CM | POA: Diagnosis not present

## 2020-12-23 DIAGNOSIS — E11311 Type 2 diabetes mellitus with unspecified diabetic retinopathy with macular edema: Secondary | ICD-10-CM | POA: Diagnosis present

## 2020-12-23 DIAGNOSIS — I130 Hypertensive heart and chronic kidney disease with heart failure and stage 1 through stage 4 chronic kidney disease, or unspecified chronic kidney disease: Secondary | ICD-10-CM | POA: Diagnosis not present

## 2020-12-23 DIAGNOSIS — Z833 Family history of diabetes mellitus: Secondary | ICD-10-CM

## 2020-12-23 DIAGNOSIS — R2689 Other abnormalities of gait and mobility: Secondary | ICD-10-CM | POA: Diagnosis not present

## 2020-12-23 DIAGNOSIS — Z9049 Acquired absence of other specified parts of digestive tract: Secondary | ICD-10-CM

## 2020-12-23 DIAGNOSIS — R404 Transient alteration of awareness: Secondary | ICD-10-CM | POA: Diagnosis not present

## 2020-12-23 DIAGNOSIS — L8990 Pressure ulcer of unspecified site, unspecified stage: Secondary | ICD-10-CM | POA: Insufficient documentation

## 2020-12-23 DIAGNOSIS — Z794 Long term (current) use of insulin: Secondary | ICD-10-CM

## 2020-12-23 DIAGNOSIS — Z974 Presence of external hearing-aid: Secondary | ICD-10-CM

## 2020-12-23 DIAGNOSIS — R32 Unspecified urinary incontinence: Secondary | ICD-10-CM | POA: Diagnosis not present

## 2020-12-23 DIAGNOSIS — N2581 Secondary hyperparathyroidism of renal origin: Secondary | ICD-10-CM | POA: Diagnosis present

## 2020-12-23 DIAGNOSIS — R627 Adult failure to thrive: Secondary | ICD-10-CM | POA: Diagnosis present

## 2020-12-23 DIAGNOSIS — M47816 Spondylosis without myelopathy or radiculopathy, lumbar region: Secondary | ICD-10-CM | POA: Diagnosis present

## 2020-12-23 DIAGNOSIS — J986 Disorders of diaphragm: Secondary | ICD-10-CM | POA: Diagnosis not present

## 2020-12-23 DIAGNOSIS — M549 Dorsalgia, unspecified: Secondary | ICD-10-CM | POA: Diagnosis present

## 2020-12-23 DIAGNOSIS — E782 Mixed hyperlipidemia: Secondary | ICD-10-CM | POA: Diagnosis present

## 2020-12-23 DIAGNOSIS — E8809 Other disorders of plasma-protein metabolism, not elsewhere classified: Secondary | ICD-10-CM | POA: Diagnosis present

## 2020-12-23 LAB — CBC WITH DIFFERENTIAL/PLATELET
Abs Immature Granulocytes: 0.08 10*3/uL — ABNORMAL HIGH (ref 0.00–0.07)
Basophils Absolute: 0 10*3/uL (ref 0.0–0.1)
Basophils Relative: 0 %
Eosinophils Absolute: 0.1 10*3/uL (ref 0.0–0.5)
Eosinophils Relative: 1 %
HCT: 28.8 % — ABNORMAL LOW (ref 39.0–52.0)
Hemoglobin: 9.3 g/dL — ABNORMAL LOW (ref 13.0–17.0)
Immature Granulocytes: 1 %
Lymphocytes Relative: 8 %
Lymphs Abs: 0.8 10*3/uL (ref 0.7–4.0)
MCH: 31.3 pg (ref 26.0–34.0)
MCHC: 32.3 g/dL (ref 30.0–36.0)
MCV: 97 fL (ref 80.0–100.0)
Monocytes Absolute: 0.6 10*3/uL (ref 0.1–1.0)
Monocytes Relative: 6 %
Neutro Abs: 8.9 10*3/uL — ABNORMAL HIGH (ref 1.7–7.7)
Neutrophils Relative %: 84 %
Platelets: 126 10*3/uL — ABNORMAL LOW (ref 150–400)
RBC: 2.97 MIL/uL — ABNORMAL LOW (ref 4.22–5.81)
RDW: 14.6 % (ref 11.5–15.5)
WBC: 10.5 10*3/uL (ref 4.0–10.5)
nRBC: 0 % (ref 0.0–0.2)

## 2020-12-23 LAB — COMPREHENSIVE METABOLIC PANEL
ALT: 27 U/L (ref 0–44)
AST: 14 U/L — ABNORMAL LOW (ref 15–41)
Albumin: 2.5 g/dL — ABNORMAL LOW (ref 3.5–5.0)
Alkaline Phosphatase: 101 U/L (ref 38–126)
Anion gap: 16 — ABNORMAL HIGH (ref 5–15)
BUN: 124 mg/dL — ABNORMAL HIGH (ref 8–23)
CO2: 7 mmol/L — ABNORMAL LOW (ref 22–32)
Calcium: 5.8 mg/dL — CL (ref 8.9–10.3)
Chloride: 114 mmol/L — ABNORMAL HIGH (ref 98–111)
Creatinine, Ser: 6.87 mg/dL — ABNORMAL HIGH (ref 0.61–1.24)
GFR, Estimated: 8 mL/min — ABNORMAL LOW (ref >60)
Glucose, Bld: 321 mg/dL — ABNORMAL HIGH (ref 70–99)
Potassium: 4.4 mmol/L (ref 3.5–5.1)
Sodium: 137 mmol/L (ref 135–145)
Total Bilirubin: 0.5 mg/dL (ref 0.3–1.2)
Total Protein: 5.7 g/dL — ABNORMAL LOW (ref 6.5–8.1)

## 2020-12-23 LAB — BASIC METABOLIC PANEL
Anion gap: 16 — ABNORMAL HIGH (ref 5–15)
BUN: 120 mg/dL — ABNORMAL HIGH (ref 8–23)
CO2: 11 mmol/L — ABNORMAL LOW (ref 22–32)
Calcium: 5.8 mg/dL — CL (ref 8.9–10.3)
Chloride: 114 mmol/L — ABNORMAL HIGH (ref 98–111)
Creatinine, Ser: 6.37 mg/dL — ABNORMAL HIGH (ref 0.61–1.24)
GFR, Estimated: 8 mL/min — ABNORMAL LOW (ref >60)
Glucose, Bld: 84 mg/dL (ref 70–99)
Potassium: 3.6 mmol/L (ref 3.5–5.1)
Sodium: 141 mmol/L (ref 135–145)

## 2020-12-23 LAB — CBG MONITORING, ED
Glucose-Capillary: 268 mg/dL — ABNORMAL HIGH (ref 70–99)
Glucose-Capillary: 148 mg/dL — ABNORMAL HIGH (ref 70–99)
Glucose-Capillary: 184 mg/dL — ABNORMAL HIGH (ref 70–99)
Glucose-Capillary: 120 mg/dL — ABNORMAL HIGH (ref 70–99)
Glucose-Capillary: 107 mg/dL — ABNORMAL HIGH (ref 70–99)
Glucose-Capillary: 98 mg/dL (ref 70–99)
Glucose-Capillary: 79 mg/dL (ref 70–99)
Glucose-Capillary: 78 mg/dL (ref 70–99)
Glucose-Capillary: 82 mg/dL (ref 70–99)
Glucose-Capillary: 82 mg/dL (ref 70–99)

## 2020-12-23 LAB — LIPASE, BLOOD: Lipase: 21 U/L (ref 11–51)

## 2020-12-23 LAB — URINALYSIS, ROUTINE W REFLEX MICROSCOPIC
Bilirubin Urine: NEGATIVE
Glucose, UA: 50 mg/dL — AB
Ketones, ur: NEGATIVE mg/dL
Leukocytes,Ua: NEGATIVE
Nitrite: NEGATIVE
Protein, ur: NEGATIVE mg/dL
Specific Gravity, Urine: 1.009 (ref 1.005–1.030)
pH: 5 (ref 5.0–8.0)

## 2020-12-23 LAB — BLOOD GAS, VENOUS
Acid-base deficit: 22.3 mmol/L — ABNORMAL HIGH (ref 0.0–2.0)
Bicarbonate: 8.1 mmol/L — ABNORMAL LOW (ref 20.0–28.0)
FIO2: 21
O2 Saturation: 92.1 %
Patient temperature: 37
pCO2, Ven: 19.9 mmHg — CL (ref 44.0–60.0)
pH, Ven: 7.089 — CL (ref 7.250–7.430)
pO2, Ven: 82.3 mmHg — ABNORMAL HIGH (ref 32.0–45.0)

## 2020-12-23 LAB — RESP PANEL BY RT-PCR (FLU A&B, COVID) ARPGX2
Influenza A by PCR: NEGATIVE
Influenza B by PCR: NEGATIVE
SARS Coronavirus 2 by RT PCR: POSITIVE — AB

## 2020-12-23 LAB — BETA-HYDROXYBUTYRIC ACID
Beta-Hydroxybutyric Acid: 0.14 mmol/L (ref 0.05–0.27)
Beta-Hydroxybutyric Acid: 0.13 mmol/L (ref 0.05–0.27)

## 2020-12-23 LAB — SODIUM, URINE, RANDOM: Sodium, Ur: 47 mmol/L

## 2020-12-23 LAB — LACTIC ACID, PLASMA
Lactic Acid, Venous: 1.1 mmol/L (ref 0.5–1.9)
Lactic Acid, Venous: 0.8 mmol/L (ref 0.5–1.9)

## 2020-12-23 LAB — TROPONIN I (HIGH SENSITIVITY)
Troponin I (High Sensitivity): 43 ng/L — ABNORMAL HIGH (ref <18)
Troponin I (High Sensitivity): 41 ng/L — ABNORMAL HIGH (ref <18)

## 2020-12-23 LAB — GLUCOSE, CAPILLARY
Glucose-Capillary: 80 mg/dL (ref 70–99)
Glucose-Capillary: 103 mg/dL — ABNORMAL HIGH (ref 70–99)

## 2020-12-23 LAB — TSH: TSH: 2.262 u[IU]/mL (ref 0.350–4.500)

## 2020-12-23 LAB — CREATININE, URINE, RANDOM: Creatinine, Urine: 43.85 mg/dL

## 2020-12-23 MED ORDER — ACETAMINOPHEN 650 MG RE SUPP: 650.0000 mg | Freq: Four times a day (QID) | RECTAL | Status: DC | PRN

## 2020-12-23 MED ORDER — LACTATED RINGERS IV BOLUS
20.0000 mL/kg | Freq: Once | INTRAVENOUS | Status: AC
  Administered 2020-12-23: 1742 mL via INTRAVENOUS

## 2020-12-23 MED ORDER — SODIUM CHLORIDE 0.9 % IV SOLN
100.0000 mg | Freq: Every day | INTRAVENOUS | Status: AC
  Administered 2020-12-24 – 2020-12-27 (×4): 100 mg via INTRAVENOUS
  Filled 2020-12-23 (×5): qty 20

## 2020-12-23 MED ORDER — ACETAMINOPHEN 325 MG PO TABS
650.0000 mg | ORAL_TABLET | Freq: Four times a day (QID) | ORAL | Status: DC | PRN
  Administered 2021-01-06 – 2021-01-17 (×8): 650 mg via ORAL
  Filled 2020-12-23 (×8): qty 2

## 2020-12-23 MED ORDER — ONDANSETRON HCL 4 MG PO TABS: 4.0000 mg | ORAL_TABLET | Freq: Four times a day (QID) | ORAL | Status: DC | PRN

## 2020-12-23 MED ORDER — STERILE WATER FOR INJECTION IV SOLN: INTRAVENOUS | Status: DC

## 2020-12-23 MED ORDER — LACTATED RINGERS IV SOLN: INTRAVENOUS | Status: DC

## 2020-12-23 MED ORDER — SODIUM BICARBONATE 8.4 % IV SOLN
INTRAVENOUS | Status: AC
  Filled 2020-12-23 (×3): qty 1000

## 2020-12-23 MED ORDER — DEXTROSE IN LACTATED RINGERS 5 % IV SOLN: INTRAVENOUS | Status: DC

## 2020-12-23 MED ORDER — SODIUM BICARBONATE 8.4 % IV SOLN
50.0000 meq | Freq: Once | INTRAVENOUS | Status: AC
  Administered 2020-12-23: 50 meq via INTRAVENOUS

## 2020-12-23 MED ORDER — POTASSIUM CHLORIDE 10 MEQ/100ML IV SOLN
10.0000 meq | INTRAVENOUS | Status: AC
  Administered 2020-12-23 (×2): 10 meq via INTRAVENOUS
  Filled 2020-12-23: qty 100

## 2020-12-23 MED ORDER — DEXTROSE 50 % IV SOLN: 0.0000 mL | INTRAVENOUS | Status: DC | PRN

## 2020-12-23 MED ORDER — SODIUM CHLORIDE 0.9 % IV SOLN
100.0000 mg | INTRAVENOUS | Status: AC
Start: 2020-12-23 — End: 2020-12-23
  Administered 2020-12-23 (×2): 100 mg via INTRAVENOUS
  Filled 2020-12-23: qty 20

## 2020-12-23 MED ORDER — ORAL CARE MOUTH RINSE
15.0000 mL | Freq: Two times a day (BID) | OROMUCOSAL | Status: DC
  Administered 2020-12-24 – 2021-01-25 (×61): 15 mL via OROMUCOSAL

## 2020-12-23 MED ORDER — CALCIUM GLUCONATE 10 % IV SOLN
1.0000 g | Freq: Once | INTRAVENOUS | Status: AC
  Administered 2020-12-23: 1 g via INTRAVENOUS
  Filled 2020-12-23: qty 10

## 2020-12-23 MED ORDER — SODIUM BICARBONATE 8.4 % IV SOLN
INTRAVENOUS | Status: AC
  Filled 2020-12-23: qty 150

## 2020-12-23 MED ORDER — SODIUM CHLORIDE 0.9 % IV BOLUS: 1000.0000 mL | Freq: Once | INTRAVENOUS | Status: DC

## 2020-12-23 MED ORDER — SODIUM CHLORIDE 0.9 % IV BOLUS
500.0000 mL | Freq: Once | INTRAVENOUS | Status: AC
  Administered 2020-12-23: 500 mL via INTRAVENOUS

## 2020-12-23 MED ORDER — ONDANSETRON HCL 4 MG/2ML IJ SOLN
4.0000 mg | Freq: Four times a day (QID) | INTRAMUSCULAR | Status: DC | PRN
  Administered 2021-01-05: 4 mg via INTRAVENOUS
  Filled 2020-12-23: qty 2

## 2020-12-23 MED ORDER — INSULIN REGULAR(HUMAN) IN NACL 100-0.9 UT/100ML-% IV SOLN
INTRAVENOUS | Status: DC
  Administered 2020-12-23: 0.7 [IU]/h via INTRAVENOUS
  Administered 2020-12-23: 4 [IU]/h via INTRAVENOUS
  Filled 2020-12-23 (×3): qty 100

## 2020-12-23 MED ORDER — CHLORHEXIDINE GLUCONATE CLOTH 2 % EX PADS
6.0000 | MEDICATED_PAD | Freq: Every day | CUTANEOUS | Status: DC
  Administered 2020-12-24 – 2021-01-15 (×22): 6 via TOPICAL

## 2020-12-23 NOTE — H&P (Signed)
History and Physical    [name] JSH:[id number] DOB: [birth year] DOA: 12/23/2020  PCP: [name], MD   Patient coming from: Home  Chief Complaint: Generalized weakness/confusion/poor PO intake  HPI: Mark Jumbo Sr. is a 80 y.o. male with medical history significant for type 2 diabetes, CKD stage IIIb, hypertension, dyslipidemia, diastolic CHF, and CAD described to the ED via EMS with increasing weakness and confusion along with poor appetite for the last 2 to 3 days.  Symptoms have been present over the past week and patient was diagnosed with COVID-19 infection on May 7.  He saw his PCP on 5/19 for confusion and was not noted to be hypoxemic at that time.  He was told to hold his Lasix and encouraged to drink plenty of fluids, but he really has not been able to drink very much fluids according to the wife at the bedside.  He was started on some mild delirium related to his COVID infection.  Unfortunately, his symptoms have progressed significantly to days.  Patient cannot give any further history at this time given his severe confusion.  Primary historian his wife at bedside.   ED Course: Vital signs with some initial hypotension that has responded to fluid bolus.  Additionally he was noted to be tachypneic on arrival to the ED, but tachypnea appears to be improving.  He is otherwise afebrile.  CT head with no acute findings noted.  He is noted to have significantly elevated creatinine of 6.87 for his baseline is usually between 1.7-2.  BUN is 124 and calcium is 5.8.  He has received LR fluid bolus in the ED as well as calcium supplementation and bicarbonate.  ABG shows pH of 7.108 with PCO2 of 19.9.  He is not currently hypoxemic.  He is noted to be COVID-positive.  Blood glucose was 321 and he was started on insulin drip.  Nephrology was contacted by EDP with recommendations for standard work-up of AKI as well as sodium bicarbonate infusion.  No need for urgent hemodialysis as  potassium levels are within normal limits.  Review of Systems: Cannot be obtained given patient condition.  Past Medical History:  Diagnosis Date  . Arthritis    lower back  . Chronic back pain    Disc disease  . Chronic kidney disease   . Coronary atherosclerosis of native coronary artery    Coronary calcifications by chest CT, Myoview demonstrating inferolateral scar  . Deafness in right ear   . Diabetes mellitus   . Diabetic retinopathy ([place])    moderate nonproliferative with macular edema in right eye  . Diastolic dysfunction 03/5026   Grade 1.  . Essential hypertension, benign   . Heart murmur    in past  . HOH (hard of hearing)   . Hyperkalemia   . Kidney stones   . Mixed hyperlipidemia   . NAFLD (nonalcoholic fatty liver disease)   . Pancreatic insufficiency    Diagnosed at [facility]  . Prostate cancer ([place]) 2011  . Shortness of breath dyspnea    occasional - liver pressing on right lung, decreased capacity  . Subdural hematoma ([place])   . Type 2 diabetes mellitus ([place])   . Wears hearing aid    "crossover" form right to left    Past Surgical History:  Procedure Laterality Date  . APPENDECTOMY    . Bilateral hip replacement    . CATARACT EXTRACTION W/PHACO Left 05/15/2015   Procedure: CATARACT EXTRACTION PHACO AND INTRAOCULAR LENS PLACEMENT (  [place]);  Surgeon: Ronnell Freshwater, MD;  Location: [facility];  Service: Ophthalmology;  Laterality: Left;  DIABETIC - insulin pump and oral meds  . CATARACT EXTRACTION W/PHACO Right 08/28/2015   Procedure: CATARACT EXTRACTION PHACO AND INTRAOCULAR LENS PLACEMENT ([facility]);  Surgeon: Ronnell Freshwater, MD;  Location: [place] [facility];  Service: Ophthalmology;  Laterality: Right;  DIABETIC PER PT AND CINDY PLEASE KEEP ARRIVAL TIME AFTER 8AM   . CHOLECYSTECTOMY    . HERNIA REPAIR    . PROSTATECTOMY  2011     reports that he has never smoked. He has never used smokeless tobacco. He reports that he does not  drink alcohol and does not use drugs.  Allergies  Allergen Reactions  . Enalapril Maleate Cough    Family History  Problem Relation Age of Onset  . Diabetes type II Mother   . Heart attack Father     Prior to Admission medications   Medication Sig Start Date End Date Taking? Authorizing Provider  albuterol (PROVENTIL HFA;VENTOLIN HFA) 108 (90 Base) MCG/ACT inhaler Inhale 2 puffs into the lungs every 4 (four) hours as needed for wheezing or shortness of breath. 07/24/18   [name], MD  aspirin EC 81 MG tablet Take 81 mg by mouth at bedtime.     [provider]  carvedilol (COREG) 25 MG tablet TAKE 1 TABLET(25 MG) BY MOUTH TWICE DAILY WITH A MEAL 03/22/20   [name], MD  Continuous Blood Gluc Receiver (FREESTYLE LIBRE 2 READER) DEVI Use 1 Device as directed 04/17/20   [provider]  diazepam (VALIUM) 5 MG tablet TAKE 1 TABLET BY MOUTH TWICE DAILY AS NEEDED FOR ANXIETY 12/08/20   [name], MD  doxazosin (CARDURA) 8 MG tablet TAKE 1 TABLET(8 MG) BY MOUTH DAILY 04/26/20   [name], MD  ergocalciferol (VITAMIN D2) 1.25 MG (50000 UT) capsule Take by mouth.    [provider]  furosemide (LASIX) 20 MG tablet TAKE 1 AND 1/2 TABLETS(30 MG) BY MOUTH DAILY 05/18/20   [name], MD  insulin regular (NOVOLIN R,HUMULIN R) 100 units/mL injection Inject 8 Units into the skin 3 (three) times daily before meals. 12u am  10u supper    [provider]  ketoconazole (NIZORAL) 2 % cream Apply to both feet and between toes once daily for 4 weeks. 11/08/20   Marzetta Board, DPM  Multiple Vitamin (MULTIVITAMIN WITH MINERALS) TABS Take 1 tablet by mouth daily.    [provider]  pravastatin (PRAVACHOL) 10 MG tablet TAKE 1 TABLET(10 MG) BY MOUTH DAILY WITH BREAKFAST 12/04/20   [name], MD  valACYclovir (VALTREX) 1000 MG tablet Take 1 tablet (1,000 mg total) by mouth 3 (three) times daily. 08/20/19   [name],  MD  ZENPEP 10000-32000 units CPEP TAKE 1 CAPSULE BY MOUTH THREE TIMES DAILY BEFORE MEALS 05/10/20   [name], MD    Physical Exam: Vitals:   12/23/20 1236 12/23/20 1300 12/23/20 1330 12/23/20 1430  BP: (!) 103/54 (!) 95/53 (!) 99/57 (!) 112/56  Pulse: 80 77 82 97  Resp: (!) 27 19 (!) 27 19  Temp:      TempSrc:      SpO2: 99% 99% 99% 97%    Constitutional: Appears moderately distressed with some mild tachypnea, confused/incoherent Vitals:   12/23/20 1236 12/23/20 1300 12/23/20 1330 12/23/20 1430  BP: (!) 103/54 (!) 95/53 (!) 99/57 (!) 112/56  Pulse: 80 77 82 97  Resp: (!) 27 19 (!) 27 19  Temp:      TempSrc:      SpO2: 99% 99% 99% 97%   Eyes: lids and conjunctivae normal Neck: normal, supple, dry oral mucosa Respiratory: clear to auscultation bilaterally. Normal respiratory effort. No accessory muscle use.  Cardiovascular: Regular rate and rhythm, no murmurs. Abdomen: no tenderness, no distention. Bowel sounds positive.  Musculoskeletal:  No edema. Skin: no rashes, lesions, ulcers.  Psychiatric: Flat affect  Labs on Admission: I have personally reviewed following labs and imaging studies  CBC: Recent Labs  Lab 12/23/20 1153  WBC 10.5  NEUTROABS 8.9*  HGB 9.3*  HCT 28.8*  MCV 97.0  PLT 626*   Basic Metabolic Panel: Recent Labs  Lab 12/23/20 1153  NA 137  K 4.4  CL 114*  CO2 7*  GLUCOSE 321*  BUN 124*  CREATININE 6.87*  CALCIUM 5.8*   GFR: Estimated Creatinine Clearance: 9.4 mL/min (A) (by C-G formula based on SCr of 6.87 mg/dL (H)). Liver Function Tests: Recent Labs  Lab 12/23/20 1153  AST 14*  ALT 27  ALKPHOS 101  BILITOT 0.5  PROT 5.7*  ALBUMIN 2.5*   Recent Labs  Lab 12/23/20 1153  LIPASE 21   No results for input(s): AMMONIA in the last 168 hours. Coagulation Profile: No results for input(s): INR, PROTIME in the last 168 hours. Cardiac Enzymes: No results for input(s): CKTOTAL, CKMB, CKMBINDEX, TROPONINI in the last 168  hours. BNP (last 3 results) No results for input(s): PROBNP in the last 8760 hours. HbA1C: No results for input(s): HGBA1C in the last 72 hours. CBG: Recent Labs  Lab 12/23/20 1316 12/23/20 1445  GLUCAP 268* 184*   Lipid Profile: No results for input(s): CHOL, HDL, LDLCALC, TRIG, CHOLHDL, LDLDIRECT in the last 72 hours. Thyroid Function Tests: No results for input(s): TSH, T4TOTAL, FREET4, T3FREE, THYROIDAB in the last 72 hours. Anemia Panel: No results for input(s): VITAMINB12, FOLATE, FERRITIN, TIBC, IRON, RETICCTPCT in the last 72 hours. Urine analysis:    Component Value Date/Time   COLORURINE YELLOW 09/17/2019 [place] 09/17/2019 1134   LABSPEC 1.020 09/17/2019 1134   PHURINE 5.0 09/17/2019 1134   GLUCOSEU 1+ (A) 09/17/2019 1134   HGBUR NEGATIVE 09/17/2019 1134   BILIRUBINUR NEGATIVE 12/02/2018 0946   KETONESUR NEGATIVE 09/17/2019 1134   PROTEINUR TRACE (A) 09/17/2019 1134   UROBILINOGEN 0.2 03/20/2013 2016   NITRITE NEGATIVE 09/17/2019 1134   LEUKOCYTESUR NEGATIVE 09/17/2019 1134    Radiological Exams on Admission: CT Head Wo Contrast  Result Date: 12/23/2020 CLINICAL DATA:  Mental status changes EXAM: CT HEAD WITHOUT CONTRAST TECHNIQUE: Contiguous axial images were obtained from the base of the skull through the vertex without intravenous contrast. COMPARISON:  12/04/2018 FINDINGS: Brain: No acute intracranial abnormality. Specifically, no hemorrhage, hydrocephalus, mass lesion, acute infarction, or significant intracranial injury. There is atrophy and chronic small vessel disease changes. Vascular: No hyperdense vessel or unexpected calcification. Skull: No acute calvarial abnormality. Sinuses/Orbits: No acute findings Other: None IMPRESSION: Atrophy, chronic microvascular disease. No acute intracranial abnormality. Electronically Signed   By: Rolm Baptise M.[name].   On: 12/23/2020 12:54   DG Chest Portable 1 View  Result Date: 12/23/2020 CLINICAL DATA:   Weakness. COVID 2 weeks ago. EXAM: PORTABLE CHEST 1 VIEW COMPARISON:  Two-view chest x-ray 12/03/2018 FINDINGS: Heart is chronically enlarged. Chronic elevation of the right hemidiaphragm noted. No edema or effusion is present. No focal airspace disease is present. Axial skeleton is unremarkable. IMPRESSION:  No acute cardiopulmonary disease or significant interval change. Stable cardiomegaly without failure. Electronically Signed   By: [place] Morelle M.[name].   On: 12/23/2020 12:33    EKG: Independently reviewed.  Sinus rhythm 79 bpm with prolonged PR interval.  Assessment/Plan Active Problems:   AKI (acute kidney injury) (HCC)    Generalized weakness with acute metabolic encephalopathy secondary to AKI with severe metabolic acidosis and uncompensated respiratory alkalosis -Severe acidosis related to AKI  -Beta hydroxybutyrate is not elevated -Careful monitoring in stepdown unit with BiPAP support to assist with work of breathing -Keep n.p.o. for now until mentation improves  AKI on CKD3b-suspect prerenal with dehydration -Continue on IV fluid with sodium bicarbonate-isotonic -Check urine electrolytes -Renal ultrasound -Monitor I's and O's -Avoid nephrotoxic agents -Appreciate nephrology evaluation  Hyperglycemia in setting of DM2 -Compliant with home medications according to wife at bedside -Likely stress-induced -Check hemoglobin A1c -Continue on insulin drip with D5 isotonic bicarb while n.p.o.  COVID-19 infection -Chest x-ray with stable cardiomegaly and no other signs of infiltrate -Start on remdesivir -No need for steroids given no hypoxemia, and would be high risk given hyperglycemia -Isolation precautions  Thrombocytopenia -Continue to monitor closely -Avoid heparin agents  Hypertension/CAD -Hold home medications for now until blood pressure improves and patient can tolerate p.o. intake   DVT prophylaxis: SCDs Code Status: Full Family Communication: Discussed  with wife at bedside Disposition Plan: Admit for treatment of severe acidosis/AKI Consults called: Nephrology Admission status: Inpatient, stepdown unit  [name] Mark Shadix DO [facility]  If 7PM-7AM, please contact night-coverage [email or web address]  12/23/2020, 2:54 PM

## 2020-12-23 NOTE — Progress Notes (Signed)
Pt k is 3.6   Calcium 5.8 BS 80 with Q 30 minute BS checks per endo tool    infor sent to Dr Josephine Cables

## 2020-12-23 NOTE — ED Notes (Signed)
Date and time results received: 12/23/20 12:44 PM    Test: CA Critical Value: 5.8  Test: CO2 Critical Value: 7  Name of Provider Notified: Dr. Laverta Baltimore  Orders Received? Or Actions Taken?: see orders

## 2020-12-23 NOTE — Progress Notes (Signed)
Remdesivir - Pharmacy Brief Note   A/P:  Remdesivir 200 mg IVPB once followed by 100 mg IVPB daily x 4 days.   Hart Robinsons, PharmD Clinical Pharmacist

## 2020-12-23 NOTE — ED Notes (Signed)
Phlebotomy at bedside drawing labs at this time

## 2020-12-23 NOTE — Progress Notes (Signed)
Pt admitted to floor with insulin drip off..  When we were moving pt from stretcher to ICU bed we found 1 hearing aid wrapped up in his blanket,,  Per ER report pt has 1 hearing aid so we assumed this was his.Marland Kitchen  Hearing aid put into a denture cup, labled with pt name and left in pt room near sink..  Pt confused , could not answer questions, very sleepy , with Bicarb drip infusing

## 2020-12-23 NOTE — ED Provider Notes (Signed)
Emergency Department Provider Note   I have reviewed the triage vital signs and the nursing notes.   HISTORY  Chief Complaint Weakness   HPI Mark BAZAR Sr. is a 80 y.o. male with past medical history reviewed below including COVID-19 infection on May 7 presents to the emergency department with increased weakness, confusion, poor appetite.  Symptoms have been worsening over the past week but by her EMS report have been present since he was diagnosed with COVID. Patient denies any pain but is somnolent. Denies any active pain.   Level 5 caveat: AMS   Past Medical History:  Diagnosis Date  . Arthritis    lower back  . Chronic back pain    Disc disease  . Chronic kidney disease   . Coronary atherosclerosis of native coronary artery    Coronary calcifications by chest CT, Myoview demonstrating inferolateral scar  . Deafness in right ear   . Diabetes mellitus   . Diabetic retinopathy ([place])    moderate nonproliferative with macular edema in right eye  . Diastolic dysfunction 08/69   Grade 1.  . Essential hypertension, benign   . Heart murmur    in past  . HOH (hard of hearing)   . Hyperkalemia   . Kidney stones   . Mixed hyperlipidemia   . NAFLD (nonalcoholic fatty liver disease)   . Pancreatic insufficiency    Diagnosed at [facility]  . Prostate cancer ([place]) 2011  . Shortness of breath dyspnea    occasional - liver pressing on right lung, decreased capacity  . Subdural hematoma ([place])   . Type 2 diabetes mellitus ([place])   . Wears hearing aid    "crossover" form right to left    Patient Active Problem List   Diagnosis Date Noted  . AKI (acute kidney injury) ([place]) 12/23/2020  . Hyperkalemia 12/02/2018  . Fever, unknown origin 12/02/2018  . Altered mental state 12/02/2018  . Acute on chronic renal failure ([place]) 12/02/2018  . Pancreatic insufficiency 12/02/2018  . Wears hearing aid 12/02/2018  . HOH (hard of hearing) 12/02/2018  . DM type 2 with diabetic  peripheral neuropathy ([place]) 09/15/2018  . Uncontrolled type 2 diabetes mellitus with hyperglycemia ([place]) 09/15/2018  . Peripheral edema 06/25/2018  . NAFLD (nonalcoholic fatty liver disease)   . Diabetic retinopathy ([place])   . ED (erectile dysfunction) 01/05/2014  . Shortness of breath 12/05/2011  . Coronary atherosclerosis of native coronary artery 12/05/2011  . Type 2 diabetes mellitus ([place]) 12/05/2011  . Essential hypertension, benign 12/05/2011  . Mixed hyperlipidemia 12/05/2011  . Malignant neoplasm of prostate ([place]) 06/26/2011    Past Surgical History:  Procedure Laterality Date  . APPENDECTOMY    . Bilateral hip replacement    . CATARACT EXTRACTION W/PHACO Left 05/15/2015   Procedure: CATARACT EXTRACTION PHACO AND INTRAOCULAR LENS PLACEMENT (IOC);  Surgeon: Ronnell Freshwater, MD;  Location: [facility];  Service: Ophthalmology;  Laterality: Left;  DIABETIC - insulin pump and oral meds  . CATARACT EXTRACTION W/PHACO Right 08/28/2015   Procedure: CATARACT EXTRACTION PHACO AND INTRAOCULAR LENS PLACEMENT (IOC);  Surgeon: Ronnell Freshwater, MD;  Location: [facility];  Service: Ophthalmology;  Laterality: Right;  DIABETIC PER PT AND CINDY PLEASE KEEP ARRIVAL TIME AFTER 8AM   . CHOLECYSTECTOMY    . HERNIA REPAIR    . PROSTATECTOMY  2011    Allergies Enalapril maleate  Family History  Problem Relation Age of Onset  . Diabetes type II Mother   .  Heart attack Father     Social History Social History   Tobacco Use  . Smoking status: Never Smoker  . Smokeless tobacco: Never Used  Substance Use Topics  . Alcohol use: No  . Drug use: No    Review of Systems  Level 5 caveat: Weakness/confusion.   ____________________________________________   PHYSICAL EXAM:  VITAL SIGNS: ED Triage Vitals  Enc Vitals Group     BP 12/23/20 1133 (!) 105/48     Pulse Rate 12/23/20 1133 80     Resp 12/23/20 1133 (!) 23     Temp 12/23/20 1133 98 F (36.7  C)     Temp Source 12/23/20 1133 Oral     SpO2 12/23/20 1133 98 %   Constitutional: Somnolent but opens eyes to voice and light touch.  Eyes: Conjunctivae are normal. PERRL. Head: Atraumatic. Nose: No congestion/rhinnorhea. Mouth/Throat: Mucous membranes are moist.   Neck: No stridor.   Cardiovascular: Normal rate, regular rhythm. Good peripheral circulation. Grossly normal heart sounds.   Respiratory: Normal respiratory effort.  No retractions. Lungs CTAB. Gastrointestinal: Soft and nontender. No distention.  Musculoskeletal: No lower extremity tenderness nor edema. No gross deformities of extremities. Neurologic:  Normal speech and language.  Patient is globally weak but has no pronator drift or focal weakness. No facial asymmetry.  Skin:  Skin is warm, dry and intact. No rash noted.   ____________________________________________   LABS (all labs ordered are listed, but only abnormal results are displayed)  Labs Reviewed  RESP PANEL BY RT-PCR (FLU A&B, COVID) ARPGX2 - Abnormal; Notable for the following components:      Result Value   SARS Coronavirus 2 by RT PCR POSITIVE (*)    All other components within normal limits  COMPREHENSIVE METABOLIC PANEL - Abnormal; Notable for the following components:   Chloride 114 (*)    CO2 7 (*)    Glucose, Bld 321 (*)    BUN 124 (*)    Creatinine, Ser 6.87 (*)    Calcium 5.8 (*)    Total Protein 5.7 (*)    Albumin 2.5 (*)    AST 14 (*)    GFR, Estimated 8 (*)    Anion gap 16 (*)    All other components within normal limits  CBC WITH DIFFERENTIAL/PLATELET - Abnormal; Notable for the following components:   RBC 2.97 (*)    Hemoglobin 9.3 (*)    HCT 28.8 (*)    Platelets 126 (*)    Neutro Abs 8.9 (*)    Abs Immature Granulocytes 0.08 (*)    All other components within normal limits  BLOOD GAS, VENOUS - Abnormal; Notable for the following components:   pH, Ven 7.089 (*)    pCO2, Ven 19.9 (*)    pO2, Ven 82.3 (*)    Bicarbonate  8.1 (*)    Acid-base deficit 22.3 (*)    All other components within normal limits  CBG MONITORING, ED - Abnormal; Notable for the following components:   Glucose-Capillary 268 (*)    All other components within normal limits  CBG MONITORING, ED - Abnormal; Notable for the following components:   Glucose-Capillary 184 (*)    All other components within normal limits  TROPONIN I (HIGH SENSITIVITY) - Abnormal; Notable for the following components:   Troponin I (High Sensitivity) 43 (*)    All other components within normal limits  TROPONIN I (HIGH SENSITIVITY) - Abnormal; Notable for the following components:   Troponin I (High Sensitivity) 41 (*)  All other components within normal limits  URINE CULTURE  LIPASE, BLOOD  BETA-HYDROXYBUTYRIC ACID  URINALYSIS, ROUTINE W REFLEX MICROSCOPIC  BETA-HYDROXYBUTYRIC ACID  LACTIC ACID, PLASMA  LACTIC ACID, PLASMA  SODIUM, URINE, RANDOM  CREATININE, URINE, RANDOM   ____________________________________________  EKG   EKG Interpretation  Date/Time:  Saturday Dec 23 2020 11:58:44 EDT Ventricular Rate:  79 PR Interval:  217 QRS Duration: 136 QT Interval:  429 QTC Calculation: 492 R Axis:   -27 Text Interpretation: Sinus rhythm Borderline prolonged PR interval Right bundle branch block Confirmed by Nanda Quinton [phone]) on 12/23/2020 12:41:51 PM       ____________________________________________  RADIOLOGY  CT Head Wo Contrast  Result Date: 12/23/2020 CLINICAL DATA:  Mental status changes EXAM: CT HEAD WITHOUT CONTRAST TECHNIQUE: Contiguous axial images were obtained from the base of the skull through the vertex without intravenous contrast. COMPARISON:  12/04/2018 FINDINGS: Brain: No acute intracranial abnormality. Specifically, no hemorrhage, hydrocephalus, mass lesion, acute infarction, or significant intracranial injury. There is atrophy and chronic small vessel disease changes. Vascular: No hyperdense vessel or unexpected  calcification. Skull: No acute calvarial abnormality. Sinuses/Orbits: No acute findings Other: None IMPRESSION: Atrophy, chronic microvascular disease. No acute intracranial abnormality. Electronically Signed   By: Rolm Baptise M.D.   On: 12/23/2020 12:54   DG Chest Portable 1 View  Result Date: 12/23/2020 CLINICAL DATA:  Weakness. COVID 2 weeks ago. EXAM: PORTABLE CHEST 1 VIEW COMPARISON:  Two-view chest x-ray 12/03/2018 FINDINGS: Heart is chronically enlarged. Chronic elevation of the right hemidiaphragm noted. No edema or effusion is present. No focal airspace disease is present. Axial skeleton is unremarkable. IMPRESSION: No acute cardiopulmonary disease or significant interval change. Stable cardiomegaly without failure. Electronically Signed   By: San Morelle M.D.   On: 12/23/2020 12:33    ____________________________________________   PROCEDURES  Procedure(s) performed:   .Critical Care Performed by: Margette Fast, MD Authorized by: Margette Fast, MD   Critical care provider statement:    Critical care time (minutes):  45   Critical care time was exclusive of:  Separately billable procedures and treating other patients and teaching time   Critical care was necessary to treat or prevent imminent or life-threatening deterioration of the following conditions:  Metabolic crisis   Critical care was time spent personally by me on the following activities:  Discussions with consultants, evaluation of patient's response to treatment, examination of patient, ordering and performing treatments and interventions, ordering and review of laboratory studies, ordering and review of radiographic studies, pulse oximetry, re-evaluation of patient's condition, obtaining history from patient or surrogate, review of old charts, development of treatment plan with patient or surrogate and blood draw for specimens   I assumed direction of critical care for this patient from another provider in my  specialty: no     Care discussed with: admitting provider       ____________________________________________   INITIAL IMPRESSION / [place] / ED COURSE  Pertinent labs & imaging results that were available during my care of the patient were reviewed by me and considered in my medical decision making (see chart for details).    Patient presents emergency department increased fatigue and somnolence in the setting of COVID-19 infection 2 weeks ago.  I reviewed his PCP note from this past week.  He does not have any focal neurologic deficits to suspect stroke.  I will obtain CT imaging of the head.  This could be related to COVID or deconditioning in the setting  of this infection.  He is not hypoxic.   12:57 PM  Patient's chemistry coming back with an anion gap of 16, hyperglycemia to 321 and CO2 of 7.  Patient is in acute renal failure with creatinine of 6.87 and hypocalcemia 5.8.  Potassium is normal. Plan for IV insulin, calcium, and will consult with nephrology.   Spoke with Dr. Posey Pronto on for nephrology.  He reviewed the lab work.  Agrees with meds given so far.  Would administer LR bolus followed by LR at rate of 150 mils per hour.  Bladder scan has around 100 mL in the bladder and so doubt obstructive cause for the patient's acute kidney injury/failure.  Patient does not require emergent dialysis.  Would expect labs to normalize over the next 12 to 24 hours.  If lab abnormalities persist or patient clinically worsens nephrology can re-consulted.   Discussed patient's case with [facility], Dr. Manuella Ghazi to request admission. Patient and family (if present) updated with plan. Care transferred to [facility] service.  I reviewed all nursing notes, vitals, pertinent old records, EKGs, labs, imaging (as available).  ____________________________________________  FINAL CLINICAL IMPRESSION(S) / ED DIAGNOSES  Final diagnoses:  AKI (acute kidney injury) ([place])  Metabolic acidosis     MEDICATIONS  GIVEN DURING THIS VISIT:  Medications  insulin regular, human (MYXREDLIN) 100 units/ 100 mL infusion (4 Units/hr Intravenous New Bag/Given 12/23/20 1406)  dextrose 50 % solution 0-50 mL (has no administration in time range)  potassium chloride 10 mEq in 100 mL IVPB (10 mEq Intravenous New Bag/Given 12/23/20 1354)  sodium bicarbonate 150 mEq in dextrose 5 % 1,150 mL infusion (has no administration in time range)  sodium chloride 0.9 % bolus 500 mL (0 mLs Intravenous Stopped 12/23/20 1311)  calcium gluconate inj 10% (1 g) URGENT USE ONLY! (1 g Intravenous Given 12/23/20 1342)  lactated ringers bolus 1,742 mL (0 mL/kg  87.1 kg Intravenous Stopped 12/23/20 1456)  sodium bicarbonate injection 50 mEq (50 mEq Intravenous Given 12/23/20 1408)    Note:  This document was prepared using Dragon voice recognition software and may include unintentional dictation errors.  Nanda Quinton, MD, [facility] Emergency Medicine    Hamish Banks, [place], MD 12/23/20 [phone]

## 2020-12-23 NOTE — ED Triage Notes (Signed)
Pt from home via [facility]. Per family pt dx with COVID 2 weeks ago. Since that time pt has been increasingly weak and unable eat or drink. Pt received 471m nacl en route.

## 2020-12-23 NOTE — Progress Notes (Signed)
Pt awake, demanding his hearing aid that is in a cup with his name on it.[name]  Hearing aid given to pt and he put it back into his ear.. pt . very agitated over this over this .Mark KitchenMarland [name]

## 2020-12-23 NOTE — ED Notes (Signed)
Patient transported to CT 

## 2020-12-23 NOTE — ED Notes (Signed)
AC notified about med needed from main pharmacy.

## 2020-12-24 ENCOUNTER — Inpatient Hospital Stay (HOSPITAL_COMMUNITY): Payer: PPO

## 2020-12-24 DIAGNOSIS — N179 Acute kidney failure, unspecified: Secondary | ICD-10-CM | POA: Diagnosis not present

## 2020-12-24 LAB — C-REACTIVE PROTEIN: CRP: 2.8 mg/dL — ABNORMAL HIGH (ref <1.0)

## 2020-12-24 LAB — MAGNESIUM: Magnesium: 1.2 mg/dL — ABNORMAL LOW (ref 1.7–2.4)

## 2020-12-24 LAB — GLUCOSE, CAPILLARY
Glucose-Capillary: 115 mg/dL — ABNORMAL HIGH (ref 70–99)
Glucose-Capillary: 128 mg/dL — ABNORMAL HIGH (ref 70–99)
Glucose-Capillary: 131 mg/dL — ABNORMAL HIGH (ref 70–99)
Glucose-Capillary: 143 mg/dL — ABNORMAL HIGH (ref 70–99)
Glucose-Capillary: 140 mg/dL — ABNORMAL HIGH (ref 70–99)
Glucose-Capillary: 119 mg/dL — ABNORMAL HIGH (ref 70–99)
Glucose-Capillary: 122 mg/dL — ABNORMAL HIGH (ref 70–99)
Glucose-Capillary: 109 mg/dL — ABNORMAL HIGH (ref 70–99)
Glucose-Capillary: 154 mg/dL — ABNORMAL HIGH (ref 70–99)
Glucose-Capillary: 140 mg/dL — ABNORMAL HIGH (ref 70–99)
Glucose-Capillary: 143 mg/dL — ABNORMAL HIGH (ref 70–99)
Glucose-Capillary: 137 mg/dL — ABNORMAL HIGH (ref 70–99)
Glucose-Capillary: 156 mg/dL — ABNORMAL HIGH (ref 70–99)
Glucose-Capillary: 144 mg/dL — ABNORMAL HIGH (ref 70–99)

## 2020-12-24 LAB — RENAL FUNCTION PANEL
Albumin: 2.2 g/dL — ABNORMAL LOW (ref 3.5–5.0)
Anion gap: 15 (ref 5–15)
BUN: 110 mg/dL — ABNORMAL HIGH (ref 8–23)
CO2: 17 mmol/L — ABNORMAL LOW (ref 22–32)
Calcium: 5.8 mg/dL — CL (ref 8.9–10.3)
Chloride: 110 mmol/L (ref 98–111)
Creatinine, Ser: 5.94 mg/dL — ABNORMAL HIGH (ref 0.61–1.24)
GFR, Estimated: 9 mL/min — ABNORMAL LOW (ref >60)
Glucose, Bld: 140 mg/dL — ABNORMAL HIGH (ref 70–99)
Phosphorus: 9 mg/dL — ABNORMAL HIGH (ref 2.5–4.6)
Potassium: 3.3 mmol/L — ABNORMAL LOW (ref 3.5–5.1)
Sodium: 142 mmol/L (ref 135–145)

## 2020-12-24 LAB — BASIC METABOLIC PANEL
Anion gap: 16 — ABNORMAL HIGH (ref 5–15)
Anion gap: 17 — ABNORMAL HIGH (ref 5–15)
Anion gap: 17 — ABNORMAL HIGH (ref 5–15)
BUN: 99 mg/dL — ABNORMAL HIGH (ref 8–23)
BUN: 112 mg/dL — ABNORMAL HIGH (ref 8–23)
BUN: 112 mg/dL — ABNORMAL HIGH (ref 8–23)
CO2: 14 mmol/L — ABNORMAL LOW (ref 22–32)
CO2: 14 mmol/L — ABNORMAL LOW (ref 22–32)
CO2: 15 mmol/L — ABNORMAL LOW (ref 22–32)
Calcium: 5.2 mg/dL — CL (ref 8.9–10.3)
Calcium: 5.7 mg/dL — CL (ref 8.9–10.3)
Calcium: 5.8 mg/dL — CL (ref 8.9–10.3)
Chloride: 111 mmol/L (ref 98–111)
Chloride: 109 mmol/L (ref 98–111)
Chloride: 109 mmol/L (ref 98–111)
Creatinine, Ser: 6.34 mg/dL — ABNORMAL HIGH (ref 0.61–1.24)
Creatinine, Ser: 6.1 mg/dL — ABNORMAL HIGH (ref 0.61–1.24)
Creatinine, Ser: 5.94 mg/dL — ABNORMAL HIGH (ref 0.61–1.24)
GFR, Estimated: 8 mL/min — ABNORMAL LOW (ref >60)
GFR, Estimated: 9 mL/min — ABNORMAL LOW (ref >60)
GFR, Estimated: 9 mL/min — ABNORMAL LOW (ref >60)
Glucose, Bld: 148 mg/dL — ABNORMAL HIGH (ref 70–99)
Glucose, Bld: 114 mg/dL — ABNORMAL HIGH (ref 70–99)
Glucose, Bld: 140 mg/dL — ABNORMAL HIGH (ref 70–99)
Potassium: 3.3 mmol/L — ABNORMAL LOW (ref 3.5–5.1)
Potassium: 3.4 mmol/L — ABNORMAL LOW (ref 3.5–5.1)
Potassium: 3.2 mmol/L — ABNORMAL LOW (ref 3.5–5.1)
Sodium: 141 mmol/L (ref 135–145)
Sodium: 140 mmol/L (ref 135–145)
Sodium: 141 mmol/L (ref 135–145)

## 2020-12-24 LAB — HEMOGLOBIN A1C
Hgb A1c MFr Bld: 7 % — ABNORMAL HIGH (ref 4.8–5.6)
Mean Plasma Glucose: 154.2 mg/dL

## 2020-12-24 LAB — COMPREHENSIVE METABOLIC PANEL
ALT: 23 U/L (ref 0–44)
AST: 16 U/L (ref 15–41)
Albumin: 2.3 g/dL — ABNORMAL LOW (ref 3.5–5.0)
Alkaline Phosphatase: 88 U/L (ref 38–126)
Anion gap: 17 — ABNORMAL HIGH (ref 5–15)
BUN: 113 mg/dL — ABNORMAL HIGH (ref 8–23)
CO2: 13 mmol/L — ABNORMAL LOW (ref 22–32)
Calcium: 5.5 mg/dL — CL (ref 8.9–10.3)
Chloride: 113 mmol/L — ABNORMAL HIGH (ref 98–111)
Creatinine, Ser: 6.29 mg/dL — ABNORMAL HIGH (ref 0.61–1.24)
GFR, Estimated: 8 mL/min — ABNORMAL LOW (ref >60)
Glucose, Bld: 135 mg/dL — ABNORMAL HIGH (ref 70–99)
Potassium: 3.4 mmol/L — ABNORMAL LOW (ref 3.5–5.1)
Sodium: 143 mmol/L (ref 135–145)
Total Bilirubin: 0.4 mg/dL (ref 0.3–1.2)
Total Protein: 5 g/dL — ABNORMAL LOW (ref 6.5–8.1)

## 2020-12-24 LAB — CBC
HCT: 25.3 % — ABNORMAL LOW (ref 39.0–52.0)
Hemoglobin: 8.5 g/dL — ABNORMAL LOW (ref 13.0–17.0)
MCH: 31.1 pg (ref 26.0–34.0)
MCHC: 33.6 g/dL (ref 30.0–36.0)
MCV: 92.7 fL (ref 80.0–100.0)
Platelets: 147 10*3/uL — ABNORMAL LOW (ref 150–400)
RBC: 2.73 MIL/uL — ABNORMAL LOW (ref 4.22–5.81)
RDW: 14.3 % (ref 11.5–15.5)
WBC: 10.4 10*3/uL (ref 4.0–10.5)
nRBC: 0 % (ref 0.0–0.2)

## 2020-12-24 LAB — BLOOD GAS, ARTERIAL
Acid-base deficit: 15.4 mmol/L — ABNORMAL HIGH (ref 0.0–2.0)
Bicarbonate: 12.6 mmol/L — ABNORMAL LOW (ref 20.0–28.0)
FIO2: 21
O2 Saturation: 96.4 %
Patient temperature: 37
pCO2 arterial: 25.5 mmHg — ABNORMAL LOW (ref 32.0–48.0)
pH, Arterial: 7.24 — ABNORMAL LOW (ref 7.350–7.450)
pO2, Arterial: 102 mmHg (ref 83.0–108.0)

## 2020-12-24 LAB — BETA-HYDROXYBUTYRIC ACID: Beta-Hydroxybutyric Acid: 0.56 mmol/L — ABNORMAL HIGH (ref 0.05–0.27)

## 2020-12-24 LAB — MRSA PCR SCREENING: MRSA by PCR: NEGATIVE

## 2020-12-24 LAB — FERRITIN: Ferritin: 98 ng/mL (ref 24–336)

## 2020-12-24 MED ORDER — INSULIN ASPART 100 UNIT/ML IJ SOLN
0.0000 [IU] | Freq: Every day | INTRAMUSCULAR | Status: DC
  Administered 2020-12-26: 2 [IU] via SUBCUTANEOUS
  Administered 2020-12-27: 3 [IU] via SUBCUTANEOUS
  Administered 2020-12-28 – 2021-01-10 (×8): 2 [IU] via SUBCUTANEOUS

## 2020-12-24 MED ORDER — MAGNESIUM SULFATE IN D5W 1-5 GM/100ML-% IV SOLN
1.0000 g | Freq: Once | INTRAVENOUS | Status: AC
  Administered 2020-12-24: 1 g via INTRAVENOUS
  Filled 2020-12-24: qty 100

## 2020-12-24 MED ORDER — INSULIN GLARGINE 100 UNIT/ML ~~LOC~~ SOLN
8.0000 [IU] | Freq: Once | SUBCUTANEOUS | Status: AC
  Administered 2020-12-24: 8 [IU] via SUBCUTANEOUS
  Filled 2020-12-24: qty 0.08

## 2020-12-24 MED ORDER — LACTATED RINGERS IV SOLN: INTRAVENOUS | Status: DC

## 2020-12-24 MED ORDER — CALCIUM GLUCONATE-NACL 1-0.675 GM/50ML-% IV SOLN
1.0000 g | INTRAVENOUS | Status: AC
  Administered 2020-12-24 (×3): 1000 mg via INTRAVENOUS
  Filled 2020-12-24 (×3): qty 50

## 2020-12-24 MED ORDER — POTASSIUM CHLORIDE 10 MEQ/100ML IV SOLN
10.0000 meq | INTRAVENOUS | Status: AC
  Administered 2020-12-24 (×2): 10 meq via INTRAVENOUS
  Filled 2020-12-24 (×2): qty 100

## 2020-12-24 MED ORDER — DEXTROSE IN LACTATED RINGERS 5 % IV SOLN: INTRAVENOUS | Status: DC

## 2020-12-24 MED ORDER — CALCIUM GLUCONATE-NACL 1-0.675 GM/50ML-% IV SOLN
1.0000 g | Freq: Once | INTRAVENOUS | Status: AC
  Administered 2020-12-24: 1000 mg via INTRAVENOUS
  Filled 2020-12-24: qty 50

## 2020-12-24 MED ORDER — INSULIN ASPART 100 UNIT/ML IJ SOLN
0.0000 [IU] | Freq: Three times a day (TID) | INTRAMUSCULAR | Status: DC
  Administered 2020-12-25: 1 [IU] via SUBCUTANEOUS
  Administered 2020-12-26: 5 [IU] via SUBCUTANEOUS
  Administered 2020-12-26: 1 [IU] via SUBCUTANEOUS
  Administered 2020-12-27: 2 [IU] via SUBCUTANEOUS
  Administered 2020-12-27: 1 [IU] via SUBCUTANEOUS
  Administered 2020-12-28 (×3): 3 [IU] via SUBCUTANEOUS
  Administered 2020-12-29: 5 [IU] via SUBCUTANEOUS
  Administered 2020-12-29 – 2020-12-30 (×4): 3 [IU] via SUBCUTANEOUS
  Administered 2020-12-30: 1 [IU] via SUBCUTANEOUS
  Administered 2020-12-31: 2 [IU] via SUBCUTANEOUS
  Administered 2020-12-31: 3 [IU] via SUBCUTANEOUS
  Administered 2020-12-31: 2 [IU] via SUBCUTANEOUS
  Administered 2021-01-01: 7 [IU] via SUBCUTANEOUS
  Administered 2021-01-01 – 2021-01-02 (×4): 3 [IU] via SUBCUTANEOUS
  Administered 2021-01-02 – 2021-01-03 (×2): 5 [IU] via SUBCUTANEOUS
  Administered 2021-01-03: 3 [IU] via SUBCUTANEOUS
  Administered 2021-01-03: 1 [IU] via SUBCUTANEOUS
  Administered 2021-01-04: 3 [IU] via SUBCUTANEOUS
  Administered 2021-01-04: 2 [IU] via SUBCUTANEOUS
  Administered 2021-01-04: 5 [IU] via SUBCUTANEOUS
  Administered 2021-01-05: 2 [IU] via SUBCUTANEOUS
  Administered 2021-01-05 (×2): 5 [IU] via SUBCUTANEOUS
  Administered 2021-01-06 (×2): 1 [IU] via SUBCUTANEOUS
  Administered 2021-01-07: 5 [IU] via SUBCUTANEOUS
  Administered 2021-01-07: 3 [IU] via SUBCUTANEOUS
  Administered 2021-01-07: 1 [IU] via SUBCUTANEOUS
  Administered 2021-01-08 – 2021-01-09 (×4): 2 [IU] via SUBCUTANEOUS
  Administered 2021-01-09 – 2021-01-10 (×2): 3 [IU] via SUBCUTANEOUS
  Administered 2021-01-10: 2 [IU] via SUBCUTANEOUS
  Administered 2021-01-10: 3 [IU] via SUBCUTANEOUS
  Administered 2021-01-11: 2 [IU] via SUBCUTANEOUS
  Administered 2021-01-11: 5 [IU] via SUBCUTANEOUS
  Administered 2021-01-11 – 2021-01-12 (×2): 2 [IU] via SUBCUTANEOUS
  Administered 2021-01-12: 5 [IU] via SUBCUTANEOUS
  Administered 2021-01-12: 3 [IU] via SUBCUTANEOUS
  Administered 2021-01-13: 9 [IU] via SUBCUTANEOUS
  Administered 2021-01-13: 3 [IU] via SUBCUTANEOUS
  Administered 2021-01-13: 2 [IU] via SUBCUTANEOUS
  Administered 2021-01-14: 5 [IU] via SUBCUTANEOUS
  Administered 2021-01-14 – 2021-01-15 (×3): 1 [IU] via SUBCUTANEOUS
  Administered 2021-01-15: 2 [IU] via SUBCUTANEOUS
  Administered 2021-01-16 – 2021-01-17 (×2): 7 [IU] via SUBCUTANEOUS
  Administered 2021-01-17: 1 [IU] via SUBCUTANEOUS
  Administered 2021-01-17: 2 [IU] via SUBCUTANEOUS
  Administered 2021-01-18 (×2): 3 [IU] via SUBCUTANEOUS
  Administered 2021-01-19: 7 [IU] via SUBCUTANEOUS
  Administered 2021-01-19 – 2021-01-20 (×3): 2 [IU] via SUBCUTANEOUS
  Administered 2021-01-20: 3 [IU] via SUBCUTANEOUS
  Administered 2021-01-20: 2 [IU] via SUBCUTANEOUS
  Administered 2021-01-21: 5 [IU] via SUBCUTANEOUS
  Administered 2021-01-21: 2 [IU] via SUBCUTANEOUS
  Administered 2021-01-21: 3 [IU] via SUBCUTANEOUS
  Administered 2021-01-22: 1 [IU] via SUBCUTANEOUS
  Administered 2021-01-22 (×2): 2 [IU] via SUBCUTANEOUS
  Administered 2021-01-23: 3 [IU] via SUBCUTANEOUS
  Administered 2021-01-23: 2 [IU] via SUBCUTANEOUS
  Administered 2021-01-23: 3 [IU] via SUBCUTANEOUS
  Administered 2021-01-24: 5 [IU] via SUBCUTANEOUS
  Administered 2021-01-25: 3 [IU] via SUBCUTANEOUS
  Administered 2021-01-25: 7 [IU] via SUBCUTANEOUS

## 2020-12-24 MED ORDER — MAGNESIUM SULFATE 2 GM/50ML IV SOLN
2.0000 g | Freq: Once | INTRAVENOUS | Status: AC
  Administered 2020-12-24: 2 g via INTRAVENOUS
  Filled 2020-12-24: qty 50

## 2020-12-24 MED ORDER — SODIUM CHLORIDE 0.9 % IV SOLN: 3.0000 g | Freq: Once | INTRAVENOUS | Status: DC

## 2020-12-24 MED ORDER — SODIUM BICARBONATE 8.4 % IV SOLN
INTRAVENOUS | Status: AC
  Filled 2020-12-24: qty 150

## 2020-12-24 NOTE — Progress Notes (Signed)
1851 CBG 137 mg/dL. Lantus 8 units administered subcutaneously per provider order, see eMAR.   2100 Insulin gtt stopped per provider order. Continuous IV fluids changed to LR at 100 mL/hr per provider order, see eMAR. EndoTool discontinued at this time. Patient started on SSI coverage with CBG ACHS. CBG 144 mg/dL.

## 2020-12-24 NOTE — Progress Notes (Signed)
PROGRESS NOTE    [name]  YKZ:[id number] DOB: [birth year] DOA: 12/23/2020 PCP: [name], MD   Brief Narrative:   Mark Jumbo Sr. is a 80 y.o. male with medical history significant for type 2 diabetes, CKD stage IIIb, hypertension, dyslipidemia, diastolic CHF, and CAD described to the ED via EMS with increasing weakness and confusion along with poor appetite for the last 2 to 3 days.  Patient was admitted with AKI and severe acidosis along with hyperglycemia.  Assessment & Plan:   Active Problems:   AKI (acute kidney injury) ([place])   Generalized weakness with acute metabolic encephalopathy secondary to AKI with severe metabolic acidosis and uncompensated respiratory alkalosis -Severe acidosis related to AKI  -Noted to be non-oliguric -No further bicarb per Nephrology and changed to LR -Careful monitoring in stepdown unit with BiPAP support to assist with work of breathing -Mentation improving, try for diet today  Non-oliguric AKI on CKD3b-suspect prerenal with dehydration -Continue on IV fluid with sodium bicarbonate-isotonic -Check urine electrolytes -Renal ultrasound without hydronephrosis -Monitor I's and O's -Avoid nephrotoxic agents -Appreciate nephrology evaluation  Hypocalcemia -Replete and re-evaluate per Nephrology  Hyperglycemia in setting of DM2 with mild DKA -Compliant with home medications according to wife at bedside -Likely stress-induced -Hemoglobin A1c 7.0% -Transition off insulin drip once able to eat  COVID-19 infection -Chest x-ray with stable cardiomegaly and no other signs of infiltrate -Start on remdesivir -No need for steroids given no hypoxemia, and would be high risk given hyperglycemia -Isolation precautions  Thrombocytopenia-stable -Continue to monitor closely -Avoid heparin agents  Hypertension/CAD -Hold home medications for now until blood pressure improves and patient can tolerate p.o. intake   DVT  prophylaxis:SCDs Code Status: Full Family Communication: Tried calling wife with no response 5/22 Disposition Plan:  Status is: Inpatient  Remains inpatient appropriate because:Altered mental status, IV treatments appropriate due to intensity of illness or inability to take PO and Inpatient level of care appropriate due to severity of illness   Dispo: The patient is from: Home              Anticipated d/c is to: Home              Patient currently is not medically stable to d/c.   Difficult to place patient No   Consultants:   Nephrology  Procedures:   See below  Antimicrobials:  Anti-infectives (From admission, onward)   Start     Dose/Rate Route Frequency Ordered Stop   12/24/20 1000  remdesivir 100 mg in sodium chloride 0.9 % 100 mL IVPB        100 mg 200 mL/hr over 30 Minutes Intravenous Daily 12/23/20 1637 12/28/20 0959   12/23/20 1645  remdesivir 100 mg in sodium chloride 0.9 % 100 mL IVPB        100 mg 200 mL/hr over 30 Minutes Intravenous Every 1 hr x 2 12/23/20 1636 12/23/20 1908       Subjective: Patient seen and evaluated today with improvements in acidosis noted.  He appears to be more coherent and responsive today.  No acute overnight events noted.  Objective: Vitals:   12/24/20 1000 12/24/20 1100 12/24/20 1200 12/24/20 1230  BP: 127/85 (!) 119/57 (!) 142/77   Pulse: (!) 109 (!) 102 (!) 109 80  Resp: (!) 25 17 (!) 26 18  Temp:      TempSrc:      SpO2: 99% 98% 99% 99%  Weight:  Intake/Output Summary (Last 24 hours) at 12/24/2020 1336 Last data filed at 12/24/2020 1117 Gross per 24 hour  Intake 2170.38 ml  Output 850 ml  Net 1320.38 ml   Filed Weights   12/23/20 2300  Weight: 87.1 kg    Examination:  General exam: Appears groggy/somnolent, but arousable Respiratory system: Clear to auscultation. Respiratory effort normal.  Currently on room air. Cardiovascular system: S1 & S2 heard, RRR.  Gastrointestinal system: Abdomen is  soft Central nervous system: As above Extremities: No edema Skin: No significant lesions noted Psychiatry: Flat affect.    Data Reviewed: I have personally reviewed following labs and imaging studies  CBC: Recent Labs  Lab 12/23/20 1153 12/24/20 0440  WBC 10.5 10.4  NEUTROABS 8.9*  --   HGB 9.3* 8.5*  HCT 28.8* 25.3*  MCV 97.0 92.7  PLT 126* 425*   Basic Metabolic Panel: Recent Labs  Lab 12/23/20 1153 12/23/20 2056 12/24/20 0440 12/24/20 0853  NA 137 141 143 141  K 4.4 3.6 3.4* 3.3*  CL 114* 114* 113* 111  CO2 7* 11* 13* 14*  GLUCOSE 321* 84 135* 148*  BUN 124* 120* 113* 99*  CREATININE 6.87* 6.37* 6.29* 6.34*  CALCIUM 5.8* 5.8* 5.5* 5.2*  MG  --   --  1.2*  --    GFR: Estimated Creatinine Clearance: 10.2 mL/min (A) (by C-G formula based on SCr of 6.34 mg/dL (H)). Liver Function Tests: Recent Labs  Lab 12/23/20 1153 12/24/20 0440  AST 14* 16  ALT 27 23  ALKPHOS 101 88  BILITOT 0.5 0.4  PROT 5.7* 5.0*  ALBUMIN 2.5* 2.3*   Recent Labs  Lab 12/23/20 1153  LIPASE 21   No results for input(s): AMMONIA in the last 168 hours. Coagulation Profile: No results for input(s): INR, PROTIME in the last 168 hours. Cardiac Enzymes: No results for input(s): CKTOTAL, CKMB, CKMBINDEX, TROPONINI in the last 168 hours. BNP (last 3 results) No results for input(s): PROBNP in the last 8760 hours. HbA1C: Recent Labs    12/23/20 1203  HGBA1C 7.0*   CBG: Recent Labs  Lab 12/24/20 0910 12/24/20 1004 12/24/20 1112 12/24/20 1159 12/24/20 1315  GLUCAP 143* 140* 119* 122* 109*   Lipid Profile: No results for input(s): CHOL, HDL, LDLCALC, TRIG, CHOLHDL, LDLDIRECT in the last 72 hours. Thyroid Function Tests: Recent Labs    12/23/20 1558  TSH 2.262   Anemia Panel: Recent Labs    12/24/20 0440  FERRITIN 98   Sepsis Labs: Recent Labs  Lab 12/23/20 1510 12/23/20 1728  LATICACIDVEN 1.1 0.8    Recent Results (from the past 240 hour(s))  Resp Panel by  RT-PCR (Flu A&B, Covid) Nasopharyngeal Swab     Status: Abnormal   Collection Time: 12/23/20 12:06 PM   Specimen: Nasopharyngeal Swab; Nasopharyngeal(NP) swabs in vial transport medium  Result Value Ref Range Status   SARS Coronavirus 2 by RT PCR POSITIVE (A) NEGATIVE Final    Comment: CRITICAL RESULT CALLED TO, READ BACK BY AND VERIFIED WITH: OAKLEY,B 1323 12/23/2020 COLEMAN,R (NOTE) SARS-CoV-2 target nucleic acids are DETECTED.  The SARS-CoV-2 RNA is generally detectable in upper respiratory specimens during the acute phase of infection. Positive results are indicative of the presence of the identified virus, but do not rule out bacterial infection or co-infection with other pathogens not detected by the test. Clinical correlation with patient history and other diagnostic information is necessary to determine patient infection status. The expected result is Negative.  Fact Sheet for Patients: EntrepreneurPulse.com.au  Fact Sheet for Healthcare Providers: IncredibleEmployment.be  This test is not yet approved or cleared by the [place] FDA and  has been authorized for detection and/or diagnosis of SARS-CoV-2 by FDA under an Emergency Use Authorization (EUA).  This EUA will remain in effect (meaning this t est can be used) for the duration of  the COVID-19 declaration under Section 564(b)(1) of the Act, 21 U.S.C. section 360bbb-3(b)(1), unless the authorization is terminated or revoked sooner.     Influenza A by PCR NEGATIVE NEGATIVE Final   Influenza B by PCR NEGATIVE NEGATIVE Final    Comment: (NOTE) The Xpert Xpress SARS-CoV-2/FLU/RSV plus assay is intended as an aid in the diagnosis of influenza from Nasopharyngeal swab specimens and should not be used as a sole basis for treatment. Nasal washings and aspirates are unacceptable for Xpert Xpress SARS-CoV-2/FLU/RSV testing.  Fact Sheet for  Patients: EntrepreneurPulse.com.au  Fact Sheet for Healthcare Providers: IncredibleEmployment.be  This test is not yet approved or cleared by the [place] FDA and has been authorized for detection and/or diagnosis of SARS-CoV-2 by FDA under an Emergency Use Authorization (EUA). This EUA will remain in effect (meaning this test can be used) for the duration of the COVID-19 declaration under Section 564(b)(1) of the Act, 21 U.S.C. section 360bbb-3(b)(1), unless the authorization is terminated or revoked.  Performed at [facility], [address] [place], [place] [address]   MRSA PCR Screening     Status: None   Collection Time: 12/23/20  9:59 PM   Specimen: Nasal Mucosa; Nasopharyngeal  Result Value Ref Range Status   MRSA by PCR NEGATIVE NEGATIVE Final    Comment:        The GeneXpert MRSA Assay (FDA approved for NASAL specimens only), is one component of a comprehensive MRSA colonization surveillance program. It is not intended to diagnose MRSA infection nor to guide or monitor treatment for MRSA infections. Performed at [facility], [address], [place], [place] [address]          Radiology Studies: CT Head Wo Contrast  Result Date: 12/23/2020 CLINICAL DATA:  Mental status changes EXAM: CT HEAD WITHOUT CONTRAST TECHNIQUE: Contiguous axial images were obtained from the base of the skull through the vertex without intravenous contrast. COMPARISON:  12/04/2018 FINDINGS: Brain: No acute intracranial abnormality. Specifically, no hemorrhage, hydrocephalus, mass lesion, acute infarction, or significant intracranial injury. There is atrophy and chronic small vessel disease changes. Vascular: No hyperdense vessel or unexpected calcification. Skull: No acute calvarial abnormality. Sinuses/Orbits: No acute findings Other: None IMPRESSION: Atrophy, chronic microvascular disease. No acute intracranial abnormality. Electronically Signed   By:  Rolm Baptise M.D.   On: 12/23/2020 12:54   US RENAL  Result Date: 12/24/2020 CLINICAL DATA:  80 year old male with acute renal insufficiency. EXAM: RENAL / URINARY TRACT ULTRASOUND COMPLETE COMPARISON:  Abdomen ultrasound 12/01/2017. CT Abdomen and Pelvis 03/20/2013. FINDINGS: Right Kidney: Renal measurements: 11.8 x 5.3 x 7.5 cm = volume: 247 mL. Cortical echogenicity within normal limits. No hydronephrosis. Extrarenal pelvis is stable (normal variant). No right renal mass. Left Kidney: Renal measurements: 12.9 x 5.6 x 5.9 cm = volume: 221 mL. Cortical echogenicity within normal limits. Lower pole curvilinear hypoechogenicity on image 29 probably corresponds to chronic perinephric stranding seen in 2014. No left hydronephrosis today. Possible punctate chronic lower pole calculus on image 38, stable since 2014. Bladder: Decompressed and poorly visible, reportedly a Foley catheter is in place. Other: Echogenic liver on image 2. IMPRESSION: 1. No hydronephrosis or acute renal  finding. Chronic left lower pole nephrolithiasis. 2. Evidence of fatty liver disease. Electronically Signed   By: Genevie Ann M.D.   On: 12/24/2020 11:18   DG Chest Portable 1 View  Result Date: 12/23/2020 CLINICAL DATA:  Weakness. COVID 2 weeks ago. EXAM: PORTABLE CHEST 1 VIEW COMPARISON:  Two-view chest x-ray 12/03/2018 FINDINGS: Heart is chronically enlarged. Chronic elevation of the right hemidiaphragm noted. No edema or effusion is present. No focal airspace disease is present. Axial skeleton is unremarkable. IMPRESSION: No acute cardiopulmonary disease or significant interval change. Stable cardiomegaly without failure. Electronically Signed   By: San Morelle M.D.   On: 12/23/2020 12:33        Scheduled Meds: . Chlorhexidine Gluconate Cloth  6 each Topical Daily  . mouth rinse  15 mL Mouth Rinse BID   Continuous Infusions: . calcium gluconate 1,000 mg (12/24/20 1230)  . insulin 0.6 Units/hr (12/24/20 1211)  .  lactated ringers    . magnesium sulfate bolus IVPB 2 g (12/24/20 1323)  . remdesivir 100 mg in NS 100 mL Stopped (12/24/20 1116)     LOS: 1 day    Time spent: 35 minutes    Nadra Hritz D Manuella Ghazi, DO [facility]  If 7PM-7AM, please contact night-coverage [email or web address] 12/24/2020, 1:36 PM

## 2020-12-24 NOTE — Progress Notes (Signed)
Date and time results received: 12/24/20  1406   Test: Calcium     Critical Value: Ca 5.7  Name of Provider Notified: Manuella Ghazi. MD  Orders Received? Or Actions Taken?: Orders Received - See Orders for details

## 2020-12-24 NOTE — Consult Note (Signed)
Nephrology Consult   Requesting provider: [place] requesting consult: Hospitalist Reason for consult: AKI on CKD 3b, hypocalcemia, acidosis   Assessment/Recommendations: Mark Jumbo Sr. is a/an 80 y.o. male with a past medical history DM2, CKD 3b, HTN, HLD, CHF, CAD who present w/ confusion and AKI  Non-Oliguric AKI on CKD 3b: BL 1.7-2. AKI likely secondary to ATN from hypotension associated w/ acute illness and dehydration. No hydro on RUS today but did have good amount of urine in bag today. Hopeful for recovery soon -Continue foley catheter for now -LR 100cc/hr for 1 day; hold for hypoxia -Continue to monitor daily Cr, Dose meds for GFR -Monitor Daily I/Os, Daily weight  -Maintain MAP>65 for optimal renal perfusion.  -Avoid nephrotoxic medications including NSAIDs and Vanc/Zosyn combo -Hold dialysis at this time given potential recovery  NAGMA/AGMA: mild AG at 16 2/2 renal failure largely non-anion gap. Careful with excessive bicarb repletion given hypocalcemia and hypokalemia -Hold any IV bicarb for now -Correct hypokalemia and hypocalcemia -LR should help some with acidosis -Repeat BMP in the afternoon  Hypokalemia: Continue with IV and oral repletion as needed  Hypocalcemia: Will need extensive repletion.  Received 1 g today.  Ordered 3 more grams.  Hypertension: Hold home medications  Uncontrolled Diabetes Mellitus Type 2 with Hyperglycemia: Management per primary  Weakness/confusion: Likely related to toxic metabolic encephalopathy.  Given hopeful recovery of kidney function holding off on empiric dialysis for now.  Continue to monitor closely.   Recommendations conveyed to primary service.    [facility] 12/24/2020 11:20 AM   _____________________________________________________________________________________ CC: AKI on CKD 3b  History of Present Illness: Mark L Mark Sr. is a/an 80 y.o. male with a past  medical history of DM2, CKD 3b, HTN, HLD, CHF, CAD who presents with weakness and confusion.  Patient has had poor appetite, confusion, weakness for several days prior to presentation.  He was diagnosed with COVID on 5/7.  He saw his PCP on 5/19 for the worsening symptoms of confusion.  He was also noted to be hypoxic at that time.  It was recommended that he hold his Lasix and drink fluids but he has had difficulty drinking significant amount of fluids.  Because of his progression of symptoms he was brought to the emergency department for further evaluation.  In the emergency department he was noted to be initially hypotensive but was given fluids and blood pressure improved.  CT head without any significant abnormalities.  Labs demonstrated markedly elevated creatinine of 6.9 with a baseline around 1.7-2.  Calcium was also noted to be significantly low at 5.8.  Since admission the patient has not had documented urine output.  However, this morning I placed order for Foley catheter and when I visited him at the bedside he has significant amount of clear yellow urine in his Foley bag.  Urinalysis was rather bland with no significant protein or cellularity.  Urine sodium was 47.  Patient also noted to have bicarb of 11 yesterday that improved to 14 today. Calcium remains very low at 5.2.  Medications:  Current Facility-Administered Medications  Medication Dose Route Frequency Provider Last Rate Last Admin  . acetaminophen (TYLENOL) tablet 650 mg  650 mg Oral Q6H PRN Manuella Ghazi, Pratik D, DO       Or  . acetaminophen (TYLENOL) suppository 650 mg  650 mg Rectal Q6H PRN Manuella Ghazi, Pratik D, DO      . calcium gluconate 1 g/ 50 mL sodium chloride IVPB  1 g Intravenous Q1 Hr x 3 Hall, Scott A, RPH 50 mL/hr at 12/24/20 1117 1,000 mg at 12/24/20 1117  . Chlorhexidine Gluconate Cloth 2 % PADS 6 each  6 each Topical Daily Heath Lark D, DO   6 each at 12/24/20 0950  . dextrose 50 % solution 0-50 mL  0-50 mL Intravenous  PRN Manuella Ghazi, Pratik D, DO      . insulin regular, human (MYXREDLIN) 100 units/ 100 mL infusion   Intravenous Continuous Shah, Pratik D, DO 0.2 mL/hr at 12/24/20 1115 0.2 Units/hr at 12/24/20 1115  . magnesium sulfate IVPB 2 g 50 mL  2 g Intravenous Once Manuella Ghazi, Pratik D, DO      . MEDLINE mouth rinse  15 mL Mouth Rinse BID Manuella Ghazi, Pratik D, DO   15 mL at 12/24/20 0915  . ondansetron (ZOFRAN) tablet 4 mg  4 mg Oral Q6H PRN Manuella Ghazi, Pratik D, DO       Or  . ondansetron (ZOFRAN) injection 4 mg  4 mg Intravenous Q6H PRN Manuella Ghazi, Pratik D, DO      . remdesivir 100 mg in sodium chloride 0.9 % 100 mL IVPB  100 mg Intravenous Daily Ena Dawley, RPH   Stopped at 12/24/20 1116     ALLERGIES Enalapril maleate  MEDICAL HISTORY Past Medical History:  Diagnosis Date  . Arthritis    lower back  . Chronic back pain    Disc disease  . Chronic kidney disease   . Coronary atherosclerosis of native coronary artery    Coronary calcifications by chest CT, Myoview demonstrating inferolateral scar  . Deafness in right ear   . Diabetes mellitus   . Diabetic retinopathy ([place])    moderate nonproliferative with macular edema in right eye  . Diastolic dysfunction 01/8126   Grade 1.  . Essential hypertension, benign   . Heart murmur    in past  . HOH (hard of hearing)   . Hyperkalemia   . Kidney stones   . Mixed hyperlipidemia   . NAFLD (nonalcoholic fatty liver disease)   . Pancreatic insufficiency    Diagnosed at [facility]  . Prostate cancer ([place]) 2011  . Shortness of breath dyspnea    occasional - liver pressing on right lung, decreased capacity  . Subdural hematoma ([place])   . Type 2 diabetes mellitus ([place])   . Wears hearing aid    "crossover" form right to left     SOCIAL HISTORY Social History   Socioeconomic History  . Marital status: Married    Spouse name: Not on file  . Number of children: Not on file  . Years of education: Not on file  . Highest education level: Not on file  Occupational History   . Not on file  Tobacco Use  . Smoking status: Never Smoker  . Smokeless tobacco: Never Used  Substance and Sexual Activity  . Alcohol use: No  . Drug use: No  . Sexual activity: Not on file  Other Topics Concern  . Not on file  Social History Narrative  . Not on file   Social Determinants of Health   Financial Resource Strain: Not on file  Food Insecurity: Not on file  Transportation Needs: Not on file  Physical Activity: Not on file  Stress: Not on file  Social Connections: Not on file  Intimate Partner Violence: Not on file     FAMILY HISTORY Family History  Problem Relation Age of Onset  . Diabetes type II Mother   .  Heart attack Father       Review of Systems: 12 systems reviewed Otherwise as per HPI, all other systems reviewed and negative  Physical Exam: Vitals:   12/24/20 0800 12/24/20 0900  BP: 132/68 115/69  Pulse: (!) 106 76  Resp: (!) 23   Temp: 98.3 F (36.8 C)   SpO2: 99% 98%   No intake/output data recorded.  Intake/Output Summary (Last 24 hours) at 12/24/2020 1120 Last data filed at 12/23/2020 1908 Gross per 24 hour  Intake 2670.38 ml  Output --  Net 2670.38 ml   General: Confused, tired, no distress HEENT: anicteric sclera, dry mucosa CV: Normal rate, no audible murmur, Lungs: Bilateral chest rise with mild tachypnea and no increased work of breathing Abd: soft, non-tender, non-distended Skin: no visible lesions or rashes Psych: alert, engaged, appropriate mood and affect Musculoskeletal: no obvious deformities, no edema Neuro: Tired and minimally interactive, no obvious focal deficit  Test Results Reviewed Lab Results  Component Value Date   NA 141 12/24/2020   K 3.3 (L) 12/24/2020   CL 111 12/24/2020   CO2 14 (L) 12/24/2020   BUN PENDING 12/24/2020   CREATININE 6.34 (H) 12/24/2020   GFR 65.09 01/29/2017   CALCIUM 5.2 (LL) 12/24/2020   ALBUMIN 2.3 (L) 12/24/2020   PHOS 2.1 (L) 12/04/2018     I have reviewed all  relevant outside healthcare records related to the patient's current hospitalization

## 2020-12-25 ENCOUNTER — Encounter (HOSPITAL_COMMUNITY): Payer: Self-pay | Admitting: Internal Medicine

## 2020-12-25 DIAGNOSIS — N179 Acute kidney failure, unspecified: Secondary | ICD-10-CM | POA: Diagnosis not present

## 2020-12-25 LAB — GLUCOSE, CAPILLARY
Glucose-Capillary: 84 mg/dL (ref 70–99)
Glucose-Capillary: 81 mg/dL (ref 70–99)
Glucose-Capillary: 146 mg/dL — ABNORMAL HIGH (ref 70–99)
Glucose-Capillary: 109 mg/dL — ABNORMAL HIGH (ref 70–99)

## 2020-12-25 LAB — RENAL FUNCTION PANEL
Albumin: 2.2 g/dL — ABNORMAL LOW (ref 3.5–5.0)
Anion gap: 16 — ABNORMAL HIGH (ref 5–15)
BUN: 114 mg/dL — ABNORMAL HIGH (ref 8–23)
CO2: 16 mmol/L — ABNORMAL LOW (ref 22–32)
Calcium: 5.9 mg/dL — CL (ref 8.9–10.3)
Chloride: 112 mmol/L — ABNORMAL HIGH (ref 98–111)
Creatinine, Ser: 5.88 mg/dL — ABNORMAL HIGH (ref 0.61–1.24)
GFR, Estimated: 9 mL/min — ABNORMAL LOW (ref >60)
Glucose, Bld: 85 mg/dL (ref 70–99)
Phosphorus: 9.2 mg/dL — ABNORMAL HIGH (ref 2.5–4.6)
Potassium: 3.3 mmol/L — ABNORMAL LOW (ref 3.5–5.1)
Sodium: 144 mmol/L (ref 135–145)

## 2020-12-25 LAB — URINE CULTURE: Culture: <10000 — AB

## 2020-12-25 LAB — CBC
HCT: 26.1 % — ABNORMAL LOW (ref 39.0–52.0)
Hemoglobin: 8.7 g/dL — ABNORMAL LOW (ref 13.0–17.0)
MCH: 30.7 pg (ref 26.0–34.0)
MCHC: 33.3 g/dL (ref 30.0–36.0)
MCV: 92.2 fL (ref 80.0–100.0)
Platelets: 141 10*3/uL — ABNORMAL LOW (ref 150–400)
RBC: 2.83 MIL/uL — ABNORMAL LOW (ref 4.22–5.81)
RDW: 14.6 % (ref 11.5–15.5)
WBC: 9.4 10*3/uL (ref 4.0–10.5)
nRBC: 0 % (ref 0.0–0.2)

## 2020-12-25 LAB — FERRITIN: Ferritin: 99 ng/mL (ref 24–336)

## 2020-12-25 LAB — C-REACTIVE PROTEIN: CRP: 4.4 mg/dL — ABNORMAL HIGH (ref <1.0)

## 2020-12-25 LAB — MAGNESIUM: Magnesium: 1.5 mg/dL — ABNORMAL LOW (ref 1.7–2.4)

## 2020-12-25 MED ORDER — CALCIUM GLUCONATE-NACL 1-0.675 GM/50ML-% IV SOLN
1.0000 g | INTRAVENOUS | Status: AC
  Administered 2020-12-25 (×2): 1000 mg via INTRAVENOUS

## 2020-12-25 MED ORDER — CALCIUM GLUCONATE-NACL 2-0.675 GM/100ML-% IV SOLN
2.0000 g | Freq: Once | INTRAVENOUS | Status: DC
  Filled 2020-12-25: qty 100

## 2020-12-25 MED ORDER — CALCIUM GLUCONATE-NACL 1-0.675 GM/50ML-% IV SOLN
1.0000 g | INTRAVENOUS | Status: AC
  Filled 2020-12-25: qty 50

## 2020-12-25 MED ORDER — MAGNESIUM SULFATE 2 GM/50ML IV SOLN
2.0000 g | Freq: Once | INTRAVENOUS | Status: AC
  Administered 2020-12-25: 2 g via INTRAVENOUS
  Filled 2020-12-25: qty 50

## 2020-12-25 MED ORDER — POTASSIUM CHLORIDE 10 MEQ/100ML IV SOLN
10.0000 meq | INTRAVENOUS | Status: AC
  Administered 2020-12-25 (×2): 10 meq via INTRAVENOUS
  Filled 2020-12-25 (×2): qty 100

## 2020-12-25 NOTE — Progress Notes (Signed)
Patient had desaturation to 70s. RN placed on 4 lpm nasal cannula while RT went to get bipap machine. Once RT returned patient was 99% on 4 lpm. Patient in no distress at this time. Vent still clean but left outside of room if needed. RN made aware and agrees with plan. Will call RT if patient needs to go on bipap.

## 2020-12-25 NOTE — Progress Notes (Signed)
PROGRESS NOTE    [name]  MVE:[id number] DOB: 08/06/1940 DOA: 12/23/2020 PCP: [name], MD   Brief Narrative:   Mark Dicksonis a 80 y.o.malewith medical history significant fortype 2 diabetes, CKD stage IIIb, hypertension, dyslipidemia, diastolic CHF, and CAD described to the ED via EMS with increasing weakness and confusion along with poor appetite for the last 2 to 3 days.  Patient was admitted with AKI and severe acidosis along with hyperglycemia.  Assessment & Plan:   Active Problems:   AKI (acute kidney injury) ([facility])   Generalized weakness with acute metabolic encephalopathy secondary to AKI with severe metabolic acidosis and uncompensated respiratory alkalosis -Severe acidosis related to AKIimproving -Careful monitoring in stepdown unit, but with poor mentation still  Non-oliguric AKIon CKD3b-suspect prerenal with dehydration -Slowly improving -Continue on IV fluidwith sodium bicarbonate-isotonic -Renal ultrasound without hydronephrosis -Monitor I's and O's -Avoid nephrotoxic agents -Appreciate ongoing nephrology evaluation; hopefully can avoid HD  Acute metabolic encephalopathy secondary to above -Likely uremic -Continue to monitor with treatments ongoing  Hypocalcemia -Replete and re-evaluate in am  Hypomagnesemia -Replete and re-evaluate in am  Hypokalemia -Continue to monitor per Nephrology  Hyperglycemiain setting of DM2 with mild DKA-resolved -Compliant with home medications according to wife at bedside -Likely stress-induced -Hemoglobin A1c 7.0% -Transitioned off insulin drip on 5/22, now on SSIe  COVID-19 infection -Chest x-ray with stable cardiomegaly and no other signs of infiltrate -Started on remdesivir -No need for steroids given no hypoxemia,and would be high risk given hyperglycemia -Isolation precautions  Thrombocytopenia-stable -Continue to monitor closely -Avoid heparin  agents  Hypertension/CAD -Hold home medications for now until mentation improves  Hard of hearing -Hearing aids   DVT prophylaxis:SCDs Code Status: Full Family Communication: Called wife with on 5/23 Disposition Plan:  Status is: Inpatient  Remains inpatient appropriate because:Altered mental status, IV treatments appropriate due to intensity of illness or inability to take PO and Inpatient level of care appropriate due to severity of illness   Dispo: The patient is from: Home  Anticipated d/c is to: Home  Patient currently is not medically stable to d/c.              Difficult to place patient No   Consultants:   Nephrology  Procedures:   See below  Antimicrobials:  Anti-infectives (From admission, onward)   Start     Dose/Rate Route Frequency Ordered Stop   12/24/20 1000  remdesivir 100 mg in sodium chloride 0.9 % 100 mL IVPB        100 mg 200 mL/hr over 30 Minutes Intravenous Daily 12/23/20 1637 12/28/20 0959   12/23/20 1645  remdesivir 100 mg in sodium chloride 0.9 % 100 mL IVPB        100 mg 200 mL/hr over 30 Minutes Intravenous Every 1 hr x 2 12/23/20 1636 12/23/20 1908       Subjective: Patient seen and evaluated today with ongoing confusion noted this morning.  He was noted to be more alert and awake yesterday and was able to have some of his meals.  No acute overnight events.  Objective: Vitals:   12/25/20 0600 12/25/20 0800 12/25/20 0900 12/25/20 1000  BP: (!) 142/58 135/67 (!) 147/73 (!) 153/60  Pulse: 66 88 100 72  Resp: 14 15 15 15   Temp:  97.8 F (36.6 C)    TempSrc:  Axillary    SpO2: 99% 99% 99% 99%  Weight:        Intake/Output Summary (Last 24  hours) at 12/25/2020 1030 Last data filed at 12/25/2020 0600 Gross per 24 hour  Intake 1914.44 ml  Output 2050 ml  Net -135.56 ml   Filed Weights   12/23/20 2300  Weight: 87.1 kg    Examination:  General exam: Appears lethargic and somnolent, but  arousable, hard of hearing Respiratory system: Clear to auscultation. Respiratory effort normal. Cardiovascular system: S1 & S2 heard, RRR.  Gastrointestinal system: Abdomen is soft Central nervous system: Lethargic and somnolent Extremities: No edema Skin: No significant lesions noted Psychiatry: Cannot be assessed    Data Reviewed: I have personally reviewed following labs and imaging studies  CBC: Recent Labs  Lab 12/23/20 1153 12/24/20 0440 12/25/20 0545  WBC 10.5 10.4 9.4  NEUTROABS 8.9*  --   --   HGB 9.3* 8.5* 8.7*  HCT 28.8* 25.3* 26.1*  MCV 97.0 92.7 92.2  PLT 126* 147* 725*   Basic Metabolic Panel: Recent Labs  Lab 12/24/20 0440 12/24/20 0853 12/24/20 1307 12/24/20 1727 12/25/20 0545  NA 143 141 140 142  141 144  K 3.4* 3.3* 3.4* 3.3*  3.2* 3.3*  CL 113* 111 109 110  109 112*  CO2 13* 14* 14* 17*  15* 16*  GLUCOSE 135* 148* 114* 140*  140* 85  BUN 113* 99* 112* 110*  112* 114*  CREATININE 6.29* 6.34* 6.10* 5.94*  5.94* 5.88*  CALCIUM 5.5* 5.2* 5.7* 5.8*  5.8* 5.9*  MG 1.2*  --   --   --  1.5*  PHOS  --   --   --  9.0* 9.2*   GFR: Estimated Creatinine Clearance: 11 mL/min (A) (by C-G formula based on SCr of 5.88 mg/dL (H)). Liver Function Tests: Recent Labs  Lab 12/23/20 1153 12/24/20 0440 12/24/20 1727 12/25/20 0545  AST 14* 16  --   --   ALT 27 23  --   --   ALKPHOS 101 88  --   --   BILITOT 0.5 0.4  --   --   PROT 5.7* 5.0*  --   --   ALBUMIN 2.5* 2.3* 2.2* 2.2*   Recent Labs  Lab 12/23/20 1153  LIPASE 21   No results for input(s): AMMONIA in the last 168 hours. Coagulation Profile: No results for input(s): INR, PROTIME in the last 168 hours. Cardiac Enzymes: No results for input(s): CKTOTAL, CKMB, CKMBINDEX, TROPONINI in the last 168 hours. BNP (last 3 results) No results for input(s): PROBNP in the last 8760 hours. HbA1C: Recent Labs    12/23/20 1203  HGBA1C 7.0*   CBG: Recent Labs  Lab 12/24/20 1645  12/24/20 1821 12/24/20 1929 12/24/20 2025 12/25/20 0757  GLUCAP 143* 137* 156* 144* 84   Lipid Profile: No results for input(s): CHOL, HDL, LDLCALC, TRIG, CHOLHDL, LDLDIRECT in the last 72 hours. Thyroid Function Tests: Recent Labs    12/23/20 1558  TSH 2.262   Anemia Panel: Recent Labs    12/24/20 0440 12/25/20 0545  FERRITIN 98 99   Sepsis Labs: Recent Labs  Lab 12/23/20 1510 12/23/20 1728  LATICACIDVEN 1.1 0.8    Recent Results (from the past 240 hour(s))  Resp Panel by RT-PCR (Flu A&B, Covid) Nasopharyngeal Swab     Status: Abnormal   Collection Time: 12/23/20 12:06 PM   Specimen: Nasopharyngeal Swab; Nasopharyngeal(NP) swabs in vial transport medium  Result Value Ref Range Status   SARS Coronavirus 2 by RT PCR POSITIVE (A) NEGATIVE Final    Comment: CRITICAL RESULT CALLED TO, READ BACK BY  AND VERIFIED WITH: OAKLEY,B 1323 12/23/2020 COLEMAN,R (NOTE) SARS-CoV-2 target nucleic acids are DETECTED.  The SARS-CoV-2 RNA is generally detectable in upper respiratory specimens during the acute phase of infection. Positive results are indicative of the presence of the identified virus, but do not rule out bacterial infection or co-infection with other pathogens not detected by the test. Clinical correlation with patient history and other diagnostic information is necessary to determine patient infection status. The expected result is Negative.  Fact Sheet for Patients: EntrepreneurPulse.com.au  Fact Sheet for Healthcare Providers: IncredibleEmployment.be  This test is not yet approved or cleared by the [place] FDA and  has been authorized for detection and/or diagnosis of SARS-CoV-2 by FDA under an Emergency Use Authorization (EUA).  This EUA will remain in effect (meaning this t est can be used) for the duration of  the COVID-19 declaration under Section 564(b)(1) of the Act, 21 U.S.C. section 360bbb-3(b)(1), unless the  authorization is terminated or revoked sooner.     Influenza A by PCR NEGATIVE NEGATIVE Final   Influenza B by PCR NEGATIVE NEGATIVE Final    Comment: (NOTE) The Xpert Xpress SARS-CoV-2/FLU/RSV plus assay is intended as an aid in the diagnosis of influenza from Nasopharyngeal swab specimens and should not be used as a sole basis for treatment. Nasal washings and aspirates are unacceptable for Xpert Xpress SARS-CoV-2/FLU/RSV testing.  Fact Sheet for Patients: EntrepreneurPulse.com.au  Fact Sheet for Healthcare Providers: IncredibleEmployment.be  This test is not yet approved or cleared by the [place] FDA and has been authorized for detection and/or diagnosis of SARS-CoV-2 by FDA under an Emergency Use Authorization (EUA). This EUA will remain in effect (meaning this test can be used) for the duration of the COVID-19 declaration under Section 564(b)(1) of the Act, 21 U.S.C. section 360bbb-3(b)(1), unless the authorization is terminated or revoked.  Performed at [facility], [address] [place], [place] [address]   Urine culture     Status: Abnormal   Collection Time: 12/23/20  6:28 PM   Specimen: Urine, Clean Catch  Result Value Ref Range Status   Specimen Description   Final    URINE, CLEAN CATCH Performed at [facility], [address] [place], [place] [address]    Special Requests   Final    NONE Performed at [facility], [address] [place], [place] [address]    Culture (A)  Final    <10,000 COLONIES/mL INSIGNIFICANT GROWTH Performed at [facility], [place] [address], [place], [place] [address]    Report Status 12/25/2020 FINAL  Final  MRSA PCR Screening     Status: None   Collection Time: 12/23/20  9:59 PM   Specimen: Nasal Mucosa; Nasopharyngeal  Result Value Ref Range Status   MRSA by PCR NEGATIVE NEGATIVE Final    Comment:        The GeneXpert MRSA Assay (FDA approved for NASAL specimens only), is one  component of a comprehensive MRSA colonization surveillance program. It is not intended to diagnose MRSA infection nor to guide or monitor treatment for MRSA infections. Performed at [facility], [address], [place], [place] [address]          Radiology Studies: CT Head Wo Contrast  Result Date: 12/23/2020 CLINICAL DATA:  Mental status changes EXAM: CT HEAD WITHOUT CONTRAST TECHNIQUE: Contiguous axial images were obtained from the base of the skull through the vertex without intravenous contrast. COMPARISON:  12/04/2018 FINDINGS: Brain: No acute intracranial abnormality. Specifically, no hemorrhage, hydrocephalus, mass  lesion, acute infarction, or significant intracranial injury. There is atrophy and chronic small vessel disease changes. Vascular: No hyperdense vessel or unexpected calcification. Skull: No acute calvarial abnormality. Sinuses/Orbits: No acute findings Other: None IMPRESSION: Atrophy, chronic microvascular disease. No acute intracranial abnormality. Electronically Signed   By: Rolm Baptise M.D.   On: 12/23/2020 12:54   US RENAL  Result Date: 12/24/2020 CLINICAL DATA:  80 year old male with acute renal insufficiency. EXAM: RENAL / URINARY TRACT ULTRASOUND COMPLETE COMPARISON:  Abdomen ultrasound 12/01/2017. CT Abdomen and Pelvis 03/20/2013. FINDINGS: Right Kidney: Renal measurements: 11.8 x 5.3 x 7.5 cm = volume: 247 mL. Cortical echogenicity within normal limits. No hydronephrosis. Extrarenal pelvis is stable (normal variant). No right renal mass. Left Kidney: Renal measurements: 12.9 x 5.6 x 5.9 cm = volume: 221 mL. Cortical echogenicity within normal limits. Lower pole curvilinear hypoechogenicity on image 29 probably corresponds to chronic perinephric stranding seen in 2014. No left hydronephrosis today. Possible punctate chronic lower pole calculus on image 38, stable since 2014. Bladder: Decompressed and poorly visible, reportedly a Foley catheter is in place. Other:  Echogenic liver on image 2. IMPRESSION: 1. No hydronephrosis or acute renal finding. Chronic left lower pole nephrolithiasis. 2. Evidence of fatty liver disease. Electronically Signed   By: Genevie Ann M.D.   On: 12/24/2020 11:18   DG Chest Portable 1 View  Result Date: 12/23/2020 CLINICAL DATA:  Weakness. COVID 2 weeks ago. EXAM: PORTABLE CHEST 1 VIEW COMPARISON:  Two-view chest x-ray 12/03/2018 FINDINGS: Heart is chronically enlarged. Chronic elevation of the right hemidiaphragm noted. No edema or effusion is present. No focal airspace disease is present. Axial skeleton is unremarkable. IMPRESSION: No acute cardiopulmonary disease or significant interval change. Stable cardiomegaly without failure. Electronically Signed   By: San Morelle M.D.   On: 12/23/2020 12:33        Scheduled Meds: . Chlorhexidine Gluconate Cloth  6 each Topical Daily  . insulin aspart  0-5 Units Subcutaneous QHS  . insulin aspart  0-9 Units Subcutaneous TID WC  . mouth rinse  15 mL Mouth Rinse BID   Continuous Infusions: . calcium gluconate    . dextrose 5% lactated ringers Stopped (12/24/20 2100)  . insulin Stopped (12/24/20 2100)  . lactated ringers 100 mL/hr at 12/25/20 0600  . magnesium sulfate bolus IVPB    . potassium chloride    . remdesivir 100 mg in NS 100 mL Stopped (12/24/20 1116)     LOS: 2 days    Time spent: 35 minutes    Ellsie Violette D Manuella Ghazi, DO [facility]  If 7PM-7AM, please contact night-coverage [email or web address] 12/25/2020, 10:30 AM

## 2020-12-25 NOTE — Progress Notes (Signed)
Subjective:  2 liters of urine- crt down slightly -  BUN still high -  He is not very responsive-  Has mittens on   Objective Vital signs in last 24 hours: Vitals:   12/25/20 0400 12/25/20 0500 12/25/20 0600 12/25/20 0800  BP: (!) 145/76 126/71 (!) 142/58   Pulse: 81 94 66   Resp: 17 14 14    Temp: 98 F (36.7 C)   97.8 F (36.6 C)  TempSrc: Oral   Axillary  SpO2: 99% 97% 99%   Weight:       Weight change:   Intake/Output Summary (Last 24 hours) at 12/25/2020 7353 Last data filed at 12/25/2020 0600 Gross per 24 hour  Intake 1914.44 ml  Output 2050 ml  Net -135.56 ml    Assessment/ Plan: Pt is a 80 y.o. yo male with baseline crt around 2 who was admitted on 12/23/2020 with confusion in the setting of COVID and A on CRF Assessment/Plan: 1. Renal-  A on CRF as above. Overall renal function has improved,  albeit slowly,  he is non oliguric.  There have been no acute indications for renal replacement therapy.  I wish that he was looking a little better though as I feel the BUN over 100 is likely contributing to his decreased MS-  Will continue to observe for now and hope for improvement without [place] needing to add on dialysis to this mix 2. HTN/vol-  Does not seem wet- is on LR at 100 per hour-  OK to continue  3. Anemia- supportive care-  Will add ESA 4. Metabolic acidosis-  Cont LR 5. Hypokalemia-  Continue gentle repletion     Louis Meckel    Labs: Basic Metabolic Panel: Recent Labs  Lab 12/24/20 1307 12/24/20 1727 12/25/20 0545  NA 140 142  141 144  K 3.4* 3.3*  3.2* 3.3*  CL 109 110  109 112*  CO2 14* 17*  15* 16*  GLUCOSE 114* 140*  140* 85  BUN 112* 110*  112* 114*  CREATININE 6.10* 5.94*  5.94* 5.88*  CALCIUM 5.7* 5.8*  5.8* 5.9*  PHOS  --  9.0* 9.2*   Liver Function Tests: Recent Labs  Lab 12/23/20 1153 12/24/20 0440 12/24/20 1727 12/25/20 0545  AST 14* 16  --   --   ALT 27 23  --   --   ALKPHOS 101 88  --   --   BILITOT 0.5 0.4  --    --   PROT 5.7* 5.0*  --   --   ALBUMIN 2.5* 2.3* 2.2* 2.2*   Recent Labs  Lab 12/23/20 1153  LIPASE 21   No results for input(s): AMMONIA in the last 168 hours. CBC: Recent Labs  Lab 12/23/20 1153 12/24/20 0440 12/25/20 0545  WBC 10.5 10.4 9.4  NEUTROABS 8.9*  --   --   HGB 9.3* 8.5* 8.7*  HCT 28.8* 25.3* 26.1*  MCV 97.0 92.7 92.2  PLT 126* 147* 141*   Cardiac Enzymes: No results for input(s): CKTOTAL, CKMB, CKMBINDEX, TROPONINI in the last 168 hours. CBG: Recent Labs  Lab 12/24/20 1645 12/24/20 1821 12/24/20 1929 12/24/20 2025 12/25/20 0757  GLUCAP 143* 137* 156* 144* 84    Iron Studies:  Recent Labs    12/25/20 0545  FERRITIN 99   Studies/Results: CT Head Wo Contrast  Result Date: 12/23/2020 CLINICAL DATA:  Mental status changes EXAM: CT HEAD WITHOUT CONTRAST TECHNIQUE: Contiguous axial images were obtained from the base of the skull  through the vertex without intravenous contrast. COMPARISON:  12/04/2018 FINDINGS: Brain: No acute intracranial abnormality. Specifically, no hemorrhage, hydrocephalus, mass lesion, acute infarction, or significant intracranial injury. There is atrophy and chronic small vessel disease changes. Vascular: No hyperdense vessel or unexpected calcification. Skull: No acute calvarial abnormality. Sinuses/Orbits: No acute findings Other: None IMPRESSION: Atrophy, chronic microvascular disease. No acute intracranial abnormality. Electronically Signed   By: Rolm Baptise M.D.   On: 12/23/2020 12:54   US RENAL  Result Date: 12/24/2020 CLINICAL DATA:  80 year old male with acute renal insufficiency. EXAM: RENAL / URINARY TRACT ULTRASOUND COMPLETE COMPARISON:  Abdomen ultrasound 12/01/2017. CT Abdomen and Pelvis 03/20/2013. FINDINGS: Right Kidney: Renal measurements: 11.8 x 5.3 x 7.5 cm = volume: 247 mL. Cortical echogenicity within normal limits. No hydronephrosis. Extrarenal pelvis is stable (normal variant). No right renal mass. Left Kidney:  Renal measurements: 12.9 x 5.6 x 5.9 cm = volume: 221 mL. Cortical echogenicity within normal limits. Lower pole curvilinear hypoechogenicity on image 29 probably corresponds to chronic perinephric stranding seen in 2014. No left hydronephrosis today. Possible punctate chronic lower pole calculus on image 38, stable since 2014. Bladder: Decompressed and poorly visible, reportedly a Foley catheter is in place. Other: Echogenic liver on image 2. IMPRESSION: 1. No hydronephrosis or acute renal finding. Chronic left lower pole nephrolithiasis. 2. Evidence of fatty liver disease. Electronically Signed   By: Genevie Ann M.D.   On: 12/24/2020 11:18   DG Chest Portable 1 View  Result Date: 12/23/2020 CLINICAL DATA:  Weakness. COVID 2 weeks ago. EXAM: PORTABLE CHEST 1 VIEW COMPARISON:  Two-view chest x-ray 12/03/2018 FINDINGS: Heart is chronically enlarged. Chronic elevation of the right hemidiaphragm noted. No edema or effusion is present. No focal airspace disease is present. Axial skeleton is unremarkable. IMPRESSION: No acute cardiopulmonary disease or significant interval change. Stable cardiomegaly without failure. Electronically Signed   By: San Morelle M.D.   On: 12/23/2020 12:33   Medications: Infusions: . dextrose 5% lactated ringers Stopped (12/24/20 2100)  . insulin Stopped (12/24/20 2100)  . lactated ringers 100 mL/hr at 12/25/20 0600  . magnesium sulfate bolus IVPB 2 g (12/25/20 0818)  . remdesivir 100 mg in NS 100 mL Stopped (12/24/20 1116)    Scheduled Medications: . Chlorhexidine Gluconate Cloth  6 each Topical Daily  . insulin aspart  0-5 Units Subcutaneous QHS  . insulin aspart  0-9 Units Subcutaneous TID WC  . mouth rinse  15 mL Mouth Rinse BID    have reviewed scheduled and prn medications.  Physical Exam: General: eyes open-  No commands-  Mittens on  Heart: some tachy Lungs: CBS bilat Abdomen: soft, non  tender Extremities: no peripheral edema      12/25/2020,9:03  AM  LOS: 2 days

## 2020-12-26 DIAGNOSIS — N179 Acute kidney failure, unspecified: Secondary | ICD-10-CM | POA: Diagnosis not present

## 2020-12-26 LAB — RENAL FUNCTION PANEL
Albumin: 2.2 g/dL — ABNORMAL LOW (ref 3.5–5.0)
Anion gap: 16 — ABNORMAL HIGH (ref 5–15)
BUN: 110 mg/dL — ABNORMAL HIGH (ref 8–23)
CO2: 15 mmol/L — ABNORMAL LOW (ref 22–32)
Calcium: 6.1 mg/dL — CL (ref 8.9–10.3)
Chloride: 113 mmol/L — ABNORMAL HIGH (ref 98–111)
Creatinine, Ser: 5.89 mg/dL — ABNORMAL HIGH (ref 0.61–1.24)
GFR, Estimated: 9 mL/min — ABNORMAL LOW (ref >60)
Glucose, Bld: 115 mg/dL — ABNORMAL HIGH (ref 70–99)
Phosphorus: 9.5 mg/dL — ABNORMAL HIGH (ref 2.5–4.6)
Potassium: 3.8 mmol/L (ref 3.5–5.1)
Sodium: 144 mmol/L (ref 135–145)

## 2020-12-26 LAB — FERRITIN: Ferritin: 114 ng/mL (ref 24–336)

## 2020-12-26 LAB — CBC
HCT: 29.9 % — ABNORMAL LOW (ref 39.0–52.0)
Hemoglobin: 9.5 g/dL — ABNORMAL LOW (ref 13.0–17.0)
MCH: 30.4 pg (ref 26.0–34.0)
MCHC: 31.8 g/dL (ref 30.0–36.0)
MCV: 95.5 fL (ref 80.0–100.0)
Platelets: 150 10*3/uL (ref 150–400)
RBC: 3.13 MIL/uL — ABNORMAL LOW (ref 4.22–5.81)
RDW: 15.2 % (ref 11.5–15.5)
WBC: 9.4 10*3/uL (ref 4.0–10.5)
nRBC: 0 % (ref 0.0–0.2)

## 2020-12-26 LAB — C-REACTIVE PROTEIN: CRP: 6.6 mg/dL — ABNORMAL HIGH (ref <1.0)

## 2020-12-26 LAB — GLUCOSE, CAPILLARY
Glucose-Capillary: 106 mg/dL — ABNORMAL HIGH (ref 70–99)
Glucose-Capillary: 138 mg/dL — ABNORMAL HIGH (ref 70–99)
Glucose-Capillary: 269 mg/dL — ABNORMAL HIGH (ref 70–99)
Glucose-Capillary: 238 mg/dL — ABNORMAL HIGH (ref 70–99)

## 2020-12-26 LAB — MAGNESIUM: Magnesium: 2 mg/dL (ref 1.7–2.4)

## 2020-12-26 LAB — PARATHYROID HORMONE, INTACT (NO CA): PTH: 266 pg/mL — ABNORMAL HIGH (ref 15–65)

## 2020-12-26 MED ORDER — DARBEPOETIN ALFA 100 MCG/0.5ML IJ SOSY
100.0000 ug | PREFILLED_SYRINGE | INTRAMUSCULAR | Status: DC
  Administered 2020-12-26 – 2021-01-23 (×5): 100 ug via SUBCUTANEOUS
  Filled 2020-12-26 (×5): qty 0.5

## 2020-12-26 NOTE — Progress Notes (Addendum)
PROGRESS NOTE    [name]  XYV:[id number] DOB: May 10, 1941 DOA: 12/23/2020 PCP: [name], MD   Brief Narrative:   Mark Dicksonis a 80 y.o.malewith medical history significant fortype 2 diabetes, CKD stage IIIb, hypertension, dyslipidemia, diastolic CHF, and CAD described to the ED via EMS with increasing weakness and confusion along with poor appetite for the last 2 to 3 days.Patient was admitted with AKI and severe acidosis along with hyperglycemia.  He continues to have persistent AKI with uremic encephalopathy.  He is also noted to be COVID-positive and has been started on remdesivir.  Assessment & Plan:   Active Problems:   AKI (acute kidney injury) ([facility])   Generalized weakness with acute metabolic encephalopathy secondary to AKI with severe metabolic acidosis and uncompensated respiratory alkalosis -Suspect ongoing uremic encephalopathy -Severe acidosis related to AKIimproved -Careful monitoring in stepdown unit; with poor mentation still  Non-oliguricAKIon CKD3b-suspect prerenal with dehydration -Slowly improving -No further IV fluid per nephrology -Renal ultrasoundwithout hydronephrosis -Monitor I's and O's, angiogram -Avoid nephrotoxic agents -Appreciate ongoing nephrology evaluation; hopefully can avoid HD  Hypocalcemia- resolved -Approximately 8 with correction, hold further repletion and monitor  Hyperglycemiain setting of DM2with mild DKA-resolved -Compliant with home medications according to wife at bedside -Likely stress-induced -Hemoglobin A1c7.0% -Transitioned off insulin drip on 5/22, now on SSI  COVID-19 infection -Chest x-ray with stable cardiomegaly and no other signs of infiltrate -Started on remdesivir, currently day 3 -No need for steroids given no hypoxemia,and would be high risk given hyperglycemia -Continue to trend CRP and ferritin -Isolation precautions  Thrombocytopenia-stable -Continue to  monitor closely -Avoid heparin agents  Hypertension/CAD -Hold home medications for now until mentation improves -Blood pressure is borderline elevated, this may be due to ongoing IV fluid use which will be discontinued by nephrology today  Hard of hearing -Hearing aids   DVT prophylaxis:SCDs Code Status:Full Family Communication:Called wife on phone 5/23, did not answer on 5/24 Disposition Plan: Status is: Inpatient  Remains inpatient appropriate because:Altered mental status, IV treatments appropriate due to intensity of illness or inability to take PO and Inpatient level of care appropriate due to severity of illness   Dispo: The patient is from:Home Anticipated d/c is KM:MNOT Patient currently is not medically stable to d/c. Difficult to place patient No   Consultants:  Nephrology  Procedures:  See below  Antimicrobials:  Anti-infectives (From admission, onward)   Start     Dose/Rate Route Frequency Ordered Stop   12/24/20 1000  remdesivir 100 mg in sodium chloride 0.9 % 100 mL IVPB        100 mg 200 mL/hr over 30 Minutes Intravenous Daily 12/23/20 1637 12/28/20 0959   12/23/20 1645  remdesivir 100 mg in sodium chloride 0.9 % 100 mL IVPB        100 mg 200 mL/hr over 30 Minutes Intravenous Every 1 hr x 2 12/23/20 1636 12/23/20 1908       Subjective: Patient seen and evaluated today with no new acute complaints or concerns. No acute concerns or events noted overnight.  He continues to remain mostly somnolent, but arousable and appears to give coherent responses to questioning.  He appears to be complaining of some low back pain and would like to sit in the chair.  Objective: Vitals:   12/26/20 0500 12/26/20 0600 12/26/20 0800 12/26/20 0900  BP: (!) 144/62 (!) 146/74 (!) 150/88 (!) 165/83  Pulse: 79 98 (!) 103 92  Resp:  18  (!) 21  Temp:      TempSrc:      SpO2: 99% 99% 99% 100%  Weight:         Intake/Output Summary (Last 24 hours) at 12/26/2020 1138 Last data filed at 12/26/2020 0600 Gross per 24 hour  Intake 1550 ml  Output 150 ml  Net 1400 ml   Filed Weights   12/23/20 2300 12/26/20 0444  Weight: 87.1 kg 92.1 kg    Examination:  General exam: Appears somnolent but arousable, hard of hearing Respiratory system: Clear to auscultation. Respiratory effort normal. Cardiovascular system: S1 & S2 heard, RRR.  Gastrointestinal system: Abdomen is soft Central nervous system: Answers questions coherently Extremities: No edema Skin: No significant lesions noted Psychiatry: Flat affect.    Data Reviewed: I have personally reviewed following labs and imaging studies  CBC: Recent Labs  Lab 12/23/20 1153 12/24/20 0440 12/25/20 0545 12/26/20 0508  WBC 10.5 10.4 9.4 9.4  NEUTROABS 8.9*  --   --   --   HGB 9.3* 8.5* 8.7* 9.5*  HCT 28.8* 25.3* 26.1* 29.9*  MCV 97.0 92.7 92.2 95.5  PLT 126* 147* 141* 706   Basic Metabolic Panel: Recent Labs  Lab 12/24/20 0440 12/24/20 0853 12/24/20 1307 12/24/20 1727 12/25/20 0545 12/26/20 0508  NA 143 141 140 142  141 144 144  K 3.4* 3.3* 3.4* 3.3*  3.2* 3.3* 3.8  CL 113* 111 109 110  109 112* 113*  CO2 13* 14* 14* 17*  15* 16* 15*  GLUCOSE 135* 148* 114* 140*  140* 85 115*  BUN 113* 99* 112* 110*  112* 114* 110*  CREATININE 6.29* 6.34* 6.10* 5.94*  5.94* 5.88* 5.89*  CALCIUM 5.5* 5.2* 5.7* 5.8*  5.8* 5.9* 6.1*  MG 1.2*  --   --   --  1.5* 2.0  PHOS  --   --   --  9.0* 9.2* 9.5*   GFR: Estimated Creatinine Clearance: 11.3 mL/min (A) (by C-G formula based on SCr of 5.89 mg/dL (H)). Liver Function Tests: Recent Labs  Lab 12/23/20 1153 12/24/20 0440 12/24/20 1727 12/25/20 0545 12/26/20 0508  AST 14* 16  --   --   --   ALT 27 23  --   --   --   ALKPHOS 101 88  --   --   --   BILITOT 0.5 0.4  --   --   --   PROT 5.7* 5.0*  --   --   --   ALBUMIN 2.5* 2.3* 2.2* 2.2* 2.2*   Recent Labs  Lab 12/23/20 1153   LIPASE 21   No results for input(s): AMMONIA in the last 168 hours. Coagulation Profile: No results for input(s): INR, PROTIME in the last 168 hours. Cardiac Enzymes: No results for input(s): CKTOTAL, CKMB, CKMBINDEX, TROPONINI in the last 168 hours. BNP (last 3 results) No results for input(s): PROBNP in the last 8760 hours. HbA1C: Recent Labs    12/23/20 1203  HGBA1C 7.0*   CBG: Recent Labs  Lab 12/25/20 0757 12/25/20 1136 12/25/20 1635 12/25/20 2123 12/26/20 0810  GLUCAP 84 81 146* 109* 106*   Lipid Profile: No results for input(s): CHOL, HDL, LDLCALC, TRIG, CHOLHDL, LDLDIRECT in the last 72 hours. Thyroid Function Tests: Recent Labs    12/23/20 1558  TSH 2.262   Anemia Panel: Recent Labs    12/25/20 0545 12/26/20 0508  FERRITIN 99 114   Sepsis Labs: Recent Labs  Lab 12/23/20 1510 12/23/20 1728  LATICACIDVEN 1.1 0.8  Recent Results (from the past 240 hour(s))  Resp Panel by RT-PCR (Flu A&B, Covid) Nasopharyngeal Swab     Status: Abnormal   Collection Time: 12/23/20 12:06 PM   Specimen: Nasopharyngeal Swab; Nasopharyngeal(NP) swabs in vial transport medium  Result Value Ref Range Status   SARS Coronavirus 2 by RT PCR POSITIVE (A) NEGATIVE Final    Comment: CRITICAL RESULT CALLED TO, READ BACK BY AND VERIFIED WITH: OAKLEY,B 1323 12/23/2020 COLEMAN,R (NOTE) SARS-CoV-2 target nucleic acids are DETECTED.  The SARS-CoV-2 RNA is generally detectable in upper respiratory specimens during the acute phase of infection. Positive results are indicative of the presence of the identified virus, but do not rule out bacterial infection or co-infection with other pathogens not detected by the test. Clinical correlation with patient history and other diagnostic information is necessary to determine patient infection status. The expected result is Negative.  Fact Sheet for Patients: EntrepreneurPulse.com.au  Fact Sheet for Healthcare  Providers: IncredibleEmployment.be  This test is not yet approved or cleared by the [place] FDA and  has been authorized for detection and/or diagnosis of SARS-CoV-2 by FDA under an Emergency Use Authorization (EUA).  This EUA will remain in effect (meaning this t est can be used) for the duration of  the COVID-19 declaration under Section 564(b)(1) of the Act, 21 U.S.C. section 360bbb-3(b)(1), unless the authorization is terminated or revoked sooner.     Influenza A by PCR NEGATIVE NEGATIVE Final   Influenza B by PCR NEGATIVE NEGATIVE Final    Comment: (NOTE) The Xpert Xpress SARS-CoV-2/FLU/RSV plus assay is intended as an aid in the diagnosis of influenza from Nasopharyngeal swab specimens and should not be used as a sole basis for treatment. Nasal washings and aspirates are unacceptable for Xpert Xpress SARS-CoV-2/FLU/RSV testing.  Fact Sheet for Patients: EntrepreneurPulse.com.au  Fact Sheet for Healthcare Providers: IncredibleEmployment.be  This test is not yet approved or cleared by the [place] FDA and has been authorized for detection and/or diagnosis of SARS-CoV-2 by FDA under an Emergency Use Authorization (EUA). This EUA will remain in effect (meaning this test can be used) for the duration of the COVID-19 declaration under Section 564(b)(1) of the Act, 21 U.S.C. section 360bbb-3(b)(1), unless the authorization is terminated or revoked.  Performed at [facility], [address], [place], [place] [address]   Urine culture     Status: Abnormal   Collection Time: 12/23/20  6:28 PM   Specimen: Urine, Clean Catch  Result Value Ref Range Status   Specimen Description   Final    URINE, CLEAN CATCH Performed at [facility], [address], [place], [place] [address]    Special Requests   Final    NONE Performed at [facility], [address], [place], [place] [address]    Culture (A)  Final    <10,000  COLONIES/mL INSIGNIFICANT GROWTH Performed at [facility], [place] [address] [place], [place] [address]    Report Status 12/25/2020 FINAL  Final  MRSA PCR Screening     Status: None   Collection Time: 12/23/20  9:59 PM   Specimen: Nasal Mucosa; Nasopharyngeal  Result Value Ref Range Status   MRSA by PCR NEGATIVE NEGATIVE Final    Comment:        The GeneXpert MRSA Assay (FDA approved for NASAL specimens only), is one component of a comprehensive MRSA colonization surveillance program. It is not intended to diagnose MRSA infection nor to guide or monitor treatment for MRSA infections. Performed at [facility], [address] [place], [place] [address]          Radiology Studies: No results found.      Scheduled Meds: . Chlorhexidine Gluconate Cloth  6 each Topical Daily  . darbepoetin (ARANESP) injection - NON-DIALYSIS  100 mcg Subcutaneous Q Tue-1800  . insulin aspart  0-5 Units Subcutaneous QHS  . insulin aspart  0-9 Units Subcutaneous TID WC  . mouth rinse  15 mL Mouth Rinse BID   Continuous Infusions: . insulin Stopped (12/24/20 2100)  . remdesivir 100 mg in NS 100 mL 100 mg (12/26/20 1023)     LOS: 3 days    Time spent: 35 minutes    Tyrae Alcoser D Manuella Ghazi, DO [facility]  If 7PM-7AM, please contact night-coverage [email or web address] 12/26/2020, 11:38 AM

## 2020-12-26 NOTE — Progress Notes (Signed)
Subjective:  Either UOP dropped or just not recorded well.  BUN and crt essentially stable -  No other major changes  Objective Vital signs in last 24 hours: Vitals:   12/26/20 0400 12/26/20 0444 12/26/20 0500 12/26/20 0600  BP: (!) 160/81  (!) 144/62 (!) 146/74  Pulse: 100  79 98  Resp:    18  Temp:      TempSrc:      SpO2: 99%  99% 99%  Weight:  92.1 kg     Weight change:   Intake/Output Summary (Last 24 hours) at 12/26/2020 0809 Last data filed at 12/26/2020 0600 Gross per 24 hour  Intake 1550 ml  Output 150 ml  Net 1400 ml    Assessment/ Plan: Pt is a 80 y.o. yo male with baseline crt int the 2's who was admitted on 12/23/2020 with confusion in the setting of symptomatic COVID and A on CRF Assessment/Plan: 1. Renal-  A on CRF as above. Overall renal function has improved,  albeit slowly,  he is non oliguric.  There have been no acute indications for renal replacement therapy.  I wish that he was looking a little better though as I feel the BUN over 100 is likely contributing to his decreased MS-  Will continue to observe for now and hope for improvement without [place] needing to add on dialysis to this mix 2. HTN/vol-   is on LR at 100 per hour-  O2 req and BP if anything is high so will stop-  Thin tank seems full 3. Anemia- supportive care-  Will add ESA 4. Metabolic acidosis-   LR but now stopping-  Once able to take pos will add sodium bicarb pills  5. Hypokalemia-  Continue gentle repletion PRN  6. Hypocalcemia-  Corrected is around 8-  No repletion needed     Louis Meckel    Labs: Basic Metabolic Panel: Recent Labs  Lab 12/24/20 1727 12/25/20 0545 12/26/20 0508  NA 142  141 144 144  K 3.3*  3.2* 3.3* 3.8  CL 110  109 112* 113*  CO2 17*  15* 16* 15*  GLUCOSE 140*  140* 85 115*  BUN 110*  112* 114* 110*  CREATININE 5.94*  5.94* 5.88* 5.89*  CALCIUM 5.8*  5.8* 5.9* 6.1*  PHOS 9.0* 9.2* 9.5*   Liver Function Tests: Recent Labs  Lab  12/23/20 1153 12/24/20 0440 12/24/20 1727 12/25/20 0545 12/26/20 0508  AST 14* 16  --   --   --   ALT 27 23  --   --   --   ALKPHOS 101 88  --   --   --   BILITOT 0.5 0.4  --   --   --   PROT 5.7* 5.0*  --   --   --   ALBUMIN 2.5* 2.3* 2.2* 2.2* 2.2*   Recent Labs  Lab 12/23/20 1153  LIPASE 21   No results for input(s): AMMONIA in the last 168 hours. CBC: Recent Labs  Lab 12/23/20 1153 12/24/20 0440 12/25/20 0545 12/26/20 0508  WBC 10.5 10.4 9.4 9.4  NEUTROABS 8.9*  --   --   --   HGB 9.3* 8.5* 8.7* 9.5*  HCT 28.8* 25.3* 26.1* 29.9*  MCV 97.0 92.7 92.2 95.5  PLT 126* 147* 141* 150   Cardiac Enzymes: No results for input(s): CKTOTAL, CKMB, CKMBINDEX, TROPONINI in the last 168 hours. CBG: Recent Labs  Lab 12/24/20 2025 12/25/20 0757 12/25/20 1136 12/25/20 1635 12/25/20 2123  GLUCAP 144* 84 81 146* 109*    Iron Studies:  Recent Labs    12/26/20 0508  FERRITIN 114   Studies/Results: US RENAL  Result Date: 12/24/2020 CLINICAL DATA:  80 year old male with acute renal insufficiency. EXAM: RENAL / URINARY TRACT ULTRASOUND COMPLETE COMPARISON:  Abdomen ultrasound 12/01/2017. CT Abdomen and Pelvis 03/20/2013. FINDINGS: Right Kidney: Renal measurements: 11.8 x 5.3 x 7.5 cm = volume: 247 mL. Cortical echogenicity within normal limits. No hydronephrosis. Extrarenal pelvis is stable (normal variant). No right renal mass. Left Kidney: Renal measurements: 12.9 x 5.6 x 5.9 cm = volume: 221 mL. Cortical echogenicity within normal limits. Lower pole curvilinear hypoechogenicity on image 29 probably corresponds to chronic perinephric stranding seen in 2014. No left hydronephrosis today. Possible punctate chronic lower pole calculus on image 38, stable since 2014. Bladder: Decompressed and poorly visible, reportedly a Foley catheter is in place. Other: Echogenic liver on image 2. IMPRESSION: 1. No hydronephrosis or acute renal finding. Chronic left lower pole nephrolithiasis. 2.  Evidence of fatty liver disease. Electronically Signed   By: Genevie Ann M.D.   On: 12/24/2020 11:18   Medications: Infusions: . dextrose 5% lactated ringers Stopped (12/24/20 2100)  . insulin Stopped (12/24/20 2100)  . lactated ringers 100 mL/hr at 12/26/20 0600  . remdesivir 100 mg in NS 100 mL Stopped (12/25/20 1900)    Scheduled Medications: . Chlorhexidine Gluconate Cloth  6 each Topical Daily  . insulin aspart  0-5 Units Subcutaneous QHS  . insulin aspart  0-9 Units Subcutaneous TID WC  . mouth rinse  15 mL Mouth Rinse BID    have reviewed scheduled and prn medications.  Physical Exam: General: eyes open-  No commands-  [place] on  Heart: some tachy Lungs: CBS bilat Abdomen: soft, non  tender Extremities: no peripheral edema      12/26/2020,8:09 AM  LOS: 3 days

## 2020-12-27 DIAGNOSIS — U071 COVID-19: Secondary | ICD-10-CM

## 2020-12-27 DIAGNOSIS — N179 Acute kidney failure, unspecified: Secondary | ICD-10-CM | POA: Diagnosis not present

## 2020-12-27 DIAGNOSIS — E872 Acidosis: Secondary | ICD-10-CM

## 2020-12-27 LAB — C-REACTIVE PROTEIN: CRP: 8.7 mg/dL — ABNORMAL HIGH (ref <1.0)

## 2020-12-27 LAB — RENAL FUNCTION PANEL
Albumin: 2.4 g/dL — ABNORMAL LOW (ref 3.5–5.0)
Anion gap: 15 (ref 5–15)
BUN: 120 mg/dL — ABNORMAL HIGH (ref 8–23)
CO2: 17 mmol/L — ABNORMAL LOW (ref 22–32)
Calcium: 6.5 mg/dL — ABNORMAL LOW (ref 8.9–10.3)
Chloride: 112 mmol/L — ABNORMAL HIGH (ref 98–111)
Creatinine, Ser: 5.77 mg/dL — ABNORMAL HIGH (ref 0.61–1.24)
GFR, Estimated: 9 mL/min — ABNORMAL LOW (ref >60)
Glucose, Bld: 141 mg/dL — ABNORMAL HIGH (ref 70–99)
Phosphorus: 10.4 mg/dL — ABNORMAL HIGH (ref 2.5–4.6)
Potassium: 3.5 mmol/L (ref 3.5–5.1)
Sodium: 144 mmol/L (ref 135–145)

## 2020-12-27 LAB — GLUCOSE, CAPILLARY
Glucose-Capillary: 108 mg/dL — ABNORMAL HIGH (ref 70–99)
Glucose-Capillary: 131 mg/dL — ABNORMAL HIGH (ref 70–99)
Glucose-Capillary: 175 mg/dL — ABNORMAL HIGH (ref 70–99)
Glucose-Capillary: 270 mg/dL — ABNORMAL HIGH (ref 70–99)

## 2020-12-27 LAB — CBC
HCT: 30.1 % — ABNORMAL LOW (ref 39.0–52.0)
Hemoglobin: 9.6 g/dL — ABNORMAL LOW (ref 13.0–17.0)
MCH: 30.6 pg (ref 26.0–34.0)
MCHC: 31.9 g/dL (ref 30.0–36.0)
MCV: 95.9 fL (ref 80.0–100.0)
Platelets: 151 10*3/uL (ref 150–400)
RBC: 3.14 MIL/uL — ABNORMAL LOW (ref 4.22–5.81)
RDW: 15.1 % (ref 11.5–15.5)
WBC: 10.4 10*3/uL (ref 4.0–10.5)
nRBC: 0 % (ref 0.0–0.2)

## 2020-12-27 LAB — MAGNESIUM: Magnesium: 2.1 mg/dL (ref 1.7–2.4)

## 2020-12-27 LAB — FERRITIN: Ferritin: 120 ng/mL (ref 24–336)

## 2020-12-27 MED ORDER — SODIUM BICARBONATE 650 MG PO TABS
650.0000 mg | ORAL_TABLET | Freq: Two times a day (BID) | ORAL | Status: DC
  Administered 2020-12-27 (×2): 650 mg via ORAL
  Filled 2020-12-27 (×2): qty 1

## 2020-12-27 MED ORDER — CALCIUM CARBONATE ANTACID 500 MG PO CHEW
800.0000 mg | CHEWABLE_TABLET | Freq: Three times a day (TID) | ORAL | Status: DC
  Administered 2020-12-27 – 2021-01-24 (×80): 800 mg via ORAL
  Filled 2020-12-27 (×82): qty 4

## 2020-12-27 NOTE — Progress Notes (Signed)
PROGRESS NOTE    [name]  ZWC:[id number] DOB: 1941-02-07 DOA: 12/23/2020 PCP: [name], MD   Brief Narrative:   Mark Dicksonis a 80 y.o.malewith medical history significant fortype 2 diabetes, CKD stage IIIb, hypertension, dyslipidemia, diastolic CHF, and CAD described to the ED via EMS with increasing weakness and confusion along with poor appetite for the last 2 to 3 days.Patient was admitted with AKI and severe acidosis along with hyperglycemia.  He continues to have persistent AKI with uremic encephalopathy.  He is also noted to be COVID-positive and has been started on remdesivir.  Assessment & Plan:   Active Problems:   AKI (acute kidney injury) ([place])   Generalized weakness with acute metabolic encephalopathy secondary to AKI with severe metabolic acidosis and uncompensated respiratory alkalosis -Suspect ongoing uremic encephalopathy -Severe acidosis related to AKI; bicarb 17 today; patient started on sodium bicarbonate today. -Careful monitoring in stepdown unit; with poor mentation still  Non-oliguricAKIon CKD3b-suspect prerenal with dehydration -Continue slowly improving -Holding on further IV fluids currently; good urine output demonstrated. -Renal ultrasoundwithout hydronephrosis -Monitor I's and O's, angiogram -Continue avoiding nephrotoxic agents, hypotension and the use of contrast. -Appreciate ongoing nephrology evaluation; at this moment-hoping not to need hemodialysis treatment. -Creatinine trending down sounds; BUN is still elevated.  Hypocalcemia -resolved -Continue to follow electrolytes trend -Continue telemetry monitoring.  Hyperglycemiain setting of DM2with mild DKA -resolved -Compliant with home medications according to wife at bedside -Likely stress-induced -Hemoglobin A1c7.0% demonstrating good control. -Continue sliding scale insulin and follow CBGs fluctuation.  COVID-19 infection -Chest x-ray with  stable cardiomegaly and no other signs of infiltrate -Patient received 3 days of remdesivir for asymptomatic COVID-19 infection. -No need for steroids given no hypoxemia -Continue to trend CRP and ferritin -Continue isolation precautions  Thrombocytopenia -stable -Continue to monitor platelet count trend. -Continue avoiding heparin agents  Hypertension/CAD -Continue holding home medications for now until mentation further improves and his renal function further stabilize. -Continue to follow vital signs.  Hard of hearing -Patient uses hearing aids   DVT prophylaxis:SCDs Code Status:Full Family Communication:Unable to reach wife over the phone. Disposition Plan: Status is: Inpatient  Remains inpatient appropriate because:Altered mental status, IV treatments appropriate due to intensity of illness or inability to take PO and Inpatient level of care appropriate due to severity of illness   Dispo: The patient is from:Home Anticipated d/c is MP:NTIR Patient currently is not medically stable to d/c. Difficult to place patient No   Consultants:  Nephrology  Procedures:  See below  Antimicrobials:  Anti-infectives (From admission, onward)   Start     Dose/Rate Route Frequency Ordered Stop   12/24/20 1000  remdesivir 100 mg in sodium chloride 0.9 % 100 mL IVPB        100 mg 200 mL/hr over 30 Minutes Intravenous Daily 12/23/20 1637 12/27/20 1200   12/23/20 1645  remdesivir 100 mg in sodium chloride 0.9 % 100 mL IVPB        100 mg 200 mL/hr over 30 Minutes Intravenous Every 1 hr x 2 12/23/20 1636 12/23/20 1908      Subjective: No overnight events; oriented x1 and intermittently answering questions appropriately.  No chest pain, no nausea, no vomiting.  Patient is afebrile  Objective: Vitals:   12/27/20 0400 12/27/20 0500 12/27/20 0800 12/27/20 0831  BP: (!) 152/89  (!) 150/71   Pulse: (!) 109  (!) 108 (!) 108   Resp: 15  16 15   Temp:    98.1  F (36.7 C)  TempSrc:    Axillary  SpO2: 100%  100% 100%  Weight:  93 kg      Intake/Output Summary (Last 24 hours) at 12/27/2020 1845 Last data filed at 12/27/2020 8786 Gross per 24 hour  Intake 820 ml  Output 800 ml  Net 20 ml   Filed Weights   12/23/20 2300 12/26/20 0444 12/27/20 0500  Weight: 87.1 kg 92.1 kg 93 kg    Examination: General exam: Alert, awake, oriented x 1; hard of hearing and answering questions appropriately intermittently.  No chest pain, no nausea or vomiting. Respiratory system: Good air movement bilaterally; positive rhonchi appreciated.  No wheezing or crackles on exam. Cardiovascular system:RRR.  No rubs, no gallops, no JVD. Gastrointestinal system: Abdomen is nondistended, soft with positive bowel sounds. Central nervous system: Alert and oriented. No focal neurological deficits. Extremities: No cyanosis or clubbing. Skin: No petechiae. Psychiatry: Mood & affect appropriate.    Data Reviewed: I have personally reviewed following labs and imaging studies  CBC: Recent Labs  Lab 12/23/20 1153 12/24/20 0440 12/25/20 0545 12/26/20 0508 12/27/20 0910  WBC 10.5 10.4 9.4 9.4 10.4  NEUTROABS 8.9*  --   --   --   --   HGB 9.3* 8.5* 8.7* 9.5* 9.6*  HCT 28.8* 25.3* 26.1* 29.9* 30.1*  MCV 97.0 92.7 92.2 95.5 95.9  PLT 126* 147* 141* 150 767   Basic Metabolic Panel: Recent Labs  Lab 12/24/20 0440 12/24/20 0853 12/24/20 1307 12/24/20 1727 12/25/20 0545 12/26/20 0508 12/27/20 0515  NA 143   < > 140 142  141 144 144 144  K 3.4*   < > 3.4* 3.3*  3.2* 3.3* 3.8 3.5  CL 113*   < > 109 110  109 112* 113* 112*  CO2 13*   < > 14* 17*  15* 16* 15* 17*  GLUCOSE 135*   < > 114* 140*  140* 85 115* 141*  BUN 113*   < > 112* 110*  112* 114* 110* 120*  CREATININE 6.29*   < > 6.10* 5.94*  5.94* 5.88* 5.89* 5.77*  CALCIUM 5.5*   < > 5.7* 5.8*  5.8* 5.9* 6.1* 6.5*  MG 1.2*  --   --   --  1.5* 2.0 2.1  PHOS  --   --    --  9.0* 9.2* 9.5* 10.4*   < > = values in this interval not displayed.   GFR: Estimated Creatinine Clearance: 11.6 mL/min (A) (by C-G formula based on SCr of 5.77 mg/dL (H)).   Liver Function Tests: Recent Labs  Lab 12/23/20 1153 12/24/20 0440 12/24/20 1727 12/25/20 0545 12/26/20 0508 12/27/20 0515  AST 14* 16  --   --   --   --   ALT 27 23  --   --   --   --   ALKPHOS 101 88  --   --   --   --   BILITOT 0.5 0.4  --   --   --   --   PROT 5.7* 5.0*  --   --   --   --   ALBUMIN 2.5* 2.3* 2.2* 2.2* 2.2* 2.4*   Recent Labs  Lab 12/23/20 1153  LIPASE 21   CBG: Recent Labs  Lab 12/26/20 1639 12/26/20 2124 12/27/20 0829 12/27/20 1110 12/27/20 1619  GLUCAP 269* 238* 108* 131* 175*   Anemia Panel: Recent Labs    12/26/20 0508 12/27/20 0515  FERRITIN 114 120   Sepsis  Labs: Recent Labs  Lab 12/23/20 1510 12/23/20 1728  LATICACIDVEN 1.1 0.8    Recent Results (from the past 240 hour(s))  Resp Panel by RT-PCR (Flu A&B, Covid) Nasopharyngeal Swab     Status: Abnormal   Collection Time: 12/23/20 12:06 PM   Specimen: Nasopharyngeal Swab; Nasopharyngeal(NP) swabs in vial transport medium  Result Value Ref Range Status   SARS Coronavirus 2 by RT PCR POSITIVE (A) NEGATIVE Final    Comment: CRITICAL RESULT CALLED TO, READ BACK BY AND VERIFIED WITH: OAKLEY,B 1323 12/23/2020 COLEMAN,R (NOTE) SARS-CoV-2 target nucleic acids are DETECTED.  The SARS-CoV-2 RNA is generally detectable in upper respiratory specimens during the acute phase of infection. Positive results are indicative of the presence of the identified virus, but do not rule out bacterial infection or co-infection with other pathogens not detected by the test. Clinical correlation with patient history and other diagnostic information is necessary to determine patient infection status. The expected result is Negative.  Fact Sheet for Patients: EntrepreneurPulse.com.au  Fact Sheet for  Healthcare Providers: IncredibleEmployment.be  This test is not yet approved or cleared by the [place] FDA and  has been authorized for detection and/or diagnosis of SARS-CoV-2 by FDA under an Emergency Use Authorization (EUA).  This EUA will remain in effect (meaning this t est can be used) for the duration of  the COVID-19 declaration under Section 564(b)(1) of the Act, 21 U.S.C. section 360bbb-3(b)(1), unless the authorization is terminated or revoked sooner.     Influenza A by PCR NEGATIVE NEGATIVE Final   Influenza B by PCR NEGATIVE NEGATIVE Final    Comment: (NOTE) The Xpert Xpress SARS-CoV-2/FLU/RSV plus assay is intended as an aid in the diagnosis of influenza from Nasopharyngeal swab specimens and should not be used as a sole basis for treatment. Nasal washings and aspirates are unacceptable for Xpert Xpress SARS-CoV-2/FLU/RSV testing.  Fact Sheet for Patients: EntrepreneurPulse.com.au  Fact Sheet for Healthcare Providers: IncredibleEmployment.be  This test is not yet approved or cleared by the [place] FDA and has been authorized for detection and/or diagnosis of SARS-CoV-2 by FDA under an Emergency Use Authorization (EUA). This EUA will remain in effect (meaning this test can be used) for the duration of the COVID-19 declaration under Section 564(b)(1) of the Act, 21 U.S.C. section 360bbb-3(b)(1), unless the authorization is terminated or revoked.  Performed at [facility], [address] [place], [place] [address]   Urine culture     Status: Abnormal   Collection Time: 12/23/20  6:28 PM   Specimen: Urine, Clean Catch  Result Value Ref Range Status   Specimen Description   Final    URINE, CLEAN CATCH Performed at [facility], [address] [place], [place] [address]    Special Requests   Final    NONE Performed at [facility], [address], [place], [place] [address]    Culture (A)  Final     <10,000 COLONIES/mL INSIGNIFICANT GROWTH Performed at [facility], [place] [address], [place], [place] [address]    Report Status 12/25/2020 FINAL  Final  MRSA PCR Screening     Status: None   Collection Time: 12/23/20  9:59 PM   Specimen: Nasal Mucosa; Nasopharyngeal  Result Value Ref Range Status   MRSA by PCR NEGATIVE NEGATIVE Final    Comment:        The GeneXpert MRSA Assay (FDA approved for NASAL specimens only), is one component of a comprehensive MRSA colonization surveillance program. It is not intended  to diagnose MRSA infection nor to guide or monitor treatment for MRSA infections. Performed at [facility], [address] [place], [place] [address]      Radiology Studies: No results found.  Scheduled Meds: . calcium carbonate  800 mg of elemental calcium Oral TID WC  . Chlorhexidine Gluconate Cloth  6 each Topical Daily  . darbepoetin (ARANESP) injection - NON-DIALYSIS  100 mcg Subcutaneous Q Tue-1800  . insulin aspart  0-5 Units Subcutaneous QHS  . insulin aspart  0-9 Units Subcutaneous TID WC  . mouth rinse  15 mL Mouth Rinse BID  . sodium bicarbonate  650 mg Oral BID   Continuous Infusions: . insulin Stopped (12/24/20 2100)     LOS: 4 days    Time spent: 35 minutes    Barton Dubois, MD [facility]  If 7PM-7AM, please contact night-coverage [email or web address] 12/27/2020, 6:45 PM

## 2020-12-27 NOTE — Progress Notes (Signed)
Patient ID: Mark Hose., male   DOB: 1940-11-01, 80 y.o.   MRN: [id number] S: No events overnight O:BP (!) 150/71   Pulse (!) 108   Temp 98.1 F (36.7 C) (Axillary)   Resp 15   Wt 93 kg   SpO2 100%   BMI 30.72 kg/m   Intake/Output Summary (Last 24 hours) at 12/27/2020 1208 Last data filed at 12/27/2020 6629 Gross per 24 hour  Intake 820 ml  Output 1400 ml  Net -580 ml   Intake/Output: I/O last 3 completed shifts: In: 1920 [P.O.:720; I.V.:1100; IV Piggyback:100] Out: 1550 [Urine:1550]  Intake/Output this shift:  No intake/output data recorded. Weight change: 0.9 kg Gen: resting comfortably in bed Physical exam: unable to complete due to COVID + status.  In order to preserve PPE equipment and to minimize exposure to providers.  Notes from other caregivers reviewed   Recent Labs  Lab 12/23/20 1153 12/23/20 2056 12/24/20 0440 12/24/20 0853 12/24/20 1307 12/24/20 1727 12/25/20 0545 12/26/20 0508 12/27/20 0515  NA 137   < > 143 141 140 142  141 144 144 144  K 4.4   < > 3.4* 3.3* 3.4* 3.3*  3.2* 3.3* 3.8 3.5  CL 114*   < > 113* 111 109 110  109 112* 113* 112*  CO2 7*   < > 13* 14* 14* 17*  15* 16* 15* 17*  GLUCOSE 321*   < > 135* 148* 114* 140*  140* 85 115* 141*  BUN 124*   < > 113* 99* 112* 110*  112* 114* 110* 120*  CREATININE 6.87*   < > 6.29* 6.34* 6.10* 5.94*  5.94* 5.88* 5.89* 5.77*  ALBUMIN 2.5*  --  2.3*  --   --  2.2* 2.2* 2.2* 2.4*  CALCIUM 5.8*   < > 5.5* 5.2* 5.7* 5.8*  5.8* 5.9* 6.1* 6.5*  PHOS  --   --   --   --   --  9.0* 9.2* 9.5* 10.4*  AST 14*  --  16  --   --   --   --   --   --   ALT 27  --  23  --   --   --   --   --   --    < > = values in this interval not displayed.   Liver Function Tests: Recent Labs  Lab 12/23/20 1153 12/24/20 0440 12/24/20 1727 12/25/20 0545 12/26/20 0508 12/27/20 0515  AST 14* 16  --   --   --   --   ALT 27 23  --   --   --   --   ALKPHOS 101 88  --   --   --   --   BILITOT 0.5 0.4  --   --   --    --   PROT 5.7* 5.0*  --   --   --   --   ALBUMIN 2.5* 2.3*   < > 2.2* 2.2* 2.4*   < > = values in this interval not displayed.   Recent Labs  Lab 12/23/20 1153  LIPASE 21   No results for input(s): AMMONIA in the last 168 hours. CBC: Recent Labs  Lab 12/23/20 1153 12/24/20 0440 12/25/20 0545 12/26/20 0508 12/27/20 0910  WBC 10.5 10.4 9.4 9.4 10.4  NEUTROABS 8.9*  --   --   --   --   HGB 9.3* 8.5* 8.7* 9.5* 9.6*  HCT 28.8* 25.3* 26.1* 29.9* 30.1*  MCV 97.0 92.7 92.2 95.5 95.9  PLT 126* 147* 141* 150 151   Cardiac Enzymes: No results for input(s): CKTOTAL, CKMB, CKMBINDEX, TROPONINI in the last 168 hours. CBG: Recent Labs  Lab 12/26/20 1216 12/26/20 1639 12/26/20 2124 12/27/20 0829 12/27/20 1110  GLUCAP 138* 269* 238* 108* 131*    Iron Studies:  Recent Labs    12/27/20 0515  FERRITIN 120   Studies/Results: No results found. . Chlorhexidine Gluconate Cloth  6 each Topical Daily  . darbepoetin (ARANESP) injection - NON-DIALYSIS  100 mcg Subcutaneous Q Tue-1800  . insulin aspart  0-5 Units Subcutaneous QHS  . insulin aspart  0-9 Units Subcutaneous TID WC  . mouth rinse  15 mL Mouth Rinse BID  . sodium bicarbonate  650 mg Oral BID    BMET    Component Value Date/Time   NA 144 12/27/2020 0515   K 3.5 12/27/2020 0515   CL 112 (H) 12/27/2020 0515   CO2 17 (L) 12/27/2020 0515   GLUCOSE 141 (H) 12/27/2020 0515   BUN 120 (H) 12/27/2020 0515   CREATININE 5.77 (H) 12/27/2020 0515   CREATININE 1.77 (H) 12/30/2019 1143   CALCIUM 6.5 (L) 12/27/2020 0515   GFRNONAA 9 (L) 12/27/2020 0515   GFRNONAA 36 (L) 12/30/2019 1143   GFRAA 42 (L) 12/30/2019 1143   CBC    Component Value Date/Time   WBC 10.4 12/27/2020 0910   RBC 3.14 (L) 12/27/2020 0910   HGB 9.6 (L) 12/27/2020 0910   HCT 30.1 (L) 12/27/2020 0910   PLT 151 12/27/2020 0910   MCV 95.9 12/27/2020 0910   MCH 30.6 12/27/2020 0910   MCHC 31.9 12/27/2020 0910   RDW 15.1 12/27/2020 0910   LYMPHSABS 0.8  12/23/2020 1153   MONOABS 0.6 12/23/2020 1153   EOSABS 0.1 12/23/2020 1153   BASOSABS 0.0 12/23/2020 1153     Assessment/Plan:  1. AKI/CKD stage IIIb - presumably due to ischemic ATN in setting of volume depletion, hypotension, and acute illness due to covid-19 PNA.  Now with increasing UOP to 1400 ml and Scr slowly improving from peak of 6.87 on 12/23/20.  No urgent indication for dialysis and hopefully will see continued improvement and he can avoid HD.  2. Covid-19 infection - started on remdesivir 3. HTN/volume - stable 4. Anemia of acute illness - follow H/H and transfuse prn. 5. Hypocalcemia/hypophosphatemia - will start calcium acetate and check iPTH/vitD levels 6. Acute metabolic encephalopathy - multifactorial.  Continue to treat metabolic derangements and follow.  Donetta Potts, MD [employer] [phone]

## 2020-12-28 DIAGNOSIS — E872 Acidosis: Secondary | ICD-10-CM | POA: Diagnosis not present

## 2020-12-28 DIAGNOSIS — U071 COVID-19: Secondary | ICD-10-CM | POA: Diagnosis not present

## 2020-12-28 DIAGNOSIS — N179 Acute kidney failure, unspecified: Secondary | ICD-10-CM | POA: Diagnosis not present

## 2020-12-28 LAB — GLUCOSE, CAPILLARY
Glucose-Capillary: 211 mg/dL — ABNORMAL HIGH (ref 70–99)
Glucose-Capillary: 247 mg/dL — ABNORMAL HIGH (ref 70–99)
Glucose-Capillary: 241 mg/dL — ABNORMAL HIGH (ref 70–99)
Glucose-Capillary: 231 mg/dL — ABNORMAL HIGH (ref 70–99)

## 2020-12-28 LAB — RENAL FUNCTION PANEL
Albumin: 2.3 g/dL — ABNORMAL LOW (ref 3.5–5.0)
Anion gap: 16 — ABNORMAL HIGH (ref 5–15)
BUN: 128 mg/dL — ABNORMAL HIGH (ref 8–23)
CO2: 15 mmol/L — ABNORMAL LOW (ref 22–32)
Calcium: 7 mg/dL — ABNORMAL LOW (ref 8.9–10.3)
Chloride: 111 mmol/L (ref 98–111)
Creatinine, Ser: 5.89 mg/dL — ABNORMAL HIGH (ref 0.61–1.24)
GFR, Estimated: 9 mL/min — ABNORMAL LOW (ref >60)
Glucose, Bld: 232 mg/dL — ABNORMAL HIGH (ref 70–99)
Phosphorus: 10.4 mg/dL — ABNORMAL HIGH (ref 2.5–4.6)
Potassium: 3.7 mmol/L (ref 3.5–5.1)
Sodium: 142 mmol/L (ref 135–145)

## 2020-12-28 LAB — VITAMIN D 25 HYDROXY (VIT D DEFICIENCY, FRACTURES): Vit D, 25-Hydroxy: 25.27 ng/mL — ABNORMAL LOW (ref 30–100)

## 2020-12-28 LAB — C-REACTIVE PROTEIN: CRP: 9 mg/dL — ABNORMAL HIGH (ref <1.0)

## 2020-12-28 LAB — PROTIME-INR
INR: 1.5 — ABNORMAL HIGH (ref 0.8–1.2)
Prothrombin Time: 18 seconds — ABNORMAL HIGH (ref 11.4–15.2)

## 2020-12-28 LAB — FERRITIN: Ferritin: 115 ng/mL (ref 24–336)

## 2020-12-28 MED ORDER — LACTATED RINGERS IV SOLN: INTRAVENOUS | Status: DC

## 2020-12-28 MED ORDER — SODIUM BICARBONATE 650 MG PO TABS
1300.0000 mg | ORAL_TABLET | Freq: Two times a day (BID) | ORAL | Status: DC
  Administered 2020-12-28 – 2021-01-24 (×55): 1300 mg via ORAL
  Filled 2020-12-28 (×57): qty 2

## 2020-12-28 MED ORDER — METOPROLOL TARTRATE 5 MG/5ML IV SOLN
5.0000 mg | Freq: Once | INTRAVENOUS | Status: AC
  Administered 2020-12-28: 5 mg via INTRAVENOUS
  Filled 2020-12-28: qty 5

## 2020-12-28 NOTE — Progress Notes (Signed)
[facility]  Plan for upper dialysis line tomorrow AM around 9. Called wife to discuss procedure risk of bleeding, infection, pneumothorax. INR ordered.  Notified ICU and AC for tomorrow.   Curlene Labrum, MD [facility] [address] [place], [place] [address] [phone] (office)

## 2020-12-28 NOTE — Progress Notes (Addendum)
Patient ID: Mark Hose., male   DOB: 1941-05-26, 80 y.o.   MRN: [id number] S: No events overnight O:BP (!) 166/75 (BP Location: Left Arm)   Pulse (!) 117   Temp 98.3 F (36.8 C) (Oral)   Resp 19   Wt 94.2 kg   SpO2 98%   BMI 31.12 kg/m   Intake/Output Summary (Last 24 hours) at 12/28/2020 1009 Last data filed at 12/28/2020 0600 Gross per 24 hour  Intake 240 ml  Output 550 ml  Net -310 ml   Intake/Output: I/O last 3 completed shifts: In: 960 [P.O.:960] Out: 1350 [Urine:1350]  Intake/Output this shift:  No intake/output data recorded. Weight change: 1.2 kg Gen: resting comfortably in bed Physical exam: unable to complete due to COVID + status.  In order to preserve PPE equipment and to minimize exposure to providers.  Notes from other caregivers reviewed   Recent Labs  Lab 12/23/20 1153 12/23/20 2056 12/24/20 0440 12/24/20 0853 12/24/20 1307 12/24/20 1727 12/25/20 0545 12/26/20 0508 12/27/20 0515 12/28/20 0421  NA 137   < > 143 141 140 142  141 144 144 144 142  K 4.4   < > 3.4* 3.3* 3.4* 3.3*  3.2* 3.3* 3.8 3.5 3.7  CL 114*   < > 113* 111 109 110  109 112* 113* 112* 111  CO2 7*   < > 13* 14* 14* 17*  15* 16* 15* 17* 15*  GLUCOSE 321*   < > 135* 148* 114* 140*  140* 85 115* 141* 232*  BUN 124*   < > 113* 99* 112* 110*  112* 114* 110* 120* 128*  CREATININE 6.87*   < > 6.29* 6.34* 6.10* 5.94*  5.94* 5.88* 5.89* 5.77* 5.89*  ALBUMIN 2.5*  --  2.3*  --   --  2.2* 2.2* 2.2* 2.4* 2.3*  CALCIUM 5.8*   < > 5.5* 5.2* 5.7* 5.8*  5.8* 5.9* 6.1* 6.5* 7.0*  PHOS  --   --   --   --   --  9.0* 9.2* 9.5* 10.4* 10.4*  AST 14*  --  16  --   --   --   --   --   --   --   ALT 27  --  23  --   --   --   --   --   --   --    < > = values in this interval not displayed.   Liver Function Tests: Recent Labs  Lab 12/23/20 1153 12/24/20 0440 12/24/20 1727 12/26/20 0508 12/27/20 0515 12/28/20 0421  AST 14* 16  --   --   --   --   ALT 27 23  --   --   --   --   ALKPHOS  101 88  --   --   --   --   BILITOT 0.5 0.4  --   --   --   --   PROT 5.7* 5.0*  --   --   --   --   ALBUMIN 2.5* 2.3*   < > 2.2* 2.4* 2.3*   < > = values in this interval not displayed.   Recent Labs  Lab 12/23/20 1153  LIPASE 21   No results for input(s): AMMONIA in the last 168 hours. CBC: Recent Labs  Lab 12/23/20 1153 12/24/20 0440 12/25/20 0545 12/26/20 0508 12/27/20 0910  WBC 10.5 10.4 9.4 9.4 10.4  NEUTROABS 8.9*  --   --   --   --  HGB 9.3* 8.5* 8.7* 9.5* 9.6*  HCT 28.8* 25.3* 26.1* 29.9* 30.1*  MCV 97.0 92.7 92.2 95.5 95.9  PLT 126* 147* 141* 150 151   Cardiac Enzymes: No results for input(s): CKTOTAL, CKMB, CKMBINDEX, TROPONINI in the last 168 hours. CBG: Recent Labs  Lab 12/27/20 0829 12/27/20 1110 12/27/20 1619 12/27/20 2158 12/28/20 0821  GLUCAP 108* 131* 175* 270* 211*    Iron Studies:  Recent Labs    12/28/20 0421  FERRITIN 115   Studies/Results: No results found. . calcium carbonate  800 mg of elemental calcium Oral TID WC  . Chlorhexidine Gluconate Cloth  6 each Topical Daily  . darbepoetin (ARANESP) injection - NON-DIALYSIS  100 mcg Subcutaneous Q Tue-1800  . insulin aspart  0-5 Units Subcutaneous QHS  . insulin aspart  0-9 Units Subcutaneous TID WC  . mouth rinse  15 mL Mouth Rinse BID  . sodium bicarbonate  1,300 mg Oral BID    BMET    Component Value Date/Time   NA 142 12/28/2020 0421   K 3.7 12/28/2020 0421   CL 111 12/28/2020 0421   CO2 15 (L) 12/28/2020 0421   GLUCOSE 232 (H) 12/28/2020 0421   BUN 128 (H) 12/28/2020 0421   CREATININE 5.89 (H) 12/28/2020 0421   CREATININE 1.77 (H) 12/30/2019 1143   CALCIUM 7.0 (L) 12/28/2020 0421   GFRNONAA 9 (L) 12/28/2020 0421   GFRNONAA 36 (L) 12/30/2019 1143   GFRAA 42 (L) 12/30/2019 1143   CBC    Component Value Date/Time   WBC 10.4 12/27/2020 0910   RBC 3.14 (L) 12/27/2020 0910   HGB 9.6 (L) 12/27/2020 0910   HCT 30.1 (L) 12/27/2020 0910   PLT 151 12/27/2020 0910   MCV  95.9 12/27/2020 0910   MCH 30.6 12/27/2020 0910   MCHC 31.9 12/27/2020 0910   RDW 15.1 12/27/2020 0910   LYMPHSABS 0.8 12/23/2020 1153   MONOABS 0.6 12/23/2020 1153   EOSABS 0.1 12/23/2020 1153   BASOSABS 0.0 12/23/2020 1153     Assessment/Plan:  1. AKI/CKD stage IIIb - presumably due to ischemic ATN in setting of volume depletion, hypotension, and acute illness due to covid-19 PNA.  Now with increasing UOP to 1400 ml and Scr slowly improving from peak of 6.87 on 12/23/20 and now 5.8.  No urgent indication for dialysis and hopefully will see continued improvement and he can avoid HD.  UOP has dropped so will restart IVF's and follow (po intake has also dropped). 1. Will ask surgery to put him on the schedule for a temporary HD catheter tomorrow morning in case his renal function does not improve with IVF's.  2. Covid-19 infection - started on remdesivir 3. HTN/volume - stable 4. Anemia of acute illness - follow H/H and transfuse prn. 5. Hypocalcemia/hypophosphatemia - started calcium carbonate yesterday and check iPTH/vitD levels today 6. Acute metabolic encephalopathy - multifactorial.  Continue to treat metabolic derangements and follow.   Donetta Potts, MD [employer] [phone]

## 2020-12-28 NOTE — Progress Notes (Signed)
PROGRESS NOTE    [name]  SWF:[id number] DOB: February 13, 1941 DOA: 12/23/2020 PCP: [name], MD   Brief Narrative:   [name] Sr.is a 80 y.o.malewith medical history significant fortype 2 diabetes, CKD stage IIIb, hypertension, dyslipidemia, diastolic CHF, and CAD described to the ED via EMS with increasing weakness and confusion along with poor appetite for the last 2 to 3 days.Patient was admitted with AKI and severe acidosis along with hyperglycemia.  He continues to have persistent AKI with uremic encephalopathy.  He is also noted to be COVID-positive and has been started on remdesivir.  Assessment & Plan:   Active Problems:   AKI (acute kidney injury) ([place])   Generalized weakness with acute metabolic encephalopathy secondary to AKI with severe metabolic acidosis and uncompensated respiratory alkalosis -Suspect ongoing uremic encephalopathy -acidosis due to AKI; sodium bicarbonate dose adjusted as mentioned below; patient minded receiving dialysis treatment. -mentation stable currently; will transfer to telemetry bed.  Non-oliguricAKIon CKD3b-suspect prerenal with dehydration -Holding on further IV fluids currently; good urine output demonstrated. -Renal ultrasoundwithout hydronephrosis -continue monitoring strict I's and O's  -Continue avoiding nephrotoxic agents, hypotension and the use of contrast. -Appreciate ongoing nephrology evaluation; in the setting of worsening renal function, decreased urine output and increased BUN; temporary dialysis catheter will be placed by general surgery on 12/29/2020. -patient might ended needing HD. -Metabolic acidosis in the setting of acute renal failure; sodium bicarbonate dose has been adjusted.  Hypocalcemia -resolved -Continue to follow electrolytes trend -Continue telemetry monitoring.  Hyperglycemiain setting of DM2with mild DKA -resolved -Compliant with home medications according to wife at  bedside -Likely stress-induced -Hemoglobin A1c7.0% demonstrating good control. -Continue sliding scale insulin and follow CBGs fluctuation.  COVID-19 infection -Chest x-ray with stable cardiomegaly and no other signs of infiltrate -Patient received 3 days of remdesivir for asymptomatic COVID-19 infection. -No need for steroids given no hypoxemia -Continue to trend CRP and ferritin -Continue isolation precautions -Patient is afebrile, in no distress and denies shortness of breath currently.  Thrombocytopenia -stable -Continue to monitor platelet count trend. -Continue avoiding heparin agents  Hypertension/CAD -Continue holding home medications for now until mentation further improves and his renal function further stabilize. -Continue to follow vital signs.  Hard of hearing -Patient uses hearing aids at baseline.   DVT prophylaxis:SCDs Code Status:Full Family Communication:Unable to reach wife over the phone. Disposition Plan: Status is: Inpatient  Remains inpatient appropriate because:Altered mental status, IV treatments appropriate due to intensity of illness or inability to take PO and Inpatient level of care appropriate due to severity of illness   Dispo: The patient is from:Home Anticipated d/c is [facility] Patient currently is not medically stable to d/c. Difficult to place patient No   Consultants:  Nephrology  Procedures:  See below  Antimicrobials:  Anti-infectives (From admission, onward)   Start     Dose/Rate Route Frequency Ordered Stop   12/24/20 1000  remdesivir 100 mg in sodium chloride 0.9 % 100 mL IVPB        100 mg 200 mL/hr over 30 Minutes Intravenous Daily 12/23/20 1637 12/27/20 1200   12/23/20 1645  remdesivir 100 mg in sodium chloride 0.9 % 100 mL IVPB        100 mg 200 mL/hr over 30 Minutes Intravenous Every 1 hr x 2 12/23/20 1636 12/23/20 1908      Subjective: Overnight events  reported; oriented x2 intermittently and in no major distress.  Decreased urine output appreciated.  Creatinine and BUN trending up.  Objective: Vitals:   12/28/20 0500 12/28/20 0800 12/28/20 0900 12/28/20 1123  BP:  (!) 152/71 (!) 166/75   Pulse:  86 (!) 117 94  Resp:  17 19 18   Temp:    98.6 F (37 C)  TempSrc:    Oral  SpO2:  95% 98% 99%  Weight: 94.2 kg       Intake/Output Summary (Last 24 hours) at 12/28/2020 1734 Last data filed at 12/28/2020 0600 Gross per 24 hour  Intake 240 ml  Output 550 ml  Net -310 ml   Filed Weights   12/26/20 0444 12/27/20 0500 12/28/20 0500  Weight: 92.1 kg 93 kg 94.2 kg    Examination: General exam: Alert, awake, oriented x 2 intermittently; hard of hearing, no shortness of breath, no chest pain, no nausea, no vomiting.  Good saturation on room air. Respiratory system: Good air movement bilaterally; no using accessory muscle.  There is no wheezing or crackles appreciated on exam.  Positive rhonchi. Cardiovascular system:RRR. No murmurs, rubs, gallops.  No JVD. Gastrointestinal system: Abdomen is nondistended, soft and nontender. No organomegaly or masses felt. Normal bowel sounds heard. Central nervous system: Alert and oriented. No focal neurological deficits. Extremities: No cyanosis or clubbing. Skin: No petechiae. Psychiatry: Mood & affect appropriate.    Data Reviewed: I have personally reviewed following labs and imaging studies  CBC: Recent Labs  Lab 12/23/20 1153 12/24/20 0440 12/25/20 0545 12/26/20 0508 12/27/20 0910  WBC 10.5 10.4 9.4 9.4 10.4  NEUTROABS 8.9*  --   --   --   --   HGB 9.3* 8.5* 8.7* 9.5* 9.6*  HCT 28.8* 25.3* 26.1* 29.9* 30.1*  MCV 97.0 92.7 92.2 95.5 95.9  PLT 126* 147* 141* 150 383   Basic Metabolic Panel: Recent Labs  Lab 12/24/20 0440 12/24/20 0853 12/24/20 1727 12/25/20 0545 12/26/20 0508 12/27/20 0515 12/28/20 0421  NA 143   < > 142  141 144 144 144 142  K 3.4*   < > 3.3*  3.2* 3.3* 3.8  3.5 3.7  CL 113*   < > 110  109 112* 113* 112* 111  CO2 13*   < > 17*  15* 16* 15* 17* 15*  GLUCOSE 135*   < > 140*  140* 85 115* 141* 232*  BUN 113*   < > 110*  112* 114* 110* 120* 128*  CREATININE 6.29*   < > 5.94*  5.94* 5.88* 5.89* 5.77* 5.89*  CALCIUM 5.5*   < > 5.8*  5.8* 5.9* 6.1* 6.5* 7.0*  MG 1.2*  --   --  1.5* 2.0 2.1  --   PHOS  --   --  9.0* 9.2* 9.5* 10.4* 10.4*   < > = values in this interval not displayed.   GFR: Estimated Creatinine Clearance: 11.4 mL/min (A) (by C-G formula based on SCr of 5.89 mg/dL (H)).   Liver Function Tests: Recent Labs  Lab 12/23/20 1153 12/24/20 0440 12/24/20 1727 12/25/20 0545 12/26/20 0508 12/27/20 0515 12/28/20 0421  AST 14* 16  --   --   --   --   --   ALT 27 23  --   --   --   --   --   ALKPHOS 101 88  --   --   --   --   --   BILITOT 0.5 0.4  --   --   --   --   --   PROT 5.7* 5.0*  --   --   --   --   --  ALBUMIN 2.5* 2.3* 2.2* 2.2* 2.2* 2.4* 2.3*   Recent Labs  Lab 12/23/20 1153  LIPASE 21   CBG: Recent Labs  Lab 12/27/20 1110 12/27/20 1619 12/27/20 2158 12/28/20 0821 12/28/20 1120  GLUCAP 131* 175* 270* 211* 247*   Anemia Panel: Recent Labs    12/27/20 0515 12/28/20 0421  FERRITIN 120 115   Sepsis Labs: Recent Labs  Lab 12/23/20 1510 12/23/20 1728  LATICACIDVEN 1.1 0.8    Recent Results (from the past 240 hour(s))  Resp Panel by RT-PCR (Flu A&B, Covid) Nasopharyngeal Swab     Status: Abnormal   Collection Time: 12/23/20 12:06 PM   Specimen: Nasopharyngeal Swab; Nasopharyngeal(NP) swabs in vial transport medium  Result Value Ref Range Status   SARS Coronavirus 2 by RT PCR POSITIVE (A) NEGATIVE Final    Comment: CRITICAL RESULT CALLED TO, READ BACK BY AND VERIFIED WITH: OAKLEY,B 1323 12/23/2020 COLEMAN,R (NOTE) SARS-CoV-2 target nucleic acids are DETECTED.  The SARS-CoV-2 RNA is generally detectable in upper respiratory specimens during the acute phase of infection. Positive results  are indicative of the presence of the identified virus, but do not rule out bacterial infection or co-infection with other pathogens not detected by the test. Clinical correlation with patient history and other diagnostic information is necessary to determine patient infection status. The expected result is Negative.  Fact Sheet for Patients: EntrepreneurPulse.com.au  Fact Sheet for Healthcare Providers: IncredibleEmployment.be  This test is not yet approved or cleared by the [place] FDA and  has been authorized for detection and/or diagnosis of SARS-CoV-2 by FDA under an Emergency Use Authorization (EUA).  This EUA will remain in effect (meaning this t est can be used) for the duration of  the COVID-19 declaration under Section 564(b)(1) of the Act, 21 U.S.C. section 360bbb-3(b)(1), unless the authorization is terminated or revoked sooner.     Influenza A by PCR NEGATIVE NEGATIVE Final   Influenza B by PCR NEGATIVE NEGATIVE Final    Comment: (NOTE) The Xpert Xpress SARS-CoV-2/FLU/RSV plus assay is intended as an aid in the diagnosis of influenza from Nasopharyngeal swab specimens and should not be used as a sole basis for treatment. Nasal washings and aspirates are unacceptable for Xpert Xpress SARS-CoV-2/FLU/RSV testing.  Fact Sheet for Patients: EntrepreneurPulse.com.au  Fact Sheet for Healthcare Providers: IncredibleEmployment.be  This test is not yet approved or cleared by the [place] FDA and has been authorized for detection and/or diagnosis of SARS-CoV-2 by FDA under an Emergency Use Authorization (EUA). This EUA will remain in effect (meaning this test can be used) for the duration of the COVID-19 declaration under Section 564(b)(1) of the Act, 21 U.S.C. section 360bbb-3(b)(1), unless the authorization is terminated or revoked.  Performed at [facility], [address]  [place], [place] [address]   Urine culture     Status: Abnormal   Collection Time: 12/23/20  6:28 PM   Specimen: Urine, Clean Catch  Result Value Ref Range Status   Specimen Description   Final    URINE, CLEAN CATCH Performed at [facility], [address], [place], [place] [address]    Special Requests   Final    NONE Performed at [facility], [address], [place], [place] [address]    Culture (A)  Final    <10,000 COLONIES/mL INSIGNIFICANT GROWTH Performed at [facility], [place] [address] [place], [place] [address]    Report Status 12/25/2020 FINAL  Final  MRSA PCR Screening     Status: None  Collection Time: 12/23/20  9:59 PM   Specimen: Nasal Mucosa; Nasopharyngeal  Result Value Ref Range Status   MRSA by PCR NEGATIVE NEGATIVE Final    Comment:        The GeneXpert MRSA Assay (FDA approved for NASAL specimens only), is one component of a comprehensive MRSA colonization surveillance program. It is not intended to diagnose MRSA infection nor to guide or monitor treatment for MRSA infections. Performed at [facility], [address] [place], [place] [address]      Radiology Studies: No results found.  Scheduled Meds: . calcium carbonate  800 mg of elemental calcium Oral TID WC  . Chlorhexidine Gluconate Cloth  6 each Topical Daily  . darbepoetin (ARANESP) injection - NON-DIALYSIS  100 mcg Subcutaneous Q Tue-1800  . insulin aspart  0-5 Units Subcutaneous QHS  . insulin aspart  0-9 Units Subcutaneous TID WC  . mouth rinse  15 mL Mouth Rinse BID  . sodium bicarbonate  1,300 mg Oral BID   Continuous Infusions: . insulin Stopped (12/24/20 2100)  . lactated ringers 50 mL/hr at 12/28/20 1250     LOS: 5 days    Time spent: 35 minutes    Barton Dubois, MD [facility]  If 7PM-7AM, please contact night-coverage [email or web address] 12/28/2020, 5:34 PM

## 2020-12-29 ENCOUNTER — Inpatient Hospital Stay (HOSPITAL_COMMUNITY): Payer: PPO

## 2020-12-29 ENCOUNTER — Ambulatory Visit: Payer: PPO | Admitting: Family Medicine

## 2020-12-29 DIAGNOSIS — E872 Acidosis: Secondary | ICD-10-CM | POA: Diagnosis not present

## 2020-12-29 DIAGNOSIS — U071 COVID-19: Secondary | ICD-10-CM | POA: Diagnosis not present

## 2020-12-29 DIAGNOSIS — N179 Acute kidney failure, unspecified: Secondary | ICD-10-CM | POA: Diagnosis not present

## 2020-12-29 LAB — CBC
HCT: 26.2 % — ABNORMAL LOW (ref 39.0–52.0)
Hemoglobin: 8.8 g/dL — ABNORMAL LOW (ref 13.0–17.0)
MCH: 31 pg (ref 26.0–34.0)
MCHC: 33.6 g/dL (ref 30.0–36.0)
MCV: 92.3 fL (ref 80.0–100.0)
Platelets: 186 10*3/uL (ref 150–400)
RBC: 2.84 MIL/uL — ABNORMAL LOW (ref 4.22–5.81)
RDW: 14.4 % (ref 11.5–15.5)
WBC: 16.4 10*3/uL — ABNORMAL HIGH (ref 4.0–10.5)
nRBC: 0 % (ref 0.0–0.2)

## 2020-12-29 LAB — RENAL FUNCTION PANEL
Albumin: 2.3 g/dL — ABNORMAL LOW (ref 3.5–5.0)
Anion gap: 15 (ref 5–15)
BUN: 142 mg/dL — ABNORMAL HIGH (ref 8–23)
CO2: 14 mmol/L — ABNORMAL LOW (ref 22–32)
Calcium: 6.8 mg/dL — ABNORMAL LOW (ref 8.9–10.3)
Chloride: 113 mmol/L — ABNORMAL HIGH (ref 98–111)
Creatinine, Ser: 5.92 mg/dL — ABNORMAL HIGH (ref 0.61–1.24)
GFR, Estimated: 9 mL/min — ABNORMAL LOW (ref >60)
Glucose, Bld: 207 mg/dL — ABNORMAL HIGH (ref 70–99)
Phosphorus: 8.8 mg/dL — ABNORMAL HIGH (ref 2.5–4.6)
Potassium: 3.4 mmol/L — ABNORMAL LOW (ref 3.5–5.1)
Sodium: 142 mmol/L (ref 135–145)

## 2020-12-29 LAB — GLUCOSE, CAPILLARY
Glucose-Capillary: 215 mg/dL — ABNORMAL HIGH (ref 70–99)
Glucose-Capillary: 277 mg/dL — ABNORMAL HIGH (ref 70–99)
Glucose-Capillary: 231 mg/dL — ABNORMAL HIGH (ref 70–99)
Glucose-Capillary: 183 mg/dL — ABNORMAL HIGH (ref 70–99)

## 2020-12-29 LAB — PARATHYROID HORMONE, INTACT (NO CA): PTH: 298 pg/mL — ABNORMAL HIGH (ref 15–65)

## 2020-12-29 LAB — C-REACTIVE PROTEIN: CRP: 8 mg/dL — ABNORMAL HIGH (ref <1.0)

## 2020-12-29 LAB — FERRITIN: Ferritin: 123 ng/mL (ref 24–336)

## 2020-12-29 MED ORDER — SODIUM CHLORIDE 0.9 % IV SOLN: 100.0000 mL | INTRAVENOUS | Status: DC | PRN

## 2020-12-29 MED ORDER — METOPROLOL TARTRATE 5 MG/5ML IV SOLN
5.0000 mg | Freq: Once | INTRAVENOUS | Status: AC
  Administered 2020-12-29: 5 mg via INTRAVENOUS
  Filled 2020-12-29: qty 5

## 2020-12-29 MED ORDER — CHLORHEXIDINE GLUCONATE CLOTH 2 % EX PADS
6.0000 | MEDICATED_PAD | Freq: Every day | CUTANEOUS | Status: DC
  Administered 2020-12-30 – 2021-01-15 (×16): 6 via TOPICAL

## 2020-12-29 MED ORDER — LIDOCAINE HCL (PF) 1 % IJ SOLN
INTRAMUSCULAR | Status: AC
  Administered 2020-12-29: 5 mL
  Filled 2020-12-29: qty 5

## 2020-12-29 MED ORDER — ALTEPLASE 2 MG IJ SOLR
2.0000 mg | Freq: Once | INTRAMUSCULAR | Status: AC | PRN
  Administered 2021-01-08: 4 mg

## 2020-12-29 MED ORDER — HEPARIN SODIUM (PORCINE) 1000 UNIT/ML DIALYSIS
1000.0000 [IU] | INTRAMUSCULAR | Status: DC | PRN
  Filled 2020-12-29: qty 1

## 2020-12-29 NOTE — Progress Notes (Signed)
Patient ID: Mark Hose., male   DOB: Oct 03, 1940, 80 y.o.   MRN: [id number] S: No events overnight but UOP has improved after starting IVF's. O:BP (!) 155/78   Pulse (!) 119   Temp 98.4 F (36.9 C) (Oral)   Resp (!) 21   Wt 93.1 kg   SpO2 98%   BMI 30.75 kg/m   Intake/Output Summary (Last 24 hours) at 12/29/2020 0941 Last data filed at 12/29/2020 0600 Gross per 24 hour  Intake --  Output 1400 ml  Net -1400 ml   Intake/Output: I/O last 3 completed shifts: In: 240 [P.O.:240] Out: 1950 [Urine:1950]  Intake/Output this shift:  No intake/output data recorded. Weight change: -1.1 kg Gen: resting comfortably in bed Physical exam: unable to complete due to COVID + status.  In order to preserve PPE equipment and to minimize exposure to providers.  Notes from other caregivers reviewed   Recent Labs  Lab 12/23/20 1153 12/23/20 2056 12/24/20 0440 12/24/20 7681 12/24/20 1307 12/24/20 1727 12/25/20 0545 12/26/20 0508 12/27/20 0515 12/28/20 0421 12/29/20 0414  NA 137   < > 143   < > 140 142  141 144 144 144 142 142  K 4.4   < > 3.4*   < > 3.4* 3.3*  3.2* 3.3* 3.8 3.5 3.7 3.4*  CL 114*   < > 113*   < > 109 110  109 112* 113* 112* 111 113*  CO2 7*   < > 13*   < > 14* 17*  15* 16* 15* 17* 15* 14*  GLUCOSE 321*   < > 135*   < > 114* 140*  140* 85 115* 141* 232* 207*  BUN 124*   < > 113*   < > 112* 110*  112* 114* 110* 120* 128* 142*  CREATININE 6.87*   < > 6.29*   < > 6.10* 5.94*  5.94* 5.88* 5.89* 5.77* 5.89* 5.92*  ALBUMIN 2.5*  --  2.3*  --   --  2.2* 2.2* 2.2* 2.4* 2.3* 2.3*  CALCIUM 5.8*   < > 5.5*   < > 5.7* 5.8*  5.8* 5.9* 6.1* 6.5* 7.0* 6.8*  PHOS  --   --   --   --   --  9.0* 9.2* 9.5* 10.4* 10.4* 8.8*  AST 14*  --  16  --   --   --   --   --   --   --   --   ALT 27  --  23  --   --   --   --   --   --   --   --    < > = values in this interval not displayed.   Liver Function Tests: Recent Labs  Lab 12/23/20 1153 12/24/20 0440 12/24/20 1727  12/27/20 0515 12/28/20 0421 12/29/20 0414  AST 14* 16  --   --   --   --   ALT 27 23  --   --   --   --   ALKPHOS 101 88  --   --   --   --   BILITOT 0.5 0.4  --   --   --   --   PROT 5.7* 5.0*  --   --   --   --   ALBUMIN 2.5* 2.3*   < > 2.4* 2.3* 2.3*   < > = values in this interval not displayed.   Recent Labs  Lab 12/23/20 1153  LIPASE  21   No results for input(s): AMMONIA in the last 168 hours. CBC: Recent Labs  Lab 12/23/20 1153 12/24/20 0440 12/25/20 0545 12/26/20 0508 12/27/20 0910  WBC 10.5 10.4 9.4 9.4 10.4  NEUTROABS 8.9*  --   --   --   --   HGB 9.3* 8.5* 8.7* 9.5* 9.6*  HCT 28.8* 25.3* 26.1* 29.9* 30.1*  MCV 97.0 92.7 92.2 95.5 95.9  PLT 126* 147* 141* 150 151   Cardiac Enzymes: No results for input(s): CKTOTAL, CKMB, CKMBINDEX, TROPONINI in the last 168 hours. CBG: Recent Labs  Lab 12/28/20 0821 12/28/20 1120 12/28/20 1740 12/28/20 2206 12/29/20 0808  GLUCAP 211* 247* 241* 231* 215*    Iron Studies:  Recent Labs    12/29/20 0414  FERRITIN 123   Studies/Results: No results found. . calcium carbonate  800 mg of elemental calcium Oral TID WC  . Chlorhexidine Gluconate Cloth  6 each Topical Daily  . darbepoetin (ARANESP) injection - NON-DIALYSIS  100 mcg Subcutaneous Q Tue-1800  . insulin aspart  0-5 Units Subcutaneous QHS  . insulin aspart  0-9 Units Subcutaneous TID WC  . mouth rinse  15 mL Mouth Rinse BID  . sodium bicarbonate  1,300 mg Oral BID    BMET    Component Value Date/Time   NA 142 12/29/2020 0414   K 3.4 (L) 12/29/2020 0414   CL 113 (H) 12/29/2020 0414   CO2 14 (L) 12/29/2020 0414   GLUCOSE 207 (H) 12/29/2020 0414   BUN 142 (H) 12/29/2020 0414   CREATININE 5.92 (H) 12/29/2020 0414   CREATININE 1.77 (H) 12/30/2019 1143   CALCIUM 6.8 (L) 12/29/2020 0414   GFRNONAA 9 (L) 12/29/2020 0414   GFRNONAA 36 (L) 12/30/2019 1143   GFRAA 42 (L) 12/30/2019 1143   CBC    Component Value Date/Time   WBC 10.4 12/27/2020 0910    RBC 3.14 (L) 12/27/2020 0910   HGB 9.6 (L) 12/27/2020 0910   HCT 30.1 (L) 12/27/2020 0910   PLT 151 12/27/2020 0910   MCV 95.9 12/27/2020 0910   MCH 30.6 12/27/2020 0910   MCHC 31.9 12/27/2020 0910   RDW 15.1 12/27/2020 0910   LYMPHSABS 0.8 12/23/2020 1153   MONOABS 0.6 12/23/2020 1153   EOSABS 0.1 12/23/2020 1153   BASOSABS 0.0 12/23/2020 1153    Assessment/Plan:  1. AKI/CKD stage IIIb - presumably due to ischemic ATN in setting of volume depletion, hypotension, and acute illness due to covid-19 PNA. Now with increasing UOP to 1400 ml and Scr slowly improving from peak of 6.87 on 12/23/20 and has stabilized around 5.9, however his BUN has continued to climb and is now 142. 1. I have discussed case with Drs. Dyann Kief and [place] who will place temp HD catheter today. 2. Will plan to initiate HD today and again tomorrow without UF and only for clearance and follow his mental status. 3. Continue with IVF's for now given his poor po intake.  2. Covid-19 infection - started on remdesivir 3. HTN/volume - stable 4. Anemia of acute illness - follow H/H and transfuse prn. 5. Hypocalcemia/hypophosphatemia - started calcium carbonate yesterday and check iPTH/vitD levels today 6. Acute metabolic encephalopathy - multifactorial. Continue to treat metabolic derangements and follow.  Will attempt a trial of HD to see if this will help his mental status.  Donetta Potts, MD [employer] [phone]

## 2020-12-29 NOTE — Care Management Important Message (Signed)
Important Message  Patient Details  Name: Mark TOMARO Sr. MRN: [id number] Date of Birth: 03-23-1941   Medicare Important Message Given:  Yes     Tommy Medal 12/29/2020, 11:18 AM

## 2020-12-29 NOTE — Procedures (Signed)
   INITIAL HEMODIALYSIS TREATMENT NOTE:  First ever HD session completed via [facility] temporary catheter. Kept even / no ultrafiltration as per order.  Catheter tolerated prescribed flow with stable pressures, although lines had to be reversed as arterial port would not aspirate.  BP stable throughout session but pt had frequent bursts of non-sustained Afib/RVR 120-142.  Asymptomatic.  All blood was returned.  Post HD pt remained tachycardic 135-140 sustained.  Discussed with primary RN Jeri Modena who messaged attending via secure chat.  Rockwell Alexandria, RN

## 2020-12-29 NOTE — Procedures (Signed)
Procedure Note  12/29/20   Preoperative Diagnosis:  Acute renal failure    Postoperative Diagnosis: Same   Procedure(s) Performed: Central Line placement, left Internal jugular    Surgeon: Lanell Matar. Constance Haw, MD   Assistants: None   Anesthesia: 1% lidocaine    Complications: None    Indications: [name] is a 80 y.o. with acute renal failure in the setting of COVID. I discussed the risk and benefits of placement of the central line with him and left a message for his wife, including but not limited to bleeding, infection, and risk of pneumothorax. He has given verbal consent for the procedure.    Procedure: The patient placed supine. The left chest and neck was prepped and draped in the usual sterile fashion.  Wearing full gown and gloves, I performed the procedure.  One percent lidocaine was used for local anesthesia. An ultrasound was utilized to assess the jugular vein.  The needle with syringe was advanced into the vein with dark venous return, and a wire was placed using the Seldinger technique without difficulty.  Ectopia was not noted.  The skin was knicked and a dilator was placed, and the three lumen dialysis catheter was placed over the wire with continued control of the wire.  There was good draw back of blood from the blue lumen and the extra lumen but the red lumen had some difficulty pulling back. all lumens flushed easily with saline.  The catheter was secured in 4 points with 2-0 silk and a biopatch and dressing was placed.     The patient tolerated the procedure well, and the CXR was ordered to confirm position of the central line.   Curlene Labrum, MD [facility] [address] [place], [place] [address] [phone] (office)

## 2020-12-29 NOTE — Progress Notes (Signed)
[facility]  CXR completed and in good position and no PTX.  Curlene Labrum, MD

## 2020-12-29 NOTE — Progress Notes (Signed)
PROGRESS NOTE    [name]  PRF:[id number] DOB: 22-May-1941 DOA: 12/23/2020 PCP: [name], MD   Brief Narrative:   Mark Dicksonis a 80 y.o.malewith medical history significant fortype 2 diabetes, CKD stage IIIb, hypertension, dyslipidemia, diastolic CHF, and CAD described to the ED via EMS with increasing weakness and confusion along with poor appetite for the last 2 to 3 days.Patient was admitted with AKI and severe acidosis along with hyperglycemia.  He continues to have persistent AKI with uremic encephalopathy.  He is also noted to be COVID-positive and has been started on remdesivir.  Assessment & Plan:   Active Problems:   AKI (acute kidney injury) ([place])   Generalized weakness with acute metabolic encephalopathy secondary to AKI with severe metabolic acidosis and uncompensated respiratory alkalosis -Suspect ongoing uremic encephalopathy -acidosis due to AKI; sodium bicarbonate dose adjusted as mentioned below; patient minded receiving dialysis treatment. -Planning dialysis initiation later today. -mentation has remained stable; patient cooperative with examination.  Non-oliguricAKIon CKD3b-suspect prerenal with dehydration -Holding on further IV fluids currently; good urine output demonstrated. -Renal ultrasoundwithout hydronephrosis -continue monitoring strict I's and O's  -Continue avoiding nephrotoxic agents, hypotension and the use of contrast. -Appreciate ongoing nephrology evaluation; in the setting of worsening renal function. -General surgery placed left internal jugular dialysis catheter 12/29/2020; no pneumothorax and in good position.  Plan is for dialysis to be started later today.   -Metabolic acidosis in the setting of acute renal failure; sodium bicarbonate dose has been adjusted.  Hypocalcemia -resolved -Continue to follow electrolytes trend -Continue telemetry monitoring.  Hyperglycemiain setting of DM2with mild  DKA -resolved -Compliant with home medications according to wife at bedside -Likely stress-induced -Hemoglobin A1c7.0% demonstrating good control. -Continue sliding scale insulin and follow CBGs fluctuation.  COVID-19 infection -Chest x-ray with stable cardiomegaly and no other signs of infiltrate -Patient received 3 days of remdesivir for asymptomatic COVID-19 infection. -No need for steroids given no hypoxemia -Continue to trend CRP and ferritin -Continue isolation precautions -Patient is afebrile, in no distress and denies shortness of breath currently.  Thrombocytopenia -stable -Continue to monitor platelet count trend. -Continue avoiding heparin agents  Hypertension/CAD -Continue holding home medications for now until mentation further improves and his renal function further stabilize. -Continue to follow vital signs.  Hard of hearing -Patient uses hearing aids at baseline.   DVT prophylaxis:SCDs Code Status:Full Family Communication:Unable to reach wife over the phone. Disposition Plan: Status is: Inpatient  Remains inpatient appropriate because:Altered mental status, IV treatments appropriate due to intensity of illness or inability to take PO and Inpatient level of care appropriate due to severity of illness   Dispo: The patient is from:Home Anticipated d/c is DJ:TTSV Patient currently is not medically stable to d/c. Difficult to place patient No   Consultants:  Nephrology  General surgery  Procedures:  See below  Antimicrobials:  Anti-infectives (From admission, onward)   Start     Dose/Rate Route Frequency Ordered Stop   12/24/20 1000  remdesivir 100 mg in sodium chloride 0.9 % 100 mL IVPB        100 mg 200 mL/hr over 30 Minutes Intravenous Daily 12/23/20 1637 12/27/20 1200   12/23/20 1645  remdesivir 100 mg in sodium chloride 0.9 % 100 mL IVPB        100 mg 200 mL/hr over 30 Minutes  Intravenous Every 1 hr x 2 12/23/20 1636 12/23/20 1908      Subjective: Sudden improvement in urine output appreciated; creatinine and BUN  continue climbing.  Patient denies chest pain, nausea and vomiting.  No shortness of breath status post left internal jugular dialysis catheter.  Objective: Vitals:   12/29/20 0000 12/29/20 0500 12/29/20 0806 12/29/20 1130  BP: (!) 155/78     Pulse: (!) 118  (!) 119   Resp: 16  (!) 21   Temp:   98.4 F (36.9 C) 98 F (36.7 C)  TempSrc:   Oral   SpO2: 97%  98%   Weight:  93.1 kg      Intake/Output Summary (Last 24 hours) at 12/29/2020 1816 Last data filed at 12/29/2020 1700 Gross per 24 hour  Intake 360 ml  Output 1950 ml  Net -1590 ml   Filed Weights   12/27/20 0500 12/28/20 0500 12/29/20 0500  Weight: 93 kg 94.2 kg 93.1 kg    Examination: General exam: Alert, awake, cooperative with examination and in no acute distress.  Denies chest pain, nausea or vomiting.  Good oxygen saturation on room air. Respiratory system: No wheezing or crackles appreciated on exam.  No using accessory muscle.  Positive scattered rhonchi Cardiovascular system:RRR. No murmurs, rubs, gallops.  No JVD on exam. Gastrointestinal system: Abdomen is nondistended, soft and nontender. No organomegaly or masses felt. Normal bowel sounds heard. Central nervous system: No focal neurological deficits. Extremities: No cyanosis or clubbing. Skin: No petechiae. Psychiatry: Mood & affect appropriate.    Data Reviewed: I have personally reviewed following labs and imaging studies  CBC: Recent Labs  Lab 12/23/20 1153 12/24/20 0440 12/25/20 0545 12/26/20 0508 12/27/20 0910  WBC 10.5 10.4 9.4 9.4 10.4  NEUTROABS 8.9*  --   --   --   --   HGB 9.3* 8.5* 8.7* 9.5* 9.6*  HCT 28.8* 25.3* 26.1* 29.9* 30.1*  MCV 97.0 92.7 92.2 95.5 95.9  PLT 126* 147* 141* 150 390   Basic Metabolic Panel: Recent Labs  Lab 12/24/20 0440 12/24/20 0853 12/25/20 0545 12/26/20 0508  12/27/20 0515 12/28/20 0421 12/29/20 0414  NA 143   < > 144 144 144 142 142  K 3.4*   < > 3.3* 3.8 3.5 3.7 3.4*  CL 113*   < > 112* 113* 112* 111 113*  CO2 13*   < > 16* 15* 17* 15* 14*  GLUCOSE 135*   < > 85 115* 141* 232* 207*  BUN 113*   < > 114* 110* 120* 128* 142*  CREATININE 6.29*   < > 5.88* 5.89* 5.77* 5.89* 5.92*  CALCIUM 5.5*   < > 5.9* 6.1* 6.5* 7.0* 6.8*  MG 1.2*  --  1.5* 2.0 2.1  --   --   PHOS  --    < > 9.2* 9.5* 10.4* 10.4* 8.8*   < > = values in this interval not displayed.   GFR: Estimated Creatinine Clearance: 11.3 mL/min (A) (by C-G formula based on SCr of 5.92 mg/dL (H)).   Liver Function Tests: Recent Labs  Lab 12/23/20 1153 12/24/20 0440 12/24/20 1727 12/25/20 0545 12/26/20 0508 12/27/20 0515 12/28/20 0421 12/29/20 0414  AST 14* 16  --   --   --   --   --   --   ALT 27 23  --   --   --   --   --   --   ALKPHOS 101 88  --   --   --   --   --   --   BILITOT 0.5 0.4  --   --   --   --   --   --  PROT 5.7* 5.0*  --   --   --   --   --   --   ALBUMIN 2.5* 2.3*   < > 2.2* 2.2* 2.4* 2.3* 2.3*   < > = values in this interval not displayed.   Recent Labs  Lab 12/23/20 1153  LIPASE 21   CBG: Recent Labs  Lab 12/28/20 1740 12/28/20 2206 12/29/20 0808 12/29/20 1123 12/29/20 1722  GLUCAP 241* 231* 215* 277* 231*   Anemia Panel: Recent Labs    12/28/20 0421 12/29/20 0414  FERRITIN 115 123   Sepsis Labs: Recent Labs  Lab 12/23/20 1510 12/23/20 1728  LATICACIDVEN 1.1 0.8    Recent Results (from the past 240 hour(s))  Resp Panel by RT-PCR (Flu A&B, Covid) Nasopharyngeal Swab     Status: Abnormal   Collection Time: 12/23/20 12:06 PM   Specimen: Nasopharyngeal Swab; Nasopharyngeal(NP) swabs in vial transport medium  Result Value Ref Range Status   SARS Coronavirus 2 by RT PCR POSITIVE (A) NEGATIVE Final    Comment: CRITICAL RESULT CALLED TO, READ BACK BY AND VERIFIED WITH: OAKLEY,B 1323 12/23/2020 COLEMAN,R (NOTE) SARS-CoV-2 target  nucleic acids are DETECTED.  The SARS-CoV-2 RNA is generally detectable in upper respiratory specimens during the acute phase of infection. Positive results are indicative of the presence of the identified virus, but do not rule out bacterial infection or co-infection with other pathogens not detected by the test. Clinical correlation with patient history and other diagnostic information is necessary to determine patient infection status. The expected result is Negative.  Fact Sheet for Patients: EntrepreneurPulse.com.au  Fact Sheet for Healthcare Providers: IncredibleEmployment.be  This test is not yet approved or cleared by the [place] FDA and  has been authorized for detection and/or diagnosis of SARS-CoV-2 by FDA under an Emergency Use Authorization (EUA).  This EUA will remain in effect (meaning this t est can be used) for the duration of  the COVID-19 declaration under Section 564(b)(1) of the Act, 21 U.S.C. section 360bbb-3(b)(1), unless the authorization is terminated or revoked sooner.     Influenza A by PCR NEGATIVE NEGATIVE Final   Influenza B by PCR NEGATIVE NEGATIVE Final    Comment: (NOTE) The Xpert Xpress SARS-CoV-2/FLU/RSV plus assay is intended as an aid in the diagnosis of influenza from Nasopharyngeal swab specimens and should not be used as a sole basis for treatment. Nasal washings and aspirates are unacceptable for Xpert Xpress SARS-CoV-2/FLU/RSV testing.  Fact Sheet for Patients: EntrepreneurPulse.com.au  Fact Sheet for Healthcare Providers: IncredibleEmployment.be  This test is not yet approved or cleared by the [place] FDA and has been authorized for detection and/or diagnosis of SARS-CoV-2 by FDA under an Emergency Use Authorization (EUA). This EUA will remain in effect (meaning this test can be used) for the duration of the COVID-19 declaration under Section  564(b)(1) of the Act, 21 U.S.C. section 360bbb-3(b)(1), unless the authorization is terminated or revoked.  Performed at [facility], [address] [place], [place] [address]   Urine culture     Status: Abnormal   Collection Time: 12/23/20  6:28 PM   Specimen: Urine, Clean Catch  Result Value Ref Range Status   Specimen Description   Final    URINE, CLEAN CATCH Performed at [facility], [address] [place], [place] [address]    Special Requests   Final    NONE Performed at [facility], [address] [place], [place] [address]    Culture (A)  Final    <  10,000 COLONIES/mL INSIGNIFICANT GROWTH Performed at [facility], [place] [address] [place], [place] [address]    Report Status 12/25/2020 FINAL  Final  MRSA PCR Screening     Status: None   Collection Time: 12/23/20  9:59 PM   Specimen: Nasal Mucosa; Nasopharyngeal  Result Value Ref Range Status   MRSA by PCR NEGATIVE NEGATIVE Final    Comment:        The GeneXpert MRSA Assay (FDA approved for NASAL specimens only), is one component of a comprehensive MRSA colonization surveillance program. It is not intended to diagnose MRSA infection nor to guide or monitor treatment for MRSA infections. Performed at [facility], [address] [place], [place] [address]      Radiology Studies: [facility] Chest [facility] 1 View  Result Date: 12/29/2020 CLINICAL DATA:  Status post dialysis catheter placement. EXAM: PORTABLE CHEST 1 VIEW COMPARISON:  Single-view of the chest 12/23/2020. FINDINGS: Left IJ approach dialysis catheter is in place with the tip projecting in the lower superior vena cava. No pneumothorax. Elevation of the right hemidiaphragm is unchanged. Lungs are clear. Heart size is upper normal. No acute or focal bony abnormality. IMPRESSION: Dialysis catheter tip projects in the lower superior vena cava. Negative for pneumothorax or acute disease. Electronically Signed   By: Inge Rise M.D.   On: 12/29/2020 11:42     Scheduled Meds: . calcium carbonate  800 mg of elemental calcium Oral TID WC  . Chlorhexidine Gluconate Cloth  6 each Topical Daily  . Chlorhexidine Gluconate Cloth  6 each Topical Q0600  . darbepoetin (ARANESP) injection - NON-DIALYSIS  100 mcg Subcutaneous Q Tue-1800  . insulin aspart  0-5 Units Subcutaneous QHS  . insulin aspart  0-9 Units Subcutaneous TID WC  . mouth rinse  15 mL Mouth Rinse BID  . sodium bicarbonate  1,300 mg Oral BID   Continuous Infusions: . insulin Stopped (12/24/20 2100)  . lactated ringers 50 mL/hr at 12/29/20 0957     LOS: 6 days    Time spent: 35 minutes    Barton Dubois, MD [facility]  If 7PM-7AM, please contact night-coverage [email or web address] 12/29/2020, 6:16 PM

## 2020-12-30 DIAGNOSIS — U071 COVID-19: Secondary | ICD-10-CM | POA: Diagnosis not present

## 2020-12-30 DIAGNOSIS — N179 Acute kidney failure, unspecified: Secondary | ICD-10-CM | POA: Diagnosis not present

## 2020-12-30 DIAGNOSIS — E872 Acidosis: Secondary | ICD-10-CM | POA: Diagnosis not present

## 2020-12-30 LAB — RENAL FUNCTION PANEL
Albumin: 2 g/dL — ABNORMAL LOW (ref 3.5–5.0)
Anion gap: 11 (ref 5–15)
BUN: 101 mg/dL — ABNORMAL HIGH (ref 8–23)
CO2: 21 mmol/L — ABNORMAL LOW (ref 22–32)
Calcium: 6.6 mg/dL — ABNORMAL LOW (ref 8.9–10.3)
Chloride: 107 mmol/L (ref 98–111)
Creatinine, Ser: 4.49 mg/dL — ABNORMAL HIGH (ref 0.61–1.24)
GFR, Estimated: 13 mL/min — ABNORMAL LOW (ref >60)
Glucose, Bld: 206 mg/dL — ABNORMAL HIGH (ref 70–99)
Phosphorus: 5.4 mg/dL — ABNORMAL HIGH (ref 2.5–4.6)
Potassium: 3.2 mmol/L — ABNORMAL LOW (ref 3.5–5.1)
Sodium: 139 mmol/L (ref 135–145)

## 2020-12-30 LAB — HEPATITIS B SURFACE ANTIGEN: Hepatitis B Surface Ag: NONREACTIVE

## 2020-12-30 LAB — GLUCOSE, CAPILLARY
Glucose-Capillary: 210 mg/dL — ABNORMAL HIGH (ref 70–99)
Glucose-Capillary: 207 mg/dL — ABNORMAL HIGH (ref 70–99)
Glucose-Capillary: 141 mg/dL — ABNORMAL HIGH (ref 70–99)
Glucose-Capillary: 223 mg/dL — ABNORMAL HIGH (ref 70–99)

## 2020-12-30 LAB — FERRITIN: Ferritin: 182 ng/mL (ref 24–336)

## 2020-12-30 LAB — C-REACTIVE PROTEIN: CRP: 10.5 mg/dL — ABNORMAL HIGH (ref <1.0)

## 2020-12-30 LAB — HEPATITIS B CORE ANTIBODY, TOTAL: Hep B Core Total Ab: NONREACTIVE

## 2020-12-30 MED ORDER — METOPROLOL TARTRATE 5 MG/5ML IV SOLN
5.0000 mg | Freq: Once | INTRAVENOUS | Status: AC
  Administered 2020-12-30: 5 mg via INTRAVENOUS
  Filled 2020-12-30: qty 5

## 2020-12-30 MED ORDER — METOPROLOL TARTRATE 50 MG PO TABS
50.0000 mg | ORAL_TABLET | Freq: Two times a day (BID) | ORAL | Status: DC
  Administered 2020-12-30 – 2021-01-24 (×52): 50 mg via ORAL
  Filled 2020-12-30 (×53): qty 1

## 2020-12-30 NOTE — Procedures (Signed)
   HEMODIALYSIS TREATMENT NOTE (HD#2):  2.5 hour heparin-free session completed.  NO UF / kept even as per order.  Remains on LR infusion.  Afib better controlled with resumption of Metoprolol.  Fewer bursts of RVR and non-sustained.  All blood was returned.  Rockwell Alexandria, RN

## 2020-12-30 NOTE — Progress Notes (Signed)
PROGRESS NOTE    [name]  MAU:[id number] DOB: [birth year] DOA: 12/23/2020 PCP: [name], MD   Brief Narrative:   Mark Dicksonis a 80 y.o.malewith medical history significant fortype 2 diabetes, CKD stage IIIb, hypertension, dyslipidemia, diastolic CHF, and CAD described to the ED via EMS with increasing weakness and confusion along with poor appetite for the last 2 to 3 days.Patient was admitted with AKI and severe acidosis along with hyperglycemia.  He continues to have persistent AKI with uremic encephalopathy.  He is also noted to be COVID-positive and has been started on remdesivir.  Assessment & Plan:   Active Problems:   AKI (acute kidney injury) ([place])   Generalized weakness with acute metabolic encephalopathy secondary to AKI with severe metabolic acidosis and uncompensated respiratory alkalosis -Suspect ongoing uremic encephalopathy -acidosis due to AKI; sodium bicarbonate 21 today after HD tx on 5/27 -Planning dialysis initiation later today. -mentation has remained stable; patient cooperative with examination.  Non-oliguricAKIon CKD3b-suspect prerenal with dehydration -Holding on further IV fluids currently; good urine output demonstrated. -Renal ultrasoundwithout hydronephrosis -continue monitoring strict I's and O's  -Continue avoiding nephrotoxic agents, hypotension and the use of contrast. -Appreciate ongoing nephrology evaluation; in the setting of worsening renal function. -General surgery placed left internal jugular dialysis catheter 12/29/2020; no pneumothorax and in good position.  First dialysis provided on 12/29/2020, overall well-tolerated.  Next dialysis today 5/63/8937.   -Metabolic acidosis significantly improved after first treatment; bicarb 21 today. -Creatinine and BUN are also trending down.  Hypocalcemia -resolved -Continue to follow electrolytes trend -Continue telemetry monitoring.  Hyperglycemiain setting  of DM2with mild DKA -resolved -Compliant with home medications according to wife at bedside -Likely stress-induced -Hemoglobin A1c7.0% demonstrating good control. -Continue sliding scale insulin and follow CBGs fluctuation.  COVID-19 infection -Chest x-ray with stable cardiomegaly and no other signs of infiltrate -Patient received 3 days of remdesivir for asymptomatic COVID-19 infection. -No need for steroids given no hypoxemia -Continue to trend CRP and ferritin -Continue isolation precautions -Patient is afebrile, in no distress and denies shortness of breath currently.  Thrombocytopenia -stable -Continue to monitor platelet count trend. -Continue avoiding heparin agents  Hypertension/CAD -Continue holding home medications for now until mentation further improves and his renal function further stabilize. -Continue to follow vital signs.  Hard of hearing -Patient uses hearing aids at baseline.  RVR -2D echo ordered -Beta-blocker has been resumed and dose adjusted -No palpitations reported; currently rate controlled and sinus rhythm appreciated.   DVT prophylaxis:SCDs Code Status:Full Family Communication:Wife updated over the phone 12/30/2020. Disposition Plan: Status is: Inpatient  Remains inpatient appropriate because:Altered mental status, IV treatments appropriate due to intensity of illness or inability to take PO and Inpatient level of care appropriate due to severity of illness   Dispo: The patient is from:Home Anticipated d/c is DS:[facility] Patient currently is not medically stable to d/c. Difficult to place patient No   Consultants:  Nephrology  General surgery  Procedures:  See below  Antimicrobials:  Anti-infectives (From admission, onward)   Start     Dose/Rate Route Frequency Ordered Stop   12/24/20 1000  remdesivir 100 mg in sodium chloride 0.9 % 100 mL IVPB        100 mg 200 mL/hr  over 30 Minutes Intravenous Daily 12/23/20 1637 12/27/20 1200   12/23/20 1645  remdesivir 100 mg in sodium chloride 0.9 % 100 mL IVPB        100 mg 200 mL/hr over 30 Minutes Intravenous Every  1 hr x 2 12/23/20 1636 12/23/20 1908      Subjective: No fever, no chest pain, no palpitation, no shortness of breath.  After dialysis initiation on 12/29/2020 patient experienced sustained A. fib with RVR, which has finally broke.  Objective: Vitals:   12/30/20 1645 12/30/20 1700 12/30/20 1715 12/30/20 1730  BP: 119/70 118/68 98/65 (!) 127/58  Pulse: 94 92 94 91  Resp: (!) 21 (!) 23 20 20   Temp:      TempSrc:      SpO2:      Weight:        Intake/Output Summary (Last 24 hours) at 12/30/2020 1739 Last data filed at 12/30/2020 1400 Gross per 24 hour  Intake 2932.08 ml  Output 650 ml  Net 2282.08 ml   Filed Weights   12/29/20 1915 12/30/20 0500 12/30/20 1500  Weight: 93.1 kg 94.6 kg 94.9 kg    Examination: General exam: Alert, awake and cooperative with examination. Tolerated HD treatment on 12/29/20, except for sustained episode of a. Fib with RVR.  Patient denies chest pain, palpitations and shortness of breath. Respiratory system: No wheezing, no crackles, no using accessory muscle.  Good oxygen saturation bilaterally appreciated. Cardiovascular system:Rate controlled, no rubs or gallops; telemetry telemetry evaluation demonstrating A. fib. Gastrointestinal system: Abdomen is nondistended, soft and nontender. No organomegaly or masses felt. Normal bowel sounds heard. Central nervous system: No focal neurological deficits. Extremities: No cyanosis or clubbing. Skin: No petechiae. Psychiatry: Appropriate mood. No agitation or hallucination.   Data Reviewed: I have personally reviewed following labs and imaging studies  CBC: Recent Labs  Lab 12/24/20 0440 12/25/20 0545 12/26/20 0508 12/27/20 0910 12/29/20 2014  WBC 10.4 9.4 9.4 10.4 16.4*  HGB 8.5* 8.7* 9.5* 9.6* 8.8*  HCT  25.3* 26.1* 29.9* 30.1* 26.2*  MCV 92.7 92.2 95.5 95.9 92.3  PLT 147* 141* 150 151 778   Basic Metabolic Panel: Recent Labs  Lab 12/24/20 0440 12/24/20 0853 12/25/20 0545 12/26/20 0508 12/27/20 0515 12/28/20 0421 12/29/20 0414 12/30/20 0448  NA 143   < > 144 144 144 142 142 139  K 3.4*   < > 3.3* 3.8 3.5 3.7 3.4* 3.2*  CL 113*   < > 112* 113* 112* 111 113* 107  CO2 13*   < > 16* 15* 17* 15* 14* 21*  GLUCOSE 135*   < > 85 115* 141* 232* 207* 206*  BUN 113*   < > 114* 110* 120* 128* 142* 101*  CREATININE 6.29*   < > 5.88* 5.89* 5.77* 5.89* 5.92* 4.49*  CALCIUM 5.5*   < > 5.9* 6.1* 6.5* 7.0* 6.8* 6.6*  MG 1.2*  --  1.5* 2.0 2.1  --   --   --   PHOS  --    < > 9.2* 9.5* 10.4* 10.4* 8.8* 5.4*   < > = values in this interval not displayed.   GFR: Estimated Creatinine Clearance: 15 mL/min (A) (by C-G formula based on SCr of 4.49 mg/dL (H)).   Liver Function Tests: Recent Labs  Lab 12/24/20 0440 12/24/20 1727 12/26/20 0508 12/27/20 0515 12/28/20 0421 12/29/20 0414 12/30/20 0448  AST 16  --   --   --   --   --   --   ALT 23  --   --   --   --   --   --   ALKPHOS 88  --   --   --   --   --   --  BILITOT 0.4  --   --   --   --   --   --   PROT 5.0*  --   --   --   --   --   --   ALBUMIN 2.3*   < > 2.2* 2.4* 2.3* 2.3* 2.0*   < > = values in this interval not displayed.   No results for input(s): LIPASE, AMYLASE in the last 168 hours. CBG: Recent Labs  Lab 12/29/20 1123 12/29/20 1722 12/29/20 2035 12/30/20 0832 12/30/20 1224  GLUCAP 277* 231* 183* 210* 207*   Anemia Panel: Recent Labs    12/29/20 0414 12/30/20 0448  FERRITIN 123 182   Sepsis Labs: No results for input(s): PROCALCITON, LATICACIDVEN in the last 168 hours.  Recent Results (from the past 240 hour(s))  Resp Panel by RT-PCR (Flu A&B, Covid) Nasopharyngeal Swab     Status: Abnormal   Collection Time: 12/23/20 12:06 PM   Specimen: Nasopharyngeal Swab; Nasopharyngeal(NP) swabs in vial transport  medium  Result Value Ref Range Status   SARS Coronavirus 2 by RT PCR POSITIVE (A) NEGATIVE Final    Comment: CRITICAL RESULT CALLED TO, READ BACK BY AND VERIFIED WITH: OAKLEY,B 1323 12/23/2020 COLEMAN,R (NOTE) SARS-CoV-2 target nucleic acids are DETECTED.  The SARS-CoV-2 RNA is generally detectable in upper respiratory specimens during the acute phase of infection. Positive results are indicative of the presence of the identified virus, but do not rule out bacterial infection or co-infection with other pathogens not detected by the test. Clinical correlation with patient history and other diagnostic information is necessary to determine patient infection status. The expected result is Negative.  Fact Sheet for Patients: EntrepreneurPulse.com.au  Fact Sheet for Healthcare Providers: IncredibleEmployment.be  This test is not yet approved or cleared by the [place] FDA and  has been authorized for detection and/or diagnosis of SARS-CoV-2 by FDA under an Emergency Use Authorization (EUA).  This EUA will remain in effect (meaning this t est can be used) for the duration of  the COVID-19 declaration under Section 564(b)(1) of the Act, 21 U.S.C. section 360bbb-3(b)(1), unless the authorization is terminated or revoked sooner.     Influenza A by PCR NEGATIVE NEGATIVE Final   Influenza B by PCR NEGATIVE NEGATIVE Final    Comment: (NOTE) The Xpert Xpress SARS-CoV-2/FLU/RSV plus assay is intended as an aid in the diagnosis of influenza from Nasopharyngeal swab specimens and should not be used as a sole basis for treatment. Nasal washings and aspirates are unacceptable for Xpert Xpress SARS-CoV-2/FLU/RSV testing.  Fact Sheet for Patients: EntrepreneurPulse.com.au  Fact Sheet for Healthcare Providers: IncredibleEmployment.be  This test is not yet approved or cleared by the [place] FDA and has been  authorized for detection and/or diagnosis of SARS-CoV-2 by FDA under an Emergency Use Authorization (EUA). This EUA will remain in effect (meaning this test can be used) for the duration of the COVID-19 declaration under Section 564(b)(1) of the Act, 21 U.S.C. section 360bbb-3(b)(1), unless the authorization is terminated or revoked.  Performed at [facility], [address], [place], [place] [address]   Urine culture     Status: Abnormal   Collection Time: 12/23/20  6:28 PM   Specimen: Urine, Clean Catch  Result Value Ref Range Status   Specimen Description   Final    URINE, CLEAN CATCH Performed at [facility], [address], [place], [place] [address]    Special Requests   Final    NONE Performed at [facility]  [address] [place], [place] [address]    Culture (A)  Final    <10,000 COLONIES/mL INSIGNIFICANT GROWTH Performed at [facility] [address], [place], [place] [address]    Report Status 12/25/2020 FINAL  Final  MRSA PCR Screening     Status: None   Collection Time: 12/23/20  9:59 PM   Specimen: Nasal Mucosa; Nasopharyngeal  Result Value Ref Range Status   MRSA by PCR NEGATIVE NEGATIVE Final    Comment:        The GeneXpert MRSA Assay (FDA approved for NASAL specimens only), is one component of a comprehensive MRSA colonization surveillance program. It is not intended to diagnose MRSA infection nor to guide or monitor treatment for MRSA infections. Performed at [facility], [address], [place], [place] [address]      Radiology Studies: [facility] Chest [facility] 1 View  Result Date: 12/29/2020 CLINICAL DATA:  Status post dialysis catheter placement. EXAM: PORTABLE CHEST 1 VIEW COMPARISON:  Single-view of the chest 12/23/2020. FINDINGS: Left IJ approach dialysis catheter is in place with the tip projecting in the lower superior vena cava. No pneumothorax. Elevation of the right hemidiaphragm is unchanged. Lungs are clear. Heart size is upper normal. No  acute or focal bony abnormality. IMPRESSION: Dialysis catheter tip projects in the lower superior vena cava. Negative for pneumothorax or acute disease. Electronically Signed   By: Inge Rise M.D.   On: 12/29/2020 11:42    Scheduled Meds: . calcium carbonate  800 mg of elemental calcium Oral TID WC  . Chlorhexidine Gluconate Cloth  6 each Topical Daily  . Chlorhexidine Gluconate Cloth  6 each Topical Q0600  . darbepoetin (ARANESP) injection - NON-DIALYSIS  100 mcg Subcutaneous Q Tue-1800  . insulin aspart  0-5 Units Subcutaneous QHS  . insulin aspart  0-9 Units Subcutaneous TID WC  . mouth rinse  15 mL Mouth Rinse BID  . metoprolol tartrate  50 mg Oral BID  . sodium bicarbonate  1,300 mg Oral BID   Continuous Infusions: . sodium chloride    . sodium chloride    . insulin Stopped (12/24/20 2100)  . lactated ringers 50 mL/hr at 12/30/20 0504     LOS: 7 days    Time spent: 35 minutes    Barton Dubois, MD [facility]  If 7PM-7AM, please contact night-coverage [email or web address] 12/30/2020, 5:39 PM

## 2020-12-31 ENCOUNTER — Inpatient Hospital Stay (HOSPITAL_COMMUNITY): Payer: PPO

## 2020-12-31 DIAGNOSIS — N179 Acute kidney failure, unspecified: Secondary | ICD-10-CM | POA: Diagnosis not present

## 2020-12-31 DIAGNOSIS — E872 Acidosis: Secondary | ICD-10-CM | POA: Diagnosis not present

## 2020-12-31 DIAGNOSIS — I4891 Unspecified atrial fibrillation: Secondary | ICD-10-CM

## 2020-12-31 DIAGNOSIS — U071 COVID-19: Secondary | ICD-10-CM | POA: Diagnosis not present

## 2020-12-31 LAB — RENAL FUNCTION PANEL
Albumin: 1.8 g/dL — ABNORMAL LOW (ref 3.5–5.0)
Anion gap: 11 (ref 5–15)
BUN: 73 mg/dL — ABNORMAL HIGH (ref 8–23)
CO2: 24 mmol/L (ref 22–32)
Calcium: 6.5 mg/dL — ABNORMAL LOW (ref 8.9–10.3)
Chloride: 102 mmol/L (ref 98–111)
Creatinine, Ser: 3.54 mg/dL — ABNORMAL HIGH (ref 0.61–1.24)
GFR, Estimated: 17 mL/min — ABNORMAL LOW (ref >60)
Glucose, Bld: 189 mg/dL — ABNORMAL HIGH (ref 70–99)
Phosphorus: 4.5 mg/dL (ref 2.5–4.6)
Potassium: 3.3 mmol/L — ABNORMAL LOW (ref 3.5–5.1)
Sodium: 137 mmol/L (ref 135–145)

## 2020-12-31 LAB — GLUCOSE, CAPILLARY
Glucose-Capillary: 184 mg/dL — ABNORMAL HIGH (ref 70–99)
Glucose-Capillary: 219 mg/dL — ABNORMAL HIGH (ref 70–99)
Glucose-Capillary: 191 mg/dL — ABNORMAL HIGH (ref 70–99)
Glucose-Capillary: 243 mg/dL — ABNORMAL HIGH (ref 70–99)

## 2020-12-31 LAB — C-REACTIVE PROTEIN: CRP: 12.4 mg/dL — ABNORMAL HIGH (ref <1.0)

## 2020-12-31 LAB — FERRITIN: Ferritin: 144 ng/mL (ref 24–336)

## 2020-12-31 LAB — ECHOCARDIOGRAM COMPLETE
Area-P 1/2: 4.08 cm2
S' Lateral: 3.3 cm
Weight: 3336.88 oz

## 2020-12-31 LAB — HEPATITIS B SURFACE ANTIBODY, QUANTITATIVE: Hep B S AB Quant (Post): <3.1 m[IU]/mL — ABNORMAL LOW (ref >9.9)

## 2020-12-31 NOTE — Progress Notes (Signed)
PROGRESS NOTE    [name]  HDQ:[id number] DOB: 24-Jan-1941 DOA: 12/23/2020 PCP: [name], MD   Brief Narrative:   Mark Jumbo Dicksonis a 80 y.o.malewith medical history significant fortype 2 diabetes, CKD stage IIIb, hypertension, dyslipidemia, diastolic CHF, and CAD described to the ED via EMS with increasing weakness and confusion along with poor appetite for the last 2 to 3 days.Patient was admitted with AKI and severe acidosis along with hyperglycemia.  He continues to have persistent AKI with uremic encephalopathy.  He is also noted to be COVID-positive and has been started on remdesivir.  Assessment & Plan:   Active Problems:   AKI (acute kidney injury) ([place])   Generalized weakness with acute metabolic encephalopathy secondary to AKI with severe metabolic acidosis and uncompensated respiratory alkalosis -Suspect ongoing uremic encephalopathy -acidosis due to AKI; sodium bicarbonate 24 today after HD tx on 5/28 -Planning dialysis initiation later today. -mentation has remained stable; patient cooperative with examination.  Non-oliguricAKIon CKD3b-suspect prerenal with dehydration -Holding on further IV fluids currently; good urine output demonstrated. -Renal ultrasoundwithout hydronephrosis -continue monitoring strict I's and O's  -Continue avoiding nephrotoxic agents, hypotension and the use of contrast. -General surgery placed left internal jugular dialysis catheter 12/29/2020; no pneumothorax and in good position.  First dialysis provided on 12/29/2020, overall well-tolerated.  Second dialysis treatment much better tolerated on 08/23/4172.  -Metabolic acidosis is now resolved; bicarb 24.  Creatinine level 3.59 and BUN 73. -Continue to follow recommendations by nephrology service; follow urine output and renal function trend.  Hypocalcemia -resolved -Continue to follow electrolytes trend -Continue telemetry monitoring.  Hyperglycemiain  setting of DM2with mild DKA -resolved -Compliant with home medications according to wife at bedside -Likely stress-induced -Hemoglobin A1c7.0% demonstrating good control. -Continue sliding scale insulin and follow CBGs fluctuation.  COVID-19 infection -Chest x-ray with stable cardiomegaly and no other signs of infiltrate -Patient received 3 days of remdesivir for asymptomatic COVID-19 infection. -No need for steroids given no hypoxemia -Continue to trend CRP and ferritin -Continue isolation precautions -Patient is afebrile, in no distress and denies shortness of breath currently.  Thrombocytopenia -stable -Continue to monitor platelet count trend. -Continue avoiding heparin agents  Hypertension/CAD -Continue holding home medications for now until mentation further improves and his renal function further stabilize. -Continue to follow vital signs. -No wall motion abnormality on 2D echo.  Hard of hearing -Patient uses hearing aids at baseline.  A. Fib with RVR -No sustained and associated with hemodialysis initiation -After resumption of beta-blocker and dose adjustment provided; patient has remained normal rate and sinus rhythm. -Denies palpitations, chest pain and shortness of breath.  2D echo ordered -2D echo with preserved ejection fraction, no wall motion normalities and grade 1 diastolic dysfunction.  No significant valvular disorder appreciated.  DVT prophylaxis:SCDs Code Status:Full Family Communication:Wife updated over the phone 12/30/2020. Disposition Plan: Status is: Inpatient  Remains inpatient appropriate because:Altered mental status, IV treatments appropriate due to intensity of illness or inability to take PO and Inpatient level of care appropriate due to severity of illness   Dispo: The patient is from:Home Anticipated d/c is YC:XKGY Patient currently is not medically stable to d/c. Difficult to place  patient No   Consultants:  Nephrology  General surgery  Procedures:  See below  2D echo 1. Left ventricular ejection fraction, by estimation, is 60 to 65%. The  left ventricle has normal function. The left ventricle has no regional  wall motion abnormalities. There is mild left ventricular hypertrophy.  Left  ventricular diastolic parameters  are consistent with Grade I diastolic dysfunction (impaired relaxation).  2. Right ventricular systolic function is normal. The right ventricular  size is normal. There is normal pulmonary artery systolic pressure.  3. The mitral valve is normal in structure. No evidence of mitral valve  regurgitation. No evidence of mitral stenosis.  4. The aortic valve is tricuspid. Aortic valve regurgitation is not  visualized. Mild aortic valve sclerosis is present, with no evidence of  aortic valve stenosis.  5. Aortic dilatation noted. There is mild dilatation of the aortic root,  measuring 39 mm.  6. The inferior vena cava is normal in size with greater than 50%  respiratory variability, suggesting right atrial pressure of 3 mmHg.   Antimicrobials:  Anti-infectives (From admission, onward)   Start     Dose/Rate Route Frequency Ordered Stop   12/24/20 1000  remdesivir 100 mg in sodium chloride 0.9 % 100 mL IVPB        100 mg 200 mL/hr over 30 Minutes Intravenous Daily 12/23/20 1637 12/27/20 1200   12/23/20 1645  remdesivir 100 mg in sodium chloride 0.9 % 100 mL IVPB        100 mg 200 mL/hr over 30 Minutes Intravenous Every 1 hr x 2 12/23/20 1636 12/23/20 1908      Subjective: No fever, no chest pain, no nausea, no vomiting.  Rate control on sinus at this time; patient with good tolerance to dialysis treatment on 12/30/2020.  Objective: Vitals:   12/31/20 1300 12/31/20 1400 12/31/20 1500 12/31/20 1600  BP: (!) 123/48 140/62 134/65 (!) 142/54  Pulse: 82 73 74 78  Resp: 20 (!) 21 20 (!) 25  Temp:    97.7 F (36.5 C)  TempSrc:     Oral  SpO2: 100% 98% 97% 100%  Weight:        Intake/Output Summary (Last 24 hours) at 12/31/2020 1653 Last data filed at 12/31/2020 0717 Gross per 24 hour  Intake 981.88 ml  Output 1277 ml  Net -295.12 ml   Filed Weights   12/30/20 0500 12/30/20 1500 12/31/20 0500  Weight: 94.6 kg 94.9 kg 94.6 kg    Examination: General exam: Alert, awake, oriented x 3 and cooperative with examination currently.  Denies chest pain, no nausea, no vomiting, no shortness of breath.  Patient is afebrile. Respiratory system: Good air movement bilaterally; no wheezing or crackles appreciated.  Good saturation on room air Cardiovascular system: Rate controlled and currently sinus rhythm appreciated on telemetry.  No JVD.  No rubs or gallops on exam. Gastrointestinal system: Abdomen is nondistended, soft and nontender. No organomegaly or masses felt. Normal bowel sounds heard. Central nervous system: Alert and oriented. No focal neurological deficits. Extremities: No cyanosis or clubbing. Skin: No petechiae. Psychiatry: Appropriate mood; no agitation, no hallucinations.    Data Reviewed: I have personally reviewed following labs and imaging studies  CBC: Recent Labs  Lab 12/25/20 0545 12/26/20 0508 12/27/20 0910 12/29/20 2014  WBC 9.4 9.4 10.4 16.4*  HGB 8.7* 9.5* 9.6* 8.8*  HCT 26.1* 29.9* 30.1* 26.2*  MCV 92.2 95.5 95.9 92.3  PLT 141* 150 151 940   Basic Metabolic Panel: Recent Labs  Lab 12/25/20 0545 12/26/20 0508 12/27/20 0515 12/28/20 0421 12/29/20 0414 12/30/20 0448 12/31/20 0543  NA 144 144 144 142 142 139 137  K 3.3* 3.8 3.5 3.7 3.4* 3.2* 3.3*  CL 112* 113* 112* 111 113* 107 102  CO2 16* 15* 17* 15* 14* 21* 24  GLUCOSE  85 115* 141* 232* 207* 206* 189*  BUN 114* 110* 120* 128* 142* 101* 73*  CREATININE 5.88* 5.89* 5.77* 5.89* 5.92* 4.49* 3.54*  CALCIUM 5.9* 6.1* 6.5* 7.0* 6.8* 6.6* 6.5*  MG 1.5* 2.0 2.1  --   --   --   --   PHOS 9.2* 9.5* 10.4* 10.4* 8.8* 5.4* 4.5    GFR: Estimated Creatinine Clearance: 19.1 mL/min (A) (by C-G formula based on SCr of 3.54 mg/dL (H)).   Liver Function Tests: Recent Labs  Lab 12/27/20 0515 12/28/20 0421 12/29/20 0414 12/30/20 0448 12/31/20 0543  ALBUMIN 2.4* 2.3* 2.3* 2.0* 1.8*   CBG: Recent Labs  Lab 12/30/20 1224 12/30/20 1816 12/30/20 2129 12/31/20 0836 12/31/20 1209  GLUCAP 207* 141* 223* 184* 219*   Anemia Panel: Recent Labs    12/30/20 0448 12/31/20 0542  FERRITIN 182 144   Sepsis Labs: No results for input(s): PROCALCITON, LATICACIDVEN in the last 168 hours.  Recent Results (from the past 240 hour(s))  Resp Panel by RT-PCR (Flu A&B, Covid) Nasopharyngeal Swab     Status: Abnormal   Collection Time: 12/23/20 12:06 PM   Specimen: Nasopharyngeal Swab; Nasopharyngeal(NP) swabs in vial transport medium  Result Value Ref Range Status   SARS Coronavirus 2 by RT PCR POSITIVE (A) NEGATIVE Final    Comment: CRITICAL RESULT CALLED TO, READ BACK BY AND VERIFIED WITH: OAKLEY,B 1323 12/23/2020 COLEMAN,R (NOTE) SARS-CoV-2 target nucleic acids are DETECTED.  The SARS-CoV-2 RNA is generally detectable in upper respiratory specimens during the acute phase of infection. Positive results are indicative of the presence of the identified virus, but do not rule out bacterial infection or co-infection with other pathogens not detected by the test. Clinical correlation with patient history and other diagnostic information is necessary to determine patient infection status. The expected result is Negative.  Fact Sheet for Patients: EntrepreneurPulse.com.au  Fact Sheet for Healthcare Providers: IncredibleEmployment.be  This test is not yet approved or cleared by the [place] FDA and  has been authorized for detection and/or diagnosis of SARS-CoV-2 by FDA under an Emergency Use Authorization (EUA).  This EUA will remain in effect (meaning this t est can be used)  for the duration of  the COVID-19 declaration under Section 564(b)(1) of the Act, 21 U.S.C. section 360bbb-3(b)(1), unless the authorization is terminated or revoked sooner.     Influenza A by PCR NEGATIVE NEGATIVE Final   Influenza B by PCR NEGATIVE NEGATIVE Final    Comment: (NOTE) The Xpert Xpress SARS-CoV-2/FLU/RSV plus assay is intended as an aid in the diagnosis of influenza from Nasopharyngeal swab specimens and should not be used as a sole basis for treatment. Nasal washings and aspirates are unacceptable for Xpert Xpress SARS-CoV-2/FLU/RSV testing.  Fact Sheet for Patients: EntrepreneurPulse.com.au  Fact Sheet for Healthcare Providers: IncredibleEmployment.be  This test is not yet approved or cleared by the [place] FDA and has been authorized for detection and/or diagnosis of SARS-CoV-2 by FDA under an Emergency Use Authorization (EUA). This EUA will remain in effect (meaning this test can be used) for the duration of the COVID-19 declaration under Section 564(b)(1) of the Act, 21 U.S.C. section 360bbb-3(b)(1), unless the authorization is terminated or revoked.  Performed at [facility], [address], [place], [place] [address]   Urine culture     Status: Abnormal   Collection Time: 12/23/20  6:28 PM   Specimen: Urine, Clean Catch  Result Value Ref Range Status   Specimen Description   Final  URINE, CLEAN CATCH Performed at [facility], [address] [place], [place] [address]    Special Requests   Final    NONE Performed at [facility], [address], [place], [place] [address]    Culture (A)  Final    <10,000 COLONIES/mL INSIGNIFICANT GROWTH Performed at [facility] [address] [place], [place] [address]    Report Status 12/25/2020 FINAL  Final  MRSA PCR Screening     Status: None   Collection Time: 12/23/20  9:59 PM   Specimen: Nasal Mucosa; Nasopharyngeal  Result Value Ref Range Status   MRSA  by PCR NEGATIVE NEGATIVE Final    Comment:        The GeneXpert MRSA Assay (FDA approved for NASAL specimens only), is one component of a comprehensive MRSA colonization surveillance program. It is not intended to diagnose MRSA infection nor to guide or monitor treatment for MRSA infections. Performed at [facility], [address] [place], [place] [address]      Radiology Studies: ECHOCARDIOGRAM COMPLETE  Result Date: 12/31/2020    ECHOCARDIOGRAM REPORT   Patient Name:   Mark HENDON Sr. Date of Exam: 12/31/2020 Medical Rec #:  [id number]            Height:       68.5 in Accession #:    [id number]           Weight:       208.6 lb Date of Birth:  15-May-1941            BSA:          2.092 m Patient Age:    3 years             BP:           146/63 mmHg Patient Gender: M                    HR:           84 bpm. Exam Location:  [facility] Procedure: 2D Echo, Cardiac Doppler and Color Doppler Indications:    Atrial Fibrillation I48.91  History:        Patient has prior history of Echocardiogram examinations, most                 recent 12/12/2011. Risk Factors:Hypertension, Diabetes and                 Dyslipidemia.  Sonographer:    Alvino Chapel RCS Referring Phys: [place]  1. Left ventricular ejection fraction, by estimation, is 60 to 65%. The left ventricle has normal function. The left ventricle has no regional wall motion abnormalities. There is mild left ventricular hypertrophy. Left ventricular diastolic parameters are consistent with Grade I diastolic dysfunction (impaired relaxation).  2. Right ventricular systolic function is normal. The right ventricular size is normal. There is normal pulmonary artery systolic pressure.  3. The mitral valve is normal in structure. No evidence of mitral valve regurgitation. No evidence of mitral stenosis.  4. The aortic valve is tricuspid. Aortic valve regurgitation is not visualized. Mild aortic valve sclerosis is present, with no  evidence of aortic valve stenosis.  5. Aortic dilatation noted. There is mild dilatation of the aortic root, measuring 39 mm.  6. The inferior vena cava is normal in size with greater than 50% respiratory variability, suggesting right atrial pressure of 3 mmHg. FINDINGS  Left Ventricle: Left ventricular ejection fraction, by  estimation, is 60 to 65%. The left ventricle has normal function. The left ventricle has no regional wall motion abnormalities. The left ventricular internal cavity size was normal in size. There is  mild left ventricular hypertrophy. Left ventricular diastolic parameters are consistent with Grade I diastolic dysfunction (impaired relaxation). Right Ventricle: The right ventricular size is normal. Right ventricular systolic function is normal. There is normal pulmonary artery systolic pressure. The tricuspid regurgitant velocity is 2.82 m/s, and with an assumed right atrial pressure of 3 mmHg,  the estimated right ventricular systolic pressure is 09.7 mmHg. Left Atrium: Left atrial size was normal in size. Right Atrium: Right atrial size was normal in size. Pericardium: There is no evidence of pericardial effusion. Mitral Valve: The mitral valve is normal in structure. Mild mitral annular calcification. No evidence of mitral valve regurgitation. No evidence of mitral valve stenosis. Tricuspid Valve: The tricuspid valve is normal in structure. Tricuspid valve regurgitation is mild . No evidence of tricuspid stenosis. Aortic Valve: The aortic valve is tricuspid. Aortic valve regurgitation is not visualized. Mild aortic valve sclerosis is present, with no evidence of aortic valve stenosis. Pulmonic Valve: The pulmonic valve was normal in structure. Pulmonic valve regurgitation is not visualized. No evidence of pulmonic stenosis. Aorta: Aortic dilatation noted. There is mild dilatation of the aortic root, measuring 39 mm. Venous: The inferior vena cava is normal in size with greater than 50%  respiratory variability, suggesting right atrial pressure of 3 mmHg.  LEFT VENTRICLE PLAX 2D LVIDd:         4.80 cm  Diastology LVIDs:         3.30 cm  LV e' medial:    5.66 cm/s LV PW:         1.10 cm  LV E/e' medial:  17.0 LV IVS:        1.50 cm  LV e' lateral:   8.81 cm/s LVOT diam:     2.10 cm  LV E/e' lateral: 10.9 LV SV:         85 LV SV Index:   40 LVOT Area:     3.46 cm  RIGHT VENTRICLE RV S prime:     12.00 cm/s TAPSE (M-mode): 2.1 cm LEFT ATRIUM             Index       RIGHT ATRIUM           Index LA diam:        3.00 cm 1.43 cm/m  RA Area:     16.10 cm LA Vol (A2C):   50.1 ml 23.95 ml/m RA Volume:   43.20 ml  20.65 ml/m LA Vol (A4C):   60.2 ml 28.78 ml/m LA Biplane Vol: 56.1 ml 26.82 ml/m  AORTIC VALVE LVOT Vmax:   110.00 cm/s LVOT Vmean:  79.300 cm/s LVOT VTI:    0.244 m  AORTA Ao Root diam: 3.90 cm MITRAL VALVE                TRICUSPID VALVE MV Area (PHT): 4.08 cm     TR Peak grad:   31.8 mmHg MV Decel Time: 186 msec     TR Vmax:        282.00 cm/s MV E velocity: 96.00 cm/s MV A velocity: 117.00 cm/s  SHUNTS MV E/A ratio:  0.82         Systemic VTI:  0.24 m  Systemic Diam: 2.10 cm Kirk Ruths MD Electronically signed by Kirk Ruths MD Signature Date/Time: 12/31/2020/10:23:47 AM    Final     Scheduled Meds: . calcium carbonate  800 mg of elemental calcium Oral TID WC  . Chlorhexidine Gluconate Cloth  6 each Topical Daily  . Chlorhexidine Gluconate Cloth  6 each Topical Q0600  . darbepoetin (ARANESP) injection - NON-DIALYSIS  100 mcg Subcutaneous Q Tue-1800  . insulin aspart  0-5 Units Subcutaneous QHS  . insulin aspart  0-9 Units Subcutaneous TID WC  . mouth rinse  15 mL Mouth Rinse BID  . metoprolol tartrate  50 mg Oral BID  . sodium bicarbonate  1,300 mg Oral BID   Continuous Infusions: . sodium chloride    . sodium chloride    . insulin Stopped (12/24/20 2100)  . lactated ringers 50 mL/hr at 12/31/20 0717     LOS: 8 days    Time spent: 35  minutes    Barton Dubois, MD [facility]  If 7PM-7AM, please contact night-coverage [email or web address] 12/31/2020, 4:53 PM

## 2020-12-31 NOTE — Progress Notes (Signed)
*  PRELIMINARY RESULTS* Echocardiogram 2D Echocardiogram has been performed.  [name] 12/31/2020, 10:16 AM

## 2021-01-01 DIAGNOSIS — N179 Acute kidney failure, unspecified: Secondary | ICD-10-CM | POA: Diagnosis not present

## 2021-01-01 DIAGNOSIS — U071 COVID-19: Secondary | ICD-10-CM | POA: Diagnosis not present

## 2021-01-01 DIAGNOSIS — E872 Acidosis: Secondary | ICD-10-CM | POA: Diagnosis not present

## 2021-01-01 LAB — RENAL FUNCTION PANEL
Albumin: 1.8 g/dL — ABNORMAL LOW (ref 3.5–5.0)
Anion gap: 9 (ref 5–15)
BUN: 82 mg/dL — ABNORMAL HIGH (ref 8–23)
CO2: 25 mmol/L (ref 22–32)
Calcium: 6.4 mg/dL — CL (ref 8.9–10.3)
Chloride: 103 mmol/L (ref 98–111)
Creatinine, Ser: 4 mg/dL — ABNORMAL HIGH (ref 0.61–1.24)
GFR, Estimated: 15 mL/min — ABNORMAL LOW (ref >60)
Glucose, Bld: 236 mg/dL — ABNORMAL HIGH (ref 70–99)
Phosphorus: 4.7 mg/dL — ABNORMAL HIGH (ref 2.5–4.6)
Potassium: 3.5 mmol/L (ref 3.5–5.1)
Sodium: 137 mmol/L (ref 135–145)

## 2021-01-01 LAB — GLUCOSE, CAPILLARY
Glucose-Capillary: 220 mg/dL — ABNORMAL HIGH (ref 70–99)
Glucose-Capillary: 306 mg/dL — ABNORMAL HIGH (ref 70–99)
Glucose-Capillary: 207 mg/dL — ABNORMAL HIGH (ref 70–99)
Glucose-Capillary: 191 mg/dL — ABNORMAL HIGH (ref 70–99)

## 2021-01-01 LAB — FERRITIN: Ferritin: 111 ng/mL (ref 24–336)

## 2021-01-01 LAB — C-REACTIVE PROTEIN: CRP: 12.6 mg/dL — ABNORMAL HIGH (ref <1.0)

## 2021-01-01 MED ORDER — CALCIUM GLUCONATE-NACL 1-0.675 GM/50ML-% IV SOLN
1.0000 g | Freq: Once | INTRAVENOUS | Status: AC
  Administered 2021-01-01: 1000 mg via INTRAVENOUS
  Filled 2021-01-01: qty 50

## 2021-01-01 MED ORDER — LACTATED RINGERS IV SOLN: INTRAVENOUS | Status: DC

## 2021-01-01 MED ORDER — VITAMIN D 25 MCG (1000 UNIT) PO TABS
2000.0000 [IU] | ORAL_TABLET | Freq: Every day | ORAL | Status: DC
  Administered 2021-01-01 – 2021-01-25 (×24): 2000 [IU] via ORAL
  Filled 2021-01-01 (×24): qty 2

## 2021-01-01 NOTE — Progress Notes (Signed)
Bipap is PRN order.  It is not needed at this time.  Will continue to monitor patient.

## 2021-01-01 NOTE — Progress Notes (Addendum)
[facility] NEPHROLOGY PROGRESS NOTE  Assessment/ Plan: Pt is a 80 y.o. yo male with history of DM, hypertension, HLD, diastolic CHF, CAD, CKD stage IIIb who was admitted with generalized weakness, altered mental status and poor appetite seen for AKI on CKD.  #Acute kidney injury on CKD 3b, nonoliguric presumably due to ischemic ATN in the setting of volume depletion/hypotension and acute illness due to COVID-19 pneumonia.  He has received 2 dialysis session because of azotemia/uremia with improvement of mental status.  He is non-oliguric however creatinine level slightly went up today.  Plan to hold off dialysis and watch out for renal recovery.  We will do strict ins and outs, daily lab monitoring.  He may need another session of dialysis tomorrow if no improvement in renal function.  I have discussed this with the patient and his wife today.  Increase IV fluid rate 100 cc an hour as he still looks dry on exam and has decreased oral intake.  #Acute metabolic encephalopathy: Multifactorial etiology including COVID-pneumonia, azotemia/uremia.  The mental status seems to be improving after dialysis.  Continue to watch.  Still looks very weak and deconditioned.  He will benefit from PT OT therapy.  # Anemia of acute illness: Monitor hemoglobin.  Received ESA.  Transfuse as needed.  He is COVID-positive.  #Secondary hyperparathyroidism.hypocalcemia: Vitamin D level 25.2, PTH 298.  Continue calcium carbonate and is starting oral vitamin D.  # HTN/volume: Blood pressure acceptable.  Currently on metoprolol.  Continue monitor.  Discussed with the primary team.  Subjective: Seen and examined at bedside.  He was alert awake and participated in conversation.  His wife at bedside.  He is making clear urine, only documented 600 cc.  Denies nausea vomiting chest pain shortness of breath.    Objective Vital signs in last 24 hours: Vitals:   12/31/20 2135 01/01/21 0110 01/01/21 0500 01/01/21  0543  BP: (!) 149/67 131/60  128/72  Pulse: 82 77  (!) 110  Resp: 19 18  19   Temp: 98.3 F (36.8 C) 98.2 F (36.8 C)  98.2 F (36.8 C)  TempSrc:  Oral    SpO2: 99% 100%  98%  Weight:   94.7 kg    Weight change: -0.2 kg  Intake/Output Summary (Last 24 hours) at 01/01/2021 1147 Last data filed at 01/01/2021 0900 Gross per 24 hour  Intake 240 ml  Output 600 ml  Net -360 ml       Labs: Basic Metabolic Panel: Recent Labs  Lab 12/30/20 0448 12/31/20 0543 01/01/21 0638  NA 139 137 137  K 3.2* 3.3* 3.5  CL 107 102 103  CO2 21* 24 25  GLUCOSE 206* 189* 236*  BUN 101* 73* 82*  CREATININE 4.49* 3.54* 4.00*  CALCIUM 6.6* 6.5* 6.4*  PHOS 5.4* 4.5 4.7*   Liver Function Tests: Recent Labs  Lab 12/30/20 0448 12/31/20 0543 01/01/21 1594  ALBUMIN 2.0* 1.8* 1.8*   No results for input(s): LIPASE, AMYLASE in the last 168 hours. No results for input(s): AMMONIA in the last 168 hours. CBC: Recent Labs  Lab 12/26/20 0508 12/27/20 0910 12/29/20 2014  WBC 9.4 10.4 16.4*  HGB 9.5* 9.6* 8.8*  HCT 29.9* 30.1* 26.2*  MCV 95.5 95.9 92.3  PLT 150 151 186   Cardiac Enzymes: No results for input(s): CKTOTAL, CKMB, CKMBINDEX, TROPONINI in the last 168 hours. CBG: Recent Labs  Lab 12/31/20 0836 12/31/20 1209 12/31/20 1749 12/31/20 2134 01/01/21 0712  GLUCAP 184* 219* 191* 243* 220*  Iron Studies:  Recent Labs    01/01/21 3785  FERRITIN 111   Studies/Results: ECHOCARDIOGRAM COMPLETE  Result Date: 12/31/2020    ECHOCARDIOGRAM REPORT   Patient Name:   [name]. Date of Exam: 12/31/2020 Medical Rec #:  [id number]            Height:       68.5 in Accession #:    [id number]           Weight:       208.6 lb Date of Birth:  1941-02-05            BSA:          2.092 m Patient Age:    43 years             BP:           146/63 mmHg Patient Gender: M                    HR:           84 bpm. Exam Location:  [facility] Procedure: 2D Echo, Cardiac Doppler and Color  Doppler Indications:    Atrial Fibrillation I48.91  History:        Patient has prior history of Echocardiogram examinations, most                 recent 12/12/2011. Risk Factors:Hypertension, Diabetes and                 Dyslipidemia.  Sonographer:    Alvino Chapel RCS Referring Phys: [place]  1. Left ventricular ejection fraction, by estimation, is 60 to 65%. The left ventricle has normal function. The left ventricle has no regional wall motion abnormalities. There is mild left ventricular hypertrophy. Left ventricular diastolic parameters are consistent with Grade I diastolic dysfunction (impaired relaxation).  2. Right ventricular systolic function is normal. The right ventricular size is normal. There is normal pulmonary artery systolic pressure.  3. The mitral valve is normal in structure. No evidence of mitral valve regurgitation. No evidence of mitral stenosis.  4. The aortic valve is tricuspid. Aortic valve regurgitation is not visualized. Mild aortic valve sclerosis is present, with no evidence of aortic valve stenosis.  5. Aortic dilatation noted. There is mild dilatation of the aortic root, measuring 39 mm.  6. The inferior vena cava is normal in size with greater than 50% respiratory variability, suggesting right atrial pressure of 3 mmHg. FINDINGS  Left Ventricle: Left ventricular ejection fraction, by estimation, is 60 to 65%. The left ventricle has normal function. The left ventricle has no regional wall motion abnormalities. The left ventricular internal cavity size was normal in size. There is  mild left ventricular hypertrophy. Left ventricular diastolic parameters are consistent with Grade I diastolic dysfunction (impaired relaxation). Right Ventricle: The right ventricular size is normal. Right ventricular systolic function is normal. There is normal pulmonary artery systolic pressure. The tricuspid regurgitant velocity is 2.82 m/s, and with an assumed right atrial pressure  of 3 mmHg,  the estimated right ventricular systolic pressure is 94.7 mmHg. Left Atrium: Left atrial size was normal in size. Right Atrium: Right atrial size was normal in size. Pericardium: There is no evidence of pericardial effusion. Mitral Valve: The mitral valve is normal in structure. Mild mitral annular calcification. No evidence of mitral valve regurgitation. No evidence of mitral valve stenosis. Tricuspid Valve: The tricuspid valve is normal in structure. Tricuspid valve regurgitation  is mild . No evidence of tricuspid stenosis. Aortic Valve: The aortic valve is tricuspid. Aortic valve regurgitation is not visualized. Mild aortic valve sclerosis is present, with no evidence of aortic valve stenosis. Pulmonic Valve: The pulmonic valve was normal in structure. Pulmonic valve regurgitation is not visualized. No evidence of pulmonic stenosis. Aorta: Aortic dilatation noted. There is mild dilatation of the aortic root, measuring 39 mm. Venous: The inferior vena cava is normal in size with greater than 50% respiratory variability, suggesting right atrial pressure of 3 mmHg.  LEFT VENTRICLE PLAX 2D LVIDd:         4.80 cm  Diastology LVIDs:         3.30 cm  LV e' medial:    5.66 cm/s LV PW:         1.10 cm  LV E/e' medial:  17.0 LV IVS:        1.50 cm  LV e' lateral:   8.81 cm/s LVOT diam:     2.10 cm  LV E/e' lateral: 10.9 LV SV:         85 LV SV Index:   40 LVOT Area:     3.46 cm  RIGHT VENTRICLE RV S prime:     12.00 cm/s TAPSE (M-mode): 2.1 cm LEFT ATRIUM             Index       RIGHT ATRIUM           Index LA diam:        3.00 cm 1.43 cm/m  RA Area:     16.10 cm LA Vol (A2C):   50.1 ml 23.95 ml/m RA Volume:   43.20 ml  20.65 ml/m LA Vol (A4C):   60.2 ml 28.78 ml/m LA Biplane Vol: 56.1 ml 26.82 ml/m  AORTIC VALVE LVOT Vmax:   110.00 cm/s LVOT Vmean:  79.300 cm/s LVOT VTI:    0.244 m  AORTA Ao Root diam: 3.90 cm MITRAL VALVE                TRICUSPID VALVE MV Area (PHT): 4.08 cm     TR Peak grad:   31.8  mmHg MV Decel Time: 186 msec     TR Vmax:        282.00 cm/s MV E velocity: 96.00 cm/s MV A velocity: 117.00 cm/s  SHUNTS MV E/A ratio:  0.82         Systemic VTI:  0.24 m                             Systemic Diam: 2.10 cm Kirk Ruths MD Electronically signed by Kirk Ruths MD Signature Date/Time: 12/31/2020/10:23:47 AM    Final     Medications: Infusions: . sodium chloride    . sodium chloride    . insulin Stopped (12/24/20 2100)  . lactated ringers 50 mL/hr at 12/31/20 0717    Scheduled Medications: . calcium carbonate  800 mg of elemental calcium Oral TID WC  . Chlorhexidine Gluconate Cloth  6 each Topical Daily  . Chlorhexidine Gluconate Cloth  6 each Topical Q0600  . darbepoetin (ARANESP) injection - NON-DIALYSIS  100 mcg Subcutaneous Q Tue-1800  . insulin aspart  0-5 Units Subcutaneous QHS  . insulin aspart  0-9 Units Subcutaneous TID WC  . mouth rinse  15 mL Mouth Rinse BID  . metoprolol tartrate  50 mg Oral BID  . sodium bicarbonate  1,300 mg  Oral BID    have reviewed scheduled and prn medications.  Physical Exam: General:NAD, comfortable, alert awake Heart:RRR, s1s2 nl Lungs:clear b/l, no crackle Abdomen:soft, Non-tender, non-distended Extremities:No edema Dialysis Access: Temporary HD catheter  Bertha Earwood Prasad Fredie Majano 01/01/2021,11:47 AM  LOS: 9 days

## 2021-01-01 NOTE — Progress Notes (Signed)
PROGRESS NOTE    [name]  BJS:[id number] DOB: 03/23/1941 DOA: 12/23/2020 PCP: Mark Frizzle, MD   Brief Narrative:   Mark Dicksonis a 80 y.o.malewith medical history significant fortype 2 diabetes, CKD stage IIIb, hypertension, dyslipidemia, diastolic CHF, and CAD described to the ED via EMS with increasing weakness and confusion along with poor appetite for the last 2 to 3 days.Patient was admitted with AKI and severe acidosis along with hyperglycemia.  He continues to have persistent AKI with uremic encephalopathy.  He is also noted to be COVID-positive and has been started on remdesivir.  Assessment & Plan:   Active Problems:   AKI (acute kidney injury) ([place])   Generalized weakness with acute metabolic encephalopathy secondary to AKI with severe metabolic acidosis and uncompensated respiratory alkalosis -Suspect ongoing uremic encephalopathy -acidosis due to AKI; sodium bicarbonate 25 today after second HD tx on 5/28 -mentation has remained stable; patient cooperative with examination and oriented x3..  Non-oliguricAKIon CKD3b-suspect ischemic ATN in the setting of prerenal with dehydration, volume depletion, hypotension and acute COVID infection. -Renal ultrasoundwithout hydronephrosis -continue monitoring strict I's and O's  -Continue avoiding nephrotoxic agents, hypotension and the use of contrast. -General surgery placed left internal jugular dialysis catheter 12/29/2020; no pneumothorax and in good position.  First dialysis provided on 12/29/2020, overall well-tolerated.  Second dialysis treatment much better tolerated on 01/09/3709.  -Metabolic acidosis is now resolved; bicarb 25.  Creatinine level up to 4.0 and BUN 82 after 24 hours without dialysis treatment.   -Continue monitoring urine output and further renal function trend  -Follow recommendations by nephrology service regarding next dialysis treatment   Hypocalcemia -Continue to follow  electrolytes trend -Continue telemetry monitoring. -calcium gluconate IV X 1 and Tums initiated  Hyperglycemiain setting of DM2with mild DKA -resolved -Compliant with home medications according to wife at bedside -Likely stress-induced -Hemoglobin A1c7.0% demonstrating good control. -Continue sliding scale insulin and follow CBGs fluctuation.  COVID-19 infection -Chest x-ray with stable cardiomegaly and no other signs of infiltrate -Patient received 3 days of remdesivir for asymptomatic COVID-19 infection. -No need for steroids given no hypoxemia -Continue to trend CRP and ferritin -Continue isolation precautions -Patient is afebrile, in no distress and denies shortness of breath currently.  Thrombocytopenia -stable -Continue to monitor platelet count trend. -Continue avoiding heparin agents  Hypertension/CAD -Continue holding home medications for now until mentation further improves and his renal function further stabilize. -Continue to follow vital signs. -No wall motion abnormality on 2D echo.  Very Hard of hearing -Patient uses hearing aids at baseline.  A. Fib with RVR -No sustained and associated with hemodialysis initiation -After resumption of beta-blocker and dose adjustment provided; patient has remained normal rate and sinus rhythm. -Denies palpitations, chest pain and shortness of breath. -2D echo with preserved ejection fraction, no wall motion normalities and grade 1 diastolic dysfunction.  No significant valvular disorder appreciated.  DVT prophylaxis:SCDs Code Status:Full Family Communication:Wife updated at bedside 01/01/2021 Disposition Plan: Status is: Inpatient  Remains inpatient appropriate because:Altered mental status, IV treatments appropriate due to intensity of illness or inability to take PO and Inpatient level of care appropriate due to severity of illness   Dispo: The patient is from:Home Anticipated d/c is  GY:IRSW Patient currently is not medically stable to d/c. Difficult to place patient No   Consultants:  Nephrology  General surgery  Procedures:  See below  2D echo 1. Left ventricular ejection fraction, by estimation, is 60 to 65%. The  left ventricle  has normal function. The left ventricle has no regional  wall motion abnormalities. There is mild left ventricular hypertrophy.  Left ventricular diastolic parameters  are consistent with Grade I diastolic dysfunction (impaired relaxation).  2. Right ventricular systolic function is normal. The right ventricular  size is normal. There is normal pulmonary artery systolic pressure.  3. The mitral valve is normal in structure. No evidence of mitral valve  regurgitation. No evidence of mitral stenosis.  4. The aortic valve is tricuspid. Aortic valve regurgitation is not  visualized. Mild aortic valve sclerosis is present, with no evidence of  aortic valve stenosis.  5. Aortic dilatation noted. There is mild dilatation of the aortic root,  measuring 39 mm.  6. The inferior vena cava is normal in size with greater than 50%  respiratory variability, suggesting right atrial pressure of 3 mmHg.   Antimicrobials:  Anti-infectives (From admission, onward)   Start     Dose/Rate Route Frequency Ordered Stop   12/24/20 1000  remdesivir 100 mg in sodium chloride 0.9 % 100 mL IVPB        100 mg 200 mL/hr over 30 Minutes Intravenous Daily 12/23/20 1637 12/27/20 1200   12/23/20 1645  remdesivir 100 mg in sodium chloride 0.9 % 100 mL IVPB        100 mg 200 mL/hr over 30 Minutes Intravenous Every 1 hr x 2 12/23/20 1636 12/23/20 1908      Subjective: No fever, no chest pain, no nausea, no vomiting, no palpitations, no overnight events reported.  Continue to maintain good orientation and is following commands appropriately.  Patient expressed feeling weak.  Objective: Vitals:   01/01/21 0110 01/01/21  0500 01/01/21 0543 01/01/21 1406  BP: 131/60  128/72 121/60  Pulse: 77  (!) 110 86  Resp: 18  19 17   Temp: 98.2 F (36.8 C)  98.2 F (36.8 C) 99.2 F (37.3 C)  TempSrc: Oral     SpO2: 100%  98% 100%  Weight:  94.7 kg      Intake/Output Summary (Last 24 hours) at 01/01/2021 1544 Last data filed at 01/01/2021 1130 Gross per 24 hour  Intake 240 ml  Output 750 ml  Net -510 ml   Filed Weights   12/30/20 1500 12/31/20 0500 01/01/21 0500  Weight: 94.9 kg 94.6 kg 94.7 kg    Examination: General exam: Alert, awake, oriented x 3; hard of hearing but cooperative with examination and in no acute distress.  Reports feeling weak.  No nausea or vomiting.  Patient is afebrile Respiratory system: Clear to auscultation. Respiratory effort normal.  Good oxygen saturation on room air. Cardiovascular system:RRR.  No rubs, no gallops, no JVD. Gastrointestinal system: Abdomen is nondistended, soft and nontender. No organomegaly or masses felt. Normal bowel sounds heard. Central nervous system:No focal neurological deficits. Extremities: No cyanosis or clubbing. Skin: No petechiae. Psychiatry: Mood & affect appropriate.  No agitation or hallucinations.   Data Reviewed: I have personally reviewed following labs and imaging studies  CBC: Recent Labs  Lab 12/26/20 0508 12/27/20 0910 12/29/20 2014  WBC 9.4 10.4 16.4*  HGB 9.5* 9.6* 8.8*  HCT 29.9* 30.1* 26.2*  MCV 95.5 95.9 92.3  PLT 150 151 128   Basic Metabolic Panel: Recent Labs  Lab 12/26/20 0508 12/27/20 0515 12/28/20 0421 12/29/20 0414 12/30/20 0448 12/31/20 0543 01/01/21 0638  NA 144 144 142 142 139 137 137  K 3.8 3.5 3.7 3.4* 3.2* 3.3* 3.5  CL 113* 112* 111 113* 107 102 103  CO2 15* 17* 15* 14* 21* 24 25  GLUCOSE 115* 141* 232* 207* 206* 189* 236*  BUN 110* 120* 128* 142* 101* 73* 82*  CREATININE 5.89* 5.77* 5.89* 5.92* 4.49* 3.54* 4.00*  CALCIUM 6.1* 6.5* 7.0* 6.8* 6.6* 6.5* 6.4*  MG 2.0 2.1  --   --   --   --   --    PHOS 9.5* 10.4* 10.4* 8.8* 5.4* 4.5 4.7*   GFR: Estimated Creatinine Clearance: 16.9 mL/min (A) (by C-G formula based on SCr of 4 mg/dL (H)).   Liver Function Tests: Recent Labs  Lab 12/28/20 0421 12/29/20 0414 12/30/20 0448 12/31/20 0543 01/01/21 8676  ALBUMIN 2.3* 2.3* 2.0* 1.8* 1.8*   CBG: Recent Labs  Lab 12/31/20 1209 12/31/20 1749 12/31/20 2134 01/01/21 0712 01/01/21 1134  GLUCAP 219* 191* 243* 220* 306*   Anemia Panel: Recent Labs    12/31/20 0542 01/01/21 0638  FERRITIN 144 111   Sepsis Labs: No results for input(s): PROCALCITON, LATICACIDVEN in the last 168 hours.  Recent Results (from the past 240 hour(s))  Resp Panel by RT-PCR (Flu A&B, Covid) Nasopharyngeal Swab     Status: Abnormal   Collection Time: 12/23/20 12:06 PM   Specimen: Nasopharyngeal Swab; Nasopharyngeal(NP) swabs in vial transport medium  Result Value Ref Range Status   SARS Coronavirus 2 by RT PCR POSITIVE (A) NEGATIVE Final    Comment: CRITICAL RESULT CALLED TO, READ BACK BY AND VERIFIED WITH: OAKLEY,B 1323 12/23/2020 COLEMAN,R (NOTE) SARS-CoV-2 target nucleic acids are DETECTED.  The SARS-CoV-2 RNA is generally detectable in upper respiratory specimens during the acute phase of infection. Positive results are indicative of the presence of the identified virus, but do not rule out bacterial infection or co-infection with other pathogens not detected by the test. Clinical correlation with patient history and other diagnostic information is necessary to determine patient infection status. The expected result is Negative.  Fact Sheet for Patients: EntrepreneurPulse.com.au  Fact Sheet for Healthcare Providers: IncredibleEmployment.be  This test is not yet approved or cleared by the [place] FDA and  has been authorized for detection and/or diagnosis of SARS-CoV-2 by FDA under an Emergency Use Authorization (EUA).  This EUA will remain in  effect (meaning this t est can be used) for the duration of  the COVID-19 declaration under Section 564(b)(1) of the Act, 21 U.S.C. section 360bbb-3(b)(1), unless the authorization is terminated or revoked sooner.     Influenza A by PCR NEGATIVE NEGATIVE Final   Influenza B by PCR NEGATIVE NEGATIVE Final    Comment: (NOTE) The Xpert Xpress SARS-CoV-2/FLU/RSV plus assay is intended as an aid in the diagnosis of influenza from Nasopharyngeal swab specimens and should not be used as a sole basis for treatment. Nasal washings and aspirates are unacceptable for Xpert Xpress SARS-CoV-2/FLU/RSV testing.  Fact Sheet for Patients: EntrepreneurPulse.com.au  Fact Sheet for Healthcare Providers: IncredibleEmployment.be  This test is not yet approved or cleared by the [place] FDA and has been authorized for detection and/or diagnosis of SARS-CoV-2 by FDA under an Emergency Use Authorization (EUA). This EUA will remain in effect (meaning this test can be used) for the duration of the COVID-19 declaration under Section 564(b)(1) of the Act, 21 U.S.C. section 360bbb-3(b)(1), unless the authorization is terminated or revoked.  Performed at [facility], [address], [place], [place] [address]   Urine culture     Status: Abnormal   Collection Time: 12/23/20  6:28 PM   Specimen: Urine, Clean Catch  Result Value Ref  Range Status   Specimen Description   Final    URINE, CLEAN CATCH Performed at [facility], [address], [place], [place] [address]    Special Requests   Final    NONE Performed at [facility], [address], [place], [place] [address]    Culture (A)  Final    <10,000 COLONIES/mL INSIGNIFICANT GROWTH Performed at [facility] [address], [place], [place] [address]    Report Status 12/25/2020 FINAL  Final  MRSA PCR Screening     Status: None   Collection Time: 12/23/20  9:59 PM   Specimen: Nasal Mucosa; Nasopharyngeal   Result Value Ref Range Status   MRSA by PCR NEGATIVE NEGATIVE Final    Comment:        The GeneXpert MRSA Assay (FDA approved for NASAL specimens only), is one component of a comprehensive MRSA colonization surveillance program. It is not intended to diagnose MRSA infection nor to guide or monitor treatment for MRSA infections. Performed at [facility], [address] [place], [place] [address]      Radiology Studies: ECHOCARDIOGRAM COMPLETE  Result Date: 12/31/2020    ECHOCARDIOGRAM REPORT   Patient Name:   SAVOY SOMERVILLE Sr. Date of Exam: 12/31/2020 Medical Rec #:  [id number]            Height:       68.5 in Accession #:    [id number]           Weight:       208.6 lb Date of Birth:  March 03, 1941            BSA:          2.092 m Patient Age:    75 years             BP:           146/63 mmHg Patient Gender: M                    HR:           84 bpm. Exam Location:  [facility] Procedure: 2D Echo, Cardiac Doppler and Color Doppler Indications:    Atrial Fibrillation I48.91  History:        Patient has prior history of Echocardiogram examinations, most                 recent 12/12/2011. Risk Factors:Hypertension, Diabetes and                 Dyslipidemia.  Sonographer:    Alvino Chapel RCS Referring Phys: [place]  1. Left ventricular ejection fraction, by estimation, is 60 to 65%. The left ventricle has normal function. The left ventricle has no regional wall motion abnormalities. There is mild left ventricular hypertrophy. Left ventricular diastolic parameters are consistent with Grade I diastolic dysfunction (impaired relaxation).  2. Right ventricular systolic function is normal. The right ventricular size is normal. There is normal pulmonary artery systolic pressure.  3. The mitral valve is normal in structure. No evidence of mitral valve regurgitation. No evidence of mitral stenosis.  4. The aortic valve is tricuspid. Aortic valve regurgitation is not visualized. Mild  aortic valve sclerosis is present, with no evidence of aortic valve stenosis.  5. Aortic dilatation noted. There is mild dilatation of the aortic root, measuring 39 mm.  6. The inferior vena cava is normal in size with greater than 50% respiratory variability, suggesting right atrial pressure  of 3 mmHg. FINDINGS  Left Ventricle: Left ventricular ejection fraction, by estimation, is 60 to 65%. The left ventricle has normal function. The left ventricle has no regional wall motion abnormalities. The left ventricular internal cavity size was normal in size. There is  mild left ventricular hypertrophy. Left ventricular diastolic parameters are consistent with Grade I diastolic dysfunction (impaired relaxation). Right Ventricle: The right ventricular size is normal. Right ventricular systolic function is normal. There is normal pulmonary artery systolic pressure. The tricuspid regurgitant velocity is 2.82 m/s, and with an assumed right atrial pressure of 3 mmHg,  the estimated right ventricular systolic pressure is 73.7 mmHg. Left Atrium: Left atrial size was normal in size. Right Atrium: Right atrial size was normal in size. Pericardium: There is no evidence of pericardial effusion. Mitral Valve: The mitral valve is normal in structure. Mild mitral annular calcification. No evidence of mitral valve regurgitation. No evidence of mitral valve stenosis. Tricuspid Valve: The tricuspid valve is normal in structure. Tricuspid valve regurgitation is mild . No evidence of tricuspid stenosis. Aortic Valve: The aortic valve is tricuspid. Aortic valve regurgitation is not visualized. Mild aortic valve sclerosis is present, with no evidence of aortic valve stenosis. Pulmonic Valve: The pulmonic valve was normal in structure. Pulmonic valve regurgitation is not visualized. No evidence of pulmonic stenosis. Aorta: Aortic dilatation noted. There is mild dilatation of the aortic root, measuring 39 mm. Venous: The inferior vena cava is  normal in size with greater than 50% respiratory variability, suggesting right atrial pressure of 3 mmHg.  LEFT VENTRICLE PLAX 2D LVIDd:         4.80 cm  Diastology LVIDs:         3.30 cm  LV e' medial:    5.66 cm/s LV PW:         1.10 cm  LV E/e' medial:  17.0 LV IVS:        1.50 cm  LV e' lateral:   8.81 cm/s LVOT diam:     2.10 cm  LV E/e' lateral: 10.9 LV SV:         85 LV SV Index:   40 LVOT Area:     3.46 cm  RIGHT VENTRICLE RV S prime:     12.00 cm/s TAPSE (M-mode): 2.1 cm LEFT ATRIUM             Index       RIGHT ATRIUM           Index LA diam:        3.00 cm 1.43 cm/m  RA Area:     16.10 cm LA Vol (A2C):   50.1 ml 23.95 ml/m RA Volume:   43.20 ml  20.65 ml/m LA Vol (A4C):   60.2 ml 28.78 ml/m LA Biplane Vol: 56.1 ml 26.82 ml/m  AORTIC VALVE LVOT Vmax:   110.00 cm/s LVOT Vmean:  79.300 cm/s LVOT VTI:    0.244 m  AORTA Ao Root diam: 3.90 cm MITRAL VALVE                TRICUSPID VALVE MV Area (PHT): 4.08 cm     TR Peak grad:   31.8 mmHg MV Decel Time: 186 msec     TR Vmax:        282.00 cm/s MV E velocity: 96.00 cm/s MV A velocity: 117.00 cm/s  SHUNTS MV E/A ratio:  0.82         Systemic VTI:  0.24 m  Systemic Diam: 2.10 cm Kirk Ruths MD Electronically signed by Kirk Ruths MD Signature Date/Time: 12/31/2020/10:23:47 AM    Final     Scheduled Meds: . calcium carbonate  800 mg of elemental calcium Oral TID WC  . Chlorhexidine Gluconate Cloth  6 each Topical Daily  . Chlorhexidine Gluconate Cloth  6 each Topical Q0600  . cholecalciferol  2,000 Units Oral Daily  . darbepoetin (ARANESP) injection - NON-DIALYSIS  100 mcg Subcutaneous Q Tue-1800  . insulin aspart  0-5 Units Subcutaneous QHS  . insulin aspart  0-9 Units Subcutaneous TID WC  . mouth rinse  15 mL Mouth Rinse BID  . metoprolol tartrate  50 mg Oral BID  . sodium bicarbonate  1,300 mg Oral BID   Continuous Infusions: . sodium chloride    . sodium chloride    . insulin Stopped (12/24/20 2100)  .  lactated ringers 100 mL/hr at 01/01/21 1248     LOS: 9 days    Time spent: 35 minutes    Barton Dubois, MD [facility]  If 7PM-7AM, please contact night-coverage [email or web address] 01/01/2021, 3:44 PM

## 2021-01-02 DIAGNOSIS — N179 Acute kidney failure, unspecified: Secondary | ICD-10-CM | POA: Diagnosis not present

## 2021-01-02 DIAGNOSIS — E872 Acidosis: Secondary | ICD-10-CM | POA: Diagnosis not present

## 2021-01-02 DIAGNOSIS — U071 COVID-19: Secondary | ICD-10-CM | POA: Diagnosis not present

## 2021-01-02 LAB — FERRITIN: Ferritin: 157 ng/mL (ref 24–336)

## 2021-01-02 LAB — RENAL FUNCTION PANEL
Albumin: 1.8 g/dL — ABNORMAL LOW (ref 3.5–5.0)
Anion gap: 11 (ref 5–15)
BUN: 86 mg/dL — ABNORMAL HIGH (ref 8–23)
CO2: 23 mmol/L (ref 22–32)
Calcium: 6.5 mg/dL — ABNORMAL LOW (ref 8.9–10.3)
Chloride: 103 mmol/L (ref 98–111)
Creatinine, Ser: 4.27 mg/dL — ABNORMAL HIGH (ref 0.61–1.24)
GFR, Estimated: 13 mL/min — ABNORMAL LOW (ref >60)
Glucose, Bld: 188 mg/dL — ABNORMAL HIGH (ref 70–99)
Phosphorus: 4.6 mg/dL (ref 2.5–4.6)
Potassium: 3.5 mmol/L (ref 3.5–5.1)
Sodium: 137 mmol/L (ref 135–145)

## 2021-01-02 LAB — GLUCOSE, CAPILLARY
Glucose-Capillary: 174 mg/dL — ABNORMAL HIGH (ref 70–99)
Glucose-Capillary: 260 mg/dL — ABNORMAL HIGH (ref 70–99)
Glucose-Capillary: 226 mg/dL — ABNORMAL HIGH (ref 70–99)
Glucose-Capillary: 226 mg/dL — ABNORMAL HIGH (ref 70–99)

## 2021-01-02 LAB — C-REACTIVE PROTEIN: CRP: 12.6 mg/dL — ABNORMAL HIGH (ref <1.0)

## 2021-01-02 MED ORDER — PANCRELIPASE (LIP-PROT-AMYL) 12000-38000 UNITS PO CPEP
12000.0000 [IU] | ORAL_CAPSULE | Freq: Three times a day (TID) | ORAL | Status: DC
  Administered 2021-01-02 – 2021-01-25 (×65): 12000 [IU] via ORAL
  Filled 2021-01-02 (×67): qty 1

## 2021-01-02 MED ORDER — LACTATED RINGERS IV SOLN: INTRAVENOUS | Status: AC

## 2021-01-02 MED ORDER — SACCHAROMYCES BOULARDII 250 MG PO CAPS
250.0000 mg | ORAL_CAPSULE | Freq: Two times a day (BID) | ORAL | Status: DC
  Administered 2021-01-02 – 2021-01-25 (×46): 250 mg via ORAL
  Filled 2021-01-02 (×47): qty 1

## 2021-01-02 NOTE — Progress Notes (Signed)
[facility] NEPHROLOGY PROGRESS NOTE  Assessment/ Plan: Pt is a 80 y.o. yo male with history of DM, hypertension, HLD, diastolic CHF, CAD, CKD stage IIIb who was admitted with generalized weakness, altered mental status and poor appetite seen for AKI on CKD.  #Acute kidney injury on CKD 3b, nonoliguric presumably due to ischemic ATN in the setting of volume depletion/hypotension and acute illness due to COVID-19 pneumonia.  He has received 2 dialysis session because of azotemia/uremia with improvement of mental status.  The BUN and creatinine level trending up off dialysis however the urine output is increasing. Plan to hold off dialysis today and watch out for renal recovery.  We will do strict ins and outs, daily lab monitoring.  Daily assessment for dialysis need.  Continue IV fluid for another 24 hours as he is still looks dry and has decreased oral intake.  #Acute metabolic encephalopathy: Multifactorial etiology including COVID-pneumonia, azotemia/uremia.  The mental status seems to be improving after dialysis.  Continue to watch.  Still looks very weak and deconditioned.  He will benefit from PT OT therapy.  # Anemia of acute illness: Monitor hemoglobin.  Received ESA.  Transfuse as needed.  He is COVID-positive.  #Secondary hyperparathyroidism.hypocalcemia: Vitamin D level 25.2, PTH 298.  Continue calcium carbonate and is started oral vitamin D.  # HTN/volume: Blood pressure acceptable.  Currently on metoprolol.  Continue monitor.  Discussed with the primary team.  Subjective: Seen and examined at bedside.  No new event.  Urine output is around 550 cc.  Denies nausea, vomiting, chest pain, shortness of breath.  He is alert awake and oriented.   Objective Vital signs in last 24 hours: Vitals:   01/01/21 1406 01/01/21 2044 01/02/21 0456 01/02/21 0500  BP: 121/60 136/67 (!) 146/72   Pulse: 86 85 88   Resp: 17 19 18    Temp: 99.2 F (37.3 C) 98.2 F (36.8 C) 98.1 F  (36.7 C)   TempSrc:      SpO2: 100% 98% 95%   Weight:    94.8 kg   Weight change: 0.1 kg  Intake/Output Summary (Last 24 hours) at 01/02/2021 0953 Last data filed at 01/02/2021 0500 Gross per 24 hour  Intake 1739.64 ml  Output 550 ml  Net 1189.64 ml       Labs: Basic Metabolic Panel: Recent Labs  Lab 12/31/20 0543 01/01/21 0638 01/02/21 0549  NA 137 137 137  K 3.3* 3.5 3.5  CL 102 103 103  CO2 24 25 23   GLUCOSE 189* 236* 188*  BUN 73* 82* 86*  CREATININE 3.54* 4.00* 4.27*  CALCIUM 6.5* 6.4* 6.5*  PHOS 4.5 4.7* 4.6   Liver Function Tests: Recent Labs  Lab 12/31/20 0543 01/01/21 0638 01/02/21 0549  ALBUMIN 1.8* 1.8* 1.8*   No results for input(s): LIPASE, AMYLASE in the last 168 hours. No results for input(s): AMMONIA in the last 168 hours. CBC: Recent Labs  Lab 12/27/20 0910 12/29/20 2014  WBC 10.4 16.4*  HGB 9.6* 8.8*  HCT 30.1* 26.2*  MCV 95.9 92.3  PLT 151 186   Cardiac Enzymes: No results for input(s): CKTOTAL, CKMB, CKMBINDEX, TROPONINI in the last 168 hours. CBG: Recent Labs  Lab 01/01/21 0712 01/01/21 1134 01/01/21 1615 01/01/21 2044 01/02/21 0738  GLUCAP 220* 306* 207* 191* 174*    Iron Studies:  Recent Labs    01/02/21 0549  FERRITIN 157   Studies/Results: ECHOCARDIOGRAM COMPLETE  Result Date: 12/31/2020    ECHOCARDIOGRAM REPORT   Patient Name:  Goddard Date of Exam: 12/31/2020 Medical Rec #:  [id number]            Height:       68.5 in Accession #:    [id number]           Weight:       208.6 lb Date of Birth:  31-Oct-1940            BSA:          2.092 m Patient Age:    89 years             BP:           146/63 mmHg Patient Gender: M                    HR:           84 bpm. Exam Location:  [facility] Procedure: 2D Echo, Cardiac Doppler and Color Doppler Indications:    Atrial Fibrillation I48.91  History:        Patient has prior history of Echocardiogram examinations, most                 recent 12/12/2011. Risk  Factors:Hypertension, Diabetes and                 Dyslipidemia.  Sonographer:    Alvino Chapel RCS Referring Phys: [place]  1. Left ventricular ejection fraction, by estimation, is 60 to 65%. The left ventricle has normal function. The left ventricle has no regional wall motion abnormalities. There is mild left ventricular hypertrophy. Left ventricular diastolic parameters are consistent with Grade I diastolic dysfunction (impaired relaxation).  2. Right ventricular systolic function is normal. The right ventricular size is normal. There is normal pulmonary artery systolic pressure.  3. The mitral valve is normal in structure. No evidence of mitral valve regurgitation. No evidence of mitral stenosis.  4. The aortic valve is tricuspid. Aortic valve regurgitation is not visualized. Mild aortic valve sclerosis is present, with no evidence of aortic valve stenosis.  5. Aortic dilatation noted. There is mild dilatation of the aortic root, measuring 39 mm.  6. The inferior vena cava is normal in size with greater than 50% respiratory variability, suggesting right atrial pressure of 3 mmHg. FINDINGS  Left Ventricle: Left ventricular ejection fraction, by estimation, is 60 to 65%. The left ventricle has normal function. The left ventricle has no regional wall motion abnormalities. The left ventricular internal cavity size was normal in size. There is  mild left ventricular hypertrophy. Left ventricular diastolic parameters are consistent with Grade I diastolic dysfunction (impaired relaxation). Right Ventricle: The right ventricular size is normal. Right ventricular systolic function is normal. There is normal pulmonary artery systolic pressure. The tricuspid regurgitant velocity is 2.82 m/s, and with an assumed right atrial pressure of 3 mmHg,  the estimated right ventricular systolic pressure is 12.1 mmHg. Left Atrium: Left atrial size was normal in size. Right Atrium: Right atrial size was normal  in size. Pericardium: There is no evidence of pericardial effusion. Mitral Valve: The mitral valve is normal in structure. Mild mitral annular calcification. No evidence of mitral valve regurgitation. No evidence of mitral valve stenosis. Tricuspid Valve: The tricuspid valve is normal in structure. Tricuspid valve regurgitation is mild . No evidence of tricuspid stenosis. Aortic Valve: The aortic valve is tricuspid. Aortic valve regurgitation is not visualized. Mild aortic valve sclerosis is present, with no evidence of aortic valve  stenosis. Pulmonic Valve: The pulmonic valve was normal in structure. Pulmonic valve regurgitation is not visualized. No evidence of pulmonic stenosis. Aorta: Aortic dilatation noted. There is mild dilatation of the aortic root, measuring 39 mm. Venous: The inferior vena cava is normal in size with greater than 50% respiratory variability, suggesting right atrial pressure of 3 mmHg.  LEFT VENTRICLE PLAX 2D LVIDd:         4.80 cm  Diastology LVIDs:         3.30 cm  LV e' medial:    5.66 cm/s LV PW:         1.10 cm  LV E/e' medial:  17.0 LV IVS:        1.50 cm  LV e' lateral:   8.81 cm/s LVOT diam:     2.10 cm  LV E/e' lateral: 10.9 LV SV:         85 LV SV Index:   40 LVOT Area:     3.46 cm  RIGHT VENTRICLE RV S prime:     12.00 cm/s TAPSE (M-mode): 2.1 cm LEFT ATRIUM             Index       RIGHT ATRIUM           Index LA diam:        3.00 cm 1.43 cm/m  RA Area:     16.10 cm LA Vol (A2C):   50.1 ml 23.95 ml/m RA Volume:   43.20 ml  20.65 ml/m LA Vol (A4C):   60.2 ml 28.78 ml/m LA Biplane Vol: 56.1 ml 26.82 ml/m  AORTIC VALVE LVOT Vmax:   110.00 cm/s LVOT Vmean:  79.300 cm/s LVOT VTI:    0.244 m  AORTA Ao Root diam: 3.90 cm MITRAL VALVE                TRICUSPID VALVE MV Area (PHT): 4.08 cm     TR Peak grad:   31.8 mmHg MV Decel Time: 186 msec     TR Vmax:        282.00 cm/s MV E velocity: 96.00 cm/s MV A velocity: 117.00 cm/s  SHUNTS MV E/A ratio:  0.82         Systemic VTI:  0.24  m                             Systemic Diam: 2.10 cm Kirk Ruths MD Electronically signed by Kirk Ruths MD Signature Date/Time: 12/31/2020/10:23:47 AM    Final     Medications: Infusions: . sodium chloride    . sodium chloride    . insulin Stopped (12/24/20 2100)  . lactated ringers 100 mL/hr at 01/01/21 2224    Scheduled Medications: . calcium carbonate  800 mg of elemental calcium Oral TID WC  . Chlorhexidine Gluconate Cloth  6 each Topical Daily  . Chlorhexidine Gluconate Cloth  6 each Topical Q0600  . cholecalciferol  2,000 Units Oral Daily  . darbepoetin (ARANESP) injection - NON-DIALYSIS  100 mcg Subcutaneous Q Tue-1800  . insulin aspart  0-5 Units Subcutaneous QHS  . insulin aspart  0-9 Units Subcutaneous TID WC  . mouth rinse  15 mL Mouth Rinse BID  . metoprolol tartrate  50 mg Oral BID  . sodium bicarbonate  1,300 mg Oral BID    have reviewed scheduled and prn medications.  Physical Exam: General:NAD, alert awake, able to lie flat comfortable Heart:RRR, s1s2 nl  Lungs:clear b/l, no crackle Abdomen:soft, Non-tender, non-distended Extremities:No edema Dialysis Access: Temporary HD catheter  [place] Strieter Prasad Jaysie Benthall 01/02/2021,9:53 AM  LOS: 10 days

## 2021-01-02 NOTE — Plan of Care (Signed)
  Problem: Acute Rehab PT Goals(only PT should resolve) Goal: Pt Will Go Supine/Side To Sit Outcome: Progressing Flowsheets (Taken 01/02/2021 1424) Pt will go Supine/Side to Sit: with minimal assist Goal: Patient Will Transfer Sit To/From Stand Outcome: Progressing Flowsheets (Taken 01/02/2021 1424) Patient will transfer sit to/from stand:  with minimal assist  with moderate assist Goal: Pt Will Transfer Bed To Chair/Chair To Bed Outcome: Progressing Flowsheets (Taken 01/02/2021 1424) Pt will Transfer Bed to Chair/Chair to Bed:  with min assist  with mod assist Goal: Pt Will Ambulate Outcome: Progressing Flowsheets (Taken 01/02/2021 1424) Pt will Ambulate:  25 feet  with min guard assist  with minimal assist  with rolling walker   2:27 PM, 01/02/21 Lonell Grandchild, MPT Physical Therapist with [facility] [phone] office [phone] mobile phone

## 2021-01-02 NOTE — Progress Notes (Signed)
PROGRESS NOTE    [name]  BJY:[id number] DOB: 1940/08/22 DOA: 12/23/2020 PCP: Mark Frizzle, MD   Brief Narrative:   Mark Dicksonis a 79 y.o.malewith medical history significant fortype 2 diabetes, CKD stage IIIb, hypertension, dyslipidemia, diastolic CHF, and CAD described to the ED via EMS with increasing weakness and confusion along with poor appetite for the last 2 to 3 days.Patient was admitted with AKI and severe acidosis along with hyperglycemia.  He continues to have persistent AKI with uremic encephalopathy.  He is also noted to be COVID-positive and has been started on remdesivir.  Assessment & Plan:   Active Problems:   AKI (acute kidney injury) ([place])   Generalized weakness with acute metabolic encephalopathy secondary to AKI with severe metabolic acidosis and uncompensated respiratory alkalosis -Suspected to be secondary to uremic encephalopathy. -acidosis due to AKI; sodium bicarbonate 23 today after second HD tx on 5/28 -mentation has remained stable; patient cooperative with examination and oriented x3..  Non-oliguricAKIon CKD3b-suspect ischemic ATN in the setting of prerenal with dehydration, volume depletion, hypotension and acute COVID infection. -Renal ultrasoundwithout hydronephrosis -Continue avoiding nephrotoxic agents, hypotension and the use of contrast. -General surgery placed left internal jugular dialysis catheter 12/29/2020; no pneumothorax and in good position.  First dialysis provided on 12/29/2020, overall well-tolerated.  Second dialysis treatment much better tolerated on 0/86/5784.  -Metabolic acidosis is now resolved; bicarb 25.  Creatinine level up to 4.27 and BUN 86 after 48 hours without dialysis treatment.   -Continue monitoring urine output and further renal function trend  -Follow recommendations by nephrology service regarding next dialysis treatment needs.  For now continue judicious fluid resuscitation and close  monitoring of I's and O's.  Hypocalcemia -Continue to follow electrolytes trend -Continue telemetry monitoring. -Continue Tums as per nephrology recommendation.  Hyperglycemiain setting of DM2with mild DKA -resolved -Compliant with home medications according to wife at bedside -Likely stress-induced -Hemoglobin A1c7.0% demonstrating good control. -Continue sliding scale insulin and follow CBGs fluctuation.  COVID-19 infection -Chest x-ray with stable cardiomegaly and no other signs of infiltrate -Patient received 3 days of remdesivir for asymptomatic COVID-19 infection. -No need for steroids given no hypoxemia -Continue isolation precautions and continue practicing diet 3 W's (Wait 6 feet apart, Wash hands frequently and Wear a mask) at time of discharge. -Patient is afebrile, in no distress and denies shortness of breath currently.  Thrombocytopenia -Overall stable -Continue to monitor platelet count trend. -Continue avoiding heparin agents  Hypertension/CAD -Continue holding home medications for now until mentation further improves and his renal function further stabilize. -Continue to follow vital signs. -No wall motion abnormality on 2D echo.  Very Hard of hearing -Patient uses hearing aids at baseline.  A. Fib with RVR -No sustained and associated with hemodialysis initiation -After resumption of beta-blocker and dose adjustment provided; patient has remained normal rate and sinus rhythm. -Denies palpitations, chest pain and shortness of breath. -2D echo with preserved ejection fraction, no wall motion normalities and grade 1 diastolic dysfunction.  No significant valvular disorder appreciated.  Physical deconditioning -Physical therapy has seen patient and recommended to skilled nursing facility at discharge for rehabilitation.  DVT prophylaxis:SCDs Code Status:Full Family Communication:Wife updated at bedside 01/01/2021; unable to reach up with the phone  on 01/02/2021. Disposition Plan: Status is: Inpatient  Remains inpatient appropriate because:Altered mental status, IV treatments appropriate due to intensity of illness or inability to take PO and Inpatient level of care appropriate due to severity of illness   Dispo: The  patient is from:Home Anticipated d/c is OL:[facility] Patient currently is not medically stable to d/c. Difficult to place patient No   Consultants:  Nephrology  General surgery  Procedures:  See below  2D echo 1. Left ventricular ejection fraction, by estimation, is 60 to 65%. The  left ventricle has normal function. The left ventricle has no regional  wall motion abnormalities. There is mild left ventricular hypertrophy.  Left ventricular diastolic parameters  are consistent with Grade I diastolic dysfunction (impaired relaxation).  2. Right ventricular systolic function is normal. The right ventricular  size is normal. There is normal pulmonary artery systolic pressure.  3. The mitral valve is normal in structure. No evidence of mitral valve  regurgitation. No evidence of mitral stenosis.  4. The aortic valve is tricuspid. Aortic valve regurgitation is not  visualized. Mild aortic valve sclerosis is present, with no evidence of  aortic valve stenosis.  5. Aortic dilatation noted. There is mild dilatation of the aortic root,  measuring 39 mm.  6. The inferior vena cava is normal in size with greater than 50%  respiratory variability, suggesting right atrial pressure of 3 mmHg.   Antimicrobials:  Anti-infectives (From admission, onward)   Start     Dose/Rate Route Frequency Ordered Stop   12/24/20 1000  remdesivir 100 mg in sodium chloride 0.9 % 100 mL IVPB        100 mg 200 mL/hr over 30 Minutes Intravenous Daily 12/23/20 1637 12/27/20 1200   12/23/20 1645  remdesivir 100 mg in sodium chloride 0.9 % 100 mL IVPB        100 mg 200 mL/hr over 30 Minutes  Intravenous Every 1 hr x 2 12/23/20 1636 12/23/20 1908      Subjective: No fever, no chest pain, no nausea, no vomiting.  Reports decreased appetite and weakness.  Increasing urine output appreciated.  Objective: Vitals:   01/01/21 2044 01/02/21 0456 01/02/21 0500 01/02/21 1346  BP: 136/67 (!) 146/72  (!) 142/70  Pulse: 85 88  89  Resp: 19 18  18   Temp: 98.2 F (36.8 C) 98.1 F (36.7 C)  98.9 F (37.2 C)  TempSrc:    Oral  SpO2: 98% 95%  98%  Weight:   94.8 kg     Intake/Output Summary (Last 24 hours) at 01/02/2021 1714 Last data filed at 01/02/2021 1533 Gross per 24 hour  Intake 2096.71 ml  Output 400 ml  Net 1696.71 ml   Filed Weights   12/31/20 0500 01/01/21 0500 01/02/21 0500  Weight: 94.6 kg 94.7 kg 94.8 kg    Examination: General exam: Alert, awake, oriented x 3; no chest pain, no nausea, no vomiting, reports decreased appetite and feeling weak.  When patient has his hearing aids in place is able to communicate and interact properly.  No fever.  Increased urine output reported. Respiratory system: Clear to auscultation. Respiratory effort normal.  No using accessory muscle. Cardiovascular system: RRR, no rubs, no gallops, no JVD. Gastrointestinal system: Abdomen is nondistended, soft and nontender. No organomegaly or masses felt. Normal bowel sounds heard. Central nervous system: Alert and oriented. No focal neurological deficits. Extremities: No cyanosis or clubbing; no edema appreciated. Skin: No petechiae.  Left temporary hemodialysis catheter in place. Psychiatry:  Mood & affect appropriate.    Data Reviewed: I have personally reviewed following labs and imaging studies  CBC: Recent Labs  Lab 12/27/20 0910 12/29/20 2014  WBC 10.4 16.4*  HGB 9.6* 8.8*  HCT 30.1* 26.2*  MCV 95.9  92.3  PLT 151 527   Basic Metabolic Panel: Recent Labs  Lab 12/27/20 0515 12/28/20 0421 12/29/20 0414 12/30/20 0448 12/31/20 0543 01/01/21 0638 01/02/21 0549  NA 144    < > 142 139 137 137 137  K 3.5   < > 3.4* 3.2* 3.3* 3.5 3.5  CL 112*   < > 113* 107 102 103 103  CO2 17*   < > 14* 21* 24 25 23   GLUCOSE 141*   < > 207* 206* 189* 236* 188*  BUN 120*   < > 142* 101* 73* 82* 86*  CREATININE 5.77*   < > 5.92* 4.49* 3.54* 4.00* 4.27*  CALCIUM 6.5*   < > 6.8* 6.6* 6.5* 6.4* 6.5*  MG 2.1  --   --   --   --   --   --   PHOS 10.4*   < > 8.8* 5.4* 4.5 4.7* 4.6   < > = values in this interval not displayed.   GFR: Estimated Creatinine Clearance: 15.8 mL/min (A) (by C-G formula based on SCr of 4.27 mg/dL (H)).   Liver Function Tests: Recent Labs  Lab 12/29/20 0414 12/30/20 0448 12/31/20 0543 01/01/21 0638 01/02/21 0549  ALBUMIN 2.3* 2.0* 1.8* 1.8* 1.8*   CBG: Recent Labs  Lab 01/01/21 1615 01/01/21 2044 01/02/21 0738 01/02/21 1034 01/02/21 1606  GLUCAP 207* 191* 174* 260* 226*   Anemia Panel: Recent Labs    01/01/21 0638 01/02/21 0549  FERRITIN 111 157   Sepsis Labs: No results for input(s): PROCALCITON, LATICACIDVEN in the last 168 hours.  Recent Results (from the past 240 hour(s))  Urine culture     Status: Abnormal   Collection Time: 12/23/20  6:28 PM   Specimen: Urine, Clean Catch  Result Value Ref Range Status   Specimen Description   Final    URINE, CLEAN CATCH Performed at [facility], [address] [place], [place] [address]    Special Requests   Final    NONE Performed at [facility], [address] [place], [place] [address]    Culture (A)  Final    <10,000 COLONIES/mL INSIGNIFICANT GROWTH Performed at [facility] [address], [place], [place] [address]    Report Status 12/25/2020 FINAL  Final  MRSA PCR Screening     Status: None   Collection Time: 12/23/20  9:59 PM   Specimen: Nasal Mucosa; Nasopharyngeal  Result Value Ref Range Status   MRSA by PCR NEGATIVE NEGATIVE Final    Comment:        The GeneXpert MRSA Assay (FDA approved for NASAL specimens only), is one component of a comprehensive MRSA  colonization surveillance program. It is not intended to diagnose MRSA infection nor to guide or monitor treatment for MRSA infections. Performed at [facility], [address], [place], [place] [address]      Radiology Studies: No results found.  Scheduled Meds: . calcium carbonate  800 mg of elemental calcium Oral TID WC  . Chlorhexidine Gluconate Cloth  6 each Topical Daily  . Chlorhexidine Gluconate Cloth  6 each Topical Q0600  . cholecalciferol  2,000 Units Oral Daily  . darbepoetin (ARANESP) injection - NON-DIALYSIS  100 mcg Subcutaneous Q Tue-1800  . insulin aspart  0-5 Units Subcutaneous QHS  . insulin aspart  0-9 Units Subcutaneous TID WC  . lipase/protease/amylase  12,000 Units Oral TID AC  . mouth rinse  15 mL Mouth Rinse BID  . metoprolol tartrate  50 mg Oral  BID  . saccharomyces boulardii  250 mg Oral BID  . sodium bicarbonate  1,300 mg Oral BID   Continuous Infusions: . sodium chloride    . sodium chloride    . insulin Stopped (12/24/20 2100)  . lactated ringers 100 mL/hr at 01/02/21 1533     LOS: 10 days    Time spent: 35 minutes    Barton Dubois, MD [facility]  If 7PM-7AM, please contact night-coverage [email or web address] 01/02/2021, 5:14 PM

## 2021-01-02 NOTE — Evaluation (Signed)
Physical Therapy Evaluation Patient Details Name: Mark SANDATE Sr. MRN: [id number] DOB: 07-18-1941 Today's Date: 01/02/2021   History of Present Illness  Mark L Bergemann Sr. is a 80 y.o. male with medical history significant for type 2 diabetes, CKD stage IIIb, hypertension, dyslipidemia, diastolic CHF, and CAD described to the ED via EMS with increasing weakness and confusion along with poor appetite for the last 2 to 3 days.  Symptoms have been present over the past week and patient was diagnosed with COVID-19 infection on May 7.  He saw his PCP on 5/19 for confusion and was not noted to be hypoxemic at that time.  He was told to hold his Lasix and encouraged to drink plenty of fluids, but he really has not been able to drink very much fluids according to the wife at the bedside.  He was started on some mild delirium related to his COVID infection.  Unfortunately, his symptoms have progressed significantly to days.  Patient cannot give any further history at this time given his severe confusion.  Primary historian his wife at bedside.    Clinical Impression  Patient demonstrates slow labored movement for sitting up at bedside requiring Mod/max assist, required verbal/tactile cueing for proper hand placement on RW during sit to stands with fair carryover, very unsteady on feet and limited to a few side steps before having to sit due to fatigue and generalized weakness. Patient tolerated sitting up in chair after therapy with his spouse present in room.  Patient will benefit from continued physical therapy in hospital and recommended venue below to increase strength, balance, endurance for safe ADLs and gait.     Follow Up Recommendations SNF    Equipment Recommendations       Recommendations for Other Services       Precautions / Restrictions Precautions Precautions: Fall Restrictions Weight Bearing Restrictions: No      Mobility  Bed Mobility Overal bed mobility: Needs  Assistance Bed Mobility: Supine to Sit     Supine to sit: Mod assist;Max assist     General bed mobility comments: increased time, labored movement    Transfers Overall transfer level: Needs assistance   Transfers: Sit to/from Stand;Stand Pivot Transfers Sit to Stand: Mod assist Stand pivot transfers: Mod assist       General transfer comment: unsteady labored movement, frequent verbal/tactile cueing for proper hand placement on walker  Ambulation/Gait Ambulation/Gait assistance: Mod assist;Max assist Gait Distance (Feet): 4 Feet Assistive device: Rolling walker (2 wheeled) Gait Pattern/deviations: Decreased step length - right;Decreased step length - left;Decreased stride length Gait velocity: decreased   General Gait Details: limited to 4-5 slow labored side steps with near loss of balance due to weakness  Stairs            Wheelchair Mobility    Modified Rankin (Stroke Patients Only)       Balance Overall balance assessment: Needs assistance Sitting-balance support: Feet supported;No upper extremity supported Sitting balance-Leahy Scale: Fair Sitting balance - Comments: fair/good seated at EOB   Standing balance support: During functional activity;Bilateral upper extremity supported Standing balance-Leahy Scale: Poor Standing balance comment: using RW                             Pertinent Vitals/Pain Pain Assessment: No/denies pain    Home Living Family/patient expects to be discharged to:: Private residence Living Arrangements: Spouse/significant other Available Help at Discharge: Family;Available 24 hours/day Type of Home:  House Home Access: Ramped entrance     Home Layout: One level Home Equipment: [place] - 2 wheels;Cane - single point;Grab bars - tub/shower;Grab bars - toilet;Bedside commode;Shower seat - built in      Prior Function Level of Independence: Independent with assistive device(s)         Comments: household  ambulator using RW recently, usually uses SPC     Hand Dominance        Extremity/Trunk Assessment   Upper Extremity Assessment Upper Extremity Assessment: Generalized weakness    Lower Extremity Assessment Lower Extremity Assessment: Generalized weakness    Cervical / Trunk Assessment Cervical / Trunk Assessment: Normal  Communication   Communication: HOH  Cognition Arousal/Alertness: Awake/alert Behavior During Therapy: WFL for tasks assessed/performed Overall Cognitive Status: Within Functional Limits for tasks assessed                                        General Comments      Exercises     Assessment/Plan    PT Assessment Patient needs continued PT services  PT Problem List Decreased strength;Decreased activity tolerance;Decreased balance;Decreased mobility       PT Treatment Interventions DME instruction;Gait training;Stair training;Functional mobility training;Therapeutic activities;Therapeutic exercise;Patient/family education;Balance training    PT Goals (Current goals can be found in the Care Plan section)  Acute Rehab PT Goals Patient Stated Goal: return home after rehab PT Goal Formulation: With patient/family Time For Goal Achievement: 01/16/21 Potential to Achieve Goals: Good    Frequency Min 3X/week   Barriers to discharge        Co-evaluation               AM-PAC PT "6 Clicks" Mobility  Outcome Measure Help needed turning from your back to your side while in a flat bed without using bedrails?: A Lot Help needed moving from lying on your back to sitting on the side of a flat bed without using bedrails?: A Lot Help needed moving to and from a bed to a chair (including a wheelchair)?: A Lot Help needed standing up from a chair using your arms (e.g., wheelchair or bedside chair)?: A Lot Help needed to walk in hospital room?: A Lot Help needed climbing 3-5 steps with a railing? : Total 6 Click Score: 11    End of  Session   Activity Tolerance: Patient limited by fatigue;Patient tolerated treatment well Patient left: in chair;with call bell/phone within reach;with chair alarm set;with family/visitor present Nurse Communication: Mobility status PT Visit Diagnosis: Unsteadiness on feet (R26.81);Other abnormalities of gait and mobility (R26.89);Muscle weakness (generalized) (M62.81)    Time: 8466-5993 PT Time Calculation (min) (ACUTE ONLY): 24 min   Charges:   PT Evaluation $PT Eval Moderate Complexity: 1 Mod PT Treatments $Therapeutic Activity: 23-37 mins        2:21 PM, 01/02/21 Lonell Grandchild, MPT Physical Therapist with [facility] [phone] office [phone] mobile phone

## 2021-01-03 DIAGNOSIS — E872 Acidosis, unspecified: Secondary | ICD-10-CM

## 2021-01-03 DIAGNOSIS — N179 Acute kidney failure, unspecified: Secondary | ICD-10-CM

## 2021-01-03 DIAGNOSIS — U071 COVID-19: Secondary | ICD-10-CM | POA: Diagnosis not present

## 2021-01-03 DIAGNOSIS — N1832 Chronic kidney disease, stage 3b: Secondary | ICD-10-CM

## 2021-01-03 DIAGNOSIS — G9341 Metabolic encephalopathy: Secondary | ICD-10-CM | POA: Diagnosis not present

## 2021-01-03 LAB — RENAL FUNCTION PANEL
Albumin: 1.8 g/dL — ABNORMAL LOW (ref 3.5–5.0)
Anion gap: 10 (ref 5–15)
BUN: 87 mg/dL — ABNORMAL HIGH (ref 8–23)
CO2: 24 mmol/L (ref 22–32)
Calcium: 6.4 mg/dL — CL (ref 8.9–10.3)
Chloride: 103 mmol/L (ref 98–111)
Creatinine, Ser: 4.41 mg/dL — ABNORMAL HIGH (ref 0.61–1.24)
GFR, Estimated: 13 mL/min — ABNORMAL LOW (ref >60)
Glucose, Bld: 170 mg/dL — ABNORMAL HIGH (ref 70–99)
Phosphorus: 4.4 mg/dL (ref 2.5–4.6)
Potassium: 3.6 mmol/L (ref 3.5–5.1)
Sodium: 137 mmol/L (ref 135–145)

## 2021-01-03 LAB — FERRITIN: Ferritin: 133 ng/mL (ref 24–336)

## 2021-01-03 LAB — GLUCOSE, CAPILLARY
Glucose-Capillary: 157 mg/dL — ABNORMAL HIGH (ref 70–99)
Glucose-Capillary: 242 mg/dL — ABNORMAL HIGH (ref 70–99)
Glucose-Capillary: 283 mg/dL — ABNORMAL HIGH (ref 70–99)
Glucose-Capillary: 217 mg/dL — ABNORMAL HIGH (ref 70–99)

## 2021-01-03 LAB — C-REACTIVE PROTEIN: CRP: 12.8 mg/dL — ABNORMAL HIGH (ref <1.0)

## 2021-01-03 MED ORDER — ASPIRIN EC 81 MG PO TBEC
81.0000 mg | DELAYED_RELEASE_TABLET | Freq: Every day | ORAL | Status: DC
  Administered 2021-01-03 – 2021-01-25 (×22): 81 mg via ORAL
  Filled 2021-01-03 (×24): qty 1

## 2021-01-03 MED ORDER — CALCIUM GLUCONATE-NACL 1-0.675 GM/50ML-% IV SOLN
1.0000 g | Freq: Once | INTRAVENOUS | Status: AC
  Administered 2021-01-03: 1000 mg via INTRAVENOUS
  Filled 2021-01-03: qty 50

## 2021-01-03 MED ORDER — CALCIUM GLUCONATE-NACL 2-0.675 GM/100ML-% IV SOLN: 2.0000 g | Freq: Once | INTRAVENOUS | Status: DC

## 2021-01-03 NOTE — Progress Notes (Addendum)
Physical Therapy Treatment Patient Details Name: Mark STOIBER Sr. MRN: [id number] DOB: 03-03-1941 Today's Date: 01/03/2021    History of Present Illness Mark L Ethington Sr. is a 80 y.o. male with medical history significant for type 2 diabetes, CKD stage IIIb, hypertension, dyslipidemia, diastolic CHF, and CAD described to the ED via EMS with increasing weakness and confusion along with poor appetite for the last 2 to 3 days.  Symptoms have been present over the past week and patient was diagnosed with COVID-19 infection on May 7.  He saw his PCP on 5/19 for confusion and was not noted to be hypoxemic at that time.  He was told to hold his Lasix and encouraged to drink plenty of fluids, but he really has not been able to drink very much fluids according to the wife at the bedside.  He was started on some mild delirium related to his COVID infection.  Unfortunately, his symptoms have progressed significantly to days.  Patient cannot give any further history at this time given his severe confusion.  Primary historian his wife at bedside.    PT Comments    Patient presents supine in bed and was able to transfer to EOB with min assist. Patient was able to perform therapeutic activities at the EOB including; heel raises, long arc quads, and seated march. Patient was able to maintain good/fair seated balance during exercises. Patient required mod assist to transfer to the chair post exercise and limited mostly due to fatigue and generalized weakness. Patient was left in chair - RN notified. Patient will benefit from continued physical therapy in hospital and recommended venue below to increase strength, balance, endurance for safe ADLs and gait.   Follow Up Recommendations  SNF     Equipment Recommendations       Recommendations for Other Services       Precautions / Restrictions Precautions Precautions: Fall Restrictions Weight Bearing Restrictions: No    Mobility  Bed Mobility Overal  bed mobility: Needs Assistance Bed Mobility: Supine to Sit     Supine to sit: Mod assist     General bed mobility comments: increased time, labored movement    Transfers Overall transfer level: Needs assistance Equipment used: Standard walker Transfers: Sit to/from Stand;Stand Pivot Transfers Sit to Stand: Mod assist Stand pivot transfers: Mod assist       General transfer comment: slow labored movement, required verbal/tactile cues for hand placement and WB throught the walker  Ambulation/Gait Ambulation/Gait assistance: Mod assist;Max assist Gait Distance (Feet): 4 Feet Assistive device: Rolling walker (2 wheeled) Gait Pattern/deviations: Decreased step length - right;Decreased step length - left;Decreased stride length Gait velocity: decreased   General Gait Details: limited to 4-5 steps during the stand pivot transfer   Stairs             Wheelchair Mobility    Modified Rankin (Stroke Patients Only)       Balance Overall balance assessment: Needs assistance Sitting-balance support: Feet supported;No upper extremity supported Sitting balance-Leahy Scale: Fair Sitting balance - Comments: fair/good seated at EOB   Standing balance support: During functional activity;Bilateral upper extremity supported   Standing balance comment: using RW                            Cognition Arousal/Alertness: Awake/alert Behavior During Therapy: WFL for tasks assessed/performed Overall Cognitive Status: Within Functional Limits for tasks assessed  Exercises General Exercises - Lower Extremity Long Arc Quad: AROM;Strengthening;Seated;Both;10 reps Hip Flexion/Marching: AROM;Seated;Strengthening;Both;10 reps Heel Raises: AROM;Seated;Strengthening;Both;10 reps    General Comments        Pertinent Vitals/Pain Pain Assessment: No/denies pain    Home Living                      Prior  Function            PT Goals (current goals can now be found in the care plan section) Acute Rehab PT Goals Patient Stated Goal: return home after rehab PT Goal Formulation: With patient Time For Goal Achievement: 01/16/21 Potential to Achieve Goals: Good Progress towards PT goals: Progressing toward goals    Frequency    Min 3X/week      PT Plan Current plan remains appropriate    Co-evaluation              AM-PAC PT "6 Clicks" Mobility   Outcome Measure  Help needed turning from your back to your side while in a flat bed without using bedrails?: A Lot Help needed moving from lying on your back to sitting on the side of a flat bed without using bedrails?: A Lot Help needed moving to and from a bed to a chair (including a wheelchair)?: A Lot Help needed standing up from a chair using your arms (e.g., wheelchair or bedside chair)?: A Lot Help needed to walk in hospital room?: A Lot Help needed climbing 3-5 steps with a railing? : Total 6 Click Score: 11    End of Session   Activity Tolerance: Patient limited by fatigue;Patient tolerated treatment well Patient left: in chair;with call bell/phone within reach;with chair alarm set Nurse Communication: Mobility status PT Visit Diagnosis: Unsteadiness on feet (R26.81);Other abnormalities of gait and mobility (R26.89);Muscle weakness (generalized) (M62.81)     Time: 7121-9758 PT Time Calculation (min) (ACUTE ONLY): 28 min  Charges:  $Therapeutic Exercise: 8-22 mins $Therapeutic Activity: 8-22 mins                     3:54 PM, 01/03/21 Jeneen Rinks Cousler SPT  3:54 PM, 01/03/21 Lonell Grandchild, MPT Physical Therapist with [facility] [phone] office [phone] mobile phone

## 2021-01-03 NOTE — Progress Notes (Signed)
PROGRESS NOTE  [name] TMH:[id number] DOB: Apr 08, 1941 DOA: 12/23/2020 PCP: [name], MD  Brief History:  Mark Dicksonis a 80 y.o.malewith medical history significant fortype 2 diabetes, CKD stage IIIb, hypertension, dyslipidemia, diastolic CHF, and CAD described to the ED via EMS with increasing weakness and confusion along with poor appetite for the last 2 to 3 days.Patient was admitted with AKI and severe acidosis along with hyperglycemia.  He continues to have persistent AKI with uremic encephalopathy.  He is also noted to be COVID-positive and has been started on remdesivir.  Patient remained stable on RA.  He received 3 days remdesivir.  Unfortunately, he remained confused and his renal function continued to deteriorate.  Nephrology was consulted.  A temporary HD catheter was place on 12/29/20 and patient was initiated on HD on 12/29/20 and 5/28.  Now, the patient is non-oliguric with close observation for renal recovery.  With HD, his mental status improved.  Assessment/Plan: Generalized weakness with acute metabolic encephalopathy  -secondary to AKI with severe metabolic acidosis and uncompensated respiratory alkalosis -Suspected to be secondary to uremic encephalopathy. -acidosis due to AKI; -mentation improving and stable; patient cooperative with examination and oriented x3..  Non-oliguricAKIon CKD3b -baseline creatinine 1.7-2.0 -suspect ischemic ATN in the setting of prerenal with dehydration, volume depletion, hypotension and acute COVID infection. -Renal ultrasoundwithout hydronephrosis -avoiding nephrotoxic agents, hypotension and the use of contrast. -General surgery placed left internal jugular dialysis catheter 12/29/2020; -First dialysis provided on 12/29/2020, overall well-tolerated.  Second dialysis treatment much better tolerated on 12/30/2020.  -now monitoring for renal recovery -Continue IVF per  nephrology  Hypocalcemia -Continue to follow electrolytes trend -Continue telemetry monitoring. -Continue Tums as per nephrology recommendation. -calcium gluconate x 2 g on 6/1  Hyperglycemiain setting of DM2with mild DKA -resolved -Compliant with home medications according to wife at bedside -Likely stress-induced -Hemoglobin A1c7.0% demonstrating good control. -Continue sliding scale insulin and follow CBGs fluctuation.  COVID-19 infection -Chest x-ray with stable cardiomegaly and no other signs of infiltrate -Patient received 3 days of remdesivir for asymptomatic COVID-19 infection. -No need for steroids as pt stable on RA -Continue isolation precautions and continue practicing diet 3 W's (Wait 6 feet apart, Wash hands frequently and Wear a mask) at time of discharge.  Thrombocytopenia -Overall stable; likely due to acute medical illness -Continue to monitor platelet count trend. -Continue avoiding heparin agents -B12 -folate -TSH  Hypertension/CAD -continue metoprolol -no chest pain -12/31/20 Echo--EF 60-65%, no WMA, G1DD  Very Hard of hearing -Patient uses hearing aids at baseline.  A. Fib with RVR, type unspecified -No sustained and associated with hemodialysis initiation -After resumption of metoprolol and dose adjustment provided; patient has remained normal rate and sinus rhythm. -Denies palpitations, chest pain and shortness of breath. -2D echo with preserved ejection fraction, no wall motion normalities and grade 1 diastolic dysfunction.  No significant valvular disorder appreciated.  Physical deconditioning -Physical therapy has seen patient and recommended to skilled nursing facility at discharge for rehabilitation.     Status is: Inpatient  Remains inpatient appropriate because:IV treatments appropriate due to intensity of illness or inability to take PO   Dispo: The patient is from: Home              Anticipated d/c is to: SNF               Patient currently is not medically stable to d/c.   Difficult to place  patient No        Family Communication:   No amily at bedside  Consultants:  renal  Code Status:  FULL   DVT Prophylaxis:  SCDs   Procedures: As Listed in Progress Note Above  Antibiotics: None       Subjective: Patient denies fevers, chills, headache, chest pain, dyspnea, nausea, vomiting, diarrhea, abdominal pain, dysuria, hematuria, hematochezia, and melena.   Objective: Vitals:   01/03/21 0331 01/03/21 0619 01/03/21 0810 01/03/21 1331  BP:  (!) 149/72 (!) 148/81 (!) 156/82  Pulse:  86 (!) 115 90  Resp:  18  15  Temp:  98 F (36.7 C)  99.6 F (37.6 C)  TempSrc:    Oral  SpO2:  96% 96% 95%  Weight: 94.9 kg       Intake/Output Summary (Last 24 hours) at 01/03/2021 1712 Last data filed at 01/03/2021 1343 Gross per 24 hour  Intake 1440 ml  Output 1150 ml  Net 290 ml   Weight change: 0.1 kg Exam:   General:  Pt is alert, follows commands appropriately, not in acute distress  HEENT: No icterus, No thrush, No neck mass, [place]/AT  Cardiovascular: RRR, S1/S2, no rubs, no gallops  Respiratory: bibasilar crackles. No wheeze  Abdomen: Soft/+BS, non tender, non distended, no guarding  Extremities:  Trace LE edema, No lymphangitis, No petechiae, No rashes, no synovitis   Data Reviewed: I have personally reviewed following labs and imaging studies Basic Metabolic Panel: Recent Labs  Lab 12/30/20 0448 12/31/20 0543 01/01/21 5102 01/02/21 0549 01/03/21 0653  NA 139 137 137 137 137  K 3.2* 3.3* 3.5 3.5 3.6  CL 107 102 103 103 103  CO2 21* 24 25 23 24   GLUCOSE 206* 189* 236* 188* 170*  BUN 101* 73* 82* 86* 87*  CREATININE 4.49* 3.54* 4.00* 4.27* 4.41*  CALCIUM 6.6* 6.5* 6.4* 6.5* 6.4*  PHOS 5.4* 4.5 4.7* 4.6 4.4   Liver Function Tests: Recent Labs  Lab 12/30/20 0448 12/31/20 0543 01/01/21 5852 01/02/21 0549 01/03/21 0653  ALBUMIN 2.0* 1.8* 1.8* 1.8* 1.8*   No results  for input(s): LIPASE, AMYLASE in the last 168 hours. No results for input(s): AMMONIA in the last 168 hours. Coagulation Profile: Recent Labs  Lab 12/28/20 1312  INR 1.5*   CBC: Recent Labs  Lab 12/29/20 2014  WBC 16.4*  HGB 8.8*  HCT 26.2*  MCV 92.3  PLT 186   Cardiac Enzymes: No results for input(s): CKTOTAL, CKMB, CKMBINDEX, TROPONINI in the last 168 hours. BNP: Invalid input(s): POCBNP CBG: Recent Labs  Lab 01/02/21 1606 01/02/21 1949 01/03/21 0729 01/03/21 1105 01/03/21 1654  GLUCAP 226* 226* 157* 242* 283*   HbA1C: No results for input(s): HGBA1C in the last 72 hours. Urine analysis:    Component Value Date/Time   COLORURINE YELLOW 12/23/2020 1828   APPEARANCEUR CLEAR 12/23/2020 1828   LABSPEC 1.009 12/23/2020 1828   PHURINE 5.0 12/23/2020 1828   GLUCOSEU 50 (A) 12/23/2020 1828   HGBUR SMALL (A) 12/23/2020 1828   BILIRUBINUR NEGATIVE 12/23/2020 1828   KETONESUR NEGATIVE 12/23/2020 1828   PROTEINUR NEGATIVE 12/23/2020 1828   UROBILINOGEN 0.2 03/20/2013 2016   NITRITE NEGATIVE 12/23/2020 1828   LEUKOCYTESUR NEGATIVE 12/23/2020 1828   Sepsis Labs: @LABRCNTIP (procalcitonin:4,lacticidven:4) )No results found for this or any previous visit (from the past 240 hour(s)).   Scheduled Meds: . calcium carbonate  800 mg of elemental calcium Oral TID WC  . Chlorhexidine Gluconate Cloth  6 each Topical Daily  .  Chlorhexidine Gluconate Cloth  6 each Topical Q0600  . cholecalciferol  2,000 Units Oral Daily  . darbepoetin (ARANESP) injection - NON-DIALYSIS  100 mcg Subcutaneous Q Tue-1800  . insulin aspart  0-5 Units Subcutaneous QHS  . insulin aspart  0-9 Units Subcutaneous TID WC  . lipase/protease/amylase  12,000 Units Oral TID AC  . mouth rinse  15 mL Mouth Rinse BID  . metoprolol tartrate  50 mg Oral BID  . saccharomyces boulardii  250 mg Oral BID  . sodium bicarbonate  1,300 mg Oral BID   Continuous Infusions: . sodium chloride    . sodium chloride     . insulin Stopped (12/24/20 2100)    Procedures/Studies: CT Head Wo Contrast  Result Date: 12/23/2020 CLINICAL DATA:  Mental status changes EXAM: CT HEAD WITHOUT CONTRAST TECHNIQUE: Contiguous axial images were obtained from the base of the skull through the vertex without intravenous contrast. COMPARISON:  12/04/2018 FINDINGS: Brain: No acute intracranial abnormality. Specifically, no hemorrhage, hydrocephalus, mass lesion, acute infarction, or significant intracranial injury. There is atrophy and chronic small vessel disease changes. Vascular: No hyperdense vessel or unexpected calcification. Skull: No acute calvarial abnormality. Sinuses/Orbits: No acute findings Other: None IMPRESSION: Atrophy, chronic microvascular disease. No acute intracranial abnormality. Electronically Signed   By: Rolm Baptise M.D.   On: 12/23/2020 12:54   US RENAL  Result Date: 12/24/2020 CLINICAL DATA:  80 year old male with acute renal insufficiency. EXAM: RENAL / URINARY TRACT ULTRASOUND COMPLETE COMPARISON:  Abdomen ultrasound 12/01/2017. CT Abdomen and Pelvis 03/20/2013. FINDINGS: Right Kidney: Renal measurements: 11.8 x 5.3 x 7.5 cm = volume: 247 mL. Cortical echogenicity within normal limits. No hydronephrosis. Extrarenal pelvis is stable (normal variant). No right renal mass. Left Kidney: Renal measurements: 12.9 x 5.6 x 5.9 cm = volume: 221 mL. Cortical echogenicity within normal limits. Lower pole curvilinear hypoechogenicity on image 29 probably corresponds to chronic perinephric stranding seen in 2014. No left hydronephrosis today. Possible punctate chronic lower pole calculus on image 38, stable since 2014. Bladder: Decompressed and poorly visible, reportedly a Foley catheter is in place. Other: Echogenic liver on image 2. IMPRESSION: 1. No hydronephrosis or acute renal finding. Chronic left lower pole nephrolithiasis. 2. Evidence of fatty liver disease. Electronically Signed   By: Genevie Ann M.D.   On: 12/24/2020  11:18   DG Chest Port 1 View  Result Date: 12/29/2020 CLINICAL DATA:  Status post dialysis catheter placement. EXAM: PORTABLE CHEST 1 VIEW COMPARISON:  Single-view of the chest 12/23/2020. FINDINGS: Left IJ approach dialysis catheter is in place with the tip projecting in the lower superior vena cava. No pneumothorax. Elevation of the right hemidiaphragm is unchanged. Lungs are clear. Heart size is upper normal. No acute or focal bony abnormality. IMPRESSION: Dialysis catheter tip projects in the lower superior vena cava. Negative for pneumothorax or acute disease. Electronically Signed   By: Inge Rise M.D.   On: 12/29/2020 11:42   DG Chest Portable 1 View  Result Date: 12/23/2020 CLINICAL DATA:  Weakness. COVID 2 weeks ago. EXAM: PORTABLE CHEST 1 VIEW COMPARISON:  Two-view chest x-ray 12/03/2018 FINDINGS: Heart is chronically enlarged. Chronic elevation of the right hemidiaphragm noted. No edema or effusion is present. No focal airspace disease is present. Axial skeleton is unremarkable. IMPRESSION: No acute cardiopulmonary disease or significant interval change. Stable cardiomegaly without failure. Electronically Signed   By: San Morelle M.D.   On: 12/23/2020 12:33   ECHOCARDIOGRAM COMPLETE  Result Date: 12/31/2020    ECHOCARDIOGRAM REPORT  Patient Name:   Mark HIPPLER Sr. Date of Exam: 12/31/2020 Medical Rec #:  [id number]            Height:       68.5 in Accession #:    [id number]           Weight:       208.6 lb Date of Birth:  April 30, 1941            BSA:          2.092 m Patient Age:    61 years             BP:           146/63 mmHg Patient Gender: M                    HR:           84 bpm. Exam Location:  [facility] Procedure: 2D Echo, Cardiac Doppler and Color Doppler Indications:    Atrial Fibrillation I48.91  History:        Patient has prior history of Echocardiogram examinations, most                 recent 12/12/2011. Risk Factors:Hypertension, Diabetes and                  Dyslipidemia.  Sonographer:    Alvino Chapel RCS Referring Phys: [place]  1. Left ventricular ejection fraction, by estimation, is 60 to 65%. The left ventricle has normal function. The left ventricle has no regional wall motion abnormalities. There is mild left ventricular hypertrophy. Left ventricular diastolic parameters are consistent with Grade I diastolic dysfunction (impaired relaxation).  2. Right ventricular systolic function is normal. The right ventricular size is normal. There is normal pulmonary artery systolic pressure.  3. The mitral valve is normal in structure. No evidence of mitral valve regurgitation. No evidence of mitral stenosis.  4. The aortic valve is tricuspid. Aortic valve regurgitation is not visualized. Mild aortic valve sclerosis is present, with no evidence of aortic valve stenosis.  5. Aortic dilatation noted. There is mild dilatation of the aortic root, measuring 39 mm.  6. The inferior vena cava is normal in size with greater than 50% respiratory variability, suggesting right atrial pressure of 3 mmHg. FINDINGS  Left Ventricle: Left ventricular ejection fraction, by estimation, is 60 to 65%. The left ventricle has normal function. The left ventricle has no regional wall motion abnormalities. The left ventricular internal cavity size was normal in size. There is  mild left ventricular hypertrophy. Left ventricular diastolic parameters are consistent with Grade I diastolic dysfunction (impaired relaxation). Right Ventricle: The right ventricular size is normal. Right ventricular systolic function is normal. There is normal pulmonary artery systolic pressure. The tricuspid regurgitant velocity is 2.82 m/s, and with an assumed right atrial pressure of 3 mmHg,  the estimated right ventricular systolic pressure is 68.3 mmHg. Left Atrium: Left atrial size was normal in size. Right Atrium: Right atrial size was normal in size. Pericardium: There is no evidence of  pericardial effusion. Mitral Valve: The mitral valve is normal in structure. Mild mitral annular calcification. No evidence of mitral valve regurgitation. No evidence of mitral valve stenosis. Tricuspid Valve: The tricuspid valve is normal in structure. Tricuspid valve regurgitation is mild . No evidence of tricuspid stenosis. Aortic Valve: The aortic valve is tricuspid. Aortic valve regurgitation is not visualized. Mild aortic valve sclerosis is present, with no  evidence of aortic valve stenosis. Pulmonic Valve: The pulmonic valve was normal in structure. Pulmonic valve regurgitation is not visualized. No evidence of pulmonic stenosis. Aorta: Aortic dilatation noted. There is mild dilatation of the aortic root, measuring 39 mm. Venous: The inferior vena cava is normal in size with greater than 50% respiratory variability, suggesting right atrial pressure of 3 mmHg.  LEFT VENTRICLE PLAX 2D LVIDd:         4.80 cm  Diastology LVIDs:         3.30 cm  LV e' medial:    5.66 cm/s LV PW:         1.10 cm  LV E/e' medial:  17.0 LV IVS:        1.50 cm  LV e' lateral:   8.81 cm/s LVOT diam:     2.10 cm  LV E/e' lateral: 10.9 LV SV:         85 LV SV Index:   40 LVOT Area:     3.46 cm  RIGHT VENTRICLE RV S prime:     12.00 cm/s TAPSE (M-mode): 2.1 cm LEFT ATRIUM             Index       RIGHT ATRIUM           Index LA diam:        3.00 cm 1.43 cm/m  RA Area:     16.10 cm LA Vol (A2C):   50.1 ml 23.95 ml/m RA Volume:   43.20 ml  20.65 ml/m LA Vol (A4C):   60.2 ml 28.78 ml/m LA Biplane Vol: 56.1 ml 26.82 ml/m  AORTIC VALVE LVOT Vmax:   110.00 cm/s LVOT Vmean:  79.300 cm/s LVOT VTI:    0.244 m  AORTA Ao Root diam: 3.90 cm MITRAL VALVE                TRICUSPID VALVE MV Area (PHT): 4.08 cm     TR Peak grad:   31.8 mmHg MV Decel Time: 186 msec     TR Vmax:        282.00 cm/s MV E velocity: 96.00 cm/s MV A velocity: 117.00 cm/s  SHUNTS MV E/A ratio:  0.82         Systemic VTI:  0.24 m                             Systemic Diam:  2.10 cm Kirk Ruths MD Electronically signed by Kirk Ruths MD Signature Date/Time: 12/31/2020/10:23:47 AM    Final     Orson Eva, DO  [facility]  If 7PM-7AM, please contact night-coverage [email or web address] Password [email or web address] 01/03/2021, 5:12 PM   LOS: 11 days

## 2021-01-03 NOTE — NC FL2 (Signed)
[place] LEVEL OF CARE SCREENING TOOL     IDENTIFICATION  Patient Name: [name]. Birthdate: November 14, 1940 Sex: male Admission Date (Current Location): 12/23/2020  [facility] [place] Number:  [employer] and Address:  [place] [address], [place]      Provider Number: [id number]  Attending Physician Name and Address:  Orson Eva, MD  Relative Name and Phone Number:  Gohan, Collister (Spouse)   [phone] [facility])    Current Level of Care: Hospital Recommended Level of Care: [place] Prior Approval Number:    Date Approved/Denied:   PASRR Number: [id number]  Discharge Plan: SNF    Current Diagnoses: Patient Active Problem List   Diagnosis Date Noted  . AKI (acute kidney injury) ([place]) 12/23/2020  . Hyperkalemia 12/02/2018  . Fever, unknown origin 12/02/2018  . Altered mental state 12/02/2018  . Acute on chronic renal failure ([place]) 12/02/2018  . Pancreatic insufficiency 12/02/2018  . Wears hearing aid 12/02/2018  . HOH (hard of hearing) 12/02/2018  . DM type 2 with diabetic peripheral neuropathy ([place]) 09/15/2018  . Uncontrolled type 2 diabetes mellitus with hyperglycemia ([place]) 09/15/2018  . Peripheral edema 06/25/2018  . NAFLD (nonalcoholic fatty liver disease)   . Diabetic retinopathy ([place])   . ED (erectile dysfunction) 01/05/2014  . Shortness of breath 12/05/2011  . Coronary atherosclerosis of native coronary artery 12/05/2011  . Type 2 diabetes mellitus ([place]) 12/05/2011  . Essential hypertension, benign 12/05/2011  . Mixed hyperlipidemia 12/05/2011  . Malignant neoplasm of prostate ([place]) 06/26/2011    Orientation RESPIRATION BLADDER Height & Weight     Self,Time,Situation,Place  Normal Incontinent Weight: 209 lb 3.5 oz (94.9 kg) Height:     BEHAVIORAL SYMPTOMS/MOOD NEUROLOGICAL BOWEL NUTRITION STATUS      Incontinent Diet (heart healthy/carb modified)  AMBULATORY STATUS  COMMUNICATION OF NEEDS Skin   Extensive Assist Verbally Normal                       Personal Care Assistance Level of Assistance  Bathing,Feeding,Dressing Bathing Assistance: Limited assistance Feeding assistance: Independent Dressing Assistance: Limited assistance     Functional Limitations Info  Sight,Hearing,Speech Sight Info: Adequate Hearing Info: Adequate Speech Info: Adequate    SPECIAL CARE FACTORS FREQUENCY  PT (By licensed PT)     PT Frequency: 5x/week              Contractures Contractures Info: Not present    Additional Factors Info  Code Status,Allergies,Isolation Precautions Code Status Info: Full code Allergies Info: Enalapril Maleate     Isolation Precautions Info: COVID+ 12/23/20     Current Medications (01/03/2021):  This is the current hospital active medication list Current Facility-Administered Medications  Medication Dose Route Frequency Provider Last Rate Last Admin  . 0.9 %  sodium chloride infusion  100 mL Intravenous PRN Donato Heinz, MD      . 0.9 %  sodium chloride infusion  100 mL Intravenous PRN Donato Heinz, MD      . acetaminophen (TYLENOL) tablet 650 mg  650 mg Oral Q6H PRN Barton Dubois, MD       Or  . acetaminophen (TYLENOL) suppository 650 mg  650 mg Rectal Q6H PRN Barton Dubois, MD      . alteplase (CATHFLO ACTIVASE) injection 2 mg  2 mg Intracatheter Once PRN Donato Heinz, MD      . calcium carbonate (TUMS - dosed in mg elemental calcium) chewable tablet 800  mg of elemental calcium  800 mg of elemental calcium Oral TID WC Barton Dubois, MD   800 mg of elemental calcium at 01/03/21 1139  . Chlorhexidine Gluconate Cloth 2 % PADS 6 each  6 each Topical Daily Barton Dubois, MD   6 each at 01/03/21 [phone]  . Chlorhexidine Gluconate Cloth 2 % PADS 6 each  6 each Topical Q0600 Donato Heinz, MD   6 each at 01/03/21 0549  . cholecalciferol (VITAMIN D3) tablet 2,000 Units  2,000 Units Oral Daily Rosita Fire, MD   2,000 Units at 01/03/21 [phone]  . Darbepoetin Alfa (ARANESP) injection 100 mcg  100 mcg Subcutaneous Q Tue-1800 Barton Dubois, MD   100 mcg at 01/02/21 1809  . dextrose 50 % solution 0-50 mL  0-50 mL Intravenous PRN Barton Dubois, MD      . heparin injection 1,000 Units  1,000 Units Dialysis PRN Donato Heinz, MD      . insulin aspart (novoLOG) injection 0-5 Units  0-5 Units Subcutaneous QHS Barton Dubois, MD   2 Units at 01/02/21 2100  . insulin aspart (novoLOG) injection 0-9 Units  0-9 Units Subcutaneous TID WC Barton Dubois, MD   3 Units at 01/03/21 1140  . insulin regular, human (MYXREDLIN) 100 units/ 100 mL infusion   Intravenous Continuous Barton Dubois, MD   Stopped at 12/24/20 2100  . lipase/protease/amylase (CREON) capsule 12,000 Units  12,000 Units Oral TID Maebelle Munroe, MD   12,000 Units at 01/03/21 1139  . MEDLINE mouth rinse  15 mL Mouth Rinse BID Barton Dubois, MD   15 mL at 01/03/21 0814  . metoprolol tartrate (LOPRESSOR) tablet 50 mg  50 mg Oral BID Barton Dubois, MD   50 mg at 01/03/21 0813  . ondansetron (ZOFRAN) tablet 4 mg  4 mg Oral Q6H PRN Barton Dubois, MD       Or  . ondansetron [facility]) injection 4 mg  4 mg Intravenous Q6H PRN Barton Dubois, MD      . saccharomyces boulardii (FLORASTOR) capsule 250 mg  250 mg Oral BID Barton Dubois, MD   250 mg at 01/03/21 0813  . sodium bicarbonate tablet 1,300 mg  1,300 mg Oral BID Barton Dubois, MD   1,300 mg at 01/03/21 3235     Discharge Medications: Please see discharge summary for a list of discharge medications.  Relevant Imaging Results:  Relevant Lab Results:   Additional Information SSN [id number]  Ihor Gully, LCSW

## 2021-01-03 NOTE — Progress Notes (Signed)
Inpatient Diabetes Program Recommendations  AACE/ADA: New Consensus Statement on Inpatient Glycemic Control (2015)  Target Ranges:  Prepandial:   less than 140 mg/dL      Peak postprandial:   less than 180 mg/dL (1-2 hours)      Critically ill patients:  140 - 180 mg/dL   Lab Results  Component Value Date   GLUCAP 242 (H) 01/03/2021   HGBA1C 7.0 (H) 12/23/2020    Review of Glycemic Control  Diabetes history: DM2 Outpatient Diabetes medications: Novolin R 8 units TID before meals Current orders for Inpatient glycemic control: Novolog 0-9 units TID with meals and 0-5 HS  HgbA1C - 7.0% Post-prandials elevated. May benefit from addition of mc insulin  Inpatient Diabetes Program Recommendations:     Add Novolog 4 units TID with meals if eating > 50% meal  Continue to follow.   Thank you. Lorenda Peck, RD, LDN, CDE Inpatient Diabetes Coordinator [phone]

## 2021-01-03 NOTE — Progress Notes (Signed)
[facility] ROUNDING NOTE   Subjective:   Brief history: This is a 80 year old gentleman history of diabetes mellitus type 2 stage III chronic kidney disease hypertension hyperlipidemia diastolic heart failure coronary artery disease.  He was admitted 12/23/2020 with altered mental status and acute kidney injury presumably due to ischemic ATN in setting of volume depletion and hypotension and acute illness due to COVID-19 pneumonia.  He required dialysis treatments 12/29/2020 and 12/30/2020.  Dialysis has been held with hopes of recovery of renal function.  He underwent placement of dialysis catheter by Dr. Constance Haw 12/28/2020  Baseline creatinine about 2 mg/dL per record  Blood pressure 149/70 pulse of 86 temperature 98 O2 sats 96% room air.  Urine output 1000 cc 01/02/2021  Labs pending 01/03/2021 last creatinine 4.27.  Last dialysis 12/30/2020  Objective:  Vital signs in last 24 hours:  Temp:  [98 F (36.7 C)-98.9 F (37.2 C)] 98 F (36.7 C) (06/01 0619) Pulse Rate:  [86-91] 86 (06/01 0619) Resp:  [18-19] 18 (06/01 0619) BP: (142-149)/(70-80) 149/72 (06/01 0619) SpO2:  [96 %-98 %] 96 % (06/01 0619) Weight:  [94.9 kg] 94.9 kg (06/01 0331)  Weight change: 0.1 kg Filed Weights   01/01/21 0500 01/02/21 0500 01/03/21 0331  Weight: 94.7 kg 94.8 kg 94.9 kg    Intake/Output: I/O last 3 completed shifts: In: 2770.2 [P.O.:720; I.V.:2021.6; IV Piggyback:28.6] Out: 1150 [Urine:1150]   Intake/Output this shift:  Total I/O In: 1290 [P.O.:240; I.V.:1050] Out: 400 [Urine:400]  General:NAD, alert awake, able to lie flat comfortable Heart:RRR, s1s2 nl Lungs:clear b/l, no crackle Abdomen:soft, Non-tender, non-distended Extremities:No edema Dialysis Access: Temporary HD catheter  Basic Metabolic Panel: Recent Labs  Lab 12/29/20 0414 12/30/20 0448 12/31/20 0543 01/01/21 0638 01/02/21 0549  NA 142 139 137 137 137  K 3.4* 3.2* 3.3* 3.5 3.5  CL 113* 107 102 103 103  CO2  14* 21* 24 25 23   GLUCOSE 207* 206* 189* 236* 188*  BUN 142* 101* 73* 82* 86*  CREATININE 5.92* 4.49* 3.54* 4.00* 4.27*  CALCIUM 6.8* 6.6* 6.5* 6.4* 6.5*  PHOS 8.8* 5.4* 4.5 4.7* 4.6    Liver Function Tests: Recent Labs  Lab 12/29/20 0414 12/30/20 0448 12/31/20 0543 01/01/21 0638 01/02/21 0549  ALBUMIN 2.3* 2.0* 1.8* 1.8* 1.8*   No results for input(s): LIPASE, AMYLASE in the last 168 hours. No results for input(s): AMMONIA in the last 168 hours.  CBC: Recent Labs  Lab 12/27/20 0910 12/29/20 2014  WBC 10.4 16.4*  HGB 9.6* 8.8*  HCT 30.1* 26.2*  MCV 95.9 92.3  PLT 151 186    Cardiac Enzymes: No results for input(s): CKTOTAL, CKMB, CKMBINDEX, TROPONINI in the last 168 hours.  BNP: Invalid input(s): POCBNP  CBG: Recent Labs  Lab 01/01/21 2044 01/02/21 0738 01/02/21 1034 01/02/21 1606 01/02/21 1949  GLUCAP 191* 174* 260* 226* 226*    Microbiology: Results for orders placed or performed during the hospital encounter of 12/23/20  Resp Panel by RT-PCR (Flu A&B, Covid) Nasopharyngeal Swab     Status: Abnormal   Collection Time: 12/23/20 12:06 PM   Specimen: Nasopharyngeal Swab; Nasopharyngeal(NP) swabs in vial transport medium  Result Value Ref Range Status   SARS Coronavirus 2 by RT PCR POSITIVE (A) NEGATIVE Final    Comment: CRITICAL RESULT CALLED TO, READ BACK BY AND VERIFIED WITH: OAKLEY,B 1323 12/23/2020 COLEMAN,R (NOTE) SARS-CoV-2 target nucleic acids are DETECTED.  The SARS-CoV-2 RNA is generally detectable in upper respiratory specimens during the acute phase of infection. Positive results  are indicative of the presence of the identified virus, but do not rule out bacterial infection or co-infection with other pathogens not detected by the test. Clinical correlation with patient history and other diagnostic information is necessary to determine patient infection status. The expected result is Negative.  Fact Sheet for  Patients: EntrepreneurPulse.com.au  Fact Sheet for Healthcare Providers: IncredibleEmployment.be  This test is not yet approved or cleared by the [place] FDA and  has been authorized for detection and/or diagnosis of SARS-CoV-2 by FDA under an Emergency Use Authorization (EUA).  This EUA will remain in effect (meaning this t est can be used) for the duration of  the COVID-19 declaration under Section 564(b)(1) of the Act, 21 U.S.C. section 360bbb-3(b)(1), unless the authorization is terminated or revoked sooner.     Influenza A by PCR NEGATIVE NEGATIVE Final   Influenza B by PCR NEGATIVE NEGATIVE Final    Comment: (NOTE) The Xpert Xpress SARS-CoV-2/FLU/RSV plus assay is intended as an aid in the diagnosis of influenza from Nasopharyngeal swab specimens and should not be used as a sole basis for treatment. Nasal washings and aspirates are unacceptable for Xpert Xpress SARS-CoV-2/FLU/RSV testing.  Fact Sheet for Patients: EntrepreneurPulse.com.au  Fact Sheet for Healthcare Providers: IncredibleEmployment.be  This test is not yet approved or cleared by the [place] FDA and has been authorized for detection and/or diagnosis of SARS-CoV-2 by FDA under an Emergency Use Authorization (EUA). This EUA will remain in effect (meaning this test can be used) for the duration of the COVID-19 declaration under Section 564(b)(1) of the Act, 21 U.S.C. section 360bbb-3(b)(1), unless the authorization is terminated or revoked.  Performed at [facility], [address], [place], [place] [address]   Urine culture     Status: Abnormal   Collection Time: 12/23/20  6:28 PM   Specimen: Urine, Clean Catch  Result Value Ref Range Status   Specimen Description   Final    URINE, CLEAN CATCH Performed at [facility], [address], [place], [place] [address]    Special Requests   Final    NONE Performed at [facility], [address] [place], [place] [address]    Culture (A)  Final    <10,000 COLONIES/mL INSIGNIFICANT GROWTH Performed at [facility], [place] [address] [place], [place] [address]    Report Status 12/25/2020 FINAL  Final  MRSA PCR Screening     Status: None   Collection Time: 12/23/20  9:59 PM   Specimen: Nasal Mucosa; Nasopharyngeal  Result Value Ref Range Status   MRSA by PCR NEGATIVE NEGATIVE Final    Comment:        The GeneXpert MRSA Assay (FDA approved for NASAL specimens only), is one component of a comprehensive MRSA colonization surveillance program. It is not intended to diagnose MRSA infection nor to guide or monitor treatment for MRSA infections. Performed at [facility], [address] [place], [place] [address]     Coagulation Studies: No results for input(s): LABPROT, INR in the last 72 hours.  Urinalysis: No results for input(s): COLORURINE, LABSPEC, PHURINE, GLUCOSEU, HGBUR, BILIRUBINUR, KETONESUR, PROTEINUR, UROBILINOGEN, NITRITE, LEUKOCYTESUR in the last 72 hours.  Invalid input(s): APPERANCEUR    Imaging: No results found.   Medications:   . sodium chloride    . sodium chloride    . insulin Stopped (12/24/20 2100)  . lactated ringers 100 mL/hr at 01/03/21 0518   . calcium carbonate  800 mg of elemental calcium Oral TID WC  .  Chlorhexidine Gluconate Cloth  6 each Topical Daily  . Chlorhexidine Gluconate Cloth  6 each Topical Q0600  . cholecalciferol  2,000 Units Oral Daily  . darbepoetin (ARANESP) injection - NON-DIALYSIS  100 mcg Subcutaneous Q Tue-1800  . insulin aspart  0-5 Units Subcutaneous QHS  . insulin aspart  0-9 Units Subcutaneous TID WC  . lipase/protease/amylase  12,000 Units Oral TID AC  . mouth rinse  15 mL Mouth Rinse BID  . metoprolol tartrate  50 mg Oral BID  . saccharomyces boulardii  250 mg Oral BID  . sodium bicarbonate  1,300 mg Oral BID   sodium chloride, sodium chloride, acetaminophen **OR**  acetaminophen, alteplase, dextrose, heparin, ondansetron **OR** ondansetron (ZOFRAN) IV  Assessment/ Plan:  #Acute kidney injury on CKD 3b, nonoliguric presumably due to ischemic ATN in the setting of volume depletion/hypotension and acute illness due to COVID-19 pneumonia.  He has received 2 dialysis session because of azotemia/uremia with improvement of mental status.  The BUN and creatinine level trending up off dialysis however the urine output is increasing. Plan to hold off dialysis today and watch out for renal recovery.  We will do strict ins and outs, daily lab monitoring.    Would recommend continuation of IV fluids at this point.  At least until patient is eating and drinking well.  #Acute metabolic encephalopathy: Multifactorial etiology including COVID-pneumonia, azotemia/uremia.  The mental status seems to be improving after dialysis.  Continue to watch.  Still looks very weak and deconditioned.  He will benefit from PT OT therapy.  # Anemia of acute illness: Monitor hemoglobin.  Received ESA.  Transfuse as needed.  He is COVID-positive.  #Secondary hyperparathyroidism.hypocalcemia: Vitamin D level 25.2, PTH 298.  Continue calcium carbonate and is started oral vitamin D.  # HTN/volume: Blood pressure acceptable.  Currently on metoprolol.  Continue monitor.    LOS: [place] @TODAY @6 :36 AM

## 2021-01-03 NOTE — TOC Initial Note (Signed)
Transition of Care (TOC) - Initial/Assessment Note    Patient Details  Name: Mark OSORTO Sr. MRN: [id number] Date of Birth: 07-20-1941  Transition of Care [facility]) CM/SW Contact:    Mark Gully, LCSW Phone Number: 01/03/2021, 4:02 PM  Clinical Narrative:                 Patient from home with spouse. At baseline, ambulates with a cane and is independent. Has had both COVID vaccinations and booster shot. Wife agreeable to SNF. Discussed choices, referrals sent.    Expected Discharge Plan: Skilled Nursing Facility Barriers to Discharge: Continued Medical Work up   Patient Goals and CMS Choice Patient states their goals for this hospitalization and ongoing recovery are:: rehab and home   Choice offered to / list presented to : Spouse  Expected Discharge Plan and Services Expected Discharge Plan: [place] Acute Care Choice: [place] Living arrangements for the past 2 months: Single Family Home                                      Prior Living Arrangements/Services Living arrangements for the past 2 months: Single Family Home Lives with:: Spouse Patient language and need for interpreter reviewed:: Yes Do you feel safe going back to the place where you live?: Yes      Need for Family Participation in Patient Care: Yes (Comment) Care giver support system in place?: Yes (comment)   Criminal Activity/Legal Involvement Pertinent to Current Situation/Hospitalization: No - Comment as needed  Activities of Daily Living Home Assistive Devices/Equipment: None ADL Screening (condition at time of admission) Patient's cognitive ability adequate to safely complete daily activities?: No Is the patient deaf or have difficulty hearing?: Yes Does the patient have difficulty seeing, even when wearing glasses/contacts?: Yes Does the patient have difficulty concentrating, remembering, or making decisions?: Yes Patient able to express  need for assistance with ADLs?: Yes Does the patient have difficulty dressing or bathing?: Yes Independently performs ADLs?: No Communication: Independent Dressing (OT): Needs assistance Is this a change from baseline?: Pre-admission baseline Grooming: Needs assistance Is this a change from baseline?: Pre-admission baseline Feeding: Independent Bathing: Needs assistance Is this a change from baseline?: Pre-admission baseline Toileting: Needs assistance Is this a change from baseline?: Pre-admission baseline In/Out Bed: Needs assistance Is this a change from baseline?: Pre-admission baseline Walks in Home: Needs assistance Is this a change from baseline?: Pre-admission baseline Does the patient have difficulty walking or climbing stairs?: Yes Weakness of Legs: Both Weakness of Arms/Hands: Both  Permission Sought/Granted Permission sought to share information with : Family Supports    Share Information with NAME: [name] wife           Emotional Assessment     Affect (typically observed): Appropriate Orientation: : Oriented to Self,Oriented to Place,Oriented to  Time,Oriented to Situation Alcohol / Substance Use: Not Applicable Psych Involvement: No (comment)  Admission diagnosis:  Metabolic acidosis [M09.4] AKI (acute kidney injury) ([place]) [N17.9] Patient Active Problem List   Diagnosis Date Noted  . AKI (acute kidney injury) ([place]) 12/23/2020  . Hyperkalemia 12/02/2018  . Fever, unknown origin 12/02/2018  . Altered mental state 12/02/2018  . Acute on chronic renal failure ([place]) 12/02/2018  . Pancreatic insufficiency 12/02/2018  . Wears hearing aid 12/02/2018  . HOH (hard of hearing) 12/02/2018  . DM type 2 with diabetic  peripheral neuropathy ([place]) 09/15/2018  . Uncontrolled type 2 diabetes mellitus with hyperglycemia ([place]) 09/15/2018  . Peripheral edema 06/25/2018  . NAFLD (nonalcoholic fatty liver disease)   . Diabetic retinopathy ([place])   . ED (erectile  dysfunction) 01/05/2014  . Shortness of breath 12/05/2011  . Coronary atherosclerosis of native coronary artery 12/05/2011  . Type 2 diabetes mellitus ([place]) 12/05/2011  . Essential hypertension, benign 12/05/2011  . Mixed hyperlipidemia 12/05/2011  . Malignant neoplasm of prostate ([place]) 06/26/2011   PCP:  Mark Frizzle, MD Pharmacy:   [facility], [address] AT [place]. [place] [address] Phone: [phone] Fax: [phone]     Social Determinants of Health (SDOH) Interventions    Readmission Risk Interventions Readmission Risk Prevention Plan 12/04/2018  Transportation Screening Complete  PCP or Specialist Appt within 3-5 Days (No Data)  [place] or [place] Complete  Social Work Consult for [place] Planning/Counseling Complete  Palliative Care Screening Not Applicable  Medication Review Press photographer) Complete  Some recent data might be hidden

## 2021-01-04 LAB — VITAMIN B12: Vitamin B-12: 634 pg/mL (ref 180–914)

## 2021-01-04 LAB — RENAL FUNCTION PANEL
Albumin: 1.7 g/dL — ABNORMAL LOW (ref 3.5–5.0)
Anion gap: 12 (ref 5–15)
BUN: 89 mg/dL — ABNORMAL HIGH (ref 8–23)
CO2: 24 mmol/L (ref 22–32)
Calcium: 6.6 mg/dL — ABNORMAL LOW (ref 8.9–10.3)
Chloride: 103 mmol/L (ref 98–111)
Creatinine, Ser: 4.64 mg/dL — ABNORMAL HIGH (ref 0.61–1.24)
GFR, Estimated: 12 mL/min — ABNORMAL LOW (ref >60)
Glucose, Bld: 190 mg/dL — ABNORMAL HIGH (ref 70–99)
Phosphorus: 4.4 mg/dL (ref 2.5–4.6)
Potassium: 3.8 mmol/L (ref 3.5–5.1)
Sodium: 139 mmol/L (ref 135–145)

## 2021-01-04 LAB — GLUCOSE, CAPILLARY
Glucose-Capillary: 185 mg/dL — ABNORMAL HIGH (ref 70–99)
Glucose-Capillary: 248 mg/dL — ABNORMAL HIGH (ref 70–99)
Glucose-Capillary: 242 mg/dL — ABNORMAL HIGH (ref 70–99)

## 2021-01-04 LAB — FERRITIN: Ferritin: 131 ng/mL (ref 24–336)

## 2021-01-04 LAB — TSH: TSH: 1.477 u[IU]/mL (ref 0.350–4.500)

## 2021-01-04 LAB — C-REACTIVE PROTEIN: CRP: 12.4 mg/dL — ABNORMAL HIGH (ref <1.0)

## 2021-01-04 LAB — FOLATE: Folate: 24.5 ng/mL (ref >5.9)

## 2021-01-04 LAB — T4, FREE: Free T4: 0.93 ng/dL (ref 0.61–1.12)

## 2021-01-04 MED ORDER — INSULIN ASPART 100 UNIT/ML IJ SOLN
3.0000 [IU] | Freq: Three times a day (TID) | INTRAMUSCULAR | Status: DC
  Administered 2021-01-05 – 2021-01-12 (×19): 3 [IU] via SUBCUTANEOUS

## 2021-01-04 NOTE — Progress Notes (Signed)
[facility] ROUNDING NOTE   Subjective:   Brief history: This is a 80 year old gentleman history of diabetes mellitus type 2 stage III chronic kidney disease hypertension hyperlipidemia diastolic heart failure coronary artery disease.  He was admitted 12/23/2020 with altered mental status and acute kidney injury presumably due to ischemic ATN in setting of volume depletion and hypotension and acute illness due to COVID-19 pneumonia.  He required dialysis treatments 12/29/2020 and 12/30/2020.  Dialysis has been held with hopes of recovery of renal function.  He underwent placement of dialysis catheter by Dr. Constance Haw 12/28/2020  Baseline creatinine about 2 mg/dL per record  Blood pressure 145/78 pulse 93 temperature 98.9 O2 sats 94% room air urine output 1550 cc 01/03/2021  Last serum creatinine 01/03/2021 4.41 sodium 137 potassium 3.6 CO2 24 glucose 170     last dialysis 12/30/2020.  Objective:  Vital signs in last 24 hours:  Temp:  [98.2 F (36.8 C)-99.6 F (37.6 C)] 98.9 F (37.2 C) (06/02 0422) Pulse Rate:  [88-115] 93 (06/02 0422) Resp:  [15-19] 18 (06/02 0422) BP: (142-156)/(72-82) 145/78 (06/02 0422) SpO2:  [94 %-97 %] 94 % (06/02 0422) Weight:  [95 kg] 95 kg (06/02 0500)  Weight change: 0.1 kg Filed Weights   01/02/21 0500 01/03/21 0331 01/04/21 0500  Weight: 94.8 kg 94.9 kg 95 kg    Intake/Output: I/O last 3 completed shifts: In: 2350.6 [P.O.:750; I.V.:1600.6] Out: 2150 [Urine:2150]   Intake/Output this shift:  Total I/O In: -  Out: 1000 [Urine:1000]  General:NAD, alert awake, able to lie flat comfortable Heart:RRR, s1s2 nl Lungs:clear b/l, no crackle Abdomen:soft, Non-tender, non-distended Extremities:No edema Dialysis Access: Temporary HD catheter  Basic Metabolic Panel: Recent Labs  Lab 12/30/20 0448 12/31/20 0543 01/01/21 2563 01/02/21 0549 01/03/21 0653  NA 139 137 137 137 137  K 3.2* 3.3* 3.5 3.5 3.6  CL 107 102 103 103 103  CO2 21* 24 25  23 24   GLUCOSE 206* 189* 236* 188* 170*  BUN 101* 73* 82* 86* 87*  CREATININE 4.49* 3.54* 4.00* 4.27* 4.41*  CALCIUM 6.6* 6.5* 6.4* 6.5* 6.4*  PHOS 5.4* 4.5 4.7* 4.6 4.4    Liver Function Tests: Recent Labs  Lab 12/30/20 0448 12/31/20 0543 01/01/21 8937 01/02/21 0549 01/03/21 0653  ALBUMIN 2.0* 1.8* 1.8* 1.8* 1.8*   No results for input(s): LIPASE, AMYLASE in the last 168 hours. No results for input(s): AMMONIA in the last 168 hours.  CBC: Recent Labs  Lab 12/29/20 2014  WBC 16.4*  HGB 8.8*  HCT 26.2*  MCV 92.3  PLT 186    Cardiac Enzymes: No results for input(s): CKTOTAL, CKMB, CKMBINDEX, TROPONINI in the last 168 hours.  BNP: Invalid input(s): POCBNP  CBG: Recent Labs  Lab 01/02/21 1949 01/03/21 0729 01/03/21 1105 01/03/21 1654 01/03/21 2021  GLUCAP 226* 157* 242* 283* 217*    Microbiology: Results for orders placed or performed during the hospital encounter of 12/23/20  Resp Panel by RT-PCR (Flu A&B, Covid) Nasopharyngeal Swab     Status: Abnormal   Collection Time: 12/23/20 12:06 PM   Specimen: Nasopharyngeal Swab; Nasopharyngeal(NP) swabs in vial transport medium  Result Value Ref Range Status   SARS Coronavirus 2 by RT PCR POSITIVE (A) NEGATIVE Final    Comment: CRITICAL RESULT CALLED TO, READ BACK BY AND VERIFIED WITH: OAKLEY,B 1323 12/23/2020 COLEMAN,R (NOTE) SARS-CoV-2 target nucleic acids are DETECTED.  The SARS-CoV-2 RNA is generally detectable in upper respiratory specimens during the acute phase of infection. Positive results are indicative  of the presence of the identified virus, but do not rule out bacterial infection or co-infection with other pathogens not detected by the test. Clinical correlation with patient history and other diagnostic information is necessary to determine patient infection status. The expected result is Negative.  Fact Sheet for Patients: EntrepreneurPulse.com.au  Fact Sheet for Healthcare  Providers: IncredibleEmployment.be  This test is not yet approved or cleared by the [place] FDA and  has been authorized for detection and/or diagnosis of SARS-CoV-2 by FDA under an Emergency Use Authorization (EUA).  This EUA will remain in effect (meaning this t est can be used) for the duration of  the COVID-19 declaration under Section 564(b)(1) of the Act, 21 U.S.C. section 360bbb-3(b)(1), unless the authorization is terminated or revoked sooner.     Influenza A by PCR NEGATIVE NEGATIVE Final   Influenza B by PCR NEGATIVE NEGATIVE Final    Comment: (NOTE) The Xpert Xpress SARS-CoV-2/FLU/RSV plus assay is intended as an aid in the diagnosis of influenza from Nasopharyngeal swab specimens and should not be used as a sole basis for treatment. Nasal washings and aspirates are unacceptable for Xpert Xpress SARS-CoV-2/FLU/RSV testing.  Fact Sheet for Patients: EntrepreneurPulse.com.au  Fact Sheet for Healthcare Providers: IncredibleEmployment.be  This test is not yet approved or cleared by the [place] FDA and has been authorized for detection and/or diagnosis of SARS-CoV-2 by FDA under an Emergency Use Authorization (EUA). This EUA will remain in effect (meaning this test can be used) for the duration of the COVID-19 declaration under Section 564(b)(1) of the Act, 21 U.S.C. section 360bbb-3(b)(1), unless the authorization is terminated or revoked.  Performed at [facility], [address] [place], [place] [address]   Urine culture     Status: Abnormal   Collection Time: 12/23/20  6:28 PM   Specimen: Urine, Clean Catch  Result Value Ref Range Status   Specimen Description   Final    URINE, CLEAN CATCH Performed at [facility], [address] [place], [place] [address]    Special Requests   Final    NONE Performed at [facility], [address] [place], [place] [address]    Culture (A)  Final    <10,000  COLONIES/mL INSIGNIFICANT GROWTH Performed at [facility], [place] [address] [place], [place] [address]    Report Status 12/25/2020 FINAL  Final  MRSA PCR Screening     Status: None   Collection Time: 12/23/20  9:59 PM   Specimen: Nasal Mucosa; Nasopharyngeal  Result Value Ref Range Status   MRSA by PCR NEGATIVE NEGATIVE Final    Comment:        The GeneXpert MRSA Assay (FDA approved for NASAL specimens only), is one component of a comprehensive MRSA colonization surveillance program. It is not intended to diagnose MRSA infection nor to guide or monitor treatment for MRSA infections. Performed at [facility], [address], [place], [place] [address]     Coagulation Studies: No results for input(s): LABPROT, INR in the last 72 hours.  Urinalysis: No results for input(s): COLORURINE, LABSPEC, PHURINE, GLUCOSEU, HGBUR, BILIRUBINUR, KETONESUR, PROTEINUR, UROBILINOGEN, NITRITE, LEUKOCYTESUR in the last 72 hours.  Invalid input(s): APPERANCEUR    Imaging: No results found.   Medications:   . sodium chloride    . sodium chloride    . insulin Stopped (12/24/20 2100)   . aspirin EC  81 mg Oral Daily  . calcium carbonate  800 mg of elemental calcium Oral TID WC  .  Chlorhexidine Gluconate Cloth  6 each Topical Daily  . Chlorhexidine Gluconate Cloth  6 each Topical Q0600  . cholecalciferol  2,000 Units Oral Daily  . darbepoetin (ARANESP) injection - NON-DIALYSIS  100 mcg Subcutaneous Q Tue-1800  . insulin aspart  0-5 Units Subcutaneous QHS  . insulin aspart  0-9 Units Subcutaneous TID WC  . lipase/protease/amylase  12,000 Units Oral TID AC  . mouth rinse  15 mL Mouth Rinse BID  . metoprolol tartrate  50 mg Oral BID  . saccharomyces boulardii  250 mg Oral BID  . sodium bicarbonate  1,300 mg Oral BID   sodium chloride, sodium chloride, acetaminophen **OR** acetaminophen, alteplase, dextrose, heparin, ondansetron **OR** ondansetron (ZOFRAN) IV  Assessment/ Plan:   #Acute kidney injury on CKD 3b, nonoliguric presumably due to ischemic ATN in the setting of volume depletion/hypotension and acute illness due to COVID-19 pneumonia.  He has received 2 dialysis session because of azotemia/uremia with improvement of mental status.  The BUN and creatinine level trending up off dialysis however the urine output is increasing. Plan to hold off dialysis today and watch out for renal recovery.  Would not discharge until patient's creatinine is showing a downward trend. We will do strict ins and outs, daily lab monitoring.    Would recommend continuation of IV fluids at this point.  At least until patient is eating and drinking well.   #Acute metabolic encephalopathy: Multifactorial etiology including COVID-pneumonia, azotemia/uremia.  The mental status seems to be improving after dialysis.  Continue to watch.  Still looks very weak and deconditioned.  He will benefit from PT OT therapy.  # Anemia of acute illness: Monitor hemoglobin.  Received ESA.  Transfuse as needed.  He is COVID-positive.  #Secondary hyperparathyroidism.hypocalcemia: Vitamin D level 25.2, PTH 298.  Continue calcium carbonate and is started oral vitamin D.  # HTN/volume: Blood pressure acceptable.  Currently on metoprolol.  Continue monitor.    LOS: [place] @TODAY @6 :50 AM

## 2021-01-04 NOTE — Progress Notes (Signed)
PROGRESS NOTE  [name] IHK:[id number] DOB: April 18, 1941 DOA: 12/23/2020 PCP: [name], MD  Brief History:  Mark Dicksonis a 80 y.o.malewith medical history significant fortype 2 diabetes, CKD stage IIIb, hypertension, dyslipidemia, diastolic CHF, and CAD described to the ED via EMS with increasing weakness and confusion along with poor appetite for the last 2 to 3 days.Patient was admitted with AKI and severe acidosis along with hyperglycemia. He continues to have persistent AKI with uremic encephalopathy. He is also noted to be COVID-positive and has been started on remdesivir.  Patient remained stable on RA.  He received 3 days remdesivir.  Unfortunately, he remained confused and his renal function continued to deteriorate.  Nephrology was consulted.  A temporary HD catheter was place on 12/29/20 and patient was initiated on HD on 12/29/20 and 5/28.  Now, the patient is non-oliguric with close observation for renal recovery.  With HD, his mental status improved.  Assessment/Plan: Generalized weakness with acute metabolic encephalopathy  -secondary to AKI with severe metabolic acidosis and uncompensated respiratory alkalosis -Suspected to be secondary to uremic encephalopathy. -acidosis due to AKI; -mentation improving and stable; patient cooperative with examination and oriented x3..  Non-oliguricAKIon CKD3b -baseline creatinine 1.7-2.0 -suspect ischemic ATN in the setting of prerenal with dehydration, volume depletion, hypotension and acute COVID infection. -Renal ultrasoundwithout hydronephrosis -avoiding nephrotoxic agents, hypotension and the use of contrast. -General surgery placed left internal jugular dialysis catheter 12/29/2020; -First dialysis provided on 12/29/2020, overall well-tolerated. Second dialysis treatment much better tolerated on 12/30/2020.  -continue monitoring for renal recovery -Continue IVF per  nephrology  Hypocalcemia -Continue to follow electrolytes trend -Continue telemetry monitoring. -Continue Tums as per nephrology recommendation.  Hyperglycemiain setting of DM2with mild DKA -resolved -Compliant with home medications according to wife at bedside -Likely stress-induced -Hemoglobin A1c7.0% demonstrating good control. -Continue sliding scale insulin and follow CBGs fluctuation -add novolog 3 units with meals  COVID-19 infection -Chest x-ray with stable cardiomegaly and no other signs of infiltrate -Patient received 3 days of remdesivir for asymptomatic COVID-19 infection. -No need for steroids as pt stable on RA -Continue isolation precautionsand continue practicing diet 3 W's (Wait 6 feet apart, Wash hands frequently and Wear a mask)at time of discharge.  Thrombocytopenia -Overallstable; likely due to acute medical illness -Continue to monitor platelet count trend. -Continue avoiding heparin agents -B12--634 -folate--24.5 -TSH--1.477  Hypertension/CAD -continue metoprolol -no chest pain -12/31/20 Echo--EF 60-65%, no WMA, G1DD  Very Hard of hearing -Patient uses hearing aids at baseline.  A. Fib with RVR, type unspecified -No sustained and associated with hemodialysis initiation -After resumption of metoprolol and dose adjustment provided; patient has remained normal rate and sinus rhythm. -Denies palpitations, chest pain and shortness of breath. -2D echo with preserved ejection fraction, no wall motion normalities and grade 1 diastolic dysfunction. No significant valvular disorder appreciated.  Physical deconditioning -Physical therapy has seen patient and recommended to skilled nursing facility at discharge for rehabilitation.     Status is: Inpatient  Remains inpatient appropriate because:IV treatments appropriate due to intensity of illness or inability to take PO   Dispo: The patient is from: Home  Anticipated  d/c is to: SNF  Patient currently is not medically stable to d/c.              Difficult to place patient No        Family Communication:  left VM for spouse  6/2  Consultants:  renal  Code Status:  FULL   DVT Prophylaxis:  SCDs   Procedures: As Listed in Progress Note Above  Antibiotics: None    Subjective: Patient denies fevers, chills, headache, chest pain, dyspnea, nausea, vomiting, diarrhea, abdominal pain, dysuria, hematuria,    Objective: Vitals:   01/04/21 0500 01/04/21 1114 01/04/21 1402 01/04/21 1420  BP:  (!) 152/79 (!) 155/89 (!) 153/88  Pulse:  92 (!) 115 (!) 104  Resp:  18 18 18   Temp:   98.7 F (37.1 C)   TempSrc:   Oral   SpO2:  95% 98% 97%  Weight: 95 kg       Intake/Output Summary (Last 24 hours) at 01/04/2021 1658 Last data filed at 01/04/2021 0900 Gross per 24 hour  Intake 360 ml  Output 1400 ml  Net -1040 ml   Weight change: 0.1 kg Exam:   General:  Pt is alert, follows commands appropriately, not in acute distress  HEENT: No icterus, No thrush, No neck mass, [place]/AT  Cardiovascular: RRR, S1/S2, no rubs, no gallops  Respiratory: bibasilar rales. No wheeze  Abdomen: Soft/+BS, non tender, non distended, no guarding  Extremities: trace LE  edema, No lymphangitis, No petechiae, No rashes, no synovitis   Data Reviewed: I have personally reviewed following labs and imaging studies Basic Metabolic Panel: Recent Labs  Lab 12/31/20 0543 01/01/21 0638 01/02/21 0549 01/03/21 0653 01/04/21 0607  NA 137 137 137 137 139  K 3.3* 3.5 3.5 3.6 3.8  CL 102 103 103 103 103  CO2 24 25 23 24 24   GLUCOSE 189* 236* 188* 170* 190*  BUN 73* 82* 86* 87* 89*  CREATININE 3.54* 4.00* 4.27* 4.41* 4.64*  CALCIUM 6.5* 6.4* 6.5* 6.4* 6.6*  PHOS 4.5 4.7* 4.6 4.4 4.4   Liver Function Tests: Recent Labs  Lab 12/31/20 0543 01/01/21 5170 01/02/21 0549 01/03/21 0653 01/04/21 0607  ALBUMIN 1.8* 1.8* 1.8* 1.8* 1.7*   No  results for input(s): LIPASE, AMYLASE in the last 168 hours. No results for input(s): AMMONIA in the last 168 hours. Coagulation Profile: No results for input(s): INR, PROTIME in the last 168 hours. CBC: Recent Labs  Lab 12/29/20 2014  WBC 16.4*  HGB 8.8*  HCT 26.2*  MCV 92.3  PLT 186   Cardiac Enzymes: No results for input(s): CKTOTAL, CKMB, CKMBINDEX, TROPONINI in the last 168 hours. BNP: Invalid input(s): POCBNP CBG: Recent Labs  Lab 01/03/21 1105 01/03/21 1654 01/03/21 2021 01/04/21 0746 01/04/21 1121  GLUCAP 242* 283* 217* 185* 248*   HbA1C: No results for input(s): HGBA1C in the last 72 hours. Urine analysis:    Component Value Date/Time   COLORURINE YELLOW 12/23/2020 1828   APPEARANCEUR CLEAR 12/23/2020 1828   LABSPEC 1.009 12/23/2020 1828   PHURINE 5.0 12/23/2020 1828   GLUCOSEU 50 (A) 12/23/2020 1828   HGBUR SMALL (A) 12/23/2020 1828   BILIRUBINUR NEGATIVE 12/23/2020 1828   KETONESUR NEGATIVE 12/23/2020 1828   PROTEINUR NEGATIVE 12/23/2020 1828   UROBILINOGEN 0.2 03/20/2013 2016   NITRITE NEGATIVE 12/23/2020 1828   LEUKOCYTESUR NEGATIVE 12/23/2020 1828   Sepsis Labs: @LABRCNTIP (procalcitonin:4,lacticidven:4) )No results found for this or any previous visit (from the past 240 hour(s)).   Scheduled Meds: . aspirin EC  81 mg Oral Daily  . calcium carbonate  800 mg of elemental calcium Oral TID WC  . Chlorhexidine Gluconate Cloth  6 each Topical Daily  . Chlorhexidine Gluconate Cloth  6 each Topical Q0600  . cholecalciferol  2,000 Units Oral Daily  .  darbepoetin (ARANESP) injection - NON-DIALYSIS  100 mcg Subcutaneous Q Tue-1800  . insulin aspart  0-5 Units Subcutaneous QHS  . insulin aspart  0-9 Units Subcutaneous TID WC  . lipase/protease/amylase  12,000 Units Oral TID AC  . mouth rinse  15 mL Mouth Rinse BID  . metoprolol tartrate  50 mg Oral BID  . saccharomyces boulardii  250 mg Oral BID  . sodium bicarbonate  1,300 mg Oral BID   Continuous  Infusions: . sodium chloride    . sodium chloride    . insulin Stopped (12/24/20 2100)    Procedures/Studies: CT Head Wo Contrast  Result Date: 12/23/2020 CLINICAL DATA:  Mental status changes EXAM: CT HEAD WITHOUT CONTRAST TECHNIQUE: Contiguous axial images were obtained from the base of the skull through the vertex without intravenous contrast. COMPARISON:  12/04/2018 FINDINGS: Brain: No acute intracranial abnormality. Specifically, no hemorrhage, hydrocephalus, mass lesion, acute infarction, or significant intracranial injury. There is atrophy and chronic small vessel disease changes. Vascular: No hyperdense vessel or unexpected calcification. Skull: No acute calvarial abnormality. Sinuses/Orbits: No acute findings Other: None IMPRESSION: Atrophy, chronic microvascular disease. No acute intracranial abnormality. Electronically Signed   By: Rolm Baptise M.D.   On: 12/23/2020 12:54   US RENAL  Result Date: 12/24/2020 CLINICAL DATA:  80 year old male with acute renal insufficiency. EXAM: RENAL / URINARY TRACT ULTRASOUND COMPLETE COMPARISON:  Abdomen ultrasound 12/01/2017. CT Abdomen and Pelvis 03/20/2013. FINDINGS: Right Kidney: Renal measurements: 11.8 x 5.3 x 7.5 cm = volume: 247 mL. Cortical echogenicity within normal limits. No hydronephrosis. Extrarenal pelvis is stable (normal variant). No right renal mass. Left Kidney: Renal measurements: 12.9 x 5.6 x 5.9 cm = volume: 221 mL. Cortical echogenicity within normal limits. Lower pole curvilinear hypoechogenicity on image 29 probably corresponds to chronic perinephric stranding seen in 2014. No left hydronephrosis today. Possible punctate chronic lower pole calculus on image 38, stable since 2014. Bladder: Decompressed and poorly visible, reportedly a Foley catheter is in place. Other: Echogenic liver on image 2. IMPRESSION: 1. No hydronephrosis or acute renal finding. Chronic left lower pole nephrolithiasis. 2. Evidence of fatty liver disease.  Electronically Signed   By: Genevie Ann M.D.   On: 12/24/2020 11:18   DG Chest Port 1 View  Result Date: 12/29/2020 CLINICAL DATA:  Status post dialysis catheter placement. EXAM: PORTABLE CHEST 1 VIEW COMPARISON:  Single-view of the chest 12/23/2020. FINDINGS: Left IJ approach dialysis catheter is in place with the tip projecting in the lower superior vena cava. No pneumothorax. Elevation of the right hemidiaphragm is unchanged. Lungs are clear. Heart size is upper normal. No acute or focal bony abnormality. IMPRESSION: Dialysis catheter tip projects in the lower superior vena cava. Negative for pneumothorax or acute disease. Electronically Signed   By: Inge Rise M.D.   On: 12/29/2020 11:42   DG Chest Portable 1 View  Result Date: 12/23/2020 CLINICAL DATA:  Weakness. COVID 2 weeks ago. EXAM: PORTABLE CHEST 1 VIEW COMPARISON:  Two-view chest x-ray 12/03/2018 FINDINGS: Heart is chronically enlarged. Chronic elevation of the right hemidiaphragm noted. No edema or effusion is present. No focal airspace disease is present. Axial skeleton is unremarkable. IMPRESSION: No acute cardiopulmonary disease or significant interval change. Stable cardiomegaly without failure. Electronically Signed   By: San Morelle M.D.   On: 12/23/2020 12:33   ECHOCARDIOGRAM COMPLETE  Result Date: 12/31/2020    ECHOCARDIOGRAM REPORT   Patient Name:   Mark RETH Sr. Date of Exam: 12/31/2020 Medical Rec #:  [id number]  Height:       68.5 in Accession #:    [id number]           Weight:       208.6 lb Date of Birth:  [birth year]            BSA:          2.092 m Patient Age:    55 years             BP:           146/63 mmHg Patient Gender: M                    HR:           84 bpm. Exam Location:  [facility] Procedure: 2D Echo, Cardiac Doppler and Color Doppler Indications:    Atrial Fibrillation I48.91  History:        Patient has prior history of Echocardiogram examinations, most                 recent 12/12/2011.  Risk Factors:Hypertension, Diabetes and                 Dyslipidemia.  Sonographer:    Alvino Chapel RCS Referring Phys: [place]  1. Left ventricular ejection fraction, by estimation, is 60 to 65%. The left ventricle has normal function. The left ventricle has no regional wall motion abnormalities. There is mild left ventricular hypertrophy. Left ventricular diastolic parameters are consistent with Grade I diastolic dysfunction (impaired relaxation).  2. Right ventricular systolic function is normal. The right ventricular size is normal. There is normal pulmonary artery systolic pressure.  3. The mitral valve is normal in structure. No evidence of mitral valve regurgitation. No evidence of mitral stenosis.  4. The aortic valve is tricuspid. Aortic valve regurgitation is not visualized. Mild aortic valve sclerosis is present, with no evidence of aortic valve stenosis.  5. Aortic dilatation noted. There is mild dilatation of the aortic root, measuring 39 mm.  6. The inferior vena cava is normal in size with greater than 50% respiratory variability, suggesting right atrial pressure of 3 mmHg. FINDINGS  Left Ventricle: Left ventricular ejection fraction, by estimation, is 60 to 65%. The left ventricle has normal function. The left ventricle has no regional wall motion abnormalities. The left ventricular internal cavity size was normal in size. There is  mild left ventricular hypertrophy. Left ventricular diastolic parameters are consistent with Grade I diastolic dysfunction (impaired relaxation). Right Ventricle: The right ventricular size is normal. Right ventricular systolic function is normal. There is normal pulmonary artery systolic pressure. The tricuspid regurgitant velocity is 2.82 m/s, and with an assumed right atrial pressure of 3 mmHg,  the estimated right ventricular systolic pressure is 30.0 mmHg. Left Atrium: Left atrial size was normal in size. Right Atrium: Right atrial size was  normal in size. Pericardium: There is no evidence of pericardial effusion. Mitral Valve: The mitral valve is normal in structure. Mild mitral annular calcification. No evidence of mitral valve regurgitation. No evidence of mitral valve stenosis. Tricuspid Valve: The tricuspid valve is normal in structure. Tricuspid valve regurgitation is mild . No evidence of tricuspid stenosis. Aortic Valve: The aortic valve is tricuspid. Aortic valve regurgitation is not visualized. Mild aortic valve sclerosis is present, with no evidence of aortic valve stenosis. Pulmonic Valve: The pulmonic valve was normal in structure. Pulmonic valve regurgitation is not visualized. No evidence of pulmonic stenosis. Aorta: Aortic dilatation  noted. There is mild dilatation of the aortic root, measuring 39 mm. Venous: The inferior vena cava is normal in size with greater than 50% respiratory variability, suggesting right atrial pressure of 3 mmHg.  LEFT VENTRICLE PLAX 2D LVIDd:         4.80 cm  Diastology LVIDs:         3.30 cm  LV e' medial:    5.66 cm/s LV PW:         1.10 cm  LV E/e' medial:  17.0 LV IVS:        1.50 cm  LV e' lateral:   8.81 cm/s LVOT diam:     2.10 cm  LV E/e' lateral: 10.9 LV SV:         85 LV SV Index:   40 LVOT Area:     3.46 cm  RIGHT VENTRICLE RV S prime:     12.00 cm/s TAPSE (M-mode): 2.1 cm LEFT ATRIUM             Index       RIGHT ATRIUM           Index LA diam:        3.00 cm 1.43 cm/m  RA Area:     16.10 cm LA Vol (A2C):   50.1 ml 23.95 ml/m RA Volume:   43.20 ml  20.65 ml/m LA Vol (A4C):   60.2 ml 28.78 ml/m LA Biplane Vol: 56.1 ml 26.82 ml/m  AORTIC VALVE LVOT Vmax:   110.00 cm/s LVOT Vmean:  79.300 cm/s LVOT VTI:    0.244 m  AORTA Ao Root diam: 3.90 cm MITRAL VALVE                TRICUSPID VALVE MV Area (PHT): 4.08 cm     TR Peak grad:   31.8 mmHg MV Decel Time: 186 msec     TR Vmax:        282.00 cm/s MV E velocity: 96.00 cm/s MV A velocity: 117.00 cm/s  SHUNTS MV E/A ratio:  0.82         Systemic VTI:   0.24 m                             Systemic Diam: 2.10 cm Kirk Ruths MD Electronically signed by Kirk Ruths MD Signature Date/Time: 12/31/2020/10:23:47 AM    Final     Orson Eva, DO  [facility]  If 7PM-7AM, please contact night-coverage [email or web address] Password [email or web address] 01/04/2021, 4:58 PM   LOS: 12 days

## 2021-01-04 NOTE — Progress Notes (Signed)
In to get pt OOB and up in chair. Pt incontinent of bowels, cleaned of soft brown medium sized stool. Pericare completed and new condom cath applied. Pt then stated, "I don't want to get up. Let's put it off till tomorrow." Asked pt why he did not want to get OOB to chair. He stated, "It's just too much trouble, it's too hard. I just want to take a nap." Advised pt that we have multiple staff members ready to assist and that laying in the bed only makes him weaker. Pt states, 'I know, but I just don't want to get up. I'm tired." Pt refuses to move legs to assist to side of bed. Advised pt that moving and sitting up in chair will assist his breathing and his circulation, along with increasing his strength. Pt still adamant that he is not getting OOB. Advised pt that if he changed his mind and wanted to get up in chair later today to let us know. Pt stated understanding.

## 2021-01-05 LAB — RENAL FUNCTION PANEL
Albumin: 1.7 g/dL — ABNORMAL LOW (ref 3.5–5.0)
Anion gap: 13 (ref 5–15)
BUN: 87 mg/dL — ABNORMAL HIGH (ref 8–23)
CO2: 23 mmol/L (ref 22–32)
Calcium: 6.3 mg/dL — CL (ref 8.9–10.3)
Chloride: 103 mmol/L (ref 98–111)
Creatinine, Ser: 4.88 mg/dL — ABNORMAL HIGH (ref 0.61–1.24)
GFR, Estimated: 11 mL/min — ABNORMAL LOW (ref >60)
Glucose, Bld: 159 mg/dL — ABNORMAL HIGH (ref 70–99)
Phosphorus: 4.7 mg/dL — ABNORMAL HIGH (ref 2.5–4.6)
Potassium: 3.6 mmol/L (ref 3.5–5.1)
Sodium: 139 mmol/L (ref 135–145)

## 2021-01-05 LAB — URINALYSIS, COMPLETE (UACMP) WITH MICROSCOPIC
Bilirubin Urine: NEGATIVE
Glucose, UA: >=500 mg/dL — AB
Ketones, ur: NEGATIVE mg/dL
Nitrite: NEGATIVE
Protein, ur: 100 mg/dL — AB
RBC / HPF: >50 RBC/hpf — ABNORMAL HIGH (ref 0–5)
Specific Gravity, Urine: 1.007 (ref 1.005–1.030)
WBC, UA: >50 WBC/hpf — ABNORMAL HIGH (ref 0–5)
pH: 5 (ref 5.0–8.0)

## 2021-01-05 LAB — C-REACTIVE PROTEIN: CRP: 12.1 mg/dL — ABNORMAL HIGH (ref <1.0)

## 2021-01-05 LAB — GLUCOSE, CAPILLARY
Glucose-Capillary: 281 mg/dL — ABNORMAL HIGH (ref 70–99)
Glucose-Capillary: 156 mg/dL — ABNORMAL HIGH (ref 70–99)
Glucose-Capillary: 267 mg/dL — ABNORMAL HIGH (ref 70–99)
Glucose-Capillary: 258 mg/dL — ABNORMAL HIGH (ref 70–99)
Glucose-Capillary: 214 mg/dL — ABNORMAL HIGH (ref 70–99)

## 2021-01-05 LAB — FERRITIN: Ferritin: 125 ng/mL (ref 24–336)

## 2021-01-05 MED ORDER — CALCIUM GLUCONATE-NACL 1-0.675 GM/50ML-% IV SOLN
1.0000 g | INTRAVENOUS | Status: AC
  Administered 2021-01-05 (×2): 1000 mg via INTRAVENOUS
  Filled 2021-01-05 (×2): qty 50

## 2021-01-05 MED ORDER — CALCIUM GLUCONATE-NACL 2-0.675 GM/100ML-% IV SOLN: 2.0000 g | Freq: Once | INTRAVENOUS | Status: DC

## 2021-01-05 MED ORDER — ALBUMIN HUMAN 25 % IV SOLN
25.0000 g | Freq: Four times a day (QID) | INTRAVENOUS | Status: AC
  Administered 2021-01-05 – 2021-01-06 (×3): 25 g via INTRAVENOUS
  Filled 2021-01-05 (×3): qty 100

## 2021-01-05 NOTE — Progress Notes (Signed)
Date and time results received: 01/05/21  - 0846   Test: Calcium Critical Value: 6.3  Name of Provider Notified: Tat  Orders Received? Or Actions Taken?: No new orders at present

## 2021-01-05 NOTE — Plan of Care (Signed)
  Problem: Safety: Goal: Ability to remain free from injury will improve Outcome: Progressing   Problem: Skin Integrity: Goal: Risk for impaired skin integrity will decrease Outcome: Progressing   Problem: Education: Goal: Knowledge of disease and its progression will improve Outcome: Progressing Goal: Individualized Educational Video(s) Outcome: Progressing   Problem: Fluid Volume: Goal: Compliance with measures to maintain balanced fluid volume will improve Outcome: Progressing   Problem: Health Behavior/Discharge Planning: Goal: Ability to manage health-related needs will improve Outcome: Progressing   Problem: Nutritional: Goal: Ability to make healthy dietary choices will improve Outcome: Progressing   Problem: Clinical Measurements: Goal: Complications related to the disease process, condition or treatment will be avoided or minimized Outcome: Progressing

## 2021-01-05 NOTE — Progress Notes (Addendum)
Physical Therapy Treatment Patient Details Name: [name] Sr. MRN: [id number] DOB: 01/21/1941 Today's Date: 01/05/2021    History of Present Illness Mark L Laible Sr. is a 80 y.o. male with medical history significant for type 2 diabetes, CKD stage IIIb, hypertension, dyslipidemia, diastolic CHF, and CAD described to the ED via EMS with increasing weakness and confusion along with poor appetite for the last 2 to 3 days.  Symptoms have been present over the past week and patient was diagnosed with COVID-19 infection on May 7.  He saw his PCP on 5/19 for confusion and was not noted to be hypoxemic at that time.  He was told to hold his Lasix and encouraged to drink plenty of fluids, but he really has not been able to drink very much fluids according to the wife at the bedside.  He was started on some mild delirium related to his COVID infection.  Unfortunately, his symptoms have progressed significantly to days.  Patient cannot give any further history at this time given his severe confusion.  Primary historian his wife at bedside.    PT Comments    Patient was found asleep in bed but was agreeable to therapy. Patient demonstrated fair bed mobility with min/mod assist. Patient demonstrated 2 sit to stands with min/mod assistance with verbal cueing on foot placement and use of RW. Patient was able to ambulate 4 feet during stand and pivot transfer with min/mod assist. Patient tolerated standing up to 2--3 using RW before requesting to sit down due to fatigue.  Patient remained in chair after therapy - RN notified. Patient will benefit from continued physical therapy in hospital and recommended venue below to increase strength, balance, endurance for safe ADLs and gait.   Follow Up Recommendations  SNF;Supervision - Intermittent;Supervision for mobility/OOB     Equipment Recommendations  None recommended by PT    Recommendations for Other Services       Precautions / Restrictions  Precautions Precautions: Fall Restrictions Weight Bearing Restrictions: No    Mobility  Bed Mobility Overal bed mobility: Needs Assistance Bed Mobility: Supine to Sit     Supine to sit: Min assist;Mod assist     General bed mobility comments: increased time, labored movement    Transfers Overall transfer level: Needs assistance Equipment used: Standard walker Transfers: Sit to/from Stand;Stand Pivot Transfers Sit to Stand: Mod assist Stand pivot transfers: Mod assist       General transfer comment: slow labored movement, required verbal/tactile cues for hand placement on RW and to Push into the walker when standing  Ambulation/Gait Ambulation/Gait assistance: Mod assist Gait Distance (Feet): 4 Feet Assistive device: Rolling walker (2 wheeled) Gait Pattern/deviations: Decreased step length - right;Decreased step length - left;Decreased stride length Gait velocity: decreased   General Gait Details: limited to 4-5 steps during the stand pivot transfer   Stairs             Wheelchair Mobility    Modified Rankin (Stroke Patients Only)       Balance Overall balance assessment: Needs assistance Sitting-balance support: Feet supported;No upper extremity supported Sitting balance-Leahy Scale: Fair Sitting balance - Comments: fair/good seated at EOB   Standing balance support: During functional activity;Bilateral upper extremity supported Standing balance-Leahy Scale: Poor Standing balance comment: using RW                            Cognition Arousal/Alertness: Awake/alert Behavior During Therapy: WFL for tasks assessed/performed  Overall Cognitive Status: Within Functional Limits for tasks assessed                                        Exercises General Exercises - Lower Extremity Ankle Circles/Pumps: AROM;Strengthening;10 reps;Both;Seated Long Arc Quad: AROM;Strengthening;Seated;Both;10 reps Hip Flexion/Marching:  AROM;Seated;Strengthening;Both;10 reps Heel Raises: AROM;Seated;Strengthening;Both;10 reps    General Comments        Pertinent Vitals/Pain Pain Assessment: No/denies pain    Home Living                      Prior Function            PT Goals (current goals can now be found in the care plan section) Acute Rehab PT Goals Patient Stated Goal: return home after rehab PT Goal Formulation: With patient Time For Goal Achievement: 01/16/21 Potential to Achieve Goals: Good Progress towards PT goals: Progressing toward goals    Frequency    Min 3X/week      PT Plan Current plan remains appropriate    Co-evaluation              AM-PAC PT "6 Clicks" Mobility   Outcome Measure  Help needed turning from your back to your side while in a flat bed without using bedrails?: A Lot Help needed moving from lying on your back to sitting on the side of a flat bed without using bedrails?: A Lot Help needed moving to and from a bed to a chair (including a wheelchair)?: A Lot Help needed standing up from a chair using your arms (e.g., wheelchair or bedside chair)?: A Lot Help needed to walk in hospital room?: A Lot Help needed climbing 3-5 steps with a railing? : Total 6 Click Score: 11    End of Session   Activity Tolerance: Patient limited by fatigue;Patient tolerated treatment well Patient left: in chair;with call bell/phone within reach;with chair alarm set Nurse Communication: Mobility status PT Visit Diagnosis: Unsteadiness on feet (R26.81);Other abnormalities of gait and mobility (R26.89);Muscle weakness (generalized) (M62.81)     Time: [id number] PT Time Calculation (min) (ACUTE ONLY): 31 min  Charges:  $Therapeutic Exercise: 8-22 mins $Therapeutic Activity: 8-22 mins                     3:02 PM, 01/05/21 Lailana Shira Cousler SPT  3:04 PM, 01/05/21 Lonell Grandchild, MPT Physical Therapist with [facility] 336 [phone] office [phone] mobile  phone

## 2021-01-05 NOTE — Progress Notes (Signed)
Patient's wife had a pleasant visit with him and her questions were answered to best of ability. Patient is rather flat affected, having some remorse over not being able to hear the staff well. Per his wife, his hearing aide needs to be readjusted and they will do this after he is discharged. For now, all other needs addressed.

## 2021-01-05 NOTE — Progress Notes (Addendum)
[facility] NEPHROLOGY PROGRESS NOTE  Assessment/ Plan: Pt is a 80 y.o. yo male with history of DM, hypertension, HLD, diastolic CHF, CAD, CKD stage IIIb who was admitted with generalized weakness, altered mental status and poor appetite seen for AKI on CKD.  #Acute kidney injury on CKD 3b, nonoliguric presumably due to ischemic ATN in the setting of volume depletion/hypotension and acute illness due to COVID-19 pneumonia.  He has received 2 dialysis session because of azotemia/uremia with improvement of mental status.  Last HD on 5/28.  The creatinine level mildly trending up however the BUN level is stabilized.  He has good urine output but it is not documented correctly since yesterday because of incontinence/leaking external catheter.  He also has poor oral intake and hypoalbuminemia. I will order 3 doses of IV albumin to increase effective intravascular volume.  No urgent need for dialysis therefore plan to watch renal recovery over the weekend.  I have discussed with his wife and nursing staffs.  Encourage oral intake. Continue daily lab, strict ins and out.   #Acute metabolic encephalopathy: Multifactorial etiology including COVID-pneumonia, azotemia/uremia.  The mental status seems to be improving after dialysis.  Continue to watch.  Still looks very weak and deconditioned.  He will benefit from PT OT therapy.  # Anemia of acute illness: Monitor hemoglobin.  Received ESA.  Transfuse as needed.  He is COVID-positive.  #Secondary hyperparathyroidism.hypocalcemia: Vitamin D level 25.2, PTH 298.  Continue calcium carbonate and  started oral vitamin D.  # HTN/volume: Blood pressure acceptable.  Currently on metoprolol.  Continue monitor.  #Metabolic acidosis: Continue sodium bicarbonate.  Please call us with question over the weekend.  Subjective: Seen and examined at bedside.  No new event.  No exact measurement of urine output because the external urinary catheter was  leaking.  He is alert awake and mental status seems to be stable.  His wife at bedside.  He denies chest pain, shortness of breath, nausea, vomiting.  Objective Vital signs in last 24 hours: Vitals:   01/04/21 1420 01/04/21 2038 01/05/21 0430 01/05/21 0500  BP: (!) 153/88 (!) 161/80 (!) 148/79   Pulse: (!) 104 96 88   Resp: 18 19 18    Temp:  98.2 F (36.8 C) 98.2 F (36.8 C)   TempSrc:      SpO2: 97% 98% 98%   Weight:    95.1 kg   Weight change: 0.1 kg  Intake/Output Summary (Last 24 hours) at 01/05/2021 1057 Last data filed at 01/05/2021 9983 Gross per 24 hour  Intake 240 ml  Output 300 ml  Net -60 ml       Labs: Basic Metabolic Panel: Recent Labs  Lab 01/03/21 0653 01/04/21 0607 01/05/21 0550  NA 137 139 139  K 3.6 3.8 3.6  CL 103 103 103  CO2 24 24 23   GLUCOSE 170* 190* 159*  BUN 87* 89* 87*  CREATININE 4.41* 4.64* 4.88*  CALCIUM 6.4* 6.6* 6.3*  PHOS 4.4 4.4 4.7*   Liver Function Tests: Recent Labs  Lab 01/03/21 0653 01/04/21 0607 01/05/21 0550  ALBUMIN 1.8* 1.7* 1.7*   No results for input(s): LIPASE, AMYLASE in the last 168 hours. No results for input(s): AMMONIA in the last 168 hours. CBC: Recent Labs  Lab 12/29/20 2014  WBC 16.4*  HGB 8.8*  HCT 26.2*  MCV 92.3  PLT 186   Cardiac Enzymes: No results for input(s): CKTOTAL, CKMB, CKMBINDEX, TROPONINI in the last 168 hours. CBG: Recent Labs  Lab  01/03/21 2021 01/04/21 0746 01/04/21 1121 01/04/21 2041 01/05/21 0748  GLUCAP 217* 185* 248* 242* 156*    Iron Studies:  Recent Labs    01/05/21 0550  FERRITIN 125   Studies/Results: No results found.  Medications: Infusions: . sodium chloride    . sodium chloride    . calcium gluconate      Scheduled Medications: . aspirin EC  81 mg Oral Daily  . calcium carbonate  800 mg of elemental calcium Oral TID WC  . Chlorhexidine Gluconate Cloth  6 each Topical Daily  . Chlorhexidine Gluconate Cloth  6 each Topical Q0600  .  cholecalciferol  2,000 Units Oral Daily  . darbepoetin (ARANESP) injection - NON-DIALYSIS  100 mcg Subcutaneous Q Tue-1800  . insulin aspart  0-5 Units Subcutaneous QHS  . insulin aspart  0-9 Units Subcutaneous TID WC  . insulin aspart  3 Units Subcutaneous TID WC  . lipase/protease/amylase  12,000 Units Oral TID AC  . mouth rinse  15 mL Mouth Rinse BID  . metoprolol tartrate  50 mg Oral BID  . saccharomyces boulardii  250 mg Oral BID  . sodium bicarbonate  1,300 mg Oral BID    have reviewed scheduled and prn medications.  Physical Exam: General:NAD, alert awake, able to lie flat comfortable Heart:RRR, s1s2 nl Lungs:clear b/l, no crackle Abdomen:soft, Non-tender, non-distended Extremities: Trace edema Dialysis Access: Temporary HD catheter present  Samiel Peel Prasad Mubashir Mallek 01/05/2021,10:57 AM  LOS: 13 days

## 2021-01-05 NOTE — TOC Progression Note (Signed)
Transition of Care (TOC) - Progression Note    Patient Details  Name: Mark TEATER Sr. MRN: [id number] Date of Birth: 1940-12-26  Transition of Care [facility]) CM/SW Contact  Natasha Bence, LCSW Phone Number: 01/05/2021, 3:18 PM  Clinical Narrative:    CSW notified patient's wife of current bed offers. Patient's wife agreeable to fax referral to [facility], and [facility] SNF's. TOC to follow.   Expected Discharge Plan: Skilled Nursing Facility Barriers to Discharge: Continued Medical Work up  Expected Discharge Plan and Services Expected Discharge Plan: [place] Choice: [place] arrangements for the past 2 months: Single Family Home                                       Social Determinants of Health (SDOH) Interventions    Readmission Risk Interventions Readmission Risk Prevention Plan 12/04/2018  Transportation Screening Complete  PCP or Specialist Appt within 3-5 Days (No Data)  [place] or [place] Complete  Social Work Consult for [place] Planning/Counseling Complete  Palliative Care Screening Not Applicable  Medication Review [employer]) Complete  Some recent data might be hidden

## 2021-01-05 NOTE — Progress Notes (Signed)
Wife currently at bedside, pt eating fruit turnover brought in by wife. Nephrologist to bedside, updated pt, wife and this nurse on plan of care. States will observe pt through the weekend, watching BUN/Cr levels and urine output and then make decision on further dialysis treatments on Monday. Wife and pt state understanding. Wife states concern that pt seems, "down in the dumps and maybe a little depressed, not as perky." MD Tat notified of wife's concerns.

## 2021-01-05 NOTE — Progress Notes (Signed)
Assisted patient to sit in the bedside chair with assist of a walker and two person to either side. He was able to bear weight and pivot. He had a small BM while in the chair, and was assisted to a standing position for cleansing. Noted green drainage to the pad under the patient at that time. Patient's private area cleansed and patient was talked into sitting up a bit longer.  When we returned, he had another scant BM, and the drainage was there again.  Patient then was assisted up and back into bed. At this point, it was noted by myself and Crystal, RN that patient had some pus like drainage around his penile head and pooling onto his scrotum. Area cleansed again, and palpation to the skin around and abd revealed more drainage from the penile head that was thick and green tinged.   Asked patient about his symptoms, he stated he felt as though he could not fully empty his bladder, and then stated "more is coming" to which, more pus like liquid came from the penile head. Area cleansed again. We completed a bladder scan, and it showed 45m.   Obtained a set of vitals for patient and notified Dr. TCarles Colletvia Epic text of events. A UA and Urine Culture were obtained through clean catch and sent to the lab.   Patient now resting in bed. Continuing to monitor this patient.

## 2021-01-05 NOTE — Progress Notes (Signed)
PROGRESS NOTE  [name] KTG:[id number] DOB: 1940/08/23 DOA: 12/23/2020 PCP: [name], MD  Brief History: Mark Dicksonis a 80 y.o.malewith medical history significant fortype 2 diabetes, CKD stage IIIb, hypertension, dyslipidemia, diastolic CHF, and CAD described to the ED via EMS with increasing weakness and confusion along with poor appetite for the last 2 to 3 days.Patient was admitted with AKI and severe acidosis along with hyperglycemia. He continues to have persistent AKI with uremic encephalopathy. He is also noted to be COVID-positive and has been started on remdesivir.Patient remained stable on RA. He received 3 days remdesivir. Unfortunately, he remained confused and his renal function continued to deteriorate. Nephrology was consulted. A temporary HD catheter was place on 12/29/20 and patient was initiated on HD on 12/29/20 and 5/28. Now, the patient is non-oliguric with close observation for renal recovery. With HD, his mental status improved.  Assessment/Plan: Generalized weakness with acute metabolic encephalopathy -secondary to AKI with severe metabolic acidosis and uncompensated respiratory alkalosis -Suspected to be secondary to uremic encephalopathy. -acidosis due to AKI; -mentationimproving andstable; patient cooperative with examination and oriented x3..  Non-oliguricAKIon CKD3b -baseline creatinine 1.7-2.0 -suspect ischemic ATN in the setting of prerenal with dehydration, volume depletion, hypotension and acute COVID infection. -Renal ultrasoundwithout hydronephrosis -avoiding nephrotoxic agents, hypotension and the use of contrast. -General surgery placed left internal jugular dialysis catheter 12/29/2020; -First dialysis provided on 12/29/2020, overall well-tolerated. Second dialysis treatment much better tolerated on 12/30/2020.  -continue monitoring for renal recovery -ContinueIVF per  nephrology  Hypocalcemia -Continue to follow electrolytes trend -Continue telemetry monitoring. -Continue Tums as per nephrology recommendation. -add vitamin D -01/05/21--calcium gluconate added  Hyperglycemiain setting of DM2with mild DKA -resolved -Compliant with home medications according to wife at bedside -Likely stress-induced -Hemoglobin A1c7.0% demonstrating good control. -Continue sliding scale insulin and follow CBGs fluctuation -add novolog 3 units with meals  COVID-19 infection -Chest x-ray with stable cardiomegaly and no other signs of infiltrate -Patient received 3 days of remdesivir for asymptomatic COVID-19 infection. -No need for steroidsas pt stable on RA -Continue isolation precautionsand continue practicing diet 3 W's (Wait 6 feet apart, Wash hands frequently and Wear a mask)at time of discharge.  Thrombocytopenia -Overallstable; likely due to acute medical illness -Continue to monitor platelet count trend. -Continue avoiding heparin agents -B12--634 -folate--24.5 -TSH--1.477  Hypertension/CAD -continue metoprolol -no chest pain -12/31/20 Echo--EF 60-65%, no WMA, G1DD  Very Hard of hearing -Patient uses hearing aids at baseline.  A. Fib with RVR, type unspecified -No sustained and associated with hemodialysis initiation -After resumption ofmetoprololand dose adjustment provided; patient has remained normal rate and sinus rhythm. -Denies palpitations, chest pain and shortness of breath. -2D echo with preserved ejection fraction, no wall motion normalities and grade 1 diastolic dysfunction. No significant valvular disorder appreciated.  Physical deconditioning -Physical therapy has seen patient and recommended to skilled nursing facility at discharge for rehabilitation.     Status is: Inpatient  Remains inpatient appropriate because:IV treatments appropriate due to intensity of illness or inability to take PO   Dispo: The  patient is from:Home Anticipated d/c is to:SNF Patient currently is not medically stable to d/c. Difficult to place patient No        Family Communication:left VM for spouse  6/2  Consultants:renal  Code Status: FULL   DVT Prophylaxis: SCDs   Procedures: As Listed in Progress Note Above  Antibiotics: None    Subjective: Patient denies fevers, chills, headache,  chest pain, dyspnea, nausea, vomiting, diarrhea, abdominal pain  Objective: Vitals:   01/04/21 2038 01/05/21 0430 01/05/21 0500 01/05/21 1308  BP: (!) 161/80 (!) 148/79  (!) 148/62  Pulse: 96 88  85  Resp: 19 18  18   Temp: 98.2 F (36.8 C) 98.2 F (36.8 C)  98.9 F (37.2 C)  TempSrc:    Oral  SpO2: 98% 98%  96%  Weight:   95.1 kg     Intake/Output Summary (Last 24 hours) at 01/05/2021 1630 Last data filed at 01/05/2021 8768 Gross per 24 hour  Intake 240 ml  Output 300 ml  Net -60 ml   Weight change: 0.1 kg Exam:   General:  Pt is alert, follows commands appropriately, not in acute distress  HEENT: No icterus, No thrush, No neck mass, [place]/AT  Cardiovascular: RRR, S1/S2, no rubs, no gallops  Respiratory: bibasilar rales. No wheeze  Abdomen: Soft/+BS, non tender, non distended, no guarding  Extremities: No edema, No lymphangitis, No petechiae, No rashes, no synovitis   Data Reviewed: I have personally reviewed following labs and imaging studies Basic Metabolic Panel: Recent Labs  Lab 01/01/21 0638 01/02/21 0549 01/03/21 0653 01/04/21 0607 01/05/21 0550  NA 137 137 137 139 139  K 3.5 3.5 3.6 3.8 3.6  CL 103 103 103 103 103  CO2 25 23 24 24 23   GLUCOSE 236* 188* 170* 190* 159*  BUN 82* 86* 87* 89* 87*  CREATININE 4.00* 4.27* 4.41* 4.64* 4.88*  CALCIUM 6.4* 6.5* 6.4* 6.6* 6.3*  PHOS 4.7* 4.6 4.4 4.4 4.7*   Liver Function Tests: Recent Labs  Lab 01/01/21 1157 01/02/21 0549 01/03/21 0653 01/04/21 0607 01/05/21 0550   ALBUMIN 1.8* 1.8* 1.8* 1.7* 1.7*   No results for input(s): LIPASE, AMYLASE in the last 168 hours. No results for input(s): AMMONIA in the last 168 hours. Coagulation Profile: No results for input(s): INR, PROTIME in the last 168 hours. CBC: Recent Labs  Lab 12/29/20 2014  WBC 16.4*  HGB 8.8*  HCT 26.2*  MCV 92.3  PLT 186   Cardiac Enzymes: No results for input(s): CKTOTAL, CKMB, CKMBINDEX, TROPONINI in the last 168 hours. BNP: Invalid input(s): POCBNP CBG: Recent Labs  Lab 01/04/21 1629 01/04/21 2041 01/05/21 0748 01/05/21 1137 01/05/21 1621  GLUCAP 281* 242* 156* 267* 258*   HbA1C: No results for input(s): HGBA1C in the last 72 hours. Urine analysis:    Component Value Date/Time   COLORURINE YELLOW 12/23/2020 1828   APPEARANCEUR CLEAR 12/23/2020 1828   LABSPEC 1.009 12/23/2020 1828   PHURINE 5.0 12/23/2020 1828   GLUCOSEU 50 (A) 12/23/2020 1828   HGBUR SMALL (A) 12/23/2020 1828   BILIRUBINUR NEGATIVE 12/23/2020 1828   KETONESUR NEGATIVE 12/23/2020 1828   PROTEINUR NEGATIVE 12/23/2020 1828   UROBILINOGEN 0.2 03/20/2013 2016   NITRITE NEGATIVE 12/23/2020 1828   LEUKOCYTESUR NEGATIVE 12/23/2020 1828   Sepsis Labs: @LABRCNTIP (procalcitonin:4,lacticidven:4) )No results found for this or any previous visit (from the past 240 hour(s)).   Scheduled Meds: . aspirin EC  81 mg Oral Daily  . calcium carbonate  800 mg of elemental calcium Oral TID WC  . Chlorhexidine Gluconate Cloth  6 each Topical Daily  . Chlorhexidine Gluconate Cloth  6 each Topical Q0600  . cholecalciferol  2,000 Units Oral Daily  . darbepoetin (ARANESP) injection - NON-DIALYSIS  100 mcg Subcutaneous Q Tue-1800  . insulin aspart  0-5 Units Subcutaneous QHS  . insulin aspart  0-9 Units Subcutaneous TID WC  . insulin aspart  3 Units Subcutaneous TID WC  . lipase/protease/amylase  12,000 Units Oral TID AC  . mouth rinse  15 mL Mouth Rinse BID  . metoprolol tartrate  50 mg Oral BID  .  saccharomyces boulardii  250 mg Oral BID  . sodium bicarbonate  1,300 mg Oral BID   Continuous Infusions: . sodium chloride    . sodium chloride    . albumin human 25 g (01/05/21 1514)    Procedures/Studies: CT Head Wo Contrast  Result Date: 12/23/2020 CLINICAL DATA:  Mental status changes EXAM: CT HEAD WITHOUT CONTRAST TECHNIQUE: Contiguous axial images were obtained from the base of the skull through the vertex without intravenous contrast. COMPARISON:  12/04/2018 FINDINGS: Brain: No acute intracranial abnormality. Specifically, no hemorrhage, hydrocephalus, mass lesion, acute infarction, or significant intracranial injury. There is atrophy and chronic small vessel disease changes. Vascular: No hyperdense vessel or unexpected calcification. Skull: No acute calvarial abnormality. Sinuses/Orbits: No acute findings Other: None IMPRESSION: Atrophy, chronic microvascular disease. No acute intracranial abnormality. Electronically Signed   By: Rolm Baptise M.D.   On: 12/23/2020 12:54   US RENAL  Result Date: 12/24/2020 CLINICAL DATA:  80 year old male with acute renal insufficiency. EXAM: RENAL / URINARY TRACT ULTRASOUND COMPLETE COMPARISON:  Abdomen ultrasound 12/01/2017. CT Abdomen and Pelvis 03/20/2013. FINDINGS: Right Kidney: Renal measurements: 11.8 x 5.3 x 7.5 cm = volume: 247 mL. Cortical echogenicity within normal limits. No hydronephrosis. Extrarenal pelvis is stable (normal variant). No right renal mass. Left Kidney: Renal measurements: 12.9 x 5.6 x 5.9 cm = volume: 221 mL. Cortical echogenicity within normal limits. Lower pole curvilinear hypoechogenicity on image 29 probably corresponds to chronic perinephric stranding seen in 2014. No left hydronephrosis today. Possible punctate chronic lower pole calculus on image 38, stable since 2014. Bladder: Decompressed and poorly visible, reportedly a Foley catheter is in place. Other: Echogenic liver on image 2. IMPRESSION: 1. No hydronephrosis or  acute renal finding. Chronic left lower pole nephrolithiasis. 2. Evidence of fatty liver disease. Electronically Signed   By: Genevie Ann M.D.   On: 12/24/2020 11:18   DG Chest Port 1 View  Result Date: 12/29/2020 CLINICAL DATA:  Status post dialysis catheter placement. EXAM: PORTABLE CHEST 1 VIEW COMPARISON:  Single-view of the chest 12/23/2020. FINDINGS: Left IJ approach dialysis catheter is in place with the tip projecting in the lower superior vena cava. No pneumothorax. Elevation of the right hemidiaphragm is unchanged. Lungs are clear. Heart size is upper normal. No acute or focal bony abnormality. IMPRESSION: Dialysis catheter tip projects in the lower superior vena cava. Negative for pneumothorax or acute disease. Electronically Signed   By: Inge Rise M.D.   On: 12/29/2020 11:42   DG Chest Portable 1 View  Result Date: 12/23/2020 CLINICAL DATA:  Weakness. COVID 2 weeks ago. EXAM: PORTABLE CHEST 1 VIEW COMPARISON:  Two-view chest x-ray 12/03/2018 FINDINGS: Heart is chronically enlarged. Chronic elevation of the right hemidiaphragm noted. No edema or effusion is present. No focal airspace disease is present. Axial skeleton is unremarkable. IMPRESSION: No acute cardiopulmonary disease or significant interval change. Stable cardiomegaly without failure. Electronically Signed   By: San Morelle M.D.   On: 12/23/2020 12:33   ECHOCARDIOGRAM COMPLETE  Result Date: 12/31/2020    ECHOCARDIOGRAM REPORT   Patient Name:   Mark TORTORELLA Sr. Date of Exam: 12/31/2020 Medical Rec #:  [id number]            Height:       68.5 in Accession #:  [id number]           Weight:       208.6 lb Date of Birth:  11/07/1940            BSA:          2.092 m Patient Age:    67 years             BP:           146/63 mmHg Patient Gender: M                    HR:           84 bpm. Exam Location:  [facility] Procedure: 2D Echo, Cardiac Doppler and Color Doppler Indications:    Atrial Fibrillation I48.91  History:         Patient has prior history of Echocardiogram examinations, most                 recent 12/12/2011. Risk Factors:Hypertension, Diabetes and                 Dyslipidemia.  Sonographer:    Alvino Chapel RCS Referring Phys: [place]  1. Left ventricular ejection fraction, by estimation, is 60 to 65%. The left ventricle has normal function. The left ventricle has no regional wall motion abnormalities. There is mild left ventricular hypertrophy. Left ventricular diastolic parameters are consistent with Grade I diastolic dysfunction (impaired relaxation).  2. Right ventricular systolic function is normal. The right ventricular size is normal. There is normal pulmonary artery systolic pressure.  3. The mitral valve is normal in structure. No evidence of mitral valve regurgitation. No evidence of mitral stenosis.  4. The aortic valve is tricuspid. Aortic valve regurgitation is not visualized. Mild aortic valve sclerosis is present, with no evidence of aortic valve stenosis.  5. Aortic dilatation noted. There is mild dilatation of the aortic root, measuring 39 mm.  6. The inferior vena cava is normal in size with greater than 50% respiratory variability, suggesting right atrial pressure of 3 mmHg. FINDINGS  Left Ventricle: Left ventricular ejection fraction, by estimation, is 60 to 65%. The left ventricle has normal function. The left ventricle has no regional wall motion abnormalities. The left ventricular internal cavity size was normal in size. There is  mild left ventricular hypertrophy. Left ventricular diastolic parameters are consistent with Grade I diastolic dysfunction (impaired relaxation). Right Ventricle: The right ventricular size is normal. Right ventricular systolic function is normal. There is normal pulmonary artery systolic pressure. The tricuspid regurgitant velocity is 2.82 m/s, and with an assumed right atrial pressure of 3 mmHg,  the estimated right ventricular systolic pressure is  09.8 mmHg. Left Atrium: Left atrial size was normal in size. Right Atrium: Right atrial size was normal in size. Pericardium: There is no evidence of pericardial effusion. Mitral Valve: The mitral valve is normal in structure. Mild mitral annular calcification. No evidence of mitral valve regurgitation. No evidence of mitral valve stenosis. Tricuspid Valve: The tricuspid valve is normal in structure. Tricuspid valve regurgitation is mild . No evidence of tricuspid stenosis. Aortic Valve: The aortic valve is tricuspid. Aortic valve regurgitation is not visualized. Mild aortic valve sclerosis is present, with no evidence of aortic valve stenosis. Pulmonic Valve: The pulmonic valve was normal in structure. Pulmonic valve regurgitation is not visualized. No evidence of pulmonic stenosis. Aorta: Aortic dilatation noted. There is mild dilatation of the aortic root, measuring 39 mm. Venous: The  inferior vena cava is normal in size with greater than 50% respiratory variability, suggesting right atrial pressure of 3 mmHg.  LEFT VENTRICLE PLAX 2D LVIDd:         4.80 cm  Diastology LVIDs:         3.30 cm  LV e' medial:    5.66 cm/s LV PW:         1.10 cm  LV E/e' medial:  17.0 LV IVS:        1.50 cm  LV e' lateral:   8.81 cm/s LVOT diam:     2.10 cm  LV E/e' lateral: 10.9 LV SV:         85 LV SV Index:   40 LVOT Area:     3.46 cm  RIGHT VENTRICLE RV S prime:     12.00 cm/s TAPSE (M-mode): 2.1 cm LEFT ATRIUM             Index       RIGHT ATRIUM           Index LA diam:        3.00 cm 1.43 cm/m  RA Area:     16.10 cm LA Vol (A2C):   50.1 ml 23.95 ml/m RA Volume:   43.20 ml  20.65 ml/m LA Vol (A4C):   60.2 ml 28.78 ml/m LA Biplane Vol: 56.1 ml 26.82 ml/m  AORTIC VALVE LVOT Vmax:   110.00 cm/s LVOT Vmean:  79.300 cm/s LVOT VTI:    0.244 m  AORTA Ao Root diam: 3.90 cm MITRAL VALVE                TRICUSPID VALVE MV Area (PHT): 4.08 cm     TR Peak grad:   31.8 mmHg MV Decel Time: 186 msec     TR Vmax:        282.00 cm/s MV E  velocity: 96.00 cm/s MV A velocity: 117.00 cm/s  SHUNTS MV E/A ratio:  0.82         Systemic VTI:  0.24 m                             Systemic Diam: 2.10 cm Kirk Ruths MD Electronically signed by Kirk Ruths MD Signature Date/Time: 12/31/2020/10:23:47 AM    Final     Orson Eva, DO  [facility]  If 7PM-7AM, please contact night-coverage [email or web address] Password [email or web address] 01/05/2021, 4:30 PM   LOS: 13 days

## 2021-01-06 LAB — CBC
HCT: 23.6 % — ABNORMAL LOW (ref 39.0–52.0)
Hemoglobin: 7.1 g/dL — ABNORMAL LOW (ref 13.0–17.0)
MCH: 30.6 pg (ref 26.0–34.0)
MCHC: 30.1 g/dL (ref 30.0–36.0)
MCV: 101.7 fL — ABNORMAL HIGH (ref 80.0–100.0)
Platelets: 136 10*3/uL — ABNORMAL LOW (ref 150–400)
RBC: 2.32 MIL/uL — ABNORMAL LOW (ref 4.22–5.81)
RDW: 13.2 % (ref 11.5–15.5)
WBC: 8.4 10*3/uL (ref 4.0–10.5)
nRBC: 0 % (ref 0.0–0.2)

## 2021-01-06 LAB — GLUCOSE, CAPILLARY
Glucose-Capillary: 74 mg/dL (ref 70–99)
Glucose-Capillary: 133 mg/dL — ABNORMAL HIGH (ref 70–99)
Glucose-Capillary: 122 mg/dL — ABNORMAL HIGH (ref 70–99)
Glucose-Capillary: 157 mg/dL — ABNORMAL HIGH (ref 70–99)

## 2021-01-06 LAB — RENAL FUNCTION PANEL
Albumin: 2.8 g/dL — ABNORMAL LOW (ref 3.5–5.0)
Anion gap: 13 (ref 5–15)
BUN: 88 mg/dL — ABNORMAL HIGH (ref 8–23)
CO2: 26 mmol/L (ref 22–32)
Calcium: 6.7 mg/dL — ABNORMAL LOW (ref 8.9–10.3)
Chloride: 103 mmol/L (ref 98–111)
Creatinine, Ser: 4.87 mg/dL — ABNORMAL HIGH (ref 0.61–1.24)
GFR, Estimated: 11 mL/min — ABNORMAL LOW (ref >60)
Glucose, Bld: 76 mg/dL (ref 70–99)
Phosphorus: 4.9 mg/dL — ABNORMAL HIGH (ref 2.5–4.6)
Potassium: 3.6 mmol/L (ref 3.5–5.1)
Sodium: 142 mmol/L (ref 135–145)

## 2021-01-06 LAB — FERRITIN: Ferritin: 103 ng/mL (ref 24–336)

## 2021-01-06 LAB — C-REACTIVE PROTEIN: CRP: 12.4 mg/dL — ABNORMAL HIGH (ref <1.0)

## 2021-01-06 MED ORDER — CALCIUM GLUCONATE-NACL 1-0.675 GM/50ML-% IV SOLN
1.0000 g | Freq: Once | INTRAVENOUS | Status: AC
  Administered 2021-01-06: 1000 mg via INTRAVENOUS
  Filled 2021-01-06: qty 50

## 2021-01-06 NOTE — Progress Notes (Signed)
HR 118 metoprolol given. MD and charge nurse made aware of yellow mews. Pt does not appear to be in any distress will continue to monitor.

## 2021-01-06 NOTE — Progress Notes (Signed)
   01/06/21 1036  Vitals  BP 131/71  Pulse Rate (!) 118  MEWS COLOR  MEWS Score Color Yellow  Oxygen Therapy  SpO2 94 %  O2 Device Room Air  Pain Assessment  Pain Scale 0-10  Pain Score 0  MEWS Score  MEWS Temp 0  MEWS Systolic 0  MEWS Pulse 2  MEWS RR 0  MEWS LOC 0  MEWS Score 2  Provider Notification  Provider Name/Title Dr Tat  Date Provider Notified 01/06/21  Time Provider Notified 1047  Notification Type Page [facility])  Notification Reason Other (Comment) (HR 118 yellow mews)

## 2021-01-06 NOTE — Progress Notes (Signed)
PROGRESS NOTE  [name] NTZ:[id number] DOB: 1941-01-25 DOA: 12/23/2020 PCP: [name], MD    Brief History: [name] a 80 y.o.malewith medical history significant fortype 2 diabetes, CKD stage IIIb, hypertension, dyslipidemia, diastolic CHF, and CAD described to the ED via EMS with increasing weakness and confusion along with poor appetite for the last 2 to 3 days.Patient was admitted with AKI and severe acidosis along with hyperglycemia. He continues to have persistent AKI with uremic encephalopathy. He is also noted to be COVID-positive and has been started on remdesivir.Patient remained stable on RA. He received 3 days remdesivir. Unfortunately, he remained confused and his renal function continued to deteriorate. Nephrology was consulted. A temporary HD catheter was place on 12/29/20 and patient was initiated on HD on 12/29/20 and 5/28. Now, the patient is non-oliguric with close observation for renal recovery. With HD, his mental status improved.  Assessment/Plan: Generalized weakness with acute metabolic encephalopathy -secondary to AKI with severe metabolic acidosis and uncompensated respiratory alkalosis -secondary to uremic encephalopathy. -acidosis due to AKI; -mentationimproving andstable; patient cooperative with examination and oriented x3..  Non-oliguricAKIon CKD3b -baseline creatinine 1.7-2.0 -due to ischemic ATN in the setting of prerenal with dehydration, volume depletion, hypotension and acute COVID infection. -Renal ultrasoundwithout hydronephrosis -avoiding nephrotoxic agents, hypotension and the use of contrast. -General surgery placed left internal jugular HD catheter 12/29/2020; -First dialysis provided on 12/29/2020, overall well-tolerated. Second dialysis treatment much better tolerated on 12/30/2020.  -continuemonitoring for renal recovery -ContinueIVF per nephrology  Hypocalcemia -Continue to  follow electrolytes trend -Continue telemetry monitoring. -Continue Tums as per nephrology recommendation. -add vitamin D -01/05/21--calcium gluconate added  Hyperglycemiain setting of DM2with mild DKA -resolved -Compliant with home medications according to wife at bedside -Hemoglobin A1c7.0% demonstrating good control. -Continue sliding scale insulin and follow CBGs fluctuation -add novolog 3 units with meals  COVID-19 infection -Chest x-ray with stable cardiomegaly and no other signs of infiltrate -Patient received 3 days of remdesivir for asymptomatic COVID-19 infection. -stable on RA -Continue isolation precautionsand continue practicing diet 3 W's (Wait 6 feet apart, Wash hands frequently and Wear a mask)at time of discharge.  Thrombocytopenia -Overallstable; likely due to acute medical illness -Continue to monitor platelet count trend. -Continue avoiding heparin agents -B12--634 -folate--24.5 -TSH--1.477  Hypertension/CAD -continue metoprolol -no chest pain -12/31/20 Echo--EF 60-65%, no WMA, G1DD  Very Hard of hearing -Patient uses hearing aids at baseline.  A. Fib with RVR, type unspecified -No sustained and associated with hemodialysis initiation -After resumption ofmetoprololand dose adjustment provided; patient has remained normal rate and sinus rhythm. -Denies palpitations, chest pain and shortness of breath. -12/31/20 2D echo EF 60-65%, no WMA, G1DD No significant valvular disorder appreciated.  Physical deconditioning -Physical therapy has seen patient and recommended to skilled nursing facility at discharge for rehabilitation.     Status is: Inpatient  Remains inpatient appropriate because:IV treatments appropriate due to intensity of illness or inability to take PO   Dispo: The patient is from:Home Anticipated d/c is to:SNF Patient currently is not medically stable to d/c. Difficult to  place patient No        Family Communication:spouse updated 6/4  Consultants:renal  Code Status: FULL   DVT Prophylaxis: SCDs   Procedures: As Listed in Progress Note Above  Antibiotics: None    Subjective: Patient denies fevers, chills, headache, chest pain, dyspnea, nausea, vomiting, diarrhea, abdominal pain, dysuria,    Objective: Vitals:   01/06/21 0345  01/06/21 0514 01/06/21 1036 01/06/21 1345  BP:  131/65 131/71 126/65  Pulse:  88 (!) 118 92  Resp:  18  18  Temp:  99.7 F (37.6 C)  99.6 F (37.6 C)  TempSrc:  Oral  Oral  SpO2:  96% 94% 95%  Weight: 96 kg       Intake/Output Summary (Last 24 hours) at 01/06/2021 1451 Last data filed at 01/06/2021 0900 Gross per 24 hour  Intake 580 ml  Output 900 ml  Net -320 ml   Weight change: 0.9 kg Exam:   General:  Pt is alert, follows commands appropriately, not in acute distress  HEENT: No icterus, No thrush, No neck mass, [place]/AT  Cardiovascular: RRR, S1/S2, no rubs, no gallops  Respiratory: CTA bilaterally, no wheezing, no crackles, no rhonchi  Abdomen: Soft/+BS, non tender, non distended, no guarding  Extremities: No edema, No lymphangitis, No petechiae, No rashes, no synovitis   Data Reviewed: I have personally reviewed following labs and imaging studies Basic Metabolic Panel: Recent Labs  Lab 01/02/21 0549 01/03/21 0653 01/04/21 0607 01/05/21 0550 01/06/21 0629  NA 137 137 139 139 142  K 3.5 3.6 3.8 3.6 3.6  CL 103 103 103 103 103  CO2 23 24 24 23 26   GLUCOSE 188* 170* 190* 159* 76  BUN 86* 87* 89* 87* 88*  CREATININE 4.27* 4.41* 4.64* 4.88* 4.87*  CALCIUM 6.5* 6.4* 6.6* 6.3* 6.7*  PHOS 4.6 4.4 4.4 4.7* 4.9*   Liver Function Tests: Recent Labs  Lab 01/02/21 0549 01/03/21 0653 01/04/21 0607 01/05/21 0550 01/06/21 0629  ALBUMIN 1.8* 1.8* 1.7* 1.7* 2.8*   No results for input(s): LIPASE, AMYLASE in the last 168 hours. No results for input(s): AMMONIA in the  last 168 hours. Coagulation Profile: No results for input(s): INR, PROTIME in the last 168 hours. CBC: Recent Labs  Lab 01/06/21 0629  WBC 8.4  HGB 7.1*  HCT 23.6*  MCV 101.7*  PLT 136*   Cardiac Enzymes: No results for input(s): CKTOTAL, CKMB, CKMBINDEX, TROPONINI in the last 168 hours. BNP: Invalid input(s): POCBNP CBG: Recent Labs  Lab 01/05/21 1137 01/05/21 1621 01/05/21 2104 01/06/21 0733 01/06/21 1116  GLUCAP 267* 258* 214* 74 133*   HbA1C: No results for input(s): HGBA1C in the last 72 hours. Urine analysis:    Component Value Date/Time   COLORURINE AMBER (A) 01/05/2021 1722   APPEARANCEUR TURBID (A) 01/05/2021 1722   LABSPEC 1.007 01/05/2021 1722   PHURINE 5.0 01/05/2021 1722   GLUCOSEU >=500 (A) 01/05/2021 1722   HGBUR MODERATE (A) 01/05/2021 1722   BILIRUBINUR NEGATIVE 01/05/2021 1722   KETONESUR NEGATIVE 01/05/2021 1722   PROTEINUR 100 (A) 01/05/2021 1722   UROBILINOGEN 0.2 03/20/2013 2016   NITRITE NEGATIVE 01/05/2021 1722   LEUKOCYTESUR LARGE (A) 01/05/2021 1722   Sepsis Labs: @LABRCNTIP (procalcitonin:4,lacticidven:4) )No results found for this or any previous visit (from the past 240 hour(s)).   Scheduled Meds: . aspirin EC  81 mg Oral Daily  . calcium carbonate  800 mg of elemental calcium Oral TID WC  . Chlorhexidine Gluconate Cloth  6 each Topical Daily  . Chlorhexidine Gluconate Cloth  6 each Topical Q0600  . cholecalciferol  2,000 Units Oral Daily  . darbepoetin (ARANESP) injection - NON-DIALYSIS  100 mcg Subcutaneous Q Tue-1800  . insulin aspart  0-5 Units Subcutaneous QHS  . insulin aspart  0-9 Units Subcutaneous TID WC  . insulin aspart  3 Units Subcutaneous TID WC  . lipase/protease/amylase  12,000 Units Oral  TID AC  . mouth rinse  15 mL Mouth Rinse BID  . metoprolol tartrate  50 mg Oral BID  . saccharomyces boulardii  250 mg Oral BID  . sodium bicarbonate  1,300 mg Oral BID   Continuous Infusions: . sodium chloride    . sodium  chloride      Procedures/Studies: CT Head Wo Contrast  Result Date: 12/23/2020 CLINICAL DATA:  Mental status changes EXAM: CT HEAD WITHOUT CONTRAST TECHNIQUE: Contiguous axial images were obtained from the base of the skull through the vertex without intravenous contrast. COMPARISON:  12/04/2018 FINDINGS: Brain: No acute intracranial abnormality. Specifically, no hemorrhage, hydrocephalus, mass lesion, acute infarction, or significant intracranial injury. There is atrophy and chronic small vessel disease changes. Vascular: No hyperdense vessel or unexpected calcification. Skull: No acute calvarial abnormality. Sinuses/Orbits: No acute findings Other: None IMPRESSION: Atrophy, chronic microvascular disease. No acute intracranial abnormality. Electronically Signed   By: Rolm Baptise M.D.   On: 12/23/2020 12:54   US RENAL  Result Date: 12/24/2020 CLINICAL DATA:  80 year old male with acute renal insufficiency. EXAM: RENAL / URINARY TRACT ULTRASOUND COMPLETE COMPARISON:  Abdomen ultrasound 12/01/2017. CT Abdomen and Pelvis 03/20/2013. FINDINGS: Right Kidney: Renal measurements: 11.8 x 5.3 x 7.5 cm = volume: 247 mL. Cortical echogenicity within normal limits. No hydronephrosis. Extrarenal pelvis is stable (normal variant). No right renal mass. Left Kidney: Renal measurements: 12.9 x 5.6 x 5.9 cm = volume: 221 mL. Cortical echogenicity within normal limits. Lower pole curvilinear hypoechogenicity on image 29 probably corresponds to chronic perinephric stranding seen in 2014. No left hydronephrosis today. Possible punctate chronic lower pole calculus on image 38, stable since 2014. Bladder: Decompressed and poorly visible, reportedly a Foley catheter is in place. Other: Echogenic liver on image 2. IMPRESSION: 1. No hydronephrosis or acute renal finding. Chronic left lower pole nephrolithiasis. 2. Evidence of fatty liver disease. Electronically Signed   By: Genevie Ann M.D.   On: 12/24/2020 11:18   DG Chest Port 1  View  Result Date: 12/29/2020 CLINICAL DATA:  Status post dialysis catheter placement. EXAM: PORTABLE CHEST 1 VIEW COMPARISON:  Single-view of the chest 12/23/2020. FINDINGS: Left IJ approach dialysis catheter is in place with the tip projecting in the lower superior vena cava. No pneumothorax. Elevation of the right hemidiaphragm is unchanged. Lungs are clear. Heart size is upper normal. No acute or focal bony abnormality. IMPRESSION: Dialysis catheter tip projects in the lower superior vena cava. Negative for pneumothorax or acute disease. Electronically Signed   By: Inge Rise M.D.   On: 12/29/2020 11:42   DG Chest Portable 1 View  Result Date: 12/23/2020 CLINICAL DATA:  Weakness. COVID 2 weeks ago. EXAM: PORTABLE CHEST 1 VIEW COMPARISON:  Two-view chest x-ray 12/03/2018 FINDINGS: Heart is chronically enlarged. Chronic elevation of the right hemidiaphragm noted. No edema or effusion is present. No focal airspace disease is present. Axial skeleton is unremarkable. IMPRESSION: No acute cardiopulmonary disease or significant interval change. Stable cardiomegaly without failure. Electronically Signed   By: San Morelle M.D.   On: 12/23/2020 12:33   ECHOCARDIOGRAM COMPLETE  Result Date: 12/31/2020    ECHOCARDIOGRAM REPORT   Patient Name:   Mark TRIER Sr. Date of Exam: 12/31/2020 Medical Rec #:  [id number]            Height:       68.5 in Accession #:    [id number]           Weight:  208.6 lb Date of Birth:  1941-06-05            BSA:          2.092 m Patient Age:    44 years             BP:           146/63 mmHg Patient Gender: M                    HR:           84 bpm. Exam Location:  [facility] Procedure: 2D Echo, Cardiac Doppler and Color Doppler Indications:    Atrial Fibrillation I48.91  History:        Patient has prior history of Echocardiogram examinations, most                 recent 12/12/2011. Risk Factors:Hypertension, Diabetes and                 Dyslipidemia.  Sonographer:     Alvino Chapel RCS Referring Phys: [place]  1. Left ventricular ejection fraction, by estimation, is 60 to 65%. The left ventricle has normal function. The left ventricle has no regional wall motion abnormalities. There is mild left ventricular hypertrophy. Left ventricular diastolic parameters are consistent with Grade I diastolic dysfunction (impaired relaxation).  2. Right ventricular systolic function is normal. The right ventricular size is normal. There is normal pulmonary artery systolic pressure.  3. The mitral valve is normal in structure. No evidence of mitral valve regurgitation. No evidence of mitral stenosis.  4. The aortic valve is tricuspid. Aortic valve regurgitation is not visualized. Mild aortic valve sclerosis is present, with no evidence of aortic valve stenosis.  5. Aortic dilatation noted. There is mild dilatation of the aortic root, measuring 39 mm.  6. The inferior vena cava is normal in size with greater than 50% respiratory variability, suggesting right atrial pressure of 3 mmHg. FINDINGS  Left Ventricle: Left ventricular ejection fraction, by estimation, is 60 to 65%. The left ventricle has normal function. The left ventricle has no regional wall motion abnormalities. The left ventricular internal cavity size was normal in size. There is  mild left ventricular hypertrophy. Left ventricular diastolic parameters are consistent with Grade I diastolic dysfunction (impaired relaxation). Right Ventricle: The right ventricular size is normal. Right ventricular systolic function is normal. There is normal pulmonary artery systolic pressure. The tricuspid regurgitant velocity is 2.82 m/s, and with an assumed right atrial pressure of 3 mmHg,  the estimated right ventricular systolic pressure is 50.5 mmHg. Left Atrium: Left atrial size was normal in size. Right Atrium: Right atrial size was normal in size. Pericardium: There is no evidence of pericardial effusion. Mitral  Valve: The mitral valve is normal in structure. Mild mitral annular calcification. No evidence of mitral valve regurgitation. No evidence of mitral valve stenosis. Tricuspid Valve: The tricuspid valve is normal in structure. Tricuspid valve regurgitation is mild . No evidence of tricuspid stenosis. Aortic Valve: The aortic valve is tricuspid. Aortic valve regurgitation is not visualized. Mild aortic valve sclerosis is present, with no evidence of aortic valve stenosis. Pulmonic Valve: The pulmonic valve was normal in structure. Pulmonic valve regurgitation is not visualized. No evidence of pulmonic stenosis. Aorta: Aortic dilatation noted. There is mild dilatation of the aortic root, measuring 39 mm. Venous: The inferior vena cava is normal in size with greater than 50% respiratory variability, suggesting right atrial pressure of  3 mmHg.  LEFT VENTRICLE PLAX 2D LVIDd:         4.80 cm  Diastology LVIDs:         3.30 cm  LV e' medial:    5.66 cm/s LV PW:         1.10 cm  LV E/e' medial:  17.0 LV IVS:        1.50 cm  LV e' lateral:   8.81 cm/s LVOT diam:     2.10 cm  LV E/e' lateral: 10.9 LV SV:         85 LV SV Index:   40 LVOT Area:     3.46 cm  RIGHT VENTRICLE RV S prime:     12.00 cm/s TAPSE (M-mode): 2.1 cm LEFT ATRIUM             Index       RIGHT ATRIUM           Index LA diam:        3.00 cm 1.43 cm/m  RA Area:     16.10 cm LA Vol (A2C):   50.1 ml 23.95 ml/m RA Volume:   43.20 ml  20.65 ml/m LA Vol (A4C):   60.2 ml 28.78 ml/m LA Biplane Vol: 56.1 ml 26.82 ml/m  AORTIC VALVE LVOT Vmax:   110.00 cm/s LVOT Vmean:  79.300 cm/s LVOT VTI:    0.244 m  AORTA Ao Root diam: 3.90 cm MITRAL VALVE                TRICUSPID VALVE MV Area (PHT): 4.08 cm     TR Peak grad:   31.8 mmHg MV Decel Time: 186 msec     TR Vmax:        282.00 cm/s MV E velocity: 96.00 cm/s MV A velocity: 117.00 cm/s  SHUNTS MV E/A ratio:  0.82         Systemic VTI:  0.24 m                             Systemic Diam: 2.10 cm Kirk Ruths MD  Electronically signed by Kirk Ruths MD Signature Date/Time: 12/31/2020/10:23:47 AM    Final     Orson Eva, DO  [facility]  If 7PM-7AM, please contact night-coverage [email or web address] Password [email or web address] 01/06/2021, 2:51 PM   LOS: 14 days

## 2021-01-07 DIAGNOSIS — I471 Supraventricular tachycardia: Secondary | ICD-10-CM

## 2021-01-07 LAB — RENAL FUNCTION PANEL
Albumin: 2.3 g/dL — ABNORMAL LOW (ref 3.5–5.0)
Anion gap: 12 (ref 5–15)
BUN: 92 mg/dL — ABNORMAL HIGH (ref 8–23)
CO2: 25 mmol/L (ref 22–32)
Calcium: 6.5 mg/dL — ABNORMAL LOW (ref 8.9–10.3)
Chloride: 103 mmol/L (ref 98–111)
Creatinine, Ser: 4.77 mg/dL — ABNORMAL HIGH (ref 0.61–1.24)
GFR, Estimated: 12 mL/min — ABNORMAL LOW (ref >60)
Glucose, Bld: 164 mg/dL — ABNORMAL HIGH (ref 70–99)
Phosphorus: 5.2 mg/dL — ABNORMAL HIGH (ref 2.5–4.6)
Potassium: 3.9 mmol/L (ref 3.5–5.1)
Sodium: 140 mmol/L (ref 135–145)

## 2021-01-07 LAB — C-REACTIVE PROTEIN: CRP: 15.3 mg/dL — ABNORMAL HIGH (ref <1.0)

## 2021-01-07 LAB — GLUCOSE, CAPILLARY
Glucose-Capillary: 147 mg/dL — ABNORMAL HIGH (ref 70–99)
Glucose-Capillary: 296 mg/dL — ABNORMAL HIGH (ref 70–99)
Glucose-Capillary: 210 mg/dL — ABNORMAL HIGH (ref 70–99)
Glucose-Capillary: 152 mg/dL — ABNORMAL HIGH (ref 70–99)

## 2021-01-07 LAB — CBC
HCT: 21.7 % — ABNORMAL LOW (ref 39.0–52.0)
Hemoglobin: 6.5 g/dL — CL (ref 13.0–17.0)
MCH: 29.7 pg (ref 26.0–34.0)
MCHC: 30 g/dL (ref 30.0–36.0)
MCV: 99.1 fL (ref 80.0–100.0)
Platelets: 139 10*3/uL — ABNORMAL LOW (ref 150–400)
RBC: 2.19 MIL/uL — ABNORMAL LOW (ref 4.22–5.81)
RDW: 13.2 % (ref 11.5–15.5)
WBC: 9.5 10*3/uL (ref 4.0–10.5)
nRBC: 0 % (ref 0.0–0.2)

## 2021-01-07 LAB — FERRITIN: Ferritin: 114 ng/mL (ref 24–336)

## 2021-01-07 LAB — PREPARE RBC (CROSSMATCH)

## 2021-01-07 MED ORDER — DILTIAZEM HCL 30 MG PO TABS: 30.0000 mg | ORAL_TABLET | Freq: Four times a day (QID) | ORAL | Status: DC

## 2021-01-07 MED ORDER — SODIUM CHLORIDE 0.9% IV SOLUTION: Freq: Once | INTRAVENOUS | Status: DC

## 2021-01-07 MED ORDER — DILTIAZEM HCL ER COATED BEADS 120 MG PO CP24
120.0000 mg | ORAL_CAPSULE | Freq: Every day | ORAL | Status: DC
  Administered 2021-01-07 – 2021-01-11 (×5): 120 mg via ORAL
  Filled 2021-01-07 (×5): qty 1

## 2021-01-07 MED ORDER — CALCIUM GLUCONATE-NACL 1-0.675 GM/50ML-% IV SOLN
1.0000 g | Freq: Once | INTRAVENOUS | Status: AC
  Administered 2021-01-07: 1000 mg via INTRAVENOUS
  Filled 2021-01-07: qty 50

## 2021-01-07 NOTE — Progress Notes (Signed)
PROGRESS NOTE  [name] CWC:[id number] DOB: 06-01-1941 DOA: 12/23/2020 PCP: [name], MD Brief History: Mark Dicksonis a 80 y.o.malewith medical history significant fortype 2 diabetes, CKD stage IIIb, hypertension, dyslipidemia, diastolic CHF, and CAD described to the ED via EMS with increasing weakness and confusion along with poor appetite for the last 2 to 3 days.Patient was admitted with AKI and severe acidosis along with hyperglycemia. He continues to have persistent AKI with uremic encephalopathy. He is also noted to be COVID-positive and has been started on remdesivir.Patient remained stable on RA. He received 3 days remdesivir. Unfortunately, he remained confused and his renal function continued to deteriorate. Nephrology was consulted. A temporary HD catheter was place on 12/29/20 and patient was initiated on HD on 12/29/20 and 5/28. Now, the patient is non-oliguric with close observation for renal recovery. With HD, his mental status improved.  Assessment/Plan: Generalized weakness with acute metabolic encephalopathy -secondary to AKI with severe metabolic acidosis and uncompensated respiratory alkalosis -secondary to uremic encephalopathy. -acidosis due to AKI; -mentationimproving andstable; patient cooperative with examination and oriented x3..  Non-oliguricAKIon CKD3b -baseline creatinine 1.7-2.0 -due to ischemic ATN in the setting of prerenal with dehydration, volume depletion, hypotension and acute COVID infection. -Renal ultrasoundwithout hydronephrosis -avoiding nephrotoxic agents, hypotension and the use of contrast. -General surgery placed left internal jugular HD catheter 12/29/2020; -First dialysis provided on 12/29/2020, overall well-tolerated. Second dialysis treatment much better tolerated on 12/30/2020.  -continuemonitoring for renal recovery -ContinueIVF per nephrology  Hypocalcemia -Continue to follow  electrolytes trend -Continue telemetry monitoring. -Continue Tums as per nephrology recommendation. -added vitamin D -01/05/21--calcium gluconate added  Anemia of CKD -Hgb slowly down -check FOBT -check iron studies -V61 607 -folic 37.1 -transfuse 2 units PRBC  Hyperglycemiain setting of DM2with mild DKA -resolved -Compliant with home medications according to wife at bedside -Hemoglobin A1c7.0% demonstrating good control. -Continue sliding scale insulin and follow CBGs fluctuation -added novolog 3 units with meals  COVID-19 infection -Chest x-ray with stable cardiomegaly and no other signs of infiltrate -Patient received 3 days of remdesivir for asymptomatic COVID-19 infection. -stable on RA -Continue isolation precautionsand continue practicing diet 3 W's (Wait 6 feet apart, Wash hands frequently and Wear a mask)at time of discharge.  Thrombocytopenia -Overallstable; likely due to acute medical illness -Continue to monitor platelet count trend. -Continue avoiding heparin agents -B12--634 -folate--24.5 -TSH--1.477  Hypertension/CAD -continue metoprolol -no chest pain -12/31/20 Echo--EF 60-65%, no WMA, G1DD  Very Hard of hearing -Patient uses hearing aids at baseline.  SVT -No sustained and associated with hemodialysis initiation -pt continues to have short episodes of SVT into 120s -12/31/20 2D echo EF 60-65%, no WMA, G1DD No significant valvular disorder appreciated. -continue metoprolol -start diltiazem  Physical deconditioning -Physical therapy has seen patient and recommended to skilled nursing facility at discharge for rehabilitation.     Status is: Inpatient  Remains inpatient appropriate because:IV treatments appropriate due to intensity of illness or inability to take PO   Dispo: The patient is from:Home Anticipated d/c is to:SNF Patient currently is not medically stable to d/c. Difficult  to place patient No        Family Communication:spouse updated 6/5  Consultants:renal  Code Status: FULL   DVT Prophylaxis: SCDs   Procedures: As Listed in Progress Note Above  Antibiotics: None   Subjective: Patient denies fevers, chills, headache, chest pain, dyspnea, nausea, vomiting, diarrhea, abdominal pain, dysuria, hematuria,    Objective: Vitals:  01/06/21 1345 01/06/21 2208 01/07/21 0546 01/07/21 1504  BP: 126/65 (!) 145/68 (!) 150/83 (!) 150/71  Pulse: 92 91 (!) 108 87  Resp: 18 18 18 18   Temp: 99.6 F (37.6 C) 100.2 F (37.9 C) 98.6 F (37 C) 98.4 F (36.9 C)  TempSrc: Oral Oral Oral Oral  SpO2: 95% 92% 91% 96%  Weight:   94.6 kg     Intake/Output Summary (Last 24 hours) at 01/07/2021 1508 Last data filed at 01/07/2021 1200 Gross per 24 hour  Intake 1070 ml  Output 1500 ml  Net -430 ml   Weight change: -1.4 kg Exam:   General:  Pt is alert, follows commands appropriately, not in acute distress  HEENT: No icterus, No thrush, No neck mass, [place]/AT  Cardiovascular: RRR, S1/S2, no rubs, no gallops  Respiratory: bibasilar rales. No wheeze  Abdomen: Soft/+BS, non tender, non distended, no guarding  Extremities: No edema, No lymphangitis, No petechiae, No rashes, no synovitis   Data Reviewed: I have personally reviewed following labs and imaging studies Basic Metabolic Panel: Recent Labs  Lab 01/03/21 0653 01/04/21 0607 01/05/21 0550 01/06/21 0629 01/07/21 0510  NA 137 139 139 142 140  K 3.6 3.8 3.6 3.6 3.9  CL 103 103 103 103 103  CO2 24 24 23 26 25   GLUCOSE 170* 190* 159* 76 164*  BUN 87* 89* 87* 88* 92*  CREATININE 4.41* 4.64* 4.88* 4.87* 4.77*  CALCIUM 6.4* 6.6* 6.3* 6.7* 6.5*  PHOS 4.4 4.4 4.7* 4.9* 5.2*   Liver Function Tests: Recent Labs  Lab 01/03/21 0653 01/04/21 0607 01/05/21 0550 01/06/21 0629 01/07/21 0510  ALBUMIN 1.8* 1.7* 1.7* 2.8* 2.3*   No results for input(s): LIPASE, AMYLASE in the  last 168 hours. No results for input(s): AMMONIA in the last 168 hours. Coagulation Profile: No results for input(s): INR, PROTIME in the last 168 hours. CBC: Recent Labs  Lab 01/06/21 0629 01/07/21 1008  WBC 8.4 9.5  HGB 7.1* 6.5*  HCT 23.6* 21.7*  MCV 101.7* 99.1  PLT 136* 139*   Cardiac Enzymes: No results for input(s): CKTOTAL, CKMB, CKMBINDEX, TROPONINI in the last 168 hours. BNP: Invalid input(s): POCBNP CBG: Recent Labs  Lab 01/06/21 1116 01/06/21 1601 01/06/21 2205 01/07/21 0755 01/07/21 1143  GLUCAP 133* 122* 157* 147* 296*   HbA1C: No results for input(s): HGBA1C in the last 72 hours. Urine analysis:    Component Value Date/Time   COLORURINE AMBER (A) 01/05/2021 1722   APPEARANCEUR TURBID (A) 01/05/2021 1722   LABSPEC 1.007 01/05/2021 1722   PHURINE 5.0 01/05/2021 1722   GLUCOSEU >=500 (A) 01/05/2021 1722   HGBUR MODERATE (A) 01/05/2021 1722   BILIRUBINUR NEGATIVE 01/05/2021 1722   KETONESUR NEGATIVE 01/05/2021 1722   PROTEINUR 100 (A) 01/05/2021 1722   UROBILINOGEN 0.2 03/20/2013 2016   NITRITE NEGATIVE 01/05/2021 1722   LEUKOCYTESUR LARGE (A) 01/05/2021 1722   Sepsis Labs: @LABRCNTIP (procalcitonin:4,lacticidven:4) ) Recent Results (from the past 240 hour(s))  Culture, Urine     Status: Abnormal (Preliminary result)   Collection Time: 01/05/21  5:22 PM   Specimen: Urine, Clean Catch  Result Value Ref Range Status   Specimen Description   Final    URINE, CLEAN CATCH Performed at [facility], [address] [place], [place] [address]    Special Requests   Final    NONE Performed at [facility], [address], [place],  [address]    Culture (A)  Final    >=100,000 COLONIES/mL ESCHERICHIA COLI SUSCEPTIBILITIES TO  FOLLOW Performed at [facility], [place] [address] [place], [place] [address]    Report Status PENDING  Incomplete     Scheduled Meds: . sodium chloride   Intravenous Once  . aspirin EC  81 mg Oral Daily  .  calcium carbonate  800 mg of elemental calcium Oral TID WC  . Chlorhexidine Gluconate Cloth  6 each Topical Daily  . Chlorhexidine Gluconate Cloth  6 each Topical Q0600  . cholecalciferol  2,000 Units Oral Daily  . darbepoetin (ARANESP) injection - NON-DIALYSIS  100 mcg Subcutaneous Q Tue-1800  . insulin aspart  0-5 Units Subcutaneous QHS  . insulin aspart  0-9 Units Subcutaneous TID WC  . insulin aspart  3 Units Subcutaneous TID WC  . lipase/protease/amylase  12,000 Units Oral TID AC  . mouth rinse  15 mL Mouth Rinse BID  . metoprolol tartrate  50 mg Oral BID  . saccharomyces boulardii  250 mg Oral BID  . sodium bicarbonate  1,300 mg Oral BID   Continuous Infusions: . sodium chloride    . sodium chloride      Procedures/Studies: CT Head Wo Contrast  Result Date: 12/23/2020 CLINICAL DATA:  Mental status changes EXAM: CT HEAD WITHOUT CONTRAST TECHNIQUE: Contiguous axial images were obtained from the base of the skull through the vertex without intravenous contrast. COMPARISON:  12/04/2018 FINDINGS: Brain: No acute intracranial abnormality. Specifically, no hemorrhage, hydrocephalus, mass lesion, acute infarction, or significant intracranial injury. There is atrophy and chronic small vessel disease changes. Vascular: No hyperdense vessel or unexpected calcification. Skull: No acute calvarial abnormality. Sinuses/Orbits: No acute findings Other: None IMPRESSION: Atrophy, chronic microvascular disease. No acute intracranial abnormality. Electronically Signed   By: Rolm Baptise M.D.   On: 12/23/2020 12:54   US RENAL  Result Date: 12/24/2020 CLINICAL DATA:  80 year old male with acute renal insufficiency. EXAM: RENAL / URINARY TRACT ULTRASOUND COMPLETE COMPARISON:  Abdomen ultrasound 12/01/2017. CT Abdomen and Pelvis 03/20/2013. FINDINGS: Right Kidney: Renal measurements: 11.8 x 5.3 x 7.5 cm = volume: 247 mL. Cortical echogenicity within normal limits. No hydronephrosis. Extrarenal pelvis is  stable (normal variant). No right renal mass. Left Kidney: Renal measurements: 12.9 x 5.6 x 5.9 cm = volume: 221 mL. Cortical echogenicity within normal limits. Lower pole curvilinear hypoechogenicity on image 29 probably corresponds to chronic perinephric stranding seen in 2014. No left hydronephrosis today. Possible punctate chronic lower pole calculus on image 38, stable since 2014. Bladder: Decompressed and poorly visible, reportedly a Foley catheter is in place. Other: Echogenic liver on image 2. IMPRESSION: 1. No hydronephrosis or acute renal finding. Chronic left lower pole nephrolithiasis. 2. Evidence of fatty liver disease. Electronically Signed   By: Genevie Ann M.D.   On: 12/24/2020 11:18   DG Chest Port 1 View  Result Date: 12/29/2020 CLINICAL DATA:  Status post dialysis catheter placement. EXAM: PORTABLE CHEST 1 VIEW COMPARISON:  Single-view of the chest 12/23/2020. FINDINGS: Left IJ approach dialysis catheter is in place with the tip projecting in the lower superior vena cava. No pneumothorax. Elevation of the right hemidiaphragm is unchanged. Lungs are clear. Heart size is upper normal. No acute or focal bony abnormality. IMPRESSION: Dialysis catheter tip projects in the lower superior vena cava. Negative for pneumothorax or acute disease. Electronically Signed   By: Inge Rise M.D.   On: 12/29/2020 11:42   DG Chest Portable 1 View  Result Date: 12/23/2020 CLINICAL DATA:  Weakness. COVID 2 weeks ago. EXAM: PORTABLE CHEST 1 VIEW COMPARISON:  Two-view chest x-ray 12/03/2018 FINDINGS: Heart is chronically enlarged. Chronic elevation of the right hemidiaphragm noted. No edema or effusion is present. No focal airspace disease is present. Axial skeleton is unremarkable. IMPRESSION: No acute cardiopulmonary disease or significant interval change. Stable cardiomegaly without failure. Electronically Signed   By: San Morelle M.D.   On: 12/23/2020 12:33   ECHOCARDIOGRAM COMPLETE  Result  Date: 12/31/2020    ECHOCARDIOGRAM REPORT   Patient Name:   MAKOTO SELLITTO Sr. Date of Exam: 12/31/2020 Medical Rec #:  [id number]            Height:       68.5 in Accession #:    [id number]           Weight:       208.6 lb Date of Birth:  1941-04-07            BSA:          2.092 m Patient Age:    1 years             BP:           146/63 mmHg Patient Gender: M                    HR:           84 bpm. Exam Location:  [facility] Procedure: 2D Echo, Cardiac Doppler and Color Doppler Indications:    Atrial Fibrillation I48.91  History:        Patient has prior history of Echocardiogram examinations, most                 recent 12/12/2011. Risk Factors:Hypertension, Diabetes and                 Dyslipidemia.  Sonographer:    Alvino Chapel RCS Referring Phys: [place]  1. Left ventricular ejection fraction, by estimation, is 60 to 65%. The left ventricle has normal function. The left ventricle has no regional wall motion abnormalities. There is mild left ventricular hypertrophy. Left ventricular diastolic parameters are consistent with Grade I diastolic dysfunction (impaired relaxation).  2. Right ventricular systolic function is normal. The right ventricular size is normal. There is normal pulmonary artery systolic pressure.  3. The mitral valve is normal in structure. No evidence of mitral valve regurgitation. No evidence of mitral stenosis.  4. The aortic valve is tricuspid. Aortic valve regurgitation is not visualized. Mild aortic valve sclerosis is present, with no evidence of aortic valve stenosis.  5. Aortic dilatation noted. There is mild dilatation of the aortic root, measuring 39 mm.  6. The inferior vena cava is normal in size with greater than 50% respiratory variability, suggesting right atrial pressure of 3 mmHg. FINDINGS  Left Ventricle: Left ventricular ejection fraction, by estimation, is 60 to 65%. The left ventricle has normal function. The left ventricle has no regional wall  motion abnormalities. The left ventricular internal cavity size was normal in size. There is  mild left ventricular hypertrophy. Left ventricular diastolic parameters are consistent with Grade I diastolic dysfunction (impaired relaxation). Right Ventricle: The right ventricular size is normal. Right ventricular systolic function is normal. There is normal pulmonary artery systolic pressure. The tricuspid regurgitant velocity is 2.82 m/s, and with an assumed right atrial pressure of 3 mmHg,  the estimated right ventricular systolic pressure is 71.2 mmHg. Left Atrium: Left atrial size was normal in size. Right Atrium: Right atrial size was normal in size.  Pericardium: There is no evidence of pericardial effusion. Mitral Valve: The mitral valve is normal in structure. Mild mitral annular calcification. No evidence of mitral valve regurgitation. No evidence of mitral valve stenosis. Tricuspid Valve: The tricuspid valve is normal in structure. Tricuspid valve regurgitation is mild . No evidence of tricuspid stenosis. Aortic Valve: The aortic valve is tricuspid. Aortic valve regurgitation is not visualized. Mild aortic valve sclerosis is present, with no evidence of aortic valve stenosis. Pulmonic Valve: The pulmonic valve was normal in structure. Pulmonic valve regurgitation is not visualized. No evidence of pulmonic stenosis. Aorta: Aortic dilatation noted. There is mild dilatation of the aortic root, measuring 39 mm. Venous: The inferior vena cava is normal in size with greater than 50% respiratory variability, suggesting right atrial pressure of 3 mmHg.  LEFT VENTRICLE PLAX 2D LVIDd:         4.80 cm  Diastology LVIDs:         3.30 cm  LV e' medial:    5.66 cm/s LV PW:         1.10 cm  LV E/e' medial:  17.0 LV IVS:        1.50 cm  LV e' lateral:   8.81 cm/s LVOT diam:     2.10 cm  LV E/e' lateral: 10.9 LV SV:         85 LV SV Index:   40 LVOT Area:     3.46 cm  RIGHT VENTRICLE RV S prime:     12.00 cm/s TAPSE  (M-mode): 2.1 cm LEFT ATRIUM             Index       RIGHT ATRIUM           Index LA diam:        3.00 cm 1.43 cm/m  RA Area:     16.10 cm LA Vol (A2C):   50.1 ml 23.95 ml/m RA Volume:   43.20 ml  20.65 ml/m LA Vol (A4C):   60.2 ml 28.78 ml/m LA Biplane Vol: 56.1 ml 26.82 ml/m  AORTIC VALVE LVOT Vmax:   110.00 cm/s LVOT Vmean:  79.300 cm/s LVOT VTI:    0.244 m  AORTA Ao Root diam: 3.90 cm MITRAL VALVE                TRICUSPID VALVE MV Area (PHT): 4.08 cm     TR Peak grad:   31.8 mmHg MV Decel Time: 186 msec     TR Vmax:        282.00 cm/s MV E velocity: 96.00 cm/s MV A velocity: 117.00 cm/s  SHUNTS MV E/A ratio:  0.82         Systemic VTI:  0.24 m                             Systemic Diam: 2.10 cm Kirk Ruths MD Electronically signed by Kirk Ruths MD Signature Date/Time: 12/31/2020/10:23:47 AM    Final     Orson Eva, DO  [facility]  If 7PM-7AM, please contact night-coverage [email or web address] Password [email or web address] 01/07/2021, 3:08 PM   LOS: 15 days

## 2021-01-07 NOTE — TOC Progression Note (Addendum)
Transition of Care (TOC) - Progression Note    Patient Details  Name: Mark MANGIERI Sr. MRN: [id number] Date of Birth: 1940-08-19  Transition of Care [facility]) CM/SW Contact  Natasha Bence, LCSW Phone Number: 01/07/2021, 12:37 PM  Clinical Narrative:    CSW started auth for SNF and ambulance for patient. TOC to follow.   Expected Discharge Plan: Skilled Nursing Facility Barriers to Discharge: Continued Medical Work up  Expected Discharge Plan and Services Expected Discharge Plan: [place] Choice: [place] arrangements for the past 2 months: Single Family Home                                       Social Determinants of Health (SDOH) Interventions    Readmission Risk Interventions Readmission Risk Prevention Plan 12/04/2018  Transportation Screening Complete  PCP or Specialist Appt within 3-5 Days (No Data)  [place] or [place] Complete  Social Work Consult for [place] Planning/Counseling Complete  Palliative Care Screening Not Applicable  Medication Review Press photographer) Complete  Some recent data might be hidden

## 2021-01-07 NOTE — Progress Notes (Signed)
Date and time results received: 01/07/21 1026   Test: hemoglobin Critical Value: 6.5  Name of Provider Notified: Tat

## 2021-01-07 NOTE — Progress Notes (Signed)
Discussed consent for blood transfusion with patient, and patient expressed discomfort with signing and receiving blood. Stated he would prefer to wait and speak to his wife. Patient states he will let RN know when he does.

## 2021-01-08 LAB — URINE CULTURE: Culture: >=100000 — AB

## 2021-01-08 LAB — CBC
HCT: 30.3 % — ABNORMAL LOW (ref 39.0–52.0)
Hemoglobin: 9.5 g/dL — ABNORMAL LOW (ref 13.0–17.0)
MCH: 30.6 pg (ref 26.0–34.0)
MCHC: 31.4 g/dL (ref 30.0–36.0)
MCV: 97.7 fL (ref 80.0–100.0)
Platelets: 145 10*3/uL — ABNORMAL LOW (ref 150–400)
RBC: 3.1 MIL/uL — ABNORMAL LOW (ref 4.22–5.81)
RDW: 14.1 % (ref 11.5–15.5)
WBC: 9.6 10*3/uL (ref 4.0–10.5)
nRBC: 0 % (ref 0.0–0.2)

## 2021-01-08 LAB — RETICULOCYTES
Immature Retic Fract: 15.8 % (ref 2.3–15.9)
RBC.: 3.09 MIL/uL — ABNORMAL LOW (ref 4.22–5.81)
Retic Count, Absolute: 51.6 10*3/uL (ref 19.0–186.0)
Retic Ct Pct: 1.7 % (ref 0.4–3.1)

## 2021-01-08 LAB — RENAL FUNCTION PANEL
Albumin: 2.2 g/dL — ABNORMAL LOW (ref 3.5–5.0)
Anion gap: 13 (ref 5–15)
BUN: 92 mg/dL — ABNORMAL HIGH (ref 8–23)
CO2: 27 mmol/L (ref 22–32)
Calcium: 6.6 mg/dL — ABNORMAL LOW (ref 8.9–10.3)
Chloride: 102 mmol/L (ref 98–111)
Creatinine, Ser: 4.69 mg/dL — ABNORMAL HIGH (ref 0.61–1.24)
GFR, Estimated: 12 mL/min — ABNORMAL LOW (ref >60)
Glucose, Bld: 192 mg/dL — ABNORMAL HIGH (ref 70–99)
Phosphorus: 4.8 mg/dL — ABNORMAL HIGH (ref 2.5–4.6)
Potassium: 4 mmol/L (ref 3.5–5.1)
Sodium: 142 mmol/L (ref 135–145)

## 2021-01-08 LAB — IRON AND TIBC
Iron: 21 ug/dL — ABNORMAL LOW (ref 45–182)
Saturation Ratios: 18 % (ref 17.9–39.5)
TIBC: 116 ug/dL — ABNORMAL LOW (ref 250–450)
UIBC: 95 ug/dL

## 2021-01-08 LAB — GLUCOSE, CAPILLARY
Glucose-Capillary: 182 mg/dL — ABNORMAL HIGH (ref 70–99)
Glucose-Capillary: 185 mg/dL — ABNORMAL HIGH (ref 70–99)

## 2021-01-08 LAB — OCCULT BLOOD X 1 CARD TO LAB, STOOL: Fecal Occult Bld: NEGATIVE

## 2021-01-08 LAB — FERRITIN: Ferritin: 139 ng/mL (ref 24–336)

## 2021-01-08 LAB — C-REACTIVE PROTEIN: CRP: 15.9 mg/dL — ABNORMAL HIGH (ref <1.0)

## 2021-01-08 MED ORDER — ALTEPLASE 2 MG IJ SOLR
2.0000 mg | Freq: Once | INTRAMUSCULAR | Status: DC
  Filled 2021-01-08 (×2): qty 2

## 2021-01-08 MED ORDER — HEPARIN SODIUM (PORCINE) 1000 UNIT/ML DIALYSIS
40.0000 [IU]/kg | INTRAMUSCULAR | Status: DC | PRN
  Filled 2021-01-08: qty 4

## 2021-01-08 NOTE — Progress Notes (Addendum)
Patient ID: Mark Hose., male   DOB: [birth year], 80 y.o.   MRN: [id number] [facility] Progress Note   Assessment/ Plan:   1. Acute kidney Injury on chronic kidney disease stage IIIb (baseline creatinine 1.7-2.0): Suspected to have ATN associated with COVID-19 infection and volume depletion.  He is nonoliguric with essentially fixed BUN/creatinine over the past 3 to 4 days suggesting that this is likely the plateau phase of ATN.  Earlier received hemodialysis for management of suspected uremic encephalopathy and has persistent azotemia at this time without evidence of clear renal recovery-I will get him dialyzed today if staffing/logistics allow otherwise tomorrow for management of azotemia and to see if it translates to improvement of encephalopathy. 2.  Acute metabolic encephalopathy: I do not know him at baseline but from my brief conversation with him, he continues to have confusion and some word finding difficulty which may or may not be related to his azotemia- monitor response to hemodialysis.   3.  Hypertension: Blood pressure currently within acceptable range 4.  Anemia of acute/critical illness: Hemoglobin and hematocrit improved overnight with PRBC transfusion, no overt losses noted. 5.  Metabolic acidosis: Bicarbonate levels regulated/within normal range.  We will discontinue oral sodium bicarbonate to prevent alkalemia.  Subjective:   No acute events overnight, transfused for significant anemia.   Objective:   BP 135/70   Pulse 76   Temp 98.2 F (36.8 C) (Oral)   Resp 20   Wt 92.5 kg   SpO2 96%   BMI 30.56 kg/m   Intake/Output Summary (Last 24 hours) at 01/08/2021 0827 Last data filed at 01/08/2021 0500 Gross per 24 hour  Intake 2372 ml  Output 1400 ml  Net 972 ml   Weight change: -2.1 kg  Physical Exam: Gen: Appears comfortable resting in bed, somewhat confused and disoriented CVS: Pulse regular rhythm, normal rate, S1 and S2 normal Resp:  Anteriorly clear to auscultation bilaterally, no distinct rales or rhonchi Abd: Soft, obese, nontender, bowel sounds normal Ext: Trace lower extremity edema  Imaging: No results found.  Labs: BMET Recent Labs  Lab 01/02/21 0549 01/03/21 0653 01/04/21 0607 01/05/21 0550 01/06/21 0629 01/07/21 0510 01/08/21 0552  NA 137 137 139 139 142 140 142  K 3.5 3.6 3.8 3.6 3.6 3.9 4.0  CL 103 103 103 103 103 103 102  CO2 23 24 24 23 26 25 27   GLUCOSE 188* 170* 190* 159* 76 164* 192*  BUN 86* 87* 89* 87* 88* 92* 92*  CREATININE 4.27* 4.41* 4.64* 4.88* 4.87* 4.77* 4.69*  CALCIUM 6.5* 6.4* 6.6* 6.3* 6.7* 6.5* 6.6*  PHOS 4.6 4.4 4.4 4.7* 4.9* 5.2* 4.8*   CBC Recent Labs  Lab 01/06/21 0629 01/07/21 1008 01/08/21 0552  WBC 8.4 9.5 9.6  HGB 7.1* 6.5* 9.5*  HCT 23.6* 21.7* 30.3*  MCV 101.7* 99.1 97.7  PLT 136* 139* 145*    Medications:    . sodium chloride   Intravenous Once  . aspirin EC  81 mg Oral Daily  . calcium carbonate  800 mg of elemental calcium Oral TID WC  . Chlorhexidine Gluconate Cloth  6 each Topical Daily  . Chlorhexidine Gluconate Cloth  6 each Topical Q0600  . cholecalciferol  2,000 Units Oral Daily  . darbepoetin (ARANESP) injection - NON-DIALYSIS  100 mcg Subcutaneous Q Tue-1800  . diltiazem  120 mg Oral Daily  . insulin aspart  0-5 Units Subcutaneous QHS  . insulin aspart  0-9 Units Subcutaneous TID WC  .  insulin aspart  3 Units Subcutaneous TID WC  . lipase/protease/amylase  12,000 Units Oral TID AC  . mouth rinse  15 mL Mouth Rinse BID  . metoprolol tartrate  50 mg Oral BID  . saccharomyces boulardii  250 mg Oral BID  . sodium bicarbonate  1,300 mg Oral BID   Elmarie Shiley, MD 01/08/2021, 8:27 AM

## 2021-01-08 NOTE — Procedures (Signed)
   HEMODIALYSIS TREATMENT NOTE:  Activase instillation x 1.5 hours was required to restore catheter flow.  3 hour session completed without ultrafiltration, as per order.  Pt in NSR with frequent episodes of Afib with non-sustained RVR as high as 144.  His BP was stable and he was asymptomatic.  Daily diltiazem was given this morning.  This has happened every HD session, even without ultrafiltration.  Metoprolol 50 mg p.o. is due upon return to the unit.  Discussed with primary nurse Theressa Stamps.  [place], RN

## 2021-01-08 NOTE — TOC Progression Note (Signed)
Transition of Care (TOC) - Progression Note    Patient Details  Name: DECORIAN SCHUENEMANN Sr. MRN: [id number] Date of Birth: 06-23-1941  Transition of Care [facility]) CM/SW Contact  Natasha Bence, LCSW Phone Number: 01/08/2021, 3:20 PM  Clinical Narrative:    Patient's wife reported that patient is fully vaccinated for Covid 19 with booster. Patient's wife reported that she is not agreeable to [place] and would like further inquiry to [facility] and another facility that she was unsure of the name. CSW contacted Ebony Hail with [facility] to inquire about pending referral status. CSW waiting for reply. Patient's wife reported that she will identify the other facility she would like a referral to be placed with and notify CSW. TOC to follow.   Expected Discharge Plan: Skilled Nursing Facility Barriers to Discharge: Continued Medical Work up  Expected Discharge Plan and Services Expected Discharge Plan: [place] Choice: [place] arrangements for the past 2 months: Single Family Home                                       Social Determinants of Health (SDOH) Interventions    Readmission Risk Interventions Readmission Risk Prevention Plan 12/04/2018  Transportation Screening Complete  PCP or Specialist Appt within 3-5 Days (No Data)  [place] or [place] Complete  Social Work Consult for [place] Planning/Counseling Complete  Palliative Care Screening Not Applicable  Medication Review Press photographer) Complete  Some recent data might be hidden

## 2021-01-08 NOTE — Progress Notes (Signed)
PROGRESS NOTE  Natalie Leclaire JJO:[id number] DOB: [birth year] DOA: 12/23/2020 PCP: [name], MD  Brief History: [name] Sr.is a 80 y.o.malewith medical history significant fortype 2 diabetes, CKD stage IIIb, hypertension, dyslipidemia, diastolic CHF, and CAD described to the ED via EMS with increasing weakness and confusion along with poor appetite for the last 2 to 3 days.Patient was admitted with AKI and severe acidosis along with hyperglycemia. He continues to have persistent AKI with uremic encephalopathy. He is also noted to be COVID-positive and has been started on remdesivir.Patient remained stable on RA. He received 3 days remdesivir. Unfortunately, he remained confused and his renal function continued to deteriorate. Nephrology was consulted. A temporary HD catheter was place on 12/29/20 and patient was initiated on HD on 12/29/20 and 5/28. Now, the patient is non-oliguric with close observation for renal recovery. With HD, his mental status improved.  Assessment/Plan: Generalized weakness with acute metabolic encephalopathy -secondary to AKI with severe metabolic acidosis and uncompensated respiratory alkalosis -secondary to uremic encephalopathy. -acidosis due to AKI; -mentationimproving andstable; patient cooperative with examination and oriented x3..  Non-oliguricAKIon CKD3b -baseline creatinine 1.7-2.0 -due toischemic ATN in the setting of prerenal with dehydration, volume depletion, hypotension and acute COVID infection. -Renal ultrasoundwithout hydronephrosis -avoiding nephrotoxic agents, hypotension and the use of contrast. -General surgery placed left internal jugularHDcatheter 12/29/2020; -First dialysis provided on 12/29/2020, overall well-tolerated. Second dialysis treatment much better tolerated on 12/30/2020 -HD#3 on 01/08/21 due to concerns of uremic symptoms.  -continuemonitoring for renal recovery -ContinueIVF  per nephrology  Hypocalcemia -Continue to follow electrolytes trend -Continue telemetry monitoring. -Continue Tums as per nephrology recommendation. -added vitamin D -01/05/21--calcium gluconate added  Anemia of CKD -Hgb slowly trended down -check FOBT -iron saturation 18; ferritin 160 -F09 323 -folic 55.7 -6/5 transfuse 2 units PRBC  Hyperglycemiain setting of DM2with mild DKA -resolved -Compliant with home medications according to wife at bedside -Hemoglobin A1c7.0% demonstrating good control. -Continue sliding scale insulin and follow CBGs fluctuation -added novolog 3 units with meals  COVID-19 infection -Chest x-ray with stable cardiomegaly and no other signs of infiltrate -Patient received 3 days of remdesivir for asymptomatic COVID-19 infection. -stable on RA -Continue isolation precautionsand continue practicing diet 3 W's (Wait 6 feet apart, Wash hands frequently and Wear a mask)at time of discharge. -can removed respiratory isolation  Thrombocytopenia -Overallstable; likely due to acute medical illness -Continue to monitor platelet count trend. -Continue avoiding heparin agents -B12--634 -folate--24.5 -TSH--1.477  Hypertension/CAD -continue metoprolol -no chest pain -12/31/20 Echo--EF 60-65%, no WMA, G1DD  Very Hard of hearing -Patient uses hearing aids at baseline.  SVT -No sustained and associated with hemodialysis initiation -pt continues to have short episodes of SVT into 120s -5/29/222D echoEF 60-65%, no WMA, G1DDNo significant valvular disorder appreciated. -continue metoprolol -continue diltiazem  Physical deconditioning -Physical therapy has seen patient and recommended to skilled nursing facility at discharge for rehabilitation.     Status is: Inpatient  Remains inpatient appropriate because:IV treatments appropriate due to intensity of illness or inability to take PO   Dispo: The patient is  from:Home Anticipated d/c is to:SNF Patient currently is not medically stable to d/c. Difficult to place patient No        Family Communication:spouse updated 6/6  Consultants:renal  Code Status: FULL   DVT Prophylaxis: SCDs   Procedures: As Listed in Progress Note Above  Antibiotics: None     Subjective: Patient denies fevers, chills, headache, chest  pain, dyspnea, nausea, vomiting, diarrhea, abdominal pain, dysuria, hematuria, hematochezia, and melena.   Objective: Vitals:   01/08/21 0334 01/08/21 0500 01/08/21 1310 01/08/21 1630  BP: 135/70  121/73 130/78  Pulse: 76  68 74  Resp: 20  18 (!) 22  Temp: 98.2 F (36.8 C)  98 F (36.7 C) 98.1 F (36.7 C)  TempSrc: Oral  Oral Oral  SpO2: 96%  96% 97%  Weight:  92.5 kg  92.6 kg    Intake/Output Summary (Last 24 hours) at 01/08/2021 1703 Last data filed at 01/08/2021 1610 Gross per 24 hour  Intake 2532 ml  Output 2200 ml  Net 332 ml   Weight change: -2.1 kg Exam:   General:  Pt is alert, follows commands appropriately, not in acute distress  HEENT: No icterus, No thrush, No neck mass, [place]/AT  Cardiovascular: RRR, S1/S2, no rubs, no gallops  Respiratory: bibasilar crackles. No wheeze  Abdomen: Soft/+BS, non tender, non distended, no guarding  Extremities: No edema, No lymphangitis, No petechiae, No rashes, no synovitis   Data Reviewed: I have personally reviewed following labs and imaging studies Basic Metabolic Panel: Recent Labs  Lab 01/04/21 0607 01/05/21 0550 01/06/21 0629 01/07/21 0510 01/08/21 0552  NA 139 139 142 140 142  K 3.8 3.6 3.6 3.9 4.0  CL 103 103 103 103 102  CO2 24 23 26 25 27   GLUCOSE 190* 159* 76 164* 192*  BUN 89* 87* 88* 92* 92*  CREATININE 4.64* 4.88* 4.87* 4.77* 4.69*  CALCIUM 6.6* 6.3* 6.7* 6.5* 6.6*  PHOS 4.4 4.7* 4.9* 5.2* 4.8*   Liver Function Tests: Recent Labs  Lab 01/04/21 0607 01/05/21 0550  01/06/21 0629 01/07/21 0510 01/08/21 0552  ALBUMIN 1.7* 1.7* 2.8* 2.3* 2.2*   No results for input(s): LIPASE, AMYLASE in the last 168 hours. No results for input(s): AMMONIA in the last 168 hours. Coagulation Profile: No results for input(s): INR, PROTIME in the last 168 hours. CBC: Recent Labs  Lab 01/06/21 0629 01/07/21 1008 01/08/21 0552  WBC 8.4 9.5 9.6  HGB 7.1* 6.5* 9.5*  HCT 23.6* 21.7* 30.3*  MCV 101.7* 99.1 97.7  PLT 136* 139* 145*   Cardiac Enzymes: No results for input(s): CKTOTAL, CKMB, CKMBINDEX, TROPONINI in the last 168 hours. BNP: Invalid input(s): POCBNP CBG: Recent Labs  Lab 01/07/21 1143 01/07/21 1637 01/07/21 2159 01/08/21 0728 01/08/21 1132  GLUCAP 296* 210* 152* 182* 185*   HbA1C: No results for input(s): HGBA1C in the last 72 hours. Urine analysis:    Component Value Date/Time   COLORURINE AMBER (A) 01/05/2021 1722   APPEARANCEUR TURBID (A) 01/05/2021 1722   LABSPEC 1.007 01/05/2021 1722   PHURINE 5.0 01/05/2021 1722   GLUCOSEU >=500 (A) 01/05/2021 1722   HGBUR MODERATE (A) 01/05/2021 1722   BILIRUBINUR NEGATIVE 01/05/2021 1722   KETONESUR NEGATIVE 01/05/2021 1722   PROTEINUR 100 (A) 01/05/2021 1722   UROBILINOGEN 0.2 03/20/2013 2016   NITRITE NEGATIVE 01/05/2021 1722   LEUKOCYTESUR LARGE (A) 01/05/2021 1722   Sepsis Labs: @LABRCNTIP (procalcitonin:4,lacticidven:4) ) Recent Results (from the past 240 hour(s))  Culture, Urine     Status: Abnormal   Collection Time: 01/05/21  5:22 PM   Specimen: Urine, Clean Catch  Result Value Ref Range Status   Specimen Description   Final    URINE, CLEAN CATCH Performed at [facility], [address] [place],  [address]    Special Requests   Final    NONE Performed at [facility], [address] [place],  [place] [address]    Culture >=100,000 COLONIES/mL ESCHERICHIA COLI (A)  Final   Report Status 01/08/2021 FINAL  Final   Organism ID, Bacteria ESCHERICHIA COLI (A)  Final       Susceptibility   Escherichia coli - MIC*    AMPICILLIN >=32 RESISTANT Resistant     CEFAZOLIN <=4 SENSITIVE Sensitive     CEFEPIME <=0.12 SENSITIVE Sensitive     CEFTRIAXONE <=0.25 SENSITIVE Sensitive     CIPROFLOXACIN <=0.25 SENSITIVE Sensitive     GENTAMICIN >=16 RESISTANT Resistant     IMIPENEM <=0.25 SENSITIVE Sensitive     NITROFURANTOIN <=16 SENSITIVE Sensitive     TRIMETH/SULFA >=320 RESISTANT Resistant     AMPICILLIN/SULBACTAM >=32 RESISTANT Resistant     PIP/TAZO <=4 SENSITIVE Sensitive     * >=100,000 COLONIES/mL ESCHERICHIA COLI     Scheduled Meds: . sodium chloride   Intravenous Once  . alteplase  2 mg Intracatheter Once  . alteplase  2 mg Intracatheter Once  . aspirin EC  81 mg Oral Daily  . calcium carbonate  800 mg of elemental calcium Oral TID WC  . Chlorhexidine Gluconate Cloth  6 each Topical Daily  . Chlorhexidine Gluconate Cloth  6 each Topical Q0600  . cholecalciferol  2,000 Units Oral Daily  . darbepoetin (ARANESP) injection - NON-DIALYSIS  100 mcg Subcutaneous Q Tue-1800  . diltiazem  120 mg Oral Daily  . insulin aspart  0-5 Units Subcutaneous QHS  . insulin aspart  0-9 Units Subcutaneous TID WC  . insulin aspart  3 Units Subcutaneous TID WC  . lipase/protease/amylase  12,000 Units Oral TID AC  . mouth rinse  15 mL Mouth Rinse BID  . metoprolol tartrate  50 mg Oral BID  . saccharomyces boulardii  250 mg Oral BID  . sodium bicarbonate  1,300 mg Oral BID   Continuous Infusions: . sodium chloride    . sodium chloride      Procedures/Studies: CT Head Wo Contrast  Result Date: 12/23/2020 CLINICAL DATA:  Mental status changes EXAM: CT HEAD WITHOUT CONTRAST TECHNIQUE: Contiguous axial images were obtained from the base of the skull through the vertex without intravenous contrast. COMPARISON:  12/04/2018 FINDINGS: Brain: No acute intracranial abnormality. Specifically, no hemorrhage, hydrocephalus, mass lesion, acute infarction, or significant intracranial  injury. There is atrophy and chronic small vessel disease changes. Vascular: No hyperdense vessel or unexpected calcification. Skull: No acute calvarial abnormality. Sinuses/Orbits: No acute findings Other: None IMPRESSION: Atrophy, chronic microvascular disease. No acute intracranial abnormality. Electronically Signed   By: Rolm Baptise M.D.   On: 12/23/2020 12:54   US RENAL  Result Date: 12/24/2020 CLINICAL DATA:  80 year old male with acute renal insufficiency. EXAM: RENAL / URINARY TRACT ULTRASOUND COMPLETE COMPARISON:  Abdomen ultrasound 12/01/2017. CT Abdomen and Pelvis 03/20/2013. FINDINGS: Right Kidney: Renal measurements: 11.8 x 5.3 x 7.5 cm = volume: 247 mL. Cortical echogenicity within normal limits. No hydronephrosis. Extrarenal pelvis is stable (normal variant). No right renal mass. Left Kidney: Renal measurements: 12.9 x 5.6 x 5.9 cm = volume: 221 mL. Cortical echogenicity within normal limits. Lower pole curvilinear hypoechogenicity on image 29 probably corresponds to chronic perinephric stranding seen in 2014. No left hydronephrosis today. Possible punctate chronic lower pole calculus on image 38, stable since 2014. Bladder: Decompressed and poorly visible, reportedly a Foley catheter is in place. Other: Echogenic liver on image 2. IMPRESSION: 1. No hydronephrosis or acute renal finding. Chronic left lower pole nephrolithiasis. 2. Evidence of fatty liver disease. Electronically Signed  By: Genevie Ann M.D.   On: 12/24/2020 11:18   DG Chest Port 1 View  Result Date: 12/29/2020 CLINICAL DATA:  Status post dialysis catheter placement. EXAM: PORTABLE CHEST 1 VIEW COMPARISON:  Single-view of the chest 12/23/2020. FINDINGS: Left IJ approach dialysis catheter is in place with the tip projecting in the lower superior vena cava. No pneumothorax. Elevation of the right hemidiaphragm is unchanged. Lungs are clear. Heart size is upper normal. No acute or focal bony abnormality. IMPRESSION: Dialysis catheter  tip projects in the lower superior vena cava. Negative for pneumothorax or acute disease. Electronically Signed   By: Inge Rise M.D.   On: 12/29/2020 11:42   DG Chest Portable 1 View  Result Date: 12/23/2020 CLINICAL DATA:  Weakness. COVID 2 weeks ago. EXAM: PORTABLE CHEST 1 VIEW COMPARISON:  Two-view chest x-ray 12/03/2018 FINDINGS: Heart is chronically enlarged. Chronic elevation of the right hemidiaphragm noted. No edema or effusion is present. No focal airspace disease is present. Axial skeleton is unremarkable. IMPRESSION: No acute cardiopulmonary disease or significant interval change. Stable cardiomegaly without failure. Electronically Signed   By: San Morelle M.D.   On: 12/23/2020 12:33   ECHOCARDIOGRAM COMPLETE  Result Date: 12/31/2020    ECHOCARDIOGRAM REPORT   Patient Name:   JEREMIYAH CULLENS Sr. Date of Exam: 12/31/2020 Medical Rec #:  [id number]            Height:       68.5 in Accession #:    [id number]           Weight:       208.6 lb Date of Birth:  1941-03-29            BSA:          2.092 m Patient Age:    26 years             BP:           146/63 mmHg Patient Gender: M                    HR:           84 bpm. Exam Location:  [facility] Procedure: 2D Echo, Cardiac Doppler and Color Doppler Indications:    Atrial Fibrillation I48.91  History:        Patient has prior history of Echocardiogram examinations, most                 recent 12/12/2011. Risk Factors:Hypertension, Diabetes and                 Dyslipidemia.  Sonographer:    Alvino Chapel RCS Referring Phys: [place]  1. Left ventricular ejection fraction, by estimation, is 60 to 65%. The left ventricle has normal function. The left ventricle has no regional wall motion abnormalities. There is mild left ventricular hypertrophy. Left ventricular diastolic parameters are consistent with Grade I diastolic dysfunction (impaired relaxation).  2. Right ventricular systolic function is normal. The right  ventricular size is normal. There is normal pulmonary artery systolic pressure.  3. The mitral valve is normal in structure. No evidence of mitral valve regurgitation. No evidence of mitral stenosis.  4. The aortic valve is tricuspid. Aortic valve regurgitation is not visualized. Mild aortic valve sclerosis is present, with no evidence of aortic valve stenosis.  5. Aortic dilatation noted. There is mild dilatation of the aortic root, measuring 39 mm.  6. The inferior vena cava is normal  in size with greater than 50% respiratory variability, suggesting right atrial pressure of 3 mmHg. FINDINGS  Left Ventricle: Left ventricular ejection fraction, by estimation, is 60 to 65%. The left ventricle has normal function. The left ventricle has no regional wall motion abnormalities. The left ventricular internal cavity size was normal in size. There is  mild left ventricular hypertrophy. Left ventricular diastolic parameters are consistent with Grade I diastolic dysfunction (impaired relaxation). Right Ventricle: The right ventricular size is normal. Right ventricular systolic function is normal. There is normal pulmonary artery systolic pressure. The tricuspid regurgitant velocity is 2.82 m/s, and with an assumed right atrial pressure of 3 mmHg,  the estimated right ventricular systolic pressure is 56.3 mmHg. Left Atrium: Left atrial size was normal in size. Right Atrium: Right atrial size was normal in size. Pericardium: There is no evidence of pericardial effusion. Mitral Valve: The mitral valve is normal in structure. Mild mitral annular calcification. No evidence of mitral valve regurgitation. No evidence of mitral valve stenosis. Tricuspid Valve: The tricuspid valve is normal in structure. Tricuspid valve regurgitation is mild . No evidence of tricuspid stenosis. Aortic Valve: The aortic valve is tricuspid. Aortic valve regurgitation is not visualized. Mild aortic valve sclerosis is present, with no evidence of aortic  valve stenosis. Pulmonic Valve: The pulmonic valve was normal in structure. Pulmonic valve regurgitation is not visualized. No evidence of pulmonic stenosis. Aorta: Aortic dilatation noted. There is mild dilatation of the aortic root, measuring 39 mm. Venous: The inferior vena cava is normal in size with greater than 50% respiratory variability, suggesting right atrial pressure of 3 mmHg.  LEFT VENTRICLE PLAX 2D LVIDd:         4.80 cm  Diastology LVIDs:         3.30 cm  LV e' medial:    5.66 cm/s LV PW:         1.10 cm  LV E/e' medial:  17.0 LV IVS:        1.50 cm  LV e' lateral:   8.81 cm/s LVOT diam:     2.10 cm  LV E/e' lateral: 10.9 LV SV:         85 LV SV Index:   40 LVOT Area:     3.46 cm  RIGHT VENTRICLE RV S prime:     12.00 cm/s TAPSE (M-mode): 2.1 cm LEFT ATRIUM             Index       RIGHT ATRIUM           Index LA diam:        3.00 cm 1.43 cm/m  RA Area:     16.10 cm LA Vol (A2C):   50.1 ml 23.95 ml/m RA Volume:   43.20 ml  20.65 ml/m LA Vol (A4C):   60.2 ml 28.78 ml/m LA Biplane Vol: 56.1 ml 26.82 ml/m  AORTIC VALVE LVOT Vmax:   110.00 cm/s LVOT Vmean:  79.300 cm/s LVOT VTI:    0.244 m  AORTA Ao Root diam: 3.90 cm MITRAL VALVE                TRICUSPID VALVE MV Area (PHT): 4.08 cm     TR Peak grad:   31.8 mmHg MV Decel Time: 186 msec     TR Vmax:        282.00 cm/s MV E velocity: 96.00 cm/s MV A velocity: 117.00 cm/s  SHUNTS MV E/A ratio:  0.82  Systemic VTI:  0.24 m                             Systemic Diam: 2.10 cm Kirk Ruths MD Electronically signed by Kirk Ruths MD Signature Date/Time: 12/31/2020/10:23:47 AM    Final     Orson Eva, DO  [facility]  If 7PM-7AM, please contact night-coverage [email or web address] Password [email or web address] 01/08/2021, 5:03 PM   LOS: 16 days

## 2021-01-09 DIAGNOSIS — L8990 Pressure ulcer of unspecified site, unspecified stage: Secondary | ICD-10-CM | POA: Insufficient documentation

## 2021-01-09 LAB — GLUCOSE, CAPILLARY
Glucose-Capillary: 137 mg/dL — ABNORMAL HIGH (ref 70–99)
Glucose-Capillary: 169 mg/dL — ABNORMAL HIGH (ref 70–99)
Glucose-Capillary: 203 mg/dL — ABNORMAL HIGH (ref 70–99)
Glucose-Capillary: 186 mg/dL — ABNORMAL HIGH (ref 70–99)

## 2021-01-09 LAB — BPAM RBC
Blood Product Expiration Date: 202207122359
Blood Product Expiration Date: 202207122359
ISSUE DATE / TIME: 202206051934
ISSUE DATE / TIME: 202206060144
Unit Type and Rh: 5100
Unit Type and Rh: 5100

## 2021-01-09 LAB — TYPE AND SCREEN
ABO/RH(D): O POS
Antibody Screen: NEGATIVE
Unit division: 0
Unit division: 0

## 2021-01-09 LAB — RENAL FUNCTION PANEL
Albumin: 2.1 g/dL — ABNORMAL LOW (ref 3.5–5.0)
Anion gap: 9 (ref 5–15)
BUN: 63 mg/dL — ABNORMAL HIGH (ref 8–23)
CO2: 28 mmol/L (ref 22–32)
Calcium: 6.6 mg/dL — ABNORMAL LOW (ref 8.9–10.3)
Chloride: 102 mmol/L (ref 98–111)
Creatinine, Ser: 3.56 mg/dL — ABNORMAL HIGH (ref 0.61–1.24)
GFR, Estimated: 17 mL/min — ABNORMAL LOW (ref >60)
Glucose, Bld: 178 mg/dL — ABNORMAL HIGH (ref 70–99)
Phosphorus: 3.5 mg/dL (ref 2.5–4.6)
Potassium: 3.6 mmol/L (ref 3.5–5.1)
Sodium: 139 mmol/L (ref 135–145)

## 2021-01-09 LAB — CBC
HCT: 30.5 % — ABNORMAL LOW (ref 39.0–52.0)
Hemoglobin: 9.6 g/dL — ABNORMAL LOW (ref 13.0–17.0)
MCH: 30.5 pg (ref 26.0–34.0)
MCHC: 31.5 g/dL (ref 30.0–36.0)
MCV: 96.8 fL (ref 80.0–100.0)
Platelets: 159 10*3/uL (ref 150–400)
RBC: 3.15 MIL/uL — ABNORMAL LOW (ref 4.22–5.81)
RDW: 14.2 % (ref 11.5–15.5)
WBC: 7.4 10*3/uL (ref 4.0–10.5)
nRBC: 0 % (ref 0.0–0.2)

## 2021-01-09 LAB — FERRITIN: Ferritin: 251 ng/mL (ref 24–336)

## 2021-01-09 LAB — C-REACTIVE PROTEIN: CRP: 14.5 mg/dL — ABNORMAL HIGH (ref <1.0)

## 2021-01-09 MED ORDER — LIDOCAINE 5 % EX PTCH
1.0000 | MEDICATED_PATCH | Freq: Every day | CUTANEOUS | Status: DC
  Administered 2021-01-09 – 2021-01-25 (×15): 1 via TRANSDERMAL
  Filled 2021-01-09 (×17): qty 1

## 2021-01-09 NOTE — Progress Notes (Signed)
Pt ambulated to the chair via walker, 2 person assist. Pt gait is unsteady. Pt only tolerated sitting up in the chair for a hour and a half before complaining he was hurting and wanted to return to bed. Nurse and tech assisted pt to return to the bed. Wife is at bedside.

## 2021-01-09 NOTE — Progress Notes (Addendum)
PROGRESS NOTE  [name] NOB:[id number] DOB: 08/31/1940 DOA: 12/23/2020 PCP: [name], MD  Brief History: Mark Dicksonis a 80 y.o.malewith medical history significant fortype 2 diabetes, CKD stage IIIb, hypertension, dyslipidemia, diastolic CHF, and CAD described to the ED via EMS with increasing weakness and confusion along with poor appetite for the last 2 to 3 days.Patient was admitted with AKI and severe acidosis along with hyperglycemia. He continues to have persistent AKI with uremic encephalopathy. He is also noted to be COVID-positive and has been started on remdesivir.Patient remained stable on RA. He received 3 days remdesivir. Unfortunately, he remained confused and his renal function continued to deteriorate. Nephrology was consulted. A temporary HD catheter was place on 12/29/20 and patient was initiated on HD on 12/29/20 and 5/28. Now, the patient is non-oliguric with close observation for renal recovery. With HD, his mental status improved.  Assessment/Plan: Generalized weakness with acute metabolic encephalopathy -secondary to AKI with severe metabolic acidosis and uncompensated respiratory alkalosis -secondary to uremic encephalopathy. -overall continues to improve but not at baseline -suspect patient had underlying cognitive impairment--this was confirmed with daughter and spouse both of whom stated he has had mental/cognitive decline over past 6-12 months prior to admission -metabolic work up including B12, folate, TSH unremarkable -check ammonia  Non-oliguricAKIon CKD3b -baseline creatinine 1.7-2.0 -due toischemic ATN in the setting of prerenal with dehydration, volume depletion, hypotension and acute COVID infection. -Renal ultrasoundwithout hydronephrosis -avoiding nephrotoxic agents, hypotension and the use of contrast. -General surgery placed left internal jugularHDcatheter 12/29/2020; -First dialysis provided  on 12/29/2020, overall well-tolerated. Second dialysis treatment much better tolerated on 12/30/2020 -HD#3 on 01/08/21 due to concerns of uremic symptoms--daughter confirms that mental status gradually improving during hospitalization -continuemonitoring for renal recovery -ContinueIVF per nephrology  Hypocalcemia -Continue to follow electrolytes trend -Continue telemetry monitoring. -Continue Tums as per nephrology recommendation. -addedvitamin D -01/05/21--calcium gluconate added  Anemia of CKD -Hgbslowly trendeddown -check FOBT -iron saturation 18; ferritin 947 -M54 650 -folic 35.4 -6/5 transfuse 2 units PRBC  Hyperglycemiain setting of DM2with mild DKA -resolved -Compliant with home medications according to wife at bedside -Hemoglobin A1c7.0% demonstrating good control. -Continue sliding scale insulin and follow CBGs fluctuation -addednovolog 3 units with meals  COVID-19 infection -Chest x-ray with stable cardiomegaly and no other signs of infiltrate -Patient received 3 days of remdesivir for asymptomatic COVID-19 infection. -stable on RA -Continue isolation precautionsand continue practicing diet 3 W's (Wait 6 feet apart, Wash hands frequently and Wear a mask)at time of discharge. -now cleared respiratory isolation  Thrombocytopenia -Overallstable; likely due to acute medical illness -Continue to monitor platelet count--improving -Continue avoiding heparin agents -B12--634 -folate--24.5 -TSH--1.477  Hypertension/CAD -continue metoprolol -no chest pain -12/31/20 Echo--EF 60-65%, no WMA, G1DD  Very Hard of hearing -Patient uses hearing aids at baseline.  SVT -Non sustained and associated with hemodialysis  -pt continues to have short episodes of SVT into 120s off HD but infrequent and lasts <10 seconds -5/29/222D echoEF 60-65%, no WMA, G1DDNo significant valvular disorder appreciated. -continue metoprolol -continue diltiazem  Physical  deconditioning -Physical therapy has seen patient and recommended to skilled nursing facility at discharge for rehabilitation.     Status is: Inpatient  Remains inpatient appropriate because:IV treatments appropriate due to intensity of illness or inability to take PO   Dispo: The patient is from:Home Anticipated d/c is to:SNF Patient currently is not medically stable to d/c. Difficult to place patient No  Family Communication:daughter updated 6/7  Consultants:renal  Code Status: FULL   DVT Prophylaxis: SCDs   Procedures: As Listed in Progress Note Above  Antibiotics: None     Subjective: Patient denies fevers, chills, headache, chest pain, dyspnea, nausea, vomiting, diarrhea, abdominal pain, dysuria, hematuria, hematochezia, and melena.   Objective: Vitals:   01/09/21 0225 01/09/21 0442 01/09/21 0836 01/09/21 1305  BP:  136/76 132/85 133/69  Pulse:  81 85 89  Resp:  20 20 20   Temp:  98.2 F (36.8 C)  98.2 F (36.8 C)  TempSrc:    Oral  SpO2:  96% 96% 97%  Weight: 92.7 kg       Intake/Output Summary (Last 24 hours) at 01/09/2021 1706 Last data filed at 01/09/2021 1300 Gross per 24 hour  Intake 720 ml  Output 498 ml  Net 222 ml   Weight change: 0.1 kg Exam:   General:  Pt is alert, follows commands appropriately, not in acute distress  HEENT: No icterus, No thrush, No neck mass, [place]/AT  Cardiovascular: RRR, S1/S2, no rubs, no gallops  Respiratory: bibasilar rales. No wheeze  Abdomen: Soft/+BS, non tender, non distended, no guarding  Extremities: trace LE edema, No lymphangitis, No petechiae, No rashes, no synovitis   Data Reviewed: I have personally reviewed following labs and imaging studies Basic Metabolic Panel: Recent Labs  Lab 01/05/21 0550 01/06/21 0629 01/07/21 0510 01/08/21 0552 01/09/21 0706  NA 139 142 140 142 139  K 3.6 3.6 3.9 4.0 3.6  CL 103  103 103 102 102  CO2 23 26 25 27 28   GLUCOSE 159* 76 164* 192* 178*  BUN 87* 88* 92* 92* 63*  CREATININE 4.88* 4.87* 4.77* 4.69* 3.56*  CALCIUM 6.3* 6.7* 6.5* 6.6* 6.6*  PHOS 4.7* 4.9* 5.2* 4.8* 3.5   Liver Function Tests: Recent Labs  Lab 01/05/21 0550 01/06/21 0629 01/07/21 0510 01/08/21 0552 01/09/21 0706  ALBUMIN 1.7* 2.8* 2.3* 2.2* 2.1*   No results for input(s): LIPASE, AMYLASE in the last 168 hours. No results for input(s): AMMONIA in the last 168 hours. Coagulation Profile: No results for input(s): INR, PROTIME in the last 168 hours. CBC: Recent Labs  Lab 01/06/21 0629 01/07/21 1008 01/08/21 0552 01/09/21 0706  WBC 8.4 9.5 9.6 7.4  HGB 7.1* 6.5* 9.5* 9.6*  HCT 23.6* 21.7* 30.3* 30.5*  MCV 101.7* 99.1 97.7 96.8  PLT 136* 139* 145* 159   Cardiac Enzymes: No results for input(s): CKTOTAL, CKMB, CKMBINDEX, TROPONINI in the last 168 hours. BNP: Invalid input(s): POCBNP CBG: Recent Labs  Lab 01/08/21 0728 01/08/21 1132 01/08/21 2134 01/09/21 0745 01/09/21 1110  GLUCAP 182* 185* 137* 169* 203*   HbA1C: No results for input(s): HGBA1C in the last 72 hours. Urine analysis:    Component Value Date/Time   COLORURINE AMBER (A) 01/05/2021 1722   APPEARANCEUR TURBID (A) 01/05/2021 1722   LABSPEC 1.007 01/05/2021 1722   PHURINE 5.0 01/05/2021 1722   GLUCOSEU >=500 (A) 01/05/2021 1722   HGBUR MODERATE (A) 01/05/2021 1722   BILIRUBINUR NEGATIVE 01/05/2021 1722   KETONESUR NEGATIVE 01/05/2021 1722   PROTEINUR 100 (A) 01/05/2021 1722   UROBILINOGEN 0.2 03/20/2013 2016   NITRITE NEGATIVE 01/05/2021 1722   LEUKOCYTESUR LARGE (A) 01/05/2021 1722   Sepsis Labs: @LABRCNTIP (procalcitonin:4,lacticidven:4) ) Recent Results (from the past 240 hour(s))  Culture, Urine     Status: Abnormal   Collection Time: 01/05/21  5:22 PM   Specimen: Urine, Clean Catch  Result Value Ref Range Status   Specimen Description  Final    URINE, CLEAN CATCH Performed at [facility], [address], [place], [place] [address]    Special Requests   Final    NONE Performed at [facility], [address] [place], [place] [address]    Culture >=100,000 COLONIES/mL ESCHERICHIA COLI (A)  Final   Report Status 01/08/2021 FINAL  Final   Organism ID, Bacteria ESCHERICHIA COLI (A)  Final      Susceptibility   Escherichia coli - MIC*    AMPICILLIN >=32 RESISTANT Resistant     CEFAZOLIN <=4 SENSITIVE Sensitive     CEFEPIME <=0.12 SENSITIVE Sensitive     CEFTRIAXONE <=0.25 SENSITIVE Sensitive     CIPROFLOXACIN <=0.25 SENSITIVE Sensitive     GENTAMICIN >=16 RESISTANT Resistant     IMIPENEM <=0.25 SENSITIVE Sensitive     NITROFURANTOIN <=16 SENSITIVE Sensitive     TRIMETH/SULFA >=320 RESISTANT Resistant     AMPICILLIN/SULBACTAM >=32 RESISTANT Resistant     PIP/TAZO <=4 SENSITIVE Sensitive     * >=100,000 COLONIES/mL ESCHERICHIA COLI     Scheduled Meds: . sodium chloride   Intravenous Once  . alteplase  2 mg Intracatheter Once  . alteplase  2 mg Intracatheter Once  . aspirin EC  81 mg Oral Daily  . calcium carbonate  800 mg of elemental calcium Oral TID WC  . Chlorhexidine Gluconate Cloth  6 each Topical Daily  . Chlorhexidine Gluconate Cloth  6 each Topical Q0600  . cholecalciferol  2,000 Units Oral Daily  . darbepoetin (ARANESP) injection - NON-DIALYSIS  100 mcg Subcutaneous Q Tue-1800  . diltiazem  120 mg Oral Daily  . insulin aspart  0-5 Units Subcutaneous QHS  . insulin aspart  0-9 Units Subcutaneous TID WC  . insulin aspart  3 Units Subcutaneous TID WC  . lidocaine  1 patch Transdermal Daily  . lipase/protease/amylase  12,000 Units Oral TID AC  . mouth rinse  15 mL Mouth Rinse BID  . metoprolol tartrate  50 mg Oral BID  . saccharomyces boulardii  250 mg Oral BID  . sodium bicarbonate  1,300 mg Oral BID   Continuous Infusions: . sodium chloride    . sodium chloride      Procedures/Studies: CT Head Wo Contrast  Result Date: 12/23/2020 CLINICAL DATA:   Mental status changes EXAM: CT HEAD WITHOUT CONTRAST TECHNIQUE: Contiguous axial images were obtained from the base of the skull through the vertex without intravenous contrast. COMPARISON:  12/04/2018 FINDINGS: Brain: No acute intracranial abnormality. Specifically, no hemorrhage, hydrocephalus, mass lesion, acute infarction, or significant intracranial injury. There is atrophy and chronic small vessel disease changes. Vascular: No hyperdense vessel or unexpected calcification. Skull: No acute calvarial abnormality. Sinuses/Orbits: No acute findings Other: None IMPRESSION: Atrophy, chronic microvascular disease. No acute intracranial abnormality. Electronically Signed   By: Rolm Baptise M.D.   On: 12/23/2020 12:54   US RENAL  Result Date: 12/24/2020 CLINICAL DATA:  80 year old male with acute renal insufficiency. EXAM: RENAL / URINARY TRACT ULTRASOUND COMPLETE COMPARISON:  Abdomen ultrasound 12/01/2017. CT Abdomen and Pelvis 03/20/2013. FINDINGS: Right Kidney: Renal measurements: 11.8 x 5.3 x 7.5 cm = volume: 247 mL. Cortical echogenicity within normal limits. No hydronephrosis. Extrarenal pelvis is stable (normal variant). No right renal mass. Left Kidney: Renal measurements: 12.9 x 5.6 x 5.9 cm = volume: 221 mL. Cortical echogenicity within normal limits. Lower pole curvilinear hypoechogenicity on image 29 probably corresponds to chronic perinephric stranding seen in 2014. No left hydronephrosis today. Possible punctate chronic lower pole  calculus on image 38, stable since 2014. Bladder: Decompressed and poorly visible, reportedly a Foley catheter is in place. Other: Echogenic liver on image 2. IMPRESSION: 1. No hydronephrosis or acute renal finding. Chronic left lower pole nephrolithiasis. 2. Evidence of fatty liver disease. Electronically Signed   By: Genevie Ann M.D.   On: 12/24/2020 11:18   DG Chest Port 1 View  Result Date: 12/29/2020 CLINICAL DATA:  Status post dialysis catheter placement. EXAM:  PORTABLE CHEST 1 VIEW COMPARISON:  Single-view of the chest 12/23/2020. FINDINGS: Left IJ approach dialysis catheter is in place with the tip projecting in the lower superior vena cava. No pneumothorax. Elevation of the right hemidiaphragm is unchanged. Lungs are clear. Heart size is upper normal. No acute or focal bony abnormality. IMPRESSION: Dialysis catheter tip projects in the lower superior vena cava. Negative for pneumothorax or acute disease. Electronically Signed   By: Inge Rise M.D.   On: 12/29/2020 11:42   DG Chest Portable 1 View  Result Date: 12/23/2020 CLINICAL DATA:  Weakness. COVID 2 weeks ago. EXAM: PORTABLE CHEST 1 VIEW COMPARISON:  Two-view chest x-ray 12/03/2018 FINDINGS: Heart is chronically enlarged. Chronic elevation of the right hemidiaphragm noted. No edema or effusion is present. No focal airspace disease is present. Axial skeleton is unremarkable. IMPRESSION: No acute cardiopulmonary disease or significant interval change. Stable cardiomegaly without failure. Electronically Signed   By: San Morelle M.D.   On: 12/23/2020 12:33   ECHOCARDIOGRAM COMPLETE  Result Date: 12/31/2020    ECHOCARDIOGRAM REPORT   Patient Name:   Mark VIRGIN Sr. Date of Exam: 12/31/2020 Medical Rec #:  [id number]            Height:       68.5 in Accession #:    [id number]           Weight:       208.6 lb Date of Birth:  10/29/1940            BSA:          2.092 m Patient Age:    73 years             BP:           146/63 mmHg Patient Gender: M                    HR:           84 bpm. Exam Location:  [facility] Procedure: 2D Echo, Cardiac Doppler and Color Doppler Indications:    Atrial Fibrillation I48.91  History:        Patient has prior history of Echocardiogram examinations, most                 recent 12/12/2011. Risk Factors:Hypertension, Diabetes and                 Dyslipidemia.  Sonographer:    Alvino Chapel RCS Referring Phys: [place]  1. Left ventricular  ejection fraction, by estimation, is 60 to 65%. The left ventricle has normal function. The left ventricle has no regional wall motion abnormalities. There is mild left ventricular hypertrophy. Left ventricular diastolic parameters are consistent with Grade I diastolic dysfunction (impaired relaxation).  2. Right ventricular systolic function is normal. The right ventricular size is normal. There is normal pulmonary artery systolic pressure.  3. The mitral valve is normal in structure. No evidence of mitral valve regurgitation. No evidence of mitral stenosis.  4.  The aortic valve is tricuspid. Aortic valve regurgitation is not visualized. Mild aortic valve sclerosis is present, with no evidence of aortic valve stenosis.  5. Aortic dilatation noted. There is mild dilatation of the aortic root, measuring 39 mm.  6. The inferior vena cava is normal in size with greater than 50% respiratory variability, suggesting right atrial pressure of 3 mmHg. FINDINGS  Left Ventricle: Left ventricular ejection fraction, by estimation, is 60 to 65%. The left ventricle has normal function. The left ventricle has no regional wall motion abnormalities. The left ventricular internal cavity size was normal in size. There is  mild left ventricular hypertrophy. Left ventricular diastolic parameters are consistent with Grade I diastolic dysfunction (impaired relaxation). Right Ventricle: The right ventricular size is normal. Right ventricular systolic function is normal. There is normal pulmonary artery systolic pressure. The tricuspid regurgitant velocity is 2.82 m/s, and with an assumed right atrial pressure of 3 mmHg,  the estimated right ventricular systolic pressure is 74.0 mmHg. Left Atrium: Left atrial size was normal in size. Right Atrium: Right atrial size was normal in size. Pericardium: There is no evidence of pericardial effusion. Mitral Valve: The mitral valve is normal in structure. Mild mitral annular calcification. No  evidence of mitral valve regurgitation. No evidence of mitral valve stenosis. Tricuspid Valve: The tricuspid valve is normal in structure. Tricuspid valve regurgitation is mild . No evidence of tricuspid stenosis. Aortic Valve: The aortic valve is tricuspid. Aortic valve regurgitation is not visualized. Mild aortic valve sclerosis is present, with no evidence of aortic valve stenosis. Pulmonic Valve: The pulmonic valve was normal in structure. Pulmonic valve regurgitation is not visualized. No evidence of pulmonic stenosis. Aorta: Aortic dilatation noted. There is mild dilatation of the aortic root, measuring 39 mm. Venous: The inferior vena cava is normal in size with greater than 50% respiratory variability, suggesting right atrial pressure of 3 mmHg.  LEFT VENTRICLE PLAX 2D LVIDd:         4.80 cm  Diastology LVIDs:         3.30 cm  LV e' medial:    5.66 cm/s LV PW:         1.10 cm  LV E/e' medial:  17.0 LV IVS:        1.50 cm  LV e' lateral:   8.81 cm/s LVOT diam:     2.10 cm  LV E/e' lateral: 10.9 LV SV:         85 LV SV Index:   40 LVOT Area:     3.46 cm  RIGHT VENTRICLE RV S prime:     12.00 cm/s TAPSE (M-mode): 2.1 cm LEFT ATRIUM             Index       RIGHT ATRIUM           Index LA diam:        3.00 cm 1.43 cm/m  RA Area:     16.10 cm LA Vol (A2C):   50.1 ml 23.95 ml/m RA Volume:   43.20 ml  20.65 ml/m LA Vol (A4C):   60.2 ml 28.78 ml/m LA Biplane Vol: 56.1 ml 26.82 ml/m  AORTIC VALVE LVOT Vmax:   110.00 cm/s LVOT Vmean:  79.300 cm/s LVOT VTI:    0.244 m  AORTA Ao Root diam: 3.90 cm MITRAL VALVE                TRICUSPID VALVE MV Area (PHT): 4.08 cm  TR Peak grad:   31.8 mmHg MV Decel Time: 186 msec     TR Vmax:        282.00 cm/s MV E velocity: 96.00 cm/s MV A velocity: 117.00 cm/s  SHUNTS MV E/A ratio:  0.82         Systemic VTI:  0.24 m                             Systemic Diam: 2.10 cm Kirk Ruths MD Electronically signed by Kirk Ruths MD Signature Date/Time: 12/31/2020/10:23:47 AM     Final     Orson Eva, DO  [facility]  If 7PM-7AM, please contact night-coverage [email or web address] Password [email or web address] 01/09/2021, 5:06 PM   LOS: 17 days

## 2021-01-09 NOTE — Progress Notes (Signed)
Patient ID: [name]., male   DOB: January 13, 1941, 80 y.o.   MRN: [id number] [facility] Progress Note   Assessment/ Plan:   1. Acute kidney Injury on chronic kidney disease stage IIIb (baseline creatinine 1.7-2.0): Suspected to have ATN associated with COVID-19 infection and volume depletion.  He is nonoliguric with essentially fixed BUN/creatinine over the past 3 to 4 days suggesting that this is likely the plateau phase of ATN.  Trial dialysis yesterday to help with mental status given possible uremia but despite BUN going down from 90 to 60 his altered mental status is unchanged arguing against uremia is the main cause of his encephalopathy.  We will continue monitoring kidney function daily and decide on dialysis as needed. 2.  Acute metabolic encephalopathy: No better today after dialysis. Likely multifactorial w/ uremia not a significant contributor. Mgmt per primary 3.  Hypertension: Blood pressure currently within acceptable range 4.  Anemia of acute/critical illness: Hgb stable at 9.6.  5.  Metabolic acidosis: Resolved at this time.  Subjective:   Tolerated dialysis yesterday.  Required Activase.  Patient did have episodes of atrial fibrillation with RVR which happens frequently with dialysis.  No change in mental status today.  Unable to answer questions   Objective:   BP 132/85 (BP Location: Left Arm)   Pulse 85   Temp 98.2 F (36.8 C)   Resp 20   Wt 92.7 kg   SpO2 96%   BMI 30.62 kg/m   Intake/Output Summary (Last 24 hours) at 01/09/2021 0856 Last data filed at 01/09/2021 0500 Gross per 24 hour  Intake 720 ml  Output 1298 ml  Net -578 ml   Weight change: 0.1 kg  Physical Exam: Gen: Appears comfortable resting in bed, confused CVS: Normal rate, no obvious murmurs Resp: Bilateral chest rise with no increased work of breathing Abd: Soft, obese, nontender, bowel sounds normal Ext: Warm and well perfused, no lower extremity edema  Imaging: No results  found.  Labs: BMET Recent Labs  Lab 01/03/21 0653 01/04/21 8676 01/05/21 0550 01/06/21 0629 01/07/21 0510 01/08/21 0552 01/09/21 0706  NA 137 139 139 142 140 142 139  K 3.6 3.8 3.6 3.6 3.9 4.0 3.6  CL 103 103 103 103 103 102 102  CO2 24 24 23 26 25 27 28   GLUCOSE 170* 190* 159* 76 164* 192* 178*  BUN 87* 89* 87* 88* 92* 92* 63*  CREATININE 4.41* 4.64* 4.88* 4.87* 4.77* 4.69* 3.56*  CALCIUM 6.4* 6.6* 6.3* 6.7* 6.5* 6.6* 6.6*  PHOS 4.4 4.4 4.7* 4.9* 5.2* 4.8* 3.5   CBC Recent Labs  Lab 01/06/21 0629 01/07/21 1008 01/08/21 0552 01/09/21 0706  WBC 8.4 9.5 9.6 7.4  HGB 7.1* 6.5* 9.5* 9.6*  HCT 23.6* 21.7* 30.3* 30.5*  MCV 101.7* 99.1 97.7 96.8  PLT 136* 139* 145* 159    Medications:    . sodium chloride   Intravenous Once  . alteplase  2 mg Intracatheter Once  . alteplase  2 mg Intracatheter Once  . aspirin EC  81 mg Oral Daily  . calcium carbonate  800 mg of elemental calcium Oral TID WC  . Chlorhexidine Gluconate Cloth  6 each Topical Daily  . Chlorhexidine Gluconate Cloth  6 each Topical Q0600  . cholecalciferol  2,000 Units Oral Daily  . darbepoetin (ARANESP) injection - NON-DIALYSIS  100 mcg Subcutaneous Q Tue-1800  . diltiazem  120 mg Oral Daily  . insulin aspart  0-5 Units Subcutaneous QHS  . insulin  aspart  0-9 Units Subcutaneous TID WC  . insulin aspart  3 Units Subcutaneous TID WC  . lipase/protease/amylase  12,000 Units Oral TID AC  . mouth rinse  15 mL Mouth Rinse BID  . metoprolol tartrate  50 mg Oral BID  . saccharomyces boulardii  250 mg Oral BID  . sodium bicarbonate  1,300 mg Oral BID   Reesa Chew  01/09/2021, 8:56 AM

## 2021-01-09 NOTE — Progress Notes (Signed)
Physical Therapy Treatment Patient Details Name: Mark DORO Sr. MRN: [id number] DOB: [birth year] Today's Date: 01/09/2021    History of Present Illness Mark L Azer Sr. is a 80 y.o. male with medical history significant for type 2 diabetes, CKD stage IIIb, hypertension, dyslipidemia, diastolic CHF, and CAD described to the ED via EMS with increasing weakness and confusion along with poor appetite for the last 2 to 3 days.  Symptoms have been present over the past week and patient was diagnosed with COVID-19 infection on May 7.  He saw his PCP on 5/19 for confusion and was not noted to be hypoxemic at that time.  He was told to hold his Lasix and encouraged to drink plenty of fluids, but he really has not been able to drink very much fluids according to the wife at the bedside.  He was started on some mild delirium related to his COVID infection.  Unfortunately, his symptoms have progressed significantly to days.  Patient cannot give any further history at this time given his severe confusion.  Primary historian his wife at bedside.    PT Comments    Patient presents in bed and agreeable for therapy even after sitting up in early AM with nursing staff assisting.  Patient demonstrates slow labored movement for sitting up at bedside requiring use of bed rail with Mod assist to go from side lying to sitting, requires repeated verbal cueing and demonstration to complete BLE exercises while seated at bedside, had difficulty attempting bilateral  toe raises possibly due to limited ankle dorsiflexion and demonstrates increased endurance/distance for taking steps at bedside, limited mostly due to fatigue and mild SOB.  Patient tolerated sitting up in chair after therapy with his spouse present in room.  Patient will benefit from continued physical therapy in hospital and recommended venue below to increase strength, balance, endurance for safe ADLs and gait.    Follow Up Recommendations  SNF;Supervision  - Intermittent;Supervision for mobility/OOB     Equipment Recommendations  None recommended by PT    Recommendations for Other Services       Precautions / Restrictions Precautions Precautions: Fall Restrictions Weight Bearing Restrictions: No    Mobility  Bed Mobility Overal bed mobility: Needs Assistance Bed Mobility: Rolling;Sidelying to Sit Rolling: Min assist Sidelying to sit: Mod assist       General bed mobility comments: increased time, labored movement, had to use bed rail    Transfers Overall transfer level: Needs assistance Equipment used: Rolling walker (2 wheeled) Transfers: Sit to/from [facility] Sit to Stand: Mod assist Stand pivot transfers: Mod assist       General transfer comment: has difficulty completing sit to stands due to BLE weakness  Ambulation/Gait Ambulation/Gait assistance: Mod assist Gait Distance (Feet): 10 Feet Assistive device: Rolling walker (2 wheeled) Gait Pattern/deviations: Decreased step length - right;Decreased step length - left;Decreased stride length Gait velocity: decreased   General Gait Details: able to take up to 10 steps with slow labored movement for side stepping and stepping forward/backwards without loss of balance, limited mostly due to c/o fatigue   Stairs             Wheelchair Mobility    Modified Rankin (Stroke Patients Only)       Balance Overall balance assessment: Needs assistance Sitting-balance support: Feet supported;No upper extremity supported Sitting balance-Leahy Scale: Fair Sitting balance - Comments: fair/good seated at EOB   Standing balance support: During functional activity;Bilateral upper extremity supported Standing balance-Leahy Scale:  Poor Standing balance comment: fair/poor using RW                            Cognition Arousal/Alertness: Awake/alert Behavior During Therapy: WFL for tasks assessed/performed Overall Cognitive Status:  Within Functional Limits for tasks assessed                                        Exercises General Exercises - Lower Extremity Long Arc Quad: AROM;Strengthening;Seated;Both;15 reps Hip Flexion/Marching: AROM;Seated;Strengthening;Both;10 reps Heel Raises: AROM;Seated;Strengthening;Both;15 reps    General Comments        Pertinent Vitals/Pain Pain Assessment: No/denies pain    Home Living                      Prior Function            PT Goals (current goals can now be found in the care plan section) Acute Rehab PT Goals Patient Stated Goal: return home after rehab PT Goal Formulation: With patient Time For Goal Achievement: 01/16/21 Potential to Achieve Goals: Good Progress towards PT goals: Progressing toward goals    Frequency    Min 3X/week      PT Plan Current plan remains appropriate    Co-evaluation              AM-PAC PT "6 Clicks" Mobility   Outcome Measure  Help needed turning from your back to your side while in a flat bed without using bedrails?: A Lot Help needed moving from lying on your back to sitting on the side of a flat bed without using bedrails?: A Lot Help needed moving to and from a bed to a chair (including a wheelchair)?: A Lot Help needed standing up from a chair using your arms (e.g., wheelchair or bedside chair)?: A Lot Help needed to walk in hospital room?: A Lot Help needed climbing 3-5 steps with a railing? : Total 6 Click Score: 11    End of Session   Activity Tolerance: Patient tolerated treatment well;Patient limited by fatigue Patient left: in chair;with call bell/phone within reach;with chair alarm set Nurse Communication: Mobility status PT Visit Diagnosis: Unsteadiness on feet (R26.81);Other abnormalities of gait and mobility (R26.89);Muscle weakness (generalized) (M62.81)     Time: 1856-3149 PT Time Calculation (min) (ACUTE ONLY): 22 min  Charges:  $Therapeutic Exercise: 8-22  mins $Therapeutic Activity: 8-22 mins                     12:16 PM, 01/09/21 Lonell Grandchild, MPT Physical Therapist with [facility] [phone] office [phone] mobile phone

## 2021-01-09 NOTE — TOC Progression Note (Addendum)
Transition of Care (TOC) - Progression Note    Patient Details  Name: [name] Sr. MRN: [id number] Date of Birth: [birth year]  Transition of Care [facility]) CM/SW Contact  Ihor Gully, LCSW Phone Number: 01/09/2021, 4:35 PM  Clinical Narrative:    Patient authorized, Josem Kaufmann [phone]. EMS auth [id number].  Auth good for 5 days.   Expected Discharge Plan: Skilled Nursing Facility Barriers to Discharge: Continued Medical Work up  Expected Discharge Plan and Services Expected Discharge Plan: [place]: [place] arrangements for the past 2 months: Single Family Home                                       Social Determinants of Health (SDOH) Interventions    Readmission Risk Interventions Readmission Risk Prevention Plan 12/04/2018  Transportation Screening Complete  PCP or Specialist Appt within 3-5 Days (No Data)  [place] or [place] Complete  Social Work Consult for [place] Planning/Counseling Complete  Palliative Care Screening Not Applicable  Medication Review Press photographer) Complete  Some recent data might be hidden

## 2021-01-10 LAB — RENAL FUNCTION PANEL
Albumin: 2.1 g/dL — ABNORMAL LOW (ref 3.5–5.0)
Anion gap: 11 (ref 5–15)
BUN: 79 mg/dL — ABNORMAL HIGH (ref 8–23)
CO2: 26 mmol/L (ref 22–32)
Calcium: 6.7 mg/dL — ABNORMAL LOW (ref 8.9–10.3)
Chloride: 104 mmol/L (ref 98–111)
Creatinine, Ser: 4.12 mg/dL — ABNORMAL HIGH (ref 0.61–1.24)
GFR, Estimated: 14 mL/min — ABNORMAL LOW (ref >60)
Glucose, Bld: 174 mg/dL — ABNORMAL HIGH (ref 70–99)
Phosphorus: 4.1 mg/dL (ref 2.5–4.6)
Potassium: 3.2 mmol/L — ABNORMAL LOW (ref 3.5–5.1)
Sodium: 141 mmol/L (ref 135–145)

## 2021-01-10 LAB — CBC
HCT: 32.3 % — ABNORMAL LOW (ref 39.0–52.0)
Hemoglobin: 9.7 g/dL — ABNORMAL LOW (ref 13.0–17.0)
MCH: 29.9 pg (ref 26.0–34.0)
MCHC: 30 g/dL (ref 30.0–36.0)
MCV: 99.7 fL (ref 80.0–100.0)
Platelets: 170 10*3/uL (ref 150–400)
RBC: 3.24 MIL/uL — ABNORMAL LOW (ref 4.22–5.81)
RDW: 14.3 % (ref 11.5–15.5)
WBC: 8.3 10*3/uL (ref 4.0–10.5)
nRBC: 0 % (ref 0.0–0.2)

## 2021-01-10 LAB — GLUCOSE, CAPILLARY
Glucose-Capillary: 174 mg/dL — ABNORMAL HIGH (ref 70–99)
Glucose-Capillary: 166 mg/dL — ABNORMAL HIGH (ref 70–99)
Glucose-Capillary: 237 mg/dL — ABNORMAL HIGH (ref 70–99)
Glucose-Capillary: 250 mg/dL — ABNORMAL HIGH (ref 70–99)

## 2021-01-10 MED ORDER — PRAVASTATIN SODIUM 10 MG PO TABS
10.0000 mg | ORAL_TABLET | Freq: Every day | ORAL | Status: DC
  Administered 2021-01-10 – 2021-01-23 (×14): 10 mg via ORAL
  Filled 2021-01-10 (×15): qty 1

## 2021-01-10 MED ORDER — POTASSIUM CHLORIDE CRYS ER 20 MEQ PO TBCR
30.0000 meq | EXTENDED_RELEASE_TABLET | Freq: Once | ORAL | Status: AC
  Administered 2021-01-10: 30 meq via ORAL
  Filled 2021-01-10: qty 1

## 2021-01-10 NOTE — Consult Note (Addendum)
Cardiology Consultation:   Patient ID: Mark Jumbo Sr. MRN: [id number]; DOB: 10-13-1940  Admit date: 12/23/2020 Date of Consult: 01/10/2021  PCP:  Mark Frizzle, MD   [facility] Providers Cardiologist: Previously followed by Dr. Domenic Polite (last visit in 2013)   Patient Profile:   Mark Jumbo Sr. is a 80 y.o. male with past medical history of CAD (coronary calcifications by prior CT with low-risk NST in 2013 - no known intervention), HTN, HLD, Type 2 DM and Stage 3 CKD who is being seen today for the evaluation of SVT at the request of Dr. Carles Collet.  History of Present Illness:   [name] initially presented to [facility] ED on 12/23/2020 for evaluation of worsening weakness, confusion and decreased oral intake after having been diagnosed with COVID-19 approximately 2 weeks prior. He was found to severe metabolic acidosis in the setting of an AKI with creatinine elevated to 6.87 (prior baseline of 1.7 - 2.0). He was treated with Remdesivir on admission but did not require further treatment of his COVID-19. Nephrology followed the patient closely and unfortunately his renal function did not improve and HD was initiated on 12/29/2020.  By review of Hospitalist notes, he experienced atrial fibrillation with RVR initially on 5/28 but an EKG was not obtained. Was reported as being asymptomatic. He did have an echocardiogram on 12/31/2020 which showed a preserved EF of 60-65% with no regional WMA. Noted to have mild LVH, Grade 1 DD, normal RV function, mild aortic sclerosis without stenosis and mild dilation of the aortic root at 39 mm. LA and RA were normal in size. He was started on Lopressor 10m BID with improvement in his HR (had been on Coreg 211mBID prior to admission but was previously held due to hypotension).    On 01/07/2021, he was noted to have recurrent tachycardia but his Hgb had also declined to 6.5 and he received 2 units pRBC's. HD notes mention he was in atrial  fibrillation with RVR on 6/6 as well but no EKG was obtained. He was continued on Lopressor with Cardizem CD 12080maily being added to his medication regimen. As of this morning, his Hbg has improved to 9.7. BMET shows K+ is low at 3.2 with creatinine at 4.12.  In talking with the patient today, he denies any recent chest pain or palpitations. Does not recall being told he had an elevated HR this admission (was having uremic symptoms several days ago by review of notes). He feels that his breathing is at baseline. No reported orthopnea or PND.   Past Medical History:  Diagnosis Date  . Arthritis    lower back  . Chronic back pain    Disc disease  . Chronic kidney disease   . Coronary atherosclerosis of native coronary artery    Coronary calcifications by chest CT, Myoview demonstrating inferolateral scar  . Deafness in right ear   . Diabetes mellitus   . Diabetic retinopathy ([place]  moderate nonproliferative with macular edema in right eye  . Diastolic dysfunction 5/21/8563Grade 1.  . Essential hypertension, benign   . Heart murmur    in past  . HOH (hard of hearing)   . Hyperkalemia   . Kidney stones   . Mixed hyperlipidemia   . NAFLD (nonalcoholic fatty liver disease)   . Pancreatic insufficiency    [facility] [place] [place]  . Shortness of breath dyspnea    occasional - liver  pressing on right lung, decreased capacity  . Subdural hematoma ([place])   . Type 2 diabetes mellitus ([place])   . Wears hearing aid    "crossover" form right to left    Past Surgical History:  Procedure Laterality Date  . APPENDECTOMY    . Bilateral hip replacement    . CATARACT EXTRACTION W/PHACO Left 05/15/2015   Procedure: CATARACT EXTRACTION PHACO AND INTRAOCULAR LENS PLACEMENT (IOC);  Surgeon: Ronnell Freshwater, MD;  Location: [place];  Service: Ophthalmology;  Laterality: Left;  DIABETIC - insulin pump and oral meds  . CATARACT EXTRACTION W/PHACO Right  08/28/2015   Procedure: CATARACT EXTRACTION PHACO AND INTRAOCULAR LENS PLACEMENT (IOC);  Surgeon: Ronnell Freshwater, MD;  Location: [place];  Service: Ophthalmology;  Laterality: Right;  DIABETIC PER PT AND CINDY PLEASE KEEP ARRIVAL TIME AFTER 8AM   . CHOLECYSTECTOMY    . HERNIA REPAIR    . PROSTATECTOMY  2011     Home Medications:  Prior to Admission medications   Medication Sig Start Date End Date Taking? Authorizing Provider  albuterol (PROVENTIL HFA;VENTOLIN HFA) 108 (90 Base) MCG/ACT inhaler Inhale 2 puffs into the lungs every 4 (four) hours as needed for wheezing or shortness of breath. 07/24/18  Yes Mark Frizzle, MD  aspirin EC 81 MG tablet Take 81 mg by mouth at bedtime.    Yes [provider]  carvedilol (COREG) 25 MG tablet TAKE 1 TABLET(25 MG) BY MOUTH TWICE DAILY WITH A MEAL 03/22/20  Yes Mark Frizzle, MD  Continuous Blood Gluc Receiver (FREESTYLE LIBRE 2 READER) DEVI Use 1 Device as directed 04/17/20  Yes [provider]  doxazosin (CARDURA) 8 MG tablet TAKE 1 TABLET(8 MG) BY MOUTH DAILY Patient taking differently: Take 8 mg by mouth daily. 04/26/20  Yes Mark Frizzle, MD  ergocalciferol (VITAMIN D2) 1.25 MG (50000 UT) capsule Take 50,000 Units by mouth once a week.   Yes [provider]  furosemide (LASIX) 20 MG tablet TAKE 1 AND 1/2 TABLETS(30 MG) BY MOUTH DAILY Patient taking differently: Take 30 mg by mouth daily. 05/18/20  Yes Mark Frizzle, MD  insulin regular (NOVOLIN R,HUMULIN R) 100 units/mL injection Inject 8 Units into the skin 3 (three) times daily before meals. 12u am  10u supper   Yes [provider]  ketoconazole (NIZORAL) 2 % cream Apply to both feet and between toes once daily for 4 weeks. 11/08/20  Yes Marzetta Board, DPM  Multiple Vitamin (MULTIVITAMIN WITH MINERALS) TABS Take 1 tablet by mouth daily.   Yes [provider]  pravastatin (PRAVACHOL) 10 MG tablet TAKE 1 TABLET(10 MG)  BY MOUTH DAILY WITH BREAKFAST Patient taking differently: Take 10 mg by mouth daily before breakfast. 12/04/20  Yes Pickard, Cammie Mcgee, MD  valACYclovir (VALTREX) 1000 MG tablet Take 1 tablet (1,000 mg total) by mouth 3 (three) times daily. 08/20/19  Yes Pickard, Cammie Mcgee, MD  ZENPEP 10000-32000 units CPEP TAKE 1 CAPSULE BY MOUTH THREE TIMES DAILY BEFORE MEALS Patient taking differently: Take 1 capsule by mouth 3 (three) times daily before meals. 05/10/20  Yes Mark Frizzle, MD  diazepam (VALIUM) 5 MG tablet TAKE 1 TABLET BY MOUTH TWICE DAILY AS NEEDED FOR ANXIETY 12/08/20   Mark Frizzle, MD    Inpatient Medications: Scheduled Meds: . sodium chloride   Intravenous Once  . alteplase  2 mg Intracatheter Once  . alteplase  2 mg Intracatheter Once  . aspirin EC  81 mg  Oral Daily  . calcium carbonate  800 mg of elemental calcium Oral TID WC  . Chlorhexidine Gluconate Cloth  6 each Topical Daily  . Chlorhexidine Gluconate Cloth  6 each Topical Q0600  . cholecalciferol  2,000 Units Oral Daily  . darbepoetin (ARANESP) injection - NON-DIALYSIS  100 mcg Subcutaneous Q Tue-1800  . diltiazem  120 mg Oral Daily  . insulin aspart  0-5 Units Subcutaneous QHS  . insulin aspart  0-9 Units Subcutaneous TID WC  . insulin aspart  3 Units Subcutaneous TID WC  . lidocaine  1 patch Transdermal Daily  . lipase/protease/amylase  12,000 Units Oral TID AC  . mouth rinse  15 mL Mouth Rinse BID  . metoprolol tartrate  50 mg Oral BID  . saccharomyces boulardii  250 mg Oral BID  . sodium bicarbonate  1,300 mg Oral BID   Continuous Infusions: . sodium chloride    . sodium chloride     PRN Meds: sodium chloride, sodium chloride, acetaminophen **OR** acetaminophen, dextrose, heparin, heparin, ondansetron **OR** ondansetron (ZOFRAN) IV  Allergies:    Allergies  Allergen Reactions  . Enalapril Maleate Cough    Social History:   Social History   Socioeconomic History  . Marital status: Married    Spouse  name: Not on file  . Number of children: Not on file  . Years of education: Not on file  . Highest education level: Not on file  Occupational History  . Not on file  Tobacco Use  . Smoking status: Never Smoker  . Smokeless tobacco: Never Used  Substance and Sexual Activity  . Alcohol use: No  . Drug use: No  . Sexual activity: Not on file  Other Topics Concern  . Not on file  Social History Narrative  . Not on file   Social Determinants of Health   Financial Resource Strain: Not on file  Food Insecurity: Not on file  Transportation Needs: Not on file  Physical Activity: Not on file  Stress: Not on file  Social Connections: Not on file  Intimate Partner Violence: Not on file    Family History:    Family History  Problem Relation Age of Onset  . Diabetes type II Mother   . Heart attack Father      ROS:  Please see the history of present illness.   All other ROS reviewed and negative.     Physical Exam/Data:   Vitals:   01/09/21 1305 01/09/21 2005 01/10/21 0238 01/10/21 0355  BP: 133/69 126/75  (!) 136/98  Pulse: 89 93  91  Resp: 20 20  19   Temp: 98.2 F (36.8 C) 99 F (37.2 C)  98.2 F (36.8 C)  TempSrc: Oral Oral    SpO2: 97% 94%  99%  Weight:   92.8 kg     Intake/Output Summary (Last 24 hours) at 01/10/2021 0857 Last data filed at 01/10/2021 0840 Gross per 24 hour  Intake 956 ml  Output 800 ml  Net 156 ml   Last 3 Weights 01/10/2021 01/09/2021 01/08/2021  Weight (lbs) 204 lb 9.4 oz 204 lb 5.9 oz 204 lb 2.3 oz  Weight (kg) 92.8 kg 92.7 kg 92.6 kg     Body mass index is 30.65 kg/m.  General:  Well nourished, well developed male appearing in no acute distress. Hard of hearing.  HEENT: normal Lymph: no adenopathy Neck: no JVD Endocrine:  No thryomegaly Vascular: No carotid bruits; FA pulses 2+ bilaterally without bruits  Cardiac:  normal S1,  S2; RRR; no murmur.  Lungs:  clear to auscultation bilaterally, no wheezing, rhonchi or rales  Abd: soft,  nontender, no hepatomegaly  Ext: 1+ pitting edema bilaterally Musculoskeletal:  No deformities, BUE and BLE strength normal and equal Skin: warm and dry  Neuro:  CNs 2-12 intact, no focal abnormalities noted Psych:  Normal affect   EKG:  The EKG was personally reviewed and demonstrates: NSR, HR 79 with 1st degree AV block and RBBB.    Relevant CV Studies:  NST: 12/2011 IMPRESSION:  Low risk Lexiscan Myoview as outlined. There were no diagnostic ST-  segment changes. Occasional PVCs noted without sustained  arrhythmia. Perfusion imaging shows a small inferior lateral  defect at the apex, possibly related to diaphragmatic attenuation  although cannot entirely exclude scar. No large degree of ischemia  noted with LVEF of 64%.   Echocardiogram: 12/2020 IMPRESSIONS    1. Left ventricular ejection fraction, by estimation, is 60 to 65%. The  left ventricle has normal function. The left ventricle has no regional  wall motion abnormalities. There is mild left ventricular hypertrophy.  Left ventricular diastolic parameters  are consistent with Grade I diastolic dysfunction (impaired relaxation).  2. Right ventricular systolic function is normal. The right ventricular  size is normal. There is normal pulmonary artery systolic pressure.  3. The mitral valve is normal in structure. No evidence of mitral valve  regurgitation. No evidence of mitral stenosis.  4. The aortic valve is tricuspid. Aortic valve regurgitation is not  visualized. Mild aortic valve sclerosis is present, with no evidence of  aortic valve stenosis.  5. Aortic dilatation noted. There is mild dilatation of the aortic root,  measuring 39 mm.  6. The inferior vena cava is normal in size with greater than 50%  respiratory variability, suggesting right atrial pressure of 3 mmHg.   Laboratory Data:  High Sensitivity Troponin:   Recent Labs  Lab 12/23/20 1153 12/23/20 1324  TROPONINIHS 43* 41*      Chemistry Recent Labs  Lab 01/08/21 0552 01/09/21 0706 01/10/21 0542  NA 142 139 141  K 4.0 3.6 3.2*  CL 102 102 104  CO2 27 28 26   GLUCOSE 192* 178* 174*  BUN 92* 63* 79*  CREATININE 4.69* 3.56* 4.12*  CALCIUM 6.6* 6.6* 6.7*  GFRNONAA 12* 17* 14*  ANIONGAP 13 9 11     Recent Labs  Lab 01/08/21 0552 01/09/21 0706 01/10/21 0542  ALBUMIN 2.2* 2.1* 2.1*   Hematology Recent Labs  Lab 01/08/21 0552 01/09/21 0706 01/10/21 0542  WBC 9.6 7.4 8.3  RBC 3.10*  3.09* 3.15* 3.24*  HGB 9.5* 9.6* 9.7*  HCT 30.3* 30.5* 32.3*  MCV 97.7 96.8 99.7  MCH 30.6 30.5 29.9  MCHC 31.4 31.5 30.0  RDW 14.1 14.2 14.3  PLT 145* 159 170   BNPNo results for input(s): BNP, PROBNP in the last 168 hours.  DDimer No results for input(s): DDIMER in the last 168 hours.   Radiology/Studies:  No results found.   Assessment and Plan:    1. SVT/Atrial Fibrillation with RVR - Prior notes mention a history of both arrhythmias this admission but I am unable to locate rhythm strips or an EKG confirming his arrhythmias. Saved rhythm strips show 1st degree AV block with PVC's and episodes of NSVT but no definitive atrial fibrillation.  - He would be at risk of atrial fibrillation due to his multiple medical issues including his AKI, recent COVID-19 diagnosis, electrolyte abnormalities and his anemia requiring transfusions this admission.  -  Currently he is in NSR with HR in the 80's to 90's. Would continue to monitor on telemetry this admission. Continue Cardizem CD 155m daily and Lopressor 563mBID with plans to consolidate to one medication as an outpatient given his underlying conduction disease (1st degree AV block and RBBB). TSH WNL this admission. Keep K+ around 4.0 and Mg at 2.0. - His CHA2DS2-VASc Score is elevated to 5 but would not plan to commit to full-dose anticoagulation at this time given no definitive atrial fibrillation thus far. Will review with MD as well. Could consider an outpatient  monitor to assess for recurrence.   2. CAD - Documented as having coronary calcifications by prior CT with low-risk NST in 2013. He has not required intervention by review of records.  - He denies any recent anginal symptoms. Echo this admission showed a preserved EF with no regional WMA. - Continue ASA and BB therapy. Would recommend restarting his PTA statin (LFT's WNL on most recent check).   3. Acute on Chronic Stage 3 CKD - He has required initiation of HD this admission. Nephrology following.   4. Anemia - Hgb was at 6.5 as of 6/5 and he received 2 units pRBC's. Hgb stable at 9.6 today.   5. Dilation of Aortic Root - Echo this admission showed mild dilation of the aortic root at 39 mm. Continue to follow as an outpatient.   6. HTN - BP has been stable at 126/75 - 136/98 within the past 24 hours. Remains on Cardizem CD 12023maily and Lopressor 11m49mD at this time.   7. COVID-19 Infection - Treated with Remdesivir on admission. Denies any current respiratory issues and oxygen saturation has remained appropriate. No longer on isolation precautions.    Risk Assessment/Risk Scores:     CHA2DS2-VASc Score = 5  This indicates a 7.2% annual risk of stroke. The patient's score is based upon: CHF History: No HTN History: Yes Diabetes History: Yes Stroke History: No Vascular Disease History: Yes Age Score: 2 Gender Score: 0  For questions or updates, please contact [facility] consult [email or web address] for contact info under    Signed, BritErma Heritage-C  01/10/2021 8:57 AM

## 2021-01-10 NOTE — Progress Notes (Addendum)
Patient ID: [name]., male   DOB: 1940-12-29, 80 y.o.   MRN: [id number] [facility] Progress Note   Assessment/ Plan:   1. Acute kidney Injury on chronic kidney disease stage IIIb (baseline creatinine 1.7-2.0): Suspected to have ATN associated with COVID-19 infection and volume depletion.  Likely in plateau phase of ATN.  Intermittent confusion has pushed [place] to do dialysis to rule out uremia last done on 6/6.  Minimal improvement in mental status yesterday but much improved today.  The patient and I discussed his plan for dialysis today and that we will continue to try and hold off in between dialysis sessions as long as possible to monitor for recovery.  If his BUN and creatinine continue to rise, BUN remains around 90, patient has persistent uremic symptoms he may need to be discharged on dialysis.  Hopefully we can determine this by Friday. We will continue monitoring kidney function daily and decide on dialysis as needed. 2.  Acute metabolic encephalopathy: Improved today and fluctuating.  Did not improve the day after dialysis so uremia likely not the only contributor.  Likely delirium from multiple factors. 3.  Hypertension: Blood pressure currently within acceptable range 4.  Anemia of acute/critical illness: Hgb stable at 9.7.  5.  Metabolic acidosis: Resolved at this time.  Subjective:   The patient is much more awake and alert today.  States that he is in [facility] in [place].  Has fair insight to his clinical situation.  Denies any complaints.  He feels like he is urinating a lot.  Creatinine was decreased yesterday after dialysis now 4.1 today.  BUN increased to 79.   Objective:   BP (!) 136/98   Pulse 91   Temp 98.2 F (36.8 C)   Resp 19   Wt 92.8 kg   SpO2 99%   BMI 30.65 kg/m   Intake/Output Summary (Last 24 hours) at 01/10/2021 2130 Last data filed at 01/10/2021 0840 Gross per 24 hour  Intake 716 ml  Output 800 ml  Net -84 ml    Weight change: 0.2 kg  Physical Exam: Gen: Appears comfortable resting in bed, no distress CVS: Normal rate, no obvious murmurs Resp: Bilateral chest rise with no increased work of breathing Abd: Soft, obese, nontender, bowel sounds normal Ext: Warm and well perfused, trace lower extremity edema  Imaging: No results found.  Labs: BMET Recent Labs  Lab 01/04/21 [phone] 01/05/21 0550 01/06/21 0629 01/07/21 0510 01/08/21 0552 01/09/21 0706 01/10/21 0542  NA 139 139 142 140 142 139 141  K 3.8 3.6 3.6 3.9 4.0 3.6 3.2*  CL 103 103 103 103 102 102 104  CO2 24 23 26 25 27 28 26   GLUCOSE 190* 159* 76 164* 192* 178* 174*  BUN 89* 87* 88* 92* 92* 63* 79*  CREATININE 4.64* 4.88* 4.87* 4.77* 4.69* 3.56* 4.12*  CALCIUM 6.6* 6.3* 6.7* 6.5* 6.6* 6.6* 6.7*  PHOS 4.4 4.7* 4.9* 5.2* 4.8* 3.5 4.1   CBC Recent Labs  Lab 01/07/21 1008 01/08/21 0552 01/09/21 0706 01/10/21 0542  WBC 9.5 9.6 7.4 8.3  HGB 6.5* 9.5* 9.6* 9.7*  HCT 21.7* 30.3* 30.5* 32.3*  MCV 99.1 97.7 96.8 99.7  PLT 139* 145* 159 170    Medications:    . sodium chloride   Intravenous Once  . alteplase  2 mg Intracatheter Once  . alteplase  2 mg Intracatheter Once  . aspirin EC  81 mg Oral Daily  . calcium carbonate  800 mg of elemental calcium Oral TID WC  . Chlorhexidine Gluconate Cloth  6 each Topical Daily  . Chlorhexidine Gluconate Cloth  6 each Topical Q0600  . cholecalciferol  2,000 Units Oral Daily  . darbepoetin (ARANESP) injection - NON-DIALYSIS  100 mcg Subcutaneous Q Tue-1800  . diltiazem  120 mg Oral Daily  . insulin aspart  0-5 Units Subcutaneous QHS  . insulin aspart  0-9 Units Subcutaneous TID WC  . insulin aspart  3 Units Subcutaneous TID WC  . lidocaine  1 patch Transdermal Daily  . lipase/protease/amylase  12,000 Units Oral TID AC  . mouth rinse  15 mL Mouth Rinse BID  . metoprolol tartrate  50 mg Oral BID  . pravastatin  10 mg Oral q1800  . saccharomyces boulardii  250 mg Oral BID  .  sodium bicarbonate  1,300 mg Oral BID   Reesa Chew  01/10/2021, 9:27 AM

## 2021-01-10 NOTE — Progress Notes (Addendum)
Physical Therapy Treatment Patient Details Name: Mark STUCKE Sr. MRN: [id number] DOB: Jan 25, 1941 Today's Date: 01/10/2021    History of Present Illness Mark L Macphail Sr. is a 80 y.o. male with medical history significant for type 2 diabetes, CKD stage IIIb, hypertension, dyslipidemia, diastolic CHF, and CAD described to the ED via EMS with increasing weakness and confusion along with poor appetite for the last 2 to 3 days.  Symptoms have been present over the past week and patient was diagnosed with COVID-19 infection on May 7.  He saw his PCP on 5/19 for confusion and was not noted to be hypoxemic at that time.  He was told to hold his Lasix and encouraged to drink plenty of fluids, but he really has not been able to drink very much fluids according to the wife at the bedside.  He was started on some mild delirium related to his COVID infection.  Unfortunately, his symptoms have progressed significantly to days.  Patient cannot give any further history at this time given his severe confusion.  Primary historian his wife at bedside.    PT Comments    Patient presents in bed and agreeable to therapy. Patient demonstrated fair bed mobility with min assisted to go from supine to seated at EOB. Patient required verbal cues on breathing to slow respiration rate. Patient required mod assist to go from sit to stand with RW. Patient was able to take 4 side steps toward chair then walk forward and backwards for 10 feet with min assist and verbal cues to keep his head up. Patient was left in chair - nursing staff notified. Patient will benefit from continued physical therapy in hospital and recommended venue below to increase strength, balance, endurance for safe ADLs and gait.    Follow Up Recommendations  SNF;Supervision - Intermittent;Supervision for mobility/OOB     Equipment Recommendations  None recommended by PT    Recommendations for Other Services       Precautions / Restrictions  Precautions Precautions: Fall Restrictions Weight Bearing Restrictions: No    Mobility  Bed Mobility Overal bed mobility: Needs Assistance Bed Mobility: Rolling;Sidelying to Sit Rolling: Min assist Sidelying to sit: Mod assist       General bed mobility comments: increased time, labored movement, had to use bed rail Patient Response: Cooperative  Transfers Overall transfer level: Needs assistance Equipment used: Rolling walker (2 wheeled) Transfers: Sit to/from [facility] Sit to Stand: Mod assist Stand pivot transfers: Mod assist       General transfer comment: Patient requires verbal cues on foot placement and push through the walker, has difficulty with standing due to bilateral LE weakness  Ambulation/Gait Ambulation/Gait assistance: Min assist Gait Distance (Feet): 10 Feet Assistive device: Rolling walker (2 wheeled) Gait Pattern/deviations: Decreased step length - right;Decreased step length - left;Decreased stride length Gait velocity: decreased   General Gait Details: able to take up to 10 steps with slow labored movement for side stepping and stepping forward/backwards without loss of balance, limited mostly due to c/o fatigue   Stairs             Wheelchair Mobility    Modified Rankin (Stroke Patients Only)       Balance Overall balance assessment: Needs assistance Sitting-balance support: Feet supported;No upper extremity supported Sitting balance-Leahy Scale: Fair Sitting balance - Comments: fair/good seated at EOB   Standing balance support: During functional activity;Bilateral upper extremity supported Standing balance-Leahy Scale: Poor Standing balance comment: fair/poor using RW  Cognition Arousal/Alertness: Awake/alert Behavior During Therapy: WFL for tasks assessed/performed Overall Cognitive Status: Within Functional Limits for tasks assessed                                         Exercises General Exercises - Lower Extremity Ankle Circles/Pumps: AROM;Strengthening;Both;Seated;15 reps Long Arc Quad: AROM;Strengthening;Seated;Both;15 reps Hip Flexion/Marching: AROM;Seated;Strengthening;Both;15 reps Toe Raises: AROM;Seated;Strengthening;Both;15 reps Heel Raises: AROM;Seated;Strengthening;Both;15 reps    General Comments        Pertinent Vitals/Pain Pain Assessment: No/denies pain    Home Living                      Prior Function            PT Goals (current goals can now be found in the care plan section) Acute Rehab PT Goals Patient Stated Goal: return home after rehab PT Goal Formulation: With patient Time For Goal Achievement: 01/16/21 Potential to Achieve Goals: Good Progress towards PT goals: Progressing toward goals    Frequency    Min 3X/week      PT Plan Current plan remains appropriate    Co-evaluation              AM-PAC PT "6 Clicks" Mobility   Outcome Measure  Help needed turning from your back to your side while in a flat bed without using bedrails?: A Lot Help needed moving from lying on your back to sitting on the side of a flat bed without using bedrails?: A Lot Help needed moving to and from a bed to a chair (including a wheelchair)?: A Lot Help needed standing up from a chair using your arms (e.g., wheelchair or bedside chair)?: A Lot Help needed to walk in hospital room?: A Lot Help needed climbing 3-5 steps with a railing? : Total 6 Click Score: 11    End of Session   Activity Tolerance: Patient tolerated treatment well;Patient limited by fatigue Patient left: in chair;with call bell/phone within reach;with chair alarm set Nurse Communication: Mobility status PT Visit Diagnosis: Unsteadiness on feet (R26.81);Other abnormalities of gait and mobility (R26.89);Muscle weakness (generalized) (M62.81)     Time: 5093-2671 PT Time Calculation (min) (ACUTE ONLY): 30 min  Charges:   $Therapeutic Exercise: 8-22 mins $Therapeutic Activity: 8-22 mins                     3:55 PM, 01/10/21 Sinclair Ship SPT  3:55 PM, 01/10/21 Lonell Grandchild, MPT Physical Therapist with [facility] [phone] office [phone] mobile phone

## 2021-01-10 NOTE — Progress Notes (Signed)
PROGRESS NOTE   [name]  OMB:[id number] DOB: [birth year] DOA: 12/23/2020 PCP: [name], MD   Chief Complaint  Patient presents with  . Weakness   Level of care: Telemetry  Brief Admission History:  80 year old gentleman with type 2 diabetes mellitus, stage IIIb CKD, dyslipidemia, hypertension, diastolic congestive heart failure and coronary artery disease presented with increasing weakness and confusion.  He was recently diagnosed with COVID-19 on 12/09/2020.  He was admitted with acute kidney injury.  Assessment & Plan:   Active Problems:   AKI (acute kidney injury) ([place])   Acute renal failure superimposed on stage 3b chronic kidney disease (HCC)   Acute metabolic encephalopathy   COVID-19 virus infection   Metabolic acidosis   SVT (supraventricular tachycardia) (HCC)   Pressure injury of skin   AKI on CKD stage IIIb- patient is still requiring dialysis at this point.  He has a left internal jugular HD catheter in place since 12/29/2020.  Nephrology is following closely.  He is due for hemodialysis later today.  It is uncertain if he is going to require a longer term course of hemodialysis.  Acute metabolic encephalopathy- this has been intermittent but today he seems much better and oriented.  Continue to monitor closely.  Hypocalcemia-she has been treated with Tums and vitamin D.  Status post calcium gluconate on 01/05/2021.  Anemia in CKD- he was transfused 2 units PRBC on 01/07/2021.  DKA-resolved  Uncontrolled type 2 diabetes mellitus with renal complications-continue SSI coverage and prandial coverage and frequent CBG monitoring.  Recent COVID-19 infection- patient status post 3 days of remdesivir for asymptomatic infection.  He has been stable on room air.  He is now on 30 days post diagnosis and no longer in quarantine.  Thrombocytopenia-resolved.  Hypokalemia- oral replacement ordered, recheck in a.m.  SVT/A. fib with RVR- Cardizem and Lopressor as  ordered by cardiology service.  Coronary artery disease- no chest pain symptoms at this time.  New aspirin and beta-blocker therapy.   Essential hypertension- stable on current medications.  Hyperlipidemia-resume home statin therapy.  He is on pravastatin 10 mg daily.  Aortic root dilatation-this is being followed outpatient with cardiology.  DVT prophylaxis: SCDs Code Status: Full  Family Communication:  Disposition: SNF  Status is: Inpatient  Remains inpatient appropriate because:IV treatments appropriate due to intensity of illness or inability to take PO and Inpatient level of care appropriate due to severity of illness   Dispo: The patient is from: Home              Anticipated d/c is to: SNF              Patient currently is not medically stable to d/c.   Difficult to place patient No    Consultants:   Cardiology  Nephrology   Procedures:     Antimicrobials:     Subjective: Pt more alert and oriented today. No specific complaints.   Objective: Vitals:   01/09/21 1305 01/09/21 2005 01/10/21 0238 01/10/21 0355  BP: 133/69 126/75  (!) 136/98  Pulse: 89 93  91  Resp: 20 20  19   Temp: 98.2 F (36.8 C) 99 F (37.2 C)  98.2 F (36.8 C)  TempSrc: Oral Oral    SpO2: 97% 94%  99%  Weight:   92.8 kg     Intake/Output Summary (Last 24 hours) at 01/10/2021 1111 Last data filed at 01/10/2021 0840 Gross per 24 hour  Intake 716 ml  Output 800 ml  Net -84 ml   Filed Weights   01/08/21 1630 01/09/21 0225 01/10/21 0238  Weight: 92.6 kg 92.7 kg 92.8 kg    Examination:  General exam: Chronically ill-appearing male, appears calm and comfortable  Respiratory system: Clear to auscultation. Respiratory effort normal. Cardiovascular system: normal S1 & S2 heard. No JVD, murmurs, rubs, gallops or clicks. No pedal edema. Gastrointestinal system: Abdomen is nondistended, soft and nontender. No organomegaly or masses felt. Normal bowel sounds heard. Central nervous  system: Alert and oriented. No focal neurological deficits. Extremities: Symmetric 5 x 5 power. Skin: No rashes, lesions or ulcers Psychiatry: Judgement and insight appear poor. Mood & affect flat.   Data Reviewed: I have personally reviewed following labs and imaging studies  CBC: Recent Labs  Lab 01/06/21 0629 01/07/21 1008 01/08/21 0552 01/09/21 0706 01/10/21 0542  WBC 8.4 9.5 9.6 7.4 8.3  HGB 7.1* 6.5* 9.5* 9.6* 9.7*  HCT 23.6* 21.7* 30.3* 30.5* 32.3*  MCV 101.7* 99.1 97.7 96.8 99.7  PLT 136* 139* 145* 159 326    Basic Metabolic Panel: Recent Labs  Lab 01/06/21 0629 01/07/21 0510 01/08/21 0552 01/09/21 0706 01/10/21 0542  NA 142 140 142 139 141  K 3.6 3.9 4.0 3.6 3.2*  CL 103 103 102 102 104  CO2 26 25 27 28 26   GLUCOSE 76 164* 192* 178* 174*  BUN 88* 92* 92* 63* 79*  CREATININE 4.87* 4.77* 4.69* 3.56* 4.12*  CALCIUM 6.7* 6.5* 6.6* 6.6* 6.7*  PHOS 4.9* 5.2* 4.8* 3.5 4.1    GFR: Estimated Creatinine Clearance: 16.2 mL/min (A) (by C-G formula based on SCr of 4.12 mg/dL (H)).  Liver Function Tests: Recent Labs  Lab 01/06/21 0629 01/07/21 0510 01/08/21 0552 01/09/21 0706 01/10/21 0542  ALBUMIN 2.8* 2.3* 2.2* 2.1* 2.1*    CBG: Recent Labs  Lab 01/09/21 0745 01/09/21 1110 01/09/21 1611 01/09/21 2006 01/10/21 0734  GLUCAP 169* 203* 174* 186* 166*    Recent Results (from the past 240 hour(s))  Culture, Urine     Status: Abnormal   Collection Time: 01/05/21  5:22 PM   Specimen: Urine, Clean Catch  Result Value Ref Range Status   Specimen Description   Final    URINE, CLEAN CATCH Performed at [facility], [address], [place], [place] [address]    Special Requests   Final    NONE Performed at [facility], [address], [place], [place] [address]    Culture >=100,000 COLONIES/mL ESCHERICHIA COLI (A)  Final   Report Status 01/08/2021 FINAL  Final   Organism ID, Bacteria ESCHERICHIA COLI (A)  Final      Susceptibility   Escherichia coli -  MIC*    AMPICILLIN >=32 RESISTANT Resistant     CEFAZOLIN <=4 SENSITIVE Sensitive     CEFEPIME <=0.12 SENSITIVE Sensitive     CEFTRIAXONE <=0.25 SENSITIVE Sensitive     CIPROFLOXACIN <=0.25 SENSITIVE Sensitive     GENTAMICIN >=16 RESISTANT Resistant     IMIPENEM <=0.25 SENSITIVE Sensitive     NITROFURANTOIN <=16 SENSITIVE Sensitive     TRIMETH/SULFA >=320 RESISTANT Resistant     AMPICILLIN/SULBACTAM >=32 RESISTANT Resistant     PIP/TAZO <=4 SENSITIVE Sensitive     * >=100,000 COLONIES/mL ESCHERICHIA COLI     Radiology Studies: No results found.  Scheduled Meds: . sodium chloride   Intravenous Once  . alteplase  2 mg Intracatheter Once  . alteplase  2 mg Intracatheter Once  . aspirin EC  81 mg Oral Daily  .  calcium carbonate  800 mg of elemental calcium Oral TID WC  . Chlorhexidine Gluconate Cloth  6 each Topical Daily  . Chlorhexidine Gluconate Cloth  6 each Topical Q0600  . cholecalciferol  2,000 Units Oral Daily  . darbepoetin (ARANESP) injection - NON-DIALYSIS  100 mcg Subcutaneous Q Tue-1800  . diltiazem  120 mg Oral Daily  . insulin aspart  0-5 Units Subcutaneous QHS  . insulin aspart  0-9 Units Subcutaneous TID WC  . insulin aspart  3 Units Subcutaneous TID WC  . lidocaine  1 patch Transdermal Daily  . lipase/protease/amylase  12,000 Units Oral TID AC  . mouth rinse  15 mL Mouth Rinse BID  . metoprolol tartrate  50 mg Oral BID  . potassium chloride  30 mEq Oral Once  . pravastatin  10 mg Oral q1800  . saccharomyces boulardii  250 mg Oral BID  . sodium bicarbonate  1,300 mg Oral BID   Continuous Infusions: . sodium chloride    . sodium chloride      LOS: 18 days   Time spent: 35 mins   Milca Sytsma Wynetta Emery, MD How to contact the [facility] Attending or Consulting provider [place] or covering provider during after hours [place], for this patient?  1. Check the care team in [facility] and look for a) attending/consulting [facility] provider listed and b) the [facility] team listed 2. Log into  [email or web address] and use [facility]'s universal password to access. If you do not have the password, please contact the hospital operator. 3. Locate the [facility] provider you are looking for under [facility] and page to a number that you can be directly reached. 4. If you still have difficulty reaching the provider, please page the [facility] (Director on Call) for the Hospitalists listed on amion for assistance.  01/10/2021, 11:11 AM

## 2021-01-11 LAB — RENAL FUNCTION PANEL
Albumin: 2 g/dL — ABNORMAL LOW (ref 3.5–5.0)
Anion gap: 13 (ref 5–15)
BUN: 84 mg/dL — ABNORMAL HIGH (ref 8–23)
CO2: 24 mmol/L (ref 22–32)
Calcium: 6.7 mg/dL — ABNORMAL LOW (ref 8.9–10.3)
Chloride: 102 mmol/L (ref 98–111)
Creatinine, Ser: 4.31 mg/dL — ABNORMAL HIGH (ref 0.61–1.24)
GFR, Estimated: 13 mL/min — ABNORMAL LOW (ref >60)
Glucose, Bld: 207 mg/dL — ABNORMAL HIGH (ref 70–99)
Phosphorus: 4.5 mg/dL (ref 2.5–4.6)
Potassium: 3.3 mmol/L — ABNORMAL LOW (ref 3.5–5.1)
Sodium: 139 mmol/L (ref 135–145)

## 2021-01-11 LAB — GLUCOSE, CAPILLARY: Glucose-Capillary: 185 mg/dL — ABNORMAL HIGH (ref 70–99)

## 2021-01-11 MED ORDER — POTASSIUM CHLORIDE CRYS ER 20 MEQ PO TBCR
30.0000 meq | EXTENDED_RELEASE_TABLET | Freq: Once | ORAL | Status: AC
  Administered 2021-01-11: 30 meq via ORAL
  Filled 2021-01-11: qty 1

## 2021-01-11 MED ORDER — DILTIAZEM HCL ER COATED BEADS 180 MG PO CP24
180.0000 mg | ORAL_CAPSULE | Freq: Every day | ORAL | Status: DC
  Administered 2021-01-12 – 2021-01-24 (×12): 180 mg via ORAL
  Filled 2021-01-11 (×14): qty 1

## 2021-01-11 NOTE — Progress Notes (Signed)
Patient ID: Mark Hose., male   DOB: [birth year], 80 y.o.   MRN: [id number] [facility] Progress Note   Assessment/ Plan:   1. Acute kidney Injury on chronic kidney disease stage IIIb (baseline creatinine 1.7-2.0): Suspected to have ATN associated with COVID-19 infection and volume depletion.  Likely in plateau phase of ATN.  Intermittent confusion has pushed [place] to do dialysis to rule out uremia last done on 6/6.  Mental status fluctuating intermittently.  The patient and I discussed his plan for dialysis and that we will continue to try and hold off in between dialysis sessions as long as possible to monitor for recovery.  If his BUN and creatinine continue to rise, BUN remains around 90, patient has persistent uremic symptoms he may need to be discharged on dialysis.  Hopefully we can determine this by Friday or early next week. We will continue monitoring kidney function daily and decide on dialysis as needed. 2.  Acute metabolic encephalopathy: Seems to be fluctuating day-to-day likely delirium on top of dementia.  Did not improve the day after dialysis so uremia likely not the only contributor.   3.  Hypertension: Blood pressure currently within acceptable range 4.  Anemia of acute/critical illness: Hgb stable at 9.7.  5.  Metabolic acidosis: Resolved at this time.  Subjective:   Patient states he feels well today.  Slightly more confused.  Mental status seems to be fluctuating.  Denies any complaints.   Objective:   BP (!) 156/83 (BP Location: Right Arm)   Pulse 92   Temp 98 F (36.7 C)   Resp 18   Wt 92.9 kg   SpO2 94%   BMI 30.69 kg/m   Intake/Output Summary (Last 24 hours) at 01/11/2021 0948 Last data filed at 01/11/2021 0820 Gross per 24 hour  Intake 1632 ml  Output 650 ml  Net 982 ml   Weight change: 0.1 kg  Physical Exam: Gen: Appears comfortable resting in bed, no distress CVS: Normal rate, no obvious murmurs Resp: Bilateral chest rise with no  increased work of breathing Abd: Soft, obese, nontender, bowel sounds normal Ext: Warm and well perfused, trace lower extremity edema  Imaging: No results found.  Labs: BMET Recent Labs  Lab 01/05/21 0550 01/06/21 0629 01/07/21 0510 01/08/21 0552 01/09/21 0706 01/10/21 0542 01/11/21 0535  NA 139 142 140 142 139 141 139  K 3.6 3.6 3.9 4.0 3.6 3.2* 3.3*  CL 103 103 103 102 102 104 102  CO2 23 26 25 27 28 26 24   GLUCOSE 159* 76 164* 192* 178* 174* 207*  BUN 87* 88* 92* 92* 63* 79* 84*  CREATININE 4.88* 4.87* 4.77* 4.69* 3.56* 4.12* 4.31*  CALCIUM 6.3* 6.7* 6.5* 6.6* 6.6* 6.7* 6.7*  PHOS 4.7* 4.9* 5.2* 4.8* 3.5 4.1 4.5   CBC Recent Labs  Lab 01/07/21 1008 01/08/21 0552 01/09/21 0706 01/10/21 0542  WBC 9.5 9.6 7.4 8.3  HGB 6.5* 9.5* 9.6* 9.7*  HCT 21.7* 30.3* 30.5* 32.3*  MCV 99.1 97.7 96.8 99.7  PLT 139* 145* 159 170    Medications:     sodium chloride   Intravenous Once   alteplase  2 mg Intracatheter Once   alteplase  2 mg Intracatheter Once   aspirin EC  81 mg Oral Daily   calcium carbonate  800 mg of elemental calcium Oral TID WC   Chlorhexidine Gluconate Cloth  6 each Topical Daily   Chlorhexidine Gluconate Cloth  6 each Topical Q0600   cholecalciferol  2,000 Units Oral Daily   darbepoetin (ARANESP) injection - NON-DIALYSIS  100 mcg Subcutaneous Q Tue-1800   [START ON 01/12/2021] diltiazem  180 mg Oral Daily   insulin aspart  0-5 Units Subcutaneous QHS   insulin aspart  0-9 Units Subcutaneous TID WC   insulin aspart  3 Units Subcutaneous TID WC   lidocaine  1 patch Transdermal Daily   lipase/protease/amylase  12,000 Units Oral TID AC   mouth rinse  15 mL Mouth Rinse BID   metoprolol tartrate  50 mg Oral BID   pravastatin  10 mg Oral q1800   saccharomyces boulardii  250 mg Oral BID   sodium bicarbonate  1,300 mg Oral BID   Reesa Chew  01/11/2021, 9:48 AM

## 2021-01-11 NOTE — Progress Notes (Signed)
Patient is up in chair for lunch, tolerating well. Wife is at bedside and call bell is within reach.

## 2021-01-11 NOTE — Progress Notes (Addendum)
Physical Therapy Treatment Patient Details Name: Mark QUALLEY Sr. MRN: [id number] DOB: March 08, 1941 Today's Date: 01/11/2021    History of Present Illness Mark L Vining Sr. is a 80 y.o. male with medical history significant for type 2 diabetes, CKD stage IIIb, hypertension, dyslipidemia, diastolic CHF, and CAD described to the ED via EMS with increasing weakness and confusion along with poor appetite for the last 2 to 3 days.  Symptoms have been present over the past week and patient was diagnosed with COVID-19 infection on May 7.  He saw his PCP on 5/19 for confusion and was not noted to be hypoxemic at that time.  He was told to hold his Lasix and encouraged to drink plenty of fluids, but he really has not been able to drink very much fluids according to the wife at the bedside.  He was started on some mild delirium related to his COVID infection.  Unfortunately, his symptoms have progressed significantly to days.  Patient cannot give any further history at this time given his severe confusion.  Primary historian his wife at bedside.    PT Comments    Patient presents awake and alert in bed willing and motivated to participate in therapy. Patient requires min/mod assist to sit up at EOB. Patient preformed therapeutic activities including: heel raises, toe raises. Long arc quads, and seated marching. Patient was abel to transfer from sit to stand with mod assist and was able to ambulate 20 feet at bedside with a noted improved pace and quality of gait. During therapy patient was noted to have improved respiration and a decrease in SOB reports. Patient tolerated sitting up in chair after therapy with his spouse present. Patient will benefit from continued physical therapy in hospital and recommended venue below to increase strength, balance, endurance for safe ADLs and gait.     Follow Up Recommendations  SNF;Supervision - Intermittent;Supervision for mobility/OOB     Equipment Recommendations   None recommended by PT    Recommendations for Other Services       Precautions / Restrictions Precautions Precautions: Fall Restrictions Weight Bearing Restrictions: No    Mobility  Bed Mobility Overal bed mobility: Needs Assistance Bed Mobility: Rolling;Sidelying to Sit Rolling: Min assist Sidelying to sit: Mod assist Supine to sit: Min assist     General bed mobility comments: increased time, labored movement, had to use bed rail Patient Response: Cooperative  Transfers Overall transfer level: Needs assistance Equipment used: Rolling walker (2 wheeled) Transfers: Sit to/from [facility] Sit to Stand: Mod assist;Min assist Stand pivot transfers: Min assist       General transfer comment: Patient requires verbal cues on foot placement and push through the walker, has difficulty with standing due to bilateral LE weakness  Ambulation/Gait Ambulation/Gait assistance: Min assist Gait Distance (Feet): 20 Feet Assistive device: Rolling walker (2 wheeled) Gait Pattern/deviations: Decreased step length - right;Decreased step length - left;Decreased stride length Gait velocity: decreased   General Gait Details: able to make 10 steps forward and backwards 3 times with a slight improvment in time and quality of movement and no loss of balance, limited by fatigue   Stairs             Wheelchair Mobility    Modified Rankin (Stroke Patients Only)       Balance Overall balance assessment: Needs assistance Sitting-balance support: Feet supported;No upper extremity supported Sitting balance-Leahy Scale: Fair Sitting balance - Comments: fair/good seated at EOB   Standing balance support: During  functional activity;Bilateral upper extremity supported Standing balance-Leahy Scale: Fair Standing balance comment: fair/poor using RW                            Cognition Arousal/Alertness: Awake/alert Behavior During Therapy: WFL for tasks  assessed/performed Overall Cognitive Status: Within Functional Limits for tasks assessed                                        Exercises General Exercises - Lower Extremity Ankle Circles/Pumps: AROM;Strengthening;Both;Seated;20 reps Long Arc Quad: AROM;Strengthening;Seated;Both;20 reps Hip Flexion/Marching: AROM;Seated;Strengthening;Both;20 reps Toe Raises: AROM;Seated;Strengthening;Both;20 reps Heel Raises: AROM;Seated;Strengthening;Both;20 reps    General Comments        Pertinent Vitals/Pain Pain Assessment: No/denies pain    Home Living                      Prior Function            PT Goals (current goals can now be found in the care plan section) Acute Rehab PT Goals Patient Stated Goal: return home after rehab PT Goal Formulation: With patient/family Time For Goal Achievement: 01/16/21 Potential to Achieve Goals: Good Progress towards PT goals: Progressing toward goals    Frequency    Min 3X/week      PT Plan Current plan remains appropriate    Co-evaluation              AM-PAC PT "6 Clicks" Mobility   Outcome Measure  Help needed turning from your back to your side while in a flat bed without using bedrails?: A Little Help needed moving from lying on your back to sitting on the side of a flat bed without using bedrails?: A Little Help needed moving to and from a bed to a chair (including a wheelchair)?: A Lot Help needed standing up from a chair using your arms (e.g., wheelchair or bedside chair)?: A Lot Help needed to walk in hospital room?: A Lot Help needed climbing 3-5 steps with a railing? : Total 6 Click Score: 13    End of Session   Activity Tolerance: Patient tolerated treatment well;Patient limited by fatigue Patient left: in chair;with call bell/phone within reach;with chair alarm set;with family/visitor present Nurse Communication: Mobility status PT Visit Diagnosis: Unsteadiness on feet (R26.81);Other  abnormalities of gait and mobility (R26.89);Muscle weakness (generalized) (M62.81)     Time: 2637-8588 PT Time Calculation (min) (ACUTE ONLY): 21 min  Charges:  $Therapeutic Exercise: 8-22 mins $Therapeutic Activity: 8-22 mins                    12:27 PM, 01/11/21 Jeneen Rinks Cousler SPT  12:27 PM, 01/11/21 Lonell Grandchild, MPT Physical Therapist with [facility] [phone] office [phone] mobile phone

## 2021-01-11 NOTE — Progress Notes (Signed)
   Progress Note  Patient Name: [name]. Date of Encounter: 01/11/2021  Primary Cardiologist: Rozann Lesches, MD  I reviewed the cardiology consultation note from yesterday.  Main concern regarding rhythm is recent episodes of SVT and questionable bursts of atrial fibrillation although not definitive.  He has also had NSVT.  This is all noted in the setting of COVID-19 approximately 1 month ago, uncontrolled type 2 diabetes mellitus, metabolic encephalopathy, and AKI on CKD stage IIIb.  He is presently afebrile, heart rate is in the 90s in sinus rhythm with frequent PACs by telemetry.  Also 18 beat burst of NSVT noted.  Systolic blood pressure 233I.  Pertinent lab work includes potassium 3.3, BUN 84, creatinine 4.31 which is rising, hemoglobin 9.7, platelets 170.  I personally reviewed his ECG from May 21 showing sinus rhythm with prolonged PR interval and right bundle branch block.  Recent echocardiogram showed LVEF 60 to 65% without regional wall motion abnormalities, mild diastolic dysfunction, normal RV contraction and estimated PASP, mildly dilated aortic root at 39 mm.  At this point would continue to recommend observation as comorbid issues stabilize, no clear indication for long-term anticoagulation as yet, particularly without further monitoring.  Increase Cardizem CD to 180 mg daily, continue Lopressor 50 mg twice daily.  He is also on aspirin and Pravachol with previously documented coronary atherosclerosis by CT imaging.  Signed, Rozann Lesches, MD  01/11/2021, 9:11 AM

## 2021-01-11 NOTE — Progress Notes (Addendum)
PROGRESS NOTE   [name]  ZOX:[id number] DOB: 1941/01/18 DOA: 12/23/2020 PCP: [name], MD   Chief Complaint  Patient presents with   Weakness   Level of care: Telemetry  Brief Admission History:  80 year old gentleman with type 2 diabetes mellitus, stage IIIb CKD, dyslipidemia, hypertension, diastolic congestive heart failure and coronary artery disease presented with increasing weakness and confusion.  He was recently diagnosed with COVID-19 on 12/09/2020.  He was admitted with acute kidney injury.  Assessment & Plan:   Active Problems:   AKI (acute kidney injury) ([place])   Acute renal failure superimposed on stage 3b chronic kidney disease (HCC)   Acute metabolic encephalopathy   COVID-19 virus infection   Metabolic acidosis   SVT (supraventricular tachycardia) (HCC)   Pressure injury of skin   AKI on CKD stage IIIb- patient is still requiring dialysis at this point.  He has a left internal jugular HD catheter in place since 12/29/2020.  Nephrology is following closely.  He is due for hemodialysis later today.  It is uncertain if he is going to require a longer term course of hemodialysis.  Continue to follow and wait as the nephrology team will make the fi  Acute metabolic encephalopathy- this has been intermittent but today he seems much better and oriented.  Continue to monitor closely.  Hypocalcemia-she has been treated with Tums and vitamin D.  Status post calcium gluconate on 01/05/2021.  Anemia in CKD- he was transfused 2 units PRBC on 01/07/2021.  DKA-resolved  Uncontrolled type 2 diabetes mellitus with renal complications-continue SSI coverage and prandial coverage and frequent CBG monitoring.  Recent COVID-19 infection- patient status post 3 days of remdesivir for asymptomatic infection.  He has been stable on room air.  He is now on 30 days post diagnosis and no longer in quarantine.  Thrombocytopenia-resolved.  Hypokalemia- additional oral  replacement ordered, recheck in a.m.  SVT/A. fib with RVR- Cardizem and Lopressor as ordered by cardiology service.  Increased cardizem to 180 mg per cardio recs.  Coronary artery disease- no chest pain symptoms at this time.  New aspirin and beta-blocker therapy.  Essential hypertension- stable on current medications.  Hyperlipidemia-resume home statin therapy.  He is on pravastatin 10 mg daily.  Aortic root dilatation-this is being followed outpatient with cardiology.  DVT prophylaxis: SCDs Code Status: Full  Family Communication:  Disposition: SNF  Status is: Inpatient  Remains inpatient appropriate because:IV treatments appropriate due to intensity of illness or inability to take PO and Inpatient level of care appropriate due to severity of illness   Dispo: The patient is from: Home              Anticipated d/c is to: SNF              Patient currently is not medically stable to d/c.   Difficult to place patient No    Consultants:  Cardiology Nephrology   Procedures:    Antimicrobials:     Subjective: Pt denies any specific complaints.    Objective: Vitals:   01/11/21 0500 01/11/21 1042 01/11/21 1057 01/11/21 1337  BP:  136/79 136/79 119/70  Pulse:  89 89 100  Resp:  17 17 20   Temp:  98.1 F (36.7 C) 98.1 F (36.7 C) 97.8 F (36.6 C)  TempSrc:   Oral Oral  SpO2:  97%  99%  Weight: 92.9 kg  92.9 kg   Height:   5' 8.5" (1.74 m)     Intake/Output  Summary (Last 24 hours) at 01/11/2021 1523 Last data filed at 01/11/2021 1338 Gross per 24 hour  Intake 1429 ml  Output 400 ml  Net 1029 ml   Filed Weights   01/10/21 0238 01/11/21 0500 01/11/21 1057  Weight: 92.8 kg 92.9 kg 92.9 kg    Examination:  General exam: Chronically ill-appearing male, appears calm and comfortable  Respiratory system: Clear to auscultation. Respiratory effort normal. Cardiovascular system: normal S1 & S2 heard. No JVD, murmurs, rubs, gallops or clicks. No pedal  edema. Gastrointestinal system: Abdomen is nondistended, soft and nontender. No organomegaly or masses felt. Normal bowel sounds heard. Central nervous system: Alert and oriented. No focal neurological deficits. Extremities: Symmetric 5 x 5 power. Skin: No rashes, lesions or ulcers Psychiatry: Judgement and insight appear poor. Mood & affect flat.   Data Reviewed: I have personally reviewed following labs and imaging studies  CBC: Recent Labs  Lab 01/06/21 0629 01/07/21 1008 01/08/21 0552 01/09/21 0706 01/10/21 0542  WBC 8.4 9.5 9.6 7.4 8.3  HGB 7.1* 6.5* 9.5* 9.6* 9.7*  HCT 23.6* 21.7* 30.3* 30.5* 32.3*  MCV 101.7* 99.1 97.7 96.8 99.7  PLT 136* 139* 145* 159 725    Basic Metabolic Panel: Recent Labs  Lab 01/07/21 0510 01/08/21 0552 01/09/21 0706 01/10/21 0542 01/11/21 0535  NA 140 142 139 141 139  K 3.9 4.0 3.6 3.2* 3.3*  CL 103 102 102 104 102  CO2 25 27 28 26 24   GLUCOSE 164* 192* 178* 174* 207*  BUN 92* 92* 63* 79* 84*  CREATININE 4.77* 4.69* 3.56* 4.12* 4.31*  CALCIUM 6.5* 6.6* 6.6* 6.7* 6.7*  PHOS 5.2* 4.8* 3.5 4.1 4.5    GFR: Estimated Creatinine Clearance: 15.5 mL/min (A) (by C-G formula based on SCr of 4.31 mg/dL (H)).  Liver Function Tests: Recent Labs  Lab 01/07/21 0510 01/08/21 0552 01/09/21 0706 01/10/21 0542 01/11/21 0535  ALBUMIN 2.3* 2.2* 2.1* 2.1* 2.0*    CBG: Recent Labs  Lab 01/09/21 1611 01/09/21 2006 01/10/21 0734 01/10/21 1141 01/10/21 2007  GLUCAP 174* 186* 166* 237* 250*    Recent Results (from the past 240 hour(s))  Culture, Urine     Status: Abnormal   Collection Time: 01/05/21  5:22 PM   Specimen: Urine, Clean Catch  Result Value Ref Range Status   Specimen Description   Final    URINE, CLEAN CATCH Performed at [facility], [address], [place], [place] [address]    Special Requests   Final    NONE Performed at [facility], [address] [place], [place] [address]    Culture >=100,000 COLONIES/mL  ESCHERICHIA COLI (A)  Final   Report Status 01/08/2021 FINAL  Final   Organism ID, Bacteria ESCHERICHIA COLI (A)  Final      Susceptibility   Escherichia coli - MIC*    AMPICILLIN >=32 RESISTANT Resistant     CEFAZOLIN <=4 SENSITIVE Sensitive     CEFEPIME <=0.12 SENSITIVE Sensitive     CEFTRIAXONE <=0.25 SENSITIVE Sensitive     CIPROFLOXACIN <=0.25 SENSITIVE Sensitive     GENTAMICIN >=16 RESISTANT Resistant     IMIPENEM <=0.25 SENSITIVE Sensitive     NITROFURANTOIN <=16 SENSITIVE Sensitive     TRIMETH/SULFA >=320 RESISTANT Resistant     AMPICILLIN/SULBACTAM >=32 RESISTANT Resistant     PIP/TAZO <=4 SENSITIVE Sensitive     * >=100,000 COLONIES/mL ESCHERICHIA COLI     Radiology Studies: No results found.  Scheduled Meds:  sodium chloride   Intravenous Once  alteplase  2 mg Intracatheter Once   alteplase  2 mg Intracatheter Once   aspirin EC  81 mg Oral Daily   calcium carbonate  800 mg of elemental calcium Oral TID WC   Chlorhexidine Gluconate Cloth  6 each Topical Daily   Chlorhexidine Gluconate Cloth  6 each Topical Q0600   cholecalciferol  2,000 Units Oral Daily   darbepoetin (ARANESP) injection - NON-DIALYSIS  100 mcg Subcutaneous Q Tue-1800   [START ON 01/12/2021] diltiazem  180 mg Oral Daily   insulin aspart  0-5 Units Subcutaneous QHS   insulin aspart  0-9 Units Subcutaneous TID WC   insulin aspart  3 Units Subcutaneous TID WC   lidocaine  1 patch Transdermal Daily   lipase/protease/amylase  12,000 Units Oral TID AC   mouth rinse  15 mL Mouth Rinse BID   metoprolol tartrate  50 mg Oral BID   pravastatin  10 mg Oral q1800   saccharomyces boulardii  250 mg Oral BID   sodium bicarbonate  1,300 mg Oral BID   Continuous Infusions:  sodium chloride     sodium chloride      LOS: 19 days   Time spent: 35 mins   Shahrukh Pasch, MD How to contact the [facility] Attending or Consulting provider [place] or covering provider during after hours [place], for this patient?  Check  the care team in [facility] and look for a) attending/consulting [facility] provider listed and b) the [facility] team listed Log into [email or web address] and use [place]'s universal password to access. If you do not have the password, please contact the hospital operator. Locate the [facility] provider you are looking for under [facility] and page to a number that you can be directly reached. If you still have difficulty reaching the provider, please page the [facility] (Director on Call) for the Hospitalists listed on amion for assistance.  01/11/2021, 3:23 PM

## 2021-01-12 LAB — GLUCOSE, CAPILLARY
Glucose-Capillary: 206 mg/dL — ABNORMAL HIGH (ref 70–99)
Glucose-Capillary: 195 mg/dL — ABNORMAL HIGH (ref 70–99)
Glucose-Capillary: 268 mg/dL — ABNORMAL HIGH (ref 70–99)
Glucose-Capillary: 189 mg/dL — ABNORMAL HIGH (ref 70–99)
Glucose-Capillary: 185 mg/dL — ABNORMAL HIGH (ref 70–99)
Glucose-Capillary: 279 mg/dL — ABNORMAL HIGH (ref 70–99)
Glucose-Capillary: 300 mg/dL — ABNORMAL HIGH (ref 70–99)
Glucose-Capillary: 222 mg/dL — ABNORMAL HIGH (ref 70–99)
Glucose-Capillary: 151 mg/dL — ABNORMAL HIGH (ref 70–99)

## 2021-01-12 LAB — RENAL FUNCTION PANEL
Albumin: 1.9 g/dL — ABNORMAL LOW (ref 3.5–5.0)
Anion gap: 11 (ref 5–15)
BUN: 89 mg/dL — ABNORMAL HIGH (ref 8–23)
CO2: 24 mmol/L (ref 22–32)
Calcium: 6.5 mg/dL — ABNORMAL LOW (ref 8.9–10.3)
Chloride: 104 mmol/L (ref 98–111)
Creatinine, Ser: 4.37 mg/dL — ABNORMAL HIGH (ref 0.61–1.24)
GFR, Estimated: 13 mL/min — ABNORMAL LOW (ref >60)
Glucose, Bld: 209 mg/dL — ABNORMAL HIGH (ref 70–99)
Phosphorus: 4.2 mg/dL (ref 2.5–4.6)
Potassium: 3.5 mmol/L (ref 3.5–5.1)
Sodium: 139 mmol/L (ref 135–145)

## 2021-01-12 MED ORDER — INSULIN GLARGINE 100 UNIT/ML ~~LOC~~ SOLN
10.0000 [IU] | Freq: Every day | SUBCUTANEOUS | Status: DC
  Administered 2021-01-12 – 2021-01-15 (×4): 10 [IU] via SUBCUTANEOUS
  Filled 2021-01-12 (×5): qty 0.1

## 2021-01-12 MED ORDER — INSULIN ASPART 100 UNIT/ML IJ SOLN
5.0000 [IU] | Freq: Three times a day (TID) | INTRAMUSCULAR | Status: DC
  Administered 2021-01-12 – 2021-01-15 (×9): 5 [IU] via SUBCUTANEOUS

## 2021-01-12 NOTE — Progress Notes (Addendum)
    Telemetry shows predominately NSR and 1st degree AV Block with episodes of sinus tachycardia with HR into the 110's. Brief SVT but no definitive atrial fibrillation. Dr. Domenic Polite titrated his Cardizem CD from 1108m to 1862myesterday but he has not yet received the higher dose. Scheduled to be given this morning. Would continue with Cardizem CD 18090maily and Lopressor 13m33mD for now. BP remains stable, at 119/70 - 136/79 within the past 24 hours. Keep K+ ~ 4.0 and Mg ~ 2.0.  Signed, BritErma Heritage-C 01/12/2021, 9:15 AM Pager: [phone]

## 2021-01-12 NOTE — Care Management Important Message (Signed)
Important Message  Patient Details  Name: Mark ALLBRITTON Sr. MRN: [id number] Date of Birth: Apr 09, 1941   Medicare Important Message Given:  Yes  [name], LPN agreed to deliver due to contact precautions   Tommy Medal 01/12/2021, 9:47 AM

## 2021-01-12 NOTE — Progress Notes (Signed)
PROGRESS NOTE   [name]  KVQ:[id number] DOB: 1941/05/11 DOA: 12/23/2020 PCP: [name], MD   Chief Complaint  Patient presents with   Weakness   Level of care: Telemetry  Brief Admission History:  80 year old gentleman with type 2 diabetes mellitus, stage IIIb CKD, dyslipidemia, hypertension, diastolic congestive heart failure and coronary artery disease presented with increasing weakness and confusion.  He was recently diagnosed with COVID-19 on 12/09/2020.  He was admitted with acute kidney injury.  Assessment & Plan:   Active Problems:   AKI (acute kidney injury) ([place])   Acute renal failure superimposed on stage 3b chronic kidney disease (HCC)   Acute metabolic encephalopathy   COVID-19 virus infection   Metabolic acidosis   SVT (supraventricular tachycardia) (HCC)   Pressure injury of skin   AKI on CKD stage IIIb- patient is still requiring dialysis at this point.  He has a left internal jugular HD catheter in place since 12/29/2020.  Nephrology is following closely.  He is due for hemodialysis later today.  It is uncertain if he is going to require a longer term course of hemodialysis.  Continue to follow and wait as the nephrology team will make the final call regarding need for longer course of hemodialysis.   Acute metabolic encephalopathy- this has been intermittent but today he seems much better and oriented.  Continue to monitor closely.  Hypocalcemia-she has been treated with Tums and vitamin D.  Status post calcium gluconate on 01/05/2021.  Anemia in CKD- he was transfused 2 units PRBC on 01/07/2021.  DKA-resolved  Uncontrolled type 2 diabetes mellitus with renal complications-continue SSI coverage and prandial coverage and frequent CBG monitoring. CBG (last 3)  Recent Labs    01/12/21 1213 01/12/21 1428 01/12/21 1604  GLUCAP 279* 300* 222*   Increase novolog to 5 units TID with meals and add lantus 10 units daily.   Recent COVID-19 infection-  patient status post 3 days of remdesivir for asymptomatic infection.  He has been stable on room air.  He is now on 30 days post diagnosis and no longer in quarantine.  Thrombocytopenia-resolved.  Hypokalemia- repleted.   SVT/A. fib with RVR- Cardizem and Lopressor as ordered by cardiology service.  Increased cardizem to 180 mg per cardio recs. Continue metoprolol 50 mg BID.   Coronary artery disease- no chest pain symptoms at this time.  Continue aspirin and beta-blocker therapy.  Essential hypertension- stable on current medications.  Hyperlipidemia-resume home statin therapy.  He is on pravastatin 10 mg daily.  Aortic root dilatation-this is being followed outpatient with cardiology.  DVT prophylaxis: SCDs Code Status: Full  Family Communication:  Disposition: SNF  Status is: Inpatient  Remains inpatient appropriate because:IV treatments appropriate due to intensity of illness or inability to take PO and Inpatient level of care appropriate due to severity of illness   Dispo: The patient is from: Home              Anticipated d/c is to: SNF              Patient currently is not medically stable to d/c.   Difficult to place patient No    Consultants:  Cardiology Nephrology   Procedures:    Antimicrobials:     Subjective: Pt denies any specific complaints.    Objective: Vitals:   01/11/21 2219 01/12/21 0500 01/12/21 0700 01/12/21 1400  BP: 134/63 120/70  (!) 155/118  Pulse: 64 80  100  Resp: 18 18  19  Temp: 98.8 F (37.1 C) 98.2 F (36.8 C)  97.8 F (36.6 C)  TempSrc: Oral Oral  Oral  SpO2: 94% 96%  100%  Weight:   94 kg   Height:        Intake/Output Summary (Last 24 hours) at 01/12/2021 1654 Last data filed at 01/12/2021 1226 Gross per 24 hour  Intake 350 ml  Output 1950 ml  Net -1600 ml   Filed Weights   01/11/21 0500 01/11/21 1057 01/12/21 0700  Weight: 92.9 kg 92.9 kg 94 kg    Examination:  General exam: Chronically ill-appearing male,  appears calm and comfortable  Respiratory system: Clear to auscultation. Respiratory effort normal. Cardiovascular system: normal S1 & S2 heard. No JVD, murmurs, rubs, gallops or clicks. No pedal edema. Gastrointestinal system: Abdomen is nondistended, soft and nontender. No organomegaly or masses felt. Normal bowel sounds heard. Central nervous system: Alert and oriented. No focal neurological deficits. Extremities: Symmetric 5 x 5 power. Skin: No rashes, lesions or ulcers Psychiatry: Judgement and insight appear poor. Mood & affect flat.   Data Reviewed: I have personally reviewed following labs and imaging studies  CBC: Recent Labs  Lab 01/06/21 0629 01/07/21 1008 01/08/21 0552 01/09/21 0706 01/10/21 0542  WBC 8.4 9.5 9.6 7.4 8.3  HGB 7.1* 6.5* 9.5* 9.6* 9.7*  HCT 23.6* 21.7* 30.3* 30.5* 32.3*  MCV 101.7* 99.1 97.7 96.8 99.7  PLT 136* 139* 145* 159 836    Basic Metabolic Panel: Recent Labs  Lab 01/08/21 0552 01/09/21 0706 01/10/21 0542 01/11/21 0535 01/12/21 0635  NA 142 139 141 139 139  K 4.0 3.6 3.2* 3.3* 3.5  CL 102 102 104 102 104  CO2 27 28 26 24 24   GLUCOSE 192* 178* 174* 207* 209*  BUN 92* 63* 79* 84* 89*  CREATININE 4.69* 3.56* 4.12* 4.31* 4.37*  CALCIUM 6.6* 6.6* 6.7* 6.7* 6.5*  PHOS 4.8* 3.5 4.1 4.5 4.2    GFR: Estimated Creatinine Clearance: 15.4 mL/min (A) (by C-G formula based on SCr of 4.37 mg/dL (H)).  Liver Function Tests: Recent Labs  Lab 01/08/21 0552 01/09/21 0706 01/10/21 0542 01/11/21 0535 01/12/21 0635  ALBUMIN 2.2* 2.1* 2.1* 2.0* 1.9*    CBG: Recent Labs  Lab 01/11/21 2216 01/12/21 0744 01/12/21 1213 01/12/21 1428 01/12/21 1604  GLUCAP 185* 185* 279* 300* 222*    Recent Results (from the past 240 hour(s))  Culture, Urine     Status: Abnormal   Collection Time: 01/05/21  5:22 PM   Specimen: Urine, Clean Catch  Result Value Ref Range Status   Specimen Description   Final    URINE, CLEAN CATCH Performed at [facility], [address], [place], [place] [address]    Special Requests   Final    NONE Performed at [facility], [address] [place], [place] [address]    Culture >=100,000 COLONIES/mL ESCHERICHIA COLI (A)  Final   Report Status 01/08/2021 FINAL  Final   Organism ID, Bacteria ESCHERICHIA COLI (A)  Final      Susceptibility   Escherichia coli - MIC*    AMPICILLIN >=32 RESISTANT Resistant     CEFAZOLIN <=4 SENSITIVE Sensitive     CEFEPIME <=0.12 SENSITIVE Sensitive     CEFTRIAXONE <=0.25 SENSITIVE Sensitive     CIPROFLOXACIN <=0.25 SENSITIVE Sensitive     GENTAMICIN >=16 RESISTANT Resistant     IMIPENEM <=0.25 SENSITIVE Sensitive     NITROFURANTOIN <=16 SENSITIVE Sensitive     TRIMETH/SULFA >=320 RESISTANT Resistant  AMPICILLIN/SULBACTAM >=32 RESISTANT Resistant     PIP/TAZO <=4 SENSITIVE Sensitive     * >=100,000 COLONIES/mL ESCHERICHIA COLI     Radiology Studies: No results found.  Scheduled Meds:  sodium chloride   Intravenous Once   alteplase  2 mg Intracatheter Once   alteplase  2 mg Intracatheter Once   aspirin EC  81 mg Oral Daily   calcium carbonate  800 mg of elemental calcium Oral TID WC   Chlorhexidine Gluconate Cloth  6 each Topical Daily   Chlorhexidine Gluconate Cloth  6 each Topical Q0600   cholecalciferol  2,000 Units Oral Daily   darbepoetin (ARANESP) injection - NON-DIALYSIS  100 mcg Subcutaneous Q Tue-1800   diltiazem  180 mg Oral Daily   insulin aspart  0-5 Units Subcutaneous QHS   insulin aspart  0-9 Units Subcutaneous TID WC   insulin aspart  3 Units Subcutaneous TID WC   lidocaine  1 patch Transdermal Daily   lipase/protease/amylase  12,000 Units Oral TID AC   mouth rinse  15 mL Mouth Rinse BID   metoprolol tartrate  50 mg Oral BID   pravastatin  10 mg Oral q1800   saccharomyces boulardii  250 mg Oral BID   sodium bicarbonate  1,300 mg Oral BID   Continuous Infusions:  sodium chloride     sodium chloride      LOS: 20 days   Time spent: 35  mins   Trimaine Maser Wynetta Emery, MD How to contact the [facility] Attending or Consulting provider [place] or covering provider during after hours [place], for this patient?  Check the care team in [facility] and look for a) attending/consulting TRH provider listed and b) the [facility] team listed Log into [email or web address] and use [place]'s universal password to access. If you do not have the password, please contact the hospital operator. Locate the [facility] provider you are looking for under [facility] and page to a number that you can be directly reached. If you still have difficulty reaching the provider, please page the [facility] (Director on Call) for the Hospitalists listed on amion for assistance.  01/12/2021, 4:54 PM

## 2021-01-12 NOTE — Progress Notes (Signed)
Patient ID: [name]., male   DOB: 23-Oct-1940, 80 y.o.   MRN: [id number] [facility] Progress Note   Assessment/ Plan:   1. Acute kidney Injury on chronic kidney disease stage IIIb (baseline creatinine 1.7-2.0): Suspected to have ATN associated with COVID-19 infection and volume depletion.  Likely in plateau phase of ATN.  Intermittent confusion has pushed [place] to do dialysis to rule out uremia last done on 6/6.  Mental status fluctuating intermittently.  The patient and I discussed his plan for dialysis and that we will continue to try and hold off in between dialysis sessions as long as possible to monitor for recovery.  If his BUN and creatinine continue to rise or patient has concerning uremic symptoms he may need to be discharged on dialysis.  His kidney function and BUN were stable today.  I think we should continue to hold on dialysis and try to avoid doing any over the weekend to see if he can remain independent from hemodialysis which would be a huge goal for him. 2.  Acute metabolic encephalopathy: Seems to be fluctuating day-to-day likely delirium on top of dementia.  Did not improve the day after dialysis so uremia likely not the only contributor.   3.  Hypertension: Blood pressure currently within acceptable range 4.  Anemia of acute/critical illness: Hgb stable at 9.7.  5.  Metabolic acidosis: Resolved at this time.  Subjective:   Patient feels well today with no complaints.  Continues to urinate well.   Objective:   BP 120/70 (BP Location: Left Arm)   Pulse 80   Temp 98.2 F (36.8 C) (Oral)   Resp 18   Ht 5' 8.5" (1.74 m)   Wt 94 kg   SpO2 96%   BMI 31.05 kg/m   Intake/Output Summary (Last 24 hours) at 01/12/2021 0932 Last data filed at 01/12/2021 0700 Gross per 24 hour  Intake 823 ml  Output 1700 ml  Net -877 ml   Weight change: 0 kg  Physical Exam: Gen: Appears comfortable resting in bed, no distress CVS: Normal rate, no obvious murmurs Resp:  Bilateral chest rise with no increased work of breathing Abd: Soft, obese, nontender, bowel sounds normal Ext: Warm and well perfused, trace lower extremity edema  Imaging: No results found.  Labs: BMET Recent Labs  Lab 01/06/21 0629 01/07/21 0510 01/08/21 0552 01/09/21 0706 01/10/21 0542 01/11/21 0535 01/12/21 0635  NA 142 140 142 139 141 139 139  K 3.6 3.9 4.0 3.6 3.2* 3.3* 3.5  CL 103 103 102 102 104 102 104  CO2 26 25 27 28 26 24 24   GLUCOSE 76 164* 192* 178* 174* 207* 209*  BUN 88* 92* 92* 63* 79* 84* 89*  CREATININE 4.87* 4.77* 4.69* 3.56* 4.12* 4.31* 4.37*  CALCIUM 6.7* 6.5* 6.6* 6.6* 6.7* 6.7* 6.5*  PHOS 4.9* 5.2* 4.8* 3.5 4.1 4.5 4.2   CBC Recent Labs  Lab 01/07/21 1008 01/08/21 0552 01/09/21 0706 01/10/21 0542  WBC 9.5 9.6 7.4 8.3  HGB 6.5* 9.5* 9.6* 9.7*  HCT 21.7* 30.3* 30.5* 32.3*  MCV 99.1 97.7 96.8 99.7  PLT 139* 145* 159 170    Medications:     sodium chloride   Intravenous Once   alteplase  2 mg Intracatheter Once   alteplase  2 mg Intracatheter Once   aspirin EC  81 mg Oral Daily   calcium carbonate  800 mg of elemental calcium Oral TID WC   Chlorhexidine Gluconate Cloth  6 each  Topical Daily   Chlorhexidine Gluconate Cloth  6 each Topical Q0600   cholecalciferol  2,000 Units Oral Daily   darbepoetin (ARANESP) injection - NON-DIALYSIS  100 mcg Subcutaneous Q Tue-1800   diltiazem  180 mg Oral Daily   insulin aspart  0-5 Units Subcutaneous QHS   insulin aspart  0-9 Units Subcutaneous TID WC   insulin aspart  3 Units Subcutaneous TID WC   lidocaine  1 patch Transdermal Daily   lipase/protease/amylase  12,000 Units Oral TID AC   mouth rinse  15 mL Mouth Rinse BID   metoprolol tartrate  50 mg Oral BID   pravastatin  10 mg Oral q1800   saccharomyces boulardii  250 mg Oral BID   sodium bicarbonate  1,300 mg Oral BID   Reesa Chew  01/12/2021, 9:32 AM

## 2021-01-13 LAB — RENAL FUNCTION PANEL
Albumin: 2 g/dL — ABNORMAL LOW (ref 3.5–5.0)
Anion gap: 14 (ref 5–15)
BUN: 97 mg/dL — ABNORMAL HIGH (ref 8–23)
CO2: 23 mmol/L (ref 22–32)
Calcium: 6.7 mg/dL — ABNORMAL LOW (ref 8.9–10.3)
Chloride: 103 mmol/L (ref 98–111)
Creatinine, Ser: 4.75 mg/dL — ABNORMAL HIGH (ref 0.61–1.24)
GFR, Estimated: 12 mL/min — ABNORMAL LOW (ref >60)
Glucose, Bld: 172 mg/dL — ABNORMAL HIGH (ref 70–99)
Phosphorus: 4.7 mg/dL — ABNORMAL HIGH (ref 2.5–4.6)
Potassium: 3.9 mmol/L (ref 3.5–5.1)
Sodium: 140 mmol/L (ref 135–145)

## 2021-01-13 LAB — GLUCOSE, CAPILLARY
Glucose-Capillary: 167 mg/dL — ABNORMAL HIGH (ref 70–99)
Glucose-Capillary: 371 mg/dL — ABNORMAL HIGH (ref 70–99)
Glucose-Capillary: 204 mg/dL — ABNORMAL HIGH (ref 70–99)
Glucose-Capillary: 149 mg/dL — ABNORMAL HIGH (ref 70–99)

## 2021-01-13 LAB — CBC
HCT: 30.9 % — ABNORMAL LOW (ref 39.0–52.0)
Hemoglobin: 9.4 g/dL — ABNORMAL LOW (ref 13.0–17.0)
MCH: 30 pg (ref 26.0–34.0)
MCHC: 30.4 g/dL (ref 30.0–36.0)
MCV: 98.7 fL (ref 80.0–100.0)
Platelets: 199 10*3/uL (ref 150–400)
RBC: 3.13 MIL/uL — ABNORMAL LOW (ref 4.22–5.81)
RDW: 14.6 % (ref 11.5–15.5)
WBC: 7.3 10*3/uL (ref 4.0–10.5)
nRBC: 0 % (ref 0.0–0.2)

## 2021-01-13 LAB — MAGNESIUM: Magnesium: 1.3 mg/dL — ABNORMAL LOW (ref 1.7–2.4)

## 2021-01-13 MED ORDER — MAGNESIUM SULFATE 4 GM/100ML IV SOLN
4.0000 g | Freq: Once | INTRAVENOUS | Status: AC
  Administered 2021-01-13: 4 g via INTRAVENOUS
  Filled 2021-01-13: qty 100

## 2021-01-13 NOTE — Progress Notes (Signed)
PROGRESS NOTE   [name]  NGE:[id number] DOB: 18-Mar-1941 DOA: 12/23/2020 PCP: [name], MD   Chief Complaint  Patient presents with   Weakness   Level of care: Med-Surg  Brief Admission History:  80 year old gentleman with type 2 diabetes mellitus, stage IIIb CKD, dyslipidemia, hypertension, diastolic congestive heart failure and coronary artery disease presented with increasing weakness and confusion.  He was recently diagnosed with COVID-19 on 12/09/2020.  He was admitted with acute kidney injury.  Assessment & Plan:   Active Problems:   AKI (acute kidney injury) ([place])   Acute renal failure superimposed on stage 3b chronic kidney disease (HCC)   Acute metabolic encephalopathy   COVID-19 virus infection   Metabolic acidosis   SVT (supraventricular tachycardia) (HCC)   Pressure injury of skin   AKI on CKD stage IIIb- patient is still requiring dialysis at this point.  He has a left internal jugular HD catheter in place since 12/29/2020.  Nephrology is following closely.    It is uncertain if he is going to require a longer term course of hemodialysis.  Continue to follow and wait as the nephrology team will make the final call regarding need for longer course of hemodialysis.  He is being followed over the weekend to see if his renal function remains stable.  Plan is for SNF placement when medically cleared.    Acute metabolic encephalopathy- RESOLVED.  This has been intermittent but today he seems much better and oriented.  Continue to monitor closely.  Hypocalcemia-she has been treated with Tums and vitamin D.  Status post calcium gluconate on 01/05/2021.  Anemia in CKD- he was transfused 2 units PRBC on 01/07/2021.  DKA-resolved  Uncontrolled type 2 diabetes mellitus with renal complications-continue SSI coverage and prandial coverage and frequent CBG monitoring. CBG (last 3)  Recent Labs    01/12/21 2114 01/13/21 0745 01/13/21 1159  GLUCAP 151* 167* 371*    Increase novolog to 5 units TID with meals and add lantus 10 units daily.   Recent COVID-19 infection- patient status post 3 days of remdesivir for asymptomatic infection.  He has been stable on room air.  He is now on 30 days post diagnosis and no longer in quarantine.  Thrombocytopenia-resolved.  Hypokalemia- repleted.   SVT/A. fib with RVR- Cardizem and Lopressor as ordered by cardiology service.  Increased cardizem to 180 mg per cardio recs. Continue metoprolol 50 mg BID.   Coronary artery disease- no chest pain symptoms at this time.  Continue aspirin and beta-blocker therapy.  Severe  hypomagnesemia - IV replacement ordered, recheck in AM.  Resting tremor - Possibly related to severe low Mg levels, repleting as above.  Follow.   Essential hypertension- stable on current medications.  Hyperlipidemia-resume home statin therapy.  He is on pravastatin 10 mg daily.  Aortic root dilatation-this is being followed outpatient with cardiology.  DVT prophylaxis: SCDs Code Status: Full  Family Communication:  Disposition: SNF  Status is: Inpatient  Remains inpatient appropriate because:IV treatments appropriate due to intensity of illness or inability to take PO and Inpatient level of care appropriate due to severity of illness   Dispo: The patient is from: Home              Anticipated d/c is to: SNF              Patient currently is not medically stable to d/c.   Difficult to place patient No    Consultants:  Cardiology Nephrology  Procedures:    Antimicrobials:     Subjective: Pt reports that he has been urinating with no difficulty.  He has had a slight resting tremor.    Objective: Vitals:   01/12/21 1400 01/12/21 2129 01/13/21 0900 01/13/21 1431  BP: (!) 155/118 140/81 (!) 131/51 (!) 105/51  Pulse: 100 87 94 77  Resp: 19 20 18 18   Temp: 97.8 F (36.6 C) (!) 100.7 F (38.2 C) 98.9 F (37.2 C) 98 F (36.7 C)  TempSrc: Oral Oral Oral Oral  SpO2: 100%  96% 97% 95%  Weight:      Height:        Intake/Output Summary (Last 24 hours) at 01/13/2021 1458 Last data filed at 01/13/2021 0900 Gross per 24 hour  Intake 480 ml  Output 1000 ml  Net -520 ml   Filed Weights   01/11/21 0500 01/11/21 1057 01/12/21 0700  Weight: 92.9 kg 92.9 kg 94 kg    Examination:  General exam: Chronically ill-appearing male, appears calm and comfortable  Respiratory system: Clear to auscultation. Respiratory effort normal. Cardiovascular system: normal S1 & S2 heard. No JVD, murmurs, rubs, gallops or clicks. No pedal edema. Gastrointestinal system: Abdomen is nondistended, soft and nontender. No organomegaly or masses felt. Normal bowel sounds heard. Central nervous system: Alert and oriented. No focal neurological deficits. Extremities: Symmetric 5 x 5 power. Skin: No rashes, lesions or ulcers Psychiatry: Judgement and insight appear poor. Mood & affect flat.   Data Reviewed: I have personally reviewed following labs and imaging studies  CBC: Recent Labs  Lab 01/07/21 1008 01/08/21 0552 01/09/21 0706 01/10/21 0542 01/13/21 0622  WBC 9.5 9.6 7.4 8.3 7.3  HGB 6.5* 9.5* 9.6* 9.7* 9.4*  HCT 21.7* 30.3* 30.5* 32.3* 30.9*  MCV 99.1 97.7 96.8 99.7 98.7  PLT 139* 145* 159 170 818    Basic Metabolic Panel: Recent Labs  Lab 01/09/21 0706 01/10/21 0542 01/11/21 0535 01/12/21 0635 01/13/21 0622  NA 139 141 139 139 140  K 3.6 3.2* 3.3* 3.5 3.9  CL 102 104 102 104 103  CO2 28 26 24 24 23   GLUCOSE 178* 174* 207* 209* 172*  BUN 63* 79* 84* 89* 97*  CREATININE 3.56* 4.12* 4.31* 4.37* 4.75*  CALCIUM 6.6* 6.7* 6.7* 6.5* 6.7*  MG  --   --   --   --  1.3*  PHOS 3.5 4.1 4.5 4.2 4.7*    GFR: Estimated Creatinine Clearance: 14.2 mL/min (A) (by C-G formula based on SCr of 4.75 mg/dL (H)).  Liver Function Tests: Recent Labs  Lab 01/09/21 0706 01/10/21 0542 01/11/21 0535 01/12/21 0635 01/13/21 0622  ALBUMIN 2.1* 2.1* 2.0* 1.9* 2.0*     CBG: Recent Labs  Lab 01/12/21 1428 01/12/21 1604 01/12/21 2114 01/13/21 0745 01/13/21 1159  GLUCAP 300* 222* 151* 167* 371*    Recent Results (from the past 240 hour(s))  Culture, Urine     Status: Abnormal   Collection Time: 01/05/21  5:22 PM   Specimen: Urine, Clean Catch  Result Value Ref Range Status   Specimen Description   Final    URINE, CLEAN CATCH Performed at [facility], [address] [place], [place] [address]    Special Requests   Final    NONE Performed at [facility], [address] [place], [place] [address]    Culture >=100,000 COLONIES/mL ESCHERICHIA COLI (A)  Final   Report Status 01/08/2021 FINAL  Final   Organism ID, Bacteria ESCHERICHIA COLI (A)  Final  Susceptibility   Escherichia coli - MIC*    AMPICILLIN >=32 RESISTANT Resistant     CEFAZOLIN <=4 SENSITIVE Sensitive     CEFEPIME <=0.12 SENSITIVE Sensitive     CEFTRIAXONE <=0.25 SENSITIVE Sensitive     CIPROFLOXACIN <=0.25 SENSITIVE Sensitive     GENTAMICIN >=16 RESISTANT Resistant     IMIPENEM <=0.25 SENSITIVE Sensitive     NITROFURANTOIN <=16 SENSITIVE Sensitive     TRIMETH/SULFA >=320 RESISTANT Resistant     AMPICILLIN/SULBACTAM >=32 RESISTANT Resistant     PIP/TAZO <=4 SENSITIVE Sensitive     * >=100,000 COLONIES/mL ESCHERICHIA COLI     Radiology Studies: No results found.  Scheduled Meds:  sodium chloride   Intravenous Once   alteplase  2 mg Intracatheter Once   alteplase  2 mg Intracatheter Once   aspirin EC  81 mg Oral Daily   calcium carbonate  800 mg of elemental calcium Oral TID WC   Chlorhexidine Gluconate Cloth  6 each Topical Daily   Chlorhexidine Gluconate Cloth  6 each Topical Q0600   cholecalciferol  2,000 Units Oral Daily   darbepoetin (ARANESP) injection - NON-DIALYSIS  100 mcg Subcutaneous Q Tue-1800   diltiazem  180 mg Oral Daily   insulin aspart  0-5 Units Subcutaneous QHS   insulin aspart  0-9 Units Subcutaneous TID WC   insulin aspart  5 Units  Subcutaneous TID WC   insulin glargine  10 Units Subcutaneous QHS   lidocaine  1 patch Transdermal Daily   lipase/protease/amylase  12,000 Units Oral TID AC   mouth rinse  15 mL Mouth Rinse BID   metoprolol tartrate  50 mg Oral BID   pravastatin  10 mg Oral q1800   saccharomyces boulardii  250 mg Oral BID   sodium bicarbonate  1,300 mg Oral BID   Continuous Infusions:  sodium chloride     sodium chloride      LOS: 21 days   Time spent: 35 mins   Jettie Mannor Wynetta Emery, MD How to contact the [facility] Attending or Consulting provider [place] or covering provider during after hours [place], for this patient?  Check the care team in [facility] and look for a) attending/consulting TRH provider listed and b) the [facility] team listed Log into [email or web address] and use 's universal password to access. If you do not have the password, please contact the hospital operator. Locate the [facility] provider you are looking for under [facility] and page to a number that you can be directly reached. If you still have difficulty reaching the provider, please page the [facility] (Director on Call) for the Hospitalists listed on amion for assistance.  01/13/2021, 2:58 PM

## 2021-01-13 NOTE — Progress Notes (Signed)
Pt back to bed with assist by NT and FWW. Pt incontinent of soft, orange/brown colored BM. Pt cleaned, new foam pad applied to sacral area. Pt's sacral. Buttocks and perineal area skin looks much better than on my previous shifts, no current open wounds noted. Previous open area healed, pink. Pt states he can tell wound is better because it doesn't sting and hurt like it did last week.

## 2021-01-13 NOTE — Plan of Care (Signed)
  Problem: Safety: Goal: Ability to remain free from injury will improve Outcome: Progressing   Problem: Skin Integrity: Goal: Risk for impaired skin integrity will decrease Outcome: Progressing   Problem: Education: Goal: Knowledge of disease and its progression will improve Outcome: Progressing Goal: Individualized Educational Video(s) Outcome: Progressing   Problem: Fluid Volume: Goal: Compliance with measures to maintain balanced fluid volume will improve Outcome: Progressing   Problem: Health Behavior/Discharge Planning: Goal: Ability to manage health-related needs will improve Outcome: Progressing   Problem: Nutritional: Goal: Ability to make healthy dietary choices will improve Outcome: Progressing   Problem: Clinical Measurements: Goal: Complications related to the disease process, condition or treatment will be avoided or minimized Outcome: Progressing

## 2021-01-13 NOTE — Progress Notes (Signed)
Physical Therapy Treatment Patient Details Name: Mark YASUI Sr. MRN: [id number] DOB: 09-Apr-1941 Today's Date: 01/13/2021    History of Present Illness Mark L Facundo Sr. is a 80 y.o. male with medical history significant for type 2 diabetes, CKD stage IIIb, hypertension, dyslipidemia, diastolic CHF, and CAD described to the ED via EMS with increasing weakness and confusion along with poor appetite for the last 2 to 3 days.  Symptoms have been present over the past week and patient was diagnosed with COVID-19 infection on May 7.  He saw his PCP on 5/19 for confusion and was not noted to be hypoxemic at that time.  He was told to hold his Lasix and encouraged to drink plenty of fluids, but he really has not been able to drink very much fluids according to the wife at the bedside.  He was started on some mild delirium related to his COVID infection.  Unfortunately, his symptoms have progressed significantly to days.  Patient cannot give any further history at this time given his severe confusion.  Primary historian his wife at bedside.    PT Comments    Patient demonstrates slow labored movement with fair carryover for sitting up from side lying position using bed rail due to weakness, improvement in standing balance while completing side steps at bedside and tolerated up to 6 standing partial squats using RW before having to sit due to fatigue.  Patient tolerated sitting up in chair after therapy with his spouse present in room.  Patient will benefit from continued physical therapy in hospital and recommended venue below to increase strength, balance, endurance for safe ADLs and gait.    Follow Up Recommendations  SNF;Supervision - Intermittent;Supervision for mobility/OOB     Equipment Recommendations  None recommended by PT    Recommendations for Other Services       Precautions / Restrictions Precautions Precautions: Fall Restrictions Weight Bearing Restrictions: No     Mobility  Bed Mobility Overal bed mobility: Needs Assistance Bed Mobility: Rolling;Sidelying to Sit Rolling: Min assist Sidelying to sit: Mod assist       General bed mobility comments: has diffiuclty for completing side lying to sitting due to weakness and fair/poor carryover for pushing up using bed rail    Transfers Overall transfer level: Needs assistance Equipment used: Rolling walker (2 wheeled)   Sit to Stand: Independent;Modified independent (Device/Increase time);Min assist;Mod assist Stand pivot transfers: Min assist       General transfer comment: increased time, labored movement, tends to drop into chair during stand to sitting  Ambulation/Gait Ambulation/Gait assistance: Min assist Gait Distance (Feet): 15 Feet Assistive device: Rolling walker (2 wheeled) Gait Pattern/deviations: Decreased step length - right;Decreased step length - left;Decreased stride length Gait velocity: decreased   General Gait Details: tolerated up to 15 side steps at bedside with slow labored movement, no loss of balance, limited mostly due to c/o fatigue   Stairs             Wheelchair Mobility    Modified Rankin (Stroke Patients Only)       Balance Overall balance assessment: Needs assistance Sitting-balance support: Feet supported;No upper extremity supported Sitting balance-Leahy Scale: Fair Sitting balance - Comments: fair/good seated at EOB   Standing balance support: During functional activity;Bilateral upper extremity supported Standing balance-Leahy Scale: Fair Standing balance comment: fair/poor using RW  Cognition Arousal/Alertness: Awake/alert Behavior During Therapy: WFL for tasks assessed/performed Overall Cognitive Status: Within Functional Limits for tasks assessed                                        Exercises General Exercises - Lower Extremity Long Arc Quad:  AROM;Strengthening;Seated;Both;10 reps Hip Flexion/Marching: AROM;Seated;Strengthening;Both;10 reps Toe Raises: AROM;Seated;Strengthening;Both;20 reps Heel Raises: AROM;Seated;Strengthening;Both;20 reps    General Comments        Pertinent Vitals/Pain Pain Assessment: No/denies pain    Home Living                      Prior Function            PT Goals (current goals can now be found in the care plan section) Acute Rehab PT Goals Patient Stated Goal: return home after rehab PT Goal Formulation: With patient/family Time For Goal Achievement: 01/16/21 Potential to Achieve Goals: Good Progress towards PT goals: Progressing toward goals    Frequency    Min 3X/week      PT Plan Current plan remains appropriate    Co-evaluation              AM-PAC PT "6 Clicks" Mobility   Outcome Measure  Help needed turning from your back to your side while in a flat bed without using bedrails?: A Little Help needed moving from lying on your back to sitting on the side of a flat bed without using bedrails?: A Lot Help needed moving to and from a bed to a chair (including a wheelchair)?: A Lot Help needed standing up from a chair using your arms (e.g., wheelchair or bedside chair)?: A Lot Help needed to walk in hospital room?: A Lot Help needed climbing 3-5 steps with a railing? : Total 6 Click Score: 12    End of Session   Activity Tolerance: Patient tolerated treatment well;Patient limited by fatigue Patient left: in chair;with call bell/phone within reach;with family/visitor present Nurse Communication: Mobility status PT Visit Diagnosis: Unsteadiness on feet (R26.81);Other abnormalities of gait and mobility (R26.89);Muscle weakness (generalized) (M62.81)     Time: 0867-6195 PT Time Calculation (min) (ACUTE ONLY): 21 min  Charges:  $Therapeutic Exercise: 8-22 mins $Therapeutic Activity: 8-22 mins                     12:44 PM, 01/13/21 Lonell Grandchild,  MPT Physical Therapist with [facility] [phone] office [phone] mobile phone

## 2021-01-13 NOTE — Progress Notes (Signed)
Pt assisted OOB to recliner by PT. Pt without c/o at this time, tolerated PT exercises with only mild SOB with exertion, recovered quickly. Still c/o weak with OOB, standing and walking. Wife at bedside, updated on plan of care by MD [facility].

## 2021-01-14 DIAGNOSIS — B029 Zoster without complications: Secondary | ICD-10-CM | POA: Diagnosis present

## 2021-01-14 LAB — RENAL FUNCTION PANEL
Albumin: 2 g/dL — ABNORMAL LOW (ref 3.5–5.0)
Anion gap: 12 (ref 5–15)
BUN: 105 mg/dL — ABNORMAL HIGH (ref 8–23)
CO2: 24 mmol/L (ref 22–32)
Calcium: 6.9 mg/dL — ABNORMAL LOW (ref 8.9–10.3)
Chloride: 102 mmol/L (ref 98–111)
Creatinine, Ser: 4.67 mg/dL — ABNORMAL HIGH (ref 0.61–1.24)
GFR, Estimated: 12 mL/min — ABNORMAL LOW (ref >60)
Glucose, Bld: 151 mg/dL — ABNORMAL HIGH (ref 70–99)
Phosphorus: 4.7 mg/dL — ABNORMAL HIGH (ref 2.5–4.6)
Potassium: 3.8 mmol/L (ref 3.5–5.1)
Sodium: 138 mmol/L (ref 135–145)

## 2021-01-14 LAB — GLUCOSE, CAPILLARY
Glucose-Capillary: 138 mg/dL — ABNORMAL HIGH (ref 70–99)
Glucose-Capillary: 191 mg/dL — ABNORMAL HIGH (ref 70–99)

## 2021-01-14 LAB — MAGNESIUM: Magnesium: 2 mg/dL (ref 1.7–2.4)

## 2021-01-14 MED ORDER — VALACYCLOVIR HCL 500 MG PO TABS
500.0000 mg | ORAL_TABLET | Freq: Every day | ORAL | Status: DC
  Administered 2021-01-14 – 2021-01-19 (×6): 500 mg via ORAL
  Filled 2021-01-14 (×6): qty 1

## 2021-01-14 MED ORDER — VALACYCLOVIR HCL 500 MG PO TABS: 1000.0000 mg | ORAL_TABLET | Freq: Every day | ORAL | Status: DC

## 2021-01-14 NOTE — Progress Notes (Signed)
PROGRESS NOTE   [name]  JGO:[id number] DOB: 29-Dec-1940 DOA: 12/23/2020 PCP: [name], MD   Chief Complaint  Patient presents with   Weakness   Level of care: Med-Surg  Brief Admission History:  80 year old gentleman with type 2 diabetes mellitus, stage IIIb CKD, dyslipidemia, hypertension, diastolic congestive heart failure and coronary artery disease presented with increasing weakness and confusion.  He was recently diagnosed with COVID-19 on 12/09/2020.  He was admitted with acute kidney injury.  Assessment & Plan:   Active Problems:   AKI (acute kidney injury) ([place])   Acute renal failure superimposed on stage 3b chronic kidney disease (HCC)   Acute metabolic encephalopathy   COVID-19 virus infection   Metabolic acidosis   SVT (supraventricular tachycardia) (HCC)   Pressure injury of skin   Shingles   AKI on CKD stage IIIb- patient is still requiring dialysis at this point.  He has a left internal jugular HD catheter in place since 12/29/2020.  Nephrology is following closely.  Pt having good urine output and creatinine holding stable however BUN is rising.  Plan is for SNF placement when medically cleared.    Acute shingles outbreak - involving the left foot and leg in a dermatomal pattern. Discussed with pharmD and starting oral valtrex renally dosed.  Start airborne and contact precautions.  Can dc airborne when lesions have crusted and dried up.    Acute metabolic encephalopathy- RESOLVED.  This has been intermittent but today he seems much better and oriented.  Continue to monitor closely.  Hypocalcemia-being treated with Tums and vitamin D.  Status post calcium gluconate on 01/05/2021.  Anemia in CKD- he was transfused 2 units PRBC on 01/07/2021.  DKA-resolved  Uncontrolled type 2 diabetes mellitus with renal complications-continue SSI coverage and prandial coverage and frequent CBG monitoring. CBG (last 3)  Recent Labs    01/13/21 1656  01/13/21 2112 01/14/21 0305  GLUCAP 204* 149* 138*   Continue novolog to 5 units TID with meals and lantus 10 units daily.   Recent COVID-19 infection- patient status post 3 days of remdesivir for asymptomatic infection.  He has been stable on room air.  He is now on 30 days post diagnosis and no longer in quarantine.  Thrombocytopenia-resolved.  Hypokalemia- repleted.   SVT/A. fib with RVR- Cardizem and Lopressor as ordered by cardiology service.  Increased cardizem to 180 mg per cardio recs. Continue metoprolol 50 mg BID.   Coronary artery disease- no chest pain symptoms at this time.  Continue aspirin and beta-blocker therapy.  Severe  hypomagnesemia - this has been repleted.   Resting tremor - Possibly related to severe low Mg levels, seems to be improving, follow closely.     Essential hypertension- stable on current medications.  Hyperlipidemia-resume home statin therapy.  He is on pravastatin 10 mg daily.  Aortic root dilatation-this is being followed outpatient with cardiology.  DVT prophylaxis: SCDs Code Status: Full  Family Communication: wife updated by telephone 6/11 Disposition: SNF  Status is: Inpatient  Remains inpatient appropriate because:IV treatments appropriate due to intensity of illness or inability to take PO and Inpatient level of care appropriate due to severity of illness   Dispo: The patient is from: Home              Anticipated d/c is to: SNF              Patient currently is not medically stable to d/c.   Difficult to place patient No  Consultants:  Cardiology Nephrology   Procedures:    Antimicrobials:   Valtrex 01/14/21>>  Subjective: Pt reports painful left foot with blisters seen.     Objective: Vitals:   01/13/21 1431 01/13/21 2057 01/14/21 0537 01/14/21 0544  BP: (!) 105/51 138/72 134/74   Pulse: 77 86 84   Resp: 18 20 18    Temp: 98 F (36.7 C) 98.3 F (36.8 C) 98.3 F (36.8 C)   TempSrc: Oral Oral Oral   SpO2: 95%  98% 97%   Weight:    91.5 kg  Height:        Intake/Output Summary (Last 24 hours) at 01/14/2021 1050 Last data filed at 01/14/2021 0839 Gross per 24 hour  Intake 580 ml  Output 1450 ml  Net -870 ml   Filed Weights   01/11/21 1057 01/12/21 0700 01/14/21 0544  Weight: 92.9 kg 94 kg 91.5 kg   Examination:  General exam: Chronically ill-appearing male, appears calm and comfortable  Respiratory system: Clear to auscultation. Respiratory effort normal. Cardiovascular system: normal S1 & S2 heard. No JVD, murmurs, rubs, gallops or clicks. No pedal edema. Gastrointestinal system: Abdomen is nondistended, soft and nontender. No organomegaly or masses felt. Normal bowel sounds heard. Central nervous system: Alert and oriented. No focal neurological deficits. Extremities: Symmetric 5 x 5 power. Skin: herpetic blisters seen left lower leg and foot c/w shingles outbreak.  Psychiatry: Judgement and insight appear poor. Mood & affect flat.   Data Reviewed: I have personally reviewed following labs and imaging studies  CBC: Recent Labs  Lab 01/08/21 0552 01/09/21 0706 01/10/21 0542 01/13/21 0622  WBC 9.6 7.4 8.3 7.3  HGB 9.5* 9.6* 9.7* 9.4*  HCT 30.3* 30.5* 32.3* 30.9*  MCV 97.7 96.8 99.7 98.7  PLT 145* 159 170 532    Basic Metabolic Panel: Recent Labs  Lab 01/10/21 0542 01/11/21 0535 01/12/21 0635 01/13/21 0622 01/14/21 0608  NA 141 139 139 140 138  K 3.2* 3.3* 3.5 3.9 3.8  CL 104 102 104 103 102  CO2 26 24 24 23 24   GLUCOSE 174* 207* 209* 172* 151*  BUN 79* 84* 89* 97* 105*  CREATININE 4.12* 4.31* 4.37* 4.75* 4.67*  CALCIUM 6.7* 6.7* 6.5* 6.7* 6.9*  MG  --   --   --  1.3* 2.0  PHOS 4.1 4.5 4.2 4.7* 4.7*    GFR: Estimated Creatinine Clearance: 14.2 mL/min (A) (by C-G formula based on SCr of 4.67 mg/dL (H)).  Liver Function Tests: Recent Labs  Lab 01/10/21 0542 01/11/21 0535 01/12/21 0635 01/13/21 0622 01/14/21 0608  ALBUMIN 2.1* 2.0* 1.9* 2.0* 2.0*     CBG: Recent Labs  Lab 01/13/21 0745 01/13/21 1159 01/13/21 1656 01/13/21 2112 01/14/21 0305  GLUCAP 167* 371* 204* 149* 138*    Recent Results (from the past 240 hour(s))  Culture, Urine     Status: Abnormal   Collection Time: 01/05/21  5:22 PM   Specimen: Urine, Clean Catch  Result Value Ref Range Status   Specimen Description   Final    URINE, CLEAN CATCH Performed at [facility], [address], [place], [place] [address]    Special Requests   Final    NONE Performed at [facility], [address] [place], [place] [address]    Culture >=100,000 COLONIES/mL ESCHERICHIA COLI (A)  Final   Report Status 01/08/2021 FINAL  Final   Organism ID, Bacteria ESCHERICHIA COLI (A)  Final      Susceptibility   Escherichia coli - MIC*  AMPICILLIN >=32 RESISTANT Resistant     CEFAZOLIN <=4 SENSITIVE Sensitive     CEFEPIME <=0.12 SENSITIVE Sensitive     CEFTRIAXONE <=0.25 SENSITIVE Sensitive     CIPROFLOXACIN <=0.25 SENSITIVE Sensitive     GENTAMICIN >=16 RESISTANT Resistant     IMIPENEM <=0.25 SENSITIVE Sensitive     NITROFURANTOIN <=16 SENSITIVE Sensitive     TRIMETH/SULFA >=320 RESISTANT Resistant     AMPICILLIN/SULBACTAM >=32 RESISTANT Resistant     PIP/TAZO <=4 SENSITIVE Sensitive     * >=100,000 COLONIES/mL ESCHERICHIA COLI     Radiology Studies: No results found.  Scheduled Meds:  sodium chloride   Intravenous Once   alteplase  2 mg Intracatheter Once   alteplase  2 mg Intracatheter Once   aspirin EC  81 mg Oral Daily   calcium carbonate  800 mg of elemental calcium Oral TID WC   Chlorhexidine Gluconate Cloth  6 each Topical Daily   Chlorhexidine Gluconate Cloth  6 each Topical Q0600   cholecalciferol  2,000 Units Oral Daily   darbepoetin (ARANESP) injection - NON-DIALYSIS  100 mcg Subcutaneous Q Tue-1800   diltiazem  180 mg Oral Daily   insulin aspart  0-5 Units Subcutaneous QHS   insulin aspart  0-9 Units Subcutaneous TID WC   insulin aspart  5 Units  Subcutaneous TID WC   insulin glargine  10 Units Subcutaneous QHS   lidocaine  1 patch Transdermal Daily   lipase/protease/amylase  12,000 Units Oral TID AC   mouth rinse  15 mL Mouth Rinse BID   metoprolol tartrate  50 mg Oral BID   pravastatin  10 mg Oral q1800   saccharomyces boulardii  250 mg Oral BID   sodium bicarbonate  1,300 mg Oral BID   valACYclovir  500 mg Oral Daily   Continuous Infusions:  sodium chloride     sodium chloride      LOS: 22 days   Time spent: 35 mins   Tameshia Bonneville Wynetta Emery, MD How to contact the [facility] Attending or Consulting provider [place] or covering provider during after hours [place], for this patient?  Check the care team in [facility] and look for a) attending/consulting TR[facility] provider listed and b) the [facility] team listed Log into [email or web address] and use [place] universal password to access. If you do not have the password, please contact the hospital operator. Locate the [facility] provider you are looking for under [facility] and page to a number that you can be directly reached. If you still have difficulty reaching the provider, please page the [facility] (Director on Call) for the Hospitalists listed on amion for assistance.  01/14/2021, 10:50 AM

## 2021-01-14 NOTE — Progress Notes (Signed)
Pt states that he has had burning, aching pain in left lower shin area all night, states is feels very similar to the type of pain/burning he had on the top of his left foot for past 2 days. Upon exam, I note that pt has developed 2 small serous blisters to top on left foot and a patch of multiple small serous blisters on his left mediolateral shin area that were not present yesterday. When questioned, pt states these are the areas that have been hurting for past 3 days. MD Wynetta Emery notified of findings.

## 2021-01-14 NOTE — Progress Notes (Signed)
MD Wynetta Emery in to evaluate left leg and foot blisters. MD states active shingles, pt notified and states understanding. Pt placed on airborne and contact precautions per protocol. Pt's wife called and updated as well, states understanding.

## 2021-01-14 NOTE — Progress Notes (Signed)
Pt has been up in chair for past 2 hours, tolerating well. Pt was A&O x4 this am, but when in room to talk with pt and wife, pt having some rambling, confused conversations. Said that he saw puppies in his room yesterday and last night. Advised pt that we have not had any animals here in the hospital. Pt states, "Well, I know I saw them." Also, pt not answering questions as quickly or with appropriate answers. When asked if he was done with his lunch tray, pt stated, "Oh, I think we're still trying to decide if we're gonna get them or not, I don't know if it's worth the money." When asked pt what he was referring to, pt just smiles and says, "Oh, I don't know. I'm just trying to get stronger so I can go home." Wife states that she noticed his conversation was just a "little off", but pt is pleasant and cooperative.

## 2021-01-15 DIAGNOSIS — E872 Acidosis: Secondary | ICD-10-CM | POA: Diagnosis not present

## 2021-01-15 DIAGNOSIS — B029 Zoster without complications: Secondary | ICD-10-CM

## 2021-01-15 DIAGNOSIS — N170 Acute kidney failure with tubular necrosis: Secondary | ICD-10-CM | POA: Diagnosis not present

## 2021-01-15 DIAGNOSIS — G9341 Metabolic encephalopathy: Secondary | ICD-10-CM | POA: Diagnosis not present

## 2021-01-15 LAB — RENAL FUNCTION PANEL
Albumin: 2.3 g/dL — ABNORMAL LOW (ref 3.5–5.0)
Anion gap: 14 (ref 5–15)
BUN: 104 mg/dL — ABNORMAL HIGH (ref 8–23)
CO2: 25 mmol/L (ref 22–32)
Calcium: 7.4 mg/dL — ABNORMAL LOW (ref 8.9–10.3)
Chloride: 102 mmol/L (ref 98–111)
Creatinine, Ser: 5.03 mg/dL — ABNORMAL HIGH (ref 0.61–1.24)
GFR, Estimated: 11 mL/min — ABNORMAL LOW (ref >60)
Glucose, Bld: 135 mg/dL — ABNORMAL HIGH (ref 70–99)
Phosphorus: 4.9 mg/dL — ABNORMAL HIGH (ref 2.5–4.6)
Potassium: 4.8 mmol/L (ref 3.5–5.1)
Sodium: 141 mmol/L (ref 135–145)

## 2021-01-15 LAB — GLUCOSE, CAPILLARY
Glucose-Capillary: 146 mg/dL — ABNORMAL HIGH (ref 70–99)
Glucose-Capillary: 259 mg/dL — ABNORMAL HIGH (ref 70–99)
Glucose-Capillary: 149 mg/dL — ABNORMAL HIGH (ref 70–99)
Glucose-Capillary: 123 mg/dL — ABNORMAL HIGH (ref 70–99)
Glucose-Capillary: 134 mg/dL — ABNORMAL HIGH (ref 70–99)
Glucose-Capillary: 210 mg/dL — ABNORMAL HIGH (ref 70–99)
Glucose-Capillary: 175 mg/dL — ABNORMAL HIGH (ref 70–99)
Glucose-Capillary: 84 mg/dL (ref 70–99)
Glucose-Capillary: 135 mg/dL — ABNORMAL HIGH (ref 70–99)

## 2021-01-15 MED ORDER — INSULIN ASPART 100 UNIT/ML IJ SOLN
4.0000 [IU] | Freq: Three times a day (TID) | INTRAMUSCULAR | Status: DC
  Administered 2021-01-16 (×2): 4 [IU] via SUBCUTANEOUS

## 2021-01-15 MED ORDER — HYDROXYZINE HCL 10 MG PO TABS
10.0000 mg | ORAL_TABLET | Freq: Two times a day (BID) | ORAL | Status: DC | PRN
  Administered 2021-01-24: 10 mg via ORAL
  Filled 2021-01-15: qty 1

## 2021-01-15 MED ORDER — CALCITRIOL 0.25 MCG PO CAPS
0.2500 ug | ORAL_CAPSULE | Freq: Every day | ORAL | Status: DC
  Administered 2021-01-15 – 2021-01-25 (×10): 0.25 ug via ORAL
  Filled 2021-01-15 (×11): qty 1

## 2021-01-15 NOTE — Progress Notes (Addendum)
Progress Note  Patient Name: Mark VERNIER Sr. Date of Encounter: 01/15/2021  [facility] Cardiologist: Rozann Lesches, MD   Subjective   Wife at bedside. Concerned about patient's confusion, tremors, and need for dialysis. Patient denies palpitations or cardiac complaints  Inpatient Medications    Scheduled Meds:  sodium chloride   Intravenous Once   alteplase  2 mg Intracatheter Once   alteplase  2 mg Intracatheter Once   aspirin EC  81 mg Oral Daily   calcitRIOL  0.25 mcg Oral Daily   calcium carbonate  800 mg of elemental calcium Oral TID WC   Chlorhexidine Gluconate Cloth  6 each Topical Daily   Chlorhexidine Gluconate Cloth  6 each Topical Q0600   cholecalciferol  2,000 Units Oral Daily   darbepoetin (ARANESP) injection - NON-DIALYSIS  100 mcg Subcutaneous Q Tue-1800   diltiazem  180 mg Oral Daily   insulin aspart  0-5 Units Subcutaneous QHS   insulin aspart  0-9 Units Subcutaneous TID WC   insulin aspart  5 Units Subcutaneous TID WC   insulin glargine  10 Units Subcutaneous QHS   lidocaine  1 patch Transdermal Daily   lipase/protease/amylase  12,000 Units Oral TID AC   mouth rinse  15 mL Mouth Rinse BID   metoprolol tartrate  50 mg Oral BID   pravastatin  10 mg Oral q1800   saccharomyces boulardii  250 mg Oral BID   sodium bicarbonate  1,300 mg Oral BID   valACYclovir  500 mg Oral Daily   Continuous Infusions:  sodium chloride     sodium chloride     PRN Meds: sodium chloride, sodium chloride, acetaminophen **OR** acetaminophen, dextrose, heparin, heparin, ondansetron **OR** ondansetron (ZOFRAN) IV   Vital Signs    Vitals:   01/14/21 1417 01/14/21 2133 01/15/21 0500 01/15/21 0604  BP: 131/72 (!) 148/70  (!) 154/83  Pulse: 83 100  (!) 105  Resp: 18 20  20   Temp: 98.5 F (36.9 C) 99.2 F (37.3 C)  98.2 F (36.8 C)  TempSrc: Oral Oral  Oral  SpO2: 97% 96%  97%  Weight:   89.4 kg   Height:        Intake/Output Summary (Last 24 hours) at  01/15/2021 1045 Last data filed at 01/15/2021 0900 Gross per 24 hour  Intake 720 ml  Output 600 ml  Net 120 ml   Last 3 Weights 01/15/2021 01/14/2021 01/12/2021  Weight (lbs) 197 lb 1.5 oz 201 lb 11.5 oz 207 lb 3.7 oz  Weight (kg) 89.4 kg 91.5 kg 94 kg      Telemetry    No longer on telmetry  ECG       Physical Exam    GEN: No acute distress.   Neck: No JVD Cardiac: RRR, no murmurs, rubs, or gallops.  Respiratory: decreased breath sounds with diffuse rhonchi throughout GI: Soft, nontender, non-distended  MS: plus 1 edema bilaterally, shingles outbreak left shin; No deformity. Neuro:  Nonfocal  Psych: confusion  Labs    High Sensitivity Troponin:   Recent Labs  Lab 12/23/20 1153 12/23/20 1324  TROPONINIHS 43* 41*      Chemistry Recent Labs  Lab 01/13/21 0622 01/14/21 0608 01/15/21 0625  NA 140 138 141  K 3.9 3.8 4.8  CL 103 102 102  CO2 23 24 25   GLUCOSE 172* 151* 135*  BUN 97* 105* 104*  CREATININE 4.75* 4.67* 5.03*  CALCIUM 6.7* 6.9* 7.4*  ALBUMIN 2.0* 2.0* 2.3*  GFRNONAA 12*  12* 11*  ANIONGAP 14 12 14      Hematology Recent Labs  Lab 01/09/21 0706 01/10/21 0542 01/13/21 0622  WBC 7.4 8.3 7.3  RBC 3.15* 3.24* 3.13*  HGB 9.6* 9.7* 9.4*  HCT 30.5* 32.3* 30.9*  MCV 96.8 99.7 98.7  MCH 30.5 29.9 30.0  MCHC 31.5 30.0 30.4  RDW 14.2 14.3 14.6  PLT 159 170 199    BNPNo results for input(s): BNP, PROBNP in the last 168 hours.   DDimer No results for input(s): DDIMER in the last 168 hours.   Radiology    No results found.  Cardiac Studies   Echo 12/31/20 IMPRESSIONS     1. Left ventricular ejection fraction, by estimation, is 60 to 65%. The  left ventricle has normal function. The left ventricle has no regional  wall motion abnormalities. There is mild left ventricular hypertrophy.  Left ventricular diastolic parameters  are consistent with Grade I diastolic dysfunction (impaired relaxation).   2. Right ventricular systolic function is  normal. The right ventricular  size is normal. There is normal pulmonary artery systolic pressure.   3. The mitral valve is normal in structure. No evidence of mitral valve  regurgitation. No evidence of mitral stenosis.   4. The aortic valve is tricuspid. Aortic valve regurgitation is not  visualized. Mild aortic valve sclerosis is present, with no evidence of  aortic valve stenosis.   5. Aortic dilatation noted. There is mild dilatation of the aortic root,  measuring 39 mm.   6. The inferior vena cava is normal in size with greater than 50%  respiratory variability, suggesting right atrial pressure of 3 mmHg.     Patient Profile     80 y.o. male   Assessment & Plan    SVT and questionable bursts of atrial fibrillation although not definitive.  He has also had NSVT.  Cardizem increased 120 mg to 180 mg and also on lopressor 50 mg bid. No longer on telemetry. This is all noted in the setting of COVID-19 approximately 1 month ago, uncontrolled type 2 diabetes mellitus, metabolic encephalopathy, and AKI on CKD stage IIIb. Will f/u as an outpatient once discharged. Can always place Zio monitor to evaluate further if needed.  AKI/CKD stage IIIb, non-oliguric - presumably ischemic ATN in setting of volume depletion, hypotension, and covid-19 infection.  Required HD on 12/29/20 due to azotemia and last HD session 01/08/21.   CAD coronary calcifications by prior CT with low-risk NST in 2013. He has not required intervention   HTN-controlled  Covid 19 infection treated with Remdesivir-no longer on isolation precautions for covid  Shingles outbreak left shin on isolation precautions.   [facility] will sign off.   Medication Recommendations:  current cardiac meds with increased diltiazem dose of 180 mg daily Other recommendations (labs, testing, etc):   Follow up as an outpatient:  Dr. McDowell/APP in next month  For questions or updates, please contact [facility] Please consult  [email or web address] for contact info under        Signed, Ermalinda Barrios, PA-C  01/15/2021, 10:45 AM    Patient examined chart reviewed Rhythm is stable continue cardizem management of COVID infection and renal failure per primary service F/U with Dr Elvia Collum arranged  Jenkins Rouge MD [facility]

## 2021-01-15 NOTE — Progress Notes (Signed)
HD cath removed per MD orders. No complications noted. No bleeding. Tip intact.

## 2021-01-15 NOTE — Progress Notes (Addendum)
PROGRESS NOTE   [name]  BJY:[id number] DOB: 16-Oct-1940 DOA: 12/23/2020 PCP: [name], MD   Chief Complaint  Patient presents with   Weakness   Level of care: Med-Surg  Brief Admission History:  80 year old gentleman with type 2 diabetes mellitus, stage IIIb CKD, dyslipidemia, hypertension, diastolic congestive heart failure and coronary artery disease presented with increasing weakness and confusion.  He was recently diagnosed with COVID-19 on 12/09/2020.  He was admitted with acute kidney injury.  Assessment & Plan:   Active Problems:   AKI (acute kidney injury) ([place])   Acute renal failure superimposed on stage 3b chronic kidney disease (HCC)   Acute metabolic encephalopathy   COVID-19 virus infection   Metabolic acidosis   SVT (supraventricular tachycardia) (HCC)   Pressure injury of skin   Shingles   AKI on CKD stage IIIb- patient is still requiring dialysis at this point.  He has a left internal jugular HD catheter in place since 12/29/2020.  Nephrology is following closely.  Pt having good urine output and creatinine holding stable however BUN is rising.  Plan is for SNF placement when medically cleared.  It is looking like renal function has stabilized and nephrology team removing HD cath today.     Acute shingles outbreak - involving the left foot and leg in a dermatomal pattern. Discussed with pharmD and starting oral valtrex renally dosed.  Start airborne and contact precautions.  Can dc airborne when lesions have crusted and dried up.  Pt strongly advised not to touch the lesions as he could spread the infection further.  He was advised to avoid touching eyes.    Acute metabolic encephalopathy- RESOLVED.  This has been intermittent but today he seems much better and oriented.  Continue to monitor closely.  Hypocalcemia-being treated with Tums and vitamin D.  Status post calcium gluconate on 01/05/2021.  Anemia in CKD- he was transfused 2 units PRBC on  01/07/2021.  DKA-resolved  Uncontrolled type 2 diabetes mellitus with renal complications-continue SSI coverage and prandial coverage and frequent CBG monitoring. CBG (last 3)  Recent Labs    01/15/21 0724 01/15/21 1259 01/15/21 1613  GLUCAP 134* 175* 84   Continue novolog to 5 units TID with meals and lantus 10 units daily.   Recent COVID-19 infection- patient status post 3 days of remdesivir for asymptomatic infection.  He has been stable on room air.  He is now on 30 days post diagnosis and no longer in quarantine.  Thrombocytopenia-resolved.  Hypokalemia- repleted.   SVT/A. fib with RVR- Cardizem and Lopressor as ordered by cardiology service.  Increased cardizem to 180 mg per cardio recs. Continue metoprolol 50 mg BID.   Coronary artery disease- no chest pain symptoms at this time.  Continue aspirin and beta-blocker therapy.  Severe  hypomagnesemia - this has been repleted.   Resting tremor - Possibly related to severe low Mg levels, seems to be improving, follow closely.   Will recommend outpatient neurology consultation.   Essential hypertension- stable on current medications.  Hyperlipidemia-resume home statin therapy.  He is on pravastatin 10 mg daily.  Aortic root dilatation-this is being followed outpatient with cardiology.  DVT prophylaxis: SCDs Code Status: Full  Family Communication: wife updated by telephone 6/11 Disposition: SNF  Status is: Inpatient  Remains inpatient appropriate because:IV treatments appropriate due to intensity of illness or inability to take PO and Inpatient level of care appropriate due to severity of illness   Dispo: The patient is from: Home  Anticipated d/c is to: SNF              Patient currently is not medically stable to d/c.   Difficult to place patient No   Consultants:  Cardiology Nephrology   Procedures:    Antimicrobials:   Valtrex 01/14/21>>  Subjective: Pt c/o itching from Shingles lesions.        Objective: Vitals:   01/14/21 2133 01/15/21 0500 01/15/21 0604 01/15/21 1416  BP: (!) 148/70  (!) 154/83 121/66  Pulse: 100  (!) 105 87  Resp: 20  20 18   Temp: 99.2 F (37.3 C)  98.2 F (36.8 C) 98.5 F (36.9 C)  TempSrc: Oral  Oral Oral  SpO2: 96%  97% 97%  Weight:  89.4 kg    Height:        Intake/Output Summary (Last 24 hours) at 01/15/2021 1616 Last data filed at 01/15/2021 1300 Gross per 24 hour  Intake 830 ml  Output 600 ml  Net 230 ml   Filed Weights   01/12/21 0700 01/14/21 0544 01/15/21 0500  Weight: 94 kg 91.5 kg 89.4 kg   Examination:  General exam: Chronically ill-appearing male, appears calm and comfortable  Respiratory system: Clear to auscultation. Respiratory effort normal. Cardiovascular system: normal S1 & S2 heard. No JVD, murmurs, rubs, gallops or clicks. No pedal edema. Gastrointestinal system: Abdomen is nondistended, soft and nontender. No organomegaly or masses felt. Normal bowel sounds heard. Central nervous system: Alert and oriented. No focal neurological deficits. Extremities: Symmetric 5 x 5 power. Skin: herpetic blisters seen left lower leg and foot c/w shingles outbreak. Lesions have NOT healed yet.   Psychiatry: Judgement and insight appear poor. Mood & affect flat.   Data Reviewed: I have personally reviewed following labs and imaging studies  CBC: Recent Labs  Lab 01/09/21 0706 01/10/21 0542 01/13/21 0622  WBC 7.4 8.3 7.3  HGB 9.6* 9.7* 9.4*  HCT 30.5* 32.3* 30.9*  MCV 96.8 99.7 98.7  PLT 159 170 315    Basic Metabolic Panel: Recent Labs  Lab 01/11/21 0535 01/12/21 0635 01/13/21 0622 01/14/21 0608 01/15/21 0625  NA 139 139 140 138 141  K 3.3* 3.5 3.9 3.8 4.8  CL 102 104 103 102 102  CO2 24 24 23 24 25   GLUCOSE 207* 209* 172* 151* 135*  BUN 84* 89* 97* 105* 104*  CREATININE 4.31* 4.37* 4.75* 4.67* 5.03*  CALCIUM 6.7* 6.5* 6.7* 6.9* 7.4*  MG  --   --  1.3* 2.0  --   PHOS 4.5 4.2 4.7* 4.7* 4.9*     GFR: Estimated Creatinine Clearance: 13.1 mL/min (A) (by C-G formula based on SCr of 5.03 mg/dL (H)).  Liver Function Tests: Recent Labs  Lab 01/11/21 0535 01/12/21 0635 01/13/21 0622 01/14/21 0608 01/15/21 0625  ALBUMIN 2.0* 1.9* 2.0* 2.0* 2.3*    CBG: Recent Labs  Lab 01/14/21 2136 01/15/21 0322 01/15/21 0724 01/15/21 1259 01/15/21 1613  GLUCAP 191* 123* 134* 175* 84    Recent Results (from the past 240 hour(s))  Culture, Urine     Status: Abnormal   Collection Time: 01/05/21  5:22 PM   Specimen: Urine, Clean Catch  Result Value Ref Range Status   Specimen Description   Final    URINE, CLEAN CATCH Performed at [facility], [address], [place], [place] [address]    Special Requests   Final    NONE Performed at [facility], [address], [place], [place] [address]    Culture >=100,000  COLONIES/mL ESCHERICHIA COLI (A)  Final   Report Status 01/08/2021 FINAL  Final   Organism ID, Bacteria ESCHERICHIA COLI (A)  Final      Susceptibility   Escherichia coli - MIC*    AMPICILLIN >=32 RESISTANT Resistant     CEFAZOLIN <=4 SENSITIVE Sensitive     CEFEPIME <=0.12 SENSITIVE Sensitive     CEFTRIAXONE <=0.25 SENSITIVE Sensitive     CIPROFLOXACIN <=0.25 SENSITIVE Sensitive     GENTAMICIN >=16 RESISTANT Resistant     IMIPENEM <=0.25 SENSITIVE Sensitive     NITROFURANTOIN <=16 SENSITIVE Sensitive     TRIMETH/SULFA >=320 RESISTANT Resistant     AMPICILLIN/SULBACTAM >=32 RESISTANT Resistant     PIP/TAZO <=4 SENSITIVE Sensitive     * >=100,000 COLONIES/mL ESCHERICHIA COLI     Radiology Studies: No results found.  Scheduled Meds:  sodium chloride   Intravenous Once   alteplase  2 mg Intracatheter Once   alteplase  2 mg Intracatheter Once   aspirin EC  81 mg Oral Daily   calcitRIOL  0.25 mcg Oral Daily   calcium carbonate  800 mg of elemental calcium Oral TID WC   Chlorhexidine Gluconate Cloth  6 each Topical Daily   Chlorhexidine Gluconate Cloth  6 each  Topical Q0600   cholecalciferol  2,000 Units Oral Daily   darbepoetin (ARANESP) injection - NON-DIALYSIS  100 mcg Subcutaneous Q Tue-1800   diltiazem  180 mg Oral Daily   insulin aspart  0-5 Units Subcutaneous QHS   insulin aspart  0-9 Units Subcutaneous TID WC   insulin aspart  5 Units Subcutaneous TID WC   insulin glargine  10 Units Subcutaneous QHS   lidocaine  1 patch Transdermal Daily   lipase/protease/amylase  12,000 Units Oral TID AC   mouth rinse  15 mL Mouth Rinse BID   metoprolol tartrate  50 mg Oral BID   pravastatin  10 mg Oral q1800   saccharomyces boulardii  250 mg Oral BID   sodium bicarbonate  1,300 mg Oral BID   valACYclovir  500 mg Oral Daily   Continuous Infusions:  sodium chloride     sodium chloride      LOS: 23 days   Time spent: 35 mins   Jamisen Duerson Wynetta Emery, MD How to contact the [facility] Attending or Consulting provider [place] or covering provider during after hours [place], for this patient?  Check the care team in [facility] and look for a) attending/consulting TRH provider listed and b) the [facility] team listed Log into [email or web address] and use [place]'s universal password to access. If you do not have the password, please contact the hospital operator. Locate the [facility] provider you are looking for under [facility] and page to a number that you can be directly reached. If you still have difficulty reaching the provider, please page the [facility] (Director on Call) for the Hospitalists listed on amion for assistance.  01/15/2021, 4:16 PM

## 2021-01-15 NOTE — Progress Notes (Signed)
Patient ID: Eli Hose., male   DOB: 1941-07-28, 80 y.o.   MRN: [id number] S: No complaints this am.  "I don't have my hearing aid in" O:BP (!) 154/83 (BP Location: Right Arm)   Pulse (!) 105   Temp 98.2 F (36.8 C) (Oral)   Resp 20   Ht 5' 8.5" (1.74 m)   Wt 89.4 kg   SpO2 97%   BMI 29.53 kg/m   Intake/Output Summary (Last 24 hours) at 01/15/2021 1010 Last data filed at 01/15/2021 0900 Gross per 24 hour  Intake 720 ml  Output 600 ml  Net 120 ml   Intake/Output: I/O last 3 completed shifts: In: 720 [P.O.:720] Out: 1600 [Urine:1600]  Intake/Output this shift:  Total I/O In: 240 [P.O.:240] Out: -  Weight change: -2.1 kg Gen: NAD CVS: RRR Resp: CTA Abd: benign Ext: no edema  Recent Labs  Lab 01/09/21 0706 01/10/21 0542 01/11/21 0535 01/12/21 0635 01/13/21 0622 01/14/21 0608 01/15/21 0625  NA 139 141 139 139 140 138 141  K 3.6 3.2* 3.3* 3.5 3.9 3.8 4.8  CL 102 104 102 104 103 102 102  CO2 28 26 24 24 23 24 25   GLUCOSE 178* 174* 207* 209* 172* 151* 135*  BUN 63* 79* 84* 89* 97* 105* 104*  CREATININE 3.56* 4.12* 4.31* 4.37* 4.75* 4.67* 5.03*  ALBUMIN 2.1* 2.1* 2.0* 1.9* 2.0* 2.0* 2.3*  CALCIUM 6.6* 6.7* 6.7* 6.5* 6.7* 6.9* 7.4*  PHOS 3.5 4.1 4.5 4.2 4.7* 4.7* 4.9*   Liver Function Tests: Recent Labs  Lab 01/13/21 0622 01/14/21 0608 01/15/21 0625  ALBUMIN 2.0* 2.0* 2.3*   No results for input(s): LIPASE, AMYLASE in the last 168 hours. No results for input(s): AMMONIA in the last 168 hours. CBC: Recent Labs  Lab 01/09/21 0706 01/10/21 0542 01/13/21 0622  WBC 7.4 8.3 7.3  HGB 9.6* 9.7* 9.4*  HCT 30.5* 32.3* 30.9*  MCV 96.8 99.7 98.7  PLT 159 170 199   Cardiac Enzymes: No results for input(s): CKTOTAL, CKMB, CKMBINDEX, TROPONINI in the last 168 hours. CBG: Recent Labs  Lab 01/13/21 2112 01/14/21 0305 01/14/21 2136 01/15/21 0322 01/15/21 0724  GLUCAP 149* 138* 191* 123* 134*    Iron Studies: No results for input(s): IRON, TIBC,  TRANSFERRIN, FERRITIN in the last 72 hours. Studies/Results: No results found.  sodium chloride   Intravenous Once   alteplase  2 mg Intracatheter Once   alteplase  2 mg Intracatheter Once   aspirin EC  81 mg Oral Daily   calcium carbonate  800 mg of elemental calcium Oral TID WC   Chlorhexidine Gluconate Cloth  6 each Topical Daily   Chlorhexidine Gluconate Cloth  6 each Topical Q0600   cholecalciferol  2,000 Units Oral Daily   darbepoetin (ARANESP) injection - NON-DIALYSIS  100 mcg Subcutaneous Q Tue-1800   diltiazem  180 mg Oral Daily   insulin aspart  0-5 Units Subcutaneous QHS   insulin aspart  0-9 Units Subcutaneous TID WC   insulin aspart  5 Units Subcutaneous TID WC   insulin glargine  10 Units Subcutaneous QHS   lidocaine  1 patch Transdermal Daily   lipase/protease/amylase  12,000 Units Oral TID AC   mouth rinse  15 mL Mouth Rinse BID   metoprolol tartrate  50 mg Oral BID   pravastatin  10 mg Oral q1800   saccharomyces boulardii  250 mg Oral BID   sodium bicarbonate  1,300 mg Oral BID   valACYclovir  500 mg Oral  Daily    BMET    Component Value Date/Time   NA 141 01/15/2021 0625   K 4.8 01/15/2021 0625   CL 102 01/15/2021 0625   CO2 25 01/15/2021 0625   GLUCOSE 135 (H) 01/15/2021 0625   BUN 104 (H) 01/15/2021 0625   CREATININE 5.03 (H) 01/15/2021 0625   CREATININE 1.77 (H) 12/30/2019 1143   CALCIUM 7.4 (L) 01/15/2021 0625   GFRNONAA 11 (L) 01/15/2021 0625   GFRNONAA 36 (L) 12/30/2019 1143   GFRAA 42 (L) 12/30/2019 1143   CBC    Component Value Date/Time   WBC 7.3 01/13/2021 0622   RBC 3.13 (L) 01/13/2021 0622   HGB 9.4 (L) 01/13/2021 0622   HCT 30.9 (L) 01/13/2021 0622   PLT 199 01/13/2021 0622   MCV 98.7 01/13/2021 0622   MCH 30.0 01/13/2021 0622   MCHC 30.4 01/13/2021 0622   RDW 14.6 01/13/2021 0622   LYMPHSABS 0.8 12/23/2020 1153   MONOABS 0.6 12/23/2020 1153   EOSABS 0.1 12/23/2020 1153   BASOSABS 0.0 12/23/2020 1153      Assessment/Plan:  AKI/CKD stage IIIb, non-oliguric - presumably ischemic ATN in setting of volume depletion, hypotension, and covid-19 infection.  Required HD on 12/29/20 due to azotemia and last HD session 01/08/21.  HD catheter in place for 2 weeks.  Will remove HD catheter and continue to follow UOP and BUN/Cr.  Appears to have reached a plateau. Acute metabolic encephalopathy - fluctuates day to day.  Did not improve with dialysis so uremia unlikely. HTN - stable Anemia of acute illness - stable Hgb Shingles outbreak - on left leg. Hypocalcemia - improved with supplementation. iPTH 256, will start calcitriol and follow H/o covid-19 infection - no longer on isolation protocol for covid but is for shingles outbreak.  Donetta Potts, MD [employer] [phone]

## 2021-01-16 DIAGNOSIS — G9341 Metabolic encephalopathy: Secondary | ICD-10-CM | POA: Diagnosis not present

## 2021-01-16 DIAGNOSIS — U071 COVID-19: Secondary | ICD-10-CM | POA: Diagnosis not present

## 2021-01-16 DIAGNOSIS — N170 Acute kidney failure with tubular necrosis: Secondary | ICD-10-CM | POA: Diagnosis not present

## 2021-01-16 DIAGNOSIS — E872 Acidosis: Secondary | ICD-10-CM | POA: Diagnosis not present

## 2021-01-16 LAB — RENAL FUNCTION PANEL
Albumin: 2.2 g/dL — ABNORMAL LOW (ref 3.5–5.0)
Anion gap: 14 (ref 5–15)
BUN: 128 mg/dL — ABNORMAL HIGH (ref 8–23)
CO2: 24 mmol/L (ref 22–32)
Calcium: 7.3 mg/dL — ABNORMAL LOW (ref 8.9–10.3)
Chloride: 104 mmol/L (ref 98–111)
Creatinine, Ser: 4.94 mg/dL — ABNORMAL HIGH (ref 0.61–1.24)
GFR, Estimated: 11 mL/min — ABNORMAL LOW (ref >60)
Glucose, Bld: 100 mg/dL — ABNORMAL HIGH (ref 70–99)
Phosphorus: 4.7 mg/dL — ABNORMAL HIGH (ref 2.5–4.6)
Potassium: 3.6 mmol/L (ref 3.5–5.1)
Sodium: 142 mmol/L (ref 135–145)

## 2021-01-16 LAB — URINALYSIS, COMPLETE (UACMP) WITH MICROSCOPIC
Bilirubin Urine: NEGATIVE
Glucose, UA: 50 mg/dL — AB
Ketones, ur: NEGATIVE mg/dL
Nitrite: NEGATIVE
Protein, ur: 30 mg/dL — AB
Specific Gravity, Urine: 1.011 (ref 1.005–1.030)
WBC, UA: >50 WBC/hpf — ABNORMAL HIGH (ref 0–5)
pH: 5 (ref 5.0–8.0)

## 2021-01-16 LAB — GLUCOSE, CAPILLARY
Glucose-Capillary: 103 mg/dL — ABNORMAL HIGH (ref 70–99)
Glucose-Capillary: 112 mg/dL — ABNORMAL HIGH (ref 70–99)
Glucose-Capillary: 114 mg/dL — ABNORMAL HIGH (ref 70–99)
Glucose-Capillary: 120 mg/dL — ABNORMAL HIGH (ref 70–99)
Glucose-Capillary: 195 mg/dL — ABNORMAL HIGH (ref 70–99)
Glucose-Capillary: 331 mg/dL — ABNORMAL HIGH (ref 70–99)
Glucose-Capillary: 54 mg/dL — ABNORMAL LOW (ref 70–99)

## 2021-01-16 LAB — CREATININE, URINE, RANDOM: Creatinine, Urine: 35.88 mg/dL

## 2021-01-16 LAB — SODIUM, URINE, RANDOM: Sodium, Ur: 82 mmol/L

## 2021-01-16 MED ORDER — INSULIN GLARGINE 100 UNIT/ML ~~LOC~~ SOLN
8.0000 [IU] | Freq: Every day | SUBCUTANEOUS | Status: DC
  Administered 2021-01-16 – 2021-01-24 (×9): 8 [IU] via SUBCUTANEOUS
  Filled 2021-01-16 (×10): qty 0.08

## 2021-01-16 MED ORDER — SODIUM CHLORIDE 0.9 % IV SOLN: INTRAVENOUS | Status: AC

## 2021-01-16 MED ORDER — DEXTROSE 50 % IV SOLN
INTRAVENOUS | Status: AC
  Filled 2021-01-16: qty 50

## 2021-01-16 MED ORDER — INSULIN ASPART 100 UNIT/ML IJ SOLN
3.0000 [IU] | Freq: Three times a day (TID) | INTRAMUSCULAR | Status: DC
  Administered 2021-01-17 – 2021-01-19 (×9): 3 [IU] via SUBCUTANEOUS

## 2021-01-16 MED ORDER — DEXTROSE 50 % IV SOLN
25.0000 g | INTRAVENOUS | Status: AC
  Administered 2021-01-16: 25 g via INTRAVENOUS

## 2021-01-16 NOTE — Progress Notes (Signed)
Patient ID: Eli Hose., male   DOB: June 27, 1941, 80 y.o.   MRN: [id number] S:Still remains confused but no complaints. O:BP (!) 147/80 (BP Location: Right Arm)   Pulse 98   Temp 97.6 F (36.4 C)   Resp (!) 22   Ht 5' 8.5" (1.74 m)   Wt 88.6 kg   SpO2 98%   BMI 29.27 kg/m   Intake/Output Summary (Last 24 hours) at 01/16/2021 1227 Last data filed at 01/16/2021 0700 Gross per 24 hour  Intake 830 ml  Output --  Net 830 ml   Intake/Output: I/O last 3 completed shifts: In: 1070 [P.O.:1070] Out: 400 [Urine:400]  Intake/Output this shift:  No intake/output data recorded. Weight change: -0.8 kg Gen: NAD CVS: RRR Resp: CTA Abd: +BS, soft, NT/ND Ext: 1+ pretibial edema  Recent Labs  Lab 01/10/21 0542 01/11/21 0535 01/12/21 0635 01/13/21 0622 01/14/21 0608 01/15/21 0625 01/16/21 0457  NA 141 139 139 140 138 141 142  K 3.2* 3.3* 3.5 3.9 3.8 4.8 3.6  CL 104 102 104 103 102 102 104  CO2 26 24 24 23 24 25 24   GLUCOSE 174* 207* 209* 172* 151* 135* 100*  BUN 79* 84* 89* 97* 105* 104* 128*  CREATININE 4.12* 4.31* 4.37* 4.75* 4.67* 5.03* 4.94*  ALBUMIN 2.1* 2.0* 1.9* 2.0* 2.0* 2.3* 2.2*  CALCIUM 6.7* 6.7* 6.5* 6.7* 6.9* 7.4* 7.3*  PHOS 4.1 4.5 4.2 4.7* 4.7* 4.9* 4.7*   Liver Function Tests: Recent Labs  Lab 01/14/21 0608 01/15/21 0625 01/16/21 0457  ALBUMIN 2.0* 2.3* 2.2*   No results for input(s): LIPASE, AMYLASE in the last 168 hours. No results for input(s): AMMONIA in the last 168 hours. CBC: Recent Labs  Lab 01/10/21 0542 01/13/21 0622  WBC 8.3 7.3  HGB 9.7* 9.4*  HCT 32.3* 30.9*  MCV 99.7 98.7  PLT 170 199   Cardiac Enzymes: No results for input(s): CKTOTAL, CKMB, CKMBINDEX, TROPONINI in the last 168 hours. CBG: Recent Labs  Lab 01/15/21 1613 01/15/21 2156 01/16/21 0327 01/16/21 0726 01/16/21 1118  GLUCAP 84 135* 114* 103* 331*    Iron Studies: No results for input(s): IRON, TIBC, TRANSFERRIN, FERRITIN in the last 72  hours. Studies/Results: No results found.  sodium chloride   Intravenous Once   alteplase  2 mg Intracatheter Once   alteplase  2 mg Intracatheter Once   aspirin EC  81 mg Oral Daily   calcitRIOL  0.25 mcg Oral Daily   calcium carbonate  800 mg of elemental calcium Oral TID WC   cholecalciferol  2,000 Units Oral Daily   darbepoetin (ARANESP) injection - NON-DIALYSIS  100 mcg Subcutaneous Q Tue-1800   diltiazem  180 mg Oral Daily   insulin aspart  0-5 Units Subcutaneous QHS   insulin aspart  0-9 Units Subcutaneous TID WC   insulin aspart  4 Units Subcutaneous TID WC   insulin glargine  10 Units Subcutaneous QHS   lidocaine  1 patch Transdermal Daily   lipase/protease/amylase  12,000 Units Oral TID AC   mouth rinse  15 mL Mouth Rinse BID   metoprolol tartrate  50 mg Oral BID   pravastatin  10 mg Oral q1800   saccharomyces boulardii  250 mg Oral BID   sodium bicarbonate  1,300 mg Oral BID   valACYclovir  500 mg Oral Daily    BMET    Component Value Date/Time   NA 142 01/16/2021 0457   K 3.6 01/16/2021 0457   CL 104 01/16/2021 0457  CO2 24 01/16/2021 0457   GLUCOSE 100 (H) 01/16/2021 0457   BUN 128 (H) 01/16/2021 0457   CREATININE 4.94 (H) 01/16/2021 0457   CREATININE 1.77 (H) 12/30/2019 1143   CALCIUM 7.3 (L) 01/16/2021 0457   GFRNONAA 11 (L) 01/16/2021 0457   GFRNONAA 36 (L) 12/30/2019 1143   GFRAA 42 (L) 12/30/2019 1143   CBC    Component Value Date/Time   WBC 7.3 01/13/2021 0622   RBC 3.13 (L) 01/13/2021 0622   HGB 9.4 (L) 01/13/2021 0622   HCT 30.9 (L) 01/13/2021 0622   PLT 199 01/13/2021 0622   MCV 98.7 01/13/2021 0622   MCH 30.0 01/13/2021 0622   MCHC 30.4 01/13/2021 0622   RDW 14.6 01/13/2021 0622   LYMPHSABS 0.8 12/23/2020 1153   MONOABS 0.6 12/23/2020 1153   EOSABS 0.1 12/23/2020 1153   BASOSABS 0.0 12/23/2020 1153    Assessment/Plan:   AKI/CKD stage IIIb, non-oliguric - presumably ischemic ATN in setting of volume depletion, hypotension, and  covid-19 infection.  Required HD on 12/29/20 due to azotemia and last HD session 01/08/21.  HD catheter in place for 2 weeks.  Will remove HD catheter and continue to follow UOP and BUN/Cr.  Appears to have reached a plateau. Scr improved but BUN rising. Will check FeNA and start gentle IVF's as he appears pre-renal. If he requires ongoing dialysis he will need a tunneled HD catheter to be placed. Acute metabolic encephalopathy - fluctuates day to day.  Did not improve with dialysis so uremia unlikely. HTN - stable Anemia of acute illness - stable Hgb.  On ESA  Shingles outbreak - on left leg. Hypocalcemia - improved with supplementation. iPTH 256, will start calcitriol and follow H/o covid-19 infection - no longer on isolation protocol for covid but is for shingles outbreak. Disposition - awaiting SNF placement when medically stable.   Donetta Potts, MD [employer] [phone]

## 2021-01-16 NOTE — Progress Notes (Signed)
CBG 54, hypoglycemic protocol initiated. Dr. Wynetta Emery notified.

## 2021-01-16 NOTE — Progress Notes (Signed)
PROGRESS NOTE   [name]  ZHY:[id number] DOB: 1941/08/04 DOA: 12/23/2020 PCP: [name], MD   Chief Complaint  Patient presents with   Weakness   Level of care: Med-Surg  Brief Admission History:  80 year old gentleman with type 2 diabetes mellitus, stage IIIb CKD, dyslipidemia, hypertension, diastolic congestive heart failure and coronary artery disease presented with increasing weakness and confusion.  He was recently diagnosed with COVID-19 on 12/09/2020.  He was admitted with acute kidney injury.  Assessment & Plan:   Active Problems:   AKI (acute kidney injury) ([place])   Acute renal failure superimposed on stage 3b chronic kidney disease (HCC)   Acute metabolic encephalopathy   COVID-19 virus infection   Metabolic acidosis   SVT (supraventricular tachycardia) (HCC)   Pressure injury of skin   Shingles  AKI on CKD stage IIIb- patient is still requiring dialysis at this point.  He has a left internal jugular HD catheter in place since 12/29/2020.  Nephrology is following closely.  Pt having good urine output and creatinine holding stable however BUN is rising.  Plan is for SNF placement when medically cleared.  It is looking like renal function has stabilized and nephrology team removed HD cath 6/13.      Acute shingles outbreak - involving the left foot and leg in a dermatomal pattern. Discussed with pharmD and starting oral valtrex renally dosed.  Continue airborne and contact precautions.  Can dc airborne when lesions have crusted and dried up.  Pt strongly advised not to touch the lesions as he could spread the infection further.  He was advised to avoid touching eyes.  Continue valtrex for at least 7 days.   Acute metabolic encephalopathy- RESOLVED.  This has been intermittent but today he seems much better and oriented.  Continue to monitor closely.  Hypocalcemia-being treated with Tums and vitamin D.  Status post calcium gluconate on 01/05/2021.  Anemia in  CKD- he was transfused 2 units PRBC on 01/07/2021.  DKA-resolved  Uncontrolled type 2 diabetes mellitus with renal complications-continue SSI coverage and prandial coverage and frequent CBG monitoring. CBG (last 3)  Recent Labs    01/15/21 2156 01/16/21 0327 01/16/21 0726  GLUCAP 135* 114* 103*   Continue novolog to 5 units TID with meals and lantus 10 units daily.   Recent COVID-19 infection- patient status post 3 days of remdesivir for asymptomatic infection.  He has been stable on room air.  He is now on 30 days post diagnosis and no longer in quarantine.  Thrombocytopenia-resolved.  Hypokalemia- repleted.   SVT/A. fib with RVR- Cardizem and Lopressor as ordered by cardiology service.  Increased cardizem to 180 mg per cardio recs. Continue metoprolol 50 mg BID.   Coronary artery disease- no chest pain symptoms at this time.  Continue aspirin and beta-blocker therapy.  Severe  hypomagnesemia - this has been repleted.   Resting tremor - Possibly related to severe low Mg levels, seems to be improving, follow closely.   Will recommend outpatient neurology consultation.   Essential hypertension- stable on current medications.  Hyperlipidemia-resume home statin therapy.  He is on pravastatin 10 mg daily.  Aortic root dilatation-this is being followed outpatient with cardiology.  DVT prophylaxis: SCDs Code Status: Full  Family Communication: wife updated by telephone 6/11 Disposition: SNF  Status is: Inpatient  Remains inpatient appropriate because:IV treatments appropriate due to intensity of illness or inability to take PO and Inpatient level of care appropriate due to severity of illness  Dispo: The patient is from: Home              Anticipated d/c is to: SNF              Patient currently is not medically stable to d/c.   Difficult to place patient No   Consultants:  Cardiology Nephrology   Procedures:    Antimicrobials:   Valtrex 01/14/21>>  Subjective: Pt  c/o persistent pruritus from the shingles lesions but the pain is getting better. He is intermittently confused.       Objective: Vitals:   01/15/21 0604 01/15/21 1416 01/16/21 0500 01/16/21 0627  BP: (!) 154/83 121/66  (!) 147/80  Pulse: (!) 105 87  98  Resp: 20 18  (!) 22  Temp: 98.2 F (36.8 C) 98.5 F (36.9 C)  97.6 F (36.4 C)  TempSrc: Oral Oral    SpO2: 97% 97%  98%  Weight:   88.6 kg   Height:        Intake/Output Summary (Last 24 hours) at 01/16/2021 1111 Last data filed at 01/16/2021 0700 Gross per 24 hour  Intake 830 ml  Output --  Net 830 ml   Filed Weights   01/14/21 0544 01/15/21 0500 01/16/21 0500  Weight: 91.5 kg 89.4 kg 88.6 kg   Examination:  General exam: Chronically ill-appearing male, appears calm and comfortable  Respiratory system: Clear to auscultation. Respiratory effort normal. Cardiovascular system: normal S1 & S2 heard. No JVD, murmurs, rubs, gallops or clicks. No pedal edema. Gastrointestinal system: Abdomen is nondistended, soft and nontender. No organomegaly or masses felt. Normal bowel sounds heard. Central nervous system: Alert and oriented. No focal neurological deficits. Extremities: Symmetric 5 x 5 power. Skin: herpetic blisters seen left lower leg and foot c/w shingles outbreak. Lesions have NOT fully healed yet.   Psychiatry: Judgement and insight appear poor. Mood & affect flat.   Data Reviewed: I have personally reviewed following labs and imaging studies  CBC: Recent Labs  Lab 01/10/21 0542 01/13/21 0622  WBC 8.3 7.3  HGB 9.7* 9.4*  HCT 32.3* 30.9*  MCV 99.7 98.7  PLT 170 694    Basic Metabolic Panel: Recent Labs  Lab 01/12/21 0635 01/13/21 0622 01/14/21 0608 01/15/21 0625 01/16/21 0457  NA 139 140 138 141 142  K 3.5 3.9 3.8 4.8 3.6  CL 104 103 102 102 104  CO2 24 23 24 25 24   GLUCOSE 209* 172* 151* 135* 100*  BUN 89* 97* 105* 104* 128*  CREATININE 4.37* 4.75* 4.67* 5.03* 4.94*  CALCIUM 6.5* 6.7* 6.9* 7.4*  7.3*  MG  --  1.3* 2.0  --   --   PHOS 4.2 4.7* 4.7* 4.9* 4.7*    GFR: Estimated Creatinine Clearance: 13.2 mL/min (A) (by C-G formula based on SCr of 4.94 mg/dL (H)).  Liver Function Tests: Recent Labs  Lab 01/12/21 0635 01/13/21 0622 01/14/21 0608 01/15/21 0625 01/16/21 0457  ALBUMIN 1.9* 2.0* 2.0* 2.3* 2.2*    CBG: Recent Labs  Lab 01/15/21 1259 01/15/21 1613 01/15/21 2156 01/16/21 0327 01/16/21 0726  GLUCAP 175* 84 135* 114* 103*    No results found for this or any previous visit (from the past 240 hour(s)).    Radiology Studies: No results found.  Scheduled Meds:  sodium chloride   Intravenous Once   alteplase  2 mg Intracatheter Once   alteplase  2 mg Intracatheter Once   aspirin EC  81 mg Oral Daily   calcitRIOL  0.25 mcg  Oral Daily   calcium carbonate  800 mg of elemental calcium Oral TID WC   cholecalciferol  2,000 Units Oral Daily   darbepoetin (ARANESP) injection - NON-DIALYSIS  100 mcg Subcutaneous Q Tue-1800   diltiazem  180 mg Oral Daily   insulin aspart  0-5 Units Subcutaneous QHS   insulin aspart  0-9 Units Subcutaneous TID WC   insulin aspart  4 Units Subcutaneous TID WC   insulin glargine  10 Units Subcutaneous QHS   lidocaine  1 patch Transdermal Daily   lipase/protease/amylase  12,000 Units Oral TID AC   mouth rinse  15 mL Mouth Rinse BID   metoprolol tartrate  50 mg Oral BID   pravastatin  10 mg Oral q1800   saccharomyces boulardii  250 mg Oral BID   sodium bicarbonate  1,300 mg Oral BID   valACYclovir  500 mg Oral Daily   Continuous Infusions:  sodium chloride     sodium chloride      LOS: 24 days   Time spent: 35 mins   Emil Klassen Wynetta Emery, MD How to contact the [facility] Attending or Consulting provider [place] or covering provider during after hours [place], for this patient?  Check the care team in [facility] and look for a) attending/consulting TRH provider listed and b) the [facility] team listed Log into [email or web address] and use [place]'s  universal password to access. If you do not have the password, please contact the hospital operator. Locate the [facility] provider you are looking for under [facility] and page to a number that you can be directly reached. If you still have difficulty reaching the provider, please page the [facility] (Director on Call) for the Hospitalists listed on amion for assistance.  01/16/2021, 11:11 AM

## 2021-01-16 NOTE — TOC Progression Note (Signed)
Transition of Care (TOC) - Progression Note    Patient Details  Name: Mark SHAMBLIN Sr. MRN: [id number] Date of Birth: 02-11-1941  Transition of [facility]) CM/SW Contact  Ihor Gully, LCSW Phone Number: 01/16/2021, 11:52 AM  Clinical Narrative:    Voicemail message left for [name] to discuss that patient's bed offer from [facility] was recinded due to facility being unable to hold the bed any longer. Has bed offer from [facility]. TOC will also have to start patient's authorization again as it expired.    Expected Discharge Plan: Skilled Nursing Facility Barriers to Discharge: Continued Medical Work up  Expected Discharge Plan and Services Expected Discharge Plan: [place] Choice: [place] arrangements for the past 2 months: Single Family Home                                       Social Determinants of Health (SDOH) Interventions    Readmission Risk Interventions Readmission Risk Prevention Plan 12/04/2018  Transportation Screening Complete  PCP or Specialist Appt within 3-5 Days (No Data)  [place] or [place] Complete  Social Work Consult for [place] Planning/Counseling Complete  Palliative Care Screening Not Applicable  Medication Review Press photographer) Complete  Some recent data might be hidden

## 2021-01-16 NOTE — Progress Notes (Signed)
Physical Therapy Treatment Patient Details Name: Mark TOROSYAN Sr. MRN: [id number] DOB: Aug 16, 1940 Today's Date: 01/16/2021    History of Present Illness Mark L Minnich Sr. is a 80 y.o. male with medical history significant for type 2 diabetes, CKD stage IIIb, hypertension, dyslipidemia, diastolic CHF, and CAD described to the ED via EMS with increasing weakness and confusion along with poor appetite for the last 2 to 3 days.  Symptoms have been present over the past week and patient was diagnosed with COVID-19 infection on May 7.  He saw his PCP on 5/19 for confusion and was not noted to be hypoxemic at that time.  He was told to hold his Lasix and encouraged to drink plenty of fluids, but he really has not been able to drink very much fluids according to the wife at the bedside.  He was started on some mild delirium related to his COVID infection.  Unfortunately, his symptoms have progressed significantly to days.  Patient cannot give any further history at this time given his severe confusion.  Primary historian his wife at bedside.    PT Comments    Patient demonstrates improvement requiring less assistance for rolling to side and sitting up from side lying position with use of bed rail and HOB slightly raised.  Patient limited to taking side steps and few steps forward/backwards before having to stop due to fatigue, able to complete 4 standing partial squats before having to sit due to BLE weakness and fatigue.  Patient tolerated sitting up in chair after therapy.  Patient will benefit from continued physical therapy in hospital and recommended venue below to increase strength, balance, endurance for safe ADLs and gait.     Follow Up Recommendations  SNF;Supervision - Intermittent;Supervision for mobility/OOB     Equipment Recommendations  None recommended by PT    Recommendations for Other Services       Precautions / Restrictions Precautions Precautions:  Fall Restrictions Weight Bearing Restrictions: No    Mobility  Bed Mobility Overal bed mobility: Needs Assistance Bed Mobility: Supine to Sit Rolling: Supervision;Min guard Sidelying to sit: Min assist;Min guard;HOB elevated       General bed mobility comments: very slow labored unsteady movement requiring use of bed rail    Transfers Overall transfer level: Needs assistance Equipment used: Rolling walker (2 wheeled) Transfers: Sit to/from [facility] Sit to Stand: Mod assist Stand pivot transfers: Min assist       General transfer comment: has difficulty for sit to stands due to BLE weakness  Ambulation/Gait Ambulation/Gait assistance: Min assist;Mod assist Gait Distance (Feet): 10 Feet Assistive device: Rolling walker (2 wheeled) Gait Pattern/deviations: Decreased step length - right;Decreased step length - left;Decreased stride length Gait velocity: decreased   General Gait Details: very unsteady labored movement taking side steps at beside, limited to 2-3 forward steps before having to stop due to fatigue   Stairs             Wheelchair Mobility    Modified Rankin (Stroke Patients Only)       Balance Overall balance assessment: Needs assistance Sitting-balance support: Feet supported;No upper extremity supported Sitting balance-Leahy Scale: Fair Sitting balance - Comments: fair/good seated at EOB, had to support self with BUE when completing exercises   Standing balance support: During functional activity;Bilateral upper extremity supported Standing balance-Leahy Scale: Poor Standing balance comment: fair/poor using RW  Cognition Arousal/Alertness: Awake/alert Behavior During Therapy: WFL for tasks assessed/performed Overall Cognitive Status: Within Functional Limits for tasks assessed                                        Exercises General Exercises - Lower Extremity Long  Arc Quad: AROM;Strengthening;Seated;Both;10 reps Hip Flexion/Marching: AROM;Seated;Strengthening;Both;10 reps Toe Raises: AROM;Seated;Strengthening;Both;5 reps Heel Raises: AROM;Seated;Strengthening;Both;15 reps    General Comments        Pertinent Vitals/Pain Pain Assessment: No/denies pain    Home Living                      Prior Function            PT Goals (current goals can now be found in the care plan section) Acute Rehab PT Goals Patient Stated Goal: return home after rehab PT Goal Formulation: With patient/family Time For Goal Achievement: 01/30/21 Potential to Achieve Goals: Good Progress towards PT goals: Progressing toward goals    Frequency    Min 3X/week      PT Plan Current plan remains appropriate    Co-evaluation              AM-PAC PT "6 Clicks" Mobility   Outcome Measure  Help needed turning from your back to your side while in a flat bed without using bedrails?: A Little Help needed moving from lying on your back to sitting on the side of a flat bed without using bedrails?: A Little Help needed moving to and from a bed to a chair (including a wheelchair)?: A Lot Help needed standing up from a chair using your arms (e.g., wheelchair or bedside chair)?: A Lot Help needed to walk in hospital room?: A Lot Help needed climbing 3-5 steps with a railing? : Total 6 Click Score: 13    End of Session   Activity Tolerance: Patient tolerated treatment well;Patient limited by fatigue Patient left: in chair;with call bell/phone within reach Nurse Communication: Mobility status PT Visit Diagnosis: Unsteadiness on feet (R26.81);Other abnormalities of gait and mobility (R26.89);Muscle weakness (generalized) (M62.81)     Time: 3013-1438 PT Time Calculation (min) (ACUTE ONLY): 21 min  Charges:  $Therapeutic Exercise: 8-22 mins $Therapeutic Activity: 8-22 mins                     2:00 PM, 01/16/21 Lonell Grandchild, MPT Physical Therapist  with [facility] 336 [phone] office [phone] mobile phone

## 2021-01-17 DIAGNOSIS — G9341 Metabolic encephalopathy: Secondary | ICD-10-CM | POA: Diagnosis not present

## 2021-01-17 DIAGNOSIS — U071 COVID-19: Secondary | ICD-10-CM | POA: Diagnosis not present

## 2021-01-17 DIAGNOSIS — E872 Acidosis: Secondary | ICD-10-CM | POA: Diagnosis not present

## 2021-01-17 DIAGNOSIS — N179 Acute kidney failure, unspecified: Secondary | ICD-10-CM | POA: Diagnosis not present

## 2021-01-17 LAB — RENAL FUNCTION PANEL
Albumin: 2.3 g/dL — ABNORMAL LOW (ref 3.5–5.0)
Anion gap: 13 (ref 5–15)
BUN: 114 mg/dL — ABNORMAL HIGH (ref 8–23)
CO2: 23 mmol/L (ref 22–32)
Calcium: 7.5 mg/dL — ABNORMAL LOW (ref 8.9–10.3)
Chloride: 105 mmol/L (ref 98–111)
Creatinine, Ser: 4.8 mg/dL — ABNORMAL HIGH (ref 0.61–1.24)
GFR, Estimated: 12 mL/min — ABNORMAL LOW (ref >60)
Glucose, Bld: 126 mg/dL — ABNORMAL HIGH (ref 70–99)
Phosphorus: 4.6 mg/dL (ref 2.5–4.6)
Potassium: 3.8 mmol/L (ref 3.5–5.1)
Sodium: 141 mmol/L (ref 135–145)

## 2021-01-17 LAB — GLUCOSE, CAPILLARY
Glucose-Capillary: 124 mg/dL — ABNORMAL HIGH (ref 70–99)
Glucose-Capillary: 133 mg/dL — ABNORMAL HIGH (ref 70–99)
Glucose-Capillary: 345 mg/dL — ABNORMAL HIGH (ref 70–99)
Glucose-Capillary: 260 mg/dL — ABNORMAL HIGH (ref 70–99)
Glucose-Capillary: 173 mg/dL — ABNORMAL HIGH (ref 70–99)
Glucose-Capillary: 93 mg/dL (ref 70–99)

## 2021-01-17 MED ORDER — SODIUM CHLORIDE 0.9 % IV SOLN: INTRAVENOUS | Status: DC

## 2021-01-17 MED ORDER — ALBUMIN HUMAN 25 % IV SOLN
25.0000 g | Freq: Once | INTRAVENOUS | Status: AC
  Administered 2021-01-17: 25 g via INTRAVENOUS
  Filled 2021-01-17: qty 100

## 2021-01-17 NOTE — TOC Progression Note (Signed)
Transition of Care (TOC) - Progression Note    Patient Details  Name: Mark FOFANA Sr. MRN: [id number] Date of Birth: 1941/05/12  Transition [facility]) CM/SW Contact  Ihor Gully, LCSW Phone Number: 01/17/2021, 12:30 PM  Clinical Narrative:    [name] accepts bed at [facility]. Auth will be started when patient is closer to d/c.    Expected Discharge Plan: Skilled Nursing Facility Barriers to Discharge: Continued Medical Work up  Expected Discharge Plan and Services Expected Discharge Plan: [place] Choice: [place] arrangements for the past 2 months: Single Family Home                                       Social Determinants of Health (SDOH) Interventions    Readmission Risk Interventions Readmission Risk Prevention Plan 12/04/2018  Transportation Screening Complete  PCP or Specialist Appt within 3-5 Days (No Data)  [place] or [place] Complete  Social Work Consult for [place] Planning/Counseling Complete  Palliative Care Screening Not Applicable  Medication Review Press photographer) Complete  Some recent data might be hidden

## 2021-01-17 NOTE — Progress Notes (Signed)
PROGRESS NOTE   [name]  MVH:[id number] DOB: 09/03/1940 DOA: 12/23/2020 PCP: [name], MD   Chief Complaint  Patient presents with   Weakness   Level of care: Med-Surg  Brief Admission History:  80 year old gentleman with type 2 diabetes mellitus, stage IIIb CKD, dyslipidemia, hypertension, diastolic congestive heart failure and coronary artery disease presented with increasing weakness and confusion.  He was recently diagnosed with COVID-19 on 12/09/2020.  He was admitted with acute kidney injury.  Assessment & Plan:   Active Problems:   AKI (acute kidney injury) ([place])   Acute renal failure superimposed on stage 3b chronic kidney disease (HCC)   Acute metabolic encephalopathy   COVID-19 virus infection   Metabolic acidosis   SVT (supraventricular tachycardia) (HCC)   Pressure injury of skin   Shingles  AKI on CKD stage IIIb-voiding well, nonoliguric - -Suspect patient had ischemic ATN due to hypotension and dehydration/volume depletion -Started on HD on 12/29/2020 due to azotemia, last HD session was 01/08/2021 -Overall mentation and overall Renal function improved with hemodialysis  left internal jugular HD catheter in place since 12/29/2020--was removed on 01/15/21    Acute shingles outbreak - involving the left foot and leg in a dermatomal pattern. -Valtrex to be renally dosed per pharmacy  continue airborne and contact precautions.  Can dc airborne when lesions have crusted and dried up.   -As of 01/17/2021 patient lesions have not crusted but they are no longer painful   He was advised to avoid touching eyes.  Continue valtrex for at least 7 days.   Acute metabolic encephalopathy-much improved, still has occasional episodes of confusion and hallucinations  Hypocalcemia-being treated with Tums and vitamin D.  Status post calcium gluconate on 01/05/2021.  Anemia in CKD- he was transfused 2 units PRBC on 01/07/2021.  DKA-resolved  Uncontrolled type 2  diabetes mellitus with renal complications-continue SSI coverage and prandial coverage and frequent CBG monitoring. CBG (last 3)  Recent Labs    01/17/21 1130 01/17/21 1409 01/17/21 1606  GLUCAP 345* 260* 173*   Continue novolog to 5 units TID with meals and lantus 10 units daily.   Recent COVID-19 infection- patient status post 3 days of remdesivir for asymptomatic infection.  He has been stable on room air.  He is now over  30 days post diagnosis and no longer in quarantine.  Thrombocytopenia-resolved.  Hypokalemia- repleted.   SVT/A. fib with RVR- Cardizem and Lopressor as ordered by cardiology service.  Increased cardizem to 180 mg per cardio recs. Continue metoprolol 50 mg BID.   Coronary artery disease- no chest pain symptoms at this time.  Continue aspirin and beta-blocker therapy.  Severe  hypomagnesemia - this has been repleted.   Resting tremor - Possibly related to severe low Mg levels, seems to be improving, follow closely.   Will recommend outpatient neurology consultation.   Essential hypertension- stable on current medications.  Hyperlipidemia-resume home statin therapy.  He is on pravastatin 10 mg daily.  Aortic root dilatation-this is being followed outpatient with cardiology.  DVT prophylaxis: SCDs Code Status: Full  Family Communication: wife updated by telephone 6/11 Disposition: SNF  Status is: Inpatient  Remains inpatient appropriate because:IV treatments appropriate due to intensity of illness or inability to take PO and Inpatient level of care appropriate due to severity of illness   Dispo: The patient is from: Home              Anticipated d/c is to: SNF  Patient currently is not medically stable to d/c.   Difficult to place patient No   Consultants:  Cardiology Nephrology   Procedures:    Antimicrobials:   Valtrex 01/14/21>>  Subjective: -Wife at bedside, questions answered, more appropriate, more  coherent,  Objective: Vitals:   01/16/21 1944 01/17/21 0348 01/17/21 1400 01/17/21 1800  BP: (!) 167/81 (!) 155/74 (!) 185/89   Pulse: 84 89 92   Resp: 18 19 20    Temp: 98.1 F (36.7 C) 98.1 F (36.7 C) (!) 100.4 F (38 C) 99.1 F (37.3 C)  TempSrc: Oral  Oral Oral  SpO2: 98% 98% 94%   Weight:      Height:        Intake/Output Summary (Last 24 hours) at 01/17/2021 1830 Last data filed at 01/17/2021 1600 Gross per 24 hour  Intake 1151.55 ml  Output --  Net 1151.55 ml   Filed Weights   01/14/21 0544 01/15/21 0500 01/16/21 0500  Weight: 91.5 kg 89.4 kg 88.6 kg   Examination:  General exam: Chronically ill-appearing male, appears calm and comfortable  Respiratory system: Clear to auscultation. Respiratory effort normal. Cardiovascular system: normal S1 & S2 heard. No JVD, murmurs, rubs, gallops or clicks. No pedal edema. Gastrointestinal system: Abdomen is nondistended, soft and nontender. No organomegaly or masses felt. Normal bowel sounds heard. Central nervous system: Alert and oriented. No focal neurological deficits. Extremities: Symmetric 5 x 5 power. Skin: herpetic blisters seen left lower leg and foot c/w shingles outbreak. Lesions have NOT fully crusted yet.   Psychiatry: Judgement and insight appear poor. Mood & affect flat.   Data Reviewed: I have personally reviewed following labs and imaging studies  CBC: Recent Labs  Lab 01/13/21 0622  WBC 7.3  HGB 9.4*  HCT 30.9*  MCV 98.7  PLT 800    Basic Metabolic Panel: Recent Labs  Lab 01/13/21 0622 01/14/21 0608 01/15/21 0625 01/16/21 0457 01/17/21 0430  NA 140 138 141 142 141  K 3.9 3.8 4.8 3.6 3.8  CL 103 102 102 104 105  CO2 23 24 25 24 23   GLUCOSE 172* 151* 135* 100* 126*  BUN 97* 105* 104* 128* 114*  CREATININE 4.75* 4.67* 5.03* 4.94* 4.80*  CALCIUM 6.7* 6.9* 7.4* 7.3* 7.5*  MG 1.3* 2.0  --   --   --   PHOS 4.7* 4.7* 4.9* 4.7* 4.6    GFR: Estimated Creatinine Clearance: 13.6 mL/min (A) (by  C-G formula based on SCr of 4.8 mg/dL (H)).  Liver Function Tests: Recent Labs  Lab 01/13/21 0622 01/14/21 0608 01/15/21 0625 01/16/21 0457 01/17/21 0430  ALBUMIN 2.0* 2.0* 2.3* 2.2* 2.3*    CBG: Recent Labs  Lab 01/17/21 0339 01/17/21 0734 01/17/21 1130 01/17/21 1409 01/17/21 1606  GLUCAP 124* 133* 345* 260* 173*    No results found for this or any previous visit (from the past 240 hour(s)).    Radiology Studies: No results found.  Scheduled Meds:  sodium chloride   Intravenous Once   alteplase  2 mg Intracatheter Once   alteplase  2 mg Intracatheter Once   aspirin EC  81 mg Oral Daily   calcitRIOL  0.25 mcg Oral Daily   calcium carbonate  800 mg of elemental calcium Oral TID WC   cholecalciferol  2,000 Units Oral Daily   darbepoetin (ARANESP) injection - NON-DIALYSIS  100 mcg Subcutaneous Q Tue-1800   diltiazem  180 mg Oral Daily   insulin aspart  0-9 Units Subcutaneous TID WC  insulin aspart  3 Units Subcutaneous TID WC   insulin glargine  8 Units Subcutaneous QHS   lidocaine  1 patch Transdermal Daily   lipase/protease/amylase  12,000 Units Oral TID AC   mouth rinse  15 mL Mouth Rinse BID   metoprolol tartrate  50 mg Oral BID   pravastatin  10 mg Oral q1800   saccharomyces boulardii  250 mg Oral BID   sodium bicarbonate  1,300 mg Oral BID   valACYclovir  500 mg Oral Daily   Continuous Infusions:  sodium chloride     sodium chloride     sodium chloride 50 mL/hr at 01/17/21 [place], MD How to contact the [facility] Attending or Consulting provider [place] or covering provider during after hours [place], for this patient?  Check the care team in [facility] and look for a) attending/consulting TRH provider listed and b) the [facility] team listed Log into [email or web address] and use [place]'s universal password to access. If you do not have the password, please contact the hospital operator. Locate the [facility] provider you are looking for under [facility]  and page to a number that you can be directly reached. If you still have difficulty reaching the provider, please page the [facility] (Director on Call) for the Hospitalists listed on amion for assistance.  01/17/2021, 6:30 PM

## 2021-01-17 NOTE — Progress Notes (Signed)
Patient ID: Eli Hose., male   DOB: [birth year], 80 y.o.   MRN: [id number] S: More awake and alert this am.  Wife at bedside. O:BP (!) 155/74 (BP Location: Right Arm)   Pulse 89   Temp 98.1 F (36.7 C)   Resp 19   Ht 5' 8.5" (1.74 m)   Wt 88.6 kg   SpO2 98%   BMI 29.27 kg/m   Intake/Output Summary (Last 24 hours) at 01/17/2021 1121 Last data filed at 01/17/2021 0400 Gross per 24 hour  Intake 729.02 ml  Output --  Net 729.02 ml   Intake/Output: I/O last 3 completed shifts: In: 1689 [P.O.:960; I.V.:729] Out: -   Intake/Output this shift:  No intake/output data recorded. Weight change:  WUJ:WJXBJY mentation CVS:RRR Resp: CTA Abd: +Bs, soft, NT/ND Ext: 1+ pitting bilateral lower extremity edema  Recent Labs  Lab 01/11/21 0535 01/12/21 0635 01/13/21 0622 01/14/21 0608 01/15/21 0625 01/16/21 0457 01/17/21 0430  NA 139 139 140 138 141 142 141  K 3.3* 3.5 3.9 3.8 4.8 3.6 3.8  CL 102 104 103 102 102 104 105  CO2 24 24 23 24 25 24 23   GLUCOSE 207* 209* 172* 151* 135* 100* 126*  BUN 84* 89* 97* 105* 104* 128* 114*  CREATININE 4.31* 4.37* 4.75* 4.67* 5.03* 4.94* 4.80*  ALBUMIN 2.0* 1.9* 2.0* 2.0* 2.3* 2.2* 2.3*  CALCIUM 6.7* 6.5* 6.7* 6.9* 7.4* 7.3* 7.5*  PHOS 4.5 4.2 4.7* 4.7* 4.9* 4.7* 4.6   Liver Function Tests: Recent Labs  Lab 01/15/21 0625 01/16/21 0457 01/17/21 0430  ALBUMIN 2.3* 2.2* 2.3*   No results for input(s): LIPASE, AMYLASE in the last 168 hours. No results for input(s): AMMONIA in the last 168 hours. CBC: Recent Labs  Lab 01/13/21 0622  WBC 7.3  HGB 9.4*  HCT 30.9*  MCV 98.7  PLT 199   Cardiac Enzymes: No results for input(s): CKTOTAL, CKMB, CKMBINDEX, TROPONINI in the last 168 hours. CBG: Recent Labs  Lab 01/16/21 1742 01/16/21 1947 01/16/21 2334 01/17/21 0339 01/17/21 0734  GLUCAP 195* 120* 112* 124* 133*    Iron Studies: No results for input(s): IRON, TIBC, TRANSFERRIN, FERRITIN in the last 72  hours. Studies/Results: No results found.  sodium chloride   Intravenous Once   alteplase  2 mg Intracatheter Once   alteplase  2 mg Intracatheter Once   aspirin EC  81 mg Oral Daily   calcitRIOL  0.25 mcg Oral Daily   calcium carbonate  800 mg of elemental calcium Oral TID WC   cholecalciferol  2,000 Units Oral Daily   darbepoetin (ARANESP) injection - NON-DIALYSIS  100 mcg Subcutaneous Q Tue-1800   diltiazem  180 mg Oral Daily   insulin aspart  0-9 Units Subcutaneous TID WC   insulin aspart  3 Units Subcutaneous TID WC   insulin glargine  8 Units Subcutaneous QHS   lidocaine  1 patch Transdermal Daily   lipase/protease/amylase  12,000 Units Oral TID AC   mouth rinse  15 mL Mouth Rinse BID   metoprolol tartrate  50 mg Oral BID   pravastatin  10 mg Oral q1800   saccharomyces boulardii  250 mg Oral BID   sodium bicarbonate  1,300 mg Oral BID   valACYclovir  500 mg Oral Daily    BMET    Component Value Date/Time   NA 141 01/17/2021 0430   K 3.8 01/17/2021 0430   CL 105 01/17/2021 0430   CO2 23 01/17/2021 0430   GLUCOSE 126 (  H) 01/17/2021 0430   BUN 114 (H) 01/17/2021 0430   CREATININE 4.80 (H) 01/17/2021 0430   CREATININE 1.77 (H) 12/30/2019 1143   CALCIUM 7.5 (L) 01/17/2021 0430   GFRNONAA 12 (L) 01/17/2021 0430   GFRNONAA 36 (L) 12/30/2019 1143   GFRAA 42 (L) 12/30/2019 1143   CBC    Component Value Date/Time   WBC 7.3 01/13/2021 0622   RBC 3.13 (L) 01/13/2021 0622   HGB 9.4 (L) 01/13/2021 0622   HCT 30.9 (L) 01/13/2021 0622   PLT 199 01/13/2021 0622   MCV 98.7 01/13/2021 0622   MCH 30.0 01/13/2021 0622   MCHC 30.4 01/13/2021 0622   RDW 14.6 01/13/2021 0622   LYMPHSABS 0.8 12/23/2020 1153   MONOABS 0.6 12/23/2020 1153   EOSABS 0.1 12/23/2020 1153   BASOSABS 0.0 12/23/2020 1153    Assessment/Plan:   AKI/CKD stage IIIb, non-oliguric - presumably ischemic ATN in setting of volume depletion, hypotension, and covid-19 infection.  Required HD on 12/29/20 due to  azotemia and last HD session 01/08/21.  HD catheter in place for 2 weeks.  Will remove HD catheter and continue to follow UOP and BUN/Cr.  Appears to have reached a plateau. Scr improving but BUN had been climbing since last HD. Started gentle IVF's as he appears pre-renal with improvement of both BUN/Cr overnight.  Will also give IV albumin to help intravascular volume.  Will cont IVF's and follow. If he requires ongoing dialysis he will need a tunneled HD catheter to be placed and this was discussed with his wife.  Unfortunately he did not tolerate his last session of dialysis well due to tachycardia and hypotension. Acute metabolic encephalopathy - fluctuates day to day.  Did not improve with dialysis so uremia unlikely. HTN - stable Anemia of acute illness - stable Hgb.  On ESA Shingles outbreak - on left leg. Hypocalcemia - improved with supplementation. iPTH 256, will start calcitriol and follow H/o covid-19 infection - no longer on isolation protocol for covid but is for shingles outbreak. Disposition - awaiting SNF placement when medically stable.   Donetta Potts, MD [employer] [phone]

## 2021-01-17 NOTE — Care Management Important Message (Signed)
Important Message  Patient Details  Name: Mark OHM Sr. MRN: [id number] Date of Birth: Mar 03, 1941   Medicare Important Message Given:  Yes     Tommy Medal 01/17/2021, 1:20 PM

## 2021-01-17 NOTE — Plan of Care (Signed)
  Problem: Skin Integrity: Goal: Risk for impaired skin integrity will decrease Outcome: Progressing   Problem: Nutritional: Goal: Ability to make healthy dietary choices will improve Outcome: Progressing

## 2021-01-18 DIAGNOSIS — E872 Acidosis: Secondary | ICD-10-CM | POA: Diagnosis not present

## 2021-01-18 DIAGNOSIS — G9341 Metabolic encephalopathy: Secondary | ICD-10-CM | POA: Diagnosis not present

## 2021-01-18 DIAGNOSIS — N170 Acute kidney failure with tubular necrosis: Secondary | ICD-10-CM | POA: Diagnosis not present

## 2021-01-18 DIAGNOSIS — U071 COVID-19: Secondary | ICD-10-CM | POA: Diagnosis not present

## 2021-01-18 LAB — GLUCOSE, CAPILLARY
Glucose-Capillary: 98 mg/dL (ref 70–99)
Glucose-Capillary: 97 mg/dL (ref 70–99)
Glucose-Capillary: 246 mg/dL — ABNORMAL HIGH (ref 70–99)
Glucose-Capillary: 235 mg/dL — ABNORMAL HIGH (ref 70–99)
Glucose-Capillary: 186 mg/dL — ABNORMAL HIGH (ref 70–99)

## 2021-01-18 LAB — RENAL FUNCTION PANEL
Albumin: 2.2 g/dL — ABNORMAL LOW (ref 3.5–5.0)
Anion gap: 11 (ref 5–15)
BUN: 112 mg/dL — ABNORMAL HIGH (ref 8–23)
CO2: 25 mmol/L (ref 22–32)
Calcium: 7.2 mg/dL — ABNORMAL LOW (ref 8.9–10.3)
Chloride: 107 mmol/L (ref 98–111)
Creatinine, Ser: 4.9 mg/dL — ABNORMAL HIGH (ref 0.61–1.24)
GFR, Estimated: 11 mL/min — ABNORMAL LOW (ref >60)
Glucose, Bld: 98 mg/dL (ref 70–99)
Phosphorus: 5.1 mg/dL — ABNORMAL HIGH (ref 2.5–4.6)
Potassium: 3 mmol/L — ABNORMAL LOW (ref 3.5–5.1)
Sodium: 143 mmol/L (ref 135–145)

## 2021-01-18 LAB — OCCULT BLOOD X 1 CARD TO LAB, STOOL: Fecal Occult Bld: NEGATIVE

## 2021-01-18 MED ORDER — CHLORHEXIDINE GLUCONATE CLOTH 2 % EX PADS: 6.0000 | MEDICATED_PAD | Freq: Every day | CUTANEOUS | Status: DC

## 2021-01-18 MED ORDER — POTASSIUM CHLORIDE CRYS ER 20 MEQ PO TBCR
40.0000 meq | EXTENDED_RELEASE_TABLET | ORAL | Status: DC
  Administered 2021-01-18: 40 meq via ORAL
  Filled 2021-01-18: qty 2

## 2021-01-18 NOTE — Progress Notes (Signed)
[facility]  Will do a tunneled dialysis catheter on Monday. NPO midnight Sunday and will see on Monday.  Curlene Labrum, MD [facility] [address] [place],  [address] [phone] (office)

## 2021-01-18 NOTE — Progress Notes (Signed)
PROGRESS NOTE   [name]  WIO:[id number] DOB: 10/08/1940 DOA: 12/23/2020 PCP: [name], MD   Chief Complaint  Patient presents with   Weakness   Level of care: Med-Surg  Brief Admission History:  80 year old gentleman with type 2 diabetes mellitus, stage IIIb CKD, dyslipidemia, hypertension, diastolic congestive heart failure and coronary artery disease presented with increasing weakness and confusion.  He was recently diagnosed with COVID-19 on 12/09/2020.  He was admitted with acute kidney injury.  Assessment & Plan:   Active Problems:   AKI (acute kidney injury) ([place])   Acute renal failure superimposed on stage 3b chronic kidney disease (HCC)   Acute metabolic encephalopathy   COVID-19 virus infection   Metabolic acidosis   SVT (supraventricular tachycardia) (HCC)   Pressure injury of skin   Shingles  AKI on CKD stage IIIb-voiding well, nonoliguric - -Suspect patient had ischemic ATN due to hypotension and dehydration/volume depletion -Started on HD on 12/29/2020 due to azotemia, last HD session was 01/08/2021 -Overall mentation and overall Renal function improved with hemodialysis  left internal jugular HD catheter in place since 12/29/2020--was removed on 01/15/21 -Nephrologist is contemplating possibly putting a tunneled catheter and restarting hemodialysis given ongoing episodes of confusion and rising BUN  Acute shingles outbreak - involving the left foot and leg in a dermatomal pattern. -Valtrex to be renally dosed per pharmacy  continue airborne and contact precautions.  Can dc airborne when lesions have crusted and dried up.   -As of 01/17/2021 patient lesions have not crusted but they are no longer painful   He was advised to avoid touching eyes.  Continue valtrex for at least 7 days.   Acute metabolic encephalopathy-much improved, still has occasional episodes of confusion and hallucinations  Hypocalcemia-being treated with Tums and vitamin D.  Status  post calcium gluconate on 01/05/2021.  Anemia in CKD- he was transfused 2 units PRBC on 01/07/2021.  DKA-resolved  Uncontrolled type 2 diabetes mellitus with renal complications-continue SSI coverage and prandial coverage and frequent CBG monitoring. CBG (last 3)  Recent Labs    01/18/21 0716 01/18/21 1114 01/18/21 1609  GLUCAP 97 246* 235*   Continue novolog to 5 units TID with meals and lantus 10 units daily.   Recent COVID-19 infection- patient status post 3 days of remdesivir for asymptomatic infection.  He has been stable on room air.  He is now over  30 days post diagnosis and no longer in quarantine.  Thrombocytopenia-resolved.  Hypokalemia- repleted.   SVT/A. fib with RVR- Cardizem and Lopressor as ordered by cardiology service.  Increased cardizem to 180 mg per cardio recs. Continue metoprolol 50 mg BID.   Coronary artery disease- no chest pain symptoms at this time.  Continue aspirin and beta-blocker therapy.  Severe  hypomagnesemia - this has been repleted.   Resting tremor - Possibly related to severe low Mg levels, seems to be improving, follow closely.   Will recommend outpatient neurology consultation.   Essential hypertension- stable on current medications.  Hyperlipidemia-resume home statin therapy.  He is on pravastatin 10 mg daily.  Aortic root dilatation-this is being followed outpatient with cardiology.  DVT prophylaxis: SCDs Code Status: Full  Family Communication: wife updated by telephone 6/11 Disposition: SNF  Status is: Inpatient  Remains inpatient appropriate because:IV treatments appropriate due to intensity of illness or inability to take PO and Inpatient level of care appropriate due to severity of illness   Dispo: The patient is from: Home  Anticipated d/c is to: SNF--- apparently SNF will not be able to take patient until his shingles lesions have crusted over              Patient currently is not medically stable to d/c.    Difficult to place patient No   Consultants:  Cardiology Nephrology   Procedures:    Antimicrobials:   Valtrex 01/14/21>>  Subjective: Occasional episodes of confusion and disorientation persist No fever  Or chills   Objective: Vitals:   01/18/21 0308 01/18/21 0500 01/18/21 0901 01/18/21 1429  BP: 125/70  122/63 117/66  Pulse: 76  92 78  Resp: 16     Temp: (!) 97.5 F (36.4 C)   98.1 F (36.7 C)  TempSrc: Oral   Oral  SpO2: 99%   98%  Weight:  87.7 kg    Height:        Intake/Output Summary (Last 24 hours) at 01/18/2021 1853 Last data filed at 01/18/2021 1700 Gross per 24 hour  Intake 840 ml  Output 1300 ml  Net -460 ml   Filed Weights   01/15/21 0500 01/16/21 0500 01/18/21 0500  Weight: 89.4 kg 88.6 kg 87.7 kg   Examination:  General exam: Chronically ill-appearing male, appears calm and comfortable  Respiratory system: Clear to auscultation. Respiratory effort normal. Cardiovascular system: normal S1 & S2 heard. No JVD, murmurs, rubs, gallops or clicks. No pedal edema. Gastrointestinal system: Abdomen is nondistended, soft and nontender. No organomegaly or masses felt. Normal bowel sounds heard. Central nervous system: Alert and oriented. No focal neurological deficits. Extremities: Symmetric 5 x 5 power. Skin: herpetic blisters seen left lower leg and foot c/w shingles outbreak. Lesions have NOT fully crusted yet.   Psychiatry: Judgement and insight appear poor. Mood & affect flat.   Data Reviewed: I have personally reviewed following labs and imaging studies  CBC: Recent Labs  Lab 01/13/21 0622  WBC 7.3  HGB 9.4*  HCT 30.9*  MCV 98.7  PLT 833    Basic Metabolic Panel: Recent Labs  Lab 01/13/21 0622 01/14/21 0608 01/15/21 0625 01/16/21 0457 01/17/21 0430 01/18/21 0527  NA 140 138 141 142 141 143  K 3.9 3.8 4.8 3.6 3.8 3.0*  CL 103 102 102 104 105 107  CO2 23 24 25 24 23 25   GLUCOSE 172* 151* 135* 100* 126* 98  BUN 97* 105* 104* 128* 114*  112*  CREATININE 4.75* 4.67* 5.03* 4.94* 4.80* 4.90*  CALCIUM 6.7* 6.9* 7.4* 7.3* 7.5* 7.2*  MG 1.3* 2.0  --   --   --   --   PHOS 4.7* 4.7* 4.9* 4.7* 4.6 5.1*    GFR: Estimated Creatinine Clearance: 13.3 mL/min (A) (by C-G formula based on SCr of 4.9 mg/dL (H)).  Liver Function Tests: Recent Labs  Lab 01/14/21 0608 01/15/21 0625 01/16/21 0457 01/17/21 0430 01/18/21 0527  ALBUMIN 2.0* 2.3* 2.2* 2.3* 2.2*    CBG: Recent Labs  Lab 01/17/21 2218 01/18/21 0306 01/18/21 0716 01/18/21 1114 01/18/21 1609  GLUCAP 93 98 97 246* 235*    No results found for this or any previous visit (from the past 240 hour(s)).    Radiology Studies: No results found.  Scheduled Meds:  sodium chloride   Intravenous Once   alteplase  2 mg Intracatheter Once   alteplase  2 mg Intracatheter Once   aspirin EC  81 mg Oral Daily   calcitRIOL  0.25 mcg Oral Daily   calcium carbonate  800 mg of elemental  calcium Oral TID WC   [START ON 01/19/2021] Chlorhexidine Gluconate Cloth  6 each Topical Q0600   cholecalciferol  2,000 Units Oral Daily   darbepoetin (ARANESP) injection - NON-DIALYSIS  100 mcg Subcutaneous Q Tue-1800   diltiazem  180 mg Oral Daily   insulin aspart  0-9 Units Subcutaneous TID WC   insulin aspart  3 Units Subcutaneous TID WC   insulin glargine  8 Units Subcutaneous QHS   lidocaine  1 patch Transdermal Daily   lipase/protease/amylase  12,000 Units Oral TID AC   mouth rinse  15 mL Mouth Rinse BID   metoprolol tartrate  50 mg Oral BID   pravastatin  10 mg Oral q1800   saccharomyces boulardii  250 mg Oral BID   sodium bicarbonate  1,300 mg Oral BID   valACYclovir  500 mg Oral Daily   Continuous Infusions:  sodium chloride     sodium chloride     sodium chloride 50 mL/hr at 01/18/21 1126    Talisa Petrak, MD How to contact the [facility] Attending or Consulting provider [place] or covering provider during after hours [place], for this patient?  Check the care team in [facility] and  look for a) attending/consulting TRH provider listed and b) the [facility] team listed Log into [email or web address] and use [place]'s universal password to access. If you do not have the password, please contact the hospital operator. Locate the [facility] provider you are looking for under [facility] and page to a number that you can be directly reached. If you still have difficulty reaching the provider, please page the [facility] (Director on Call) for the Hospitalists listed on amion for assistance.  01/18/2021, 6:53 PM

## 2021-01-18 NOTE — Progress Notes (Addendum)
Physical Therapy Treatment Patient Details Name: Mark LESINSKI Sr. MRN: [id number] DOB: August 27, 1940 Today's Date: 01/18/2021    History of Present Illness Mark L Straub Sr. is a 80 y.o. male with medical history significant for type 2 diabetes, CKD stage IIIb, hypertension, dyslipidemia, diastolic CHF, and CAD described to the ED via EMS with increasing weakness and confusion along with poor appetite for the last 2 to 3 days.  Symptoms have been present over the past week and patient was diagnosed with COVID-19 infection on May 7.  He saw his PCP on 5/19 for confusion and was not noted to be hypoxemic at that time.  He was told to hold his Lasix and encouraged to drink plenty of fluids, but he really has not been able to drink very much fluids according to the wife at the bedside.  He was started on some mild delirium related to his COVID infection.  Unfortunately, his symptoms have progressed significantly to days.  Patient cannot give any further history at this time given his severe confusion.  Primary historian his wife at bedside.    PT Comments    Patient presents in bed with nasal cannula one with 2 Lpm O2 [place]. Pt was taken off O2 and SpO2 was assessed to be 98%. Patient demonstrated fair bed mobility with verbal cues and fair seated balance at EOB. Patient was able to do therapeutic exercise including: heel raises, toe raises, long arc quads, marching and mini squats. During activity Patients SpO2 remained above 98%.  During transfer patient was more successful with a count down to stand requiring min/mod assistance. During ambulation patient was noted to have more energy with increased gait speed and quality of gait. Patient was able to ambulate with RW for 21 feet with RW. SpO2 remained above 97% during ambulation on room air. Patient was left in chair - nursing staff notified. Patient will benefit from continued physical therapy in hospital and recommended venue below to increase strength,  balance, endurance for safe ADLs and gait.    Follow Up Recommendations  SNF;Supervision - Intermittent;Supervision for mobility/OOB     Equipment Recommendations  None recommended by PT    Recommendations for Other Services       Precautions / Restrictions Precautions Precautions: Fall Restrictions Weight Bearing Restrictions: No    Mobility  Bed Mobility Overal bed mobility: Needs Assistance Bed Mobility: Supine to Sit Rolling: Supervision;Min guard Sidelying to sit: Min assist;Min guard Supine to sit: Min assist     General bed mobility comments: very slow labored unsteady movement requiring use of bed rail, verbal cues on hand placement Patient Response: Cooperative  Transfers Overall transfer level: Needs assistance Equipment used: Rolling walker (2 wheeled) Transfers: Sit to/from [facility] Sit to Stand: Mod assist Stand pivot transfers: Min assist;Min guard       General transfer comment: Patient responds better to acount down then he is able to better generate force for transfer, possibly related to [facility]  Ambulation/Gait Ambulation/Gait assistance: Min assist;Min guard Gait Distance (Feet): 15 Feet Assistive device: Rolling walker (2 wheeled) Gait Pattern/deviations: Decreased step length - right;Decreased step length - left;Decreased stride length Gait velocity: decreased   General Gait Details: labored movement taking side steps at beside, ambulating forward and backwards   Stairs             Wheelchair Mobility    Modified Rankin (Stroke Patients Only)       Balance Overall balance assessment: Needs assistance Sitting-balance support: Feet  supported;No upper extremity supported Sitting balance-Leahy Scale: Fair Sitting balance - Comments: fair/good seated at EOB, had to support self with BUE when completing exercises   Standing balance support: During functional activity;Bilateral upper extremity supported Standing  balance-Leahy Scale: Fair Standing balance comment: using RW                            Cognition Arousal/Alertness: Awake/alert Behavior During Therapy: WFL for tasks assessed/performed Overall Cognitive Status: Within Functional Limits for tasks assessed                                        Exercises General Exercises - Lower Extremity Ankle Circles/Pumps: AROM;Strengthening;Both;Seated;10 reps Long Arc Quad: AROM;Strengthening;Seated;Both;10 reps Hip Flexion/Marching: AROM;Seated;Strengthening;Both;10 reps Toe Raises: AROM;Seated;Strengthening;Both;15 reps Heel Raises: AROM;Seated;Strengthening;Both;15 reps Mini-Sqauts: Standing;AROM;Strengthening;Both;20 reps    General Comments        Pertinent Vitals/Pain Pain Assessment: No/denies pain    Home Living                      Prior Function            PT Goals (current goals can now be found in the care plan section) Acute Rehab PT Goals Patient Stated Goal: return home after rehab PT Goal Formulation: With patient/family Time For Goal Achievement: 01/30/21 Potential to Achieve Goals: Good Progress towards PT goals: Progressing toward goals    Frequency    Min 3X/week      PT Plan Current plan remains appropriate    Co-evaluation              AM-PAC PT "6 Clicks" Mobility   Outcome Measure  Help needed turning from your back to your side while in a flat bed without using bedrails?: A Little Help needed moving from lying on your back to sitting on the side of a flat bed without using bedrails?: A Little Help needed moving to and from a bed to a chair (including a wheelchair)?: A Little Help needed standing up from a chair using your arms (e.g., wheelchair or bedside chair)?: A Lot Help needed to walk in hospital room?: A Little Help needed climbing 3-5 steps with a railing? : A Lot 6 Click Score: 16    End of Session   Activity Tolerance: Patient tolerated  treatment well;Patient limited by fatigue Patient left: in chair;with call bell/phone within reach;with bed alarm set Nurse Communication: Mobility status PT Visit Diagnosis: Unsteadiness on feet (R26.81);Other abnormalities of gait and mobility (R26.89);Muscle weakness (generalized) (M62.81)     Time: 0933-1000 PT Time Calculation (min) (ACUTE ONLY): 27 min  Charges:  $Therapeutic Exercise: 8-22 mins $Therapeutic Activity: 8-22 mins                    2:01 PM, 01/18/21 Aissatou Fronczak Cousler SPT  2:01 PM, 01/18/21 Lonell Grandchild, MPT Physical Therapist with [facility] [phone] office [phone] mobile phone

## 2021-01-18 NOTE — Progress Notes (Signed)
Patient incontinent of stool. Notice red tinged ring around stool. Request order to obtain occult stool. Order received.

## 2021-01-18 NOTE — Progress Notes (Addendum)
Nephrology Progress Note:   Patient ID: [name]., male   DOB: 08-03-1941, 80 y.o.   MRN: [id number]   S:  He had 700 ml uop and one unmeasured void over 6/15. His wife is at bedside. Said mental status comes and goes.   Review of systems Patient denies shortness of breath or chest pain  No n/v  O:BP 122/63   Pulse 92   Temp (!) 97.5 F (36.4 C) (Oral)   Resp 16   Ht 5' 8.5" (1.74 m)   Wt 87.7 kg   SpO2 99%   BMI 28.97 kg/m   Intake/Output Summary (Last 24 hours) at 01/18/2021 1022 Last data filed at 01/18/2021 0900 Gross per 24 hour  Intake 1022.53 ml  Output 700 ml  Net 322.53 ml   Intake/Output: I/O last 3 completed shifts: In: 1631.6 [P.O.:720; I.V.:769.7; IV Piggyback:141.8] Out: 700 [Urine:700]  Intake/Output this shift:  Total I/O In: 120 [P.O.:120] Out: -  Weight change:   Gen elderly male in chair in NAD HENT NCAT CVS:S1S2 no rub Resp: clear and unlabored on room air  Abd: soft, NT/ND Ext: trace to 1+ bilateral lower extremity edema Neuro:slowed mentation. Confused getting hearing aid in   Recent Labs  Lab 01/12/21 0635 01/13/21 0622 01/14/21 0071 01/15/21 0625 01/16/21 0457 01/17/21 0430 01/18/21 0527  NA 139 140 138 141 142 141 143  K 3.5 3.9 3.8 4.8 3.6 3.8 3.0*  CL 104 103 102 102 104 105 107  CO2 24 23 24 25 24 23 25   GLUCOSE 209* 172* 151* 135* 100* 126* 98  BUN 89* 97* 105* 104* 128* 114* 112*  CREATININE 4.37* 4.75* 4.67* 5.03* 4.94* 4.80* 4.90*  ALBUMIN 1.9* 2.0* 2.0* 2.3* 2.2* 2.3* 2.2*  CALCIUM 6.5* 6.7* 6.9* 7.4* 7.3* 7.5* 7.2*  PHOS 4.2 4.7* 4.7* 4.9* 4.7* 4.6 5.1*   Liver Function Tests: Recent Labs  Lab 01/16/21 0457 01/17/21 0430 01/18/21 0527  ALBUMIN 2.2* 2.3* 2.2*   No results for input(s): LIPASE, AMYLASE in the last 168 hours. No results for input(s): AMMONIA in the last 168 hours. CBC: Recent Labs  Lab 01/13/21 0622  WBC 7.3  HGB 9.4*  HCT 30.9*  MCV 98.7  PLT 199   Cardiac Enzymes: No  results for input(s): CKTOTAL, CKMB, CKMBINDEX, TROPONINI in the last 168 hours. CBG: Recent Labs  Lab 01/17/21 1409 01/17/21 1606 01/17/21 2218 01/18/21 0306 01/18/21 0716  GLUCAP 260* 173* 93 98 97    Iron Studies: No results for input(s): IRON, TIBC, TRANSFERRIN, FERRITIN in the last 72 hours. Studies/Results: No results found.  sodium chloride   Intravenous Once   alteplase  2 mg Intracatheter Once   alteplase  2 mg Intracatheter Once   aspirin EC  81 mg Oral Daily   calcitRIOL  0.25 mcg Oral Daily   calcium carbonate  800 mg of elemental calcium Oral TID WC   cholecalciferol  2,000 Units Oral Daily   darbepoetin (ARANESP) injection - NON-DIALYSIS  100 mcg Subcutaneous Q Tue-1800   diltiazem  180 mg Oral Daily   insulin aspart  0-9 Units Subcutaneous TID WC   insulin aspart  3 Units Subcutaneous TID WC   insulin glargine  8 Units Subcutaneous QHS   lidocaine  1 patch Transdermal Daily   lipase/protease/amylase  12,000 Units Oral TID AC   mouth rinse  15 mL Mouth Rinse BID   metoprolol tartrate  50 mg Oral BID   potassium chloride  40 mEq  Oral Q3H   pravastatin  10 mg Oral q1800   saccharomyces boulardii  250 mg Oral BID   sodium bicarbonate  1,300 mg Oral BID   valACYclovir  500 mg Oral Daily    BMET    Component Value Date/Time   NA 143 01/18/2021 0527   K 3.0 (L) 01/18/2021 0527   CL 107 01/18/2021 0527   CO2 25 01/18/2021 0527   GLUCOSE 98 01/18/2021 0527   BUN 112 (H) 01/18/2021 0527   CREATININE 4.90 (H) 01/18/2021 0527   CREATININE 1.77 (H) 12/30/2019 1143   CALCIUM 7.2 (L) 01/18/2021 0527   GFRNONAA 11 (L) 01/18/2021 0527   GFRNONAA 36 (L) 12/30/2019 1143   GFRAA 42 (L) 12/30/2019 1143   CBC    Component Value Date/Time   WBC 7.3 01/13/2021 0622   RBC 3.13 (L) 01/13/2021 0622   HGB 9.4 (L) 01/13/2021 0622   HCT 30.9 (L) 01/13/2021 0622   PLT 199 01/13/2021 0622   MCV 98.7 01/13/2021 0622   MCH 30.0 01/13/2021 0622   MCHC 30.4 01/13/2021 0622    RDW 14.6 01/13/2021 0622   LYMPHSABS 0.8 12/23/2020 1153   MONOABS 0.6 12/23/2020 1153   EOSABS 0.1 12/23/2020 1153   BASOSABS 0.0 12/23/2020 1153    Assessment/Plan:   AKI/CKD stage IIIb, non-oliguric - presumably ischemic ATN in setting of volume depletion, hypotension, and covid-19 infection.  Required HD on 12/29/20 due to azotemia and last HD session 01/08/21.  HD catheter in place for 2 weeks.  HD catheter was removed and continue to follow UOP and BUN/Cr.  Anticipate he will need additional HD NPO after midnight tonight for tunn catheter procedure on 6/17 If he requires ongoing dialysis he will need a tunneled HD catheter to be placed and this was discussed with his wife.  Unfortunately he did not tolerate his last session of dialysis well due to tachycardia and hypotension. His wife is aware Acute metabolic encephalopathy - fluctuates day to day.  Per charting did not improve with dialysis  HTN - controlled Anemia of acute illness - stable Hgb.  On ESA.  Shingles outbreak - on left leg. Per primary  Hypocalcemia - improved with supplementation. iPTH 256, on calcitriol  H/o covid-19 infection - no longer on isolation protocol for covid but is for shingles outbreak. Disposition - awaiting SNF placement when medically stable.   Claudia Desanctis, MD 01/18/2021 10:22 AM   Spoke with surgery.  Dr. Constance Haw is able to place tunneled catheter on 6/20 but not tomorrow.  No emergent need but does need to resume HD.  Defer nontunneled line and plan for HD on 6/20 after HD catheter.  Appreciate surgery  Claudia Desanctis, MD 12:39 PM 01/18/2021

## 2021-01-18 NOTE — TOC Progression Note (Signed)
Transition of Care (TOC) - Progression Note    Patient Details  Name: Mark TAY Sr. MRN: [id number] Date of Birth: [birth year]  Transition of Care [facility]) CM/SW Contact  Natasha Bence, LCSW Phone Number: 01/18/2021, 5:40 PM  Clinical Narrative:    Jackelyn Poling with [facility] reported that they are unable to take patient with Shingles until sores have crusted over. TOC to follow.   Expected Discharge Plan: Skilled Nursing Facility Barriers to Discharge: Continued Medical Work up  Expected Discharge Plan and Services Expected Discharge Plan: [place] [address] Choice: [place] arrangements for the past 2 months: Single Family Home                                       Social Determinants of Health (SDOH) Interventions    Readmission Risk Interventions Readmission Risk Prevention Plan 12/04/2018  Transportation Screening Complete  PCP or Specialist Appt within 3-5 Days (No Data)  [place] or [place] Complete  Social Work Consult for [place] Planning/Counseling Complete  Palliative Care Screening Not Applicable  Medication Review [employer]) Complete  Some recent data might be hidden

## 2021-01-19 DIAGNOSIS — G9341 Metabolic encephalopathy: Secondary | ICD-10-CM | POA: Diagnosis not present

## 2021-01-19 DIAGNOSIS — E872 Acidosis: Secondary | ICD-10-CM | POA: Diagnosis not present

## 2021-01-19 DIAGNOSIS — U071 COVID-19: Secondary | ICD-10-CM | POA: Diagnosis not present

## 2021-01-19 LAB — GLUCOSE, CAPILLARY
Glucose-Capillary: 178 mg/dL — ABNORMAL HIGH (ref 70–99)
Glucose-Capillary: 180 mg/dL — ABNORMAL HIGH (ref 70–99)
Glucose-Capillary: 307 mg/dL — ABNORMAL HIGH (ref 70–99)
Glucose-Capillary: 167 mg/dL — ABNORMAL HIGH (ref 70–99)
Glucose-Capillary: 160 mg/dL — ABNORMAL HIGH (ref 70–99)

## 2021-01-19 LAB — RENAL FUNCTION PANEL
Albumin: 2.1 g/dL — ABNORMAL LOW (ref 3.5–5.0)
Anion gap: 12 (ref 5–15)
BUN: 110 mg/dL — ABNORMAL HIGH (ref 8–23)
CO2: 22 mmol/L (ref 22–32)
Calcium: 7.2 mg/dL — ABNORMAL LOW (ref 8.9–10.3)
Chloride: 106 mmol/L (ref 98–111)
Creatinine, Ser: 4.94 mg/dL — ABNORMAL HIGH (ref 0.61–1.24)
GFR, Estimated: 11 mL/min — ABNORMAL LOW (ref >60)
Glucose, Bld: 148 mg/dL — ABNORMAL HIGH (ref 70–99)
Phosphorus: 4.2 mg/dL (ref 2.5–4.6)
Potassium: 3.5 mmol/L (ref 3.5–5.1)
Sodium: 140 mmol/L (ref 135–145)

## 2021-01-19 MED ORDER — INSULIN ASPART 100 UNIT/ML IJ SOLN
5.0000 [IU] | Freq: Three times a day (TID) | INTRAMUSCULAR | Status: DC
  Administered 2021-01-20 – 2021-01-25 (×14): 5 [IU] via SUBCUTANEOUS

## 2021-01-19 MED ORDER — CHLORHEXIDINE GLUCONATE CLOTH 2 % EX PADS
6.0000 | MEDICATED_PAD | Freq: Every day | CUTANEOUS | Status: DC
  Administered 2021-01-19 – 2021-01-24 (×6): 6 via TOPICAL

## 2021-01-19 MED ORDER — VALACYCLOVIR HCL 500 MG PO TABS
500.0000 mg | ORAL_TABLET | Freq: Once | ORAL | Status: AC
  Administered 2021-01-19: 500 mg via ORAL
  Filled 2021-01-19: qty 1

## 2021-01-19 MED ORDER — VALACYCLOVIR HCL 500 MG PO TABS
1000.0000 mg | ORAL_TABLET | Freq: Every day | ORAL | Status: AC
  Administered 2021-01-20: 1000 mg via ORAL
  Filled 2021-01-19: qty 2

## 2021-01-19 NOTE — Progress Notes (Signed)
PROGRESS NOTE   [name]  VFI:[id number] DOB: 1940/09/26 DOA: 12/23/2020 PCP: [name], MD   Chief Complaint  Patient presents with   Weakness   Level of care: Med-Surg  Brief Admission History:  80 year old gentleman with type 2 diabetes mellitus, stage IIIb CKD, dyslipidemia, hypertension, diastolic congestive heart failure and coronary artery disease presented with increasing weakness and confusion.  He was recently diagnosed with COVID-19 on 12/09/2020.  He was admitted with acute kidney injury.  Assessment & Plan:   Active Problems:   AKI (acute kidney injury) ([place])   Acute renal failure superimposed on stage 3b chronic kidney disease (HCC)   Acute metabolic encephalopathy   COVID-19 virus infection   Metabolic acidosis   SVT (supraventricular tachycardia) (HCC)   Pressure injury of skin   Shingles  AKI on CKD stage IIIb-voiding well, nonoliguric - -Suspect patient had ischemic ATN due to hypotension and dehydration/volume depletion -Started on HD on 12/29/2020 due to azotemia, last HD session was 01/08/2021 -Overall mentation and overall Renal function improved with hemodialysis  left internal jugular HD catheter in place since 12/29/2020--was removed on 01/15/21 -As per nephrologist plan is for placement of tunneled catheter and restarting hemodialysis on 01/22/21 given ongoing episodes of confusion and rising BUN  Acute shingles outbreak - involving the left foot and leg in a dermatomal pattern. -Valtrex to be renally dosed per pharmacy  continue airborne and contact precautions.  Can dc airborne when lesions have crusted and dried up.   -As of 01/17/2021 patient lesions have not crusted but they are no longer painful   He was advised to avoid touching eyes.  Continue valtrex for at least 7 days, last dose 01/20/2021.   Acute metabolic encephalopathy-much improved, still has occasional episodes of confusion and hallucinations  Hypocalcemia- c/n Tums and  vitamin D.  Status post calcium gluconate on 01/05/2021.  Anemia in CKD- he was transfused 2 units PRBC on 01/07/2021.  DKA-resolved  Uncontrolled type 2 diabetes mellitus with renal complications-continue Lantus insulin 8 units nightly, prandial insulin coverage adjusted  CBG (last 3)  Recent Labs    01/19/21 0802 01/19/21 1158 01/19/21 1712  GLUCAP 180* 307* 167*   Continue novolog to 5 units TID with meals and lantus 10 units daily.   Recent COVID-19 infection- patient status post 3 days of remdesivir for asymptomatic infection.  He has been stable on room air.  He is now over  30 days post diagnosis and no longer in quarantine.  Thrombocytopenia-resolved.  Hypokalemia- repleted.   SVT/A. fib with RVR- Cardizem and Lopressor as ordered by cardiology service.  Increased cardizem to 180 mg per cardio recs. Continue metoprolol 50 mg BID.   Coronary artery disease- no chest pain symptoms at this time.  Continue aspirin and beta-blocker therapy.  Severe  hypomagnesemia - this has been repleted.   Resting tremor - Possibly related to severe low Mg levels, seems to be improving, follow closely.   Will recommend outpatient neurology consultation.   Essential hypertension- stable on current medications.  Hyperlipidemia-resume home statin therapy.  He is on pravastatin 10 mg daily.  Aortic root dilatation-this is being followed outpatient with cardiology.  DVT prophylaxis: SCDs Code Status: Full  Family Communication: wife updated by telephone 6/11 Disposition: SNF  Status is: Inpatient  Remains inpatient appropriate because:IV treatments appropriate due to intensity of illness or inability to take PO and Inpatient level of care appropriate due to severity of illness   Dispo: The patient is  from: Home              Anticipated d/c is to: SNF--- apparently SNF will not be able to take patient until his shingles lesions have crusted over              Patient currently is not  medically stable to d/c.   Difficult to place patient No   Consultants:  Cardiology Nephrology   Procedures:    Antimicrobials:   Valtrex 01/14/21>>  Subjective: -Wife at bedside, more cooperative more coherent this morning, oral intake is fair today, had BM  Objective: Vitals:   01/18/21 2136 01/19/21 0619 01/19/21 0824 01/19/21 1342  BP: 140/67 (!) 157/79 (!) 144/80 125/64  Pulse: 81 (!) 105 (!) 105 95  Resp: (!) 22 18  17   Temp: 98.9 F (37.2 C) 97.6 F (36.4 C) 98 F (36.7 C) 98.8 F (37.1 C)  TempSrc: Oral   Oral  SpO2: 98% 99% 98% 97%  Weight:  86.9 kg    Height:        Intake/Output Summary (Last 24 hours) at 01/19/2021 1815 Last data filed at 01/19/2021 1300 Gross per 24 hour  Intake 720 ml  Output 2600 ml  Net -1880 ml   Filed Weights   01/16/21 0500 01/18/21 0500 01/19/21 0619  Weight: 88.6 kg 87.7 kg 86.9 kg   Examination:  General exam: Chronically ill-appearing male, appears calm and comfortable  Respiratory system: Clear to auscultation. Respiratory effort normal. Cardiovascular system: normal S1 & S2 heard. No JVD, murmurs, rubs, gallops or clicks. No pedal edema. Gastrointestinal system: Abdomen is nondistended, soft and nontender. No organomegaly or masses felt. Normal bowel sounds heard. Central nervous system: Alert and oriented. No focal neurological deficits. Extremities: Symmetric 5 x 5 power. Skin: herpetic blisters seen left lower leg and foot c/w shingles outbreak. Lesions have NOT fully crusted yet.   Psychiatry: Cognitive and memory concerns, episodes of confusion and disorientation with hallucinations/psychosis from time to time  Data Reviewed: I have personally reviewed following labs and imaging studies  CBC: Recent Labs  Lab 01/13/21 0622  WBC 7.3  HGB 9.4*  HCT 30.9*  MCV 98.7  PLT 378    Basic Metabolic Panel: Recent Labs  Lab 01/13/21 0622 01/14/21 0608 01/15/21 0625 01/16/21 0457 01/17/21 0430 01/18/21 0527  01/19/21 0559  NA 140 138 141 142 141 143 140  K 3.9 3.8 4.8 3.6 3.8 3.0* 3.5  CL 103 102 102 104 105 107 106  CO2 23 24 25 24 23 25 22   GLUCOSE 172* 151* 135* 100* 126* 98 148*  BUN 97* 105* 104* 128* 114* 112* 110*  CREATININE 4.75* 4.67* 5.03* 4.94* 4.80* 4.90* 4.94*  CALCIUM 6.7* 6.9* 7.4* 7.3* 7.5* 7.2* 7.2*  MG 1.3* 2.0  --   --   --   --   --   PHOS 4.7* 4.7* 4.9* 4.7* 4.6 5.1* 4.2    GFR: Estimated Creatinine Clearance: 13.1 mL/min (A) (by C-G formula based on SCr of 4.94 mg/dL (H)).  Liver Function Tests: Recent Labs  Lab 01/15/21 0625 01/16/21 0457 01/17/21 0430 01/18/21 0527 01/19/21 0559  ALBUMIN 2.3* 2.2* 2.3* 2.2* 2.1*    CBG: Recent Labs  Lab 01/18/21 2111 01/19/21 0325 01/19/21 0802 01/19/21 1158 01/19/21 1712  GLUCAP 186* 178* 180* 307* 167*    No results found for this or any previous visit (from the past 240 hour(s)).    Radiology Studies: No results found.  Scheduled Meds:  sodium  chloride   Intravenous Once   alteplase  2 mg Intracatheter Once   alteplase  2 mg Intracatheter Once   aspirin EC  81 mg Oral Daily   calcitRIOL  0.25 mcg Oral Daily   calcium carbonate  800 mg of elemental calcium Oral TID WC   Chlorhexidine Gluconate Cloth  6 each Topical Q0600   cholecalciferol  2,000 Units Oral Daily   darbepoetin (ARANESP) injection - NON-DIALYSIS  100 mcg Subcutaneous Q Tue-1800   diltiazem  180 mg Oral Daily   insulin aspart  0-9 Units Subcutaneous TID WC   [START ON 01/20/2021] insulin aspart  5 Units Subcutaneous TID WC   insulin glargine  8 Units Subcutaneous QHS   lidocaine  1 patch Transdermal Daily   lipase/protease/amylase  12,000 Units Oral TID AC   mouth rinse  15 mL Mouth Rinse BID   metoprolol tartrate  50 mg Oral BID   pravastatin  10 mg Oral q1800   saccharomyces boulardii  250 mg Oral BID   sodium bicarbonate  1,300 mg Oral BID   [START ON 01/20/2021] valACYclovir  1,000 mg Oral Daily   Continuous Infusions:  sodium  chloride     sodium chloride     sodium chloride 50 mL/hr at 01/19/21 1110    Myeesha Shane, MD How to contact the [facility] Attending or Consulting provider [place] or covering provider during after hours [place], for this patient?  Check the care team in [facility] and look for a) attending/consulting TRH provider listed and b) the [facility] team listed Log into [email or web address] and use [facility] universal password to access. If you do not have the password, please contact the hospital operator. Locate the [facility] provider you are looking for under [facility] and page to a number that you can be directly reached. If you still have difficulty reaching the provider, please page the [facility] (Director on Call) for the Hospitalists listed on amion for assistance.  01/19/2021, 6:15 PM

## 2021-01-19 NOTE — Progress Notes (Signed)
Inpatient Diabetes Program Recommendations  AACE/ADA: New Consensus Statement on Inpatient Glycemic Control (2015)  Target Ranges:  Prepandial:   less than 140 mg/dL      Peak postprandial:   less than 180 mg/dL (1-2 hours)      Critically ill patients:  140 - 180 mg/dL   Lab Results  Component Value Date   GLUCAP 307 (H) 01/19/2021   HGBA1C 7.0 (H) 12/23/2020    Review of Glycemic Control  Diabetes history: DM2 Outpatient Diabetes medications: Novolin R 8-12 units TID before meals Current orders for Inpatient glycemic control: Lantus 8 units QHS, Novolog 0-9 units TID with meals + 3 units TID for meal coverage  Post-prandials still above goal.  Inpatient Diabetes Program Recommendations:    Increase Novolog to 5 units TID if eating > 50% meals.  Follow closely.  Thank you. Lorenda Peck, RD, LDN, CDE Inpatient Diabetes Coordinator [phone]

## 2021-01-19 NOTE — Progress Notes (Signed)
Nephrology Progress Note:   Patient ID: Mark Hose., male   DOB: 1941/07/08, 80 y.o.   MRN: [id number]   S:  He had  3.7 liters uop over 6/16 charted.  He feels well.  Spoke with his wife at bedside.  He has been mentating well today  Review of systems  Patient denies shortness of breath or chest pain  No n/v  O:BP (!) 144/80   Pulse (!) 105   Temp 98 F (36.7 C)   Resp 18   Ht 5' 8.5" (1.74 m)   Wt 86.9 kg   SpO2 98%   BMI 28.71 kg/m   Intake/Output Summary (Last 24 hours) at 01/19/2021 1039 Last data filed at 01/19/2021 0900 Gross per 24 hour  Intake 960 ml  Output 3200 ml  Net -2240 ml   Intake/Output: I/O last 3 completed shifts: In: 840 [P.O.:840] Out: 4400 [Urine:4400]  Intake/Output this shift:  Total I/O In: 480 [P.O.:480] Out: -  Weight change: -0.8 kg  Gen elderly male in chair in NAD  HENT NCAT CVS:S1S2 no rub Resp: clear and unlabored on room air  Abd: soft, NT/ND Ext: trace to 1+ bilateral lower extremity edema Psych no anxiety or agitation  Neuro: alert and oriented to person and year. Answers questions more easily today  GU no foley  Recent Labs  Lab 01/13/21 0622 01/14/21 0608 01/15/21 0625 01/16/21 0457 01/17/21 0430 01/18/21 0527 01/19/21 0559  NA 140 138 141 142 141 143 140  K 3.9 3.8 4.8 3.6 3.8 3.0* 3.5  CL 103 102 102 104 105 107 106  CO2 23 24 25 24 23 25 22   GLUCOSE 172* 151* 135* 100* 126* 98 148*  BUN 97* 105* 104* 128* 114* 112* 110*  CREATININE 4.75* 4.67* 5.03* 4.94* 4.80* 4.90* 4.94*  ALBUMIN 2.0* 2.0* 2.3* 2.2* 2.3* 2.2* 2.1*  CALCIUM 6.7* 6.9* 7.4* 7.3* 7.5* 7.2* 7.2*  PHOS 4.7* 4.7* 4.9* 4.7* 4.6 5.1* 4.2   Liver Function Tests: Recent Labs  Lab 01/17/21 0430 01/18/21 0527 01/19/21 0559  ALBUMIN 2.3* 2.2* 2.1*   No results for input(s): LIPASE, AMYLASE in the last 168 hours. No results for input(s): AMMONIA in the last 168 hours. CBC: Recent Labs  Lab 01/13/21 0622  WBC 7.3  HGB 9.4*  HCT 30.9*   MCV 98.7  PLT 199   Cardiac Enzymes: No results for input(s): CKTOTAL, CKMB, CKMBINDEX, TROPONINI in the last 168 hours. CBG: Recent Labs  Lab 01/18/21 1114 01/18/21 1609 01/18/21 2111 01/19/21 0325 01/19/21 0802  GLUCAP 246* 235* 186* 178* 180*    Iron Studies: No results for input(s): IRON, TIBC, TRANSFERRIN, FERRITIN in the last 72 hours. Studies/Results: No results found.  sodium chloride   Intravenous Once   alteplase  2 mg Intracatheter Once   alteplase  2 mg Intracatheter Once   aspirin EC  81 mg Oral Daily   calcitRIOL  0.25 mcg Oral Daily   calcium carbonate  800 mg of elemental calcium Oral TID WC   cholecalciferol  2,000 Units Oral Daily   darbepoetin (ARANESP) injection - NON-DIALYSIS  100 mcg Subcutaneous Q Tue-1800   diltiazem  180 mg Oral Daily   insulin aspart  0-9 Units Subcutaneous TID WC   insulin aspart  3 Units Subcutaneous TID WC   insulin glargine  8 Units Subcutaneous QHS   lidocaine  1 patch Transdermal Daily   lipase/protease/amylase  12,000 Units Oral TID AC   mouth rinse  15 mL  Mouth Rinse BID   metoprolol tartrate  50 mg Oral BID   pravastatin  10 mg Oral q1800   saccharomyces boulardii  250 mg Oral BID   sodium bicarbonate  1,300 mg Oral BID   valACYclovir  500 mg Oral Daily    BMET    Component Value Date/Time   NA 140 01/19/2021 0559   K 3.5 01/19/2021 0559   CL 106 01/19/2021 0559   CO2 22 01/19/2021 0559   GLUCOSE 148 (H) 01/19/2021 0559   BUN 110 (H) 01/19/2021 0559   CREATININE 4.94 (H) 01/19/2021 0559   CREATININE 1.77 (H) 12/30/2019 1143   CALCIUM 7.2 (L) 01/19/2021 0559   GFRNONAA 11 (L) 01/19/2021 0559   GFRNONAA 36 (L) 12/30/2019 1143   GFRAA 42 (L) 12/30/2019 1143   CBC    Component Value Date/Time   WBC 7.3 01/13/2021 0622   RBC 3.13 (L) 01/13/2021 0622   HGB 9.4 (L) 01/13/2021 0622   HCT 30.9 (L) 01/13/2021 0622   PLT 199 01/13/2021 0622   MCV 98.7 01/13/2021 0622   MCH 30.0 01/13/2021 0622   MCHC 30.4  01/13/2021 0622   RDW 14.6 01/13/2021 0622   LYMPHSABS 0.8 12/23/2020 1153   MONOABS 0.6 12/23/2020 1153   EOSABS 0.1 12/23/2020 1153   BASOSABS 0.0 12/23/2020 1153    Assessment/Plan:   AKI/CKD stage IIIb, non-oliguric - presumably ischemic ATN in setting of volume depletion, hypotension, and covid-19 infection.  Required HD on 12/29/20 due to azotemia and last HD session 01/08/21.  HD catheter in place for 2 weeks and this was removed for infection control  No emergent need for HD today Plan for tunneled catheter procedure on 6/20 (NPO order is in for 6/20 at 12:01 am) HD after tunneled catheter on 6/20 Note he did not tolerate last HD session due to tachycardia and hypotension - caution with treatments; his wife is aware Acute metabolic encephalopathy - fluctuates day to day.  Per charting did not improve with dialysis  HTN - controlled Anemia of acute illness - stable Hgb.  On ESA.  Shingles outbreak - on left leg. Per primary  Hypocalcemia - improved with supplementation. iPTH 256, on calcitriol  H/o covid-19 infection - no longer on isolation protocol for covid but is for shingles outbreak. Disposition - awaiting SNF placement when medically stable.   Nephrology will review labs over the weekend  Claudia Desanctis, MD 01/19/2021 11:02 AM

## 2021-01-19 NOTE — Care Management Important Message (Signed)
Important Message  Patient Details  Name: Mark SAWA Sr. MRN: [id number] Date of Birth: Dec 12, 1940   Medicare Important Message Given:  Yes     Tommy Medal 01/19/2021, 1:28 PM

## 2021-01-20 DIAGNOSIS — E872 Acidosis: Secondary | ICD-10-CM | POA: Diagnosis not present

## 2021-01-20 DIAGNOSIS — B029 Zoster without complications: Secondary | ICD-10-CM | POA: Diagnosis not present

## 2021-01-20 DIAGNOSIS — U071 COVID-19: Secondary | ICD-10-CM | POA: Diagnosis not present

## 2021-01-20 DIAGNOSIS — G9341 Metabolic encephalopathy: Secondary | ICD-10-CM | POA: Diagnosis not present

## 2021-01-20 LAB — CBC
HCT: 29.4 % — ABNORMAL LOW (ref 39.0–52.0)
Hemoglobin: 8.9 g/dL — ABNORMAL LOW (ref 13.0–17.0)
MCH: 29.3 pg (ref 26.0–34.0)
MCHC: 30.3 g/dL (ref 30.0–36.0)
MCV: 96.7 fL (ref 80.0–100.0)
Platelets: 220 10*3/uL (ref 150–400)
RBC: 3.04 MIL/uL — ABNORMAL LOW (ref 4.22–5.81)
RDW: 15.4 % (ref 11.5–15.5)
WBC: 11.8 10*3/uL — ABNORMAL HIGH (ref 4.0–10.5)
nRBC: 0 % (ref 0.0–0.2)

## 2021-01-20 LAB — RENAL FUNCTION PANEL
Albumin: 2 g/dL — ABNORMAL LOW (ref 3.5–5.0)
Anion gap: 11 (ref 5–15)
BUN: 113 mg/dL — ABNORMAL HIGH (ref 8–23)
CO2: 22 mmol/L (ref 22–32)
Calcium: 7.4 mg/dL — ABNORMAL LOW (ref 8.9–10.3)
Chloride: 107 mmol/L (ref 98–111)
Creatinine, Ser: 4.71 mg/dL — ABNORMAL HIGH (ref 0.61–1.24)
GFR, Estimated: 12 mL/min — ABNORMAL LOW (ref >60)
Glucose, Bld: 155 mg/dL — ABNORMAL HIGH (ref 70–99)
Phosphorus: 3.5 mg/dL (ref 2.5–4.6)
Potassium: 3.7 mmol/L (ref 3.5–5.1)
Sodium: 140 mmol/L (ref 135–145)

## 2021-01-20 LAB — GLUCOSE, CAPILLARY
Glucose-Capillary: 143 mg/dL — ABNORMAL HIGH (ref 70–99)
Glucose-Capillary: 157 mg/dL — ABNORMAL HIGH (ref 70–99)
Glucose-Capillary: 209 mg/dL — ABNORMAL HIGH (ref 70–99)
Glucose-Capillary: 156 mg/dL — ABNORMAL HIGH (ref 70–99)

## 2021-01-20 NOTE — Progress Notes (Signed)
PROGRESS NOTE   [name]  JSE:[id number] DOB: 03-Feb-1941 DOA: 12/23/2020 PCP: [name], MD   Chief Complaint  Patient presents with   Weakness   Level of care: Med-Surg  Brief Admission History:  80 year old gentleman with type 2 diabetes mellitus, stage IIIb CKD, dyslipidemia, hypertension, diastolic congestive heart failure and coronary artery disease presented with increasing weakness and confusion.  He was recently diagnosed with COVID-19 on 12/09/2020.  He was admitted with acute kidney injury.  Assessment & Plan:   Active Problems:   AKI (acute kidney injury) ([place])   Acute renal failure superimposed on stage 3b chronic kidney disease (HCC)   Acute metabolic encephalopathy   COVID-19 virus infection   Metabolic acidosis   SVT (supraventricular tachycardia) (HCC)   Pressure injury of skin   Shingles  AKI on CKD stage IIIb-voiding well, nonoliguric - -Suspect patient had ischemic ATN due to hypotension and dehydration/volume depletion -Started on HD on 12/29/2020 due to azotemia, last HD session was 01/08/2021 -Overall mentation and overall Renal function improved with hemodialysis  left internal jugular HD catheter in place since 12/29/2020--was removed on 01/15/21 -As per nephrologist plan is for placement of tunneled catheter and restarting hemodialysis on 01/22/21 given ongoing episodes of confusion and rising BUN  Acute shingles outbreak - involving the left foot and leg in a dermatomal pattern. -Valtrex to be renally dosed per pharmacy  continue airborne and contact precautions.  Can dc airborne when lesions have crusted and dried up.   -As of 01/17/2021 patient lesions have not crusted but they are no longer painful   He was advised to avoid touching eyes.  Continue valtrex for at least 7 days, last dose 01/20/2021.   Acute metabolic encephalopathy-much improved, still has occasional episodes of confusion and hallucinations  Hypocalcemia- c/n Tums and  vitamin D.  Status post calcium gluconate on 01/05/2021.  Anemia in CKD- he was transfused 2 units PRBC on 01/07/2021.  DKA-resolved  Uncontrolled type 2 diabetes mellitus with renal complications-continue Lantus insulin 8 units nightly, prandial insulin coverage adjusted  CBG (last 3)  Recent Labs    01/20/21 0748 01/20/21 1229 01/20/21 1707  GLUCAP 157* 209* 156*   Continue novolog to 5 units TID with meals and lantus 10 units daily.   Recent COVID-19 infection- patient status post 3 days of remdesivir for asymptomatic infection.  He has been stable on room air.  He is now over  30 days post diagnosis and no longer in quarantine.  Thrombocytopenia-resolved.  Hypokalemia- repleted.   SVT/A. fib with RVR- Cardizem and Lopressor as ordered by cardiology service.  Increased cardizem to 180 mg per cardio recs. Continue metoprolol 50 mg BID.   Coronary artery disease- no chest pain symptoms at this time.  Continue aspirin and beta-blocker therapy.  Severe  hypomagnesemia - this has been repleted.   Resting tremor - Possibly related to severe low Mg levels, seems to be improving, follow closely.   Will recommend outpatient neurology consultation.   Essential hypertension- stable on current medications.  Hyperlipidemia-resume home statin therapy.  He is on pravastatin 10 mg daily.  Aortic root dilatation-this is being followed outpatient with cardiology.  Leukocytosis noted on 01/20/2021, no fevers no evidence of new infection per se  DVT prophylaxis: SCDs Code Status: Full  Family Communication: wife updated by telephone 6/11 Disposition: SNF  Status is: Inpatient  Remains inpatient appropriate because:IV treatments appropriate due to intensity of illness or inability to take PO and Inpatient level  of care appropriate due to severity of illness   Dispo: The patient is from: Home              Anticipated d/c is to: SNF--- apparently SNF will not be able to take patient until his  shingles lesions have crusted over              Patient currently is not medically stable to d/c.   Difficult to place patient No   Consultants:  Cardiology Nephrology   Procedures:    Antimicrobials:   Valtrex 01/14/21>>  Subjective: -Oral intake is fair, voiding okay no fevers leukocytosis noted  Objective: Vitals:   01/19/21 2134 01/20/21 0611 01/20/21 0858 01/20/21 1709  BP: (!) 141/68 (!) 153/75 (!) 161/82 (!) 174/90  Pulse: 71 (!) 103 90 83  Resp: 17 18  18   Temp: 98.4 F (36.9 C) 99.1 F (37.3 C)  99.1 F (37.3 C)  TempSrc:  Oral  Oral  SpO2: 95% 95% 97% 97%  Weight:  91 kg    Height:        Intake/Output Summary (Last 24 hours) at 01/20/2021 1830 Last data filed at 01/20/2021 0830 Gross per 24 hour  Intake 240 ml  Output 1200 ml  Net -960 ml   Filed Weights   01/18/21 0500 01/19/21 0619 01/20/21 0611  Weight: 87.7 kg 86.9 kg 91 kg   Examination:  General exam: Chronically ill-appearing male, appears calm and comfortable  Respiratory system: Clear to auscultation. Respiratory effort normal. Cardiovascular system: normal S1 & S2 heard. No JVD, murmurs, rubs, gallops or clicks. No pedal edema. Gastrointestinal system: Abdomen is nondistended, soft and nontender. No organomegaly or masses felt. Normal bowel sounds heard. Central nervous system: Alert and oriented. No focal neurological deficits. Extremities: Symmetric 5 x 5 power. Skin: herpetic blisters seen left lower leg and foot c/w shingles outbreak. Lesions have NOT fully crusted yet.   Psychiatry: Cognitive and memory concerns, episodes of confusion and disorientation with hallucinations/psychosis from time to time  Data Reviewed: I have personally reviewed following labs and imaging studies  CBC: Recent Labs  Lab 01/20/21 0637  WBC 11.8*  HGB 8.9*  HCT 29.4*  MCV 96.7  PLT 962    Basic Metabolic Panel: Recent Labs  Lab 01/14/21 0608 01/15/21 0625 01/16/21 0457 01/17/21 0430  01/18/21 0527 01/19/21 0559 01/20/21 0637  NA 138   < > 142 141 143 140 140  K 3.8   < > 3.6 3.8 3.0* 3.5 3.7  CL 102   < > 104 105 107 106 107  CO2 24   < > 24 23 25 22 22   GLUCOSE 151*   < > 100* 126* 98 148* 155*  BUN 105*   < > 128* 114* 112* 110* 113*  CREATININE 4.67*   < > 4.94* 4.80* 4.90* 4.94* 4.71*  CALCIUM 6.9*   < > 7.3* 7.5* 7.2* 7.2* 7.4*  MG 2.0  --   --   --   --   --   --   PHOS 4.7*   < > 4.7* 4.6 5.1* 4.2 3.5   < > = values in this interval not displayed.    GFR: Estimated Creatinine Clearance: 14.1 mL/min (A) (by C-G formula based on SCr of 4.71 mg/dL (H)).  Liver Function Tests: Recent Labs  Lab 01/16/21 0457 01/17/21 0430 01/18/21 0527 01/19/21 0559 01/20/21 0637  ALBUMIN 2.2* 2.3* 2.2* 2.1* 2.0*    CBG: Recent Labs  Lab 01/19/21 2133  01/20/21 0257 01/20/21 0748 01/20/21 1229 01/20/21 1707  GLUCAP 160* 143* 157* 209* 156*    No results found for this or any previous visit (from the past 240 hour(s)).    Radiology Studies: No results found.  Scheduled Meds:  sodium chloride   Intravenous Once   alteplase  2 mg Intracatheter Once   alteplase  2 mg Intracatheter Once   aspirin EC  81 mg Oral Daily   calcitRIOL  0.25 mcg Oral Daily   calcium carbonate  800 mg of elemental calcium Oral TID WC   Chlorhexidine Gluconate Cloth  6 each Topical Q0600   cholecalciferol  2,000 Units Oral Daily   darbepoetin (ARANESP) injection - NON-DIALYSIS  100 mcg Subcutaneous Q Tue-1800   diltiazem  180 mg Oral Daily   insulin aspart  0-9 Units Subcutaneous TID WC   insulin aspart  5 Units Subcutaneous TID WC   insulin glargine  8 Units Subcutaneous QHS   lidocaine  1 patch Transdermal Daily   lipase/protease/amylase  12,000 Units Oral TID AC   mouth rinse  15 mL Mouth Rinse BID   metoprolol tartrate  50 mg Oral BID   pravastatin  10 mg Oral q1800   saccharomyces boulardii  250 mg Oral BID   sodium bicarbonate  1,300 mg Oral BID   Continuous  Infusions:  sodium chloride     sodium chloride     sodium chloride 50 mL/hr at 01/20/21 0910    Leslie Jester, MD How to contact the [facility] Attending or Consulting provider [place] or covering provider during after hours [place], for this patient?  Check the care team in [facility] and look for a) attending/consulting TRH provider listed and b) the [facility] team listed Log into [email or web address] and use [place]'s universal password to access. If you do not have the password, please contact the hospital operator. Locate the [facility] provider you are looking for under [facility] and page to a number that you can be directly reached. If you still have difficulty reaching the provider, please page the [facility] (Director on Call) for the Hospitalists listed on amion for assistance.  01/20/2021, 6:30 PM

## 2021-01-21 DIAGNOSIS — N179 Acute kidney failure, unspecified: Secondary | ICD-10-CM | POA: Diagnosis not present

## 2021-01-21 DIAGNOSIS — U071 COVID-19: Secondary | ICD-10-CM | POA: Diagnosis not present

## 2021-01-21 DIAGNOSIS — E872 Acidosis: Secondary | ICD-10-CM | POA: Diagnosis not present

## 2021-01-21 LAB — CBC
HCT: 31.5 % — ABNORMAL LOW (ref 39.0–52.0)
Hemoglobin: 9.5 g/dL — ABNORMAL LOW (ref 13.0–17.0)
MCH: 29.1 pg (ref 26.0–34.0)
MCHC: 30.2 g/dL (ref 30.0–36.0)
MCV: 96.6 fL (ref 80.0–100.0)
Platelets: 205 10*3/uL (ref 150–400)
RBC: 3.26 MIL/uL — ABNORMAL LOW (ref 4.22–5.81)
RDW: 15.2 % (ref 11.5–15.5)
WBC: 12.2 10*3/uL — ABNORMAL HIGH (ref 4.0–10.5)
nRBC: 0 % (ref 0.0–0.2)

## 2021-01-21 LAB — GLUCOSE, CAPILLARY
Glucose-Capillary: 166 mg/dL — ABNORMAL HIGH (ref 70–99)
Glucose-Capillary: 140 mg/dL — ABNORMAL HIGH (ref 70–99)
Glucose-Capillary: 153 mg/dL — ABNORMAL HIGH (ref 70–99)
Glucose-Capillary: 258 mg/dL — ABNORMAL HIGH (ref 70–99)
Glucose-Capillary: 244 mg/dL — ABNORMAL HIGH (ref 70–99)
Glucose-Capillary: 288 mg/dL — ABNORMAL HIGH (ref 70–99)

## 2021-01-21 LAB — RENAL FUNCTION PANEL
Albumin: 2 g/dL — ABNORMAL LOW (ref 3.5–5.0)
Anion gap: 11 (ref 5–15)
BUN: 122 mg/dL — ABNORMAL HIGH (ref 8–23)
CO2: 23 mmol/L (ref 22–32)
Calcium: 7.5 mg/dL — ABNORMAL LOW (ref 8.9–10.3)
Chloride: 107 mmol/L (ref 98–111)
Creatinine, Ser: 4.53 mg/dL — ABNORMAL HIGH (ref 0.61–1.24)
GFR, Estimated: 12 mL/min — ABNORMAL LOW (ref >60)
Glucose, Bld: 169 mg/dL — ABNORMAL HIGH (ref 70–99)
Phosphorus: 3.5 mg/dL (ref 2.5–4.6)
Potassium: 3.7 mmol/L (ref 3.5–5.1)
Sodium: 141 mmol/L (ref 135–145)

## 2021-01-21 MED ORDER — CEFAZOLIN SODIUM-DEXTROSE 2-4 GM/100ML-% IV SOLN
2.0000 g | INTRAVENOUS | Status: AC
  Administered 2021-01-22: 2 g via INTRAVENOUS
  Filled 2021-01-21: qty 100

## 2021-01-21 MED ORDER — CHLORHEXIDINE GLUCONATE CLOTH 2 % EX PADS
6.0000 | MEDICATED_PAD | Freq: Once | CUTANEOUS | Status: AC
  Administered 2021-01-21: 6 via TOPICAL

## 2021-01-21 NOTE — Progress Notes (Addendum)
PROGRESS NOTE   [name]  YTK:[id number] DOB: 1941-04-12 DOA: 12/23/2020 PCP: [name], MD   Chief Complaint  Patient presents with   Weakness   Level of care: Med-Surg  Brief Admission History:  80 year old gentleman with type 2 diabetes mellitus, stage IIIb CKD, dyslipidemia, hypertension, diastolic congestive heart failure and coronary artery disease presented with increasing weakness and confusion.  He was recently diagnosed with COVID-19 on 12/09/2020.  He was admitted with acute kidney injury.  Assessment & Plan:   Active Problems:   AKI (acute kidney injury) ([place])   Acute renal failure superimposed on stage 3b chronic kidney disease (HCC)   Acute metabolic encephalopathy   COVID-19 virus infection   Metabolic acidosis   SVT (supraventricular tachycardia) (HCC)   Pressure injury of skin   Shingles  AKI on CKD stage IIIb-voiding well, nonoliguric - -Suspect patient had ischemic ATN due to hypotension and dehydration/volume depletion -Started on HD on 12/29/2020 due to azotemia, last HD session was 01/08/2021 -Overall mentation and overall Renal function improved with hemodialysis  left internal jugular HD catheter in place since 12/29/2020--was removed on 01/15/21 -As per nephrologist plan is for placement of tunneled catheter and restarting hemodialysis on 01/22/21 given ongoing episodes of confusion and rising BUN  Acute shingles outbreak - involving the left foot and leg in a dermatomal pattern. -Valtrex to be renally dosed per pharmacy  continue airborne and contact precautions.  Can dc airborne when lesions have crusted and dried up.   -As of 01/17/2021 patient lesions have not crusted but they are no longer painful   He was advised to avoid touching eyes.  Continue valtrex for at least 7 days, last dose 01/20/2021.   Acute metabolic encephalopathy-much improved, still has occasional episodes of confusion and  hallucinations  Hypocalcemia/hypokalemia/hypomagnesemia-replaced, c/n Tums and vitamin D.  Status post calcium gluconate on 01/05/2021.  Anemia in CKD- he was transfused 2 units PRBC on 01/07/2021.  DKA-resolved  Uncontrolled type 2 diabetes mellitus with renal complications-continue Lantus insulin 8 units nightly, prandial insulin coverage adjusted  CBG (last 3)  Recent Labs    01/21/21 0759 01/21/21 1236 01/21/21 1721  GLUCAP 153* 258* 244*   Continue novolog to 5 units TID with meals and lantus 10 units daily.   Recent COVID-19 infection- patient status post 3 days of remdesivir for asymptomatic infection.  He has been stable on room air.  He is now over  30 days post diagnosis and no longer in quarantine.  Thrombocytopenia-resolved.  SVT/A. fib with RVR- Cardizem and Lopressor as ordered by cardiology service.  Increased cardizem to 180 mg per cardio recs. Continue metoprolol 50 mg BID.   Coronary artery disease- no chest pain symptoms at this time.  Continue aspirin and beta-blocker therapy.  Resting tremor - Possibly related to severe low Mg levels, seems to be improving, follow closely.   Will recommend outpatient neurology consultation.   Essential hypertension- stable on current medications.  Hyperlipidemia-resume home statin therapy.  He is on pravastatin 10 mg daily.  Aortic root dilatation-this is being followed outpatient with cardiology.  Leukocytosis noted on 01/20/2021, no fevers no evidence of new infection per se  DVT prophylaxis: SCDs Code Status: Full  Family Communication: wife updated by telephone 6/11 Disposition: SNF  Status is: Inpatient  Remains inpatient appropriate because:IV treatments appropriate due to intensity of illness or inability to take PO and Inpatient level of care appropriate due to severity of illness   Dispo: The patient is  from: Home              Anticipated d/c is to: SNF--- apparently SNF will not be able to take patient until  his shingles lesions have crusted over              Patient currently is not medically stable to d/c.   Difficult to place patient No   Consultants:  Cardiology Nephrology   Procedures:    Antimicrobials:   Valtrex 01/14/21>>  Subjective: Voiding okay, no abdominal pain or back pain no emesis  Objective: Vitals:   01/20/21 2118 01/21/21 0506 01/21/21 0846 01/21/21 1354  BP: (!) 154/83 (!) 160/91 (!) 165/95 (!) 168/77  Pulse: (!) 101 (!) 102 (!) 105 92  Resp: 18 18  20   Temp: 98.7 F (37.1 C) 98.2 F (36.8 C)  97.7 F (36.5 C)  TempSrc: Oral Oral  Oral  SpO2: 98% 97%  97%  Weight:  91.4 kg    Height:        Intake/Output Summary (Last 24 hours) at 01/21/2021 1921 Last data filed at 01/21/2021 1801 Gross per 24 hour  Intake 3197.73 ml  Output 1750 ml  Net 1447.73 ml   Filed Weights   01/19/21 0619 01/20/21 0611 01/21/21 0506  Weight: 86.9 kg 91 kg 91.4 kg   Examination:  General exam: Chronically ill-appearing male, appears calm and comfortable  Respiratory system: Clear to auscultation. Respiratory effort normal. Cardiovascular system: normal S1 & S2 heard. No JVD, murmurs, rubs, gallops or clicks. No pedal edema. Gastrointestinal system: Abdomen is nondistended, soft and nontender. No organomegaly or masses felt. Normal bowel sounds heard. Central nervous system: Alert and oriented. No focal neurological deficits. Extremities: Symmetric 5 x 5 power. Skin: herpetic blisters seen left lower leg and foot c/w shingles outbreak. Lesions have NOT fully crusted yet.   Psychiatry: Cognitive and memory concerns, episodes of confusion and disorientation with hallucinations/psychosis from time to time  Data Reviewed: I have personally reviewed following labs and imaging studies  CBC: Recent Labs  Lab 01/20/21 0637 01/21/21 0656  WBC 11.8* 12.2*  HGB 8.9* 9.5*  HCT 29.4* 31.5*  MCV 96.7 96.6  PLT 220 782    Basic Metabolic Panel: Recent Labs  Lab 01/17/21 0430  01/18/21 0527 01/19/21 0559 01/20/21 0637 01/21/21 0656  NA 141 143 140 140 141  K 3.8 3.0* 3.5 3.7 3.7  CL 105 107 106 107 107  CO2 23 25 22 22 23   GLUCOSE 126* 98 148* 155* 169*  BUN 114* 112* 110* 113* 122*  CREATININE 4.80* 4.90* 4.94* 4.71* 4.53*  CALCIUM 7.5* 7.2* 7.2* 7.4* 7.5*  PHOS 4.6 5.1* 4.2 3.5 3.5    GFR: Estimated Creatinine Clearance: 14.6 mL/min (A) (by C-G formula based on SCr of 4.53 mg/dL (H)).  Liver Function Tests: Recent Labs  Lab 01/17/21 0430 01/18/21 0527 01/19/21 0559 01/20/21 0637 01/21/21 0656  ALBUMIN 2.3* 2.2* 2.1* 2.0* 2.0*    CBG: Recent Labs  Lab 01/20/21 2119 01/21/21 0301 01/21/21 0759 01/21/21 1236 01/21/21 1721  GLUCAP 166* 140* 153* 258* 244*    No results found for this or any previous visit (from the past 240 hour(s)).    Radiology Studies: No results found.  Scheduled Meds:  alteplase  2 mg Intracatheter Once   alteplase  2 mg Intracatheter Once   aspirin EC  81 mg Oral Daily   calcitRIOL  0.25 mcg Oral Daily   calcium carbonate  800 mg of elemental calcium Oral  TID WC   Chlorhexidine Gluconate Cloth  6 each Topical Q0600   Chlorhexidine Gluconate Cloth  6 each Topical Once   And   Chlorhexidine Gluconate Cloth  6 each Topical Once   cholecalciferol  2,000 Units Oral Daily   darbepoetin (ARANESP) injection - NON-DIALYSIS  100 mcg Subcutaneous Q Tue-1800   diltiazem  180 mg Oral Daily   insulin aspart  0-9 Units Subcutaneous TID WC   insulin aspart  5 Units Subcutaneous TID WC   insulin glargine  8 Units Subcutaneous QHS   lidocaine  1 patch Transdermal Daily   lipase/protease/amylase  12,000 Units Oral TID AC   mouth rinse  15 mL Mouth Rinse BID   metoprolol tartrate  50 mg Oral BID   pravastatin  10 mg Oral q1800   saccharomyces boulardii  250 mg Oral BID   sodium bicarbonate  1,300 mg Oral BID   Continuous Infusions:  sodium chloride     sodium chloride     sodium chloride 10 mL/hr at 01/21/21 0857    [START ON 01/22/2021]  ceFAZolin (ANCEF) IV      Roxan Hockey, MD How to contact the [facility] Attending or Consulting provider [place] or covering provider during after hours [place], for this patient?  Check the care team in [facility] and look for a) attending/consulting TRH provider listed and b) the [facility] team listed Log into [email or web address] and use [place]'s universal password to access. If you do not have the password, please contact the hospital operator. Locate the [facility] provider you are looking for under [facility] and page to a number that you can be directly reached. If you still have difficulty reaching the provider, please page the [facility] (Director on Call) for the Hospitalists listed on amion for assistance.  01/21/2021, 7:21 PM

## 2021-01-21 NOTE — Progress Notes (Signed)
Patient scheduled for dialysis catheter insertion tomorrow by Dr. Constance Haw.  Orders have been placed.  All questions answered.

## 2021-01-22 ENCOUNTER — Inpatient Hospital Stay (HOSPITAL_COMMUNITY): Payer: PPO | Admitting: Anesthesiology

## 2021-01-22 ENCOUNTER — Encounter (HOSPITAL_COMMUNITY)
Admission: EM | Disposition: A | Discharge status: Skilled Nursing Facility | Payer: Self-pay | Source: Home / Self Care | Attending: Internal Medicine

## 2021-01-22 ENCOUNTER — Encounter (HOSPITAL_COMMUNITY): Payer: Self-pay | Admitting: Internal Medicine

## 2021-01-22 ENCOUNTER — Inpatient Hospital Stay (HOSPITAL_COMMUNITY): Payer: PPO

## 2021-01-22 DIAGNOSIS — U071 COVID-19: Secondary | ICD-10-CM | POA: Diagnosis not present

## 2021-01-22 DIAGNOSIS — E872 Acidosis: Secondary | ICD-10-CM | POA: Diagnosis not present

## 2021-01-22 DIAGNOSIS — N178 Other acute kidney failure: Secondary | ICD-10-CM | POA: Diagnosis not present

## 2021-01-22 DIAGNOSIS — B029 Zoster without complications: Secondary | ICD-10-CM | POA: Diagnosis not present

## 2021-01-22 HISTORY — PX: INSERTION OF DIALYSIS CATHETER: SHX1324

## 2021-01-22 LAB — RENAL FUNCTION PANEL
Albumin: 2.1 g/dL — ABNORMAL LOW (ref 3.5–5.0)
Anion gap: 13 (ref 5–15)
BUN: 104 mg/dL — ABNORMAL HIGH (ref 8–23)
CO2: 21 mmol/L — ABNORMAL LOW (ref 22–32)
Calcium: 7.6 mg/dL — ABNORMAL LOW (ref 8.9–10.3)
Chloride: 106 mmol/L (ref 98–111)
Creatinine, Ser: 4.27 mg/dL — ABNORMAL HIGH (ref 0.61–1.24)
GFR, Estimated: 13 mL/min — ABNORMAL LOW (ref >60)
Glucose, Bld: 168 mg/dL — ABNORMAL HIGH (ref 70–99)
Phosphorus: 3.5 mg/dL (ref 2.5–4.6)
Potassium: 4.9 mmol/L (ref 3.5–5.1)
Sodium: 140 mmol/L (ref 135–145)

## 2021-01-22 LAB — CBC
HCT: 34.5 % — ABNORMAL LOW (ref 39.0–52.0)
Hemoglobin: 10.1 g/dL — ABNORMAL LOW (ref 13.0–17.0)
MCH: 28.9 pg (ref 26.0–34.0)
MCHC: 29.3 g/dL — ABNORMAL LOW (ref 30.0–36.0)
MCV: 98.6 fL (ref 80.0–100.0)
Platelets: 175 10*3/uL (ref 150–400)
RBC: 3.5 MIL/uL — ABNORMAL LOW (ref 4.22–5.81)
RDW: 15.4 % (ref 11.5–15.5)
WBC: 16.7 10*3/uL — ABNORMAL HIGH (ref 4.0–10.5)
nRBC: 0 % (ref 0.0–0.2)

## 2021-01-22 LAB — GLUCOSE, CAPILLARY
Glucose-Capillary: 138 mg/dL — ABNORMAL HIGH (ref 70–99)
Glucose-Capillary: 171 mg/dL — ABNORMAL HIGH (ref 70–99)
Glucose-Capillary: 159 mg/dL — ABNORMAL HIGH (ref 70–99)
Glucose-Capillary: 130 mg/dL — ABNORMAL HIGH (ref 70–99)
Glucose-Capillary: 139 mg/dL — ABNORMAL HIGH (ref 70–99)

## 2021-01-22 LAB — HEPATITIS B CORE ANTIBODY, TOTAL: Hep B Core Total Ab: NONREACTIVE

## 2021-01-22 LAB — SURGICAL PCR SCREEN
MRSA, PCR: NEGATIVE
Staphylococcus aureus: NEGATIVE

## 2021-01-22 SURGERY — INSERTION OF DIALYSIS CATHETER
Anesthesia: General | Site: Neck | Laterality: Right

## 2021-01-22 MED ORDER — ONDANSETRON HCL 4 MG/2ML IJ SOLN
INTRAMUSCULAR | Status: AC
  Filled 2021-01-22: qty 2

## 2021-01-22 MED ORDER — LIDOCAINE HCL (PF) 1 % IJ SOLN
INTRAMUSCULAR | Status: AC
  Filled 2021-01-22: qty 30

## 2021-01-22 MED ORDER — HEPARIN SODIUM (PORCINE) 1000 UNIT/ML IJ SOLN
INTRAMUSCULAR | Status: DC | PRN
  Administered 2021-01-22: 4000 [IU]

## 2021-01-22 MED ORDER — FENTANYL CITRATE (PF) 100 MCG/2ML IJ SOLN: 25.0000 ug | INTRAMUSCULAR | Status: DC | PRN

## 2021-01-22 MED ORDER — PROPOFOL 500 MG/50ML IV EMUL
INTRAVENOUS | Status: DC | PRN
  Administered 2021-01-22: 100 ug/kg/min via INTRAVENOUS

## 2021-01-22 MED ORDER — PROPOFOL 10 MG/ML IV BOLUS
INTRAVENOUS | Status: AC
  Filled 2021-01-22: qty 20

## 2021-01-22 MED ORDER — OXYCODONE HCL 5 MG PO TABS: 5.0000 mg | ORAL_TABLET | ORAL | Status: AC | PRN

## 2021-01-22 MED ORDER — ONDANSETRON HCL 4 MG/2ML IJ SOLN
INTRAMUSCULAR | Status: DC | PRN
  Administered 2021-01-22: 4 mg via INTRAVENOUS

## 2021-01-22 MED ORDER — HEPARIN SODIUM (PORCINE) 1000 UNIT/ML IJ SOLN
INTRAMUSCULAR | Status: AC
  Filled 2021-01-22: qty 4

## 2021-01-22 MED ORDER — LIDOCAINE HCL (PF) 1 % IJ SOLN
INTRAMUSCULAR | Status: DC | PRN
  Administered 2021-01-22: 9 mL

## 2021-01-22 MED ORDER — FENTANYL CITRATE (PF) 100 MCG/2ML IJ SOLN
INTRAMUSCULAR | Status: DC | PRN
  Administered 2021-01-22: 25 ug via INTRAVENOUS

## 2021-01-22 MED ORDER — FENTANYL CITRATE (PF) 100 MCG/2ML IJ SOLN
INTRAMUSCULAR | Status: AC
  Filled 2021-01-22: qty 2

## 2021-01-22 MED ORDER — PROPOFOL 10 MG/ML IV BOLUS
INTRAVENOUS | Status: DC | PRN
  Administered 2021-01-22: 20 mg via INTRAVENOUS

## 2021-01-22 MED ORDER — SODIUM CHLORIDE (PF) 0.9 % IJ SOLN
INTRAMUSCULAR | Status: DC | PRN
  Administered 2021-01-22: 500 mL

## 2021-01-22 MED ORDER — HEPARIN SODIUM (PORCINE) 1000 UNIT/ML DIALYSIS
3800.0000 [IU] | INTRAMUSCULAR | Status: DC | PRN
  Filled 2021-01-22: qty 4

## 2021-01-22 SURGICAL SUPPLY — 41 items
APPLICATOR CHLORAPREP 10.5 ORG (MISCELLANEOUS) ×2 IMPLANT
BAG DECANTER FOR FLEXI CONT (MISCELLANEOUS) ×2 IMPLANT
BIOPATCH RED 1 DISK 7.0 (GAUZE/BANDAGES/DRESSINGS) ×2 IMPLANT
CATH PALINDROME-P 19CM W/VT (CATHETERS) IMPLANT
CATH PALINDROME-P 23CM W/VT (CATHETERS) ×2 IMPLANT
COVER LIGHT HANDLE STERIS (MISCELLANEOUS) ×4 IMPLANT
COVER PROBE U/S 5X48 (MISCELLANEOUS) ×2 IMPLANT
COVER WAND RF STERILE (DRAPES) ×2 IMPLANT
DECANTER SPIKE VIAL GLASS SM (MISCELLANEOUS) ×4 IMPLANT
DERMABOND ADVANCED (GAUZE/BANDAGES/DRESSINGS) ×1
DERMABOND ADVANCED .7 DNX12 (GAUZE/BANDAGES/DRESSINGS) ×1 IMPLANT
DRAPE C-ARM FOLDED MOBILE STRL (DRAPES) ×2 IMPLANT
DRAPE CHEST BREAST 15X10 FENES (DRAPES) ×2 IMPLANT
DRSG SORBAVIEW 3.5X5-5/16 MED (GAUZE/BANDAGES/DRESSINGS) ×2 IMPLANT
ELECT REM PT RETURN 9FT ADLT (ELECTROSURGICAL) ×2 IMPLANT
ELECTRODE REM PT RTRN 9FT ADLT (ELECTROSURGICAL) ×1 IMPLANT
GAUZE 4X4 16PLY RFD (DISPOSABLE) ×2 IMPLANT
GEL ULTRASOUND 20GR AQUASONIC (MISCELLANEOUS) ×2 IMPLANT
GLOVE SURG ENC MOIS LTX SZ6.5 (GLOVE) ×2 IMPLANT
GLOVE SURG UNDER POLY LF SZ6.5 (GLOVE) ×2 IMPLANT
GLOVE SURG UNDER POLY LF SZ7 (GLOVE) ×4 IMPLANT
GOWN STRL REUS W/TWL LRG LVL3 (GOWN DISPOSABLE) ×4 IMPLANT
IV CONNECTOR ONE LINK NDLESS (IV SETS) IMPLANT
IV NS 500ML (IV SOLUTION) ×1
IV NS 500ML BAXH (IV SOLUTION) ×1 IMPLANT
KIT BLADEGUARD II DBL (SET/KITS/TRAYS/PACK) ×2 IMPLANT
KIT TURNOVER KIT A (KITS) ×2 IMPLANT
MARKER SKIN DUAL TIP RULER LAB (MISCELLANEOUS) ×2 IMPLANT
NEEDLE HYPO 18GX1.5 BLUNT FILL (NEEDLE) ×2 IMPLANT
NEEDLE HYPO 25X1 1.5 SAFETY (NEEDLE) ×2 IMPLANT
PACK BASIC III (CUSTOM PROCEDURE TRAY) ×1
PACK SRG BSC III STRL LF ECLPS (CUSTOM PROCEDURE TRAY) ×1 IMPLANT
PAD ARMBOARD 7.5X6 YLW CONV (MISCELLANEOUS) ×2 IMPLANT
PENCIL SMOKE EVACUATOR COATED (MISCELLANEOUS) ×2 IMPLANT
SET BASIN LINEN APH (SET/KITS/TRAYS/PACK) ×2 IMPLANT
SUT MNCRL AB 4-0 PS2 18 (SUTURE) ×2 IMPLANT
SUT SILK 2 0 FSL 18 (SUTURE) ×2 IMPLANT
SUT VIC AB 3-0 SH 27 (SUTURE) ×1
SUT VIC AB 3-0 SH 27X BRD (SUTURE) ×1 IMPLANT
SYR 10ML LL (SYRINGE) ×4 IMPLANT
SYR CONTROL 10ML LL (SYRINGE) ×2 IMPLANT

## 2021-01-22 NOTE — Op Note (Signed)
Operative Note 01/22/21   Preoperative Diagnosis: End Stage Renal Disease    Postoperative Diagnosis: Same   Procedure(s) Performed: Tunneled Dialysis Catheter Placement, Right Internal Jugular    Surgeon: Lanell Matar. Constance Haw, MD   Assistants: No qualified resident was available   Anesthesia: Monitored anesthesia care   Anesthesiologist: Denese Killings, MD    Specimens: None   Estimated Blood Loss: Minimal    Blood Replacement: None    Complications: None    Operative Findings: Normal anatomy  Indications: Mr. Spiller is a 80 yo with acute on chronic kidney disease who has progressed to end stage and needs dialysis access. Discussed risk of bleeding, infection, injury to vessels and pneumothorax.   Procedure: The patient was brought into the operating room and monitored anesthesia was induced.   The right chest and neck was prepped and draped in the usual sterile fashion.  Preoperative antibiotics given.   An Ultrasound was used to verify that the right internal jugular vein was patent.  One percent lidocaine was used for local anesthesia.  The patient was measured and a 23 cm Palindrome dual lumen dialysis catheter.  The needles advanced into the right internal jugular vein using the Seldinger technique without difficulty.  A guidewire was then advanced into the right atrium under fluoroscopic guidance.  Ectopia was noted and the wire was pulled back.  The wire was secured.  An incision was made over the right chest and the catheter was tunneled to the neck.  The ultrasound again confirmed the wire was going into the vein only. Dilators were used over the wire to dilate the track.  An introducer and peel-away sheath were placed over the guidewire. The catheter was then inserted through the peel-away sheath and the peel-away sheath was removed.  A spot film was performed to confirm the position.  The catheter drew back and flushed easily. The lumens were packed with heparin.  Hemostats were used to position the catheter in the neck incision. The neck incision was closed with 4-0 Monocryl and Dermabond. The catheter was secured with 2-0 silk suture and a sterile Biopatch and dressing was applied.  Hemostasis was confirmed.     All tape and needle counts were correct at the end of the procedure. The patient was transferred to PACU in stable condition. A chest x-ray will be performed at that time.  Curlene Labrum, MD [facility] [address] [place], [place] [address] [phone] (office)

## 2021-01-22 NOTE — Transfer of Care (Signed)
Immediate Anesthesia Transfer of Care Note  Patient: Mark Jumbo Sr.  Procedure(s) Performed: INSERTION OF DIALYSIS CATHETER (Right: Neck)  Patient Location: PACU  Anesthesia Type:MAC  Level of Consciousness: awake, alert  and oriented  Airway & Oxygen Therapy: Patient Spontanous Breathing  Post-op Assessment: Report given to RN and Post -op Vital signs reviewed and stable  Post vital signs: Reviewed and stable  Last Vitals:  Vitals Value Taken Time  BP 109/58 01/22/21 1505  Temp    Pulse 88 01/22/21 1505   Resp 14 01/22/21 1505  SpO2 93 on RA 01/22/21 1505    Last Pain:  Vitals:   01/22/21 0940  TempSrc:   PainSc: 0-No pain      Patients Stated Pain Goal: 0 (72/82/06 0156)  Complications: No notable events documented.

## 2021-01-22 NOTE — Procedures (Signed)
   HEMODIALYSIS TREATMENT NOTE:  Uneventful 3.5 hour heparin-free HD completed via RIJ TDC.  Catheter tolerated prescribed flow with stable pressures.   Sanguinous oozing from entry site.  Dressing was changed and pressure was held along the tunnel.  Goal met: 500cc removed without interruption in ultrafiltration.  Hemodynamically stable throughout session pulse 70-100, NO tachycardia. All blood was returned.  No changes from pre-HD assessment.  Rockwell Alexandria, RN

## 2021-01-22 NOTE — Progress Notes (Signed)
[facility]  CXR reviewed, looks in good position and no PTX.  Official read pending.  Curlene Labrum, MD [facility] [address] [place], [place] [address] [phone] (office)

## 2021-01-22 NOTE — TOC Progression Note (Addendum)
Transition of Care (TOC) - Progression Note    Patient Details  Name: Mark DECUIR Sr. MRN: [id number] Date of Birth: [birth year]  Transition of Care [facility]) CM/SW Contact  Salome Arnt, [place] Phone Number: 01/22/2021, 2:05 PM  Clinical Narrative:  Discussed pt with MD and pt will need outpatient dialysis set up. LCSW spoke with pt's wife who states pt was followed by Dr. Joelyn Oms. Discussed referral to [facility] in [place] and pt's wife is agreeable. CLIP process started. LCSW updated [facility] that pt will need transportation to outpatient dialysis. TOC will follow up with [facility] in AM. LCSW left voicemail with [facility] to restart SNF auth. Requested updated PT notes.   Update: LCSW spoke with Colletta [place] with [employer] and will restart authorization. EMS authorization still valid: [phone].    Expected Discharge Plan: Skilled Nursing Facility Barriers to Discharge: Continued Medical Work up  Expected Discharge Plan and Services Expected Discharge Plan: [place] Choice: [place] arrangements for the past 2 months: Single Family Home                                       Social Determinants of Health (SDOH) Interventions    Readmission Risk Interventions Readmission Risk Prevention Plan 12/04/2018  Transportation Screening Complete  PCP or Specialist Appt within 3-5 Days (No Data)  [place] or [employer] Complete  Social Work Consult for [place] Planning/Counseling Complete  Palliative Care Screening Not Applicable  Medication Review [employer]) Complete  Some recent data might be hidden

## 2021-01-22 NOTE — Progress Notes (Signed)
[facility]  Left wife a VM. CXR ordered for in room given his contact precautions.   Updated team.   Curlene Labrum, MD [facility] [address] [place], [place] [address] [phone] (office)

## 2021-01-22 NOTE — Progress Notes (Signed)
PROGRESS NOTE   [name]  IWL:[id number] DOB: February 24, 1941 DOA: 12/23/2020 PCP: [name], MD   Chief Complaint  Patient presents with   Weakness   Level of care: Med-Surg  Brief Admission History:  80 year old gentleman with type 2 diabetes mellitus, stage IIIb CKD, dyslipidemia, hypertension, diastolic congestive heart failure and coronary artery disease presented with increasing weakness and confusion.  He was recently diagnosed with COVID-19 on 12/09/2020.  He was admitted with acute kidney injury. --Right IJ PermCath placed for HD on 01/22/2021 -May go to SNF rehab after clipping process is completed  Assessment & Plan:   Active Problems:   AKI (acute kidney injury) ([place])   Acute renal failure superimposed on stage 3b chronic kidney disease (HCC)   Acute metabolic encephalopathy   COVID-19 virus infection   Metabolic acidosis   SVT (supraventricular tachycardia) (HCC)   Pressure injury of skin   Shingles  AKI on CKD stage IIIb-voiding well, nonoliguric - -Suspect patient had ischemic ATN due to hypotension and dehydration/volume depletion -Started on HD on 12/29/2020 due to azotemia, last HD session was 01/08/2021 -Overall mentation and overall Renal function improved with hemodialysis  left internal jugular HD catheter in place since 12/29/2020--was removed on 01/15/21 -As per nephrologist restarting hemodialysis on 01/22/21 given ongoing episodes of confusion and rising BUN ---Right IJ PermCath placed for HD on 01/22/2021 -May go to SNF rehab after clipping process is completed  Acute shingles outbreak - involving the left foot and leg in a dermatomal pattern. -Valtrex to be renally dosed per pharmacy  continue airborne and contact precautions.  Can dc airborne when lesions have crusted and dried up.   -As of 01/22/2021 patient lesions have not completely crusted but they are no longer painful   He was advised to avoid touching eyes.  Continue valtrex for at  least 7 days, last dose 01/20/2021.   Acute metabolic encephalopathy-much improved, still has occasional episodes of confusion and hallucinations -Hopefully this may clear up with reinitiation of hemodialysis  Hypocalcemia/hypokalemia/hypomagnesemia-replaced, c/n Tums and vitamin D.  Status post calcium gluconate on 01/05/2021.  Anemia in CKD- he was transfused 2 units PRBC on 01/07/2021. -ESA/Epogen per nephrology team -Transfuse as clinically indicated  DKA-resolved  Uncontrolled type 2 diabetes mellitus with renal complications-continue Lantus insulin 8 units nightly, prandial insulin coverage adjusted  CBG (last 3)  Recent Labs    01/22/21 0720 01/22/21 1114 01/22/21 1518  GLUCAP 171* 159* 130*    Recent COVID-19 infection- patient status post 3 days of remdesivir for asymptomatic infection.  He has been stable on room air.  He is now over  30 days post diagnosis and no longer in quarantine.  Thrombocytopenia-resolved.  SVT/A. fib with RVR- Cardizem and Lopressor as ordered by cardiology service.  Increased cardizem to 180 mg per cardio recs. Continue metoprolol 50 mg BID.   Coronary artery disease- no chest pain symptoms at this time.  Continue aspirin pravastatin and metoprolol  Resting tremor - Possibly related to severe low Mg levels, seems to be improving, follow closely.   Consider outpatient neurology follow-up if persistent despite hemodialysis  Essential hypertension- stable on current medications.  Aortic root dilatation-this is being followed outpatient with cardiology.  Leukocytosis noted on 01/20/2021, no fevers no evidence of new infection per se -Leukocytosis persist ---may need outpatient hematology follow-up  DVT prophylaxis: SCDs Code Status: Full  Family Communication: wife updated by telephone 01/22/21 Disposition: SNF  Status is: Inpatient  Remains inpatient appropriate because: --Right  IJ PermCath placed for HD on 01/22/2021 --Right IJ PermCath placed  for HD on 01/22/2021 -May go to SNF rehab after clipping process is completed  Dispo: The patient is from: Home              Anticipated d/c is to: SNF--- apparently SNF will not be able to take patient until his shingles lesions have crusted over, also --Right IJ PermCath placed for HD on 01/22/2021 -May go to SNF rehab after clipping process is completed              Patient currently is medically stable to d/c.   Difficult to place patient No   Consultants:  Cardiology Nephrology  Gen surgery for HD cath placement Procedures:  --Right IJ PermCath placed for HD on 01/22/2021   Antimicrobials:   Valtrex 01/14/21>>  Subjective: -Tolerated HD catheter placement well no new concerns no fevers no chills oral intake is fair episodes of confusion persist from time to time  Objective: Vitals:   01/22/21 1750 01/22/21 1800 01/22/21 1830 01/22/21 1900  BP: (!) 147/77 111/78 128/72 125/85  Pulse: 81 78 80 74  Resp:      Temp:      TempSrc:      SpO2:      Weight:      Height:        Intake/Output Summary (Last 24 hours) at 01/22/2021 1921 Last data filed at 01/22/2021 1800 Gross per 24 hour  Intake 906.83 ml  Output 2170 ml  Net -1263.17 ml   Filed Weights   01/21/21 0506 01/22/21 0500 01/22/21 1740  Weight: 91.4 kg 87.2 kg 87.5 kg   Examination:  General exam: Chronically ill-appearing male, in no acute distress, cooperative Neck-right IJ HD catheter Respiratory system: Clear to auscultation. Respiratory effort normal. Cardiovascular system: normal S1 & S2 heard. No JVD, murmurs, rubs, gallops or clicks. No pedal edema. Gastrointestinal system: Abdomen is nondistended, soft and nontender. . Normal bowel sounds heard. Central nervous system: Alert and oriented. No focal neurological deficits. Extremities: Symmetric 5 x 5 power. Skin: herpetic blisters seen left lower leg and foot c/w shingles outbreak. Lesions have NOT fully crusted yet.   Psychiatry: Cognitive and memory  concerns, episodes of confusion and disorientation with hallucinations/psychosis from time to time  Data Reviewed: I have personally reviewed following labs and imaging studies  CBC: Recent Labs  Lab 01/20/21 0637 01/21/21 0656 01/22/21 0655  WBC 11.8* 12.2* 16.7*  HGB 8.9* 9.5* 10.1*  HCT 29.4* 31.5* 34.5*  MCV 96.7 96.6 98.6  PLT 220 205 086    Basic Metabolic Panel: Recent Labs  Lab 01/18/21 0527 01/19/21 0559 01/20/21 0637 01/21/21 0656 01/22/21 0655  NA 143 140 140 141 140  K 3.0* 3.5 3.7 3.7 4.9  CL 107 106 107 107 106  CO2 25 22 22 23  21*  GLUCOSE 98 148* 155* 169* 168*  BUN 112* 110* 113* 122* 104*  CREATININE 4.90* 4.94* 4.71* 4.53* 4.27*  CALCIUM 7.2* 7.2* 7.4* 7.5* 7.6*  PHOS 5.1* 4.2 3.5 3.5 3.5    GFR: Estimated Creatinine Clearance: 15.2 mL/min (A) (by C-G formula based on SCr of 4.27 mg/dL (H)).  Liver Function Tests: Recent Labs  Lab 01/18/21 0527 01/19/21 0559 01/20/21 0637 01/21/21 0656 01/22/21 0655  ALBUMIN 2.2* 2.1* 2.0* 2.0* 2.1*    CBG: Recent Labs  Lab 01/21/21 2002 01/22/21 0325 01/22/21 0720 01/22/21 1114 01/22/21 1518  GLUCAP 288* 138* 171* 159* 130*    Recent Results (  from the past 240 hour(s))  Surgical pcr screen     Status: None   Collection Time: 01/21/21  9:21 PM   Specimen: Nasal Mucosa; Nasal Swab  Result Value Ref Range Status   MRSA, PCR NEGATIVE NEGATIVE Final   Staphylococcus aureus NEGATIVE NEGATIVE Final    Comment: (NOTE) The Xpert SA Assay (FDA approved for NASAL specimens in patients 79 years of age and older), is one component of a comprehensive surveillance program. It is not intended to diagnose infection nor to guide or monitor treatment. Performed at [facility], [address], [place], [place] [address]       Radiology Studies: [facility] Chest [facility] 1 View  Result Date: 01/22/2021 CLINICAL DATA:  Dialysis catheter placement EXAM: PORTABLE CHEST 1 VIEW COMPARISON:  12/29/2020 FINDINGS: Interval  placement of large-bore right neck multi lumen vascular catheter, tip projecting near the superior cavoatrial junction. Unchanged elevation of the right hemidiaphragm. No acute appearing airspace opacity. The heart and mediastinum are unremarkable. IMPRESSION: 1. Interval placement of large-bore right neck multi lumen vascular catheter, tip projecting near the superior cavoatrial junction. 2. Unchanged elevation of the right hemidiaphragm. No acute appearing airspace opacity. Electronically Signed   By: Eddie Candle M.D.   On: 01/22/2021 16:23   DG C-Arm 1-60 Min-No Report  Result Date: 01/22/2021 Fluoroscopy was utilized by the requesting physician.  No radiographic interpretation.    Scheduled Meds:  alteplase  2 mg Intracatheter Once   alteplase  2 mg Intracatheter Once   aspirin EC  81 mg Oral Daily   calcitRIOL  0.25 mcg Oral Daily   calcium carbonate  800 mg of elemental calcium Oral TID WC   Chlorhexidine Gluconate Cloth  6 each Topical Q0600   cholecalciferol  2,000 Units Oral Daily   darbepoetin (ARANESP) injection - NON-DIALYSIS  100 mcg Subcutaneous Q Tue-1800   diltiazem  180 mg Oral Daily   heparin sodium (porcine)       insulin aspart  0-9 Units Subcutaneous TID WC   insulin aspart  5 Units Subcutaneous TID WC   insulin glargine  8 Units Subcutaneous QHS   lidocaine  1 patch Transdermal Daily   lipase/protease/amylase  12,000 Units Oral TID AC   mouth rinse  15 mL Mouth Rinse BID   metoprolol tartrate  50 mg Oral BID   pravastatin  10 mg Oral q1800   saccharomyces boulardii  250 mg Oral BID   sodium bicarbonate  1,300 mg Oral BID   Continuous Infusions:  sodium chloride     sodium chloride     sodium chloride 0 mL/hr at 01/22/21 0045    Roxan Hockey, MD How to contact the [facility] Attending or Consulting provider [place] or covering provider during after hours [place], for this patient?  Check the care team in [facility] and look for a) attending/consulting TRH provider listed  and b) the [facility] team listed Log into [email or web address] and use [place]'s universal password to access. If you do not have the password, please contact the hospital operator. Locate the [facility] provider you are looking for under [facility] and page to a number that you can be directly reached. If you still have difficulty reaching the provider, please page the [facility] (Director on Call) for the Hospitalists listed on amion for assistance.  01/22/2021, 7:21 PM

## 2021-01-22 NOTE — Consult Note (Signed)
[facility] Consult  Reason for Consult:Dialysis catheter  Referring Physician:  Dr. Denton Brick  Chief Complaint   Weakness     HPI: Mark Jumbo Sr. is a 80 y.o. male with AKI and COVID who needs more permanent dialysis access. He has been in the hospital now for 30 days. He had a temporary catheter that was removed after dialyzing for a while. He will need to continue dialysis. I was asked to place one. He is on contact precautions due to shingles. He has no fever but does have a worsening leukocytosis of unknown etiology.  Past Medical History:  Diagnosis Date   Arthritis    lower back   Chronic back pain    Disc disease   Chronic kidney disease    Coronary atherosclerosis of native coronary artery    Coronary calcifications by chest CT, Myoview demonstrating inferolateral scar   Deafness in right ear    Diabetes mellitus    Diabetic retinopathy (HCC)    moderate nonproliferative with macular edema in right eye   Diastolic dysfunction 10/158   Grade 1.   Essential hypertension, benign    Heart murmur    in past   HOH (hard of hearing)    Hyperkalemia    Kidney stones    Mixed hyperlipidemia    NAFLD (nonalcoholic fatty liver disease)    Pancreatic insufficiency    Diagnosed at [facility]   Prostate cancer [facility]) 2011   Shortness of breath dyspnea    occasional - liver pressing on right lung, decreased capacity   Subdural hematoma (HCC)    Type 2 diabetes mellitus ([place])    Wears hearing aid    "crossover" form right to left    Past Surgical History:  Procedure Laterality Date   APPENDECTOMY     Bilateral hip replacement     CATARACT EXTRACTION W/PHACO Left 05/15/2015   Procedure: CATARACT EXTRACTION PHACO AND INTRAOCULAR LENS PLACEMENT ([place]);  Surgeon: Ronnell Freshwater, MD;  Location: [place];  Service: Ophthalmology;  Laterality: Left;  DIABETIC - insulin pump and oral meds   CATARACT EXTRACTION W/PHACO Right 08/28/2015    Procedure: CATARACT EXTRACTION PHACO AND INTRAOCULAR LENS PLACEMENT ([facility]);  Surgeon: Ronnell Freshwater, MD;  Location: [place];  Service: Ophthalmology;  Laterality: Right;  DIABETIC PER PT AND CINDY PLEASE KEEP ARRIVAL TIME AFTER 8AM    CHOLECYSTECTOMY     HERNIA REPAIR     PROSTATECTOMY  2011    Family History  Problem Relation Age of Onset   Diabetes type II Mother    Heart attack Father     Social History   Tobacco Use   Smoking status: Never   Smokeless tobacco: Never  Substance Use Topics   Alcohol use: No   Drug use: No    Medications: I have reviewed the patient's current medications. Prior to Admission:  Medications Prior to Admission  Medication Sig Dispense Refill Last Dose   albuterol (PROVENTIL HFA;VENTOLIN HFA) 108 (90 Base) MCG/ACT inhaler Inhale 2 puffs into the lungs every 4 (four) hours as needed for wheezing or shortness of breath. 1 Inhaler 0 12/22/2020 at Unknown time   aspirin EC 81 MG tablet Take 81 mg by mouth at bedtime.    12/22/2020 at Unknown time   carvedilol (COREG) 25 MG tablet TAKE 1 TABLET(25 MG) BY MOUTH TWICE DAILY WITH A MEAL 180 tablet 3 12/23/2020 at 0800   Continuous Blood Gluc Receiver (FREESTYLE LIBRE 2 READER) DEVI Use  1 Device as directed      doxazosin (CARDURA) 8 MG tablet TAKE 1 TABLET(8 MG) BY MOUTH DAILY (Patient taking differently: Take 8 mg by mouth daily.) 90 tablet 3 12/22/2020 at Unknown time   ergocalciferol (VITAMIN D2) 1.25 MG (50000 UT) capsule Take 50,000 Units by mouth once a week.   12/22/2020 at Unknown time   furosemide (LASIX) 20 MG tablet TAKE 1 AND 1/2 TABLETS(30 MG) BY MOUTH DAILY (Patient taking differently: Take 30 mg by mouth daily.) 135 tablet 3 12/23/2020 at Unknown time   insulin regular (NOVOLIN R,HUMULIN R) 100 units/mL injection Inject 8 Units into the skin 3 (three) times daily before meals. 12u am  10u supper   12/22/2020 at Unknown time   ketoconazole (NIZORAL) 2 % cream Apply to both feet  and between toes once daily for 4 weeks. 30 g 1 12/22/2020 at Unknown time   Multiple Vitamin (MULTIVITAMIN WITH MINERALS) TABS Take 1 tablet by mouth daily.   12/23/2020 at Unknown time   pravastatin (PRAVACHOL) 10 MG tablet TAKE 1 TABLET(10 MG) BY MOUTH DAILY WITH BREAKFAST (Patient taking differently: Take 10 mg by mouth daily before breakfast.) 90 tablet 3 12/23/2020 at Unknown time   valACYclovir (VALTREX) 1000 MG tablet Take 1 tablet (1,000 mg total) by mouth 3 (three) times daily. 21 tablet 0 Past Month at Unknown time   ZENPEP 10000-32000 units CPEP TAKE 1 CAPSULE BY MOUTH THREE TIMES DAILY BEFORE MEALS (Patient taking differently: Take 1 capsule by mouth 3 (three) times daily before meals.) 270 capsule 2 12/22/2020 at Unknown time   diazepam (VALIUM) 5 MG tablet TAKE 1 TABLET BY MOUTH TWICE DAILY AS NEEDED FOR ANXIETY 60 tablet 3 PRN   Scheduled:  alteplase  2 mg Intracatheter Once   alteplase  2 mg Intracatheter Once   aspirin EC  81 mg Oral Daily   calcitRIOL  0.25 mcg Oral Daily   calcium carbonate  800 mg of elemental calcium Oral TID WC   Chlorhexidine Gluconate Cloth  6 each Topical Q0600   cholecalciferol  2,000 Units Oral Daily   darbepoetin (ARANESP) injection - NON-DIALYSIS  100 mcg Subcutaneous Q Tue-1800   diltiazem  180 mg Oral Daily   insulin aspart  0-9 Units Subcutaneous TID WC   insulin aspart  5 Units Subcutaneous TID WC   insulin glargine  8 Units Subcutaneous QHS   lidocaine  1 patch Transdermal Daily   lipase/protease/amylase  12,000 Units Oral TID AC   mouth rinse  15 mL Mouth Rinse BID   metoprolol tartrate  50 mg Oral BID   pravastatin  10 mg Oral q1800   saccharomyces boulardii  250 mg Oral BID   sodium bicarbonate  1,300 mg Oral BID   Continuous:  sodium chloride     sodium chloride     sodium chloride Stopped (01/22/21 0034)    ceFAZolin (ANCEF) IV     AOZ:HYQMVH chloride, sodium chloride, acetaminophen **OR** acetaminophen, dextrose, heparin,  heparin, hydrOXYzine, ondansetron **OR** ondansetron (ZOFRAN) IV  Allergies  Allergen Reactions   Enalapril Maleate Cough     ROS:  A comprehensive review of systems was negative. Nose bleed this AM, stopped Some confusion  Blood pressure (!) 165/83, pulse (!) 104, temperature 97.6 F (36.4 C), temperature source Oral, resp. rate 18, height 5' 8.5" (1.74 m), weight 87.2 kg, SpO2 97 %. Physical Exam Vitals reviewed.  HENT:     Head: Normocephalic.     Nose: Nose normal.  Mouth/Throat:     Mouth: Mucous membranes are moist.  Eyes:     Extraocular Movements: Extraocular movements intact.  Cardiovascular:     Rate and Rhythm: Normal rate.  Pulmonary:     Effort: Pulmonary effort is normal.  Abdominal:     General: There is no distension.     Palpations: Abdomen is soft.     Tenderness: There is no abdominal tenderness.  Musculoskeletal:        General: Normal range of motion.     Cervical back: Normal range of motion.  Skin:    General: Skin is warm.  Neurological:     General: No focal deficit present.     Mental Status: He is alert.  Psychiatric:        Mood and Affect: Mood normal.        Behavior: Behavior normal.        Thought Content: Thought content normal.        Judgment: Judgment normal.    Results: Results for orders placed or performed during the hospital encounter of 12/23/20 (from the past 48 hour(s))  Glucose, capillary     Status: Abnormal   Collection Time: 01/20/21  5:07 PM  Result Value Ref Range   Glucose-Capillary 156 (H) 70 - 99 mg/dL    Comment: Glucose reference range applies only to samples taken after fasting for at least 8 hours.  Glucose, capillary     Status: Abnormal   Collection Time: 01/20/21  9:19 PM  Result Value Ref Range   Glucose-Capillary 166 (H) 70 - 99 mg/dL    Comment: Glucose reference range applies only to samples taken after fasting for at least 8 hours.  Glucose, capillary     Status: Abnormal   Collection Time:  01/21/21  3:01 AM  Result Value Ref Range   Glucose-Capillary 140 (H) 70 - 99 mg/dL    Comment: Glucose reference range applies only to samples taken after fasting for at least 8 hours.  Renal function panel     Status: Abnormal   Collection Time: 01/21/21  6:56 AM  Result Value Ref Range   Sodium 141 135 - 145 mmol/L   Potassium 3.7 3.5 - 5.1 mmol/L   Chloride 107 98 - 111 mmol/L   CO2 23 22 - 32 mmol/L   Glucose, Bld 169 (H) 70 - 99 mg/dL    Comment: Glucose reference range applies only to samples taken after fasting for at least 8 hours.   BUN 122 (H) 8 - 23 mg/dL    Comment: RESULTS CONFIRMED BY MANUAL DILUTION   Creatinine, Ser 4.53 (H) 0.61 - 1.24 mg/dL   Calcium 7.5 (L) 8.9 - 10.3 mg/dL   Phosphorus 3.5 2.5 - 4.6 mg/dL   Albumin 2.0 (L) 3.5 - 5.0 g/dL   GFR, Estimated 12 (L) >60 mL/min    Comment: (NOTE) Calculated using the CKD-EPI Creatinine Equation (2021)    Anion gap 11 5 - 15    Comment: Performed at [facility], [address] [place], [place] [address]  CBC     Status: Abnormal   Collection Time: 01/21/21  6:56 AM  Result Value Ref Range   WBC 12.2 (H) 4.0 - 10.5 K/uL   RBC 3.26 (L) 4.22 - 5.81 MIL/uL   Hemoglobin 9.5 (L) 13.0 - 17.0 g/dL   HCT 31.5 (L) 39.0 - 52.0 %   MCV 96.6 80.0 - 100.0 fL   MCH 29.1 26.0 - 34.0 pg  MCHC 30.2 30.0 - 36.0 g/dL   RDW 15.2 11.5 - 15.5 %   Platelets 205 150 - 400 K/uL   nRBC 0.0 0.0 - 0.2 %    Comment: Performed at [facility], [address], [place], [place] [address]  Glucose, capillary     Status: Abnormal   Collection Time: 01/21/21  7:59 AM  Result Value Ref Range   Glucose-Capillary 153 (H) 70 - 99 mg/dL    Comment: Glucose reference range applies only to samples taken after fasting for at least 8 hours.  Glucose, capillary     Status: Abnormal   Collection Time: 01/21/21 12:36 PM  Result Value Ref Range   Glucose-Capillary 258 (H) 70 - 99 mg/dL    Comment: Glucose reference range applies only to samples taken  after fasting for at least 8 hours.  Glucose, capillary     Status: Abnormal   Collection Time: 01/21/21  5:21 PM  Result Value Ref Range   Glucose-Capillary 244 (H) 70 - 99 mg/dL    Comment: Glucose reference range applies only to samples taken after fasting for at least 8 hours.  Glucose, capillary     Status: Abnormal   Collection Time: 01/21/21  8:02 PM  Result Value Ref Range   Glucose-Capillary 288 (H) 70 - 99 mg/dL    Comment: Glucose reference range applies only to samples taken after fasting for at least 8 hours.  Surgical pcr screen     Status: None   Collection Time: 01/21/21  9:21 PM   Specimen: Nasal Mucosa; Nasal Swab  Result Value Ref Range   MRSA, PCR NEGATIVE NEGATIVE   Staphylococcus aureus NEGATIVE NEGATIVE    Comment: (NOTE) The Xpert SA Assay (FDA approved for NASAL specimens in patients 15 years of age and older), is one component of a comprehensive surveillance program. It is not intended to diagnose infection nor to guide or monitor treatment. Performed at [facility], [address], [place], [place] [address]   Glucose, capillary     Status: Abnormal   Collection Time: 01/22/21  3:25 AM  Result Value Ref Range   Glucose-Capillary 138 (H) 70 - 99 mg/dL    Comment: Glucose reference range applies only to samples taken after fasting for at least 8 hours.   Comment 1 Notify RN    Comment 2 Document in Chart   Renal function panel     Status: Abnormal   Collection Time: 01/22/21  6:55 AM  Result Value Ref Range   Sodium 140 135 - 145 mmol/L   Potassium 4.9 3.5 - 5.1 mmol/L    Comment: DELTA CHECK NOTED   Chloride 106 98 - 111 mmol/L   CO2 21 (L) 22 - 32 mmol/L   Glucose, Bld 168 (H) 70 - 99 mg/dL    Comment: Glucose reference range applies only to samples taken after fasting for at least 8 hours.   BUN 104 (H) 8 - 23 mg/dL   Creatinine, Ser 4.27 (H) 0.61 - 1.24 mg/dL   Calcium 7.6 (L) 8.9 - 10.3 mg/dL   Phosphorus 3.5 2.5 - 4.6 mg/dL   Albumin 2.1 (L)  3.5 - 5.0 g/dL   GFR, Estimated 13 (L) >60 mL/min    Comment: (NOTE) Calculated using the CKD-EPI Creatinine Equation (2021)    Anion gap 13 5 - 15    Comment: Performed at [facility], [address], [place],  [address]  CBC     Status: Abnormal   Collection  Time: 01/22/21  6:55 AM  Result Value Ref Range   WBC 16.7 (H) 4.0 - 10.5 K/uL   RBC 3.50 (L) 4.22 - 5.81 MIL/uL   Hemoglobin 10.1 (L) 13.0 - 17.0 g/dL   HCT 34.5 (L) 39.0 - 52.0 %   MCV 98.6 80.0 - 100.0 fL   MCH 28.9 26.0 - 34.0 pg   MCHC 29.3 (L) 30.0 - 36.0 g/dL   RDW 15.4 11.5 - 15.5 %   Platelets 175 150 - 400 K/uL   nRBC 0.0 0.0 - 0.2 %    Comment: Performed at [facility], [address] [place], [place] [address]  Glucose, capillary     Status: Abnormal   Collection Time: 01/22/21  7:20 AM  Result Value Ref Range   Glucose-Capillary 171 (H) 70 - 99 mg/dL    Comment: Glucose reference range applies only to samples taken after fasting for at least 8 hours.  Glucose, capillary     Status: Abnormal   Collection Time: 01/22/21 11:14 AM  Result Value Ref Range   Glucose-Capillary 159 (H) 70 - 99 mg/dL    Comment: Glucose reference range applies only to samples taken after fasting for at least 8 hours.    No results found.   Assessment & Plan:  DJON TITH Sr. is a 80 y.o. male with acute kidney injury that needs continued dialysis. A right internal jugular tunneled line will be placed. His nose bleed has stopped. He is on contact for shingles on the left leg.  -Discussed tunneled catheter and risk of bleeding, infection, injury to vessel and pneumothorax. My partner also discussed this yesterday.    All questions were answered to the satisfaction of the patient.    Virl Cagey 01/22/2021, 12:32 PM

## 2021-01-22 NOTE — Progress Notes (Signed)
Nephrology Progress Note:   Patient ID: Mark Hose., male   DOB: 07-06-1941, 80 y.o.   MRN: [id number]   S:  He had  1.9 liters uop charted.  He feels well.  Seems a little slow mentally but making it clear that he is frustrated that nothing has been done yet this AM   O:BP (!) 157/79 (BP Location: Right Arm)   Pulse (!) 105   Temp 97.6 F (36.4 C) (Oral)   Resp 18   Ht 5' 8.5" (1.74 m)   Wt 87.2 kg   SpO2 97%   BMI 28.81 kg/m   Intake/Output Summary (Last 24 hours) at 01/22/2021 0857 Last data filed at 01/22/2021 0459 Gross per 24 hour  Intake 686.83 ml  Output 1950 ml  Net -1263.17 ml   Intake/Output: I/O last 3 completed shifts: In: 3644.6 [P.O.:780; I.V.:2864.6] Out: 2950 [Urine:2950]  Intake/Output this shift:  No intake/output data recorded. Weight change: -4.2 kg  Gen elderly male in chair in NAD  HENT NCAT CVS:S1S2 no rub Resp: clear and unlabored on room air  Abd: soft, NT/ND Ext: trace to 1+ bilateral lower extremity edema Psych no anxiety or agitation  Neuro: alert and oriented to person and year. Answers questions more easily today  GU no foley  Recent Labs  Lab 01/16/21 0457 01/17/21 0430 01/18/21 0527 01/19/21 0559 01/20/21 0637 01/21/21 0656 01/22/21 0655  NA 142 141 143 140 140 141 140  K 3.6 3.8 3.0* 3.5 3.7 3.7 4.9  CL 104 105 107 106 107 107 106  CO2 24 23 25 22 22 23  21*  GLUCOSE 100* 126* 98 148* 155* 169* 168*  BUN 128* 114* 112* 110* 113* 122* 104*  CREATININE 4.94* 4.80* 4.90* 4.94* 4.71* 4.53* 4.27*  ALBUMIN 2.2* 2.3* 2.2* 2.1* 2.0* 2.0* 2.1*  CALCIUM 7.3* 7.5* 7.2* 7.2* 7.4* 7.5* 7.6*  PHOS 4.7* 4.6 5.1* 4.2 3.5 3.5 3.5   Liver Function Tests: Recent Labs  Lab 01/20/21 0637 01/21/21 0656 01/22/21 0655  ALBUMIN 2.0* 2.0* 2.1*   No results for input(s): LIPASE, AMYLASE in the last 168 hours. No results for input(s): AMMONIA in the last 168 hours. CBC: Recent Labs  Lab 01/20/21 0637 01/21/21 0656 01/22/21 0655   WBC 11.8* 12.2* 16.7*  HGB 8.9* 9.5* 10.1*  HCT 29.4* 31.5* 34.5*  MCV 96.7 96.6 98.6  PLT 220 205 175   Cardiac Enzymes: No results for input(s): CKTOTAL, CKMB, CKMBINDEX, TROPONINI in the last 168 hours. CBG: Recent Labs  Lab 01/21/21 1236 01/21/21 1721 01/21/21 2002 01/22/21 0325 01/22/21 0720  GLUCAP 258* 244* 288* 138* 171*    Iron Studies: No results for input(s): IRON, TIBC, TRANSFERRIN, FERRITIN in the last 72 hours. Studies/Results: No results found.  alteplase  2 mg Intracatheter Once   alteplase  2 mg Intracatheter Once   aspirin EC  81 mg Oral Daily   calcitRIOL  0.25 mcg Oral Daily   calcium carbonate  800 mg of elemental calcium Oral TID WC   Chlorhexidine Gluconate Cloth  6 each Topical Q0600   cholecalciferol  2,000 Units Oral Daily   darbepoetin (ARANESP) injection - NON-DIALYSIS  100 mcg Subcutaneous Q Tue-1800   diltiazem  180 mg Oral Daily   insulin aspart  0-9 Units Subcutaneous TID WC   insulin aspart  5 Units Subcutaneous TID WC   insulin glargine  8 Units Subcutaneous QHS   lidocaine  1 patch Transdermal Daily   lipase/protease/amylase  12,000 Units Oral  TID AC   mouth rinse  15 mL Mouth Rinse BID   metoprolol tartrate  50 mg Oral BID   pravastatin  10 mg Oral q1800   saccharomyces boulardii  250 mg Oral BID   sodium bicarbonate  1,300 mg Oral BID    BMET    Component Value Date/Time   NA 140 01/22/2021 0655   K 4.9 01/22/2021 0655   CL 106 01/22/2021 0655   CO2 21 (L) 01/22/2021 0655   GLUCOSE 168 (H) 01/22/2021 0655   BUN 104 (H) 01/22/2021 0655   CREATININE 4.27 (H) 01/22/2021 0655   CREATININE 1.77 (H) 12/30/2019 1143   CALCIUM 7.6 (L) 01/22/2021 0655   GFRNONAA 13 (L) 01/22/2021 0655   GFRNONAA 36 (L) 12/30/2019 1143   GFRAA 42 (L) 12/30/2019 1143   CBC    Component Value Date/Time   WBC 16.7 (H) 01/22/2021 0655   RBC 3.50 (L) 01/22/2021 0655   HGB 10.1 (L) 01/22/2021 0655   HCT 34.5 (L) 01/22/2021 0655   PLT 175  01/22/2021 0655   MCV 98.6 01/22/2021 0655   MCH 28.9 01/22/2021 0655   MCHC 29.3 (L) 01/22/2021 0655   RDW 15.4 01/22/2021 0655   LYMPHSABS 0.8 12/23/2020 1153   MONOABS 0.6 12/23/2020 1153   EOSABS 0.1 12/23/2020 1153   BASOSABS 0.0 12/23/2020 1153    Assessment/Plan:   AKI/CKD stage IIIb, non-oliguric - presumably ischemic ATN in setting of volume depletion, hypotension, and covid-19 infection.  Required HD on 12/29/20 -01/08/21 due to azotemia, but did not tolerate last HD well.  HD catheter in place for 2 weeks and this was removed for infection control   Since last HD there has been no meaningful recovery-  BUN over 100 and FTT.  Plan for tunneled catheter procedure today followed by HD-  then will be ongoing   Acute metabolic encephalopathy - fluctuates day to day.  Per charting did not improve with dialysis  HTN - controlled Anemia of acute illness - stable Hgb.  On ESA.  Shingles outbreak - on left leg. Per primary  Hypocalcemia - improved with supplementation. iPTH 256, on calcitriol  H/o covid-19 infection - no longer on isolation protocol for covid but is for shingles outbreak. Disposition - awaiting SNF placement when medically stable.     Louis Meckel, MD 01/22/2021 8:57 AM

## 2021-01-22 NOTE — Anesthesia Preprocedure Evaluation (Addendum)
Anesthesia Evaluation  Patient identified by MRN, date of birth, ID band Patient awake    Reviewed: Allergy & Precautions, NPO status , Patient's Chart, lab work & pertinent test results, reviewed documented beta blocker date and time   History of Anesthesia Complications Negative for: history of anesthetic complications  Airway Mallampati: II  TM Distance: <3 FB Neck ROM: Full    Dental  (+) Caps, Dental Advisory Given   Pulmonary shortness of breath and with exertion,    Pulmonary exam normal        Cardiovascular Exercise Tolerance: Poor hypertension, Pt. on home beta blockers and Pt. on medications + CAD  Normal cardiovascular exam+ dysrhythmias Supra Ventricular Tachycardia + Valvular Problems/Murmurs  Rhythm:Regular Rate:Normal  23-Dec-2020 11:58:44 [facility]-ER ROUTINE RECORD 1940/12/27 (89 yr) Male Caucasian Room:ER15 Loc:499 Technician: MC Test ind: Comment 1:: Comment 2:: Comment 3:: Comment 4:: Vent. rate 79 BPM PR interval 217 ms QRS duration 136 ms QT/QTcB 429/492 ms P-R-T axes 16 -27 19 Sinus rhythm Borderline prolonged PR interval Right bundle branch block Confirmed by Nanda Quinton [id number]) on 12/23/2020 12:41:51 PM  1. Left ventricular ejection fraction, by estimation, is 60 to 65%. The left ventricle has normal function. The left ventricle has no regional wall motion abnormalities. There is mild left ventricular hypertrophy. Left ventricular diastolic parameters are consistent with Grade I diastolic dysfunction (impaired relaxation).  2. Right ventricular systolic function is normal. The right ventricular size is normal. There is normal pulmonary artery systolic pressure.  3. The mitral valve is normal in structure. No evidence of mitral valve  regurgitation. No evidence of mitral stenosis.  4. The aortic valve is tricuspid. Aortic valve regurgitation is not  visualized. Mild aortic  valve sclerosis is present, with no evidence of aortic valve stenosis.  5. Aortic dilatation noted. There is mild dilatation of the aortic root, measuring 39 mm.  6. The inferior vena cava is normal in size with greater than 50% respiratory variability, suggesting right atrial pressure of 3 mmHg.     Neuro/Psych Subdural hematoma  Neuromuscular disease negative psych ROS   GI/Hepatic negative GI ROS, (+) Cirrhosis  (NAFLD)      ,   Endo/Other  diabetes, Well Controlled, Type 2, Insulin Dependent  Renal/GU ESRF and DialysisRenal disease   Prostate cancer    Musculoskeletal  (+) Arthritis ,   Abdominal   Peds  Hematology negative hematology ROS (+)   Anesthesia Other Findings Shingles, Nose bleed this morning  Reproductive/Obstetrics negative OB ROS                           Anesthesia Physical Anesthesia Plan  ASA: 4  Anesthesia Plan: General   Post-op Pain Management:    Induction: Intravenous  PONV Risk Score and Plan: Ondansetron and Propofol infusion  Airway Management Planned: Nasal Cannula, Natural Airway and Simple Face Mask  Additional Equipment:   Intra-op Plan:   Post-operative Plan:   Informed Consent: I have reviewed the patients History and Physical, chart, labs and discussed the procedure including the risks, benefits and alternatives for the proposed anesthesia with the patient or authorized representative who has indicated his/her understanding and acceptance.     Dental advisory given  Plan Discussed with: CRNA and Surgeon  Anesthesia Plan Comments:         Anesthesia Quick Evaluation

## 2021-01-22 NOTE — Anesthesia Postprocedure Evaluation (Signed)
Anesthesia Post Note  Patient: Mark Jumbo Sr.  Procedure(s) Performed: INSERTION OF DIALYSIS CATHETER (Right: Neck)  Patient location during evaluation: PACU Anesthesia Type: General Level of consciousness: awake and alert Pain management: pain level controlled Vital Signs Assessment: post-procedure vital signs reviewed and stable Respiratory status: spontaneous breathing and respiratory function stable Cardiovascular status: blood pressure returned to baseline and stable Postop Assessment: no apparent nausea or vomiting Anesthetic complications: no   No notable events documented.   Last Vitals:  Vitals:   01/22/21 1011 01/22/21 1500  BP: (!) 165/83 (P) 116/63  Pulse: (!) 104 (P) 88  Resp:    Temp:  (!) (P) 35.8 C  SpO2:  (P) 93%    Last Pain:  Vitals:   01/22/21 1500  TempSrc:   PainSc: (P) 0-No pain                 Albertus Chiarelli C Altheria Shadoan

## 2021-01-23 ENCOUNTER — Encounter (HOSPITAL_COMMUNITY): Payer: Self-pay | Admitting: General Surgery

## 2021-01-23 DIAGNOSIS — Z992 Dependence on renal dialysis: Secondary | ICD-10-CM

## 2021-01-23 LAB — RENAL FUNCTION PANEL
Albumin: 1.9 g/dL — ABNORMAL LOW (ref 3.5–5.0)
Anion gap: 8 (ref 5–15)
BUN: 49 mg/dL — ABNORMAL HIGH (ref 8–23)
CO2: 29 mmol/L (ref 22–32)
Calcium: 7.3 mg/dL — ABNORMAL LOW (ref 8.9–10.3)
Chloride: 100 mmol/L (ref 98–111)
Creatinine, Ser: 2.75 mg/dL — ABNORMAL HIGH (ref 0.61–1.24)
GFR, Estimated: 23 mL/min — ABNORMAL LOW (ref >60)
Glucose, Bld: 157 mg/dL — ABNORMAL HIGH (ref 70–99)
Phosphorus: 2.8 mg/dL (ref 2.5–4.6)
Potassium: 3.5 mmol/L (ref 3.5–5.1)
Sodium: 137 mmol/L (ref 135–145)

## 2021-01-23 LAB — GLUCOSE, CAPILLARY
Glucose-Capillary: 132 mg/dL — ABNORMAL HIGH (ref 70–99)
Glucose-Capillary: 162 mg/dL — ABNORMAL HIGH (ref 70–99)
Glucose-Capillary: 163 mg/dL — ABNORMAL HIGH (ref 70–99)
Glucose-Capillary: 222 mg/dL — ABNORMAL HIGH (ref 70–99)
Glucose-Capillary: 243 mg/dL — ABNORMAL HIGH (ref 70–99)
Glucose-Capillary: 225 mg/dL — ABNORMAL HIGH (ref 70–99)

## 2021-01-23 LAB — HEPATITIS B SURFACE ANTIBODY, QUANTITATIVE: Hep B S AB Quant (Post): <3.1 m[IU]/mL — ABNORMAL LOW (ref >9.9)

## 2021-01-23 NOTE — Progress Notes (Signed)
Nephrology Progress Note:   Patient ID: [name]., male   DOB: Jan 28, 1941, 80 y.o.   MRN: [id number]   S:  good uop charted.  Underwent HD yesterday without incident.  He feels well.  Having bleeding from the [facility]-  dressing being changed   O:BP 139/74 (BP Location: Left Arm)   Pulse (!) 101   Temp 99.6 F (37.6 C) (Oral)   Resp 18   Ht 5' 8.5" (1.74 m)   Wt 87.5 kg   SpO2 95%   BMI 28.90 kg/m   Intake/Output Summary (Last 24 hours) at 01/23/2021 0913 Last data filed at 01/22/2021 2120 Gross per 24 hour  Intake 460 ml  Output 1470 ml  Net -1010 ml   Intake/Output: I/O last 3 completed shifts: In: 906.8 [P.O.:660; I.V.:246.8] Out: 2670 [Urine:2150; Other:500; Blood:20]  Intake/Output this shift:  No intake/output data recorded. Weight change: 0.3 kg  Gen elderly male in chair in NAD  HENT NCAT CVS:S1S2 no rub Resp: clear and unlabored on room air  Abd: soft, NT/ND Ext: trace to 1+ bilateral lower extremity edema Psych no anxiety or agitation  Neuro: alert and oriented to person and year. Answers questions more easily today  GU no foley New right sided TDC with bleeding   Recent Labs  Lab 01/17/21 0430 01/18/21 0527 01/19/21 0559 01/20/21 0637 01/21/21 0656 01/22/21 0655 01/23/21 0627  NA 141 143 140 140 141 140 137  K 3.8 3.0* 3.5 3.7 3.7 4.9 3.5  CL 105 107 106 107 107 106 100  CO2 23 25 22 22 23  21* 29  GLUCOSE 126* 98 148* 155* 169* 168* 157*  BUN 114* 112* 110* 113* 122* 104* 49*  CREATININE 4.80* 4.90* 4.94* 4.71* 4.53* 4.27* 2.75*  ALBUMIN 2.3* 2.2* 2.1* 2.0* 2.0* 2.1* 1.9*  CALCIUM 7.5* 7.2* 7.2* 7.4* 7.5* 7.6* 7.3*  PHOS 4.6 5.1* 4.2 3.5 3.5 3.5 2.8   Liver Function Tests: Recent Labs  Lab 01/21/21 0656 01/22/21 0655 01/23/21 0627  ALBUMIN 2.0* 2.1* 1.9*   No results for input(s): LIPASE, AMYLASE in the last 168 hours. No results for input(s): AMMONIA in the last 168 hours. CBC: Recent Labs  Lab 01/20/21 0637 01/21/21 0656  01/22/21 0655  WBC 11.8* 12.2* 16.7*  HGB 8.9* 9.5* 10.1*  HCT 29.4* 31.5* 34.5*  MCV 96.7 96.6 98.6  PLT 220 205 175   Cardiac Enzymes: No results for input(s): CKTOTAL, CKMB, CKMBINDEX, TROPONINI in the last 168 hours. CBG: Recent Labs  Lab 01/22/21 1114 01/22/21 1518 01/22/21 2008 01/23/21 0254 01/23/21 0814  GLUCAP 159* 130* 139* 162* 163*    Iron Studies: No results for input(s): IRON, TIBC, TRANSFERRIN, FERRITIN in the last 72 hours. Studies/Results: DG Chest Port 1 View  Result Date: 01/22/2021 CLINICAL DATA:  Dialysis catheter placement EXAM: PORTABLE CHEST 1 VIEW COMPARISON:  12/29/2020 FINDINGS: Interval placement of large-bore right neck multi lumen vascular catheter, tip projecting near the superior cavoatrial junction. Unchanged elevation of the right hemidiaphragm. No acute appearing airspace opacity. The heart and mediastinum are unremarkable. IMPRESSION: 1. Interval placement of large-bore right neck multi lumen vascular catheter, tip projecting near the superior cavoatrial junction. 2. Unchanged elevation of the right hemidiaphragm. No acute appearing airspace opacity. Electronically Signed   By: Eddie Candle M.D.   On: 01/22/2021 16:23   DG C-Arm 1-60 Min-No Report  Result Date: 01/22/2021 Fluoroscopy was utilized by the requesting physician.  No radiographic interpretation.    alteplase  2 mg Intracatheter  Once   alteplase  2 mg Intracatheter Once   aspirin EC  81 mg Oral Daily   calcitRIOL  0.25 mcg Oral Daily   calcium carbonate  800 mg of elemental calcium Oral TID WC   Chlorhexidine Gluconate Cloth  6 each Topical Q0600   cholecalciferol  2,000 Units Oral Daily   darbepoetin (ARANESP) injection - NON-DIALYSIS  100 mcg Subcutaneous Q Tue-1800   diltiazem  180 mg Oral Daily   insulin aspart  0-9 Units Subcutaneous TID WC   insulin aspart  5 Units Subcutaneous TID WC   insulin glargine  8 Units Subcutaneous QHS   lidocaine  1 patch Transdermal Daily    lipase/protease/amylase  12,000 Units Oral TID AC   mouth rinse  15 mL Mouth Rinse BID   metoprolol tartrate  50 mg Oral BID   pravastatin  10 mg Oral q1800   saccharomyces boulardii  250 mg Oral BID   sodium bicarbonate  1,300 mg Oral BID    BMET    Component Value Date/Time   NA 137 01/23/2021 0627   K 3.5 01/23/2021 0627   CL 100 01/23/2021 0627   CO2 29 01/23/2021 0627   GLUCOSE 157 (H) 01/23/2021 0627   BUN 49 (H) 01/23/2021 0627   CREATININE 2.75 (H) 01/23/2021 0627   CREATININE 1.77 (H) 12/30/2019 1143   CALCIUM 7.3 (L) 01/23/2021 0627   GFRNONAA 23 (L) 01/23/2021 0627   GFRNONAA 36 (L) 12/30/2019 1143   GFRAA 42 (L) 12/30/2019 1143   CBC    Component Value Date/Time   WBC 16.7 (H) 01/22/2021 0655   RBC 3.50 (L) 01/22/2021 0655   HGB 10.1 (L) 01/22/2021 0655   HCT 34.5 (L) 01/22/2021 0655   PLT 175 01/22/2021 0655   MCV 98.6 01/22/2021 0655   MCH 28.9 01/22/2021 0655   MCHC 29.3 (L) 01/22/2021 0655   RDW 15.4 01/22/2021 0655   LYMPHSABS 0.8 12/23/2020 1153   MONOABS 0.6 12/23/2020 1153   EOSABS 0.1 12/23/2020 1153   BASOSABS 0.0 12/23/2020 1153    Assessment/Plan:   AKI/CKD stage IIIb, non-oliguric - presumably ischemic ATN in setting of volume depletion, hypotension, and covid-19 infection.  Required HD on 12/29/20 -01/08/21 due to azotemia, but did not tolerate last HD well.  HD catheter in place for 2 weeks and this was removed for infection control   Since last HD there has been no meaningful recovery, suspect ESRD-  BUN over 100 and FTT.  S/p tunneled catheter procedure 6/20 followed by HD-  then will be ongoing -  plan for next HD treatment tentatively 2/99  Acute metabolic encephalopathy - fluctuates day to day.  HTN - controlled-  cardizem and lopressor Anemia of acute illness - stable Hgb.  On ESA.  Shingles outbreak - on left leg. Per primary  Hypocalcemia - improved with supplementation. iPTH 256, on calcitriol /tums and cholecalciferol H/o covid-19  infection - no longer on isolation protocol for covid but is for shingles outbreak. Disposition - awaiting SNF placement when medically stable. CLIP to OP HD    Louis Meckel, MD 01/23/2021 9:13 AM

## 2021-01-23 NOTE — TOC Progression Note (Addendum)
Transition of Care (TOC) - Progression Note    Patient Details  Name: [name]. MRN: [id number] Date of Birth: May 03, 1941  Transition of Care [facility]) CM/SW Contact  Ihor Gully, LCSW Phone Number: 01/23/2021, 2:39 PM  Clinical Narrative:    Patient authorized for 7days. Auth #[id number]. EMS previously approved auth [id number]. Patient has 5 business days from today to admit to facility (6/27 by 5:00 p.m.).  Debbie at [facility] continues to hold bed for patient in anticipation for discharge in 1-2 days.   Expected Discharge Plan: Skilled Nursing Facility Barriers to Discharge: Continued Medical Work up  Expected Discharge Plan and Services Expected Discharge Plan: [place] Choice: [place] arrangements for the past 2 months: Single Family Home                                       Social Determinants of Health (SDOH) Interventions    Readmission Risk Interventions Readmission Risk Prevention Plan 12/04/2018  Transportation Screening Complete  PCP or Specialist Appt within 3-5 Days (No Data)  [place] or [place] Complete  Social Work Consult for [place] Planning/Counseling Complete  Palliative Care Screening Not Applicable  Medication Review Press photographer) Complete  Some recent data might be hidden

## 2021-01-23 NOTE — Progress Notes (Signed)
PROGRESS NOTE   [name]  OAC:[id number] DOB: 1941-04-02 DOA: 12/23/2020 PCP: [name], MD   Chief Complaint  Patient presents with   Weakness   Level of care: Med-Surg  Brief Admission History:  80 year old gentleman with type 2 diabetes mellitus, stage IIIb CKD, dyslipidemia, hypertension, diastolic congestive heart failure and coronary artery disease presented with increasing weakness and confusion.  He was recently diagnosed with COVID-19 on 12/09/2020.  He was admitted with acute kidney injury. --Right IJ PermCath placed for HD on 01/22/2021 -May go to SNF rehab after clipping process is completed  Assessment & Plan:   Active Problems:   AKI (acute kidney injury) ([place])   Acute renal failure superimposed on stage 3b chronic kidney disease (HCC)   Acute metabolic encephalopathy   COVID-19 virus infection   Metabolic acidosis   SVT (supraventricular tachycardia) (HCC)   Pressure injury of skin   Shingles  AKI on CKD stage IIIb-voiding well, nonoliguric - -Suspect patient had ischemic ATN due to hypotension and dehydration/volume depletion -Started on HD on 12/29/2020 due to azotemia, last HD session was 01/08/2021 -Overall mentation and overall Renal function improved with hemodialysis  left internal jugular HD catheter in place since 12/29/2020--was removed on 01/15/21 -As per nephrologist restarting hemodialysis on 01/22/21 given ongoing episodes of confusion and rising BUN ---Right IJ PermCath placed for HD on 01/22/2021 -May go to SNF rehab after clipping process is completed and all shingle lesions has crusted.  Acute shingles outbreak - involving the left foot and leg in a dermatomal pattern.  -Patient received treatment with Valtrex for 7 days -Currently with almost complete crusting of all lesions. -Continue airborne precaution secondary to shingles infection.  Acute metabolic encephalopathy-much improved, still has occasional episodes of confusion and  hallucinations; especially in between dialysis treatments. -Continue constant reorientation and follow response.  Hypocalcemia/hypokalemia/hypomagnesemia-replaced, c/n Tums and vitamin D.  Status post calcium gluconate on 01/05/2021. -Continue to follow electrolytes trend.  Anemia in CKD- he was transfused 2 units PRBC on 01/07/2021. -ESA/Epogen per nephrology team -Transfuse as clinically indicated  DKA-has been resolved and no longer an issue.  Uncontrolled type 2 diabetes mellitus with renal complications-continue Lantus insulin 8 units nightly, prandial insulin coverage adjusted  -Continue to follow CBGs and further adjust hypoglycemic regimen as needed.  CBG (last 3)  Recent Labs    01/23/21 0814 01/23/21 1126 01/23/21 1623  GLUCAP 163* 222* 243*    Recent COVID-19 infection- patient status post 3 days of remdesivir for asymptomatic infection.  He has been stable on room air.  He is now over  30 days post diagnosis and no longer in quarantine.  Thrombocytopenia-resolved.  SVT/A. fib with RVR- Cardizem and Lopressor as ordered by cardiology service.   -Increased cardizem to 180 mg per cardio recs. Continue metoprolol 50 mg BID.   Coronary artery disease- no chest pain symptoms at this time.   -Continue aspirin, pravastatin and metoprolol -Continue heart healthy diet.  Resting tremor - Possibly related to severe low Mg levels, seems to be improving, follow closely.    -Consider outpatient neurology follow-up if persistent despite hemodialysis  Essential hypertension- stable on current medications. -Continue heart healthy/renal diet.  Aortic root dilatation -Continue outpatient follow-up with cardiology service.    Leukocytosis noted on 01/20/2021, no fevers no evidence of new infection per se -Leukocytosis persist ---may need outpatient hematology follow-up -Continue intermittent monitoring.  DVT prophylaxis: SCDs Code Status: Full  Family Communication: wife updated  at bedside 01/23/2021.  Disposition: SNF  Status is: Inpatient  Remains inpatient appropriate because: --Right IJ PermCath placed for HD on 01/22/2021 --Right IJ PermCath placed for HD on 01/22/2021 -May go to SNF rehab after clipping process is completed  Dispo: The patient is from: Home              Anticipated d/c is to: SNF--- apparently SNF will not be able to take patient until his shingles lesions have crusted over, also --Right IJ PermCath placed for HD on 01/22/2021 -May go to SNF rehab after clipping process is completed              Patient currently is medically stable to d/c.   Difficult to place patient No   Consultants:  Cardiology Nephrology  Gen surgery for HD cath placement  Procedures:  --Right IJ PermCath placed for HD on 01/22/2021   Antimicrobials:   Valtrex 01/14/21>>  Subjective: Afebrile, no chest pain, no nausea, no vomiting.  Oriented x2, following commands appropriately; in no distress.  Objective: Vitals:   01/22/21 2100 01/22/21 2120 01/22/21 2130 01/23/21 0539  BP: 108/83 112/78 124/71 139/74  Pulse: 77 83 90 (!) 101  Resp:   16 18  Temp:   97.9 F (36.6 C) 99.6 F (37.6 C)  TempSrc:   Oral Oral  SpO2:    95%  Weight:      Height:        Intake/Output Summary (Last 24 hours) at 01/23/2021 1904 Last data filed at 01/22/2021 2120 Gross per 24 hour  Intake --  Output 500 ml  Net -500 ml   Filed Weights   01/21/21 0506 01/22/21 0500 01/22/21 1740  Weight: 91.4 kg 87.2 kg 87.5 kg   Examination: General exam: Alert, awake, oriented x 2; following commands appropriately; in no acute distress.  Reports no shortness of breath, no chest pain, no nausea, no vomiting. Respiratory system: Clear to auscultation. Respiratory effort normal.  Good saturation on room air. Cardiovascular system:RRR. No murmurs, rubs, gallops.  No JVD. Gastrointestinal system: Abdomen is nondistended, soft and nontender. No organomegaly or masses felt. Normal bowel  sounds heard. Central nervous system: Alert and oriented. No focal neurological deficits. Extremities/skin: No cyanosis or clubbing; 1+ edema appreciated bilaterally.  Left lower extremity with positive herpes zoster lesions almost completely crusted.  No petechiae appreciated.  Right internal hemodialysis catheter demonstrating ongoing oozing/bleeding. Psychiatry: Mood and affect appropriate.  Baseline cognitive deficits appreciated today.  Data Reviewed: I have personally reviewed following labs and imaging studies  CBC: Recent Labs  Lab 01/20/21 0637 01/21/21 0656 01/22/21 0655  WBC 11.8* 12.2* 16.7*  HGB 8.9* 9.5* 10.1*  HCT 29.4* 31.5* 34.5*  MCV 96.7 96.6 98.6  PLT 220 205 284    Basic Metabolic Panel: Recent Labs  Lab 01/19/21 0559 01/20/21 0637 01/21/21 0656 01/22/21 0655 01/23/21 0627  NA 140 140 141 140 137  K 3.5 3.7 3.7 4.9 3.5  CL 106 107 107 106 100  CO2 22 22 23  21* 29  GLUCOSE 148* 155* 169* 168* 157*  BUN 110* 113* 122* 104* 49*  CREATININE 4.94* 4.71* 4.53* 4.27* 2.75*  CALCIUM 7.2* 7.4* 7.5* 7.6* 7.3*  PHOS 4.2 3.5 3.5 3.5 2.8    GFR: Estimated Creatinine Clearance: 23.7 mL/min (A) (by C-G formula based on SCr of 2.75 mg/dL (H)).  Liver Function Tests: Recent Labs  Lab 01/19/21 0559 01/20/21 1324 01/21/21 0656 01/22/21 0655 01/23/21 0627  ALBUMIN 2.1* 2.0* 2.0* 2.1* 1.9*    CBG:  Recent Labs  Lab 01/22/21 2008 01/23/21 0254 01/23/21 0814 01/23/21 1126 01/23/21 1623  GLUCAP 139* 162* 163* 222* 243*    Recent Results (from the past 240 hour(s))  Surgical pcr screen     Status: None   Collection Time: 01/21/21  9:21 PM   Specimen: Nasal Mucosa; Nasal Swab  Result Value Ref Range Status   MRSA, PCR NEGATIVE NEGATIVE Final   Staphylococcus aureus NEGATIVE NEGATIVE Final    Comment: (NOTE) The Xpert SA Assay (FDA approved for NASAL specimens in patients 59 years of age and older), is one component of a comprehensive surveillance  program. It is not intended to diagnose infection nor to guide or monitor treatment. Performed at [facility], [address] [place], [place] [address]       Radiology Studies: [facility] 1 View  Result Date: 01/22/2021 CLINICAL DATA:  Dialysis catheter placement EXAM: PORTABLE CHEST 1 VIEW COMPARISON:  12/29/2020 FINDINGS: Interval placement of large-bore right neck multi lumen vascular catheter, tip projecting near the superior cavoatrial junction. Unchanged elevation of the right hemidiaphragm. No acute appearing airspace opacity. The heart and mediastinum are unremarkable. IMPRESSION: 1. Interval placement of large-bore right neck multi lumen vascular catheter, tip projecting near the superior cavoatrial junction. 2. Unchanged elevation of the right hemidiaphragm. No acute appearing airspace opacity. Electronically Signed   By: Eddie Candle M.D.   On: 01/22/2021 16:23   DG C-Arm 1-60 Min-No Report  Result Date: 01/22/2021 Fluoroscopy was utilized by the requesting physician.  No radiographic interpretation.    Scheduled Meds:  alteplase  2 mg Intracatheter Once   alteplase  2 mg Intracatheter Once   aspirin EC  81 mg Oral Daily   calcitRIOL  0.25 mcg Oral Daily   calcium carbonate  800 mg of elemental calcium Oral TID WC   Chlorhexidine Gluconate Cloth  6 each Topical Q0600   cholecalciferol  2,000 Units Oral Daily   darbepoetin (ARANESP) injection - NON-DIALYSIS  100 mcg Subcutaneous Q Tue-1800   diltiazem  180 mg Oral Daily   insulin aspart  0-9 Units Subcutaneous TID WC   insulin aspart  5 Units Subcutaneous TID WC   insulin glargine  8 Units Subcutaneous QHS   lidocaine  1 patch Transdermal Daily   lipase/protease/amylase  12,000 Units Oral TID AC   mouth rinse  15 mL Mouth Rinse BID   metoprolol tartrate  50 mg Oral BID   pravastatin  10 mg Oral q1800   saccharomyces boulardii  250 mg Oral BID   sodium bicarbonate  1,300 mg Oral BID   Continuous Infusions:  sodium  chloride     sodium chloride     sodium chloride 0 mL/hr at 01/22/21 0045    Barton Dubois, MD How to contact the [facility] Attending or Consulting provider [place] or covering provider during after hours [place], for this patient?  Check the care team in [facility] and look for a) attending/consulting [facility] provider listed and b) the [facility] team listed Log into [email or web address] and use [place]'s universal password to access. If you do not have the password, please contact the hospital operator. Locate the [facility] provider you are looking for under [facility] and page to a number that you can be directly reached. If you still have difficulty reaching the provider, please page the [facility] (Director on Call) for the Hospitalists listed on amion for assistance.  01/23/2021, 7:04 PM

## 2021-01-23 NOTE — Progress Notes (Signed)
Physical Therapy Treatment Patient Details Name: Mark KLINGEL Sr. MRN: [id number] DOB: 06-07-1941 Today's Date: 01/23/2021    History of Present Illness Mark L Arutyunyan Sr. is a 80 y.o. male with medical history significant for type 2 diabetes, CKD stage IIIb, hypertension, dyslipidemia, diastolic CHF, and CAD described to the ED via EMS with increasing weakness and confusion along with poor appetite for the last 2 to 3 days.  Symptoms have been present over the past week and patient was diagnosed with COVID-19 infection on May 7.  He saw his PCP on 5/19 for confusion and was not noted to be hypoxemic at that time.  He was told to hold his Lasix and encouraged to drink plenty of fluids, but he really has not been able to drink very much fluids according to the wife at the bedside.  He was started on some mild delirium related to his COVID infection.  Unfortunately, his symptoms have progressed significantly to days.  Patient cannot give any further history at this time given his severe confusion.  Primary historian his wife at bedside.    PT Comments    Patient agreeable for therapy.  Patient demonstrates slow labored movement for sitting up at bedside having to use bed rail and Min assist to pull self to sitting,  good carryover for completing BLE ROM/strengthening exercises with verbal cues and demonstration, increased endurance/distance for taking steps at bedside without loss of balance and limited mostly due to fatigue.  Patient tolerated sitting up in chair after therapy - nursing staff notified.  Patient will benefit from continued physical therapy in hospital and recommended venue below to increase strength, balance, endurance for safe ADLs and gait.       Follow Up Recommendations  SNF;Supervision - Intermittent;Supervision for mobility/OOB     Equipment Recommendations  None recommended by PT    Recommendations for Other Services       Precautions / Restrictions  Precautions Precautions: Fall Restrictions Weight Bearing Restrictions: No    Mobility  Bed Mobility Overal bed mobility: Needs Assistance Bed Mobility: Rolling;Sidelying to Sit Rolling: Supervision;Min guard Sidelying to sit: Min assist       General bed mobility comments: increased time, labored movement    Transfers Overall transfer level: Needs assistance Equipment used: Rolling walker (2 wheeled) Transfers: Sit to/from [facility] Sit to Stand: Mod assist Stand pivot transfers: Min assist       General transfer comment: slow labored movement, has diffiuclty completing sit to stands due to BLE weakness  Ambulation/Gait Ambulation/Gait assistance: Min assist Gait Distance (Feet): 20 Feet Assistive device: Rolling walker (2 wheeled) Gait Pattern/deviations: Decreased step length - right;Decreased step length - left;Decreased stride length Gait velocity: decreased   General Gait Details: slow labored movement without loss of balance while taking side steps and forwad/backwards   Stairs             Wheelchair Mobility    Modified Rankin (Stroke Patients Only)       Balance Overall balance assessment: Needs assistance Sitting-balance support: Feet supported;No upper extremity supported Sitting balance-Leahy Scale: Good Sitting balance - Comments: seated at EOB   Standing balance support: During functional activity;Bilateral upper extremity supported Standing balance-Leahy Scale: Fair Standing balance comment: using RW                            Cognition Arousal/Alertness: Awake/alert Behavior During Therapy: WFL for tasks assessed/performed Overall Cognitive Status: Within  Functional Limits for tasks assessed                                        Exercises General Exercises - Lower Extremity Long Arc Quad: AROM;Strengthening;Seated;Both;10 reps Hip ABduction/ADduction:  Seated;AROM;Strengthening;Both;5 reps Hip Flexion/Marching: AROM;Seated;Strengthening;Both;10 reps Toe Raises: AROM;Seated;Strengthening;Both;15 reps Heel Raises: AROM;Seated;Strengthening;Both;15 reps    General Comments        Pertinent Vitals/Pain Pain Assessment: No/denies pain    Home Living                      Prior Function            PT Goals (current goals can now be found in the care plan section) Acute Rehab PT Goals Patient Stated Goal: return home after rehab PT Goal Formulation: With patient/family Time For Goal Achievement: 01/30/21 Potential to Achieve Goals: Good Progress towards PT goals: Progressing toward goals    Frequency    Min 3X/week      PT Plan Current plan remains appropriate    Co-evaluation              AM-PAC PT "6 Clicks" Mobility   Outcome Measure  Help needed turning from your back to your side while in a flat bed without using bedrails?: A Little Help needed moving from lying on your back to sitting on the side of a flat bed without using bedrails?: A Little Help needed moving to and from a bed to a chair (including a wheelchair)?: A Little Help needed standing up from a chair using your arms (e.g., wheelchair or bedside chair)?: A Lot Help needed to walk in hospital room?: A Lot Help needed climbing 3-5 steps with a railing? : A Lot 6 Click Score: 15    End of Session   Activity Tolerance: Patient tolerated treatment well;Patient limited by fatigue Patient left: in chair;with call bell/phone within reach Nurse Communication: Mobility status PT Visit Diagnosis: Unsteadiness on feet (R26.81);Other abnormalities of gait and mobility (R26.89);Muscle weakness (generalized) (M62.81)     Time: 2376-2831 PT Time Calculation (min) (ACUTE ONLY): 26 min  Charges:  $Therapeutic Exercise: 8-22 mins $Therapeutic Activity: 8-22 mins                     3:47 PM, 01/23/21 [name], MPT Physical Therapist with  [facility] 336 [phone] office [phone] mobile phone

## 2021-01-24 LAB — GLUCOSE, CAPILLARY
Glucose-Capillary: 115 mg/dL — ABNORMAL HIGH (ref 70–99)
Glucose-Capillary: 114 mg/dL — ABNORMAL HIGH (ref 70–99)
Glucose-Capillary: 263 mg/dL — ABNORMAL HIGH (ref 70–99)
Glucose-Capillary: 269 mg/dL — ABNORMAL HIGH (ref 70–99)

## 2021-01-24 LAB — RENAL FUNCTION PANEL
Albumin: 2 g/dL — ABNORMAL LOW (ref 3.5–5.0)
Anion gap: 9 (ref 5–15)
BUN: 65 mg/dL — ABNORMAL HIGH (ref 8–23)
CO2: 28 mmol/L (ref 22–32)
Calcium: 7.3 mg/dL — ABNORMAL LOW (ref 8.9–10.3)
Chloride: 103 mmol/L (ref 98–111)
Creatinine, Ser: 3.61 mg/dL — ABNORMAL HIGH (ref 0.61–1.24)
GFR, Estimated: 16 mL/min — ABNORMAL LOW (ref >60)
Glucose, Bld: 127 mg/dL — ABNORMAL HIGH (ref 70–99)
Phosphorus: 3.5 mg/dL (ref 2.5–4.6)
Potassium: 3.5 mmol/L (ref 3.5–5.1)
Sodium: 140 mmol/L (ref 135–145)

## 2021-01-24 MED ORDER — CHLORHEXIDINE GLUCONATE CLOTH 2 % EX PADS
6.0000 | MEDICATED_PAD | Freq: Every day | CUTANEOUS | Status: DC
  Administered 2021-01-24 – 2021-01-25 (×2): 6 via TOPICAL

## 2021-01-24 NOTE — Progress Notes (Signed)
PROGRESS NOTE   [name]  HXT:[id number] DOB: 1940/10/29 DOA: 12/23/2020 PCP: [name], MD   Chief Complaint  Patient presents with   Weakness   Level of care: Med-Surg  Brief Admission History:  80 year old gentleman with type 2 diabetes mellitus, stage IIIb CKD, dyslipidemia, hypertension, diastolic congestive heart failure and coronary artery disease presented with increasing weakness and confusion.  He was recently diagnosed with COVID-19 on 12/09/2020.  He was admitted with acute kidney injury. --Right IJ PermCath placed for HD on 01/22/2021 -May go to SNF rehab after clipping process is completed  Assessment & Plan:   Active Problems:   AKI (acute kidney injury) ([place])   Acute renal failure superimposed on stage 3b chronic kidney disease (HCC)   Acute metabolic encephalopathy   COVID-19 virus infection   Metabolic acidosis   SVT (supraventricular tachycardia) (HCC)   Pressure injury of skin   Shingles  AKI on CKD stage IIIb-voiding well, nonoliguric - -Suspect patient had ischemic ATN due to hypotension and dehydration/volume depletion -Started on HD on 12/29/2020 due to azotemia, last HD session was 01/08/2021 -Overall mentation and overall Renal function improved with hemodialysis  left internal jugular HD catheter in place since 12/29/2020--was removed on 01/15/21 -As per nephrologist restarted on hemodialysis on 01/22/21 given ongoing episodes of confusion and rising BUN.   -Next hemodialysis treatment to be determined by nephrology service. -Right IJ PermCath placed for HD on 01/22/2021; not longer oozing or bleeding appreciated. -May go to SNF rehab after clipping process is completed and all shingle lesions has crusted.  Acute shingles outbreak - involving the left foot and leg in a dermatomal pattern.  -Patient received treatment with Valtrex for 7 days -Currently all lesions have crusted and healed. -No longer requiring airborne  isolation/precaution.  Acute metabolic encephalopathy-much improved, still has occasional episodes of confusion and hallucinations; especially in between dialysis treatments. -Continue constant reorientation and follow response. -patient is very hard of hearing and with hearing aids in place might suggest confusion state as unable to answer question correctly.  Hypocalcemia/hypokalemia/hypomagnesemia-replaced, c/n Tums and vitamin D.  Status post calcium gluconate on 01/05/2021. -Continue to follow electrolytes trend.  Anemia in CKD- he was transfused 2 units PRBC on 01/07/2021. -ESA/Epogen per nephrology team -Transfuse as clinically indicated  DKA-has been resolved and no longer an issue.  Uncontrolled type 2 diabetes mellitus with renal complications-continue Lantus insulin 8 units nightly, prandial insulin coverage adjusted  -Continue to follow CBGs and further adjust hypoglycemic regimen as needed.  CBG (last 3)  Recent Labs    01/24/21 0303 01/24/21 0737 01/24/21 1156  GLUCAP 115* 114* 263*    Recent COVID-19 infection- patient status post 3 days of remdesivir for asymptomatic infection.  He has been stable on room air.  He is now over  30 days post diagnosis and no longer in quarantine.  Thrombocytopenia-resolved. -Continue to check platelet count intermittently.  SVT/A. fib with RVR- Cardizem and Lopressor as ordered by cardiology service.   -Increased cardizem to 180 mg per cardio recs. Continue metoprolol 50 mg BID.  -Seen outpatient follow-up with cardiology service.  Coronary artery disease- no chest pain symptoms at this time.   -Continue aspirin, pravastatin and metoprolol -Continue heart healthy diet.  Resting tremor - Possibly related to severe low Mg levels, seems to be improving, follow closely.    -Consider outpatient neurology follow-up if persistent despite hemodialysis  Essential hypertension- stable on current medications. -Continue heart healthy/renal  diet.  Aortic root  dilatation -Continue outpatient follow-up with cardiology service.    Leukocytosis noted on 01/20/2021, no fevers no evidence of new infection per se -Leukocytosis persist ---may need outpatient hematology follow-up -Continue intermittent monitoring.  DVT prophylaxis: SCDs Code Status: Full  Family Communication: wife updated at bedside 01/23/2021.   Disposition: SNF  Status is: Inpatient  Remains inpatient appropriate because: --Right IJ PermCath placed for HD on 01/22/2021 --Right IJ PermCath placed for HD on 01/22/2021 -May go to SNF rehab after clipping process is completed  Dispo: The patient is from: Home              Anticipated d/c is to: SNF--- apparently SNF will not be able to take patient until his shingles lesions have crusted over, also --Right IJ PermCath placed for HD on 01/22/2021 -May go to SNF rehab after clipping process is completed              Patient currently is medically stable to d/c.   Difficult to place patient No   Consultants:  Cardiology Nephrology  Gen surgery for HD cath placement  Procedures:  --Right IJ PermCath placed for HD on 01/22/2021   Antimicrobials:   Valtrex 01/14/21>>  Subjective: No fever, no chest pain, no nausea, no vomiting.  Oriented x3, following commands appropriately.  And in no acute distress.  Objective: Vitals:   01/24/21 1342 01/24/21 1520 01/24/21 1530 01/24/21 1545  BP: (!) 148/66 (!) 147/73 (!) 144/72 125/72  Pulse: 84 97 85 82  Resp: 17 16    Temp: 99.1 F (37.3 C)     TempSrc: Oral     SpO2: 96% 97%    Weight:      Height:        Intake/Output Summary (Last 24 hours) at 01/24/2021 1600 Last data filed at 01/24/2021 1300 Gross per 24 hour  Intake 720 ml  Output --  Net 720 ml   Filed Weights   01/21/21 0506 01/22/21 0500 01/22/21 1740  Weight: 91.4 kg 87.2 kg 87.5 kg   Examination: General exam: Alert, awake, oriented x 3; following commands  Respiratory system: Clear to  auscultation. Respiratory effort normal. Cardiovascular system:RRR. No murmurs, rubs, gallops. Gastrointestinal system: Abdomen is nondistended, soft and nontender. No organomegaly or masses felt. Normal bowel sounds heard. Central nervous system: Alert and oriented. No focal neurological deficits. Extremities: No cyanosis or clubbing; trace edema appreciated bilaterally. Skin: Herpes zoster rash demonstrating completely crusted lesions and no signs of superimposed infection. Psychiatry: Judgement and insight appear normal. Mood & affect appropriate.   Data Reviewed: I have personally reviewed following labs and imaging studies  CBC: Recent Labs  Lab 01/20/21 0637 01/21/21 0656 01/22/21 0655  WBC 11.8* 12.2* 16.7*  HGB 8.9* 9.5* 10.1*  HCT 29.4* 31.5* 34.5*  MCV 96.7 96.6 98.6  PLT 220 205 244    Basic Metabolic Panel: Recent Labs  Lab 01/20/21 0637 01/21/21 0656 01/22/21 0655 01/23/21 0627 01/24/21 0606  NA 140 141 140 137 140  K 3.7 3.7 4.9 3.5 3.5  CL 107 107 106 100 103  CO2 22 23 21* 29 28  GLUCOSE 155* 169* 168* 157* 127*  BUN 113* 122* 104* 49* 65*  CREATININE 4.71* 4.53* 4.27* 2.75* 3.61*  CALCIUM 7.4* 7.5* 7.6* 7.3* 7.3*  PHOS 3.5 3.5 3.5 2.8 3.5    GFR: Estimated Creatinine Clearance: 18 mL/min (A) (by C-G formula based on SCr of 3.61 mg/dL (H)).  Liver Function Tests: Recent Labs  Lab 01/20/21 [phone] 01/21/21 [phone]  01/22/21 0655 01/23/21 0627 01/24/21 0606  ALBUMIN 2.0* 2.0* 2.1* 1.9* 2.0*    CBG: Recent Labs  Lab 01/23/21 1623 01/23/21 2139 01/24/21 0303 01/24/21 0737 01/24/21 1156  GLUCAP 243* 225* 115* 114* 263*    Recent Results (from the past 240 hour(s))  Surgical pcr screen     Status: None   Collection Time: 01/21/21  9:21 PM   Specimen: Nasal Mucosa; Nasal Swab  Result Value Ref Range Status   MRSA, PCR NEGATIVE NEGATIVE Final   Staphylococcus aureus NEGATIVE NEGATIVE Final    Comment: (NOTE) The Xpert SA Assay (FDA approved  for NASAL specimens in patients 60 years of age and older), is one component of a comprehensive surveillance program. It is not intended to diagnose infection nor to guide or monitor treatment. Performed at [facility], [address] [place], [place] [address]     Radiology Studies: [facility] Chest [facility] 1 View  Result Date: 01/22/2021 CLINICAL DATA:  Dialysis catheter placement EXAM: PORTABLE CHEST 1 VIEW COMPARISON:  12/29/2020 FINDINGS: Interval placement of large-bore right neck multi lumen vascular catheter, tip projecting near the superior cavoatrial junction. Unchanged elevation of the right hemidiaphragm. No acute appearing airspace opacity. The heart and mediastinum are unremarkable. IMPRESSION: 1. Interval placement of large-bore right neck multi lumen vascular catheter, tip projecting near the superior cavoatrial junction. 2. Unchanged elevation of the right hemidiaphragm. No acute appearing airspace opacity. Electronically Signed   By: Eddie Candle M.D.   On: 01/22/2021 16:23    Scheduled Meds:  alteplase  2 mg Intracatheter Once   alteplase  2 mg Intracatheter Once   aspirin EC  81 mg Oral Daily   calcitRIOL  0.25 mcg Oral Daily   calcium carbonate  800 mg of elemental calcium Oral TID WC   Chlorhexidine Gluconate Cloth  6 each Topical Q0600   Chlorhexidine Gluconate Cloth  6 each Topical Q0600   cholecalciferol  2,000 Units Oral Daily   darbepoetin (ARANESP) injection - NON-DIALYSIS  100 mcg Subcutaneous Q Tue-1800   diltiazem  180 mg Oral Daily   insulin aspart  0-9 Units Subcutaneous TID WC   insulin aspart  5 Units Subcutaneous TID WC   insulin glargine  8 Units Subcutaneous QHS   lidocaine  1 patch Transdermal Daily   lipase/protease/amylase  12,000 Units Oral TID AC   mouth rinse  15 mL Mouth Rinse BID   metoprolol tartrate  50 mg Oral BID   pravastatin  10 mg Oral q1800   saccharomyces boulardii  250 mg Oral BID   sodium bicarbonate  1,300 mg Oral BID   Continuous  Infusions:  sodium chloride     sodium chloride     sodium chloride 0 mL/hr at 01/22/21 0045    Barton Dubois, MD How to contact the [facility] Attending or Consulting provider [place] or covering provider during after hours [place], for this patient?  Check the care team in [facility] and look for a) attending/consulting TR[facility] provider listed and b) the [facility] team listed Log into [email or web address] and use [place]'s universal password to access. If you do not have the password, please contact the hospital operator. Locate the [facility] provider you are looking for under [facility] and page to a number that you can be directly reached. If you still have difficulty reaching the provider, please page the [facility] (Director on Call) for the Hospitalists listed on amion for assistance.  01/24/2021, 4:00 PM

## 2021-01-24 NOTE — Procedures (Signed)
   HEMODIALYSIS TREATMENT NOTE:  Uneventful 3.5 hour heparin-free treatment completed.  LIJ entry site with old sanguinous drainage.  Dressing was changed pre-HD and no bleeding was seen on HD.  Goal met: 2 liters removed without interruption in UF.  All blood was returned.  No changes from pre-HD assessment.  Rockwell Alexandria, RN

## 2021-01-24 NOTE — Progress Notes (Signed)
Nephrology Progress Note:   Patient ID: Mark Hose., male   DOB: 05-Sep-1940, 80 y.o.   MRN: [id number]   S:   No new issues -  has been working with PT   O:BP (!) 108/36 (BP Location: Left Arm)   Pulse 92   Temp 98.7 F (37.1 C) (Oral)   Resp 18   Ht 5' 8.5" (1.74 m)   Wt 87.5 kg   SpO2 100%   BMI 28.90 kg/m  No intake or output data in the 24 hours ending 01/24/21 0803  Intake/Output: I/O last 3 completed shifts: In: -  Out: 500 [Other:500]  Intake/Output this shift:  No intake/output data recorded. Weight change:   Gen elderly male ,  NAD -  HOH HENT NCAT CVS:S1S2 no rub Resp: clear and unlabored on room air  Abd: soft, NT/ND Ext: trace to 1+ bilateral lower extremity edema Psych no anxiety or agitation  Neuro: alert and oriented to person and year. Answers questions more easily today  GU no foley New right sided TDC with bleeding -  better ?   Recent Labs  Lab 01/18/21 0527 01/19/21 0559 01/20/21 0637 01/21/21 0656 01/22/21 0655 01/23/21 0627 01/24/21 0606  NA 143 140 140 141 140 137 140  K 3.0* 3.5 3.7 3.7 4.9 3.5 3.5  CL 107 106 107 107 106 100 103  CO2 25 22 22 23  21* 29 28  GLUCOSE 98 148* 155* 169* 168* 157* 127*  BUN 112* 110* 113* 122* 104* 49* 65*  CREATININE 4.90* 4.94* 4.71* 4.53* 4.27* 2.75* 3.61*  ALBUMIN 2.2* 2.1* 2.0* 2.0* 2.1* 1.9* 2.0*  CALCIUM 7.2* 7.2* 7.4* 7.5* 7.6* 7.3* 7.3*  PHOS 5.1* 4.2 3.5 3.5 3.5 2.8 3.5   Liver Function Tests: Recent Labs  Lab 01/22/21 0655 01/23/21 0627 01/24/21 0606  ALBUMIN 2.1* 1.9* 2.0*   No results for input(s): LIPASE, AMYLASE in the last 168 hours. No results for input(s): AMMONIA in the last 168 hours. CBC: Recent Labs  Lab 01/20/21 0637 01/21/21 0656 01/22/21 0655  WBC 11.8* 12.2* 16.7*  HGB 8.9* 9.5* 10.1*  HCT 29.4* 31.5* 34.5*  MCV 96.7 96.6 98.6  PLT 220 205 175   Cardiac Enzymes: No results for input(s): CKTOTAL, CKMB, CKMBINDEX, TROPONINI in the last 168  hours. CBG: Recent Labs  Lab 01/23/21 1126 01/23/21 1623 01/23/21 2139 01/24/21 0303 01/24/21 0737  GLUCAP 222* 243* 225* 115* 114*    Iron Studies: No results for input(s): IRON, TIBC, TRANSFERRIN, FERRITIN in the last 72 hours. Studies/Results: DG Chest Port 1 View  Result Date: 01/22/2021 CLINICAL DATA:  Dialysis catheter placement EXAM: PORTABLE CHEST 1 VIEW COMPARISON:  12/29/2020 FINDINGS: Interval placement of large-bore right neck multi lumen vascular catheter, tip projecting near the superior cavoatrial junction. Unchanged elevation of the right hemidiaphragm. No acute appearing airspace opacity. The heart and mediastinum are unremarkable. IMPRESSION: 1. Interval placement of large-bore right neck multi lumen vascular catheter, tip projecting near the superior cavoatrial junction. 2. Unchanged elevation of the right hemidiaphragm. No acute appearing airspace opacity. Electronically Signed   By: Eddie Candle M.D.   On: 01/22/2021 16:23   DG C-Arm 1-60 Min-No Report  Result Date: 01/22/2021 Fluoroscopy was utilized by the requesting physician.  No radiographic interpretation.    alteplase  2 mg Intracatheter Once   alteplase  2 mg Intracatheter Once   aspirin EC  81 mg Oral Daily   calcitRIOL  0.25 mcg Oral Daily   calcium carbonate  800 mg of elemental calcium Oral TID WC   Chlorhexidine Gluconate Cloth  6 each Topical Q0600   cholecalciferol  2,000 Units Oral Daily   darbepoetin (ARANESP) injection - NON-DIALYSIS  100 mcg Subcutaneous Q Tue-1800   diltiazem  180 mg Oral Daily   insulin aspart  0-9 Units Subcutaneous TID WC   insulin aspart  5 Units Subcutaneous TID WC   insulin glargine  8 Units Subcutaneous QHS   lidocaine  1 patch Transdermal Daily   lipase/protease/amylase  12,000 Units Oral TID AC   mouth rinse  15 mL Mouth Rinse BID   metoprolol tartrate  50 mg Oral BID   pravastatin  10 mg Oral q1800   saccharomyces boulardii  250 mg Oral BID   sodium bicarbonate   1,300 mg Oral BID    BMET    Component Value Date/Time   NA 140 01/24/2021 0606   K 3.5 01/24/2021 0606   CL 103 01/24/2021 0606   CO2 28 01/24/2021 0606   GLUCOSE 127 (H) 01/24/2021 0606   BUN 65 (H) 01/24/2021 0606   CREATININE 3.61 (H) 01/24/2021 0606   CREATININE 1.77 (H) 12/30/2019 1143   CALCIUM 7.3 (L) 01/24/2021 0606   GFRNONAA 16 (L) 01/24/2021 0606   GFRNONAA 36 (L) 12/30/2019 1143   GFRAA 42 (L) 12/30/2019 1143   CBC    Component Value Date/Time   WBC 16.7 (H) 01/22/2021 0655   RBC 3.50 (L) 01/22/2021 0655   HGB 10.1 (L) 01/22/2021 0655   HCT 34.5 (L) 01/22/2021 0655   PLT 175 01/22/2021 0655   MCV 98.6 01/22/2021 0655   MCH 28.9 01/22/2021 0655   MCHC 29.3 (L) 01/22/2021 0655   RDW 15.4 01/22/2021 0655   LYMPHSABS 0.8 12/23/2020 1153   MONOABS 0.6 12/23/2020 1153   EOSABS 0.1 12/23/2020 1153   BASOSABS 0.0 12/23/2020 1153    Assessment/Plan:   AKI/CKD stage IIIb, non-oliguric - presumably ischemic ATN in setting of volume depletion, hypotension, and covid-19 infection.  Required HD on 12/29/20 -01/08/21 due to azotemia, but did not tolerate last HD well.   Since last HD there has been no meaningful recovery, suspect ESRD-  BUN over 100 and FTT.  S/p [facility] 6/20 followed by HD-  then will be ongoing -  plan for next HD treatment today.  Working on [employer] for OP HD.  No permanent HD access yet due to tenuous condition and isolation due to COVID/shingles  Acute metabolic encephalopathy - fluctuates day to day.  HTN - controlled-  cardizem and lopressor for A fib Anemia of acute illness - stable to improved Hgb.  On ESA. Will check hgb in AM since having bleeding from [facility] Shingles outbreak - on left leg. Per primary  Hypocalcemia - improved with supplementation. iPTH 256, on calcitriol /tums and cholecalciferol-  phos is OK H/o covid-19 infection - no longer on isolation protocol for covid but is for shingles outbreak. Disposition - awaiting SNF placement when  medically stable. CLIP for OP HD    Louis Meckel, MD 01/24/2021 8:03 AM

## 2021-01-25 DIAGNOSIS — I2510 Atherosclerotic heart disease of native coronary artery without angina pectoris: Secondary | ICD-10-CM | POA: Diagnosis not present

## 2021-01-25 DIAGNOSIS — B029 Zoster without complications: Secondary | ICD-10-CM | POA: Diagnosis not present

## 2021-01-25 DIAGNOSIS — R279 Unspecified lack of coordination: Secondary | ICD-10-CM | POA: Diagnosis not present

## 2021-01-25 DIAGNOSIS — I959 Hypotension, unspecified: Secondary | ICD-10-CM | POA: Diagnosis not present

## 2021-01-25 DIAGNOSIS — Z8616 Personal history of COVID-19: Secondary | ICD-10-CM | POA: Diagnosis not present

## 2021-01-25 DIAGNOSIS — I10 Essential (primary) hypertension: Secondary | ICD-10-CM | POA: Diagnosis not present

## 2021-01-25 DIAGNOSIS — Z992 Dependence on renal dialysis: Secondary | ICD-10-CM

## 2021-01-25 DIAGNOSIS — N178 Other acute kidney failure: Secondary | ICD-10-CM | POA: Diagnosis not present

## 2021-01-25 DIAGNOSIS — D689 Coagulation defect, unspecified: Secondary | ICD-10-CM | POA: Diagnosis not present

## 2021-01-25 DIAGNOSIS — I509 Heart failure, unspecified: Secondary | ICD-10-CM | POA: Diagnosis not present

## 2021-01-25 DIAGNOSIS — D649 Anemia, unspecified: Secondary | ICD-10-CM | POA: Diagnosis not present

## 2021-01-25 DIAGNOSIS — E1169 Type 2 diabetes mellitus with other specified complication: Secondary | ICD-10-CM | POA: Diagnosis not present

## 2021-01-25 DIAGNOSIS — N170 Acute kidney failure with tubular necrosis: Secondary | ICD-10-CM | POA: Diagnosis not present

## 2021-01-25 DIAGNOSIS — E785 Hyperlipidemia, unspecified: Secondary | ICD-10-CM | POA: Diagnosis not present

## 2021-01-25 DIAGNOSIS — N179 Acute kidney failure, unspecified: Secondary | ICD-10-CM | POA: Diagnosis not present

## 2021-01-25 DIAGNOSIS — N186 End stage renal disease: Secondary | ICD-10-CM | POA: Diagnosis not present

## 2021-01-25 DIAGNOSIS — R531 Weakness: Secondary | ICD-10-CM | POA: Diagnosis not present

## 2021-01-25 DIAGNOSIS — R2689 Other abnormalities of gait and mobility: Secondary | ICD-10-CM | POA: Diagnosis not present

## 2021-01-25 DIAGNOSIS — I130 Hypertensive heart and chronic kidney disease with heart failure and stage 1 through stage 4 chronic kidney disease, or unspecified chronic kidney disease: Secondary | ICD-10-CM | POA: Diagnosis not present

## 2021-01-25 DIAGNOSIS — Z23 Encounter for immunization: Secondary | ICD-10-CM | POA: Diagnosis not present

## 2021-01-25 DIAGNOSIS — N1832 Chronic kidney disease, stage 3b: Secondary | ICD-10-CM | POA: Diagnosis not present

## 2021-01-25 DIAGNOSIS — Z7401 Bed confinement status: Secondary | ICD-10-CM | POA: Diagnosis not present

## 2021-01-25 DIAGNOSIS — L89152 Pressure ulcer of sacral region, stage 2: Secondary | ICD-10-CM | POA: Diagnosis not present

## 2021-01-25 DIAGNOSIS — D631 Anemia in chronic kidney disease: Secondary | ICD-10-CM | POA: Diagnosis not present

## 2021-01-25 DIAGNOSIS — E876 Hypokalemia: Secondary | ICD-10-CM | POA: Diagnosis not present

## 2021-01-25 DIAGNOSIS — G9341 Metabolic encephalopathy: Secondary | ICD-10-CM | POA: Diagnosis not present

## 2021-01-25 DIAGNOSIS — Z794 Long term (current) use of insulin: Secondary | ICD-10-CM | POA: Diagnosis not present

## 2021-01-25 DIAGNOSIS — U071 COVID-19: Secondary | ICD-10-CM | POA: Diagnosis not present

## 2021-01-25 DIAGNOSIS — N2581 Secondary hyperparathyroidism of renal origin: Secondary | ICD-10-CM | POA: Diagnosis not present

## 2021-01-25 DIAGNOSIS — E1129 Type 2 diabetes mellitus with other diabetic kidney complication: Secondary | ICD-10-CM | POA: Diagnosis not present

## 2021-01-25 DIAGNOSIS — I471 Supraventricular tachycardia: Secondary | ICD-10-CM | POA: Diagnosis not present

## 2021-01-25 DIAGNOSIS — M6281 Muscle weakness (generalized): Secondary | ICD-10-CM | POA: Diagnosis not present

## 2021-01-25 DIAGNOSIS — N390 Urinary tract infection, site not specified: Secondary | ICD-10-CM | POA: Diagnosis not present

## 2021-01-25 DIAGNOSIS — D509 Iron deficiency anemia, unspecified: Secondary | ICD-10-CM | POA: Diagnosis not present

## 2021-01-25 DIAGNOSIS — E872 Acidosis: Secondary | ICD-10-CM | POA: Diagnosis not present

## 2021-01-25 LAB — RENAL FUNCTION PANEL
Albumin: 2 g/dL — ABNORMAL LOW (ref 3.5–5.0)
Anion gap: 8 (ref 5–15)
BUN: 29 mg/dL — ABNORMAL HIGH (ref 8–23)
CO2: 29 mmol/L (ref 22–32)
Calcium: 7.2 mg/dL — ABNORMAL LOW (ref 8.9–10.3)
Chloride: 99 mmol/L (ref 98–111)
Creatinine, Ser: 2.07 mg/dL — ABNORMAL HIGH (ref 0.61–1.24)
GFR, Estimated: 32 mL/min — ABNORMAL LOW (ref >60)
Glucose, Bld: 229 mg/dL — ABNORMAL HIGH (ref 70–99)
Phosphorus: 1.9 mg/dL — ABNORMAL LOW (ref 2.5–4.6)
Potassium: 3.6 mmol/L (ref 3.5–5.1)
Sodium: 136 mmol/L (ref 135–145)

## 2021-01-25 LAB — CBC
HCT: 30.7 % — ABNORMAL LOW (ref 39.0–52.0)
Hemoglobin: 9.2 g/dL — ABNORMAL LOW (ref 13.0–17.0)
MCH: 29 pg (ref 26.0–34.0)
MCHC: 30 g/dL (ref 30.0–36.0)
MCV: 96.8 fL (ref 80.0–100.0)
Platelets: 185 10*3/uL (ref 150–400)
RBC: 3.17 MIL/uL — ABNORMAL LOW (ref 4.22–5.81)
RDW: 15.1 % (ref 11.5–15.5)
WBC: 10.4 10*3/uL (ref 4.0–10.5)
nRBC: 0 % (ref 0.0–0.2)

## 2021-01-25 LAB — HEPATITIS B SURFACE ANTIGEN: Hepatitis B Surface Ag: NONREACTIVE

## 2021-01-25 LAB — GLUCOSE, CAPILLARY
Glucose-Capillary: 204 mg/dL — ABNORMAL HIGH (ref 70–99)
Glucose-Capillary: 330 mg/dL — ABNORMAL HIGH (ref 70–99)
Glucose-Capillary: 205 mg/dL — ABNORMAL HIGH (ref 70–99)

## 2021-01-25 MED ORDER — DARBEPOETIN ALFA 150 MCG/0.3ML IJ SOSY: 150.0000 ug | PREFILLED_SYRINGE | INTRAMUSCULAR | Status: DC

## 2021-01-25 MED ORDER — INSULIN GLARGINE 100 UNIT/ML ~~LOC~~ SOLN: 8.0000 [IU] | Freq: Every day | SUBCUTANEOUS | Status: DC

## 2021-01-25 MED ORDER — SACCHAROMYCES BOULARDII 250 MG PO CAPS: 250.0000 mg | ORAL_CAPSULE | Freq: Two times a day (BID) | ORAL | Status: AC

## 2021-01-25 MED ORDER — METOPROLOL TARTRATE 50 MG PO TABS: 50.0000 mg | ORAL_TABLET | Freq: Two times a day (BID) | ORAL | Status: DC

## 2021-01-25 MED ORDER — DILTIAZEM HCL ER COATED BEADS 180 MG PO CP24: 180.0000 mg | ORAL_CAPSULE | Freq: Every day | ORAL | Status: DC

## 2021-01-25 MED ORDER — CHLORHEXIDINE GLUCONATE CLOTH 2 % EX PADS: 6.0000 | MEDICATED_PAD | Freq: Every day | CUTANEOUS | Status: DC

## 2021-01-25 MED ORDER — CALCITRIOL 0.25 MCG PO CAPS: 0.2500 ug | ORAL_CAPSULE | Freq: Every day | ORAL | Status: DC

## 2021-01-25 MED ORDER — HYDROXYZINE HCL 10 MG PO TABS: 10.0000 mg | ORAL_TABLET | Freq: Two times a day (BID) | ORAL | 0 refills | Status: DC | PRN

## 2021-01-25 MED ORDER — CALCIUM CARBONATE ANTACID 500 MG PO CHEW: 1.0000 | CHEWABLE_TABLET | Freq: Every day | ORAL | Status: DC

## 2021-01-25 NOTE — Progress Notes (Signed)
Nephrology Progress Note:   Patient ID: Mark Hose., male   DOB: 05-04-1941, 80 y.o.   MRN: [id number]   S:  he had 450 mL uop over 6/22.  Last HD on 6/22 with 2kg UF.  Feels ok today.  Review of systems: denies shortness of breath or chest pain  Denies n/v   O:BP 121/63 (BP Location: Left Arm)   Pulse 89   Temp 99 F (37.2 C) (Oral)   Resp 18   Ht 5' 8.5" (1.74 m)   Wt 87.5 kg   SpO2 93%   BMI 28.90 kg/m   Intake/Output Summary (Last 24 hours) at 01/25/2021 0912 Last data filed at 01/25/2021 0600 Gross per 24 hour  Intake 630 ml  Output 2451 ml  Net -1821 ml    Intake/Output: I/O last 3 completed shifts: In: 676 [P.O.:870] Out: 1950 [Urine:450; Other:2000; Stool:1]  Intake/Output this shift:  No intake/output data recorded. Weight change:   Gen elderly male in NAD  HENT NCAT CVS:S1S2 no rub Resp: clear and unlabored on room air  Abd: soft, NT/ND Ext: no pitting edema  Psych no anxiety or agitation  Neuro: answers questions (oriented x 3) today but very hard of hearing GU no foley Access right Tunneled catheter in place  Recent Labs  Lab 01/19/21 0559 01/20/21 0637 01/21/21 0656 01/22/21 0655 01/23/21 0627 01/24/21 0606 01/25/21 0442  NA 140 140 141 140 137 140 136  K 3.5 3.7 3.7 4.9 3.5 3.5 3.6  CL 106 107 107 106 100 103 99  CO2 22 22 23  21* 29 28 29   GLUCOSE 148* 155* 169* 168* 157* 127* 229*  BUN 110* 113* 122* 104* 49* 65* 29*  CREATININE 4.94* 4.71* 4.53* 4.27* 2.75* 3.61* 2.07*  ALBUMIN 2.1* 2.0* 2.0* 2.1* 1.9* 2.0* 2.0*  CALCIUM 7.2* 7.4* 7.5* 7.6* 7.3* 7.3* 7.2*  PHOS 4.2 3.5 3.5 3.5 2.8 3.5 1.9*   Liver Function Tests: Recent Labs  Lab 01/23/21 0627 01/24/21 0606 01/25/21 0442  ALBUMIN 1.9* 2.0* 2.0*   No results for input(s): LIPASE, AMYLASE in the last 168 hours. No results for input(s): AMMONIA in the last 168 hours. CBC: Recent Labs  Lab 01/20/21 0637 01/21/21 0656 01/22/21 0655 01/25/21 0442  WBC 11.8* 12.2*  16.7* 10.4  HGB 8.9* 9.5* 10.1* 9.2*  HCT 29.4* 31.5* 34.5* 30.7*  MCV 96.7 96.6 98.6 96.8  PLT 220 205 175 185   Cardiac Enzymes: No results for input(s): CKTOTAL, CKMB, CKMBINDEX, TROPONINI in the last 168 hours. CBG: Recent Labs  Lab 01/24/21 0303 01/24/21 0737 01/24/21 1156 01/24/21 1622 01/25/21 0759  GLUCAP 115* 114* 263* 269* 204*    Iron Studies: No results for input(s): IRON, TIBC, TRANSFERRIN, FERRITIN in the last 72 hours. Studies/Results: No results found.  alteplase  2 mg Intracatheter Once   alteplase  2 mg Intracatheter Once   aspirin EC  81 mg Oral Daily   calcitRIOL  0.25 mcg Oral Daily   calcium carbonate  800 mg of elemental calcium Oral TID WC   Chlorhexidine Gluconate Cloth  6 each Topical Q0600   Chlorhexidine Gluconate Cloth  6 each Topical Q0600   cholecalciferol  2,000 Units Oral Daily   darbepoetin (ARANESP) injection - NON-DIALYSIS  100 mcg Subcutaneous Q Tue-1800   diltiazem  180 mg Oral Daily   insulin aspart  0-9 Units Subcutaneous TID WC   insulin aspart  5 Units Subcutaneous TID WC   insulin glargine  8 Units Subcutaneous QHS  lidocaine  1 patch Transdermal Daily   lipase/protease/amylase  12,000 Units Oral TID AC   mouth rinse  15 mL Mouth Rinse BID   metoprolol tartrate  50 mg Oral BID   pravastatin  10 mg Oral q1800   saccharomyces boulardii  250 mg Oral BID   sodium bicarbonate  1,300 mg Oral BID    BMET    Component Value Date/Time   NA 136 01/25/2021 0442   K 3.6 01/25/2021 0442   CL 99 01/25/2021 0442   CO2 29 01/25/2021 0442   GLUCOSE 229 (H) 01/25/2021 0442   BUN 29 (H) 01/25/2021 0442   CREATININE 2.07 (H) 01/25/2021 0442   CREATININE 1.77 (H) 12/30/2019 1143   CALCIUM 7.2 (L) 01/25/2021 0442   GFRNONAA 32 (L) 01/25/2021 0442   GFRNONAA 36 (L) 12/30/2019 1143   GFRAA 42 (L) 12/30/2019 1143   CBC    Component Value Date/Time   WBC 10.4 01/25/2021 0442   RBC 3.17 (L) 01/25/2021 0442   HGB 9.2 (L) 01/25/2021 0442    HCT 30.7 (L) 01/25/2021 0442   PLT 185 01/25/2021 0442   MCV 96.8 01/25/2021 0442   MCH 29.0 01/25/2021 0442   MCHC 30.0 01/25/2021 0442   RDW 15.1 01/25/2021 0442   LYMPHSABS 0.8 12/23/2020 1153   MONOABS 0.6 12/23/2020 1153   EOSABS 0.1 12/23/2020 1153   BASOSABS 0.0 12/23/2020 1153    Assessment/Plan:   AKI/CKD stage IIIb, non-oliguric - presumably ischemic ATN in setting of volume depletion, hypotension, and covid-19 infection.  Required HD on 12/29/20 -01/08/21 due to azotemia.  Failure to thrive and no meaningful recovery with BUN over 100 and FTT.  Suspect may be ESRD but still monitoring for recovery.  S/p [facility] 6/20 followed by re-initiation of HD.   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  No permanent HD access yet due to tenuous condition and isolation due to COVID/shingles   HD per MWF schedule for now  Acute metabolic encephalopathy - overall appears improved HTN - controlled-  cardizem and lopressor for A fib Anemia of acute illness and CKD. - stable to improved Hgb. Increase aranesp from 100 mcg weekly on Tues to 150 mcg for next dose 6/28.  Shingles outbreak - on left leg. Per primary  Hypocalcemia and metabolic bone disease. iPTH 256, on calcitriol /tums and cholecalciferol-  phos is low. Stop tums as a binder and give nightly. H/o covid-19 infection - no longer on isolation protocol for covid  Disposition - awaiting SNF placement when medically stable. awaiting outpatient HD unit.    Claudia Desanctis, MD 01/25/2021 9:42 AM

## 2021-01-25 NOTE — Progress Notes (Signed)
Called report to [facility] in [place].  Discharge instruction given to patient and wife. Pt and wife verbalized understanding.

## 2021-01-25 NOTE — Progress Notes (Signed)
Inpatient Diabetes Program Recommendations  AACE/ADA: New Consensus Statement on Inpatient Glycemic Control   Target Ranges:  Prepandial:   less than 140 mg/dL      Peak postprandial:   less than 180 mg/dL (1-2 hours)      Critically ill patients:  140 - 180 mg/dL   Results for [name], [name]. (MRN [id number]) as of 01/25/2021 09:05  Ref. Range 01/24/2021 07:37 01/24/2021 11:56 01/24/2021 16:22 01/25/2021 07:59  Glucose-Capillary Latest Ref Range: 70 - 99 mg/dL 114 (H) 263 (H) 269 (H) 204 (H)    Review of Glycemic Control  Diabetes history: DM2 Outpatient Diabetes medications: Novolin R 8 units TID with meals Current orders for Inpatient glycemic control: Lantus 8 units QHS, Novolog 5 units TID with meals, Novolog 0-9 units TID with meals  Inpatient Diabetes Program Recommendations:    Insulin: Please consider increasing meal coverage to Novolog 8 units TID with meals.  Thanks, Barnie Alderman, RN, MSN, CDE Diabetes Coordinator Inpatient Diabetes Program [phone] (Team Pager from 8am to 5pm)

## 2021-01-25 NOTE — Discharge Summary (Signed)
Physician Discharge Summary  Mark CHEATUM Sr. WGY:[id number] DOB: [birth year] DOA: 12/23/2020  PCP: [name], MD  Admit date: 12/23/2020 Discharge date: 01/25/2021  Time spent: 35 minutes  Recommendations for Outpatient Follow-up:  Reassess blood pressure and further adjust antihypertensive regimen Repeat basic metabolic panel to evaluate lites and renal function. Repeat complete blood count to follow hemoglobin and platelets stability.  Discharge Diagnoses:  Active Problems:   AKI (acute kidney injury) ([facility])   Acute renal failure superimposed on stage 3b chronic kidney disease (HCC)   Acute metabolic encephalopathy   COVID-19 virus infection   Metabolic acidosis   SVT (supraventricular tachycardia) (HCC)   Pressure injury of skin   Shingles   Vascular dialysis catheter in place [facility])   Discharge Condition: Stable and improved.  Discharge to skilled nursing facility for further care and rehabilitation  CODE STATUS: Full code.  Diet recommendation: Modified carbohydrate diet and renal diet.  Filed Weights   01/21/21 0506 01/22/21 0500 01/22/21 1740  Weight: 91.4 kg 87.2 kg 87.5 kg    History of present illness:  As per H&P written by Dr. Manuella Ghazi on 12/23/2020 Mark Jumbo Sr. is a 80 y.o. male with medical history significant for type 2 diabetes, CKD stage IIIb, hypertension, dyslipidemia, diastolic CHF, and CAD described to the ED via EMS with increasing weakness and confusion along with poor appetite for the last 2 to 3 days.  Symptoms have been present over the past week and patient was diagnosed with COVID-19 infection on May 7.  He saw his PCP on 5/19 for confusion and was not noted to be hypoxemic at that time.  He was told to hold his Lasix and encouraged to drink plenty of fluids, but he really has not been able to drink very much fluids according to the wife at the bedside.  He was started on some mild delirium related to his COVID infection.  Unfortunately,  his symptoms have progressed significantly to days.  Patient cannot give any further history at this time given his severe confusion.  Primary historian his wife at bedside.   ED Course: Vital signs with some initial hypotension that has responded to fluid bolus.  Additionally he was noted to be tachypneic on arrival to the ED, but tachypnea appears to be improving.  He is otherwise afebrile.  CT head with no acute findings noted.  He is noted to have significantly elevated creatinine of 6.87 for his baseline is usually between 1.7-2.  BUN is 124 and calcium is 5.8.  He has received LR fluid bolus in the ED as well as calcium supplementation and bicarbonate.  ABG shows pH of 7.108 with PCO2 of 19.9.  He is not currently hypoxemic.  He is noted to be COVID-positive.  Blood glucose was 321 and he was started on insulin drip.  Nephrology was contacted by EDP with recommendations for standard work-up of AKI as well as sodium bicarbonate infusion.  No need for urgent hemodialysis as potassium levels are within normal limits.  Hospital Course:  AKI on CKD stage IIIb-voiding well, nonoliguric - -Suspect patient had ischemic ATN due to hypotension and dehydration/volume depletion -Started on HD on 12/29/2020 due to azotemia, last HD session was 01/24/2021 -Overall mentation and overall Renal function improved with hemodialysis. -As per nephrologist recommendations restarted on hemodialysis on 01/22/21 given ongoing episodes of confusion and rising BUN.   -Patient had dialysis last on 01/24/2021, will tolerated and without complications. -Right IJ PermCath placed for HD on  01/22/2021; not longer oozing or bleeding appreciated. -Dialysis treatment anticipated as an outpatient on 01/26/2021. -Patient will be transferred to skilled nursing facility for further care, rehabilitation and conditioning.   Acute shingles outbreak - involving the left foot and leg in a dermatomal pattern. -Patient received treatment with  Valtrex for 7 days -Currently all lesions have crusted and healed. -No longer requiring airborne isolation/precaution.   Acute metabolic encephalopathy-much improved and stable. -intermittently/Occasional episodes of confusion and hallucinations; especially in between dialysis treatments. -Continue constant reorientation and supportive care.   -patient is very hard of hearing and without hearing aids in place might suggest confusional state as unable to answer question correctly.   Hypocalcemia/hypokalemia/hypomagnesemia -replaced and overall stable. -Continue calcitriol and cholecalciferol.     -Status post calcium gluconate on 01/05/2021. -Continue to follow electrolytes trend follow further recommendations by nephrology service.   Anemia in CKD- he was transfused 2 units PRBC on 01/07/2021. -ESA/Epogen per nephrology team -Follow hemoglobin trend and further transfuse as clinically indicated.    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 -Continue to follow CBGs for further adjustment into hypoglycemic regimen.  -At time of presentation patient experienced DKA; which is now resolved and no longer present.      Recent COVID-19 infection- patient status post 3 days of remdesivir for asymptomatic infection.  He has been stable on room air.  He is now over  30 days post diagnosis and no longer in quarantine.   Thrombocytopenia-resolved. -Continue to check platelet count intermittently.   SVT/A. fib with RVR- Cardizem and Lopressor as ordered by cardiology service.   -Patient Cardizem dose increased to 180 mg per cardiology recs.  -will continue metoprolol 50 mg BID.  -Continue outpatient follow-up with cardiology service.   Coronary artery disease -no chest pain symptoms at this time.   -Continue aspirin, pravastatin and metoprolol -Continue heart healthy diet. -Continue outpatient  follow-up with cardiology service.   Resting tremor - Possibly related to severe low Mg levels, seems to be improving; continue to follow closely.    -Consider outpatient neurology follow-up if persistent despite hemodialysis treatment and electrolytes stabilization.   Essential hypertension -stable on current medications. -Continue heart healthy/renal diet.   Aortic root dilatation -Continue outpatient follow-up with cardiology service.  -stable per records.    Leukocytosis  -Appears to be reactive -No fevers no evidence of new infection per se -Repeat complete blood count and if WBC's still elevated, may need outpatient hematology follow-up.  Procedures: Right IJ PermCath placed for HD on 01/22/2021  Consultations: Nephrology service Cardiology service General surgery  Discharge Exam: Vitals:   01/25/21 0534 01/25/21 0931  BP: 121/63 119/60  Pulse: 89 92  Resp: 18   Temp: 99 F (37.2 C)   SpO2: 93%     General: Alert, awake and oriented x3; following commands appropriately.  No shortness of breath, no nausea, no vomiting, no fever, patient is hemodynamically stable. Cardiovascular: RRR.  No rubs, no gallops, no JVD on exam. Respiratory: Clear to auscultation bilaterally; no using accessory muscle.  Good saturation on room air. Abdomen: Soft, nontender, nondistended, positive bowel sounds Extremities: No cyanosis or clubbing. Skin: herpes zoster rash demonstrating completely crusted lesions and no signs of superimposed infection.  Right IJ tunnel hemodialysis catheter in place.  No signs of active oozing or bleeding.  Stage II sacral pressure injury appreciated on exam.  No signs of infection.  Discharge Instructions  Discharge Instructions     Diet - low sodium heart healthy   Complete by: As directed    Discharge instructions   Complete by: As directed    Patient to be started on hemodialysis as an outpatient on 01/26/2021 Follow modified carbohydrate and renal  diet Maintain adequate hydration Outpatient follow-up with Nephrology Physical therapy, rehabilitation and conditioning as per the skilled nursing facility protocol.   Discharge wound care:   Complete by: As directed    Keep area clean and dry; constant repositioning and use of barrier strategies.  No signs of superimposed infection appreciated.   Increase activity slowly   Complete by: As directed       Allergies as of 01/25/2021       Reactions   Enalapril Maleate Cough        Medication List     STOP taking these medications    carvedilol 25 MG tablet Commonly known as: COREG   diazepam 5 MG tablet Commonly known as: VALIUM   doxazosin 8 MG tablet Commonly known as: CARDURA   furosemide 20 MG tablet Commonly known as: LASIX   ketoconazole 2 % cream Commonly known as: NIZORAL   valACYclovir 1000 MG tablet Commonly known as: VALTREX       TAKE these medications    albuterol 108 (90 Base) MCG/ACT inhaler Commonly known as: VENTOLIN HFA Inhale 2 puffs into the lungs every 4 (four) hours as needed for wheezing or shortness of breath.   aspirin EC 81 MG tablet Take 81 mg by mouth at bedtime.   calcitRIOL 0.25 MCG capsule Commonly known as: ROCALTROL Take 1 capsule (0.25 mcg total) by mouth daily. Start taking on: January 26, 2021   Darbepoetin Alfa 150 MCG/0.3ML Sosy injection Commonly known as: ARANESP Inject 0.3 mLs (150 mcg total) into the skin every Tuesday at 6 PM. Start taking on: January 30, 2021   diltiazem 180 MG 24 hr capsule Commonly known as: CARDIZEM CD Take 1 capsule (180 mg total) by mouth daily. Start taking on: January 26, 2021   ergocalciferol 1.25 MG (50000 UT) capsule Commonly known as: VITAMIN D2 Take 50,000 Units by mouth once a week.   FreeStyle Libre 2 Reader [employer] Use 1 Device as directed   hydrOXYzine 10 MG tablet Commonly known as: ATARAX/VISTARIL Take 1 tablet (10 mg total) by mouth every 12 (twelve) hours as needed for  itching or anxiety.   insulin glargine 100 UNIT/ML injection Commonly known as: LANTUS Inject 0.08 mLs (8 Units total) into the skin at bedtime.   insulin regular 100 units/mL injection Commonly known as: NOVOLIN R Inject 8 Units into the skin 3 (three) times daily before meals. 12u am  10u supper   metoprolol tartrate 50 MG tablet Commonly known as: LOPRESSOR Take 1 tablet (50 mg total) by mouth 2 (two) times daily.   multivitamin with minerals Tabs tablet Take 1 tablet by mouth daily.   pravastatin 10 MG tablet Commonly known as: PRAVACHOL TAKE 1 TABLET(10 MG) BY MOUTH DAILY WITH BREAKFAST What changed: See the new instructions.   saccharomyces boulardii 250 MG capsule Commonly known as: FLORASTOR Take 1 capsule (250 mg total) by mouth 2 (two) times daily for 15 days.   Zenpep 10000-32000 units Cpep Generic drug: Pancrelipase (Lip-Prot-Amyl) TAKE 1 CAPSULE BY MOUTH THREE TIMES DAILY BEFORE MEALS What changed: See the new instructions.               Discharge Care Instructions  (From admission, onward)  Start     Ordered   01/25/21 0000  Discharge wound care:       Comments: Keep area clean and dry; constant repositioning and use of barrier strategies.  No signs of superimposed infection appreciated.   01/25/21 1119           Allergies  Allergen Reactions   Enalapril Maleate Cough    Contact information for follow-up providers     [name], MD. Schedule an appointment as soon as possible for a visit in 2 week(s).   Specialty: Family Medicine Why: After discharge from the skilled nursing facility. Contact information: [place] [address] [phone]         Satira Sark, MD .   Specialty: Cardiology Contact information: [place] [address] [phone]              Contact information for after-discharge care     [facility]  Preferred SNF .   Service: Skilled Nursing Contact information: [address] [place] [phone]                     The results of significant diagnostics from this hospitalization (including imaging, microbiology, ancillary and laboratory) are listed below for reference.    Significant Diagnostic Studies: DG Chest Port 1 View  Result Date: 01/22/2021 CLINICAL DATA:  Dialysis catheter placement EXAM: PORTABLE CHEST 1 VIEW COMPARISON:  12/29/2020 FINDINGS: Interval placement of large-bore right neck multi lumen vascular catheter, tip projecting near the superior cavoatrial junction. Unchanged elevation of the right hemidiaphragm. No acute appearing airspace opacity. The heart and mediastinum are unremarkable. IMPRESSION: 1. Interval placement of large-bore right neck multi lumen vascular catheter, tip projecting near the superior cavoatrial junction. 2. Unchanged elevation of the right hemidiaphragm. No acute appearing airspace opacity. Electronically Signed   By: Eddie Candle M.D.   On: 01/22/2021 16:23   DG Chest Port 1 View  Result Date: 12/29/2020 CLINICAL DATA:  Status post dialysis catheter placement. EXAM: PORTABLE CHEST 1 VIEW COMPARISON:  Single-view of the chest 12/23/2020. FINDINGS: Left IJ approach dialysis catheter is in place with the tip projecting in the lower superior vena cava. No pneumothorax. Elevation of the right hemidiaphragm is unchanged. Lungs are clear. Heart size is upper normal. No acute or focal bony abnormality. IMPRESSION: Dialysis catheter tip projects in the lower superior vena cava. Negative for pneumothorax or acute disease. Electronically Signed   By: Inge Rise M.D.   On: 12/29/2020 11:42   DG C-Arm 1-60 Min-No Report  Result Date: 01/22/2021 Fluoroscopy was utilized by the requesting physician.  No radiographic interpretation.   ECHOCARDIOGRAM COMPLETE  Result Date: 12/31/2020    ECHOCARDIOGRAM REPORT    Patient Name:   Mark WAGEMAN Sr. Date of Exam: 12/31/2020 Medical Rec #:  [id number]            Height:       68.5 in Accession #:    [id number]           Weight:       208.6 lb Date of Birth:  June 09, 1941            BSA:          2.092 m Patient Age:    53 years             BP:  146/63 mmHg Patient Gender: M                    HR:           84 bpm. Exam Location:  [facility] Procedure: 2D Echo, Cardiac Doppler and Color Doppler Indications:    Atrial Fibrillation I48.91  History:        Patient has prior history of Echocardiogram examinations, most                 recent 12/12/2011. Risk Factors:Hypertension, Diabetes and                 Dyslipidemia.  Sonographer:    Alvino Chapel RCS Referring Phys: [place]  1. Left ventricular ejection fraction, by estimation, is 60 to 65%. The left ventricle has normal function. The left ventricle has no regional wall motion abnormalities. There is mild left ventricular hypertrophy. Left ventricular diastolic parameters are consistent with Grade I diastolic dysfunction (impaired relaxation).  2. Right ventricular systolic function is normal. The right ventricular size is normal. There is normal pulmonary artery systolic pressure.  3. The mitral valve is normal in structure. No evidence of mitral valve regurgitation. No evidence of mitral stenosis.  4. The aortic valve is tricuspid. Aortic valve regurgitation is not visualized. Mild aortic valve sclerosis is present, with no evidence of aortic valve stenosis.  5. Aortic dilatation noted. There is mild dilatation of the aortic root, measuring 39 mm.  6. The inferior vena cava is normal in size with greater than 50% respiratory variability, suggesting right atrial pressure of 3 mmHg. FINDINGS  Left Ventricle: Left ventricular ejection fraction, by estimation, is 60 to 65%. The left ventricle has normal function. The left ventricle has no regional wall motion abnormalities. The left ventricular  internal cavity size was normal in size. There is  mild left ventricular hypertrophy. Left ventricular diastolic parameters are consistent with Grade I diastolic dysfunction (impaired relaxation). Right Ventricle: The right ventricular size is normal. Right ventricular systolic function is normal. There is normal pulmonary artery systolic pressure. The tricuspid regurgitant velocity is 2.82 m/s, and with an assumed right atrial pressure of 3 mmHg,  the estimated right ventricular systolic pressure is 84.5 mmHg. Left Atrium: Left atrial size was normal in size. Right Atrium: Right atrial size was normal in size. Pericardium: There is no evidence of pericardial effusion. Mitral Valve: The mitral valve is normal in structure. Mild mitral annular calcification. No evidence of mitral valve regurgitation. No evidence of mitral valve stenosis. Tricuspid Valve: The tricuspid valve is normal in structure. Tricuspid valve regurgitation is mild . No evidence of tricuspid stenosis. Aortic Valve: The aortic valve is tricuspid. Aortic valve regurgitation is not visualized. Mild aortic valve sclerosis is present, with no evidence of aortic valve stenosis. Pulmonic Valve: The pulmonic valve was normal in structure. Pulmonic valve regurgitation is not visualized. No evidence of pulmonic stenosis. Aorta: Aortic dilatation noted. There is mild dilatation of the aortic root, measuring 39 mm. Venous: The inferior vena cava is normal in size with greater than 50% respiratory variability, suggesting right atrial pressure of 3 mmHg.  LEFT VENTRICLE PLAX 2D LVIDd:         4.80 cm  Diastology LVIDs:         3.30 cm  LV e' medial:    5.66 cm/s LV PW:         1.10 cm  LV E/e' medial:  17.0 LV IVS:  1.50 cm  LV e' lateral:   8.81 cm/s LVOT diam:     2.10 cm  LV E/e' lateral: 10.9 LV SV:         85 LV SV Index:   40 LVOT Area:     3.46 cm  RIGHT VENTRICLE RV S prime:     12.00 cm/s TAPSE (M-mode): 2.1 cm LEFT ATRIUM             Index        RIGHT ATRIUM           Index LA diam:        3.00 cm 1.43 cm/m  RA Area:     16.10 cm LA Vol (A2C):   50.1 ml 23.95 ml/m RA Volume:   43.20 ml  20.65 ml/m LA Vol (A4C):   60.2 ml 28.78 ml/m LA Biplane Vol: 56.1 ml 26.82 ml/m  AORTIC VALVE LVOT Vmax:   110.00 cm/s LVOT Vmean:  79.300 cm/s LVOT VTI:    0.244 m  AORTA Ao Root diam: 3.90 cm MITRAL VALVE                TRICUSPID VALVE MV Area (PHT): 4.08 cm     TR Peak grad:   31.8 mmHg MV Decel Time: 186 msec     TR Vmax:        282.00 cm/s MV E velocity: 96.00 cm/s MV A velocity: 117.00 cm/s  SHUNTS MV E/A ratio:  0.82         Systemic VTI:  0.24 m                             Systemic Diam: 2.10 cm Kirk Ruths MD Electronically signed by Kirk Ruths MD Signature Date/Time: 12/31/2020/10:23:47 AM    Final     Microbiology: Recent Results (from the past 240 hour(s))  Surgical pcr screen     Status: None   Collection Time: 01/21/21  9:21 PM   Specimen: Nasal Mucosa; Nasal Swab  Result Value Ref Range Status   MRSA, PCR NEGATIVE NEGATIVE Final   Staphylococcus aureus NEGATIVE NEGATIVE Final    Comment: (NOTE) The Xpert SA Assay (FDA approved for NASAL specimens in patients 70 years of age and older), is one component of a comprehensive surveillance program. It is not intended to diagnose infection nor to guide or monitor treatment. Performed at [facility], [address] [place], [place] [address]      Labs: Basic Metabolic Panel: Recent Labs  Lab 01/21/21 0656 01/22/21 0655 01/23/21 0627 01/24/21 0606 01/25/21 0442  NA 141 140 137 140 136  K 3.7 4.9 3.5 3.5 3.6  CL 107 106 100 103 99  CO2 23 21* 29 28 29   GLUCOSE 169* 168* 157* 127* 229*  BUN 122* 104* 49* 65* 29*  CREATININE 4.53* 4.27* 2.75* 3.61* 2.07*  CALCIUM 7.5* 7.6* 7.3* 7.3* 7.2*  PHOS 3.5 3.5 2.8 3.5 1.9*   Liver Function Tests: Recent Labs  Lab 01/21/21 0656 01/22/21 0655 01/23/21 0627 01/24/21 0606 01/25/21 0442  ALBUMIN 2.0* 2.1* 1.9* 2.0* 2.0*     CBC: Recent Labs  Lab 01/20/21 0637 01/21/21 0656 01/22/21 0655 01/25/21 0442  WBC 11.8* 12.2* 16.7* 10.4  HGB 8.9* 9.5* 10.1* 9.2*  HCT 29.4* 31.5* 34.5* 30.7*  MCV 96.7 96.6 98.6 96.8  PLT 220 205 175 185    CBG: Recent Labs  Lab 01/24/21 0303 01/24/21 0737 01/24/21  1156 01/24/21 1622 01/25/21 0759  GLUCAP 115* 114* 263* 269* 204*    Signed:  Barton Dubois MD.  [facility] 01/25/2021, 11:27 AM

## 2021-01-25 NOTE — TOC Progression Note (Signed)
Transition of Care (TOC) - Progression Note    Patient Details  Name: Mark BUHL Sr. MRN: [id number] Date of Birth: 04-07-1941  Transition of Care [facility]) CM/SW Contact  Ihor Gully, LCSW Phone Number: 01/25/2021, 8:52 AM  Clinical Narrative:    Mickel Baas at [facility] advised that they are waiting for the Medical Director to give clearance before a chair time is assigned. Mickel Baas to return contact once clearance is received.    Expected Discharge Plan: Skilled Nursing Facility Barriers to Discharge: Continued Medical Work up  Expected Discharge Plan and Services Expected Discharge Plan: [place] Choice: [place] arrangements for the past 2 months: Single Family Home                                       Social Determinants of Health (SDOH) Interventions    Readmission Risk Interventions Readmission Risk Prevention Plan 12/04/2018  Transportation Screening Complete  PCP or Specialist Appt within 3-5 Days (No Data)  [place] or [place] Complete  Social Work Consult for [place] Planning/Counseling Complete  Palliative Care Screening Not Applicable  Medication Review [employer] photographer) Complete  Some recent data might be hidden

## 2021-01-25 NOTE — Progress Notes (Addendum)
Physical Therapy Treatment Patient Details Name: Mark ULIN Sr. MRN: [id number] DOB: 1941/06/17 Today's Date: 01/25/2021    History of Present Illness Mark L Lasch Sr. is a 80 y.o. male with medical history significant for type 2 diabetes, CKD stage IIIb, hypertension, dyslipidemia, diastolic CHF, and CAD described to the ED via EMS with increasing weakness and confusion along with poor appetite for the last 2 to 3 days.  Symptoms have been present over the past week and patient was diagnosed with COVID-19 infection on May 7.  He saw his PCP on 5/19 for confusion and was not noted to be hypoxemic at that time.  He was told to hold his Lasix and encouraged to drink plenty of fluids, but he really has not been able to drink very much fluids according to the wife at the bedside.  He was started on some mild delirium related to his COVID infection.  Unfortunately, his symptoms have progressed significantly to days.  Patient cannot give any further history at this time given his severe confusion.  Primary historian his wife at bedside.    PT Comments    Patient presents in bed and agreeable to therapy. Patient demonstrated a noted improvement in bed mobility increasing to min assist for side lying to seated at EOB. Patients therapeutic exercise program included;  heel raises, toe raises, long arc quads, seated marching, and sit to stands. Patient demonstrated improved ambulation tolerates by walking using a RW for 40 feet around the room without loss of balance, limited by fatigue. Patient will benefit from continued physical therapy in hospital and recommended venue below to increase strength, balance, endurance for safe ADLs and gait.   Follow Up Recommendations  SNF;Supervision - Intermittent;Supervision for mobility/OOB     Equipment Recommendations  None recommended by PT    Recommendations for Other Services       Precautions / Restrictions Precautions Precautions:  Fall Restrictions Weight Bearing Restrictions: No    Mobility  Bed Mobility Overal bed mobility: Modified Independent Bed Mobility: Rolling;Sidelying to Sit Rolling: Supervision;Min guard Sidelying to sit: Min assist;Min guard       General bed mobility comments: increased time, labored movement Patient Response: Cooperative  Transfers Overall transfer level: Needs assistance Equipment used: Rolling walker (2 wheeled) Transfers: Sit to/from Stand Sit to Stand: Min assist;Mod assist         General transfer comment: slow labored movement, required verbal cues for hand and foot placement  Ambulation/Gait Ambulation/Gait assistance: Min guard;Min assist Gait Distance (Feet): 40 Feet Assistive device: Rolling walker (2 wheeled) Gait Pattern/deviations: Decreased step length - right;Decreased step length - left;Decreased stride length;Narrow base of support Gait velocity: decreased   General Gait Details: slow labored movement, increased time, use of RW without loss of balance   Stairs             Wheelchair Mobility    Modified Rankin (Stroke Patients Only)       Balance Overall balance assessment: Needs assistance Sitting-balance support: Feet supported;No upper extremity supported Sitting balance-Leahy Scale: Good Sitting balance - Comments: seated at EOB   Standing balance support: During functional activity;Bilateral upper extremity supported Standing balance-Leahy Scale: Fair Standing balance comment: using RW                            Cognition Arousal/Alertness: Awake/alert Behavior During Therapy: WFL for tasks assessed/performed Overall Cognitive Status: Within Functional Limits for tasks assessed  Exercises General Exercises - Lower Extremity Long Arc Quad: AROM;Strengthening;Seated;Both;20 reps Hip Flexion/Marching: AROM;Seated;Strengthening;Both;10 reps Toe Raises:  AROM;Seated;Strengthening;Both;15 reps Heel Raises: AROM;Seated;Strengthening;Both;15 reps Mini-Sqauts: Standing;AROM;Strengthening;Both;20 reps    General Comments        Pertinent Vitals/Pain Pain Assessment: No/denies pain    Home Living                      Prior Function            PT Goals (current goals can now be found in the care plan section) Acute Rehab PT Goals Patient Stated Goal: return home after rehab PT Goal Formulation: With patient/family Time For Goal Achievement: 01/30/21 Potential to Achieve Goals: Good Progress towards PT goals: Progressing toward goals    Frequency    Min 3X/week      PT Plan Current plan remains appropriate    Co-evaluation              AM-PAC PT "6 Clicks" Mobility   Outcome Measure  Help needed turning from your back to your side while in a flat bed without using bedrails?: A Little Help needed moving from lying on your back to sitting on the side of a flat bed without using bedrails?: A Little Help needed moving to and from a bed to a chair (including a wheelchair)?: A Little Help needed standing up from a chair using your arms (e.g., wheelchair or bedside chair)?: A Lot Help needed to walk in hospital room?: A Lot Help needed climbing 3-5 steps with a railing? : A Lot 6 Click Score: 15    End of Session   Activity Tolerance: Patient tolerated treatment well;Patient limited by fatigue Patient left: in chair;with call bell/phone within reach;with chair alarm set;with family/visitor present Nurse Communication: Mobility status PT Visit Diagnosis: Unsteadiness on feet (R26.81);Other abnormalities of gait and mobility (R26.89);Muscle weakness (generalized) (M62.81)     Time: 9381-8299 PT Time Calculation (min) (ACUTE ONLY): 18 min  Charges:  $Therapeutic Activity: 8-22 mins                    11:27 AM, 01/25/21 Sinclair Ship SPT  11:27 AM, 01/25/21 Lonell Grandchild, MPT Physical Therapist with  [facility] 336 [phone] office [phone] mobile phone

## 2021-01-26 DIAGNOSIS — Z23 Encounter for immunization: Secondary | ICD-10-CM | POA: Diagnosis not present

## 2021-01-26 DIAGNOSIS — Z992 Dependence on renal dialysis: Secondary | ICD-10-CM | POA: Diagnosis not present

## 2021-01-26 DIAGNOSIS — D689 Coagulation defect, unspecified: Secondary | ICD-10-CM | POA: Diagnosis not present

## 2021-01-26 DIAGNOSIS — N2581 Secondary hyperparathyroidism of renal origin: Secondary | ICD-10-CM | POA: Diagnosis not present

## 2021-01-26 DIAGNOSIS — N178 Other acute kidney failure: Secondary | ICD-10-CM | POA: Diagnosis not present

## 2021-01-27 LAB — GLUCOSE, CAPILLARY
Glucose-Capillary: 317 mg/dL — ABNORMAL HIGH (ref 70–99)
Glucose-Capillary: 264 mg/dL — ABNORMAL HIGH (ref 70–99)

## 2021-01-29 DIAGNOSIS — E1169 Type 2 diabetes mellitus with other specified complication: Secondary | ICD-10-CM | POA: Diagnosis not present

## 2021-01-29 DIAGNOSIS — I509 Heart failure, unspecified: Secondary | ICD-10-CM | POA: Diagnosis not present

## 2021-01-29 DIAGNOSIS — I2510 Atherosclerotic heart disease of native coronary artery without angina pectoris: Secondary | ICD-10-CM | POA: Diagnosis not present

## 2021-01-29 DIAGNOSIS — N2581 Secondary hyperparathyroidism of renal origin: Secondary | ICD-10-CM | POA: Diagnosis not present

## 2021-01-29 DIAGNOSIS — E785 Hyperlipidemia, unspecified: Secondary | ICD-10-CM | POA: Diagnosis not present

## 2021-01-29 DIAGNOSIS — N178 Other acute kidney failure: Secondary | ICD-10-CM | POA: Diagnosis not present

## 2021-01-29 DIAGNOSIS — Z794 Long term (current) use of insulin: Secondary | ICD-10-CM | POA: Diagnosis not present

## 2021-01-29 DIAGNOSIS — I10 Essential (primary) hypertension: Secondary | ICD-10-CM | POA: Diagnosis not present

## 2021-01-29 DIAGNOSIS — D509 Iron deficiency anemia, unspecified: Secondary | ICD-10-CM | POA: Diagnosis not present

## 2021-01-29 DIAGNOSIS — D649 Anemia, unspecified: Secondary | ICD-10-CM | POA: Diagnosis not present

## 2021-01-29 DIAGNOSIS — Z992 Dependence on renal dialysis: Secondary | ICD-10-CM | POA: Diagnosis not present

## 2021-01-29 DIAGNOSIS — D689 Coagulation defect, unspecified: Secondary | ICD-10-CM | POA: Diagnosis not present

## 2021-01-31 DIAGNOSIS — G9341 Metabolic encephalopathy: Secondary | ICD-10-CM | POA: Diagnosis not present

## 2021-02-01 DIAGNOSIS — N179 Acute kidney failure, unspecified: Secondary | ICD-10-CM | POA: Diagnosis not present

## 2021-02-01 DIAGNOSIS — N390 Urinary tract infection, site not specified: Secondary | ICD-10-CM | POA: Diagnosis not present

## 2021-02-02 ENCOUNTER — Encounter (HOSPITAL_COMMUNITY): Payer: Self-pay | Admitting: Emergency Medicine

## 2021-02-02 ENCOUNTER — Emergency Department (HOSPITAL_COMMUNITY): Payer: PPO

## 2021-02-02 ENCOUNTER — Other Ambulatory Visit: Payer: Self-pay

## 2021-02-02 ENCOUNTER — Inpatient Hospital Stay (HOSPITAL_COMMUNITY)
Admission: EM | Admit: 2021-02-02 | Discharge: 2021-02-06 | DRG: 871 | Disposition: A | Discharge status: Skilled Nursing Facility | Payer: PPO | Source: Skilled Nursing Facility | Attending: Internal Medicine | Admitting: Internal Medicine

## 2021-02-02 DIAGNOSIS — N390 Urinary tract infection, site not specified: Secondary | ICD-10-CM | POA: Diagnosis not present

## 2021-02-02 DIAGNOSIS — R404 Transient alteration of awareness: Secondary | ICD-10-CM | POA: Diagnosis not present

## 2021-02-02 DIAGNOSIS — Z8616 Personal history of COVID-19: Secondary | ICD-10-CM

## 2021-02-02 DIAGNOSIS — Z833 Family history of diabetes mellitus: Secondary | ICD-10-CM | POA: Diagnosis not present

## 2021-02-02 DIAGNOSIS — E1122 Type 2 diabetes mellitus with diabetic chronic kidney disease: Secondary | ICD-10-CM | POA: Diagnosis not present

## 2021-02-02 DIAGNOSIS — D689 Coagulation defect, unspecified: Secondary | ICD-10-CM | POA: Diagnosis not present

## 2021-02-02 DIAGNOSIS — Z96643 Presence of artificial hip joint, bilateral: Secondary | ICD-10-CM | POA: Diagnosis present

## 2021-02-02 DIAGNOSIS — Z7982 Long term (current) use of aspirin: Secondary | ICD-10-CM

## 2021-02-02 DIAGNOSIS — E1165 Type 2 diabetes mellitus with hyperglycemia: Secondary | ICD-10-CM | POA: Diagnosis not present

## 2021-02-02 DIAGNOSIS — E11311 Type 2 diabetes mellitus with unspecified diabetic retinopathy with macular edema: Secondary | ICD-10-CM | POA: Diagnosis present

## 2021-02-02 DIAGNOSIS — Z8249 Family history of ischemic heart disease and other diseases of the circulatory system: Secondary | ICD-10-CM

## 2021-02-02 DIAGNOSIS — J9811 Atelectasis: Secondary | ICD-10-CM | POA: Diagnosis not present

## 2021-02-02 DIAGNOSIS — E11649 Type 2 diabetes mellitus with hypoglycemia without coma: Secondary | ICD-10-CM | POA: Diagnosis present

## 2021-02-02 DIAGNOSIS — Z8546 Personal history of malignant neoplasm of prostate: Secondary | ICD-10-CM

## 2021-02-02 DIAGNOSIS — E162 Hypoglycemia, unspecified: Secondary | ICD-10-CM | POA: Diagnosis present

## 2021-02-02 DIAGNOSIS — R0689 Other abnormalities of breathing: Secondary | ICD-10-CM | POA: Diagnosis not present

## 2021-02-02 DIAGNOSIS — I130 Hypertensive heart and chronic kidney disease with heart failure and stage 1 through stage 4 chronic kidney disease, or unspecified chronic kidney disease: Secondary | ICD-10-CM | POA: Diagnosis not present

## 2021-02-02 DIAGNOSIS — I10 Essential (primary) hypertension: Secondary | ICD-10-CM | POA: Diagnosis not present

## 2021-02-02 DIAGNOSIS — G9341 Metabolic encephalopathy: Secondary | ICD-10-CM | POA: Diagnosis present

## 2021-02-02 DIAGNOSIS — N178 Other acute kidney failure: Secondary | ICD-10-CM | POA: Diagnosis not present

## 2021-02-02 DIAGNOSIS — L89322 Pressure ulcer of left buttock, stage 2: Secondary | ICD-10-CM | POA: Diagnosis not present

## 2021-02-02 DIAGNOSIS — A419 Sepsis, unspecified organism: Secondary | ICD-10-CM | POA: Diagnosis not present

## 2021-02-02 DIAGNOSIS — M130 Polyarthritis, unspecified: Secondary | ICD-10-CM | POA: Diagnosis not present

## 2021-02-02 DIAGNOSIS — E782 Mixed hyperlipidemia: Secondary | ICD-10-CM | POA: Diagnosis present

## 2021-02-02 DIAGNOSIS — A4151 Sepsis due to Escherichia coli [E. coli]: Principal | ICD-10-CM | POA: Diagnosis present

## 2021-02-02 DIAGNOSIS — R410 Disorientation, unspecified: Secondary | ICD-10-CM | POA: Diagnosis not present

## 2021-02-02 DIAGNOSIS — K760 Fatty (change of) liver, not elsewhere classified: Secondary | ICD-10-CM | POA: Diagnosis present

## 2021-02-02 DIAGNOSIS — Z992 Dependence on renal dialysis: Secondary | ICD-10-CM

## 2021-02-02 DIAGNOSIS — I5032 Chronic diastolic (congestive) heart failure: Secondary | ICD-10-CM | POA: Diagnosis not present

## 2021-02-02 DIAGNOSIS — N1832 Chronic kidney disease, stage 3b: Secondary | ICD-10-CM | POA: Diagnosis not present

## 2021-02-02 DIAGNOSIS — E1140 Type 2 diabetes mellitus with diabetic neuropathy, unspecified: Secondary | ICD-10-CM | POA: Diagnosis not present

## 2021-02-02 DIAGNOSIS — I509 Heart failure, unspecified: Secondary | ICD-10-CM | POA: Diagnosis not present

## 2021-02-02 DIAGNOSIS — Z79899 Other long term (current) drug therapy: Secondary | ICD-10-CM

## 2021-02-02 DIAGNOSIS — E161 Other hypoglycemia: Secondary | ICD-10-CM | POA: Diagnosis not present

## 2021-02-02 DIAGNOSIS — I2510 Atherosclerotic heart disease of native coronary artery without angina pectoris: Secondary | ICD-10-CM | POA: Diagnosis not present

## 2021-02-02 DIAGNOSIS — R739 Hyperglycemia, unspecified: Secondary | ICD-10-CM | POA: Diagnosis not present

## 2021-02-02 DIAGNOSIS — R531 Weakness: Secondary | ICD-10-CM | POA: Diagnosis not present

## 2021-02-02 DIAGNOSIS — I132 Hypertensive heart and chronic kidney disease with heart failure and with stage 5 chronic kidney disease, or end stage renal disease: Secondary | ICD-10-CM | POA: Diagnosis not present

## 2021-02-02 DIAGNOSIS — K8689 Other specified diseases of pancreas: Secondary | ICD-10-CM | POA: Diagnosis not present

## 2021-02-02 DIAGNOSIS — E1142 Type 2 diabetes mellitus with diabetic polyneuropathy: Secondary | ICD-10-CM | POA: Diagnosis present

## 2021-02-02 DIAGNOSIS — M6281 Muscle weakness (generalized): Secondary | ICD-10-CM | POA: Diagnosis not present

## 2021-02-02 DIAGNOSIS — K7581 Nonalcoholic steatohepatitis (NASH): Secondary | ICD-10-CM | POA: Diagnosis not present

## 2021-02-02 DIAGNOSIS — R262 Difficulty in walking, not elsewhere classified: Secondary | ICD-10-CM | POA: Diagnosis not present

## 2021-02-02 DIAGNOSIS — N2581 Secondary hyperparathyroidism of renal origin: Secondary | ICD-10-CM | POA: Diagnosis present

## 2021-02-02 DIAGNOSIS — E876 Hypokalemia: Secondary | ICD-10-CM | POA: Diagnosis present

## 2021-02-02 DIAGNOSIS — H9191 Unspecified hearing loss, right ear: Secondary | ICD-10-CM | POA: Diagnosis not present

## 2021-02-02 DIAGNOSIS — N186 End stage renal disease: Secondary | ICD-10-CM | POA: Diagnosis not present

## 2021-02-02 DIAGNOSIS — E785 Hyperlipidemia, unspecified: Secondary | ICD-10-CM | POA: Diagnosis not present

## 2021-02-02 DIAGNOSIS — N170 Acute kidney failure with tubular necrosis: Secondary | ICD-10-CM | POA: Diagnosis not present

## 2021-02-02 DIAGNOSIS — D6489 Other specified anemias: Secondary | ICD-10-CM | POA: Diagnosis not present

## 2021-02-02 DIAGNOSIS — Z794 Long term (current) use of insulin: Secondary | ICD-10-CM

## 2021-02-02 DIAGNOSIS — D509 Iron deficiency anemia, unspecified: Secondary | ICD-10-CM | POA: Diagnosis not present

## 2021-02-02 DIAGNOSIS — R4182 Altered mental status, unspecified: Secondary | ICD-10-CM | POA: Diagnosis not present

## 2021-02-02 DIAGNOSIS — N179 Acute kidney failure, unspecified: Secondary | ICD-10-CM | POA: Diagnosis not present

## 2021-02-02 DIAGNOSIS — R0902 Hypoxemia: Secondary | ICD-10-CM | POA: Diagnosis not present

## 2021-02-02 DIAGNOSIS — I5030 Unspecified diastolic (congestive) heart failure: Secondary | ICD-10-CM | POA: Diagnosis not present

## 2021-02-02 DIAGNOSIS — R6520 Severe sepsis without septic shock: Secondary | ICD-10-CM | POA: Diagnosis present

## 2021-02-02 LAB — APTT: aPTT: 36 seconds (ref 24–36)

## 2021-02-02 LAB — CBC WITH DIFFERENTIAL/PLATELET
Abs Immature Granulocytes: 0.09 10*3/uL — ABNORMAL HIGH (ref 0.00–0.07)
Basophils Absolute: 0 10*3/uL (ref 0.0–0.1)
Basophils Relative: 0 %
Eosinophils Absolute: 0 10*3/uL (ref 0.0–0.5)
Eosinophils Relative: 0 %
HCT: 29.6 % — ABNORMAL LOW (ref 39.0–52.0)
Hemoglobin: 9 g/dL — ABNORMAL LOW (ref 13.0–17.0)
Immature Granulocytes: 1 %
Lymphocytes Relative: 2 %
Lymphs Abs: 0.3 10*3/uL — ABNORMAL LOW (ref 0.7–4.0)
MCH: 30.3 pg (ref 26.0–34.0)
MCHC: 30.4 g/dL (ref 30.0–36.0)
MCV: 99.7 fL (ref 80.0–100.0)
Monocytes Absolute: 0.8 10*3/uL (ref 0.1–1.0)
Monocytes Relative: 4 %
Neutro Abs: 16.2 10*3/uL — ABNORMAL HIGH (ref 1.7–7.7)
Neutrophils Relative %: 93 %
Platelets: 204 10*3/uL (ref 150–400)
RBC: 2.97 MIL/uL — ABNORMAL LOW (ref 4.22–5.81)
RDW: 16.5 % — ABNORMAL HIGH (ref 11.5–15.5)
WBC: 17.4 10*3/uL — ABNORMAL HIGH (ref 4.0–10.5)
nRBC: 0 % (ref 0.0–0.2)

## 2021-02-02 LAB — CBG MONITORING, ED
Glucose-Capillary: 143 mg/dL — ABNORMAL HIGH (ref 70–99)
Glucose-Capillary: 48 mg/dL — ABNORMAL LOW (ref 70–99)
Glucose-Capillary: 80 mg/dL (ref 70–99)
Glucose-Capillary: 85 mg/dL (ref 70–99)
Glucose-Capillary: 70 mg/dL (ref 70–99)
Glucose-Capillary: 175 mg/dL — ABNORMAL HIGH (ref 70–99)
Glucose-Capillary: 148 mg/dL — ABNORMAL HIGH (ref 70–99)

## 2021-02-02 LAB — COMPREHENSIVE METABOLIC PANEL
ALT: 14 U/L (ref 0–44)
AST: 17 U/L (ref 15–41)
Albumin: 2.5 g/dL — ABNORMAL LOW (ref 3.5–5.0)
Alkaline Phosphatase: 176 U/L — ABNORMAL HIGH (ref 38–126)
Anion gap: 12 (ref 5–15)
BUN: 27 mg/dL — ABNORMAL HIGH (ref 8–23)
CO2: 25 mmol/L (ref 22–32)
Calcium: 6.8 mg/dL — ABNORMAL LOW (ref 8.9–10.3)
Chloride: 99 mmol/L (ref 98–111)
Creatinine, Ser: 2.49 mg/dL — ABNORMAL HIGH (ref 0.61–1.24)
GFR, Estimated: 26 mL/min — ABNORMAL LOW (ref >60)
Glucose, Bld: 52 mg/dL — ABNORMAL LOW (ref 70–99)
Potassium: 3.1 mmol/L — ABNORMAL LOW (ref 3.5–5.1)
Sodium: 136 mmol/L (ref 135–145)
Total Bilirubin: 0.5 mg/dL (ref 0.3–1.2)
Total Protein: 6.2 g/dL — ABNORMAL LOW (ref 6.5–8.1)

## 2021-02-02 LAB — URINALYSIS, ROUTINE W REFLEX MICROSCOPIC
Bilirubin Urine: NEGATIVE
Glucose, UA: NEGATIVE mg/dL
Ketones, ur: NEGATIVE mg/dL
Nitrite: NEGATIVE
Protein, ur: 100 mg/dL — AB
Specific Gravity, Urine: 1.009 (ref 1.005–1.030)
WBC, UA: >50 WBC/hpf — ABNORMAL HIGH (ref 0–5)
pH: 5 (ref 5.0–8.0)

## 2021-02-02 LAB — GLUCOSE, CAPILLARY: Glucose-Capillary: 137 mg/dL — ABNORMAL HIGH (ref 70–99)

## 2021-02-02 LAB — PROCALCITONIN: Procalcitonin: 1.72 ng/mL

## 2021-02-02 LAB — RESP PANEL BY RT-PCR (FLU A&B, COVID) ARPGX2
Influenza A by PCR: NEGATIVE
Influenza B by PCR: NEGATIVE
SARS Coronavirus 2 by RT PCR: NEGATIVE

## 2021-02-02 LAB — PROTIME-INR
INR: 1.5 — ABNORMAL HIGH (ref 0.8–1.2)
Prothrombin Time: 18.1 seconds — ABNORMAL HIGH (ref 11.4–15.2)

## 2021-02-02 LAB — LACTIC ACID, PLASMA
Lactic Acid, Venous: 1.6 mmol/L (ref 0.5–1.9)
Lactic Acid, Venous: 1.1 mmol/L (ref 0.5–1.9)

## 2021-02-02 LAB — POC OCCULT BLOOD, ED: Fecal Occult Bld: NEGATIVE

## 2021-02-02 MED ORDER — SODIUM CHLORIDE 0.9 % IV SOLN
2.0000 g | Freq: Once | INTRAVENOUS | Status: DC
  Filled 2021-02-02: qty 2

## 2021-02-02 MED ORDER — CALCITRIOL 0.25 MCG PO CAPS
0.2500 ug | ORAL_CAPSULE | Freq: Every day | ORAL | Status: DC
  Administered 2021-02-03 – 2021-02-06 (×4): 0.25 ug via ORAL
  Filled 2021-02-02 (×4): qty 1

## 2021-02-02 MED ORDER — VANCOMYCIN HCL IN DEXTROSE 1-5 GM/200ML-% IV SOLN
1000.0000 mg | Freq: Once | INTRAVENOUS | Status: DC
  Filled 2021-02-02: qty 200

## 2021-02-02 MED ORDER — ACETAMINOPHEN 325 MG PO TABS
650.0000 mg | ORAL_TABLET | Freq: Four times a day (QID) | ORAL | Status: DC | PRN
  Administered 2021-02-03: 650 mg via ORAL
  Filled 2021-02-02: qty 2

## 2021-02-02 MED ORDER — METRONIDAZOLE 500 MG/100ML IV SOLN
500.0000 mg | Freq: Once | INTRAVENOUS | Status: AC
Start: 2021-02-02 — End: 2021-02-02
  Administered 2021-02-02: 500 mg via INTRAVENOUS
  Filled 2021-02-02: qty 100

## 2021-02-02 MED ORDER — METOPROLOL TARTRATE 50 MG PO TABS
50.0000 mg | ORAL_TABLET | Freq: Two times a day (BID) | ORAL | Status: DC
  Administered 2021-02-03 – 2021-02-06 (×6): 50 mg via ORAL
  Filled 2021-02-02 (×7): qty 1

## 2021-02-02 MED ORDER — PANCRELIPASE (LIP-PROT-AMYL) 12000-38000 UNITS PO CPEP
12000.0000 [IU] | ORAL_CAPSULE | Freq: Three times a day (TID) | ORAL | Status: DC
  Administered 2021-02-03 – 2021-02-06 (×10): 12000 [IU] via ORAL
  Filled 2021-02-02 (×10): qty 1

## 2021-02-02 MED ORDER — DEXTROSE 50 % IV SOLN: 1.0000 | Freq: Once | INTRAVENOUS | Status: AC

## 2021-02-02 MED ORDER — LACTATED RINGERS IV BOLUS (SEPSIS)
500.0000 mL | Freq: Once | INTRAVENOUS | Status: AC
  Administered 2021-02-02: 500 mL via INTRAVENOUS

## 2021-02-02 MED ORDER — SODIUM CHLORIDE 0.9 % IV SOLN
2.0000 g | INTRAVENOUS | Status: DC
  Administered 2021-02-02: 2 g via INTRAVENOUS

## 2021-02-02 MED ORDER — ONDANSETRON HCL 4 MG/2ML IJ SOLN: 4.0000 mg | Freq: Four times a day (QID) | INTRAMUSCULAR | Status: DC | PRN

## 2021-02-02 MED ORDER — DEXTROSE IN LACTATED RINGERS 5 % IV SOLN: INTRAVENOUS | Status: DC

## 2021-02-02 MED ORDER — SODIUM CHLORIDE 0.9 % IV SOLN
2.0000 g | INTRAVENOUS | Status: DC
  Administered 2021-02-02 – 2021-02-05 (×4): 2 g via INTRAVENOUS
  Filled 2021-02-02 (×4): qty 20

## 2021-02-02 MED ORDER — DEXTROSE 50 % IV SOLN
INTRAVENOUS | Status: AC
  Administered 2021-02-02: 50 mL via INTRAVENOUS
  Filled 2021-02-02: qty 50

## 2021-02-02 MED ORDER — ONDANSETRON HCL 4 MG PO TABS: 4.0000 mg | ORAL_TABLET | Freq: Four times a day (QID) | ORAL | Status: DC | PRN

## 2021-02-02 MED ORDER — DILTIAZEM HCL ER COATED BEADS 180 MG PO CP24
180.0000 mg | ORAL_CAPSULE | Freq: Every day | ORAL | Status: DC
  Administered 2021-02-03 – 2021-02-06 (×3): 180 mg via ORAL
  Filled 2021-02-02 (×4): qty 1

## 2021-02-02 MED ORDER — ASPIRIN EC 81 MG PO TBEC
81.0000 mg | DELAYED_RELEASE_TABLET | Freq: Every day | ORAL | Status: DC
  Administered 2021-02-03 – 2021-02-05 (×3): 81 mg via ORAL
  Filled 2021-02-02 (×3): qty 1

## 2021-02-02 MED ORDER — DEXTROSE 50 % IV SOLN
1.0000 | Freq: Once | INTRAVENOUS | Status: AC
  Administered 2021-02-02: 50 mL via INTRAVENOUS

## 2021-02-02 MED ORDER — HEPARIN SODIUM (PORCINE) 5000 UNIT/ML IJ SOLN
5000.0000 [IU] | Freq: Three times a day (TID) | INTRAMUSCULAR | Status: DC
  Administered 2021-02-02 – 2021-02-06 (×9): 5000 [IU] via SUBCUTANEOUS
  Filled 2021-02-02 (×10): qty 1

## 2021-02-02 MED ORDER — ALBUTEROL SULFATE HFA 108 (90 BASE) MCG/ACT IN AERS: 2.0000 | INHALATION_SPRAY | RESPIRATORY_TRACT | Status: DC | PRN

## 2021-02-02 MED ORDER — VANCOMYCIN HCL 1750 MG/350ML IV SOLN
1750.0000 mg | Freq: Once | INTRAVENOUS | Status: DC
  Administered 2021-02-02: 1750 mg via INTRAVENOUS
  Filled 2021-02-02: qty 350

## 2021-02-02 MED ORDER — DEXTROSE 50 % IV SOLN
INTRAVENOUS | Status: AC
  Filled 2021-02-02: qty 50

## 2021-02-02 MED ORDER — LACTATED RINGERS IV SOLN: INTRAVENOUS | Status: DC

## 2021-02-02 MED ORDER — VANCOMYCIN HCL 1250 MG/250ML IV SOLN: 1250.0000 mg | INTRAVENOUS | Status: DC

## 2021-02-02 MED ORDER — ACETAMINOPHEN 650 MG RE SUPP: 650.0000 mg | Freq: Four times a day (QID) | RECTAL | Status: DC | PRN

## 2021-02-02 MED ORDER — HYDROXYZINE HCL 10 MG PO TABS
10.0000 mg | ORAL_TABLET | Freq: Two times a day (BID) | ORAL | Status: DC | PRN
  Filled 2021-02-02: qty 1

## 2021-02-02 MED ORDER — SACCHAROMYCES BOULARDII 250 MG PO CAPS
250.0000 mg | ORAL_CAPSULE | Freq: Two times a day (BID) | ORAL | Status: DC
  Administered 2021-02-03 – 2021-02-06 (×7): 250 mg via ORAL
  Filled 2021-02-02 (×7): qty 1

## 2021-02-02 MED ORDER — PRAVASTATIN SODIUM 10 MG PO TABS
10.0000 mg | ORAL_TABLET | Freq: Every day | ORAL | Status: DC
  Administered 2021-02-03 – 2021-02-06 (×4): 10 mg via ORAL
  Filled 2021-02-02 (×4): qty 1

## 2021-02-02 NOTE — ED Notes (Signed)
First set of cultures collected before antibiotics started.

## 2021-02-02 NOTE — ED Triage Notes (Signed)
Pt arrived by [facility] . Pt was at dialysis and received 45 min of tx. Pt became altered during tx and EMS was called. CBG noted to be 36 on their arrival. 1 mg glucagon given by EMS. Next cbg 88. Now in triage cbg is 48. 1 amp D50 being given at this time. Verbal order given by MD.

## 2021-02-02 NOTE — Progress Notes (Signed)
[facility] is following this Code Sepsis.

## 2021-02-02 NOTE — Progress Notes (Signed)
Wife updated via phone

## 2021-02-02 NOTE — ED Notes (Signed)
Wife given update.

## 2021-02-02 NOTE — H&P (Signed)
History and Physical  [name] EUM:[id number] DOB: [birth year] DOA: 02/02/2021  Referring physician: Dr Rogene Houston, ED physician PCP: [name], MD  Outpatient Specialists:   Patient Coming From: SNF  Chief Complaint: Hypoglycemia, fever, decreased responsiveness  HPI: Mark Jumbo Sr. is a 80 y.o. male with a history of type 2 diabetes on insulin, hypertension, end-stage renal disease secondary to diabetic nephropathy, nonalcoholic fatty liver disease, CAD, grade 1 diastolic dysfunction.  Patient was receiving dialysis earlier today and the staff noticed that he was not as responsive as he normally has.  He did have a fever and was sent to the hospital for evaluation by EMS.  CBG was noted to be 36 on their arrival.  They gave 1 amp of glucagon with a response of CBG 88.  On arrival here, his CBG was 48 and another amp of D50 was given.  D5 LR was added.  Patient continues to have variable mentation.  Initial temperature was 101.  Blood cultures obtained and the patient was started on broad-spectrum antibiotics.  Initial lactic acid was 1.6.  White count 17.4.  Bladder scan was done showing about 600 mL of urine in his bladder.  UA collected which is suggestive of bladder infection.  Review of Systems:  Patient unable to provide  Past Medical History:  Diagnosis Date   Arthritis    lower back   Chronic back pain    Disc disease   Chronic kidney disease    Coronary atherosclerosis of native coronary artery    Coronary calcifications by chest CT, Myoview demonstrating inferolateral scar   Deafness in right ear    Diabetes mellitus    Diabetic retinopathy (HCC)    moderate nonproliferative with macular edema in right eye   Diastolic dysfunction 12/4006   Grade 1.   Essential hypertension, benign    Heart murmur    in past   HOH (hard of hearing)    Hyperkalemia    Kidney stones    Mixed hyperlipidemia    NAFLD (nonalcoholic fatty liver disease)    Pancreatic  insufficiency    Diagnosed at [facility]   Prostate cancer [facility]) 2011   Shortness of breath dyspnea    occasional - liver pressing on right lung, decreased capacity   Subdural hematoma (HCC)    Type 2 diabetes mellitus ([place])    Wears hearing aid    "crossover" form right to left   Past Surgical History:  Procedure Laterality Date   APPENDECTOMY     Bilateral hip replacement     CATARACT EXTRACTION W/PHACO Left 05/15/2015   Procedure: CATARACT EXTRACTION PHACO AND INTRAOCULAR LENS PLACEMENT ([place]);  Surgeon: Ronnell Freshwater, MD;  Location: [place];  Service: Ophthalmology;  Laterality: Left;  DIABETIC - insulin pump and oral meds   CATARACT EXTRACTION W/PHACO Right 08/28/2015   Procedure: CATARACT EXTRACTION PHACO AND INTRAOCULAR LENS PLACEMENT ([facility]);  Surgeon: Ronnell Freshwater, MD;  Location: [place];  Service: Ophthalmology;  Laterality: Right;  DIABETIC PER PT AND CINDY PLEASE KEEP ARRIVAL TIME AFTER 8AM    CHOLECYSTECTOMY     HERNIA REPAIR     INSERTION OF DIALYSIS CATHETER Right 01/22/2021   Procedure: INSERTION OF DIALYSIS CATHETER;  Surgeon: Virl Cagey, MD;  Location: [facility];  Service: General;  Laterality: Right;   PROSTATECTOMY  2011   Social History:  reports that he has never smoked. He has never used smokeless tobacco. He reports that he does not  drink alcohol and does not use drugs. Patient lives at skilled nursing facility at the moment  Allergies  Allergen Reactions   Enalapril Maleate Cough    Family History  Problem Relation Age of Onset   Diabetes type II Mother    Heart attack Father       Prior to Admission medications   Medication Sig Start Date End Date Taking? Authorizing Provider  albuterol (PROVENTIL HFA;VENTOLIN HFA) 108 (90 Base) MCG/ACT inhaler Inhale 2 puffs into the lungs every 4 (four) hours as needed for wheezing or shortness of breath. 07/24/18   [name], MD  aspirin EC 81 MG tablet Take 81  mg by mouth at bedtime.     [provider]  calcitRIOL (ROCALTROL) 0.25 MCG capsule Take 1 capsule (0.25 mcg total) by mouth daily. 01/26/21   Barton Dubois, MD  Continuous Blood Gluc Receiver (FREESTYLE LIBRE 2 READER) DEVI Use 1 Device as directed 04/17/20   [provider]  Darbepoetin Alfa (ARANESP) 150 MCG/0.3ML SOSY injection Inject 0.3 mLs (150 mcg total) into the skin every Tuesday at 6 PM. 01/30/21   Barton Dubois, MD  diltiazem (CARDIZEM CD) 180 MG 24 hr capsule Take 1 capsule (180 mg total) by mouth daily. 01/26/21   Barton Dubois, MD  ergocalciferol (VITAMIN D2) 1.25 MG (50000 UT) capsule Take 50,000 Units by mouth once a week.    [provider]  hydrOXYzine (ATARAX/VISTARIL) 10 MG tablet Take 1 tablet (10 mg total) by mouth every 12 (twelve) hours as needed for itching or anxiety. 01/25/21   Barton Dubois, MD  insulin glargine (LANTUS) 100 UNIT/ML injection Inject 0.08 mLs (8 Units total) into the skin at bedtime. 01/25/21   Barton Dubois, MD  insulin regular (NOVOLIN R,HUMULIN R) 100 units/mL injection Inject 8 Units into the skin 3 (three) times daily before meals. 12u am  10u supper    [provider]  metoprolol tartrate (LOPRESSOR) 50 MG tablet Take 1 tablet (50 mg total) by mouth 2 (two) times daily. 01/25/21   Barton Dubois, MD  Multiple Vitamin (MULTIVITAMIN WITH MINERALS) TABS Take 1 tablet by mouth daily.    [provider]  pravastatin (PRAVACHOL) 10 MG tablet TAKE 1 TABLET(10 MG) BY MOUTH DAILY WITH BREAKFAST 12/04/20   [name], MD  saccharomyces boulardii (FLORASTOR) 250 MG capsule Take 1 capsule (250 mg total) by mouth 2 (two) times daily for 15 days. 01/25/21 02/09/21  Barton Dubois, MD  ZENPEP 10000-32000 units CPEP TAKE 1 CAPSULE BY MOUTH THREE TIMES DAILY BEFORE MEALS 05/10/20   [name], MD    Physical Exam: BP 125/64   Pulse (!) 104   Temp (!) 100.6 F (38.1 C)   Resp (!) 27   Ht 5' 8"  (1.727 m)   Wt  78.7 kg   SpO2 100%   BMI 26.38 kg/m   General: Elderly male.  Somnolent, but arouses briefly to verbal stimulation. No acute cardiopulmonary distress.  HEENT: Normocephalic atraumatic.  Right and left ears normal in appearance.  Pupils equal, round, reactive to light. Extraocular muscles are intact. Sclerae anicteric and noninjected.  Moist mucosal membranes. No mucosal lesions.  Neck: Neck supple without lymphadenopathy. No carotid bruits. No masses palpated.  Cardiovascular: Regular rate with normal S1-S2 sounds. No murmurs, rubs, gallops auscultated. No JVD.  Respiratory: Good respiratory effort with no wheezes, rales, rhonchi. Lungs clear to auscultation bilaterally.  No accessory muscle use. Abdomen: Soft, nontender, nondistended.  Mild suprapubic tenderness to palpation.  Active bowel sounds. No masses or hepatosplenomegaly  Skin: No rashes, lesions, or ulcerations.  Dry, warm to touch. 2+ dorsalis pedis and radial pulses. Musculoskeletal: No calf or leg pain. All major joints not erythematous nontender.  No upper or lower joint deformation.  Good ROM.  No contractures  Psychiatric: Intact judgment and insight. Pleasant and cooperative. Neurologic: No focal neurological deficits. Strength is 5/5 and symmetric in upper and lower extremities.  Cranial nerves II through XII are grossly intact.           Labs on Admission: I have personally reviewed following labs and imaging studies  CBC: Recent Labs  Lab 02/02/21 1420  WBC 17.4*  NEUTROABS 16.2*  HGB 9.0*  HCT 29.6*  MCV 99.7  PLT 831   Basic Metabolic Panel: Recent Labs  Lab 02/02/21 1420  NA 136  K 3.1*  CL 99  CO2 25  GLUCOSE 52*  BUN 27*  CREATININE 2.49*  CALCIUM 6.8*   GFR: Estimated Creatinine Clearance: 23.3 mL/min (A) (by C-G formula based on SCr of 2.49 mg/dL (H)). Liver Function Tests: Recent Labs  Lab 02/02/21 1420  AST 17  ALT 14  ALKPHOS 176*  BILITOT 0.5  PROT 6.2*  ALBUMIN 2.5*   No  results for input(s): LIPASE, AMYLASE in the last 168 hours. No results for input(s): AMMONIA in the last 168 hours. Coagulation Profile: Recent Labs  Lab 02/02/21 1420  INR 1.5*   Cardiac Enzymes: No results for input(s): CKTOTAL, CKMB, CKMBINDEX, TROPONINI in the last 168 hours. BNP (last 3 results) No results for input(s): PROBNP in the last 8760 hours. HbA1C: No results for input(s): HGBA1C in the last 72 hours. CBG: Recent Labs  Lab 02/02/21 1355 02/02/21 1420 02/02/21 1548 02/02/21 1619  GLUCAP 48* 143* 85 80   Lipid Profile: No results for input(s): CHOL, HDL, LDLCALC, TRIG, CHOLHDL, LDLDIRECT in the last 72 hours. Thyroid Function Tests: No results for input(s): TSH, T4TOTAL, FREET4, T3FREE, THYROIDAB in the last 72 hours. Anemia Panel: No results for input(s): VITAMINB12, FOLATE, FERRITIN, TIBC, IRON, RETICCTPCT in the last 72 hours. Urine analysis:    Component Value Date/Time   COLORURINE YELLOW 02/02/2021 1450   APPEARANCEUR TURBID (A) 02/02/2021 1450   LABSPEC 1.009 02/02/2021 1450   PHURINE 5.0 02/02/2021 1450   GLUCOSEU NEGATIVE 02/02/2021 1450   HGBUR MODERATE (A) 02/02/2021 1450   BILIRUBINUR NEGATIVE 02/02/2021 1450   KETONESUR NEGATIVE 02/02/2021 1450   PROTEINUR 100 (A) 02/02/2021 1450   UROBILINOGEN 0.2 03/20/2013 2016   NITRITE NEGATIVE 02/02/2021 1450   LEUKOCYTESUR LARGE (A) 02/02/2021 1450   Sepsis Labs: @LABRCNTIP (procalcitonin:4,lacticidven:4) ) Recent Results (from the past 240 hour(s))  Resp Panel by RT-PCR (Flu A&B, Covid) Urine, Catheterized     Status: None   Collection Time: 02/02/21  2:30 PM   Specimen: Urine, Catheterized; Nasopharyngeal(NP) swabs in vial transport medium  Result Value Ref Range Status   SARS Coronavirus 2 by RT PCR NEGATIVE NEGATIVE Final    Comment: (NOTE) SARS-CoV-2 target nucleic acids are NOT DETECTED.  The SARS-CoV-2 RNA is generally detectable in upper respiratory specimens during the acute phase of  infection. The lowest concentration of SARS-CoV-2 viral copies this assay can detect is 138 copies/mL. A negative result does not preclude SARS-Cov-2 infection and should not be used as the sole basis for treatment or other patient management decisions. A negative result may occur with  improper specimen collection/handling, submission of specimen other than nasopharyngeal swab, presence of viral mutation(s) within  the areas targeted by this assay, and inadequate number of viral copies(<138 copies/mL). A negative result must be combined with clinical observations, patient history, and epidemiological information. The expected result is Negative.  Fact Sheet for Patients:  EntrepreneurPulse.com.au  Fact Sheet for Healthcare Providers:  IncredibleEmployment.be  This test is no t yet approved or cleared by the [place] FDA and  has been authorized for detection and/or diagnosis of SARS-CoV-2 by FDA under an Emergency Use Authorization (EUA). This EUA will remain  in effect (meaning this test can be used) for the duration of the COVID-19 declaration under Section 564(b)(1) of the Act, 21 U.S.C.section 360bbb-3(b)(1), unless the authorization is terminated  or revoked sooner.       Influenza A by PCR NEGATIVE NEGATIVE Final   Influenza B by PCR NEGATIVE NEGATIVE Final    Comment: (NOTE) The Xpert Xpress SARS-CoV-2/FLU/RSV plus assay is intended as an aid in the diagnosis of influenza from Nasopharyngeal swab specimens and should not be used as a sole basis for treatment. Nasal washings and aspirates are unacceptable for Xpert Xpress SARS-CoV-2/FLU/RSV testing.  Fact Sheet for Patients: EntrepreneurPulse.com.au  Fact Sheet for Healthcare Providers: IncredibleEmployment.be  This test is not yet approved or cleared by the [place] FDA and has been authorized for detection and/or diagnosis of SARS-CoV-2  by FDA under an Emergency Use Authorization (EUA). This EUA will remain in effect (meaning this test can be used) for the duration of the COVID-19 declaration under Section 564(b)(1) of the Act, 21 U.S.C. section 360bbb-3(b)(1), unless the authorization is terminated or revoked.  Performed at [facility], [address], [place], [place] [address]   Blood Culture (routine x 2)     Status: None (Preliminary result)   Collection Time: 02/02/21  2:36 PM   Specimen: BLOOD  Result Value Ref Range Status   Specimen Description BLOOD LEFT ARM  Final   Special Requests   Final    BOTTLES DRAWN AEROBIC AND ANAEROBIC Blood Culture adequate volume Performed at [facility], [address] [place], [place] [address]    Culture PENDING  Incomplete   Report Status PENDING  Incomplete  Blood Culture (routine x 2)     Status: None (Preliminary result)   Collection Time: 02/02/21  2:36 PM   Specimen: BLOOD  Result Value Ref Range Status   Specimen Description BLOOD RIGHT HAND  Final   Special Requests   Final    BOTTLES DRAWN AEROBIC AND ANAEROBIC Blood Culture results may not be optimal due to an inadequate volume of blood received in culture bottles Performed at [facility], [address] [place], [place] [address]    Culture PENDING  Incomplete   Report Status PENDING  Incomplete     Radiological Exams on Admission: CT Head Wo Contrast  Result Date: 02/02/2021 CLINICAL DATA:  Altered mental status. EXAM: CT HEAD WITHOUT CONTRAST TECHNIQUE: Contiguous axial images were obtained from the base of the skull through the vertex without intravenous contrast. COMPARISON:  Head CT 12/23/2020 FINDINGS: Brain: Brain volume is normal for age. There is moderate chronic small vessel ischemia that is not significantly changed from prior. No intracranial hemorrhage, mass effect, or midline shift. No hydrocephalus. The basilar cisterns are patent. No evidence of territorial infarct or acute ischemia. No  extra-axial or intracranial fluid collection. Vascular: No hyperdense vessel. There is skull base atherosclerosis. Skull: No fracture or focal lesion. Sinuses/Orbits: Mucous retention cyst in the right maxillary sinus. Bilateral cataract resection. Other: Prominent subcutaneous calcifications  typical of diabetes. IMPRESSION: 1. No acute intracranial abnormality. 2. Unchanged chronic small vessel ischemia. Electronically Signed   By: Keith Rake M.D.   On: 02/02/2021 16:02   DG Chest Port 1 View  Result Date: 02/02/2021 CLINICAL DATA:  Altered mental status.  Possible sepsis. EXAM: PORTABLE CHEST 1 VIEW COMPARISON:  01/22/2021 and prior radiographs FINDINGS: This is a low volume study with mild bibasilar atelectasis. Cardiomediastinal silhouette is unchanged. A RIGHT IJ central venous catheter with tip overlying the SUPERIOR cavoatrial junction noted. No pleural effusion or pneumothorax noted. IMPRESSION: Low volume study with mild bibasilar atelectasis. Electronically Signed   By: Margarette Canada M.D.   On: 02/02/2021 15:17     Assessment/Plan: Principal Problem:   Sepsis ([place]) Active Problems:   Essential hypertension, benign   DM type 2 with diabetic peripheral neuropathy (HCC)   Metabolic encephalopathy   ESRD (end stage renal disease) ([place])   Hypoglycemia   Acute lower UTI    This patient was discussed with the ED physician, including pertinent vitals, physical exam findings, labs, and imaging.  We also discussed care given by the ED provider.  Sepsis from UTI Admit to stepdown Procalcitonin now and in the morning Repeat lactic acid Will narrow antibiotics to Rocephin -the patient did have a recent admission to the hospital for UTI with a urine culture that was sensitive to Rocephin CBC in the morning Metabolic encephalopathy Likely secondary to sepsis and hypoglycemia Antibiotics as above, will increase D5 LR to 100 mL/h Hypoglycemia with diabetes on insulin Increase D5 LR to 100  mL/h Check CBG every 2 hours Hold insulin Hypertension Continue antihypertensives End-stage renal disease Consult nephrology for dialysis  DVT prophylaxis: Heparin Consultants: None Code Status: Full code presumed Family Communication: None Disposition Plan: Patient will likely need to return to SNF   Truett Mainland, DO

## 2021-02-02 NOTE — ED Provider Notes (Signed)
[facility] EMERGENCY DEPARTMENT Provider Note   CSN: [id number] Arrival date & time: 02/02/21  1351     History Chief Complaint  Patient presents with   Hypoglycemia    Mark L Heron Sr. is a 80 y.o. male.  Patient sent in from dialysis.  Patient normally dialyzed Monday Wednesday Fridays.  Patient is a resident at [facility].  Dialysis center stated that patient did not seem quite himself upon arrival.  Seemed to be not as alert.  Normally he is dialyzed for 3 hours he received an hour and he became very confused.  Blood sugar was checked and it was 38.  EMS contacted patient received IV glucagon.  Blood sugar came up but upon arrival here blood sugar was in was 48.  Patient received an amp of D50.  I saw the patient.  Patient a little more awake with follow some commands but did not seem to be baseline at all.  Patient seemed very drowsy.  Patient felt hot.  and abdomen was distended in the lower part of the abdomen felt to be bladder distention.  Patient not hypotensive.  Sepsis criteria initiated.  Patient will not receive the full 30 cc/kg fluid challenge at this time.  1 due to the fact that he is a dialysis patient is not hypotensive.  We will see what we get on lactic acid and white blood cell count.      Past Medical History:  Diagnosis Date   Arthritis    lower back   Chronic back pain    Disc disease   Chronic kidney disease    Coronary atherosclerosis of native coronary artery    Coronary calcifications by chest CT, Myoview demonstrating inferolateral scar   Deafness in right ear    Diabetes mellitus    Diabetic retinopathy (HCC)    moderate nonproliferative with macular edema in right eye   Diastolic dysfunction 01/2702   Grade 1.   Essential hypertension, benign    Heart murmur    in past   HOH (hard of hearing)    Hyperkalemia    Kidney stones    Mixed hyperlipidemia    NAFLD (nonalcoholic fatty liver disease)    Pancreatic insufficiency    Diagnosed at  [facility]   Prostate cancer [facility]) 2011   Shortness of breath dyspnea    occasional - liver pressing on right lung, decreased capacity   Subdural hematoma (HCC)    Type 2 diabetes mellitus ([place])    Wears hearing aid    "crossover" form right to left    Patient Active Problem List   Diagnosis Date Noted   Metabolic encephalopathy 50/04/3817   ESRD (end stage renal disease) ([place]) 02/02/2021   Hypoglycemia 02/02/2021   Sepsis ([place]) 02/02/2021   Vascular dialysis catheter in place [facility])    Shingles 01/14/2021   Pressure injury of skin 01/09/2021   SVT (supraventricular tachycardia) ([place]) 01/07/2021   Acute renal failure superimposed on stage 3b chronic kidney disease ([place]) 29/93/7169   Acute metabolic encephalopathy 67/89/3810   COVID-19 virus infection 17/51/0258   Metabolic acidosis    AKI (acute kidney injury) ([place]) 12/23/2020   Hyperkalemia 12/02/2018   Fever, unknown origin 12/02/2018   Altered mental state 12/02/2018   Acute on chronic renal failure ([place]) 12/02/2018   Pancreatic insufficiency 12/02/2018   Wears hearing aid 12/02/2018   HOH (hard of hearing) 12/02/2018   DM type 2 with diabetic peripheral neuropathy ([place]) 09/15/2018   Uncontrolled type 2 diabetes mellitus  with hyperglycemia ([place]) 09/15/2018   Peripheral edema 06/25/2018   NAFLD (nonalcoholic fatty liver disease)    Diabetic retinopathy ([place])    ED (erectile dysfunction) 01/05/2014   Shortness of breath 12/05/2011   Coronary atherosclerosis of native coronary artery 12/05/2011   Type 2 diabetes mellitus ([place]) 12/05/2011   Essential hypertension, benign 12/05/2011   Mixed hyperlipidemia 12/05/2011   Malignant neoplasm of prostate ([place]) 06/26/2011    Past Surgical History:  Procedure Laterality Date   APPENDECTOMY     Bilateral hip replacement     CATARACT EXTRACTION W/PHACO Left 05/15/2015   Procedure: CATARACT EXTRACTION PHACO AND INTRAOCULAR LENS PLACEMENT ([place]);  Surgeon: Ronnell Freshwater, MD;   Location: [place];  Service: Ophthalmology;  Laterality: Left;  DIABETIC - insulin pump and oral meds   CATARACT EXTRACTION W/PHACO Right 08/28/2015   Procedure: CATARACT EXTRACTION PHACO AND INTRAOCULAR LENS PLACEMENT (IOC);  Surgeon: Ronnell Freshwater, MD;  Location: [place];  Service: Ophthalmology;  Laterality: Right;  DIABETIC PER PT AND CINDY PLEASE KEEP ARRIVAL TIME AFTER 8AM    CHOLECYSTECTOMY     HERNIA REPAIR     INSERTION OF DIALYSIS CATHETER Right 01/22/2021   Procedure: INSERTION OF DIALYSIS CATHETER;  Surgeon: Virl Cagey, MD;  Location: [facility];  Service: General;  Laterality: Right;   PROSTATECTOMY  2011       Family History  Problem Relation Age of Onset   Diabetes type II Mother    Heart attack Father     Social History   Tobacco Use   Smoking status: Never   Smokeless tobacco: Never  Substance Use Topics   Alcohol use: No   Drug use: No    Home Medications Prior to Admission medications   Medication Sig Start Date End Date Taking? Authorizing Provider  albuterol (PROVENTIL HFA;VENTOLIN HFA) 108 (90 Base) MCG/ACT inhaler Inhale 2 puffs into the lungs every 4 (four) hours as needed for wheezing or shortness of breath. 07/24/18   [name], MD  aspirin EC 81 MG tablet Take 81 mg by mouth at bedtime.     [provider]  calcitRIOL (ROCALTROL) 0.25 MCG capsule Take 1 capsule (0.25 mcg total) by mouth daily. 01/26/21   Barton Dubois, MD  Continuous Blood Gluc Receiver (FREESTYLE LIBRE 2 READER) DEVI Use 1 Device as directed 04/17/20   [provider]  Darbepoetin Alfa (ARANESP) 150 MCG/0.3ML SOSY injection Inject 0.3 mLs (150 mcg total) into the skin every Tuesday at 6 PM. 01/30/21   Barton Dubois, MD  diltiazem (CARDIZEM CD) 180 MG 24 hr capsule Take 1 capsule (180 mg total) by mouth daily. 01/26/21   Barton Dubois, MD  ergocalciferol (VITAMIN D2) 1.25 MG (50000 UT) capsule Take 50,000 Units by mouth  once a week.    [provider]  hydrOXYzine (ATARAX/VISTARIL) 10 MG tablet Take 1 tablet (10 mg total) by mouth every 12 (twelve) hours as needed for itching or anxiety. 01/25/21   Barton Dubois, MD  insulin glargine (LANTUS) 100 UNIT/ML injection Inject 0.08 mLs (8 Units total) into the skin at bedtime. 01/25/21   Barton Dubois, MD  insulin regular (NOVOLIN R,HUMULIN R) 100 units/mL injection Inject 8 Units into the skin 3 (three) times daily before meals. 12u am  10u supper    [provider]  metoprolol tartrate (LOPRESSOR) 50 MG tablet Take 1 tablet (50 mg total) by mouth 2 (two) times daily. 01/25/21   Barton Dubois, MD  Multiple Vitamin (MULTIVITAMIN  WITH MINERALS) TABS Take 1 tablet by mouth daily.    [provider]  pravastatin (PRAVACHOL) 10 MG tablet TAKE 1 TABLET(10 MG) BY MOUTH DAILY WITH BREAKFAST 12/04/20   [name], MD  saccharomyces boulardii (FLORASTOR) 250 MG capsule Take 1 capsule (250 mg total) by mouth 2 (two) times daily for 15 days. 01/25/21 02/09/21  Barton Dubois, MD  ZENPEP 10000-32000 units CPEP TAKE 1 CAPSULE BY MOUTH THREE TIMES DAILY BEFORE MEALS 05/10/20   [name], MD    Allergies    Enalapril maleate  Review of Systems   Review of Systems  Unable to perform ROS: Mental status change   Physical Exam Updated Vital Signs BP 125/64   Pulse (!) 104   Temp (!) 100.6 F (38.1 C)   Resp (!) 27   Ht 1.727 m (_0 )   Wt 78.7 kg   SpO2 100%   BMI 26.38 kg/m   Physical Exam Vitals and nursing note reviewed.  Constitutional:      General: He is in acute distress.     Appearance: He is well-developed.  HENT:     Head: Normocephalic and atraumatic.     Mouth/Throat:     Mouth: Mucous membranes are dry.  Eyes:     Conjunctiva/sclera: Conjunctivae normal.     Pupils: Pupils are equal, round, and reactive to light.  Cardiovascular:     Rate and Rhythm: Normal rate and regular rhythm.     Heart sounds: No murmur  heard. Pulmonary:     Effort: Pulmonary effort is normal. No respiratory distress.     Breath sounds: Normal breath sounds. No wheezing.  Abdominal:     General: There is distension.     Palpations: Abdomen is soft.     Tenderness: There is no abdominal tenderness. There is no guarding.  Musculoskeletal:        General: No swelling.     Cervical back: Neck supple.  Skin:    General: Skin is warm and dry.  Neurological:     Mental Status: He is alert.     Comments: Patient will open his eyes.  Patient will follow commands like raising his arms and raising his legs.  Verbalizing some but seems confused.  And not very alert.    ED Results / Procedures / Treatments   Labs (all labs ordered are listed, but only abnormal results are displayed) Labs Reviewed  COMPREHENSIVE METABOLIC PANEL - Abnormal; Notable for the following components:      Result Value   Potassium 3.1 (*)    Glucose, Bld 52 (*)    BUN 27 (*)    Creatinine, Ser 2.49 (*)    Calcium 6.8 (*)    Total Protein 6.2 (*)    Albumin 2.5 (*)    Alkaline Phosphatase 176 (*)    GFR, Estimated 26 (*)    All other components within normal limits  CBC WITH DIFFERENTIAL/PLATELET - Abnormal; Notable for the following components:   WBC 17.4 (*)    RBC 2.97 (*)    Hemoglobin 9.0 (*)    HCT 29.6 (*)    RDW 16.5 (*)    Neutro Abs 16.2 (*)    Lymphs Abs 0.3 (*)    Abs Immature Granulocytes 0.09 (*)    All other components within normal limits  PROTIME-INR - Abnormal; Notable for the following components:   Prothrombin Time 18.1 (*)    INR 1.5 (*)    All other  components within normal limits  URINALYSIS, ROUTINE W REFLEX MICROSCOPIC - Abnormal; Notable for the following components:   APPearance TURBID (*)    Hgb urine dipstick MODERATE (*)    Protein, ur 100 (*)    Leukocytes,Ua LARGE (*)    WBC, UA >50 (*)    Bacteria, UA MANY (*)    All other components within normal limits  CBG MONITORING, ED - Abnormal; Notable for  the following components:   Glucose-Capillary 48 (*)    All other components within normal limits  CBG MONITORING, ED - Abnormal; Notable for the following components:   Glucose-Capillary 143 (*)    All other components within normal limits  RESP PANEL BY RT-PCR (FLU A&B, COVID) ARPGX2  CULTURE, BLOOD (ROUTINE X 2)  CULTURE, BLOOD (ROUTINE X 2)  URINE CULTURE  LACTIC ACID, PLASMA  APTT  LACTIC ACID, PLASMA  PROCALCITONIN  POC OCCULT BLOOD, ED  CBG MONITORING, ED  CBG MONITORING, ED    EKG None  Radiology CT Head Wo Contrast  Result Date: 02/02/2021 CLINICAL DATA:  Altered mental status. EXAM: CT HEAD WITHOUT CONTRAST TECHNIQUE: Contiguous axial images were obtained from the base of the skull through the vertex without intravenous contrast. COMPARISON:  Head CT 12/23/2020 FINDINGS: Brain: Brain volume is normal for age. There is moderate chronic small vessel ischemia that is not significantly changed from prior. No intracranial hemorrhage, mass effect, or midline shift. No hydrocephalus. The basilar cisterns are patent. No evidence of territorial infarct or acute ischemia. No extra-axial or intracranial fluid collection. Vascular: No hyperdense vessel. There is skull base atherosclerosis. Skull: No fracture or focal lesion. Sinuses/Orbits: Mucous retention cyst in the right maxillary sinus. Bilateral cataract resection. Other: Prominent subcutaneous calcifications typical of diabetes. IMPRESSION: 1. No acute intracranial abnormality. 2. Unchanged chronic small vessel ischemia. Electronically Signed   By: Keith Rake M.D.   On: 02/02/2021 16:02   DG Chest Port 1 View  Result Date: 02/02/2021 CLINICAL DATA:  Altered mental status.  Possible sepsis. EXAM: PORTABLE CHEST 1 VIEW COMPARISON:  01/22/2021 and prior radiographs FINDINGS: This is a low volume study with mild bibasilar atelectasis. Cardiomediastinal silhouette is unchanged. A RIGHT IJ central venous catheter with tip overlying  the SUPERIOR cavoatrial junction noted. No pleural effusion or pneumothorax noted. IMPRESSION: Low volume study with mild bibasilar atelectasis. Electronically Signed   By: Margarette Canada M.D.   On: 02/02/2021 15:17    Procedures Procedures   CRITICAL CARE Performed by: Fredia Sorrow Total critical care time: 45 minutes Critical care time was exclusive of separately billable procedures and treating other patients. Critical care was necessary to treat or prevent imminent or life-threatening deterioration. Critical care was time spent personally by me on the following activities: development of treatment plan with patient and/or surrogate as well as nursing, discussions with consultants, evaluation of patient's response to treatment, examination of patient, obtaining history from patient or surrogate, ordering and performing treatments and interventions, ordering and review of laboratory studies, ordering and review of radiographic studies, pulse oximetry and re-evaluation of patient's condition.   Medications Ordered in ED Medications  dextrose 5 % in lactated ringers infusion ( Intravenous Rate/Dose Change 02/02/21 1630)  cefTRIAXone (ROCEPHIN) 2 g in sodium chloride 0.9 % 100 mL IVPB (has no administration in time range)  dextrose 50 % solution 50 mL (50 mLs Intravenous Given 02/02/21 1407)  metroNIDAZOLE (FLAGYL) IVPB 500 mg (0 mg Intravenous Stopped 02/02/21 1548)  lactated ringers bolus 500 mL (0 mLs Intravenous  Stopped 02/02/21 1514)    ED Course  I have reviewed the triage vital signs and the nursing notes.  Pertinent labs & imaging results that were available during my care of the patient were reviewed by me and considered in my medical decision making (see chart for details).    MDM Rules/Calculators/A&P                          Patient met sepsis criteria based on vital signs respiratory rate was up heart rate was up and febrile.  Code sepsis initiated did not order the 30 cc/kg fluid  challenge.  Since patient is a dialysis patient and not had completed dialysis.  Would wait to see what lactic acid and white blood cell count showed.  Lactic acid was not elevated.  White blood cell count was 17,000.  The bladder distention was confirmed by bladder scan which showed he had about 600+ cc in the bladder.  Foley catheter was placed.  Very purulent urine came out most likely the source.  Chest x-ray was negative.  Urine is been sent for culture.  Patient did receive 500 cc bolus of lactated Ringer's.  Then was started on D5 LR at 75 cc an hour due to the low blood sugar.  Patient was started on broad-spectrum antibiotics for unknown source.  Head CT was negative.  Discussed with nephrology.  They will follow.  But feel the patient does not need dialysis today.  And felt that the hour long dialysis that he got was probably very helpful patient's potassium was reassuring at 3.1.   Gust with hospitalist they will admit for sepsis.  Probably urosepsis. Final Clinical Impression(s) / ED Diagnoses Final diagnoses:  Sepsis, due to unspecified organism, unspecified whether acute organ dysfunction present [facility])  Hypoglycemia    Rx / DC Orders ED Discharge Orders     None        Fredia Sorrow, MD 02/02/21 1658

## 2021-02-02 NOTE — ED Notes (Signed)
Pt unable to sign MSE

## 2021-02-02 NOTE — Progress Notes (Signed)
Pharmacy Antibiotic Note  [name]. is a 80 y.o. male admitted on 02/02/2021 with  unknown source .  Pharmacy has been consulted for Vancomycin and cefepime dosing.  Plan: Vancomycin 1750 mg IV x 1 dose. Vancomycin 1250 mg IV every 48 hours. Cefepime 2000 mg IV every 24 hours. Monitor labs, c/s, and vanco level as indicated.  Height: 5' 8"  (172.7 cm) Weight: 78.7 kg (173 lb 8 oz) IBW/kg (Calculated) : 68.4  Temp (24hrs), Avg:100.8 F (38.2 C), Min:100.6 F (38.1 C), Max:101 F (38.3 C)  Recent Labs  Lab 02/02/21 1420  WBC 17.4*  CREATININE 2.49*    Estimated Creatinine Clearance: 23.3 mL/min (A) (by C-G formula based on SCr of 2.49 mg/dL (H)).    Allergies  Allergen Reactions   Enalapril Maleate Cough    Antimicrobials this admission: Vanco 7/1 >>  Cefepime 7/1 >>  Flagyl 7/1   Microbiology results: 7/1 BCx: pending 7/1 UCx: pending    Thank you for allowing pharmacy to be a part of this patient's care.  Ramond Craver 02/02/2021 3:00 PM

## 2021-02-03 LAB — GLUCOSE, CAPILLARY
Glucose-Capillary: 146 mg/dL — ABNORMAL HIGH (ref 70–99)
Glucose-Capillary: 147 mg/dL — ABNORMAL HIGH (ref 70–99)
Glucose-Capillary: 154 mg/dL — ABNORMAL HIGH (ref 70–99)
Glucose-Capillary: 115 mg/dL — ABNORMAL HIGH (ref 70–99)
Glucose-Capillary: 151 mg/dL — ABNORMAL HIGH (ref 70–99)
Glucose-Capillary: 246 mg/dL — ABNORMAL HIGH (ref 70–99)

## 2021-02-03 LAB — BASIC METABOLIC PANEL
Anion gap: 11 (ref 5–15)
BUN: 32 mg/dL — ABNORMAL HIGH (ref 8–23)
CO2: 26 mmol/L (ref 22–32)
Calcium: 6.7 mg/dL — ABNORMAL LOW (ref 8.9–10.3)
Chloride: 100 mmol/L (ref 98–111)
Creatinine, Ser: 2.65 mg/dL — ABNORMAL HIGH (ref 0.61–1.24)
GFR, Estimated: 24 mL/min — ABNORMAL LOW (ref >60)
Glucose, Bld: 153 mg/dL — ABNORMAL HIGH (ref 70–99)
Potassium: 3.1 mmol/L — ABNORMAL LOW (ref 3.5–5.1)
Sodium: 137 mmol/L (ref 135–145)

## 2021-02-03 LAB — CBC
HCT: 27.6 % — ABNORMAL LOW (ref 39.0–52.0)
Hemoglobin: 8.1 g/dL — ABNORMAL LOW (ref 13.0–17.0)
MCH: 29.7 pg (ref 26.0–34.0)
MCHC: 29.3 g/dL — ABNORMAL LOW (ref 30.0–36.0)
MCV: 101.1 fL — ABNORMAL HIGH (ref 80.0–100.0)
Platelets: 173 10*3/uL (ref 150–400)
RBC: 2.73 MIL/uL — ABNORMAL LOW (ref 4.22–5.81)
RDW: 16.5 % — ABNORMAL HIGH (ref 11.5–15.5)
WBC: 12.6 10*3/uL — ABNORMAL HIGH (ref 4.0–10.5)
nRBC: 0 % (ref 0.0–0.2)

## 2021-02-03 LAB — PROTIME-INR
INR: 1.7 — ABNORMAL HIGH (ref 0.8–1.2)
Prothrombin Time: 19.6 seconds — ABNORMAL HIGH (ref 11.4–15.2)

## 2021-02-03 LAB — MRSA NEXT GEN BY PCR, NASAL: MRSA by PCR Next Gen: NOT DETECTED

## 2021-02-03 LAB — PROCALCITONIN: Procalcitonin: 2.86 ng/mL

## 2021-02-03 LAB — CORTISOL-AM, BLOOD: Cortisol - AM: 21.1 ug/dL (ref 6.7–22.6)

## 2021-02-03 MED ORDER — CALCIUM ACETATE (PHOS BINDER) 667 MG PO CAPS
1334.0000 mg | ORAL_CAPSULE | Freq: Three times a day (TID) | ORAL | Status: DC
  Administered 2021-02-03 – 2021-02-06 (×10): 1334 mg via ORAL
  Filled 2021-02-03 (×10): qty 2

## 2021-02-03 MED ORDER — CHLORHEXIDINE GLUCONATE CLOTH 2 % EX PADS
6.0000 | MEDICATED_PAD | Freq: Every day | CUTANEOUS | Status: DC
  Administered 2021-02-03 – 2021-02-06 (×4): 6 via TOPICAL

## 2021-02-03 NOTE — Progress Notes (Signed)
   02/03/21 2011  Vitals  Temp 100.3 F (37.9 C)  Temp Source Oral  BP (!) 116/55  MAP (mmHg) 75  BP Location Left Arm  Patient Position (if appropriate) Lying  Pulse Rate (!) 109  Pulse Rate Source Monitor  Resp (!) 22  MEWS COLOR  MEWS Score Color Yellow  Oxygen Therapy  SpO2 98 %  O2 Device Nasal Cannula  MEWS Score  MEWS Temp 0  MEWS Systolic 0  MEWS Pulse 1  MEWS RR 1  MEWS LOC 0  MEWS Score 2

## 2021-02-03 NOTE — Progress Notes (Signed)
PROGRESS NOTE    Cliffton Spradley  DJT:[id number] DOB: [birth year] DOA: 02/02/2021 PCP: [name], MD   Brief Narrative:   [name] is a 80 y.o. male with a history of type 2 diabetes on insulin, hypertension, end-stage renal disease secondary to diabetic nephropathy, nonalcoholic fatty liver disease, CAD, grade 1 diastolic dysfunction.  He was brought to the ED with altered mentation that was noticed during dialysis.  He was also noted to be hypoglycemic and has a UTI.  Assessment & Plan:   Principal Problem:   Sepsis ([place]) Active Problems:   Essential hypertension, benign   DM type 2 with diabetic peripheral neuropathy (HCC)   Metabolic encephalopathy   ESRD (end stage renal disease) (HCC)   Hypoglycemia   Acute lower UTI   Sepsis secondary to UTI present on admission-resolving -Urine cultures currently pending -Plan to continue Rocephin -Fever has resolved -Okay to transfer to telemetry  Acute metabolic encephalopathy secondary to above-improving -Likely also multifactorial with hypoglycemia -Started renal/carb modified diet  Hypoglycemia in the setting of insulin-dependent diabetes -Appears not to be getting his insulin properly at SNF, uncertain what the problem is, but would likely require decreasing dosage upon discharge -Stop D5 IV fluid  Hypokalemia -Continue to monitor for now  History of hypertension -Currently stable, continue home medications  ESRD -Appreciate nephrology for hemodialysis likely with further session planned by 7/4  DVT prophylaxis: Heparin Code Status: Full Family Communication: Discussed with wife at bedside Disposition Plan:  Status is: Inpatient  Remains inpatient appropriate because:Altered mental status, IV treatments appropriate due to intensity of illness or inability to take PO, and Inpatient level of care appropriate due to severity of illness  Dispo: The patient is from: SNF              Anticipated  d/c is to: SNF              Patient currently is not medically stable to d/c.   Difficult to place patient No   Nutritional Assessment:  The patient's BMI is: Body mass index is 25.98 kg/m.Marland Kitchen  Seen by dietician.  I agree with the assessment and plan as outlined below:  Nutrition Status:    Skin Assessment:  I have examined the patient's skin and I agree with the wound assessment as performed by the wound care RN as outlined below:  Pressure Injury 01/04/21 Buttocks Left;Medial Stage 2 -  Partial thickness loss of dermis presenting as a shallow open injury with a red, pink wound bed without slough. 2cm x 1 cm, pink wound base, no drainage or odor (Active)  01/04/21 1115  Location: Buttocks  Location Orientation: Left;Medial  Staging: Stage 2 -  Partial thickness loss of dermis presenting as a shallow open injury with a red, pink wound bed without slough.  Wound Description (Comments): 2cm x 1 cm, pink wound base, no drainage or odor  Present on Admission:     Consultants:  Nephrology  Procedures:  See below  Antimicrobials:  Anti-infectives (From admission, onward)    Start     Dose/Rate Route Frequency Ordered Stop   02/04/21 1500  vancomycin (VANCOREADY) IVPB 1250 mg/250 mL  Status:  Discontinued        1,250 mg 166.7 mL/hr over 90 Minutes Intravenous Every 48 hours 02/02/21 1459 02/02/21 1631   02/02/21 2200  cefTRIAXone (ROCEPHIN) 2 g in sodium chloride 0.9 % 100 mL IVPB        2 g 200  mL/hr over 30 Minutes Intravenous Every 24 hours 02/02/21 1631     02/02/21 1445  vancomycin (VANCOREADY) IVPB 1750 mg/350 mL  Status:  Discontinued        1,750 mg 175 mL/hr over 120 Minutes Intravenous  Once 02/02/21 1428 02/02/21 1631   02/02/21 1430  ceFEPIme (MAXIPIME) 2 g in sodium chloride 0.9 % 100 mL IVPB  Status:  Discontinued        2 g 200 mL/hr over 30 Minutes Intravenous  Once 02/02/21 1418 02/02/21 1427   02/02/21 1430  metroNIDAZOLE (FLAGYL) IVPB 500 mg        500  mg 100 mL/hr over 60 Minutes Intravenous  Once 02/02/21 1418 02/02/21 1548   02/02/21 1430  vancomycin (VANCOCIN) IVPB 1000 mg/200 mL premix  Status:  Discontinued        1,000 mg 200 mL/hr over 60 Minutes Intravenous  Once 02/02/21 1418 02/02/21 1428   02/02/21 1430  ceFEPIme (MAXIPIME) 2 g in sodium chloride 0.9 % 100 mL IVPB  Status:  Discontinued        2 g 200 mL/hr over 30 Minutes Intravenous Every 24 hours 02/02/21 1427 02/02/21 1631       Subjective: Patient seen and evaluated today and appears to be having improving mentation.  Blood glucose levels appear to be stabilizing.  Objective: Vitals:   02/03/21 0900 02/03/21 1000 02/03/21 1100 02/03/21 1132  BP: 133/61 119/67 140/68   Pulse: 90 94 95 96  Resp: 17 (!) 24 (!) 36 12  Temp: 98.6 F (37 C) 98.6 F (37 C) 98.78 F (37.1 C) 98.1 F (36.7 C)  TempSrc:    Oral  SpO2: 100% 100% 100% 100%  Weight:      Height:        Intake/Output Summary (Last 24 hours) at 02/03/2021 1209 Last data filed at 02/03/2021 0300 Gross per 24 hour  Intake 1115.14 ml  Output 1750 ml  Net -634.86 ml   Filed Weights   02/02/21 1401 02/03/21 0400  Weight: 78.7 kg 77.5 kg    Examination:  General exam: Appears calm and comfortable, hard of hearing with hearing aids present Respiratory system: Clear to auscultation. Respiratory effort normal.  Currently on nasal cannula oxygen Cardiovascular system: S1 & S2 heard, RRR.  Gastrointestinal system: Abdomen is soft Central nervous system: Alert and awake Extremities: No edema Skin: No significant lesions noted Psychiatry: Flat affect. Foley with clear, yellow urine output    Data Reviewed: I have personally reviewed following labs and imaging studies  CBC: Recent Labs  Lab 02/02/21 1420 02/03/21 0513  WBC 17.4* 12.6*  NEUTROABS 16.2*  --   HGB 9.0* 8.1*  HCT 29.6* 27.6*  MCV 99.7 101.1*  PLT 204 132   Basic Metabolic Panel: Recent Labs  Lab 02/02/21 1420 02/03/21 0513   NA 136 137  K 3.1* 3.1*  CL 99 100  CO2 25 26  GLUCOSE 52* 153*  BUN 27* 32*  CREATININE 2.49* 2.65*  CALCIUM 6.8* 6.7*   GFR: Estimated Creatinine Clearance: 21.9 mL/min (A) (by C-G formula based on SCr of 2.65 mg/dL (H)). Liver Function Tests: Recent Labs  Lab 02/02/21 1420  AST 17  ALT 14  ALKPHOS 176*  BILITOT 0.5  PROT 6.2*  ALBUMIN 2.5*   No results for input(s): LIPASE, AMYLASE in the last 168 hours. No results for input(s): AMMONIA in the last 168 hours. Coagulation Profile: Recent Labs  Lab 02/02/21 1420 02/03/21 0513  INR 1.5* 1.7*  Cardiac Enzymes: No results for input(s): CKTOTAL, CKMB, CKMBINDEX, TROPONINI in the last 168 hours. BNP (last 3 results) No results for input(s): PROBNP in the last 8760 hours. HbA1C: No results for input(s): HGBA1C in the last 72 hours. CBG: Recent Labs  Lab 02/02/21 2212 02/03/21 0108 02/03/21 0423 02/03/21 0709 02/03/21 1132  GLUCAP 137* 146* 147* 154* 115*   Lipid Profile: No results for input(s): CHOL, HDL, LDLCALC, TRIG, CHOLHDL, LDLDIRECT in the last 72 hours. Thyroid Function Tests: No results for input(s): TSH, T4TOTAL, FREET4, T3FREE, THYROIDAB in the last 72 hours. Anemia Panel: No results for input(s): VITAMINB12, FOLATE, FERRITIN, TIBC, IRON, RETICCTPCT in the last 72 hours. Sepsis Labs: Recent Labs  Lab 02/02/21 1436 02/02/21 1617 02/02/21 1625 02/03/21 0513  PROCALCITON  --   --  1.72 2.86  LATICACIDVEN 1.6 1.1  --   --     Recent Results (from the past 240 hour(s))  MRSA Next Gen by PCR, Nasal     Status: None   Collection Time: 02/02/21  2:30 AM   Specimen: Nasal Mucosa; Nasal Swab  Result Value Ref Range Status   MRSA by PCR Next Gen NOT DETECTED NOT DETECTED Final    Comment: (NOTE) The GeneXpert MRSA Assay (FDA approved for NASAL specimens only), is one component of a comprehensive MRSA colonization surveillance program. It is not intended to diagnose MRSA infection nor to guide or  monitor treatment for MRSA infections. Test performance is not FDA approved in patients less than 85 years old. Performed at [facility], [address], [place], [place] [address]   Resp Panel by RT-PCR (Flu A&B, Covid) Urine, Catheterized     Status: None   Collection Time: 02/02/21  2:30 PM   Specimen: Urine, Catheterized; Nasopharyngeal(NP) swabs in vial transport medium  Result Value Ref Range Status   SARS Coronavirus 2 by RT PCR NEGATIVE NEGATIVE Final    Comment: (NOTE) SARS-CoV-2 target nucleic acids are NOT DETECTED.  The SARS-CoV-2 RNA is generally detectable in upper respiratory specimens during the acute phase of infection. The lowest concentration of SARS-CoV-2 viral copies this assay can detect is 138 copies/mL. A negative result does not preclude SARS-Cov-2 infection and should not be used as the sole basis for treatment or other patient management decisions. A negative result may occur with  improper specimen collection/handling, submission of specimen other than nasopharyngeal swab, presence of viral mutation(s) within the areas targeted by this assay, and inadequate number of viral copies(<138 copies/mL). A negative result must be combined with clinical observations, patient history, and epidemiological information. The expected result is Negative.  Fact Sheet for Patients:  EntrepreneurPulse.com.au  Fact Sheet for Healthcare Providers:  IncredibleEmployment.be  This test is no t yet approved or cleared by the [place] FDA and  has been authorized for detection and/or diagnosis of SARS-CoV-2 by FDA under an Emergency Use Authorization (EUA). This EUA will remain  in effect (meaning this test can be used) for the duration of the COVID-19 declaration under Section 564(b)(1) of the Act, 21 U.S.C.section 360bbb-3(b)(1), unless the authorization is terminated  or revoked sooner.       Influenza A by PCR NEGATIVE NEGATIVE  Final   Influenza B by PCR NEGATIVE NEGATIVE Final    Comment: (NOTE) The Xpert Xpress SARS-CoV-2/FLU/RSV plus assay is intended as an aid in the diagnosis of influenza from Nasopharyngeal swab specimens and should not be used as a sole basis for treatment. Nasal washings and aspirates are unacceptable for Xpert  Xpress SARS-CoV-2/FLU/RSV testing.  Fact Sheet for Patients: EntrepreneurPulse.com.au  Fact Sheet for Healthcare Providers: IncredibleEmployment.be  This test is not yet approved or cleared by the [place] FDA and has been authorized for detection and/or diagnosis of SARS-CoV-2 by FDA under an Emergency Use Authorization (EUA). This EUA will remain in effect (meaning this test can be used) for the duration of the COVID-19 declaration under Section 564(b)(1) of the Act, 21 U.S.C. section 360bbb-3(b)(1), unless the authorization is terminated or revoked.  Performed at [facility], [address], [place], [place] [address]   Blood Culture (routine x 2)     Status: None (Preliminary result)   Collection Time: 02/02/21  2:36 PM   Specimen: BLOOD  Result Value Ref Range Status   Specimen Description BLOOD LEFT ARM  Final   Special Requests   Final    BOTTLES DRAWN AEROBIC AND ANAEROBIC Blood Culture adequate volume   Culture   Final    NO GROWTH < 24 HOURS Performed at [facility], [address], [place], [place] [address]    Report Status PENDING  Incomplete  Blood Culture (routine x 2)     Status: None (Preliminary result)   Collection Time: 02/02/21  2:36 PM   Specimen: BLOOD  Result Value Ref Range Status   Specimen Description BLOOD RIGHT HAND  Final   Special Requests   Final    BOTTLES DRAWN AEROBIC AND ANAEROBIC Blood Culture results may not be optimal due to an inadequate volume of blood received in culture bottles   Culture   Final    NO GROWTH < 24 HOURS Performed at [facility], [address] [place],   [address]    Report Status PENDING  Incomplete         Radiology Studies: CT Head Wo Contrast  Result Date: 02/02/2021 CLINICAL DATA:  Altered mental status. EXAM: CT HEAD WITHOUT CONTRAST TECHNIQUE: Contiguous axial images were obtained from the base of the skull through the vertex without intravenous contrast. COMPARISON:  Head CT 12/23/2020 FINDINGS: Brain: Brain volume is normal for age. There is moderate chronic small vessel ischemia that is not significantly changed from prior. No intracranial hemorrhage, mass effect, or midline shift. No hydrocephalus. The basilar cisterns are patent. No evidence of territorial infarct or acute ischemia. No extra-axial or intracranial fluid collection. Vascular: No hyperdense vessel. There is skull base atherosclerosis. Skull: No fracture or focal lesion. Sinuses/Orbits: Mucous retention cyst in the right maxillary sinus. Bilateral cataract resection. Other: Prominent subcutaneous calcifications typical of diabetes. IMPRESSION: 1. No acute intracranial abnormality. 2. Unchanged chronic small vessel ischemia. Electronically Signed   By: Keith Rake M.D.   On: 02/02/2021 16:02   DG Chest Port 1 View  Result Date: 02/02/2021 CLINICAL DATA:  Altered mental status.  Possible sepsis. EXAM: PORTABLE CHEST 1 VIEW COMPARISON:  01/22/2021 and prior radiographs FINDINGS: This is a low volume study with mild bibasilar atelectasis. Cardiomediastinal silhouette is unchanged. A RIGHT IJ central venous catheter with tip overlying the SUPERIOR cavoatrial junction noted. No pleural effusion or pneumothorax noted. IMPRESSION: Low volume study with mild bibasilar atelectasis. Electronically Signed   By: Margarette Canada M.D.   On: 02/02/2021 15:17        Scheduled Meds:  aspirin EC  81 mg Oral QHS   calcitRIOL  0.25 mcg Oral Daily   Chlorhexidine Gluconate Cloth  6 each Topical Daily   diltiazem  180 mg Oral Daily   heparin  5,000 Units Subcutaneous Q8H  lipase/protease/amylase  12,000 Units Oral TID AC   metoprolol tartrate  50 mg Oral BID   pravastatin  10 mg Oral q1800   saccharomyces boulardii  250 mg Oral BID   Continuous Infusions:  cefTRIAXone (ROCEPHIN)  IV 2 g (02/02/21 2229)     LOS: 1 day    Time spent: 35 minutes    Olamide Lahaie Darleen Crocker, DO [facility]  If 7PM-7AM, please contact night-coverage [email or web address] 02/03/2021, 12:09 PM

## 2021-02-03 NOTE — Progress Notes (Signed)
   02/03/21 2300  Vitals  Temp 100.2 F (37.9 C)  BP (!) 116/55  Patient Position (if appropriate) Lying  Pulse Rate 97  Pulse Rate Source Monitor  Resp (!) 21  MEWS COLOR  MEWS Score Color Green  Oxygen Therapy  SpO2 98 %  O2 Device Room Air  MEWS Score  MEWS Temp 0  MEWS Systolic 0  MEWS Pulse 0  MEWS RR 1  MEWS LOC 0  MEWS Score 1

## 2021-02-03 NOTE — Progress Notes (Addendum)
[facility] NEPHROLOGY PROGRESS NOTE  Assessment/ Plan: Pt is a 80 y.o. yo male with history of hypertension, nonalcoholic fatty liver, CAD, diastolic CHF, AKI on CKD dialysis dependent after recent prolonged hospitalization last month with COVID infection, now presented with altered mental status in the setting of hypoglycemia and sepsis, seen as a consultation for dialysis and related management.  OP HD orders: RKC, 3.5 hr, 3K, 2.5 ca, 400/800, RIJ TDC, EDW 81 kg,    #Sepsis due to urinary tract infection: Follow-up culture results.  Currently on Rocephin.  #Acute metabolic encephalopathy probably because of hypoglycemia: Discontinued IV fluid.  Now he is eating well with acceptable blood sugar level.  Management per primary team.  It seems like his mental status has improved to his baseline.  #AKI on CKD, dialysis dependent at [facility]: On dialysis after recent hospitalization.  He had about 43 minutes of dialysis yesterday when he was found to be altered and noted blood sugar level of 38.  He has Foley catheter with urinary bag has around 2 L of urine.  On antibiotics for UTI as above.  There is no need for dialysis at this time.  Fortunately he still has good residual kidney function.  Plan for next dialysis on Monday, it can be done outpatient if he is discharged before then.  # Anemia: Hemoglobin below goal.  He had Mircera 150 mcg and Venofer on 6/27.  Monitor hemoglobin.  # Secondary hyperparathyroidism: Check phosphorus level.  Resume PhosLo as he started eating.  Continue calcitriol.  # HTN/volume: Blood pressure acceptable.  Volume status acceptable.  #Hypokalemia: Started eating.  Monitor lab.  Managed with dialysis.  Thank you for the consult.  Please call back with question.  Subjective: Patient was seen and examined.  He was eating his meal.  He reports hungry.  He was alert awake and oriented.  He is hard of hearing.  His wife at bedside.  He  has indwelling Foley catheter and the bag has around total 2 L of urine. Objective Vital signs in last 24 hours: Vitals:   02/03/21 0900 02/03/21 1000 02/03/21 1100 02/03/21 1132  BP: 133/61 119/67 140/68   Pulse: 90 94 95 96  Resp: 17 (!) 24 (!) 36 12  Temp: 98.6 F (37 C) 98.6 F (37 C) 98.78 F (37.1 C) 98.1 F (36.7 C)  TempSrc:    Oral  SpO2: 100% 100% 100% 100%  Weight:      Height:       Weight change:   Intake/Output Summary (Last 24 hours) at 02/03/2021 1214 Last data filed at 02/03/2021 0300 Gross per 24 hour  Intake 1115.14 ml  Output 1750 ml  Net -634.86 ml       Labs: Basic Metabolic Panel: Recent Labs  Lab 02/02/21 1420 02/03/21 0513  NA 136 137  K 3.1* 3.1*  CL 99 100  CO2 25 26  GLUCOSE 52* 153*  BUN 27* 32*  CREATININE 2.49* 2.65*  CALCIUM 6.8* 6.7*   Liver Function Tests: Recent Labs  Lab 02/02/21 1420  AST 17  ALT 14  ALKPHOS 176*  BILITOT 0.5  PROT 6.2*  ALBUMIN 2.5*   No results for input(s): LIPASE, AMYLASE in the last 168 hours. No results for input(s): AMMONIA in the last 168 hours. CBC: Recent Labs  Lab 02/02/21 1420 02/03/21 0513  WBC 17.4* 12.6*  NEUTROABS 16.2*  --   HGB 9.0* 8.1*  HCT 29.6* 27.6*  MCV 99.7 101.1*  PLT 204 173   Cardiac Enzymes: No results for input(s): CKTOTAL, CKMB, CKMBINDEX, TROPONINI in the last 168 hours. CBG: Recent Labs  Lab 02/02/21 2212 02/03/21 0108 02/03/21 0423 02/03/21 0709 02/03/21 1132  GLUCAP 137* 146* 147* 154* 115*    Iron Studies: No results for input(s): IRON, TIBC, TRANSFERRIN, FERRITIN in the last 72 hours. Studies/Results: CT Head Wo Contrast  Result Date: 02/02/2021 CLINICAL DATA:  Altered mental status. EXAM: CT HEAD WITHOUT CONTRAST TECHNIQUE: Contiguous axial images were obtained from the base of the skull through the vertex without intravenous contrast. COMPARISON:  Head CT 12/23/2020 FINDINGS: Brain: Brain volume is normal for age. There is moderate chronic  small vessel ischemia that is not significantly changed from prior. No intracranial hemorrhage, mass effect, or midline shift. No hydrocephalus. The basilar cisterns are patent. No evidence of territorial infarct or acute ischemia. No extra-axial or intracranial fluid collection. Vascular: No hyperdense vessel. There is skull base atherosclerosis. Skull: No fracture or focal lesion. Sinuses/Orbits: Mucous retention cyst in the right maxillary sinus. Bilateral cataract resection. Other: Prominent subcutaneous calcifications typical of diabetes. IMPRESSION: 1. No acute intracranial abnormality. 2. Unchanged chronic small vessel ischemia. Electronically Signed   By: Keith Rake M.D.   On: 02/02/2021 16:02   DG Chest Port 1 View  Result Date: 02/02/2021 CLINICAL DATA:  Altered mental status.  Possible sepsis. EXAM: PORTABLE CHEST 1 VIEW COMPARISON:  01/22/2021 and prior radiographs FINDINGS: This is a low volume study with mild bibasilar atelectasis. Cardiomediastinal silhouette is unchanged. A RIGHT IJ central venous catheter with tip overlying the SUPERIOR cavoatrial junction noted. No pleural effusion or pneumothorax noted. IMPRESSION: Low volume study with mild bibasilar atelectasis. Electronically Signed   By: Margarette Canada M.D.   On: 02/02/2021 15:17    Medications: Infusions:  cefTRIAXone (ROCEPHIN)  IV 2 g (02/02/21 2229)    Scheduled Medications:  aspirin EC  81 mg Oral QHS   calcitRIOL  0.25 mcg Oral Daily   Chlorhexidine Gluconate Cloth  6 each Topical Daily   diltiazem  180 mg Oral Daily   heparin  5,000 Units Subcutaneous Q8H   lipase/protease/amylase  12,000 Units Oral TID AC   metoprolol tartrate  50 mg Oral BID   pravastatin  10 mg Oral q1800   saccharomyces boulardii  250 mg Oral BID    have reviewed scheduled and prn medications.  Physical Exam: General:NAD, comfortable, eating male. Heart:RRR, s1s2 nl Lungs:clear b/l, no crackle Abdomen:soft, Non-tender,  non-distended Extremities:No edema Dialysis Access: Right IJ [facility] Neurology: Alert, awake and following commands.  Navjot Pilgrim Tanna Furry 02/03/2021,12:14 PM  LOS: 1 day

## 2021-02-04 LAB — CBC
HCT: 25.7 % — ABNORMAL LOW (ref 39.0–52.0)
Hemoglobin: 7.5 g/dL — ABNORMAL LOW (ref 13.0–17.0)
MCH: 29.5 pg (ref 26.0–34.0)
MCHC: 29.2 g/dL — ABNORMAL LOW (ref 30.0–36.0)
MCV: 101.2 fL — ABNORMAL HIGH (ref 80.0–100.0)
Platelets: 171 10*3/uL (ref 150–400)
RBC: 2.54 MIL/uL — ABNORMAL LOW (ref 4.22–5.81)
RDW: 16.6 % — ABNORMAL HIGH (ref 11.5–15.5)
WBC: 13.3 10*3/uL — ABNORMAL HIGH (ref 4.0–10.5)
nRBC: 0 % (ref 0.0–0.2)

## 2021-02-04 LAB — BASIC METABOLIC PANEL
Anion gap: 11 (ref 5–15)
BUN: 39 mg/dL — ABNORMAL HIGH (ref 8–23)
CO2: 23 mmol/L (ref 22–32)
Calcium: 6.6 mg/dL — ABNORMAL LOW (ref 8.9–10.3)
Chloride: 102 mmol/L (ref 98–111)
Creatinine, Ser: 2.85 mg/dL — ABNORMAL HIGH (ref 0.61–1.24)
GFR, Estimated: 22 mL/min — ABNORMAL LOW (ref >60)
Glucose, Bld: 188 mg/dL — ABNORMAL HIGH (ref 70–99)
Potassium: 3 mmol/L — ABNORMAL LOW (ref 3.5–5.1)
Sodium: 136 mmol/L (ref 135–145)

## 2021-02-04 LAB — MAGNESIUM: Magnesium: 1.2 mg/dL — ABNORMAL LOW (ref 1.7–2.4)

## 2021-02-04 LAB — GLUCOSE, CAPILLARY
Glucose-Capillary: 169 mg/dL — ABNORMAL HIGH (ref 70–99)
Glucose-Capillary: 262 mg/dL — ABNORMAL HIGH (ref 70–99)
Glucose-Capillary: 232 mg/dL — ABNORMAL HIGH (ref 70–99)
Glucose-Capillary: 148 mg/dL — ABNORMAL HIGH (ref 70–99)

## 2021-02-04 LAB — PROCALCITONIN: Procalcitonin: 2.63 ng/mL

## 2021-02-04 MED ORDER — INSULIN GLARGINE 100 UNIT/ML ~~LOC~~ SOLN
4.0000 [IU] | Freq: Every day | SUBCUTANEOUS | Status: DC
  Filled 2021-02-04 (×3): qty 0.04

## 2021-02-04 MED ORDER — MAGNESIUM SULFATE 2 GM/50ML IV SOLN
2.0000 g | Freq: Once | INTRAVENOUS | Status: AC
  Administered 2021-02-04: 2 g via INTRAVENOUS
  Filled 2021-02-04: qty 50

## 2021-02-04 MED ORDER — INSULIN ASPART 100 UNIT/ML IJ SOLN
8.0000 [IU] | Freq: Three times a day (TID) | INTRAMUSCULAR | Status: DC
  Administered 2021-02-04 – 2021-02-06 (×5): 8 [IU] via SUBCUTANEOUS

## 2021-02-04 MED ORDER — INSULIN ASPART 100 UNIT/ML IJ SOLN
0.0000 [IU] | Freq: Three times a day (TID) | INTRAMUSCULAR | Status: DC
  Administered 2021-02-04: 2 [IU] via SUBCUTANEOUS
  Administered 2021-02-05: 3 [IU] via SUBCUTANEOUS
  Administered 2021-02-06: 2 [IU] via SUBCUTANEOUS

## 2021-02-04 MED ORDER — POTASSIUM CHLORIDE CRYS ER 20 MEQ PO TBCR
40.0000 meq | EXTENDED_RELEASE_TABLET | Freq: Once | ORAL | Status: AC
  Administered 2021-02-04: 40 meq via ORAL
  Filled 2021-02-04: qty 2

## 2021-02-04 MED ORDER — INSULIN ASPART 100 UNIT/ML IJ SOLN: 0.0000 [IU] | Freq: Every day | INTRAMUSCULAR | Status: DC

## 2021-02-04 NOTE — Progress Notes (Signed)
PROGRESS NOTE    [name]  SJG:[id number] DOB: 1941/03/05 DOA: 02/02/2021 PCP: [name], MD   Brief Narrative:   Mark Jumbo Sr. is a 80 y.o. male with a history of type 2 diabetes on insulin, hypertension, end-stage renal disease secondary to diabetic nephropathy, nonalcoholic fatty liver disease, CAD, grade 1 diastolic dysfunction.  He was brought to the ED with altered mentation that was noticed during dialysis.  He was also noted to be hypoglycemic and has a UTI.  Urine culture is currently pending.  Assessment & Plan:   Principal Problem:   Sepsis ([place]) Active Problems:   Essential hypertension, benign   DM type 2 with diabetic peripheral neuropathy (HCC)   Metabolic encephalopathy   ESRD (end stage renal disease) ([place])   Hypoglycemia   Acute lower UTI   Sepsis secondary to UTI present on admission-resolving -Urine cultures currently pending -Plan to continue Rocephin -Fever has resolved   Acute metabolic encephalopathy secondary to above-improving -Likely also multifactorial with hypoglycemia -Started renal/carb modified diet   Hypoglycemia in the setting of insulin-dependent diabetes -Appears not to be getting his insulin properly at SNF, uncertain what the problem is, but would likely require decreasing dosage upon discharge -Now eating -Starting to become hyperglycemic and will resume some home insulin   Hypokalemia/hypomagnesemia -Replete -Recheck in a.m.   History of hypertension -Currently stable, continue home medications   ESRD -Appreciate nephrology for hemodialysis likely with further session planned by 7/4   DVT prophylaxis: Heparin Code Status: Full Family Communication: Discussed with wife at bedside 7/2 Disposition Plan:  Status is: Inpatient   Remains inpatient appropriate because:Altered mental status, IV treatments appropriate due to intensity of illness or inability to take PO, and Inpatient level of care  appropriate due to severity of illness   Dispo: The patient is from: SNF              Anticipated d/c is to: SNF              Patient currently is not medically stable to d/c.              Difficult to place patient No     Nutritional Assessment:   The patient's BMI is: Body mass index is 25.98 kg/m.Marland Kitchen   Seen by dietician.  I agree with the assessment and plan as outlined below:   Nutrition Status:  Skin Assessment:   I have examined the patient's skin and I agree with the wound assessment as performed by the wound care RN as outlined below:   Pressure Injury 01/04/21 Buttocks Left;Medial Stage 2 -  Partial thickness loss of dermis presenting as a shallow open injury with a red, pink wound bed without slough. 2cm x 1 cm, pink wound base, no drainage or odor (Active)  01/04/21 1115  Location: Buttocks  Location Orientation: Left;Medial  Staging: Stage 2 -  Partial thickness loss of dermis presenting as a shallow open injury with a red, pink wound bed without slough.  Wound Description (Comments): 2cm x 1 cm, pink wound base, no drainage or odor  Present on Admission:       Consultants:  Nephrology   Procedures:  See below  Antimicrobials:  Anti-infectives (From admission, onward)    Start     Dose/Rate Route Frequency Ordered Stop   02/04/21 1500  vancomycin (VANCOREADY) IVPB 1250 mg/250 mL  Status:  Discontinued        1,250 mg 166.7 mL/hr over 90  Minutes Intravenous Every 48 hours 02/02/21 1459 02/02/21 1631   02/02/21 2200  cefTRIAXone (ROCEPHIN) 2 g in sodium chloride 0.9 % 100 mL IVPB        2 g 200 mL/hr over 30 Minutes Intravenous Every 24 hours 02/02/21 1631     02/02/21 1445  vancomycin (VANCOREADY) IVPB 1750 mg/350 mL  Status:  Discontinued        1,750 mg 175 mL/hr over 120 Minutes Intravenous  Once 02/02/21 1428 02/02/21 1631   02/02/21 1430  ceFEPIme (MAXIPIME) 2 g in sodium chloride 0.9 % 100 mL IVPB  Status:  Discontinued        2 g 200 mL/hr over 30  Minutes Intravenous  Once 02/02/21 1418 02/02/21 1427   02/02/21 1430  metroNIDAZOLE (FLAGYL) IVPB 500 mg        500 mg 100 mL/hr over 60 Minutes Intravenous  Once 02/02/21 1418 02/02/21 1548   02/02/21 1430  vancomycin (VANCOCIN) IVPB 1000 mg/200 mL premix  Status:  Discontinued        1,000 mg 200 mL/hr over 60 Minutes Intravenous  Once 02/02/21 1418 02/02/21 1428   02/02/21 1430  ceFEPIme (MAXIPIME) 2 g in sodium chloride 0.9 % 100 mL IVPB  Status:  Discontinued        2 g 200 mL/hr over 30 Minutes Intravenous Every 24 hours 02/02/21 1427 02/02/21 1631       Subjective: Patient seen and evaluated today with no new acute complaints or concerns. No acute concerns or events noted overnight.  Objective: Vitals:   02/03/21 2011 02/03/21 2300 02/04/21 0454 02/04/21 0842  BP: (!) 116/55 (!) 116/55 (!) 94/55 (!) 102/42  Pulse: (!) 109 97 77 100  Resp: (!) 22 (!) 21 20   Temp: 100.3 F (37.9 C) 100.2 F (37.9 C) 99.2 F (37.3 C)   TempSrc: Oral  Oral   SpO2: 98% 98% 99%   Weight:      Height:        Intake/Output Summary (Last 24 hours) at 02/04/2021 1141 Last data filed at 02/04/2021 0506 Gross per 24 hour  Intake 480 ml  Output 2675 ml  Net -2195 ml   Filed Weights   02/02/21 1401 02/03/21 0400  Weight: 78.7 kg 77.5 kg    Examination:  General exam: Appears calm and comfortable  Respiratory system: Clear to auscultation. Respiratory effort normal. Cardiovascular system: S1 & S2 heard, RRR.  Gastrointestinal system: Abdomen is soft Central nervous system: Alert and awake Extremities: No edema Skin: No significant lesions noted Psychiatry: Flat affect.    Data Reviewed: I have personally reviewed following labs and imaging studies  CBC: Recent Labs  Lab 02/02/21 1420 02/03/21 0513 02/04/21 0433  WBC 17.4* 12.6* 13.3*  NEUTROABS 16.2*  --   --   HGB 9.0* 8.1* 7.5*  HCT 29.6* 27.6* 25.7*  MCV 99.7 101.1* 101.2*  PLT 204 173 675   Basic Metabolic  Panel: Recent Labs  Lab 02/02/21 1420 02/03/21 0513 02/04/21 0433  NA 136 137 136  K 3.1* 3.1* 3.0*  CL 99 100 102  CO2 25 26 23   GLUCOSE 52* 153* 188*  BUN 27* 32* 39*  CREATININE 2.49* 2.65* 2.85*  CALCIUM 6.8* 6.7* 6.6*  MG  --   --  1.2*   GFR: Estimated Creatinine Clearance: 20.3 mL/min (A) (by C-G formula based on SCr of 2.85 mg/dL (H)). Liver Function Tests: Recent Labs  Lab 02/02/21 1420  AST 17  ALT 14  ALKPHOS 176*  BILITOT 0.5  PROT 6.2*  ALBUMIN 2.5*   No results for input(s): LIPASE, AMYLASE in the last 168 hours. No results for input(s): AMMONIA in the last 168 hours. Coagulation Profile: Recent Labs  Lab 02/02/21 1420 02/03/21 0513  INR 1.5* 1.7*   Cardiac Enzymes: No results for input(s): CKTOTAL, CKMB, CKMBINDEX, TROPONINI in the last 168 hours. BNP (last 3 results) No results for input(s): PROBNP in the last 8760 hours. HbA1C: No results for input(s): HGBA1C in the last 72 hours. CBG: Recent Labs  Lab 02/03/21 1132 02/03/21 1456 02/03/21 2130 02/04/21 0720 02/04/21 1119  GLUCAP 115* 151* 246* 169* 262*   Lipid Profile: No results for input(s): CHOL, HDL, LDLCALC, TRIG, CHOLHDL, LDLDIRECT in the last 72 hours. Thyroid Function Tests: No results for input(s): TSH, T4TOTAL, FREET4, T3FREE, THYROIDAB in the last 72 hours. Anemia Panel: No results for input(s): VITAMINB12, FOLATE, FERRITIN, TIBC, IRON, RETICCTPCT in the last 72 hours. Sepsis Labs: Recent Labs  Lab 02/02/21 1436 02/02/21 1617 02/02/21 1625 02/03/21 0513 02/04/21 0433  PROCALCITON  --   --  1.72 2.86 2.63  LATICACIDVEN 1.6 1.1  --   --   --     Recent Results (from the past 240 hour(s))  MRSA Next Gen by PCR, Nasal     Status: None   Collection Time: 02/02/21  2:30 AM   Specimen: Nasal Mucosa; Nasal Swab  Result Value Ref Range Status   MRSA by PCR Next Gen NOT DETECTED NOT DETECTED Final    Comment: (NOTE) The GeneXpert MRSA Assay (FDA approved for NASAL  specimens only), is one component of a comprehensive MRSA colonization surveillance program. It is not intended to diagnose MRSA infection nor to guide or monitor treatment for MRSA infections. Test performance is not FDA approved in patients less than 73 years old. Performed at [facility], [address] [place], [place] [address]   Resp Panel by RT-PCR (Flu A&B, Covid) Urine, Catheterized     Status: None   Collection Time: 02/02/21  2:30 PM   Specimen: Urine, Catheterized; Nasopharyngeal(NP) swabs in vial transport medium  Result Value Ref Range Status   SARS Coronavirus 2 by RT PCR NEGATIVE NEGATIVE Final    Comment: (NOTE) SARS-CoV-2 target nucleic acids are NOT DETECTED.  The SARS-CoV-2 RNA is generally detectable in upper respiratory specimens during the acute phase of infection. The lowest concentration of SARS-CoV-2 viral copies this assay can detect is 138 copies/mL. A negative result does not preclude SARS-Cov-2 infection and should not be used as the sole basis for treatment or other patient management decisions. A negative result may occur with  improper specimen collection/handling, submission of specimen other than nasopharyngeal swab, presence of viral mutation(s) within the areas targeted by this assay, and inadequate number of viral copies(<138 copies/mL). A negative result must be combined with clinical observations, patient history, and epidemiological information. The expected result is Negative.  Fact Sheet for Patients:  EntrepreneurPulse.com.au  Fact Sheet for Healthcare Providers:  IncredibleEmployment.be  This test is no t yet approved or cleared by the [place] FDA and  has been authorized for detection and/or diagnosis of SARS-CoV-2 by FDA under an Emergency Use Authorization (EUA). This EUA will remain  in effect (meaning this test can be used) for the duration of the COVID-19 declaration under Section  564(b)(1) of the Act, 21 U.S.C.section 360bbb-3(b)(1), unless the authorization is terminated  or revoked sooner.       Influenza A by PCR  NEGATIVE NEGATIVE Final   Influenza B by PCR NEGATIVE NEGATIVE Final    Comment: (NOTE) The Xpert Xpress SARS-CoV-2/FLU/RSV plus assay is intended as an aid in the diagnosis of influenza from Nasopharyngeal swab specimens and should not be used as a sole basis for treatment. Nasal washings and aspirates are unacceptable for Xpert Xpress SARS-CoV-2/FLU/RSV testing.  Fact Sheet for Patients: EntrepreneurPulse.com.au  Fact Sheet for Healthcare Providers: IncredibleEmployment.be  This test is not yet approved or cleared by the [place] FDA and has been authorized for detection and/or diagnosis of SARS-CoV-2 by FDA under an Emergency Use Authorization (EUA). This EUA will remain in effect (meaning this test can be used) for the duration of the COVID-19 declaration under Section 564(b)(1) of the Act, 21 U.S.C. section 360bbb-3(b)(1), unless the authorization is terminated or revoked.  Performed at [facility], [address], [place], [place] [address]   Blood Culture (routine x 2)     Status: None (Preliminary result)   Collection Time: 02/02/21  2:36 PM   Specimen: BLOOD  Result Value Ref Range Status   Specimen Description BLOOD LEFT ARM  Final   Special Requests   Final    BOTTLES DRAWN AEROBIC AND ANAEROBIC Blood Culture adequate volume   Culture   Final    NO GROWTH < 24 HOURS Performed at [facility], [address], [place], [place] [address]    Report Status PENDING  Incomplete  Blood Culture (routine x 2)     Status: None (Preliminary result)   Collection Time: 02/02/21  2:36 PM   Specimen: BLOOD  Result Value Ref Range Status   Specimen Description BLOOD RIGHT HAND  Final   Special Requests   Final    BOTTLES DRAWN AEROBIC AND ANAEROBIC Blood Culture results may not be optimal due to an  inadequate volume of blood received in culture bottles   Culture   Final    NO GROWTH < 24 HOURS Performed at [facility], [address] [place], [place] [address]    Report Status PENDING  Incomplete         Radiology Studies: CT Head Wo Contrast  Result Date: 02/02/2021 CLINICAL DATA:  Altered mental status. EXAM: CT HEAD WITHOUT CONTRAST TECHNIQUE: Contiguous axial images were obtained from the base of the skull through the vertex without intravenous contrast. COMPARISON:  Head CT 12/23/2020 FINDINGS: Brain: Brain volume is normal for age. There is moderate chronic small vessel ischemia that is not significantly changed from prior. No intracranial hemorrhage, mass effect, or midline shift. No hydrocephalus. The basilar cisterns are patent. No evidence of territorial infarct or acute ischemia. No extra-axial or intracranial fluid collection. Vascular: No hyperdense vessel. There is skull base atherosclerosis. Skull: No fracture or focal lesion. Sinuses/Orbits: Mucous retention cyst in the right maxillary sinus. Bilateral cataract resection. Other: Prominent subcutaneous calcifications typical of diabetes. IMPRESSION: 1. No acute intracranial abnormality. 2. Unchanged chronic small vessel ischemia. Electronically Signed   By: Keith Rake M.D.   On: 02/02/2021 16:02   DG Chest Port 1 View  Result Date: 02/02/2021 CLINICAL DATA:  Altered mental status.  Possible sepsis. EXAM: PORTABLE CHEST 1 VIEW COMPARISON:  01/22/2021 and prior radiographs FINDINGS: This is a low volume study with mild bibasilar atelectasis. Cardiomediastinal silhouette is unchanged. A RIGHT IJ central venous catheter with tip overlying the SUPERIOR cavoatrial junction noted. No pleural effusion or pneumothorax noted. IMPRESSION: Low volume study with mild bibasilar atelectasis. Electronically Signed   By: Cleatis Polka.D.  On: 02/02/2021 15:17        Scheduled Meds:  aspirin EC  81 mg Oral QHS   calcitRIOL  0.25  mcg Oral Daily   calcium acetate  1,334 mg Oral TID WC   Chlorhexidine Gluconate Cloth  6 each Topical Daily   diltiazem  180 mg Oral Daily   heparin  5,000 Units Subcutaneous Q8H   lipase/protease/amylase  12,000 Units Oral TID AC   metoprolol tartrate  50 mg Oral BID   pravastatin  10 mg Oral q1800   saccharomyces boulardii  250 mg Oral BID   Continuous Infusions:  cefTRIAXone (ROCEPHIN)  IV 2 g (02/03/21 2135)     LOS: 2 days    Time spent: 35 minutes    Justin Buechner Darleen Crocker, DO [facility]  If 7PM-7AM, please contact night-coverage [email or web address] 02/04/2021, 11:41 AM

## 2021-02-05 LAB — RENAL FUNCTION PANEL
Albumin: 1.8 g/dL — ABNORMAL LOW (ref 3.5–5.0)
Anion gap: 12 (ref 5–15)
BUN: 48 mg/dL — ABNORMAL HIGH (ref 8–23)
CO2: 21 mmol/L — ABNORMAL LOW (ref 22–32)
Calcium: 6.8 mg/dL — ABNORMAL LOW (ref 8.9–10.3)
Chloride: 102 mmol/L (ref 98–111)
Creatinine, Ser: 2.96 mg/dL — ABNORMAL HIGH (ref 0.61–1.24)
GFR, Estimated: 21 mL/min — ABNORMAL LOW (ref >60)
Glucose, Bld: 130 mg/dL — ABNORMAL HIGH (ref 70–99)
Phosphorus: 4 mg/dL (ref 2.5–4.6)
Potassium: 3 mmol/L — ABNORMAL LOW (ref 3.5–5.1)
Sodium: 135 mmol/L (ref 135–145)

## 2021-02-05 LAB — CBC WITH DIFFERENTIAL/PLATELET
Abs Immature Granulocytes: 0.06 10*3/uL (ref 0.00–0.07)
Basophils Absolute: 0 10*3/uL (ref 0.0–0.1)
Basophils Relative: 0 %
Eosinophils Absolute: 0.2 10*3/uL (ref 0.0–0.5)
Eosinophils Relative: 2 %
HCT: 27.4 % — ABNORMAL LOW (ref 39.0–52.0)
Hemoglobin: 8.1 g/dL — ABNORMAL LOW (ref 13.0–17.0)
Immature Granulocytes: 1 %
Lymphocytes Relative: 9 %
Lymphs Abs: 0.9 10*3/uL (ref 0.7–4.0)
MCH: 29.7 pg (ref 26.0–34.0)
MCHC: 29.6 g/dL — ABNORMAL LOW (ref 30.0–36.0)
MCV: 100.4 fL — ABNORMAL HIGH (ref 80.0–100.0)
Monocytes Absolute: 0.6 10*3/uL (ref 0.1–1.0)
Monocytes Relative: 6 %
Neutro Abs: 7.6 10*3/uL (ref 1.7–7.7)
Neutrophils Relative %: 82 %
Platelets: 194 10*3/uL (ref 150–400)
RBC: 2.73 MIL/uL — ABNORMAL LOW (ref 4.22–5.81)
RDW: 16.5 % — ABNORMAL HIGH (ref 11.5–15.5)
WBC: 9.3 10*3/uL (ref 4.0–10.5)
nRBC: 0 % (ref 0.0–0.2)

## 2021-02-05 LAB — MAGNESIUM: Magnesium: 1.5 mg/dL — ABNORMAL LOW (ref 1.7–2.4)

## 2021-02-05 LAB — URINE CULTURE: Culture: >=100000 — AB

## 2021-02-05 LAB — GLUCOSE, CAPILLARY
Glucose-Capillary: 133 mg/dL — ABNORMAL HIGH (ref 70–99)
Glucose-Capillary: 282 mg/dL — ABNORMAL HIGH (ref 70–99)
Glucose-Capillary: 140 mg/dL — ABNORMAL HIGH (ref 70–99)
Glucose-Capillary: 67 mg/dL — ABNORMAL LOW (ref 70–99)
Glucose-Capillary: 120 mg/dL — ABNORMAL HIGH (ref 70–99)

## 2021-02-05 LAB — CREATININE, SERUM
Creatinine, Ser: 3.1 mg/dL — ABNORMAL HIGH (ref 0.61–1.24)
GFR, Estimated: 20 mL/min — ABNORMAL LOW (ref >60)

## 2021-02-05 MED ORDER — POTASSIUM CHLORIDE CRYS ER 20 MEQ PO TBCR
40.0000 meq | EXTENDED_RELEASE_TABLET | Freq: Once | ORAL | Status: AC
  Administered 2021-02-05: 40 meq via ORAL
  Filled 2021-02-05: qty 2

## 2021-02-05 MED ORDER — MAGNESIUM SULFATE 2 GM/50ML IV SOLN
2.0000 g | Freq: Once | INTRAVENOUS | Status: AC
  Administered 2021-02-05: 2 g via INTRAVENOUS
  Filled 2021-02-05: qty 50

## 2021-02-05 NOTE — Progress Notes (Addendum)
[facility] NEPHROLOGY PROGRESS NOTE  Assessment/ Plan: Pt is a 80 y.o. yo male with history of hypertension, nonalcoholic fatty liver, CAD, diastolic CHF, AKI on CKD dialysis dependent after recent prolonged hospitalization last month with COVID infection, now presented with altered mental status in the setting of hypoglycemia and sepsis, seen as a consultation for dialysis and related management.  OP HD orders: RKC, 3.5 hr, 3K, 2.5 ca, 400/800, RIJ TDC, EDW 81 kg,    #Sepsis due to urinary tract infection: Follow-up culture results.  Currently on Rocephin.  #Acute metabolic encephalopathy probably because of hypoglycemia: Blood sugar level and mental status has much improved.  He is now able to eat.  Insulin adjustment per primary team.  #AKI on CKD, dialysis dependent at [facility] kidney center: On dialysis after recent hospitalization.  He has increased urine output and creatinine level downtrending without needing dialysis.  It seems like he is having renal recovery.  His volume status is acceptable and has no features of uremia.  His mental status is much better.  No need for dialysis today and continue to watch for renal recovery.  We will repeat lab in the morning, strict ins and out.  Discussed with the patient and primary team.  # Anemia: Hemoglobin below goal.  He had Mircera 150 mcg and Venofer on 6/27.  Monitor hemoglobin.  # Secondary hyperparathyroidism: Phosphorus level acceptable, continue PhosLo and calcitriol.  Monitor lab.    # HTN/volume: Blood pressure acceptable.  Volume status acceptable.  #Hypokalemia: Started eating.  Replete oral KCl.   Subjective: Patient was seen and examined.  Urine output around 2.5 L in last 2 days.  He reports feeling good and eating well.  He denies headache, dizziness, nausea, vomiting, chest pain, shortness of breath.  He is happy to hear that he is kidney function is gradually improving. Objective Vital signs in last 24  hours: Vitals:   02/04/21 1500 02/04/21 2041 02/05/21 0507 02/05/21 1017  BP: (!) 98/51 126/70 131/64 130/69  Pulse: 98 (!) 106 84 (!) 108  Resp:  18 18   Temp:  98.9 F (37.2 C) 98.2 F (36.8 C)   TempSrc:  Oral Oral   SpO2:  100% 98%   Weight:      Height:       Weight change:   Intake/Output Summary (Last 24 hours) at 02/05/2021 1019 Last data filed at 02/05/2021 0900 Gross per 24 hour  Intake 614.79 ml  Output 2850 ml  Net -2235.21 ml        Labs: Basic Metabolic Panel: Recent Labs  Lab 02/03/21 0513 02/04/21 0433 02/05/21 0615  NA 137 136 135  K 3.1* 3.0* 3.0*  CL 100 102 102  CO2 26 23 21*  GLUCOSE 153* 188* 130*  BUN 32* 39* 48*  CREATININE 2.65* 2.85* 2.96*  3.10*  CALCIUM 6.7* 6.6* 6.8*  PHOS  --   --  4.0    Liver Function Tests: Recent Labs  Lab 02/02/21 1420 02/05/21 0615  AST 17  --   ALT 14  --   ALKPHOS 176*  --   BILITOT 0.5  --   PROT 6.2*  --   ALBUMIN 2.5* 1.8*    No results for input(s): LIPASE, AMYLASE in the last 168 hours. No results for input(s): AMMONIA in the last 168 hours. CBC: Recent Labs  Lab 02/02/21 1420 02/03/21 0513 02/04/21 0433 02/05/21 0615  WBC 17.4* 12.6* 13.3* 9.3  NEUTROABS 16.2*  --   --  7.6  HGB 9.0* 8.1* 7.5* 8.1*  HCT 29.6* 27.6* 25.7* 27.4*  MCV 99.7 101.1* 101.2* 100.4*  PLT 204 173 171 194    Cardiac Enzymes: No results for input(s): CKTOTAL, CKMB, CKMBINDEX, TROPONINI in the last 168 hours. CBG: Recent Labs  Lab 02/04/21 0720 02/04/21 1119 02/04/21 1627 02/04/21 2043 02/05/21 0731  GLUCAP 169* 262* 232* 148* 133*     Iron Studies: No results for input(s): IRON, TIBC, TRANSFERRIN, FERRITIN in the last 72 hours. Studies/Results: No results found.  Medications: Infusions:  cefTRIAXone (ROCEPHIN)  IV 2 g (02/04/21 2220)    Scheduled Medications:  aspirin EC  81 mg Oral QHS   calcitRIOL  0.25 mcg Oral Daily   calcium acetate  1,334 mg Oral TID WC   Chlorhexidine Gluconate  Cloth  6 each Topical Daily   diltiazem  180 mg Oral Daily   heparin  5,000 Units Subcutaneous Q8H   insulin aspart  0-5 Units Subcutaneous QHS   insulin aspart  0-6 Units Subcutaneous TID WC   insulin aspart  8 Units Subcutaneous TID WC   insulin glargine  4 Units Subcutaneous QHS   lipase/protease/amylase  12,000 Units Oral TID AC   metoprolol tartrate  50 mg Oral BID   pravastatin  10 mg Oral q1800   saccharomyces boulardii  250 mg Oral BID    have reviewed scheduled and prn medications.  Physical Exam: General: Lying on bed comfortable, not in distress. Heart:RRR, s1s2 nl, no rub Lungs: Clear bilateral, no wheezing or crackle Abdomen:soft, Non-tender, non-distended Extremities:No LE edema Dialysis Access: Right IJ [facility]: Alert, awake and following commands.  Anarely Nicholls Prasad Jone Panebianco 02/05/2021,10:19 AM  LOS: 3 days

## 2021-02-05 NOTE — Plan of Care (Signed)
  Problem: Acute Rehab PT Goals(only PT should resolve) Goal: Patient Will Transfer Sit To/From Stand Outcome: Progressing Flowsheets (Taken 02/05/2021 1456) Patient will transfer sit to/from stand:  with minimal assist  with moderate assist Goal: Pt Will Transfer Bed To Chair/Chair To Bed Outcome: Progressing Flowsheets (Taken 02/05/2021 1456) Pt will Transfer Bed to Chair/Chair to Bed:  with min assist  with mod assist Goal: Pt Will Ambulate Outcome: Progressing Flowsheets (Taken 02/05/2021 1456) Pt will Ambulate:  25 feet  with least restrictive assistive device  with minimal assist Goal: Pt/caregiver will Perform Home Exercise Program Outcome: Progressing Flowsheets (Taken 02/05/2021 1456) Pt/caregiver will Perform Home Exercise Program:  For increased strengthening  For improved balance  Independently  2:57 PM, 02/05/21 Mearl Latin PT, DPT Physical Therapist at [facility]

## 2021-02-05 NOTE — Evaluation (Signed)
Physical Therapy Evaluation Patient Details Name: Mark GOLDA Sr. MRN: [id number] DOB: January 15, 1941 Today's Date: 02/05/2021   History of Present Illness  Mark L Schlageter Sr. is a 80 y.o. male with a history of type 2 diabetes on insulin, hypertension, end-stage renal disease secondary to diabetic nephropathy, nonalcoholic fatty liver disease, CAD, grade 1 diastolic dysfunction.  Patient was receiving dialysis earlier today and the staff noticed that he was not as responsive as he normally has.  He did have a fever and was sent to the hospital for evaluation by EMS.  CBG was noted to be 36 on their arrival.  They gave 1 amp of glucagon with a response of CBG 88.  On arrival here, his CBG was 48 and another amp of D50 was given.  D5 LR was added.  Patient continues to have variable mentation.  Initial temperature was 101.  Blood cultures obtained and the patient was started on broad-spectrum antibiotics.  Initial lactic acid was 1.6.  White count 17.4.  Bladder scan was done showing about 600 mL of urine in his bladder.  UA collected which is suggestive of bladder infection.   Clinical Impression  Patient limited for functional mobility as stated below secondary to BLE weakness, fatigue and poor standing balance. Patient requires increased time for bed mobility to transfer to seated EOB. He demonstrates good sitting balance and tolerance EOB. Patient unable to transfer to standing from bed in standard position despite cueing, RW use, and assist secondary to impaired LE strength. Patient requires elevated bed and mod assist with RW to transfer to standing. Patient demonstrates fair standing tolerance and completes standing marches with bilateral UE support. Patient ambulates lateral steps at bedside with RW. Patient will benefit from continued physical therapy in hospital and recommended venue below to increase strength, balance, endurance for safe ADLs and gait.     Follow Up Recommendations SNF     Equipment Recommendations  None recommended by PT    Recommendations for Other Services       Precautions / Restrictions Precautions Precautions: Fall Restrictions Weight Bearing Restrictions: No      Mobility  Bed Mobility Overal bed mobility: Modified Independent             General bed mobility comments: increased time, labored movement    Transfers Overall transfer level: Needs assistance Equipment used: Rolling walker (2 wheeled) Transfers: Sit to/from Stand Sit to Stand: Mod assist;From elevated surface         General transfer comment: slow labored movement, unable to transfer to standing from lowered bed with assist, required bed elevated  Ambulation/Gait Ambulation/Gait assistance: Min assist Gait Distance (Feet): 4 Feet Assistive device: Rolling walker (2 wheeled)       General Gait Details: lateral steps at bedside  Stairs            Wheelchair Mobility    Modified Rankin (Stroke Patients Only)       Balance                                             Pertinent Vitals/Pain Pain Assessment: No/denies pain    Home Living Family/patient expects to be discharged to:: Skilled nursing facility                      Prior Function Level of Independence: Needs assistance  Gait / Transfers Assistance Needed: Patient states he has been using RW and wheelchair at rehab  ADL's / [place] Needed: assisted by staff at SNF        Hand Dominance        Extremity/Trunk Assessment             Cervical / Trunk Assessment Cervical / Trunk Assessment: Normal  Communication      Cognition Arousal/Alertness: Awake/alert Behavior During Therapy: WFL for tasks assessed/performed Overall Cognitive Status: Within Functional Limits for tasks assessed                                        General Comments      Exercises General Exercises - Lower Extremity Hip  Flexion/Marching: AROM;Both;20 reps;Standing   Assessment/Plan    PT Assessment Patient needs continued PT services  PT Problem List Decreased strength;Decreased activity tolerance;Decreased balance;Decreased mobility       PT Treatment Interventions DME instruction;Gait training;Stair training;Functional mobility training;Therapeutic activities;Therapeutic exercise;Patient/family education;Balance training    PT Goals (Current goals can be found in the Care Plan section)  Acute Rehab PT Goals Patient Stated Goal: return home after rehab PT Goal Formulation: With patient/family Time For Goal Achievement: 02/19/21 Potential to Achieve Goals: Good    Frequency Min 3X/week   Barriers to discharge        Co-evaluation               AM-PAC PT "6 Clicks" Mobility  Outcome Measure Help needed turning from your back to your side while in a flat bed without using bedrails?: A Little Help needed moving from lying on your back to sitting on the side of a flat bed without using bedrails?: A Little Help needed moving to and from a bed to a chair (including a wheelchair)?: A Lot Help needed standing up from a chair using your arms (e.g., wheelchair or bedside chair)?: A Lot Help needed to walk in hospital room?: A Lot Help needed climbing 3-5 steps with a railing? : A Lot 6 Click Score: 14    End of Session Equipment Utilized During Treatment: Gait belt Activity Tolerance: Patient tolerated treatment well Patient left: in bed;with call bell/phone within reach;with bed alarm set Nurse Communication: Mobility status PT Visit Diagnosis: Unsteadiness on feet (R26.81);Other abnormalities of gait and mobility (R26.89);Muscle weakness (generalized) (M62.81)    Time: 6295-2841 PT Time Calculation (min) (ACUTE ONLY): 15 min   Charges:   PT Evaluation $PT Eval Low Complexity: 1 Low PT Treatments $Therapeutic Activity: 8-22 mins        2:55 PM, 02/05/21 Mearl Latin PT,  DPT Physical Therapist at [facility]

## 2021-02-05 NOTE — Progress Notes (Signed)
PROGRESS NOTE    [name]  HUD:[id number] DOB: 07-25-1941 DOA: 02/02/2021 PCP: [name], MD   Brief Narrative:   Mark Jumbo Sr. is a 80 y.o. male with a history of type 2 diabetes on insulin, hypertension, end-stage renal disease secondary to diabetic nephropathy, nonalcoholic fatty liver disease, CAD, grade 1 diastolic dysfunction.  He was brought to the ED with altered mentation that was noticed during dialysis.  He was also noted to be hypoglycemic and has a UTI.  Urine culture showing growth of E. coli.  He continues to have very good urine output.  Assessment & Plan:   Principal Problem:   Sepsis ([place]) Active Problems:   Essential hypertension, benign   DM type 2 with diabetic peripheral neuropathy (HCC)   Metabolic encephalopathy   ESRD (end stage renal disease) ([place])   Hypoglycemia   Acute lower UTI   Sepsis secondary to E. coli UTI present on admission-resolving -Plan to continue Rocephin for total 3 days -Fever has resolved   Acute metabolic encephalopathy secondary to above-resolved -Likely also multifactorial with hypoglycemia -Started renal/carb modified diet   Hypoglycemia in the setting of insulin-dependent diabetes-resolved -Appears not to be getting his insulin properly at SNF, uncertain what the problem is, but would likely require decreasing dosage upon discharge -Now eating -Starting to become hyperglycemic and will resume some home insulin   Hypokalemia/hypomagnesemia -Replete -Recheck in a.m.   History of hypertension -Currently stable, continue home medications   AKI on CKD -Great urine output noted and possibly may not require any further dialysis -Further lab work in a.m. per nephrology -Likely follow-up in 1 week outpatient with removal of dialysis catheter at that time   DVT prophylaxis: Heparin Code Status: Full Family Communication: Discussed with wife at bedside 7/2 Disposition Plan:  Status is: Inpatient    Remains inpatient appropriate because:Altered mental status, IV treatments appropriate due to intensity of illness or inability to take PO, and Inpatient level of care appropriate due to severity of illness   Dispo: The patient is from: SNF              Anticipated d/c is to: SNF vs home              Patient currently is not medically stable to d/c.              Difficult to place patient No     Nutritional Assessment:   The patient's BMI is: Body mass index is 25.98 kg/m.Marland Kitchen   Seen by dietician.  I agree with the assessment and plan as outlined below:   Nutrition Status:  Skin Assessment:   I have examined the patient's skin and I agree with the wound assessment as performed by the wound care RN as outlined below:   Pressure Injury 01/04/21 Buttocks Left;Medial Stage 2 -  Partial thickness loss of dermis presenting as a shallow open injury with a red, pink wound bed without slough. 2cm x 1 cm, pink wound base, no drainage or odor (Active)  01/04/21 1115  Location: Buttocks  Location Orientation: Left;Medial  Staging: Stage 2 -  Partial thickness loss of dermis presenting as a shallow open injury with a red, pink wound bed without slough.  Wound Description (Comments): 2cm x 1 cm, pink wound base, no drainage or odor  Present on Admission:       Consultants:  Nephrology   Procedures:  See below   Antimicrobials:  Anti-infectives (From admission, onward)  Start     Dose/Rate Route Frequency Ordered Stop   02/04/21 1500  vancomycin (VANCOREADY) IVPB 1250 mg/250 mL  Status:  Discontinued        1,250 mg 166.7 mL/hr over 90 Minutes Intravenous Every 48 hours 02/02/21 1459 02/02/21 1631   02/02/21 2200  cefTRIAXone (ROCEPHIN) 2 g in sodium chloride 0.9 % 100 mL IVPB        2 g 200 mL/hr over 30 Minutes Intravenous Every 24 hours 02/02/21 1631     02/02/21 1445  vancomycin (VANCOREADY) IVPB 1750 mg/350 mL  Status:  Discontinued        1,750 mg 175 mL/hr over 120 Minutes  Intravenous  Once 02/02/21 1428 02/02/21 1631   02/02/21 1430  ceFEPIme (MAXIPIME) 2 g in sodium chloride 0.9 % 100 mL IVPB  Status:  Discontinued        2 g 200 mL/hr over 30 Minutes Intravenous  Once 02/02/21 1418 02/02/21 1427   02/02/21 1430  metroNIDAZOLE (FLAGYL) IVPB 500 mg        500 mg 100 mL/hr over 60 Minutes Intravenous  Once 02/02/21 1418 02/02/21 1548   02/02/21 1430  vancomycin (VANCOCIN) IVPB 1000 mg/200 mL premix  Status:  Discontinued        1,000 mg 200 mL/hr over 60 Minutes Intravenous  Once 02/02/21 1418 02/02/21 1428   02/02/21 1430  ceFEPIme (MAXIPIME) 2 g in sodium chloride 0.9 % 100 mL IVPB  Status:  Discontinued        2 g 200 mL/hr over 30 Minutes Intravenous Every 24 hours 02/02/21 1427 02/02/21 1631       Subjective: Patient seen and evaluated today with no new acute complaints or concerns. No acute concerns or events noted overnight.  Objective: Vitals:   02/04/21 1500 02/04/21 2041 02/05/21 0507 02/05/21 1017  BP: (!) 98/51 126/70 131/64 130/69  Pulse: 98 (!) 106 84 (!) 108  Resp:  18 18   Temp:  98.9 F (37.2 C) 98.2 F (36.8 C)   TempSrc:  Oral Oral   SpO2:  100% 98%   Weight:      Height:        Intake/Output Summary (Last 24 hours) at 02/05/2021 1048 Last data filed at 02/05/2021 0900 Gross per 24 hour  Intake 614.79 ml  Output 2850 ml  Net -2235.21 ml   Filed Weights   02/02/21 1401 02/03/21 0400  Weight: 78.7 kg 77.5 kg    Examination:  General exam: Appears calm and comfortable  Respiratory system: Clear to auscultation. Respiratory effort normal. Cardiovascular system: S1 & S2 heard, RRR.  Gastrointestinal system: Abdomen is soft Central nervous system: Alert and awake Extremities: No edema Skin: No significant lesions noted Psychiatry: Flat affect.    Data Reviewed: I have personally reviewed following labs and imaging studies  CBC: Recent Labs  Lab 02/02/21 1420 02/03/21 0513 02/04/21 0433 02/05/21 0615  WBC  17.4* 12.6* 13.3* 9.3  NEUTROABS 16.2*  --   --  7.6  HGB 9.0* 8.1* 7.5* 8.1*  HCT 29.6* 27.6* 25.7* 27.4*  MCV 99.7 101.1* 101.2* 100.4*  PLT 204 173 171 627   Basic Metabolic Panel: Recent Labs  Lab 02/02/21 1420 02/03/21 0513 02/04/21 0433 02/05/21 0615  NA 136 137 136 135  K 3.1* 3.1* 3.0* 3.0*  CL 99 100 102 102  CO2 25 26 23  21*  GLUCOSE 52* 153* 188* 130*  BUN 27* 32* 39* 48*  CREATININE 2.49* 2.65* 2.85* 2.96*  3.10*  CALCIUM 6.8* 6.7* 6.6* 6.8*  MG  --   --  1.2* 1.5*  PHOS  --   --   --  4.0   GFR: Estimated Creatinine Clearance: 18.7 mL/min (A) (by C-G formula based on SCr of 3.1 mg/dL (H)). Liver Function Tests: Recent Labs  Lab 02/02/21 1420 02/05/21 0615  AST 17  --   ALT 14  --   ALKPHOS 176*  --   BILITOT 0.5  --   PROT 6.2*  --   ALBUMIN 2.5* 1.8*   No results for input(s): LIPASE, AMYLASE in the last 168 hours. No results for input(s): AMMONIA in the last 168 hours. Coagulation Profile: Recent Labs  Lab 02/02/21 1420 02/03/21 0513  INR 1.5* 1.7*   Cardiac Enzymes: No results for input(s): CKTOTAL, CKMB, CKMBINDEX, TROPONINI in the last 168 hours. BNP (last 3 results) No results for input(s): PROBNP in the last 8760 hours. HbA1C: No results for input(s): HGBA1C in the last 72 hours. CBG: Recent Labs  Lab 02/04/21 0720 02/04/21 1119 02/04/21 1627 02/04/21 2043 02/05/21 0731  GLUCAP 169* 262* 232* 148* 133*   Lipid Profile: No results for input(s): CHOL, HDL, LDLCALC, TRIG, CHOLHDL, LDLDIRECT in the last 72 hours. Thyroid Function Tests: No results for input(s): TSH, T4TOTAL, FREET4, T3FREE, THYROIDAB in the last 72 hours. Anemia Panel: No results for input(s): VITAMINB12, FOLATE, FERRITIN, TIBC, IRON, RETICCTPCT in the last 72 hours. Sepsis Labs: Recent Labs  Lab 02/02/21 1436 02/02/21 1617 02/02/21 1625 02/03/21 0513 02/04/21 0433  PROCALCITON  --   --  1.72 2.86 2.63  LATICACIDVEN 1.6 1.1  --   --   --     Recent  Results (from the past 240 hour(s))  MRSA Next Gen by PCR, Nasal     Status: None   Collection Time: 02/02/21  2:30 AM   Specimen: Nasal Mucosa; Nasal Swab  Result Value Ref Range Status   MRSA by PCR Next Gen NOT DETECTED NOT DETECTED Final    Comment: (NOTE) The GeneXpert MRSA Assay (FDA approved for NASAL specimens only), is one component of a comprehensive MRSA colonization surveillance program. It is not intended to diagnose MRSA infection nor to guide or monitor treatment for MRSA infections. Test performance is not FDA approved in patients less than 44 years old. Performed at [facility], [address] [place], [place] [address]   Resp Panel by RT-PCR (Flu A&B, Covid) Urine, Catheterized     Status: None   Collection Time: 02/02/21  2:30 PM   Specimen: Urine, Catheterized; Nasopharyngeal(NP) swabs in vial transport medium  Result Value Ref Range Status   SARS Coronavirus 2 by RT PCR NEGATIVE NEGATIVE Final    Comment: (NOTE) SARS-CoV-2 target nucleic acids are NOT DETECTED.  The SARS-CoV-2 RNA is generally detectable in upper respiratory specimens during the acute phase of infection. The lowest concentration of SARS-CoV-2 viral copies this assay can detect is 138 copies/mL. A negative result does not preclude SARS-Cov-2 infection and should not be used as the sole basis for treatment or other patient management decisions. A negative result may occur with  improper specimen collection/handling, submission of specimen other than nasopharyngeal swab, presence of viral mutation(s) within the areas targeted by this assay, and inadequate number of viral copies(<138 copies/mL). A negative result must be combined with clinical observations, patient history, and epidemiological information. The expected result is Negative.  Fact Sheet for Patients:  EntrepreneurPulse.com.au  Fact Sheet for Healthcare Providers:  IncredibleEmployment.be  This  test is no t yet approved or cleared by the [place] and  has been authorized for detection and/or diagnosis of SARS-CoV-2 by FDA under an Emergency Use Authorization (EUA). This EUA will remain  in effect (meaning this test can be used) for the duration of the COVID-19 declaration under Section 564(b)(1) of the Act, 21 U.S.C.section 360bbb-3(b)(1), unless the authorization is terminated  or revoked sooner.       Influenza A by PCR NEGATIVE NEGATIVE Final   Influenza B by PCR NEGATIVE NEGATIVE Final    Comment: (NOTE) The Xpert Xpress SARS-CoV-2/FLU/RSV plus assay is intended as an aid in the diagnosis of influenza from Nasopharyngeal swab specimens and should not be used as a sole basis for treatment. Nasal washings and aspirates are unacceptable for Xpert Xpress SARS-CoV-2/FLU/RSV testing.  Fact Sheet for Patients: EntrepreneurPulse.com.au  Fact Sheet for Healthcare Providers: IncredibleEmployment.be  This test is not yet approved or cleared by the [place] FDA and has been authorized for detection and/or diagnosis of SARS-CoV-2 by FDA under an Emergency Use Authorization (EUA). This EUA will remain in effect (meaning this test can be used) for the duration of the COVID-19 declaration under Section 564(b)(1) of the Act, 21 U.S.C. section 360bbb-3(b)(1), unless the authorization is terminated or revoked.  Performed at [facility], [address], [place], [place] [address]   Blood Culture (routine x 2)     Status: None (Preliminary result)   Collection Time: 02/02/21  2:36 PM   Specimen: BLOOD  Result Value Ref Range Status   Specimen Description BLOOD LEFT ARM  Final   Special Requests   Final    BOTTLES DRAWN AEROBIC AND ANAEROBIC Blood Culture adequate volume   Culture   Final    NO GROWTH 2 DAYS Performed at [facility], [address], [place], [place] [address]    Report Status PENDING  Incomplete  Blood Culture  (routine x 2)     Status: None (Preliminary result)   Collection Time: 02/02/21  2:36 PM   Specimen: BLOOD  Result Value Ref Range Status   Specimen Description BLOOD RIGHT HAND  Final   Special Requests   Final    BOTTLES DRAWN AEROBIC AND ANAEROBIC Blood Culture results may not be optimal due to an inadequate volume of blood received in culture bottles   Culture   Final    NO GROWTH 2 DAYS Performed at [facility], [address] [place], [place] [address]    Report Status PENDING  Incomplete  Urine culture     Status: Abnormal   Collection Time: 02/02/21  2:50 PM   Specimen: Urine, Catheterized  Result Value Ref Range Status   Specimen Description   Final    URINE, CATHETERIZED Performed at [facility], [address], [place], [place] [address]    Special Requests   Final    NONE Performed at [facility], [address] [place], [place] [address]    Culture >=100,000 COLONIES/mL ESCHERICHIA COLI (A)  Final   Report Status 02/05/2021 FINAL  Final   Organism ID, Bacteria ESCHERICHIA COLI (A)  Final      Susceptibility   Escherichia coli - MIC*    AMPICILLIN >=32 RESISTANT Resistant     CEFAZOLIN <=4 SENSITIVE Sensitive     CEFEPIME <=0.12 SENSITIVE Sensitive     CEFTRIAXONE <=0.25 SENSITIVE Sensitive     CIPROFLOXACIN <=0.25 SENSITIVE Sensitive     GENTAMICIN >=16 RESISTANT Resistant  IMIPENEM <=0.25 SENSITIVE Sensitive     NITROFURANTOIN <=16 SENSITIVE Sensitive     TRIMETH/SULFA >=320 RESISTANT Resistant     AMPICILLIN/SULBACTAM 16 INTERMEDIATE Intermediate     PIP/TAZO <=4 SENSITIVE Sensitive     * >=100,000 COLONIES/mL ESCHERICHIA COLI         Radiology Studies: No results found.      Scheduled Meds:  aspirin EC  81 mg Oral QHS   calcitRIOL  0.25 mcg Oral Daily   calcium acetate  1,334 mg Oral TID WC   Chlorhexidine Gluconate Cloth  6 each Topical Daily   diltiazem  180 mg Oral Daily   heparin  5,000 Units Subcutaneous Q8H   insulin aspart  0-5  Units Subcutaneous QHS   insulin aspart  0-6 Units Subcutaneous TID WC   insulin aspart  8 Units Subcutaneous TID WC   insulin glargine  4 Units Subcutaneous QHS   lipase/protease/amylase  12,000 Units Oral TID AC   metoprolol tartrate  50 mg Oral BID   potassium chloride  40 mEq Oral Once   pravastatin  10 mg Oral q1800   saccharomyces boulardii  250 mg Oral BID   Continuous Infusions:  cefTRIAXone (ROCEPHIN)  IV 2 g (02/04/21 2220)   magnesium sulfate bolus IVPB       LOS: 3 days    Time spent: 35 minutes    Anuj Summons Darleen Crocker, DO [facility]  If 7PM-7AM, please contact night-coverage [email or web address] 02/05/2021, 10:48 AM

## 2021-02-06 DIAGNOSIS — R739 Hyperglycemia, unspecified: Secondary | ICD-10-CM | POA: Diagnosis not present

## 2021-02-06 DIAGNOSIS — M130 Polyarthritis, unspecified: Secondary | ICD-10-CM | POA: Diagnosis not present

## 2021-02-06 DIAGNOSIS — K8689 Other specified diseases of pancreas: Secondary | ICD-10-CM | POA: Diagnosis not present

## 2021-02-06 DIAGNOSIS — R531 Weakness: Secondary | ICD-10-CM | POA: Diagnosis not present

## 2021-02-06 DIAGNOSIS — E872 Acidosis: Secondary | ICD-10-CM | POA: Diagnosis not present

## 2021-02-06 DIAGNOSIS — K7581 Nonalcoholic steatohepatitis (NASH): Secondary | ICD-10-CM | POA: Diagnosis not present

## 2021-02-06 DIAGNOSIS — R002 Palpitations: Secondary | ICD-10-CM | POA: Diagnosis not present

## 2021-02-06 DIAGNOSIS — K760 Fatty (change of) liver, not elsewhere classified: Secondary | ICD-10-CM | POA: Diagnosis not present

## 2021-02-06 DIAGNOSIS — Z794 Long term (current) use of insulin: Secondary | ICD-10-CM | POA: Diagnosis not present

## 2021-02-06 DIAGNOSIS — N1832 Chronic kidney disease, stage 3b: Secondary | ICD-10-CM | POA: Diagnosis not present

## 2021-02-06 DIAGNOSIS — N2581 Secondary hyperparathyroidism of renal origin: Secondary | ICD-10-CM | POA: Diagnosis not present

## 2021-02-06 DIAGNOSIS — Z7982 Long term (current) use of aspirin: Secondary | ICD-10-CM | POA: Diagnosis not present

## 2021-02-06 DIAGNOSIS — L603 Nail dystrophy: Secondary | ICD-10-CM | POA: Diagnosis not present

## 2021-02-06 DIAGNOSIS — I5030 Unspecified diastolic (congestive) heart failure: Secondary | ICD-10-CM | POA: Diagnosis not present

## 2021-02-06 DIAGNOSIS — L089 Local infection of the skin and subcutaneous tissue, unspecified: Secondary | ICD-10-CM | POA: Diagnosis not present

## 2021-02-06 DIAGNOSIS — I77810 Thoracic aortic ectasia: Secondary | ICD-10-CM | POA: Diagnosis not present

## 2021-02-06 DIAGNOSIS — E1159 Type 2 diabetes mellitus with other circulatory complications: Secondary | ICD-10-CM | POA: Diagnosis not present

## 2021-02-06 DIAGNOSIS — D631 Anemia in chronic kidney disease: Secondary | ICD-10-CM | POA: Diagnosis not present

## 2021-02-06 DIAGNOSIS — G4720 Circadian rhythm sleep disorder, unspecified type: Secondary | ICD-10-CM | POA: Diagnosis not present

## 2021-02-06 DIAGNOSIS — E7220 Disorder of urea cycle metabolism, unspecified: Secondary | ICD-10-CM | POA: Diagnosis not present

## 2021-02-06 DIAGNOSIS — N170 Acute kidney failure with tubular necrosis: Secondary | ICD-10-CM | POA: Diagnosis not present

## 2021-02-06 DIAGNOSIS — J9811 Atelectasis: Secondary | ICD-10-CM | POA: Diagnosis not present

## 2021-02-06 DIAGNOSIS — R410 Disorientation, unspecified: Secondary | ICD-10-CM | POA: Diagnosis not present

## 2021-02-06 DIAGNOSIS — M2041 Other hammer toe(s) (acquired), right foot: Secondary | ICD-10-CM | POA: Diagnosis not present

## 2021-02-06 DIAGNOSIS — I130 Hypertensive heart and chronic kidney disease with heart failure and stage 1 through stage 4 chronic kidney disease, or unspecified chronic kidney disease: Secondary | ICD-10-CM | POA: Diagnosis not present

## 2021-02-06 DIAGNOSIS — M6281 Muscle weakness (generalized): Secondary | ICD-10-CM | POA: Diagnosis not present

## 2021-02-06 DIAGNOSIS — Z452 Encounter for adjustment and management of vascular access device: Secondary | ICD-10-CM | POA: Diagnosis not present

## 2021-02-06 DIAGNOSIS — N186 End stage renal disease: Secondary | ICD-10-CM | POA: Diagnosis not present

## 2021-02-06 DIAGNOSIS — R262 Difficulty in walking, not elsewhere classified: Secondary | ICD-10-CM | POA: Diagnosis not present

## 2021-02-06 DIAGNOSIS — E876 Hypokalemia: Secondary | ICD-10-CM | POA: Diagnosis not present

## 2021-02-06 DIAGNOSIS — Z992 Dependence on renal dialysis: Secondary | ICD-10-CM | POA: Diagnosis not present

## 2021-02-06 DIAGNOSIS — E1140 Type 2 diabetes mellitus with diabetic neuropathy, unspecified: Secondary | ICD-10-CM | POA: Diagnosis not present

## 2021-02-06 DIAGNOSIS — I2510 Atherosclerotic heart disease of native coronary artery without angina pectoris: Secondary | ICD-10-CM | POA: Diagnosis not present

## 2021-02-06 DIAGNOSIS — T8130XA Disruption of wound, unspecified, initial encounter: Secondary | ICD-10-CM | POA: Diagnosis not present

## 2021-02-06 DIAGNOSIS — I10 Essential (primary) hypertension: Secondary | ICD-10-CM | POA: Diagnosis not present

## 2021-02-06 DIAGNOSIS — G9341 Metabolic encephalopathy: Secondary | ICD-10-CM | POA: Diagnosis not present

## 2021-02-06 DIAGNOSIS — M2042 Other hammer toe(s) (acquired), left foot: Secondary | ICD-10-CM | POA: Diagnosis not present

## 2021-02-06 DIAGNOSIS — R404 Transient alteration of awareness: Secondary | ICD-10-CM | POA: Diagnosis not present

## 2021-02-06 DIAGNOSIS — N179 Acute kidney failure, unspecified: Secondary | ICD-10-CM | POA: Diagnosis not present

## 2021-02-06 DIAGNOSIS — I471 Supraventricular tachycardia: Secondary | ICD-10-CM | POA: Diagnosis not present

## 2021-02-06 DIAGNOSIS — A419 Sepsis, unspecified organism: Secondary | ICD-10-CM | POA: Diagnosis not present

## 2021-02-06 DIAGNOSIS — H9191 Unspecified hearing loss, right ear: Secondary | ICD-10-CM | POA: Diagnosis not present

## 2021-02-06 DIAGNOSIS — N390 Urinary tract infection, site not specified: Secondary | ICD-10-CM | POA: Diagnosis not present

## 2021-02-06 DIAGNOSIS — E785 Hyperlipidemia, unspecified: Secondary | ICD-10-CM | POA: Diagnosis not present

## 2021-02-06 DIAGNOSIS — E875 Hyperkalemia: Secondary | ICD-10-CM | POA: Diagnosis not present

## 2021-02-06 DIAGNOSIS — I509 Heart failure, unspecified: Secondary | ICD-10-CM | POA: Diagnosis not present

## 2021-02-06 DIAGNOSIS — E1122 Type 2 diabetes mellitus with diabetic chronic kidney disease: Secondary | ICD-10-CM | POA: Diagnosis not present

## 2021-02-06 DIAGNOSIS — I129 Hypertensive chronic kidney disease with stage 1 through stage 4 chronic kidney disease, or unspecified chronic kidney disease: Secondary | ICD-10-CM | POA: Diagnosis not present

## 2021-02-06 DIAGNOSIS — E119 Type 2 diabetes mellitus without complications: Secondary | ICD-10-CM | POA: Diagnosis not present

## 2021-02-06 DIAGNOSIS — L02212 Cutaneous abscess of back [any part, except buttock]: Secondary | ICD-10-CM | POA: Diagnosis not present

## 2021-02-06 DIAGNOSIS — N184 Chronic kidney disease, stage 4 (severe): Secondary | ICD-10-CM | POA: Diagnosis not present

## 2021-02-06 LAB — MAGNESIUM: Magnesium: 1.7 mg/dL (ref 1.7–2.4)

## 2021-02-06 LAB — GLUCOSE, CAPILLARY
Glucose-Capillary: 145 mg/dL — ABNORMAL HIGH (ref 70–99)
Glucose-Capillary: 207 mg/dL — ABNORMAL HIGH (ref 70–99)
Glucose-Capillary: 135 mg/dL — ABNORMAL HIGH (ref 70–99)
Glucose-Capillary: 49 mg/dL — ABNORMAL LOW (ref 70–99)
Glucose-Capillary: 64 mg/dL — ABNORMAL LOW (ref 70–99)

## 2021-02-06 LAB — BASIC METABOLIC PANEL
Anion gap: 15 (ref 5–15)
BUN: 53 mg/dL — ABNORMAL HIGH (ref 8–23)
CO2: 20 mmol/L — ABNORMAL LOW (ref 22–32)
Calcium: 7.7 mg/dL — ABNORMAL LOW (ref 8.9–10.3)
Chloride: 103 mmol/L (ref 98–111)
Creatinine, Ser: 3.03 mg/dL — ABNORMAL HIGH (ref 0.61–1.24)
GFR, Estimated: 20 mL/min — ABNORMAL LOW (ref >60)
Glucose, Bld: 137 mg/dL — ABNORMAL HIGH (ref 70–99)
Potassium: 3.7 mmol/L (ref 3.5–5.1)
Sodium: 138 mmol/L (ref 135–145)

## 2021-02-06 LAB — RESP PANEL BY RT-PCR (FLU A&B, COVID) ARPGX2
Influenza A by PCR: NEGATIVE
Influenza B by PCR: NEGATIVE
SARS Coronavirus 2 by RT PCR: NEGATIVE

## 2021-02-06 MED ORDER — INSULIN ASPART 100 UNIT/ML IJ SOLN: 8.0000 [IU] | Freq: Three times a day (TID) | INTRAMUSCULAR | 11 refills | Status: DC

## 2021-02-06 MED ORDER — INSULIN GLARGINE 100 UNIT/ML ~~LOC~~ SOLN: 4.0000 [IU] | Freq: Every day | SUBCUTANEOUS | 11 refills | Status: DC

## 2021-02-06 NOTE — Progress Notes (Signed)
Hypoglycemic Event  CBG: 49  Treatment: 4 oz juice/soda  Symptoms: None  Follow-up CBG: Time:17:02 CBG Result:64 Follow up- CBG Time 17:26   CBG results 135  Possible Reasons for Event: Unknown  Comments/MD notified: MD Heath Lark D/C insulin     Clovis Fredrickson

## 2021-02-06 NOTE — NC FL2 (Signed)
[place] LEVEL OF CARE SCREENING TOOL     IDENTIFICATION  Patient Name: [name]. Birthdate: 1941/04/04 Sex: male Admission Date (Current Location): 02/02/2021  [facility] and [place] Number:  [employer] and Address:  [facility] [address] [place]      Provider Number: [id number]  Attending Physician Name and Address:  Rodena Goldmann, DO  Relative Name and Phone Number:       Current Level of Care: Hospital Recommended Level of Care: [place] Prior Approval Number:    Date Approved/Denied:   PASRR Number: [id number]  Discharge Plan: SNF    Current Diagnoses: Patient Active Problem List   Diagnosis Date Noted   Metabolic encephalopathy 02/77/4128   ESRD (end stage renal disease) ([place]) 02/02/2021   Hypoglycemia 02/02/2021   Sepsis ([place]) 02/02/2021   Acute lower UTI 02/02/2021   Vascular dialysis catheter in place [facility])    Shingles 01/14/2021   Pressure injury of skin 01/09/2021   SVT (supraventricular tachycardia) ([place]) 01/07/2021   Acute renal failure superimposed on stage 3b chronic kidney disease ([place]) 78/67/6720   Acute metabolic encephalopathy 94/70/9628   COVID-19 virus infection 36/62/9476   Metabolic acidosis    AKI (acute kidney injury) ([place]) 12/23/2020   Hyperkalemia 12/02/2018   Fever, unknown origin 12/02/2018   Altered mental state 12/02/2018   Acute on chronic renal failure ([place]) 12/02/2018   Pancreatic insufficiency 12/02/2018   Wears hearing aid 12/02/2018   HOH (hard of hearing) 12/02/2018   DM type 2 with diabetic peripheral neuropathy ([place]) 09/15/2018   Uncontrolled type 2 diabetes mellitus with hyperglycemia ([place]) 09/15/2018   Peripheral edema 06/25/2018   NAFLD (nonalcoholic fatty liver disease)    Diabetic retinopathy ([place])    ED (erectile dysfunction) 01/05/2014   Shortness of breath 12/05/2011   Coronary atherosclerosis of native coronary artery  12/05/2011   Type 2 diabetes mellitus ([place]) 12/05/2011   Essential hypertension, benign 12/05/2011   Mixed hyperlipidemia 12/05/2011   Malignant neoplasm of prostate ([place]) 06/26/2011    Orientation RESPIRATION BLADDER Height & Weight     Self, Time, Situation, Place  Normal Incontinent Weight: 170 lb 13.7 oz (77.5 kg) Height:  5' 8"  (172.7 cm)  BEHAVIORAL SYMPTOMS/MOOD NEUROLOGICAL BOWEL NUTRITION STATUS      Incontinent Diet (see dc summary)  AMBULATORY STATUS COMMUNICATION OF NEEDS Skin   Extensive Assist Verbally Normal                       Personal Care Assistance Level of Assistance    Bathing Assistance: Limited assistance Feeding assistance: Independent Dressing Assistance: Limited assistance     Functional Limitations Info    Sight Info: Adequate Hearing Info: Adequate Speech Info: Adequate    SPECIAL CARE FACTORS FREQUENCY  OT (By licensed OT)     PT Frequency: 5x week OT Frequency: 3x week            Contractures Contractures Info: Not present    Additional Factors Info    Code Status Info: Full Allergies Info: Enalapril Maleate           Current Medications (02/06/2021):  This is the current hospital active medication list Current Facility-Administered Medications  Medication Dose Route Frequency Provider Last Rate Last Admin   acetaminophen (TYLENOL) tablet 650 mg  650 mg Oral Q6H PRN Truett Mainland, DO   650 mg at 02/03/21 2136   Or  acetaminophen (TYLENOL) suppository 650 mg  650 mg Rectal Q6H PRN Truett Mainland, DO       albuterol (VENTOLIN HFA) 108 (90 Base) MCG/ACT inhaler 2 puff  2 puff Inhalation Q4H PRN Truett Mainland, DO       aspirin EC tablet 81 mg  81 mg Oral QHS Truett Mainland, DO   81 mg at 02/05/21 2135   calcitRIOL (ROCALTROL) capsule 0.25 mcg  0.25 mcg Oral Daily Truett Mainland, DO   0.25 mcg at 02/06/21 1093   calcium acetate (PHOSLO) capsule 1,334 mg  1,334 mg Oral TID WC Rosita Fire, MD   1,334 mg  at 02/06/21 1153   Chlorhexidine Gluconate Cloth 2 % PADS 6 each  6 each Topical Daily Heath Lark D, DO   6 each at 02/06/21 0919   diltiazem (CARDIZEM CD) 24 hr capsule 180 mg  180 mg Oral Daily Truett Mainland, DO   180 mg at 02/06/21 2355   heparin injection 5,000 Units  5,000 Units Subcutaneous Q8H Truett Mainland, DO   5,000 Units at 02/06/21 7322   hydrOXYzine (ATARAX/VISTARIL) tablet 10 mg  10 mg Oral Q12H PRN Truett Mainland, DO       insulin aspart (novoLOG) injection 0-5 Units  0-5 Units Subcutaneous QHS Manuella Ghazi, Pratik D, DO       insulin aspart (novoLOG) injection 0-6 Units  0-6 Units Subcutaneous TID WC Manuella Ghazi, Pratik D, DO   2 Units at 02/06/21 1154   insulin aspart (novoLOG) injection 8 Units  8 Units Subcutaneous TID WC Manuella Ghazi, Pratik D, DO   8 Units at 02/06/21 1154   insulin glargine (LANTUS) injection 4 Units  4 Units Subcutaneous QHS Manuella Ghazi, Pratik D, DO       lipase/protease/amylase (CREON) capsule 12,000 Units  12,000 Units Oral TID AC Truett Mainland, DO   12,000 Units at 02/06/21 1154   metoprolol tartrate (LOPRESSOR) tablet 50 mg  50 mg Oral BID Truett Mainland, DO   50 mg at 02/06/21 0918   ondansetron (ZOFRAN) tablet 4 mg  4 mg Oral Q6H PRN Truett Mainland, DO       Or   ondansetron [facility]) injection 4 mg  4 mg Intravenous Q6H PRN Truett Mainland, DO       pravastatin (PRAVACHOL) tablet 10 mg  10 mg Oral q1800 Truett Mainland, DO   10 mg at 02/05/21 1758   saccharomyces boulardii (FLORASTOR) capsule 250 mg  250 mg Oral BID Truett Mainland, DO   250 mg at 02/06/21 0254     Discharge Medications: Please see discharge summary for a list of discharge medications.  Relevant Imaging Results:  Relevant Lab Results:   Additional Information SSN: [place]  Shade Flood, LCSW

## 2021-02-06 NOTE — Progress Notes (Addendum)
[facility] NEPHROLOGY PROGRESS NOTE  Assessment/ Plan: Pt is a 80 y.o. yo male with history of hypertension, nonalcoholic fatty liver, CAD, diastolic CHF, AKI on CKD dialysis dependent after recent prolonged hospitalization last month with COVID infection, now presented with altered mental status in the setting of hypoglycemia and sepsis, seen as a consultation for dialysis and related management.  OP HD orders: RKC, 3.5 hr, 3K, 2.5 ca, 400/800, RIJ TDC, EDW 81 kg,    #Sepsis due to urinary tract infection: Urine culture growing E. coli sensitive to ceftriaxone.  Clinically improved.   #Acute metabolic encephalopathy due to hypoglycemia: Blood sugar level and mental status has much improved.  He is now able to eat.  Insulin adjustment per primary team.  #AKI on CKD, dialysis dependent at [facility]: On dialysis after recent hospitalization. The urine output is increasing and creatinine level seems to be stable around 2.9-3.0 without needing dialysis.  The last HD was on 7/1 for only 43 minutes.  He is clinically much better without any evidence of uremia.  I think he has renal recovery and can come off dialysis.  The team is planning to remove Foley catheter and do bladder scan to make sure that he does not have urinary retention before discharge.  I have arranged follow-up appointment with Dr. Royce Macadamia at [facility] office ([place], [place]) on 02/12/2021 at 8:00, patient to arrive to the clinic at 7:45 AM.  No appointment available for next week in [place] office.  I recommend to check lab, renal panel on Friday on 7/8 at [facility].  I have discussed this with the patient, primary team and Dr. Royce Macadamia.  # Anemia: Hemoglobin below goal.  He had Mircera 150 mcg and Venofer on 6/27.  Monitor hemoglobin.  # Secondary hyperparathyroidism: Phosphorus level acceptable, continue PhosLo and calcitriol.  Monitor lab.    # HTN/volume: Blood pressure acceptable.  Volume status  acceptable.  #Hypokalemia: Started eating.  Potassium level improved.  Subjective: Patient was seen and examined.  He has been having great urine output.  The urine output is around 3 L in 24 hours.  He reports feeling good without any complaint.  Denies nausea, vomiting, chest pain, shortness of breath.  Objective Vital signs in last 24 hours: Vitals:   02/05/21 1017 02/05/21 1321 02/05/21 2131 02/06/21 0420  BP: 130/69 140/77 139/71 140/74  Pulse: (!) 108 (!) 106 86 (!) 102  Resp:  20 18 18   Temp:  98.1 F (36.7 C) 98.2 F (36.8 C) 98 F (36.7 C)  TempSrc:  Oral Oral Oral  SpO2:  100% 100% 99%  Weight:      Height:       Weight change:   Intake/Output Summary (Last 24 hours) at 02/06/2021 0908 Last data filed at 02/06/2021 0300 Gross per 24 hour  Intake 480 ml  Output 2500 ml  Net -2020 ml        Labs: Basic Metabolic Panel: Recent Labs  Lab 02/04/21 0433 02/05/21 0615 02/06/21 0649  NA 136 135 138  K 3.0* 3.0* 3.7  CL 102 102 103  CO2 23 21* 20*  GLUCOSE 188* 130* 137*  BUN 39* 48* 53*  CREATININE 2.85* 2.96*  3.10* 3.03*  CALCIUM 6.6* 6.8* 7.7*  PHOS  --  4.0  --     Liver Function Tests: Recent Labs  Lab 02/02/21 1420 02/05/21 0615  AST 17  --   ALT 14  --   ALKPHOS 176*  --  BILITOT 0.5  --   PROT 6.2*  --   ALBUMIN 2.5* 1.8*    No results for input(s): LIPASE, AMYLASE in the last 168 hours. No results for input(s): AMMONIA in the last 168 hours. CBC: Recent Labs  Lab 02/02/21 1420 02/03/21 0513 02/04/21 0433 02/05/21 0615  WBC 17.4* 12.6* 13.3* 9.3  NEUTROABS 16.2*  --   --  7.6  HGB 9.0* 8.1* 7.5* 8.1*  HCT 29.6* 27.6* 25.7* 27.4*  MCV 99.7 101.1* 101.2* 100.4*  PLT 204 173 171 194    Cardiac Enzymes: No results for input(s): CKTOTAL, CKMB, CKMBINDEX, TROPONINI in the last 168 hours. CBG: Recent Labs  Lab 02/05/21 1119 02/05/21 1612 02/05/21 2121 02/05/21 2151 02/06/21 0727  GLUCAP 282* 140* 67* 120* 145*     Iron  Studies: No results for input(s): IRON, TIBC, TRANSFERRIN, FERRITIN in the last 72 hours. Studies/Results: No results found.  Medications: Infusions:  cefTRIAXone (ROCEPHIN)  IV 2 g (02/05/21 2135)    Scheduled Medications:  aspirin EC  81 mg Oral QHS   calcitRIOL  0.25 mcg Oral Daily   calcium acetate  1,334 mg Oral TID WC   Chlorhexidine Gluconate Cloth  6 each Topical Daily   diltiazem  180 mg Oral Daily   heparin  5,000 Units Subcutaneous Q8H   insulin aspart  0-5 Units Subcutaneous QHS   insulin aspart  0-6 Units Subcutaneous TID WC   insulin aspart  8 Units Subcutaneous TID WC   insulin glargine  4 Units Subcutaneous QHS   lipase/protease/amylase  12,000 Units Oral TID AC   metoprolol tartrate  50 mg Oral BID   pravastatin  10 mg Oral q1800   saccharomyces boulardii  250 mg Oral BID    have reviewed scheduled and prn medications.  Physical Exam: General: Lying on bed comfortable, not in distress Heart:RRR, s1s2 nl, no rubs Lungs: Clear bilateral, no wheezing or crackle Abdomen:soft, Non-tender, non-distended Extremities:No LE edema Dialysis Access: Right IJ [facility] Neurology: Alert awake and following commands.  Aaro Meyers Tanna Furry 02/06/2021,9:08 AM  LOS: 4 days

## 2021-02-06 NOTE — Progress Notes (Signed)
Report given to Roselyn Reef and [place].

## 2021-02-06 NOTE — TOC Transition Note (Signed)
Transition of Care (TOC) - CM/SW Discharge Note   Patient Details  Name: Mark FAULKENBERRY Sr. MRN: [id number] Date of Birth: 1940/12/02  [facility]) CM/SW Contact:  Shade Flood, LCSW Phone Number: 02/06/2021, 2:29 PM   Clinical Narrative:     Pt stable for dc today per MD. Damaris Schooner with pt's wife who is requesting possible dc to [facility] instead of returning to [place]. Referred to [place] at [facility] and pt is accepted. They can admit today. DC clinical sent electronically. RN to call report. EMS to transport. Updated pt's insurance.  No other TOC needs for dc.   Final next level of care: [place] Barriers to Discharge: Barriers Resolved   Patient Goals and CMS Choice        Discharge Placement              Patient chooses bed at: [facility] Patient to be transferred to facility by: EMS Name of family member notified: Darlene Patient and family notified of of transfer: 02/06/21  Discharge Plan and Services                                     Social Determinants of Health ([place]) Interventions     Readmission Risk Interventions Readmission Risk Prevention Plan 12/04/2018  Transportation Screening Complete  PCP or Specialist Appt within 3-5 Days (No Data)  [place] or Home Care Consult Complete  Social Work Consult for [place] Planning/Counseling Complete  Palliative Care Screening Not Applicable  Medication Review [employer]) Complete  Some recent data might be hidden

## 2021-02-06 NOTE — Progress Notes (Signed)
Pt foley removed per MD verbal order. Pt tolerated well. Will continue to monitor for urine output.

## 2021-02-06 NOTE — Care Management Important Message (Signed)
Important Message  Patient Details  Name: [name]. MRN: [id number] Date of Birth: July 06, 1941   Medicare Important Message Given:  Yes     Tommy Medal 02/06/2021, 12:08 PM

## 2021-02-06 NOTE — Discharge Summary (Addendum)
Physician Discharge Summary  Robley Matassa RAQ:[id number] DOB: 08/23/1940 DOA: 02/02/2021  PCP: [name], MD  Admit date: 02/02/2021  Discharge date: 02/06/2021  Admitted From:SNF  Disposition:  SNF  Recommendations for Outpatient Follow-up:  Follow up with PCP in 1-2 weeks Follow-up with nephrology as scheduled with Dr. Royce Macadamia on 7/11 at 8 AM to review need for further hemodialysis BMP ordered to be collected on 7/8 UTI treatment completed and no further antibiotics indicated Continue home medications as prior with reduction and insulin dosages as noted below to prevent further hypoglycemic episodes  Home Health: None  Equipment/Devices: None  Discharge Condition:Stable  CODE STATUS: Full  Diet recommendation: Heart Healthy/carb modified  Brief/Interim Summary:  Mark Jumbo Sr. is a 80 y.o. male with a history of type 2 diabetes on insulin, hypertension, end-stage renal disease secondary to diabetic nephropathy, nonalcoholic fatty liver disease, CAD, grade 1 diastolic dysfunction.  He was brought to the ED with altered mentation that was noticed during dialysis.  He was also noted to be hypoglycemic and had a UTI.  Urine culture showing growth of E. coli.  He was treated with Rocephin over a 5-day course with significant improvement in his clinical condition.  His blood glucose has remained stable with decreased doses of insulin as prescribed.  He has been seen by nephrology with apparently no further need for hemodialysis at this time as he maintains great urine output with no significant hyperkalemia or acidosis.  He will need repeat BMP on 7/8 as ordered with follow-up to nephrology on 7/11.  He continues to have a mild component of delirium which will likely improve once he leaves the hospital.  Discharge Diagnoses:  Principal Problem:   Sepsis ([place]) Active Problems:   Essential hypertension, benign   DM type 2 with diabetic peripheral neuropathy (HCC)    Metabolic encephalopathy   ESRD (end stage renal disease) ([place])   Hypoglycemia   Acute lower UTI  Principal discharge diagnosis: Acute metabolic encephalopathy secondary to sepsis with E. coli UTI-resolved.  Discharge Instructions  Discharge Instructions     Diet - low sodium heart healthy   Complete by: As directed    Discharge wound care:   Complete by: As directed    Daily dressing changes.   Increase activity slowly   Complete by: As directed       Allergies as of 02/06/2021       Reactions   Enalapril Maleate Cough        Medication List     STOP taking these medications    Darbepoetin Alfa 150 MCG/0.3ML Sosy injection Commonly known as: ARANESP   insulin regular 100 units/mL injection Commonly known as: NOVOLIN R Replaced by: insulin aspart 100 UNIT/ML injection       TAKE these medications    albuterol 108 (90 Base) MCG/ACT inhaler Commonly known as: VENTOLIN HFA Inhale 2 puffs into the lungs every 4 (four) hours as needed for wheezing or shortness of breath.   aspirin EC 81 MG tablet Take 81 mg by mouth at bedtime.   calcitRIOL 0.25 MCG capsule Commonly known as: ROCALTROL Take 1 capsule (0.25 mcg total) by mouth daily.   diltiazem 180 MG 24 hr capsule Commonly known as: CARDIZEM CD Take 1 capsule (180 mg total) by mouth daily.   ergocalciferol 1.25 MG (50000 UT) capsule Commonly known as: VITAMIN D2 Take 50,000 Units by mouth once a week.   FreeStyle [place] 2 Reader [employer] Use 1 Device as  directed   hydrOXYzine 10 MG tablet Commonly known as: ATARAX/VISTARIL Take 1 tablet (10 mg total) by mouth every 12 (twelve) hours as needed for itching or anxiety.   insulin aspart 100 UNIT/ML injection Commonly known as: novoLOG Inject 8 Units into the skin 3 (three) times daily with meals. Replaces: insulin regular 100 units/mL injection   insulin glargine 100 UNIT/ML injection Commonly known as: LANTUS Inject 0.04 mLs (4 Units total) into the  skin at bedtime. What changed: how much to take   metoprolol tartrate 50 MG tablet Commonly known as: LOPRESSOR Take 1 tablet (50 mg total) by mouth 2 (two) times daily.   multivitamin with minerals Tabs tablet Take 1 tablet by mouth daily.   pravastatin 10 MG tablet Commonly known as: PRAVACHOL TAKE 1 TABLET(10 MG) BY MOUTH DAILY WITH BREAKFAST What changed: See the new instructions.   saccharomyces boulardii 250 MG capsule Commonly known as: FLORASTOR Take 1 capsule (250 mg total) by mouth 2 (two) times daily for 15 days.   Zenpep 10000-32000 units Cpep Generic drug: Pancrelipase (Lip-Prot-Amyl) TAKE 1 CAPSULE BY MOUTH THREE TIMES DAILY BEFORE MEALS What changed: See the new instructions.               Discharge Care Instructions  (From admission, onward)           Start     Ordered   02/06/21 0000  Discharge wound care:       Comments: Daily dressing changes.   02/06/21 1112            Follow-up Information     [name], MD. Schedule an appointment as soon as possible for a visit in 1 week(s).   Specialty: Family Medicine Contact information: [address] [place] [address] [phone]                Allergies  Allergen Reactions   Enalapril Maleate Cough    Consultations: Nephrology   Procedures/Studies: CT Head Wo Contrast  Result Date: 02/02/2021 CLINICAL DATA:  Altered mental status. EXAM: CT HEAD WITHOUT CONTRAST TECHNIQUE: Contiguous axial images were obtained from the base of the skull through the vertex without intravenous contrast. COMPARISON:  Head CT 12/23/2020 FINDINGS: Brain: Brain volume is normal for age. There is moderate chronic small vessel ischemia that is not significantly changed from prior. No intracranial hemorrhage, mass effect, or midline shift. No hydrocephalus. The basilar cisterns are patent. No evidence of territorial infarct or acute ischemia. No extra-axial or intracranial fluid  collection. Vascular: No hyperdense vessel. There is skull base atherosclerosis. Skull: No fracture or focal lesion. Sinuses/Orbits: Mucous retention cyst in the right maxillary sinus. Bilateral cataract resection. Other: Prominent subcutaneous calcifications typical of diabetes. IMPRESSION: 1. No acute intracranial abnormality. 2. Unchanged chronic small vessel ischemia. Electronically Signed   By: Keith Rake M.D.   On: 02/02/2021 16:02   DG Chest Port 1 View  Result Date: 02/02/2021 CLINICAL DATA:  Altered mental status.  Possible sepsis. EXAM: PORTABLE CHEST 1 VIEW COMPARISON:  01/22/2021 and prior radiographs FINDINGS: This is a low volume study with mild bibasilar atelectasis. Cardiomediastinal silhouette is unchanged. A RIGHT IJ central venous catheter with tip overlying the SUPERIOR cavoatrial junction noted. No pleural effusion or pneumothorax noted. IMPRESSION: Low volume study with mild bibasilar atelectasis. Electronically Signed   By: Margarette Canada M.D.   On: 02/02/2021 15:17   DG Chest Port 1 View  Result Date: 01/22/2021 CLINICAL DATA:  Dialysis catheter  placement EXAM: PORTABLE CHEST 1 VIEW COMPARISON:  12/29/2020 FINDINGS: Interval placement of large-bore right neck multi lumen vascular catheter, tip projecting near the superior cavoatrial junction. Unchanged elevation of the right hemidiaphragm. No acute appearing airspace opacity. The heart and mediastinum are unremarkable. IMPRESSION: 1. Interval placement of large-bore right neck multi lumen vascular catheter, tip projecting near the superior cavoatrial junction. 2. Unchanged elevation of the right hemidiaphragm. No acute appearing airspace opacity. Electronically Signed   By: Eddie Candle M.D.   On: 01/22/2021 16:23   DG C-Arm 1-60 Min-No Report  Result Date: 01/22/2021 Fluoroscopy was utilized by the requesting physician.  No radiographic interpretation.     Discharge Exam: Vitals:   02/06/21 0420 02/06/21 1344  BP: 140/74  130/67  Pulse: (!) 102 86  Resp: 18 17  Temp: 98 F (36.7 C) 99 F (37.2 C)  SpO2: 99% 99%   Vitals:   02/05/21 1321 02/05/21 2131 02/06/21 0420 02/06/21 1344  BP: 140/77 139/71 140/74 130/67  Pulse: (!) 106 86 (!) 102 86  Resp: 20 18 18 17   Temp: 98.1 F (36.7 C) 98.2 F (36.8 C) 98 F (36.7 C) 99 F (37.2 C)  TempSrc: Oral Oral Oral Oral  SpO2: 100% 100% 99% 99%  Weight:      Height:        General: Pt is alert, awake, not in acute distress Cardiovascular: RRR, S1/S2 +, no rubs, no gallops Respiratory: CTA bilaterally, no wheezing, no rhonchi Abdominal: Soft, NT, ND, bowel sounds + Extremities: no edema, no cyanosis    The results of significant diagnostics from this hospitalization (including imaging, microbiology, ancillary and laboratory) are listed below for reference.     Microbiology: Recent Results (from the past 240 hour(s))  MRSA Next Gen by PCR, Nasal     Status: None   Collection Time: 02/02/21  2:30 AM   Specimen: Nasal Mucosa; Nasal Swab  Result Value Ref Range Status   MRSA by PCR Next Gen NOT DETECTED NOT DETECTED Final    Comment: (NOTE) The GeneXpert MRSA Assay (FDA approved for NASAL specimens only), is one component of a comprehensive MRSA colonization surveillance program. It is not intended to diagnose MRSA infection nor to guide or monitor treatment for MRSA infections. Test performance is not FDA approved in patients less than 74 years old. Performed at [facility], [address], [place], [place] [address]   Resp Panel by RT-PCR (Flu A&B, Covid) Urine, Catheterized     Status: None   Collection Time: 02/02/21  2:30 PM   Specimen: Urine, Catheterized; Nasopharyngeal(NP) swabs in vial transport medium  Result Value Ref Range Status   SARS Coronavirus 2 by RT PCR NEGATIVE NEGATIVE Final    Comment: (NOTE) SARS-CoV-2 target nucleic acids are NOT DETECTED.  The SARS-CoV-2 RNA is generally detectable in upper respiratory specimens during  the acute phase of infection. The lowest concentration of SARS-CoV-2 viral copies this assay can detect is 138 copies/mL. A negative result does not preclude SARS-Cov-2 infection and should not be used as the sole basis for treatment or other patient management decisions. A negative result may occur with  improper specimen collection/handling, submission of specimen other than nasopharyngeal swab, presence of viral mutation(s) within the areas targeted by this assay, and inadequate number of viral copies(<138 copies/mL). A negative result must be combined with clinical observations, patient history, and epidemiological information. The expected result is Negative.  Fact Sheet for Patients:  EntrepreneurPulse.com.au  Fact Sheet for Healthcare Providers:  IncredibleEmployment.be  This test is no t yet approved or cleared by the [place] and  has been authorized for detection and/or diagnosis of SARS-CoV-2 by FDA under an Emergency Use Authorization (EUA). This EUA will remain  in effect (meaning this test can be used) for the duration of the COVID-19 declaration under Section 564(b)(1) of the Act, 21 U.S.C.section 360bbb-3(b)(1), unless the authorization is terminated  or revoked sooner.       Influenza A by PCR NEGATIVE NEGATIVE Final   Influenza B by PCR NEGATIVE NEGATIVE Final    Comment: (NOTE) The Xpert Xpress SARS-CoV-2/FLU/RSV plus assay is intended as an aid in the diagnosis of influenza from Nasopharyngeal swab specimens and should not be used as a sole basis for treatment. Nasal washings and aspirates are unacceptable for Xpert Xpress SARS-CoV-2/FLU/RSV testing.  Fact Sheet for Patients: EntrepreneurPulse.com.au  Fact Sheet for Healthcare Providers: IncredibleEmployment.be  This test is not yet approved or cleared by the [place] FDA and has been authorized for detection and/or  diagnosis of SARS-CoV-2 by FDA under an Emergency Use Authorization (EUA). This EUA will remain in effect (meaning this test can be used) for the duration of the COVID-19 declaration under Section 564(b)(1) of the Act, 21 U.S.C. section 360bbb-3(b)(1), unless the authorization is terminated or revoked.  Performed at [facility], [address] [place], [place] [address]   Blood Culture (routine x 2)     Status: None (Preliminary result)   Collection Time: 02/02/21  2:36 PM   Specimen: BLOOD  Result Value Ref Range Status   Specimen Description BLOOD LEFT ARM  Final   Special Requests   Final    BOTTLES DRAWN AEROBIC AND ANAEROBIC Blood Culture adequate volume   Culture   Final    NO GROWTH 2 DAYS Performed at [facility], [address], [place], [place] [address]    Report Status PENDING  Incomplete  Blood Culture (routine x 2)     Status: None (Preliminary result)   Collection Time: 02/02/21  2:36 PM   Specimen: BLOOD  Result Value Ref Range Status   Specimen Description BLOOD RIGHT HAND  Final   Special Requests   Final    BOTTLES DRAWN AEROBIC AND ANAEROBIC Blood Culture results may not be optimal due to an inadequate volume of blood received in culture bottles   Culture   Final    NO GROWTH 2 DAYS Performed at [facility], [address], [place], [place] [address]    Report Status PENDING  Incomplete  Urine culture     Status: Abnormal   Collection Time: 02/02/21  2:50 PM   Specimen: Urine, Catheterized  Result Value Ref Range Status   Specimen Description   Final    URINE, CATHETERIZED Performed at [facility], [address] [place], [place] [address]    Special Requests   Final    NONE Performed at [facility], [address], [place], [place] [address]    Culture >=100,000 COLONIES/mL ESCHERICHIA COLI (A)  Final   Report Status 02/05/2021 FINAL  Final   Organism ID, Bacteria ESCHERICHIA COLI (A)  Final      Susceptibility   Escherichia coli - MIC*     AMPICILLIN >=32 RESISTANT Resistant     CEFAZOLIN <=4 SENSITIVE Sensitive     CEFEPIME <=0.12 SENSITIVE Sensitive     CEFTRIAXONE <=0.25 SENSITIVE Sensitive     CIPROFLOXACIN <=0.25 SENSITIVE Sensitive     GENTAMICIN >=16 RESISTANT Resistant  IMIPENEM <=0.25 SENSITIVE Sensitive     NITROFURANTOIN <=16 SENSITIVE Sensitive     TRIMETH/SULFA >=320 RESISTANT Resistant     AMPICILLIN/SULBACTAM 16 INTERMEDIATE Intermediate     PIP/TAZO <=4 SENSITIVE Sensitive     * >=100,000 COLONIES/mL ESCHERICHIA COLI  Resp Panel by RT-PCR (Flu A&B, Covid) Nasopharyngeal Swab     Status: None   Collection Time: 02/06/21 11:59 AM   Specimen: Nasopharyngeal Swab; Nasopharyngeal(NP) swabs in vial transport medium  Result Value Ref Range Status   SARS Coronavirus 2 by RT PCR NEGATIVE NEGATIVE Final    Comment: (NOTE) SARS-CoV-2 target nucleic acids are NOT DETECTED.  The SARS-CoV-2 RNA is generally detectable in upper respiratory specimens during the acute phase of infection. The lowest concentration of SARS-CoV-2 viral copies this assay can detect is 138 copies/mL. A negative result does not preclude SARS-Cov-2 infection and should not be used as the sole basis for treatment or other patient management decisions. A negative result may occur with  improper specimen collection/handling, submission of specimen other than nasopharyngeal swab, presence of viral mutation(s) within the areas targeted by this assay, and inadequate number of viral copies(<138 copies/mL). A negative result must be combined with clinical observations, patient history, and epidemiological information. The expected result is Negative.  Fact Sheet for Patients:  EntrepreneurPulse.com.au  Fact Sheet for Healthcare Providers:  IncredibleEmployment.be  This test is no t yet approved or cleared by the [place] FDA and  has been authorized for detection and/or diagnosis of SARS-CoV-2 by FDA  under an Emergency Use Authorization (EUA). This EUA will remain  in effect (meaning this test can be used) for the duration of the COVID-19 declaration under Section 564(b)(1) of the Act, 21 U.S.C.section 360bbb-3(b)(1), unless the authorization is terminated  or revoked sooner.       Influenza A by PCR NEGATIVE NEGATIVE Final   Influenza B by PCR NEGATIVE NEGATIVE Final    Comment: (NOTE) The Xpert Xpress SARS-CoV-2/FLU/RSV plus assay is intended as an aid in the diagnosis of influenza from Nasopharyngeal swab specimens and should not be used as a sole basis for treatment. Nasal washings and aspirates are unacceptable for Xpert Xpress SARS-CoV-2/FLU/RSV testing.  Fact Sheet for Patients: EntrepreneurPulse.com.au  Fact Sheet for Healthcare Providers: IncredibleEmployment.be  This test is not yet approved or cleared by the [place] FDA and has been authorized for detection and/or diagnosis of SARS-CoV-2 by FDA under an Emergency Use Authorization (EUA). This EUA will remain in effect (meaning this test can be used) for the duration of the COVID-19 declaration under Section 564(b)(1) of the Act, 21 U.S.C. section 360bbb-3(b)(1), unless the authorization is terminated or revoked.  Performed at [facility], [address], [place], [place] [address]      Labs: BNP (last 3 results) No results for input(s): BNP in the last 8760 hours. Basic Metabolic Panel: Recent Labs  Lab 02/02/21 1420 02/03/21 0513 02/04/21 0433 02/05/21 0615 02/06/21 0649  NA 136 137 136 135 138  K 3.1* 3.1* 3.0* 3.0* 3.7  CL 99 100 102 102 103  CO2 25 26 23  21* 20*  GLUCOSE 52* 153* 188* 130* 137*  BUN 27* 32* 39* 48* 53*  CREATININE 2.49* 2.65* 2.85* 2.96*  3.10* 3.03*  CALCIUM 6.8* 6.7* 6.6* 6.8* 7.7*  MG  --   --  1.2* 1.5* 1.7  PHOS  --   --   --  4.0  --    Liver Function Tests: Recent Labs  Lab 02/02/21 1420 02/05/21  0615  AST 17  --   ALT  14  --   ALKPHOS 176*  --   BILITOT 0.5  --   PROT 6.2*  --   ALBUMIN 2.5* 1.8*   No results for input(s): LIPASE, AMYLASE in the last 168 hours. No results for input(s): AMMONIA in the last 168 hours. CBC: Recent Labs  Lab 02/02/21 1420 02/03/21 0513 02/04/21 0433 02/05/21 0615  WBC 17.4* 12.6* 13.3* 9.3  NEUTROABS 16.2*  --   --  7.6  HGB 9.0* 8.1* 7.5* 8.1*  HCT 29.6* 27.6* 25.7* 27.4*  MCV 99.7 101.1* 101.2* 100.4*  PLT 204 173 171 194   Cardiac Enzymes: No results for input(s): CKTOTAL, CKMB, CKMBINDEX, TROPONINI in the last 168 hours. BNP: Invalid input(s): POCBNP CBG: Recent Labs  Lab 02/05/21 1612 02/05/21 2121 02/05/21 2151 02/06/21 0727 02/06/21 1119  GLUCAP 140* 67* 120* 145* 207*   D-Dimer No results for input(s): DDIMER in the last 72 hours. Hgb A1c No results for input(s): HGBA1C in the last 72 hours. Lipid Profile No results for input(s): CHOL, HDL, LDLCALC, TRIG, CHOLHDL, LDLDIRECT in the last 72 hours. Thyroid function studies No results for input(s): TSH, T4TOTAL, T3FREE, THYROIDAB in the last 72 hours.  Invalid input(s): FREET3 Anemia work up No results for input(s): VITAMINB12, FOLATE, FERRITIN, TIBC, IRON, RETICCTPCT in the last 72 hours. Urinalysis    Component Value Date/Time   COLORURINE YELLOW 02/02/2021 1450   APPEARANCEUR TURBID (A) 02/02/2021 1450   LABSPEC 1.009 02/02/2021 1450   PHURINE 5.0 02/02/2021 1450   GLUCOSEU NEGATIVE 02/02/2021 1450   HGBUR MODERATE (A) 02/02/2021 1450   BILIRUBINUR NEGATIVE 02/02/2021 1450   KETONESUR NEGATIVE 02/02/2021 1450   PROTEINUR 100 (A) 02/02/2021 1450   UROBILINOGEN 0.2 03/20/2013 2016   NITRITE NEGATIVE 02/02/2021 1450   LEUKOCYTESUR LARGE (A) 02/02/2021 1450   Sepsis Labs Invalid input(s): PROCALCITONIN,  WBC,  LACTICIDVEN Microbiology Recent Results (from the past 240 hour(s))  MRSA Next Gen by PCR, Nasal     Status: None   Collection Time: 02/02/21  2:30 AM   Specimen: Nasal  Mucosa; Nasal Swab  Result Value Ref Range Status   MRSA by PCR Next Gen NOT DETECTED NOT DETECTED Final    Comment: (NOTE) The GeneXpert MRSA Assay (FDA approved for NASAL specimens only), is one component of a comprehensive MRSA colonization surveillance program. It is not intended to diagnose MRSA infection nor to guide or monitor treatment for MRSA infections. Test performance is not FDA approved in patients less than 57 years old. Performed at [facility], [address] [place], [place] [address]   Resp Panel by RT-PCR (Flu A&B, Covid) Urine, Catheterized     Status: None   Collection Time: 02/02/21  2:30 PM   Specimen: Urine, Catheterized; Nasopharyngeal(NP) swabs in vial transport medium  Result Value Ref Range Status   SARS Coronavirus 2 by RT PCR NEGATIVE NEGATIVE Final    Comment: (NOTE) SARS-CoV-2 target nucleic acids are NOT DETECTED.  The SARS-CoV-2 RNA is generally detectable in upper respiratory specimens during the acute phase of infection. The lowest concentration of SARS-CoV-2 viral copies this assay can detect is 138 copies/mL. A negative result does not preclude SARS-Cov-2 infection and should not be used as the sole basis for treatment or other patient management decisions. A negative result may occur with  improper specimen collection/handling, submission of specimen other than nasopharyngeal swab, presence of viral mutation(s) within the areas targeted by this assay, and inadequate  number of viral copies(<138 copies/mL). A negative result must be combined with clinical observations, patient history, and epidemiological information. The expected result is Negative.  Fact Sheet for Patients:  EntrepreneurPulse.com.au  Fact Sheet for Healthcare Providers:  IncredibleEmployment.be  This test is no t yet approved or cleared by the [place] FDA and  has been authorized for detection and/or diagnosis of SARS-CoV-2  by FDA under an Emergency Use Authorization (EUA). This EUA will remain  in effect (meaning this test can be used) for the duration of the COVID-19 declaration under Section 564(b)(1) of the Act, 21 U.S.C.section 360bbb-3(b)(1), unless the authorization is terminated  or revoked sooner.       Influenza A by PCR NEGATIVE NEGATIVE Final   Influenza B by PCR NEGATIVE NEGATIVE Final    Comment: (NOTE) The Xpert Xpress SARS-CoV-2/FLU/RSV plus assay is intended as an aid in the diagnosis of influenza from Nasopharyngeal swab specimens and should not be used as a sole basis for treatment. Nasal washings and aspirates are unacceptable for Xpert Xpress SARS-CoV-2/FLU/RSV testing.  Fact Sheet for Patients: EntrepreneurPulse.com.au  Fact Sheet for Healthcare Providers: IncredibleEmployment.be  This test is not yet approved or cleared by the [place] FDA and has been authorized for detection and/or diagnosis of SARS-CoV-2 by FDA under an Emergency Use Authorization (EUA). This EUA will remain in effect (meaning this test can be used) for the duration of the COVID-19 declaration under Section 564(b)(1) of the Act, 21 U.S.C. section 360bbb-3(b)(1), unless the authorization is terminated or revoked.  Performed at [facility], [address], [place], [place] [address]   Blood Culture (routine x 2)     Status: None (Preliminary result)   Collection Time: 02/02/21  2:36 PM   Specimen: BLOOD  Result Value Ref Range Status   Specimen Description BLOOD LEFT ARM  Final   Special Requests   Final    BOTTLES DRAWN AEROBIC AND ANAEROBIC Blood Culture adequate volume   Culture   Final    NO GROWTH 2 DAYS Performed at [facility], [address] [place], [place] [address]    Report Status PENDING  Incomplete  Blood Culture (routine x 2)     Status: None (Preliminary result)   Collection Time: 02/02/21  2:36 PM   Specimen: BLOOD  Result Value Ref Range  Status   Specimen Description BLOOD RIGHT HAND  Final   Special Requests   Final    BOTTLES DRAWN AEROBIC AND ANAEROBIC Blood Culture results may not be optimal due to an inadequate volume of blood received in culture bottles   Culture   Final    NO GROWTH 2 DAYS Performed at [facility], [address], [place], [place] [address]    Report Status PENDING  Incomplete  Urine culture     Status: Abnormal   Collection Time: 02/02/21  2:50 PM   Specimen: Urine, Catheterized  Result Value Ref Range Status   Specimen Description   Final    URINE, CATHETERIZED Performed at [facility], [address], [place], [place] [address]    Special Requests   Final    NONE Performed at [facility], [address] [place], [place] [address]    Culture >=100,000 COLONIES/mL ESCHERICHIA COLI (A)  Final   Report Status 02/05/2021 FINAL  Final   Organism ID, Bacteria ESCHERICHIA COLI (A)  Final      Susceptibility   Escherichia coli - MIC*    AMPICILLIN >=32 RESISTANT Resistant  CEFAZOLIN <=4 SENSITIVE Sensitive     CEFEPIME <=0.12 SENSITIVE Sensitive     CEFTRIAXONE <=0.25 SENSITIVE Sensitive     CIPROFLOXACIN <=0.25 SENSITIVE Sensitive     GENTAMICIN >=16 RESISTANT Resistant     IMIPENEM <=0.25 SENSITIVE Sensitive     NITROFURANTOIN <=16 SENSITIVE Sensitive     TRIMETH/SULFA >=320 RESISTANT Resistant     AMPICILLIN/SULBACTAM 16 INTERMEDIATE Intermediate     PIP/TAZO <=4 SENSITIVE Sensitive     * >=100,000 COLONIES/mL ESCHERICHIA COLI  Resp Panel by RT-PCR (Flu A&B, Covid) Nasopharyngeal Swab     Status: None   Collection Time: 02/06/21 11:59 AM   Specimen: Nasopharyngeal Swab; Nasopharyngeal(NP) swabs in vial transport medium  Result Value Ref Range Status   SARS Coronavirus 2 by RT PCR NEGATIVE NEGATIVE Final    Comment: (NOTE) SARS-CoV-2 target nucleic acids are NOT DETECTED.  The SARS-CoV-2 RNA is generally detectable in upper respiratory specimens during the acute phase of  infection. The lowest concentration of SARS-CoV-2 viral copies this assay can detect is 138 copies/mL. A negative result does not preclude SARS-Cov-2 infection and should not be used as the sole basis for treatment or other patient management decisions. A negative result may occur with  improper specimen collection/handling, submission of specimen other than nasopharyngeal swab, presence of viral mutation(s) within the areas targeted by this assay, and inadequate number of viral copies(<138 copies/mL). A negative result must be combined with clinical observations, patient history, and epidemiological information. The expected result is Negative.  Fact Sheet for Patients:  EntrepreneurPulse.com.au  Fact Sheet for Healthcare Providers:  IncredibleEmployment.be  This test is no t yet approved or cleared by the [place] FDA and  has been authorized for detection and/or diagnosis of SARS-CoV-2 by FDA under an Emergency Use Authorization (EUA). This EUA will remain  in effect (meaning this test can be used) for the duration of the COVID-19 declaration under Section 564(b)(1) of the Act, 21 U.S.C.section 360bbb-3(b)(1), unless the authorization is terminated  or revoked sooner.       Influenza A by PCR NEGATIVE NEGATIVE Final   Influenza B by PCR NEGATIVE NEGATIVE Final    Comment: (NOTE) The Xpert Xpress SARS-CoV-2/FLU/RSV plus assay is intended as an aid in the diagnosis of influenza from Nasopharyngeal swab specimens and should not be used as a sole basis for treatment. Nasal washings and aspirates are unacceptable for Xpert Xpress SARS-CoV-2/FLU/RSV testing.  Fact Sheet for Patients: EntrepreneurPulse.com.au  Fact Sheet for Healthcare Providers: IncredibleEmployment.be  This test is not yet approved or cleared by the [place] FDA and has been authorized for detection and/or diagnosis of SARS-CoV-2  by FDA under an Emergency Use Authorization (EUA). This EUA will remain in effect (meaning this test can be used) for the duration of the COVID-19 declaration under Section 564(b)(1) of the Act, 21 U.S.C. section 360bbb-3(b)(1), unless the authorization is terminated or revoked.  Performed at [facility], [address], [place], [place] [address]      Time coordinating discharge: 35 minutes  SIGNED:   Rodena Goldmann, DO [facility] 02/06/2021, 1:47 PM  If 7PM-7AM, please contact night-coverage [email or web address]

## 2021-02-07 LAB — CULTURE, BLOOD (ROUTINE X 2)
Culture: NO GROWTH
Culture: NO GROWTH
Special Requests: ADEQUATE

## 2021-02-08 DIAGNOSIS — I2510 Atherosclerotic heart disease of native coronary artery without angina pectoris: Secondary | ICD-10-CM | POA: Diagnosis not present

## 2021-02-08 DIAGNOSIS — K8689 Other specified diseases of pancreas: Secondary | ICD-10-CM | POA: Diagnosis not present

## 2021-02-08 DIAGNOSIS — K760 Fatty (change of) liver, not elsewhere classified: Secondary | ICD-10-CM | POA: Diagnosis not present

## 2021-02-08 DIAGNOSIS — G9341 Metabolic encephalopathy: Secondary | ICD-10-CM | POA: Diagnosis not present

## 2021-02-08 DIAGNOSIS — I10 Essential (primary) hypertension: Secondary | ICD-10-CM | POA: Diagnosis not present

## 2021-02-08 DIAGNOSIS — A419 Sepsis, unspecified organism: Secondary | ICD-10-CM | POA: Diagnosis not present

## 2021-02-08 DIAGNOSIS — E119 Type 2 diabetes mellitus without complications: Secondary | ICD-10-CM | POA: Diagnosis not present

## 2021-02-08 DIAGNOSIS — N186 End stage renal disease: Secondary | ICD-10-CM | POA: Diagnosis not present

## 2021-02-10 DIAGNOSIS — E7220 Disorder of urea cycle metabolism, unspecified: Secondary | ICD-10-CM | POA: Diagnosis not present

## 2021-02-10 DIAGNOSIS — J9811 Atelectasis: Secondary | ICD-10-CM | POA: Diagnosis not present

## 2021-02-13 DIAGNOSIS — D631 Anemia in chronic kidney disease: Secondary | ICD-10-CM | POA: Diagnosis not present

## 2021-02-13 DIAGNOSIS — N2581 Secondary hyperparathyroidism of renal origin: Secondary | ICD-10-CM | POA: Diagnosis not present

## 2021-02-13 DIAGNOSIS — N184 Chronic kidney disease, stage 4 (severe): Secondary | ICD-10-CM | POA: Diagnosis not present

## 2021-02-13 DIAGNOSIS — E119 Type 2 diabetes mellitus without complications: Secondary | ICD-10-CM | POA: Diagnosis not present

## 2021-02-13 DIAGNOSIS — I129 Hypertensive chronic kidney disease with stage 1 through stage 4 chronic kidney disease, or unspecified chronic kidney disease: Secondary | ICD-10-CM | POA: Diagnosis not present

## 2021-02-13 DIAGNOSIS — N179 Acute kidney failure, unspecified: Secondary | ICD-10-CM | POA: Diagnosis not present

## 2021-02-14 DIAGNOSIS — L089 Local infection of the skin and subcutaneous tissue, unspecified: Secondary | ICD-10-CM | POA: Diagnosis not present

## 2021-02-14 NOTE — Progress Notes (Deleted)
Cardiology Office Note    Date:  02/14/2021   ID:  Mark Jumbo Sr., DOB Aug 06, 1940, MRN [id number]   PCP:  Mark Frizzle, MD   [place]  Cardiologist:  Mark Lesches, MD *** Advanced Practice Provider:  No care team member to display Electrophysiologist:  None   [phone]   No chief complaint on file.   History of Present Illness:  [name]. is a 80 y.o. male with past medical history of CAD (coronary calcifications by prior CT with low-risk NST in 2013 - no known intervention), HTN, HLD, Type 2 DM and Stage 3 CKD   Patient seen in the hospital by AS 01/10/2021 for concerns of atrial fibrillation although not definitive but definitely had NSVT all in the setting of COVID-19 the month prior uncontrolled diabetes, metabolic encephalopathy and AKI.  Echo showed normal LVEF 60 to 65% no wall motion abnormalities.  No clear indication for long-term anticoagulation.  Diltiazem was increased to 180 mg daily and he was continued on metoprolol.  Discharged 02/06/21 after admission with sepsis secondary to UTI.  Past Medical History:  Diagnosis Date   Arthritis    lower back   Chronic back pain    Disc disease   Chronic kidney disease    Coronary atherosclerosis of native coronary artery    Coronary calcifications by chest CT, Myoview demonstrating inferolateral scar   Deafness in right ear    Diabetes mellitus    Diabetic retinopathy (HCC)    moderate nonproliferative with macular edema in right eye   Diastolic dysfunction 08/5724   Grade 1.   Essential hypertension, benign    Heart murmur    in past   HOH (hard of hearing)    Hyperkalemia    Kidney stones    Mixed hyperlipidemia    NAFLD (nonalcoholic fatty liver disease)    Pancreatic insufficiency    Diagnosed at [facility]   Prostate cancer [facility]) 2011   Shortness of breath dyspnea    occasional - liver pressing on right lung, decreased capacity   Subdural hematoma (HCC)     Type 2 diabetes mellitus ([place])    Wears hearing aid    "crossover" form right to left    Past Surgical History:  Procedure Laterality Date   APPENDECTOMY     Bilateral hip replacement     CATARACT EXTRACTION W/PHACO Left 05/15/2015   Procedure: CATARACT EXTRACTION PHACO AND INTRAOCULAR LENS PLACEMENT ([place]);  Surgeon: Ronnell Freshwater, MD;  Location: [place];  Service: Ophthalmology;  Laterality: Left;  DIABETIC - insulin pump and oral meds   CATARACT EXTRACTION W/PHACO Right 08/28/2015   Procedure: CATARACT EXTRACTION PHACO AND INTRAOCULAR LENS PLACEMENT ([facility]);  Surgeon: Ronnell Freshwater, MD;  Location: [place];  Service: Ophthalmology;  Laterality: Right;  DIABETIC PER PT AND CINDY PLEASE KEEP ARRIVAL TIME AFTER 8AM    CHOLECYSTECTOMY     HERNIA REPAIR     INSERTION OF DIALYSIS CATHETER Right 01/22/2021   Procedure: INSERTION OF DIALYSIS CATHETER;  Surgeon: Mark Cagey, MD;  Location: [facility];  Service: General;  Laterality: Right;   PROSTATECTOMY  2011    Current Medications: No outpatient medications have been marked as taking for the 02/19/21 encounter (Appointment) with Mark Burn, PA-C.     Allergies:   Enalapril maleate   Social History   Socioeconomic History   Marital status: Married    Spouse name: Not on file  Number of children: Not on file   Years of education: Not on file   Highest education level: Not on file  Occupational History   Not on file  Tobacco Use   Smoking status: Never   Smokeless tobacco: Never  Substance and Sexual Activity   Alcohol use: No   Drug use: No   Sexual activity: Not on file  Other Topics Concern   Not on file  Social History Narrative   Not on file   Social Determinants of Health   Financial Resource Strain: Not on file  Food Insecurity: Not on file  Transportation Needs: Not on file  Physical Activity: Not on file  Stress: Not on file  Social Connections: Not on file      Family History:  The patient's ***family history includes Diabetes type II in his mother; Heart attack in his father.   ROS:   Please see the history of present illness.    ROS All other systems reviewed and are negative.   PHYSICAL EXAM:   VS:  There were no vitals taken for this visit.  Physical Exam  GEN: Well nourished, well developed, in no acute distress  HEENT: normal  Neck: no JVD, carotid bruits, or masses Cardiac:RRR; no murmurs, rubs, or gallops  Respiratory:  clear to auscultation bilaterally, normal work of breathing GI: soft, nontender, nondistended, + BS Ext: without cyanosis, clubbing, or edema, Good distal pulses bilaterally MS: no deformity or atrophy  Skin: warm and dry, no rash Neuro:  Alert and Oriented x 3, Strength and sensation are intact Psych: euthymic mood, full affect  Wt Readings from Last 3 Encounters:  02/03/21 170 lb 13.7 oz (77.5 kg)  01/22/21 192 lb 14.4 oz (87.5 kg)  12/21/20 192 lb (87.1 kg)      Studies/Labs Reviewed:   EKG:  EKG is*** ordered today.  The ekg ordered today demonstrates ***  Recent Labs: 01/04/2021: TSH 1.477 02/02/2021: ALT 14 02/05/2021: Hemoglobin 8.1; Platelets 194 02/06/2021: BUN 53; Creatinine, Ser 3.03; Magnesium 1.7; Potassium 3.7; Sodium 138   Lipid Panel    Component Value Date/Time   CHOL 103 07/20/2019 0938   TRIG 81 07/20/2019 0938   HDL 38 (L) 07/20/2019 0938   CHOLHDL 2.7 07/20/2019 0938   VLDL 25.2 02/22/2016 1522   LDLCALC 49 07/20/2019 0938   LDLDIRECT 54 05/27/2016 1421    Additional studies/ records that were reviewed today include:  NST: 12/2011 IMPRESSION:  Low risk Lexiscan Myoview as outlined.  There were no diagnostic ST-  segment changes.  Occasional PVCs noted without sustained  arrhythmia.  Perfusion imaging shows a small inferior lateral  defect at the apex, possibly related to diaphragmatic attenuation  although cannot entirely exclude scar.  No large degree of ischemia  noted  with LVEF of 64%.   Echocardiogram: 12/2020 IMPRESSIONS     1. Left ventricular ejection fraction, by estimation, is 60 to 65%. The  left ventricle has normal function. The left ventricle has no regional  wall motion abnormalities. There is mild left ventricular hypertrophy.  Left ventricular diastolic parameters  are consistent with Grade I diastolic dysfunction (impaired relaxation).   2. Right ventricular systolic function is normal. The right ventricular  size is normal. There is normal pulmonary artery systolic pressure.   3. The mitral valve is normal in structure. No evidence of mitral valve  regurgitation. No evidence of mitral stenosis.   4. The aortic valve is tricuspid. Aortic valve regurgitation is not  visualized. Mild  aortic valve sclerosis is present, with no evidence of  aortic valve stenosis.   5. Aortic dilatation noted. There is mild dilatation of the aortic root,  measuring 39 mm.   6. The inferior vena cava is normal in size with greater than 50%  respiratory variability, suggesting right atrial pressure of 3 mmHg.     Risk Assessment/Calculations:   {Does this patient have ATRIAL FIBRILLATION?:[phone]}     ASSESSMENT:    No diagnosis found.   PLAN:  In order of problems listed above:  CAD- Documented as having coronary calcifications by prior CT with low-risk NST in 2013.  SVT when hospitalized after COVID with AKI and metabolic encephalopathy uncontrolled diabetes mellitus  Essential hypertension  CKD stage 4 Shared Decision Making/Informed Consent   {Are you ordering a CV Procedure (e.g. stress test, cath, DCCV, TEE, etc)?   Press F2        :[id number]}    Medication Adjustments/Labs and Tests Ordered: Current medicines are reviewed at length with the patient today.  Concerns regarding medicines are outlined above.  Medication changes, Labs and Tests ordered today are listed in the Patient Instructions below. There are no Patient  Instructions on file for this visit.   Sumner Boast, PA-C  02/14/2021 3:42 PM    [facility] [place], [place], [place]  [address] Phone: [phone]; Fax: [phone]

## 2021-02-15 DIAGNOSIS — E119 Type 2 diabetes mellitus without complications: Secondary | ICD-10-CM | POA: Diagnosis not present

## 2021-02-19 ENCOUNTER — Ambulatory Visit: Payer: PPO | Admitting: Physician Assistant

## 2021-02-20 DIAGNOSIS — N184 Chronic kidney disease, stage 4 (severe): Secondary | ICD-10-CM | POA: Diagnosis not present

## 2021-02-20 DIAGNOSIS — E119 Type 2 diabetes mellitus without complications: Secondary | ICD-10-CM | POA: Diagnosis not present

## 2021-02-21 ENCOUNTER — Ambulatory Visit: Payer: PPO | Admitting: Podiatry

## 2021-02-22 ENCOUNTER — Other Ambulatory Visit: Payer: Self-pay

## 2021-02-22 ENCOUNTER — Ambulatory Visit (INDEPENDENT_AMBULATORY_CARE_PROVIDER_SITE_OTHER): Payer: PPO | Admitting: Student

## 2021-02-22 ENCOUNTER — Encounter: Payer: Self-pay | Admitting: Student

## 2021-02-22 VITALS — BP 118/66 | HR 98 | Ht 68.0 in | Wt 163.0 lb

## 2021-02-22 DIAGNOSIS — T8130XA Disruption of wound, unspecified, initial encounter: Secondary | ICD-10-CM | POA: Diagnosis not present

## 2021-02-22 DIAGNOSIS — R002 Palpitations: Secondary | ICD-10-CM | POA: Diagnosis not present

## 2021-02-22 DIAGNOSIS — I471 Supraventricular tachycardia: Secondary | ICD-10-CM | POA: Diagnosis not present

## 2021-02-22 DIAGNOSIS — I2510 Atherosclerotic heart disease of native coronary artery without angina pectoris: Secondary | ICD-10-CM | POA: Diagnosis not present

## 2021-02-22 DIAGNOSIS — N179 Acute kidney failure, unspecified: Secondary | ICD-10-CM | POA: Diagnosis not present

## 2021-02-22 DIAGNOSIS — I77810 Thoracic aortic ectasia: Secondary | ICD-10-CM | POA: Diagnosis not present

## 2021-02-22 DIAGNOSIS — I10 Essential (primary) hypertension: Secondary | ICD-10-CM | POA: Diagnosis not present

## 2021-02-22 MED ORDER — DILTIAZEM HCL ER COATED BEADS 240 MG PO CP24: 240.0000 mg | ORAL_CAPSULE | Freq: Every day | ORAL | 3 refills | Status: DC

## 2021-02-22 NOTE — Progress Notes (Signed)
Cardiology Office Note    Date:  02/22/2021   ID:  Mark Jumbo Sr., DOB [birth year], MRN [id number]  PCP:  [name], MD  Cardiologist: Rozann Lesches, MD    Chief Complaint  Patient presents with   Hospitalization Follow-up    History of Present Illness:    Mark Jumbo Sr. is a 80 y.o. male with past medical history of CAD (coronary calcifications by prior CT with low-risk NST in 2013 - no known intervention), HTN, HLD, Type 2 DM and Stage 3 CKD who presents to the office today for hospital follow-up.  He was admitted to [facility] in 12/2020 for evaluation of worsening altered mental status and decreased oral intake and found to have severe metabolic acidosis in the setting of an AKI and COVID-19. An echocardiogram was performed and showed a preserved EF of 60 to 65% with no regional wall motion abnormalities. He did have episodes of SVT during admission and questionable bursts of atrial fibrillation but no definitive AF. At that time, it was recommended to continue with observation as there was no clear indication for long-term anticoagulation. He was started on Cardizem CD 180 mg daily along with Lopressor 50 mg twice daily. He did require initiation of dialysis during admission which was managed by Nephrology.  He was admitted again earlier this month for an episode of unresponsiveness and was hypoglycemic with glucose of 36 upon EMS arrival. He was also found to have a UTI during admission which was treated with Rocephin. Was discharged to the [facility] in [place].   In talking with the patient and his wife today, he reports he is now working with PT at rehab and is starting to make some progress. He is hopeful to return home by his 80th birthday. He does report continuing to have intermittent episodes of palpitations and this is typically most notable when working with PT. Says his heart rate went up into the 140's earlier this week but improved with rest. He does  report occasional dizziness with positional changes but denies any recent presyncope. No recent orthopnea, PND or pitting edema. He has not required dialysis in several weeks and is being followed closely by Nephrology.   Past Medical History:  Diagnosis Date   Arthritis    lower back   Chronic back pain    Disc disease   Chronic kidney disease    Coronary atherosclerosis of native coronary artery    Coronary calcifications by chest CT, Myoview demonstrating inferolateral scar   Deafness in right ear    Diabetes mellitus    Diabetic retinopathy (HCC)    moderate nonproliferative with macular edema in right eye   Diastolic dysfunction 02/6545   Grade 1.   Essential hypertension, benign    Heart murmur    in past   HOH (hard of hearing)    Hyperkalemia    Kidney stones    Mixed hyperlipidemia    NAFLD (nonalcoholic fatty liver disease)    Pancreatic insufficiency    Diagnosed at [facility]   Prostate cancer [facility]) 2011   Shortness of breath dyspnea    occasional - liver pressing on right lung, decreased capacity   Subdural hematoma (HCC)    Type 2 diabetes mellitus ([place])    Wears hearing aid    "crossover" form right to left    Past Surgical History:  Procedure Laterality Date   APPENDECTOMY     Bilateral hip replacement     CATARACT EXTRACTION  W/PHACO Left 05/15/2015   Procedure: CATARACT EXTRACTION PHACO AND INTRAOCULAR LENS PLACEMENT (IOC);  Surgeon: Ronnell Freshwater, MD;  Location: [facility];  Service: Ophthalmology;  Laterality: Left;  DIABETIC - insulin pump and oral meds   CATARACT EXTRACTION W/PHACO Right 08/28/2015   Procedure: CATARACT EXTRACTION PHACO AND INTRAOCULAR LENS PLACEMENT (IOC);  Surgeon: Ronnell Freshwater, MD;  Location: [place];  Service: Ophthalmology;  Laterality: Right;  DIABETIC PER PT AND CINDY PLEASE KEEP ARRIVAL TIME AFTER 8AM    CHOLECYSTECTOMY     HERNIA REPAIR     INSERTION OF DIALYSIS CATHETER Right  01/22/2021   Procedure: INSERTION OF DIALYSIS CATHETER;  Surgeon: Virl Cagey, MD;  Location: [facility];  Service: General;  Laterality: Right;   PROSTATECTOMY  2011    Current Medications: Outpatient Medications Prior to Visit  Medication Sig Dispense Refill   albuterol (PROVENTIL HFA;VENTOLIN HFA) 108 (90 Base) MCG/ACT inhaler Inhale 2 puffs into the lungs every 4 (four) hours as needed for wheezing or shortness of breath. 1 Inhaler 0   aspirin EC 81 MG tablet Take 81 mg by mouth at bedtime.      calcitRIOL (ROCALTROL) 0.25 MCG capsule Take 1 capsule (0.25 mcg total) by mouth daily.     Continuous Blood Gluc Receiver (FREESTYLE LIBRE 2 READER) DEVI Use 1 Device as directed     ergocalciferol (VITAMIN D2) 1.25 MG (50000 UT) capsule Take 50,000 Units by mouth once a week.     hydrOXYzine (ATARAX/VISTARIL) 10 MG tablet Take 1 tablet (10 mg total) by mouth every 12 (twelve) hours as needed for itching or anxiety. 20 tablet 0   insulin aspart (NOVOLOG) 100 UNIT/ML injection Inject 8 Units into the skin 3 (three) times daily with meals. 10 mL 11   insulin glargine (LANTUS) 100 UNIT/ML injection Inject 0.04 mLs (4 Units total) into the skin at bedtime. 10 mL 11   levofloxacin (LEVAQUIN) 250 MG tablet Take by mouth.     metoprolol tartrate (LOPRESSOR) 50 MG tablet Take 1 tablet (50 mg total) by mouth 2 (two) times daily.     Multiple Vitamin (MULTIVITAMIN WITH MINERALS) TABS Take 1 tablet by mouth daily.     pravastatin (PRAVACHOL) 10 MG tablet TAKE 1 TABLET(10 MG) BY MOUTH DAILY WITH BREAKFAST 90 tablet 3   sodium bicarbonate 650 MG tablet Take by mouth.     ZENPEP 10000-32000 units CPEP TAKE 1 CAPSULE BY MOUTH THREE TIMES DAILY BEFORE MEALS 270 capsule 2   diltiazem (CARDIZEM CD) 180 MG 24 hr capsule Take 1 capsule (180 mg total) by mouth daily.     No facility-administered medications prior to visit.     Allergies:   Enalapril maleate   Social History   Socioeconomic History   Marital  status: Married    Spouse name: Not on file   Number of children: Not on file   Years of education: Not on file   Highest education level: Not on file  Occupational History   Not on file  Tobacco Use   Smoking status: Never   Smokeless tobacco: Never  Substance and Sexual Activity   Alcohol use: No   Drug use: No   Sexual activity: Not on file  Other Topics Concern   Not on file  Social History Narrative   Not on file   Social Determinants of Health   Financial Resource Strain: Not on file  Food Insecurity: Not on file  Transportation Needs: Not on file  Physical Activity: Not on file  Stress: Not on file  Social Connections: Not on file     Family History:  The patient's family history includes Diabetes type II in his mother; Heart attack in his father.   Review of Systems:    Please see the history of present illness.     All other systems reviewed and are otherwise negative except as noted above.   Physical Exam:    VS:  BP 118/66   Pulse 98   Ht 5' 8"  (1.727 m)   Wt 163 lb (73.9 kg)   SpO2 99%   BMI 24.78 kg/m    General: Pleasant elderly male appearing in no acute distress. Sitting in wheelchair.  Head: Normocephalic, atraumatic. Neck: No carotid bruits. JVD not elevated.  Lungs: Respirations regular and unlabored, without wheezes or rales.  Heart: Regular rate and rhythm. No S3 or S4.  No murmur, no rubs, or gallops appreciated. Abdomen: Appears non-distended. No obvious abdominal masses. Msk:  Strength and tone appear normal for age. No obvious joint deformities or effusions. Extremities: No clubbing or cyanosis. No pitting edema.  Distal pedal pulses are 2+ bilaterally. Neuro: Alert and oriented X 3. Moves all extremities spontaneously. No focal deficits noted. Psych:  Responds to questions appropriately with a normal affect. Skin: No rashes or lesions noted  Wt Readings from Last 3 Encounters:  02/22/21 163 lb (73.9 kg)  02/03/21 170 lb 13.7 oz  (77.5 kg)  01/22/21 192 lb 14.4 oz (87.5 kg)     Studies/Labs Reviewed:   EKG:  EKG is not ordered today.    Recent Labs: 01/04/2021: TSH 1.477 02/02/2021: ALT 14 02/05/2021: Hemoglobin 8.1; Platelets 194 02/06/2021: BUN 53; Creatinine, Ser 3.03; Magnesium 1.7; Potassium 3.7; Sodium 138   Lipid Panel    Component Value Date/Time   CHOL 103 07/20/2019 0938   TRIG 81 07/20/2019 0938   HDL 38 (L) 07/20/2019 0938   CHOLHDL 2.7 07/20/2019 0938   VLDL 25.2 02/22/2016 1522   LDLCALC 49 07/20/2019 0938   LDLDIRECT 54 05/27/2016 1421    Additional studies/ records that were reviewed today include:   Echocardiogram: 12/2020 IMPRESSIONS     1. Left ventricular ejection fraction, by estimation, is 60 to 65%. The  left ventricle has normal function. The left ventricle has no regional  wall motion abnormalities. There is mild left ventricular hypertrophy.  Left ventricular diastolic parameters  are consistent with Grade I diastolic dysfunction (impaired relaxation).   2. Right ventricular systolic function is normal. The right ventricular  size is normal. There is normal pulmonary artery systolic pressure.   3. The mitral valve is normal in structure. No evidence of mitral valve  regurgitation. No evidence of mitral stenosis.   4. The aortic valve is tricuspid. Aortic valve regurgitation is not  visualized. Mild aortic valve sclerosis is present, with no evidence of  aortic valve stenosis.   5. Aortic dilatation noted. There is mild dilatation of the aortic root,  measuring 39 mm.   6. The inferior vena cava is normal in size with greater than 50%  respiratory variability, suggesting right atrial pressure of 3 mmHg.   Assessment:    1. Palpitations   2. SVT (supraventricular tachycardia) (HCC)   3. Atherosclerosis of native coronary artery of native heart without angina pectoris   4. Essential hypertension   5. Aortic root dilation (HCC)   6. AKI (acute kidney injury) ([place])       Plan:   In  order of problems listed above:  1. Palpitations/SVT - During his admission for COVID-19 and an AKI, he was found to have episodes of SVT, PAC's and NSVT. He does report intermittent palpitations and heart rate has been elevated into the 140's while working with PT but quickly improves which is possibly due to an arrhythmia or deconditioning. He is currently taking Cardizem CD 180 mg daily and Lopressor 50 mg twice daily. I did recommend that we titrate Cardizem CD from 180 mg daily to 240 mg daily. I encouraged the patient or his wife to make [place] aware if palpitations persist as we could arrange for ZIO placement while at rehab.  2. CAD - He did have coronary calcifications by prior CT with low-risk NST in 2013. Recent echocardiogram showed a preserved EF with no wall motion abnormalities. - He denies any recent anginal symptoms. Continue ASA, Lopressor and statin therapy.  3. HTN - His BP is well controlled at 118/66 during today's visit. Continue Lopressor 50 mg twice daily and will titrate Cardizem CD to 240 mg daily as outlined above.  4. Dilation of Aortic Root - His aortic root measured at 39 mm by recent echocardiogram. Will continue to follow over time .  5. AKI - He did require initiation of dialysis during his prior admission but has now not required dialysis for over 3 weeks. He is being followed closely by Nephrology. Reports still having good urine output.     Medication Adjustments/Labs and Tests Ordered: Current medicines are reviewed at length with the patient today.  Concerns regarding medicines are outlined above.  Medication changes, Labs and Tests ordered today are listed in the Patient Instructions below. Patient Instructions  Medication Instructions:  Your physician has recommended you make the following change in your medication:  INCREASE Cardizem CD to 240 mg tablets daily  *If you need a refill on your cardiac medications before your next  appointment, please call your pharmacy*   Lab Work: None If you have labs (blood work) drawn today and your tests are completely normal, you will receive your results only by: [place] (if you have MyChart) OR A paper copy in the mail If you have any lab test that is abnormal or we need to change your treatment, we will call you to review the results.   Testing/Procedures: None   Follow-Up: At [facility], you and your health needs are our priority.  As part of our continuing mission to provide you with exceptional heart care, we have created designated Provider Care Teams.  These Care Teams include your primary Cardiologist (physician) and Advanced Practice Providers (APPs -  Physician Assistants and Nurse Practitioners) who all work together to provide you with the care you need, when you need it.  We recommend signing up for the patient portal called "MyChart".  Sign up information is provided on this After Visit Summary.  MyChart is used to connect with patients for Virtual Visits (Telemedicine).  Patients are able to view lab/test results, encounter notes, upcoming appointments, etc.  Non-urgent messages can be sent to your provider as well.   To learn more about what you can do with MyChart, go to [email or web address].    Your next appointment:   3 month(s)  The format for your next appointment:   In Person  Provider:   You may see Rozann Lesches, MD or one of the following Advanced Practice Providers on your designated Care Team:   Bernerd Pho, [place]    Other Instructions  Signed, Erma Heritage, PA-C  02/22/2021 5:37 PM    [place] [address] [place], [place] [address] Phone: [phone] Fax: [phone]

## 2021-02-22 NOTE — Patient Instructions (Signed)
Medication Instructions:  Your physician has recommended you make the following change in your medication:  INCREASE Cardizem CD to 240 mg tablets daily  *If you need a refill on your cardiac medications before your next appointment, please call your pharmacy*   Lab Work: None If you have labs (blood work) drawn today and your tests are completely normal, you will receive your results only by: [place] (if you have MyChart) OR A paper copy in the mail If you have any lab test that is abnormal or we need to change your treatment, we will call you to review the results.   Testing/Procedures: None   Follow-Up: At [facility], you and your health needs are our priority.  As part of our continuing mission to provide you with exceptional heart care, we have created designated Provider Care Teams.  These Care Teams include your primary Cardiologist (physician) and Advanced Practice Providers (APPs -  Physician Assistants and Nurse Practitioners) who all work together to provide you with the care you need, when you need it.  We recommend signing up for the patient portal called "MyChart".  Sign up information is provided on this After Visit Summary.  MyChart is used to connect with patients for Virtual Visits (Telemedicine).  Patients are able to view lab/test results, encounter notes, upcoming appointments, etc.  Non-urgent messages can be sent to your provider as well.   To learn more about what you can do with MyChart, go to [email or web address].    Your next appointment:   3 month(s)  The format for your next appointment:   In Person  Provider:   You may see Rozann Lesches, MD or one of the following Advanced Practice Providers on your designated Care Team:   Bernerd Pho, [place]    Other Instructions

## 2021-02-23 DIAGNOSIS — G4720 Circadian rhythm sleep disorder, unspecified type: Secondary | ICD-10-CM | POA: Diagnosis not present

## 2021-02-26 ENCOUNTER — Ambulatory Visit: Payer: PPO | Admitting: Podiatry

## 2021-02-26 DIAGNOSIS — N186 End stage renal disease: Secondary | ICD-10-CM | POA: Diagnosis not present

## 2021-02-26 DIAGNOSIS — Z992 Dependence on renal dialysis: Secondary | ICD-10-CM | POA: Diagnosis not present

## 2021-02-26 DIAGNOSIS — Z452 Encounter for adjustment and management of vascular access device: Secondary | ICD-10-CM | POA: Diagnosis not present

## 2021-02-27 DIAGNOSIS — N2581 Secondary hyperparathyroidism of renal origin: Secondary | ICD-10-CM | POA: Diagnosis not present

## 2021-02-27 DIAGNOSIS — D631 Anemia in chronic kidney disease: Secondary | ICD-10-CM | POA: Diagnosis not present

## 2021-02-27 DIAGNOSIS — E875 Hyperkalemia: Secondary | ICD-10-CM | POA: Diagnosis not present

## 2021-02-27 DIAGNOSIS — I129 Hypertensive chronic kidney disease with stage 1 through stage 4 chronic kidney disease, or unspecified chronic kidney disease: Secondary | ICD-10-CM | POA: Diagnosis not present

## 2021-02-27 DIAGNOSIS — N179 Acute kidney failure, unspecified: Secondary | ICD-10-CM | POA: Diagnosis not present

## 2021-02-27 DIAGNOSIS — E1122 Type 2 diabetes mellitus with diabetic chronic kidney disease: Secondary | ICD-10-CM | POA: Diagnosis not present

## 2021-02-27 DIAGNOSIS — E872 Acidosis: Secondary | ICD-10-CM | POA: Diagnosis not present

## 2021-02-27 DIAGNOSIS — N184 Chronic kidney disease, stage 4 (severe): Secondary | ICD-10-CM | POA: Diagnosis not present

## 2021-02-28 DIAGNOSIS — I10 Essential (primary) hypertension: Secondary | ICD-10-CM | POA: Diagnosis not present

## 2021-02-28 DIAGNOSIS — L603 Nail dystrophy: Secondary | ICD-10-CM | POA: Diagnosis not present

## 2021-02-28 DIAGNOSIS — E1159 Type 2 diabetes mellitus with other circulatory complications: Secondary | ICD-10-CM | POA: Diagnosis not present

## 2021-02-28 DIAGNOSIS — M2042 Other hammer toe(s) (acquired), left foot: Secondary | ICD-10-CM | POA: Diagnosis not present

## 2021-02-28 DIAGNOSIS — M2041 Other hammer toe(s) (acquired), right foot: Secondary | ICD-10-CM | POA: Diagnosis not present

## 2021-03-07 DIAGNOSIS — A419 Sepsis, unspecified organism: Secondary | ICD-10-CM | POA: Diagnosis not present

## 2021-03-07 DIAGNOSIS — Z794 Long term (current) use of insulin: Secondary | ICD-10-CM | POA: Diagnosis not present

## 2021-03-07 DIAGNOSIS — T8130XA Disruption of wound, unspecified, initial encounter: Secondary | ICD-10-CM | POA: Diagnosis not present

## 2021-03-07 DIAGNOSIS — E785 Hyperlipidemia, unspecified: Secondary | ICD-10-CM | POA: Diagnosis not present

## 2021-03-07 DIAGNOSIS — Z7982 Long term (current) use of aspirin: Secondary | ICD-10-CM | POA: Diagnosis not present

## 2021-03-07 DIAGNOSIS — K760 Fatty (change of) liver, not elsewhere classified: Secondary | ICD-10-CM | POA: Diagnosis not present

## 2021-03-07 DIAGNOSIS — G9341 Metabolic encephalopathy: Secondary | ICD-10-CM | POA: Diagnosis not present

## 2021-03-07 DIAGNOSIS — N186 End stage renal disease: Secondary | ICD-10-CM | POA: Diagnosis not present

## 2021-03-07 DIAGNOSIS — I10 Essential (primary) hypertension: Secondary | ICD-10-CM | POA: Diagnosis not present

## 2021-03-07 DIAGNOSIS — E119 Type 2 diabetes mellitus without complications: Secondary | ICD-10-CM | POA: Diagnosis not present

## 2021-03-07 DIAGNOSIS — L02212 Cutaneous abscess of back [any part, except buttock]: Secondary | ICD-10-CM | POA: Diagnosis not present

## 2021-03-12 ENCOUNTER — Telehealth: Payer: Self-pay | Admitting: *Deleted

## 2021-03-12 DIAGNOSIS — I2510 Atherosclerotic heart disease of native coronary artery without angina pectoris: Secondary | ICD-10-CM | POA: Diagnosis not present

## 2021-03-12 DIAGNOSIS — Z87440 Personal history of urinary (tract) infections: Secondary | ICD-10-CM | POA: Diagnosis not present

## 2021-03-12 DIAGNOSIS — N186 End stage renal disease: Secondary | ICD-10-CM | POA: Diagnosis not present

## 2021-03-12 DIAGNOSIS — K760 Fatty (change of) liver, not elsewhere classified: Secondary | ICD-10-CM | POA: Diagnosis not present

## 2021-03-12 DIAGNOSIS — M1990 Unspecified osteoarthritis, unspecified site: Secondary | ICD-10-CM | POA: Diagnosis not present

## 2021-03-12 DIAGNOSIS — Z7982 Long term (current) use of aspirin: Secondary | ICD-10-CM | POA: Diagnosis not present

## 2021-03-12 DIAGNOSIS — M47816 Spondylosis without myelopathy or radiculopathy, lumbar region: Secondary | ICD-10-CM | POA: Diagnosis not present

## 2021-03-12 DIAGNOSIS — Z794 Long term (current) use of insulin: Secondary | ICD-10-CM | POA: Diagnosis not present

## 2021-03-12 DIAGNOSIS — Z992 Dependence on renal dialysis: Secondary | ICD-10-CM | POA: Diagnosis not present

## 2021-03-12 DIAGNOSIS — I132 Hypertensive heart and chronic kidney disease with heart failure and with stage 5 chronic kidney disease, or end stage renal disease: Secondary | ICD-10-CM | POA: Diagnosis not present

## 2021-03-12 DIAGNOSIS — E113311 Type 2 diabetes mellitus with moderate nonproliferative diabetic retinopathy with macular edema, right eye: Secondary | ICD-10-CM | POA: Diagnosis not present

## 2021-03-12 DIAGNOSIS — E782 Mixed hyperlipidemia: Secondary | ICD-10-CM | POA: Diagnosis not present

## 2021-03-12 DIAGNOSIS — K8689 Other specified diseases of pancreas: Secondary | ICD-10-CM | POA: Diagnosis not present

## 2021-03-12 DIAGNOSIS — H9191 Unspecified hearing loss, right ear: Secondary | ICD-10-CM | POA: Diagnosis not present

## 2021-03-12 DIAGNOSIS — I5030 Unspecified diastolic (congestive) heart failure: Secondary | ICD-10-CM | POA: Diagnosis not present

## 2021-03-12 DIAGNOSIS — N492 Inflammatory disorders of scrotum: Secondary | ICD-10-CM | POA: Diagnosis not present

## 2021-03-12 DIAGNOSIS — E1122 Type 2 diabetes mellitus with diabetic chronic kidney disease: Secondary | ICD-10-CM | POA: Diagnosis not present

## 2021-03-12 NOTE — Telephone Encounter (Signed)
Received call from [place], [facility] SN with [facility].  Reports that patient has been recently discharged from [facility]. States that [facility] SN/ OT/ PT was ordered.  Requested VO for [facility] SN 2x weekly x1 week, then 2x weekly x every other week for medication management. VO given.

## 2021-03-15 ENCOUNTER — Encounter (HOSPITAL_COMMUNITY)
Admission: RE | Admit: 2021-03-15 | Discharge: 2021-03-15 | Disposition: A | Discharge status: Home / Self Care | Payer: PPO | Source: Ambulatory Visit | Attending: Nephrology | Admitting: Nephrology

## 2021-03-15 ENCOUNTER — Encounter (HOSPITAL_COMMUNITY): Payer: Self-pay

## 2021-03-15 ENCOUNTER — Other Ambulatory Visit: Payer: Self-pay

## 2021-03-15 DIAGNOSIS — D631 Anemia in chronic kidney disease: Secondary | ICD-10-CM | POA: Diagnosis not present

## 2021-03-15 DIAGNOSIS — N184 Chronic kidney disease, stage 4 (severe): Secondary | ICD-10-CM | POA: Diagnosis not present

## 2021-03-15 LAB — POCT HEMOGLOBIN-HEMACUE: Hemoglobin: 8.1 g/dL — ABNORMAL LOW (ref 13.0–17.0)

## 2021-03-15 LAB — IRON AND TIBC
Iron: 55 ug/dL (ref 45–182)
Saturation Ratios: 22 % (ref 17.9–39.5)
TIBC: 245 ug/dL — ABNORMAL LOW (ref 250–450)
UIBC: 190 ug/dL

## 2021-03-15 LAB — FERRITIN: Ferritin: 160 ng/mL (ref 24–336)

## 2021-03-15 MED ORDER — EPOETIN ALFA-EPBX 10000 UNIT/ML IJ SOLN
10000.0000 [IU] | Freq: Once | INTRAMUSCULAR | Status: AC
Start: 2021-03-15 — End: 2021-03-15
  Administered 2021-03-15: 10000 [IU] via SUBCUTANEOUS

## 2021-03-15 MED ORDER — EPOETIN ALFA-EPBX 10000 UNIT/ML IJ SOLN
INTRAMUSCULAR | Status: AC
  Filled 2021-03-15: qty 1

## 2021-03-19 ENCOUNTER — Encounter: Payer: Self-pay | Admitting: Family Medicine

## 2021-03-19 ENCOUNTER — Other Ambulatory Visit: Payer: Self-pay

## 2021-03-19 ENCOUNTER — Ambulatory Visit (INDEPENDENT_AMBULATORY_CARE_PROVIDER_SITE_OTHER): Payer: PPO | Admitting: Family Medicine

## 2021-03-19 VITALS — BP 134/64 | HR 72 | Temp 98.3°F | Resp 18 | Ht 68.0 in | Wt 181.0 lb

## 2021-03-19 DIAGNOSIS — N1832 Chronic kidney disease, stage 3b: Secondary | ICD-10-CM | POA: Diagnosis not present

## 2021-03-19 DIAGNOSIS — R5381 Other malaise: Secondary | ICD-10-CM | POA: Diagnosis not present

## 2021-03-19 DIAGNOSIS — E1142 Type 2 diabetes mellitus with diabetic polyneuropathy: Secondary | ICD-10-CM

## 2021-03-19 DIAGNOSIS — U071 COVID-19: Secondary | ICD-10-CM | POA: Diagnosis not present

## 2021-03-19 DIAGNOSIS — Z8679 Personal history of other diseases of the circulatory system: Secondary | ICD-10-CM

## 2021-03-19 NOTE — Progress Notes (Signed)
Subjective:    Patient ID: Mark Hose., male    DOB: July 31, 1941, 80 y.o.   MRN: [id number]  HPI Admit date: 12/23/2020 Discharge date: 01/25/2021   Time spent: 35 minutes   Recommendations for Outpatient Follow-up:  Reassess blood pressure and further adjust antihypertensive regimen Repeat basic metabolic panel to evaluate lites and renal function. Repeat complete blood count to follow hemoglobin and platelets stability.   Discharge Diagnoses:  Active Problems:   AKI (acute kidney injury) ([place])   Acute renal failure superimposed on stage 3b chronic kidney disease (HCC)   Acute metabolic encephalopathy   COVID-19 virus infection   Metabolic acidosis   SVT (supraventricular tachycardia) (HCC)   Pressure injury of skin   Shingles   Vascular dialysis catheter in place [facility])     Discharge Condition: Stable and improved.  Discharge to skilled nursing facility for further care and rehabilitation   CODE STATUS: Full code.   Diet recommendation: Modified carbohydrate diet and renal diet.        Filed Weights    01/21/21 0506 01/22/21 0500 01/22/21 1740  Weight: 91.4 kg 87.2 kg 87.5 kg      History of present illness:  As per H&P written by Dr. Manuella Ghazi on 12/23/2020 Mark Jumbo Sr. is a 80 y.o. male with medical history significant for type 2 diabetes, CKD stage IIIb, hypertension, dyslipidemia, diastolic CHF, and CAD described to the ED via EMS with increasing weakness and confusion along with poor appetite for the last 2 to 3 days.  Symptoms have been present over the past week and patient was diagnosed with COVID-19 infection on May 7.  He saw his PCP on 5/19 for confusion and was not noted to be hypoxemic at that time.  He was told to hold his Lasix and encouraged to drink plenty of fluids, but he really has not been able to drink very much fluids according to the wife at the bedside.  He was started on some mild delirium related to his COVID infection.  Unfortunately,  his symptoms have progressed significantly to days.  Patient cannot give any further history at this time given his severe confusion.  Primary historian his wife at bedside.   ED Course: Vital signs with some initial hypotension that has responded to fluid bolus.  Additionally he was noted to be tachypneic on arrival to the ED, but tachypnea appears to be improving.  He is otherwise afebrile.  CT head with no acute findings noted.  He is noted to have significantly elevated creatinine of 6.87 for his baseline is usually between 1.7-2.  BUN is 124 and calcium is 5.8.  He has received LR fluid bolus in the ED as well as calcium supplementation and bicarbonate.  ABG shows pH of 7.108 with PCO2 of 19.9.  He is not currently hypoxemic.  He is noted to be COVID-positive.  Blood glucose was 321 and he was started on insulin drip.  Nephrology was contacted by EDP with recommendations for standard work-up of AKI as well as sodium bicarbonate infusion.  No need for urgent hemodialysis as potassium levels are within normal limits.   Hospital Course:  AKI on CKD stage IIIb-voiding well, nonoliguric - -Suspect patient had ischemic ATN due to hypotension and dehydration/volume depletion -Started on HD on 12/29/2020 due to azotemia, last HD session was 01/24/2021 -Overall mentation and overall Renal function improved with hemodialysis. -As per nephrologist recommendations restarted on hemodialysis on 01/22/21 given ongoing episodes of confusion and  rising BUN.   -Patient had dialysis last on 01/24/2021, will tolerated and without complications. -Right IJ PermCath placed for HD on 01/22/2021; not longer oozing or bleeding appreciated. -Dialysis treatment anticipated as an outpatient on 01/26/2021. -Patient will be transferred to skilled nursing facility for further care, rehabilitation and conditioning.   Acute shingles outbreak - involving the left foot and leg in a dermatomal pattern. -Patient received treatment with  Valtrex for 7 days -Currently all lesions have crusted and healed. -No longer requiring airborne isolation/precaution.   Acute metabolic encephalopathy-much improved and stable. -intermittently/Occasional episodes of confusion and hallucinations; especially in between dialysis treatments. -Continue constant reorientation and supportive care.   -patient is very hard of hearing and without hearing aids in place might suggest confusional state as unable to answer question correctly.   Hypocalcemia/hypokalemia/hypomagnesemia -replaced and overall stable. -Continue calcitriol and cholecalciferol.     -Status post calcium gluconate on 01/05/2021. -Continue to follow electrolytes trend follow further recommendations by nephrology service.   Anemia in CKD- he was transfused 2 units PRBC on 01/07/2021. -ESA/Epogen per nephrology team -Follow hemoglobin trend and further transfuse as clinically indicated.    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 -Continue to follow CBGs for further adjustment into hypoglycemic regimen.  -At time of presentation patient experienced DKA; which is now resolved and no longer present.      Recent COVID-19 infection- patient status post 3 days of remdesivir for asymptomatic infection.  He has been stable on room air.  He is now over  30 days post diagnosis and no longer in quarantine.   Thrombocytopenia-resolved. -Continue to check platelet count intermittently.   SVT/A. fib with RVR- Cardizem and Lopressor as ordered by cardiology service.   -Patient Cardizem dose increased to 180 mg per cardiology recs.  -will continue metoprolol 50 mg BID.  -Continue outpatient follow-up with cardiology service.   Coronary artery disease -no chest pain symptoms at this time.   -Continue aspirin, pravastatin and metoprolol -Continue heart healthy diet. -Continue outpatient  follow-up with cardiology service.   Resting tremor - Possibly related to severe low Mg levels, seems to be improving; continue to follow closely.    -Consider outpatient neurology follow-up if persistent despite hemodialysis treatment and electrolytes stabilization.   Essential hypertension -stable on current medications. -Continue heart healthy/renal diet.   Aortic root dilatation -Continue outpatient follow-up with cardiology service.  -stable per records.    Leukocytosis  -Appears to be reactive -No fevers no evidence of new infection per se -Repeat complete blood count and if WBC's still elevated, may need outpatient hematology follow-up.   03/19/21 Patient is here today for hospital discharge follow-up.  He looks remarkable given everything that has been through.  Has been home from the hospital approximately 1 week.  He is mentating much better.  He is alert and interactive and answering questions quickly and appropriately.  This is the best I have seen him cognitively in quite some time.  Unfortunately, the patient suffered hemodialysis dependent kidney failure due to the COVID infection.  He was on dialysis for a few weeks.  Remarkably he recovered and he is currently not on dialysis however his baseline creatinine has been between 2.5 and 3.0.  Due to acidosis, he was discharged from the nursing facility on sodium bicarbonate as well as Lokelma 10 mg 3 times daily.  He is not taking the sodium bicarbonate and has not taken it  for more than a week although he is still taking the [facility] however is extremely expensive.  He also developed some swelling in his legs.  He has +1 edema to his knees bilaterally.  He denies any chest pain or shortness of breath.  His heart rate is well controlled today on the 240 mg of diltiazem.  He has resumed his previous dose of insulin which was 18 units of Lantus in the morning with 10 units of regular insulin in the morning.  However he recently experienced  hypoglycemic episode in the middle of the night and therefore he decided to decrease his Basaglar to 15 units a day. Past Medical History:  Diagnosis Date   Arthritis    lower back   Chronic back pain    Disc disease   Chronic kidney disease    Coronary atherosclerosis of native coronary artery    Coronary calcifications by chest CT, Myoview demonstrating inferolateral scar   Deafness in right ear    Diabetes mellitus    Diabetic retinopathy (HCC)    moderate nonproliferative with macular edema in right eye   Diastolic dysfunction 12/3662   Grade 1.   Essential hypertension, benign    Heart murmur    in past   HOH (hard of hearing)    Hyperkalemia    Kidney stones    Mixed hyperlipidemia    NAFLD (nonalcoholic fatty liver disease)    Pancreatic insufficiency    Diagnosed at [facility]   Prostate cancer [facility]) 2011   Shortness of breath dyspnea    occasional - liver pressing on right lung, decreased capacity   Subdural hematoma (HCC)    Type 2 diabetes mellitus ([place])    Wears hearing aid    "crossover" form right to left   Past Surgical History:  Procedure Laterality Date   APPENDECTOMY     Bilateral hip replacement     CATARACT EXTRACTION W/PHACO Left 05/15/2015   Procedure: CATARACT EXTRACTION PHACO AND INTRAOCULAR LENS PLACEMENT ([place]);  Surgeon: Ronnell Freshwater, MD;  Location: [place];  Service: Ophthalmology;  Laterality: Left;  DIABETIC - insulin pump and oral meds   CATARACT EXTRACTION W/PHACO Right 08/28/2015   Procedure: CATARACT EXTRACTION PHACO AND INTRAOCULAR LENS PLACEMENT (IOC);  Surgeon: Ronnell Freshwater, MD;  Location: [place];  Service: Ophthalmology;  Laterality: Right;  DIABETIC PER PT AND CINDY PLEASE KEEP ARRIVAL TIME AFTER 8AM    CHOLECYSTECTOMY     HERNIA REPAIR     INSERTION OF DIALYSIS CATHETER Right 01/22/2021   Procedure: INSERTION OF DIALYSIS CATHETER;  Surgeon: Virl Cagey, MD;  Location: [facility];   Service: General;  Laterality: Right;   PROSTATECTOMY  2011   Current Outpatient Medications on File Prior to Visit  Medication Sig Dispense Refill   aspirin EC 81 MG tablet Take 81 mg by mouth at bedtime.      calcitRIOL (ROCALTROL) 0.25 MCG capsule Take 1 capsule (0.25 mcg total) by mouth daily.     calcium acetate (PHOSLO) 667 MG capsule Take 1,334 mg by mouth 3 (three) times daily with meals.     Cholecalciferol (DIALYVITE VITAMIN D 5000 PO) Take by mouth.     diltiazem (CARDIZEM CD) 240 MG 24 hr capsule Take 1 capsule (240 mg total) by mouth daily. 90 capsule 3   insulin aspart (NOVOLOG) 100 UNIT/ML injection Inject 8 Units into the skin 3 (three) times daily with meals. (Patient taking differently: Inject 10 Units into the skin in  the morning.) 10 mL 11   insulin glargine (LANTUS) 100 UNIT/ML injection Inject 0.04 mLs (4 Units total) into the skin at bedtime. (Patient taking differently: Inject 18 Units into the skin daily.) 10 mL 11   LOKELMA 10 g PACK packet Take 1 packet by mouth 3 (three) times daily.     metoprolol tartrate (LOPRESSOR) 50 MG tablet Take 1 tablet (50 mg total) by mouth 2 (two) times daily.     Multiple Vitamin (MULTIVITAMIN WITH MINERALS) TABS Take 1 tablet by mouth daily.     pravastatin (PRAVACHOL) 10 MG tablet TAKE 1 TABLET(10 MG) BY MOUTH DAILY WITH BREAKFAST 90 tablet 3   ZENPEP 10000-32000 units CPEP TAKE 1 CAPSULE BY MOUTH THREE TIMES DAILY BEFORE MEALS 270 capsule 2   No current facility-administered medications on file prior to visit.   Marland Kitchenall Social History   Socioeconomic History   Marital status: Married    Spouse name: Not on file   Number of children: Not on file   Years of education: Not on file   Highest education level: Not on file  Occupational History   Not on file  Tobacco Use   Smoking status: Never   Smokeless tobacco: Never  Substance and Sexual Activity   Alcohol use: No   Drug use: No   Sexual activity: Not on file  Other Topics  Concern   Not on file  Social History Narrative   Not on file   Social Determinants of Health   Financial Resource Strain: Not on file  Food Insecurity: Not on file  Transportation Needs: Not on file  Physical Activity: Not on file  Stress: Not on file  Social Connections: Not on file  Intimate Partner Violence: Not on file      Review of Systems  All other systems reviewed and are negative.     Objective:   Physical Exam Constitutional:      General: He is not in acute distress.    Appearance: Normal appearance. He is normal weight. He is not ill-appearing or toxic-appearing.  Cardiovascular:     Rate and Rhythm: Normal rate. Rhythm irregular.     Heart sounds: Normal heart sounds.  Pulmonary:     Effort: Pulmonary effort is normal.     Breath sounds: Rales present.  Abdominal:     General: Abdomen is flat. Bowel sounds are normal.     Palpations: Abdomen is soft.  Genitourinary:   Musculoskeletal:     Right lower leg: Edema present.     Left lower leg: Edema present.  Neurological:     Mental Status: He is alert.    Patient also has a large hole in the perineum between his scrotum and his anus.  This is roughly 4 cm in diameter and approximately 1.5 cm deep.  There is a smaller hole just posterior to that.  This is apparently where he had an incision and drainage of a large sebaceous cyst.  Surgery is allowing this to close secondarily.  They are packing this every day with iodoform gauze.  There is no erythema or warmth or redness or fluctuant drainage.  There is no evidence of any secondary infection      Assessment & Plan:   COVID-19  Physical deconditioning  History of ASCVD  DM type 2 with diabetic peripheral neuropathy ([place]) - Plan: CBC with Differential/Platelet, COMPLETE METABOLIC PANEL WITH GFR, Hemoglobin A1c  Stage 3b chronic kidney disease ([place]) - Plan: CBC with Differential/Platelet, COMPLETE METABOLIC  PANEL WITH GFR, Hemoglobin A1c Recheck  the patient's electrolytes today including a CMP.  If his potassium is low as it was on July 4, perhaps we can start weaning away from the [facility].  He has not had his bicarbonate checked since July.  If his bicarbonate is normal, I believe he can discontinue the sodium bicarbonate as he has not taken that in over a week since discharge from the nursing facility.  His blood pressure today is acceptable.  He does have some swelling in his legs, +1 edema to both knees.  If his renal function is improving, perhaps he would benefit from a low-dose diuretic such as Lasix which may also help with the hyperkalemia allowing [place] to wean him away from the [facility].  Surgery is managing the large open wound in his perineum.  Check an A1c.  Decrease Lantus to 12 units a day to avoid hypoglycemia and then reassess in 2 weeks

## 2021-03-20 ENCOUNTER — Telehealth: Payer: Self-pay | Admitting: *Deleted

## 2021-03-20 LAB — CBC WITH DIFFERENTIAL/PLATELET
Absolute Monocytes: 774 cells/uL (ref 200–950)
Basophils Absolute: 43 cells/uL (ref 0–200)
Basophils Relative: 0.5 %
Eosinophils Absolute: 370 cells/uL (ref 15–500)
Eosinophils Relative: 4.3 %
HCT: 30.5 % — ABNORMAL LOW (ref 38.5–50.0)
Hemoglobin: 9.4 g/dL — ABNORMAL LOW (ref 13.2–17.1)
Lymphs Abs: 2571 cells/uL (ref 850–3900)
MCH: 29.7 pg (ref 27.0–33.0)
MCHC: 30.8 g/dL — ABNORMAL LOW (ref 32.0–36.0)
MCV: 96.2 fL (ref 80.0–100.0)
MPV: 9.7 fL (ref 7.5–12.5)
Monocytes Relative: 9 %
Neutro Abs: 4842 cells/uL (ref 1500–7800)
Neutrophils Relative %: 56.3 %
Platelets: 222 10*3/uL (ref 140–400)
RBC: 3.17 10*6/uL — ABNORMAL LOW (ref 4.20–5.80)
RDW: 16.4 % — ABNORMAL HIGH (ref 11.0–15.0)
Total Lymphocyte: 29.9 %
WBC: 8.6 10*3/uL (ref 3.8–10.8)

## 2021-03-20 LAB — COMPLETE METABOLIC PANEL WITH GFR
AG Ratio: 1.2 (calc) (ref 1.0–2.5)
ALT: 20 U/L (ref 9–46)
AST: 18 U/L (ref 10–35)
Albumin: 3.5 g/dL — ABNORMAL LOW (ref 3.6–5.1)
Alkaline phosphatase (APISO): 128 U/L (ref 35–144)
BUN/Creatinine Ratio: 17 (calc) (ref 6–22)
BUN: 52 mg/dL — ABNORMAL HIGH (ref 7–25)
CO2: 19 mmol/L — ABNORMAL LOW (ref 20–32)
Calcium: 7.1 mg/dL — ABNORMAL LOW (ref 8.6–10.3)
Chloride: 108 mmol/L (ref 98–110)
Creat: 3.11 mg/dL — ABNORMAL HIGH (ref 0.70–1.28)
Globulin: 3 g/dL (calc) (ref 1.9–3.7)
Glucose, Bld: 164 mg/dL — ABNORMAL HIGH (ref 65–99)
Potassium: 3.4 mmol/L — ABNORMAL LOW (ref 3.5–5.3)
Sodium: 142 mmol/L (ref 135–146)
Total Bilirubin: 0.3 mg/dL (ref 0.2–1.2)
Total Protein: 6.5 g/dL (ref 6.1–8.1)
eGFR: 20 mL/min/{1.73_m2} — ABNORMAL LOW (ref >=60)

## 2021-03-20 LAB — HEMOGLOBIN A1C
Hgb A1c MFr Bld: 6.2 % of total Hgb — ABNORMAL HIGH (ref <5.7)
Mean Plasma Glucose: 131 mg/dL
eAG (mmol/L): 7.3 mmol/L

## 2021-03-20 MED ORDER — LOKELMA 10 G PO PACK: 1.0000 | PACK | Freq: Two times a day (BID) | ORAL | Status: DC

## 2021-03-20 MED ORDER — METOPROLOL TARTRATE 50 MG PO TABS: 50.0000 mg | ORAL_TABLET | Freq: Two times a day (BID) | ORAL | 1 refills | Status: DC

## 2021-03-20 MED ORDER — PRAVASTATIN SODIUM 10 MG PO TABS: ORAL_TABLET | ORAL | 1 refills | Status: DC

## 2021-03-20 MED ORDER — CALCITRIOL 0.25 MCG PO CAPS: 0.2500 ug | ORAL_CAPSULE | Freq: Every day | ORAL | 1 refills | Status: DC

## 2021-03-20 MED ORDER — INSULIN GLARGINE 100 UNIT/ML ~~LOC~~ SOLN: 12.0000 [IU] | Freq: Every day | SUBCUTANEOUS | 11 refills | Status: DC

## 2021-03-20 MED ORDER — CALCIUM ACETATE (PHOS BINDER) 667 MG PO CAPS
1334.0000 mg | ORAL_CAPSULE | Freq: Three times a day (TID) | ORAL | 1 refills | Status: DC
Start: 2021-03-20 — End: 2021-04-18

## 2021-03-20 MED ORDER — DILTIAZEM HCL ER COATED BEADS 240 MG PO CP24: 240.0000 mg | ORAL_CAPSULE | Freq: Every day | ORAL | 1 refills | Status: DC

## 2021-03-20 NOTE — Telephone Encounter (Signed)
Lab results from 03/19/2021 are as follows: I am concerned that his calcium level is very low.  Please verify how often he is taking PhosLo (calcium acetate) as well as calcitriol.   Patient wife reports the following: Caleitriol. 0.25 MCG.         1 cap at breakfast   Calcium Acetate.  688m.   2 caps. 3 times per day

## 2021-03-21 ENCOUNTER — Telehealth: Payer: Self-pay | Admitting: *Deleted

## 2021-03-21 DIAGNOSIS — N1832 Chronic kidney disease, stage 3b: Secondary | ICD-10-CM

## 2021-03-21 NOTE — Telephone Encounter (Signed)
Received call from [place], [facility] with [place] [phone]~ telephone.   Reports that patient has gained 9lbs since last [facility] visit on 8/8 (173lbs) to 8/17 (182lbs). Patient had appointment with PC on 8/15 (181lbs)  States that patient has piting edema to RLE and crackles to LLL bases.   Patient report that he feels fine and has no more swelling than he had at appointment on 8/15 and LLL crackles were noted at that time as well.   Please advise.

## 2021-03-22 ENCOUNTER — Encounter: Payer: Self-pay | Admitting: Family Medicine

## 2021-03-22 DIAGNOSIS — E1122 Type 2 diabetes mellitus with diabetic chronic kidney disease: Secondary | ICD-10-CM | POA: Diagnosis not present

## 2021-03-22 DIAGNOSIS — E875 Hyperkalemia: Secondary | ICD-10-CM | POA: Diagnosis not present

## 2021-03-22 DIAGNOSIS — E872 Acidosis: Secondary | ICD-10-CM | POA: Diagnosis not present

## 2021-03-22 DIAGNOSIS — D631 Anemia in chronic kidney disease: Secondary | ICD-10-CM | POA: Diagnosis not present

## 2021-03-22 DIAGNOSIS — N184 Chronic kidney disease, stage 4 (severe): Secondary | ICD-10-CM | POA: Diagnosis not present

## 2021-03-22 DIAGNOSIS — N179 Acute kidney failure, unspecified: Secondary | ICD-10-CM | POA: Diagnosis not present

## 2021-03-22 DIAGNOSIS — N2581 Secondary hyperparathyroidism of renal origin: Secondary | ICD-10-CM | POA: Diagnosis not present

## 2021-03-22 DIAGNOSIS — I129 Hypertensive chronic kidney disease with stage 1 through stage 4 chronic kidney disease, or unspecified chronic kidney disease: Secondary | ICD-10-CM | POA: Diagnosis not present

## 2021-03-22 MED ORDER — FUROSEMIDE 20 MG PO TABS: 20.0000 mg | ORAL_TABLET | Freq: Every day | ORAL | 3 refills | Status: DC | PRN

## 2021-03-22 NOTE — Telephone Encounter (Signed)
Call placed to patient and patient made aware.   Prescription sent to pharmacy.   Future lab orders placed.

## 2021-03-23 ENCOUNTER — Other Ambulatory Visit: Payer: Self-pay | Admitting: Family Medicine

## 2021-03-23 ENCOUNTER — Inpatient Hospital Stay: Payer: PPO | Admitting: Family Medicine

## 2021-03-23 MED ORDER — LOKELMA 10 G PO PACK: PACK | ORAL | Status: DC

## 2021-03-26 ENCOUNTER — Other Ambulatory Visit: Payer: PPO

## 2021-03-26 ENCOUNTER — Other Ambulatory Visit: Payer: Self-pay

## 2021-03-26 DIAGNOSIS — N1832 Chronic kidney disease, stage 3b: Secondary | ICD-10-CM | POA: Diagnosis not present

## 2021-03-26 LAB — BASIC METABOLIC PANEL
BUN/Creatinine Ratio: 19 (calc) (ref 6–22)
BUN: 52 mg/dL — ABNORMAL HIGH (ref 7–25)
CO2: 18 mmol/L — ABNORMAL LOW (ref 20–32)
Calcium: 7.7 mg/dL — ABNORMAL LOW (ref 8.6–10.3)
Chloride: 109 mmol/L (ref 98–110)
Creat: 2.81 mg/dL — ABNORMAL HIGH (ref 0.70–1.28)
Glucose, Bld: 289 mg/dL — ABNORMAL HIGH (ref 65–99)
Potassium: 4.3 mmol/L (ref 3.5–5.3)
Sodium: 141 mmol/L (ref 135–146)

## 2021-03-29 ENCOUNTER — Encounter (HOSPITAL_COMMUNITY)
Admission: RE | Admit: 2021-03-29 | Discharge: 2021-03-29 | Disposition: A | Discharge status: Home / Self Care | Payer: PPO | Source: Ambulatory Visit | Attending: Nephrology | Admitting: Nephrology

## 2021-03-29 ENCOUNTER — Other Ambulatory Visit: Payer: Self-pay

## 2021-03-29 ENCOUNTER — Encounter: Payer: Self-pay | Admitting: Family Medicine

## 2021-03-29 ENCOUNTER — Encounter (HOSPITAL_COMMUNITY): Payer: Self-pay

## 2021-03-29 DIAGNOSIS — N184 Chronic kidney disease, stage 4 (severe): Secondary | ICD-10-CM | POA: Diagnosis not present

## 2021-03-29 LAB — POCT HEMOGLOBIN-HEMACUE: Hemoglobin: 10.3 g/dL — ABNORMAL LOW (ref 13.0–17.0)

## 2021-03-29 MED ORDER — EPOETIN ALFA-EPBX 10000 UNIT/ML IJ SOLN
10000.0000 [IU] | Freq: Once | INTRAMUSCULAR | Status: AC
  Administered 2021-03-29: 10000 [IU] via SUBCUTANEOUS
  Filled 2021-03-29: qty 1

## 2021-04-04 DIAGNOSIS — T8130XA Disruption of wound, unspecified, initial encounter: Secondary | ICD-10-CM | POA: Diagnosis not present

## 2021-04-04 DIAGNOSIS — N454 Abscess of epididymis or testis: Secondary | ICD-10-CM | POA: Diagnosis not present

## 2021-04-06 ENCOUNTER — Ambulatory Visit (INDEPENDENT_AMBULATORY_CARE_PROVIDER_SITE_OTHER): Payer: PPO | Admitting: Family Medicine

## 2021-04-06 ENCOUNTER — Other Ambulatory Visit: Payer: PPO

## 2021-04-06 ENCOUNTER — Encounter: Payer: Self-pay | Admitting: Family Medicine

## 2021-04-06 ENCOUNTER — Other Ambulatory Visit: Payer: Self-pay | Admitting: *Deleted

## 2021-04-06 ENCOUNTER — Other Ambulatory Visit: Payer: Self-pay

## 2021-04-06 VITALS — BP 128/60 | HR 78 | Temp 98.9°F | Resp 16 | Ht 68.0 in | Wt 178.0 lb

## 2021-04-06 DIAGNOSIS — L97901 Non-pressure chronic ulcer of unspecified part of unspecified lower leg limited to breakdown of skin: Secondary | ICD-10-CM | POA: Diagnosis not present

## 2021-04-06 DIAGNOSIS — N186 End stage renal disease: Secondary | ICD-10-CM

## 2021-04-06 NOTE — Progress Notes (Signed)
Subjective:    Patient ID: Mark Hose., male    DOB: 12-Jul-1941, 79 y.o.   MRN: [id number]  HPI Patient presents today with a very complex problem.  He has a very painful rash over his posterior left calf and left thigh.  The rash is a reddish violaceous linear patch.  It appears to be libido reticularis.  It extends all over to the posterior lateral left calf where there is a small black scab consistent with necrotic eschar that is approximately 1.5 cm in diameter.  The rash is tender to the touch.  There is no evidence of secondary infection.  Past medical history is significant for end-stage renal disease having just stopped dialysis.  Rash appears to be consistent with calciphylaxis.  I reviewed his hospital records.  His last phosphate level was 4.0 July 4 of this year.  His last PTH level was 298.  That was May 26 of this year his last calcium level was 7.1 with an albumin of 3.5 which corrects to a calcium level of 7.9.  As result he is on calcitriol in addition to calcium acetate as a phosphate binder. Past Medical History:  Diagnosis Date   Arthritis    lower back   Chronic back pain    Disc disease   Chronic kidney disease    Coronary atherosclerosis of native coronary artery    Coronary calcifications by chest CT, Myoview demonstrating inferolateral scar   Deafness in right ear    Diabetes mellitus    Diabetic retinopathy (HCC)    moderate nonproliferative with macular edema in right eye   Diastolic dysfunction 12/5730   Grade 1.   Essential hypertension, benign    Heart murmur    in past   HOH (hard of hearing)    Hyperkalemia    Kidney stones    Mixed hyperlipidemia    NAFLD (nonalcoholic fatty liver disease)    Pancreatic insufficiency    Diagnosed at [facility]   Prostate cancer [facility]) 2011   Shortness of breath dyspnea    occasional - liver pressing on right lung, decreased capacity   Subdural hematoma (HCC)    Type 2 diabetes mellitus ([place])    Wears hearing  aid    "crossover" form right to left   Past Surgical History:  Procedure Laterality Date   APPENDECTOMY     Bilateral hip replacement     CATARACT EXTRACTION W/PHACO Left 05/15/2015   Procedure: CATARACT EXTRACTION PHACO AND INTRAOCULAR LENS PLACEMENT ([place]);  Surgeon: Ronnell Freshwater, MD;  Location: [place];  Service: Ophthalmology;  Laterality: Left;  DIABETIC - insulin pump and oral meds   CATARACT EXTRACTION W/PHACO Right 08/28/2015   Procedure: CATARACT EXTRACTION PHACO AND INTRAOCULAR LENS PLACEMENT ([facility]);  Surgeon: Ronnell Freshwater, MD;  Location: [place];  Service: Ophthalmology;  Laterality: Right;  DIABETIC PER PT AND CINDY PLEASE KEEP ARRIVAL TIME AFTER 8AM    CHOLECYSTECTOMY     HERNIA REPAIR     INSERTION OF DIALYSIS CATHETER Right 01/22/2021   Procedure: INSERTION OF DIALYSIS CATHETER;  Surgeon: Virl Cagey, MD;  Location: [facility];  Service: General;  Laterality: Right;   PROSTATECTOMY  2011   Current Outpatient Medications on File Prior to Visit  Medication Sig Dispense Refill   aspirin EC 81 MG tablet Take 81 mg by mouth at bedtime.      calcitRIOL (ROCALTROL) 0.25 MCG capsule Take 1 capsule (0.25 mcg total) by mouth daily. [place]  capsule 1   calcium acetate (PHOSLO) 667 MG capsule Take 2 capsules (1,334 mg total) by mouth 3 (three) times daily with meals. 540 capsule 1   Cholecalciferol (DIALYVITE VITAMIN D 5000 PO) Take by mouth.     diltiazem (CARDIZEM CD) 240 MG 24 hr capsule Take 1 capsule (240 mg total) by mouth daily. 90 capsule 1   furosemide (LASIX) 40 MG tablet Take 40 mg by mouth daily.     insulin aspart (NOVOLOG) 100 UNIT/ML injection Inject 8 Units into the skin 3 (three) times daily with meals. (Patient taking differently: Inject 10 Units into the skin in the morning.) 10 mL 11   insulin glargine (LANTUS) 100 UNIT/ML injection Inject 0.12 mLs (12 Units total) into the skin at bedtime. 10 mL 11   metoprolol tartrate  (LOPRESSOR) 50 MG tablet Take 1 tablet (50 mg total) by mouth 2 (two) times daily. 180 tablet 1   Multiple Vitamin (MULTIVITAMIN WITH MINERALS) TABS Take 1 tablet by mouth daily.     pravastatin (PRAVACHOL) 10 MG tablet TAKE 1 TABLET(10 MG) BY MOUTH DAILY WITH BREAKFAST 90 tablet 1   sodium bicarbonate 650 MG tablet Take 650 mg by mouth daily.     ZENPEP 10000-32000 units CPEP TAKE 1 CAPSULE BY MOUTH THREE TIMES DAILY BEFORE MEALS 270 capsule 2   No current facility-administered medications on file prior to visit.   Marland Kitchenall Social History   Socioeconomic History   Marital status: Married    Spouse name: Not on file   Number of children: Not on file   Years of education: Not on file   Highest education level: Not on file  Occupational History   Not on file  Tobacco Use   Smoking status: Never   Smokeless tobacco: Never  Substance and Sexual Activity   Alcohol use: No   Drug use: No   Sexual activity: Not on file  Other Topics Concern   Not on file  Social History Narrative   Not on file   Social Determinants of Health   Financial Resource Strain: Not on file  Food Insecurity: Not on file  Transportation Needs: Not on file  Physical Activity: Not on file  Stress: Not on file  Social Connections: Not on file  Intimate Partner Violence: Not on file      Review of Systems  All other systems reviewed and are negative.     Objective:   Physical Exam Constitutional:      General: He is not in acute distress.    Appearance: Normal appearance. He is normal weight. He is not ill-appearing or toxic-appearing.  Cardiovascular:     Rate and Rhythm: Normal rate. Rhythm irregular.     Heart sounds: Normal heart sounds.  Pulmonary:     Effort: Pulmonary effort is normal.     Breath sounds: Rales present.  Abdominal:     General: Abdomen is flat. Bowel sounds are normal.     Palpations: Abdomen is soft.  Musculoskeletal:     Right lower leg: No edema.     Left lower leg: No  edema.  Skin:    Findings: Erythema and rash present.       Neurological:     Mental Status: He is alert.        Assessment & Plan:   Calciphylaxis of lower extremity with nonhealing ulcer, limited to breakdown of skin ([place])  ESRD (end stage renal disease) ([place]) This presents a diagnostic challenge.  The patient has  hypocalcemia even when corrected.  Therefore he is on a calcium containing phosphate binder/PhosLo in addition to calcitriol.  My understanding with calciphylaxis is we need to try to drive his phosphate level down to 3.5 if possible using non calcium containing phosphate binder such as Sevelamer.  We should also consider adding Cinacalcet to achieve a PTH level less than 300 and is close to 150 as possible.  We should avoid vitamin D analogs such as calcitriol however this would certainly complicate his hypocalcemia.  He is not on iron or warfarin.  He is not currently receiving dialysis.  Therefore I would like to run this by his nephrologist (Dr. Royce Macadamia) as I am not sure how the changes that I am discussing above would affect his hypocalcemia.  Addendum: I was able to speak with his nephrologist, Dr. Royce Macadamia.  I really appreciate her time, her insight, and her assistance.  She is going to work the patient in next week to take a look at the leg and see if this is in fact calciphylaxis.  Tentatively our plan will be to reduce the calcitriol to every other day and switch off the PhosLo.  They will also decide about Sensipar at that visit and discussed dialysis as well.  Again I appreciate her time and her help.

## 2021-04-09 LAB — CBC WITH DIFFERENTIAL/PLATELET
Absolute Monocytes: 827 cells/uL (ref 200–950)
Basophils Absolute: 47 cells/uL (ref 0–200)
Basophils Relative: 0.5 %
Eosinophils Absolute: 254 cells/uL (ref 15–500)
Eosinophils Relative: 2.7 %
HCT: 32.5 % — ABNORMAL LOW (ref 38.5–50.0)
Hemoglobin: 9.9 g/dL — ABNORMAL LOW (ref 13.2–17.1)
Lymphs Abs: 2594 cells/uL (ref 850–3900)
MCH: 29.9 pg (ref 27.0–33.0)
MCHC: 30.5 g/dL — ABNORMAL LOW (ref 32.0–36.0)
MCV: 98.2 fL (ref 80.0–100.0)
MPV: 9.7 fL (ref 7.5–12.5)
Monocytes Relative: 8.8 %
Neutro Abs: 5678 cells/uL (ref 1500–7800)
Neutrophils Relative %: 60.4 %
Platelets: 253 10*3/uL (ref 140–400)
RBC: 3.31 10*6/uL — ABNORMAL LOW (ref 4.20–5.80)
RDW: 15.3 % — ABNORMAL HIGH (ref 11.0–15.0)
Total Lymphocyte: 27.6 %
WBC: 9.4 10*3/uL (ref 3.8–10.8)

## 2021-04-09 LAB — COMPLETE METABOLIC PANEL WITH GFR
AG Ratio: 1.3 (calc) (ref 1.0–2.5)
ALT: 17 U/L (ref 9–46)
AST: 15 U/L (ref 10–35)
Albumin: 3.9 g/dL (ref 3.6–5.1)
Alkaline phosphatase (APISO): 128 U/L (ref 35–144)
BUN/Creatinine Ratio: 22 (calc) (ref 6–22)
BUN: 70 mg/dL — ABNORMAL HIGH (ref 7–25)
CO2: 11 mmol/L — ABNORMAL LOW (ref 20–32)
Calcium: 7.9 mg/dL — ABNORMAL LOW (ref 8.6–10.3)
Chloride: 110 mmol/L (ref 98–110)
Creat: 3.16 mg/dL — ABNORMAL HIGH (ref 0.70–1.22)
Globulin: 3 g/dL (calc) (ref 1.9–3.7)
Glucose, Bld: 213 mg/dL — ABNORMAL HIGH (ref 65–99)
Potassium: 4.4 mmol/L (ref 3.5–5.3)
Sodium: 139 mmol/L (ref 135–146)
Total Bilirubin: 0.2 mg/dL (ref 0.2–1.2)
Total Protein: 6.9 g/dL (ref 6.1–8.1)
eGFR: 19 mL/min/{1.73_m2} — ABNORMAL LOW (ref >=60)

## 2021-04-09 LAB — PTH, INTACT AND CALCIUM
Calcium: 7.9 mg/dL — ABNORMAL LOW (ref 8.6–10.3)
PTH: 420 pg/mL — ABNORMAL HIGH (ref 16–77)

## 2021-04-09 LAB — PHOSPHORUS: Phosphorus: 9 mg/dL — ABNORMAL HIGH (ref 2.1–4.3)

## 2021-04-10 ENCOUNTER — Other Ambulatory Visit: Payer: PPO

## 2021-04-10 ENCOUNTER — Other Ambulatory Visit: Payer: Self-pay

## 2021-04-10 ENCOUNTER — Encounter: Payer: Self-pay | Admitting: Family Medicine

## 2021-04-10 DIAGNOSIS — R300 Dysuria: Secondary | ICD-10-CM | POA: Diagnosis not present

## 2021-04-11 LAB — URINE CULTURE
MICRO NUMBER:: 12335034
SPECIMEN QUALITY:: ADEQUATE

## 2021-04-11 LAB — URINALYSIS, ROUTINE W REFLEX MICROSCOPIC
Bacteria, UA: NONE SEEN /HPF
Bilirubin Urine: NEGATIVE
Glucose, UA: NEGATIVE
Hgb urine dipstick: NEGATIVE
Ketones, ur: NEGATIVE
Leukocytes,Ua: NEGATIVE
Nitrite: NEGATIVE
RBC / HPF: NONE SEEN /HPF (ref 0–2)
Specific Gravity, Urine: 1.013 (ref 1.001–1.035)
WBC, UA: NONE SEEN /HPF (ref 0–5)
pH: <=5 (ref 5.0–8.0)

## 2021-04-11 LAB — MICROSCOPIC MESSAGE

## 2021-04-12 ENCOUNTER — Ambulatory Visit (INDEPENDENT_AMBULATORY_CARE_PROVIDER_SITE_OTHER): Payer: PPO | Admitting: Family Medicine

## 2021-04-12 ENCOUNTER — Other Ambulatory Visit: Payer: Self-pay

## 2021-04-12 ENCOUNTER — Encounter (HOSPITAL_COMMUNITY)
Admission: RE | Admit: 2021-04-12 | Discharge: 2021-04-12 | Disposition: A | Discharge status: Home / Self Care | Payer: PPO | Source: Ambulatory Visit | Attending: Nephrology | Admitting: Nephrology

## 2021-04-12 VITALS — HR 82 | Temp 98.5°F | Resp 14 | Ht 68.0 in | Wt 178.0 lb

## 2021-04-12 DIAGNOSIS — K760 Fatty (change of) liver, not elsewhere classified: Secondary | ICD-10-CM | POA: Diagnosis present

## 2021-04-12 DIAGNOSIS — I5030 Unspecified diastolic (congestive) heart failure: Secondary | ICD-10-CM | POA: Diagnosis not present

## 2021-04-12 DIAGNOSIS — N2581 Secondary hyperparathyroidism of renal origin: Secondary | ICD-10-CM | POA: Diagnosis not present

## 2021-04-12 DIAGNOSIS — L89152 Pressure ulcer of sacral region, stage 2: Secondary | ICD-10-CM | POA: Diagnosis present

## 2021-04-12 DIAGNOSIS — E860 Dehydration: Secondary | ICD-10-CM | POA: Diagnosis present

## 2021-04-12 DIAGNOSIS — E1165 Type 2 diabetes mellitus with hyperglycemia: Secondary | ICD-10-CM | POA: Diagnosis present

## 2021-04-12 DIAGNOSIS — Z833 Family history of diabetes mellitus: Secondary | ICD-10-CM | POA: Diagnosis not present

## 2021-04-12 DIAGNOSIS — N179 Acute kidney failure, unspecified: Secondary | ICD-10-CM

## 2021-04-12 DIAGNOSIS — H833X1 Noise effects on right inner ear: Secondary | ICD-10-CM | POA: Diagnosis not present

## 2021-04-12 DIAGNOSIS — E43 Unspecified severe protein-calorie malnutrition: Secondary | ICD-10-CM | POA: Diagnosis present

## 2021-04-12 DIAGNOSIS — I2510 Atherosclerotic heart disease of native coronary artery without angina pectoris: Secondary | ICD-10-CM | POA: Diagnosis not present

## 2021-04-12 DIAGNOSIS — N186 End stage renal disease: Secondary | ICD-10-CM | POA: Diagnosis present

## 2021-04-12 DIAGNOSIS — I132 Hypertensive heart and chronic kidney disease with heart failure and with stage 5 chronic kidney disease, or end stage renal disease: Secondary | ICD-10-CM | POA: Diagnosis present

## 2021-04-12 DIAGNOSIS — E1122 Type 2 diabetes mellitus with diabetic chronic kidney disease: Secondary | ICD-10-CM | POA: Diagnosis present

## 2021-04-12 DIAGNOSIS — R531 Weakness: Secondary | ICD-10-CM | POA: Diagnosis present

## 2021-04-12 DIAGNOSIS — E1142 Type 2 diabetes mellitus with diabetic polyneuropathy: Secondary | ICD-10-CM | POA: Diagnosis present

## 2021-04-12 DIAGNOSIS — N185 Chronic kidney disease, stage 5: Secondary | ICD-10-CM | POA: Diagnosis not present

## 2021-04-12 DIAGNOSIS — L97901 Non-pressure chronic ulcer of unspecified part of unspecified lower leg limited to breakdown of skin: Secondary | ICD-10-CM

## 2021-04-12 DIAGNOSIS — Z452 Encounter for adjustment and management of vascular access device: Secondary | ICD-10-CM | POA: Diagnosis not present

## 2021-04-12 DIAGNOSIS — N390 Urinary tract infection, site not specified: Secondary | ICD-10-CM | POA: Diagnosis present

## 2021-04-12 DIAGNOSIS — N184 Chronic kidney disease, stage 4 (severe): Secondary | ICD-10-CM

## 2021-04-12 DIAGNOSIS — E782 Mixed hyperlipidemia: Secondary | ICD-10-CM | POA: Diagnosis present

## 2021-04-12 DIAGNOSIS — I10 Essential (primary) hypertension: Secondary | ICD-10-CM | POA: Diagnosis not present

## 2021-04-12 DIAGNOSIS — A419 Sepsis, unspecified organism: Secondary | ICD-10-CM | POA: Diagnosis not present

## 2021-04-12 DIAGNOSIS — G9341 Metabolic encephalopathy: Secondary | ICD-10-CM | POA: Diagnosis present

## 2021-04-12 DIAGNOSIS — N189 Chronic kidney disease, unspecified: Secondary | ICD-10-CM | POA: Diagnosis not present

## 2021-04-12 DIAGNOSIS — Z8616 Personal history of COVID-19: Secondary | ICD-10-CM | POA: Diagnosis not present

## 2021-04-12 DIAGNOSIS — Z794 Long term (current) use of insulin: Secondary | ICD-10-CM | POA: Diagnosis not present

## 2021-04-12 DIAGNOSIS — I471 Supraventricular tachycardia: Secondary | ICD-10-CM | POA: Diagnosis present

## 2021-04-12 DIAGNOSIS — E872 Acidosis: Secondary | ICD-10-CM | POA: Diagnosis present

## 2021-04-12 DIAGNOSIS — D631 Anemia in chronic kidney disease: Secondary | ICD-10-CM | POA: Diagnosis present

## 2021-04-12 DIAGNOSIS — Z96643 Presence of artificial hip joint, bilateral: Secondary | ICD-10-CM | POA: Diagnosis present

## 2021-04-12 DIAGNOSIS — Z8546 Personal history of malignant neoplasm of prostate: Secondary | ICD-10-CM | POA: Diagnosis not present

## 2021-04-12 DIAGNOSIS — Z992 Dependence on renal dialysis: Secondary | ICD-10-CM | POA: Diagnosis not present

## 2021-04-12 DIAGNOSIS — Z8249 Family history of ischemic heart disease and other diseases of the circulatory system: Secondary | ICD-10-CM | POA: Diagnosis not present

## 2021-04-12 DIAGNOSIS — I5032 Chronic diastolic (congestive) heart failure: Secondary | ICD-10-CM | POA: Diagnosis present

## 2021-04-12 DIAGNOSIS — N289 Disorder of kidney and ureter, unspecified: Secondary | ICD-10-CM | POA: Diagnosis not present

## 2021-04-12 LAB — IRON AND TIBC
Iron: 23 ug/dL — ABNORMAL LOW (ref 45–182)
Saturation Ratios: 10 % — ABNORMAL LOW (ref 17.9–39.5)
TIBC: 232 ug/dL — ABNORMAL LOW (ref 250–450)
UIBC: 209 ug/dL

## 2021-04-12 LAB — FERRITIN: Ferritin: 127 ng/mL (ref 24–336)

## 2021-04-12 LAB — POCT HEMOGLOBIN-HEMACUE: Hemoglobin: 8.9 g/dL — ABNORMAL LOW (ref 13.0–17.0)

## 2021-04-12 MED ORDER — CEPHALEXIN 500 MG PO CAPS: 500.0000 mg | ORAL_CAPSULE | Freq: Three times a day (TID) | ORAL | 0 refills | Status: DC

## 2021-04-12 MED ORDER — EPOETIN ALFA-EPBX 10000 UNIT/ML IJ SOLN
INTRAMUSCULAR | Status: AC
  Administered 2021-04-12: 10000 [IU] via SUBCUTANEOUS
  Filled 2021-04-12: qty 1

## 2021-04-12 MED ORDER — EPOETIN ALFA-EPBX 10000 UNIT/ML IJ SOLN
10000.0000 [IU] | Freq: Once | INTRAMUSCULAR | Status: AC
Start: 2021-04-12 — End: 2021-04-12

## 2021-04-12 NOTE — Progress Notes (Signed)
Subjective:    Patient ID: Mark Hose., male    DOB: 1941-03-28, 80 y.o.   MRN: [id number]  HPI 04/06/21 Patient presents today with a very complex problem.  He has a very painful rash over his posterior left calf and left thigh.  The rash is a reddish violaceous linear patch.  It appears to be libido reticularis.  It extends all over to the posterior lateral left calf where there is a small black scab consistent with necrotic eschar that is approximately 1.5 cm in diameter.  The rash is tender to the touch.  There is no evidence of secondary infection.  Past medical history is significant for end-stage renal disease having just stopped dialysis.  Rash appears to be consistent with calciphylaxis.  I reviewed his hospital records.  His last phosphate level was 4.0 July 4 of this year.  His last PTH level was 298.  That was May 26 of this year his last calcium level was 7.1 with an albumin of 3.5 which corrects to a calcium level of 7.9.  As result he is on calcitriol in addition to calcium acetate as a phosphate binder.  At that time, my plan was: This presents a diagnostic challenge.  The patient has hypocalcemia even when corrected.  Therefore he is on a calcium containing phosphate binder/PhosLo in addition to calcitriol.  My understanding with calciphylaxis is we need to try to drive his phosphate level down to 3.5 if possible using non calcium containing phosphate binder such as Sevelamer.  We should also consider adding Cinacalcet to achieve a PTH level less than 300 and is close to 150 as possible.  We should avoid vitamin D analogs such as calcitriol however this would certainly complicate his hypocalcemia.  He is not on iron or warfarin.  He is not currently receiving dialysis.  Therefore I would like to run this by his nephrologist (Dr. Royce Macadamia) as I am not sure how the changes that I am discussing above would affect his hypocalcemia.  Addendum: I was able to speak with his nephrologist,  Dr. Royce Macadamia.  I really appreciate her time, her insight, and her assistance.  She is going to work the patient in next week to take a look at the leg and see if this is in fact calciphylaxis.  Tentatively our plan will be to reduce the calcitriol to every other day and switch off the PhosLo.  They will also decide about Sensipar at that visit and discussed dialysis as well.  Again I appreciate her time and her help.  04/12/21 Patient has an appointment to meet with nephrology tomorrow.  However he is taken a turn for the worst over the last 2 days.  Today he is in a wheelchair.  He is much weaker.  He has a hard time even standing to allow me to examine him.  I have to grab him underneath his arm and help pulling to a standing position.  2 days ago he was standing up and walk around the house without any difficulty.  He reports feeling weak and tired.  He also reports dysuria.  He states it burns whenever he urinates.  A urinalysis obtained 2 days ago was completely normal.  However on examination today he has blisters on the head of his penis and around the urethra.  I believe that this is also calciphylaxis similar to what is seen on his legs.  This is certainly causing dysuria but I do not feel that  this is a source of systemic infection that would cause him to decompensate.  Therefore I feel that the patient is most likely suffering from uremia secondary to his renal failure.  We had a long discussion today about going to the emergency room however both the patient and the wife would like to check his lab work first.  There is some erythema on the glans penis surrounding the blisters near the urethral meatus.  I believe that this is gotten secondarily infected so I will start the patient on Keflex and additionally would like to try the antibiotics to see if he will "improve". Past Medical History:  Diagnosis Date   Arthritis    lower back   Chronic back pain    Disc disease   Chronic kidney disease     Coronary atherosclerosis of native coronary artery    Coronary calcifications by chest CT, Myoview demonstrating inferolateral scar   Deafness in right ear    Diabetes mellitus    Diabetic retinopathy (HCC)    moderate nonproliferative with macular edema in right eye   Diastolic dysfunction 04/4502   Grade 1.   Essential hypertension, benign    Heart murmur    in past   HOH (hard of hearing)    Hyperkalemia    Kidney stones    Mixed hyperlipidemia    NAFLD (nonalcoholic fatty liver disease)    Pancreatic insufficiency    Diagnosed at [facility]   Prostate cancer [facility]) 2011   Shortness of breath dyspnea    occasional - liver pressing on right lung, decreased capacity   Subdural hematoma (HCC)    Type 2 diabetes mellitus ([place])    Wears hearing aid    "crossover" form right to left   Past Surgical History:  Procedure Laterality Date   APPENDECTOMY     Bilateral hip replacement     CATARACT EXTRACTION W/PHACO Left 05/15/2015   Procedure: CATARACT EXTRACTION PHACO AND INTRAOCULAR LENS PLACEMENT ([place]);  Surgeon: Ronnell Freshwater, MD;  Location: [place];  Service: Ophthalmology;  Laterality: Left;  DIABETIC - insulin pump and oral meds   CATARACT EXTRACTION W/PHACO Right 08/28/2015   Procedure: CATARACT EXTRACTION PHACO AND INTRAOCULAR LENS PLACEMENT ([facility]);  Surgeon: Ronnell Freshwater, MD;  Location: [place];  Service: Ophthalmology;  Laterality: Right;  DIABETIC PER PT AND CINDY PLEASE KEEP ARRIVAL TIME AFTER 8AM    CHOLECYSTECTOMY     HERNIA REPAIR     INSERTION OF DIALYSIS CATHETER Right 01/22/2021   Procedure: INSERTION OF DIALYSIS CATHETER;  Surgeon: Virl Cagey, MD;  Location: [facility];  Service: General;  Laterality: Right;   PROSTATECTOMY  2011   Current Outpatient Medications on File Prior to Visit  Medication Sig Dispense Refill   aspirin EC 81 MG tablet Take 81 mg by mouth at bedtime.      calcitRIOL (ROCALTROL) 0.25 MCG capsule  Take 1 capsule (0.25 mcg total) by mouth daily. 90 capsule 1   calcium acetate (PHOSLO) 667 MG capsule Take 2 capsules (1,334 mg total) by mouth 3 (three) times daily with meals. 540 capsule 1   Cholecalciferol (DIALYVITE VITAMIN D 5000 PO) Take by mouth.     diltiazem (CARDIZEM CD) 240 MG 24 hr capsule Take 1 capsule (240 mg total) by mouth daily. 90 capsule 1   furosemide (LASIX) 40 MG tablet Take 40 mg by mouth daily.     insulin aspart (NOVOLOG) 100 UNIT/ML injection Inject 8 Units into the skin 3 (  three) times daily with meals. (Patient taking differently: Inject 10 Units into the skin in the morning.) 10 mL 11   insulin glargine (LANTUS) 100 UNIT/ML injection Inject 0.12 mLs (12 Units total) into the skin at bedtime. 10 mL 11   metoprolol tartrate (LOPRESSOR) 50 MG tablet Take 1 tablet (50 mg total) by mouth 2 (two) times daily. 180 tablet 1   Multiple Vitamin (MULTIVITAMIN WITH MINERALS) TABS Take 1 tablet by mouth daily.     pravastatin (PRAVACHOL) 10 MG tablet TAKE 1 TABLET(10 MG) BY MOUTH DAILY WITH BREAKFAST 90 tablet 1   sodium bicarbonate 650 MG tablet Take 650 mg by mouth daily.     ZENPEP 10000-32000 units CPEP TAKE 1 CAPSULE BY MOUTH THREE TIMES DAILY BEFORE MEALS 270 capsule 2   No current facility-administered medications on file prior to visit.   Marland Kitchenall Social History   Socioeconomic History   Marital status: Married    Spouse name: Not on file   Number of children: Not on file   Years of education: Not on file   Highest education level: Not on file  Occupational History   Not on file  Tobacco Use   Smoking status: Never   Smokeless tobacco: Never  Substance and Sexual Activity   Alcohol use: No   Drug use: No   Sexual activity: Not on file  Other Topics Concern   Not on file  Social History Narrative   Not on file   Social Determinants of Health   Financial Resource Strain: Not on file  Food Insecurity: Not on file  Transportation Needs: Not on file   Physical Activity: Not on file  Stress: Not on file  Social Connections: Not on file  Intimate Partner Violence: Not on file      Review of Systems  All other systems reviewed and are negative.     Objective:   Physical Exam Constitutional:      General: He is not in acute distress.    Appearance: Normal appearance. He is normal weight. He is not ill-appearing or toxic-appearing.  Cardiovascular:     Rate and Rhythm: Normal rate. Rhythm irregular.     Heart sounds: Normal heart sounds.  Pulmonary:     Effort: Pulmonary effort is normal.     Breath sounds: Rales present.  Abdominal:     General: Abdomen is flat. Bowel sounds are normal.     Palpations: Abdomen is soft.  Musculoskeletal:     Right lower leg: No edema.     Left lower leg: No edema.  Skin:    Findings: Erythema and rash present.       Neurological:     Mental Status: He is alert.        Assessment & Plan:  Acute renal failure superimposed on stage 4 chronic kidney disease, unspecified acute renal failure type ([place]) - Plan: CBC with Differential/Platelet, COMPLETE METABOLIC PANEL WITH GFR  Calciphylaxis of lower extremity with nonhealing ulcer, limited to breakdown of skin ([place]) I believe that the patient is likely showing signs of uremia coupled with calciphylaxis and the reason for his decompensation is likely due to the uremia.  He does appear to have some mild cellulitis around the glans penis related to calciphylaxis and ulcer formation.  This is causing dysuria.  I will treat this with Keflex 500 mg p.o. 3 times daily.  There was no evidence of urinary tract infection on his last urinalysis.  However I explained to the patient  and his wife that I do not feel this infection is serious enough to cause him to decompensate.  I believe this is most likely due to acute renal failure likely requiring dialysis and uremia.  I recommended they go to the emergency room for urgent lab work.  They declined and  request that I check lab work here and if worsening they will go to the emergency room either tonight or tomorrow based on the lab work.

## 2021-04-13 ENCOUNTER — Inpatient Hospital Stay (HOSPITAL_COMMUNITY)
Admission: EM | Admit: 2021-04-13 | Discharge: 2021-04-18 | DRG: 673 | Disposition: A | Discharge status: Home Health | Payer: PPO | Attending: Family Medicine | Admitting: Family Medicine

## 2021-04-13 ENCOUNTER — Emergency Department (HOSPITAL_COMMUNITY): Payer: PPO

## 2021-04-13 ENCOUNTER — Inpatient Hospital Stay (HOSPITAL_COMMUNITY): Payer: PPO

## 2021-04-13 ENCOUNTER — Encounter (HOSPITAL_COMMUNITY): Payer: Self-pay | Admitting: *Deleted

## 2021-04-13 DIAGNOSIS — E872 Acidosis: Secondary | ICD-10-CM | POA: Diagnosis present

## 2021-04-13 DIAGNOSIS — N179 Acute kidney failure, unspecified: Principal | ICD-10-CM | POA: Diagnosis present

## 2021-04-13 DIAGNOSIS — N186 End stage renal disease: Secondary | ICD-10-CM | POA: Diagnosis not present

## 2021-04-13 DIAGNOSIS — Z79899 Other long term (current) drug therapy: Secondary | ICD-10-CM

## 2021-04-13 DIAGNOSIS — Z992 Dependence on renal dialysis: Secondary | ICD-10-CM

## 2021-04-13 DIAGNOSIS — B9561 Methicillin susceptible Staphylococcus aureus infection as the cause of diseases classified elsewhere: Secondary | ICD-10-CM | POA: Diagnosis present

## 2021-04-13 DIAGNOSIS — D631 Anemia in chronic kidney disease: Secondary | ICD-10-CM | POA: Diagnosis present

## 2021-04-13 DIAGNOSIS — I471 Supraventricular tachycardia: Secondary | ICD-10-CM | POA: Diagnosis present

## 2021-04-13 DIAGNOSIS — Z8616 Personal history of COVID-19: Secondary | ICD-10-CM

## 2021-04-13 DIAGNOSIS — N189 Chronic kidney disease, unspecified: Secondary | ICD-10-CM | POA: Diagnosis not present

## 2021-04-13 DIAGNOSIS — H9190 Unspecified hearing loss, unspecified ear: Secondary | ICD-10-CM

## 2021-04-13 DIAGNOSIS — I5032 Chronic diastolic (congestive) heart failure: Secondary | ICD-10-CM | POA: Diagnosis present

## 2021-04-13 DIAGNOSIS — R531 Weakness: Secondary | ICD-10-CM | POA: Diagnosis present

## 2021-04-13 DIAGNOSIS — Z96643 Presence of artificial hip joint, bilateral: Secondary | ICD-10-CM | POA: Diagnosis present

## 2021-04-13 DIAGNOSIS — Z8546 Personal history of malignant neoplasm of prostate: Secondary | ICD-10-CM | POA: Diagnosis not present

## 2021-04-13 DIAGNOSIS — Z8249 Family history of ischemic heart disease and other diseases of the circulatory system: Secondary | ICD-10-CM

## 2021-04-13 DIAGNOSIS — N390 Urinary tract infection, site not specified: Secondary | ICD-10-CM | POA: Diagnosis present

## 2021-04-13 DIAGNOSIS — L89152 Pressure ulcer of sacral region, stage 2: Secondary | ICD-10-CM | POA: Diagnosis present

## 2021-04-13 DIAGNOSIS — Z9079 Acquired absence of other genital organ(s): Secondary | ICD-10-CM

## 2021-04-13 DIAGNOSIS — I2510 Atherosclerotic heart disease of native coronary artery without angina pectoris: Secondary | ICD-10-CM | POA: Diagnosis present

## 2021-04-13 DIAGNOSIS — E1122 Type 2 diabetes mellitus with diabetic chronic kidney disease: Secondary | ICD-10-CM | POA: Diagnosis present

## 2021-04-13 DIAGNOSIS — E782 Mixed hyperlipidemia: Secondary | ICD-10-CM | POA: Diagnosis present

## 2021-04-13 DIAGNOSIS — Z833 Family history of diabetes mellitus: Secondary | ICD-10-CM

## 2021-04-13 DIAGNOSIS — Z794 Long term (current) use of insulin: Secondary | ICD-10-CM

## 2021-04-13 DIAGNOSIS — K760 Fatty (change of) liver, not elsewhere classified: Secondary | ICD-10-CM | POA: Diagnosis present

## 2021-04-13 DIAGNOSIS — I132 Hypertensive heart and chronic kidney disease with heart failure and with stage 5 chronic kidney disease, or end stage renal disease: Secondary | ICD-10-CM | POA: Diagnosis present

## 2021-04-13 DIAGNOSIS — H833X1 Noise effects on right inner ear: Secondary | ICD-10-CM | POA: Diagnosis not present

## 2021-04-13 DIAGNOSIS — G9341 Metabolic encephalopathy: Secondary | ICD-10-CM | POA: Diagnosis present

## 2021-04-13 DIAGNOSIS — E1142 Type 2 diabetes mellitus with diabetic polyneuropathy: Secondary | ICD-10-CM | POA: Diagnosis present

## 2021-04-13 DIAGNOSIS — N2581 Secondary hyperparathyroidism of renal origin: Secondary | ICD-10-CM | POA: Diagnosis present

## 2021-04-13 DIAGNOSIS — Z7982 Long term (current) use of aspirin: Secondary | ICD-10-CM

## 2021-04-13 DIAGNOSIS — E43 Unspecified severe protein-calorie malnutrition: Secondary | ICD-10-CM | POA: Diagnosis present

## 2021-04-13 DIAGNOSIS — E876 Hypokalemia: Secondary | ICD-10-CM | POA: Diagnosis present

## 2021-04-13 DIAGNOSIS — N289 Disorder of kidney and ureter, unspecified: Secondary | ICD-10-CM

## 2021-04-13 DIAGNOSIS — B952 Enterococcus as the cause of diseases classified elsewhere: Secondary | ICD-10-CM | POA: Diagnosis present

## 2021-04-13 DIAGNOSIS — E860 Dehydration: Secondary | ICD-10-CM | POA: Diagnosis present

## 2021-04-13 DIAGNOSIS — L89322 Pressure ulcer of left buttock, stage 2: Secondary | ICD-10-CM | POA: Diagnosis present

## 2021-04-13 DIAGNOSIS — E1165 Type 2 diabetes mellitus with hyperglycemia: Secondary | ICD-10-CM | POA: Diagnosis present

## 2021-04-13 LAB — CBC WITH DIFFERENTIAL/PLATELET
Abs Immature Granulocytes: 0.14 10*3/uL — ABNORMAL HIGH (ref 0.00–0.07)
Absolute Monocytes: 1035 cells/uL — ABNORMAL HIGH (ref 200–950)
Basophils Absolute: 35 cells/uL (ref 0–200)
Basophils Absolute: 0.1 10*3/uL (ref 0.0–0.1)
Basophils Relative: 0.3 %
Basophils Relative: 0 %
Eosinophils Absolute: 115 cells/uL (ref 15–500)
Eosinophils Absolute: 0.1 10*3/uL (ref 0.0–0.5)
Eosinophils Relative: 1 %
Eosinophils Relative: 1 %
HCT: 29.2 % — ABNORMAL LOW (ref 38.5–50.0)
HCT: 28.5 % — ABNORMAL LOW (ref 39.0–52.0)
Hemoglobin: 9 g/dL — ABNORMAL LOW (ref 13.2–17.1)
Hemoglobin: 8.8 g/dL — ABNORMAL LOW (ref 13.0–17.0)
Immature Granulocytes: 1 %
Lymphocytes Relative: 10 %
Lymphs Abs: 1392 cells/uL (ref 850–3900)
Lymphs Abs: 1.3 10*3/uL (ref 0.7–4.0)
MCH: 30.1 pg (ref 27.0–33.0)
MCH: 31.2 pg (ref 26.0–34.0)
MCHC: 30.8 g/dL — ABNORMAL LOW (ref 32.0–36.0)
MCHC: 30.9 g/dL (ref 30.0–36.0)
MCV: 97.7 fL (ref 80.0–100.0)
MCV: 101.1 fL — ABNORMAL HIGH (ref 80.0–100.0)
MPV: 10.1 fL (ref 7.5–12.5)
Monocytes Absolute: 1 10*3/uL (ref 0.1–1.0)
Monocytes Relative: 9 %
Monocytes Relative: 8 %
Neutro Abs: 8924 cells/uL — ABNORMAL HIGH (ref 1500–7800)
Neutro Abs: 10.5 10*3/uL — ABNORMAL HIGH (ref 1.7–7.7)
Neutrophils Relative %: 77.6 %
Neutrophils Relative %: 80 %
Platelets: 231 10*3/uL (ref 140–400)
Platelets: 247 10*3/uL (ref 150–400)
RBC: 2.99 10*6/uL — ABNORMAL LOW (ref 4.20–5.80)
RBC: 2.82 MIL/uL — ABNORMAL LOW (ref 4.22–5.81)
RDW: 14.7 % (ref 11.0–15.0)
RDW: 15.9 % — ABNORMAL HIGH (ref 11.5–15.5)
Total Lymphocyte: 12.1 %
WBC: 11.5 10*3/uL — ABNORMAL HIGH (ref 3.8–10.8)
WBC: 13.1 10*3/uL — ABNORMAL HIGH (ref 4.0–10.5)
nRBC: 0.2 % (ref 0.0–0.2)

## 2021-04-13 LAB — URINALYSIS, ROUTINE W REFLEX MICROSCOPIC
Bilirubin Urine: NEGATIVE
Glucose, UA: NEGATIVE mg/dL
Ketones, ur: NEGATIVE mg/dL
Leukocytes,Ua: NEGATIVE
Nitrite: NEGATIVE
Protein, ur: 30 mg/dL — AB
Specific Gravity, Urine: >1.03 — ABNORMAL HIGH (ref 1.005–1.030)
pH: 5 (ref 5.0–8.0)

## 2021-04-13 LAB — COMPLETE METABOLIC PANEL WITH GFR
AG Ratio: 1.1 (calc) (ref 1.0–2.5)
ALT: 16 U/L (ref 9–46)
AST: 13 U/L (ref 10–35)
Albumin: 3.3 g/dL — ABNORMAL LOW (ref 3.6–5.1)
Alkaline phosphatase (APISO): 137 U/L (ref 35–144)
BUN/Creatinine Ratio: 19 (calc) (ref 6–22)
BUN: 96 mg/dL — ABNORMAL HIGH (ref 7–25)
CO2: <10 mmol/L — ABNORMAL LOW (ref 20–32)
Calcium: 7.1 mg/dL — ABNORMAL LOW (ref 8.6–10.3)
Chloride: 116 mmol/L — ABNORMAL HIGH (ref 98–110)
Creat: 5 mg/dL — ABNORMAL HIGH (ref 0.70–1.22)
Globulin: 2.9 g/dL (calc) (ref 1.9–3.7)
Glucose, Bld: 274 mg/dL — ABNORMAL HIGH (ref 65–99)
Potassium: 4.3 mmol/L (ref 3.5–5.3)
Sodium: 140 mmol/L (ref 135–146)
Total Bilirubin: 0.3 mg/dL (ref 0.2–1.2)
Total Protein: 6.2 g/dL (ref 6.1–8.1)
eGFR: 11 mL/min/{1.73_m2} — ABNORMAL LOW (ref >=60)

## 2021-04-13 LAB — URINALYSIS, MICROSCOPIC (REFLEX): Bacteria, UA: NONE SEEN

## 2021-04-13 LAB — GLUCOSE, CAPILLARY
Glucose-Capillary: 66 mg/dL — ABNORMAL LOW (ref 70–99)
Glucose-Capillary: 87 mg/dL (ref 70–99)

## 2021-04-13 LAB — COMPREHENSIVE METABOLIC PANEL
ALT: 19 U/L (ref 0–44)
AST: 14 U/L — ABNORMAL LOW (ref 15–41)
Albumin: 2.6 g/dL — ABNORMAL LOW (ref 3.5–5.0)
Alkaline Phosphatase: 134 U/L — ABNORMAL HIGH (ref 38–126)
Anion gap: 14 (ref 5–15)
BUN: 112 mg/dL — ABNORMAL HIGH (ref 8–23)
CO2: 8 mmol/L — ABNORMAL LOW (ref 22–32)
Calcium: 7 mg/dL — ABNORMAL LOW (ref 8.9–10.3)
Chloride: 117 mmol/L — ABNORMAL HIGH (ref 98–111)
Creatinine, Ser: 5.25 mg/dL — ABNORMAL HIGH (ref 0.61–1.24)
GFR, Estimated: 10 mL/min — ABNORMAL LOW (ref >60)
Glucose, Bld: 160 mg/dL — ABNORMAL HIGH (ref 70–99)
Potassium: 4.1 mmol/L (ref 3.5–5.1)
Sodium: 139 mmol/L (ref 135–145)
Total Bilirubin: 0.2 mg/dL — ABNORMAL LOW (ref 0.3–1.2)
Total Protein: 6.7 g/dL (ref 6.5–8.1)

## 2021-04-13 LAB — RESP PANEL BY RT-PCR (FLU A&B, COVID) ARPGX2
Influenza A by PCR: NEGATIVE
Influenza B by PCR: NEGATIVE
SARS Coronavirus 2 by RT PCR: NEGATIVE

## 2021-04-13 LAB — LACTIC ACID, PLASMA
Lactic Acid, Venous: 0.7 mmol/L (ref 0.5–1.9)
Lactic Acid, Venous: 1.2 mmol/L (ref 0.5–1.9)

## 2021-04-13 LAB — APTT: aPTT: 37 seconds — ABNORMAL HIGH (ref 24–36)

## 2021-04-13 LAB — PROTIME-INR
INR: 1.5 — ABNORMAL HIGH (ref 0.8–1.2)
Prothrombin Time: 18 seconds — ABNORMAL HIGH (ref 11.4–15.2)

## 2021-04-13 LAB — BRAIN NATRIURETIC PEPTIDE: B Natriuretic Peptide: 700 pg/mL — ABNORMAL HIGH (ref 0.0–100.0)

## 2021-04-13 MED ORDER — SODIUM CHLORIDE 0.9 % IV SOLN
2.0000 g | INTRAVENOUS | Status: DC
  Administered 2021-04-13 – 2021-04-15 (×3): 2 g via INTRAVENOUS
  Filled 2021-04-13 (×3): qty 20

## 2021-04-13 MED ORDER — INSULIN GLARGINE-YFGN 100 UNIT/ML ~~LOC~~ SOLN
10.0000 [IU] | Freq: Every day | SUBCUTANEOUS | Status: DC
  Filled 2021-04-13 (×3): qty 0.1

## 2021-04-13 MED ORDER — PANCRELIPASE (LIP-PROT-AMYL) 12000-38000 UNITS PO CPEP
12000.0000 [IU] | ORAL_CAPSULE | Freq: Three times a day (TID) | ORAL | Status: DC
  Administered 2021-04-14 – 2021-04-18 (×8): 12000 [IU] via ORAL
  Filled 2021-04-13 (×17): qty 1

## 2021-04-13 MED ORDER — ASPIRIN EC 81 MG PO TBEC
81.0000 mg | DELAYED_RELEASE_TABLET | Freq: Every day | ORAL | Status: DC
  Administered 2021-04-14 – 2021-04-18 (×4): 81 mg via ORAL
  Filled 2021-04-13 (×5): qty 1

## 2021-04-13 MED ORDER — LACTATED RINGERS IV BOLUS (SEPSIS): 1000.0000 mL | Freq: Once | INTRAVENOUS | Status: DC

## 2021-04-13 MED ORDER — SODIUM CHLORIDE 0.9% FLUSH
3.0000 mL | Freq: Two times a day (BID) | INTRAVENOUS | Status: DC
  Administered 2021-04-14 – 2021-04-17 (×4): 3 mL via INTRAVENOUS

## 2021-04-13 MED ORDER — ACETAMINOPHEN 325 MG PO TABS
650.0000 mg | ORAL_TABLET | Freq: Four times a day (QID) | ORAL | Status: DC | PRN
  Administered 2021-04-15 – 2021-04-18 (×4): 650 mg via ORAL
  Filled 2021-04-13 (×4): qty 2

## 2021-04-13 MED ORDER — SODIUM CHLORIDE 0.9% FLUSH
3.0000 mL | Freq: Two times a day (BID) | INTRAVENOUS | Status: DC
  Administered 2021-04-14 – 2021-04-18 (×7): 3 mL via INTRAVENOUS

## 2021-04-13 MED ORDER — LACTATED RINGERS IV BOLUS (SEPSIS)
1000.0000 mL | Freq: Once | INTRAVENOUS | Status: AC
Start: 2021-04-13 — End: 2021-04-13
  Administered 2021-04-13: 1000 mL via INTRAVENOUS

## 2021-04-13 MED ORDER — ONDANSETRON HCL 4 MG PO TABS: 4.0000 mg | ORAL_TABLET | Freq: Four times a day (QID) | ORAL | Status: DC | PRN

## 2021-04-13 MED ORDER — SODIUM BICARBONATE 650 MG PO TABS
650.0000 mg | ORAL_TABLET | Freq: Three times a day (TID) | ORAL | Status: DC
  Administered 2021-04-13 – 2021-04-14 (×2): 650 mg via ORAL
  Filled 2021-04-13 (×3): qty 1

## 2021-04-13 MED ORDER — HEPARIN SODIUM (PORCINE) 5000 UNIT/ML IJ SOLN
5000.0000 [IU] | Freq: Three times a day (TID) | INTRAMUSCULAR | Status: DC
  Administered 2021-04-13 – 2021-04-18 (×12): 5000 [IU] via SUBCUTANEOUS
  Filled 2021-04-13 (×12): qty 1

## 2021-04-13 MED ORDER — CALCITRIOL 0.25 MCG PO CAPS
0.2500 ug | ORAL_CAPSULE | Freq: Every day | ORAL | Status: DC
  Administered 2021-04-14 – 2021-04-17 (×3): 0.25 ug via ORAL
  Filled 2021-04-13 (×5): qty 1

## 2021-04-13 MED ORDER — ONDANSETRON HCL 4 MG/2ML IJ SOLN: 4.0000 mg | Freq: Four times a day (QID) | INTRAMUSCULAR | Status: DC | PRN

## 2021-04-13 MED ORDER — LACTATED RINGERS IV BOLUS (SEPSIS)
500.0000 mL | Freq: Once | INTRAVENOUS | Status: AC
  Administered 2021-04-13: 500 mL via INTRAVENOUS

## 2021-04-13 MED ORDER — LACTATED RINGERS IV BOLUS
1000.0000 mL | Freq: Once | INTRAVENOUS | Status: AC
  Administered 2021-04-13: 1000 mL via INTRAVENOUS

## 2021-04-13 MED ORDER — CALCIUM ACETATE (PHOS BINDER) 667 MG PO CAPS
1334.0000 mg | ORAL_CAPSULE | Freq: Three times a day (TID) | ORAL | Status: DC
  Administered 2021-04-14 – 2021-04-17 (×5): 1334 mg via ORAL
  Filled 2021-04-13 (×9): qty 2

## 2021-04-13 MED ORDER — ACETAMINOPHEN 650 MG RE SUPP: 650.0000 mg | Freq: Four times a day (QID) | RECTAL | Status: DC | PRN

## 2021-04-13 MED ORDER — SODIUM CHLORIDE 0.9% FLUSH: 3.0000 mL | INTRAVENOUS | Status: DC | PRN

## 2021-04-13 MED ORDER — ADULT MULTIVITAMIN W/MINERALS CH
1.0000 | ORAL_TABLET | Freq: Every day | ORAL | Status: DC
  Administered 2021-04-15 – 2021-04-18 (×3): 1 via ORAL
  Filled 2021-04-13 (×5): qty 1

## 2021-04-13 MED ORDER — LACTATED RINGERS IV SOLN
INTRAVENOUS | Status: DC
  Administered 2021-04-13: 500 mL via INTRAVENOUS

## 2021-04-13 MED ORDER — TRAZODONE HCL 50 MG PO TABS: 50.0000 mg | ORAL_TABLET | Freq: Every evening | ORAL | Status: DC | PRN

## 2021-04-13 MED ORDER — BISACODYL 10 MG RE SUPP: 10.0000 mg | Freq: Every day | RECTAL | Status: DC | PRN

## 2021-04-13 MED ORDER — SODIUM CHLORIDE 0.45 % IV SOLN
INTRAVENOUS | Status: DC
  Filled 2021-04-13 (×9): qty 75

## 2021-04-13 MED ORDER — SODIUM CHLORIDE 0.9 % IV SOLN: 250.0000 mL | INTRAVENOUS | Status: DC | PRN

## 2021-04-13 MED ORDER — POLYETHYLENE GLYCOL 3350 17 G PO PACK: 17.0000 g | PACK | Freq: Every day | ORAL | Status: DC | PRN

## 2021-04-13 NOTE — Progress Notes (Signed)
Patient admitted from ED.  First set of vital signs showed a BP of 96/41 (58) and tachypnea 22-28.  Patient's oxygen saturation is 100% on room air.  He is using his abdominal muscles with respiration.  He is wheezing.  Additionally, his blood sugar was 66.  Juice given.  Dr. Josephine Cables notified of vitals and assessment.  At noon today his BNP was 700. BUN and creatinine 112 and 5.25.

## 2021-04-13 NOTE — ED Provider Notes (Addendum)
[facility] EMERGENCY DEPARTMENT Provider Note   CSN: [id number] Arrival date & time: 04/13/21  1002     History Chief Complaint  Patient presents with   Weakness    Tired for a couple of months    Mark L Theissen Sr. is a 80 y.o. male.   Weakness Associated symptoms: no abdominal pain, no chest pain, no cough, no diarrhea, no dizziness, no dysuria, no fever, no nausea, no shortness of breath and no vomiting    Patient presents with weakness.  Patient was hospitalized after COVID exacerbation for roughly 3 months.  During that time he required dialysis and and is the process of starting dialysis outpatient with nephrology.  Patient was released in the hospital about 3 weeks ago and did well with rehab for 2 weeks.  He returned home and about 4 days ago he started acting weak.  Patient is eating and drinking less, he has been acting like not himself according to his wife.  He is not having any chest pain or shortness of breath, but he is sleeping more than normal.  He is also been having some mild dysuria.  Patient was seen at his urologist office yesterday.  He started the patient on Keflex and advised him to go to the emergency department for additional evaluation for decompensation.  Family doctor was concerned about acute renal failure and uremia, but family declined at that time stating that they wanted to get labs checked outpatient before returning back to the hospital.  PCP note from yesterday: "I believe that the patient is likely showing signs of uremia coupled with calciphylaxis and the reason for his decompensation is likely due to the uremia.  He does appear to have some mild cellulitis around the glans penis related to calciphylaxis and ulcer formation.  This is causing dysuria.  I will treat this with Keflex 500 mg p.o. 3 times daily.  There was no evidence of urinary tract infection on his last urinalysis.  However I explained to the patient and his wife that I do not feel  this infection is serious enough to cause him to decompensate.  I believe this is most likely due to acute renal failure likely requiring dialysis and uremia."  Past Medical History:  Diagnosis Date   Arthritis    lower back   Chronic back pain    Disc disease   Chronic kidney disease    Coronary atherosclerosis of native coronary artery    Coronary calcifications by chest CT, Myoview demonstrating inferolateral scar   Deafness in right ear    Diabetes mellitus    Diabetic retinopathy (HCC)    moderate nonproliferative with macular edema in right eye   Diastolic dysfunction 08/930   Grade 1.   Essential hypertension, benign    Heart murmur    in past   HOH (hard of hearing)    Hyperkalemia    Kidney stones    Mixed hyperlipidemia    NAFLD (nonalcoholic fatty liver disease)    Pancreatic insufficiency    Diagnosed at [facility]   Prostate cancer [facility]) 2011   Shortness of breath dyspnea    occasional - liver pressing on right lung, decreased capacity   Subdural hematoma (HCC)    Type 2 diabetes mellitus ([place])    Wears hearing aid    "crossover" form right to left    Patient Active Problem List   Diagnosis Date Noted   Metabolic encephalopathy 35/57/3220   ESRD (end stage renal disease) ([place])  02/02/2021   Hypoglycemia 02/02/2021   Sepsis ([place]) 02/02/2021   Acute lower UTI 02/02/2021   Vascular dialysis catheter in place [facility])    Shingles 01/14/2021   Pressure injury of skin 01/09/2021   SVT (supraventricular tachycardia) ([name]) 01/07/2021   Acute renal failure superimposed on stage 3b chronic kidney disease ([place]) 88/89/1694   Acute metabolic encephalopathy 50/38/8828   COVID-19 virus infection 00/34/9179   Metabolic acidosis    AKI (acute kidney injury) ([place]) 12/23/2020   Hyperkalemia 12/02/2018   Fever, unknown origin 12/02/2018   Altered mental state 12/02/2018   Acute on chronic renal failure ([place]) 12/02/2018   Pancreatic insufficiency 12/02/2018   Wears hearing  aid 12/02/2018   HOH (hard of hearing) 12/02/2018   DM type 2 with diabetic peripheral neuropathy ([place]) 09/15/2018   Uncontrolled type 2 diabetes mellitus with hyperglycemia ([place]) 09/15/2018   Peripheral edema 06/25/2018   NAFLD (nonalcoholic fatty liver disease)    Diabetic retinopathy ([place])    ED (erectile dysfunction) 01/05/2014   Shortness of breath 12/05/2011   Coronary atherosclerosis of native coronary artery 12/05/2011   Type 2 diabetes mellitus ([place]) 12/05/2011   Essential hypertension, benign 12/05/2011   Mixed hyperlipidemia 12/05/2011   Malignant neoplasm of prostate ([place]) 06/26/2011    Past Surgical History:  Procedure Laterality Date   APPENDECTOMY     Bilateral hip replacement     CATARACT EXTRACTION W/PHACO Left 05/15/2015   Procedure: CATARACT EXTRACTION PHACO AND INTRAOCULAR LENS PLACEMENT ([place]);  Surgeon: Ronnell Freshwater, MD;  Location: [place];  Service: Ophthalmology;  Laterality: Left;  DIABETIC - insulin pump and oral meds   CATARACT EXTRACTION W/PHACO Right 08/28/2015   Procedure: CATARACT EXTRACTION PHACO AND INTRAOCULAR LENS PLACEMENT ([facility]);  Surgeon: Ronnell Freshwater, MD;  Location: [place];  Service: Ophthalmology;  Laterality: Right;  DIABETIC PER PT AND CINDY PLEASE KEEP ARRIVAL TIME AFTER 8AM    CHOLECYSTECTOMY     HERNIA REPAIR     INSERTION OF DIALYSIS CATHETER Right 01/22/2021   Procedure: INSERTION OF DIALYSIS CATHETER;  Surgeon: Virl Cagey, MD;  Location: [facility];  Service: General;  Laterality: Right;   PROSTATECTOMY  2011       Family History  Problem Relation Age of Onset   Diabetes type II Mother    Heart attack Father     Social History   Tobacco Use   Smoking status: Never   Smokeless tobacco: Never  Substance Use Topics   Alcohol use: No   Drug use: No    Home Medications Prior to Admission medications   Medication Sig Start Date End Date Taking? Authorizing Provider  aspirin  EC 81 MG tablet Take 81 mg by mouth at bedtime.    Yes [provider]  diltiazem (CARDIZEM CD) 240 MG 24 hr capsule Take 1 capsule (240 mg total) by mouth daily. 03/20/21  Yes [name], MD  furosemide (LASIX) 40 MG tablet Take 40 mg by mouth daily. 03/22/21  Yes [provider]  insulin aspart (NOVOLOG) 100 UNIT/ML injection Inject 8 Units into the skin 3 (three) times daily with meals. Patient taking differently: Inject 10 Units into the skin in the morning. 02/06/21  Yes Shah, Pratik D, DO  insulin glargine (LANTUS) 100 UNIT/ML injection Inject 0.12 mLs (12 Units total) into the skin at bedtime. 03/20/21  Yes [name], MD  metoprolol tartrate (LOPRESSOR) 50 MG tablet Take 1 tablet (50 mg total) by mouth 2 (two) times  daily. 03/20/21  Yes [name], MD  Multiple Vitamin (MULTIVITAMIN WITH MINERALS) TABS Take 1 tablet by mouth daily.   Yes [provider]  pravastatin (PRAVACHOL) 10 MG tablet TAKE 1 TABLET(10 MG) BY MOUTH DAILY WITH BREAKFAST Patient taking differently: Take 10 mg by mouth daily. 03/20/21  Yes [name], MD  sodium bicarbonate 650 MG tablet Take 650 mg by mouth daily.   Yes [provider]  ZENPEP 10000-32000 units CPEP TAKE 1 CAPSULE BY MOUTH THREE TIMES DAILY BEFORE MEALS Patient taking differently: Take 1 capsule by mouth with breakfast, with lunch, and with evening meal. 05/10/20  Yes [name], MD  calcitRIOL (ROCALTROL) 0.25 MCG capsule Take 1 capsule (0.25 mcg total) by mouth daily. Patient not taking: No sig reported 03/20/21   [name], MD  calcium acetate (PHOSLO) 667 MG capsule Take 2 capsules (1,334 mg total) by mouth 3 (three) times daily with meals. Patient not taking: No sig reported 03/20/21   [name], MD  cephALEXin (KEFLEX) 500 MG capsule Take 1 capsule (500 mg total) by mouth 3 (three) times daily. 04/12/21   [name], MD    Allergies    Enalapril maleate  Review  of Systems   Review of Systems  Constitutional:  Positive for fatigue. Negative for fever.  Respiratory:  Negative for cough and shortness of breath.   Cardiovascular:  Positive for leg swelling. Negative for chest pain.  Gastrointestinal:  Negative for abdominal pain, diarrhea, nausea and vomiting.  Genitourinary:  Negative for dysuria.  Neurological:  Positive for weakness. Negative for dizziness, syncope and light-headedness.  Psychiatric/Behavioral:  Positive for confusion.    Physical Exam Updated Vital Signs BP (!) 97/56   Pulse (!) 102   Temp 98 F (36.7 C)   Resp (!) 24   Ht 5' 8"  (1.727 m)   Wt 80.7 kg   SpO2 97%   BMI 27.05 kg/m   Physical Exam Vitals and nursing note reviewed. Exam conducted with a chaperone present.  Constitutional:      Appearance: Normal appearance.     Comments: Patient appears frail  HENT:     Head: Normocephalic and atraumatic.     Mouth/Throat:     Mouth: Mucous membranes are dry.  Eyes:     General: No scleral icterus.       Right eye: No discharge.        Left eye: No discharge.     Extraocular Movements: Extraocular movements intact.     Pupils: Pupils are equal, round, and reactive to light.  Cardiovascular:     Rate and Rhythm: Regular rhythm. Bradycardia present.     Pulses: Normal pulses.     Heart sounds: Normal heart sounds. No murmur heard.   No friction rub. No gallop.  Pulmonary:     Effort: Pulmonary effort is normal. No respiratory distress.     Breath sounds: Rales present.     Comments: Patient is tachypneic, there are rales on exam.  No accessory muscle use, he is satting at 97% on room air. Abdominal:     General: Abdomen is flat. Bowel sounds are normal. There is no distension.     Palpations: Abdomen is soft.     Tenderness: There is no abdominal tenderness.  Musculoskeletal:        General: Swelling present.     Comments: 1+ pitting edema to the legs bilaterally  Skin:    General: Skin is warm  and dry.      Coloration: Skin is not jaundiced.  Neurological:     Mental Status: He is alert. Mental status is at baseline.     Coordination: Coordination normal.    ED Results / Procedures / Treatments   Labs (all labs ordered are listed, but only abnormal results are displayed) Labs Reviewed  CBC WITH DIFFERENTIAL/PLATELET  COMPREHENSIVE METABOLIC PANEL  URINALYSIS, ROUTINE W REFLEX MICROSCOPIC    EKG None  Radiology No results found.  Procedures Procedures   Medications Ordered in ED Medications - No data to display  ED Course  I have reviewed the triage vital signs and the nursing notes.  Pertinent labs & imaging results that were available during my care of the patient were reviewed by me and considered in my medical decision making (see chart for details).    MDM Rules/Calculators/A&P                           Patient is bradycardic, hypotensive, and tachypneic.  He is unwell appearing, history of recent renal impairment.  Patient has likely renal failure judging by his lab work completed 2 days ago.  We will repeat now, the patient will likely need to be admitted for renal failure and sepsis presentation.  He does have impaired renal function, but he is also hypotensive and dehydrated.  I discussed this with my attending Dr. Stark Klein and he advised start giving t fluid resuscitation.  We will also give him Rocephin given that he was recently having UTI symptoms and findings consistent with possible cellulitis at his PCP office yesterday.  Patient has renal failure.  He also is hypotensive with tachypnea.  Patient will need to be admitted for renal impairment.  He is mentating appropriately, although his wife does say he is acting somewhat differently.  Potassium levels are normal, I do not think he requires emergent dialysis at this time.  I will put in consult for hospitalist.  Spoke with Dr. Denton Brick who will admit he patient.   Final Clinical Impression(s) / ED Diagnoses Final  diagnoses:  None    Rx / DC Orders ED Discharge Orders     None        Sherrill Raring, PA-C 04/13/21 1357    Sherrill Raring, PA-C 04/13/21 1404    Milton Ferguson, MD 04/14/21 1326

## 2021-04-13 NOTE — H&P (Signed)
Patient Demographics:    [name], is a 80 y.o. male  MRN: [id number]   DOB - 11-05-1940  Admit Date - 04/13/2021  Outpatient Primary MD for the patient is [name], MD   Assessment & Plan:    Active Problems:   AKI (acute kidney injury) ([place])    1)AKI on CKD stage IIIb-patient was treated with hemodialysis from 12/29/2020 through early part of July 2022 -He was taken off hemodialysis due to renal recovery -Renal function appears to be worsening again with worsening metabolic acidosis --Creatinine is up to 5.25 from 3.16 on 04/06/2021, BUN is up to 112 from 52 on 03/26/2021 --Renal ultrasound suggest possible cystitis, but no hydronephrosis-UA is not suggestive of UTI -Patient is voiding well -No hyperkalemia discussed with on-call nephrologist Dr. Lawson Radar -He recommends bicarb drip for now with further reevaluation,  -As per nephrology no plans for emergent hemodialysis at this time   2)Acute metabolic encephalopathy-much improved and stable. --confusion and disorientation from time to time most likely due to #1 above.   Hypocalcemia- -Continue replacement  -Continue calcitriol and cholecalciferol.      Anemia in CKD-  -ESA/Epogen per nephrology team -Follow hemoglobin trend and further transfuse as clinically indicated.    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 fib with RVR-hold Cardizem and  hold Lopressor due to soft BP  Coronary artery disease -no chest pain symptoms at this time.   -Continue aspirin,  -Hold metoprolol due to soft BP   Disposition/Need for in-Hospital Stay- patient unable to be discharged at this time due to worsening  renal function with metabolic acidosis and acute metabolic encephalopathy requiring aggressive hydration, may need hemodialysis if fails to improve with hydration*  Status is: Inpatient  Remains inpatient appropriate because: Disposition above  Dispo: The patient is from: Home              Anticipated d/c is to: Home              Anticipated d/c date is: > 3 days              Patient currently is not medically stable to d/c. Barriers: Not Clinically Stable-    With History of - Reviewed by me  Past Medical History:  Diagnosis Date   Arthritis    lower back   Chronic back pain    Disc disease   Chronic kidney disease    Coronary atherosclerosis of native coronary artery    Coronary calcifications by chest CT, Myoview demonstrating inferolateral scar   Deafness in right ear    Diabetes mellitus    Diabetic retinopathy (HCC)    moderate nonproliferative with macular edema in right eye   Diastolic dysfunction 03/8501   Grade 1.   Essential hypertension, benign    Heart murmur    in  past   HOH (hard of hearing)    Hyperkalemia    Kidney stones    Mixed hyperlipidemia    NAFLD (nonalcoholic fatty liver disease)    Pancreatic insufficiency    Diagnosed at [facility]   Prostate cancer [facility]) 2011   Shortness of breath dyspnea    occasional - liver pressing on right lung, decreased capacity   Subdural hematoma (HCC)    Type 2 diabetes mellitus ([place])    Wears hearing aid    "crossover" form right to left      Past Surgical History:  Procedure Laterality Date   APPENDECTOMY     Bilateral hip replacement     CATARACT EXTRACTION W/PHACO Left 05/15/2015   Procedure: CATARACT EXTRACTION PHACO AND INTRAOCULAR LENS PLACEMENT ([place]);  Surgeon: Ronnell Freshwater, MD;  Location: [place];  Service: Ophthalmology;  Laterality: Left;  DIABETIC - insulin pump and oral meds   CATARACT EXTRACTION W/PHACO Right 08/28/2015   Procedure: CATARACT EXTRACTION PHACO AND INTRAOCULAR  LENS PLACEMENT ([facility]);  Surgeon: Ronnell Freshwater, MD;  Location: [place];  Service: Ophthalmology;  Laterality: Right;  DIABETIC PER PT AND CINDY PLEASE KEEP ARRIVAL TIME AFTER 8AM    CHOLECYSTECTOMY     HERNIA REPAIR     INSERTION OF DIALYSIS CATHETER Right 01/22/2021   Procedure: INSERTION OF DIALYSIS CATHETER;  Surgeon: Virl Cagey, MD;  Location: [facility];  Service: General;  Laterality: Right;   PROSTATECTOMY  2011      Chief Complaint  Patient presents with   Weakness    Tired for a couple of months      HPI:    [name]  is a 80 y.o. male  with a history of type 2 diabetes on insulin, hypertension, CKD 3B with recent AKI requiring hemodialysis from 12/29/2020 through [place] part of July 8938,  nonalcoholic fatty liver disease, CAD, grade 1 diastolic dysfunction and hypertension presenting with worsening renal function and confusional episodes -Patient was seen by PCP on 04/12/2021 with mental status concerns, were done at the lab revealed worsening renal function -PCP advised ED eval --Creatinine is up to 5.25 from 3.16 on 04/06/2021, BUN is up to 112 from 52 on 03/26/2021 - WBC 13.1 -Hemoglobin is 8.8 which is close to baseline -Renal ultrasound suggest possible UTI but no hydronephrosis -Chest x-ray without acute finding -UA is not suggestive of UTI -Lactic acid is not elevated  Review of systems:    In addition to the HPI above,   A full Review of  Systems was done, all other systems reviewed are negative except as noted above in HPI , .    Social History:  Reviewed by me    Social History   Tobacco Use   Smoking status: Never   Smokeless tobacco: Never  Substance Use Topics   Alcohol use: No       Family History :  Reviewed by me    Family History  Problem Relation Age of Onset   Diabetes type II Mother    Heart attack Father       Home Medications:   Prior to Admission medications   Medication Sig Start Date End  Date Taking? Authorizing Provider  aspirin EC 81 MG tablet Take 81 mg by mouth at bedtime.    Yes [provider]  diltiazem (CARDIZEM CD) 240 MG 24 hr capsule Take 1 capsule (240 mg total) by mouth daily. 03/20/21  Yes [name], MD  furosemide (  LASIX) 40 MG tablet Take 40 mg by mouth daily. 03/22/21  Yes [provider]  insulin aspart (NOVOLOG) 100 UNIT/ML injection Inject 8 Units into the skin 3 (three) times daily with meals. Patient taking differently: Inject 10 Units into the skin in the morning. 02/06/21  Yes Shah, Pratik D, DO  insulin glargine (LANTUS) 100 UNIT/ML injection Inject 0.12 mLs (12 Units total) into the skin at bedtime. 03/20/21  Yes [name], MD  metoprolol tartrate (LOPRESSOR) 50 MG tablet Take 1 tablet (50 mg total) by mouth 2 (two) times daily. 03/20/21  Yes [name], MD  Multiple Vitamin (MULTIVITAMIN WITH MINERALS) TABS Take 1 tablet by mouth daily.   Yes [provider]  pravastatin (PRAVACHOL) 10 MG tablet TAKE 1 TABLET(10 MG) BY MOUTH DAILY WITH BREAKFAST Patient taking differently: Take 10 mg by mouth daily. 03/20/21  Yes [name], MD  sodium bicarbonate 650 MG tablet Take 650 mg by mouth daily.   Yes [provider]  ZENPEP 10000-32000 units CPEP TAKE 1 CAPSULE BY MOUTH THREE TIMES DAILY BEFORE MEALS Patient taking differently: Take 1 capsule by mouth with breakfast, with lunch, and with evening meal. 05/10/20  Yes [name], MD  calcitRIOL (ROCALTROL) 0.25 MCG capsule Take 1 capsule (0.25 mcg total) by mouth daily. Patient not taking: No sig reported 03/20/21   [name], MD  calcium acetate (PHOSLO) 667 MG capsule Take 2 capsules (1,334 mg total) by mouth 3 (three) times daily with meals. Patient not taking: No sig reported 03/20/21   [name], MD  cephALEXin (KEFLEX) 500 MG capsule Take 1 capsule (500 mg total) by mouth 3 (three) times daily. 04/12/21   [name], MD      Allergies:     Allergies  Allergen Reactions   Enalapril Maleate Cough     Physical Exam:   Vitals  Blood pressure 105/62, pulse (!) 50, temperature 97.9 F (36.6 C), temperature source Oral, resp. rate (!) 23, height 5' 8"  (1.727 m), weight 80.7 kg, SpO2 97 %.  Physical Examination: General appearance - alert, chronically ill l appearing, and in no distress Mental status -intermittently confused and disoriented but has moments of clarity and coherence Eyes - sclera anicteric Neck - supple, no JVD elevation , Chest - clear  to auscultation bilaterally, symmetrical air movement,  Heart - S1 and S2 normal, regular  Abdomen - soft, nontender, nondistended, no masses or organomegaly Neurological - screening mental status exam normal, neck supple without rigidity, cranial nerves II through XII intact, DTR's normal and symmetric Extremities - no pedal edema noted, intact peripheral pulses  Skin - warm, dry     Data Review:    CBC Recent Labs  Lab 04/12/21 1350 04/12/21 1640 04/13/21 1230  WBC  --  11.5* 13.1*  HGB 8.9* 9.0* 8.8*  HCT  --  29.2* 28.5*  PLT  --  231 247  MCV  --  97.7 101.1*  MCH  --  30.1 31.2  MCHC  --  30.8* 30.9  RDW  --  14.7 15.9*  LYMPHSABS  --  1,392 1.3  MONOABS  --   --  1.0  EOSABS  --  115 0.1  BASOSABS  --  35 0.1   ------------------------------------------------------------------------------------------------------------------  Chemistries  Recent Labs  Lab 04/12/21 1640 04/13/21 1230  NA 140 139  K 4.3 4.1  CL 116* 117*  CO2 <10* 8*  GLUCOSE 274* 160*  BUN 96* 112*  CREATININE 5.00* 5.25*  CALCIUM 7.1* 7.0*  AST 13 14*  ALT 16 19  ALKPHOS  --  134*  BILITOT 0.3 0.2*   ------------------------------------------------------------------------------------------------------------------ estimated creatinine clearance is 10.9 mL/min (A) (by C-G formula based on SCr of 5.25 mg/dL  (H)). ------------------------------------------------------------------------------------------------------------------ No results for input(s): TSH, T4TOTAL, T3FREE, THYROIDAB in the last 72 hours.  Invalid input(s): FREET3   Coagulation profile Recent Labs  Lab 04/13/21 1230  INR 1.5*   ------------------------------------------------------------------------------------------------------------------- No results for input(s): DDIMER in the last 72 hours. -------------------------------------------------------------------------------------------------------------------  Cardiac Enzymes No results for input(s): CKMB, TROPONINI, MYOGLOBIN in the last 168 hours.  Invalid input(s): CK ------------------------------------------------------------------------------------------------------------------    Component Value Date/Time   BNP 700.0 (H) 04/13/2021 1230   BNP 202 (H) 04/01/2019 1223     ---------------------------------------------------------------------------------------------------------------  Urinalysis    Component Value Date/Time   COLORURINE YELLOW 04/13/2021 1501   APPEARANCEUR HAZY (A) 04/13/2021 1501   LABSPEC >1.030 (H) 04/13/2021 1501   PHURINE 5.0 04/13/2021 1501   GLUCOSEU NEGATIVE 04/13/2021 1501   HGBUR TRACE (A) 04/13/2021 1501   BILIRUBINUR NEGATIVE 04/13/2021 1501   KETONESUR NEGATIVE 04/13/2021 1501   PROTEINUR 30 (A) 04/13/2021 1501   UROBILINOGEN 0.2 03/20/2013 2016   NITRITE NEGATIVE 04/13/2021 1501   LEUKOCYTESUR NEGATIVE 04/13/2021 1501    ----------------------------------------------------------------------------------------------------------------   Imaging Results:    US RENAL  Result Date: 04/13/2021 CLINICAL DATA:  Acute renal injury.  Hypertension. EXAM: RENAL / URINARY TRACT ULTRASOUND COMPLETE COMPARISON:  Ultrasound renal 12/24/2020. CT abdomen and pelvis 03/20/2013. FINDINGS: Right Kidney: Renal measurements: 12.2 x 7.0 x 7.1  cm = volume: 316 mL. There is no hydronephrosis. Echogenicity is within normal limits. No focal lesion identified. Left Kidney: Renal measurements: 12.9 x 6.2 x 7.0 cm = volume: 292 mL. There is no hydronephrosis. Echogenicity is within normal limits. No focal lesion identified. Bladder: The bilateral ureteral jets are not seen. Bladder wall appears diffusely thickened. There is some layering echogenic material in the dependent portion of the bladder. Other: None. IMPRESSION: 1. No hydronephrosis. 2. Diffuse bladder wall thickening. Correlate for cystitis. There is layering echogenic material in the bladder which may indicate infection or hemorrhage. Electronically Signed   By: Ronney Asters M.D.   On: 04/13/2021 15:42   DG Chest Port 1 View  Result Date: 04/13/2021 CLINICAL DATA:  Weakness. EXAM: PORTABLE CHEST 1 VIEW COMPARISON:  02/02/2021 FINDINGS: Right jugular dialysis catheter has been removed in the interim. Again noted is elevation of the right hemidiaphragm. Both lungs are clear. Heart and mediastinum are stable. Atherosclerotic calcifications at the aortic arch. Trachea is midline. Surgical clips in the right upper abdomen. IMPRESSION: 1. No acute abnormality. 2. Chronic elevation of the right hemidiaphragm. Electronically Signed   By: Markus Daft M.D.   On: 04/13/2021 13:24    Radiological Exams on Admission: US RENAL  Result Date: 04/13/2021 CLINICAL DATA:  Acute renal injury.  Hypertension. EXAM: RENAL / URINARY TRACT ULTRASOUND COMPLETE COMPARISON:  Ultrasound renal 12/24/2020. CT abdomen and pelvis 03/20/2013. FINDINGS: Right Kidney: Renal measurements: 12.2 x 7.0 x 7.1 cm = volume: 316 mL. There is no hydronephrosis. Echogenicity is within normal limits. No focal lesion identified. Left Kidney: Renal measurements: 12.9 x 6.2 x 7.0 cm = volume: 292 mL. There is no hydronephrosis. Echogenicity is within normal limits. No focal lesion identified. Bladder: The bilateral ureteral jets are not seen.  Bladder wall appears diffusely thickened. There is some layering echogenic material in the dependent portion of the bladder. Other:  None. IMPRESSION: 1. No hydronephrosis. 2. Diffuse bladder wall thickening. Correlate for cystitis. There is layering echogenic material in the bladder which may indicate infection or hemorrhage. Electronically Signed   By: Ronney Asters M.D.   On: 04/13/2021 15:42   DG Chest Port 1 View  Result Date: 04/13/2021 CLINICAL DATA:  Weakness. EXAM: PORTABLE CHEST 1 VIEW COMPARISON:  02/02/2021 FINDINGS: Right jugular dialysis catheter has been removed in the interim. Again noted is elevation of the right hemidiaphragm. Both lungs are clear. Heart and mediastinum are stable. Atherosclerotic calcifications at the aortic arch. Trachea is midline. Surgical clips in the right upper abdomen. IMPRESSION: 1. No acute abnormality. 2. Chronic elevation of the right hemidiaphragm. Electronically Signed   By: Markus Daft M.D.   On: 04/13/2021 13:24    DVT Prophylaxis -SCD   AM Labs Ordered, also please review Full Orders  Family Communication: Admission, patients condition and plan of care including tests being ordered have been discussed with the patient  who indicate understanding and agree with the plan   Code Status - Full Code  Likely DC to  home   Condition   stable  Roxan Hockey M.D on 04/13/2021 at 7:49 PM Go to [email or web address] -  for contact info  [facility] - Office  [phone]

## 2021-04-14 LAB — BASIC METABOLIC PANEL
Anion gap: 17 — ABNORMAL HIGH (ref 5–15)
BUN: 112 mg/dL — ABNORMAL HIGH (ref 8–23)
CO2: 8 mmol/L — ABNORMAL LOW (ref 22–32)
Calcium: 7.1 mg/dL — ABNORMAL LOW (ref 8.9–10.3)
Chloride: 119 mmol/L — ABNORMAL HIGH (ref 98–111)
Creatinine, Ser: 5.3 mg/dL — ABNORMAL HIGH (ref 0.61–1.24)
GFR, Estimated: 10 mL/min — ABNORMAL LOW (ref >60)
Glucose, Bld: 47 mg/dL — ABNORMAL LOW (ref 70–99)
Potassium: 4.1 mmol/L (ref 3.5–5.1)
Sodium: 144 mmol/L (ref 135–145)

## 2021-04-14 LAB — RENAL FUNCTION PANEL
Albumin: 2.4 g/dL — ABNORMAL LOW (ref 3.5–5.0)
Anion gap: 17 — ABNORMAL HIGH (ref 5–15)
BUN: 102 mg/dL — ABNORMAL HIGH (ref 8–23)
CO2: 7 mmol/L — ABNORMAL LOW (ref 22–32)
Calcium: 7 mg/dL — ABNORMAL LOW (ref 8.9–10.3)
Chloride: 118 mmol/L — ABNORMAL HIGH (ref 98–111)
Creatinine, Ser: 5.2 mg/dL — ABNORMAL HIGH (ref 0.61–1.24)
GFR, Estimated: 11 mL/min — ABNORMAL LOW (ref >60)
Glucose, Bld: 47 mg/dL — ABNORMAL LOW (ref 70–99)
Phosphorus: 11.9 mg/dL — ABNORMAL HIGH (ref 2.5–4.6)
Potassium: 4 mmol/L (ref 3.5–5.1)
Sodium: 142 mmol/L (ref 135–145)

## 2021-04-14 LAB — GLUCOSE, CAPILLARY
Glucose-Capillary: 120 mg/dL — ABNORMAL HIGH (ref 70–99)
Glucose-Capillary: 42 mg/dL — CL (ref 70–99)
Glucose-Capillary: 90 mg/dL (ref 70–99)
Glucose-Capillary: 142 mg/dL — ABNORMAL HIGH (ref 70–99)
Glucose-Capillary: 94 mg/dL (ref 70–99)

## 2021-04-14 LAB — CBC
HCT: 26.9 % — ABNORMAL LOW (ref 39.0–52.0)
Hemoglobin: 8 g/dL — ABNORMAL LOW (ref 13.0–17.0)
MCH: 30.3 pg (ref 26.0–34.0)
MCHC: 29.7 g/dL — ABNORMAL LOW (ref 30.0–36.0)
MCV: 101.9 fL — ABNORMAL HIGH (ref 80.0–100.0)
Platelets: 212 10*3/uL (ref 150–400)
RBC: 2.64 MIL/uL — ABNORMAL LOW (ref 4.22–5.81)
RDW: 15.9 % — ABNORMAL HIGH (ref 11.5–15.5)
WBC: 15.7 10*3/uL — ABNORMAL HIGH (ref 4.0–10.5)
nRBC: 0.1 % (ref 0.0–0.2)

## 2021-04-14 MED ORDER — DEXTROSE 50 % IV SOLN
INTRAVENOUS | Status: AC
  Administered 2021-04-14: 50 mL via INTRAVENOUS
  Filled 2021-04-14: qty 50

## 2021-04-14 MED ORDER — ALTEPLASE 2 MG IJ SOLR: 2.0000 mg | Freq: Once | INTRAMUSCULAR | Status: DC | PRN

## 2021-04-14 MED ORDER — HEPARIN SODIUM (PORCINE) 1000 UNIT/ML DIALYSIS
1000.0000 [IU] | INTRAMUSCULAR | Status: DC | PRN
  Administered 2021-04-14: 2600 [IU] via INTRAVENOUS_CENTRAL

## 2021-04-14 MED ORDER — SODIUM BICARBONATE 8.4 % IV SOLN
INTRAVENOUS | Status: AC
  Filled 2021-04-14: qty 100

## 2021-04-14 MED ORDER — CHLORHEXIDINE GLUCONATE CLOTH 2 % EX PADS
6.0000 | MEDICATED_PAD | Freq: Every day | CUTANEOUS | Status: DC
  Administered 2021-04-15: 6 via TOPICAL

## 2021-04-14 MED ORDER — SODIUM CHLORIDE 0.9 % IV SOLN: 100.0000 mL | INTRAVENOUS | Status: DC | PRN

## 2021-04-14 MED ORDER — DEXTROSE 50 % IV SOLN: 50.0000 mL | Freq: Once | INTRAVENOUS | Status: AC

## 2021-04-14 NOTE — Consult Note (Addendum)
[facility] Nephrology Consultation Note  Requesting MD: Dr Denton Brick, [place] Reason for consult: worsening renal failure, confusion, acidosis  HPI:  Mark WIGGER Sr. is a 80 y.o. male with history of hypertension, nonalcoholic fatty liver, CAD, diastolic CHF, COVID infection with prolonged hospitalization requiring dialysis for AKI, off of HD since 02/2021, presented from inpatient rehab for generalized weakness, confusion, nausea and functioning poorly, seen as a consultation for the evaluation and management of worsening renal failure.  The patient is well-known to me from prior admissions. Previously, he was receiving dialysis at [facility] for AKI.  During hospitalization in 02/2021 he was noticed to have renal recovery therefore he was taken off of dialysis.  He was followed by Dr. Royce Macadamia as outpatient and was managed medically.  He has chronic issue with metabolic acidosis.  Recently he was staying at a rehab where he was noticed to have declined clinically and physically.  He was not acting like himself and concerned about worsening uremia.  In the ER, he was initially hypotensive, in room air.  The labs showed creatinine level 5.2, BUN 112, serum CO2 8, potassium 4.1, hemoglobin 8.18.  The chest x-ray without pulmonary edema or congestion. Currently, he is alert awake and oriented to his name only.  He looks confused.  His wife at bedside. Given worsening acidosis and concern of uremia, we decided to start dialysis.  General surgeon Dr. Arnoldo Morale is placing temporary HD catheter.  Creat  Date/Time Value Ref Range Status  04/12/2021 04:40 PM 5.00 (H) 0.70 - 1.22 mg/dL Final  04/06/2021 03:16 PM 3.16 (H) 0.70 - 1.22 mg/dL Final  03/26/2021 11:54 AM 2.81 (H) 0.70 - 1.28 mg/dL Final  03/19/2021 12:50 PM 3.11 (H) 0.70 - 1.28 mg/dL Final  12/30/2019 11:43 AM 1.77 (H) 0.70 - 1.18 mg/dL Final   Creatinine, Ser  Date/Time Value Ref Range Status  04/14/2021 06:34 AM 5.20 (H)  0.61 - 1.24 mg/dL Final  04/14/2021 06:34 AM 5.30 (H) 0.61 - 1.24 mg/dL Final  04/13/2021 12:30 PM 5.25 (H) 0.61 - 1.24 mg/dL Final  02/06/2021 06:49 AM 3.03 (H) 0.61 - 1.24 mg/dL Final  02/05/2021 06:15 AM 3.10 (H) 0.61 - 1.24 mg/dL Final  02/05/2021 06:15 AM 2.96 (H) 0.61 - 1.24 mg/dL Final  02/04/2021 04:33 AM 2.85 (H) 0.61 - 1.24 mg/dL Final  02/03/2021 05:13 AM 2.65 (H) 0.61 - 1.24 mg/dL Final    PMHx:   Past Medical History:  Diagnosis Date   Arthritis    lower back   Chronic back pain    Disc disease   Chronic kidney disease    Coronary atherosclerosis of native coronary artery    Coronary calcifications by chest CT, Myoview demonstrating inferolateral scar   Deafness in right ear    Diabetes mellitus    Diabetic retinopathy (HCC)    moderate nonproliferative with macular edema in right eye   Diastolic dysfunction 01/4402   Grade 1.   Essential hypertension, benign    Heart murmur    in past   HOH (hard of hearing)    Hyperkalemia    Kidney stones    Mixed hyperlipidemia    NAFLD (nonalcoholic fatty liver disease)    Pancreatic insufficiency    Diagnosed at [facility]   Prostate cancer [facility]) 2011   Shortness of breath dyspnea    occasional - liver pressing on right lung, decreased capacity   Subdural hematoma (HCC)    Type 2 diabetes mellitus ([place])    Wears hearing  aid    "crossover" form right to left    Past Surgical History:  Procedure Laterality Date   APPENDECTOMY     Bilateral hip replacement     CATARACT EXTRACTION W/PHACO Left 05/15/2015   Procedure: CATARACT EXTRACTION PHACO AND INTRAOCULAR LENS PLACEMENT ([place]);  Surgeon: Ronnell Freshwater, MD;  Location: [place];  Service: Ophthalmology;  Laterality: Left;  DIABETIC - insulin pump and oral meds   CATARACT EXTRACTION W/PHACO Right 08/28/2015   Procedure: CATARACT EXTRACTION PHACO AND INTRAOCULAR LENS PLACEMENT ([facility]);  Surgeon: Ronnell Freshwater, MD;  Location: [place];  Service: Ophthalmology;  Laterality: Right;  DIABETIC PER PT AND CINDY PLEASE KEEP ARRIVAL TIME AFTER 8AM    CHOLECYSTECTOMY     HERNIA REPAIR     INSERTION OF DIALYSIS CATHETER Right 01/22/2021   Procedure: INSERTION OF DIALYSIS CATHETER;  Surgeon: Virl Cagey, MD;  Location: [facility];  Service: General;  Laterality: Right;   PROSTATECTOMY  2011    Family Hx:  Family History  Problem Relation Age of Onset   Diabetes type II Mother    Heart attack Father     Social History:  reports that he has never smoked. He has never used smokeless tobacco. He reports that he does not drink alcohol and does not use drugs.  Allergies:  Allergies  Allergen Reactions   Enalapril Maleate Cough    Medications: Prior to Admission medications   Medication Sig Start Date End Date Taking? Authorizing Provider  aspirin EC 81 MG tablet Take 81 mg by mouth at bedtime.    Yes [provider]  diltiazem (CARDIZEM CD) 240 MG 24 hr capsule Take 1 capsule (240 mg total) by mouth daily. 03/20/21  Yes [name], MD  furosemide (LASIX) 40 MG tablet Take 40 mg by mouth daily. 03/22/21  Yes [provider]  insulin aspart (NOVOLOG) 100 UNIT/ML injection Inject 8 Units into the skin 3 (three) times daily with meals. Patient taking differently: Inject 10 Units into the skin in the morning. 02/06/21  Yes Shah, Pratik D, DO  insulin glargine (LANTUS) 100 UNIT/ML injection Inject 0.12 mLs (12 Units total) into the skin at bedtime. 03/20/21  Yes [name], MD  metoprolol tartrate (LOPRESSOR) 50 MG tablet Take 1 tablet (50 mg total) by mouth 2 (two) times daily. 03/20/21  Yes [name], MD  Multiple Vitamin (MULTIVITAMIN WITH MINERALS) TABS Take 1 tablet by mouth daily.   Yes [provider]  pravastatin (PRAVACHOL) 10 MG tablet TAKE 1 TABLET(10 MG) BY MOUTH DAILY WITH BREAKFAST Patient taking differently: Take 10 mg by mouth daily. 03/20/21  Yes [name], MD   sodium bicarbonate 650 MG tablet Take 650 mg by mouth daily.   Yes [provider]  ZENPEP 10000-32000 units CPEP TAKE 1 CAPSULE BY MOUTH THREE TIMES DAILY BEFORE MEALS Patient taking differently: Take 1 capsule by mouth with breakfast, with lunch, and with evening meal. 05/10/20  Yes [name], MD  calcitRIOL (ROCALTROL) 0.25 MCG capsule Take 1 capsule (0.25 mcg total) by mouth daily. Patient not taking: No sig reported 03/20/21   [name], MD  calcium acetate (PHOSLO) 667 MG capsule Take 2 capsules (1,334 mg total) by mouth 3 (three) times daily with meals. Patient not taking: No sig reported 03/20/21   [name], MD  cephALEXin (KEFLEX) 500 MG capsule Take 1 capsule (500 mg total) by mouth 3 (three) times daily. 04/12/21  [name], MD    I have reviewed the patient's current medications.  Labs:  Results for orders placed or performed during the hospital encounter of 04/13/21 (from the past 48 hour(s))  CBC with Differential/Platelet     Status: Abnormal   Collection Time: 04/13/21 12:30 PM  Result Value Ref Range   WBC 13.1 (H) 4.0 - 10.5 K/uL   RBC 2.82 (L) 4.22 - 5.81 MIL/uL   Hemoglobin 8.8 (L) 13.0 - 17.0 g/dL   HCT 28.5 (L) 39.0 - 52.0 %   MCV 101.1 (H) 80.0 - 100.0 fL   MCH 31.2 26.0 - 34.0 pg   MCHC 30.9 30.0 - 36.0 g/dL   RDW 15.9 (H) 11.5 - 15.5 %   Platelets 247 150 - 400 K/uL   nRBC 0.2 0.0 - 0.2 %   Neutrophils Relative % 80 %   Neutro Abs 10.5 (H) 1.7 - 7.7 K/uL   Lymphocytes Relative 10 %   Lymphs Abs 1.3 0.7 - 4.0 K/uL   Monocytes Relative 8 %   Monocytes Absolute 1.0 0.1 - 1.0 K/uL   Eosinophils Relative 1 %   Eosinophils Absolute 0.1 0.0 - 0.5 K/uL   Basophils Relative 0 %   Basophils Absolute 0.1 0.0 - 0.1 K/uL   Immature Granulocytes 1 %   Abs Immature Granulocytes 0.14 (H) 0.00 - 0.07 K/uL    Comment: Performed at [facility], [address], [place], [place] [address]  Comprehensive metabolic panel     Status:  Abnormal   Collection Time: 04/13/21 12:30 PM  Result Value Ref Range   Sodium 139 135 - 145 mmol/L   Potassium 4.1 3.5 - 5.1 mmol/L   Chloride 117 (H) 98 - 111 mmol/L   CO2 8 (L) 22 - 32 mmol/L   Glucose, Bld 160 (H) 70 - 99 mg/dL    Comment: Glucose reference range applies only to samples taken after fasting for at least 8 hours.   BUN 112 (H) 8 - 23 mg/dL    Comment: RESULTS CONFIRMED BY MANUAL DILUTION   Creatinine, Ser 5.25 (H) 0.61 - 1.24 mg/dL   Calcium 7.0 (L) 8.9 - 10.3 mg/dL   Total Protein 6.7 6.5 - 8.1 g/dL   Albumin 2.6 (L) 3.5 - 5.0 g/dL   AST 14 (L) 15 - 41 U/L   ALT 19 0 - 44 U/L   Alkaline Phosphatase 134 (H) 38 - 126 U/L   Total Bilirubin 0.2 (L) 0.3 - 1.2 mg/dL   GFR, Estimated 10 (L) >60 mL/min    Comment: (NOTE) Calculated using the CKD-EPI Creatinine Equation (2021)    Anion gap 14 5 - 15    Comment: Performed at [facility], [address], [place], [place] [address]  Protime-INR     Status: Abnormal   Collection Time: 04/13/21 12:30 PM  Result Value Ref Range   Prothrombin Time 18.0 (H) 11.4 - 15.2 seconds   INR 1.5 (H) 0.8 - 1.2    Comment: (NOTE) INR goal varies based on device and disease states. Performed at [facility], [address] [place], [place] [address]   APTT     Status: Abnormal   Collection Time: 04/13/21 12:30 PM  Result Value Ref Range   aPTT 37 (H) 24 - 36 seconds    Comment:        IF BASELINE aPTT IS ELEVATED, SUGGEST PATIENT RISK ASSESSMENT BE USED TO DETERMINE APPROPRIATE ANTICOAGULANT THERAPY. Performed at [facility], [place]  [address] [place], [place] [address]   Brain natriuretic peptide     Status: Abnormal   Collection Time: 04/13/21 12:30 PM  Result Value Ref Range   B Natriuretic Peptide 700.0 (H) 0.0 - 100.0 pg/mL    Comment: Performed at [facility], [address], [place], [place] [address]  Lactic acid, plasma     Status: None   Collection Time: 04/13/21  1:01 PM  Result Value Ref Range   Lactic Acid,  Venous 0.7 0.5 - 1.9 mmol/L    Comment: Performed at [facility], [address], [place], [place] [address]  Blood culture (routine single)     Status: None (Preliminary result)   Collection Time: 04/13/21  1:01 PM   Specimen: BLOOD RIGHT ARM  Result Value Ref Range   Specimen Description BLOOD RIGHT ARM    Special Requests      Blood Culture adequate volume BOTTLES DRAWN AEROBIC AND ANAEROBIC   Culture      NO GROWTH < 24 HOURS Performed at [facility], [address], [place],  [address]    Report Status PENDING   Resp Panel by RT-PCR (Flu A&B, Covid) Nasopharyngeal Swab     Status: None   Collection Time: 04/13/21  1:15 PM   Specimen: Nasopharyngeal Swab; Nasopharyngeal(NP) swabs in vial transport medium  Result Value Ref Range   SARS Coronavirus 2 by RT PCR NEGATIVE NEGATIVE    Comment: (NOTE) SARS-CoV-2 target nucleic acids are NOT DETECTED.  The SARS-CoV-2 RNA is generally detectable in upper respiratory specimens during the acute phase of infection. The lowest concentration of SARS-CoV-2 viral copies this assay can detect is 138 copies/mL. A negative result does not preclude SARS-Cov-2 infection and should not be used as the sole basis for treatment or other patient management decisions. A negative result may occur with  improper specimen collection/handling, submission of specimen other than nasopharyngeal swab, presence of viral mutation(s) within the areas targeted by this assay, and inadequate number of viral copies(<138 copies/mL). A negative result must be combined with clinical observations, patient history, and epidemiological information. The expected result is Negative.  Fact Sheet for Patients:  EntrepreneurPulse.com.au  Fact Sheet for Healthcare Providers:  IncredibleEmployment.be  This test is no t yet approved or cleared by the [place] FDA and  has been authorized for detection and/or diagnosis of  SARS-CoV-2 by FDA under an Emergency Use Authorization (EUA). This EUA will remain  in effect (meaning this test can be used) for the duration of the COVID-19 declaration under Section 564(b)(1) of the Act, 21 U.S.C.section 360bbb-3(b)(1), unless the authorization is terminated  or revoked sooner.       Influenza A by PCR NEGATIVE NEGATIVE   Influenza B by PCR NEGATIVE NEGATIVE    Comment: (NOTE) The Xpert Xpress SARS-CoV-2/FLU/RSV plus assay is intended as an aid in the diagnosis of influenza from Nasopharyngeal swab specimens and should not be used as a sole basis for treatment. Nasal washings and aspirates are unacceptable for Xpert Xpress SARS-CoV-2/FLU/RSV testing.  Fact Sheet for Patients: EntrepreneurPulse.com.au  Fact Sheet for Healthcare Providers: IncredibleEmployment.be  This test is not yet approved or cleared by the [place] FDA and has been authorized for detection and/or diagnosis of SARS-CoV-2 by FDA under an Emergency Use Authorization (EUA). This EUA will remain in effect (meaning this test can be used) for the duration of the COVID-19 declaration under Section 564(b)(1) of the Act, 21 U.S.C. section 360bbb-3(b)(1), unless the authorization is terminated or revoked.  Performed  at [facility], [address] [place], [place] [address]   Urinalysis, Routine w reflex microscopic     Status: Abnormal   Collection Time: 04/13/21  3:01 PM  Result Value Ref Range   Color, Urine YELLOW YELLOW   APPearance HAZY (A) CLEAR   Specific Gravity, Urine >1.030 (H) 1.005 - 1.030   pH 5.0 5.0 - 8.0   Glucose, UA NEGATIVE NEGATIVE mg/dL   Hgb urine dipstick TRACE (A) NEGATIVE   Bilirubin Urine NEGATIVE NEGATIVE   Ketones, ur NEGATIVE NEGATIVE mg/dL   Protein, ur 30 (A) NEGATIVE mg/dL   Nitrite NEGATIVE NEGATIVE   Leukocytes,Ua NEGATIVE NEGATIVE    Comment: Performed at [facility], [address] [place], [place] [address]   Urinalysis, Microscopic (reflex)     Status: None   Collection Time: 04/13/21  3:01 PM  Result Value Ref Range   RBC / HPF 6-10 0 - 5 RBC/hpf   WBC, UA 0-5 0 - 5 WBC/hpf   Bacteria, UA NONE SEEN NONE SEEN   Squamous Epithelial / LPF 0-5 0 - 5    Comment: Performed at [facility], [address], [place], [place] [address]  Lactic acid, plasma     Status: None   Collection Time: 04/13/21  3:20 PM  Result Value Ref Range   Lactic Acid, Venous 1.2 0.5 - 1.9 mmol/L    Comment: Performed at [facility], [address], [place], [place] [address]  Glucose, capillary     Status: Abnormal   Collection Time: 04/13/21  9:24 PM  Result Value Ref Range   Glucose-Capillary 66 (L) 70 - 99 mg/dL    Comment: Glucose reference range applies only to samples taken after fasting for at least 8 hours.  Glucose, capillary     Status: None   Collection Time: 04/13/21 10:12 PM  Result Value Ref Range   Glucose-Capillary 87 70 - 99 mg/dL    Comment: Glucose reference range applies only to samples taken after fasting for at least 8 hours.  Glucose, capillary     Status: Abnormal   Collection Time: 04/14/21  6:30 AM  Result Value Ref Range   Glucose-Capillary 42 (LL) 70 - 99 mg/dL    Comment: Glucose reference range applies only to samples taken after fasting for at least 8 hours.   Comment 1 Notify RN   Renal function panel     Status: Abnormal   Collection Time: 04/14/21  6:34 AM  Result Value Ref Range   Sodium 142 135 - 145 mmol/L   Potassium 4.0 3.5 - 5.1 mmol/L   Chloride 118 (H) 98 - 111 mmol/L   CO2 7 (L) 22 - 32 mmol/L   Glucose, Bld 47 (L) 70 - 99 mg/dL    Comment: Glucose reference range applies only to samples taken after fasting for at least 8 hours.   BUN 102 (H) 8 - 23 mg/dL    Comment: RESULTS CONFIRMED BY MANUAL DILUTION   Creatinine, Ser 5.20 (H) 0.61 - 1.24 mg/dL   Calcium 7.0 (L) 8.9 - 10.3 mg/dL   Phosphorus 11.9 (H) 2.5 - 4.6 mg/dL   Albumin 2.4 (L) 3.5 - 5.0 g/dL   GFR,  Estimated 11 (L) >60 mL/min    Comment: (NOTE) Calculated using the CKD-EPI Creatinine Equation (2021)    Anion gap 17 (H) 5 - 15    Comment: Performed at [facility], [address] [place], [place] [address]  Basic metabolic panel  Status: Abnormal   Collection Time: 04/14/21  6:34 AM  Result Value Ref Range   Sodium 144 135 - 145 mmol/L   Potassium 4.1 3.5 - 5.1 mmol/L   Chloride 119 (H) 98 - 111 mmol/L   CO2 8 (L) 22 - 32 mmol/L   Glucose, Bld 47 (L) 70 - 99 mg/dL    Comment: Glucose reference range applies only to samples taken after fasting for at least 8 hours.   BUN 112 (H) 8 - 23 mg/dL    Comment: RESULTS CONFIRMED BY MANUAL DILUTION   Creatinine, Ser 5.30 (H) 0.61 - 1.24 mg/dL   Calcium 7.1 (L) 8.9 - 10.3 mg/dL   GFR, Estimated 10 (L) >60 mL/min    Comment: (NOTE) Calculated using the CKD-EPI Creatinine Equation (2021)    Anion gap 17 (H) 5 - 15    Comment: Performed at [facility], [address], [place], [place] [address]  CBC     Status: Abnormal   Collection Time: 04/14/21  6:34 AM  Result Value Ref Range   WBC 15.7 (H) 4.0 - 10.5 K/uL   RBC 2.64 (L) 4.22 - 5.81 MIL/uL   Hemoglobin 8.0 (L) 13.0 - 17.0 g/dL   HCT 26.9 (L) 39.0 - 52.0 %   MCV 101.9 (H) 80.0 - 100.0 fL   MCH 30.3 26.0 - 34.0 pg   MCHC 29.7 (L) 30.0 - 36.0 g/dL   RDW 15.9 (H) 11.5 - 15.5 %   Platelets 212 150 - 400 K/uL   nRBC 0.1 0.0 - 0.2 %    Comment: Performed at [facility], [address], [place], [place] [address]  Glucose, capillary     Status: Abnormal   Collection Time: 04/14/21  6:53 AM  Result Value Ref Range   Glucose-Capillary 120 (H) 70 - 99 mg/dL    Comment: Glucose reference range applies only to samples taken after fasting for at least 8 hours.  Glucose, capillary     Status: None   Collection Time: 04/14/21  8:00 AM  Result Value Ref Range   Glucose-Capillary 90 70 - 99 mg/dL    Comment: Glucose reference range applies only to samples taken after fasting for at least  8 hours.     ROS:  Pertinent items noted in HPI and remainder of comprehensive ROS otherwise negative.  Physical Exam: Vitals:   04/14/21 0501 04/14/21 0803  BP: 111/60 126/73  Pulse: 95 98  Resp: (!) 24 (!) 24  Temp: (!) 97.5 F (36.4 C) (!) 97.4 F (36.3 C)  SpO2: 99% 100%     General exam: Confused male, lying on bed, not in distress Respiratory system: Clear to auscultation. Respiratory effort normal. No wheezing or crackle Cardiovascular system: S1 & S2 heard, RRR.  No pedal edema. Gastrointestinal system: Abdomen is nondistended, soft and nontender. Normal bowel sounds heard. Central nervous system: Alert awake and oriented to himself only. Extremities: No LE edema.  No cyanosis or clubbing Skin: No rashes, lesions or ulcers Psychiatry: Alert awake, confused  Assessment/Plan:  #Progressive CKD5 associated with severe acidosis and uremic encephalopathy, now progressed to new ESRD.  Patient had required dialysis in the past when he had AKI on CKD due to [place] and then taken off of dialysis in 02/2021 when he has renal recovery.  It seems like he developed worsening uremic symptoms.  I have discussed with the patient's wife today and we plan to start dialysis during this hospitalization.  Dr. Arnoldo Morale is already following and  planning to place temporary HD catheter.  Discussed with HD nurse Levada Dy. The patient will need tunnel catheter by Dr. Constance Haw on Monday and case manager/social worker evaluation to arrange outpatient HD. Plan for second HD on 9/12. Discussed with primary team Dr.Emokpae and the surgeon Dr. Arnoldo Morale.  #Anion gap metabolic acidosis: Starting dialysis as above.  #Hypertension/volume: No edema and no fluid overload on exam.  We will not UF with HD.  Monitor BP.  #Anemia of CKD/chronic illness: Check IV iron.  Will benefit from ESA pending iron level.  #Secondary hyperparathyroidism: Continue calcitriol and phoslo.  Monitor calcium phosphorus level.  Thank  you for the consult.  We will follow with you.  [name] Tanna Furry 04/14/2021, 11:50 AM  [employer].

## 2021-04-14 NOTE — Procedures (Signed)
   HEMODIALYSIS TREATMENT NOTE:  First HD since 01/24/21.  Pre-HD pt was drowsy but responsive to voice and tactile stimuli.  125/57 p 90s NSR with frequent PACs.  Tachypneic with spO2 100% on RA.  Consent for dialysis was obtained from pt's wife.  Appreciate Dr. Arnoldo Morale placing right fem vas-cath earlier today.   No UF as per order.  Converted to rate-controlled Afib early into session (had Afib with RVR on HD with minimal UF during his last admission).  3 hour session completed.  Catheter tolerated prescribed flow.  All blood was returned.    Remains in Afib 85-105.  About 30-50cc of UOP in suction canister with noted hematuria.   Report given to Ayesha Mohair, RN.   Rockwell Alexandria, RN

## 2021-04-14 NOTE — Consult Note (Signed)
Patient:  FATE CASTER Sr.  DOB:  1941/04/20  MRN:  [id number]   Preop Diagnosis: Acute on chronic renal failure, need for dialysis  Postop Diagnosis: Same  Procedure: Dialysis catheter insertion  Surgeon: Aviva Signs, MD  Anes: Local  Indications: Patient is a 80 year old white male who presents with uremia.  He is about to undergo dialysis and needs dialysis catheter placement.  The risks and benefits of the procedure were explained to the patient's wife, gave informed consent set for the patient as the patient is not competent to sign.  Procedure note: The procedure was performed in the dialysis unit.  1% Xylocaine was used for local anesthesia.  Full sterile technique was used.  A timeout was performed.  I did use a Doppler to identify the right femoral artery.  Once this was identified, a needle was advanced into the right femoral vein.  A guidewire was then advanced.  An introducer was placed over the guidewire.  A triple-lumen dialysis cath was then inserted over the guidewire and the guidewire was removed.  Good backflow of venous blood was noted on aspiration of all 3 ports.  All 3 ports were flushed with heparin.  The catheter was secured in place using a 3-0 silk suture.  Dry sterile dressing was applied.  The patient tolerated the procedure well.  The catheter is available for dialysis.  Complications: None  EBL: Minimal  Specimen: None

## 2021-04-14 NOTE — Progress Notes (Signed)
Patient Demographics:    [name], is a 80 y.o. male, DOB - Oct 27, 1940, VAN:[id number]  Admit date - 04/13/2021   Admitting Physician Merrilyn Legler Denton Brick, MD  Outpatient Primary MD for the patient is Pickard, Cammie Mcgee, MD  LOS - 1   Chief Complaint  Patient presents with   Weakness    Tired for a couple of months        Subjective:    [name] today has no fevers, no emesis,  No chest pain,   -Wife at bedside, questions answered -Remains confused and disoriented  Assessment  & Plan :    Principal Problem:   Acute on chronic renal failure ([place]) Active Problems:   AKI (acute kidney injury) ([place])   ESRD (end stage renal disease) ([place])   Uncontrolled type 2 diabetes mellitus with hyperglycemia ([place])   Acute metabolic encephalopathy   HOH (hard of hearing)   1)CKD5-progressed with new ESRD -patient was treated with hemodialysis from 12/29/2020 through early part of July 2022 -He was taken off hemodialysis due to renal recovery -Patient had hemodialysis on 04/14/2021 due to uremia/encephalopathy -Renal function appears to be worsening again with worsening anion gap metabolic acidosis --Creatinine is up to 5.25 from 3.16 on 04/06/2021, BUN is up to 112 from 52 on 03/26/2021 --Renal ultrasound suggest possible cystitis, but no hydronephrosis-UA is not suggestive of UTI -Patient is voiding well Nephrology consult from nephrologist Dr. Lawson Radar appreciated -Patient Re-initiated on hemodialysis on 04/14/2021-anticipate lifelong hemodialysis -Continue bicarb replacements   2)Acute metabolic encephalopathy due to uremia-worse overall --confusion and disorientation from time to time most likely due to #1 above. -Anticipate improvement with HD sessions heme   Hypocalcemia- -Continue replacement  -Continue calcitriol and cholecalciferol.       Anemia in CKD-  -ESA/Epogen per nephrology  team -Follow hemoglobin trend and further transfuse as clinically indicated.    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 fib with RVR-hold Cardizem and  hold Lopressor due to soft BP   Coronary artery disease -no chest pain symptoms at this time.   -Continue aspirin,  -Hold metoprolol due to soft BP   Disposition/Need for in-Hospital Stay- patient unable to be discharged at this time due to worsening renal function with metabolic acidosis and acute metabolic encephalopathy requiring dialysis -Patient will need arrangement for outpatient hemodialysis and clipping process prior to discharge Status is: Inpatient   Remains inpatient appropriate because: Disposition above   Dispo: The patient is from: Home              Anticipated d/c is to: Home              Anticipated d/c date is: > 3 days              Patient currently is not medically stable to d/c. Barriers: Not Clinically Stable-    Family Communication:   Discussed with wife at bedside  Consults  :  Nephrology  DVT Prophylaxis  :   - SCDs  heparin injection 5,000 Units Start: 04/13/21 2200 SCDs Start: 04/13/21 1946 Place TED hose Start: 04/13/21 1946    Lab Results  Component  Value Date   PLT 212 04/14/2021    Inpatient Medications  Scheduled Meds:  aspirin EC  81 mg Oral Q breakfast   calcitRIOL  0.25 mcg Oral Daily   calcium acetate  1,334 mg Oral TID WC   [START ON 04/15/2021] Chlorhexidine Gluconate Cloth  6 each Topical Q0600   heparin  5,000 Units Subcutaneous Q8H   insulin glargine-yfgn  10 Units Subcutaneous Daily   lipase/protease/amylase  12,000 Units Oral TID with meals   multivitamin with minerals  1 tablet Oral Daily   sodium chloride flush  3 mL Intravenous Q12H   sodium chloride flush  3 mL Intravenous Q12H   Continuous Infusions:  sodium chloride      sodium chloride     sodium chloride     cefTRIAXone (ROCEPHIN)  IV 2 g (04/14/21 1800)   lactated ringers     PRN Meds:.sodium chloride, sodium chloride, sodium chloride, acetaminophen **OR** acetaminophen, alteplase, bisacodyl, heparin, ondansetron **OR** ondansetron (ZOFRAN) IV, polyethylene glycol, sodium chloride flush, traZODone    Anti-infectives (From admission, onward)    Start     Dose/Rate Route Frequency Ordered Stop   04/13/21 1300  cefTRIAXone (ROCEPHIN) 2 g in sodium chloride 0.9 % 100 mL IVPB        2 g 200 mL/hr over 30 Minutes Intravenous Every 24 hours 04/13/21 1257 04/20/21 1259         Objective:   Vitals:   04/14/21 1700 04/14/21 1730 04/14/21 1800 04/14/21 1830  BP: 133/76 (!) 149/75 (!) 142/77 137/90  Pulse: 98 96 90 70  Resp: 20 (!) 22 (!) 23 (!) 24  Temp:      TempSrc:      SpO2:      Weight:      Height:        Wt Readings from Last 3 Encounters:  04/14/21 86.3 kg  04/12/21 80.7 kg  04/06/21 80.7 kg     Intake/Output Summary (Last 24 hours) at 04/14/2021 1845 Last data filed at 04/14/2021 0830 Gross per 24 hour  Intake 2630.15 ml  Output 100 ml  Net 2530.15 ml   Physical Exam  Gen:-Sleepy, confused and disoriented HEENT:- [place].AT, No sclera icterus Neck-Supple Neck,No JVD,.  Lungs-no significant rales or wheezes at this time fair symmetrical air movement CV- S1, S2 normal, regular  Abd-  +ve B.Sounds, Abd Soft, No tenderness,    Extremity/Skin:- No  edema, pedal pulses present  Psych-confusion, disorientation and lethargy  neuro-generalized weakness, no new focal deficits, no tremors   Data Review:   Micro Results Recent Results (from the past 240 hour(s))  Urine Culture     Status: None   Collection Time: 04/10/21 11:38 AM   Specimen: Urine  Result Value Ref Range Status   MICRO NUMBER: [id number]  Final   SPECIMEN QUALITY: Adequate  Final   Sample Source NOT GIVEN  Final   STATUS: FINAL  Final   ISOLATE 1:   Final    Less  than 10,000 CFU/mL of single Gram positive organism isolated. No further testing will be performed. If clinically indicated, recollection using a method to minimize contamination, with prompt transfer to Urine Culture Transport Tube, is recommended.   COMMENT:   Final    Additional non-predominating organism(s) isolated. These organisms, commonly found on external and internal genitalia, are considered colonizers. No further testing performed.  MICROSCOPIC MESSAGE     Status: None   Collection Time: 04/10/21 11:38 AM  Result Value Ref Range Status  Note   Final    Comment: This urine was analyzed for the presence of WBC,  RBC, bacteria, casts, and other formed elements.  Only those elements seen were reported. . .   Blood culture (routine single)     Status: None (Preliminary result)   Collection Time: 04/13/21  1:01 PM   Specimen: BLOOD RIGHT ARM  Result Value Ref Range Status   Specimen Description BLOOD RIGHT ARM  Final   Special Requests   Final    Blood Culture adequate volume BOTTLES DRAWN AEROBIC AND ANAEROBIC   Culture   Final    NO GROWTH < 24 HOURS Performed at [facility], [address] [place], [place] [address]    Report Status PENDING  Incomplete  Resp Panel by RT-PCR (Flu A&B, Covid) Nasopharyngeal Swab     Status: None   Collection Time: 04/13/21  1:15 PM   Specimen: Nasopharyngeal Swab; Nasopharyngeal(NP) swabs in vial transport medium  Result Value Ref Range Status   SARS Coronavirus 2 by RT PCR NEGATIVE NEGATIVE Final    Comment: (NOTE) SARS-CoV-2 target nucleic acids are NOT DETECTED.  The SARS-CoV-2 RNA is generally detectable in upper respiratory specimens during the acute phase of infection. The lowest concentration of SARS-CoV-2 viral copies this assay can detect is 138 copies/mL. A negative result does not preclude SARS-Cov-2 infection and should not be used as the sole basis for treatment or other patient management decisions. A negative result may  occur with  improper specimen collection/handling, submission of specimen other than nasopharyngeal swab, presence of viral mutation(s) within the areas targeted by this assay, and inadequate number of viral copies(<138 copies/mL). A negative result must be combined with clinical observations, patient history, and epidemiological information. The expected result is Negative.  Fact Sheet for Patients:  EntrepreneurPulse.com.au  Fact Sheet for Healthcare Providers:  IncredibleEmployment.be  This test is no t yet approved or cleared by the [place] FDA and  has been authorized for detection and/or diagnosis of SARS-CoV-2 by FDA under an Emergency Use Authorization (EUA). This EUA will remain  in effect (meaning this test can be used) for the duration of the COVID-19 declaration under Section 564(b)(1) of the Act, 21 U.S.C.section 360bbb-3(b)(1), unless the authorization is terminated  or revoked sooner.       Influenza A by PCR NEGATIVE NEGATIVE Final   Influenza B by PCR NEGATIVE NEGATIVE Final    Comment: (NOTE) The Xpert Xpress SARS-CoV-2/FLU/RSV plus assay is intended as an aid in the diagnosis of influenza from Nasopharyngeal swab specimens and should not be used as a sole basis for treatment. Nasal washings and aspirates are unacceptable for Xpert Xpress SARS-CoV-2/FLU/RSV testing.  Fact Sheet for Patients: EntrepreneurPulse.com.au  Fact Sheet for Healthcare Providers: IncredibleEmployment.be  This test is not yet approved or cleared by the [place] FDA and has been authorized for detection and/or diagnosis of SARS-CoV-2 by FDA under an Emergency Use Authorization (EUA). This EUA will remain in effect (meaning this test can be used) for the duration of the COVID-19 declaration under Section 564(b)(1) of the Act, 21 U.S.C. section 360bbb-3(b)(1), unless the authorization is terminated  or revoked.  Performed at [facility], [address], [place], [place] [address]     Radiology Reports US RENAL  Result Date: 04/13/2021 CLINICAL DATA:  Acute renal injury.  Hypertension. EXAM: RENAL / URINARY TRACT ULTRASOUND COMPLETE COMPARISON:  Ultrasound renal 12/24/2020. CT abdomen and pelvis 03/20/2013. FINDINGS: Right Kidney: Renal measurements: 12.2 x 7.0 x 7.1  cm = volume: 316 mL. There is no hydronephrosis. Echogenicity is within normal limits. No focal lesion identified. Left Kidney: Renal measurements: 12.9 x 6.2 x 7.0 cm = volume: 292 mL. There is no hydronephrosis. Echogenicity is within normal limits. No focal lesion identified. Bladder: The bilateral ureteral jets are not seen. Bladder wall appears diffusely thickened. There is some layering echogenic material in the dependent portion of the bladder. Other: None. IMPRESSION: 1. No hydronephrosis. 2. Diffuse bladder wall thickening. Correlate for cystitis. There is layering echogenic material in the bladder which may indicate infection or hemorrhage. Electronically Signed   By: Ronney Asters M.D.   On: 04/13/2021 15:42   DG Chest Port 1 View  Result Date: 04/13/2021 CLINICAL DATA:  Weakness. EXAM: PORTABLE CHEST 1 VIEW COMPARISON:  02/02/2021 FINDINGS: Right jugular dialysis catheter has been removed in the interim. Again noted is elevation of the right hemidiaphragm. Both lungs are clear. Heart and mediastinum are stable. Atherosclerotic calcifications at the aortic arch. Trachea is midline. Surgical clips in the right upper abdomen. IMPRESSION: 1. No acute abnormality. 2. Chronic elevation of the right hemidiaphragm. Electronically Signed   By: Markus Daft M.D.   On: 04/13/2021 13:24     CBC Recent Labs  Lab 04/12/21 1350 04/12/21 1640 04/13/21 1230 04/14/21 0634  WBC  --  11.5* 13.1* 15.7*  HGB 8.9* 9.0* 8.8* 8.0*  HCT  --  29.2* 28.5* 26.9*  PLT  --  231 247 212  MCV  --  97.7 101.1* 101.9*  MCH  --  30.1 31.2 30.3   MCHC  --  30.8* 30.9 29.7*  RDW  --  14.7 15.9* 15.9*  LYMPHSABS  --  1,392 1.3  --   MONOABS  --   --  1.0  --   EOSABS  --  115 0.1  --   BASOSABS  --  35 0.1  --     Chemistries  Recent Labs  Lab 04/12/21 1640 04/13/21 1230 04/14/21 0634  NA 140 139 142  144  K 4.3 4.1 4.0  4.1  CL 116* 117* 118*  119*  CO2 <10* 8* 7*  8*  GLUCOSE 274* 160* 47*  47*  BUN 96* 112* 102*  112*  CREATININE 5.00* 5.25* 5.20*  5.30*  CALCIUM 7.1* 7.0* 7.0*  7.1*  AST 13 14*  --   ALT 16 19  --   ALKPHOS  --  134*  --   BILITOT 0.3 0.2*  --    ------------------------------------------------------------------------------------------------------------------ No results for input(s): CHOL, HDL, LDLCALC, TRIG, CHOLHDL, LDLDIRECT in the last 72 hours.  Lab Results  Component Value Date   HGBA1C 6.2 (H) 03/19/2021   ------------------------------------------------------------------------------------------------------------------ No results for input(s): TSH, T4TOTAL, T3FREE, THYROIDAB in the last 72 hours.  Invalid input(s): FREET3 ------------------------------------------------------------------------------------------------------------------ Recent Labs    04/12/21 1404  FERRITIN 127  TIBC 232*  IRON 23*    Coagulation profile Recent Labs  Lab 04/13/21 1230  INR 1.5*    No results for input(s): DDIMER in the last 72 hours.  Cardiac Enzymes No results for input(s): CKMB, TROPONINI, MYOGLOBIN in the last 168 hours.  Invalid input(s): CK ------------------------------------------------------------------------------------------------------------------    Component Value Date/Time   BNP 700.0 (H) 04/13/2021 1230   BNP 202 (H) 04/01/2019 1223     Roxan Hockey M.D on 04/14/2021 at 6:45 PM  Go to [email or web address] - for contact info  [facility] - Office  [phone]

## 2021-04-14 NOTE — Progress Notes (Signed)
Dr. Josephine Cables on unit to assess patient.  Discussed concerns regarding patient's respiratory rate, and crackles/wheezing.  No new orders received.  Also discussed concern regarding no urine output.  Of note, there was none documented while in the ED either.  Bladder scan done showing 212 at most.  Patient voided 100 mls around 0230. No new orders received.

## 2021-04-14 NOTE — Progress Notes (Signed)
Sacrum has a scabbed over area. Multiple area areas that appear to be healed pressure injuries.  Foam placed.  Glans of penis has multiple areas are yellow tissue.  Additionally, on the perineum there is an area that's open.  Scant amount of bloody drainage noted. I was unable to measure this wound as patient become dyspneic with laying flat.

## 2021-04-15 LAB — IRON AND TIBC
Iron: 11 ug/dL — ABNORMAL LOW (ref 45–182)
Saturation Ratios: 7 % — ABNORMAL LOW (ref 17.9–39.5)
TIBC: 148 ug/dL — ABNORMAL LOW (ref 250–450)
UIBC: 137 ug/dL

## 2021-04-15 LAB — GLUCOSE, CAPILLARY
Glucose-Capillary: 111 mg/dL — ABNORMAL HIGH (ref 70–99)
Glucose-Capillary: 126 mg/dL — ABNORMAL HIGH (ref 70–99)
Glucose-Capillary: 145 mg/dL — ABNORMAL HIGH (ref 70–99)
Glucose-Capillary: 165 mg/dL — ABNORMAL HIGH (ref 70–99)
Glucose-Capillary: 39 mg/dL — CL (ref 70–99)
Glucose-Capillary: 72 mg/dL (ref 70–99)
Glucose-Capillary: 72 mg/dL (ref 70–99)
Glucose-Capillary: 82 mg/dL (ref 70–99)
Glucose-Capillary: 82 mg/dL (ref 70–99)
Glucose-Capillary: 84 mg/dL (ref 70–99)

## 2021-04-15 LAB — RENAL FUNCTION PANEL
Albumin: 2 g/dL — ABNORMAL LOW (ref 3.5–5.0)
Anion gap: 10 (ref 5–15)
BUN: 62 mg/dL — ABNORMAL HIGH (ref 8–23)
CO2: 19 mmol/L — ABNORMAL LOW (ref 22–32)
Calcium: 6.2 mg/dL — CL (ref 8.9–10.3)
Chloride: 110 mmol/L (ref 98–111)
Creatinine, Ser: 3.46 mg/dL — ABNORMAL HIGH (ref 0.61–1.24)
GFR, Estimated: 17 mL/min — ABNORMAL LOW (ref >60)
Glucose, Bld: 88 mg/dL (ref 70–99)
Phosphorus: 6 mg/dL — ABNORMAL HIGH (ref 2.5–4.6)
Potassium: 2.9 mmol/L — ABNORMAL LOW (ref 3.5–5.1)
Sodium: 139 mmol/L (ref 135–145)

## 2021-04-15 LAB — HEPATITIS B CORE ANTIBODY, TOTAL
Hep B Core Total Ab: NONREACTIVE
Hep B Core Total Ab: NONREACTIVE

## 2021-04-15 LAB — FERRITIN: Ferritin: 117 ng/mL (ref 24–336)

## 2021-04-15 LAB — HEPATITIS B SURFACE ANTIGEN: Hepatitis B Surface Ag: NONREACTIVE

## 2021-04-15 MED ORDER — CHLORHEXIDINE GLUCONATE CLOTH 2 % EX PADS
6.0000 | MEDICATED_PAD | Freq: Every day | CUTANEOUS | Status: DC
  Administered 2021-04-15 – 2021-04-18 (×3): 6 via TOPICAL

## 2021-04-15 MED ORDER — CHLORHEXIDINE GLUCONATE CLOTH 2 % EX PADS
6.0000 | MEDICATED_PAD | Freq: Once | CUTANEOUS | Status: AC
  Administered 2021-04-15: 6 via TOPICAL

## 2021-04-15 MED ORDER — ALBUTEROL SULFATE HFA 108 (90 BASE) MCG/ACT IN AERS: 2.0000 | INHALATION_SPRAY | RESPIRATORY_TRACT | Status: DC | PRN

## 2021-04-15 MED ORDER — DEXTROSE 50 % IV SOLN
INTRAVENOUS | Status: AC
  Filled 2021-04-15: qty 50

## 2021-04-15 MED ORDER — DEXTROSE 50 % IV SOLN
25.0000 g | INTRAVENOUS | Status: AC
  Administered 2021-04-15: 25 g via INTRAVENOUS

## 2021-04-15 MED ORDER — CHLORHEXIDINE GLUCONATE CLOTH 2 % EX PADS
6.0000 | MEDICATED_PAD | Freq: Once | CUTANEOUS | Status: AC
  Administered 2021-04-16: 6 via TOPICAL

## 2021-04-15 MED ORDER — INSULIN GLARGINE-YFGN 100 UNIT/ML ~~LOC~~ SOLN
6.0000 [IU] | Freq: Every day | SUBCUTANEOUS | Status: DC
  Administered 2021-04-16 – 2021-04-18 (×3): 6 [IU] via SUBCUTANEOUS
  Filled 2021-04-15 (×4): qty 0.06

## 2021-04-15 MED ORDER — SODIUM CHLORIDE 0.9 % IV SOLN
125.0000 mg | INTRAVENOUS | Status: DC
  Administered 2021-04-16 – 2021-04-18 (×2): 125 mg via INTRAVENOUS
  Filled 2021-04-15 (×2): qty 10

## 2021-04-15 NOTE — Progress Notes (Signed)
Call made to Dr. Josephine Cables regarding pt status.  Yellow MEWS with resp ~26, glucose dropped to 36 and overall sleepiness.  He stated he would be up to assess patient sometime tonight.   Ayesha Mohair BSN RN CMSRN

## 2021-04-15 NOTE — Progress Notes (Signed)
   04/14/21 2351  Vitals  Temp 99.5 F (37.5 C)  Temp Source Oral  BP (!) 145/72  MAP (mmHg) 94  BP Location Right Arm  BP Method Automatic  Patient Position (if appropriate) Lying  Pulse Rate 100  Pulse Rate Source Monitor  Resp (!) 26  Level of Consciousness  Level of Consciousness Responds to Voice  MEWS COLOR  MEWS Score Color Yellow  Oxygen Therapy  SpO2 100 %  O2 Device Room Air  POSS Scale ([place] Opioid Sedation Scale)  POSS *See Group Information* 2-Acceptable,Slightly drowsy, easily aroused  MEWS Score  MEWS Temp 0  MEWS Systolic 0  MEWS Pulse 0  MEWS RR 2  MEWS LOC 1  MEWS Score 3  Provider Notification  Provider Name/Title Dr. Josephine Cables  Date Provider Notified 04/15/21  Time Provider Notified 0010  Notification Type Page  Notification Reason Critical result;Other (Comment) (Change in MEWS)  Test performed and critical result CBG 39, given amp of D50  Date Critical Result Received 04/15/21  Time Critical Result Received 0002  Provider response See new orders  Date of Provider Response 04/15/21  Time of Provider Response [phone]

## 2021-04-15 NOTE — Progress Notes (Signed)
Nephrology is requested a permacath placement for this patient.  Patient temporarily scheduled for permacath placement tomorrow.  The risks and benefits of the procedure were fully explained to the patient and the daughter who was present.  Orders have been placed.

## 2021-04-15 NOTE — Progress Notes (Signed)
Hypoglycemic Event  CBG: 39  Treatment: D50 50 mL (25 gm)  Symptoms: None  Follow-up CBG: Time: 0013 CBG Result: 165  Possible Reasons for Event: Unknown  Comments/MD notified: Dr. Josephine Cables notified    [name]

## 2021-04-15 NOTE — Progress Notes (Addendum)
Patient Demographics:    [name], is a 80 y.o. male, DOB - 25-Apr-1941, IPJ:[id number]  Admit date - 04/13/2021   Admitting Physician Mark Gullickson Denton Brick, MD  Outpatient Primary MD for the patient is Pickard, Cammie Mcgee, MD  LOS - 2   Chief Complaint  Patient presents with   Weakness    Tired for a couple of months        Subjective:    [name] today has no fevers, no emesis,  No chest pain,   -Wife at bedside, questions answered -More coherent, ate well for breakfast  Assessment  & Plan :    Principal Problem:   Acute on chronic renal failure ([place]) Active Problems:   AKI (acute kidney injury) ([place])   ESRD (end stage renal disease) ([place])   Uncontrolled type 2 diabetes mellitus with hyperglycemia ([place])   Acute metabolic encephalopathy   HOH (hard of hearing)   1)CKD5-progressed with new ESRD -patient was treated with hemodialysis from 12/29/2020 through early part of July 2022 -He was taken off hemodialysis due to renal recovery -Patient had hemodialysis on 04/14/2021 due to uremia/encephalopathy -Renal function appears to be worsening again with worsening anion gap metabolic acidosis --Renal ultrasound suggest possible cystitis, but no hydronephrosis-UA is not suggestive of UTI -Patient is voiding well Nephrology consult from nephrologist Dr. Lawson [name] appreciated -Patient Re-initiated on hemodialysis on 04/14/2021-anticipate lifelong hemodialysis -Continue bicarb replacements -Plans for PermCath placement on 04/16/2021   2)Acute metabolic encephalopathy due to uremia-worse overall --confusion and disorientation from time to time most likely due to #1 above. -Mental status improving with hemodialysis sessions   Hypocalcemia- -Continue replacement  -Continue calcitriol and cholecalciferol.       Anemia in CKD-  -ESA/Epogen per nephrology team -Follow hemoglobin trend and  further transfuse as clinically indicated.    Uncontrolled type 2 diabetes mellitus with renal complications B3A is 6.2 reflecting excellent Diabetic control PTA -Continue insulin regimen Use Novolog/Humalog Sliding scale insulin with Accu-Cheks/Fingersticks as ordered  -  SVT/A. fib with RVR-hold Cardizem and  hold Lopressor due to soft BP   Coronary artery disease -no chest pain symptoms at this time.   -Continue aspirin,  -Hold metoprolol due to soft BP   Disposition/Need for in-Hospital Stay- patient unable to be discharged at this time due to awaiting PermCath placement and clipping process for outpatient hemodialysis prior to discharge  status is: Inpatient   Remains inpatient appropriate because: Disposition above   Dispo: The patient is from: Home              Anticipated d/c is to: Home              Anticipated d/c date is: > 3 days              Patient currently is not medically stable to d/c. Barriers: Not Clinically Stable-    Family Communication:   Discussed with wife at bedside  Consults  :  Nephrology  DVT Prophylaxis  :   - SCDs  heparin injection 5,000 Units Start: 04/13/21 2200 SCDs Start: 04/13/21 1946 Place TED hose Start: 04/13/21 1946    Lab Results  Component Value Date   PLT 212 04/14/2021    Inpatient Medications  Scheduled  Meds:  aspirin EC  81 mg Oral Q breakfast   calcitRIOL  0.25 mcg Oral Daily   calcium acetate  1,334 mg Oral TID WC   Chlorhexidine Gluconate Cloth  6 each Topical Q0600   heparin  5,000 Units Subcutaneous Q8H   [START ON 04/16/2021] insulin glargine-yfgn  6 Units Subcutaneous Daily   lipase/protease/amylase  12,000 Units Oral TID with meals   multivitamin with minerals  1 tablet Oral Daily   sodium chloride flush  3 mL Intravenous Q12H   sodium chloride flush  3 mL Intravenous Q12H   Continuous Infusions:  sodium chloride     sodium chloride     sodium chloride     cefTRIAXone (ROCEPHIN)  IV 2 g (04/15/21 1316)    [START ON 04/16/2021] ferric gluconate (FERRLECIT) IVPB     lactated ringers     PRN Meds:.sodium chloride, sodium chloride, sodium chloride, acetaminophen **OR** acetaminophen, albuterol, alteplase, bisacodyl, heparin, ondansetron **OR** ondansetron (ZOFRAN) IV, polyethylene glycol, sodium chloride flush, traZODone    Anti-infectives (From admission, onward)    Start     Dose/Rate Route Frequency Ordered Stop   04/13/21 1300  cefTRIAXone (ROCEPHIN) 2 g in sodium chloride 0.9 % 100 mL IVPB        2 g 200 mL/hr over 30 Minutes Intravenous Every 24 hours 04/13/21 1257 04/20/21 1259         Objective:   Vitals:   04/15/21 0249 04/15/21 0346 04/15/21 0521 04/15/21 1440  BP: (!) 126/56 131/66 132/68 138/72  Pulse: 91 94 93 72  Resp: (!) 22 (!) 23 (!) 24 18  Temp: 99 F (37.2 C) 98.8 F (37.1 C) 99 F (37.2 C) 98.7 F (37.1 C)  TempSrc: Oral Oral  Oral  SpO2: 98% 100% 99% 98%  Weight:   84.4 kg   Height:        Wt Readings from Last 3 Encounters:  04/15/21 84.4 kg  04/12/21 80.7 kg  04/06/21 80.7 kg     Intake/Output Summary (Last 24 hours) at 04/15/2021 1731 Last data filed at 04/15/2021 1300 Gross per 24 hour  Intake 560 ml  Output 854 ml  Net -294 ml   Physical Exam  Gen:-More awake, more oriented  HEENT:- [place].AT, No sclera icterus Ears-- HOH Neck-Supple Neck,No JVD,.  Lungs-no significant rales or wheezes at this time fair symmetrical air movement CV- S1, S2 normal, regular  Abd-  +ve B.Sounds, Abd Soft, No tenderness,    Extremity/Skin:- No  edema, pedal pulses present  Psych-occasional episodes of confusion and disorientation but overall more light neuro-generalized weakness, no new focal deficits, no tremors MSK-- Rt Femoral HD catheter   Data Review:   Micro Results Recent Results (from the past 240 hour(s))  Urine Culture     Status: None   Collection Time: 04/10/21 11:38 AM   Specimen: Urine  Result Value Ref Range Status   MICRO NUMBER:  [id number]  Final   SPECIMEN QUALITY: Adequate  Final   Sample Source NOT GIVEN  Final   STATUS: FINAL  Final   ISOLATE 1:   Final    Less than 10,000 CFU/mL of single Gram positive organism isolated. No further testing will be performed. If clinically indicated, recollection using a method to minimize contamination, with prompt transfer to Urine Culture Transport Tube, is recommended.   COMMENT:   Final    Additional non-predominating organism(s) isolated. These organisms, commonly found on external and internal genitalia, are considered colonizers. No further testing  performed.  MICROSCOPIC MESSAGE     Status: None   Collection Time: 04/10/21 11:38 AM  Result Value Ref Range Status   Note   Final    Comment: This urine was analyzed for the presence of WBC,  RBC, bacteria, casts, and other formed elements.  Only those elements seen were reported. . .   Blood culture (routine single)     Status: None (Preliminary result)   Collection Time: 04/13/21  1:01 PM   Specimen: BLOOD RIGHT ARM  Result Value Ref Range Status   Specimen Description BLOOD RIGHT ARM  Final   Special Requests   Final    Blood Culture adequate volume BOTTLES DRAWN AEROBIC AND ANAEROBIC   Culture   Final    NO GROWTH 2 DAYS Performed at [facility], [address], [place], [place] [address]    Report Status PENDING  Incomplete  Resp Panel by RT-PCR (Flu A&B, Covid) Nasopharyngeal Swab     Status: None   Collection Time: 04/13/21  1:15 PM   Specimen: Nasopharyngeal Swab; Nasopharyngeal(NP) swabs in vial transport medium  Result Value Ref Range Status   SARS Coronavirus 2 by RT PCR NEGATIVE NEGATIVE Final    Comment: (NOTE) SARS-CoV-2 target nucleic acids are NOT DETECTED.  The SARS-CoV-2 RNA is generally detectable in upper respiratory specimens during the acute phase of infection. The lowest concentration of SARS-CoV-2 viral copies this assay can detect is 138 copies/mL. A negative result does not preclude  SARS-Cov-2 infection and should not be used as the sole basis for treatment or other patient management decisions. A negative result may occur with  improper specimen collection/handling, submission of specimen other than nasopharyngeal swab, presence of viral mutation(s) within the areas targeted by this assay, and inadequate number of viral copies(<138 copies/mL). A negative result must be combined with clinical observations, patient history, and epidemiological information. The expected result is Negative.  Fact Sheet for Patients:  EntrepreneurPulse.com.au  Fact Sheet for Healthcare Providers:  IncredibleEmployment.be  This test is no t yet approved or cleared by the [place] FDA and  has been authorized for detection and/or diagnosis of SARS-CoV-2 by FDA under an Emergency Use Authorization (EUA). This EUA will remain  in effect (meaning this test can be used) for the duration of the COVID-19 declaration under Section 564(b)(1) of the Act, 21 U.S.C.section 360bbb-3(b)(1), unless the authorization is terminated  or revoked sooner.       Influenza A by PCR NEGATIVE NEGATIVE Final   Influenza B by PCR NEGATIVE NEGATIVE Final    Comment: (NOTE) The Xpert Xpress SARS-CoV-2/FLU/RSV plus assay is intended as an aid in the diagnosis of influenza from Nasopharyngeal swab specimens and should not be used as a sole basis for treatment. Nasal washings and aspirates are unacceptable for Xpert Xpress SARS-CoV-2/FLU/RSV testing.  Fact Sheet for Patients: EntrepreneurPulse.com.au  Fact Sheet for Healthcare Providers: IncredibleEmployment.be  This test is not yet approved or cleared by the [place] FDA and has been authorized for detection and/or diagnosis of SARS-CoV-2 by FDA under an Emergency Use Authorization (EUA). This EUA will remain in effect (meaning this test can be used) for the duration of  the COVID-19 declaration under Section 564(b)(1) of the Act, 21 U.S.C. section 360bbb-3(b)(1), unless the authorization is terminated or revoked.  Performed at [facility], [address], [place], [place] [address]   Urine Culture     Status: Abnormal (Preliminary result)   Collection Time: 04/13/21  3:01 PM   Specimen:  In/Out Cath Urine  Result Value Ref Range Status   Specimen Description   Final    IN/OUT CATH URINE Performed at [facility], [address] [place], [place] [address]    Special Requests   Final    NONE Performed at [facility], [address] [place], [place] [address]    Culture (A)  Final    40,000 COLONIES/mL STAPHYLOCOCCUS AUREUS 10,000 COLONIES/mL ENTEROCOCCUS FAECALIS SUSCEPTIBILITIES TO FOLLOW Performed at [facility], [place] [address], [place], [place] [address]    Report Status PENDING  Incomplete    Radiology Reports US RENAL  Result Date: 04/13/2021 CLINICAL DATA:  Acute renal injury.  Hypertension. EXAM: RENAL / URINARY TRACT ULTRASOUND COMPLETE COMPARISON:  Ultrasound renal 12/24/2020. CT abdomen and pelvis 03/20/2013. FINDINGS: Right Kidney: Renal measurements: 12.2 x 7.0 x 7.1 cm = volume: 316 mL. There is no hydronephrosis. Echogenicity is within normal limits. No focal lesion identified. Left Kidney: Renal measurements: 12.9 x 6.2 x 7.0 cm = volume: 292 mL. There is no hydronephrosis. Echogenicity is within normal limits. No focal lesion identified. Bladder: The bilateral ureteral jets are not seen. Bladder wall appears diffusely thickened. There is some layering echogenic material in the dependent portion of the bladder. Other: None. IMPRESSION: 1. No hydronephrosis. 2. Diffuse bladder wall thickening. Correlate for cystitis. There is layering echogenic material in the bladder which may indicate infection or hemorrhage. Electronically Signed   By: Ronney Asters M.D.   On: 04/13/2021 15:42   DG Chest Port 1 View  Result Date:  04/13/2021 CLINICAL DATA:  Weakness. EXAM: PORTABLE CHEST 1 VIEW COMPARISON:  02/02/2021 FINDINGS: Right jugular dialysis catheter has been removed in the interim. Again noted is elevation of the right hemidiaphragm. Both lungs are clear. Heart and mediastinum are stable. Atherosclerotic calcifications at the aortic arch. Trachea is midline. Surgical clips in the right upper abdomen. IMPRESSION: 1. No acute abnormality. 2. Chronic elevation of the right hemidiaphragm. Electronically Signed   By: Markus Daft M.D.   On: 04/13/2021 13:24     CBC Recent Labs  Lab 04/12/21 1350 04/12/21 1640 04/13/21 1230 04/14/21 0634  WBC  --  11.5* 13.1* 15.7*  HGB 8.9* 9.0* 8.8* 8.0*  HCT  --  29.2* 28.5* 26.9*  PLT  --  231 247 212  MCV  --  97.7 101.1* 101.9*  MCH  --  30.1 31.2 30.3  MCHC  --  30.8* 30.9 29.7*  RDW  --  14.7 15.9* 15.9*  LYMPHSABS  --  1,392 1.3  --   MONOABS  --   --  1.0  --   EOSABS  --  115 0.1  --   BASOSABS  --  35 0.1  --     Chemistries  Recent Labs  Lab 04/12/21 1640 04/13/21 1230 04/14/21 0634 04/15/21 0551  NA 140 139 142  144 139  K 4.3 4.1 4.0  4.1 2.9*  CL 116* 117* 118*  119* 110  CO2 <10* 8* 7*  8* 19*  GLUCOSE 274* 160* 47*  47* 88  BUN 96* 112* 102*  112* 62*  CREATININE 5.00* 5.25* 5.20*  5.30* 3.46*  CALCIUM 7.1* 7.0* 7.0*  7.1* 6.2*  AST 13 14*  --   --   ALT 16 19  --   --   ALKPHOS  --  134*  --   --   BILITOT 0.3 0.2*  --   --    ------------------------------------------------------------------------------------------------------------------ No results  for input(s): CHOL, HDL, LDLCALC, TRIG, CHOLHDL, LDLDIRECT in the last 72 hours.  Lab Results  Component Value Date   HGBA1C 6.2 (H) 03/19/2021   ------------------------------------------------------------------------------------------------------------------ No results for input(s): TSH, T4TOTAL, T3FREE, THYROIDAB in the last 72 hours.  Invalid input(s):  FREET3 ------------------------------------------------------------------------------------------------------------------ Recent Labs    04/15/21 0551  FERRITIN 117  TIBC 148*  IRON 11*    Coagulation profile Recent Labs  Lab 04/13/21 1230  INR 1.5*    No results for input(s): DDIMER in the last 72 hours.  Cardiac Enzymes No results for input(s): CKMB, TROPONINI, MYOGLOBIN in the last 168 hours.  Invalid input(s): CK ------------------------------------------------------------------------------------------------------------------    Component Value Date/Time   BNP 700.0 (H) 04/13/2021 1230   BNP 202 (H) 04/01/2019 1223     Roxan Hockey M.D on 04/15/2021 at 5:31 PM  Go to [email or web address] - for contact info  [facility] - Office  [phone]

## 2021-04-15 NOTE — Progress Notes (Signed)
Dr. Josephine Cables in to see pt.  EKG obtained showing Sinus Rhythm with PACs with BBB.  No change from previous.  Will monitor glucose q2h x 2 and further assessment as indicated. Ayesha Mohair BSN RN CMSRN

## 2021-04-16 ENCOUNTER — Inpatient Hospital Stay (HOSPITAL_COMMUNITY): Payer: PPO

## 2021-04-16 ENCOUNTER — Inpatient Hospital Stay (HOSPITAL_COMMUNITY): Payer: PPO | Admitting: Anesthesiology

## 2021-04-16 ENCOUNTER — Encounter (HOSPITAL_COMMUNITY): Payer: Self-pay | Admitting: Family Medicine

## 2021-04-16 ENCOUNTER — Encounter (HOSPITAL_COMMUNITY)
Admission: EM | Disposition: A | Discharge status: Home Health | Payer: Self-pay | Source: Home / Self Care | Attending: Family Medicine

## 2021-04-16 HISTORY — PX: INSERTION OF DIALYSIS CATHETER: SHX1324

## 2021-04-16 LAB — RENAL FUNCTION PANEL
Albumin: 2 g/dL — ABNORMAL LOW (ref 3.5–5.0)
Anion gap: 13 (ref 5–15)
BUN: 72 mg/dL — ABNORMAL HIGH (ref 8–23)
CO2: 17 mmol/L — ABNORMAL LOW (ref 22–32)
Calcium: 6.3 mg/dL — CL (ref 8.9–10.3)
Chloride: 108 mmol/L (ref 98–111)
Creatinine, Ser: 4.24 mg/dL — ABNORMAL HIGH (ref 0.61–1.24)
GFR, Estimated: 13 mL/min — ABNORMAL LOW (ref >60)
Glucose, Bld: 125 mg/dL — ABNORMAL HIGH (ref 70–99)
Phosphorus: 6.8 mg/dL — ABNORMAL HIGH (ref 2.5–4.6)
Potassium: 3 mmol/L — ABNORMAL LOW (ref 3.5–5.1)
Sodium: 138 mmol/L (ref 135–145)

## 2021-04-16 LAB — PREPARE RBC (CROSSMATCH)

## 2021-04-16 LAB — GLUCOSE, CAPILLARY
Glucose-Capillary: 102 mg/dL — ABNORMAL HIGH (ref 70–99)
Glucose-Capillary: 109 mg/dL — ABNORMAL HIGH (ref 70–99)
Glucose-Capillary: 122 mg/dL — ABNORMAL HIGH (ref 70–99)
Glucose-Capillary: 127 mg/dL — ABNORMAL HIGH (ref 70–99)
Glucose-Capillary: 128 mg/dL — ABNORMAL HIGH (ref 70–99)
Glucose-Capillary: 86 mg/dL (ref 70–99)

## 2021-04-16 LAB — HEPATITIS B SURFACE ANTIBODY, QUANTITATIVE
Hep B S AB Quant (Post): <3.1 m[IU]/mL — ABNORMAL LOW (ref >9.9)
Hep B S AB Quant (Post): <3.1 m[IU]/mL — ABNORMAL LOW (ref >9.9)

## 2021-04-16 LAB — SURGICAL PCR SCREEN
MRSA, PCR: NEGATIVE
Staphylococcus aureus: POSITIVE — AB

## 2021-04-16 SURGERY — INSERTION OF DIALYSIS CATHETER
Anesthesia: General | Laterality: Right

## 2021-04-16 MED ORDER — FENTANYL CITRATE PF 50 MCG/ML IJ SOSY: 25.0000 ug | PREFILLED_SYRINGE | INTRAMUSCULAR | Status: DC | PRN

## 2021-04-16 MED ORDER — LIDOCAINE HCL (PF) 1 % IJ SOLN
INTRAMUSCULAR | Status: AC
  Filled 2021-04-16: qty 30

## 2021-04-16 MED ORDER — PROPOFOL 500 MG/50ML IV EMUL
INTRAVENOUS | Status: DC | PRN
  Administered 2021-04-16: 50 ug/kg/min via INTRAVENOUS

## 2021-04-16 MED ORDER — FENTANYL CITRATE (PF) 100 MCG/2ML IJ SOLN
INTRAMUSCULAR | Status: DC | PRN
  Administered 2021-04-16: 25 ug via INTRAVENOUS

## 2021-04-16 MED ORDER — CHLORHEXIDINE GLUCONATE 0.12 % MT SOLN
15.0000 mL | Freq: Once | OROMUCOSAL | Status: AC
  Administered 2021-04-16: 15 mL via OROMUCOSAL

## 2021-04-16 MED ORDER — PROPOFOL 10 MG/ML IV BOLUS
INTRAVENOUS | Status: DC | PRN
Start: 2021-04-16 — End: 2021-04-16
  Administered 2021-04-16 (×2): 20 mg via INTRAVENOUS

## 2021-04-16 MED ORDER — VANCOMYCIN HCL IN DEXTROSE 1-5 GM/200ML-% IV SOLN
1000.0000 mg | Freq: Once | INTRAVENOUS | Status: AC
  Administered 2021-04-16: 1000 mg via INTRAVENOUS
  Filled 2021-04-16: qty 200

## 2021-04-16 MED ORDER — SODIUM CHLORIDE 0.9 % IV SOLN: 100.0000 mL | INTRAVENOUS | Status: DC | PRN

## 2021-04-16 MED ORDER — HEPARIN SODIUM (PORCINE) 1000 UNIT/ML IJ SOLN
INTRAMUSCULAR | Status: AC
  Filled 2021-04-16: qty 4

## 2021-04-16 MED ORDER — LACTATED RINGERS IV SOLN
INTRAVENOUS | Status: DC
  Administered 2021-04-16: 1000 mL via INTRAVENOUS

## 2021-04-16 MED ORDER — HEPARIN SODIUM (PORCINE) 1000 UNIT/ML IJ SOLN
INTRAMUSCULAR | Status: DC | PRN
  Administered 2021-04-16: 4000 [IU]

## 2021-04-16 MED ORDER — LIDOCAINE HCL (PF) 1 % IJ SOLN
INTRAMUSCULAR | Status: DC | PRN
  Administered 2021-04-16: 7 mL

## 2021-04-16 MED ORDER — ONDANSETRON HCL 4 MG/2ML IJ SOLN
INTRAMUSCULAR | Status: AC
  Filled 2021-04-16: qty 2

## 2021-04-16 MED ORDER — ORAL CARE MOUTH RINSE: 15.0000 mL | Freq: Once | OROMUCOSAL | Status: AC

## 2021-04-16 MED ORDER — MUPIROCIN 2 % EX OINT
1.0000 | TOPICAL_OINTMENT | Freq: Two times a day (BID) | CUTANEOUS | Status: DC
Start: 2021-04-16 — End: 2021-04-18
  Administered 2021-04-16 – 2021-04-18 (×5): 1 via NASAL
  Filled 2021-04-16: qty 22

## 2021-04-16 MED ORDER — VANCOMYCIN HCL 1500 MG/300ML IV SOLN
1500.0000 mg | Freq: Once | INTRAVENOUS | Status: AC
  Administered 2021-04-16: 1500 mg via INTRAVENOUS
  Filled 2021-04-16: qty 300

## 2021-04-16 MED ORDER — FENTANYL CITRATE (PF) 100 MCG/2ML IJ SOLN
INTRAMUSCULAR | Status: AC
  Filled 2021-04-16: qty 2

## 2021-04-16 MED ORDER — HEPARIN SODIUM (PORCINE) 1000 UNIT/ML DIALYSIS: 1000.0000 [IU] | INTRAMUSCULAR | Status: DC | PRN

## 2021-04-16 MED ORDER — SODIUM CHLORIDE (PF) 0.9 % IJ SOLN
INTRAMUSCULAR | Status: DC | PRN
  Administered 2021-04-16: 500 mL via INTRAVENOUS

## 2021-04-16 MED ORDER — ALTEPLASE 2 MG IJ SOLR: 2.0000 mg | Freq: Once | INTRAMUSCULAR | Status: DC | PRN

## 2021-04-16 MED ORDER — CHLORHEXIDINE GLUCONATE CLOTH 2 % EX PADS: 6.0000 | MEDICATED_PAD | Freq: Every day | CUTANEOUS | Status: DC

## 2021-04-16 MED ORDER — CEFAZOLIN SODIUM-DEXTROSE 2-4 GM/100ML-% IV SOLN
2.0000 g | INTRAVENOUS | Status: AC
  Filled 2021-04-16: qty 100

## 2021-04-16 SURGICAL SUPPLY — 42 items
APPLICATOR CHLORAPREP 10.5 ORG (MISCELLANEOUS) ×2 IMPLANT
BAG DECANTER FOR FLEXI CONT (MISCELLANEOUS) ×2 IMPLANT
BIOPATCH RED 1 DISK 7.0 (GAUZE/BANDAGES/DRESSINGS) ×2 IMPLANT
CATH PALINDROME-P 19CM W/VT (CATHETERS) IMPLANT
CATH PALINDROME-P 23CM W/VT (CATHETERS) ×1 IMPLANT
COVER LIGHT HANDLE STERIS (MISCELLANEOUS) ×4 IMPLANT
COVER PROBE U/S 5X48 (MISCELLANEOUS) ×2 IMPLANT
DECANTER SPIKE VIAL GLASS SM (MISCELLANEOUS) ×4 IMPLANT
DERMABOND ADVANCED (GAUZE/BANDAGES/DRESSINGS) ×1
DERMABOND ADVANCED .7 DNX12 (GAUZE/BANDAGES/DRESSINGS) ×1 IMPLANT
DRAPE C-ARM FOLDED MOBILE STRL (DRAPES) ×2 IMPLANT
DRAPE CHEST BREAST 15X10 FENES (DRAPES) ×2 IMPLANT
DRSG SORBAVIEW 3.5X5-5/16 MED (GAUZE/BANDAGES/DRESSINGS) ×2 IMPLANT
ELECT REM PT RETURN 9FT ADLT (ELECTROSURGICAL) ×2 IMPLANT
ELECTRODE REM PT RTRN 9FT ADLT (ELECTROSURGICAL) ×1 IMPLANT
GAUZE 4X4 16PLY ~~LOC~~+RFID DBL (SPONGE) ×2 IMPLANT
GEL ULTRASOUND 20GR AQUASONIC (MISCELLANEOUS) ×2 IMPLANT
GLOVE SURG ENC MOIS LTX SZ6.5 (GLOVE) ×2 IMPLANT
GLOVE SURG UNDER POLY LF SZ6.5 (GLOVE) ×2 IMPLANT
GLOVE SURG UNDER POLY LF SZ7 (GLOVE) ×4 IMPLANT
GOWN STRL REUS W/TWL LRG LVL3 (GOWN DISPOSABLE) ×4 IMPLANT
IV CONNECTOR ONE LINK NDLESS (IV SETS) IMPLANT
IV NS 500ML (IV SOLUTION) ×1
IV NS 500ML BAXH (IV SOLUTION) ×1 IMPLANT
KIT BLADEGUARD II DBL (SET/KITS/TRAYS/PACK) ×2 IMPLANT
KIT TURNOVER KIT A (KITS) ×2 IMPLANT
MARKER SKIN DUAL TIP RULER LAB (MISCELLANEOUS) ×2 IMPLANT
NDL HYPO 18GX1.5 BLUNT FILL (NEEDLE) ×1 IMPLANT
NDL HYPO 25X1 1.5 SAFETY (NEEDLE) ×1 IMPLANT
NEEDLE HYPO 18GX1.5 BLUNT FILL (NEEDLE) ×2 IMPLANT
NEEDLE HYPO 25X1 1.5 SAFETY (NEEDLE) ×2 IMPLANT
PACK BASIC III (CUSTOM PROCEDURE TRAY) ×1
PACK SRG BSC III STRL LF ECLPS (CUSTOM PROCEDURE TRAY) ×1 IMPLANT
PAD ARMBOARD 7.5X6 YLW CONV (MISCELLANEOUS) ×2 IMPLANT
PENCIL SMOKE EVACUATOR COATED (MISCELLANEOUS) ×2 IMPLANT
SET BASIN LINEN APH (SET/KITS/TRAYS/PACK) ×2 IMPLANT
SUT MNCRL AB 4-0 PS2 18 (SUTURE) ×2 IMPLANT
SUT SILK 2 0 FSL 18 (SUTURE) ×2 IMPLANT
SUT VIC AB 3-0 SH 27 (SUTURE) ×1
SUT VIC AB 3-0 SH 27X BRD (SUTURE) ×1 IMPLANT
SYR 10ML LL (SYRINGE) ×4 IMPLANT
SYR CONTROL 10ML LL (SYRINGE) ×2 IMPLANT

## 2021-04-16 NOTE — Progress Notes (Signed)
Pharmacy Antibiotic Note  [name]. is a 80 y.o. male admitted on 04/13/2021 with UTI.  Pharmacy has been consulted for Vancomycin dosing. Patient had Dialysis catheter placed this afternoon and received dialysis Plan: Vancomycin 1529m IV loading dose given this AM, will schedule followup dose of 1003mpost HD this afternoon. F/U HD schedule for further dosing  Monitor V/S, labs and levels as indicated  Height: 5' 8"  (172.7 cm) Weight: 85.2 kg (187 lb 13.3 oz) IBW/kg (Calculated) : 68.4  Temp (24hrs), Avg:98.3 F (36.8 C), Min:98 F (36.7 C), Max:98.9 F (37.2 C)  Recent Labs  Lab 04/12/21 1640 04/13/21 1230 04/13/21 1301 04/13/21 1520 04/14/21 0634 04/15/21 0551 04/16/21 0527  WBC 11.5* 13.1*  --   --  15.7*  --   --   CREATININE 5.00* 5.25*  --   --  5.20*  5.30* 3.46* 4.24*  LATICACIDVEN  --   --  0.7 1.2  --   --   --     Estimated Creatinine Clearance: 14.8 mL/min (A) (by C-G formula based on SCr of 4.24 mg/dL (H)).    Allergies  Allergen Reactions   Enalapril Maleate Cough    Antimicrobials this admission: Vancomycin  9/12 >>  CTX 9/9 >> 9/12  Microbiology results: 9/9 BCX: ngtd 9/9 Ucx: 40K CFU/ml Staph Aureus 10K CFU/ml E.faecalis 9/12 MRSA PCR is positive  Thank you for allowing pharmacy to be a part of this patient's care.  LoIsac SarnaBS PhVena AustriaBCCalifornialinical Pharmacist Pager #[phone]/07/2021 5:46 PM

## 2021-04-16 NOTE — Anesthesia Preprocedure Evaluation (Signed)
Anesthesia Evaluation  Patient identified by MRN, date of birth, ID band Patient awake    Reviewed: Allergy & Precautions, NPO status , Patient's Chart, lab work & pertinent test results, reviewed documented beta blocker date and time   History of Anesthesia Complications Negative for: history of anesthetic complications  Airway Mallampati: II  TM Distance: <3 FB Neck ROM: Full    Dental  (+) Caps, Dental Advisory Given   Pulmonary shortness of breath and with exertion,    Pulmonary exam normal        Cardiovascular Exercise Tolerance: Poor hypertension, Pt. on home beta blockers and Pt. on medications + CAD  Normal cardiovascular exam+ dysrhythmias Supra Ventricular Tachycardia + Valvular Problems/Murmurs  Rhythm:Regular Rate:Normal  23-Dec-2020 11:58:44 [facility]-AP-ER ROUTINE RECORD June 13, 1941 (7 yr) Male Caucasian Room:ER15 Loc:499 Technician: MC Test ind: Comment 1:: Comment 2:: Comment 3:: Comment 4:: Vent. rate 79 BPM PR interval 217 ms QRS duration 136 ms QT/QTcB 429/492 ms P-R-T axes 16 -27 19 Sinus rhythm Borderline prolonged PR interval Right bundle branch block Confirmed by Nanda Quinton [phone]) on 12/23/2020 12:41:51 PM  1. Left ventricular ejection fraction, by estimation, is 60 to 65%. The left ventricle has normal function. The left ventricle has no regional wall motion abnormalities. There is mild left ventricular hypertrophy. Left ventricular diastolic parameters are consistent with Grade I diastolic dysfunction (impaired relaxation).  2. Right ventricular systolic function is normal. The right ventricular size is normal. There is normal pulmonary artery systolic pressure.  3. The mitral valve is normal in structure. No evidence of mitral valve  regurgitation. No evidence of mitral stenosis.  4. The aortic valve is tricuspid. Aortic valve regurgitation is not  visualized. Mild aortic  valve sclerosis is present, with no evidence of aortic valve stenosis.  5. Aortic dilatation noted. There is mild dilatation of the aortic root, measuring 39 mm.  6. The inferior vena cava is normal in size with greater than 50% respiratory variability, suggesting right atrial pressure of 3 mmHg.     Neuro/Psych Subdural hematoma  Neuromuscular disease negative psych ROS   GI/Hepatic negative GI ROS, (+) Cirrhosis  (NAFLD)      ,   Endo/Other  diabetes, Well Controlled, Type 2, Insulin Dependent  Renal/GU ESRF and DialysisRenal disease   Prostate cancer    Musculoskeletal  (+) Arthritis ,   Abdominal   Peds  Hematology negative hematology ROS (+)   Anesthesia Other Findings Shingles, Nose bleed this morning  Reproductive/Obstetrics negative OB ROS                             Anesthesia Physical  Anesthesia Plan  ASA: 4  Anesthesia Plan: General   Post-op Pain Management:    Induction: Intravenous  PONV Risk Score and Plan: Ondansetron and Propofol infusion  Airway Management Planned: Nasal Cannula, Natural Airway and Simple Face Mask  Additional Equipment:   Intra-op Plan:   Post-operative Plan:   Informed Consent: I have reviewed the patients History and Physical, chart, labs and discussed the procedure including the risks, benefits and alternatives for the proposed anesthesia with the patient or authorized representative who has indicated his/her understanding and acceptance.     Dental advisory given  Plan Discussed with: CRNA and Surgeon  Anesthesia Plan Comments:         Anesthesia Quick Evaluation

## 2021-04-16 NOTE — Op Note (Signed)
Operative Note 04/16/21   Preoperative Diagnosis: End Stage Renal Disease    Postoperative Diagnosis: Same   Procedure(s) Performed: Tunneled Dialysis Catheter Placement, Right Internal Jugular    Surgeon: Lanell Matar. Constance Haw, MD   Assistants: No qualified resident was available   Anesthesia: Monitored anesthesia care   Anesthesiologist: [name], MD    Specimens: None   Estimated Blood Loss: Minimal   Fluoroscopy time: 2 seconds   Blood Replacement: None    Complications: None    Operative Findings: Normal anatomy  Indications: Mr. Cousins is a 67 with worsening renal failure that needs a tunneled dialysis catheter. We discussed risk of bleeding, infection, injury to vessel, inability to get catheter due to prior catheter, pneumothorax and malfunction.   Procedure: The patient was brought into the operating room and monitored anesthesia care was induced.   The rigth chest and neck was prepped and draped in the usual sterile fashion.  Preoperative antibiotics were given.   An Ultrasound was used to verify that the right internal jugular vein was patent.  One percent lidocaine was used for local anesthesia.  The patient was measured and a 23 cm Palindrome dual lumen dialysis catheter.  The needles advanced into the right internal jugular vein using the Seldinger technique without difficulty.  A guidewire was then advanced into the right atrium under fluoroscopic guidance.  New ectopia was not noted.  The wire was secured.  An incision was made over the right chest and the catheter was tunneled to the neck.  The ultrasound again confirmed the wire was going into the vein only. Dilators were used over the wire to dilate the track.  An introducer and peel-away sheath were placed over the guidewire. The catheter was then inserted through the peel-away sheath and the peel-away sheath was removed.  A spot film was performed to confirm the position.  The catheter drew back and flushed  easily. The lumens were packed with heparin. Hemostats were used to position the catheter in the neck incision. The neck incision was closed with 4-0 Monocryl and Dermabond. The catheter was secured with 2-0 silk suture and a sterile Biopatch and dressing was applied.  Hemostasis was confirmed.   The right femoral catheter was removed and pressure was held for 10 minutes, a sterile dressing was placed.     All tape and needle counts were correct at the end of the procedure. The patient was transferred to PACU in stable condition. A chest x-ray will be performed at that time.  Curlene Labrum, MD [facility] [address] [place], [place] [address] [phone] (office)

## 2021-04-16 NOTE — Progress Notes (Signed)
[facility]  CXR ordered. Femoral line out. Wife and team updated. Plan for HD today.  Curlene Labrum, MD [facility] [address] [place], [place] [address] [phone] (office)

## 2021-04-16 NOTE — Procedures (Signed)
   HEMODIALYSIS TREATMENT NOTE:  Dialyzed s/p new RIJ TDC which tolerated prescribed flow with stable pressures.  3 hour uneventful treatment completed.  Goal met: 1 liter removed without interruption in UF.  All blood was returned.  Primary nursing service to administer Vancomycin dose post-HD -- communicated to Zenaida Deed, RN.  Rockwell Alexandria, RN

## 2021-04-16 NOTE — TOC Initial Note (Signed)
Transition of Care (TOC) - Initial/Assessment Note    Patient Details  Name: Mark LOVEDAY Sr. MRN: [id number] Date of Birth: 1941/01/25  Transition of Care [facility]) CM/SW Contact:    Boneta Lucks, RN Phone Number: 04/16/2021, 3:10 PM  Clinical Narrative:    Patient admitted with acute on chronic renal failure. Patient has a high risk for readmission. TOC spoke to his wife. He will need new chair time, they are requesting [facility] in [place]. Labs ordered and patient is getting Permacath today. New referral packet faxed today with OR note. Need to fax labs when resulted.   Wife is requesting a wheelchair, ordered and referred to [place] with [employer].   Wife is requesting SNF. [facility] choice is [facility].  PT eval is pending. TOC to follow.              Expected Discharge Plan: Skilled Nursing Facility Barriers to Discharge: Continued Medical Work up  Patient Goals and CMS Choice Patient states their goals for this hospitalization and ongoing recovery are:: agreeable to SNF CMS Medicare.gov Compare Post Acute Care list provided to:: Patient Represenative (must comment) Choice offered to / list presented to : Spouse  Expected Discharge Plan and Services Expected Discharge Plan: [place] arrangements for the past 2 months: [place]                 DME Agency: [employer] Date DME Agency Contacted: 04/16/21 Time DME Agency Contacted: [phone] Representative spoke with at DME Agency: Caryl Pina   Prior Living Arrangements/Services Living arrangements for the past 2 months: [place] Lives with:: Spouse          Need for Family Participation in Patient Care: Yes (Comment) Care giver support system in place?: Yes (comment)   Criminal Activity/Legal Involvement Pertinent to Current Situation/Hospitalization: No - Comment as needed  Activities of Daily Living Home Assistive Devices/Equipment: Eyeglasses, Hearing aid,  Walker (specify type), Raised toilet seat with rails ADL Screening (condition at time of admission) Patient's cognitive ability adequate to safely complete daily activities?: Yes Is the patient deaf or have difficulty hearing?: Yes Does the patient have difficulty seeing, even when wearing glasses/contacts?: No Does the patient have difficulty concentrating, remembering, or making decisions?: Yes Patient able to express need for assistance with ADLs?: Yes Does the patient have difficulty dressing or bathing?: Yes Independently performs ADLs?: No Communication: Independent Dressing (OT): Needs assistance Is this a change from baseline?: Change from baseline, expected to last <3days Grooming: Needs assistance Is this a change from baseline?: Change from baseline, expected to last <3 days Feeding: Needs assistance Is this a change from baseline?: Change from baseline, expected to last <3 days Bathing: Needs assistance Is this a change from baseline?: Change from baseline, expected to last <3 days Toileting: Needs assistance Is this a change from baseline?: Change from baseline, expected to last <3 days In/Out Bed: Needs assistance Is this a change from baseline?: Change from baseline, expected to last <3 days Walks in Home: Needs assistance Is this a change from baseline?: Change from baseline, expected to last <3 days Does the patient have difficulty walking or climbing stairs?: Yes Weakness of Legs: Both Weakness of Arms/Hands: None  Permission Sought/Granted      Emotional Assessment     Alcohol / Substance Use: Not Applicable Psych Involvement: No (comment)  Admission diagnosis:  Weakness [R53.1] Renal insufficiency [N28.9] AKI (acute kidney injury) ([place]) [N17.9] Patient Active Problem  List   Diagnosis Date Noted   Metabolic encephalopathy 65/99/3570   ESRD (end stage renal disease) ([place]) 02/02/2021   Hypoglycemia 02/02/2021   Sepsis ([place]) 02/02/2021   Acute lower UTI  02/02/2021   Vascular dialysis catheter in place [facility])    Shingles 01/14/2021   Pressure injury of skin 01/09/2021   SVT (supraventricular tachycardia) ([place]) 01/07/2021   Acute renal failure superimposed on stage 3b chronic kidney disease ([name]) 17/79/3903   Acute metabolic encephalopathy 00/92/3300   COVID-19 virus infection 76/22/6333   Metabolic acidosis    AKI (acute kidney injury) ([place]) 12/23/2020   Hyperkalemia 12/02/2018   Fever, unknown origin 12/02/2018   Altered mental state 12/02/2018   Acute on chronic renal failure ([place]) 12/02/2018   Pancreatic insufficiency 12/02/2018   Wears hearing aid 12/02/2018   HOH (hard of hearing) 12/02/2018   DM type 2 with diabetic peripheral neuropathy ([place]) 09/15/2018   Uncontrolled type 2 diabetes mellitus with hyperglycemia ([place]) 09/15/2018   Peripheral edema 06/25/2018   NAFLD (nonalcoholic fatty liver disease)    Diabetic retinopathy ([place])    ED (erectile dysfunction) 01/05/2014   Shortness of breath 12/05/2011   Coronary atherosclerosis of native coronary artery 12/05/2011   Type 2 diabetes mellitus ([place]) 12/05/2011   Essential hypertension, benign 12/05/2011   Mixed hyperlipidemia 12/05/2011   Malignant neoplasm of prostate ([place]) 06/26/2011   PCP:  [name], MD Pharmacy:   [facility] [place] - [address] AT [address] [place] [address] Phone: [phone] Fax: [phone]   Readmission Risk Interventions Readmission Risk Prevention Plan 04/16/2021 12/04/2018  Transportation Screening Complete Complete  PCP or Specialist Appt within 3-5 Days - (No Data)  [place] or [place] - Complete  Social Work Consult for [place] Planning/Counseling - Complete  Palliative Care Screening - Not Applicable  Medication Review Press photographer) Complete Complete  HRI or Home Care Consult Complete -  SW Recovery Care/Counseling Consult Complete -   Palliative Care Screening Not Applicable -  [place] Not Complete -  Some recent data might be hidden

## 2021-04-16 NOTE — Progress Notes (Signed)
Patient Demographics:    [name], is a 80 y.o. male, DOB - Jul 30, 1941, ZPH:[id number]  Admit date - 04/13/2021   Admitting Physician Bebe Moncure Denton Brick, MD  Outpatient Primary MD for the patient is [name], MD  LOS - 3   Chief Complaint  Patient presents with   Weakness    Tired for a couple of months        Subjective:    [name] today has no fevers, no emesis,  No chest pain,   -More sleepy breath HD today, wife at bedside  Assessment  & Plan :    Principal Problem:   Acute on chronic renal failure ([place]) Active Problems:   AKI (acute kidney injury) ([place])   ESRD (end stage renal disease) ([place])   E. Faecalis and Staph Aureus UTI   Uncontrolled type 2 diabetes mellitus with hyperglycemia ([place])   Acute metabolic encephalopathy   HOH (hard of hearing)   1)CKD5-progressed with new ESRD -patient was treated with hemodialysis from 12/29/2020 through early part of July 2022 -He was taken off hemodialysis due to renal recovery -Patient had hemodialysis on 04/14/2021 due to uremia/encephalopathy -Renal function appears to be worsening again with worsening anion gap metabolic acidosis --Renal ultrasound suggest possible cystitis, but no hydronephrosis-UA is not suggestive of UTI -Patient is voiding well Nephrology consult from nephrologist Dr. Lawson Radar appreciated -Patient Re-initiated on hemodialysis on 04/14/2021-anticipate lifelong hemodialysis -Continue bicarb replacements -s/p PermCath placement on 04/16/2021   2)Acute metabolic encephalopathy due to uremia-worse overall --confusion and disorientation from time to time most likely due to #1 above. -Mental status improving with hemodialysis sessions   Hypocalcemia- -When corrected for albumin calcium is actually around 8 -Continue calcitriol and cholecalciferol.       Anemia in CKD-  -ESA/Epogen per nephrology  team -Follow hemoglobin trend and further transfuse as clinically indicated.    Uncontrolled type 2 diabetes mellitus with renal complications I0X is 6.2 reflecting excellent Diabetic control PTA -Continue insulin regimen Use Novolog/Humalog Sliding scale insulin with Accu-Cheks/Fingersticks as ordered  -  SVT/A. fib with RVR-hold Cardizem and  hold Lopressor due to soft BP   Coronary artery disease -no chest pain symptoms at this time.   -Continue aspirin,  -Hold metoprolol due to soft BP  Penile and left lower extremity lesions suggestive of calciphylaxis--- get nephrology input  E. Faecalis and Staph Aureus UTI--- pharmacy consult for vancomycin dosing and then HD patient  -Generalized weakness and deconditioning--- awaiting physical therapy eval   Disposition/Need for in-Hospital Stay- patient unable to be discharged at this time due to awaiting PermCath placement and clipping process for outpatient hemodialysis prior to discharge  status is: Inpatient   Remains inpatient appropriate because: Disposition above   Dispo: The patient is from: Home              Anticipated d/c is to: Home  Vs SNF              Anticipated d/c date is: > 3 days              Patient currently is not medically stable to d/c. Barriers: Not Clinically Stable-    Family Communication:   Discussed with wife at bedside  Consults  :  Nephrology  DVT Prophylaxis  :   - SCDs  heparin injection 5,000 Units Start: 04/13/21 2200 SCDs Start: 04/13/21 1946 Place TED hose Start: 04/13/21 1946    Lab Results  Component Value Date   PLT 212 04/14/2021    Inpatient Medications  Scheduled Meds:  [MAR Hold] aspirin EC  81 mg Oral Q breakfast   [MAR Hold] calcitRIOL  0.25 mcg Oral Daily   [MAR Hold] calcium acetate  1,334 mg Oral TID WC   [MAR Hold] Chlorhexidine Gluconate Cloth  6 each Topical Q0600   [MAR Hold] heparin  5,000 Units Subcutaneous Q8H   [MAR Hold] insulin glargine-yfgn  6 Units  Subcutaneous Daily   [MAR Hold] lipase/protease/amylase  12,000 Units Oral TID with meals   [MAR Hold] multivitamin with minerals  1 tablet Oral Daily   [MAR Hold] mupirocin ointment  1 application Nasal BID   [MAR Hold] sodium chloride flush  3 mL Intravenous Q12H   [MAR Hold] sodium chloride flush  3 mL Intravenous Q12H   Continuous Infusions:  [MAR Hold] sodium chloride     [MAR Hold] sodium chloride     [MAR Hold] sodium chloride     sodium chloride     sodium chloride      ceFAZolin (ANCEF) IV     [MAR Hold] ferric gluconate (FERRLECIT) IVPB 125 mg (04/16/21 1700)   [MAR Hold] lactated ringers     PRN Meds:.[MAR Hold] sodium chloride, [MAR Hold] sodium chloride, [MAR Hold] sodium chloride, sodium chloride, sodium chloride, [MAR Hold] acetaminophen **OR** [MAR Hold] acetaminophen, [MAR Hold] albuterol, [MAR Hold] alteplase, alteplase, [MAR Hold] bisacodyl, fentaNYL (SUBLIMAZE) injection, [MAR Hold] heparin, heparin, [MAR Hold] ondansetron **OR** [MAR Hold] ondansetron (ZOFRAN) IV, [MAR Hold] polyethylene glycol, [MAR Hold] sodium chloride flush, [MAR Hold] traZODone    Anti-infectives (From admission, onward)    Start     Dose/Rate Route Frequency Ordered Stop   04/16/21 1230  ceFAZolin (ANCEF) IVPB 2g/100 mL premix        2 g 200 mL/hr over 30 Minutes Intravenous On call to O.R. 04/16/21 1224 04/17/21 0559   04/16/21 0915  vancomycin (VANCOREADY) IVPB 1500 mg/300 mL        1,500 mg 150 mL/hr over 120 Minutes Intravenous  Once 04/16/21 0816 04/16/21 1227   04/13/21 1300  cefTRIAXone (ROCEPHIN) 2 g in sodium chloride 0.9 % 100 mL IVPB  Status:  Discontinued        2 g 200 mL/hr over 30 Minutes Intravenous Every 24 hours 04/13/21 1257 04/16/21 0816         Objective:   Vitals:   04/16/21 1530 04/16/21 1600 04/16/21 1630 04/16/21 1700  BP: (!) 150/80 (!) 147/82 (!) 144/87 140/82  Pulse: 100 (!) 112 97 (!) 116  Resp: 19 18 16 16   Temp:      TempSrc:      SpO2:       Weight:      Height:        Wt Readings from Last 3 Encounters:  04/16/21 85.2 kg  04/12/21 80.7 kg  04/06/21 80.7 kg     Intake/Output Summary (Last 24 hours) at 04/16/2021 1732 Last data filed at 04/16/2021 1408 Gross per 24 hour  Intake 460 ml  Output 2010 ml  Net -1550 ml   Physical Exam  Gen:-Resting, in no acute distress  HEENT:- [place].AT, No sclera icterus Ears-- HOH Neck-Supple Neck,No JVD,.  Lungs-no significant rales or wheezes at this time fair symmetrical air  movement CV- S1, S2 normal, regular  Abd-  +ve B.Sounds, Abd Soft, No tenderness,    Extremity/Skin:- No  edema, pedal pulses present  Psych-occasional episodes of confusion and disorientation but overall more light neuro-generalized weakness, no new focal deficits, no tremors MSK-- Rt Femoral HD catheter   Data Review:   Micro Results Recent Results (from the past 240 hour(s))  Urine Culture     Status: None   Collection Time: 04/10/21 11:38 AM   Specimen: Urine  Result Value Ref Range Status   MICRO NUMBER: [id number]  Final   SPECIMEN QUALITY: Adequate  Final   Sample Source NOT GIVEN  Final   STATUS: FINAL  Final   ISOLATE 1:   Final    Less than 10,000 CFU/mL of single Gram positive organism isolated. No further testing will be performed. If clinically indicated, recollection using a method to minimize contamination, with prompt transfer to Urine Culture Transport Tube, is recommended.   COMMENT:   Final    Additional non-predominating organism(s) isolated. These organisms, commonly found on external and internal genitalia, are considered colonizers. No further testing performed.  MICROSCOPIC MESSAGE     Status: None   Collection Time: 04/10/21 11:38 AM  Result Value Ref Range Status   Note   Final    Comment: This urine was analyzed for the presence of WBC,  RBC, bacteria, casts, and other formed elements.  Only those elements seen were reported. . .   Blood culture (routine single)      Status: None (Preliminary result)   Collection Time: 04/13/21  1:01 PM   Specimen: BLOOD RIGHT ARM  Result Value Ref Range Status   Specimen Description BLOOD RIGHT ARM  Final   Special Requests   Final    Blood Culture adequate volume BOTTLES DRAWN AEROBIC AND ANAEROBIC   Culture   Final    NO GROWTH 3 DAYS Performed at [facility], [address], [place], [place] [address]    Report Status PENDING  Incomplete  Resp Panel by RT-PCR (Flu A&B, Covid) Nasopharyngeal Swab     Status: None   Collection Time: 04/13/21  1:15 PM   Specimen: Nasopharyngeal Swab; Nasopharyngeal(NP) swabs in vial transport medium  Result Value Ref Range Status   SARS Coronavirus 2 by RT PCR NEGATIVE NEGATIVE Final    Comment: (NOTE) SARS-CoV-2 target nucleic acids are NOT DETECTED.  The SARS-CoV-2 RNA is generally detectable in upper respiratory specimens during the acute phase of infection. The lowest concentration of SARS-CoV-2 viral copies this assay can detect is 138 copies/mL. A negative result does not preclude SARS-Cov-2 infection and should not be used as the sole basis for treatment or other patient management decisions. A negative result may occur with  improper specimen collection/handling, submission of specimen other than nasopharyngeal swab, presence of viral mutation(s) within the areas targeted by this assay, and inadequate number of viral copies(<138 copies/mL). A negative result must be combined with clinical observations, patient history, and epidemiological information. The expected result is Negative.  Fact Sheet for Patients:  EntrepreneurPulse.com.au  Fact Sheet for Healthcare Providers:  IncredibleEmployment.be  This test is no t yet approved or cleared by the [place] FDA and  has been authorized for detection and/or diagnosis of SARS-CoV-2 by FDA under an Emergency Use Authorization (EUA). This EUA will remain  in effect (meaning this  test can be used) for the duration of the COVID-19 declaration under Section 564(b)(1) of the Act, 21 U.S.C.section 360bbb-3(b)(1), unless the  authorization is terminated  or revoked sooner.       Influenza A by PCR NEGATIVE NEGATIVE Final   Influenza B by PCR NEGATIVE NEGATIVE Final    Comment: (NOTE) The Xpert Xpress SARS-CoV-2/FLU/RSV plus assay is intended as an aid in the diagnosis of influenza from Nasopharyngeal swab specimens and should not be used as a sole basis for treatment. Nasal washings and aspirates are unacceptable for Xpert Xpress SARS-CoV-2/FLU/RSV testing.  Fact Sheet for Patients: EntrepreneurPulse.com.au  Fact Sheet for Healthcare Providers: IncredibleEmployment.be  This test is not yet approved or cleared by the [place] FDA and has been authorized for detection and/or diagnosis of SARS-CoV-2 by FDA under an Emergency Use Authorization (EUA). This EUA will remain in effect (meaning this test can be used) for the duration of the COVID-19 declaration under Section 564(b)(1) of the Act, 21 U.S.C. section 360bbb-3(b)(1), unless the authorization is terminated or revoked.  Performed at [facility], [address], [place], [place] [address]   Urine Culture     Status: Abnormal (Preliminary result)   Collection Time: 04/13/21  3:01 PM   Specimen: In/Out Cath Urine  Result Value Ref Range Status   Specimen Description   Final    IN/OUT CATH URINE Performed at [facility], [address], [place], [place] [address]    Special Requests   Final    NONE Performed at [facility], [address] [place], [place] [address]    Culture (A)  Final    40,000 COLONIES/mL STAPHYLOCOCCUS AUREUS 10,000 COLONIES/mL ENTEROCOCCUS FAECALIS CULTURE REINCUBATED FOR BETTER GROWTH REISOLATING Performed at [facility], [place] [address], [place], [place] [address]    Report Status PENDING  Incomplete  Surgical PCR screen      Status: Abnormal   Collection Time: 04/16/21  2:20 AM   Specimen: Nasal Mucosa; Nasal Swab  Result Value Ref Range Status   MRSA, PCR NEGATIVE NEGATIVE Final   Staphylococcus aureus POSITIVE (A) NEGATIVE Final    Comment: (NOTE) The Xpert SA Assay (FDA approved for NASAL specimens in patients 77 years of age and older), is one component of a comprehensive surveillance program. It is not intended to diagnose infection nor to guide or monitor treatment. Performed at [facility], [address], [place], [place] [address]     Radiology Reports US RENAL  Result Date: 04/13/2021 CLINICAL DATA:  Acute renal injury.  Hypertension. EXAM: RENAL / URINARY TRACT ULTRASOUND COMPLETE COMPARISON:  Ultrasound renal 12/24/2020. CT abdomen and pelvis 03/20/2013. FINDINGS: Right Kidney: Renal measurements: 12.2 x 7.0 x 7.1 cm = volume: 316 mL. There is no hydronephrosis. Echogenicity is within normal limits. No focal lesion identified. Left Kidney: Renal measurements: 12.9 x 6.2 x 7.0 cm = volume: 292 mL. There is no hydronephrosis. Echogenicity is within normal limits. No focal lesion identified. Bladder: The bilateral ureteral jets are not seen. Bladder wall appears diffusely thickened. There is some layering echogenic material in the dependent portion of the bladder. Other: None. IMPRESSION: 1. No hydronephrosis. 2. Diffuse bladder wall thickening. Correlate for cystitis. There is layering echogenic material in the bladder which may indicate infection or hemorrhage. Electronically Signed   By: Ronney Asters M.D.   On: 04/13/2021 15:42   DG Chest Port 1 View  Result Date: 04/16/2021 CLINICAL DATA:  Dialysis catheter placement EXAM: PORTABLE CHEST 1 VIEW COMPARISON:  04/13/2021 FINDINGS: Interval placement of right chest large-bore multi lumen vascular catheter, tip projecting over the right atrium. Unchanged elevation of the  right hemidiaphragm, possibly with a small associated right pleural effusion.  IMPRESSION: Interval placement of right chest large-bore multi lumen vascular catheter, tip projecting over the right atrium. No pneumothorax. Electronically Signed   By: Eddie Candle M.D.   On: 04/16/2021 14:40   DG Chest Port 1 View  Result Date: 04/13/2021 CLINICAL DATA:  Weakness. EXAM: PORTABLE CHEST 1 VIEW COMPARISON:  02/02/2021 FINDINGS: Right jugular dialysis catheter has been removed in the interim. Again noted is elevation of the right hemidiaphragm. Both lungs are clear. Heart and mediastinum are stable. Atherosclerotic calcifications at the aortic arch. Trachea is midline. Surgical clips in the right upper abdomen. IMPRESSION: 1. No acute abnormality. 2. Chronic elevation of the right hemidiaphragm. Electronically Signed   By: Markus Daft M.D.   On: 04/13/2021 13:24   DG C-Arm 1-60 Min-No Report  Result Date: 04/16/2021 Fluoroscopy was utilized by the requesting physician.  No radiographic interpretation.     CBC Recent Labs  Lab 04/12/21 1350 04/12/21 1640 04/13/21 1230 04/14/21 0634  WBC  --  11.5* 13.1* 15.7*  HGB 8.9* 9.0* 8.8* 8.0*  HCT  --  29.2* 28.5* 26.9*  PLT  --  231 247 212  MCV  --  97.7 101.1* 101.9*  MCH  --  30.1 31.2 30.3  MCHC  --  30.8* 30.9 29.7*  RDW  --  14.7 15.9* 15.9*  LYMPHSABS  --  1,392 1.3  --   MONOABS  --   --  1.0  --   EOSABS  --  115 0.1  --   BASOSABS  --  35 0.1  --     Chemistries  Recent Labs  Lab 04/12/21 1640 04/13/21 1230 04/14/21 0634 04/15/21 0551 04/16/21 0527  NA 140 139 142  144 139 138  K 4.3 4.1 4.0  4.1 2.9* 3.0*  CL 116* 117* 118*  119* 110 108  CO2 <10* 8* 7*  8* 19* 17*  GLUCOSE 274* 160* 47*  47* 88 125*  BUN 96* 112* 102*  112* 62* 72*  CREATININE 5.00* 5.25* 5.20*  5.30* 3.46* 4.24*  CALCIUM 7.1* 7.0* 7.0*  7.1* 6.2* 6.3*  AST 13 14*  --   --   --   ALT 16 19  --   --   --   ALKPHOS  --  134*  --   --   --   BILITOT 0.3 0.2*  --   --   --     ------------------------------------------------------------------------------------------------------------------ No results for input(s): CHOL, HDL, LDLCALC, TRIG, CHOLHDL, LDLDIRECT in the last 72 hours.  Lab Results  Component Value Date   HGBA1C 6.2 (H) 03/19/2021   ------------------------------------------------------------------------------------------------------------------ No results for input(s): TSH, T4TOTAL, T3FREE, THYROIDAB in the last 72 hours.  Invalid input(s): FREET3 ------------------------------------------------------------------------------------------------------------------ Recent Labs    04/15/21 0551  FERRITIN 117  TIBC 148*  IRON 11*    Coagulation profile Recent Labs  Lab 04/13/21 1230  INR 1.5*    No results for input(s): DDIMER in the last 72 hours.  Cardiac Enzymes No results for input(s): CKMB, TROPONINI, MYOGLOBIN in the last 168 hours.  Invalid input(s): CK ------------------------------------------------------------------------------------------------------------------    Component Value Date/Time   BNP 700.0 (H) 04/13/2021 1230   BNP 202 (H) 04/01/2019 1223     Roxan Hockey M.D on 04/16/2021 at 5:32 PM  Go to [email or web address] - for contact info  [facility] - Office  [phone]

## 2021-04-16 NOTE — H&P (Signed)
[facility] History and Physical  Reason for Referral: Tunneled dialysis catheter  Referring Physician: Nephrology  Chief Complaint   Weakness     Mark Jumbo Sr. is a 80 y.o. male.  HPI: [name] is a 80 yo known to me with a prior tunneled catheter placed that had this removed with improved renal function who now needs another tunneled catheter placed. He had a femoral line placed over the weekend for dialysis. He presented to the hospital with worsening renal function and encephalopathy.  Nephrology referred him for tunneled catheter placement while he awaits fistula placement.   Past Medical History:  Diagnosis Date   Arthritis    lower back   Chronic back pain    Disc disease   Chronic kidney disease    Coronary atherosclerosis of native coronary artery    Coronary calcifications by chest CT, Myoview demonstrating inferolateral scar   Deafness in right ear    Diabetes mellitus    Diabetic retinopathy (HCC)    moderate nonproliferative with macular edema in right eye   Diastolic dysfunction 01/2262   Grade 1.   Essential hypertension, benign    Heart murmur    in past   HOH (hard of hearing)    Hyperkalemia    Kidney stones    Mixed hyperlipidemia    NAFLD (nonalcoholic fatty liver disease)    Pancreatic insufficiency    Diagnosed at [facility]   Prostate cancer [facility]) 2011   Shortness of breath dyspnea    occasional - liver pressing on right lung, decreased capacity   Subdural hematoma (HCC)    Type 2 diabetes mellitus ([place])    Wears hearing aid    "crossover" form right to left    Past Surgical History:  Procedure Laterality Date   APPENDECTOMY     Bilateral hip replacement     CATARACT EXTRACTION W/PHACO Left 05/15/2015   Procedure: CATARACT EXTRACTION PHACO AND INTRAOCULAR LENS PLACEMENT ([place]);  Surgeon: Ronnell Freshwater, MD;  Location: [place];  Service: Ophthalmology;  Laterality: Left;  DIABETIC - insulin pump and  oral meds   CATARACT EXTRACTION W/PHACO Right 08/28/2015   Procedure: CATARACT EXTRACTION PHACO AND INTRAOCULAR LENS PLACEMENT ([facility]);  Surgeon: Ronnell Freshwater, MD;  Location: [place];  Service: Ophthalmology;  Laterality: Right;  DIABETIC PER PT AND CINDY PLEASE KEEP ARRIVAL TIME AFTER 8AM    CHOLECYSTECTOMY     HERNIA REPAIR     INSERTION OF DIALYSIS CATHETER Right 01/22/2021   Procedure: INSERTION OF DIALYSIS CATHETER;  Surgeon: Virl Cagey, MD;  Location: [facility];  Service: General;  Laterality: Right;   PROSTATECTOMY  2011    Family History  Problem Relation Age of Onset   Diabetes type II Mother    Heart attack Father     Social History   Tobacco Use   Smoking status: Never   Smokeless tobacco: Never  Substance Use Topics   Alcohol use: No   Drug use: No    Medications: I have reviewed the patient's current medications. Prior to Admission:  Medications Prior to Admission  Medication Sig Dispense Refill Last Dose   aspirin EC 81 MG tablet Take 81 mg by mouth at bedtime.    04/13/2021   diltiazem (CARDIZEM CD) 240 MG 24 hr capsule Take 1 capsule (240 mg total) by mouth daily. 90 capsule 1 04/13/2021   furosemide (LASIX) 40 MG tablet Take 40 mg by mouth daily.   04/13/2021  insulin aspart (NOVOLOG) 100 UNIT/ML injection Inject 8 Units into the skin 3 (three) times daily with meals. (Patient taking differently: Inject 10 Units into the skin in the morning.) 10 mL 11 04/13/2021   insulin glargine (LANTUS) 100 UNIT/ML injection Inject 0.12 mLs (12 Units total) into the skin at bedtime. 10 mL 11 04/13/2021   metoprolol tartrate (LOPRESSOR) 50 MG tablet Take 1 tablet (50 mg total) by mouth 2 (two) times daily. 180 tablet 1 04/13/2021 at 0900   Multiple Vitamin (MULTIVITAMIN WITH MINERALS) TABS Take 1 tablet by mouth daily.   04/13/2021   pravastatin (PRAVACHOL) 10 MG tablet TAKE 1 TABLET(10 MG) BY MOUTH DAILY WITH BREAKFAST (Patient taking differently: Take 10 mg by  mouth daily.) 90 tablet 1 04/13/2021   sodium bicarbonate 650 MG tablet Take 650 mg by mouth daily.   04/13/2021   ZENPEP 10000-32000 units CPEP TAKE 1 CAPSULE BY MOUTH THREE TIMES DAILY BEFORE MEALS (Patient taking differently: Take 1 capsule by mouth with breakfast, with lunch, and with evening meal.) 270 capsule 2 04/13/2021   calcitRIOL (ROCALTROL) 0.25 MCG capsule Take 1 capsule (0.25 mcg total) by mouth daily. (Patient not taking: No sig reported) 90 capsule 1 Not Taking   calcium acetate (PHOSLO) 667 MG capsule Take 2 capsules (1,334 mg total) by mouth 3 (three) times daily with meals. (Patient not taking: No sig reported) 540 capsule 1 Not Taking   cephALEXin (KEFLEX) 500 MG capsule Take 1 capsule (500 mg total) by mouth 3 (three) times daily. 21 capsule 0    Scheduled:  [MAR Hold] aspirin EC  81 mg Oral Q breakfast   [MAR Hold] calcitRIOL  0.25 mcg Oral Daily   [MAR Hold] calcium acetate  1,334 mg Oral TID WC   [MAR Hold] Chlorhexidine Gluconate Cloth  6 each Topical Q0600   [MAR Hold] heparin  5,000 Units Subcutaneous Q8H   [MAR Hold] insulin glargine-yfgn  6 Units Subcutaneous Daily   [MAR Hold] lipase/protease/amylase  12,000 Units Oral TID with meals   [MAR Hold] multivitamin with minerals  1 tablet Oral Daily   [MAR Hold] mupirocin ointment  1 application Nasal BID   [MAR Hold] sodium chloride flush  3 mL Intravenous Q12H   [MAR Hold] sodium chloride flush  3 mL Intravenous Q12H   Continuous:  [MAR Hold] sodium chloride     [MAR Hold] sodium chloride     [MAR Hold] sodium chloride     [MAR Hold] ferric gluconate (FERRLECIT) IVPB     [MAR Hold] lactated ringers     vancomycin 1,500 mg (04/16/21 1027)   PRN:[MAR Hold] sodium chloride, [MAR Hold] sodium chloride, [MAR Hold] sodium chloride, [MAR Hold] acetaminophen **OR** [MAR Hold] acetaminophen, [MAR Hold] albuterol, [MAR Hold] alteplase, [MAR Hold] bisacodyl, [MAR Hold] heparin, [MAR Hold] ondansetron **OR** [MAR Hold] ondansetron  (ZOFRAN) IV, [MAR Hold] polyethylene glycol, [MAR Hold] sodium chloride flush, [MAR Hold] traZODone  Allergies  Allergen Reactions   Enalapril Maleate Cough      ROS:  Genitourinary:negative, worsening renal function Musculoskeletal:negative, bilateral foot pain  Blood pressure (!) 149/97, pulse (!) 101, temperature 98.2 F (36.8 C), resp. rate 20, height 5' 8"  (1.727 m), weight 85.2 kg, SpO2 98 %. Physical Exam Vitals reviewed.  Constitutional:      Appearance: Normal appearance.  HENT:     Head: Normocephalic.     Nose: Nose normal.  Eyes:     Extraocular Movements: Extraocular movements intact.  Cardiovascular:     Rate and Rhythm:  Normal rate.  Pulmonary:     Effort: Pulmonary effort is normal.  Abdominal:     General: There is no distension.     Palpations: Abdomen is soft.     Tenderness: There is no abdominal tenderness.  Musculoskeletal:        General: Normal range of motion.  Skin:    General: Skin is warm.  Neurological:     General: No focal deficit present.     Mental Status: He is alert. He is disoriented.  Psychiatric:        Mood and Affect: Mood normal.        Behavior: Behavior normal.        Thought Content: Thought content normal.        Judgment: Judgment normal.    Results: Results for orders placed or performed during the hospital encounter of 04/13/21 (from the past 48 hour(s))  Glucose, capillary     Status: Abnormal   Collection Time: 04/14/21  4:20 PM  Result Value Ref Range   Glucose-Capillary 142 (H) 70 - 99 mg/dL    Comment: Glucose reference range applies only to samples taken after fasting for at least 8 hours.  Hepatitis B surface antigen     Status: None   Collection Time: 04/14/21  7:00 PM  Result Value Ref Range   Hepatitis B Surface Ag NON REACTIVE NON REACTIVE    Comment: Performed at [facility] [address], [place], [place] [address]  Hepatitis B core antibody, total     Status: None   Collection Time:  04/14/21  7:00 PM  Result Value Ref Range   Hep B Core Total Ab NON REACTIVE NON REACTIVE    Comment: Performed at [facility] [address] [place], [place] [address]  Glucose, capillary     Status: None   Collection Time: 04/14/21  9:01 PM  Result Value Ref Range   Glucose-Capillary 94 70 - 99 mg/dL    Comment: Glucose reference range applies only to samples taken after fasting for at least 8 hours.  Glucose, capillary     Status: Abnormal   Collection Time: 04/15/21 12:02 AM  Result Value Ref Range   Glucose-Capillary 39 (LL) 70 - 99 mg/dL    Comment: Glucose reference range applies only to samples taken after fasting for at least 8 hours.   Comment 1 Document in Chart   Glucose, capillary     Status: Abnormal   Collection Time: 04/15/21 12:13 AM  Result Value Ref Range   Glucose-Capillary 165 (H) 70 - 99 mg/dL    Comment: Glucose reference range applies only to samples taken after fasting for at least 8 hours.  Glucose, capillary     Status: None   Collection Time: 04/15/21  1:39 AM  Result Value Ref Range   Glucose-Capillary 84 70 - 99 mg/dL    Comment: Glucose reference range applies only to samples taken after fasting for at least 8 hours.  Glucose, capillary     Status: None   Collection Time: 04/15/21  2:56 AM  Result Value Ref Range   Glucose-Capillary 72 70 - 99 mg/dL    Comment: Glucose reference range applies only to samples taken after fasting for at least 8 hours.  Glucose, capillary     Status: None   Collection Time: 04/15/21  3:50 AM  Result Value Ref Range   Glucose-Capillary 72 70 - 99 mg/dL    Comment: Glucose reference range  applies only to samples taken after fasting for at least 8 hours.  Renal function panel     Status: Abnormal   Collection Time: 04/15/21  5:51 AM  Result Value Ref Range   Sodium 139 135 - 145 mmol/L   Potassium 2.9 (L) 3.5 - 5.1 mmol/L    Comment: DELTA CHECK NOTED   Chloride 110 98 - 111 mmol/L   CO2 19 (L) 22 - 32  mmol/L   Glucose, Bld 88 70 - 99 mg/dL    Comment: Glucose reference range applies only to samples taken after fasting for at least 8 hours.   BUN 62 (H) 8 - 23 mg/dL   Creatinine, Ser 3.46 (H) 0.61 - 1.24 mg/dL   Calcium 6.2 (LL) 8.9 - 10.3 mg/dL    Comment: CRITICAL RESULT CALLED TO, READ BACK BY AND VERIFIED WITH: EIDSON,S @ 1191 04/15/21 BY STEPHTR    Phosphorus 6.0 (H) 2.5 - 4.6 mg/dL   Albumin 2.0 (L) 3.5 - 5.0 g/dL   GFR, Estimated 17 (L) >60 mL/min    Comment: (NOTE) Calculated using the CKD-EPI Creatinine Equation (2021)    Anion gap 10 5 - 15    Comment: Performed at [facility], [address] [place], [place] [address]  Iron and TIBC     Status: Abnormal   Collection Time: 04/15/21  5:51 AM  Result Value Ref Range   Iron 11 (L) 45 - 182 ug/dL   TIBC 148 (L) 250 - 450 ug/dL   Saturation Ratios 7 (L) 17.9 - 39.5 %   UIBC 137 ug/dL    Comment: Performed at [facility], [address], [place], [place] [address]  Ferritin     Status: None   Collection Time: 04/15/21  5:51 AM  Result Value Ref Range   Ferritin 117 24 - 336 ng/mL    Comment: Performed at [facility], [address] [place], [place] [address]  Hepatitis B core antibody, total     Status: None   Collection Time: 04/15/21  5:51 AM  Result Value Ref Range   Hep B Core Total Ab NON REACTIVE NON REACTIVE    Comment: Performed at [facility] [address], [place], [place] [address]  Glucose, capillary     Status: None   Collection Time: 04/15/21  6:25 AM  Result Value Ref Range   Glucose-Capillary 82 70 - 99 mg/dL    Comment: Glucose reference range applies only to samples taken after fasting for at least 8 hours.  Glucose, capillary     Status: None   Collection Time: 04/15/21  7:25 AM  Result Value Ref Range   Glucose-Capillary 82 70 - 99 mg/dL    Comment: Glucose reference range applies only to samples taken after fasting for at least 8 hours.  Glucose, capillary     Status: Abnormal    Collection Time: 04/15/21 11:35 AM  Result Value Ref Range   Glucose-Capillary 126 (H) 70 - 99 mg/dL    Comment: Glucose reference range applies only to samples taken after fasting for at least 8 hours.  Glucose, capillary     Status: Abnormal   Collection Time: 04/15/21  4:13 PM  Result Value Ref Range   Glucose-Capillary 111 (H) 70 - 99 mg/dL    Comment: Glucose reference range applies only to samples taken after fasting for at least 8 hours.  Glucose, capillary     Status: Abnormal   Collection Time: 04/15/21 11:33 PM  Result  Value Ref Range   Glucose-Capillary 145 (H) 70 - 99 mg/dL    Comment: Glucose reference range applies only to samples taken after fasting for at least 8 hours.   Comment 1 Notify RN    Comment 2 Document in Chart   Surgical PCR screen     Status: Abnormal   Collection Time: 04/16/21  2:20 AM   Specimen: Nasal Mucosa; Nasal Swab  Result Value Ref Range   MRSA, PCR NEGATIVE NEGATIVE   Staphylococcus aureus POSITIVE (A) NEGATIVE    Comment: (NOTE) The Xpert SA Assay (FDA approved for NASAL specimens in patients 73 years of age and older), is one component of a comprehensive surveillance program. It is not intended to diagnose infection nor to guide or monitor treatment. Performed at [facility], [address], [place], [place] [address]   Renal function panel     Status: Abnormal   Collection Time: 04/16/21  5:27 AM  Result Value Ref Range   Sodium 138 135 - 145 mmol/L   Potassium 3.0 (L) 3.5 - 5.1 mmol/L   Chloride 108 98 - 111 mmol/L   CO2 17 (L) 22 - 32 mmol/L   Glucose, Bld 125 (H) 70 - 99 mg/dL    Comment: Glucose reference range applies only to samples taken after fasting for at least 8 hours.   BUN 72 (H) 8 - 23 mg/dL   Creatinine, Ser 4.24 (H) 0.61 - 1.24 mg/dL   Calcium 6.3 (LL) 8.9 - 10.3 mg/dL    Comment: CRITICAL RESULT CALLED TO, READ BACK BY AND VERIFIED WITH: STACEY EIDSON @ [phone] ON 04/16/21 C VARNER    Phosphorus 6.8 (H) 2.5 - 4.6  mg/dL   Albumin 2.0 (L) 3.5 - 5.0 g/dL   GFR, Estimated 13 (L) >60 mL/min    Comment: (NOTE) Calculated using the CKD-EPI Creatinine Equation (2021)    Anion gap 13 5 - 15    Comment: Performed at [facility], [address] [place], [place] [address]  Type and screen [facility]     Status: None (Preliminary result)   Collection Time: 04/16/21  5:28 AM  Result Value Ref Range   ABO/RH(D) O POS    Antibody Screen NEG    Sample Expiration 04/19/2021,2359    Unit Number [id number]    Blood Component Type RED CELLS,LR    Unit division 00    Status of Unit ALLOCATED    Transfusion Status OK TO TRANSFUSE    Crossmatch Result      Compatible Performed at [facility], [address] [place], [place] [address]   Prepare RBC (crossmatch)     Status: None   Collection Time: 04/16/21  5:28 AM  Result Value Ref Range   Order Confirmation      ORDER PROCESSED BY BLOOD BANK Performed at [facility], [address], [place],  [address]   Glucose, capillary     Status: Abnormal   Collection Time: 04/16/21  6:00 AM  Result Value Ref Range   Glucose-Capillary 127 (H) 70 - 99 mg/dL    Comment: Glucose reference range applies only to samples taken after fasting for at least 8 hours.   Comment 1 Notify RN    Comment 2 Document in Chart   Glucose, capillary     Status: Abnormal   Collection Time: 04/16/21 10:22 AM  Result Value Ref Range   Glucose-Capillary 122 (H) 70 - 99 mg/dL    Comment: Glucose reference range applies only  to samples taken after fasting for at least 8 hours.  Glucose, capillary     Status: Abnormal   Collection Time: 04/16/21 11:37 AM  Result Value Ref Range   Glucose-Capillary 109 (H) 70 - 99 mg/dL    Comment: Glucose reference range applies only to samples taken after fasting for at least 8 hours.       Assessment & Plan:  AARIV MEDLOCK Sr. is a 80 y.o. male with worsening renal failure who needs another catheter placed. Dr. Arnoldo Morale has  discussed with patient and daughter yesterday and I discussed it again with the patient.  Discussed risk of bleeding, infection, pneumothorax, malfunction and vessel injury.    All questions were answered to the satisfaction of the patient.     Virl Cagey 04/16/2021, 12:22 PM

## 2021-04-16 NOTE — Anesthesia Postprocedure Evaluation (Signed)
Anesthesia Post Note  Patient: Mark Jumbo Sr.  Procedure(s) Performed: INSERTION OF DIALYSIS CATHETER (Right)  Patient location during evaluation: Phase II Anesthesia Type: General Level of consciousness: awake Pain management: pain level controlled Vital Signs Assessment: post-procedure vital signs reviewed and stable Respiratory status: spontaneous breathing and respiratory function stable Cardiovascular status: blood pressure returned to baseline and stable Postop Assessment: no headache and no apparent nausea or vomiting Anesthetic complications: no Comments: Late entry   No notable events documented.   Last Vitals:  Vitals:   04/16/21 1415 04/16/21 1445  BP: (!) 149/76 (!) 145/69  Pulse: (!) 126 86  Resp: 17 17  Temp:    SpO2: 98% 96%    Last Pain:  Vitals:   04/16/21 1445  TempSrc:   PainSc: 0-No pain                 [name]

## 2021-04-16 NOTE — Transfer of Care (Signed)
Immediate Anesthesia Transfer of Care Note  Patient: Mark Jumbo Sr.  Procedure(s) Performed: INSERTION OF DIALYSIS CATHETER (Right)  Patient Location: PACU  Anesthesia Type:General  Level of Consciousness: awake  Airway & Oxygen Therapy: Patient Spontanous Breathing  Post-op Assessment: Report given to RN  Post vital signs: Reviewed and stable  Last Vitals:  Vitals Value Taken Time  BP    Temp    Pulse 93 04/16/21 1408  Resp 16 04/16/21 1408  SpO2 97 % 04/16/21 1408  Vitals shown include unvalidated device data.  Last Pain:  Vitals:   04/16/21 1239  TempSrc: Oral  PainSc: 4       Patients Stated Pain Goal: 5 (63/33/54 5625)  Complications: No notable events documented.

## 2021-04-16 NOTE — Progress Notes (Signed)
Patient ID: [name]., male   DOB: [birth year], 80 y.o.   MRN: [id number] S: Complaining of bilateral foot pain which kept him up last night. O:BP (!) 149/97 (BP Location: Right Arm)   Pulse (!) 101   Temp 98.2 F (36.8 C)   Resp 20   Ht 5' 8"  (1.727 m)   Wt 85.2 kg   SpO2 98%   BMI 28.56 kg/m   Intake/Output Summary (Last 24 hours) at 04/16/2021 0918 Last data filed at 04/16/2021 0622 Gross per 24 hour  Intake 480 ml  Output 1400 ml  Net -920 ml   Intake/Output: I/O last 3 completed shifts: In: 65 [P.O.:920] Out: 2254 [Urine:2250; Stool:4]  Intake/Output this shift:  No intake/output data recorded. Weight change: -1.1 kg Gen:NAD CVS: tachy Resp: CTA bilaterally Abd: +BS, soft, NT/ND Ext: no edema  Recent Labs  Lab 04/12/21 1640 04/13/21 1230 04/14/21 0634 04/15/21 0551 04/16/21 0527  NA 140 139 142  144 139 138  K 4.3 4.1 4.0  4.1 2.9* 3.0*  CL 116* 117* 118*  119* 110 108  CO2 <10* 8* 7*  8* 19* 17*  GLUCOSE 274* 160* 47*  47* 88 125*  BUN 96* 112* 102*  112* 62* 72*  CREATININE 5.00* 5.25* 5.20*  5.30* 3.46* 4.24*  ALBUMIN  --  2.6* 2.4* 2.0* 2.0*  CALCIUM 7.1* 7.0* 7.0*  7.1* 6.2* 6.3*  PHOS  --   --  11.9* 6.0* 6.8*  AST 13 14*  --   --   --   ALT 16 19  --   --   --    Liver Function Tests: Recent Labs  Lab 04/12/21 1640 04/13/21 1230 04/13/21 1230 04/14/21 0634 04/15/21 0551 04/16/21 0527  AST 13 14*  --   --   --   --   ALT 16 19  --   --   --   --   ALKPHOS  --  134*  --   --   --   --   BILITOT 0.3 0.2*  --   --   --   --   PROT 6.2 6.7  --   --   --   --   ALBUMIN  --  2.6*   < > 2.4* 2.0* 2.0*   < > = values in this interval not displayed.   No results for input(s): LIPASE, AMYLASE in the last 168 hours. No results for input(s): AMMONIA in the last 168 hours. CBC: Recent Labs  Lab 04/12/21 1640 04/13/21 1230 04/14/21 0634  WBC 11.5* 13.1* 15.7*  NEUTROABS 8,924* 10.5*  --   HGB 9.0* 8.8* 8.0*  HCT 29.2* 28.5*  26.9*  MCV 97.7 101.1* 101.9*  PLT 231 247 212   Cardiac Enzymes: No results for input(s): CKTOTAL, CKMB, CKMBINDEX, TROPONINI in the last 168 hours. CBG: Recent Labs  Lab 04/15/21 0725 04/15/21 1135 04/15/21 1613 04/15/21 2333 04/16/21 0600  GLUCAP 82 126* 111* 145* 127*    Iron Studies:  Recent Labs    04/15/21 0551  IRON 11*  TIBC 148*  FERRITIN 117   Studies/Results: No results found.  aspirin EC  81 mg Oral Q breakfast   calcitRIOL  0.25 mcg Oral Daily   calcium acetate  1,334 mg Oral TID WC   Chlorhexidine Gluconate Cloth  6 each Topical Once   Chlorhexidine Gluconate Cloth  6 each Topical Q0600   heparin  5,000 Units Subcutaneous Q8H   insulin glargine-yfgn  6 Units Subcutaneous Daily   lipase/protease/amylase  12,000 Units Oral TID with meals   multivitamin with minerals  1 tablet Oral Daily   mupirocin ointment  1 application Nasal BID   sodium chloride flush  3 mL Intravenous Q12H   sodium chloride flush  3 mL Intravenous Q12H    BMET    Component Value Date/Time   NA 138 04/16/2021 0527   K 3.0 (L) 04/16/2021 0527   CL 108 04/16/2021 0527   CO2 17 (L) 04/16/2021 0527   GLUCOSE 125 (H) 04/16/2021 0527   BUN 72 (H) 04/16/2021 0527   CREATININE 4.24 (H) 04/16/2021 0527   CREATININE 5.00 (H) 04/12/2021 1640   CALCIUM 6.3 (LL) 04/16/2021 0527   GFRNONAA 13 (L) 04/16/2021 0527   GFRNONAA 36 (L) 12/30/2019 1143   GFRAA 42 (L) 12/30/2019 1143   CBC    Component Value Date/Time   WBC 15.7 (H) 04/14/2021 0634   RBC 2.64 (L) 04/14/2021 0634   HGB 8.0 (L) 04/14/2021 0634   HCT 26.9 (L) 04/14/2021 0634   PLT 212 04/14/2021 0634   MCV 101.9 (H) 04/14/2021 0634   MCH 30.3 04/14/2021 0634   MCHC 29.7 (L) 04/14/2021 0634   RDW 15.9 (H) 04/14/2021 0634   LYMPHSABS 1.3 04/13/2021 1230   MONOABS 1.0 04/13/2021 1230   EOSABS 0.1 04/13/2021 1230   BASOSABS 0.1 04/13/2021 1230     Assessment/Plan:  Progressive CKD Stage V, now ESRD- presented with  worsening renal function, acidosis, and uremia.  S/p HD 9/10 and due for HD again today after [facility] placement.  Will need outpatient HD to be arranged and will need AVF/AVG placement.  Will see if Dr. Donnetta Hutching can operate this week. Anemia - due to ESRD, cont with ESA and follow H/H. HTN/volume - stable Hypokalemia - will use added K bath CKD-MBD -  on calcitriol and phoslo.  Severe protein malnutrition - supplement Bilateral foot pain - unclear etiology.  Has pulses bilaterally, no ulcerations.  Will check uric acid level.  SVT/A fib with RVR per primary.  Donetta Potts, MD [employer] [phone]

## 2021-04-17 ENCOUNTER — Encounter (HOSPITAL_COMMUNITY): Payer: Self-pay | Admitting: General Surgery

## 2021-04-17 LAB — RENAL FUNCTION PANEL
Albumin: 1.9 g/dL — ABNORMAL LOW (ref 3.5–5.0)
Anion gap: 14 (ref 5–15)
BUN: 40 mg/dL — ABNORMAL HIGH (ref 8–23)
CO2: 23 mmol/L (ref 22–32)
Calcium: 7 mg/dL — ABNORMAL LOW (ref 8.9–10.3)
Chloride: 102 mmol/L (ref 98–111)
Creatinine, Ser: 2.7 mg/dL — ABNORMAL HIGH (ref 0.61–1.24)
GFR, Estimated: 23 mL/min — ABNORMAL LOW (ref >60)
Glucose, Bld: 105 mg/dL — ABNORMAL HIGH (ref 70–99)
Phosphorus: 4.6 mg/dL (ref 2.5–4.6)
Potassium: 3.3 mmol/L — ABNORMAL LOW (ref 3.5–5.1)
Sodium: 139 mmol/L (ref 135–145)

## 2021-04-17 LAB — GLUCOSE, CAPILLARY
Glucose-Capillary: 179 mg/dL — ABNORMAL HIGH (ref 70–99)
Glucose-Capillary: 181 mg/dL — ABNORMAL HIGH (ref 70–99)
Glucose-Capillary: 221 mg/dL — ABNORMAL HIGH (ref 70–99)
Glucose-Capillary: 97 mg/dL (ref 70–99)

## 2021-04-17 LAB — CBC
HCT: 25.8 % — ABNORMAL LOW (ref 39.0–52.0)
Hemoglobin: 8.1 g/dL — ABNORMAL LOW (ref 13.0–17.0)
MCH: 30.5 pg (ref 26.0–34.0)
MCHC: 31.4 g/dL (ref 30.0–36.0)
MCV: 97 fL (ref 80.0–100.0)
Platelets: 245 10*3/uL (ref 150–400)
RBC: 2.66 MIL/uL — ABNORMAL LOW (ref 4.22–5.81)
RDW: 15.9 % — ABNORMAL HIGH (ref 11.5–15.5)
WBC: 11 10*3/uL — ABNORMAL HIGH (ref 4.0–10.5)
nRBC: 0 % (ref 0.0–0.2)

## 2021-04-17 MED ORDER — POTASSIUM CHLORIDE CRYS ER 20 MEQ PO TBCR
40.0000 meq | EXTENDED_RELEASE_TABLET | Freq: Once | ORAL | Status: AC
  Administered 2021-04-17: 40 meq via ORAL
  Filled 2021-04-17: qty 2

## 2021-04-17 MED ORDER — VANCOMYCIN HCL IN DEXTROSE 1-5 GM/200ML-% IV SOLN
1000.0000 mg | INTRAVENOUS | Status: DC
  Administered 2021-04-18: 1000 mg via INTRAVENOUS
  Filled 2021-04-17: qty 200

## 2021-04-17 NOTE — Progress Notes (Signed)
Patient ID: Mark Hose., male   DOB: 09-20-1940, 80 y.o.   MRN: [id number] S: Feels better today.  Tolerated RIJ TDC placement and HD yesterday.  Left leg still hurts but is better than yesterday. O:BP (!) 158/83 (BP Location: Right Arm)   Pulse 96   Temp 98.6 F (37 C) (Oral)   Resp 20   Ht 5' 8"  (1.727 m)   Wt 85.6 kg   SpO2 98%   BMI 28.69 kg/m   Intake/Output Summary (Last 24 hours) at 04/17/2021 0900 Last data filed at 04/17/2021 0340 Gross per 24 hour  Intake 100 ml  Output 1010 ml  Net -910 ml   Intake/Output: I/O last 3 completed shifts: In: 220 [P.O.:120; I.V.:100] Out: 1610 [Urine:1600; Blood:10]  Intake/Output this shift:  No intake/output data recorded. Weight change: 0.4 kg Gen: NAD CVS:RRR, no rub Resp: CTA Abd: +BS, soft, NT/ND Ext: no edema, some induration and tenderness along left calf.  Recent Labs  Lab 04/12/21 1640 04/13/21 1230 04/14/21 0634 04/15/21 0551 04/16/21 0527 04/17/21 0632  NA 140 139 142  144 139 138 139  K 4.3 4.1 4.0  4.1 2.9* 3.0* 3.3*  CL 116* 117* 118*  119* 110 108 102  CO2 <10* 8* 7*  8* 19* 17* 23  GLUCOSE 274* 160* 47*  47* 88 125* 105*  BUN 96* 112* 102*  112* 62* 72* 40*  CREATININE 5.00* 5.25* 5.20*  5.30* 3.46* 4.24* 2.70*  ALBUMIN  --  2.6* 2.4* 2.0* 2.0* 1.9*  CALCIUM 7.1* 7.0* 7.0*  7.1* 6.2* 6.3* 7.0*  PHOS  --   --  11.9* 6.0* 6.8* 4.6  AST 13 14*  --   --   --   --   ALT 16 19  --   --   --   --    Liver Function Tests: Recent Labs  Lab 04/12/21 1640 04/13/21 1230 04/14/21 0634 04/15/21 0551 04/16/21 0527 04/17/21 0632  AST 13 14*  --   --   --   --   ALT 16 19  --   --   --   --   ALKPHOS  --  134*  --   --   --   --   BILITOT 0.3 0.2*  --   --   --   --   PROT 6.2 6.7  --   --   --   --   ALBUMIN  --  2.6*   < > 2.0* 2.0* 1.9*   < > = values in this interval not displayed.   No results for input(s): LIPASE, AMYLASE in the last 168 hours. No results for input(s): AMMONIA in the  last 168 hours. CBC: Recent Labs  Lab 04/12/21 1640 04/13/21 1230 04/14/21 0634 04/17/21 0632  WBC 11.5* 13.1* 15.7* 11.0*  NEUTROABS 8,924* 10.5*  --   --   HGB 9.0* 8.8* 8.0* 8.1*  HCT 29.2* 28.5* 26.9* 25.8*  MCV 97.7 101.1* 101.9* 97.0  PLT 231 247 212 245   Cardiac Enzymes: No results for input(s): CKTOTAL, CKMB, CKMBINDEX, TROPONINI in the last 168 hours. CBG: Recent Labs  Lab 04/16/21 1137 04/16/21 1221 04/16/21 1759 04/16/21 2350 04/17/21 0621  GLUCAP 109* 102* 86 128* 97    Iron Studies:  Recent Labs    04/15/21 0551  IRON 11*  TIBC 148*  FERRITIN 117   Studies/Results: DG Chest Port 1 View  Result Date: 04/16/2021 CLINICAL DATA:  Dialysis catheter placement EXAM:  PORTABLE CHEST 1 VIEW COMPARISON:  04/13/2021 FINDINGS: Interval placement of right chest large-bore multi lumen vascular catheter, tip projecting over the right atrium. Unchanged elevation of the right hemidiaphragm, possibly with a small associated right pleural effusion. IMPRESSION: Interval placement of right chest large-bore multi lumen vascular catheter, tip projecting over the right atrium. No pneumothorax. Electronically Signed   By: Eddie Candle M.D.   On: 04/16/2021 14:40   DG C-Arm 1-60 Min-No Report  Result Date: 04/16/2021 Fluoroscopy was utilized by the requesting physician.  No radiographic interpretation.    aspirin EC  81 mg Oral Q breakfast   calcitRIOL  0.25 mcg Oral Daily   calcium acetate  1,334 mg Oral TID WC   Chlorhexidine Gluconate Cloth  6 each Topical Q0600   heparin  5,000 Units Subcutaneous Q8H   insulin glargine-yfgn  6 Units Subcutaneous Daily   lipase/protease/amylase  12,000 Units Oral TID with meals   multivitamin with minerals  1 tablet Oral Daily   mupirocin ointment  1 application Nasal BID   potassium chloride  40 mEq Oral Once   sodium chloride flush  3 mL Intravenous Q12H   sodium chloride flush  3 mL Intravenous Q12H    BMET    Component Value  Date/Time   NA 139 04/17/2021 0632   K 3.3 (L) 04/17/2021 0632   CL 102 04/17/2021 0632   CO2 23 04/17/2021 0632   GLUCOSE 105 (H) 04/17/2021 0632   BUN 40 (H) 04/17/2021 0632   CREATININE 2.70 (H) 04/17/2021 0632   CREATININE 5.00 (H) 04/12/2021 1640   CALCIUM 7.0 (L) 04/17/2021 0632   GFRNONAA 23 (L) 04/17/2021 0632   GFRNONAA 36 (L) 12/30/2019 1143   GFRAA 42 (L) 12/30/2019 1143   CBC    Component Value Date/Time   WBC 11.0 (H) 04/17/2021 0632   RBC 2.66 (L) 04/17/2021 0632   HGB 8.1 (L) 04/17/2021 0632   HCT 25.8 (L) 04/17/2021 0632   PLT 245 04/17/2021 0632   MCV 97.0 04/17/2021 0632   MCH 30.5 04/17/2021 0632   MCHC 31.4 04/17/2021 0632   RDW 15.9 (H) 04/17/2021 0632   LYMPHSABS 1.3 04/13/2021 1230   MONOABS 1.0 04/13/2021 1230   EOSABS 0.1 04/13/2021 1230   BASOSABS 0.1 04/13/2021 1230    Assessment/Plan:   Progressive CKD Stage V, now ESRD- presented with worsening renal function, acidosis, and uremia.  S/p HD 9/10 and 9/12 after TDC placement.  Will need outpatient HD to be arranged and will need AVF/AVG placement.  Will see if Dr. Donnetta Hutching can operate this week.  Will continue with HD on MWF schedule for now pending outpatient schedule.  Anemia - due to ESRD, cont with ESA and follow H/H. HTN/volume - stable Hypokalemia - will use added K bath CKD-MBD -  on calcitriol and phoslo.  Severe protein malnutrition - supplement Bilateral leg pain - unclear etiology.  Has pulses bilaterally, no ulcerations.  Will check uric acid level.  Doubt calciphylaxis given normal phosphorus and iPTH of 420. SVT/A fib with RVR per primary. Disposition - will benefit from PT/OT and get out of bed.  Awaiting outpatient dialysis schedule.   Donetta Potts, MD [employer] [phone]

## 2021-04-17 NOTE — Evaluation (Signed)
Physical Therapy Evaluation Patient Details Name: BOWIE DELIA Sr. MRN: [id number] DOB: May 25, 1941 Today's Date: 04/17/2021  History of Present Illness  Mr. Loewen is a 80 yo known to me with a prior tunneled catheter placed that had this removed with improved renal function who now needs another tunneled catheter placed. He had a femoral line placed over the weekend for dialysis. He presented to the hospital with worsening renal function and encephalopathy.  Nephrology referred him for tunneled catheter placement while he awaits fistula placement.   Clinical Impression  Patient functioning near baseline for functional mobility and gait demonstrating slow labored movement for getting into/out of bed, transferring to chair and raised toilet seat in bathroom without loss of balance, able to ambulate in hallway with steppage gait on right foot due to limited dorsiflexion without loss of balance and limited mostly due to fatigue.  Patient tolerated staying up in chair after therapy with his spouse present in room.  Patient will benefit from continued physical therapy in hospital and recommended venue below to increase strength, balance, endurance for safe ADLs and gait.          Recommendations for follow up therapy are one component of a multi-disciplinary discharge planning process, led by the attending physician.  Recommendations may be updated based on patient status, additional functional criteria and insurance authorization.  Follow Up Recommendations Home health PT;Supervision for mobility/OOB;Supervision - Intermittent    Equipment Recommendations  None recommended by PT    Recommendations for Other Services       Precautions / Restrictions Precautions Precautions: Fall Restrictions Weight Bearing Restrictions: No      Mobility  Bed Mobility Overal bed mobility: Needs Assistance Bed Mobility: Rolling;Sidelying to Sit;Sit to Sidelying Rolling: Min guard Sidelying to sit:  Min guard     Sit to sidelying: Min guard General bed mobility comments: slow labored movement requiring use of bed rail for rolling to side and verbal/tactile cueing    Transfers Overall transfer level: Needs assistance Equipment used: Rolling walker (2 wheeled) Transfers: Sit to/from [facility] Sit to Stand: Min assist Stand pivot transfers: Min guard       General transfer comment: has diffiuclty completing sit to stands from chair due to BLE weakness, good return from bedside and raised toilet seat  Ambulation/Gait Ambulation/Gait assistance: Min guard Gait Distance (Feet): 50 Feet Assistive device: Rolling walker (2 wheeled) Gait Pattern/deviations: Decreased step length - right;Decreased step length - left;Decreased stride length;Decreased dorsiflexion - right;Steppage Gait velocity: decreased   General Gait Details: slow labored cadence with steppage of RLE due to decreased ankle dorsiflexion without loss of balance, limited mostly due to c/o fatigue  Stairs            Wheelchair Mobility    Modified Rankin (Stroke Patients Only)       Balance Overall balance assessment: Needs assistance Sitting-balance support: Feet supported;No upper extremity supported Sitting balance-Leahy Scale: Good Sitting balance - Comments: seated at EOB   Standing balance support: During functional activity;Bilateral upper extremity supported Standing balance-Leahy Scale: Fair Standing balance comment: using RW                             Pertinent Vitals/Pain Pain Assessment: No/denies pain    Home Living Family/patient expects to be discharged to:: Private residence   Available Help at Discharge: Family;Available 24 hours/day Type of Home: House Home Access: Ramped entrance     Home Layout:  One level Home Equipment: [place] - 2 wheels;Cane - single point;Grab bars - tub/shower;Grab bars - toilet;Bedside commode;Shower seat - built in       Prior Function Level of Independence: Needs assistance   Gait / Transfers Assistance Needed: household ambulator using RW  ADL's / Homemaking Assistance Needed: assisted by family        Hand Dominance        Extremity/Trunk Assessment   Upper Extremity Assessment Upper Extremity Assessment: Generalized weakness    Lower Extremity Assessment Lower Extremity Assessment: Generalized weakness    Cervical / Trunk Assessment Cervical / Trunk Assessment: Kyphotic  Communication   Communication: HOH  Cognition Arousal/Alertness: Awake/alert Behavior During Therapy: WFL for tasks assessed/performed Overall Cognitive Status: Within Functional Limits for tasks assessed                                        General Comments      Exercises     Assessment/Plan    PT Assessment Patient needs continued PT services  PT Problem List Decreased strength;Decreased activity tolerance;Decreased balance;Decreased mobility       PT Treatment Interventions DME instruction;Gait training;Stair training;Functional mobility training;Therapeutic activities;Therapeutic exercise;Balance training;Patient/family education    PT Goals (Current goals can be found in the Care Plan section)  Acute Rehab PT Goals Patient Stated Goal: return home with family to assist PT Goal Formulation: With patient/family Time For Goal Achievement: 04/20/21 Potential to Achieve Goals: Good    Frequency Min 3X/week   Barriers to discharge        Co-evaluation               AM-PAC PT "6 Clicks" Mobility  Outcome Measure Help needed turning from your back to your side while in a flat bed without using bedrails?: A Little Help needed moving from lying on your back to sitting on the side of a flat bed without using bedrails?: A Little Help needed moving to and from a bed to a chair (including a wheelchair)?: A Little Help needed standing up from a chair using your arms (e.g.,  wheelchair or bedside chair)?: A Little Help needed to walk in hospital room?: A Little Help needed climbing 3-5 steps with a railing? : A Lot 6 Click Score: 17    End of Session   Activity Tolerance: Patient tolerated treatment well;Patient limited by fatigue Patient left: in chair;with call bell/phone within reach;with family/visitor present Nurse Communication: Mobility status PT Visit Diagnosis: Unsteadiness on feet (R26.81);Other abnormalities of gait and mobility (R26.89);Muscle weakness (generalized) (M62.81)    Time: 9767-3419 PT Time Calculation (min) (ACUTE ONLY): 29 min   Charges:   PT Evaluation $PT Eval Moderate Complexity: 1 Mod PT Treatments $Therapeutic Activity: 23-37 mins        3:13 PM, 04/17/21 Lonell Grandchild, MPT Physical Therapist with [facility] [phone] office [phone] mobile phone

## 2021-04-17 NOTE — Plan of Care (Signed)
  Problem: Acute Rehab PT Goals(only PT should resolve) Goal: Pt Will Go Supine/Side To Sit Outcome: Progressing Flowsheets (Taken 04/17/2021 1514) Pt will go Supine/Side to Sit: with modified independence Goal: Patient Will Transfer Sit To/From Stand Outcome: Progressing Flowsheets (Taken 04/17/2021 1514) Patient will transfer sit to/from stand:  with supervision  with modified independence Goal: Pt Will Transfer Bed To Chair/Chair To Bed Outcome: Progressing Flowsheets (Taken 04/17/2021 1514) Pt will Transfer Bed to Chair/Chair to Bed:  with supervision  with modified independence Goal: Pt Will Ambulate Outcome: Progressing Flowsheets (Taken 04/17/2021 1514) Pt will Ambulate:  75 feet  with supervision  with modified independence  with rolling walker   3:14 PM, 04/17/21 [name], MPT Physical Therapist with [facility] [phone] office [phone] mobile phone

## 2021-04-17 NOTE — Progress Notes (Signed)
Patient suffers from acute metabolic encephalopathy, End stage renal disease, New dialysis patient which impairs their ability to perform daily activities like ambulating in the home. A walker will not resolve issue with performing activities of daily living. A wheelchair  will allow patient to safely perform daily activities. Patient can safely propel the wheelchair in the home or has a caregiver who can provide assistance

## 2021-04-17 NOTE — TOC Progression Note (Addendum)
Transition of Care (TOC) - Progression Note    Patient Details  Name: Mark CRESS Sr. MRN: [id number] Date of Birth: 10-24-1940  Transition of Care [facility]) CM/SW Contact  Boneta Lucks, RN Phone Number: 04/17/2021, 3:41 PM  Clinical Narrative:   PT is recommending home health. Wife is agreeable, they have used [facility] in the past.  Vaughan Basta with [facility] accepted, TOC asked MD for HHPT/RN orders.   [facility], accepted patient for  MWF, chair time is 12PM, he will need to arrive at 11:45 on Friday. Updated his wife, added to AVS.  Expected Discharge Plan: [place] Barriers to Discharge: Continued Medical Work up  Expected Discharge Plan and Services Expected Discharge Plan: [place] arrangements for the past 2 months: Single Family Home                 DME Agency: [employer] Date DME Agency Contacted: 04/16/21 Time DME Agency Contacted: [phone] Representative spoke with at DME Agency: Caryl Pina HH Arranged: RN, PT [facility] Agency: [place] ([place]) Date [place]: 04/17/21 Time [place]: [place] Representative spoke with at [place]: Romualdo Bolk  Readmission Risk Interventions Readmission Risk Prevention Plan 04/16/2021 12/04/2018  Transportation Screening Complete Complete  PCP or Specialist Appt within 3-5 Days - (No Data)  [place] or [place] - Complete  Social Work Consult for [place] Planning/Counseling - Complete  Palliative Care Screening - Not Applicable  Medication Review Press photographer) Complete Complete  HRI or Home Care Consult Complete -  SW Recovery Care/Counseling Consult Complete -  Palliative Care Screening Not Applicable -  [place] Not Complete -  Some recent data might be hidden

## 2021-04-17 NOTE — Progress Notes (Signed)
Patient Demographics:    [name], is a 80 y.o. male, DOB - 10/15/1940, TDV:[id number]  Admit date - 04/13/2021   Admitting Physician Magie Ciampa [place] Brick, MD  Outpatient Primary MD for the patient is [name], MD  LOS - 4   Chief Complaint  Patient presents with   Weakness    Tired for a couple of months        Subjective:    [name] today has no fevers, no emesis,  No chest pain,   --more awake, more coherent -wife at bedside -Oral intake is fair  Assessment  & Plan :    Principal Problem:   Acute on chronic renal failure ([place]) Active Problems:   AKI (acute kidney injury) ([place])   ESRD (end stage renal disease) ([place])   E. Faecalis and Staph Aureus UTI   Uncontrolled type 2 diabetes mellitus with hyperglycemia (HCC)   Acute metabolic encephalopathy   HOH (hard of hearing)  Brief Summary:- -80 y.o. male  with a history of type 2 diabetes on insulin, hypertension, ESRD requiring hemodialysis from 12/29/2020 through early part of July 0626,  nonalcoholic fatty liver disease, CAD, grade 1 diastolic dysfunction and hypertension -Admitted with acute metabolic encephalopathy, generalized weakness and deconditioning with ambulatory dysfunction -Anticipate discharge home 04/18/2021 after hemodialysis session if outpatient hemodialysis chair has been arranged  A/p 1)CKD5- progressed with new ESRD -patient was treated with hemodialysis from 12/29/2020 through early part of July 2022 -He was taken off hemodialysis due to renal recovery -Patient had hemodialysis on 04/14/2021 due to uremia/encephalopathy -Renal function appears to be worsening again with worsening anion gap metabolic acidosis --Renal ultrasound suggest possible cystitis, but no hydronephrosis-UA is not suggestive of UTI -Patient is voiding well Nephrology consult from nephrologist Dr. Lawson Radar appreciated -Patient  Re-initiated on hemodialysis on 04/14/2021-anticipate lifelong hemodialysis -Continue bicarb replacements -s/p PermCath placement on 04/16/2021   2)Acute metabolic encephalopathy due to uremia-worse overall --confusion and disorientation from time to time most likely due to #1 above. -Mental status improved with hemodialysis sessions   Hypocalcemia- -When corrected for albumin calcium is actually around 8 -Continue calcitriol and cholecalciferol.       Anemia in CKD-  -ESA/Epogen per nephrology team -Follow hemoglobin trend and further transfuse as clinically indicated.    Uncontrolled type 2 diabetes mellitus with renal complications R4W is 6.2 reflecting excellent Diabetic control PTA -Continue insulin regimen Use Novolog/Humalog Sliding scale insulin with Accu-Cheks/Fingersticks as ordered  -  SVT/A. fib with RVR-hold Cardizem and  hold Lopressor due to soft BP   Coronary artery disease -no chest pain symptoms at this time.   -Continue aspirin,  -Hold metoprolol due to soft BP  Penile and left lower extremity lesions suggestive of calciphylaxis--- get nephrology input  E. Faecalis and Staph Aureus UTI--- pharmacy consult for vancomycin dosing and then HD patient -Patient will require another dose of IV vancomycin after hemodialysis session on 04/18/2021  -Generalized weakness and deconditioning--- physical therapy eval appreciated recommends home health PT   Disposition/Need for in-Hospital Stay- patient unable to be discharged at this time due to s/p  PermCath placement and clipping process for outpatient hemodialysis prior to discharge  status is: Inpatient   Remains inpatient appropriate because: Disposition above  Dispo: The patient is from: Home              Anticipated d/c is to: Home with [facility]              Anticipated d/c date is: 1 day              Patient currently is not medically stable to d/c. Barriers: Not Clinically Stable- -Anticipate discharge home 04/18/2021  after hemodialysis session if outpatient hemodialysis chair has been arranged   Family Communication:   Discussed with wife at bedside  Consults  :  Nephrology  DVT Prophylaxis  :   - SCDs  heparin injection 5,000 Units Start: 04/13/21 2200 SCDs Start: 04/13/21 1946 Place TED hose Start: 04/13/21 1946    Lab Results  Component Value Date   PLT 245 04/17/2021    Inpatient Medications  Scheduled Meds:  aspirin EC  81 mg Oral Q breakfast   calcitRIOL  0.25 mcg Oral Daily   calcium acetate  1,334 mg Oral TID WC   Chlorhexidine Gluconate Cloth  6 each Topical Q0600   heparin  5,000 Units Subcutaneous Q8H   insulin glargine-yfgn  6 Units Subcutaneous Daily   lipase/protease/amylase  12,000 Units Oral TID with meals   multivitamin with minerals  1 tablet Oral Daily   mupirocin ointment  1 application Nasal BID   sodium chloride flush  3 mL Intravenous Q12H   sodium chloride flush  3 mL Intravenous Q12H   Continuous Infusions:  sodium chloride     sodium chloride     sodium chloride     sodium chloride     sodium chloride     ferric gluconate (FERRLECIT) IVPB 125 mg (04/16/21 1700)   lactated ringers     [START ON 04/18/2021] vancomycin     PRN Meds:.sodium chloride, sodium chloride, sodium chloride, sodium chloride, sodium chloride, acetaminophen **OR** acetaminophen, albuterol, alteplase, alteplase, bisacodyl, heparin, heparin, ondansetron **OR** ondansetron (ZOFRAN) IV, polyethylene glycol, sodium chloride flush, traZODone    Anti-infectives (From admission, onward)    Start     Dose/Rate Route Frequency Ordered Stop   04/18/21 1600  vancomycin (VANCOCIN) IVPB 1000 mg/200 mL premix        1,000 mg 200 mL/hr over 60 Minutes Intravenous Every M-W-F (Hemodialysis) 04/17/21 1246     04/16/21 1800  vancomycin (VANCOCIN) IVPB 1000 mg/200 mL premix        1,000 mg 200 mL/hr over 60 Minutes Intravenous  Once 04/16/21 1744 04/16/21 2234   04/16/21 1230  ceFAZolin (ANCEF) IVPB  2g/100 mL premix        2 g 200 mL/hr over 30 Minutes Intravenous On call to O.R. 04/16/21 1224 04/17/21 0559   04/16/21 0915  vancomycin (VANCOREADY) IVPB 1500 mg/300 mL        1,500 mg 150 mL/hr over 120 Minutes Intravenous  Once 04/16/21 0816 04/16/21 1227   04/13/21 1300  cefTRIAXone (ROCEPHIN) 2 g in sodium chloride 0.9 % 100 mL IVPB  Status:  Discontinued        2 g 200 mL/hr over 30 Minutes Intravenous Every 24 hours 04/13/21 1257 04/16/21 0816         Objective:   Vitals:   04/16/21 1815 04/16/21 2050 04/17/21 0501 04/17/21 0600  BP: 138/80 (!) 144/80 (!) 158/83   Pulse: (!) 105 98 96   Resp: 20 (!) 21 20   Temp: 98 F (36.7 C) 99.3 F (37.4 C) 98.6 F (37 C)  TempSrc: Oral Oral Oral   SpO2: 100% 99% 98%   Weight:    85.6 kg  Height:        Wt Readings from Last 3 Encounters:  04/17/21 85.6 kg  04/12/21 80.7 kg  04/06/21 80.7 kg     Intake/Output Summary (Last 24 hours) at 04/17/2021 1524 Last data filed at 04/17/2021 1300 Gross per 24 hour  Intake 120 ml  Output 403 ml  Net -283 ml   Physical Exam  Gen:-Resting, in no acute distress  HEENT:- [place].AT, No sclera icterus Ears-- HOH Neck-Supple Neck, IJ PermCath Lungs-no significant rales or wheezes at this time fair symmetrical air movement CV- S1, S2 normal, regular  Abd-  +ve B.Sounds, Abd Soft, No tenderness,    Extremity/Skin:- No  edema, pedal pulses present  Psych-affect is appropriate, alert and oriented x3  neuro-generalized weakness, no new focal deficits, no tremors -   Data Review:   Micro Results Recent Results (from the past 240 hour(s))  Urine Culture     Status: None   Collection Time: 04/10/21 11:38 AM   Specimen: Urine  Result Value Ref Range Status   MICRO NUMBER: [id number]  Final   SPECIMEN QUALITY: Adequate  Final   Sample Source NOT GIVEN  Final   STATUS: FINAL  Final   ISOLATE 1:   Final    Less than 10,000 CFU/mL of single Gram positive organism isolated. No further  testing will be performed. If clinically indicated, recollection using a method to minimize contamination, with prompt transfer to Urine Culture Transport Tube, is recommended.   COMMENT:   Final    Additional non-predominating organism(s) isolated. These organisms, commonly found on external and internal genitalia, are considered colonizers. No further testing performed.  MICROSCOPIC MESSAGE     Status: None   Collection Time: 04/10/21 11:38 AM  Result Value Ref Range Status   Note   Final    Comment: This urine was analyzed for the presence of WBC,  RBC, bacteria, casts, and other formed elements.  Only those elements seen were reported. . .   Blood culture (routine single)     Status: None (Preliminary result)   Collection Time: 04/13/21  1:01 PM   Specimen: BLOOD RIGHT ARM  Result Value Ref Range Status   Specimen Description BLOOD RIGHT ARM  Final   Special Requests   Final    Blood Culture adequate volume BOTTLES DRAWN AEROBIC AND ANAEROBIC   Culture   Final    NO GROWTH 4 DAYS Performed at [facility], [address], [place], [place] [address]    Report Status PENDING  Incomplete  Resp Panel by RT-PCR (Flu A&B, Covid) Nasopharyngeal Swab     Status: None   Collection Time: 04/13/21  1:15 PM   Specimen: Nasopharyngeal Swab; Nasopharyngeal(NP) swabs in vial transport medium  Result Value Ref Range Status   SARS Coronavirus 2 by RT PCR NEGATIVE NEGATIVE Final    Comment: (NOTE) SARS-CoV-2 target nucleic acids are NOT DETECTED.  The SARS-CoV-2 RNA is generally detectable in upper respiratory specimens during the acute phase of infection. The lowest concentration of SARS-CoV-2 viral copies this assay can detect is 138 copies/mL. A negative result does not preclude SARS-Cov-2 infection and should not be used as the sole basis for treatment or other patient management decisions. A negative result may occur with  improper specimen collection/handling, submission of specimen  other than nasopharyngeal swab, presence of viral mutation(s) within the areas targeted by this assay,  and inadequate number of viral copies(<138 copies/mL). A negative result must be combined with clinical observations, patient history, and epidemiological information. The expected result is Negative.  Fact Sheet for Patients:  EntrepreneurPulse.com.au  Fact Sheet for Healthcare Providers:  IncredibleEmployment.be  This test is no t yet approved or cleared by the [place] FDA and  has been authorized for detection and/or diagnosis of SARS-CoV-2 by FDA under an Emergency Use Authorization (EUA). This EUA will remain  in effect (meaning this test can be used) for the duration of the COVID-19 declaration under Section 564(b)(1) of the Act, 21 U.S.C.section 360bbb-3(b)(1), unless the authorization is terminated  or revoked sooner.       Influenza A by PCR NEGATIVE NEGATIVE Final   Influenza B by PCR NEGATIVE NEGATIVE Final    Comment: (NOTE) The Xpert Xpress SARS-CoV-2/FLU/RSV plus assay is intended as an aid in the diagnosis of influenza from Nasopharyngeal swab specimens and should not be used as a sole basis for treatment. Nasal washings and aspirates are unacceptable for Xpert Xpress SARS-CoV-2/FLU/RSV testing.  Fact Sheet for Patients: EntrepreneurPulse.com.au  Fact Sheet for Healthcare Providers: IncredibleEmployment.be  This test is not yet approved or cleared by the [place] FDA and has been authorized for detection and/or diagnosis of SARS-CoV-2 by FDA under an Emergency Use Authorization (EUA). This EUA will remain in effect (meaning this test can be used) for the duration of the COVID-19 declaration under Section 564(b)(1) of the Act, 21 U.S.C. section 360bbb-3(b)(1), unless the authorization is terminated or revoked.  Performed at [facility], [address], [place], [place]  [address]   Urine Culture     Status: Abnormal (Preliminary result)   Collection Time: 04/13/21  3:01 PM   Specimen: In/Out Cath Urine  Result Value Ref Range Status   Specimen Description   Final    IN/OUT CATH URINE Performed at [facility], [address] [place], [place] [address]    Special Requests   Final    NONE Performed at [facility], [address], [place], [place] [address]    Culture (A)  Final    40,000 COLONIES/mL STAPHYLOCOCCUS AUREUS 10,000 COLONIES/mL ENTEROCOCCUS FAECALIS SUSCEPTIBILITIES TO FOLLOW Performed at [facility], [place] [address], [place], [place] [address]    Report Status PENDING  Incomplete  Surgical PCR screen     Status: Abnormal   Collection Time: 04/16/21  2:20 AM   Specimen: Nasal Mucosa; Nasal Swab  Result Value Ref Range Status   MRSA, PCR NEGATIVE NEGATIVE Final   Staphylococcus aureus POSITIVE (A) NEGATIVE Final    Comment: (NOTE) The Xpert SA Assay (FDA approved for NASAL specimens in patients 25 years of age and older), is one component of a comprehensive surveillance program. It is not intended to diagnose infection nor to guide or monitor treatment. Performed at [facility], [address] [place], [place] [address]     Radiology Reports US RENAL  Result Date: 04/13/2021 CLINICAL DATA:  Acute renal injury.  Hypertension. EXAM: RENAL / URINARY TRACT ULTRASOUND COMPLETE COMPARISON:  Ultrasound renal 12/24/2020. CT abdomen and pelvis 03/20/2013. FINDINGS: Right Kidney: Renal measurements: 12.2 x 7.0 x 7.1 cm = volume: 316 mL. There is no hydronephrosis. Echogenicity is within normal limits. No focal lesion identified. Left Kidney: Renal measurements: 12.9 x 6.2 x 7.0 cm = volume: 292 mL. There is no hydronephrosis. Echogenicity is within normal limits. No focal lesion identified. Bladder: The bilateral ureteral jets are not seen. Bladder wall appears  diffusely thickened. There is some layering echogenic material in the dependent  portion of the bladder. Other: None. IMPRESSION: 1. No hydronephrosis. 2. Diffuse bladder wall thickening. Correlate for cystitis. There is layering echogenic material in the bladder which may indicate infection or hemorrhage. Electronically Signed   By: Ronney Asters M.D.   On: 04/13/2021 15:42   DG Chest Port 1 View  Result Date: 04/16/2021 CLINICAL DATA:  Dialysis catheter placement EXAM: PORTABLE CHEST 1 VIEW COMPARISON:  04/13/2021 FINDINGS: Interval placement of right chest large-bore multi lumen vascular catheter, tip projecting over the right atrium. Unchanged elevation of the right hemidiaphragm, possibly with a small associated right pleural effusion. IMPRESSION: Interval placement of right chest large-bore multi lumen vascular catheter, tip projecting over the right atrium. No pneumothorax. Electronically Signed   By: Eddie Candle M.D.   On: 04/16/2021 14:40   DG Chest Port 1 View  Result Date: 04/13/2021 CLINICAL DATA:  Weakness. EXAM: PORTABLE CHEST 1 VIEW COMPARISON:  02/02/2021 FINDINGS: Right jugular dialysis catheter has been removed in the interim. Again noted is elevation of the right hemidiaphragm. Both lungs are clear. Heart and mediastinum are stable. Atherosclerotic calcifications at the aortic arch. Trachea is midline. Surgical clips in the right upper abdomen. IMPRESSION: 1. No acute abnormality. 2. Chronic elevation of the right hemidiaphragm. Electronically Signed   By: Markus Daft M.D.   On: 04/13/2021 13:24   DG C-Arm 1-60 Min-No Report  Result Date: 04/16/2021 Fluoroscopy was utilized by the requesting physician.  No radiographic interpretation.     CBC Recent Labs  Lab 04/12/21 1350 04/12/21 1640 04/13/21 1230 04/14/21 0634 04/17/21 0632  WBC  --  11.5* 13.1* 15.7* 11.0*  HGB 8.9* 9.0* 8.8* 8.0* 8.1*  HCT  --  29.2* 28.5* 26.9* 25.8*  PLT  --  231 247 212 245  MCV  --  97.7 101.1* 101.9* 97.0  MCH  --  30.1 31.2 30.3 30.5  MCHC  --  30.8* 30.9 29.7* 31.4  RDW   --  14.7 15.9* 15.9* 15.9*  LYMPHSABS  --  1,392 1.3  --   --   MONOABS  --   --  1.0  --   --   EOSABS  --  115 0.1  --   --   BASOSABS  --  35 0.1  --   --     Chemistries  Recent Labs  Lab 04/12/21 1640 04/13/21 1230 04/14/21 0634 04/15/21 0551 04/16/21 0527 04/17/21 0632  NA 140 139 142  144 139 138 139  K 4.3 4.1 4.0  4.1 2.9* 3.0* 3.3*  CL 116* 117* 118*  119* 110 108 102  CO2 <10* 8* 7*  8* 19* 17* 23  GLUCOSE 274* 160* 47*  47* 88 125* 105*  BUN 96* 112* 102*  112* 62* 72* 40*  CREATININE 5.00* 5.25* 5.20*  5.30* 3.46* 4.24* 2.70*  CALCIUM 7.1* 7.0* 7.0*  7.1* 6.2* 6.3* 7.0*  AST 13 14*  --   --   --   --   ALT 16 19  --   --   --   --   ALKPHOS  --  134*  --   --   --   --   BILITOT 0.3 0.2*  --   --   --   --    ------------------------------------------------------------------------------------------------------------------ No results for input(s): CHOL, HDL, LDLCALC, TRIG, CHOLHDL, LDLDIRECT in the last 72 hours.  Lab Results  Component Value Date  HGBA1C 6.2 (H) 03/19/2021   ------------------------------------------------------------------------------------------------------------------ No results for input(s): TSH, T4TOTAL, T3FREE, THYROIDAB in the last 72 hours.  Invalid input(s): FREET3 ------------------------------------------------------------------------------------------------------------------ Recent Labs    04/15/21 0551  FERRITIN 117  TIBC 148*  IRON 11*    Coagulation profile Recent Labs  Lab 04/13/21 1230  INR 1.5*    No results for input(s): DDIMER in the last 72 hours.  Cardiac Enzymes No results for input(s): CKMB, TROPONINI, MYOGLOBIN in the last 168 hours.  Invalid input(s): CK ------------------------------------------------------------------------------------------------------------------    Component Value Date/Time   BNP 700.0 (H) 04/13/2021 1230   BNP 202 (H) 04/01/2019 1223     Roxan Hockey M.D on  04/17/2021 at 3:24 PM  Go to [email or web address] - for contact info  [facility] - Office  [phone]

## 2021-04-18 DIAGNOSIS — N186 End stage renal disease: Secondary | ICD-10-CM

## 2021-04-18 DIAGNOSIS — Z992 Dependence on renal dialysis: Secondary | ICD-10-CM

## 2021-04-18 DIAGNOSIS — H833X1 Noise effects on right inner ear: Secondary | ICD-10-CM

## 2021-04-18 DIAGNOSIS — N179 Acute kidney failure, unspecified: Principal | ICD-10-CM

## 2021-04-18 DIAGNOSIS — N390 Urinary tract infection, site not specified: Secondary | ICD-10-CM

## 2021-04-18 DIAGNOSIS — E1165 Type 2 diabetes mellitus with hyperglycemia: Secondary | ICD-10-CM

## 2021-04-18 DIAGNOSIS — N189 Chronic kidney disease, unspecified: Secondary | ICD-10-CM

## 2021-04-18 DIAGNOSIS — G9341 Metabolic encephalopathy: Secondary | ICD-10-CM

## 2021-04-18 LAB — COMPREHENSIVE METABOLIC PANEL
ALT: 9 U/L (ref 0–44)
AST: 16 U/L (ref 15–41)
Albumin: 2 g/dL — ABNORMAL LOW (ref 3.5–5.0)
Alkaline Phosphatase: 143 U/L — ABNORMAL HIGH (ref 38–126)
Anion gap: 13 (ref 5–15)
BUN: 55 mg/dL — ABNORMAL HIGH (ref 8–23)
CO2: 24 mmol/L (ref 22–32)
Calcium: 7.1 mg/dL — ABNORMAL LOW (ref 8.9–10.3)
Chloride: 104 mmol/L (ref 98–111)
Creatinine, Ser: 3.44 mg/dL — ABNORMAL HIGH (ref 0.61–1.24)
GFR, Estimated: 17 mL/min — ABNORMAL LOW (ref >60)
Glucose, Bld: 131 mg/dL — ABNORMAL HIGH (ref 70–99)
Potassium: 3.6 mmol/L (ref 3.5–5.1)
Sodium: 141 mmol/L (ref 135–145)
Total Bilirubin: 0.3 mg/dL (ref 0.3–1.2)
Total Protein: 5.6 g/dL — ABNORMAL LOW (ref 6.5–8.1)

## 2021-04-18 LAB — RENAL FUNCTION PANEL
Albumin: 2 g/dL — ABNORMAL LOW (ref 3.5–5.0)
Anion gap: 12 (ref 5–15)
BUN: 55 mg/dL — ABNORMAL HIGH (ref 8–23)
CO2: 25 mmol/L (ref 22–32)
Calcium: 7.3 mg/dL — ABNORMAL LOW (ref 8.9–10.3)
Chloride: 106 mmol/L (ref 98–111)
Creatinine, Ser: 3.37 mg/dL — ABNORMAL HIGH (ref 0.61–1.24)
GFR, Estimated: 18 mL/min — ABNORMAL LOW (ref >60)
Glucose, Bld: 128 mg/dL — ABNORMAL HIGH (ref 70–99)
Phosphorus: 6 mg/dL — ABNORMAL HIGH (ref 2.5–4.6)
Potassium: 3.8 mmol/L (ref 3.5–5.1)
Sodium: 143 mmol/L (ref 135–145)

## 2021-04-18 LAB — BPAM RBC
Blood Product Expiration Date: 202210132359
Unit Type and Rh: 5100

## 2021-04-18 LAB — CBC
HCT: 25.8 % — ABNORMAL LOW (ref 39.0–52.0)
Hemoglobin: 8 g/dL — ABNORMAL LOW (ref 13.0–17.0)
MCH: 30.9 pg (ref 26.0–34.0)
MCHC: 31 g/dL (ref 30.0–36.0)
MCV: 99.6 fL (ref 80.0–100.0)
Platelets: 209 10*3/uL (ref 150–400)
RBC: 2.59 MIL/uL — ABNORMAL LOW (ref 4.22–5.81)
RDW: 15.8 % — ABNORMAL HIGH (ref 11.5–15.5)
WBC: 9.4 10*3/uL (ref 4.0–10.5)
nRBC: 0 % (ref 0.0–0.2)

## 2021-04-18 LAB — TYPE AND SCREEN
ABO/RH(D): O POS
Antibody Screen: NEGATIVE
Unit division: 0

## 2021-04-18 LAB — CULTURE, BLOOD (SINGLE)
Culture: NO GROWTH
Special Requests: ADEQUATE

## 2021-04-18 LAB — GLUCOSE, CAPILLARY
Glucose-Capillary: 121 mg/dL — ABNORMAL HIGH (ref 70–99)
Glucose-Capillary: 235 mg/dL — ABNORMAL HIGH (ref 70–99)

## 2021-04-18 MED ORDER — CALCIUM ACETATE (PHOS BINDER) 667 MG PO CAPS: 1334.0000 mg | ORAL_CAPSULE | Freq: Three times a day (TID) | ORAL | 0 refills | Status: DC

## 2021-04-18 MED ORDER — HEPARIN SODIUM (PORCINE) 1000 UNIT/ML IJ SOLN
INTRAMUSCULAR | Status: AC
  Administered 2021-04-18: 3800 [IU] via INTRAVENOUS_CENTRAL
  Filled 2021-04-18: qty 5

## 2021-04-18 MED ORDER — CLINDAMYCIN HCL 300 MG PO CAPS: 300.0000 mg | ORAL_CAPSULE | Freq: Three times a day (TID) | ORAL | 0 refills | Status: AC

## 2021-04-18 MED ORDER — PANCRELIPASE (LIP-PROT-AMYL) 12000-38000 UNITS PO CPEP: 12000.0000 [IU] | ORAL_CAPSULE | Freq: Three times a day (TID) | ORAL | 3 refills | Status: DC

## 2021-04-18 MED ORDER — MUPIROCIN 2 % EX OINT: 1.0000 "application " | TOPICAL_OINTMENT | Freq: Two times a day (BID) | CUTANEOUS | 0 refills | Status: DC

## 2021-04-18 NOTE — Plan of Care (Signed)
  Problem: Acute Rehab OT Goals (only OT should resolve) Goal: Pt. Will Perform Grooming Flowsheets (Taken 04/18/2021 1009) Pt Will Perform Grooming:  with modified independence  standing Goal: Pt. Will Perform Lower Body Dressing Flowsheets (Taken 04/18/2021 1009) Pt Will Perform Lower Body Dressing:  with modified independence  sitting/lateral leans  with adaptive equipment Goal: Pt. Will Transfer To Toilet Flowsheets (Taken 04/18/2021 1009) Pt Will Transfer to Toilet:  with modified independence  stand pivot transfer  ambulating Goal: Pt/Caregiver Will Perform Home Exercise Program Flowsheets (Taken 04/18/2021 1009) Pt/caregiver will Perform Home Exercise Program:  Increased strength  Both right and left upper extremity  Independently  Rondel Episcopo OT, MOT

## 2021-04-18 NOTE — Progress Notes (Signed)
[facility] NEPHROLOGY PROGRESS NOTE  Assessment/ Plan: Pt is a 80 y.o. yo male with history of hypertension, nonalcoholic fatty liver, CAD, CHF, COVID infection with prolonged hospitalization required HD for AKI, briefly off of dialysis now progressed to ESRD.  #Progressive CKD5 to new ESRD: Presented with worsening renal function, acidosis and uremia.  Status post 2 dialysis HD and tolerated well.  Plan for third treatment today.  He has tunneled HD catheter.  Plan for permanent access as outpatient.  The outpatient HD arranged for MWF schedule starting from this Friday at [facility] kidney center, chair time 12 PM.   #Anion gap metabolic acidosis: Now managed with dialysis.   #Hypertension/volume: Volume status acceptable.  UF at HD.  #Anemia of CKD/chronic illness: Continue ESA and monitor hemoglobin.  #Secondary hyperparathyroidism: Continue calcitriol and phoslo.  Monitor calcium phosphorus level.  Okay to discharge from renal perspective.  Discussed with primary team.  Subjective: Seen and examined at bedside.  No new event.  Denies nausea vomiting chest pain shortness of breath. Objective Vital signs in last 24 hours: Vitals:   04/17/21 0600 04/17/21 2020 04/18/21 0425 04/18/21 0500  BP:  (!) 138/93 (!) 163/75   Pulse:  86 93   Resp:  19 19   Temp:  99.2 F (37.3 C) 98.3 F (36.8 C)   TempSrc:  Oral    SpO2:  97% 98%   Weight: 85.6 kg   86.2 kg  Height:       Weight change: 0.6 kg  Intake/Output Summary (Last 24 hours) at 04/18/2021 1011 Last data filed at 04/18/2021 1009 Gross per 24 hour  Intake 353 ml  Output 754 ml  Net -401 ml       Labs: Basic Metabolic Panel: Recent Labs  Lab 04/16/21 0527 04/17/21 0632 04/18/21 0558  NA 138 139 141  143  K 3.0* 3.3* 3.6  3.8  CL 108 102 104  106  CO2 17* 23 24  25   GLUCOSE 125* 105* 131*  128*  BUN 72* 40* 55*  55*  CREATININE 4.24* 2.70* 3.44*  3.37*  CALCIUM 6.3* 7.0* 7.1*  7.3*  PHOS  6.8* 4.6 6.0*   Liver Function Tests: Recent Labs  Lab 04/12/21 1640 04/13/21 1230 04/14/21 0634 04/16/21 0527 04/17/21 0632 04/18/21 0558  AST 13 14*  --   --   --  16  ALT 16 19  --   --   --  9  ALKPHOS  --  134*  --   --   --  143*  BILITOT 0.3 0.2*  --   --   --  0.3  PROT 6.2 6.7  --   --   --  5.6*  ALBUMIN  --  2.6*   < > 2.0* 1.9* 2.0*  2.0*   < > = values in this interval not displayed.   No results for input(s): LIPASE, AMYLASE in the last 168 hours. No results for input(s): AMMONIA in the last 168 hours. CBC: Recent Labs  Lab 04/12/21 1640 04/13/21 1230 04/14/21 0634 04/17/21 0632 04/18/21 0558  WBC 11.5* 13.1* 15.7* 11.0* 9.4  NEUTROABS 8,924* 10.5*  --   --   --   HGB 9.0* 8.8* 8.0* 8.1* 8.0*  HCT 29.2* 28.5* 26.9* 25.8* 25.8*  MCV 97.7 101.1* 101.9* 97.0 99.6  PLT 231 247 212 245 209   Cardiac Enzymes: No results for input(s): CKTOTAL, CKMB, CKMBINDEX, TROPONINI in the last 168 hours. CBG: Recent Labs  Lab  04/17/21 0621 04/17/21 1156 04/17/21 1651 04/17/21 2358 04/18/21 0559  GLUCAP 97 181* 179* 221* 121*    Iron Studies: No results for input(s): IRON, TIBC, TRANSFERRIN, FERRITIN in the last 72 hours. Studies/Results: DG Chest Port 1 View  Result Date: 04/16/2021 CLINICAL DATA:  Dialysis catheter placement EXAM: PORTABLE CHEST 1 VIEW COMPARISON:  04/13/2021 FINDINGS: Interval placement of right chest large-bore multi lumen vascular catheter, tip projecting over the right atrium. Unchanged elevation of the right hemidiaphragm, possibly with a small associated right pleural effusion. IMPRESSION: Interval placement of right chest large-bore multi lumen vascular catheter, tip projecting over the right atrium. No pneumothorax. Electronically Signed   By: Eddie Candle M.D.   On: 04/16/2021 14:40   DG C-Arm 1-60 Min-No Report  Result Date: 04/16/2021 Fluoroscopy was utilized by the requesting physician.  No radiographic interpretation.     Medications: Infusions:  sodium chloride     sodium chloride     sodium chloride     sodium chloride     sodium chloride     ferric gluconate (FERRLECIT) IVPB 125 mg (04/16/21 1700)   lactated ringers     vancomycin      Scheduled Medications:  aspirin EC  81 mg Oral Q breakfast   calcitRIOL  0.25 mcg Oral Daily   calcium acetate  1,334 mg Oral TID WC   Chlorhexidine Gluconate Cloth  6 each Topical Q0600   heparin  5,000 Units Subcutaneous Q8H   insulin glargine-yfgn  6 Units Subcutaneous Daily   lipase/protease/amylase  12,000 Units Oral TID with meals   multivitamin with minerals  1 tablet Oral Daily   mupirocin ointment  1 application Nasal BID   sodium chloride flush  3 mL Intravenous Q12H   sodium chloride flush  3 mL Intravenous Q12H    have reviewed scheduled and prn medications.  Physical Exam: General:NAD, comfortable Heart:RRR, s1s2 nl Lungs:clear b/l, no crackle Abdomen:soft, Non-tender, non-distended Extremities:No edema Dialysis Access: Right IJ TDC placed by Dr. Constance Haw on 9/12.  Site looks clean  Nelton A[place] [employer] 04/18/2021,10:11 AM  LOS: 5 days

## 2021-04-18 NOTE — Discharge Summary (Signed)
Physician Discharge Summary [facility]    Patient: Mark MARAJ Sr.                   Admit date: 04/13/2021   DOB: [birth year]             Discharge date:04/18/2021/11:36 AM EUM:[id number]                          PCP: [name], MD  Disposition: HOME / SNF / HOME with Home Health   Recommendations for Outpatient Follow-up:   Follow up: Nephrologist in 2-3 days  Continue hemodialysis as scheduled Monday Wednesdays and Fridays  Discharge Condition: Stable   Code Status:   Code Status: Full Code  Diet recommendation: Renal diet   Discharge Diagnoses:    Principal Problem:   Acute on chronic renal failure ([place]) Active Problems:   Uncontrolled type 2 diabetes mellitus with hyperglycemia ([place])   HOH (hard of hearing)   AKI (acute kidney injury) ([place])   Acute metabolic encephalopathy   ESRD (end stage renal disease) ([place])   E. Faecalis and Staph Aureus UTI   History of Present Illness/ Hospital Course Kathleen Argue Summary:    Brief Summary:- -80 y.o. male  with a history of type 2 diabetes on insulin, hypertension, ESRD requiring hemodialysis from 12/29/2020 through early part of July 5400,  nonalcoholic fatty liver disease, CAD, grade 1 diastolic dysfunction and hypertension -Admitted with acute metabolic encephalopathy, generalized weakness and deconditioning with ambulatory dysfunction -Anticipate discharge home 04/18/2021 after hemodialysis session     A/p 1)CKD5- progressed with new ESRD -patient was treated with hemodialysis from 12/29/2020 through early part of July 2022 -He was taken off hemodialysis due to renal recovery -Patient had hemodialysis on 04/14/2021 due to uremia/encephalopathy -Renal function appears to be worsening again with worsening anion gap metabolic acidosis --Renal ultrasound suggest possible cystitis, but no hydronephrosis-UA is not suggestive of UTI -Patient is voiding well Nephrology consult from nephrologist Dr. Lawson Radar appreciated -Patient Re-initiated on hemodialysis on 04/14/2021-anticipate lifelong hemodialysis -Continue bicarb replacements -s/p PermCath placement on 04/16/2021  -Hemodialysis today 04/18/2021, set up for outpatient hemodialysis Monday Wednesdays and Fridays   2)Acute metabolic encephalopathy due to uremia-worse overall --confusion and disorientation from time to time most likely due to #1 above. -Mental status improved with hemodialysis sessions   Hypocalcemia- -When corrected for albumin calcium is actually around 8 -Continue calcitriol and cholecalciferol.       Anemia in CKD-  -ESA/Epogen per nephrology team -Follow hemoglobin trend and further transfuse as clinically indicated.    Uncontrolled type 2 diabetes mellitus with renal complications Q6P is 6.2 reflecting excellent Diabetic control PTA -Continue insulin regimen Use Novolog/Humalog Sliding scale insulin with Accu-Cheks/Fingersticks as ordered   SVT/A. fib with RVR-hold Cardizem and  hold Lopressor due to soft BP   Coronary artery disease -no chest pain symptoms at this time.   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 Faecalis and Staph Aureus UTI--- pharmacy consult for vancomycin dosing and then HD patient -Patient will require another dose of IV vancomycin after hemodialysis session on 04/18/2021   -Generalized weakness and deconditioning--- DOT recommended home health which has been arranged     Dispo: The patient is from: Home              Anticipated d/c is to:  Home with [facility]      Family Communication:   Discussed with wife at bedside   Consults  :  Nephrology    SKIN assessment: I agree with skin assessment and plan as outlined below: Pressure Injury 01/04/21 Buttocks Left;Medial Stage 2 -  Partial thickness loss of dermis presenting as a shallow open injury with a red, pink wound  bed without slough. 2cm x 1 cm, pink wound base, no drainage or odor (Active)  01/04/21 1115  Location: Buttocks  Location Orientation: Left;Medial  Staging: Stage 2 -  Partial thickness loss of dermis presenting as a shallow open injury with a red, pink wound bed without slough.  Wound Description (Comments): 2cm x 1 cm, pink wound base, no drainage or odor  Present on Admission:      Pressure Injury 02/03/21 Sacrum Left Stage 2 -  Partial thickness loss of dermis presenting as a shallow open injury with a red, pink wound bed without slough. small open area without drainage (Active)  02/03/21 1534  Location: Sacrum  Location Orientation: Left  Staging: Stage 2 -  Partial thickness loss of dermis presenting as a shallow open injury with a red, pink wound bed without slough.  Wound Description (Comments): small open area without drainage  Present on Admission: Yes    Risk of unplanned readmission Score:     Discharge Instructions:   Discharge Instructions     Activity as tolerated - No restrictions   Complete by: As directed    Diet - low sodium heart healthy   Complete by: As directed    Discharge instructions   Complete by: As directed    Continue hemodialysis Monday Wednesday Friday   Discharge wound care:   Complete by: As directed    As directed by nursing staff   Increase activity slowly   Complete by: As directed         Medication List     STOP taking these medications    calcitRIOL 0.25 MCG capsule Commonly known as: ROCALTROL   cephALEXin 500 MG capsule Commonly known as: KEFLEX   diltiazem 240 MG 24 hr capsule Commonly known as: CARDIZEM CD   multivitamin with minerals Tabs tablet   sodium bicarbonate 650 MG tablet   Zenpep 10000-32000 units Cpep Generic drug: Pancrelipase (Lip-Prot-Amyl) Replaced by: lipase/protease/amylase 12000-38000 units Cpep capsule       TAKE these medications    aspirin EC 81 MG tablet Take 81 mg by mouth at  bedtime.   calcium acetate 667 MG capsule Commonly known as: PhosLo Take 2 capsules (1,334 mg total) by mouth 3 (three) times daily with meals.   clindamycin 300 MG capsule Commonly known as: CLEOCIN Take 1 capsule (300 mg total) by mouth 3 (three) times daily for 5 days.   ferric gluconate 125 mg in sodium chloride 0.9 % 100 mL Inject 125 mg into the vein every Monday, Wednesday, and Friday with hemodialysis.   furosemide 40 MG tablet Commonly known as: LASIX Take 40 mg by mouth daily.   insulin aspart 100 UNIT/ML injection Commonly known as: novoLOG Inject 8 Units into the skin 3 (three) times daily with meals. What changed:  how much to take when to take this   insulin glargine 100 UNIT/ML injection Commonly known as: LANTUS Inject 0.12 mLs (12 Units total) into the skin at bedtime.   lipase/protease/amylase 12000-38000 units Cpep capsule Commonly known as: CREON Take 1 capsule (12,000 Units total) by mouth with breakfast, with lunch, and  with evening meal. Replaces: Zenpep 10000-32000 units Cpep   metoprolol tartrate 50 MG tablet Commonly known as: LOPRESSOR Take 1 tablet (50 mg total) by mouth 2 (two) times daily.   mupirocin ointment 2 % Commonly known as: BACTROBAN Place 1 application into the nose 2 (two) times daily.   pravastatin 10 MG tablet Commonly known as: PRAVACHOL TAKE 1 TABLET(10 MG) BY MOUTH DAILY WITH BREAKFAST What changed:  how much to take how to take this when to take this additional instructions        Follow-up Information     [facility] Follow up.   Why: Wheelchair        [facility] Follow up.   Specialty: Home Health Services Why: RN/PT will call to schedule you first home visit, within 48 hours of discharge.        [facility] Follow up.   Why: MWF at 12;00  - First treatment Friday, please arrive by 11:45AM Contact information: [place]  [phone]                Allergies  Allergen Reactions   Enalapril Maleate Cough     Procedures /Studies:   US RENAL  Result Date: 04/13/2021 CLINICAL DATA:  Acute renal injury.  Hypertension. EXAM: RENAL / URINARY TRACT ULTRASOUND COMPLETE COMPARISON:  Ultrasound renal 12/24/2020. CT abdomen and pelvis 03/20/2013. FINDINGS: Right Kidney: Renal measurements: 12.2 x 7.0 x 7.1 cm = volume: 316 mL. There is no hydronephrosis. Echogenicity is within normal limits. No focal lesion identified. Left Kidney: Renal measurements: 12.9 x 6.2 x 7.0 cm = volume: 292 mL. There is no hydronephrosis. Echogenicity is within normal limits. No focal lesion identified. Bladder: The bilateral ureteral jets are not seen. Bladder wall appears diffusely thickened. There is some layering echogenic material in the dependent portion of the bladder. Other: None. IMPRESSION: 1. No hydronephrosis. 2. Diffuse bladder wall thickening. Correlate for cystitis. There is layering echogenic material in the bladder which may indicate infection or hemorrhage. Electronically Signed   By: Ronney Asters M.D.   On: 04/13/2021 15:42   DG Chest Port 1 View  Result Date: 04/16/2021 CLINICAL DATA:  Dialysis catheter placement EXAM: PORTABLE CHEST 1 VIEW COMPARISON:  04/13/2021 FINDINGS: Interval placement of right chest large-bore multi lumen vascular catheter, tip projecting over the right atrium. Unchanged elevation of the right hemidiaphragm, possibly with a small associated right pleural effusion. IMPRESSION: Interval placement of right chest large-bore multi lumen vascular catheter, tip projecting over the right atrium. No pneumothorax. Electronically Signed   By: Eddie Candle M.D.   On: 04/16/2021 14:40   DG Chest Port 1 View  Result Date: 04/13/2021 CLINICAL DATA:  Weakness. EXAM: PORTABLE CHEST 1 VIEW COMPARISON:  02/02/2021 FINDINGS: Right jugular dialysis catheter has been removed in the interim. Again noted is  elevation of the right hemidiaphragm. Both lungs are clear. Heart and mediastinum are stable. Atherosclerotic calcifications at the aortic arch. Trachea is midline. Surgical clips in the right upper abdomen. IMPRESSION: 1. No acute abnormality. 2. Chronic elevation of the right hemidiaphragm. Electronically Signed   By: Markus Daft M.D.   On: 04/13/2021 13:24   DG C-Arm 1-60 Min-No Report  Result Date: 04/16/2021 Fluoroscopy was utilized by the requesting physician.  No radiographic interpretation.    Subjective:   Patient was seen and examined 04/18/2021, 11:36 AM Patient stable today. No acute distress.  No issues overnight Stable for discharge.  Discharge Exam:    Vitals:   04/17/21 0600 04/17/21 2020 04/18/21 0425 04/18/21 0500  BP:  (!) 138/93 (!) 163/75   Pulse:  86 93   Resp:  19 19   Temp:  99.2 F (37.3 C) 98.3 F (36.8 C)   TempSrc:  Oral    SpO2:  97% 98%   Weight: 85.6 kg   86.2 kg  Height:        General: Pt lying comfortably in bed & appears in no obvious distress. Cardiovascular: S1 & S2 heard, RRR, S1/S2 +. No murmurs, rubs, gallops or clicks. No JVD or pedal edema. Respiratory: Clear to auscultation without wheezing, rhonchi or crackles. No increased work of breathing. Abdominal:  Non-distended, non-tender & soft. No organomegaly or masses appreciated. Normal bowel sounds heard. CNS: Alert and oriented. No focal deficits. Extremities: no edema, no cyanosis Pressure Injury 01/04/21 Buttocks Left;Medial Stage 2 -  Partial thickness loss of dermis presenting as a shallow open injury with a red, pink wound bed without slough. 2cm x 1 cm, pink wound base, no drainage or odor (Active)  01/04/21 1115  Location: Buttocks  Location Orientation: Left;Medial  Staging: Stage 2 -  Partial thickness loss of dermis presenting as a shallow open injury with a red, pink wound bed without slough.  Wound Description (Comments): 2cm x 1 cm, pink wound base, no drainage or odor   Present on Admission:      Pressure Injury 02/03/21 Sacrum Left Stage 2 -  Partial thickness loss of dermis presenting as a shallow open injury with a red, pink wound bed without slough. small open area without drainage (Active)  02/03/21 1534  Location: Sacrum  Location Orientation: Left  Staging: Stage 2 -  Partial thickness loss of dermis presenting as a shallow open injury with a red, pink wound bed without slough.  Wound Description (Comments): small open area without drainage  Present on Admission: Yes      The results of significant diagnostics from this hospitalization (including imaging, microbiology, ancillary and laboratory) are listed below for reference.      Microbiology:   Recent Results (from the past 240 hour(s))  Urine Culture     Status: None   Collection Time: 04/10/21 11:38 AM   Specimen: Urine  Result Value Ref Range Status   MICRO NUMBER: [id number]  Final   SPECIMEN QUALITY: Adequate  Final   Sample Source NOT GIVEN  Final   STATUS: FINAL  Final   ISOLATE 1:   Final    Less than 10,000 CFU/mL of single Gram positive organism isolated. No further testing will be performed. If clinically indicated, recollection using a method to minimize contamination, with prompt transfer to Urine Culture Transport Tube, is recommended.   COMMENT:   Final    Additional non-predominating organism(s) isolated. These organisms, commonly found on external and internal genitalia, are considered colonizers. No further testing performed.  MICROSCOPIC MESSAGE     Status: None   Collection Time: 04/10/21 11:38 AM  Result Value Ref Range Status   Note   Final    Comment: This urine was analyzed for the presence of WBC,  RBC, bacteria, casts, and other formed elements.  Only those elements seen were reported. . .   Blood culture (routine single)     Status: None   Collection Time: 04/13/21  1:01 PM   Specimen: BLOOD RIGHT ARM  Result Value Ref Range Status   Specimen  Description BLOOD RIGHT ARM  Final  Special Requests   Final    Blood Culture adequate volume BOTTLES DRAWN AEROBIC AND ANAEROBIC   Culture   Final    NO GROWTH 5 DAYS Performed at [facility], [address], [place], [place] [address]    Report Status 04/18/2021 FINAL  Final  Resp Panel by RT-PCR (Flu A&B, Covid) Nasopharyngeal Swab     Status: None   Collection Time: 04/13/21  1:15 PM   Specimen: Nasopharyngeal Swab; Nasopharyngeal(NP) swabs in vial transport medium  Result Value Ref Range Status   SARS Coronavirus 2 by RT PCR NEGATIVE NEGATIVE Final    Comment: (NOTE) SARS-CoV-2 target nucleic acids are NOT DETECTED.  The SARS-CoV-2 RNA is generally detectable in upper respiratory specimens during the acute phase of infection. The lowest concentration of SARS-CoV-2 viral copies this assay can detect is 138 copies/mL. A negative result does not preclude SARS-Cov-2 infection and should not be used as the sole basis for treatment or other patient management decisions. A negative result may occur with  improper specimen collection/handling, submission of specimen other than nasopharyngeal swab, presence of viral mutation(s) within the areas targeted by this assay, and inadequate number of viral copies(<138 copies/mL). A negative result must be combined with clinical observations, patient history, and epidemiological information. The expected result is Negative.  Fact Sheet for Patients:  EntrepreneurPulse.com.au  Fact Sheet for Healthcare Providers:  IncredibleEmployment.be  This test is no t yet approved or cleared by the [place] FDA and  has been authorized for detection and/or diagnosis of SARS-CoV-2 by FDA under an Emergency Use Authorization (EUA). This EUA will remain  in effect (meaning this test can be used) for the duration of the COVID-19 declaration under Section 564(b)(1) of the Act, 21 U.S.C.section 360bbb-3(b)(1), unless  the authorization is terminated  or revoked sooner.       Influenza A by PCR NEGATIVE NEGATIVE Final   Influenza B by PCR NEGATIVE NEGATIVE Final    Comment: (NOTE) The Xpert Xpress SARS-CoV-2/FLU/RSV plus assay is intended as an aid in the diagnosis of influenza from Nasopharyngeal swab specimens and should not be used as a sole basis for treatment. Nasal washings and aspirates are unacceptable for Xpert Xpress SARS-CoV-2/FLU/RSV testing.  Fact Sheet for Patients: EntrepreneurPulse.com.au  Fact Sheet for Healthcare Providers: IncredibleEmployment.be  This test is not yet approved or cleared by the [place] FDA and has been authorized for detection and/or diagnosis of SARS-CoV-2 by FDA under an Emergency Use Authorization (EUA). This EUA will remain in effect (meaning this test can be used) for the duration of the COVID-19 declaration under Section 564(b)(1) of the Act, 21 U.S.C. section 360bbb-3(b)(1), unless the authorization is terminated or revoked.  Performed at [facility], [address], [place], [place] [address]   Urine Culture     Status: Abnormal (Preliminary result)   Collection Time: 04/13/21  3:01 PM   Specimen: In/Out Cath Urine  Result Value Ref Range Status   Specimen Description   Final    IN/OUT CATH URINE Performed at [facility], [address] [place], [place] [address]    Special Requests   Final    NONE Performed at [facility], [address] [place], [place] [address]    Culture (A)  Final    40,000 COLONIES/mL METHICILLIN RESISTANT STAPHYLOCOCCUS AUREUS 10,000 COLONIES/mL VANCOMYCIN RESISTANT ENTEROCOCCUS    Report Status PENDING  Incomplete   Organism ID, Bacteria METHICILLIN RESISTANT STAPHYLOCOCCUS AUREUS (A)  Final   Organism ID, Bacteria VANCOMYCIN RESISTANT  ENTEROCOCCUS (A)  Final      Susceptibility   Methicillin resistant staphylococcus aureus - MIC*    CIPROFLOXACIN >=8 RESISTANT  Resistant     GENTAMICIN <=0.5 SENSITIVE Sensitive     NITROFURANTOIN <=16 SENSITIVE Sensitive     OXACILLIN >=4 RESISTANT Resistant     TETRACYCLINE <=1 SENSITIVE Sensitive     VANCOMYCIN 1 SENSITIVE Sensitive     TRIMETH/SULFA <=10 SENSITIVE Sensitive     CLINDAMYCIN >=8 RESISTANT Resistant     RIFAMPIN <=0.5 SENSITIVE Sensitive     Inducible Clindamycin NEGATIVE Sensitive     * 40,000 COLONIES/mL METHICILLIN RESISTANT STAPHYLOCOCCUS AUREUS   Vancomycin resistant enterococcus - MIC*    AMPICILLIN <=2 SENSITIVE Sensitive     NITROFURANTOIN <=16 SENSITIVE Sensitive     VANCOMYCIN >=32 RESISTANT Resistant     LINEZOLID 2 SENSITIVE Sensitive     * 10,000 COLONIES/mL VANCOMYCIN RESISTANT ENTEROCOCCUS  Surgical PCR screen     Status: Abnormal   Collection Time: 04/16/21  2:20 AM   Specimen: Nasal Mucosa; Nasal Swab  Result Value Ref Range Status   MRSA, PCR NEGATIVE NEGATIVE Final   Staphylococcus aureus POSITIVE (A) NEGATIVE Final    Comment: (NOTE) The Xpert SA Assay (FDA approved for NASAL specimens in patients 37 years of age and older), is one component of a comprehensive surveillance program. It is not intended to diagnose infection nor to guide or monitor treatment. Performed at [facility], [address] [place], [place] [address]      Labs:   CBC: Recent Labs  Lab 04/12/21 1640 04/13/21 1230 04/14/21 0634 04/17/21 0632 04/18/21 0558  WBC 11.5* 13.1* 15.7* 11.0* 9.4  NEUTROABS 8,924* 10.5*  --   --   --   HGB 9.0* 8.8* 8.0* 8.1* 8.0*  HCT 29.2* 28.5* 26.9* 25.8* 25.8*  MCV 97.7 101.1* 101.9* 97.0 99.6  PLT 231 247 212 245 150   Basic Metabolic Panel: Recent Labs  Lab 04/14/21 0634 04/15/21 0551 04/16/21 0527 04/17/21 0632 04/18/21 0558  NA 142  144 139 138 139 141  143  K 4.0  4.1 2.9* 3.0* 3.3* 3.6  3.8  CL 118*  119* 110 108 102 104  106  CO2 7*  8* 19* 17* 23 24  25   GLUCOSE 47*  47* 88 125* 105* 131*  128*  BUN 102*  112* 62* 72* 40*  55*  55*  CREATININE 5.20*  5.30* 3.46* 4.24* 2.70* 3.44*  3.37*  CALCIUM 7.0*  7.1* 6.2* 6.3* 7.0* 7.1*  7.3*  PHOS 11.9* 6.0* 6.8* 4.6 6.0*   Liver Function Tests: Recent Labs  Lab 04/12/21 1640 04/13/21 1230 04/13/21 1230 04/14/21 0634 04/15/21 0551 04/16/21 0527 04/17/21 0632 04/18/21 0558  AST 13 14*  --   --   --   --   --  16  ALT 16 19  --   --   --   --   --  9  ALKPHOS  --  134*  --   --   --   --   --  143*  BILITOT 0.3 0.2*  --   --   --   --   --  0.3  PROT 6.2 6.7  --   --   --   --   --  5.6*  ALBUMIN  --  2.6*   < > 2.4* 2.0* 2.0* 1.9* 2.0*  2.0*   < > = values in this interval not displayed.  BNP (last 3 results) Recent Labs    04/13/21 1230  BNP 700.0*   Cardiac Enzymes: No results for input(s): CKTOTAL, CKMB, CKMBINDEX, TROPONINI in the last 168 hours. CBG: Recent Labs  Lab 04/17/21 1156 04/17/21 1651 04/17/21 2358 04/18/21 0559 04/18/21 1121  GLUCAP 181* 179* 221* 121* 235*   Hgb A1c No results for input(s): HGBA1C in the last 72 hours. Lipid Profile No results for input(s): CHOL, HDL, LDLCALC, TRIG, CHOLHDL, LDLDIRECT in the last 72 hours. Thyroid function studies No results for input(s): TSH, T4TOTAL, T3FREE, THYROIDAB in the last 72 hours.  Invalid input(s): FREET3 Anemia work up No results for input(s): VITAMINB12, FOLATE, FERRITIN, TIBC, IRON, RETICCTPCT in the last 72 hours. Urinalysis    Component Value Date/Time   COLORURINE YELLOW 04/13/2021 1501   APPEARANCEUR HAZY (A) 04/13/2021 1501   LABSPEC >1.030 (H) 04/13/2021 1501   PHURINE 5.0 04/13/2021 1501   GLUCOSEU NEGATIVE 04/13/2021 1501   HGBUR TRACE (A) 04/13/2021 1501   BILIRUBINUR NEGATIVE 04/13/2021 1501   KETONESUR NEGATIVE 04/13/2021 1501   PROTEINUR 30 (A) 04/13/2021 1501   UROBILINOGEN 0.2 03/20/2013 2016   NITRITE NEGATIVE 04/13/2021 1501   LEUKOCYTESUR NEGATIVE 04/13/2021 1501   Pressure Injury 01/04/21 Buttocks Left;Medial Stage 2 -  Partial thickness  loss of dermis presenting as a shallow open injury with a red, pink wound bed without slough. 2cm x 1 cm, pink wound base, no drainage or odor (Active)  01/04/21 1115  Location: Buttocks  Location Orientation: Left;Medial  Staging: Stage 2 -  Partial thickness loss of dermis presenting as a shallow open injury with a red, pink wound bed without slough.  Wound Description (Comments): 2cm x 1 cm, pink wound base, no drainage or odor  Present on Admission:      Pressure Injury 02/03/21 Sacrum Left Stage 2 -  Partial thickness loss of dermis presenting as a shallow open injury with a red, pink wound bed without slough. small open area without drainage (Active)  02/03/21 1534  Location: Sacrum  Location Orientation: Left  Staging: Stage 2 -  Partial thickness loss of dermis presenting as a shallow open injury with a red, pink wound bed without slough.  Wound Description (Comments): small open area without drainage  Present on Admission: Yes       Time coordinating discharge: Over 45 minutes  SIGNED: Deatra James, MD, FACP, [facility]. [facility],  Please use [email or web address] to Page If 7PM-7AM, please contact night-coverage [email or web address] 04/18/2021, 11:36 AM

## 2021-04-18 NOTE — Care Management Important Message (Signed)
Important Message  Patient Details  Name: Mark HENSEN Sr. MRN: [id number] Date of Birth: 01/27/1941   Medicare Important Message Given:  Yes     Tommy Medal 04/18/2021, 1:35 PM

## 2021-04-18 NOTE — TOC Transition Note (Signed)
Transition of Care (TOC) - CM/SW Discharge Note   Patient Details  Name: Mark STANDAGE Sr. MRN: [id number] Date of Birth: [birth year]  Transition of Care [facility]) CM/SW Contact:  Boneta Lucks, RN Phone Number: 04/18/2021, 12:18 PM   Clinical Narrative:   Patient medically ready to discharge home with [facility] after Dialysis today.  Chair time confirmed, MWF at noon at [employer] in [place].  Linda with [facility] updated with discharge, orders have be placed.   Final next level of care: [place] Barriers to Discharge: Barriers Resolved   Patient Goals and CMS Choice Patient states their goals for this hospitalization and ongoing recovery are:: agreeable to [facility]. CMS Medicare.gov Compare Post Acute Care list provided to:: Patient Represenative (must comment) Choice offered to / list presented to : Spouse  Discharge Placement              Patient chooses bed at:  [facility]) Patient to be transferred to facility by: Family   Patient and family notified of of transfer: 04/18/21  Discharge Plan and Services       DME Agency: [employer] Date DME Agency Contacted: 04/16/21 Time DME Agency Contacted: [id number] Representative spoke with at DME Agency: Caryl Pina HH Arranged: RN, PT [facility] Agency: [place] ([place]) Date [place]: 04/17/21 Time [place]: [place] Representative spoke with at [place]: Romualdo Bolk   Readmission Risk Interventions Readmission Risk Prevention Plan 04/18/2021 04/16/2021 12/04/2018  Transportation Screening - Complete Complete  PCP or Specialist Appt within 3-5 Days - - (No Data)  [place] or [place] - - Complete  Social Work Consult for [place] Planning/Counseling - - Complete  Palliative Care Screening - - Not Applicable  Medication Review Press photographer) - Complete Complete  [place] or [place] - Complete -  SW Recovery Care/Counseling Consult - Complete -  Palliative Care Screening  - Not Applicable -  [place] Not Applicable Not Complete -  Some recent data might be hidden

## 2021-04-18 NOTE — Progress Notes (Signed)
Patient taken down to dialysis.

## 2021-04-18 NOTE — Evaluation (Signed)
Occupational Therapy Evaluation Patient Details Name: [name] Sr. MRN: [id number] DOB: [birth year] Today's Date: 04/18/2021   History of Present Illness [name] is a 80 yo known to me with a prior tunneled catheter placed that had this removed with improved renal function who now needs another tunneled catheter placed. He had a femoral line placed over the weekend for dialysis. He presented to the hospital with worsening renal function and encephalopathy.  Nephrology referred him for tunneled catheter placement while he awaits fistula placement.   Clinical Impression   Pt agreeable to OT evaluation. Pt seated on toilet upon start of session. Pt required mod A via NT assisting with peri-care while pt stood using RW. Pt required min G assist for transfer from toilet to chair. Pt demonstrates generalized B UE weakness via MMT. Pt left in chair, call bell within reach, and NT present in room. Pt will benefit from continued OT in the hospital and recommended venue below to increase strength, balance, and endurance for safe ADL's.        Recommendations for follow up therapy are one component of a multi-disciplinary discharge planning process, led by the attending physician.  Recommendations may be updated based on patient status, additional functional criteria and insurance authorization.   Follow Up Recommendations  Home health OT;Supervision - Intermittent    Equipment Recommendations  None recommended by OT           Precautions / Restrictions Precautions Precautions: Fall Restrictions Weight Bearing Restrictions: No      Mobility Bed Mobility                    Transfers Overall transfer level: Needs assistance Equipment used: Rolling walker (2 wheeled) Transfers: Sit to/from [employer] Transfers Sit to Stand: Min guard Stand pivot transfers: Min guard       General transfer comment: Min G assist for safety with RW    Balance Overall balance  assessment: Needs assistance Sitting-balance support: Bilateral upper extremity supported;Feet supported Sitting balance-Leahy Scale: Good Sitting balance - Comments: seated in chair leaning forward   Standing balance support: During functional activity;Bilateral upper extremity supported Standing balance-Leahy Scale: Fair Standing balance comment: using RW                           ADL either performed or assessed with clinical judgement   ADL Overall ADL's : Needs assistance/impaired                     Lower Body Dressing: Maximal assistance;Sitting/lateral leans Lower Body Dressing Details (indicate cue type and reason): Pt reports that wife assist with donning socks at home. Toilet Transfer: Min Engineer, water Details (indicate cue type and reason): toilet to chair transfer with RW Toileting- Clothing Manipulation and Hygiene: Moderate assistance;Sit to/from stand Toileting - Clothing Manipulation Details (indicate cue type and reason): Pt was assisted by NT this date. Pt reports he is able to wipe on hiw own using lateral leans.             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) Ability to See in Adequate Light: 0 Adequate Patient Visual Report: No change from baseline                  Pertinent Vitals/Pain Pain Assessment: No/denies pain     Hand Dominance Right   Extremity/Trunk Assessment Upper Extremity Assessment Upper  Extremity Assessment: Generalized weakness   Lower Extremity Assessment Lower Extremity Assessment: Defer to PT evaluation   Cervical / Trunk Assessment Cervical / Trunk Assessment: Kyphotic   Communication Communication Communication: HOH   Cognition Arousal/Alertness: Awake/alert Behavior During Therapy: WFL for tasks assessed/performed Overall Cognitive Status: Within Functional Limits for tasks assessed                                                 Home Living Family/patient expects to be discharged to:: Private residence Living Arrangements: Spouse/significant other Available Help at Discharge: Family;Available 24 hours/day Type of Home: House Home Access: Ramped entrance     Home Layout: One level     Bathroom Shower/Tub: Other (comment) ([place] tub with door)   Bathroom Toilet: Standard Bathroom Accessibility: Yes   Home Equipment: Walker - 2 wheels;Cane - single point;Grab bars - tub/shower;Grab bars - toilet;Bedside commode;Shower seat - built in   Additional Comments: taken via doc review of PT note      Prior Functioning/Environment Level of Independence: Needs assistance  Gait / Transfers Assistance Needed: household ambulator using RW ADL's / Homemaking Assistance Needed: Pt is assisted PRN for ADL's ; pt reports independent IADL's.            OT Problem List: Decreased strength;Impaired balance (sitting and/or standing)      OT Treatment/Interventions: Self-care/ADL training;Therapeutic exercise;Patient/family education;Balance training;Therapeutic activities    OT Goals(Current goals can be found in the care plan section) Acute Rehab OT Goals Patient Stated Goal: return home with family to assist OT Goal Formulation: With patient Time For Goal Achievement: 05/02/21 Potential to Achieve Goals: Good  OT Frequency: Min 1X/week                                              End of Session Equipment Utilized During Treatment: Surveyor, mining Communication: Other (comment) (Nursing assisted with peri-care and was present during transfer.)  Activity Tolerance: Patient tolerated treatment well Patient left: in chair;with call bell/phone within reach;with nursing/sitter in room  OT Visit Diagnosis: Unsteadiness on feet (R26.81);Muscle weakness (generalized) (M62.81)                Time: 4158-3094 OT Time Calculation (min): 26 min Charges:  OT General  Charges $OT Visit: 1 Visit OT Evaluation $OT Eval Low Complexity: 1 Low  Annalina Needles OT, MOT  Larey Seat 04/18/2021, 10:06 AM

## 2021-04-19 ENCOUNTER — Telehealth: Payer: Self-pay | Admitting: Family Medicine

## 2021-04-19 DIAGNOSIS — N179 Acute kidney failure, unspecified: Secondary | ICD-10-CM | POA: Diagnosis not present

## 2021-04-19 DIAGNOSIS — E1165 Type 2 diabetes mellitus with hyperglycemia: Secondary | ICD-10-CM | POA: Diagnosis not present

## 2021-04-19 DIAGNOSIS — I1311 Hypertensive heart and chronic kidney disease without heart failure, with stage 5 chronic kidney disease, or end stage renal disease: Secondary | ICD-10-CM | POA: Diagnosis not present

## 2021-04-19 DIAGNOSIS — N186 End stage renal disease: Secondary | ICD-10-CM | POA: Diagnosis not present

## 2021-04-19 DIAGNOSIS — L989 Disorder of the skin and subcutaneous tissue, unspecified: Secondary | ICD-10-CM | POA: Diagnosis not present

## 2021-04-19 DIAGNOSIS — I2510 Atherosclerotic heart disease of native coronary artery without angina pectoris: Secondary | ICD-10-CM | POA: Diagnosis not present

## 2021-04-19 DIAGNOSIS — H9191 Unspecified hearing loss, right ear: Secondary | ICD-10-CM | POA: Diagnosis not present

## 2021-04-19 DIAGNOSIS — E1122 Type 2 diabetes mellitus with diabetic chronic kidney disease: Secondary | ICD-10-CM | POA: Diagnosis not present

## 2021-04-19 DIAGNOSIS — M1990 Unspecified osteoarthritis, unspecified site: Secondary | ICD-10-CM | POA: Diagnosis not present

## 2021-04-19 DIAGNOSIS — Z7982 Long term (current) use of aspirin: Secondary | ICD-10-CM | POA: Diagnosis not present

## 2021-04-19 DIAGNOSIS — R011 Cardiac murmur, unspecified: Secondary | ICD-10-CM | POA: Diagnosis not present

## 2021-04-19 DIAGNOSIS — E113311 Type 2 diabetes mellitus with moderate nonproliferative diabetic retinopathy with macular edema, right eye: Secondary | ICD-10-CM | POA: Diagnosis not present

## 2021-04-19 DIAGNOSIS — B9561 Methicillin susceptible Staphylococcus aureus infection as the cause of diseases classified elsewhere: Secondary | ICD-10-CM | POA: Diagnosis not present

## 2021-04-19 DIAGNOSIS — Z8546 Personal history of malignant neoplasm of prostate: Secondary | ICD-10-CM | POA: Diagnosis not present

## 2021-04-19 DIAGNOSIS — E782 Mixed hyperlipidemia: Secondary | ICD-10-CM | POA: Diagnosis not present

## 2021-04-19 DIAGNOSIS — Z794 Long term (current) use of insulin: Secondary | ICD-10-CM | POA: Diagnosis not present

## 2021-04-19 DIAGNOSIS — N390 Urinary tract infection, site not specified: Secondary | ICD-10-CM | POA: Diagnosis not present

## 2021-04-19 DIAGNOSIS — G8929 Other chronic pain: Secondary | ICD-10-CM | POA: Diagnosis not present

## 2021-04-19 DIAGNOSIS — M47816 Spondylosis without myelopathy or radiculopathy, lumbar region: Secondary | ICD-10-CM | POA: Diagnosis not present

## 2021-04-19 DIAGNOSIS — B952 Enterococcus as the cause of diseases classified elsewhere: Secondary | ICD-10-CM | POA: Diagnosis not present

## 2021-04-19 DIAGNOSIS — K8689 Other specified diseases of pancreas: Secondary | ICD-10-CM | POA: Diagnosis not present

## 2021-04-19 DIAGNOSIS — K760 Fatty (change of) liver, not elsewhere classified: Secondary | ICD-10-CM | POA: Diagnosis not present

## 2021-04-19 DIAGNOSIS — Z992 Dependence on renal dialysis: Secondary | ICD-10-CM | POA: Diagnosis not present

## 2021-04-19 DIAGNOSIS — I4891 Unspecified atrial fibrillation: Secondary | ICD-10-CM | POA: Diagnosis not present

## 2021-04-19 LAB — URINE CULTURE: Culture: 40000 — AB

## 2021-04-19 NOTE — Telephone Encounter (Signed)
Received call from Precious Haws with [place]. Requesting 1 visit per week for 3 weeks and 2 PRN visits. Kristi mentioned blackened area on left lower leg on outer part; goes around to back of calf. Patient stated he's not receiving wound care for that area; Kristi wants to know if provider has specific wound care orders.  Patient will be starting dialysis tomorrow.    Please advise at [phone]. Ok to leave a message if she doesn't answer.

## 2021-04-19 NOTE — Telephone Encounter (Signed)
Call placed to [facility] with VO for plan of care.   Advised that [facility] SN can used wound care at their discretion.

## 2021-04-20 ENCOUNTER — Encounter: Payer: Self-pay | Admitting: Family Medicine

## 2021-04-20 ENCOUNTER — Other Ambulatory Visit: Payer: Self-pay | Admitting: Family Medicine

## 2021-04-20 DIAGNOSIS — Z992 Dependence on renal dialysis: Secondary | ICD-10-CM | POA: Diagnosis not present

## 2021-04-20 DIAGNOSIS — D509 Iron deficiency anemia, unspecified: Secondary | ICD-10-CM | POA: Diagnosis not present

## 2021-04-20 DIAGNOSIS — Z23 Encounter for immunization: Secondary | ICD-10-CM | POA: Diagnosis not present

## 2021-04-20 DIAGNOSIS — D689 Coagulation defect, unspecified: Secondary | ICD-10-CM | POA: Diagnosis not present

## 2021-04-20 DIAGNOSIS — D631 Anemia in chronic kidney disease: Secondary | ICD-10-CM | POA: Diagnosis not present

## 2021-04-20 DIAGNOSIS — N186 End stage renal disease: Secondary | ICD-10-CM | POA: Diagnosis not present

## 2021-04-20 DIAGNOSIS — N2581 Secondary hyperparathyroidism of renal origin: Secondary | ICD-10-CM | POA: Diagnosis not present

## 2021-04-20 MED ORDER — HYDROCODONE-ACETAMINOPHEN 5-325 MG PO TABS: 1.0000 | ORAL_TABLET | Freq: Four times a day (QID) | ORAL | 0 refills | Status: DC | PRN

## 2021-04-23 ENCOUNTER — Telehealth: Payer: Self-pay

## 2021-04-23 DIAGNOSIS — D509 Iron deficiency anemia, unspecified: Secondary | ICD-10-CM | POA: Diagnosis not present

## 2021-04-23 DIAGNOSIS — E876 Hypokalemia: Secondary | ICD-10-CM | POA: Diagnosis not present

## 2021-04-23 DIAGNOSIS — E1122 Type 2 diabetes mellitus with diabetic chronic kidney disease: Secondary | ICD-10-CM | POA: Diagnosis not present

## 2021-04-23 DIAGNOSIS — D689 Coagulation defect, unspecified: Secondary | ICD-10-CM | POA: Diagnosis not present

## 2021-04-23 DIAGNOSIS — Z992 Dependence on renal dialysis: Secondary | ICD-10-CM | POA: Diagnosis not present

## 2021-04-23 DIAGNOSIS — N2581 Secondary hyperparathyroidism of renal origin: Secondary | ICD-10-CM | POA: Diagnosis not present

## 2021-04-23 DIAGNOSIS — N186 End stage renal disease: Secondary | ICD-10-CM | POA: Diagnosis not present

## 2021-04-23 NOTE — Telephone Encounter (Signed)
Transition Care Management Unsuccessful Follow-up Telephone Call  Date of discharge and from where:  04/18/21 from [facility]  Attempts:  1st Attempt  Reason for unsuccessful TCM follow-up call:  Left voice message  Thea Silversmith, RN, MSN, BSN, [place] Care Management Coordinator [phone]

## 2021-04-24 ENCOUNTER — Telehealth: Payer: Self-pay

## 2021-04-24 DIAGNOSIS — M47816 Spondylosis without myelopathy or radiculopathy, lumbar region: Secondary | ICD-10-CM | POA: Diagnosis not present

## 2021-04-24 DIAGNOSIS — N390 Urinary tract infection, site not specified: Secondary | ICD-10-CM | POA: Diagnosis not present

## 2021-04-24 DIAGNOSIS — L989 Disorder of the skin and subcutaneous tissue, unspecified: Secondary | ICD-10-CM | POA: Diagnosis not present

## 2021-04-24 DIAGNOSIS — K8689 Other specified diseases of pancreas: Secondary | ICD-10-CM | POA: Diagnosis not present

## 2021-04-24 DIAGNOSIS — I1311 Hypertensive heart and chronic kidney disease without heart failure, with stage 5 chronic kidney disease, or end stage renal disease: Secondary | ICD-10-CM | POA: Diagnosis not present

## 2021-04-24 DIAGNOSIS — Z8546 Personal history of malignant neoplasm of prostate: Secondary | ICD-10-CM | POA: Diagnosis not present

## 2021-04-24 DIAGNOSIS — Z794 Long term (current) use of insulin: Secondary | ICD-10-CM | POA: Diagnosis not present

## 2021-04-24 DIAGNOSIS — B9561 Methicillin susceptible Staphylococcus aureus infection as the cause of diseases classified elsewhere: Secondary | ICD-10-CM | POA: Diagnosis not present

## 2021-04-24 DIAGNOSIS — E113311 Type 2 diabetes mellitus with moderate nonproliferative diabetic retinopathy with macular edema, right eye: Secondary | ICD-10-CM | POA: Diagnosis not present

## 2021-04-24 DIAGNOSIS — I2510 Atherosclerotic heart disease of native coronary artery without angina pectoris: Secondary | ICD-10-CM | POA: Diagnosis not present

## 2021-04-24 DIAGNOSIS — Z992 Dependence on renal dialysis: Secondary | ICD-10-CM | POA: Diagnosis not present

## 2021-04-24 DIAGNOSIS — K760 Fatty (change of) liver, not elsewhere classified: Secondary | ICD-10-CM | POA: Diagnosis not present

## 2021-04-24 DIAGNOSIS — E1122 Type 2 diabetes mellitus with diabetic chronic kidney disease: Secondary | ICD-10-CM | POA: Diagnosis not present

## 2021-04-24 DIAGNOSIS — N179 Acute kidney failure, unspecified: Secondary | ICD-10-CM | POA: Diagnosis not present

## 2021-04-24 DIAGNOSIS — N186 End stage renal disease: Secondary | ICD-10-CM | POA: Diagnosis not present

## 2021-04-24 DIAGNOSIS — H9191 Unspecified hearing loss, right ear: Secondary | ICD-10-CM | POA: Diagnosis not present

## 2021-04-24 DIAGNOSIS — E1165 Type 2 diabetes mellitus with hyperglycemia: Secondary | ICD-10-CM | POA: Diagnosis not present

## 2021-04-24 DIAGNOSIS — B952 Enterococcus as the cause of diseases classified elsewhere: Secondary | ICD-10-CM | POA: Diagnosis not present

## 2021-04-24 DIAGNOSIS — G8929 Other chronic pain: Secondary | ICD-10-CM | POA: Diagnosis not present

## 2021-04-24 DIAGNOSIS — I4891 Unspecified atrial fibrillation: Secondary | ICD-10-CM | POA: Diagnosis not present

## 2021-04-24 DIAGNOSIS — E782 Mixed hyperlipidemia: Secondary | ICD-10-CM | POA: Diagnosis not present

## 2021-04-24 DIAGNOSIS — R011 Cardiac murmur, unspecified: Secondary | ICD-10-CM | POA: Diagnosis not present

## 2021-04-24 DIAGNOSIS — M1990 Unspecified osteoarthritis, unspecified site: Secondary | ICD-10-CM | POA: Diagnosis not present

## 2021-04-24 DIAGNOSIS — Z7982 Long term (current) use of aspirin: Secondary | ICD-10-CM | POA: Diagnosis not present

## 2021-04-24 NOTE — Telephone Encounter (Signed)
Transition Care Management Follow-up Telephone Call Date of discharge and from where: 04/18/21 from [facility] How have you been since you were released from the hospital? I been doing pretty good, but "legs swell up".  Any questions or concerns? Yes concern regarding leg swelling, but states "Today they are not as swelled as much".   Items Reviewed: Did the pt receive and understand the discharge instructions provided? Yes Medications obtained and verified? Yes  Other?  Not applicable Any new allergies since your discharge? No  Dietary orders reviewed? Yes Do you have support at home? Yes   Home Care and Equipment/Supplies: Were home health services ordered? yes If so, what is the name of the agency? N/a  Has the agency set up a time to come to the patient's home? yes Were any new equipment or medical supplies ordered?  Yes: wheelchair What is the name of the medical supply agency? Were you able to get the supplies/equipment? not applicable Do you have any questions related to the use of the equipment or supplies? No  Functional Questionnaire: (I = Independent and D = Dependent) ADLs: I  Bathing/Dressing- assistance  Meal Prep- Assistance  Eating- I  Maintaining continence- I  Transferring/Ambulation- I with walker  Managing Meds- I (with wife)  Follow up appointments reviewed:  PCP Hospital f/u appt confirmed? No , not applicable, have communicated via mychart. [facility] f/u appt confirmed? Yes  HD clinic M-W-F. Scheduled to see Bernerd Pho, PA-C 05/25/21 and scheduled for wound clinic on 05/02/21.  Are transportation arrangements needed? No  If their condition worsens, is the pt aware to call PCP or go to the Emergency Dept.? Yes Was the patient provided with contact information for the PCP's office or ED? Yes Was to pt encouraged to call back with questions or concerns? Yes  New to Hemodialysis, RNCM encouraged patient to communicate with dialysis staff  regarding any questions or concerns, encouraged to find out when the doctors make rounds. Reports he did not feel like having home health person come to see him today. RNCM encouraged to allow home health staff come in to assist in his health care needs.   Thea Silversmith, RN, MSN, BSN, CCM Care Management Coordinator [facility] [phone]

## 2021-04-25 ENCOUNTER — Other Ambulatory Visit: Payer: Self-pay | Admitting: *Deleted

## 2021-04-25 ENCOUNTER — Telehealth: Payer: Self-pay | Admitting: *Deleted

## 2021-04-25 DIAGNOSIS — N186 End stage renal disease: Secondary | ICD-10-CM

## 2021-04-25 NOTE — Telephone Encounter (Signed)
Received call from [place], [facility] PT with [place] [phone]~ telephone.   Requested VO to extend [facility] PT services 2x weekly x4 weeks for transfers, balance, ambulatory safety, flexibility, and lower extremity strengthening.   Also requested referral for wound care center for rash to BLE. Referral orders placed per patient wife request to [place] at [facility].

## 2021-04-26 ENCOUNTER — Encounter (HOSPITAL_COMMUNITY)
Admission: RE | Admit: 2021-04-26 | Discharge: 2021-04-26 | Disposition: A | Discharge status: Home / Self Care | Payer: PPO | Source: Ambulatory Visit | Attending: Nephrology | Admitting: Nephrology

## 2021-04-30 DIAGNOSIS — D631 Anemia in chronic kidney disease: Secondary | ICD-10-CM | POA: Diagnosis not present

## 2021-04-30 DIAGNOSIS — E876 Hypokalemia: Secondary | ICD-10-CM | POA: Diagnosis not present

## 2021-04-30 DIAGNOSIS — A419 Sepsis, unspecified organism: Secondary | ICD-10-CM | POA: Diagnosis not present

## 2021-04-30 DIAGNOSIS — N186 End stage renal disease: Secondary | ICD-10-CM | POA: Diagnosis not present

## 2021-04-30 DIAGNOSIS — N189 Chronic kidney disease, unspecified: Secondary | ICD-10-CM | POA: Diagnosis not present

## 2021-04-30 DIAGNOSIS — N2581 Secondary hyperparathyroidism of renal origin: Secondary | ICD-10-CM | POA: Diagnosis not present

## 2021-04-30 DIAGNOSIS — D689 Coagulation defect, unspecified: Secondary | ICD-10-CM | POA: Diagnosis not present

## 2021-04-30 DIAGNOSIS — Z992 Dependence on renal dialysis: Secondary | ICD-10-CM | POA: Diagnosis not present

## 2021-04-30 DIAGNOSIS — R52 Pain, unspecified: Secondary | ICD-10-CM | POA: Diagnosis not present

## 2021-04-30 DIAGNOSIS — D509 Iron deficiency anemia, unspecified: Secondary | ICD-10-CM | POA: Diagnosis not present

## 2021-04-30 DIAGNOSIS — G9341 Metabolic encephalopathy: Secondary | ICD-10-CM | POA: Diagnosis not present

## 2021-05-01 DIAGNOSIS — K760 Fatty (change of) liver, not elsewhere classified: Secondary | ICD-10-CM | POA: Diagnosis not present

## 2021-05-01 DIAGNOSIS — I1311 Hypertensive heart and chronic kidney disease without heart failure, with stage 5 chronic kidney disease, or end stage renal disease: Secondary | ICD-10-CM | POA: Diagnosis not present

## 2021-05-01 DIAGNOSIS — E782 Mixed hyperlipidemia: Secondary | ICD-10-CM | POA: Diagnosis not present

## 2021-05-01 DIAGNOSIS — Z992 Dependence on renal dialysis: Secondary | ICD-10-CM | POA: Diagnosis not present

## 2021-05-01 DIAGNOSIS — N179 Acute kidney failure, unspecified: Secondary | ICD-10-CM | POA: Diagnosis not present

## 2021-05-01 DIAGNOSIS — G8929 Other chronic pain: Secondary | ICD-10-CM | POA: Diagnosis not present

## 2021-05-01 DIAGNOSIS — H9191 Unspecified hearing loss, right ear: Secondary | ICD-10-CM | POA: Diagnosis not present

## 2021-05-01 DIAGNOSIS — M47816 Spondylosis without myelopathy or radiculopathy, lumbar region: Secondary | ICD-10-CM | POA: Diagnosis not present

## 2021-05-01 DIAGNOSIS — B9561 Methicillin susceptible Staphylococcus aureus infection as the cause of diseases classified elsewhere: Secondary | ICD-10-CM | POA: Diagnosis not present

## 2021-05-01 DIAGNOSIS — Z794 Long term (current) use of insulin: Secondary | ICD-10-CM | POA: Diagnosis not present

## 2021-05-01 DIAGNOSIS — L989 Disorder of the skin and subcutaneous tissue, unspecified: Secondary | ICD-10-CM | POA: Diagnosis not present

## 2021-05-01 DIAGNOSIS — E1165 Type 2 diabetes mellitus with hyperglycemia: Secondary | ICD-10-CM | POA: Diagnosis not present

## 2021-05-01 DIAGNOSIS — Z7982 Long term (current) use of aspirin: Secondary | ICD-10-CM | POA: Diagnosis not present

## 2021-05-01 DIAGNOSIS — N390 Urinary tract infection, site not specified: Secondary | ICD-10-CM | POA: Diagnosis not present

## 2021-05-01 DIAGNOSIS — R011 Cardiac murmur, unspecified: Secondary | ICD-10-CM | POA: Diagnosis not present

## 2021-05-01 DIAGNOSIS — E113311 Type 2 diabetes mellitus with moderate nonproliferative diabetic retinopathy with macular edema, right eye: Secondary | ICD-10-CM | POA: Diagnosis not present

## 2021-05-01 DIAGNOSIS — E1122 Type 2 diabetes mellitus with diabetic chronic kidney disease: Secondary | ICD-10-CM | POA: Diagnosis not present

## 2021-05-01 DIAGNOSIS — N186 End stage renal disease: Secondary | ICD-10-CM | POA: Diagnosis not present

## 2021-05-01 DIAGNOSIS — K8689 Other specified diseases of pancreas: Secondary | ICD-10-CM | POA: Diagnosis not present

## 2021-05-01 DIAGNOSIS — M1990 Unspecified osteoarthritis, unspecified site: Secondary | ICD-10-CM | POA: Diagnosis not present

## 2021-05-01 DIAGNOSIS — I4891 Unspecified atrial fibrillation: Secondary | ICD-10-CM | POA: Diagnosis not present

## 2021-05-01 DIAGNOSIS — Z8546 Personal history of malignant neoplasm of prostate: Secondary | ICD-10-CM | POA: Diagnosis not present

## 2021-05-01 DIAGNOSIS — B952 Enterococcus as the cause of diseases classified elsewhere: Secondary | ICD-10-CM | POA: Diagnosis not present

## 2021-05-01 DIAGNOSIS — I2510 Atherosclerotic heart disease of native coronary artery without angina pectoris: Secondary | ICD-10-CM | POA: Diagnosis not present

## 2021-05-02 ENCOUNTER — Encounter: Payer: Self-pay | Admitting: Family Medicine

## 2021-05-02 DIAGNOSIS — Z8546 Personal history of malignant neoplasm of prostate: Secondary | ICD-10-CM | POA: Diagnosis not present

## 2021-05-02 DIAGNOSIS — Z9079 Acquired absence of other genital organ(s): Secondary | ICD-10-CM | POA: Diagnosis not present

## 2021-05-02 DIAGNOSIS — L819 Disorder of pigmentation, unspecified: Secondary | ICD-10-CM | POA: Diagnosis not present

## 2021-05-02 DIAGNOSIS — N529 Male erectile dysfunction, unspecified: Secondary | ICD-10-CM | POA: Diagnosis not present

## 2021-05-02 DIAGNOSIS — Z79899 Other long term (current) drug therapy: Secondary | ICD-10-CM | POA: Diagnosis not present

## 2021-05-02 DIAGNOSIS — Z794 Long term (current) use of insulin: Secondary | ICD-10-CM | POA: Diagnosis not present

## 2021-05-02 DIAGNOSIS — Z9049 Acquired absence of other specified parts of digestive tract: Secondary | ICD-10-CM | POA: Diagnosis not present

## 2021-05-02 DIAGNOSIS — Z7982 Long term (current) use of aspirin: Secondary | ICD-10-CM | POA: Diagnosis not present

## 2021-05-02 DIAGNOSIS — E119 Type 2 diabetes mellitus without complications: Secondary | ICD-10-CM | POA: Diagnosis not present

## 2021-05-02 DIAGNOSIS — L97829 Non-pressure chronic ulcer of other part of left lower leg with unspecified severity: Secondary | ICD-10-CM | POA: Diagnosis not present

## 2021-05-02 DIAGNOSIS — Z96643 Presence of artificial hip joint, bilateral: Secondary | ICD-10-CM | POA: Diagnosis not present

## 2021-05-03 ENCOUNTER — Telehealth: Payer: Self-pay | Admitting: Family Medicine

## 2021-05-03 ENCOUNTER — Other Ambulatory Visit: Payer: Self-pay | Admitting: Family Medicine

## 2021-05-03 MED ORDER — HYDROCODONE-ACETAMINOPHEN 5-325 MG PO TABS: 1.0000 | ORAL_TABLET | Freq: Four times a day (QID) | ORAL | 0 refills | Status: DC | PRN

## 2021-05-03 NOTE — Telephone Encounter (Signed)
Received call from [facility] with [place]. Called to report patient has cancelled all of his PT appts. Patient reports leg and dialysis make PT too much.   Please advise at [phone].

## 2021-05-04 DIAGNOSIS — E1122 Type 2 diabetes mellitus with diabetic chronic kidney disease: Secondary | ICD-10-CM | POA: Diagnosis not present

## 2021-05-04 DIAGNOSIS — Z992 Dependence on renal dialysis: Secondary | ICD-10-CM | POA: Diagnosis not present

## 2021-05-04 DIAGNOSIS — N186 End stage renal disease: Secondary | ICD-10-CM | POA: Diagnosis not present

## 2021-05-07 DIAGNOSIS — N186 End stage renal disease: Secondary | ICD-10-CM | POA: Diagnosis not present

## 2021-05-07 DIAGNOSIS — E876 Hypokalemia: Secondary | ICD-10-CM | POA: Diagnosis not present

## 2021-05-07 DIAGNOSIS — R52 Pain, unspecified: Secondary | ICD-10-CM | POA: Diagnosis not present

## 2021-05-07 DIAGNOSIS — N2581 Secondary hyperparathyroidism of renal origin: Secondary | ICD-10-CM | POA: Diagnosis not present

## 2021-05-07 DIAGNOSIS — Z992 Dependence on renal dialysis: Secondary | ICD-10-CM | POA: Diagnosis not present

## 2021-05-07 DIAGNOSIS — D689 Coagulation defect, unspecified: Secondary | ICD-10-CM | POA: Diagnosis not present

## 2021-05-08 ENCOUNTER — Other Ambulatory Visit: Payer: Self-pay

## 2021-05-08 ENCOUNTER — Encounter: Payer: Self-pay | Admitting: Family Medicine

## 2021-05-08 ENCOUNTER — Ambulatory Visit (INDEPENDENT_AMBULATORY_CARE_PROVIDER_SITE_OTHER): Payer: PPO | Admitting: Family Medicine

## 2021-05-08 VITALS — BP 118/60 | HR 72 | Temp 99.0°F | Resp 16

## 2021-05-08 DIAGNOSIS — I4891 Unspecified atrial fibrillation: Secondary | ICD-10-CM | POA: Diagnosis not present

## 2021-05-08 DIAGNOSIS — I1311 Hypertensive heart and chronic kidney disease without heart failure, with stage 5 chronic kidney disease, or end stage renal disease: Secondary | ICD-10-CM | POA: Diagnosis not present

## 2021-05-08 DIAGNOSIS — M47816 Spondylosis without myelopathy or radiculopathy, lumbar region: Secondary | ICD-10-CM | POA: Diagnosis not present

## 2021-05-08 DIAGNOSIS — L97901 Non-pressure chronic ulcer of unspecified part of unspecified lower leg limited to breakdown of skin: Secondary | ICD-10-CM | POA: Diagnosis not present

## 2021-05-08 DIAGNOSIS — E1122 Type 2 diabetes mellitus with diabetic chronic kidney disease: Secondary | ICD-10-CM | POA: Diagnosis not present

## 2021-05-08 DIAGNOSIS — N186 End stage renal disease: Secondary | ICD-10-CM

## 2021-05-08 DIAGNOSIS — Z8546 Personal history of malignant neoplasm of prostate: Secondary | ICD-10-CM | POA: Diagnosis not present

## 2021-05-08 DIAGNOSIS — N390 Urinary tract infection, site not specified: Secondary | ICD-10-CM | POA: Diagnosis not present

## 2021-05-08 DIAGNOSIS — B9561 Methicillin susceptible Staphylococcus aureus infection as the cause of diseases classified elsewhere: Secondary | ICD-10-CM | POA: Diagnosis not present

## 2021-05-08 DIAGNOSIS — N179 Acute kidney failure, unspecified: Secondary | ICD-10-CM | POA: Diagnosis not present

## 2021-05-08 DIAGNOSIS — E1165 Type 2 diabetes mellitus with hyperglycemia: Secondary | ICD-10-CM | POA: Diagnosis not present

## 2021-05-08 DIAGNOSIS — R011 Cardiac murmur, unspecified: Secondary | ICD-10-CM | POA: Diagnosis not present

## 2021-05-08 DIAGNOSIS — Z23 Encounter for immunization: Secondary | ICD-10-CM | POA: Diagnosis not present

## 2021-05-08 DIAGNOSIS — L989 Disorder of the skin and subcutaneous tissue, unspecified: Secondary | ICD-10-CM | POA: Diagnosis not present

## 2021-05-08 DIAGNOSIS — E1142 Type 2 diabetes mellitus with diabetic polyneuropathy: Secondary | ICD-10-CM | POA: Diagnosis not present

## 2021-05-08 DIAGNOSIS — Z794 Long term (current) use of insulin: Secondary | ICD-10-CM | POA: Diagnosis not present

## 2021-05-08 DIAGNOSIS — Z8679 Personal history of other diseases of the circulatory system: Secondary | ICD-10-CM | POA: Diagnosis not present

## 2021-05-08 DIAGNOSIS — E113311 Type 2 diabetes mellitus with moderate nonproliferative diabetic retinopathy with macular edema, right eye: Secondary | ICD-10-CM | POA: Diagnosis not present

## 2021-05-08 DIAGNOSIS — B952 Enterococcus as the cause of diseases classified elsewhere: Secondary | ICD-10-CM | POA: Diagnosis not present

## 2021-05-08 DIAGNOSIS — K760 Fatty (change of) liver, not elsewhere classified: Secondary | ICD-10-CM | POA: Diagnosis not present

## 2021-05-08 DIAGNOSIS — H9191 Unspecified hearing loss, right ear: Secondary | ICD-10-CM | POA: Diagnosis not present

## 2021-05-08 DIAGNOSIS — E782 Mixed hyperlipidemia: Secondary | ICD-10-CM | POA: Diagnosis not present

## 2021-05-08 DIAGNOSIS — G8929 Other chronic pain: Secondary | ICD-10-CM | POA: Diagnosis not present

## 2021-05-08 DIAGNOSIS — Z7982 Long term (current) use of aspirin: Secondary | ICD-10-CM | POA: Diagnosis not present

## 2021-05-08 DIAGNOSIS — M1990 Unspecified osteoarthritis, unspecified site: Secondary | ICD-10-CM | POA: Diagnosis not present

## 2021-05-08 DIAGNOSIS — Z992 Dependence on renal dialysis: Secondary | ICD-10-CM | POA: Diagnosis not present

## 2021-05-08 DIAGNOSIS — K8689 Other specified diseases of pancreas: Secondary | ICD-10-CM | POA: Diagnosis not present

## 2021-05-08 DIAGNOSIS — I2510 Atherosclerotic heart disease of native coronary artery without angina pectoris: Secondary | ICD-10-CM | POA: Diagnosis not present

## 2021-05-08 MED ORDER — HYDROCODONE-ACETAMINOPHEN 5-325 MG PO TABS: 2.0000 | ORAL_TABLET | Freq: Four times a day (QID) | ORAL | 0 refills | Status: DC | PRN

## 2021-05-08 NOTE — Progress Notes (Signed)
Subjective:    Patient ID: Mark Hose., male    DOB: 07-Oct-1940, 80 y.o.   MRN: [id number]  HPI 04/06/21 Patient presents today with a very complex problem.  He has a very painful rash over his posterior left calf and left thigh.  The rash is a reddish violaceous linear patch.  It appears to be libido reticularis.  It extends all over to the posterior lateral left calf where there is a small black scab consistent with necrotic eschar that is approximately 1.5 cm in diameter.  The rash is tender to the touch.  There is no evidence of secondary infection.  Past medical history is significant for end-stage renal disease having just stopped dialysis.  Rash appears to be consistent with calciphylaxis.  I reviewed his hospital records.  His last phosphate level was 4.0 July 4 of this year.  His last PTH level was 298.  That was May 26 of this year his last calcium level was 7.1 with an albumin of 3.5 which corrects to a calcium level of 7.9.  As result he is on calcitriol in addition to calcium acetate as a phosphate binder.  At that time, my plan was: This presents a diagnostic challenge.  The patient has hypocalcemia even when corrected.  Therefore he is on a calcium containing phosphate binder/PhosLo in addition to calcitriol.  My understanding with calciphylaxis is we need to try to drive his phosphate level down to 3.5 if possible using non calcium containing phosphate binder such as Sevelamer.  We should also consider adding Cinacalcet to achieve a PTH level less than 300 and is close to 150 as possible.  We should avoid vitamin D analogs such as calcitriol however this would certainly complicate his hypocalcemia.  He is not on iron or warfarin.  He is not currently receiving dialysis.  Therefore I would like to run this by his nephrologist ([name]) as I am not sure how the changes that I am discussing above would affect his hypocalcemia.  Addendum: I was able to speak with his nephrologist,  [name].  I really appreciate her time, her insight, and her assistance.  She is going to work the patient in next week to take a look at the leg and see if this is in fact calciphylaxis.  Tentatively our plan will be to reduce the calcitriol to every other day and switch off the PhosLo.  They will also decide about Sensipar at that visit and discussed dialysis as well.  Again I appreciate her time and her help. 05/08/21 Patient is now on Monday Wednesday Friday dialysis.  He reports severe pain in both legs specifically in the left leg and areas of dry gangrene due to severe calciphylaxis.  There are 3 patches of necrotic eschar on his left leg.  Each is approximately 4 cm x 6 cm.  There is no surrounding area of cellulitis or drainage.  However they are extremely painful.  He is having to take 2 hydrocodone twice a day just to tolerate the pain.  The pain is worse when his legs are elevated.  If he can leave his legs dangling allowing gravity to improve blood flow the pain is better.  Patient is not certain if he is still taking a calcium containing phosphate binder.  PhosLo is still on his list.  I believe nephrology wanted to transition him to Sevelamer.  He is also not sure if he is on Cinacalcet.  He is also uncertain  if he is receiving sodium thiosulfate IV at dialysis.  He is scheduled to see general surgery tomorrow for a biopsy that was requested by nephrology to confirm calciphylaxis.  Wound care has refused to see the patient in [place] however he would benefit from wound care managing the necrotic eschar/dry gangrene on his left lower extremity. Past Medical History:  Diagnosis Date   Arthritis    lower back   Chronic back pain    Disc disease   Chronic kidney disease    Coronary atherosclerosis of native coronary artery    Coronary calcifications by chest CT, Myoview demonstrating inferolateral scar   Deafness in right ear    Diabetes mellitus    Diabetic retinopathy (HCC)     moderate nonproliferative with macular edema in right eye   Diastolic dysfunction 11/2874   Grade 1.   Essential hypertension, benign    Heart murmur    in past   HOH (hard of hearing)    Hyperkalemia    Kidney stones    Mixed hyperlipidemia    NAFLD (nonalcoholic fatty liver disease)    Pancreatic insufficiency    Diagnosed at [facility]   Prostate cancer [facility]) 2011   Shortness of breath dyspnea    occasional - liver pressing on right lung, decreased capacity   Subdural hematoma    Type 2 diabetes mellitus ([place])    Wears hearing aid    "crossover" form right to left   Past Surgical History:  Procedure Laterality Date   APPENDECTOMY     Bilateral hip replacement     CATARACT EXTRACTION W/PHACO Left 05/15/2015   Procedure: CATARACT EXTRACTION PHACO AND INTRAOCULAR LENS PLACEMENT ([place]);  Surgeon: Ronnell Freshwater, MD;  Location: [facility];  Service: Ophthalmology;  Laterality: Left;  DIABETIC - insulin pump and oral meds   CATARACT EXTRACTION W/PHACO Right 08/28/2015   Procedure: CATARACT EXTRACTION PHACO AND INTRAOCULAR LENS PLACEMENT ([facility]);  Surgeon: Ronnell Freshwater, MD;  Location: [facility];  Service: Ophthalmology;  Laterality: Right;  DIABETIC PER PT AND CINDY PLEASE KEEP ARRIVAL TIME AFTER 8AM    CHOLECYSTECTOMY     HERNIA REPAIR     INSERTION OF DIALYSIS CATHETER Right 01/22/2021   Procedure: INSERTION OF DIALYSIS CATHETER;  Surgeon: Virl Cagey, MD;  Location: [facility];  Service: General;  Laterality: Right;   INSERTION OF DIALYSIS CATHETER Right 04/16/2021   Procedure: INSERTION OF DIALYSIS CATHETER;  Surgeon: Virl Cagey, MD;  Location: [facility];  Service: General;  Laterality: Right;   PROSTATECTOMY  2011   Current Outpatient Medications on File Prior to Visit  Medication Sig Dispense Refill   aspirin EC 81 MG tablet Take 81 mg by mouth at bedtime.      ferric gluconate 125 mg in sodium chloride 0.9 % 100 mL Inject 125 mg into  the vein every Monday, Wednesday, and Friday with hemodialysis.     furosemide (LASIX) 40 MG tablet Take 40 mg by mouth daily.     insulin aspart (NOVOLOG) 100 UNIT/ML injection Inject 8 Units into the skin 3 (three) times daily with meals. (Patient taking differently: Inject 10 Units into the skin in the morning.) 10 mL 11   insulin glargine (LANTUS) 100 UNIT/ML injection Inject 0.12 mLs (12 Units total) into the skin at bedtime. 10 mL 11   lipase/protease/amylase (CREON) 12000-38000 units CPEP capsule Take 1 capsule (12,000 Units total) by mouth with breakfast, with lunch, and with evening meal. 270 capsule 3  metoprolol tartrate (LOPRESSOR) 50 MG tablet Take 1 tablet (50 mg total) by mouth 2 (two) times daily. 180 tablet 1   mupirocin ointment (BACTROBAN) 2 % Place 1 application into the nose 2 (two) times daily. 22 g 0   pravastatin (PRAVACHOL) 10 MG tablet TAKE 1 TABLET(10 MG) BY MOUTH DAILY WITH BREAKFAST (Patient taking differently: Take 10 mg by mouth daily.) 90 tablet 1   calcium acetate (PHOSLO) 667 MG capsule Take 2 capsules (1,334 mg total) by mouth 3 (three) times daily with meals. (Patient not taking: Reported on 05/08/2021) 180 capsule 0   No current facility-administered medications on file prior to visit.   Marland Kitchenall Social History   Socioeconomic History   Marital status: Married    Spouse name: Not on file   Number of children: Not on file   Years of education: Not on file   Highest education level: Not on file  Occupational History   Not on file  Tobacco Use   Smoking status: Never   Smokeless tobacco: Never  Substance and Sexual Activity   Alcohol use: No   Drug use: No   Sexual activity: Not on file  Other Topics Concern   Not on file  Social History Narrative   Not on file   Social Determinants of Health   Financial Resource Strain: Not on file  Food Insecurity: Not on file  Transportation Needs: Not on file  Physical Activity: Not on file  Stress: Not on  file  Social Connections: Not on file  Intimate Partner Violence: Not on file      Review of Systems  All other systems reviewed and are negative.     Objective:   Physical Exam Constitutional:      General: He is not in acute distress.    Appearance: Normal appearance. He is normal weight. He is not ill-appearing or toxic-appearing.  Cardiovascular:     Rate and Rhythm: Normal rate. Rhythm irregular.     Heart sounds: Normal heart sounds.  Pulmonary:     Effort: Pulmonary effort is normal.     Breath sounds: Rales present.  Abdominal:     General: Abdomen is flat. Bowel sounds are normal.     Palpations: Abdomen is soft.  Musculoskeletal:     Right lower leg: No edema.     Left lower leg: No edema.  Skin:    Findings: Erythema and rash present.       Neurological:     Mental Status: He is alert.        Assessment & Plan:  Need for immunization against influenza - Plan: Flu Vaccine QUAD High Dose(Fluad)  Calciphylaxis of lower extremity with nonhealing ulcer, limited to breakdown of skin ([place]) - Plan: Ambulatory referral to Wound Clinic, VAS [place] ABI WITH/WO TBI  ESRD (end stage renal disease) ([place]) - Plan: Ambulatory referral to Wound Clinic, VAS [place] ABI WITH/WO TBI  History of ASCVD - Plan: Ambulatory referral to [facility], VAS [place] ABI WITH/WO TBI  DM type 2 with diabetic peripheral neuropathy ([place]) - Plan: Ambulatory referral to [facility], VAS [place] ABI WITH/WO TBI  Patient has severe calciphylaxis.  He has necrotic eschar/dry gangrene on his left lower extremity.  There are 3 patches roughly 4 x 6 cm that are extremely painful.  There isno evidence of secondary infection.  He has an appointment scheduled by nephrology for him to meet with the general surgeons to perform a biopsy I assume to confirm calciphylaxis.  He has been refused by wound care.  Therefore I am going to consult a wound clinic in [place] to help manage these nonhealing wounds on his left leg.   I am also going to schedule the patient for vascular ultrasound to rule out any large vessel disease in the left lower extremity that may be contributing however I do feel that this is most likely calciphylaxis.  I would maximize his pain control.  I will refill his Norco 5/325.  He can take 1 to 2 tablets twice daily/120 pills a month for pain control.  He would benefit from being on Sevelamer rather than PhosLo.  I have asked the wife to go home and verify which phosphate binder he is taking.  I will also likely start the patient on Cinacalcet to suppress PTH in an effort to help prevent further calciphylaxis.  I do believe he would benefit from sodium thiosulfate to help manage the calciphylaxis as well and so I have asked him to verify with his nephrologist tomorrow at dialysis if he is receiving the sodium thiosulfate IV at dialysis.  If not, perhaps nephrology can schedule that for him.

## 2021-05-08 NOTE — Addendum Note (Signed)
Addended by: Sheral Flow on: 05/08/2021 05:39 PM   Modules accepted: Orders

## 2021-05-09 ENCOUNTER — Other Ambulatory Visit: Payer: Self-pay

## 2021-05-09 ENCOUNTER — Ambulatory Visit (INDEPENDENT_AMBULATORY_CARE_PROVIDER_SITE_OTHER): Payer: PPO

## 2021-05-09 ENCOUNTER — Ambulatory Visit (INDEPENDENT_AMBULATORY_CARE_PROVIDER_SITE_OTHER): Payer: PPO | Admitting: Vascular Surgery

## 2021-05-09 ENCOUNTER — Encounter: Payer: Self-pay | Admitting: Vascular Surgery

## 2021-05-09 VITALS — BP 118/66 | HR 82 | Temp 97.3°F | Resp 16 | Ht 68.0 in | Wt 170.0 lb

## 2021-05-09 DIAGNOSIS — N186 End stage renal disease: Secondary | ICD-10-CM

## 2021-05-09 NOTE — Progress Notes (Signed)
[facility]  Patient name: Mark HAZARD Sr. MRN: [id number] DOB: 12-01-1940 Sex: male  REASON FOR CONSULT: Discuss access for hemodialysis  Nephrologist: Coladonato  HPI: [name]. is a 80 y.o. male, who is here today for discussion of access for hemodialysis.  He is here today with his wife.  He has had a very difficult last several months [facility]  He had dialysis acutely and then was off for a while and is now been back on dialysis for approximately 3 weeks.  He has complaints mainly related to his lower extremities.  He had severe edema and breakdown on the skin on the lateral aspect of his left calf.  He has been diagnosed with calciphylaxis and is awaiting formal biopsy and wound management.  He has a right IJ tunneled hemodialysis catheter which she reports is working well.  [facility]  He does not have a pacemaker.  He is not on anticoagulant.  Past Medical History:  Diagnosis Date   Arthritis    lower back   Chronic back pain    Disc disease   Chronic kidney disease    Coronary atherosclerosis of native coronary artery    Coronary calcifications by chest CT, Myoview demonstrating inferolateral scar   Deafness in right ear    Diabetes mellitus    Diabetic retinopathy (HCC)    moderate nonproliferative with macular edema in right eye   Diastolic dysfunction 54/7096  Grade 1.   Essential hypertension, benign    Heart murmur    in past   HOH (hard of hearing)    Hyperkalemia    Kidney stones    Mixed hyperlipidemia    NAFLD (nonalcoholic fatty liver disease)    Pancreatic insufficiency    [facility]  Prostate cancer ([facility]   Shortness of breath dyspnea    occasional - liver pressing on right lung, decreased capacity   Subdural hematoma    Type 2 diabetes mellitus ([place]     Wears hearing aid    "crossover" form right to left    Family History  Problem Relation Age of Onset   Diabetes type II Mother    Heart attack Father     SOCIAL HISTORY: Social History   Socioeconomic History   Marital status: Married    Spouse name: Not on file   Number of children: Not on file   Years of education: Not on file   Highest education level: Not on file  Occupational History   Not on file  Tobacco Use   Smoking status: Never   Smokeless tobacco: Never  Substance and Sexual Activity   Alcohol use: No   Drug use: No   Sexual activity: Not on file  Other Topics Concern   Not on file  Social History Narrative   Not on file   Social Determinants of Health   Financial Resource Strain: Not on file  Food Insecurity: Not on file  Transportation Needs: Not on file  Physical Activity: Not on file  Stress: Not on file  Social Connections: Not on file  Intimate Partner Violence: Not on file    Allergies  Allergen Reactions   Enalapril Maleate Cough    Current Outpatient Medications  Medication Sig Dispense Refill   aspirin EC 81 MG tablet Take 81 mg by mouth at bedtime.  calcitRIOL (ROCALTROL) 0.25 MCG capsule Take 0.25 mcg by mouth daily.     diltiazem (CARDIZEM CD) 240 MG 24 hr capsule Take 240 mg by mouth daily.     ferric gluconate 125 mg in sodium chloride 0.9 % 100 mL Inject 125 mg into the vein every Monday, Wednesday, and Friday with hemodialysis.     furosemide (LASIX) 40 MG tablet Take 40 mg by mouth daily.     HYDROcodone-acetaminophen (NORCO) 5-325 MG tablet Take 2 tablets by mouth every 6 (six) hours as needed for moderate pain. 120 tablet 0   insulin aspart (NOVOLOG) 100 UNIT/ML injection Inject 8 Units into the skin 3 (three) times daily with meals. (Patient taking differently: Inject 10 Units into the skin in the morning.) 10 mL 11   insulin glargine (LANTUS) 100 UNIT/ML injection Inject 0.12 mLs (12 Units total) into the skin at bedtime.  10 mL 11   metoprolol tartrate (LOPRESSOR) 50 MG tablet Take 1 tablet (50 mg total) by mouth 2 (two) times daily. 180 tablet 1   Pancrelipase, Lip-Prot-Amyl, (ZENPEP) 10000-32000 units CPEP Take by mouth.     pravastatin (PRAVACHOL) 10 MG tablet TAKE 1 TABLET(10 MG) BY MOUTH DAILY WITH BREAKFAST (Patient taking differently: Take 10 mg by mouth daily.) 90 tablet 1   sevelamer carbonate (RENVELA) 800 MG tablet Take 1,600 mg by mouth 3 (three) times daily with meals.     sodium bicarbonate 650 MG tablet Take 650 mg by mouth daily.     No current facility-administered medications for this visit.    REVIEW OF SYSTEMS:  [X]  denotes positive finding, [ ]  denotes negative finding Cardiac  Comments:  Chest pain or chest pressure:    Shortness of breath upon exertion:    Short of breath when lying flat:    Irregular heart rhythm:        Vascular    Pain in calf, thigh, or hip brought on by ambulation:    Pain in feet at night that wakes you up from your sleep:  x   Blood clot in your veins:    Leg swelling:         Pulmonary    Oxygen at home:    Productive cough:     Wheezing:         Neurologic    Sudden weakness in arms or legs:     Sudden numbness in arms or legs:     Sudden onset of difficulty speaking or slurred speech:    Temporary loss of vision in one eye:     Problems with dizziness:         Gastrointestinal    Blood in stool:     Vomited blood:         Genitourinary    Burning when urinating:     Blood in urine:        Psychiatric    Major depression:         Hematologic    Bleeding problems:    Problems with blood clotting too easily:        Skin    Rashes or ulcers:        Constitutional    Fever or chills:      PHYSICAL EXAM: Vitals:   05/09/21 1028  BP: 118/66  Pulse: 82  Resp: 16  Temp: (!) 97.3 F (36.3 C)  TempSrc: Temporal  SpO2: 99%  Weight: 170 lb (77.1 kg)  Height: 5' 8"  (1.727 m)  GENERAL: The patient is a well-nourished male, in  no acute distress. The vital signs are documented above. CARDIOVASCULAR: 2+ brachial and 2+ radial pulses bilaterally.  Very small surface veins bilaterally.  I do not palpate pedal pulses.  He does have audible Doppler flow at the anterior tibial and posterior tibial level bilaterally PULMONARY: There is good air exchange  MUSCULOSKELETAL: There are no major deformities or cyanosis. NEUROLOGIC: No focal weakness or paresthesias are detected. SKIN: There are no ulcers or rashes noted. PSYCHIATRIC: The patient has a normal affect.  DATA:  Upper extremity noninvasive studies revealed normal arterial flow bilaterally.  He has very small cephalic veins bilaterally and moderate size basilic veins bilaterally  MEDICAL ISSUES: Had a long discussion with the patient and his wife.  I explained options of hemodialysis long-term with AV fistula and AV graft.  He does not appear to be a candidate for cephalic vein fistula.  His basilic vein is marginal.  I explained the advantages and disadvantages of catheter, AV fistula and AV graft.  I have recommended a left arm access and will reimage his basilic vein at the time of surgery.  In all likelihood he will have a left upper arm AV graft as his first access.  I did discuss the issues of graft thrombosis and the treatment if this occurs.  Also discussed potential for steal syndrome.  Regarding his lower extremities.  He does not appear to have any significant arterial insufficiency based on hand-held Doppler of his anterior tibial arteries.  If he continues to have difficulty with healing he could have formal noninvasive studies.  We will plan left arm AV graft versus fistula on 05/15/2021 as an outpatient at [facility]   Rosetta Posner, MD [facility] Vascular and Vein Specialists of [facility] [phone] Pager [phone]  Note: Portions of this report may have been transcribed using voice recognition software.  Every effort has been  made to ensure accuracy; however, inadvertent computerized transcription errors may still be present.

## 2021-05-09 NOTE — H&P (View-Only) (Signed)
Vascular and Vein Specialist of [place]  Patient name: [name]. MRN: [id number] DOB: June 14, 1941 Sex: male  REASON FOR CONSULT: Discuss access for hemodialysis  Nephrologist: Coladonato  HPI: [name]. is a 80 y.o. male, who is here today for discussion of access for hemodialysis.  He is here today with his wife.  He has had a very difficult last several [facility]  He had dialysis acutely and then was off for a while and is now been back on dialysis for approximately 3 weeks.  He has complaints mainly related to his lower extremities.  He had severe edema and breakdown on the skin on the lateral aspect of his left calf.  He has been diagnosed with calciphylaxis and is awaiting formal biopsy and wound management.  He has a right IJ tunneled hemodialysis catheter which she reports is working well.  [facility]  He does not have a pacemaker.  He is not on anticoagulant.  Past Medical History:  Diagnosis Date   Arthritis    lower back   Chronic back pain    Disc disease   Chronic kidney disease    Coronary atherosclerosis of native coronary artery    Coronary calcifications by chest CT, Myoview demonstrating inferolateral scar   Deafness in right ear    Diabetes mellitus    Diabetic retinopathy (HCC)    moderate nonproliferative with macular edema in right eye   Diastolic dysfunction 58/7681  Grade 1.   Essential hypertension, benign    Heart murmur    in past   HOH (hard of hearing)    Hyperkalemia    Kidney stones    Mixed hyperlipidemia    NAFLD (nonalcoholic fatty liver disease)    Pancreatic insufficiency    [facility] ([facility]   Shortness of breath dyspnea    occasional - liver pressing on right lung, decreased capacity   Subdural hematoma    [place]     Wears hearing aid    "crossover" form right to left    Family History  Problem Relation Age of Onset   Diabetes type II Mother    Heart attack Father     SOCIAL HISTORY: Social History   Socioeconomic History   Marital status: Married    Spouse name: Not on file   Number of children: Not on file   Years of education: Not on file   Highest education level: Not on file  Occupational History   Not on file  Tobacco Use   Smoking status: Never   Smokeless tobacco: Never  Substance and Sexual Activity   Alcohol use: No   Drug use: No   Sexual activity: Not on file  Other Topics Concern   Not on file  Social History Narrative   Not on file   Social Determinants of Health   Financial Resource Strain: Not on file  Food Insecurity: Not on file  Transportation Needs: Not on file  Physical Activity: Not on file  Stress: Not on file  Social Connections: Not on file  Intimate Partner Violence: Not on file    Allergies  Allergen Reactions   Enalapril Maleate Cough    Current Outpatient Medications  Medication Sig Dispense Refill   aspirin EC 81 MG tablet Take 81 mg by mouth at bedtime.  calcitRIOL (ROCALTROL) 0.25 MCG capsule Take 0.25 mcg by mouth daily.     diltiazem (CARDIZEM CD) 240 MG 24 hr capsule Take 240 mg by mouth daily.     ferric gluconate 125 mg in sodium chloride 0.9 % 100 mL Inject 125 mg into the vein every Monday, Wednesday, and Friday with hemodialysis.     furosemide (LASIX) 40 MG tablet Take 40 mg by mouth daily.     HYDROcodone-acetaminophen (NORCO) 5-325 MG tablet Take 2 tablets by mouth every 6 (six) hours as needed for moderate pain. 120 tablet 0   insulin aspart (NOVOLOG) 100 UNIT/ML injection Inject 8 Units into the skin 3 (three) times daily with meals. (Patient taking differently: Inject 10 Units into the skin in the morning.) 10 mL 11   insulin glargine (LANTUS) 100 UNIT/ML injection Inject 0.12 mLs (12 Units total) into the skin at bedtime.  10 mL 11   metoprolol tartrate (LOPRESSOR) 50 MG tablet Take 1 tablet (50 mg total) by mouth 2 (two) times daily. 180 tablet 1   Pancrelipase, Lip-Prot-Amyl, (ZENPEP) 10000-32000 units CPEP Take by mouth.     pravastatin (PRAVACHOL) 10 MG tablet TAKE 1 TABLET(10 MG) BY MOUTH DAILY WITH BREAKFAST (Patient taking differently: Take 10 mg by mouth daily.) 90 tablet 1   sevelamer carbonate (RENVELA) 800 MG tablet Take 1,600 mg by mouth 3 (three) times daily with meals.     sodium bicarbonate 650 MG tablet Take 650 mg by mouth daily.     No current facility-administered medications for this visit.    REVIEW OF SYSTEMS:  [X]  denotes positive finding, [ ]  denotes negative finding Cardiac  Comments:  Chest pain or chest pressure:    Shortness of breath upon exertion:    Short of breath when lying flat:    Irregular heart rhythm:        Vascular    Pain in calf, thigh, or hip brought on by ambulation:    Pain in feet at night that wakes you up from your sleep:  x   Blood clot in your veins:    Leg swelling:         Pulmonary    Oxygen at home:    Productive cough:     Wheezing:         Neurologic    Sudden weakness in arms or legs:     Sudden numbness in arms or legs:     Sudden onset of difficulty speaking or slurred speech:    Temporary loss of vision in one eye:     Problems with dizziness:         Gastrointestinal    Blood in stool:     Vomited blood:         Genitourinary    Burning when urinating:     Blood in urine:        Psychiatric    Major depression:         Hematologic    Bleeding problems:    Problems with blood clotting too easily:        Skin    Rashes or ulcers:        Constitutional    Fever or chills:      PHYSICAL EXAM: Vitals:   05/09/21 1028  BP: 118/66  Pulse: 82  Resp: 16  Temp: (!) 97.3 F (36.3 C)  TempSrc: Temporal  SpO2: 99%  Weight: 170 lb (77.1 kg)  Height: 5' 8"  (1.727 m)  GENERAL: The patient is a well-nourished male, in  no acute distress. The vital signs are documented above. CARDIOVASCULAR: 2+ brachial and 2+ radial pulses bilaterally.  Very small surface veins bilaterally.  I do not palpate pedal pulses.  He does have audible Doppler flow at the anterior tibial and posterior tibial level bilaterally PULMONARY: There is good air exchange  MUSCULOSKELETAL: There are no major deformities or cyanosis. NEUROLOGIC: No focal weakness or paresthesias are detected. SKIN: There are no ulcers or rashes noted. PSYCHIATRIC: The patient has a normal affect.  DATA:  Upper extremity noninvasive studies revealed normal arterial flow bilaterally.  He has very small cephalic veins bilaterally and moderate size basilic veins bilaterally  MEDICAL ISSUES: Had a long discussion with the patient and his wife.  I explained options of hemodialysis long-term with AV fistula and AV graft.  He does not appear to be a candidate for cephalic vein fistula.  His basilic vein is marginal.  I explained the advantages and disadvantages of catheter, AV fistula and AV graft.  I have recommended a left arm access and will reimage his basilic vein at the time of surgery.  In all likelihood he will have a left upper arm AV graft as his first access.  I did discuss the issues of graft thrombosis and the treatment if this occurs.  Also discussed potential for steal syndrome.  Regarding his lower extremities.  He does not appear to have any significant arterial insufficiency based on hand-held Doppler of his anterior tibial arteries.  If he continues to have difficulty with healing he could have formal noninvasive studies.  We will plan left arm AV graft versus fistula on 05/15/2021 as an outpatient at [facility]   Rosetta Posner, MD [facility] Vascular and Vein Specialists of [facility] [phone] Pager [phone]  Note: Portions of this report may have been transcribed using voice recognition software.  Every effort has been  made to ensure accuracy; however, inadvertent computerized transcription errors may still be present.

## 2021-05-10 ENCOUNTER — Other Ambulatory Visit: Payer: Self-pay

## 2021-05-10 ENCOUNTER — Encounter (HOSPITAL_COMMUNITY): Payer: Self-pay

## 2021-05-11 ENCOUNTER — Encounter (HOSPITAL_COMMUNITY)
Admission: RE | Admit: 2021-05-11 | Discharge: 2021-05-11 | Disposition: A | Discharge status: Home / Self Care | Payer: PPO | Source: Ambulatory Visit | Attending: Vascular Surgery | Admitting: Vascular Surgery

## 2021-05-14 DIAGNOSIS — N2581 Secondary hyperparathyroidism of renal origin: Secondary | ICD-10-CM | POA: Diagnosis not present

## 2021-05-14 DIAGNOSIS — E876 Hypokalemia: Secondary | ICD-10-CM | POA: Diagnosis not present

## 2021-05-14 DIAGNOSIS — N186 End stage renal disease: Secondary | ICD-10-CM | POA: Diagnosis not present

## 2021-05-14 DIAGNOSIS — Z992 Dependence on renal dialysis: Secondary | ICD-10-CM | POA: Diagnosis not present

## 2021-05-14 DIAGNOSIS — D689 Coagulation defect, unspecified: Secondary | ICD-10-CM | POA: Diagnosis not present

## 2021-05-14 DIAGNOSIS — R52 Pain, unspecified: Secondary | ICD-10-CM | POA: Diagnosis not present

## 2021-05-15 ENCOUNTER — Ambulatory Visit (HOSPITAL_COMMUNITY): Payer: PPO | Admitting: Anesthesiology

## 2021-05-15 ENCOUNTER — Encounter (HOSPITAL_COMMUNITY)
Admission: RE | Disposition: A | Discharge status: Home / Self Care | Payer: Self-pay | Source: Ambulatory Visit | Attending: Vascular Surgery

## 2021-05-15 ENCOUNTER — Ambulatory Visit (HOSPITAL_COMMUNITY)
Admission: RE | Admit: 2021-05-15 | Discharge: 2021-05-15 | Disposition: A | Discharge status: Home / Self Care | Payer: PPO | Source: Ambulatory Visit | Attending: Vascular Surgery | Admitting: Vascular Surgery

## 2021-05-15 ENCOUNTER — Encounter (HOSPITAL_COMMUNITY): Payer: Self-pay | Admitting: Vascular Surgery

## 2021-05-15 DIAGNOSIS — Z888 Allergy status to other drugs, medicaments and biological substances status: Secondary | ICD-10-CM | POA: Diagnosis not present

## 2021-05-15 DIAGNOSIS — Z992 Dependence on renal dialysis: Secondary | ICD-10-CM | POA: Insufficient documentation

## 2021-05-15 DIAGNOSIS — Z7982 Long term (current) use of aspirin: Secondary | ICD-10-CM | POA: Insufficient documentation

## 2021-05-15 DIAGNOSIS — N186 End stage renal disease: Secondary | ICD-10-CM | POA: Diagnosis not present

## 2021-05-15 DIAGNOSIS — Z794 Long term (current) use of insulin: Secondary | ICD-10-CM | POA: Diagnosis not present

## 2021-05-15 DIAGNOSIS — Z79899 Other long term (current) drug therapy: Secondary | ICD-10-CM | POA: Insufficient documentation

## 2021-05-15 DIAGNOSIS — Z833 Family history of diabetes mellitus: Secondary | ICD-10-CM | POA: Diagnosis not present

## 2021-05-15 DIAGNOSIS — E1122 Type 2 diabetes mellitus with diabetic chronic kidney disease: Secondary | ICD-10-CM | POA: Diagnosis not present

## 2021-05-15 DIAGNOSIS — I120 Hypertensive chronic kidney disease with stage 5 chronic kidney disease or end stage renal disease: Secondary | ICD-10-CM | POA: Diagnosis not present

## 2021-05-15 DIAGNOSIS — Z8249 Family history of ischemic heart disease and other diseases of the circulatory system: Secondary | ICD-10-CM | POA: Diagnosis not present

## 2021-05-15 HISTORY — PX: AV FISTULA PLACEMENT: SHX1204

## 2021-05-15 LAB — POCT I-STAT, CHEM 8
BUN: 39 mg/dL — ABNORMAL HIGH (ref 8–23)
Calcium, Ion: 0.87 mmol/L — CL (ref 1.15–1.40)
Chloride: 99 mmol/L (ref 98–111)
Creatinine, Ser: 3 mg/dL — ABNORMAL HIGH (ref 0.61–1.24)
Glucose, Bld: 160 mg/dL — ABNORMAL HIGH (ref 70–99)
HCT: 32 % — ABNORMAL LOW (ref 39.0–52.0)
Hemoglobin: 10.9 g/dL — ABNORMAL LOW (ref 13.0–17.0)
Potassium: 4.2 mmol/L (ref 3.5–5.1)
Sodium: 136 mmol/L (ref 135–145)
TCO2: 30 mmol/L (ref 22–32)

## 2021-05-15 LAB — GLUCOSE, CAPILLARY: Glucose-Capillary: 132 mg/dL — ABNORMAL HIGH (ref 70–99)

## 2021-05-15 SURGERY — INSERTION OF ARTERIOVENOUS (AV) GORE-TEX GRAFT ARM
Anesthesia: General | Laterality: Left

## 2021-05-15 MED ORDER — SODIUM CHLORIDE 0.9 % IV SOLN: INTRAVENOUS | Status: DC

## 2021-05-15 MED ORDER — ORAL CARE MOUTH RINSE: 15.0000 mL | Freq: Once | OROMUCOSAL | Status: AC

## 2021-05-15 MED ORDER — FENTANYL CITRATE (PF) 100 MCG/2ML IJ SOLN
INTRAMUSCULAR | Status: DC | PRN
  Administered 2021-05-15 (×2): 50 ug via INTRAVENOUS

## 2021-05-15 MED ORDER — CHLORHEXIDINE GLUCONATE 0.12 % MT SOLN
15.0000 mL | Freq: Once | OROMUCOSAL | Status: AC
  Administered 2021-05-15: 15 mL via OROMUCOSAL

## 2021-05-15 MED ORDER — CEFAZOLIN SODIUM-DEXTROSE 2-4 GM/100ML-% IV SOLN
2.0000 g | INTRAVENOUS | Status: AC
  Administered 2021-05-15: 2 g via INTRAVENOUS
  Filled 2021-05-15: qty 100

## 2021-05-15 MED ORDER — PROPOFOL 500 MG/50ML IV EMUL
INTRAVENOUS | Status: DC | PRN
  Administered 2021-05-15: 100 ug/kg/min via INTRAVENOUS

## 2021-05-15 MED ORDER — LIDOCAINE HCL (PF) 2 % IJ SOLN
INTRAMUSCULAR | Status: AC
  Filled 2021-05-15: qty 5

## 2021-05-15 MED ORDER — FENTANYL CITRATE (PF) 50 MCG/ML IJ SOLN
25.0000 ug | INTRAMUSCULAR | Status: AC | PRN
  Administered 2021-05-15: 25 ug via INTRAVENOUS

## 2021-05-15 MED ORDER — FENTANYL CITRATE (PF) 100 MCG/2ML IJ SOLN
INTRAMUSCULAR | Status: AC
  Filled 2021-05-15: qty 2

## 2021-05-15 MED ORDER — PROPOFOL 10 MG/ML IV BOLUS
INTRAVENOUS | Status: AC
  Filled 2021-05-15: qty 20

## 2021-05-15 MED ORDER — ONDANSETRON HCL 4 MG/2ML IJ SOLN: 4.0000 mg | Freq: Once | INTRAMUSCULAR | Status: DC | PRN

## 2021-05-15 MED ORDER — ONDANSETRON HCL 4 MG/2ML IJ SOLN
INTRAMUSCULAR | Status: AC
  Filled 2021-05-15: qty 2

## 2021-05-15 MED ORDER — HEPARIN SODIUM (PORCINE) 1000 UNIT/ML IJ SOLN
INTRAMUSCULAR | Status: AC
  Filled 2021-05-15: qty 6

## 2021-05-15 MED ORDER — PHENYLEPHRINE HCL (PRESSORS) 10 MG/ML IV SOLN
INTRAVENOUS | Status: AC
  Filled 2021-05-15: qty 2

## 2021-05-15 MED ORDER — LIDOCAINE-EPINEPHRINE 0.5 %-1:200000 IJ SOLN
INTRAMUSCULAR | Status: AC
  Filled 2021-05-15: qty 1

## 2021-05-15 MED ORDER — PROPOFOL 10 MG/ML IV BOLUS
INTRAVENOUS | Status: DC | PRN
  Administered 2021-05-15 (×2): 20 mg via INTRAVENOUS

## 2021-05-15 MED ORDER — HEPARIN 6000 UNIT IRRIGATION SOLUTION
Status: DC | PRN
  Administered 2021-05-15: 1

## 2021-05-15 MED ORDER — CHLORHEXIDINE GLUCONATE 4 % EX LIQD: 60.0000 mL | Freq: Once | CUTANEOUS | Status: DC

## 2021-05-15 MED ORDER — 0.9 % SODIUM CHLORIDE (POUR BTL) OPTIME
TOPICAL | Status: DC | PRN
  Administered 2021-05-15: 1000 mL

## 2021-05-15 MED ORDER — FENTANYL CITRATE PF 50 MCG/ML IJ SOSY
PREFILLED_SYRINGE | INTRAMUSCULAR | Status: AC
  Administered 2021-05-15: 25 ug via INTRAVENOUS
  Filled 2021-05-15: qty 1

## 2021-05-15 MED ORDER — LIDOCAINE-EPINEPHRINE 0.5 %-1:200000 IJ SOLN
INTRAMUSCULAR | Status: DC | PRN
  Administered 2021-05-15 (×2): 5 mL

## 2021-05-15 MED ORDER — FENTANYL CITRATE PF 50 MCG/ML IJ SOSY: 25.0000 ug | PREFILLED_SYRINGE | INTRAMUSCULAR | Status: DC | PRN

## 2021-05-15 SURGICAL SUPPLY — 42 items
ADH SKN CLS APL DERMABOND .7 (GAUZE/BANDAGES/DRESSINGS) ×1
ARMBAND PINK RESTRICT EXTREMIT (MISCELLANEOUS) ×2 IMPLANT
BAG HAMPER (MISCELLANEOUS) ×2 IMPLANT
CANNULA VESSEL 3MM 2 BLNT TIP (CANNULA) ×2 IMPLANT
CLIP LIGATING EXTRA MED SLVR (CLIP) ×2 IMPLANT
CLIP LIGATING EXTRA SM BLUE (MISCELLANEOUS) ×2 IMPLANT
COVER LIGHT HANDLE STERIS (MISCELLANEOUS) ×4 IMPLANT
COVER MAYO STAND XLG (MISCELLANEOUS) ×2 IMPLANT
DECANTER SPIKE VIAL GLASS SM (MISCELLANEOUS) ×2 IMPLANT
DERMABOND ADVANCED (GAUZE/BANDAGES/DRESSINGS) ×1
DERMABOND ADVANCED .7 DNX12 (GAUZE/BANDAGES/DRESSINGS) ×1 IMPLANT
ELECT REM PT RETURN 9FT ADLT (ELECTROSURGICAL) ×2 IMPLANT
ELECTRODE REM PT RTRN 9FT ADLT (ELECTROSURGICAL) ×1 IMPLANT
GAUZE SPONGE 4X4 12PLY STRL (GAUZE/BANDAGES/DRESSINGS) ×4 IMPLANT
GLOVE SS BIOGEL STRL SZ 7.5 (GLOVE) ×1 IMPLANT
GLOVE SUPERSENSE BIOGEL SZ 7.5 (GLOVE) ×1
GLOVE SURG UNDER POLY LF SZ7 (GLOVE) ×6 IMPLANT
GOWN STRL REUS W/TWL LRG LVL3 (GOWN DISPOSABLE) ×8 IMPLANT
GRAFT GORETEX STRT 4-7X45 (Vascular Products) ×2 IMPLANT
IV NS 500ML (IV SOLUTION) ×2
IV NS 500ML BAXH (IV SOLUTION) ×1 IMPLANT
KIT BLADEGUARD II DBL (SET/KITS/TRAYS/PACK) ×2 IMPLANT
KIT TURNOVER KIT A (KITS) ×2 IMPLANT
MANIFOLD NEPTUNE II (INSTRUMENTS) ×2 IMPLANT
MARKER SKIN DUAL TIP RULER LAB (MISCELLANEOUS) ×4 IMPLANT
NEEDLE HYPO 18GX1.5 BLUNT FILL (NEEDLE) ×4 IMPLANT
NS IRRIG 1000ML POUR BTL (IV SOLUTION) ×2 IMPLANT
PACK CV ACCESS (CUSTOM PROCEDURE TRAY) ×2 IMPLANT
PAD ARMBOARD 7.5X6 YLW CONV (MISCELLANEOUS) ×2 IMPLANT
SET BASIN LINEN APH (SET/KITS/TRAYS/PACK) ×2 IMPLANT
SOL PREP POV-IOD 4OZ 10% (MISCELLANEOUS) ×2 IMPLANT
SOL PREP PROV IODINE SCRUB 4OZ (MISCELLANEOUS) ×2 IMPLANT
SPONGE T-LAP 18X18 ~~LOC~~+RFID (SPONGE) ×2 IMPLANT
SUT PROLENE 6 0 CC (SUTURE) ×4 IMPLANT
SUT SILK 2 0 PERMA HAND 18 BK (SUTURE) ×2 IMPLANT
SUT VIC AB 3-0 SH 27 (SUTURE) ×2
SUT VIC AB 3-0 SH 27X BRD (SUTURE) ×1 IMPLANT
SYR 10ML LL (SYRINGE) ×2 IMPLANT
SYR BULB IRRIG 60ML STRL (SYRINGE) ×2 IMPLANT
SYR CONTROL 10ML LL (SYRINGE) ×2 IMPLANT
SYR TOOMEY 50ML (SYRINGE) ×2 IMPLANT
UNDERPAD 30X36 HEAVY ABSORB (UNDERPADS AND DIAPERS) ×2 IMPLANT

## 2021-05-15 NOTE — Op Note (Signed)
    OPERATIVE REPORT  DATE OF SURGERY: 05/15/2021  PATIENT: Mark Jumbo Sr., 80 y.o. male MRN: [id number]  DOB: 28-Jul-1941  PRE-OPERATIVE DIAGNOSIS: End-stage renal disease  POST-OPERATIVE DIAGNOSIS:  Same  PROCEDURE: Left upper arm AV Gore-Tex graft  SURGEON:  Curt Jews, M.D.  PHYSICIAN ASSISTANT: Vevelyn Royals, RN  The assistant was needed for exposure and to expedite the case  ANESTHESIA: Local with sedation  EBL: per anesthesia record  Total I/O In: 300 [I.V.:200; IV Piggyback:100] Out: -   BLOOD ADMINISTERED: none  DRAINS: none  SPECIMEN: none  COUNTS CORRECT:  YES  PATIENT DISPOSITION:  PACU - hemodynamically stable  PROCEDURE DETAILS: The patient was taken to the room placed supine position where the area of the left arm left axilla were prepped and draped you sterile fashion.  Patient had extensive calcification visualized on his SonoSite.  He had a very small cephalic and basilic vein.  Decision was made to proceed with Gore-Tex graft.  Using local anesthesia, an incision was made over the antecubital space and carried down to isolate the brachial artery which was of large caliber but was heavily calcified.  This was not circumferential.  A separate incision was made using local anesthesia at the axilla and the axillary vein was mobilized and was of large caliber.  A tunnel was created between the level of the antecubital incision and the axillary incision.  A 4 x 7 mm Gore-Tex graft was brought through the tunnel.  The brachial artery was occluded proximally distally and was opened with an 11 blade and sent longstanding with Potts scissors.  A small arteriotomy was created to reduce risk of steal.  The 4 mm portion of the graft was spatulated and sewn end-to-side to the artery with a running 6-0 Prolene suture.  Clamps were removed from the artery and placed on the graft.  The graft was flushed with heparinized saline and reoccluded.  Next the axillary vein was  occluded proximally distally and was opened 11 blade Celestia any with Potts scissors.  The 7 mm portion of the graft cut to the appropriate dimension and was sewn end-to-side to the artery with a running 6-0 Prolene suture.  Clamps were removed and excellent thrill was noted.  The wounds irrigated with saline.  Hemostasis attained after cautery.  The wounds were closed with 3-0 Vicryl in the subcutaneous and subcuticular tissue.  The patient maintained a left radial pulse.   Rosetta Posner, M.D., [facility] 05/15/2021 12:25 PM  Note: Portions of this report may have been transcribed using voice recognition software.  Every effort has been made to ensure accuracy; however, inadvertent computerized transcription errors may still be present.

## 2021-05-15 NOTE — Anesthesia Preprocedure Evaluation (Signed)
Anesthesia Evaluation  Patient identified by MRN, date of birth, ID band Patient awake    Reviewed: Allergy & Precautions, NPO status , Patient's Chart, lab work & pertinent test results, reviewed documented beta blocker date and time   History of Anesthesia Complications Negative for: history of anesthetic complications  Airway Mallampati: II  TM Distance: <3 FB Neck ROM: Full    Dental  (+) Caps, Dental Advisory Given   Pulmonary shortness of breath and with exertion,    Pulmonary exam normal breath sounds clear to auscultation       Cardiovascular Exercise Tolerance: Poor hypertension, Pt. on home beta blockers and Pt. on medications + CAD  Normal cardiovascular exam+ dysrhythmias Supra Ventricular Tachycardia + Valvular Problems/Murmurs  Rhythm:Regular Rate:Normal  23-Dec-2020 11:58:44 [facility]-ER ROUTINE RECORD 1941-04-10 (51 yr) Male Caucasian Vent. rate 79 BPM PR interval 217 ms QRS duration 136 ms QT/QTcB 429/492 ms P-R-T axes 16 -27 19 Sinus rhythm Borderline prolonged PR interval Right bundle branch block Confirmed by Nanda Quinton [phone]) on 12/23/2020 12:41:51 PM  1. Left ventricular ejection fraction, by estimation, is 60 to 65%. The left ventricle has normal function. The left ventricle has no regional wall motion abnormalities. There is mild left ventricular hypertrophy. Left ventricular diastolic parameters are consistent with Grade I diastolic dysfunction (impaired relaxation).  2. Right ventricular systolic function is normal. The right ventricular size is normal. There is normal pulmonary artery systolic pressure.  3. The mitral valve is normal in structure. No evidence of mitral valve regurgitation. No evidence of mitral stenosis.  4. The aortic valve is tricuspid. Aortic valve regurgitation is not  visualized. Mild aortic valve sclerosis is present, with no evidence of aortic valve  stenosis.  5. Aortic dilatation noted. There is mild dilatation of the aortic root, measuring 39 mm.  6. The inferior vena cava is normal in size with greater than 50% respiratory variability, suggesting right atrial pressure of 3 mmHg.     Neuro/Psych Subdural hematoma  Neuromuscular disease negative psych ROS   GI/Hepatic negative GI ROS, (+) Cirrhosis  (NAFLD)      ,   Endo/Other  diabetes, Well Controlled, Type 2, Insulin Dependent  Renal/GU ESRF and DialysisRenal disease   Prostate cancer    Musculoskeletal  (+) Arthritis , Osteoarthritis,    Abdominal   Peds  Hematology negative hematology ROS (+)   Anesthesia Other Findings Shingles, H/o nose bleeds Left leg ulcers  Reproductive/Obstetrics negative OB ROS                             Anesthesia Physical  Anesthesia Plan  ASA: 3  Anesthesia Plan: General   Post-op Pain Management:    Induction: Intravenous  PONV Risk Score and Plan: Ondansetron and Propofol infusion  Airway Management Planned: Nasal Cannula, Natural Airway and Simple Face Mask  Additional Equipment:   Intra-op Plan:   Post-operative Plan:   Informed Consent: I have reviewed the patients History and Physical, chart, labs and discussed the procedure including the risks, benefits and alternatives for the proposed anesthesia with the patient or authorized representative who has indicated his/her understanding and acceptance.     Dental advisory given  Plan Discussed with: CRNA and Surgeon  Anesthesia Plan Comments:         Anesthesia Quick Evaluation

## 2021-05-15 NOTE — Anesthesia Procedure Notes (Signed)
Procedure Name: MAC Date/Time: 05/15/2021 11:04 AM Performed by: Lieutenant Diego, CRNA Pre-anesthesia Checklist: Patient identified, Emergency Drugs available, Suction available, Patient being monitored and Timeout performed Patient Re-evaluated:Patient Re-evaluated prior to induction Oxygen Delivery Method: Nasal cannula and Non-rebreather mask Preoxygenation: Pre-oxygenation with 100% oxygen Induction Type: IV induction

## 2021-05-15 NOTE — Transfer of Care (Signed)
Immediate Anesthesia Transfer of Care Note  Patient: Mark Jumbo Sr.  Procedure(s) Performed: INSERTION OF LEFT ARM ARTERIOVENOUS (AV) GORE-TEX GRAFT (Left)  Patient Location: PACU  Anesthesia Type:MAC  Level of Consciousness: awake  Airway & Oxygen Therapy: Patient Spontanous Breathing and Patient connected to nasal cannula oxygen  Post-op Assessment: Report given to RN and Post -op Vital signs reviewed and stable  Post vital signs: Reviewed and stable  Last Vitals:  Vitals Value Taken Time  BP    Temp    Pulse    Resp    SpO2      Last Pain:  Vitals:   05/15/21 0946  TempSrc:   PainSc: 4       Patients Stated Pain Goal: 7 (03/83/33 8329)  Complications: No notable events documented.

## 2021-05-15 NOTE — Discharge Instructions (Signed)
[facility]  Discharge Instructions  AV Fistula or Graft Surgery for Dialysis Access  Please refer to the following instructions for your post-procedure care. Your surgeon or physician assistant will discuss any changes with you.  Activity  You may drive the day following your surgery, if you are comfortable and no longer taking prescription pain medication. Resume full activity as the soreness in your incision resolves.  Bathing/Showering  You may shower after you go home. Keep your incision dry for 48 hours. Do not soak in a bathtub, hot tub, or swim until the incision heals completely. You may not shower if you have a hemodialysis catheter.  Incision Care  Clean your incision with mild soap and water after 48 hours. Pat the area dry with a clean towel. You do not need a bandage unless otherwise instructed. Do not apply any ointments or creams to your incision. You may have skin glue on your incision. Do not peel it off. It will come off on its own in about one week. Your arm may swell a bit after surgery. To reduce swelling use pillows to elevate your arm so it is above your heart. Your doctor will tell you if you need to lightly wrap your arm with an ACE bandage.  Diet  Resume your normal diet. There are not special food restrictions following this procedure. In order to heal from your surgery, it is CRITICAL to get adequate nutrition. Your body requires vitamins, minerals, and protein. Vegetables are the best source of vitamins and minerals. Vegetables also provide the perfect balance of protein. Processed food has little nutritional value, so try to avoid this.  Medications  Resume taking all of your medications. If your incision is causing pain, you may take over-the counter pain relievers such as acetaminophen (Tylenol). If you were prescribed a stronger pain medication, please be aware these medications can cause nausea and constipation. Prevent  nausea by taking the medication with a snack or meal. Avoid constipation by drinking plenty of fluids and eating foods with high amount of fiber, such as fruits, vegetables, and grains.  Do not take Tylenol if you are taking prescription pain medications.  Follow up Your surgeon may want to see you in the office following your access surgery. If so, this will be arranged at the time of your surgery.  Please call us immediately for any of the following conditions:  Increased pain, redness, drainage (pus) from your incision site Fever of 101 degrees or higher Severe or worsening pain at your incision site Hand pain or numbness.  Reduce your risk of vascular disease:  Stop smoking. If you would like help, call [facility] at 1-800-QUIT-NOW 820-493-3524) or [place] at [place] your cholesterol Maintain a desired weight Control your diabetes Keep your blood pressure down  Dialysis  It will take several weeks to several months for your new dialysis access to be ready for use. Your surgeon will determine when it is okay to use it. Your nephrologist will continue to direct your dialysis. You can continue to use your Permcath until your new access is ready for use.   05/15/2021 Mark BURESH Sr. [id number] 03/17/1941  Surgeon(s): Marchello Rothgeb, Arvilla Meres, MD  Procedure(s): INSERTION OF LEFT ARM ARTERIOVENOUS (AV) GORE-TEX GRAFT   May stick graft immediately   May stick graft on designated area only:    Do not stick graft for 4 weeks    If you have any questions, please call the  office at [phone].

## 2021-05-15 NOTE — Interval H&P Note (Signed)
History and Physical Interval Note:  05/15/2021 10:24 AM  Mark L Machnik Sr.  has presented today for surgery, with the diagnosis of ESRD.  The various methods of treatment have been discussed with the patient and family. After consideration of risks, benefits and other options for treatment, the patient has consented to  Procedure(s): INSERTION OF LEFT ARM ARTERIOVENOUS (AV) GORE-TEX GRAFT VERSUS FISTULA (Left) as a surgical intervention.  The patient's history has been reviewed, patient examined, no change in status, stable for surgery.  I have reviewed the patient's chart and labs.  Questions were answered to the patient's satisfaction.     Curt Jews

## 2021-05-15 NOTE — Anesthesia Postprocedure Evaluation (Signed)
Anesthesia Post Note  Patient: Mark Jumbo Sr.  Procedure(s) Performed: INSERTION OF LEFT ARM ARTERIOVENOUS (AV) GORE-TEX GRAFT (Left)  Patient location during evaluation: PACU Anesthesia Type: General Level of consciousness: awake and alert and oriented Pain management: pain level controlled Vital Signs Assessment: post-procedure vital signs reviewed and stable Respiratory status: spontaneous breathing and respiratory function stable Cardiovascular status: blood pressure returned to baseline and stable Postop Assessment: no apparent nausea or vomiting Anesthetic complications: no   No notable events documented.   Last Vitals:  Vitals:   05/15/21 1245 05/15/21 1259  BP: 131/63 132/79  Pulse: 81 76  Resp: 13 16  Temp: 36.6 C 36.6 C  SpO2: 98% 98%    Last Pain:  Vitals:   05/15/21 1259  TempSrc: Oral  PainSc: 0-No pain                 Wilson Sample C Almadelia Looman

## 2021-05-16 ENCOUNTER — Encounter (HOSPITAL_COMMUNITY): Payer: Self-pay | Admitting: Vascular Surgery

## 2021-05-17 ENCOUNTER — Ambulatory Visit: Payer: PPO | Admitting: General Surgery

## 2021-05-17 ENCOUNTER — Telehealth: Payer: Self-pay | Admitting: Family Medicine

## 2021-05-17 NOTE — Telephone Encounter (Signed)
Received call from [facility] from [facility] in [place] to follow up; referral still hasn't been received. Referral originally sent to [facility]; [facility] not partnered with [facility]. Requesting referral be made out for [place] office and not [place] office. Please fax to [phone]  Please advise at [phone]

## 2021-05-17 NOTE — Telephone Encounter (Signed)
Please re-send referral to [facility].

## 2021-05-19 DIAGNOSIS — R110 Nausea: Secondary | ICD-10-CM | POA: Insufficient documentation

## 2021-05-21 DIAGNOSIS — N186 End stage renal disease: Secondary | ICD-10-CM | POA: Diagnosis not present

## 2021-05-21 DIAGNOSIS — D631 Anemia in chronic kidney disease: Secondary | ICD-10-CM | POA: Diagnosis not present

## 2021-05-21 DIAGNOSIS — E039 Hypothyroidism, unspecified: Secondary | ICD-10-CM | POA: Diagnosis not present

## 2021-05-21 DIAGNOSIS — N2581 Secondary hyperparathyroidism of renal origin: Secondary | ICD-10-CM | POA: Diagnosis not present

## 2021-05-21 DIAGNOSIS — Z992 Dependence on renal dialysis: Secondary | ICD-10-CM | POA: Diagnosis not present

## 2021-05-21 DIAGNOSIS — E1122 Type 2 diabetes mellitus with diabetic chronic kidney disease: Secondary | ICD-10-CM | POA: Diagnosis not present

## 2021-05-21 DIAGNOSIS — D689 Coagulation defect, unspecified: Secondary | ICD-10-CM | POA: Diagnosis not present

## 2021-05-22 ENCOUNTER — Encounter: Payer: Self-pay | Admitting: Family Medicine

## 2021-05-23 ENCOUNTER — Encounter: Payer: Self-pay | Admitting: Family Medicine

## 2021-05-24 ENCOUNTER — Encounter: Payer: PPO | Attending: Physician Assistant | Admitting: Physician Assistant

## 2021-05-24 ENCOUNTER — Other Ambulatory Visit: Payer: Self-pay | Admitting: Family Medicine

## 2021-05-24 ENCOUNTER — Encounter: Payer: Self-pay | Admitting: Family Medicine

## 2021-05-24 ENCOUNTER — Other Ambulatory Visit: Payer: Self-pay

## 2021-05-24 DIAGNOSIS — L97822 Non-pressure chronic ulcer of other part of left lower leg with fat layer exposed: Secondary | ICD-10-CM | POA: Insufficient documentation

## 2021-05-24 DIAGNOSIS — N184 Chronic kidney disease, stage 4 (severe): Secondary | ICD-10-CM | POA: Insufficient documentation

## 2021-05-24 DIAGNOSIS — Z992 Dependence on renal dialysis: Secondary | ICD-10-CM | POA: Diagnosis not present

## 2021-05-24 DIAGNOSIS — E1122 Type 2 diabetes mellitus with diabetic chronic kidney disease: Secondary | ICD-10-CM | POA: Insufficient documentation

## 2021-05-24 DIAGNOSIS — L97222 Non-pressure chronic ulcer of left calf with fat layer exposed: Secondary | ICD-10-CM | POA: Diagnosis not present

## 2021-05-24 MED ORDER — SEVELAMER CARBONATE 800 MG PO TABS: 1600.0000 mg | ORAL_TABLET | Freq: Three times a day (TID) | ORAL | 11 refills | Status: DC

## 2021-05-25 ENCOUNTER — Ambulatory Visit: Payer: PPO | Admitting: Student

## 2021-05-25 ENCOUNTER — Encounter: Payer: Self-pay | Admitting: Family Medicine

## 2021-05-25 DIAGNOSIS — N454 Abscess of epididymis or testis: Secondary | ICD-10-CM | POA: Diagnosis not present

## 2021-05-25 NOTE — Progress Notes (Signed)
[name], [name] ([id number]) Visit Report for 05/24/2021 Chief Complaint Document Details Patient Name: [name], [name]. Date of Service: 05/24/2021 8:45 AM Medical Record Number: [id number] Patient Account Number: [id number] Date of Birth/Sex: 05/26/1941 (80 y.o. M) Treating RN: Carlene Coria Primary Care Provider: Jenna Luo Other Clinician: Referring Provider: Jenna Luo Treating Provider/Extender: Skipper Cliche in Treatment: 0 Information Obtained from: Patient Chief Complaint Left LE Calciphylaxis Electronic Signature(s) Signed: 05/24/2021 3:36:17 PM By: Worthy Keeler PA-C Entered By: Worthy Keeler on 05/24/2021 15:36:17 [name], [name]. ([id number]) -------------------------------------------------------------------------------- Debridement Details Patient Name: [name]. Date of Service: 05/24/2021 8:45 AM Medical Record Number: [id number] Patient Account Number: [id number] Date of Birth/Sex: 12-17-1940 (80 y.o. M) Treating RN: Carlene Coria Primary Care Provider: Jenna Luo Other Clinician: Referring Provider: Jenna Luo Treating Provider/Extender: Skipper Cliche in Treatment: 0 Debridement Performed for Wound #1 Left,Posterior Lower Leg Assessment: Performed By: Physician Tommie Sams., PA-C Debridement Type: Debridement Level of Consciousness (Pre- Awake and Alert procedure): Pre-procedure Verification/Time Out Yes - 09:45 Taken: Start Time: 09:45 Total Area Debrided ([name] x W): 10 (cm) x 5 (cm) = 50 (cm) Tissue and other material Viable, Non-Viable, Eschar, Subcutaneous, Skin: Dermis , Skin: Epidermis debrided: Level: Skin/Subcutaneous Tissue Debridement Description: Excisional Instrument: Scissors Bleeding: Minimum Hemostasis Achieved: Pressure End Time: 10:00 Procedural Pain: 4 Post Procedural Pain: 1 Response to Treatment: Procedure was tolerated well Level of Consciousness (Post- Awake and  Alert procedure): Post Debridement Measurements of Total Wound Length: (cm) 20 Width: (cm) 11 Depth: (cm) 0.4 Volume: (cm) 69.115 Character of Wound/Ulcer Post Debridement: Improved Post Procedure Diagnosis Same as Pre-procedure Electronic Signature(s) Signed: 05/25/2021 4:19:44 PM By: Carlene Coria RN Signed: 05/25/2021 4:28:58 PM By: Worthy Keeler PA-C Entered By: Carlene Coria on 05/24/2021 10:01:29 [name], [name] ([id number]) -------------------------------------------------------------------------------- HPI Details Patient Name: [name]. Date of Service: 05/24/2021 8:45 AM Medical Record Number: [id number] Patient Account Number: [id number] Date of Birth/Sex: [birth year] (80 y.o. 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 This has been present for a couple of months currently he is on the sodium thiosulfate at dialysis. He tells me that they picked up on this quickly and have been managing it but he really has not had any help with anything else from the standpoint of progressing the wound itself. The patient does have again end-stage renal disease which currently is listed as stage IV but he is on renal dialysis. He also has coronary artery disease as well as going shortly for formal arterial studies although he did have a quick test of the tibial artery with his vascular doctor when he went in to check on his fistula for his arm and subsequently they did not feel like he had any hemodynamically unstable flow into the lower extremity this is good news. Nonetheless I think that the biggest issue here  is going to be getting some of the eschar off so that we get the wounds med headed in the appropriate direction. Electronic Signature(s) Signed: 05/25/2021 8:00:30 AM By: Worthy Keeler PA-C Entered By: Worthy Keeler on 05/25/2021 08:00:30 [name], [name] ([id number]) -------------------------------------------------------------------------------- Physical Exam Details Patient Name: [name], [name]. Date of Service: 05/24/2021 8:45 AM  Medical Record Number: [id number] Patient Account Number: [id number] Date of Birth/Sex: August 15, 1940 (80 y.o. M) Treating RN: Carlene Coria Primary Care Provider: Jenna Luo Other Clinician: Referring Provider: Jenna Luo Treating Provider/Extender: Skipper Cliche in Treatment: 0 Constitutional patient is hypertensive.. pulse regular and within target range for patient.Marland Kitchen respirations regular, non-labored and within target range for patient.Marland Kitchen temperature within target range for patient.. Well-nourished and well-hydrated in no acute distress. Eyes conjunctiva clear no eyelid edema noted. pupils equal round and reactive to light and accommodation. Ears, Nose, Mouth, and Throat no gross abnormality of ear auricles or external auditory canals. normal hearing noted during conversation. mucus membranes moist. Respiratory normal breathing without difficulty. Cardiovascular 2+ dorsalis pedis/posterior tibialis pulses. no clubbing, cyanosis, significant edema, <3 sec cap refill. Musculoskeletal normal gait and posture. no significant deformity or arthritic changes, no loss or range of motion, no clubbing. Psychiatric this patient is able to make decisions and demonstrates good insight into disease process. Alert and Oriented x 3. pleasant and cooperative. Notes Upon evaluation patient's wound bed actually showed signs of significant eschar covering the surface of the wounds unfortunately. With that being said I do believe that the patient would  benefit from clearing some of this away carefully again I do not recommend any aggressive sharp debridement when it comes to calciphylaxis but I think a superficial and careful debridement to remove the eschar and then allow something like Dakin's solution to clean up the surface of the wounds can be highly beneficial in these patients have seen at time and again with similar patients that have treated. Therefore that was proceeded with today I did remove the majority the eschar and he seems to be doing quite well. Electronic Signature(s) Signed: 05/25/2021 8:01:17 AM By: Worthy Keeler PA-C Entered By: Worthy Keeler on 05/25/2021 08:01:16 [name], [name] ([id number]) -------------------------------------------------------------------------------- Physician Orders Details Patient Name: [name]. Date of Service: 05/24/2021 8:45 AM Medical Record Number: [id number] Patient Account Number: [id number] Date of Birth/Sex: 1941-02-06 (80 y.o. M) Treating RN: Carlene Coria Primary Care Provider: Jenna Luo Other Clinician: Referring Provider: Jenna Luo Treating Provider/Extender: Skipper Cliche in Treatment: 0 Verbal / Phone Orders: No Diagnosis Coding ICD-10 Coding Code Description E83.50 Unspecified disorder of calcium metabolism L97.822 Non-pressure chronic ulcer of other part of left lower leg with fat layer exposed Z99.2 Dependence on renal dialysis N18.4 Chronic kidney disease, stage 4 (severe) I25.10 Atherosclerotic heart disease of native coronary artery without angina pectoris Follow-up Appointments o Return Appointment in 1 week. Edema Control - Lymphedema / Segmental Compressive Device / Other o Elevate, Exercise Daily and Avoid Standing for Long Periods of Time. o Elevate legs to the level of the heart and pump ankles as often as possible o Elevate leg(s) parallel to the floor when sitting. Wound Treatment Wound #1 - Lower Leg Wound Laterality:  Left, Posterior Primary Dressing: Kerlix AMD Roll Dressing, 4.5x 4.1 (in/in) (DME) (Generic) 1 x Per Day/30 Days Discharge Instructions: moisten with normal saline Secondary Dressing: Kerlix 4.5 x 4.1 (in/yd) (DME) (Generic) 1 x Per Day/30 Days Discharge Instructions: Apply Kerlix 4.5 x 4.1 (in/yd) as instructed Secured With: 20M Medipore H Soft Cloth Surgical Tape, 2x2 (in/yd) (DME) (Generic) 1 x Per Day/30 Days Patient Medications Allergies: enalapril Notifications Medication Indication Start End Dakin's Solution 05/24/2021 DOSE miscellaneous 0.25 % solution - Moisten gauze with Dakin's solution then wring out leaving gauze damp not saturated before packing daily into the wound as directed in clini Electronic Signature(s) Signed: 05/24/2021  3:37:09 PM By: Worthy Keeler PA-C Entered By: Worthy Keeler on 05/24/2021 15:37:09 [name], Mark LMarland Kitchen ([id number]) -------------------------------------------------------------------------------- Problem List Details Patient Name: College, Sabir [name]. Date of Service: 05/24/2021 8:45 AM Medical Record Number: [id number] Patient Account Number: [id number] Date of Birth/Sex: [birth year] (80 y.o. M) Treating RN: Carlene Coria Primary Care Provider: Jenna Luo Other Clinician: Referring Provider: Jenna Luo Treating Provider/Extender: Skipper Cliche in Treatment: 0 Active Problems ICD-10 Encounter Code Description Active Date MDM Diagnosis E83.50 Unspecified disorder of calcium metabolism 05/24/2021 No Yes L97.822 Non-pressure chronic ulcer of other part of left lower leg with fat layer 05/24/2021 No Yes exposed Z99.2 Dependence on renal dialysis 05/24/2021 No Yes N18.4 Chronic kidney disease, stage 4 (severe) 05/24/2021 No Yes I25.10 Atherosclerotic heart disease of native coronary artery without angina 05/24/2021 No Yes pectoris Inactive Problems Resolved Problems Electronic Signature(s) Signed: 05/24/2021 9:38:58 AM By: Worthy Keeler PA-C Entered By: Worthy Keeler on 05/24/2021 09:38:58 Gowan, Ryett [name]. ([id number]) -------------------------------------------------------------------------------- Progress Note Details Patient Name: [name], [name]. Date of Service: 05/24/2021 8:45 AM Medical Record Number: [id number] Patient Account Number: [id number] Date of Birth/Sex: April 06, 1941 (80 y.o. 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 This has been present for a couple of months currently he is on the sodium thiosulfate at dialysis. He tells me that they picked up on this quickly and have been managing it but he really has not had any help with anything else from the standpoint of progressing the wound itself. The patient does have again end-stage renal disease which currently is listed as stage IV but he is on renal dialysis. He also has coronary artery disease as well as going shortly for formal arterial studies although he did have a quick test of the tibial artery with his vascular doctor when he went in to check on his fistula for his arm and subsequently they did not feel like he had any hemodynamically unstable flow into the lower extremity this is good news. Nonetheless I think that the biggest issue here is going to be getting some of the eschar off so that we get the wounds med headed in the appropriate direction. Patient History Information obtained from Patient. Allergies enalapril Social History Never smoker, Marital Status - Married, Alcohol Use - Never,  Drug Use - No History, Caffeine Use - Never. Medical History Cardiovascular Patient has history of Coronary Artery Disease Endocrine Patient has history of Type II Diabetes Patient is treated with Insulin. Blood sugar is not tested. Review of Systems (ROS) Genitourinary Complains or has symptoms of Kidney failure/ Dialysis. Integumentary (Skin) Complains or has symptoms of Wounds. Oncologic prostat 2011 Objective Constitutional patient is hypertensive.. pulse regular and within target range for patient.Marland Kitchen respirations regular, non-labored and within target range for patient.Marland Kitchen temperature within target range for patient.. Well-nourished and well-hydrated in no acute distress. Vitals Time Taken: 8:58 AM, Height: 68 in, Source: Stated, Weight: 172 lbs, Source: Measured, BMI: 26.1, Temperature: 98.4 F, Pulse: 81 bpm, Respiratory Rate: 18 breaths/min, Blood Pressure: 162/63 mmHg. Eyes conjunctiva clear no eyelid edema noted. pupils equal round and reactive to light and accommodation. Ears, Nose, Mouth, and Throat  Mans, Haile [name]. ([id number]) no gross abnormality of ear auricles or external auditory canals. normal hearing noted during conversation. mucus membranes moist. Respiratory normal breathing without difficulty. Cardiovascular 2+ dorsalis pedis/posterior tibialis pulses. no clubbing, cyanosis, significant edema, Musculoskeletal normal gait and posture. no significant deformity or arthritic changes, no loss or range of motion, no clubbing. Psychiatric this patient is able to make decisions and demonstrates good insight into disease process. Alert and Oriented x 3. pleasant and cooperative. General Notes: Upon evaluation patient's wound bed actually showed signs of significant eschar covering the surface of the wounds unfortunately. With that being said I do believe that the patient would benefit from clearing some of this away carefully again I do not recommend any aggressive  sharp debridement when it comes to calciphylaxis but I think a superficial and careful debridement to remove the eschar and then allow something like Dakin's solution to clean up the surface of the wounds can be highly beneficial in these patients have seen at time and again with similar patients that have treated. Therefore that was proceeded with today I did remove the majority the eschar and he seems to be doing quite well. Integumentary (Hair, Skin) Wound #1 status is Open. Original cause of wound was Gradually Appeared. The date acquired was: 04/05/2021. The wound is located on the Left,Posterior Lower Leg. The wound measures 20cm length x 11cm width x 0.4cm depth; 172.788cm^2 area and 69.115cm^3 volume. There is Fat Layer (Subcutaneous Tissue) exposed. There is no tunneling or undermining noted. There is a medium amount of serosanguineous drainage noted. There is small (1-33%) red, pink granulation within the wound bed. There is a large (67-100%) amount of necrotic tissue within the wound bed including Eschar and Adherent Slough. Assessment Active Problems ICD-10 Unspecified disorder of calcium metabolism Non-pressure chronic ulcer of other part of left lower leg with fat layer exposed Dependence on renal dialysis Chronic kidney disease, stage 4 (severe) Atherosclerotic heart disease of native coronary artery without angina pectoris Procedures Wound #1 Pre-procedure diagnosis of Wound #1 is a Calciphylaxis located on the Left,Posterior Lower Leg . There was a Excisional Skin/Subcutaneous Tissue Debridement with a total area of 50 sq cm performed by Tommie Sams., PA-C. With the following instrument(s): Scissors to remove Viable and Non-Viable tissue/material. Material removed includes Eschar, Subcutaneous Tissue, Skin: Dermis, and Skin: Epidermis. No specimens were taken. A time out was conducted at 09:45, prior to the start of the procedure. A Minimum amount of bleeding was controlled  with Pressure. The procedure was tolerated well with a pain level of 4 throughout and a pain level of 1 following the procedure. Post Debridement Measurements: 20cm length x 11cm width x 0.4cm depth; 69.115cm^3 volume. Character of Wound/Ulcer Post Debridement is improved. Post procedure Diagnosis Wound #1: Same as Pre-Procedure Plan Follow-up Appointments: Return Appointment in 1 week. Edema Control - Lymphedema / Segmental Compressive Device / Other: Elevate, Exercise Daily and Avoid Standing for Long Periods of Time. Elevate legs to the level of the heart and pump ankles as often as possible Elevate leg(s) parallel to the floor when sitting. The following medication(s) was prescribed: Dakin's Solution miscellaneous 0.25 % solution Moisten gauze with Dakin's solution then wring out leaving gauze damp not saturated before packing daily into the wound as directed in clini starting 05/24/2021 [name], [name]. ([id number]) WOUND #1: - Lower Leg Wound Laterality: Left, Posterior Primary Dressing: Kerlix AMD Roll Dressing, 4.5x 4.1 (in/in) (DME) (Generic) 1 x Per Day/30 Days Discharge Instructions: moisten with  normal saline Secondary Dressing: Kerlix 4.5 x 4.1 (in/yd) (DME) (Generic) 1 x Per Day/30 Days Discharge Instructions: Apply Kerlix 4.5 x 4.1 (in/yd) as instructed Secured With: 88M Medipore H Soft Cloth Surgical Tape, 2x2 (in/yd) (DME) (Generic) 1 x Per Day/30 Days 1. Based on what I am seeing currently I am going to recommend that we go ahead and initiate treatment with a Dakin's moistened gauze dressing to the open wound locations. I think this will do well to help clear away some of the necrotic debris. 2. I am also can recommend that we have the patient change this daily his wife is present with him she can help him with these dressing changes. 3. I am also going to recommend the patient continue to elevate his legs much as possible to ensure that there is no swelling although do not  see any significant swelling right now we just want to keep a good eye on things. We will see patient back for reevaluation in 1 week here in the clinic. If anything worsens or changes patient will contact our office for additional recommendations. Electronic Signature(s) Signed: 05/25/2021 8:02:15 AM By: Worthy Keeler PA-C Entered By: Worthy Keeler on 05/25/2021 08:02:15 [name], [name] ([id number]) -------------------------------------------------------------------------------- ROS/PFSH Details Patient Name: [name]. Date of Service: 05/24/2021 8:45 AM Medical Record Number: [id number] Patient Account Number: [id number] Date of Birth/Sex: 1940/10/15 (80 y.o. M) Treating RN: Carlene Coria Primary Care Provider: Jenna Luo Other Clinician: Referring Provider: Jenna Luo Treating Provider/Extender: Skipper Cliche in Treatment: 0 Information Obtained From Patient Genitourinary Complaints and Symptoms: Positive for: Kidney failure/ Dialysis Integumentary (Skin) Complaints and Symptoms: Positive for: Wounds Cardiovascular Medical History: Positive for: Coronary Artery Disease Endocrine Medical History: Positive for: Type II Diabetes Time with diabetes: 51 Treated with: Insulin Blood sugar tested every day: No Oncologic Complaints and Symptoms: Review of System Notes: prostat 2011 Immunizations Pneumococcal Vaccine: Received Pneumococcal Vaccination: Yes Received Pneumococcal Vaccination On or After 60th Birthday: Yes Implantable Devices No devices added Family and Social History Never smoker; Marital Status - Married; Alcohol Use: Never; Drug Use: No History; Caffeine Use: Never; Financial Concerns: No; Food, Clothing or Shelter Needs: No; Support System Lacking: No; Transportation Concerns: No Electronic Signature(s) Signed: 05/25/2021 4:19:44 PM By: Carlene Coria RN Signed: 05/25/2021 4:28:58 PM By: Worthy Keeler PA-C Entered By: Carlene Coria on 05/24/2021 09:04:34 [name], [name]. ([id number]) -------------------------------------------------------------------------------- SuperBill Details Patient Name: Mark Derry, Karlo [name]. Date of Service: 05/24/2021 Medical Record Number: [id number] Patient Account Number: [id number] Date of Birth/Sex: Dec 04, 1940 (80 y.o. M) Treating RN: Carlene Coria Primary Care Provider: Jenna Luo Other Clinician: Referring Provider: Jenna Luo Treating Provider/Extender: Skipper Cliche in Treatment: 0 Diagnosis Coding ICD-10 Codes Code Description E83.50 Unspecified disorder of calcium metabolism L97.822 Non-pressure chronic ulcer of other part of left lower leg with fat layer exposed Z99.2 Dependence on renal dialysis N18.4 Chronic kidney disease, stage 4 (severe) I25.10 Atherosclerotic heart disease of native coronary artery without angina pectoris Facility Procedures CPT4 Code: 29476546 Description: 50354 - DEB SUBQ TISSUE 20 SQ CM/< Modifier: Quantity: 1 CPT4 Code: Description: ICD-10 Diagnosis Description L97.822 Non-pressure chronic ulcer of other part of left lower leg with fat layer Modifier: exposed Quantity: CPT4 Code: 65681275 Description: 17001 - DEB SUBQ TISS EA ADDL 20CM Modifier: Quantity: 2 CPT4 Code: Description: ICD-10 Diagnosis Description L97.822 Non-pressure chronic ulcer of other part of left lower leg with fat layer Modifier: exposed Quantity: Physician Procedures CPT4 Code: 7494496 Description: 75916 - WC PHYS  LEVEL 4 - NEW PT Modifier: 25 Quantity: 1 CPT4 Code: Description: ICD-10 Diagnosis Description E83.50 Unspecified disorder of calcium metabolism L97.822 Non-pressure chronic ulcer of other part of left lower leg with fat layer Z99.2 Dependence on renal dialysis N18.4 Chronic kidney disease, stage 4  (severe) Modifier: exposed Quantity: CPT4 Code: 8421031 Description: 28118 - WC PHYS SUBQ TISS 20 SQ CM Modifier: Quantity: 1 CPT4  Code: Description: ICD-10 Diagnosis Description L97.822 Non-pressure chronic ulcer of other part of left lower leg with fat layer Modifier: exposed Quantity: CPT4 Code: 8677373 Description: 66815 - WC PHYS SUBQ TISS EA ADDL 20 CM Modifier: Quantity: 2 CPT4 Code: Description: ICD-10 Diagnosis Description L97.822 Non-pressure chronic ulcer of other part of left lower leg with fat layer Modifier: exposed Quantity: Electronic Signature(s) Signed: 05/25/2021 8:02:36 AM By: Worthy Keeler PA-C Entered By: Worthy Keeler on 05/25/2021 08:02:36

## 2021-05-28 DIAGNOSIS — N2581 Secondary hyperparathyroidism of renal origin: Secondary | ICD-10-CM | POA: Diagnosis not present

## 2021-05-28 DIAGNOSIS — E876 Hypokalemia: Secondary | ICD-10-CM | POA: Diagnosis not present

## 2021-05-28 DIAGNOSIS — N186 End stage renal disease: Secondary | ICD-10-CM | POA: Diagnosis not present

## 2021-05-28 DIAGNOSIS — D631 Anemia in chronic kidney disease: Secondary | ICD-10-CM | POA: Diagnosis not present

## 2021-05-28 DIAGNOSIS — D689 Coagulation defect, unspecified: Secondary | ICD-10-CM | POA: Diagnosis not present

## 2021-05-28 DIAGNOSIS — Z992 Dependence on renal dialysis: Secondary | ICD-10-CM | POA: Diagnosis not present

## 2021-05-29 NOTE — Progress Notes (Signed)
BRENTIN, SHIN ([id number]) Visit Report for 05/24/2021 Allergy List Details Patient Name: [name], [name]. Date of Service: 05/24/2021 8:45 AM Medical Record Number: [id number] Patient Account Number: [id number] Date of Birth/Sex: 05/28/1941 (79 y.o. M) Treating RN: Carlene Coria Primary Care Claire Bridge: Jenna Luo Other Clinician: Referring Alanda Colton: Jenna Luo Treating Melony Tenpas/Extender: Jeri Cos Weeks in Treatment: 0 Allergies Active Allergies enalapril Allergy Notes Electronic Signature(s) Signed: 05/25/2021 4:19:44 PM By: Carlene Coria RN Entered By: Carlene Coria on 05/24/2021 09:00:19 [name], [name] ([id number]) -------------------------------------------------------------------------------- [place] Information Details Patient Name: Mark Morale L. Date of Service: 05/24/2021 8:45 AM Medical Record Number: [id number] Patient Account Number: [id number] Date of Birth/Sex: [birth year] (80 y.o. M) Treating RN: Carlene Coria Primary Care Ocia Simek: Jenna Luo Other Clinician: Referring Worth Kober: Jenna Luo Treating Woodson Macha/Extender: Skipper Cliche in Treatment: 0 Visit Information Patient Arrived: Wheel Chair Arrival Time: 08:56 Accompanied By: wife Transfer Assistance: None Patient Identification Verified: Yes Secondary Verification Process Completed: Yes Patient Requires Transmission-Based Precautions: No Patient Has Alerts: No Electronic Signature(s) Signed: 05/25/2021 4:19:44 PM By: Carlene Coria RN Entered By: Carlene Coria on 05/24/2021 08:56:59 [name], [name] ([id number]) -------------------------------------------------------------------------------- Clinic Level of Care Assessment Details Patient Name: Mark Morale L. Date of Service: 05/24/2021 8:45 AM Medical Record Number: [id number] Patient Account Number: [id number] Date of Birth/Sex: 1941/04/01 (80 y.o. M) Treating RN: Carlene Coria Primary Care Brezlyn Manrique: Jenna Luo  Other Clinician: Referring Carley Glendenning: Jenna Luo Treating Damien Batty/Extender: Skipper Cliche in Treatment: 0 Clinic Level of Care Assessment Items TOOL 1 Quantity Score X - Use when EandM and Procedure is performed on INITIAL visit 1 0 ASSESSMENTS - Nursing Assessment / Reassessment X - General Physical Exam (combine w/ comprehensive assessment (listed just below) when performed on new 1 20 pt. evals) X- 1 25 Comprehensive Assessment (HX, ROS, Risk Assessments, Wounds Hx, etc.) ASSESSMENTS - Wound and Skin Assessment / Reassessment []  - Dermatologic / Skin Assessment (not related to wound area) 0 ASSESSMENTS - Ostomy and/or Continence Assessment and Care []  - Incontinence Assessment and Management 0 []  - 0 Ostomy Care Assessment and Management (repouching, etc.) PROCESS - Coordination of Care X - Simple Patient / Family Education for ongoing care 1 15 []  - 0 Complex (extensive) Patient / Family Education for ongoing care X- 1 10 Staff obtains Consents, Records, Test Results / Process Orders []  - 0 Staff telephones HHA, Nursing Homes / Clarify orders / etc []  - 0 Routine Transfer to another Facility (non-emergent condition) []  - 0 Routine Hospital Admission (non-emergent condition) X- 1 15 New Admissions / Biomedical engineer / Ordering NPWT, Apligraf, etc. []  - 0 Emergency Hospital Admission (emergent condition) PROCESS - Special Needs []  - Pediatric / Minor Patient Management 0 []  - 0 Isolation Patient Management []  - 0 Hearing / Language / Visual special needs []  - 0 Assessment of Community assistance (transportation, D/C planning, etc.) []  - 0 Additional assistance / Altered mentation []  - 0 Support Surface(s) Assessment (bed, cushion, seat, etc.) INTERVENTIONS - Miscellaneous []  - External ear exam 0 []  - 0 Patient Transfer (multiple staff / Civil Service fast streamer / Similar devices) []  - 0 Simple Staple / Suture removal (25 or less) []  - 0 Complex Staple /  Suture removal (26 or more) []  - 0 Hypo/Hyperglycemic Management (do not check if billed separately) []  - 0 Ankle / Brachial Index (ABI) - do not check if billed separately Has the patient been seen at the hospital within the last three years: Yes Total Score: 85 Level Of Care: New/Established -  Level 3 [name], [name] ([id number]) Electronic Signature(s) Signed: 05/29/2021 4:18:25 PM By: Carlene Coria RN Entered By: Carlene Coria on 05/28/2021 13:24:34 [name], [name] Kitchen ([id number]) -------------------------------------------------------------------------------- Encounter Discharge Information Details Patient Name: Mark Morale L. Date of Service: 05/24/2021 8:45 AM Medical Record Number: [id number] Patient Account Number: [id number] Date of Birth/Sex: 12-06-1940 (80 y.o. M) Treating RN: Carlene Coria Primary Care Amye Grego: Jenna Luo Other Clinician: Referring Jara Feider: Jenna Luo Treating Modine Oppenheimer/Extender: Skipper Cliche in Treatment: 0 Encounter Discharge Information Items Post Procedure Vitals Discharge Condition: Stable Temperature (F): 98.4 Ambulatory Status: Ambulatory Pulse (bpm): 81 Discharge Destination: Home Respiratory Rate (breaths/min): 18 Transportation: Private Auto Blood Pressure (mmHg): 162/63 Accompanied By: self Schedule Follow-up Appointment: Yes Clinical Summary of Care: Patient Declined Electronic Signature(s) Signed: 05/28/2021 1:26:05 PM By: Carlene Coria RN Entered By: Carlene Coria on 05/28/2021 13:26:05 [name], [name] ([id number]) -------------------------------------------------------------------------------- Lower Extremity Assessment Details Patient Name: [name], [name] Date of Service: 05/24/2021 8:45 AM Medical Record Number: [id number] Patient Account Number: [id number] Date of Birth/Sex: 1940-12-16 (80 y.o. M) Treating RN: Carlene Coria Primary Care Sritha Chauncey: Jenna Luo Other Clinician: Referring Mikella Linsley:  Jenna Luo Treating Donalda Job/Extender: Skipper Cliche in Treatment: 0 Edema Assessment Assessed: [Left: No] [Right: No] Edema: [Left: Ye] [Right: s] Calf Left: Right: Point of Measurement: 34 cm From Medial Instep 20 cm Ankle Left: Right: Point of Measurement: 9 cm From Medial Instep 11 cm Knee To Floor Left: Right: From Medial Instep 44 cm Vascular Assessment Pulses: Dorsalis Pedis Palpable: [Left:Yes] Electronic Signature(s) Signed: 05/25/2021 4:19:44 PM By: Carlene Coria RN Entered By: Carlene Coria on 05/24/2021 09:16:55 [name], [name] ([id number]) -------------------------------------------------------------------------------- Multi Wound Chart Details Patient Name: Mark Morale L. Date of Service: 05/24/2021 8:45 AM Medical Record Number: [id number] Patient Account Number: [id number] Date of Birth/Sex: 12/19/1940 (80 y.o. M) Treating RN: Carlene Coria Primary Care [place] Gipe: Jenna Luo Other Clinician: Referring Lilac Hoff: Jenna Luo Treating Cassie Henkels/Extender: Skipper Cliche in Treatment: 0 Vital Signs Height(in): 68 Pulse(bpm): 81 Weight(lbs): 172 Blood Pressure(mmHg): 162/63 Body Mass Index(BMI): 26 Temperature(F): 98.4 Respiratory Rate(breaths/min): 18 Photos: [N/A:N/A] Wound Location: Left, Posterior Lower Leg N/A N/A Wounding Event: Gradually Appeared N/A N/A Primary Etiology: Calciphylaxis N/A N/A Comorbid History: Coronary Artery Disease, Type II N/A N/A Diabetes Date Acquired: 04/05/2021 N/A N/A Weeks of Treatment: 0 N/A N/A Wound Status: Open N/A N/A Clustered Wound: Yes N/A N/A Measurements L x W x D (cm) 20x11x0.4 N/A N/A Area (cm) : 172.788 N/A N/A Volume (cm) : 69.115 N/A N/A Classification: Full Thickness Without Exposed N/A N/A Support Structures Exudate Amount: Medium N/A N/A Exudate Type: Serosanguineous N/A N/A Exudate Color: red, brown N/A N/A Granulation Amount: Small (1-33%) N/A N/A Granulation Quality:  Red, Pink N/A N/A Necrotic Amount: Large (67-100%) N/A N/A Necrotic Tissue: Eschar, Adherent Slough N/A N/A Exposed Structures: Fat Layer (Subcutaneous Tissue): N/A N/A Yes Fascia: No Tendon: No Muscle: No Joint: No Bone: No Epithelialization: None N/A N/A Treatment Notes Electronic Signature(s) Signed: 05/25/2021 4:19:44 PM By: Carlene Coria RN Entered By: Carlene Coria on 05/24/2021 09:41:43 [name], [name] ([id number]) -------------------------------------------------------------------------------- [place] Details Patient Name: Mark Morale L. Date of Service: 05/24/2021 8:45 AM Medical Record Number: [id number] Patient Account Number: [id number] Date of Birth/Sex: Jan 25, 1941 (80 y.o. M) Treating RN: Carlene Coria Primary Care Georgi Navarrete: Jenna Luo Other Clinician: Referring Fatema Rabe: Jenna Luo Treating Meha Vidrine/Extender: Skipper Cliche in Treatment: 0 Active Inactive Abuse / Safety / Falls / Self Care Management Nursing Diagnoses: Potential for injury related to falls Goals: Patient will  remain injury free related to falls Date Initiated: 05/24/2021 Target Resolution Date: 06/24/2021 Goal Status: Active Interventions: Assess Activities of Daily Living upon admission and as needed Assess fall risk on admission and as needed Assess: immobility, friction, shearing, incontinence upon admission and as needed Assess impairment of mobility on admission and as needed per policy Assess personal safety and home safety (as indicated) on admission and as needed Assess self care needs on admission and as needed Notes: Nutrition Nursing Diagnoses: Impaired glucose control: actual or potential Goals: Patient/caregiver agrees to and verbalizes understanding of need to use nutritional supplements and/or vitamins as prescribed Date Initiated: 05/24/2021 Target Resolution Date: 06/25/2021 Goal Status: Active Interventions: Assess HgA1c results  as ordered upon admission and as needed Assess patient nutrition upon admission and as needed per policy Notes: Wound/Skin Impairment Nursing Diagnoses: Knowledge deficit related to ulceration/compromised skin integrity Goals: Patient/caregiver will verbalize understanding of skin care regimen Date Initiated: 05/24/2021 Target Resolution Date: 06/25/2021 Goal Status: Active Ulcer/skin breakdown will have a volume reduction of 30% by week 4 Date Initiated: 05/24/2021 Target Resolution Date: 07/24/2021 Goal Status: Active Ulcer/skin breakdown will have a volume reduction of 50% by week 8 Date Initiated: 05/24/2021 Target Resolution Date: 08/24/2021 Goal Status: Active Ulcer/skin breakdown will have a volume reduction of 80% by week 12 Date Initiated: 05/24/2021 Target Resolution Date: 09/24/2021 [name], TEED ([id number]) Goal Status: Active Ulcer/skin breakdown will heal within 14 weeks Date Initiated: 05/24/2021 Target Resolution Date: 10/22/2021 Goal Status: Active Interventions: Assess patient/caregiver ability to obtain necessary supplies Assess patient/caregiver ability to perform ulcer/skin care regimen upon admission and as needed Assess ulceration(s) every visit Notes: Electronic Signature(s) Signed: 05/25/2021 4:19:44 PM By: Carlene Coria RN Entered By: Carlene Coria on 05/24/2021 09:41:30 Thunder, Jeffory L. ([id number]) -------------------------------------------------------------------------------- Pain Assessment Details Patient Name: Mark Morale L. Date of Service: 05/24/2021 8:45 AM Medical Record Number: [id number] Patient Account Number: [id number] Date of Birth/Sex: 07-15-1941 (80 y.o. M) Treating RN: Carlene Coria Primary Care Weiland Tomich: Jenna Luo Other Clinician: Referring Destenee Guerry: Jenna Luo Treating Danese Dorsainvil/Extender: Skipper Cliche in Treatment: 0 Active Problems Location of Pain Severity and Description of Pain Patient Has Paino  Yes Site Locations With Dressing Change: Yes Duration of the Pain. Constant / Intermittento Constant Rate the pain. Current Pain Level: 2 Worst Pain Level: 10 Least Pain Level: 1 Tolerable Pain Level: 5 Character of Pain Describe the Pain: Burning Pain Management and Medication Current Pain Management: Medication: Yes Cold Application: No Rest: Yes Massage: No Activity: No T.E.N.S.: No Heat Application: No Leg drop or elevation: No Is the Current Pain Management Adequate: Inadequate How does your wound impact your activities of daily livingo Sleep: Yes Bathing: No Appetite: No Relationship With Others: No Bladder Continence: No Emotions: No Bowel Continence: No Work: No Toileting: No Drive: No Dressing: No Hobbies: No Electronic Signature(s) Signed: 05/25/2021 4:19:44 PM By: Carlene Coria RN Entered By: Carlene Coria on 05/24/2021 08:58:39 Cassells, Christino [name] Kitchen ([id number]) -------------------------------------------------------------------------------- Patient/Caregiver Education Details Patient Name: Mark Morale L. Date of Service: 05/24/2021 8:45 AM Medical Record Number: [id number] Patient Account Number: [id number] Date of Birth/Gender: 03-25-1941 (80 y.o. M) Treating RN: Carlene Coria Primary Care Physician: Jenna Luo Other Clinician: Referring Physician: Jenna Luo Treating Physician/Extender: Skipper Cliche in Treatment: 0 Education Assessment Education Provided To: Patient Education Topics Provided Wound/Skin Impairment: Methods: Explain/Verbal Responses: State content correctly Electronic Signature(s) Signed: 05/29/2021 4:18:25 PM By: Carlene Coria RN Entered By: Carlene Coria on 05/28/2021 13:24:54 Deriso, Amer L. ([id number]) -------------------------------------------------------------------------------- Wound Assessment Details Patient  Name: Bruneau, Christo L. Date of Service: 05/24/2021 8:45 AM Medical Record Number:  [id number] Patient Account Number: [id number] Date of Birth/Sex: 07/06/1941 (80 y.o. M) Treating RN: Carlene Coria Primary Care Kordelia Severin: Jenna Luo Other Clinician: Referring Haidynn Almendarez: Jenna Luo Treating Hunter Pinkard/Extender: Skipper Cliche in Treatment: 0 Wound Status Wound Number: 1 Primary Etiology: Calciphylaxis Wound Location: Left, Posterior Lower Leg Wound Status: Open Wounding Event: Gradually Appeared Comorbid History: Coronary Artery Disease, Type II Diabetes Date Acquired: 04/05/2021 Weeks Of Treatment: 0 Clustered Wound: Yes Photos Wound Measurements Length: (cm) 20 Width: (cm) 11 Depth: (cm) 0.4 Area: (cm) 172.788 Volume: (cm) 69.115 % Reduction in Area: % Reduction in Volume: Epithelialization: None Tunneling: No Undermining: No Wound Description Classification: Full Thickness Without Exposed Support Structu Exudate Amount: Medium Exudate Type: Serosanguineous Exudate Color: red, brown res Foul Odor After Cleansing: No Slough/Fibrino Yes Wound Bed Granulation Amount: Small (1-33%) Exposed Structure Granulation Quality: Red, Pink Fascia Exposed: No Necrotic Amount: Large (67-100%) Fat Layer (Subcutaneous Tissue) Exposed: Yes Necrotic Quality: Eschar, Adherent Slough Tendon Exposed: No Muscle Exposed: No Joint Exposed: No Bone Exposed: No Treatment Notes Wound #1 (Lower Leg) Wound Laterality: Left, Posterior Cleanser Peri-Wound Care Topical Primary Dressing Schimpf, Rashaun L. ([id number]) Kerlix AMD Roll Dressing, 4.5x 4.1 (in/in) Discharge Instruction: moisten with normal saline Secondary Dressing Kerlix 4.5 x 4.1 (in/yd) Discharge Instruction: Apply Kerlix 4.5 x 4.1 (in/yd) as instructed Secured With 63M Medipore H Soft Cloth Surgical Tape, 2x2 (in/yd) Compression Wrap Compression Stockings Add-Ons Electronic Signature(s) Signed: 05/25/2021 4:19:44 PM By: Carlene Coria RN Entered By: Carlene Coria on 05/24/2021 09:16:07 Mazariego,  Breyton L. ([id number]) -------------------------------------------------------------------------------- Vitals Details Patient Name: Mark Morale L. Date of Service: 05/24/2021 8:45 AM Medical Record Number: [id number] Patient Account Number: [id number] Date of Birth/Sex: [birth year] (80 y.o. M) Treating RN: Carlene Coria Primary Care Emelin Dascenzo: Jenna Luo Other Clinician: Referring Kasmira Cacioppo: Jenna Luo Treating Delphine Sizemore/Extender: Skipper Cliche in Treatment: 0 Vital Signs Time Taken: 08:58 Temperature (F): 98.4 Height (in): 68 Pulse (bpm): 81 Source: Stated Respiratory Rate (breaths/min): 18 Weight (lbs): 172 Blood Pressure (mmHg): 162/63 Source: Measured Reference Range: 80 - 120 mg / dl Body Mass Index (BMI): 26.1 Electronic Signature(s) Signed: 05/25/2021 4:19:44 PM By: Carlene Coria RN Entered By: Carlene Coria on 05/24/2021 08:59:38

## 2021-05-30 ENCOUNTER — Encounter: Payer: Self-pay | Admitting: Family Medicine

## 2021-05-30 ENCOUNTER — Other Ambulatory Visit: Payer: PPO

## 2021-05-30 ENCOUNTER — Encounter: Payer: PPO | Admitting: Vascular Surgery

## 2021-05-30 DIAGNOSIS — A419 Sepsis, unspecified organism: Secondary | ICD-10-CM | POA: Diagnosis not present

## 2021-05-30 DIAGNOSIS — N186 End stage renal disease: Secondary | ICD-10-CM | POA: Diagnosis not present

## 2021-05-30 DIAGNOSIS — G9341 Metabolic encephalopathy: Secondary | ICD-10-CM | POA: Diagnosis not present

## 2021-05-30 DIAGNOSIS — N189 Chronic kidney disease, unspecified: Secondary | ICD-10-CM | POA: Diagnosis not present

## 2021-05-30 NOTE — Telephone Encounter (Signed)
Ok to refill??  Last office visit 04/12/2021.  Last refill 05/30/2021.  Prescription states (2) tabs PO Q 6hrs PRN. Would it be better to increase dose to 10/320m?

## 2021-05-31 ENCOUNTER — Other Ambulatory Visit: Payer: Self-pay

## 2021-05-31 ENCOUNTER — Encounter: Payer: PPO | Admitting: Physician Assistant

## 2021-05-31 DIAGNOSIS — E1122 Type 2 diabetes mellitus with diabetic chronic kidney disease: Secondary | ICD-10-CM | POA: Diagnosis not present

## 2021-05-31 DIAGNOSIS — L97222 Non-pressure chronic ulcer of left calf with fat layer exposed: Secondary | ICD-10-CM | POA: Diagnosis not present

## 2021-05-31 MED ORDER — HYDROCODONE-ACETAMINOPHEN 5-325 MG PO TABS: 2.0000 | ORAL_TABLET | Freq: Four times a day (QID) | ORAL | 0 refills | Status: DC | PRN

## 2021-05-31 NOTE — Progress Notes (Addendum)
GAVINN, COLLARD ([id number]) Visit Report for 05/31/2021 Chief Complaint Document Details Patient Name: [name], [name]. Date of Service: 05/31/2021 1:15 PM Medical Record Number: [id number] Patient Account Number: [id number] Date of Birth/Sex: [birth year] (80 y.o. M) Treating RN: Carlene Coria Primary Care Provider: Jenna Luo Other Clinician: Referring Provider: Jenna Luo Treating Provider/Extender: Skipper Cliche in Treatment: 1 Information Obtained from: Patient Chief Complaint Left LE Calciphylaxis Electronic Signature(s) Signed: 05/31/2021 1:23:18 PM By: Worthy Keeler PA-C Entered By: Worthy Keeler on 05/31/2021 13:23:18 [name], [name] ([id number]) -------------------------------------------------------------------------------- HPI Details Patient Name: Mark Morale L. Date of Service: 05/31/2021 1:15 PM Medical Record Number: [id number] Patient Account Number: [id number] Date of Birth/Sex: 04-08-1941 (80 y.o. 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 This has been present for a couple of months currently he is on the sodium thiosulfate at dialysis. He tells me that they picked up on this quickly and have been managing it but he really has not had any help with anything else from the standpoint of progressing the wound itself. The patient does have again end-stage renal disease which currently is listed as stage IV but he is on renal dialysis. He also has coronary artery disease as well as going shortly for formal arterial studies although he did have a quick test of the tibial artery  with his vascular doctor when he went in to check on his fistula for his arm and subsequently they did not feel like he had any hemodynamically unstable flow into the lower extremity this is good news. Nonetheless I think that the biggest issue here is going to be getting some of the eschar off so that we get the wounds med headed in the appropriate direction. 05/31/2021 upon evaluation today patient appears to be doing well with regard to his leg ulceration with calciphylaxis. I am very pleased in this regard. Fortunately there does not appear to be any signs of active infection at this time which is great news. No fevers, chills, nausea, vomiting, or diarrhea. Electronic Signature(s) Signed: 05/31/2021 2:24:57 PM By: Worthy Keeler PA-C Entered By: Worthy Keeler on 05/31/2021 14:24:57 Schram, [name] ([id number]) -------------------------------------------------------------------------------- Physical Exam Details Patient Name: [name], [name] Date of Service: 05/31/2021 1:15 PM Medical Record Number: [id number] Patient Account Number: [id number] Date of Birth/Sex: 10/16/1940 (80 y.o. M) Treating RN: Carlene Coria Primary Care Provider: Jenna Luo Other Clinician: Referring Provider: Jenna Luo Treating Provider/Extender: Skipper Cliche in Treatment: 1 Constitutional Well-nourished and well-hydrated in no acute distress. Respiratory normal breathing without difficulty. Psychiatric this patient is able to make decisions and demonstrates good insight into disease process. Alert and Oriented x 3. pleasant and cooperative. Notes Upon inspection patient's wound bed actually showed signs of good granulation epithelization at this point. Fortunately there does not appear to be any signs of active infection systemically which is great news and overall very pleased with where we stand today. I do think he is making good progress he still has some eschar noted much less than  last time and I do feel like this is cleaning up with a Dakin's moistened gauze currently. They finally got their bottle of this yesterday. Electronic Signature(s) Signed: 05/31/2021 2:25:28 PM  By: Worthy Keeler PA-C Entered By: Worthy Keeler on 05/31/2021 14:25:28 [name], [name] ([id number]) -------------------------------------------------------------------------------- Physician Orders Details Patient Name: Mark Morale L. Date of Service: 05/31/2021 1:15 PM Medical Record Number: [id number] Patient Account Number: [id number] Date of Birth/Sex: 10-Apr-1941 (80 y.o. M) Treating RN: Carlene Coria Primary Care Provider: Jenna Luo Other Clinician: Referring Provider: Jenna Luo Treating Provider/Extender: Skipper Cliche in Treatment: 1 Verbal / Phone Orders: No Diagnosis Coding ICD-10 Coding Code Description E83.50 Unspecified disorder of calcium metabolism L97.822 Non-pressure chronic ulcer of other part of left lower leg with fat layer exposed Z99.2 Dependence on renal dialysis N18.4 Chronic kidney disease, stage 4 (severe) I25.10 Atherosclerotic heart disease of native coronary artery without angina pectoris Follow-up Appointments o Return Appointment in 1 week. Edema Control - Lymphedema / Segmental Compressive Device / Other o Elevate, Exercise Daily and Avoid Standing for Long Periods of Time. o Elevate legs to the level of the heart and pump ankles as often as possible o Elevate leg(s) parallel to the floor when sitting. Wound Treatment Wound #1 - Lower Leg Wound Laterality: Left, Posterior Primary Dressing: Kerlix AMD Roll Dressing, 4.5x 4.1 (in/in) (Generic) 1 x Per Day/30 Days Discharge Instructions: moisten with normal saline Secondary Dressing: Kerlix 4.5 x 4.1 (in/yd) (Generic) 1 x Per Day/30 Days Discharge Instructions: Apply Kerlix 4.5 x 4.1 (in/yd) as instructed Secured With: 29M Medipore H Soft Cloth Surgical Tape, 2x2 (in/yd) (Generic)  1 x Per Day/30 Days Electronic Signature(s) Signed: 05/31/2021 4:43:43 PM By: Worthy Keeler PA-C Signed: 06/04/2021 6:56:11 PM By: Carlene Coria RN Entered By: Carlene Coria on 05/31/2021 14:14:20 [name], [name] ([id number]) -------------------------------------------------------------------------------- Problem List Details Patient Name: [name], [name] Date of Service: 05/31/2021 1:15 PM Medical Record Number: [id number] Patient Account Number: [id number] Date of Birth/Sex: Apr 26, 1941 (80 y.o. M) Treating RN: Carlene Coria Primary Care Provider: Jenna Luo Other Clinician: Referring Provider: Jenna Luo Treating Provider/Extender: Skipper Cliche in Treatment: 1 Active Problems ICD-10 Encounter Code Description Active Date MDM Diagnosis E83.50 Unspecified disorder of calcium metabolism 05/24/2021 No Yes L97.822 Non-pressure chronic ulcer of other part of left lower leg with fat layer 05/24/2021 No Yes exposed Z99.2 Dependence on renal dialysis 05/24/2021 No Yes N18.4 Chronic kidney disease, stage 4 (severe) 05/24/2021 No Yes I25.10 Atherosclerotic heart disease of native coronary artery without angina 05/24/2021 No Yes pectoris Inactive Problems Resolved Problems Electronic Signature(s) Signed: 05/31/2021 1:23:13 PM By: Worthy Keeler PA-C Entered By: Worthy Keeler on 05/31/2021 13:23:13 [name], [name] ([id number]) -------------------------------------------------------------------------------- Progress Note Details Patient Name: [name], [name] Date of Service: 05/31/2021 1:15 PM Medical Record Number: [id number] Patient Account Number: [id number] Date of Birth/Sex: 08-Dec-1940 (80 y.o. 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 This has been present for a couple of months currently he is on the sodium thiosulfate at dialysis. He tells me that they picked up on this quickly and have been managing it but he really has not had any help with anything else from the standpoint of progressing the wound itself. The patient does have again end-stage renal disease which currently is listed as stage IV but he is on  renal dialysis. He also has coronary artery disease as well as going shortly for formal arterial studies although he did have a quick test of the tibial artery with his vascular doctor when he went in to check on his fistula for his arm and subsequently they did not feel like he had any hemodynamically unstable flow into the lower extremity this is good news. Nonetheless I think that the biggest issue here is going to be getting some of the eschar off so that we get the wounds med headed in the appropriate direction. 05/31/2021 upon evaluation today patient appears to be doing well with regard to his leg ulceration with calciphylaxis. I am very pleased in this regard. Fortunately there does not appear to be any signs of active infection at this time which is great news. No fevers, chills, nausea, vomiting, or diarrhea. Objective Constitutional Well-nourished and well-hydrated in no acute distress. Vitals Time Taken: 1:30 PM, Height: 68 in, Weight: 172 lbs, BMI: 26.1, Temperature: 98.6 F, Pulse: 73 bpm, Respiratory Rate: 18 breaths/min, Blood Pressure: 146/78 mmHg. Respiratory normal breathing without difficulty. Psychiatric this patient is able to make decisions and demonstrates good insight into disease process. Alert and Oriented x 3. pleasant and cooperative. General Notes: Upon  inspection patient's wound bed actually showed signs of good granulation epithelization at this point. Fortunately there does not appear to be any signs of active infection systemically which is great news and overall very pleased with where we stand today. I do think he is making good progress he still has some eschar noted much less than last time and I do feel like this is cleaning up with a Dakin's moistened gauze currently. They finally got their bottle of this yesterday. Integumentary (Hair, Skin) Wound #1 status is Open. Original cause of wound was Gradually Appeared. The date acquired was: 04/05/2021. The wound has been in treatment 1 weeks. The wound is located on the Left,Posterior Lower Leg. The wound measures 20cm length x 11cm width x 0.4cm depth; 172.788cm^2 area and 69.115cm^3 volume. There is Fat Layer (Subcutaneous Tissue) exposed. There is no tunneling or undermining noted. There is a medium amount of serosanguineous drainage noted. There is small (1-33%) red, pink granulation within the wound bed. There is a large (67-100%) amount of necrotic tissue within the wound bed including Eschar and Adherent Slough. Assessment Active Problems ICD-10 [name], [name]. 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 Edema Control - Lymphedema / Segmental Compressive Device / Other: Elevate, Exercise Daily and Avoid Standing for Long Periods of Time. Elevate legs to the level of the heart and pump ankles as often as possible Elevate leg(s) parallel to the floor when sitting. WOUND #1: - Lower Leg Wound Laterality: Left, Posterior Primary Dressing: Kerlix AMD Roll Dressing, 4.5x 4.1 (in/in)  (Generic) 1 x Per Day/30 Days Discharge Instructions: moisten with normal saline Secondary Dressing: Kerlix 4.5 x 4.1 (in/yd) (Generic) 1 x Per Day/30 Days Discharge Instructions: Apply Kerlix 4.5 x 4.1 (in/yd) as instructed Secured With: 65M Medipore H Soft Cloth Surgical Tape, 2x2 (in/yd) (Generic) 1 x Per Day/30 Days 1. I would recommend currently that we going to continue with the wound care measures as before and the patient is in agreement with the plan this includes the  use of the Dakin's moistened gauze dressing which I think is doing a good job. 2. Also can recommend at this time that we have the patient continue with the ABD pads to cover which I think is doing a good job as well currently. 3. We will get a secure this with roll gauze and this should be changed daily. We will see patient back for reevaluation in 1 week here in the clinic. If anything worsens or changes patient will contact our office for additional recommendations. At the visit today patient did mention that he has an issue with his penis as well on the head apparently Dr. Dennard Schaumann his primary care provider is concerned that this could be a issue here with calciphylaxis at this location.. And that this is over the penis region and I did not even see it today my suggestion was that he see about getting in touch with a urologist in [place] I gave him information for someone to call in that regard his wife is can to contact them when they leave today. Electronic Signature(s) Signed: 05/31/2021 2:28:32 PM By: Worthy Keeler PA-C Entered By: Worthy Keeler on 05/31/2021 14:28:31 [name], [name] ([id number]) -------------------------------------------------------------------------------- SuperBill Details Patient Name: Mark Morale L. Date of Service: 05/31/2021 Medical Record Number: [id number] Patient Account Number: [id number] Date of Birth/Sex: 1940-10-23 (80 y.o. M) Treating RN: Carlene Coria Primary Care  Provider: Jenna Luo Other Clinician: Referring Provider: Jenna Luo Treating Provider/Extender: Skipper Cliche in Treatment: 1 Diagnosis Coding ICD-10 Codes Code Description E83.50 Unspecified disorder of calcium metabolism L97.822 Non-pressure chronic ulcer of other part of left lower leg with fat layer exposed Z99.2 Dependence on renal dialysis N18.4 Chronic kidney disease, stage 4 (severe) I25.10 Atherosclerotic heart disease of native coronary artery without angina pectoris Facility Procedures CPT4 Code: [id number] Description: 99213 - WOUND CARE VISIT-LEV 3 EST PT Modifier: Quantity: 1 Physician Procedures CPT4 Code: [id number] Description: 99214 - WC PHYS LEVEL 4 - EST PT Modifier: Quantity: 1 CPT4 Code: Description: ICD-10 Diagnosis Description E83.50 Unspecified disorder of calcium metabolism L97.822 Non-pressure chronic ulcer of other part of left lower leg with fat lay Z99.2 Dependence on renal dialysis N18.4 Chronic kidney disease, stage 4 (severe) Modifier: er exposed Quantity: Electronic Signature(s) Signed: 05/31/2021 2:28:47 PM By: Worthy Keeler PA-C Entered By: Worthy Keeler on 05/31/2021 14:28:47

## 2021-05-31 NOTE — Progress Notes (Addendum)
[name] ([id number]) Visit Report for 05/31/2021 Arrival Information Details Patient Name: [name], Mark. Date of Service: 05/31/2021 1:15 PM Medical Record Number: [id number] Patient Account Number: [id number] Date of Birth/Sex: July 17, 1941 (80 y.o. M) Treating RN: Carlene Coria Primary Care Tommi Crepeau: Jenna Luo Other Clinician: Referring Christohper Dube: Jenna Luo Treating Holley Kocurek/Extender: Skipper Cliche in Treatment: 1 Visit Information History Since Last Visit All ordered tests and consults were completed: No Patient Arrived: Wheel Chair Added or deleted any medications: No Arrival Time: 13:24 Any new allergies or adverse reactions: No Accompanied By: wife Had a fall or experienced change in No Transfer Assistance: [name] activities of daily living that may affect Patient Identification Verified: Yes risk of falls: Secondary Verification Process Completed: Yes Signs or symptoms of abuse/neglect since last visito No Patient Requires Transmission-Based Precautions: No Hospitalized since last visit: No Patient Has Alerts: No Implantable device outside of the clinic excluding No cellular tissue based products placed in the center since last visit: Has Dressing in Place as Prescribed: Yes Has Compression in Place as Prescribed: Yes Pain Present Now: No Electronic Signature(s) Signed: 06/04/2021 6:56:11 PM By: Carlene Coria RN Entered By: Carlene Coria on 05/31/2021 13:30:07 Hershkowitz, Chadwick L. ([id number]) -------------------------------------------------------------------------------- Clinic Level of Care Assessment Details Patient Name: Mark Morale L. Date of Service: 05/31/2021 1:15 PM Medical Record Number: [id number] Patient Account Number: [id number] Date of Birth/Sex: 02-23-1941 (80 y.o. M) Treating RN: Carlene Coria Primary Care Alla Sloma: Jenna Luo Other Clinician: Referring Anihya Tuma: Jenna Luo Treating Cary Wilford/Extender: Skipper Cliche in Treatment: 1 Clinic Level of Care Assessment Items TOOL 4 Quantity Score X - Use when only an EandM is performed on FOLLOW-UP visit 1 0 ASSESSMENTS - Nursing Assessment / Reassessment X - Reassessment of Co-morbidities (includes updates in patient status) 1 10 X- 1 5 Reassessment of Adherence to Treatment Plan ASSESSMENTS - Wound and Skin Assessment / Reassessment X - Simple Wound Assessment / Reassessment - one wound 1 5 X- 1 5 Complex Wound Assessment / Reassessment - multiple wounds X- 1 10 Dermatologic / Skin Assessment (not related to wound area) ASSESSMENTS - Focused Assessment []  - Circumferential Edema Measurements - multi extremities 0 []  - 0 Nutritional Assessment / Counseling / Intervention []  - 0 Lower Extremity Assessment (monofilament, tuning fork, pulses) []  - 0 Peripheral Arterial Disease Assessment (using hand held doppler) ASSESSMENTS - Ostomy and/or Continence Assessment and Care []  - Incontinence Assessment and Management 0 []  - 0 Ostomy Care Assessment and Management (repouching, etc.) PROCESS - Coordination of Care X - Simple Patient / Family Education for ongoing care 1 15 []  - 0 Complex (extensive) Patient / Family Education for ongoing care X- 1 10 Staff obtains Programmer, systems, Records, Test Results / Process Orders []  - 0 Staff telephones HHA, Nursing Homes / Clarify orders / etc []  - 0 Routine Transfer to another Facility (non-emergent condition) []  - 0 Routine Hospital Admission (non-emergent condition) []  - 0 New Admissions / Biomedical engineer / Ordering NPWT, Apligraf, etc. []  - 0 Emergency Hospital Admission (emergent condition) X- 1 10 Simple Discharge Coordination []  - 0 Complex (extensive) Discharge Coordination PROCESS - Special Needs []  - Pediatric / Minor Patient Management 0 []  - 0 Isolation Patient Management []  - 0 Hearing / Language / Visual special needs []  - 0 Assessment of Community assistance  (transportation, D/C planning, etc.) []  - 0 Additional assistance / Altered mentation []  - 0 Support Surface(s) Assessment (bed, cushion, seat, etc.) INTERVENTIONS - Wound Cleansing / Measurement Bartoli, Connell L. (  [id number]) X- 1 5 Simple Wound Cleansing - one wound []  - 0 Complex Wound Cleansing - multiple wounds X- 1 5 Wound Imaging (photographs - any number of wounds) []  - 0 Wound Tracing (instead of photographs) X- 1 5 Simple Wound Measurement - one wound []  - 0 Complex Wound Measurement - multiple wounds INTERVENTIONS - Wound Dressings []  - Small Wound Dressing one or multiple wounds 0 []  - 0 Medium Wound Dressing one or multiple wounds X- 1 20 Large Wound Dressing one or multiple wounds X- 1 5 Application of Medications - topical []  - 0 Application of Medications - injection INTERVENTIONS - Miscellaneous []  - External ear exam 0 []  - 0 Specimen Collection (cultures, biopsies, blood, body fluids, etc.) []  - 0 Specimen(s) / Culture(s) sent or taken to Lab for analysis []  - 0 Patient Transfer (multiple staff / Civil Service fast streamer / Similar devices) []  - 0 Simple Staple / Suture removal (25 or less) []  - 0 Complex Staple / Suture removal (26 or more) []  - 0 Hypo / Hyperglycemic Management (close monitor of Blood Glucose) []  - 0 Ankle / Brachial Index (ABI) - do not check if billed separately X- 1 5 Vital Signs Has the patient been seen at the hospital within the last three years: Yes Total Score: 115 Level Of Care: New/Established - Level 3 Electronic Signature(s) Signed: 06/04/2021 6:56:11 PM By: Carlene Coria RN Entered By: Carlene Coria on 05/31/2021 14:15:05 Sustaita, Leaman L. ([id number]) -------------------------------------------------------------------------------- Encounter Discharge Information Details Patient Name: Mark Morale L. Date of Service: 05/31/2021 1:15 PM Medical Record Number: [id number] Patient Account Number: [id number] Date of Birth/Sex:  Jul 19, 1941 (80 y.o. M) Treating RN: Carlene Coria Primary Care Verne Cove: Jenna Luo Other Clinician: Referring Doshie Maggi: Jenna Luo Treating Tyquarius Paglia/Extender: Skipper Cliche in Treatment: 1 Encounter Discharge Information Items Discharge Condition: Stable Ambulatory Status: Wheelchair Discharge Destination: Home Transportation: Private Auto Accompanied By: wife Schedule Follow-up Appointment: Yes Clinical Summary of Care: Patient Declined Electronic Signature(s) Signed: 06/04/2021 6:56:11 PM By: Carlene Coria RN Entered By: Carlene Coria on 05/31/2021 14:16:02 Docter, Gates L. ([id number]) -------------------------------------------------------------------------------- Lower Extremity Assessment Details Patient Name: [name], [name] Date of Service: 05/31/2021 1:15 PM Medical Record Number: [id number] Patient Account Number: [id number] Date of Birth/Sex: January 19, 1941 (79 y.o. M) Treating RN: Carlene Coria Primary Care Eliya Bubar: Jenna Luo Other Clinician: Referring Finbar Nippert: Jenna Luo Treating Mazel Villela/Extender: Skipper Cliche in Treatment: 1 Edema Assessment Assessed: [Left: No] [Right: No] [Left: Edema] [Right: :] Calf Left: Right: Point of Measurement: 34 cm From Medial Instep 33 cm Ankle Left: Right: Point of Measurement: 9 cm From Medial Instep 23 cm Vascular Assessment Pulses: Dorsalis Pedis Palpable: [Left:Yes] Electronic Signature(s) Signed: 06/04/2021 6:56:11 PM By: Carlene Coria RN Entered By: Carlene Coria on 05/31/2021 13:38:57 Laser, Parv L. ([id number]) -------------------------------------------------------------------------------- Multi Wound Chart Details Patient Name: Mark Morale L. Date of Service: 05/31/2021 1:15 PM Medical Record Number: [id number] Patient Account Number: [id number] Date of Birth/Sex: 1941-02-16 (80 y.o. M) Treating RN: Carlene Coria Primary Care Hargun Spurling: Jenna Luo Other Clinician: Referring  Aynslee Mulhall: Jenna Luo Treating Thelton Graca/Extender: Skipper Cliche in Treatment: 1 Vital Signs Height(in): 68 Pulse(bpm): 40 Weight(lbs): 172 Blood Pressure(mmHg): 146/78 Body Mass Index(BMI): 26 Temperature(F): 98.6 Respiratory Rate(breaths/min): 18 Photos: [N/A:N/A] Wound Location: Left, Posterior Lower Leg N/A N/A Wounding Event: Gradually Appeared N/A N/A Primary Etiology: Calciphylaxis N/A N/A Comorbid History: Coronary Artery Disease, Type II N/A N/A Diabetes Date Acquired: 04/05/2021 N/A N/A Weeks of Treatment: 1 N/A N/A Wound Status: Open N/A N/A Clustered Wound: Yes  N/A N/A Measurements L x W x D (cm) 20x11x0.4 N/A N/A Area (cm) : 172.788 N/A N/A Volume (cm) : 69.115 N/A N/A % Reduction in Area: 0.00% N/A N/A % Reduction in Volume: 0.00% N/A N/A Classification: Full Thickness Without Exposed N/A N/A Support Structures Exudate Amount: Medium N/A N/A Exudate Type: Serosanguineous N/A N/A Exudate Color: red, brown N/A N/A Granulation Amount: Small (1-33%) N/A N/A Granulation Quality: Red, Pink N/A N/A Necrotic Amount: Large (67-100%) N/A N/A Necrotic Tissue: Eschar, Adherent Slough N/A N/A Exposed Structures: Fat Layer (Subcutaneous Tissue): N/A N/A Yes Fascia: No Tendon: No Muscle: No Joint: No Bone: No Epithelialization: [name] N/A N/A Treatment Notes Electronic Signature(s) Signed: 06/04/2021 6:56:11 PM By: Carlene Coria RN Entered By: Carlene Coria on 05/31/2021 14:13:19 Hemmerich, Tarvaris L. ([id number]) -------------------------------------------------------------------------------- [place] Details Patient Name: Mark Morale L. Date of Service: 05/31/2021 1:15 PM Medical Record Number: [id number] Patient Account Number: [id number] Date of Birth/Sex: [birth year] (80 y.o. M) Treating RN: Carlene Coria Primary Care Gavriella Hearst: Jenna Luo Other Clinician: Referring [place] Merida: Jenna Luo Treating Collin Rengel/Extender: Skipper Cliche in Treatment: 1 Active Inactive Abuse / Safety / Falls / Self Care Management Nursing Diagnoses: Potential for injury related to falls Goals: Patient will remain injury free related to falls Date Initiated: 05/24/2021 Target Resolution Date: 06/24/2021 Goal Status: Active Interventions: Assess Activities of Daily Living upon admission and as needed Assess fall risk on admission and as needed Assess: immobility, friction, shearing, incontinence upon admission and as needed Assess impairment of mobility on admission and as needed per policy Assess personal safety and home safety (as indicated) on admission and as needed Assess self care needs on admission and as needed Notes: Nutrition Nursing Diagnoses: Impaired glucose control: actual or potential Goals: Patient/caregiver agrees to and verbalizes understanding of need to use nutritional supplements and/or vitamins as prescribed Date Initiated: 05/24/2021 Target Resolution Date: 06/25/2021 Goal Status: Active Interventions: Assess HgA1c results as ordered upon admission and as needed Assess patient nutrition upon admission and as needed per policy Notes: Wound/Skin Impairment Nursing Diagnoses: Knowledge deficit related to ulceration/compromised skin integrity Goals: Patient/caregiver will verbalize understanding of skin care regimen Date Initiated: 05/24/2021 Target Resolution Date: 06/25/2021 Goal Status: Active Ulcer/skin breakdown will have a volume reduction of 30% by week 4 Date Initiated: 05/24/2021 Target Resolution Date: 07/24/2021 Goal Status: Active Ulcer/skin breakdown will have a volume reduction of 50% by week 8 Date Initiated: 05/24/2021 Target Resolution Date: 08/24/2021 Goal Status: Active Ulcer/skin breakdown will have a volume reduction of 80% by week 12 Date Initiated: 05/24/2021 Target Resolution Date: 09/24/2021 ASTON, LIESKE ([id number]) Goal Status: Active Ulcer/skin breakdown  will heal within 14 weeks Date Initiated: 05/24/2021 Target Resolution Date: 10/22/2021 Goal Status: Active Interventions: Assess patient/caregiver ability to obtain necessary supplies Assess patient/caregiver ability to perform ulcer/skin care regimen upon admission and as needed Assess ulceration(s) every visit Notes: Electronic Signature(s) Signed: 06/04/2021 6:56:11 PM By: Carlene Coria RN Entered By: Carlene Coria on 05/31/2021 14:13:01 Fickett, Asim L. ([id number]) -------------------------------------------------------------------------------- Pain Assessment Details Patient Name: Mark Morale L. Date of Service: 05/31/2021 1:15 PM Medical Record Number: [id number] Patient Account Number: [id number] Date of Birth/Sex: Sep 04, 1940 (80 y.o. M) Treating RN: Carlene Coria Primary Care Jalayiah Bibian: Jenna Luo Other Clinician: Referring Kato Wieczorek: Jenna Luo Treating Abdishakur Gottschall/Extender: Skipper Cliche in Treatment: 1 Active Problems Location of Pain Severity and Description of Pain Patient Has Paino No Site Locations Pain Management and Medication Current Pain Management: Electronic Signature(s) Signed: 06/04/2021 6:56:11 PM By: Carlene Coria RN Entered  ByCarlene Coria on 05/31/2021 13:30:32 Lackie, [name] ([id number]) -------------------------------------------------------------------------------- Patient/Caregiver Education Details Patient Name: [name]. Date of Service: 05/31/2021 1:15 PM Medical Record Number: [id number] Patient Account Number: [id number] Date of Birth/Gender: 11/15/1940 (80 y.o. M) Treating RN: Carlene Coria Primary Care Physician: Jenna Luo Other Clinician: Referring Physician: Jenna Luo Treating Physician/Extender: Skipper Cliche in Treatment: 1 Education Assessment Education Provided To: Patient Education Topics Provided Wound/Skin Impairment: Methods: Explain/Verbal Responses: State content  correctly Electronic Signature(s) Signed: 06/04/2021 6:56:11 PM By: Carlene Coria RN Entered By: Carlene Coria on 05/31/2021 14:15:22 Meech, Cordaryl L. ([id number]) -------------------------------------------------------------------------------- Wound Assessment Details Patient Name: [name], [name] Date of Service: 05/31/2021 1:15 PM Medical Record Number: [id number] Patient Account Number: [id number] Date of Birth/Sex: [birth year] (80 y.o. M) Treating RN: Carlene Coria Primary Care Ozro Russett: Jenna Luo Other Clinician: Referring Lleyton Byers: Jenna Luo Treating Jt Brabec/Extender: Skipper Cliche in Treatment: 1 Wound Status Wound Number: 1 Primary Etiology: Calciphylaxis Wound Location: Left, Posterior Lower Leg Wound Status: Open Wounding Event: Gradually Appeared Comorbid History: Coronary Artery Disease, Type II Diabetes Date Acquired: 04/05/2021 Weeks Of Treatment: 1 Clustered Wound: Yes Photos Wound Measurements Length: (cm) 20 Width: (cm) 11 Depth: (cm) 0.4 Area: (cm) 172.788 Volume: (cm) 69.115 % Reduction in Area: 0% % Reduction in Volume: 0% Epithelialization: [name] Tunneling: No Undermining: No Wound Description Classification: Full Thickness Without Exposed Support Structu Exudate Amount: Medium Exudate Type: Serosanguineous Exudate Color: red, brown res Foul Odor After Cleansing: No Slough/Fibrino Yes Wound Bed Granulation Amount: Small (1-33%) Exposed Structure Granulation Quality: Red, Pink Fascia Exposed: No Necrotic Amount: Large (67-100%) Fat Layer (Subcutaneous Tissue) Exposed: Yes Necrotic Quality: Eschar, Adherent Slough Tendon Exposed: No Muscle Exposed: No Joint Exposed: No Bone Exposed: No Treatment Notes Wound #1 (Lower Leg) Wound Laterality: Left, Posterior Cleanser Peri-Wound Care Topical Primary Dressing Ivancic, Hezikiah L. ([id number]) Kerlix AMD Roll Dressing, 4.5x 4.1 (in/in) Discharge Instruction: moisten with normal  saline Secondary Dressing Kerlix 4.5 x 4.1 (in/yd) Discharge Instruction: Apply Kerlix 4.5 x 4.1 (in/yd) as instructed Secured With 38M Medipore H Soft Cloth Surgical Tape, 2x2 (in/yd) Compression Wrap Compression Stockings Add-Ons Electronic Signature(s) Signed: 06/04/2021 6:56:11 PM By: Carlene Coria RN Entered By: Carlene Coria on 05/31/2021 13:37:33 Fettig, Kento L. ([id number]) -------------------------------------------------------------------------------- Vitals Details Patient Name: Mark Morale L. Date of Service: 05/31/2021 1:15 PM Medical Record Number: [id number] Patient Account Number: [id number] Date of Birth/Sex: 09-11-1940 (80 y.o. M) Treating RN: Carlene Coria Primary Care Skai Lickteig: Jenna Luo Other Clinician: Referring Sonakshi Rolland: Jenna Luo Treating Alexus Galka/Extender: Skipper Cliche in Treatment: 1 Vital Signs Time Taken: 13:30 Temperature (F): 98.6 Height (in): 68 Pulse (bpm): 73 Weight (lbs): 172 Respiratory Rate (breaths/min): 18 Body Mass Index (BMI): 26.1 Blood Pressure (mmHg): 146/78 Reference Range: 80 - 120 mg / dl Electronic Signature(s) Signed: 06/04/2021 6:56:11 PM By: Carlene Coria RN Entered By: Carlene Coria on 05/31/2021 13:30:26

## 2021-06-04 DIAGNOSIS — N186 End stage renal disease: Secondary | ICD-10-CM | POA: Diagnosis not present

## 2021-06-04 DIAGNOSIS — N2581 Secondary hyperparathyroidism of renal origin: Secondary | ICD-10-CM | POA: Diagnosis not present

## 2021-06-04 DIAGNOSIS — Z992 Dependence on renal dialysis: Secondary | ICD-10-CM | POA: Diagnosis not present

## 2021-06-04 DIAGNOSIS — E1122 Type 2 diabetes mellitus with diabetic chronic kidney disease: Secondary | ICD-10-CM | POA: Diagnosis not present

## 2021-06-04 DIAGNOSIS — D631 Anemia in chronic kidney disease: Secondary | ICD-10-CM | POA: Diagnosis not present

## 2021-06-04 DIAGNOSIS — D689 Coagulation defect, unspecified: Secondary | ICD-10-CM | POA: Diagnosis not present

## 2021-06-05 ENCOUNTER — Other Ambulatory Visit: Payer: Self-pay

## 2021-06-05 ENCOUNTER — Ambulatory Visit (INDEPENDENT_AMBULATORY_CARE_PROVIDER_SITE_OTHER): Payer: PPO | Admitting: Urology

## 2021-06-05 ENCOUNTER — Encounter: Payer: Self-pay | Admitting: Urology

## 2021-06-05 DIAGNOSIS — Z992 Dependence on renal dialysis: Secondary | ICD-10-CM

## 2021-06-05 DIAGNOSIS — N186 End stage renal disease: Secondary | ICD-10-CM | POA: Diagnosis not present

## 2021-06-05 NOTE — Progress Notes (Signed)
Assessment: 1. Calciphylaxis of penis   2. ESRD (end stage renal disease) on dialysis [facility])     Plan: Recommend continued local wound care  Wound care consultation for recommendations regarding topical therapy Recommend conservative management with wound care rather than surgical management due to significant risk of poor would healing in patients with calciphylaxis  Chief Complaint:  Chief Complaint  Patient presents with   penile lesion    History of Present Illness:  Mark Jumbo Sr. is a 80 y.o. year old male who is seen in consultation from [name], MD for evaluation of penile lesion.  He was diagnosed with end-stage renal disease and started on dialysis in July 2022.  He has noted a tender lesion on the tip of his penis for several months.  He was recently diagnosed with calciphylaxis of his left leg.  He is being managed with wound care.  He is currently using topical antibiotic ointment on the penile lesion.  He has noted some improvement.  No purulent drainage.  Pain associated with the lesion has improved.  He voids 1-2 times per day since starting dialysis.  He reports no problems voiding at the present time.  He has a history of prostate cancer and is status post a robotic assisted laparoscopic prostatectomy approximately 10 years ago.  His most recent PSA from 6/21 was <0.1.  Past Medical History:  Past Medical History:  Diagnosis Date   Arthritis    lower back   Chronic back pain    Disc disease   Chronic kidney disease    Coronary atherosclerosis of native coronary artery    Coronary calcifications by chest CT, Myoview demonstrating inferolateral scar   Deafness in right ear    Diabetes mellitus    Diabetic retinopathy (HCC)    moderate nonproliferative with macular edema in right eye   Diastolic dysfunction 04/7988   Grade 1.   Essential hypertension, benign    Heart murmur    in past   HOH (hard of hearing)    Hyperkalemia    Kidney stones     Mixed hyperlipidemia    NAFLD (nonalcoholic fatty liver disease)    Pancreatic insufficiency    Diagnosed at [facility]   Prostate cancer [facility]) 2011   Shortness of breath dyspnea    occasional - liver pressing on right lung, decreased capacity   Subdural hematoma    Type 2 diabetes mellitus ([place])    Wears hearing aid    "crossover" form right to left    Past Surgical History:  Past Surgical History:  Procedure Laterality Date   APPENDECTOMY     AV FISTULA PLACEMENT Left 05/15/2021   Procedure: INSERTION OF LEFT ARM ARTERIOVENOUS (AV) GORE-TEX GRAFT;  Surgeon: Rosetta Posner, MD;  Location: [facility];  Service: Vascular;  Laterality: Left;   Bilateral hip replacement     CATARACT EXTRACTION W/PHACO Left 05/15/2015   Procedure: CATARACT EXTRACTION PHACO AND INTRAOCULAR LENS PLACEMENT ([place]);  Surgeon: Ronnell Freshwater, MD;  Location: [place];  Service: Ophthalmology;  Laterality: Left;  DIABETIC - insulin pump and oral meds   CATARACT EXTRACTION W/PHACO Right 08/28/2015   Procedure: CATARACT EXTRACTION PHACO AND INTRAOCULAR LENS PLACEMENT ([facility]);  Surgeon: Ronnell Freshwater, MD;  Location: [place];  Service: Ophthalmology;  Laterality: Right;  DIABETIC PER PT AND CINDY PLEASE KEEP ARRIVAL TIME AFTER 8AM    CHOLECYSTECTOMY     HERNIA REPAIR     INSERTION OF DIALYSIS  CATHETER Right 01/22/2021   Procedure: INSERTION OF DIALYSIS CATHETER;  Surgeon: Virl Cagey, MD;  Location: [facility];  Service: General;  Laterality: Right;   INSERTION OF DIALYSIS CATHETER Right 04/16/2021   Procedure: INSERTION OF DIALYSIS CATHETER;  Surgeon: Virl Cagey, MD;  Location: [facility];  Service: General;  Laterality: Right;   PROSTATECTOMY  2011    Allergies:  Allergies  Allergen Reactions   Enalapril Maleate Cough    Family History:  Family History  Problem Relation Age of Onset   Diabetes type II Mother    Heart attack Father     Social History:  Social  History   Tobacco Use   Smoking status: Never   Smokeless tobacco: Never  Vaping Use   Vaping Use: Never used  Substance Use Topics   Alcohol use: No   Drug use: No    Review of symptoms:  Constitutional:  Negative for unexplained weight loss, night sweats, fever, chills ENT:  Negative for nose bleeds, sinus pain, painful swallowing CV:  Negative for chest pain, shortness of breath, exercise intolerance, palpitations, loss of consciousness Resp:  Negative for cough, wheezing, shortness of breath GI:  Negative for nausea, vomiting, diarrhea, bloody stools GU:  Positives noted in HPI; otherwise negative for gross hematuria, dysuria, urinary incontinence Neuro:  Negative for seizures, poor balance, limb weakness, slurred speech Psych:  Negative for lack of energy, depression, anxiety Endocrine:  Negative for polydipsia, polyuria, symptoms of hypoglycemia (dizziness, hunger, sweating) Hematologic:  Negative for anemia, purpura, petechia, prolonged or excessive bleeding, use of anticoagulants  Allergic:  Negative for difficulty breathing or choking as a result of exposure to anything; no shellfish allergy; no allergic response (rash/itch) to materials, foods  Physical exam: BP 136/71   Pulse 73   Temp 98.7 F (37.1 C)  GENERAL APPEARANCE:  Well appearing, well developed, well nourished, NAD HEENT: Atraumatic, Normocephalic, oropharynx clear. NECK: Supple without lymphadenopathy or thyromegaly. LUNGS: Clear to auscultation bilaterally. HEART: Regular Rate and Rhythm without murmurs, gallops, or rubs. ABDOMEN: Soft, non-tender, No Masses. EXTREMITIES: Moves all extremities well.  Without clubbing, cyanosis, or edema. NEUROLOGIC:  Alert and oriented x 3, normal gait, CN II-XII grossly intact.  MENTAL STATUS:  Appropriate. BACK:  Non-tender to palpation.  No CVAT SKIN:  Warm, dry and intact.   GU: Penis:  circumcised, glans firm with fibrinous exudate and small central eshcar, no  purulence or sign of infection, minimally tender Meatus: patent Scrotum: normal, no masses   Results: None

## 2021-06-05 NOTE — Progress Notes (Signed)
Urological Symptom Review  Patient is experiencing the following symptoms: Stream starts and stops Weak stream Penile pain (male only)    Review of Systems  Gastrointestinal (upper)  : Negative for upper GI symptoms  Gastrointestinal (lower) : Negative for lower GI symptoms  Constitutional : Weight loss  Skin: Negative for skin symptoms  Eyes: Negative for eye symptoms  Ear/Nose/Throat : Negative for Ear/Nose/Throat symptoms  Hematologic/Lymphatic: Easy bruising  Cardiovascular : Negative for cardiovascular symptoms  Respiratory : Negative for respiratory symptoms  Endocrine: Negative for endocrine symptoms  Musculoskeletal: Back pain Joint pain  Neurological: Negative for neurological symptoms  Psychologic: Negative for psychiatric symptoms

## 2021-06-06 ENCOUNTER — Encounter: Payer: Self-pay | Admitting: Vascular Surgery

## 2021-06-06 ENCOUNTER — Ambulatory Visit (INDEPENDENT_AMBULATORY_CARE_PROVIDER_SITE_OTHER): Payer: PPO | Admitting: Vascular Surgery

## 2021-06-06 ENCOUNTER — Ambulatory Visit (INDEPENDENT_AMBULATORY_CARE_PROVIDER_SITE_OTHER): Payer: PPO

## 2021-06-06 VITALS — BP 128/65 | HR 79 | Temp 98.1°F | Resp 18

## 2021-06-06 DIAGNOSIS — L97901 Non-pressure chronic ulcer of unspecified part of unspecified lower leg limited to breakdown of skin: Secondary | ICD-10-CM

## 2021-06-06 DIAGNOSIS — D689 Coagulation defect, unspecified: Secondary | ICD-10-CM | POA: Diagnosis not present

## 2021-06-06 DIAGNOSIS — E1142 Type 2 diabetes mellitus with diabetic polyneuropathy: Secondary | ICD-10-CM

## 2021-06-06 DIAGNOSIS — Z992 Dependence on renal dialysis: Secondary | ICD-10-CM | POA: Diagnosis not present

## 2021-06-06 DIAGNOSIS — N186 End stage renal disease: Secondary | ICD-10-CM

## 2021-06-06 DIAGNOSIS — Z8679 Personal history of other diseases of the circulatory system: Secondary | ICD-10-CM

## 2021-06-06 DIAGNOSIS — E876 Hypokalemia: Secondary | ICD-10-CM | POA: Diagnosis not present

## 2021-06-06 DIAGNOSIS — D631 Anemia in chronic kidney disease: Secondary | ICD-10-CM | POA: Diagnosis not present

## 2021-06-06 DIAGNOSIS — N2581 Secondary hyperparathyroidism of renal origin: Secondary | ICD-10-CM | POA: Diagnosis not present

## 2021-06-06 NOTE — Progress Notes (Signed)
[facility] [place]  Patient name: Mark QUILES Sr. MRN: [id number] DOB: May 30, 1941 Sex: male  REASON FOR VISIT: Follow-up recent left upper arm AV Gore-Tex graft placement  HPI: Mark L Lapidus Sr. is a 80 y.o. male in our office today for follow-up.  He had been scheduled for a outpatient lower extremity arterial study from wound center.  I am seeing him to discuss this as well as follow-up of his recent left upper arm AV Gore-Tex graft placement on 05/15/2021.  He continues to have good functioning of his right IJ tunneled hemodialysis catheter  Current Outpatient Medications  Medication Sig Dispense Refill   aspirin EC 81 MG tablet Take 81 mg by mouth at bedtime.      calcitRIOL (ROCALTROL) 0.25 MCG capsule Take 0.25 mcg by mouth daily.     diltiazem (CARDIZEM CD) 240 MG 24 hr capsule Take 240 mg by mouth daily.     ferric gluconate 125 mg in sodium chloride 0.9 % 100 mL Inject 125 mg into the vein every Monday, Wednesday, and Friday with hemodialysis.     furosemide (LASIX) 40 MG tablet Take 40 mg by mouth daily.     HYDROcodone-acetaminophen (NORCO) 5-325 MG tablet Take 2 tablets by mouth every 6 (six) hours as needed for moderate pain. 120 tablet 0   insulin aspart (NOVOLOG) 100 UNIT/ML injection Inject 8 Units into the skin 3 (three) times daily with meals. (Patient taking differently: Inject 10 Units into the skin in the morning.) 10 mL 11   insulin glargine (LANTUS) 100 UNIT/ML injection Inject 0.12 mLs (12 Units total) into the skin at bedtime. (Patient taking differently: Inject 12 Units into the skin in the morning.) 10 mL 11   metoprolol tartrate (LOPRESSOR) 50 MG tablet Take 1 tablet (50 mg total) by mouth 2 (two) times daily. 180 tablet 1   Pancrelipase, Lip-Prot-Amyl, (ZENPEP) 10000-32000 units CPEP Take 1 capsule by mouth 3 (three) times daily with meals.     pravastatin (PRAVACHOL) 10 MG tablet TAKE 1 TABLET(10  MG) BY MOUTH DAILY WITH BREAKFAST 90 tablet 1   sevelamer carbonate (RENVELA) 800 MG tablet Take 2 tablets (1,600 mg total) by mouth 3 (three) times daily with meals. 180 tablet 11   sodium bicarbonate 650 MG tablet Take 650 mg by mouth daily.     No current facility-administered medications for this visit.     PHYSICAL EXAM: Vitals:   06/06/21 0840  BP: 128/65  Pulse: 79  Resp: 18  Temp: 98.1 F (36.7 C)  TempSrc: Temporal  SpO2: 97%    GENERAL: The patient is a well-nourished male, in no acute distress. The vital signs are documented above. Good healing of his antecubital and axillary incision in the left arm.  Excellent thrill and bruit in his upper arm AV graft on the left.  Palpable radial pulse with no steal symptoms in his hand  His noninvasive lower extremity studies today revealed severe calcification of his vessels therefore ankle arm index is not obtainable.  He does have biphasic waveforms at the posterior tibial bilaterally.  MEDICAL ISSUES: Good [name] result from left upper arm AV Gore-Tex graft.  He should be able to use this in 1 month.  Was placed on 05/15/2021.  Understands that he will use this for several sessions and then can have his catheter removed.  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  Appears to have adequate arterial flow for healing.  He will  see [place] again on an as-needed basis   Mark Posner, MD [facility] Tel [phone]  Note: Portions of this report may have been transcribed using voice recognition software.  Every effort has been made to ensure accuracy; however, inadvertent computerized transcription errors may still be present.

## 2021-06-07 ENCOUNTER — Encounter: Payer: PPO | Attending: Physician Assistant | Admitting: Physician Assistant

## 2021-06-07 ENCOUNTER — Other Ambulatory Visit: Payer: Self-pay

## 2021-06-07 DIAGNOSIS — Z992 Dependence on renal dialysis: Secondary | ICD-10-CM | POA: Diagnosis not present

## 2021-06-07 DIAGNOSIS — E1122 Type 2 diabetes mellitus with diabetic chronic kidney disease: Secondary | ICD-10-CM | POA: Diagnosis not present

## 2021-06-07 DIAGNOSIS — I2510 Atherosclerotic heart disease of native coronary artery without angina pectoris: Secondary | ICD-10-CM | POA: Diagnosis not present

## 2021-06-07 DIAGNOSIS — L98492 Non-pressure chronic ulcer of skin of other sites with fat layer exposed: Secondary | ICD-10-CM | POA: Diagnosis not present

## 2021-06-07 DIAGNOSIS — L97822 Non-pressure chronic ulcer of other part of left lower leg with fat layer exposed: Secondary | ICD-10-CM | POA: Insufficient documentation

## 2021-06-07 DIAGNOSIS — L97222 Non-pressure chronic ulcer of left calf with fat layer exposed: Secondary | ICD-10-CM | POA: Diagnosis not present

## 2021-06-07 DIAGNOSIS — N184 Chronic kidney disease, stage 4 (severe): Secondary | ICD-10-CM | POA: Diagnosis not present

## 2021-06-07 NOTE — Progress Notes (Addendum)
LACOREY, BRUSCA ([id number]) Visit Report for 06/07/2021 Chief Complaint Document Details Patient Name: [name], [name]. Date of Service: 06/07/2021 1:15 PM Medical Record Number: [id number] Patient Account Number: [id number] Date of Birth/Sex: 06/15/1941 (80 y.o. M) Treating RN: Carlene Coria Primary Care Provider: Jenna Luo Other Clinician: Referring Provider: Jenna Luo Treating Provider/Extender: Skipper Cliche in Treatment: 2 Information Obtained from: Patient Chief Complaint Left LE Calciphylaxis and Glans Penis Calciphylaxis Electronic Signature(s) Signed: 06/07/2021 2:17:19 PM By: Worthy Keeler PA-C Previous Signature: 06/07/2021 1:38:39 PM Version By: Worthy Keeler PA-C Entered By: Worthy Keeler on 06/07/2021 14:17:19 [name], [name] ([id number]) -------------------------------------------------------------------------------- Debridement Details Patient Name: Mark Morale L. Date of Service: 06/07/2021 1:15 PM Medical Record Number: [id number] Patient Account Number: [id number] Date of Birth/Sex: 26-Sep-1940 (80 y.o. M) Treating RN: Carlene Coria Primary Care Provider: Jenna Luo Other Clinician: Referring Provider: Jenna Luo Treating Provider/Extender: Skipper Cliche in Treatment: 2 Debridement Performed for Wound #2 Penis Assessment: Performed By: Physician Tommie Sams., PA-C Debridement Type: Chemical/Enzymatic/Mechanical Agent Used: Santyl Level of Consciousness (Pre- Awake and Alert procedure): Pre-procedure Verification/Time Out Yes - 14:14 Taken: Start Time: 14:14 Bleeding: None End Time: 14:16 Procedural Pain: 0 Post Procedural Pain: 0 Response to Treatment: Procedure was tolerated well Level of Consciousness (Post- Awake and Alert procedure): Post Debridement Measurements of Total Wound Length: (cm) 2 Width: (cm) 3 Depth: (cm) 0.1 Volume: (cm) 0.471 Character of Wound/Ulcer Post Debridement: Improved Post  Procedure Diagnosis Same as Pre-procedure Electronic Signature(s) Signed: 06/07/2021 2:45:21 PM By: Carlene Coria RN Signed: 06/07/2021 5:06:33 PM By: Worthy Keeler PA-C Entered By: Carlene Coria on 06/07/2021 14:45:21 [name] ([id number]) -------------------------------------------------------------------------------- HPI Details Patient Name: Mark Morale L. Date of Service: 06/07/2021 1:15 PM Medical Record Number: [id number] Patient Account Number: [id number] Date of Birth/Sex: [birth year] (80 y.o. 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 This has been present for a couple of months currently he is on the sodium thiosulfate at dialysis. He tells me that they picked up on this quickly and have been managing it but he really has not had any help with anything else from the standpoint of progressing the wound itself. The patient does have again end-stage renal disease which currently is listed as stage IV but he is on renal dialysis. He also has coronary artery disease as well as going shortly for formal arterial studies although he did have a quick test of the tibial artery with his vascular doctor when he went in to check on his fistula for his arm and subsequently they did not feel like he had any hemodynamically unstable flow into the lower extremity this is good news. Nonetheless I think that the biggest issue here is going to be getting some of the eschar off so that we get the wounds med headed in the appropriate direction. 05/31/2021 upon evaluation today patient appears to be doing well with regard to his  leg ulceration with calciphylaxis. I am very pleased in this regard. Fortunately there does not appear to be any signs of active infection at this time which is great news. No fevers, chills, nausea, vomiting, or diarrhea. 06/07/2021 upon evaluation today patient appears to be doing well  with regard to his legs. Unfortunately he is having an issue here with his penis as well he did mention at the end of last visit. I suggested that he go see urology. However when he went to see the urologist they basically told him there was not anything they could do. They recommended that we needed to be the ones to take care of this. With that being said this is an unusual area that to be honest a lot of the traditional wound care products we use are not really good to be good for this region. Potentially Medihoney alginate could be a possibility also think Santyl could be a possibility. With that being said I think initially my suggestion is probably can be for [place] to attempt the Santyl to see if that will benefit the patient. Electronic Signature(s) Signed: 06/07/2021 2:17:34 PM By: Worthy Keeler PA-C Entered By: Worthy Keeler on 06/07/2021 14:17:34 [name] ([id number]) -------------------------------------------------------------------------------- Physical Exam Details Patient Name: [name], [name] Date of Service: 06/07/2021 1:15 PM Medical Record Number: [id number] Patient Account Number: [id number] Date of Birth/Sex: [birth year] (80 y.o. M) Treating RN: Carlene Coria Primary Care Provider: Jenna Luo Other Clinician: Referring Provider: Jenna Luo Treating Provider/Extender: Skipper Cliche in Treatment: 2 Constitutional Well-nourished and well-hydrated in no acute distress. Respiratory normal breathing without difficulty. Psychiatric this patient is able to make decisions and demonstrates good insight into disease process. Alert and Oriented x 3. pleasant and  cooperative. Notes Upon inspection patient's wound bed actually showed signs of improvement in regard to his leg. I think the Dakin's moistened gauze dressing is doing quite well for him here. Fortunately I do not see any signs of infection at this time. With that being said I do feel like that the patient seems to be healing nicely and according to Dr. Donnetta Hutching he does have good arterial flow as well he was seen just yesterday in Dr. Marrion [name] office. Unfortunately with regard to the distal portion of the penis and the glans extending down to the meatus and urethral opening he unfortunately does have evidence of necrotic tissue and again I think this is probably consistent with calciphylaxis as well. Electronic Signature(s) Signed: 06/07/2021 2:18:30 PM By: Worthy Keeler PA-C Entered By: Worthy Keeler on 06/07/2021 14:18:29 [place], Vergia [name] ([id number]) -------------------------------------------------------------------------------- Physician Orders Details Patient Name: Mark Morale L. Date of Service: 06/07/2021 1:15 PM Medical Record Number: [id number] Patient Account Number: [id number] Date of Birth/Sex: June 25, 1941 (80 y.o. M) Treating RN: Carlene Coria Primary Care Provider: Jenna Luo Other Clinician: Referring Provider: Jenna Luo Treating Provider/Extender: Skipper Cliche in Treatment: 2 Verbal / Phone Orders: No Diagnosis Coding ICD-10 Coding Code Description E83.50 Unspecified disorder of calcium metabolism L97.822 Non-pressure chronic ulcer of other part of left lower leg with fat layer exposed L98.492 Non-pressure chronic ulcer of skin of other sites with fat layer exposed Z99.2 Dependence on renal dialysis N18.4 Chronic kidney disease, stage 4 (severe) I25.10 Atherosclerotic heart disease of native coronary artery without angina pectoris Follow-up Appointments o Return Appointment in 1 week. Edema Control - Lymphedema / Segmental Compressive Device /  Other o Elevate, Exercise Daily and Avoid Standing for Long Periods of Time. o Elevate legs to the level of the heart and pump ankles as often as possible o Elevate leg(s) parallel to the floor when sitting. Wound Treatment Wound #1 - Lower Leg Wound Laterality: Left, Posterior Primary Dressing: Kerlix AMD Roll Dressing, 4.5x 4.1 (in/in) (Generic) 1 x Per Day/30 Days  Discharge Instructions: moisten with normal saline Secondary Dressing: Kerlix 4.5 x 4.1 (in/yd) (Generic) 1 x Per Day/30 Days Discharge Instructions: Apply Kerlix 4.5 x 4.1 (in/yd) as instructed Secured With: 41M Medipore H Soft Cloth Surgical Tape, 2x2 (in/yd) (Generic) 1 x Per Day/30 Days Wound #2 - Penis Primary Dressing: Gauze 1 x Per Day/30 Days Discharge Instructions: moistened with santyl Primary Dressing: Santyl Collagenase Ointment, 30 (gm), tube 1 x Per Day/30 Days Patient Medications Allergies: enalapril Notifications Medication Indication Start End Santyl 06/07/2021 DOSE topical 250 unit/gram ointment - ointment topical Apply nickel thick daily to the wound bed and then cover with a dressing as directed in clinic x 30 days Electronic Signature(s) Signed: 06/07/2021 2:33:15 PM By: Worthy Keeler PA-C Entered By: Worthy Keeler on 06/07/2021 14:33:14 Rihn, Cleve L. ([id number]) -------------------------------------------------------------------------------- Problem List Details Patient Name: Mark Morale L. Date of Service: 06/07/2021 1:15 PM Medical Record Number: [id number] Patient Account Number: [id number] Date of Birth/Sex: 1940-10-26 (80 y.o. M) Treating RN: Carlene Coria Primary Care Provider: Jenna Luo Other Clinician: Referring Provider: Jenna Luo Treating Provider/Extender: Skipper Cliche in Treatment: 2 Active Problems ICD-10 Encounter Code Description Active Date MDM Diagnosis E83.50 Unspecified disorder of calcium metabolism 05/24/2021 No Yes L97.822 Non-pressure  chronic ulcer of other part of left lower leg with fat layer 05/24/2021 No Yes exposed L98.492 Non-pressure chronic ulcer of skin of other sites with fat layer exposed 06/07/2021 No Yes Z99.2 Dependence on renal dialysis 05/24/2021 No Yes N18.4 Chronic kidney disease, stage 4 (severe) 05/24/2021 No Yes I25.10 Atherosclerotic heart disease of native coronary artery without angina 05/24/2021 No Yes pectoris Inactive Problems Resolved Problems Electronic Signature(s) Signed: 06/07/2021 2:16:30 PM By: Worthy Keeler PA-C Previous Signature: 06/07/2021 1:37:10 PM Version By: Worthy Keeler PA-C Entered By: Worthy Keeler on 06/07/2021 14:16:29 Student, Miguel L. ([id number]) -------------------------------------------------------------------------------- Progress Note Details Patient Name: Schrader, Sherill L. Date of Service: 06/07/2021 1:15 PM Medical Record Number: [id number] Patient Account Number: [id number] Date of Birth/Sex: [birth year] (80 y.o. 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 This has been present for a couple of months currently he is on the sodium thiosulfate at dialysis. He tells me that they picked up on this quickly and have been managing it but he really has not had any help with anything else from the standpoint of progressing the wound itself. The patient does have again end-stage renal disease which currently is listed as stage IV but he is on renal dialysis. He also has coronary artery disease as  well as going shortly for formal arterial studies although he did have a quick test of the tibial artery with his vascular doctor when he went in to check on his fistula for his arm and subsequently they did not feel like he had any hemodynamically unstable flow into the lower extremity this is good news. Nonetheless I think that the biggest issue here is going to be getting some of the eschar off so that we get the wounds med headed in the appropriate direction. 05/31/2021 upon evaluation today patient appears to be doing well with regard to his leg ulceration with  calciphylaxis. I am very pleased in this regard. Fortunately there does not appear to be any signs of active infection at this time which is great news. No fevers, chills, nausea, vomiting, or diarrhea. 06/07/2021 upon evaluation today patient appears to be doing well with regard to his legs. Unfortunately he is having an issue here with his penis as well he did mention at the end of last visit. I suggested that he go see urology. However when he went to see the urologist they basically told him there was not anything they could do. They recommended that we needed to be the ones to take care of this. With that being said this is an unusual area that to be honest a lot of the traditional wound care products we use are not really good to be good for this region. Potentially Medihoney alginate could be a possibility also think Santyl could be a possibility. With that being said I think initially my suggestion is probably can be for [place] to attempt the Santyl to see if that will benefit the patient. Objective Constitutional Well-nourished and well-hydrated in no acute distress. Vitals Time Taken: 1:22 PM, Height: 68 in, Weight: 172 lbs, BMI: 26.1, Temperature: 98.3 F, Pulse: 76 bpm, Respiratory Rate: 20 breaths/min, Blood Pressure: 149/72 mmHg. Respiratory normal breathing without difficulty. Psychiatric this patient is able to make  decisions and demonstrates good insight into disease process. Alert and Oriented x 3. pleasant and cooperative. General Notes: Upon inspection patient's wound bed actually showed signs of improvement in regard to his leg. I think the Dakin's moistened gauze dressing is doing quite well for him here. Fortunately I do not see any signs of infection at this time. With that being said I do feel like that the patient seems to be healing nicely and according to Dr. Donnetta Hutching he does have good arterial flow as well he was seen just yesterday in Dr. Marrion [name] office. Unfortunately with regard to the distal portion of the penis and the glans extending down to the meatus and urethral opening he unfortunately does have evidence of necrotic tissue and again I think this is probably consistent with calciphylaxis as well. Integumentary (Hair, Skin) Wound #1 status is Open. Original cause of wound was Gradually Appeared. The date acquired was: 04/05/2021. The wound has been in treatment 2 weeks. The wound is located on the Left,Posterior Lower Leg. The wound measures 20cm length x 11cm width x 0.4cm depth; 172.788cm^2 area and 69.115cm^3 volume. There is Fat Layer (Subcutaneous Tissue) exposed. There is no tunneling or undermining noted. There is a medium amount of serosanguineous drainage noted. There is small (1-33%) red, pink granulation within the wound bed. There is a large (67-100%) amount of necrotic tissue within the wound bed including Eschar and Adherent Slough. Wound #2 status is Open. Original cause of wound was Gradually Appeared. The date acquired was: 05/05/2021. The wound is located on the [place], Netanel L. ([id number]) Penis. The wound measures 2cm length x 3cm width x 0.1cm depth; 4.712cm^2 area and 0.471cm^3 volume. There is no tunneling or undermining noted. There is a medium amount of serosanguineous drainage noted. There is a large (67-100%) amount of necrotic tissue within the wound bed including  Eschar and Adherent Slough. Assessment Active Problems ICD-10 Unspecified disorder of calcium metabolism Non-pressure chronic ulcer of other part of left lower leg with fat layer exposed Non-pressure chronic ulcer of skin of other sites with fat layer exposed Dependence on renal dialysis Chronic kidney disease, stage 4 (severe)  Atherosclerotic heart disease of native coronary artery without angina pectoris Plan Follow-up Appointments: Return Appointment in 1 week. Edema Control - Lymphedema / Segmental Compressive Device / Other: Elevate, Exercise Daily and Avoid Standing for Long Periods of Time. Elevate legs to the level of the heart and pump ankles as often as possible Elevate leg(s) parallel to the floor when sitting. The following medication(s) was prescribed: Santyl topical 250 unit/gram ointment ointment topical Apply nickel thick daily to the wound bed and then cover with a dressing as directed in clinic x 30 days starting 06/07/2021 WOUND #1: - Lower Leg Wound Laterality: Left, Posterior Primary Dressing: Kerlix AMD Roll Dressing, 4.5x 4.1 (in/in) (Generic) 1 x Per Day/30 Days Discharge Instructions: moisten with normal saline Secondary Dressing: Kerlix 4.5 x 4.1 (in/yd) (Generic) 1 x Per Day/30 Days Discharge Instructions: Apply Kerlix 4.5 x 4.1 (in/yd) as instructed Secured With: 97M Medipore H Soft Cloth Surgical Tape, 2x2 (in/yd) (Generic) 1 x Per Day/30 Days WOUND #2: - Penis Wound Laterality: Primary Dressing: Gauze 1 x Per Day/30 Days Discharge Instructions: moistened with santyl Primary Dressing: Santyl Collagenase Ointment, 30 (gm), tube 1 x Per Day/30 Days 1. Based on what I am seeing I would recommend at this time that we actually go ahead and continue with the Dakin's moistened gauze for the legs I think that is doing great on this left leg area in regard to these wounds. 2. I am also can recommend that we have the patient continue with the Dakin's moistened gauze  in particular which I think is doing great. 3. With regard to the glans penis I think this is good to be the area where Santyl may be more appropriate I think that is can to be the safest and gentlest thing that we can do Medihoney alginate could be another option as well. 4. I would recommend that if things not getting better in regard to the penis area that the patient contact our office and let me know ASAP if anything worsens otherwise if is not seeing improvement in the next several weeks we will see where things go and possibly refer him to surgery. We will see patient back for reevaluation in 1 week here in the clinic. If anything worsens or changes patient will contact our office for additional recommendations. Electronic Signature(s) Signed: 06/07/2021 2:34:12 PM By: Worthy Keeler PA-C Previous Signature: 06/07/2021 2:19:58 PM Version By: Worthy Keeler PA-C Entered By: Worthy Keeler on 06/07/2021 14:34:12 Peltzer, Valgene L. ([id number]) -------------------------------------------------------------------------------- SuperBill Details Patient Name: Mark Morale L. Date of Service: 06/07/2021 Medical Record Number: [id number] Patient Account Number: [id number] Date of Birth/Sex: May 18, 1941 (80 y.o. M) Treating RN: Carlene Coria Primary Care Provider: Jenna Luo Other Clinician: Referring Provider: Jenna Luo Treating Provider/Extender: Skipper Cliche in Treatment: 2 Diagnosis Coding ICD-10 Codes Code Description E83.50 Unspecified disorder of calcium metabolism L97.822 Non-pressure chronic ulcer of other part of left lower leg with fat layer exposed L98.492 Non-pressure chronic ulcer of skin of other sites with fat layer exposed Z99.2 Dependence on renal dialysis N18.4 Chronic kidney disease, stage 4 (severe) I25.10 Atherosclerotic heart disease of native coronary artery without angina pectoris Facility Procedures CPT4 Code: [id number] Description: 70177 -  DEBRIDE W/O ANES NON SELECT Modifier: Quantity: 1 Physician Procedures CPT4 Code: 9390300 Description: 99214 - WC PHYS LEVEL 4 - EST PT Modifier: Quantity: 1 CPT4 Code: Description: ICD-10 Diagnosis Description E83.50 Unspecified disorder of calcium metabolism L97.822 Non-pressure chronic ulcer of other part of left lower leg  with fat lay L98.492 Non-pressure chronic ulcer of skin of other sites with fat layer expose  Z99.2 Dependence on renal dialysis Modifier: er exposed d Quantity: Electronic Signature(s) Signed: 06/07/2021 5:06:33 PM By: Worthy Keeler PA-C Signed: 06/07/2021 5:07:02 PM By: Carlene Coria RN Previous Signature: 06/07/2021 2:47:28 PM Version By: Carlene Coria RN Previous Signature: 06/07/2021 2:21:07 PM Version By: Worthy Keeler PA-C Entered By: Carlene Coria on 06/07/2021 33:35:45

## 2021-06-08 NOTE — Progress Notes (Signed)
TORRION, WITTER ([id number]) Visit Report for 06/07/2021 Arrival Information Details Patient Name: [name], [name]. Date of Service: 06/07/2021 1:15 PM Medical Record Number: [id number] Patient Account Number: [id number] Date of Birth/Sex: November 07, 1940 (80 y.o. M) Treating RN: Carlene Coria Primary Care Shammond Arave: Jenna Luo Other Clinician: Referring Meril Dray: Jenna Luo Treating Mylez Venable/Extender: Skipper Cliche in Treatment: 2 Visit Information History Since Last Visit All ordered tests and consults were completed: No Patient Arrived: Wheel Chair Added or deleted any medications: No Arrival Time: 13:18 Any new allergies or adverse reactions: No Accompanied By: wife Had a fall or experienced change in No Transfer Assistance: None activities of daily living that may affect Patient Identification Verified: Yes risk of falls: Secondary Verification Process Completed: Yes Signs or symptoms of abuse/neglect since last visito No Patient Requires Transmission-Based Precautions: No Hospitalized since last visit: No Patient Has Alerts: No Implantable device outside of the clinic excluding No cellular tissue based products placed in the center since last visit: Has Dressing in Place as Prescribed: Yes Pain Present Now: Yes Electronic Signature(s) Signed: 06/07/2021 5:07:02 PM By: Carlene Coria RN Entered By: Carlene Coria on 06/07/2021 13:22:25 [name], [name]. ([id number]) -------------------------------------------------------------------------------- Clinic Level of Care Assessment Details Patient Name: [name]. Date of Service: 06/07/2021 1:15 PM Medical Record Number: [id number] Patient Account Number: [id number] Date of Birth/Sex: 1941-05-15 (80 y.o. M) Treating RN: Carlene Coria Primary Care Jakarie Pember: Jenna Luo Other Clinician: Referring Adrieana Fennelly: Jenna Luo Treating Brek Reece/Extender: Skipper Cliche in Treatment: 2 Clinic Level of Care  Assessment Items TOOL 1 Quantity Score []  - Use when EandM and Procedure is performed on INITIAL visit 0 ASSESSMENTS - Nursing Assessment / Reassessment []  - General Physical Exam (combine w/ comprehensive assessment (listed just below) when performed on new 0 pt. evals) []  - 0 Comprehensive Assessment (HX, ROS, Risk Assessments, Wounds Hx, etc.) ASSESSMENTS - Wound and Skin Assessment / Reassessment []  - Dermatologic / Skin Assessment (not related to wound area) 0 ASSESSMENTS - Ostomy and/or Continence Assessment and Care []  - Incontinence Assessment and Management 0 []  - 0 Ostomy Care Assessment and Management (repouching, etc.) PROCESS - Coordination of Care []  - Simple Patient / Family Education for ongoing care 0 []  - 0 Complex (extensive) Patient / Family Education for ongoing care []  - 0 Staff obtains Programmer, systems, Records, Test Results / Process Orders []  - 0 Staff telephones HHA, Nursing Homes / Clarify orders / etc []  - 0 Routine Transfer to another Facility (non-emergent condition) []  - 0 Routine Hospital Admission (non-emergent condition) []  - 0 New Admissions / Biomedical engineer / Ordering NPWT, Apligraf, etc. []  - 0 Emergency Hospital Admission (emergent condition) PROCESS - Special Needs []  - Pediatric / Minor Patient Management 0 []  - 0 Isolation Patient Management []  - 0 Hearing / Language / Visual special needs []  - 0 Assessment of Community assistance (transportation, D/C planning, etc.) []  - 0 Additional assistance / Altered mentation []  - 0 Support Surface(s) Assessment (bed, cushion, seat, etc.) INTERVENTIONS - Miscellaneous []  - External ear exam 0 []  - 0 Patient Transfer (multiple staff / Civil Service fast streamer / Similar devices) []  - 0 Simple Staple / Suture removal (25 or less) []  - 0 Complex Staple / Suture removal (26 or more) []  - 0 Hypo/Hyperglycemic Management (do not check if billed separately) []  - 0 Ankle / Brachial Index (ABI) - do not  check if billed separately Has the patient been seen at the hospital within the last three years: Yes Total Score: 0  Level Of Care: ____ [name] ([id number]) Electronic Signature(s) Signed: 06/07/2021 5:07:02 PM By: Carlene Coria RN Entered By: Carlene Coria on 06/07/2021 14:45:33 [name], [name] Kitchen ([id number]) -------------------------------------------------------------------------------- Encounter Discharge Information Details Patient Name: [name], [name]. Date of Service: 06/07/2021 1:15 PM Medical Record Number: [id number] Patient Account Number: [id number] Date of Birth/Sex: Jul 05, 1941 (80 y.o. M) Treating RN: Carlene Coria Primary Care Dionna Wiedemann: Jenna Luo Other Clinician: Referring Phinley Schall: Jenna Luo Treating Hermon Zea/Extender: Skipper Cliche in Treatment: 2 Encounter Discharge Information Items Post Procedure Vitals Discharge Condition: Stable Temperature (F): 98.6 Ambulatory Status: Wheelchair Pulse (bpm): 73 Discharge Destination: Home Respiratory Rate (breaths/min): 18 Transportation: Private Auto Blood Pressure (mmHg): 146/78 Accompanied By: self Schedule Follow-up Appointment: Yes Clinical Summary of Care: Patient Declined Electronic Signature(s) Signed: 06/07/2021 2:49:10 PM By: Carlene Coria RN Entered By: Carlene Coria on 06/07/2021 14:49:09 [name], [name]. ([id number]) -------------------------------------------------------------------------------- Lower Extremity Assessment Details Patient Name: [name], [name]. Date of Service: 06/07/2021 1:15 PM Medical Record Number: [id number] Patient Account Number: [id number] Date of Birth/Sex: 20-Feb-1941 (80 y.o. M) Treating RN: Carlene Coria Primary Care Wealthy Danielski: Jenna Luo Other Clinician: Referring Hilton Saephan: Jenna Luo Treating Quantel Mcinturff/Extender: Skipper Cliche in Treatment: 2 Edema Assessment Assessed: [Left: No] [Right: No] Edema: [Left: Ye] [Right: s] Calf Left:  Right: Point of Measurement: 34 cm From Medial Instep 33 cm Ankle Left: Right: Point of Measurement: 9 cm From Medial Instep 20 cm Vascular Assessment Pulses: Dorsalis Pedis Palpable: [Left:Yes] Electronic Signature(s) Signed: 06/07/2021 5:07:02 PM By: Carlene Coria RN Entered By: Carlene Coria on 06/07/2021 13:30:24 [name], [name]. ([id number]) -------------------------------------------------------------------------------- Multi Wound Chart Details Patient Name: [name]. Date of Service: 06/07/2021 1:15 PM Medical Record Number: [id number] Patient Account Number: [id number] Date of Birth/Sex: April 18, 1941 (80 y.o. M) Treating RN: Carlene Coria Primary Care Mckenlee Mangham: Jenna Luo Other Clinician: Referring Hager Compston: Jenna Luo Treating Chrysten Woulfe/Extender: Skipper Cliche in Treatment: 2 Vital Signs Height(in): 68 Pulse(bpm): 76 Weight(lbs): 172 Blood Pressure(mmHg): 149/72 Body Mass Index(BMI): 26 Temperature(F): 98.3 Respiratory Rate(breaths/min): 20 Photos: [N/A:N/A] Wound Location: Left, Posterior Lower Leg N/A N/A Wounding Event: Gradually Appeared N/A N/A Primary Etiology: Calciphylaxis N/A N/A Comorbid History: Coronary Artery Disease, Type II N/A N/A Diabetes Date Acquired: 04/05/2021 N/A N/A Weeks of Treatment: 2 N/A N/A Wound Status: Open N/A N/A Clustered Wound: Yes N/A N/A Measurements [name] x W x D (cm) 20x11x0.4 N/A N/A Area (cm) : 172.788 N/A N/A Volume (cm) : 69.115 N/A N/A % Reduction in Area: 0.00% N/A N/A % Reduction in Volume: 0.00% N/A N/A Classification: Full Thickness Without Exposed N/A N/A Support Structures Exudate Amount: Medium N/A N/A Exudate Type: Serosanguineous N/A N/A Exudate Color: red, brown N/A N/A Granulation Amount: Small (1-33%) N/A N/A Granulation Quality: Red, Pink N/A N/A Necrotic Amount: Large (67-100%) N/A N/A Necrotic Tissue: Eschar, Adherent Slough N/A N/A Exposed Structures: Fat Layer (Subcutaneous  Tissue): N/A N/A Yes Fascia: No Tendon: No Muscle: No Joint: No Bone: No Epithelialization: None N/A N/A Treatment Notes Electronic Signature(s) Signed: 06/07/2021 5:07:02 PM By: Carlene Coria RN Entered By: Carlene Coria on 06/07/2021 13:53:03 [name], [name]. ([id number]) -------------------------------------------------------------------------------- [place] Details Patient Name: [name]. Date of Service: 06/07/2021 1:15 PM Medical Record Number: [id number] Patient Account Number: [id number] Date of Birth/Sex: 1941-01-15 (80 y.o. M) Treating RN: Carlene Coria Primary Care Shatira Dobosz: Jenna Luo Other Clinician: Referring Rease Wence: Jenna Luo Treating Charma Mocarski/Extender: Skipper Cliche in Treatment: 2 Active Inactive Abuse / Safety / Falls / Self Care Management Nursing Diagnoses: Potential for injury  related to falls Goals: Patient will remain injury free related to falls Date Initiated: 05/24/2021 Target Resolution Date: 06/24/2021 Goal Status: Active Interventions: Assess Activities of Daily Living upon admission and as needed Assess fall risk on admission and as needed Assess: immobility, friction, shearing, incontinence upon admission and as needed Assess impairment of mobility on admission and as needed per policy Assess personal safety and home safety (as indicated) on admission and as needed Assess self care needs on admission and as needed Notes: Nutrition Nursing Diagnoses: Impaired glucose control: actual or potential Goals: Patient/caregiver agrees to and verbalizes understanding of need to use nutritional supplements and/or vitamins as prescribed Date Initiated: 05/24/2021 Target Resolution Date: 06/25/2021 Goal Status: Active Interventions: Assess HgA1c results as ordered upon admission and as needed Assess patient nutrition upon admission and as needed per policy Notes: Wound/Skin Impairment Nursing  Diagnoses: Knowledge deficit related to ulceration/compromised skin integrity Goals: Patient/caregiver will verbalize understanding of skin care regimen Date Initiated: 05/24/2021 Target Resolution Date: 06/25/2021 Goal Status: Active Ulcer/skin breakdown will have a volume reduction of 30% by week 4 Date Initiated: 05/24/2021 Target Resolution Date: 07/24/2021 Goal Status: Active Ulcer/skin breakdown will have a volume reduction of 50% by week 8 Date Initiated: 05/24/2021 Target Resolution Date: 08/24/2021 Goal Status: Active Ulcer/skin breakdown will have a volume reduction of 80% by week 12 Date Initiated: 05/24/2021 Target Resolution Date: 09/24/2021 MAKIAH, CLAUSON ([id number]) Goal Status: Active Ulcer/skin breakdown will heal within 14 weeks Date Initiated: 05/24/2021 Target Resolution Date: 10/22/2021 Goal Status: Active Interventions: Assess patient/caregiver ability to obtain necessary supplies Assess patient/caregiver ability to perform ulcer/skin care regimen upon admission and as needed Assess ulceration(s) every visit Notes: Electronic Signature(s) Signed: 06/07/2021 5:07:02 PM By: Carlene Coria RN Entered By: Carlene Coria on 06/07/2021 13:52:35 Starks, Jac [name]. ([id number]) -------------------------------------------------------------------------------- Pain Assessment Details Patient Name: [name]. Date of Service: 06/07/2021 1:15 PM Medical Record Number: [id number] Patient Account Number: [id number] Date of Birth/Sex: 06/10/1941 (80 y.o. M) Treating RN: Carlene Coria Primary Care Laylee Schooley: Jenna Luo Other Clinician: Referring Maclovio Henson: Jenna Luo Treating Presley Summerlin/Extender: Skipper Cliche in Treatment: 2 Active Problems Location of Pain Severity and Description of Pain Patient Has Paino Yes Site Locations With Dressing Change: Yes Duration of the Pain. Constant / Intermittento Intermittent How Long Does it Lasto Hours: Minutes:  15 Rate the pain. Current Pain Level: 7 Worst Pain Level: 10 Least Pain Level: 0 Tolerable Pain Level: 5 Character of Pain Describe the Pain: Aching, Burning Pain Management and Medication Current Pain Management: Medication: Yes Cold Application: No Rest: Yes Massage: No Activity: No T.E.N.S.: No Heat Application: No Leg drop or elevation: No Is the Current Pain Management Adequate: Inadequate How does your wound impact your activities of daily livingo Sleep: Yes Bathing: No Appetite: No Relationship With Others: No Bladder Continence: No Emotions: No Bowel Continence: No Work: No Toileting: No Drive: No Dressing: No Hobbies: No Electronic Signature(s) Signed: 06/07/2021 5:07:02 PM By: Carlene Coria RN Entered By: Carlene Coria on 06/07/2021 13:23:52 [name], [name]. ([id number]) -------------------------------------------------------------------------------- Patient/Caregiver Education Details Patient Name: [name]. Date of Service: 06/07/2021 1:15 PM Medical Record Number: [id number] Patient Account Number: [id number] Date of Birth/Gender: 1940-10-12 (80 y.o. M) Treating RN: Carlene Coria Primary Care Physician: Jenna Luo Other Clinician: Referring Physician: Jenna Luo Treating Physician/Extender: Skipper Cliche in Treatment: 2 Education Assessment Education Provided To: Patient Education Topics Provided Wound/Skin Impairment: Methods: Explain/Verbal Responses: State content correctly Electronic Signature(s) Signed: 06/07/2021 5:07:02 PM By: Carlene Coria RN Entered  ByCarlene Coria on 06/07/2021 16:28:53 [name], [name] ([id number]) -------------------------------------------------------------------------------- Wound Assessment Details Patient Name: [name], [name]. Date of Service: 06/07/2021 1:15 PM Medical Record Number: [id number] Patient Account Number: [id number] Date of Birth/Sex: 02/14/1941 (80 y.o. M) Treating RN: Carlene Coria Primary Care Buckley Bradly: Jenna Luo Other Clinician: Referring Zhania Shaheen: Jenna Luo Treating Demetria Lightsey/Extender: Skipper Cliche in Treatment: 2 Wound Status Wound Number: 1 Primary Etiology: Calciphylaxis Wound Location: Left, Posterior Lower Leg Wound Status: Open Wounding Event: Gradually Appeared Comorbid History: Coronary Artery Disease, Type II Diabetes Date Acquired: 04/05/2021 Weeks Of Treatment: 2 Clustered Wound: Yes Photos Wound Measurements Length: (cm) 20 Width: (cm) 11 Depth: (cm) 0.4 Area: (cm) 172.788 Volume: (cm) 69.115 % Reduction in Area: 0% % Reduction in Volume: 0% Epithelialization: None Tunneling: No Undermining: No Wound Description Classification: Full Thickness Without Exposed Support Structu Exudate Amount: Medium Exudate Type: Serosanguineous Exudate Color: red, brown res Foul Odor After Cleansing: No Slough/Fibrino Yes Wound Bed Granulation Amount: Small (1-33%) Exposed Structure Granulation Quality: Red, Pink Fascia Exposed: No Necrotic Amount: Large (67-100%) Fat Layer (Subcutaneous Tissue) Exposed: Yes Necrotic Quality: Eschar, Adherent Slough Tendon Exposed: No Muscle Exposed: No Joint Exposed: No Bone Exposed: No Treatment Notes Wound #1 (Lower Leg) Wound Laterality: Left, Posterior Cleanser Peri-Wound Care Topical Primary Dressing [name], [name]. ([id number]) Kerlix AMD Roll Dressing, 4.5x 4.1 (in/in) Discharge Instruction: moisten with normal saline Secondary Dressing Kerlix 4.5 x 4.1 (in/yd) Discharge Instruction: Apply Kerlix 4.5 x 4.1 (in/yd) as instructed Secured With 7M Medipore H Soft Cloth Surgical Tape, 2x2 (in/yd) Compression Wrap Compression Stockings Add-Ons Electronic Signature(s) Signed: 06/07/2021 5:07:02 PM By: Carlene Coria RN Entered By: Carlene Coria on 06/07/2021 13:29:36 [name], [name].  ([id number]) -------------------------------------------------------------------------------- Wound Assessment Details Patient Name: Vanderhoef, Faisal [name]. Date of Service: 06/07/2021 1:15 PM Medical Record Number: [id number] Patient Account Number: [id number] Date of Birth/Sex: 01/30/1941 (80 y.o. M) Treating RN: Carlene Coria Primary Care Raylyn Carton: Jenna Luo Other Clinician: Referring Saara Kijowski: Jenna Luo Treating Chetan Mehring/Extender: Skipper Cliche in Treatment: 2 Wound Status Wound Number: 2 Primary Etiology: Calciphylaxis Wound Location: Penis Wound Status: Open Wounding Event: Gradually Appeared Comorbid History: Coronary Artery Disease, Type II Diabetes Date Acquired: 05/05/2021 Weeks Of Treatment: 0 Clustered Wound: No Photos Wound Measurements Length: (cm) 2 Width: (cm) 3 Depth: (cm) 0.1 Area: (cm) 4.712 Volume: (cm) 0.471 % Reduction in Area: % Reduction in Volume: Epithelialization: None Tunneling: No Undermining: No Wound Description Classification: Full Thickness Without Exposed Support Struc Exudate Amount: Medium Exudate Type: Serosanguineous Exudate Color: red, brown tures Foul Odor After Cleansing: No Slough/Fibrino Yes Wound Bed Necrotic Amount: Large (67-100%) Exposed Structure Necrotic Quality: Eschar, Adherent Slough Fascia Exposed: No Fat Layer (Subcutaneous Tissue) Exposed: No Tendon Exposed: No Muscle Exposed: No Joint Exposed: No Bone Exposed: No Treatment Notes Wound #2 (Penis) Cleanser Peri-Wound Care Topical Primary Dressing Handrich, Martel [name]. ([id number]) Gauze Discharge Instruction: moistened with santyl Santyl Collagenase Ointment, 30 (gm), tube Secondary Dressing Secured With Compression Wrap Compression Stockings Add-Ons Electronic Signature(s) Signed: 06/07/2021 5:07:02 PM By: Carlene Coria RN Entered By: Carlene Coria on 06/07/2021 14:21:29 Sutherlin, Jaxxson [name].  ([id number]) -------------------------------------------------------------------------------- Vitals Details Patient Name: [name]. Date of Service: 06/07/2021 1:15 PM Medical Record Number: [id number] Patient Account Number: [id number] Date of Birth/Sex: April 01, 1941 (80 y.o. M) Treating RN: Carlene Coria Primary Care Rama Sorci: Jenna Luo Other Clinician: Referring Topanga Alvelo: Jenna Luo Treating Malvern Kadlec/Extender: Skipper Cliche in Treatment: 2 Vital Signs Time Taken: 13:22 Temperature (F): 98.3 Height (in): 68 Pulse (bpm): 76  Weight (lbs): 172 Respiratory Rate (breaths/min): 20 Body Mass Index (BMI): 26.1 Blood Pressure (mmHg): 149/72 Reference Range: 80 - 120 mg / dl Electronic Signature(s) Signed: 06/07/2021 5:07:02 PM By: Carlene Coria RN Entered By: Carlene Coria on 06/07/2021 13:22:49

## 2021-06-11 ENCOUNTER — Encounter: Payer: Self-pay | Admitting: Family Medicine

## 2021-06-11 DIAGNOSIS — N2581 Secondary hyperparathyroidism of renal origin: Secondary | ICD-10-CM | POA: Diagnosis not present

## 2021-06-11 DIAGNOSIS — E876 Hypokalemia: Secondary | ICD-10-CM | POA: Diagnosis not present

## 2021-06-11 DIAGNOSIS — N186 End stage renal disease: Secondary | ICD-10-CM | POA: Diagnosis not present

## 2021-06-11 DIAGNOSIS — Z992 Dependence on renal dialysis: Secondary | ICD-10-CM | POA: Diagnosis not present

## 2021-06-11 DIAGNOSIS — D631 Anemia in chronic kidney disease: Secondary | ICD-10-CM | POA: Diagnosis not present

## 2021-06-11 DIAGNOSIS — D689 Coagulation defect, unspecified: Secondary | ICD-10-CM | POA: Diagnosis not present

## 2021-06-11 MED ORDER — HYDROCODONE-ACETAMINOPHEN 5-325 MG PO TABS: 2.0000 | ORAL_TABLET | Freq: Four times a day (QID) | ORAL | 0 refills | Status: DC | PRN

## 2021-06-11 NOTE — Telephone Encounter (Signed)
Ok to refill??  Last office visit 05/08/2021.  Last refill 05/31/2021.

## 2021-06-11 NOTE — Progress Notes (Deleted)
Cardiology Office Note    Date:  06/11/2021   ID:  Mark Jumbo Sr., DOB 06/13/1941, MRN [id number]  PCP:  [name], MD  Cardiologist: Rozann Lesches, MD    No chief complaint on file.   History of Present Illness:    Mark COOPMAN Sr. is a 80 y.o. male with past medical history of CAD (coronary calcifications by prior CT with low-risk NST in 2013 - no known intervention), HTN, HLD, Type 2 DM and Stage 3 CKD who presents to the office today for 11-monthfollow-up.   He was last exam by myself in 02/2021 following a recent admission for altered mental status in the setting of an AKI and COVID-19.  He did have episodes of SVT during admission but no definitive atrial fibrillation or flutter.  At the time of his visit, he reported working with physical therapy at rehab and was starting to make some progress and was hopeful to return home within the next month. He did report having palpitations and heart rate would become elevated to the 140's when working with PT, therefore Cardizem CD was titrated from 180 mg daily to 240 mg daily with him being continued on Lopressor 50 mg twice daily. Was recommended to consider placement of a Zio patch if he continued to have worsening palpitations.    Past Medical History:  Diagnosis Date   Arthritis    lower back   Chronic back pain    Disc disease   Chronic kidney disease    Coronary atherosclerosis of native coronary artery    Coronary calcifications by chest CT, Myoview demonstrating inferolateral scar   Deafness in right ear    Diabetes mellitus    Diabetic retinopathy (HCC)    moderate nonproliferative with macular edema in right eye   Diastolic dysfunction 58/6754  Grade 1.   Essential hypertension, benign    Heart murmur    in past   HOH (hard of hearing)    Hyperkalemia    Kidney stones    Mixed hyperlipidemia    NAFLD (nonalcoholic fatty liver disease)    Pancreatic insufficiency    [facility]   Prostate cancer ([facility] 2011   Shortness of breath dyspnea    occasional - liver pressing on right lung, decreased capacity   Subdural hematoma    Type 2 diabetes mellitus ([facility]    Wears hearing aid    "crossover" form right to left    Past Surgical History:  Procedure Laterality Date   APPENDECTOMY     AV FISTULA PLACEMENT Left 05/15/2021   Procedure: INSERTION OF LEFT ARM ARTERIOVENOUS (AV) GORE-TEX GRAFT;  Surgeon: ERosetta Posner MD;  Location: [facility];  Service: Vascular;  Laterality: Left;   Bilateral hip replacement     CATARACT EXTRACTION W/PHACO Left 05/15/2015   Procedure: CATARACT EXTRACTION PHACO AND INTRAOCULAR LENS PLACEMENT ([place];  Surgeon: ARonnell Freshwater MD;  [place] [facility]  Service: Ophthalmology;  Laterality: Left;  DIABETIC - insulin pump and oral meds   CATARACT EXTRACTION W/PHACO Right 08/28/2015   Procedure: CATARACT EXTRACTION PHACO AND INTRAOCULAR LENS PLACEMENT ([facility]);  Surgeon: ARonnell Freshwater MD;  [place] [facility]  Service: Ophthalmology;  Laterality: Right;  DIABETIC PER PT AND CINDY PLEASE KEEP ARRIVAL TIME AFTER 8AM    CHOLECYSTECTOMY     HERNIA REPAIR     INSERTION OF DIALYSIS CATHETER Right 01/22/2021   Procedure: INSERTION OF DIALYSIS CATHETER;  Surgeon: BConstance Haw  Lanell Matar, MD;  Location: [facility];  Service: General;  Laterality: Right;   INSERTION OF DIALYSIS CATHETER Right 04/16/2021   Procedure: INSERTION OF DIALYSIS CATHETER;  Surgeon: Virl Cagey, MD;  Location: [facility];  Service: General;  Laterality: Right;   PROSTATECTOMY  2011    Current Medications: Outpatient Medications Prior to Visit  Medication Sig Dispense Refill   aspirin EC 81 MG tablet Take 81 mg by mouth at bedtime.      calcitRIOL (ROCALTROL) 0.25 MCG capsule Take 0.25 mcg by mouth daily.     diltiazem (CARDIZEM CD) 240 MG 24 hr capsule Take 240 mg by mouth daily.     ferric gluconate 125 mg in sodium chloride 0.9 % 100 mL Inject 125  mg into the vein every Monday, Wednesday, and Friday with hemodialysis.     furosemide (LASIX) 40 MG tablet Take 40 mg by mouth daily.     HYDROcodone-acetaminophen (NORCO) 5-325 MG tablet Take 2 tablets by mouth every 6 (six) hours as needed for moderate pain. 120 tablet 0   insulin aspart (NOVOLOG) 100 UNIT/ML injection Inject 8 Units into the skin 3 (three) times daily with meals. (Patient taking differently: Inject 10 Units into the skin in the morning.) 10 mL 11   insulin glargine (LANTUS) 100 UNIT/ML injection Inject 0.12 mLs (12 Units total) into the skin at bedtime. (Patient taking differently: Inject 12 Units into the skin in the morning.) 10 mL 11   metoprolol tartrate (LOPRESSOR) 50 MG tablet Take 1 tablet (50 mg total) by mouth 2 (two) times daily. 180 tablet 1   Pancrelipase, Lip-Prot-Amyl, (ZENPEP) 10000-32000 units CPEP Take 1 capsule by mouth 3 (three) times daily with meals.     pravastatin (PRAVACHOL) 10 MG tablet TAKE 1 TABLET(10 MG) BY MOUTH DAILY WITH BREAKFAST 90 tablet 1   sevelamer carbonate (RENVELA) 800 MG tablet Take 2 tablets (1,600 mg total) by mouth 3 (three) times daily with meals. 180 tablet 11   sodium bicarbonate 650 MG tablet Take 650 mg by mouth daily.     No facility-administered medications prior to visit.     Allergies:   Enalapril maleate   Social History   Socioeconomic History   Marital status: Married    Spouse name: Not on file   Number of children: Not on file   Years of education: Not on file   Highest education level: Not on file  Occupational History   Not on file  Tobacco Use   Smoking status: Never   Smokeless tobacco: Never  Vaping Use   Vaping Use: Never used  Substance and Sexual Activity   Alcohol use: No   Drug use: No   Sexual activity: Yes  Other Topics Concern   Not on file  Social History Narrative   Not on file   Social Determinants of Health   Financial Resource Strain: Not on file  Food Insecurity: Not on file   Transportation Needs: Not on file  Physical Activity: Not on file  Stress: Not on file  Social Connections: Not on file     Family History:  The patient's ***family history includes Diabetes type II in his mother; Heart attack in his father.   Review of Systems:    Please see the history of present illness.     All other systems reviewed and are otherwise negative except as noted above.   Physical Exam:    VS:  There were no vitals taken for this visit.  General: Well developed, well nourished,male appearing in no acute distress. Head: Normocephalic, atraumatic. Neck: No carotid bruits. JVD not elevated.  Lungs: Respirations regular and unlabored, without wheezes or rales.  Heart: ***Regular rate and rhythm. No S3 or S4.  No murmur, no rubs, or gallops appreciated. Abdomen: Appears non-distended. No obvious abdominal masses. Msk:  Strength and tone appear normal for age. No obvious joint deformities or effusions. Extremities: No clubbing or cyanosis. No edema.  Distal pedal pulses are 2+ bilaterally. Neuro: Alert and oriented X 3. Moves all extremities spontaneously. No focal deficits noted. Psych:  Responds to questions appropriately with a normal affect. Skin: No rashes or lesions noted  Wt Readings from Last 3 Encounters:  05/09/21 170 lb (77.1 kg)  04/18/21 190 lb 0.6 oz (86.2 kg)  04/12/21 178 lb (80.7 kg)        Studies/Labs Reviewed:   EKG:  EKG is*** ordered today.  The ekg ordered today demonstrates ***  Recent Labs: 01/04/2021: TSH 1.477 02/06/2021: Magnesium 1.7 04/13/2021: B Natriuretic Peptide 700.0 04/18/2021: ALT 9; Platelets 209 05/15/2021: BUN 39; Creatinine, Ser 3.00; Hemoglobin 10.9; Potassium 4.2; Sodium 136   Lipid Panel    Component Value Date/Time   CHOL 103 07/20/2019 0938   TRIG 81 07/20/2019 0938   HDL 38 (L) 07/20/2019 0938   CHOLHDL 2.7 07/20/2019 0938   VLDL 25.2 02/22/2016 1522   LDLCALC 49 07/20/2019 0938   LDLDIRECT 54 05/27/2016  1421    Additional studies/ records that were reviewed today include:   Echocardiogram: 12/2020 IMPRESSIONS     1. Left ventricular ejection fraction, by estimation, is 60 to 65%. The  left ventricle has normal function. The left ventricle has no regional  wall motion abnormalities. There is mild left ventricular hypertrophy.  Left ventricular diastolic parameters  are consistent with Grade I diastolic dysfunction (impaired relaxation).   2. Right ventricular systolic function is normal. The right ventricular  size is normal. There is normal pulmonary artery systolic pressure.   3. The mitral valve is normal in structure. No evidence of mitral valve  regurgitation. No evidence of mitral stenosis.   4. The aortic valve is tricuspid. Aortic valve regurgitation is not  visualized. Mild aortic valve sclerosis is present, with no evidence of  aortic valve stenosis.   5. Aortic dilatation noted. There is mild dilatation of the aortic root,  measuring 39 mm.   6. The inferior vena cava is normal in size with greater than 50%  respiratory variability, suggesting right atrial pressure of 3 mmHg.   Assessment:    No diagnosis found.   Plan:   In order of problems listed above:  ***    Shared Decision Making/Informed Consent:   {Are you ordering a CV Procedure (e.g. stress test, cath, DCCV, TEE, etc)?   Press F2        :[id number]}    Medication Adjustments/Labs and Tests Ordered: Current medicines are reviewed at length with the patient today.  Concerns regarding medicines are outlined above.  Medication changes, Labs and Tests ordered today are listed in the Patient Instructions below. There are no Patient Instructions on file for this visit.   Signed, Erma Heritage, PA-C  06/11/2021 11:07 AM    [facility] [address] [place], [place] [address] Phone: [phone] Fax: [phone]

## 2021-06-12 ENCOUNTER — Ambulatory Visit: Payer: PPO | Admitting: Student

## 2021-06-14 ENCOUNTER — Encounter: Payer: PPO | Admitting: Physician Assistant

## 2021-06-18 DIAGNOSIS — Z992 Dependence on renal dialysis: Secondary | ICD-10-CM | POA: Diagnosis not present

## 2021-06-18 DIAGNOSIS — E1122 Type 2 diabetes mellitus with diabetic chronic kidney disease: Secondary | ICD-10-CM | POA: Diagnosis not present

## 2021-06-18 DIAGNOSIS — N186 End stage renal disease: Secondary | ICD-10-CM | POA: Diagnosis not present

## 2021-06-18 DIAGNOSIS — D689 Coagulation defect, unspecified: Secondary | ICD-10-CM | POA: Diagnosis not present

## 2021-06-18 DIAGNOSIS — D631 Anemia in chronic kidney disease: Secondary | ICD-10-CM | POA: Diagnosis not present

## 2021-06-18 DIAGNOSIS — N2581 Secondary hyperparathyroidism of renal origin: Secondary | ICD-10-CM | POA: Diagnosis not present

## 2021-06-19 DIAGNOSIS — N186 End stage renal disease: Secondary | ICD-10-CM | POA: Diagnosis not present

## 2021-06-19 DIAGNOSIS — Z992 Dependence on renal dialysis: Secondary | ICD-10-CM | POA: Diagnosis not present

## 2021-06-19 DIAGNOSIS — Z452 Encounter for adjustment and management of vascular access device: Secondary | ICD-10-CM | POA: Diagnosis not present

## 2021-06-20 ENCOUNTER — Encounter: Payer: PPO | Admitting: Vascular Surgery

## 2021-06-21 ENCOUNTER — Other Ambulatory Visit: Payer: Self-pay

## 2021-06-21 ENCOUNTER — Encounter: Payer: PPO | Admitting: Physician Assistant

## 2021-06-21 DIAGNOSIS — L97222 Non-pressure chronic ulcer of left calf with fat layer exposed: Secondary | ICD-10-CM | POA: Diagnosis not present

## 2021-06-21 NOTE — Progress Notes (Addendum)
[name], [name] ([id number]) Visit Report for 06/21/2021 Chief Complaint Document Details Patient Name: [name], [name]. Date of Service: 06/21/2021 2:15 PM Medical Record Number: [id number] Patient Account Number: [id number] Date of Birth/Sex: 16-Jan-1941 (80 y.o. 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 ([id number]) -------------------------------------------------------------------------------- Debridement Details Patient Name: [name] Morale L. Date of Service: 06/21/2021 2:15 PM Medical Record Number: [id number] Patient Account Number: [id number] Date of Birth/Sex: 1941/05/04 (80 y.o. M) Treating RN: Carlene Coria Primary Care Provider: Jenna Luo Other Clinician: Referring Provider: Jenna Luo Treating Provider/Extender: Skipper Cliche in Treatment: 4 Debridement Performed for Wound #1 Left,Posterior Lower Leg Assessment: Performed By: Physician Tommie Sams., PA-C Debridement Type: Debridement Level of Consciousness (Pre- Awake and Alert procedure): Pre-procedure Verification/Time Out Yes - 15:15 Taken: Start Time: 15:15 Pain Control: Lidocaine 4% Topical Solution Total Area Debrided (L x W): 5 (cm) x 5 (cm) = 25 (cm) Tissue and other material Viable, Non-Viable, Slough, Subcutaneous, Skin: Dermis , Skin: Epidermis, Slough debrided: Level: Skin/Subcutaneous Tissue Debridement Description: Excisional Instrument: Forceps, Scissors Bleeding: Moderate Hemostasis Achieved: Pressure End Time: 15:29 Procedural Pain: 0 Post Procedural Pain: 0 Response to  Treatment: Procedure was tolerated well Level of Consciousness (Post- Awake and Alert procedure): Post Debridement Measurements of Total Wound Length: (cm) 20 Width: (cm) 13 Depth: (cm) 0.4 Volume: (cm) 81.681 Character of Wound/Ulcer Post Debridement: Improved Post Procedure Diagnosis Same as Pre-procedure Electronic Signature(s) Signed: 06/21/2021 5:24:14 PM By: Worthy Keeler PA-C Signed: 06/22/2021 10:13:49 AM By: Carlene Coria RN Entered By: Worthy Keeler on 06/21/2021 17:24:14 [name], [name] ([id number]) -------------------------------------------------------------------------------- HPI Details Patient Name: [name], [name] Date of Service: 06/21/2021 2:15 PM Medical Record Number: [id number] Patient Account Number: [id number] Date of Birth/Sex: 1940-11-14 (80 y.o. 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 This has been present for a couple of months currently he is on the sodium thiosulfate at dialysis. He tells me that they picked up on this quickly and have been managing it but he really has not had any help with anything else from the standpoint of progressing the wound itself. The patient does have again end-stage renal disease which currently is listed as stage IV but he is on renal dialysis. He also has coronary artery disease as well as going shortly for formal arterial studies although he did have a quick test of the tibial artery with his vascular doctor when he went in to check on his fistula for his arm and subsequently they did not feel like he had any hemodynamically unstable flow into  the lower extremity this is good news. Nonetheless I think that the biggest issue here is going to be getting some of the eschar off so that we get the wounds med headed in the appropriate direction. 05/31/2021 upon evaluation today patient appears to be doing well with regard to his leg ulceration with calciphylaxis. I am very pleased in this regard.  Fortunately there does not appear to be any signs of active infection at this time which is great news. No fevers, chills, nausea, vomiting, or diarrhea. 06/07/2021 upon evaluation today patient appears to be doing well with regard to his legs. Unfortunately he is having an issue here with his penis as well he did mention at the end of last visit. I suggested that he go see urology. However when he went to see the urologist they basically told him there was not anything they could do. They recommended that we needed to be the ones to take care of this. With that being said this is an unusual area that to be honest a lot of the traditional wound care products we use are not really good to be good for this region. Potentially Medihoney alginate could be a possibility also think Santyl could be a possibility. With that being said I think initially my suggestion is probably can be for [place] to attempt the Santyl to see if that will benefit the patient. 06/21/2021 upon evaluation today patient's wound bed actually showed signs of good granulation and epithelization at this point. Fortunately I do not see any signs of active infection systemically which is great news and his legs are doing awesome I think you are making great progress. The Santyl on the glans penis also has been of benefit this is looking tremendously better. Again the patient is pleased he also tells me the pain is significantly improved. Overall I think we are headed in the appropriate direction here. Electronic Signature(s) Signed: 06/21/2021 5:21:55 PM By: Worthy Keeler PA-C Entered By:  Worthy Keeler on 06/21/2021 17:21:54 [name], [name] ([id number]) -------------------------------------------------------------------------------- Physical Exam Details Patient Name: [name], [name] Date of Service: 06/21/2021 2:15 PM Medical Record Number: [id number] Patient Account Number: [id number] Date of Birth/Sex: October 24, 1940 (80 y.o. M) Treating RN: Carlene Coria Primary Care Provider: Jenna Luo Other Clinician: Referring Provider: Jenna Luo Treating Provider/Extender: Skipper Cliche in Treatment: 4 Constitutional Well-nourished and well-hydrated in no acute distress. Respiratory normal breathing without difficulty. Psychiatric this patient is able to make decisions and demonstrates good insight into disease process. Alert and Oriented x 3. pleasant and cooperative. Notes Upon inspection patient's wounds again showed signs of improvement I did perform some debridement in regard to the left lateral leg and posterior leg where I was able to remove some of the eschar this was not a full surface debridement but basically debridement of the eschar and areas as much as I could. He tolerated that without any significant pain and minimal bleeding noted postdebridement this looks better. Again we will get a continue with the Santyl for the glans penis. Electronic Signature(s) Signed: 06/21/2021 5:22:24 PM By: Worthy Keeler PA-C Entered By: Worthy Keeler on 06/21/2021 17:22:24 [name], [name] ([id number]) -------------------------------------------------------------------------------- Physician Orders Details Patient Name: [name] Morale L. Date of Service: 06/21/2021 2:15 PM Medical Record Number: [id number] Patient Account Number: [id number] Date of Birth/Sex: [birth year] (80 y.o. 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 Edema Control - Lymphedema / Segmental Compressive Device / Other o Elevate, Exercise Daily and Avoid Standing for Long Periods of Time. o Elevate legs to the level of the heart and pump ankles as often as possible o Elevate leg(s) parallel to the floor when sitting. Wound Treatment Wound #1 - Lower Leg Wound Laterality: Left, Posterior Primary Dressing: Kerlix AMD Roll Dressing, 4.5x 4.1 (in/in) (Generic) 1 x Per Day/30 Days Discharge Instructions: moisten with normal saline Secondary Dressing: Kerlix 4.5 x 4.1 (in/yd) (Generic) 1 x Per Day/30 Days Discharge Instructions: Apply Kerlix 4.5 x 4.1 (in/yd) as instructed Secured With: 24M Medipore H Soft Cloth Surgical Tape, 2x2 (in/yd) (Generic) 1 x Per Day/30 Days Wound #2 - Penis Primary Dressing: Gauze 1 x Per Day/30 Days Discharge Instructions: moistened with santyl Primary Dressing: Santyl Collagenase Ointment, 30 (gm), tube 1 x Per Day/30 Days Electronic Signature(s) Signed: 06/21/2021 5:41:09 PM By: Worthy Keeler PA-C Signed: 06/22/2021 10:13:49 AM By: Carlene Coria RN Entered By: Carlene Coria on 06/21/2021 15:24:13 [name], [name] ([id number]) -------------------------------------------------------------------------------- Problem List Details Patient Name: [name], Deunte L. Date of Service: 06/21/2021 2:15 PM Medical Record Number: [id number] Patient Account Number: [id number] Date of  Birth/Sex: 1941-02-07 (80 y.o. M) Treating RN: Carlene Coria Primary Care Provider: Jenna Luo Other Clinician: Referring Provider: Jenna Luo Treating Provider/Extender: Skipper Cliche in Treatment: 4 Active Problems ICD-10 Encounter Code Description Active Date MDM Diagnosis E83.50 Unspecified disorder of calcium metabolism 05/24/2021 No Yes L97.822 Non-pressure chronic ulcer of other part of left lower leg with fat layer 05/24/2021 No Yes exposed L98.492 Non-pressure chronic ulcer of skin of other sites with fat layer exposed 06/07/2021 No Yes Z99.2 Dependence on renal dialysis 05/24/2021 No Yes N18.4 Chronic kidney disease, stage 4 (severe) 05/24/2021 No Yes I25.10 Atherosclerotic heart disease of native coronary artery without angina 05/24/2021 No Yes pectoris Inactive Problems Resolved Problems Electronic Signature(s) Signed: 06/21/2021 2:34:45 PM By: Worthy Keeler PA-C Entered By: Worthy Keeler on 06/21/2021 14:34:45 [name], [name] ([id number]) -------------------------------------------------------------------------------- Progress Note Details Patient Name: [name], [name] Date of Service: 06/21/2021 2:15 PM Medical Record Number: [id number] Patient Account Number: [id number] Date of Birth/Sex: 06/20/1941 (80 y.o. 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 This has been present for a couple of months currently he is on the sodium thiosulfate at dialysis. He tells me that they  picked up on this quickly and have been managing it but he really has not had any help with anything else from the standpoint of progressing the wound itself. The patient does have again end-stage renal disease which currently is listed as stage IV but he is on renal dialysis. He also has coronary artery disease as well as going shortly for formal arterial studies although he did have a quick test of the tibial artery with his vascular doctor when he went in to check on his fistula for his arm and subsequently they did not feel like he had any hemodynamically unstable flow into the lower extremity this is good news. Nonetheless I think that the biggest  issue here is going to be getting some of the eschar off so that we get the wounds med headed in the appropriate direction. 05/31/2021 upon evaluation today patient appears to be doing well with regard to his leg ulceration with calciphylaxis. I am very pleased in this regard. Fortunately there does not appear to be any signs of active infection at this time which is great news. No fevers, chills, nausea, vomiting, or diarrhea. 06/07/2021 upon evaluation today patient appears to be doing well with regard to his legs. Unfortunately he is having an issue here with his penis as well he did mention at the end of last visit. I suggested that he go see urology. However when he went to see the urologist they basically told him there was not anything they could do. They recommended that we needed to be the ones to take care of this. With that being said this is an unusual area that to be honest a lot of the traditional wound care products we use are not really good to be good for this region. Potentially Medihoney alginate could be a possibility also think Santyl could be a possibility. With that being said I think initially my suggestion is probably can be for [place] to attempt the Santyl to see if that will benefit the patient. 06/21/2021 upon evaluation today  patient's wound bed actually showed signs of good granulation and epithelization at this point. Fortunately I do not see any signs of active infection systemically which is great news and his legs are doing awesome I think you are making great progress. The Santyl on the glans penis also has been of benefit this is looking tremendously better. Again the patient is pleased he also tells me the pain is significantly improved. Overall I think we are headed in the appropriate direction here. Objective Constitutional Well-nourished and well-hydrated in no acute distress. Vitals Time Taken: 2:45 PM, Height: 68 in, Weight: 172 lbs, BMI: 26.1, Temperature: 98.2 F, Pulse: 74 bpm, Respiratory Rate: 18 breaths/min, Blood Pressure: 145/79 mmHg. Respiratory normal breathing without difficulty. Psychiatric this patient is able to make decisions and demonstrates good insight into disease process. Alert and Oriented x 3. pleasant and cooperative. General Notes: Upon inspection patient's wounds again showed signs of improvement I did perform some debridement in regard to the left lateral leg and posterior leg where I was able to remove some of the eschar this was not a full surface debridement but basically debridement of the eschar and areas as much as I could. He tolerated that without any significant pain and minimal bleeding noted postdebridement this looks better. Again we will get a continue with the Santyl for the glans penis. Integumentary (Hair, Skin) Wound #1 status is Open. Original cause of wound was Gradually Appeared. The date acquired was: 04/05/2021. The wound has been in treatment 4 weeks. The wound is located on the Left,Posterior Lower Leg. The wound measures 20cm length x 13cm width x 0.4cm depth; 204.204cm^2 area and 81.681cm^3 volume. There is Fat Layer (Subcutaneous Tissue) exposed. There is no tunneling or undermining noted. There is a medium amount of serosanguineous drainage noted.  There is small (1-33%) red, pink granulation within the wound bed. There is a large (67-100%) Pund, [place] ([id number]) amount of necrotic tissue within the wound bed including Eschar and Adherent Slough. Wound #2 status is Open. Original cause of wound was Gradually Appeared. The date acquired was: 05/05/2021. The wound has been in treatment 2 weeks. The wound is located  on the Penis. The wound measures 2cm length x 2cm width x 0.1cm depth; 3.142cm^2 area and 0.314cm^3 volume. There is Fat Layer (Subcutaneous Tissue) exposed. There is no tunneling or undermining noted. There is a medium amount of serosanguineous drainage noted. There is a large (67-100%) amount of necrotic tissue within the wound bed including Eschar and Adherent Slough. 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 There was a Excisional Skin/Subcutaneous Tissue Debridement with a total area of 25 sq cm performed by Tommie Sams., PA-C. With the following instrument(s): Forceps, and Scissors to remove Viable and Non-Viable tissue/material. Material removed includes Subcutaneous Tissue, Slough, Skin: Dermis, and Skin: Epidermis after achieving pain control using Lidocaine 4% Topical Solution. No specimens were taken. A time out was conducted at 15:15, prior to the start of the procedure. A Moderate amount of bleeding was controlled with Pressure. The procedure was tolerated well with a pain level of 0 throughout and a pain level of 0 following the procedure. Post Debridement  Measurements: 20cm length x 13cm width x 0.4cm depth; 81.681cm^3 volume. Character of Wound/Ulcer Post Debridement is improved. Post procedure Diagnosis Wound #1: Same as Pre-Procedure Plan Follow-up Appointments: Return Appointment in 1 week. Edema Control - Lymphedema / Segmental Compressive Device / Other: Elevate, Exercise Daily and Avoid Standing for Long Periods of Time. Elevate legs to the level of the heart and pump ankles as often as possible Elevate leg(s) parallel to the floor when sitting. WOUND #1: - Lower Leg Wound Laterality: Left, Posterior Primary Dressing: Kerlix AMD Roll Dressing, 4.5x 4.1 (in/in) (Generic) 1 x Per Day/30 Days Discharge Instructions: moisten with normal saline Secondary Dressing: Kerlix 4.5 x 4.1 (in/yd) (Generic) 1 x Per Day/30 Days Discharge Instructions: Apply Kerlix 4.5 x 4.1 (in/yd) as instructed Secured With: 2M Medipore H Soft Cloth Surgical Tape, 2x2 (in/yd) (Generic) 1 x Per Day/30 Days WOUND #2: - Penis Wound Laterality: Primary Dressing: Gauze 1 x Per Day/30 Days Discharge Instructions: moistened with santyl Primary Dressing: Santyl Collagenase Ointment, 30 (gm), tube 1 x Per Day/30 Days 1. I would recommend that we continue with Santyl for the penile ulcer location that is doing a great job. 2. Also can recommend that we continue with the [place] moistened gauze to the leg to try to help clear up the eschar that is doing a great job. 3. We will also continue with the ABD pads followed by roll gauze to secure in place. We will see patient back for reevaluation in 1 week here in the clinic. If anything worsens or changes patient will contact our office for additional recommendations. EFFIE, JANOSKI ([id number]) Electronic Signature(s) Signed: 06/21/2021 5:24:27 PM By: Worthy Keeler PA-C Previous Signature: 06/21/2021 5:22:56 PM Version By: Worthy Keeler PA-C Entered By: Worthy Keeler on 06/21/2021 17:24:27 Nestle, Dreux L.  ([id number]) -------------------------------------------------------------------------------- SuperBill Details Patient Name: [name] Morale L. Date of Service: 06/21/2021 Medical Record Number: [id number] Patient Account Number: [id number] Date of Birth/Sex: 08-15-1940 (80 y.o. 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 Z99.2 Dependence on renal dialysis N18.4 Chronic kidney disease, stage 4 (severe) I25.10 Atherosclerotic heart disease of native coronary artery without angina pectoris Facility Procedures CPT4 Code: 68257493 Description: 55217 - DEB SUBQ TISSUE 20 SQ CM/< Modifier: Quantity: 1 CPT4 Code: Description: ICD-10 Diagnosis Description L97.822 Non-pressure chronic ulcer of other part of left lower leg with fat layer Modifier: exposed Quantity: CPT4 Code: 47159539 Description: 67289 - DEB SUBQ TISS EA ADDL 20CM Modifier: Quantity: 1 CPT4 Code: Description: ICD-10 Diagnosis Description L97.822 Non-pressure chronic ulcer of other part of left lower leg with fat layer Modifier: exposed Quantity: Physician Procedures CPT4 Code: 7915041 Description: 11042 - WC PHYS SUBQ TISS 20 SQ CM Modifier: Quantity: 1 CPT4 Code: Description: ICD-10 Diagnosis Description L97.822 Non-pressure chronic ulcer of other part of left lower leg with fat layer Modifier: exposed Quantity: CPT4 Code: 3643837 Description: 11045 - WC PHYS SUBQ TISS EA ADDL 20 CM Modifier: Quantity: 1 CPT4 Code: Description: ICD-10 Diagnosis Description L97.822 Non-pressure chronic ulcer of other part of  left lower leg with fat layer Modifier: exposed Quantity: Electronic Signature(s) Signed: 06/21/2021 5:24:50 PM By: Worthy Keeler PA-C Previous Signature: 06/21/2021 4:37:22 PM Version By: Carlene Coria RN Entered By: Worthy Keeler on 06/21/2021 17:24:50

## 2021-06-21 NOTE — Progress Notes (Addendum)
[name] ([id number]) Visit Report for 06/21/2021 Arrival Information Details Patient Name: [name]. Date of Service: 06/21/2021 2:15 PM Medical Record Number: [id number] Patient Account Number: [id number] Date of Birth/Sex: 08/04/1941 (80 y.o. M) Treating RN: Carlene Coria Primary Care Kelan Pritt: Jenna Luo Other Clinician: Referring Rianne Degraaf: Jenna Luo Treating Jamiyah Dingley/Extender: Skipper Cliche in Treatment: 4 Visit Information History Since Last Visit All ordered tests and consults were completed: No Patient Arrived: Ambulatory Added or deleted any medications: No Arrival Time: 14:38 Any new allergies or adverse reactions: No Accompanied By: wife Had a fall or experienced change in No Transfer Assistance: None activities of daily living that may affect Patient Identification Verified: Yes risk of falls: Secondary Verification Process Completed: Yes Signs or symptoms of abuse/neglect since last visito No Patient Requires Transmission-Based Precautions: No Hospitalized since last visit: No Patient Has Alerts: No Implantable device outside of the clinic excluding No cellular tissue based products placed in the center since last visit: Has Dressing in Place as Prescribed: Yes Has Compression in Place as Prescribed: Yes Pain Present Now: No Electronic Signature(s) Signed: 06/22/2021 10:13:49 AM By: Carlene Coria RN Entered By: Carlene Coria on 06/21/2021 14:45:50 [name], [name] ([id number]) -------------------------------------------------------------------------------- Clinic Level of Care Assessment Details Patient Name: [name] Morale L. Date of Service: 06/21/2021 2:15 PM Medical Record Number: [id number] Patient Account Number: [id number] Date of Birth/Sex: September 25, 1940 (80 y.o. M) Treating RN: Carlene Coria Primary Care Meshawn Oconnor: Jenna Luo Other Clinician: Referring Laylani Pudwill: Jenna Luo Treating Trayonna Bachmeier/Extender: Skipper Cliche in Treatment: 4 Clinic Level of Care Assessment Items TOOL 1 Quantity Score []  - Use when EandM and Procedure is performed on INITIAL visit 0 ASSESSMENTS - Nursing Assessment / Reassessment []  - General Physical Exam (combine w/ comprehensive assessment (listed just below) when performed on new 0 pt. evals) []  - 0 Comprehensive Assessment (HX, ROS, Risk Assessments, Wounds Hx, etc.) ASSESSMENTS - Wound and Skin Assessment / Reassessment []  - Dermatologic / Skin Assessment (not related to wound area) 0 ASSESSMENTS - Ostomy and/or Continence Assessment and Care []  - Incontinence Assessment and Management 0 []  - 0 Ostomy Care Assessment and Management (repouching, etc.) PROCESS - Coordination of Care []  - Simple Patient / Family Education for ongoing care 0 []  - 0 Complex (extensive) Patient / Family Education for ongoing care []  - 0 Staff obtains Programmer, systems, Records, Test Results / Process Orders []  - 0 Staff telephones HHA, Nursing Homes / Clarify orders / etc []  - 0 Routine Transfer to another Facility (non-emergent condition) []  - 0 Routine Hospital Admission (non-emergent condition) []  - 0 New Admissions / Biomedical engineer / Ordering NPWT, Apligraf, etc. []  - 0 Emergency Hospital Admission (emergent condition) PROCESS - Special Needs []  - Pediatric / Minor Patient Management 0 []  - 0 Isolation Patient Management []  - 0 Hearing / Language / Visual special needs []  - 0 Assessment of Community assistance (transportation, D/C planning, etc.) []  - 0 Additional assistance / Altered mentation []  - 0 Support Surface(s) Assessment (bed, cushion, seat, etc.) INTERVENTIONS - Miscellaneous []  - External ear exam 0 []  - 0 Patient Transfer (multiple staff / Civil Service fast streamer / Similar devices) []  - 0 Simple Staple / Suture removal (25 or less) []  - 0 Complex Staple / Suture removal (26 or more) []  - 0 Hypo/Hyperglycemic Management (do not check if billed  separately) []  - 0 Ankle / Brachial Index (ABI) - do not check if billed separately Has the patient been seen at the hospital within the last  three years: Yes Total Score: 0 Level Of Care: ____ [name] ([id number]) Electronic Signature(s) Signed: 06/22/2021 10:13:49 AM By: Carlene Coria RN Entered By: Carlene Coria on 06/21/2021 15:24:37 [name], [name] ([id number]) -------------------------------------------------------------------------------- Encounter Discharge Information Details Patient Name: [name] Derry, [name] Date of Service: 06/21/2021 2:15 PM Medical Record Number: [id number] Patient Account Number: [id number] Date of Birth/Sex: 06/03/1941 (80 y.o. M) Treating RN: Carlene Coria Primary Care Baxter Gonzalez: Jenna Luo Other Clinician: Referring Shakevia Sarris: Jenna Luo Treating Anaiah Mcmannis/Extender: Skipper Cliche in Treatment: 4 Encounter Discharge Information Items Post Procedure Vitals Discharge Condition: Stable Temperature (F): 98.2 Ambulatory Status: Wheelchair Pulse (bpm): 74 Discharge Destination: Home Respiratory Rate (breaths/min): 18 Transportation: Private Auto Blood Pressure (mmHg): 145/79 Accompanied By: wife Schedule Follow-up Appointment: Yes Clinical Summary of Care: Patient Declined Electronic Signature(s) Signed: 06/22/2021 10:13:49 AM By: Carlene Coria RN Entered By: Carlene Coria on 06/21/2021 15:35:43 [name], [name] ([id number]) -------------------------------------------------------------------------------- Lower Extremity Assessment Details Patient Name: [name], [name] Date of Service: 06/21/2021 2:15 PM Medical Record Number: [id number] Patient Account Number: [id number] Date of Birth/Sex: [birth year] (80 y.o. M) Treating RN: Carlene Coria Primary Care Amaris Delafuente: Jenna Luo Other Clinician: Referring Esaw Knippel: Jenna Luo Treating Kelsee Preslar/Extender: Skipper Cliche in Treatment: 4 Edema Assessment Assessed: [Left:  No] [Right: No] Edema: [Left: Ye] [Right: s] Calf Left: Right: Point of Measurement: 34 cm From Medial Instep 35 cm Ankle Left: Right: Point of Measurement: 9 cm From Medial Instep 20 cm Vascular Assessment Pulses: Dorsalis Pedis Palpable: [Left:Yes] Electronic Signature(s) Signed: 06/22/2021 10:13:49 AM By: Carlene Coria RN Entered By: Carlene Coria on 06/21/2021 15:03:47 [name], [name] ([id number]) -------------------------------------------------------------------------------- Multi Wound Chart Details Patient Name: [name] Morale L. Date of Service: 06/21/2021 2:15 PM Medical Record Number: [id number] Patient Account Number: [id number] Date of Birth/Sex: 11/21/1940 (80 y.o. M) Treating RN: Carlene Coria Primary Care Wise Fees: Jenna Luo Other Clinician: Referring Dennice Tindol: Jenna Luo Treating Almendra Loria/Extender: Skipper Cliche in Treatment: 4 Vital Signs Height(in): 68 Pulse(bpm): 74 Weight(lbs): 172 Blood Pressure(mmHg): 145/79 Body Mass Index(BMI): 26 Temperature(F): 98.2 Respiratory Rate(breaths/min): 18 Photos: [N/A:N/A] Wound Location: Left, Posterior Lower Leg Penis N/A Wounding Event: Gradually Appeared Gradually Appeared N/A Primary Etiology: Calciphylaxis Calciphylaxis N/A Comorbid History: Coronary Artery Disease, Type II Coronary Artery Disease, Type II N/A Diabetes Diabetes Date Acquired: 04/05/2021 05/05/2021 N/A Weeks of Treatment: 4 2 N/A Wound Status: Open Open N/A Clustered Wound: Yes No N/A Measurements L x W x D (cm) 20x13x0.4 2x2x0.1 N/A Area (cm) : 204.204 3.142 N/A Volume (cm) : 81.681 0.314 N/A % Reduction in Area: -18.20% 33.30% N/A % Reduction in Volume: -18.20% 33.30% N/A Classification: Full Thickness Without Exposed Full Thickness Without Exposed N/A Support Structures Support Structures Exudate Amount: Medium Medium N/A Exudate Type: Serosanguineous Serosanguineous N/A Exudate Color: red, brown red, brown  N/A Granulation Amount: Small (1-33%) N/A N/A Granulation Quality: Red, Pink N/A N/A Necrotic Amount: Large (67-100%) N/A N/A Necrotic Tissue: Eschar, Adherent [place] N/A Exposed Structures: Fat Layer (Subcutaneous Tissue): Fat Layer (Subcutaneous Tissue): N/A Yes Yes Fascia: No Fascia: No Tendon: No Tendon: No Muscle: No Muscle: No Joint: No Joint: No Bone: No Bone: No Epithelialization: None None N/A Treatment Notes Electronic Signature(s) Signed: 06/22/2021 10:13:49 AM By: Carlene Coria RN Entered By: Carlene Coria on 06/21/2021 15:17:13 [name], [name] ([id number]) -------------------------------------------------------------------------------- Wise Details Patient Name: [name] Morale L. Date of Service: 06/21/2021 2:15 PM Medical Record Number: [id number] Patient Account Number: [id number] Date of Birth/Sex: [birth year] (80 y.o. M) Treating RN: Carlene Coria Primary  Care Tomicka Lover: Jenna Luo Other Clinician: Referring Christabel Camire: Jenna Luo Treating Xzandria Clevinger/Extender: Skipper Cliche in Treatment: 4 Active Inactive Abuse / Safety / Falls / Self Care Management Nursing Diagnoses: Potential for injury related to falls Goals: Patient will remain injury free related to falls Date Initiated: 05/24/2021 Target Resolution Date: 06/24/2021 Goal Status: Active Interventions: Assess Activities of Daily Living upon admission and as needed Assess fall risk on admission and as needed Assess: immobility, friction, shearing, incontinence upon admission and as needed Assess impairment of mobility on admission and as needed per policy Assess personal safety and home safety (as indicated) on admission and as needed Assess self care needs on admission and as needed Notes: Nutrition Nursing Diagnoses: Impaired glucose control: actual or potential Goals: Patient/caregiver agrees to and verbalizes understanding of need to  use nutritional supplements and/or vitamins as prescribed Date Initiated: 05/24/2021 Target Resolution Date: 06/25/2021 Goal Status: Active Interventions: Assess HgA1c results as ordered upon admission and as needed Assess patient nutrition upon admission and as needed per policy Notes: Wound/Skin Impairment Nursing Diagnoses: Knowledge deficit related to ulceration/compromised skin integrity Goals: Patient/caregiver will verbalize understanding of skin care regimen Date Initiated: 05/24/2021 Target Resolution Date: 06/25/2021 Goal Status: Active Ulcer/skin breakdown will have a volume reduction of 30% by week 4 Date Initiated: 05/24/2021 Target Resolution Date: 07/24/2021 Goal Status: Active Ulcer/skin breakdown will have a volume reduction of 50% by week 8 Date Initiated: 05/24/2021 Target Resolution Date: 08/24/2021 Goal Status: Active Ulcer/skin breakdown will have a volume reduction of 80% by week 12 Date Initiated: 05/24/2021 Target Resolution Date: 09/24/2021 TRAVER, MECKES ([id number]) Goal Status: Active Ulcer/skin breakdown will heal within 14 weeks Date Initiated: 05/24/2021 Target Resolution Date: 10/22/2021 Goal Status: Active Interventions: Assess patient/caregiver ability to obtain necessary supplies Assess patient/caregiver ability to perform ulcer/skin care regimen upon admission and as needed Assess ulceration(s) every visit Notes: Electronic Signature(s) Signed: 06/22/2021 10:13:49 AM By: Carlene Coria RN Entered By: Carlene Coria on 06/21/2021 15:15:05 [name], [name] ([id number]) -------------------------------------------------------------------------------- Pain Assessment Details Patient Name: [name] Morale L. Date of Service: 06/21/2021 2:15 PM Medical Record Number: [id number] Patient Account Number: [id number] Date of Birth/Sex: 1941-03-20 (80 y.o. M) Treating RN: Carlene Coria Primary Care Kynnedi Zweig: Jenna Luo Other  Clinician: Referring Spirit Wernli: Jenna Luo Treating Theola Cuellar/Extender: Skipper Cliche in Treatment: 4 Active Problems Location of Pain Severity and Description of Pain Patient Has Paino No Site Locations Pain Management and Medication Current Pain Management: Electronic Signature(s) Signed: 06/22/2021 10:13:49 AM By: Carlene Coria RN Entered By: Carlene Coria on 06/21/2021 14:46:23 [name], [name] ([id number]) -------------------------------------------------------------------------------- Patient/Caregiver Education Details Patient Name: [name] Morale L. Date of Service: 06/21/2021 2:15 PM Medical Record Number: [id number] Patient Account Number: [id number] Date of Birth/Gender: Jul 17, 1941 (80 y.o. M) Treating RN: Carlene Coria Primary Care Physician: Jenna Luo Other Clinician: Referring Physician: Jenna Luo Treating Physician/Extender: Skipper Cliche in Treatment: 4 Education Assessment Education Provided To: Patient Education Topics Provided Wound/Skin Impairment: Methods: Explain/Verbal Responses: State content correctly Electronic Signature(s) Signed: 06/22/2021 10:13:49 AM By: Carlene Coria RN Entered By: Carlene Coria on 06/21/2021 15:24:52 [name], [name] ([id number]) -------------------------------------------------------------------------------- Wound Assessment Details Patient Name: [name], [name] Date of Service: 06/21/2021 2:15 PM Medical Record Number: [id number] Patient Account Number: [id number] Date of Birth/Sex: 1940/08/17 (80 y.o. M) Treating RN: Carlene Coria Primary Care Darril Patriarca: Jenna Luo Other Clinician: Referring Gorden Stthomas: Jenna Luo Treating Venessa Wickham/Extender: Skipper Cliche in Treatment: 4 Wound Status Wound Number: 1 Primary Etiology: Calciphylaxis Wound Location: Left, Posterior Lower Leg Wound Status:  Open Wounding Event: Gradually Appeared Comorbid History: Coronary Artery Disease, Type II  Diabetes Date Acquired: 04/05/2021 Weeks Of Treatment: 4 Clustered Wound: Yes Photos Wound Measurements Length: (cm) 20 Width: (cm) 13 Depth: (cm) 0.4 Area: (cm) 204.204 Volume: (cm) 81.681 % Reduction in Area: -18.2% % Reduction in Volume: -18.2% Epithelialization: None Tunneling: No Undermining: No Wound Description Classification: Full Thickness Without Exposed Support Structu Exudate Amount: Medium Exudate Type: Serosanguineous Exudate Color: red, brown res Foul Odor After Cleansing: No Slough/Fibrino Yes Wound Bed Granulation Amount: Small (1-33%) Exposed Structure Granulation Quality: Red, Pink Fascia Exposed: No Necrotic Amount: Large (67-100%) Fat Layer (Subcutaneous Tissue) Exposed: Yes Necrotic Quality: Eschar, Adherent Slough Tendon Exposed: No Muscle Exposed: No Joint Exposed: No Bone Exposed: No Treatment Notes Wound #1 (Lower Leg) Wound Laterality: Left, Posterior Cleanser Peri-Wound Care Topical Primary Dressing [name], [name] ([id number]) Kerlix AMD Roll Dressing, 4.5x 4.1 (in/in) Discharge Instruction: moisten with normal saline Secondary Dressing Kerlix 4.5 x 4.1 (in/yd) Discharge Instruction: Apply Kerlix 4.5 x 4.1 (in/yd) as instructed Secured With 57M Medipore H Soft Cloth Surgical Tape, 2x2 (in/yd) Compression Wrap Compression Stockings Add-Ons Electronic Signature(s) Signed: 06/22/2021 10:13:49 AM By: Carlene Coria RN Entered By: Carlene Coria on 06/21/2021 15:01:51 [name], [name] ([id number]) -------------------------------------------------------------------------------- Wound Assessment Details Patient Name: [name], [name] Date of Service: 06/21/2021 2:15 PM Medical Record Number: [id number] Patient Account Number: [id number] Date of Birth/Sex: [birth year] (80 y.o. M) Treating RN: Carlene Coria Primary Care Sharlena Kristensen: Jenna Luo Other Clinician: Referring Sila Sarsfield: Jenna Luo Treating Lyberti Thrush/Extender: Skipper Cliche in Treatment: 4 Wound Status Wound Number: 2 Primary Etiology: Calciphylaxis Wound Location: Penis Wound Status: Open Wounding Event: Gradually Appeared Comorbid History: Coronary Artery Disease, Type II Diabetes Date Acquired: 05/05/2021 Weeks Of Treatment: 2 Clustered Wound: No Photos Wound Measurements Length: (cm) 2 Width: (cm) 2 Depth: (cm) 0.1 Area: (cm) 3.142 Volume: (cm) 0.314 % Reduction in Area: 33.3% % Reduction in Volume: 33.3% Epithelialization: None Tunneling: No Undermining: No Wound Description Classification: Full Thickness Without Exposed Support Struc Exudate Amount: Medium Exudate Type: Serosanguineous Exudate Color: red, brown tures Foul Odor After Cleansing: No Slough/Fibrino Yes Wound Bed Necrotic Amount: Large (67-100%) Exposed Structure Necrotic Quality: Eschar, Adherent Slough Fascia Exposed: No Fat Layer (Subcutaneous Tissue) Exposed: Yes Tendon Exposed: No Muscle Exposed: No Joint Exposed: No Bone Exposed: No Treatment Notes Wound #2 (Penis) Cleanser Peri-Wound Care Topical Primary Dressing Frier, Dael L. ([id number]) Gauze Discharge Instruction: moistened with santyl Santyl Collagenase Ointment, 30 (gm), tube Secondary Dressing Secured With Compression Wrap Compression Stockings Add-Ons Electronic Signature(s) Signed: 06/22/2021 10:13:49 AM By: Carlene Coria RN Entered By: Carlene Coria on 06/21/2021 15:01:24 Barrette, Cameryn L. ([id number]) -------------------------------------------------------------------------------- Vitals Details Patient Name: [name] Morale L. Date of Service: 06/21/2021 2:15 PM Medical Record Number: [id number] Patient Account Number: [id number] Date of Birth/Sex: 10/17/1940 (80 y.o. M) Treating RN: Carlene Coria Primary Care Dace Denn: Jenna Luo Other Clinician: Referring Teoman Giraud: Jenna Luo Treating Kelvis Berger/Extender: Skipper Cliche in Treatment: 4 Vital Signs Time  Taken: 14:45 Temperature (F): 98.2 Height (in): 68 Pulse (bpm): 74 Weight (lbs): 172 Respiratory Rate (breaths/min): 18 Body Mass Index (BMI): 26.1 Blood Pressure (mmHg): 145/79 Reference Range: 80 - 120 mg / dl Electronic Signature(s) Signed: 06/22/2021 10:13:49 AM By: Carlene Coria RN Entered By: Carlene Coria on 06/21/2021 14:46:10

## 2021-06-25 ENCOUNTER — Encounter: Payer: Self-pay | Admitting: Family Medicine

## 2021-06-25 DIAGNOSIS — D689 Coagulation defect, unspecified: Secondary | ICD-10-CM | POA: Diagnosis not present

## 2021-06-25 DIAGNOSIS — N2581 Secondary hyperparathyroidism of renal origin: Secondary | ICD-10-CM | POA: Diagnosis not present

## 2021-06-25 DIAGNOSIS — N186 End stage renal disease: Secondary | ICD-10-CM | POA: Diagnosis not present

## 2021-06-25 DIAGNOSIS — E876 Hypokalemia: Secondary | ICD-10-CM | POA: Diagnosis not present

## 2021-06-25 DIAGNOSIS — D509 Iron deficiency anemia, unspecified: Secondary | ICD-10-CM | POA: Diagnosis not present

## 2021-06-25 DIAGNOSIS — Z992 Dependence on renal dialysis: Secondary | ICD-10-CM | POA: Diagnosis not present

## 2021-06-25 MED ORDER — HYDROCODONE-ACETAMINOPHEN 5-325 MG PO TABS: 2.0000 | ORAL_TABLET | Freq: Four times a day (QID) | ORAL | 0 refills | Status: DC | PRN

## 2021-06-25 NOTE — Telephone Encounter (Signed)
PDMP reviewed.  Patient has been requiring pain medication for acute wound.  Prescription sent into pharmacy in place of PCP who is out of the office.

## 2021-06-25 NOTE — Telephone Encounter (Signed)
LOV 05/08/21 Last refill 06/11/21, #120, 0 refills  Please review, thanks!

## 2021-06-26 DIAGNOSIS — I10 Essential (primary) hypertension: Secondary | ICD-10-CM | POA: Diagnosis not present

## 2021-06-26 DIAGNOSIS — L97921 Non-pressure chronic ulcer of unspecified part of left lower leg limited to breakdown of skin: Secondary | ICD-10-CM | POA: Diagnosis not present

## 2021-06-26 DIAGNOSIS — E1122 Type 2 diabetes mellitus with diabetic chronic kidney disease: Secondary | ICD-10-CM | POA: Diagnosis not present

## 2021-06-26 DIAGNOSIS — E113293 Type 2 diabetes mellitus with mild nonproliferative diabetic retinopathy without macular edema, bilateral: Secondary | ICD-10-CM | POA: Diagnosis not present

## 2021-06-26 DIAGNOSIS — E1142 Type 2 diabetes mellitus with diabetic polyneuropathy: Secondary | ICD-10-CM | POA: Diagnosis not present

## 2021-06-26 DIAGNOSIS — N186 End stage renal disease: Secondary | ICD-10-CM | POA: Diagnosis not present

## 2021-06-26 DIAGNOSIS — E1159 Type 2 diabetes mellitus with other circulatory complications: Secondary | ICD-10-CM | POA: Diagnosis not present

## 2021-06-26 DIAGNOSIS — Z992 Dependence on renal dialysis: Secondary | ICD-10-CM | POA: Diagnosis not present

## 2021-06-26 DIAGNOSIS — Z794 Long term (current) use of insulin: Secondary | ICD-10-CM | POA: Diagnosis not present

## 2021-06-30 DIAGNOSIS — G9341 Metabolic encephalopathy: Secondary | ICD-10-CM | POA: Diagnosis not present

## 2021-06-30 DIAGNOSIS — A419 Sepsis, unspecified organism: Secondary | ICD-10-CM | POA: Diagnosis not present

## 2021-06-30 DIAGNOSIS — N186 End stage renal disease: Secondary | ICD-10-CM | POA: Diagnosis not present

## 2021-06-30 DIAGNOSIS — N189 Chronic kidney disease, unspecified: Secondary | ICD-10-CM | POA: Diagnosis not present

## 2021-07-02 DIAGNOSIS — Z992 Dependence on renal dialysis: Secondary | ICD-10-CM | POA: Diagnosis not present

## 2021-07-02 DIAGNOSIS — D509 Iron deficiency anemia, unspecified: Secondary | ICD-10-CM | POA: Diagnosis not present

## 2021-07-02 DIAGNOSIS — D689 Coagulation defect, unspecified: Secondary | ICD-10-CM | POA: Diagnosis not present

## 2021-07-02 DIAGNOSIS — N2581 Secondary hyperparathyroidism of renal origin: Secondary | ICD-10-CM | POA: Diagnosis not present

## 2021-07-02 DIAGNOSIS — E876 Hypokalemia: Secondary | ICD-10-CM | POA: Diagnosis not present

## 2021-07-02 DIAGNOSIS — N186 End stage renal disease: Secondary | ICD-10-CM | POA: Diagnosis not present

## 2021-07-03 ENCOUNTER — Ambulatory Visit: Payer: PPO | Admitting: Urology

## 2021-07-04 DIAGNOSIS — Z992 Dependence on renal dialysis: Secondary | ICD-10-CM | POA: Diagnosis not present

## 2021-07-04 DIAGNOSIS — E1122 Type 2 diabetes mellitus with diabetic chronic kidney disease: Secondary | ICD-10-CM | POA: Diagnosis not present

## 2021-07-04 DIAGNOSIS — N186 End stage renal disease: Secondary | ICD-10-CM | POA: Diagnosis not present

## 2021-07-05 ENCOUNTER — Other Ambulatory Visit: Payer: Self-pay

## 2021-07-05 ENCOUNTER — Encounter: Payer: PPO | Attending: Physician Assistant | Admitting: Physician Assistant

## 2021-07-05 DIAGNOSIS — L97222 Non-pressure chronic ulcer of left calf with fat layer exposed: Secondary | ICD-10-CM | POA: Diagnosis not present

## 2021-07-05 DIAGNOSIS — L97822 Non-pressure chronic ulcer of other part of left lower leg with fat layer exposed: Secondary | ICD-10-CM | POA: Insufficient documentation

## 2021-07-05 DIAGNOSIS — N184 Chronic kidney disease, stage 4 (severe): Secondary | ICD-10-CM | POA: Insufficient documentation

## 2021-07-05 DIAGNOSIS — L98492 Non-pressure chronic ulcer of skin of other sites with fat layer exposed: Secondary | ICD-10-CM | POA: Insufficient documentation

## 2021-07-05 DIAGNOSIS — Z992 Dependence on renal dialysis: Secondary | ICD-10-CM | POA: Insufficient documentation

## 2021-07-05 DIAGNOSIS — I2510 Atherosclerotic heart disease of native coronary artery without angina pectoris: Secondary | ICD-10-CM | POA: Insufficient documentation

## 2021-07-05 NOTE — Progress Notes (Addendum)
OSEI, ANGER ([id number]) Visit Report for 07/05/2021 Arrival Information Details Patient Name: [name], [name]. Date of Service: 07/05/2021 1:15 PM Medical Record Number: [id number] Patient Account Number: [id number] Date of Birth/Sex: May 22, 1941 (80 y.o. M) Treating RN: Carlene Coria Primary Care Shyah Cadmus: Jenna Luo Other Clinician: Referring Jameisha Stofko: Jenna Luo Treating Eveleen Mcnear/Extender: Skipper Cliche in Treatment: 6 Visit Information History Since Last Visit All ordered tests and consults were completed: No Patient Arrived: Wheel Chair Added or deleted any medications: No Arrival Time: 13:22 Any new allergies or adverse reactions: No Accompanied By: wife Had a fall or experienced change in No Transfer Assistance: None activities of daily living that may affect Patient Identification Verified: Yes risk of falls: Secondary Verification Process Completed: Yes Signs or symptoms of abuse/neglect since last visito No Patient Requires Transmission-Based Precautions: No Hospitalized since last visit: No Patient Has Alerts: No Implantable device outside of the clinic excluding No cellular tissue based products placed in the center since last visit: Has Dressing in Place as Prescribed: Yes Pain Present Now: Yes Electronic Signature(s) Signed: 07/05/2021 4:59:09 PM By: Carlene Coria RN Entered By: Carlene Coria on 07/05/2021 13:27:41 [place], [name] ([id number]) -------------------------------------------------------------------------------- Clinic Level of Care Assessment Details Patient Name: Mark Morale L. Date of Service: 07/05/2021 1:15 PM Medical Record Number: [id number] Patient Account Number: [id number] Date of Birth/Sex: 1941-05-09 (80 y.o. M) Treating RN: Carlene Coria Primary Care Elonzo Sopp: Jenna Luo Other Clinician: Referring Juanantonio Stolar: Jenna Luo Treating Shyhiem Beeney/Extender: Skipper Cliche in Treatment: 6 Clinic Level of Care  Assessment Items TOOL 1 Quantity Score []  - Use when EandM and Procedure is performed on INITIAL visit 0 ASSESSMENTS - Nursing Assessment / Reassessment []  - General Physical Exam (combine w/ comprehensive assessment (listed just below) when performed on new 0 pt. evals) []  - 0 Comprehensive Assessment (HX, ROS, Risk Assessments, Wounds Hx, etc.) ASSESSMENTS - Wound and Skin Assessment / Reassessment []  - Dermatologic / Skin Assessment (not related to wound area) 0 ASSESSMENTS - Ostomy and/or Continence Assessment and Care []  - Incontinence Assessment and Management 0 []  - 0 Ostomy Care Assessment and Management (repouching, etc.) PROCESS - Coordination of Care []  - Simple Patient / Family Education for ongoing care 0 []  - 0 Complex (extensive) Patient / Family Education for ongoing care []  - 0 Staff obtains Programmer, systems, Records, Test Results / Process Orders []  - 0 Staff telephones HHA, Nursing Homes / Clarify orders / etc []  - 0 Routine Transfer to another Facility (non-emergent condition) []  - 0 Routine Hospital Admission (non-emergent condition) []  - 0 New Admissions / Biomedical engineer / Ordering NPWT, Apligraf, etc. []  - 0 Emergency Hospital Admission (emergent condition) PROCESS - Special Needs []  - Pediatric / Minor Patient Management 0 []  - 0 Isolation Patient Management []  - 0 Hearing / Language / Visual special needs []  - 0 Assessment of Community assistance (transportation, D/C planning, etc.) []  - 0 Additional assistance / Altered mentation []  - 0 Support Surface(s) Assessment (bed, cushion, seat, etc.) INTERVENTIONS - Miscellaneous []  - External ear exam 0 []  - 0 Patient Transfer (multiple staff / Civil Service fast streamer / Similar devices) []  - 0 Simple Staple / Suture removal (25 or less) []  - 0 Complex Staple / Suture removal (26 or more) []  - 0 Hypo/Hyperglycemic Management (do not check if billed separately) []  - 0 Ankle / Brachial Index (ABI) - do not  check if billed separately Has the patient been seen at the hospital within the last three years: Yes Total Score: 0  Level Of Care: ____ [name] ([id number]) Electronic Signature(s) Signed: 07/05/2021 4:59:09 PM By: Carlene Coria RN Entered By: Carlene Coria on 07/05/2021 14:13:06 [name], [name] Kitchen ([id number]) -------------------------------------------------------------------------------- Encounter Discharge Information Details Patient Name: Mark Morale L. Date of Service: 07/05/2021 1:15 PM Medical Record Number: [id number] Patient Account Number: [id number] Date of Birth/Sex: 10-May-1941 (80 y.o. M) Treating RN: Carlene Coria Primary Care Brack Shaddock: Jenna Luo Other Clinician: Referring Verenis Nicosia: Jenna Luo Treating Presli Fanguy/Extender: Skipper Cliche in Treatment: 6 Encounter Discharge Information Items Post Procedure Vitals Discharge Condition: Stable Temperature (F): 99 Ambulatory Status: Wheelchair Pulse (bpm): 82 Discharge Destination: Home Respiratory Rate (breaths/min): 18 Transportation: Private Auto Blood Pressure (mmHg): 133/73 Accompanied By: wife Schedule Follow-up Appointment: Yes Clinical Summary of Care: Patient Declined Electronic Signature(s) Signed: 07/05/2021 4:59:09 PM By: Carlene Coria RN Entered By: Carlene Coria on 07/05/2021 14:23:27 [name], [name] ([id number]) -------------------------------------------------------------------------------- Lower Extremity Assessment Details Patient Name: [name], [name] Date of Service: 07/05/2021 1:15 PM Medical Record Number: [id number] Patient Account Number: [id number] Date of Birth/Sex: 1940-09-30 (80 y.o. M) Treating RN: Carlene Coria Primary Care Calvin Chura: Jenna Luo Other Clinician: Referring Poonam Woehrle: Jenna Luo Treating Dakhari Zuver/Extender: Skipper Cliche in Treatment: 6 Edema Assessment Assessed: [Left: No] [Right: No] [Left: Edema] [Right: :] Calf Left:  Right: Point of Measurement: 34 cm From Medial Instep 35 cm Ankle Left: Right: Point of Measurement: 9 cm From Medial Instep 20 cm Electronic Signature(s) Signed: 07/05/2021 4:59:09 PM By: Carlene Coria RN Entered By: Carlene Coria on 07/05/2021 13:41:54 [name], [name] ([id number]) -------------------------------------------------------------------------------- Multi Wound Chart Details Patient Name: Mark Morale L. Date of Service: 07/05/2021 1:15 PM Medical Record Number: [id number] Patient Account Number: [id number] Date of Birth/Sex: Jan 31, 1941 (80 y.o. M) Treating RN: Carlene Coria Primary Care Mariza Bourget: Jenna Luo Other Clinician: Referring Tanvi Gatling: Jenna Luo Treating Tillie Viverette/Extender: Skipper Cliche in Treatment: 6 Vital Signs Height(in): 68 Pulse(bpm): 67 Weight(lbs): 172 Blood Pressure(mmHg): 133/73 Body Mass Index(BMI): 26 Temperature(F): 99 Respiratory Rate(breaths/min): 18 Photos: [2:No Photos] [N/A:N/A] Wound Location: Left, Posterior Lower Leg Penis N/A Wounding Event: Gradually Appeared Gradually Appeared N/A Primary Etiology: Calciphylaxis Calciphylaxis N/A Comorbid History: Coronary Artery Disease, Type II Coronary Artery Disease, Type II N/A Diabetes Diabetes Date Acquired: 04/05/2021 05/05/2021 N/A Weeks of Treatment: 6 4 N/A Wound Status: Open Open N/A Clustered Wound: Yes No N/A Measurements L x W x D (cm) 20x15x0.4 1.5x1.5x0.1 N/A Area (cm) : 235.619 1.767 N/A Volume (cm) : 94.248 0.177 N/A % Reduction in Area: -36.40% 62.50% N/A % Reduction in Volume: -36.40% 62.40% N/A Classification: Full Thickness Without Exposed Full Thickness Without Exposed N/A Support Structures Support Structures Exudate Amount: Medium Medium N/A Exudate Type: Serosanguineous Serosanguineous N/A Exudate Color: red, brown red, brown N/A Granulation Amount: Small (1-33%) Medium (34-66%) N/A Granulation Quality: Red, Pink N/A N/A Necrotic Amount: Large  (67-100%) Medium (34-66%) N/A Necrotic Tissue: Eschar, Adherent [place]ley N/A Exposed Structures: Fat Layer (Subcutaneous Tissue): Fat Layer (Subcutaneous Tissue): N/A Yes Yes Fascia: No Fascia: No Tendon: No Tendon: No Muscle: No Muscle: No Joint: No Joint: No Bone: No Bone: No Epithelialization: None None N/A Treatment Notes Electronic Signature(s) Signed: 07/05/2021 4:59:09 PM By: Carlene Coria RN Entered By: Carlene Coria on 07/05/2021 14:08:10 [name], [name] ([id number]) -------------------------------------------------------------------------------- [place] Details Patient Name: Mark Morale L. Date of Service: 07/05/2021 1:15 PM Medical Record Number: [id number] Patient Account Number: [id number] Date of Birth/Sex: [birth year] (80 y.o. M) Treating RN: Carlene Coria Primary Care Kamare Caspers: Jenna Luo Other Clinician: Referring Carlie Corpus: Jenna Luo  Treating Aaidyn San/Extender: Skipper Cliche in Treatment: 6 Active Inactive Abuse / Safety / Falls / Self Care Management Nursing Diagnoses: Potential for injury related to falls Goals: Patient will remain injury free related to falls Date Initiated: 05/24/2021 Target Resolution Date: 06/24/2021 Goal Status: Active Interventions: Assess Activities of Daily Living upon admission and as needed Assess fall risk on admission and as needed Assess: immobility, friction, shearing, incontinence upon admission and as needed Assess impairment of mobility on admission and as needed per policy Assess personal safety and home safety (as indicated) on admission and as needed Assess self care needs on admission and as needed Notes: Nutrition Nursing Diagnoses: Impaired glucose control: actual or potential Goals: Patient/caregiver agrees to and verbalizes understanding of need to use nutritional supplements and/or vitamins as prescribed Date Initiated: 05/24/2021 Target  Resolution Date: 06/25/2021 Goal Status: Active Interventions: Assess HgA1c results as ordered upon admission and as needed Assess patient nutrition upon admission and as needed per policy Notes: Wound/Skin Impairment Nursing Diagnoses: Knowledge deficit related to ulceration/compromised skin integrity Goals: Patient/caregiver will verbalize understanding of skin care regimen Date Initiated: 05/24/2021 Target Resolution Date: 06/25/2021 Goal Status: Active Ulcer/skin breakdown will have a volume reduction of 30% by week 4 Date Initiated: 05/24/2021 Target Resolution Date: 07/24/2021 Goal Status: Active Ulcer/skin breakdown will have a volume reduction of 50% by week 8 Date Initiated: 05/24/2021 Target Resolution Date: 08/24/2021 Goal Status: Active Ulcer/skin breakdown will have a volume reduction of 80% by week 12 Date Initiated: 05/24/2021 Target Resolution Date: 09/24/2021 [name], [name] ([id number]) Goal Status: Active Ulcer/skin breakdown will heal within 14 weeks Date Initiated: 05/24/2021 Target Resolution Date: 10/22/2021 Goal Status: Active Interventions: Assess patient/caregiver ability to obtain necessary supplies Assess patient/caregiver ability to perform ulcer/skin care regimen upon admission and as needed Assess ulceration(s) every visit Notes: Electronic Signature(s) Signed: 07/05/2021 4:59:09 PM By: Carlene Coria RN Entered By: Carlene Coria on 07/05/2021 14:07:49 [name], [name] ([id number]) -------------------------------------------------------------------------------- Pain Assessment Details Patient Name: Mark Morale L. Date of Service: 07/05/2021 1:15 PM Medical Record Number: [id number] Patient Account Number: [id number] Date of Birth/Sex: [birth year] (80 y.o. M) Treating RN: Carlene Coria Primary Care [place] Welcome: Jenna Luo Other Clinician: Referring Skyelyn Scruggs: Jenna Luo Treating Amaka Gluth/Extender: Skipper Cliche in Treatment:  6 Active Problems Location of Pain Severity and Description of Pain Patient Has Paino Yes Site Locations With Dressing Change: Yes Duration of the Pain. Constant / Intermittento Intermittent How Long Does it Lasto Hours: Minutes: 15 Rate the pain. Current Pain Level: 3 Worst Pain Level: 5 Least Pain Level: 0 Tolerable Pain Level: 5 Character of Pain Describe the Pain: Aching Pain Management and Medication Current Pain Management: Medication: Yes Cold Application: No Rest: Yes Massage: No Activity: No T.E.N.S.: No Heat Application: No Leg drop or elevation: No Is the Current Pain Management Adequate: Inadequate How does your wound impact your activities of daily livingo Sleep: Yes Bathing: No Appetite: No Relationship With Others: No Bladder Continence: No Emotions: No Bowel Continence: No Work: No Toileting: No Drive: No Dressing: No Hobbies: No Electronic Signature(s) Signed: 07/05/2021 4:59:09 PM By: Carlene Coria RN Entered By: Carlene Coria on 07/05/2021 13:28:52 [name], [name] Kitchen ([id number]) -------------------------------------------------------------------------------- Patient/Caregiver Education Details Patient Name: Mark Morale L. Date of Service: 07/05/2021 1:15 PM Medical Record Number: [id number] Patient Account Number: [id number] Date of Birth/Gender: 10-Feb-1941 (80 y.o. M) Treating RN: Carlene Coria Primary Care Physician: Jenna Luo Other Clinician: Referring Physician: Jenna Luo Treating Physician/Extender: Skipper Cliche in Treatment: 6 Education Assessment Education Provided To:  Patient Education Topics Provided Wound/Skin Impairment: Methods: Explain/Verbal Responses: State content correctly Electronic Signature(s) Signed: 07/05/2021 4:59:09 PM By: Carlene Coria RN Entered By: Carlene Coria on 07/05/2021 14:22:30 [name], [name]  ([id number]) -------------------------------------------------------------------------------- Wound Assessment Details Patient Name: [name], Gurpreet L. Date of Service: 07/05/2021 1:15 PM Medical Record Number: [id number] Patient Account Number: [id number] Date of Birth/Sex: 1941/04/04 (80 y.o. M) Treating RN: Carlene Coria Primary Care Jazalynn Mireles: Jenna Luo Other Clinician: Referring Brihany Butch: Jenna Luo Treating Dartanian Knaggs/Extender: Skipper Cliche in Treatment: 6 Wound Status Wound Number: 1 Primary Etiology: Calciphylaxis Wound Location: Left, Posterior Lower Leg Wound Status: Open Wounding Event: Gradually Appeared Comorbid History: Coronary Artery Disease, Type II Diabetes Date Acquired: 04/05/2021 Weeks Of Treatment: 6 Clustered Wound: Yes Photos Wound Measurements Length: (cm) 20 Width: (cm) 15 Depth: (cm) 0.4 Area: (cm) 235.619 Volume: (cm) 94.248 % Reduction in Area: -36.4% % Reduction in Volume: -36.4% Epithelialization: None Tunneling: No Undermining: No Wound Description Classification: Full Thickness Without Exposed Support Structures Exudate Amount: Medium Exudate Type: Serosanguineous Exudate Color: red, brown Foul Odor After Cleansing: No Slough/Fibrino Yes Wound Bed Granulation Amount: Small (1-33%) Exposed Structure Granulation Quality: Red, Pink Fascia Exposed: No Necrotic Amount: Large (67-100%) Fat Layer (Subcutaneous Tissue) Exposed: Yes Necrotic Quality: Eschar, Adherent Slough Tendon Exposed: No Muscle Exposed: No Joint Exposed: No Bone Exposed: No Treatment Notes Wound #1 (Lower Leg) Wound Laterality: Left, Posterior Cleanser Byram Ancillary Kit - 15 Day Supply Discharge Instruction: Use supplies as instructed; Kit contains: (15) Saline Bullets; (15) 3x3 Gauze; 15 pr Gloves Peri-Wound Care [place], [place]. ([id number]) Topical Primary Dressing Kerlix AMD Roll Dressing, 4.5x 4.1 (in/in) Discharge Instruction: moisten with  dakins Secondary Dressing Kerlix 4.5 x 4.1 (in/yd) Discharge Instruction: Apply Kerlix 4.5 x 4.1 (in/yd) as instructed Secured With 9M Medipore H Soft Cloth Surgical Tape, 2x2 (in/yd) Compression Wrap Compression Stockings Add-Ons Electronic Signature(s) Signed: 07/05/2021 4:59:09 PM By: Carlene Coria RN Entered By: Carlene Coria on 07/05/2021 13:39:59 [place], Yoshiharu L. ([id number]) -------------------------------------------------------------------------------- Wound Assessment Details Patient Name: Cranshaw, Rocko L. Date of Service: 07/05/2021 1:15 PM Medical Record Number: [id number] Patient Account Number: [id number] Date of Birth/Sex: [birth year] (80 y.o. M) Treating RN: Carlene Coria Primary Care Charvis Lightner: Jenna Luo Other Clinician: Referring Kentarius Partington: Jenna Luo Treating Araceli Arango/Extender: Skipper Cliche in Treatment: 6 Wound Status Wound Number: 2 Primary Etiology: Calciphylaxis Wound Location: Penis Wound Status: Open Wounding Event: Gradually Appeared Comorbid History: Coronary Artery Disease, Type II Diabetes Date Acquired: 05/05/2021 Weeks Of Treatment: 4 Clustered Wound: No Wound Measurements Length: (cm) 1.5 Width: (cm) 1.5 Depth: (cm) 0.1 Area: (cm) 1.767 Volume: (cm) 0.177 % Reduction in Area: 62.5% % Reduction in Volume: 62.4% Epithelialization: None Tunneling: No Undermining: No Wound Description Classification: Full Thickness Without Exposed Support Structures Exudate Amount: Medium Exudate Type: Serosanguineous Exudate Color: red, brown Foul Odor After Cleansing: No Slough/Fibrino Yes Wound Bed Granulation Amount: Medium (34-66%) Exposed Structure Necrotic Amount: Medium (34-66%) Fascia Exposed: No Necrotic Quality: Eschar, Adherent Slough Fat Layer (Subcutaneous Tissue) Exposed: Yes Tendon Exposed: No Muscle Exposed: No Joint Exposed: No Bone Exposed: No Treatment Notes Wound #2 (Penis) Cleanser Byram Ancillary Kit - 15 Day  Supply Discharge Instruction: Use supplies as instructed; Kit contains: (15) Saline Bullets; (15) 3x3 Gauze; 15 pr Gloves Peri-Wound Care Topical Primary Dressing Gauze Discharge Instruction: moistened with santyl Santyl Collagenase Ointment, 30 (gm), tube Secondary Dressing Secured With Compression Wrap Compression Stockings Add-Ons ACETON, KINNEAR ([id number]) Electronic Signature(s) Signed: 07/05/2021 4:59:09 PM By: Carlene Coria RN Entered By: Carlene Coria  on 07/05/2021 13:41:26 Mcclory, Ehsan L. ([id number]) -------------------------------------------------------------------------------- [place] Details Patient Name: Fan, Jaqualin L. Date of Service: 07/05/2021 1:15 PM Medical Record Number: [id number] Patient Account Number: [id number] Date of Birth/Sex: 20-Dec-1940 (80 y.o. M) Treating RN: Carlene Coria Primary Care Kaj Vasil: Jenna Luo Other Clinician: Referring Owain Eckerman: Jenna Luo Treating Anoushka Divito/Extender: Skipper Cliche in Treatment: 6 Vital Signs Time Taken: 13:27 Temperature (F): 99 Height (in): 68 Pulse (bpm): 82 Weight (lbs): 172 Respiratory Rate (breaths/min): 18 Body Mass Index (BMI): 26.1 Blood Pressure (mmHg): 133/73 Reference Range: 80 - 120 mg / dl Electronic Signature(s) Signed: 07/05/2021 4:59:09 PM By: Carlene Coria RN Entered By: Carlene Coria on 07/05/2021 13:28:10

## 2021-07-05 NOTE — Progress Notes (Addendum)
REVANTH, NEIDIG ([id number]) Visit Report for 07/05/2021 Chief Complaint Document Details Patient Name: [name]. Date of Service: 07/05/2021 1:15 PM Medical Record Number: [id number] Patient Account Number: [id number] Date of Birth/Sex: 07-Aug-1940 (80 y.o. 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 ([id number]) -------------------------------------------------------------------------------- Debridement Details Patient Name: Mark Morale L. Date of Service: 07/05/2021 1:15 PM Medical Record Number: [id number] Patient Account Number: [id number] Date of Birth/Sex: 1940-10-26 (80 y.o. M) Treating RN: Carlene Coria Primary Care Provider: Jenna Luo Other Clinician: Referring Provider: Jenna Luo Treating Provider/Extender: Skipper Cliche in Treatment: 6 Debridement Performed for Wound #1 Left,Posterior Lower Leg Assessment: Performed By: Physician Tommie Sams., PA-C Debridement Type: Debridement Level of Consciousness (Pre- Awake and Alert procedure): Pre-procedure Verification/Time Out Yes - 14:05 Taken: Start Time: 14:05 Pain Control: Lidocaine 4% Topical Solution Total Area Debrided (L x W): 5 (cm) x 5 (cm) = 25 (cm) Tissue and other material Viable, Non-Viable, Slough, Subcutaneous, Skin: Dermis , Skin: Epidermis, Slough debrided: Level: Skin/Subcutaneous Tissue Debridement Description: Excisional Instrument: Curette Bleeding: Minimum Hemostasis Achieved: Pressure End Time: 14:12 Procedural Pain: 0 Post Procedural Pain: 0 Response to Treatment: Procedure was  tolerated well Level of Consciousness (Post- Awake and Alert procedure): Post Debridement Measurements of Total Wound Length: (cm) 20 Width: (cm) 15 Depth: (cm) 0.4 Volume: (cm) 94.248 Character of Wound/Ulcer Post Debridement: Improved Post Procedure Diagnosis Same as Pre-procedure Electronic Signature(s) Signed: 07/05/2021 4:59:09 PM By: Carlene Coria RN Signed: 07/05/2021 5:34:12 PM By: Worthy Keeler PA-C Entered By: Carlene Coria on 07/05/2021 14:10:25 [name], [name] ([id number]) -------------------------------------------------------------------------------- HPI Details Patient Name: Mark Morale L. Date of Service: 07/05/2021 1:15 PM Medical Record Number: [id number] Patient Account Number: [id number] Date of Birth/Sex: 11/08/1940 (80 y.o. 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 This has been present for a couple of months currently he is on the sodium thiosulfate at dialysis. He tells me that they picked up on this quickly and have been managing it but he really has not had any help with anything else from the standpoint of progressing the wound itself. The patient does have again end-stage renal disease which currently is listed as stage IV but he is on renal dialysis. He also has coronary artery disease as well as going shortly for formal arterial studies although he did have a quick test of the tibial artery with his vascular doctor when he went in to check on his fistula for his arm and subsequently they did not feel like he had any hemodynamically unstable flow into the lower extremity this is good  news. Nonetheless I think that the biggest issue here is going to be getting some of the eschar off so that we get the wounds med headed in the appropriate direction. 05/31/2021 upon evaluation today patient appears to be doing well with regard to his leg ulceration with calciphylaxis. I am very pleased in this regard. Fortunately there  does not appear to be any signs of active infection at this time which is great news. No fevers, chills, nausea, vomiting, or diarrhea. 06/07/2021 upon evaluation today patient appears to be doing well with regard to his legs. Unfortunately he is having an issue here with his penis as well he did mention at the end of last visit. I suggested that he go see urology. However when he went to see the urologist they basically told him there was not anything they could do. They recommended that we needed to be the ones to take care of this. With that being said this is an unusual area that to be honest a lot of the traditional wound care products we use are not really good to be good for this region. Potentially Medihoney alginate could be a possibility also think Santyl could be a possibility. With that being said I think initially my suggestion is probably can be for [place] to attempt the Santyl to see if that will benefit the patient. 06/21/2021 upon evaluation today patient's wound bed actually showed signs of good granulation and epithelization at this point. Fortunately I do not see any signs of active infection systemically which is great news and his legs are doing awesome I think you are making great progress. The Santyl on the glans penis also has been of benefit this is looking tremendously better. Again the patient is pleased he also tells me the pain is significantly improved. Overall I think we are headed in the appropriate direction here. 07/05/2021 upon evaluation today patient appears to be doing well with regard to his wounds. Both the area on the tip of his  penis as well as the left lateral leg is doing well. There is some debridement this can be needed over the leg region. Fortunately I think that overall the Dakin's moistened gauze has done a great job here. No fevers, chills, nausea, vomiting, or diarrhea. Electronic Signature(s) Signed: 07/05/2021 2:20:10 PM By: Worthy Keeler PA-C Entered By: Worthy Keeler on 07/05/2021 14:20:10 [name], [name] ([id number]) -------------------------------------------------------------------------------- Physical Exam Details Patient Name: [name], [name] Date of Service: 07/05/2021 1:15 PM Medical Record Number: [id number] Patient Account Number: [id number] Date of Birth/Sex: 1941-02-28 (80 y.o. M) Treating RN: Carlene Coria Primary Care Provider: Jenna Luo Other Clinician: Referring Provider: Jenna Luo Treating Provider/Extender: Skipper Cliche in Treatment: 6 Constitutional Well-nourished and well-hydrated in no acute distress. Respiratory normal breathing without difficulty. Psychiatric this patient is able to make decisions and demonstrates good insight into disease process. Alert and Oriented x 3. pleasant and cooperative. Notes Upon inspection I did have to perform debridement in regard to the leg region. I was able to remove a significant amount of eschar which is great news this was not the entire area debrided but a smaller section that estimated to be around 5 cm x 5 cm. Electronic Signature(s) Signed: 07/05/2021 2:21:24 PM By: Worthy Keeler PA-C Entered By: Worthy Keeler on 07/05/2021 14:21:24 [name], [name] ([id number]) -------------------------------------------------------------------------------- Physician Orders Details Patient Name: Mark Morale L. Date of Service: 07/05/2021 1:15 PM Medical Record Number: [id number] Patient Account Number: [id number] Date of Birth/Sex: 1941-05-31 (80 y.o. 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 Edema Control - Lymphedema / Segmental Compressive Device / Other o Elevate, Exercise Daily and Avoid Standing for Long Periods of Time. o Elevate legs to the level of the heart and pump ankles as often as possible o Elevate leg(s) parallel to the floor when sitting. Wound Treatment Wound #1 - Lower Leg Wound Laterality: Left, Posterior Cleanser: Byram Ancillary Kit - 15 Day Supply (DME) (Generic) 1 x Per Day/30 Days Discharge Instructions: Use supplies as instructed; Kit contains: (15) Saline Bullets; (15) 3x3 Gauze; 15 pr Gloves Primary Dressing: Kerlix AMD Roll Dressing, 4.5x 4.1 (in/in) (DME) (Generic) 1 x Per Day/30 Days Discharge Instructions: moisten with dakins Secondary Dressing: Kerlix 4.5 x 4.1 (in/yd) (Generic) 1 x Per Day/30 Days Discharge Instructions: Apply Kerlix 4.5 x 4.1 (in/yd) as instructed Secured With: 37M Medipore H Soft Cloth Surgical Tape, 2x2 (in/yd) (DME) (Generic) 1 x Per Day/30 Days Wound #2 - Penis Cleanser: Byram Ancillary Kit - 15 Day Supply (DME) (Generic) 1 x Per Day/30 Days Discharge Instructions: Use supplies as instructed; Kit contains: (15) Saline Bullets; (15) 3x3 Gauze; 15 pr Gloves Primary Dressing: Gauze (DME) (Generic) 1 x Per Day/30 Days Discharge Instructions: moistened with santyl Primary Dressing:  Santyl Collagenase Ointment, 30 (gm), tube 1 x Per Day/30 Days Electronic Signature(s) Signed: 07/05/2021 4:59:09 PM By: Carlene Coria RN Signed: 07/05/2021 5:34:12 PM By: Worthy Keeler PA-C Entered By: Carlene Coria on 07/05/2021 14:21:54 [name], [name] ([id number]) -------------------------------------------------------------------------------- Problem List Details Patient Name: [name], [name] Date of Service: 07/05/2021 1:15 PM Medical Record Number: [id number] Patient Account Number: [id number] Date of Birth/Sex: 03-13-1941 (80 y.o. M) Treating RN: Carlene Coria Primary Care Provider: Jenna Luo Other Clinician: Referring Provider: Jenna Luo Treating Provider/Extender: Skipper Cliche in Treatment: 6 Active Problems ICD-10 Encounter Code Description Active Date MDM Diagnosis E83.50 Unspecified disorder of calcium metabolism 05/24/2021 No Yes L97.822 Non-pressure chronic ulcer of other part of left lower leg with fat layer 05/24/2021 No Yes exposed L98.492 Non-pressure chronic ulcer of skin of other sites with fat layer exposed 06/07/2021 No Yes Z99.2 Dependence on renal dialysis 05/24/2021 No Yes N18.4 Chronic kidney disease, stage 4 (severe) 05/24/2021 No Yes I25.10 Atherosclerotic heart disease of native coronary artery without angina 05/24/2021 No Yes pectoris Inactive Problems Resolved Problems Electronic Signature(s) Signed: 07/05/2021 1:03:20 PM By: Worthy Keeler PA-C Entered By: Worthy Keeler on 07/05/2021 13:03:20 [name], [name] ([id number]) -------------------------------------------------------------------------------- Progress Note Details Patient Name: [name], [name] Date of Service: 07/05/2021 1:15 PM Medical Record Number: [id number] Patient Account Number: [id number] Date of Birth/Sex: 06-03-1941 (80 y.o. 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 This has been present for a couple of months currently he is on the sodium thiosulfate at dialysis. He tells me that they picked up on this quickly and have been managing it but he really has not had any help with  anything else from the standpoint of progressing the wound itself. The patient does have again end-stage renal disease which currently is listed as stage IV but he is on renal dialysis. He also has coronary artery disease as well as going shortly for formal arterial studies although he did have a quick test of the tibial artery with his vascular doctor when he went in to check on his fistula for his arm and subsequently they did not feel like he had any hemodynamically unstable flow into the lower extremity this is good news. Nonetheless I think that the biggest issue here is going to be getting some of the eschar off so that we get the wounds med headed in the appropriate direction. 05/31/2021 upon evaluation today patient appears to be doing well with regard to his leg ulceration with calciphylaxis. I am very pleased in this regard. Fortunately there does not appear to be any signs of active infection at this time which is great news. No fevers, chills, nausea, vomiting, or diarrhea. 06/07/2021 upon evaluation today patient appears to be doing well with regard to his legs. Unfortunately he is having an issue here with his penis as well he did mention at the end of last visit. I suggested that he go see urology. However when he went to see the urologist they basically told him there was not anything they  could do. They recommended that we needed to be the ones to take care of this. With that being said this is an unusual area that to be honest a lot of the traditional wound care products we use are not really good to be good for this region. Potentially Medihoney alginate could be a possibility also think Santyl could be a possibility. With that being said I think initially my suggestion is probably can be for [place] to attempt the Santyl to see if that will benefit the patient. 06/21/2021 upon evaluation today patient's wound bed actually showed signs of good granulation and epithelization at this point. Fortunately I do not see any signs of active infection systemically which is great news and his legs are doing awesome I think you are making great progress. The Santyl on the glans penis also has been of benefit this is looking tremendously better. Again the patient is pleased he also tells me the pain is significantly improved. Overall I think we are headed in the appropriate direction here. 07/05/2021 upon evaluation today patient appears to be doing well with regard to his wounds. Both the area on the tip of his penis as well as the left lateral leg is doing well. There is some debridement this can be needed over the leg region. Fortunately I think that overall the Dakin's moistened gauze has done a great job here. No fevers, chills, nausea, vomiting, or diarrhea. Objective Constitutional Well-nourished and well-hydrated in no acute distress. Vitals Time Taken: 1:27 PM, Height: 68 in, Weight: 172 lbs, BMI: 26.1, Temperature: 99 F, Pulse: 82 bpm, Respiratory Rate: 18 breaths/min, Blood Pressure: 133/73 mmHg. Respiratory normal breathing without difficulty. Psychiatric this patient is able to make decisions and demonstrates good insight into disease process. Alert and Oriented x 3. pleasant and cooperative. General Notes: Upon inspection I did have to perform debridement in regard to the leg  region. I was able to remove a significant amount of eschar which is great news this was not the entire area debrided but a smaller section that estimated to be around 5 cm x 5 cm. Integumentary (Hair, Skin)  Wound #1 status is Open. Original cause of wound was Gradually Appeared. The date acquired was: 04/05/2021. The wound has been in treatment [name], [name] ([id number]) 6 weeks. The wound is located on the Left,Posterior Lower Leg. The wound measures 20cm length x 15cm width x 0.4cm depth; 235.619cm^2 area and 94.248cm^3 volume. There is Fat Layer (Subcutaneous Tissue) exposed. There is no tunneling or undermining noted. There is a medium amount of serosanguineous drainage noted. There is small (1-33%) red, pink granulation within the wound bed. There is a large (67-100%) amount of necrotic tissue within the wound bed including Eschar and Adherent Slough. Wound #2 status is Open. Original cause of wound was Gradually Appeared. The date acquired was: 05/05/2021. The wound has been in treatment 4 weeks. The wound is located on the Penis. The wound measures 1.5cm length x 1.5cm width x 0.1cm depth; 1.767cm^2 area and 0.177cm^3 volume. There is Fat Layer (Subcutaneous Tissue) exposed. There is no tunneling or undermining noted. There is a medium amount of serosanguineous drainage noted. There is medium (34-66%) granulation within the wound bed. There is a medium (34-66%) amount of necrotic tissue within the wound bed including Eschar and Adherent Slough. 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 There was a Excisional Skin/Subcutaneous Tissue Debridement with a total area of 25 sq cm performed by Tommie Sams., PA-C. With the following instrument(s): Curette to remove Viable and Non-Viable tissue/material. Material removed includes Subcutaneous Tissue, Slough, Skin: Dermis, and Skin: Epidermis after achieving pain control using Lidocaine 4% Topical Solution. No specimens were taken. A time out was conducted at 14:05, prior to the start of the procedure. A Minimum amount of bleeding was controlled with Pressure. The procedure was tolerated well with a pain level of 0 throughout and a pain level of 0 following the procedure. Post Debridement Measurements: 20cm length x 15cm width x 0.4cm depth; 94.248cm^3 volume. Character of Wound/Ulcer Post Debridement is improved. Post procedure Diagnosis Wound #1: Same as Pre-Procedure Plan Follow-up Appointments: Return Appointment in 1 week. Edema Control - Lymphedema / Segmental Compressive Device / Other: Elevate, Exercise Daily and Avoid Standing for Long Periods of Time. Elevate legs to the level of the heart and pump ankles as often as possible Elevate leg(s) parallel to the floor when sitting. WOUND #1: - Lower Leg Wound Laterality: Left, Posterior Primary Dressing: Kerlix AMD Roll Dressing, 4.5x 4.1 (in/in) (Generic) 1 x Per Day/30 Days Discharge Instructions: moisten with dakins Secondary Dressing: Kerlix 4.5 x 4.1 (in/yd) (Generic) 1 x Per Day/30 Days Discharge Instructions: Apply Kerlix 4.5 x 4.1 (in/yd) as instructed Secured With: 51M Medipore H Soft Cloth Surgical Tape, 2x2 (in/yd) (Generic) 1 x Per Day/30 Days WOUND #2: - Penis Wound Laterality: Primary Dressing: Gauze 1 x Per Day/30 Days Discharge Instructions: moistened with santyl Primary Dressing: Santyl Collagenase Ointment, 30 (gm), tube 1 x Per Day/30 Days 1. Would recommend currently that we go ahead and continue with  wound care measures as before and the patient is in agreement the plan. This includes the use of the Dakin's moistened gauze dressing to the wounds on the legs.  I think that this is doing a good job. 2. We will continue with the Santyl to the distal portion of the penis as that seems to be helping to clean out that region from the necrotic tissue standpoint and that is definitely what we want to see. [name], [name] ([id number]) We will see patient back for reevaluation in 1 week here in the clinic. If anything worsens or changes patient will contact our office for additional recommendations. Electronic Signature(s) Signed: 07/05/2021 2:22:33 PM By: Worthy Keeler PA-C Entered By: Worthy Keeler on 07/05/2021 14:22:32 [name], [name] Jews ([id number]) -------------------------------------------------------------------------------- SuperBill Details Patient Name: Mark Morale L. Date of Service: 07/05/2021 Medical Record Number: [id number] Patient Account Number: [id number] Date of Birth/Sex: 1941-07-25 (80 y.o. 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 Z99.2 Dependence on renal dialysis N18.4 Chronic kidney disease, stage 4 (severe) I25.10 Atherosclerotic heart disease of native coronary artery without angina pectoris Facility Procedures CPT4 Code: 17356701 Description: 41030 - DEB SUBQ TISSUE 20 SQ CM/< Modifier: Quantity: 1 CPT4 Code: Description: ICD-10 Diagnosis Description L97.822 Non-pressure chronic ulcer of other part of left lower leg with fat layer Modifier:  exposed Quantity: CPT4 Code: [id number] Description: 75797 - DEB SUBQ TISS EA ADDL 20CM Modifier: Quantity: 1 CPT4 Code: Description: ICD-10 Diagnosis Description L97.822 Non-pressure chronic ulcer of other part of left lower leg with fat layer Modifier: exposed Quantity: Physician Procedures CPT4 Code: 2820601 Description: 11042 - WC PHYS SUBQ TISS 20 SQ CM Modifier: Quantity: 1 CPT4 Code: Description: ICD-10 Diagnosis Description L97.822 Non-pressure chronic ulcer of other part of left lower leg with fat layer Modifier: exposed Quantity: CPT4 Code: 5615379 Description: 43276 - WC PHYS SUBQ TISS EA ADDL 20 CM Modifier: Quantity: 1 CPT4 Code: Description: ICD-10 Diagnosis Description L97.822 Non-pressure chronic ulcer of other part of left lower leg with fat layer Modifier: exposed Quantity: Electronic Signature(s) Signed: 07/05/2021 2:22:51 PM By: Worthy Keeler PA-C Entered By: Worthy Keeler on 07/05/2021 14:22:50

## 2021-07-06 DIAGNOSIS — N186 End stage renal disease: Secondary | ICD-10-CM | POA: Diagnosis not present

## 2021-07-06 DIAGNOSIS — D509 Iron deficiency anemia, unspecified: Secondary | ICD-10-CM | POA: Diagnosis not present

## 2021-07-06 DIAGNOSIS — D689 Coagulation defect, unspecified: Secondary | ICD-10-CM | POA: Diagnosis not present

## 2021-07-06 DIAGNOSIS — N454 Abscess of epididymis or testis: Secondary | ICD-10-CM | POA: Diagnosis not present

## 2021-07-06 DIAGNOSIS — D631 Anemia in chronic kidney disease: Secondary | ICD-10-CM | POA: Diagnosis not present

## 2021-07-06 DIAGNOSIS — N2581 Secondary hyperparathyroidism of renal origin: Secondary | ICD-10-CM | POA: Diagnosis not present

## 2021-07-06 DIAGNOSIS — Z992 Dependence on renal dialysis: Secondary | ICD-10-CM | POA: Diagnosis not present

## 2021-07-09 DIAGNOSIS — Z992 Dependence on renal dialysis: Secondary | ICD-10-CM | POA: Diagnosis not present

## 2021-07-09 DIAGNOSIS — D689 Coagulation defect, unspecified: Secondary | ICD-10-CM | POA: Diagnosis not present

## 2021-07-09 DIAGNOSIS — D509 Iron deficiency anemia, unspecified: Secondary | ICD-10-CM | POA: Diagnosis not present

## 2021-07-09 DIAGNOSIS — N2581 Secondary hyperparathyroidism of renal origin: Secondary | ICD-10-CM | POA: Diagnosis not present

## 2021-07-09 DIAGNOSIS — N186 End stage renal disease: Secondary | ICD-10-CM | POA: Diagnosis not present

## 2021-07-09 DIAGNOSIS — E876 Hypokalemia: Secondary | ICD-10-CM | POA: Diagnosis not present

## 2021-07-12 ENCOUNTER — Encounter: Payer: PPO | Admitting: Physician Assistant

## 2021-07-12 ENCOUNTER — Other Ambulatory Visit: Payer: Self-pay

## 2021-07-12 DIAGNOSIS — L97822 Non-pressure chronic ulcer of other part of left lower leg with fat layer exposed: Secondary | ICD-10-CM | POA: Diagnosis not present

## 2021-07-12 DIAGNOSIS — L97222 Non-pressure chronic ulcer of left calf with fat layer exposed: Secondary | ICD-10-CM | POA: Diagnosis not present

## 2021-07-12 NOTE — Progress Notes (Addendum)
THONG, FEENY ([id number]) Visit Report for 07/12/2021 Chief Complaint Document Details Patient Name: [name], [name]. Date of Service: 07/12/2021 1:15 PM Medical Record Number: [id number] Patient Account Number: [id number] Date of Birth/Sex: Feb 18, 1941 (80 y.o. 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 ([id number]) -------------------------------------------------------------------------------- Debridement Details Patient Name: [name]. Date of Service: 07/12/2021 1:15 PM Medical Record Number: [id number] Patient Account Number: [id number] Date of Birth/Sex: 12-04-1940 (80 y.o. M) Treating RN: Carlene Coria Primary Care Provider: Jenna Luo Other Clinician: Referring Provider: Jenna Luo Treating Provider/Extender: Skipper Cliche in Treatment: 7 Debridement Performed for Wound #1 Left,Posterior Lower Leg Assessment: Performed By: Physician Tommie Sams., PA-C Debridement Type: Debridement Level of Consciousness (Pre- Awake and Alert procedure): Pre-procedure Verification/Time Out Yes - 13:52 Taken: Start Time: 13:52 Pain Control: Lidocaine 4% Topical Solution Total Area Debrided ([name] x W): 3 (cm) x 3 (cm) = 9 (cm) Tissue and other material Viable, Non-Viable, Eschar, Subcutaneous debrided: Level: Skin/Subcutaneous Tissue Debridement Description: Excisional Instrument: Forceps, Scissors Bleeding: Minimum Hemostasis Achieved: Pressure End Time: 14:00 Procedural Pain: 0 Post Procedural Pain: 0 Response to Treatment: Procedure was tolerated well Level of  Consciousness (Post- Awake and Alert procedure): Post Debridement Measurements of Total Wound Length: (cm) 20 Width: (cm) 14 Depth: (cm) 0.4 Volume: (cm) 87.965 Character of Wound/Ulcer Post Debridement: Improved Post Procedure Diagnosis Same as Pre-procedure Electronic Signature(s) Signed: 07/12/2021 4:34:48 PM By: Carlene Coria RN Signed: 07/12/2021 6:23:09 PM By: Worthy Keeler PA-C Entered By: Carlene Coria on 07/12/2021 13:53:51 [name], [name]. ([id number]) -------------------------------------------------------------------------------- Debridement Details Patient Name: [name], [name]. Date of Service: 07/12/2021 1:15 PM Medical Record Number: [id number] Patient Account Number: [id number] Date of Birth/Sex: 06-06-1941 (80 y.o. M) Treating RN: Carlene Coria Primary Care Provider: Jenna Luo Other Clinician: Referring Provider: Jenna Luo Treating Provider/Extender: Skipper Cliche in Treatment: 7 Debridement Performed for Wound #2 Penis Assessment: Performed By: Physician Tommie Sams., PA-C Debridement Type: Chemical/Enzymatic/Mechanical Agent Used: Level of Consciousness (Pre- Awake and Alert procedure): Pre-procedure Verification/Time Out Yes - 13:52 Taken: Start Time: 13:52 Bleeding: Minimum Hemostasis Achieved: Pressure End Time: 14:00 Procedural Pain: 0 Post Procedural Pain: 0 Response to Treatment: Procedure was tolerated well Level of Consciousness (Post- Awake and Alert procedure): Post Debridement Measurements of Total Wound Length: (cm) 1 Width: (cm) 1 Depth: (cm) 0.1 Volume: (cm) 0.079 Character of Wound/Ulcer Post Debridement: Improved Post Procedure Diagnosis Same as Pre-procedure Electronic Signature(s) Signed: 07/13/2021 8:20:20 AM By: Carlene Coria RN Signed: 07/13/2021 10:25:26 AM By: Worthy Keeler PA-C Entered By: Carlene Coria on 07/13/2021 08:20:20 [name].  ([id number]) -------------------------------------------------------------------------------- HPI Details Patient Name: [name], [name]. Date of Service: 07/12/2021 1:15 PM Medical Record Number: [id number] Patient Account Number: [id number] Date of Birth/Sex: 1941/02/01 (80 y.o. 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 This has been present for a couple of months currently he is on the sodium thiosulfate at dialysis. He  tells me that they picked up on this quickly and have been managing it but he really has not had any help with anything else from the standpoint of progressing the wound itself. The patient does have again end-stage renal disease which currently is listed as stage IV but he is on renal dialysis. He also has coronary artery disease as well as going shortly for formal arterial studies although he did have a quick test of the tibial artery with his vascular doctor when he went in to check on his fistula for his arm and subsequently they did not feel like he had any hemodynamically unstable flow into the lower extremity this is good news. Nonetheless I think that the biggest issue here is going to be getting some of the eschar off so that we get the wounds med headed in the appropriate direction. 05/31/2021 upon evaluation today patient appears to be doing well with regard to his leg ulceration with calciphylaxis. I am very pleased in this regard. Fortunately there does not appear to be any signs of active infection at this time which is great news. No fevers, chills, nausea, vomiting, or diarrhea. 06/07/2021 upon  evaluation today patient appears to be doing well with regard to his legs. Unfortunately he is having an issue here with his penis as well he did mention at the end of last visit. I suggested that he go see urology. However when he went to see the urologist they basically told him there was not anything they could do. They recommended that we needed to be the ones to take care of this. With that being said this is an unusual area that to be honest a lot of the traditional wound care products we use are not really good to be good for this region. Potentially Medihoney alginate could be a possibility also think Santyl could be a possibility. With that being said I think initially my suggestion is probably can be for [place] to attempt the Santyl to see if that will benefit the patient. 06/21/2021 upon evaluation today patient's wound bed actually showed signs of good granulation and epithelization at this point. Fortunately I do not see any signs of active infection systemically which is great news and his legs are doing awesome I think you are making great progress. The Santyl on the glans penis also has been of benefit this is looking tremendously better. Again the patient is pleased he also tells me the pain is significantly improved. Overall I think we are headed in the appropriate direction here. 07/05/2021 upon evaluation today patient appears to be doing well with regard to his wounds. Both the area on the tip of his penis as well as the left lateral leg is doing well. There is some debridement this can be needed over the leg region. Fortunately I think that overall the Dakin's moistened gauze has done a great job here. No fevers, chills, nausea, vomiting, or diarrhea. 07/12/2021 upon evaluation today patient presents for reevaluation here in the clinic and overall I think that his wounds are doing significantly better which is great news. This includes both the left leg as well as the glans of the  penis. Both are showing signs of significant improvement which is great and overall I think that we are headed in the right direction. I do not see any evidence of active infection at this moment. Electronic Signature(s) Signed: 07/12/2021 5:46:04 PM By: Worthy Keeler PA-C Entered By: Worthy Keeler on 07/12/2021 17:46:03 [name],  [name]. ([id number]) -------------------------------------------------------------------------------- Physical Exam Details Patient Name: [name], [name]. Date of Service: 07/12/2021 1:15 PM Medical Record Number: [id number] Patient Account Number: [id number] Date of Birth/Sex: September 01, 1940 (80 y.o. M) Treating RN: Carlene Coria Primary Care Provider: Jenna Luo Other Clinician: Referring Provider: Jenna Luo Treating Provider/Extender: Skipper Cliche in Treatment: 7 Constitutional Well-nourished and well-hydrated in no acute distress. Respiratory normal breathing without difficulty. Psychiatric this patient is able to make decisions and demonstrates good insight into disease process. Alert and Oriented x 3. pleasant and cooperative. Notes Upon inspection patient's wound bed actually showed signs of good granulation epithelization I did have to perform some sharp debridement in regard to the leg this was just a very small area compared to what we had to do in the past and overall I think that in general he seems to be doing quite well to be honest. Electronic Signature(s) Signed: 07/12/2021 5:46:21 PM By: Worthy Keeler PA-C Entered By: Worthy Keeler on 07/12/2021 17:46:21 [name], [name]. ([id number]) -------------------------------------------------------------------------------- Physician Orders Details Patient Name: [name]. Date of Service: 07/12/2021 1:15 PM Medical Record Number: [id number] Patient Account Number: [id number] Date of Birth/Sex: 03-29-1941 (80 y.o. 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 Edema Control - Lymphedema / Segmental Compressive Device / Other o Elevate, Exercise Daily and Avoid Standing for Long Periods of Time. o Elevate legs to the level of the heart and pump ankles as often as possible o Elevate leg(s) parallel to the floor when sitting. Wound Treatment Wound #1 - Lower Leg Wound Laterality: Left, Posterior Cleanser: Byram Ancillary Kit - 15 Day Supply (Generic) 1 x Per Day/30 Days Discharge Instructions: Use supplies as instructed; Kit contains: (15) Saline Bullets; (15) 3x3 Gauze; 15 pr Gloves Primary Dressing: Kerlix AMD Roll Dressing, 4.5x 4.1 (in/in) (Generic) 1 x Per Day/30 Days Discharge Instructions: moisten with dakins Secondary Dressing: Kerlix 4.5 x 4.1 (in/yd) (Generic) 1 x Per Day/30 Days Discharge Instructions: Apply Kerlix 4.5 x 4.1 (in/yd) as instructed Secured With: 60M Medipore H Soft Cloth Surgical Tape, 2x2 (in/yd) (Generic) 1 x Per Day/30 Days Wound #2 - Penis Cleanser: Byram Ancillary Kit - 15 Day Supply (Generic) 1 x Per Day/30 Days Discharge Instructions: Use supplies as instructed; Kit contains: (15) Saline Bullets; (15) 3x3 Gauze; 15 pr Gloves Primary Dressing: Gauze (Generic) 1 x Per Day/30 Days Discharge Instructions: moistened with santyl Primary Dressing: Santyl Collagenase Ointment, 30  (gm), tube 1 x Per Day/30 Days Electronic Signature(s) Signed: 07/12/2021 4:34:48 PM By: Carlene Coria RN Signed: 07/12/2021 6:23:09 PM By: Worthy Keeler PA-C Entered By: Carlene Coria on 07/12/2021 13:54:10 [name], [name]. ([id number]) -------------------------------------------------------------------------------- Problem List Details Patient Name: [name], [name]. Date of Service: 07/12/2021 1:15 PM Medical Record Number: [id number] Patient Account Number: [id number] Date of Birth/Sex: May 14, 1941 (80 y.o. 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.50 Unspecified disorder of calcium metabolism 05/24/2021 No Yes L97.822 Non-pressure chronic ulcer of other part of left lower leg with fat layer 05/24/2021 No Yes exposed L98.492 Non-pressure chronic ulcer of skin of other sites with fat layer exposed 06/07/2021 No Yes Z99.2 Dependence on renal dialysis 05/24/2021 No Yes N18.4 Chronic kidney disease, stage 4 (severe) 05/24/2021 No Yes I25.10 Atherosclerotic heart disease of native coronary artery without angina 05/24/2021 No Yes pectoris Inactive Problems Resolved Problems Electronic Signature(s) Signed: 07/12/2021 1:34:04 PM By: Worthy Keeler PA-C Entered By: Worthy Keeler on 07/12/2021 13:34:04 [name], [name]. ([id number]) -------------------------------------------------------------------------------- Progress Note Details Patient Name: [name], [name]. Date of Service: 07/12/2021 1:15 PM Medical Record Number: [id number] Patient Account Number: [id number] Date of Birth/Sex: 01/29/1941 (80 y.o. 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 This has been present for a couple of months currently he is on the sodium thiosulfate at dialysis. He tells me that they picked up on this quickly and have been managing it but he really has not had any help with anything else from the standpoint of progressing the wound itself. The patient does have again end-stage renal disease which currently is listed as stage IV but he is on renal dialysis. He also has coronary artery disease as well as going shortly for formal arterial studies although he did have a quick test of the tibial artery with his vascular doctor when he went in to check on his fistula for his arm and subsequently they did not feel like he had any hemodynamically unstable flow into the lower extremity this is good news. Nonetheless I think that the biggest issue here is going to be getting some of the eschar off so that we get the wounds med headed in the appropriate direction. 05/31/2021 upon evaluation today patient appears to be doing well with regard to his leg ulceration with calciphylaxis. I am very pleased in this regard. Fortunately there does not appear to be any signs of active infection at this time which is great news. No fevers, chills, nausea, vomiting, or diarrhea. 06/07/2021 upon evaluation today patient appears to be doing well with regard to his legs. Unfortunately he is having an issue here with his penis as well he did mention at the end of last visit. I suggested that he go see urology. However when he went to see the urologist they basically told him there was not anything they could do. They recommended  that we needed to be the ones to take care of this. With that being said this is an unusual area that to be honest a lot of the traditional wound care products we use are not really good to be good for this region. Potentially Medihoney alginate could be a possibility also think Santyl could be a possibility. With that being said I think initially my suggestion is probably can be for [place] to attempt the Santyl to see if that will benefit the patient. 06/21/2021 upon evaluation today patient's wound bed actually showed signs of good granulation and epithelization at this point. Fortunately  I do not see any signs of active infection systemically which is great news and his legs are doing awesome I think you are making great progress. The Santyl on the glans penis also has been of benefit this is looking tremendously better. Again the patient is pleased he also tells me the pain is significantly improved. Overall I think we are headed in the appropriate direction here. 07/05/2021 upon evaluation today patient appears to be doing well with regard to his wounds. Both the area on the tip of his penis as well as the left lateral leg is doing well. There is some debridement this can be needed over the leg region. Fortunately I think that overall the Dakin's moistened gauze has done a great job here. No fevers, chills, nausea, vomiting, or diarrhea. 07/12/2021 upon evaluation today patient presents for reevaluation here in the clinic and overall I think that his wounds are doing significantly better which is great news. This includes both the left leg as well as the glans of the penis. Both are showing signs of significant improvement which is great and overall I think that we are headed in the right direction. I do not see any evidence of active infection at this moment. Objective Constitutional Well-nourished and well-hydrated in no acute distress. Vitals Time Taken: 1:22 PM, Height: 68 in, Weight: 172 lbs,  BMI: 26.1, Temperature: 99.1 F, Pulse: 83 bpm, Respiratory Rate: 18 breaths/min, Blood Pressure: 160/77 mmHg. Respiratory normal breathing without difficulty. Psychiatric this patient is able to make decisions and demonstrates good insight into disease process. Alert and Oriented x 3. pleasant and cooperative. General Notes: Upon inspection patient's wound bed actually showed signs of good granulation epithelization I did have to perform some sharp Dery, Deitrick [name]. ([id number]) debridement in regard to the leg this was just a very small area compared to what we had to do in the past and overall I think that in general he seems to be doing quite well to be honest. Integumentary (Hair, Skin) Wound #1 status is Open. Original cause of wound was Gradually Appeared. The date acquired was: 04/05/2021. The wound has been in treatment 7 weeks. The wound is located on the Left,Posterior Lower Leg. The wound measures 20cm length x 14cm width x 0.4cm depth; 219.911cm^2 area and 87.965cm^3 volume. There is Fat Layer (Subcutaneous Tissue) exposed. There is no tunneling or undermining noted. There is a medium amount of serosanguineous drainage noted. There is small (1-33%) red, pink granulation within the wound bed. There is a large (67-100%) amount of necrotic tissue within the wound bed including Eschar and Adherent Slough. Wound #2 status is Open. Original cause of wound was Gradually Appeared. The date acquired was: 05/05/2021. The wound has been in treatment 5 weeks. The wound is located on the Penis. The wound measures 1cm length x 1cm width x 0.1cm depth; 0.785cm^2 area and 0.079cm^3 volume. There is Fat Layer (Subcutaneous Tissue) exposed. There is no tunneling or undermining noted. There is a medium amount of serosanguineous drainage noted. There is medium (34-66%) granulation within the wound bed. There is a medium (34-66%) amount of necrotic tissue within the wound bed including Eschar and Adherent  Slough. 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 There was a Excisional Skin/Subcutaneous Tissue Debridement with a total area of 9 sq cm performed by Tommie Sams., PA-C. With the following instrument(s): Forceps, and Scissors to remove Viable and Non-Viable tissue/material. Material removed includes Eschar and Subcutaneous Tissue and after achieving pain control using Lidocaine 4% Topical Solution. No specimens were taken. A time out was conducted at 13:52, prior to the start of the procedure. A Minimum amount of bleeding was controlled with Pressure. The procedure was tolerated well with a pain level of 0 throughout and a pain level of 0 following the procedure. Post Debridement Measurements: 20cm length x 14cm width x 0.4cm depth; 87.965cm^3 volume. Character of Wound/Ulcer Post Debridement is improved. Post procedure Diagnosis Wound #1: Same as Pre-Procedure Plan Follow-up Appointments: Return Appointment in 1 week. Edema Control - Lymphedema / Segmental Compressive Device / Other: Elevate, Exercise Daily and Avoid Standing for Long Periods of Time. Elevate legs to the level of the heart and pump ankles as often as possible Elevate leg(s) parallel to the floor when sitting. WOUND #1: - Lower Leg Wound Laterality: Left, Posterior Cleanser: Byram Ancillary Kit - 15 Day Supply (Generic) 1 x Per Day/30 Days Discharge Instructions: Use supplies as instructed; Kit contains: (15) Saline Bullets; (15) 3x3 Gauze; 15 pr Gloves Primary  Dressing: Kerlix AMD Roll Dressing, 4.5x 4.1 (in/in) (Generic) 1 x Per Day/30 Days Discharge Instructions: moisten with dakins Secondary Dressing: Kerlix 4.5 x 4.1 (in/yd) (Generic) 1 x Per Day/30 Days Discharge Instructions: Apply Kerlix 4.5 x 4.1 (in/yd) as instructed Secured With: 29M Medipore H Soft Cloth Surgical Tape, 2x2 (in/yd) (Generic) 1 x Per Day/30 Days WOUND #2: - Penis Wound Laterality: Cleanser: Byram Ancillary Kit - 15 Day Supply (Generic) 1 x Per Day/30 Days Discharge Instructions: Use supplies as instructed; Kit contains: (15) Saline Bullets; (15) 3x3 Gauze; 15 pr Gloves Primary Dressing: Gauze (Generic) 1 x Per Day/30 Days Discharge Instructions: moistened with santyl Primary Dressing: Santyl Collagenase Ointment, 30 (gm), tube 1 x Per Day/30 Days Levins, Siddhanth [name]. ([id number]) #1 I would recommend currently that we go ahead and continue with the wound care measures as before with regard to the Dakin's moistened gauze dressing for the leg I think this is doing quite well. 2. We will continue with the Santyl for the glans of the penis. 3. I am also can recommend that we have the patient continue to monitor for any signs of worsening or infection if anything changes he should let me know. We will see patient back for reevaluation in 1 week here in the clinic. If anything worsens or changes patient will contact our office for additional recommendations. Electronic Signature(s) Signed: 07/12/2021 5:46:54 PM By: Worthy Keeler PA-C Entered By: Worthy Keeler on 07/12/2021 17:46:54 Seher, Loyed [name]. ([id number]) -------------------------------------------------------------------------------- SuperBill Details Patient Name: [name]. Date of Service: 07/12/2021 Medical Record Number: [id number] Patient Account Number: [id number] Date of Birth/Sex: Jun 30, 1941 (80 y.o. 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 Z99.2 Dependence on renal dialysis N18.4 Chronic kidney disease, stage 4 (severe) I25.10 Atherosclerotic heart  disease of native coronary artery without angina pectoris Facility Procedures CPT4 Code: 58446520 Description: 76191 - DEB SUBQ TISSUE 20 SQ CM/< Modifier: Quantity: 1 CPT4 Code: Description: ICD-10 Diagnosis Description L97.822 Non-pressure chronic ulcer of other part of left lower leg with fat layer Modifier: exposed Quantity: Physician Procedures CPT4 Code: 5502714 Description: 23200 - WC PHYS SUBQ TISS 20 SQ CM Modifier: Quantity: 1 CPT4 Code: Description: ICD-10 Diagnosis Description L97.822 Non-pressure chronic ulcer of other part of left lower leg with fat layer Modifier: exposed Quantity: Electronic Signature(s) Signed: 07/12/2021 5:49:37 PM By: Worthy Keeler PA-C Previous Signature: 07/12/2021 3:44:27 PM Version By: Carlene Coria RN Entered By: Worthy Keeler on 07/12/2021 17:49:37

## 2021-07-12 NOTE — Progress Notes (Addendum)
[name] ([id number]) Visit Report for 07/12/2021 Arrival Information Details Patient Name: [name], [name]. Date of Service: 07/12/2021 1:15 PM Medical Record Number: [id number] Patient Account Number: [id number] Date of Birth/Sex: 1941/04/05 (80 y.o. M) Treating RN: Carlene Coria Primary Care Provider: Jenna Luo Other Clinician: Referring Provider: Jenna Luo Treating Provider/Extender: [name] in Treatment: 7 Visit Information History Since Last Visit All ordered tests and consults were completed: No Patient Arrived: Wheel Chair Added or deleted any medications: No Arrival Time: 13:17 Any new allergies or adverse reactions: No Accompanied By: wife Had a fall or experienced change in No Transfer Assistance: None activities of daily living that may affect Patient Identification Verified: Yes risk of falls: Secondary Verification Process Completed: Yes Signs or symptoms of abuse/neglect since last visito No Patient Requires Transmission-Based Precautions: No Hospitalized since last visit: No Patient Has Alerts: No Implantable device outside of the clinic excluding No cellular tissue based products placed in the center since last visit: Has Dressing in Place as Prescribed: Yes Pain Present Now: No Electronic Signature(s) Signed: 07/12/2021 1:47:42 PM By: Carlene Coria RN Entered By: Carlene Coria on 07/12/2021 13:22:55 Joyner, Delonta L. ([id number]) -------------------------------------------------------------------------------- Clinic Level of Care Assessment Details Patient Name: Mark Morale L. Date of Service: 07/12/2021 1:15 PM Medical Record Number: [id number] Patient Account Number: [id number] Date of Birth/Sex: 1940-12-08 (80 y.o. M) Treating RN: Carlene Coria Primary Care Provider: Jenna Luo Other Clinician: Referring Provider: Jenna Luo Treating Provider/Extender: [name] in Treatment: 7 Clinic Level of Care  Assessment Items TOOL 1 Quantity Score [] - Use when EandM and Procedure is performed on INITIAL visit 0 ASSESSMENTS - Nursing Assessment / Reassessment [] - General Physical Exam (combine w/ comprehensive assessment (listed just below) when performed on new 0 pt. evals) [] - 0 Comprehensive Assessment (HX, ROS, Risk Assessments, Wounds Hx, etc.) ASSESSMENTS - Wound and Skin Assessment / Reassessment [] - Dermatologic / Skin Assessment (not related to wound area) 0 ASSESSMENTS - Ostomy and/or Continence Assessment and Care [] - Incontinence Assessment and Management 0 [] - 0 Ostomy Care Assessment and Management (repouching, etc.) PROCESS - Coordination of Care [] - Simple Patient / Family Education for ongoing care 0 [] - 0 Complex (extensive) Patient / Family Education for ongoing care [] - 0 Staff obtains Programmer, systems, Records, Test Results / Process Orders [] - 0 Staff telephones HHA, Nursing Homes / Clarify orders / etc [] - 0 Routine Transfer to another Facility (non-emergent condition) [] - 0 Routine Hospital Admission (non-emergent condition) [] - 0 New Admissions / Biomedical engineer / Ordering NPWT, Apligraf, etc. [] - 0 Emergency Hospital Admission (emergent condition) PROCESS - Special Needs [] - Pediatric / Minor Patient Management 0 [] - 0 Isolation Patient Management [] - 0 Hearing / Language / Visual special needs [] - 0 Assessment of Community assistance (transportation, D/C planning, etc.) [] - 0 Additional assistance / Altered mentation [] - 0 Support Surface(s) Assessment (bed, cushion, seat, etc.) INTERVENTIONS - Miscellaneous [] - External ear exam 0 [] - 0 Patient Transfer (multiple staff / Civil Service fast streamer / Similar devices) [] - 0 Simple Staple / Suture removal (25 or less) [] - 0 Complex Staple / Suture removal (26 or more) [] - 0 Hypo/Hyperglycemic Management (do not check if billed separately) [] - 0 Ankle / Brachial Index (ABI) - do not  check if billed separately Has the patient been seen at the hospital within the last three years: Yes Total Score: 0  Level Of Care: ____ [name] ([id number]) Electronic Signature(s) Signed: 07/12/2021 4:34:48 PM By: Carlene Coria RN Entered By: Carlene Coria on 07/12/2021 13:54:18 [name], [name] [name] ([id number]) -------------------------------------------------------------------------------- Encounter Discharge Information Details Patient Name: [name], [name] Date of Service: 07/12/2021 1:15 PM Medical Record Number: [id number] Patient Account Number: [id number] Date of Birth/Sex: [birth year] (80 y.o. M) Treating RN: Carlene Coria Primary Care Other Atienza: Jenna Luo Other Clinician: Referring [name]: Jenna Luo Treating [name]/Extender: [name] in Treatment: 7 Encounter Discharge Information Items Post Procedure Vitals Discharge Condition: Stable Temperature (F): 99.1 Ambulatory Status: Ambulatory Pulse (bpm): 82 Discharge Destination: Home Respiratory Rate (breaths/min): 18 Transportation: Private Auto Blood Pressure (mmHg): 133/73 Accompanied By: self Schedule Follow-up Appointment: Yes Clinical Summary of Care: Patient Declined Electronic Signature(s) Signed: 07/12/2021 4:34:48 PM By: Carlene Coria RN Entered By: Carlene Coria on 07/12/2021 13:55:38 [name], [name] ([id number]) -------------------------------------------------------------------------------- Lower Extremity Assessment Details Patient Name: [name], [name] Date of Service: 07/12/2021 1:15 PM Medical Record Number: [id number] Patient Account Number: [id number] Date of Birth/Sex: 1940/08/13 (80 y.o. M) Treating RN: Carlene Coria Primary Care [name]: Jenna Luo Other Clinician: Referring [name]: Jenna Luo Treating [name]/Extender: [name] in Treatment: 7 Edema Assessment Assessed: [Left: No] [Right: No] Edema: [Left: Ye] [Right: s] Calf Left:  Right: Point of Measurement: 34 cm From Medial Instep 35 cm Ankle Left: Right: Point of Measurement: 9 cm From Medial Instep 20 cm Vascular Assessment Pulses: Dorsalis Pedis Palpable: [Left:Yes] Electronic Signature(s) Signed: 07/12/2021 1:47:42 PM By: Carlene Coria RN Entered By: Carlene Coria on 07/12/2021 13:31:35 [name], [name] ([id number]) -------------------------------------------------------------------------------- Multi Wound Chart Details Patient Name: Mark Morale L. Date of Service: 07/12/2021 1:15 PM Medical Record Number: [id number] Patient Account Number: [id number] Date of Birth/Sex: September 30, 1940 (80 y.o. M) Treating RN: Carlene Coria Primary Care Tammee Thielke: Jenna Luo Other Clinician: Referring Reygan Heagle: Jenna Luo Treating Lonnie Reth/Extender: [name] in Treatment: 7 Vital Signs Height(in): 68 Pulse(bpm): 80 Weight(lbs): 172 Blood Pressure(mmHg): 160/77 Body Mass Index(BMI): 26 Temperature(F): 99.1 Respiratory Rate(breaths/min): 18 Photos: [N/A:N/A] Wound Location: Left, Posterior Lower Leg Penis N/A Wounding Event: Gradually Appeared Gradually Appeared N/A Primary Etiology: Calciphylaxis Calciphylaxis N/A Comorbid History: Coronary Artery Disease, Type II Coronary Artery Disease, Type II N/A Diabetes Diabetes Date Acquired: 04/05/2021 05/05/2021 N/A Weeks of Treatment: 7 5 N/A Wound Status: Open Open N/A Clustered Wound: Yes No N/A Measurements L x W x D (cm) 20x14x0.4 1x1x0.1 N/A Area (cm) : 219.911 0.785 N/A Volume (cm) : 87.965 0.079 N/A % Reduction in Area: -27.30% 83.30% N/A % Reduction in Volume: -27.30% 83.20% N/A Classification: Full Thickness Without Exposed Full Thickness Without Exposed N/A Support Structures Support Structures Exudate Amount: Medium Medium N/A Exudate Type: Serosanguineous Serosanguineous N/A Exudate Color: red, brown red, brown N/A Granulation Amount: Small (1-33%) Medium (34-66%) N/A Granulation  Quality: Red, Pink N/A N/A Necrotic Amount: Large (67-100%) Medium (34-66%) N/A Necrotic Tissue: Eschar, Adherent [place] N/A Exposed Structures: Fat Layer (Subcutaneous Tissue): Fat Layer (Subcutaneous Tissue): N/A Yes Yes Fascia: No Fascia: No Tendon: No Tendon: No Muscle: No Muscle: No Joint: No Joint: No Bone: No Bone: No Epithelialization: None None N/A Treatment Notes Electronic Signature(s) Signed: 07/12/2021 4:34:48 PM By: Carlene Coria RN Entered By: Carlene Coria on 07/12/2021 13:52:24 [name], [name] ([id number]) -------------------------------------------------------------------------------- [place] Details Patient Name: Mark Morale L. Date of Service: 07/12/2021 1:15 PM Medical Record Number: [id number] Patient Account Number: [id number] Date of Birth/Sex: 08/07/1940 (80 y.o. M) Treating RN: Carlene Coria Primary Care Adeleigh Barletta: Jenna Luo  Other Clinician: Referring Deziya Amero: Jenna Luo Treating Cecil Bixby/Extender: [name] in Treatment: 7 Active Inactive Abuse / Safety / Falls / Self Care Management Nursing Diagnoses: Potential for injury related to falls Goals: Patient will remain injury free related to falls Date Initiated: 05/24/2021 Target Resolution Date: 06/24/2021 Goal Status: Active Interventions: Assess Activities of Daily Living upon admission and as needed Assess fall risk on admission and as needed Assess: immobility, friction, shearing, incontinence upon admission and as needed Assess impairment of mobility on admission and as needed per policy Assess personal safety and home safety (as indicated) on admission and as needed Assess self care needs on admission and as needed Notes: Nutrition Nursing Diagnoses: Impaired glucose control: actual or potential Goals: Patient/caregiver agrees to and verbalizes understanding of need to use nutritional supplements and/or vitamins as  prescribed Date Initiated: 05/24/2021 Target Resolution Date: 06/25/2021 Goal Status: Active Interventions: Assess HgA1c results as ordered upon admission and as needed Assess patient nutrition upon admission and as needed per policy Notes: Wound/Skin Impairment Nursing Diagnoses: Knowledge deficit related to ulceration/compromised skin integrity Goals: Patient/caregiver will verbalize understanding of skin care regimen Date Initiated: 05/24/2021 Target Resolution Date: 06/25/2021 Goal Status: Active Ulcer/skin breakdown will have a volume reduction of 30% by week 4 Date Initiated: 05/24/2021 Target Resolution Date: 07/24/2021 Goal Status: Active Ulcer/skin breakdown will have a volume reduction of 50% by week 8 Date Initiated: 05/24/2021 Target Resolution Date: 08/24/2021 Goal Status: Active Ulcer/skin breakdown will have a volume reduction of 80% by week 12 Date Initiated: 05/24/2021 Target Resolution Date: 09/24/2021 [name], [name] ([id number]) Goal Status: Active Ulcer/skin breakdown will heal within 14 weeks Date Initiated: 05/24/2021 Target Resolution Date: 10/22/2021 Goal Status: Active Interventions: Assess patient/caregiver ability to obtain necessary supplies Assess patient/caregiver ability to perform ulcer/skin care regimen upon admission and as needed Assess ulceration(s) every visit Notes: Electronic Signature(s) Signed: 07/12/2021 4:34:48 PM By: Carlene Coria RN Entered By: Carlene Coria on 07/12/2021 13:52:10 [name], [name] ([id number]) -------------------------------------------------------------------------------- Pain Assessment Details Patient Name: Mark Morale L. Date of Service: 07/12/2021 1:15 PM Medical Record Number: [id number] Patient Account Number: [id number] Date of Birth/Sex: 1941-02-24 (80 y.o. M) Treating RN: Carlene Coria Primary Care Elesia Pemberton: Jenna Luo Other Clinician: Referring Lorice Lafave: Jenna Luo Treating  Jerelyn Trimarco/Extender: [name] in Treatment: 7 Active Problems Location of Pain Severity and Description of Pain Patient Has Paino No Site Locations Pain Management and Medication Current Pain Management: Electronic Signature(s) Signed: 07/12/2021 1:47:42 PM By: Carlene Coria RN Entered By: Carlene Coria on 07/12/2021 13:23:24 [name], [name] ([id number]) -------------------------------------------------------------------------------- Patient/Caregiver Education Details Patient Name: Mark Morale L. Date of Service: 07/12/2021 1:15 PM Medical Record Number: [id number] Patient Account Number: [id number] Date of Birth/Gender: 1941/01/31 (80 y.o. M) Treating RN: Carlene Coria Primary Care Physician: Jenna Luo Other Clinician: Referring Physician: Jenna Luo Treating Physician/Extender: [name] in Treatment: 7 Education Assessment Education Provided To: Patient Education Topics Provided Wound/Skin Impairment: Methods: Explain/Verbal Responses: State content correctly Electronic Signature(s) Signed: 07/12/2021 4:34:48 PM By: Carlene Coria RN Entered By: Carlene Coria on 07/12/2021 13:54:34 Ovitt, Aahan L. ([id number]) -------------------------------------------------------------------------------- Wound Assessment Details Patient Name: [name], [name] Date of Service: 07/12/2021 1:15 PM Medical Record Number: [id number] Patient Account Number: [id number] Date of Birth/Sex: 1941/06/18 (80 y.o. M) Treating RN: Carlene Coria Primary Care Crucita Lacorte: Jenna Luo Other Clinician: Referring Fayne Mcguffee: Jenna Luo Treating Drena Ham/Extender: [name] in Treatment: 7 Wound Status Wound Number: 1 Primary Etiology: Calciphylaxis Wound Location: Left, Posterior Lower Leg Wound Status: Open Wounding Event: Gradually  Appeared Comorbid History: Coronary Artery Disease, Type II Diabetes Date Acquired: 04/05/2021 Weeks Of Treatment: 7 Clustered  Wound: Yes Photos Wound Measurements Length: (cm) 20 Width: (cm) 14 Depth: (cm) 0.4 Area: (cm) 219.911 Volume: (cm) 87.965 % Reduction in Area: -27.3% % Reduction in Volume: -27.3% Epithelialization: None Tunneling: No Undermining: No Wound Description Classification: Full Thickness Without Exposed Support Structures Exudate Amount: Medium Exudate Type: Serosanguineous Exudate Color: red, brown Foul Odor After Cleansing: No Slough/Fibrino Yes Wound Bed Granulation Amount: Small (1-33%) Exposed Structure Granulation Quality: Red, Pink Fascia Exposed: No Necrotic Amount: Large (67-100%) Fat Layer (Subcutaneous Tissue) Exposed: Yes Necrotic Quality: Eschar, Adherent Slough Tendon Exposed: No Muscle Exposed: No Joint Exposed: No Bone Exposed: No Treatment Notes Wound #1 (Lower Leg) Wound Laterality: Left, Posterior Cleanser Byram Ancillary Kit - 15 Day Supply Discharge Instruction: Use supplies as instructed; Kit contains: (15) Saline Bullets; (15) 3x3 Gauze; 15 pr Gloves Peri-Wound Care L[place], [place] ([id number]) Topical Primary Dressing Kerlix AMD Roll Dressing, 4.5x 4.1 (in/in) Discharge Instruction: moisten with dakins Secondary Dressing Kerlix 4.5 x 4.1 (in/yd) Discharge Instruction: Apply Kerlix 4.5 x 4.1 (in/yd) as instructed Secured With 53M Medipore H Soft Cloth Surgical Tape, 2x2 (in/yd) Compression Wrap Compression Stockings Add-Ons Electronic Signature(s) Signed: 07/12/2021 1:47:42 PM By: Carlene Coria RN Entered By: Carlene Coria on 07/12/2021 13:29:56 Fritze, Sheila L. ([id number]) -------------------------------------------------------------------------------- Wound Assessment Details Patient Name: [name], [name] Date of Service: 07/12/2021 1:15 PM Medical Record Number: [id number] Patient Account Number: [id number] Date of Birth/Sex: [birth year] (80 y.o. M) Treating RN: Carlene Coria Primary Care Geneviene Tesch: Jenna Luo Other  Clinician: Referring Jovon Winterhalter: Jenna Luo Treating Nayelie Gionfriddo/Extender: [name] in Treatment: 7 Wound Status Wound Number: 2 Primary Etiology: Calciphylaxis Wound Location: Penis Wound Status: Open Wounding Event: Gradually Appeared Comorbid History: Coronary Artery Disease, Type II Diabetes Date Acquired: 05/05/2021 Weeks Of Treatment: 5 Clustered Wound: No Photos Wound Measurements Length: (cm) 1 Width: (cm) 1 Depth: (cm) 0.1 Area: (cm) 0.785 Volume: (cm) 0.079 % Reduction in Area: 83.3% % Reduction in Volume: 83.2% Epithelialization: None Tunneling: No Undermining: No Wound Description Classification: Full Thickness Without Exposed Support Structures Exudate Amount: Medium Exudate Type: Serosanguineous Exudate Color: red, brown Foul Odor After Cleansing: No Slough/Fibrino Yes Wound Bed Granulation Amount: Medium (34-66%) Exposed Structure Necrotic Amount: Medium (34-66%) Fascia Exposed: No Necrotic Quality: Eschar, Adherent [place] Fat Layer (Subcutaneous Tissue) Exposed: Yes Tendon Exposed: No Muscle Exposed: No Joint Exposed: No Bone Exposed: No Treatment Notes Wound #2 (Penis) Cleanser Byram Ancillary Kit - 15 Day Supply Discharge Instruction: Use supplies as instructed; Kit contains: (15) Saline Bullets; (15) 3x3 Gauze; 15 pr Gloves Peri-Wound Care Sprague, [place] ([id number]) Topical Primary Dressing Gauze Discharge Instruction: moistened with santyl Santyl Collagenase Ointment, 30 (gm), tube Secondary Dressing Secured With Compression Wrap Compression Stockings Add-Ons Electronic Signature(s) Signed: 07/12/2021 1:47:42 PM By: Carlene Coria RN Entered By: Carlene Coria on 07/12/2021 13:30:29 Wojtaszek, Geroge L. ([id number]) -------------------------------------------------------------------------------- Vitals Details Patient Name: Mark Morale L. Date of Service: 07/12/2021 1:15 PM Medical Record Number: [id number] Patient  Account Number: [id number] Date of Birth/Sex: 1940/10/18 (80 y.o. M) Treating RN: Carlene Coria Primary Care Lutisha Knoche: Jenna Luo Other Clinician: Referring Jasaiah Karwowski: Jenna Luo Treating Zarai Orsborn/Extender: [name] in Treatment: 7 Vital Signs Time Taken: 13:22 Temperature (F): 99.1 Height (in): 68 Pulse (bpm): 83 Weight (lbs): 172 Respiratory Rate (breaths/min): 18 Body Mass Index (BMI): 26.1 Blood Pressure (mmHg): 160/77 Reference Range: 80 - 120 mg / dl Electronic Signature(s) Signed: 07/12/2021 1:47:42 PM By: Dolores Lory,  Carrie RN Entered By: Carlene Coria on 07/12/2021 13:23:16

## 2021-07-15 ENCOUNTER — Encounter: Payer: Self-pay | Admitting: Family Medicine

## 2021-07-16 DIAGNOSIS — D509 Iron deficiency anemia, unspecified: Secondary | ICD-10-CM | POA: Diagnosis not present

## 2021-07-16 DIAGNOSIS — D631 Anemia in chronic kidney disease: Secondary | ICD-10-CM | POA: Diagnosis not present

## 2021-07-16 DIAGNOSIS — Z992 Dependence on renal dialysis: Secondary | ICD-10-CM | POA: Diagnosis not present

## 2021-07-16 DIAGNOSIS — N2581 Secondary hyperparathyroidism of renal origin: Secondary | ICD-10-CM | POA: Diagnosis not present

## 2021-07-16 DIAGNOSIS — D689 Coagulation defect, unspecified: Secondary | ICD-10-CM | POA: Diagnosis not present

## 2021-07-16 DIAGNOSIS — N186 End stage renal disease: Secondary | ICD-10-CM | POA: Diagnosis not present

## 2021-07-16 NOTE — Telephone Encounter (Signed)
LOV 05/08/21 Last refill 06/27/21, #120, 0 refills  Please review pt's request to increase strength, thanks!

## 2021-07-17 ENCOUNTER — Encounter: Payer: Self-pay | Admitting: Family Medicine

## 2021-07-17 ENCOUNTER — Other Ambulatory Visit: Payer: Self-pay | Admitting: Family Medicine

## 2021-07-17 DIAGNOSIS — Z6826 Body mass index (BMI) 26.0-26.9, adult: Secondary | ICD-10-CM | POA: Diagnosis not present

## 2021-07-17 DIAGNOSIS — R630 Anorexia: Secondary | ICD-10-CM | POA: Diagnosis not present

## 2021-07-17 DIAGNOSIS — Z515 Encounter for palliative care: Secondary | ICD-10-CM | POA: Diagnosis not present

## 2021-07-17 DIAGNOSIS — Z992 Dependence on renal dialysis: Secondary | ICD-10-CM | POA: Diagnosis not present

## 2021-07-17 DIAGNOSIS — N186 End stage renal disease: Secondary | ICD-10-CM | POA: Diagnosis not present

## 2021-07-17 DIAGNOSIS — S81802D Unspecified open wound, left lower leg, subsequent encounter: Secondary | ICD-10-CM | POA: Diagnosis not present

## 2021-07-17 DIAGNOSIS — E440 Moderate protein-calorie malnutrition: Secondary | ICD-10-CM | POA: Diagnosis not present

## 2021-07-17 MED ORDER — HYDROCODONE-ACETAMINOPHEN 7.5-300 MG PO TABS: 1.0000 | ORAL_TABLET | Freq: Four times a day (QID) | ORAL | 0 refills | Status: DC

## 2021-07-18 ENCOUNTER — Encounter: Payer: Self-pay | Admitting: Family Medicine

## 2021-07-18 NOTE — Telephone Encounter (Addendum)
[facility] does not carry 7.5/300, I have confirmed this with the pharmacy - please change rx to 7.5/325.  Spoke with pt and explained a new rx cannot be sent in until tomorrow when Dr. Dennard Schaumann returns. Pt is very upset. He states he ran out of the 5/325 #120 this past Monday because he has been taking 2 tabs q6h around the clock - of note pt picked up this rx 07/02/21, it lasted him about 2 weeks.  Pt states the pain in his leg is excruciating, radiates up into his groin. He states wound care is not addressing his pain level. He is agreeable to pain management, referral has been placed. Advised pt if his pain is intolerable he would need to go to ED for assessment and possible pain medication. Pt voiced understanding.   Thank you!

## 2021-07-19 ENCOUNTER — Other Ambulatory Visit: Payer: Self-pay | Admitting: Family Medicine

## 2021-07-19 ENCOUNTER — Other Ambulatory Visit: Payer: Self-pay

## 2021-07-19 ENCOUNTER — Encounter: Payer: PPO | Admitting: Physician Assistant

## 2021-07-19 DIAGNOSIS — Z992 Dependence on renal dialysis: Secondary | ICD-10-CM | POA: Diagnosis not present

## 2021-07-19 DIAGNOSIS — N184 Chronic kidney disease, stage 4 (severe): Secondary | ICD-10-CM | POA: Diagnosis not present

## 2021-07-19 DIAGNOSIS — L97222 Non-pressure chronic ulcer of left calf with fat layer exposed: Secondary | ICD-10-CM | POA: Diagnosis not present

## 2021-07-19 DIAGNOSIS — L97822 Non-pressure chronic ulcer of other part of left lower leg with fat layer exposed: Secondary | ICD-10-CM | POA: Diagnosis not present

## 2021-07-19 MED ORDER — HYDROCODONE-ACETAMINOPHEN 7.5-325 MG PO TABS: 1.0000 | ORAL_TABLET | Freq: Four times a day (QID) | ORAL | 0 refills | Status: DC | PRN

## 2021-07-19 NOTE — Progress Notes (Addendum)
POLO, MCMARTIN ([id number]) Visit Report for 07/19/2021 Chief Complaint Document Details Patient Name: [name], [name]. Date of Service: 07/19/2021 1:15 PM Medical Record Number: [id number] Patient Account Number: [id number] Date of Birth/Sex: Jan 25, 1941 (80 y.o. 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 ([id number]) -------------------------------------------------------------------------------- Debridement Details Patient Name: Mark Morale L. Date of Service: 07/19/2021 1:15 PM Medical Record Number: [id number] Patient Account Number: [id number] Date of Birth/Sex: [birth year] (80 y.o. M) Treating RN: Carlene Coria Primary Care Provider: Jenna Luo Other Clinician: Referring Provider: Jenna Luo Treating Provider/Extender: Skipper Cliche in Treatment: 8 Debridement Performed for Wound #1 Left,Posterior Lower Leg Assessment: Performed By: Physician Tommie Sams., PA-C Debridement Type: Debridement Level of Consciousness (Pre- Awake and Alert procedure): Pre-procedure Verification/Time Out Yes - 13:25 Taken: Start Time: 13:25 Pain Control: Lidocaine 4% Topical Solution Total Area Debrided (L x W): 3 (cm) x 3 (cm) = 9 (cm) Tissue and other material Viable, Non-Viable, Eschar, Slough, Slough debrided: Level: Non-Viable Tissue Debridement Description: Selective/Open Wound Instrument: Forceps, Scissors Bleeding: Moderate Hemostasis Achieved: Pressure End Time: 13:28 Procedural Pain: 0 Post Procedural Pain: 0 Response to Treatment: Procedure was tolerated  well Level of Consciousness (Post- Awake and Alert procedure): Post Debridement Measurements of Total Wound Length: (cm) 20 Width: (cm) 14 Depth: (cm) 0.4 Volume: (cm) 87.965 Character of Wound/Ulcer Post Debridement: Improved Post Procedure Diagnosis Same as Pre-procedure Electronic Signature(s) Signed: 07/19/2021 7:26:01 PM By: Worthy Keeler PA-C Signed: 07/20/2021 4:49:51 PM By: Carlene Coria RN Entered By: Carlene Coria on 07/19/2021 13:26:24 Henry, Sigismund L. ([id number]) -------------------------------------------------------------------------------- HPI Details Patient Name: Quist, [name] Date of Service: 07/19/2021 1:15 PM Medical Record Number: [id number] Patient Account Number: [id number] Date of Birth/Sex: 1941-04-09 (80 y.o. 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 This has been present for a couple of months currently he is on the sodium thiosulfate at dialysis. He tells me that they picked up on this quickly and have been managing it but he really has not had any help with anything else from the standpoint of progressing the wound itself. The patient does have again end-stage renal disease which currently is listed as stage IV but he is on renal dialysis. He also has coronary artery disease as well as going shortly for formal arterial studies although he did have a quick test of the tibial artery with his vascular doctor when he went in to check on his fistula for his arm and subsequently they did not feel like he had any hemodynamically unstable flow into the lower extremity this is good news.  Nonetheless I think that the biggest issue here is going to be getting some of the eschar off so that we get the wounds med headed in the appropriate direction. 05/31/2021 upon evaluation today patient appears to be doing well with regard to his leg ulceration with calciphylaxis. I am very pleased in this regard. Fortunately there does not appear  to be any signs of active infection at this time which is great news. No fevers, chills, nausea, vomiting, or diarrhea. 06/07/2021 upon evaluation today patient appears to be doing well with regard to his legs. Unfortunately he is having an issue here with his penis as well he did mention at the end of last visit. I suggested that he go see urology. However when he went to see the urologist they basically told him there was not anything they could do. They recommended that we needed to be the ones to take care of this. With that being said this is an unusual area that to be honest a lot of the traditional wound care products we use are not really good to be good for this region. Potentially Medihoney alginate could be a possibility also think Santyl could be a possibility. With that being said I think initially my suggestion is probably can be for [place] to attempt the Santyl to see if that will benefit the patient. 06/21/2021 upon evaluation today patient's wound bed actually showed signs of good granulation and epithelization at this point. Fortunately I do not see any signs of active infection systemically which is great news and his legs are doing awesome I think you are making great progress. The Santyl on the glans penis also has been of benefit this is looking tremendously better. Again the patient is pleased he also tells me the pain is significantly improved. Overall I think we are headed in the appropriate direction here. 07/05/2021 upon evaluation today patient appears to be doing well with regard to his wounds. Both the area on the tip of his penis as  well as the left lateral leg is doing well. There is some debridement this can be needed over the leg region. Fortunately I think that overall the Dakin's moistened gauze has done a great job here. No fevers, chills, nausea, vomiting, or diarrhea. 07/12/2021 upon evaluation today patient presents for reevaluation here in the clinic and overall I think that his wounds are doing significantly better which is great news. This includes both the left leg as well as the glans of the penis. Both are showing signs of significant improvement which is great and overall I think that we are headed in the right direction. I do not see any evidence of active infection at this moment. 07/19/2021 upon evaluation today patient appears to be doing better in regard to his wounds. Were getting much closer to complete resolution in regard to the necrotic tissue being removed from the leg. Overall I am extremely pleased with where we stand today. Overall I do not see any signs of active infection at this time. With regard to the patient's penis this area is showing signs of improvement as well and I am very pleased with that. Electronic Signature(s) Signed: 07/19/2021 1:33:39 PM By: Worthy Keeler PA-C Entered By: Worthy Keeler on 07/19/2021 13:33:39 [place], [name] ([id number]) -------------------------------------------------------------------------------- Physical Exam Details Patient Name: Raymundo, Burris L. Date of Service: 07/19/2021 1:15 PM Medical Record Number: [id number] Patient Account Number: [id number] Date of Birth/Sex: 1941/06/16 (80 y.o. M) Treating RN: Carlene Coria Primary Care Provider: Jenna Luo Other Clinician: Referring Provider: Jenna Luo Treating Provider/Extender: Skipper Cliche in Treatment: 8 Constitutional Well-nourished and well-hydrated in no acute distress. Respiratory normal breathing without difficulty. Psychiatric this patient is able to make decisions and  demonstrates good insight into disease process. Alert and Oriented x 3. pleasant and cooperative. Notes Patient's wound bed showed signs again  of good granulation epithelization pretty much across the board I am actually extremely pleased with where we stand and I think we are making some great progress here. I do not see any signs of active infection at this time. No fevers, chills, nausea, vomiting, or diarrhea. Electronic Signature(s) Signed: 07/19/2021 1:33:58 PM By: Worthy Keeler PA-C Entered By: Worthy Keeler on 07/19/2021 13:33:58 Staubs, [name] ([id number]) -------------------------------------------------------------------------------- Physician Orders Details Patient Name: Mark Morale L. Date of Service: 07/19/2021 1:15 PM Medical Record Number: [id number] Patient Account Number: [id number] Date of Birth/Sex: 01/07/1941 (80 y.o. 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 Edema Control - Lymphedema / Segmental Compressive Device / Other o Elevate, Exercise Daily and Avoid Standing for Long Periods of Time. o Elevate legs to the level of the heart and pump ankles as often as possible o Elevate leg(s) parallel to the floor when sitting. Wound Treatment Wound #1 - Lower Leg Wound Laterality: Left,  Posterior Cleanser: Byram Ancillary Kit - 15 Day Supply (Generic) 1 x Per Day/30 Days Discharge Instructions: Use supplies as instructed; Kit contains: (15) Saline Bullets; (15) 3x3 Gauze; 15 pr Gloves Primary Dressing: Kerlix AMD Roll Dressing, 4.5x 4.1 (in/in) (Generic) 1 x Per Day/30 Days Discharge Instructions: moisten with dakins Secondary Dressing: Kerlix 4.5 x 4.1 (in/yd) (Generic) 1 x Per Day/30 Days Discharge Instructions: Apply Kerlix 4.5 x 4.1 (in/yd) as instructed Secured With: 24M Medipore H Soft Cloth Surgical Tape, 2x2 (in/yd) (Generic) 1 x Per Day/30 Days Wound #2 - Penis Cleanser: Byram Ancillary Kit - 15 Day Supply (Generic) 1 x Per Day/30 Days Discharge Instructions: Use supplies as instructed; Kit contains: (15) Saline Bullets; (15) 3x3 Gauze; 15 pr Gloves Primary Dressing: Gauze (Generic) 1 x Per Day/30 Days Discharge Instructions: moistened with santyl Primary Dressing: Santyl Collagenase Ointment, 30 (gm), tube 1 x Per Day/30 Days Electronic Signature(s) Signed: 07/19/2021 7:26:01 PM By: Worthy Keeler PA-C Signed: 07/20/2021 4:49:51 PM By: Carlene Coria RN Entered By: Carlene Coria on 07/19/2021 13:26:46 Lensing, Ranulfo L. ([id number]) -------------------------------------------------------------------------------- Problem List Details Patient Name: Vanwart, Raun L. Date of Service: 07/19/2021 1:15 PM Medical Record Number: [id number] Patient Account Number: [id number] Date of Birth/Sex: 1940-08-16 (80 y.o. 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 05/24/2021 No Yes L97.822 Non-pressure chronic ulcer of other part of left lower leg with fat layer 05/24/2021 No Yes exposed L98.492 Non-pressure  chronic ulcer of skin of other sites with fat layer exposed 06/07/2021 No Yes Z99.2 Dependence on renal dialysis 05/24/2021 No Yes N18.4 Chronic kidney disease, stage 4 (severe) 05/24/2021 No Yes I25.10 Atherosclerotic heart disease of native coronary artery without angina 05/24/2021 No Yes pectoris Inactive Problems Resolved Problems Electronic Signature(s) Signed: 07/19/2021 1:19:08 PM By: Worthy Keeler PA-C Entered By: Worthy Keeler  on 07/19/2021 13:19:08 Culmer, Kendale L. ([id number]) -------------------------------------------------------------------------------- Progress Note Details Patient Name: Parkison, Tarun L. Date of Service: 07/19/2021 1:15 PM Medical Record Number: [id number] Patient Account Number: [id number] Date of Birth/Sex: [birth year] (80 y.o. 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 This has been present for a couple of months currently he is on the sodium thiosulfate at dialysis. He tells me that they picked up on this quickly and have been managing it but he really has not had any help with anything else from the standpoint of progressing the wound itself. The patient does have again end-stage renal disease which currently is listed as stage IV but he is on renal dialysis. He also has coronary artery disease as well as going shortly for formal arterial studies although he did have a quick test of the tibial artery with his vascular doctor when he went in to check on his fistula for his arm and  subsequently they did not feel like he had any hemodynamically unstable flow into the lower extremity this is good news. Nonetheless I think that the biggest issue here is going to be getting some of the eschar off so that we get the wounds med headed in the appropriate direction. 05/31/2021 upon evaluation today patient appears to be doing well with regard to his leg ulceration with calciphylaxis. I am very pleased in this regard. Fortunately there does not appear to be any signs of active infection at this time which is great news. No fevers, chills, nausea, vomiting, or diarrhea. 06/07/2021 upon evaluation today patient appears to be doing well with regard to his legs. Unfortunately he is having an issue here with his penis as well he did mention at the end of last visit. I suggested that he go see urology. However when he went to see the urologist they basically told him there was not anything they could do. They recommended that we needed to be the ones to take care of this. With that being said this is an unusual area that to be honest a lot of the traditional wound care products we use are not really good to be good for this region. Potentially Medihoney alginate could be a possibility also think Santyl could be a possibility. With that being said I think initially my suggestion is probably can be for [place] to attempt the Santyl to see if that will benefit the patient. 06/21/2021 upon evaluation today patient's wound bed actually showed signs of good granulation and epithelization at this point. Fortunately I do not see any signs of active infection systemically which is great news and his legs are doing awesome I think you are making great progress. The Santyl on the glans penis also has been of benefit this is looking tremendously better. Again the patient is pleased he also tells me the pain is significantly improved. Overall I think we are headed in the appropriate direction here. 07/05/2021  upon evaluation today patient appears to be doing well with regard to his wounds. Both the area on the tip of his penis as well as the left lateral leg is doing well. There is some  debridement this can be needed over the leg region. Fortunately I think that overall the Dakin's moistened gauze has done a great job here. No fevers, chills, nausea, vomiting, or diarrhea. 07/12/2021 upon evaluation today patient presents for reevaluation here in the clinic and overall I think that his wounds are doing significantly better which is great news. This includes both the left leg as well as the glans of the penis. Both are showing signs of significant improvement which is great and overall I think that we are headed in the right direction. I do not see any evidence of active infection at this moment. 07/19/2021 upon evaluation today patient appears to be doing better in regard to his wounds. Were getting much closer to complete resolution in regard to the necrotic tissue being removed from the leg. Overall I am extremely pleased with where we stand today. Overall I do not see any signs of active infection at this time. With regard to the patient's penis this area is showing signs of improvement as well and I am very pleased with that. Objective Constitutional Well-nourished and well-hydrated in no acute distress. Vitals Time Taken: 1:08 PM, Height: 68 in, Weight: 172 lbs, BMI: 26.1, Temperature: 98.3 F, Pulse: 79 bpm, Respiratory Rate: 18 breaths/min, Blood Pressure: 120/78 mmHg. Respiratory normal breathing without difficulty. CLIDE, REMMERS ([id number]) Psychiatric this patient is able to make decisions and demonstrates good insight into disease process. Alert and Oriented x 3. pleasant and cooperative. General Notes: Patient's wound bed showed signs again of good granulation epithelization pretty much across the board I am actually extremely pleased with where we stand and I think we are making  some great progress here. I do not see any signs of active infection at this time. No fevers, chills, nausea, vomiting, or diarrhea. Integumentary (Hair, Skin) Wound #1 status is Open. Original cause of wound was Gradually Appeared. The date acquired was: 04/05/2021. The wound has been in treatment 8 weeks. The wound is located on the Left,Posterior Lower Leg. The wound measures 20cm length x 14cm width x 0.4cm depth; 219.911cm^2 area and 87.965cm^3 volume. There is Fat Layer (Subcutaneous Tissue) exposed. There is no tunneling or undermining noted. There is a medium amount of serosanguineous drainage noted. There is small (1-33%) red, pink granulation within the wound bed. There is a large (67-100%) amount of necrotic tissue within the wound bed including Eschar and Adherent Slough. Wound #2 status is Open. Original cause of wound was Gradually Appeared. The date acquired was: 05/05/2021. The wound has been in treatment 6 weeks. The wound is located on the Penis. The wound measures 1cm length x 1cm width x 0.1cm depth; 0.785cm^2 area and 0.079cm^3 volume. There is Fat Layer (Subcutaneous Tissue) exposed. There is no tunneling or undermining noted. There is a medium amount of serosanguineous drainage noted. There is medium (34-66%) granulation within the wound bed. There is a medium (34-66%) amount of necrotic tissue within the wound bed including Eschar and Adherent Slough. 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 There was a Selective/Open  Wound Non-Viable Tissue Debridement with a total area of 9 sq  cm performed by Tommie Sams., PA-C. With the following instrument(s): Forceps, and Scissors to remove Viable and Non-Viable tissue/material. Material removed includes Eschar and Slough and after achieving pain control using Lidocaine 4% Topical Solution. No specimens were taken. A time out was conducted at 13:25, prior to the start of the procedure. A Moderate amount of bleeding was controlled with Pressure. The procedure was tolerated well with a pain level of 0 throughout and a pain level of 0 following the procedure. Post Debridement Measurements: 20cm length x 14cm width x 0.4cm depth; 87.965cm^3 volume. Character of Wound/Ulcer Post Debridement is improved. Post procedure Diagnosis Wound #1: Same as Pre-Procedure Plan Follow-up Appointments: Return Appointment in 1 week. Edema Control - Lymphedema / Segmental Compressive Device / Other: Elevate, Exercise Daily and Avoid Standing for Long Periods of Time. Elevate legs to the level of the heart and pump ankles as often as possible Elevate leg(s) parallel to the floor when sitting. WOUND #1: - Lower Leg Wound Laterality: Left, Posterior Cleanser: Byram Ancillary Kit - 15 Day Supply (Generic) 1 x Per Day/30 Days Discharge Instructions: Use supplies as instructed; Kit contains: (15) Saline Bullets; (15) 3x3 Gauze; 15 pr Gloves Primary Dressing: Kerlix AMD Roll Dressing, 4.5x 4.1 (in/in) (Generic) 1 x Per Day/30 Days Discharge Instructions: moisten with dakins Secondary Dressing: Kerlix 4.5 x 4.1 (in/yd) (Generic) 1 x Per Day/30 Days Discharge Instructions: Apply Kerlix 4.5 x 4.1 (in/yd) as instructed Secured With: 64M Medipore H Soft Cloth Surgical Tape, 2x2 (in/yd) (Generic) 1 x Per Day/30 Days WOUND #2: - Penis Wound Laterality: Nissan, Jabarie L. ([id number]) Cleanser: Byram Ancillary Kit - 15 Day Supply (Generic) 1 x Per Day/30 Days Discharge Instructions: Use supplies as  instructed; Kit contains: (15) Saline Bullets; (15) 3x3 Gauze; 15 pr Gloves Primary Dressing: Gauze (Generic) 1 x Per Day/30 Days Discharge Instructions: moistened with santyl Primary Dressing: Santyl Collagenase Ointment, 30 (gm), tube 1 x Per Day/30 Days 1. I am good recommend currently that we going continue with the wound care measures as before the patient is in agreement with the plan. This includes the use of the Santyl to the penis region. This seems to be doing well over the glans. 2. I am also can recommend we continue with the Dakin's moistened gauze to the leg which I think is doing awesome job. 3. I would also suggest we continue to have the patient monitor for any signs of worsening or infection though I see no evidence of infection right now. We will see patient back for reevaluation in 1 week here in the clinic. If anything worsens or changes patient will contact our office for additional recommendations. Electronic Signature(s) Signed: 07/19/2021 1:34:38 PM By: Worthy Keeler PA-C Entered By: Worthy Keeler on 07/19/2021 13:34:37 Cost, [name] ([id number]) -------------------------------------------------------------------------------- SuperBill Details Patient Name: Mark Morale L. Date of Service: 07/19/2021 Medical Record Number: [id number] Patient Account Number: [id number] Date of Birth/Sex: May 26, 1941 (80 y.o. 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 Z99.2 Dependence on renal dialysis N18.4 Chronic kidney disease, stage 4 (severe) I25.10  Atherosclerotic heart disease of native coronary artery without angina pectoris Facility Procedures CPT4 Code: [id number] Description: [phone] -  DEBRIDE WOUND 1ST 20 SQ CM OR < Modifier: Quantity: 1 CPT4 Code: Description: ICD-10 Diagnosis Description L97.822 Non-pressure chronic ulcer of other part of left lower leg with fat layer Modifier: exposed Quantity: Physician Procedures CPT4 Code: [id number] Description: 46190 - WC PHYS LEVEL 3 - EST PT Modifier: 25 Quantity: 1 CPT4 Code: Description: ICD-10 Diagnosis Description 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.50 Unspecified disorder of calcium metabolism L97.822 Non-pressure chronic ulcer of other part of left lower leg with fat layer L98.492 Non-pressure chronic ulcer of skin of other sites with fat layer exposed  Z99.2 Dependence on renal dialysis Modifier: exposed Quantity: CPT4 Code: [id number] Description: 46431 - WC PHYS DEBR WO ANESTH 20 SQ CM Modifier: Quantity: 1 CPT4 Code: Description: ICD-10 Diagnosis Description L97.822 Non-pressure chronic ulcer of other part of left lower leg with fat layer Modifier: exposed Quantity: Electronic Signature(s) Signed: 07/19/2021 1:35:20 PM By: Worthy Keeler PA-C Entered By: Worthy Keeler on 07/19/2021 13:35:20

## 2021-07-20 NOTE — Progress Notes (Signed)
LORETO, LOESCHER ([id number]) Visit Report for 07/19/2021 Arrival Information Details Patient Name: WISAM, SIEFRING. Date of Service: 07/19/2021 1:15 PM Medical Record Number: [id number] Patient Account Number: [id number] Date of Birth/Sex: [birth year] (80 y.o. M) Treating RN: Carlene Coria Primary Care Llewyn Heap: Jenna Luo Other Clinician: Referring Angele Wiemann: Jenna Luo Treating Jarion Hawthorne/Extender: Skipper Cliche in Treatment: 8 Visit Information History Since Last Visit All ordered tests and consults were completed: No Patient Arrived: Wheel Chair Added or deleted any medications: No Arrival Time: 13:07 Any new allergies or adverse reactions: No Accompanied By: wife Had a fall or experienced change in No Transfer Assistance: None activities of daily living that may affect Patient Identification Verified: Yes risk of falls: Secondary Verification Process Completed: Yes Signs or symptoms of abuse/neglect since last visito No Patient Requires Transmission-Based Precautions: No Hospitalized since last visit: No Patient Has Alerts: No Implantable device outside of the clinic excluding No cellular tissue based products placed in the center since last visit: Has Dressing in Place as Prescribed: Yes Pain Present Now: No Electronic Signature(s) Signed: 07/20/2021 4:49:51 PM By: Carlene Coria RN Entered By: Carlene Coria on 07/19/2021 13:08:03 Petz, Chrishaun L. ([id number]) -------------------------------------------------------------------------------- Clinic Level of Care Assessment Details Patient Name: [name] Date of Service: 07/19/2021 1:15 PM Medical Record Number: [id number] Patient Account Number: [id number] Date of Birth/Sex: January 18, 1941 (80 y.o. M) Treating RN: Carlene Coria Primary Care Fynn Adel: Jenna Luo Other Clinician: Referring Christeena Krogh: Jenna Luo Treating Ivory Bail/Extender: Skipper Cliche in Treatment: 8 Clinic Level of Care  Assessment Items TOOL 1 Quantity Score []  - Use when EandM and Procedure is performed on INITIAL visit 0 ASSESSMENTS - Nursing Assessment / Reassessment []  - General Physical Exam (combine w/ comprehensive assessment (listed just below) when performed on new 0 pt. evals) []  - 0 Comprehensive Assessment (HX, ROS, Risk Assessments, Wounds Hx, etc.) ASSESSMENTS - Wound and Skin Assessment / Reassessment []  - Dermatologic / Skin Assessment (not related to wound area) 0 ASSESSMENTS - Ostomy and/or Continence Assessment and Care []  - Incontinence Assessment and Management 0 []  - 0 Ostomy Care Assessment and Management (repouching, etc.) PROCESS - Coordination of Care []  - Simple Patient / Family Education for ongoing care 0 []  - 0 Complex (extensive) Patient / Family Education for ongoing care []  - 0 Staff obtains Programmer, systems, Records, Test Results / Process Orders []  - 0 Staff telephones HHA, Nursing Homes / Clarify orders / etc []  - 0 Routine Transfer to another Facility (non-emergent condition) []  - 0 Routine Hospital Admission (non-emergent condition) []  - 0 New Admissions / Biomedical engineer / Ordering NPWT, Apligraf, etc. []  - 0 Emergency Hospital Admission (emergent condition) PROCESS - Special Needs []  - Pediatric / Minor Patient Management 0 []  - 0 Isolation Patient Management []  - 0 Hearing / Language / Visual special needs []  - 0 Assessment of Community assistance (transportation, D/C planning, etc.) []  - 0 Additional assistance / Altered mentation []  - 0 Support Surface(s) Assessment (bed, cushion, seat, etc.) INTERVENTIONS - Miscellaneous []  - External ear exam 0 []  - 0 Patient Transfer (multiple staff / Civil Service fast streamer / Similar devices) []  - 0 Simple Staple / Suture removal (25 or less) []  - 0 Complex Staple / Suture removal (26 or more) []  - 0 Hypo/Hyperglycemic Management (do not check if billed separately) []  - 0 Ankle / Brachial Index (ABI) - do not  check if billed separately Has the patient been seen at the hospital within the last three years: Yes Total Score: 0  Level Of Care: ____ [name] ([id number]) Electronic Signature(s) Signed: 07/20/2021 4:49:51 PM By: Carlene Coria RN Entered By: Carlene Coria on 07/19/2021 13:26:58 Calkin, Tayvon Carlean Jews ([id number]) -------------------------------------------------------------------------------- Encounter Discharge Information Details Patient Name: [name], Antrell L. Date of Service: 07/19/2021 1:15 PM Medical Record Number: [id number] Patient Account Number: [id number] Date of Birth/Sex: 21-Aug-1940 (80 y.o. M) Treating RN: Carlene Coria Primary Care Mikaeel Petrow: Jenna Luo Other Clinician: Referring Joel Cowin: Jenna Luo Treating Anyelina Claycomb/Extender: Skipper Cliche in Treatment: 8 Encounter Discharge Information Items Post Procedure Vitals Discharge Condition: Stable Temperature (F): 98.3 Ambulatory Status: Wheelchair Pulse (bpm): 79 Discharge Destination: Home Respiratory Rate (breaths/min): 18 Transportation: Private Auto Blood Pressure (mmHg): 120/78 Accompanied By: daughter Schedule Follow-up Appointment: Yes Clinical Summary of Care: Electronic Signature(s) Signed: 07/20/2021 4:49:51 PM By: Carlene Coria RN Entered By: Carlene Coria on 07/19/2021 13:27:58 Pask, Nachmen L. ([id number]) -------------------------------------------------------------------------------- Lower Extremity Assessment Details Patient Name: Nouri, Cobey L. Date of Service: 07/19/2021 1:15 PM Medical Record Number: [id number] Patient Account Number: [id number] Date of Birth/Sex: Dec 16, 1940 (80 y.o. M) Treating RN: Carlene Coria Primary Care Amaranta Mehl: Jenna Luo Other Clinician: Referring Matin Mattioli: Jenna Luo Treating Jeremie Abdelaziz/Extender: Skipper Cliche in Treatment: 8 Edema Assessment Assessed: [Left: No] [Right: No] Edema: [Left: Ye] [Right: s] Calf Left: Right: Point  of Measurement: 34 cm From Medial Instep 30.5 cm Ankle Left: Right: Point of Measurement: 9 cm From Medial Instep 20 cm Vascular Assessment Pulses: Dorsalis Pedis Palpable: [Left:Yes] Electronic Signature(s) Signed: 07/20/2021 4:49:51 PM By: Carlene Coria RN Entered By: Carlene Coria on 07/19/2021 13:18:18 Blea, Angelito L. ([id number]) -------------------------------------------------------------------------------- Multi Wound Chart Details Patient Name: [name] Date of Service: 07/19/2021 1:15 PM Medical Record Number: [id number] Patient Account Number: [id number] Date of Birth/Sex: 05/02/1941 (80 y.o. M) Treating RN: Carlene Coria Primary Care Kannan Proia: Jenna Luo Other Clinician: Referring Lida Berkery: Jenna Luo Treating Shimshon Narula/Extender: Skipper Cliche in Treatment: 8 Vital Signs Height(in): 68 Pulse(bpm): 2 Weight(lbs): 172 Blood Pressure(mmHg): 120/78 Body Mass Index(BMI): 26 Temperature(F): 98.3 Respiratory Rate(breaths/min): 18 Photos: [N/A:N/A] Wound Location: Left, Posterior Lower Leg Penis N/A Wounding Event: Gradually Appeared Gradually Appeared N/A Primary Etiology: Calciphylaxis Calciphylaxis N/A Comorbid History: Coronary Artery Disease, Type II Coronary Artery Disease, Type II N/A Diabetes Diabetes Date Acquired: 04/05/2021 05/05/2021 N/A Weeks of Treatment: 8 6 N/A Wound Status: Open Open N/A Clustered Wound: Yes No N/A Measurements L x W x D (cm) 20x14x0.4 1x1x0.1 N/A Area (cm) : 219.911 0.785 N/A Volume (cm) : 87.965 0.079 N/A % Reduction in Area: -27.30% 83.30% N/A % Reduction in Volume: -27.30% 83.20% N/A Classification: Full Thickness Without Exposed Full Thickness Without Exposed N/A Support Structures Support Structures Exudate Amount: Medium Medium N/A Exudate Type: Serosanguineous Serosanguineous N/A Exudate Color: red, brown red, brown N/A Granulation Amount: Small (1-33%) Medium (34-66%) N/A Granulation Quality: Red,  Pink N/A N/A Necrotic Amount: Large (67-100%) Medium (34-66%) N/A Necrotic Tissue: Eschar, Adherent [place] N/A Exposed Structures: Fat Layer (Subcutaneous Tissue): Fat Layer (Subcutaneous Tissue): N/A Yes Yes Fascia: No Fascia: No Tendon: No Tendon: No Muscle: No Muscle: No Joint: No Joint: No Bone: No Bone: No Epithelialization: None None N/A Treatment Notes Electronic Signature(s) Signed: 07/20/2021 4:49:51 PM By: Carlene Coria RN Entered By: Carlene Coria on 07/19/2021 13:25:22 Karney, Veniamin L. ([id number]) -------------------------------------------------------------------------------- [place] Details Patient Name: [name] Date of Service: 07/19/2021 1:15 PM Medical Record Number: [id number] Patient Account Number: [id number] Date of Birth/Sex: September 18, 1940 (80 y.o. M) Treating RN: Carlene Coria Primary Care Alynah Schone: Jenna Luo Other Clinician:  Referring Anecia Nusbaum: Jenna Luo Treating Dayvion Sans/Extender: Skipper Cliche in Treatment: 8 Active Inactive Abuse / Safety / Falls / Self Care Management Nursing Diagnoses: Potential for injury related to falls Goals: Patient will remain injury free related to falls Date Initiated: 05/24/2021 Target Resolution Date: 06/24/2021 Goal Status: Active Interventions: Assess Activities of Daily Living upon admission and as needed Assess fall risk on admission and as needed Assess: immobility, friction, shearing, incontinence upon admission and as needed Assess impairment of mobility on admission and as needed per policy Assess personal safety and home safety (as indicated) on admission and as needed Assess self care needs on admission and as needed Notes: Nutrition Nursing Diagnoses: Impaired glucose control: actual or potential Goals: Patient/caregiver agrees to and verbalizes understanding of need to use nutritional supplements and/or vitamins as  prescribed Date Initiated: 05/24/2021 Target Resolution Date: 06/25/2021 Goal Status: Active Interventions: Assess HgA1c results as ordered upon admission and as needed Assess patient nutrition upon admission and as needed per policy Notes: Wound/Skin Impairment Nursing Diagnoses: Knowledge deficit related to ulceration/compromised skin integrity Goals: Patient/caregiver will verbalize understanding of skin care regimen Date Initiated: 05/24/2021 Target Resolution Date: 06/25/2021 Goal Status: Active Ulcer/skin breakdown will have a volume reduction of 30% by week 4 Date Initiated: 05/24/2021 Target Resolution Date: 07/24/2021 Goal Status: Active Ulcer/skin breakdown will have a volume reduction of 50% by week 8 Date Initiated: 05/24/2021 Target Resolution Date: 08/24/2021 Goal Status: Active Ulcer/skin breakdown will have a volume reduction of 80% by week 12 Date Initiated: 05/24/2021 Target Resolution Date: 09/24/2021 ANDRES, BANTZ ([id number]) Goal Status: Active Ulcer/skin breakdown will heal within 14 weeks Date Initiated: 05/24/2021 Target Resolution Date: 10/22/2021 Goal Status: Active Interventions: Assess patient/caregiver ability to obtain necessary supplies Assess patient/caregiver ability to perform ulcer/skin care regimen upon admission and as needed Assess ulceration(s) every visit Notes: Electronic Signature(s) Signed: 07/20/2021 4:49:51 PM By: Carlene Coria RN Entered By: Carlene Coria on 07/19/2021 13:25:13 Giusto, Elijha L. ([id number]) -------------------------------------------------------------------------------- Pain Assessment Details Patient Name: [name] Date of Service: 07/19/2021 1:15 PM Medical Record Number: [id number] Patient Account Number: [id number] Date of Birth/Sex: 09-24-1940 (80 y.o. M) Treating RN: Carlene Coria Primary Care Aijalon Demuro: Jenna Luo Other Clinician: Referring Josedaniel Haye: Jenna Luo Treating  Earle Burson/Extender: Skipper Cliche in Treatment: 8 Active Problems Location of Pain Severity and Description of Pain Patient Has Paino No Site Locations Pain Management and Medication Current Pain Management: Electronic Signature(s) Signed: 07/20/2021 4:49:51 PM By: Carlene Coria RN Entered By: Carlene Coria on 07/19/2021 13:08:29 Rhem, Aadam Carlean Jews ([id number]) -------------------------------------------------------------------------------- Patient/Caregiver Education Details Patient Name: [name] Date of Service: 07/19/2021 1:15 PM Medical Record Number: [id number] Patient Account Number: [id number] Date of Birth/Gender: Mar 29, 1941 (80 y.o. M) Treating RN: Carlene Coria Primary Care Physician: Jenna Luo Other Clinician: Referring Physician: Jenna Luo Treating Physician/Extender: Skipper Cliche in Treatment: 8 Education Assessment Education Provided To: Patient Education Topics Provided Wound/Skin Impairment: Methods: Explain/Verbal Responses: State content correctly Electronic Signature(s) Signed: 07/20/2021 4:49:51 PM By: Carlene Coria RN Entered By: Carlene Coria on 07/19/2021 13:27:10 Prichard, Jonel L. ([id number]) -------------------------------------------------------------------------------- Wound Assessment Details Patient Name: Wilbert, Tae L. Date of Service: 07/19/2021 1:15 PM Medical Record Number: [id number] Patient Account Number: [id number] Date of Birth/Sex: 06/17/1941 (80 y.o. M) Treating RN: Carlene Coria Primary Care Bedelia Pong: Jenna Luo Other Clinician: Referring Sayge Salvato: Jenna Luo Treating Tessa Seaberry/Extender: Skipper Cliche in Treatment: 8 Wound Status Wound Number: 1 Primary Etiology: Calciphylaxis Wound Location: Left, Posterior Lower Leg Wound Status: Open Wounding Event: Gradually Appeared Comorbid  History: Coronary Artery Disease, Type II Diabetes Date Acquired: 04/05/2021 Weeks Of Treatment: 8 Clustered  Wound: Yes Photos Wound Measurements Length: (cm) 20 Width: (cm) 14 Depth: (cm) 0.4 Area: (cm) 219.911 Volume: (cm) 87.965 % Reduction in Area: -27.3% % Reduction in Volume: -27.3% Epithelialization: None Tunneling: No Undermining: No Wound Description Classification: Full Thickness Without Exposed Support Structures Exudate Amount: Medium Exudate Type: Serosanguineous Exudate Color: red, brown Foul Odor After Cleansing: No Slough/Fibrino Yes Wound Bed Granulation Amount: Small (1-33%) Exposed Structure Granulation Quality: Red, Pink Fascia Exposed: No Necrotic Amount: Large (67-100%) Fat Layer (Subcutaneous Tissue) Exposed: Yes Necrotic Quality: Eschar, Adherent Slough Tendon Exposed: No Muscle Exposed: No Joint Exposed: No Bone Exposed: No Treatment Notes Wound #1 (Lower Leg) Wound Laterality: Left, Posterior Cleanser Byram Ancillary Kit - 15 Day Supply Discharge Instruction: Use supplies as instructed; Kit contains: (15) Saline Bullets; (15) 3x3 Gauze; 15 pr Gloves Peri-Wound Care Scheidegger, [place] ([id number]) Topical Primary Dressing Kerlix AMD Roll Dressing, 4.5x 4.1 (in/in) Discharge Instruction: moisten with dakins Secondary Dressing Kerlix 4.5 x 4.1 (in/yd) Discharge Instruction: Apply Kerlix 4.5 x 4.1 (in/yd) as instructed Secured With 23M Medipore H Soft Cloth Surgical Tape, 2x2 (in/yd) Compression Wrap Compression Stockings Add-Ons Electronic Signature(s) Signed: 07/20/2021 4:49:51 PM By: Carlene Coria RN Entered By: Carlene Coria on 07/19/2021 13:17:06 Cabler, Nicholaus L. ([id number]) -------------------------------------------------------------------------------- Wound Assessment Details Patient Name: Pickert, Farid L. Date of Service: 07/19/2021 1:15 PM Medical Record Number: [id number] Patient Account Number: [id number] Date of Birth/Sex: [birth year] (80 y.o. M) Treating RN: Carlene Coria Primary Care Allianna Beaubien: Jenna Luo Other  Clinician: Referring Gurtaj Ruz: Jenna Luo Treating Raneen Jaffer/Extender: Skipper Cliche in Treatment: 8 Wound Status Wound Number: 2 Primary Etiology: Calciphylaxis Wound Location: Penis Wound Status: Open Wounding Event: Gradually Appeared Comorbid History: Coronary Artery Disease, Type II Diabetes Date Acquired: 05/05/2021 Weeks Of Treatment: 6 Clustered Wound: No Photos Wound Measurements Length: (cm) 1 Width: (cm) 1 Depth: (cm) 0.1 Area: (cm) 0.785 Volume: (cm) 0.079 % Reduction in Area: 83.3% % Reduction in Volume: 83.2% Epithelialization: None Tunneling: No Undermining: No Wound Description Classification: Full Thickness Without Exposed Support Structures Exudate Amount: Medium Exudate Type: Serosanguineous Exudate Color: red, brown Foul Odor After Cleansing: No Slough/Fibrino Yes Wound Bed Granulation Amount: Medium (34-66%) Exposed Structure Necrotic Amount: Medium (34-66%) Fascia Exposed: No Necrotic Quality: Eschar, Adherent Slough Fat Layer (Subcutaneous Tissue) Exposed: Yes Tendon Exposed: No Muscle Exposed: No Joint Exposed: No Bone Exposed: No Treatment Notes Wound #2 (Penis) Cleanser Byram Ancillary Kit - 15 Day Supply Discharge Instruction: Use supplies as instructed; Kit contains: (15) Saline Bullets; (15) 3x3 Gauze; 15 pr Gloves Peri-Wound Care Defibaugh, [place] ([id number]) Topical Primary Dressing Gauze Discharge Instruction: moistened with santyl Santyl Collagenase Ointment, 30 (gm), tube Secondary Dressing Secured With Compression Wrap Compression Stockings Add-Ons Electronic Signature(s) Signed: 07/20/2021 4:49:51 PM By: Carlene Coria RN Entered By: Carlene Coria on 07/19/2021 13:17:24 Durney, Dmonte L. ([id number]) -------------------------------------------------------------------------------- Vitals Details Patient Name: [name] Date of Service: 07/19/2021 1:15 PM Medical Record Number: [id number] Patient  Account Number: [id number] Date of Birth/Sex: [birth year] (80 y.o. M) Treating RN: Carlene Coria Primary Care Amadea Keagy: Jenna Luo Other Clinician: Referring Dalaney Needle: Jenna Luo Treating Deniece Rankin/Extender: Skipper Cliche in Treatment: 8 Vital Signs Time Taken: 13:08 Temperature (F): 98.3 Height (in): 68 Pulse (bpm): 79 Weight (lbs): 172 Respiratory Rate (breaths/min): 18 Body Mass Index (BMI): 26.1 Blood Pressure (mmHg): 120/78 Reference Range: 80 - 120 mg / dl Electronic Signature(s) Signed: 07/20/2021 4:49:51 PM By: Carlene Coria RN  Entered By: Carlene Coria on 07/19/2021 13:08:21

## 2021-07-23 DIAGNOSIS — D509 Iron deficiency anemia, unspecified: Secondary | ICD-10-CM | POA: Diagnosis not present

## 2021-07-23 DIAGNOSIS — E876 Hypokalemia: Secondary | ICD-10-CM | POA: Diagnosis not present

## 2021-07-23 DIAGNOSIS — Z992 Dependence on renal dialysis: Secondary | ICD-10-CM | POA: Diagnosis not present

## 2021-07-23 DIAGNOSIS — N2581 Secondary hyperparathyroidism of renal origin: Secondary | ICD-10-CM | POA: Diagnosis not present

## 2021-07-23 DIAGNOSIS — N186 End stage renal disease: Secondary | ICD-10-CM | POA: Diagnosis not present

## 2021-07-23 DIAGNOSIS — E1122 Type 2 diabetes mellitus with diabetic chronic kidney disease: Secondary | ICD-10-CM | POA: Diagnosis not present

## 2021-07-23 DIAGNOSIS — D689 Coagulation defect, unspecified: Secondary | ICD-10-CM | POA: Diagnosis not present

## 2021-07-24 ENCOUNTER — Encounter: Payer: Self-pay | Admitting: Family Medicine

## 2021-07-26 ENCOUNTER — Other Ambulatory Visit: Payer: Self-pay

## 2021-07-26 ENCOUNTER — Encounter: Payer: PPO | Admitting: Physician Assistant

## 2021-07-26 DIAGNOSIS — L98492 Non-pressure chronic ulcer of skin of other sites with fat layer exposed: Secondary | ICD-10-CM | POA: Diagnosis not present

## 2021-07-26 DIAGNOSIS — L97822 Non-pressure chronic ulcer of other part of left lower leg with fat layer exposed: Secondary | ICD-10-CM | POA: Diagnosis not present

## 2021-07-26 DIAGNOSIS — L97222 Non-pressure chronic ulcer of left calf with fat layer exposed: Secondary | ICD-10-CM | POA: Diagnosis not present

## 2021-07-26 DIAGNOSIS — N184 Chronic kidney disease, stage 4 (severe): Secondary | ICD-10-CM | POA: Diagnosis not present

## 2021-07-26 NOTE — Progress Notes (Signed)
Kassem, Ed L. ([id number]) °Visit Report for 07/26/2021 °Arrival Information Details °Patient Name: [name], [name] °Date of Service: 07/26/2021 1:15 PM °Medical Record Number: [id number] °Patient Account Number: [id number] °Date of Birth/Sex: 08/13/1940 (80 y.o. M) °Treating RN: Gordon, Caitlin °Primary Care Provider: Pickard, Warren Other Clinician: °Referring Provider: Pickard, Warren °Treating Provider/Extender: Stone, Hoyt °Weeks in Treatment: 9 °Visit Information History Since Last Visit °Added or deleted any medications: No °Patient Arrived: Walker °Any new allergies or adverse reactions: No °Arrival Time: 14:02 °Had a fall or experienced change in No °Accompanied By: wife °activities of daily living that may affect °Transfer Assistance: EasyPivot Patient °risk of falls: °Lift °Hospitalized since last visit: No °Patient Identification Verified: Yes °Has Dressing in Place as Prescribed: Yes °Secondary Verification Process Completed: Yes °Pain Present Now: No °Patient Requires Transmission-Based No °Precautions: °Patient Has Alerts: No °Electronic Signature(s) °Signed: 07/26/2021 4:14:20 PM By: Gordon, Caitlin °Entered By: Gordon, Caitlin on 07/26/2021 14:02:54 °Lantis, Sadat L. ([id number]) °-------------------------------------------------------------------------------- °Clinic Level of Care Assessment Details °Patient Name: [name], [name] °Date of Service: 07/26/2021 1:15 PM °Medical Record Number: [id number] °Patient Account Number: [id number] °Date of Birth/Sex: 11/29/1940 (80 y.o. M) °Treating RN: Gordon, Caitlin °Primary Care Provider: Pickard, Warren Other Clinician: °Referring Provider: Pickard, Warren °Treating Provider/Extender: Stone, Hoyt °Weeks in Treatment: 9 °Clinic Level of Care Assessment Items °TOOL 4 Quantity Score °X - Use when only an EandM is performed on FOLLOW-UP visit 1 0 °ASSESSMENTS - Nursing Assessment / Reassessment °[] - Reassessment of Co-morbidities (includes updates in  patient status) 0 °[] - 0 °Reassessment of Adherence to Treatment Plan °ASSESSMENTS - Wound and Skin Assessment / Reassessment °[] - Simple Wound Assessment / Reassessment - one wound 0 °X- 2 5 °Complex Wound Assessment / Reassessment - multiple wounds °[] - 0 °Dermatologic / Skin Assessment (not related to wound area) °ASSESSMENTS - Focused Assessment °[] - Circumferential Edema Measurements - multi extremities 0 °[] - 0 °Nutritional Assessment / Counseling / Intervention °[] - 0 °Lower Extremity Assessment (monofilament, tuning fork, pulses) °[] - 0 °Peripheral Arterial Disease Assessment (using hand held doppler) °ASSESSMENTS - Ostomy and/or Continence Assessment and Care °[] - Incontinence Assessment and Management 0 °[] - 0 °Ostomy Care Assessment and Management (repouching, etc.) °PROCESS - Coordination of Care °X - Simple Patient / Family Education for ongoing care 1 15 °[] - 0 °Complex (extensive) Patient / Family Education for ongoing care °[] - 0 °Staff obtains Consents, Records, Test Results / Process Orders °[] - 0 °Staff telephones HHA, Nursing Homes / Clarify orders / etc °[] - 0 °Routine Transfer to another Facility (non-emergent condition) °[] - 0 °Routine Hospital Admission (non-emergent condition) °[] - 0 °New Admissions / Insurance Authorizations / Ordering NPWT, Apligraf, etc. °[] - 0 °Emergency Hospital Admission (emergent condition) °X- 1 10 °Simple Discharge Coordination °[] - 0 °Complex (extensive) Discharge Coordination °PROCESS - Special Needs °[] - Pediatric / Minor Patient Management 0 °[] - 0 °Isolation Patient Management °[] - 0 °Hearing / Language / Visual special needs °[] - 0 °Assessment of Community assistance (transportation, D/C planning, etc.) °[] - 0 °Additional assistance / Altered mentation °[] - 0 °Support Surface(s) Assessment (bed, cushion, seat, etc.) °INTERVENTIONS - Wound Cleansing / Measurement °[name], [name] ([id number]) °[] - 0 °Simple Wound Cleansing - one  wound °X- 2 5 °Complex Wound Cleansing - multiple wounds °[] - 0 °Wound Imaging (photographs - any number of wounds) °[] - 0 °Wound Tracing (instead of photographs) °[] - 0 °Simple Wound Measurement -   one wound °[] - 0 °Complex Wound Measurement - multiple wounds °INTERVENTIONS - Wound Dressings °[] - Small Wound Dressing one or multiple wounds 0 °X- 2 15 °Medium Wound Dressing one or multiple wounds °[] - 0 °Large Wound Dressing one or multiple wounds °[] - 0 °Application of Medications - topical °[] - 0 °Application of Medications - injection °INTERVENTIONS - Miscellaneous °[] - External ear exam 0 °[] - 0 °Specimen Collection (cultures, biopsies, blood, body fluids, etc.) °[] - 0 °Specimen(s) / Culture(s) sent or taken to Lab for analysis °[] - 0 °Patient Transfer (multiple staff / Hoyer Lift / Similar devices) °[] - 0 °Simple Staple / Suture removal (25 or less) °[] - 0 °Complex Staple / Suture removal (26 or more) °[] - 0 °Hypo / Hyperglycemic Management (close monitor of Blood Glucose) °[] - 0 °Ankle / Brachial Index (ABI) - do not check if billed separately °X- 1 5 °Vital Signs °Has the patient been seen at the hospital within the last three years: Yes °Total Score: 80 °Level Of Care: New/Established - Level °3 °Electronic Signature(s) °Signed: 07/26/2021 4:14:20 PM By: Gordon, Caitlin °Entered By: Gordon, Caitlin on 07/26/2021 14:36:38 °[name], [name] ([id number]) °-------------------------------------------------------------------------------- °Encounter Discharge Information Details °Patient Name: [name], [name] °Date of Service: 07/26/2021 1:15 PM °Medical Record Number: [id number] °Patient Account Number: [id number] °Date of Birth/Sex: 07/15/1941 (80 y.o. M) °Treating RN: Gordon, Caitlin °Primary Care Provider: Pickard, Warren Other Clinician: °Referring Provider: Pickard, Warren °Treating Provider/Extender: Stone, Hoyt °Weeks in Treatment: 9 °Encounter Discharge Information Items °Discharge  Condition: Stable °Ambulatory Status: Walker °Discharge Destination: Home °Transportation: Private Auto °Accompanied By: wife °Schedule Follow-up Appointment: Yes °Clinical Summary of Care: °Electronic Signature(s) °Signed: 07/26/2021 3:51:35 PM By: Gordon, Caitlin °Entered By: Gordon, Caitlin on 07/26/2021 15:51:35 °[name], [name] ([id number]) °-------------------------------------------------------------------------------- °Lower Extremity Assessment Details °Patient Name: [name], [name] °Date of Service: 07/26/2021 1:15 PM °Medical Record Number: [id number] °Patient Account Number: [id number] °Date of Birth/Sex: 11/11/1940 (80 y.o. M) °Treating RN: Gordon, Caitlin °Primary Care Provider: Pickard, Warren Other Clinician: °Referring Provider: Pickard, Warren °Treating Provider/Extender: Stone, Hoyt °Weeks in Treatment: 9 °Edema Assessment °Assessed: [Left: No] [Right: No] °[Left: Edema] [Right: :] °Calf °Left: Right: °Point of Measurement: 34 cm From Medial Instep 34 cm °Ankle °Left: Right: °Point of Measurement: 9 cm From Medial Instep 21 cm °Electronic Signature(s) °Signed: 07/26/2021 4:14:20 PM By: Gordon, Caitlin °Entered By: Gordon, Caitlin on 07/26/2021 14:21:34 °[name], [name] ([id number]) °-------------------------------------------------------------------------------- °Multi Wound Chart Details °Patient Name: [name], [name] °Date of Service: 07/26/2021 1:15 PM °Medical Record Number: [id number] °Patient Account Number: [id number] °Date of Birth/Sex: 10/04/1940 (80 y.o. M) °Treating RN: Gordon, Caitlin °Primary Care Provider: Pickard, Warren Other Clinician: °Referring Provider: Pickard, Warren °Treating Provider/Extender: Stone, Hoyt °Weeks in Treatment: 9 °Vital Signs °Height(in): 68 °Pulse(bpm): 66 °Weight(lbs): 172 °Blood Pressure(mmHg): 126/74 °Body Mass Index(BMI): 26 °Temperature(Â°F): 98.41 °Respiratory Rate(breaths/min): 18 °Photos: [N/A:N/A] °Wound Location: Left, Posterior Lower Leg Penis  N/A °Wounding Event: Gradually Appeared Gradually Appeared N/A °Primary Etiology: Calciphylaxis Calciphylaxis N/A °Comorbid History: Coronary Artery Disease, Type II Coronary Artery Disease, Type II N/A °Diabetes Diabetes °Date Acquired: 04/05/2021 05/05/2021 N/A °Weeks of Treatment: 9 7 N/A °Wound Status: Open Open N/A °Clustered Wound: Yes No N/A °Measurements L x W x D (cm) 20x14x0.4 0.5x1x0.1 N/A °Area (cm²) : 219.911 0.393 N/A °Volume (cm³) : 87.965 0.039 N/A °% Reduction in Area: -27.30% 91.70% N/A °% Reduction in Volume: -27.30% 91.70% N/A °Classification: Full Thickness Without Exposed Full Thickness Without Exposed N/A °Support Structures Support   Structures Exudate Amount: Medium Medium N/A Exudate Type: Serosanguineous Serosanguineous N/A Exudate Color: red, brown red, brown N/A Granulation Amount: Medium (34-66%) Medium (34-66%) N/A Granulation Quality: Red, Pink N/A N/A Necrotic Amount: Medium (34-66%) Medium (34-66%) N/A Necrotic Tissue: Eschar, Adherent Melvern N/A Exposed Structures: Fat Layer (Subcutaneous Tissue): Fat Layer (Subcutaneous Tissue): N/A Yes Yes Fascia: No Fascia: No Tendon: No Tendon: No Muscle: No Muscle: No Joint: No Joint: No Bone: No Bone: No Epithelialization: None None N/A [name], [name] ([id number]) Treatment Notes Electronic Signature(s) Signed: 07/26/2021 4:14:20 PM By: Levora Dredge Entered By: Levora Dredge on 07/26/2021 14:33:06 [name], [name] ([id number]) -------------------------------------------------------------------------------- [place] Details Patient Name: Mark Morale L. Date of Service: 07/26/2021 1:15 PM Medical Record Number: [id number] Patient Account Number: [id number] Date of Birth/Sex: 1941-04-17 (80 y.o. M) Treating RN: Levora Dredge Primary Care Demitra Danley: Jenna Luo Other Clinician: Referring Arriah Wadle: Jenna Luo Treating Kacia Halley/Extender: Skipper Cliche in Treatment: 9 Active Inactive Abuse / Safety / Falls / Self Care Management Nursing Diagnoses: Potential for injury related to falls Goals: Patient will remain injury free related to falls Date Initiated: 05/24/2021 Target Resolution Date: 06/24/2021 Goal Status: Active Interventions: Assess Activities of Daily Living upon admission and as needed Assess fall risk on admission and as needed Assess: immobility, friction, shearing, incontinence upon admission and as needed Assess impairment of mobility on admission and as needed per policy Assess personal safety and home safety (as indicated) on admission and as needed Assess self care needs on admission and as needed Notes: Nutrition Nursing Diagnoses: Impaired glucose control: actual or potential Goals: Patient/caregiver agrees to and verbalizes understanding of need to use nutritional supplements and/or vitamins as prescribed Date Initiated: 05/24/2021 Target Resolution Date: 06/25/2021 Goal Status: Active Interventions: Assess HgA1c results as ordered upon admission and as needed Assess patient nutrition upon admission and as needed per policy Notes: Wound/Skin Impairment Nursing Diagnoses: Knowledge deficit related to ulceration/compromised skin integrity Goals: Patient/caregiver will verbalize understanding of skin care regimen Date Initiated: 05/24/2021 Target Resolution Date: 06/25/2021 Goal Status: Active Ulcer/skin breakdown will have a volume reduction of 30% by week 4 Date Initiated: 05/24/2021 Target Resolution Date: 07/24/2021 Goal Status: Active Ulcer/skin breakdown will have a volume reduction of 50% by week 8 Date Initiated: 05/24/2021 Target Resolution Date: 08/24/2021 Goal Status: Active Ulcer/skin breakdown will have a volume reduction of 80% by week 12 Date Initiated: 05/24/2021 Target Resolution Date: 09/24/2021 LADARRELL, CORNWALL ([id number]) Goal Status: Active Ulcer/skin breakdown  will heal within 14 weeks Date Initiated: 05/24/2021 Target Resolution Date: 10/22/2021 Goal Status: Active Interventions: Assess patient/caregiver ability to obtain necessary supplies Assess patient/caregiver ability to perform ulcer/skin care regimen upon admission and as needed Assess ulceration(s) every visit Notes: Electronic Signature(s) Signed: 07/26/2021 4:14:20 PM By: Levora Dredge Entered By: Levora Dredge on 07/26/2021 14:32:52 [name], [name] ([id number]) -------------------------------------------------------------------------------- Pain Assessment Details Patient Name: Mark Morale L. Date of Service: 07/26/2021 1:15 PM Medical Record Number: [id number] Patient Account Number: [id number] Date of Birth/Sex: 1941-08-03 (80 y.o. M) Treating RN: Levora Dredge Primary Care Joncarlo Friberg: Jenna Luo Other Clinician: Referring Topanga Alvelo: Jenna Luo Treating Cynara Tatham/Extender: Skipper Cliche in Treatment: 9 Active Problems Location of Pain Severity and Description of Pain Patient Has Paino No Site Locations Rate the pain. Current Pain Level: 0 Pain Management and Medication Current Pain Management: Electronic Signature(s) Signed: 07/26/2021 4:14:20 PM By: Levora Dredge Entered By: Levora Dredge on 07/26/2021 14:04:43 [name], Jamir L. ([id number]) -------------------------------------------------------------------------------- Patient/Caregiver Education Details Patient Name: Mark Morale L.  Date of Service: 07/26/2021 1:15 PM °Medical Record Number: [id number] °Patient Account Number: [id number] °Date of Birth/Gender: 08/11/1940 (80 y.o. M) °Treating RN: Gordon, Caitlin °Primary Care Physician: Pickard, Warren Other Clinician: °Referring Physician: Pickard, Warren °Treating Physician/Extender: Stone, Hoyt °Weeks in Treatment: 9 °Education Assessment °Education Provided To: °Patient °Education Topics Provided °Wound/Skin Impairment: °Handouts: Caring  for Your Ulcer °Methods: Explain/Verbal °Responses: State content correctly °Electronic Signature(s) °Signed: 07/26/2021 4:14:20 PM By: Gordon, Caitlin °Entered By: Gordon, Caitlin on 07/26/2021 14:46:01 °Eaves, Raford L. ([id number]) °-------------------------------------------------------------------------------- °Wound Assessment Details °Patient Name: Buege, Treasure L. °Date of Service: 07/26/2021 1:15 PM °Medical Record Number: [id number] °Patient Account Number: [id number] °Date of Birth/Sex: 02/06/1941 (80 y.o. M) °Treating RN: Gordon, Caitlin °Primary Care Provider: Pickard, Warren Other Clinician: °Referring Provider: Pickard, Warren °Treating Provider/Extender: Stone, Hoyt °Weeks in Treatment: 9 °Wound Status °Wound Number: 1 °Primary Etiology: Calciphylaxis °Wound Location: Left, Posterior Lower Leg °Wound Status: Open °Wounding Event: Gradually Appeared °Comorbid History: Coronary Artery Disease, Type II Diabetes °Date Acquired: 04/05/2021 °Weeks Of Treatment: 9 °Clustered Wound: Yes °Photos °Wound Measurements °Length: (cm) 20 °Width: (cm) 14 °Depth: (cm) 0.4 °Area: (cm²) 219.911 °Volume: (cm³) 87.965 °% Reduction in Area: -27.3% °% Reduction in Volume: -27.3% °Epithelialization: None °Wound Description °Classification: Full Thickness Without Exposed Support Structures °Exudate Amount: Medium °Exudate Type: Serosanguineous °Exudate Color: red, brown °Foul Odor After Cleansing: No °Slough/Fibrino Yes °Wound Bed °Granulation Amount: Medium (34-66%) Exposed Structure °Granulation Quality: Red, Pink °Fascia Exposed: No °Necrotic Amount: Medium (34-66%) °Fat Layer (Subcutaneous Tissue) Exposed: Yes °Necrotic Quality: Eschar, Adherent Slough °Tendon Exposed: No °Muscle Exposed: No °Joint Exposed: No °Bone Exposed: No °Treatment Notes °Wound #1 (Lower Leg) Wound Laterality: Left, Posterior °Cleanser °Byram Ancillary Kit - 15 Day Supply °Discharge Instruction: Use supplies as instructed; Kit contains: (15) Saline  Bullets; (15) 3x3 Gauze; 15 pr Gloves °Peri-Wound Care °Parady, Travus L. ([id number]) °Topical °Primary Dressing °Kerlix AMD Roll Dressing, 4.5x 4.1 (in/in) °Discharge Instruction: moisten with dakins °Secondary Dressing °Kerlix 4.5 x 4.1 (in/yd) °Discharge Instruction: Apply Kerlix 4.5 x 4.1 (in/yd) as instructed °Secured With °3M Medipore H Soft Cloth Surgical Tape, 2x2 (in/yd) °Compression Wrap °Compression Stockings °Add-Ons °Electronic Signature(s) °Signed: 07/26/2021 4:14:20 PM By: Gordon, Caitlin °Entered By: Gordon, Caitlin on 07/26/2021 14:16:09 °Dupler, Izaiha L. ([id number]) °-------------------------------------------------------------------------------- °Wound Assessment Details °Patient Name: Lepage, Timur L. °Date of Service: 07/26/2021 1:15 PM °Medical Record Number: [id number] °Patient Account Number: [id number] °Date of Birth/Sex: 07/29/1941 (80 y.o. M) °Treating RN: Gordon, Caitlin °Primary Care Provider: Pickard, Warren Other Clinician: °Referring Provider: Pickard, Warren °Treating Provider/Extender: Stone, Hoyt °Weeks in Treatment: 9 °Wound Status °Wound Number: 2 °Primary Etiology: Calciphylaxis °Wound Location: Penis °Wound Status: Open °Wounding Event: Gradually Appeared °Comorbid History: Coronary Artery Disease, Type II Diabetes °Date Acquired: 05/05/2021 °Weeks Of Treatment: 7 °Clustered Wound: No °Photos °Wound Measurements °Length: (cm) 0.5 °Width: (cm) 1 °Depth: (cm) 0.1 °Area: (cm²) 0.393 °Volume: (cm³) 0.039 °% Reduction in Area: 91.7% °% Reduction in Volume: 91.7% °Epithelialization: None °Tunneling: No °Undermining: No °Wound Description °Classification: Full Thickness Without Exposed Support Structures °Exudate Amount: Medium °Exudate Type: Serosanguineous °Exudate Color: red, brown °Foul Odor After Cleansing: No °Slough/Fibrino Yes °Wound Bed °Granulation Amount: Medium (34-66%) Exposed Structure °Necrotic Amount: Medium (34-66%) °Fascia Exposed: No °Necrotic Quality: Eschar,  Adherent Slough °Fat Layer (Subcutaneous Tissue) Exposed: Yes °Tendon Exposed: No °Muscle Exposed: No °Joint Exposed: No °Bone Exposed: No °Treatment Notes °Wound #2 (Penis) °Cleanser °Byram Ancillary Kit - 15 Day Supply °Discharge Instruction: Use supplies as instructed; Kit contains: (15) Saline Bullets; (15)   3x3 Gauze; 15 pr Gloves °Peri-Wound Care °Jim, Lavel L. ([id number]) °Topical °Primary Dressing °Gauze °Discharge Instruction: moistened with santyl °Santyl Collagenase Ointment, 30 (gm), tube °Secondary Dressing °Secured With °Compression Wrap °Compression Stockings °Add-Ons °Electronic Signature(s) °Signed: 07/26/2021 4:14:20 PM By: Gordon, Caitlin °Entered By: Gordon, Caitlin on 07/26/2021 14:19:51 °Radilla, Treydon L. ([id number]) °-------------------------------------------------------------------------------- °Vitals Details °Patient Name: Donati, [name] L. °Date of Service: 07/26/2021 1:15 PM °Medical Record Number: [id number] °Patient Account Number: [id number] °Date of Birth/Sex: 12/28/1940 (80 y.o. M) °Treating RN: Gordon, Caitlin °Primary Care Provider: Pickard, Warren Other Clinician: °Referring Provider: Pickard, Warren °Treating Provider/Extender: Stone, Hoyt °Weeks in Treatment: 9 °Vital Signs °Time Taken: 14:02 °Temperature (Â°F): 98.41 °Height (in): 68 °Pulse (bpm): 66 °Weight (lbs): 172 °Respiratory Rate (breaths/min): 18 °Body Mass Index (BMI): 26.1 °Blood Pressure (mmHg): 126/74 °Reference Range: 80 - 120 mg / dl °Electronic Signature(s) °Signed: 07/26/2021 4:14:20 PM By: Gordon, Caitlin °Entered By: Gordon, Caitlin on 07/26/2021 14:04:35 °

## 2021-07-26 NOTE — Progress Notes (Addendum)
TARANCE, BALAN ([id number]) Visit Report for 07/26/2021 Chief Complaint Document Details Patient Name: [name], [name]. Date of Service: 07/26/2021 1:15 PM Medical Record Number: [id number] Patient Account Number: [id number] Date of Birth/Sex: 1941-07-27 (80 y.o. 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 ([id number]) -------------------------------------------------------------------------------- HPI Details Patient Name: Mark Morale L. Date of Service: 07/26/2021 1:15 PM Medical Record Number: [id number] Patient Account Number: [id number] Date of Birth/Sex: 1940/11/15 (80 y.o. 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 This has been present for a couple of months currently he is on the sodium thiosulfate at dialysis. He tells me that they picked up on this quickly and have been managing it but he really has not had any help with anything else from the standpoint of progressing the wound itself. The patient does have again end-stage renal disease which currently is listed as stage IV but he is on renal dialysis. He also has coronary artery disease as well as going shortly for formal arterial studies although he did have  a quick test of the tibial artery with his vascular doctor when he went in to check on his fistula for his arm and subsequently they did not feel like he had any hemodynamically unstable flow into the lower extremity this is good news. Nonetheless I think that the biggest issue here is going to be getting some of the eschar off so that we get the wounds med headed in the appropriate direction. 05/31/2021 upon evaluation today patient appears to be doing well with regard to his leg ulceration with calciphylaxis. I am very pleased in this regard. Fortunately there does not appear to be any signs of active infection at this time which is great news. No fevers, chills, nausea, vomiting, or diarrhea. 06/07/2021 upon evaluation today patient appears to be doing well with regard to his legs. Unfortunately he is having an issue here with his penis as well he did mention at the end of last visit. I suggested that he go see urology. However when he went to see the urologist they basically told him there was not anything they could do. They recommended that we needed to be the ones to take care of this. With that being said this is an unusual area that to be honest a lot of the traditional wound care products we use are not really good to be good for this region. Potentially Medihoney alginate could be a possibility also think Santyl could be a possibility. With that being said I think initially my suggestion is probably can be for [place] to attempt the Santyl to see if that will benefit the patient. 06/21/2021 upon evaluation today patient's wound bed actually showed signs of good granulation and epithelization at this point. Fortunately I do not see any signs of active infection systemically which is great news and his legs are doing awesome I think you are making great progress. The Santyl on  the glans penis also has been of benefit this is looking tremendously better. Again the patient is pleased he also tells me  the pain is significantly improved. Overall I think we are headed in the appropriate direction here. 07/05/2021 upon evaluation today patient appears to be doing well with regard to his wounds. Both the area on the tip of his penis as well as the left lateral leg is doing well. There is some debridement this can be needed over the leg region. Fortunately I think that overall the Dakin's moistened gauze has done a great job here. No fevers, chills, nausea, vomiting, or diarrhea. 07/12/2021 upon evaluation today patient presents for reevaluation here in the clinic and overall I think that his wounds are doing significantly better which is great news. This includes both the left leg as well as the glans of the penis. Both are showing signs of significant improvement which is great and overall I think that we are headed in the right direction. I do not see any evidence of active infection at this moment. 07/19/2021 upon evaluation today patient appears to be doing better in regard to his wounds. Were getting much closer to complete resolution in regard to the necrotic tissue being removed from the leg. Overall I am extremely pleased with where we stand today. Overall I do not see any signs of active infection at this time. With regard to the patient's penis this area is showing signs of improvement as well and I am very pleased with that. 07/26/2021 upon evaluation today patient appears to be doing well with regard to his wounds. The leg in fact is looking quite well and I am very pleased with where we stand today. I do not see any signs of infection which is great and there is a lot of new granulation growth also also. With regard to the penis area this also showed signs of excellent improvement which is great news. Electronic Signature(s) Signed: 07/26/2021 5:08:29 PM By: Worthy Keeler PA-C Entered By: Worthy Keeler on 07/26/2021 17:08:29 [name], [name]  ([id number]) -------------------------------------------------------------------------------- Physical Exam Details Patient Name: [name], [name] Date of Service: 07/26/2021 1:15 PM Medical Record Number: [id number] Patient Account Number: [id number] Date of Birth/Sex: 18-Nov-1940 (80 y.o. M) Treating RN: Levora Dredge Primary Care Provider: Jenna Luo Other Clinician: Referring Provider: Jenna Luo Treating Provider/Extender: Skipper Cliche in Treatment: 9 Constitutional Well-nourished and well-hydrated in no acute distress. Respiratory normal breathing without difficulty. Psychiatric this patient is able to make decisions and demonstrates good insight into disease process. Alert and Oriented x 3. pleasant and cooperative. Notes Upon inspection patient's wound bed actually showed signs of good granulation and epithelization at this point. Fortunately I do not see any evidence of active infection locally nor systemically at this time and overall I think we are headed in the right direction the Annitta Needs is still doing a great job on the penis area and in regard to the leg of the Dakin's moistened gauze dressings are also on. Electronic Signature(s) Signed: 07/26/2021 5:09:00 PM By: Worthy Keeler PA-C Entered By: Worthy Keeler on 07/26/2021 17:09:00 Roediger, [name] ([id number]) -------------------------------------------------------------------------------- Physician Orders Details Patient Name: Mark Jumbo. Date of Service: 07/26/2021 1:15 PM Medical Record Number: [id number] Patient Account Number: [id number] Date of Birth/Sex: 1940/10/31 (80 y.o. 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 - due to holiday Edema Control - Lymphedema / Segmental Compressive Device / Other o Elevate, Exercise Daily and Avoid Standing for Long Periods of Time. o Elevate legs to the level of the heart and pump ankles as often as possible o Elevate leg(s) parallel to the floor when sitting. Wound Treatment Wound #1 - Lower Leg Wound Laterality: Left, Posterior Cleanser: Byram Ancillary Kit - 15 Day Supply (Generic) 1 x Per Day/30 Days Discharge Instructions: Use supplies as instructed; Kit contains: (15) Saline Bullets; (15) 3x3 Gauze; 15 pr Gloves Primary Dressing: Kerlix AMD Roll Dressing, 4.5x 4.1 (in/in) (Generic) 1 x Per Day/30 Days Discharge Instructions: moisten with dakins Secondary Dressing: Kerlix 4.5 x 4.1 (in/yd) (Generic) 1 x Per Day/30 Days Discharge Instructions: Apply Kerlix 4.5 x 4.1 (in/yd) as instructed Secured With: 35M Medipore H Soft Cloth Surgical Tape, 2x2 (in/yd) (Generic) 1 x Per Day/30 Days Wound #2 - Penis Cleanser: Byram Ancillary Kit - 15 Day Supply (Generic) 1 x Per Day/30 Days Discharge Instructions: Use supplies as instructed; Kit contains: (15) Saline Bullets; (15) 3x3 Gauze; 15 pr Gloves Primary Dressing: Gauze (Generic) 1 x Per Day/30 Days Discharge Instructions: moistened with santyl Primary Dressing: Santyl Collagenase Ointment, 30 (gm), tube 1 x Per Day/30 Days Electronic Signature(s) Signed: 07/26/2021 4:14:20 PM By: Levora Dredge Signed: 07/26/2021 6:19:38 PM By: Worthy Keeler PA-C Entered By: Levora Dredge on 07/26/2021 14:35:40 Ninh,  Jaidev L. ([id number]) -------------------------------------------------------------------------------- Problem List Details Patient Name: [name], [name] Date of Service: 07/26/2021 1:15 PM Medical Record Number: [id number] Patient Account Number: [id number] Date of Birth/Sex: 1940/10/22 (80 y.o. M) Treating RN: Levora Dredge Primary Care Provider: Jenna Luo Other Clinician: Referring Provider: Jenna Luo Treating Provider/Extender: Skipper Cliche in Treatment: 9 Active Problems ICD-10 Encounter Code Description Active Date MDM Diagnosis E83.50 Unspecified disorder of calcium metabolism 05/24/2021 No Yes L97.822 Non-pressure chronic ulcer of other part of left lower leg with fat layer 05/24/2021 No Yes exposed L98.492 Non-pressure chronic ulcer of skin of other sites with fat layer exposed 06/07/2021 No Yes Z99.2 Dependence on renal dialysis 05/24/2021 No Yes N18.4 Chronic kidney disease, stage 4 (severe) 05/24/2021 No Yes I25.10 Atherosclerotic heart disease of native coronary artery without angina 05/24/2021 No Yes pectoris Inactive Problems Resolved Problems Electronic Signature(s) Signed: 07/26/2021 2:29:51 PM By: Worthy Keeler PA-C Entered By: Worthy Keeler on 07/26/2021 14:29:50 Krieger, Ramzy L. ([id number]) -------------------------------------------------------------------------------- Progress Note Details Patient Name: [name], [name] Date of Service: 07/26/2021 1:15 PM Medical Record Number: [id number] Patient Account Number: [id number] Date of Birth/Sex: 1941-06-14 (80 y.o. 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 This has been present for a couple of months currently he is on the sodium thiosulfate at dialysis. He tells  me that they picked up on this quickly and have been managing it but he really has not had any help with anything else from the standpoint of progressing the wound itself. The patient does have again end-stage renal disease which currently is listed as stage IV but he is on renal dialysis. He also has coronary artery disease as well as going shortly for formal arterial studies although he did have a quick test of the tibial artery with his vascular doctor when he went in to check on his fistula for his arm and subsequently they did not feel like he had any hemodynamically unstable flow into the lower extremity this is good news. Nonetheless I think that the biggest issue here is going to be getting some of the eschar off so that we get the wounds med headed in the appropriate direction. 05/31/2021 upon evaluation today patient appears to be doing well with regard to his leg ulceration with calciphylaxis. I am very pleased in this regard. Fortunately there does not appear to be any signs of active infection at this time which is great news. No fevers, chills, nausea, vomiting, or diarrhea. 06/07/2021 upon evaluation today patient appears to be doing well with regard to his legs. Unfortunately he is having an issue here with his penis as well he did mention at the end of last visit. I suggested that he go see urology. However when he went to see the urologist they basically told him there was not anything they could do. They recommended that we needed to be the ones to take care of this. With that being said this is an unusual area that to be honest a lot of the traditional wound care products we use are not really good to be good for this  region. Potentially Medihoney alginate could be a possibility also think Santyl could be a possibility. With that being said I think initially my suggestion is probably can be for [place] to attempt the Santyl to see if that will benefit the patient. 06/21/2021 upon evaluation today patient's wound bed actually showed signs of good granulation and epithelization at this point. Fortunately I do not see any signs of active infection systemically which is great news and his legs are doing awesome I think you are making great progress. The Santyl on the glans penis also has been of benefit this is looking tremendously better. Again the patient is pleased he also tells me the pain is significantly improved. Overall I think we are headed in the appropriate direction here. 07/05/2021 upon evaluation today patient appears to be doing well with regard to his wounds. Both the area on the tip of his penis as well as the left lateral leg is doing well. There is some debridement this can be needed over the leg region. Fortunately I think that overall the Dakin's moistened gauze has done a great job here. No fevers, chills, nausea, vomiting, or diarrhea. 07/12/2021 upon evaluation today patient presents for reevaluation here in the clinic and overall I think that his wounds are doing significantly better which is great news. This includes both the left leg as well as the glans of the penis. Both are showing signs of significant improvement which is great and overall I think that we are headed in the right direction. I do not see any evidence of active infection at this moment. 07/19/2021 upon evaluation today patient appears to be doing better in regard to his wounds. Were getting much closer to complete resolution  in regard to the necrotic tissue being removed from the leg. Overall I am extremely pleased with where we stand today. Overall I do not see any signs of active infection at this time. With regard to the  patient's penis this area is showing signs of improvement as well and I am very pleased with that. 07/26/2021 upon evaluation today patient appears to be doing well with regard to his wounds. The leg in fact is looking quite well and I am very pleased with where we stand today. I do not see any signs of infection which is great and there is a lot of new granulation growth also also. With regard to the penis area this also showed signs of excellent improvement which is great news. Objective Constitutional Well-nourished and well-hydrated in no acute distress. Vitals Time Taken: 2:02 PM, Height: 68 in, Weight: 172 lbs, BMI: 26.1, Temperature: 98.41 F, Pulse: 66 bpm, Respiratory Rate: 18 breaths/min, Blood Pressure: 126/74 mmHg. [name], [name] ([id number]) Respiratory normal breathing without difficulty. Psychiatric this patient is able to make decisions and demonstrates good insight into disease process. Alert and Oriented x 3. pleasant and cooperative. General Notes: Upon inspection patient's wound bed actually showed signs of good granulation and epithelization at this point. Fortunately I do not see any evidence of active infection locally nor systemically at this time and overall I think we are headed in the right direction the Annitta Needs is still doing a great job on the penis area and in regard to the leg of the Dakin's moistened gauze dressings are also on. Integumentary (Hair, Skin) Wound #1 status is Open. Original cause of wound was Gradually Appeared. The date acquired was: 04/05/2021. The wound has been in treatment 9 weeks. The wound is located on the Left,Posterior Lower Leg. The wound measures 20cm length x 14cm width x 0.4cm depth; 219.911cm^2 area and 87.965cm^3 volume. There is Fat Layer (Subcutaneous Tissue) exposed. There is a medium amount of serosanguineous drainage noted. There is medium (34-66%) red, pink granulation within the wound bed. There is a medium (34-66%) amount  of necrotic tissue within the wound bed including Eschar and Adherent Slough. Wound #2 status is Open. Original cause of wound was Gradually Appeared. The date acquired was: 05/05/2021. The wound has been in treatment 7 weeks. The wound is located on the Penis. The wound measures 0.5cm length x 1cm width x 0.1cm depth; 0.393cm^2 area and 0.039cm^3 volume. There is Fat Layer (Subcutaneous Tissue) exposed. There is no tunneling or undermining noted. There is a medium amount of serosanguineous drainage noted. There is medium (34-66%) granulation within the wound bed. There is a medium (34-66%) amount of necrotic tissue within the wound bed including Eschar and Adherent Slough. 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 - due to holiday Edema Control - Lymphedema / Segmental Compressive Device / Other: Elevate, Exercise Daily and Avoid Standing for Long Periods of Time. Elevate legs to the level of the heart and pump ankles as often as possible Elevate leg(s) parallel to the floor when sitting. WOUND #1: - Lower Leg Wound Laterality: Left, Posterior Cleanser: Byram Ancillary Kit - 15 Day Supply (Generic) 1 x Per Day/30 Days  Discharge Instructions: Use supplies as instructed; Kit contains: (15) Saline Bullets; (15) 3x3 Gauze; 15 pr Gloves Primary Dressing: Kerlix AMD Roll Dressing, 4.5x 4.1 (in/in) (Generic) 1 x Per Day/30 Days Discharge Instructions: moisten with dakins Secondary Dressing: Kerlix 4.5 x 4.1 (in/yd) (Generic) 1 x Per Day/30 Days Discharge Instructions: Apply Kerlix 4.5 x 4.1 (in/yd) as  instructed Secured With: 40M Medipore H Soft Cloth Surgical Tape, 2x2 (in/yd) (Generic) 1 x Per Day/30 Days WOUND #2: - Penis Wound Laterality: Cleanser: Byram Ancillary Kit - 15 Day Supply (Generic) 1 x Per Day/30 Days Discharge Instructions: Use supplies as instructed; Kit contains: (15) Saline Bullets; (15) 3x3 Gauze; 15 pr Gloves Primary Dressing: Gauze (Generic) 1 x Per Day/30 Days Discharge Instructions: moistened with santyl Primary Dressing: Santyl Collagenase Ointment, 30 (gm), tube 1 x Per Day/30 Days 1. I am going to suggest that we go ahead and continue with the wound care measures as before and the patient is in agreement with the plan. This includes the use of the Santyl for the penis. 2. I am also can recommend that we have the patient continue with the Dakin's moistened gauze dressing to the legs. 3. I am also going to suggest that he continue with roll gauze to secure in place in regard to the legs. [name], Mark ([id number]) We will see patient back for reevaluation in 2 weeks here in the clinic. If anything worsens or changes patient will contact our office for additional recommendations. Electronic Signature(s) Signed: 07/26/2021 5:09:22 PM By: Worthy Keeler PA-C Entered By: Worthy Keeler on 07/26/2021 17:09:22 [name], Mark Carlean Jews ([id number]) -------------------------------------------------------------------------------- SuperBill Details Patient Name: Mark Morale L. Date of Service: 07/26/2021 Medical Record Number: [id number] Patient Account Number: [id number] Date of Birth/Sex: Jul 23, 1941 (80 y.o. M) Treating RN: Levora Dredge Primary Care Provider: Jenna Luo Other Clinician: Referring Provider: Jenna Luo Treating Provider/Extender: Skipper Cliche in Treatment: 9 Diagnosis Coding ICD-10 Codes Code Description E83.50 Unspecified disorder of calcium metabolism L97.822 Non-pressure chronic ulcer of other part of left lower leg with fat  layer exposed L98.492 Non-pressure chronic ulcer of skin of other sites with fat layer exposed Z99.2 Dependence on renal dialysis N18.4 Chronic kidney disease, stage 4 (severe) I25.10 Atherosclerotic heart disease of native coronary artery without angina pectoris Facility Procedures CPT4 Code: 10626948 Description: 99213 - WOUND CARE VISIT-LEV 3 EST PT Modifier: Quantity: 1 Physician Procedures CPT4 Code: 5462703 Description: 99214 - WC PHYS LEVEL 4 - EST PT Modifier: Quantity: 1 CPT4 Code: Description: ICD-10 Diagnosis Description E83.50 Unspecified disorder of calcium metabolism L97.822 Non-pressure chronic ulcer of other part of left lower leg with fat lay L98.492 Non-pressure chronic ulcer of skin of other sites with fat layer expose  Z99.2 Dependence on renal dialysis Modifier: er exposed d Quantity: Electronic Signature(s) Signed: 07/26/2021 5:09:50 PM By: Worthy Keeler PA-C Previous Signature: 07/26/2021 4:14:20 PM Version By: Levora Dredge Entered By: Worthy Keeler on 07/26/2021 17:09:50

## 2021-07-30 DIAGNOSIS — Z992 Dependence on renal dialysis: Secondary | ICD-10-CM | POA: Diagnosis not present

## 2021-07-30 DIAGNOSIS — G9341 Metabolic encephalopathy: Secondary | ICD-10-CM | POA: Diagnosis not present

## 2021-07-30 DIAGNOSIS — N2581 Secondary hyperparathyroidism of renal origin: Secondary | ICD-10-CM | POA: Diagnosis not present

## 2021-07-30 DIAGNOSIS — A419 Sepsis, unspecified organism: Secondary | ICD-10-CM | POA: Diagnosis not present

## 2021-07-30 DIAGNOSIS — D631 Anemia in chronic kidney disease: Secondary | ICD-10-CM | POA: Diagnosis not present

## 2021-07-30 DIAGNOSIS — D509 Iron deficiency anemia, unspecified: Secondary | ICD-10-CM | POA: Diagnosis not present

## 2021-07-30 DIAGNOSIS — N186 End stage renal disease: Secondary | ICD-10-CM | POA: Diagnosis not present

## 2021-07-30 DIAGNOSIS — N189 Chronic kidney disease, unspecified: Secondary | ICD-10-CM | POA: Diagnosis not present

## 2021-07-30 DIAGNOSIS — D689 Coagulation defect, unspecified: Secondary | ICD-10-CM | POA: Diagnosis not present

## 2021-07-30 DIAGNOSIS — R52 Pain, unspecified: Secondary | ICD-10-CM | POA: Diagnosis not present

## 2021-07-30 DIAGNOSIS — E876 Hypokalemia: Secondary | ICD-10-CM | POA: Diagnosis not present

## 2021-08-01 ENCOUNTER — Encounter: Payer: Self-pay | Admitting: Family Medicine

## 2021-08-01 DIAGNOSIS — R5381 Other malaise: Secondary | ICD-10-CM

## 2021-08-04 DIAGNOSIS — N186 End stage renal disease: Secondary | ICD-10-CM | POA: Diagnosis not present

## 2021-08-04 DIAGNOSIS — Z992 Dependence on renal dialysis: Secondary | ICD-10-CM | POA: Diagnosis not present

## 2021-08-04 DIAGNOSIS — E1122 Type 2 diabetes mellitus with diabetic chronic kidney disease: Secondary | ICD-10-CM | POA: Diagnosis not present

## 2021-08-05 ENCOUNTER — Encounter: Payer: Self-pay | Admitting: Family Medicine

## 2021-08-06 DIAGNOSIS — N186 End stage renal disease: Secondary | ICD-10-CM | POA: Diagnosis not present

## 2021-08-06 DIAGNOSIS — N2581 Secondary hyperparathyroidism of renal origin: Secondary | ICD-10-CM | POA: Diagnosis not present

## 2021-08-06 DIAGNOSIS — Z992 Dependence on renal dialysis: Secondary | ICD-10-CM | POA: Diagnosis not present

## 2021-08-06 DIAGNOSIS — E876 Hypokalemia: Secondary | ICD-10-CM | POA: Diagnosis not present

## 2021-08-06 DIAGNOSIS — D509 Iron deficiency anemia, unspecified: Secondary | ICD-10-CM | POA: Diagnosis not present

## 2021-08-06 DIAGNOSIS — D689 Coagulation defect, unspecified: Secondary | ICD-10-CM | POA: Diagnosis not present

## 2021-08-08 ENCOUNTER — Telehealth: Payer: Self-pay | Admitting: Family Medicine

## 2021-08-08 NOTE — Telephone Encounter (Signed)
Left message for patient to call back and schedule Medicare Annual Wellness Visit (AWV) in office.   If not able to come in office, please offer to do virtually or by telephone.  Left office number and my jabber [phone].  Last AWV:: 01/07/2020  Please schedule at anytime with Nurse Health Advisor.

## 2021-08-13 DIAGNOSIS — E876 Hypokalemia: Secondary | ICD-10-CM | POA: Diagnosis not present

## 2021-08-13 DIAGNOSIS — Z992 Dependence on renal dialysis: Secondary | ICD-10-CM | POA: Diagnosis not present

## 2021-08-13 DIAGNOSIS — D689 Coagulation defect, unspecified: Secondary | ICD-10-CM | POA: Diagnosis not present

## 2021-08-13 DIAGNOSIS — N2581 Secondary hyperparathyroidism of renal origin: Secondary | ICD-10-CM | POA: Diagnosis not present

## 2021-08-13 DIAGNOSIS — D509 Iron deficiency anemia, unspecified: Secondary | ICD-10-CM | POA: Diagnosis not present

## 2021-08-13 DIAGNOSIS — N186 End stage renal disease: Secondary | ICD-10-CM | POA: Diagnosis not present

## 2021-08-14 ENCOUNTER — Other Ambulatory Visit: Payer: Self-pay

## 2021-08-14 ENCOUNTER — Encounter: Payer: PPO | Attending: Physician Assistant | Admitting: Physician Assistant

## 2021-08-14 DIAGNOSIS — N184 Chronic kidney disease, stage 4 (severe): Secondary | ICD-10-CM | POA: Diagnosis not present

## 2021-08-14 DIAGNOSIS — N186 End stage renal disease: Secondary | ICD-10-CM | POA: Diagnosis not present

## 2021-08-14 DIAGNOSIS — L97822 Non-pressure chronic ulcer of other part of left lower leg with fat layer exposed: Secondary | ICD-10-CM | POA: Insufficient documentation

## 2021-08-14 DIAGNOSIS — I120 Hypertensive chronic kidney disease with stage 5 chronic kidney disease or end stage renal disease: Secondary | ICD-10-CM | POA: Insufficient documentation

## 2021-08-14 DIAGNOSIS — L98492 Non-pressure chronic ulcer of skin of other sites with fat layer exposed: Secondary | ICD-10-CM | POA: Diagnosis not present

## 2021-08-14 DIAGNOSIS — E11622 Type 2 diabetes mellitus with other skin ulcer: Secondary | ICD-10-CM | POA: Diagnosis not present

## 2021-08-14 DIAGNOSIS — Z992 Dependence on renal dialysis: Secondary | ICD-10-CM | POA: Insufficient documentation

## 2021-08-14 DIAGNOSIS — L98499 Non-pressure chronic ulcer of skin of other sites with unspecified severity: Secondary | ICD-10-CM | POA: Insufficient documentation

## 2021-08-14 DIAGNOSIS — L97222 Non-pressure chronic ulcer of left calf with fat layer exposed: Secondary | ICD-10-CM | POA: Diagnosis not present

## 2021-08-14 DIAGNOSIS — E1151 Type 2 diabetes mellitus with diabetic peripheral angiopathy without gangrene: Secondary | ICD-10-CM | POA: Diagnosis not present

## 2021-08-14 DIAGNOSIS — E1122 Type 2 diabetes mellitus with diabetic chronic kidney disease: Secondary | ICD-10-CM | POA: Insufficient documentation

## 2021-08-14 NOTE — Progress Notes (Addendum)
LATERRIAN, HEVENER ([id number]) Visit Report for 08/14/2021 Chief Complaint Document Details Patient Name: [name], [name]. Date of Service: 08/14/2021 2:00 PM Medical Record Number: [id number] Patient Account Number: [id number] Date of Birth/Sex: [birth year] (81 y.o. 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 ([id number]) -------------------------------------------------------------------------------- Debridement Details Patient Name: Mark Morale L. Date of Service: 08/14/2021 2:00 PM Medical Record Number: [id number] Patient Account Number: [id number] Date of Birth/Sex: 1940/10/12 (81 y.o. M) Treating RN: Carlene Coria Primary Care Provider: Jenna Luo Other Clinician: Referring Provider: Jenna Luo Treating Provider/Extender: Skipper Cliche in Treatment: 11 Debridement Performed for Wound #1 Left,Posterior Lower Leg Assessment: Performed By: Physician Tommie Sams., PA-C Debridement Type: Debridement Level of Consciousness (Pre- Awake and Alert procedure): Pre-procedure Verification/Time Out Yes - 14:40 Taken: Start Time: 14:40 Pain Control: Lidocaine 4% Topical Solution Total Area Debrided (L x W): 3 (cm) x 3 (cm) = 9 (cm) Tissue and other material Viable, Non-Viable, Slough, Subcutaneous, Skin: Dermis , Skin: Epidermis, Slough debrided: Level: Skin/Subcutaneous Tissue Debridement Description: Excisional Instrument: Curette Bleeding: Minimum Hemostasis Achieved: Pressure End Time: 14:45 Procedural Pain: 0 Post Procedural Pain: 0 Response to Treatment: Procedure  was tolerated well Level of Consciousness (Post- Awake and Alert procedure): Post Debridement Measurements of Total Wound Length: (cm) 20 Width: (cm) 14 Depth: (cm) 0.4 Volume: (cm) 87.965 Character of Wound/Ulcer Post Debridement: Improved Post Procedure Diagnosis Same as Pre-procedure Electronic Signature(s) Signed: 08/14/2021 3:01:02 PM By: Worthy Keeler PA-C Signed: 08/15/2021 6:47:24 PM By: Carlene Coria RN Entered By: Worthy Keeler on 08/14/2021 15:01:02 Dearmond, Murtaza L. ([id number]) -------------------------------------------------------------------------------- HPI Details Patient Name: [name], [name] Date of Service: 08/14/2021 2:00 PM Medical Record Number: [id number] Patient Account Number: [id number] Date of Birth/Sex: 1941/02/24 (81 y.o. 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 This has been present for a couple of months currently he is on the sodium thiosulfate at dialysis. He tells me that they picked up on this quickly and have been managing it but he really has not had any help with anything else from the standpoint of progressing the wound itself. The patient does have again end-stage renal disease which currently is listed as stage IV but he is on renal dialysis. He also has coronary artery disease as well as going shortly for formal arterial studies although he did have a quick test of the tibial artery with his vascular doctor when he went in to check on his fistula for his arm and subsequently they did not feel like he had any hemodynamically unstable flow into the lower extremity this  is good news. Nonetheless I think that the biggest issue here is going to be getting some of the eschar off so that we get the wounds med headed in the appropriate direction. 05/31/2021 upon evaluation today patient appears to be doing well with regard to his leg ulceration with calciphylaxis. I am very pleased in this regard. Fortunately  there does not appear to be any signs of active infection at this time which is great news. No fevers, chills, nausea, vomiting, or diarrhea. 06/07/2021 upon evaluation today patient appears to be doing well with regard to his legs. Unfortunately he is having an issue here with his penis as well he did mention at the end of last visit. I suggested that he go see urology. However when he went to see the urologist they basically told him there was not anything they could do. They recommended that we needed to be the ones to take care of this. With that being said this is an unusual area that to be honest a lot of the traditional wound care products we use are not really good to be good for this region. Potentially Medihoney alginate could be a possibility also think Santyl could be a possibility. With that being said I think initially my suggestion is probably can be for [place] to attempt the Santyl to see if that will benefit the patient. 06/21/2021 upon evaluation today patient's wound bed actually showed signs of good granulation and epithelization at this point. Fortunately I do not see any signs of active infection systemically which is great news and his legs are doing awesome I think you are making great progress. The Santyl on the glans penis also has been of benefit this is looking tremendously better. Again the patient is pleased he also tells me the pain is significantly improved. Overall I think we are headed in the appropriate direction here. 07/05/2021 upon evaluation today patient appears to be doing well with regard to his wounds. Both the area on the tip of  his penis as well as the left lateral leg is doing well. There is some debridement this can be needed over the leg region. Fortunately I think that overall the Dakin's moistened gauze has done a great job here. No fevers, chills, nausea, vomiting, or diarrhea. 07/12/2021 upon evaluation today patient presents for reevaluation here in the clinic and overall I think that his wounds are doing significantly better which is great news. This includes both the left leg as well as the glans of the penis. Both are showing signs of significant improvement which is great and overall I think that we are headed in the right direction. I do not see any evidence of active infection at this moment. 07/19/2021 upon evaluation today patient appears to be doing better in regard to his wounds. Were getting much closer to complete resolution in regard to the necrotic tissue being removed from the leg. Overall I am extremely pleased with where we stand today. Overall I do not see any signs of active infection at this time. With regard to the patient's penis this area is showing signs of improvement as well and I am very pleased with that. 07/26/2021 upon evaluation today patient appears to be doing well with regard to his wounds. The leg in fact is looking quite well and I am very pleased with where we stand today. I do not see any signs of infection which is great and there is a lot of new granulation growth also also. With regard to the penis area this also showed signs of excellent improvement which is great news. 08/14/2020 upon evaluation today patient appears to be doing well with regard to his wounds both the leg as well as the glans penis region. Both are showing signs of improvement which is great news and overall I am extremely pleased with where we  stand at this point. I think that the Dakin's moistened gauze dressing for her leg is doing great and the Santyl is definitely helping with the glans penis  location. Electronic Signature(s) Signed: 08/14/2021 3:00:13 PM By: Worthy Keeler PA-C Entered By: Worthy Keeler on 08/14/2021 15:00:13 [name], [name] ([id number]) -------------------------------------------------------------------------------- Physical Exam Details Patient Name: [name], [name] Date of Service: 08/14/2021 2:00 PM Medical Record Number: [id number] Patient Account Number: [id number] Date of Birth/Sex: 1941-02-10 (81 y.o. M) Treating RN: Carlene Coria Primary Care Provider: Jenna Luo Other Clinician: Referring Provider: Jenna Luo Treating Provider/Extender: Skipper Cliche in Treatment: 74 Constitutional Well-nourished and well-hydrated in no acute distress. Respiratory normal breathing without difficulty. Psychiatric this patient is able to make decisions and demonstrates good insight into disease process. Alert and Oriented x 3. pleasant and cooperative. Notes Upon inspection currently patient showed signs of good granulation and epithelization at this point. Fortunately I do not see any signs of active infection which is great news and overall I think that were making progress in the right direction here currently. Both wound locations are improving I did perform some debridement in regard to the leg today he tolerated that without complication. Electronic Signature(s) Signed: 08/14/2021 3:00:39 PM By: Worthy Keeler PA-C Entered By: Worthy Keeler on 08/14/2021 15:00:39 [name], [name] ([id number]) -------------------------------------------------------------------------------- Physician Orders Details Patient Name: Mark Morale L. Date of Service: 08/14/2021 2:00 PM Medical Record Number: [id number] Patient Account Number: [id number] Date of Birth/Sex: 1941/04/28 (81 y.o. 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 Edema Control - Lymphedema / Segmental Compressive Device / Other o Elevate, Exercise Daily and Avoid Standing for Long Periods of Time. o Elevate legs to the level of the heart and pump ankles as often as possible o Elevate leg(s) parallel to the floor when sitting. Wound Treatment Wound #1 - Lower Leg Wound Laterality: Left, Posterior Cleanser: Byram Ancillary Kit - 15 Day Supply (Generic) 1 x Per Day/30 Days Discharge Instructions: Use supplies as instructed; Kit contains: (15) Saline Bullets; (15) 3x3 Gauze; 15 pr Gloves Primary Dressing: Kerlix AMD Roll Dressing, 4.5x 4.1 (in/in) (Generic) 1 x Per Day/30 Days Discharge Instructions: moisten with dakins Secondary Dressing: Kerlix 4.5 x 4.1 (in/yd) (Generic) 1 x Per Day/30 Days Discharge Instructions: Apply Kerlix 4.5 x 4.1 (in/yd) as instructed Secured With: 38M Medipore H Soft Cloth Surgical Tape, 2x2 (in/yd) (Generic) 1 x Per Day/30 Days Wound #2 - Penis Cleanser: Byram Ancillary Kit - 15 Day Supply (Generic) 1 x Per Day/30 Days Discharge Instructions: Use supplies as instructed; Kit contains: (15) Saline Bullets; (15) 3x3 Gauze; 15 pr Gloves Primary Dressing: Gauze (Generic) 1 x Per Day/30 Days Discharge Instructions: moistened with santyl Primary Dressing: Santyl Collagenase Ointment, 30 (gm), tube 1 x Per Day/30 Days Electronic Signature(s) Signed: 08/15/2021 12:22:51 PM By: Worthy Keeler  PA-C Signed: 08/15/2021 6:47:24 PM By: Carlene Coria RN Previous Signature: 08/14/2021 4:15:58 PM Version By: Worthy Keeler  PA-C Entered By: Carlene Coria on 08/14/2021 16:15:51 [name], GranbyMarland Kitchen ([id number]) -------------------------------------------------------------------------------- Problem List Details Patient Name: [name], [name] Date of Service: 08/14/2021 2:00 PM Medical Record Number: [id number] Patient Account Number: [id number] Date of Birth/Sex: Nov 02, 1940 (81 y.o. M) Treating RN: Carlene Coria Primary Care Provider: Jenna Luo Other Clinician: Referring Provider: Jenna Luo Treating Provider/Extender: Skipper Cliche in Treatment: 11 Active Problems ICD-10 Encounter Code Description Active Date MDM Diagnosis E83.50 Unspecified disorder of calcium metabolism 05/24/2021 No Yes L97.822 Non-pressure chronic ulcer of other part of left lower leg with fat layer 05/24/2021 No Yes exposed L98.492 Non-pressure chronic ulcer of skin of other sites with fat layer exposed 06/07/2021 No Yes Z99.2 Dependence on renal dialysis 05/24/2021 No Yes N18.4 Chronic kidney disease, stage 4 (severe) 05/24/2021 No Yes I25.10 Atherosclerotic heart disease of native coronary artery without angina 05/24/2021 No Yes pectoris Inactive Problems Resolved Problems Electronic Signature(s) Signed: 08/14/2021 2:16:09 PM By: Worthy Keeler PA-C Entered By: Worthy Keeler on 08/14/2021 14:16:09 [name], [name] ([id number]) -------------------------------------------------------------------------------- Progress Note Details Patient Name: [name], [name] Date of Service: 08/14/2021 2:00 PM Medical Record Number: [id number] Patient Account Number: [id number] Date of Birth/Sex: 06-Feb-1941 (81 y.o. 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 This has been present for a couple of months currently he is on the sodium thiosulfate at dialysis. He tells me that they picked up on this quickly and have been managing it but he really has not had any help with anything else from the standpoint of progressing the wound itself. The patient does have again end-stage renal disease which currently is listed as stage IV but he is on renal dialysis. He also has coronary artery disease as well as going shortly for formal arterial studies although he did have a quick test of the tibial artery with his vascular doctor when he went in to check on his fistula for his arm and subsequently they did not feel like he had any hemodynamically unstable flow into the lower extremity this is good news. Nonetheless I think that the biggest issue here is going to be getting some of the eschar off so that we get the wounds med headed in the appropriate direction. 05/31/2021 upon evaluation today patient appears to be doing well with regard to his leg ulceration with calciphylaxis. I am very pleased in this regard. Fortunately there does not appear to be any signs of active infection at this time which is great news. No fevers, chills, nausea, vomiting, or diarrhea. 06/07/2021 upon evaluation today patient appears to be doing well with regard to his legs. Unfortunately he is having an issue here with his penis as well he did mention at the end of last visit. I suggested that he go see urology. However when he went to see the urologist they basically told him there was not anything they could do. They recommended that we needed to be the  ones to take care of this. With that being said this is an unusual area that to be honest a lot of the traditional wound care products we use are not really good to  be good for this region. Potentially Medihoney alginate could be a possibility also think Santyl could be a possibility. With that being said I think initially my suggestion is probably can be for [place] to attempt the Santyl to see if that will benefit the patient. 06/21/2021 upon evaluation today patient's wound bed actually showed signs of good granulation and epithelization at this point. Fortunately I do not see any signs of active infection systemically which is great news and his legs are doing awesome I think you are making great progress. The Santyl on the glans penis also has been of benefit this is looking tremendously better. Again the patient is pleased he also tells me the pain is significantly improved. Overall I think we are headed in the appropriate direction here. 07/05/2021 upon evaluation today patient appears to be doing well with regard to his wounds. Both the area on the tip of his penis as well as the left lateral leg is doing well. There is some debridement this can be needed over the leg region. Fortunately I think that overall the Dakin's moistened gauze has done a great job here. No fevers, chills, nausea, vomiting, or diarrhea. 07/12/2021 upon evaluation today patient presents for reevaluation here in the clinic and overall I think that his wounds are doing significantly better which is great news. This includes both the left leg as well as the glans of the penis. Both are showing signs of significant improvement which is great and overall I think that we are headed in the right direction. I do not see any evidence of active infection at this moment. 07/19/2021 upon evaluation today patient appears to be doing better in regard to his wounds. Were getting much closer to complete resolution in regard to the necrotic  tissue being removed from the leg. Overall I am extremely pleased with where we stand today. Overall I do not see any signs of active infection at this time. With regard to the patient's penis this area is showing signs of improvement as well and I am very pleased with that. 07/26/2021 upon evaluation today patient appears to be doing well with regard to his wounds. The leg in fact is looking quite well and I am very pleased with where we stand today. I do not see any signs of infection which is great and there is a lot of new granulation growth also also. With regard to the penis area this also showed signs of excellent improvement which is great news. 08/14/2020 upon evaluation today patient appears to be doing well with regard to his wounds both the leg as well as the glans penis region. Both are showing signs of improvement which is great news and overall I am extremely pleased with where we stand at this point. I think that the Dakin's moistened gauze dressing for her leg is doing great and the Santyl is definitely helping with the glans penis location. Objective Constitutional Well-nourished and well-hydrated in no acute distress. [name], [name] ([id number]) Vitals Time Taken: 2:10 PM, Height: 68 in, Weight: 172 lbs, BMI: 26.1, Temperature: 97.7 F, Pulse: 82 bpm, Respiratory Rate: 18 breaths/min, Blood Pressure: 135/74 mmHg. Respiratory normal breathing without difficulty. Psychiatric this patient is able to make decisions and demonstrates good insight into disease process. Alert and Oriented x 3. pleasant and cooperative. General Notes: Upon inspection currently patient showed signs of good granulation and epithelization at this point. Fortunately I do not see any signs of active infection which is great news and overall  I think that were making progress in the right direction here currently. Both wound locations are improving I did perform some debridement in regard to the leg today  he tolerated that without complication. Integumentary (Hair, Skin) Wound #1 status is Open. Original cause of wound was Gradually Appeared. The date acquired was: 04/05/2021. The wound has been in treatment 11 weeks. The wound is located on the Left,Posterior Lower Leg. The wound measures 20cm length x 14cm width x 0.4cm depth; 219.911cm^2 area and 87.965cm^3 volume. There is Fat Layer (Subcutaneous Tissue) exposed. There is no tunneling or undermining noted. There is a medium amount of serosanguineous drainage noted. There is medium (34-66%) red, pink granulation within the wound bed. There is a medium (34-66%) amount of necrotic tissue within the wound bed including Eschar and Adherent Slough. Wound #2 status is Open. Original cause of wound was Gradually Appeared. The date acquired was: 05/05/2021. The wound has been in treatment 9 weeks. The wound is located on the Penis. The wound measures 0.5cm length x 1cm width x 0.1cm depth; 0.393cm^2 area and 0.039cm^3 volume. There is Fat Layer (Subcutaneous Tissue) exposed. There is no tunneling or undermining noted. There is a medium amount of serosanguineous drainage noted. There is medium (34-66%) granulation within the wound bed. There is a medium (34-66%) amount of necrotic tissue within the wound bed including Eschar and Adherent Slough. 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 There was a Excisional Skin/Subcutaneous Tissue Debridement with a total area of 9 sq cm performed by Tommie Sams.,  PA-C. With the following instrument(s): Curette to remove Viable and Non-Viable tissue/material. Material removed includes Subcutaneous Tissue, Slough, Skin: Dermis, and Skin: Epidermis after achieving pain control using Lidocaine 4% Topical Solution. No specimens were taken. A time out was conducted at 14:40, prior to the start of the procedure. A Minimum amount of bleeding was controlled with Pressure. The procedure was tolerated well with a pain level of 0 throughout and a pain level of 0 following the procedure. Post Debridement Measurements: 20cm length x 14cm width x 0.4cm depth; 87.965cm^3 volume. Character of Wound/Ulcer Post Debridement is improved. Post procedure Diagnosis Wound #1: Same as Pre-Procedure Plan Follow-up Appointments: Return Appointment in 1 week. Edema Control - Lymphedema / Segmental Compressive Device / Other: Elevate, Exercise Daily and Avoid Standing for Long Periods of Time. Elevate legs to the level of the heart and pump ankles as often as possible Elevate leg(s) parallel to the floor when sitting. WOUND #1: - Lower Leg Wound Laterality: Left, Posterior [name], [name] ([id number]) Cleanser: Byram Ancillary Kit - 15 Day Supply (Generic) 1 x Per Day/30 Days Discharge Instructions: Use supplies as instructed; Kit contains: (15) Saline Bullets; (15) 3x3 Gauze; 15 pr Gloves Primary Dressing: Kerlix AMD Roll Dressing, 4.5x 4.1 (in/in) (Generic) 1 x Per Day/30 Days Discharge Instructions: moisten with dakins Secondary Dressing: Kerlix 4.5 x 4.1 (in/yd) (Generic) 1 x Per Day/30 Days Discharge Instructions: Apply Kerlix 4.5 x 4.1 (in/yd) as instructed Secured With: 32M Medipore H Soft Cloth Surgical Tape, 2x2 (in/yd) (Generic) 1 x Per Day/30 Days WOUND #2: - Penis Wound  Laterality: Cleanser: Byram Ancillary Kit - 15 Day Supply (Generic) 1 x Per Day/30 Days Discharge Instructions: Use supplies as instructed; Kit contains: (15) Saline Bullets; (15) 3x3 Gauze; 15 pr  Gloves Primary Dressing: Gauze (Generic) 1 x Per Day/30 Days Discharge Instructions: moistened with santyl Primary Dressing: Santyl Collagenase Ointment, 30 (gm), tube 1 x Per Day/30 Days 1. Would recommend that we going continue with the wound care measures as before and the patient is in agreement with the plan. This includes the use of the Dakin's moistened gauze dressing for the leg which I believe is doing well I did debride away a small section of this today which she tolerated without complication and postdebridement I think we are doing significantly better which is great news. 2. Also can recommend that we continue with the Santyl for the glans penis location. 3. I am also can recommend patient should continue to monitor for any signs of worsening or infection if anything changes patient should let me know. We will see patient back for reevaluation in 1 week here in the clinic. If anything worsens or changes patient will contact our office for additional recommendations. Electronic Signature(s) Signed: 08/14/2021 3:02:54 PM By: Worthy Keeler PA-C Entered By: Worthy Keeler on 08/14/2021 15:02:54 [name], Carrington L. ([id number]) -------------------------------------------------------------------------------- SuperBill Details Patient Name: Mark Morale L. Date of Service: 08/14/2021 Medical Record Number: [id number] Patient Account Number: [id number] Date of Birth/Sex: 08/01/1941 (81 y.o. 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 Z99.2 Dependence on renal dialysis N18.4  Chronic kidney disease, stage 4 (severe) I25.10 Atherosclerotic heart disease of native coronary artery without angina pectoris Facility Procedures CPT4 Code: 13086578 Description: 46962 - DEB SUBQ TISSUE 20 SQ CM/< Modifier: Quantity: 1 CPT4 Code: Description: ICD-10 Diagnosis Description L97.822 Non-pressure chronic ulcer of other part of left lower leg with fat layer Modifier: exposed Quantity: Physician Procedures CPT4 Code: 9528413 Description: 24401 - WC PHYS LEVEL 4 - EST PT Modifier: 25 Quantity: 1 CPT4 Code: Description: ICD-10 Diagnosis Description 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.50 Unspecified disorder of calcium metabolism L97.822 Non-pressure chronic ulcer of other part of left lower leg with fat layer L98.492 Non-pressure chronic ulcer of skin of other sites with fat layer exposed  Z99.2 Dependence on renal dialysis Modifier: exposed Quantity: CPT4 Code: [id number] Description: 11042 - WC PHYS SUBQ TISS 20 SQ CM Modifier: Quantity: 1 CPT4 Code: Description: ICD-10 Diagnosis Description L97.822 Non-pressure chronic ulcer of other part of left lower leg with fat layer Modifier: exposed Quantity: Electronic Signature(s) Signed: 08/14/2021 3:03:31 PM By: Worthy Keeler PA-C Entered By: Worthy Keeler on 08/14/2021 15:03:31

## 2021-08-15 ENCOUNTER — Encounter: Payer: Self-pay | Admitting: Family Medicine

## 2021-08-16 ENCOUNTER — Other Ambulatory Visit: Payer: Self-pay

## 2021-08-16 ENCOUNTER — Encounter: Payer: Self-pay | Admitting: Family Medicine

## 2021-08-16 ENCOUNTER — Other Ambulatory Visit: Payer: Self-pay | Admitting: Family Medicine

## 2021-08-16 ENCOUNTER — Ambulatory Visit (INDEPENDENT_AMBULATORY_CARE_PROVIDER_SITE_OTHER): Payer: PPO

## 2021-08-16 VITALS — Ht 68.0 in | Wt 170.0 lb

## 2021-08-16 DIAGNOSIS — Z0000 Encounter for general adult medical examination without abnormal findings: Secondary | ICD-10-CM

## 2021-08-16 MED ORDER — HYDROCODONE-ACETAMINOPHEN 7.5-325 MG PO TABS: 1.0000 | ORAL_TABLET | Freq: Four times a day (QID) | ORAL | 0 refills | Status: DC | PRN

## 2021-08-16 NOTE — Progress Notes (Signed)
Subjective:   Mark L Viscuso Sr. is a 81 y.o. male who presents for Medicare Annual/Subsequent preventive examination. Virtual Visit via Telephone Note  I connected with  Mark L Heino Sr. on 08/16/21 at  2:00 PM EST by telephone and verified that I am speaking with the correct person using two identifiers.  Location: Patient: HOME Provider: [facility] Persons participating in the virtual visit: patient/Nurse Health Advisor   I discussed the limitations, risks, security and privacy concerns of performing an evaluation and management service by telephone and the availability of in person appointments. The patient expressed understanding and agreed to proceed.  Interactive audio and video telecommunications were attempted between this nurse and patient, however failed, due to patient having technical difficulties OR patient did not have access to video capability.  We continued and completed visit with audio only.  Some vital signs may be absent or patient reported.   Chriss Driver, LPN  Review of Systems     Cardiac Risk Factors include: advanced age (>50mn, >>80women);diabetes mellitus;hypertension;dyslipidemia;male gender;sedentary lifestyle;Other (see comment), Risk factor comments: Hemodialysis  PHONE VISIT. PT AT HOME. NURSE AT [facility].    Objective:    Today's Vitals   08/16/21 1356  Weight: 170 lb (77.1 kg)  Height: 5' 8"  (1.727 m)   Body mass index is 25.85 kg/m.  Advanced Directives 08/16/2021 05/10/2021 04/16/2021 04/13/2021 02/02/2021 12/25/2020 12/02/2018  Does Patient Have a Medical Advance Directive? No No No No No No No  Type of Advance Directive - - - - - - -  Would patient like information on creating a medical advance directive? No - Patient declined No - Patient declined No - Patient declined No - Patient declined No - Patient declined No - Patient declined No - Patient declined    Current Medications (verified) Outpatient Encounter Medications as of 08/16/2021   Medication Sig   aspirin EC 81 MG tablet Take 81 mg by mouth at bedtime.    calcitRIOL (ROCALTROL) 0.25 MCG capsule Take 0.25 mcg by mouth daily.   Continuous Blood Gluc Sensor (FREESTYLE LIBRE 2 SENSOR) MISC APPLY SENSOR EVERY 14 DAYS FOR GLUCOSE MONITORING   diltiazem (CARDIZEM CD) 240 MG 24 hr capsule Take 240 mg by mouth daily.   HYDROcodone-acetaminophen (NORCO) 7.5-325 MG tablet Take 1 tablet by mouth every 6 (six) hours as needed for moderate pain.   insulin aspart (NOVOLOG) 100 UNIT/ML injection Inject 8 Units into the skin 3 (three) times daily with meals. (Patient taking differently: Inject 10 Units into the skin in the morning.)   insulin glargine (LANTUS) 100 UNIT/ML injection Inject 0.12 mLs (12 Units total) into the skin at bedtime. (Patient taking differently: Inject 12 Units into the skin in the morning.)   iron sucrose in sodium chloride 0.9 % 100 mL Iron Sucrose (Venofer)   Methoxy PEG-Epoetin Beta (MIRCERA IJ) Mircera   metoprolol tartrate (LOPRESSOR) 50 MG tablet Take 1 tablet (50 mg total) by mouth 2 (two) times daily.   Pancrelipase, Lip-Prot-Amyl, (ZENPEP) 10000-32000 units CPEP Take 1 capsule by mouth 3 (three) times daily with meals.   pravastatin (PRAVACHOL) 10 MG tablet TAKE 1 TABLET(10 MG) BY MOUTH DAILY WITH BREAKFAST   sevelamer carbonate (RENVELA) 800 MG tablet Take 2 tablets (1,600 mg total) by mouth 3 (three) times daily with meals.   sodium bicarbonate 650 MG tablet Take 650 mg by mouth daily.   sodium hypochlorite (DAKIN'S 1/2 STRENGTH) external solution MOISTEN GAUZE WITH DAKINS SOLUTION THEN WRING OUT LEAVING GAUZE  DAMP NOT SATURATED BEFORE PACKING DAILY INTO THE WOUND   [DISCONTINUED] ferric gluconate 125 mg in sodium chloride 0.9 % 100 mL Inject 125 mg into the vein every Monday, Wednesday, and Friday with hemodialysis.   [DISCONTINUED] furosemide (LASIX) 40 MG tablet Take 40 mg by mouth daily.   No facility-administered encounter medications on file as of  08/16/2021.    Allergies (verified) Enalapril maleate   History: Past Medical History:  Diagnosis Date   Arthritis    lower back   Chronic back pain    Disc disease   Chronic kidney disease    Coronary atherosclerosis of native coronary artery    Coronary calcifications by chest CT, Myoview demonstrating inferolateral scar   Deafness in right ear    Diabetes mellitus    Diabetic retinopathy (HCC)    moderate nonproliferative with macular edema in right eye   Diastolic dysfunction 02/4258   Grade 1.   Essential hypertension, benign    Heart murmur    in past   HOH (hard of hearing)    Hyperkalemia    Kidney stones    Mixed hyperlipidemia    NAFLD (nonalcoholic fatty liver disease)    Pancreatic insufficiency    Diagn[facility] at [facility]   Prostate cancer [facility]) 2011   Shortness of breath dyspnea    occasional - liver pressing on right lung, decreased capacity   Subdural hematoma    Type 2 diabetes mellitus ([place])    Wears hearing aid    "crossover" form right to left   Past Surgical History:  Procedure Laterality Date   APPENDECTOMY     AV FISTULA PLACEMENT Left 05/15/2021   Procedure: INSERTION OF LEFT ARM ARTERIOVENOUS (AV) GORE-TEX GRAFT;  Surgeon: Rosetta Posner, MD;  Location: [facility];  Service: Vascular;  Laterality: Left;   Bilateral hip replacement     CATARACT EXTRACTION W/PHACO Left 05/15/2015   Procedure: CATARACT EXTRACTION PHACO AND INTRAOCULAR LENS PLACEMENT ([place]);  Surgeon: Ronnell Freshwater, MD;  Location: [place];  Service: Ophthalmology;  Laterality: Left;  DIABETIC - insulin pump and oral meds   CATARACT EXTRACTION W/PHACO Right 08/28/2015   Procedure: CATARACT EXTRACTION PHACO AND INTRAOCULAR LENS PLACEMENT (I[facility]);  Surgeon: Ronnell Freshwater, MD;  Location: [place];  Service: Ophthalmology;  Laterality: Right;  DIABETIC PER PT AND CINDY PLEASE KEEP ARRIVAL TIME AFTER 8AM    CHOLECYSTECTOMY     HERNIA REPAIR     INSERTION  OF DIALYSIS CATHETER Right 01/22/2021   Procedure: INSERTION OF DIALYSIS CATHETER;  Surgeon: Virl Cagey, MD;  Location: [facility];  Service: General;  Laterality: Right;   INSERTION OF DIALYSIS CATHETER Right 04/16/2021   Procedure: INSERTION OF DIALYSIS CATHETER;  Surgeon: Virl Cagey, MD;  Location: [facility];  Service: General;  Laterality: Right;   PROSTATECTOMY  2011   Family History  Problem Relation Age of Onset   Diabetes type II Mother    Heart attack Father    Social History   Socioeconomic History   Marital status: Married    Spouse name: Darlene   Number of children: 3   Years of education: Not on file   Highest education level: Not on file  Occupational History   Not on file  Tobacco Use   Smoking status: Never   Smokeless tobacco: Never  Vaping Use   Vaping Use: Never used  Substance and Sexual Activity   Alcohol use: No   Drug use: No   Sexual  activity: Yes  Other Topics Concern   Not on file  Social History Narrative   3 children, 1 daughter and 1 son sorry deceased. 1 daughter living in [place] and 1 step child   Married x 13 years 08/25/21.   7 grandchildren   1 great grandchild   Social Determinants of Health   Financial Resource Strain: Low Risk    Difficulty of Paying Living Expenses: Not hard at all  Food Insecurity: No Food Insecurity   Worried About Charity fundraiser in the Last Year: Never true   Arboriculturist in the Last Year: Never true  Transportation Needs: No Transportation Needs   Lack of Transportation (Medical): No   Lack of Transportation (Non-Medical): No  Physical Activity: Inactive   Days of Exercise per Week: 0 days   Minutes of Exercise per Session: 0 min  Stress: No Stress Concern Present   Feeling of Stress : Not at all  Social Connections: Moderately Isolated   Frequency of Communication with Friends and Family: More than three times a week   Frequency of Social Gatherings with Friends and Family: Three times  a week   Attends Religious Services: Never   Active Member of Clubs or Organizations: No   Attends Music therapist: Never   Marital Status: Married    Tobacco Counseling Counseling given: Not Answered   Clinical Intake:  Pre-visit preparation completed: Yes        BMI - recorded: 25.85 Nutritional Status: BMI 25 -29 Overweight Nutritional Risks: None Diabetes: Yes  How often do you need to have someone help you when you read instructions, pamphlets, or other written materials from your doctor or pharmacy?: 1 - Never  Diabetic?Nutrition Risk Assessment:  Has the patient had any N/V/D within the last 2 months?  No  Does the patient have any non-healing wounds?  Yes  currently being seen by wound care. Has the patient had any unintentional weight loss or weight gain?  No   Diabetes:  Is the patient diabetic?  Yes  If diabetic, was a CBG obtained today?  No  Did the patient bring in their glucometer from home?  No  Phone visit.  How often do you monitor your CBG's? Freestyle Libre 2. Financial Strains and Diabetes Management:  Are you having any financial strains with the device, your supplies or your medication? Yes .  Does the patient want to be seen by Chronic Care Management for management of their diabetes?  No  Would the patient like to be referred to a Nutritionist or for Diabetic Management?  No   Diabetic Exams:  Diabetic Eye Exam: Completed 10/09/2020. Pt has been advised about the importance in completing this exam.  Diabetic Foot Exam: Completed 01/07/2020. Pt has been advised about the importance in completing this exam.  Interpreter Needed?: No  Information entered by :: MJ Rayanne Padmanabhan, LPN   Activities of Daily Living In your present state of health, do you have any difficulty performing the following activities: 08/16/2021 05/10/2021  Hearing? Y Y  Comment Hearing aid to L ear, deaf in R ear. -  Vision? N Y  Comment - has macular  Difficulty  concentrating or making decisions? N N  Walking or climbing stairs? Y Y  Comment Uses cane -  Dressing or bathing? N Y  Doing errands, shopping? N -  Preparing Food and eating ? N -  Using the Toilet? N -  In the past six months, have  you accidently leaked urine? N -  Do you have problems with loss of bowel control? N -  Managing your Medications? N -  Managing your Finances? N -  Housekeeping or managing your Housekeeping? N -  Some recent data might be hidden    Patient Care Team: [name], MD as PCP - General (Family Medicine) Satira Sark, MD as PCP - Cardiology (Cardiology) Satira Sark, MD (Cardiology) Edythe Clarity, [facility] as Pharmacist (Pharmacist)  Indicate any recent Medical Services you may have received from other than [facility] providers in the past year (date may be approximate).     Assessment:   This is a routine wellness examination for [place].  Hearing/Vision screen Hearing Screening - Comments:: Pt wears hearing aid to L ear and is deaf in R.  Vision Screening - Comments:: Glasses. [facility]. 10/09/2020.  Dietary issues and exercise activities discussed: Current Exercise Habits: The patient does not participate in regular exercise at present, Exercise limited by: cardiac condition(s)   Goals Addressed             This Visit's Progress    Exercise 3x per week (30 min per time)       Increase as tolerated.        Depression Screen PHQ 2/9 Scores 08/16/2021 11/02/2020 01/07/2020 03/10/2018 04/29/2017  PHQ - 2 Score 0 0 0 0 0  PHQ- 9 Score - - 0 0 -    Fall Risk Fall Risk  08/16/2021 11/02/2020 01/07/2020 01/07/2020 06/30/2019  Falls in the past year? 1 0 1 1 0  Comment - - - - Emmi Telephone Survey: data to providers prior to load  Number falls in past yr: 0 - 1 0 -  Injury with Fall? 0 - 0 1 -  Risk for fall due to : Impaired balance/gait;Impaired mobility;History of fall(s) No Fall Risks - History of fall(s);Impaired  balance/gait;Medication side effect -  Follow up Falls prevention discussed Falls evaluation completed - Falls evaluation completed;Falls prevention discussed -    FALL RISK PREVENTION PERTAINING TO THE HOME:  Any stairs in or around the home? No  If so, are there any without handrails? No  Home free of loose throw rugs in walkways, pet beds, electrical cords, etc? Yes  Adequate lighting in your home to reduce risk of falls? Yes   ASSISTIVE DEVICES UTILIZED TO PREVENT FALLS:  Life alert? No  Use of a cane, walker or w/c? Yes  Grab bars in the bathroom? Yes  Shower chair or bench in shower? Yes  Elevated toilet seat or a handicapped toilet? Yes   TIMED UP AND GO:  Was the test performed? No .  Phone visit.  Cognitive Function:     6CIT Screen 08/16/2021  What Year? 0 points  What month? 0 points  What time? 0 points  Count back from 20 0 points  Months in reverse 0 points  Repeat phrase 0 points  Total Score 0    Immunizations Immunization History  Administered Date(s) Administered   Fluad Quad(high Dose 65+) 04/01/2019, 05/08/2021   Hepb-cpg 05/23/2021, 06/22/2021, 07/25/2021   Influenza Split 04/21/2014   Influenza,inj,Quad PF,6+ Mos 04/20/2013, 07/11/2015, 05/27/2016, 04/23/2018   Influenza-Unspecified 04/20/2013, 07/11/2015, 05/27/2016   PFIZER(Purple Top)SARS-COV-2 Vaccination 08/30/2019, 09/20/2019, 05/25/2020   Pneumococcal Conjugate-13 07/17/2015   Pneumococcal Polysaccharide-23 09/06/2010   Tdap 08/05/2010   Zoster, Live 08/08/2015    TDAP status: Due, Education has been provided regarding the importance of this vaccine. Advised may  receive this vaccine at local pharmacy or Health Dept. Aware to provide a copy of the vaccination record if obtained from local pharmacy or Health Dept. Verbalized acceptance and understanding.  Flu Vaccine status: Up to date  Pneumococcal vaccine status: Up to date  Covid-19 vaccine status: Completed vaccines  Qualifies  for Shingles Vaccine? Yes   Zostavax completed Yes   Shingrix Completed?: No.    Education has been provided regarding the importance of this vaccine. Patient has been advised to call insurance company to determine out of pocket expense if they have not yet received this vaccine. Advised may also receive vaccine at local pharmacy or Health Dept. Verbalized acceptance and understanding.  Screening Tests Health Maintenance  Topic Date Due   Zoster Vaccines- Shingrix (1 of 2) Never done   URINE MICROALBUMIN  09/11/2017   COVID-19 Vaccine (4 - Booster for Pfizer series) 07/20/2020   TETANUS/TDAP  08/05/2020   FOOT EXAM  01/06/2021   HEMOGLOBIN A1C  09/19/2021   OPHTHALMOLOGY EXAM  10/09/2021   Pneumonia Vaccine 88+ Years old  Completed   INFLUENZA VACCINE  Completed   HPV VACCINES  Aged Out    Health Maintenance  Health Maintenance Due  Topic Date Due   Zoster Vaccines- Shingrix (1 of 2) Never done   URINE MICROALBUMIN  09/11/2017   COVID-19 Vaccine (4 - Booster for Pfizer series) 07/20/2020   TETANUS/TDAP  08/05/2020   FOOT EXAM  01/06/2021    Colorectal cancer screening: No longer required.   Lung Cancer Screening: (Low Dose CT Chest recommended if Age 81-80 years, 30 pack-year currently smoking OR have quit w/in 15years.) does not qualify.    Additional Screening:  Hepatitis C Screening: does not qualify.  Vision Screening: Recommended annual ophthalmology exams for early detection of glaucoma and other disorders of the eye. Is the patient up to date with their annual eye exam?  Yes  Who is the provider or what is the name of the office in which the patient attends annual eye exams? [facility] If pt is not established with a provider, would they like to be referred to a provider to establish care? No .   Dental Screening: Recommended annual dental exams for proper oral hygiene  Community Resource Referral / Chronic Care Management: CRR required this visit?  No    CCM required this visit?  No      Plan:     I have personally reviewed and noted the following in the patients chart:   Medical and social history Use of alcohol, tobacco or illicit drugs  Current medications and supplements including opioid prescriptions. Patient is currently taking opioid prescriptions. Information provided to patient regarding non-opioid alternatives. Patient advised to discuss non-opioid treatment plan with their provider. Functional ability and status Nutritional status Physical activity Advanced directives List of other physicians Hospitalizations, surgeries, and ER visits in previous 12 months Vitals Screenings to include cognitive, depression, and falls Referrals and appointments  In addition, I have reviewed and discussed with patient certain preventive protocols, quality metrics, and best practice recommendations. A written personalized care plan for preventive services as well as general preventive health recommendations were provided to patient.     Chriss Driver, LPN   8/36/6294   Nurse Notes: Pt is up to date on all age appropriate health maintenance and vaccines. Shingrix discussed and how to obtain.

## 2021-08-16 NOTE — Progress Notes (Signed)
[name], Mark ([id number]) Visit Report for 08/14/2021 Arrival Information Details Patient Name: [name], [name]. Date of Service: 08/14/2021 2:00 PM Medical Record Number: [id number] Patient Account Number: [id number] Date of Birth/Sex: Oct 31, 1940 (81 y.o. M) Treating RN: Carlene Coria Primary Care Keymani Mclean: Jenna Luo Other Clinician: Referring Jayceion Lisenby: Jenna Luo Treating Leonte Horrigan/Extender: Skipper Cliche in Treatment: 11 Visit Information History Since Last Visit All ordered tests and consults were completed: No Patient Arrived: [name] Added or deleted any medications: No Arrival Time: 14:06 Any new allergies or adverse reactions: No Accompanied By: wife Had a fall or experienced change in No Transfer Assistance: None activities of daily living that may affect Patient Identification Verified: Yes risk of falls: Secondary Verification Process Completed: Yes Signs or symptoms of abuse/neglect since last visito No Patient Requires Transmission-Based Precautions: No Hospitalized since last visit: No Patient Has Alerts: No Implantable device outside of the clinic excluding No cellular tissue based products placed in the center since last visit: Has Dressing in Place as Prescribed: Yes Pain Present Now: Yes Electronic Signature(s) Signed: 08/15/2021 6:47:24 PM By: Carlene Coria RN Entered By: Carlene Coria on 08/14/2021 14:10:52 Martone, Damondre L. ([id number]) -------------------------------------------------------------------------------- Clinic Level of Care Assessment Details Patient Name: Mark Morale L. Date of Service: 08/14/2021 2:00 PM Medical Record Number: [id number] Patient Account Number: [id number] Date of Birth/Sex: 02/09/1941 (81 y.o. M) Treating RN: Carlene Coria Primary Care Livana Yerian: Jenna Luo Other Clinician: Referring Kordel Leavy: Jenna Luo Treating Thoma Paulsen/Extender: Skipper Cliche in Treatment: 11 Clinic Level of Care  Assessment Items TOOL 1 Quantity Score _0  - Use when EandM and Procedure is performed on INITIAL visit 0 ASSESSMENTS - Nursing Assessment / Reassessment _1  - General Physical Exam (combine w/ comprehensive assessment (listed just below) when performed on new 0 pt. evals) _2  - 0 Comprehensive Assessment (HX, ROS, Risk Assessments, Wounds Hx, etc.) ASSESSMENTS - Wound and Skin Assessment / Reassessment _3  - Dermatologic / Skin Assessment (not related to wound area) 0 ASSESSMENTS - Ostomy and/or Continence Assessment and Care _4  - Incontinence Assessment and Management 0 _5  - 0 Ostomy Care Assessment and Management (repouching, etc.) PROCESS - Coordination of Care _6  - Simple Patient / Family Education for ongoing care 0 _7  - 0 Complex (extensive) Patient / Family Education for ongoing care _8  - 0 Staff obtains Programmer, systems, Records, Test Results / Process Orders _9  - 0 Staff telephones HHA, Nursing Homes / Clarify orders / etc _10  - 0 Routine Transfer to another Facility (non-emergent condition) _11  - 0 Routine Hospital Admission (non-emergent condition) _12  - 0 New Admissions / Biomedical engineer / Ordering NPWT, Apligraf, etc. _13  - 0 Emergency Hospital Admission (emergent condition) PROCESS - Special Needs _14  - Pediatric / Minor Patient Management 0 _15  - 0 Isolation Patient Management _16  - 0 Hearing / Language / Visual special needs _17  - 0 Assessment of Community assistance (transportation, D/C planning, etc.) _18  - 0 Additional assistance / Altered mentation _19  - 0 Support Surface(s) Assessment (bed, cushion, seat, etc.) INTERVENTIONS - Miscellaneous _20  - External ear exam 0 _21  - 0 Patient Transfer (multiple staff / Civil Service fast streamer / Similar devices) _22  - 0 Simple Staple / Suture removal (25 or less) _23  - 0 Complex Staple / Suture removal (26 or more) _24  - 0 Hypo/Hyperglycemic Management (do not check if billed separately) _25  - 0 Ankle / Brachial Index (ABI) - do not  check if billed separately Has the patient been seen at the hospital within the last three years: Yes Total Score: 0 Level  Of Care: ____ [name] ([id number]) Electronic Signature(s) Signed: 08/15/2021 6:47:24 PM By: Carlene Coria RN Entered By: Carlene Coria on 08/14/2021 14:54:56 [name], [name] ([id number]) -------------------------------------------------------------------------------- Encounter Discharge Information Details Patient Name: Mark Morale L. Date of Service: 08/14/2021 2:00 PM Medical Record Number: [id number] Patient Account Number: [id number] Date of Birth/Sex: 16-Sep-1940 (81 y.o. M) Treating RN: Carlene Coria Primary Care [name]: Jenna Luo Other Clinician: Referring Joslynne Klatt: Jenna Luo Treating [name]/Extender: Skipper Cliche in Treatment: 11 Encounter Discharge Information Items Post Procedure Vitals Discharge Condition: Stable Temperature (F): 97.7 Ambulatory Status: Ambulatory Pulse (bpm): 82 Discharge Destination: Home Respiratory Rate (breaths/min): 18 Transportation: Private Auto Blood Pressure (mmHg): 135/74 Accompanied By: self Schedule Follow-up Appointment: Yes Clinical Summary of Care: Patient Declined Electronic Signature(s) Signed: 08/15/2021 6:47:24 PM By: Carlene Coria RN Entered By: Carlene Coria on 08/14/2021 14:56:08 Grussing, Friend L. ([id number]) -------------------------------------------------------------------------------- Lower Extremity Assessment Details Patient Name: Crossin, Daekwon L. Date of Service: 08/14/2021 2:00 PM Medical Record Number: [id number] Patient Account Number: [id number] Date of Birth/Sex: [birth year] (81 y.o. M) Treating RN: Carlene Coria Primary Care Jonalyn Sedlak: Jenna Luo Other Clinician: Referring Morrell Fluke: Jenna Luo Treating Hiya Point/Extender: Skipper Cliche in Treatment: 11 Edema Assessment Assessed: [Left: No] [Right: No] Edema: [Left: Ye] [Right: s] Calf Left:  Right: Point of Measurement: 34 cm From Medial Instep 34 cm Ankle Left: Right: Point of Measurement: 9 cm From Medial Instep 21 cm Vascular Assessment Pulses: Dorsalis Pedis Palpable: [Left:Yes] Electronic Signature(s) Signed: 08/15/2021 6:47:24 PM By: Carlene Coria RN Entered By: Carlene Coria on 08/14/2021 14:25:08 Veal, Gail ([id number]) -------------------------------------------------------------------------------- Multi Wound Chart Details Patient Name: Mark Morale L. Date of Service: 08/14/2021 2:00 PM Medical Record Number: [id number] Patient Account Number: [id number] Date of Birth/Sex: 05/02/1941 (81 y.o. M) Treating RN: Carlene Coria Primary Care Jeri Rawlins: Jenna Luo Other Clinician: Referring Amandamarie Feggins: Jenna Luo Treating Beecher Furio/Extender: Skipper Cliche in Treatment: 11 Vital Signs Height(in): 68 Pulse(bpm): 15 Weight(lbs): 172 Blood Pressure(mmHg): 135/74 Body Mass Index(BMI): 26 Temperature(F): 97.7 Respiratory Rate(breaths/min): 18 Photos: [N/A:N/A] Wound Location: Left, Posterior Lower Leg Penis N/A Wounding Event: Gradually Appeared Gradually Appeared N/A Primary Etiology: Calciphylaxis Calciphylaxis N/A Comorbid History: Coronary Artery Disease, Type II Coronary Artery Disease, Type II N/A Diabetes Diabetes Date Acquired: 04/05/2021 05/05/2021 N/A Weeks of Treatment: 11 9 N/A Wound Status: Open Open N/A Clustered Wound: Yes No N/A Measurements L x W x D (cm) 20x14x0.4 0.5x1x0.1 N/A Area (cm) : 219.911 0.393 N/A Volume (cm) : 87.965 0.039 N/A % Reduction in Area: -27.30% 91.70% N/A % Reduction in Volume: -27.30% 91.70% N/A Classification: Full Thickness Without Exposed Full Thickness Without Exposed N/A Support Structures Support Structures Exudate Amount: Medium Medium N/A Exudate Type: Serosanguineous Serosanguineous N/A Exudate Color: red, brown red, brown N/A Granulation Amount: Medium (34-66%) Medium (34-66%)  N/A Granulation Quality: Red, Pink N/A N/A Necrotic Amount: Medium (34-66%) Medium (34-66%) N/A Necrotic Tissue: Eschar, Adherent [place] N/A Exposed Structures: Fat Layer (Subcutaneous Tissue): Fat Layer (Subcutaneous Tissue): N/A Yes Yes Fascia: No Fascia: No Tendon: No Tendon: No Muscle: No Muscle: No Joint: No Joint: No Bone: No Bone: No Epithelialization: None None N/A Treatment Notes Electronic Signature(s) Signed: 08/15/2021 6:47:24 PM By: Carlene Coria RN Entered By: Carlene Coria on 08/14/2021 14:41:43 Barlowe, Andrzej L. ([id number]) -------------------------------------------------------------------------------- [place] Details Patient Name: Mark Morale L. Date of Service: 08/14/2021 2:00 PM Medical Record Number: [id number] Patient Account Number: [id number] Date of Birth/Sex: 10/17/1940 (81 y.o. M) Treating RN: Carlene Coria Primary Care Noor Witte: Jenna Luo Other  Clinician: Referring Abem Shaddix: Jenna Luo Treating Sylvania Moss/Extender: Skipper Cliche in Treatment: 11 Active Inactive Abuse / Safety / Falls / Self Care Management Nursing Diagnoses: Potential for injury related to falls Goals: Patient will remain injury free related to falls Date Initiated: 05/24/2021 Target Resolution Date: 08/14/2021 Goal Status: Active Interventions: Assess Activities of Daily Living upon admission and as needed Assess fall risk on admission and as needed Assess: immobility, friction, shearing, incontinence upon admission and as needed Assess impairment of mobility on admission and as needed per policy Assess personal safety and home safety (as indicated) on admission and as needed Assess self care needs on admission and as needed Notes: Wound/Skin Impairment Nursing Diagnoses: Knowledge deficit related to ulceration/compromised skin integrity Goals: Patient/caregiver will verbalize understanding of skin care  regimen Date Initiated: 05/24/2021 Target Resolution Date: 09/14/2021 Goal Status: Active Ulcer/skin breakdown will have a volume reduction of 30% by week 4 Date Initiated: 05/24/2021 Date Inactivated: 08/14/2021 Target Resolution Date: 07/24/2021 Goal Status: Unmet Unmet Reason: comorbites Ulcer/skin breakdown will have a volume reduction of 50% by week 8 Date Initiated: 05/24/2021 Target Resolution Date: 08/24/2021 Goal Status: Active Ulcer/skin breakdown will have a volume reduction of 80% by week 12 Date Initiated: 05/24/2021 Target Resolution Date: 09/24/2021 Goal Status: Active Ulcer/skin breakdown will heal within 14 weeks Date Initiated: 05/24/2021 Target Resolution Date: 10/22/2021 Goal Status: Active Interventions: Assess patient/caregiver ability to obtain necessary supplies Assess patient/caregiver ability to perform ulcer/skin care regimen upon admission and as needed Assess ulceration(s) every visit Notes: Electronic Signature(s) Signed: 08/15/2021 6:47:24 PM By: Carlene Coria RN Entered By: Carlene Coria on 08/14/2021 14:41:30 Loya, Rainen L. ([id number]) Laraia, Yaw L. ([id number]) -------------------------------------------------------------------------------- Pain Assessment Details Patient Name: [name], Tab L. Date of Service: 08/14/2021 2:00 PM Medical Record Number: [id number] Patient Account Number: [id number] Date of Birth/Sex: 1941-06-14 (81 y.o. M) Treating RN: Carlene Coria Primary Care Shamal Stracener: Jenna Luo Other Clinician: Referring Doretta Remmert: Jenna Luo Treating Hezikiah Retzloff/Extender: Skipper Cliche in Treatment: 11 Active Problems Location of Pain Severity and Description of Pain Patient Has Paino Yes Site Locations With Dressing Change: Yes Duration of the Pain. Constant / Intermittento Intermittent How Long Does it Lasto Hours: Minutes: 1 Rate the pain. Current Pain Level: 0 Worst Pain Level: 6 Least Pain Level: 0 Tolerable  Pain Level: 5 Character of Pain Describe the Pain: Sharp Pain Management and Medication Current Pain Management: Medication: Yes Cold Application: No Rest: Yes Massage: No Activity: No T.E.N.S.: No Heat Application: No Leg drop or elevation: No Is the Current Pain Management Adequate: Inadequate How does your wound impact your activities of daily livingo Sleep: No Bathing: No Appetite: No Relationship With Others: No Bladder Continence: No Emotions: No Bowel Continence: No Work: No Toileting: No Drive: No Dressing: No Hobbies: No Electronic Signature(s) Signed: 08/15/2021 6:47:24 PM By: Carlene Coria RN Entered By: Carlene Coria on 08/14/2021 14:11:55 Whack, Rashun L. ([id number]) -------------------------------------------------------------------------------- Patient/Caregiver Education Details Patient Name: Mark Morale L. Date of Service: 08/14/2021 2:00 PM Medical Record Number: [id number] Patient Account Number: [id number] Date of Birth/Gender: [birth year] (81 y.o. M) Treating RN: Carlene Coria Primary Care Physician: Jenna Luo Other Clinician: Referring Physician: Jenna Luo Treating Physician/Extender: Skipper Cliche in Treatment: 11 Education Assessment Education Provided To: Patient Education Topics Provided Wound/Skin Impairment: Methods: Explain/Verbal Responses: State content correctly Electronic Signature(s) Signed: 08/15/2021 6:47:24 PM By: Carlene Coria RN Entered By: Carlene Coria on 08/14/2021 14:55:13 Anzalone, Strummer L. ([id number]) -------------------------------------------------------------------------------- Wound Assessment Details Patient Name: Gubler, Taite L. Date of Service: 08/14/2021 2:00  PM Medical Record Number: [id number] Patient Account Number: [id number] Date of Birth/Sex: May 09, 1941 (80 y.o. M) Treating RN: Carlene Coria Primary Care Piccola Arico: Jenna Luo Other Clinician: Referring Jerelene Salaam: Jenna Luo Treating Damascus Feldpausch/Extender: Skipper Cliche in Treatment: 11 Wound Status Wound Number: 1 Primary Etiology: Calciphylaxis Wound Location: Left, Posterior Lower Leg Wound Status: Open Wounding Event: Gradually Appeared Comorbid History: Coronary Artery Disease, Type II Diabetes Date Acquired: 04/05/2021 Weeks Of Treatment: 11 Clustered Wound: Yes Photos Wound Measurements Length: (cm) 20 Width: (cm) 14 Depth: (cm) 0.4 Area: (cm) 219.911 Volume: (cm) 87.965 % Reduction in Area: -27.3% % Reduction in Volume: -27.3% Epithelialization: None Tunneling: No Undermining: No Wound Description Classification: Full Thickness Without Exposed Support Structures Exudate Amount: Medium Exudate Type: Serosanguineous Exudate Color: red, brown Foul Odor After Cleansing: No Slough/Fibrino Yes Wound Bed Granulation Amount: Medium (34-66%) Exposed Structure Granulation Quality: Red, Pink Fascia Exposed: No Necrotic Amount: Medium (34-66%) Fat Layer (Subcutaneous Tissue) Exposed: Yes Necrotic Quality: Eschar, Adherent Slough Tendon Exposed: No Muscle Exposed: No Joint Exposed: No Bone Exposed: No Treatment Notes Wound #1 (Lower Leg) Wound Laterality: Left, Posterior Cleanser Byram Ancillary Kit - 15 Day Supply Discharge Instruction: Use supplies as instructed; Kit contains: (15) Saline Bullets; (15) 3x3 Gauze; 15 pr Gloves Peri-Wound Care Wit[place], [place] ([id number]) Topical Primary Dressing Kerlix AMD Roll Dressing, 4.5x 4.1 (in/in) Discharge Instruction: moisten with dakins Secondary Dressing Kerlix 4.5 x 4.1 (in/yd) Discharge Instruction: Apply Kerlix 4.5 x 4.1 (in/yd) as instructed Secured With 44M Medipore H Soft Cloth Surgical Tape, 2x2 (in/yd) Compression Wrap Compression Stockings Add-Ons Electronic Signature(s) Signed: 08/15/2021 6:47:24 PM By: Carlene Coria RN Entered By: Carlene Coria on 08/14/2021 14:22:54 Eilts, Maleek L.  ([id number]) -------------------------------------------------------------------------------- Wound Assessment Details Patient Name: [place]oud, Chrsitopher L. Date of Service: 08/14/2021 2:00 PM Medical Record Number: [id number] Patient Account Number: [id number] Date of Birth/Sex: [birth year] (81 y.o. M) Treating RN: Carlene Coria Primary Care Cyril Woodmansee: Jenna Luo Other Clinician: Referring Rudolph Daoust: Jenna Luo Treating Jeffrey Graefe/Extender: Skipper Cliche in Treatment: 11 Wound Status Wound Number: 2 Primary Etiology: Calciphylaxis Wound Location: Penis Wound Status: Open Wounding Event: Gradually Appeared Comorbid History: Coronary Artery Disease, Type II Diabetes Date Acquired: 05/05/2021 Weeks Of Treatment: 9 Clustered Wound: No Photos Wound Measurements Length: (cm) 0.5 Width: (cm) 1 Depth: (cm) 0.1 Area: (cm) 0.393 Volume: (cm) 0.039 % Reduction in Area: 91.7% % Reduction in Volume: 91.7% Epithelialization: None Tunneling: No Undermining: No Wound Description Classification: Full Thickness Without Exposed Support Structures Exudate Amount: Medium Exudate Type: Serosanguineous Exudate Color: red, brown Foul Odor After Cleansing: No Slough/Fibrino Yes Wound Bed Granulation Amount: Medium (34-66%) Exposed Structure Necrotic Amount: Medium (34-66%) Fascia Exposed: No Necrotic Quality: Eschar, Adherent Slough Fat Layer (Subcutaneous Tissue) Exposed: Yes Tendon Exposed: No Muscle Exposed: No Joint Exposed: No Bone Exposed: No Treatment Notes Wound #2 (Penis) Cleanser Byram Ancillary Kit - 15 Day Supply Discharge Instruction: Use supplies as instructed; Kit contains: (15) Saline Bullets; (15) 3x3 Gauze; 15 pr Gloves Peri-Wound Care Henner, [place] ([id number]) Topical Primary Dressing Gauze Discharge Instruction: moistened with santyl Santyl Collagenase Ointment, 30 (gm), tube Secondary Dressing Secured With Compression Wrap Compression  Stockings Add-Ons Electronic Signature(s) Signed: 08/15/2021 6:47:24 PM By: Carlene Coria RN Entered By: Carlene Coria on 08/14/2021 14:23:16 Delisa, Azarius L. ([id number]) -------------------------------------------------------------------------------- Vitals Details Patient Name: Mark Morale L. Date of Service: 08/14/2021 2:00 PM Medical Record Number: [id number] Patient Account Number: [id number] Date of Birth/Sex: 16-Aug-1940 (81 y.o. M) Treating RN: Carlene Coria Primary Care Lydia Toren: Jenna Luo Other  Clinician: Referring Berkley Wrightsman: Jenna Luo Treating Javoni Lucken/Extender: Skipper Cliche in Treatment: 11 Vital Signs Time Taken: 14:10 Temperature (F): 97.7 Height (in): 68 Pulse (bpm): 82 Weight (lbs): 172 Respiratory Rate (breaths/min): 18 Body Mass Index (BMI): 26.1 Blood Pressure (mmHg): 135/74 Reference Range: 80 - 120 mg / dl Electronic Signature(s) Signed: 08/15/2021 6:47:24 PM By: Carlene Coria RN Entered By: Carlene Coria on 08/14/2021 14:11:10

## 2021-08-16 NOTE — Patient Instructions (Signed)
[name] , Thank you for taking time to come for your Medicare Wellness Visit. I appreciate your ongoing commitment to your health goals. Please review the following plan we discussed and let me know if I can assist you in the future.   Screening recommendations/referrals: Colonoscopy: No longer required due to age.  Recommended yearly ophthalmology/optometry visit for glaucoma screening and checkup Recommended yearly dental visit for hygiene and checkup  Vaccinations: Influenza vaccine: Done 05/08/2021 Repeat annually  Pneumococcal vaccine: Done 09/06/2010 and 07/17/2015  Tdap vaccine: Done 08/05/2010 Repeat in 10 years  Shingles vaccine: Done 08/08/2015. Shingrix discussed. Please contact your pharmacy for coverage information.     Covid-19: Done 08/30/2019, 09/20/2019, and 05/25/2020  Advanced directives: Advance directive discussed with you today. I have provided a copy for you to complete at home and have notarized. Once this is complete please bring a copy in to our office so we can scan it into your chart. Mailed out today.  Conditions/risks identified: Aim for 30 minutes of exercise each day, drink 6-8 glasses of water and eat lots of fruits and vegetables.   Next appointment: Follow up in one year for your annual wellness visit. 2024.  Preventive Care 9 Years and Older, Male  Preventive care refers to lifestyle choices and visits with your health care provider that can promote health and wellness. What does preventive care include? A yearly physical exam. This is also called an annual well check. Dental exams once or twice a year. Routine eye exams. Ask your health care provider how often you should have your eyes checked. Personal lifestyle choices, including: Daily care of your teeth and gums. Regular physical activity. Eating a healthy diet. Avoiding tobacco and drug use. Limiting alcohol use. Practicing safe sex. Taking low doses of aspirin every day. Taking vitamin  and mineral supplements as recommended by your health care provider. What happens during an annual well check? The services and screenings done by your health care provider during your annual well check will depend on your age, overall health, lifestyle risk factors, and family history of disease. Counseling  Your health care provider may ask you questions about your: Alcohol use. Tobacco use. Drug use. Emotional well-being. Home and relationship well-being. Sexual activity. Eating habits. History of falls. Memory and ability to understand (cognition). Work and work Statistician. Screening  You may have the following tests or measurements: Height, weight, and BMI. Blood pressure. Lipid and cholesterol levels. These may be checked every 5 years, or more frequently if you are over 16 years old. Skin check. Lung cancer screening. You may have this screening every year starting at age 56 if you have a 30-pack-year history of smoking and currently smoke or have quit within the past 15 years. Fecal occult blood test (FOBT) of the stool. You may have this test every year starting at age 13. Flexible sigmoidoscopy or colonoscopy. You may have a sigmoidoscopy every 5 years or a colonoscopy every 10 years starting at age 80. Prostate cancer screening. Recommendations will vary depending on your family history and other risks. Hepatitis C blood test. Hepatitis B blood test. Sexually transmitted disease (STD) testing. Diabetes screening. This is done by checking your blood sugar (glucose) after you have not eaten for a while (fasting). You may have this done every 1-3 years. Abdominal aortic aneurysm (AAA) screening. You may need this if you are a current or former smoker. Osteoporosis. You may be screened starting at age 55 if you are at high risk. Talk  with your health care provider about your test results, treatment options, and if necessary, the need for more tests. Vaccines  Your health care  provider may recommend certain vaccines, such as: Influenza vaccine. This is recommended every year. Tetanus, diphtheria, and acellular pertussis (Tdap, Td) vaccine. You may need a Td booster every 10 years. Zoster vaccine. You may need this after age 87. Pneumococcal 13-valent conjugate (PCV13) vaccine. One dose is recommended after age 20. Pneumococcal polysaccharide (PPSV23) vaccine. One dose is recommended after age 60. Talk to your health care provider about which screenings and vaccines you need and how often you need them. This information is not intended to replace advice given to you by your health care provider. Make sure you discuss any questions you have with your health care provider. Document Released: 08/18/2015 Document Revised: 04/10/2016 Document Reviewed: 05/23/2015 Elsevier Interactive Patient Education  2017 [place] Prevention in the Home Falls can cause injuries. They can happen to people of all ages. There are many things you can do to make your home safe and to help prevent falls. What can I do on the outside of my home? Regularly fix the edges of walkways and driveways and fix any cracks. Remove anything that might make you trip as you walk through a door, such as a raised step or threshold. Trim any bushes or trees on the path to your home. Use bright outdoor lighting. Clear any walking paths of anything that might make someone trip, such as rocks or tools. Regularly check to see if handrails are loose or broken. Make sure that both sides of any steps have handrails. Any raised decks and porches should have guardrails on the edges. Have any leaves, snow, or ice cleared regularly. Use sand or salt on walking paths during winter. Clean up any spills in your garage right away. This includes oil or grease spills. What can I do in the bathroom? Use night lights. Install grab bars by the toilet and in the tub and shower. Do not use towel bars as grab  bars. Use non-skid mats or decals in the tub or shower. If you need to sit down in the shower, use a plastic, non-slip stool. Keep the floor dry. Clean up any water that spills on the floor as soon as it happens. Remove soap buildup in the tub or shower regularly. Attach bath mats securely with double-sided non-slip rug tape. Do not have throw rugs and other things on the floor that can make you trip. What can I do in the bedroom? Use night lights. Make sure that you have a light by your bed that is easy to reach. Do not use any sheets or blankets that are too big for your bed. They should not hang down onto the floor. Have a firm chair that has side arms. You can use this for support while you get dressed. Do not have throw rugs and other things on the floor that can make you trip. What can I do in the kitchen? Clean up any spills right away. Avoid walking on wet floors. Keep items that you use a lot in easy-to-reach places. If you need to reach something above you, use a strong step stool that has a grab bar. Keep electrical cords out of the way. Do not use floor polish or wax that makes floors slippery. If you must use wax, use non-skid floor wax. Do not have throw rugs and other things on the floor that can make you  trip. What can I do with my stairs? Do not leave any items on the stairs. Make sure that there are handrails on both sides of the stairs and use them. Fix handrails that are broken or loose. Make sure that handrails are as long as the stairways. Check any carpeting to make sure that it is firmly attached to the stairs. Fix any carpet that is loose or worn. Avoid having throw rugs at the top or bottom of the stairs. If you do have throw rugs, attach them to the floor with carpet tape. Make sure that you have a light switch at the top of the stairs and the bottom of the stairs. If you do not have them, ask someone to add them for you. What else can I do to help prevent  falls? Wear shoes that: Do not have high heels. Have rubber bottoms. Are comfortable and fit you well. Are closed at the toe. Do not wear sandals. If you use a stepladder: Make sure that it is fully opened. Do not climb a closed stepladder. Make sure that both sides of the stepladder are locked into place. Ask someone to hold it for you, if possible. Clearly mark and make sure that you can see: Any grab bars or handrails. First and last steps. Where the edge of each step is. Use tools that help you move around (mobility aids) if they are needed. These include: Canes. Walkers. Scooters. Crutches. Turn on the lights when you go into a dark area. Replace any light bulbs as soon as they burn out. Set up your furniture so you have a clear path. Avoid moving your furniture around. If any of your floors are uneven, fix them. If there are any pets around you, be aware of where they are. Review your medicines with your doctor. Some medicines can make you feel dizzy. This can increase your chance of falling. Ask your doctor what other things that you can do to help prevent falls. This information is not intended to replace advice given to you by your health care provider. Make sure you discuss any questions you have with your health care provider. Document Released: 05/18/2009 Document Revised: 12/28/2015 Document Reviewed: 08/26/2014 Elsevier Interactive Patient Education  2017 [employer].

## 2021-08-20 DIAGNOSIS — E1122 Type 2 diabetes mellitus with diabetic chronic kidney disease: Secondary | ICD-10-CM | POA: Diagnosis not present

## 2021-08-20 DIAGNOSIS — D689 Coagulation defect, unspecified: Secondary | ICD-10-CM | POA: Diagnosis not present

## 2021-08-20 DIAGNOSIS — N2581 Secondary hyperparathyroidism of renal origin: Secondary | ICD-10-CM | POA: Diagnosis not present

## 2021-08-20 DIAGNOSIS — N186 End stage renal disease: Secondary | ICD-10-CM | POA: Diagnosis not present

## 2021-08-20 DIAGNOSIS — E039 Hypothyroidism, unspecified: Secondary | ICD-10-CM | POA: Diagnosis not present

## 2021-08-20 DIAGNOSIS — Z992 Dependence on renal dialysis: Secondary | ICD-10-CM | POA: Diagnosis not present

## 2021-08-21 ENCOUNTER — Other Ambulatory Visit: Payer: Self-pay

## 2021-08-21 DIAGNOSIS — E11622 Type 2 diabetes mellitus with other skin ulcer: Secondary | ICD-10-CM | POA: Diagnosis not present

## 2021-08-21 NOTE — Progress Notes (Signed)
MAVIN, DYKE ([id number]) Visit Report for 08/21/2021 Arrival Information Details Patient Name: [name], [name]. Date of Service: 08/21/2021 1:15 PM Medical Record Number: [id number] Patient Account Number: [id number] Date of Birth/Sex: August 01, 1941 (81 y.o. M) Treating RN: Carlene Coria Primary Care Tawna Alwin: Jenna Luo Other Clinician: Referring Sheika Coutts: Jenna Luo Treating Fitzgerald Dunne/Extender: Skipper Cliche in Treatment: 12 Visit Information History Since Last Visit All ordered tests and consults were completed: No Patient Arrived: [name] Added or deleted any medications: No Arrival Time: 13:00 Any new allergies or adverse reactions: No Accompanied By: wife Had a fall or experienced change in No Transfer Assistance: None activities of daily living that may affect Patient Identification Verified: Yes risk of falls: Secondary Verification Process Completed: Yes Signs or symptoms of abuse/neglect since last visito No Patient Requires Transmission-Based Precautions: No Hospitalized since last visit: No Patient Has Alerts: No Implantable device outside of the clinic excluding No cellular tissue based products placed in the center since last visit: Has Dressing in Place as Prescribed: Yes Pain Present Now: No Electronic Signature(s) Signed: 08/21/2021 3:42:59 PM By: Carlene Coria RN Entered By: Carlene Coria on 08/21/2021 13:18:50 [name], [name] ([id number]) -------------------------------------------------------------------------------- Clinic Level of Care Assessment Details Patient Name: Mark Morale L. Date of Service: 08/21/2021 1:15 PM Medical Record Number: [id number] Patient Account Number: [id number] Date of Birth/Sex: [birth year] (81 y.o. M) Treating RN: Carlene Coria Primary Care Litsy Epting: Jenna Luo Other Clinician: Referring Shannel Zahm: Jenna Luo Treating Mikkel Charrette/Extender: Skipper Cliche in Treatment: 12 Clinic Level of Care  Assessment Items TOOL 4 Quantity Score X - Use when only an EandM is performed on FOLLOW-UP visit 1 0 ASSESSMENTS - Nursing Assessment / Reassessment X - Reassessment of Co-morbidities (includes updates in patient status) 1 10 X- 1 5 Reassessment of Adherence to Treatment Plan ASSESSMENTS - Wound and Skin Assessment / Reassessment []  - Simple Wound Assessment / Reassessment - one wound 0 X- 2 5 Complex Wound Assessment / Reassessment - multiple wounds []  - 0 Dermatologic / Skin Assessment (not related to wound area) ASSESSMENTS - Focused Assessment []  - Circumferential Edema Measurements - multi extremities 0 []  - 0 Nutritional Assessment / Counseling / Intervention []  - 0 Lower Extremity Assessment (monofilament, tuning fork, pulses) []  - 0 Peripheral Arterial Disease Assessment (using hand held doppler) ASSESSMENTS - Ostomy and/or Continence Assessment and Care []  - Incontinence Assessment and Management 0 []  - 0 Ostomy Care Assessment and Management (repouching, etc.) PROCESS - Coordination of Care []  - Simple Patient / Family Education for ongoing care 0 []  - 0 Complex (extensive) Patient / Family Education for ongoing care []  - 0 Staff obtains Programmer, systems, Records, Test Results / Process Orders []  - 0 Staff telephones HHA, Nursing Homes / Clarify orders / etc []  - 0 Routine Transfer to another Facility (non-emergent condition) []  - 0 Routine Hospital Admission (non-emergent condition) []  - 0 New Admissions / Biomedical engineer / Ordering NPWT, Apligraf, etc. []  - 0 Emergency Hospital Admission (emergent condition) X- 1 10 Simple Discharge Coordination []  - 0 Complex (extensive) Discharge Coordination PROCESS - Special Needs []  - Pediatric / Minor Patient Management 0 []  - 0 Isolation Patient Management []  - 0 Hearing / Language / Visual special needs []  - 0 Assessment of Community assistance (transportation, D/C planning, etc.) []  - 0 Additional  assistance / Altered mentation []  - 0 Support Surface(s) Assessment (bed, cushion, seat, etc.) INTERVENTIONS - Wound Cleansing / Measurement Ardolino, Bryam L. ([id number]) []  - 0 Simple Wound Cleansing - one wound  X- 2 5 Complex Wound Cleansing - multiple wounds []  - 0 Wound Imaging (photographs - any number of wounds) []  - 0 Wound Tracing (instead of photographs) []  - 0 Simple Wound Measurement - one wound X- 2 5 Complex Wound Measurement - multiple wounds INTERVENTIONS - Wound Dressings X - Small Wound Dressing one or multiple wounds 1 10 []  - 0 Medium Wound Dressing one or multiple wounds X- 1 20 Large Wound Dressing one or multiple wounds []  - 0 Application of Medications - topical []  - 0 Application of Medications - injection INTERVENTIONS - Miscellaneous []  - External ear exam 0 []  - 0 Specimen Collection (cultures, biopsies, blood, body fluids, etc.) []  - 0 Specimen(s) / Culture(s) sent or taken to Lab for analysis []  - 0 Patient Transfer (multiple staff / Civil Service fast streamer / Similar devices) []  - 0 Simple Staple / Suture removal (25 or less) []  - 0 Complex Staple / Suture removal (26 or more) []  - 0 Hypo / Hyperglycemic Management (close monitor of Blood Glucose) []  - 0 Ankle / Brachial Index (ABI) - do not check if billed separately []  - 0 Vital Signs Has the patient been seen at the hospital within the last three years: Yes Total Score: 85 Level Of Care: New/Established - Level 3 Electronic Signature(s) Signed: 08/21/2021 3:42:59 PM By: Carlene Coria RN Entered By: Carlene Coria on 08/21/2021 14:31:59 [name], [name] ([id number]) -------------------------------------------------------------------------------- Encounter Discharge Information Details Patient Name: Mark Morale L. Date of Service: 08/21/2021 1:15 PM Medical Record Number: [id number] Patient Account Number: [id number] Date of Birth/Sex: October 02, 1940 (81 y.o. M) Treating RN: Carlene Coria Primary  Care Alecia Doi: Jenna Luo Other Clinician: Referring Gerlene Glassburn: Jenna Luo Treating Nichoel Digiulio/Extender: Skipper Cliche in Treatment: 12 Encounter Discharge Information Items Discharge Condition: Stable Ambulatory Status: Ambulatory Discharge Destination: Home Transportation: Private Auto Accompanied By: self Schedule Follow-up Appointment: Yes Clinical Summary of Care: Patient Declined Electronic Signature(s) Signed: 08/21/2021 2:31:10 PM By: Carlene Coria RN Entered By: Carlene Coria on 08/21/2021 14:31:10 [name], [name] ([id number]) -------------------------------------------------------------------------------- Wound Assessment Details Patient Name: [name], [name] Date of Service: 08/21/2021 1:15 PM Medical Record Number: [id number] Patient Account Number: [id number] Date of Birth/Sex: Dec 18, 1940 (81 y.o. M) Treating RN: Carlene Coria Primary Care Taylour Lietzke: Jenna Luo Other Clinician: Referring Ragena Fiola: Jenna Luo Treating Domingue Coltrain/Extender: Skipper Cliche in Treatment: 12 Wound Status Wound Number: 1 Primary Etiology: Calciphylaxis Wound Location: Left, Posterior Lower Leg Wound Status: Open Wounding Event: Gradually Appeared Comorbid History: Coronary Artery Disease, Type II Diabetes Date Acquired: 04/05/2021 Weeks Of Treatment: 12 Clustered Wound: Yes Wound Measurements Length: (cm) 20 Width: (cm) 14 Depth: (cm) 0.4 Area: (cm) 219.911 Volume: (cm) 87.965 % Reduction in Area: -27.3% % Reduction in Volume: -27.3% Epithelialization: None Tunneling: No Undermining: No Wound Description Classification: Full Thickness Without Exposed Support Structures Exudate Amount: Medium Exudate Type: Serosanguineous Exudate Color: red, brown Foul Odor After Cleansing: No Slough/Fibrino Yes Wound Bed Granulation Amount: Medium (34-66%) Exposed Structure Granulation Quality: Red, Pink Fascia Exposed: No Necrotic Amount: Medium (34-66%) Fat Layer  (Subcutaneous Tissue) Exposed: Yes Necrotic Quality: Eschar, Adherent Slough Tendon Exposed: No Muscle Exposed: No Joint Exposed: No Bone Exposed: No Treatment Notes Wound #1 (Lower Leg) Wound Laterality: Left, Posterior Cleanser Byram Ancillary Kit - 15 Day Supply Discharge Instruction: Use supplies as instructed; Kit contains: (15) Saline Bullets; (15) 3x3 Gauze; 15 pr Gloves Peri-Wound Care Topical Primary Dressing Kerlix AMD Roll Dressing, 4.5x 4.1 (in/in) Discharge Instruction: moisten with dakins Secondary Dressing Kerlix 4.5 x 4.1 (in/yd)  Discharge Instruction: Apply Kerlix 4.5 x 4.1 (in/yd) as instructed Secured With 13M [place] Surgical Tape, 2x2 (in/yd) Compression Wrap Compression Stockings [name], [name] ([id number]) Add-Ons Electronic Signature(s) Signed: 08/21/2021 3:42:59 PM By: Carlene Coria RN Entered By: Carlene Coria on 08/21/2021 13:19:18 [name], [name] ([id number]) -------------------------------------------------------------------------------- Wound Assessment Details Patient Name: [name], [name] Date of Service: 08/21/2021 1:15 PM Medical Record Number: [id number] Patient Account Number: [id number] Date of Birth/Sex: [birth year] (81 y.o. M) Treating RN: Carlene Coria Primary Care Apollos Tenbrink: Jenna Luo Other Clinician: Referring Fey Coghill: Jenna Luo Treating Marlis Oldaker/Extender: Skipper Cliche in Treatment: 12 Wound Status Wound Number: 2 Primary Etiology: Calciphylaxis Wound Location: Penis Wound Status: Open Wounding Event: Gradually Appeared Comorbid History: Coronary Artery Disease, Type II Diabetes Date Acquired: 05/05/2021 Weeks Of Treatment: 10 Clustered Wound: No Wound Measurements Length: (cm) 0.5 Width: (cm) 1 Depth: (cm) 0.1 Area: (cm) 0.393 Volume: (cm) 0.039 % Reduction in Area: 91.7% % Reduction in Volume: 91.7% Epithelialization: None Tunneling: No Undermining: No Wound  Description Classification: Full Thickness Without Exposed Support Structures Exudate Amount: Medium Exudate Type: Serosanguineous Exudate Color: red, brown Foul Odor After Cleansing: No Slough/Fibrino Yes Wound Bed Granulation Amount: Medium (34-66%) Exposed Structure Necrotic Amount: Medium (34-66%) Fascia Exposed: No Necrotic Quality: Eschar, Adherent Slough Fat Layer (Subcutaneous Tissue) Exposed: Yes Tendon Exposed: No Muscle Exposed: No Joint Exposed: No Bone Exposed: No Treatment Notes Wound #2 (Penis) Cleanser Byram Ancillary Kit - 15 Day Supply Discharge Instruction: Use supplies as instructed; Kit contains: (15) Saline Bullets; (15) 3x3 Gauze; 15 pr Gloves Peri-Wound Care Topical Primary Dressing Gauze Discharge Instruction: moistened with santyl Santyl Collagenase Ointment, 30 (gm), tube Secondary Dressing Secured With Compression Wrap Compression Stockings Add-Ons [name], [name] ([id number]) Electronic Signature(s) Signed: 08/21/2021 3:42:59 PM By: Carlene Coria RN Entered By: Carlene Coria on 08/21/2021 13:19:28

## 2021-08-22 NOTE — Progress Notes (Signed)
KALVIN, BUSS ([id number]) Visit Report for 08/21/2021 Physician Orders Details Patient Name: [name], [name]. Date of Service: 08/21/2021 1:15 PM Medical Record Number: [id number] Patient Account Number: [id number] Date of Birth/Sex: Dec 26, 1940 (81 y.o. M) Treating RN: Carlene Coria Primary Care Provider: Jenna Luo Other Clinician: Referring Provider: Jenna Luo Treating Provider/Extender: Skipper Cliche in Treatment: 12 Verbal / Phone Orders: No Diagnosis Coding Follow-up Appointments o Return Appointment in 1 week. Edema Control - Lymphedema / Segmental Compressive Device / Other o Elevate, Exercise Daily and Avoid Standing for Long Periods of Time. o Elevate legs to the level of the heart and pump ankles as often as possible o Elevate leg(s) parallel to the floor when sitting. Wound Treatment Wound #1 - Lower Leg Wound Laterality: Left, Posterior Cleanser: Byram Ancillary Kit - 15 Day Supply (DME) (Generic) 1 x Per Day/30 Days Discharge Instructions: Use supplies as instructed; Kit contains: (15) Saline Bullets; (15) 3x3 Gauze; 15 pr Gloves Primary Dressing: Kerlix AMD Roll Dressing, 4.5x 4.1 (in/in) (Generic) 1 x Per Day/30 Days Discharge Instructions: moisten with dakins Secondary Dressing: Kerlix 4.5 x 4.1 (in/yd) (DME) (Generic) 1 x Per Day/30 Days Discharge Instructions: Apply Kerlix 4.5 x 4.1 (in/yd) as instructed Secured With: 8M Medipore H Soft Cloth Surgical Tape, 2x2 (in/yd) (DME) (Generic) 1 x Per Day/30 Days Wound #2 - Penis Cleanser: Byram Ancillary Kit - 15 Day Supply (DME) (Generic) 1 x Per Day/30 Days Discharge Instructions: Use supplies as instructed; Kit contains: (15) Saline Bullets; (15) 3x3 Gauze; 15 pr Gloves Primary Dressing: Gauze (DME) (Generic) 1 x Per Day/30 Days Discharge Instructions: moistened with santyl Primary Dressing: Santyl Collagenase Ointment, 30 (gm), tube 1 x Per Day/30 Days Electronic Signature(s) Signed:  08/21/2021 3:42:59 PM By: Carlene Coria RN Signed: 08/22/2021 4:48:19 PM By: Worthy Keeler PA-C Entered By: Carlene Coria on 08/21/2021 13:20:39 Scott, Rorey L. ([id number]) -------------------------------------------------------------------------------- SuperBill Details Patient Name: [name], [name] Date of Service: 08/21/2021 Medical Record Number: [id number] Patient Account Number: [id number] Date of Birth/Sex: [birth year] (81 y.o. 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

## 2021-08-24 DIAGNOSIS — N454 Abscess of epididymis or testis: Secondary | ICD-10-CM | POA: Diagnosis not present

## 2021-08-27 DIAGNOSIS — N2581 Secondary hyperparathyroidism of renal origin: Secondary | ICD-10-CM | POA: Diagnosis not present

## 2021-08-27 DIAGNOSIS — Z992 Dependence on renal dialysis: Secondary | ICD-10-CM | POA: Diagnosis not present

## 2021-08-27 DIAGNOSIS — D689 Coagulation defect, unspecified: Secondary | ICD-10-CM | POA: Diagnosis not present

## 2021-08-27 DIAGNOSIS — D631 Anemia in chronic kidney disease: Secondary | ICD-10-CM | POA: Diagnosis not present

## 2021-08-27 DIAGNOSIS — N186 End stage renal disease: Secondary | ICD-10-CM | POA: Diagnosis not present

## 2021-08-27 DIAGNOSIS — E875 Hyperkalemia: Secondary | ICD-10-CM | POA: Diagnosis not present

## 2021-08-27 DIAGNOSIS — E876 Hypokalemia: Secondary | ICD-10-CM | POA: Diagnosis not present

## 2021-08-28 ENCOUNTER — Other Ambulatory Visit: Payer: Self-pay

## 2021-08-28 ENCOUNTER — Encounter: Payer: PPO | Admitting: Physician Assistant

## 2021-08-28 DIAGNOSIS — L97222 Non-pressure chronic ulcer of left calf with fat layer exposed: Secondary | ICD-10-CM | POA: Diagnosis not present

## 2021-08-28 DIAGNOSIS — E11622 Type 2 diabetes mellitus with other skin ulcer: Secondary | ICD-10-CM | POA: Diagnosis not present

## 2021-08-28 NOTE — Progress Notes (Addendum)
HANI, CAMPUSANO ([id number]) Visit Report for 08/28/2021 Chief Complaint Document Details Patient Name: [name], [name]. Date of Service: 08/28/2021 1:15 PM Medical Record Number: [id number] Patient Account Number: [id number] Date of Birth/Sex: Apr 19, 1941 (81 y.o. 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 ([id number]) -------------------------------------------------------------------------------- Debridement Details Patient Name: Mark Morale L. Date of Service: 08/28/2021 1:15 PM Medical Record Number: [id number] Patient Account Number: [id number] Date of Birth/Sex: [birth year] (81 y.o. M) Treating RN: Carlene Coria Primary Care Provider: Jenna Luo Other Clinician: Referring Provider: Jenna Luo Treating Provider/Extender: Skipper Cliche in Treatment: 13 Debridement Performed for Wound #1 Left,Posterior Lower Leg Assessment: Performed By: Physician Tommie Sams., PA-C Debridement Type: Debridement Level of Consciousness (Pre- Awake and Alert procedure): Pre-procedure Verification/Time Out Yes - 14:08 Taken: Start Time: 14:08 Total Area Debrided (L x W): 3 (cm) x 3 (cm) = 9 (cm) Tissue and other material Viable, Non-Viable, Slough, Subcutaneous, Biofilm, Slough debrided: Level: Skin/Subcutaneous Tissue Debridement Description: Excisional Instrument: Curette Bleeding: Moderate Hemostasis Achieved: Pressure End Time: 14:11 Procedural Pain: 0 Post Procedural Pain: 0 Response to Treatment: Procedure was tolerated well Level of Consciousness (Post- Awake and  Alert procedure): Post Debridement Measurements of Total Wound Length: (cm) 20 Width: (cm) 14 Depth: (cm) 0.4 Volume: (cm) 87.965 Character of Wound/Ulcer Post Debridement: Improved Post Procedure Diagnosis Same as Pre-procedure Electronic Signature(s) Signed: 08/28/2021 6:21:10 PM By: Worthy Keeler PA-C Signed: 08/29/2021 4:29:00 PM By: Carlene Coria RN Entered By: Carlene Coria on 08/28/2021 14:09:47 [name], [name] ([id number]) -------------------------------------------------------------------------------- HPI Details Patient Name: Mark Morale L. Date of Service: 08/28/2021 1:15 PM Medical Record Number: [id number] Patient Account Number: [id number] Date of Birth/Sex: 08/30/1940 (81 y.o. 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 This has been present for a couple of months currently he is on the sodium thiosulfate at dialysis. He tells me that they picked up on this quickly and have been managing it but he really has not had any help with anything else from the standpoint of progressing the wound itself. The patient does have again end-stage renal disease which currently is listed as stage IV but he is on renal dialysis. He also has coronary artery disease as well as going shortly for formal arterial studies although he did have a quick test of the tibial artery with his vascular doctor when he went in to check on his fistula for his arm and subsequently they did not feel like he had any hemodynamically unstable flow into the lower extremity this is good news. Nonetheless I think that the biggest issue here is  going to be getting some of the eschar off so that we get the wounds med headed in the appropriate direction. 05/31/2021 upon evaluation today patient appears to be doing well with regard to his leg ulceration with calciphylaxis. I am very pleased in this regard. Fortunately there does not appear to be any signs of active infection  at this time which is great news. No fevers, chills, nausea, vomiting, or diarrhea. 06/07/2021 upon evaluation today patient appears to be doing well with regard to his legs. Unfortunately he is having an issue here with his penis as well he did mention at the end of last visit. I suggested that he go see urology. However when he went to see the urologist they basically told him there was not anything they could do. They recommended that we needed to be the ones to take care of this. With that being said this is an unusual area that to be honest a lot of the traditional wound care products we use are not really good to be good for this region. Potentially Medihoney alginate could be a possibility also think Santyl could be a possibility. With that being said I think initially my suggestion is probably can be for [place] to attempt the Santyl to see if that will benefit the patient. 06/21/2021 upon evaluation today patient's wound bed actually showed signs of good granulation and epithelization at this point. Fortunately I do not see any signs of active infection systemically which is great news and his legs are doing awesome I think you are making great progress. The Santyl on the glans penis also has been of benefit this is looking tremendously better. Again the patient is pleased he also tells me the pain is significantly improved. Overall I think we are headed in the appropriate direction here. 07/05/2021 upon evaluation today patient appears to be doing well with regard to his wounds. Both the area on the tip of his penis as well as the left lateral leg is doing well. There  is some debridement this can be needed over the leg region. Fortunately I think that overall the Dakin's moistened gauze has done a great job here. No fevers, chills, nausea, vomiting, or diarrhea. 07/12/2021 upon evaluation today patient presents for reevaluation here in the clinic and overall I think that his wounds are doing significantly better which is great news. This includes both the left leg as well as the glans of the penis. Both are showing signs of significant improvement which is great and overall I think that we are headed in the right direction. I do not see any evidence of active infection at this moment. 07/19/2021 upon evaluation today patient appears to be doing better in regard to his wounds. Were getting much closer to complete resolution in regard to the necrotic tissue being removed from the leg. Overall I am extremely pleased with where we stand today. Overall I do not see any signs of active infection at this time. With regard to the patient's penis this area is showing signs of improvement as well and I am very pleased with that. 07/26/2021 upon evaluation today patient appears to be doing well with regard to his wounds. The leg in fact is looking quite well and I am very pleased with where we stand today. I do not see any signs of infection which is great and there is a lot of new granulation growth also also. With regard to the penis area this also showed signs of excellent improvement which is great news. 08/14/2020 upon evaluation today patient appears to be doing well with regard to his wounds both the leg as well as the glans penis region. Both are showing signs of improvement which is great news and overall I am extremely pleased with where we stand at this point. I think that the Dakin's moistened gauze  dressing for her leg is doing great and the Santyl is definitely helping with the glans penis location. 08/28/2021 upon evaluation today patient appears to be doing well  with regard to his wounds on the left leg as well as the penis. Fortunately both are showing signs of improvement. I am going to perform some debridement in regard to the leg there is a small area which is having trouble getting completely clean my work on that a little bit more today. Electronic Signature(s) Signed: 08/28/2021 6:01:40 PM By: Worthy Keeler PA-C Entered By: Worthy Keeler on 08/28/2021 18:01:40 [name], [name] ([id number]) -------------------------------------------------------------------------------- Physical Exam Details Patient Name: [name], [name] Date of Service: 08/28/2021 1:15 PM Medical Record Number: [id number] Patient Account Number: [id number] Date of Birth/Sex: 02-16-1941 (81 y.o. M) Treating RN: Carlene Coria Primary Care Provider: Jenna Luo Other Clinician: Referring Provider: Jenna Luo Treating Provider/Extender: Skipper Cliche in Treatment: 25 Constitutional Well-nourished and well-hydrated in no acute distress. Respiratory normal breathing without difficulty. Psychiatric this patient is able to make decisions and demonstrates good insight into disease process. Alert and Oriented x 3. pleasant and cooperative. Notes Upon inspection patient's wound bed actually showed signs of good granulation and epithelization at this point. Fortunately there does not appear to be any evidence of active infection locally nor systemically at this time which is great news. I did perform debridement over the medial portion of his left leg to clear away some of the necrotic debris here he tolerated that without complication some pain but nothing too significant. In regard to the penis location I did not perform any debridement here there are still some necrotic debris I may try to carefully clean some of this away at the next visit depending on how things appear. Electronic Signature(s) Signed: 08/28/2021 6:02:24 PM By: Worthy Keeler PA-C Entered By:  Worthy Keeler on 08/28/2021 18:02:24 [name], [name] ([id number]) -------------------------------------------------------------------------------- Physician Orders Details Patient Name: Mark Morale L. Date of Service: 08/28/2021 1:15 PM Medical Record Number: [id number] Patient Account Number: [id number] Date of Birth/Sex: 08/20/1940 (81 y.o. 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 Edema Control - Lymphedema / Segmental Compressive Device / Other o Elevate, Exercise Daily and Avoid Standing for Long Periods of Time. o Elevate legs to the level of the heart and pump ankles as often as possible o Elevate leg(s) parallel to the floor when sitting. Wound Treatment Wound #1 - Lower Leg Wound Laterality: Left, Posterior Cleanser: Byram Ancillary Kit - 15 Day Supply (Generic) 1 x Per Day/30 Days Discharge Instructions: Use supplies as instructed; Kit contains: (15) Saline Bullets; (15) 3x3 Gauze; 15 pr Gloves Primary Dressing: Kerlix AMD Roll Dressing, 4.5x 4.1 (in/in) (Generic) 1 x Per Day/30 Days Discharge Instructions: moisten with dakins Secondary Dressing: Kerlix 4.5 x 4.1 (in/yd) (Generic) 1 x Per Day/30 Days Discharge Instructions: Apply Kerlix 4.5 x 4.1 (in/yd) as instructed Secured With: 55M Medipore H Soft Cloth  Surgical Tape, 2x2 (  in/yd) (Generic) 1 x Per Day/30 Days Wound #2 - Penis Cleanser: Byram Ancillary Kit - 15 Day Supply (Generic) 1 x Per Day/30 Days Discharge Instructions: Use supplies as instructed; Kit contains: (15) Saline Bullets; (15) 3x3 Gauze; 15 pr Gloves Primary Dressing: Gauze (Generic) 1 x Per Day/30 Days Discharge Instructions: moistened with santyl Primary Dressing: Santyl Collagenase Ointment, 30 (gm), tube 1 x Per Day/30 Days Electronic Signature(s) Signed: 08/28/2021 6:21:10 PM By: Worthy Keeler PA-C Signed: 08/29/2021 4:29:00 PM By: Carlene Coria RN Entered By: Carlene Coria on 08/28/2021 14:11:48 [name], [name] ([id number]) -------------------------------------------------------------------------------- Problem List Details Patient Name: [name], [name] Date of Service: 08/28/2021 1:15 PM Medical Record Number: [id number] Patient Account Number: [id number] Date of Birth/Sex: 1940/12/14 (81 y.o. M) Treating RN: Carlene Coria Primary Care Provider: Jenna Luo Other Clinician: Referring Provider: Jenna Luo Treating Provider/Extender: Skipper Cliche in Treatment: 13 Active Problems ICD-10 Encounter Code Description Active Date MDM Diagnosis E83.50 Unspecified disorder of calcium metabolism 05/24/2021 No Yes L97.822 Non-pressure chronic ulcer of other part of left lower leg with fat layer 05/24/2021 No Yes exposed L98.492 Non-pressure chronic ulcer of skin of other sites with fat layer exposed 06/07/2021 No Yes Z99.2 Dependence on renal dialysis 05/24/2021 No Yes N18.4 Chronic kidney disease, stage 4 (severe) 05/24/2021 No Yes I25.10 Atherosclerotic heart disease of native coronary artery without angina 05/24/2021 No Yes pectoris Inactive Problems Resolved Problems Electronic Signature(s) Signed: 08/28/2021 1:39:22 PM By: Worthy Keeler PA-C Entered By: Worthy Keeler on 08/28/2021 13:39:22 [name], [name]  ([id number]) -------------------------------------------------------------------------------- Progress Note Details Patient Name: [name], [name] Date of Service: 08/28/2021 1:15 PM Medical Record Number: [id number] Patient Account Number: [id number] Date of Birth/Sex: 06-30-1941 (81 y.o. 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 This has been present for a couple of months currently he is on the sodium thiosulfate at dialysis. He tells me that they picked up on this quickly and have been managing it but he really has not had any help with anything else from the standpoint of progressing the wound itself. The patient does have again end-stage renal disease which currently is listed as stage IV but he is on renal dialysis. He also has coronary artery disease as well as going shortly for formal arterial studies although he did have a quick test of the tibial artery with his vascular doctor when he went in to check on his fistula for his arm and subsequently they did not feel like he had any hemodynamically unstable flow into the lower extremity this is good news. Nonetheless I think that the biggest issue here is going to be getting some of the eschar off so that we get the wounds med headed in the appropriate direction. 05/31/2021 upon evaluation today patient appears to be doing well with regard to his leg ulceration with calciphylaxis. I am very pleased in this regard. Fortunately there does not appear to be any signs of  active infection at this time which is great news. No fevers, chills, nausea, vomiting, or diarrhea. 06/07/2021 upon evaluation today patient appears to be doing well with regard to his legs. Unfortunately he  is having an issue here with his penis as well he did mention at the end of last visit. I suggested that he go see urology. However when he went to see the urologist they basically told him there was not anything they could do. They recommended that we needed to be the ones to take care of this. With that being said this is an unusual area that to be honest a lot of the traditional wound care products we use are not really good to be good for this region. Potentially Medihoney alginate could be a possibility also think Santyl could be a possibility. With that being said I think initially my suggestion is probably can be for [place] to attempt the Santyl to see if that will benefit the patient. 06/21/2021 upon evaluation today patient's wound bed actually showed signs of good granulation and epithelization at this point. Fortunately I do not see any signs of active infection systemically which is great news and his legs are doing awesome I think you are making great progress. The Santyl on the glans penis also has been of benefit this is looking tremendously better. Again the patient is pleased he also tells me the pain is significantly improved. Overall I think we are headed in the appropriate direction here. 07/05/2021 upon evaluation today patient appears to be doing well with regard to his wounds. Both the area on the tip of his penis as well as the left lateral leg is doing well. There is some debridement this can be needed over the leg region. Fortunately I think that overall the Dakin's moistened gauze has done a great job here. No fevers, chills, nausea, vomiting, or diarrhea. 07/12/2021 upon evaluation today patient presents for reevaluation here in the clinic and overall I think that his wounds  are doing significantly better which is great news. This includes both the left leg as well as the glans of the penis. Both are showing signs of significant improvement which is great and overall I think that we are headed in the right direction. I do not see any evidence of active infection at this moment. 07/19/2021 upon evaluation today patient appears to be doing better in regard to his wounds. Were getting much closer to complete resolution in regard to the necrotic tissue being removed from the leg. Overall I am extremely pleased with where we stand today. Overall I do not see any signs of active infection at this time. With regard to the patient's penis this area is showing signs of improvement as well and I am very pleased with that. 07/26/2021 upon evaluation today patient appears to be doing well with regard to his wounds. The leg in fact is looking quite well and I am very pleased with where we stand today. I do not see any signs of infection which is great and there is a lot of new granulation growth also also. With regard to the penis area this also showed signs of excellent improvement which is great news. 08/14/2020 upon evaluation today patient appears to be doing well with regard to his wounds both the leg as well as the glans penis region. Both are showing signs of improvement which is great news and overall I am extremely pleased with where we stand at this point. I think that the Dakin's moistened gauze dressing for her leg is doing great and the Santyl is definitely helping with the glans penis location. 08/28/2021 upon evaluation today patient appears to be doing well with regard to  his wounds on the left leg as well as the penis. Fortunately both are showing signs of improvement. I am going to perform some debridement in regard to the leg there is a small area which is having trouble getting completely clean my work on that a little bit more today. [name], [name]  ([id number]) Objective Constitutional Well-nourished and well-hydrated in no acute distress. Vitals Time Taken: 1:40 PM, Height: 68 in, Weight: 172 lbs, BMI: 26.1, Temperature: 97.8 F, Pulse: 78 bpm, Respiratory Rate: 18 breaths/min, Blood Pressure: 129/74 mmHg. Respiratory normal breathing without difficulty. Psychiatric this patient is able to make decisions and demonstrates good insight into disease process. Alert and Oriented x 3. pleasant and cooperative. General Notes: Upon inspection patient's wound bed actually showed signs of good granulation and epithelization at this point. Fortunately there does not appear to be any evidence of active infection locally nor systemically at this time which is great news. I did perform debridement over the medial portion of his left leg to clear away some of the necrotic debris here he tolerated that without complication some pain but nothing too significant. In regard to the penis location I did not perform any debridement here there are still some necrotic debris I may try to carefully clean some of this away at the next visit depending on how things appear. Integumentary (Hair, Skin) Wound #1 status is Open. Original cause of wound was Gradually Appeared. The date acquired was: 04/05/2021. The wound has been in treatment 13 weeks. The wound is located on the Left,Posterior Lower Leg. The wound measures 20cm length x 14cm width x 0.4cm depth; 219.911cm^2 area and 87.965cm^3 volume. There is Fat Layer (Subcutaneous Tissue) exposed. There is no tunneling or undermining noted. There is a medium amount of serosanguineous drainage noted. There is medium (34-66%) red, pink granulation within the wound bed. There is a medium (34-66%) amount of necrotic tissue within the wound bed including Eschar and Adherent Slough. Wound #2 status is Open. Original cause of wound was Gradually Appeared. The date acquired was: 05/05/2021. The wound has been in treatment 11  weeks. The wound is located on the Penis. The wound measures 0.5cm length x 1cm width x 0.1cm depth; 0.393cm^2 area and 0.039cm^3 volume. There is Fat Layer (Subcutaneous Tissue) exposed. There is no tunneling or undermining noted. There is a medium amount of serosanguineous drainage noted. There is medium (34-66%) granulation within the wound bed. There is a medium (34-66%) amount of necrotic tissue within the wound bed including Eschar and Adherent Slough. 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 There was a Excisional Skin/Subcutaneous Tissue Debridement with a total area of 9 sq cm performed by Tommie Sams., PA-C. With the following instrument(s): Curette to remove Viable and Non-Viable tissue/material. Material removed includes Subcutaneous Tissue, Slough, and Biofilm. No specimens were taken. A time out was conducted at 14:08, prior to the start of the procedure. A Moderate amount of bleeding was controlled with Pressure. The procedure was tolerated well with a pain level of 0 throughout and a pain level of 0 following the procedure. Post Debridement Measurements: 20cm length  x 14cm width x 0.4cm depth; 87.965cm^3 volume. Character of Wound/Ulcer Post Debridement is improved. Post procedure Diagnosis Wound #1: Same as Pre-Procedure Plan [name], [name]. ([id number]) Follow-up Appointments: Return Appointment in 1 week. Edema Control - Lymphedema / Segmental Compressive Device / Other: Elevate, Exercise Daily and Avoid Standing for  Long Periods of Time. Elevate legs to the level of the heart and pump ankles as often as possible Elevate leg(s) parallel to the floor when sitting. WOUND #1: - Lower Leg Wound Laterality: Left, Posterior Cleanser: Byram Ancillary Kit - 15 Day Supply (Generic) 1 x Per Day/30 Days Discharge Instructions: Use supplies as instructed; Kit contains: (15) Saline Bullets; (15) 3x3 Gauze; 15 pr Gloves Primary Dressing: Kerlix AMD Roll Dressing, 4.5x 4.1 (in/in) (Generic) 1 x Per Day/30 Days Discharge Instructions: moisten with dakins Secondary Dressing: Kerlix 4.5 x 4.1 (in/yd) (Generic) 1 x Per Day/30 Days Discharge Instructions: Apply Kerlix 4.5 x 4.1 (in/yd) as instructed Secured With: 59M Medipore H Soft Cloth Surgical Tape, 2x2 (in/yd) (Generic) 1 x Per Day/30 Days WOUND #2: - Penis Wound Laterality: Cleanser: Byram Ancillary Kit - 15 Day Supply (Generic) 1 x Per Day/30 Days Discharge Instructions: Use supplies as instructed; Kit contains: (15) Saline Bullets; (15) 3x3 Gauze; 15 pr Gloves Primary Dressing: Gauze (Generic) 1 x Per Day/30 Days Discharge Instructions: moistened with santyl Primary Dressing: Santyl Collagenase Ointment, 30 (gm), tube 1 x Per Day/30 Days 1. Would recommend currently that we going to continue with the wound care measures as before and the patient is in agreement plan he is using Santyl on the penis which I think is doing a good job. 2. I am also can recommend we continue with the Dakin's moistened gauze for the leg region which I think is doing an awesome job here as well. 3. I am also can recommend that we continue to monitor for any signs of worsening if anything changes he should let me know right now I really feel like he is doing quite well. We will see patient back for reevaluation in 1 week here in the clinic. If anything worsens or changes patient will contact our office for additional recommendations. Electronic Signature(s) Signed: 08/28/2021 6:03:07 PM By:  Worthy Keeler PA-C Entered By: Worthy Keeler on 08/28/2021 18:03:07 Bega, Grantley L. ([id number]) -------------------------------------------------------------------------------- SuperBill Details Patient Name: Mark Morale L. Date of Service: 08/28/2021 Medical Record Number: [id number] Patient Account Number: [id number] Date of Birth/Sex: 01/10/1941 (81 y.o. 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 with fat layer exposed Z99.2 Dependence on renal dialysis N18.4 Chronic kidney disease, stage 4 (severe) I25.10 Atherosclerotic heart disease of native coronary artery without angina pectoris Facility Procedures CPT4 Code: 94585929 Description: 24462 - DEB SUBQ TISSUE 20 SQ CM/< Modifier: Quantity: 1 CPT4 Code: Description: ICD-10 Diagnosis Description L97.822 Non-pressure chronic ulcer of other part of left lower leg with fat layer Modifier: exposed Quantity: Physician Procedures CPT4 Code: 8638177 Description: 11042 - WC PHYS SUBQ TISS 20 SQ CM Modifier: Quantity: 1 CPT4 Code: Description: ICD-10 Diagnosis Description L97.822 Non-pressure chronic ulcer of other part of left lower leg with fat layer Modifier: exposed Quantity: Electronic Signature(s) Signed: 08/28/2021 6:03:35 PM By: Worthy Keeler  PA-C Entered By: Worthy Keeler on 08/28/2021 18:03:35

## 2021-08-28 NOTE — Progress Notes (Addendum)
ABHIMANYU, CRUCES ([id number]) Visit Report for 08/28/2021 Arrival Information Details Patient Name: [name], [name]. Date of Service: 08/28/2021 1:15 PM Medical Record Number: [id number] Patient Account Number: [id number] Date of Birth/Sex: 28-Aug-1940 (81 y.o. M) Treating RN: Carlene Coria Primary Care Candice Lunney: Jenna Luo Other Clinician: Referring Siah Steely: Jenna Luo Treating Lyliana Dicenso/Extender: Skipper Cliche in Treatment: 63 Visit Information History Since Last Visit All ordered tests and consults were completed: No Patient Arrived: [name] Added or deleted any medications: No Arrival Time: 13:22 Any new allergies or adverse reactions: No Accompanied By: wife Had a fall or experienced change in No Transfer Assistance: None activities of daily living that may affect Patient Identification Verified: Yes risk of falls: Secondary Verification Process Completed: Yes Signs or symptoms of abuse/neglect since last visito No Patient Requires Transmission-Based Precautions: No Hospitalized since last visit: No Patient Has Alerts: No Implantable device outside of the clinic excluding No cellular tissue based products placed in the center since last visit: Has Dressing in Place as Prescribed: Yes Pain Present Now: No Electronic Signature(s) Signed: 08/28/2021 1:59:18 PM By: Carlene Coria RN Entered By: Carlene Coria on 08/28/2021 13:40:10 Gerwig, Daryus L. ([id number]) -------------------------------------------------------------------------------- Clinic Level of Care Assessment Details Patient Name: [name] Date of Service: 08/28/2021 1:15 PM Medical Record Number: [id number] Patient Account Number: [id number] Date of Birth/Sex: [birth year] (81 y.o. M) Treating RN: Carlene Coria Primary Care Lessie Manigo: Jenna Luo Other Clinician: Referring Trentin Knappenberger: Jenna Luo Treating Jomel Whittlesey/Extender: Skipper Cliche in Treatment: 13 Clinic Level of Care  Assessment Items TOOL 4 Quantity Score []  - Use when only an EandM is performed on FOLLOW-UP visit 0 ASSESSMENTS - Nursing Assessment / Reassessment []  - Reassessment of Co-morbidities (includes updates in patient status) 0 []  - 0 Reassessment of Adherence to Treatment Plan ASSESSMENTS - Wound and Skin Assessment / Reassessment []  - Simple Wound Assessment / Reassessment - one wound 0 []  - 0 Complex Wound Assessment / Reassessment - multiple wounds []  - 0 Dermatologic / Skin Assessment (not related to wound area) ASSESSMENTS - Focused Assessment []  - Circumferential Edema Measurements - multi extremities 0 []  - 0 Nutritional Assessment / Counseling / Intervention []  - 0 Lower Extremity Assessment (monofilament, tuning fork, pulses) []  - 0 Peripheral Arterial Disease Assessment (using hand held doppler) ASSESSMENTS - Ostomy and/or Continence Assessment and Care []  - Incontinence Assessment and Management 0 []  - 0 Ostomy Care Assessment and Management (repouching, etc.) PROCESS - Coordination of Care []  - Simple Patient / Family Education for ongoing care 0 []  - 0 Complex (extensive) Patient / Family Education for ongoing care []  - 0 Staff obtains Programmer, systems, Records, Test Results / Process Orders []  - 0 Staff telephones HHA, Nursing Homes / Clarify orders / etc []  - 0 Routine Transfer to another Facility (non-emergent condition) []  - 0 Routine Hospital Admission (non-emergent condition) []  - 0 New Admissions / Biomedical engineer / Ordering NPWT, Apligraf, etc. []  - 0 Emergency Hospital Admission (emergent condition) []  - 0 Simple Discharge Coordination []  - 0 Complex (extensive) Discharge Coordination PROCESS - Special Needs []  - Pediatric / Minor Patient Management 0 []  - 0 Isolation Patient Management []  - 0 Hearing / Language / Visual special needs []  - 0 Assessment of Community assistance (transportation, D/C planning, etc.) []  - 0 Additional assistance /  Altered mentation []  - 0 Support Surface(s) Assessment (bed, cushion, seat, etc.) INTERVENTIONS - Wound Cleansing / Measurement [place], Lavere L. ([id number]) []  - 0 Simple Wound Cleansing - one wound []  -  0 Complex Wound Cleansing - multiple wounds []  - 0 Wound Imaging (photographs - any number of wounds) []  - 0 Wound Tracing (instead of photographs) []  - 0 Simple Wound Measurement - one wound []  - 0 Complex Wound Measurement - multiple wounds INTERVENTIONS - Wound Dressings []  - Small Wound Dressing one or multiple wounds 0 []  - 0 Medium Wound Dressing one or multiple wounds []  - 0 Large Wound Dressing one or multiple wounds []  - 0 Application of Medications - topical []  - 0 Application of Medications - injection INTERVENTIONS - Miscellaneous []  - External ear exam 0 []  - 0 Specimen Collection (cultures, biopsies, blood, body fluids, etc.) []  - 0 Specimen(s) / Culture(s) sent or taken to Lab for analysis []  - 0 Patient Transfer (multiple staff / Civil Service fast streamer / Similar devices) []  - 0 Simple Staple / Suture removal (25 or less) []  - 0 Complex Staple / Suture removal (26 or more) []  - 0 Hypo / Hyperglycemic Management (close monitor of Blood Glucose) []  - 0 Ankle / Brachial Index (ABI) - do not check if billed separately []  - 0 Vital Signs Has the patient been seen at the hospital within the last three years: Yes Total Score: 0 Level Of Care: ____ Electronic Signature(s) Signed: 08/29/2021 4:29:00 PM By: Carlene Coria RN Entered By: Carlene Coria on 08/28/2021 14:19:42 Crill, Camp L. ([id number]) -------------------------------------------------------------------------------- Encounter Discharge Information Details Patient Name: [name] Date of Service: 08/28/2021 1:15 PM Medical Record Number: [id number] Patient Account Number: [id number] Date of Birth/Sex: 04/19/1941 (81 y.o. M) Treating RN: Carlene Coria Primary Care Markiesha Delia: Jenna Luo Other  Clinician: Referring Gwendelyn Lanting: Jenna Luo Treating Angeliz Settlemyre/Extender: Skipper Cliche in Treatment: 13 Encounter Discharge Information Items Post Procedure Vitals Discharge Condition: Stable Temperature (F): 97.8 Ambulatory Status: Walker Pulse (bpm): 78 Discharge Destination: Home Respiratory Rate (breaths/min): 18 Transportation: Private Auto Blood Pressure (mmHg): 129/74 Accompanied By: wife Schedule Follow-up Appointment: Yes Clinical Summary of Care: Patient Declined Electronic Signature(s) Signed: 08/29/2021 4:29:00 PM By: Carlene Coria RN Entered By: Carlene Coria on 08/28/2021 14:21:05 Sarkis, Euclide L. ([id number]) -------------------------------------------------------------------------------- Lower Extremity Assessment Details Patient Name: Speros, Gomer L. Date of Service: 08/28/2021 1:15 PM Medical Record Number: [id number] Patient Account Number: [id number] Date of Birth/Sex: 1941/05/05 (81 y.o. M) Treating RN: Carlene Coria Primary Care Perfecto Purdy: Jenna Luo Other Clinician: Referring Dominiq Fontaine: Jenna Luo Treating Alaysia Lightle/Extender: Skipper Cliche in Treatment: 13 Edema Assessment Assessed: [Left: No] Patrice Paradise: No] Edema: [Left: Ye] [Right: s] Calf Left: Right: Point of Measurement: 34 cm From Medial Instep 33 cm Ankle Left: Right: Point of Measurement: 9 cm From Medial Instep 21 cm Vascular Assessment Pulses: Dorsalis Pedis Palpable: [Left:Yes] Electronic Signature(s) Signed: 08/28/2021 1:59:18 PM By: Carlene Coria RN Entered By: Carlene Coria on 08/28/2021 13:43:30 Dingley, Adreyan L. ([id number]) -------------------------------------------------------------------------------- Multi Wound Chart Details Patient Name: [name] Date of Service: 08/28/2021 1:15 PM Medical Record Number: [id number] Patient Account Number: [id number] Date of Birth/Sex: 1941/07/31 (81 y.o. M) Treating RN: Carlene Coria Primary Care Alveda Vanhorne: Jenna Luo Other Clinician: Referring Brenen Beigel: Jenna Luo Treating Dung Salinger/Extender: Skipper Cliche in Treatment: 13 Vital Signs Height(in): 68 Pulse(bpm): 76 Weight(lbs): 172 Blood Pressure(mmHg): 129/74 Body Mass Index(BMI): 26.1 Temperature(F): 97.8 Respiratory Rate(breaths/min): 18 Photos: [N/A:N/A] Wound Location: Left, Posterior Lower Leg Penis N/A Wounding Event: Gradually Appeared Gradually Appeared N/A Primary Etiology: Calciphylaxis Calciphylaxis N/A Comorbid History: Coronary Artery Disease, Type II Coronary Artery Disease, Type II N/A Diabetes Diabetes Date Acquired: 04/05/2021 05/05/2021 N/A Weeks of Treatment: 13 11  N/A Wound Status: Open Open N/A Wound Recurrence: No No N/A Clustered Wound: Yes No N/A Measurements L x W x D (cm) 20x14x0.4 0.5x1x0.1 N/A Area (cm) : 219.911 0.393 N/A Volume (cm) : 87.965 0.039 N/A % Reduction in Area: -27.30% 91.70% N/A % Reduction in Volume: -27.30% 91.70% N/A Classification: Full Thickness Without Exposed Full Thickness Without Exposed N/A Support Structures Support Structures Exudate Amount: Medium Medium N/A Exudate Type: Serosanguineous Serosanguineous N/A Exudate Color: red, brown red, brown N/A Granulation Amount: Medium (34-66%) Medium (34-66%) N/A Granulation Quality: Red, Pink N/A N/A Necrotic Amount: Medium (34-66%) Medium (34-66%) N/A Necrotic Tissue: Eschar, Adherent [place] N/A Exposed Structures: Fat Layer (Subcutaneous Tissue): Fat Layer (Subcutaneous Tissue): N/A Yes Yes Fascia: No Fascia: No Tendon: No Tendon: No Muscle: No Muscle: No Joint: No Joint: No Bone: No Bone: No Epithelialization: None None N/A Treatment Notes Electronic Signature(s) Signed: 08/28/2021 1:46:14 PM By: Carlene Coria RN Entered By: Carlene Coria on 08/28/2021 13:46:14 Flury, Stclair L. ([id number]) Lindblad, Adger Carlean Jews  ([id number]) -------------------------------------------------------------------------------- [place] Details Patient Name: [name] Date of Service: 08/28/2021 1:15 PM Medical Record Number: [id number] Patient Account Number: [id number] Date of Birth/Sex: [birth year] (81 y.o. M) Treating RN: Carlene Coria Primary Care Dalena Plantz: Jenna Luo Other Clinician: Referring Shatoya Roets: Jenna Luo Treating Malaquias Lenker/Extender: Skipper Cliche in Treatment: 13 Active Inactive Wound/Skin Impairment Nursing Diagnoses: Knowledge deficit related to ulceration/compromised skin integrity Goals: Patient/caregiver will verbalize understanding of skin care regimen Date Initiated: 05/24/2021 Target Resolution Date: 09/14/2021 Goal Status: Active Ulcer/skin breakdown will have a volume reduction of 30% by week 4 Date Initiated: 05/24/2021 Date Inactivated: 08/14/2021 Target Resolution Date: 07/24/2021 Goal Status: Unmet Unmet Reason: comorbites Ulcer/skin breakdown will have a volume reduction of 50% by week 8 Date Initiated: 05/24/2021 Target Resolution Date: 08/24/2021 Goal Status: Active Ulcer/skin breakdown will have a volume reduction of 80% by week 12 Date Initiated: 05/24/2021 Target Resolution Date: 09/24/2021 Goal Status: Active Ulcer/skin breakdown will heal within 14 weeks Date Initiated: 05/24/2021 Target Resolution Date: 10/22/2021 Goal Status: Active Interventions: Assess patient/caregiver ability to obtain necessary supplies Assess patient/caregiver ability to perform ulcer/skin care regimen upon admission and as needed Assess ulceration(s) every visit Notes: Electronic Signature(s) Signed: 08/29/2021 4:29:00 PM By: Carlene Coria RN Previous Signature: 08/28/2021 1:45:59 PM Version By: Carlene Coria RN Entered By: Carlene Coria on 08/28/2021 14:05:57 Ledo, Izyan L.  ([id number]) -------------------------------------------------------------------------------- Pain Assessment Details Patient Name: [name], Jahiem L. Date of Service: 08/28/2021 1:15 PM Medical Record Number: [id number] Patient Account Number: [id number] Date of Birth/Sex: [birth year] (81 y.o. M) Treating RN: Carlene Coria Primary Care Aylee Littrell: Jenna Luo Other Clinician: Referring Arian Murley: Jenna Luo Treating Yitzel Shasteen/Extender: Skipper Cliche in Treatment: 13 Active Problems Location of Pain Severity and Description of Pain Patient Has Paino No Site Locations Pain Management and Medication Current Pain Management: Electronic Signature(s) Signed: 08/28/2021 1:59:18 PM By: Carlene Coria RN Entered By: Carlene Coria on 08/28/2021 13:40:47 Fallaw, Roc L. ([id number]) -------------------------------------------------------------------------------- Patient/Caregiver Education Details Patient Name: [name] Date of Service: 08/28/2021 1:15 PM Medical Record Number: [id number] Patient Account Number: [id number] Date of Birth/Gender: January 24, 1941 (81 y.o. M) Treating RN: Carlene Coria Primary Care Physician: Jenna Luo Other Clinician: Referring Physician: Jenna Luo Treating Physician/Extender: Skipper Cliche in Treatment: 13 Education Assessment Education Provided To: Patient Education Topics Provided Wound/Skin Impairment: Methods: Explain/Verbal Responses: State content correctly Electronic Signature(s) Signed: 08/29/2021 4:29:00 PM By: Carlene Coria RN Entered By: Carlene Coria on 08/28/2021 14:20:01 [place], [place]. ([id number]) -------------------------------------------------------------------------------- Wound  Assessment Details Patient Name: COZART, Holbert L. Date of Service: 08/28/2021 1:15 PM Medical Record Number: [id number] Patient Account Number: [id number] Date of Birth/Sex: 05-30-1941 (81 y.o. M) Treating RN: Carlene Coria Primary  Care Faige Seely: Jenna Luo Other Clinician: Referring Galileah Piggee: Jenna Luo Treating Norrine Ballester/Extender: Skipper Cliche in Treatment: 13 Wound Status Wound Number: 1 Primary Etiology: Calciphylaxis Wound Location: Left, Posterior Lower Leg Wound Status: Open Wounding Event: Gradually Appeared Comorbid History: Coronary Artery Disease, Type II Diabetes Date Acquired: 04/05/2021 Weeks Of Treatment: 13 Clustered Wound: Yes Photos Wound Measurements Length: (cm) 20 Width: (cm) 14 Depth: (cm) 0.4 Area: (cm) 219.911 Volume: (cm) 87.965 % Reduction in Area: -27.3% % Reduction in Volume: -27.3% Epithelialization: None Tunneling: No Undermining: No Wound Description Classification: Full Thickness Without Exposed Support Structures Exudate Amount: Medium Exudate Type: Serosanguineous Exudate Color: red, brown Foul Odor After Cleansing: No Slough/Fibrino Yes Wound Bed Granulation Amount: Medium (34-66%) Exposed Structure Granulation Quality: Red, Pink Fascia Exposed: No Necrotic Amount: Medium (34-66%) Fat Layer (Subcutaneous Tissue) Exposed: Yes Necrotic Quality: Eschar, Adherent Slough Tendon Exposed: No Muscle Exposed: No Joint Exposed: No Bone Exposed: No Treatment Notes Wound #1 (Lower Leg) Wound Laterality: Left, Posterior Cleanser Byram Ancillary Kit - 15 Day Supply Discharge Instruction: Use supplies as instructed; Kit contains: (15) Saline Bullets; (15) 3x3 Gauze; 15 pr Gloves Peri-Wound Care Mena, [place] ([id number]) Topical Primary Dressing Kerlix AMD Roll Dressing, 4.5x 4.1 (in/in) Discharge Instruction: moisten with dakins Secondary Dressing Kerlix 4.5 x 4.1 (in/yd) Discharge Instruction: Apply Kerlix 4.5 x 4.1 (in/yd) as instructed Secured With 15M Medipore H Soft Cloth Surgical Tape, 2x2 (in/yd) Compression Wrap Compression Stockings Add-Ons Electronic Signature(s) Signed: 08/28/2021 1:59:18 PM By: Carlene Coria RN Entered By: Carlene Coria on 08/28/2021 13:42:09 Whitford, Willard L. ([id number]) -------------------------------------------------------------------------------- Wound Assessment Details Patient Name: Witczak, Allyn L. Date of Service: 08/28/2021 1:15 PM Medical Record Number: [id number] Patient Account Number: [id number] Date of Birth/Sex: June 14, 1941 (81 y.o. M) Treating RN: Carlene Coria Primary Care Lakina Mcintire: Jenna Luo Other Clinician: Referring Emmamae Mcnamara: Jenna Luo Treating Alessia Gonsalez/Extender: Skipper Cliche in Treatment: 13 Wound Status Wound Number: 2 Primary Etiology: Calciphylaxis Wound Location: Penis Wound Status: Open Wounding Event: Gradually Appeared Comorbid History: Coronary Artery Disease, Type II Diabetes Date Acquired: 05/05/2021 Weeks Of Treatment: 11 Clustered Wound: No Photos Wound Measurements Length: (cm) 0.5 Width: (cm) 1 Depth: (cm) 0.1 Area: (cm) 0.393 Volume: (cm) 0.039 % Reduction in Area: 91.7% % Reduction in Volume: 91.7% Epithelialization: None Tunneling: No Undermining: No Wound Description Classification: Full Thickness Without Exposed Support Structures Exudate Amount: Medium Exudate Type: Serosanguineous Exudate Color: red, brown Foul Odor After Cleansing: No Slough/Fibrino Yes Wound Bed Granulation Amount: Medium (34-66%) Exposed Structure Necrotic Amount: Medium (34-66%) Fascia Exposed: No Necrotic Quality: Eschar, Adherent Slough Fat Layer (Subcutaneous Tissue) Exposed: Yes Tendon Exposed: No Muscle Exposed: No Joint Exposed: No Bone Exposed: No Treatment Notes Wound #2 (Penis) Cleanser Byram Ancillary Kit - 15 Day Supply Discharge Instruction: Use supplies as instructed; Kit contains: (15) Saline Bullets; (15) 3x3 Gauze; 15 pr Gloves Peri-Wound Care [place], [place] ([id number]) Topical Primary Dressing Gauze Discharge Instruction: moistened with santyl Santyl Collagenase Ointment, 30 (gm), tube Secondary Dressing Secured  With Compression Wrap Compression Stockings Add-Ons Electronic Signature(s) Signed: 08/28/2021 1:59:18 PM By: Carlene Coria RN Entered By: Carlene Coria on 08/28/2021 13:42:31 Dier, Jaydan L. ([id number]) -------------------------------------------------------------------------------- Vitals Details Patient Name: [name] Date of Service: 08/28/2021 1:15 PM Medical Record Number: [id number] Patient Account Number: [id number] Date of Birth/Sex: 09-28-1940 (80  y.o. M) Treating RN: Carlene Coria Primary Care Lue [place]: Jenna Luo Other Clinician: Referring Shaquela Weichert: Jenna Luo Treating Jaynie Hitch/Extender: Skipper Cliche in Treatment: 13 Vital Signs Time Taken: 13:40 Temperature (F): 97.8 Height (in): 68 Pulse (bpm): 78 Weight (lbs): 172 Respiratory Rate (breaths/min): 18 Body Mass Index (BMI): 26.1 Blood Pressure (mmHg): 129/74 Reference Range: 80 - 120 mg / dl Electronic Signature(s) Signed: 08/28/2021 1:59:18 PM By: Carlene Coria RN Entered By: Carlene Coria on 08/28/2021 13:40:37

## 2021-08-30 DIAGNOSIS — A419 Sepsis, unspecified organism: Secondary | ICD-10-CM | POA: Diagnosis not present

## 2021-08-30 DIAGNOSIS — G9341 Metabolic encephalopathy: Secondary | ICD-10-CM | POA: Diagnosis not present

## 2021-08-30 DIAGNOSIS — N186 End stage renal disease: Secondary | ICD-10-CM | POA: Diagnosis not present

## 2021-08-30 DIAGNOSIS — N189 Chronic kidney disease, unspecified: Secondary | ICD-10-CM | POA: Diagnosis not present

## 2021-09-03 DIAGNOSIS — Z992 Dependence on renal dialysis: Secondary | ICD-10-CM | POA: Diagnosis not present

## 2021-09-03 DIAGNOSIS — N2581 Secondary hyperparathyroidism of renal origin: Secondary | ICD-10-CM | POA: Diagnosis not present

## 2021-09-03 DIAGNOSIS — N186 End stage renal disease: Secondary | ICD-10-CM | POA: Diagnosis not present

## 2021-09-03 DIAGNOSIS — D689 Coagulation defect, unspecified: Secondary | ICD-10-CM | POA: Diagnosis not present

## 2021-09-04 ENCOUNTER — Encounter: Payer: PPO | Admitting: Physician Assistant

## 2021-09-04 ENCOUNTER — Other Ambulatory Visit: Payer: Self-pay

## 2021-09-04 DIAGNOSIS — L97222 Non-pressure chronic ulcer of left calf with fat layer exposed: Secondary | ICD-10-CM | POA: Diagnosis not present

## 2021-09-04 DIAGNOSIS — E1122 Type 2 diabetes mellitus with diabetic chronic kidney disease: Secondary | ICD-10-CM | POA: Diagnosis not present

## 2021-09-04 DIAGNOSIS — N186 End stage renal disease: Secondary | ICD-10-CM | POA: Diagnosis not present

## 2021-09-04 DIAGNOSIS — N184 Chronic kidney disease, stage 4 (severe): Secondary | ICD-10-CM | POA: Diagnosis not present

## 2021-09-04 DIAGNOSIS — Z992 Dependence on renal dialysis: Secondary | ICD-10-CM | POA: Diagnosis not present

## 2021-09-04 DIAGNOSIS — L98492 Non-pressure chronic ulcer of skin of other sites with fat layer exposed: Secondary | ICD-10-CM | POA: Diagnosis not present

## 2021-09-04 DIAGNOSIS — E11622 Type 2 diabetes mellitus with other skin ulcer: Secondary | ICD-10-CM | POA: Diagnosis not present

## 2021-09-04 NOTE — Progress Notes (Addendum)
[name] ([id number]) Visit Report for 09/04/2021 Arrival Information Details Patient Name: [name], [name]. Date of Service: 09/04/2021 1:15 PM Medical Record Number: [id number] Patient Account Number: [id number] Date of Birth/Sex: 24-Oct-1940 (81 y.o. M) Treating RN: Carlene Coria Primary Care Alaycia Eardley: Jenna Luo Other Clinician: Referring Kaydra Borgen: Jenna Luo Treating Sue Fernicola/Extender: Skipper Cliche in Treatment: 14 Visit Information History Since Last Visit All ordered tests and consults were completed: No Patient Arrived: [name] Added or deleted any medications: No Arrival Time: 13:15 Any new allergies or adverse reactions: No Accompanied By: self Had a fall or experienced change in No Transfer Assistance: None activities of daily living that may affect Patient Identification Verified: Yes risk of falls: Secondary Verification Process Completed: Yes Signs or symptoms of abuse/neglect since last visito No Patient Requires Transmission-Based Precautions: No Hospitalized since last visit: No Patient Has Alerts: No Implantable device outside of the clinic excluding No cellular tissue based products placed in the center since last visit: Has Dressing in Place as Prescribed: Yes Pain Present Now: No Electronic Signature(s) Signed: 09/05/2021 1:16:38 PM By: Carlene Coria RN Entered By: Carlene Coria on 09/04/2021 13:21:07 Agner, Jawanza L. ([id number]) -------------------------------------------------------------------------------- Clinic Level of Care Assessment Details Patient Name: Mark Morale L. Date of Service: 09/04/2021 1:15 PM Medical Record Number: [id number] Patient Account Number: [id number] Date of Birth/Sex: 20-Mar-1941 (81 y.o. M) Treating RN: Carlene Coria Primary Care Felicity Penix: Jenna Luo Other Clinician: Referring Yoona Ishii: Jenna Luo Treating Lezette Kitts/Extender: Skipper Cliche in Treatment: 14 Clinic Level of Care Assessment  Items TOOL 4 Quantity Score X - Use when only an EandM is performed on FOLLOW-UP visit 1 0 ASSESSMENTS - Nursing Assessment / Reassessment X - Reassessment of Co-morbidities (includes updates in patient status) 1 10 X- 1 5 Reassessment of Adherence to Treatment Plan ASSESSMENTS - Wound and Skin Assessment / Reassessment []  - Simple Wound Assessment / Reassessment - one wound 0 X- 2 5 Complex Wound Assessment / Reassessment - multiple wounds []  - 0 Dermatologic / Skin Assessment (not related to wound area) ASSESSMENTS - Focused Assessment []  - Circumferential Edema Measurements - multi extremities 0 []  - 0 Nutritional Assessment / Counseling / Intervention []  - 0 Lower Extremity Assessment (monofilament, tuning fork, pulses) []  - 0 Peripheral Arterial Disease Assessment (using hand held doppler) ASSESSMENTS - Ostomy and/or Continence Assessment and Care []  - Incontinence Assessment and Management 0 []  - 0 Ostomy Care Assessment and Management (repouching, etc.) PROCESS - Coordination of Care []  - Simple Patient / Family Education for ongoing care 0 X- 1 20 Complex (extensive) Patient / Family Education for ongoing care []  - 0 Staff obtains Programmer, systems, Records, Test Results / Process Orders []  - 0 Staff telephones HHA, Nursing Homes / Clarify orders / etc []  - 0 Routine Transfer to another Facility (non-emergent condition) []  - 0 Routine Hospital Admission (non-emergent condition) []  - 0 New Admissions / Biomedical engineer / Ordering NPWT, Apligraf, etc. []  - 0 Emergency Hospital Admission (emergent condition) X- 1 10 Simple Discharge Coordination []  - 0 Complex (extensive) Discharge Coordination PROCESS - Special Needs []  - Pediatric / Minor Patient Management 0 []  - 0 Isolation Patient Management []  - 0 Hearing / Language / Visual special needs []  - 0 Assessment of Community assistance (transportation, D/C planning, etc.) []  - 0 Additional assistance /  Altered mentation []  - 0 Support Surface(s) Assessment (bed, cushion, seat, etc.) INTERVENTIONS - Wound Cleansing / Measurement [name], [name] ([id number]) []  - 0 Simple Wound Cleansing - one wound  X- 2 5 Complex Wound Cleansing - multiple wounds X- 1 5 Wound Imaging (photographs - any number of wounds) []  - 0 Wound Tracing (instead of photographs) []  - 0 Simple Wound Measurement - one wound X- 2 5 Complex Wound Measurement - multiple wounds INTERVENTIONS - Wound Dressings X - Small Wound Dressing one or multiple wounds 1 10 []  - 0 Medium Wound Dressing one or multiple wounds X- 1 20 Large Wound Dressing one or multiple wounds []  - 0 Application of Medications - topical []  - 0 Application of Medications - injection INTERVENTIONS - Miscellaneous []  - External ear exam 0 []  - 0 Specimen Collection (cultures, biopsies, blood, body fluids, etc.) []  - 0 Specimen(s) / Culture(s) sent or taken to Lab for analysis []  - 0 Patient Transfer (multiple staff / Civil Service fast streamer / Similar devices) []  - 0 Simple Staple / Suture removal (25 or less) []  - 0 Complex Staple / Suture removal (26 or more) []  - 0 Hypo / Hyperglycemic Management (close monitor of Blood Glucose) []  - 0 Ankle / Brachial Index (ABI) - do not check if billed separately X- 1 5 Vital Signs Has the patient been seen at the hospital within the last three years: Yes Total Score: 115 Level Of Care: New/Established - Level 3 Electronic Signature(s) Signed: 09/05/2021 1:16:38 PM By: Carlene Coria RN Entered By: Carlene Coria on 09/04/2021 13:50:49 [name], [name] ([id number]) -------------------------------------------------------------------------------- Encounter Discharge Information Details Patient Name: Mark Morale L. Date of Service: 09/04/2021 1:15 PM Medical Record Number: [id number] Patient Account Number: [id number] Date of Birth/Sex: Dec 08, 1940 (81 y.o. M) Treating RN: Carlene Coria Primary Care  Marisa Hage: Jenna Luo Other Clinician: Referring Harjot Dibello: Jenna Luo Treating Marcellous Snarski/Extender: Skipper Cliche in Treatment: 14 Encounter Discharge Information Items Discharge Condition: Stable Ambulatory Status: Walker Discharge Destination: Home Transportation: Private Auto Accompanied By: self Schedule Follow-up Appointment: Yes Clinical Summary of Care: Patient Declined Electronic Signature(s) Signed: 09/05/2021 1:16:38 PM By: Carlene Coria RN Entered By: Carlene Coria on 09/04/2021 13:52:33 Golphin, Donterrius L. ([id number]) -------------------------------------------------------------------------------- Lower Extremity Assessment Details Patient Name: Saal, Dejour L. Date of Service: 09/04/2021 1:15 PM Medical Record Number: [id number] Patient Account Number: [id number] Date of Birth/Sex: [birth year] (81 y.o. M) Treating RN: Carlene Coria Primary Care Shaniece Bussa: Jenna Luo Other Clinician: Referring Leon Goodnow: Jenna Luo Treating Angelia Hazell/Extender: Skipper Cliche in Treatment: 14 Edema Assessment Assessed: [Left: No] [Right: No] Edema: [Left: Ye] [Right: s] Calf Left: Right: Point of Measurement: 34 cm From Medial Instep 32 cm Ankle Left: Right: Point of Measurement: 9 cm From Medial Instep 19 cm Vascular Assessment Pulses: Dorsalis Pedis Palpable: [Left:Yes] Electronic Signature(s) Signed: 09/05/2021 1:16:38 PM By: Carlene Coria RN Entered By: Carlene Coria on 09/04/2021 13:37:01 Gottwald, Wapello ([id number]) -------------------------------------------------------------------------------- Multi Wound Chart Details Patient Name: Mark Morale L. Date of Service: 09/04/2021 1:15 PM Medical Record Number: [id number] Patient Account Number: [id number] Date of Birth/Sex: 10/07/1940 (81 y.o. M) Treating RN: Carlene Coria Primary Care Dawsen Krieger: Jenna Luo Other Clinician: Referring Joyice Magda: Jenna Luo Treating Kalinda Romaniello/Extender: Skipper Cliche in Treatment: 14 Vital Signs Height(in): 68 Pulse(bpm): 80 Weight(lbs): 172 Blood Pressure(mmHg): 140/69 Body Mass Index(BMI): 26.1 Temperature(F): 98.4 Respiratory Rate(breaths/min): 18 Photos: [N/A:N/A] Wound Location: Left, Posterior Lower Leg Penis N/A Wounding Event: Gradually Appeared Gradually Appeared N/A Primary Etiology: Calciphylaxis Calciphylaxis N/A Comorbid History: Coronary Artery Disease, Type II Coronary Artery Disease, Type II N/A Diabetes Diabetes Date Acquired: 04/05/2021 05/05/2021 N/A Weeks of Treatment: 14 12 N/A Wound Status: Open Open N/A Wound Recurrence: No No N/A  Clustered Wound: Yes No N/A Measurements L x W x D (cm) 20x14x0.4 0.5x1x0.1 N/A Area (cm) : 219.911 0.393 N/A Volume (cm) : 87.965 0.039 N/A % Reduction in Area: -27.30% 91.70% N/A % Reduction in Volume: -27.30% 91.70% N/A Classification: Full Thickness Without Exposed Full Thickness Without Exposed N/A Support Structures Support Structures Exudate Amount: Medium Medium N/A Exudate Type: Serosanguineous Serosanguineous N/A Exudate Color: red, brown red, brown N/A Granulation Amount: Medium (34-66%) Medium (34-66%) N/A Granulation Quality: Red, Pink N/A N/A Necrotic Amount: Small (1-33%) Medium (34-66%) N/A Necrotic Tissue: Eschar, Adherent [place] N/A Exposed Structures: Fat Layer (Subcutaneous Tissue): Fat Layer (Subcutaneous Tissue): N/A Yes Yes Fascia: No Fascia: No Tendon: No Tendon: No Muscle: No Muscle: No Joint: No Joint: No Bone: No Bone: No Epithelialization: None None N/A Treatment Notes Electronic Signature(s) Signed: 09/05/2021 1:16:38 PM By: Carlene Coria RN Entered By: Carlene Coria on 09/04/2021 13:47:25 Wixom, Hyder L. ([id number]) Grist, Santiago Carlean Jews ([id number]) -------------------------------------------------------------------------------- [place] Details Patient Name: Mark Morale L. Date of  Service: 09/04/2021 1:15 PM Medical Record Number: [id number] Patient Account Number: [id number] Date of Birth/Sex: 07/26/1941 (81 y.o. M) Treating RN: Carlene Coria Primary Care Torrion Witter: Jenna Luo Other Clinician: Referring Davette Nugent: Jenna Luo Treating Wanda Cellucci/Extender: Skipper Cliche in Treatment: 14 Active Inactive Wound/Skin Impairment Nursing Diagnoses: Knowledge deficit related to ulceration/compromised skin integrity Goals: Patient/caregiver will verbalize understanding of skin care regimen Date Initiated: 05/24/2021 Target Resolution Date: 09/14/2021 Goal Status: Active Ulcer/skin breakdown will have a volume reduction of 30% by week 4 Date Initiated: 05/24/2021 Date Inactivated: 08/14/2021 Target Resolution Date: 07/24/2021 Goal Status: Unmet Unmet Reason: comorbites Ulcer/skin breakdown will have a volume reduction of 50% by week 8 Date Initiated: 05/24/2021 Target Resolution Date: 08/24/2021 Goal Status: Active Ulcer/skin breakdown will have a volume reduction of 80% by week 12 Date Initiated: 05/24/2021 Target Resolution Date: 09/24/2021 Goal Status: Active Ulcer/skin breakdown will heal within 14 weeks Date Initiated: 05/24/2021 Target Resolution Date: 10/22/2021 Goal Status: Active Interventions: Assess patient/caregiver ability to obtain necessary supplies Assess patient/caregiver ability to perform ulcer/skin care regimen upon admission and as needed Assess ulceration(s) every visit Notes: Electronic Signature(s) Signed: 09/05/2021 1:16:38 PM By: Carlene Coria RN Entered By: Carlene Coria on 09/04/2021 13:47:16 Bowery, Rhyse L. ([id number]) -------------------------------------------------------------------------------- Pain Assessment Details Patient Name: Mark Morale L. Date of Service: 09/04/2021 1:15 PM Medical Record Number: [id number] Patient Account Number: [id number] Date of Birth/Sex: 09/26/1940 (81 y.o. M) Treating RN: Carlene Coria Primary Care Bell Carbo: Jenna Luo Other Clinician: Referring Calisa Luckenbaugh: Jenna Luo Treating Terrilee Dudzik/Extender: Skipper Cliche in Treatment: 14 Active Problems Location of Pain Severity and Description of Pain Patient Has Paino No Site Locations Pain Management and Medication Current Pain Management: Electronic Signature(s) Signed: 09/05/2021 1:16:38 PM By: Carlene Coria RN Entered By: Carlene Coria on 09/04/2021 13:22:14 Choquette, Drayke L. ([id number]) -------------------------------------------------------------------------------- Patient/Caregiver Education Details Patient Name: Mark Morale L. Date of Service: 09/04/2021 1:15 PM Medical Record Number: [id number] Patient Account Number: [id number] Date of Birth/Gender: [birth year] (81 y.o. M) Treating RN: Carlene Coria Primary Care Physician: Jenna Luo Other Clinician: Referring Physician: Jenna Luo Treating Physician/Extender: Skipper Cliche in Treatment: 14 Education Assessment Education Provided To: Patient Education Topics Provided Wound/Skin Impairment: Methods: Explain/Verbal Responses: State content correctly Electronic Signature(s) Signed: 09/05/2021 1:16:38 PM By: Carlene Coria RN Entered By: Carlene Coria on 09/04/2021 13:51:03 Baehr, Yoni L. ([id number]) -------------------------------------------------------------------------------- Wound Assessment Details Patient Name: Leckrone, Catcher L. Date of Service: 09/04/2021 1:15 PM Medical Record Number: [id number] Patient Account Number: [id number]  Date of Birth/Sex: 1941-07-29 (81 y.o. M) Treating RN: Carlene Coria Primary Care Rosabelle Jupin: Jenna Luo Other Clinician: Referring Xochil Shanker: Jenna Luo Treating Marcey Persad/Extender: Skipper Cliche in Treatment: 14 Wound Status Wound Number: 1 Primary Etiology: Calciphylaxis Wound Location: Left, Posterior Lower Leg Wound Status: Open Wounding Event: Gradually Appeared Comorbid  History: Coronary Artery Disease, Type II Diabetes Date Acquired: 04/05/2021 Weeks Of Treatment: 14 Clustered Wound: Yes Photos Wound Measurements Length: (cm) 20 Width: (cm) 14 Depth: (cm) 0.4 Area: (cm) 219.911 Volume: (cm) 87.965 % Reduction in Area: -27.3% % Reduction in Volume: -27.3% Epithelialization: None Tunneling: No Undermining: No Wound Description Classification: Full Thickness Without Exposed Support Structures Exudate Amount: Medium Exudate Type: Serosanguineous Exudate Color: red, brown Foul Odor After Cleansing: No Slough/Fibrino Yes Wound Bed Granulation Amount: Medium (34-66%) Exposed Structure Granulation Quality: Red, Pink Fascia Exposed: No Necrotic Amount: Small (1-33%) Fat Layer (Subcutaneous Tissue) Exposed: Yes Necrotic Quality: Eschar, Adherent Slough Tendon Exposed: No Muscle Exposed: No Joint Exposed: No Bone Exposed: No Treatment Notes Wound #1 (Lower Leg) Wound Laterality: Left, Posterior Cleanser Byram Ancillary Kit - 15 Day Supply Discharge Instruction: Use supplies as instructed; Kit contains: (15) Saline Bullets; (15) 3x3 Gauze; 15 pr Gloves Peri-Wound Care Brickle, Troy ([id number]) Topical Primary Dressing Kerlix AMD Roll Dressing, 4.5x 4.1 (in/in) Discharge Instruction: moisten with dakins Secondary Dressing Kerlix 4.5 x 4.1 (in/yd) Discharge Instruction: Apply Kerlix 4.5 x 4.1 (in/yd) as instructed Secured With 63M Medipore H Soft Cloth Surgical Tape, 2x2 (in/yd) Compression Wrap Compression Stockings Add-Ons Electronic Signature(s) Signed: 09/05/2021 1:16:38 PM By: Carlene Coria RN Entered By: Carlene Coria on 09/04/2021 13:35:16 Saha, Tyjai L. ([id number]) -------------------------------------------------------------------------------- Wound Assessment Details Patient Name: Stocks, Miachel L. Date of Service: 09/04/2021 1:15 PM Medical Record Number: [id number] Patient Account Number: [id number] Date of Birth/Sex:  12-Apr-1941 (81 y.o. M) Treating RN: Carlene Coria Primary Care Maor Meckel: Jenna Luo Other Clinician: Referring Mathew Storck: Jenna Luo Treating Jacquelene Kopecky/Extender: Skipper Cliche in Treatment: 14 Wound Status Wound Number: 2 Primary Etiology: Calciphylaxis Wound Location: Penis Wound Status: Open Wounding Event: Gradually Appeared Comorbid History: Coronary Artery Disease, Type II Diabetes Date Acquired: 05/05/2021 Weeks Of Treatment: 12 Clustered Wound: No Photos Wound Measurements Length: (cm) 0.5 Width: (cm) 1 Depth: (cm) 0.1 Area: (cm) 0.393 Volume: (cm) 0.039 % Reduction in Area: 91.7% % Reduction in Volume: 91.7% Epithelialization: None Tunneling: No Undermining: No Wound Description Classification: Full Thickness Without Exposed Support Structures Exudate Amount: Medium Exudate Type: Serosanguineous Exudate Color: red, brown Foul Odor After Cleansing: No Slough/Fibrino Yes Wound Bed Granulation Amount: Medium (34-66%) Exposed Structure Necrotic Amount: Medium (34-66%) Fascia Exposed: No Necrotic Quality: Eschar, Adherent Slough Fat Layer (Subcutaneous Tissue) Exposed: Yes Tendon Exposed: No Muscle Exposed: No Joint Exposed: No Bone Exposed: No Treatment Notes Wound #2 (Penis) Cleanser Byram Ancillary Kit - 15 Day Supply Discharge Instruction: Use supplies as instructed; Kit contains: (15) Saline Bullets; (15) 3x3 Gauze; 15 pr Gloves Peri-Wound Care Raimondi, [place] ([id number]) Topical Primary Dressing Gauze Discharge Instruction: moistened with santyl Santyl Collagenase Ointment, 30 (gm), tube Secondary Dressing Secured With Compression Wrap Compression Stockings Add-Ons Electronic Signature(s) Signed: 09/05/2021 1:16:38 PM By: Carlene Coria RN Entered By: Carlene Coria on 09/04/2021 13:35:47 Gloor, Keyvin L. ([id number]) -------------------------------------------------------------------------------- Vitals Details Patient Name:  Mark Morale L. Date of Service: 09/04/2021 1:15 PM Medical Record Number: [id number] Patient Account Number: [id number] Date of Birth/Sex: [birth year] (81 y.o. M) Treating RN: Carlene Coria Primary Care Antavius Sperbeck: Jenna Luo Other Clinician: Referring Jaliyah Fotheringham: Jenna Luo Treating Thadd Apuzzo/Extender: Jeri Cos  Weeks in Treatment: 14 Vital Signs Time Taken: 13:21 Temperature (F): 98.4 Height (in): 68 Pulse (bpm): 80 Weight (lbs): 172 Respiratory Rate (breaths/min): 18 Body Mass Index (BMI): 26.1 Blood Pressure (mmHg): 140/69 Reference Range: 80 - 120 mg / dl Electronic Signature(s) Signed: 09/05/2021 1:16:38 PM By: Carlene Coria RN Entered By: Carlene Coria on 09/04/2021 13:21:31

## 2021-09-04 NOTE — Progress Notes (Addendum)
Mark, [name] ([id number]) Visit Report for 09/04/2021 Chief Complaint Document Details Patient Name: [name], [name]. Date of Service: 09/04/2021 1:15 PM Medical Record Number: [id number] Patient Account Number: [id number] Date of Birth/Sex: Feb 13, 1941 (81 y.o. M) Treating RN: Carlene Coria Primary Care Provider: Jenna Luo Other Clinician: Referring Provider: Jenna Luo Treating Provider/Extender: Skipper Cliche in Treatment: 14 Information Obtained from: Patient Chief Complaint Left LE Calciphylaxis and Glans Penis Calciphylaxis Electronic Signature(s) Signed: 09/04/2021 1:17:26 PM By: Worthy Keeler PA-C Entered By: Worthy Keeler on 09/04/2021 13:17:26 Criscuolo, Jaizon L. ([id number]) -------------------------------------------------------------------------------- HPI Details Patient Name: [name] L. Date of Service: 09/04/2021 1:15 PM Medical Record Number: [id number] Patient Account Number: [id number] Date of Birth/Sex: [birth year] (81 y.o. 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 This has been present for a couple of months currently he is on the sodium thiosulfate at dialysis. He tells me that they picked up on this quickly and have been managing it but he really has not had any help with anything else from the standpoint of progressing the wound itself. The patient does have again end-stage renal disease which currently is listed as stage IV but he is on renal dialysis. He also has coronary artery disease as well as going shortly for formal arterial studies although he did have a quick  test of the tibial artery with his vascular doctor when he went in to check on his fistula for his arm and subsequently they did not feel like he had any hemodynamically unstable flow into the lower extremity this is good news. Nonetheless I think that the biggest issue here is going to be getting some of the eschar off so that we get the wounds med headed in the appropriate direction. 05/31/2021 upon evaluation today patient appears to be doing well with regard to his leg ulceration with calciphylaxis. I am very pleased in this regard. Fortunately there does not appear to be any signs of active infection at this time which is great news. No fevers, chills, nausea, vomiting, or diarrhea. 06/07/2021 upon evaluation today patient appears to be doing well with regard to his legs. Unfortunately he is having an issue here with his penis as well he did mention at the end of last visit. I suggested that he go see urology. However when he went to see the urologist they basically told him there was not anything they could do. They recommended that we needed to be the ones to take care of this. With that being said this is an unusual area that to be honest a lot of the traditional wound care products we use are not really good to be good for this region. Potentially Medihoney alginate could be a possibility also think Santyl could be a possibility. With that being said I think initially my suggestion is probably can be for [place] to attempt the Santyl to see if that will benefit the patient. 06/21/2021 upon evaluation today patient's wound bed actually showed signs of good granulation and epithelization at this point. Fortunately I do not see any signs of active infection systemically which is great news and his legs are doing awesome I think you are making great progress. The Santyl on  the glans penis also has been of benefit this is looking tremendously better. Again the patient is pleased he also tells me the  pain is significantly improved. Overall I think we are headed in the appropriate direction here. 07/05/2021 upon evaluation today patient appears to be doing well with regard to his wounds. Both the area on the tip of his penis as well as the left lateral leg is doing well. There is some debridement this can be needed over the leg region. Fortunately I think that overall the Dakin's moistened gauze has done a great job here. No fevers, chills, nausea, vomiting, or diarrhea. 07/12/2021 upon evaluation today patient presents for reevaluation here in the clinic and overall I think that his wounds are doing significantly better which is great news. This includes both the left leg as well as the glans of the penis. Both are showing signs of significant improvement which is great and overall I think that we are headed in the right direction. I do not see any evidence of active infection at this moment. 07/19/2021 upon evaluation today patient appears to be doing better in regard to his wounds. Were getting much closer to complete resolution in regard to the necrotic tissue being removed from the leg. Overall I am extremely pleased with where we stand today. Overall I do not see any signs of active infection at this time. With regard to the patient's penis this area is showing signs of improvement as well and I am very pleased with that. 07/26/2021 upon evaluation today patient appears to be doing well with regard to his wounds. The leg in fact is looking quite well and I am very pleased with where we stand today. I do not see any signs of infection which is great and there is a lot of new granulation growth also also. With regard to the penis area this also showed signs of excellent improvement which is great news. 08/14/2020 upon evaluation today patient appears to be doing well with regard to his wounds both the leg as well as the glans penis region. Both are showing signs of improvement which is great news  and overall I am extremely pleased with where we stand at this point. I think that the Dakin's moistened gauze dressing for her leg is doing great and the Santyl is definitely helping with the glans penis location. 08/28/2021 upon evaluation today patient appears to be doing well with regard to his wounds on the left leg as well as the penis. Fortunately both are showing signs of improvement. I am going to perform some debridement in regard to the leg there is a small area which is having trouble getting completely clean my work on that a little bit more today. 09/04/2021 upon evaluation today patient appears to be doing pretty well in regard to his wounds. 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 Again he has I just found out today been off of the sodium thiosulfate for about 2 to 3 weeks. He tells me that the initial "3 weeks" was stated to be up by the nurse. With that being said I think he probably is going to need it for a bit longer to be honest. We discussed that today. Over the penis area the Santyl does seem to be loose and a lot of this up and that is good hopefully will be able  to get this completely cleared off it sometime shortly. Electronic Signature(s) Signed: 09/04/2021 1:55:45 PM By: Worthy Keeler PA-C Entered By: Worthy Keeler on 09/04/2021 13:55:45 Dobransky, [name] ([id number]) -------------------------------------------------------------------------------- Physical Exam Details Patient Name: Gaal, Zi L. Date of Service: 09/04/2021 1:15 PM Medical Record Number: [id number] Patient Account Number: [id number] Date of Birth/Sex: June 10, 1941 (81 y.o. M) Treating RN: Carlene Coria Primary Care Provider: Jenna Luo Other Clinician: Referring Provider: Jenna Luo Treating Provider/Extender: Skipper Cliche in Treatment: 56 Constitutional Well-nourished and  well-hydrated in no acute distress. Respiratory normal breathing without difficulty. Psychiatric this patient is able to make decisions and demonstrates good insight into disease process. Alert and Oriented x 3. pleasant and cooperative. Notes Upon inspection patient's wound in regard to the penis again does still have necrotic tissue on the surface of the wound were using Santyl here. In regard to the wound on the leg we have been using the Dakin's moistened gauze dressing which helps to clean this away that is doing a good job except for on the medial aspect the leg where I feel like this is actually starting to try to spread. This could be secondary to the cessation of the sodium thiosulfate. Electronic Signature(s) Signed: 09/04/2021 1:56:19 PM By: Worthy Keeler PA-C Entered By: Worthy Keeler on 09/04/2021 13:56:19 Ekblad, [name] ([id number]) -------------------------------------------------------------------------------- Physician Orders Details Patient Name: [name] L. Date of Service: 09/04/2021 1:15 PM Medical Record Number: [id number] Patient Account Number: [id number] Date of Birth/Sex: May 29, 1941 (81 y.o. M) Treating RN: Carlene Coria Primary Care Provider: Jenna Luo Other Clinician: Referring Provider: Jenna Luo Treating Provider/Extender: Skipper Cliche in Treatment: 64 Verbal / Phone Orders: No Diagnosis Coding ICD-10 Coding Code Description E83.50 Unspecified disorder of calcium metabolism L97.822 Non-pressure chronic ulcer of other part of left lower leg with fat layer exposed L98.492 Non-pressure chronic ulcer of skin of other sites with fat layer exposed Z99.2 Dependence on renal dialysis N18.4 Chronic kidney disease, stage 4 (severe) I25.10 Atherosclerotic heart disease of native coronary artery without angina pectoris Follow-up Appointments o Return Appointment in 1 week. Edema Control - Lymphedema / Segmental Compressive Device /  Other o Elevate, Exercise Daily and Avoid Standing for Long Periods of Time. o Elevate legs to the level of the heart and pump ankles as often as possible o Elevate leg(s) parallel to the floor when sitting. Wound Treatment Wound #1 - Lower Leg Wound Laterality: Left, Posterior Cleanser: Byram Ancillary Kit - 15 Day Supply (Generic) 1 x Per Day/30 Days Discharge Instructions: Use supplies as instructed; Kit contains: (15) Saline Bullets; (15) 3x3 Gauze; 15 pr Gloves Primary Dressing: Kerlix AMD Roll Dressing, 4.5x 4.1 (in/in) (Generic) 1 x Per Day/30 Days Discharge Instructions: moisten with dakins Secondary Dressing: Kerlix 4.5 x 4.1 (in/yd) (Generic) 1 x Per Day/30 Days Discharge Instructions: Apply Kerlix 4.5 x 4.1 (in/yd) as instructed Secured With: 1M Medipore H Soft Cloth Surgical Tape, 2x2 (in/yd) (Generic) 1 x Per Day/30 Days Wound #2 - Penis Cleanser: Byram Ancillary Kit - 15 Day Supply (Generic) 1 x Per Day/30 Days Discharge Instructions: Use supplies as instructed; Kit contains: (15) Saline Bullets; (15) 3x3 Gauze; 15 pr Gloves Primary Dressing: Gauze (Generic) 1 x Per Day/30 Days Discharge Instructions: moistened with santyl Primary Dressing: Santyl Collagenase Ointment, 30 (gm), tube 1 x Per Day/30 Days Electronic Signature(s) Signed: 09/04/2021 5:15:34 PM By: Worthy Keeler PA-C Signed: 09/05/2021 1:16:38 PM By: Carlene Coria RN Entered By: Carlene Coria on 09/04/2021 13:49:06 Demeo, Ankit  L. ([id number]) -------------------------------------------------------------------------------- Problem List Details Patient Name: Stogner, Jamerson L. Date of Service: 09/04/2021 1:15 PM Medical Record Number: [id number] Patient Account Number: [id number] Date of Birth/Sex: Dec 30, 1940 (81 y.o. 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 ([id number]) -------------------------------------------------------------------------------- Progress Note Details Patient Name: Palomarez, Nirvan L. Date of Service: 09/04/2021 1:15 PM Medical Record Number: [id number] Patient Account Number: [id number] Date of Birth/Sex: [birth year] (81 y.o. 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 This has been present for a couple of months currently he is on the sodium thiosulfate at dialysis. He tells me that they picked up on this quickly and have been managing it but he really has not had any help with anything else from the standpoint of progressing the wound itself. The patient does have again end-stage renal disease which currently is  listed as stage IV but he is on renal dialysis. He also has coronary artery disease as well as going shortly for formal arterial studies although he did have a quick test of the tibial artery with his vascular doctor when he went in to check on his fistula for his arm and subsequently they did not feel like he had any hemodynamically unstable flow into the lower extremity this is good news. Nonetheless I think that the biggest issue here is going to be getting some of the eschar off so that we get the wounds med headed in the appropriate direction. 05/31/2021 upon evaluation today patient appears to be doing well with regard to his leg ulceration with calciphylaxis. I am very pleased in this regard. Fortunately there does not appear to be any signs of active infection at this time which is great news. No fevers, chills, nausea, vomiting, or diarrhea. 06/07/2021 upon evaluation today patient appears to be doing well with regard to his legs. Unfortunately he is having an issue here with his penis as well he did mention at the end of last visit. I suggested that he go see urology. However when he went to see the urologist they basically told him there was not anything they could do. They recommended that we needed to be the ones to take care of this. With that being said this is an unusual area that to be honest a lot of the traditional wound care products we use are not really good to be good for this region. Potentially Medihoney alginate could be  a possibility also think Santyl could be a possibility. With that being said I think initially my suggestion is probably can be for [place] to attempt the Santyl to see if that will benefit the patient. 06/21/2021 upon evaluation today patient's wound bed actually showed signs of good granulation and epithelization at this point. Fortunately I do not see any signs of active infection systemically which is great news and his legs are doing awesome I think you are  making great progress. The Santyl on the glans penis also has been of benefit this is looking tremendously better. Again the patient is pleased he also tells me the pain is significantly improved. Overall I think we are headed in the appropriate direction here. 07/05/2021 upon evaluation today patient appears to be doing well with regard to his wounds. Both the area on the tip of his penis as well as the left lateral leg is doing well. There is some debridement this can be needed over the leg region. Fortunately I think that overall the Dakin's moistened gauze has done a great job here. No fevers, chills, nausea, vomiting, or diarrhea. 07/12/2021 upon evaluation today patient presents for reevaluation here in the clinic and overall I think that his wounds are doing significantly better which is great news. This includes both the left leg as well as the glans of the penis. Both are showing signs of significant improvement which is great and overall I think that we are headed in the right direction. I do not see any evidence of active infection at this moment. 07/19/2021 upon evaluation today patient appears to be doing better in regard to his wounds. Were getting much closer to complete resolution in regard to the necrotic tissue being removed from the leg. Overall I am extremely pleased with where we stand today. Overall I do not see any signs of active infection at this time. With regard to the patient's penis this area is showing signs of improvement as well and I am very pleased with that. 07/26/2021 upon evaluation today patient appears to be doing well with regard to his wounds. The leg in fact is looking quite well and I am very pleased with where we stand today. I do not see any signs of infection which is great and there is a lot of new granulation growth also also. With regard to the penis area this also showed signs of excellent improvement which is great news. 08/14/2020 upon evaluation today  patient appears to be doing well with regard to his wounds both the leg as well as the glans penis region. Both are showing signs of improvement which is great news and overall I am extremely pleased with where we stand at this point. I think that the Dakin's moistened gauze dressing for her leg is doing great and the Santyl is definitely helping with the glans penis location. 08/28/2021 upon evaluation today patient appears to be doing well with regard to his wounds on the left leg as well as the penis. Fortunately both are showing signs of improvement. I am going to perform some debridement in regard to the leg there is a small area which is having trouble getting completely clean my work on that a little bit more today. 09/04/2021 upon evaluation today patient appears to be doing pretty well in regard to his wounds. 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  Again he has I just found out today been off of the sodium thiosulfate for about 2 to 3 weeks. He tells me that the initial "3 weeks" was stated to be up by the nurse. With that being said I think he probably is going to need it for a bit longer to be honest. We discussed that today. Over the penis area the Santyl does seem to be loose and a lot of this up and that is good hopefully will be able to get this completely cleared off it sometime shortly. Parkhill, Nathon L. ([id number]) Objective Constitutional Well-nourished and well-hydrated in no acute distress. Vitals Time Taken: 1:21 PM, Height: 68 in, Weight: 172 lbs, BMI: 26.1, Temperature: 98.4 F, Pulse: 80 bpm, Respiratory Rate: 18 breaths/min, Blood Pressure: 140/69 mmHg. Respiratory normal breathing without difficulty. Psychiatric this patient is able to make decisions and demonstrates good insight into disease process. Alert and Oriented x 3. pleasant and cooperative. General Notes:  Upon inspection patient's wound in regard to the penis again does still have necrotic tissue on the surface of the wound were using Santyl here. In regard to the wound on the leg we have been using the Dakin's moistened gauze dressing which helps to clean this away that is doing a good job except for on the medial aspect the leg where I feel like this is actually starting to try to spread. This could be secondary to the cessation of the sodium thiosulfate. Integumentary (Hair, Skin) Wound #1 status is Open. Original cause of wound was Gradually Appeared. The date acquired was: 04/05/2021. The wound has been in treatment 14 weeks. The wound is located on the Left,Posterior Lower Leg. The wound measures 20cm length x 14cm width x 0.4cm depth; 219.911cm^2 area and 87.965cm^3 volume. There is Fat Layer (Subcutaneous Tissue) exposed. There is no tunneling or undermining noted. There is a medium amount of serosanguineous drainage noted. There is medium (34-66%) red, pink granulation within the wound bed. There is a small (1-33%) amount of necrotic tissue within the wound bed including Eschar and Adherent Slough. Wound #2 status is Open. Original cause of wound was Gradually Appeared. The date acquired was: 05/05/2021. The wound has been in treatment 12 weeks. The wound is located on the Penis. The wound measures 0.5cm length x 1cm width x 0.1cm depth; 0.393cm^2 area and 0.039cm^3 volume. There is Fat Layer (Subcutaneous Tissue) exposed. There is no tunneling or undermining noted. There is a medium amount of serosanguineous drainage noted. There is medium (34-66%) granulation within the wound bed. There is a medium (34-66%) amount of necrotic tissue within the wound bed including Eschar and Adherent Slough. 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 Edema Control - Lymphedema / Segmental Compressive Device / Other: Elevate, Exercise Daily and Avoid Standing for Long Periods of Time. Elevate legs to the level of the heart and pump ankles as often as possible Elevate leg(s) parallel to the floor when sitting. WOUND #1: - Lower Leg Wound Laterality: Left, Posterior Cleanser: Byram Ancillary Kit - 15 Day Supply (Generic) 1 x Per Day/30 Days Discharge Instructions: Use supplies as instructed; Kit contains: (15) Saline Bullets; (15) 3x3 Gauze; 15 pr  Gloves Primary Dressing: Kerlix AMD Roll Dressing, 4.5x 4.1 (in/in) (Generic) 1 x Per Day/30 Days Discharge Instructions: moisten with dakins Secondary Dressing: Kerlix 4.5 x 4.1 (in/yd) (Generic) 1 x Per Day/30 Days Discharge Instructions: Apply Kerlix 4.5 x 4.1 (in/yd) as instructed Secured With: 71M Medipore H Soft Cloth Surgical Tape, 2x2 (in/yd) (Generic) 1 x Per Day/30 Days WOUND #2: - Penis Wound Laterality: Cleanser: Byram Ancillary Kit - 15 Day Supply (Generic) 1 x Per Day/30 Days Soberanes, Luchiano L. ([id number]) Discharge Instructions: Use supplies as instructed; Kit contains: (15) Saline Bullets; (15) 3x3 Gauze; 15 pr Gloves Primary Dressing: Gauze (Generic) 1 x Per Day/30 Days Discharge Instructions: moistened with santyl Primary Dressing: Santyl Collagenase Ointment, 30 (gm), tube 1 x Per Day/30 Days 1. Would recommend currently that we continue with the Dakin's moistened gauze dressing to the left lower extremity I think this is doing a good job. 2. I am also going to continue Santyl for the penis area on the glans that is actually doing a good job in helping to loosen up a lot of the dead tissue. 3. I am also can recommend that we have the  patient continue the sodium thiosulfate I think this would be a good thing still for him to have based on whereat and the disease process. They are going to talk to them at the dialysis center tomorrow and see what they have to say also to give my card to them so that if they need to get in touch with me they are able to all be in [place] but can still talk if I need to. We will see patient back for reevaluation in 1 week here in the clinic. If anything worsens or changes patient will contact our office for additional recommendations. Electronic Signature(s) Signed: 09/04/2021 1:57:16 PM By: Worthy Keeler PA-C Entered By: Worthy Keeler on 09/04/2021 13:57:16 Gafford, Melody L. ([id number]) -------------------------------------------------------------------------------- SuperBill Details Patient Name: [name] L. Date of Service: 09/04/2021 Medical Record Number: [id number] Patient Account Number: [id number] Date of Birth/Sex: 06-Mar-1941 (81 y.o. 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 with fat layer exposed Z99.2 Dependence on renal dialysis N18.4 Chronic kidney disease, stage 4 (severe) I25.10 Atherosclerotic heart disease of native coronary artery without angina pectoris Facility Procedures CPT4 Code: [id number] Description: 99213 - WOUND CARE VISIT-LEV 3 EST PT Modifier: Quantity: 1 Physician Procedures CPT4 Code: [id number] Description: 99214 - WC PHYS LEVEL 4 - EST PT Modifier: Quantity: 1 CPT4 Code: Description: ICD-10 Diagnosis Description 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

## 2021-09-05 DIAGNOSIS — N2581 Secondary hyperparathyroidism of renal origin: Secondary | ICD-10-CM | POA: Diagnosis not present

## 2021-09-05 DIAGNOSIS — D689 Coagulation defect, unspecified: Secondary | ICD-10-CM | POA: Diagnosis not present

## 2021-09-05 DIAGNOSIS — N186 End stage renal disease: Secondary | ICD-10-CM | POA: Diagnosis not present

## 2021-09-05 DIAGNOSIS — Z992 Dependence on renal dialysis: Secondary | ICD-10-CM | POA: Diagnosis not present

## 2021-09-06 ENCOUNTER — Encounter: Payer: Self-pay | Admitting: Student in an Organized Health Care Education/Training Program

## 2021-09-06 ENCOUNTER — Other Ambulatory Visit: Payer: Self-pay

## 2021-09-06 ENCOUNTER — Other Ambulatory Visit: Payer: Self-pay | Admitting: *Deleted

## 2021-09-06 ENCOUNTER — Encounter: Payer: Self-pay | Admitting: Family Medicine

## 2021-09-06 ENCOUNTER — Ambulatory Visit
Payer: PPO | Attending: Student in an Organized Health Care Education/Training Program | Admitting: Student in an Organized Health Care Education/Training Program

## 2021-09-06 VITALS — BP 128/73 | HR 77 | Temp 97.3°F | Resp 16 | Ht 68.0 in | Wt 168.0 lb

## 2021-09-06 DIAGNOSIS — Z794 Long term (current) use of insulin: Secondary | ICD-10-CM | POA: Insufficient documentation

## 2021-09-06 DIAGNOSIS — G894 Chronic pain syndrome: Secondary | ICD-10-CM | POA: Insufficient documentation

## 2021-09-06 DIAGNOSIS — E1142 Type 2 diabetes mellitus with diabetic polyneuropathy: Secondary | ICD-10-CM | POA: Diagnosis not present

## 2021-09-06 DIAGNOSIS — N186 End stage renal disease: Secondary | ICD-10-CM | POA: Diagnosis not present

## 2021-09-06 DIAGNOSIS — L97923 Non-pressure chronic ulcer of unspecified part of left lower leg with necrosis of muscle: Secondary | ICD-10-CM | POA: Insufficient documentation

## 2021-09-06 NOTE — Progress Notes (Signed)
Safety precautions to be maintained throughout the outpatient stay will include: orient to surroundings, keep bed in low position, maintain call bell within reach at all times, provide assistance with transfer out of bed and ambulation.  

## 2021-09-06 NOTE — Telephone Encounter (Signed)
Note from visit with [facility] today: Pharmacologic Plan: As per protocol, I have not taken over any controlled substance management, pending the results of ordered tests and/or consults.            Initial impression: Pending review of available data and ordered tests.  Last refill 08/16/21, #120, 0 refills  Please review, thanks!

## 2021-09-06 NOTE — Progress Notes (Signed)
Patient: Mark Jumbo Sr.  Service Category: E/M  Provider: [name], MD  DOB: 1941-01-24  DOS: 09/06/2021  Referring Provider: [name], MD  MRN: [id number]  Setting: Ambulatory outpatient  PCP: [name], MD  Type: New Patient  Specialty: Interventional Pain Management    Location: Office  Delivery: Face-to-face     Primary Reason(s) for Visit: Encounter for initial evaluation of one or more chronic problems (new to examiner) potentially causing chronic pain, and posing a threat to normal musculoskeletal function. (Level of risk: High) CC: Leg Pain (Left lower)  HPI  [name] is a 81 y.o. year old, male patient, who comes for the first time to our practice referred by [name], MD for our initial evaluation of his chronic pain. He has Shortness of breath; Coronary atherosclerosis of native coronary artery; Type 2 diabetes mellitus ([place]); Essential hypertension, benign; Mixed hyperlipidemia; ED (erectile dysfunction); Malignant neoplasm of prostate ([place]); Diabetic retinopathy ([place]); NAFLD (nonalcoholic fatty liver disease); Peripheral edema; DM type 2 with diabetic peripheral neuropathy ([place]); Uncontrolled type 2 diabetes mellitus with hyperglycemia ([place]); Hyperkalemia; Fever, unknown origin; Altered mental state; Acute on chronic renal failure ([place]); Pancreatic insufficiency; Wears hearing aid; HOH (hard of hearing); AKI (acute kidney injury) ([place]); Acute renal failure superimposed on stage 3b chronic kidney disease ([place]); Acute metabolic encephalopathy; WHQPR-91 virus infection; Metabolic acidosis; SVT (supraventricular tachycardia) ([place]); Pressure injury of skin; Shingles; Vascular dialysis catheter in place [facility]); Metabolic encephalopathy; ESRD (end stage renal disease) ([place]); Hypoglycemia; Sepsis ([place]); E. Faecalis and Staph Aureus UTI; Hypocalcemia; and Nausea on their problem list. Today he comes in for evaluation of his Leg Pain (Left lower)  Pain  Assessment: Location: Left Leg Radiating: lower leg; greatest pain is below wound, all the way around Onset: More than a month ago Duration: Chronic pain Quality: Burning, Stabbing Severity: 7 /10 (subjective, self-reported pain score)  Effect on ADL: difficult to sleep Timing: Constant Modifying factors: meds help keep the ache away - nothing helps severe pain BP: 128/73   HR: 77  Onset and Duration: Gradual and Date of onset: 3 months ago Cause of pain:  calciphylaxis of LLE Severity: Getting worse, NAS-11 at its worse: 9/10, NAS-11 at its best: 7/10, NAS-11 now: 7/10, and NAS-11 on the average: 7/10 Timing: Morning, During activity or exercise, and After a period of immobility Aggravating Factors: Kneeling and Prolonged standing Alleviating Factors: Lying down, Medications, and Resting Associated Problems: Pain that wakes patient up and Pain that does not allow patient to sleep Quality of Pain: Burning, Constant, and Stabbing Previous Examinations or Tests: The patient denies . Previous Treatments: Narcotic medications  [name] is a very pleasant 81 year old male who presents with a chief complaint of left calf and left thigh pain related to calciphylaxis.  He has a history of end-stage renal disease and was previously dialysis.  He has severe pain in that region.  He has an open ulcer which I saw a picture of.  He has been taking hydrocodone 10 mg every 6 hours as needed which was recently reduced to 7.5 mg every 6 hours.  He states that the dose reduction to 7.5 mg has resulted in suboptimal pain management.  He is being referred here by his primary care provider.  Of note he has tried tramadol in the past which was not very effective.  He has also tried gabapentin in the past which resulted in cognitive dysfunction.  He did not have any side effects at his  previous dose of hydrocodone 10 mg every 6 hours as needed.  He was previously going to the wound care clinic.  Patient also has  insulin-dependent type 2 diabetes and has paresthesias of bilateral feet.   Historic Controlled Substance Pharmacotherapy Review  PMP and historical list of controlled substances:   08/18/2021  08/16/2021   1  Hydrocodone-Acetamin 7.5-325 120.00  30  [place]  [id number]   Wal (0019)  0/0  30.00 MME  Comm Ins  [place]    07/19/2021  07/19/2021   1  Hydrocodone-Acetamin 7.5-325 120.00  30  [place]  [id number]   Wal (0019)  0/0  30.00 MME  Medicare  [place]     Historical Monitoring: The patient  reports no history of drug use. List of all UDS Test(s): Lab Results  Component Value Date   COCAINSCRNUR NONE DETECTED 12/02/2018   COCAINSCRNUR NONE DETECTED 07/30/2012   THCU NONE DETECTED 12/02/2018   THCU NONE DETECTED 07/30/2012   List of other Serum/Urine Drug Screening Test(s):  Lab Results  Component Value Date   COCAINSCRNUR NONE DETECTED 12/02/2018   COCAINSCRNUR NONE DETECTED 07/30/2012   THCU NONE DETECTED 12/02/2018   THCU NONE DETECTED 07/30/2012   Historical Background Evaluation: [place] PMP: PDMP reviewed during this encounter. Online review of the past 84-monthperiod conducted.               Department of public safety, offender search: (Editor, commissioningInformation) Non-contributory Risk Assessment Profile: Aberrant behavior: None observed or detected today Risk factors for fatal opioid overdose: None identified today Fatal overdose hazard ratio (HR): Calculation deferred Non-fatal overdose hazard ratio (HR): Calculation deferred Risk of opioid abuse or dependence: 0.7-3.0% with doses ? 36 MME/day and 6.1-26% with doses ? 120 MME/day. Substance use disorder (SUD) risk level: See below Personal History of Substance Abuse (SUD-Substance use disorder):  Alcohol: Negative  Illegal Drugs: Negative  Rx Drugs: Negative  ORT Risk Level calculation: Low Risk  Opioid Risk Tool - 09/06/21 1335       Family History of Substance Abuse   Alcohol Negative    Illegal Drugs Negative    Rx Drugs Negative       Personal History of Substance Abuse   Alcohol Negative    Illegal Drugs Negative    Rx Drugs Negative      Age   Age between 112-45years  No      History of Preadolescent Sexual Abuse   History of Preadolescent Sexual Abuse Negative or Male      Psychological Disease   Psychological Disease Negative    Depression Negative      Total Score   Opioid Risk Tool Scoring 0    Opioid Risk Interpretation Low Risk            ORT Scoring interpretation table:  Score <3 = Low Risk for SUD  Score between 4-7 = Moderate Risk for SUD  Score >8 = High Risk for Opioid Abuse   PHQ-2 Depression Scale:  Total score:    PHQ-2 Scoring interpretation table: (Score and probability of major depressive disorder)  Score 0 = No depression  Score 1 = 15.4% Probability  Score 2 = 21.1% Probability  Score 3 = 38.4% Probability  Score 4 = 45.5% Probability  Score 5 = 56.4% Probability  Score 6 = 78.6% Probability   PHQ-9 Depression Scale:  Total score:    PHQ-9 Scoring interpretation table:  Score 0-4 = No depression  Score 5-9 =  Mild depression  Score 10-14 = Moderate depression  Score 15-19 = Moderately severe depression  Score 20-27 = Severe depression (2.4 times higher risk of SUD and 2.89 times higher risk of overuse)   Pharmacologic Plan: As per protocol, I have not taken over any controlled substance management, pending the results of ordered tests and/or consults.            Initial impression: Pending review of available data and ordered tests.  Meds   Current Outpatient Medications:    aspirin EC 81 MG tablet, Take 81 mg by mouth at bedtime. , Disp: , Rfl:    diltiazem (CARDIZEM CD) 240 MG 24 hr capsule, Take 240 mg by mouth daily., Disp: , Rfl:    HYDROcodone-acetaminophen (NORCO) 7.5-325 MG tablet, Take 1 tablet by mouth every 6 (six) hours as needed for moderate pain., Disp: 120 tablet, Rfl: 0   insulin aspart (NOVOLOG) 100 UNIT/ML injection, Inject 8 Units into the skin  3 (three) times daily with meals. (Patient taking differently: Inject 10 Units into the skin in the morning.), Disp: 10 mL, Rfl: 11   insulin glargine (LANTUS) 100 UNIT/ML injection, Inject 0.12 mLs (12 Units total) into the skin at bedtime. (Patient taking differently: Inject 12 Units into the skin in the morning.), Disp: 10 mL, Rfl: 11   metoprolol tartrate (LOPRESSOR) 50 MG tablet, Take 1 tablet (50 mg total) by mouth 2 (two) times daily., Disp: 180 tablet, Rfl: 1   Pancrelipase, Lip-Prot-Amyl, (ZENPEP) 10000-32000 units CPEP, Take 1 capsule by mouth 3 (three) times daily with meals., Disp: , Rfl:    pravastatin (PRAVACHOL) 10 MG tablet, TAKE 1 TABLET(10 MG) BY MOUTH DAILY WITH BREAKFAST, Disp: 90 tablet, Rfl: 1  Imaging Review  Lumbosacral Imaging: Lumbar MR wo contrast: Results for orders placed during the hospital encounter of 03/28/20  MR Lumbar Spine Wo Contrast  Narrative CLINICAL DATA:  Low back pain radiating down both legs for 20 years  EXAM: MRI LUMBAR SPINE WITHOUT CONTRAST  TECHNIQUE: Multiplanar, multisequence MR imaging of the lumbar spine was performed. No intravenous contrast was administered.  COMPARISON:  Radiography from 2 weeks ago  FINDINGS: Segmentation: Transitional S1 vertebra with rudimentary disc space at S1-2. This is based on the lowest ribs by radiography  Alignment:  Negative for listhesis.  Vertebrae: No fracture, evidence of discitis, or aggressive bone lesion. L2 hemangioma  Conus medullaris and cauda equina: Conus extends to the L1 level. Conus and cauda equina appear normal.  Paraspinal and other soft tissues: Atrophy of intrinsic back muscles and psoas on both sides.  Disc levels:  T12- L1: Mild disc narrowing and minimal bulging  L1-L2: Mild disc narrowing and bulging. Mild facet spurring. No impingement  L2-L3: Disc narrowing and endplate degeneration with endplate ridging and disc bulging. Mild facet spurring. No  impingement  L3-L4: Disc narrowing and bulging with endplate ridging. Facet osteoarthritis and ligamentum flavum thickening. Moderate spinal stenosis. Noncompressive bilateral foraminal narrowing  L4-L5: Interbody fusion or ankylosis. There is prominent endplate ridging eccentric to the right with high-grade spinal stenosis. The right-sided canal is completely effaced. Facet osteoarthritis with spurring greater on the right. Moderate bilateral foraminal narrowing.  L5-S1:Facet osteoarthritis, severe on the right with bulky spurring and right foraminal/subarticular recess compression. Spinal stenosis is overall moderate to advanced.  IMPRESSION: 1. Transitional S1 vertebra. 2. Degenerative disease greatest at L3-4 to L5-S1 where there is compressive spinal stenosis. 3. L4-5 asymmetric right subarticular recess stenosis which is completely effaced. 4. L5-S1 advanced  right foraminal and subarticular recess impingement with right L5 and S1 compression. 5. L4-5 intervertebral ankylosis.   Electronically Signed By: Monte Fantasia M.D. On: 03/29/2020 07:27  DG Lumbar Spine Complete  Narrative Clinical:  Lower back pain, getting worse, no trauma  X-rays were done of the lumbar spine, five views.  There is marked degenerative joint disease of the lumbar spine with decreased disc space at L5-S1 and no disc space at L4-L5 and decreased disc space at L3-L4.  No fracture is noted.  Anterior osteophytes are present.  Bone quality is good.  Bilateral total hips are present.  Impression: marked degenerative joint disease of the lumbar spine.  Electronically Signed Sanjuana Kava, MD 8/10/20212:11 PM  DG Wrist Complete Right  Narrative CLINICAL DATA:  The patient tripped and fell this morning with a right wrist injury and pain. Initial encounter.  EXAM: RIGHT WRIST - COMPLETE 3+ VIEW  COMPARISON:  None.  FINDINGS: No acute bony or joint abnormality is identified. No  notable arthropathy is seen. Atherosclerotic calcifications are noted.  IMPRESSION: No acute abnormality.   Electronically Signed By: Inge Rise M.D. On: 04/07/2015 07:33  Wrist-L DG Complete: No results found for this or any previous visit.   Hand Imaging: Hand-R DG Complete: No results found for this or any previous visit.  Hand-L DG Complete: No results found for this or any previous visit.   Complexity Note: Imaging results reviewed. Results shared with Mr. Mcclean, using Layman's terms.                         ROS  Cardiovascular: Heart trouble  CAD Pulmonary or Respiratory: Shortness of breath Neurological: No reported neurological signs or symptoms such as seizures, abnormal skin sensations, urinary and/or fecal incontinence, being born with an abnormal open spine and/or a tethered spinal cord Psychological-Psychiatric: No reported psychological or psychiatric signs or symptoms such as difficulty sleeping, anxiety, depression, delusions or hallucinations (schizophrenial), mood swings (bipolar disorders) or suicidal ideations or attempts Gastrointestinal: No reported gastrointestinal signs or symptoms such as vomiting or evacuating blood, reflux, heartburn, alternating episodes of diarrhea and constipation, inflamed or scarred liver, or pancreas or irrregular and/or infrequent bowel movements Genitourinary: Difficulty producing urine (Renal failure) and Dialysis 3 days per week- end stage Hematological: No reported hematological signs or symptoms such as prolonged bleeding, low or poor functioning platelets, bruising or bleeding easily, hereditary bleeding problems, low energy levels due to low hemoglobin or being anemic Endocrine: High blood sugar requiring insulin (IDDM) Rheumatologic: Joint aches and or swelling due to excess weight (Osteoarthritis) Musculoskeletal: Negative for myasthenia gravis, muscular dystrophy, multiple sclerosis or malignant hyperthermia Work  History: Retired  Allergies  Mr. Solorio is allergic to enalapril maleate.  Laboratory Chemistry Profile   Renal Lab Results  Component Value Date   BUN 39 (H) 05/15/2021   CREATININE 3.00 (H) 05/15/2021   LABCREA 35.88 01/16/2021   BCR 19 04/12/2021   GFR 65.09 01/29/2017   GFRAA 42 (L) 12/30/2019   GFRNONAA 18 (L) 04/18/2021   GFRNONAA 17 (L) 04/18/2021   PROTEINUR 30 (A) 04/13/2021     Electrolytes Lab Results  Component Value Date   NA 136 05/15/2021   K 4.2 05/15/2021   CL 99 05/15/2021   CALCIUM 7.3 (L) 04/18/2021   CALCIUM 7.1 (L) 04/18/2021   MG 1.7 02/06/2021   PHOS 6.0 (H) 04/18/2021     Hepatic Lab Results  Component Value Date   AST 16  04/18/2021   ALT 9 04/18/2021   ALBUMIN 2.0 (L) 04/18/2021   ALBUMIN 2.0 (L) 04/18/2021   ALKPHOS 143 (H) 04/18/2021   LIPASE 21 12/23/2020   AMMONIA 50 12/23/2019     ID Lab Results  Component Value Date   SARSCOV2NAA NEGATIVE 04/13/2021   STAPHAUREUS POSITIVE (A) 04/16/2021   MRSAPCR NEGATIVE 04/16/2021     Bone Lab Results  Component Value Date   VD25OH 25.27 (L) 12/28/2020   TESTOFREE 43.1 (L) 02/28/2015   TESTOSTERONE 295 11/25/2017     Endocrine Lab Results  Component Value Date   GLUCOSE 160 (H) 05/15/2021   GLUCOSEU NEGATIVE 04/13/2021   HGBA1C 6.2 (H) 03/19/2021   TSH 1.477 01/04/2021   FREET4 0.93 01/04/2021   TESTOFREE 43.1 (L) 02/28/2015   TESTOSTERONE 295 11/25/2017   SHBG 41 02/28/2015     Neuropathy Lab Results  Component Value Date   VITAMINB12 634 01/04/2021   FOLATE 24.5 01/04/2021   HGBA1C 6.2 (H) 03/19/2021     CNS No results found for: COLORCSF, APPEARCSF, RBCCOUNTCSF, WBCCSF, POLYSCSF, LYMPHSCSF, EOSCSF, PROTEINCSF, GLUCCSF, JCVIRUS, CSFOLI, IGGCSF, LABACHR, ACETBL, LABACHR, ACETBL   Inflammation (CRP: Acute   ESR: Chronic) Lab Results  Component Value Date   CRP 14.5 (H) 01/09/2021   ESRSEDRATE 11 12/02/2018   LATICACIDVEN 1.2 04/13/2021     Rheumatology No  results found for: RF, ANA, LABURIC, URICUR, LYMEIGGIGMAB, LYMEABIGMQN, HLAB27   Coagulation Lab Results  Component Value Date   INR 1.5 (H) 04/13/2021   LABPROT 18.0 (H) 04/13/2021   APTT 37 (H) 04/13/2021   PLT 209 04/18/2021   DDIMER 0.75 (H) 12/02/2018     Cardiovascular Lab Results  Component Value Date   BNP 700.0 (H) 04/13/2021   CKTOTAL 192 11/25/2017   TROPONINI <0.03 08/04/2018   HGB 10.9 (L) 05/15/2021   HCT 32.0 (L) 05/15/2021     Screening Lab Results  Component Value Date   SARSCOV2NAA NEGATIVE 04/13/2021   STAPHAUREUS POSITIVE (A) 04/16/2021   MRSAPCR NEGATIVE 04/16/2021     Cancer No results found for: CEA, CA125, LABCA2   Allergens No results found for: ALMOND, APPLE, ASPARAGUS, AVOCADO, BANANA, BARLEY, BASIL, BAYLEAF, GREENBEAN, LIMABEAN, WHITEBEAN, BEEFIGE, REDBEET, BLUEBERRY, BROCCOLI, CABBAGE, MELON, CARROT, CASEIN, CASHEWNUT, CAULIFLOWER, CELERY     Note: Lab results reviewed.  [place]  Drug: Mr. Rosamond  reports no history of drug use. Alcohol:  reports no history of alcohol use. Tobacco:  reports that he has never smoked. He has never used smokeless tobacco. Medical:  has a past medical history of Arthritis, Chronic back pain, Chronic kidney disease, Coronary atherosclerosis of native coronary artery, Deafness in right ear, Diabetes mellitus, Diabetic retinopathy ([place]), Diastolic dysfunction (11/2593), Essential hypertension, benign, Heart murmur, HOH (hard of hearing), Hyperkalemia, Kidney stones, Mixed hyperlipidemia, NAFLD (nonalcoholic fatty liver disease), Pancreatic insufficiency, Prostate cancer ([place]) (2011), Shortness of breath dyspnea, Subdural hematoma, Type 2 diabetes mellitus ([place]), and Wears hearing aid. Family: family history includes Diabetes type II in his mother; Heart attack in his father.  Past Surgical History:  Procedure Laterality Date   APPENDECTOMY     AV FISTULA PLACEMENT Left 05/15/2021   Procedure: INSERTION OF LEFT ARM  ARTERIOVENOUS (AV) GORE-TEX GRAFT;  Surgeon: Rosetta Posner, MD;  Location: [facility];  Service: Vascular;  Laterality: Left;   Bilateral hip replacement     CATARACT EXTRACTION W/PHACO Left 05/15/2015   Procedure: CATARACT EXTRACTION PHACO AND INTRAOCULAR LENS PLACEMENT ([place]);  Surgeon: Ronnell Freshwater, MD;  Location:  [facility];  Service: Ophthalmology;  Laterality: Left;  DIABETIC - insulin pump and oral meds   CATARACT EXTRACTION W/PHACO Right 08/28/2015   Procedure: CATARACT EXTRACTION PHACO AND INTRAOCULAR LENS PLACEMENT ([facility]);  Surgeon: Ronnell Freshwater, MD;  Location: [facility];  Service: Ophthalmology;  Laterality: Right;  DIABETIC PER PT AND CINDY PLEASE KEEP ARRIVAL TIME AFTER 8AM    CHOLECYSTECTOMY     HERNIA REPAIR     INSERTION OF DIALYSIS CATHETER Right 01/22/2021   Procedure: INSERTION OF DIALYSIS CATHETER;  Surgeon: Virl Cagey, MD;  Location: [facility];  Service: General;  Laterality: Right;   INSERTION OF DIALYSIS CATHETER Right 04/16/2021   Procedure: INSERTION OF DIALYSIS CATHETER;  Surgeon: Virl Cagey, MD;  Location: [facility];  Service: General;  Laterality: Right;   PROSTATECTOMY  2011   Active Ambulatory Problems    Diagnosis Date Noted   Shortness of breath 12/05/2011   Coronary atherosclerosis of native coronary artery 12/05/2011   Type 2 diabetes mellitus ([place]) 12/05/2011   Essential hypertension, benign 12/05/2011   Mixed hyperlipidemia 12/05/2011   ED (erectile dysfunction) 01/05/2014   Malignant neoplasm of prostate ([place]) 06/26/2011   Diabetic retinopathy ([place])    NAFLD (nonalcoholic fatty liver disease)    Peripheral edema 06/25/2018   DM type 2 with diabetic peripheral neuropathy ([place]) 09/15/2018   Uncontrolled type 2 diabetes mellitus with hyperglycemia ([place]) 09/15/2018   Hyperkalemia 12/02/2018   Fever, unknown origin 12/02/2018   Altered mental state 12/02/2018   Acute on chronic renal failure ([place]) 12/02/2018    Pancreatic insufficiency 12/02/2018   Wears hearing aid 12/02/2018   HOH (hard of hearing) 12/02/2018   AKI (acute kidney injury) ([place]) 12/23/2020   Acute renal failure superimposed on stage 3b chronic kidney disease ([place]) 86/75/4492   Acute metabolic encephalopathy 01/00/7121   COVID-19 virus infection 97/58/8325   Metabolic acidosis    SVT (supraventricular tachycardia) ([place]) 01/07/2021   Pressure injury of skin 01/09/2021   Shingles 01/14/2021   Vascular dialysis catheter in place [facility])    Metabolic encephalopathy 49/82/6415   ESRD (end stage renal disease) ([place]) 02/02/2021   Hypoglycemia 02/02/2021   Sepsis ([place]) 02/02/2021   E. Faecalis and Staph Aureus UTI 02/02/2021   Hypocalcemia 06/19/2021   Nausea 05/19/2021   Resolved Ambulatory Problems    Diagnosis Date Noted   No Resolved Ambulatory Problems   Past Medical History:  Diagnosis Date   Arthritis    Chronic back pain    Chronic kidney disease    Deafness in right ear    Diabetes mellitus    Diastolic dysfunction 03/3093   Heart murmur    Kidney stones    Prostate cancer ([place]) 2011   Shortness of breath dyspnea    Subdural hematoma    Constitutional Exam  General appearance: Well nourished, well developed, and well hydrated. In no apparent acute distress Vitals:   09/06/21 1326  BP: 128/73  Pulse: 77  Resp: 16  Temp: (!) 97.3 F (36.3 C)  TempSrc: Temporal  SpO2: 100%  Weight: 168 lb (76.2 kg)  Height: _0  (1.727 m)   BMI Assessment: Estimated body mass index is 25.54 kg/m as calculated from the following:   Height as of this encounter: _1  (1.727 m).   Weight as of this encounter: 168 lb (76.2 kg).  BMI interpretation table: BMI level Category Range association with higher incidence of chronic pain  <18 kg/m2 Underweight   18.5-24.9 kg/m2 Ideal body  weight   25-29.9 kg/m2 Overweight Increased incidence by 20%  30-34.9 kg/m2 Obese (Class I) Increased incidence by 68%  35-39.9 kg/m2 Severe  obesity (Class II) Increased incidence by 136%  >40 kg/m2 Extreme obesity (Class III) Increased incidence by 254%   Patient's current BMI Ideal Body weight  Body mass index is 25.54 kg/m. Ideal body weight: 68.4 kg (150 lb 12.7 oz) Adjusted ideal body weight: 71.5 kg (157 lb 10.8 oz)   BMI Readings from Last 4 Encounters:  09/06/21 25.54 kg/m  08/16/21 25.85 kg/m  05/09/21 25.85 kg/m  04/18/21 28.89 kg/m   Wt Readings from Last 4 Encounters:  09/06/21 168 lb (76.2 kg)  08/16/21 170 lb (77.1 kg)  05/09/21 170 lb (77.1 kg)  04/18/21 190 lb 0.6 oz (86.2 kg)    Psych/Mental status: Alert, oriented x 3 (person, place, & time)       Eyes: PERLA Respiratory: No evidence of acute respiratory distress  Left lower extremity ulcer, erythematous rash, necrotic eschar  Assessment  Primary Diagnosis & Pertinent Problem List: The primary encounter diagnosis was Chronic pain syndrome. Diagnoses of Calciphylaxis of left lower extremity with nonhealing ulcer with necrosis of muscle ([place]), End stage renal disease ([place]), and Type 2 diabetes mellitus with diabetic polyneuropathy, with long-term current use of insulin ([place]) were also pertinent to this visit.  Visit Diagnosis (New problems to examiner): 1. Chronic pain syndrome   2. Calciphylaxis of left lower extremity with nonhealing ulcer with necrosis of muscle ([place])   3. End stage renal disease ([place])   4. Type 2 diabetes mellitus with diabetic polyneuropathy, with long-term current use of insulin ([place])    Plan of Care (Initial workup plan)  Note: Mr. Schamp was reminded that as per protocol, today's visit has been an evaluation only. We have not taken over the patient's controlled substance management.  Severe pain of left calf related to calciphylaxis.  Urine toxicology screen today which is customary for new patients.  I will have the patient return in approximately 2 weeks to review these results and so long as they are satisfactory I  will have the patient's sign a pain contract with our clinic and can take over his chronic opioid therapy of hydrocodone 10 mg every 6 hours as needed, quantity 120 for the month, MME equals 40.  He states that his pain was well managed on this before.  He has failed gabapentin and does not want to try anything that could result in cognitive dysfunction such as Lyrica or Cymbalta.  Avoid NSAIDs.  Can take acetaminophen as needed.  Consider Qutenza capsaicin topical treatment for painful diabetic polyneuropathy paresthesias of his bilateral feet.  Consider lumbar spinal injections for low back pain and lumbar radicular pain secondary to foraminal stenosis and disc herniations as noted above.  Problem-specific plan: No problem-specific Assessment & Plan notes found for this encounter. Lab Orders         Drug Screen 10 W/Conf, Serum         ToxASSURE Select 13 (MW), Urine      Provider-requested follow-up: Return in about 2 weeks (around 09/20/2021) for Medication Management (2nd pt visit).  I spent a total of 60 minutes reviewing chart data, face-to-face evaluation with the patient, counseling and coordination of care as detailed above.   Future Appointments  Date Time Provider [place]  09/13/2021  1:15 PM Jeri Cos [place] III, PA-C [facility] None  09/18/2021 11:40 AM [name], MD [facility] None  09/18/2021  2:45  PM Jeri Cos [place] III, PA-C [facility] None  09/25/2021 12:30 PM Jeri Cos [place] III, PA-C [facility] None  10/04/2021  1:15 PM Jeri Cos [place] III, PA-C [facility] None  08/22/2022  2:00 PM [facility]-NURSE HEALTH ADVISOR [facility] None    Note by: [name], MD Date: 09/06/2021; Time: 2:52 PM

## 2021-09-07 MED ORDER — HYDROCODONE-ACETAMINOPHEN 7.5-325 MG PO TABS: 1.0000 | ORAL_TABLET | Freq: Four times a day (QID) | ORAL | 0 refills | Status: DC | PRN

## 2021-09-10 DIAGNOSIS — Z992 Dependence on renal dialysis: Secondary | ICD-10-CM | POA: Diagnosis not present

## 2021-09-10 DIAGNOSIS — D689 Coagulation defect, unspecified: Secondary | ICD-10-CM | POA: Diagnosis not present

## 2021-09-10 DIAGNOSIS — N2581 Secondary hyperparathyroidism of renal origin: Secondary | ICD-10-CM | POA: Diagnosis not present

## 2021-09-10 DIAGNOSIS — N186 End stage renal disease: Secondary | ICD-10-CM | POA: Diagnosis not present

## 2021-09-11 ENCOUNTER — Encounter: Payer: Self-pay | Admitting: Family Medicine

## 2021-09-11 ENCOUNTER — Ambulatory Visit: Payer: PPO | Admitting: Physician Assistant

## 2021-09-11 DIAGNOSIS — E113313 Type 2 diabetes mellitus with moderate nonproliferative diabetic retinopathy with macular edema, bilateral: Secondary | ICD-10-CM | POA: Diagnosis not present

## 2021-09-13 ENCOUNTER — Encounter: Payer: PPO | Attending: Physician Assistant | Admitting: Physician Assistant

## 2021-09-13 ENCOUNTER — Other Ambulatory Visit: Payer: Self-pay

## 2021-09-13 DIAGNOSIS — L98492 Non-pressure chronic ulcer of skin of other sites with fat layer exposed: Secondary | ICD-10-CM | POA: Diagnosis not present

## 2021-09-13 DIAGNOSIS — N184 Chronic kidney disease, stage 4 (severe): Secondary | ICD-10-CM | POA: Insufficient documentation

## 2021-09-13 DIAGNOSIS — E1122 Type 2 diabetes mellitus with diabetic chronic kidney disease: Secondary | ICD-10-CM | POA: Diagnosis not present

## 2021-09-13 DIAGNOSIS — I2510 Atherosclerotic heart disease of native coronary artery without angina pectoris: Secondary | ICD-10-CM | POA: Diagnosis not present

## 2021-09-13 DIAGNOSIS — L97222 Non-pressure chronic ulcer of left calf with fat layer exposed: Secondary | ICD-10-CM | POA: Diagnosis not present

## 2021-09-13 DIAGNOSIS — Z992 Dependence on renal dialysis: Secondary | ICD-10-CM | POA: Insufficient documentation

## 2021-09-13 DIAGNOSIS — L97822 Non-pressure chronic ulcer of other part of left lower leg with fat layer exposed: Secondary | ICD-10-CM | POA: Insufficient documentation

## 2021-09-13 LAB — TOXASSURE SELECT 13 (MW), URINE

## 2021-09-13 NOTE — Progress Notes (Addendum)
AUGUSTE, TEBBETTS ([id number]) Visit Report for 09/13/2021 Chief Complaint Document Details Patient Name: [name], [name]. Date of Service: 09/13/2021 1:15 PM Medical Record Number: [id number] Patient Account Number: [id number] Date of Birth/Sex: 03-16-1941 (81 y.o. M) Treating RN: Carlene Coria Primary Care Provider: Jenna Luo Other Clinician: Referring Provider: Jenna Luo Treating Provider/Extender: Skipper Cliche in Treatment: 16 Information Obtained from: Patient Chief Complaint Left LE Calciphylaxis and Glans Penis Calciphylaxis Electronic Signature(s) Signed: 09/13/2021 1:31:07 PM By: Worthy Keeler PA-C Entered By: Worthy Keeler on 09/13/2021 13:31:07 [name], [name] ([id number]) -------------------------------------------------------------------------------- HPI Details Patient Name: Mark Morale L. Date of Service: 09/13/2021 1:15 PM Medical Record Number: [id number] Patient Account Number: [id number] Date of Birth/Sex: [birth year] (81 y.o. 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 This has been present for a couple of months currently he is on the sodium thiosulfate at dialysis. He tells me that they picked up on this quickly and have been managing it but he really has not had any help with anything else from the standpoint of progressing the wound itself. The patient does have again end-stage renal disease which currently is listed as stage IV but he is on renal dialysis. He also has coronary artery disease as well as going shortly for formal arterial studies although he did have a quick test  of the tibial artery with his vascular doctor when he went in to check on his fistula for his arm and subsequently they did not feel like he had any hemodynamically unstable flow into the lower extremity this is good news. Nonetheless I think that the biggest issue here is going to be getting some of the eschar off so that we get the wounds med headed in the appropriate direction. 05/31/2021 upon evaluation today patient appears to be doing well with regard to his leg ulceration with calciphylaxis. I am very pleased in this regard. Fortunately there does not appear to be any signs of active infection at this time which is great news. No fevers, chills, nausea, vomiting, or diarrhea. 06/07/2021 upon evaluation today patient appears to be doing well with regard to his legs. Unfortunately he is having an issue here with his penis as well he did mention at the end of last visit. I suggested that he go see urology. However when he went to see the urologist they basically told him there was not anything they could do. They recommended that we needed to be the ones to take care of this. With that being said this is an unusual area that to be honest a lot of the traditional wound care products we use are not really good to be good for this region. Potentially Medihoney alginate could be a possibility also think Santyl could be a possibility. With that being said I think initially my suggestion is probably can be for [place] to attempt the Santyl to see if that will benefit the patient. 06/21/2021 upon evaluation today patient's wound bed actually showed signs of good granulation and epithelization at this point. Fortunately I do not see any signs of active infection systemically which is great news and his legs are doing awesome I think you are making great progress. The Santyl on  the glans penis also has been of benefit this is looking tremendously better. Again the patient is pleased he also tells me the pain  is significantly improved. Overall I think we are headed in the appropriate direction here. 07/05/2021 upon evaluation today patient appears to be doing well with regard to his wounds. Both the area on the tip of his penis as well as the left lateral leg is doing well. There is some debridement this can be needed over the leg region. Fortunately I think that overall the Dakin's moistened gauze has done a great job here. No fevers, chills, nausea, vomiting, or diarrhea. 07/12/2021 upon evaluation today patient presents for reevaluation here in the clinic and overall I think that his wounds are doing significantly better which is great news. This includes both the left leg as well as the glans of the penis. Both are showing signs of significant improvement which is great and overall I think that we are headed in the right direction. I do not see any evidence of active infection at this moment. 07/19/2021 upon evaluation today patient appears to be doing better in regard to his wounds. Were getting much closer to complete resolution in regard to the necrotic tissue being removed from the leg. Overall I am extremely pleased with where we stand today. Overall I do not see any signs of active infection at this time. With regard to the patient's penis this area is showing signs of improvement as well and I am very pleased with that. 07/26/2021 upon evaluation today patient appears to be doing well with regard to his wounds. The leg in fact is looking quite well and I am very pleased with where we stand today. I do not see any signs of infection which is great and there is a lot of new granulation growth also also. With regard to the penis area this also showed signs of excellent improvement which is great news. 08/14/2020 upon evaluation today patient appears to be doing well with regard to his wounds both the leg as well as the glans penis region. Both are showing signs of improvement which is great news and  overall I am extremely pleased with where we stand at this point. I think that the Dakin's moistened gauze dressing for her leg is doing great and the Santyl is definitely helping with the glans penis location. 08/28/2021 upon evaluation today patient appears to be doing well with regard to his wounds on the left leg as well as the penis. Fortunately both are showing signs of improvement. I am going to perform some debridement in regard to the leg there is a small area which is having trouble getting completely clean my work on that a little bit more today. 09/04/2021 upon evaluation today patient appears to be doing pretty well in regard to his wounds. 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 Again he has I just found out today been off of the sodium thiosulfate for about 2 to 3 weeks. He tells me that the initial "3 weeks" was stated to be up by the nurse. With that being said I think he probably is going to need it for a bit longer to be honest. We discussed that today. Over the penis area the Santyl does seem to be loose and a lot of this up and that is good hopefully will be able  to get this completely cleared off it sometime shortly. 09/13/2021 upon evaluation today patient appears to be doing well with regard to his wound. He has been tolerating the dressing changes without complication. Fortunately there does not appear to be any signs of active infection locally or systemically which is great news. Overall I think that he is making progress with regard to his wounds in general. Electronic Signature(s) Signed: 09/13/2021 1:46:43 PM By: Worthy Keeler PA-C Entered By: Worthy Keeler on 09/13/2021 13:46:43 [name], [name] ([id number]) [name], [name] ([id number]) -------------------------------------------------------------------------------- Physical Exam Details Patient Name: Quinley,  Quamel L. Date of Service: 09/13/2021 1:15 PM Medical Record Number: [id number] Patient Account Number: [id number] Date of Birth/Sex: 20-Dec-1940 (81 y.o. M) Treating RN: Carlene Coria Primary Care Provider: Jenna Luo Other Clinician: Referring Provider: Jenna Luo Treating Provider/Extender: Skipper Cliche in Treatment: 62 Constitutional Well-nourished and well-hydrated in no acute distress. Respiratory normal breathing without difficulty. Psychiatric this patient is able to make decisions and demonstrates good insight into disease process. Alert and Oriented x 3. pleasant and cooperative. Notes Upon inspection patient's wounds are actually showing signs of significant improvement. I did discuss with his nephrologist reinitiating the sodium thiosulfate last week he called me in [place] on Wednesday. Nonetheless they are going go ahead and read up this for a short amount of time in order to hopefully get him through the last of this flareup on the leg. Hopefully after that will be at the point where we can just cruise forward and allow this to heal appropriately. Electronic Signature(s) Signed: 09/13/2021 2:29:34 PM By: Worthy Keeler PA-C Entered By: Worthy Keeler on 09/13/2021 14:29:34 [name], [name] ([id number]) -------------------------------------------------------------------------------- Physician Orders Details Patient Name: Mark Morale L. Date of Service: 09/13/2021 1:15 PM Medical Record Number: [id number] Patient Account Number: [id number] Date of Birth/Sex: [birth year] (81 y.o. 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 Edema Control - Lymphedema / Segmental Compressive Device / Other o Elevate, Exercise Daily and Avoid Standing for Long Periods of Time. o Elevate legs to the level of the heart and pump ankles as often as possible o Elevate leg(s) parallel to the floor when sitting. Wound Treatment Wound #1 - Lower Leg Wound Laterality: Left, Posterior Cleanser: Byram Ancillary Kit - 15 Day Supply (Generic) 1 x Per Day/30 Days Discharge Instructions: Use supplies as instructed; Kit contains: (15) Saline Bullets; (15) 3x3 Gauze; 15 pr Gloves Primary Dressing: Kerlix AMD Roll Dressing, 4.5x 4.1 (in/in) (Generic) 1 x Per Day/30 Days Discharge Instructions: moisten with dakins Secondary Dressing: Kerlix 4.5 x 4.1 (in/yd) (Generic) 1 x Per Day/30 Days Discharge Instructions: Apply Kerlix 4.5 x 4.1 (in/yd) as instructed Secured With: 74M Medipore H Soft Cloth Surgical Tape, 2x2 (in/yd) (Generic) 1 x Per Day/30 Days Wound #2 - Penis Cleanser: Byram Ancillary Kit - 15 Day Supply (Generic) 1 x Per Day/30 Days Discharge Instructions: Use supplies as instructed; Kit contains: (15) Saline Bullets; (15) 3x3 Gauze;  15 pr Gloves Primary Dressing: Gauze (Generic) 1 x Per Day/30 Days Discharge Instructions: moistened with santyl Primary Dressing: Santyl Collagenase Ointment, 30 (gm), tube 1 x Per Day/30 Days Electronic Signature(s) Signed: 09/13/2021 5:14:54 PM By: Worthy Keeler PA-C Signed: 09/14/2021 9:21:59 AM By: Carlene Coria RN Entered By: Carlene Coria on 09/13/2021 14:02:27 [name], [name]  ([id number]) -------------------------------------------------------------------------------- Problem List Details Patient Name: [name], [name] Date of Service: 09/13/2021 1:15 PM Medical Record Number: [id number] Patient Account Number: [id number] Date of Birth/Sex: [birth year] (81 y.o. M) Treating RN: Carlene Coria Primary Care Provider: Jenna Luo Other Clinician: Referring Provider: Jenna Luo Treating Provider/Extender: Skipper Cliche in Treatment: 16 Active Problems ICD-10 Encounter Code Description Active Date MDM Diagnosis E83.50 Unspecified disorder of calcium metabolism 05/24/2021 No Yes L97.822 Non-pressure chronic ulcer of other part of left lower leg with fat layer 05/24/2021 No Yes exposed L98.492 Non-pressure chronic ulcer of skin of other sites with fat layer exposed 06/07/2021 No Yes Z99.2 Dependence on renal dialysis 05/24/2021 No Yes N18.4 Chronic kidney disease, stage 4 (severe) 05/24/2021 No Yes I25.10 Atherosclerotic heart disease of native coronary artery without angina 05/24/2021 No Yes pectoris Inactive Problems Resolved Problems Electronic Signature(s) Signed: 09/13/2021 5:14:54 PM By: Worthy Keeler PA-C Signed: 09/14/2021 9:21:59 AM By: Carlene Coria RN Previous Signature: 09/13/2021 1:28:02 PM Version By: Worthy Keeler PA-C Entered By: Carlene Coria on 09/13/2021 14:03:41 Ferrando, Tanvir L. ([id number]) -------------------------------------------------------------------------------- Progress Note Details Patient Name: [name], [name] Date of Service: 09/13/2021 1:15 PM Medical Record Number: [id number] Patient Account Number: [id number] Date of Birth/Sex: 1941/02/19 (81 y.o. 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 This has been present for a couple of months currently he is on the sodium thiosulfate at dialysis. He tells me that they picked up on this quickly and have been managing it but he really has not had any help with anything else from the standpoint of progressing the wound itself. The patient does have again end-stage renal disease which currently is listed as stage IV but he is on renal dialysis. He also has coronary artery disease as well as going shortly for formal arterial studies although he did have a quick test of the tibial artery with his vascular doctor when he went in to check on his fistula for his arm and subsequently they did not feel like he had any hemodynamically unstable flow into the lower extremity this is good news. Nonetheless I think that the biggest issue here is going to be getting some of the eschar off so that we get the wounds med headed in the appropriate direction. 05/31/2021 upon evaluation today patient appears to be doing well with regard to his leg ulceration with calciphylaxis. I am very pleased in this regard. Fortunately there does not appear to be any signs of active infection at this time which is great news. No fevers, chills, nausea, vomiting, or diarrhea. 06/07/2021 upon evaluation today patient appears to be doing well with regard to his legs. Unfortunately he is having an issue here with his penis as well he did mention at the end of last visit. I suggested  that he go see urology. However when he went to see the urologist they basically told him there was not anything they could do. They recommended that we needed to be the ones to take care of this. With that being said this is an unusual area that to be honest  a lot of the traditional wound care products we use are not really good to be good for this region. Potentially Medihoney alginate could be a possibility also think Santyl could be a possibility. With that being said I think initially my suggestion is probably can be for [place] to attempt the Santyl to see if that will benefit the patient. 06/21/2021 upon evaluation today patient's wound bed actually showed signs of good granulation and epithelization at this point. Fortunately I do not see any signs of active infection systemically which is great news and his legs are doing awesome I think you are making great progress. The Santyl on the glans penis also has been of benefit this is looking tremendously better. Again the patient is pleased he also tells me the pain is significantly improved. Overall I think we are headed in the appropriate direction here. 07/05/2021 upon evaluation today patient appears to be doing well with regard to his wounds. Both the area on the tip of his penis as well as the left lateral leg is doing well. There is some debridement this can be needed over the leg region. Fortunately I think that overall the Dakin's moistened gauze has done a great job here. No fevers, chills, nausea, vomiting, or diarrhea. 07/12/2021 upon evaluation today patient presents for reevaluation here in the clinic and overall I think that his wounds are doing significantly better which is great news. This includes both the left leg as well as the glans of the penis. Both are showing signs of significant improvement which is great and overall I think that we are headed in the right direction. I do not see any evidence of active infection at this moment. 07/19/2021 upon evaluation today patient appears to be doing better in regard to his wounds. Were getting much closer to complete resolution in regard to the necrotic tissue being removed from the leg. Overall I am extremely pleased with where we stand today.  Overall I do not see any signs of active infection at this time. With regard to the patient's penis this area is showing signs of improvement as well and I am very pleased with that. 07/26/2021 upon evaluation today patient appears to be doing well with regard to his wounds. The leg in fact is looking quite well and I am very pleased with where we stand today. I do not see any signs of infection which is great and there is a lot of new granulation growth also also. With regard to the penis area this also showed signs of excellent improvement which is great news. 08/14/2020 upon evaluation today patient appears to be doing well with regard to his wounds both the leg as well as the glans penis region. Both are showing signs of improvement which is great news and overall I am extremely pleased with where we stand at this point. I think that the Dakin's moistened gauze dressing for her leg is doing great and the Santyl is definitely helping with the glans penis location. 08/28/2021 upon evaluation today patient appears to be doing well with regard to his wounds on the left leg as well as the penis. Fortunately both are showing signs of improvement. I am  going to perform some debridement in regard to the leg there is a small area which is having trouble getting completely clean my work on that a little bit more today. 09/04/2021 upon evaluation today patient appears to be doing pretty well in regard to his wounds. 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 Again he has I just found out today been off of the sodium thiosulfate for about 2 to 3 weeks. He tells me that the initial "3 weeks" was stated to be up by the nurse. With that being said I think he probably is going to need it for a bit longer to be honest. We discussed that today. Over the penis area the Santyl does seem to be loose and a lot of this up  and that is good hopefully will be able to get this completely cleared off it sometime shortly. 09/13/2021 upon evaluation today patient appears to be doing well with regard to his wound. He has been tolerating the dressing changes without complication. Fortunately there does not appear to be any signs of active infection locally or systemically which is great news. Overall I think that he is making progress with regard to his wounds in general. [name], [name] ([id number]) Objective Constitutional Well-nourished and well-hydrated in no acute distress. Vitals Time Taken: 1:25 PM, Height: 68 in, Weight: 172 lbs, BMI: 26.1, Temperature: 98.2 F, Pulse: 74 bpm, Respiratory Rate: 18 breaths/min, Blood Pressure: 149/82 mmHg. Respiratory normal breathing without difficulty. Psychiatric this patient is able to make decisions and demonstrates good insight into disease process. Alert and Oriented x 3. pleasant and cooperative. General Notes: Upon inspection patient's wounds are actually showing signs of significant improvement. I did discuss with his nephrologist reinitiating the sodium thiosulfate last week he called me in [place] on Wednesday. Nonetheless they are going go ahead and read up this for a short amount of time in order to hopefully get him through the last of this flareup on the leg. Hopefully after that will be at the point where we can just cruise forward and allow this to heal appropriately. Integumentary (Hair, Skin) Wound #1 status is Open. Original cause of wound was Gradually Appeared. The date acquired was: 04/05/2021. The wound has been in treatment 16 weeks. The wound is located on the Left,Posterior Lower Leg. The wound measures 20cm length x 14cm width x 0.2cm depth; 219.911cm^2 area and 43.982cm^3 volume. There is Fat Layer (Subcutaneous Tissue) exposed. There is no tunneling or undermining noted. There is a medium amount of serosanguineous drainage noted. There is medium  (34-66%) red, pink granulation within the wound bed. There is a small (1-33%) amount of necrotic tissue within the wound bed including Eschar and Adherent Slough. Wound #2 status is Open. Original cause of wound was Gradually Appeared. The date acquired was: 05/05/2021. The wound has been in treatment 14 weeks. The wound is located on the Penis. The wound measures 0.3cm length x 0.3cm width x 0.1cm depth; 0.071cm^2 area and 0.007cm^3 volume. There is Fat Layer (Subcutaneous Tissue) exposed. There is no tunneling or undermining noted. There is a medium amount of serosanguineous drainage noted. There is medium (34-66%) granulation within the wound bed. There is a medium (34-66%) amount of necrotic tissue within the wound bed including Eschar and Adherent Slough. 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 Edema Control - Lymphedema / Segmental Compressive Device / Other: Elevate, Exercise Daily and Avoid Standing for Long Periods of Time. Elevate legs to the level of the heart and pump ankles as often as possible Elevate leg(s) parallel to the floor when sitting. WOUND #1: - Lower Leg Wound Laterality: Left, Posterior Cleanser: Byram Ancillary Kit - 15 Day Supply (Generic) 1 x Per Day/30 Days Discharge Instructions: Use supplies as instructed; Kit contains: (15) Saline Bullets; (15) 3x3 Gauze; 15 pr Gloves Primary Dressing: Kerlix AMD Roll Dressing, 4.5x 4.1 (in/in) (Generic) 1 x Per Day/30 Days Discharge Instructions: moisten with [name], [name] ([id number]) Secondary Dressing: Kerlix 4.5  x 4.1 (in/yd) (Generic) 1 x Per Day/30 Days Discharge Instructions: Apply Kerlix 4.5 x 4.1 (in/yd) as instructed Secured With: 18M Medipore H Soft Cloth Surgical Tape, 2x2 (in/yd) (Generic) 1 x Per Day/30 Days WOUND #2: - Penis Wound Laterality: Cleanser: Byram Ancillary Kit - 15 Day Supply (Generic) 1 x Per Day/30 Days Discharge Instructions: Use supplies as instructed; Kit contains: (15) Saline Bullets; (15) 3x3 Gauze; 15 pr Gloves Primary Dressing: Gauze (Generic) 1 x Per Day/30 Days Discharge Instructions: moistened with santyl Primary Dressing: Santyl Collagenase Ointment, 30 (gm), tube 1 x Per Day/30 Days 1. Would recommend currently that we going to continue with the wound care measures as before the patient is in agreement with plan. This includes the use of the Dakin's moistened gauze dressing to the leg which I think is doing a good job. 2. Also can recommend that we have the patient continue with the Santyl to the penis area which is doing well. 3. I would also recommend that we continue to change these daily and hopefully the patient will continue to show signs of good improvement. We will see patient back for reevaluation in 1 week here in the clinic. If anything worsens or changes patient will contact our office for additional recommendations. Electronic Signature(s) Signed: 09/13/2021 2:30:27 PM By: Worthy Keeler PA-C Entered By: Worthy Keeler on 09/13/2021 14:30:26 [name], [name] ([id number]) -------------------------------------------------------------------------------- SuperBill Details Patient Name: Mark Morale L. Date of Service: 09/13/2021 Medical Record Number: [id number] Patient Account Number: [id number] Date of Birth/Sex: 04/23/1941 (81 y.o. 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 Facility Procedures CPT4 Code: 26203559 Description: 99213 - WOUND CARE VISIT-LEV 3 EST PT Modifier: Quantity: 1 Physician Procedures CPT4 Code: 7416384 Description: 99214 - WC PHYS LEVEL 4 - EST PT Modifier: Quantity: 1 CPT4 Code: Description: ICD-10 Diagnosis Description 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

## 2021-09-14 NOTE — Progress Notes (Signed)
Mark, [name] ([id number]) Visit Report for 09/13/2021 Arrival Information Details Patient Name: [name], [name]. Date of Service: 09/13/2021 1:15 PM Medical Record Number: [id number] Patient Account Number: [id number] Date of Birth/Sex: 12/07/1940 (81 y.o. M) Treating RN: Carlene Coria Primary Care Deovion Batrez: Jenna Luo Other Clinician: Referring Chyanne Kohut: Jenna Luo Treating Moritz Lever/Extender: Skipper Cliche in Treatment: 50 Visit Information History Since Last Visit All ordered tests and consults were completed: No Patient Arrived: [name] Added or deleted any medications: No Arrival Time: 13:20 Any new allergies or adverse reactions: No Accompanied By: wife Had a fall or experienced change in No Transfer Assistance: None activities of daily living that may affect Patient Identification Verified: Yes risk of falls: Secondary Verification Process Completed: Yes Signs or symptoms of abuse/neglect since last visito No Patient Requires Transmission-Based Precautions: No Hospitalized since last visit: No Patient Has Alerts: No Implantable device outside of the clinic excluding No cellular tissue based products placed in the center since last visit: Has Dressing in Place as Prescribed: Yes Pain Present Now: No Electronic Signature(s) Signed: 09/14/2021 9:21:59 AM By: Carlene Coria RN Entered By: Carlene Coria on 09/13/2021 13:24:59 Goto, [name] ([id number]) -------------------------------------------------------------------------------- Clinic Level of Care Assessment Details Patient Name: Mark Morale L. Date of Service: 09/13/2021 1:15 PM Medical Record Number: [id number] Patient Account Number: [id number] Date of Birth/Sex: January 18, 1941 (81 y.o. M) Treating RN: Carlene Coria Primary Care Jameya Pontiff: Jenna Luo Other Clinician: Referring Avyan Livesay: Jenna Luo Treating Alvira Hecht/Extender: Skipper Cliche in Treatment: 16 Clinic Level of Care Assessment  Items TOOL 4 Quantity Score X - Use when only an EandM is performed on FOLLOW-UP visit 1 0 ASSESSMENTS - Nursing Assessment / Reassessment X - Reassessment of Co-morbidities (includes updates in patient status) 1 10 X- 1 5 Reassessment of Adherence to Treatment Plan ASSESSMENTS - Wound and Skin Assessment / Reassessment []  - Simple Wound Assessment / Reassessment - one wound 0 X- 2 5 Complex Wound Assessment / Reassessment - multiple wounds []  - 0 Dermatologic / Skin Assessment (not related to wound area) ASSESSMENTS - Focused Assessment []  - Circumferential Edema Measurements - multi extremities 0 []  - 0 Nutritional Assessment / Counseling / Intervention []  - 0 Lower Extremity Assessment (monofilament, tuning fork, pulses) []  - 0 Peripheral Arterial Disease Assessment (using hand held doppler) ASSESSMENTS - Ostomy and/or Continence Assessment and Care []  - Incontinence Assessment and Management 0 []  - 0 Ostomy Care Assessment and Management (repouching, etc.) PROCESS - Coordination of Care X - Simple Patient / Family Education for ongoing care 1 15 []  - 0 Complex (extensive) Patient / Family Education for ongoing care []  - 0 Staff obtains Programmer, systems, Records, Test Results / Process Orders []  - 0 Staff telephones HHA, Nursing Homes / Clarify orders / etc []  - 0 Routine Transfer to another Facility (non-emergent condition) []  - 0 Routine Hospital Admission (non-emergent condition) []  - 0 New Admissions / Biomedical engineer / Ordering NPWT, Apligraf, etc. []  - 0 Emergency Hospital Admission (emergent condition) X- 1 10 Simple Discharge Coordination []  - 0 Complex (extensive) Discharge Coordination PROCESS - Special Needs []  - Pediatric / Minor Patient Management 0 []  - 0 Isolation Patient Management []  - 0 Hearing / Language / Visual special needs []  - 0 Assessment of Community assistance (transportation, D/C planning, etc.) []  - 0 Additional assistance /  Altered mentation []  - 0 Support Surface(s) Assessment (bed, cushion, seat, etc.) INTERVENTIONS - Wound Cleansing / Measurement [name], [name] ([id number]) []  - 0 Simple Wound Cleansing - one  wound X- 2 5 Complex Wound Cleansing - multiple wounds X- 1 5 Wound Imaging (photographs - any number of wounds) []  - 0 Wound Tracing (instead of photographs) []  - 0 Simple Wound Measurement - one wound X- 2 5 Complex Wound Measurement - multiple wounds INTERVENTIONS - Wound Dressings X - Small Wound Dressing one or multiple wounds 1 10 []  - 0 Medium Wound Dressing one or multiple wounds X- 1 20 Large Wound Dressing one or multiple wounds []  - 0 Application of Medications - topical []  - 0 Application of Medications - injection INTERVENTIONS - Miscellaneous []  - External ear exam 0 []  - 0 Specimen Collection (cultures, biopsies, blood, body fluids, etc.) []  - 0 Specimen(s) / Culture(s) sent or taken to Lab for analysis []  - 0 Patient Transfer (multiple staff / Civil Service fast streamer / Similar devices) []  - 0 Simple Staple / Suture removal (25 or less) []  - 0 Complex Staple / Suture removal (26 or more) []  - 0 Hypo / Hyperglycemic Management (close monitor of Blood Glucose) []  - 0 Ankle / Brachial Index (ABI) - do not check if billed separately X- 1 5 Vital Signs Has the patient been seen at the hospital within the last three years: Yes Total Score: 110 Level Of Care: New/Established - Level 3 Electronic Signature(s) Signed: 09/14/2021 9:21:59 AM By: Carlene Coria RN Entered By: Carlene Coria on 09/13/2021 14:03:11 Glock, Vicente L. ([id number]) -------------------------------------------------------------------------------- Encounter Discharge Information Details Patient Name: Mark Morale L. Date of Service: 09/13/2021 1:15 PM Medical Record Number: [id number] Patient Account Number: [id number] Date of Birth/Sex: [birth year] (81 y.o. M) Treating RN: Carlene Coria Primary Care  Makhia Vosler: Jenna Luo Other Clinician: Referring Riki Gehring: Jenna Luo Treating Daneil Beem/Extender: Skipper Cliche in Treatment: 16 Encounter Discharge Information Items Discharge Condition: Stable Ambulatory Status: Walker Discharge Destination: Home Transportation: Private Auto Accompanied By: wife Schedule Follow-up Appointment: Yes Clinical Summary of Care: Patient Declined Electronic Signature(s) Signed: 09/14/2021 9:21:59 AM By: Carlene Coria RN Entered By: Carlene Coria on 09/13/2021 14:04:43 [place]kill, Zair L. ([id number]) -------------------------------------------------------------------------------- Lower Extremity Assessment Details Patient Name: Roseland, Davion L. Date of Service: 09/13/2021 1:15 PM Medical Record Number: [id number] Patient Account Number: [id number] Date of Birth/Sex: 09/13/1940 (81 y.o. M) Treating RN: Carlene Coria Primary Care Kiev Labrosse: Jenna Luo Other Clinician: Referring Arling Cerone: Jenna Luo Treating Tyheem Boughner/Extender: Skipper Cliche in Treatment: 16 Edema Assessment Assessed: [Left: No] [Right: No] Edema: [Left: Ye] [Right: s] Calf Left: Right: Point of Measurement: 34 cm From Medial Instep 30 cm Ankle Left: Right: Point of Measurement: 9 cm From Medial Instep 19 cm Vascular Assessment Pulses: Dorsalis Pedis Palpable: [Left:Yes] Electronic Signature(s) Signed: 09/14/2021 9:21:59 AM By: Carlene Coria RN Entered By: Carlene Coria on 09/13/2021 13:39:31 Smart, [place] ([id number]) -------------------------------------------------------------------------------- Multi Wound Chart Details Patient Name: Mark Morale L. Date of Service: 09/13/2021 1:15 PM Medical Record Number: [id number] Patient Account Number: [id number] Date of Birth/Sex: 1941-06-03 (81 y.o. M) Treating RN: Carlene Coria Primary Care Bonniejean Piano: Jenna Luo Other Clinician: Referring Khila Papp: Jenna Luo Treating Alexsis Branscom/Extender: Skipper Cliche in Treatment: 16 Vital Signs Height(in): 68 Pulse(bpm): 74 Weight(lbs): 172 Blood Pressure(mmHg): 149/82 Body Mass Index(BMI): 26.1 Temperature(F): 98.2 Respiratory Rate(breaths/min): 18 Photos: [N/A:N/A] Wound Location: Left, Posterior Lower Leg Penis N/A Wounding Event: Gradually Appeared Gradually Appeared N/A Primary Etiology: Calciphylaxis Calciphylaxis N/A Comorbid History: Coronary Artery Disease, Type II Coronary Artery Disease, Type II N/A Diabetes Diabetes Date Acquired: 04/05/2021 05/05/2021 N/A Weeks of Treatment: 16 14 N/A Wound Status: Open Open N/A Wound Recurrence: No No  N/A Clustered Wound: Yes No N/A Measurements L x W x D (cm) 20x14x0.2 0.3x0.3x0.1 N/A Area (cm) : 219.911 0.071 N/A Volume (cm) : 43.982 0.007 N/A % Reduction in Area: -27.30% 98.50% N/A % Reduction in Volume: 36.40% 98.50% N/A Classification: Full Thickness Without Exposed Full Thickness Without Exposed N/A Support Structures Support Structures Exudate Amount: Medium Medium N/A Exudate Type: Serosanguineous Serosanguineous N/A Exudate Color: red, brown red, brown N/A Granulation Amount: Medium (34-66%) Medium (34-66%) N/A Granulation Quality: Red, Pink N/A N/A Necrotic Amount: Small (1-33%) Medium (34-66%) N/A Necrotic Tissue: Eschar, Adherent Nelson, Adherent Slough N/A Exposed Structures: Fat Layer (Subcutaneous Tissue): Fat Layer (Subcutaneous Tissue): N/A Yes Yes Fascia: No Fascia: No Tendon: No Tendon: No Muscle: No Muscle: No Joint: No Joint: No Bone: No Bone: No Epithelialization: None None N/A Treatment Notes Electronic Signature(s) Signed: 09/14/2021 9:21:59 AM By: Carlene Coria RN Entered By: Carlene Coria on 09/13/2021 14:01:49 [place], [place] ([id number]) Neumeier, Verdie Carlean Jews ([id number]) -------------------------------------------------------------------------------- [place] Details Patient Name: Mark Morale L. Date of  Service: 09/13/2021 1:15 PM Medical Record Number: [id number] Patient Account Number: [id number] Date of Birth/Sex: 1941-02-21 (81 y.o. M) Treating RN: Carlene Coria Primary Care Taccara Bushnell: Jenna Luo Other Clinician: Referring Magnum Lunde: Jenna Luo Treating Wallis Vancott/Extender: Skipper Cliche in Treatment: 16 Active Inactive Wound/Skin Impairment Nursing Diagnoses: Knowledge deficit related to ulceration/compromised skin integrity Goals: Patient/caregiver will verbalize understanding of skin care regimen Date Initiated: 05/24/2021 Target Resolution Date: 09/14/2021 Goal Status: Active Ulcer/skin breakdown will have a volume reduction of 30% by week 4 Date Initiated: 05/24/2021 Date Inactivated: 08/14/2021 Target Resolution Date: 07/24/2021 Goal Status: Unmet Unmet Reason: comorbites Ulcer/skin breakdown will have a volume reduction of 50% by week 8 Date Initiated: 05/24/2021 Target Resolution Date: 08/24/2021 Goal Status: Active Ulcer/skin breakdown will have a volume reduction of 80% by week 12 Date Initiated: 05/24/2021 Target Resolution Date: 09/24/2021 Goal Status: Active Ulcer/skin breakdown will heal within 14 weeks Date Initiated: 05/24/2021 Target Resolution Date: 10/22/2021 Goal Status: Active Interventions: Assess patient/caregiver ability to obtain necessary supplies Assess patient/caregiver ability to perform ulcer/skin care regimen upon admission and as needed Assess ulceration(s) every visit Notes: Electronic Signature(s) Signed: 09/14/2021 9:21:59 AM By: Carlene Coria RN Entered By: Carlene Coria on 09/13/2021 14:01:07 Hoffart, Lorene L. ([id number]) -------------------------------------------------------------------------------- Pain Assessment Details Patient Name: Mark Morale L. Date of Service: 09/13/2021 1:15 PM Medical Record Number: [id number] Patient Account Number: [id number] Date of Birth/Sex: [birth year] (81 y.o. M) Treating RN: Carlene Coria Primary Care Sahid Borba: Jenna Luo Other Clinician: Referring Contina Strain: Jenna Luo Treating Josslyn Ciolek/Extender: Skipper Cliche in Treatment: 16 Active Problems Location of Pain Severity and Description of Pain Patient Has Paino No Site Locations Pain Management and Medication Current Pain Management: Electronic Signature(s) Signed: 09/14/2021 9:21:59 AM By: Carlene Coria RN Entered By: Carlene Coria on 09/13/2021 13:26:13 [place], [place]. ([id number]) -------------------------------------------------------------------------------- Patient/Caregiver Education Details Patient Name: Mark Morale L. Date of Service: 09/13/2021 1:15 PM Medical Record Number: [id number] Patient Account Number: [id number] Date of Birth/Gender: 1941/04/13 (81 y.o. M) Treating RN: Carlene Coria Primary Care Physician: Jenna Luo Other Clinician: Referring Physician: Jenna Luo Treating Physician/Extender: Skipper Cliche in Treatment: 16 Education Assessment Education Provided To: Patient Education Topics Provided Wound/Skin Impairment: Methods: Explain/Verbal Responses: State content correctly [employer]) Signed: 09/14/2021 9:21:59 AM By: Carlene Coria RN Entered By: Carlene Coria on 09/13/2021 14:03:31 Perkins, Eissa L. ([id number]) -------------------------------------------------------------------------------- Wound Assessment Details Patient Name: Carne[place], Llewelyn L. Date of Service: 09/13/2021 1:15 PM Medical Record Number: [id number] Patient Account Number:  [id number] Date of Birth/Sex: August 12, 1940 (81 y.o. M) Treating RN: Carlene Coria Primary Care Hunter Pinkard: Jenna Luo Other Clinician: Referring David Towson: Jenna Luo Treating Kendarrius Tanzi/Extender: Skipper Cliche in Treatment: 16 Wound Status Wound Number: 1 Primary Etiology: Calciphylaxis Wound Location: Left, Posterior Lower Leg Wound Status: Open Wounding Event: Gradually Appeared Comorbid  History: Coronary Artery Disease, Type II Diabetes Date Acquired: 04/05/2021 Weeks Of Treatment: 16 Clustered Wound: Yes Photos Wound Measurements Length: (cm) 20 Width: (cm) 14 Depth: (cm) 0.2 Area: (cm) 219.911 Volume: (cm) 43.982 % Reduction in Area: -27.3% % Reduction in Volume: 36.4% Epithelialization: None Tunneling: No Undermining: No Wound Description Classification: Full Thickness Without Exposed Support Structures Exudate Amount: Medium Exudate Type: Serosanguineous Exudate Color: red, brown Foul Odor After Cleansing: No Slough/Fibrino Yes Wound Bed Granulation Amount: Medium (34-66%) Exposed Structure Granulation Quality: Red, Pink Fascia Exposed: No Necrotic Amount: Small (1-33%) Fat Layer (Subcutaneous Tissue) Exposed: Yes Necrotic Quality: Eschar, Adherent Slough Tendon Exposed: No Muscle Exposed: No Joint Exposed: No Bone Exposed: No Treatment Notes Wound #1 (Lower Leg) Wound Laterality: Left, Posterior Cleanser Byram Ancillary Kit - 15 Day Supply Discharge Instruction: Use supplies as instructed; Kit contains: (15) Saline Bullets; (15) 3x3 Gauze; 15 pr Gloves Peri-Wound Care [place], [place] ([id number]) Topical Primary Dressing Kerlix AMD Roll Dressing, 4.5x 4.1 (in/in) Discharge Instruction: moisten with dakins Secondary Dressing Kerlix 4.5 x 4.1 (in/yd) Discharge Instruction: Apply Kerlix 4.5 x 4.1 (in/yd) as instructed Secured With 37M Medipore H Soft Cloth Surgical Tape, 2x2 (in/yd) Compression Wrap Compression Stockings Add-Ons Electronic Signature(s) Signed: 09/14/2021 9:21:59 AM By: Carlene Coria RN Entered By: Carlene Coria on 09/13/2021 13:38:31 [place], Rollyn L. ([id number]) -------------------------------------------------------------------------------- Wound Assessment Details Patient Name: Lull, Kiara L. Date of Service: 09/13/2021 1:15 PM Medical Record Number: [id number] Patient Account Number: [id number] Date of Birth/Sex:  08/04/1941 (81 y.o. M) Treating RN: Carlene Coria Primary Care Alexanderia Gorby: Jenna Luo Other Clinician: Referring Rayah Fines: Jenna Luo Treating Darlisa Spruiell/Extender: Skipper Cliche in Treatment: 16 Wound Status Wound Number: 2 Primary Etiology: Calciphylaxis Wound Location: Penis Wound Status: Open Wounding Event: Gradually Appeared Comorbid History: Coronary Artery Disease, Type II Diabetes Date Acquired: 05/05/2021 Weeks Of Treatment: 14 Clustered Wound: No Photos Wound Measurements Length: (cm) 0.3 Width: (cm) 0.3 Depth: (cm) 0.1 Area: (cm) 0.071 Volume: (cm) 0.007 % Reduction in Area: 98.5% % Reduction in Volume: 98.5% Epithelialization: None Tunneling: No Undermining: No Wound Description Classification: Full Thickness Without Exposed Support Structures Exudate Amount: Medium Exudate Type: Serosanguineous Exudate Color: red, brown Foul Odor After Cleansing: No Slough/Fibrino Yes Wound Bed Granulation Amount: Medium (34-66%) Exposed Structure Necrotic Amount: Medium (34-66%) Fascia Exposed: No Necrotic Quality: Eschar, Adherent Slough Fat Layer (Subcutaneous Tissue) Exposed: Yes Tendon Exposed: No Muscle Exposed: No Joint Exposed: No Bone Exposed: No Treatment Notes Wound #2 (Penis) Cleanser Byram Ancillary Kit - 15 Day Supply Discharge Instruction: Use supplies as instructed; Kit contains: (15) Saline Bullets; (15) 3x3 Gauze; 15 pr Gloves Peri-Wound Care Markie, [place] ([id number]) Topical Primary Dressing Gauze Discharge Instruction: moistened with santyl Santyl Collagenase Ointment, 30 (gm), tube Secondary Dressing Secured With Compression Wrap Compression Stockings Add-Ons Electronic Signature(s) Signed: 09/14/2021 9:21:59 AM By: Carlene Coria RN Entered By: Carlene Coria on 09/13/2021 13:38:54 Wronski, Avigdor L. ([id number]) -------------------------------------------------------------------------------- Vitals Details Patient Name:  Mark Morale L. Date of Service: 09/13/2021 1:15 PM Medical Record Number: [id number] Patient Account Number: [id number] Date of Birth/Sex: 1940-09-23 (81 y.o. M) Treating RN: Carlene Coria Primary Care Vietta Bonifield: Jenna Luo Other Clinician: Referring Jhan Conery: Jenna Luo Treating Rees Santistevan/Extender: Joaquim Lai,  [place] Weeks in Treatment: 16 Vital Signs Time Taken: 13:25 Temperature (F): 98.2 Height (in): 68 Pulse (bpm): 74 Weight (lbs): 172 Respiratory Rate (breaths/min): 18 Body Mass Index (BMI): 26.1 Blood Pressure (mmHg): 149/82 Reference Range: 80 - 120 mg / dl Electronic Signature(s) Signed: 09/14/2021 9:21:59 AM By: Carlene Coria RN Entered By: Carlene Coria on 09/13/2021 13:25:43

## 2021-09-17 DIAGNOSIS — N2581 Secondary hyperparathyroidism of renal origin: Secondary | ICD-10-CM | POA: Diagnosis not present

## 2021-09-17 DIAGNOSIS — Z992 Dependence on renal dialysis: Secondary | ICD-10-CM | POA: Diagnosis not present

## 2021-09-17 DIAGNOSIS — D689 Coagulation defect, unspecified: Secondary | ICD-10-CM | POA: Diagnosis not present

## 2021-09-17 DIAGNOSIS — N186 End stage renal disease: Secondary | ICD-10-CM | POA: Diagnosis not present

## 2021-09-17 DIAGNOSIS — E1122 Type 2 diabetes mellitus with diabetic chronic kidney disease: Secondary | ICD-10-CM | POA: Diagnosis not present

## 2021-09-18 ENCOUNTER — Ambulatory Visit
Payer: PPO | Attending: Student in an Organized Health Care Education/Training Program | Admitting: Student in an Organized Health Care Education/Training Program

## 2021-09-18 ENCOUNTER — Encounter: Payer: Self-pay | Admitting: Student in an Organized Health Care Education/Training Program

## 2021-09-18 ENCOUNTER — Other Ambulatory Visit: Payer: Self-pay

## 2021-09-18 VITALS — BP 116/58 | HR 70 | Temp 98.4°F | Resp 16 | Ht 68.0 in | Wt 167.0 lb

## 2021-09-18 DIAGNOSIS — N186 End stage renal disease: Secondary | ICD-10-CM

## 2021-09-18 DIAGNOSIS — Z0289 Encounter for other administrative examinations: Secondary | ICD-10-CM | POA: Diagnosis not present

## 2021-09-18 DIAGNOSIS — L97923 Non-pressure chronic ulcer of unspecified part of left lower leg with necrosis of muscle: Secondary | ICD-10-CM | POA: Diagnosis not present

## 2021-09-18 DIAGNOSIS — E1142 Type 2 diabetes mellitus with diabetic polyneuropathy: Secondary | ICD-10-CM | POA: Insufficient documentation

## 2021-09-18 DIAGNOSIS — Z794 Long term (current) use of insulin: Secondary | ICD-10-CM | POA: Insufficient documentation

## 2021-09-18 DIAGNOSIS — G894 Chronic pain syndrome: Secondary | ICD-10-CM | POA: Diagnosis not present

## 2021-09-18 MED ORDER — HYDROCODONE-ACETAMINOPHEN 10-325 MG PO TABS: 1.0000 | ORAL_TABLET | Freq: Four times a day (QID) | ORAL | 0 refills | Status: DC | PRN

## 2021-09-18 NOTE — Progress Notes (Signed)
Safety precautions to be maintained throughout the outpatient stay will include: orient to surroundings, keep bed in low position, maintain call bell within reach at all times, provide assistance with transfer out of bed and ambulation.  

## 2021-09-18 NOTE — Progress Notes (Signed)
PROVIDER NOTE: Information contained herein reflects review and annotations entered in association with encounter. Interpretation of such information and data should be left to medically-trained personnel. Information provided to patient can be located elsewhere in the medical record under "Patient Instructions". Document created using STT-dictation technology, any transcriptional errors that may result from process are unintentional.    Patient: Mark Jumbo Sr.  Service Category: E/M  Provider: [name], MD  DOB: 1941/06/08  DOS: 09/18/2021  Specialty: Interventional Pain Management  MRN: [id number]  Setting: Ambulatory outpatient  PCP: [name], MD  Type: Established Patient    Referring Provider: [name], MD  Location: Office  Delivery: Face-to-face     Primary Reason(s) for Visit: Encounter for evaluation before starting new chronic pain management plan of care (Level of risk: moderate) CC: Leg Pain (Left )  HPI  [name] is a 81 y.o. year old, male patient, who comes today for a follow-up evaluation to review the test results and decide on a treatment plan. He has Shortness of breath; Coronary atherosclerosis of native coronary artery; Type 2 diabetes mellitus ([place]); Essential hypertension, benign; Mixed hyperlipidemia; ED (erectile dysfunction); Malignant neoplasm of prostate ([place]); Diabetic retinopathy ([place]); NAFLD (nonalcoholic fatty liver disease); Peripheral edema; DM type 2 with diabetic peripheral neuropathy ([place]); Uncontrolled type 2 diabetes mellitus with hyperglycemia ([place]); Hyperkalemia; Fever, unknown origin; Altered mental state; Acute on chronic renal failure ([place]); Pancreatic insufficiency; Wears hearing aid; HOH (hard of hearing); AKI (acute kidney injury) ([place]); Acute renal failure superimposed on stage 3b chronic kidney disease ([place]); Acute metabolic encephalopathy; 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 Faecalis and Staph Aureus UTI; Hypocalcemia; Nausea; Pain management contract signed; Calciphylaxis of left lower extremity with nonhealing ulcer with necrosis of muscle ([place]); and Chronic pain syndrome on their problem list. His primarily concern today is the Leg Pain (Left )  Pain Assessment: Location: Left Leg Radiating: pain goes up and down the leg.  severe pain only last a few seconds but then he has underlying pain. Onset: More than a month ago Duration: Chronic pain Quality: Discomfort, Constant, Throbbing, Sharp, Aching Severity: 6 /10 (subjective, self-reported pain score)  Effect on ADL: affects him greatly, sleep disruption. Timing: Constant Modifying factors: elevates leg when sleeping, going to wound care for open lesions on his leg BP: (!) 116/58   HR: 70  [name] comes in today for a follow-up visit after his initial evaluation on 09/06/2021.   See HPI from initial clinic visit below: [name] is a very pleasant 81 year old male who presents with a chief complaint of left calf and left thigh pain related to calciphylaxis.  He has a history of end-stage renal disease and was previously dialysis.  He has severe pain in that region.  He has an open ulcer which I saw a picture of.  He has been taking hydrocodone 10 mg every 6 hours as needed which was recently reduced to 7.5 mg every 6 hours.  He states that the dose reduction to 7.5 mg has resulted in suboptimal pain management.  He is being referred here by his primary care provider.  Of note he has tried tramadol in the past which was not very effective.  He has also tried gabapentin in the past which resulted in cognitive dysfunction.  He did not have any side effects at his  previous dose of hydrocodone 10 mg every 6 hours as needed.  He was previously going to the wound  care clinic.  Patient also has insulin-dependent type 2 diabetes and has paresthesias of bilateral feet.    Controlled Substance Pharmacotherapy Assessment REMS (Risk Evaluation and Mitigation Strategy)  Opioid Analgesic: Hydrocodone 10 mg every 6 hours as needed, #120/month MME= 40.}  Pill Count: None expected due to no prior prescriptions written by our practice. Janett Billow, RN  09/18/2021 10:54 AM  Sign when Signing Visit Safety precautions to be maintained throughout the outpatient stay will include: orient to surroundings, keep bed in low position, maintain call bell within reach at all times, provide assistance with transfer out of bed and ambulation.  Pharmacokinetics: Liberation and absorption (onset of action): WNL Distribution (time to peak effect): WNL Metabolism and excretion (duration of action): WNL         Pharmacodynamics: Desired effects: Analgesia: [name] reports >50% benefit. Functional ability: Patient reports that medication allows him to accomplish basic ADLs Clinically meaningful improvement in function (CMIF): Sustained CMIF goals met Perceived effectiveness: Described as relatively effective, allowing for increase in activities of daily living (ADL) Undesirable effects: Side-effects or Adverse reactions: None reported Monitoring: [place] PMP: PDMP not reviewed this encounter. Online review of the past 46-monthperiod previously conducted. Not applicable at this point since we have not taken over the patient's medication management yet. List of other Serum/Urine Drug Screening Test(s):  Lab Results  Component Value Date   COCAINSCRNUR NONE DETECTED 12/02/2018   COCAINSCRNUR NONE DETECTED 07/30/2012   THCU NONE DETECTED 12/02/2018   THCU NONE DETECTED 07/30/2012   List of all UDS test(s) done:  Lab Results  Component Value Date   SUMMARY Note 09/06/2021   Last UDS on record: Summary  Date Value Ref Range Status  09/06/2021 Note  Final     Comment:    ==================================================================== [employer]ox[employer] Select 13 (MW) ==================================================================== Test                             Result       Flag       Units  Drug Present and Declared for Prescription Verification   Hydrocodone                    >>37628      EXPECTED   ng/mg creat   Hydromorphone                  1019         EXPECTED   ng/mg creat   Dihydrocodeine                 1089         EXPECTED   ng/mg creat   Norhydrocodone                 7886         EXPECTED   ng/mg creat    Sources of hydrocodone include scheduled prescription medications.    Hydromorphone, dihydrocodeine and norhydrocodone are expected    metabolites of hydrocodone. Hydromorphone and dihydrocodeine are    also available as scheduled prescription medications.  Drug Present not Declared for Prescription Verification   Desmethyldiazepam              40           UNEXPECTED ng/mg creat   Oxazepam  194          UNEXPECTED ng/mg creat    Desmethyldiazepam and oxazepam are benzodiazepine drugs, but may    also be present as common metabolites of other benzodiazepine drugs,    including diazepam, chlordiazepoxide, prazepam, clorazepate, and    halazepam.  ==================================================================== Test                      Result    Flag   Units      Ref Range   Creatinine              63               mg/dL      >=20 ==================================================================== Declared Medications:  The flagging and interpretation on this report are based on the  following declared medications.  Unexpected results may arise from  inaccuracies in the declared medications.   **Note: The testing scope of this panel includes these medications:   Hydrocodone (Norco)   **Note: The testing scope of this panel does not include the  following reported medications:    Acetaminophen (Norco)  Aspirin  Calcitriol  Diltiazem  Insulin (NovoLog)  Metoprolol  Pancrelipase  Pravastatin  Sevelamer (Renvela)  Sodium Bicarbonate  Topical ==================================================================== For clinical consultation, please call [phone]. ====================================================================    UDS interpretation: No unexpected findings.          Medication Assessment Form: Patient introduced to form today Treatment compliance: Treatment may start today if patient agrees with proposed plan. Evaluation of compliance is not applicable at this point Risk Assessment Profile: Aberrant behavior: See initial evaluations. None observed or detected today Comorbid factors increasing risk of overdose: See initial evaluation. No additional risks detected today Opioid risk tool (ORT):  Opioid Risk  09/06/2021  Alcohol 0  Illegal Drugs 0  Rx Drugs 0  Alcohol 0  Illegal Drugs 0  Rx Drugs 0  Age between 16-45 years  0  History of Preadolescent Sexual Abuse 0  Psychological Disease 0  Depression 0  Opioid Risk Tool Scoring 0  Opioid Risk Interpretation Low Risk    ORT Scoring interpretation table:  Score <3 = Low Risk for SUD  Score between 4-7 = Moderate Risk for SUD  Score >8 = High Risk for Opioid Abuse   Risk of substance use disorder (SUD): Low  Risk Mitigation Strategies:  Patient opioid safety counseling: Completed today. Counseling provided to patient as per "Patient Counseling Document". Document signed by patient, attesting to counseling and understanding Patient-Prescriber Agreement (PPA): Obtained today.  Controlled substance notification to other providers: Written and sent today.  Pharmacologic Plan: Today we may be taking over the patient's pharmacological regimen. See below.             Laboratory Chemistry Profile   Renal Lab Results  Component Value Date   BUN 39 (H) 05/15/2021   CREATININE 3.00 (H)  05/15/2021   LABCREA 35.88 01/16/2021   BCR 19 04/12/2021   GFR 65.09 01/29/2017   GFRAA 42 (L) 12/30/2019   GFRNONAA 18 (L) 04/18/2021   GFRNONAA 17 (L) 04/18/2021   PROTEINUR 30 (A) 04/13/2021     Electrolytes Lab Results  Component Value Date   NA 136 05/15/2021   K 4.2 05/15/2021   CL 99 05/15/2021   CALCIUM 7.3 (L) 04/18/2021   CALCIUM 7.1 (L) 04/18/2021   MG 1.7 02/06/2021   PHOS 6.0 (H) 04/18/2021     Hepatic Lab Results  Component Value Date   AST 16 04/18/2021   ALT 9 04/18/2021   ALBUMIN 2.0 (L) 04/18/2021   ALBUMIN 2.0 (L) 04/18/2021   ALKPHOS 143 (H) 04/18/2021   LIPASE 21 12/23/2020   AMMONIA 50 12/23/2019     ID Lab Results  Component Value Date   SARSCOV2NAA NEGATIVE 04/13/2021   STAPHAUREUS POSITIVE (A) 04/16/2021   MRSAPCR NEGATIVE 04/16/2021     Bone Lab Results  Component Value Date   VD25OH 25.27 (L) 12/28/2020   TESTOFREE 43.1 (L) 02/28/2015   TESTOSTERONE 295 11/25/2017     Endocrine Lab Results  Component Value Date   GLUCOSE 160 (H) 05/15/2021   GLUCOSEU NEGATIVE 04/13/2021   HGBA1C 6.2 (H) 03/19/2021   TSH 1.477 01/04/2021   FREET4 0.93 01/04/2021   TESTOFREE 43.1 (L) 02/28/2015   TESTOSTERONE 295 11/25/2017   SHBG 41 02/28/2015     Neuropathy Lab Results  Component Value Date   VITAMINB12 634 01/04/2021   FOLATE 24.5 01/04/2021   HGBA1C 6.2 (H) 03/19/2021     CNS No results found for: COLORCSF, APPEARCSF, RBCCOUNTCSF, WBCCSF, POLYSCSF, LYMPHSCSF, EOSCSF, PROTEINCSF, GLUCCSF, JCVIRUS, CSFOLI, IGGCSF, LABACHR, ACETBL, LABACHR, ACETBL   Inflammation (CRP: Acute   ESR: Chronic) Lab Results  Component Value Date   CRP 14.5 (H) 01/09/2021   ESRSEDRATE 11 12/02/2018   LATICACIDVEN 1.2 04/13/2021     Rheumatology No results found for: RF, ANA, LABURIC, URICUR, LYMEIGGIGMAB, LYMEABIGMQN, HLAB27   Coagulation Lab Results  Component Value Date   INR 1.5 (H) 04/13/2021   LABPROT 18.0 (H) 04/13/2021   APTT 37 (H)  04/13/2021   PLT 209 04/18/2021   DDIMER 0.75 (H) 12/02/2018     Cardiovascular Lab Results  Component Value Date   BNP 700.0 (H) 04/13/2021   CKTOTAL 192 11/25/2017   TROPONINI <0.03 08/04/2018   HGB 10.9 (L) 05/15/2021   HCT 32.0 (L) 05/15/2021     Screening Lab Results  Component Value Date   SARSCOV2NAA NEGATIVE 04/13/2021   STAPHAUREUS POSITIVE (A) 04/16/2021   MRSAPCR NEGATIVE 04/16/2021     Cancer No results found for: CEA, CA125, LABCA2   Allergens No results found for: ALMOND, APPLE, ASPARAGUS, AVOCADO, BANANA, BARLEY, BASIL, BAYLEAF, GREENBEAN, LIMABEAN, WHITEBEAN, BEEFIGE, REDBEET, BLUEBERRY, BROCCOLI, CABBAGE, MELON, CARROT, CASEIN, CASHEWNUT, CAULIFLOWER, CELERY     Note: Lab results reviewed.  Recent Diagnostic Imaging Review   MR Lumbar Spine Wo Contrast  Narrative CLINICAL DATA:  Low back pain radiating down both legs for 20 years  EXAM: MRI LUMBAR SPINE WITHOUT CONTRAST  TECHNIQUE: Multiplanar, multisequence MR imaging of the lumbar spine was performed. No intravenous contrast was administered.  COMPARISON:  Radiography from 2 weeks ago  FINDINGS: Segmentation: Transitional S1 vertebra with rudimentary disc space at S1-2. This is based on the lowest ribs by radiography  Alignment:  Negative for listhesis.  Vertebrae: No fracture, evidence of discitis, or aggressive bone lesion. L2 hemangioma  Conus medullaris and cauda equina: Conus extends to the L1 level. Conus and cauda equina appear normal.  Paraspinal and other soft tissues: Atrophy of intrinsic back muscles and psoas on both sides.  Disc levels:  T12- L1: Mild disc narrowing and minimal bulging  L1-L2: Mild disc narrowing and bulging. Mild facet spurring. No impingement  L2-L3: Disc narrowing and endplate degeneration with endplate ridging and disc bulging. Mild facet spurring. No impingement  L3-L4: Disc narrowing and bulging with endplate ridging. Facet osteoarthritis and  ligamentum flavum thickening. Moderate spinal stenosis. Noncompressive bilateral foraminal  narrowing  L4-L5: Interbody fusion or ankylosis. There is prominent endplate ridging eccentric to the right with high-grade spinal stenosis. The right-sided canal is completely effaced. Facet osteoarthritis with spurring greater on the right. Moderate bilateral foraminal narrowing.  L5-S1:Facet osteoarthritis, severe on the right with bulky spurring and right foraminal/subarticular recess compression. Spinal stenosis is overall moderate to advanced.  IMPRESSION: 1. Transitional S1 vertebra. 2. Degenerative disease greatest at L3-4 to L5-S1 where there is compressive spinal stenosis. 3. L4-5 asymmetric right subarticular recess stenosis which is completely effaced. 4. L5-S1 advanced right foraminal and subarticular recess impingement with right L5 and S1 compression. 5. L4-5 intervertebral ankylosis.   Electronically Signed By: Monte Fantasia M.D. On: 03/29/2020 07:27    Narrative Clinical:  Lower back pain, getting worse, no trauma  X-rays were done of the lumbar spine, five views.  There is marked degenerative joint disease of the lumbar spine with decreased disc space at L5-S1 and no disc space at L4-L5 and decreased disc space at L3-L4.  No fracture is noted.  Anterior osteophytes are present.  Bone quality is good.  Bilateral total hips are present.  Impression: marked degenerative joint disease of the lumbar spine.  Electronically Signed Sanjuana Kava, MD 8/10/20212:11 PM  Narrative CLINICAL DATA:  The patient tripped and fell this morning with a right wrist injury and pain. Initial encounter.  EXAM: RIGHT WRIST - COMPLETE 3+ VIEW  COMPARISON:  None.  FINDINGS: No acute bony or joint abnormality is identified. No notable arthropathy is seen. Atherosclerotic calcifications are noted.  IMPRESSION: No acute abnormality.   Electronically Signed By: Inge Rise  M.D. On: 04/07/2015 07:33   Complexity Note: Imaging results reviewed. Results shared with [name], using Layman's terms.                         Meds   Current Outpatient Medications:    aspirin EC 81 MG tablet, Take 81 mg by mouth at bedtime. , Disp: , Rfl:    B Complex-C-Zn-Folic Acid (DIALYVITE 270 WITH ZINC) 0.8 MG TABS, Take 1 tablet by mouth daily., Disp: , Rfl:    cinacalcet (SENSIPAR) 30 MG tablet, SMARTSIG:1 Tablet(s) By Mouth Every Evening, Disp: , Rfl:    diltiazem (CARDIZEM CD) 240 MG 24 hr capsule, Take 240 mg by mouth daily., Disp: , Rfl:    [START ON 10/08/2021] HYDROcodone-acetaminophen (NORCO) 10-325 MG tablet, Take 1 tablet by mouth every 6 (six) hours as needed for severe pain. Must last 30 days., Disp: 120 tablet, Rfl: 0   [START ON 11/07/2021] HYDROcodone-acetaminophen (NORCO) 10-325 MG tablet, Take 1 tablet by mouth every 6 (six) hours as needed for severe pain. Must last 30 days., Disp: 120 tablet, Rfl: 0   insulin aspart (NOVOLOG) 100 UNIT/ML injection, Inject 8 Units into the skin 3 (three) times daily with meals. (Patient taking differently: Inject 10 Units into the skin in the morning.), Disp: 10 mL, Rfl: 11   insulin glargine (LANTUS) 100 UNIT/ML injection, Inject 0.12 mLs (12 Units total) into the skin at bedtime. (Patient taking differently: Inject 12 Units into the skin in the morning.), Disp: 10 mL, Rfl: 11   lidocaine-prilocaine (EMLA) cream, APPLY SMALL AMOUNT TO ACCESS SITE (AVF) 30 MINUTES BEFORE DIALYSIS. COVER WITH OCCLUSIVE DRESSING (SARAN WRAP), Disp: , Rfl:    metoprolol tartrate (LOPRESSOR) 50 MG tablet, Take 1 tablet (50 mg total) by mouth 2 (two) times daily., Disp: 180 tablet, Rfl: 1   Pancrelipase,  Lip-Prot-Amyl, (ZENPEP) 10000-32000 units CPEP, Take 1 capsule by mouth 3 (three) times daily with meals., Disp: , Rfl:    pravastatin (PRAVACHOL) 10 MG tablet, TAKE 1 TABLET(10 MG) BY MOUTH DAILY WITH BREAKFAST, Disp: 90 tablet, Rfl: 1   sodium  thiosulfate 250 MG/ML injection, Inject 250 mLs into the vein every Monday, Wednesday, and Friday., Disp: , Rfl:   ROS  Constitutional: Denies any fever or chills Gastrointestinal: No reported hemesis, hematochezia, vomiting, or acute GI distress Musculoskeletal:  Left calf and left thigh pain Neurological: No reported episodes of acute onset apraxia, aphasia, dysarthria, agnosia, amnesia, paralysis, loss of coordination, or loss of consciousness  Allergies  [name] is allergic to enalapril maleate.  [place]  Drug: [name]  reports no history of drug use. Alcohol:  reports no history of alcohol use. Tobacco:  reports that he has never smoked. He has never used smokeless tobacco. Medical:  has a past medical history of Arthritis, Chronic back pain, Chronic kidney disease, Coronary atherosclerosis of native coronary artery, Deafness in right ear, Diabetes mellitus, Diabetic retinopathy ([place]), Diastolic dysfunction ([birth year]/1939), Essential hypertension, benign, Heart murmur, HOH (hard of hearing), Hyperkalemia, Kidney stones, Mixed hyperlipidemia, NAFLD (nonalcoholic fatty liver disease), Pancreatic insufficiency, Prostate cancer ([place]) (2011), Shortness of breath dyspnea, Subdural hematoma, Type 2 diabetes mellitus ([place]), and Wears hearing aid. Surgical: [name]  has a past surgical history that includes Prostatectomy (2011); Hernia repair; Appendectomy; Cholecystectomy; Bilateral hip replacement; Cataract extraction w/PHACO (Left, 05/15/2015); Cataract extraction w/PHACO (Right, 08/28/2015); Insertion of dialysis catheter (Right, 01/22/2021); Insertion of dialysis catheter (Right, 04/16/2021); and AV fistula placement (Left, 05/15/2021). Family: family history includes Diabetes type II in his mother; Heart attack in his father.  Constitutional Exam  General appearance: Well nourished, well developed, and well hydrated. In no apparent acute distress Vitals:   09/18/21 1047  BP: (!) 116/58   Pulse: 70  Resp: 16  Temp: 98.4 F (36.9 C)  TempSrc: Temporal  SpO2: 100%  Weight: 167 lb (75.8 kg)  Height: _0  (1.727 m)   BMI Assessment: Estimated body mass index is 25.39 kg/m as calculated from the following:   Height as of this encounter: _1  (1.727 m).   Weight as of this encounter: 167 lb (75.8 kg).  BMI interpretation table: BMI level Category Range association with higher incidence of chronic pain  <18 kg/m2 Underweight   18.5-24.9 kg/m2 Ideal body weight   25-29.9 kg/m2 Overweight Increased incidence by 20%  30-34.9 kg/m2 Obese (Class I) Increased incidence by 68%  35-39.9 kg/m2 Severe obesity (Class II) Increased incidence by 136%  >40 kg/m2 Extreme obesity (Class III) Increased incidence by 254%   Patient's current BMI Ideal Body weight  Body mass index is 25.39 kg/m. Ideal body weight: 68.4 kg (150 lb 12.7 oz) Adjusted ideal body weight: 71.3 kg (157 lb 4.4 oz)   BMI Readings from Last 4 Encounters:  09/18/21 25.39 kg/m  09/06/21 25.54 kg/m  08/16/21 25.85 kg/m  05/09/21 25.85 kg/m   Wt Readings from Last 4 Encounters:  09/18/21 167 lb (75.8 kg)  09/06/21 168 lb (76.2 kg)  08/16/21 170 lb (77.1 kg)  05/09/21 170 lb (77.1 kg)    Psych/Mental status: Alert, oriented x 3 (person, place, & time)       Eyes: PERLA Respiratory: No evidence of acute respiratory distress  Left lower extremity ulcer, erythematous rash, necrotic eschar Antalgic gait  Assessment & Plan  Primary Diagnosis & Pertinent Problem List: The primary encounter diagnosis was  Chronic pain syndrome. Diagnoses of Calciphylaxis of left lower extremity with nonhealing ulcer with necrosis of muscle ([place]), End stage renal disease ([place]), Pain management contract signed, and Type 2 diabetes mellitus with diabetic polyneuropathy, with long-term current use of insulin ([place]) were also pertinent to this visit.  Visit Diagnosis: 1. Chronic pain syndrome   2. Calciphylaxis of left lower  extremity with nonhealing ulcer with necrosis of muscle ([place])   3. End stage renal disease ([facility])   4. Pain management contract signed   5. Type 2 diabetes mellitus with diabetic polyneuropathy, with long-term current use of insulin ([facility])    Problems updated and reviewed during this visit: Problem  Pain Management Contract Signed  Calciphylaxis of Left Lower Extremity With Nonhealing Ulcer With Necrosis of Muscle ([facility])  Chronic Pain Syndrome  End Stage Renal Disease ([facility])    Plan of Care   Severe pain of left calf related to calciphylaxis.He has failed gabapentin and does not want to try anything that could result in cognitive dysfunction such as Lyrica or Cymbalta.  Avoid NSAIDs.  Can take acetaminophen as needed. Will take over Hydrocodone management as below.   Consider Qutenza capsaicin topical treatment for painful diabetic polyneuropathy paresthesias of his bilateral feet.   Consider lumbar spinal injections for low back pain and lumbar radicular pain secondary to foraminal stenosis and disc herniations as noted above.     Pharmacotherapy (Medications Ordered): Meds ordered this encounter  Medications   HYDROcodone-acetaminophen (NORCO) 10-325 MG tablet    Sig: Take 1 tablet by mouth every 6 (six) hours as needed for severe pain. Must last 30 days.    Dispense:  120 tablet    Refill:  0    Chronic Pain: STOP Act (Not applicable) Fill 1 day early if closed on refill date. Avoid benzodiazepines within 8 hours of opioids   HYDROcodone-acetaminophen (NORCO) 10-325 MG tablet    Sig: Take 1 tablet by mouth every 6 (six) hours as needed for severe pain. Must last 30 days.    Dispense:  120 tablet    Refill:  0    Chronic Pain: STOP Act (Not applicable) Fill 1 day early if closed on refill date. Avoid benzodiazepines within 8 hours of opioids   I spent a total of 30 minutes reviewing chart data, face-to-face evaluation with the patient, counseling and coordination of care as  detailed above.   Provider-requested follow-up: Return in about 2 months (around 11/29/2021) for Medication Management, in person. Recent Visits Date Type Provider Dept  09/06/21 Office Visit [name], MD [facility]  Showing recent visits within past 90 days and meeting all other requirements Today's Visits Date Type Provider Dept  09/18/21 Office Visit [name], MD [facility]  Showing today's visits and meeting all other requirements Future Appointments No visits were found meeting these conditions. Showing future appointments within next 90 days and meeting all other requirements  Primary Care Physician: [name], MD Note by: [name], MD Date: 09/18/2021; Time: 11:15 AM

## 2021-09-20 DIAGNOSIS — Z992 Dependence on renal dialysis: Secondary | ICD-10-CM | POA: Diagnosis not present

## 2021-09-20 DIAGNOSIS — I771 Stricture of artery: Secondary | ICD-10-CM | POA: Diagnosis not present

## 2021-09-20 DIAGNOSIS — N186 End stage renal disease: Secondary | ICD-10-CM | POA: Diagnosis not present

## 2021-09-20 NOTE — Progress Notes (Signed)
NICHOLA, WARREN ([id number]) Visit Report for 09/18/2021 Arrival Information Details Patient Name: [name], [name]. Date of Service: 09/18/2021 12:30 PM Medical Record Number: [id number] Patient Account Number: [id number] Date of Birth/Sex: 15-Aug-1940 (81 y.o. M) Treating RN: Carlene Coria Primary Care Tanzie Rothschild: Jenna Luo Other Clinician: Referring Khyree Carillo: Jenna Luo Treating Mikiala Fugett/Extender: Skipper Cliche in Treatment: 26 Visit Information History Since Last Visit All ordered tests and consults were completed: No Patient Arrived: [name] Added or deleted any medications: No Arrival Time: 12:35 Any new allergies or adverse reactions: No Accompanied By: wife Had a fall or experienced change in No Transfer Assistance: None activities of daily living that may affect Patient Identification Verified: Yes risk of falls: Secondary Verification Process Completed: Yes Signs or symptoms of abuse/neglect since last visito No Patient Requires Transmission-Based Precautions: No Hospitalized since last visit: No Patient Has Alerts: No Implantable device outside of the clinic excluding No cellular tissue based products placed in the center since last visit: Has Dressing in Place as Prescribed: Yes Pain Present Now: No Electronic Signature(s) Signed: 09/20/2021 9:01:13 AM By: Carlene Coria RN Entered By: Carlene Coria on 09/18/2021 13:01:40 Nucci, Aysen L. ([id number]) -------------------------------------------------------------------------------- Clinic Level of Care Assessment Details Patient Name: Mark Morale L. Date of Service: 09/18/2021 12:30 PM Medical Record Number: [id number] Patient Account Number: [id number] Date of Birth/Sex: 1940-11-13 (81 y.o. M) Treating RN: Carlene Coria Primary Care Yanci Bachtell: Jenna Luo Other Clinician: Referring Shaunda Tipping: Jenna Luo Treating Monica Codd/Extender: Skipper Cliche in Treatment: 16 Clinic Level of Care  Assessment Items TOOL 4 Quantity Score X - Use when only an EandM is performed on FOLLOW-UP visit 1 0 ASSESSMENTS - Nursing Assessment / Reassessment X - Reassessment of Co-morbidities (includes updates in patient status) 1 10 X- 1 5 Reassessment of Adherence to Treatment Plan ASSESSMENTS - Wound and Skin Assessment / Reassessment []  - Simple Wound Assessment / Reassessment - one wound 0 X- 2 5 Complex Wound Assessment / Reassessment - multiple wounds []  - 0 Dermatologic / Skin Assessment (not related to wound area) ASSESSMENTS - Focused Assessment []  - Circumferential Edema Measurements - multi extremities 0 []  - 0 Nutritional Assessment / Counseling / Intervention []  - 0 Lower Extremity Assessment (monofilament, tuning fork, pulses) []  - 0 Peripheral Arterial Disease Assessment (using hand held doppler) ASSESSMENTS - Ostomy and/or Continence Assessment and Care []  - Incontinence Assessment and Management 0 []  - 0 Ostomy Care Assessment and Management (repouching, etc.) PROCESS - Coordination of Care X - Simple Patient / Family Education for ongoing care 1 15 []  - 0 Complex (extensive) Patient / Family Education for ongoing care []  - 0 Staff obtains Programmer, systems, Records, Test Results / Process Orders []  - 0 Staff telephones HHA, Nursing Homes / Clarify orders / etc []  - 0 Routine Transfer to another Facility (non-emergent condition) []  - 0 Routine Hospital Admission (non-emergent condition) []  - 0 New Admissions / Biomedical engineer / Ordering NPWT, Apligraf, etc. []  - 0 Emergency Hospital Admission (emergent condition) X- 1 10 Simple Discharge Coordination []  - 0 Complex (extensive) Discharge Coordination PROCESS - Special Needs []  - Pediatric / Minor Patient Management 0 []  - 0 Isolation Patient Management []  - 0 Hearing / Language / Visual special needs []  - 0 Assessment of Community assistance (transportation, D/C planning, etc.) []  - 0 Additional  assistance / Altered mentation []  - 0 Support Surface(s) Assessment (bed, cushion, seat, etc.) INTERVENTIONS - Wound Cleansing / Measurement Washburn, Dorrance L. ([id number]) []  - 0 Simple Wound Cleansing - one  wound X- 2 5 Complex Wound Cleansing - multiple wounds []  - 0 Wound Imaging (photographs - any number of wounds) []  - 0 Wound Tracing (instead of photographs) []  - 0 Simple Wound Measurement - one wound X- 2 5 Complex Wound Measurement - multiple wounds INTERVENTIONS - Wound Dressings X - Small Wound Dressing one or multiple wounds 1 10 X- 1 15 Medium Wound Dressing one or multiple wounds []  - 0 Large Wound Dressing one or multiple wounds []  - 0 Application of Medications - topical []  - 0 Application of Medications - injection INTERVENTIONS - Miscellaneous []  - External ear exam 0 []  - 0 Specimen Collection (cultures, biopsies, blood, body fluids, etc.) []  - 0 Specimen(s) / Culture(s) sent or taken to Lab for analysis []  - 0 Patient Transfer (multiple staff / Civil Service fast streamer / Similar devices) []  - 0 Simple Staple / Suture removal (25 or less) []  - 0 Complex Staple / Suture removal (26 or more) []  - 0 Hypo / Hyperglycemic Management (close monitor of Blood Glucose) []  - 0 Ankle / Brachial Index (ABI) - do not check if billed separately []  - 0 Vital Signs Has the patient been seen at the hospital within the last three years: Yes Total Score: 95 Level Of Care: New/Established - Level 3 Electronic Signature(s) Signed: 09/20/2021 9:01:13 AM By: Carlene Coria RN Entered By: Carlene Coria on 09/18/2021 13:04:01 Hershberger, Gregery L. ([id number]) -------------------------------------------------------------------------------- Encounter Discharge Information Details Patient Name: Mark Morale L. Date of Service: 09/18/2021 12:30 PM Medical Record Number: [id number] Patient Account Number: [id number] Date of Birth/Sex: 16-Jun-1941 (81 y.o. M) Treating RN: Carlene Coria Primary Care Mirna Sutcliffe: Jenna Luo Other Clinician: Referring Carly Sabo: Jenna Luo Treating Esmee Fallaw/Extender: Skipper Cliche in Treatment: 16 Encounter Discharge Information Items Discharge Condition: Stable Ambulatory Status: Walker Discharge Destination: Home Transportation: Private Auto Accompanied By: wife Schedule Follow-up Appointment: Yes Clinical Summary of Care: Patient Declined Electronic Signature(s) Signed: 09/20/2021 9:01:13 AM By: Carlene Coria RN Entered By: Carlene Coria on 09/18/2021 13:03:23 Bott, Kamron L. ([id number]) -------------------------------------------------------------------------------- Wound Assessment Details Patient Name: [name], [name] Date of Service: 09/18/2021 12:30 PM Medical Record Number: [id number] Patient Account Number: [id number] Date of Birth/Sex: [birth year] (81 y.o. M) Treating RN: Carlene Coria Primary Care Marceline Napierala: Jenna Luo Other Clinician: Referring Maxi Rodas: Jenna Luo Treating Marien Manship/Extender: Skipper Cliche in Treatment: 16 Wound Status Wound Number: 1 Primary Etiology: Calciphylaxis Wound Location: Left, Posterior Lower Leg Wound Status: Open Wounding Event: Gradually Appeared Comorbid History: Coronary Artery Disease, Type II Diabetes Date Acquired: 04/05/2021 Weeks Of Treatment: 16 Clustered Wound: Yes Wound Measurements Length: (cm) 20 Width: (cm) 14 Depth: (cm) 0.2 Area: (cm) 219.911 Volume: (cm) 43.982 % Reduction in Area: -27.3% % Reduction in Volume: 36.4% Epithelialization: None Tunneling: No Undermining: No Wound Description Classification: Full Thickness Without Exposed Support Structures Exudate Amount: Medium Exudate Type: Serosanguineous Exudate Color: red, brown Foul Odor After Cleansing: No Slough/Fibrino Yes Wound Bed Granulation Amount: Medium (34-66%) Exposed Structure Granulation Quality: Red, Pink Fascia Exposed: No Necrotic Amount: Small  (1-33%) Fat Layer (Subcutaneous Tissue) Exposed: Yes Necrotic Quality: Eschar, Adherent Slough Tendon Exposed: No Muscle Exposed: No Joint Exposed: No Bone Exposed: No Treatment Notes Wound #1 (Lower Leg) Wound Laterality: Left, Posterior Cleanser Byram Ancillary Kit - 15 Day Supply Discharge Instruction: Use supplies as instructed; Kit contains: (15) Saline Bullets; (15) 3x3 Gauze; 15 pr Gloves Peri-Wound Care Topical Primary Dressing Kerlix AMD Roll Dressing, 4.5x 4.1 (in/in) Discharge Instruction: moisten with dakins Secondary Dressing Kerlix 4.5 x 4.1 (  in/yd) Discharge Instruction: Apply Kerlix 4.5 x 4.1 (in/yd) as instructed Secured With 42M [place] Surgical Tape, 2x2 (in/yd) Compression Wrap Compression Stockings GARTH, DIFFLEY ([id number]) Add-Ons Electronic Signature(s) Signed: 09/20/2021 9:01:13 AM By: Carlene Coria RN Entered By: Carlene Coria on 09/18/2021 13:02:07 Albarracin, Eric L. ([id number]) -------------------------------------------------------------------------------- Wound Assessment Details Patient Name: Murad, Ukiah L. Date of Service: 09/18/2021 12:30 PM Medical Record Number: [id number] Patient Account Number: [id number] Date of Birth/Sex: 08-03-1941 (81 y.o. M) Treating RN: Carlene Coria Primary Care Claus Silvestro: Jenna Luo Other Clinician: Referring Lakia Gritton: Jenna Luo Treating Ave Scharnhorst/Extender: Skipper Cliche in Treatment: 16 Wound Status Wound Number: 2 Primary Etiology: Calciphylaxis Wound Location: Penis Wound Status: Open Wounding Event: Gradually Appeared Comorbid History: Coronary Artery Disease, Type II Diabetes Date Acquired: 05/05/2021 Weeks Of Treatment: 14 Clustered Wound: No Wound Measurements Length: (cm) 0.3 Width: (cm) 0.3 Depth: (cm) 0.1 Area: (cm) 0.071 Volume: (cm) 0.007 % Reduction in Area: 98.5% % Reduction in Volume: 98.5% Epithelialization: None Tunneling: No Undermining: No Wound  Description Classification: Full Thickness Without Exposed Support Structures Exudate Amount: Medium Exudate Type: Serosanguineous Exudate Color: red, brown Foul Odor After Cleansing: No Slough/Fibrino Yes Wound Bed Granulation Amount: Medium (34-66%) Exposed Structure Necrotic Amount: Medium (34-66%) Fascia Exposed: No Necrotic Quality: Eschar, Adherent Slough Fat Layer (Subcutaneous Tissue) Exposed: Yes Tendon Exposed: No Muscle Exposed: No Joint Exposed: No Bone Exposed: No Treatment Notes Wound #2 (Penis) Cleanser Byram Ancillary Kit - 15 Day Supply Discharge Instruction: Use supplies as instructed; Kit contains: (15) Saline Bullets; (15) 3x3 Gauze; 15 pr Gloves Peri-Wound Care Topical Primary Dressing Gauze Discharge Instruction: moistened with santyl Santyl Collagenase Ointment, 30 (gm), tube Secondary Dressing Secured With Compression Wrap Compression Stockings Add-Ons ALPHONSUS, DOYEL ([id number]) Electronic Signature(s) Signed: 09/20/2021 9:01:13 AM By: Carlene Coria RN Entered By: Carlene Coria on 09/18/2021 13:02:19

## 2021-09-20 NOTE — Progress Notes (Signed)
KYLYN, SOOKRAM ([id number]) Visit Report for 09/18/2021 Physician Orders Details Patient Name: [name], [name]. Date of Service: 09/18/2021 12:30 PM Medical Record Number: [id number] Patient Account Number: [id number] Date of Birth/Sex: [birth year] (81 y.o. M) Treating RN: Carlene Coria Primary Care Provider: Jenna Luo Other Clinician: Referring Provider: Jenna Luo Treating Provider/Extender: Skipper Cliche in Treatment: [phone] Verbal / Phone Orders: No Diagnosis Coding Follow-up Appointments o Return Appointment in 1 week. Edema Control - Lymphedema / Segmental Compressive Device / Other o Elevate, Exercise Daily and Avoid Standing for Long Periods of Time. o Elevate legs to the level of the heart and pump ankles as often as possible o Elevate leg(s) parallel to the floor when sitting. Wound Treatment Wound #1 - Lower Leg Wound Laterality: Left, Posterior Cleanser: Byram Ancillary Kit - 15 Day Supply (DME) (Generic) 1 x Per Day/30 Days Discharge Instructions: Use supplies as instructed; Kit contains: (15) Saline Bullets; (15) 3x3 Gauze; 15 pr Gloves Primary Dressing: Kerlix Roll Sterile, 4.5x4.1 (in/yd) 1 x Per Day/30 Days Secondary Dressing: Kerlix 4.5 x 4.1 (in/yd) (Generic) 1 x Per Day/30 Days Discharge Instructions: Apply Kerlix 4.5 x 4.1 (in/yd) as instructed Secured With: 7M Medipore H Soft Cloth Surgical Tape, 2x2 (in/yd) (Generic) 1 x Per Day/30 Days Wound #2 - Penis Cleanser: Byram Ancillary Kit - 15 Day Supply (DME) (Generic) 1 x Per Day/30 Days Discharge Instructions: Use supplies as instructed; Kit contains: (15) Saline Bullets; (15) 3x3 Gauze; 15 pr Gloves Primary Dressing: Gauze (DME) (Generic) 1 x Per Day/30 Days Discharge Instructions: moistened with santyl Primary Dressing: Santyl Collagenase Ointment, 30 (gm), tube 1 x Per Day/30 Days Electronic Signature(s) Signed: 09/18/2021 2:51:33 PM By: Worthy Keeler PA-C Signed: 09/20/2021 9:01:13 AM By:  Carlene Coria RN Entered By: Carlene Coria on 09/18/2021 14:27:46 Emanuele, Marck L. ([id number]) -------------------------------------------------------------------------------- SuperBill Details Patient Name: [name], Leeandre L. Date of Service: 09/18/2021 Medical Record Number: [id number] Patient Account Number: [id number] Date of Birth/Sex: 1940/12/20 (81 y.o. M) Treating RN: Carlene Coria Primary Care Provider: Jenna Luo Other Clinician: Referring Provider: Jenna Luo Treating Provider/Extender: Skipper Cliche in Treatment: 16 Diagnosis Coding ICD-10 Codes Code Description E83.50 Unspecified disorder of calcium metabolism L97.822 Non-pressure chronic ulcer of other part of left lower leg with fat layer exposed L98.492 Non-pressure chronic ulcer of skin of other sites with fat layer exposed Z99.2 Dependence on renal dialysis N18.4 Chronic kidney disease, stage 4 (severe) I25.10 Atherosclerotic heart disease of native coronary artery without angina pectoris Facility Procedures CPT4 Code: [id number] Description: 99213 - WOUND CARE VISIT-LEV 3 EST PT Modifier: Quantity: 1 Electronic Signature(s) Signed: 09/18/2021 2:51:33 PM By: Worthy Keeler PA-C Signed: 09/20/2021 9:01:13 AM By: Carlene Coria RN Entered By: Carlene Coria on 09/18/2021 13:04:08

## 2021-09-24 DIAGNOSIS — D689 Coagulation defect, unspecified: Secondary | ICD-10-CM | POA: Diagnosis not present

## 2021-09-24 DIAGNOSIS — E876 Hypokalemia: Secondary | ICD-10-CM | POA: Diagnosis not present

## 2021-09-24 DIAGNOSIS — Z992 Dependence on renal dialysis: Secondary | ICD-10-CM | POA: Diagnosis not present

## 2021-09-24 DIAGNOSIS — N2581 Secondary hyperparathyroidism of renal origin: Secondary | ICD-10-CM | POA: Diagnosis not present

## 2021-09-24 DIAGNOSIS — N186 End stage renal disease: Secondary | ICD-10-CM | POA: Diagnosis not present

## 2021-09-24 DIAGNOSIS — N454 Abscess of epididymis or testis: Secondary | ICD-10-CM | POA: Diagnosis not present

## 2021-09-25 ENCOUNTER — Other Ambulatory Visit: Payer: Self-pay

## 2021-09-25 ENCOUNTER — Encounter: Payer: PPO | Admitting: Physician Assistant

## 2021-09-25 DIAGNOSIS — L97222 Non-pressure chronic ulcer of left calf with fat layer exposed: Secondary | ICD-10-CM | POA: Diagnosis not present

## 2021-09-25 DIAGNOSIS — L98492 Non-pressure chronic ulcer of skin of other sites with fat layer exposed: Secondary | ICD-10-CM | POA: Diagnosis not present

## 2021-09-25 NOTE — Progress Notes (Addendum)
UNO, ESAU ([id number]) Visit Report for 09/25/2021 Chief Complaint Document Details Patient Name: [name], [name]. Date of Service: 09/25/2021 12:30 PM Medical Record Number: [id number] Patient Account Number: [id number] Date of Birth/Sex: 1941-06-06 (81 y.o. M) Treating RN: Carlene Coria Primary Care Provider: Jenna Luo Other Clinician: Referring Provider: Jenna Luo Treating Provider/Extender: Skipper Cliche in Treatment: 17 Information Obtained from: Patient Chief Complaint Left LE Calciphylaxis and Glans Penis Calciphylaxis Electronic Signature(s) Signed: 09/25/2021 1:08:30 PM By: Worthy Keeler PA-C Entered By: Worthy Keeler on 09/25/2021 13:08:30 Mccraw, Maguire L. ([id number]) -------------------------------------------------------------------------------- HPI Details Patient Name: Mark Morale L. Date of Service: 09/25/2021 12:30 PM Medical Record Number: [id number] Patient Account Number: [id number] Date of Birth/Sex: 1940-09-05 (81 y.o. 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 This has been present for a couple of months currently he is on the sodium thiosulfate at dialysis. He tells me that they picked up on this quickly and have been managing it but he really has not had any help with anything else from the standpoint of progressing the wound itself. The patient does have again end-stage renal disease which currently is listed as stage IV but he is on renal dialysis. He also has coronary artery disease as well as going shortly for formal arterial studies although he did have a  quick test of the tibial artery with his vascular doctor when he went in to check on his fistula for his arm and subsequently they did not feel like he had any hemodynamically unstable flow into the lower extremity this is good news. Nonetheless I think that the biggest issue here is going to be getting some of the eschar off so that we get the wounds med headed in the appropriate direction. 05/31/2021 upon evaluation today patient appears to be doing well with regard to his leg ulceration with calciphylaxis. I am very pleased in this regard. Fortunately there does not appear to be any signs of active infection at this time which is great news. No fevers, chills, nausea, vomiting, or diarrhea. 06/07/2021 upon evaluation today patient appears to be doing well with regard to his legs. Unfortunately he is having an issue here with his penis as well he did mention at the end of last visit. I suggested that he go see urology. However when he went to see the urologist they basically told him there was not anything they could do. They recommended that we needed to be the ones to take care of this. With that being said this is an unusual area that to be honest a lot of the traditional wound care products we use are not really good to be good for this region. Potentially Medihoney alginate could be a possibility also think Santyl could be a possibility. With that being said I think initially my suggestion is probably can be for [place] to attempt the Santyl to see if that will benefit the patient. 06/21/2021 upon evaluation today patient's wound bed actually showed signs of good granulation and epithelization at this point. Fortunately I do not see any signs of active infection systemically which is great news and his legs are doing awesome I think you are making great progress. The Santyl on  the glans penis also has been of benefit this is looking tremendously better. Again the patient is pleased he also tells me  the pain is significantly improved. Overall I think we are headed in the appropriate direction here. 07/05/2021 upon evaluation today patient appears to be doing well with regard to his wounds. Both the area on the tip of his penis as well as the left lateral leg is doing well. There is some debridement this can be needed over the leg region. Fortunately I think that overall the Dakin's moistened gauze has done a great job here. No fevers, chills, nausea, vomiting, or diarrhea. 07/12/2021 upon evaluation today patient presents for reevaluation here in the clinic and overall I think that his wounds are doing significantly better which is great news. This includes both the left leg as well as the glans of the penis. Both are showing signs of significant improvement which is great and overall I think that we are headed in the right direction. I do not see any evidence of active infection at this moment. 07/19/2021 upon evaluation today patient appears to be doing better in regard to his wounds. Were getting much closer to complete resolution in regard to the necrotic tissue being removed from the leg. Overall I am extremely pleased with where we stand today. Overall I do not see any signs of active infection at this time. With regard to the patient's penis this area is showing signs of improvement as well and I am very pleased with that. 07/26/2021 upon evaluation today patient appears to be doing well with regard to his wounds. The leg in fact is looking quite well and I am very pleased with where we stand today. I do not see any signs of infection which is great and there is a lot of new granulation growth also also. With regard to the penis area this also showed signs of excellent improvement which is great news. 08/14/2020 upon evaluation today patient appears to be doing well with regard to his wounds both the leg as well as the glans penis region. Both are showing signs of improvement which is great  news and overall I am extremely pleased with where we stand at this point. I think that the Dakin's moistened gauze dressing for her leg is doing great and the Santyl is definitely helping with the glans penis location. 08/28/2021 upon evaluation today patient appears to be doing well with regard to his wounds on the left leg as well as the penis. Fortunately both are showing signs of improvement. I am going to perform some debridement in regard to the leg there is a small area which is having trouble getting completely clean my work on that a little bit more today. 09/04/2021 upon evaluation today patient appears to be doing pretty well in regard to his wounds. 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 Again he has I just found out today been off of the sodium thiosulfate for about 2 to 3 weeks. He tells me that the initial "3 weeks" was stated to be up by the nurse. With that being said I think he probably is going to need it for a bit longer to be honest. We discussed that today. Over the penis area the Santyl does seem to be loose and a lot of this up and that is good hopefully will be able  to get this completely cleared off it sometime shortly. 09/13/2021 upon evaluation today patient appears to be doing well with regard to his wound. He has been tolerating the dressing changes without complication. Fortunately there does not appear to be any signs of active infection locally or systemically which is great news. Overall I think that he is making progress with regard to his wounds in general. 09/25/2021 upon evaluation today patient appears to be doing excellent in regard to his wounds. I am actually extremely pleased with where we stand today and I think he is making great progress. The patient likewise is happy to hear this. [name], [name] ([id number]) Electronic Signature(s) Signed: 09/25/2021  4:32:25 PM By: Worthy Keeler PA-C Entered By: Worthy Keeler on 09/25/2021 16:32:25 Chai, [name] ([id number]) -------------------------------------------------------------------------------- Physical Exam Details Patient Name: [name], [name] Date of Service: 09/25/2021 12:30 PM Medical Record Number: [id number] Patient Account Number: [id number] Date of Birth/Sex: 02/08/1941 (81 y.o. M) Treating RN: Carlene Coria Primary Care Provider: Jenna Luo Other Clinician: Referring Provider: Jenna Luo Treating Provider/Extender: Skipper Cliche in Treatment: 36 Constitutional Well-nourished and well-hydrated in no acute distress. Respiratory normal breathing without difficulty. Psychiatric this patient is able to make decisions and demonstrates good insight into disease process. Alert and Oriented x 3. pleasant and cooperative. Notes Upon inspection patient's wound bed actually showed signs of good granulation and epithelization at this point. Fortunately I do not see any evidence of active infection locally nor systemically which is great news and overall I think that the Dakin's moistened gauze dressing to the leg is actually doing quite well. This is filling in nicely we may switch to [facility] some time but right now it is doing so well I really hate to make changes to be perfectly honest. Electronic Signature(s) Signed: 09/25/2021 4:33:04 PM By: Worthy Keeler PA-C Entered By: Worthy Keeler on 09/25/2021 16:33:04 Riano, [name] ([id number]) -------------------------------------------------------------------------------- Physician Orders Details Patient Name: Mark Morale L. Date of Service: 09/25/2021 12:30 PM Medical Record Number: [id number] Patient Account Number: [id number] Date of Birth/Sex: 14-Jan-1941 (81 y.o. 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 Edema Control - Lymphedema / Segmental Compressive Device / Other o Elevate, Exercise Daily and Avoid Standing for Long Periods of Time. o Elevate legs to the level of the heart and pump ankles as often as possible o Elevate leg(s) parallel to the floor when sitting. Wound Treatment Wound #1 - Lower Leg Wound Laterality: Left, Posterior Cleanser: Byram Ancillary Kit - 15 Day Supply (Generic) 1 x Per Day/30 Days Discharge Instructions: Use supplies as instructed; Kit contains: (15) Saline Bullets; (15) 3x3 Gauze; 15 pr Gloves Primary Dressing: Kerlix Roll Sterile, 4.5x4.1 (in/yd) 1 x Per Day/30 Days Secondary Dressing: Kerlix 4.5 x 4.1 (in/yd) (Generic) 1 x Per Day/30 Days Discharge Instructions: Apply Kerlix 4.5 x 4.1 (in/yd) as instructed Secured With: 72M Medipore H Soft Cloth Surgical Tape, 2x2 (in/yd) (Generic) 1 x Per  Day/30 Days Wound #2 - Penis Cleanser: Byram Ancillary Kit - 15 Day Supply (Generic) 1 x Per Day/30 Days Discharge Instructions: Use supplies as instructed; Kit contains: (15) Saline Bullets; (15) 3x3 Gauze; 15 pr Gloves Primary Dressing: Gauze (Generic) 1 x Per Day/30 Days Discharge Instructions: moistened with santyl Primary Dressing: Santyl Collagenase Ointment, 30 (gm), tube 1 x Per Day/30 Days Electronic Signature(s) Signed: 09/25/2021 4:50:19 PM By: Worthy Keeler PA-C Signed: 09/26/2021  3:39:51 PM By: Carlene Coria RN Entered By: Carlene Coria on 09/25/2021 13:11:40 [name], [name] ([id number]) -------------------------------------------------------------------------------- Problem List Details Patient Name: [name], [name] Date of Service: 09/25/2021 12:30 PM Medical Record Number: [id number] Patient Account Number: [id number] Date of Birth/Sex: 1940/12/15 (81 y.o. M) Treating RN: Carlene Coria Primary Care Provider: Jenna Luo Other Clinician: Referring Provider: Jenna Luo Treating Provider/Extender: Skipper Cliche in Treatment: 17 Active Problems ICD-10 Encounter Code Description Active Date MDM Diagnosis E83.50 Unspecified disorder of calcium metabolism 05/24/2021 No Yes L97.822 Non-pressure chronic ulcer of other part of left lower leg with fat layer 05/24/2021 No Yes exposed L98.492 Non-pressure chronic ulcer of skin of other sites with fat layer exposed 06/07/2021 No Yes Z99.2 Dependence on renal dialysis 05/24/2021 No Yes N18.4 Chronic kidney disease, stage 4 (severe) 05/24/2021 No Yes I25.10 Atherosclerotic heart disease of native coronary artery without angina 05/24/2021 No Yes pectoris Inactive Problems Resolved Problems Electronic Signature(s) Signed: 09/25/2021 1:08:26 PM By: Worthy Keeler PA-C Entered By: Worthy Keeler on 09/25/2021 13:08:25 [name], [name] ([id number]) -------------------------------------------------------------------------------- Progress Note Details Patient Name: [name], [name] Date of Service: 09/25/2021 12:30 PM Medical Record Number: [id number] Patient Account Number: [id number] Date of Birth/Sex: Mar 23, 1941 (81 y.o. 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 This has been present for a couple of months currently he is on the sodium thiosulfate at dialysis. He tells me that they picked up on this quickly and have been managing it but he really has not had any help with anything else from the standpoint of progressing the wound itself. The patient does have again end-stage renal disease which currently is listed as stage IV but he is on renal dialysis. He also has coronary artery disease as well as going shortly for formal arterial studies although he did have a quick test of the tibial artery with his vascular doctor when he went in to check on his fistula for his arm and subsequently they did not feel like he had any hemodynamically unstable flow into the lower extremity this is good news. Nonetheless I think that the biggest issue here is going to be getting some of the eschar off so that we get the wounds med headed in the appropriate direction. 05/31/2021 upon evaluation today patient appears to be doing well with regard to his leg ulceration with calciphylaxis. I am very pleased in this regard. Fortunately there does not appear to be any signs of active infection at this time which is great news. No fevers, chills, nausea, vomiting, or diarrhea. 06/07/2021 upon evaluation today patient appears to be doing well with regard to his legs. Unfortunately he is having an issue here  with his penis as well he did mention at the end of last visit. I suggested that he go see urology. However when he went to see the urologist they basically told him there was not anything they could do. They recommended that we needed to be the ones to take care of this. With that being said this is an unusual area that to be honest a lot of the  traditional wound care products we use are not really good to be good for this region. Potentially Medihoney alginate could be a possibility also think Santyl could be a possibility. With that being said I think initially my suggestion is probably can be for [place] to attempt the Santyl to see if that will benefit the patient. 06/21/2021 upon evaluation today patient's wound bed actually showed signs of good granulation and epithelization at this point. Fortunately I do not see any signs of active infection systemically which is great news and his legs are doing awesome I think you are making great progress. The Santyl on the glans penis also has been of benefit this is looking tremendously better. Again the patient is pleased he also tells me the pain is significantly improved. Overall I think we are headed in the appropriate direction here. 07/05/2021 upon evaluation today patient appears to be doing well with regard to his wounds. Both the area on the tip of his penis as well as the left lateral leg is doing well. There is some debridement this can be needed over the leg region. Fortunately I think that overall the Dakin's moistened gauze has done a great job here. No fevers, chills, nausea, vomiting, or diarrhea. 07/12/2021 upon evaluation today patient presents for reevaluation here in the clinic and overall I think that his wounds are doing significantly better which is great news. This includes both the left leg as well as the glans of the penis. Both are showing signs of significant improvement which is great and overall I think that we are headed in the right direction. I do not see any evidence of active infection at this moment. 07/19/2021 upon evaluation today patient appears to be doing better in regard to his wounds. Were getting much closer to complete resolution in regard to the necrotic tissue being removed from the leg. Overall I am extremely pleased with where we stand today. Overall I do  not see any signs of active infection at this time. With regard to the patient's penis this area is showing signs of improvement as well and I am very pleased with that. 07/26/2021 upon evaluation today patient appears to be doing well with regard to his wounds. The leg in fact is looking quite well and I am very pleased with where we stand today. I do not see any signs of infection which is great and there is a lot of new granulation growth also also. With regard to the penis area this also showed signs of excellent improvement which is great news. 08/14/2020 upon evaluation today patient appears to be doing well with regard to his wounds both the leg as well as the glans penis region. Both are showing signs of improvement which is great news and overall I am extremely pleased with where we stand at this point. I think that the Dakin's moistened gauze dressing for her leg is doing great and the Santyl is definitely helping with the glans penis location. 08/28/2021 upon evaluation today patient appears to be doing well with regard to his wounds on the  left leg as well as the penis. Fortunately both are showing signs of improvement. I am going to perform some debridement in regard to the leg there is a small area which is having trouble getting completely clean my work on that a little bit more today. 09/04/2021 upon evaluation today patient appears to be doing pretty well in regard to his wounds. 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 Again he has I just found out today been off of the sodium thiosulfate for about 2 to 3 weeks. He tells me that the initial "3 weeks" was stated to be up by the nurse. With that being said I think he probably is going to need it for a bit longer to be honest. We discussed that today. Over the penis area the Santyl does seem to be loose and a lot of this up and that is  good hopefully will be able to get this completely cleared off it sometime shortly. 09/13/2021 upon evaluation today patient appears to be doing well with regard to his wound. He has been tolerating the dressing changes without complication. Fortunately there does not appear to be any signs of active infection locally or systemically which is great news. Overall I think that he is making progress with regard to his wounds in general. [name], [name] ([id number]) 09/25/2021 upon evaluation today patient appears to be doing excellent in regard to his wounds. I am actually extremely pleased with where we stand today and I think he is making great progress. The patient likewise is happy to hear this. Objective Constitutional Well-nourished and well-hydrated in no acute distress. Vitals Time Taken: 12:41 PM, Height: 68 in, Weight: 172 lbs, BMI: 26.1, Temperature: 98.2 F, Pulse: 96 bpm, Respiratory Rate: 18 breaths/min, Blood Pressure: 156/81 mmHg. Respiratory normal breathing without difficulty. Psychiatric this patient is able to make decisions and demonstrates good insight into disease process. Alert and Oriented x 3. pleasant and cooperative. General Notes: Upon inspection patient's wound bed actually showed signs of good granulation and epithelization at this point. Fortunately I do not see any evidence of active infection locally nor systemically which is great news and overall I think that the Dakin's moistened gauze dressing to the leg is actually doing quite well. This is filling in nicely we may switch to [facility] some time but right now it is doing so well I really hate to make changes to be perfectly honest. Integumentary (Hair, Skin) Wound #1 status is Open. Original cause of wound was Gradually Appeared. The date acquired was: 04/05/2021. The wound has been in treatment 17 weeks. The wound is located on the Left,Posterior Lower Leg. The wound measures 20cm length x 14cm width x  0.2cm depth; 219.911cm^2 area and 43.982cm^3 volume. There is Fat Layer (Subcutaneous Tissue) exposed. There is no tunneling or undermining noted. There is a medium amount of serosanguineous drainage noted. There is medium (34-66%) red, pink granulation within the wound bed. There is a small (1-33%) amount of necrotic tissue within the wound bed including Eschar and Adherent Slough. Wound #2 status is Open. Original cause of wound was Gradually Appeared. The date acquired was: 05/05/2021. The wound has been in treatment 15 weeks. The wound is located on the Penis. The wound measures 0.5cm length x 0.5cm width x 0.1cm depth; 0.196cm^2 area and 0.02cm^3 volume. There is Fat Layer (Subcutaneous Tissue) exposed. There  is no tunneling or undermining noted. There is a medium amount of serosanguineous drainage noted. There is medium (34-66%) granulation within the wound bed. There is a medium (34-66%) amount of necrotic tissue within the wound bed including Eschar and Adherent Slough. 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 Edema Control - Lymphedema / Segmental Compressive Device / Other: Elevate, Exercise Daily and Avoid Standing for Long Periods of Time. Elevate legs to the level of the heart and pump ankles as often as possible Elevate leg(s) parallel to the floor when sitting. WOUND #1: - Lower Leg Wound Laterality: Left, Posterior Cleanser: Byram Ancillary Kit - 15 Day Supply (Generic) 1 x Per Day/30 Days Weekly, [name] ([id number]) Discharge Instructions: Use supplies as instructed; Kit  contains: (15) Saline Bullets; (15) 3x3 Gauze; 15 pr Gloves Primary Dressing: Kerlix Roll Sterile, 4.5x4.1 (in/yd) 1 x Per Day/30 Days Secondary Dressing: Kerlix 4.5 x 4.1 (in/yd) (Generic) 1 x Per Day/30 Days Discharge Instructions: Apply Kerlix 4.5 x 4.1 (in/yd) as instructed Secured With: 69M Medipore H Soft Cloth Surgical Tape, 2x2 (in/yd) (Generic) 1 x Per Day/30 Days WOUND #2: - Penis Wound Laterality: Cleanser: Byram Ancillary Kit - 15 Day Supply (Generic) 1 x Per Day/30 Days Discharge Instructions: Use supplies as instructed; Kit contains: (15) Saline Bullets; (15) 3x3 Gauze; 15 pr Gloves Primary Dressing: Gauze (Generic) 1 x Per Day/30 Days Discharge Instructions: moistened with santyl Primary Dressing: Santyl Collagenase Ointment, 30 (gm), tube 1 x Per Day/30 Days 1. Would recommend currently that we going to continue with the wound care measures as before and the patient is in agreement with the plan. This includes the use of the Santyl to the glans penis. 2. I am also can recommend we continue with the Dakin's moistened gauze to the leg. 3. I am also going to suggest the patient should continue to monitor for any signs of worsening or infection if anything changes he should let me know soon as possible. We will see patient back for reevaluation in 1 week here in the clinic. If anything worsens or changes patient will contact our office for additional recommendations. Electronic Signature(s) Signed: 09/25/2021 4:33:29 PM By: Worthy Keeler PA-C Entered By: Worthy Keeler on 09/25/2021 16:33:28 [name], [name] ([id number]) -------------------------------------------------------------------------------- SuperBill Details Patient Name: Mark Morale L. Date of Service: 09/25/2021 Medical Record Number: [id number] Patient Account Number: [id number] Date of Birth/Sex: 1941/03/02 (81 y.o. 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 with fat layer exposed Z99.2 Dependence on renal dialysis N18.4 Chronic kidney disease, stage 4 (severe) I25.10 Atherosclerotic heart disease of native coronary artery without angina pectoris Facility Procedures CPT4 Code: 99242683 Description: 99213 - WOUND CARE VISIT-LEV 3 EST PT Modifier: Quantity: 1 Physician Procedures CPT4 Code: 4196222 Description: 99214 - WC PHYS LEVEL 4 - EST PT Modifier: Quantity: 1 CPT4 Code: Description: ICD-10 Diagnosis Description E83.50 Unspecified  disorder of calcium metabolism L97.822 Non-pressure chronic ulcer of other part of left lower leg with fat lay L98.492 Non-pressure chronic ulcer of skin of other sites with fat layer expose  Z99.2 Dependence on renal dialysis Modifier: er exposed d Quantity: Electronic Signature(s) Signed: 09/25/2021 4:34:00 PM By: Worthy Keeler PA-C Entered By: Worthy Keeler on 09/25/2021 16:34:00

## 2021-09-26 NOTE — Progress Notes (Addendum)
DREVON, PLOG ([id number]) Visit Report for 09/25/2021 Arrival Information Details Patient Name: [name], [name]. Date of Service: 09/25/2021 12:30 PM Medical Record Number: [id number] Patient Account Number: [id number] Date of Birth/Sex: 1941/06/22 (81 y.o. M) Treating RN: Carlene Coria Primary Care Hillarie Harrigan: Jenna Luo Other Clinician: Referring Anntoinette Haefele: Jenna Luo Treating Macrina Lehnert/Extender: Skipper Cliche in Treatment: 85 Visit Information History Since Last Visit All ordered tests and consults were completed: No Patient Arrived: [name] Added or deleted any medications: No Arrival Time: 12:36 Any new allergies or adverse reactions: No Accompanied By: wife Had a fall or experienced change in No Transfer Assistance: None activities of daily living that may affect Patient Identification Verified: Yes risk of falls: Secondary Verification Process Completed: Yes Signs or symptoms of abuse/neglect since last visito No Patient Requires Transmission-Based Precautions: No Hospitalized since last visit: No Patient Has Alerts: No Implantable device outside of the clinic excluding No cellular tissue based products placed in the center since last visit: Has Dressing in Place as Prescribed: Yes Pain Present Now: No Electronic Signature(s) Signed: 09/26/2021 3:39:51 PM By: Carlene Coria RN Entered By: Carlene Coria on 09/25/2021 12:41:17 [name], [name] ([id number]) -------------------------------------------------------------------------------- Clinic Level of Care Assessment Details Patient Name: Althouse, Tamika L. Date of Service: 09/25/2021 12:30 PM Medical Record Number: [id number] Patient Account Number: [id number] Date of Birth/Sex: [birth year] (82 y.o. M) Treating RN: Carlene Coria Primary Care Jacolby Risby: Jenna Luo Other Clinician: Referring Aizen Duval: Jenna Luo Treating Doloros Kwolek/Extender: Skipper Cliche in Treatment: 17 Clinic Level of Care  Assessment Items TOOL 4 Quantity Score X - Use when only an EandM is performed on FOLLOW-UP visit 1 0 ASSESSMENTS - Nursing Assessment / Reassessment X - Reassessment of Co-morbidities (includes updates in patient status) 1 10 X- 1 5 Reassessment of Adherence to Treatment Plan ASSESSMENTS - Wound and Skin Assessment / Reassessment _0  - Simple Wound Assessment / Reassessment - one wound 0 X- 1 5 Complex Wound Assessment / Reassessment - multiple wounds _1  - 0 Dermatologic / Skin Assessment (not related to wound area) ASSESSMENTS - Focused Assessment _2  - Circumferential Edema Measurements - multi extremities 0 _3  - 0 Nutritional Assessment / Counseling / Intervention _4  - 0 Lower Extremity Assessment (monofilament, tuning fork, pulses) _5  - 0 Peripheral Arterial Disease Assessment (using hand held doppler) ASSESSMENTS - Ostomy and/or Continence Assessment and Care _6  - Incontinence Assessment and Management 0 _7  - 0 Ostomy Care Assessment and Management (repouching, etc.) PROCESS - Coordination of Care X - Simple Patient / Family Education for ongoing care 1 15 _8  - 0 Complex (extensive) Patient / Family Education for ongoing care _9  - 0 Staff obtains Programmer, systems, Records, Test Results / Process Orders _10  - 0 Staff telephones HHA, Nursing Homes / Clarify orders / etc _11  - 0 Routine Transfer to another Facility (non-emergent condition) _12  - 0 Routine Hospital Admission (non-emergent condition) _13  - 0 New Admissions / Biomedical engineer / Ordering NPWT, Apligraf, etc. _14  - 0 Emergency Hospital Admission (emergent condition) X- 1 10 Simple Discharge Coordination _15  - 0 Complex (extensive) Discharge Coordination PROCESS - Special Needs _16  - Pediatric / Minor Patient Management 0 _17  - 0 Isolation Patient Management _18  - 0 Hearing / Language / Visual special needs _19  - 0 Assessment of Community assistance (transportation, D/C planning, etc.) _20  - 0 Additional  assistance / Altered mentation _21  - 0 Support Surface(s) Assessment (bed, cushion, seat, etc.) INTERVENTIONS - Wound Cleansing / Measurement [name], [name] ([id number]) _22  - 0 Simple Wound Cleansing - one  wound X- 2 5 Complex Wound Cleansing - multiple wounds X- 1 5 Wound Imaging (photographs - any number of wounds) _0  - 0 Wound Tracing (instead of photographs) _1  - 0 Simple Wound Measurement - one wound X- 2 5 Complex Wound Measurement - multiple wounds INTERVENTIONS - Wound Dressings X - Small Wound Dressing one or multiple wounds 1 10 X- 1 15 Medium Wound Dressing one or multiple wounds _2  - 0 Large Wound Dressing one or multiple wounds <ZOXWRUEAVWUJWJXB>_1<\/YNWGNFAOZHYQMVHQ>_4  - 0 Application of Medications - topical <ONGEXBMWUXLKGMWN>_0<\/UVOZDGUYQIHKVQQV>_9  - 0 Application of Medications - injection INTERVENTIONS - Miscellaneous _5  - External ear exam 0 _6  - 0 Specimen Collection (cultures, biopsies, blood, body fluids, etc.) _7  - 0 Specimen(s) / Culture(s) sent or taken to Lab for analysis _8  - 0 Patient Transfer (multiple staff / Civil Service fast streamer / Similar devices) _9  - 0 Simple Staple / Suture removal (25 or less) _10  - 0 Complex Staple / Suture removal (26 or more) _11  - 0 Hypo / Hyperglycemic Management (close monitor of Blood Glucose) _12  - 0 Ankle / Brachial Index (ABI) - do not check if billed separately X- 1 5 Vital Signs Has the patient been seen at the hospital within the last three years: Yes Total Score: 100 Level Of Care: New/Established - Level 3 Electronic Signature(s) Signed: 09/26/2021 3:39:51 PM By: Carlene Coria RN Entered By: Carlene Coria on 09/25/2021 13:12:46 [name], [name] ([id number]) -------------------------------------------------------------------------------- Complex / Palliative Patient Assessment Details Patient Name: [name], [name] Date of Service: 09/25/2021 12:30 PM Medical Record Number: [id number] Patient Account Number: [id number] Date of Birth/Sex: [birth year] (81 y.o. M) Treating RN: Cornell Barman Primary Care Jaileigh Weimer: Jenna Luo Other Clinician: Referring Cristo Ausburn: Jenna Luo Treating Lucy Woolever/Extender: Skipper Cliche in Treatment: 17 Complex Wound Management Criteria Patient has remarkable or complex co-morbidities requiring medications or treatments that extend wound healing times. Examples: o Diabetes mellitus with chronic renal failure or end stage renal disease requiring dialysis o Advanced or poorly controlled rheumatoid arthritis o Diabetes mellitus and end stage chronic obstructive pulmonary disease o Active cancer with current chemo- or radiation therapy Calcifliaxis Palliative Wound Management Criteria Care Approach Wound Care Plan: Complex Wound Management Electronic Signature(s) Signed: 10/02/2021 10:51:48 AM By: Gretta Cool, BSN, RN, CWS, Kim RN, BSN Signed: 10/02/2021 4:38:22 PM By: Worthy Keeler PA-C Entered By: Gretta Cool, BSN, RN, CWS, Kim on 10/02/2021 10:51:47 Hypolite, [name] ([id number]) -------------------------------------------------------------------------------- Encounter Discharge Information Details Patient Name: [name], [name] Date of Service: 09/25/2021 12:30 PM Medical Record Number: [id number] Patient Account Number: [id number] Date of Birth/Sex: 07-22-1941 (81 y.o. M) Treating RN: Carlene Coria Primary Care Shelisha Gautier: Jenna Luo Other Clinician: Referring Amalia Edgecombe: Jenna Luo Treating Nichoel Digiulio/Extender: Skipper Cliche in Treatment: 17 Encounter Discharge Information Items Discharge Condition: Stable Ambulatory Status: Walker Discharge Destination: Home Transportation: Private Auto Accompanied By: wife Schedule Follow-up Appointment: Yes Clinical Summary of Care: Patient Declined Electronic Signature(s) Signed: 09/26/2021 3:39:51 PM By: Carlene Coria RN Entered By: Carlene Coria on 09/25/2021 13:14:08 [name], [name]  ([id number]) -------------------------------------------------------------------------------- Lower Extremity Assessment Details Patient Name: Fidel, Deshan L. Date of Service: 09/25/2021 12:30 PM Medical Record Number: [id number] Patient Account Number: [id number] Date of Birth/Sex: [birth year] (81 y.o. M) Treating RN: Carlene Coria Primary Care Yeraldine Forney: Jenna Luo Other Clinician: Referring Anothy Bufano: Jenna Luo Treating Federica Allport/Extender: Skipper Cliche in Treatment: 17 Edema Assessment Assessed: [Left: No] [Right: No] Edema: [Left: Ye] [Right: s] Calf Left: Right: Point of Measurement: 34 cm From Medial Instep 32 cm Ankle Left: Right: Point of Measurement:  From Medial Instep 21 cm Vascular Assessment Pulses: Dorsalis Pedis Palpable: [Left:Yes] Electronic Signature(s) Signed: 09/26/2021 3:39:51 PM By: Carlene Coria RN Entered By: Carlene Coria on 09/25/2021 12:55:02 [name], [name] ([id number]) -------------------------------------------------------------------------------- Multi Wound Chart Details Patient Name: [name], [name] Date of Service: 09/25/2021 12:30 PM Medical Record Number: [id number] Patient Account Number: [id number] Date of Birth/Sex: 1940-08-23 (81 y.o. M) Treating RN: Carlene Coria Primary Care Merlen Gurry: Jenna Luo Other Clinician: Referring Jp Eastham: Jenna Luo Treating Tehya Leath/Extender: Skipper Cliche in Treatment: 17 Vital Signs Height(in): 68 Pulse(bpm): 96 Weight(lbs): 172 Blood Pressure(mmHg): 156/81 Body Mass Index(BMI): 26.1 Temperature(F): 98.2 Respiratory Rate(breaths/min): 18 Photos: [N/A:N/A] Wound Location: Left, Posterior Lower Leg Penis N/A Wounding Event: Gradually Appeared Gradually Appeared N/A Primary Etiology: Calciphylaxis Calciphylaxis N/A Comorbid History: Coronary Artery Disease, Type II Coronary Artery Disease, Type II N/A Diabetes Diabetes Date Acquired: 04/05/2021 05/05/2021 N/A Weeks of  Treatment: 17 15 N/A Wound Status: Open Open N/A Wound Recurrence: No No N/A Clustered Wound: Yes No N/A Measurements L x W x D (cm) 20x14x0.2 0.5x0.5x0.1 N/A Area (cm) : 219.911 0.196 N/A Volume (cm) : 43.982 0.02 N/A % Reduction in Area: -27.30% 95.80% N/A % Reduction in Volume: 36.40% 95.80% N/A Classification: Full Thickness Without Exposed Full Thickness Without Exposed N/A Support Structures Support Structures Exudate Amount: Medium Medium N/A Exudate Type: Serosanguineous Serosanguineous N/A Exudate Color: red, brown red, brown N/A Granulation Amount: Medium (34-66%) Medium (34-66%) N/A Granulation Quality: Red, Pink N/A N/A Necrotic Amount: Small (1-33%) Medium (34-66%) N/A Necrotic Tissue: Eschar, Adherent [place], Adherent Slough N/A Exposed Structures: Fat Layer (Subcutaneous Tissue): Fat Layer (Subcutaneous Tissue): N/A Yes Yes Fascia: No Fascia: No Tendon: No Tendon: No Muscle: No Muscle: No Joint: No Joint: No Bone: No Bone: No Epithelialization: None None N/A Treatment Notes Electronic Signature(s) Signed: 09/26/2021 3:39:51 PM By: Carlene Coria RN Entered By: Carlene Coria on 09/25/2021 13:11:05 [name], [name] ([id number]) [name], [name] ([id number]) -------------------------------------------------------------------------------- [name] L. Date of Service: 09/25/2021 12:30 PM Medical Record Number: [id number] Patient Account Number: [id number] Date of Birth/Sex: [birth year] (81 y.o. M) Treating RN: Carlene Coria Primary Care Laketia Vicknair: Jenna Luo Other Clinician: Referring Dirk Vanaman: Jenna Luo Treating Ramel Tobon/Extender: Skipper Cliche in Treatment: 17 Active Inactive Wound/Skin Impairment Nursing Diagnoses: Knowledge deficit related to ulceration/compromised skin integrity Goals: Patient/caregiver will verbalize understanding of skin care regimen Date Initiated:  05/24/2021 Target Resolution Date: 10/12/2021 Goal Status: Active Ulcer/skin breakdown will have a volume reduction of 30% by week 4 Date Initiated: 05/24/2021 Date Inactivated: 08/14/2021 Target Resolution Date: 07/24/2021 Goal Status: Unmet Unmet Reason: comorbites Ulcer/skin breakdown will have a volume reduction of 50% by week 8 Date Initiated: 05/24/2021 Date Inactivated: 09/25/2021 Target Resolution Date: 08/24/2021 Goal Status: Unmet Unmet Reason: comorbities Ulcer/skin breakdown will have a volume reduction of 80% by week 12 Date Initiated: 05/24/2021 Date Inactivated: 09/25/2021 Target Resolution Date: 09/24/2021 Goal Status: Unmet Unmet Reason: comorbities Ulcer/skin breakdown will heal within 14 weeks Date Initiated: 05/24/2021 Target Resolution Date: 10/22/2021 Goal Status: Active Interventions: Assess patient/caregiver ability to obtain necessary supplies Assess patient/caregiver ability to perform ulcer/skin care regimen upon admission and as needed Assess ulceration(s) every visit Notes: Electronic Signature(s) Signed: 09/26/2021 3:39:51 PM By: Carlene Coria RN Entered By: Carlene Coria on 09/25/2021 13:10:55 Whitsitt, Gid L. ([id number]) -------------------------------------------------------------------------------- Pain Assessment Details Patient Name: Laurey [name] L. Date of Service: 09/25/2021 12:30 PM Medical Record Number: [id number] Patient Account Number: [id number] Date of Birth/Sex: [birth year] (81 y.o. M) Treating RN: Carlene Coria  Primary Care Elma Limas: Jenna Luo Other Clinician: Referring Kiarah Eckstein: Jenna Luo Treating Aleathea Pugmire/Extender: Skipper Cliche in Treatment: 17 Active Problems Location of Pain Severity and Description of Pain Patient Has Paino No Site Locations Pain Management and Medication Current Pain Management: Electronic Signature(s) Signed: 09/26/2021 3:39:51 PM By: Carlene Coria RN Entered By: Carlene Coria on 09/25/2021  12:41:57 Albus, Wilman L. ([id number]) -------------------------------------------------------------------------------- Patient/Caregiver Education Details Patient Name: Laurey [name] L. Date of Service: 09/25/2021 12:30 PM Medical Record Number: [id number] Patient Account Number: [id number] Date of Birth/Gender: Jul 21, 1941 (81 y.o. M) Treating RN: Carlene Coria Primary Care Physician: Jenna Luo Other Clinician: Referring Physician: Jenna Luo Treating Physician/Extender: Skipper Cliche in Treatment: 22 Education Assessment Education Provided To: Patient Education Topics Provided Wound/Skin Impairment: Methods: Explain/Verbal Responses: State content correctly [employer]) Signed: 09/26/2021 3:39:51 PM By: Carlene Coria RN Entered By: Carlene Coria on 09/25/2021 13:13:27 Romano, Abran L. ([id number]) -------------------------------------------------------------------------------- Wound Assessment Details Patient Name: Walle, Elim L. Date of Service: 09/25/2021 12:30 PM Medical Record Number: [id number] Patient Account Number: [id number] Date of Birth/Sex: [birth year] (81 y.o. M) Treating RN: Carlene Coria Primary Care Dayan Kreis: Jenna Luo Other Clinician: Referring Illias Pantano: Jenna Luo Treating Jerilyn Gillaspie/Extender: Skipper Cliche in Treatment: 17 Wound Status Wound Number: 1 Primary Etiology: Calciphylaxis Wound Location: Left, Posterior Lower Leg Wound Status: Open Wounding Event: Gradually Appeared Comorbid History: Coronary Artery Disease, Type II Diabetes Date Acquired: 04/05/2021 Weeks Of Treatment: 17 Clustered Wound: Yes Photos Wound Measurements Length: (cm) 20 Width: (cm) 14 Depth: (cm) 0.2 Area: (cm) 219.911 Volume: (cm) 43.982 % Reduction in Area: -27.3% % Reduction in Volume: 36.4% Epithelialization: None Tunneling: No Undermining: No Wound Description Classification: Full Thickness Without Exposed Support  Structures Exudate Amount: Medium Exudate Type: Serosanguineous Exudate Color: red, brown Foul Odor After Cleansing: No Slough/Fibrino Yes Wound Bed Granulation Amount: Medium (34-66%) Exposed Structure Granulation Quality: Red, Pink Fascia Exposed: No Necrotic Amount: Small (1-33%) Fat Layer (Subcutaneous Tissue) Exposed: Yes Necrotic Quality: Eschar, Adherent Slough Tendon Exposed: No Muscle Exposed: No Joint Exposed: No Bone Exposed: No Treatment Notes Wound #1 (Lower Leg) Wound Laterality: Left, Posterior Cleanser Byram Ancillary Kit - 15 Day Supply Discharge Instruction: Use supplies as instructed; Kit contains: (15) Saline Bullets; (15) 3x3 Gauze; 15 pr Gloves Peri-Wound Care Gall[place], [place] ([id number]) Topical Primary Dressing Kerlix Roll Sterile, 4.5x4.1 (in/yd) Secondary Dressing Kerlix 4.5 x 4.1 (in/yd) Discharge Instruction: Apply Kerlix 4.5 x 4.1 (in/yd) as instructed Secured With 71M Medipore H Soft Cloth Surgical Tape, 2x2 (in/yd) Compression Wrap Compression Stockings Add-Ons Electronic Signature(s) Signed: 09/26/2021 3:39:51 PM By: Carlene Coria RN Entered By: Carlene Coria on 09/25/2021 12:53:57 Yust, Damaria L. ([id number]) -------------------------------------------------------------------------------- Wound Assessment Details Patient Name: Markowicz, Trae L. Date of Service: 09/25/2021 12:30 PM Medical Record Number: [id number] Patient Account Number: [id number] Date of Birth/Sex: [birth year] (81 y.o. M) Treating RN: Carlene Coria Primary Care Karlisa Gaubert: Jenna Luo Other Clinician: Referring Dontario Evetts: Jenna Luo Treating Matteson Blue/Extender: Skipper Cliche in Treatment: 17 Wound Status Wound Number: 2 Primary Etiology: Calciphylaxis Wound Location: Penis Wound Status: Open Wounding Event: Gradually Appeared Comorbid History: Coronary Artery Disease, Type II Diabetes Date Acquired: 05/05/2021 Weeks Of Treatment: 15 Clustered Wound:  No Photos Wound Measurements Length: (cm) 0.5 Width: (cm) 0.5 Depth: (cm) 0.1 Area: (cm) 0.196 Volume: (cm) 0.02 % Reduction in Area: 95.8% % Reduction in Volume: 95.8% Epithelialization: None Tunneling: No Undermining: No Wound Description Classification: Full Thickness Without Exposed Support Structures Exudate Amount: Medium Exudate Type: Serosanguineous Exudate Color: red, brown Foul Odor After Cleansing:  No Slough/Fibrino Yes Wound Bed Granulation Amount: Medium (34-66%) Exposed Structure Necrotic Amount: Medium (34-66%) Fascia Exposed: No Necrotic Quality: Eschar, Adherent Slough Fat Layer (Subcutaneous Tissue) Exposed: Yes Tendon Exposed: No Muscle Exposed: No Joint Exposed: No Bone Exposed: No Treatment Notes Wound #2 (Penis) Cleanser Byram Ancillary Kit - 15 Day Supply Discharge Instruction: Use supplies as instructed; Kit contains: (15) Saline Bullets; (15) 3x3 Gauze; 15 pr [place] ([id number]) Topical Primary Dressing Gauze Discharge Instruction: moistened with santyl Santyl Collagenase Ointment, 30 (gm), tube Secondary Dressing Secured With Compression Wrap Compression Stockings Add-Ons Electronic Signature(s) Signed: 09/26/2021 3:39:51 PM By: Carlene Coria RN Entered By: Carlene Coria on 09/25/2021 12:54:18 Risk, Akin L. ([id number]) -------------------------------------------------------------------------------- Vitals Details Patient Name: Laurey [name] L. Date of Service: 09/25/2021 12:30 PM Medical Record Number: [id number] Patient Account Number: [id number] Date of Birth/Sex: 14-Jan-1941 (81 y.o. M) Treating RN: Carlene Coria Primary Care Maisen Klingler: Jenna Luo Other Clinician: Referring Maneh Sieben: Jenna Luo Treating Zion Lint/Extender: Skipper Cliche in Treatment: 17 Vital Signs Time Taken: 12:41 Temperature (F): 98.2 Height (in): 68 Pulse (bpm): 96 Weight (lbs): 172 Respiratory Rate  (breaths/min): 18 Body Mass Index (BMI): 26.1 Blood Pressure (mmHg): 156/81 Reference Range: 80 - 120 mg / dl Electronic Signature(s) Signed: 09/26/2021 3:39:51 PM By: Carlene Coria RN Entered By: Carlene Coria on 09/25/2021 12:41:36

## 2021-09-30 DIAGNOSIS — A419 Sepsis, unspecified organism: Secondary | ICD-10-CM | POA: Diagnosis not present

## 2021-09-30 DIAGNOSIS — N186 End stage renal disease: Secondary | ICD-10-CM | POA: Diagnosis not present

## 2021-09-30 DIAGNOSIS — G9341 Metabolic encephalopathy: Secondary | ICD-10-CM | POA: Diagnosis not present

## 2021-09-30 DIAGNOSIS — N189 Chronic kidney disease, unspecified: Secondary | ICD-10-CM | POA: Diagnosis not present

## 2021-10-01 DIAGNOSIS — N2581 Secondary hyperparathyroidism of renal origin: Secondary | ICD-10-CM | POA: Diagnosis not present

## 2021-10-01 DIAGNOSIS — D689 Coagulation defect, unspecified: Secondary | ICD-10-CM | POA: Diagnosis not present

## 2021-10-01 DIAGNOSIS — Z992 Dependence on renal dialysis: Secondary | ICD-10-CM | POA: Diagnosis not present

## 2021-10-01 DIAGNOSIS — N186 End stage renal disease: Secondary | ICD-10-CM | POA: Diagnosis not present

## 2021-10-02 ENCOUNTER — Encounter: Payer: PPO | Admitting: Physician Assistant

## 2021-10-02 DIAGNOSIS — L97921 Non-pressure chronic ulcer of unspecified part of left lower leg limited to breakdown of skin: Secondary | ICD-10-CM | POA: Diagnosis not present

## 2021-10-02 DIAGNOSIS — Z794 Long term (current) use of insulin: Secondary | ICD-10-CM | POA: Diagnosis not present

## 2021-10-02 DIAGNOSIS — E113293 Type 2 diabetes mellitus with mild nonproliferative diabetic retinopathy without macular edema, bilateral: Secondary | ICD-10-CM | POA: Diagnosis not present

## 2021-10-02 DIAGNOSIS — E1142 Type 2 diabetes mellitus with diabetic polyneuropathy: Secondary | ICD-10-CM | POA: Diagnosis not present

## 2021-10-02 DIAGNOSIS — E1122 Type 2 diabetes mellitus with diabetic chronic kidney disease: Secondary | ICD-10-CM | POA: Diagnosis not present

## 2021-10-02 DIAGNOSIS — E1159 Type 2 diabetes mellitus with other circulatory complications: Secondary | ICD-10-CM | POA: Diagnosis not present

## 2021-10-02 DIAGNOSIS — N186 End stage renal disease: Secondary | ICD-10-CM | POA: Diagnosis not present

## 2021-10-02 DIAGNOSIS — Z992 Dependence on renal dialysis: Secondary | ICD-10-CM | POA: Diagnosis not present

## 2021-10-02 DIAGNOSIS — I10 Essential (primary) hypertension: Secondary | ICD-10-CM | POA: Diagnosis not present

## 2021-10-03 DIAGNOSIS — N186 End stage renal disease: Secondary | ICD-10-CM | POA: Diagnosis not present

## 2021-10-03 DIAGNOSIS — D689 Coagulation defect, unspecified: Secondary | ICD-10-CM | POA: Diagnosis not present

## 2021-10-03 DIAGNOSIS — E876 Hypokalemia: Secondary | ICD-10-CM | POA: Diagnosis not present

## 2021-10-03 DIAGNOSIS — N2581 Secondary hyperparathyroidism of renal origin: Secondary | ICD-10-CM | POA: Diagnosis not present

## 2021-10-03 DIAGNOSIS — Z992 Dependence on renal dialysis: Secondary | ICD-10-CM | POA: Diagnosis not present

## 2021-10-04 ENCOUNTER — Ambulatory Visit: Payer: PPO | Admitting: Physician Assistant

## 2021-10-07 ENCOUNTER — Other Ambulatory Visit: Payer: Self-pay | Admitting: Family Medicine

## 2021-10-08 DIAGNOSIS — E876 Hypokalemia: Secondary | ICD-10-CM | POA: Diagnosis not present

## 2021-10-08 DIAGNOSIS — Z992 Dependence on renal dialysis: Secondary | ICD-10-CM | POA: Diagnosis not present

## 2021-10-08 DIAGNOSIS — D689 Coagulation defect, unspecified: Secondary | ICD-10-CM | POA: Diagnosis not present

## 2021-10-08 DIAGNOSIS — N2581 Secondary hyperparathyroidism of renal origin: Secondary | ICD-10-CM | POA: Diagnosis not present

## 2021-10-08 DIAGNOSIS — N186 End stage renal disease: Secondary | ICD-10-CM | POA: Diagnosis not present

## 2021-10-08 DIAGNOSIS — D631 Anemia in chronic kidney disease: Secondary | ICD-10-CM | POA: Diagnosis not present

## 2021-10-09 ENCOUNTER — Encounter: Payer: PPO | Attending: Physician Assistant | Admitting: Physician Assistant

## 2021-10-09 ENCOUNTER — Other Ambulatory Visit: Payer: Self-pay

## 2021-10-09 DIAGNOSIS — L98492 Non-pressure chronic ulcer of skin of other sites with fat layer exposed: Secondary | ICD-10-CM | POA: Insufficient documentation

## 2021-10-09 DIAGNOSIS — Z992 Dependence on renal dialysis: Secondary | ICD-10-CM | POA: Diagnosis not present

## 2021-10-09 DIAGNOSIS — N4889 Other specified disorders of penis: Secondary | ICD-10-CM | POA: Insufficient documentation

## 2021-10-09 DIAGNOSIS — N186 End stage renal disease: Secondary | ICD-10-CM | POA: Diagnosis not present

## 2021-10-09 DIAGNOSIS — L97822 Non-pressure chronic ulcer of other part of left lower leg with fat layer exposed: Secondary | ICD-10-CM | POA: Insufficient documentation

## 2021-10-09 DIAGNOSIS — E1122 Type 2 diabetes mellitus with diabetic chronic kidney disease: Secondary | ICD-10-CM | POA: Insufficient documentation

## 2021-10-09 DIAGNOSIS — N184 Chronic kidney disease, stage 4 (severe): Secondary | ICD-10-CM | POA: Diagnosis not present

## 2021-10-09 DIAGNOSIS — L97222 Non-pressure chronic ulcer of left calf with fat layer exposed: Secondary | ICD-10-CM | POA: Diagnosis not present

## 2021-10-09 DIAGNOSIS — I2510 Atherosclerotic heart disease of native coronary artery without angina pectoris: Secondary | ICD-10-CM | POA: Diagnosis not present

## 2021-10-09 NOTE — Progress Notes (Addendum)
ROLLA, KEDZIERSKI ([id number]) Visit Report for 10/09/2021 Arrival Information Details Patient Name: [name], [name]. Date of Service: 10/09/2021 1:15 PM Medical Record Number: [id number] Patient Account Number: [id number] Date of Birth/Sex: [birth year] (81 y.o. M) Treating RN: Carlene Coria Primary Care Raine Blodgett: Jenna Luo Other Clinician: Referring Lyah Millirons: Jenna Luo Treating Damoney Julia/Extender: Skipper Cliche in Treatment: 19 Visit Information History Since Last Visit All ordered tests and consults were completed: No Patient Arrived: [name] Added or deleted any medications: No Arrival Time: 13:19 Any new allergies or adverse reactions: No Accompanied By: wife Had a fall or experienced change in No Transfer Assistance: None activities of daily living that may affect Patient Identification Verified: Yes risk of falls: Secondary Verification Process Completed: Yes Signs or symptoms of abuse/neglect since last visito No Patient Requires Transmission-Based Precautions: No Hospitalized since last visit: No Patient Has Alerts: No Implantable device outside of the clinic excluding No cellular tissue based products placed in the center since last visit: Has Dressing in Place as Prescribed: Yes Pain Present Now: No Electronic Signature(s) Signed: 10/09/2021 4:20:37 PM By: Carlene Coria RN Entered By: Carlene Coria on 10/09/2021 13:24:03 Hillyard, [name]. ([id number]) -------------------------------------------------------------------------------- Clinic Level of Care Assessment Details Patient Name: Mark Morale [name]. Date of Service: 10/09/2021 1:15 PM Medical Record Number: [id number] Patient Account Number: [id number] Date of Birth/Sex: 26-Jun-1941 (81 y.o. M) Treating RN: Carlene Coria Primary Care Terricka Onofrio: Jenna Luo Other Clinician: Referring Meriel Kelliher: Jenna Luo Treating Adanely Reynoso/Extender: Skipper Cliche in Treatment: 19 Clinic Level of Care Assessment  Items TOOL 4 Quantity Score X - Use when only an EandM is performed on FOLLOW-UP visit 1 0 ASSESSMENTS - Nursing Assessment / Reassessment X - Reassessment of Co-morbidities (includes updates in patient status) 1 10 X- 1 5 Reassessment of Adherence to Treatment Plan ASSESSMENTS - Wound and Skin Assessment / Reassessment X - Simple Wound Assessment / Reassessment - one wound 1 5 []  - 0 Complex Wound Assessment / Reassessment - multiple wounds []  - 0 Dermatologic / Skin Assessment (not related to wound area) ASSESSMENTS - Focused Assessment []  - Circumferential Edema Measurements - multi extremities 0 []  - 0 Nutritional Assessment / Counseling / Intervention []  - 0 Lower Extremity Assessment (monofilament, tuning fork, pulses) []  - 0 Peripheral Arterial Disease Assessment (using hand held doppler) ASSESSMENTS - Ostomy and/or Continence Assessment and Care []  - Incontinence Assessment and Management 0 []  - 0 Ostomy Care Assessment and Management (repouching, etc.) PROCESS - Coordination of Care X - Simple Patient / Family Education for ongoing care 1 15 []  - 0 Complex (extensive) Patient / Family Education for ongoing care []  - 0 Staff obtains Programmer, systems, Records, Test Results / Process Orders []  - 0 Staff telephones HHA, Nursing Homes / Clarify orders / etc []  - 0 Routine Transfer to another Facility (non-emergent condition) []  - 0 Routine Hospital Admission (non-emergent condition) []  - 0 New Admissions / Biomedical engineer / Ordering NPWT, Apligraf, etc. []  - 0 Emergency Hospital Admission (emergent condition) X- 1 10 Simple Discharge Coordination []  - 0 Complex (extensive) Discharge Coordination PROCESS - Special Needs []  - Pediatric / Minor Patient Management 0 []  - 0 Isolation Patient Management []  - 0 Hearing / Language / Visual special needs []  - 0 Assessment of Community assistance (transportation, D/C planning, etc.) []  - 0 Additional assistance /  Altered mentation []  - 0 Support Surface(s) Assessment (bed, cushion, seat, etc.) INTERVENTIONS - Wound Cleansing / Measurement Cordner, Escher [name]. ([id number]) []  - 0 Simple Wound Cleansing -  one wound X- 2 5 Complex Wound Cleansing - multiple wounds X- 1 5 Wound Imaging (photographs - any number of wounds) []  - 0 Wound Tracing (instead of photographs) []  - 0 Simple Wound Measurement - one wound X- 2 5 Complex Wound Measurement - multiple wounds INTERVENTIONS - Wound Dressings X - Small Wound Dressing one or multiple wounds 1 10 X- 1 15 Medium Wound Dressing one or multiple wounds []  - 0 Large Wound Dressing one or multiple wounds []  - 0 Application of Medications - topical []  - 0 Application of Medications - injection INTERVENTIONS - Miscellaneous []  - External ear exam 0 []  - 0 Specimen Collection (cultures, biopsies, blood, body fluids, etc.) []  - 0 Specimen(s) / Culture(s) sent or taken to Lab for analysis []  - 0 Patient Transfer (multiple staff / Civil Service fast streamer / Similar devices) []  - 0 Simple Staple / Suture removal (25 or less) []  - 0 Complex Staple / Suture removal (26 or more) []  - 0 Hypo / Hyperglycemic Management (close monitor of Blood Glucose) []  - 0 Ankle / Brachial Index (ABI) - do not check if billed separately X- 1 5 Vital Signs Has the patient been seen at the hospital within the last three years: Yes Total Score: 100 Level Of Care: New/Established - Level 3 Electronic Signature(s) Signed: 10/09/2021 4:20:37 PM By: Carlene Coria RN Entered By: Carlene Coria on 10/09/2021 13:45:36 [name], [name]. ([id number]) -------------------------------------------------------------------------------- Encounter Discharge Information Details Patient Name: Mark Morale [name]. Date of Service: 10/09/2021 1:15 PM Medical Record Number: [id number] Patient Account Number: [id number] Date of Birth/Sex: [birth year] (81 y.o. M) Treating RN: Carlene Coria Primary Care  Ladonna Vanorder: Jenna Luo Other Clinician: Referring Remo Kirschenmann: Jenna Luo Treating Lyriq Finerty/Extender: Skipper Cliche in Treatment: 19 Encounter Discharge Information Items Discharge Condition: Stable Ambulatory Status: Ambulatory Discharge Destination: Home Transportation: Private Auto Accompanied By: self Schedule Follow-up Appointment: Yes Clinical Summary of Care: Patient Declined Electronic Signature(s) Signed: 10/09/2021 1:57:45 PM By: Carlene Coria RN Entered By: Carlene Coria on 10/09/2021 13:57:45 [name], [name]. ([id number]) -------------------------------------------------------------------------------- Lower Extremity Assessment Details Patient Name: [name], [name]. Date of Service: 10/09/2021 1:15 PM Medical Record Number: [id number] Patient Account Number: [id number] Date of Birth/Sex: [birth year] (81 y.o. M) Treating RN: Carlene Coria Primary Care Keyatta Tolles: Jenna Luo Other Clinician: Referring Giuliano Preece: Jenna Luo Treating Toney Lizaola/Extender: Skipper Cliche in Treatment: 19 Edema Assessment Assessed: [Left: No] Patrice Paradise: Yes] Edema: [Left: Ye] [Right: s] Calf Left: Right: Point of Measurement: 34 cm From Medial Instep 31 cm Ankle Left: Right: Point of Measurement: 10 cm From Medial Instep 21 cm Vascular Assessment Pulses: Dorsalis Pedis Palpable: [Left:Yes] Electronic Signature(s) Signed: 10/09/2021 4:20:37 PM By: Carlene Coria RN Entered By: Carlene Coria on 10/09/2021 13:35:38 [name], Mark ([id number]) -------------------------------------------------------------------------------- Multi Wound Chart Details Patient Name: Mark Morale [name]. Date of Service: 10/09/2021 1:15 PM Medical Record Number: [id number] Patient Account Number: [id number] Date of Birth/Sex: Oct 08, 1940 (81 y.o. M) Treating RN: Carlene Coria Primary Care Miklos Bidinger: Jenna Luo Other Clinician: Referring Roxine Whittinghill: Jenna Luo Treating Boniface Goffe/Extender: Skipper Cliche in Treatment: 19 Vital Signs Height(in): 68 Pulse(bpm): 42 Weight(lbs): 172 Blood Pressure(mmHg): 152/73 Body Mass Index(BMI): 26.1 Temperature(F): 98 Respiratory Rate(breaths/min): 18 Photos: [N/A:N/A] Wound Location: Left, Posterior Lower Leg Penis N/A Wounding Event: Gradually Appeared Gradually Appeared N/A Primary Etiology: Calciphylaxis Calciphylaxis N/A Comorbid History: Coronary Artery Disease, Type II Coronary Artery Disease, Type II N/A Diabetes Diabetes Date Acquired: 04/05/2021 05/05/2021 N/A Weeks of Treatment: 19 17 N/A Wound Status: Open Open N/A Wound Recurrence: No  No N/A Clustered Wound: Yes No N/A Measurements [name] x W x D (cm) 20x14x0.2 0.4x0.4x0.1 N/A Area (cm) : 219.911 0.126 N/A Volume (cm) : 43.982 0.013 N/A % Reduction in Area: -27.30% 97.30% N/A % Reduction in Volume: 36.40% 97.20% N/A Classification: Full Thickness Without Exposed Full Thickness Without Exposed N/A Support Structures Support Structures Exudate Amount: Medium Medium N/A Exudate Type: Serosanguineous Serosanguineous N/A Exudate Color: red, brown red, brown N/A Granulation Amount: Medium (34-66%) Medium (34-66%) N/A Granulation Quality: Red, Pink N/A N/A Necrotic Amount: Small (1-33%) Medium (34-66%) N/A Necrotic Tissue: Eschar, Adherent [place] N/A Exposed Structures: Fat Layer (Subcutaneous Tissue): Fat Layer (Subcutaneous Tissue): N/A Yes Yes Fascia: No Fascia: No Tendon: No Tendon: No Muscle: No Muscle: No Joint: No Joint: No Bone: No Bone: No Epithelialization: None None N/A Treatment Notes Electronic Signature(s) Signed: 10/09/2021 4:20:37 PM By: Carlene Coria RN Entered By: Carlene Coria on 10/09/2021 13:36:21 [name], [name]. ([id number]) Brickhouse, Mark Carlean Jews ([id number]) -------------------------------------------------------------------------------- Snowflake Details Patient Name: Mark Morale [name]. Date of  Service: 10/09/2021 1:15 PM Medical Record Number: [id number] Patient Account Number: [id number] Date of Birth/Sex: [birth year] (81 y.o. M) Treating RN: Carlene Coria Primary Care Kaula Klenke: Jenna Luo Other Clinician: Referring Keveon Amsler: Jenna Luo Treating Delton Stelle/Extender: Skipper Cliche in Treatment: 19 Active Inactive Wound/Skin Impairment Nursing Diagnoses: Knowledge deficit related to ulceration/compromised skin integrity Goals: Patient/caregiver will verbalize understanding of skin care regimen Date Initiated: 05/24/2021 Target Resolution Date: 10/12/2021 Goal Status: Active Ulcer/skin breakdown will have a volume reduction of 30% by week 4 Date Initiated: 05/24/2021 Date Inactivated: 08/14/2021 Target Resolution Date: 07/24/2021 Goal Status: Unmet Unmet Reason: comorbites Ulcer/skin breakdown will have a volume reduction of 50% by week 8 Date Initiated: 05/24/2021 Date Inactivated: 09/25/2021 Target Resolution Date: 08/24/2021 Goal Status: Unmet Unmet Reason: comorbities Ulcer/skin breakdown will have a volume reduction of 80% by week 12 Date Initiated: 05/24/2021 Date Inactivated: 09/25/2021 Target Resolution Date: 09/24/2021 Goal Status: Unmet Unmet Reason: comorbities Ulcer/skin breakdown will heal within 14 weeks Date Initiated: 05/24/2021 Target Resolution Date: 10/22/2021 Goal Status: Active Interventions: Assess patient/caregiver ability to obtain necessary supplies Assess patient/caregiver ability to perform ulcer/skin care regimen upon admission and as needed Assess ulceration(s) every visit Notes: Electronic Signature(s) Signed: 10/09/2021 4:20:37 PM By: Carlene Coria RN Entered By: Carlene Coria on 10/09/2021 13:35:51 [name], [name]. ([id number]) -------------------------------------------------------------------------------- Pain Assessment Details Patient Name: Mark Morale [name]. Date of Service: 10/09/2021 1:15 PM Medical Record Number:  [id number] Patient Account Number: [id number] Date of Birth/Sex: 12/28/1940 (81 y.o. M) Treating RN: Carlene Coria Primary Care Yee Joss: Jenna Luo Other Clinician: Referring Sreeja Spies: Jenna Luo Treating Shivaan Tierno/Extender: Skipper Cliche in Treatment: 19 Active Problems Location of Pain Severity and Description of Pain Patient Has Paino No Site Locations Pain Management and Medication Current Pain Management: Electronic Signature(s) Signed: 10/09/2021 4:20:37 PM By: Carlene Coria RN Entered By: Carlene Coria on 10/09/2021 13:24:38 Agena, Felicia [name]. ([id number]) -------------------------------------------------------------------------------- Patient/Caregiver Education Details Patient Name: Mark Morale [name]. Date of Service: 10/09/2021 1:15 PM Medical Record Number: [id number] Patient Account Number: [id number] Date of Birth/Gender: 1941/05/16 (81 y.o. M) Treating RN: Carlene Coria Primary Care Physician: Jenna Luo Other Clinician: Referring Physician: Jenna Luo Treating Physician/Extender: Skipper Cliche in Treatment: 53 Education Assessment Education Provided To: Patient Education Topics Provided Wound/Skin Impairment: Methods: Explain/Verbal Responses: State content correctly [employer]) Signed: 10/09/2021 4:20:37 PM By: Carlene Coria RN Entered By: Carlene Coria on 10/09/2021 13:45:57 Grabert, Aiven [name]. ([id number]) -------------------------------------------------------------------------------- Wound Assessment Details Patient Name: Pereda, Ferdie [name].  Date of Service: 10/09/2021 1:15 PM Medical Record Number: [id number] Patient Account Number: [id number] Date of Birth/Sex: 09/11/1940 (81 y.o. M) Treating RN: Carlene Coria Primary Care Keyasia Jolliff: Jenna Luo Other Clinician: Referring Girtha Kilgore: Jenna Luo Treating Letcher Schweikert/Extender: Skipper Cliche in Treatment: 19 Wound Status Wound Number: 1 Primary Etiology: Calciphylaxis Wound  Location: Left, Posterior Lower Leg Wound Status: Open Wounding Event: Gradually Appeared Comorbid History: Coronary Artery Disease, Type II Diabetes Date Acquired: 04/05/2021 Weeks Of Treatment: 19 Clustered Wound: Yes Photos Wound Measurements Length: (cm) 20 Width: (cm) 14 Depth: (cm) 0.2 Area: (cm) 219.911 Volume: (cm) 43.982 % Reduction in Area: -27.3% % Reduction in Volume: 36.4% Epithelialization: None Tunneling: No Undermining: No Wound Description Classification: Full Thickness Without Exposed Support Structures Exudate Amount: Medium Exudate Type: Serosanguineous Exudate Color: red, brown Foul Odor After Cleansing: No Slough/Fibrino Yes Wound Bed Granulation Amount: Medium (34-66%) Exposed Structure Granulation Quality: Red, Pink Fascia Exposed: No Necrotic Amount: Small (1-33%) Fat Layer (Subcutaneous Tissue) Exposed: Yes Necrotic Quality: Eschar, Adherent Slough Tendon Exposed: No Muscle Exposed: No Joint Exposed: No Bone Exposed: No Treatment Notes Wound #1 (Lower Leg) Wound Laterality: Left, Posterior Cleanser [place]ram Ancillary Kit - 15 Day Supply Discharge Instruction: Use supplies as instructed; Kit contains: (15) Saline Bullets; (15) 3x3 Gauze; 15 pr Gloves Peri-Wound Care Everage, [place] ([id number]) Topical Primary Dressing Kerlix Roll Sterile, 4.5x4.1 (in/yd) Secondary Dressing Kerlix 4.5 x 4.1 (in/yd) Discharge Instruction: Apply Kerlix 4.5 x 4.1 (in/yd) as instructed Secured With Medipore Tape - 7M Medipore H Soft Cloth Surgical Tape, 2x2 (in/yd) Compression Wrap Compression Stockings Add-Ons Electronic Signature(s) Signed: 10/09/2021 4:20:37 PM By: Carlene Coria RN Entered By: Carlene Coria on 10/09/2021 13:33:13 Dobek, Geordie [name]. ([id number]) -------------------------------------------------------------------------------- Wound Assessment Details Patient Name: Dirks, Borna [name]. Date of Service: 10/09/2021 1:15 PM Medical Record Number:  [id number] Patient Account Number: [id number] Date of Birth/Sex: April 15, 1941 (81 y.o. M) Treating RN: Carlene Coria Primary Care Jaythen Hamme: Jenna Luo Other Clinician: Referring Orson Rho: Jenna Luo Treating Iysha Mishkin/Extender: Skipper Cliche in Treatment: 19 Wound Status Wound Number: 2 Primary Etiology: Calciphylaxis Wound Location: Penis Wound Status: Open Wounding Event: Gradually Appeared Comorbid History: Coronary Artery Disease, Type II Diabetes Date Acquired: 05/05/2021 Weeks Of Treatment: 17 Clustered Wound: No Photos Wound Measurements Length: (cm) 0.4 Width: (cm) 0.4 Depth: (cm) 0.1 Area: (cm) 0.126 Volume: (cm) 0.013 % Reduction in Area: 97.3% % Reduction in Volume: 97.2% Epithelialization: None Tunneling: No Undermining: No Wound Description Classification: Full Thickness Without Exposed Support Structures Exudate Amount: Medium Exudate Type: Serosanguineous Exudate Color: red, brown Foul Odor After Cleansing: No Slough/Fibrino Yes Wound Bed Granulation Amount: Medium (34-66%) Exposed Structure Necrotic Amount: Medium (34-66%) Fascia Exposed: No Necrotic Quality: Eschar, Adherent Slough Fat Layer (Subcutaneous Tissue) Exposed: Yes Tendon Exposed: No Muscle Exposed: No Joint Exposed: No Bone Exposed: No Treatment Notes Wound #2 (Penis) Cleanser Byram Ancillary Kit - 15 Day Supply Discharge Instruction: Use supplies as instructed; Kit contains: (15) Saline Bullets; (15) 3x3 Gauze; 15 pr Gloves Peri-Wound Care Tremain, [place] ([id number]) Topical Primary Dressing Gauze Discharge Instruction: moistened with santyl Santyl Collagenase Ointment, 30 (gm), tube Secondary Dressing Secured With Compression Wrap Compression Stockings Add-Ons Electronic Signature(s) Signed: 10/09/2021 4:20:37 PM By: Carlene Coria RN Entered By: Carlene Coria on 10/09/2021 13:33:39 Andreason, Bernabe [name].  ([id number]) -------------------------------------------------------------------------------- Vitals Details Patient Name: Mark Morale [name]. Date of Service: 10/09/2021 1:15 PM Medical Record Number: [id number] Patient Account Number: [id number] Date of Birth/Sex: 04/01/1941 (81 y.o. M) Treating RN: Carlene Coria Primary Care Jannatul Wojdyla: Jenna Luo  Other Clinician: Referring Geroge Gilliam: Jenna Luo Treating Jacklin Zwick/Extender: Skipper Cliche in Treatment: 19 Vital Signs Time Taken: 13:24 Temperature (F): 98 Height (in): 68 Pulse (bpm): 73 Weight (lbs): 172 Respiratory Rate (breaths/min): 18 Body Mass Index (BMI): 26.1 Blood Pressure (mmHg): 152/73 Reference Range: 80 - 120 mg / dl Electronic Signature(s) Signed: 10/09/2021 4:20:37 PM By: Carlene Coria RN Entered By: Carlene Coria on 10/09/2021 13:24:27

## 2021-10-09 NOTE — Progress Notes (Addendum)
AREK, SPADAFORE ([id number]) Visit Report for 10/09/2021 Chief Complaint Document Details Patient Name: [name], [name]. Date of Service: 10/09/2021 1:15 PM Medical Record Number: [id number] Patient Account Number: [id number] Date of Birth/Sex: 01-30-1941 (81 y.o. M) Treating RN: Carlene Coria Primary Care Provider: Jenna Luo Other Clinician: Referring Provider: Jenna Luo Treating Provider/Extender: Skipper Cliche in Treatment: 19 Information Obtained from: Patient Chief Complaint Left LE Calciphylaxis and Glans Penis Calciphylaxis Electronic Signature(s) Signed: 10/09/2021 1:02:10 PM By: Worthy Keeler PA-C Entered By: Worthy Keeler on 10/09/2021 13:02:10 Wilhoite, Jermall L. ([id number]) -------------------------------------------------------------------------------- HPI Details Patient Name: [name] L. Date of Service: 10/09/2021 1:15 PM Medical Record Number: [id number] Patient Account Number: [id number] Date of Birth/Sex: 01/18/1941 (81 y.o. 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 This has been present for a couple of months currently he is on the sodium thiosulfate at dialysis. He tells me that they picked up on this quickly and have been managing it but he really has not had any help with anything else from the standpoint of progressing the wound itself. The patient does have again end-stage renal disease which currently is listed as stage IV but he is on renal dialysis. He also has coronary artery disease as well as going shortly for formal arterial studies although he did have a quick test  of the tibial artery with his vascular doctor when he went in to check on his fistula for his arm and subsequently they did not feel like he had any hemodynamically unstable flow into the lower extremity this is good news. Nonetheless I think that the biggest issue here is going to be getting some of the eschar off so that we get the wounds med headed in the appropriate direction. 05/31/2021 upon evaluation today patient appears to be doing well with regard to his leg ulceration with calciphylaxis. I am very pleased in this regard. Fortunately there does not appear to be any signs of active infection at this time which is great news. No fevers, chills, nausea, vomiting, or diarrhea. 06/07/2021 upon evaluation today patient appears to be doing well with regard to his legs. Unfortunately he is having an issue here with his penis as well he did mention at the end of last visit. I suggested that he go see urology. However when he went to see the urologist they basically told him there was not anything they could do. They recommended that we needed to be the ones to take care of this. With that being said this is an unusual area that to be honest a lot of the traditional wound care products we use are not really good to be good for this region. Potentially Medihoney alginate could be a possibility also think Santyl could be a possibility. With that being said I think initially my suggestion is probably can be for [place] to attempt the Santyl to see if that will benefit the patient. 06/21/2021 upon evaluation today patient's wound bed actually showed signs of good granulation and epithelization at this point. Fortunately I do not see any signs of active infection systemically which is great news and his legs are doing awesome I think you are making great progress. The Santyl on  the glans penis also has been of benefit this is looking tremendously better. Again the patient is pleased he also tells me the pain  is significantly improved. Overall I think we are headed in the appropriate direction here. 07/05/2021 upon evaluation today patient appears to be doing well with regard to his wounds. Both the area on the tip of his penis as well as the left lateral leg is doing well. There is some debridement this can be needed over the leg region. Fortunately I think that overall the Dakin's moistened gauze has done a great job here. No fevers, chills, nausea, vomiting, or diarrhea. 07/12/2021 upon evaluation today patient presents for reevaluation here in the clinic and overall I think that his wounds are doing significantly better which is great news. This includes both the left leg as well as the glans of the penis. Both are showing signs of significant improvement which is great and overall I think that we are headed in the right direction. I do not see any evidence of active infection at this moment. 07/19/2021 upon evaluation today patient appears to be doing better in regard to his wounds. Were getting much closer to complete resolution in regard to the necrotic tissue being removed from the leg. Overall I am extremely pleased with where we stand today. Overall I do not see any signs of active infection at this time. With regard to the patient's penis this area is showing signs of improvement as well and I am very pleased with that. 07/26/2021 upon evaluation today patient appears to be doing well with regard to his wounds. The leg in fact is looking quite well and I am very pleased with where we stand today. I do not see any signs of infection which is great and there is a lot of new granulation growth also also. With regard to the penis area this also showed signs of excellent improvement which is great news. 08/14/2020 upon evaluation today patient appears to be doing well with regard to his wounds both the leg as well as the glans penis region. Both are showing signs of improvement which is great news and  overall I am extremely pleased with where we stand at this point. I think that the Dakin's moistened gauze dressing for her leg is doing great and the Santyl is definitely helping with the glans penis location. 08/28/2021 upon evaluation today patient appears to be doing well with regard to his wounds on the left leg as well as the penis. Fortunately both are showing signs of improvement. I am going to perform some debridement in regard to the leg there is a small area which is having trouble getting completely clean my work on that a little bit more today. 09/04/2021 upon evaluation today patient appears to be doing pretty well in regard to his wounds. 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 Again he has I just found out today been off of the sodium thiosulfate for about 2 to 3 weeks. He tells me that the initial "3 weeks" was stated to be up by the nurse. With that being said I think he probably is going to need it for a bit longer to be honest. We discussed that today. Over the penis area the Santyl does seem to be loose and a lot of this up and that is good hopefully will be able  to get this completely cleared off it sometime shortly. 09/13/2021 upon evaluation today patient appears to be doing well with regard to his wound. He has been tolerating the dressing changes without complication. Fortunately there does not appear to be any signs of active infection locally or systemically which is great news. Overall I think that he is making progress with regard to his wounds in general. 09/25/2021 upon evaluation today patient appears to be doing excellent in regard to his wounds. I am actually extremely pleased with where we stand today and I think he is making great progress. The patient likewise is happy to hear this. 10/09/2021 upon evaluation today patient actually appears to be doing excellent in  regard to his wound. He has been tolerating the dressing Alvis, Trase L. ([id number]) changes without complication. Fortunately I do not see any evidence of active infection locally or systemically which is great news. No fevers, chills, nausea, vomiting, or diarrhea. Electronic Signature(s) Signed: 10/09/2021 2:09:32 PM By: Worthy Keeler PA-C Entered By: Worthy Keeler on 10/09/2021 14:09:31 Burklow, [name] ([id number]) -------------------------------------------------------------------------------- Physical Exam Details Patient Name: Olund, Sunny L. Date of Service: 10/09/2021 1:15 PM Medical Record Number: [id number] Patient Account Number: [id number] Date of Birth/Sex: 1941/07/15 (81 y.o. M) Treating RN: Carlene Coria Primary Care Provider: Jenna Luo Other Clinician: Referring Provider: Jenna Luo Treating Provider/Extender: Skipper Cliche in Treatment: 30 Constitutional Well-nourished and well-hydrated in no acute distress. Respiratory normal breathing without difficulty. Psychiatric this patient is able to make decisions and demonstrates good insight into disease process. Alert and Oriented x 3. pleasant and cooperative. Notes Upon inspection patient's wounds again both on the penis as well as his left leg are showing signs of excellent improvement. I am extremely pleased with where we stand and overall I think that the patient is making great progress. Electronic Signature(s) Signed: 10/09/2021 2:09:47 PM By: Worthy Keeler PA-C Entered By: Worthy Keeler on 10/09/2021 14:09:46 Alarie, [name] ([id number]) -------------------------------------------------------------------------------- Physician Orders Details Patient Name: [name] L. Date of Service: 10/09/2021 1:15 PM Medical Record Number: [id number] Patient Account Number: [id number] Date of Birth/Sex: June 15, 1941 (81 y.o. 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 Edema Control - Lymphedema / Segmental Compressive Device / Other o Elevate, Exercise Daily and Avoid Standing for Long Periods of Time. o Elevate legs to the level of the heart and pump ankles as often as possible o Elevate leg(s) parallel to the floor when sitting. Wound Treatment Wound #1 - Lower Leg Wound Laterality: Left, Posterior Cleanser: Byram Ancillary Kit - 15 Day Supply (Generic) 1 x Per Day/30 Days Discharge Instructions: Use supplies as instructed; Kit contains: (15) Saline Bullets; (15) 3x3 Gauze; 15 pr Gloves Primary Dressing: Kerlix Roll Sterile, 4.5x4.1 (in/yd) 1 x Per Day/30 Days Secondary Dressing: Kerlix 4.5 x 4.1 (in/yd) (Generic) 1 x Per Day/30 Days Discharge Instructions: Apply Kerlix 4.5 x 4.1 (in/yd) as instructed Secured With: Elmer  Soft Cloth Surgical Tape, 2x2 (in/yd) (Generic) 1 x Per Day/30 Days Wound #2 - Penis Cleanser: Byram Ancillary Kit - 15 Day Supply (Generic) 1 x Per Day/30 Days Discharge Instructions: Use supplies as instructed; Kit contains: (15) Saline Bullets; (15) 3x3 Gauze; 15 pr Gloves Primary Dressing: Gauze (Generic) 1 x Per Day/30 Days Discharge Instructions: moistened with santyl Primary Dressing: Santyl Collagenase Ointment, 30 (gm), tube 1 x Per Day/30 Days Electronic  Signature(s) Signed: 10/09/2021 4:20:37 PM By: Carlene Coria RN Signed: 10/09/2021 7:37:05 PM By: Worthy Keeler PA-C Entered By: Carlene Coria on 10/09/2021 13:44:27 Carsey, Hymen L. ([id number]) -------------------------------------------------------------------------------- Problem List Details Patient Name: Friesenhahn, Jaziah L. Date of Service: 10/09/2021 1:15 PM Medical Record Number: [id number] Patient Account Number: [id number] Date of Birth/Sex: 1941-07-02 (81 y.o. M) Treating RN: Carlene Coria Primary Care Provider: Jenna Luo Other Clinician: Referring Provider: Jenna Luo Treating Provider/Extender: Skipper Cliche in Treatment: 19 Active Problems ICD-10 Encounter Code Description Active Date MDM Diagnosis E83.50 Unspecified disorder of calcium metabolism 05/24/2021 No Yes L97.822 Non-pressure chronic ulcer of other part of left lower leg with fat layer 05/24/2021 No Yes exposed L98.492 Non-pressure chronic ulcer of skin of other sites with fat layer exposed 06/07/2021 No Yes Z99.2 Dependence on renal dialysis 05/24/2021 No Yes N18.4 Chronic kidney disease, stage 4 (severe) 05/24/2021 No Yes I25.10 Atherosclerotic heart disease of native coronary artery without angina 05/24/2021 No Yes pectoris Inactive Problems Resolved Problems Electronic Signature(s) Signed: 10/09/2021 1:02:04 PM By: Worthy Keeler PA-C Entered By: Worthy Keeler on 10/09/2021 13:02:04 Ratchford, Felipe L. ([id number]) -------------------------------------------------------------------------------- Progress Note Details Patient Name: Saetern, Kaulin L. Date of Service: 10/09/2021 1:15 PM Medical Record Number: [id number] Patient Account Number: [id number] Date of Birth/Sex: 1941/07/22 (81 y.o. 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 This has been present for a couple of months currently he is on the sodium thiosulfate at dialysis. He tells me that they picked up on this quickly and have been managing it but he really has not had any help with anything else from the standpoint of progressing the wound itself. The patient does have again end-stage renal disease which currently is listed as stage IV but he is on renal dialysis. He also has coronary artery disease as well as going shortly for formal arterial studies although he did have a quick test of the tibial artery with his vascular doctor when he went in to check on his fistula for his arm and subsequently they did not feel like he had any hemodynamically unstable flow into the lower extremity this is good news. Nonetheless I think that the biggest issue here is going to be getting some of the eschar off so that we get the wounds med headed in the appropriate direction. 05/31/2021 upon evaluation today patient appears to be doing well with regard to his leg ulceration with calciphylaxis. I am very pleased in this regard. Fortunately there does not appear to be any signs of active infection at this time which is great news. No fevers, chills, nausea, vomiting, or diarrhea. 06/07/2021 upon evaluation today patient appears to be doing well with regard  to his legs. Unfortunately he is having an issue here with his penis as well he did mention at the end of last visit. I suggested that he go see urology. However when he went to see the urologist they basically told him there was not anything they could do. They recommended that we needed to be the ones to take care of  this. With that being said this is an unusual area that to be honest a lot of the traditional wound care products we use are not really good to be good for this region. Potentially Medihoney alginate could be a possibility also think Santyl could be a possibility. With that being said I think initially my suggestion is probably can be for [place] to attempt the Santyl to see if that will benefit the patient. 06/21/2021 upon evaluation today patient's wound bed actually showed signs of good granulation and epithelization at this point. Fortunately I do not see any signs of active infection systemically which is great news and his legs are doing awesome I think you are making great progress. The Santyl on the glans penis also has been of benefit this is looking tremendously better. Again the patient is pleased he also tells me the pain is significantly improved. Overall I think we are headed in the appropriate direction here. 07/05/2021 upon evaluation today patient appears to be doing well with regard to his wounds. Both the area on the tip of his penis as well as the left lateral leg is doing well. There is some debridement this can be needed over the leg region. Fortunately I think that overall the Dakin's moistened gauze has done a great job here. No fevers, chills, nausea, vomiting, or diarrhea. 07/12/2021 upon evaluation today patient presents for reevaluation here in the clinic and overall I think that his wounds are doing significantly better which is great news. This includes both the left leg as well as the glans of the penis. Both are showing signs of significant improvement which is great and overall I think that we are headed in the right direction. I do not see any evidence of active infection at this moment. 07/19/2021 upon evaluation today patient appears to be doing better in regard to his wounds. Were getting much closer to complete resolution in regard to the necrotic tissue being removed  from the leg. Overall I am extremely pleased with where we stand today. Overall I do not see any signs of active infection at this time. With regard to the patient's penis this area is showing signs of improvement as well and I am very pleased with that. 07/26/2021 upon evaluation today patient appears to be doing well with regard to his wounds. The leg in fact is looking quite well and I am very pleased with where we stand today. I do not see any signs of infection which is great and there is a lot of new granulation growth also also. With regard to the penis area this also showed signs of excellent improvement which is great news. 08/14/2020 upon evaluation today patient appears to be doing well with regard to his wounds both the leg as well as the glans penis region. Both are showing signs of improvement which is great news and overall I am extremely pleased with where we stand at this point. I think that the Dakin's moistened gauze dressing for her leg is doing great and the Santyl is definitely helping with the glans penis location. 08/28/2021 upon evaluation today patient appears to  be doing well with regard to his wounds on the left leg as well as the penis. Fortunately both are showing signs of improvement. I am going to perform some debridement in regard to the leg there is a small area which is having trouble getting completely clean my work on that a little bit more today. 09/04/2021 upon evaluation today patient appears to be doing pretty well in regard to his wounds. 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 Again he has I just found out today been off of the sodium thiosulfate for about 2 to 3 weeks. He tells me that the initial "3 weeks" was stated to be up by the nurse. With that being said I think he probably is going to need it for a bit longer to be honest. We discussed that today. Over  the penis area the Santyl does seem to be loose and a lot of this up and that is good hopefully will be able to get this completely cleared off it sometime shortly. 09/13/2021 upon evaluation today patient appears to be doing well with regard to his wound. He has been tolerating the dressing changes without complication. Fortunately there does not appear to be any signs of active infection locally or systemically which is great news. Overall I think that he is making progress with regard to his wounds in general. Grater, Alby L. ([id number]) 09/25/2021 upon evaluation today patient appears to be doing excellent in regard to his wounds. I am actually extremely pleased with where we stand today and I think he is making great progress. The patient likewise is happy to hear this. 10/09/2021 upon evaluation today patient actually appears to be doing excellent in regard to his wound. He has been tolerating the dressing changes without complication. Fortunately I do not see any evidence of active infection locally or systemically which is great news. No fevers, chills, nausea, vomiting, or diarrhea. Objective Constitutional Well-nourished and well-hydrated in no acute distress. Vitals Time Taken: 1:24 PM, Height: 68 in, Weight: 172 lbs, BMI: 26.1, Temperature: 98 F, Pulse: 73 bpm, Respiratory Rate: 18 breaths/min, Blood Pressure: 152/73 mmHg. Respiratory normal breathing without difficulty. Psychiatric this patient is able to make decisions and demonstrates good insight into disease process. Alert and Oriented x 3. pleasant and cooperative. General Notes: Upon inspection patient's wounds again both on the penis as well as his left leg are showing signs of excellent improvement. I am extremely pleased with where we stand and overall I think that the patient is making great progress. Integumentary (Hair, Skin) Wound #1 status is Open. Original cause of wound was Gradually Appeared. The date acquired  was: 04/05/2021. The wound has been in treatment 19 weeks. The wound is located on the Left,Posterior Lower Leg. The wound measures 20cm length x 14cm width x 0.2cm depth; 219.911cm^2 area and 43.982cm^3 volume. There is Fat Layer (Subcutaneous Tissue) exposed. There is no tunneling or undermining noted. There is a medium amount of serosanguineous drainage noted. There is medium (34-66%) red, pink granulation within the wound bed. There is a small (1-33%) amount of necrotic tissue within the wound bed including Eschar and Adherent Slough. Wound #2 status is Open. Original cause of wound was Gradually Appeared. The date acquired was: 05/05/2021. The wound has been in treatment 17 weeks. The wound is located on the Penis. The wound measures 0.4cm length x 0.4cm  width x 0.1cm depth; 0.126cm^2 area and 0.013cm^3 volume. There is Fat Layer (Subcutaneous Tissue) exposed. There is no tunneling or undermining noted. There is a medium amount of serosanguineous drainage noted. There is medium (34-66%) granulation within the wound bed. There is a medium (34-66%) amount of necrotic tissue within the wound bed including Eschar and Adherent Slough. 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 Edema Control - Lymphedema / Segmental Compressive Device / Other: Elevate, Exercise Daily and Avoid Standing for Long Periods of Time. Elevate legs to the level of the heart and pump ankles as often as possible Elevate leg(s) parallel to the floor when sitting. LOUI, [place] ([id number]) WOUND #1: - Lower Leg Wound  Laterality: Left, Posterior Cleanser: Byram Ancillary Kit - 15 Day Supply (Generic) 1 x Per Day/30 Days Discharge Instructions: Use supplies as instructed; Kit contains: (15) Saline Bullets; (15) 3x3 Gauze; 15 pr Gloves Primary Dressing: Kerlix Roll Sterile, 4.5x4.1 (in/yd) 1 x Per Day/30 Days Secondary Dressing: Kerlix 4.5 x 4.1 (in/yd) (Generic) 1 x Per Day/30 Days Discharge Instructions: Apply Kerlix 4.5 x 4.1 (in/yd) as instructed Secured With: Medipore Tape - 26M Medipore H Soft Cloth Surgical Tape, 2x2 (in/yd) (Generic) 1 x Per Day/30 Days WOUND #2: - Penis Wound Laterality: Cleanser: Byram Ancillary Kit - 15 Day Supply (Generic) 1 x Per Day/30 Days Discharge Instructions: Use supplies as instructed; Kit contains: (15) Saline Bullets; (15) 3x3 Gauze; 15 pr Gloves Primary Dressing: Gauze (Generic) 1 x Per Day/30 Days Discharge Instructions: moistened with santyl Primary Dressing: Santyl Collagenase Ointment, 30 (gm), tube 1 x Per Day/30 Days 1. I would recommend that we going continue with the wound care measures as before and the patient is in agreement with that plan. This includes the use of the Dakin's moistened gauze dressing to the legs which I think is doing a great job. 2. We will continue as well with the Santyl for the penis area which is also doing quite well. We will see patient back for reevaluation in 1 week here in the clinic. If anything worsens or changes patient will contact our office for additional recommendations. Electronic Signature(s) Signed: 10/09/2021 2:10:07 PM By: Worthy Keeler PA-C Entered By: Worthy Keeler on 10/09/2021 14:10:06 Mooneyham, Braedan L. ([id number]) -------------------------------------------------------------------------------- SuperBill Details Patient Name: [name] L. Date of Service: 10/09/2021 Medical Record Number: [id number] Patient Account Number: [id number] Date of Birth/Sex: July 10, 1941 (81 y.o. 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 with fat layer exposed Z99.2 Dependence on renal dialysis N18.4 Chronic kidney disease, stage 4 (severe) I25.10 Atherosclerotic heart disease of native coronary artery without angina pectoris Facility Procedures CPT4 Code: 84696295 Description: 99213 - WOUND CARE VISIT-LEV 3 EST PT Modifier: Quantity: 1 Physician Procedures CPT4 Code: 2841324 Description: 99214 - WC PHYS LEVEL 4 - EST PT Modifier: Quantity: 1 CPT4 Code: Description: ICD-10 Diagnosis Description E83.50  Unspecified disorder of calcium metabolism L97.822 Non-pressure chronic ulcer of other part of left lower leg with fat lay L98.492 Non-pressure chronic ulcer of skin of other sites with fat layer expose  Z99.2 Dependence on renal dialysis Modifier: er exposed d Quantity: Electronic Signature(s) Signed: 10/09/2021 2:10:26 PM By: Worthy Keeler PA-C Entered By: Worthy Keeler on 10/09/2021 14:10:25

## 2021-10-12 ENCOUNTER — Other Ambulatory Visit: Payer: Self-pay | Admitting: Family Medicine

## 2021-10-12 ENCOUNTER — Other Ambulatory Visit: Payer: Self-pay

## 2021-10-15 DIAGNOSIS — N186 End stage renal disease: Secondary | ICD-10-CM | POA: Diagnosis not present

## 2021-10-15 DIAGNOSIS — D689 Coagulation defect, unspecified: Secondary | ICD-10-CM | POA: Diagnosis not present

## 2021-10-15 DIAGNOSIS — N2581 Secondary hyperparathyroidism of renal origin: Secondary | ICD-10-CM | POA: Diagnosis not present

## 2021-10-15 DIAGNOSIS — E1122 Type 2 diabetes mellitus with diabetic chronic kidney disease: Secondary | ICD-10-CM | POA: Diagnosis not present

## 2021-10-15 DIAGNOSIS — Z992 Dependence on renal dialysis: Secondary | ICD-10-CM | POA: Diagnosis not present

## 2021-10-16 ENCOUNTER — Encounter (HOSPITAL_BASED_OUTPATIENT_CLINIC_OR_DEPARTMENT_OTHER): Payer: PPO | Admitting: Internal Medicine

## 2021-10-16 ENCOUNTER — Other Ambulatory Visit: Payer: Self-pay

## 2021-10-16 DIAGNOSIS — L97822 Non-pressure chronic ulcer of other part of left lower leg with fat layer exposed: Secondary | ICD-10-CM

## 2021-10-16 DIAGNOSIS — Z992 Dependence on renal dialysis: Secondary | ICD-10-CM | POA: Diagnosis not present

## 2021-10-16 DIAGNOSIS — L98492 Non-pressure chronic ulcer of skin of other sites with fat layer exposed: Secondary | ICD-10-CM

## 2021-10-22 DIAGNOSIS — D509 Iron deficiency anemia, unspecified: Secondary | ICD-10-CM | POA: Diagnosis not present

## 2021-10-22 DIAGNOSIS — D631 Anemia in chronic kidney disease: Secondary | ICD-10-CM | POA: Diagnosis not present

## 2021-10-22 DIAGNOSIS — D689 Coagulation defect, unspecified: Secondary | ICD-10-CM | POA: Diagnosis not present

## 2021-10-22 DIAGNOSIS — E876 Hypokalemia: Secondary | ICD-10-CM | POA: Diagnosis not present

## 2021-10-22 DIAGNOSIS — Z992 Dependence on renal dialysis: Secondary | ICD-10-CM | POA: Diagnosis not present

## 2021-10-22 DIAGNOSIS — N186 End stage renal disease: Secondary | ICD-10-CM | POA: Diagnosis not present

## 2021-10-22 DIAGNOSIS — N2581 Secondary hyperparathyroidism of renal origin: Secondary | ICD-10-CM | POA: Diagnosis not present

## 2021-10-23 ENCOUNTER — Other Ambulatory Visit: Payer: Self-pay

## 2021-10-23 ENCOUNTER — Encounter: Payer: PPO | Admitting: Physician Assistant

## 2021-10-23 DIAGNOSIS — L97222 Non-pressure chronic ulcer of left calf with fat layer exposed: Secondary | ICD-10-CM | POA: Diagnosis not present

## 2021-10-23 DIAGNOSIS — L98492 Non-pressure chronic ulcer of skin of other sites with fat layer exposed: Secondary | ICD-10-CM | POA: Diagnosis not present

## 2021-10-23 DIAGNOSIS — L97822 Non-pressure chronic ulcer of other part of left lower leg with fat layer exposed: Secondary | ICD-10-CM | POA: Diagnosis not present

## 2021-10-23 DIAGNOSIS — Z992 Dependence on renal dialysis: Secondary | ICD-10-CM | POA: Diagnosis not present

## 2021-10-23 NOTE — Progress Notes (Addendum)
TRA, WILEMON L. ([id number]) ?Visit Report for 10/23/2021 ?Arrival Information Details ?Patient Name: [name], [name]. ?Date of Service: 10/23/2021 1:15 PM ?Medical Record Number: [id number] ?Patient Account Number: [id number] ?Date of Birth/Sex: 1940/10/20 (81 y.o. M) ?Treating RN: Carlene Coria ?Primary Care Romanita Fager: Jenna Luo Other Clinician: ?Referring Karyss Frese: Jenna Luo ?Treating Quinisha Mould/Extender: Jeri Cos ?Weeks in Treatment: 21 ?Visit Information History Since Last Visit ?All ordered tests and consults were completed: No ?Patient Arrived: Ambulatory ?Added or deleted any medications: No ?Arrival Time: 13:15 ?Any new allergies or adverse reactions: No ?Accompanied By: wife ?Had a fall or experienced change in No ?Transfer Assistance: None ?activities of daily living that may affect ?Patient Identification Verified: Yes ?risk of falls: ?Secondary Verification Process Completed: Yes ?Signs or symptoms of abuse/neglect since last visito No ?Patient Requires Transmission-Based Precautions: No ?Hospitalized since last visit: No ?Patient Has Alerts: No ?Implantable device outside of the clinic excluding No ?cellular tissue based products placed in the center ?since last visit: ?Has Dressing in Place as Prescribed: Yes ?Pain Present Now: No ?Electronic Signature(s) ?Signed: 10/23/2021 4:35:36 PM By: Carlene Coria RN ?Entered By: Carlene Coria on 10/23/2021 13:20:24 ?[name], [name] ([id number]) ?-------------------------------------------------------------------------------- ?Clinic Level of Care Assessment Details ?Patient Name: [name], [name]. ?Date of Service: 10/23/2021 1:15 PM ?Medical Record Number: [id number] ?Patient Account Number: [id number] ?Date of Birth/Sex: 1940-08-29 (81 y.o. M) ?Treating RN: Carlene Coria ?Primary Care Norely Schlick: Jenna Luo Other Clinician: ?Referring Foch Rosenwald: Jenna Luo ?Treating Danique Hartsough/Extender: Jeri Cos ?Weeks in Treatment: 21 ?Clinic Level of Care  Assessment Items ?TOOL 4 Quantity Score ?X - Use when only an EandM is performed on FOLLOW-UP visit 1 0 ?ASSESSMENTS - Nursing Assessment / Reassessment ?X - Reassessment of Co-morbidities (includes updates in patient status) 1 10 ?X- 1 5 ?Reassessment of Adherence to Treatment Plan ?ASSESSMENTS - Wound and Skin Assessment / Reassessment ?[]  - Simple Wound Assessment / Reassessment - one wound 0 ?X- 2 5 ?Complex Wound Assessment / Reassessment - multiple wounds ?[]  - 0 ?Dermatologic / Skin Assessment (not related to wound area) ?ASSESSMENTS - Focused Assessment ?[]  - Circumferential Edema Measurements - multi extremities 0 ?[]  - 0 ?Nutritional Assessment / Counseling / Intervention ?[]  - 0 ?Lower Extremity Assessment (monofilament, tuning fork, pulses) ?[]  - 0 ?Peripheral Arterial Disease Assessment (using hand held doppler) ?ASSESSMENTS - Ostomy and/or Continence Assessment and Care ?[]  - Incontinence Assessment and Management 0 ?[]  - 0 ?Ostomy Care Assessment and Management (repouching, etc.) ?PROCESS - Coordination of Care ?X - Simple Patient / Family Education for ongoing care 1 15 ?[]  - 0 ?Complex (extensive) Patient / Family Education for ongoing care ?[]  - 0 ?Staff obtains Consents, Records, Test Results / Process Orders ?[]  - 0 ?Staff telephones HHA, Nursing Homes / Clarify orders / etc ?[]  - 0 ?Routine Transfer to another Facility (non-emergent condition) ?[]  - 0 ?Routine Hospital Admission (non-emergent condition) ?[]  - 0 ?New Admissions / Biomedical engineer / Ordering NPWT, Apligraf, etc. ?[]  - 0 ?Emergency Hospital Admission (emergent condition) ?X- 1 10 ?Simple Discharge Coordination ?[]  - 0 ?Complex (extensive) Discharge Coordination ?PROCESS - Special Needs ?[]  - Pediatric / Minor Patient Management 0 ?[]  - 0 ?Isolation Patient Management ?[]  - 0 ?Hearing / Language / Visual special needs ?[]  - 0 ?Assessment of Community assistance (transportation, D/C planning, etc.) ?[]  - 0 ?Additional  assistance / Altered mentation ?[]  - 0 ?Support Surface(s) Assessment (bed, cushion, seat, etc.) ?INTERVENTIONS - Wound Cleansing / Measurement ?[name], [name] ([id number]) ?[]  - 0 ?Simple Wound Cleansing - one  wound ?X- 2 5 ?Complex Wound Cleansing - multiple wounds ?X- 1 5 ?Wound Imaging (photographs - any number of wounds) ?[]  - 0 ?Wound Tracing (instead of photographs) ?[]  - 0 ?Simple Wound Measurement - one wound ?X- 2 5 ?Complex Wound Measurement - multiple wounds ?INTERVENTIONS - Wound Dressings ?X - Small Wound Dressing one or multiple wounds 1 10 ?[]  - 0 ?Medium Wound Dressing one or multiple wounds ?X- 1 20 ?Large Wound Dressing one or multiple wounds ?[]  - 0 ?Application of Medications - topical ?[]  - 0 ?Application of Medications - injection ?INTERVENTIONS - Miscellaneous ?[]  - External ear exam 0 ?[]  - 0 ?Specimen Collection (cultures, biopsies, blood, body fluids, etc.) ?[]  - 0 ?Specimen(s) / Culture(s) sent or taken to Lab for analysis ?[]  - 0 ?Patient Transfer (multiple staff / Civil Service fast streamer / Similar devices) ?[]  - 0 ?Simple Staple / Suture removal (25 or less) ?[]  - 0 ?Complex Staple / Suture removal (26 or more) ?[]  - 0 ?Hypo / Hyperglycemic Management (close monitor of Blood Glucose) ?[]  - 0 ?Ankle / Brachial Index (ABI) - do not check if billed separately ?X- 1 5 ?Vital Signs ?Has the patient been seen at the hospital within the last three years: Yes ?Total Score: 110 ?Level Of Care: New/Established - Level ?3 ?Electronic Signature(s) ?Signed: 10/23/2021 4:35:36 PM By: Carlene Coria RN ?Entered By: Carlene Coria on 10/23/2021 13:57:29 ?[name], [name] ([id number]) ?-------------------------------------------------------------------------------- ?Encounter Discharge Information Details ?Patient Name: [name], [name]. ?Date of Service: 10/23/2021 1:15 PM ?Medical Record Number: [id number] ?Patient Account Number: [id number] ?Date of Birth/Sex: 1941/07/19 (81 y.o. M) ?Treating RN: Carlene Coria ?Primary Care Cray Monnin: Jenna Luo Other Clinician: ?Referring Quoc Tome: Jenna Luo ?Treating Noela Brothers/Extender: Jeri Cos ?Weeks in Treatment: 21 ?Encounter Discharge Information Items ?Discharge Condition: Stable ?Ambulatory Status: [name] ?Discharge Destination: Home ?Transportation: Private Auto ?Accompanied By: wife ?Schedule Follow-up Appointment: Yes ?Clinical Summary of Care: Patient Declined ?Electronic Signature(s) ?Signed: 10/23/2021 4:35:36 PM By: Carlene Coria RN ?Entered By: Carlene Coria on 10/23/2021 14:03:32 ?[name], [name] ([id number]) ?-------------------------------------------------------------------------------- ?Lower Extremity Assessment Details ?Patient Name: [name], [name]. ?Date of Service: 10/23/2021 1:15 PM ?Medical Record Number: [id number] ?Patient Account Number: [id number] ?Date of Birth/Sex: 10-Apr-1941 (81 y.o. M) ?Treating RN: Carlene Coria ?Primary Care Bryndan Bilyk: Jenna Luo Other Clinician: ?Referring Johnatha Zeidman: Jenna Luo ?Treating Keighley Deckman/Extender: Jeri Cos ?Weeks in Treatment: 21 ?Edema Assessment ?Assessed: [Left: No] [Right: No] ?Edema: [Left: N] [Right: o] ?Calf ?Left: Right: ?Point of Measurement: 34 cm From Medial Instep 33 cm ?Ankle ?Left: Right: ?Point of Measurement: 10 cm From Medial Instep 20 cm ?Vascular Assessment ?Pulses: ?Dorsalis Pedis ?Palpable: [Left:Yes] ?Electronic Signature(s) ?Signed: 10/23/2021 4:35:36 PM By: Carlene Coria RN ?Entered By: Carlene Coria on 10/23/2021 13:31:53 ?[name], [name] ([id number]) ?-------------------------------------------------------------------------------- ?Multi Wound Chart Details ?Patient Name: [name], [name]. ?Date of Service: 10/23/2021 1:15 PM ?Medical Record Number: [id number] ?Patient Account Number: [id number] ?Date of Birth/Sex: 1940/08/30 (81 y.o. M) ?Treating RN: Carlene Coria ?Primary Care Tatum Corl: Jenna Luo Other Clinician: ?Referring Brettany Sydney: Jenna Luo ?Treating  Jonai Weyland/Extender: Jeri Cos ?Weeks in Treatment: 21 ?Vital Signs ?Height(in): 68 ?Pulse(bpm): 74 ?Weight(lbs): 172 ?Blood Pressure(mmHg): 146/72 ?Body Mass Index(BMI): 26.1 ?Temperature(??F): 98.2 ?Respiratory Rate(breaths/min): 18 ?

## 2021-10-23 NOTE — Progress Notes (Addendum)
ZEPHYR, RIDLEY L. ([id number]) ?Visit Report for 10/23/2021 ?Chief Complaint Document Details ?Patient Name: [name], [name]. ?Date of Service: 10/23/2021 1:15 PM ?Medical Record Number: [id number] ?Patient Account Number: [id number] ?Date of Birth/Sex: 06/28/1941 (81 y.o. 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 ([id number]) ?-------------------------------------------------------------------------------- ?HPI Details ?Patient Name: Mark, [name]. ?Date of Service: 10/23/2021 1:15 PM ?Medical Record Number: [id number] ?Patient Account Number: [id number] ?Date of Birth/Sex: 01-16-1941 (81 y.o. 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 This has been present for a couple of months currently he is on the sodium thiosulfate at ?dialysis. He tells me that they picked up on this quickly and have been managing it but he really has not had any help with anything else from the ?standpoint of progressing the wound itself. The patient does have again end-stage renal disease which currently is listed as stage IV but he is on ?renal dialysis. He also has coronary artery disease as well as going shortly for formal arterial studies although he did have a quick  test of the tibial ?artery with his vascular doctor when he went in to check on his fistula for his arm and subsequently they did not feel like he had any ?hemodynamically unstable flow into the lower extremity this is good news. Nonetheless I think that the biggest issue here is going to be getting ?some of the eschar off so that we get the wounds med headed in the appropriate direction. ?05/31/2021 upon evaluation today patient appears to be doing well with regard to his leg ulceration with calciphylaxis. I am very pleased in this ?regard. Fortunately there does not appear to be any signs of active infection at this time which is great news. No fevers, chills, nausea, vomiting, ?or diarrhea. ?06/07/2021 upon evaluation today patient appears to be doing well with regard to his legs. Unfortunately he is having an issue here with his penis ?as well he did mention at the end of last visit. I suggested that he go see urology. However when he went to see the urologist they basically told ?him there was not anything they could do. They recommended that we needed to be the ones to take care of this. With that being said this is an ?unusual area that to be honest a lot of the traditional wound care products we use are not really good to be good for this region. Potentially ?Medihoney alginate could be a possibility also think Santyl could be a possibility. With that being said I think initially my suggestion is probably ?can be for [place] to attempt the Santyl to see if that will benefit the patient. ?06/21/2021 upon evaluation today patient's wound bed actually showed signs of good granulation and epithelization at this point. Fortunately I do ?not see any signs of active infection systemically which is great news and his legs are doing awesome I think you are making great progress. The ?Santyl on  the glans penis also has been of benefit this is looking tremendously better. Again the patient is pleased he also tells me the  pain is ?significantly improved. Overall I think we are headed in the appropriate direction here. ?07/05/2021 upon evaluation today patient appears to be doing well with regard to his wounds. Both the area on the tip of his penis as well as the ?left lateral leg is doing well. There is some debridement this can be needed over the leg region. Fortunately I think that overall the Dakin's ?moistened gauze has done a great job here. No fevers, chills, nausea, vomiting, or diarrhea. ?07/12/2021 upon evaluation today patient presents for reevaluation here in the clinic and overall I think that his wounds are doing significantly ?better which is great news. This includes both the left leg as well as the glans of the penis. Both are showing signs of significant improvement ?which is great and overall I think that we are headed in the right direction. I do not see any evidence of active infection at this moment. ?07/19/2021 upon evaluation today patient appears to be doing better in regard to his wounds. Were getting much closer to complete resolution in ?regard to the necrotic tissue being removed from the leg. Overall I am extremely pleased with where we stand today. Overall I do not see any ?signs of active infection at this time. With regard to the patient's penis this area is showing signs of improvement as well and I am very pleased ?with that. ?07/26/2021 upon evaluation today patient appears to be doing well with regard to his wounds. The leg in fact is looking quite well and I am very ?pleased with where we stand today. I do not see any signs of infection which is great and there is a lot of new granulation growth also also. With ?regard to the penis area this also showed signs of excellent improvement which is great news. ?08/14/2020 upon evaluation today patient appears to be doing well with regard to his wounds both the leg as well as the glans penis region. Both ?are showing signs of improvement which is great news  and overall I am extremely pleased with where we stand at this point. I think that the ?Dakin's moistened gauze dressing for her leg is doing great and the Santyl is definitely helping with the glans penis location. ?08/28/2021 upon evaluation today patient appears to be doing well with regard to his wounds on the left leg as well as the penis. Fortunately both ?are showing signs of improvement. I am going to perform some debridement in regard to the leg there is a small area which is having trouble ?getting completely clean my work on that a little bit more today. ?09/04/2021 upon evaluation today patient appears to be doing pretty well in regard to his wounds. Still on the medial aspect of his left leg he has ?an area that seems to be wanting to try and spread as far as the Calciphylaxis is concerned. Again he has I just found out today been off of the ?sodium thiosulfate for about 2 to 3 weeks. He tells me that the initial "3 weeks" was stated to be up by the nurse. With that being said I think he ?probably is going to need it for a bit longer to be honest. We discussed that today. Over the penis area the Santyl does seem to be loose and a lot ?of this up and that is good hopefully will be able  to get this completely cleared off it sometime shortly. ?09/13/2021 upon evaluation today patient appears to be doing well with regard to his wound. He has been tolerating the dressing changes without ?complication. Fortunately there does not appear to be any signs of active infection locally or systemically which is great news. Overall I think that ?he is making progress with regard to his wounds in general. ?09/25/2021 upon evaluation today patient appears to be doing excellent in regard to his wounds. I am actually extremely pleased with where we ?stand today and I think he is making great progress. The patient likewise is happy to hear this. ?10/09/2021 upon evaluation today patient actually appears to be doing excellent in  regard to his wound. He has been tolerating the dressing ?changes without complication. Fortunately I do not see any evidence of active infection locally or systemically which is great news. No fevers, ?

## 2021-10-25 ENCOUNTER — Encounter: Payer: Self-pay | Admitting: Student in an Organized Health Care Education/Training Program

## 2021-10-28 DIAGNOSIS — N189 Chronic kidney disease, unspecified: Secondary | ICD-10-CM | POA: Diagnosis not present

## 2021-10-28 DIAGNOSIS — N186 End stage renal disease: Secondary | ICD-10-CM | POA: Diagnosis not present

## 2021-10-28 DIAGNOSIS — G9341 Metabolic encephalopathy: Secondary | ICD-10-CM | POA: Diagnosis not present

## 2021-10-28 DIAGNOSIS — A419 Sepsis, unspecified organism: Secondary | ICD-10-CM | POA: Diagnosis not present

## 2021-10-29 DIAGNOSIS — N2581 Secondary hyperparathyroidism of renal origin: Secondary | ICD-10-CM | POA: Diagnosis not present

## 2021-10-29 DIAGNOSIS — N186 End stage renal disease: Secondary | ICD-10-CM | POA: Diagnosis not present

## 2021-10-29 DIAGNOSIS — E876 Hypokalemia: Secondary | ICD-10-CM | POA: Diagnosis not present

## 2021-10-29 DIAGNOSIS — Z992 Dependence on renal dialysis: Secondary | ICD-10-CM | POA: Diagnosis not present

## 2021-10-29 DIAGNOSIS — D509 Iron deficiency anemia, unspecified: Secondary | ICD-10-CM | POA: Diagnosis not present

## 2021-10-29 DIAGNOSIS — D689 Coagulation defect, unspecified: Secondary | ICD-10-CM | POA: Diagnosis not present

## 2021-10-30 ENCOUNTER — Other Ambulatory Visit: Payer: Self-pay

## 2021-10-30 ENCOUNTER — Encounter: Payer: PPO | Admitting: Physician Assistant

## 2021-10-30 DIAGNOSIS — Z992 Dependence on renal dialysis: Secondary | ICD-10-CM | POA: Diagnosis not present

## 2021-10-30 DIAGNOSIS — L97222 Non-pressure chronic ulcer of left calf with fat layer exposed: Secondary | ICD-10-CM | POA: Diagnosis not present

## 2021-10-30 DIAGNOSIS — L97822 Non-pressure chronic ulcer of other part of left lower leg with fat layer exposed: Secondary | ICD-10-CM | POA: Diagnosis not present

## 2021-10-30 DIAGNOSIS — N184 Chronic kidney disease, stage 4 (severe): Secondary | ICD-10-CM | POA: Diagnosis not present

## 2021-10-30 DIAGNOSIS — L98492 Non-pressure chronic ulcer of skin of other sites with fat layer exposed: Secondary | ICD-10-CM | POA: Diagnosis not present

## 2021-10-30 NOTE — Progress Notes (Addendum)
JALONI, DAVOLI L. ([id number]) ?Visit Report for 10/30/2021 ?Chief Complaint Document Details ?Patient Name: [name], [name]. ?Date of Service: 10/30/2021 1:15 PM ?Medical Record Number: [id number] ?Patient Account Number: [id number] ?Date of Birth/Sex: 01/01/1941 (81 y.o. M) ?Treating RN: Carlene Coria ?Primary Care Provider: Jenna Luo Other Clinician: ?Referring Provider: Jenna Luo ?Treating Provider/Extender: Jeri Cos ?Weeks in Treatment: 22 ?Information Obtained from: Patient ?Chief Complaint ?Left LE Calciphylaxis and Glans Penis Calciphylaxis ?Electronic Signature(s) ?Signed: 10/30/2021 1:20:01 PM By: Worthy Keeler PA-C ?Entered By: Worthy Keeler on 10/30/2021 13:20:01 ?DAREK, EIFLER L. ([id number]) ?-------------------------------------------------------------------------------- ?HPI Details ?Patient Name: [name], [name]. ?Date of Service: 10/30/2021 1:15 PM ?Medical Record Number: [id number] ?Patient Account Number: [id number] ?Date of Birth/Sex: April 28, 1941 (81 y.o. 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 This has been present for a couple of months currently he is on the sodium thiosulfate at ?dialysis. He tells me that they picked up on this quickly and have been managing it but he really has not had any help with anything else from the ?standpoint of progressing the wound itself. The patient does have again end-stage renal disease which currently is listed as stage IV but he is on ?renal dialysis. He also has coronary artery disease as well as going shortly for formal arterial studies although he did have a quick  test of the tibial ?artery with his vascular doctor when he went in to check on his fistula for his arm and subsequently they did not feel like he had any ?hemodynamically unstable flow into the lower extremity this is good news. Nonetheless I think that the biggest issue here is going to be getting ?some of the eschar off so that we get the wounds med headed in the appropriate direction. ?05/31/2021 upon evaluation today patient appears to be doing well with regard to his leg ulceration with calciphylaxis. I am very pleased in this ?regard. Fortunately there does not appear to be any signs of active infection at this time which is great news. No fevers, chills, nausea, vomiting, ?or diarrhea. ?06/07/2021 upon evaluation today patient appears to be doing well with regard to his legs. Unfortunately he is having an issue here with his penis ?as well he did mention at the end of last visit. I suggested that he go see urology. However when he went to see the urologist they basically told ?him there was not anything they could do. They recommended that we needed to be the ones to take care of this. With that being said this is an ?unusual area that to be honest a lot of the traditional wound care products we use are not really good to be good for this region. Potentially ?Medihoney alginate could be a possibility also think Santyl could be a possibility. With that being said I think initially my suggestion is probably ?can be for [place] to attempt the Santyl to see if that will benefit the patient. ?06/21/2021 upon evaluation today patient's wound bed actually showed signs of good granulation and epithelization at this point. Fortunately I do ?not see any signs of active infection systemically which is great news and his legs are doing awesome I think you are making great progress. The ?Santyl on  the glans penis also has been of benefit this is looking tremendously better. Again the patient is pleased he also tells me the  pain is ?significantly improved. Overall I think we are headed in the appropriate direction here. ?07/05/2021 upon evaluation today patient appears to be doing well with regard to his wounds. Both the area on the tip of his penis as well as the ?left lateral leg is doing well. There is some debridement this can be needed over the leg region. Fortunately I think that overall the Dakin's ?moistened gauze has done a great job here. No fevers, chills, nausea, vomiting, or diarrhea. ?07/12/2021 upon evaluation today patient presents for reevaluation here in the clinic and overall I think that his wounds are doing significantly ?better which is great news. This includes both the left leg as well as the glans of the penis. Both are showing signs of significant improvement ?which is great and overall I think that we are headed in the right direction. I do not see any evidence of active infection at this moment. ?07/19/2021 upon evaluation today patient appears to be doing better in regard to his wounds. Were getting much closer to complete resolution in ?regard to the necrotic tissue being removed from the leg. Overall I am extremely pleased with where we stand today. Overall I do not see any ?signs of active infection at this time. With regard to the patient's penis this area is showing signs of improvement as well and I am very pleased ?with that. ?07/26/2021 upon evaluation today patient appears to be doing well with regard to his wounds. The leg in fact is looking quite well and I am very ?pleased with where we stand today. I do not see any signs of infection which is great and there is a lot of new granulation growth also also. With ?regard to the penis area this also showed signs of excellent improvement which is great news. ?08/14/2020 upon evaluation today patient appears to be doing well with regard to his wounds both the leg as well as the glans penis region. Both ?are showing signs of improvement which is great news  and overall I am extremely pleased with where we stand at this point. I think that the ?Dakin's moistened gauze dressing for her leg is doing great and the Santyl is definitely helping with the glans penis location. ?08/28/2021 upon evaluation today patient appears to be doing well with regard to his wounds on the left leg as well as the penis. Fortunately both ?are showing signs of improvement. I am going to perform some debridement in regard to the leg there is a small area which is having trouble ?getting completely clean my work on that a little bit more today. ?09/04/2021 upon evaluation today patient appears to be doing pretty well in regard to his wounds. Still on the medial aspect of his left leg he has ?an area that seems to be wanting to try and spread as far as the Calciphylaxis is concerned. Again he has I just found out today been off of the ?sodium thiosulfate for about 2 to 3 weeks. He tells me that the initial "3 weeks" was stated to be up by the nurse. With that being said I think he ?probably is going to need it for a bit longer to be honest. We discussed that today. Over the penis area the Santyl does seem to be loose and a lot ?of this up and that is good hopefully will be able  to get this completely cleared off it sometime shortly. ?09/13/2021 upon evaluation today patient appears to be doing well with regard to his wound. He has been tolerating the dressing changes without ?complication. Fortunately there does not appear to be any signs of active infection locally or systemically which is great news. Overall I think that ?he is making progress with regard to his wounds in general. ?09/25/2021 upon evaluation today patient appears to be doing excellent in regard to his wounds. I am actually extremely pleased with where we ?stand today and I think he is making great progress. The patient likewise is happy to hear this. ?10/09/2021 upon evaluation today patient actually appears to be doing excellent in  regard to his wound. He has been tolerating the dressing ?changes without complication. Fortunately I do not see any evidence of active infection locally or systemically which is great news. No fevers, ?

## 2021-10-30 NOTE — Progress Notes (Addendum)
ABDIRAHMAN, CHITTUM L. ([id number]) ?Visit Report for 10/30/2021 ?Arrival Information Details ?Patient Name: [name], [name]. ?Date of Service: 10/30/2021 1:15 PM ?Medical Record Number: [id number] ?Patient Account Number: [id number] ?Date of Birth/Sex: 04-18-1941 (81 y.o. M) ?Treating RN: Carlene Coria ?Primary Care Zeva Leber: Jenna Luo Other Clinician: ?Referring Konner Warrior: Jenna Luo ?Treating Ona Roehrs/Extender: Jeri Cos ?Weeks in Treatment: 22 ?Visit Information History Since Last Visit ?All ordered tests and consults were completed: No ?Patient Arrived: [name] ?Added or deleted any medications: No ?Arrival Time: 13:19 ?Any new allergies or adverse reactions: No ?Accompanied By: wife ?Had a fall or experienced change in No ?Transfer Assistance: None ?activities of daily living that may affect ?Patient Identification Verified: Yes ?risk of falls: ?Secondary Verification Process Completed: Yes ?Signs or symptoms of abuse/neglect since last visito No ?Patient Requires Transmission-Based Precautions: No ?Hospitalized since last visit: No ?Patient Has Alerts: No ?Implantable device outside of the clinic excluding No ?cellular tissue based products placed in the center ?since last visit: ?Has Dressing in Place as Prescribed: Yes ?Pain Present Now: No ?Electronic Signature(s) ?Signed: 10/30/2021 4:01:37 PM By: Carlene Coria RN ?Entered By: Carlene Coria on 10/30/2021 13:23:00 ?[name], [name] ([id number]) ?-------------------------------------------------------------------------------- ?Clinic Level of Care Assessment Details ?Patient Name: [name], [name]. ?Date of Service: 10/30/2021 1:15 PM ?Medical Record Number: [id number] ?Patient Account Number: [id number] ?Date of Birth/Sex: [birth year] (81 y.o. M) ?Treating RN: Carlene Coria ?Primary Care Thekla Colborn: Jenna Luo Other Clinician: ?Referring Latarshia Jersey: Jenna Luo ?Treating Stalin Gruenberg/Extender: Jeri Cos ?Weeks in Treatment: 22 ?Clinic Level of Care  Assessment Items ?TOOL 4 Quantity Score ?X - Use when only an EandM is performed on FOLLOW-UP visit 1 0 ?ASSESSMENTS - Nursing Assessment / Reassessment ?[]  - Reassessment of Co-morbidities (includes updates in patient status) 0 ?[]  - 0 ?Reassessment of Adherence to Treatment Plan ?ASSESSMENTS - Wound and Skin Assessment / Reassessment ?[]  - Simple Wound Assessment / Reassessment - one wound 0 ?X- 2 5 ?Complex Wound Assessment / Reassessment - multiple wounds ?[]  - 0 ?Dermatologic / Skin Assessment (not related to wound area) ?ASSESSMENTS - Focused Assessment ?[]  - Circumferential Edema Measurements - multi extremities 0 ?[]  - 0 ?Nutritional Assessment / Counseling / Intervention ?[]  - 0 ?Lower Extremity Assessment (monofilament, tuning fork, pulses) ?[]  - 0 ?Peripheral Arterial Disease Assessment (using hand held doppler) ?ASSESSMENTS - Ostomy and/or Continence Assessment and Care ?[]  - Incontinence Assessment and Management 0 ?[]  - 0 ?Ostomy Care Assessment and Management (repouching, etc.) ?PROCESS - Coordination of Care ?X - Simple Patient / Family Education for ongoing care 1 15 ?[]  - 0 ?Complex (extensive) Patient / Family Education for ongoing care ?[]  - 0 ?Staff obtains Consents, Records, Test Results / Process Orders ?[]  - 0 ?Staff telephones HHA, Nursing Homes / Clarify orders / etc ?[]  - 0 ?Routine Transfer to another Facility (non-emergent condition) ?[]  - 0 ?Routine Hospital Admission (non-emergent condition) ?[]  - 0 ?New Admissions / Biomedical engineer / Ordering NPWT, Apligraf, etc. ?[]  - 0 ?Emergency Hospital Admission (emergent condition) ?X- 1 10 ?Simple Discharge Coordination ?[]  - 0 ?Complex (extensive) Discharge Coordination ?PROCESS - Special Needs ?[]  - Pediatric / Minor Patient Management 0 ?[]  - 0 ?Isolation Patient Management ?[]  - 0 ?Hearing / Language / Visual special needs ?[]  - 0 ?Assessment of Community assistance (transportation, D/C planning, etc.) ?[]  - 0 ?Additional  assistance / Altered mentation ?[]  - 0 ?Support Surface(s) Assessment (bed, cushion, seat, etc.) ?INTERVENTIONS - Wound Cleansing / Measurement ?[name], [name] ([id number]) ?[]  - 0 ?Simple Wound Cleansing - one wound ?  X- 2 5 ?Complex Wound Cleansing - multiple wounds ?X- 1 5 ?Wound Imaging (photographs - any number of wounds) ?[]  - 0 ?Wound Tracing (instead of photographs) ?[]  - 0 ?Simple Wound Measurement - one wound ?X- 2 5 ?Complex Wound Measurement - multiple wounds ?INTERVENTIONS - Wound Dressings ?[]  - Small Wound Dressing one or multiple wounds 0 ?X- 1 15 ?Medium Wound Dressing one or multiple wounds ?[]  - 0 ?Large Wound Dressing one or multiple wounds ?[]  - 0 ?Application of Medications - topical ?[]  - 0 ?Application of Medications - injection ?INTERVENTIONS - Miscellaneous ?[]  - External ear exam 0 ?[]  - 0 ?Specimen Collection (cultures, biopsies, blood, body fluids, etc.) ?[]  - 0 ?Specimen(s) / Culture(s) sent or taken to Lab for analysis ?[]  - 0 ?Patient Transfer (multiple staff / Civil Service fast streamer / Similar devices) ?[]  - 0 ?Simple Staple / Suture removal (25 or less) ?[]  - 0 ?Complex Staple / Suture removal (26 or more) ?[]  - 0 ?Hypo / Hyperglycemic Management (close monitor of Blood Glucose) ?[]  - 0 ?Ankle / Brachial Index (ABI) - do not check if billed separately ?X- 1 5 ?Vital Signs ?Has the patient been seen at the hospital within the last three years: Yes ?Total Score: 80 ?Level Of Care: New/Established - Level ?3 ?Electronic Signature(s) ?Signed: 10/30/2021 4:01:37 PM By: Carlene Coria RN ?Entered By: Carlene Coria on 10/30/2021 13:56:34 ?[name], [name] ([id number]) ?-------------------------------------------------------------------------------- ?Encounter Discharge Information Details ?Patient Name: [name], [name]. ?Date of Service: 10/30/2021 1:15 PM ?Medical Record Number: [id number] ?Patient Account Number: [id number] ?Date of Birth/Sex: Jan 16, 1941 (81 y.o. M) ?Treating RN: Carlene Coria ?Primary  Care Josie Mesa: Jenna Luo Other Clinician: ?Referring Zabdi Mis: Jenna Luo ?Treating Erlinda Solinger/Extender: Jeri Cos ?Weeks in Treatment: 22 ?Encounter Discharge Information Items ?Discharge Condition: Stable ?Ambulatory Status: [name] ?Discharge Destination: Home ?Transportation: Private Auto ?Accompanied By: wife ?Schedule Follow-up Appointment: Yes ?Clinical Summary of Care: Patient Declined ?Electronic Signature(s) ?Signed: 10/30/2021 4:01:37 PM By: Carlene Coria RN ?Entered By: Carlene Coria on 10/30/2021 13:57:43 ?[name], [name] ([id number]) ?-------------------------------------------------------------------------------- ?Lower Extremity Assessment Details ?Patient Name: [name], [name]. ?Date of Service: 10/30/2021 1:15 PM ?Medical Record Number: [id number] ?Patient Account Number: [id number] ?Date of Birth/Sex: 1940/12/13 (81 y.o. M) ?Treating RN: Carlene Coria ?Primary Care Blaise Palladino: Jenna Luo Other Clinician: ?Referring Jhordan Kinter: Jenna Luo ?Treating Renea Schoonmaker/Extender: Jeri Cos ?Weeks in Treatment: 22 ?Edema Assessment ?Assessed: [Left: No] [Right: No] ?Edema: [Left: N] [Right: o] ?Calf ?Left: Right: ?Point of Measurement: 34 cm From Medial Instep 33 cm ?Ankle ?Left: Right: ?Point of Measurement: 10 cm From Medial Instep 20 cm ?Vascular Assessment ?Pulses: ?Dorsalis Pedis ?Palpable: [Left:Yes] ?Electronic Signature(s) ?Signed: 10/30/2021 4:01:37 PM By: Carlene Coria RN ?Entered By: Carlene Coria on 10/30/2021 13:33:54 ?[name], [name] ([id number]) ?-------------------------------------------------------------------------------- ?Multi Wound Chart Details ?Patient Name: [name], [name]. ?Date of Service: 10/30/2021 1:15 PM ?Medical Record Number: [id number] ?Patient Account Number: [id number] ?Date of Birth/Sex: [birth year] (81 y.o. M) ?Treating RN: Carlene Coria ?Primary Care Tanaiya Kolarik: Jenna Luo Other Clinician: ?Referring Leianne Callins: Jenna Luo ?Treating Fabianna Keats/Extender: Jeri Cos ?Weeks in Treatment: 22 ?Vital Signs ?Height(in): 68 ?Pulse(bpm): 66 ?Weight(lbs): 172 ?Blood Pressure(mmHg): 143/75 ?Body Mass Index(BMI): 26.1 ?Temperature(??F): 97.7 ?Respiratory Rate(breaths/min): 16 ?Photos: [

## 2021-11-02 DIAGNOSIS — Z992 Dependence on renal dialysis: Secondary | ICD-10-CM | POA: Diagnosis not present

## 2021-11-02 DIAGNOSIS — N186 End stage renal disease: Secondary | ICD-10-CM | POA: Diagnosis not present

## 2021-11-02 DIAGNOSIS — E1122 Type 2 diabetes mellitus with diabetic chronic kidney disease: Secondary | ICD-10-CM | POA: Diagnosis not present

## 2021-11-05 DIAGNOSIS — N2581 Secondary hyperparathyroidism of renal origin: Secondary | ICD-10-CM | POA: Diagnosis not present

## 2021-11-05 DIAGNOSIS — D689 Coagulation defect, unspecified: Secondary | ICD-10-CM | POA: Diagnosis not present

## 2021-11-05 DIAGNOSIS — N186 End stage renal disease: Secondary | ICD-10-CM | POA: Diagnosis not present

## 2021-11-05 DIAGNOSIS — Z992 Dependence on renal dialysis: Secondary | ICD-10-CM | POA: Diagnosis not present

## 2021-11-05 DIAGNOSIS — D509 Iron deficiency anemia, unspecified: Secondary | ICD-10-CM | POA: Diagnosis not present

## 2021-11-06 ENCOUNTER — Encounter: Payer: PPO | Attending: Physician Assistant | Admitting: Physician Assistant

## 2021-11-06 DIAGNOSIS — L97222 Non-pressure chronic ulcer of left calf with fat layer exposed: Secondary | ICD-10-CM | POA: Diagnosis not present

## 2021-11-06 DIAGNOSIS — N4889 Other specified disorders of penis: Secondary | ICD-10-CM | POA: Diagnosis not present

## 2021-11-06 DIAGNOSIS — N184 Chronic kidney disease, stage 4 (severe): Secondary | ICD-10-CM | POA: Diagnosis not present

## 2021-11-06 DIAGNOSIS — L98492 Non-pressure chronic ulcer of skin of other sites with fat layer exposed: Secondary | ICD-10-CM | POA: Insufficient documentation

## 2021-11-06 DIAGNOSIS — I2510 Atherosclerotic heart disease of native coronary artery without angina pectoris: Secondary | ICD-10-CM | POA: Insufficient documentation

## 2021-11-06 DIAGNOSIS — E1122 Type 2 diabetes mellitus with diabetic chronic kidney disease: Secondary | ICD-10-CM | POA: Insufficient documentation

## 2021-11-06 DIAGNOSIS — E1151 Type 2 diabetes mellitus with diabetic peripheral angiopathy without gangrene: Secondary | ICD-10-CM | POA: Insufficient documentation

## 2021-11-06 DIAGNOSIS — L97822 Non-pressure chronic ulcer of other part of left lower leg with fat layer exposed: Secondary | ICD-10-CM | POA: Insufficient documentation

## 2021-11-06 DIAGNOSIS — Z992 Dependence on renal dialysis: Secondary | ICD-10-CM | POA: Insufficient documentation

## 2021-11-06 NOTE — Progress Notes (Addendum)
THEOPOLIS, SLOOP L. ([id number]) ?Visit Report for 11/06/2021 ?Chief Complaint Document Details ?Patient Name: [name], [name]. ?Date of Service: 11/06/2021 1:15 PM ?Medical Record Number: [id number] ?Patient Account Number: [id number] ?Date of Birth/Sex: 1941-05-09 (81 y.o. M) ?Treating RN: Carlene Coria ?Primary Care Provider: Jenna Luo Other Clinician: ?Referring Provider: Jenna Luo ?Treating Provider/Extender: Jeri Cos ?Weeks in Treatment: 23 ?Information Obtained from: Patient ?Chief Complaint ?Left LE Calciphylaxis and Glans Penis Calciphylaxis ?Electronic Signature(s) ?Signed: 11/06/2021 1:26:34 PM By: Worthy Keeler PA-C ?Entered By: Worthy Keeler on 11/06/2021 13:26:34 ?Buth, [name] ([id number]) ?-------------------------------------------------------------------------------- ?HPI Details ?Patient Name: [name], [name]. ?Date of Service: 11/06/2021 1:15 PM ?Medical Record Number: [id number] ?Patient Account Number: [id number] ?Date of Birth/Sex: 04/05/1941 (81 y.o. 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 This has been present for a couple of months currently he is on the sodium thiosulfate at ?dialysis. He tells me that they picked up on this quickly and have been managing it but he really has not had any help with anything else from the ?standpoint of progressing the wound itself. The patient does have again end-stage renal disease which currently is listed as stage IV but he is on ?renal dialysis. He also has coronary artery disease as well as going shortly for formal arterial studies although he did have a quick test  of the tibial ?artery with his vascular doctor when he went in to check on his fistula for his arm and subsequently they did not feel like he had any ?hemodynamically unstable flow into the lower extremity this is good news. Nonetheless I think that the biggest issue here is going to be getting ?some of the eschar off so that we get the wounds med headed in the appropriate direction. ?05/31/2021 upon evaluation today patient appears to be doing well with regard to his leg ulceration with calciphylaxis. I am very pleased in this ?regard. Fortunately there does not appear to be any signs of active infection at this time which is great news. No fevers, chills, nausea, vomiting, ?or diarrhea. ?06/07/2021 upon evaluation today patient appears to be doing well with regard to his legs. Unfortunately he is having an issue here with his penis ?as well he did mention at the end of last visit. I suggested that he go see urology. However when he went to see the urologist they basically told ?him there was not anything they could do. They recommended that we needed to be the ones to take care of this. With that being said this is an ?unusual area that to be honest a lot of the traditional wound care products we use are not really good to be good for this region. Potentially ?Medihoney alginate could be a possibility also think Santyl could be a possibility. With that being said I think initially my suggestion is probably ?can be for [place] to attempt the Santyl to see if that will benefit the patient. ?06/21/2021 upon evaluation today patient's wound bed actually showed signs of good granulation and epithelization at this point. Fortunately I do ?not see any signs of active infection systemically which is great news and his legs are doing awesome I think you are making great progress. The ?Santyl on  the glans penis also has been of benefit this is looking tremendously better. Again the patient is pleased he also tells me the pain  is ?significantly improved. Overall I think we are headed in the appropriate direction here. ?07/05/2021 upon evaluation today patient appears to be doing well with regard to his wounds. Both the area on the tip of his penis as well as the ?left lateral leg is doing well. There is some debridement this can be needed over the leg region. Fortunately I think that overall the Dakin's ?moistened gauze has done a great job here. No fevers, chills, nausea, vomiting, or diarrhea. ?07/12/2021 upon evaluation today patient presents for reevaluation here in the clinic and overall I think that his wounds are doing significantly ?better which is great news. This includes both the left leg as well as the glans of the penis. Both are showing signs of significant improvement ?which is great and overall I think that we are headed in the right direction. I do not see any evidence of active infection at this moment. ?07/19/2021 upon evaluation today patient appears to be doing better in regard to his wounds. Were getting much closer to complete resolution in ?regard to the necrotic tissue being removed from the leg. Overall I am extremely pleased with where we stand today. Overall I do not see any ?signs of active infection at this time. With regard to the patient's penis this area is showing signs of improvement as well and I am very pleased ?with that. ?07/26/2021 upon evaluation today patient appears to be doing well with regard to his wounds. The leg in fact is looking quite well and I am very ?pleased with where we stand today. I do not see any signs of infection which is great and there is a lot of new granulation growth also also. With ?regard to the penis area this also showed signs of excellent improvement which is great news. ?08/14/2020 upon evaluation today patient appears to be doing well with regard to his wounds both the leg as well as the glans penis region. Both ?are showing signs of improvement which is great news and  overall I am extremely pleased with where we stand at this point. I think that the ?Dakin's moistened gauze dressing for her leg is doing great and the Santyl is definitely helping with the glans penis location. ?08/28/2021 upon evaluation today patient appears to be doing well with regard to his wounds on the left leg as well as the penis. Fortunately both ?are showing signs of improvement. I am going to perform some debridement in regard to the leg there is a small area which is having trouble ?getting completely clean my work on that a little bit more today. ?09/04/2021 upon evaluation today patient appears to be doing pretty well in regard to his wounds. Still on the medial aspect of his left leg he has ?an area that seems to be wanting to try and spread as far as the Calciphylaxis is concerned. Again he has I just found out today been off of the ?sodium thiosulfate for about 2 to 3 weeks. He tells me that the initial "3 weeks" was stated to be up by the nurse. With that being said I think he ?probably is going to need it for a bit longer to be honest. We discussed that today. Over the penis area the Santyl does seem to be loose and a lot ?of this up and that is good hopefully will be able  to get this completely cleared off it sometime shortly. ?09/13/2021 upon evaluation today patient appears to be doing well with regard to his wound. He has been tolerating the dressing changes without ?complication. Fortunately there does not appear to be any signs of active infection locally or systemically which is great news. Overall I think that ?he is making progress with regard to his wounds in general. ?09/25/2021 upon evaluation today patient appears to be doing excellent in regard to his wounds. I am actually extremely pleased with where we ?stand today and I think he is making great progress. The patient likewise is happy to hear this. ?10/09/2021 upon evaluation today patient actually appears to be doing excellent in  regard to his wound. He has been tolerating the dressing ?changes without complication. Fortunately I do not see any evidence of active infection locally or systemically which is great news. No fevers, ?TERR

## 2021-11-08 NOTE — Progress Notes (Signed)
[name], [name] ([id number]) ?Visit Report for 11/06/2021 ?Arrival Information Details ?Patient Name: [name], [name]. ?Date of Service: 11/06/2021 1:15 PM ?Medical Record Number: [id number] ?Patient Account Number: [id number] ?Date of Birth/Sex: [birth year] (81 y.o. M) ?Treating RN: Carlene Coria ?Primary Care Marella Vanderpol: Jenna Luo Other Clinician: ?Referring Iyonnah Ferrante: Jenna Luo ?Treating Zarrah Loveland/Extender: Jeri Cos ?Weeks in Treatment: 23 ?Visit Information History Since Last Visit ?All ordered tests and consults were completed: No ?Patient Arrived: [name] ?Added or deleted any medications: No ?Arrival Time: 13:11 ?Any new allergies or adverse reactions: No ?Accompanied By: wife ?Had a fall or experienced change in No ?Transfer Assistance: None ?activities of daily living that may affect ?Patient Identification Verified: Yes ?risk of falls: ?Secondary Verification Process Completed: Yes ?Signs or symptoms of abuse/neglect since last visito No ?Patient Requires Transmission-Based Precautions: No ?Hospitalized since last visit: No ?Patient Has Alerts: No ?Implantable device outside of the clinic excluding No ?cellular tissue based products placed in the center ?since last visit: ?Has Dressing in Place as Prescribed: Yes ?Pain Present Now: No ?Electronic Signature(s) ?Signed: 11/08/2021 9:23:23 AM By: Carlene Coria RN ?Entered By: Carlene Coria on 11/06/2021 13:14:48 ?[name], [name] ([id number]) ?-------------------------------------------------------------------------------- ?Clinic Level of Care Assessment Details ?Patient Name: [name], [name]. ?Date of Service: 11/06/2021 1:15 PM ?Medical Record Number: [id number] ?Patient Account Number: [id number] ?Date of Birth/Sex: 1940/08/06 (81 y.o. M) ?Treating RN: Carlene Coria ?Primary Care Mattalyn Anderegg: Jenna Luo Other Clinician: ?Referring Shterna Laramee: Jenna Luo ?Treating Kane Kusek/Extender: Jeri Cos ?Weeks in Treatment: 23 ?Clinic Level of Care Assessment  Items ?TOOL 4 Quantity Score ?X - Use when only an EandM is performed on FOLLOW-UP visit 1 0 ?ASSESSMENTS - Nursing Assessment / Reassessment ?X - Reassessment of Co-morbidities (includes updates in patient status) 1 10 ?X- 1 5 ?Reassessment of Adherence to Treatment Plan ?ASSESSMENTS - Wound and Skin Assessment / Reassessment ?X - Simple Wound Assessment / Reassessment - one wound 1 5 ?[]  - 0 ?Complex Wound Assessment / Reassessment - multiple wounds ?[]  - 0 ?Dermatologic / Skin Assessment (not related to wound area) ?ASSESSMENTS - Focused Assessment ?[]  - Circumferential Edema Measurements - multi extremities 0 ?[]  - 0 ?Nutritional Assessment / Counseling / Intervention ?[]  - 0 ?Lower Extremity Assessment (monofilament, tuning fork, pulses) ?[]  - 0 ?Peripheral Arterial Disease Assessment (using hand held doppler) ?ASSESSMENTS - Ostomy and/or Continence Assessment and Care ?[]  - Incontinence Assessment and Management 0 ?[]  - 0 ?Ostomy Care Assessment and Management (repouching, etc.) ?PROCESS - Coordination of Care ?X - Simple Patient / Family Education for ongoing care 1 15 ?[]  - 0 ?Complex (extensive) Patient / Family Education for ongoing care ?[]  - 0 ?Staff obtains Consents, Records, Test Results / Process Orders ?[]  - 0 ?Staff telephones HHA, Nursing Homes / Clarify orders / etc ?[]  - 0 ?Routine Transfer to another Facility (non-emergent condition) ?[]  - 0 ?Routine Hospital Admission (non-emergent condition) ?[]  - 0 ?New Admissions / Biomedical engineer / Ordering NPWT, Apligraf, etc. ?[]  - 0 ?Emergency Hospital Admission (emergent condition) ?X- 1 10 ?Simple Discharge Coordination ?[]  - 0 ?Complex (extensive) Discharge Coordination ?PROCESS - Special Needs ?[]  - Pediatric / Minor Patient Management 0 ?[]  - 0 ?Isolation Patient Management ?[]  - 0 ?Hearing / Language / Visual special needs ?[]  - 0 ?Assessment of Community assistance (transportation, D/C planning, etc.) ?[]  - 0 ?Additional assistance /  Altered mentation ?[]  - 0 ?Support Surface(s) Assessment (bed, cushion, seat, etc.) ?INTERVENTIONS - Wound Cleansing / Measurement ?[name], [name] ([id number]) ?X- 1 5 ?Simple Wound Cleansing -  one wound ?[]  - 0 ?Complex Wound Cleansing - multiple wounds ?X- 1 5 ?Wound Imaging (photographs - any number of wounds) ?[]  - 0 ?Wound Tracing (instead of photographs) ?X- 1 5 ?Simple Wound Measurement - one wound ?[]  - 0 ?Complex Wound Measurement - multiple wounds ?INTERVENTIONS - Wound Dressings ?[]  - Small Wound Dressing one or multiple wounds 0 ?[]  - 0 ?Medium Wound Dressing one or multiple wounds ?X- 1 20 ?Large Wound Dressing one or multiple wounds ?[]  - 0 ?Application of Medications - topical ?[]  - 0 ?Application of Medications - injection ?INTERVENTIONS - Miscellaneous ?[]  - External ear exam 0 ?[]  - 0 ?Specimen Collection (cultures, biopsies, blood, body fluids, etc.) ?[]  - 0 ?Specimen(s) / Culture(s) sent or taken to Lab for analysis ?[]  - 0 ?Patient Transfer (multiple staff / Civil Service fast streamer / Similar devices) ?[]  - 0 ?Simple Staple / Suture removal (25 or less) ?[]  - 0 ?Complex Staple / Suture removal (26 or more) ?[]  - 0 ?Hypo / Hyperglycemic Management (close monitor of Blood Glucose) ?[]  - 0 ?Ankle / Brachial Index (ABI) - do not check if billed separately ?X- 1 5 ?Vital Signs ?Has the patient been seen at the hospital within the last three years: Yes ?Total Score: 85 ?Level Of Care: New/Established - Level ?3 ?Electronic Signature(s) ?Signed: 11/08/2021 9:23:23 AM By: Carlene Coria RN ?Entered By: Carlene Coria on 11/06/2021 13:37:25 ?[name], [name] ([id number]) ?-------------------------------------------------------------------------------- ?Encounter Discharge Information Details ?Patient Name: [name], [name]. ?Date of Service: 11/06/2021 1:15 PM ?Medical Record Number: [id number] ?Patient Account Number: [id number] ?Date of Birth/Sex: 04-10-1941 (81 y.o. M) ?Treating RN: Carlene Coria ?Primary Care Corrin Hingle:  Jenna Luo Other Clinician: ?Referring Amarisa Wilinski: Jenna Luo ?Treating Lulamae Skorupski/Extender: Jeri Cos ?Weeks in Treatment: 23 ?Encounter Discharge Information Items ?Discharge Condition: Stable ?Ambulatory Status: [name] ?Discharge Destination: Home ?Transportation: Private Auto ?Accompanied By: wife ?Schedule Follow-up Appointment: Yes ?Clinical Summary of Care: Patient Declined ?Electronic Signature(s) ?Signed: 11/08/2021 9:23:23 AM By: Carlene Coria RN ?Entered By: Carlene Coria on 11/06/2021 13:38:38 ?[name], [name] ([id number]) ?-------------------------------------------------------------------------------- ?Lower Extremity Assessment Details ?Patient Name: [name], [name]. ?Date of Service: 11/06/2021 1:15 PM ?Medical Record Number: [id number] ?Patient Account Number: [id number] ?Date of Birth/Sex: 1941/06/24 (81 y.o. M) ?Treating RN: Carlene Coria ?Primary Care Meela Wareing: Jenna Luo Other Clinician: ?Referring Payal Stanforth: Jenna Luo ?Treating Madysin Crisp/Extender: Jeri Cos ?Weeks in Treatment: 23 ?Edema Assessment ?Assessed: [Left: No] [Right: No] ?Edema: [Left: Ye] [Right: s] ?Calf ?Left: Right: ?Point of Measurement: 34 cm From Medial Instep 33 cm ?Ankle ?Left: Right: ?Point of Measurement: 10 cm From Medial Instep 20 cm ?Vascular Assessment ?Pulses: ?Dorsalis Pedis ?Palpable: [Left:Yes] ?Electronic Signature(s) ?Signed: 11/08/2021 9:23:23 AM By: Carlene Coria RN ?Entered By: Carlene Coria on 11/06/2021 13:25:29 ?[name], [name] ([id number]) ?-------------------------------------------------------------------------------- ?Multi Wound Chart Details ?Patient Name: [name], [name]. ?Date of Service: 11/06/2021 1:15 PM ?Medical Record Number: [id number] ?Patient Account Number: [id number] ?Date of Birth/Sex: 14-Sep-1940 (81 y.o. M) ?Treating RN: Carlene Coria ?Primary Care Wafaa Deemer: Jenna Luo Other Clinician: ?Referring Ola Fawver: Jenna Luo ?Treating Christan Ciccarelli/Extender: Jeri Cos ?Weeks in  Treatment: 23 ?Vital Signs ?Height(in): 68 ?Pulse(bpm): 75 ?Weight(lbs): 172 ?Blood Pressure(mmHg): 169/74 ?Body Mass Index(BMI): 26.1 ?Temperature(??F): 97.8 ?Respiratory Rate(breaths/min): 20 ?Photos: [N/A:N/

## 2021-11-12 DIAGNOSIS — D689 Coagulation defect, unspecified: Secondary | ICD-10-CM | POA: Diagnosis not present

## 2021-11-12 DIAGNOSIS — N2581 Secondary hyperparathyroidism of renal origin: Secondary | ICD-10-CM | POA: Diagnosis not present

## 2021-11-12 DIAGNOSIS — N186 End stage renal disease: Secondary | ICD-10-CM | POA: Diagnosis not present

## 2021-11-12 DIAGNOSIS — D631 Anemia in chronic kidney disease: Secondary | ICD-10-CM | POA: Diagnosis not present

## 2021-11-12 DIAGNOSIS — E876 Hypokalemia: Secondary | ICD-10-CM | POA: Diagnosis not present

## 2021-11-12 DIAGNOSIS — D509 Iron deficiency anemia, unspecified: Secondary | ICD-10-CM | POA: Diagnosis not present

## 2021-11-12 DIAGNOSIS — Z992 Dependence on renal dialysis: Secondary | ICD-10-CM | POA: Diagnosis not present

## 2021-11-13 ENCOUNTER — Encounter: Payer: PPO | Admitting: Physician Assistant

## 2021-11-13 DIAGNOSIS — N4889 Other specified disorders of penis: Secondary | ICD-10-CM | POA: Diagnosis not present

## 2021-11-13 DIAGNOSIS — L97822 Non-pressure chronic ulcer of other part of left lower leg with fat layer exposed: Secondary | ICD-10-CM | POA: Diagnosis not present

## 2021-11-13 DIAGNOSIS — L98492 Non-pressure chronic ulcer of skin of other sites with fat layer exposed: Secondary | ICD-10-CM | POA: Diagnosis not present

## 2021-11-13 DIAGNOSIS — L97222 Non-pressure chronic ulcer of left calf with fat layer exposed: Secondary | ICD-10-CM | POA: Diagnosis not present

## 2021-11-13 NOTE — Progress Notes (Signed)
MALIK, PAAR L. ([id number]) ?Visit Report for 11/13/2021 ?Chief Complaint Document Details ?Patient Name: [name], [name]. ?Date of Service: 11/13/2021 1:15 PM ?Medical Record Number: [id number] ?Patient Account Number: [id number] ?Date of Birth/Sex: 10/26/1940 (81 y.o. M) ?Treating RN: Carlene Coria ?Primary Care Provider: Jenna Luo Other Clinician: ?Referring Provider: Jenna Luo ?Treating Provider/Extender: Jeri Cos ?Weeks in Treatment: 24 ?Information Obtained from: Patient ?Chief Complaint ?Left LE Calciphylaxis and Glans Penis Calciphylaxis ?Electronic Signature(s) ?Signed: 11/13/2021 1:53:01 PM By: Worthy Keeler PA-C ?Entered By: Worthy Keeler on 11/13/2021 13:53:01 ?DAYVION, SANS L. ([id number]) ?-------------------------------------------------------------------------------- ?HPI Details ?Patient Name: [name], [name]. ?Date of Service: 11/13/2021 1:15 PM ?Medical Record Number: [id number] ?Patient Account Number: [id number] ?Date of Birth/Sex: 08-Jan-1941 (81 y.o. 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 This has been present for a couple of months currently he is on the sodium thiosulfate at ?dialysis. He tells me that they picked up on this quickly and have been managing it but he really has not had any help with anything else from the ?standpoint of progressing the wound itself. The patient does have again end-stage renal disease which currently is listed as stage IV but he is on ?renal dialysis. He also has coronary artery disease as well as going shortly for formal arterial studies although he did have a quick  test of the tibial ?artery with his vascular doctor when he went in to check on his fistula for his arm and subsequently they did not feel like he had any ?hemodynamically unstable flow into the lower extremity this is good news. Nonetheless I think that the biggest issue here is going to be getting ?some of the eschar off so that we get the wounds med headed in the appropriate direction. ?05/31/2021 upon evaluation today patient appears to be doing well with regard to his leg ulceration with calciphylaxis. I am very pleased in this ?regard. Fortunately there does not appear to be any signs of active infection at this time which is great news. No fevers, chills, nausea, vomiting, ?or diarrhea. ?06/07/2021 upon evaluation today patient appears to be doing well with regard to his legs. Unfortunately he is having an issue here with his penis ?as well he did mention at the end of last visit. I suggested that he go see urology. However when he went to see the urologist they basically told ?him there was not anything they could do. They recommended that we needed to be the ones to take care of this. With that being said this is an ?unusual area that to be honest a lot of the traditional wound care products we use are not really good to be good for this region. Potentially ?Medihoney alginate could be a possibility also think Santyl could be a possibility. With that being said I think initially my suggestion is probably ?can be for [place] to attempt the Santyl to see if that will benefit the patient. ?06/21/2021 upon evaluation today patient's wound bed actually showed signs of good granulation and epithelization at this point. Fortunately I do ?not see any signs of active infection systemically which is great news and his legs are doing awesome I think you are making great progress. The ?Santyl on  the glans penis also has been of benefit this is looking tremendously better. Again the patient is pleased he also tells me the  pain is ?significantly improved. Overall I think we are headed in the appropriate direction here. ?07/05/2021 upon evaluation today patient appears to be doing well with regard to his wounds. Both the area on the tip of his penis as well as the ?left lateral leg is doing well. There is some debridement this can be needed over the leg region. Fortunately I think that overall the Dakin's ?moistened gauze has done a great job here. No fevers, chills, nausea, vomiting, or diarrhea. ?07/12/2021 upon evaluation today patient presents for reevaluation here in the clinic and overall I think that his wounds are doing significantly ?better which is great news. This includes both the left leg as well as the glans of the penis. Both are showing signs of significant improvement ?which is great and overall I think that we are headed in the right direction. I do not see any evidence of active infection at this moment. ?07/19/2021 upon evaluation today patient appears to be doing better in regard to his wounds. Were getting much closer to complete resolution in ?regard to the necrotic tissue being removed from the leg. Overall I am extremely pleased with where we stand today. Overall I do not see any ?signs of active infection at this time. With regard to the patient's penis this area is showing signs of improvement as well and I am very pleased ?with that. ?07/26/2021 upon evaluation today patient appears to be doing well with regard to his wounds. The leg in fact is looking quite well and I am very ?pleased with where we stand today. I do not see any signs of infection which is great and there is a lot of new granulation growth also also. With ?regard to the penis area this also showed signs of excellent improvement which is great news. ?08/14/2020 upon evaluation today patient appears to be doing well with regard to his wounds both the leg as well as the glans penis region. Both ?are showing signs of improvement which is great news  and overall I am extremely pleased with where we stand at this point. I think that the ?Dakin's moistened gauze dressing for her leg is doing great and the Santyl is definitely helping with the glans penis location. ?08/28/2021 upon evaluation today patient appears to be doing well with regard to his wounds on the left leg as well as the penis. Fortunately both ?are showing signs of improvement. I am going to perform some debridement in regard to the leg there is a small area which is having trouble ?getting completely clean my work on that a little bit more today. ?09/04/2021 upon evaluation today patient appears to be doing pretty well in regard to his wounds. Still on the medial aspect of his left leg he has ?an area that seems to be wanting to try and spread as far as the Calciphylaxis is concerned. Again he has I just found out today been off of the ?sodium thiosulfate for about 2 to 3 weeks. He tells me that the initial "3 weeks" was stated to be up by the nurse. With that being said I think he ?probably is going to need it for a bit longer to be honest. We discussed that today. Over the penis area the Santyl does seem to be loose and a lot ?of this up and that is good hopefully will be able  to get this completely cleared off it sometime shortly. ?09/13/2021 upon evaluation today patient appears to be doing well with regard to his wound. He has been tolerating the dressing changes without ?complication. Fortunately there does not appear to be any signs of active infection locally or systemically which is great news. Overall I think that ?he is making progress with regard to his wounds in general. ?09/25/2021 upon evaluation today patient appears to be doing excellent in regard to his wounds. I am actually extremely pleased with where we ?stand today and I think he is making great progress. The patient likewise is happy to hear this. ?10/09/2021 upon evaluation today patient actually appears to be doing excellent in  regard to his wound. He has been tolerating the dressing ?changes without complication. Fortunately I do not see any evidence of active infection locally or systemically which is great news. No fevers, ?

## 2021-11-15 ENCOUNTER — Ambulatory Visit
Payer: PPO | Attending: Student in an Organized Health Care Education/Training Program | Admitting: Student in an Organized Health Care Education/Training Program

## 2021-11-15 ENCOUNTER — Encounter: Payer: Self-pay | Admitting: Student in an Organized Health Care Education/Training Program

## 2021-11-15 VITALS — BP 111/63 | HR 65 | Temp 97.2°F | Resp 16 | Ht 68.0 in | Wt 167.0 lb

## 2021-11-15 DIAGNOSIS — E1142 Type 2 diabetes mellitus with diabetic polyneuropathy: Secondary | ICD-10-CM | POA: Diagnosis not present

## 2021-11-15 DIAGNOSIS — G894 Chronic pain syndrome: Secondary | ICD-10-CM | POA: Diagnosis not present

## 2021-11-15 DIAGNOSIS — Z794 Long term (current) use of insulin: Secondary | ICD-10-CM | POA: Diagnosis not present

## 2021-11-15 DIAGNOSIS — N186 End stage renal disease: Secondary | ICD-10-CM | POA: Diagnosis not present

## 2021-11-15 DIAGNOSIS — L97923 Non-pressure chronic ulcer of unspecified part of left lower leg with necrosis of muscle: Secondary | ICD-10-CM | POA: Insufficient documentation

## 2021-11-15 DIAGNOSIS — Z79891 Long term (current) use of opiate analgesic: Secondary | ICD-10-CM | POA: Diagnosis not present

## 2021-11-15 DIAGNOSIS — Z0289 Encounter for other administrative examinations: Secondary | ICD-10-CM | POA: Diagnosis not present

## 2021-11-15 MED ORDER — HYDROCODONE-ACETAMINOPHEN 10-325 MG PO TABS: 1.0000 | ORAL_TABLET | Freq: Four times a day (QID) | ORAL | 0 refills | Status: AC | PRN

## 2021-11-15 MED ORDER — HYDROCODONE-ACETAMINOPHEN 10-325 MG PO TABS: 1.0000 | ORAL_TABLET | Freq: Four times a day (QID) | ORAL | 0 refills | Status: DC | PRN

## 2021-11-15 NOTE — Progress Notes (Signed)
PROVIDER NOTE: Information contained herein reflects review and annotations entered in association with encounter. Interpretation of such information and data should be left to medically-trained personnel. Information provided to patient can be located elsewhere in the medical record under "Patient Instructions". Document created using STT-dictation technology, any transcriptional errors that may result from process are unintentional.  ?  ?Patient: Mark Jumbo Sr.  Service Category: E/M  Provider: [name], MD  ?DOB: August 20, 1940  DOS: 11/15/2021  Specialty: Interventional Pain Management  ?MRN: [id number]  Setting: Ambulatory outpatient  PCP: [name], MD  ?Type: Established Patient    Referring Provider: [name], MD  ?Location: Office  Delivery: Face-to-face    ? ?HPI  ?Mr. Mark Linde Gillis Sr., a 81 y.o. year old male, is here today because of his Chronic pain syndrome [G89.4]. [name] primary complain today is Leg Pain (Left) ?Last encounter: My last encounter with him was on 09/18/2021. ?Pertinent problems: [name] has End stage renal disease ([place]); Pain management contract signed; Calciphylaxis of left lower extremity with nonhealing ulcer with necrosis of muscle ([place]); and Chronic pain syndrome on their pertinent problem list. ?Pain Assessment: Severity of Chronic pain is reported as a 5 /10. Location: Leg Left/pain goes up and down left leg; from hip to ankle; HIP JOINT pain is MUCH BETTER than it had been. Onset: More than a month ago. Quality: Throbbing, Sharp, Aching. Timing: Intermittent. Modifying factor(s): elevates leg when sleeping; goes to wound clinic weekly for left leg lesion. ?Vitals:  height is 5' 8"  (1.727 m) and weight is 167 lb (75.8 kg). His temporal temperature is 97.2 ?F (36.2 ?C) (abnormal). His blood pressure is 111/63 and his pulse is 65. His respiration is 16 and oxygen saturation is 100%.  ? ?Reason for encounter: medication management.   ? ?[name]  presents today for medication management.  He is endorsing significant analgesic benefit on his current regimen of hydrocodone 10 mg every 6 hours as needed.  He continues to work with wound care for his left leg.  He states that he is more functional and able to complete ADLs more comfortably. ? ?Pharmacotherapy Assessment  ?Analgesic: Hydrocodone 10 mg every 6 hours as needed, #120/month MME= 40.}  ? ?Monitoring: ?[place] PMP: PDMP reviewed during this encounter.       ?Pharmacotherapy: No side-effects or adverse reactions reported. ?Compliance: No problems identified. ?Effectiveness: Clinically acceptable. ? ?Rise Patience, RN  11/15/2021 12:52 PM  Sign when Signing Visit ?Nursing Pain Medication Assessment:  ?Safety precautions to be maintained throughout the outpatient stay will include: orient to surroundings, keep bed in low position, maintain call bell within reach at all times, provide assistance with transfer out of bed and ambulation.  ?Medication Inspection Compliance: Pill count conducted under aseptic conditions, in front of the patient. Neither the pills nor the bottle was removed from the patient's sight at any time. Once count was completed pills were immediately returned to the patient in their original bottle. ? ?Medication: Hydrocodone/APAP ?Pill/Patch Count:  85 of 120 pills remain ?Pill/Patch Appearance: Markings consistent with prescribed medication ?Bottle Appearance: Standard pharmacy container. Clearly labeled. ?Filled Date: 04 / 04 / 2023 ?Last Medication intake:  Today ?  ?  UDS:  ?Summary  ?Date Value Ref Range Status  ?09/06/2021 Note  Final  ?  Comment:  ?  ==================================================================== ?[facility] Select 13 (MW) ?==================================================================== ?Test  Result       Flag       Units ? ?Drug Present and Declared for Prescription Verification ?  Hydrocodone                    >60737        EXPECTED   ng/mg creat ?  Hydromorphone                  1019         EXPECTED   ng/mg creat ?  Dihydrocodeine                 1089         EXPECTED   ng/mg creat ?  Norhydrocodone                 7886         EXPECTED   ng/mg creat ?   Sources of hydrocodone include scheduled prescription medications. ?   Hydromorphone, dihydrocodeine and norhydrocodone are expected ?   metabolites of hydrocodone. Hydromorphone and dihydrocodeine are ?   also available as scheduled prescription medications. ? ?Drug Present not Declared for Prescription Verification ?  Desmethyldiazepam              40           UNEXPECTED ng/mg creat ?  Oxazepam                       194          UNEXPECTED ng/mg creat ?   Desmethyldiazepam and oxazepam are benzodiazepine drugs, but may ?   also be present as common metabolites of other benzodiazepine drugs, ?   including diazepam, chlordiazepoxide, prazepam, clorazepate, and ?   halazepam. ? ?==================================================================== ?Test                      Result    Flag   Units      Ref Range ?  Creatinine              63               mg/dL      >=20 ?==================================================================== ?Declared Medications: ? The flagging and interpretation on this report are based on the ? following declared medications.  Unexpected results may arise from ? inaccuracies in the declared medications. ? ? **Note: The testing scope of this panel includes these medications: ? ? Hydrocodone ([place]co) ? ? **Note: The testing scope of this panel does not include the ? following reported medications: ? ? Acetaminophen (Norco) ? Aspirin ? Calcitriol ? Diltiazem ? Insulin (NovoLog) ? Metoprolol ? Pancrelipase ? Pravastatin ? Sevelamer (Renvela) ? Sodium Bicarbonate ? Topical ?==================================================================== ?For clinical consultation, please call [phone]  [phone]. ?==================================================================== ?  ?  ? ?ROS  ?Constitutional: Denies any fever or chills ?Gastrointestinal: No reported hemesis, hematochezia, vomiting, or acute GI distress ?Musculoskeletal:  Left leg pain ?Neurological: No reported episodes of acute onset apraxia, aphasia, dysarthria, agnosia, amnesia, paralysis, loss of coordination, or loss of consciousness ? ?Medication Review  ?DIALYVITE 800 WITH ZINC, HYDROcodone-acetaminophen, aspirin EC, cinacalcet, diltiazem, insulin aspart, insulin glargine, lidocaine-prilocaine, lipase/protease/amylase, metoprolol tartrate, pravastatin, and sevelamer carbonate ? ?History Review  ?Allergy: Mr. Bufkin is allergic to enalapril maleate. ?Drug: Mr. Labarge  reports no history of drug use. ?Alcohol:  reports no history of alcohol use. ?Tobacco:  reports that he has never smoked. He has  never used smokeless tobacco. ?Social: Mr. Cowley  reports that he has never smoked. He has never used smokeless tobacco. He reports that he does not drink alcohol and does not use drugs. ?Medical:  has a past medical history of Arthritis, Chronic back pain, Chronic kidney disease, Coronary atherosclerosis of native coronary artery, Deafness in right ear, Diabetes mellitus, Diabetic retinopathy ([place]), Diastolic dysfunction (12/3297), Essential hypertension, benign, Heart murmur, HOH (hard of hearing), Hyperkalemia, Kidney stones, Mixed hyperlipidemia, NAFLD (nonalcoholic fatty liver disease), Pancreatic insufficiency, Prostate cancer ([place]) (2011), Shortness of breath dyspnea, Subdural hematoma ([place]), Type 2 diabetes mellitus ([place]), and Wears hearing aid. ?Surgical: Mr. Wainright  has a past surgical history that includes Prostatectomy (2011); Hernia repair; Appendectomy; Cholecystectomy; Bilateral hip replacement; Cataract extraction w/PHACO (Left, 05/15/2015); Cataract extraction w/PHACO (Right, 08/28/2015); Insertion of dialysis catheter (Right,  01/22/2021); Insertion of dialysis catheter (Right, 04/16/2021); and AV fistula placement (Left, 05/15/2021). ?Family: family history includes Diabetes type II in his mother; Heart attack in his father. ? ?Laboratory Chemistry Profile  ? ?Renal ?Lab Results  ?Component Value Date

## 2021-11-15 NOTE — Progress Notes (Signed)
Nursing Pain Medication Assessment:  ?Safety precautions to be maintained throughout the outpatient stay will include: orient to surroundings, keep bed in low position, maintain call bell within reach at all times, provide assistance with transfer out of bed and ambulation.  ?Medication Inspection Compliance: Pill count conducted under aseptic conditions, in front of the patient. Neither the pills nor the bottle was removed from the patient's sight at any time. Once count was completed pills were immediately returned to the patient in their original bottle. ? ?Medication: Hydrocodone/APAP ?Pill/Patch Count:  85 of 120 pills remain ?Pill/Patch Appearance: Markings consistent with prescribed medication ?Bottle Appearance: Standard pharmacy container. Clearly labeled. ?Filled Date: 04 / 04 / 2023 ?Last Medication intake:  Today ?

## 2021-11-19 DIAGNOSIS — D509 Iron deficiency anemia, unspecified: Secondary | ICD-10-CM | POA: Diagnosis not present

## 2021-11-19 DIAGNOSIS — E1122 Type 2 diabetes mellitus with diabetic chronic kidney disease: Secondary | ICD-10-CM | POA: Diagnosis not present

## 2021-11-19 DIAGNOSIS — D689 Coagulation defect, unspecified: Secondary | ICD-10-CM | POA: Diagnosis not present

## 2021-11-19 DIAGNOSIS — Z992 Dependence on renal dialysis: Secondary | ICD-10-CM | POA: Diagnosis not present

## 2021-11-19 DIAGNOSIS — N2581 Secondary hyperparathyroidism of renal origin: Secondary | ICD-10-CM | POA: Diagnosis not present

## 2021-11-19 DIAGNOSIS — E039 Hypothyroidism, unspecified: Secondary | ICD-10-CM | POA: Diagnosis not present

## 2021-11-19 DIAGNOSIS — N186 End stage renal disease: Secondary | ICD-10-CM | POA: Diagnosis not present

## 2021-11-20 ENCOUNTER — Encounter: Payer: PPO | Admitting: Physician Assistant

## 2021-11-20 DIAGNOSIS — L97212 Non-pressure chronic ulcer of right calf with fat layer exposed: Secondary | ICD-10-CM | POA: Diagnosis not present

## 2021-11-20 DIAGNOSIS — N184 Chronic kidney disease, stage 4 (severe): Secondary | ICD-10-CM | POA: Diagnosis not present

## 2021-11-20 DIAGNOSIS — N4889 Other specified disorders of penis: Secondary | ICD-10-CM | POA: Diagnosis not present

## 2021-11-20 NOTE — Progress Notes (Addendum)
[name], [name] ([id number]) ?Visit Report for 11/20/2021 ?Chief Complaint Document Details ?Patient Name: [name], [name]. ?Date of Service: 11/20/2021 1:15 PM ?Medical Record Number: [id number] ?Patient Account Number: [id number] ?Date of Birth/Sex: 1940-12-23 (81 y.o. M) ?Treating RN: Carlene Coria ?Primary Care Provider: Jenna Luo Other Clinician: ?Referring Provider: Jenna Luo ?Treating Provider/Extender: Jeri Cos ?Weeks in Treatment: 25 ?Information Obtained from: Patient ?Chief Complaint ?Left LE Calciphylaxis and Glans Penis Calciphylaxis ?Electronic Signature(s) ?Signed: 11/20/2021 1:19:39 PM By: Worthy Keeler PA-C ?Entered By: Worthy Keeler on 11/20/2021 13:19:39 ?GREGOREY, NABOR L. ([id number]) ?-------------------------------------------------------------------------------- ?HPI Details ?Patient Name: [name], [name]. ?Date of Service: 11/20/2021 1:15 PM ?Medical Record Number: [id number] ?Patient Account Number: [id number] ?Date of Birth/Sex: 1941-06-08 (81 y.o. 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 This has been present for a couple of months currently he is on the sodium thiosulfate at ?dialysis. He tells me that they picked up on this quickly and have been managing it but he really has not had any help with anything else from the ?standpoint of progressing the wound itself. The patient does have again end-stage renal disease which currently is listed as stage IV but he is on ?renal dialysis. He also has coronary artery disease as well as going shortly for formal arterial studies although he did have a quick  test of the tibial ?artery with his vascular doctor when he went in to check on his fistula for his arm and subsequently they did not feel like he had any ?hemodynamically unstable flow into the lower extremity this is good news. Nonetheless I think that the biggest issue here is going to be getting ?some of the eschar off so that we get the wounds med headed in the appropriate direction. ?05/31/2021 upon evaluation today patient appears to be doing well with regard to his leg ulceration with calciphylaxis. I am very pleased in this ?regard. Fortunately there does not appear to be any signs of active infection at this time which is great news. No fevers, chills, nausea, vomiting, ?or diarrhea. ?06/07/2021 upon evaluation today patient appears to be doing well with regard to his legs. Unfortunately he is having an issue here with his penis ?as well he did mention at the end of last visit. I suggested that he go see urology. However when he went to see the urologist they basically told ?him there was not anything they could do. They recommended that we needed to be the ones to take care of this. With that being said this is an ?unusual area that to be honest a lot of the traditional wound care products we use are not really good to be good for this region. Potentially ?Medihoney alginate could be a possibility also think Santyl could be a possibility. With that being said I think initially my suggestion is probably ?can be for [place] to attempt the Santyl to see if that will benefit the patient. ?06/21/2021 upon evaluation today patient's wound bed actually showed signs of good granulation and epithelization at this point. Fortunately I do ?not see any signs of active infection systemically which is great news and his legs are doing awesome I think you are making great progress. The ?Santyl on  the glans penis also has been of benefit this is looking tremendously better. Again the patient is pleased he also tells me the  pain is ?significantly improved. Overall I think we are headed in the appropriate direction here. ?07/05/2021 upon evaluation today patient appears to be doing well with regard to his wounds. Both the area on the tip of his penis as well as the ?left lateral leg is doing well. There is some debridement this can be needed over the leg region. Fortunately I think that overall the Dakin's ?moistened gauze has done a great job here. No fevers, chills, nausea, vomiting, or diarrhea. ?07/12/2021 upon evaluation today patient presents for reevaluation here in the clinic and overall I think that his wounds are doing significantly ?better which is great news. This includes both the left leg as well as the glans of the penis. Both are showing signs of significant improvement ?which is great and overall I think that we are headed in the right direction. I do not see any evidence of active infection at this moment. ?07/19/2021 upon evaluation today patient appears to be doing better in regard to his wounds. Were getting much closer to complete resolution in ?regard to the necrotic tissue being removed from the leg. Overall I am extremely pleased with where we stand today. Overall I do not see any ?signs of active infection at this time. With regard to the patient's penis this area is showing signs of improvement as well and I am very pleased ?with that. ?07/26/2021 upon evaluation today patient appears to be doing well with regard to his wounds. The leg in fact is looking quite well and I am very ?pleased with where we stand today. I do not see any signs of infection which is great and there is a lot of new granulation growth also also. With ?regard to the penis area this also showed signs of excellent improvement which is great news. ?08/14/2020 upon evaluation today patient appears to be doing well with regard to his wounds both the leg as well as the glans penis region. Both ?are showing signs of improvement which is great news  and overall I am extremely pleased with where we stand at this point. I think that the ?Dakin's moistened gauze dressing for her leg is doing great and the Santyl is definitely helping with the glans penis location. ?08/28/2021 upon evaluation today patient appears to be doing well with regard to his wounds on the left leg as well as the penis. Fortunately both ?are showing signs of improvement. I am going to perform some debridement in regard to the leg there is a small area which is having trouble ?getting completely clean my work on that a little bit more today. ?09/04/2021 upon evaluation today patient appears to be doing pretty well in regard to his wounds. Still on the medial aspect of his left leg he has ?an area that seems to be wanting to try and spread as far as the Calciphylaxis is concerned. Again he has I just found out today been off of the ?sodium thiosulfate for about 2 to 3 weeks. He tells me that the initial "3 weeks" was stated to be up by the nurse. With that being said I think he ?probably is going to need it for a bit longer to be honest. We discussed that today. Over the penis area the Santyl does seem to be loose and a lot ?of this up and that is good hopefully will be able  to get this completely cleared off it sometime shortly. ?09/13/2021 upon evaluation today patient appears to be doing well with regard to his wound. He has been tolerating the dressing changes without ?complication. Fortunately there does not appear to be any signs of active infection locally or systemically which is great news. Overall I think that ?he is making progress with regard to his wounds in general. ?09/25/2021 upon evaluation today patient appears to be doing excellent in regard to his wounds. I am actually extremely pleased with where we ?stand today and I think he is making great progress. The patient likewise is happy to hear this. ?10/09/2021 upon evaluation today patient actually appears to be doing excellent in  regard to his wound. He has been tolerating the dressing ?changes without complication. Fortunately I do not see any evidence of active infection locally or systemically which is great news. No fevers, ?

## 2021-11-20 NOTE — Progress Notes (Addendum)
GAUTHAM, HEWINS L. ([id number]) ?Visit Report for 11/20/2021 ?Arrival Information Details ?Patient Name: [name], [name]. ?Date of Service: 11/20/2021 1:15 PM ?Medical Record Number: [id number] ?Patient Account Number: [id number] ?Date of Birth/Sex: [birth year] (81 y.o. M) ?Treating RN: Carlene Coria ?Primary Care Deldrick Linch: Jenna Luo Other Clinician: ?Referring Willye Javier: Jenna Luo ?Treating Jc Veron/Extender: Jeri Cos ?Weeks in Treatment: 25 ?Visit Information History Since Last Visit ?All ordered tests and consults were completed: No ?Patient Arrived: [name] ?Added or deleted any medications: No ?Arrival Time: 13:16 ?Any new allergies or adverse reactions: No ?Accompanied By: wife ?Had a fall or experienced change in No ?Transfer Assistance: None ?activities of daily living that may affect ?Patient Identification Verified: Yes ?risk of falls: ?Secondary Verification Process Completed: Yes ?Signs or symptoms of abuse/neglect since last visito No ?Patient Requires Transmission-Based Precautions: No ?Hospitalized since last visit: No ?Patient Has Alerts: No ?Implantable device outside of the clinic excluding No ?cellular tissue based products placed in the center ?since last visit: ?Has Dressing in Place as Prescribed: Yes ?Pain Present Now: No ?Electronic Signature(s) ?Signed: 11/20/2021 4:47:42 PM By: Carlene Coria RN ?Entered By: Carlene Coria on 11/20/2021 13:22:26 ?[name], [name] ([id number]) ?-------------------------------------------------------------------------------- ?Clinic Level of Care Assessment Details ?Patient Name: [name], [name]. ?Date of Service: 11/20/2021 1:15 PM ?Medical Record Number: [id number] ?Patient Account Number: [id number] ?Date of Birth/Sex: 1941/06/08 (81 y.o. M) ?Treating RN: Carlene Coria ?Primary Care Geovanny Sartin: Jenna Luo Other Clinician: ?Referring Maizie Garno: Jenna Luo ?Treating Berkley Wrightsman/Extender: Jeri Cos ?Weeks in Treatment: 25 ?Clinic Level of Care  Assessment Items ?TOOL 4 Quantity Score ?X - Use when only an EandM is performed on FOLLOW-UP visit 1 0 ?ASSESSMENTS - Nursing Assessment / Reassessment ?X - Reassessment of Co-morbidities (includes updates in patient status) 1 10 ?X- 1 5 ?Reassessment of Adherence to Treatment Plan ?ASSESSMENTS - Wound and Skin Assessment / Reassessment ?X - Simple Wound Assessment / Reassessment - one wound 1 5 ?[]  - 0 ?Complex Wound Assessment / Reassessment - multiple wounds ?[]  - 0 ?Dermatologic / Skin Assessment (not related to wound area) ?ASSESSMENTS - Focused Assessment ?[]  - Circumferential Edema Measurements - multi extremities 0 ?[]  - 0 ?Nutritional Assessment / Counseling / Intervention ?[]  - 0 ?Lower Extremity Assessment (monofilament, tuning fork, pulses) ?[]  - 0 ?Peripheral Arterial Disease Assessment (using hand held doppler) ?ASSESSMENTS - Ostomy and/or Continence Assessment and Care ?[]  - Incontinence Assessment and Management 0 ?[]  - 0 ?Ostomy Care Assessment and Management (repouching, etc.) ?PROCESS - Coordination of Care ?X - Simple Patient / Family Education for ongoing care 1 15 ?[]  - 0 ?Complex (extensive) Patient / Family Education for ongoing care ?[]  - 0 ?Staff obtains Consents, Records, Test Results / Process Orders ?[]  - 0 ?Staff telephones HHA, Nursing Homes / Clarify orders / etc ?[]  - 0 ?Routine Transfer to another Facility (non-emergent condition) ?[]  - 0 ?Routine Hospital Admission (non-emergent condition) ?[]  - 0 ?New Admissions / Biomedical engineer / Ordering NPWT, Apligraf, etc. ?[]  - 0 ?Emergency Hospital Admission (emergent condition) ?X- 1 10 ?Simple Discharge Coordination ?[]  - 0 ?Complex (extensive) Discharge Coordination ?PROCESS - Special Needs ?[]  - Pediatric / Minor Patient Management 0 ?[]  - 0 ?Isolation Patient Management ?[]  - 0 ?Hearing / Language / Visual special needs ?[]  - 0 ?Assessment of Community assistance (transportation, D/C planning, etc.) ?[]  - 0 ?Additional  assistance / Altered mentation ?[]  - 0 ?Support Surface(s) Assessment (bed, cushion, seat, etc.) ?INTERVENTIONS - Wound Cleansing / Measurement ?[name], [name] ([id number]) ?X- 1 5 ?Simple Wound Cleansing -  one wound ?[]  - 0 ?Complex Wound Cleansing - multiple wounds ?X- 1 5 ?Wound Imaging (photographs - any number of wounds) ?[]  - 0 ?Wound Tracing (instead of photographs) ?X- 1 5 ?Simple Wound Measurement - one wound ?[]  - 0 ?Complex Wound Measurement - multiple wounds ?INTERVENTIONS - Wound Dressings ?[]  - Small Wound Dressing one or multiple wounds 0 ?X- 1 15 ?Medium Wound Dressing one or multiple wounds ?[]  - 0 ?Large Wound Dressing one or multiple wounds ?[]  - 0 ?Application of Medications - topical ?[]  - 0 ?Application of Medications - injection ?INTERVENTIONS - Miscellaneous ?[]  - External ear exam 0 ?[]  - 0 ?Specimen Collection (cultures, biopsies, blood, body fluids, etc.) ?[]  - 0 ?Specimen(s) / Culture(s) sent or taken to Lab for analysis ?[]  - 0 ?Patient Transfer (multiple staff / Civil Service fast streamer / Similar devices) ?[]  - 0 ?Simple Staple / Suture removal (25 or less) ?[]  - 0 ?Complex Staple / Suture removal (26 or more) ?[]  - 0 ?Hypo / Hyperglycemic Management (close monitor of Blood Glucose) ?[]  - 0 ?Ankle / Brachial Index (ABI) - do not check if billed separately ?X- 1 5 ?Vital Signs ?Has the patient been seen at the hospital within the last three years: Yes ?Total Score: 80 ?Level Of Care: New/Established - Level ?3 ?Electronic Signature(s) ?Signed: 11/20/2021 4:47:42 PM By: Carlene Coria RN ?Entered By: Carlene Coria on 11/20/2021 13:43:17 ?[name], [name] ([id number]) ?-------------------------------------------------------------------------------- ?Encounter Discharge Information Details ?Patient Name: [name], [name] ?Date of Service: 11/20/2021 1:15 PM ?Medical Record Number: [id number] ?Patient Account Number: [id number] ?Date of Birth/Sex: [birth year] (81 y.o. M) ?Treating RN: Carlene Coria ?Primary  Care Eliam Snapp: Jenna Luo Other Clinician: ?Referring Glayds Insco: Jenna Luo ?Treating Cari Vandeberg/Extender: Jeri Cos ?Weeks in Treatment: 25 ?Encounter Discharge Information Items ?Discharge Condition: Stable ?Ambulatory Status: [name] ?Discharge Destination: Home ?Transportation: Private Auto ?Accompanied By: wife ?Schedule Follow-up Appointment: Yes ?Clinical Summary of Care: Patient Declined ?Electronic Signature(s) ?Signed: 11/20/2021 4:47:42 PM By: Carlene Coria RN ?Entered By: Carlene Coria on 11/20/2021 13:53:23 ?[name], [name] ([id number]) ?-------------------------------------------------------------------------------- ?Lower Extremity Assessment Details ?Patient Name: [name], [name]. ?Date of Service: 11/20/2021 1:15 PM ?Medical Record Number: [id number] ?Patient Account Number: [id number] ?Date of Birth/Sex: 12/20/1940 (81 y.o. M) ?Treating RN: Carlene Coria ?Primary Care Gayatri Teasdale: Jenna Luo Other Clinician: ?Referring Story Conti: Jenna Luo ?Treating Marquesha Robideau/Extender: Jeri Cos ?Weeks in Treatment: 25 ?Edema Assessment ?Assessed: [Left: No] [Right: No] ?Edema: [Left: Ye] [Right: s] ?Calf ?Left: Right: ?Point of Measurement: 34 cm From Medial Instep 32 cm ?Ankle ?Left: Right: ?Point of Measurement: 10 cm From Medial Instep 20 cm ?Electronic Signature(s) ?Signed: 11/20/2021 4:47:42 PM By: Carlene Coria RN ?Entered By: Carlene Coria on 11/20/2021 13:34:37 ?[name], [name] ([id number]) ?-------------------------------------------------------------------------------- ?Multi Wound Chart Details ?Patient Name: [name], [name]. ?Date of Service: 11/20/2021 1:15 PM ?Medical Record Number: [id number] ?Patient Account Number: [id number] ?Date of Birth/Sex: 01-09-1941 (81 y.o. M) ?Treating RN: Carlene Coria ?Primary Care Susanna Benge: Jenna Luo Other Clinician: ?Referring Lea Baine: Jenna Luo ?Treating Deavion Dobbs/Extender: Jeri Cos ?Weeks in Treatment: 25 ?Vital Signs ?Height(in):  68 ?Pulse(bpm): 73 ?Weight(lbs): 172 ?Blood Pressure(mmHg): 134/76 ?Body Mass Index(BMI): 26.1 ?Temperature(??F): 97.8 ?Respiratory Rate(breaths/min): 18 ?Photos: [N/A:N/A] ?Wound Location: Left, Posterior Lower Leg N/A N/A ?Wou

## 2021-11-20 NOTE — Progress Notes (Signed)
HAJIME, ASFAW L. ([id number]) ?Visit Report for 11/13/2021 ?Arrival Information Details ?Patient Name: ROYER, [name]. ?Date of Service: 11/13/2021 1:15 PM ?Medical Record Number: [id number] ?Patient Account Number: [id number] ?Date of Birth/Sex: 03-11-1941 (81 y.o. M) ?Treating RN: Carlene Coria ?Primary Care Joquan Lotz: Jenna Luo Other Clinician: ?Referring Lemar Bakos: Jenna Luo ?Treating Juliano Mceachin/Extender: Jeri Cos ?Weeks in Treatment: 24 ?Visit Information History Since Last Visit ?All ordered tests and consults were completed: No ?Patient Arrived: [name] ?Added or deleted any medications: No ?Arrival Time: 13:13 ?Any new allergies or adverse reactions: No ?Accompanied By: self ?Had a fall or experienced change in No ?Transfer Assistance: None ?activities of daily living that may affect ?Patient Identification Verified: Yes ?risk of falls: ?Secondary Verification Process Completed: Yes ?Signs or symptoms of abuse/neglect since last visito No ?Patient Requires Transmission-Based Precautions: No ?Hospitalized since last visit: No ?Patient Has Alerts: No ?Implantable device outside of the clinic excluding No ?cellular tissue based products placed in the center ?since last visit: ?Has Dressing in Place as Prescribed: Yes ?Pain Present Now: No ?Electronic Signature(s) ?Signed: 11/20/2021 9:16:22 AM By: Carlene Coria RN ?Entered By: Carlene Coria on 11/13/2021 13:19:12 ?Kozub, Shjon L. ([id number]) ?-------------------------------------------------------------------------------- ?Clinic Level of Care Assessment Details ?Patient Name: JAIMES, ECKERT. ?Date of Service: 11/13/2021 1:15 PM ?Medical Record Number: [id number] ?Patient Account Number: [id number] ?Date of Birth/Sex: [birth year] (81 y.o. M) ?Treating RN: Carlene Coria ?Primary Care Liesel Peckenpaugh: Jenna Luo Other Clinician: ?Referring Jewelene Mairena: Jenna Luo ?Treating Mckynlie Vanderslice/Extender: Jeri Cos ?Weeks in Treatment: 24 ?Clinic Level of Care  Assessment Items ?TOOL 4 Quantity Score ?X - Use when only an EandM is performed on FOLLOW-UP visit 1 0 ?ASSESSMENTS - Nursing Assessment / Reassessment ?X - Reassessment of Co-morbidities (includes updates in patient status) 1 10 ?X- 1 5 ?Reassessment of Adherence to Treatment Plan ?ASSESSMENTS - Wound and Skin Assessment / Reassessment ?X - Simple Wound Assessment / Reassessment - one wound 1 5 ?[]  - 0 ?Complex Wound Assessment / Reassessment - multiple wounds ?[]  - 0 ?Dermatologic / Skin Assessment (not related to wound area) ?ASSESSMENTS - Focused Assessment ?[]  - Circumferential Edema Measurements - multi extremities 0 ?[]  - 0 ?Nutritional Assessment / Counseling / Intervention ?[]  - 0 ?Lower Extremity Assessment (monofilament, tuning fork, pulses) ?[]  - 0 ?Peripheral Arterial Disease Assessment (using hand held doppler) ?ASSESSMENTS - Ostomy and/or Continence Assessment and Care ?[]  - Incontinence Assessment and Management 0 ?[]  - 0 ?Ostomy Care Assessment and Management (repouching, etc.) ?PROCESS - Coordination of Care ?X - Simple Patient / Family Education for ongoing care 1 15 ?[]  - 0 ?Complex (extensive) Patient / Family Education for ongoing care ?[]  - 0 ?Staff obtains Consents, Records, Test Results / Process Orders ?[]  - 0 ?Staff telephones HHA, Nursing Homes / Clarify orders / etc ?[]  - 0 ?Routine Transfer to another Facility (non-emergent condition) ?[]  - 0 ?Routine Hospital Admission (non-emergent condition) ?[]  - 0 ?New Admissions / Biomedical engineer / Ordering NPWT, Apligraf, etc. ?[]  - 0 ?Emergency Hospital Admission (emergent condition) ?X- 1 10 ?Simple Discharge Coordination ?[]  - 0 ?Complex (extensive) Discharge Coordination ?PROCESS - Special Needs ?[]  - Pediatric / Minor Patient Management 0 ?[]  - 0 ?Isolation Patient Management ?[]  - 0 ?Hearing / Language / Visual special needs ?[]  - 0 ?Assessment of Community assistance (transportation, D/C planning, etc.) ?[]  - 0 ?Additional  assistance / Altered mentation ?[]  - 0 ?Support Surface(s) Assessment (bed, cushion, seat, etc.) ?INTERVENTIONS - Wound Cleansing / Measurement ?KA, FLAMMER L. ([id number]) ?X- 1 5 ?Simple Wound Cleansing -  one wound ?[]  - 0 ?Complex Wound Cleansing - multiple wounds ?X- 1 5 ?Wound Imaging (photographs - any number of wounds) ?[]  - 0 ?Wound Tracing (instead of photographs) ?X- 1 5 ?Simple Wound Measurement - one wound ?[]  - 0 ?Complex Wound Measurement - multiple wounds ?INTERVENTIONS - Wound Dressings ?[]  - Small Wound Dressing one or multiple wounds 0 ?X- 1 15 ?Medium Wound Dressing one or multiple wounds ?[]  - 0 ?Large Wound Dressing one or multiple wounds ?[]  - 0 ?Application of Medications - topical ?[]  - 0 ?Application of Medications - injection ?INTERVENTIONS - Miscellaneous ?[]  - External ear exam 0 ?[]  - 0 ?Specimen Collection (cultures, biopsies, blood, body fluids, etc.) ?[]  - 0 ?Specimen(s) / Culture(s) sent or taken to Lab for analysis ?[]  - 0 ?Patient Transfer (multiple staff / Civil Service fast streamer / Similar devices) ?[]  - 0 ?Simple Staple / Suture removal (25 or less) ?[]  - 0 ?Complex Staple / Suture removal (26 or more) ?[]  - 0 ?Hypo / Hyperglycemic Management (close monitor of Blood Glucose) ?[]  - 0 ?Ankle / Brachial Index (ABI) - do not check if billed separately ?X- 1 5 ?Vital Signs ?Has the patient been seen at the hospital within the last three years: Yes ?Total Score: 80 ?Level Of Care: New/Established - Level ?3 ?Electronic Signature(s) ?Signed: 11/20/2021 9:16:22 AM By: Carlene Coria RN ?Entered By: Carlene Coria on 11/13/2021 13:52:40 ?Lilley, Yovani L. ([id number]) ?-------------------------------------------------------------------------------- ?Encounter Discharge Information Details ?Patient Name: DENIEL, MCQUISTON. ?Date of Service: 11/13/2021 1:15 PM ?Medical Record Number: [id number] ?Patient Account Number: [id number] ?Date of Birth/Sex: [birth year] (81 y.o. M) ?Treating RN: Carlene Coria ?Primary  Care Karleigh Bunte: Jenna Luo Other Clinician: ?Referring Alasia Enge: Jenna Luo ?Treating Veronique Warga/Extender: Jeri Cos ?Weeks in Treatment: 24 ?Encounter Discharge Information Items ?Discharge Condition: Stable ?Ambulatory Status: [name] ?Discharge Destination: Home ?Schedule Follow-up Appointment: Yes ?Clinical Summary of Care: Patient Declined ?Electronic Signature(s) ?Signed: 11/13/2021 1:54:52 PM By: Carlene Coria RN ?Entered By: Carlene Coria on 11/13/2021 13:54:52 ?Hagan, Ajani L. ([id number]) ?-------------------------------------------------------------------------------- ?Lower Extremity Assessment Details ?Patient Name: AMAURY, KUZEL. ?Date of Service: 11/13/2021 1:15 PM ?Medical Record Number: [id number] ?Patient Account Number: [id number] ?Date of Birth/Sex: Dec 04, 1940 (81 y.o. M) ?Treating RN: Carlene Coria ?Primary Care Makeya Hilgert: Jenna Luo Other Clinician: ?Referring Ramari Bray: Jenna Luo ?Treating Lindwood Mogel/Extender: Jeri Cos ?Weeks in Treatment: 24 ?Edema Assessment ?Assessed: [Left: No] [Right: No] ?Edema: [Left: Ye] [Right: s] ?Calf ?Left: Right: ?Point of Measurement: 34 cm From Medial Instep 21 cm ?Ankle ?Left: Right: ?Point of Measurement: 10 cm From Medial Instep 20 cm ?Vascular Assessment ?Pulses: ?Dorsalis Pedis ?Palpable: [Left:Yes] ?Electronic Signature(s) ?Signed: 11/20/2021 9:16:22 AM By: Carlene Coria RN ?Entered By: Carlene Coria on 11/13/2021 13:33:49 ?Kil, Khi L. ([id number]) ?-------------------------------------------------------------------------------- ?Multi Wound Chart Details ?Patient Name: BLESS, BELSHE. ?Date of Service: 11/13/2021 1:15 PM ?Medical Record Number: [id number] ?Patient Account Number: [id number] ?Date of Birth/Sex: 1940-09-03 (81 y.o. M) ?Treating RN: Carlene Coria ?Primary Care Jennylee Uehara: Jenna Luo Other Clinician: ?Referring Marijose Curington: Jenna Luo ?Treating Sharon Rubis/Extender: Jeri Cos ?Weeks in Treatment: 24 ?Vital  Signs ?Height(in): 68 ?Pulse(bpm): 76 ?Weight(lbs): 172 ?Blood Pressure(mmHg): 148/71 ?Body Mass Index(BMI): 26.1 ?Temperature(??F): 98.2 ?Respiratory Rate(breaths/min): 18 ?Photos: [N/A:N/A] ?Wound Location: Left, Posterior Lower

## 2021-11-26 DIAGNOSIS — E876 Hypokalemia: Secondary | ICD-10-CM | POA: Diagnosis not present

## 2021-11-26 DIAGNOSIS — N2581 Secondary hyperparathyroidism of renal origin: Secondary | ICD-10-CM | POA: Diagnosis not present

## 2021-11-26 DIAGNOSIS — Z992 Dependence on renal dialysis: Secondary | ICD-10-CM | POA: Diagnosis not present

## 2021-11-26 DIAGNOSIS — Z23 Encounter for immunization: Secondary | ICD-10-CM | POA: Diagnosis not present

## 2021-11-26 DIAGNOSIS — D631 Anemia in chronic kidney disease: Secondary | ICD-10-CM | POA: Diagnosis not present

## 2021-11-26 DIAGNOSIS — D509 Iron deficiency anemia, unspecified: Secondary | ICD-10-CM | POA: Diagnosis not present

## 2021-11-26 DIAGNOSIS — N186 End stage renal disease: Secondary | ICD-10-CM | POA: Diagnosis not present

## 2021-11-26 DIAGNOSIS — D689 Coagulation defect, unspecified: Secondary | ICD-10-CM | POA: Diagnosis not present

## 2021-11-27 ENCOUNTER — Encounter: Payer: PPO | Admitting: Physician Assistant

## 2021-11-28 DIAGNOSIS — G9341 Metabolic encephalopathy: Secondary | ICD-10-CM | POA: Diagnosis not present

## 2021-11-28 DIAGNOSIS — N189 Chronic kidney disease, unspecified: Secondary | ICD-10-CM | POA: Diagnosis not present

## 2021-11-28 DIAGNOSIS — N186 End stage renal disease: Secondary | ICD-10-CM | POA: Diagnosis not present

## 2021-11-28 DIAGNOSIS — A419 Sepsis, unspecified organism: Secondary | ICD-10-CM | POA: Diagnosis not present

## 2021-12-02 DIAGNOSIS — N186 End stage renal disease: Secondary | ICD-10-CM | POA: Diagnosis not present

## 2021-12-02 DIAGNOSIS — Z992 Dependence on renal dialysis: Secondary | ICD-10-CM | POA: Diagnosis not present

## 2021-12-02 DIAGNOSIS — E1122 Type 2 diabetes mellitus with diabetic chronic kidney disease: Secondary | ICD-10-CM | POA: Diagnosis not present

## 2021-12-03 DIAGNOSIS — D689 Coagulation defect, unspecified: Secondary | ICD-10-CM | POA: Diagnosis not present

## 2021-12-03 DIAGNOSIS — Z992 Dependence on renal dialysis: Secondary | ICD-10-CM | POA: Diagnosis not present

## 2021-12-03 DIAGNOSIS — N2581 Secondary hyperparathyroidism of renal origin: Secondary | ICD-10-CM | POA: Diagnosis not present

## 2021-12-03 DIAGNOSIS — N186 End stage renal disease: Secondary | ICD-10-CM | POA: Diagnosis not present

## 2021-12-03 DIAGNOSIS — D509 Iron deficiency anemia, unspecified: Secondary | ICD-10-CM | POA: Diagnosis not present

## 2021-12-03 DIAGNOSIS — E876 Hypokalemia: Secondary | ICD-10-CM | POA: Diagnosis not present

## 2021-12-04 ENCOUNTER — Encounter: Payer: PPO | Attending: Physician Assistant | Admitting: Physician Assistant

## 2021-12-04 DIAGNOSIS — L98492 Non-pressure chronic ulcer of skin of other sites with fat layer exposed: Secondary | ICD-10-CM | POA: Diagnosis not present

## 2021-12-04 DIAGNOSIS — L97822 Non-pressure chronic ulcer of other part of left lower leg with fat layer exposed: Secondary | ICD-10-CM | POA: Insufficient documentation

## 2021-12-04 DIAGNOSIS — N186 End stage renal disease: Secondary | ICD-10-CM | POA: Insufficient documentation

## 2021-12-04 DIAGNOSIS — I2510 Atherosclerotic heart disease of native coronary artery without angina pectoris: Secondary | ICD-10-CM | POA: Insufficient documentation

## 2021-12-04 DIAGNOSIS — L97222 Non-pressure chronic ulcer of left calf with fat layer exposed: Secondary | ICD-10-CM | POA: Diagnosis not present

## 2021-12-04 DIAGNOSIS — N4889 Other specified disorders of penis: Secondary | ICD-10-CM | POA: Insufficient documentation

## 2021-12-04 DIAGNOSIS — Z992 Dependence on renal dialysis: Secondary | ICD-10-CM | POA: Insufficient documentation

## 2021-12-04 DIAGNOSIS — N184 Chronic kidney disease, stage 4 (severe): Secondary | ICD-10-CM | POA: Diagnosis not present

## 2021-12-04 NOTE — Progress Notes (Addendum)
JAROME, TRULL L. ([id number]) ?Visit Report for 12/04/2021 ?Chief Complaint Document Details ?Patient Name: [name]. ?Date of Service: 12/04/2021 1:15 PM ?Medical Record Number: [id number] ?Patient Account Number: [id number] ?Date of Birth/Sex: 30-Jul-1941 (81 y.o. M) ?Treating RN: Cornell Barman ?Primary Care Provider: Jenna Luo Other Clinician: ?Referring Provider: Jenna Luo ?Treating Provider/Extender: Jeri Cos ?Weeks in Treatment: 27 ?Information Obtained from: Patient ?Chief Complaint ?Left LE Calciphylaxis and Glans Penis Calciphylaxis ?Electronic Signature(s) ?Signed: 12/04/2021 1:35:52 PM By: Worthy Keeler PA-C ?Entered By: Worthy Keeler on 12/04/2021 13:35:52 ?KEYNAN, HEFFERN L. ([id number]) ?-------------------------------------------------------------------------------- ?HPI Details ?Patient Name: Mark, [name]. ?Date of Service: 12/04/2021 1:15 PM ?Medical Record Number: [id number] ?Patient Account Number: [id number] ?Date of Birth/Sex: [birth year] (81 y.o. 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 This has been present for a couple of months currently he is on the sodium thiosulfate at ?dialysis. He tells me that they picked up on this quickly and have been managing it but he really has not had any help with anything else from the ?standpoint of progressing the wound itself. The patient does have again end-stage renal disease which currently is listed as stage IV but he is on ?renal dialysis. He also has coronary artery disease as well as going shortly for formal arterial studies although he did have a quick test of  the tibial ?artery with his vascular doctor when he went in to check on his fistula for his arm and subsequently they did not feel like he had any ?hemodynamically unstable flow into the lower extremity this is good news. Nonetheless I think that the biggest issue here is going to be getting ?some of the eschar off so that we get the wounds med headed in the appropriate direction. ?05/31/2021 upon evaluation today patient appears to be doing well with regard to his leg ulceration with calciphylaxis. I am very pleased in this ?regard. Fortunately there does not appear to be any signs of active infection at this time which is great news. No fevers, chills, nausea, vomiting, ?or diarrhea. ?06/07/2021 upon evaluation today patient appears to be doing well with regard to his legs. Unfortunately he is having an issue here with his penis ?as well he did mention at the end of last visit. I suggested that he go see urology. However when he went to see the urologist they basically told ?him there was not anything they could do. They recommended that we needed to be the ones to take care of this. With that being said this is an ?unusual area that to be honest a lot of the traditional wound care products we use are not really good to be good for this region. Potentially ?Medihoney alginate could be a possibility also think Santyl could be a possibility. With that being said I think initially my suggestion is probably ?can be for [place] to attempt the Santyl to see if that will benefit the patient. ?06/21/2021 upon evaluation today patient's wound bed actually showed signs of good granulation and epithelization at this point. Fortunately I do ?not see any signs of active infection systemically which is great news and his legs are doing awesome I think you are making great progress. The ?Santyl on  the glans penis also has been of benefit this is looking tremendously better. Again the patient is pleased he also tells me the pain  is ?significantly improved. Overall I think we are headed in the appropriate direction here. ?07/05/2021 upon evaluation today patient appears to be doing well with regard to his wounds. Both the area on the tip of his penis as well as the ?left lateral leg is doing well. There is some debridement this can be needed over the leg region. Fortunately I think that overall the Dakin's ?moistened gauze has done a great job here. No fevers, chills, nausea, vomiting, or diarrhea. ?07/12/2021 upon evaluation today patient presents for reevaluation here in the clinic and overall I think that his wounds are doing significantly ?better which is great news. This includes both the left leg as well as the glans of the penis. Both are showing signs of significant improvement ?which is great and overall I think that we are headed in the right direction. I do not see any evidence of active infection at this moment. ?07/19/2021 upon evaluation today patient appears to be doing better in regard to his wounds. Were getting much closer to complete resolution in ?regard to the necrotic tissue being removed from the leg. Overall I am extremely pleased with where we stand today. Overall I do not see any ?signs of active infection at this time. With regard to the patient's penis this area is showing signs of improvement as well and I am very pleased ?with that. ?07/26/2021 upon evaluation today patient appears to be doing well with regard to his wounds. The leg in fact is looking quite well and I am very ?pleased with where we stand today. I do not see any signs of infection which is great and there is a lot of new granulation growth also also. With ?regard to the penis area this also showed signs of excellent improvement which is great news. ?08/14/2020 upon evaluation today patient appears to be doing well with regard to his wounds both the leg as well as the glans penis region. Both ?are showing signs of improvement which is great news and  overall I am extremely pleased with where we stand at this point. I think that the ?Dakin's moistened gauze dressing for her leg is doing great and the Santyl is definitely helping with the glans penis location. ?08/28/2021 upon evaluation today patient appears to be doing well with regard to his wounds on the left leg as well as the penis. Fortunately both ?are showing signs of improvement. I am going to perform some debridement in regard to the leg there is a small area which is having trouble ?getting completely clean my work on that a little bit more today. ?09/04/2021 upon evaluation today patient appears to be doing pretty well in regard to his wounds. Still on the medial aspect of his left leg he has ?an area that seems to be wanting to try and spread as far as the Calciphylaxis is concerned. Again he has I just found out today been off of the ?sodium thiosulfate for about 2 to 3 weeks. He tells me that the initial "3 weeks" was stated to be up by the nurse. With that being said I think he ?probably is going to need it for a bit longer to be honest. We discussed that today. Over the penis area the Santyl does seem to be loose and a lot ?of this up and that is good hopefully will be able  to get this completely cleared off it sometime shortly. ?09/13/2021 upon evaluation today patient appears to be doing well with regard to his wound. He has been tolerating the dressing changes without ?complication. Fortunately there does not appear to be any signs of active infection locally or systemically which is great news. Overall I think that ?he is making progress with regard to his wounds in general. ?09/25/2021 upon evaluation today patient appears to be doing excellent in regard to his wounds. I am actually extremely pleased with where we ?stand today and I think he is making great progress. The patient likewise is happy to hear this. ?10/09/2021 upon evaluation today patient actually appears to be doing excellent in  regard to his wound. He has been tolerating the dressing ?changes without complication. Fortunately I do not see any evidence of active infection locally or systemically which is great news. No fevers, ?[place],

## 2021-12-04 NOTE — Progress Notes (Signed)
DEVONTAE, CASASOLA L. ([id number]) ?Visit Report for 12/04/2021 ?Arrival Information Details ?Patient Name: Mark, [name]. ?Date of Service: 12/04/2021 1:15 PM ?Medical Record Number: [id number] ?Patient Account Number: [id number] ?Date of Birth/Sex: Nov 30, 1940 (81 y.o. M) ?Treating RN: Cornell Barman ?Primary Care Grettel Rames: Jenna Luo Other Clinician: ?Referring Jacole Capley: Jenna Luo ?Treating Haivyn Oravec/Extender: Jeri [name] ?Weeks in Treatment: 27 ?Visit Information History Since Last Visit ?Added or deleted any medications: No ?Patient Arrived: [name] ?Has Dressing in Place as Prescribed: Yes ?Arrival Time: 13:25 ?Pain Present Now: No ?Accompanied By: wife ?Transfer Assistance: None ?Patient Identification Verified: Yes ?Secondary Verification Process Completed: Yes ?Patient Requires Transmission-Based Precautions: No ?Patient Has Alerts: No ?Electronic Signature(s) ?Signed: 12/04/2021 4:13:01 PM By: Gretta Cool, BSN, RN, CWS, Kim RN, BSN ?Entered By: Gretta Cool, BSN, RN, CWS, Kim on 12/04/2021 13:29:44 ?[name], [name] ([id number]) ?-------------------------------------------------------------------------------- ?Clinic Level of Care Assessment Details ?Patient Name: [name], [name]. ?Date of Service: 12/04/2021 1:15 PM ?Medical Record Number: [id number] ?Patient Account Number: [id number] ?Date of Birth/Sex: 1941/01/15 (81 y.o. M) ?Treating RN: Cornell Barman ?Primary Care Tayvin Preslar: Jenna Luo Other Clinician: ?Referring Annmargaret Decaprio: Jenna Luo ?Treating Kara Mierzejewski/Extender: Jeri [name] ?Weeks in Treatment: 27 ?Clinic Level of Care Assessment Items ?TOOL 4 Quantity Score ?[]  - Use when only an EandM is performed on FOLLOW-UP visit 0 ?ASSESSMENTS - Nursing Assessment / Reassessment ?X - Reassessment of Co-morbidities (includes updates in patient status) 1 10 ?X- 1 5 ?Reassessment of Adherence to Treatment Plan ?ASSESSMENTS - Wound and Skin Assessment / Reassessment ?X - Simple Wound Assessment / Reassessment - one wound 1 5 ?[]   - 0 ?Complex Wound Assessment / Reassessment - multiple wounds ?[]  - 0 ?Dermatologic / Skin Assessment (not related to wound area) ?ASSESSMENTS - Focused Assessment ?[]  - Circumferential Edema Measurements - multi extremities 0 ?[]  - 0 ?Nutritional Assessment / Counseling / Intervention ?[]  - 0 ?Lower Extremity Assessment (monofilament, tuning fork, pulses) ?[]  - 0 ?Peripheral Arterial Disease Assessment (using hand held doppler) ?ASSESSMENTS - Ostomy and/or Continence Assessment and Care ?[]  - Incontinence Assessment and Management 0 ?[]  - 0 ?Ostomy Care Assessment and Management (repouching, etc.) ?PROCESS - Coordination of Care ?X - Simple Patient / Family Education for ongoing care 1 15 ?[]  - 0 ?Complex (extensive) Patient / Family Education for ongoing care ?X- 1 10 ?Staff obtains Consents, Records, Test Results / Process Orders ?[]  - 0 ?Staff telephones HHA, Nursing Homes / Clarify orders / etc ?[]  - 0 ?Routine Transfer to another Facility (non-emergent condition) ?[]  - 0 ?Routine Hospital Admission (non-emergent condition) ?[]  - 0 ?New Admissions / Biomedical engineer / Ordering NPWT, Apligraf, etc. ?[]  - 0 ?Emergency Hospital Admission (emergent condition) ?X- 1 10 ?Simple Discharge Coordination ?[]  - 0 ?Complex (extensive) Discharge Coordination ?PROCESS - Special Needs ?[]  - Pediatric / Minor Patient Management 0 ?[]  - 0 ?Isolation Patient Management ?[]  - 0 ?Hearing / Language / Visual special needs ?[]  - 0 ?Assessment of Community assistance (transportation, D/C planning, etc.) ?[]  - 0 ?Additional assistance / Altered mentation ?[]  - 0 ?Support Surface(s) Assessment (bed, cushion, seat, etc.) ?INTERVENTIONS - Wound Cleansing / Measurement ?Mark, ABDALLAH L. ([id number]) ?X- 1 5 ?Simple Wound Cleansing - one wound ?[]  - 0 ?Complex Wound Cleansing - multiple wounds ?X- 1 5 ?Wound Imaging (photographs - any number of wounds) ?[]  - 0 ?Wound Tracing (instead of photographs) ?X- 1 5 ?Simple Wound  Measurement - one wound ?[]  - 0 ?Complex Wound Measurement - multiple wounds ?INTERVENTIONS - Wound Dressings ?[]  - Small Wound Dressing one or multiple  wounds 0 ?X- 1 15 ?Medium Wound Dressing one or multiple wounds ?[]  - 0 ?Large Wound Dressing one or multiple wounds ?[]  - 0 ?Application of Medications - topical ?[]  - 0 ?Application of Medications - injection ?INTERVENTIONS - Miscellaneous ?[]  - External ear exam 0 ?[]  - 0 ?Specimen Collection (cultures, biopsies, blood, body fluids, etc.) ?[]  - 0 ?Specimen(s) / Culture(s) sent or taken to Lab for analysis ?[]  - 0 ?Patient Transfer (multiple staff / Civil Service fast streamer / Similar devices) ?[]  - 0 ?Simple Staple / Suture removal (25 or less) ?[]  - 0 ?Complex Staple / Suture removal (26 or more) ?[]  - 0 ?Hypo / Hyperglycemic Management (close monitor of Blood Glucose) ?[]  - 0 ?Ankle / Brachial Index (ABI) - do not check if billed separately ?X- 1 5 ?Vital Signs ?Has the patient been seen at the hospital within the last three years: Yes ?Total Score: 90 ?Level Of Care: New/Established - Level ?3 ?Electronic Signature(s) ?Signed: 12/04/2021 4:13:01 PM By: Gretta Cool, BSN, RN, CWS, Kim RN, BSN ?Entered By: Gretta Cool, BSN, RN, CWS, Kim on 12/04/2021 14:09:30 ?[name], [name] ([id number]) ?-------------------------------------------------------------------------------- ?Encounter Discharge Information Details ?Patient Name: Mark, [name]. ?Date of Service: 12/04/2021 1:15 PM ?Medical Record Number: [id number] ?Patient Account Number: [id number] ?Date of Birth/Sex: [birth year] (81 y.o. M) ?Treating RN: Cornell Barman ?Primary Care Yemariam Magar: Jenna Luo Other Clinician: ?Referring Cleotilde Spadaccini: Jenna Luo ?Treating Dereck Agerton/Extender: Jeri [name] ?Weeks in Treatment: 27 ?Encounter Discharge Information Items ?Discharge Condition: Stable ?Ambulatory Status: Ambulatory ?Discharge Destination: Home ?Transportation: Private Auto ?Accompanied By: self ?Schedule Follow-up Appointment: Yes ?Clinical  Summary of Care: ?Electronic Signature(s) ?Signed: 12/04/2021 4:13:01 PM By: Gretta Cool, BSN, RN, CWS, Kim RN, BSN ?Entered By: Gretta Cool, BSN, RN, CWS, Kim on 12/04/2021 14:10:24 ?Najjar, [name] ([id number]) ?-------------------------------------------------------------------------------- ?Lower Extremity Assessment Details ?Patient Name: Mark, [name]. ?Date of Service: 12/04/2021 1:15 PM ?Medical Record Number: [id number] ?Patient Account Number: [id number] ?Date of Birth/Sex: September 09, 1940 (81 y.o. M) ?Treating RN: Cornell Barman ?Primary Care Cleopha Indelicato: Jenna Luo Other Clinician: ?Referring Cicilia Clinger: Jenna Luo ?Treating Lorree Millar/Extender: Jeri [name] ?Weeks in Treatment: 27 ?Edema Assessment ?Assessed: [Left: No] [Right: No] ?[Left: Edema] [Right: :] ?Calf ?Left: Right: ?Point of Measurement: From Medial Instep 34.5 cm ?Ankle ?Left: Right: ?Point of Measurement: From Medial Instep 21 cm ?Notes ?Per his request, patient kept his shoe on. Used largest area at ankle and calf for edema measurement. Was not able to check pulses. ?Electronic Signature(s) ?Signed: 12/04/2021 4:13:01 PM By: Gretta Cool, BSN, RN, CWS, Kim RN, BSN ?Entered By: Gretta Cool, BSN, RN, CWS, Kim on 12/04/2021 13:44:18 ?[name], [name] ([id number]) ?-------------------------------------------------------------------------------- ?Multi Wound Chart Details ?Patient Name: Mark, [name]. ?Date of Service: 12/04/2021 1:15 PM ?Medical Record Number: [id number] ?Patient Account Number: [id number] ?Date of Birth/Sex: [birth year] (81 y.o. M) ?Treating RN: Cornell Barman ?Primary Care Corynne Scibilia: Jenna Luo Other Clinician: ?Referring [name]: Jenna Luo ?Treating [name]/Extender: Jeri [name] ?Weeks in Treatment: 27 ?Vital Signs ?Height(in): 68 ?Pulse(bpm): 61 ?Weight(lbs): 172 ?Blood Pressure(mmHg): 118/56 ?Body Mass Index(BMI): 26.1 ?Temperature(??F): 97.9 ?Respiratory Rate(breaths/min): 18 ?Photos: [N/A:N/A] ?Wound Location: Left, Posterior Lower Leg N/A  N/A ?Wounding Event: Gradually Appeared N/A N/A ?Primary Etiology: Calciphylaxis N/A N/A ?Comorbid History: Coronary Artery Disease, Type II N/A N/A ?Diabetes ?Date Acquired: 04/05/2021 N/A N/A ?Weeks of Treatment:

## 2021-12-10 DIAGNOSIS — D509 Iron deficiency anemia, unspecified: Secondary | ICD-10-CM | POA: Diagnosis not present

## 2021-12-10 DIAGNOSIS — N186 End stage renal disease: Secondary | ICD-10-CM | POA: Diagnosis not present

## 2021-12-10 DIAGNOSIS — D631 Anemia in chronic kidney disease: Secondary | ICD-10-CM | POA: Diagnosis not present

## 2021-12-10 DIAGNOSIS — E876 Hypokalemia: Secondary | ICD-10-CM | POA: Diagnosis not present

## 2021-12-10 DIAGNOSIS — Z992 Dependence on renal dialysis: Secondary | ICD-10-CM | POA: Diagnosis not present

## 2021-12-10 DIAGNOSIS — D689 Coagulation defect, unspecified: Secondary | ICD-10-CM | POA: Diagnosis not present

## 2021-12-10 DIAGNOSIS — N2581 Secondary hyperparathyroidism of renal origin: Secondary | ICD-10-CM | POA: Diagnosis not present

## 2021-12-11 ENCOUNTER — Encounter: Payer: PPO | Admitting: Physician Assistant

## 2021-12-11 DIAGNOSIS — N184 Chronic kidney disease, stage 4 (severe): Secondary | ICD-10-CM | POA: Diagnosis not present

## 2021-12-11 DIAGNOSIS — L97222 Non-pressure chronic ulcer of left calf with fat layer exposed: Secondary | ICD-10-CM | POA: Diagnosis not present

## 2021-12-11 DIAGNOSIS — Z992 Dependence on renal dialysis: Secondary | ICD-10-CM | POA: Diagnosis not present

## 2021-12-11 NOTE — Progress Notes (Addendum)
BRAN, ALDRIDGE L. ([id number]) ?Visit Report for 12/11/2021 ?Chief Complaint Document Details ?Patient Name: [name], [name]. ?Date of Service: 12/11/2021 1:15 PM ?Medical Record Number: [id number] ?Patient Account Number: [id number] ?Date of Birth/Sex: 1940-12-12 (81 y.o. M) ?Treating RN: Carlene Coria ?Primary Care Provider: Jenna Luo Other Clinician: ?Referring Provider: Jenna Luo ?Treating Provider/Extender: Jeri Cos ?Weeks in Treatment: 28 ?Information Obtained from: Patient ?Chief Complaint ?Left LE Calciphylaxis and Glans Penis Calciphylaxis ?Electronic Signature(s) ?Signed: 12/11/2021 1:19:46 PM By: Worthy Keeler PA-C ?Entered By: Worthy Keeler on 12/11/2021 13:19:46 ?FILIMON, MIRANDA L. ([id number]) ?-------------------------------------------------------------------------------- ?HPI Details ?Patient Name: [name], [name]. ?Date of Service: 12/11/2021 1:15 PM ?Medical Record Number: [id number] ?Patient Account Number: [id number] ?Date of Birth/Sex: [birth year] (81 y.o. 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 This has been present for a couple of months currently he is on the sodium thiosulfate at ?dialysis. He tells me that they picked up on this quickly and have been managing it but he really has not had any help with anything else from the ?standpoint of progressing the wound itself. The patient does have again end-stage renal disease which currently is listed as stage IV but he is on ?renal dialysis. He also has coronary artery disease as well as going shortly for formal arterial studies although he did have a quick test  of the tibial ?artery with his vascular doctor when he went in to check on his fistula for his arm and subsequently they did not feel like he had any ?hemodynamically unstable flow into the lower extremity this is good news. Nonetheless I think that the biggest issue here is going to be getting ?some of the eschar off so that we get the wounds med headed in the appropriate direction. ?05/31/2021 upon evaluation today patient appears to be doing well with regard to his leg ulceration with calciphylaxis. I am very pleased in this ?regard. Fortunately there does not appear to be any signs of active infection at this time which is great news. No fevers, chills, nausea, vomiting, ?or diarrhea. ?06/07/2021 upon evaluation today patient appears to be doing well with regard to his legs. Unfortunately he is having an issue here with his penis ?as well he did mention at the end of last visit. I suggested that he go see urology. However when he went to see the urologist they basically told ?him there was not anything they could do. They recommended that we needed to be the ones to take care of this. With that being said this is an ?unusual area that to be honest a lot of the traditional wound care products we use are not really good to be good for this region. Potentially ?Medihoney alginate could be a possibility also think Santyl could be a possibility. With that being said I think initially my suggestion is probably ?can be for [place] to attempt the Santyl to see if that will benefit the patient. ?06/21/2021 upon evaluation today patient's wound bed actually showed signs of good granulation and epithelization at this point. Fortunately I do ?not see any signs of active infection systemically which is great news and his legs are doing awesome I think you are making great progress. The ?Santyl on  the glans penis also has been of benefit this is looking tremendously better. Again the patient is pleased he also tells me the pain  is ?significantly improved. Overall I think we are headed in the appropriate direction here. ?07/05/2021 upon evaluation today patient appears to be doing well with regard to his wounds. Both the area on the tip of his penis as well as the ?left lateral leg is doing well. There is some debridement this can be needed over the leg region. Fortunately I think that overall the Dakin's ?moistened gauze has done a great job here. No fevers, chills, nausea, vomiting, or diarrhea. ?07/12/2021 upon evaluation today patient presents for reevaluation here in the clinic and overall I think that his wounds are doing significantly ?better which is great news. This includes both the left leg as well as the glans of the penis. Both are showing signs of significant improvement ?which is great and overall I think that we are headed in the right direction. I do not see any evidence of active infection at this moment. ?07/19/2021 upon evaluation today patient appears to be doing better in regard to his wounds. Were getting much closer to complete resolution in ?regard to the necrotic tissue being removed from the leg. Overall I am extremely pleased with where we stand today. Overall I do not see any ?signs of active infection at this time. With regard to the patient's penis this area is showing signs of improvement as well and I am very pleased ?with that. ?07/26/2021 upon evaluation today patient appears to be doing well with regard to his wounds. The leg in fact is looking quite well and I am very ?pleased with where we stand today. I do not see any signs of infection which is great and there is a lot of new granulation growth also also. With ?regard to the penis area this also showed signs of excellent improvement which is great news. ?08/14/2020 upon evaluation today patient appears to be doing well with regard to his wounds both the leg as well as the glans penis region. Both ?are showing signs of improvement which is great news and  overall I am extremely pleased with where we stand at this point. I think that the ?Dakin's moistened gauze dressing for her leg is doing great and the Santyl is definitely helping with the glans penis location. ?08/28/2021 upon evaluation today patient appears to be doing well with regard to his wounds on the left leg as well as the penis. Fortunately both ?are showing signs of improvement. I am going to perform some debridement in regard to the leg there is a small area which is having trouble ?getting completely clean my work on that a little bit more today. ?09/04/2021 upon evaluation today patient appears to be doing pretty well in regard to his wounds. Still on the medial aspect of his left leg he has ?an area that seems to be wanting to try and spread as far as the Calciphylaxis is concerned. Again he has I just found out today been off of the ?sodium thiosulfate for about 2 to 3 weeks. He tells me that the initial "3 weeks" was stated to be up by the nurse. With that being said I think he ?probably is going to need it for a bit longer to be honest. We discussed that today. Over the penis area the Santyl does seem to be loose and a lot ?of this up and that is good hopefully will be able  to get this completely cleared off it sometime shortly. ?09/13/2021 upon evaluation today patient appears to be doing well with regard to his wound. He has been tolerating the dressing changes without ?complication. Fortunately there does not appear to be any signs of active infection locally or systemically which is great news. Overall I think that ?he is making progress with regard to his wounds in general. ?09/25/2021 upon evaluation today patient appears to be doing excellent in regard to his wounds. I am actually extremely pleased with where we ?stand today and I think he is making great progress. The patient likewise is happy to hear this. ?10/09/2021 upon evaluation today patient actually appears to be doing excellent in  regard to his wound. He has been tolerating the dressing ?changes without complication. Fortunately I do not see any evidence of active infection locally or systemically which is great news. No fevers, ?TERR

## 2021-12-11 NOTE — Progress Notes (Addendum)
KALMAN, NYLEN L. ([id number]) ?Visit Report for 12/11/2021 ?Arrival Information Details ?Patient Name: [name], [name]. ?Date of Service: 12/11/2021 1:15 PM ?Medical Record Number: [id number] ?Patient Account Number: [id number] ?Date of Birth/Sex: 01/05/1941 (81 y.o. M) ?Treating RN: Carlene Coria ?Primary Care Sanoe Hazan: Jenna Luo Other Clinician: ?Referring Syrita Dovel: Jenna Luo ?Treating Dearion Huot/Extender: Jeri Cos ?Weeks in Treatment: 28 ?Visit Information History Since Last Visit ?All ordered tests and consults were completed: No ?Patient Arrived: [name] ?Added or deleted any medications: No ?Arrival Time: 13:20 ?Any new allergies or adverse reactions: No ?Accompanied By: wife ?Had a fall or experienced change in No ?Transfer Assistance: None ?activities of daily living that may affect ?Patient Identification Verified: Yes ?risk of falls: ?Secondary Verification Process Completed: Yes ?Signs or symptoms of abuse/neglect since last visito No ?Patient Requires Transmission-Based Precautions: No ?Hospitalized since last visit: No ?Patient Has Alerts: No ?Implantable device outside of the clinic excluding No ?cellular tissue based products placed in the center ?since last visit: ?Has Dressing in Place as Prescribed: Yes ?Pain Present Now: No ?Electronic Signature(s) ?Signed: 12/12/2021 3:40:11 PM By: Carlene Coria RN ?Entered By: Carlene Coria on 12/11/2021 13:25:41 ?[name], [name] ([id number]) ?-------------------------------------------------------------------------------- ?Clinic Level of Care Assessment Details ?Patient Name: [name], [name]. ?Date of Service: 12/11/2021 1:15 PM ?Medical Record Number: [id number] ?Patient Account Number: [id number] ?Date of Birth/Sex: [birth year] (81 y.o. M) ?Treating RN: Carlene Coria ?Primary Care Greidys Deland: Jenna Luo Other Clinician: ?Referring Johan Creveling: Jenna Luo ?Treating Rolly Magri/Extender: Jeri Cos ?Weeks in Treatment: 28 ?Clinic Level of Care Assessment  Items ?TOOL 4 Quantity Score ?X - Use when only an EandM is performed on FOLLOW-UP visit 1 0 ?ASSESSMENTS - Nursing Assessment / Reassessment ?X - Reassessment of Co-morbidities (includes updates in patient status) 1 10 ?X- 1 5 ?Reassessment of Adherence to Treatment Plan ?ASSESSMENTS - Wound and Skin Assessment / Reassessment ?X - Simple Wound Assessment / Reassessment - one wound 1 5 ?[]  - 0 ?Complex Wound Assessment / Reassessment - multiple wounds ?[]  - 0 ?Dermatologic / Skin Assessment (not related to wound area) ?ASSESSMENTS - Focused Assessment ?[]  - Circumferential Edema Measurements - multi extremities 0 ?[]  - 0 ?Nutritional Assessment / Counseling / Intervention ?[]  - 0 ?Lower Extremity Assessment (monofilament, tuning fork, pulses) ?[]  - 0 ?Peripheral Arterial Disease Assessment (using hand held doppler) ?ASSESSMENTS - Ostomy and/or Continence Assessment and Care ?[]  - Incontinence Assessment and Management 0 ?[]  - 0 ?Ostomy Care Assessment and Management (repouching, etc.) ?PROCESS - Coordination of Care ?X - Simple Patient / Family Education for ongoing care 1 15 ?[]  - 0 ?Complex (extensive) Patient / Family Education for ongoing care ?[]  - 0 ?Staff obtains Consents, Records, Test Results / Process Orders ?[]  - 0 ?Staff telephones HHA, Nursing Homes / Clarify orders / etc ?[]  - 0 ?Routine Transfer to another Facility (non-emergent condition) ?[]  - 0 ?Routine Hospital Admission (non-emergent condition) ?[]  - 0 ?New Admissions / Biomedical engineer / Ordering NPWT, Apligraf, etc. ?[]  - 0 ?Emergency Hospital Admission (emergent condition) ?X- 1 10 ?Simple Discharge Coordination ?[]  - 0 ?Complex (extensive) Discharge Coordination ?PROCESS - Special Needs ?[]  - Pediatric / Minor Patient Management 0 ?[]  - 0 ?Isolation Patient Management ?[]  - 0 ?Hearing / Language / Visual special needs ?[]  - 0 ?Assessment of Community assistance (transportation, D/C planning, etc.) ?[]  - 0 ?Additional assistance /  Altered mentation ?[]  - 0 ?Support Surface(s) Assessment (bed, cushion, seat, etc.) ?INTERVENTIONS - Wound Cleansing / Measurement ?[name], [name] ([id number]) ?X- 1 5 ?Simple Wound Cleansing -  one wound ?[]  - 0 ?Complex Wound Cleansing - multiple wounds ?X- 1 5 ?Wound Imaging (photographs - any number of wounds) ?[]  - 0 ?Wound Tracing (instead of photographs) ?X- 1 5 ?Simple Wound Measurement - one wound ?[]  - 0 ?Complex Wound Measurement - multiple wounds ?INTERVENTIONS - Wound Dressings ?X - Small Wound Dressing one or multiple wounds 1 10 ?[]  - 0 ?Medium Wound Dressing one or multiple wounds ?[]  - 0 ?Large Wound Dressing one or multiple wounds ?[]  - 0 ?Application of Medications - topical ?[]  - 0 ?Application of Medications - injection ?INTERVENTIONS - Miscellaneous ?[]  - External ear exam 0 ?[]  - 0 ?Specimen Collection (cultures, biopsies, blood, body fluids, etc.) ?[]  - 0 ?Specimen(s) / Culture(s) sent or taken to Lab for analysis ?[]  - 0 ?Patient Transfer (multiple staff / Civil Service fast streamer / Similar devices) ?[]  - 0 ?Simple Staple / Suture removal (25 or less) ?[]  - 0 ?Complex Staple / Suture removal (26 or more) ?[]  - 0 ?Hypo / Hyperglycemic Management (close monitor of Blood Glucose) ?[]  - 0 ?Ankle / Brachial Index (ABI) - do not check if billed separately ?X- 1 5 ?Vital Signs ?Has the patient been seen at the hospital within the last three years: Yes ?Total Score: 75 ?Level Of Care: New/Established - Level ?2 ?Electronic Signature(s) ?Signed: 12/12/2021 3:40:11 PM By: Carlene Coria RN ?Entered By: Carlene Coria on 12/11/2021 14:09:32 ?[name], [name] ([id number]) ?-------------------------------------------------------------------------------- ?Encounter Discharge Information Details ?Patient Name: [name], [name]. ?Date of Service: 12/11/2021 1:15 PM ?Medical Record Number: [id number] ?Patient Account Number: [id number] ?Date of Birth/Sex: [birth year] (81 y.o. M) ?Treating RN: Carlene Coria ?Primary Care July Nickson:  Jenna Luo Other Clinician: ?Referring Himmat Enberg: Jenna Luo ?Treating Kindell Strada/Extender: Jeri Cos ?Weeks in Treatment: 28 ?Encounter Discharge Information Items ?Discharge Condition: Stable ?Ambulatory Status: [name] ?Discharge Destination: Home ?Transportation: Private Auto ?Accompanied By: self ?Schedule Follow-up Appointment: Yes ?Clinical Summary of Care: Patient Declined ?Electronic Signature(s) ?Signed: 12/12/2021 3:40:11 PM By: Carlene Coria RN ?Entered By: Carlene Coria on 12/11/2021 14:10:26 ?Hribar, Kayton L. ([id number]) ?-------------------------------------------------------------------------------- ?Lower Extremity Assessment Details ?Patient Name: [name], [name]. ?Date of Service: 12/11/2021 1:15 PM ?Medical Record Number: [id number] ?Patient Account Number: [id number] ?Date of Birth/Sex: [birth year] (81 y.o. M) ?Treating RN: Carlene Coria ?Primary Care Amdrew Oboyle: Jenna Luo Other Clinician: ?Referring Yanelis Osika: Jenna Luo ?Treating Annaliza Zia/Extender: Jeri Cos ?Weeks in Treatment: 28 ?Edema Assessment ?Assessed: [Left: No] [Right: No] ?Edema: [Left: Ye] [Right: s] ?Calf ?Left: Right: ?Point of Measurement: From Medial Instep 32 cm ?Ankle ?Left: Right: ?Point of Measurement: From Medial Instep 21 cm ?Vascular Assessment ?Pulses: ?Dorsalis Pedis ?Palpable: [Left:Yes] ?Electronic Signature(s) ?Signed: 12/12/2021 3:40:11 PM By: Carlene Coria RN ?Entered By: Carlene Coria on 12/11/2021 13:34:15 ?Ewald, Gomer L. ([id number]) ?-------------------------------------------------------------------------------- ?Multi Wound Chart Details ?Patient Name: [name], [name]. ?Date of Service: 12/11/2021 1:15 PM ?Medical Record Number: [id number] ?Patient Account Number: [id number] ?Date of Birth/Sex: 07/04/1941 (81 y.o. M) ?Treating RN: Carlene Coria ?Primary Care Nicolis Boody: Jenna Luo Other Clinician: ?Referring Aaron Bostwick: Jenna Luo ?Treating Primo Innis/Extender: Jeri Cos ?Weeks in Treatment:  28 ?Vital Signs ?Height(in): 68 ?Pulse(bpm): 78 ?Weight(lbs): 172 ?Blood Pressure(mmHg): 131/61 ?Body Mass Index(BMI): 26.1 ?Temperature(??F): 97.8 ?Respiratory Rate(breaths/min): 18 ?Photos: [N/A:N/A] ?Wou

## 2021-12-17 DIAGNOSIS — D509 Iron deficiency anemia, unspecified: Secondary | ICD-10-CM | POA: Diagnosis not present

## 2021-12-17 DIAGNOSIS — N2581 Secondary hyperparathyroidism of renal origin: Secondary | ICD-10-CM | POA: Diagnosis not present

## 2021-12-17 DIAGNOSIS — E1122 Type 2 diabetes mellitus with diabetic chronic kidney disease: Secondary | ICD-10-CM | POA: Diagnosis not present

## 2021-12-17 DIAGNOSIS — Z992 Dependence on renal dialysis: Secondary | ICD-10-CM | POA: Diagnosis not present

## 2021-12-17 DIAGNOSIS — D689 Coagulation defect, unspecified: Secondary | ICD-10-CM | POA: Diagnosis not present

## 2021-12-17 DIAGNOSIS — N186 End stage renal disease: Secondary | ICD-10-CM | POA: Diagnosis not present

## 2021-12-17 DIAGNOSIS — Z23 Encounter for immunization: Secondary | ICD-10-CM | POA: Diagnosis not present

## 2021-12-18 ENCOUNTER — Encounter: Payer: PPO | Admitting: Physician Assistant

## 2021-12-18 DIAGNOSIS — E113313 Type 2 diabetes mellitus with moderate nonproliferative diabetic retinopathy with macular edema, bilateral: Secondary | ICD-10-CM | POA: Diagnosis not present

## 2021-12-18 LAB — HM DIABETES EYE EXAM

## 2021-12-24 DIAGNOSIS — N2581 Secondary hyperparathyroidism of renal origin: Secondary | ICD-10-CM | POA: Diagnosis not present

## 2021-12-24 DIAGNOSIS — D689 Coagulation defect, unspecified: Secondary | ICD-10-CM | POA: Diagnosis not present

## 2021-12-24 DIAGNOSIS — D631 Anemia in chronic kidney disease: Secondary | ICD-10-CM | POA: Diagnosis not present

## 2021-12-24 DIAGNOSIS — E876 Hypokalemia: Secondary | ICD-10-CM | POA: Diagnosis not present

## 2021-12-24 DIAGNOSIS — D509 Iron deficiency anemia, unspecified: Secondary | ICD-10-CM | POA: Diagnosis not present

## 2021-12-24 DIAGNOSIS — Z992 Dependence on renal dialysis: Secondary | ICD-10-CM | POA: Diagnosis not present

## 2021-12-24 DIAGNOSIS — N186 End stage renal disease: Secondary | ICD-10-CM | POA: Diagnosis not present

## 2021-12-25 ENCOUNTER — Encounter: Payer: PPO | Admitting: Physician Assistant

## 2021-12-25 DIAGNOSIS — Z992 Dependence on renal dialysis: Secondary | ICD-10-CM | POA: Diagnosis not present

## 2021-12-25 DIAGNOSIS — L97222 Non-pressure chronic ulcer of left calf with fat layer exposed: Secondary | ICD-10-CM | POA: Diagnosis not present

## 2021-12-25 DIAGNOSIS — N184 Chronic kidney disease, stage 4 (severe): Secondary | ICD-10-CM | POA: Diagnosis not present

## 2021-12-25 NOTE — Progress Notes (Signed)
TALLIN, HART ([id number]) Visit Report for 12/25/2021 Chief Complaint Document Details Patient Name: [name], [name]. Date of Service: 12/25/2021 1:15 PM Medical Record Number: [id number] Patient Account Number: [id number] Date of Birth/Sex: June 14, 1941 (81 y.o. 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 Date of Service: 12/25/2021 1:15 PM Medical Record Number: [id number] Patient Account Number: [id number] Date of Birth/Sex: 04/30/1941 (81 y.o. 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 Edema Control - Lymphedema / Segmental Compressive Device / Other o Elevate, Exercise Daily and Avoid Standing for Long Periods of Time. o Elevate legs to the level of the heart and pump ankles as often  as possible o Elevate leg(s) parallel to the floor when sitting. Wound Treatment Wound #1 - Lower Leg Wound Laterality: Left, Posterior Primary Dressing: Gauze 1 x Per Day/30 Days Discharge Instructions: As directed: moistened with Dakins Solution Secondary Dressing: Kerlix 4.5 x 4.1 (in/yd) (Generic) 1 x Per Day/30 Days Discharge Instructions: Apply Kerlix 4.5 x 4.1 (in/yd) as instructed Secured With: Medipore Tape - 19M Medipore H Soft Cloth Surgical Tape, 2x2 (in/yd) (Generic) 1 x Per Day/30 Days Electronic Signature(s) Unsigned Entered ByCarlene Coria on 12/25/2021 14:08:46 Signature(s): Date(s): Mallinger, Majd L. ([id number]) -------------------------------------------------------------------------------- Problem List Details Patient Name: [name], [name] Date of Service: 12/25/2021 1:15 PM Medical Record Number: [id number] Patient Account Number: [id number] Date of Birth/Sex: 05-03-1941 (81 y.o. 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 05/24/2021 No Yes L97.822 Non-pressure chronic ulcer of other part of left lower leg with fat layer 05/24/2021 No Yes exposed L98.492 Non-pressure chronic ulcer of skin of other sites with fat layer exposed 06/07/2021 No Yes Z99.2 Dependence on renal dialysis 05/24/2021 No Yes N18.4 Chronic kidney disease, stage 4 (severe) 05/24/2021 No Yes I25.10 Atherosclerotic heart disease of native coronary artery without angina 05/24/2021 No Yes pectoris Inactive Problems Resolved Problems Electronic Signature(s) Signed: 12/25/2021 2:04:46 PM By: Worthy Keeler PA-C Entered By: Worthy Keeler on 12/25/2021 14:04:45 Franey, Rad L.  ([id number]) -------------------------------------------------------------------------------- [place] Details Patient Name: Mark Morale L. Date of Service: 12/25/2021 Medical Record Number: [id number] Patient Account Number: [id number] Date of Birth/Sex: 09-05-1940 (81 y.o. 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

## 2021-12-27 NOTE — Progress Notes (Signed)
Mark, [name] ([id number]) Visit Report for 12/25/2021 Arrival Information Details Patient Name: [name], [name]. Date of Service: 12/25/2021 1:15 PM Medical Record Number: [id number] Patient Account Number: [id number] Date of Birth/Sex: 03/28/1941 (81 y.o. M) Treating RN: Carlene Coria Primary Care Lexee Brashears: Jenna Luo Other Clinician: Referring Niajah Sipos: Jenna Luo Treating Nasira Janusz/Extender: Skipper Cliche in Treatment: 46 Visit Information History Since Last Visit All ordered tests and consults were completed: No Patient Arrived: [name] Added or deleted any medications: No Arrival Time: 13:18 Any new allergies or adverse reactions: No Accompanied By: wife Had a fall or experienced change in No Transfer Assistance: None activities of daily living that may affect Patient Identification Verified: Yes risk of falls: Secondary Verification Process Completed: Yes Signs or symptoms of abuse/neglect since last visito No Patient Requires Transmission-Based Precautions: No Hospitalized since last visit: No Patient Has Alerts: No Implantable device outside of the clinic excluding No cellular tissue based products placed in the center since last visit: Has Dressing in Place as Prescribed: Yes Pain Present Now: No Electronic Signature(s) Signed: 12/27/2021 2:19:12 PM By: Carlene Coria RN Entered By: Carlene Coria on 12/25/2021 13:23:05 Ponciano, Dakotah L. ([id number]) -------------------------------------------------------------------------------- Clinic Level of Care Assessment Details Patient Name: Mark Morale L. Date of Service: 12/25/2021 1:15 PM Medical Record Number: [id number] Patient Account Number: [id number] Date of Birth/Sex: February 17, 1941 (81 y.o. M) Treating RN: Carlene Coria Primary Care [name]: Jenna Luo Other Clinician: Referring Erikka Follmer: Jenna Luo Treating Maghen Group/Extender: Skipper Cliche in Treatment: 30 Clinic Level of Care  Assessment Items TOOL 4 Quantity Score X - Use when only an EandM is performed on FOLLOW-UP visit 1 0 ASSESSMENTS - Nursing Assessment / Reassessment X - Reassessment of Co-morbidities (includes updates in patient status) 1 10 X- 1 5 Reassessment of Adherence to Treatment Plan ASSESSMENTS - Wound and Skin Assessment / Reassessment X - Simple Wound Assessment / Reassessment - one wound 1 5 []  - 0 Complex Wound Assessment / Reassessment - multiple wounds []  - 0 Dermatologic / Skin Assessment (not related to wound area) ASSESSMENTS - Focused Assessment []  - Circumferential Edema Measurements - multi extremities 0 []  - 0 Nutritional Assessment / Counseling / Intervention []  - 0 Lower Extremity Assessment (monofilament, tuning fork, pulses) []  - 0 Peripheral Arterial Disease Assessment (using hand held doppler) ASSESSMENTS - Ostomy and/or Continence Assessment and Care []  - Incontinence Assessment and Management 0 []  - 0 Ostomy Care Assessment and Management (repouching, etc.) PROCESS - Coordination of Care X - Simple Patient / Family Education for ongoing care 1 15 []  - 0 Complex (extensive) Patient / Family Education for ongoing care []  - 0 Staff obtains Programmer, systems, Records, Test Results / Process Orders []  - 0 Staff telephones HHA, Nursing Homes / Clarify orders / etc []  - 0 Routine Transfer to another Facility (non-emergent condition) []  - 0 Routine Hospital Admission (non-emergent condition) []  - 0 New Admissions / Biomedical engineer / Ordering NPWT, Apligraf, etc. []  - 0 Emergency Hospital Admission (emergent condition) X- 1 10 Simple Discharge Coordination []  - 0 Complex (extensive) Discharge Coordination PROCESS - Special Needs []  - Pediatric / Minor Patient Management 0 []  - 0 Isolation Patient Management []  - 0 Hearing / Language / Visual special needs []  - 0 Assessment of Community assistance (transportation, D/C planning, etc.) []  - 0 Additional  assistance / Altered mentation []  - 0 Support Surface(s) Assessment (bed, cushion, seat, etc.) INTERVENTIONS - Wound Cleansing / Measurement Owen, Frutoso L. ([id number]) X- 1 5 Simple Wound Cleansing -  one wound []  - 0 Complex Wound Cleansing - multiple wounds X- 1 5 Wound Imaging (photographs - any number of wounds) []  - 0 Wound Tracing (instead of photographs) X- 1 5 Simple Wound Measurement - one wound []  - 0 Complex Wound Measurement - multiple wounds INTERVENTIONS - Wound Dressings []  - Small Wound Dressing one or multiple wounds 0 X- 1 15 Medium Wound Dressing one or multiple wounds []  - 0 Large Wound Dressing one or multiple wounds []  - 0 Application of Medications - topical []  - 0 Application of Medications - injection INTERVENTIONS - Miscellaneous []  - External ear exam 0 []  - 0 Specimen Collection (cultures, biopsies, blood, body fluids, etc.) []  - 0 Specimen(s) / Culture(s) sent or taken to Lab for analysis []  - 0 Patient Transfer (multiple staff / Civil Service fast streamer / Similar devices) []  - 0 Simple Staple / Suture removal (25 or less) []  - 0 Complex Staple / Suture removal (26 or more) []  - 0 Hypo / Hyperglycemic Management (close monitor of Blood Glucose) []  - 0 Ankle / Brachial Index (ABI) - do not check if billed separately X- 1 5 Vital Signs Has the patient been seen at the hospital within the last three years: Yes Total Score: 80 Level Of Care: New/Established - Level 3 Electronic Signature(s) Signed: 12/27/2021 2:19:12 PM By: Carlene Coria RN Entered By: Carlene Coria on 12/25/2021 14:09:12 Wragg, Jock L. ([id number]) -------------------------------------------------------------------------------- Encounter Discharge Information Details Patient Name: Mark Morale L. Date of Service: 12/25/2021 1:15 PM Medical Record Number: [id number] Patient Account Number: [id number] Date of Birth/Sex: [birth year] (81 y.o. M) Treating RN: Carlene Coria Primary  Care [name]: Jenna Luo Other Clinician: Referring Hartford Maulden: Jenna Luo Treating Stevin Bielinski/Extender: Skipper Cliche in Treatment: 30 Encounter Discharge Information Items Discharge Condition: Stable Ambulatory Status: Walker Discharge Destination: Home Transportation: Private Auto Accompanied By: wife Schedule Follow-up Appointment: Yes Clinical Summary of Care: Electronic Signature(s) Signed: 12/27/2021 2:19:12 PM By: Carlene Coria RN Entered By: Carlene Coria on 12/25/2021 14:09:58 Gupta, Draeden L. ([id number]) -------------------------------------------------------------------------------- Lower Extremity Assessment Details Patient Name: Klose, Vue L. Date of Service: 12/25/2021 1:15 PM Medical Record Number: [id number] Patient Account Number: [id number] Date of Birth/Sex: 09-26-1940 (81 y.o. M) Treating RN: Carlene Coria Primary Care Juwuan Sedita: Jenna Luo Other Clinician: Referring Aric Jost: Jenna Luo Treating Magdaleno Lortie/Extender: Skipper Cliche in Treatment: 30 Edema Assessment Assessed: [Left: No] [Right: No] Edema: [Left: Ye] [Right: s] Calf Left: Right: Point of Measurement: From Medial Instep 35 cm Ankle Left: Right: Point of Measurement: From Medial Instep 20.5 cm Electronic Signature(s) Signed: 12/27/2021 2:19:12 PM By: Carlene Coria RN Entered By: Carlene Coria on 12/25/2021 13:32:16 Melena, Ysmael L. ([id number]) -------------------------------------------------------------------------------- Multi Wound Chart Details Patient Name: Mark Morale L. Date of Service: 12/25/2021 1:15 PM Medical Record Number: [id number] Patient Account Number: [id number] Date of Birth/Sex: [birth year] (81 y.o. M) Treating RN: Carlene Coria Primary Care Saara Kijowski: Jenna Luo Other Clinician: Referring Avilyn Virtue: Jenna Luo Treating Shaley Leavens/Extender: Skipper Cliche in Treatment: 30 Vital Signs Height(in): 68 Pulse(bpm): 1 Weight(lbs):  172 Blood Pressure(mmHg): 122/59 Body Mass Index(BMI): 26.1 Temperature(F): 97.6 Respiratory Rate(breaths/min): 18 Photos: [N/A:N/A] Wound Location: Left, Posterior Lower Leg N/A N/A Wounding Event: Gradually Appeared N/A N/A Primary Etiology: Calciphylaxis N/A N/A Comorbid History: Coronary Artery Disease, Type II N/A N/A Diabetes Date Acquired: 04/05/2021 N/A N/A Weeks of Treatment: 30 N/A N/A Wound Status: Open N/A N/A Wound Recurrence: No N/A N/A Clustered Wound: Yes N/A N/A Clustered Quantity: 3 N/A N/A Measurements L x W x D (cm) 8x12x0.1  N/A N/A Area (cm) : 75.398 N/A N/A Volume (cm) : 7.54 N/A N/A % Reduction in Area: 56.40% N/A N/A % Reduction in Volume: 89.10% N/A N/A Classification: Full Thickness Without Exposed N/A N/A Support Structures Exudate Amount: Medium N/A N/A Exudate Type: Serosanguineous N/A N/A Exudate Color: red, brown N/A N/A Granulation Amount: Large (67-100%) N/A N/A Granulation Quality: Red, Hyper-granulation N/A N/A Necrotic Amount: None Present (0%) N/A N/A Exposed Structures: Fat Layer (Subcutaneous Tissue): N/A N/A Yes Fascia: No Tendon: No Muscle: No Joint: No Bone: No Epithelialization: None N/A N/A Treatment Notes Electronic Signature(s) Signed: 12/27/2021 2:19:12 PM By: Carlene Coria RN Entered By: Carlene Coria on 12/25/2021 13:32:37 Spraker, Rinaldo L. ([id number]) Minch, Antawn Carlean Jews ([id number]) -------------------------------------------------------------------------------- [place] Details Patient Name: Mark Morale L. Date of Service: 12/25/2021 1:15 PM Medical Record Number: [id number] Patient Account Number: [id number] Date of Birth/Sex: 20-Aug-1940 (81 y.o. M) Treating RN: Carlene Coria Primary Care Shawntina Diffee: Jenna Luo Other Clinician: Referring Keeghan Mcintire: Jenna Luo Treating Genova Kiner/Extender: Skipper Cliche in Treatment: 30 Active Inactive Wound/Skin Impairment Nursing  Diagnoses: Knowledge deficit related to ulceration/compromised skin integrity Goals: Patient/caregiver will verbalize understanding of skin care regimen Date Initiated: 05/24/2021 Target Resolution Date: 01/08/2022 Goal Status: Active Ulcer/skin breakdown will have a volume reduction of 30% by week 4 Date Initiated: 05/24/2021 Date Inactivated: 08/14/2021 Target Resolution Date: 07/24/2021 Goal Status: Unmet Unmet Reason: comorbites Ulcer/skin breakdown will have a volume reduction of 50% by week 8 Date Initiated: 05/24/2021 Date Inactivated: 09/25/2021 Target Resolution Date: 08/24/2021 Goal Status: Unmet Unmet Reason: comorbities Ulcer/skin breakdown will have a volume reduction of 80% by week 12 Date Initiated: 05/24/2021 Date Inactivated: 09/25/2021 Target Resolution Date: 09/24/2021 Goal Status: Unmet Unmet Reason: comorbities Ulcer/skin breakdown will heal within 14 weeks Date Initiated: 05/24/2021 Date Inactivated: 10/30/2021 Target Resolution Date: 10/22/2021 Goal Status: Unmet Unmet Reason: comorbities Interventions: Assess patient/caregiver ability to obtain necessary supplies Assess patient/caregiver ability to perform ulcer/skin care regimen upon admission and as needed Assess ulceration(s) every visit Notes: Electronic Signature(s) Signed: 12/27/2021 2:19:12 PM By: Carlene Coria RN Entered By: Carlene Coria on 12/25/2021 13:32:21 Romig, Breyton L. ([id number]) -------------------------------------------------------------------------------- Pain Assessment Details Patient Name: Mark Morale L. Date of Service: 12/25/2021 1:15 PM Medical Record Number: [id number] Patient Account Number: [id number] Date of Birth/Sex: [birth year] (81 y.o. M) Treating RN: Carlene Coria Primary Care Fletcher Rathbun: Jenna Luo Other Clinician: Referring Jahkai Yandell: Jenna Luo Treating Kataleena Holsapple/Extender: Skipper Cliche in Treatment: 30 Active Problems Location of Pain Severity and  Description of Pain Patient Has Paino No Site Locations Pain Management and Medication Current Pain Management: Electronic Signature(s) Signed: 12/27/2021 2:19:12 PM By: Carlene Coria RN Entered By: Carlene Coria on 12/25/2021 13:23:36 Vader, Semisi L. ([id number]) -------------------------------------------------------------------------------- Patient/Caregiver Education Details Patient Name: Mark Morale L. Date of Service: 12/25/2021 1:15 PM Medical Record Number: [id number] Patient Account Number: [id number] Date of Birth/Gender: 03/26/1941 (81 y.o. M) Treating RN: Carlene Coria Primary Care Physician: Jenna Luo Other Clinician: Referring Physician: Jenna Luo Treating Physician/Extender: Skipper Cliche in Treatment: 30 Education Assessment Education Provided To: Patient Education Topics Provided Wound/Skin Impairment: Methods: Explain/Verbal Responses: State content correctly Electronic Signature(s) Signed: 12/27/2021 2:19:12 PM By: Carlene Coria RN Entered By: Carlene Coria on 12/25/2021 14:09:26 Birge, Kenner L. ([id number]) -------------------------------------------------------------------------------- Wound Assessment Details Patient Name: Dann, Mickael L. Date of Service: 12/25/2021 1:15 PM Medical Record Number: [id number] Patient Account Number: [id number] Date of Birth/Sex: Apr 28, 1941 (81 y.o. M) Treating RN: Carlene Coria Primary Care Clema Skousen: Jenna Luo Other Clinician: Referring Woodrow Dulski: Jenna Luo Treating Jolynda Townley/Extender: Joaquim Lai,  Hoyt Weeks in Treatment: 30 Wound Status Wound Number: 1 Primary Etiology: Calciphylaxis Wound Location: Left, Posterior Lower Leg Wound Status: Open Wounding Event: Gradually Appeared Comorbid History: Coronary Artery Disease, Type II Diabetes Date Acquired: 04/05/2021 Weeks Of Treatment: 30 Clustered Wound: Yes Photos Wound Measurements Length: (cm) 8 Width: (cm) 12 Depth: (cm) 0.1 Clustered  Quantity: 3 Area: (cm) 75.398 Volume: (cm) 7.54 % Reduction in Area: 56.4% % Reduction in Volume: 89.1% Epithelialization: None Tunneling: No Undermining: No Wound Description Classification: Full Thickness Without Exposed Support Structu Exudate Amount: Medium Exudate Type: Serosanguineous Exudate Color: red, brown res Foul Odor After Cleansing: No Slough/Fibrino No Wound Bed Granulation Amount: Large (67-100%) Exposed Structure Granulation Quality: Red, Hyper-granulation Fascia Exposed: No Necrotic Amount: None Present (0%) Fat Layer (Subcutaneous Tissue) Exposed: Yes Tendon Exposed: No Muscle Exposed: No Joint Exposed: No Bone Exposed: No Treatment Notes Wound #1 (Lower Leg) Wound Laterality: Left, Posterior Cleanser Peri-Wound Care Topical Escorcia, Nabeel L. ([id number]) Primary Dressing Gauze Discharge Instruction: As directed: moistened with Dakins Solution Secondary Dressing Kerlix 4.5 x 4.1 (in/yd) Discharge Instruction: Apply Kerlix 4.5 x 4.1 (in/yd) as instructed Secured With Medipore Tape - 30M Medipore H Soft Cloth Surgical Tape, 2x2 (in/yd) Compression Wrap Compression Stockings Add-Ons Electronic Signature(s) Signed: 12/27/2021 2:19:12 PM By: Carlene Coria RN Entered By: Carlene Coria on 12/25/2021 13:31:26 Lemaire, Marquize L. ([id number]) -------------------------------------------------------------------------------- Vitals Details Patient Name: Mark Morale L. Date of Service: 12/25/2021 1:15 PM Medical Record Number: [id number] Patient Account Number: [id number] Date of Birth/Sex: May 21, 1941 (81 y.o. M) Treating RN: Carlene Coria Primary Care Kaige Whistler: Jenna Luo Other Clinician: Referring Jennafer Gladue: Jenna Luo Treating Adriane Gabbert/Extender: Skipper Cliche in Treatment: 30 Vital Signs Time Taken: 13:23 Temperature (F): 97.6 Height (in): 68 Pulse (bpm): 66 Weight (lbs): 172 Respiratory Rate (breaths/min): 18 Body Mass Index (BMI):  26.1 Blood Pressure (mmHg): 122/59 Reference Range: 80 - 120 mg / dl Electronic Signature(s) Signed: 12/27/2021 2:19:12 PM By: Carlene Coria RN Entered By: Carlene Coria on 12/25/2021 13:23:27

## 2021-12-28 DIAGNOSIS — G9341 Metabolic encephalopathy: Secondary | ICD-10-CM | POA: Diagnosis not present

## 2021-12-28 DIAGNOSIS — A419 Sepsis, unspecified organism: Secondary | ICD-10-CM | POA: Diagnosis not present

## 2021-12-28 DIAGNOSIS — N189 Chronic kidney disease, unspecified: Secondary | ICD-10-CM | POA: Diagnosis not present

## 2021-12-28 DIAGNOSIS — N186 End stage renal disease: Secondary | ICD-10-CM | POA: Diagnosis not present

## 2021-12-31 DIAGNOSIS — D689 Coagulation defect, unspecified: Secondary | ICD-10-CM | POA: Diagnosis not present

## 2021-12-31 DIAGNOSIS — N186 End stage renal disease: Secondary | ICD-10-CM | POA: Diagnosis not present

## 2021-12-31 DIAGNOSIS — D509 Iron deficiency anemia, unspecified: Secondary | ICD-10-CM | POA: Diagnosis not present

## 2021-12-31 DIAGNOSIS — E876 Hypokalemia: Secondary | ICD-10-CM | POA: Diagnosis not present

## 2021-12-31 DIAGNOSIS — Z992 Dependence on renal dialysis: Secondary | ICD-10-CM | POA: Diagnosis not present

## 2021-12-31 DIAGNOSIS — N2581 Secondary hyperparathyroidism of renal origin: Secondary | ICD-10-CM | POA: Diagnosis not present

## 2022-01-01 ENCOUNTER — Encounter: Payer: PPO | Admitting: Physician Assistant

## 2022-01-02 DIAGNOSIS — Z992 Dependence on renal dialysis: Secondary | ICD-10-CM | POA: Diagnosis not present

## 2022-01-02 DIAGNOSIS — E1122 Type 2 diabetes mellitus with diabetic chronic kidney disease: Secondary | ICD-10-CM | POA: Diagnosis not present

## 2022-01-02 DIAGNOSIS — N186 End stage renal disease: Secondary | ICD-10-CM | POA: Diagnosis not present

## 2022-01-04 DIAGNOSIS — D509 Iron deficiency anemia, unspecified: Secondary | ICD-10-CM | POA: Diagnosis not present

## 2022-01-04 DIAGNOSIS — Z992 Dependence on renal dialysis: Secondary | ICD-10-CM | POA: Diagnosis not present

## 2022-01-04 DIAGNOSIS — D689 Coagulation defect, unspecified: Secondary | ICD-10-CM | POA: Diagnosis not present

## 2022-01-04 DIAGNOSIS — N2581 Secondary hyperparathyroidism of renal origin: Secondary | ICD-10-CM | POA: Diagnosis not present

## 2022-01-04 DIAGNOSIS — N186 End stage renal disease: Secondary | ICD-10-CM | POA: Diagnosis not present

## 2022-01-07 DIAGNOSIS — N2581 Secondary hyperparathyroidism of renal origin: Secondary | ICD-10-CM | POA: Diagnosis not present

## 2022-01-07 DIAGNOSIS — D689 Coagulation defect, unspecified: Secondary | ICD-10-CM | POA: Diagnosis not present

## 2022-01-07 DIAGNOSIS — Z992 Dependence on renal dialysis: Secondary | ICD-10-CM | POA: Diagnosis not present

## 2022-01-07 DIAGNOSIS — E876 Hypokalemia: Secondary | ICD-10-CM | POA: Diagnosis not present

## 2022-01-07 DIAGNOSIS — N186 End stage renal disease: Secondary | ICD-10-CM | POA: Diagnosis not present

## 2022-01-07 DIAGNOSIS — D509 Iron deficiency anemia, unspecified: Secondary | ICD-10-CM | POA: Diagnosis not present

## 2022-01-07 DIAGNOSIS — D631 Anemia in chronic kidney disease: Secondary | ICD-10-CM | POA: Diagnosis not present

## 2022-01-08 ENCOUNTER — Encounter: Payer: PPO | Attending: Physician Assistant | Admitting: Physician Assistant

## 2022-01-08 DIAGNOSIS — L97822 Non-pressure chronic ulcer of other part of left lower leg with fat layer exposed: Secondary | ICD-10-CM | POA: Insufficient documentation

## 2022-01-08 DIAGNOSIS — L98492 Non-pressure chronic ulcer of skin of other sites with fat layer exposed: Secondary | ICD-10-CM | POA: Insufficient documentation

## 2022-01-08 DIAGNOSIS — N186 End stage renal disease: Secondary | ICD-10-CM | POA: Diagnosis not present

## 2022-01-08 DIAGNOSIS — N4889 Other specified disorders of penis: Secondary | ICD-10-CM | POA: Diagnosis not present

## 2022-01-08 DIAGNOSIS — Z992 Dependence on renal dialysis: Secondary | ICD-10-CM | POA: Diagnosis not present

## 2022-01-08 DIAGNOSIS — E1122 Type 2 diabetes mellitus with diabetic chronic kidney disease: Secondary | ICD-10-CM | POA: Diagnosis not present

## 2022-01-08 DIAGNOSIS — I2510 Atherosclerotic heart disease of native coronary artery without angina pectoris: Secondary | ICD-10-CM | POA: Diagnosis not present

## 2022-01-08 DIAGNOSIS — L97222 Non-pressure chronic ulcer of left calf with fat layer exposed: Secondary | ICD-10-CM | POA: Diagnosis not present

## 2022-01-08 NOTE — Progress Notes (Addendum)
CHIBUEZE, BEASLEY ([id number]) Visit Report for 01/08/2022 Arrival Information Details Patient Name: [name], [name]. Date of Service: 01/08/2022 2:30 PM Medical Record Number: [id number] Patient Account Number: [id number] Date of Birth/Sex: [birth year] (81 y.o. M) Treating RN: [name] Primary Care [name]: [name] Other Clinician: Massie [name] Referring [name]: [name] Treating [name]/Extender: [name] in Treatment: 81 Visit Information History Since Last Visit Added or deleted any medications: No Patient Arrived: [name] Any new allergies or adverse reactions: No Arrival Time: 14:45 Had a fall or experienced change in No Transfer Assistance: None activities of daily living that may affect Patient Requires Transmission-Based Precautions: No risk of falls: Patient Has Alerts: No Signs or symptoms of abuse/neglect since last visito No Hospitalized since last visit: No Pain Present Now: No Electronic Signature(s) Signed: 01/08/2022 4:20:44 PM By: [name] Entered By: [name] on 01/08/2022 14:47:27 [name], [name] ([id number]) -------------------------------------------------------------------------------- Clinic Level of Care Assessment Details Patient Name: Mark Morale L. Date of Service: 01/08/2022 2:30 PM Medical Record Number: [id number] Patient Account Number: [id number] Date of Birth/Sex: 12/14/1940 (81 y.o. M) Treating RN: [name] Primary Care [name]: [name] Other Clinician: Referring [name]: [name] Treating [name]/Extender: [name] in Treatment: 81 Clinic Level of Care Assessment Items TOOL 4 Quantity Score []  - Use when only an EandM is performed on FOLLOW-UP visit 0 ASSESSMENTS - Nursing Assessment / Reassessment X - Reassessment of Co-morbidities (includes updates in patient status) 1 10 X- 1 5 Reassessment of Adherence to Treatment Plan ASSESSMENTS - Wound and Skin  Assessment / Reassessment X - Simple Wound Assessment / Reassessment - one wound 1 5 []  - 0 Complex Wound Assessment / Reassessment - multiple wounds []  - 0 Dermatologic / Skin Assessment (not related to wound area) ASSESSMENTS - Focused Assessment []  - Circumferential Edema Measurements - multi extremities 0 []  - 0 Nutritional Assessment / Counseling / Intervention []  - 0 Lower Extremity Assessment (monofilament, tuning fork, pulses) []  - 0 Peripheral Arterial Disease Assessment (using hand held doppler) ASSESSMENTS - Ostomy and/or Continence Assessment and Care []  - Incontinence Assessment and Management 0 []  - 0 Ostomy Care Assessment and Management (repouching, etc.) PROCESS - Coordination of Care X - Simple Patient / Family Education for ongoing care 1 15 []  - 0 Complex (extensive) Patient / Family Education for ongoing care X- 1 10 Staff obtains Consents, Records, Test Results / Process Orders []  - 0 Staff telephones HHA, Nursing Homes / Clarify orders / etc []  - 0 Routine Transfer to another Facility (non-emergent condition) []  - 0 Routine Hospital Admission (non-emergent condition) []  - 0 New Admissions / Biomedical engineer / Ordering NPWT, Apligraf, etc. []  - 0 Emergency Hospital Admission (emergent condition) X- 1 10 Simple Discharge Coordination []  - 0 Complex (extensive) Discharge Coordination PROCESS - Special Needs []  - Pediatric / Minor Patient Management 0 []  - 0 Isolation Patient Management []  - 0 Hearing / Language / Visual special needs []  - 0 Assessment of Community assistance (transportation, D/C planning, etc.) []  - 0 Additional assistance / Altered mentation []  - 0 Support Surface(s) Assessment (bed, cushion, seat, etc.) INTERVENTIONS - Wound Cleansing / Measurement Tineo, Johnhenry L. ([id number]) X- 1 5 Simple Wound Cleansing - one wound []  - 0 Complex Wound Cleansing - multiple wounds X- 1 5 Wound Imaging (photographs - any number  of wounds) []  - 0 Wound Tracing (instead of photographs) X- 1 5 Simple Wound Measurement - one wound []  - 0 Complex Wound Measurement -  multiple wounds INTERVENTIONS - Wound Dressings []  - Small Wound Dressing one or multiple wounds 0 X- 1 15 Medium Wound Dressing one or multiple wounds []  - 0 Large Wound Dressing one or multiple wounds []  - 0 Application of Medications - topical []  - 0 Application of Medications - injection INTERVENTIONS - Miscellaneous []  - External ear exam 0 []  - 0 Specimen Collection (cultures, biopsies, blood, body fluids, etc.) []  - 0 Specimen(s) / Culture(s) sent or taken to Lab for analysis []  - 0 Patient Transfer (multiple staff / Civil Service fast streamer / Similar devices) []  - 0 Simple Staple / Suture removal (25 or less) []  - 0 Complex Staple / Suture removal (26 or more) []  - 0 Hypo / Hyperglycemic Management (close monitor of Blood Glucose) []  - 0 Ankle / Brachial Index (ABI) - do not check if billed separately X- 1 5 Vital Signs Has the patient been seen at the hospital within the last three years: Yes Total Score: 90 Level Of Care: New/Established - Level 3 Electronic Signature(s) Signed: 01/08/2022 4:20:44 PM By: [name] Entered By: [name] on 01/08/2022 15:31:13 [name], [name] ([id number]) -------------------------------------------------------------------------------- Encounter Discharge Information Details Patient Name: Mark Morale L. Date of Service: 01/08/2022 2:30 PM Medical Record Number: [id number] Patient Account Number: [id number] Date of Birth/Sex: [birth year] (81 y.o. M) Treating RN: [name] Primary Care Kyrin Gratz: [name] Other Clinician: Referring [name]: [name] Treating [name]/Extender: [name] in Treatment: 81 Encounter Discharge Information Items Discharge Condition: Stable Ambulatory Status: Walker Discharge Destination: Home Transportation: Ambulance Schedule  Follow-up Appointment: Yes Clinical Summary of Care: Electronic Signature(s) Signed: 01/08/2022 4:20:44 PM By: [name] Entered By: [name] on 01/08/2022 15:33:30 [name], [name] ([id number]) -------------------------------------------------------------------------------- Lower Extremity Assessment Details Patient Name: [name], [name] Date of Service: 01/08/2022 2:30 PM Medical Record Number: [id number] Patient Account Number: [id number] Date of Birth/Sex: 1941/04/14 (81 y.o. M) Treating RN: [name] Primary Care Jadee Golebiewski: [name] Other Clinician: Referring Zoeie Ritter: [name] Treating Elanna Bert/Extender: Jeri Cos Weeks in Treatment: 81 Electronic Signature(s) Signed: 01/08/2022 4:20:44 PM By: [name] Signed: 01/08/2022 4:30:42 PM By: [name] Entered By: [name] on 01/08/2022 14:52:17 Slagter, Arber L. ([id number]) -------------------------------------------------------------------------------- Multi Wound Chart Details Patient Name: [name], [name] Date of Service: 01/08/2022 2:30 PM Medical Record Number: [id number] Patient Account Number: [id number] Date of Birth/Sex: 10/21/1940 (81 y.o. M) Treating RN: [name] Primary Care Ann-Marie Kluge: [name] Other Clinician: Referring Yvon Mccord: [name] Treating Joanne Salah/Extender: [name] in Treatment: 81 Vital Signs Height(in): 68 Pulse(bpm): 76 Weight(lbs): 172 Blood Pressure(mmHg): 112/65 Body Mass Index(BMI): 26.1 Temperature(F): 98.2 Respiratory Rate(breaths/min): 18 Photos: [N/A:N/A] Wound Location: Left, Posterior Lower Leg N/A N/A Wounding Event: Gradually Appeared N/A N/A Primary Etiology: Calciphylaxis N/A N/A Comorbid History: Coronary Artery Disease, Type II N/A N/A Diabetes Date Acquired: 04/05/2021 N/A N/A Weeks of Treatment: 32 N/A N/A Wound Status: Open N/A N/A Wound Recurrence: No N/A N/A Clustered Wound: Yes N/A N/A Clustered  Quantity: 3 N/A N/A Measurements L x W x D (cm) 8x9.2x0.1 N/A N/A Area (cm) : 57.805 N/A N/A Volume (cm) : 5.781 N/A N/A % Reduction in Area: 66.50% N/A N/A % Reduction in Volume: 91.60% N/A N/A Classification: Full Thickness Without Exposed N/A N/A Support Structures Exudate Amount: Medium N/A N/A Exudate Type: Serosanguineous N/A N/A Exudate Color: red, brown N/A N/A Granulation Amount: Large (67-100%) N/A N/A Granulation Quality: Red, Hyper-granulation N/A N/A Necrotic Amount: None Present (0%) N/A N/A Exposed Structures: Fat Layer (Subcutaneous Tissue): N/A N/A  Yes Fascia: No Tendon: No Muscle: No Joint: No Bone: No Epithelialization: None N/A N/A Treatment Notes Electronic Signature(s) Signed: 01/08/2022 4:20:44 PM By: [name] Entered By: [name] on 01/08/2022 15:22:02 Bonnet, [place] ([id number]) Petraglia, Cindy Carlean Jews ([id number]) -------------------------------------------------------------------------------- [place] Details Patient Name: [name], Jaylen L. Date of Service: 01/08/2022 2:30 PM Medical Record Number: [id number] Patient Account Number: [id number] Date of Birth/Sex: 17-Feb-1941 (81 y.o. M) Treating RN: [name] Primary Care Hilbert Briggs: [name] Other Clinician: Referring Mardy Hoppe: [name] Treating Nariya Neumeyer/Extender: [name] in Treatment: 46 Active Inactive Wound/Skin Impairment Nursing Diagnoses: Knowledge deficit related to ulceration/compromised skin integrity Goals: Patient/caregiver will verbalize understanding of skin care regimen Date Initiated: 05/24/2021 Target Resolution Date: 01/08/2022 Goal Status: Active Ulcer/skin breakdown will have a volume reduction of 30% by week 4 Date Initiated: 05/24/2021 Date Inactivated: 08/14/2021 Target Resolution Date: 07/24/2021 Goal Status: Unmet Unmet Reason: comorbites Ulcer/skin breakdown will have a volume reduction of 50% by week 8 Date  Initiated: 05/24/2021 Date Inactivated: 09/25/2021 Target Resolution Date: 08/24/2021 Goal Status: Unmet Unmet Reason: comorbities Ulcer/skin breakdown will have a volume reduction of 80% by week 12 Date Initiated: 05/24/2021 Date Inactivated: 09/25/2021 Target Resolution Date: 09/24/2021 Goal Status: Unmet Unmet Reason: comorbities Ulcer/skin breakdown will heal within 14 weeks Date Initiated: 05/24/2021 Date Inactivated: 10/30/2021 Target Resolution Date: 10/22/2021 Goal Status: Unmet Unmet Reason: comorbities Interventions: Assess patient/caregiver ability to obtain necessary supplies Assess patient/caregiver ability to perform ulcer/skin care regimen upon admission and as needed Assess ulceration(s) every visit Notes: Electronic Signature(s) Signed: 01/08/2022 4:20:44 PM By: [name] Signed: 01/08/2022 4:30:42 PM By: [name] Entered By: [name] on 01/08/2022 14:56:37 [name], [name] ([id number]) -------------------------------------------------------------------------------- Pain Assessment Details Patient Name: [name], [name] Date of Service: 01/08/2022 2:30 PM Medical Record Number: [id number] Patient Account Number: [id number] Date of Birth/Sex: 10/27/1940 (81 y.o. M) Treating RN: [name] Primary Care Rowe Warman: [name] Other Clinician: Referring Maame Dack: [name] Treating Askia Hazelip/Extender: [name] in Treatment: 81 Active Problems Location of Pain Severity and Description of Pain Patient Has Paino No Site Locations Pain Management and Medication Current Pain Management: Electronic Signature(s) Signed: 01/08/2022 4:20:44 PM By: [name] Signed: 01/08/2022 4:30:42 PM By: [name] Entered By: [name] on 01/08/2022 14:48:17 [name], [name] ([id number]) -------------------------------------------------------------------------------- Patient/Caregiver Education Details Patient Name: Mark Morale  L. Date of Service: 01/08/2022 2:30 PM Medical Record Number: [id number] Patient Account Number: [id number] Date of Birth/Gender: [birth year] (81 y.o. M) Treating RN: [name] Primary Care Physician: [name] Other Clinician: Referring Physician: Jenna [name] Treating Physician/Extender: [name] in Treatment: 5 Education Assessment Education Provided To: Patient Education Topics Provided Wound/Skin Impairment: Handouts: Other: continue wound care as directed Methods: Explain/Verbal Responses: State content correctly Electronic Signature(s) Signed: 01/08/2022 4:20:44 PM By: [name] Entered By: [name] on 01/08/2022 15:31:57 [name], [name] ([id number]) -------------------------------------------------------------------------------- Wound Assessment Details Patient Name: [name], [name] Date of Service: 01/08/2022 2:30 PM Medical Record Number: [id number] Patient Account Number: [id number] Date of Birth/Sex: Aug 29, 1940 (81 y.o. M) Treating RN: [name] Primary Care Al Gagen: [name] Other Clinician: Referring Shaydon Lease: [name] Treating Hula Tasso/Extender: [name] in Treatment: 81 Wound Status Wound Number: 1 Primary Etiology: Calciphylaxis Wound Location: Left, Posterior Lower Leg Wound Status: Open Wounding Event: Gradually Appeared Comorbid History: Coronary Artery Disease, Type II Diabetes Date Acquired: 04/05/2021 Weeks Of Treatment: 32 Clustered Wound: Yes Photos Wound Measurements Length: (cm) 8 Width: (cm) 9.2 Depth: (cm) 0.1 Clustered Quantity: 3 Area: (cm) 57.805 Volume: (  cm) 5.781 % Reduction in Area: 66.5% % Reduction in Volume: 91.6% Epithelialization: None Wound Description Classification: Full Thickness Without Exposed Support Structu Exudate Amount: Medium Exudate Type: Serosanguineous Exudate Color: red, brown res Foul Odor After Cleansing: No Slough/Fibrino No Wound  Bed Granulation Amount: Large (67-100%) Exposed Structure Granulation Quality: Red, Hyper-granulation Fascia Exposed: No Necrotic Amount: None Present (0%) Fat Layer (Subcutaneous Tissue) Exposed: Yes Tendon Exposed: No Muscle Exposed: No Joint Exposed: No Bone Exposed: No Treatment Notes Wound #1 (Lower Leg) Wound Laterality: Left, Posterior Cleanser Peri-Wound Care Topical [name], [name] ([id number]) Primary Dressing Gauze Discharge Instruction: As directed: moistened with Dakins Solution Secondary Dressing Kerlix 4.5 x 4.1 (in/yd) Discharge Instruction: Apply Kerlix 4.5 x 4.1 (in/yd) as instructed Secured With Medipore Tape - 58M Medipore H Soft Cloth Surgical Tape, 2x2 (in/yd) Compression Wrap Compression Stockings Add-Ons Electronic Signature(s) Signed: 01/08/2022 4:20:44 PM By: [name] Signed: 01/08/2022 4:30:42 PM By: [name] Entered By: [name] on 01/08/2022 14:50:51 [name], [name] ([id number]) -------------------------------------------------------------------------------- Vitals Details Patient Name: [name], [name] L. Date of Service: 01/08/2022 2:30 PM Medical Record Number: [id number] Patient Account Number: [id number] Date of Birth/Sex: 10-Oct-1940 (81 y.o. M) Treating RN: [name] Primary Care Nema Oatley: [name] Other Clinician: Referring Asher Torpey: [name] Treating Bill Yohn/Extender: [name] in Treatment: 81 Vital Signs Time Taken: 14:47 Temperature (F): 98.2 Height (in): 68 Pulse (bpm): 57 Weight (lbs): 172 Respiratory Rate (breaths/min): 18 Body Mass Index (BMI): 26.1 Blood Pressure (mmHg): 112/65 Reference Range: 80 - 120 mg / dl Electronic Signature(s) Signed: 01/08/2022 4:20:44 PM By: [name] Entered By: [name] on 01/08/2022 14:48:05

## 2022-01-08 NOTE — Progress Notes (Signed)
SOMA, LIZAK ([id number]) Visit Report for 01/08/2022 Chief Complaint Document Details Patient Name: [name], [name]. Date of Service: 01/08/2022 2:30 PM Medical Record Number: [id number] Patient Account Number: [id number] Date of Birth/Sex: 23-Mar-1941 (81 y.o. M) Treating RN: [name] Primary Care Provider: Jenna [name] Other Clinician: Referring Provider: Jenna [name] Treating Provider/Extender: [name] in Treatment: 29 Information Obtained from: Patient Chief Complaint Left LE Calciphylaxis and Glans Penis Calciphylaxis Electronic Signature(s) Signed: 01/08/2022 2:27:00 PM By: Mark Keeler PA-C Entered By: [name] on 01/08/2022 14:26:59 [name], [name] ([id number]) -------------------------------------------------------------------------------- Physician Orders Details Patient Name: Mark Morale L. Date of Service: 01/08/2022 2:30 PM Medical Record Number: [id number] Patient Account Number: [id number] Date of Birth/Sex: Jun 25, 1941 (81 y.o. 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 Edema Control - Lymphedema / Segmental Compressive Device / Other o Elevate, Exercise Daily and Avoid Standing for Long Periods of Time. o Elevate legs to the level of the heart and pump ankles as often  as possible o Elevate leg(s) parallel to the floor when sitting. Wound Treatment Wound #1 - Lower Leg Wound Laterality: Left, Posterior Primary Dressing: Gauze 1 x Per Day/30 Days Discharge Instructions: As directed: moistened with Dakins Solution Secondary Dressing: Kerlix 4.5 x 4.1 (in/yd) (Generic) 1 x Per Day/30 Days Discharge Instructions: Apply Kerlix 4.5 x 4.1 (in/yd) as instructed Secured With: Medipore Tape - 34M Medipore H Soft Cloth Surgical Tape, 2x2 (in/yd) (Generic) 1 x Per Day/30 Days Electronic Signature(s) Signed: 01/08/2022 4:20:44 PM By: [name] Entered By: [name] on 01/08/2022 15:23:49 [name], [name] ([id number]) -------------------------------------------------------------------------------- Problem List Details Patient Name: [name], [name] Date of Service: 01/08/2022 2:30 PM Medical Record Number: [id number] Patient Account Number: [id number] Date of Birth/Sex: 31-Oct-1940 (81 y.o. 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 -------------------------------------------------------------------------------- [place] Details Patient Name: Mark Morale L. Date of Service: 01/08/2022 Medical Record Number: [id number] Patient Account Number: [id number] Date of Birth/Sex: [birth year] (81 y.o. 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

## 2022-01-14 DIAGNOSIS — N2581 Secondary hyperparathyroidism of renal origin: Secondary | ICD-10-CM | POA: Diagnosis not present

## 2022-01-14 DIAGNOSIS — E876 Hypokalemia: Secondary | ICD-10-CM | POA: Diagnosis not present

## 2022-01-14 DIAGNOSIS — D509 Iron deficiency anemia, unspecified: Secondary | ICD-10-CM | POA: Diagnosis not present

## 2022-01-14 DIAGNOSIS — N186 End stage renal disease: Secondary | ICD-10-CM | POA: Diagnosis not present

## 2022-01-14 DIAGNOSIS — Z992 Dependence on renal dialysis: Secondary | ICD-10-CM | POA: Diagnosis not present

## 2022-01-14 DIAGNOSIS — D689 Coagulation defect, unspecified: Secondary | ICD-10-CM | POA: Diagnosis not present

## 2022-01-15 DIAGNOSIS — E1142 Type 2 diabetes mellitus with diabetic polyneuropathy: Secondary | ICD-10-CM | POA: Diagnosis not present

## 2022-01-15 DIAGNOSIS — E1122 Type 2 diabetes mellitus with diabetic chronic kidney disease: Secondary | ICD-10-CM | POA: Diagnosis not present

## 2022-01-15 DIAGNOSIS — Z992 Dependence on renal dialysis: Secondary | ICD-10-CM | POA: Diagnosis not present

## 2022-01-15 DIAGNOSIS — E113293 Type 2 diabetes mellitus with mild nonproliferative diabetic retinopathy without macular edema, bilateral: Secondary | ICD-10-CM | POA: Diagnosis not present

## 2022-01-15 DIAGNOSIS — Z794 Long term (current) use of insulin: Secondary | ICD-10-CM | POA: Diagnosis not present

## 2022-01-15 DIAGNOSIS — I10 Essential (primary) hypertension: Secondary | ICD-10-CM | POA: Diagnosis not present

## 2022-01-15 DIAGNOSIS — N186 End stage renal disease: Secondary | ICD-10-CM | POA: Diagnosis not present

## 2022-01-15 DIAGNOSIS — N2581 Secondary hyperparathyroidism of renal origin: Secondary | ICD-10-CM | POA: Diagnosis not present

## 2022-01-15 DIAGNOSIS — E1159 Type 2 diabetes mellitus with other circulatory complications: Secondary | ICD-10-CM | POA: Diagnosis not present

## 2022-01-21 DIAGNOSIS — E1122 Type 2 diabetes mellitus with diabetic chronic kidney disease: Secondary | ICD-10-CM | POA: Diagnosis not present

## 2022-01-21 DIAGNOSIS — D689 Coagulation defect, unspecified: Secondary | ICD-10-CM | POA: Diagnosis not present

## 2022-01-21 DIAGNOSIS — N186 End stage renal disease: Secondary | ICD-10-CM | POA: Diagnosis not present

## 2022-01-21 DIAGNOSIS — Z992 Dependence on renal dialysis: Secondary | ICD-10-CM | POA: Diagnosis not present

## 2022-01-21 DIAGNOSIS — D509 Iron deficiency anemia, unspecified: Secondary | ICD-10-CM | POA: Diagnosis not present

## 2022-01-21 DIAGNOSIS — N2581 Secondary hyperparathyroidism of renal origin: Secondary | ICD-10-CM | POA: Diagnosis not present

## 2022-01-21 DIAGNOSIS — Z23 Encounter for immunization: Secondary | ICD-10-CM | POA: Diagnosis not present

## 2022-01-21 DIAGNOSIS — R52 Pain, unspecified: Secondary | ICD-10-CM | POA: Diagnosis not present

## 2022-01-22 ENCOUNTER — Encounter: Payer: PPO | Admitting: Student in an Organized Health Care Education/Training Program

## 2022-01-24 ENCOUNTER — Encounter: Payer: PPO | Admitting: Physician Assistant

## 2022-01-24 DIAGNOSIS — L97222 Non-pressure chronic ulcer of left calf with fat layer exposed: Secondary | ICD-10-CM | POA: Diagnosis not present

## 2022-01-24 DIAGNOSIS — L97822 Non-pressure chronic ulcer of other part of left lower leg with fat layer exposed: Secondary | ICD-10-CM | POA: Diagnosis not present

## 2022-01-24 NOTE — Progress Notes (Signed)
NAEL, PETROSYAN ([id number]) Visit Report for 01/24/2022 Arrival Information Details Patient Name: LENN, VOLKER. Date of Service: 01/24/2022 1:15 PM Medical Record Number: [id number] Patient Account Number: [id number] Date of Birth/Sex: April 30, 1941 (81 y.o. M) Treating RN: Alycia Rossetti Primary Care Gerrett Loman: Jenna Luo Other Clinician: Referring Shaymus Eveleth: Jenna Luo Treating Khyler Urda/Extender: Skipper Cliche in Treatment: 83 Visit Information History Since Last Visit Added or deleted any medications: No Patient Arrived: Walker Any new allergies or adverse reactions: No Arrival Time: 13:26 Hospitalized since last visit: No Accompanied By: Wife Has Dressing in Place as Prescribed: Yes Transfer Assistance: None Pain Present Now: No Patient Requires Transmission-Based Precautions: No Patient Has Alerts: No Electronic Signature(s) Unsigned Entered ByAlycia Rossetti on 01/24/2022 13:37:04 Signature(s): Date(s): Paez, Kohner L. ([id number]) -------------------------------------------------------------------------------- Clinic Level of Care Assessment Details Patient Name: Coffield, Kamdin L. Date of Service: 01/24/2022 1:15 PM Medical Record Number: [id number] Patient Account Number: [id number] Date of Birth/Sex: [birth year] (81 y.o. M) Treating RN: Alycia Rossetti Primary Care Raymie Giammarco: Jenna Luo Other Clinician: Referring Charell Faulk: Jenna Luo Treating Velencia Lenart/Extender: Skipper Cliche in Treatment: 35 Clinic Level of Care Assessment Items TOOL 4 Quantity Score []  - Use when only an EandM is performed on FOLLOW-UP visit 0 ASSESSMENTS - Nursing Assessment / Reassessment X - Reassessment of Co-morbidities (includes updates in patient status) 1 10 X- 1 5 Reassessment of Adherence to Treatment Plan ASSESSMENTS - Wound and Skin Assessment / Reassessment []  - Simple Wound Assessment / Reassessment - one wound 0 X- 2 5 Complex Wound Assessment /  Reassessment - multiple wounds []  - 0 Dermatologic / Skin Assessment (not related to wound area) ASSESSMENTS - Focused Assessment []  - Circumferential Edema Measurements - multi extremities 0 []  - 0 Nutritional Assessment / Counseling / Intervention []  - 0 Lower Extremity Assessment (monofilament, tuning fork, pulses) []  - 0 Peripheral Arterial Disease Assessment (using hand held doppler) ASSESSMENTS - Ostomy and/or Continence Assessment and Care []  - Incontinence Assessment and Management 0 []  - 0 Ostomy Care Assessment and Management (repouching, etc.) PROCESS - Coordination of Care X - Simple Patient / Family Education for ongoing care 1 15 []  - 0 Complex (extensive) Patient / Family Education for ongoing care X- 1 10 Staff obtains Consents, Records, Test Results / Process Orders []  - 0 Staff telephones HHA, Nursing Homes / Clarify orders / etc []  - 0 Routine Transfer to another Facility (non-emergent condition) []  - 0 Routine Hospital Admission (non-emergent condition) []  - 0 New Admissions / Biomedical engineer / Ordering NPWT, Apligraf, etc. []  - 0 Emergency Hospital Admission (emergent condition) []  - 0 Simple Discharge Coordination []  - 0 Complex (extensive) Discharge Coordination PROCESS - Special Needs []  - Pediatric / Minor Patient Management 0 []  - 0 Isolation Patient Management []  - 0 Hearing / Language / Visual special needs []  - 0 Assessment of Community assistance (transportation, D/C planning, etc.) []  - 0 Additional assistance / Altered mentation []  - 0 Support Surface(s) Assessment (bed, cushion, seat, etc.) INTERVENTIONS - Wound Cleansing / Measurement Schubach, Anthony L. ([id number]) []  - 0 Simple Wound Cleansing - one wound X- 2 5 Complex Wound Cleansing - multiple wounds X- 1 5 Wound Imaging (photographs - any number of wounds) []  - 0 Wound Tracing (instead of photographs) []  - 0 Simple Wound Measurement - one wound X- 2 5 Complex  Wound Measurement - multiple wounds INTERVENTIONS - Wound Dressings []  - Small Wound Dressing one or multiple wounds 0 X- 2 15 Medium Wound Dressing one or  multiple wounds []  - 0 Large Wound Dressing one or multiple wounds []  - 0 Application of Medications - topical []  - 0 Application of Medications - injection INTERVENTIONS - Miscellaneous []  - External ear exam 0 []  - 0 Specimen Collection (cultures, biopsies, blood, body fluids, etc.) []  - 0 Specimen(s) / Culture(s) sent or taken to Lab for analysis []  - 0 Patient Transfer (multiple staff / Civil Service fast streamer / Similar devices) []  - 0 Simple Staple / Suture removal (25 or less) []  - 0 Complex Staple / Suture removal (26 or more) []  - 0 Hypo / Hyperglycemic Management (close monitor of Blood Glucose) []  - 0 Ankle / Brachial Index (ABI) - do not check if billed separately []  - 0 Vital Signs Has the patient been seen at the hospital within the last three years: Yes Total Score: 105 Level Of Care: New/Established - Level 3 Electronic Signature(s) Unsigned Entered By: Alycia Rossetti on 01/24/2022 14:22:11 Signature(s): Date(s): Enriquez, Hermenegildo L. ([id number]) -------------------------------------------------------------------------------- Encounter Discharge Information Details Patient Name: Carreker, Andrus L. Date of Service: 01/24/2022 1:15 PM Medical Record Number: [id number] Patient Account Number: [id number] Date of Birth/Sex: 1941/03/05 (81 y.o. M) Treating RN: Alycia Rossetti Primary Care Wetzel Meester: Jenna Luo Other Clinician: Referring Chidera Dearcos: Jenna Luo Treating Malgorzata Albert/Extender: Skipper Cliche in Treatment: 24 Encounter Discharge Information Items Discharge Condition: Stable Ambulatory Status: Cane Discharge Destination: Home Transportation: Private Auto Accompanied By: wife Schedule Follow-up Appointment: No Clinical Summary of Care: Electronic Signature(s) Unsigned Entered By: Alycia Rossetti on  01/24/2022 14:36:38 Signature(s): Date(s): ZAKI, GERTSCH L. ([id number]) -------------------------------------------------------------------------------- Lower Extremity Assessment Details Patient Name: Heymann, Jden L. Date of Service: 01/24/2022 1:15 PM Medical Record Number: [id number] Patient Account Number: [id number] Date of Birth/Sex: 1940-09-11 (81 y.o. M) Treating RN: Alycia Rossetti Primary Care Vergene Marland: Jenna Luo Other Clinician: Referring Kyon Bentler: Jenna Luo Treating Dearl Rudden/Extender: Skipper Cliche in Treatment: 35 Electronic Signature(s) Unsigned Entered By: Alycia Rossetti on 01/24/2022 14:00:12 Signature(s): Date(s): Matheson, Lopaka L. ([id number]) -------------------------------------------------------------------------------- Multi Wound Chart Details Patient Name: Fofana, Linn L. Date of Service: 01/24/2022 1:15 PM Medical Record Number: [id number] Patient Account Number: [id number] Date of Birth/Sex: 09/06/1940 (81 y.o. 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 Comorbid History: Coronary Artery Disease, Type II Coronary Artery Disease, Type II N/A Diabetes Diabetes Date Acquired: 04/05/2021 01/20/2022 N/A Weeks of Treatment: 35 0 N/A Wound Status: Open Open N/A Wound Recurrence: No No N/A Clustered Wound: Yes No N/A Clustered Quantity: 3 N/A N/A Measurements L x W x D (cm) 5x7.5x0.1 1.5x1x0.1 N/A Area (cm) :  29.452 1.178 N/A Volume (cm) : 2.945 0.118 N/A % Reduction in Area: 83.00% N/A N/A % Reduction in Volume: 95.70% N/A N/A Classification: Full Thickness Without Exposed Grade 1 N/A Support Structures Exudate Amount: Medium Small N/A Exudate Type: Serosanguineous Sanguinous N/A Exudate Color: red, brown red N/A Granulation Amount: Large (67-100%) Small (1-33%) N/A Granulation Quality: Red, Hyper-granulation Red N/A Necrotic Amount: None Present (0%) None Present (0%) N/A Exposed Structures: Fat Layer (Subcutaneous Tissue): Fat Layer (Subcutaneous Tissue): N/A Yes Yes Fascia: No Fascia: No Tendon: No Tendon: No Muscle: No Joint: No Joint: No Bone: No  Bone: No Epithelialization: None Small (1-33%) N/A Treatment Notes Wound #1 (Lower Leg) Wound Laterality: Left, Posterior Cleanser Peri-Wound Care GORAN, OLDEN ([id number]) Topical Primary Dressing Gauze Discharge Instruction: As directed: moistened with Dakins Solution Secondary Dressing Kerlix 4.5 x 4.1 (in/yd) Discharge Instruction: Apply Kerlix 4.5 x 4.1 (in/yd) as instructed Secured With Medipore Tape - 8M Medipore H Soft Cloth Surgical Tape, 2x2 (in/yd) Compression Wrap Compression Stockings Add-Ons Wound #3 (Lower Leg) Wound Laterality: Left, Anterior Cleanser Peri-Wound Care Topical Primary Dressing Gauze Discharge Instruction: As directed: moistened with Dakins Solution Secondary Dressing Kerlix 4.5 x 4.1 (in/yd) Discharge Instruction: Apply Kerlix 4.5 x 4.1 (in/yd) as instructed Secured With Medipore Tape - 8M Medipore H Soft Cloth Surgical Tape, 2x2 (in/yd) Compression Wrap Compression Stockings Add-Ons Electronic Signature(s) Unsigned Entered By: Alycia Rossetti on 01/24/2022 14:36:11 Signature(s): Date(s): Caraher, [name] ([id number]) -------------------------------------------------------------------------------- [place] Details Patient Name: Ergle, Zaelyn L. Date  of Service: 01/24/2022 1:15 PM Medical Record Number: [id number] Patient Account Number: [id number] Date of Birth/Sex: August 29, 1940 (81 y.o. M) Treating RN: Alycia Rossetti Primary Care Teiana Hajduk: Jenna Luo Other Clinician: Referring Kasmira Cacioppo: Jenna Luo Treating Denard Tuminello/Extender: Skipper Cliche in Treatment: 35 Active Inactive Wound/Skin Impairment Nursing Diagnoses: Knowledge deficit related to ulceration/compromised skin integrity Goals: Patient/caregiver will verbalize understanding of skin care regimen Date Initiated: 05/24/2021 Target Resolution Date: 01/08/2022 Goal Status: Active Ulcer/skin breakdown will have a volume reduction of 30% by week 4 Date Initiated: 05/24/2021 Date Inactivated: 08/14/2021 Target Resolution Date: 07/24/2021 Goal Status: Unmet Unmet Reason: comorbites Ulcer/skin breakdown will have a volume reduction of 50% by week 8 Date Initiated: 05/24/2021 Date Inactivated: 09/25/2021 Target Resolution Date: 08/24/2021 Goal Status: Unmet Unmet Reason: comorbities Ulcer/skin breakdown will have a volume reduction of 80% by week 12 Date Initiated: 05/24/2021 Date Inactivated: 09/25/2021 Target Resolution Date: 09/24/2021 Goal Status: Unmet Unmet Reason: comorbities Ulcer/skin breakdown will heal within 14 weeks Date Initiated: 05/24/2021 Date Inactivated: 10/30/2021 Target Resolution Date: 10/22/2021 Goal Status: Unmet Unmet Reason: comorbities Interventions: Assess patient/caregiver ability to obtain necessary supplies Assess patient/caregiver ability to perform ulcer/skin care regimen upon admission and as needed Assess ulceration(s) every visit Notes: Electronic Signature(s) Unsigned Entered By: Alycia Rossetti on 01/24/2022 14:35:48 Signature(s): Date(s): Puig, Trevionne L. ([id number]) -------------------------------------------------------------------------------- Pain Assessment Details Patient Name: Waybright, Jimi L. Date of Service:  01/24/2022 1:15 PM Medical Record Number: [id number] Patient Account Number: [id number] Date of Birth/Sex: 1940/12/16 (81 y.o. M) Treating RN: Alycia Rossetti Primary Care Mclean Moya: Jenna Luo Other Clinician: Referring Nashira Mcglynn: Jenna Luo Treating Pritika Alvarez/Extender: Skipper Cliche in Treatment: 68 Active Problems Location of Pain Severity and Description of Pain Patient Has Paino No Site Locations Rate the pain. Current Pain Level: 0 Pain Management and Medication Current Pain Management: Electronic Signature(s) Unsigned Entered By: Alycia Rossetti on 01/24/2022 13:38:14 Signature(s): Date(s): CAS, TRACZ ([id number]) -------------------------------------------------------------------------------- Patient/Caregiver Education Details Patient Name: Baez, Brion L. Date of Service: 01/24/2022 1:15 PM Medical Record Number: [id number] Patient Account Number: [id number] Date of Birth/Gender: [birth year] (81 y.o. M) Treating RN: Alycia Rossetti Primary Care Physician: Jenna Luo Other Clinician: Referring Physician: Jenna Luo Treating Physician/Extender: Skipper Cliche in Treatment: 75 Education Assessment Education Provided To: Patient Education Topics Provided Wound/Skin Impairment: Handouts: Caring for Your Ulcer Methods: Demonstration, Explain/Verbal Responses: State content correctly Electronic Signature(s) Unsigned Entered By: Alycia Rossetti on 01/24/2022 14:22:35 Signature(s): Date(s): Passow, Agapito L. ([id number]) -------------------------------------------------------------------------------- Wound Assessment Details Patient Name: Bensman, Axel L. Date of Service: 01/24/2022 1:15 PM Medical Record Number: [id number] Patient Account Number: [id number] Date of  Birth/Sex: 30-Dec-1940 (81 y.o. M) Treating RN: Alycia Rossetti Primary Care Armilda Vanderlinden: Jenna Luo Other Clinician: Referring Srikar Chiang: Jenna Luo Treating Nate Common/Extender:  Skipper Cliche in Treatment: 35 Wound Status Wound Number: 1 Primary Etiology: Calciphylaxis Wound Location: Left, Posterior Lower Leg Wound Status: Open Wounding Event: Gradually Appeared Comorbid History: Coronary Artery Disease, Type II Diabetes Date Acquired: 04/05/2021 Weeks Of Treatment: 35 Clustered Wound: Yes Photos Wound Measurements Length: (cm) 5 Width: (cm) 7.5 Depth: (cm) 0.1 Clustered Quantity: 3 Area: (cm) 29.452 Volume: (cm) 2.945 % Reduction in Area: 83% % Reduction in Volume: 95.7% Epithelialization: None Wound Description Classification: Full Thickness Without Exposed Support Structu Exudate Amount: Medium Exudate Type: Serosanguineous Exudate Color: red, brown res Foul Odor After Cleansing: No Slough/Fibrino No Wound Bed Granulation Amount: Large (67-100%) Exposed Structure Granulation Quality: Red, Hyper-granulation Fascia Exposed: No Necrotic Amount: None Present (0%) Fat Layer (Subcutaneous Tissue) Exposed: Yes Tendon Exposed: No Muscle Exposed: No Joint Exposed: No Bone Exposed: No Treatment Notes Wound #1 (Lower Leg) Wound Laterality: Left, Posterior Cleanser Peri-Wound Care Topical Tropea, Kem L. ([id number]) Primary Dressing Gauze Discharge Instruction: As directed: moistened with Dakins Solution Secondary Dressing Kerlix 4.5 x 4.1 (in/yd) Discharge Instruction: Apply Kerlix 4.5 x 4.1 (in/yd) as instructed Secured With Medipore Tape - 28M Medipore H Soft Cloth Surgical Tape, 2x2 (in/yd) Compression Wrap Compression Stockings Add-Ons Electronic Signature(s) Unsigned Entered By: Alycia Rossetti on 01/24/2022 13:59:57 Signature(s): Date(s): Akre, Savior L. ([id number]) -------------------------------------------------------------------------------- Wound Assessment Details Patient Name: Fong, Kamali L. Date of Service: 01/24/2022 1:15 PM Medical Record Number: [id number] Patient Account Number: [id number] Date of  Birth/Sex: 16-Aug-1940 (81 y.o. M) Treating RN: Alycia Rossetti Primary Care Matti Minney: Jenna Luo Other Clinician: Referring Alexanderia Gorby: Jenna Luo Treating Jonell Brumbaugh/Extender: Skipper Cliche in Treatment: 35 Wound Status Wound Number: 3 Primary Etiology: Diabetic Wound/Ulcer of the Lower Extremity Wound Location: Left, Anterior Lower Leg Wound Status: Open Wounding Event: Not Known Comorbid History: Coronary Artery Disease, Type II Diabetes Date Acquired: 01/20/2022 Weeks Of Treatment: 0 Clustered Wound: No Photos Wound Measurements Length: (cm) 1.5 % Red Width: (cm) 1 % Red Depth: (cm) 0.1 Epith Area: (cm) 1.178 Tunn Volume: (cm) 0.118 Unde uction in Area: uction in Volume: elialization: Small (1-33%) eling: No rmining: No Wound Description Classification: Grade 1 Foul Exudate Amount: Small Sloug Exudate Type: Sanguinous Exudate Color: red Odor After Cleansing: No h/Fibrino No Wound Bed Granulation Amount: Small (1-33%) Exposed Structure Granulation Quality: Red Fascia Exposed: No Necrotic Amount: None Present (0%) Fat Layer (Subcutaneous Tissue) Exposed: Yes Tendon Exposed: No Joint Exposed: No Bone Exposed: No Treatment Notes Wound #3 (Lower Leg) Wound Laterality: Left, Anterior Cleanser Peri-Wound Care Topical Primary Dressing Gauze Cristina, Raequon L. ([id number]) Discharge Instruction: As directed: moistened with Dakins Solution Secondary Dressing Kerlix 4.5 x 4.1 (in/yd) Discharge Instruction: Apply Kerlix 4.5 x 4.1 (in/yd) as instructed Secured With Medipore Tape - 28M Medipore H Soft Cloth Surgical Tape, 2x2 (in/yd) Compression Wrap Compression Stockings Add-Ons Electronic Signature(s) Unsigned Entered By: Alycia Rossetti on 01/24/2022 13:51:35 Signature(s): Date(s): Bartholomew, Emily L. ([id number]) -------------------------------------------------------------------------------- Vitals Details Patient Name: Parks, Jovaun L. Date of  Service: 01/24/2022 1:15 PM Medical Record Number: [id number] Patient Account Number: [id number] Date of Birth/Sex: 24-Jan-1941 (81 y.o. M) Treating RN: Alycia Rossetti Primary Care Myeisha Kruser: Jenna Luo Other Clinician: Referring Deaken Jurgens: Jenna Luo Treating Raydell Maners/Extender: Skipper Cliche in Treatment: 35 Vital Signs Time Taken: 13:36 Temperature (F): 97.5 Height (in): 68 Pulse (bpm): 60 Weight (lbs): 172 Respiratory Rate (breaths/min): 16 Body Mass Index (BMI): 26.1 Blood  Pressure (mmHg): 126/72 Reference Range: 80 - 120 mg / dl Electronic Signature(s) Unsigned Entered By: Alycia Rossetti on 01/24/2022 13:38:03 Signature(s): Date(s):

## 2022-01-24 NOTE — Progress Notes (Signed)
GABRIELL, CASIMIR ([id number]) Visit Report for 01/24/2022 Chief Complaint Document Details Patient Name: [name], [name]. Date of Service: 01/24/2022 1:15 PM Medical Record Number: [id number] Patient Account Number: [id number] Date of Birth/Sex: 1941/01/28 (81 y.o. M) Treating RN: Alycia Rossetti Primary Care Provider: Jenna Luo Other Clinician: Referring Provider: Jenna Luo Treating Provider/Extender: Skipper Cliche in Treatment: 59 Information Obtained from: Patient Chief Complaint Left LE Calciphylaxis and Glans Penis Calciphylaxis Electronic Signature(s) Unsigned Previous Signature: 01/24/2022 1:11:36 PM Version By: Worthy Keeler PA-C Entered By: Alycia Rossetti on 01/24/2022 14:22:56 Signature(s): Wilmes, [name] ([id number]) Date(s): -------------------------------------------------------------------------------- HPI Details Patient Name: Mark Morale L. Date of Service: 01/24/2022 1:15 PM Medical Record Number: [id number] Patient Account Number: [id number] Date of Birth/Sex: [birth year] (81 y.o. 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 This has been present for a couple of months currently he is on the sodium thiosulfate at dialysis. He tells me that they picked up on this quickly and have been managing it but he really has not had any help with anything else from the standpoint of progressing the wound itself. The patient does have again end-stage renal disease which currently is listed as stage IV but he is on renal dialysis. He also has coronary artery disease as well as going shortly for  formal arterial studies although he did have a quick test of the tibial artery with his vascular doctor when he went in to check on his fistula for his arm and subsequently they did not feel like he had any hemodynamically unstable flow into the lower extremity this is good news. Nonetheless I think that the biggest issue here is going to be getting some of the eschar off so that we get the wounds med headed in the appropriate direction. 05/31/2021 upon evaluation today patient appears to be doing well with regard to his leg ulceration with calciphylaxis. I am very pleased in this regard. Fortunately there does not appear to be any signs of active infection at this time which is great news. No fevers, chills, nausea, vomiting, or diarrhea. 06/07/2021 upon evaluation today patient appears to be doing well with regard to his legs. Unfortunately he is having an issue here with his penis as well he did mention at the end of last visit. I suggested that he go see urology. However when he went to see the urologist they basically told him there was not anything they could do. They recommended that we needed to be the ones to take care of this. With that being said this is an unusual area that to be honest a lot of the traditional wound care products we use are not really good to be good for this region. Potentially Medihoney alginate could be a possibility also think Santyl could be a possibility. With that being said I think initially my suggestion is probably can be for [place] to attempt the Santyl to see if that will benefit the patient. 06/21/2021 upon evaluation today patient's wound bed actually showed signs of good granulation and epithelization at this point. Fortunately I do not see any signs of active infection systemically which is great news and his legs are doing awesome I think you are making great  progress. The Santyl on the glans penis also has been of benefit this is looking tremendously better.  Again the patient is pleased he also tells me the pain is significantly improved. Overall I think we are headed in the appropriate direction here. 07/05/2021 upon evaluation today patient appears to be doing well with regard to his wounds. Both the area on the tip of his penis as well as the left lateral leg is doing well. There is some debridement this can be needed over the leg region. Fortunately I think that overall the Dakin's moistened gauze has done a great job here. No fevers, chills, nausea, vomiting, or diarrhea. 07/12/2021 upon evaluation today patient presents for reevaluation here in the clinic and overall I think that his wounds are doing significantly better which is great news. This includes both the left leg as well as the glans of the penis. Both are showing signs of significant improvement which is great and overall I think that we are headed in the right direction. I do not see any evidence of active infection at this moment. 07/19/2021 upon evaluation today patient appears to be doing better in regard to his wounds. Were getting much closer to complete resolution in regard to the necrotic tissue being removed from the leg. Overall I am extremely pleased with where we stand today. Overall I do not see any signs of active infection at this time. With regard to the patient's penis this area is showing signs of improvement as well and I am very pleased with that. 07/26/2021 upon evaluation today patient appears to be doing well with regard to his wounds. The leg in fact is looking quite well and I am very pleased with where we stand today. I do not see any signs of infection which is great and there is a lot of new granulation growth also also. With regard to the penis area this also showed signs of excellent improvement which is great news. 08/14/2020 upon evaluation today patient appears to be doing well with regard to his wounds both the leg as well as the glans penis region.  Both are showing signs of improvement which is great news and overall I am extremely pleased with where we stand at this point. I think that the Dakin's moistened gauze dressing for her leg is doing great and the Santyl is definitely helping with the glans penis location. 08/28/2021 upon evaluation today patient appears to be doing well with regard to his wounds on the left leg as well as the penis. Fortunately both are showing signs of improvement. I am going to perform some debridement in regard to the leg there is a small area which is having trouble getting completely clean my work on that a little bit more today. 09/04/2021 upon evaluation today patient appears to be doing pretty well in regard to his wounds. 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 Again he has I just found out today been off of the sodium thiosulfate for about 2 to 3 weeks. He tells me that the initial "3 weeks" was stated to be up by the nurse. With that being said I think he probably is going to need it for a bit longer to be honest. We discussed that today. Over the penis area the Santyl does seem to be loose and a lot of this up and that is good  hopefully will be able to get this completely cleared off it sometime shortly. 09/13/2021 upon evaluation today patient appears to be doing well with regard to his wound. He has been tolerating the dressing changes without complication. Fortunately there does not appear to be any signs of active infection locally or systemically which is great news. Overall I think that he is making progress with regard to his wounds in general. 09/25/2021 upon evaluation today patient appears to be doing excellent in regard to his wounds. I am actually extremely pleased with where we stand today and I think he is making great progress. The patient likewise is happy to hear this. 10/09/2021 upon  evaluation today patient actually appears to be doing excellent in regard to his wound. He has been tolerating the dressing changes without complication. Fortunately I do not see any evidence of active infection locally or systemically which is great news. No fevers, Daudelin, Sahid L. ([id number]) chills, nausea, vomiting, or diarrhea. 3/14; patient presents for follow-up. He has no issues or complaints today. 10/23/2021 upon evaluation today patient appears to be doing well with regard to his wounds. In fact the penis appears to be pretty much healed there may be a point when opening but not much at all to be perfectly honest. In regard to the leg I Flygt were definitely seeing signs of significant improvement which is great news. 10/30/2021 upon evaluation today patient appears to be doing well with regard to his wounds. In fact the penis area is completely healed which is great news that just the leg that were dealing with and this is significantly smaller. I am actually extremely pleased with where we stand. 11-06-2021 upon evaluation today patient appears to be doing well with regard to his leg ulcers. All are measuring smaller and looking great. I do not see any signs of infection which is great news. No fever chills noted 11-13-2021 upon evaluation today patient appears to be doing well with regard to his wounds everything is still measuring smaller and looking excellent. I am extremely pleased with where we stand today. There does not appear to be any signs of active infection at this time. No fevers, chills, nausea, vomiting, or diarrhea. 11-20-2021 upon evaluation today patient actually appears to be doing quite well in regard to his wounds. Has been tolerating the dressing changes without complication. Fortunately I do not see any evidence of active infection locally or systemically at this time which is great news. No fevers, chills, nausea, vomiting, or diarrhea. I think were definitely  headed in the right direction here. 12-04-2021 upon evaluation today patient appears to be doing well with regard to his wounds. Overall things seem to be measuring smaller and doing much better. Fortunately there does not appear to be any evidence of active infection locally nor systemically which is great news. No fevers, chills, nausea, vomiting, or diarrhea. 12/11/2021 upon evaluation today patient actually appears to be doing great in regard to his wounds he has been tolerating the dressing changes without complication. Fortunately there does not appear to be any evidence of active infection locally or systemically which is great news. No fevers, chills, nausea, vomiting, or diarrhea. 12-25-2021 upon evaluation today patient appears to be doing excellent in regard to his leg ulcers he has been tolerating the dressing changes and fortunately I do not see any signs of active infection locally or systemically at this time which is great news. Overall I do believe that we are headed in the appropriate direction  here. I do not see any evidence that this is worsening in general which is great news. 01-08-2022 upon evaluation today patient's leg is almost completely closed on the lateral aspect of the posterior is doing awesome. I do not see any evidence of infection and overall I think he is really making excellent progress here. Overall I think we are on the right track. 01-24-2022 upon evaluation today patient's wound is actually showing signs of excellent improvement he did have 1 small area that reopened it looks as if he may have bumped this on something I am not 100% sure. With that being said he overall seems to be doing quite well and I am very pleased with where things stand currently I see no signs of active infection at this time which is great news. Electronic Signature(s) Signed: 01/24/2022 2:33:02 PM By: Worthy Keeler PA-C Entered By: Worthy Keeler on 01/24/2022 14:33:02 [name], [name]  ([id number]) -------------------------------------------------------------------------------- Physician Orders Details Patient Name: Mark Morale L. Date of Service: 01/24/2022 1:15 PM Medical Record Number: [id number] Patient Account Number: [id number] Date of Birth/Sex: January 05, 1941 (80 y.o. M) Treating RN: Alycia Rossetti Primary Care Provider: Jenna Luo Other Clinician: Referring Provider: Jenna Luo Treating Provider/Extender: Skipper Cliche in Treatment: 47 Verbal / Phone Orders: No Diagnosis Coding ICD-10 Coding Code Description E83.50 Unspecified disorder of calcium metabolism L97.822 Non-pressure chronic ulcer of other part of left lower leg with fat layer exposed L98.492 Non-pressure chronic ulcer of skin of other sites with fat layer exposed Z99.2 Dependence on renal dialysis N18.4 Chronic kidney disease, stage 4 (severe) I25.10 Atherosclerotic heart disease of native coronary artery without angina pectoris Follow-up Appointments o Return Appointment in 1 week. Edema Control - Lymphedema / Segmental Compressive Device / Other o Elevate, Exercise Daily and Avoid Standing for Long Periods of Time. o Elevate legs to the level of the heart and pump ankles as often as possible o Elevate leg(s) parallel to the floor when sitting. Wound Treatment Wound #1 - Lower Leg Wound Laterality: Left, Posterior Primary Dressing: Gauze 1 x Per Day/30 Days Discharge Instructions: As directed: moistened with Dakins Solution Secondary Dressing: Kerlix 4.5 x 4.1 (in/yd) (Generic) 1 x Per Day/30 Days Discharge Instructions: Apply Kerlix 4.5 x 4.1 (in/yd) as instructed Secured With: Medipore Tape - 8M Medipore H Soft Cloth Surgical Tape, 2x2 (in/yd) (Generic) 1 x Per Day/30 Days Wound #3 - Lower Leg Wound Laterality: Left, Anterior Primary Dressing: Gauze 1 x Per Day/30 Days Discharge Instructions: As directed: moistened with Dakins Solution Secondary Dressing: Kerlix 4.5 x  4.1 (in/yd) (Generic) 1 x Per Day/30 Days Discharge Instructions: Apply Kerlix 4.5 x 4.1 (in/yd) as instructed Secured With: Medipore Tape - 8M Medipore H Soft Cloth Surgical Tape, 2x2 (in/yd) (Generic) 1 x Per Day/30 Days Electronic Signature(s) Unsigned Entered By: Alycia Rossetti on 01/24/2022 14:35:43 Signature(s): Date(s): [name], [name] ([id number]) -------------------------------------------------------------------------------- Problem List Details Patient Name: [name], [name] Date of Service: 01/24/2022 1:15 PM Medical Record Number: [id number] Patient Account Number: [id number] Date of Birth/Sex: 1940/11/01 (81 y.o. M) Treating RN: Alycia Rossetti Primary Care Provider: Jenna Luo Other Clinician: Referring Provider: Jenna Luo Treating Provider/Extender: Skipper Cliche in Treatment: 67 Active Problems ICD-10 Encounter Code Description Active Date MDM Diagnosis E83.50 Unspecified disorder of calcium metabolism 05/24/2021 No Yes L97.822 Non-pressure chronic ulcer of other part of left lower leg with fat layer 05/24/2021 No Yes exposed L98.492 Non-pressure chronic ulcer of skin of other sites with fat layer exposed 06/07/2021 No Yes Z99.2  Dependence on renal dialysis 05/24/2021 No Yes N18.4 Chronic kidney disease, stage 4 (severe) 05/24/2021 No Yes I25.10 Atherosclerotic heart disease of native coronary artery without angina 05/24/2021 No Yes pectoris Inactive Problems Resolved Problems Electronic Signature(s) Unsigned Previous Signature: 01/24/2022 1:11:27 PM Version By: Worthy Keeler PA-C Entered By: Alycia Rossetti on 01/24/2022 14:22:48 Signature(s): [name], [name] ([id number]) Date(s): -------------------------------------------------------------------------------- SuperBill Details Patient Name: Mark Morale L. Date of Service: 01/24/2022 Medical Record Number: [id number] Patient Account Number: [id number] Date of Birth/Sex: 1940-08-06 (81 y.o.  M) Treating RN: Alycia Rossetti Primary Care Provider: Jenna Luo Other Clinician: Referring Provider: Jenna Luo Treating Provider/Extender: Skipper Cliche in Treatment: 35 Diagnosis Coding ICD-10 Codes Code Description E83.50 Unspecified disorder of calcium metabolism L97.822 Non-pressure chronic ulcer of other part of left lower leg with fat layer exposed L98.492 Non-pressure chronic ulcer of skin of other sites with fat layer exposed Z99.2 Dependence on renal dialysis N18.4 Chronic kidney disease, stage 4 (severe) I25.10 Atherosclerotic heart disease of native coronary artery without angina pectoris Facility Procedures CPT4 Code: [id number] Description: 99213 - WOUND CARE VISIT-LEV 3 EST PT Modifier: Quantity: 1 Electronic Signature(s) Unsigned Entered ByAlycia Rossetti on 01/24/2022 14:22:17 Signature(s): Date(s):

## 2022-01-28 DIAGNOSIS — N2581 Secondary hyperparathyroidism of renal origin: Secondary | ICD-10-CM | POA: Diagnosis not present

## 2022-01-28 DIAGNOSIS — D689 Coagulation defect, unspecified: Secondary | ICD-10-CM | POA: Diagnosis not present

## 2022-01-28 DIAGNOSIS — A419 Sepsis, unspecified organism: Secondary | ICD-10-CM | POA: Diagnosis not present

## 2022-01-28 DIAGNOSIS — D509 Iron deficiency anemia, unspecified: Secondary | ICD-10-CM | POA: Diagnosis not present

## 2022-01-28 DIAGNOSIS — N186 End stage renal disease: Secondary | ICD-10-CM | POA: Diagnosis not present

## 2022-01-28 DIAGNOSIS — E876 Hypokalemia: Secondary | ICD-10-CM | POA: Diagnosis not present

## 2022-01-28 DIAGNOSIS — G9341 Metabolic encephalopathy: Secondary | ICD-10-CM | POA: Diagnosis not present

## 2022-01-28 DIAGNOSIS — N189 Chronic kidney disease, unspecified: Secondary | ICD-10-CM | POA: Diagnosis not present

## 2022-01-28 DIAGNOSIS — Z992 Dependence on renal dialysis: Secondary | ICD-10-CM | POA: Diagnosis not present

## 2022-01-31 ENCOUNTER — Ambulatory Visit
Payer: PPO | Attending: Student in an Organized Health Care Education/Training Program | Admitting: Student in an Organized Health Care Education/Training Program

## 2022-01-31 ENCOUNTER — Encounter: Payer: Self-pay | Admitting: Student in an Organized Health Care Education/Training Program

## 2022-01-31 VITALS — BP 145/74 | HR 77 | Temp 97.6°F | Resp 18 | Ht 68.0 in | Wt 169.0 lb

## 2022-01-31 DIAGNOSIS — L97923 Non-pressure chronic ulcer of unspecified part of left lower leg with necrosis of muscle: Secondary | ICD-10-CM | POA: Diagnosis not present

## 2022-01-31 DIAGNOSIS — Z79891 Long term (current) use of opiate analgesic: Secondary | ICD-10-CM | POA: Diagnosis not present

## 2022-01-31 DIAGNOSIS — Z794 Long term (current) use of insulin: Secondary | ICD-10-CM | POA: Insufficient documentation

## 2022-01-31 DIAGNOSIS — Z0289 Encounter for other administrative examinations: Secondary | ICD-10-CM | POA: Insufficient documentation

## 2022-01-31 DIAGNOSIS — E1142 Type 2 diabetes mellitus with diabetic polyneuropathy: Secondary | ICD-10-CM | POA: Diagnosis not present

## 2022-01-31 DIAGNOSIS — N186 End stage renal disease: Secondary | ICD-10-CM | POA: Diagnosis not present

## 2022-01-31 DIAGNOSIS — G894 Chronic pain syndrome: Secondary | ICD-10-CM | POA: Insufficient documentation

## 2022-01-31 MED ORDER — HYDROCODONE-ACETAMINOPHEN 10-325 MG PO TABS: 1.0000 | ORAL_TABLET | Freq: Four times a day (QID) | ORAL | 0 refills | Status: AC | PRN

## 2022-01-31 MED ORDER — HYDROCODONE-ACETAMINOPHEN 10-325 MG PO TABS: 1.0000 | ORAL_TABLET | Freq: Four times a day (QID) | ORAL | 0 refills | Status: DC | PRN

## 2022-01-31 NOTE — Progress Notes (Signed)
PROVIDER NOTE: Information contained herein reflects review and annotations entered in association with encounter. Interpretation of such information and data should be left to medically-trained personnel. Information provided to patient can be located elsewhere in the medical record under "Patient Instructions". Document created using STT-dictation technology, any transcriptional errors that may result from process are unintentional.    Patient: Mark Jumbo Sr.  Service Category: E/M  Provider: [name], MD  DOB: 23-Nov-1940  DOS: 01/31/2022  Specialty: Interventional Pain Management  MRN: [id number]  Setting: Ambulatory outpatient  PCP: [name], MD  Type: Established Patient    Referring Provider: [name], MD  Location: Office  Delivery: Face-to-face     HPI  [name], a 81 y.o. year old male, is here today because of his Chronic pain syndrome [G89.4]. [name] primary complain today is Leg Pain (left) and Back Pain (low) Last encounter: My last encounter with him was on 11/15/2021 Pertinent problems: [name] has End stage renal disease ([place]); Pain management contract signed; Calciphylaxis of left lower extremity with nonhealing ulcer with necrosis of muscle ([place]); and Chronic pain syndrome on their pertinent problem list. Pain Assessment: Severity of Chronic pain is reported as a 7 /10. Location: Leg Left/denies. Onset: More than a month ago. Quality: Aching. Timing: Constant. Modifying factor(s): meds. Vitals:  height is 5' 8"  (1.727 m) and weight is 169 lb (76.7 kg). His temperature is 97.6 F (36.4 C). His blood pressure is 145/74 (abnormal) and his pulse is 77. His respiration is 18 and oxygen saturation is 99%.   Reason for encounter: medication management.    [name] presents today for medication management.  He is endorsing  analgesic benefit on his current regimen of hydrocodone 10 mg every 6 hours as needed.  He continues to work with  wound care for his left leg.  He states that he is more functional and able to complete ADLs more comfortably.  Pharmacotherapy Assessment  Analgesic: Hydrocodone 10 mg every 6 hours as needed, #120/month MME= 40.}   Monitoring: [place] PMP: PDMP reviewed during this encounter.       Pharmacotherapy: No side-effects or adverse reactions reported. Compliance: No problems identified. Effectiveness: Clinically acceptable.  Mark Shorter, RN  01/31/2022  1:28 PM  Sign when Signing Visit Nursing Pain Medication Assessment:  Safety precautions to be maintained throughout the outpatient stay will include: orient to surroundings, keep bed in low position, maintain call bell within reach at all times, provide assistance with transfer out of bed and ambulation.  Medication Inspection Compliance: Pill count conducted under aseptic conditions, in front of the patient. Neither the pills nor the bottle was removed from the patient's sight at any time. Once count was completed pills were immediately returned to the patient in their original bottle.  Medication: Hydrocodone/APAP Pill/Patch Count:  22 of 120 pills remain Pill/Patch Appearance: Markings consistent with prescribed medication Bottle Appearance: Standard pharmacy container. Clearly labeled. Filled Date: 06 / 05 / 2023 Last Medication intake:  Today   UDS:  Summary  Date Value Ref Range Status  09/06/2021 Note  Final    Comment:    ==================================================================== [employer] [facility] (MW) ==================================================================== Test                             Result       Flag       Units  Drug Present and Declared for Prescription  Verification   Hydrocodone                    >94174       EXPECTED   ng/mg creat   Hydromorphone                  1019         EXPECTED   ng/mg creat   Dihydrocodeine                 1089         EXPECTED   ng/mg creat   Norhydrocodone                  7886         EXPECTED   ng/mg creat    Sources of hydrocodone include scheduled prescription medications.    Hydromorphone, dihydrocodeine and norhydrocodone are expected    metabolites of hydrocodone. Hydromorphone and dihydrocodeine are    also available as scheduled prescription medications.  Drug Present not Declared for Prescription Verification   Desmethyldiazepam              40           UNEXPECTED ng/mg creat   Oxazepam                       194          UNEXPECTED ng/mg creat    Desmethyldiazepam and oxazepam are benzodiazepine drugs, but may    also be present as common metabolites of other benzodiazepine drugs,    including diazepam, chlordiazepoxide, prazepam, clorazepate, and    halazepam.  ==================================================================== Test                      Result    Flag   Units      Ref Range   Creatinine              63               mg/dL      >=20 ==================================================================== Declared Medications:  The flagging and interpretation on this report are based on the  following declared medications.  Unexpected results may arise from  inaccuracies in the declared medications.   **Note: The testing scope of this panel includes these medications:   Hydrocodone (Norco)   **Note: The testing scope of this panel does not include the  following reported medications:   Acetaminophen (Norco)  Aspirin  Calcitriol  Diltiazem  Insulin (NovoLog)  Metoprolol  Pancrelipase  Pravastatin  Sevelamer (Renvela)  Sodium Bicarbonate  Topical ==================================================================== For clinical consultation, please call [phone]. ====================================================================      ROS  Constitutional: Denies any fever or chills Gastrointestinal: No reported hemesis, hematochezia, vomiting, or acute GI distress Musculoskeletal:  Left leg  pain Neurological: No reported episodes of acute onset apraxia, aphasia, dysarthria, agnosia, amnesia, paralysis, loss of coordination, or loss of consciousness  Medication Review  DIALYVITE 800 WITH ZINC, HYDROcodone-acetaminophen, aspirin EC, cinacalcet, diltiazem, insulin aspart, insulin glargine, lidocaine-prilocaine, lipase/protease/amylase, metoprolol tartrate, pravastatin, and sevelamer carbonate  History Review  Allergy: [name] is allergic to enalapril maleate. Drug: [name]  reports no history of drug use. Alcohol:  reports no history of alcohol use. Tobacco:  reports that he has never smoked. He has never used smokeless tobacco. Social: [name]  reports that he has never smoked. He has never used smokeless tobacco. He reports  that he does not drink alcohol and does not use drugs. Medical:  has a past medical history of Arthritis, Chronic back pain, Chronic kidney disease, Coronary atherosclerosis of native coronary artery, Deafness in right ear, Diabetes mellitus, Diabetic retinopathy ([place]), Diastolic dysfunction (02/622), Essential hypertension, benign, Heart murmur, HOH (hard of hearing), Hyperkalemia, Kidney stones, Mixed hyperlipidemia, NAFLD (nonalcoholic fatty liver disease), Pancreatic insufficiency, Prostate cancer ([place]) (2011), Shortness of breath dyspnea, Subdural hematoma ([place]), Type 2 diabetes mellitus ([place]), and Wears hearing aid. Surgical: [name]  has a past surgical history that includes Prostatectomy (2011); Hernia repair; Appendectomy; Cholecystectomy; Bilateral hip replacement; Cataract extraction w/PHACO (Left, 05/15/2015); Cataract extraction w/PHACO (Right, 08/28/2015); Insertion of dialysis catheter (Right, 01/22/2021); Insertion of dialysis catheter (Right, 04/16/2021); and AV fistula placement (Left, 05/15/2021). Family: family history includes Diabetes type II in his mother; Heart attack in his father.  Laboratory Chemistry Profile   Renal Lab  Results  Component Value Date   BUN 39 (H) 05/15/2021   CREATININE 3.00 (H) 05/15/2021   LABCREA 35.88 01/16/2021   BCR 19 04/12/2021   GFR 65.09 01/29/2017   GFRAA 42 (L) 12/30/2019   GFRNONAA 18 (L) 04/18/2021   GFRNONAA 17 (L) 04/18/2021    Hepatic Lab Results  Component Value Date   AST 16 04/18/2021   ALT 9 04/18/2021   ALBUMIN 2.0 (L) 04/18/2021   ALBUMIN 2.0 (L) 04/18/2021   ALKPHOS 143 (H) 04/18/2021   LIPASE 21 12/23/2020   AMMONIA 50 12/23/2019    Electrolytes Lab Results  Component Value Date   NA 136 05/15/2021   K 4.2 05/15/2021   CL 99 05/15/2021   CALCIUM 7.3 (L) 04/18/2021   CALCIUM 7.1 (L) 04/18/2021   MG 1.7 02/06/2021   PHOS 6.0 (H) 04/18/2021    Bone Lab Results  Component Value Date   VD25OH 25.27 (L) 12/28/2020   TESTOFREE 43.1 (L) 02/28/2015   TESTOSTERONE 295 11/25/2017    Inflammation (CRP: Acute Phase) (ESR: Chronic Phase) Lab Results  Component Value Date   CRP 14.5 (H) 01/09/2021   ESRSEDRATE 11 12/02/2018   LATICACIDVEN 1.2 04/13/2021         Note: Above Lab results reviewed.  Recent Imaging Review  VAS [place] ABI WITH/WO TBI  LOWER EXTREMITY DOPPLER STUDY  Patient Name:  CINCH ORMOND Munger SR.  Date of Exam:   06/06/2021 Medical Rec #: [id number]             Accession #:    [id number] Date of Birth: Jul 09, 1941             Patient Gender: M Patient Age:   30 years Exam Location:  [facility] Procedure:      VAS [place] ABI WITH/WO TBI Referring Phys: Jenna Luo  --------------------------------------------------------------------------------   Indications: Left leg pain.  High Risk Factors: Diabetes.  Other Factors: Calciphylaxis.  Performing Technologist: Ralene Cork RVT    Examination Guidelines: A complete evaluation includes at minimum, Doppler waveform signals and systolic blood pressure reading at the level of bilateral brachial, anterior tibial, and posterior tibial arteries, when vessel  segments are accessible. Bilateral testing is considered an integral part of a complete examination. Photoelectric Plethysmograph (PPG) waveforms and toe systolic pressure readings are included as required and additional duplex testing as needed. Limited examinations for reoccurring indications may be performed as noted.    ABI Findings: +---------+------------------+-----+----------+--------+ Right    Rt Pressure (mmHg)IndexWaveform  Comment  +---------+------------------+-----+----------+--------+ Brachial 130                                       +---------+------------------+-----+----------+--------+  PTA      255               1.96 biphasic           +---------+------------------+-----+----------+--------+ DP       255               1.96 monophasic         +---------+------------------+-----+----------+--------+ Great Toe255               1.96                    +---------+------------------+-----+----------+--------+  +---------+------------------+-----+--------+-------+ Left     Lt Pressure (mmHg)IndexWaveformComment +---------+------------------+-----+--------+-------+ PTA      255               1.96 biphasic        +---------+------------------+-----+--------+-------+ DP       255               1.96 biphasic        +---------+------------------+-----+--------+-------+ Great Toe253               1.95                 +---------+------------------+-----+--------+-------+  +-------+-----------+-----------+------------+------------+ ABI/TBIToday's ABIToday's TBIPrevious ABIPrevious TBI +-------+-----------+-----------+------------+------------+ Right  [place]         [place]                                  +-------+-----------+-----------+------------+------------+ Left   [place]                                           +-------+-----------+-----------+------------+------------+     No previous ABI.    Summary: Right: Resting right ankle-brachial index indicates noncompressible right lower extremity arteries. The right toe-brachial index is abnormal.  Left: Resting left ankle-brachial index indicates noncompressible left lower extremity arteries. The left toe-brachial index is abnormal.    *See table(s) above for measurements and observations.    Electronically signed by Curt Jews MD on 06/06/2021 at 3:02:04 PM.       Final   Note: Reviewed        Physical Exam  General appearance: Well nourished, well developed, and well hydrated. In no apparent acute distress Mental status: Alert, oriented x 3 (person, place, & time)       Respiratory: No evidence of acute respiratory distress Eyes: PERLA Vitals: BP (!) 145/74   Pulse 77   Temp 97.6 F (36.4 C)   Resp 18   Ht 5' 8"  (1.727 m)   Wt 169 lb (76.7 kg)   SpO2 99%   BMI 25.70 kg/m  BMI: Estimated body mass index is 25.7 kg/m as calculated from the following:   Height as of this encounter: 5' 8"  (1.727 m).   Weight as of this encounter: 169 lb (76.7 kg). Ideal: Ideal body weight: 68.4 kg (150 lb 12.7 oz) Adjusted ideal body weight: 71.7 kg (158 lb 1.2 oz)  Left lower extremity ulcer, erythematous rash, necrotic eschar  Assessment   Diagnosis Status  1. Chronic pain syndrome   2. Calciphylaxis of left lower extremity with nonhealing ulcer with necrosis of muscle ([place])   3. End stage renal disease ([facility])   4. Pain management contract signed   5. Type 2 diabetes mellitus with  diabetic polyneuropathy, with long-term current use of insulin ([place])   6. Encounter for long-term opiate analgesic use    Controlled Controlled Controlled   Plan of Care    [name] Sr. has a current medication list which includes the following long-term medication(s): diltiazem, insulin aspart, insulin glargine, metoprolol tartrate, and pravastatin.  Pharmacotherapy (Medications Ordered): Meds ordered this encounter  Medications    HYDROcodone-acetaminophen (NORCO) 10-325 MG tablet    Sig: Take 1 tablet by mouth every 6 (six) hours as needed for severe pain. Must last 30 days.    Dispense:  120 tablet    Refill:  0    Chronic Pain: STOP Act (Not applicable) Fill 1 day early if closed on refill date. Avoid benzodiazepines within 8 hours of opioids   HYDROcodone-acetaminophen (NORCO) 10-325 MG tablet    Sig: Take 1 tablet by mouth every 6 (six) hours as needed for severe pain. Must last 30 days.    Dispense:  120 tablet    Refill:  0    Chronic Pain: STOP Act (Not applicable) Fill 1 day early if closed on refill date. Avoid benzodiazepines within 8 hours of opioids   HYDROcodone-acetaminophen (NORCO) 10-325 MG tablet    Sig: Take 1 tablet by mouth every 6 (six) hours as needed for severe pain. Must last 30 days.    Dispense:  120 tablet    Refill:  0    Chronic Pain: STOP Act (Not applicable) Fill 1 day early if closed on refill date. Avoid benzodiazepines within 8 hours of opioids    Follow-up plan:   Return in about 3 months (around 05/07/2022) for Medication Management, in person.    Recent Visits Date Type Provider Dept  11/15/21 Office Visit [name], MD [facility]  Showing recent visits within past 90 days and meeting all other requirements Today's Visits Date Type Provider Dept  01/31/22 Office Visit [name], MD [facility]  Showing today's visits and meeting all other requirements Future Appointments Date Type Provider Dept  04/30/22 Appointment [name], MD [facility]  Showing future appointments within next 90 days and meeting all other requirements  I discussed the assessment and treatment plan with the patient. The patient was provided an opportunity to ask questions and all were answered. The patient agreed with the plan and demonstrated an understanding of the instructions.  Patient advised to call back or seek an in-person evaluation if the  symptoms or condition worsens.  Duration of encounter: 3mnutes.  Note by: BGillis Santa MD Date: 01/31/2022; Time: 2:33 PM

## 2022-01-31 NOTE — Progress Notes (Signed)
Nursing Pain Medication Assessment:  Safety precautions to be maintained throughout the outpatient stay will include: orient to surroundings, keep bed in low position, maintain call bell within reach at all times, provide assistance with transfer out of bed and ambulation.  Medication Inspection Compliance: Pill count conducted under aseptic conditions, in front of the patient. Neither the pills nor the bottle was removed from the patient's sight at any time. Once count was completed pills were immediately returned to the patient in their original bottle.  Medication: Hydrocodone/APAP Pill/Patch Count:  22 of 120 pills remain Pill/Patch Appearance: Markings consistent with prescribed medication Bottle Appearance: Standard pharmacy container. Clearly labeled. Filled Date: 06 / 05 / 2023 Last Medication intake:  Today

## 2022-02-01 DIAGNOSIS — E1122 Type 2 diabetes mellitus with diabetic chronic kidney disease: Secondary | ICD-10-CM | POA: Diagnosis not present

## 2022-02-01 DIAGNOSIS — N186 End stage renal disease: Secondary | ICD-10-CM | POA: Diagnosis not present

## 2022-02-01 DIAGNOSIS — Z992 Dependence on renal dialysis: Secondary | ICD-10-CM | POA: Diagnosis not present

## 2022-02-04 DIAGNOSIS — N2581 Secondary hyperparathyroidism of renal origin: Secondary | ICD-10-CM | POA: Diagnosis not present

## 2022-02-04 DIAGNOSIS — D689 Coagulation defect, unspecified: Secondary | ICD-10-CM | POA: Diagnosis not present

## 2022-02-04 DIAGNOSIS — N186 End stage renal disease: Secondary | ICD-10-CM | POA: Diagnosis not present

## 2022-02-04 DIAGNOSIS — Z992 Dependence on renal dialysis: Secondary | ICD-10-CM | POA: Diagnosis not present

## 2022-02-04 DIAGNOSIS — E876 Hypokalemia: Secondary | ICD-10-CM | POA: Diagnosis not present

## 2022-02-07 ENCOUNTER — Encounter: Payer: PPO | Attending: Physician Assistant | Admitting: Physician Assistant

## 2022-02-07 DIAGNOSIS — Z992 Dependence on renal dialysis: Secondary | ICD-10-CM | POA: Insufficient documentation

## 2022-02-07 DIAGNOSIS — N184 Chronic kidney disease, stage 4 (severe): Secondary | ICD-10-CM | POA: Diagnosis not present

## 2022-02-07 DIAGNOSIS — L98492 Non-pressure chronic ulcer of skin of other sites with fat layer exposed: Secondary | ICD-10-CM | POA: Insufficient documentation

## 2022-02-07 DIAGNOSIS — E1122 Type 2 diabetes mellitus with diabetic chronic kidney disease: Secondary | ICD-10-CM | POA: Diagnosis not present

## 2022-02-07 DIAGNOSIS — L97822 Non-pressure chronic ulcer of other part of left lower leg with fat layer exposed: Secondary | ICD-10-CM | POA: Insufficient documentation

## 2022-02-07 DIAGNOSIS — L97222 Non-pressure chronic ulcer of left calf with fat layer exposed: Secondary | ICD-10-CM | POA: Diagnosis not present

## 2022-02-07 DIAGNOSIS — N4889 Other specified disorders of penis: Secondary | ICD-10-CM | POA: Diagnosis not present

## 2022-02-07 NOTE — Progress Notes (Addendum)
[name], [name] ([id number]) Visit Report for 02/07/2022 Arrival Information Details Patient Name: [name], [name]. Date of Service: 02/07/2022 2:15 PM Medical Record Number: [id number] Patient Account Number: [id number] Date of Birth/Sex: Dec 29, 1940 (81 y.o. M) Treating RN: Levora Dredge Primary Care Irean Kendricks: Jenna Luo Other Clinician: Massie Kluver Referring Toma Erichsen: Jenna Luo Treating Winfred Redel/Extender: Skipper Cliche in Treatment: 37 Visit Information History Since Last Visit All ordered tests and consults were completed: No Patient Arrived: [name] Added or deleted any medications: No Arrival Time: 14:34 Any new allergies or adverse reactions: No Transfer Assistance: None Had a fall or experienced change in No Patient Requires Transmission-Based Precautions: No activities of daily living that may affect Patient Has Alerts: No risk of falls: Hospitalized since last visit: No Pain Present Now: No Electronic Signature(s) Signed: 02/07/2022 4:13:59 PM By: Massie Kluver Entered By: Massie Kluver on 02/07/2022 14:35:51 [name], [name] ([id number]) -------------------------------------------------------------------------------- Clinic Level of Care Assessment Details Patient Name: Mark Morale L. Date of Service: 02/07/2022 2:15 PM Medical Record Number: [id number] Patient Account Number: [id number] Date of Birth/Sex: [birth year] (81 y.o. M) Treating RN: Levora Dredge Primary Care Hendrix Yurkovich: Jenna Luo Other Clinician: Massie Kluver Referring Hiro Vipond: Jenna Luo Treating Farooq Petrovich/Extender: Skipper Cliche in Treatment: 37 Clinic Level of Care Assessment Items TOOL 4 Quantity Score []  - Use when only an EandM is performed on FOLLOW-UP visit 0 ASSESSMENTS - Nursing Assessment / Reassessment X - Reassessment of Co-morbidities (includes updates in patient status) 1 10 X- 1 5 Reassessment of Adherence to Treatment Plan ASSESSMENTS - Wound and  Skin Assessment / Reassessment []  - Simple Wound Assessment / Reassessment - one wound 0 X- 2 5 Complex Wound Assessment / Reassessment - multiple wounds []  - 0 Dermatologic / Skin Assessment (not related to wound area) ASSESSMENTS - Focused Assessment []  - Circumferential Edema Measurements - multi extremities 0 []  - 0 Nutritional Assessment / Counseling / Intervention []  - 0 Lower Extremity Assessment (monofilament, tuning fork, pulses) []  - 0 Peripheral Arterial Disease Assessment (using hand held doppler) ASSESSMENTS - Ostomy and/or Continence Assessment and Care []  - Incontinence Assessment and Management 0 []  - 0 Ostomy Care Assessment and Management (repouching, etc.) PROCESS - Coordination of Care X - Simple Patient / Family Education for ongoing care 1 15 []  - 0 Complex (extensive) Patient / Family Education for ongoing care []  - 0 Staff obtains Programmer, systems, Records, Test Results / Process Orders []  - 0 Staff telephones HHA, Nursing Homes / Clarify orders / etc []  - 0 Routine Transfer to another Facility (non-emergent condition) []  - 0 Routine Hospital Admission (non-emergent condition) []  - 0 New Admissions / Biomedical engineer / Ordering NPWT, Apligraf, etc. []  - 0 Emergency Hospital Admission (emergent condition) X- 1 10 Simple Discharge Coordination []  - 0 Complex (extensive) Discharge Coordination PROCESS - Special Needs []  - Pediatric / Minor Patient Management 0 []  - 0 Isolation Patient Management []  - 0 Hearing / Language / Visual special needs []  - 0 Assessment of Community assistance (transportation, D/C planning, etc.) []  - 0 Additional assistance / Altered mentation []  - 0 Support Surface(s) Assessment (bed, cushion, seat, etc.) INTERVENTIONS - Wound Cleansing / Measurement [name], [name] ([id number]) []  - 0 Simple Wound Cleansing - one wound X- 2 5 Complex Wound Cleansing - multiple wounds X- 1 5 Wound Imaging (photographs - any  number of wounds) []  - 0 Wound Tracing (instead of photographs) []  - 0 Simple Wound Measurement - one wound X- 2 5 Complex Wound Measurement -  multiple wounds INTERVENTIONS - Wound Dressings []  - Small Wound Dressing one or multiple wounds 0 X- 2 15 Medium Wound Dressing one or multiple wounds []  - 0 Large Wound Dressing one or multiple wounds []  - 0 Application of Medications - topical []  - 0 Application of Medications - injection INTERVENTIONS - Miscellaneous []  - External ear exam 0 []  - 0 Specimen Collection (cultures, biopsies, blood, body fluids, etc.) []  - 0 Specimen(s) / Culture(s) sent or taken to Lab for analysis []  - 0 Patient Transfer (multiple staff / Civil Service fast streamer / Similar devices) []  - 0 Simple Staple / Suture removal (25 or less) []  - 0 Complex Staple / Suture removal (26 or more) []  - 0 Hypo / Hyperglycemic Management (close monitor of Blood Glucose) []  - 0 Ankle / Brachial Index (ABI) - do not check if billed separately X- 1 5 Vital Signs Has the patient been seen at the hospital within the last three years: Yes Total Score: 110 Level Of Care: New/Established - Level 3 Electronic Signature(s) Signed: 02/07/2022 4:13:59 PM By: Massie Kluver Entered By: Massie Kluver on 02/07/2022 15:10:33 [name], [name] ([id number]) -------------------------------------------------------------------------------- Encounter Discharge Information Details Patient Name: Mark Morale L. Date of Service: 02/07/2022 2:15 PM Medical Record Number: [id number] Patient Account Number: [id number] Date of Birth/Sex: [birth year] (81 y.o. M) Treating RN: Levora Dredge Primary Care Cheris Tweten: Jenna Luo Other Clinician: Massie Kluver Referring Breion Novacek: Jenna Luo Treating Rada Zegers/Extender: Skipper Cliche in Treatment: 37 Encounter Discharge Information Items Discharge Condition: Stable Ambulatory Status: Walker Discharge Destination: Home Transportation:  Private Auto Accompanied By: wife Schedule Follow-up Appointment: Yes Clinical Summary of Care: Electronic Signature(s) Signed: 02/07/2022 4:13:59 PM By: Massie Kluver Entered By: Massie Kluver on 02/07/2022 15:23:11 [name], [name] ([id number]) -------------------------------------------------------------------------------- Lower Extremity Assessment Details Patient Name: [name], [name] Date of Service: 02/07/2022 2:15 PM Medical Record Number: [id number] Patient Account Number: [id number] Date of Birth/Sex: July 10, 1941 (81 y.o. M) Treating RN: Levora Dredge Primary Care Wynema Garoutte: Jenna Luo Other Clinician: Massie Kluver Referring Alexina Niccoli: Jenna Luo Treating Heavenleigh Petruzzi/Extender: Jeri Cos Weeks in Treatment: 37 Electronic Signature(s) Signed: 02/07/2022 4:13:59 PM By: Massie Kluver Signed: 02/07/2022 4:47:59 PM By: Levora Dredge Entered By: Massie Kluver on 02/07/2022 14:52:09 [name], [name] ([id number]) -------------------------------------------------------------------------------- Multi Wound Chart Details Patient Name: [name], [name] Date of Service: 02/07/2022 2:15 PM Medical Record Number: [id number] Patient Account Number: [id number] Date of Birth/Sex: November 10, 1940 (81 y.o. 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 Comorbid History: Coronary Artery Disease, Type II Coronary Artery Disease, Type II  N/A Diabetes Diabetes Date Acquired: 04/05/2021 01/20/2022 N/A Weeks of Treatment: 37 2 N/A Wound Status: Open Open N/A Wound Recurrence: No No N/A Clustered Wound: Yes No N/A Clustered Quantity: 3 N/A N/A Measurements L x W x D (cm) 1.7x3.5x0.1 0.4x0.2x0.1 N/A Area (cm) : 4.673 0.063 N/A Volume (cm) : 0.467 0.006 N/A % Reduction in Area: 97.30% 94.70% N/A % Reduction in Volume: 99.30% 94.90% N/A Classification: Full Thickness Without Exposed Grade 1 N/A Support Structures Exudate Amount: Medium Small N/A Exudate Type: Serosanguineous Sanguinous N/A Exudate Color: red, brown red N/A Granulation Amount:  Large (67-100%) Small (1-33%) N/A Granulation Quality: Red, Hyper-granulation Red N/A Necrotic Amount: None Present (0%) None Present (0%) N/A Exposed Structures: Fat Layer (Subcutaneous Tissue): Fat Layer (Subcutaneous Tissue): N/A Yes Yes Fascia: No Fascia: No Tendon: No Tendon: No Muscle: No Joint: No Joint: No Bone: No Bone: No Epithelialization: None Small (1-33%) N/A Treatment Notes Electronic Signature(s) Signed: 02/07/2022 4:13:59 PM By: Massie Kluver Entered By: Massie Kluver on 02/07/2022 14:52:23 [name], [name] ([id number]) [name], [name] ([id number]) -------------------------------------------------------------------------------- [place] Details Patient Name: [name], [name] Date of Service: 02/07/2022 2:15 PM Medical Record Number: [id number] Patient Account Number: [id number] Date of Birth/Sex: October 12, 1940 (81 y.o. M) Treating RN: Levora Dredge Primary Care Bradie Lacock: Jenna Luo Other Clinician: Massie Kluver Referring Sparsh Callens: Jenna Luo Treating Tekeyah Santiago/Extender: Skipper Cliche in Treatment: 37 Active Inactive Wound/Skin Impairment Nursing Diagnoses: Knowledge deficit related to ulceration/compromised skin integrity Goals: Patient/caregiver will verbalize understanding of skin care regimen Date  Initiated: 05/24/2021 Target Resolution Date: 01/08/2022 Goal Status: Active Ulcer/skin breakdown will have a volume reduction of 30% by week 4 Date Initiated: 05/24/2021 Date Inactivated: 08/14/2021 Target Resolution Date: 07/24/2021 Goal Status: Unmet Unmet Reason: comorbites Ulcer/skin breakdown will have a volume reduction of 50% by week 8 Date Initiated: 05/24/2021 Date Inactivated: 09/25/2021 Target Resolution Date: 08/24/2021 Goal Status: Unmet Unmet Reason: comorbities Ulcer/skin breakdown will have a volume reduction of 80% by week 12 Date Initiated: 05/24/2021 Date Inactivated: 09/25/2021 Target Resolution Date: 09/24/2021 Goal Status: Unmet Unmet Reason: comorbities Ulcer/skin breakdown will heal within 14 weeks Date Initiated: 05/24/2021 Date Inactivated: 10/30/2021 Target Resolution Date: 10/22/2021 Goal Status: Unmet Unmet Reason: comorbities Interventions: Assess patient/caregiver ability to obtain necessary supplies Assess patient/caregiver ability to perform ulcer/skin care regimen upon admission and as needed Assess ulceration(s) every visit Notes: Electronic Signature(s) Signed: 02/07/2022 4:13:59 PM By: Massie Kluver Signed: 02/07/2022 4:47:59 PM By: Levora Dredge Entered By: Massie Kluver on 02/07/2022 14:52:16 [name], Nathaniel L. ([id number]) -------------------------------------------------------------------------------- Pain Assessment Details Patient Name: Herda, Ashe L. Date of Service: 02/07/2022 2:15 PM Medical Record Number: [id number] Patient Account Number: [id number] Date of Birth/Sex: 19-Feb-1941 (81 y.o. M) Treating RN: Levora Dredge Primary Care Chiann Goffredo: Jenna Luo Other Clinician: Massie Kluver Referring Kimetha Trulson: Jenna Luo Treating Chantal Worthey/Extender: Skipper Cliche in Treatment: 37 Active Problems Location of Pain Severity and Description of Pain Patient Has Paino No Site Locations Pain Management and Medication Current  Pain Management: Electronic Signature(s) Signed: 02/07/2022 4:13:59 PM By: Massie Kluver Signed: 02/07/2022 4:47:59 PM By: Levora Dredge Entered By: Massie Kluver on 02/07/2022 14:40:14 Valbuena, Kepler L. ([id number]) -------------------------------------------------------------------------------- Patient/Caregiver Education Details Patient Name: Mark Morale L. Date of Service: 02/07/2022 2:15 PM Medical Record Number: [id number] Patient Account Number: [id number] Date of Birth/Gender: January 03, 1941 (81 y.o. M) Treating RN: Levora Dredge Primary Care Physician: Jenna Luo Other Clinician: Massie Kluver Referring Physician: Jenna Luo Treating Physician/Extender: Skipper Cliche in Treatment: 37 Education Assessment Education Provided To: Patient Education Topics Provided Wound/Skin Impairment: Handouts: Other: continue wound care as directed Methods: Explain/Verbal Responses: State content correctly Electronic Signature(s) Signed: 02/07/2022 4:13:59 PM By: Massie Kluver Entered By: Massie Kluver on 02/07/2022 15:11:09 Rhymes, Kwan L. ([id number]) -------------------------------------------------------------------------------- Wound Assessment Details Patient Name: Tatsch, Locke L. Date of Service: 02/07/2022 2:15 PM Medical Record Number: [id number] Patient Account Number: [id number] Date of Birth/Sex: [birth year] (81 y.o. M) Treating RN: Levora Dredge Primary Care Clifton [name]: Jenna Luo Other Clinician: Massie Kluver Referring Cerys Winget: Jenna Luo Treating Namiko Pritts/Extender: Skipper Cliche in Treatment: 37 Wound Status Wound Number: 1 Primary Etiology: Calciphylaxis Wound  Location: Left, Posterior Lower Leg Wound Status: Open Wounding Event: Gradually Appeared Comorbid History: Coronary Artery Disease, Type II Diabetes Date Acquired: 04/05/2021 Weeks Of Treatment: 37 Clustered Wound: Yes Photos Wound Measurements Length: (cm) 1.7 Width:  (cm) 3.5 Depth: (cm) 0.1 Clustered Quantity: 3 Area: (cm) 4.673 Volume: (cm) 0.467 % Reduction in Area: 97.3% % Reduction in Volume: 99.3% Epithelialization: None Wound Description Classification: Full Thickness Without Exposed Support Structu Exudate Amount: Medium Exudate Type: Serosanguineous Exudate Color: red, brown res Foul Odor After Cleansing: No Slough/Fibrino No Wound Bed Granulation Amount: Large (67-100%) Exposed Structure Granulation Quality: Red, Hyper-granulation Fascia Exposed: No Necrotic Amount: None Present (0%) Fat Layer (Subcutaneous Tissue) Exposed: Yes Tendon Exposed: No Muscle Exposed: No Joint Exposed: No Bone Exposed: No Treatment Notes Wound #1 (Lower Leg) Wound Laterality: Left, Posterior Cleanser Dakin 16 (oz) 0.25 Discharge Instruction: Use as directed. Peri-Wound Care JATIN, NAUMANN ([id number]) Topical Primary Dressing Gauze Discharge Instruction: As directed: moistened with Dakins Solution Secondary Dressing Kerlix 4.5 x 4.1 (in/yd) Discharge Instruction: Apply Kerlix 4.5 x 4.1 (in/yd) as instructed Secured With [place] H Soft Cloth Surgical Tape, 2x2 (in/yd) Compression Wrap Compression Stockings Add-Ons Electronic Signature(s) Signed: 02/07/2022 4:13:59 PM By: Massie Kluver Signed: 02/07/2022 4:47:59 PM By: Levora Dredge Entered By: Massie Kluver on 02/07/2022 14:51:09 Heitmeyer, Jiaire L. ([id number]) -------------------------------------------------------------------------------- Wound Assessment Details Patient Name: Seiber, Babyboy L. Date of Service: 02/07/2022 2:15 PM Medical Record Number: [id number] Patient Account Number: [id number] Date of Birth/Sex: 1940-10-11 (81 y.o. M) Treating RN: Levora Dredge Primary Care Maevis Mumby: Jenna Luo Other Clinician: Massie Kluver Referring Kaelen Brennan: Jenna Luo Treating Amier Hoyt/Extender: Skipper Cliche in Treatment: 37 Wound Status Wound  Number: 3 Primary Etiology: Diabetic Wound/Ulcer of the Lower Extremity Wound Location: Left, Anterior Lower Leg Wound Status: Open Wounding Event: Not Known Comorbid History: Coronary Artery Disease, Type II Diabetes Date Acquired: 01/20/2022 Weeks Of Treatment: 2 Clustered Wound: No Photos Wound Measurements Length: (cm) 0.4 Width: (cm) 0.2 Depth: (cm) 0.1 Area: (cm) 0.063 Volume: (cm) 0.006 % Reduction in Area: 94.7% % Reduction in Volume: 94.9% Epithelialization: Small (1-33%) Wound Description Classification: Grade 1 Foul O Exudate Amount: Small Slough Exudate Type: Sanguinous Exudate Color: red dor After Cleansing: No /Fibrino No Wound Bed Granulation Amount: Small (1-33%) Exposed Structure Granulation Quality: Red Fascia Exposed: No Necrotic Amount: None Present (0%) Fat Layer (Subcutaneous Tissue) Exposed: Yes Tendon Exposed: No Joint Exposed: No Bone Exposed: No Treatment Notes Wound #3 (Lower Leg) Wound Laterality: Left, Anterior Cleanser Peri-Wound Care Topical Primary Dressing Gauze Dorin, Keen L. ([id number]) Discharge Instruction: As directed: moistened with Dakins Solution Secondary Dressing Kerlix 4.5 x 4.1 (in/yd) Discharge Instruction: Apply Kerlix 4.5 x 4.1 (in/yd) as instructed Secured With Medipore Tape - 32M Medipore H Soft Cloth Surgical Tape, 2x2 (in/yd) Compression Wrap Compression Stockings Add-Ons Electronic Signature(s) Signed: 02/07/2022 4:13:59 PM By: Massie Kluver Signed: 02/07/2022 4:47:59 PM By: Levora Dredge Entered By: Massie Kluver on 02/07/2022 14:51:56 Everhart, Kraven L. ([id number]) -------------------------------------------------------------------------------- Vitals Details Patient Name: Baillie, Pamela L. Date of Service: 02/07/2022 2:15 PM Medical Record Number: [id number] Patient Account Number: [id number] Date of Birth/Sex: Dec 13, 1940 (81 y.o. M) Treating RN: Levora Dredge Primary Care Cyan Clippinger: Jenna Luo Other Clinician: Massie Kluver Referring Shirrell Solinger: Jenna Luo Treating Elson Ulbrich/Extender: Skipper Cliche in Treatment: 37 Vital Signs Time Taken: 14:35 Temperature (F): 97.7 Height (in): 68 Pulse (bpm): 67 Weight (lbs): 172 Respiratory Rate (breaths/min): 18 Body Mass Index (BMI): 26.1 Blood Pressure (mmHg): 121/68 Reference Range: 80 - 120  mg / dl Electronic Signature(s) Signed: 02/07/2022 4:13:59 PM By: Massie Kluver Entered By: Massie Kluver on 02/07/2022 14:40:08

## 2022-02-07 NOTE — Progress Notes (Addendum)
[name], [name] ([id number]) Visit Report for 02/07/2022 Chief Complaint Document Details Patient Name: [name], [name]. Date of Service: 02/07/2022 2:15 PM Medical Record Number: [id number] Patient Account Number: [id number] Date of Birth/Sex: 01-Oct-1940 (81 y.o. M) Treating RN: Levora Dredge Primary Care Provider: Jenna Luo Other Clinician: Massie Kluver Referring Provider: Jenna Luo Treating Provider/Extender: Skipper Cliche in Treatment: 37 Information Obtained from: Patient Chief Complaint Left LE Calciphylaxis and Glans Penis Calciphylaxis Electronic Signature(s) Signed: 02/07/2022 2:07:35 PM By: Worthy Keeler PA-C Entered By: Worthy Keeler on 02/07/2022 14:07:35 [name], [name] ([id number]) -------------------------------------------------------------------------------- HPI Details Patient Name: [name] L. Date of Service: 02/07/2022 2:15 PM Medical Record Number: [id number] Patient Account Number: [id number] Date of Birth/Sex: [birth year] (81 y.o. 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 This has been present for a couple of months currently he is on the sodium thiosulfate at dialysis. He tells me that they picked up on this quickly and have been managing it but he really has not had any help with anything else from the standpoint of progressing the wound itself. The patient does have again end-stage renal disease which currently is listed as stage IV but he is on renal dialysis. He also has coronary artery disease as well as going shortly for formal arterial  studies although he did have a quick test of the tibial artery with his vascular doctor when he went in to check on his fistula for his arm and subsequently they did not feel like he had any hemodynamically unstable flow into the lower extremity this is good news. Nonetheless I think that the biggest issue here is going to be getting some of the eschar off so that we get the wounds med headed in the appropriate direction. 05/31/2021 upon evaluation today patient appears to be doing well with regard to his leg ulceration with calciphylaxis. I am very pleased in this regard. Fortunately there does not appear to be any signs of active infection at this time which is great news. No fevers, chills, nausea, vomiting, or diarrhea. 06/07/2021 upon evaluation today patient appears to be doing well with regard to his legs. Unfortunately he is having an issue here with his penis as well he did mention at the end of last visit. I suggested that he go see urology. However when he went to see the urologist they basically told him there was not anything they could do. They recommended that we needed to be the ones to take care of this. With that being said this is an unusual area that to be honest a lot of the traditional wound care products we use are not really good to be good for this region. Potentially Medihoney alginate could be a possibility also think Santyl could be a possibility. With that being said I think initially my suggestion is probably can be for [place] to attempt the Santyl to see if that will benefit the patient. 06/21/2021 upon evaluation today patient's wound bed actually showed signs of good granulation and epithelization at this point. Fortunately I do not see any signs of active infection systemically which is great news and his legs are doing awesome I think you are making great  progress. The Santyl on the glans penis also has been of benefit this is looking tremendously better. Again the  patient is pleased he also tells me the pain is significantly improved. Overall I think we are headed in the appropriate direction here. 07/05/2021 upon evaluation today patient appears to be doing well with regard to his wounds. Both the area on the tip of his penis as well as the left lateral leg is doing well. There is some debridement this can be needed over the leg region. Fortunately I think that overall the Dakin's moistened gauze has done a great job here. No fevers, chills, nausea, vomiting, or diarrhea. 07/12/2021 upon evaluation today patient presents for reevaluation here in the clinic and overall I think that his wounds are doing significantly better which is great news. This includes both the left leg as well as the glans of the penis. Both are showing signs of significant improvement which is great and overall I think that we are headed in the right direction. I do not see any evidence of active infection at this moment. 07/19/2021 upon evaluation today patient appears to be doing better in regard to his wounds. Were getting much closer to complete resolution in regard to the necrotic tissue being removed from the leg. Overall I am extremely pleased with where we stand today. Overall I do not see any signs of active infection at this time. With regard to the patient's penis this area is showing signs of improvement as well and I am very pleased with that. 07/26/2021 upon evaluation today patient appears to be doing well with regard to his wounds. The leg in fact is looking quite well and I am very pleased with where we stand today. I do not see any signs of infection which is great and there is a lot of new granulation growth also also. With regard to the penis area this also showed signs of excellent improvement which is great news. 08/14/2020 upon evaluation today patient appears to be doing well with regard to his wounds both the leg as well as the glans penis region. Both are showing  signs of improvement which is great news and overall I am extremely pleased with where we stand at this point. I think that the Dakin's moistened gauze dressing for her leg is doing great and the Santyl is definitely helping with the glans penis location. 08/28/2021 upon evaluation today patient appears to be doing well with regard to his wounds on the left leg as well as the penis. Fortunately both are showing signs of improvement. I am going to perform some debridement in regard to the leg there is a small area which is having trouble getting completely clean my work on that a little bit more today. 09/04/2021 upon evaluation today patient appears to be doing pretty well in regard to his wounds. 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 Again he has I just found out today been off of the sodium thiosulfate for about 2 to 3 weeks. He tells me that the initial "3 weeks" was stated to be up by the nurse. With that being said I think he probably is going to need it for a bit longer to be honest. We discussed that today. Over the penis area the Santyl does seem to be loose and a lot of this up and that is good  hopefully will be able to get this completely cleared off it sometime shortly. 09/13/2021 upon evaluation today patient appears to be doing well with regard to his wound. He has been tolerating the dressing changes without complication. Fortunately there does not appear to be any signs of active infection locally or systemically which is great news. Overall I think that he is making progress with regard to his wounds in general. 09/25/2021 upon evaluation today patient appears to be doing excellent in regard to his wounds. I am actually extremely pleased with where we stand today and I think he is making great progress. The patient likewise is happy to hear this. 10/09/2021 upon evaluation today patient  actually appears to be doing excellent in regard to his wound. He has been tolerating the dressing changes without complication. Fortunately I do not see any evidence of active infection locally or systemically which is great news. No fevers, Shrider, Taras L. ([id number]) chills, nausea, vomiting, or diarrhea. 3/14; patient presents for follow-up. He has no issues or complaints today. 10/23/2021 upon evaluation today patient appears to be doing well with regard to his wounds. In fact the penis appears to be pretty much healed there may be a point when opening but not much at all to be perfectly honest. In regard to the leg I Flygt were definitely seeing signs of significant improvement which is great news. 10/30/2021 upon evaluation today patient appears to be doing well with regard to his wounds. In fact the penis area is completely healed which is great news that just the leg that were dealing with and this is significantly smaller. I am actually extremely pleased with where we stand. 11-06-2021 upon evaluation today patient appears to be doing well with regard to his leg ulcers. All are measuring smaller and looking great. I do not see any signs of infection which is great news. No fever chills noted 11-13-2021 upon evaluation today patient appears to be doing well with regard to his wounds everything is still measuring smaller and looking excellent. I am extremely pleased with where we stand today. There does not appear to be any signs of active infection at this time. No fevers, chills, nausea, vomiting, or diarrhea. 11-20-2021 upon evaluation today patient actually appears to be doing quite well in regard to his wounds. Has been tolerating the dressing changes without complication. Fortunately I do not see any evidence of active infection locally or systemically at this time which is great news. No fevers, chills, nausea, vomiting, or diarrhea. I think were definitely headed in the right direction  here. 12-04-2021 upon evaluation today patient appears to be doing well with regard to his wounds. Overall things seem to be measuring smaller and doing much better. Fortunately there does not appear to be any evidence of active infection locally nor systemically which is great news. No fevers, chills, nausea, vomiting, or diarrhea. 12/11/2021 upon evaluation today patient actually appears to be doing great in regard to his wounds he has been tolerating the dressing changes without complication. Fortunately there does not appear to be any evidence of active infection locally or systemically which is great news. No fevers, chills, nausea, vomiting, or diarrhea. 12-25-2021 upon evaluation today patient appears to be doing excellent in regard to his leg ulcers he has been tolerating the dressing changes and fortunately I do not see any signs of active infection locally or systemically at this time which is great news. Overall I do believe that we are headed in the appropriate direction  here. I do not see any evidence that this is worsening in general which is great news. 01-08-2022 upon evaluation today patient's leg is almost completely closed on the lateral aspect of the posterior is doing awesome. I do not see any evidence of infection and overall I think he is really making excellent progress here. Overall I think we are on the right track. 01-24-2022 upon evaluation today patient's wound is actually showing signs of excellent improvement he did have 1 small area that reopened it looks as if he may have bumped this on something I am not 100% sure. With that being said he overall seems to be doing quite well and I am very pleased with where things stand currently I see no signs of active infection at this time which is great news. 02-07-2022 upon evaluation today patient is making excellent progress his wounds are measuring smaller looking much better and overall I am extremely pleased with where things stand  currently. I do not see any evidence of active infection locally or systemically which is great news. Upon inspection patient's wound bed actually showed signs of good granulation and epithelization at this point. Fortunately I think that he is doing awesome with regard to his healing and I am very pleased in that regard. I think that we are on the right track. Electronic Signature(s) Signed: 02/07/2022 4:08:35 PM By: Worthy Keeler PA-C Entered By: Worthy Keeler on 02/07/2022 16:08:35 [name], [name] ([id number]) -------------------------------------------------------------------------------- Physical Exam Details Patient Name: Baack, Myking L. Date of Service: 02/07/2022 2:15 PM Medical Record Number: [id number] Patient Account Number: [id number] Date of Birth/Sex: January 11, 1941 (81 y.o. M) Treating RN: Levora Dredge Primary Care Provider: Jenna Luo Other Clinician: Massie Kluver Referring Provider: Jenna Luo Treating Provider/Extender: Skipper Cliche in Treatment: 57 Constitutional Well-nourished and well-hydrated in no acute distress. Respiratory normal breathing without difficulty. Psychiatric this patient is able to make decisions and demonstrates good insight into disease process. Alert and Oriented x 3. pleasant and cooperative. Notes Patient's wounds again are showing signs of great epithelization and granulation I do not see any signs of infection overall I am extremely pleased with where we stand currently. Electronic Signature(s) Signed: 02/07/2022 4:08:46 PM By: Worthy Keeler PA-C Entered By: Worthy Keeler on 02/07/2022 16:08:46 [name], [name] ([id number]) -------------------------------------------------------------------------------- Physician Orders Details Patient Name: [name] L. Date of Service: 02/07/2022 2:15 PM Medical Record Number: [id number] Patient Account Number: [id number] Date of Birth/Sex: 02/28/1941 (81 y.o. M) Treating RN:  Levora Dredge Primary Care Provider: Jenna Luo Other Clinician: Massie Kluver Referring Provider: Jenna Luo Treating Provider/Extender: Skipper Cliche in Treatment: [phone] Verbal / Phone Orders: No Diagnosis Coding ICD-10 Coding Code Description E83.50 Unspecified disorder of calcium metabolism L97.822 Non-pressure chronic ulcer of other part of left lower leg with fat layer exposed L98.492 Non-pressure chronic ulcer of skin of other sites with fat layer exposed Z99.2 Dependence on renal dialysis N18.4 Chronic kidney disease, stage 4 (severe) I25.10 Atherosclerotic heart disease of native coronary artery without angina pectoris Follow-up Appointments o Return Appointment in 1 week. Edema Control - Lymphedema / Segmental Compressive Device / Other o Elevate, Exercise Daily and Avoid Standing for Long Periods of Time. o Elevate legs to the level of the heart and pump ankles as often as possible o Elevate leg(s) parallel to the floor when sitting. Medications-Please add to medication list. o Other: - AandD ointment to leg Wound Treatment Wound #1 - Lower Leg Wound Laterality: Left, Posterior Cleanser: Verdene Lennert  16 (oz) 0.25 1 x Per Day/30 Days Discharge Instructions: Use as directed. Primary Dressing: Gauze 1 x Per Day/30 Days Discharge Instructions: As directed: moistened with Dakins Solution Secondary Dressing: Kerlix 4.5 x 4.1 (in/yd) (Generic) 1 x Per Day/30 Days Discharge Instructions: Apply Kerlix 4.5 x 4.1 (in/yd) as instructed Secured With: Medipore Tape - 76M Medipore H Soft Cloth Surgical Tape, 2x2 (in/yd) (Generic) 1 x Per Day/30 Days Wound #3 - Lower Leg Wound Laterality: Left, Anterior Primary Dressing: Gauze 1 x Per Day/30 Days Discharge Instructions: As directed: moistened with Dakins Solution Secondary Dressing: Kerlix 4.5 x 4.1 (in/yd) (Generic) 1 x Per Day/30 Days Discharge Instructions: Apply Kerlix 4.5 x 4.1 (in/yd) as instructed Secured With:  Medipore Tape - 76M Medipore H Soft Cloth Surgical Tape, 2x2 (in/yd) (Generic) 1 x Per Day/30 Days Electronic Signature(s) Signed: 02/07/2022 4:13:59 PM By: Massie Kluver Signed: 02/07/2022 4:56:25 PM By: Worthy Keeler PA-C Entered By: Massie Kluver on 02/07/2022 15:09:37 [name], [name] ([id number]) -------------------------------------------------------------------------------- Problem List Details Patient Name: Culley, Avrian L. Date of Service: 02/07/2022 2:15 PM Medical Record Number: [id number] Patient Account Number: [id number] Date of Birth/Sex: 06-07-1941 (81 y.o. M) Treating RN: Levora Dredge Primary Care Provider: Jenna Luo Other Clinician: Massie Kluver Referring Provider: Jenna Luo Treating Provider/Extender: Skipper Cliche in Treatment: 37 Active Problems ICD-10 Encounter Code Description Active Date MDM Diagnosis E83.50 Unspecified disorder of calcium metabolism 05/24/2021 No Yes L97.822 Non-pressure chronic ulcer of other part of left lower leg with fat layer 05/24/2021 No Yes exposed L98.492 Non-pressure chronic ulcer of skin of other sites with fat layer exposed 06/07/2021 No Yes Z99.2 Dependence on renal dialysis 05/24/2021 No Yes N18.4 Chronic kidney disease, stage 4 (severe) 05/24/2021 No Yes I25.10 Atherosclerotic heart disease of native coronary artery without angina 05/24/2021 No Yes pectoris Inactive Problems Resolved Problems Electronic Signature(s) Signed: 02/07/2022 2:07:30 PM By: Worthy Keeler PA-C Entered By: Worthy Keeler on 02/07/2022 14:07:30 [name], [name] ([id number]) -------------------------------------------------------------------------------- Progress Note Details Patient Name: [name], [name] Date of Service: 02/07/2022 2:15 PM Medical Record Number: [id number] Patient Account Number: [id number] Date of Birth/Sex: 04-05-1941 (81 y.o. 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 This has been present for a couple of months currently he is on the sodium thiosulfate at dialysis. He tells me that they picked up on this quickly and have been managing it but he really has not had any help with anything else from the standpoint of progressing the wound itself. The patient does have again end-stage renal disease which currently is listed as stage IV but he is on renal dialysis. He also has coronary artery disease as well as going shortly for formal arterial studies although he did have a quick test of the tibial artery with his vascular doctor when he went in to check on his fistula for his arm and subsequently they did not feel like he had any hemodynamically unstable flow into the lower extremity this is good news. Nonetheless I think that the biggest issue here is going to be getting some of the eschar off so that we get the wounds  med headed in the appropriate direction. 05/31/2021 upon evaluation today patient appears to be doing well with regard to his leg ulceration with calciphylaxis. I am very pleased in this regard. Fortunately there does not appear to be any signs of active infection at this time which is great news. No fevers, chills, nausea, vomiting, or diarrhea. 06/07/2021 upon evaluation today patient appears to be doing well with regard to his legs. Unfortunately he is having an issue here with his penis as well he did mention at the end of last visit. I suggested that he go see urology. However when he went  to see the urologist they basically told him there was not anything they could do. They recommended that we needed to be the ones to take care of this. With that being said this is an unusual area that to be honest a lot of the traditional wound care products we use are not really good to be good for this region. Potentially Medihoney alginate could be a possibility also think Santyl could be a possibility. With that being said I think initially my suggestion is probably can be for [place] to attempt the Santyl to see if that will benefit the patient. 06/21/2021 upon evaluation today patient's wound bed actually showed signs of good granulation and epithelization at this point. Fortunately I do not see any signs of active infection systemically which is great news and his legs are doing awesome I think you are making great progress. The Santyl on the glans penis also has been of benefit this is looking tremendously better. Again the patient is pleased he also tells me the pain is significantly improved. Overall I think we are headed in the appropriate direction here. 07/05/2021 upon evaluation today patient appears to be doing well with regard to his wounds. Both the area on the tip of his penis as well as the left lateral leg is doing well. There is some debridement this can be needed over the leg region. Fortunately I think that overall the Dakin's moistened gauze has done a great job here. No fevers, chills, nausea, vomiting, or diarrhea. 07/12/2021 upon evaluation today patient presents for reevaluation here in the clinic and overall I think that his wounds are doing significantly better which is great news. This includes both the left leg as well as the glans of the penis. Both are showing signs of significant improvement which is great and overall I think that we are headed in the right direction. I do not see any evidence of active infection at this moment. 07/19/2021 upon evaluation today patient  appears to be doing better in regard to his wounds. Were getting much closer to complete resolution in regard to the necrotic tissue being removed from the leg. Overall I am extremely pleased with where we stand today. Overall I do not see any signs of active infection at this time. With regard to the patient's penis this area is showing signs of improvement as well and I am very pleased with that. 07/26/2021 upon evaluation today patient appears to be doing well with regard to his wounds. The leg in fact is looking quite well and I am very pleased with where we stand today. I do not see any signs of infection which is great and there is a lot of new granulation growth also also. With regard to the penis area this also showed signs of excellent improvement which is great news. 08/14/2020 upon evaluation today patient appears to be doing well with  regard to his wounds both the leg as well as the glans penis region. Both are showing signs of improvement which is great news and overall I am extremely pleased with where we stand at this point. I think that the Dakin's moistened gauze dressing for her leg is doing great and the Santyl is definitely helping with the glans penis location. 08/28/2021 upon evaluation today patient appears to be doing well with regard to his wounds on the left leg as well as the penis. Fortunately both are showing signs of improvement. I am going to perform some debridement in regard to the leg there is a small area which is having trouble getting completely clean my work on that a little bit more today. 09/04/2021 upon evaluation today patient appears to be doing pretty well in regard to his wounds. 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 Again he has I just found out today been off of the sodium thiosulfate for about 2 to 3 weeks. He tells me that the initial "3 weeks" was  stated to be up by the nurse. With that being said I think he probably is going to need it for a bit longer to be honest. We discussed that today. Over the penis area the Santyl does seem to be loose and a lot of this up and that is good hopefully will be able to get this completely cleared off it sometime shortly. 09/13/2021 upon evaluation today patient appears to be doing well with regard to his wound. He has been tolerating the dressing changes without complication. Fortunately there does not appear to be any signs of active infection locally or systemically which is great news. Overall I think that he is making progress with regard to his wounds in general. [name], [name] ([id number]) 09/25/2021 upon evaluation today patient appears to be doing excellent in regard to his wounds. I am actually extremely pleased with where we stand today and I think he is making great progress. The patient likewise is happy to hear this. 10/09/2021 upon evaluation today patient actually appears to be doing excellent in regard to his wound. He has been tolerating the dressing changes without complication. Fortunately I do not see any evidence of active infection locally or systemically which is great news. No fevers, chills, nausea, vomiting, or diarrhea. 3/14; patient presents for follow-up. He has no issues or complaints today. 10/23/2021 upon evaluation today patient appears to be doing well with regard to his wounds. In fact the penis appears to be pretty much healed there may be a point when opening but not much at all to be perfectly honest. In regard to the leg I Flygt were definitely seeing signs of significant improvement which is great news. 10/30/2021 upon evaluation today patient appears to be doing well with regard to his wounds. In fact the penis area is completely healed which is great news that just the leg that were dealing with and this is significantly smaller. I am actually extremely pleased with  where we stand. 11-06-2021 upon evaluation today patient appears to be doing well with regard to his leg ulcers. All are measuring smaller and looking great. I do not see any signs of infection which is great news. No fever chills noted 11-13-2021 upon evaluation today patient appears to be doing well with regard to his wounds everything is still measuring smaller and looking  excellent. I am extremely pleased with where we stand today. There does not appear to be any signs of active infection at this time. No fevers, chills, nausea, vomiting, or diarrhea. 11-20-2021 upon evaluation today patient actually appears to be doing quite well in regard to his wounds. Has been tolerating the dressing changes without complication. Fortunately I do not see any evidence of active infection locally or systemically at this time which is great news. No fevers, chills, nausea, vomiting, or diarrhea. I think were definitely headed in the right direction here. 12-04-2021 upon evaluation today patient appears to be doing well with regard to his wounds. Overall things seem to be measuring smaller and doing much better. Fortunately there does not appear to be any evidence of active infection locally nor systemically which is great news. No fevers, chills, nausea, vomiting, or diarrhea. 12/11/2021 upon evaluation today patient actually appears to be doing great in regard to his wounds he has been tolerating the dressing changes without complication. Fortunately there does not appear to be any evidence of active infection locally or systemically which is great news. No fevers, chills, nausea, vomiting, or diarrhea. 12-25-2021 upon evaluation today patient appears to be doing excellent in regard to his leg ulcers he has been tolerating the dressing changes and fortunately I do not see any signs of active infection locally or systemically at this time which is great news. Overall I do believe that we are headed in the appropriate  direction here. I do not see any evidence that this is worsening in general which is great news. 01-08-2022 upon evaluation today patient's leg is almost completely closed on the lateral aspect of the posterior is doing awesome. I do not see any evidence of infection and overall I think he is really making excellent progress here. Overall I think we are on the right track. 01-24-2022 upon evaluation today patient's wound is actually showing signs of excellent improvement he did have 1 small area that reopened it looks as if he may have bumped this on something I am not 100% sure. With that being said he overall seems to be doing quite well and I am very pleased with where things stand currently I see no signs of active infection at this time which is great news. 02-07-2022 upon evaluation today patient is making excellent progress his wounds are measuring smaller looking much better and overall I am extremely pleased with where things stand currently. I do not see any evidence of active infection locally or systemically which is great news. Upon inspection patient's wound bed actually showed signs of good granulation and epithelization at this point. Fortunately I think that he is doing awesome with regard to his healing and I am very pleased in that regard. I think that we are on the right track. Objective Constitutional Well-nourished and well-hydrated in no acute distress. Vitals Time Taken: 2:35 PM, Height: 68 in, Weight: 172 lbs, BMI: 26.1, Temperature: 97.7 F, Pulse: 67 bpm, Respiratory Rate: 18 breaths/min, Blood Pressure: 121/68 mmHg. Respiratory normal breathing without difficulty. Psychiatric this patient is able to make decisions and demonstrates good insight into disease process. Alert and Oriented x 3. pleasant and cooperative. General Notes: Patient's wounds again are showing signs of great epithelization and granulation I do not see any signs of infection overall I am extremely  pleased with where we stand currently. [name], [name] ([id number]) Integumentary (Hair, Skin) Wound #1 status is Open. Original cause of wound was Gradually Appeared. The date acquired was:  04/05/2021. The wound has been in treatment 37 weeks. The wound is located on the Left,Posterior Lower Leg. The wound measures 1.7cm length x 3.5cm width x 0.1cm depth; 4.673cm^2 area and 0.467cm^3 volume. There is Fat Layer (Subcutaneous Tissue) exposed. There is a medium amount of serosanguineous drainage noted. There is large (67-100%) red, hyper - granulation within the wound bed. There is no necrotic tissue within the wound bed. Wound #3 status is Open. Original cause of wound was Not Known. The date acquired was: 01/20/2022. The wound has been in treatment 2 weeks. The wound is located on the Left,Anterior Lower Leg. The wound measures 0.4cm length x 0.2cm width x 0.1cm depth; 0.063cm^2 area and 0.006cm^3 volume. There is Fat Layer (Subcutaneous Tissue) exposed. There is a small amount of sanguinous drainage noted. There is small (1-33%) red granulation within the wound bed. There is no necrotic tissue within the wound bed. 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 Edema Control - Lymphedema / Segmental Compressive Device / Other: Elevate, Exercise Daily and Avoid Standing for Long Periods of Time. Elevate legs to the level of the heart and pump ankles as often as possible Elevate leg(s) parallel to the floor when sitting. Medications-Please add to medication list.: Other: - AandD ointment to leg WOUND  #1: - Lower Leg Wound Laterality: Left, Posterior Cleanser: Dakin 16 (oz) 0.25 1 x Per Day/30 Days Discharge Instructions: Use as directed. Primary Dressing: Gauze 1 x Per Day/30 Days Discharge Instructions: As directed: moistened with Dakins Solution Secondary Dressing: Kerlix 4.5 x 4.1 (in/yd) (Generic) 1 x Per Day/30 Days Discharge Instructions: Apply Kerlix 4.5 x 4.1 (in/yd) as instructed Secured With: Medipore Tape - 734M Medipore H Soft Cloth Surgical Tape, 2x2 (in/yd) (Generic) 1 x Per Day/30 Days WOUND #3: - Lower Leg Wound Laterality: Left, Anterior Primary Dressing: Gauze 1 x Per Day/30 Days Discharge Instructions: As directed: moistened with Dakins Solution Secondary Dressing: Kerlix 4.5 x 4.1 (in/yd) (Generic) 1 x Per Day/30 Days Discharge Instructions: Apply Kerlix 4.5 x 4.1 (in/yd) as instructed Secured With: Medipore Tape - 734M Medipore H Soft Cloth Surgical Tape, 2x2 (in/yd) (Generic) 1 x Per Day/30 Days 1. I am going to recommend that we go ahead and continue with the wound care measures as before and the patient is in agreement with plan. This includes the use of the Dakin's moistened gauze dressing which does seem to be doing quite well. 2. I am also can recommend that we continue with the roll gauze to secure in place. 3. This is going to continue to be changed daily. We will see patient back for reevaluation in 2 weeks here in the clinic. If anything worsens or changes patient will contact our office for additional recommendations. Electronic Signature(s) Signed: 02/07/2022 4:09:17 PM By: Worthy Keeler PA-C Entered By: Worthy Keeler on 02/07/2022 16:09:16 [name], [name] ([id number]) -------------------------------------------------------------------------------- SuperBill Details Patient Name: [name] L. Date of Service: 02/07/2022 Medical Record Number: [id number] Patient Account Number: [id number] Date of Birth/Sex: 10/02/1940 (81 y.o. M) Treating RN:  Levora Dredge Primary Care Provider: Jenna Luo Other Clinician: Massie Kluver Referring Provider: Jenna Luo Treating Provider/Extender: Skipper Cliche in Treatment:  37 Diagnosis Coding ICD-10 Codes Code Description E83.50 Unspecified disorder of calcium metabolism L97.822 Non-pressure chronic ulcer of other part of left lower leg with fat layer exposed L98.492 Non-pressure chronic ulcer of skin of other sites with fat layer exposed Z99.2 Dependence on renal dialysis N18.4 Chronic kidney disease, stage 4 (severe) I25.10 Atherosclerotic heart disease of native coronary artery without angina pectoris Facility Procedures CPT4 Code: 15930123 Description: 99213 - WOUND CARE VISIT-LEV 3 EST PT Modifier: Quantity: 1 Physician Procedures CPT4 Code: 7990940 Description: 00505 - WC PHYS LEVEL 3 - EST PT Modifier: Quantity: 1 CPT4 Code: Description: ICD-10 Diagnosis Description E83.50 Unspecified disorder of calcium metabolism L97.822 Non-pressure chronic ulcer of other part of left lower leg with fat lay L98.492 Non-pressure chronic ulcer of skin of other sites with fat layer expose  Z99.2 Dependence on renal dialysis Modifier: er exposed d Quantity: Electronic Signature(s) Signed: 02/07/2022 4:09:31 PM By: Worthy Keeler PA-C Entered By: Worthy Keeler on 02/07/2022 16:09:31

## 2022-02-11 DIAGNOSIS — N2581 Secondary hyperparathyroidism of renal origin: Secondary | ICD-10-CM | POA: Diagnosis not present

## 2022-02-11 DIAGNOSIS — D689 Coagulation defect, unspecified: Secondary | ICD-10-CM | POA: Diagnosis not present

## 2022-02-11 DIAGNOSIS — R52 Pain, unspecified: Secondary | ICD-10-CM | POA: Diagnosis not present

## 2022-02-11 DIAGNOSIS — N186 End stage renal disease: Secondary | ICD-10-CM | POA: Diagnosis not present

## 2022-02-11 DIAGNOSIS — Z992 Dependence on renal dialysis: Secondary | ICD-10-CM | POA: Diagnosis not present

## 2022-02-18 DIAGNOSIS — N2581 Secondary hyperparathyroidism of renal origin: Secondary | ICD-10-CM | POA: Diagnosis not present

## 2022-02-18 DIAGNOSIS — Z992 Dependence on renal dialysis: Secondary | ICD-10-CM | POA: Diagnosis not present

## 2022-02-18 DIAGNOSIS — N186 End stage renal disease: Secondary | ICD-10-CM | POA: Diagnosis not present

## 2022-02-18 DIAGNOSIS — R52 Pain, unspecified: Secondary | ICD-10-CM | POA: Diagnosis not present

## 2022-02-18 DIAGNOSIS — D689 Coagulation defect, unspecified: Secondary | ICD-10-CM | POA: Diagnosis not present

## 2022-02-18 DIAGNOSIS — E1122 Type 2 diabetes mellitus with diabetic chronic kidney disease: Secondary | ICD-10-CM | POA: Diagnosis not present

## 2022-02-18 DIAGNOSIS — E039 Hypothyroidism, unspecified: Secondary | ICD-10-CM | POA: Diagnosis not present

## 2022-02-19 ENCOUNTER — Encounter: Payer: PPO | Admitting: Physician Assistant

## 2022-02-19 DIAGNOSIS — L97222 Non-pressure chronic ulcer of left calf with fat layer exposed: Secondary | ICD-10-CM | POA: Diagnosis not present

## 2022-02-19 DIAGNOSIS — L97822 Non-pressure chronic ulcer of other part of left lower leg with fat layer exposed: Secondary | ICD-10-CM | POA: Diagnosis not present

## 2022-02-19 NOTE — Progress Notes (Addendum)
[name] ([id number]) Visit Report for 02/19/2022 Chief Complaint Document Details Patient Name: [name], [name]. Date of Service: 02/19/2022 2:30 PM Medical Record Number: [id number] Patient Account Number: [id number] Date of Birth/Sex: [birth year] (81 y.o. M) Treating RN: Levora Dredge Primary Care Provider: Jenna Luo Other Clinician: Referring Provider: Jenna Luo Treating Provider/Extender: Skipper Cliche in Treatment: 59 Information Obtained from: Patient Chief Complaint Left LE Calciphylaxis and Glans Penis Calciphylaxis Electronic Signature(s) Signed: 02/19/2022 2:35:08 PM By: Worthy Keeler PA-C Entered By: Worthy Keeler on 02/19/2022 14:35:08 [name], [name] ([id number]) -------------------------------------------------------------------------------- HPI Details Patient Name: Mark Morale L. Date of Service: 02/19/2022 2:30 PM Medical Record Number: [id number] Patient Account Number: [id number] Date of Birth/Sex: 06/25/1941 (81 y.o. 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 This has been present for a couple of months currently he is on the sodium thiosulfate at dialysis. He tells me that they picked up on this quickly and have been managing it but he really has not had any help with anything else from the standpoint of progressing the wound itself. The patient does have again end-stage renal disease which currently is listed as stage IV but he is on renal dialysis. He also has coronary artery disease as well as going shortly for formal arterial studies although he did have a  quick test of the tibial artery with his vascular doctor when he went in to check on his fistula for his arm and subsequently they did not feel like he had any hemodynamically unstable flow into the lower extremity this is good news. Nonetheless I think that the biggest issue here is going to be getting some of the eschar off so that we get the wounds med headed in the appropriate direction. 05/31/2021 upon evaluation today patient appears to be doing well with regard to his leg ulceration with calciphylaxis. I am very pleased in this regard. Fortunately there does not appear to be any signs of active infection at this time which is great news. No fevers, chills, nausea, vomiting, or diarrhea. 06/07/2021 upon evaluation today patient appears to be doing well with regard to his legs. Unfortunately he is having an issue here with his penis as well he did mention at the end of last visit. I suggested that he go see urology. However when he went to see the urologist they basically told him there was not anything they could do. They recommended that we needed to be the ones to take care of this. With that being said this is an unusual area that to be honest a lot of the traditional wound care products we use are not really good to be good for this region. Potentially Medihoney alginate could be a possibility also think Santyl could be a possibility. With that being said I think initially my suggestion is probably can be for [place] to attempt the Santyl to see if that will benefit the patient. 06/21/2021 upon evaluation today patient's wound bed actually showed signs of good granulation and epithelization at this point. Fortunately I do not see any signs of active infection systemically which is great news and his legs are doing awesome I think you are making great progress. The Santyl on  the glans penis also has been of benefit this is looking tremendously better. Again the patient is pleased he also tells me  the pain is significantly improved. Overall I think we are headed in the appropriate direction here. 07/05/2021 upon evaluation today patient appears to be doing well with regard to his wounds. Both the area on the tip of his penis as well as the left lateral leg is doing well. There is some debridement this can be needed over the leg region. Fortunately I think that overall the Dakin's moistened gauze has done a great job here. No fevers, chills, nausea, vomiting, or diarrhea. 07/12/2021 upon evaluation today patient presents for reevaluation here in the clinic and overall I think that his wounds are doing significantly better which is great news. This includes both the left leg as well as the glans of the penis. Both are showing signs of significant improvement which is great and overall I think that we are headed in the right direction. I do not see any evidence of active infection at this moment. 07/19/2021 upon evaluation today patient appears to be doing better in regard to his wounds. Were getting much closer to complete resolution in regard to the necrotic tissue being removed from the leg. Overall I am extremely pleased with where we stand today. Overall I do not see any signs of active infection at this time. With regard to the patient's penis this area is showing signs of improvement as well and I am very pleased with that. 07/26/2021 upon evaluation today patient appears to be doing well with regard to his wounds. The leg in fact is looking quite well and I am very pleased with where we stand today. I do not see any signs of infection which is great and there is a lot of new granulation growth also also. With regard to the penis area this also showed signs of excellent improvement which is great news. 08/14/2020 upon evaluation today patient appears to be doing well with regard to his wounds both the leg as well as the glans penis region. Both are showing signs of improvement which is great  news and overall I am extremely pleased with where we stand at this point. I think that the Dakin's moistened gauze dressing for her leg is doing great and the Santyl is definitely helping with the glans penis location. 08/28/2021 upon evaluation today patient appears to be doing well with regard to his wounds on the left leg as well as the penis. Fortunately both are showing signs of improvement. I am going to perform some debridement in regard to the leg there is a small area which is having trouble getting completely clean my work on that a little bit more today. 09/04/2021 upon evaluation today patient appears to be doing pretty well in regard to his wounds. 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 Again he has I just found out today been off of the sodium thiosulfate for about 2 to 3 weeks. He tells me that the initial "3 weeks" was stated to be up by the nurse. With that being said I think he probably is going to need it for a bit longer to be honest. We discussed that today. Over the penis area the Santyl does seem to be loose and a lot of this up and that is good hopefully will be able  to get this completely cleared off it sometime shortly. 09/13/2021 upon evaluation today patient appears to be doing well with regard to his wound. He has been tolerating the dressing changes without complication. Fortunately there does not appear to be any signs of active infection locally or systemically which is great news. Overall I think that he is making progress with regard to his wounds in general. 09/25/2021 upon evaluation today patient appears to be doing excellent in regard to his wounds. I am actually extremely pleased with where we stand today and I think he is making great progress. The patient likewise is happy to hear this. 10/09/2021 upon evaluation today patient actually appears to be doing excellent  in regard to his wound. He has been tolerating the dressing changes without complication. Fortunately I do not see any evidence of active infection locally or systemically which is great news. No fevers, Sargeant, Aniken L. ([id number]) chills, nausea, vomiting, or diarrhea. 3/14; patient presents for follow-up. He has no issues or complaints today. 10/23/2021 upon evaluation today patient appears to be doing well with regard to his wounds. In fact the penis appears to be pretty much healed there may be a point when opening but not much at all to be perfectly honest. In regard to the leg I Flygt were definitely seeing signs of significant improvement which is great news. 10/30/2021 upon evaluation today patient appears to be doing well with regard to his wounds. In fact the penis area is completely healed which is great news that just the leg that were dealing with and this is significantly smaller. I am actually extremely pleased with where we stand. 11-06-2021 upon evaluation today patient appears to be doing well with regard to his leg ulcers. All are measuring smaller and looking great. I do not see any signs of infection which is great news. No fever chills noted 11-13-2021 upon evaluation today patient appears to be doing well with regard to his wounds everything is still measuring smaller and looking excellent. I am extremely pleased with where we stand today. There does not appear to be any signs of active infection at this time. No fevers, chills, nausea, vomiting, or diarrhea. 11-20-2021 upon evaluation today patient actually appears to be doing quite well in regard to his wounds. Has been tolerating the dressing changes without complication. Fortunately I do not see any evidence of active infection locally or systemically at this time which is great news. No fevers, chills, nausea, vomiting, or diarrhea. I think were definitely headed in the right direction here. 12-04-2021 upon evaluation today  patient appears to be doing well with regard to his wounds. Overall things seem to be measuring smaller and doing much better. Fortunately there does not appear to be any evidence of active infection locally nor systemically which is great news. No fevers, chills, nausea, vomiting, or diarrhea. 12/11/2021 upon evaluation today patient actually appears to be doing great in regard to his wounds he has been tolerating the dressing changes without complication. Fortunately there does not appear to be any evidence of active infection locally or systemically which is great news. No fevers, chills, nausea, vomiting, or diarrhea. 12-25-2021 upon evaluation today patient appears to be doing excellent in regard to his leg ulcers he has been tolerating the dressing changes and fortunately I do not see any signs of active infection locally or systemically at this time which is great news. Overall I do believe that we are headed in the appropriate direction here. I do not  see any evidence that this is worsening in general which is great news. 01-08-2022 upon evaluation today patient's leg is almost completely closed on the lateral aspect of the posterior is doing awesome. I do not see any evidence of infection and overall I think he is really making excellent progress here. Overall I think we are on the right track. 01-24-2022 upon evaluation today patient's wound is actually showing signs of excellent improvement he did have 1 small area that reopened it looks as if he may have bumped this on something I am not 100% sure. With that being said he overall seems to be doing quite well and I am very pleased with where things stand currently I see no signs of active infection at this time which is great news. 02-07-2022 upon evaluation today patient is making excellent progress his wounds are measuring smaller looking much better and overall I am extremely pleased with where things stand currently. I do not see any evidence  of active infection locally or systemically which is great news. Upon inspection patient's wound bed actually showed signs of good granulation and epithelization at this point. Fortunately I think that he is doing awesome with regard to his healing and I am very pleased in that regard. I think that we are on the right track. 02-19-2022 upon evaluation today patient appears to be doing well currently in regard to her wound. She has been tolerating the dressing changes without complication. Fortunately there does not appear to be any evidence of active infection locally or systemically which is great news. No fevers, chills, nausea, vomiting, or diarrhea. Electronic Signature(s) Signed: 02/19/2022 3:00:45 PM By: Worthy Keeler PA-C Entered By: Worthy Keeler on 02/19/2022 15:00:45 Fleming, [name] ([id number]) -------------------------------------------------------------------------------- Physical Exam Details Patient Name: [name], [name] Date of Service: 02/19/2022 2:30 PM Medical Record Number: [id number] Patient Account Number: [id number] Date of Birth/Sex: [birth year] (81 y.o. M) Treating RN: Levora Dredge Primary Care Provider: Jenna Luo Other Clinician: Referring Provider: Jenna Luo Treating Provider/Extender: Skipper Cliche in Treatment: 81 Constitutional Well-nourished and well-hydrated in no acute distress. Respiratory normal breathing without difficulty. Psychiatric this patient is able to make decisions and demonstrates good insight into disease process. Alert and Oriented x 3. pleasant and cooperative. Notes Upon inspection patient's wound bed actually showed signs of good granulation and epithelization at this point. Fortunately I do not see any evidence of infection at this time which is great news and overall I am extremely pleased with where we stand. Electronic Signature(s) Signed: 02/19/2022 3:01:01 PM By: Worthy Keeler PA-C Entered By: Worthy Keeler on 02/19/2022 15:01:00 Montone, [name] ([id number]) -------------------------------------------------------------------------------- Physician Orders Details Patient Name: Mark Morale L. Date of Service: 02/19/2022 2:30 PM Medical Record Number: [id number] Patient Account Number: [id number] Date of Birth/Sex: 02-12-1941 (81 y.o. 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 Edema Control - Lymphedema / Segmental Compressive Device / Other o Elevate, Exercise Daily and Avoid  Standing for Long Periods of Time. o Elevate legs to the level of the heart and pump ankles as often as possible o Elevate leg(s) parallel to the floor when sitting. Medications-Please add to medication list. o Other: - AandD ointment to leg Wound Treatment Wound #1 - Lower Leg Wound Laterality: Left, Posterior Cleanser: Dakin 16 (oz) 0.25 1 x Per Day/30 Days Discharge Instructions: Use as directed. Primary Dressing: Gauze 1 x Per Day/30 Days Discharge Instructions: As directed: moistened with Dakins Solution Secondary Dressing: Kerlix 4.5 x 4.1 (in/yd) (Generic) 1 x Per Day/30 Days Discharge Instructions: Apply Kerlix 4.5 x 4.1 (in/yd) as instructed Secured With: Medipore Tape - 74M Medipore H Soft Cloth Surgical Tape, 2x2 (in/yd) (Generic) 1  x Per Day/30 Days Electronic Signature(s) Signed: 02/19/2022 4:07:43 PM By: Levora Dredge Signed: 02/19/2022 5:53:34 PM By: Worthy Keeler PA-C Entered By: Levora Dredge on 02/19/2022 14:53:30 [name], [name] ([id number]) -------------------------------------------------------------------------------- Problem List Details Patient Name: [name], [name] Date of Service: 02/19/2022 2:30 PM Medical Record Number: [id number] Patient Account Number: [id number] Date of Birth/Sex: 03-23-1941 (81 y.o. M) Treating RN: Levora Dredge Primary Care Provider: Jenna Luo Other Clinician: Referring Provider: Jenna Luo Treating Provider/Extender: Skipper Cliche in Treatment: 90 Active Problems ICD-10 Encounter Code Description Active Date MDM Diagnosis E83.50 Unspecified disorder of calcium metabolism 05/24/2021 No Yes L97.822 Non-pressure chronic ulcer of other part of left lower leg with fat layer 05/24/2021 No Yes exposed L98.492 Non-pressure chronic ulcer of skin of other sites with fat layer exposed 06/07/2021 No Yes Z99.2 Dependence on renal dialysis 05/24/2021 No Yes N18.4 Chronic kidney disease, stage 4 (severe) 05/24/2021 No Yes I25.10 Atherosclerotic heart disease of native coronary artery without angina 05/24/2021 No Yes pectoris Inactive Problems Resolved Problems Electronic Signature(s) Signed: 02/19/2022 2:35:03 PM By: Worthy Keeler PA-C Entered By: Worthy Keeler on 02/19/2022 14:35:02 [name], [name] ([id number]) -------------------------------------------------------------------------------- Progress Note Details Patient Name: [name], [name] Date of Service: 02/19/2022 2:30 PM Medical Record Number: [id number] Patient Account Number: [id number] Date of Birth/Sex: October 30, 1940 (81 y.o. 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 This has been present for a couple of months currently he is on the sodium thiosulfate at dialysis. He tells me that they picked up on this quickly and have been managing it but he really has not had any help with anything else from the standpoint of progressing the wound itself. The patient does have again end-stage renal disease which currently is listed as stage IV but he is on renal dialysis. He also has coronary artery disease as well as going shortly for formal arterial studies although he did have a quick test of the tibial artery with his vascular doctor when he went in to check on his fistula for his arm and subsequently they did not feel like he had any hemodynamically unstable flow into the lower extremity this is good news. Nonetheless I think that the biggest issue here is going to be getting some of the eschar off so that we get the wounds med headed in the appropriate direction. 05/31/2021 upon evaluation today patient appears to be doing well  with regard to his leg ulceration with calciphylaxis. I am very pleased in this regard. Fortunately there does not appear to be any signs of active infection at this time which is great news. No fevers, chills, nausea, vomiting, or diarrhea. 06/07/2021 upon evaluation today patient appears to be doing well with regard to his legs. Unfortunately he is having an issue here with his penis as well he did mention at the end of last visit. I suggested that he go see urology. However when he went to see the urologist they basically told him there was not anything they could do. They recommended  that we needed to be the ones to take care of this. With that being said this is an unusual area that to be honest a lot of the traditional wound care products we use are not really good to be good for this region. Potentially Medihoney alginate could be a possibility also think Santyl could be a possibility. With that being said I think initially my suggestion is probably can be for [place] to attempt the Santyl to see if that will benefit the patient. 06/21/2021 upon evaluation today patient's wound bed actually showed signs of good granulation and epithelization at this point. Fortunately I do not see any signs of active infection systemically which is great news and his legs are doing awesome I think you are making great progress. The Santyl on the glans penis also has been of benefit this is looking tremendously better. Again the patient is pleased he also tells me the pain is significantly improved. Overall I think we are headed in the appropriate direction here. 07/05/2021 upon evaluation today patient appears to be doing well with regard to his wounds. Both the area on the tip of his penis as well as the left lateral leg is doing well. There is some debridement this can be needed over the leg region. Fortunately I think that overall the Dakin's moistened gauze has done a great job here. No fevers, chills, nausea, vomiting, or diarrhea. 07/12/2021 upon evaluation today patient presents for reevaluation here in the clinic and overall I think that his wounds are doing significantly better which is great news. This includes both the left leg as well as the glans of the penis. Both are showing signs of significant improvement which is great and overall I think that we are headed in the right direction. I do not see any evidence of active infection at this moment. 07/19/2021 upon evaluation today patient appears to be doing better in regard to his wounds. Were getting much closer to complete resolution  in regard to the necrotic tissue being removed from the leg. Overall I am extremely pleased with where we stand today. Overall I do not see any signs of active infection at this time. With regard to the patient's penis this area is showing signs of improvement as well and I am very pleased with that. 07/26/2021 upon evaluation today patient appears to be doing well with regard to his wounds. The leg in fact is looking quite well and I am very pleased with where we stand today. I do not see any signs of infection which is great and there is a lot of new granulation growth also also. With regard to the penis area this also showed signs of excellent improvement which is great news. 08/14/2020 upon evaluation today patient appears to be doing well with regard to his wounds both the leg as well as the glans penis region. Both are  showing signs of improvement which is great news and overall I am extremely pleased with where we stand at this point. I think that the Dakin's moistened gauze dressing for her leg is doing great and the Santyl is definitely helping with the glans penis location. 08/28/2021 upon evaluation today patient appears to be doing well with regard to his wounds on the left leg as well as the penis. Fortunately both are showing signs of improvement. I am going to perform some debridement in regard to the leg there is a small area which is having trouble getting completely clean my work on that a little bit more today. 09/04/2021 upon evaluation today patient appears to be doing pretty well in regard to his wounds. 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 Again he has I just found out today been off of the sodium thiosulfate for about 2 to 3 weeks. He tells me that the initial "3 weeks" was stated to be up by the nurse. With that being said I think he probably is going to need it for a bit  longer to be honest. We discussed that today. Over the penis area the Santyl does seem to be loose and a lot of this up and that is good hopefully will be able to get this completely cleared off it sometime shortly. 09/13/2021 upon evaluation today patient appears to be doing well with regard to his wound. He has been tolerating the dressing changes without complication. Fortunately there does not appear to be any signs of active infection locally or systemically which is great news. Overall I think that he is making progress with regard to his wounds in general. [name], [name] ([id number]) 09/25/2021 upon evaluation today patient appears to be doing excellent in regard to his wounds. I am actually extremely pleased with where we stand today and I think he is making great progress. The patient likewise is happy to hear this. 10/09/2021 upon evaluation today patient actually appears to be doing excellent in regard to his wound. He has been tolerating the dressing changes without complication. Fortunately I do not see any evidence of active infection locally or systemically which is great news. No fevers, chills, nausea, vomiting, or diarrhea. 3/14; patient presents for follow-up. He has no issues or complaints today. 10/23/2021 upon evaluation today patient appears to be doing well with regard to his wounds. In fact the penis appears to be pretty much healed there may be a point when opening but not much at all to be perfectly honest. In regard to the leg I Flygt were definitely seeing signs of significant improvement which is great news. 10/30/2021 upon evaluation today patient appears to be doing well with regard to his wounds. In fact the penis area is completely healed which is great news that just the leg that were dealing with and this is significantly smaller. I am actually extremely pleased with where we stand. 11-06-2021 upon evaluation today patient appears to be doing well with regard to his leg  ulcers. All are measuring smaller and looking great. I do not see any signs of infection which is great news. No fever chills noted 11-13-2021 upon evaluation today patient appears to be doing well with regard to his wounds everything is still measuring smaller and looking excellent. I am extremely pleased with where we stand today. There does not appear to be  any signs of active infection at this time. No fevers, chills, nausea, vomiting, or diarrhea. 11-20-2021 upon evaluation today patient actually appears to be doing quite well in regard to his wounds. Has been tolerating the dressing changes without complication. Fortunately I do not see any evidence of active infection locally or systemically at this time which is great news. No fevers, chills, nausea, vomiting, or diarrhea. I think were definitely headed in the right direction here. 12-04-2021 upon evaluation today patient appears to be doing well with regard to his wounds. Overall things seem to be measuring smaller and doing much better. Fortunately there does not appear to be any evidence of active infection locally nor systemically which is great news. No fevers, chills, nausea, vomiting, or diarrhea. 12/11/2021 upon evaluation today patient actually appears to be doing great in regard to his wounds he has been tolerating the dressing changes without complication. Fortunately there does not appear to be any evidence of active infection locally or systemically which is great news. No fevers, chills, nausea, vomiting, or diarrhea. 12-25-2021 upon evaluation today patient appears to be doing excellent in regard to his leg ulcers he has been tolerating the dressing changes and fortunately I do not see any signs of active infection locally or systemically at this time which is great news. Overall I do believe that we are headed in the appropriate direction here. I do not see any evidence that this is worsening in general which is great news. 01-08-2022  upon evaluation today patient's leg is almost completely closed on the lateral aspect of the posterior is doing awesome. I do not see any evidence of infection and overall I think he is really making excellent progress here. Overall I think we are on the right track. 01-24-2022 upon evaluation today patient's wound is actually showing signs of excellent improvement he did have 1 small area that reopened it looks as if he may have bumped this on something I am not 100% sure. With that being said he overall seems to be doing quite well and I am very pleased with where things stand currently I see no signs of active infection at this time which is great news. 02-07-2022 upon evaluation today patient is making excellent progress his wounds are measuring smaller looking much better and overall I am extremely pleased with where things stand currently. I do not see any evidence of active infection locally or systemically which is great news. Upon inspection patient's wound bed actually showed signs of good granulation and epithelization at this point. Fortunately I think that he is doing awesome with regard to his healing and I am very pleased in that regard. I think that we are on the right track. 02-19-2022 upon evaluation today patient appears to be doing well currently in regard to her wound. She has been tolerating the dressing changes without complication. Fortunately there does not appear to be any evidence of active infection locally or systemically which is great news. No fevers, chills, nausea, vomiting, or diarrhea. Objective Constitutional Well-nourished and well-hydrated in no acute distress. Vitals Time Taken: 2:30 PM, Height: 68 in, Weight: 172 lbs, BMI: 26.1, Temperature: 98.2 F, Pulse: 57 bpm, Respiratory Rate: 18 breaths/min, Blood Pressure: 121/56 mmHg. Respiratory normal breathing without difficulty. Psychiatric this patient is able to make decisions and demonstrates good insight into  disease process. Alert and Oriented x 3. pleasant and cooperative. [name], [name] ([id number]) General Notes: Upon inspection patient's wound bed actually showed signs of good granulation and epithelization  at this point. Fortunately I do not see any evidence of infection at this time which is great news and overall I am extremely pleased with where we stand. Integumentary (Hair, Skin) Wound #1 status is Open. Original cause of wound was Gradually Appeared. The date acquired was: 04/05/2021. The wound has been in treatment 38 weeks. The wound is located on the Left,Posterior Lower Leg. The wound measures 3cm length x 2.5cm width x 0.1cm depth; 5.89cm^2 area and 0.589cm^3 volume. There is Fat Layer (Subcutaneous Tissue) exposed. There is no tunneling or undermining noted. There is a medium amount of serosanguineous drainage noted. There is large (67-100%) red, hyper - granulation within the wound bed. There is a small (1-33%) amount of necrotic tissue within the wound bed including Adherent Slough. Wound #3 status is Open. Original cause of wound was Not Known. The date acquired was: 01/20/2022. The wound has been in treatment 3 weeks. The wound is located on the Left,Anterior Lower Leg. The wound measures 0cm length x 0cm width x 0cm depth; 0cm^2 area and 0cm^3 volume. There is no tunneling or undermining noted. There is a none present amount of drainage noted. There is no granulation within the wound bed. There is no necrotic tissue within the wound bed. 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 Edema Control - Lymphedema / Segmental Compressive Device / Other: Elevate, Exercise Daily and Avoid Standing for Long Periods of Time. Elevate legs to the level of the heart and pump ankles as often as possible Elevate leg(s) parallel to the floor when sitting. Medications-Please add to medication list.: Other: - AandD ointment to leg WOUND #1: - Lower Leg Wound Laterality: Left, Posterior Cleanser: Dakin 16 (oz) 0.25 1 x Per Day/30 Days Discharge Instructions: Use as directed. Primary Dressing: Gauze 1 x Per Day/30 Days Discharge Instructions: As directed: moistened with Dakins Solution Secondary Dressing: Kerlix 4.5 x 4.1 (in/yd) (Generic) 1 x Per Day/30 Days Discharge Instructions: Apply Kerlix 4.5 x 4.1 (in/yd) as instructed Secured With: Medipore Tape - 107M Medipore H Soft Cloth Surgical Tape, 2x2 (in/yd) (Generic) 1 x Per Day/30 Days 1. Based on what I am seeing I do believe that we will get recommend that the patient going continue with the wound care measures as before and the patient is in agreement with plan this includes the use of the Dakin's solution which I believe is doing quite well. 2. I am also can recommend that we continue with the roll gauze to secure in place. 3. I am going to recommend that he continue to change this daily his wife doing a great job with that. We will see patient back for reevaluation in 2 weeks here in the clinic. If anything worsens or changes patient will contact our office for additional recommendations. Electronic Signature(s) Signed: 02/19/2022 3:01:53 PM By: Worthy Keeler PA-C Entered By: Worthy Keeler on 02/19/2022 15:01:52 [name], [place]. ([id number]) -------------------------------------------------------------------------------- SuperBill Details Patient Name: Mark Morale L. Date of Service: 02/19/2022 Medical Record Number: [id number] Patient Account Number: [id number] Date of Birth/Sex: 30-Dec-1940 (81 y.o. M) Treating  RN: Levora Dredge Primary Care Provider: Jenna Luo Other Clinician: Referring Provider: Jenna Luo Treating  Provider/Extender: Jeri Cos Weeks in Treatment: 38 Diagnosis Coding ICD-10 Codes Code Description E83.50 Unspecified disorder of calcium metabolism L97.822 Non-pressure chronic ulcer of other part of left lower leg with fat layer exposed L98.492 Non-pressure chronic ulcer of skin of other sites with fat layer exposed Z99.2 Dependence on renal dialysis N18.4 Chronic kidney disease, stage 4 (severe) I25.10 Atherosclerotic heart disease of native coronary artery without angina pectoris Facility Procedures CPT4 Code: [id number] Description: 99213 - WOUND CARE VISIT-LEV 3 EST PT Modifier: Quantity: 1 Physician Procedures CPT4 Code: [id number] Description: 84665 - WC PHYS LEVEL 3 - EST PT Modifier: Quantity: 1 CPT4 Code: Description: ICD-10 Diagnosis Description E83.50 Unspecified disorder of calcium metabolism L97.822 Non-pressure chronic ulcer of other part of left lower leg with fat lay L98.492 Non-pressure chronic ulcer of skin of other sites with fat layer expose  Z99.2 Dependence on renal dialysis Modifier: er exposed d Quantity: Electronic Signature(s) Signed: 02/19/2022 3:02:07 PM By: Worthy Keeler PA-C Entered By: Worthy Keeler on 02/19/2022 15:02:07

## 2022-02-19 NOTE — Progress Notes (Signed)
GARRETTE, CAINE ([id number]) Visit Report for 02/19/2022 Arrival Information Details Patient Name: [name], [name]. Date of Service: 02/19/2022 2:30 PM Medical Record Number: [id number] Patient Account Number: [id number] Date of Birth/Sex: 1940-10-24 (81 y.o. M) Treating RN: Levora Dredge Primary Care Nesanel Aguila: Jenna Luo Other Clinician: Referring Shaneisha Burkel: Jenna Luo Treating Monick Rena/Extender: Skipper Cliche in Treatment: 30 Visit Information History Since Last Visit Added or deleted any medications: No Patient Arrived: [name] Any new allergies or adverse reactions: No Arrival Time: 14:30 Had a fall or experienced change in No Accompanied By: wife activities of daily living that may affect Transfer Assistance: None risk of falls: Patient Identification Verified: Yes Hospitalized since last visit: No Secondary Verification Process Completed: Yes Has Dressing in Place as Prescribed: Yes Patient Requires Transmission-Based Precautions: No Pain Present Now: No Patient Has Alerts: No Electronic Signature(s) Signed: 02/19/2022 4:07:43 PM By: Levora Dredge Entered By: Levora Dredge on 02/19/2022 [place], [place]. ([id number]) -------------------------------------------------------------------------------- Clinic Level of Care Assessment Details Patient Name: Mark Morale L. Date of Service: 02/19/2022 2:30 PM Medical Record Number: [id number] Patient Account Number: [id number] Date of Birth/Sex: 11/25/1940 (81 y.o. M) Treating RN: Levora Dredge Primary Care Abdelrahman Nair: Jenna Luo Other Clinician: Referring Breyden Jeudy: Jenna Luo Treating Ladarrion Telfair/Extender: Skipper Cliche in Treatment: 75 Clinic Level of Care Assessment Items TOOL 4 Quantity Score []  - Use when only an EandM is performed on FOLLOW-UP visit 0 ASSESSMENTS - Nursing Assessment / Reassessment X - Reassessment of Co-morbidities (includes updates in patient status) 1 10 X- 1  5 Reassessment of Adherence to Treatment Plan ASSESSMENTS - Wound and Skin Assessment / Reassessment X - Simple Wound Assessment / Reassessment - one wound 1 5 []  - 0 Complex Wound Assessment / Reassessment - multiple wounds []  - 0 Dermatologic / Skin Assessment (not related to wound area) ASSESSMENTS - Focused Assessment X - Circumferential Edema Measurements - multi extremities 1 5 []  - 0 Nutritional Assessment / Counseling / Intervention []  - 0 Lower Extremity Assessment (monofilament, tuning fork, pulses) []  - 0 Peripheral Arterial Disease Assessment (using hand held doppler) ASSESSMENTS - Ostomy and/or Continence Assessment and Care []  - Incontinence Assessment and Management 0 []  - 0 Ostomy Care Assessment and Management (repouching, etc.) PROCESS - Coordination of Care X - Simple Patient / Family Education for ongoing care 1 15 []  - 0 Complex (extensive) Patient / Family Education for ongoing care []  - 0 Staff obtains Programmer, systems, Records, Test Results / Process Orders []  - 0 Staff telephones HHA, Nursing Homes / Clarify orders / etc []  - 0 Routine Transfer to another Facility (non-emergent condition) []  - 0 Routine Hospital Admission (non-emergent condition) []  - 0 New Admissions / Biomedical engineer / Ordering NPWT, Apligraf, etc. []  - 0 Emergency Hospital Admission (emergent condition) X- 1 10 Simple Discharge Coordination []  - 0 Complex (extensive) Discharge Coordination PROCESS - Special Needs []  - Pediatric / Minor Patient Management 0 []  - 0 Isolation Patient Management []  - 0 Hearing / Language / Visual special needs []  - 0 Assessment of Community assistance (transportation, D/C planning, etc.) []  - 0 Additional assistance / Altered mentation []  - 0 Support Surface(s) Assessment (bed, cushion, seat, etc.) INTERVENTIONS - Wound Cleansing / Measurement [name], [name] ([id number]) X- 1 5 Simple Wound Cleansing - one wound []  - 0 Complex Wound  Cleansing - multiple wounds X- 1 5 Wound Imaging (photographs - any number of wounds) []  - 0 Wound Tracing (instead of photographs) X- 1 5 Simple Wound Measurement -  one wound []  - 0 Complex Wound Measurement - multiple wounds INTERVENTIONS - Wound Dressings X - Small Wound Dressing one or multiple wounds 1 10 []  - 0 Medium Wound Dressing one or multiple wounds []  - 0 Large Wound Dressing one or multiple wounds []  - 0 Application of Medications - topical []  - 0 Application of Medications - injection INTERVENTIONS - Miscellaneous []  - External ear exam 0 []  - 0 Specimen Collection (cultures, biopsies, blood, body fluids, etc.) []  - 0 Specimen(s) / Culture(s) sent or taken to Lab for analysis []  - 0 Patient Transfer (multiple staff / Civil Service fast streamer / Similar devices) []  - 0 Simple Staple / Suture removal (25 or less) []  - 0 Complex Staple / Suture removal (26 or more) []  - 0 Hypo / Hyperglycemic Management (close monitor of Blood Glucose) []  - 0 Ankle / Brachial Index (ABI) - do not check if billed separately X- 1 5 Vital Signs Has the patient been seen at the hospital within the last three years: Yes Total Score: 80 Level Of Care: New/Established - Level 3 Electronic Signature(s) Signed: 02/19/2022 4:07:43 PM By: Levora Dredge Entered By: Levora Dredge on 02/19/2022 14:56:45 [name], [name] ([id number]) -------------------------------------------------------------------------------- Encounter Discharge Information Details Patient Name: Mark Morale L. Date of Service: 02/19/2022 2:30 PM Medical Record Number: [id number] Patient Account Number: [id number] Date of Birth/Sex: [birth year] (81 y.o. M) Treating RN: Levora Dredge Primary Care Jeancarlo Leffler: Jenna Luo Other Clinician: Referring Deamonte Sayegh: Jenna Luo Treating Venesa Semidey/Extender: Skipper Cliche in Treatment: 48 Encounter Discharge Information Items Discharge Condition: Stable Ambulatory Status:  Walker Discharge Destination: Home Transportation: Private Auto Accompanied By: self Schedule Follow-up Appointment: Yes Clinical Summary of Care: Electronic Signature(s) Signed: 02/19/2022 4:07:43 PM By: Levora Dredge Entered By: Levora Dredge on 02/19/2022 14:57:34 [name], [name] ([id number]) -------------------------------------------------------------------------------- Lower Extremity Assessment Details Patient Name: [name], [name] Date of Service: 02/19/2022 2:30 PM Medical Record Number: [id number] Patient Account Number: [id number] Date of Birth/Sex: August 05, 1941 (81 y.o. M) Treating RN: Levora Dredge Primary Care Kaisey Huseby: Jenna Luo Other Clinician: Referring Kiaria Quinnell: Jenna Luo Treating [facility]/Extender: Skipper Cliche in Treatment: 38 Edema Assessment Assessed: [Left: No] [Right: No] Edema: [Left: Ye] [Right: s] Calf Left: Right: Point of Measurement: 30 cm From Medial Instep 36.4 cm Ankle Left: Right: Point of Measurement: 10 cm From Medial Instep 20 cm Vascular Assessment Pulses: Dorsalis Pedis Palpable: [Left:Yes] Electronic Signature(s) Signed: 02/19/2022 4:07:43 PM By: Levora Dredge Entered By: Levora Dredge on 02/19/2022 14:40:37 [place], Copelan L. ([id number]) -------------------------------------------------------------------------------- Multi Wound Chart Details Patient Name: Mark Morale L. Date of Service: 02/19/2022 2:30 PM Medical Record Number: [id number] Patient Account Number: [id number] Date of Birth/Sex: [birth year] (81 y.o. 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 Comorbid History: Coronary Artery Disease, Type II Coronary Artery Disease, Type II N/A Diabetes Diabetes Date Acquired: 04/05/2021 01/20/2022 N/A Weeks of Treatment: 38 3 N/A Wound Status: Open Open N/A Wound Recurrence: No No N/A Clustered Wound: Yes No N/A Clustered Quantity: 3 N/A N/A Measurements L x W x D (cm) 3x2.5x0.1 0x0x0 N/A Area (cm) : 5.89 0 N/A Volume (cm) : 0.589  0 N/A % Reduction in Area: 96.60% 100.00% N/A % Reduction in Volume: 99.10% 100.00% N/A Classification: Full Thickness Without Exposed Grade 1 N/A Support Structures Exudate Amount: Medium None Present N/A Exudate Type: Serosanguineous N/A N/A Exudate Color: red, brown N/A N/A Granulation Amount: Large (67-100%) None Present (0%) N/A Granulation Quality: Red, Hyper-granulation N/A N/A Necrotic Amount: Small (1-33%) None Present (0%) N/A Exposed Structures: Fat Layer (Subcutaneous Tissue): Fascia: No N/A Yes Fat Layer (Subcutaneous Tissue): Fascia: No No Tendon: No Tendon: No Muscle: No Joint: No Joint: No Bone: No Bone: No Epithelialization: Small (1-33%) Large (67-100%) N/A Treatment Notes Electronic Signature(s) Signed: 02/19/2022 4:07:43 PM By: Levora Dredge Entered By: Levora Dredge on 02/19/2022 14:47:19 [name], [name] ([id number]) [name], [name] ([id number]) -------------------------------------------------------------------------------- [place] Details Patient Name: [name], Daishawn L. Date of Service: 02/19/2022 2:30 PM Medical Record Number: [id number] Patient Account Number: [id number] Date of Birth/Sex: 1941/01/29 (81 y.o. M) Treating RN: Levora Dredge Primary Care Ravleen Ries: Jenna Luo Other Clinician: Referring Kimaya Whitlatch: Jenna Luo Treating Charo Philipp/Extender: Skipper Cliche in Treatment: 18 Active Inactive Wound/Skin Impairment Nursing Diagnoses: Knowledge deficit related to ulceration/compromised skin integrity Goals: Patient/caregiver will verbalize understanding of skin care regimen Date Initiated: 05/24/2021 Target Resolution Date: 01/08/2022 Goal Status: Active Ulcer/skin breakdown will have a volume reduction of 30% by week 4 Date Initiated: 05/24/2021 Date Inactivated: 08/14/2021 Target Resolution Date: 07/24/2021 Goal Status: Unmet Unmet Reason: comorbites Ulcer/skin breakdown will have a volume reduction of 50% by week 8 Date Initiated: 05/24/2021 Date Inactivated: 09/25/2021 Target Resolution Date: 08/24/2021 Goal Status: Unmet Unmet Reason: comorbities Ulcer/skin breakdown will have a volume reduction of 80% by week 12 Date Initiated: 05/24/2021 Date Inactivated: 09/25/2021 Target Resolution Date: 09/24/2021 Goal Status: Unmet Unmet Reason: comorbities Ulcer/skin breakdown will heal within 14 weeks Date Initiated: 05/24/2021 Date Inactivated: 10/30/2021 Target Resolution Date: 10/22/2021 Goal Status: Unmet Unmet Reason: comorbities Interventions: Assess patient/caregiver ability to obtain necessary supplies Assess patient/caregiver ability to perform ulcer/skin care regimen upon admission and as needed Assess ulceration(s) every visit Notes: Electronic Signature(s) Signed: 02/19/2022 4:07:43 PM By: Levora Dredge Entered By: Levora Dredge on 02/19/2022 14:47:09 [name], [name] ([id number]) -------------------------------------------------------------------------------- Pain Assessment Details Patient Name: Mark Morale L. Date of Service: 02/19/2022 2:30 PM Medical Record Number: [id number] Patient Account Number: [id number] Date of Birth/Sex: 24-Jan-1941 (81 y.o. M) Treating RN: Levora Dredge Primary Care Timisha Mondry: Jenna Luo Other Clinician: Referring Nalleli Largent:  Jenna Luo Treating Farin Buhman/Extender: Skipper Cliche in Treatment: 6 Active Problems Location of Pain Severity and Description of Pain Patient Has Paino No Site Locations Rate the pain. Current Pain Level: 0 Pain Management and Medication Current Pain Management: Electronic Signature(s) Signed: 02/19/2022 4:07:43 PM By: Levora Dredge Entered By: Levora Dredge on 02/19/2022 14:34:26 [name], [name] ([id number]) -------------------------------------------------------------------------------- Patient/Caregiver Education Details Patient Name: Mark Morale L. Date of Service: 02/19/2022 2:30 PM Medical Record Number: [id number] Patient Account Number: [id number] Date of Birth/Gender: [birth year] (81 y.o. M) Treating RN: Levora Dredge Primary Care Physician: Jenna Luo Other Clinician: Referring Physician: Jenna Luo Treating Physician/Extender: Skipper Cliche in Treatment: 43 Education Assessment Education Provided To: Patient Education Topics Provided Wound/Skin Impairment: Handouts: Caring for Your Ulcer Methods: Explain/Verbal Responses: State content correctly Electronic Signature(s) Signed: 02/19/2022 4:07:43 PM By: Levora Dredge Entered By: Levora Dredge on 02/19/2022 14:56:59 [name], [name] ([id number]) -------------------------------------------------------------------------------- Wound Assessment Details Patient Name: [name], [name] Date of Service: 02/19/2022 2:30 PM Medical Record Number: [id number] Patient Account Number: [id number] Date of Birth/Sex: 1941-01-08 (81 y.o. M) Treating RN: Araceli Bouche,  [place] Primary [facility] [place]: Jenna Luo Other Clinician: Referring Harris Penton: Jenna Luo Treating Marquell Saenz/Extender: Skipper Cliche in Treatment: 38 Wound Status Wound Number: 1 Primary Etiology: Calciphylaxis Wound Location: Left, Posterior Lower Leg Wound Status: Open Wounding Event: Gradually Appeared Comorbid  History: Coronary Artery Disease, Type II Diabetes Date Acquired: 04/05/2021 Weeks Of Treatment: 38 Clustered Wound: Yes Photos Wound Measurements Length: (cm) 3 Width: (cm) 2.5 Depth: (cm) 0.1 Clustered Quantity: 3 Area: (cm) 5.89 Volume: (cm) 0.589 % Reduction in Area: 96.6% % Reduction in Volume: 99.1% Epithelialization: Small (1-33%) Tunneling: No Undermining: No Wound Description Classification: Full Thickness Without Exposed Support Structu Exudate Amount: Medium Exudate Type: Serosanguineous Exudate Color: red, brown res Foul Odor After Cleansing: No Slough/Fibrino Yes Wound Bed Granulation Amount: Large (67-100%) Exposed Structure Granulation Quality: Red, Hyper-granulation Fascia Exposed: No Necrotic Amount: Small (1-33%) Fat Layer (Subcutaneous Tissue) Exposed: Yes Necrotic Quality: Adherent Slough Tendon Exposed: No Muscle Exposed: No Joint Exposed: No Bone Exposed: No Treatment Notes Wound #1 (Lower Leg) Wound Laterality: Left, Posterior Cleanser Dakin 16 (oz) 0.25 Discharge Instruction: Use as directed. Peri-Wound Care [name], CERONE ([id number]) Topical Primary Dressing Gauze Discharge Instruction: As directed: moistened with Dakins Solution Secondary Dressing Kerlix 4.5 x 4.1 (in/yd) Discharge Instruction: Apply Kerlix 4.5 x 4.1 (in/yd) as instructed Secured With [place] H Soft Cloth Surgical Tape, 2x2 (in/yd) Compression Wrap Compression Stockings Add-Ons Electronic Signature(s) Signed: 02/19/2022 4:07:43 PM By: Levora Dredge Entered By: Levora Dredge on 02/19/2022 14:40:12 [place], [place] L. ([id number]) -------------------------------------------------------------------------------- Wound Assessment Details Patient Name: Amenta, Walker L. Date of Service: 02/19/2022 2:30 PM Medical Record Number: [id number] Patient Account Number: [id number] Date of Birth/Sex: [birth year] (81 y.o. M) Treating RN: Levora Dredge Primary Care Dominyk Law: Jenna Luo Other Clinician: Referring Romey Mathieson: Jenna Luo Treating Jerusha Reising/Extender: Skipper Cliche in Treatment: 38 Wound Status Wound Number: 3 Primary Etiology: Diabetic Wound/Ulcer of the Lower Extremity Wound Location: Left, Anterior Lower Leg Wound Status: Open Wounding Event: Not Known Comorbid History: Coronary Artery Disease, Type II Diabetes Date Acquired: 01/20/2022 Weeks Of Treatment: 3 Clustered Wound: No Wound Measurements Length: (cm) 0 Width: (cm) 0 Depth: (cm) 0 Area: (cm) 0 Volume: (cm) 0 % Reduction in Area: 100% % Reduction in Volume: 100% Epithelialization: Large (67-100%) Tunneling: No Undermining: No Wound Description Classification: Grade 1 Exudate Amount: None Present Foul Odor After Cleansing: No Slough/Fibrino No Wound Bed Granulation Amount: None Present (0%) Exposed Structure Necrotic Amount: None Present (0%) Fascia Exposed: No Fat Layer (Subcutaneous Tissue) Exposed: No Tendon Exposed: No Joint Exposed: No Bone Exposed: No Electronic Signature(s) Signed: 02/19/2022 4:07:43 PM By: Levora Dredge Entered By: Levora Dredge on 02/19/2022 14:39:40 Hoh, Michale L. ([id number]) -------------------------------------------------------------------------------- Vitals Details Patient Name: Mark Morale L. Date of Service: 02/19/2022 2:30 PM Medical Record Number: [id number] Patient Account Number: [id number] Date of Birth/Sex: 27-Feb-1941 (81 y.o. M) Treating RN: Levora Dredge Primary Care Anagha Loseke: Jenna Luo Other Clinician: Referring Kamdyn Colborn: Jenna Luo Treating Davan Hark/Extender: Skipper Cliche in Treatment: 16 Vital Signs Time Taken: 14:30 Temperature (F): 98.2 Height (in): 68 Pulse (bpm): 57 Weight (lbs): 172 Respiratory Rate (breaths/min): 18 Body Mass Index (BMI): 26.1 Blood Pressure (mmHg): 121/56 Reference Range: 80 - 120 mg / dl Electronic  Signature(s) Signed: 02/19/2022 4:07:43 PM By: Levora Dredge Entered By: Levora Dredge on 02/19/2022 14:33:06

## 2022-02-25 DIAGNOSIS — Z992 Dependence on renal dialysis: Secondary | ICD-10-CM | POA: Diagnosis not present

## 2022-02-25 DIAGNOSIS — N2581 Secondary hyperparathyroidism of renal origin: Secondary | ICD-10-CM | POA: Diagnosis not present

## 2022-02-25 DIAGNOSIS — D631 Anemia in chronic kidney disease: Secondary | ICD-10-CM | POA: Diagnosis not present

## 2022-02-25 DIAGNOSIS — D689 Coagulation defect, unspecified: Secondary | ICD-10-CM | POA: Diagnosis not present

## 2022-02-25 DIAGNOSIS — N186 End stage renal disease: Secondary | ICD-10-CM | POA: Diagnosis not present

## 2022-02-25 DIAGNOSIS — E876 Hypokalemia: Secondary | ICD-10-CM | POA: Diagnosis not present

## 2022-02-25 DIAGNOSIS — R110 Nausea: Secondary | ICD-10-CM | POA: Diagnosis not present

## 2022-02-26 DIAGNOSIS — N186 End stage renal disease: Secondary | ICD-10-CM | POA: Diagnosis not present

## 2022-02-26 DIAGNOSIS — Z992 Dependence on renal dialysis: Secondary | ICD-10-CM | POA: Diagnosis not present

## 2022-02-26 DIAGNOSIS — I771 Stricture of artery: Secondary | ICD-10-CM | POA: Diagnosis not present

## 2022-02-26 DIAGNOSIS — T82858A Stenosis of vascular prosthetic devices, implants and grafts, initial encounter: Secondary | ICD-10-CM | POA: Diagnosis not present

## 2022-02-27 DIAGNOSIS — N189 Chronic kidney disease, unspecified: Secondary | ICD-10-CM | POA: Diagnosis not present

## 2022-02-27 DIAGNOSIS — N186 End stage renal disease: Secondary | ICD-10-CM | POA: Diagnosis not present

## 2022-02-27 DIAGNOSIS — A419 Sepsis, unspecified organism: Secondary | ICD-10-CM | POA: Diagnosis not present

## 2022-02-27 DIAGNOSIS — G9341 Metabolic encephalopathy: Secondary | ICD-10-CM | POA: Diagnosis not present

## 2022-03-04 DIAGNOSIS — E1122 Type 2 diabetes mellitus with diabetic chronic kidney disease: Secondary | ICD-10-CM | POA: Diagnosis not present

## 2022-03-04 DIAGNOSIS — Z992 Dependence on renal dialysis: Secondary | ICD-10-CM | POA: Diagnosis not present

## 2022-03-04 DIAGNOSIS — N186 End stage renal disease: Secondary | ICD-10-CM | POA: Diagnosis not present

## 2022-03-04 DIAGNOSIS — N2581 Secondary hyperparathyroidism of renal origin: Secondary | ICD-10-CM | POA: Diagnosis not present

## 2022-03-04 DIAGNOSIS — D689 Coagulation defect, unspecified: Secondary | ICD-10-CM | POA: Diagnosis not present

## 2022-03-05 ENCOUNTER — Encounter: Payer: PPO | Attending: Physician Assistant | Admitting: Physician Assistant

## 2022-03-05 DIAGNOSIS — E1151 Type 2 diabetes mellitus with diabetic peripheral angiopathy without gangrene: Secondary | ICD-10-CM | POA: Insufficient documentation

## 2022-03-05 DIAGNOSIS — L98492 Non-pressure chronic ulcer of skin of other sites with fat layer exposed: Secondary | ICD-10-CM | POA: Diagnosis not present

## 2022-03-05 DIAGNOSIS — N186 End stage renal disease: Secondary | ICD-10-CM | POA: Diagnosis not present

## 2022-03-05 DIAGNOSIS — E11622 Type 2 diabetes mellitus with other skin ulcer: Secondary | ICD-10-CM | POA: Insufficient documentation

## 2022-03-05 DIAGNOSIS — E1122 Type 2 diabetes mellitus with diabetic chronic kidney disease: Secondary | ICD-10-CM | POA: Diagnosis not present

## 2022-03-05 DIAGNOSIS — N4889 Other specified disorders of penis: Secondary | ICD-10-CM | POA: Insufficient documentation

## 2022-03-05 DIAGNOSIS — L97822 Non-pressure chronic ulcer of other part of left lower leg with fat layer exposed: Secondary | ICD-10-CM | POA: Insufficient documentation

## 2022-03-05 DIAGNOSIS — Z992 Dependence on renal dialysis: Secondary | ICD-10-CM | POA: Insufficient documentation

## 2022-03-05 DIAGNOSIS — I2510 Atherosclerotic heart disease of native coronary artery without angina pectoris: Secondary | ICD-10-CM | POA: Diagnosis not present

## 2022-03-05 DIAGNOSIS — L97222 Non-pressure chronic ulcer of left calf with fat layer exposed: Secondary | ICD-10-CM | POA: Diagnosis not present

## 2022-03-05 NOTE — Progress Notes (Addendum)
LESSIE, MANIGO ([id number]) Visit Report for 03/05/2022 Arrival Information Details Patient Name: [name], [name]. Date of Service: 03/05/2022 1:15 PM Medical Record Number: [id number] Patient Account Number: [id number] Date of Birth/Sex: 07/22/1941 (81 y.o. M) Treating RN: Carlene Coria Primary Care Teren Franckowiak: Jenna Luo Other Clinician: Referring Lewis Grivas: Jenna Luo Treating Broedy Osbourne/Extender: Skipper Cliche in Treatment: 83 Visit Information History Since Last Visit All ordered tests and consults were completed: No Patient Arrived: [name] Added or deleted any medications: No Arrival Time: 13:11 Any new allergies or adverse reactions: No Accompanied By: wife Had a fall or experienced change in No Transfer Assistance: None activities of daily living that may affect Patient Identification Verified: Yes risk of falls: Secondary Verification Process Completed: Yes Signs or symptoms of abuse/neglect since last visito No Patient Requires Transmission-Based Precautions: No Hospitalized since last visit: No Patient Has Alerts: No Implantable device outside of the clinic excluding No cellular tissue based products placed in the center since last visit: Has Dressing in Place as Prescribed: Yes Pain Present Now: No Electronic Signature(s) Signed: 03/05/2022 4:03:12 PM By: Carlene Coria RN Entered By: Carlene Coria on 03/05/2022 13:16:04 Horacek, Haitham L. ([id number]) -------------------------------------------------------------------------------- Clinic Level of Care Assessment Details Patient Name: [name] Date of Service: 03/05/2022 1:15 PM Medical Record Number: [id number] Patient Account Number: [id number] Date of Birth/Sex: 20-Oct-1940 (81 y.o. M) Treating RN: Carlene Coria Primary Care Cristalle Rohm: Jenna Luo Other Clinician: Referring Lavender Stanke: Jenna Luo Treating Lysette Lindenbaum/Extender: Skipper Cliche in Treatment: 40 Clinic Level of Care Assessment  Items TOOL 4 Quantity Score []  - Use when only an EandM is performed on FOLLOW-UP visit 0 ASSESSMENTS - Nursing Assessment / Reassessment X - Reassessment of Co-morbidities (includes updates in patient status) 1 10 X- 1 5 Reassessment of Adherence to Treatment Plan ASSESSMENTS - Wound and Skin Assessment / Reassessment X - Simple Wound Assessment / Reassessment - one wound 1 5 []  - 0 Complex Wound Assessment / Reassessment - multiple wounds []  - 0 Dermatologic / Skin Assessment (not related to wound area) ASSESSMENTS - Focused Assessment []  - Circumferential Edema Measurements - multi extremities 0 []  - 0 Nutritional Assessment / Counseling / Intervention []  - 0 Lower Extremity Assessment (monofilament, tuning fork, pulses) []  - 0 Peripheral Arterial Disease Assessment (using hand held doppler) ASSESSMENTS - Ostomy and/or Continence Assessment and Care []  - Incontinence Assessment and Management 0 []  - 0 Ostomy Care Assessment and Management (repouching, etc.) PROCESS - Coordination of Care X - Simple Patient / Family Education for ongoing care 1 15 []  - 0 Complex (extensive) Patient / Family Education for ongoing care []  - 0 Staff obtains Programmer, systems, Records, Test Results / Process Orders []  - 0 Staff telephones HHA, Nursing Homes / Clarify orders / etc []  - 0 Routine Transfer to another Facility (non-emergent condition) []  - 0 Routine Hospital Admission (non-emergent condition) []  - 0 New Admissions / Biomedical engineer / Ordering NPWT, Apligraf, etc. []  - 0 Emergency Hospital Admission (emergent condition) X- 1 10 Simple Discharge Coordination []  - 0 Complex (extensive) Discharge Coordination PROCESS - Special Needs []  - Pediatric / Minor Patient Management 0 []  - 0 Isolation Patient Management []  - 0 Hearing / Language / Visual special needs []  - 0 Assessment of Community assistance (transportation, D/C planning, etc.) []  - 0 Additional assistance /  Altered mentation []  - 0 Support Surface(s) Assessment (bed, cushion, seat, etc.) INTERVENTIONS - Wound Cleansing / Measurement Obey, Quintavious L. ([id number]) X- 1 5 Simple Wound Cleansing - one  wound []  - 0 Complex Wound Cleansing - multiple wounds X- 1 5 Wound Imaging (photographs - any number of wounds) []  - 0 Wound Tracing (instead of photographs) X- 1 5 Simple Wound Measurement - one wound []  - 0 Complex Wound Measurement - multiple wounds INTERVENTIONS - Wound Dressings X - Small Wound Dressing one or multiple wounds 1 10 []  - 0 Medium Wound Dressing one or multiple wounds []  - 0 Large Wound Dressing one or multiple wounds []  - 0 Application of Medications - topical []  - 0 Application of Medications - injection INTERVENTIONS - Miscellaneous []  - External ear exam 0 []  - 0 Specimen Collection (cultures, biopsies, blood, body fluids, etc.) []  - 0 Specimen(s) / Culture(s) sent or taken to Lab for analysis []  - 0 Patient Transfer (multiple staff / Civil Service fast streamer / Similar devices) []  - 0 Simple Staple / Suture removal (25 or less) []  - 0 Complex Staple / Suture removal (26 or more) []  - 0 Hypo / Hyperglycemic Management (close monitor of Blood Glucose) []  - 0 Ankle / Brachial Index (ABI) - do not check if billed separately X- 1 5 Vital Signs Has the patient been seen at the hospital within the last three years: Yes Total Score: 75 Level Of Care: New/Established - Level 2 Electronic Signature(s) Signed: 03/05/2022 4:03:12 PM By: Carlene Coria RN Entered By: Carlene Coria on 03/05/2022 13:55:57 Stgermain, Mattthew L. ([id number]) -------------------------------------------------------------------------------- Encounter Discharge Information Details Patient Name: [name] Date of Service: 03/05/2022 1:15 PM Medical Record Number: [id number] Patient Account Number: [id number] Date of Birth/Sex: August 30, 1940 (81 y.o. M) Treating RN: Carlene Coria Primary Care Massiah Longanecker:  Jenna Luo Other Clinician: Referring Kairee Isa: Jenna Luo Treating Afnan Cadiente/Extender: Skipper Cliche in Treatment: 48 Encounter Discharge Information Items Discharge Condition: Stable Ambulatory Status: Stretcher Discharge Destination: Home Transportation: Private Auto Accompanied By: wife Schedule Follow-up Appointment: Yes Clinical Summary of Care: Electronic Signature(s) Signed: 03/05/2022 1:57:17 PM By: Carlene Coria RN Entered By: Carlene Coria on 03/05/2022 13:57:16 Brege, Tegh L. ([id number]) -------------------------------------------------------------------------------- Lower Extremity Assessment Details Patient Name: Cutright, Sulayman L. Date of Service: 03/05/2022 1:15 PM Medical Record Number: [id number] Patient Account Number: [id number] Date of Birth/Sex: 1940/09/18 (81 y.o. M) Treating RN: Carlene Coria Primary Care Amogh Komatsu: Jenna Luo Other Clinician: Referring Rosali Augello: Jenna Luo Treating Eliah Marquard/Extender: Skipper Cliche in Treatment: 40 Edema Assessment Assessed: [Left: No] [Right: No] Edema: [Left: Ye] [Right: s] Calf Left: Right: Point of Measurement: 30 cm From Medial Instep 35 cm Ankle Left: Right: Point of Measurement: 10 cm From Medial Instep 20 cm Vascular Assessment Pulses: Dorsalis Pedis Palpable: [Left:Yes] Electronic Signature(s) Signed: 03/05/2022 4:03:12 PM By: Carlene Coria RN Entered By: Carlene Coria on 03/05/2022 13:22:25 Hollett, Roddrick L. ([id number]) -------------------------------------------------------------------------------- Multi Wound Chart Details Patient Name: [name] Date of Service: 03/05/2022 1:15 PM Medical Record Number: [id number] Patient Account Number: [id number] Date of Birth/Sex: 12-01-1940 (81 y.o. M) Treating RN: Carlene Coria Primary Care Damyia Strider: Jenna Luo Other Clinician: Referring Marcquis Ridlon: Jenna Luo Treating Chardai Gangemi/Extender: Skipper Cliche in Treatment:  40 Vital Signs Height(in): 68 Pulse(bpm): 75 Weight(lbs): 172 Blood Pressure(mmHg): 113/65 Body Mass Index(BMI): 26.1 Temperature(F): 98.1 Respiratory Rate(breaths/min): 18 Photos: [N/A:N/A] Wound Location: Left, Posterior Lower Leg N/A N/A Wounding Event: Gradually Appeared N/A N/A Primary Etiology: Calciphylaxis N/A N/A Comorbid History: Coronary Artery Disease, Type II N/A N/A Diabetes Date Acquired: 04/05/2021 N/A N/A Weeks of Treatment: 40 N/A N/A Wound Status: Open N/A N/A Wound Recurrence: No N/A N/A Clustered Wound: Yes N/A N/A Clustered Quantity:  3 N/A N/A Measurements L x W x D (cm) 2.3x2x0.1 N/A N/A Area (cm) : 3.613 N/A N/A Volume (cm) : 0.361 N/A N/A % Reduction in Area: 97.90% N/A N/A % Reduction in Volume: 99.50% N/A N/A Classification: Full Thickness Without Exposed N/A N/A Support Structures Exudate Amount: Medium N/A N/A Exudate Type: Serosanguineous N/A N/A Exudate Color: red, brown N/A N/A Granulation Amount: Large (67-100%) N/A N/A Granulation Quality: Red, Hyper-granulation N/A N/A Necrotic Amount: Small (1-33%) N/A N/A Exposed Structures: Fat Layer (Subcutaneous Tissue): N/A N/A Yes Fascia: No Tendon: No Muscle: No Joint: No Bone: No Epithelialization: Large (67-100%) N/A N/A Treatment Notes Electronic Signature(s) Signed: 03/05/2022 4:03:12 PM By: Carlene Coria RN Entered By: Carlene Coria on 03/05/2022 13:22:46 Montero, Rogelio L. ([id number]) Greenleaf, Jayshaun Carlean Jews ([id number]) -------------------------------------------------------------------------------- [place] Details Patient Name: [name] Date of Service: 03/05/2022 1:15 PM Medical Record Number: [id number] Patient Account Number: [id number] Date of Birth/Sex: 01/27/1941 (81 y.o. M) Treating RN: Carlene Coria Primary Care Vonda Harth: Jenna Luo Other Clinician: Referring Kaiyu Mirabal: Jenna Luo Treating Lanard Arguijo/Extender: Skipper Cliche in Treatment:  40 Active Inactive Wound/Skin Impairment Nursing Diagnoses: Knowledge deficit related to ulceration/compromised skin integrity Goals: Patient/caregiver will verbalize understanding of skin care regimen Date Initiated: 05/24/2021 Target Resolution Date: 01/08/2022 Goal Status: Active Ulcer/skin breakdown will have a volume reduction of 30% by week 4 Date Initiated: 05/24/2021 Date Inactivated: 08/14/2021 Target Resolution Date: 07/24/2021 Goal Status: Unmet Unmet Reason: comorbites Ulcer/skin breakdown will have a volume reduction of 50% by week 8 Date Initiated: 05/24/2021 Date Inactivated: 09/25/2021 Target Resolution Date: 08/24/2021 Goal Status: Unmet Unmet Reason: comorbities Ulcer/skin breakdown will have a volume reduction of 80% by week 12 Date Initiated: 05/24/2021 Date Inactivated: 09/25/2021 Target Resolution Date: 09/24/2021 Goal Status: Unmet Unmet Reason: comorbities Ulcer/skin breakdown will heal within 14 weeks Date Initiated: 05/24/2021 Date Inactivated: 10/30/2021 Target Resolution Date: 10/22/2021 Goal Status: Unmet Unmet Reason: comorbities Interventions: Assess patient/caregiver ability to obtain necessary supplies Assess patient/caregiver ability to perform ulcer/skin care regimen upon admission and as needed Assess ulceration(s) every visit Notes: Electronic Signature(s) Signed: 03/05/2022 4:03:12 PM By: Carlene Coria RN Entered By: Carlene Coria on 03/05/2022 13:22:31 Minix, Quintarius L. ([id number]) -------------------------------------------------------------------------------- Pain Assessment Details Patient Name: [name] Date of Service: 03/05/2022 1:15 PM Medical Record Number: [id number] Patient Account Number: [id number] Date of Birth/Sex: 01-Nov-1940 (81 y.o. M) Treating RN: Carlene Coria Primary Care Sharlie Shreffler: Jenna Luo Other Clinician: Referring Nelsy Madonna: Jenna Luo Treating Clayburn Weekly/Extender: Skipper Cliche in Treatment:  40 Active Problems Location of Pain Severity and Description of Pain Patient Has Paino No Site Locations Pain Management and Medication Current Pain Management: Electronic Signature(s) Signed: 03/05/2022 4:03:12 PM By: Carlene Coria RN Entered By: Carlene Coria on 03/05/2022 13:16:37 Wain, Brannen L. ([id number]) -------------------------------------------------------------------------------- Patient/Caregiver Education Details Patient Name: [name] Date of Service: 03/05/2022 1:15 PM Medical Record Number: [id number] Patient Account Number: [id number] Date of Birth/Gender: 1941/05/06 (81 y.o. M) Treating RN: Carlene Coria Primary Care Physician: Jenna Luo Other Clinician: Referring Physician: Jenna Luo Treating Physician/Extender: Skipper Cliche in Treatment: 22 Education Assessment Education Provided To: Patient Education Topics Provided Wound/Skin Impairment: Methods: Explain/Verbal Responses: State content correctly Electronic Signature(s) Signed: 03/05/2022 4:03:12 PM By: Carlene Coria RN Entered By: Carlene Coria on 03/05/2022 13:56:33 Anspach, Rayshad L. ([id number]) -------------------------------------------------------------------------------- Wound Assessment Details Patient Name: Seltzer, Davi L. Date of Service: 03/05/2022 1:15 PM Medical Record Number: [id number] Patient Account Number: [id number] Date of Birth/Sex: 1941/07/14 (81 y.o. M) Treating RN: Carlene Coria Primary Care Taylynn Easton:  Jenna Luo Other Clinician: Referring Odean Mcelwain: Jenna Luo Treating Sole Lengacher/Extender: Skipper Cliche in Treatment: 40 Wound Status Wound Number: 1 Primary Etiology: Calciphylaxis Wound Location: Left, Posterior Lower Leg Wound Status: Open Wounding Event: Gradually Appeared Comorbid History: Coronary Artery Disease, Type II Diabetes Date Acquired: 04/05/2021 Weeks Of Treatment: 40 Clustered Wound: Yes Photos Wound Measurements Length: (cm)  2.3 Width: (cm) 2 Depth: (cm) 0.1 Clustered Quantity: 3 Area: (cm) 3.613 Volume: (cm) 0.361 % Reduction in Area: 97.9% % Reduction in Volume: 99.5% Epithelialization: Large (67-100%) Tunneling: No Undermining: No Wound Description Classification: Full Thickness Without Exposed Support Structu Exudate Amount: Medium Exudate Type: Serosanguineous Exudate Color: red, brown res Foul Odor After Cleansing: No Slough/Fibrino Yes Wound Bed Granulation Amount: Large (67-100%) Exposed Structure Granulation Quality: Red, Hyper-granulation Fascia Exposed: No Necrotic Amount: Small (1-33%) Fat Layer (Subcutaneous Tissue) Exposed: Yes Necrotic Quality: Adherent Slough Tendon Exposed: No Muscle Exposed: No Joint Exposed: No Bone Exposed: No Treatment Notes Wound #1 (Lower Leg) Wound Laterality: Left, Posterior Cleanser Dakin 16 (oz) 0.25 Discharge Instruction: Use as directed. Peri-Wound Care RAI, SEVERNS ([id number]) Topical Primary Dressing Gauze Discharge Instruction: As directed: moistened with Dakins Solution Secondary Dressing Kerlix 4.5 x 4.1 (in/yd) Discharge Instruction: Apply Kerlix 4.5 x 4.1 (in/yd) as instructed Secured With [place] H Soft Cloth Surgical Tape, 2x2 (in/yd) Compression Wrap Compression Stockings Add-Ons Electronic Signature(s) Signed: 03/05/2022 4:03:12 PM By: Carlene Coria RN Entered By: Carlene Coria on 03/05/2022 13:20:07 Dodge, Magnum L. ([id number]) -------------------------------------------------------------------------------- [place] Details Patient Name: [name] Date of Service: 03/05/2022 1:15 PM Medical Record Number: [id number] Patient Account Number: [id number] Date of Birth/Sex: 12/10/1940 (81 y.o. M) Treating RN: Carlene Coria Primary Care Narjis Mira: Jenna Luo Other Clinician: Referring Lukah Goswami: Jenna Luo Treating Pegi Milazzo/Extender: Skipper Cliche in Treatment: 40 Vital Signs Time  Taken: 13:16 Temperature (F): 98.1 Height (in): 68 Pulse (bpm): 56 Weight (lbs): 172 Respiratory Rate (breaths/min): 18 Body Mass Index (BMI): 26.1 Blood Pressure (mmHg): 113/65 Reference Range: 80 - 120 mg / dl Electronic Signature(s) Signed: 03/05/2022 4:03:12 PM By: Carlene Coria RN Entered By: Carlene Coria on 03/05/2022 13:16:28

## 2022-03-05 NOTE — Progress Notes (Addendum)
[name], [name] ([id number]) Visit Report for 03/05/2022 Chief Complaint Document Details Patient Name: [name], [name]. Date of Service: 03/05/2022 1:15 PM Medical Record Number: [id number] Patient Account Number: [id number] Date of Birth/Sex: [birth year] (81 y.o. 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 ([id number]) -------------------------------------------------------------------------------- HPI Details Patient Name: Mark Morale L. Date of Service: 03/05/2022 1:15 PM Medical Record Number: [id number] Patient Account Number: [id number] Date of Birth/Sex: 1940-11-16 (81 y.o. 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 This has been present for a couple of months currently he is on the sodium thiosulfate at dialysis. He tells me that they picked up on this quickly and have been managing it but he really has not had any help with anything else from the standpoint of progressing the wound itself. The patient does have again end-stage renal disease which currently is listed as stage IV but he is on renal dialysis. He also has coronary artery disease as well as going shortly for formal arterial studies although he did have a quick test  of the tibial artery with his vascular doctor when he went in to check on his fistula for his arm and subsequently they did not feel like he had any hemodynamically unstable flow into the lower extremity this is good news. Nonetheless I think that the biggest issue here is going to be getting some of the eschar off so that we get the wounds med headed in the appropriate direction. 05/31/2021 upon evaluation today patient appears to be doing well with regard to his leg ulceration with calciphylaxis. I am very pleased in this regard. Fortunately there does not appear to be any signs of active infection at this time which is great news. No fevers, chills, nausea, vomiting, or diarrhea. 06/07/2021 upon evaluation today patient appears to be doing well with regard to his legs. Unfortunately he is having an issue here with his penis as well he did mention at the end of last visit. I suggested that he go see urology. However when he went to see the urologist they basically told him there was not anything they could do. They recommended that we needed to be the ones to take care of this. With that being said this is an unusual area that to be honest a lot of the traditional wound care products we use are not really good to be good for this region. Potentially Medihoney alginate could be a possibility also think Santyl could be a possibility. With that being said I think initially my suggestion is probably can be for [place] to attempt the Santyl to see if that will benefit the patient. 06/21/2021 upon evaluation today patient's wound bed actually showed signs of good granulation and epithelization at this point. Fortunately I do not see any signs of active infection systemically which is great news and his legs are doing awesome I think you are making great progress. The Santyl on  the glans penis also has been of benefit this is looking tremendously better. Again the patient is pleased he also tells me the pain  is significantly improved. Overall I think we are headed in the appropriate direction here. 07/05/2021 upon evaluation today patient appears to be doing well with regard to his wounds. Both the area on the tip of his penis as well as the left lateral leg is doing well. There is some debridement this can be needed over the leg region. Fortunately I think that overall the Dakin's moistened gauze has done a great job here. No fevers, chills, nausea, vomiting, or diarrhea. 07/12/2021 upon evaluation today patient presents for reevaluation here in the clinic and overall I think that his wounds are doing significantly better which is great news. This includes both the left leg as well as the glans of the penis. Both are showing signs of significant improvement which is great and overall I think that we are headed in the right direction. I do not see any evidence of active infection at this moment. 07/19/2021 upon evaluation today patient appears to be doing better in regard to his wounds. Were getting much closer to complete resolution in regard to the necrotic tissue being removed from the leg. Overall I am extremely pleased with where we stand today. Overall I do not see any signs of active infection at this time. With regard to the patient's penis this area is showing signs of improvement as well and I am very pleased with that. 07/26/2021 upon evaluation today patient appears to be doing well with regard to his wounds. The leg in fact is looking quite well and I am very pleased with where we stand today. I do not see any signs of infection which is great and there is a lot of new granulation growth also also. With regard to the penis area this also showed signs of excellent improvement which is great news. 08/14/2020 upon evaluation today patient appears to be doing well with regard to his wounds both the leg as well as the glans penis region. Both are showing signs of improvement which is great news and  overall I am extremely pleased with where we stand at this point. I think that the Dakin's moistened gauze dressing for her leg is doing great and the Santyl is definitely helping with the glans penis location. 08/28/2021 upon evaluation today patient appears to be doing well with regard to his wounds on the left leg as well as the penis. Fortunately both are showing signs of improvement. I am going to perform some debridement in regard to the leg there is a small area which is having trouble getting completely clean my work on that a little bit more today. 09/04/2021 upon evaluation today patient appears to be doing pretty well in regard to his wounds. 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 Again he has I just found out today been off of the sodium thiosulfate for about 2 to 3 weeks. He tells me that the initial "3 weeks" was stated to be up by the nurse. With that being said I think he probably is going to need it for a bit longer to be honest. We discussed that today. Over the penis area the Santyl does seem to be loose and a lot of this up and that is good hopefully will be able  to get this completely cleared off it sometime shortly. 09/13/2021 upon evaluation today patient appears to be doing well with regard to his wound. He has been tolerating the dressing changes without complication. Fortunately there does not appear to be any signs of active infection locally or systemically which is great news. Overall I think that he is making progress with regard to his wounds in general. 09/25/2021 upon evaluation today patient appears to be doing excellent in regard to his wounds. I am actually extremely pleased with where we stand today and I think he is making great progress. The patient likewise is happy to hear this. 10/09/2021 upon evaluation today patient actually appears to be doing excellent in  regard to his wound. He has been tolerating the dressing changes without complication. Fortunately I do not see any evidence of active infection locally or systemically which is great news. No fevers, Sharp, Doniven L. ([id number]) chills, nausea, vomiting, or diarrhea. 3/14; patient presents for follow-up. He has no issues or complaints today. 10/23/2021 upon evaluation today patient appears to be doing well with regard to his wounds. In fact the penis appears to be pretty much healed there may be a point when opening but not much at all to be perfectly honest. In regard to the leg I Flygt were definitely seeing signs of significant improvement which is great news. 10/30/2021 upon evaluation today patient appears to be doing well with regard to his wounds. In fact the penis area is completely healed which is great news that just the leg that were dealing with and this is significantly smaller. I am actually extremely pleased with where we stand. 11-06-2021 upon evaluation today patient appears to be doing well with regard to his leg ulcers. All are measuring smaller and looking great. I do not see any signs of infection which is great news. No fever chills noted 11-13-2021 upon evaluation today patient appears to be doing well with regard to his wounds everything is still measuring smaller and looking excellent. I am extremely pleased with where we stand today. There does not appear to be any signs of active infection at this time. No fevers, chills, nausea, vomiting, or diarrhea. 11-20-2021 upon evaluation today patient actually appears to be doing quite well in regard to his wounds. Has been tolerating the dressing changes without complication. Fortunately I do not see any evidence of active infection locally or systemically at this time which is great news. No fevers, chills, nausea, vomiting, or diarrhea. I think were definitely headed in the right direction here. 12-04-2021 upon evaluation today  patient appears to be doing well with regard to his wounds. Overall things seem to be measuring smaller and doing much better. Fortunately there does not appear to be any evidence of active infection locally nor systemically which is great news. No fevers, chills, nausea, vomiting, or diarrhea. 12/11/2021 upon evaluation today patient actually appears to be doing great in regard to his wounds he has been tolerating the dressing changes without complication. Fortunately there does not appear to be any evidence of active infection locally or systemically which is great news. No fevers, chills, nausea, vomiting, or diarrhea. 12-25-2021 upon evaluation today patient appears to be doing excellent in regard to his leg ulcers he has been tolerating the dressing changes and fortunately I do not see any signs of active infection locally or systemically at this time which is great news. Overall I do believe that we are headed in the appropriate direction here. I do not  see any evidence that this is worsening in general which is great news. 01-08-2022 upon evaluation today patient's leg is almost completely closed on the lateral aspect of the posterior is doing awesome. I do not see any evidence of infection and overall I think he is really making excellent progress here. Overall I think we are on the right track. 01-24-2022 upon evaluation today patient's wound is actually showing signs of excellent improvement he did have 1 small area that reopened it looks as if he may have bumped this on something I am not 100% sure. With that being said he overall seems to be doing quite well and I am very pleased with where things stand currently I see no signs of active infection at this time which is great news. 02-07-2022 upon evaluation today patient is making excellent progress his wounds are measuring smaller looking much better and overall I am extremely pleased with where things stand currently. I do not see any evidence  of active infection locally or systemically which is great news. Upon inspection patient's wound bed actually showed signs of good granulation and epithelization at this point. Fortunately I think that he is doing awesome with regard to his healing and I am very pleased in that regard. I think that we are on the right track. 02-19-2022 upon evaluation today patient appears to be doing well currently in regard to her wound. She has been tolerating the dressing changes without complication. Fortunately there does not appear to be any evidence of active infection locally or systemically which is great news. No fevers, chills, nausea, vomiting, or diarrhea. 03-05-2022 patient appears to be doing very well in regard to his leg ulcer. The area that is remaining is currently extremely small and seems to be making great progress. I do not see any evidence of active infection locally or systemically at this time which is great news. No fevers, chills, nausea, vomiting, or diarrhea. Electronic Signature(s) Signed: 03/07/2022 6:17:13 PM By: Worthy Keeler PA-C Entered By: Worthy Keeler on 03/07/2022 18:17:13 Glomb, [name] ([id number]) -------------------------------------------------------------------------------- Physical Exam Details Patient Name: Wilkerson, Cashius L. Date of Service: 03/05/2022 1:15 PM Medical Record Number: [id number] Patient Account Number: [id number] Date of Birth/Sex: [birth year] (81 y.o. M) Treating RN: Carlene Coria Primary Care Provider: Jenna Luo Other Clinician: Referring Provider: Jenna Luo Treating Provider/Extender: Skipper Cliche in Treatment: 78 Constitutional Well-nourished and well-hydrated in no acute distress. Respiratory normal breathing without difficulty. Psychiatric this patient is able to make decisions and demonstrates good insight into disease process. Alert and Oriented x 3. pleasant and cooperative. Notes Upon inspection patient's wound bed  actually showed signs of good granulation and epithelization at this point. Overall I do believe that we are on the right track here and I am very hopeful that the patient will continue to show signs of excellent improvement as we progress over the next couple of weeks in fact I am hoping it will be closed within the next 2 to 4 weeks maximum. Electronic Signature(s) Signed: 03/07/2022 6:17:46 PM By: Worthy Keeler PA-C Entered By: Worthy Keeler on 03/07/2022 18:17:45 Cashman, [name] ([id number]) -------------------------------------------------------------------------------- Physician Orders Details Patient Name: Mark Morale L. Date of Service: 03/05/2022 1:15 PM Medical Record Number: [id number] Patient Account Number: [id number] Date of Birth/Sex: 11/25/1940 (81 y.o. M) Treating RN: Carlene Coria Primary Care Provider: Jenna Luo Other Clinician: Referring Provider: Jenna Luo Treating Provider/Extender: Skipper Cliche in Treatment: 45 Verbal / Phone Orders: No Diagnosis Coding ICD-10  Coding Code Description E83.50 Unspecified disorder of calcium metabolism L97.822 Non-pressure chronic ulcer of other part of left lower leg with fat layer exposed L98.492 Non-pressure chronic ulcer of skin of other sites with fat layer exposed Z99.2 Dependence on renal dialysis N18.4 Chronic kidney disease, stage 4 (severe) I25.10 Atherosclerotic heart disease of native coronary artery without angina pectoris Follow-up Appointments o Return Appointment in 2 weeks. Edema Control - Lymphedema / Segmental Compressive Device / Other o Elevate, Exercise Daily and Avoid Standing for Long Periods of Time. o Elevate legs to the level of the heart and pump ankles as often as possible o Elevate leg(s) parallel to the floor when sitting. Medications-Please add to medication list. o Other: - AandD ointment to leg Wound Treatment Wound #1 - Lower Leg Wound Laterality: Left,  Posterior Cleanser: Dakin 16 (oz) 0.25 1 x Per Day/30 Days Discharge Instructions: Use as directed. Primary Dressing: Gauze 1 x Per Day/30 Days Discharge Instructions: As directed: moistened with Dakins Solution Secondary Dressing: Kerlix 4.5 x 4.1 (in/yd) (Generic) 1 x Per Day/30 Days Discharge Instructions: Apply Kerlix 4.5 x 4.1 (in/yd) as instructed Secured With: Medipore Tape - 49M Medipore H Soft Cloth Surgical Tape, 2x2 (in/yd) (Generic) 1 x Per Day/30 Days Electronic Signature(s) Signed: 03/05/2022 1:55:25 PM By: Carlene Coria RN Signed: 03/07/2022 6:41:29 PM By: Worthy Keeler PA-C Entered By: Carlene Coria on 03/05/2022 13:55:25 Fitzwater, Jaquin L. ([id number]) -------------------------------------------------------------------------------- Problem List Details Patient Name: Azucena, Jehu L. Date of Service: 03/05/2022 1:15 PM Medical Record Number: [id number] Patient Account Number: [id number] Date of Birth/Sex: Oct 20, 1940 (81 y.o. M) Treating RN: Carlene Coria Primary Care Provider: Jenna Luo Other Clinician: Referring Provider: Jenna Luo Treating Provider/Extender: Skipper Cliche in Treatment: 18 Active Problems ICD-10 Encounter Code Description Active Date MDM Diagnosis E83.50 Unspecified disorder of calcium metabolism 05/24/2021 No Yes L97.822 Non-pressure chronic ulcer of other part of left lower leg with fat layer 05/24/2021 No Yes exposed L98.492 Non-pressure chronic ulcer of skin of other sites with fat layer exposed 06/07/2021 No Yes Z99.2 Dependence on renal dialysis 05/24/2021 No Yes N18.4 Chronic kidney disease, stage 4 (severe) 05/24/2021 No Yes I25.10 Atherosclerotic heart disease of native coronary artery without angina 05/24/2021 No Yes pectoris Inactive Problems Resolved Problems Electronic Signature(s) Signed: 03/05/2022 1:10:58 PM By: Worthy Keeler PA-C Entered By: Worthy Keeler on 03/05/2022 13:10:58 Affinito, Deavin L.  ([id number]) -------------------------------------------------------------------------------- Progress Note Details Patient Name: Clouse, Reilly L. Date of Service: 03/05/2022 1:15 PM Medical Record Number: [id number] Patient Account Number: [id number] Date of Birth/Sex: Oct 20, 1940 (81 y.o. 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 This has been present for a couple of months currently he is on the sodium thiosulfate at dialysis. He tells me that they picked up on this quickly and have been managing it but he really has not had any help with anything else from the standpoint of progressing the wound itself. The patient does have again end-stage renal disease which currently is listed as stage IV but he is on renal dialysis. He also has coronary artery disease as well as going shortly for formal arterial studies although he did  have a quick test of the tibial artery with his vascular doctor when he went in to check on his fistula for his arm and subsequently they did not feel like he had any hemodynamically unstable flow into the lower extremity this is good news. Nonetheless I think that the biggest issue here is going to be getting some of the eschar off so that we get the wounds med headed in the appropriate direction. 05/31/2021 upon evaluation today patient appears to be doing well with regard to his leg ulceration with calciphylaxis. I am very pleased in this regard. Fortunately there does not appear to be any signs of  active infection at this time which is great news. No fevers, chills, nausea, vomiting, or diarrhea. 06/07/2021 upon evaluation today patient appears to be doing well with regard to his legs. Unfortunately he is having an issue here with his penis as well he did mention at the end of last visit. I suggested that he go see urology. However when he went to see the urologist they basically told him there was not anything they could do. They recommended that we needed to be the ones to take care of this. With that being said this is an unusual area that to be honest a lot of the traditional wound care products we use are not really good to be good for this region. Potentially Medihoney alginate could be a possibility also think Santyl could be a possibility. With that being said I think initially my suggestion is probably can be for [place] to attempt the Santyl to see if that will benefit the patient. 06/21/2021 upon evaluation today patient's wound bed actually showed signs of good granulation and epithelization at this point. Fortunately I do not see any signs of active infection systemically which is great news and his legs are doing awesome I think you are making great progress. The Santyl on the glans penis also has been of benefit this is looking tremendously better. Again the patient is pleased he also tells me the pain is significantly improved. Overall I think we are headed in the appropriate direction here. 07/05/2021 upon evaluation today patient appears to be doing well with regard to his wounds. Both the area on the tip of his penis as well as the left lateral leg is doing well. There is some debridement this can be needed over the leg region. Fortunately I think that overall the Dakin's moistened gauze has done a great job here. No fevers, chills, nausea, vomiting, or diarrhea. 07/12/2021 upon evaluation today patient presents for reevaluation here in the clinic and overall I think that his wounds  are doing significantly better which is great news. This includes both the left leg as well as the glans of the penis. Both are showing signs of significant improvement which is great and overall I think that we are headed in the right direction. I do not see any evidence of active infection at this moment. 07/19/2021 upon evaluation today patient appears to be doing better in regard to his wounds. Were getting much closer to complete resolution in regard to the necrotic tissue being removed from the leg. Overall I am extremely pleased with where we stand today. Overall I do not see any signs of active infection at this time. With regard to the patient's penis this area is showing signs of improvement as well and I am very pleased with that. 07/26/2021 upon evaluation today patient appears to be doing well with regard to his  wounds. The leg in fact is looking quite well and I am very pleased with where we stand today. I do not see any signs of infection which is great and there is a lot of new granulation growth also also. With regard to the penis area this also showed signs of excellent improvement which is great news. 08/14/2020 upon evaluation today patient appears to be doing well with regard to his wounds both the leg as well as the glans penis region. Both are showing signs of improvement which is great news and overall I am extremely pleased with where we stand at this point. I think that the Dakin's moistened gauze dressing for her leg is doing great and the Santyl is definitely helping with the glans penis location. 08/28/2021 upon evaluation today patient appears to be doing well with regard to his wounds on the left leg as well as the penis. Fortunately both are showing signs of improvement. I am going to perform some debridement in regard to the leg there is a small area which is having trouble getting completely clean my work on that a little bit more today. 09/04/2021 upon evaluation today  patient appears to be doing pretty well in regard to his wounds. 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 Again he has I just found out today been off of the sodium thiosulfate for about 2 to 3 weeks. He tells me that the initial "3 weeks" was stated to be up by the nurse. With that being said I think he probably is going to need it for a bit longer to be honest. We discussed that today. Over the penis area the Santyl does seem to be loose and a lot of this up and that is good hopefully will be able to get this completely cleared off it sometime shortly. 09/13/2021 upon evaluation today patient appears to be doing well with regard to his wound. He has been tolerating the dressing changes without complication. Fortunately there does not appear to be any signs of active infection locally or systemically which is great news. Overall I think that he is making progress with regard to his wounds in general. Bents, Geovany L. ([id number]) 09/25/2021 upon evaluation today patient appears to be doing excellent in regard to his wounds. I am actually extremely pleased with where we stand today and I think he is making great progress. The patient likewise is happy to hear this. 10/09/2021 upon evaluation today patient actually appears to be doing excellent in regard to his wound. He has been tolerating the dressing changes without complication. Fortunately I do not see any evidence of active infection locally or systemically which is great news. No fevers, chills, nausea, vomiting, or diarrhea. 3/14; patient presents for follow-up. He has no issues or complaints today. 10/23/2021 upon evaluation today patient appears to be doing well with regard to his wounds. In fact the penis appears to be pretty much healed there may be a point when opening but not much at all to be perfectly honest. In regard to the leg I Flygt  were definitely seeing signs of significant improvement which is great news. 10/30/2021 upon evaluation today patient appears to be doing well with regard to his wounds. In fact the penis area is completely healed which is great news that just the leg that were dealing with and this is significantly smaller. I  am actually extremely pleased with where we stand. 11-06-2021 upon evaluation today patient appears to be doing well with regard to his leg ulcers. All are measuring smaller and looking great. I do not see any signs of infection which is great news. No fever chills noted 11-13-2021 upon evaluation today patient appears to be doing well with regard to his wounds everything is still measuring smaller and looking excellent. I am extremely pleased with where we stand today. There does not appear to be any signs of active infection at this time. No fevers, chills, nausea, vomiting, or diarrhea. 11-20-2021 upon evaluation today patient actually appears to be doing quite well in regard to his wounds. Has been tolerating the dressing changes without complication. Fortunately I do not see any evidence of active infection locally or systemically at this time which is great news. No fevers, chills, nausea, vomiting, or diarrhea. I think were definitely headed in the right direction here. 12-04-2021 upon evaluation today patient appears to be doing well with regard to his wounds. Overall things seem to be measuring smaller and doing much better. Fortunately there does not appear to be any evidence of active infection locally nor systemically which is great news. No fevers, chills, nausea, vomiting, or diarrhea. 12/11/2021 upon evaluation today patient actually appears to be doing great in regard to his wounds he has been tolerating the dressing changes without complication. Fortunately there does not appear to be any evidence of active infection locally or systemically which is great news. No fevers, chills,  nausea, vomiting, or diarrhea. 12-25-2021 upon evaluation today patient appears to be doing excellent in regard to his leg ulcers he has been tolerating the dressing changes and fortunately I do not see any signs of active infection locally or systemically at this time which is great news. Overall I do believe that we are headed in the appropriate direction here. I do not see any evidence that this is worsening in general which is great news. 01-08-2022 upon evaluation today patient's leg is almost completely closed on the lateral aspect of the posterior is doing awesome. I do not see any evidence of infection and overall I think he is really making excellent progress here. Overall I think we are on the right track. 01-24-2022 upon evaluation today patient's wound is actually showing signs of excellent improvement he did have 1 small area that reopened it looks as if he may have bumped this on something I am not 100% sure. With that being said he overall seems to be doing quite well and I am very pleased with where things stand currently I see no signs of active infection at this time which is great news. 02-07-2022 upon evaluation today patient is making excellent progress his wounds are measuring smaller looking much better and overall I am extremely pleased with where things stand currently. I do not see any evidence of active infection locally or systemically which is great news. Upon inspection patient's wound bed actually showed signs of good granulation and epithelization at this point. Fortunately I think that he is doing awesome with regard to his healing and I am very pleased in that regard. I think that we are on the right track. 02-19-2022 upon evaluation today patient appears to be doing well currently in regard to her wound. She has been tolerating the dressing changes without complication. Fortunately there does not appear to be any evidence of active infection locally or systemically which is  great news. No fevers, chills, nausea, vomiting,  or diarrhea. 03-05-2022 patient appears to be doing very well in regard to his leg ulcer. The area that is remaining is currently extremely small and seems to be making great progress. I do not see any evidence of active infection locally or systemically at this time which is great news. No fevers, chills, nausea, vomiting, or diarrhea. Objective Constitutional Well-nourished and well-hydrated in no acute distress. Vitals Time Taken: 1:16 PM, Height: 68 in, Weight: 172 lbs, BMI: 26.1, Temperature: 98.1 F, Pulse: 56 bpm, Respiratory Rate: 18 breaths/min, Blood Pressure: 113/65 mmHg. Respiratory Pirro, Bardolph ([id number]) normal breathing without difficulty. Psychiatric this patient is able to make decisions and demonstrates good insight into disease process. Alert and Oriented x 3. pleasant and cooperative. General Notes: Upon inspection patient's wound bed actually showed signs of good granulation and epithelization at this point. Overall I do believe that we are on the right track here and I am very hopeful that the patient will continue to show signs of excellent improvement as we progress over the next couple of weeks in fact I am hoping it will be closed within the next 2 to 4 weeks maximum. Integumentary (Hair, Skin) Wound #1 status is Open. Original cause of wound was Gradually Appeared. The date acquired was: 04/05/2021. The wound has been in treatment 40 weeks. The wound is located on the Left,Posterior Lower Leg. The wound measures 2.3cm length x 2cm width x 0.1cm depth; 3.613cm^2 area and 0.361cm^3 volume. There is Fat Layer (Subcutaneous Tissue) exposed. There is no tunneling or undermining noted. There is a medium amount of serosanguineous drainage noted. There is large (67-100%) red, hyper - granulation within the wound bed. There is a small (1-33%) amount of necrotic tissue within the wound bed including Adherent  Slough. 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 Edema Control - Lymphedema / Segmental Compressive Device / Other: Elevate, Exercise Daily and Avoid Standing for Long Periods of Time. Elevate legs to the level of the heart and pump ankles as often as possible Elevate leg(s) parallel to the floor when sitting. Medications-Please add to medication list.: Other: - AandD ointment to leg WOUND #1: - Lower Leg Wound Laterality: Left, Posterior Cleanser: Dakin 16 (oz) 0.25 1 x Per Day/30 Days Discharge Instructions: Use as directed. Primary Dressing: Gauze 1 x Per Day/30 Days Discharge Instructions: As directed: moistened with Dakins Solution Secondary Dressing: Kerlix 4.5 x 4.1 (in/yd) (Generic) 1 x Per Day/30 Days Discharge Instructions: Apply Kerlix 4.5 x 4.1 (in/yd) as instructed Secured With: Medipore Tape - 16M Medipore H Soft Cloth Surgical Tape, 2x2 (in/yd) (Generic) 1 x Per Day/30 Days 1. Would recommend that we going continue with the wound care measures as before and the patient is in agreement with plan this includes the Dakin's moistened gauze dressing which I think is doing a great job. 2. I am also can recommend we have the patient continue with the roll gauze to secure in place which is doing an awesome job. We will see patient back for reevaluation in 2 weeks here in the clinic. If anything worsens or changes patient will contact our office for additional recommendations. Electronic Signature(s) Signed: 03/07/2022 6:18:10 PM By:  Melburn Hake, Aairah Negrette PA-C Entered By: Worthy Keeler on  03/07/2022 18:18:10 Wicke, [name] ([id number]) -------------------------------------------------------------------------------- SuperBill Details Patient Name: Mark Morale L. Date of Service: 03/05/2022 Medical Record Number: [id number] Patient Account Number: [id number] Date of Birth/Sex: Dec 08, 1940 (81 y.o. M) Treating RN: Carlene Coria Primary Care Provider: Jenna Luo Other Clinician: Referring Provider: Jenna Luo Treating Provider/Extender: Skipper Cliche in Treatment: 40 Diagnosis Coding ICD-10 Codes Code Description E83.50 Unspecified disorder of calcium metabolism L97.822 Non-pressure chronic ulcer of other part of left lower leg with fat layer exposed L98.492 Non-pressure chronic ulcer of skin of other sites with fat layer exposed Z99.2 Dependence on renal dialysis N18.4 Chronic kidney disease, stage 4 (severe) I25.10 Atherosclerotic heart disease of native coronary artery without angina pectoris Facility Procedures CPT4 Code: [id number] Description: [phone] - WOUND CARE VISIT-LEV 2 EST PT Modifier: Quantity: 1 Physician Procedures CPT4 Code: [id number] Description: 99213 - WC PHYS LEVEL 3 - EST PT Modifier: Quantity: 1 CPT4 Code: Description: ICD-10 Diagnosis Description E83.50 Unspecified disorder of calcium metabolism L97.822 Non-pressure chronic ulcer of other part of left lower leg with fat lay L98.492 Non-pressure chronic ulcer of skin of other sites with fat layer expose  Z99.2 Dependence on renal dialysis Modifier: er exposed d Quantity: Electronic Signature(s) Signed: 03/07/2022 6:18:22 PM By: Worthy Keeler PA-C Previous Signature: 03/05/2022 1:56:22 PM Version By: Carlene Coria RN Entered By: Worthy Keeler on 03/07/2022 18:18:22

## 2022-03-06 DIAGNOSIS — D689 Coagulation defect, unspecified: Secondary | ICD-10-CM | POA: Diagnosis not present

## 2022-03-06 DIAGNOSIS — N186 End stage renal disease: Secondary | ICD-10-CM | POA: Diagnosis not present

## 2022-03-06 DIAGNOSIS — Z992 Dependence on renal dialysis: Secondary | ICD-10-CM | POA: Diagnosis not present

## 2022-03-06 DIAGNOSIS — E876 Hypokalemia: Secondary | ICD-10-CM | POA: Diagnosis not present

## 2022-03-06 DIAGNOSIS — N2581 Secondary hyperparathyroidism of renal origin: Secondary | ICD-10-CM | POA: Diagnosis not present

## 2022-03-11 DIAGNOSIS — Z992 Dependence on renal dialysis: Secondary | ICD-10-CM | POA: Diagnosis not present

## 2022-03-11 DIAGNOSIS — D631 Anemia in chronic kidney disease: Secondary | ICD-10-CM | POA: Diagnosis not present

## 2022-03-11 DIAGNOSIS — E876 Hypokalemia: Secondary | ICD-10-CM | POA: Diagnosis not present

## 2022-03-11 DIAGNOSIS — N186 End stage renal disease: Secondary | ICD-10-CM | POA: Diagnosis not present

## 2022-03-11 DIAGNOSIS — D689 Coagulation defect, unspecified: Secondary | ICD-10-CM | POA: Diagnosis not present

## 2022-03-11 DIAGNOSIS — N2581 Secondary hyperparathyroidism of renal origin: Secondary | ICD-10-CM | POA: Diagnosis not present

## 2022-03-12 DIAGNOSIS — E113313 Type 2 diabetes mellitus with moderate nonproliferative diabetic retinopathy with macular edema, bilateral: Secondary | ICD-10-CM | POA: Diagnosis not present

## 2022-03-18 DIAGNOSIS — Z23 Encounter for immunization: Secondary | ICD-10-CM | POA: Diagnosis not present

## 2022-03-18 DIAGNOSIS — N186 End stage renal disease: Secondary | ICD-10-CM | POA: Diagnosis not present

## 2022-03-18 DIAGNOSIS — E1122 Type 2 diabetes mellitus with diabetic chronic kidney disease: Secondary | ICD-10-CM | POA: Diagnosis not present

## 2022-03-18 DIAGNOSIS — D689 Coagulation defect, unspecified: Secondary | ICD-10-CM | POA: Diagnosis not present

## 2022-03-18 DIAGNOSIS — Z992 Dependence on renal dialysis: Secondary | ICD-10-CM | POA: Diagnosis not present

## 2022-03-18 DIAGNOSIS — N2581 Secondary hyperparathyroidism of renal origin: Secondary | ICD-10-CM | POA: Diagnosis not present

## 2022-03-19 ENCOUNTER — Encounter: Payer: PPO | Admitting: Physician Assistant

## 2022-03-19 DIAGNOSIS — E11622 Type 2 diabetes mellitus with other skin ulcer: Secondary | ICD-10-CM | POA: Diagnosis not present

## 2022-03-19 DIAGNOSIS — L97222 Non-pressure chronic ulcer of left calf with fat layer exposed: Secondary | ICD-10-CM | POA: Diagnosis not present

## 2022-03-21 NOTE — Progress Notes (Addendum)
[name] ([id number]) Visit Report for 03/19/2022 Arrival Information Details Patient Name: [name], [name]. Date of Service: 03/19/2022 1:15 PM Medical Record Number: [id number] Patient Account Number: [id number] Date of Birth/Sex: Oct 16, 1940 (81 y.o. M) Treating RN: Carlene Coria Primary Care Macky Galik: Jenna Luo Other Clinician: Referring Rayne Cowdrey: Jenna Luo Treating Mynor Witkop/Extender: Skipper Cliche in Treatment: 1 Visit Information History Since Last Visit All ordered tests and consults were completed: No Patient Arrived: [name] Added or deleted any medications: No Arrival Time: 13:04 Any new allergies or adverse reactions: No Accompanied By: wife Had a fall or experienced change in No Transfer Assistance: None activities of daily living that may affect Patient Identification Verified: Yes risk of falls: Secondary Verification Process Completed: Yes Signs or symptoms of abuse/neglect since last visito No Patient Requires Transmission-Based Precautions: No Hospitalized since last visit: No Patient Has Alerts: No Implantable device outside of the clinic excluding No cellular tissue based products placed in the center since last visit: Has Dressing in Place as Prescribed: Yes Pain Present Now: No Electronic Signature(s) Signed: 03/22/2022 6:13:50 PM By: Carlene Coria RN Entered By: Carlene Coria on 03/19/2022 13:08:05 Ericsson, Verne L. ([id number]) -------------------------------------------------------------------------------- Clinic Level of Care Assessment Details Patient Name: Mark Morale L. Date of Service: 03/19/2022 1:15 PM Medical Record Number: [id number] Patient Account Number: [id number] Date of Birth/Sex: 04/08/1941 (81 y.o. M) Treating RN: Carlene Coria Primary Care Crista Nuon: Jenna Luo Other Clinician: Referring Zane Pellecchia: Jenna Luo Treating Rashaun Wichert/Extender: Skipper Cliche in Treatment: 42 Clinic Level of Care  Assessment Items TOOL 1 Quantity Score []  - Use when EandM and Procedure is performed on INITIAL visit 0 ASSESSMENTS - Nursing Assessment / Reassessment []  - General Physical Exam (combine w/ comprehensive assessment (listed just below) when performed on new 0 pt. evals) []  - 0 Comprehensive Assessment (HX, ROS, Risk Assessments, Wounds Hx, etc.) ASSESSMENTS - Wound and Skin Assessment / Reassessment []  - Dermatologic / Skin Assessment (not related to wound area) 0 ASSESSMENTS - Ostomy and/or Continence Assessment and Care []  - Incontinence Assessment and Management 0 []  - 0 Ostomy Care Assessment and Management (repouching, etc.) PROCESS - Coordination of Care []  - Simple Patient / Family Education for ongoing care 0 []  - 0 Complex (extensive) Patient / Family Education for ongoing care []  - 0 Staff obtains Programmer, systems, Records, Test Results / Process Orders []  - 0 Staff telephones HHA, Nursing Homes / Clarify orders / etc []  - 0 Routine Transfer to another Facility (non-emergent condition) []  - 0 Routine Hospital Admission (non-emergent condition) []  - 0 New Admissions / Biomedical engineer / Ordering NPWT, Apligraf, etc. []  - 0 Emergency Hospital Admission (emergent condition) PROCESS - Special Needs []  - Pediatric / Minor Patient Management 0 []  - 0 Isolation Patient Management []  - 0 Hearing / Language / Visual special needs []  - 0 Assessment of Community assistance (transportation, D/C planning, etc.) []  - 0 Additional assistance / Altered mentation []  - 0 Support Surface(s) Assessment (bed, cushion, seat, etc.) INTERVENTIONS - Miscellaneous []  - External ear exam 0 []  - 0 Patient Transfer (multiple staff / Civil Service fast streamer / Similar devices) []  - 0 Simple Staple / Suture removal (25 or less) []  - 0 Complex Staple / Suture removal (26 or more) []  - 0 Hypo/Hyperglycemic Management (do not check if billed separately) []  - 0 Ankle / Brachial Index (ABI) - do not  check if billed separately Has the patient been seen at the hospital within the last three years: Yes Total Score: 0 Level  Of Care: ____ [name] ([id number]) Electronic Signature(s) Signed: 03/22/2022 6:13:50 PM By: Carlene Coria RN Entered By: Carlene Coria on 03/19/2022 13:31:57 Tomich, Syed LMarland Kitchen ([id number]) -------------------------------------------------------------------------------- Encounter Discharge Information Details Patient Name: Mark Morale L. Date of Service: 03/19/2022 1:15 PM Medical Record Number: [id number] Patient Account Number: [id number] Date of Birth/Sex: May 07, 1941 (81 y.o. M) Treating RN: Carlene Coria Primary Care Josealfredo Adkins: Jenna Luo Other Clinician: Referring Felesia Stahlecker: Jenna Luo Treating Neema Fluegge/Extender: Skipper Cliche in Treatment: 43 Encounter Discharge Information Items Post Procedure Vitals Discharge Condition: Stable Temperature (F): 97.7 Ambulatory Status: Walker Pulse (bpm): 62 Discharge Destination: Home Respiratory Rate (breaths/min): 18 Transportation: Private Auto Blood Pressure (mmHg): 127/49 Accompanied By: wife Schedule Follow-up Appointment: Yes Clinical Summary of Care: Electronic Signature(s) Signed: 03/22/2022 6:13:50 PM By: Carlene Coria RN Entered By: Carlene Coria on 03/19/2022 13:33:17 Pro, Elizah L. ([id number]) -------------------------------------------------------------------------------- Lower Extremity Assessment Details Patient Name: [name], [name] Date of Service: 03/19/2022 1:15 PM Medical Record Number: [id number] Patient Account Number: [id number] Date of Birth/Sex: October 05, 1940 (81 y.o. M) Treating RN: Carlene Coria Primary Care Paxtyn Wisdom: Jenna Luo Other Clinician: Referring Estefani Bateson: Jenna Luo Treating Geron Mulford/Extender: Skipper Cliche in Treatment: 42 Edema Assessment Assessed: [Left: No] [Right: No] Edema: [Left: Ye] [Right: s] Calf Left: Right: Point of  Measurement: 30 cm From Medial Instep 34 cm Ankle Left: Right: Point of Measurement: 10 cm From Medial Instep 20 cm Vascular Assessment Pulses: Dorsalis Pedis Palpable: [Left:Yes] Electronic Signature(s) Signed: 03/22/2022 6:13:50 PM By: Carlene Coria RN Entered By: Carlene Coria on 03/19/2022 13:15:35 Kroh, Talis L. ([id number]) -------------------------------------------------------------------------------- Multi Wound Chart Details Patient Name: Mark Morale L. Date of Service: 03/19/2022 1:15 PM Medical Record Number: [id number] Patient Account Number: [id number] Date of Birth/Sex: 12-04-1940 (81 y.o. M) Treating RN: Carlene Coria Primary Care Avary Eichenberger: Jenna Luo Other Clinician: Referring Kayah Hecker: Jenna Luo Treating Analisia Kingsford/Extender: Skipper Cliche in Treatment: 42 Vital Signs Height(in): 68 Pulse(bpm): 62 Weight(lbs): 172 Blood Pressure(mmHg): 127/49 Body Mass Index(BMI): 26.1 Temperature(F): 97.7 Respiratory Rate(breaths/min): 18 Photos: [N/A:N/A] Wound Location: Left, Posterior Lower Leg N/A N/A Wounding Event: Gradually Appeared N/A N/A Primary Etiology: Calciphylaxis N/A N/A Comorbid History: Coronary Artery Disease, Type II N/A N/A Diabetes Date Acquired: 04/05/2021 N/A N/A Weeks of Treatment: 42 N/A N/A Wound Status: Open N/A N/A Wound Recurrence: No N/A N/A Clustered Wound: Yes N/A N/A Clustered Quantity: 3 N/A N/A Measurements L x W x D (cm) 2.1x1.7x0.1 N/A N/A Area (cm) : 2.804 N/A N/A Volume (cm) : 0.28 N/A N/A % Reduction in Area: 98.40% N/A N/A % Reduction in Volume: 99.60% N/A N/A Classification: Full Thickness Without Exposed N/A N/A Support Structures Exudate Amount: Medium N/A N/A Exudate Type: Serosanguineous N/A N/A Exudate Color: red, brown N/A N/A Granulation Amount: Large (67-100%) N/A N/A Granulation Quality: Red, Hyper-granulation N/A N/A Necrotic Amount: Small (1-33%) N/A N/A Exposed Structures: Fat Layer  (Subcutaneous Tissue): N/A N/A Yes Fascia: No Tendon: No Muscle: No Joint: No Bone: No Epithelialization: Large (67-100%) N/A N/A Treatment Notes Electronic Signature(s) Signed: 03/22/2022 6:13:50 PM By: Carlene Coria RN Entered By: Carlene Coria on 03/19/2022 13:16:19 Gonterman, Francis L. ([id number]) Malveaux, Marcio Carlean Jews ([id number]) -------------------------------------------------------------------------------- [place] Details Patient Name: Mark Morale L. Date of Service: 03/19/2022 1:15 PM Medical Record Number: [id number] Patient Account Number: [id number] Date of Birth/Sex: 28-Oct-1940 (81 y.o. M) Treating RN: Carlene Coria Primary Care Canisha Issac: Jenna Luo Other Clinician: Referring Rithy Mandley: Jenna Luo Treating Josepha Barbier/Extender: Skipper Cliche in Treatment: 79 Active Inactive Wound/Skin Impairment Nursing Diagnoses: Knowledge deficit related to ulceration/compromised  skin integrity Goals: Patient/caregiver will verbalize understanding of skin care regimen Date Initiated: 05/24/2021 Target Resolution Date: 04/19/2022 Goal Status: Active Ulcer/skin breakdown will have a volume reduction of 30% by week 4 Date Initiated: 05/24/2021 Date Inactivated: 08/14/2021 Target Resolution Date: 07/24/2021 Goal Status: Unmet Unmet Reason: comorbites Ulcer/skin breakdown will have a volume reduction of 50% by week 8 Date Initiated: 05/24/2021 Date Inactivated: 09/25/2021 Target Resolution Date: 08/24/2021 Goal Status: Unmet Unmet Reason: comorbities Ulcer/skin breakdown will have a volume reduction of 80% by week 12 Date Initiated: 05/24/2021 Date Inactivated: 09/25/2021 Target Resolution Date: 09/24/2021 Goal Status: Unmet Unmet Reason: comorbities Ulcer/skin breakdown will heal within 14 weeks Date Initiated: 05/24/2021 Date Inactivated: 10/30/2021 Target Resolution Date: 10/22/2021 Goal Status: Unmet Unmet Reason:  comorbities Interventions: Assess patient/caregiver ability to obtain necessary supplies Assess patient/caregiver ability to perform ulcer/skin care regimen upon admission and as needed Assess ulceration(s) every visit Notes: Electronic Signature(s) Signed: 03/22/2022 6:13:50 PM By: Carlene Coria RN Entered By: Carlene Coria on 03/19/2022 13:16:10 Degan, Cristofher L. ([id number]) -------------------------------------------------------------------------------- Pain Assessment Details Patient Name: Mark Morale L. Date of Service: 03/19/2022 1:15 PM Medical Record Number: [id number] Patient Account Number: [id number] Date of Birth/Sex: 1941/05/17 (81 y.o. M) Treating RN: Carlene Coria Primary Care Akshaj Besancon: Jenna Luo Other Clinician: Referring Danniel Tones: Jenna Luo Treating Kiernan Farkas/Extender: Skipper Cliche in Treatment: 42 Active Problems Location of Pain Severity and Description of Pain Patient Has Paino No Site Locations Pain Management and Medication Current Pain Management: Electronic Signature(s) Signed: 03/22/2022 6:13:50 PM By: Carlene Coria RN Entered By: Carlene Coria on 03/19/2022 13:08:29 Borner, Forrester L. ([id number]) -------------------------------------------------------------------------------- Patient/Caregiver Education Details Patient Name: Mark Morale L. Date of Service: 03/19/2022 1:15 PM Medical Record Number: [id number] Patient Account Number: [id number] Date of Birth/Gender: Sep 27, 1940 (81 y.o. M) Treating RN: Carlene Coria Primary Care Physician: Jenna Luo Other Clinician: Referring Physician: Jenna Luo Treating Physician/Extender: Skipper Cliche in Treatment: 19 Education Assessment Education Provided To: Patient Education Topics Provided Wound/Skin Impairment: Methods: Explain/Verbal Responses: State content correctly Electronic Signature(s) Signed: 03/22/2022 6:13:50 PM By: Carlene Coria RN Entered By: Carlene Coria on  03/19/2022 13:32:10 Deliz, Eiden L. ([id number]) -------------------------------------------------------------------------------- Wound Assessment Details Patient Name: Eblin, Milas L. Date of Service: 03/19/2022 1:15 PM Medical Record Number: [id number] Patient Account Number: [id number] Date of Birth/Sex: 04-27-1941 (82 y.o. M) Treating RN: Carlene Coria Primary Care Sharlyn Odonnel: Jenna Luo Other Clinician: Referring Cathryne Mancebo: Jenna Luo Treating Polo Mcmartin/Extender: Skipper Cliche in Treatment: 42 Wound Status Wound Number: 1 Primary Etiology: Calciphylaxis Wound Location: Left, Posterior Lower Leg Wound Status: Open Wounding Event: Gradually Appeared Comorbid History: Coronary Artery Disease, Type II Diabetes Date Acquired: 04/05/2021 Weeks Of Treatment: 42 Clustered Wound: Yes Photos Wound Measurements Length: (cm) 2.1 Width: (cm) 1.7 Depth: (cm) 0.1 Clustered Quantity: 3 Area: (cm) 2.804 Volume: (cm) 0.28 % Reduction in Area: 98.4% % Reduction in Volume: 99.6% Epithelialization: Large (67-100%) Tunneling: No Undermining: No Wound Description Classification: Full Thickness Without Exposed Support Structu Exudate Amount: Medium Exudate Type: Serosanguineous Exudate Color: red, brown res Foul Odor After Cleansing: No Slough/Fibrino Yes Wound Bed Granulation Amount: Large (67-100%) Exposed Structure Granulation Quality: Red, Hyper-granulation Fascia Exposed: No Necrotic Amount: Small (1-33%) Fat Layer (Subcutaneous Tissue) Exposed: Yes Necrotic Quality: Adherent Slough Tendon Exposed: No Muscle Exposed: No Joint Exposed: No Bone Exposed: No Treatment Notes Wound #1 (Lower Leg) Wound Laterality: Left, Posterior Cleanser Dakin 16 (oz) 0.25 Discharge Instruction: Use as directed. Peri-Wound Care NAYIB, REMER L. ([id number]) Topical Primary Dressing Gauze Discharge Instruction: As directed: moistened with Dakins  Solution Secondary  Dressing Kerlix 4.5 x 4.1 (in/yd) Discharge Instruction: Apply Kerlix 4.5 x 4.1 (in/yd) as instructed Secured With Medipore Tape - 42M Medipore H Soft Cloth Surgical Tape, 2x2 (in/yd) Compression Wrap Compression Stockings Add-Ons Electronic Signature(s) Signed: 03/22/2022 6:13:50 PM By: Carlene Coria RN Entered By: Carlene Coria on 03/19/2022 13:14:23 Galati, Cinsere L. ([id number]) -------------------------------------------------------------------------------- Vitals Details Patient Name: Mark Morale L. Date of Service: 03/19/2022 1:15 PM Medical Record Number: [id number] Patient Account Number: [id number] Date of Birth/Sex: Sep 29, 1940 (81 y.o. M) Treating RN: Carlene Coria Primary Care Cali Hope: Jenna Luo Other Clinician: Referring Atzin Buchta: Jenna Luo Treating Kenni Newton/Extender: Skipper Cliche in Treatment: 42 Vital Signs Time Taken: 13:08 Temperature (F): 97.7 Height (in): 68 Pulse (bpm): 62 Weight (lbs): 172 Respiratory Rate (breaths/min): 18 Body Mass Index (BMI): 26.1 Blood Pressure (mmHg): 127/49 Reference Range: 80 - 120 mg / dl Electronic Signature(s) Signed: 03/22/2022 6:13:50 PM By: Carlene Coria RN Entered By: Carlene Coria on 03/19/2022 87:27:61

## 2022-03-21 NOTE — Progress Notes (Addendum)
ANTRONE, WALLA ([id number]) Visit Report for 03/19/2022 Chief Complaint Document Details Patient Name: [name], [name]. Date of Service: 03/19/2022 1:15 PM Medical Record Number: [id number] Patient Account Number: [id number] Date of Birth/Sex: 04-Mar-1941 (81 y.o. 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 Date of Service: 03/19/2022 1:15 PM Medical Record Number: [id number] Patient Account Number: [id number] Date of Birth/Sex: 1941/05/10 (81 y.o. M) Treating RN: Carlene Coria Primary Care Provider: Jenna Luo Other Clinician: Referring Provider: Jenna Luo Treating Provider/Extender: Skipper Cliche in Treatment: 42 Debridement Performed for Wound #1 Left,Posterior Lower Leg Assessment: Performed By: Physician Tommie Sams., PA-C Debridement Type: Debridement Level of Consciousness (Pre- Awake and Alert procedure): Pre-procedure Verification/Time Out Yes - 13:29 Taken: Start Time: 13:29 Pain Control: Lidocaine 4% Topical Solution Total Area Debrided (L x W): 1 (cm) x 0.5 (cm) = 0.5 (cm) Tissue and other material Viable, Non-Viable, Slough, Subcutaneous, Skin: Dermis , Skin: Epidermis, Slough debrided: Level: Skin/Subcutaneous Tissue Debridement Description: Excisional Instrument: Curette Bleeding: Minimum Hemostasis Achieved: Pressure End Time: 13:31 Procedural Pain: 0 Post Procedural Pain: 0 Response to Treatment:  Procedure was tolerated well Level of Consciousness (Post- Awake and Alert procedure): Post Debridement Measurements of Total Wound Length: (cm) 2.1 Width: (cm) 1.7 Depth: (cm) 0.1 Volume: (cm) 0.28 Character of Wound/Ulcer Post Debridement: Improved Post Procedure Diagnosis Same as Pre-procedure Electronic Signature(s) Signed: 03/21/2022 5:21:19 PM By: Worthy Keeler PA-C Signed: 03/22/2022 6:13:50 PM By: Carlene Coria RN Entered By: Carlene Coria on 03/19/2022 13:31:24 Nebel, Giovanny L. ([id number]) -------------------------------------------------------------------------------- HPI Details Patient Name: [name], Zidane L. Date of Service: 03/19/2022 1:15 PM Medical Record Number: [id number] Patient Account Number: [id number] Date of Birth/Sex: 1941/05/25 (81 y.o. 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 This has been present for a couple of months currently he is on the sodium thiosulfate at dialysis. He tells me that they picked up on this quickly and have been managing it but he really has not had any help with anything else from the standpoint of progressing the wound itself. The patient does have again end-stage renal disease which currently is listed as stage IV but he is on renal dialysis. He also has coronary artery disease as well as going shortly for formal arterial studies although he did have a quick test of the tibial artery with his vascular doctor when he went in to check on his fistula for his arm and subsequently they did not feel like he had any hemodynamically unstable flow into the lower  extremity this is good news. Nonetheless I think that the biggest issue here is going to be getting some of the eschar off so that we get the wounds med headed in the appropriate direction. 05/31/2021 upon evaluation today patient appears to be doing well with regard to his leg ulceration with calciphylaxis. I am very pleased in this regard. Fortunately there  does not appear to be any signs of active infection at this time which is great news. No fevers, chills, nausea, vomiting, or diarrhea. 06/07/2021 upon evaluation today patient appears to be doing well with regard to his legs. Unfortunately he is having an issue here with his penis as well he did mention at the end of last visit. I suggested that he go see urology. However when he went to see the urologist they basically told him there was not anything they could do. They recommended that we needed to be the ones to take care of this. With that being said this is an unusual area that to be honest a lot of the traditional wound care products we use are not really good to be good for this region. Potentially Medihoney alginate could be a possibility also think Santyl could be a possibility. With that being said I think initially my suggestion is probably can be for [place] to attempt the Santyl to see if that will benefit the patient. 06/21/2021 upon evaluation today patient's wound bed actually showed signs of good granulation and epithelization at this point. Fortunately I do not see any signs of active infection systemically which is great news and his legs are doing awesome I think you are making great progress. The Santyl on the glans penis also has been of benefit this is looking tremendously better. Again the patient is pleased he also tells me the pain is significantly improved. Overall I think we are headed in the appropriate direction here. 07/05/2021 upon evaluation today patient appears to be doing well with regard to his wounds. Both the area  on the tip of his penis as well as the left lateral leg is doing well. There is some debridement this can be needed over the leg region. Fortunately I think that overall the Dakin's moistened gauze has done a great job here. No fevers, chills, nausea, vomiting, or diarrhea. 07/12/2021 upon evaluation today patient presents for reevaluation here in the clinic and overall I think that his wounds are doing significantly better which is great news. This includes both the left leg as well as the glans of the penis. Both are showing signs of significant improvement which is great and overall I think that we are headed in the right direction. I do not see any evidence of active infection at this moment. 07/19/2021 upon evaluation today patient appears to be doing better in regard to his wounds. Were getting much closer to complete resolution in regard to the necrotic tissue being removed from the leg. Overall I am extremely pleased with where we stand today. Overall I do not see any signs of active infection at this time. With regard to the patient's penis this area is showing signs of improvement as well and I am very pleased with that. 07/26/2021 upon evaluation today patient appears to be doing well with regard to his wounds. The leg in fact is looking quite well and I am very pleased with where we stand today. I do not see any signs of infection which is great and there is a lot of new granulation growth also also. With regard to the penis area this also showed signs of excellent improvement which is great news. 08/14/2020 upon evaluation today patient appears to be doing well with regard to his wounds both the leg as well as the glans penis region. Both are showing signs of improvement which is great news and overall I am extremely pleased with where we stand  at this point. I think that the Dakin's moistened gauze dressing for her leg is doing great and the Santyl is definitely helping with the glans  penis location. 08/28/2021 upon evaluation today patient appears to be doing well with regard to his wounds on the left leg as well as the penis. Fortunately both are showing signs of improvement. I am going to perform some debridement in regard to the leg there is a small area which is having trouble getting completely clean my work on that a little bit more today. 09/04/2021 upon evaluation today patient appears to be doing pretty well in regard to his wounds. 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 Again he has I just found out today been off of the sodium thiosulfate for about 2 to 3 weeks. He tells me that the initial "3 weeks" was stated to be up by the nurse. With that being said I think he probably is going to need it for a bit longer to be honest. We discussed that today. Over the penis area the Santyl does seem to be loose and a lot of this up and that is good hopefully will be able to get this completely cleared off it sometime shortly. 09/13/2021 upon evaluation today patient appears to be doing well with regard to his wound. He has been tolerating the dressing changes without complication. Fortunately there does not appear to be any signs of active infection locally or systemically which is great news. Overall I think that he is making progress with regard to his wounds in general. 09/25/2021 upon evaluation today patient appears to be doing excellent in regard to his wounds. I am actually extremely pleased with where we stand today and I think he is making great progress. The patient likewise is happy to hear this. 10/09/2021 upon evaluation today patient actually appears to be doing excellent in regard to his wound. He has been tolerating the dressing changes without complication. Fortunately I do not see any evidence of active infection locally or systemically which is great news. No  fevers, Angert, Zuhair L. ([id number]) chills, nausea, vomiting, or diarrhea. 3/14; patient presents for follow-up. He has no issues or complaints today. 10/23/2021 upon evaluation today patient appears to be doing well with regard to his wounds. In fact the penis appears to be pretty much healed there may be a point when opening but not much at all to be perfectly honest. In regard to the leg I Flygt were definitely seeing signs of significant improvement which is great news. 10/30/2021 upon evaluation today patient appears to be doing well with regard to his wounds. In fact the penis area is completely healed which is great news that just the leg that were dealing with and this is significantly smaller. I am actually extremely pleased with where we stand. 11-06-2021 upon evaluation today patient appears to be doing well with regard to his leg ulcers. All are measuring smaller and looking great. I do not see any signs of infection which is great news. No fever chills noted 11-13-2021 upon evaluation today patient appears to be doing well with regard to his wounds everything is still measuring smaller and looking excellent. I am extremely pleased with where we stand today. There does not appear to be any signs of active infection at this time. No fevers, chills, nausea, vomiting, or diarrhea. 11-20-2021 upon evaluation  today patient actually appears to be doing quite well in regard to his wounds. Has been tolerating the dressing changes without complication. Fortunately I do not see any evidence of active infection locally or systemically at this time which is great news. No fevers, chills, nausea, vomiting, or diarrhea. I think were definitely headed in the right direction here. 12-04-2021 upon evaluation today patient appears to be doing well with regard to his wounds. Overall things seem to be measuring smaller and doing much better. Fortunately there does not appear to be any evidence of active  infection locally nor systemically which is great news. No fevers, chills, nausea, vomiting, or diarrhea. 12/11/2021 upon evaluation today patient actually appears to be doing great in regard to his wounds he has been tolerating the dressing changes without complication. Fortunately there does not appear to be any evidence of active infection locally or systemically which is great news. No fevers, chills, nausea, vomiting, or diarrhea. 12-25-2021 upon evaluation today patient appears to be doing excellent in regard to his leg ulcers he has been tolerating the dressing changes and fortunately I do not see any signs of active infection locally or systemically at this time which is great news. Overall I do believe that we are headed in the appropriate direction here. I do not see any evidence that this is worsening in general which is great news. 01-08-2022 upon evaluation today patient's leg is almost completely closed on the lateral aspect of the posterior is doing awesome. I do not see any evidence of infection and overall I think he is really making excellent progress here. Overall I think we are on the right track. 01-24-2022 upon evaluation today patient's wound is actually showing signs of excellent improvement he did have 1 small area that reopened it looks as if he may have bumped this on something I am not 100% sure. With that being said he overall seems to be doing quite well and I am very pleased with where things stand currently I see no signs of active infection at this time which is great news. 02-07-2022 upon evaluation today patient is making excellent progress his wounds are measuring smaller looking much better and overall I am extremely pleased with where things stand currently. I do not see any evidence of active infection locally or systemically which is great news. Upon inspection patient's wound bed actually showed signs of good granulation and epithelization at this point. Fortunately I  think that he is doing awesome with regard to his healing and I am very pleased in that regard. I think that we are on the right track. 02-19-2022 upon evaluation today patient appears to be doing well currently in regard to her wound. She has been tolerating the dressing changes without complication. Fortunately there does not appear to be any evidence of active infection locally or systemically which is great news. No fevers, chills, nausea, vomiting, or diarrhea. 03-05-2022 patient appears to be doing very well in regard to his leg ulcer. The area that is remaining is currently extremely small and seems to be making great progress. I do not see any evidence of active infection locally or systemically at this time which is great news. No fevers, chills, nausea, vomiting, or diarrhea. 03-19-2022 upon evaluation today patient appears to be doing excellent in regard to his wound. In fact this is very close to getting completely closed. Fortunately I do not see any evidence of active infection locally or systemically at this time which is great news.  No fevers, chills, nausea, vomiting, or diarrhea. Electronic Signature(s) Signed: 03/19/2022 2:16:41 PM By: Worthy Keeler PA-C Entered By: Worthy Keeler on 03/19/2022 14:16:40 Emory, [name] ([id number]) -------------------------------------------------------------------------------- Physical Exam Details Patient Name: Dunbar, Nyair L. Date of Service: 03/19/2022 1:15 PM Medical Record Number: [id number] Patient Account Number: [id number] Date of Birth/Sex: October 31, 1940 (81 y.o. M) Treating RN: Carlene Coria Primary Care Provider: Jenna Luo Other Clinician: Referring Provider: Jenna Luo Treating Provider/Extender: Skipper Cliche in Treatment: 54 Constitutional Well-nourished and well-hydrated in no acute distress. Respiratory normal breathing without difficulty. Psychiatric this patient is able to make decisions and demonstrates  good insight into disease process. Alert and Oriented x 3. pleasant and cooperative. Notes Upon inspection patient's wound bed actually showed signs of good granulation and epithelization at this point. There does not appear to be any signs of active infection locally or systemically which is great news. No fevers, chills, nausea, vomiting, or diarrhea. Electronic Signature(s) Signed: 03/19/2022 2:16:58 PM By: Worthy Keeler PA-C Entered By: Worthy Keeler on 03/19/2022 14:16:58 Bouvier, [name] ([id number]) -------------------------------------------------------------------------------- Physician Orders Details Patient Name: Mark Morale L. Date of Service: 03/19/2022 1:15 PM Medical Record Number: [id number] Patient Account Number: [id number] Date of Birth/Sex: [birth year] (81 y.o. M) Treating RN: Carlene Coria Primary Care Provider: Jenna Luo Other Clinician: Referring Provider: Jenna Luo Treating Provider/Extender: Skipper Cliche in Treatment: 27 Verbal / Phone Orders: No Diagnosis Coding ICD-10 Coding Code Description E83.50 Unspecified disorder of calcium metabolism L97.822 Non-pressure chronic ulcer of other part of left lower leg with fat layer exposed L98.492 Non-pressure chronic ulcer of skin of other sites with fat layer exposed Z99.2 Dependence on renal dialysis N18.4 Chronic kidney disease, stage 4 (severe) I25.10 Atherosclerotic heart disease of native coronary artery without angina pectoris Follow-up Appointments o Return Appointment in 2 weeks. Anesthetic (Use 'Patient Medications' Section for Anesthetic Order Entry) o Lidocaine applied to wound bed Edema Control - Lymphedema / Segmental Compressive Device / Other o Elevate, Exercise Daily and Avoid Standing for Long Periods of Time. o Elevate legs to the level of the heart and pump ankles as often as possible o Elevate leg(s) parallel to the floor when sitting. Medications-Please add to  medication list. o Other: - AandD ointment to leg Wound Treatment Wound #1 - Lower Leg Wound Laterality: Left, Posterior Cleanser: Dakin 16 (oz) 0.25 1 x Per Day/30 Days Discharge Instructions: Use as directed. Primary Dressing: Gauze 1 x Per Day/30 Days Discharge Instructions: As directed: moistened with Dakins Solution Secondary Dressing: Kerlix 4.5 x 4.1 (in/yd) (Generic) 1 x Per Day/30 Days Discharge Instructions: Apply Kerlix 4.5 x 4.1 (in/yd) as instructed Secured With: Medipore Tape - 1M Medipore H Soft Cloth Surgical Tape, 2x2 (in/yd) (Generic) 1 x Per Day/30 Days Electronic Signature(s) Signed: 03/21/2022 5:21:19 PM By: Worthy Keeler PA-C Signed: 03/22/2022 6:13:50 PM By: Carlene Coria RN Entered By: Carlene Coria on 03/19/2022 13:32:36 Beeghly, Emari L. ([id number]) -------------------------------------------------------------------------------- Problem List Details Patient Name: Shorb, Alante L. Date of Service: 03/19/2022 1:15 PM Medical Record Number: [id number] Patient Account Number: [id number] Date of Birth/Sex: [birth year] (81 y.o. M) Treating RN: Carlene Coria Primary Care Provider: Jenna Luo Other Clinician: Referring Provider: Jenna Luo Treating Provider/Extender: Skipper Cliche in Treatment: 42 Active Problems ICD-10 Encounter Code Description Active Date MDM Diagnosis E83.50 Unspecified disorder of calcium metabolism 05/24/2021 No Yes L97.822 Non-pressure chronic ulcer of other part of left lower leg with fat layer 05/24/2021 No Yes exposed L98.492 Non-pressure chronic ulcer of skin  of other sites with fat layer exposed 06/07/2021 No Yes Z99.2 Dependence on renal dialysis 05/24/2021 No Yes N18.4 Chronic kidney disease, stage 4 (severe) 05/24/2021 No Yes I25.10 Atherosclerotic heart disease of native coronary artery without angina 05/24/2021 No Yes pectoris Inactive Problems Resolved Problems Electronic Signature(s) Signed: 03/19/2022 1:07:57  PM By: Worthy Keeler PA-C Entered By: Worthy Keeler on 03/19/2022 13:07:56 Alphin, Cidra ([id number]) -------------------------------------------------------------------------------- Progress Note Details Patient Name: Clonch, Gadiel L. Date of Service: 03/19/2022 1:15 PM Medical Record Number: [id number] Patient Account Number: [id number] Date of Birth/Sex: 1941/07/09 (81 y.o. 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 This has been present for a couple of months currently he is on the sodium thiosulfate at dialysis. He tells me that they picked up on this quickly and have been managing it but he really has not had any help with anything else from the standpoint of progressing the wound itself. The patient does have again end-stage renal disease which currently is listed as stage IV but he is on renal dialysis. He also has coronary artery disease as well as going shortly for formal arterial studies although he did have a quick test of the tibial artery with his vascular doctor when he went in to check on his fistula for his arm and subsequently they did not feel like he had any hemodynamically unstable flow into the lower extremity this is good news. Nonetheless I think that the biggest issue here is going to be getting some of the eschar off so that we get the wounds med headed in the appropriate direction. 05/31/2021 upon evaluation today patient appears to be doing well with regard to his leg ulceration with  calciphylaxis. I am very pleased in this regard. Fortunately there does not appear to be any signs of active infection at this time which is great news. No fevers, chills, nausea, vomiting, or diarrhea. 06/07/2021 upon evaluation today patient appears to be doing well with regard to his legs. Unfortunately he is having an issue here with his penis as well he did mention at the end of last visit. I suggested that he go see urology. However when he went to see the urologist they basically told him there was not anything they could do. They recommended that we needed to be the ones to take care of this. With that being said this is an unusual area that to be honest a lot of the traditional wound care products we use are not really good to be good for this region. Potentially Medihoney alginate could be a possibility also think Santyl could be a possibility. With that being said I think initially my suggestion is probably can be for [place] to attempt the Santyl to see if that will benefit the patient. 06/21/2021 upon evaluation today patient's wound bed actually showed signs of good granulation and epithelization at this point. Fortunately I do not see any signs of active infection systemically which is great news and his legs are doing awesome I think you are making great progress. The Santyl on the glans penis also has been of benefit this is looking tremendously better.  Again the patient is pleased he also tells me the pain is significantly improved. Overall I think we are headed in the appropriate direction here. 07/05/2021 upon evaluation today patient appears to be doing well with regard to his wounds. Both the area on the tip of his penis as well as the left lateral leg is doing well. There is some debridement this can be needed over the leg region. Fortunately I think that overall the Dakin's moistened gauze has done a great job here. No fevers, chills, nausea, vomiting, or diarrhea. 07/12/2021 upon  evaluation today patient presents for reevaluation here in the clinic and overall I think that his wounds are doing significantly better which is great news. This includes both the left leg as well as the glans of the penis. Both are showing signs of significant improvement which is great and overall I think that we are headed in the right direction. I do not see any evidence of active infection at this moment. 07/19/2021 upon evaluation today patient appears to be doing better in regard to his wounds. Were getting much closer to complete resolution in regard to the necrotic tissue being removed from the leg. Overall I am extremely pleased with where we stand today. Overall I do not see any signs of active infection at this time. With regard to the patient's penis this area is showing signs of improvement as well and I am very pleased with that. 07/26/2021 upon evaluation today patient appears to be doing well with regard to his wounds. The leg in fact is looking quite well and I am very pleased with where we stand today. I do not see any signs of infection which is great and there is a lot of new granulation growth also also. With regard to the penis area this also showed signs of excellent improvement which is great news. 08/14/2020 upon evaluation today patient appears to be doing well with regard to his wounds both the leg as well as the glans penis region. Both are showing signs of improvement which is great news and overall I am extremely pleased with where we stand at this point. I think that the Dakin's moistened gauze dressing for her leg is doing great and the Santyl is definitely helping with the glans penis location. 08/28/2021 upon evaluation today patient appears to be doing well with regard to his wounds on the left leg as well as the penis. Fortunately both are showing signs of improvement. I am going to perform some debridement in regard to the leg there is a small area which is having  trouble getting completely clean my work on that a little bit more today. 09/04/2021 upon evaluation today patient appears to be doing pretty well in regard to his wounds. 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 Again he has I just found out today been off of the sodium thiosulfate for about 2 to 3 weeks. He tells me that the initial "3 weeks" was stated to be up by the nurse. With that being said I think he probably is going to need it for a bit longer to be honest. We discussed that today. Over the penis area the Santyl does seem to be loose and a lot of this up and that is good hopefully will be able to get this completely cleared off it sometime shortly. 09/13/2021 upon evaluation today  patient appears to be doing well with regard to his wound. He has been tolerating the dressing changes without complication. Fortunately there does not appear to be any signs of active infection locally or systemically which is great news. Overall I think that he is making progress with regard to his wounds in general. Capek, Olusegun L. ([id number]) 09/25/2021 upon evaluation today patient appears to be doing excellent in regard to his wounds. I am actually extremely pleased with where we stand today and I think he is making great progress. The patient likewise is happy to hear this. 10/09/2021 upon evaluation today patient actually appears to be doing excellent in regard to his wound. He has been tolerating the dressing changes without complication. Fortunately I do not see any evidence of active infection locally or systemically which is great news. No fevers, chills, nausea, vomiting, or diarrhea. 3/14; patient presents for follow-up. He has no issues or complaints today. 10/23/2021 upon evaluation today patient appears to be doing well with regard to his wounds. In fact the penis appears to be pretty much  healed there may be a point when opening but not much at all to be perfectly honest. In regard to the leg I Flygt were definitely seeing signs of significant improvement which is great news. 10/30/2021 upon evaluation today patient appears to be doing well with regard to his wounds. In fact the penis area is completely healed which is great news that just the leg that were dealing with and this is significantly smaller. I am actually extremely pleased with where we stand. 11-06-2021 upon evaluation today patient appears to be doing well with regard to his leg ulcers. All are measuring smaller and looking great. I do not see any signs of infection which is great news. No fever chills noted 11-13-2021 upon evaluation today patient appears to be doing well with regard to his wounds everything is still measuring smaller and looking excellent. I am extremely pleased with where we stand today. There does not appear to be any signs of active infection at this time. No fevers, chills, nausea, vomiting, or diarrhea. 11-20-2021 upon evaluation today patient actually appears to be doing quite well in regard to his wounds. Has been tolerating the dressing changes without complication. Fortunately I do not see any evidence of active infection locally or systemically at this time which is great news. No fevers, chills, nausea, vomiting, or diarrhea. I think were definitely headed in the right direction here. 12-04-2021 upon evaluation today patient appears to be doing well with regard to his wounds. Overall things seem to be measuring smaller and doing much better. Fortunately there does not appear to be any evidence of active infection locally nor systemically which is great news. No fevers, chills, nausea, vomiting, or diarrhea. 12/11/2021 upon evaluation today patient actually appears to be doing great in regard to his wounds he has been tolerating the dressing changes without complication. Fortunately there does not  appear to be any evidence of active infection locally or systemically which is great news. No fevers, chills, nausea, vomiting, or diarrhea. 12-25-2021 upon evaluation today patient appears to be doing excellent in regard to his leg ulcers he has been tolerating the dressing changes and fortunately I do not see any signs of active infection locally or systemically at this time which is great news. Overall I do believe that we are headed in the appropriate direction here. I do not see any evidence that this is worsening in general which is great news.  01-08-2022 upon evaluation today patient's leg is almost completely closed on the lateral aspect of the posterior is doing awesome. I do not see any evidence of infection and overall I think he is really making excellent progress here. Overall I think we are on the right track. 01-24-2022 upon evaluation today patient's wound is actually showing signs of excellent improvement he did have 1 small area that reopened it looks as if he may have bumped this on something I am not 100% sure. With that being said he overall seems to be doing quite well and I am very pleased with where things stand currently I see no signs of active infection at this time which is great news. 02-07-2022 upon evaluation today patient is making excellent progress his wounds are measuring smaller looking much better and overall I am extremely pleased with where things stand currently. I do not see any evidence of active infection locally or systemically which is great news. Upon inspection patient's wound bed actually showed signs of good granulation and epithelization at this point. Fortunately I think that he is doing awesome with regard to his healing and I am very pleased in that regard. I think that we are on the right track. 02-19-2022 upon evaluation today patient appears to be doing well currently in regard to her wound. She has been tolerating the dressing changes without  complication. Fortunately there does not appear to be any evidence of active infection locally or systemically which is great news. No fevers, chills, nausea, vomiting, or diarrhea. 03-05-2022 patient appears to be doing very well in regard to his leg ulcer. The area that is remaining is currently extremely small and seems to be making great progress. I do not see any evidence of active infection locally or systemically at this time which is great news. No fevers, chills, nausea, vomiting, or diarrhea. 03-19-2022 upon evaluation today patient appears to be doing excellent in regard to his wound. In fact this is very close to getting completely closed. Fortunately I do not see any evidence of active infection locally or systemically at this time which is great news. No fevers, chills, nausea, vomiting, or diarrhea. Objective Constitutional Well-nourished and well-hydrated in no acute distress. Vitals Time Taken: 1:08 PM, Height: 68 in, Weight: 172 lbs, BMI: 26.1, Temperature: 97.7 F, Pulse: 62 bpm, Respiratory Rate: 18 breaths/min, Rosemeyer, Orazio L. ([id number]) Blood Pressure: 127/49 mmHg. Respiratory normal breathing without difficulty. Psychiatric this patient is able to make decisions and demonstrates good insight into disease process. Alert and Oriented x 3. pleasant and cooperative. General Notes: Upon inspection patient's wound bed actually showed signs of good granulation and epithelization at this point. There does not appear to be any signs of active infection locally or systemically which is great news. No fevers, chills, nausea, vomiting, or diarrhea. Integumentary (Hair, Skin) Wound #1 status is Open. Original cause of wound was Gradually Appeared. The date acquired was: 04/05/2021. The wound has been in treatment 42 weeks. The wound is located on the Left,Posterior Lower Leg. The wound measures 2.1cm length x 1.7cm width x 0.1cm depth; 2.804cm^2 area and 0.28cm^3 volume. There is Fat  Layer (Subcutaneous Tissue) exposed. There is no tunneling or undermining noted. There is a medium amount of serosanguineous drainage noted. There is large (67-100%) red, hyper - granulation within the wound bed. There is a small (1-33%) amount of necrotic tissue within the wound bed including Adherent Slough. 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 There was a Excisional Skin/Subcutaneous Tissue Debridement with a total area of 0.5 sq cm performed by Tommie Sams., PA-C. With the following instrument(s): Curette to remove Viable and Non-Viable tissue/material. Material removed includes Subcutaneous Tissue, Slough, Skin: Dermis, and Skin: Epidermis after achieving pain control using Lidocaine 4% Topical Solution. No specimens were taken. A time out was conducted at 13:29, prior to the start of the procedure. A Minimum amount of bleeding was controlled with Pressure. The procedure was tolerated well with a pain level of 0 throughout and a pain level of 0 following the procedure. Post Debridement Measurements: 2.1cm length x 1.7cm width x 0.1cm depth; 0.28cm^3 volume. Character of Wound/Ulcer Post Debridement is improved. Post procedure Diagnosis Wound #1: Same as Pre-Procedure Plan Follow-up Appointments: Return Appointment in 2 weeks. Anesthetic (Use 'Patient Medications' Section for Anesthetic Order Entry): Lidocaine applied to wound bed Edema Control - Lymphedema / Segmental Compressive Device / Other: Elevate, Exercise Daily and Avoid  Standing for Long Periods of Time. Elevate legs to the level of the heart and pump ankles as often as possible Elevate leg(s) parallel to the floor when sitting. Medications-Please add to medication list.: Other: - AandD ointment to leg WOUND #1: - Lower Leg Wound Laterality: Left, Posterior Cleanser: Dakin 16 (oz) 0.25 1 x Per Day/30 Days Discharge Instructions: Use as directed. Primary Dressing: Gauze 1 x Per Day/30 Days Discharge Instructions: As directed: moistened with Dakins Solution Secondary Dressing: Kerlix 4.5 x 4.1 (in/yd) (Generic) 1 x Per Day/30 Days Virts, Daelon L. ([id number]) Discharge Instructions: Apply Kerlix 4.5 x 4.1 (in/yd) as instructed Secured With: Medipore Tape - 32M Medipore H Soft Cloth Surgical Tape, 2x2 (in/yd) (Generic) 1 x Per Day/30 Days 1. I did have to perform a little bit of debridement of clearway a piece of skin that was getting pulled back and pulling on some of the good skin this other part was dead skin. I was able to clear this away postdebridement this appears to be doing much better I am very pleased with where things stand currently. 2. I would recommend that we continue with the Dakin's moistened gauze dressing. We will see patient back for reevaluation in 2 weeks here in the clinic. If anything worsens or changes patient will contact our office for additional recommendations. Electronic Signature(s) Signed: 03/19/2022 2:17:31 PM By: Worthy Keeler PA-C Entered By: Worthy Keeler on 03/19/2022 14:17:31 Balow, [name] ([id number]) -------------------------------------------------------------------------------- SuperBill Details Patient Name: Mark Morale L. Date of Service: 03/19/2022 Medical Record Number: [id number] Patient Account Number: [id number] Date of Birth/Sex: Jun 22, 1941 (80 y.o. M) Treating RN: Carlene Coria Primary Care Provider: Jenna Luo Other Clinician: Referring Provider: Jenna Luo Treating  Provider/Extender: Skipper Cliche in Treatment: 42 Diagnosis Coding ICD-10 Codes Code Description E83.50 Unspecified disorder of calcium metabolism L97.822 Non-pressure chronic ulcer of other part of left lower leg with fat layer exposed L98.492 Non-pressure chronic ulcer of skin of other sites with fat layer exposed Z99.2 Dependence on renal dialysis N18.4 Chronic kidney disease, stage 4 (severe) I25.10 Atherosclerotic heart disease of native coronary artery without angina pectoris Facility Procedures CPT4 Code: 08676195 Description: [facility]  TISSUE 20 SQ CM/< Modifier: Quantity: 1 CPT4 Code: Description: ICD-10 Diagnosis Description L97.822 Non-pressure chronic ulcer of other part of left lower leg with fat layer Modifier: exposed Quantity: Physician Procedures CPT4 Code: 0871994 Description: 11042 - WC PHYS SUBQ TISS 20 SQ CM Modifier: Quantity: 1 CPT4 Code: Description: ICD-10 Diagnosis Description L97.822 Non-pressure chronic ulcer of other part of left lower leg with fat layer Modifier: exposed Quantity: Electronic Signature(s) Signed: 03/19/2022 2:17:49 PM By: Worthy Keeler PA-C Entered By: Worthy Keeler on 03/19/2022 14:17:49

## 2022-03-25 DIAGNOSIS — D631 Anemia in chronic kidney disease: Secondary | ICD-10-CM | POA: Diagnosis not present

## 2022-03-25 DIAGNOSIS — N186 End stage renal disease: Secondary | ICD-10-CM | POA: Diagnosis not present

## 2022-03-25 DIAGNOSIS — D689 Coagulation defect, unspecified: Secondary | ICD-10-CM | POA: Diagnosis not present

## 2022-03-25 DIAGNOSIS — N2581 Secondary hyperparathyroidism of renal origin: Secondary | ICD-10-CM | POA: Diagnosis not present

## 2022-03-25 DIAGNOSIS — E876 Hypokalemia: Secondary | ICD-10-CM | POA: Diagnosis not present

## 2022-03-25 DIAGNOSIS — Z992 Dependence on renal dialysis: Secondary | ICD-10-CM | POA: Diagnosis not present

## 2022-03-30 DIAGNOSIS — N186 End stage renal disease: Secondary | ICD-10-CM | POA: Diagnosis not present

## 2022-03-30 DIAGNOSIS — G9341 Metabolic encephalopathy: Secondary | ICD-10-CM | POA: Diagnosis not present

## 2022-03-30 DIAGNOSIS — A419 Sepsis, unspecified organism: Secondary | ICD-10-CM | POA: Diagnosis not present

## 2022-03-30 DIAGNOSIS — N189 Chronic kidney disease, unspecified: Secondary | ICD-10-CM | POA: Diagnosis not present

## 2022-04-01 DIAGNOSIS — E876 Hypokalemia: Secondary | ICD-10-CM | POA: Diagnosis not present

## 2022-04-01 DIAGNOSIS — N2581 Secondary hyperparathyroidism of renal origin: Secondary | ICD-10-CM | POA: Diagnosis not present

## 2022-04-01 DIAGNOSIS — N186 End stage renal disease: Secondary | ICD-10-CM | POA: Diagnosis not present

## 2022-04-01 DIAGNOSIS — D689 Coagulation defect, unspecified: Secondary | ICD-10-CM | POA: Diagnosis not present

## 2022-04-01 DIAGNOSIS — D631 Anemia in chronic kidney disease: Secondary | ICD-10-CM | POA: Diagnosis not present

## 2022-04-01 DIAGNOSIS — Z992 Dependence on renal dialysis: Secondary | ICD-10-CM | POA: Diagnosis not present

## 2022-04-02 ENCOUNTER — Encounter: Payer: PPO | Admitting: Physician Assistant

## 2022-04-02 DIAGNOSIS — E11622 Type 2 diabetes mellitus with other skin ulcer: Secondary | ICD-10-CM | POA: Diagnosis not present

## 2022-04-02 DIAGNOSIS — L97228 Non-pressure chronic ulcer of left calf with other specified severity: Secondary | ICD-10-CM | POA: Diagnosis not present

## 2022-04-02 NOTE — Progress Notes (Addendum)
Mark, [name] ([id number]) Visit Report for 04/02/2022 Arrival Information Details Patient Name: [name], [name]. Date of Service: 04/02/2022 1:15 PM Medical Record Number: [id number] Patient Account Number: [id number] Date of Birth/Sex: [birth year] (81 y.o. M) Treating RN: [name] Primary Care [name]: [name] Other Clinician: Referring [name]: [name] Treating [name]/Extender: [name] in Treatment: 110 Visit Information History Since Last Visit All ordered tests and consults were completed: No Patient Arrived: [name] Added or deleted any medications: No Arrival Time: 13:06 Any new allergies or adverse reactions: No Accompanied By: wife Had a fall or experienced change in No Transfer Assistance: None activities of daily living that may affect Patient Identification Verified: Yes risk of falls: Secondary Verification Process Completed: Yes Signs or symptoms of abuse/neglect since last visito No Patient Requires Transmission-Based Precautions: No Hospitalized since last visit: No Patient Has Alerts: No Implantable device outside of the clinic excluding No cellular tissue based products placed in the center since last visit: Has Dressing in Place as Prescribed: Yes Pain Present Now: No Electronic Signature(s) Signed: 04/04/2022 8:44:03 PM By: Mark Coria RN Entered By: [name] on 04/02/2022 13:11:51 [name], [name] ([id number]) -------------------------------------------------------------------------------- Clinic Level of Care Assessment Details Patient Name: Mark Morale L. Date of Service: 04/02/2022 1:15 PM Medical Record Number: [id number] Patient Account Number: [id number] Date of Birth/Sex: [birth year] (81 y.o. M) Treating RN: [name] Primary Care Kale Dols: [name] Other Clinician: Referring [name]: [name] Treating [name]/Extender: [name] in Treatment: 59 Clinic Level of Care  Assessment Items TOOL 4 Quantity Score X - Use when only an EandM is performed on FOLLOW-UP visit 1 0 ASSESSMENTS - Nursing Assessment / Reassessment X - Reassessment of Co-morbidities (includes updates in patient status) 1 10 X- 1 5 Reassessment of Adherence to Treatment Plan ASSESSMENTS - Wound and Skin Assessment / Reassessment X - Simple Wound Assessment / Reassessment - one wound 1 5 []  - 0 Complex Wound Assessment / Reassessment - multiple wounds []  - 0 Dermatologic / Skin Assessment (not related to wound area) ASSESSMENTS - Focused Assessment []  - Circumferential Edema Measurements - multi extremities 0 []  - 0 Nutritional Assessment / Counseling / Intervention []  - 0 Lower Extremity Assessment (monofilament, tuning fork, pulses) []  - 0 Peripheral Arterial Disease Assessment (using hand held doppler) ASSESSMENTS - Ostomy and/or Continence Assessment and Care []  - Incontinence Assessment and Management 0 []  - 0 Ostomy Care Assessment and Management (repouching, etc.) PROCESS - Coordination of Care X - Simple Patient / Family Education for ongoing care 1 15 []  - 0 Complex (extensive) Patient / Family Education for ongoing care []  - 0 Staff obtains Programmer, systems, Records, Test Results / Process Orders []  - 0 Staff telephones HHA, Nursing Homes / Clarify orders / etc []  - 0 Routine Transfer to another Facility (non-emergent condition) []  - 0 Routine Hospital Admission (non-emergent condition) []  - 0 New Admissions / Biomedical engineer / Ordering NPWT, Apligraf, etc. []  - 0 Emergency Hospital Admission (emergent condition) []  - 0 Simple Discharge Coordination []  - 0 Complex (extensive) Discharge Coordination PROCESS - Special Needs []  - Pediatric / Minor Patient Management 0 []  - 0 Isolation Patient Management []  - 0 Hearing / Language / Visual special needs []  - 0 Assessment of Community assistance (transportation, D/C planning, etc.) []  - 0 Additional  assistance / Altered mentation []  - 0 Support Surface(s) Assessment (bed, cushion, seat, etc.) INTERVENTIONS - Wound Cleansing / Measurement Counce, Aydrien L. ([id number]) X- 1 5 Simple Wound Cleansing -  one wound []  - 0 Complex Wound Cleansing - multiple wounds X- 1 5 Wound Imaging (photographs - any number of wounds) []  - 0 Wound Tracing (instead of photographs) X- 1 5 Simple Wound Measurement - one wound []  - 0 Complex Wound Measurement - multiple wounds INTERVENTIONS - Wound Dressings []  - Small Wound Dressing one or multiple wounds 0 []  - 0 Medium Wound Dressing one or multiple wounds []  - 0 Large Wound Dressing one or multiple wounds []  - 0 Application of Medications - topical []  - 0 Application of Medications - injection INTERVENTIONS - Miscellaneous []  - External ear exam 0 []  - 0 Specimen Collection (cultures, biopsies, blood, body fluids, etc.) []  - 0 Specimen(s) / Culture(s) sent or taken to Lab for analysis []  - 0 Patient Transfer (multiple staff / Civil Service fast streamer / Similar devices) []  - 0 Simple Staple / Suture removal (25 or less) []  - 0 Complex Staple / Suture removal (26 or more) []  - 0 Hypo / Hyperglycemic Management (close monitor of Blood Glucose) []  - 0 Ankle / Brachial Index (ABI) - do not check if billed separately X- 1 5 Vital Signs Has the patient been seen at the hospital within the last three years: Yes Total Score: 55 Level Of Care: New/Established - Level 2 Electronic Signature(s) Signed: 04/04/2022 8:44:03 PM By: Mark Coria RN Entered By: [name] on 04/02/2022 13:26:46 [name], [name] ([id number]) -------------------------------------------------------------------------------- Encounter Discharge Information Details Patient Name: Mark Morale L. Date of Service: 04/02/2022 1:15 PM Medical Record Number: [id number] Patient Account Number: [id number] Date of Birth/Sex: Aug 24, 1940 (81 y.o. M) Treating RN: [name] Primary  Care [name]: [name] Other Clinician: Referring [name]: [name] Treating [name]/Extender: [name] in Treatment: 58 Encounter Discharge Information Items Discharge Condition: Stable Ambulatory Status: Walker Discharge Destination: Home Transportation: Private Auto Accompanied By: wife Schedule Follow-up Appointment: Yes Clinical Summary of Care: Electronic Signature(s) Signed: 04/04/2022 8:44:03 PM By: Mark Coria RN Entered By: [name] on 04/02/2022 13:27:31 [name], [name] ([id number]) -------------------------------------------------------------------------------- Lower Extremity Assessment Details Patient Name: [name], [name] Date of Service: 04/02/2022 1:15 PM Medical Record Number: [id number] Patient Account Number: [id number] Date of Birth/Sex: 08-03-1941 (81 y.o. M) Treating RN: [name] Primary Care Willman Cuny: [name] Other Clinician: Referring Chardae Mulkern: [name] Treating Dalton Molesworth/Extender: [name] in Treatment: 44 Edema Assessment Assessed: [Left: No] [Right: No] Edema: [Left: N] [Right: o] Calf Left: Right: Point of Measurement: 30 cm From Medial Instep 33 cm Ankle Left: Right: Point of Measurement: 10 cm From Medial Instep 19 cm Vascular Assessment Pulses: Dorsalis Pedis Palpable: [Left:Yes] Electronic Signature(s) Signed: 04/04/2022 8:44:03 PM By: Mark Coria RN Entered By: [name] on 04/02/2022 13:16:18 Cookson, [name] ([id number]) -------------------------------------------------------------------------------- Multi Wound Chart Details Patient Name: Mark Morale L. Date of Service: 04/02/2022 1:15 PM Medical Record Number: [id number] Patient Account Number: [id number] Date of Birth/Sex: 1941/02/09 (81 y.o. M) Treating RN: [name] Primary Care Neave Lenger: [name] Other Clinician: Referring Abad Manard: [name] Treating Singleton Hickox/Extender: [name] in  Treatment: 67 Vital Signs Height(in): 68 Pulse(bpm): 48 Weight(lbs): 172 Blood Pressure(mmHg): 139/61 Body Mass Index(BMI): 26.1 Temperature(F): 97.7 Respiratory Rate(breaths/min): 18 Photos: [N/A:N/A] Wound Location: Left, Posterior Lower Leg N/A N/A Wounding Event: Gradually Appeared N/A N/A Primary Etiology: Calciphylaxis N/A N/A Comorbid History: Coronary Artery Disease, Type II N/A N/A Diabetes Date Acquired: 04/05/2021 N/A N/A Weeks of Treatment: 44 N/A N/A Wound Status: Healed - Epithelialized N/A N/A Wound Recurrence: No N/A N/A Clustered Wound: Yes N/A N/A  Clustered Quantity: 3 N/A N/A Measurements L x W x D (cm) 0x0x0 N/A N/A Area (cm) : 0 N/A N/A Volume (cm) : 0 N/A N/A % Reduction in Area: 100.00% N/A N/A % Reduction in Volume: 100.00% N/A N/A Classification: Full Thickness Without Exposed N/A N/A Support Structures Exudate Amount: None Present N/A N/A Granulation Amount: None Present (0%) N/A N/A Necrotic Amount: None Present (0%) N/A N/A Exposed Structures: Fascia: No N/A N/A Fat Layer (Subcutaneous Tissue): No Tendon: No Muscle: No Joint: No Bone: No Epithelialization: Large (67-100%) N/A N/A Treatment Notes Wound #1 (Lower Leg) Wound Laterality: Left, Posterior Cleanser Peri-Wound Care Topical Primary Dressing MILBERT, BIXLER ([id number]) Secondary Dressing Secured With Compression Wrap Compression Stockings Add-Ons Electronic Signature(s) Signed: 04/04/2022 8:44:03 PM By: Mark Coria RN Entered By: [name] on 04/02/2022 13:25:55 Spires, Granite L. ([id number]) -------------------------------------------------------------------------------- [place] Details Patient Name: Mark Morale L. Date of Service: 04/02/2022 1:15 PM Medical Record Number: [id number] Patient Account Number: [id number] Date of Birth/Sex: 1941-07-17 (81 y.o. M) Treating RN: [name] Primary Care Kjell Brannen: [name] Other  Clinician: Referring Winnell Bento: [name] Treating Capri Raben/Extender: [name] in Treatment: 2 Active Inactive Electronic Signature(s) Signed: 04/04/2022 8:44:03 PM By: Mark Coria RN Entered By: [name] on 04/02/2022 13:25:46 [name], [name] ([id number]) -------------------------------------------------------------------------------- Pain Assessment Details Patient Name: [name], [name] Date of Service: 04/02/2022 1:15 PM Medical Record Number: [id number] Patient Account Number: [id number] Date of Birth/Sex: January 25, 1941 (81 y.o. M) Treating RN: [name] Primary Care Syrenity Klepacki: [name] Other Clinician: Referring Anwita Mencer: [name] Treating Shenekia Riess/Extender: [name] in Treatment: 65 Active Problems Location of Pain Severity and Description of Pain Patient Has Paino No Site Locations Pain Management and Medication Current Pain Management: Electronic Signature(s) Signed: 04/04/2022 8:44:03 PM By: Mark Coria RN Entered By: [name] on 04/02/2022 13:12:26 [name], [name] ([id number]) -------------------------------------------------------------------------------- Patient/Caregiver Education Details Patient Name: Mark Morale L. Date of Service: 04/02/2022 1:15 PM Medical Record Number: [id number] Patient Account Number: [id number] Date of Birth/Gender: Jun 04, 1941 (81 y.o. M) Treating RN: [name] Primary Care Physician: [name] Other Clinician: Referring Physician: Jenna [name] Treating Physician/Extender: [name] in Treatment: 44 Education Assessment Education Provided To: Patient Education Topics Provided Wound/Skin Impairment: Methods: Explain/Verbal Responses: State content correctly Electronic Signature(s) Signed: 04/04/2022 8:44:03 PM By: Mark Coria RN Entered By: [name] on 04/02/2022 13:27:00 [name], [name]  ([id number]) -------------------------------------------------------------------------------- Wound Assessment Details Patient Name: [name], [name] Date of Service: 04/02/2022 1:15 PM Medical Record Number: [id number] Patient Account Number: [id number] Date of Birth/Sex: 12-26-1940 (81 y.o. M) Treating RN: [name] Primary Care Amica Harron: [name] Other Clinician: Referring Lititia Sen: [name] Treating Annajulia Lewing/Extender: [name] in Treatment: 44 Wound Status Wound Number: 1 Primary Etiology: Calciphylaxis Wound Location: Left, Posterior Lower Leg Wound Status: Healed - Epithelialized Wounding Event: Gradually Appeared Comorbid History: Coronary Artery Disease, Type II Diabetes Date Acquired: 04/05/2021 Weeks Of Treatment: 44 Clustered Wound: Yes Photos Wound Measurements Length: (cm) Width: (cm) Depth: (cm) Clustered Quantity: Area: (cm) Volume: (cm) 0 % Reduction in Area: 100% 0 % Reduction in Volume: 100% 0 Epithelialization: Large (67-100%) 3 Tunneling: No 0 Undermining: No 0 Wound Description Classification: Full Thickness Without Exposed Support Structures Exudate Amount: None Present Foul Odor After Cleansing: No Slough/Fibrino No Wound Bed Granulation Amount: None Present (0%) Exposed Structure Necrotic Amount: None Present (0%) Fascia Exposed: No Fat Layer (Subcutaneous Tissue) Exposed: No Tendon Exposed: No Muscle Exposed: No Joint Exposed: No Bone Exposed: No Treatment Notes  Wound #1 (Lower Leg) Wound Laterality: Left, Posterior Cleanser Peri-Wound Care Topical Primary Dressing [name], BRAHMBHATT ([id number]) Secondary Dressing Secured With Compression Wrap Compression Stockings Add-Ons Electronic Signature(s) Signed: 04/04/2022 8:44:03 PM By: Mark Coria RN Entered By: [name] on 04/02/2022 13:25:09 Pigford, Shuaib L.  ([id number]) -------------------------------------------------------------------------------- Vitals Details Patient Name: Mark Morale L. Date of Service: 04/02/2022 1:15 PM Medical Record Number: [id number] Patient Account Number: [id number] Date of Birth/Sex: 1940-12-08 (81 y.o. M) Treating RN: [name] Primary Care Pope Brunty: [name] Other Clinician: Referring Satchel Heidinger: [name] Treating Aloysuis Ribaudo/Extender: [name] in Treatment: 47 Vital Signs Time Taken: 13:11 Temperature (F): 97.7 Height (in): 68 Pulse (bpm): 67 Weight (lbs): 172 Respiratory Rate (breaths/min): 18 Body Mass Index (BMI): 26.1 Blood Pressure (mmHg): 139/61 Reference Range: 80 - 120 mg / dl Electronic Signature(s) Signed: 04/04/2022 8:44:03 PM By: Mark Coria RN Entered By: [name] on 04/02/2022 13:12:17

## 2022-04-02 NOTE — Progress Notes (Addendum)
HAYDAN, MANSOURI ([id number]) Visit Report for 04/02/2022 Chief Complaint Document Details Patient Name: [name], [name]. Date of Service: 04/02/2022 1:15 PM Medical Record Number: [id number] Patient Account Number: [id number] Date of Birth/Sex: September 19, 1940 (81 y.o. 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 ([id number]) -------------------------------------------------------------------------------- HPI Details Patient Name: [name] L. Date of Service: 04/02/2022 1:15 PM Medical Record Number: [id number] Patient Account Number: [id number] Date of Birth/Sex: Oct 09, 1940 (81 y.o. 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 This has been present for a couple of months currently he is on the sodium thiosulfate at dialysis. He tells me that they picked up on this quickly and have been managing it but he really has not had any help with anything else from the standpoint of progressing the wound itself. The patient does have again end-stage renal disease which currently is listed as stage IV but he is on renal dialysis. He also has coronary artery disease as well as going shortly for formal arterial studies although he did have a quick  test of the tibial artery with his vascular doctor when he went in to check on his fistula for his arm and subsequently they did not feel like he had any hemodynamically unstable flow into the lower extremity this is good news. Nonetheless I think that the biggest issue here is going to be getting some of the eschar off so that we get the wounds med headed in the appropriate direction. 05/31/2021 upon evaluation today patient appears to be doing well with regard to his leg ulceration with calciphylaxis. I am very pleased in this regard. Fortunately there does not appear to be any signs of active infection at this time which is great news. No fevers, chills, nausea, vomiting, or diarrhea. 06/07/2021 upon evaluation today patient appears to be doing well with regard to his legs. Unfortunately he is having an issue here with his penis as well he did mention at the end of last visit. I suggested that he go see urology. However when he went to see the urologist they basically told him there was not anything they could do. They recommended that we needed to be the ones to take care of this. With that being said this is an unusual area that to be honest a lot of the traditional wound care products we use are not really good to be good for this region. Potentially Medihoney alginate could be a possibility also think Santyl could be a possibility. With that being said I think initially my suggestion is probably can be for [place] to attempt the Santyl to see if that will benefit the patient. 06/21/2021 upon evaluation today patient's wound bed actually showed signs of good granulation and epithelization at this point. Fortunately I do not see any signs of active infection systemically which is great news and his legs are doing awesome I think you are making great progress. The Santyl on  the glans penis also has been of benefit this is looking tremendously better. Again the patient is pleased he also tells me the  pain is significantly improved. Overall I think we are headed in the appropriate direction here. 07/05/2021 upon evaluation today patient appears to be doing well with regard to his wounds. Both the area on the tip of his penis as well as the left lateral leg is doing well. There is some debridement this can be needed over the leg region. Fortunately I think that overall the Dakin's moistened gauze has done a great job here. No fevers, chills, nausea, vomiting, or diarrhea. 07/12/2021 upon evaluation today patient presents for reevaluation here in the clinic and overall I think that his wounds are doing significantly better which is great news. This includes both the left leg as well as the glans of the penis. Both are showing signs of significant improvement which is great and overall I think that we are headed in the right direction. I do not see any evidence of active infection at this moment. 07/19/2021 upon evaluation today patient appears to be doing better in regard to his wounds. Were getting much closer to complete resolution in regard to the necrotic tissue being removed from the leg. Overall I am extremely pleased with where we stand today. Overall I do not see any signs of active infection at this time. With regard to the patient's penis this area is showing signs of improvement as well and I am very pleased with that. 07/26/2021 upon evaluation today patient appears to be doing well with regard to his wounds. The leg in fact is looking quite well and I am very pleased with where we stand today. I do not see any signs of infection which is great and there is a lot of new granulation growth also also. With regard to the penis area this also showed signs of excellent improvement which is great news. 08/14/2020 upon evaluation today patient appears to be doing well with regard to his wounds both the leg as well as the glans penis region. Both are showing signs of improvement which is great news  and overall I am extremely pleased with where we stand at this point. I think that the Dakin's moistened gauze dressing for her leg is doing great and the Santyl is definitely helping with the glans penis location. 08/28/2021 upon evaluation today patient appears to be doing well with regard to his wounds on the left leg as well as the penis. Fortunately both are showing signs of improvement. I am going to perform some debridement in regard to the leg there is a small area which is having trouble getting completely clean my work on that a little bit more today. 09/04/2021 upon evaluation today patient appears to be doing pretty well in regard to his wounds. 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 Again he has I just found out today been off of the sodium thiosulfate for about 2 to 3 weeks. He tells me that the initial "3 weeks" was stated to be up by the nurse. With that being said I think he probably is going to need it for a bit longer to be honest. We discussed that today. Over the penis area the Santyl does seem to be loose and a lot of this up and that is good hopefully will be able  to get this completely cleared off it sometime shortly. 09/13/2021 upon evaluation today patient appears to be doing well with regard to his wound. He has been tolerating the dressing changes without complication. Fortunately there does not appear to be any signs of active infection locally or systemically which is great news. Overall I think that he is making progress with regard to his wounds in general. 09/25/2021 upon evaluation today patient appears to be doing excellent in regard to his wounds. I am actually extremely pleased with where we stand today and I think he is making great progress. The patient likewise is happy to hear this. 10/09/2021 upon evaluation today patient actually appears to be doing excellent in  regard to his wound. He has been tolerating the dressing changes without complication. Fortunately I do not see any evidence of active infection locally or systemically which is great news. No fevers, Prows, Meng L. ([id number]) chills, nausea, vomiting, or diarrhea. 3/14; patient presents for follow-up. He has no issues or complaints today. 10/23/2021 upon evaluation today patient appears to be doing well with regard to his wounds. In fact the penis appears to be pretty much healed there may be a point when opening but not much at all to be perfectly honest. In regard to the leg I Flygt were definitely seeing signs of significant improvement which is great news. 10/30/2021 upon evaluation today patient appears to be doing well with regard to his wounds. In fact the penis area is completely healed which is great news that just the leg that were dealing with and this is significantly smaller. I am actually extremely pleased with where we stand. 11-06-2021 upon evaluation today patient appears to be doing well with regard to his leg ulcers. All are measuring smaller and looking great. I do not see any signs of infection which is great news. No fever chills noted 11-13-2021 upon evaluation today patient appears to be doing well with regard to his wounds everything is still measuring smaller and looking excellent. I am extremely pleased with where we stand today. There does not appear to be any signs of active infection at this time. No fevers, chills, nausea, vomiting, or diarrhea. 11-20-2021 upon evaluation today patient actually appears to be doing quite well in regard to his wounds. Has been tolerating the dressing changes without complication. Fortunately I do not see any evidence of active infection locally or systemically at this time which is great news. No fevers, chills, nausea, vomiting, or diarrhea. I think were definitely headed in the right direction here. 12-04-2021 upon evaluation today  patient appears to be doing well with regard to his wounds. Overall things seem to be measuring smaller and doing much better. Fortunately there does not appear to be any evidence of active infection locally nor systemically which is great news. No fevers, chills, nausea, vomiting, or diarrhea. 12/11/2021 upon evaluation today patient actually appears to be doing great in regard to his wounds he has been tolerating the dressing changes without complication. Fortunately there does not appear to be any evidence of active infection locally or systemically which is great news. No fevers, chills, nausea, vomiting, or diarrhea. 12-25-2021 upon evaluation today patient appears to be doing excellent in regard to his leg ulcers he has been tolerating the dressing changes and fortunately I do not see any signs of active infection locally or systemically at this time which is great news. Overall I do believe that we are headed in the appropriate direction here. I do not  see any evidence that this is worsening in general which is great news. 01-08-2022 upon evaluation today patient's leg is almost completely closed on the lateral aspect of the posterior is doing awesome. I do not see any evidence of infection and overall I think he is really making excellent progress here. Overall I think we are on the right track. 01-24-2022 upon evaluation today patient's wound is actually showing signs of excellent improvement he did have 1 small area that reopened it looks as if he may have bumped this on something I am not 100% sure. With that being said he overall seems to be doing quite well and I am very pleased with where things stand currently I see no signs of active infection at this time which is great news. 02-07-2022 upon evaluation today patient is making excellent progress his wounds are measuring smaller looking much better and overall I am extremely pleased with where things stand currently. I do not see any evidence  of active infection locally or systemically which is great news. Upon inspection patient's wound bed actually showed signs of good granulation and epithelization at this point. Fortunately I think that he is doing awesome with regard to his healing and I am very pleased in that regard. I think that we are on the right track. 02-19-2022 upon evaluation today patient appears to be doing well currently in regard to her wound. She has been tolerating the dressing changes without complication. Fortunately there does not appear to be any evidence of active infection locally or systemically which is great news. No fevers, chills, nausea, vomiting, or diarrhea. 03-05-2022 patient appears to be doing very well in regard to his leg ulcer. The area that is remaining is currently extremely small and seems to be making great progress. I do not see any evidence of active infection locally or systemically at this time which is great news. No fevers, chills, nausea, vomiting, or diarrhea. 03-19-2022 upon evaluation today patient appears to be doing excellent in regard to his wound. In fact this is very close to getting completely closed. Fortunately I do not see any evidence of active infection locally or systemically at this time which is great news. No fevers, chills, nausea, vomiting, or diarrhea. 04-02-2022 upon evaluation today patient appears to be doing excellent in regard to his legs in fact he appears to be completely healed. I do not see any evidence of active infection locally or systemically at this time which is great news. Electronic Signature(s) Signed: 04/02/2022 1:42:29 PM By: Worthy Keeler PA-C Entered By: Worthy Keeler on 04/02/2022 13:42:29 Romulus, [name] ([id number]) -------------------------------------------------------------------------------- Physical Exam Details Patient Name: Roser, Nathin L. Date of Service: 04/02/2022 1:15 PM Medical Record Number: [id number] Patient Account  Number: [id number] Date of Birth/Sex: [birth year] (81 y.o. M) Treating RN: Carlene Coria Primary Care Provider: Jenna Luo Other Clinician: Referring Provider: Jenna Luo Treating Provider/Extender: Skipper Cliche in Treatment: 61 Constitutional Well-nourished and well-hydrated in no acute distress. Respiratory normal breathing without difficulty. Psychiatric this patient is able to make decisions and demonstrates good insight into disease process. Alert and Oriented x 3. pleasant and cooperative. Notes Upon inspection patient's wound bed actually showed signs of excellent granulation and epithelization at this point and overall I am extremely pleased with where we stand today. Electronic Signature(s) Signed: 04/02/2022 1:42:46 PM By: Worthy Keeler PA-C Entered By: Worthy Keeler on 04/02/2022 13:42:46 Yan, [name] ([id number]) -------------------------------------------------------------------------------- Physician Orders Details Patient Name: [name] L. Date  of Service: 04/02/2022 1:15 PM Medical Record Number: [id number] Patient Account Number: [id number] Date of Birth/Sex: 1940/09/21 (81 y.o. M) Treating RN: Carlene Coria Primary Care Provider: Jenna Luo Other Clinician: Referring Provider: Jenna Luo Treating Provider/Extender: Skipper Cliche in Treatment: 44 Verbal / Phone Orders: No Diagnosis Coding ICD-10 Coding Code Description E83.50 Unspecified disorder of calcium metabolism L97.822 Non-pressure chronic ulcer of other part of left lower leg with fat layer exposed L98.492 Non-pressure chronic ulcer of skin of other sites with fat layer exposed Z99.2 Dependence on renal dialysis N18.4 Chronic kidney disease, stage 4 (severe) I25.10 Atherosclerotic heart disease of native coronary artery without angina pectoris Discharge From [facility] o Discharge from [facility] o Moisturize legs daily after  removing compression garments. Electronic Signature(s) Signed: 04/02/2022 4:37:11 PM By: Worthy Keeler PA-C Signed: 04/04/2022 8:44:03 PM By: Carlene Coria RN Entered By: Carlene Coria on 04/02/2022 13:26:17 Feagans, Kayson LMarland Kitchen ([id number]) -------------------------------------------------------------------------------- Problem List Details Patient Name: Swaim, Oren L. Date of Service: 04/02/2022 1:15 PM Medical Record Number: [id number] Patient Account Number: [id number] Date of Birth/Sex: [birth year] (81 y.o. M) Treating RN: Carlene Coria Primary Care Provider: Jenna Luo Other Clinician: Referring Provider: Jenna Luo Treating Provider/Extender: Skipper Cliche in Treatment: 17 Active Problems ICD-10 Encounter Code Description Active Date MDM Diagnosis E83.50 Unspecified disorder of calcium metabolism 05/24/2021 No Yes L97.822 Non-pressure chronic ulcer of other part of left lower leg with fat layer 05/24/2021 No Yes exposed L98.492 Non-pressure chronic ulcer of skin of other sites with fat layer exposed 06/07/2021 No Yes Z99.2 Dependence on renal dialysis 05/24/2021 No Yes N18.4 Chronic kidney disease, stage 4 (severe) 05/24/2021 No Yes I25.10 Atherosclerotic heart disease of native coronary artery without angina 05/24/2021 No Yes pectoris Inactive Problems Resolved Problems Electronic Signature(s) Signed: 04/02/2022 1:02:58 PM By: Worthy Keeler PA-C Entered By: Worthy Keeler on 04/02/2022 13:02:58 Markman, Jahad L. ([id number]) -------------------------------------------------------------------------------- Progress Note Details Patient Name: Emmons, Jordon L. Date of Service: 04/02/2022 1:15 PM Medical Record Number: [id number] Patient Account Number: [id number] Date of Birth/Sex: 11-01-1940 (81 y.o. 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 This has been present for a couple of months currently he is on the sodium thiosulfate at dialysis. He tells me that they picked up on this quickly and have been managing it but he really has not had any help with anything else from the standpoint of progressing the wound itself. The patient does have again end-stage renal disease which currently is listed as stage IV but he is on renal dialysis. He also has coronary artery disease as well as going shortly for formal arterial studies although he did have a quick test of the tibial artery with his vascular doctor when he went in to check on his fistula for his arm and subsequently they did not feel like he had any hemodynamically unstable flow into the lower extremity this is good news. Nonetheless I think that the biggest issue here is going to be getting some of the eschar off so that we get the wounds med headed in the appropriate direction. 05/31/2021 upon evaluation today patient appears  to be doing well with regard to his leg ulceration with calciphylaxis. I am very pleased in this regard. Fortunately there does not appear to be any signs of active infection at this time which is great news. No fevers, chills, nausea, vomiting, or diarrhea. 06/07/2021 upon evaluation today patient appears to be doing well with regard to his legs. Unfortunately he is having an issue here with his penis as well he did mention at the end of last visit. I suggested that he go see urology. However when he went to see the urologist they basically told him there was not anything they could do. They recommended  that we needed to be the ones to take care of this. With that being said this is an unusual area that to be honest a lot of the traditional wound care products we use are not really good to be good for this region. Potentially Medihoney alginate could be a possibility also think Santyl could be a possibility. With that being said I think initially my suggestion is probably can be for [place] to attempt the Santyl to see if that will benefit the patient. 06/21/2021 upon evaluation today patient's wound bed actually showed signs of good granulation and epithelization at this point. Fortunately I do not see any signs of active infection systemically which is great news and his legs are doing awesome I think you are making great progress. The Santyl on the glans penis also has been of benefit this is looking tremendously better. Again the patient is pleased he also tells me the pain is significantly improved. Overall I think we are headed in the appropriate direction here. 07/05/2021 upon evaluation today patient appears to be doing well with regard to his wounds. Both the area on the tip of his penis as well as the left lateral leg is doing well. There is some debridement this can be needed over the leg region. Fortunately I think that overall the Dakin's moistened gauze has done a great job here. No fevers, chills, nausea, vomiting, or diarrhea. 07/12/2021 upon evaluation today patient presents for reevaluation here in the clinic and overall I think that his wounds are doing significantly better which is great news. This includes both the left leg as well as the glans of the penis. Both are showing signs of significant improvement which is great and overall I think that we are headed in the right direction. I do not see any evidence of active infection at this moment. 07/19/2021 upon evaluation today patient appears to be doing better in regard to his wounds. Were getting much closer to complete resolution  in regard to the necrotic tissue being removed from the leg. Overall I am extremely pleased with where we stand today. Overall I do not see any signs of active infection at this time. With regard to the patient's penis this area is showing signs of improvement as well and I am very pleased with that. 07/26/2021 upon evaluation today patient appears to be doing well with regard to his wounds. The leg in fact is looking quite well and I am very pleased with where we stand today. I do not see any signs of infection which is great and there is a lot of new granulation growth also also. With regard to the penis area this also showed signs of excellent improvement which is great news. 08/14/2020 upon evaluation today patient appears to be doing well with regard to his wounds both the leg as well as the glans  penis region. Both are showing signs of improvement which is great news and overall I am extremely pleased with where we stand at this point. I think that the Dakin's moistened gauze dressing for her leg is doing great and the Santyl is definitely helping with the glans penis location. 08/28/2021 upon evaluation today patient appears to be doing well with regard to his wounds on the left leg as well as the penis. Fortunately both are showing signs of improvement. I am going to perform some debridement in regard to the leg there is a small area which is having trouble getting completely clean my work on that a little bit more today. 09/04/2021 upon evaluation today patient appears to be doing pretty well in regard to his wounds. 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 Again he has I just found out today been off of the sodium thiosulfate for about 2 to 3 weeks. He tells me that the initial "3 weeks" was stated to be up by the nurse. With that being said I think he probably is going to need it for a bit  longer to be honest. We discussed that today. Over the penis area the Santyl does seem to be loose and a lot of this up and that is good hopefully will be able to get this completely cleared off it sometime shortly. 09/13/2021 upon evaluation today patient appears to be doing well with regard to his wound. He has been tolerating the dressing changes without complication. Fortunately there does not appear to be any signs of active infection locally or systemically which is great news. Overall I think that he is making progress with regard to his wounds in general. Tadesse, Kyston L. ([id number]) 09/25/2021 upon evaluation today patient appears to be doing excellent in regard to his wounds. I am actually extremely pleased with where we stand today and I think he is making great progress. The patient likewise is happy to hear this. 10/09/2021 upon evaluation today patient actually appears to be doing excellent in regard to his wound. He has been tolerating the dressing changes without complication. Fortunately I do not see any evidence of active infection locally or systemically which is great news. No fevers, chills, nausea, vomiting, or diarrhea. 3/14; patient presents for follow-up. He has no issues or complaints today. 10/23/2021 upon evaluation today patient appears to be doing well with regard to his wounds. In fact the penis appears to be pretty much healed there may be a point when opening but not much at all to be perfectly honest. In regard to the leg I Flygt were definitely seeing signs of significant improvement which is great news. 10/30/2021 upon evaluation today patient appears to be doing well with regard to his wounds. In fact the penis area is completely healed which is great news that just the leg that were dealing with and this is significantly smaller. I am actually extremely pleased with where we stand. 11-06-2021 upon evaluation today patient appears to be doing well with regard to his leg  ulcers. All are measuring smaller and looking great. I do not see any signs of infection which is great news. No fever chills noted 11-13-2021 upon evaluation today patient appears to be doing well with regard to his wounds everything is still measuring smaller and looking excellent. I am extremely pleased with where we stand today. There does  not appear to be any signs of active infection at this time. No fevers, chills, nausea, vomiting, or diarrhea. 11-20-2021 upon evaluation today patient actually appears to be doing quite well in regard to his wounds. Has been tolerating the dressing changes without complication. Fortunately I do not see any evidence of active infection locally or systemically at this time which is great news. No fevers, chills, nausea, vomiting, or diarrhea. I think were definitely headed in the right direction here. 12-04-2021 upon evaluation today patient appears to be doing well with regard to his wounds. Overall things seem to be measuring smaller and doing much better. Fortunately there does not appear to be any evidence of active infection locally nor systemically which is great news. No fevers, chills, nausea, vomiting, or diarrhea. 12/11/2021 upon evaluation today patient actually appears to be doing great in regard to his wounds he has been tolerating the dressing changes without complication. Fortunately there does not appear to be any evidence of active infection locally or systemically which is great news. No fevers, chills, nausea, vomiting, or diarrhea. 12-25-2021 upon evaluation today patient appears to be doing excellent in regard to his leg ulcers he has been tolerating the dressing changes and fortunately I do not see any signs of active infection locally or systemically at this time which is great news. Overall I do believe that we are headed in the appropriate direction here. I do not see any evidence that this is worsening in general which is great news. 01-08-2022  upon evaluation today patient's leg is almost completely closed on the lateral aspect of the posterior is doing awesome. I do not see any evidence of infection and overall I think he is really making excellent progress here. Overall I think we are on the right track. 01-24-2022 upon evaluation today patient's wound is actually showing signs of excellent improvement he did have 1 small area that reopened it looks as if he may have bumped this on something I am not 100% sure. With that being said he overall seems to be doing quite well and I am very pleased with where things stand currently I see no signs of active infection at this time which is great news. 02-07-2022 upon evaluation today patient is making excellent progress his wounds are measuring smaller looking much better and overall I am extremely pleased with where things stand currently. I do not see any evidence of active infection locally or systemically which is great news. Upon inspection patient's wound bed actually showed signs of good granulation and epithelization at this point. Fortunately I think that he is doing awesome with regard to his healing and I am very pleased in that regard. I think that we are on the right track. 02-19-2022 upon evaluation today patient appears to be doing well currently in regard to her wound. She has been tolerating the dressing changes without complication. Fortunately there does not appear to be any evidence of active infection locally or systemically which is great news. No fevers, chills, nausea, vomiting, or diarrhea. 03-05-2022 patient appears to be doing very well in regard to his leg ulcer. The area that is remaining is currently extremely small and seems to be making great progress. I do not see any evidence of active infection locally or systemically at this time which is great news. No fevers, chills, nausea, vomiting, or diarrhea. 03-19-2022 upon evaluation today patient appears to be doing  excellent in regard to his wound. In fact this is very close to getting  completely closed. Fortunately I do not see any evidence of active infection locally or systemically at this time which is great news. No fevers, chills, nausea, vomiting, or diarrhea. 04-02-2022 upon evaluation today patient appears to be doing excellent in regard to his legs in fact he appears to be completely healed. I do not see any evidence of active infection locally or systemically at this time which is great news. Objective Constitutional Plair, Gildardo L. ([id number]) Well-nourished and well-hydrated in no acute distress. Vitals Time Taken: 1:11 PM, Height: 68 in, Weight: 172 lbs, BMI: 26.1, Temperature: 97.7 F, Pulse: 67 bpm, Respiratory Rate: 18 breaths/min, Blood Pressure: 139/61 mmHg. Respiratory normal breathing without difficulty. Psychiatric this patient is able to make decisions and demonstrates good insight into disease process. Alert and Oriented x 3. pleasant and cooperative. General Notes: Upon inspection patient's wound bed actually showed signs of excellent granulation and epithelization at this point and overall I am extremely pleased with where we stand today. Integumentary (Hair, Skin) Wound #1 status is Healed - Epithelialized. Original cause of wound was Gradually Appeared. The date acquired was: 04/05/2021. The wound has been in treatment 44 weeks. The wound is located on the Left,Posterior Lower Leg. The wound measures 0cm length x 0cm width x 0cm depth; 0cm^2 area and 0cm^3 volume. There is no tunneling or undermining noted. There is a none present amount of drainage noted. There is no granulation within the wound bed. There is no necrotic tissue within the wound bed. 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 1. Based on what I am seeing the patient is completely healed which is great news. I would recommend that we discontinue wound care services currently. 2. I am good recommend the patient should be very cautious and protective with regard to his legs needs to make sure that he does not bump this on anything as it still very fragile. He also should not scrub too hard in the shower just washing very lightly. 3. He should also be wearing compression garments but he needs to do this carefully taking them off and on. We will see him back for follow-up visit as needed. Electronic Signature(s) Signed: 04/02/2022 1:43:27 PM By: Worthy Keeler PA-C Entered By: Worthy Keeler on 04/02/2022 13:43:26 Jasmer, Jaryan L. ([id number]) -------------------------------------------------------------------------------- SuperBill Details Patient Name: [name] L. Date of Service: 04/02/2022 Medical Record Number: [id number] Patient Account Number: [id number] Date of Birth/Sex: May 23, 1941 (81 y.o. M) Treating RN: Carlene Coria Primary Care Provider: Jenna Luo Other Clinician: Referring Provider: Jenna Luo Treating Provider/Extender: Skipper Cliche in Treatment: 44 Diagnosis Coding ICD-10 Codes Code Description E83.50 Unspecified disorder of calcium metabolism L97.822 Non-pressure chronic ulcer of other part of left lower leg with fat layer exposed L98.492 Non-pressure chronic ulcer of skin of other sites with fat layer exposed Z99.2 Dependence on renal dialysis N18.4 Chronic kidney disease, stage 4 (severe) I25.10 Atherosclerotic heart disease of native coronary artery without angina  pectoris Facility Procedures CPT4 Code: 29924268 Description: 34196 - WOUND CARE VISIT-LEV 2 EST PT Modifier: Quantity: 1 Physician Procedures CPT4 Code: 2229798 Description: 99213 - WC PHYS  LEVEL 3 - EST PT Modifier: Quantity: 1 CPT4 Code: Description: ICD-10 Diagnosis Description E83.50 Unspecified disorder of calcium metabolism L97.822 Non-pressure chronic ulcer of other part of left lower leg with fat lay L98.492 Non-pressure chronic ulcer of skin of other sites with fat layer expose  Z99.2 Dependence on renal dialysis Modifier: er exposed d Quantity: Electronic Signature(s) Signed: 04/02/2022 1:43:49 PM By: Worthy Keeler PA-C Entered By: Worthy Keeler on 04/02/2022 13:43:48

## 2022-04-04 DIAGNOSIS — Z992 Dependence on renal dialysis: Secondary | ICD-10-CM | POA: Diagnosis not present

## 2022-04-04 DIAGNOSIS — N186 End stage renal disease: Secondary | ICD-10-CM | POA: Diagnosis not present

## 2022-04-04 DIAGNOSIS — E1122 Type 2 diabetes mellitus with diabetic chronic kidney disease: Secondary | ICD-10-CM | POA: Diagnosis not present

## 2022-04-05 DIAGNOSIS — N2581 Secondary hyperparathyroidism of renal origin: Secondary | ICD-10-CM | POA: Diagnosis not present

## 2022-04-05 DIAGNOSIS — Z992 Dependence on renal dialysis: Secondary | ICD-10-CM | POA: Diagnosis not present

## 2022-04-05 DIAGNOSIS — N186 End stage renal disease: Secondary | ICD-10-CM | POA: Diagnosis not present

## 2022-04-05 DIAGNOSIS — D689 Coagulation defect, unspecified: Secondary | ICD-10-CM | POA: Diagnosis not present

## 2022-04-08 DIAGNOSIS — N186 End stage renal disease: Secondary | ICD-10-CM | POA: Diagnosis not present

## 2022-04-08 DIAGNOSIS — N2581 Secondary hyperparathyroidism of renal origin: Secondary | ICD-10-CM | POA: Diagnosis not present

## 2022-04-08 DIAGNOSIS — E876 Hypokalemia: Secondary | ICD-10-CM | POA: Diagnosis not present

## 2022-04-08 DIAGNOSIS — Z992 Dependence on renal dialysis: Secondary | ICD-10-CM | POA: Diagnosis not present

## 2022-04-08 DIAGNOSIS — D689 Coagulation defect, unspecified: Secondary | ICD-10-CM | POA: Diagnosis not present

## 2022-04-09 ENCOUNTER — Other Ambulatory Visit: Payer: Self-pay | Admitting: Family Medicine

## 2022-04-15 DIAGNOSIS — N186 End stage renal disease: Secondary | ICD-10-CM | POA: Diagnosis not present

## 2022-04-15 DIAGNOSIS — Z992 Dependence on renal dialysis: Secondary | ICD-10-CM | POA: Diagnosis not present

## 2022-04-15 DIAGNOSIS — D689 Coagulation defect, unspecified: Secondary | ICD-10-CM | POA: Diagnosis not present

## 2022-04-15 DIAGNOSIS — E876 Hypokalemia: Secondary | ICD-10-CM | POA: Diagnosis not present

## 2022-04-15 DIAGNOSIS — N2581 Secondary hyperparathyroidism of renal origin: Secondary | ICD-10-CM | POA: Diagnosis not present

## 2022-04-16 ENCOUNTER — Ambulatory Visit: Payer: PPO | Admitting: Physician Assistant

## 2022-04-22 ENCOUNTER — Encounter: Payer: Self-pay | Admitting: Family Medicine

## 2022-04-22 DIAGNOSIS — N2581 Secondary hyperparathyroidism of renal origin: Secondary | ICD-10-CM | POA: Diagnosis not present

## 2022-04-22 DIAGNOSIS — Z992 Dependence on renal dialysis: Secondary | ICD-10-CM | POA: Diagnosis not present

## 2022-04-22 DIAGNOSIS — D689 Coagulation defect, unspecified: Secondary | ICD-10-CM | POA: Diagnosis not present

## 2022-04-22 DIAGNOSIS — N186 End stage renal disease: Secondary | ICD-10-CM | POA: Diagnosis not present

## 2022-04-22 DIAGNOSIS — E1122 Type 2 diabetes mellitus with diabetic chronic kidney disease: Secondary | ICD-10-CM | POA: Diagnosis not present

## 2022-04-23 ENCOUNTER — Other Ambulatory Visit: Payer: Self-pay | Admitting: Family Medicine

## 2022-04-23 ENCOUNTER — Encounter: Payer: Self-pay | Admitting: Family Medicine

## 2022-04-23 MED ORDER — LIDOCAINE-PRILOCAINE 2.5-2.5 % EX CREA: TOPICAL_CREAM | CUTANEOUS | 11 refills | Status: DC

## 2022-04-25 ENCOUNTER — Ambulatory Visit: Payer: PPO | Admitting: Family Medicine

## 2022-04-25 ENCOUNTER — Telehealth: Payer: Self-pay

## 2022-04-25 NOTE — Telephone Encounter (Signed)
While calling pt to reschedule pt stated that he would need a PA for a med that he submitted for a refill on 9/20. Please advise.  Cb#: [phone]

## 2022-04-26 ENCOUNTER — Telehealth: Payer: Self-pay

## 2022-04-26 NOTE — Telephone Encounter (Signed)
PA-Lidocaine sent to plan

## 2022-04-29 DIAGNOSIS — Z992 Dependence on renal dialysis: Secondary | ICD-10-CM | POA: Diagnosis not present

## 2022-04-29 DIAGNOSIS — E876 Hypokalemia: Secondary | ICD-10-CM | POA: Diagnosis not present

## 2022-04-29 DIAGNOSIS — N186 End stage renal disease: Secondary | ICD-10-CM | POA: Diagnosis not present

## 2022-04-29 DIAGNOSIS — D631 Anemia in chronic kidney disease: Secondary | ICD-10-CM | POA: Diagnosis not present

## 2022-04-29 DIAGNOSIS — D689 Coagulation defect, unspecified: Secondary | ICD-10-CM | POA: Diagnosis not present

## 2022-04-29 DIAGNOSIS — N2581 Secondary hyperparathyroidism of renal origin: Secondary | ICD-10-CM | POA: Diagnosis not present

## 2022-04-30 ENCOUNTER — Encounter: Payer: Self-pay | Admitting: Student in an Organized Health Care Education/Training Program

## 2022-04-30 ENCOUNTER — Telehealth: Payer: Self-pay

## 2022-04-30 ENCOUNTER — Ambulatory Visit
Payer: PPO | Attending: Student in an Organized Health Care Education/Training Program | Admitting: Student in an Organized Health Care Education/Training Program

## 2022-04-30 VITALS — BP 146/77 | HR 62 | Temp 97.2°F | Resp 17 | Ht 68.0 in | Wt 164.0 lb

## 2022-04-30 DIAGNOSIS — A419 Sepsis, unspecified organism: Secondary | ICD-10-CM | POA: Diagnosis not present

## 2022-04-30 DIAGNOSIS — Z794 Long term (current) use of insulin: Secondary | ICD-10-CM | POA: Insufficient documentation

## 2022-04-30 DIAGNOSIS — L97923 Non-pressure chronic ulcer of unspecified part of left lower leg with necrosis of muscle: Secondary | ICD-10-CM | POA: Insufficient documentation

## 2022-04-30 DIAGNOSIS — N189 Chronic kidney disease, unspecified: Secondary | ICD-10-CM | POA: Diagnosis not present

## 2022-04-30 DIAGNOSIS — E1142 Type 2 diabetes mellitus with diabetic polyneuropathy: Secondary | ICD-10-CM | POA: Insufficient documentation

## 2022-04-30 DIAGNOSIS — G894 Chronic pain syndrome: Secondary | ICD-10-CM | POA: Diagnosis not present

## 2022-04-30 DIAGNOSIS — Z0289 Encounter for other administrative examinations: Secondary | ICD-10-CM | POA: Insufficient documentation

## 2022-04-30 DIAGNOSIS — Z79891 Long term (current) use of opiate analgesic: Secondary | ICD-10-CM | POA: Diagnosis not present

## 2022-04-30 DIAGNOSIS — N186 End stage renal disease: Secondary | ICD-10-CM | POA: Insufficient documentation

## 2022-04-30 DIAGNOSIS — G9341 Metabolic encephalopathy: Secondary | ICD-10-CM | POA: Diagnosis not present

## 2022-04-30 MED ORDER — HYDROCODONE-ACETAMINOPHEN 7.5-325 MG PO TABS: 1.0000 | ORAL_TABLET | Freq: Four times a day (QID) | ORAL | 0 refills | Status: AC | PRN

## 2022-04-30 MED ORDER — HYDROCODONE-ACETAMINOPHEN 5-325 MG PO TABS: 1.0000 | ORAL_TABLET | Freq: Four times a day (QID) | ORAL | 0 refills | Status: DC | PRN

## 2022-04-30 NOTE — Progress Notes (Signed)
PROVIDER NOTE: Information contained herein reflects review and annotations entered in association with encounter. Interpretation of such information and data should be left to medically-trained personnel. Information provided to patient can be located elsewhere in the medical record under "Patient Instructions". Document created using STT-dictation technology, any transcriptional errors that may result from process are unintentional.    Patient: Mark Jumbo Sr.  Service Category: E/M  Provider: [name], MD  DOB: 1941/05/26  DOS: 04/30/2022  Specialty: Interventional Pain Management  MRN: [id number]  Setting: Ambulatory outpatient  PCP: [name], MD  Type: Established Patient    Referring Provider: [name], MD  Location: Office  Delivery: Face-to-face     HPI  [name], a 81 y.o. year old male, is here today because of his Chronic pain syndrome [G89.4]. [name] primary complain today is Leg Pain (lower) and Foot Pain (Right) Last encounter: My last encounter with him was on 01/31/22 Pertinent problems: [name] has End stage renal disease ([place]); Pain management contract signed; Calciphylaxis of left lower extremity with nonhealing ulcer with necrosis of muscle ([place]); and Chronic pain syndrome on their pertinent problem list. Pain Assessment: Severity of   is reported as a 0-No pain/10. Location: Leg Left/below knee to ankle. Onset: More than a month ago. Quality: Sharp. Timing: Intermittent. Modifying factor(s): meds. Vitals:  height is _0  (1.727 m) and weight is 164 lb (74.4 kg). His temporal temperature is 97.2 F (36.2 C) (abnormal). His blood pressure is 146/77 (abnormal) and his pulse is 62. His respiration is 17 and oxygen saturation is 100%.   Reason for encounter: medication management.    [name] presents today for medication management.  He has been released from the wound care clinic.  He continues to participate in dialysis. We  discussed weaning his hydrocodone down.  Recommend 7.5 mg every 6 hours as needed then decrease to 5 mg every 6 hours as needed and then reevaluate.  Pharmacotherapy Assessment  Analgesic: Hydrocodone 10 mg every 6 hours as needed, #120/month MME= 40.}   Monitoring: [place] PMP: PDMP reviewed during this encounter.       Pharmacotherapy: No side-effects or adverse reactions reported. Compliance: No problems identified. Effectiveness: Clinically acceptable.  Rise Patience, RN  04/30/2022  1:27 PM  Sign when Signing Visit Nursing Pain Medication Assessment:  Safety precautions to be maintained throughout the outpatient stay will include: orient to surroundings, keep bed in low position, maintain call bell within reach at all times, provide assistance with transfer out of bed and ambulation.  Medication Inspection Compliance: Pill count conducted under aseptic conditions, in front of the patient. Neither the pills nor the bottle was removed from the patient's sight at any time. Once count was completed pills were immediately returned to the patient in their original bottle.  Medication: Hydrocodone/APAP Pill/Patch Count:  28 of 120 pills remain Pill/Patch Appearance: Markings consistent with prescribed medication Bottle Appearance: Standard pharmacy container. Clearly labeled. Filled Date: 8 / 3 / 2023 Last Medication intake:  Today     UDS:  Summary  Date Value Ref Range Status  09/06/2021 Note  Final    Comment:    ==================================================================== [employer] [facility] (MW) ==================================================================== Test                             Result       Flag       Units  Drug Present  and Declared for Prescription Verification   Hydrocodone                    [phone]       EXPECTED   ng/mg creat   Hydromorphone                  1019         EXPECTED   ng/mg creat   Dihydrocodeine                 1089         EXPECTED    ng/mg creat   Norhydrocodone                 7886         EXPECTED   ng/mg creat    Sources of hydrocodone include scheduled prescription medications.    Hydromorphone, dihydrocodeine and norhydrocodone are expected    metabolites of hydrocodone. Hydromorphone and dihydrocodeine are    also available as scheduled prescription medications.  Drug Present not Declared for Prescription Verification   Desmethyldiazepam              40           UNEXPECTED ng/mg creat   Oxazepam                       194          UNEXPECTED ng/mg creat    Desmethyldiazepam and oxazepam are benzodiazepine drugs, but may    also be present as common metabolites of other benzodiazepine drugs,    including diazepam, chlordiazepoxide, prazepam, clorazepate, and    halazepam.  ==================================================================== Test                      Result    Flag   Units      Ref Range   Creatinine              63               mg/dL      >=20 ==================================================================== Declared Medications:  The flagging and interpretation on this report are based on the  following declared medications.  Unexpected results may arise from  inaccuracies in the declared medications.   **Note: The testing scope of this panel includes these medications:   Hydrocodone (Norco)   **Note: The testing scope of this panel does not include the  following reported medications:   Acetaminophen (Norco)  Aspirin  Calcitriol  Diltiazem  Insulin (NovoLog)  Metoprolol  Pancrelipase  Pravastatin  Sevelamer (Renvela)  Sodium Bicarbonate  Topical ==================================================================== For clinical consultation, please call [phone]. ====================================================================      ROS  Constitutional: Denies any fever or chills Gastrointestinal: No reported hemesis, hematochezia, vomiting, or acute GI  distress Musculoskeletal:  Left leg pain Neurological: No reported episodes of acute onset apraxia, aphasia, dysarthria, agnosia, amnesia, paralysis, loss of coordination, or loss of consciousness  Medication Review  DIALYVITE 800 WITH ZINC, HYDROcodone-acetaminophen, aspirin EC, cinacalcet, diltiazem, insulin aspart, insulin glargine, lidocaine-prilocaine, lipase/protease/amylase, metoprolol tartrate, pravastatin, and sevelamer carbonate  History Review  Allergy: Mr. Cullens is allergic to enalapril maleate. Drug: Mr. Depass  reports no history of drug use. Alcohol:  reports no history of alcohol use. Tobacco:  reports that he has never smoked. He has never used smokeless tobacco. Social: Mr. Wiebelhaus  reports that he has never smoked. He has never used  smokeless tobacco. He reports that he does not drink alcohol and does not use drugs. Medical:  has a past medical history of Arthritis, Chronic back pain, Chronic kidney disease, Coronary atherosclerosis of native coronary artery, Deafness in right ear, Diabetes mellitus, Diabetic retinopathy ([place]), Diastolic dysfunction (0/4888), Essential hypertension, benign, Heart murmur, HOH (hard of hearing), Hyperkalemia, Kidney stones, Mixed hyperlipidemia, NAFLD (nonalcoholic fatty liver disease), Pancreatic insufficiency, Prostate cancer ([place]) (2011), Shortness of breath dyspnea, Subdural hematoma ([place]), Type 2 diabetes mellitus ([place]), and Wears hearing aid. Surgical: Mr. Heacock  has a past surgical history that includes Prostatectomy (2011); Hernia repair; Appendectomy; Cholecystectomy; Bilateral hip replacement; Cataract extraction w/PHACO (Left, 05/15/2015); Cataract extraction w/PHACO (Right, 08/28/2015); Insertion of dialysis catheter (Right, 01/22/2021); Insertion of dialysis catheter (Right, 04/16/2021); and AV fistula placement (Left, 05/15/2021). Family: family history includes Diabetes type II in his mother; Heart attack in his father.  Laboratory  Chemistry Profile   Renal Lab Results  Component Value Date   BUN 39 (H) 05/15/2021   CREATININE 3.00 (H) 05/15/2021   LABCREA 35.88 01/16/2021   BCR 19 04/12/2021   GFR 65.09 01/29/2017   GFRAA 42 (L) 12/30/2019   GFRNONAA 18 (L) 04/18/2021   GFRNONAA 17 (L) 04/18/2021    Hepatic Lab Results  Component Value Date   AST 16 04/18/2021   ALT 9 04/18/2021   ALBUMIN 2.0 (L) 04/18/2021   ALBUMIN 2.0 (L) 04/18/2021   ALKPHOS 143 (H) 04/18/2021   LIPASE 21 12/23/2020   AMMONIA 50 12/23/2019    Electrolytes Lab Results  Component Value Date   NA 136 05/15/2021   K 4.2 05/15/2021   CL 99 05/15/2021   CALCIUM 7.3 (L) 04/18/2021   CALCIUM 7.1 (L) 04/18/2021   MG 1.7 02/06/2021   PHOS 6.0 (H) 04/18/2021    Bone Lab Results  Component Value Date   VD25OH 25.27 (L) 12/28/2020   TESTOFREE 43.1 (L) 02/28/2015   TESTOSTERONE 295 11/25/2017    Inflammation (CRP: Acute Phase) (ESR: Chronic Phase) Lab Results  Component Value Date   CRP 14.5 (H) 01/09/2021   ESRSEDRATE 11 12/02/2018   LATICACIDVEN 1.2 04/13/2021         Note: Above Lab results reviewed.  Recent Imaging Review  VAS [place] ABI WITH/WO TBI  LOWER EXTREMITY DOPPLER STUDY  Patient Name:  BRIGIDO MERA Wareing SR.  Date of Exam:   06/06/2021 Medical Rec #: [id number]             Accession #:    [id number] Date of Birth: [birth year]             Patient Gender: M Patient Age:   67 years Exam Location:  [facility] Procedure:      VAS [place] ABI WITH/WO TBI Referring Phys: Jenna Luo  --------------------------------------------------------------------------------   Indications: Left leg pain.  High Risk Factors: Diabetes.  Other Factors: Calciphylaxis.  Performing Technologist: Ralene Cork RVT    Examination Guidelines: A complete evaluation includes at minimum, Doppler waveform signals and systolic blood pressure reading at the level of bilateral brachial, anterior tibial, and posterior tibial  arteries, when vessel segments are accessible. Bilateral testing is considered an integral part of a complete examination. Photoelectric Plethysmograph (PPG) waveforms and toe systolic pressure readings are included as required and additional duplex testing as needed. Limited examinations for reoccurring indications may be performed as noted.    ABI Findings: +---------+------------------+-----+----------+--------+ Right    Rt Pressure (mmHg)IndexWaveform  Comment  +---------+------------------+-----+----------+--------+ Brachial 130                                       +---------+------------------+-----+----------+--------+  PTA      255               1.96 biphasic           +---------+------------------+-----+----------+--------+ DP       255               1.96 monophasic         +---------+------------------+-----+----------+--------+ Great Toe255               1.96                    +---------+------------------+-----+----------+--------+  +---------+------------------+-----+--------+-------+ Left     Lt Pressure (mmHg)IndexWaveformComment +---------+------------------+-----+--------+-------+ PTA      255               1.96 biphasic        +---------+------------------+-----+--------+-------+ DP       255               1.96 biphasic        +---------+------------------+-----+--------+-------+ Great Toe253               1.95                 +---------+------------------+-----+--------+-------+  +-------+-----------+-----------+------------+------------+ ABI/TBIToday's ABIToday's TBIPrevious ABIPrevious TBI +-------+-----------+-----------+------------+------------+ Right  [place]         [place]                                  +-------+-----------+-----------+------------+------------+ Left   [place]         [place]                                  +-------+-----------+-----------+------------+------------+     No  previous ABI.   Summary: Right: Resting right ankle-brachial index indicates noncompressible right lower extremity arteries. The right toe-brachial index is abnormal.  Left: Resting left ankle-brachial index indicates noncompressible left lower extremity arteries. The left toe-brachial index is abnormal.    *See table(s) above for measurements and observations.    Electronically signed by Curt Jews MD on 06/06/2021 at 3:02:04 PM.       Final   Note: Reviewed        Physical Exam  General appearance: Well nourished, well developed, and well hydrated. In no apparent acute distress Mental status: Alert, oriented x 3 (person, place, & time)       Respiratory: No evidence of acute respiratory distress Eyes: PERLA Vitals: BP (!) 146/77   Pulse 62   Temp (!) 97.2 F (36.2 C) (Temporal)   Resp 17   Ht _0  (1.727 m)   Wt 164 lb (74.4 kg)   SpO2 100%   BMI 24.94 kg/m  BMI: Estimated body mass index is 24.94 kg/m as calculated from the following:   Height as of this encounter: _1  (1.727 m).   Weight as of this encounter: 164 lb (74.4 kg). Ideal: Ideal body weight: 68.4 kg (150 lb 12.7 oz) Adjusted ideal body weight: 70.8 kg (156 lb 1.2 oz)  Left lower extremity ulcer, erythematous rash, necrotic eschar  Assessment   Diagnosis Status  1. Chronic pain syndrome   2. Calciphylaxis of left lower extremity with nonhealing ulcer with necrosis of muscle ([place])   3. End stage renal disease ([place])   4. Type 2 diabetes mellitus with diabetic polyneuropathy, with long-term current  use of insulin ([place])   5. Encounter for long-term opiate analgesic use   6. Pain management contract signed    Controlled Controlled Controlled   Plan of Care    Mr. THUNDER BRIDGEWATER Sr. has a current medication list which includes the following long-term medication(s): diltiazem, insulin aspart, insulin glargine, metoprolol tartrate, and pravastatin.  Wean hydrocodone as below  Pharmacotherapy  (Medications Ordered): Meds ordered this encounter  Medications   HYDROcodone-acetaminophen (NORCO) 7.5-325 MG tablet    Sig: Take 1 tablet by mouth every 6 (six) hours as needed for severe pain. Must last 30 days.    Dispense:  120 tablet    Refill:  0    Chronic Pain: STOP Act (Not applicable) Fill 1 day early if closed on refill date. Avoid benzodiazepines within 8 hours of opioids   HYDROcodone-acetaminophen (NORCO/VICODIN) 5-325 MG tablet    Sig: Take 1 tablet by mouth every 6 (six) hours as needed for severe pain. Must last 30 days.    Dispense:  120 tablet    Refill:  0    Chronic Pain: STOP Act (Not applicable) Fill 1 day early if closed on refill date. Avoid benzodiazepines within 8 hours of opioids    Follow-up plan:   Return in about 8 weeks (around 06/25/2022) for Medication Management, in person.    Recent Visits Date Type Provider Dept  01/31/22 Office Visit Mark Santa, MD [facility]  Showing recent visits within past 90 days and meeting all other requirements Today's Visits Date Type Provider Dept  04/30/22 Office Visit Mark Santa, MD [facility]  Showing today's visits and meeting all other requirements Future Appointments Date Type Provider Dept  06/18/22 Appointment Mark Santa, MD [facility]  Showing future appointments within next 90 days and meeting all other requirements  I discussed the assessment and treatment plan with the patient. The patient was provided an opportunity to ask questions and all were answered. The patient agreed with the plan and demonstrated an understanding of the instructions.  Patient advised to call back or seek an in-person evaluation if the symptoms or condition worsens.  Duration of encounter: 18mnutes.  Note by: BGillis Santa MD Date: 04/30/2022; Time: 2:17 PM

## 2022-04-30 NOTE — Progress Notes (Signed)
Nursing Pain Medication Assessment:  Safety precautions to be maintained throughout the outpatient stay will include: orient to surroundings, keep bed in low position, maintain call bell within reach at all times, provide assistance with transfer out of bed and ambulation.  Medication Inspection Compliance: Pill count conducted under aseptic conditions, in front of the patient. Neither the pills nor the bottle was removed from the patient's sight at any time. Once count was completed pills were immediately returned to the patient in their original bottle.  Medication: Hydrocodone/APAP Pill/Patch Count:  28 of 120 pills remain Pill/Patch Appearance: Markings consistent with prescribed medication Bottle Appearance: Standard pharmacy container. Clearly labeled. Filled Date: 58 / 3 / 2023 Last Medication intake:  Today

## 2022-04-30 NOTE — Telephone Encounter (Signed)
Letter received that Lidocaine-Prilocaine 2.5%-2.5% approved by [employer], [employer], 04/26/2022-07/26/2022. Pt advised and verbalized understanding of all.

## 2022-04-30 NOTE — Telephone Encounter (Signed)
Appro[employer]on September 22 22-SEP-23:22-DEC-23 Lidocaine-Prilocaine 2.5-2.5% EX CREA Quantity:30; Drug Lidocaine-Prilocaine 2.5-2.5% cream

## 2022-05-02 ENCOUNTER — Ambulatory Visit (INDEPENDENT_AMBULATORY_CARE_PROVIDER_SITE_OTHER): Payer: PPO | Admitting: Family Medicine

## 2022-05-02 DIAGNOSIS — E1142 Type 2 diabetes mellitus with diabetic polyneuropathy: Secondary | ICD-10-CM

## 2022-05-02 DIAGNOSIS — N186 End stage renal disease: Secondary | ICD-10-CM

## 2022-05-02 DIAGNOSIS — Z8679 Personal history of other diseases of the circulatory system: Secondary | ICD-10-CM | POA: Diagnosis not present

## 2022-05-02 MED ORDER — METOPROLOL TARTRATE 25 MG PO TABS: 25.0000 mg | ORAL_TABLET | Freq: Two times a day (BID) | ORAL | 3 refills | Status: DC

## 2022-05-02 NOTE — Progress Notes (Signed)
Subjective:    Patient ID: Mark Hose., male    DOB: [birth year], 81 y.o.   MRN: [id number]  HPI 04/06/21 Patient presents today with a very complex problem.  He has a very painful rash over his posterior left calf and left thigh.  The rash is a reddish violaceous linear patch.  It appears to be libido reticularis.  It extends all over to the posterior lateral left calf where there is a small black scab consistent with necrotic eschar that is approximately 1.5 cm in diameter.  The rash is tender to the touch.  There is no evidence of secondary infection.  Past medical history is significant for end-stage renal disease having just stopped dialysis.  Rash appears to be consistent with calciphylaxis.  I reviewed his hospital records.  His last phosphate level was 4.0 July 4 of this year.  His last PTH level was 298.  That was May 26 of this year his last calcium level was 7.1 with an albumin of 3.5 which corrects to a calcium level of 7.9.  As result he is on calcitriol in addition to calcium acetate as a phosphate binder.  At that time, my plan was: This presents a diagnostic challenge.  The patient has hypocalcemia even when corrected.  Therefore he is on a calcium containing phosphate binder/PhosLo in addition to calcitriol.  My understanding with calciphylaxis is we need to try to drive his phosphate level down to 3.5 if possible using non calcium containing phosphate binder such as Sevelamer.  We should also consider adding Cinacalcet to achieve a PTH level less than 300 and is close to 150 as possible.  We should avoid vitamin D analogs such as calcitriol however this would certainly complicate his hypocalcemia.  He is not on iron or warfarin.  He is not currently receiving dialysis.  Therefore I would like to run this by his nephrologist (Dr. Royce Macadamia) as I am not sure how the changes that I am discussing above would affect his hypocalcemia.  Addendum: I was able to speak with his nephrologist,  Dr. Royce Macadamia.  I really appreciate her time, her insight, and her assistance.  She is going to work the patient in next week to take a look at the leg and see if this is in fact calciphylaxis.  Tentatively our plan will be to reduce the calcitriol to every other day and switch off the PhosLo.  They will also decide about Sensipar at that visit and discussed dialysis as well.  Again I appreciate her time and her help. 05/08/21 Patient is now on Monday Wednesday Friday dialysis.  He reports severe pain in both legs specifically in the left leg and areas of dry gangrene due to severe calciphylaxis.  There are 3 patches of necrotic eschar on his left leg.  Each is approximately 4 cm x 6 cm.  There is no surrounding area of cellulitis or drainage.  However they are extremely painful.  He is having to take 2 hydrocodone twice a day just to tolerate the pain.  The pain is worse when his legs are elevated.  If he can leave his legs dangling allowing gravity to improve blood flow the pain is better.  Patient is not certain if he is still taking a calcium containing phosphate binder.  PhosLo is still on his list.  I believe nephrology wanted to transition him to Sevelamer.  He is also not sure if he is on Cinacalcet.  He is also uncertain  if he is receiving sodium thiosulfate IV at dialysis.  He is scheduled to see general surgery tomorrow for a biopsy that was requested by nephrology to confirm calciphylaxis.  Wound care has refused to see the patient in [place] however he would benefit from wound care managing the necrotic eschar/dry gangrene on his left lower extremity.  At that time, my plan was:  Patient has severe calciphylaxis.  He has necrotic eschar/dry gangrene on his left lower extremity.  There are 3 patches roughly 4 x 6 cm that are extremely painful.  There isno evidence of secondary infection.  He has an appointment scheduled by nephrology for him to meet with the general surgeons to perform a biopsy I  assume to confirm calciphylaxis.  He has been refused by wound care.  Therefore I am going to consult a wound clinic in [place] to help manage these nonhealing wounds on his left leg.  I am also going to schedule the patient for vascular ultrasound to rule out any large vessel disease in the left lower extremity that may be contributing however I do feel that this is most likely calciphylaxis.  I would maximize his pain control.  I will refill his Norco 5/325.  He can take 1 to 2 tablets twice daily/120 pills a month for pain control.  He would benefit from being on Sevelamer rather than PhosLo.  I have asked the wife to go home and verify which phosphate binder he is taking.  I will also likely start the patient on Cinacalcet to suppress PTH in an effort to help prevent further calciphylaxis.  I do believe he would benefit from sodium thiosulfate to help manage the calciphylaxis as well and so I have asked him to verify with his nephrologist tomorrow at dialysis if he is receiving the sodium thiosulfate IV at dialysis.  If not, perhaps nephrology can schedule that for him.  05/02/22 I have not seen the patient in quite some time.  He is now receiving dialysis 3 times a week.  He was seen in the wound clinic for the nonhealing wounds on his left leg but thankfully the wounds have healed per the last note from 04/02/22.  He is seeing endocrinology to manage his diabetes.  I have referred the patient to pain clinic due to the severity of the pain that he was experiencing in the left leg from the nonhealing wounds due to calciphylaxis.  He is currently receiving narcotic pain medication from the pain clinic.  When I last saw the patient, he was weak, lethargic, confused and delirious.  He looks so much better today.  He is awake and alert and oriented.  He is ambulating using a walker.  He is bright and answering questions appropriately.  His mental status is improved dramatically.  The wound on his left leg has  completely healed.  He is now weaning off of pain medication gradually.  He is stepping down from 10-7.5 and plans to step down to 5 in a month.  However he is reporting trouble sleeping.  He continues to have a difficult time, "shutting down his mind at night".  He thinks about his son who passed away.  His daughter-in-law will recently died on dialysis.  He states that every day he goes to dialysis he worries about another friend who is died that he has not seen there.  He states that he is surrounded by death and this can be very anxiety provoking for him.  He would like  something that he can take at night to help him sleep Past Medical History:  Diagnosis Date   Arthritis    lower back   Chronic back pain    Disc disease   Chronic kidney disease    Coronary atherosclerosis of native coronary artery    Coronary calcifications by chest CT, Myoview demonstrating inferolateral scar   Deafness in right ear    Diabetes mellitus    Diabetic retinopathy (HCC)    moderate nonproliferative with macular edema in right eye   Diastolic dysfunction 08/3242   Grade 1.   Essential hypertension, benign    Heart murmur    in past   HOH (hard of hearing)    Hyperkalemia    Kidney stones    Mixed hyperlipidemia    NAFLD (nonalcoholic fatty liver disease)    Pancreatic insufficiency    Diagnosed at [facility]   Prostate cancer [facility]) 2011   Shortness of breath dyspnea    occasional - liver pressing on right lung, decreased capacity   Subdural hematoma (HCC)    Type 2 diabetes mellitus ([place])    Wears hearing aid    "crossover" form right to left   Past Surgical History:  Procedure Laterality Date   APPENDECTOMY     AV FISTULA PLACEMENT Left 05/15/2021   Procedure: INSERTION OF LEFT ARM ARTERIOVENOUS (AV) GORE-TEX GRAFT;  Surgeon: Rosetta Posner, MD;  Location: [facility];  Service: Vascular;  Laterality: Left;   Bilateral hip replacement     CATARACT EXTRACTION W/PHACO Left 05/15/2015   Procedure:  CATARACT EXTRACTION PHACO AND INTRAOCULAR LENS PLACEMENT ([place]  Surgeon: Ronnell Freshwater, MD;  Location: [facility];  Service: Ophthalmology;  Laterality: Left;  DIABETIC - insulin pump and oral meds   CATARACT EXTRACTION W/PHACO Right 08/28/2015   Procedure: CATARACT EXTRACTION PHACO AND INTRAOCULAR LENS PLACEMENT ([facility]);  Surgeon: Ronnell Freshwater, MD;  Location: [facility];  Service: Ophthalmology;  Laterality: Right;  DIABETIC PER PT AND CINDY PLEASE KEEP ARRIVAL TIME AFTER 8AM    CHOLECYSTECTOMY     HERNIA REPAIR     INSERTION OF DIALYSIS CATHETER Right 01/22/2021   Procedure: INSERTION OF DIALYSIS CATHETER;  Surgeon: Virl Cagey, MD;  Location: [facility];  Service: General;  Laterality: Right;   INSERTION OF DIALYSIS CATHETER Right 04/16/2021   Procedure: INSERTION OF DIALYSIS CATHETER;  Surgeon: Virl Cagey, MD;  Location: [facility];  Service: General;  Laterality: Right;   PROSTATECTOMY  2011   Current Outpatient Medications on File Prior to Visit  Medication Sig Dispense Refill   aspirin EC 81 MG tablet Take 81 mg by mouth at bedtime.  (Patient not taking: Reported on 04/30/2022)     B Complex-C-Zn-Folic Acid (DIALYVITE 010 WITH ZINC) 0.8 MG TABS Take 1 tablet by mouth daily. (Patient not taking: Reported on 04/30/2022)     cinacalcet (SENSIPAR) 30 MG tablet SMARTSIG:1 Tablet(s) By Mouth Every Evening     CREON 12000-38000 units CPEP capsule Take by mouth.     diltiazem (CARDIZEM CD) 240 MG 24 hr capsule TAKE 1 CAPSULE(240 MG) BY MOUTH DAILY 90 capsule 3   [START ON 05/06/2022] HYDROcodone-acetaminophen (NORCO) 7.5-325 MG tablet Take 1 tablet by mouth every 6 (six) hours as needed for severe pain. Must last 30 days. 120 tablet 0   [START ON 05/30/2022] HYDROcodone-acetaminophen (NORCO/VICODIN) 5-325 MG tablet Take 1 tablet by mouth every 6 (six) hours as needed for severe pain. Must last 30 days. 120 tablet  0   insulin aspart (NOVOLOG) 100 UNIT/ML  injection Inject 8 Units into the skin 3 (three) times daily with meals. (Patient taking differently: Inject 6 Units into the skin in the morning.) 10 mL 11   insulin glargine (LANTUS) 100 UNIT/ML injection Inject 0.12 mLs (12 Units total) into the skin at bedtime. (Patient taking differently: Inject 6 Units into the skin in the morning.) 10 mL 11   lidocaine-prilocaine (EMLA) cream APPLY SMALL AMOUNT TO ACCESS SITE (AVF) 30 MINUTES BEFORE DIALYSIS. COVER WITH OCCLUSIVE DRESSING (SARAN WRAP) 30 g 11   metoprolol tartrate (LOPRESSOR) 50 MG tablet TAKE 1 TABLET(50 MG) BY MOUTH TWICE DAILY (Patient taking differently: 25 mg.) 180 tablet 1   pravastatin (PRAVACHOL) 10 MG tablet TAKE 1 TABLET(10 MG) BY MOUTH DAILY WITH BREAKFAST 90 tablet 1   sevelamer carbonate (RENVELA) 800 MG tablet Take by mouth. (Patient not taking: Reported on 04/30/2022)     No current facility-administered medications on file prior to visit.   Marland Kitchenall Social History   Socioeconomic History   Marital status: Married    Spouse name: Darlene   Number of children: 3   Years of education: Not on file   Highest education level: Not on file  Occupational History   Not on file  Tobacco Use   Smoking status: Never   Smokeless tobacco: Never  Vaping Use   Vaping Use: Never used  Substance and Sexual Activity   Alcohol use: No   Drug use: No   Sexual activity: Yes  Other Topics Concern   Not on file  Social History Narrative   3 children, 1 daughter and 1 son sorry deceased. 1 daughter living in [place] and 1 step child   Married x 13 years 08/25/21.   7 grandchildren   1 great grandchild   Social Determinants of Health   Financial Resource Strain: Low Risk  (08/16/2021)   Overall Financial Resource Strain (CARDIA)    Difficulty of Paying Living Expenses: Not hard at all  Food Insecurity: No Food Insecurity (08/16/2021)   Hunger Vital Sign    Worried About Running Out of Food in the Last Year: Never true    Ran Out of  Food in the Last Year: Never true  Transportation Needs: No Transportation Needs (08/16/2021)   PRAPARE - Hydrologist (Medical): No    Lack of Transportation (Non-Medical): No  Physical Activity: Inactive (08/16/2021)   Exercise Vital Sign    Days of Exercise per Week: 0 days    Minutes of Exercise per Session: 0 min  Stress: No Stress Concern Present (08/16/2021)   [place]    Feeling of Stress : Not at all  Social Connections: Moderately Isolated (08/16/2021)   Social Connection and Isolation Panel [NHANES]    Frequency of Communication with Friends and Family: More than three times a week    Frequency of Social Gatherings with Friends and Family: Three times a week    Attends Religious Services: Never    Active Member of Clubs or Organizations: No    Attends Archivist Meetings: Never    Marital Status: Married  Human resources officer Violence: Not At Risk (08/16/2021)   Humiliation, Afraid, Rape, and Kick questionnaire    Fear of Current or Ex-Partner: No    Emotionally Abused: No    Physically Abused: No    Sexually Abused: No      Review of Systems  All other systems reviewed and are negative.      Objective:   Physical Exam Constitutional:      General: He is not in acute distress.    Appearance: Normal appearance. He is normal weight. He is not ill-appearing or toxic-appearing.  Cardiovascular:     Rate and Rhythm: Normal rate. Rhythm irregular.     Heart sounds: Normal heart sounds.  Pulmonary:     Effort: Pulmonary effort is normal.     Breath sounds: Rales present.  Abdominal:     General: Abdomen is flat. Bowel sounds are normal.     Palpations: Abdomen is soft.  Musculoskeletal:     Right lower leg: No edema.     Left lower leg: No edema.  Skin:    Findings: No erythema or rash.  Neurological:     Mental Status: He is alert.         Assessment &  Plan:  Calciphylaxis  History of ASCVD  ESRD (end stage renal disease) ([place])  DM type 2 with diabetic peripheral neuropathy ([place]) Compared to last year, the patient is doing remarkably well.  His mental status has improved.  His energy level has improved.  He is seeing an endocrinologist and his A1c was last 6.2 despite only taking 6 units of Lantus every day and 6 units of rapid acting insulin in the morning.  He denies any hypoglycemic episodes.  He is tolerating dialysis well without complication.  The calciphylaxis has resolved.  Wound is healed.  His pain has resolved and he is weaning off pain medication.  I agreed to restart the Valium once he weans off the hydrocodone.  He can take 2 mg at night to help him sleep.  But I would like him to be off all pain medication first.  I recommended a flu shot as well as a COVID booster.  His labs are being checked at multiple different doctors offices so I see no reason to repeat these today

## 2022-05-04 DIAGNOSIS — N186 End stage renal disease: Secondary | ICD-10-CM | POA: Diagnosis not present

## 2022-05-04 DIAGNOSIS — E1122 Type 2 diabetes mellitus with diabetic chronic kidney disease: Secondary | ICD-10-CM | POA: Diagnosis not present

## 2022-05-04 DIAGNOSIS — Z992 Dependence on renal dialysis: Secondary | ICD-10-CM | POA: Diagnosis not present

## 2022-05-06 ENCOUNTER — Telehealth: Payer: Self-pay | Admitting: Student in an Organized Health Care Education/Training Program

## 2022-05-06 DIAGNOSIS — N2581 Secondary hyperparathyroidism of renal origin: Secondary | ICD-10-CM | POA: Diagnosis not present

## 2022-05-06 DIAGNOSIS — Z992 Dependence on renal dialysis: Secondary | ICD-10-CM | POA: Diagnosis not present

## 2022-05-06 DIAGNOSIS — E876 Hypokalemia: Secondary | ICD-10-CM | POA: Diagnosis not present

## 2022-05-06 DIAGNOSIS — N186 End stage renal disease: Secondary | ICD-10-CM | POA: Diagnosis not present

## 2022-05-06 DIAGNOSIS — D689 Coagulation defect, unspecified: Secondary | ICD-10-CM | POA: Diagnosis not present

## 2022-05-06 NOTE — Telephone Encounter (Signed)
PT stated that the pharmacy stated that they needed an over ride to fill patient prescription early. PT refilled isn't due until the 5th of this month. However patient stated that he is already out of meds. Please give patient a call. Thanks

## 2022-05-06 NOTE — Telephone Encounter (Signed)
Called patient back and he states he can not get fillHydrocodone filled til 10-5  and will be out. Informed patient that prescription should last 30 days as prescriped. Pt to call back if needed.

## 2022-05-13 DIAGNOSIS — D689 Coagulation defect, unspecified: Secondary | ICD-10-CM | POA: Diagnosis not present

## 2022-05-13 DIAGNOSIS — N186 End stage renal disease: Secondary | ICD-10-CM | POA: Diagnosis not present

## 2022-05-13 DIAGNOSIS — Z992 Dependence on renal dialysis: Secondary | ICD-10-CM | POA: Diagnosis not present

## 2022-05-13 DIAGNOSIS — N2581 Secondary hyperparathyroidism of renal origin: Secondary | ICD-10-CM | POA: Diagnosis not present

## 2022-05-13 DIAGNOSIS — E876 Hypokalemia: Secondary | ICD-10-CM | POA: Diagnosis not present

## 2022-05-20 DIAGNOSIS — D689 Coagulation defect, unspecified: Secondary | ICD-10-CM | POA: Diagnosis not present

## 2022-05-20 DIAGNOSIS — N2581 Secondary hyperparathyroidism of renal origin: Secondary | ICD-10-CM | POA: Diagnosis not present

## 2022-05-20 DIAGNOSIS — E1122 Type 2 diabetes mellitus with diabetic chronic kidney disease: Secondary | ICD-10-CM | POA: Diagnosis not present

## 2022-05-20 DIAGNOSIS — E039 Hypothyroidism, unspecified: Secondary | ICD-10-CM | POA: Diagnosis not present

## 2022-05-20 DIAGNOSIS — Z992 Dependence on renal dialysis: Secondary | ICD-10-CM | POA: Diagnosis not present

## 2022-05-20 DIAGNOSIS — N186 End stage renal disease: Secondary | ICD-10-CM | POA: Diagnosis not present

## 2022-05-23 ENCOUNTER — Ambulatory Visit (INDEPENDENT_AMBULATORY_CARE_PROVIDER_SITE_OTHER): Payer: PPO | Admitting: Family Medicine

## 2022-05-23 VITALS — BP 118/62 | HR 83 | Ht 68.0 in | Wt 164.0 lb

## 2022-05-23 DIAGNOSIS — N186 End stage renal disease: Secondary | ICD-10-CM

## 2022-05-23 DIAGNOSIS — E1142 Type 2 diabetes mellitus with diabetic polyneuropathy: Secondary | ICD-10-CM | POA: Diagnosis not present

## 2022-05-23 MED ORDER — AMITRIPTYLINE HCL 25 MG PO TABS: 25.0000 mg | ORAL_TABLET | Freq: Every day | ORAL | 0 refills | Status: DC

## 2022-05-23 NOTE — Progress Notes (Signed)
Subjective:    Patient ID: [name]., male    DOB: 1940-09-04, 81 y.o.   MRN: [id number]  HPI 05/02/22 I have not seen the patient in quite some time.  He is now receiving dialysis 3 times a week.  He was seen in the wound clinic for the nonhealing wounds on his left leg but thankfully the wounds have healed per the last note from 04/02/22.  He is seeing endocrinology to manage his diabetes.  I have referred the patient to pain clinic due to the severity of the pain that he was experiencing in the left leg from the nonhealing wounds due to calciphylaxis.  He is currently receiving narcotic pain medication from the pain clinic.  When I last saw the patient, he was weak, lethargic, confused and delirious.  He looks so much better today.  He is awake and alert and oriented.  He is ambulating using a walker.  He is bright and answering questions appropriately.  His mental status is improved dramatically.  The wound on his left leg has completely healed.  He is now weaning off of pain medication gradually.  He is stepping down from 10-7.5 and plans to step down to 5 in a month.  However he is reporting trouble sleeping.  He continues to have a difficult time, "shutting down his mind at night".  He thinks about his son who passed away.  His daughter-in-law will recently died on dialysis.  He states that every day he goes to dialysis he worries about another friend who is died that he has not seen there.  He states that he is surrounded by death and this can be very anxiety provoking for him.  He would like something that he can take at night to help him sleep.  At that time, my plan was: Compared to last year, the patient is doing remarkably well.  His mental status has improved.  His energy level has improved.  He is seeing an endocrinologist and his A1c was last 6.2 despite only taking 6 units of Lantus every day and 6 units of rapid acting insulin in the morning.  He denies any hypoglycemic  episodes.  He is tolerating dialysis well without complication.  The calciphylaxis has resolved.  Wound is healed.  His pain has resolved and he is weaning off pain medication.  I agreed to restart the Valium once he weans off the hydrocodone.  He can take 2 mg at night to help him sleep.  But I would like him to be off all pain medication first.  I recommended a flu shot as well as a COVID booster.  His labs are being checked at multiple different doctors offices so I see no reason to repeat these today  05/23/22 Patient is here today complaining of pain in his right leg.  He describes it as almost an electrical pain.  He reports sharp stabbing pain that occurred suddenly without warning in his anterior right leg.  There is no visible rash.  There is no open wound.  He has a history of severe calciphylaxis in the left leg however those wounds have finally healed.  He is doing well on dialysis.  Therefore I do not believe that the pain is likely related to calciphylaxis as there is no visible sign of that today on exam.  He also reports numbness in both feet.  He states that he can even feel when his wife puts his socks on.  I  checked his arterial studies last year in November.  His ABI is markedly abnormal due to calcification of the blood vessels.  Today on exam he has a weakly palpable pulse in both dorsalis pedis.  There is no skin ulcer or wound.  There is no evidence of cellulitis.  However the pain sounds more neuropathic.  He denies any claudication with ambulation Past Medical History:  Diagnosis Date   Arthritis    lower back   Chronic back pain    Disc disease   Chronic kidney disease    Coronary atherosclerosis of native coronary artery    Coronary calcifications by chest CT, Myoview demonstrating inferolateral scar   Deafness in right ear    Diabetes mellitus    Diabetic retinopathy (HCC)    moderate nonproliferative with macular edema in right eye   Diastolic dysfunction 01/1442    Grade 1.   Essential hypertension, benign    Heart murmur    in past   HOH (hard of hearing)    Hyperkalemia    Kidney stones    Mixed hyperlipidemia    NAFLD (nonalcoholic fatty liver disease)    Pancreatic insufficiency    Diagnosed at [facility]   Prostate cancer [facility]) 2011   Shortness of breath dyspnea    occasional - liver pressing on right lung, decreased capacity   Subdural hematoma (HCC)    Type 2 diabetes mellitus ([place])    Wears hearing aid    "crossover" form right to left   Past Surgical History:  Procedure Laterality Date   APPENDECTOMY     AV FISTULA PLACEMENT Left 05/15/2021   Procedure: INSERTION OF LEFT ARM ARTERIOVENOUS (AV) GORE-TEX GRAFT;  Surgeon: Rosetta Posner, MD;  Location: [facility];  Service: Vascular;  Laterality: Left;   Bilateral hip replacement     CATARACT EXTRACTION W/PHACO Left 05/15/2015   Procedure: CATARACT EXTRACTION PHACO AND INTRAOCULAR LENS PLACEMENT ([place]);  Surgeon: Ronnell Freshwater, MD;  Location: [place];  Service: Ophthalmology;  Laterality: Left;  DIABETIC - insulin pump and oral meds   CATARACT EXTRACTION W/PHACO Right 08/28/2015   Procedure: CATARACT EXTRACTION PHACO AND INTRAOCULAR LENS PLACEMENT ([facility]);  Surgeon: Ronnell Freshwater, MD;  Location: [place];  Service: Ophthalmology;  Laterality: Right;  DIABETIC PER PT AND CINDY PLEASE KEEP ARRIVAL TIME AFTER 8AM    CHOLECYSTECTOMY     HERNIA REPAIR     INSERTION OF DIALYSIS CATHETER Right 01/22/2021   Procedure: INSERTION OF DIALYSIS CATHETER;  Surgeon: Virl Cagey, MD;  Location: [facility];  Service: General;  Laterality: Right;   INSERTION OF DIALYSIS CATHETER Right 04/16/2021   Procedure: INSERTION OF DIALYSIS CATHETER;  Surgeon: Virl Cagey, MD;  Location: [facility];  Service: General;  Laterality: Right;   PROSTATECTOMY  2011   Current Outpatient Medications on File Prior to Visit  Medication Sig Dispense Refill   cinacalcet (SENSIPAR) 30 MG  tablet SMARTSIG:1 Tablet(s) By Mouth Every Evening     CREON 12000-38000 units CPEP capsule Take by mouth.     diltiazem (CARDIZEM CD) 240 MG 24 hr capsule TAKE 1 CAPSULE(240 MG) BY MOUTH DAILY 90 capsule 3   HYDROcodone-acetaminophen (NORCO) 7.5-325 MG tablet Take 1 tablet by mouth every 6 (six) hours as needed for severe pain. Must last 30 days. 120 tablet 0   insulin aspart (NOVOLOG) 100 UNIT/ML injection Inject 8 Units into the skin 3 (three) times daily with meals. (Patient taking differently: Inject 6 Units into the  skin in the morning.) 10 mL 11   insulin glargine (LANTUS) 100 UNIT/ML injection Inject 0.12 mLs (12 Units total) into the skin at bedtime. (Patient taking differently: Inject 6 Units into the skin in the morning.) 10 mL 11   lidocaine-prilocaine (EMLA) cream APPLY SMALL AMOUNT TO ACCESS SITE (AVF) 30 MINUTES BEFORE DIALYSIS. COVER WITH OCCLUSIVE DRESSING (SARAN WRAP) 30 g 11   metoprolol tartrate (LOPRESSOR) 25 MG tablet Take 1 tablet (25 mg total) by mouth 2 (two) times daily. TAKE 1 TABLET(50 MG) BY MOUTH TWICE DAILY Strength: 50 mg 60 tablet 3   pravastatin (PRAVACHOL) 10 MG tablet TAKE 1 TABLET(10 MG) BY MOUTH DAILY WITH BREAKFAST 90 tablet 1   No current facility-administered medications on file prior to visit.   Marland Kitchenall Social History   Socioeconomic History   Marital status: Married    Spouse name: Darlene   Number of children: 3   Years of education: Not on file   Highest education level: Not on file  Occupational History   Not on file  Tobacco Use   Smoking status: Never   Smokeless tobacco: Never  Vaping Use   Vaping Use: Never used  Substance and Sexual Activity   Alcohol use: No   Drug use: No   Sexual activity: Yes  Other Topics Concern   Not on file  Social History Narrative   3 children, 1 daughter and 1 son sorry deceased. 1 daughter living in [place] and 1 step child   Married x 13 years 08/25/21.   7 grandchildren   1 great grandchild    Social Determinants of Health   Financial Resource Strain: Low Risk  (08/16/2021)   Overall Financial Resource Strain (CARDIA)    Difficulty of Paying Living Expenses: Not hard at all  Food Insecurity: No Food Insecurity (08/16/2021)   Hunger Vital Sign    Worried About Running Out of Food in the Last Year: Never true    Ran Out of Food in the Last Year: Never true  Transportation Needs: No Transportation Needs (08/16/2021)   PRAPARE - Hydrologist (Medical): No    Lack of Transportation (Non-Medical): No  Physical Activity: Inactive (08/16/2021)   Exercise Vital Sign    Days of Exercise per Week: 0 days    Minutes of Exercise per Session: 0 min  Stress: No Stress Concern Present (08/16/2021)   [place]    Feeling of Stress : Not at all  Social Connections: Moderately Isolated (08/16/2021)   Social Connection and Isolation Panel [NHANES]    Frequency of Communication with Friends and Family: More than three times a week    Frequency of Social Gatherings with Friends and Family: Three times a week    Attends Religious Services: Never    Active Member of Clubs or Organizations: No    Attends Archivist Meetings: Never    Marital Status: Married  Human resources officer Violence: Not At Risk (08/16/2021)   Humiliation, Afraid, Rape, and Kick questionnaire    Fear of Current or Ex-Partner: No    Emotionally Abused: No    Physically Abused: No    Sexually Abused: No      Review of Systems  All other systems reviewed and are negative.      Objective:   Physical Exam Constitutional:      General: He is not in acute distress.    Appearance: Normal appearance. He is normal  weight. He is not ill-appearing or toxic-appearing.  Cardiovascular:     Rate and Rhythm: Normal rate. Rhythm irregular.     Heart sounds: Normal heart sounds.  Pulmonary:     Effort: Pulmonary effort is  normal.     Breath sounds: Rales present.  Abdominal:     General: Abdomen is flat. Bowel sounds are normal.     Palpations: Abdomen is soft.  Musculoskeletal:     Right lower leg: No edema.     Left lower leg: No edema.       Legs:  Skin:    Findings: No erythema or rash.  Neurological:     Mental Status: He is alert.   Diagram shows the location of the pain      Assessment & Plan:   DM type 2 with diabetic peripheral neuropathy ([place])  ESRD (end stage renal disease) ([place])  Calciphylaxis  I believe this is neuropathic pain stemming from his neuropathy especially given the absence of sensation in both feet.  There is no visible sign of calciphylaxis today on exam.  Although he has peripheral vascular disease, the symptoms do not sound like claudication.  Therefore I recommended treatment Neuropathy.  He also reports insomnia and having trouble sleeping.  In the past I tried him on gabapentin for neuropathy but this left him feeling "drunk and dizzy".  He has never tried Lyrica.  He has never tried amitriptyline.  I discussed with him trying amitriptyline.  He can take this at night.  This may help him sleep and also treat the neuropathy in his feet to help minimize the pain.  He would like to try this first.  Therefore I will start him on amitriptyline 25 mg p.o. nightly and then reassess in 1 week.  Uptitrate as tolerated.  If he is unable to sleep with this and if it does not help with pain, we could try Valium 2 mg at night however he still having to take hydrocodone twice a day swallowing to avoid another addictive medication if possible.

## 2022-05-27 DIAGNOSIS — N186 End stage renal disease: Secondary | ICD-10-CM | POA: Diagnosis not present

## 2022-05-27 DIAGNOSIS — D631 Anemia in chronic kidney disease: Secondary | ICD-10-CM | POA: Diagnosis not present

## 2022-05-27 DIAGNOSIS — N2581 Secondary hyperparathyroidism of renal origin: Secondary | ICD-10-CM | POA: Diagnosis not present

## 2022-05-27 DIAGNOSIS — E876 Hypokalemia: Secondary | ICD-10-CM | POA: Diagnosis not present

## 2022-05-27 DIAGNOSIS — D509 Iron deficiency anemia, unspecified: Secondary | ICD-10-CM | POA: Diagnosis not present

## 2022-05-27 DIAGNOSIS — Z992 Dependence on renal dialysis: Secondary | ICD-10-CM | POA: Diagnosis not present

## 2022-05-27 DIAGNOSIS — D689 Coagulation defect, unspecified: Secondary | ICD-10-CM | POA: Diagnosis not present

## 2022-05-28 ENCOUNTER — Other Ambulatory Visit: Payer: Self-pay | Admitting: Family Medicine

## 2022-05-29 NOTE — Telephone Encounter (Signed)
Last OV 05/23/22, will refill medication,  Requested Prescriptions  Pending Prescriptions Disp Refills  . pravastatin (PRAVACHOL) 10 MG tablet [Pharmacy Med Name: PRAVASTATIN 10MG TABLETS] 90 tablet 1    Sig: TAKE 1 TABLET(10 MG) BY MOUTH DAILY WITH BREAKFAST     Cardiovascular:  Antilipid - Statins Failed - 05/28/2022 10:10 PM      Failed - Valid encounter within last 12 months    Recent Outpatient Visits          1 year ago Need for immunization against influenza   [place] [name], MD   1 year ago Acute renal failure superimposed on stage 4 chronic kidney disease, unspecified acute renal failure type ([place])   [place] [name], MD   1 year ago Calciphylaxis of lower extremity with nonhealing ulcer, limited to breakdown of skin ([place])   [place] Pickard, Cammie Mcgee, MD   1 year ago COVID-19   [place] Pickard, Cammie Mcgee, MD   1 year ago [place] Dennard Schaumann, Cammie Mcgee, MD             Failed - Lipid Panel in normal range within the last 12 months    Cholesterol  Date Value Ref Range Status  07/20/2019 103 <200 mg/dL Final   LDL Cholesterol (Calc)  Date Value Ref Range Status  07/20/2019 49 mg/dL (calc) Final    Comment:    Reference range: <100 . Desirable range <100 mg/dL for primary prevention;   <70 mg/dL for patients with CHD or diabetic patients  with > or = 2 CHD risk factors. Marland Kitchen LDL-C is now calculated using the Martin-Hopkins  calculation, which is a validated novel method providing  better accuracy than the Friedewald equation in the  estimation of LDL-C.  Cresenciano Genre et al. Annamaria Helling. 9528;413(24): 2061-2068  (http://education.[facility].com/faq/FAQ164)    Direct LDL  Date Value Ref Range Status  05/27/2016 54 <130 mg/dL Final    Comment:      Desirable range <100 mg/dL for patients with CHD or diabetes and <70 mg/dL for diabetic  patients with known heart disease.      HDL  Date Value Ref Range Status  07/20/2019 38 (L) > OR = 40 mg/dL Final   Triglycerides  Date Value Ref Range Status  07/20/2019 81 <150 mg/dL Final         Passed - Patient is not pregnant

## 2022-06-04 DIAGNOSIS — E1122 Type 2 diabetes mellitus with diabetic chronic kidney disease: Secondary | ICD-10-CM | POA: Diagnosis not present

## 2022-06-04 DIAGNOSIS — Z992 Dependence on renal dialysis: Secondary | ICD-10-CM | POA: Diagnosis not present

## 2022-06-04 DIAGNOSIS — N186 End stage renal disease: Secondary | ICD-10-CM | POA: Diagnosis not present

## 2022-06-05 ENCOUNTER — Other Ambulatory Visit: Payer: Self-pay | Admitting: Family Medicine

## 2022-06-05 ENCOUNTER — Encounter: Payer: Self-pay | Admitting: Family Medicine

## 2022-06-05 DIAGNOSIS — D689 Coagulation defect, unspecified: Secondary | ICD-10-CM | POA: Diagnosis not present

## 2022-06-05 DIAGNOSIS — N186 End stage renal disease: Secondary | ICD-10-CM | POA: Diagnosis not present

## 2022-06-05 DIAGNOSIS — Z992 Dependence on renal dialysis: Secondary | ICD-10-CM | POA: Diagnosis not present

## 2022-06-05 DIAGNOSIS — E876 Hypokalemia: Secondary | ICD-10-CM | POA: Diagnosis not present

## 2022-06-05 DIAGNOSIS — N2581 Secondary hyperparathyroidism of renal origin: Secondary | ICD-10-CM | POA: Diagnosis not present

## 2022-06-05 MED ORDER — DIAZEPAM 2 MG PO TABS: 2.0000 mg | ORAL_TABLET | Freq: Every evening | ORAL | 0 refills | Status: DC | PRN

## 2022-06-06 NOTE — Telephone Encounter (Signed)
Called spoke w/pt regarding providers recommendations. Pt voiced understanding and aware Rx has been sent to pharmacy.   Nothing needed further.

## 2022-06-10 DIAGNOSIS — E876 Hypokalemia: Secondary | ICD-10-CM | POA: Diagnosis not present

## 2022-06-10 DIAGNOSIS — N2581 Secondary hyperparathyroidism of renal origin: Secondary | ICD-10-CM | POA: Diagnosis not present

## 2022-06-10 DIAGNOSIS — N186 End stage renal disease: Secondary | ICD-10-CM | POA: Diagnosis not present

## 2022-06-10 DIAGNOSIS — D689 Coagulation defect, unspecified: Secondary | ICD-10-CM | POA: Diagnosis not present

## 2022-06-10 DIAGNOSIS — D509 Iron deficiency anemia, unspecified: Secondary | ICD-10-CM | POA: Diagnosis not present

## 2022-06-10 DIAGNOSIS — Z992 Dependence on renal dialysis: Secondary | ICD-10-CM | POA: Diagnosis not present

## 2022-06-17 DIAGNOSIS — N2581 Secondary hyperparathyroidism of renal origin: Secondary | ICD-10-CM | POA: Diagnosis not present

## 2022-06-17 DIAGNOSIS — E039 Hypothyroidism, unspecified: Secondary | ICD-10-CM | POA: Diagnosis not present

## 2022-06-17 DIAGNOSIS — D689 Coagulation defect, unspecified: Secondary | ICD-10-CM | POA: Diagnosis not present

## 2022-06-17 DIAGNOSIS — E1122 Type 2 diabetes mellitus with diabetic chronic kidney disease: Secondary | ICD-10-CM | POA: Diagnosis not present

## 2022-06-17 DIAGNOSIS — N186 End stage renal disease: Secondary | ICD-10-CM | POA: Diagnosis not present

## 2022-06-17 DIAGNOSIS — Z992 Dependence on renal dialysis: Secondary | ICD-10-CM | POA: Diagnosis not present

## 2022-06-17 DIAGNOSIS — D509 Iron deficiency anemia, unspecified: Secondary | ICD-10-CM | POA: Diagnosis not present

## 2022-06-18 ENCOUNTER — Ambulatory Visit
Payer: PPO | Attending: Student in an Organized Health Care Education/Training Program | Admitting: Student in an Organized Health Care Education/Training Program

## 2022-06-18 ENCOUNTER — Encounter: Payer: Self-pay | Admitting: Student in an Organized Health Care Education/Training Program

## 2022-06-18 VITALS — BP 140/82 | HR 84 | Temp 97.2°F | Ht 68.0 in | Wt 161.0 lb

## 2022-06-18 DIAGNOSIS — N186 End stage renal disease: Secondary | ICD-10-CM | POA: Insufficient documentation

## 2022-06-18 DIAGNOSIS — E1142 Type 2 diabetes mellitus with diabetic polyneuropathy: Secondary | ICD-10-CM | POA: Insufficient documentation

## 2022-06-18 DIAGNOSIS — G894 Chronic pain syndrome: Secondary | ICD-10-CM | POA: Insufficient documentation

## 2022-06-18 DIAGNOSIS — Z794 Long term (current) use of insulin: Secondary | ICD-10-CM | POA: Diagnosis not present

## 2022-06-18 DIAGNOSIS — L97923 Non-pressure chronic ulcer of unspecified part of left lower leg with necrosis of muscle: Secondary | ICD-10-CM | POA: Diagnosis not present

## 2022-06-18 DIAGNOSIS — Z79891 Long term (current) use of opiate analgesic: Secondary | ICD-10-CM | POA: Diagnosis not present

## 2022-06-18 MED ORDER — HYDROCODONE-ACETAMINOPHEN 5-325 MG PO TABS: 1.0000 | ORAL_TABLET | Freq: Two times a day (BID) | ORAL | 0 refills | Status: AC | PRN

## 2022-06-18 MED ORDER — HYDROCODONE-ACETAMINOPHEN 5-325 MG PO TABS: 1.0000 | ORAL_TABLET | Freq: Three times a day (TID) | ORAL | 0 refills | Status: AC | PRN

## 2022-06-18 NOTE — Progress Notes (Signed)
Nursing Pain Medication Assessment:  Safety precautions to be maintained throughout the outpatient stay will include: orient to surroundings, keep bed in low position, maintain call bell within reach at all times, provide assistance with transfer out of bed and ambulation.  Medication Inspection Compliance: Pill count conducted under aseptic conditions, in front of the patient. Neither the pills nor the bottle was removed from the patient's sight at any time. Once count was completed pills were immediately returned to the patient in their original bottle.  Medication: Hydrocodone/APAP Pill/Patch Count:  95 of 120 pills remain Pill/Patch Appearance: Markings consistent with prescribed medication Bottle Appearance: Standard pharmacy container. Clearly labeled. Filled Date: 101 / 06 / 2023 Last Medication intake:  YesterdaySafety precautions to be maintained throughout the outpatient stay will include: orient to surroundings, keep bed in low position, maintain call bell within reach at all times, provide assistance with transfer out of bed and ambulation.

## 2022-06-18 NOTE — Progress Notes (Signed)
PROVIDER NOTE: Information contained herein reflects review and annotations entered in association with encounter. Interpretation of such information and data should be left to medically-trained personnel. Information provided to patient can be located elsewhere in the medical record under "Patient Instructions". Document created using STT-dictation technology, any transcriptional errors that may result from process are unintentional.    Patient: Mark Jumbo Sr.  Service Category: E/M  Provider: [name], MD  DOB: Feb 21, 1941  DOS: 06/18/2022  Specialty: Interventional Pain Management  MRN: [id number]  Setting: Ambulatory outpatient  PCP: [name], MD  Type: Established Patient    Referring Provider: [name], MD  Location: Office  Delivery: Face-to-face     HPI  [name], a 81 y.o. year old male, is here today because of his Chronic pain syndrome [G89.4]. [name] primary complain today is no pain today (No pain today) Last encounter: My last encounter with him was on 01/31/22 Pertinent problems: [name] has End stage renal disease ([place]); Pain management contract signed; Calciphylaxis of left lower extremity with nonhealing ulcer with necrosis of muscle ([place]); and Chronic pain syndrome on their pertinent problem list. Pain Assessment: Severity of Chronic pain is reported as a 0-No pain/10. Location: Leg (right) Right/Denies. Onset: 1 to 4 weeks ago. Quality:  (no pain). Timing: Intermittent. Modifying factor(s): Meds and laying. Vitals:  height is _0  (1.727 m) and weight is 161 lb (73 kg). His temperature is 97.2 F (36.2 C) (abnormal). His blood pressure is 140/82 (abnormal) and his pulse is 84. His oxygen saturation is 100%.   Reason for encounter: medication management.    Braxen presents today for medication management. He continues to participate in dialysis. Patient's overall has improved although he continues to endorse persistent and  severe lower extremity neuropathic pain.  We have effectively weaned his hydrocodone down from 10 mg every 6 hours as needed to 5 mg every 6 hours which we will continue to wean further to 5 mg 3 times daily as needed.  He states that he usually takes hydrocodone every 8-12 hours as needed tomorrow for chronic severe pain regimen acute intermittent episodes of excruciating pain that he has in his life.  He continues monitor his bowels and states that he does not have any constipation  Pharmacotherapy Assessment  Analgesic: Hydrocodone 5 mg every 8 hours as needed, #90/month MME= 15   Monitoring: [place] PMP: PDMP reviewed during this encounter.       Pharmacotherapy: No side-effects or adverse reactions reported. Compliance: No problems identified. Effectiveness: Clinically acceptable.  Mark Fischer, RN  06/18/2022 12:39 PM  Sign when Signing Visit Nursing Pain Medication Assessment:  Safety precautions to be maintained throughout the outpatient stay will include: orient to surroundings, keep bed in low position, maintain call bell within reach at all times, provide assistance with transfer out of bed and ambulation.  Medication Inspection Compliance: Pill count conducted under aseptic conditions, in front of the patient. Neither the pills nor the bottle was removed from the patient's sight at any time. Once count was completed pills were immediately returned to the patient in their original bottle.  Medication: Hydrocodone/APAP Pill/Patch Count:  95 of 120 pills remain Pill/Patch Appearance: Markings consistent with prescribed medication Bottle Appearance: Standard pharmacy container. Clearly labeled. Filled Date: 35 / 06 / 2023 Last Medication intake:  YesterdaySafety precautions to be maintained throughout the outpatient stay will include: orient to surroundings, keep bed in low position, maintain call bell within  reach at all times, provide assistance with transfer out of bed and ambulation.       UDS:  Summary  Date Value Ref Range Status  09/06/2021 Note  Final    Comment:    ==================================================================== [employer] (MW) ==================================================================== Test                             Result       Flag       Units  Drug Present and Declared for Prescription Verification   Hydrocodone                    >92119       EXPECTED   ng/mg creat   Hydromorphone                  1019         EXPECTED   ng/mg creat   Dihydrocodeine                 1089         EXPECTED   ng/mg creat   Norhydrocodone                 7886         EXPECTED   ng/mg creat    Sources of hydrocodone include scheduled prescription medications.    Hydromorphone, dihydrocodeine and norhydrocodone are expected    metabolites of hydrocodone. Hydromorphone and dihydrocodeine are    also available as scheduled prescription medications.  Drug Present not Declared for Prescription Verification   Desmethyldiazepam              40           UNEXPECTED ng/mg creat   Oxazepam                       194          UNEXPECTED ng/mg creat    Desmethyldiazepam and oxazepam are benzodiazepine drugs, but may    also be present as common metabolites of other benzodiazepine drugs,    including diazepam, chlordiazepoxide, prazepam, clorazepate, and    halazepam.  ==================================================================== Test                      Result    Flag   Units      Ref Range   Creatinine              63               mg/dL      >=20 ==================================================================== Declared Medications:  The flagging and interpretation on this report are based on the  following declared medications.  Unexpected results may arise from  inaccuracies in the declared medications.   **Note: The testing scope of this panel includes these medications:   Hydrocodone (Norco)   **Note: The testing scope of  this panel does not include the  following reported medications:   Acetaminophen (Norco)  Aspirin  Calcitriol  Diltiazem  Insulin (NovoLog)  Metoprolol  Pancrelipase  Pravastatin  Sevelamer (Renvela)  Sodium Bicarbonate  Topical ==================================================================== For clinical consultation, please call [phone]. ====================================================================      ROS  Constitutional: Denies any fever or chills Gastrointestinal: No reported hemesis, hematochezia, vomiting, or acute GI distress Musculoskeletal:  Bilateral leg pain Neurological:  Paresthesias  Medication Review  HYDROcodone-acetaminophen, cinacalcet, diazepam, diltiazem,  insulin aspart, insulin glargine, lidocaine-prilocaine, lipase/protease/amylase, metoprolol tartrate, and pravastatin  History Review  Allergy: [name] is allergic to enalapril maleate. Drug: [name]  reports no history of drug use. Alcohol:  reports no history of alcohol use. Tobacco:  reports that he has never smoked. He has never used smokeless tobacco. Social: [name]  reports that he has never smoked. He has never used smokeless tobacco. He reports that he does not drink alcohol and does not use drugs. Medical:  has a past medical history of Arthritis, Chronic back pain, Chronic kidney disease, Coronary atherosclerosis of native coronary artery, Deafness in right ear, Diabetes mellitus, Diabetic retinopathy ([place]), Diastolic dysfunction (11/345), Essential hypertension, benign, Heart murmur, HOH (hard of hearing), Hyperkalemia, Kidney stones, Mixed hyperlipidemia, NAFLD (nonalcoholic fatty liver disease), Pancreatic insufficiency, Prostate cancer ([place]) (2011), Shortness of breath dyspnea, Subdural hematoma ([place]), Type 2 diabetes mellitus ([place]), and Wears hearing aid. Surgical: [name]  has a past surgical history that includes Prostatectomy (2011); Hernia repair;  Appendectomy; Cholecystectomy; Bilateral hip replacement; Cataract extraction w/PHACO (Left, 05/15/2015); Cataract extraction w/PHACO (Right, 08/28/2015); Insertion of dialysis catheter (Right, 01/22/2021); Insertion of dialysis catheter (Right, 04/16/2021); and AV fistula placement (Left, 05/15/2021). Family: family history includes Diabetes type II in his mother; Heart attack in his father.  Laboratory Chemistry Profile   Renal Lab Results  Component Value Date   BUN 39 (H) 05/15/2021   CREATININE 3.00 (H) 05/15/2021   LABCREA 35.88 01/16/2021   BCR 19 04/12/2021   GFR 65.09 01/29/2017   GFRAA 42 (L) 12/30/2019   GFRNONAA 18 (L) 04/18/2021   GFRNONAA 17 (L) 04/18/2021    Hepatic Lab Results  Component Value Date   AST 16 04/18/2021   ALT 9 04/18/2021   ALBUMIN 2.0 (L) 04/18/2021   ALBUMIN 2.0 (L) 04/18/2021   ALKPHOS 143 (H) 04/18/2021   LIPASE 21 12/23/2020   AMMONIA 50 12/23/2019    Electrolytes Lab Results  Component Value Date   NA 136 05/15/2021   K 4.2 05/15/2021   CL 99 05/15/2021   CALCIUM 7.3 (L) 04/18/2021   CALCIUM 7.1 (L) 04/18/2021   MG 1.7 02/06/2021   PHOS 6.0 (H) 04/18/2021    Bone Lab Results  Component Value Date   VD25OH 25.27 (L) 12/28/2020   TESTOFREE 43.1 (L) 02/28/2015   TESTOSTERONE 295 11/25/2017    Inflammation (CRP: Acute Phase) (ESR: Chronic Phase) Lab Results  Component Value Date   CRP 14.5 (H) 01/09/2021   ESRSEDRATE 11 12/02/2018   LATICACIDVEN 1.2 04/13/2021         Note: Above Lab results reviewed.  Recent Imaging Review  VAS [place] ABI WITH/WO TBI  LOWER EXTREMITY DOPPLER STUDY  Patient Name:  Mark GOTO Mosley SR.  Date of Exam:   06/06/2021 Medical Rec #: [id number]             Accession #:    [id number] Date of Birth: Jul 06, 1941             Patient Gender: M Patient Age:   64 years Exam Location:  [facility] Procedure:      VAS [place] ABI WITH/WO TBI Referring Phys: Jenna Luo  --------------------------------------------------------------------------------   Indications: Left leg pain.  High Risk Factors: Diabetes.  Other Factors: Calciphylaxis.  Performing Technologist: Ralene Cork RVT    Examination Guidelines: A complete evaluation includes at minimum, Doppler waveform signals and systolic blood pressure reading at the level of bilateral brachial, anterior tibial, and posterior tibial  arteries, when vessel segments are accessible. Bilateral testing is considered an integral part of a complete examination. Photoelectric Plethysmograph (PPG) waveforms and toe systolic pressure readings are included as required and additional duplex testing as needed. Limited examinations for reoccurring indications may be performed as noted.    ABI Findings: +---------+------------------+-----+----------+--------+ Right    Rt Pressure (mmHg)IndexWaveform  Comment  +---------+------------------+-----+----------+--------+ Brachial 130                                       +---------+------------------+-----+----------+--------+ PTA      255               1.96 biphasic           +---------+------------------+-----+----------+--------+ DP       255               1.96 monophasic         +---------+------------------+-----+----------+--------+ Great Toe255               1.96                    +---------+------------------+-----+----------+--------+  +---------+------------------+-----+--------+-------+ Left     Lt Pressure (mmHg)IndexWaveformComment +---------+------------------+-----+--------+-------+ PTA      255               1.96 biphasic        +---------+------------------+-----+--------+-------+ DP       255               1.96 biphasic        +---------+------------------+-----+--------+-------+ Great Toe253               1.95                  +---------+------------------+-----+--------+-------+  +-------+-----------+-----------+------------+------------+ ABI/TBIToday's ABIToday's TBIPrevious ABIPrevious TBI +-------+-----------+-----------+------------+------------+ Right  [place]         [place]                                  +-------+-----------+-----------+------------+------------+ Left   [place]         [place]                                  +-------+-----------+-----------+------------+------------+     No previous ABI.   Summary: Right: Resting right ankle-brachial index indicates noncompressible right lower extremity arteries. The right toe-brachial index is abnormal.  Left: Resting left ankle-brachial index indicates noncompressible left lower extremity arteries. The left toe-brachial index is abnormal.    *See table(s) above for measurements and observations.    Electronically signed by Curt Jews MD on 06/06/2021 at 3:02:04 PM.       Final   Note: Reviewed        Physical Exam  General appearance: Well nourished, well developed, and well hydrated. In no apparent acute distress Mental status: Alert, oriented x 3 (person, place, & time)       Respiratory: No evidence of acute respiratory distress Eyes: PERLA Vitals: BP (!) 140/82   Pulse 84   Temp (!) 97.2 F (36.2 C)   Ht _0  (1.727 m)   Wt 161 lb (73 kg)   SpO2 100%   BMI 24.48 kg/m  BMI: Estimated body mass index is 24.48 kg/m as calculated from the following:  Height as of this encounter: _0  (1.727 m).   Weight as of this encounter: 161 lb (73 kg). Ideal: Ideal body weight: 68.4 kg (150 lb 12.7 oz) Adjusted ideal body weight: 70.3 kg (154 lb 14 oz)    Assessment   Diagnosis Status  1. Chronic pain syndrome   2. Calciphylaxis of left lower extremity with nonhealing ulcer with necrosis of muscle ([place])   3. End stage renal disease ([place])   4. Type 2 diabetes mellitus with diabetic polyneuropathy, with long-term current use of  insulin ([place])   5. Encounter for long-term opiate analgesic use     Controlled Controlled Controlled   Plan of Care    Mr. MERIC JOYE Sr. has a current medication list which includes the following long-term medication(s): diltiazem, insulin aspart, insulin glargine, metoprolol tartrate, and pravastatin.  Wean hydrocodone as below  Pharmacotherapy (Medications Ordered): Meds ordered this encounter  Medications   HYDROcodone-acetaminophen (NORCO/VICODIN) 5-325 MG tablet    Sig: Take 1 tablet by mouth every 8 (eight) hours as needed for severe pain. Must last 30 days.    Dispense:  90 tablet    Refill:  0    Chronic Pain: STOP Act (Not applicable) Fill 1 day early if closed on refill date. Avoid benzodiazepines within 8 hours of opioids   HYDROcodone-acetaminophen (NORCO/VICODIN) 5-325 MG tablet    Sig: Take 1 tablet by mouth every 12 (twelve) hours as needed for severe pain. Must last 30 days.    Dispense:  60 tablet    Refill:  0    Chronic Pain: STOP Act (Not applicable) Fill 1 day early if closed on refill date. Avoid benzodiazepines within 8 hours of opioids   HYDROcodone-acetaminophen (NORCO/VICODIN) 5-325 MG tablet    Sig: Take 1 tablet by mouth every 12 (twelve) hours as needed for severe pain. Must last 30 days.    Dispense:  60 tablet    Refill:  0    Chronic Pain: STOP Act (Not applicable) Fill 1 day early if closed on refill date. Avoid benzodiazepines within 8 hours of opioids    Follow-up plan:   Return in about 14 weeks (around 09/24/2022) for Medication Management, in person.    Recent Visits Date Type Provider Dept  04/30/22 Office Visit [name], MD [facility]  Showing recent visits within past 90 days and meeting all other requirements Today's Visits Date Type Provider Dept  06/18/22 Office Visit [name], MD [facility]  Showing today's visits and meeting all other requirements Future Appointments No visits were found  meeting these conditions. Showing future appointments within next 90 days and meeting all other requirements  I discussed the assessment and treatment plan with the patient. The patient was provided an opportunity to ask questions and all were answered. The patient agreed with the plan and demonstrated an understanding of the instructions.  Patient advised to call back or seek an in-person evaluation if the symptoms or condition worsens.  Duration of encounter: 27mnutes.  Note by: BGillis Santa MD Date: 06/18/2022; Time: 1:22 PM

## 2022-06-23 DIAGNOSIS — N186 End stage renal disease: Secondary | ICD-10-CM | POA: Diagnosis not present

## 2022-06-23 DIAGNOSIS — Z992 Dependence on renal dialysis: Secondary | ICD-10-CM | POA: Diagnosis not present

## 2022-06-23 DIAGNOSIS — D689 Coagulation defect, unspecified: Secondary | ICD-10-CM | POA: Diagnosis not present

## 2022-06-23 DIAGNOSIS — E876 Hypokalemia: Secondary | ICD-10-CM | POA: Diagnosis not present

## 2022-06-23 DIAGNOSIS — N2581 Secondary hyperparathyroidism of renal origin: Secondary | ICD-10-CM | POA: Diagnosis not present

## 2022-06-23 DIAGNOSIS — D509 Iron deficiency anemia, unspecified: Secondary | ICD-10-CM | POA: Diagnosis not present

## 2022-06-28 ENCOUNTER — Other Ambulatory Visit: Payer: Self-pay | Admitting: Family Medicine

## 2022-06-30 ENCOUNTER — Encounter: Payer: Self-pay | Admitting: Family Medicine

## 2022-07-01 ENCOUNTER — Other Ambulatory Visit: Payer: Self-pay | Admitting: Family Medicine

## 2022-07-01 DIAGNOSIS — D689 Coagulation defect, unspecified: Secondary | ICD-10-CM | POA: Diagnosis not present

## 2022-07-01 DIAGNOSIS — Z992 Dependence on renal dialysis: Secondary | ICD-10-CM | POA: Diagnosis not present

## 2022-07-01 DIAGNOSIS — D509 Iron deficiency anemia, unspecified: Secondary | ICD-10-CM | POA: Diagnosis not present

## 2022-07-01 DIAGNOSIS — N186 End stage renal disease: Secondary | ICD-10-CM | POA: Diagnosis not present

## 2022-07-01 DIAGNOSIS — E876 Hypokalemia: Secondary | ICD-10-CM | POA: Diagnosis not present

## 2022-07-01 DIAGNOSIS — N2581 Secondary hyperparathyroidism of renal origin: Secondary | ICD-10-CM | POA: Diagnosis not present

## 2022-07-01 MED ORDER — CREON 12000-38000 UNITS PO CPEP: 12000.0000 [IU] | ORAL_CAPSULE | Freq: Three times a day (TID) | ORAL | 3 refills | Status: DC

## 2022-07-01 NOTE — Telephone Encounter (Signed)
Unable to refill per protocol, Rx expired.Medication was discontinued 06/05/22 by PCP. Will refuse.  Requested Prescriptions  Pending Prescriptions Disp Refills   amitriptyline (ELAVIL) 25 MG tablet [Pharmacy Med Name: AMITRIPTYLINE 25MG TABLETS] 30 tablet 0    Sig: TAKE 1 TABLET(25 MG) BY MOUTH AT BEDTIME     Psychiatry:  Antidepressants - Heterocyclics (TCAs) Failed - 06/28/2022  9:16 AM      Failed - Valid encounter within last 6 months    Recent Outpatient Visits           1 year ago Need for immunization against influenza   [place] [name], MD   1 year ago Acute renal failure superimposed on stage 4 chronic kidney disease, unspecified acute renal failure type ([place])   [place] [name], MD   1 year ago Calciphylaxis of lower extremity with nonhealing ulcer, limited to breakdown of skin ([place])   [place] Pickard, Cammie Mcgee, MD   1 year ago COVID-19   [place] Pickard, Cammie Mcgee, MD   1 year ago [place] Pickard, Cammie Mcgee, MD

## 2022-07-02 ENCOUNTER — Other Ambulatory Visit: Payer: Self-pay | Admitting: Family Medicine

## 2022-07-04 DIAGNOSIS — Z08 Encounter for follow-up examination after completed treatment for malignant neoplasm: Secondary | ICD-10-CM | POA: Diagnosis not present

## 2022-07-04 DIAGNOSIS — N186 End stage renal disease: Secondary | ICD-10-CM | POA: Diagnosis not present

## 2022-07-04 DIAGNOSIS — X32XXXD Exposure to sunlight, subsequent encounter: Secondary | ICD-10-CM | POA: Diagnosis not present

## 2022-07-04 DIAGNOSIS — Z992 Dependence on renal dialysis: Secondary | ICD-10-CM | POA: Diagnosis not present

## 2022-07-04 DIAGNOSIS — L570 Actinic keratosis: Secondary | ICD-10-CM | POA: Diagnosis not present

## 2022-07-04 DIAGNOSIS — D225 Melanocytic nevi of trunk: Secondary | ICD-10-CM | POA: Diagnosis not present

## 2022-07-04 DIAGNOSIS — E1122 Type 2 diabetes mellitus with diabetic chronic kidney disease: Secondary | ICD-10-CM | POA: Diagnosis not present

## 2022-07-04 DIAGNOSIS — Z85828 Personal history of other malignant neoplasm of skin: Secondary | ICD-10-CM | POA: Diagnosis not present

## 2022-07-05 DIAGNOSIS — N186 End stage renal disease: Secondary | ICD-10-CM | POA: Diagnosis not present

## 2022-07-05 DIAGNOSIS — Z992 Dependence on renal dialysis: Secondary | ICD-10-CM | POA: Diagnosis not present

## 2022-07-05 DIAGNOSIS — N2581 Secondary hyperparathyroidism of renal origin: Secondary | ICD-10-CM | POA: Diagnosis not present

## 2022-07-05 DIAGNOSIS — D689 Coagulation defect, unspecified: Secondary | ICD-10-CM | POA: Diagnosis not present

## 2022-07-08 DIAGNOSIS — N2581 Secondary hyperparathyroidism of renal origin: Secondary | ICD-10-CM | POA: Diagnosis not present

## 2022-07-08 DIAGNOSIS — D509 Iron deficiency anemia, unspecified: Secondary | ICD-10-CM | POA: Diagnosis not present

## 2022-07-08 DIAGNOSIS — N186 End stage renal disease: Secondary | ICD-10-CM | POA: Diagnosis not present

## 2022-07-08 DIAGNOSIS — D689 Coagulation defect, unspecified: Secondary | ICD-10-CM | POA: Diagnosis not present

## 2022-07-08 DIAGNOSIS — D631 Anemia in chronic kidney disease: Secondary | ICD-10-CM | POA: Diagnosis not present

## 2022-07-08 DIAGNOSIS — Z992 Dependence on renal dialysis: Secondary | ICD-10-CM | POA: Diagnosis not present

## 2022-07-08 DIAGNOSIS — E876 Hypokalemia: Secondary | ICD-10-CM | POA: Diagnosis not present

## 2022-07-09 DIAGNOSIS — Z992 Dependence on renal dialysis: Secondary | ICD-10-CM | POA: Diagnosis not present

## 2022-07-09 DIAGNOSIS — N186 End stage renal disease: Secondary | ICD-10-CM | POA: Diagnosis not present

## 2022-07-09 DIAGNOSIS — T82858A Stenosis of vascular prosthetic devices, implants and grafts, initial encounter: Secondary | ICD-10-CM | POA: Diagnosis not present

## 2022-07-09 DIAGNOSIS — I871 Compression of vein: Secondary | ICD-10-CM | POA: Diagnosis not present

## 2022-07-15 DIAGNOSIS — N186 End stage renal disease: Secondary | ICD-10-CM | POA: Diagnosis not present

## 2022-07-15 DIAGNOSIS — Z992 Dependence on renal dialysis: Secondary | ICD-10-CM | POA: Diagnosis not present

## 2022-07-15 DIAGNOSIS — D509 Iron deficiency anemia, unspecified: Secondary | ICD-10-CM | POA: Diagnosis not present

## 2022-07-15 DIAGNOSIS — E876 Hypokalemia: Secondary | ICD-10-CM | POA: Diagnosis not present

## 2022-07-15 DIAGNOSIS — N2581 Secondary hyperparathyroidism of renal origin: Secondary | ICD-10-CM | POA: Diagnosis not present

## 2022-07-15 DIAGNOSIS — D689 Coagulation defect, unspecified: Secondary | ICD-10-CM | POA: Diagnosis not present

## 2022-07-22 DIAGNOSIS — E1122 Type 2 diabetes mellitus with diabetic chronic kidney disease: Secondary | ICD-10-CM | POA: Diagnosis not present

## 2022-07-22 DIAGNOSIS — Z992 Dependence on renal dialysis: Secondary | ICD-10-CM | POA: Diagnosis not present

## 2022-07-22 DIAGNOSIS — N2581 Secondary hyperparathyroidism of renal origin: Secondary | ICD-10-CM | POA: Diagnosis not present

## 2022-07-22 DIAGNOSIS — N186 End stage renal disease: Secondary | ICD-10-CM | POA: Diagnosis not present

## 2022-07-22 DIAGNOSIS — D689 Coagulation defect, unspecified: Secondary | ICD-10-CM | POA: Diagnosis not present

## 2022-07-22 DIAGNOSIS — D509 Iron deficiency anemia, unspecified: Secondary | ICD-10-CM | POA: Diagnosis not present

## 2022-07-23 ENCOUNTER — Ambulatory Visit: Payer: PPO | Admitting: Family Medicine

## 2022-07-23 DIAGNOSIS — E113293 Type 2 diabetes mellitus with mild nonproliferative diabetic retinopathy without macular edema, bilateral: Secondary | ICD-10-CM | POA: Diagnosis not present

## 2022-07-23 DIAGNOSIS — E1142 Type 2 diabetes mellitus with diabetic polyneuropathy: Secondary | ICD-10-CM | POA: Diagnosis not present

## 2022-07-23 DIAGNOSIS — Z794 Long term (current) use of insulin: Secondary | ICD-10-CM | POA: Diagnosis not present

## 2022-07-23 DIAGNOSIS — I10 Essential (primary) hypertension: Secondary | ICD-10-CM | POA: Diagnosis not present

## 2022-07-23 DIAGNOSIS — N186 End stage renal disease: Secondary | ICD-10-CM | POA: Diagnosis not present

## 2022-07-23 DIAGNOSIS — E1159 Type 2 diabetes mellitus with other circulatory complications: Secondary | ICD-10-CM | POA: Diagnosis not present

## 2022-07-23 DIAGNOSIS — E1122 Type 2 diabetes mellitus with diabetic chronic kidney disease: Secondary | ICD-10-CM | POA: Diagnosis not present

## 2022-07-23 DIAGNOSIS — Z992 Dependence on renal dialysis: Secondary | ICD-10-CM | POA: Diagnosis not present

## 2022-07-28 DIAGNOSIS — Z992 Dependence on renal dialysis: Secondary | ICD-10-CM | POA: Diagnosis not present

## 2022-07-28 DIAGNOSIS — E876 Hypokalemia: Secondary | ICD-10-CM | POA: Diagnosis not present

## 2022-07-28 DIAGNOSIS — D689 Coagulation defect, unspecified: Secondary | ICD-10-CM | POA: Diagnosis not present

## 2022-07-28 DIAGNOSIS — N2581 Secondary hyperparathyroidism of renal origin: Secondary | ICD-10-CM | POA: Diagnosis not present

## 2022-07-28 DIAGNOSIS — D509 Iron deficiency anemia, unspecified: Secondary | ICD-10-CM | POA: Diagnosis not present

## 2022-07-28 DIAGNOSIS — N186 End stage renal disease: Secondary | ICD-10-CM | POA: Diagnosis not present

## 2022-08-04 DIAGNOSIS — N2581 Secondary hyperparathyroidism of renal origin: Secondary | ICD-10-CM | POA: Diagnosis not present

## 2022-08-04 DIAGNOSIS — D509 Iron deficiency anemia, unspecified: Secondary | ICD-10-CM | POA: Diagnosis not present

## 2022-08-04 DIAGNOSIS — Z992 Dependence on renal dialysis: Secondary | ICD-10-CM | POA: Diagnosis not present

## 2022-08-04 DIAGNOSIS — N186 End stage renal disease: Secondary | ICD-10-CM | POA: Diagnosis not present

## 2022-08-04 DIAGNOSIS — D689 Coagulation defect, unspecified: Secondary | ICD-10-CM | POA: Diagnosis not present

## 2022-08-04 DIAGNOSIS — E1122 Type 2 diabetes mellitus with diabetic chronic kidney disease: Secondary | ICD-10-CM | POA: Diagnosis not present

## 2022-08-07 DIAGNOSIS — D689 Coagulation defect, unspecified: Secondary | ICD-10-CM | POA: Diagnosis not present

## 2022-08-07 DIAGNOSIS — E876 Hypokalemia: Secondary | ICD-10-CM | POA: Diagnosis not present

## 2022-08-07 DIAGNOSIS — N186 End stage renal disease: Secondary | ICD-10-CM | POA: Diagnosis not present

## 2022-08-07 DIAGNOSIS — N2581 Secondary hyperparathyroidism of renal origin: Secondary | ICD-10-CM | POA: Diagnosis not present

## 2022-08-07 DIAGNOSIS — Z992 Dependence on renal dialysis: Secondary | ICD-10-CM | POA: Diagnosis not present

## 2022-08-07 DIAGNOSIS — D631 Anemia in chronic kidney disease: Secondary | ICD-10-CM | POA: Diagnosis not present

## 2022-08-12 DIAGNOSIS — N186 End stage renal disease: Secondary | ICD-10-CM | POA: Diagnosis not present

## 2022-08-12 DIAGNOSIS — Z992 Dependence on renal dialysis: Secondary | ICD-10-CM | POA: Diagnosis not present

## 2022-08-12 DIAGNOSIS — N2581 Secondary hyperparathyroidism of renal origin: Secondary | ICD-10-CM | POA: Diagnosis not present

## 2022-08-12 DIAGNOSIS — D509 Iron deficiency anemia, unspecified: Secondary | ICD-10-CM | POA: Diagnosis not present

## 2022-08-12 DIAGNOSIS — D689 Coagulation defect, unspecified: Secondary | ICD-10-CM | POA: Diagnosis not present

## 2022-08-12 DIAGNOSIS — E876 Hypokalemia: Secondary | ICD-10-CM | POA: Diagnosis not present

## 2022-08-19 DIAGNOSIS — D689 Coagulation defect, unspecified: Secondary | ICD-10-CM | POA: Diagnosis not present

## 2022-08-19 DIAGNOSIS — D631 Anemia in chronic kidney disease: Secondary | ICD-10-CM | POA: Diagnosis not present

## 2022-08-19 DIAGNOSIS — N186 End stage renal disease: Secondary | ICD-10-CM | POA: Diagnosis not present

## 2022-08-19 DIAGNOSIS — N2581 Secondary hyperparathyroidism of renal origin: Secondary | ICD-10-CM | POA: Diagnosis not present

## 2022-08-19 DIAGNOSIS — D509 Iron deficiency anemia, unspecified: Secondary | ICD-10-CM | POA: Diagnosis not present

## 2022-08-19 DIAGNOSIS — Z992 Dependence on renal dialysis: Secondary | ICD-10-CM | POA: Diagnosis not present

## 2022-08-22 ENCOUNTER — Ambulatory Visit (INDEPENDENT_AMBULATORY_CARE_PROVIDER_SITE_OTHER): Payer: PPO

## 2022-08-22 VITALS — Ht 68.0 in | Wt 164.0 lb

## 2022-08-22 DIAGNOSIS — Z0000 Encounter for general adult medical examination without abnormal findings: Secondary | ICD-10-CM | POA: Diagnosis not present

## 2022-08-22 NOTE — Patient Instructions (Signed)
Mr. Mincey , Thank you for taking time to come for your Medicare Wellness Visit. I appreciate your ongoing commitment to your health goals. Please review the following plan we discussed and let me know if I can assist you in the future.   These are the goals we discussed:  Goals      Exercise 3x per week (30 min per time)     Increase as tolerated.         This is a list of the screening recommended for you and due dates:  Health Maintenance  Topic Date Due   Zoster (Shingles) Vaccine (1 of 2) Never done   DTaP/Tdap/Td vaccine (2 - Td or Tdap) 08/05/2020   Complete foot exam   01/06/2021   Hemoglobin A1C  09/19/2021   COVID-19 Vaccine (5 - 2023-24 season) 06/29/2022   Eye exam for diabetics  12/19/2022   Medicare Annual Wellness Visit  08/23/2023   Pneumonia Vaccine  Completed   Flu Shot  Completed   HPV Vaccine  Aged Out    Advanced directives: We have a copy of your advanced directives available in your record should your provider ever need to access them.   Conditions/risks identified: Aim for 30 minutes of exercise or brisk walking, 6-8 glasses of water, and 5 servings of fruits and vegetables each day.   Next appointment: Follow up in one year for your annual wellness visit.   Preventive Care 18 Years and Older, Male  Preventive care refers to lifestyle choices and visits with your health care provider that can promote health and wellness. What does preventive care include? A yearly physical exam. This is also called an annual well check. Dental exams once or twice a year. Routine eye exams. Ask your health care provider how often you should have your eyes checked. Personal lifestyle choices, including: Daily care of your teeth and gums. Regular physical activity. Eating a healthy diet. Avoiding tobacco and drug use. Limiting alcohol use. Practicing safe sex. Taking low doses of aspirin every day. Taking vitamin and mineral supplements as recommended by your  health care provider. What happens during an annual well check? The services and screenings done by your health care provider during your annual well check will depend on your age, overall health, lifestyle risk factors, and family history of disease. Counseling  Your health care provider may ask you questions about your: Alcohol use. Tobacco use. Drug use. Emotional well-being. Home and relationship well-being. Sexual activity. Eating habits. History of falls. Memory and ability to understand (cognition). Work and work Statistician. Screening  You may have the following tests or measurements: Height, weight, and BMI. Blood pressure. Lipid and cholesterol levels. These may be checked every 5 years, or more frequently if you are over 4 years old. Skin check. Lung cancer screening. You may have this screening every year starting at age 39 if you have a 30-pack-year history of smoking and currently smoke or have quit within the past 15 years. Fecal occult blood test (FOBT) of the stool. You may have this test every year starting at age 28. Flexible sigmoidoscopy or colonoscopy. You may have a sigmoidoscopy every 5 years or a colonoscopy every 10 years starting at age 10. Prostate cancer screening. Recommendations will vary depending on your family history and other risks. Hepatitis C blood test. Hepatitis B blood test. Sexually transmitted disease (STD) testing. Diabetes screening. This is done by checking your blood sugar (glucose) after you have not eaten for a while (fasting).  You may have this done every 1-3 years. Abdominal aortic aneurysm (AAA) screening. You may need this if you are a current or former smoker. Osteoporosis. You may be screened starting at age 47 if you are at high risk. Talk with your health care provider about your test results, treatment options, and if necessary, the need for more tests. Vaccines  Your health care provider may recommend certain vaccines, such  as: Influenza vaccine. This is recommended every year. Tetanus, diphtheria, and acellular pertussis (Tdap, Td) vaccine. You may need a Td booster every 10 years. Zoster vaccine. You may need this after age 61. Pneumococcal 13-valent conjugate (PCV13) vaccine. One dose is recommended after age 21. Pneumococcal polysaccharide (PPSV23) vaccine. One dose is recommended after age 56. Talk to your health care provider about which screenings and vaccines you need and how often you need them. This information is not intended to replace advice given to you by your health care provider. Make sure you discuss any questions you have with your health care provider. Document Released: 08/18/2015 Document Revised: 04/10/2016 Document Reviewed: 05/23/2015 Elsevier Interactive Patient Education  2017 [employer] Prevention in the Home Falls can cause injuries. They can happen to people of all ages. There are many things you can do to make your home safe and to help prevent falls. What can I do on the outside of my home? Regularly fix the edges of walkways and driveways and fix any cracks. Remove anything that might make you trip as you walk through a door, such as a raised step or threshold. Trim any bushes or trees on the path to your home. Use bright outdoor lighting. Clear any walking paths of anything that might make someone trip, such as rocks or tools. Regularly check to see if handrails are loose or broken. Make sure that both sides of any steps have handrails. Any raised decks and porches should have guardrails on the edges. Have any leaves, snow, or ice cleared regularly. Use sand or salt on walking paths during winter. Clean up any spills in your garage right away. This includes oil or grease spills. What can I do in the bathroom? Use night lights. Install grab bars by the toilet and in the tub and shower. Do not use towel bars as grab bars. Use non-skid mats or decals in the tub or  shower. If you need to sit down in the shower, use a plastic, non-slip stool. Keep the floor dry. Clean up any water that spills on the floor as soon as it happens. Remove soap buildup in the tub or shower regularly. Attach bath mats securely with double-sided non-slip rug tape. Do not have throw rugs and other things on the floor that can make you trip. What can I do in the bedroom? Use night lights. Make sure that you have a light by your bed that is easy to reach. Do not use any sheets or blankets that are too big for your bed. They should not hang down onto the floor. Have a firm chair that has side arms. You can use this for support while you get dressed. Do not have throw rugs and other things on the floor that can make you trip. What can I do in the kitchen? Clean up any spills right away. Avoid walking on wet floors. Keep items that you use a lot in easy-to-reach places. If you need to reach something above you, use a strong step stool that has a grab bar. Keep  electrical cords out of the way. Do not use floor polish or wax that makes floors slippery. If you must use wax, use non-skid floor wax. Do not have throw rugs and other things on the floor that can make you trip. What can I do with my stairs? Do not leave any items on the stairs. Make sure that there are handrails on both sides of the stairs and use them. Fix handrails that are broken or loose. Make sure that handrails are as long as the stairways. Check any carpeting to make sure that it is firmly attached to the stairs. Fix any carpet that is loose or worn. Avoid having throw rugs at the top or bottom of the stairs. If you do have throw rugs, attach them to the floor with carpet tape. Make sure that you have a light switch at the top of the stairs and the bottom of the stairs. If you do not have them, ask someone to add them for you. What else can I do to help prevent falls? Wear shoes that: Do not have high heels. Have  rubber bottoms. Are comfortable and fit you well. Are closed at the toe. Do not wear sandals. If you use a stepladder: Make sure that it is fully opened. Do not climb a closed stepladder. Make sure that both sides of the stepladder are locked into place. Ask someone to hold it for you, if possible. Clearly mark and make sure that you can see: Any grab bars or handrails. First and last steps. Where the edge of each step is. Use tools that help you move around (mobility aids) if they are needed. These include: Canes. Walkers. Scooters. Crutches. Turn on the lights when you go into a dark area. Replace any light bulbs as soon as they burn out. Set up your furniture so you have a clear path. Avoid moving your furniture around. If any of your floors are uneven, fix them. If there are any pets around you, be aware of where they are. Review your medicines with your doctor. Some medicines can make you feel dizzy. This can increase your chance of falling. Ask your doctor what other things that you can do to help prevent falls. This information is not intended to replace advice given to you by your health care provider. Make sure you discuss any questions you have with your health care provider. Document Released: 05/18/2009 Document Revised: 12/28/2015 Document Reviewed: 08/26/2014 Elsevier Interactive Patient Education  2017 [employer].

## 2022-08-22 NOTE — Progress Notes (Signed)
Subjective:   Mark L Sjogren Sr. is a 82 y.o. male who presents for Medicare Annual/Subsequent preventive examination.  I connected with  Mark L Wayson Sr. on 08/22/22 by a audio enabled telemedicine application and verified that I am speaking with the correct person using two identifiers.  Patient Location: Home  Provider Location: Office/Clinic  I discussed the limitations of evaluation and management by telemedicine. The patient expressed understanding and agreed to proceed.  Review of Systems     Cardiac Risk Factors include: advanced age (>26mn, >>26women);hypertension;male gender;diabetes mellitus;dyslipidemia;sedentary lifestyle     Objective:    Today's Vitals   08/22/22 1427  Weight: 164 lb (74.4 kg)  Height: '5\' 8"'$  (1.727 m)   Body mass index is 24.94 kg/m.     08/22/2022    2:34 PM 04/30/2022    1:16 PM 01/31/2022    1:27 PM 09/06/2021    1:36 PM 08/16/2021    2:21 PM 05/10/2021    9:17 AM 04/16/2021   12:17 PM  Advanced Directives  Does Patient Have a Medical Advance Directive? Yes Yes Yes Yes No No No  Type of Advance Directive Living will;Healthcare Power of Attorney  Living will      Does patient want to make changes to medical advance directive? No - Patient declined No - Guardian declined  No - Patient declined     Copy of HKerrin Chart? Yes - validated most recent copy scanned in chart (See row information)        Would patient like information on creating a medical advance directive?     No - Patient declined No - Patient declined No - Patient declined    Current Medications (verified) Outpatient Encounter Medications as of 08/22/2022  Medication Sig   cinacalcet (SENSIPAR) 30 MG tablet SMARTSIG:1 Tablet(s) By Mouth Every Evening   CREON 12000-38000 units CPEP capsule Take 1 capsule (12,000 Units total) by mouth 3 (three) times daily before meals. Take before evening meal   diazepam (VALIUM) 2 MG tablet TAKE 1 TABLET(2 MG) BY  MOUTH AT BEDTIME AS NEEDED FOR INSOMNIA   diltiazem (CARDIZEM CD) 240 MG 24 hr capsule TAKE 1 CAPSULE(240 MG) BY MOUTH DAILY   HYDROcodone-acetaminophen (NORCO/VICODIN) 5-325 MG tablet Take 1 tablet by mouth every 12 (twelve) hours as needed for severe pain. Must last 30 days.   insulin aspart (NOVOLOG) 100 UNIT/ML injection Inject 8 Units into the skin 3 (three) times daily with meals. (Patient taking differently: Inject 6 Units into the skin in the morning.)   insulin glargine (LANTUS) 100 UNIT/ML injection Inject 0.12 mLs (12 Units total) into the skin at bedtime. (Patient taking differently: Inject 6 Units into the skin in the morning.)   lidocaine-prilocaine (EMLA) cream APPLY SMALL AMOUNT TO ACCESS SITE (AVF) 30 MINUTES BEFORE DIALYSIS. COVER WITH OCCLUSIVE DRESSING (SARAN WRAP)   metoprolol tartrate (LOPRESSOR) 25 MG tablet Take 1 tablet (25 mg total) by mouth 2 (two) times daily. TAKE 1 TABLET(50 MG) BY MOUTH TWICE DAILY Strength: 50 mg   pravastatin (PRAVACHOL) 10 MG tablet TAKE 1 TABLET(10 MG) BY MOUTH DAILY WITH BREAKFAST   No facility-administered encounter medications on file as of 08/22/2022.    Allergies (verified) Enalapril maleate   History: Past Medical History:  Diagnosis Date   Arthritis    lower back   Chronic back pain    Disc disease   Chronic kidney disease    Coronary atherosclerosis of native coronary artery  Coronary calcifications by chest CT, Myoview demonstrating inferolateral scar   Deafness in right ear    Diabetes mellitus    Diabetic retinopathy (HCC)    moderate nonproliferative with macular edema in right eye   Diastolic dysfunction 61/6073   Grade 1.   Essential hypertension, benign    Heart murmur    in past   HOH (hard of hearing)    Hyperkalemia    Kidney stones    Mixed hyperlipidemia    NAFLD (nonalcoholic fatty liver disease)    Pancreatic insufficiency    Diagnosed at [facility]   Prostate cancer [facility]) 2011   Shortness of breath  dyspnea    occasional - liver pressing on right lung, decreased capacity   Subdural hematoma (HCC)    Type 2 diabetes mellitus ([place])    Wears hearing aid    left   Past Surgical History:  Procedure Laterality Date   APPENDECTOMY     AV FISTULA PLACEMENT Left 05/15/2021   Procedure: INSERTION OF LEFT ARM ARTERIOVENOUS (AV) GORE-TEX GRAFT;  Surgeon: Rosetta Posner, MD;  Location: [facility];  Service: Vascular;  Laterality: Left;   Bilateral hip replacement     CATARACT EXTRACTION W/PHACO Left 05/15/2015   Procedure: CATARACT EXTRACTION PHACO AND INTRAOCULAR LENS PLACEMENT ([place]);  Surgeon: Ronnell Freshwater, MD;  Location: [facility];  Service: Ophthalmology;  Laterality: Left;  DIABETIC - insulin pump and oral meds   CATARACT EXTRACTION W/PHACO Right 08/28/2015   Procedure: CATARACT EXTRACTION PHACO AND INTRAOCULAR LENS PLACEMENT ([facility]);  Surgeon: Ronnell Freshwater, MD;  Location: [facility];  Service: Ophthalmology;  Laterality: Right;  DIABETIC PER PT AND CINDY PLEASE KEEP ARRIVAL TIME AFTER 8AM    CHOLECYSTECTOMY     HERNIA REPAIR     INSERTION OF DIALYSIS CATHETER Right 01/22/2021   Procedure: INSERTION OF DIALYSIS CATHETER;  Surgeon: Virl Cagey, MD;  Location: [facility];  Service: General;  Laterality: Right;   INSERTION OF DIALYSIS CATHETER Right 04/16/2021   Procedure: INSERTION OF DIALYSIS CATHETER;  Surgeon: Virl Cagey, MD;  Location: [facility];  Service: General;  Laterality: Right;   PROSTATECTOMY  2011   Family History  Problem Relation Age of Onset   Diabetes type II Mother    Heart attack Father    Social History   Socioeconomic History   Marital status: Married    Spouse name: Darlene   Number of children: 3   Years of education: Not on file   Highest education level: Not on file  Occupational History   Not on file  Tobacco Use   Smoking status: Never   Smokeless tobacco: Never  Vaping Use   Vaping Use: Never used  Substance  and Sexual Activity   Alcohol use: No   Drug use: No   Sexual activity: Yes  Other Topics Concern   Not on file  Social History Narrative   3 children, 1 daughter and 1 son sorry deceased. 1 daughter living in [place] and 1 step child   Married x 13 years 08/25/21.   7 grandchildren   1 great grandchild   Social Determinants of Health   Financial Resource Strain: Low Risk  (08/22/2022)   Overall Financial Resource Strain (CARDIA)    Difficulty of Paying Living Expenses: Not hard at all  Food Insecurity: No Food Insecurity (08/22/2022)   Hunger Vital Sign    Worried About Running Out of Food in the Last Year: Never true  Ran Out of Food in the Last Year: Never true  Transportation Needs: No Transportation Needs (08/22/2022)   PRAPARE - Hydrologist (Medical): No    Lack of Transportation (Non-Medical): No  Physical Activity: Inactive (08/22/2022)   Exercise Vital Sign    Days of Exercise per Week: 0 days    Minutes of Exercise per Session: 0 min  Stress: No Stress Concern Present (08/22/2022)   [place]    Feeling of Stress : Not at all  Social Connections: Moderately Isolated (08/22/2022)   Social Connection and Isolation Panel [NHANES]    Frequency of Communication with Friends and Family: More than three times a week    Frequency of Social Gatherings with Friends and Family: Three times a week    Attends Religious Services: Never    Active Member of Clubs or Organizations: No    Attends Music therapist: Never    Marital Status: Married    Tobacco Counseling Counseling given: Not Answered   Clinical Intake:  Pre-visit preparation completed: Yes  Pain : No/denies pain  Diabetes: Yes CBG done?: No Did pt. bring in CBG monitor from home?: No  How often do you need to have someone help you when you read instructions, pamphlets, or other written materials from  your doctor or pharmacy?: 1 - Never  Diabetic?Yes Nutrition Risk Assessment:  Has the patient had any N/V/D within the last 2 months?  No  Does the patient have any non-healing wounds?  No  Has the patient had any unintentional weight loss or weight gain?  No   Diabetes:  Is the patient diabetic?  Yes  If diabetic, was a CBG obtained today?  No  Did the patient bring in their glucometer from home?  No  How often do you monitor your CBG's? Libre   [employer] and Diabetes Management:  Are you having any financial strains with the device, your supplies or your medication? No .  Does the patient want to be seen by Chronic Care Management for management of their diabetes?  No  Would the patient like to be referred to a Nutritionist or for Diabetic Management?  No   Diabetic Exams:  Diabetic Eye Exam: Completed 12/18/21 Diabetic Foot Exam: Completed at next office visit    Interpreter Needed?: No  Information entered by :: Denman George LPN   Activities of Daily Living    08/22/2022    2:34 PM  In your present state of health, do you have any difficulty performing the following activities:  Hearing? 1  Vision? 0  Difficulty concentrating or making decisions? 0  Walking or climbing stairs? 1  Dressing or bathing? 0  Doing errands, shopping? 1  Preparing Food and eating ? N  Using the Toilet? N  In the past six months, have you accidently leaked urine? N  Do you have problems with loss of bowel control? N  Managing your Medications? N  Managing your Finances? N  Housekeeping or managing your Housekeeping? N    Patient Care Team: [name], MD as PCP - General (Family Medicine) Satira Sark, MD as PCP - Cardiology (Cardiology) Satira Sark, MD (Cardiology) Edythe Clarity, [facility] as Pharmacist (Pharmacist) [place], [place] (Optometry) [name], MD as Consulting Physician (Pain Medicine) Gabriel Carina Betsey Holiday, MD as Physician Assistant  (Endocrinology) [place], [place] any recent Medical Services you may have  received from other than [facility] providers in the past year (date may be approximate).     Assessment:   This is a routine wellness examination for [name].  Hearing/Vision screen Hearing Screening - Comments:: Hearing difficulties-per patient deaf in right ear and wears hearing aid in left ear  Vision Screening - Comments:: Wears rx glasses - up to date with routine eye exams with [facility]    Dietary issues and exercise activities discussed: Current Exercise Habits: The patient does not participate in regular exercise at present   Goals Addressed             This Visit's Progress    Prevent falls        Depression Screen    08/22/2022    2:32 PM 05/23/2022   11:23 AM 05/02/2022   11:55 AM 01/31/2022    1:27 PM 08/16/2021    2:11 PM 11/02/2020    8:51 AM 01/07/2020    2:59 PM  PHQ 2/9 Scores  PHQ - 2 Score 0 0 2 0 0 0 0  PHQ- 9 Score 6  6    0    Fall Risk    08/22/2022    2:29 PM 06/18/2022   12:43 PM 05/23/2022   11:23 AM 04/30/2022    1:16 PM 01/31/2022    1:26 PM  Fall Risk   Falls in the past year? 0 0 0 0 0  Number falls in past yr: 0  0    Injury with Fall? 0  0    Risk for fall due to : Impaired balance/gait;Impaired mobility  No Fall Risks    Follow up Falls evaluation completed;Education provided;Falls prevention discussed  Falls prevention discussed      FALL RISK PREVENTION PERTAINING TO THE HOME:  Any stairs in or around the home? No  If so, are there any without handrails? No  Home free of loose throw rugs in walkways, pet beds, electrical cords, etc? Yes  Adequate lighting in your home to reduce risk of falls? Yes   ASSISTIVE DEVICES UTILIZED TO PREVENT FALLS:  Life alert? No  Use of a cane, walker or w/c? Yes  Grab bars in the bathroom? Yes  Shower chair or bench in shower? Yes  Elevated toilet seat or a handicapped toilet? Yes   TIMED UP AND  GO:  Was the test performed? No . Telephonic visit   Cognitive Function:        08/22/2022    2:35 PM 08/16/2021    2:26 PM  6CIT Screen  What Year? 0 points 0 points  What month? 0 points 0 points  What time? 0 points 0 points  Count back from 20 0 points 0 points  Months in reverse 0 points 0 points  Repeat phrase 2 points 0 points  Total Score 2 points 0 points    Immunizations Immunization History  Administered Date(s) Administered   COVID-19, mRNA, vaccine(Comirnaty)12 years and older 05/04/2022   Fluad Quad(high Dose 65+) 04/01/2019, 05/08/2021   Hepb-cpg 05/23/2021, 06/22/2021, 07/25/2021, 09/19/2021, 11/28/2021, 12/19/2021, 01/23/2022, 03/20/2022   Influenza Split 04/21/2014   Influenza,inj,Quad PF,6+ Mos 04/20/2013, 07/11/2015, 05/27/2016, 04/23/2018   Influenza-Unspecified 07/11/2015, 05/27/2016, 05/04/2022   PFIZER(Purple Top)SARS-COV-2 Vaccination 08/30/2019, 09/20/2019, 05/25/2020   Pneumococcal Conjugate-13 07/17/2015   Pneumococcal Polysaccharide-23 09/06/2010   Tdap 08/05/2010   Unspecified SARS-COV-2 Vaccination 05/04/2022   Zoster, Live 08/08/2015    TDAP status: Due, Education has been provided regarding the importance of this vaccine. Advised may receive  this vaccine at local pharmacy or Health Dept. Aware to provide a copy of the vaccination record if obtained from local pharmacy or Health Dept. Verbalized acceptance and understanding.  Flu Vaccine status: Up to date  Pneumococcal vaccine status: Up to date  Covid-19 vaccine status: Information provided on how to obtain vaccines.   Qualifies for Shingles Vaccine? Yes   Zostavax completed No   Shingrix Completed?: No.    Education has been provided regarding the importance of this vaccine. Patient has been advised to call insurance company to determine out of pocket expense if they have not yet received this vaccine. Advised may also receive vaccine at local pharmacy or Health Dept. Verbalized  acceptance and understanding.  Screening Tests Health Maintenance  Topic Date Due   Zoster Vaccines- Shingrix (1 of 2) Never done   DTaP/Tdap/Td (2 - Td or Tdap) 08/05/2020   FOOT EXAM  01/06/2021   HEMOGLOBIN A1C  09/19/2021   COVID-19 Vaccine (5 - 2023-24 season) 06/29/2022   OPHTHALMOLOGY EXAM  12/19/2022   Medicare Annual Wellness (AWV)  08/23/2023   Pneumonia Vaccine 66+ Years old  Completed   INFLUENZA VACCINE  Completed   HPV VACCINES  Aged Out    Health Maintenance  Health Maintenance Due  Topic Date Due   Zoster Vaccines- Shingrix (1 of 2) Never done   DTaP/Tdap/Td (2 - Td or Tdap) 08/05/2020   FOOT EXAM  01/06/2021   HEMOGLOBIN A1C  09/19/2021   COVID-19 Vaccine (5 - 2023-24 season) 06/29/2022    Colorectal cancer screening: No longer required.   Lung Cancer Screening: (Low Dose CT Chest recommended if Age 52-80 years, 30 pack-year currently smoking OR have quit w/in 15years.) does not qualify.   Lung Cancer Screening Referral: n/a   Additional Screening:  Hepatitis C Screening: does not qualify  Vision Screening: Recommended annual ophthalmology exams for early detection of glaucoma and other disorders of the eye. Is the patient up to date with their annual eye exam?  Yes  Who is the provider or what is the name of the office in which the patient attends annual eye exams? [place]  If pt is not established with a provider, would they like to be referred to a provider to establish care? No .   Dental Screening: Recommended annual dental exams for proper oral hygiene  Community Resource Referral / Chronic Care Management: CRR required this visit?  No   CCM required this visit?   Currently receiving services      Plan:     I have personally reviewed and noted the following in the patient's chart:   Medical and social history Use of alcohol, tobacco or illicit drugs  Current medications and supplements including opioid prescriptions. Patient is  currently taking opioid prescriptions. Information provided to patient regarding non-opioid alternatives. Patient advised to discuss non-opioid treatment plan with their provider. Functional ability and status Nutritional status Physical activity Advanced directives List of other physicians Hospitalizations, surgeries, and ER visits in previous 12 months Vitals Screenings to include cognitive, depression, and falls Referrals and appointments  In addition, I have reviewed and discussed with patient certain preventive protocols, quality metrics, and best practice recommendations. A written personalized care plan for preventive services as well as general preventive health recommendations were provided to patient.     Vanetta Mulders, [place]   10/03/6008   Due to this being a virtual visit, the after visit summary with patients personalized plan was offered to patient via mail  or my-chart.  per request, patient was mailed a copy of AVS  Nurse Notes: Patient states that he continues to have insomnia at least 2 nights a week where he stays awake the entire night.

## 2022-08-24 ENCOUNTER — Ambulatory Visit
Admission: EM | Admit: 2022-08-24 | Discharge: 2022-08-24 | Disposition: A | Discharge status: Home / Self Care | Payer: PPO | Attending: Emergency Medicine | Admitting: Emergency Medicine

## 2022-08-24 DIAGNOSIS — R0981 Nasal congestion: Secondary | ICD-10-CM

## 2022-08-24 DIAGNOSIS — Z1152 Encounter for screening for COVID-19: Secondary | ICD-10-CM

## 2022-08-24 DIAGNOSIS — R110 Nausea: Secondary | ICD-10-CM | POA: Insufficient documentation

## 2022-08-24 MED ORDER — ONDANSETRON HCL 4 MG PO TABS: 4.0000 mg | ORAL_TABLET | Freq: Three times a day (TID) | ORAL | 0 refills | Status: DC | PRN

## 2022-08-24 NOTE — Discharge Instructions (Addendum)
COVID testing ordered.   Zofran was prescribed for nausea Use medications for symptom relief Call or go to the ED if you have any new or worsening symptoms such as fever, worsening cough, shortness of breath, chest tightness, chest pain, turning blue, changes in mental status, etc..Marland Kitchen

## 2022-08-24 NOTE — ED Provider Notes (Addendum)
[name]   [id number] 08/24/22 Arrival Time: 7106   CC: COVID symptoms  SUBJECTIVE: History from: patient and family.  Mark L Peddy Sr. is a 82 y.o. male who who presented to the urgent care for COVID test and loss of appetite, nasal congestion, nausea that started today for the past 2 to 3 days.  Wife reports member does go to dialysis and has been exposed there.  Has not tried any OTC medication.  Denies any relieving factors.  Reports previous symptoms in the past.   Denies fever, chills, fatigue, sinus pain, rhinorrhea, sore throat, SOB, wheezing, chest pain, nausea, changes in bowel or bladder habits.     ROS: As per HPI.  All other pertinent ROS negative.       Past Medical History:  Diagnosis Date   Arthritis    lower back   Chronic back pain    Disc disease   Chronic kidney disease    Coronary atherosclerosis of native coronary artery    Coronary calcifications by chest CT, Myoview demonstrating inferolateral scar   Deafness in right ear    Diabetes mellitus    Diabetic retinopathy (HCC)    moderate nonproliferative with macular edema in right eye   Diastolic dysfunction 26/9485   Grade 1.   Essential hypertension, benign    Heart murmur    in past   HOH (hard of hearing)    Hyperkalemia    Kidney stones    Mixed hyperlipidemia    NAFLD (nonalcoholic fatty liver disease)    Pancreatic insufficiency    Diagnosed at [facility]   Prostate cancer [facility]) 2011   Shortness of breath dyspnea    occasional - liver pressing on right lung, decreased capacity   Subdural hematoma (HCC)    Type 2 diabetes mellitus ([place])    Wears hearing aid    left   Past Surgical History:  Procedure Laterality Date   APPENDECTOMY     AV FISTULA PLACEMENT Left 05/15/2021   Procedure: INSERTION OF LEFT ARM ARTERIOVENOUS (AV) GORE-TEX GRAFT;  Surgeon: Rosetta Posner, MD;  Location: [facility];  Service: Vascular;  Laterality: Left;   Bilateral hip replacement     CATARACT  EXTRACTION W/PHACO Left 05/15/2015   Procedure: CATARACT EXTRACTION PHACO AND INTRAOCULAR LENS PLACEMENT ([place]);  Surgeon: Ronnell Freshwater, MD;  Location: [place];  Service: Ophthalmology;  Laterality: Left;  DIABETIC - insulin pump and oral meds   CATARACT EXTRACTION W/PHACO Right 08/28/2015   Procedure: CATARACT EXTRACTION PHACO AND INTRAOCULAR LENS PLACEMENT ([facility]);  Surgeon: Ronnell Freshwater, MD;  Location: [place];  Service: Ophthalmology;  Laterality: Right;  DIABETIC PER PT AND CINDY PLEASE KEEP ARRIVAL TIME AFTER 8AM    CHOLECYSTECTOMY     HERNIA REPAIR     INSERTION OF DIALYSIS CATHETER Right 01/22/2021   Procedure: INSERTION OF DIALYSIS CATHETER;  Surgeon: Virl Cagey, MD;  Location: [facility];  Service: General;  Laterality: Right;   INSERTION OF DIALYSIS CATHETER Right 04/16/2021   Procedure: INSERTION OF DIALYSIS CATHETER;  Surgeon: Virl Cagey, MD;  Location: [facility];  Service: General;  Laterality: Right;   PROSTATECTOMY  2011   Allergies  Allergen Reactions   Enalapril Maleate Cough   No current facility-administered medications on file prior to encounter.   Current Outpatient Medications on File Prior to Encounter  Medication Sig Dispense Refill   cinacalcet (SENSIPAR) 30 MG tablet SMARTSIG:1 Tablet(s) By Mouth Every Evening  CREON 12000-38000 units CPEP capsule Take 1 capsule (12,000 Units total) by mouth 3 (three) times daily before meals. Take before evening meal 90 capsule 3   diazepam (VALIUM) 2 MG tablet TAKE 1 TABLET(2 MG) BY MOUTH AT BEDTIME AS NEEDED FOR INSOMNIA 30 tablet 2   diltiazem (CARDIZEM CD) 240 MG 24 hr capsule TAKE 1 CAPSULE(240 MG) BY MOUTH DAILY 90 capsule 3   HYDROcodone-acetaminophen (NORCO/VICODIN) 5-325 MG tablet Take 1 tablet by mouth every 12 (twelve) hours as needed for severe pain. Must last 30 days. 60 tablet 0   insulin aspart (NOVOLOG) 100 UNIT/ML injection Inject 8 Units into the skin 3  (three) times daily with meals. (Patient taking differently: Inject 6 Units into the skin in the morning.) 10 mL 11   insulin glargine (LANTUS) 100 UNIT/ML injection Inject 0.12 mLs (12 Units total) into the skin at bedtime. (Patient taking differently: Inject 6 Units into the skin in the morning.) 10 mL 11   lidocaine-prilocaine (EMLA) cream APPLY SMALL AMOUNT TO ACCESS SITE (AVF) 30 MINUTES BEFORE DIALYSIS. COVER WITH OCCLUSIVE DRESSING (SARAN WRAP) 30 g 11   metoprolol tartrate (LOPRESSOR) 25 MG tablet Take 1 tablet (25 mg total) by mouth 2 (two) times daily. TAKE 1 TABLET(50 MG) BY MOUTH TWICE DAILY Strength: 50 mg 60 tablet 3   pravastatin (PRAVACHOL) 10 MG tablet TAKE 1 TABLET(10 MG) BY MOUTH DAILY WITH BREAKFAST 90 tablet 0   Social History   Socioeconomic History   Marital status: Married    Spouse name: Darlene   Number of children: 3   Years of education: Not on file   Highest education level: Not on file  Occupational History   Not on file  Tobacco Use   Smoking status: Never   Smokeless tobacco: Never  Vaping Use   Vaping Use: Never used  Substance and Sexual Activity   Alcohol use: No   Drug use: No   Sexual activity: Yes  Other Topics Concern   Not on file  Social History Narrative   3 children, 1 daughter and 1 son sorry deceased. 1 daughter living in [place] and 1 step child   Married x 13 years 08/25/21.   7 grandchildren   1 great grandchild   Social Determinants of Health   Financial Resource Strain: Low Risk  (08/22/2022)   Overall Financial Resource Strain (CARDIA)    Difficulty of Paying Living Expenses: Not hard at all  Food Insecurity: No Food Insecurity (08/22/2022)   Hunger Vital Sign    Worried About Running Out of Food in the Last Year: Never true    Ran Out of Food in the Last Year: Never true  Transportation Needs: No Transportation Needs (08/22/2022)   PRAPARE - Hydrologist (Medical): No    Lack of Transportation  (Non-Medical): No  Physical Activity: Inactive (08/22/2022)   Exercise Vital Sign    Days of Exercise per Week: 0 days    Minutes of Exercise per Session: 0 min  Stress: No Stress Concern Present (08/22/2022)   [place]    Feeling of Stress : Not at all  Social Connections: Moderately Isolated (08/22/2022)   Social Connection and Isolation Panel [NHANES]    Frequency of Communication with Friends and Family: More than three times a week    Frequency of Social Gatherings with Friends and Family: Three times a week    Attends Religious Services: Never  Active Member of Clubs or Organizations: No    Attends Archivist Meetings: Never    Marital Status: Married  Human resources officer Violence: Not At Risk (08/22/2022)   Humiliation, Afraid, Rape, and Kick questionnaire    Fear of Current or Ex-Partner: No    Emotionally Abused: No    Physically Abused: No    Sexually Abused: No   Family History  Problem Relation Age of Onset   Diabetes type II Mother    Heart attack Father     OBJECTIVE:  Vitals:   08/24/22 1224  BP: (!) 163/101  Pulse: (!) 127  Resp: 20  Temp: 98.1 F (36.7 C)  TempSrc: Oral  SpO2: 96%     General appearance: alert; appears fatigued, but nontoxic; speaking in full sentences and tolerating own secretions HEENT: NCAT; Ears: EACs clear, TMs pearly gray; Eyes: PERRL.  EOM grossly intact. Sinuses: nontender; Nose: nares patent without rhinorrhea, Throat: oropharynx clear, tonsils non erythematous or enlarged, uvula midline  Neck: supple without LAD Lungs: unlabored respirations, symmetrical air entry; cough: absent; no respiratory distress; CTAB Heart: regular rate and rhythm.  Radial pulses 2+ symmetrical bilaterally Skin: warm and dry Psychological: alert and cooperative; normal mood and affect  LABS:  No results found for this or any previous visit (from the past 24 hour(s)).    ASSESSMENT & PLAN:  1. Nasal congestion   2. Nausea without vomiting   3. Encounter for screening for COVID-19     Meds ordered this encounter  Medications   ondansetron (ZOFRAN) 4 MG tablet    Sig: Take 1 tablet (4 mg total) by mouth every 8 (eight) hours as needed for nausea or vomiting.    Dispense:  20 tablet    Refill:  0   Patient is on dialysis and if positive for COVID-19 please prescribe Molnupiravir .  Discharge instructions    COVID testing ordered.   Zofran was prescribed for nausea Use medications for symptom relief Call or go to the ED if you have any new or worsening symptoms such as fever, worsening cough, shortness of breath, chest tightness, chest pain, turning blue, changes in mental status, etc...   Reviewed expectations re: course of current medical issues. Questions answered. Outlined signs and symptoms indicating need for more acute intervention. Patient verbalized understanding. After Visit Summary given.          Emerson Monte, FNP 08/24/22 1258    Emerson Monte, FNP 08/24/22 1300

## 2022-08-24 NOTE — ED Triage Notes (Signed)
Pt presents for COVID test, as they some cases at the Dialysis unit. Reports low appetite, runny nose x 2-3 days.

## 2022-08-25 LAB — SARS CORONAVIRUS 2 (TAT 6-24 HRS): SARS Coronavirus 2: NEGATIVE

## 2022-08-26 DIAGNOSIS — D689 Coagulation defect, unspecified: Secondary | ICD-10-CM | POA: Diagnosis not present

## 2022-08-26 DIAGNOSIS — Z992 Dependence on renal dialysis: Secondary | ICD-10-CM | POA: Diagnosis not present

## 2022-08-26 DIAGNOSIS — N186 End stage renal disease: Secondary | ICD-10-CM | POA: Diagnosis not present

## 2022-08-26 DIAGNOSIS — D509 Iron deficiency anemia, unspecified: Secondary | ICD-10-CM | POA: Diagnosis not present

## 2022-08-26 DIAGNOSIS — N2581 Secondary hyperparathyroidism of renal origin: Secondary | ICD-10-CM | POA: Diagnosis not present

## 2022-08-26 DIAGNOSIS — E876 Hypokalemia: Secondary | ICD-10-CM | POA: Diagnosis not present

## 2022-08-28 ENCOUNTER — Telehealth: Payer: Self-pay

## 2022-08-28 NOTE — Telephone Encounter (Signed)
PA FOR PT'S LIDOCAIN-PRILOCAINE 2.5-2.5% HAS BEEN APPROVED. MJP,LPN Information regarding your request Prior Authorization duplicate/approved

## 2022-08-31 ENCOUNTER — Other Ambulatory Visit: Payer: Self-pay | Admitting: Family Medicine

## 2022-09-02 DIAGNOSIS — N186 End stage renal disease: Secondary | ICD-10-CM | POA: Diagnosis not present

## 2022-09-02 DIAGNOSIS — E1122 Type 2 diabetes mellitus with diabetic chronic kidney disease: Secondary | ICD-10-CM | POA: Diagnosis not present

## 2022-09-02 DIAGNOSIS — N2581 Secondary hyperparathyroidism of renal origin: Secondary | ICD-10-CM | POA: Diagnosis not present

## 2022-09-02 DIAGNOSIS — Z992 Dependence on renal dialysis: Secondary | ICD-10-CM | POA: Diagnosis not present

## 2022-09-02 DIAGNOSIS — D689 Coagulation defect, unspecified: Secondary | ICD-10-CM | POA: Diagnosis not present

## 2022-09-02 NOTE — Telephone Encounter (Signed)
Requested medication (s) are due for refill today -yes  Requested medication (s) are on the active medication list -yes  Future visit scheduled -no  Last refill: 05/29/22 #90   Notes to clinic: last OV 05/23/22- fails lab protocol- over 1 year 2020  Requested Prescriptions  Pending Prescriptions Disp Refills   pravastatin (PRAVACHOL) 10 MG tablet [Pharmacy Med Name: PRAVASTATIN '10MG'$  TABLETS] 90 tablet 0    Sig: TAKE 1 TABLET(10 MG) BY MOUTH DAILY WITH BREAKFAST     Cardiovascular:  Antilipid - Statins Failed - 08/31/2022  1:11 PM      Failed - Valid encounter within last 12 months    Recent Outpatient Visits           1 year ago Need for immunization against influenza   [place] [name], MD   1 year ago Acute renal failure superimposed on stage 4 chronic kidney disease, unspecified acute renal failure type ([place])   [place] [name], MD   1 year ago 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 Desirable range <100 mg/dL for primary prevention;   <70 mg/dL for patients with CHD or diabetic patients  with > or = 2 CHD risk factors. Marland Kitchen LDL-C is now calculated using the Martin-Hopkins  calculation, which is a validated novel method providing  better accuracy than the Friedewald equation in the  estimation of LDL-C.  Cresenciano Genre et al. Annamaria Helling. 2774;128(78): 2061-2068   (http://education.[facility].com/faq/FAQ164)    Direct LDL  Date Value Ref Range Status  05/27/2016 54 <130 mg/dL Final    Comment:      Desirable range <100 mg/dL for patients with CHD or diabetes and <70 mg/dL for diabetic patients with known heart disease.      HDL  Date Value Ref Range Status  07/20/2019 38 (L) > OR = 40 mg/dL Final   Triglycerides  Date Value Ref Range Status  07/20/2019 81 <150 mg/dL Final         Passed - Patient is not pregnant         Requested Prescriptions  Pending Prescriptions Disp Refills   pravastatin (PRAVACHOL) 10 MG tablet [Pharmacy Med Name: PRAVASTATIN '10MG'$  TABLETS] 90 tablet 0    Sig: TAKE 1 TABLET(10 MG) BY MOUTH DAILY WITH BREAKFAST     Cardiovascular:  Antilipid - Statins Failed - 08/31/2022  1:11 PM      Failed - Valid encounter within last 12 months    Recent Outpatient Visits           1 year ago Need for immunization against influenza   [place], Warren T, MD   1 year ago Acute renal failure superimposed on stage 4 chronic kidney disease, unspecified acute renal failure type ([place])   [facility] [name], MD   1 year ago Calciphylaxis of lower extremity with nonhealing ulcer, limited to breakdown of skin [facility])   [place] Pickard, Cammie Mcgee, MD   1 year ago COVID-19   [place] Pickard, Cammie Mcgee, MD   1 year ago [place] Dennard Schaumann, Cammie Mcgee, MD              Failed - Lipid Panel in normal range within the last 12 months    Cholesterol  Date Value Ref Range Status  07/20/2019 103 <200 mg/dL Final   LDL Cholesterol (Calc)  Date Value Ref Range Status  07/20/2019 49 mg/dL (calc) Final    Comment:    Reference range: <100 . Desirable range <100 mg/dL for primary prevention;   <70 mg/dL for patients with CHD or diabetic patients  with > or = 2 CHD risk factors. Marland Kitchen LDL-C is now  calculated using the Martin-Hopkins  calculation, which is a validated novel method providing  better accuracy than the Friedewald equation in the  estimation of LDL-C.  Cresenciano Genre et al. Annamaria Helling. 7741;423(95): 2061-2068  (http://education.[facility].com/faq/FAQ164)    Direct LDL  Date Value Ref Range Status  05/27/2016 54 <130 mg/dL Final    Comment:      Desirable range <100 mg/dL for patients with CHD or diabetes and <70 mg/dL for diabetic patients with known heart disease.      HDL  Date Value Ref Range Status  07/20/2019 38 (L) > OR = 40 mg/dL Final   Triglycerides  Date Value Ref Range Status  07/20/2019 81 <150 mg/dL Final         Passed - Patient is not pregnant

## 2022-09-04 ENCOUNTER — Other Ambulatory Visit: Payer: Self-pay

## 2022-09-04 DIAGNOSIS — Z992 Dependence on renal dialysis: Secondary | ICD-10-CM | POA: Diagnosis not present

## 2022-09-04 DIAGNOSIS — K8689 Other specified diseases of pancreas: Secondary | ICD-10-CM

## 2022-09-04 DIAGNOSIS — E1122 Type 2 diabetes mellitus with diabetic chronic kidney disease: Secondary | ICD-10-CM | POA: Diagnosis not present

## 2022-09-04 DIAGNOSIS — K760 Fatty (change of) liver, not elsewhere classified: Secondary | ICD-10-CM

## 2022-09-04 DIAGNOSIS — I471 Supraventricular tachycardia, unspecified: Secondary | ICD-10-CM

## 2022-09-04 DIAGNOSIS — Z794 Long term (current) use of insulin: Secondary | ICD-10-CM

## 2022-09-04 DIAGNOSIS — E1142 Type 2 diabetes mellitus with diabetic polyneuropathy: Secondary | ICD-10-CM

## 2022-09-04 DIAGNOSIS — N186 End stage renal disease: Secondary | ICD-10-CM | POA: Diagnosis not present

## 2022-09-04 DIAGNOSIS — I10 Essential (primary) hypertension: Secondary | ICD-10-CM

## 2022-09-04 DIAGNOSIS — I2510 Atherosclerotic heart disease of native coronary artery without angina pectoris: Secondary | ICD-10-CM

## 2022-09-05 DIAGNOSIS — H6062 Unspecified chronic otitis externa, left ear: Secondary | ICD-10-CM | POA: Diagnosis not present

## 2022-09-05 DIAGNOSIS — H903 Sensorineural hearing loss, bilateral: Secondary | ICD-10-CM | POA: Diagnosis not present

## 2022-09-05 DIAGNOSIS — H6122 Impacted cerumen, left ear: Secondary | ICD-10-CM | POA: Diagnosis not present

## 2022-09-05 DIAGNOSIS — T161XXA Foreign body in right ear, initial encounter: Secondary | ICD-10-CM | POA: Diagnosis not present

## 2022-09-06 DIAGNOSIS — N186 End stage renal disease: Secondary | ICD-10-CM | POA: Diagnosis not present

## 2022-09-06 DIAGNOSIS — D689 Coagulation defect, unspecified: Secondary | ICD-10-CM | POA: Diagnosis not present

## 2022-09-06 DIAGNOSIS — N2581 Secondary hyperparathyroidism of renal origin: Secondary | ICD-10-CM | POA: Diagnosis not present

## 2022-09-06 DIAGNOSIS — Z992 Dependence on renal dialysis: Secondary | ICD-10-CM | POA: Diagnosis not present

## 2022-09-09 DIAGNOSIS — Z992 Dependence on renal dialysis: Secondary | ICD-10-CM | POA: Diagnosis not present

## 2022-09-09 DIAGNOSIS — N2581 Secondary hyperparathyroidism of renal origin: Secondary | ICD-10-CM | POA: Diagnosis not present

## 2022-09-09 DIAGNOSIS — D689 Coagulation defect, unspecified: Secondary | ICD-10-CM | POA: Diagnosis not present

## 2022-09-09 DIAGNOSIS — N186 End stage renal disease: Secondary | ICD-10-CM | POA: Diagnosis not present

## 2022-09-10 ENCOUNTER — Telehealth: Payer: Self-pay

## 2022-09-10 NOTE — Telephone Encounter (Signed)
My Chart Message from patient's wife Darlene:   Trayton asked me to contact your office re:  phone call on 2/13 at 9:00 a.m. with above.  No info given to call back if a problem. He has so many things going on he feels he does not need the above pharmacist to get involved, especially because dialysis is always changing his med's weekly.   I do not know how to contact this person but was told from your office.  He wants this phone call to be cancelled.   Thanks, Lora Havens  I went ahead and cancelled the appointment for the patient. I just wanted to let you know. Thank you!

## 2022-09-12 ENCOUNTER — Other Ambulatory Visit: Payer: Self-pay

## 2022-09-12 DIAGNOSIS — Z79899 Other long term (current) drug therapy: Secondary | ICD-10-CM

## 2022-09-16 DIAGNOSIS — D689 Coagulation defect, unspecified: Secondary | ICD-10-CM | POA: Diagnosis not present

## 2022-09-16 DIAGNOSIS — Z992 Dependence on renal dialysis: Secondary | ICD-10-CM | POA: Diagnosis not present

## 2022-09-16 DIAGNOSIS — N2581 Secondary hyperparathyroidism of renal origin: Secondary | ICD-10-CM | POA: Diagnosis not present

## 2022-09-16 DIAGNOSIS — E876 Hypokalemia: Secondary | ICD-10-CM | POA: Diagnosis not present

## 2022-09-16 DIAGNOSIS — N186 End stage renal disease: Secondary | ICD-10-CM | POA: Diagnosis not present

## 2022-09-17 ENCOUNTER — Encounter: Payer: PPO | Admitting: Pharmacist

## 2022-09-17 ENCOUNTER — Encounter: Payer: Self-pay | Admitting: Student in an Organized Health Care Education/Training Program

## 2022-09-17 ENCOUNTER — Ambulatory Visit
Payer: PPO | Attending: Student in an Organized Health Care Education/Training Program | Admitting: Student in an Organized Health Care Education/Training Program

## 2022-09-17 VITALS — BP 119/78 | HR 105 | Temp 97.2°F | Resp 16 | Ht 68.0 in | Wt 153.0 lb

## 2022-09-17 DIAGNOSIS — G894 Chronic pain syndrome: Secondary | ICD-10-CM | POA: Insufficient documentation

## 2022-09-17 DIAGNOSIS — E1142 Type 2 diabetes mellitus with diabetic polyneuropathy: Secondary | ICD-10-CM | POA: Insufficient documentation

## 2022-09-17 DIAGNOSIS — Z79891 Long term (current) use of opiate analgesic: Secondary | ICD-10-CM | POA: Insufficient documentation

## 2022-09-17 DIAGNOSIS — L97923 Non-pressure chronic ulcer of unspecified part of left lower leg with necrosis of muscle: Secondary | ICD-10-CM | POA: Insufficient documentation

## 2022-09-17 DIAGNOSIS — Z0289 Encounter for other administrative examinations: Secondary | ICD-10-CM | POA: Diagnosis not present

## 2022-09-17 DIAGNOSIS — Z794 Long term (current) use of insulin: Secondary | ICD-10-CM | POA: Insufficient documentation

## 2022-09-17 DIAGNOSIS — N186 End stage renal disease: Secondary | ICD-10-CM | POA: Diagnosis not present

## 2022-09-17 MED ORDER — HYDROCODONE-ACETAMINOPHEN 5-325 MG PO TABS: 1.0000 | ORAL_TABLET | Freq: Two times a day (BID) | ORAL | 0 refills | Status: AC | PRN

## 2022-09-17 MED ORDER — HYDROCODONE-ACETAMINOPHEN 5-325 MG PO TABS: 1.0000 | ORAL_TABLET | Freq: Two times a day (BID) | ORAL | 0 refills | Status: DC | PRN

## 2022-09-17 NOTE — Progress Notes (Signed)
PROVIDER NOTE: Information contained herein reflects review and annotations entered in association with encounter. Interpretation of such information and data should be left to medically-trained personnel. Information provided to patient can be located elsewhere in the medical record under "Patient Instructions". Document created using STT-dictation technology, any transcriptional errors that may result from process are unintentional.    Patient: Mark Jumbo Sr.  Service Category: E/M  Provider: [name], MD  DOB: 06-09-1941  DOS: 09/17/2022  Specialty: Interventional Pain Management  MRN: [id number]  Setting: Ambulatory outpatient  PCP: Mark Frizzle, MD  Type: Established Patient    Referring Provider: [name], MD  Location: Office  Delivery: Face-to-face     HPI  [name], a 82 y.o. year old male, is here today because of his Chronic pain syndrome [G89.4]. [name] primary complain today is Other (No pain) Last encounter: My last encounter with him was on 06/18/22 Pertinent problems: Mr. [name] has End stage renal disease ([place]); Pain management contract signed; Calciphylaxis of left lower extremity with nonhealing ulcer with necrosis of muscle ([place]); and Chronic pain syndrome on their pertinent problem list. Pain Assessment: Severity of   is reported as a 0-No pain/10. Location:    / . Onset:  . Quality:  . Timing:  . Modifying factor(s):  Marland Kitchen Vitals:  height is 5' 8"$  (1.727 m) and weight is 153 lb (69.4 kg). His temperature is 97.2 F (36.2 C) (abnormal). His blood pressure is 119/78 and his pulse is 105 (abnormal). His respiration is 16 and oxygen saturation is 100%.   Reason for encounter: medication management.    [name] presents today for medication management. He continues to participate in dialysis. Endorses persistent and severe lower extremity neuropathic pain. Continues hydrocodone every 12 hours as needed for severe breakthrough pain,  especially after dialysis.   Pharmacotherapy Assessment  Analgesic: Hydrocodone 5 mg every 8 hours as needed, #90/month MME= 15   Monitoring: [place] PMP: PDMP reviewed during this encounter.       Pharmacotherapy: No side-effects or adverse reactions reported. Compliance: No problems identified. Effectiveness: Clinically acceptable.  [name], RN  09/17/2022  1:05 PM  Sign when Signing Visit Nursing Pain Medication Assessment:  Safety precautions to be maintained throughout the outpatient stay will include: orient to surroundings, keep bed in low position, maintain call bell within reach at all times, provide assistance with transfer out of bed and ambulation.  Medication Inspection Compliance: Pill count conducted under aseptic conditions, in front of the patient. Neither the pills nor the bottle was removed from the patient's sight at any time. Once count was completed pills were immediately returned to the patient in their original bottle.  Medication: Hydrocodone/APAP Pill/Patch Count:  47 of 60 pills remain Pill/Patch Appearance: Markings consistent with prescribed medication Bottle Appearance: Standard pharmacy container. Clearly labeled. Filled Date: 02 / 06 / 2023 Last Medication intake:  Today     UDS:  Summary  Date Value Ref Range Status  09/06/2021 Note  Final    Comment:    ==================================================================== [employer] Select 13 (MW) ==================================================================== Test                             Result       Flag       Units  Drug Present and Declared for Prescription Verification   Hydrocodone                    >  15873       EXPECTED   ng/mg creat   Hydromorphone                  1019         EXPECTED   ng/mg creat   Dihydrocodeine                 1089         EXPECTED   ng/mg creat   Norhydrocodone                 7886         EXPECTED   ng/mg creat    Sources of hydrocodone include scheduled  prescription medications.    Hydromorphone, dihydrocodeine and norhydrocodone are expected    metabolites of hydrocodone. Hydromorphone and dihydrocodeine are    also available as scheduled prescription medications.  Drug Present not Declared for Prescription Verification   Desmethyldiazepam              40           UNEXPECTED ng/mg creat   Oxazepam                       194          UNEXPECTED ng/mg creat    Desmethyldiazepam and oxazepam are benzodiazepine drugs, but may    also be present as common metabolites of other benzodiazepine drugs,    including diazepam, chlordiazepoxide, prazepam, clorazepate, and    halazepam.  ==================================================================== Test                      Result    Flag   Units      Ref Range   Creatinine              63               mg/dL      >=20 ==================================================================== Declared Medications:  The flagging and interpretation on this report are based on the  following declared medications.  Unexpected results may arise from  inaccuracies in the declared medications.   **Note: The testing scope of this panel includes these medications:   Hydrocodone (Norco)   **Note: The testing scope of this panel does not include the  following reported medications:   Acetaminophen (Norco)  Aspirin  Calcitriol  Diltiazem  Insulin (NovoLog)  Metoprolol  Pancrelipase  Pravastatin  Sevelamer (Renvela)  Sodium Bicarbonate  Topical ==================================================================== For clinical consultation, please call [phone]. ====================================================================      ROS  Constitutional: Denies any fever or chills Gastrointestinal: No reported hemesis, hematochezia, vomiting, or acute GI distress Musculoskeletal:  Bilateral leg pain Neurological:  Paresthesias  Medication Review  HYDROcodone-acetaminophen,  cinacalcet, diazepam, diltiazem, insulin aspart, insulin glargine, lidocaine-prilocaine, lipase/protease/amylase, metoprolol tartrate, ondansetron, and pravastatin  History Review  Allergy: [name] is allergic to enalapril maleate. Drug: [name]  reports no history of drug use. Alcohol:  reports no history of alcohol use. Tobacco:  reports that he has never smoked. He has never used smokeless tobacco. Social: [name]  reports that he has never smoked. He has never used smokeless tobacco. He reports that he does not drink alcohol and does not use drugs. Medical:  has a past medical history of Arthritis, Chronic back pain, Chronic kidney disease, Coronary atherosclerosis of native coronary artery, Deafness in right ear, Diabetes mellitus, Diabetic retinopathy ([place]), Diastolic dysfunction (99991111), Essential hypertension,  benign, Heart murmur, HOH (hard of hearing), Hyperkalemia, Kidney stones, Mixed hyperlipidemia, NAFLD (nonalcoholic fatty liver disease), Pancreatic insufficiency, Prostate cancer ([place]) (2011), Shortness of breath dyspnea, Subdural hematoma ([place]), Type 2 diabetes mellitus ([place]), and Wears hearing aid. Surgical: [name]  has a past surgical history that includes Prostatectomy (2011); Hernia repair; Appendectomy; Cholecystectomy; Bilateral hip replacement; Cataract extraction w/PHACO (Left, 05/15/2015); Cataract extraction w/PHACO (Right, 08/28/2015); Insertion of dialysis catheter (Right, 01/22/2021); Insertion of dialysis catheter (Right, 04/16/2021); and AV fistula placement (Left, 05/15/2021). Family: family history includes Diabetes type II in his mother; Heart attack in his father.  Laboratory Chemistry Profile   Renal Lab Results  Component Value Date   BUN 39 (H) 05/15/2021   CREATININE 3.00 (H) 05/15/2021   LABCREA 35.88 01/16/2021   BCR 19 04/12/2021   GFR 65.09 01/29/2017   GFRAA 42 (L) 12/30/2019   GFRNONAA 18 (L) 04/18/2021   GFRNONAA 17 (L) 04/18/2021     Hepatic Lab Results  Component Value Date   AST 16 04/18/2021   ALT 9 04/18/2021   ALBUMIN 2.0 (L) 04/18/2021   ALBUMIN 2.0 (L) 04/18/2021   ALKPHOS 143 (H) 04/18/2021   LIPASE 21 12/23/2020   AMMONIA 50 12/23/2019    Electrolytes Lab Results  Component Value Date   NA 136 05/15/2021   K 4.2 05/15/2021   CL 99 05/15/2021   CALCIUM 7.3 (L) 04/18/2021   CALCIUM 7.1 (L) 04/18/2021   MG 1.7 02/06/2021   PHOS 6.0 (H) 04/18/2021    Bone Lab Results  Component Value Date   VD25OH 25.27 (L) 12/28/2020   TESTOFREE 43.1 (L) 02/28/2015   TESTOSTERONE 295 11/25/2017    Inflammation (CRP: Acute Phase) (ESR: Chronic Phase) Lab Results  Component Value Date   CRP 14.5 (H) 01/09/2021   ESRSEDRATE 11 12/02/2018   LATICACIDVEN 1.2 04/13/2021         Note: Above Lab results reviewed.  Recent Imaging Review  VAS [place] ABI WITH/WO TBI  LOWER EXTREMITY DOPPLER STUDY  Patient Name:  Mark MOGA Stogsdill SR.  Date of Exam:   06/06/2021 Medical Rec #: [id number]             Accession #:    [id number] Date of Birth: 1940/08/17             Patient Gender: M Patient Age:   62 years Exam Location:  [facility] Procedure:      VAS [place] ABI WITH/WO TBI Referring Phys: Jenna Luo  --------------------------------------------------------------------------------   Indications: Left leg pain.  High Risk Factors: Diabetes.  Other Factors: Calciphylaxis.  Performing Technologist: Ralene Cork RVT    Examination Guidelines: A complete evaluation includes at minimum, Doppler waveform signals and systolic blood pressure reading at the level of bilateral brachial, anterior tibial, and posterior tibial arteries, when vessel segments are accessible. Bilateral testing is considered an integral part of a complete examination. Photoelectric Plethysmograph (PPG) waveforms and toe systolic pressure readings are included as required and additional duplex testing as needed. Limited  examinations for reoccurring indications may be performed as noted.    ABI Findings: +---------+------------------+-----+----------+--------+ Right    Rt Pressure (mmHg)IndexWaveform  Comment  +---------+------------------+-----+----------+--------+ Brachial 130                                       +---------+------------------+-----+----------+--------+ PTA      255  1.96 biphasic           +---------+------------------+-----+----------+--------+ DP       255               1.96 monophasic         +---------+------------------+-----+----------+--------+ Great Toe255               1.96                    +---------+------------------+-----+----------+--------+  +---------+------------------+-----+--------+-------+ Left     Lt Pressure (mmHg)IndexWaveformComment +---------+------------------+-----+--------+-------+ PTA      255               1.96 biphasic        +---------+------------------+-----+--------+-------+ DP       255               1.96 biphasic        +---------+------------------+-----+--------+-------+ Great Toe253               1.95                 +---------+------------------+-----+--------+-------+  +-------+-----------+-----------+------------+------------+ ABI/TBIToday's ABIToday's TBIPrevious ABIPrevious TBI +-------+-----------+-----------+------------+------------+ Right  [place]         [place]                                  +-------+-----------+-----------+------------+------------+ Left   [place]                                           +-------+-----------+-----------+------------+------------+     No previous ABI.   Summary: Right: Resting right ankle-brachial index indicates noncompressible right lower extremity arteries. The right toe-brachial index is abnormal.  Left: Resting left ankle-brachial index indicates noncompressible left lower extremity arteries. The left  toe-brachial index is abnormal.    *See table(s) above for measurements and observations.    Electronically signed by Curt Jews MD on 06/06/2021 at 3:02:04 PM.       Final   Note: Reviewed        Physical Exam  General appearance: Well nourished, well developed, and well hydrated. In no apparent acute distress Mental status: Alert, oriented x 3 (person, place, & time)       Respiratory: No evidence of acute respiratory distress Eyes: PERLA Vitals: BP 119/78   Pulse (!) 105   Temp (!) 97.2 F (36.2 C)   Resp 16   Ht 5' 8"$  (1.727 m)   Wt 153 lb (69.4 kg)   SpO2 100%   BMI 23.26 kg/m  BMI: Estimated body mass index is 23.26 kg/m as calculated from the following:   Height as of this encounter: 5' 8"$  (1.727 m).   Weight as of this encounter: 153 lb (69.4 kg). Ideal: Ideal body weight: 68.4 kg (150 lb 12.7 oz) Adjusted ideal body weight: 68.8 kg (151 lb 10.8 oz)  Assessment   Diagnosis Status  1. Chronic pain syndrome   2. Calciphylaxis of left lower extremity with nonhealing ulcer with necrosis of muscle ([place])   3. End stage renal disease ([place])   4. Type 2 diabetes mellitus with diabetic polyneuropathy, with long-term current use of insulin ([place])   5. Encounter for long-term opiate analgesic use   6. Pain management contract signed     Controlled Controlled Controlled   Plan of  Care    Mr. BRAYANT SOBH Sr. has a current medication list which includes the following long-term medication(s): diltiazem, insulin aspart, insulin glargine, metoprolol tartrate, and pravastatin.  Wean hydrocodone as below  Pharmacotherapy (Medications Ordered): Meds ordered this encounter  Medications   HYDROcodone-acetaminophen (NORCO/VICODIN) 5-325 MG tablet    Sig: Take 1 tablet by mouth every 12 (twelve) hours as needed for severe pain. Must last 30 days.    Dispense:  60 tablet    Refill:  0    Chronic Pain: STOP Act (Not applicable) Fill 1 day early if closed on refill date.  Avoid benzodiazepines within 8 hours of opioids   HYDROcodone-acetaminophen (NORCO/VICODIN) 5-325 MG tablet    Sig: Take 1 tablet by mouth every 12 (twelve) hours as needed for severe pain. Must last 30 days.    Dispense:  60 tablet    Refill:  0    Chronic Pain: STOP Act (Not applicable) Fill 1 day early if closed on refill date. Avoid benzodiazepines within 8 hours of opioids   HYDROcodone-acetaminophen (NORCO/VICODIN) 5-325 MG tablet    Sig: Take 1 tablet by mouth every 12 (twelve) hours as needed for severe pain. Must last 30 days.    Dispense:  60 tablet    Refill:  0    Chronic Pain: STOP Act (Not applicable) Fill 1 day early if closed on refill date. Avoid benzodiazepines within 8 hours of opioids    Follow-up plan:   Return in about 16 weeks (around 01/07/2023) for Medication Management, in person.    Recent Visits No visits were found meeting these conditions. Showing recent visits within past 90 days and meeting all other requirements Today's Visits Date Type Provider Dept  09/17/22 Office Visit [name], MD [facility]  Showing today's visits and meeting all other requirements Future Appointments No visits were found meeting these conditions. Showing future appointments within next 90 days and meeting all other requirements  I discussed the assessment and treatment plan with the patient. The patient was provided an opportunity to ask questions and all were answered. The patient agreed with the plan and demonstrated an understanding of the instructions.  Patient advised to call back or seek an in-person evaluation if the symptoms or condition worsens.  Duration of encounter: 41mnutes.  Note by: BGillis Santa MD Date: 09/17/2022; Time: 1:52 PM

## 2022-09-17 NOTE — Progress Notes (Signed)
Nursing Pain Medication Assessment:  Safety precautions to be maintained throughout the outpatient stay will include: orient to surroundings, keep bed in low position, maintain call bell within reach at all times, provide assistance with transfer out of bed and ambulation.  Medication Inspection Compliance: Pill count conducted under aseptic conditions, in front of the patient. Neither the pills nor the bottle was removed from the patient's sight at any time. Once count was completed pills were immediately returned to the patient in their original bottle.  Medication: Hydrocodone/APAP Pill/Patch Count:  47 of 60 pills remain Pill/Patch Appearance: Markings consistent with prescribed medication Bottle Appearance: Standard pharmacy container. Clearly labeled. Filled Date: 02 / 06 / 2023 Last Medication intake:  Today

## 2022-09-23 DIAGNOSIS — Z992 Dependence on renal dialysis: Secondary | ICD-10-CM | POA: Diagnosis not present

## 2022-09-23 DIAGNOSIS — N186 End stage renal disease: Secondary | ICD-10-CM | POA: Diagnosis not present

## 2022-09-23 DIAGNOSIS — N2581 Secondary hyperparathyroidism of renal origin: Secondary | ICD-10-CM | POA: Diagnosis not present

## 2022-09-23 DIAGNOSIS — E039 Hypothyroidism, unspecified: Secondary | ICD-10-CM | POA: Diagnosis not present

## 2022-09-23 DIAGNOSIS — E1122 Type 2 diabetes mellitus with diabetic chronic kidney disease: Secondary | ICD-10-CM | POA: Diagnosis not present

## 2022-09-23 DIAGNOSIS — D689 Coagulation defect, unspecified: Secondary | ICD-10-CM | POA: Diagnosis not present

## 2022-09-23 DIAGNOSIS — D631 Anemia in chronic kidney disease: Secondary | ICD-10-CM | POA: Diagnosis not present

## 2022-09-29 ENCOUNTER — Other Ambulatory Visit: Payer: Self-pay | Admitting: Family Medicine

## 2022-09-30 DIAGNOSIS — E876 Hypokalemia: Secondary | ICD-10-CM | POA: Diagnosis not present

## 2022-09-30 DIAGNOSIS — N2581 Secondary hyperparathyroidism of renal origin: Secondary | ICD-10-CM | POA: Diagnosis not present

## 2022-09-30 DIAGNOSIS — D689 Coagulation defect, unspecified: Secondary | ICD-10-CM | POA: Diagnosis not present

## 2022-09-30 DIAGNOSIS — N186 End stage renal disease: Secondary | ICD-10-CM | POA: Diagnosis not present

## 2022-09-30 DIAGNOSIS — Z992 Dependence on renal dialysis: Secondary | ICD-10-CM | POA: Diagnosis not present

## 2022-10-02 ENCOUNTER — Other Ambulatory Visit: Payer: Self-pay | Admitting: Family Medicine

## 2022-10-03 ENCOUNTER — Encounter: Payer: Self-pay | Admitting: Radiology

## 2022-10-03 DIAGNOSIS — H6062 Unspecified chronic otitis externa, left ear: Secondary | ICD-10-CM | POA: Diagnosis not present

## 2022-10-03 DIAGNOSIS — H6123 Impacted cerumen, bilateral: Secondary | ICD-10-CM | POA: Diagnosis not present

## 2022-10-03 DIAGNOSIS — Z992 Dependence on renal dialysis: Secondary | ICD-10-CM | POA: Diagnosis not present

## 2022-10-03 DIAGNOSIS — E1122 Type 2 diabetes mellitus with diabetic chronic kidney disease: Secondary | ICD-10-CM | POA: Diagnosis not present

## 2022-10-03 DIAGNOSIS — N186 End stage renal disease: Secondary | ICD-10-CM | POA: Diagnosis not present

## 2022-10-04 DIAGNOSIS — Z992 Dependence on renal dialysis: Secondary | ICD-10-CM | POA: Diagnosis not present

## 2022-10-04 DIAGNOSIS — N186 End stage renal disease: Secondary | ICD-10-CM | POA: Diagnosis not present

## 2022-10-04 DIAGNOSIS — N2581 Secondary hyperparathyroidism of renal origin: Secondary | ICD-10-CM | POA: Diagnosis not present

## 2022-10-04 DIAGNOSIS — D689 Coagulation defect, unspecified: Secondary | ICD-10-CM | POA: Diagnosis not present

## 2022-10-07 DIAGNOSIS — D689 Coagulation defect, unspecified: Secondary | ICD-10-CM | POA: Diagnosis not present

## 2022-10-07 DIAGNOSIS — N2581 Secondary hyperparathyroidism of renal origin: Secondary | ICD-10-CM | POA: Diagnosis not present

## 2022-10-07 DIAGNOSIS — Z992 Dependence on renal dialysis: Secondary | ICD-10-CM | POA: Diagnosis not present

## 2022-10-07 DIAGNOSIS — E876 Hypokalemia: Secondary | ICD-10-CM | POA: Diagnosis not present

## 2022-10-07 DIAGNOSIS — N186 End stage renal disease: Secondary | ICD-10-CM | POA: Diagnosis not present

## 2022-10-14 DIAGNOSIS — E876 Hypokalemia: Secondary | ICD-10-CM | POA: Diagnosis not present

## 2022-10-14 DIAGNOSIS — N2581 Secondary hyperparathyroidism of renal origin: Secondary | ICD-10-CM | POA: Diagnosis not present

## 2022-10-14 DIAGNOSIS — D689 Coagulation defect, unspecified: Secondary | ICD-10-CM | POA: Diagnosis not present

## 2022-10-14 DIAGNOSIS — N186 End stage renal disease: Secondary | ICD-10-CM | POA: Diagnosis not present

## 2022-10-14 DIAGNOSIS — Z992 Dependence on renal dialysis: Secondary | ICD-10-CM | POA: Diagnosis not present

## 2022-10-16 IMAGING — CT CT HEAD W/O CM
3 of 6 series · 14 of 47 positions shown, 17 images · non-contrast
Comparison: 12/04/2018

CLINICAL DATA: Mental status changes

EXAM:
CT HEAD WITHOUT CONTRAST
TECHNIQUE: Contiguous axial images were obtained from the base of the skull
through the vertex without intravenous contrast.

[Series 2: head w o · axial · 0.50mm/px · z∈[-12,+123]mm · 9 of 35 slices shown, 12 images]
[im 4/35  brain]
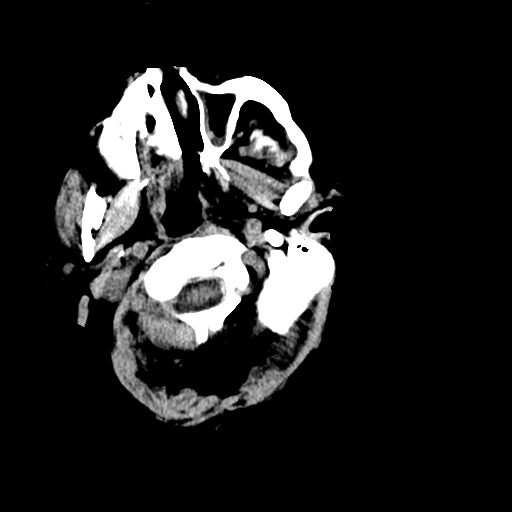
[im 4/35  bone]
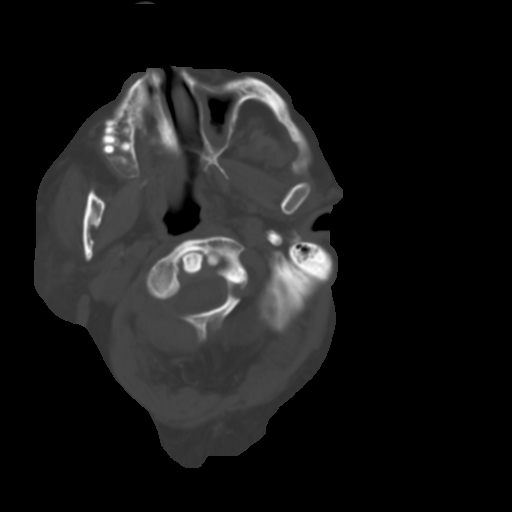
[im 7/35  brain]
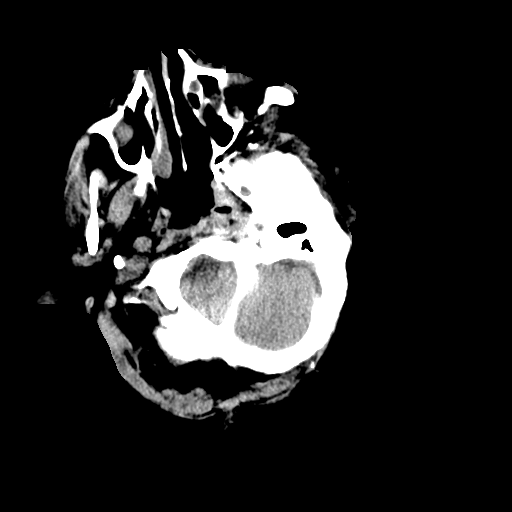
[im 11/35  brain]
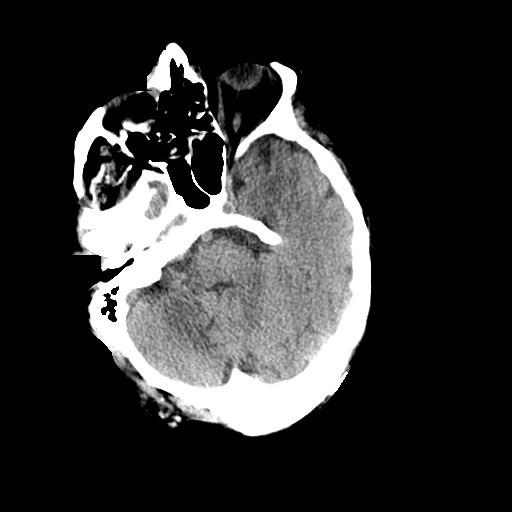
[im 14/35  brain]
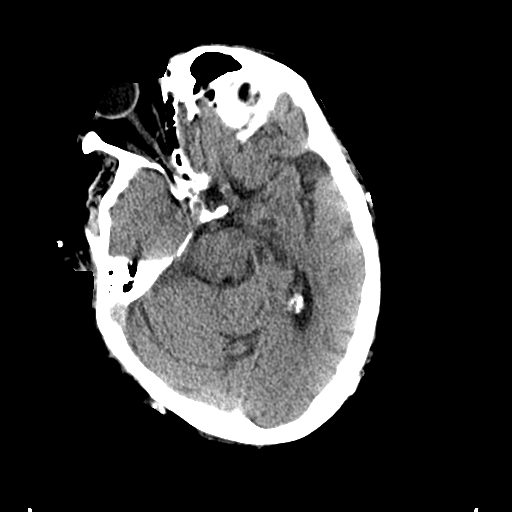
[im 18/35  brain]
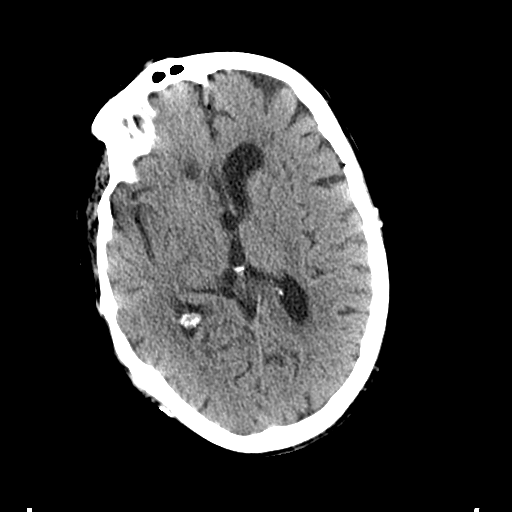
[im 18/35  bone]
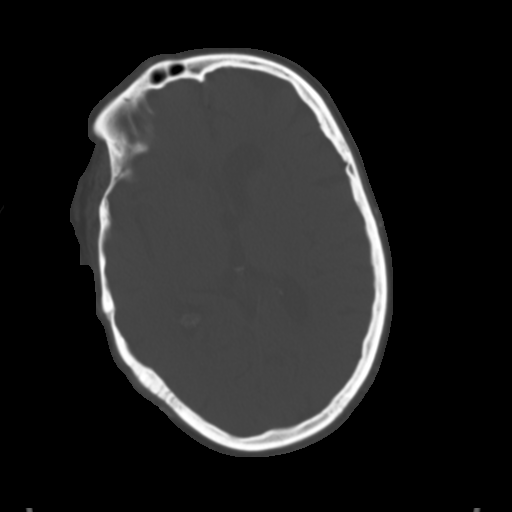
[im 21/35  brain]
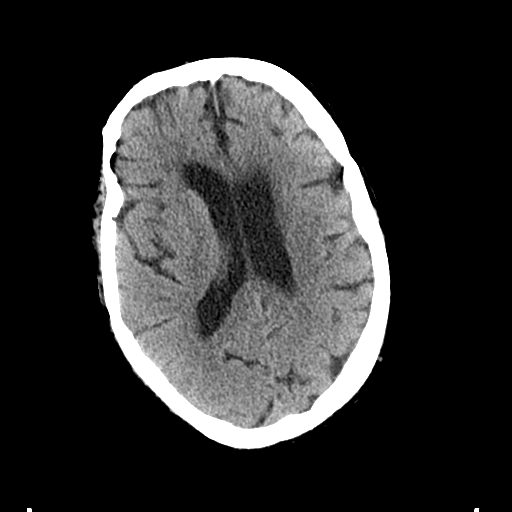
[im 24/35  brain]
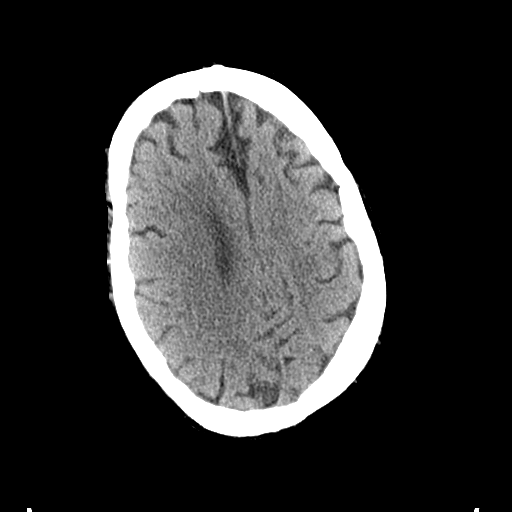
[im 28/35  brain]
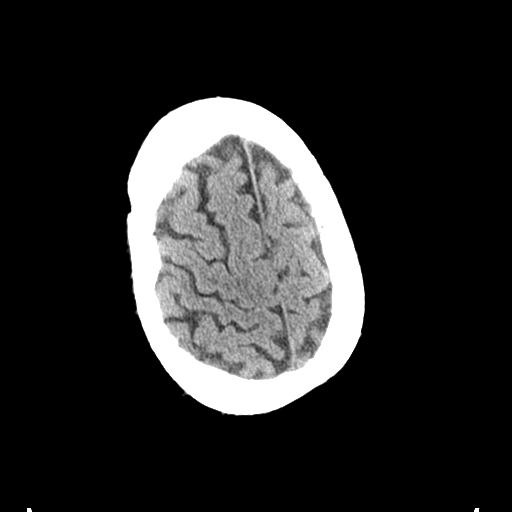
[im 31/35  brain]
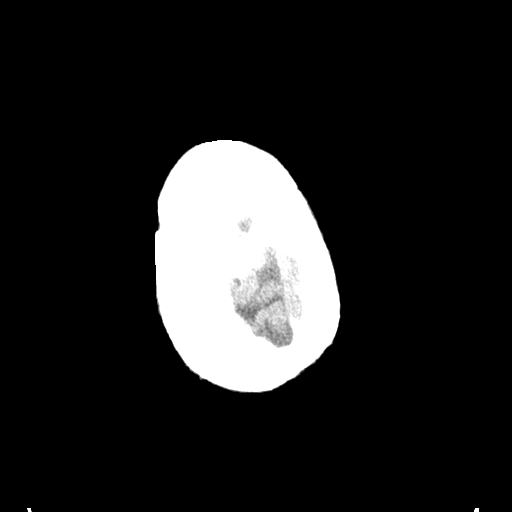
[im 31/35  bone]
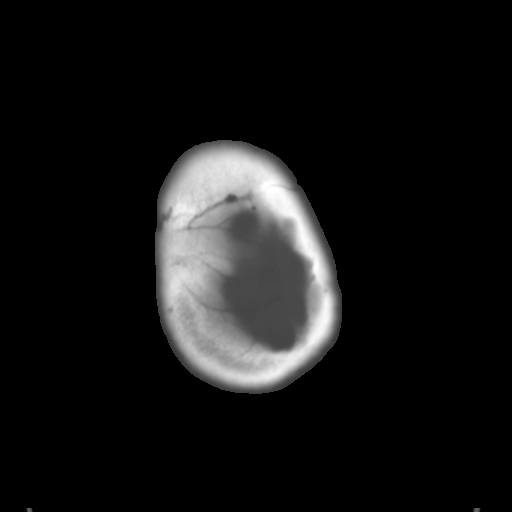

[Series 6: coronal soft · coronal · 0.34mm/px · 3 of 79 slices shown]
[im 19/79  brain]
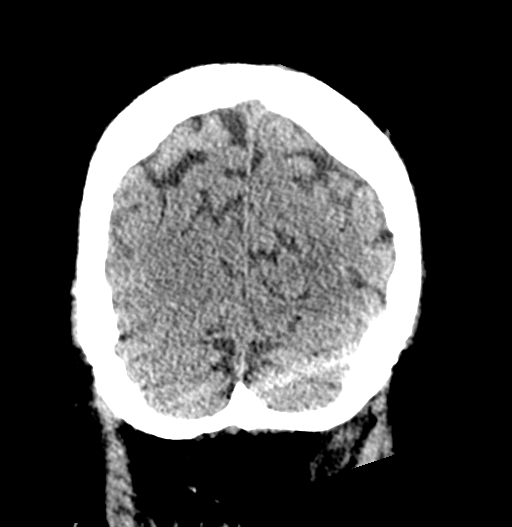
[im 38/79  brain]
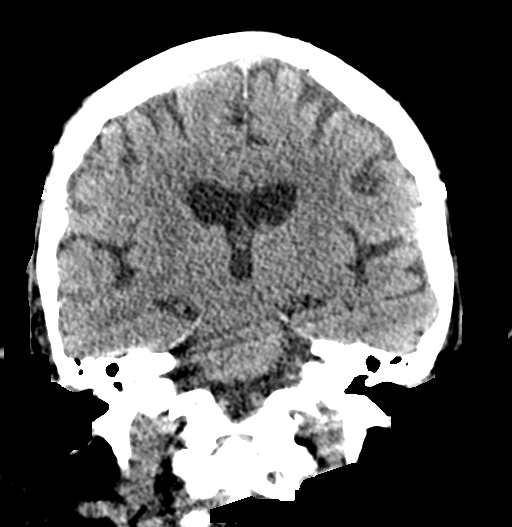
[im 56/79  brain]
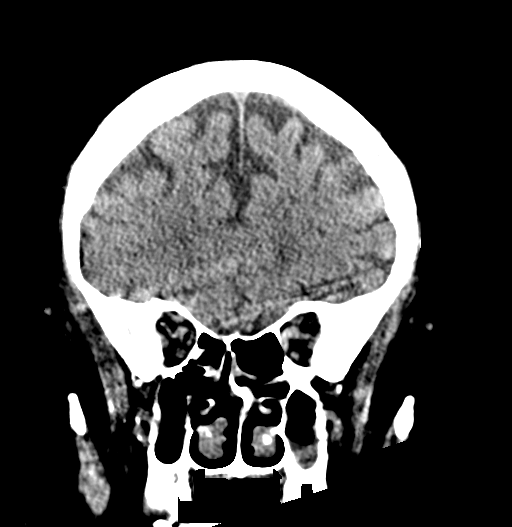

[Series 9: sagittal soft · sagittal · 0.25mm/px · 2 of 67 slices shown]
[im 23/67  brain]
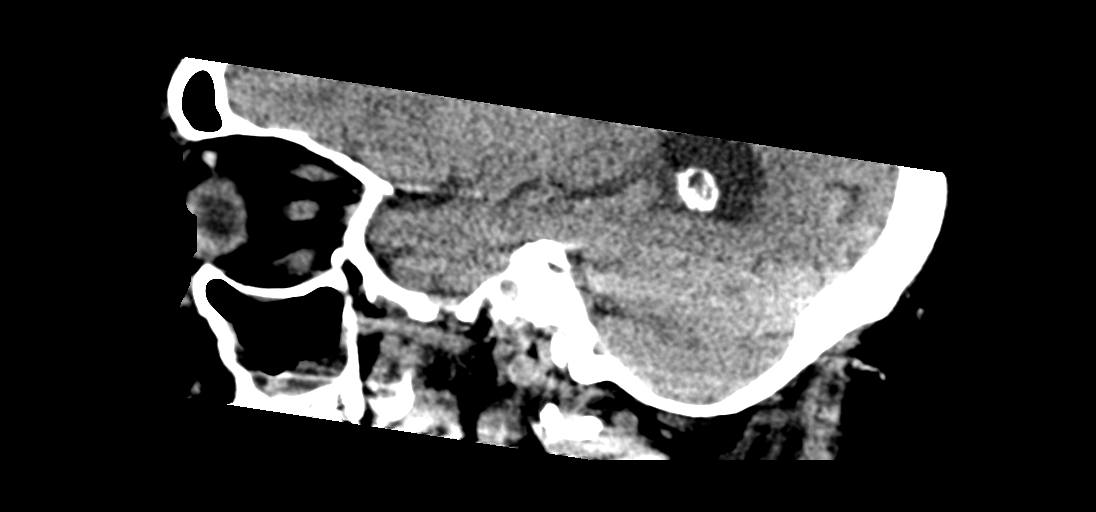
[im 45/67  brain]
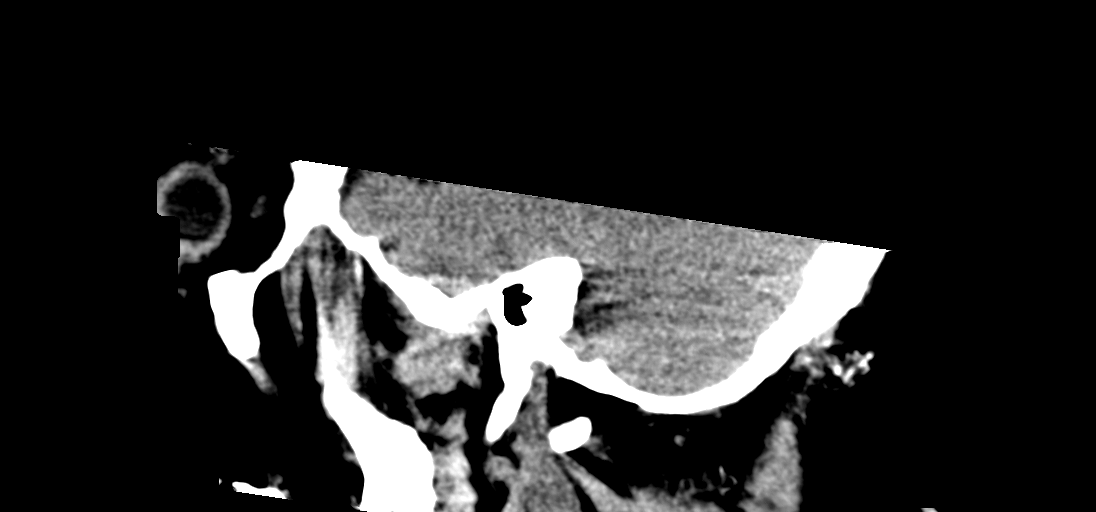

[14 of 47 positions shown; findings below may reference images not displayed]

FINDINGS: Brain: No acute intracranial abnormality. Specifically, no
hemorrhage, hydrocephalus, mass lesion, acute infarction, or
significant intracranial injury. There is atrophy and chronic small
vessel disease changes.

Vascular: No hyperdense vessel or unexpected calcification.

Skull: No acute calvarial abnormality.

Sinuses/Orbits: No acute findings

Other: None
IMPRESSION: Atrophy, chronic microvascular disease.

No acute intracranial abnormality.

## 2022-10-21 DIAGNOSIS — E1122 Type 2 diabetes mellitus with diabetic chronic kidney disease: Secondary | ICD-10-CM | POA: Diagnosis not present

## 2022-10-21 DIAGNOSIS — Z992 Dependence on renal dialysis: Secondary | ICD-10-CM | POA: Diagnosis not present

## 2022-10-21 DIAGNOSIS — D689 Coagulation defect, unspecified: Secondary | ICD-10-CM | POA: Diagnosis not present

## 2022-10-21 DIAGNOSIS — N186 End stage renal disease: Secondary | ICD-10-CM | POA: Diagnosis not present

## 2022-10-21 DIAGNOSIS — N2581 Secondary hyperparathyroidism of renal origin: Secondary | ICD-10-CM | POA: Diagnosis not present

## 2022-10-28 DIAGNOSIS — D631 Anemia in chronic kidney disease: Secondary | ICD-10-CM | POA: Diagnosis not present

## 2022-10-28 DIAGNOSIS — N186 End stage renal disease: Secondary | ICD-10-CM | POA: Diagnosis not present

## 2022-10-28 DIAGNOSIS — Z992 Dependence on renal dialysis: Secondary | ICD-10-CM | POA: Diagnosis not present

## 2022-10-28 DIAGNOSIS — N2581 Secondary hyperparathyroidism of renal origin: Secondary | ICD-10-CM | POA: Diagnosis not present

## 2022-10-28 DIAGNOSIS — E876 Hypokalemia: Secondary | ICD-10-CM | POA: Diagnosis not present

## 2022-10-28 DIAGNOSIS — D689 Coagulation defect, unspecified: Secondary | ICD-10-CM | POA: Diagnosis not present

## 2022-10-29 ENCOUNTER — Other Ambulatory Visit: Payer: Self-pay | Admitting: Family Medicine

## 2022-10-29 NOTE — Telephone Encounter (Signed)
Requested medication (s) are due for refill today - yes  Requested medication (s) are on the active medication list -yes  Future visit scheduled -no  Last refill: 10/03/22 #30   Notes to clinic: non delegated Rx  Requested Prescriptions  Pending Prescriptions Disp Refills   diazepam (VALIUM) 2 MG tablet [Pharmacy Med Name: DIAZEPAM 2MG  TABLETS] 30 tablet     Sig: TAKE 1 TABLET(2 MG) BY MOUTH AT BEDTIME AS NEEDED FOR INSOMNIA     Not Delegated - Psychiatry: Anxiolytics/Hypnotics 2 Failed - 10/29/2022 11:58 AM      Failed - This refill cannot be delegated      Failed - Urine Drug Screen completed in last 360 days      Failed - Valid encounter within last 6 months    Recent Outpatient Visits           1 year ago Need for immunization against influenza   [place], Warren T, MD   1 year ago Acute renal failure superimposed on stage 4 chronic kidney disease, unspecified acute renal failure type ([place])   [place] [name], MD   1 year ago Calciphylaxis of lower extremity with nonhealing ulcer, limited to breakdown of skin ([place])   [place] Pickard, Cammie Mcgee, MD   1 year ago [place] Pickard, Cammie Mcgee, MD   1 year ago [place] Pickard, Cammie Mcgee, MD              Passed - Patient is not pregnant         Requested Prescriptions  Pending Prescriptions Disp Refills   diazepam (VALIUM) 2 MG tablet [Pharmacy Med Name: DIAZEPAM 2MG  TABLETS] 30 tablet     Sig: TAKE 1 TABLET(2 MG) BY MOUTH AT BEDTIME AS NEEDED FOR INSOMNIA     Not Delegated - Psychiatry: Anxiolytics/Hypnotics 2 Failed - 10/29/2022 11:58 AM      Failed - This refill cannot be delegated      Failed - Urine Drug Screen completed in last 360 days      Failed - Valid encounter within last 6 months    Recent Outpatient Visits           1 year ago Need for immunization against  influenza   [place] [name], MD   1 year ago Acute renal failure superimposed on stage 4 chronic kidney disease, unspecified acute renal failure type ([place])   [place] [name], MD   1 year ago Calciphylaxis of lower extremity with nonhealing ulcer, limited to breakdown of skin ([place])   [place] Pickard, Cammie Mcgee, MD   1 year ago COVID-19   [place], Cammie Mcgee, MD   1 year ago [place], Cammie Mcgee, MD              Passed - Patient is not pregnant

## 2022-11-03 DIAGNOSIS — E1122 Type 2 diabetes mellitus with diabetic chronic kidney disease: Secondary | ICD-10-CM | POA: Diagnosis not present

## 2022-11-03 DIAGNOSIS — Z992 Dependence on renal dialysis: Secondary | ICD-10-CM | POA: Diagnosis not present

## 2022-11-03 DIAGNOSIS — N186 End stage renal disease: Secondary | ICD-10-CM | POA: Diagnosis not present

## 2022-11-04 DIAGNOSIS — D689 Coagulation defect, unspecified: Secondary | ICD-10-CM | POA: Diagnosis not present

## 2022-11-04 DIAGNOSIS — N186 End stage renal disease: Secondary | ICD-10-CM | POA: Diagnosis not present

## 2022-11-04 DIAGNOSIS — N2581 Secondary hyperparathyroidism of renal origin: Secondary | ICD-10-CM | POA: Diagnosis not present

## 2022-11-04 DIAGNOSIS — E876 Hypokalemia: Secondary | ICD-10-CM | POA: Diagnosis not present

## 2022-11-04 DIAGNOSIS — Z992 Dependence on renal dialysis: Secondary | ICD-10-CM | POA: Diagnosis not present

## 2022-11-11 DIAGNOSIS — N2581 Secondary hyperparathyroidism of renal origin: Secondary | ICD-10-CM | POA: Diagnosis not present

## 2022-11-11 DIAGNOSIS — D689 Coagulation defect, unspecified: Secondary | ICD-10-CM | POA: Diagnosis not present

## 2022-11-11 DIAGNOSIS — Z992 Dependence on renal dialysis: Secondary | ICD-10-CM | POA: Diagnosis not present

## 2022-11-11 DIAGNOSIS — E876 Hypokalemia: Secondary | ICD-10-CM | POA: Diagnosis not present

## 2022-11-11 DIAGNOSIS — N186 End stage renal disease: Secondary | ICD-10-CM | POA: Diagnosis not present

## 2022-11-18 DIAGNOSIS — E1122 Type 2 diabetes mellitus with diabetic chronic kidney disease: Secondary | ICD-10-CM | POA: Diagnosis not present

## 2022-11-18 DIAGNOSIS — D689 Coagulation defect, unspecified: Secondary | ICD-10-CM | POA: Diagnosis not present

## 2022-11-18 DIAGNOSIS — N186 End stage renal disease: Secondary | ICD-10-CM | POA: Diagnosis not present

## 2022-11-18 DIAGNOSIS — N2581 Secondary hyperparathyroidism of renal origin: Secondary | ICD-10-CM | POA: Diagnosis not present

## 2022-11-18 DIAGNOSIS — R110 Nausea: Secondary | ICD-10-CM | POA: Diagnosis not present

## 2022-11-18 DIAGNOSIS — D631 Anemia in chronic kidney disease: Secondary | ICD-10-CM | POA: Diagnosis not present

## 2022-11-18 DIAGNOSIS — Z992 Dependence on renal dialysis: Secondary | ICD-10-CM | POA: Diagnosis not present

## 2022-11-21 DIAGNOSIS — Z992 Dependence on renal dialysis: Secondary | ICD-10-CM | POA: Diagnosis not present

## 2022-11-21 DIAGNOSIS — T82858A Stenosis of vascular prosthetic devices, implants and grafts, initial encounter: Secondary | ICD-10-CM | POA: Diagnosis not present

## 2022-11-21 DIAGNOSIS — I871 Compression of vein: Secondary | ICD-10-CM | POA: Diagnosis not present

## 2022-11-21 DIAGNOSIS — N186 End stage renal disease: Secondary | ICD-10-CM | POA: Diagnosis not present

## 2022-11-25 DIAGNOSIS — E876 Hypokalemia: Secondary | ICD-10-CM | POA: Diagnosis not present

## 2022-11-25 DIAGNOSIS — D689 Coagulation defect, unspecified: Secondary | ICD-10-CM | POA: Diagnosis not present

## 2022-11-25 DIAGNOSIS — N186 End stage renal disease: Secondary | ICD-10-CM | POA: Diagnosis not present

## 2022-11-25 DIAGNOSIS — Z992 Dependence on renal dialysis: Secondary | ICD-10-CM | POA: Diagnosis not present

## 2022-11-25 DIAGNOSIS — N2581 Secondary hyperparathyroidism of renal origin: Secondary | ICD-10-CM | POA: Diagnosis not present

## 2022-12-02 DIAGNOSIS — Z992 Dependence on renal dialysis: Secondary | ICD-10-CM | POA: Diagnosis not present

## 2022-12-02 DIAGNOSIS — N186 End stage renal disease: Secondary | ICD-10-CM | POA: Diagnosis not present

## 2022-12-02 DIAGNOSIS — D689 Coagulation defect, unspecified: Secondary | ICD-10-CM | POA: Diagnosis not present

## 2022-12-02 DIAGNOSIS — N2581 Secondary hyperparathyroidism of renal origin: Secondary | ICD-10-CM | POA: Diagnosis not present

## 2022-12-03 DIAGNOSIS — Z992 Dependence on renal dialysis: Secondary | ICD-10-CM | POA: Diagnosis not present

## 2022-12-03 DIAGNOSIS — E1122 Type 2 diabetes mellitus with diabetic chronic kidney disease: Secondary | ICD-10-CM | POA: Diagnosis not present

## 2022-12-03 DIAGNOSIS — N186 End stage renal disease: Secondary | ICD-10-CM | POA: Diagnosis not present

## 2022-12-04 DIAGNOSIS — N186 End stage renal disease: Secondary | ICD-10-CM | POA: Diagnosis not present

## 2022-12-04 DIAGNOSIS — N2581 Secondary hyperparathyroidism of renal origin: Secondary | ICD-10-CM | POA: Diagnosis not present

## 2022-12-04 DIAGNOSIS — D689 Coagulation defect, unspecified: Secondary | ICD-10-CM | POA: Diagnosis not present

## 2022-12-04 DIAGNOSIS — Z992 Dependence on renal dialysis: Secondary | ICD-10-CM | POA: Diagnosis not present

## 2022-12-09 ENCOUNTER — Other Ambulatory Visit: Payer: Self-pay | Admitting: Family Medicine

## 2022-12-09 DIAGNOSIS — Z992 Dependence on renal dialysis: Secondary | ICD-10-CM | POA: Diagnosis not present

## 2022-12-09 DIAGNOSIS — D689 Coagulation defect, unspecified: Secondary | ICD-10-CM | POA: Diagnosis not present

## 2022-12-09 DIAGNOSIS — N2581 Secondary hyperparathyroidism of renal origin: Secondary | ICD-10-CM | POA: Diagnosis not present

## 2022-12-09 DIAGNOSIS — N186 End stage renal disease: Secondary | ICD-10-CM | POA: Diagnosis not present

## 2022-12-09 DIAGNOSIS — E876 Hypokalemia: Secondary | ICD-10-CM | POA: Diagnosis not present

## 2022-12-10 NOTE — Telephone Encounter (Signed)
Requested Prescriptions  Pending Prescriptions Disp Refills   pravastatin (PRAVACHOL) 10 MG tablet [Pharmacy Med Name: PRAVASTATIN 10MG  TABLETS] 90 tablet 0    Sig: TAKE 1 TABLET(10 MG) BY MOUTH DAILY WITH BREAKFAST     Cardiovascular:  Antilipid - Statins Failed - 12/09/2022  8:53 AM      Failed - Valid encounter within last 12 months    Recent Outpatient Visits           1 year ago Need for immunization against influenza   [facility] Donita Brooks, MD   1 year ago Acute renal failure superimposed on stage 4 chronic kidney disease, unspecified acute renal failure type ([facility])   [facility] Donita Brooks, MD   1 year ago Calciphylaxis of lower extremity with nonhealing ulcer, limited to breakdown of skin ([facility])   [facility] Pickard, Priscille Heidelberg, MD   1 year ago COVID-19   [facility] Pickard, Priscille Heidelberg, MD   1 year ago COVID-19   [facility] Tanya Nones, Priscille Heidelberg, MD              Failed - Lipid Panel in normal range within the last 12 months    Cholesterol  Date Value Ref Range Status  07/20/2019 103 <200 mg/dL Final   LDL Cholesterol (Calc)  Date Value Ref Range Status  07/20/2019 49 mg/dL (calc) Final    Comment:    Reference range: <100 . Desirable range <100 mg/dL for primary prevention;   <70 mg/dL for patients with CHD or diabetic patients  with > or = 2 CHD risk factors. Marland Kitchen LDL-C is now calculated using the Martin-Hopkins  calculation, which is a validated novel method providing  better accuracy than the Friedewald equation in the  estimation of LDL-C.  Horald Pollen et al. Lenox Ahr. 1610;960(45): 2061-2068  (http://education.[employer].com/faq/FAQ164)    Direct LDL  Date Value Ref Range Status  05/27/2016 54 <130 mg/dL Final    Comment:      Desirable range <100 mg/dL for patients with CHD or diabetes and <70 mg/dL for diabetic patients with known heart disease.      HDL   Date Value Ref Range Status  07/20/2019 38 (L) > OR = 40 mg/dL Final   Triglycerides  Date Value Ref Range Status  07/20/2019 81 <150 mg/dL Final         Passed - Patient is not pregnant

## 2022-12-16 DIAGNOSIS — E039 Hypothyroidism, unspecified: Secondary | ICD-10-CM | POA: Diagnosis not present

## 2022-12-16 DIAGNOSIS — Z992 Dependence on renal dialysis: Secondary | ICD-10-CM | POA: Diagnosis not present

## 2022-12-16 DIAGNOSIS — N186 End stage renal disease: Secondary | ICD-10-CM | POA: Diagnosis not present

## 2022-12-16 DIAGNOSIS — E1122 Type 2 diabetes mellitus with diabetic chronic kidney disease: Secondary | ICD-10-CM | POA: Diagnosis not present

## 2022-12-16 DIAGNOSIS — D689 Coagulation defect, unspecified: Secondary | ICD-10-CM | POA: Diagnosis not present

## 2022-12-16 DIAGNOSIS — N2581 Secondary hyperparathyroidism of renal origin: Secondary | ICD-10-CM | POA: Diagnosis not present

## 2022-12-16 DIAGNOSIS — D631 Anemia in chronic kidney disease: Secondary | ICD-10-CM | POA: Diagnosis not present

## 2022-12-23 DIAGNOSIS — N186 End stage renal disease: Secondary | ICD-10-CM | POA: Diagnosis not present

## 2022-12-23 DIAGNOSIS — D689 Coagulation defect, unspecified: Secondary | ICD-10-CM | POA: Diagnosis not present

## 2022-12-23 DIAGNOSIS — N2581 Secondary hyperparathyroidism of renal origin: Secondary | ICD-10-CM | POA: Diagnosis not present

## 2022-12-23 DIAGNOSIS — R52 Pain, unspecified: Secondary | ICD-10-CM | POA: Diagnosis not present

## 2022-12-23 DIAGNOSIS — Z992 Dependence on renal dialysis: Secondary | ICD-10-CM | POA: Diagnosis not present

## 2022-12-30 DIAGNOSIS — D689 Coagulation defect, unspecified: Secondary | ICD-10-CM | POA: Diagnosis not present

## 2022-12-30 DIAGNOSIS — Z992 Dependence on renal dialysis: Secondary | ICD-10-CM | POA: Diagnosis not present

## 2022-12-30 DIAGNOSIS — N186 End stage renal disease: Secondary | ICD-10-CM | POA: Diagnosis not present

## 2022-12-30 DIAGNOSIS — E876 Hypokalemia: Secondary | ICD-10-CM | POA: Diagnosis not present

## 2022-12-30 DIAGNOSIS — N2581 Secondary hyperparathyroidism of renal origin: Secondary | ICD-10-CM | POA: Diagnosis not present

## 2022-12-31 ENCOUNTER — Ambulatory Visit
Payer: PPO | Attending: Student in an Organized Health Care Education/Training Program | Admitting: Student in an Organized Health Care Education/Training Program

## 2022-12-31 ENCOUNTER — Encounter: Payer: Self-pay | Admitting: Student in an Organized Health Care Education/Training Program

## 2022-12-31 VITALS — BP 118/67 | HR 82 | Temp 98.2°F | Resp 18 | Ht 68.0 in | Wt 143.0 lb

## 2022-12-31 DIAGNOSIS — Z79891 Long term (current) use of opiate analgesic: Secondary | ICD-10-CM | POA: Diagnosis not present

## 2022-12-31 DIAGNOSIS — L97923 Non-pressure chronic ulcer of unspecified part of left lower leg with necrosis of muscle: Secondary | ICD-10-CM | POA: Diagnosis not present

## 2022-12-31 DIAGNOSIS — N186 End stage renal disease: Secondary | ICD-10-CM | POA: Diagnosis not present

## 2022-12-31 DIAGNOSIS — E1142 Type 2 diabetes mellitus with diabetic polyneuropathy: Secondary | ICD-10-CM

## 2022-12-31 DIAGNOSIS — E1140 Type 2 diabetes mellitus with diabetic neuropathy, unspecified: Secondary | ICD-10-CM

## 2022-12-31 DIAGNOSIS — Z794 Long term (current) use of insulin: Secondary | ICD-10-CM | POA: Insufficient documentation

## 2022-12-31 DIAGNOSIS — Z0289 Encounter for other administrative examinations: Secondary | ICD-10-CM

## 2022-12-31 DIAGNOSIS — G894 Chronic pain syndrome: Secondary | ICD-10-CM

## 2022-12-31 MED ORDER — HYDROCODONE-ACETAMINOPHEN 5-325 MG PO TABS: 1.0000 | ORAL_TABLET | Freq: Two times a day (BID) | ORAL | 0 refills | Status: AC | PRN

## 2022-12-31 MED ORDER — HYDROCODONE-ACETAMINOPHEN 5-325 MG PO TABS: 1.0000 | ORAL_TABLET | Freq: Two times a day (BID) | ORAL | 0 refills | Status: DC | PRN

## 2022-12-31 NOTE — Progress Notes (Signed)
PROVIDER NOTE: Information contained herein reflects review and annotations entered in association with encounter. Interpretation of such information and data should be left to medically-trained personnel. Information provided to patient can be located elsewhere in the medical record under "Patient Instructions". Document created using STT-dictation technology, any transcriptional errors that may result from process are unintentional.    Patient: Mark Art Sr.  Service Category: E/M  Provider: Edward Jolly, MD  DOB: 1941/01/25  DOS: 12/31/2022  Specialty: Interventional Pain Management  MRN: [id number]  Setting: Ambulatory outpatient  PCP: Donita Brooks, MD  Type: Established Patient    Referring Provider: Donita Brooks, MD  Location: Office  Delivery: Face-to-face     HPI  [name], a 82 y.o. year old male, is here today because of his Chronic pain syndrome [G89.4]. Mr. [name] primary complain today is feet pain Last encounter: My last encounter with him was on 09/17/22 Pertinent problems: Mr. Drayton has End stage renal disease (HCC); Pain management contract signed; Calciphylaxis of left lower extremity with nonhealing ulcer with necrosis of muscle (HCC); and Chronic pain syndrome on their pertinent problem list. Pain Assessment: Severity of Chronic pain is reported as a 1 /10. Location: Foot Right, Left/Can radiates uptowards legs bilateral. Onset: More than a month ago. Quality: Aching, Sharp. Timing: Intermittent. Modifying factor(s): Hydroconde and Tylenol. Vitals:  height is 5\' 8"  (1.727 m) and weight is 143 lb (64.9 kg). His temporal temperature is 98.2 F (36.8 C). His blood pressure is 118/67 and his pulse is 82. His respiration is 18 and oxygen saturation is 100%.   Reason for encounter: medication management.    Casey presents today for medication management. He continues with dialysis. Endorses persistent and severe lower extremity neuropathic pain.  Continues hydrocodone every 12 hours as needed for severe breakthrough pain, especially after dialysis. Discussed Qutenza for PDN for bilateral feet   Pharmacotherapy Assessment  Analgesic: Hydrocodone 5 mg every 8 hours as needed, #90/month MME= 15   Monitoring: [place] PMP: PDMP reviewed during this encounter.       Pharmacotherapy: No side-effects or adverse reactions reported. Compliance: No problems identified. Effectiveness: Clinically acceptable.  Earlyne Iba, RN  12/31/2022  1:40 PM  Sign when Signing Visit Safety precautions to be maintained throughout the outpatient stay will include: orient to surroundings, keep bed in low position, maintain call bell within reach at all times, provide assistance with transfer out of bed and ambulation.   Nursing Pain Medication Assessment:  Safety precautions to be maintained throughout the outpatient stay will include: orient to surroundings, keep bed in low position, maintain call bell within reach at all times, provide assistance with transfer out of bed and ambulation.  Medication Inspection Compliance: Pill count conducted under aseptic conditions, in front of the patient. Neither the pills nor the bottle was removed from the patient's sight at any time. Once count was completed pills were immediately returned to the patient in their original bottle.  Medication: Hydrocodone/APAP Pill/Patch Count:  19 of 60 pills remain Pill/Patch Appearance: Markings consistent with prescribed medication Bottle Appearance: Standard pharmacy container. Clearly labeled. Filled Date: 05 / 07 / 2024 Last Medication intake:  Today     UDS:  Summary  Date Value Ref Range Status  09/06/2021 Note  Final    Comment:    ==================================================================== [employer] Select 13 (MW) ==================================================================== Test  Result       Flag       Units  Drug Present and  Declared for Prescription Verification   Hydrocodone                    [phone]       EXPECTED   ng/mg creat   Hydromorphone                  1019         EXPECTED   ng/mg creat   Dihydrocodeine                 1089         EXPECTED   ng/mg creat   Norhydrocodone                 7886         EXPECTED   ng/mg creat    Sources of hydrocodone include scheduled prescription medications.    Hydromorphone, dihydrocodeine and norhydrocodone are expected    metabolites of hydrocodone. Hydromorphone and dihydrocodeine are    also available as scheduled prescription medications.  Drug Present not Declared for Prescription Verification   Desmethyldiazepam              40           UNEXPECTED ng/mg creat   Oxazepam                       194          UNEXPECTED ng/mg creat    Desmethyldiazepam and oxazepam are benzodiazepine drugs, but may    also be present as common metabolites of other benzodiazepine drugs,    including diazepam, chlordiazepoxide, prazepam, clorazepate, and    halazepam.  ==================================================================== Test                      Result    Flag   Units      Ref Range   Creatinine              63               mg/dL      >=60 ==================================================================== Declared Medications:  The flagging and interpretation on this report are based on the  following declared medications.  Unexpected results may arise from  inaccuracies in the declared medications.   **Note: The testing scope of this panel includes these medications:   Hydrocodone (Norco)   **Note: The testing scope of this panel does not include the  following reported medications:   Acetaminophen (Norco)  Aspirin  Calcitriol  Diltiazem  Insulin (NovoLog)  Metoprolol  Pancrelipase  Pravastatin  Sevelamer (Renvela)  Sodium Bicarbonate  Topical ==================================================================== For clinical consultation,  please call [phone]. ====================================================================      ROS  Constitutional: Denies any fever or chills Gastrointestinal: No reported hemesis, hematochezia, vomiting, or acute GI distress Musculoskeletal:  Bilateral leg and foot pain Neurological:  bilateral feet Paresthesias  Medication Review  HYDROcodone-acetaminophen, cinacalcet, diazepam, diltiazem, insulin aspart, insulin glargine, lidocaine-prilocaine, lipase/protease/amylase, metoprolol tartrate, ondansetron, and pravastatin  History Review  Allergy: Mr. Olund is allergic to enalapril maleate. Drug: Mr. Voda  reports no history of drug use. Alcohol:  reports no history of alcohol use. Tobacco:  reports that he has never smoked. He has never used smokeless tobacco. Social: Mr. Balgobin  reports that he has never smoked. He has never used smokeless  tobacco. He reports that he does not drink alcohol and does not use drugs. Medical:  has a past medical history of Arthritis, Chronic back pain, Chronic kidney disease, Coronary atherosclerosis of native coronary artery, Deafness in right ear, Diabetes mellitus, Diabetic retinopathy (HCC), Diastolic dysfunction (12/2011), Essential hypertension, benign, Heart murmur, HOH (hard of hearing), Hyperkalemia, Kidney stones, Mixed hyperlipidemia, NAFLD (nonalcoholic fatty liver disease), Pancreatic insufficiency, Prostate cancer (HCC) (2011), Shortness of breath dyspnea, Subdural hematoma (HCC), Type 2 diabetes mellitus (HCC), and Wears hearing aid. Surgical: Mr. Manwiller  has a past surgical history that includes Prostatectomy (2011); Hernia repair; Appendectomy; Cholecystectomy; Bilateral hip replacement; Cataract extraction w/PHACO (Left, 05/15/2015); Cataract extraction w/PHACO (Right, 08/28/2015); Insertion of dialysis catheter (Right, 01/22/2021); Insertion of dialysis catheter (Right, 04/16/2021); and AV fistula placement (Left, 05/15/2021). Family:  family history includes Diabetes type II in his mother; Heart attack in his father.  Laboratory Chemistry Profile   Renal Lab Results  Component Value Date   BUN 39 (H) 05/15/2021   CREATININE 3.00 (H) 05/15/2021   LABCREA 35.88 01/16/2021   BCR 19 04/12/2021   GFR 65.09 01/29/2017   GFRAA 42 (L) 12/30/2019   GFRNONAA 18 (L) 04/18/2021   GFRNONAA 17 (L) 04/18/2021    Hepatic Lab Results  Component Value Date   AST 16 04/18/2021   ALT 9 04/18/2021   ALBUMIN 2.0 (L) 04/18/2021   ALBUMIN 2.0 (L) 04/18/2021   ALKPHOS 143 (H) 04/18/2021   LIPASE 21 12/23/2020   AMMONIA 50 12/23/2019    Electrolytes Lab Results  Component Value Date   NA 136 05/15/2021   K 4.2 05/15/2021   CL 99 05/15/2021   CALCIUM 7.3 (L) 04/18/2021   CALCIUM 7.1 (L) 04/18/2021   MG 1.7 02/06/2021   PHOS 6.0 (H) 04/18/2021    Bone Lab Results  Component Value Date   VD25OH 25.27 (L) 12/28/2020   TESTOFREE 43.1 (L) 02/28/2015   TESTOSTERONE 295 11/25/2017    Inflammation (CRP: Acute Phase) (ESR: Chronic Phase) Lab Results  Component Value Date   CRP 14.5 (H) 01/09/2021   ESRSEDRATE 11 12/02/2018   LATICACIDVEN 1.2 04/13/2021         Note: Above Lab results reviewed.  Recent Imaging Review  VAS [place] ABI WITH/WO TBI  LOWER EXTREMITY DOPPLER STUDY  Patient Name:  QUINELL SIWICKI Soltis SR.  Date of Exam:   06/06/2021 Medical Rec #: [id number]             Accession #:    [id number] Date of Birth: Oct 03, 1940             Patient Gender: M Patient Age:   82 years Exam Location:  [facility] Procedure:      VAS [place] ABI WITH/WO TBI Referring Phys: Lynnea Ferrier  --------------------------------------------------------------------------------   Indications: Left leg pain.  High Risk Factors: Diabetes.  Other Factors: Calciphylaxis.  Performing Technologist: Thereasa Parkin RVT    Examination Guidelines: A complete evaluation includes at minimum, Doppler waveform signals and systolic  blood pressure reading at the level of bilateral brachial, anterior tibial, and posterior tibial arteries, when vessel segments are accessible. Bilateral testing is considered an integral part of a complete examination. Photoelectric Plethysmograph (PPG) waveforms and toe systolic pressure readings are included as required and additional duplex testing as needed. Limited examinations for reoccurring indications may be performed as noted.    ABI Findings: +---------+------------------+-----+----------+--------+ Right    Rt Pressure (mmHg)IndexWaveform  Comment  +---------+------------------+-----+----------+--------+ Brachial 130                                       +---------+------------------+-----+----------+--------+  PTA      255               1.96 biphasic           +---------+------------------+-----+----------+--------+ DP       255               1.96 monophasic         +---------+------------------+-----+----------+--------+ Great Toe255               1.96                    +---------+------------------+-----+----------+--------+  +---------+------------------+-----+--------+-------+ Left     Lt Pressure (mmHg)IndexWaveformComment +---------+------------------+-----+--------+-------+ PTA      255               1.96 biphasic        +---------+------------------+-----+--------+-------+ DP       255               1.96 biphasic        +---------+------------------+-----+--------+-------+ Great Toe253               1.95                 +---------+------------------+-----+--------+-------+  +-------+-----------+-----------+------------+------------+ ABI/TBIToday's ABIToday's TBIPrevious ABIPrevious TBI +-------+-----------+-----------+------------+------------+ Right  [place]         [place]                                  +-------+-----------+-----------+------------+------------+ Left   [place]         [place]                                   +-------+-----------+-----------+------------+------------+     No previous ABI.   Summary: Right: Resting right ankle-brachial index indicates noncompressible right lower extremity arteries. The right toe-brachial index is abnormal.  Left: Resting left ankle-brachial index indicates noncompressible left lower extremity arteries. The left toe-brachial index is abnormal.    *See table(s) above for measurements and observations.    Electronically signed by Gretta Began MD on 06/06/2021 at 3:02:04 PM.       Final   Note: Reviewed        Physical Exam  General appearance: Well nourished, well developed, and well hydrated. In no apparent acute distress Mental status: Alert, oriented x 3 (person, place, & time)       Respiratory: No evidence of acute respiratory distress Eyes: PERLA Vitals: BP 118/67   Pulse 82   Temp 98.2 F (36.8 C) (Temporal)   Resp 18   Ht 5\' 8"  (1.727 m)   Wt 143 lb (64.9 kg)   SpO2 100%   BMI 21.74 kg/m  BMI: Estimated body mass index is 21.74 kg/m as calculated from the following:   Height as of this encounter: 5\' 8"  (1.727 m).   Weight as of this encounter: 143 lb (64.9 kg). Ideal: Ideal body weight: 68.4 kg (150 lb 12.7 oz)  Assessment   Diagnosis Status  1. Chronic pain syndrome   2. 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 End stage renal disease (HCC)   4. Chronic painful diabetic neuropathy (HCC)   5. Type 2 diabetes mellitus with diabetic polyneuropathy, with long-term current use of insulin (HCC)   6. Encounter for long-term opiate analgesic use  7. Pain management contract signed      Controlled Controlled Controlled   Plan of Care    Mr. ALA DESAUTEL Sr. has a current medication list which includes the following long-term medication(s): diltiazem, insulin aspart, insulin glargine, metoprolol tartrate, and pravastatin.   Pharmacotherapy (Medications  Ordered): Meds ordered this encounter  Medications   HYDROcodone-acetaminophen (NORCO/VICODIN) 5-325 MG tablet    Sig: Take 1 tablet by mouth every 12 (twelve) hours as needed for severe pain. Must last 30 days.    Dispense:  60 tablet    Refill:  0    Chronic Pain: STOP Act (Not applicable) Fill 1 day early if closed on refill date. Avoid benzodiazepines within 8 hours of opioids   HYDROcodone-acetaminophen (NORCO/VICODIN) 5-325 MG tablet    Sig: Take 1 tablet by mouth every 12 (twelve) hours as needed for severe pain. Must last 30 days.    Dispense:  60 tablet    Refill:  0    Chronic Pain: STOP Act (Not applicable) Fill 1 day early if closed on refill date. Avoid benzodiazepines within 8 hours of opioids   HYDROcodone-acetaminophen (NORCO/VICODIN) 5-325 MG tablet    Sig: Take 1 tablet by mouth every 12 (twelve) hours as needed for severe pain. Must last 30 days.    Dispense:  60 tablet    Refill:  0    Chronic Pain: STOP Act (Not applicable) Fill 1 day early if closed on refill date. Avoid benzodiazepines within 8 hours of opioids   Orders Placed This Encounter  Procedures   NEUROLYSIS    Please order Qutenza patches from pharmacy    Standing Status:   Future    Standing Expiration Date:   04/02/2023    Order Specific Question:   Where will this procedure be performed?    Answer:   [facility]   ToxASSU[employer] Select 13 (MW), Urine    Volume: 30 ml(s). Minimum 3 ml of urine is needed. Document temperature of fresh sample. Indications: Long term (current) use of opiate analgesic (Z61.096)    Order Specific Question:   Release to patient    Answer:   Immediate     Follow-up plan:   Return in about 8 weeks (around 02/26/2023) for B/L Qutenza.    Recent Visits No visits were found meeting these conditions. Showing recent visits within past 90 days and meeting all other requirements Today's Visits Date Type Provider Dept  12/31/22 Office Visit Edward Jolly, MD [facility]  Showing today's visits and meeting all other requirements Future Appointments No visits were found meeting these conditions. Showing future appointments within next 90 days and meeting all other requirements  I discussed the assessment and treatment plan with the patient. The patient was provided an opportunity to ask questions and all were answered. The patient agreed with the plan and demonstrated an understanding of the instructions.  Patient advised to call back or seek an in-person evaluation if the symptoms or condition worsens.  Duration of encounter: .  Note by: Edward Jolly, MD Date: 12/31/2022; Time: 2:04 PM

## 2022-12-31 NOTE — Progress Notes (Signed)
Safety precautions to be maintained throughout the outpatient stay will include: orient to surroundings, keep bed in low position, maintain call bell within reach at all times, provide assistance with transfer out of bed and ambulation.   Nursing Pain Medication Assessment:  Safety precautions to be maintained throughout the outpatient stay will include: orient to surroundings, keep bed in low position, maintain call bell within reach at all times, provide assistance with transfer out of bed and ambulation.  Medication Inspection Compliance: Pill count conducted under aseptic conditions, in front of the patient. Neither the pills nor the bottle was removed from the patient's sight at any time. Once count was completed pills were immediately returned to the patient in their original bottle.  Medication: Hydrocodone/APAP Pill/Patch Count:  19 of 60 pills remain Pill/Patch Appearance: Markings consistent with prescribed medication Bottle Appearance: Standard pharmacy container. Clearly labeled. Filled Date: 05 / 07 / 2024 Last Medication intake:  Today

## 2023-01-02 ENCOUNTER — Encounter: Payer: PPO | Admitting: Student in an Organized Health Care Education/Training Program

## 2023-01-03 DIAGNOSIS — Z992 Dependence on renal dialysis: Secondary | ICD-10-CM | POA: Diagnosis not present

## 2023-01-03 DIAGNOSIS — E1122 Type 2 diabetes mellitus with diabetic chronic kidney disease: Secondary | ICD-10-CM | POA: Diagnosis not present

## 2023-01-03 DIAGNOSIS — N186 End stage renal disease: Secondary | ICD-10-CM | POA: Diagnosis not present

## 2023-01-03 LAB — TOXASSURE SELECT 13 (MW), URINE

## 2023-01-06 DIAGNOSIS — Z992 Dependence on renal dialysis: Secondary | ICD-10-CM | POA: Diagnosis not present

## 2023-01-06 DIAGNOSIS — E876 Hypokalemia: Secondary | ICD-10-CM | POA: Diagnosis not present

## 2023-01-06 DIAGNOSIS — N2581 Secondary hyperparathyroidism of renal origin: Secondary | ICD-10-CM | POA: Diagnosis not present

## 2023-01-06 DIAGNOSIS — D689 Coagulation defect, unspecified: Secondary | ICD-10-CM | POA: Diagnosis not present

## 2023-01-06 DIAGNOSIS — N186 End stage renal disease: Secondary | ICD-10-CM | POA: Diagnosis not present

## 2023-01-07 ENCOUNTER — Other Ambulatory Visit: Payer: PPO

## 2023-01-07 ENCOUNTER — Ambulatory Visit: Payer: PPO | Admitting: Podiatry

## 2023-01-07 ENCOUNTER — Encounter: Payer: Self-pay | Admitting: Podiatry

## 2023-01-07 DIAGNOSIS — M79674 Pain in right toe(s): Secondary | ICD-10-CM | POA: Diagnosis not present

## 2023-01-07 DIAGNOSIS — E1142 Type 2 diabetes mellitus with diabetic polyneuropathy: Secondary | ICD-10-CM

## 2023-01-07 DIAGNOSIS — B351 Tinea unguium: Secondary | ICD-10-CM

## 2023-01-07 DIAGNOSIS — Z794 Long term (current) use of insulin: Secondary | ICD-10-CM

## 2023-01-07 DIAGNOSIS — M79675 Pain in left toe(s): Secondary | ICD-10-CM

## 2023-01-07 LAB — GLUCOSE 16585: Glucose: 293 mg/dL — ABNORMAL HIGH (ref 65–99)

## 2023-01-07 NOTE — Progress Notes (Signed)
This patient returns to my office for at risk foot care.  This patient requires this care by a professional since this patient will be at risk due to having diabetes and CKD.  This patient is unable to cut nails himself since the patient cannot reach his nails.These nails are painful walking and wearing shoes.   He presents to the office with his wife.This patient presents for at risk foot care today.  General Appearance  Alert, conversant and in no acute stress.  Vascular  Dorsalis pedis and posterior tibial  pulses are  weakly palpable  bilaterally.  Capillary return is within normal limits  bilaterally. Temperature is within normal limits  bilaterally.  Neurologic  Senn-Weinstein monofilament wire test within normal limits  bilaterally. Muscle power within normal limits bilaterally.  Nails Thick disfigured discolored nails with subungual debris  from hallux to fifth toes bilaterally. No evidence of bacterial infection or drainage bilaterally.  Orthopedic  No limitations of motion  feet .  No crepitus or effusions noted.  HAV 1st MPJ left foot.  Skin  normotropic skin with no porokeratosis noted bilaterally.  No signs of infections or ulcers noted.     Onychomycosis  Pain in right toes  Pain in left toes  Consent was obtained for treatment procedures.   Mechanical debridement of nails 1-5  bilaterally performed with a nail nipper.  Filed with dremel without incident.    Return office visit      3 months                Told patient to return for periodic foot care and evaluation due to potential at risk complications.   Helane Gunther DPM

## 2023-01-13 DIAGNOSIS — Z992 Dependence on renal dialysis: Secondary | ICD-10-CM | POA: Diagnosis not present

## 2023-01-13 DIAGNOSIS — N2581 Secondary hyperparathyroidism of renal origin: Secondary | ICD-10-CM | POA: Diagnosis not present

## 2023-01-13 DIAGNOSIS — D689 Coagulation defect, unspecified: Secondary | ICD-10-CM | POA: Diagnosis not present

## 2023-01-13 DIAGNOSIS — E876 Hypokalemia: Secondary | ICD-10-CM | POA: Diagnosis not present

## 2023-01-13 DIAGNOSIS — N186 End stage renal disease: Secondary | ICD-10-CM | POA: Diagnosis not present

## 2023-01-13 DIAGNOSIS — D631 Anemia in chronic kidney disease: Secondary | ICD-10-CM | POA: Diagnosis not present

## 2023-01-20 DIAGNOSIS — Z992 Dependence on renal dialysis: Secondary | ICD-10-CM | POA: Diagnosis not present

## 2023-01-20 DIAGNOSIS — D689 Coagulation defect, unspecified: Secondary | ICD-10-CM | POA: Diagnosis not present

## 2023-01-20 DIAGNOSIS — E1122 Type 2 diabetes mellitus with diabetic chronic kidney disease: Secondary | ICD-10-CM | POA: Diagnosis not present

## 2023-01-20 DIAGNOSIS — N186 End stage renal disease: Secondary | ICD-10-CM | POA: Diagnosis not present

## 2023-01-20 DIAGNOSIS — N2581 Secondary hyperparathyroidism of renal origin: Secondary | ICD-10-CM | POA: Diagnosis not present

## 2023-01-25 ENCOUNTER — Other Ambulatory Visit: Payer: Self-pay

## 2023-01-25 ENCOUNTER — Emergency Department (HOSPITAL_COMMUNITY)
Admission: EM | Admit: 2023-01-25 | Discharge: 2023-01-25 | Disposition: A | Discharge status: Home / Self Care | Payer: PPO | Attending: Emergency Medicine | Admitting: Emergency Medicine

## 2023-01-25 ENCOUNTER — Other Ambulatory Visit: Payer: Self-pay | Admitting: Family Medicine

## 2023-01-25 ENCOUNTER — Encounter (HOSPITAL_COMMUNITY): Payer: Self-pay

## 2023-01-25 DIAGNOSIS — S0001XA Abrasion of scalp, initial encounter: Secondary | ICD-10-CM | POA: Diagnosis not present

## 2023-01-25 DIAGNOSIS — N186 End stage renal disease: Secondary | ICD-10-CM | POA: Insufficient documentation

## 2023-01-25 DIAGNOSIS — I10 Essential (primary) hypertension: Secondary | ICD-10-CM | POA: Diagnosis not present

## 2023-01-25 DIAGNOSIS — W19XXXA Unspecified fall, initial encounter: Secondary | ICD-10-CM | POA: Diagnosis not present

## 2023-01-25 DIAGNOSIS — S0990XA Unspecified injury of head, initial encounter: Secondary | ICD-10-CM | POA: Diagnosis not present

## 2023-01-25 DIAGNOSIS — R58 Hemorrhage, not elsewhere classified: Secondary | ICD-10-CM | POA: Diagnosis not present

## 2023-01-25 MED ORDER — HYDROGEN PEROXIDE 3 % EX SOLN
CUTANEOUS | Status: AC
  Administered 2023-01-25: 1
  Filled 2023-01-25: qty 473

## 2023-01-25 NOTE — Discharge Instructions (Addendum)
You were seen for your head trauma in the emergency department.  You have a scrape on the back of your head but it did not need stitches.  At home, please use Tylenol for any headaches that you have.    Check your MyChart online for the results of any tests that had not resulted by the time you left the emergency department.   Follow-up with your primary doctor in 2-3 days regarding your visit.  You may return to the emergency department at any time to have a head CT.  Return immediately to the emergency department if you experience any of the following: Severe headaches, vomiting, vision changes, or any other concerning symptoms.    Thank you for visiting our Emergency Department. It was a pleasure taking care of you today.

## 2023-01-25 NOTE — ED Provider Notes (Signed)
[place] EMERGENCY DEPARTMENT AT [facility] Provider Note   CSN: [id number] Arrival date & time: 01/25/23  1930     History {Add pertinent medical, surgical, social history, OB history to HPI:1} Chief Complaint  Patient presents with   Mark May Sr. is a 82 y.o. male.  82 year old male with a history of ESRD on IHD and subdural hematoma who presents emergency department with head trauma.  Patient was walking out of his car to get his walker when he experienced a mechanical fall.  Larey Seat and hit the back of his head.  No LOC.  Not on any blood thinners.  Did have an abrasion that was dressed with gauze and bleeding was controlled route.  Denies any other pain as result of his fall.  Says his last tetanus shot was 2 years ago.       Home Medications Prior to Admission medications   Medication Sig Start Date End Date Taking? Authorizing Provider  cinacalcet (SENSIPAR) 30 MG tablet SMARTSIG:1 Tablet(s) By Mouth Every Evening 09/04/21   [provider]  CREON 12000-38000 units CPEP capsule Take 1 capsule (12,000 Units total) by mouth 3 (three) times daily before meals. Take before evening meal 07/01/22   Donita Brooks, MD  diazepam (VALIUM) 2 MG tablet TAKE 1 TABLET(2 MG) BY MOUTH AT BEDTIME AS NEEDED FOR INSOMNIA 10/29/22   Donita Brooks, MD  diltiazem (CARDIZEM CD) 240 MG 24 hr capsule TAKE 1 CAPSULE(240 MG) BY MOUTH DAILY 09/30/22   Donita Brooks, MD  HYDROcodone-acetaminophen (NORCO/VICODIN) 5-325 MG tablet Take 1 tablet by mouth every 12 (twelve) hours as needed for severe pain. Must last 30 days. 01/09/23 02/08/23  Edward Jolly, MD  HYDROcodone-acetaminophen (NORCO/VICODIN) 5-325 MG tablet Take 1 tablet by mouth every 12 (twelve) hours as needed for severe pain. Must last 30 days. 02/08/23 03/10/23  Edward Jolly, MD  HYDROcodone-acetaminophen (NORCO/VICODIN) 5-325 MG tablet Take 1 tablet by mouth every 12 (twelve) hours as needed for severe pain.  Must last 30 days. 03/10/23 04/09/23  Edward Jolly, MD  insulin aspart (NOVOLOG) 100 UNIT/ML injection Inject 8 Units into the skin 3 (three) times daily with meals. Patient taking differently: Inject 6 Units into the skin in the morning. 02/06/21   Sherryll Burger, Pratik D, DO  insulin glargine (LANTUS) 100 UNIT/ML injection Inject 0.12 mLs (12 Units total) into the skin at bedtime. Patient taking differently: Inject 6 Units into the skin in the morning. 03/20/21   Donita Brooks, MD  lidocaine-prilocaine (EMLA) cream APPLY SMALL AMOUNT TO ACCESS SITE (AVF) 30 MINUTES BEFORE DIALYSIS. COVER WITH OCCLUSIVE DRESSING Jasper Riling WRAP) 04/23/22   Donita Brooks, MD  metoprolol tartrate (LOPRESSOR) 25 MG tablet Take 1 tablet (25 mg total) by mouth 2 (two) times daily. TAKE 1 TABLET(50 MG) BY MOUTH TWICE DAILY Strength: 50 mg 05/02/22   Donita Brooks, MD  ondansetron (ZOFRAN) 4 MG tablet Take 1 tablet (4 mg total) by mouth every 8 (eight) hours as needed for nausea or vomiting. 08/24/22   Avegno, Zachery Dakins, FNP  pravastatin (PRAVACHOL) 10 MG tablet TAKE 1 TABLET(10 MG) BY MOUTH DAILY WITH BREAKFAST 12/10/22   Donita Brooks, MD      Allergies    Enalapril maleate    Review of Systems   Review of Systems  Physical Exam Updated Vital Signs BP (!) 132/59   Pulse 78   Temp 98 F (36.7 C) (Oral)   Resp 17  Ht 5\' 8"  (1.727 m)   Wt 64.9 kg   SpO2 97%   BMI 21.74 kg/m  Physical Exam Constitutional:      General: He is not in acute distress.    Appearance: Normal appearance. He is not ill-appearing.  HENT:     Head: Normocephalic.     Comments: Abrasion occiput but no laceration that would be amenable to closure    Right Ear: External ear normal.     Left Ear: External ear normal.     Mouth/Throat:     Mouth: Mucous membranes are moist.     Pharynx: Oropharynx is clear.  Eyes:     Extraocular Movements: Extraocular movements intact.     Conjunctiva/sclera: Conjunctivae normal.     Pupils: Pupils  are equal, round, and reactive to light.  Neck:     Comments: No C-spine midline tenderness to palpation Pulmonary:     Effort: Pulmonary effort is normal. No respiratory distress.  Abdominal:     General: Abdomen is flat. Bowel sounds are normal.     Palpations: Abdomen is soft.     Tenderness: There is no abdominal tenderness. There is no guarding.  Musculoskeletal:        General: No deformity. Normal range of motion.     Cervical back: No rigidity or tenderness.     Comments: No tenderness to palpation of midline thoracic or lumbar spine.  No step-offs palpated.  No tenderness to palpation of chest wall.  No bruising noted.  No tenderness to palpation of bilateral clavicles.  No tenderness to palpation, bruising, or deformities noted of bilateral shoulders, elbows, wrists, hips, knees, or ankles.  Neurological:     General: No focal deficit present.     Mental Status: He is alert and oriented to person, place, and time. Mental status is at baseline.     Cranial Nerves: No cranial nerve deficit.     Sensory: No sensory deficit.     Motor: No weakness.     ED Results / Procedures / Treatments   Labs (all labs ordered are listed, but only abnormal results are displayed) Labs Reviewed - No data to display  EKG None  Radiology No results found.  Procedures Procedures  {Document cardiac monitor, telemetry assessment procedure when appropriate:1}  Medications Ordered in ED Medications  hydrogen peroxide 3 % external solution (1 Application  Given 01/25/23 2106)    ED Course/ Medical Decision Making/ A&P Clinical Course as of 01/25/23 2334  Sat Jan 25, 2023  2027 Accepted by Dr Suezanne Jacquet from [facility] has accepted the patient for ED to ED transfer.  [RP]    Clinical Course User Index [RP] Rondel Baton, MD   {   Click here for ABCD2, HEART and other calculatorsREFRESH Note before signing :1}                          Medical Decision Making Amount and/or  Complexity of Data Reviewed Radiology: ordered.   Mark L Bethel Sr. is a 82 y.o. male with comorbidities that complicate the patient evaluation including ESRD on dialysis and subdural hematoma presents emergency department head trauma after mechanical fall  Initial Ddx:  TBI, concussion, C-spine injury, fracture  MDM/Course:  Send the patient could potentially have a TBI or subdural hematoma in the setting of his fall given his history of ESRD which makes him coagulopathic and his age.  Unfortunately we do not have a CT  scanner here at this time so patient was arranged to be transported to [facility] for CT of the head as well as the C-spine but the patient stated that he was tired of waiting due to difficulty arranging transport.  Patient is requesting to leave AMA at this time.  Did discuss trying to follow-up with [facility] as soon as possible regarding a head CT and he states that he may try to go there in the morning.  Did discuss the risk of permanent disability or death with the patient and do feel that he has capacity to make this decision at this time.   This patient presents to the ED for concern of complaints listed in HPI, this involves an extensive number of treatment options, and is a complaint that carries with it a high risk of complications and morbidity. Disposition including potential need for admission considered.   Dispo: AMA  Additional history obtained from spouse Records reviewed Outpatient Clinic Notes I personally reviewed and interpreted the pt's EKG: see above for interpretation  Social Determinants of health:  Elderly    {Document critical care time when appropriate:1} {Document review of labs and clinical decision tools ie heart score, Chads2Vasc2 etc:1}  {Document your independent review of radiology images, and any outside records:1} {Document your discussion with family members, caretakers, and with consultants:1} {Document social determinants of  health affecting pt's care:1} {Document your decision making why or why not admission, treatments were needed:1} Final Clinical Impression(s) / ED Diagnoses Final diagnoses:  Fall, initial encounter  Injury of head, initial encounter    Rx / DC Orders ED Discharge Orders     None

## 2023-01-25 NOTE — ED Notes (Signed)
Pt HOH, deaf in right ear. Functioning hearing aid in left ear.

## 2023-01-25 NOTE — ED Triage Notes (Signed)
Pt to ED from home via [facility] after a mechanical fall. Pt was getting out of vehicle, attempting to open walker and tripped and fell, hitting back of head. Unclear LOC per pt and EMS. Bleeding controlled at this time. Pt a/o x4, dialysis pt, MWF, no missed appointments. No blood thinners per pt.

## 2023-01-27 DIAGNOSIS — D689 Coagulation defect, unspecified: Secondary | ICD-10-CM | POA: Diagnosis not present

## 2023-01-27 DIAGNOSIS — D509 Iron deficiency anemia, unspecified: Secondary | ICD-10-CM | POA: Diagnosis not present

## 2023-01-27 DIAGNOSIS — N186 End stage renal disease: Secondary | ICD-10-CM | POA: Diagnosis not present

## 2023-01-27 DIAGNOSIS — N2581 Secondary hyperparathyroidism of renal origin: Secondary | ICD-10-CM | POA: Diagnosis not present

## 2023-01-27 DIAGNOSIS — E876 Hypokalemia: Secondary | ICD-10-CM | POA: Diagnosis not present

## 2023-01-27 DIAGNOSIS — Z992 Dependence on renal dialysis: Secondary | ICD-10-CM | POA: Diagnosis not present

## 2023-01-27 NOTE — Telephone Encounter (Signed)
Requested Prescriptions  Pending Prescriptions Disp Refills   CREON 12000-38000 units CPEP capsule [Pharmacy Med Name: CREON 12,000UNIT CAPSULES] 90 capsule 3    Sig: TAKE ONE CAPSULE BY MOUTH THREE TIMES DAILY BEFORE MEALS, TAKE BEFORE EVENING MEAL     Off-Protocol Failed - 01/25/2023  3:19 PM      Failed - Medication not assigned to a protocol, review manually.      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 Pickard, Priscille Heidelberg, MD   1 year ago COVID-19   [facility] Pickard, Priscille Heidelberg, MD   2 years ago COVID-19   [facility] Pickard, Priscille Heidelberg, MD

## 2023-01-30 ENCOUNTER — Telehealth: Payer: Self-pay | Admitting: *Deleted

## 2023-01-30 NOTE — Telephone Encounter (Signed)
Transition Care Management Unsuccessful Follow-up Telephone Call  Date of discharge and from where:  [name] 01/25/2023  Attempts:  1st Attempt  Reason for unsuccessful TCM follow-up call:  Left voice message

## 2023-01-31 ENCOUNTER — Other Ambulatory Visit: Payer: Self-pay | Admitting: Family Medicine

## 2023-02-02 DIAGNOSIS — N186 End stage renal disease: Secondary | ICD-10-CM | POA: Diagnosis not present

## 2023-02-02 DIAGNOSIS — Z992 Dependence on renal dialysis: Secondary | ICD-10-CM | POA: Diagnosis not present

## 2023-02-02 DIAGNOSIS — E1122 Type 2 diabetes mellitus with diabetic chronic kidney disease: Secondary | ICD-10-CM | POA: Diagnosis not present

## 2023-02-03 ENCOUNTER — Telehealth: Payer: Self-pay | Admitting: *Deleted

## 2023-02-03 DIAGNOSIS — Z992 Dependence on renal dialysis: Secondary | ICD-10-CM | POA: Diagnosis not present

## 2023-02-03 DIAGNOSIS — D689 Coagulation defect, unspecified: Secondary | ICD-10-CM | POA: Diagnosis not present

## 2023-02-03 DIAGNOSIS — N2581 Secondary hyperparathyroidism of renal origin: Secondary | ICD-10-CM | POA: Diagnosis not present

## 2023-02-03 DIAGNOSIS — N186 End stage renal disease: Secondary | ICD-10-CM | POA: Diagnosis not present

## 2023-02-03 DIAGNOSIS — D509 Iron deficiency anemia, unspecified: Secondary | ICD-10-CM | POA: Diagnosis not present

## 2023-02-03 DIAGNOSIS — E876 Hypokalemia: Secondary | ICD-10-CM | POA: Diagnosis not present

## 2023-02-03 NOTE — Telephone Encounter (Signed)
Transition Care Management Unsuccessful Follow-up Telephone Call  Date of discharge and from where:  [name] 01/25/2023  Attempts:  2nd Attempt  Reason for unsuccessful TCM follow-up call:  Left voice message

## 2023-02-03 NOTE — Telephone Encounter (Signed)
Requested medication (s) are due for refill today: yes  Requested medication (s) are on the active medication list: yes  Last refill:  10/29/22 #30 2 RF  Future visit scheduled: no  Notes to clinic:  med not delegated to NT to RF   Requested Prescriptions  Pending Prescriptions Disp Refills   diazepam (VALIUM) 2 MG tablet [Pharmacy Med Name: DIAZEPAM 2MG  TABLETS] 30 tablet     Sig: TAKE 1 TABLET(2 MG) BY MOUTH AT BEDTIME AS NEEDED FOR INSOMNIA     Not Delegated - Psychiatry: Anxiolytics/Hypnotics 2 Failed - 01/31/2023 10:17 AM      Failed - This refill cannot be delegated      Failed - Urine Drug Screen completed in last 360 days      Failed - Valid encounter within last 6 months    Recent Outpatient Visits           1 year ago Need for immunization against influenza   [facility] Donita Brooks, MD   1 year ago Acute renal failure superimposed on stage 4 chronic kidney disease, unspecified acute renal failure type (HCC)   [facility] Donita Brooks, MD   1 year ago Calciphylaxis of lower extremity with nonhealing ulcer, limited to breakdown of skin (HCC)   [facility] Pickard, Priscille Heidelberg, MD   1 year ago COVID-19   [facility] Pickard, Priscille Heidelberg, MD   2 years ago COVID-19   [facility] Pickard, Priscille Heidelberg, MD              Passed - Patient is not pregnant

## 2023-02-04 DIAGNOSIS — Z992 Dependence on renal dialysis: Secondary | ICD-10-CM | POA: Diagnosis not present

## 2023-02-04 DIAGNOSIS — N2581 Secondary hyperparathyroidism of renal origin: Secondary | ICD-10-CM | POA: Diagnosis not present

## 2023-02-04 DIAGNOSIS — N186 End stage renal disease: Secondary | ICD-10-CM | POA: Diagnosis not present

## 2023-02-04 DIAGNOSIS — I10 Essential (primary) hypertension: Secondary | ICD-10-CM | POA: Diagnosis not present

## 2023-02-04 DIAGNOSIS — E1159 Type 2 diabetes mellitus with other circulatory complications: Secondary | ICD-10-CM | POA: Diagnosis not present

## 2023-02-04 DIAGNOSIS — Z794 Long term (current) use of insulin: Secondary | ICD-10-CM | POA: Diagnosis not present

## 2023-02-04 DIAGNOSIS — E1142 Type 2 diabetes mellitus with diabetic polyneuropathy: Secondary | ICD-10-CM | POA: Diagnosis not present

## 2023-02-04 DIAGNOSIS — E113293 Type 2 diabetes mellitus with mild nonproliferative diabetic retinopathy without macular edema, bilateral: Secondary | ICD-10-CM | POA: Diagnosis not present

## 2023-02-04 DIAGNOSIS — E1122 Type 2 diabetes mellitus with diabetic chronic kidney disease: Secondary | ICD-10-CM | POA: Diagnosis not present

## 2023-02-07 ENCOUNTER — Encounter: Payer: Self-pay | Admitting: Family Medicine

## 2023-02-10 ENCOUNTER — Other Ambulatory Visit: Payer: Self-pay | Admitting: Family Medicine

## 2023-02-10 DIAGNOSIS — R5383 Other fatigue: Secondary | ICD-10-CM

## 2023-02-10 DIAGNOSIS — R52 Pain, unspecified: Secondary | ICD-10-CM | POA: Diagnosis not present

## 2023-02-10 DIAGNOSIS — N186 End stage renal disease: Secondary | ICD-10-CM

## 2023-02-10 DIAGNOSIS — D689 Coagulation defect, unspecified: Secondary | ICD-10-CM | POA: Diagnosis not present

## 2023-02-10 DIAGNOSIS — D509 Iron deficiency anemia, unspecified: Secondary | ICD-10-CM | POA: Diagnosis not present

## 2023-02-10 DIAGNOSIS — D631 Anemia in chronic kidney disease: Secondary | ICD-10-CM | POA: Diagnosis not present

## 2023-02-10 DIAGNOSIS — E876 Hypokalemia: Secondary | ICD-10-CM | POA: Diagnosis not present

## 2023-02-10 DIAGNOSIS — N2581 Secondary hyperparathyroidism of renal origin: Secondary | ICD-10-CM | POA: Diagnosis not present

## 2023-02-10 DIAGNOSIS — Z992 Dependence on renal dialysis: Secondary | ICD-10-CM | POA: Diagnosis not present

## 2023-02-10 DIAGNOSIS — Z794 Long term (current) use of insulin: Secondary | ICD-10-CM

## 2023-02-11 ENCOUNTER — Other Ambulatory Visit: Payer: PPO

## 2023-02-11 DIAGNOSIS — R5383 Other fatigue: Secondary | ICD-10-CM

## 2023-02-11 DIAGNOSIS — N186 End stage renal disease: Secondary | ICD-10-CM | POA: Diagnosis not present

## 2023-02-11 DIAGNOSIS — Z794 Long term (current) use of insulin: Secondary | ICD-10-CM | POA: Diagnosis not present

## 2023-02-11 DIAGNOSIS — E1142 Type 2 diabetes mellitus with diabetic polyneuropathy: Secondary | ICD-10-CM | POA: Diagnosis not present

## 2023-02-13 ENCOUNTER — Other Ambulatory Visit: Payer: PPO

## 2023-02-14 LAB — CBC WITH DIFFERENTIAL/PLATELET
Basophils Absolute: 30 cells/uL (ref 0–200)
Hemoglobin: 8.6 g/dL — ABNORMAL LOW (ref 13.2–17.1)
Lymphs Abs: 1664 cells/uL (ref 850–3900)
MCHC: 32.2 g/dL (ref 32.0–36.0)
Monocytes Relative: 7.9 %
Neutro Abs: 5115 cells/uL (ref 1500–7800)
Neutrophils Relative %: 67.3 %
Platelets: 126 10*3/uL — ABNORMAL LOW (ref 140–400)
RBC: 2.63 10*6/uL — ABNORMAL LOW (ref 4.20–5.80)
RDW: 12.7 % (ref 11.0–15.0)

## 2023-02-15 LAB — COMPLETE METABOLIC PANEL WITH GFR
AG Ratio: 1.5 (calc) (ref 1.0–2.5)
ALT: 32 U/L (ref 9–46)
AST: 18 U/L (ref 10–35)
Albumin: 3 g/dL — ABNORMAL LOW (ref 3.6–5.1)
Alkaline phosphatase (APISO): 189 U/L — ABNORMAL HIGH (ref 35–144)
BUN/Creatinine Ratio: 10 (calc) (ref 6–22)
BUN: 44 mg/dL — ABNORMAL HIGH (ref 7–25)
CO2: 25 mmol/L (ref 20–32)
Calcium: 7.4 mg/dL — ABNORMAL LOW (ref 8.6–10.3)
Chloride: 106 mmol/L (ref 98–110)
Creat: 4.24 mg/dL — ABNORMAL HIGH (ref 0.70–1.22)
Globulin: 2 g/dL (calc) (ref 1.9–3.7)
Glucose, Bld: 141 mg/dL — ABNORMAL HIGH (ref 65–99)
Potassium: 4.1 mmol/L (ref 3.5–5.3)
Sodium: 141 mmol/L (ref 135–146)
Total Bilirubin: 0.4 mg/dL (ref 0.2–1.2)
Total Protein: 5 g/dL — ABNORMAL LOW (ref 6.1–8.1)
eGFR: 13 mL/min/{1.73_m2} — ABNORMAL LOW (ref >=60)

## 2023-02-15 LAB — CBC WITH DIFFERENTIAL/PLATELET
Absolute Monocytes: 600 cells/uL (ref 200–950)
Basophils Relative: 0.4 %
Eosinophils Absolute: 190 cells/uL (ref 15–500)
Eosinophils Relative: 2.5 %
HCT: 26.7 % — ABNORMAL LOW (ref 38.5–50.0)
MCH: 32.7 pg (ref 27.0–33.0)
MCV: 101.5 fL — ABNORMAL HIGH (ref 80.0–100.0)
MPV: 9.8 fL (ref 7.5–12.5)
Total Lymphocyte: 21.9 %
WBC: 7.6 10*3/uL (ref 3.8–10.8)

## 2023-02-15 LAB — LIPID PANEL
Cholesterol: 77 mg/dL (ref <200)
HDL: 44 mg/dL (ref >=40)
LDL Cholesterol (Calc): 20 mg/dL (calc)
Non-HDL Cholesterol (Calc): 33 mg/dL (calc) (ref <130)
Total CHOL/HDL Ratio: 1.8 (calc) (ref <5.0)
Triglycerides: 47 mg/dL (ref <150)

## 2023-02-15 LAB — HEMOGLOBIN A1C
Hgb A1c MFr Bld: 6.5 % of total Hgb — ABNORMAL HIGH (ref <5.7)
Mean Plasma Glucose: 140 mg/dL
eAG (mmol/L): 7.7 mmol/L

## 2023-02-15 LAB — TSH: TSH: 3.18 mIU/L (ref 0.40–4.50)

## 2023-02-15 LAB — VITAMIN B12: Vitamin B-12: 497 pg/mL (ref 200–1100)

## 2023-02-17 DIAGNOSIS — N186 End stage renal disease: Secondary | ICD-10-CM | POA: Diagnosis not present

## 2023-02-17 DIAGNOSIS — D689 Coagulation defect, unspecified: Secondary | ICD-10-CM | POA: Diagnosis not present

## 2023-02-17 DIAGNOSIS — D509 Iron deficiency anemia, unspecified: Secondary | ICD-10-CM | POA: Diagnosis not present

## 2023-02-17 DIAGNOSIS — E1122 Type 2 diabetes mellitus with diabetic chronic kidney disease: Secondary | ICD-10-CM | POA: Diagnosis not present

## 2023-02-17 DIAGNOSIS — N2581 Secondary hyperparathyroidism of renal origin: Secondary | ICD-10-CM | POA: Diagnosis not present

## 2023-02-17 DIAGNOSIS — Z992 Dependence on renal dialysis: Secondary | ICD-10-CM | POA: Diagnosis not present

## 2023-02-20 ENCOUNTER — Ambulatory Visit: Payer: PPO | Admitting: Family Medicine

## 2023-02-20 DIAGNOSIS — I871 Compression of vein: Secondary | ICD-10-CM | POA: Diagnosis not present

## 2023-02-20 DIAGNOSIS — T82858A Stenosis of vascular prosthetic devices, implants and grafts, initial encounter: Secondary | ICD-10-CM | POA: Diagnosis not present

## 2023-02-20 DIAGNOSIS — N186 End stage renal disease: Secondary | ICD-10-CM | POA: Diagnosis not present

## 2023-02-20 DIAGNOSIS — Z992 Dependence on renal dialysis: Secondary | ICD-10-CM | POA: Diagnosis not present

## 2023-02-24 DIAGNOSIS — D689 Coagulation defect, unspecified: Secondary | ICD-10-CM | POA: Diagnosis not present

## 2023-02-24 DIAGNOSIS — Z992 Dependence on renal dialysis: Secondary | ICD-10-CM | POA: Diagnosis not present

## 2023-02-24 DIAGNOSIS — E876 Hypokalemia: Secondary | ICD-10-CM | POA: Diagnosis not present

## 2023-02-24 DIAGNOSIS — D631 Anemia in chronic kidney disease: Secondary | ICD-10-CM | POA: Diagnosis not present

## 2023-02-24 DIAGNOSIS — D509 Iron deficiency anemia, unspecified: Secondary | ICD-10-CM | POA: Diagnosis not present

## 2023-02-24 DIAGNOSIS — N186 End stage renal disease: Secondary | ICD-10-CM | POA: Diagnosis not present

## 2023-02-24 DIAGNOSIS — N2581 Secondary hyperparathyroidism of renal origin: Secondary | ICD-10-CM | POA: Diagnosis not present

## 2023-02-25 ENCOUNTER — Encounter: Payer: Self-pay | Admitting: Student in an Organized Health Care Education/Training Program

## 2023-02-25 ENCOUNTER — Ambulatory Visit
Payer: PPO | Attending: Student in an Organized Health Care Education/Training Program | Admitting: Student in an Organized Health Care Education/Training Program

## 2023-02-25 VITALS — BP 114/67 | HR 76 | Temp 97.5°F | Resp 16 | Ht 68.0 in | Wt 149.0 lb

## 2023-02-25 DIAGNOSIS — L97923 Non-pressure chronic ulcer of unspecified part of left lower leg with necrosis of muscle: Secondary | ICD-10-CM | POA: Insufficient documentation

## 2023-02-25 DIAGNOSIS — N186 End stage renal disease: Secondary | ICD-10-CM | POA: Diagnosis not present

## 2023-02-25 DIAGNOSIS — G894 Chronic pain syndrome: Secondary | ICD-10-CM | POA: Diagnosis not present

## 2023-02-25 DIAGNOSIS — E1140 Type 2 diabetes mellitus with diabetic neuropathy, unspecified: Secondary | ICD-10-CM

## 2023-02-25 MED ORDER — CAPSAICIN-CLEANSING GEL 8 % EX KIT
4.0000 | PACK | Freq: Once | CUTANEOUS | Status: AC
  Administered 2023-02-25: 4 via TOPICAL
  Filled 2023-02-25: qty 4

## 2023-02-25 NOTE — Progress Notes (Signed)
Safety precautions to be maintained throughout the outpatient stay will include: orient to surroundings, keep bed in low position, maintain call bell within reach at all times, provide assistance with transfer out of bed and ambulation.  

## 2023-02-25 NOTE — Progress Notes (Signed)
PROVIDER NOTE: Interpretation of information contained herein should be left to medically-trained personnel. Specific patient instructions are provided elsewhere under "Patient Instructions" section of medical record. This document was created in part using STT-dictation technology, any transcriptional errors that may result from this process are unintentional.  Patient: Mark Art Sr. Type: Established DOB: [birth year] MRN: [id number] PCP: Donita Brooks, MD  Service: Procedure DOS: 02/25/2023 Setting: Ambulatory Location: Ambulatory outpatient facility Delivery: Face-to-face Provider: Edward Jolly, MD Specialty: Interventional Pain Management Specialty designation: 09 Location: Outpatient facility Ref. Prov.: Donita Brooks, MD       Interventional Therapy   Interventional Treatment:           Type: Qutenza Neurolysis #1  Laterality:  Bilateral Area treated: Feet Imaging Guidance: None Anesthesia/analgesia/anxiolysis/sedation: None required Medication (Right): Qutenza (capsaicin 8%) topical system Medication (Left): Qutenza (capsaicin 8%) topical system Date: 02/25/2023 Performed by: Edward Jolly, MD Rationale (medical necessity): procedure needed and proper for the treatment of Mr. Heisler medical symptoms and needs. Indication: Painful diabetic peripheral neuralgia (DPN) (ICD-10-CM:E11.40) severe enough to impact quality of life or function. 1. Chronic pain syndrome   2. 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 End stage renal disease (HCC)   4. Chronic painful diabetic neuropathy (HCC)    NAS-11 Pain score:   Pre-procedure: 0-No pain/10   Post-procedure: 0-No pain/10     Position / Prep / Materials:  Position: Supine  Materials: Qutenza Kit  H&P (Pre-op Assessment):  [name] is a 82 y.o. (year old), male patient, seen today for interventional treatment. He  has a past surgical history that includes  Prostatectomy (2011); Hernia repair; Appendectomy; Cholecystectomy; Bilateral hip replacement; Cataract extraction w/PHACO (Left, 05/15/2015); Cataract extraction w/PHACO (Right, 08/28/2015); Insertion of dialysis catheter (Right, 01/22/2021); Insertion of dialysis catheter (Right, 04/16/2021); and AV fistula placement (Left, 05/15/2021). [name] has a current medication list which includes the following prescription(s): cinacalcet, creon, diazepam, diltiazem, hydrocodone-acetaminophen, [START ON 03/10/2023] hydrocodone-acetaminophen, insulin aspart, insulin glargine, lidocaine-prilocaine, metoprolol tartrate, ondansetron, and pravastatin, and the following Facility-Administered Medications: capsaicin topical system. His primarily concern today is the Foot Pain (Bilateral )  Initial Vital Signs:  Pulse/HCG Rate: 82  Temp: (!) 97.5 F (36.4 C) Resp: 16 BP: 123/87 SpO2: 100 %  BMI: Estimated body mass index is 22.66 kg/m as calculated from the following:   Height as of this encounter: 5\' 8"  (1.727 m).   Weight as of this encounter: 149 lb (67.6 kg).  Risk Assessment: Allergies: Reviewed. He is allergic to enalapril maleate.  Allergy Precautions: None required Coagulopathies: Reviewed. None identified.  Blood-thinner therapy: None at this time Active Infection(s): Reviewed. None identified. [name] is afebrile  Site Confirmation: [name] was asked to confirm the procedure and laterality before marking the site Procedure checklist: Completed Consent: Before the procedure and under the influence of no sedative(s), amnesic(s), or anxiolytics, the patient was informed of the treatment options, risks and possible complications. To fulfill our ethical and legal obligations, as recommended by the American Medical Association's Code of Ethics, I have informed the patient of my clinical impression; the nature and purpose of the treatment or procedure; the risks, benefits, and possible  complications of the intervention; the alternatives, including doing nothing; the risk(s) and benefit(s) of the alternative treatment(s) or procedure(s); and the risk(s) and benefit(s) of doing nothing. The patient was provided information about the general risks and possible complications associated with the procedure. These may include,  but are not limited to: failure to achieve desired goals, infection, bleeding, organ or nerve damage, allergic reactions, paralysis, and death. In addition, the patient was informed of those risks and complications associated to the procedure, such as failure to decrease pain; infection; bleeding; organ or nerve damage with subsequent damage to sensory, motor, and/or autonomic systems, resulting in permanent pain, numbness, and/or weakness of one or several areas of the body; allergic reactions; (i.e.: anaphylactic reaction); and/or death. Furthermore, the patient was informed of those risks and complications associated with the medications. These include, but are not limited to: allergic reactions (i.e.: anaphylactic or anaphylactoid reaction(s)); adrenal axis suppression; blood sugar elevation that in diabetics may result in ketoacidosis or comma; water retention that in patients with history of congestive heart failure may result in shortness of breath, pulmonary edema, and decompensation with resultant heart failure; weight gain; swelling or edema; medication-induced neural toxicity; particulate matter embolism and blood vessel occlusion with resultant organ, and/or nervous system infarction; and/or aseptic necrosis of one or more joints. Finally, the patient was informed that Medicine is not an exact science; therefore, there is also the possibility of unforeseen or unpredictable risks and/or possible complications that may result in a catastrophic outcome. The patient indicated having understood very clearly. We have given the patient no guarantees and we have made no  promises. Enough time was given to the patient to ask questions, all of which were answered to the patient's satisfaction. [name] has indicated that he wanted to continue with the procedure. Attestation: I, the ordering provider, attest that I have discussed with the patient the benefits, risks, side-effects, alternatives, likelihood of achieving goals, and potential problems during recovery for the procedure that I have provided informed consent. Date  Time: 02/25/2023 12:48 PM  Pre-Procedure Preparation:  Monitoring: As per clinic protocol. Respiration, ETCO2, SpO2, BP, heart rate and rhythm monitor placed and checked for adequate function Safety Precautions: Patient was assessed for positional comfort and pressure points before starting the procedure. Time-out: I initiated and conducted the "Time-out" before starting the procedure, as per protocol. The patient was asked to participate by confirming the accuracy of the "Time Out" information. Verification of the correct person, site, and procedure were performed and confirmed by me, the nursing staff, and the patient. "Time-out" conducted as per Joint Commission's Universal Protocol (UP.01.01.01). Time: 1320 Start Time: 1322 hrs.  Description/Narrative of Procedure:          Region: Distal lower extremity Target Area: Sensory peripheral nerves affected by diabetic peripheral neuropathy Site: Feet Approach: Percutaneous  No./Series: Not applicable  Type: Percutaneous  Purpose: Therapeutic  Region: Distal lower extremities  Start Time: 1322 hrs.  Description of the Procedure: Protocol guidelines were followed. The patient was assisted into a comfortable position.  Informed consent was obtained in the patient monitored in the usual manner.  All questions were answered prior to the procedure.  They Qutenza patches were applied to the affected area and then covered with the wrap.  The Patient was kept under observation until the treatment  was completed.  The patches were removed and the treated area was inspected.  Vitals:   02/25/23 1305  BP: 123/87  Pulse: 82  Resp: 16  Temp: (!) 97.5 F (36.4 C)  TempSrc: Temporal  SpO2: 100%  Weight: 149 lb (67.6 kg)  Height: 5\' 8"  (1.727 m)     End Time:   hrs.  Type of Imaging Technique: None used Indication(s): N/A Exposure Time: No patient exposure  Contrast: None used. Fluoroscopic Guidance: N/A Ultrasound Guidance: N/A Interpretation: N/A  Post-operative Assessment:  Post-procedure Vital Signs:  Pulse/HCG Rate: 82  Temp: (!) 97.5 F (36.4 C) Resp: 16 BP: 123/87 SpO2: 100 %  EBL: None  Complications: No immediate post-treatment complications observed by team, or reported by patient.  Note: The patient tolerated the entire procedure well. A repeat set of vitals were taken after the procedure and the patient was kept under observation following institutional policy, for this type of procedure. Post-procedural neurological assessment was performed, showing return to baseline, prior to discharge. The patient was provided with post-procedure discharge instructions, including a section on how to identify potential problems. Should any problems arise concerning this procedure, the patient was given instructions to immediately contact us, at any time, without hesitation. In any case, we plan to contact the patient by telephone for a follow-up status report regarding this interventional procedure.  Comments:  No additional relevant information.  Plan of Care (POC)  Orders:  No orders of the defined types were placed in this encounter.  Chronic Opioid Analgesic:  Hydrocodone 5 mg every 8 hours as needed, #90/month MME= 15   Medications ordered for procedure: Meds ordered this encounter  Medications   capsaicin topical system 8 % patch 4 patch   Medications administered: Lathyn L. Klem Sr. had no medications administered during this visit.  See the medical record  for exact dosing, route, and time of administration.  Follow-up plan:   Return for Keep sch. appt.      Recent Visits Date Type Provider Dept  12/31/22 Office Visit Edward Jolly, MD [facility]  Showing recent visits within past 90 days and meeting all other requirements Today's Visits Date Type Provider Dept  02/25/23 Procedure visit Edward Jolly, MD [facility]  Showing today's visits and meeting all other requirements Future Appointments Date Type Provider Dept  04/03/23 Appointment Edward Jolly, MD [facility]  Showing future appointments within next 90 days and meeting all other requirements  Disposition: Discharge home  Discharge (Date  Time): 02/25/2023;   hrs.   Primary Care Physician: Donita Brooks, MD Location: [facility] Note by: Edward Jolly, MD (TTS technology used. I apologize for any typographical errors that were not detected and corrected.) Date: 02/25/2023; Time: 2:13 PM  Disclaimer:  Medicine is not an Visual merchandiser. The only guarantee in medicine is that nothing is guaranteed. It is important to note that the decision to proceed with this intervention was based on the information collected from the patient. The Data and conclusions were drawn from the patient's questionnaire, the interview, and the physical examination. Because the information was provided in large part by the patient, it cannot be guaranteed that it has not been purposely or unconsciously manipulated. Every effort has been made to obtain as much relevant data as possible for this evaluation. It is important to note that the conclusions that lead to this procedure are derived in large part from the available data. Always take into account that the treatment will also be dependent on availability of resources and existing treatment guidelines, considered by other Pain Management Practitioners as being common knowledge and practice, at the  time of the intervention. For Medico-Legal purposes, it is also important to point out that variation in procedural techniques and pharmacological choices are the acceptable norm. The indications, contraindications, technique, and results of the above procedure should only be interpreted and judged by a Board-Certified Interventional Pain Specialist  with extensive familiarity and expertise in the same exact procedure and technique.

## 2023-02-26 ENCOUNTER — Telehealth: Payer: Self-pay

## 2023-02-26 NOTE — Telephone Encounter (Signed)
Post procedure follow up.  Patient states he is doing well 

## 2023-02-27 ENCOUNTER — Ambulatory Visit (INDEPENDENT_AMBULATORY_CARE_PROVIDER_SITE_OTHER): Payer: PPO | Admitting: Family Medicine

## 2023-02-27 ENCOUNTER — Encounter: Payer: Self-pay | Admitting: Family Medicine

## 2023-02-27 VITALS — BP 124/70 | HR 88 | Temp 97.4°F | Ht 68.0 in | Wt 145.0 lb

## 2023-02-27 DIAGNOSIS — D649 Anemia, unspecified: Secondary | ICD-10-CM | POA: Diagnosis not present

## 2023-02-27 NOTE — Progress Notes (Signed)
Subjective:    Patient ID: [name]., male    DOB: [birth year], 82 y.o.   MRN: [id number]  HPI Wt Readings from Last 3 Encounters:  02/27/23 145 lb (65.8 kg)  02/25/23 149 lb (67.6 kg)  01/25/23 143 lb (64.9 kg)   Patient presents today complaining of profound fatigue.  He is an 82 year old gentleman who is on 3 times weekly dialysis for renal failure.  His recently was outstanding.  CMP obviously revealed an elevated creatinine as well expect.  B12 was normal.  However his level 1.6 is quite low even with the patient with renal failure.  He denies any chest pain or shortness of breath or dyspnea on exertion.  However he is having frequent episodes of hypotension associated with dialysis Past Medical History:  Diagnosis Date   Arthritis    lower back   Chronic back pain    Disc disease   Chronic kidney disease    Coronary atherosclerosis of native coronary artery    Coronary calcifications by chest CT, Myoview demonstrating inferolateral scar   Deafness in right ear    Diabetes mellitus    Diabetic retinopathy (HCC)    moderate nonproliferative with macular edema in right eye   Diastolic dysfunction 12/2011   Grade 1.   Essential hypertension, benign    Heart murmur    in past   HOH (hard of hearing)    Hyperkalemia    Kidney stones    Mixed hyperlipidemia    NAFLD (nonalcoholic fatty liver disease)    Pancreatic insufficiency    Diagnosed at [facility]   Prostate cancer [facility]) 2011   Shortness of breath dyspnea    occasional - liver pressing on right lung, decreased capacity   Subdural hematoma (HCC)    Type 2 diabetes mellitus (HCC)    Wears hearing aid    left   Past Surgical History:  Procedure Laterality Date   APPENDECTOMY     AV FISTULA PLACEMENT Left 05/15/2021   Procedure: INSERTION OF LEFT ARM ARTERIOVENOUS (AV) GORE-TEX GRAFT;  Surgeon: Larina Earthly, MD;  Location: [facility];  Service: Vascular;  Laterality: Left;   Bilateral hip replacement      CATARACT EXTRACTION W/PHACO Left 05/15/2015   Procedure: CATARACT EXTRACTION PHACO AND INTRAOCULAR LENS PLACEMENT (IOC);  Surgeon: Sherald Hess, MD;  Location: [facility];  Service: Ophthalmology;  Laterality: Left;  DIABETIC - insulin pump and oral meds   CATARACT EXTRACTION W/PHACO Right 08/28/2015   Procedure: CATARACT EXTRACTION PHACO AND INTRAOCULAR LENS PLACEMENT (IOC);  Surgeon: Sherald Hess, MD;  Location: [facility];  Service: Ophthalmology;  Laterality: Right;  DIABETIC PER PT AND CINDY PLEASE KEEP ARRIVAL TIME AFTER 8AM    CHOLECYSTECTOMY     HERNIA REPAIR     INSERTION OF DIALYSIS CATHETER Right 01/22/2021   Procedure: INSERTION OF DIALYSIS CATHETER;  Surgeon: Lucretia Roers, MD;  Location: [facility];  Service: General;  Laterality: Right;   INSERTION OF DIALYSIS CATHETER Right 04/16/2021   Procedure: INSERTION OF DIALYSIS CATHETER;  Surgeon: Lucretia Roers, MD;  Location: [facility];  Service: General;  Laterality: Right;   PROSTATECTOMY  2011   Current Outpatient Medications on File Prior to Visit  Medication Sig Dispense Refill   cinacalcet (SENSIPAR) 30 MG tablet SMARTSIG:1 Tablet(s) By Mouth Every Evening     CREON 12000-38000 units CPEP capsule Take 1 capsule (12,000 Units total) by mouth 3 (three) times daily before meals. Take before  evening meal 90 capsule 3   diazepam (VALIUM) 2 MG tablet TAKE 1 TABLET(2 MG) BY MOUTH AT BEDTIME AS NEEDED FOR INSOMNIA 30 tablet 1   diltiazem (CARDIZEM CD) 240 MG 24 hr capsule TAKE 1 CAPSULE(240 MG) BY MOUTH DAILY 90 capsule 3   HYDROcodone-acetaminophen (NORCO/VICODIN) 5-325 MG tablet Take 1 tablet by mouth every 12 (twelve) hours as needed for severe pain. Must last 30 days. 60 tablet 0   [START ON 03/10/2023] HYDROcodone-acetaminophen (NORCO/VICODIN) 5-325 MG tablet Take 1 tablet by mouth every 12 (twelve) hours as needed for severe pain. Must last 30 days. 60 tablet 0   insulin aspart (NOVOLOG) 100  UNIT/ML injection Inject 8 Units into the skin 3 (three) times daily with meals. (Patient taking differently: Inject 6 Units into the skin in the morning.) 10 mL 11   insulin glargine (LANTUS) 100 UNIT/ML injection Inject 0.12 mLs (12 Units total) into the skin at bedtime. (Patient taking differently: Inject 6 Units into the skin in the morning.) 10 mL 11   lidocaine-prilocaine (EMLA) cream APPLY SMALL AMOUNT TO ACCESS SITE (AVF) 30 MINUTES BEFORE DIALYSIS. COVER WITH OCCLUSIVE DRESSING (SARAN WRAP) 30 g 11   metoprolol tartrate (LOPRESSOR) 25 MG tablet Take 1 tablet (25 mg total) by mouth 2 (two) times daily. TAKE 1 TABLET(50 MG) BY MOUTH TWICE DAILY Strength: 50 mg 60 tablet 3   ondansetron (ZOFRAN) 4 MG tablet Take 1 tablet (4 mg total) by mouth every 8 (eight) hours as needed for nausea or vomiting. 20 tablet 0   pravastatin (PRAVACHOL) 10 MG tablet TAKE 1 TABLET(10 MG) BY MOUTH DAILY WITH BREAKFAST 90 tablet 0   No current facility-administered medications on file prior to visit.   Marland Kitchenall Social History   Socioeconomic History   Marital status: Married    Spouse name: Darlene   Number of children: 3   Years of education: Not on file   Highest education level: Not on file  Occupational History   Not on file  Tobacco Use   Smoking status: Never   Smokeless tobacco: Never  Vaping Use   Vaping status: Never Used  Substance and Sexual Activity   Alcohol use: No   Drug use: No   Sexual activity: Yes  Other Topics Concern   Not on file  Social History Narrative   3 children, 1 daughter and 1 son sorry deceased. 1 daughter living in [place] and 1 step child   Married x 13 years 08/25/21.   7 grandchildren   1 great grandchild   Social Determinants of Health   Financial Resource Strain: Low Risk  (08/22/2022)   Overall Financial Resource Strain (CARDIA)    Difficulty of Paying Living Expenses: Not hard at all  Food Insecurity: No Food Insecurity (08/22/2022)   Hunger Vital Sign     Worried About Running Out of Food in the Last Year: Never true    Ran Out of Food in the Last Year: Never true  Transportation Needs: No Transportation Needs (08/22/2022)   PRAPARE - Administrator, Civil Service (Medical): No    Lack of Transportation (Non-Medical): No  Physical Activity: Inactive (08/22/2022)   Exercise Vital Sign    Days of Exercise per Week: 0 days    Minutes of Exercise per Session: 0 min  Stress: No Stress Concern Present (08/22/2022)   [employer] of Occupational Health - Occupational Stress Questionnaire    Feeling of Stress : Not at all  Social Connections: Moderately Isolated (  08/22/2022)   Social Connection and Isolation Panel [NHANES]    Frequency of Communication with Friends and Family: More than three times a week    Frequency of Social Gatherings with Friends and Family: Three times a week    Attends Religious Services: Never    Active Member of Clubs or Organizations: No    Attends Banker Meetings: Never    Marital Status: Married  Catering manager Violence: Not At Risk (08/22/2022)   Humiliation, Afraid, Rape, and Kick questionnaire    Fear of Current or Ex-Partner: No    Emotionally Abused: No    Physically Abused: No    Sexually Abused: No      Review of Systems  All other systems reviewed and are negative.      Objective:   Physical Exam Constitutional:      General: He is not in acute distress.    Appearance: Normal appearance. He is normal weight. He is not ill-appearing or toxic-appearing.  Cardiovascular:     Rate and Rhythm: Normal rate. Rhythm irregular.     Heart sounds: Normal heart sounds.  Pulmonary:     Effort: Pulmonary effort is normal.     Breath sounds: Rales present.  Abdominal:     General: Abdomen is flat. Bowel sounds are normal.     Palpations: Abdomen is soft.  Musculoskeletal:     Right lower leg: No edema.     Left lower leg: No edema.  Skin:    Findings: No erythema or rash.   Neurological:     Mental Status: He is alert.        Assessment & Plan:  Anemia, unspecified type - Plan: Fecal Globin By Immunochemistry, Iron, Ferritin, Vitamin B12, TSH I believe the patient's fatigue is multifactorial but I believe his anemia is playing a role.  Also believe the hypertension is playing a role as well as age and chronic kidney disease.  Personal workup his anemia by checking his stool for blood and checking an iron level, ferritin, and TSH.  I have recommended that he discuss with his nephrologist possibly start him on erythropoietin if he is not already on that.  If they are unable to do that with dialysis, I will be glad to refer the patient to hematology.  Also recommended that he discontinue Cardizem

## 2023-02-28 ENCOUNTER — Other Ambulatory Visit: Payer: Self-pay | Admitting: Family Medicine

## 2023-02-28 DIAGNOSIS — D649 Anemia, unspecified: Secondary | ICD-10-CM | POA: Diagnosis not present

## 2023-03-03 DIAGNOSIS — D689 Coagulation defect, unspecified: Secondary | ICD-10-CM | POA: Diagnosis not present

## 2023-03-03 DIAGNOSIS — N2581 Secondary hyperparathyroidism of renal origin: Secondary | ICD-10-CM | POA: Diagnosis not present

## 2023-03-03 DIAGNOSIS — E876 Hypokalemia: Secondary | ICD-10-CM | POA: Diagnosis not present

## 2023-03-03 DIAGNOSIS — D509 Iron deficiency anemia, unspecified: Secondary | ICD-10-CM | POA: Diagnosis not present

## 2023-03-03 DIAGNOSIS — Z992 Dependence on renal dialysis: Secondary | ICD-10-CM | POA: Diagnosis not present

## 2023-03-03 DIAGNOSIS — N186 End stage renal disease: Secondary | ICD-10-CM | POA: Diagnosis not present

## 2023-03-05 DIAGNOSIS — Z992 Dependence on renal dialysis: Secondary | ICD-10-CM | POA: Diagnosis not present

## 2023-03-05 DIAGNOSIS — E1122 Type 2 diabetes mellitus with diabetic chronic kidney disease: Secondary | ICD-10-CM | POA: Diagnosis not present

## 2023-03-05 DIAGNOSIS — N186 End stage renal disease: Secondary | ICD-10-CM | POA: Diagnosis not present

## 2023-03-07 DIAGNOSIS — D689 Coagulation defect, unspecified: Secondary | ICD-10-CM | POA: Diagnosis not present

## 2023-03-07 DIAGNOSIS — N2581 Secondary hyperparathyroidism of renal origin: Secondary | ICD-10-CM | POA: Diagnosis not present

## 2023-03-07 DIAGNOSIS — N186 End stage renal disease: Secondary | ICD-10-CM | POA: Diagnosis not present

## 2023-03-07 DIAGNOSIS — Z992 Dependence on renal dialysis: Secondary | ICD-10-CM | POA: Diagnosis not present

## 2023-03-10 DIAGNOSIS — D631 Anemia in chronic kidney disease: Secondary | ICD-10-CM | POA: Diagnosis not present

## 2023-03-10 DIAGNOSIS — E876 Hypokalemia: Secondary | ICD-10-CM | POA: Diagnosis not present

## 2023-03-10 DIAGNOSIS — Z992 Dependence on renal dialysis: Secondary | ICD-10-CM | POA: Diagnosis not present

## 2023-03-10 DIAGNOSIS — N2581 Secondary hyperparathyroidism of renal origin: Secondary | ICD-10-CM | POA: Diagnosis not present

## 2023-03-10 DIAGNOSIS — D509 Iron deficiency anemia, unspecified: Secondary | ICD-10-CM | POA: Diagnosis not present

## 2023-03-10 DIAGNOSIS — D689 Coagulation defect, unspecified: Secondary | ICD-10-CM | POA: Diagnosis not present

## 2023-03-10 DIAGNOSIS — N186 End stage renal disease: Secondary | ICD-10-CM | POA: Diagnosis not present

## 2023-03-12 DIAGNOSIS — D649 Anemia, unspecified: Secondary | ICD-10-CM | POA: Insufficient documentation

## 2023-03-14 ENCOUNTER — Other Ambulatory Visit: Payer: Self-pay | Admitting: Family Medicine

## 2023-03-14 NOTE — Telephone Encounter (Signed)
Requested Prescriptions  Pending Prescriptions Disp Refills   pravastatin (PRAVACHOL) 10 MG tablet [Pharmacy Med Name: PRAVASTATIN 10MG  TABLETS] 90 tablet 0    Sig: TAKE 1 TABLET(10 MG) BY MOUTH DAILY WITH BREAKFAST     Cardiovascular:  Antilipid - Statins Failed - 03/14/2023  8:42 AM      Failed - Valid encounter within last 12 months    Recent Outpatient Visits           1 year ago Need for immunization against influenza   [facility] Donita Brooks, MD   1 year ago Acute renal failure superimposed on stage 4 chronic kidney disease, unspecified acute renal failure type (HCC)   [facility] Donita Brooks, MD   1 year ago Calciphylaxis of lower extremity with nonhealing ulcer, limited to breakdown of skin (HCC)   [facility] Pickard, Priscille Heidelberg, MD   1 year ago COVID-19   [facility] Pickard, Priscille Heidelberg, MD   2 years ago COVID-19   [facility] Tanya Nones, Priscille Heidelberg, MD              Failed - Lipid Panel in normal range within the last 12 months    Cholesterol  Date Value Ref Range Status  02/11/2023 77 <200 mg/dL Final   LDL Cholesterol (Calc)  Date Value Ref Range Status  02/11/2023 20 mg/dL (calc) Final    Comment:    Reference range: <100 . Desirable range <100 mg/dL for primary prevention;   <70 mg/dL for patients with CHD or diabetic patients  with > or = 2 CHD risk factors. Marland Kitchen LDL-C is now calculated using the Martin-Hopkins  calculation, which is a validated novel method providing  better accuracy than the Friedewald equation in the  estimation of LDL-C.  Horald Pollen et al. Lenox Ahr. 6962;952(84): 2061-2068  (http://education.[facility].com/faq/FAQ164)    Direct LDL  Date Value Ref Range Status  05/27/2016 54 <130 mg/dL Final    Comment:      Desirable range <100 mg/dL for patients with CHD or diabetes and <70 mg/dL for diabetic patients with known heart disease.      HDL   Date Value Ref Range Status  02/11/2023 44 > OR = 40 mg/dL Final   Triglycerides  Date Value Ref Range Status  02/11/2023 47 <150 mg/dL Final         Passed - Patient is not pregnant

## 2023-03-17 DIAGNOSIS — D509 Iron deficiency anemia, unspecified: Secondary | ICD-10-CM | POA: Diagnosis not present

## 2023-03-17 DIAGNOSIS — E876 Hypokalemia: Secondary | ICD-10-CM | POA: Diagnosis not present

## 2023-03-17 DIAGNOSIS — N2581 Secondary hyperparathyroidism of renal origin: Secondary | ICD-10-CM | POA: Diagnosis not present

## 2023-03-17 DIAGNOSIS — N186 End stage renal disease: Secondary | ICD-10-CM | POA: Diagnosis not present

## 2023-03-17 DIAGNOSIS — D689 Coagulation defect, unspecified: Secondary | ICD-10-CM | POA: Diagnosis not present

## 2023-03-17 DIAGNOSIS — Z992 Dependence on renal dialysis: Secondary | ICD-10-CM | POA: Diagnosis not present

## 2023-03-24 DIAGNOSIS — N2581 Secondary hyperparathyroidism of renal origin: Secondary | ICD-10-CM | POA: Diagnosis not present

## 2023-03-24 DIAGNOSIS — D509 Iron deficiency anemia, unspecified: Secondary | ICD-10-CM | POA: Diagnosis not present

## 2023-03-24 DIAGNOSIS — E039 Hypothyroidism, unspecified: Secondary | ICD-10-CM | POA: Diagnosis not present

## 2023-03-24 DIAGNOSIS — D689 Coagulation defect, unspecified: Secondary | ICD-10-CM | POA: Diagnosis not present

## 2023-03-24 DIAGNOSIS — Z992 Dependence on renal dialysis: Secondary | ICD-10-CM | POA: Diagnosis not present

## 2023-03-24 DIAGNOSIS — N186 End stage renal disease: Secondary | ICD-10-CM | POA: Diagnosis not present

## 2023-03-24 DIAGNOSIS — D631 Anemia in chronic kidney disease: Secondary | ICD-10-CM | POA: Diagnosis not present

## 2023-03-24 DIAGNOSIS — E1122 Type 2 diabetes mellitus with diabetic chronic kidney disease: Secondary | ICD-10-CM | POA: Diagnosis not present

## 2023-03-31 DIAGNOSIS — D631 Anemia in chronic kidney disease: Secondary | ICD-10-CM | POA: Diagnosis not present

## 2023-03-31 DIAGNOSIS — N186 End stage renal disease: Secondary | ICD-10-CM | POA: Diagnosis not present

## 2023-03-31 DIAGNOSIS — D689 Coagulation defect, unspecified: Secondary | ICD-10-CM | POA: Diagnosis not present

## 2023-03-31 DIAGNOSIS — Z992 Dependence on renal dialysis: Secondary | ICD-10-CM | POA: Diagnosis not present

## 2023-03-31 DIAGNOSIS — N2581 Secondary hyperparathyroidism of renal origin: Secondary | ICD-10-CM | POA: Diagnosis not present

## 2023-03-31 DIAGNOSIS — D509 Iron deficiency anemia, unspecified: Secondary | ICD-10-CM | POA: Diagnosis not present

## 2023-04-01 ENCOUNTER — Ambulatory Visit
Payer: PPO | Attending: Student in an Organized Health Care Education/Training Program | Admitting: Student in an Organized Health Care Education/Training Program

## 2023-04-01 ENCOUNTER — Encounter: Payer: Self-pay | Admitting: Student in an Organized Health Care Education/Training Program

## 2023-04-01 DIAGNOSIS — G894 Chronic pain syndrome: Secondary | ICD-10-CM | POA: Insufficient documentation

## 2023-04-01 DIAGNOSIS — L97923 Non-pressure chronic ulcer of unspecified part of left lower leg with necrosis of muscle: Secondary | ICD-10-CM | POA: Insufficient documentation

## 2023-04-01 DIAGNOSIS — E1140 Type 2 diabetes mellitus with diabetic neuropathy, unspecified: Secondary | ICD-10-CM | POA: Diagnosis not present

## 2023-04-01 DIAGNOSIS — Z79891 Long term (current) use of opiate analgesic: Secondary | ICD-10-CM | POA: Insufficient documentation

## 2023-04-01 DIAGNOSIS — Z0289 Encounter for other administrative examinations: Secondary | ICD-10-CM | POA: Insufficient documentation

## 2023-04-01 MED ORDER — HYDROCODONE-ACETAMINOPHEN 5-325 MG PO TABS: 1.0000 | ORAL_TABLET | Freq: Two times a day (BID) | ORAL | 0 refills | Status: DC | PRN

## 2023-04-01 MED ORDER — HYDROCODONE-ACETAMINOPHEN 5-325 MG PO TABS: 1.0000 | ORAL_TABLET | Freq: Two times a day (BID) | ORAL | 0 refills | Status: AC | PRN

## 2023-04-01 NOTE — Progress Notes (Signed)
Nursing Pain Medication Assessment:  Safety precautions to be maintained throughout the outpatient stay will include: orient to surroundings, keep bed in low position, maintain call bell within reach at all times, provide assistance with transfer out of bed and ambulation.  Medication Inspection Compliance: Pill count conducted under aseptic conditions, in front of the patient. Neither the pills nor the bottle was removed from the patient's sight at any time. Once count was completed pills were immediately returned to the patient in their original bottle.  Medication: Hydrocodone/APAP Pill/Patch Count:  19 of 60 pills remain Pill/Patch Appearance: Markings consistent with prescribed medication Bottle Appearance: Standard pharmacy container. Clearly labeled. Filled Date: 08 / 05 / 2024 Last Medication intake:  Yesterday

## 2023-04-01 NOTE — Progress Notes (Signed)
PROVIDER NOTE: Information contained herein reflects review and annotations entered in association with encounter. Interpretation of such information and data should be left to medically-trained personnel. Information provided to patient can be located elsewhere in the medical record under "Patient Instructions". Document created using STT-dictation technology, any transcriptional errors that may result from process are unintentional.    Patient: Mark Art Sr.  Service Category: E/M  Provider: Edward Jolly, MD  DOB: 07-27-1941  DOS: 04/01/2023  Specialty: Interventional Pain Management  MRN: [id number]  Setting: Ambulatory outpatient  PCP: Donita Brooks, MD  Type: Established Patient    Referring Provider: Donita Brooks, MD  Location: Office  Delivery: Face-to-face     HPI  [name], a 82 y.o. year old male, is here today because of his No primary diagnosis found.. Mr. Mayoral primary complain today is Foot Pain (Peripheral neuropathy ) Last encounter: My last encounter with him was on 02/25/23 Pertinent problems: [name] has End stage renal disease (HCC); Pain management contract signed; Calciphylaxis of left lower extremity with nonhealing ulcer with necrosis of muscle (HCC); and Chronic pain syndrome on their pertinent problem list. Pain Assessment: Severity of Chronic pain is reported as a 0-No pain/10. Location: Foot Left, Right/bottoms of feet. Onset: More than a month ago. Quality: Numbness, Sharp (sensation of having his shoes on all the time even when he doesn't). Timing: Intermittent. Modifying factor(s): qutenza helped. Vitals:  height is 5\' 8"  (1.727 m) and weight is 146 lb (66.2 kg). His temporal temperature is 97.3 F (36.3 C) (abnormal). His blood pressure is 141/72 (abnormal) and his pulse is 102 (abnormal). His respiration is 16 and oxygen saturation is 100%.   Reason for encounter: both, medication management and post-procedure evaluation and  assessment.    Elric presents today for medication management. He continues with dialysis. He found benefit with Qutenza treatment, detailed below, can repeat 3 months after July 23  Post-procedure evaluation   Type: Qutenza Neurolysis #1  Laterality:  Bilateral Area treated: Feet Imaging Guidance: None Anesthesia/analgesia/anxiolysis/sedation: None required Medication (Right): Qutenza (capsaicin 8%) topical system Medication (Left): Qutenza (capsaicin 8%) topical system Date: 02/25/2023 Performed by: Edward Jolly, MD Rationale (medical necessity): procedure needed and proper for the treatment of [name] medical symptoms and needs. Indication: Painful diabetic peripheral neuralgia (DPN) (ICD-10-CM:E11.40) severe enough to impact quality of life or function. 1. Chronic pain syndrome   2. 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 End stage renal disease (HCC)   4. Chronic painful diabetic neuropathy (HCC)    NAS-11 Pain score:   Pre-procedure: 0-No pain/10   Post-procedure: 0-No pain/10      Effectiveness:  Initial hour after procedure: 90 %  Subsequent 4-6 hours post-procedure: 90 %  Analgesia past initial 6 hours: 90 % (reports that the first time he had pain was on 03/18/23 in the right foot, the pain last approx 10 sec and it is gone.  left is doing well, just a few days ago he had 1 sharp pain that did not last very long.)  Ongoing improvement:  Analgesic:  80% Function: [name] reports improvement in function ROM: [name] reports improvement in ROM  Pharmacotherapy Assessment  Analgesic: Hydrocodone 5 mg every 12 hours as needed, #60/month MME= 15   Monitoring: [place] PMP: PDMP reviewed during this encounter.       Pharmacotherapy: No side-effects or adverse reactions reported. Compliance: No problems identified. Effectiveness:  Clinically acceptable.  Vernie Ammons, RN  04/01/2023  1:26 PM  Sign  when Signing Visit Nursing Pain Medication Assessment:  Safety precautions to be maintained throughout the outpatient stay will include: orient to surroundings, keep bed in low position, maintain call bell within reach at all times, provide assistance with transfer out of bed and ambulation.  Medication Inspection Compliance: Pill count conducted under aseptic conditions, in front of the patient. Neither the pills nor the bottle was removed from the patient's sight at any time. Once count was completed pills were immediately returned to the patient in their original bottle.  Medication: Hydrocodone/APAP Pill/Patch Count:  19 of 60 pills remain Pill/Patch Appearance: Markings consistent with prescribed medication Bottle Appearance: Standard pharmacy container. Clearly labeled. Filled Date: 08 / 05 / 2024 Last Medication intake:  Yesterday     UDS:  Summary  Date Value Ref Range Status  12/31/2022 Note  Final    Comment:    ==================================================================== [employer] Select 13 (MW) ==================================================================== Test                             Result       Flag       Units  Drug Present and Declared for Prescription Verification   Desmethyldiazepam              497          EXPECTED   ng/mg creat   Oxazepam                       1066         EXPECTED   ng/mg creat   Temazepam                      201          EXPECTED   ng/mg creat    Desmethyldiazepam, oxazepam, and temazepam are expected metabolites    of diazepam. Desmethyldiazepam and oxazepam are also expected    metabolites of other drugs, including chlordiazepoxide, prazepam,    clorazepate, and halazepam. Oxazepam is an expected metabolite of    temazepam. Oxazepam and temazepam are also available as scheduled    prescription medications.    Hydrocodone                    930          EXPECTED   ng/mg creat   Hydromorphone                  234           EXPECTED   ng/mg creat   Dihydrocodeine                 122          EXPECTED   ng/mg creat   Norhydrocodone                 605          EXPECTED   ng/mg creat    Sources of hydrocodone include scheduled prescription medications.    Hydromorphone, dihydrocodeine and norhydrocodone are expected    metabolites of hydrocodone. Hydromorphone and dihydrocodeine are    also available as scheduled prescription medications.  Drug Present not Declared for Prescription Verification   Carboxy-THC  30           UNEXPECTED ng/mg creat    Carboxy-THC is a metabolite of tetrahydrocannabinol (THC). Source of    THC is most commonly herbal marijuana or marijuana-based products,    but THC is also present in a scheduled prescription medication.    Trace amounts of THC can be present in hemp and cannabidiol (CBD)    products. This test is not intended to distinguish between delta-9-    tetrahydrocannabinol, the predominant form of THC in most herbal or    marijuana-based products, and delta-8-tetrahydrocannabinol.  ==================================================================== Test                      Result    Flag   Units      Ref Range   Creatinine              77               mg/dL      >=13 ==================================================================== Declared Medications:  The flagging and interpretation on this report are based on the  following declared medications.  Unexpected results may arise from  inaccuracies in the declared medications.   **Note: The testing scope of this panel includes these medications:   Diazepam (Valium)  Hydrocodone (Norco)   **Note: The testing scope of this panel does not include the  following reported medications:   Acetaminophen (Norco)  Cinacalcet (Sensipar)  Diltiazem (Cardizem)  Insulin (NovoLog)  Metoprolol (Lopressor)  Ondansetron (Zofran)  Pancrelipase (Creon)  Pravastatin (Pravachol)  Prilocaine (EMLA)  Topical  Lidocaine (EMLA) ==================================================================== For clinical consultation, please call [phone]. ====================================================================      ROS  Constitutional: Denies any fever or chills Gastrointestinal: No reported hemesis, hematochezia, vomiting, or acute GI distress Musculoskeletal:  Bilateral leg and foot pain Neurological:  bilateral feet Paresthesias  Medication Review  HYDROcodone-acetaminophen, cinacalcet, diltiazem, insulin aspart, insulin glargine, lidocaine-prilocaine, lipase/protease/amylase, ondansetron, and pravastatin  History Review  Allergy: Mr. Drotar is allergic to enalapril maleate. Drug: Mr. Whipp  reports no history of drug use. Alcohol:  reports no history of alcohol use. Tobacco:  reports that he has never smoked. He has never used smokeless tobacco. Social: Mr. Bartholf  reports that he has never smoked. He has never used smokeless tobacco. He reports that he does not drink alcohol and does not use drugs. Medical:  has a past medical history of Arthritis, Chronic back pain, Chronic kidney disease, Coronary atherosclerosis of native coronary artery, Deafness in right ear, Diabetes mellitus, Diabetic retinopathy (HCC), Diastolic dysfunction (12/2011), Essential hypertension, benign, Heart murmur, HOH (hard of hearing), Hyperkalemia, Kidney stones, Mixed hyperlipidemia, NAFLD (nonalcoholic fatty liver disease), Pancreatic insufficiency, Prostate cancer (HCC) (2011), Shortness of breath dyspnea, Subdural hematoma (HCC), Type 2 diabetes mellitus (HCC), and Wears hearing aid. Surgical: Mr. Warfield  has a past surgical history that includes Prostatectomy (2011); Hernia repair; Appendectomy; Cholecystectomy; Bilateral hip replacement; Cataract extraction w/PHACO (Left, 05/15/2015); Cataract extraction w/PHACO (Right, 08/28/2015); Insertion of dialysis catheter (Right, 01/22/2021); Insertion of  dialysis catheter (Right, 04/16/2021); and AV fistula placement (Left, 05/15/2021). Family: family history includes Diabetes type II in his mother; Heart attack in his father.  Laboratory Chemistry Profile   Renal Lab Results  Component Value Date   BUN 44 (H) 02/11/2023   CREATININE 4.24 (H) 02/11/2023   LABCREA 35.88 01/16/2021   BCR 10 02/11/2023   GFR 65.09 01/29/2017   GFRAA 42 (L) 12/30/2019   GFRNONAA 18 (L) 04/18/2021  GFRNONAA 17 (L) 04/18/2021    Hepatic Lab Results  Component Value Date   AST 18 02/11/2023   ALT 32 02/11/2023   ALBUMIN 2.0 (L) 04/18/2021   ALBUMIN 2.0 (L) 04/18/2021   ALKPHOS 143 (H) 04/18/2021   LIPASE 21 12/23/2020   AMMONIA 50 12/23/2019    Electrolytes Lab Results  Component Value Date   NA 141 02/11/2023   K 4.1 02/11/2023   CL 106 02/11/2023   CALCIUM 7.4 (L) 02/11/2023   MG 1.7 02/06/2021   PHOS 6.0 (H) 04/18/2021    Bone Lab Results  Component Value Date   VD25OH 25.27 (L) 12/28/2020   TESTOFREE 43.1 (L) 02/28/2015   TESTOSTERONE 295 11/25/2017    Inflammation (CRP: Acute Phase) (ESR: Chronic Phase) Lab Results  Component Value Date   CRP 14.5 (H) 01/09/2021   ESRSEDRATE 11 12/02/2018   LATICACIDVEN 1.2 04/13/2021         Note: Above Lab results reviewed.  Recent Imaging Review  VAS [place] ABI WITH/WO TBI  LOWER EXTREMITY DOPPLER STUDY  Patient Name:  KADEEM MCKISSIC Biela SR.  Date of Exam:   06/06/2021 Medical Rec #: [id number]             Accession #:    [id number] Date of Birth: [birth year]             Patient Gender: M Patient Age:   39 years Exam Location:  [facility] Procedure:      VAS [place] ABI WITH/WO TBI Referring Phys: Lynnea Ferrier  --------------------------------------------------------------------------------   Indications: Left leg pain.  High Risk Factors: Diabetes.  Other Factors: Calciphylaxis.  Performing Technologist: Thereasa Parkin RVT    Examination Guidelines: A complete  evaluation includes at minimum, Doppler waveform signals and systolic blood pressure reading at the level of bilateral brachial, anterior tibial, and posterior tibial arteries, when vessel segments are accessible. Bilateral testing is considered an integral part of a complete examination. Photoelectric Plethysmograph (PPG) waveforms and toe systolic pressure readings are included as required and additional duplex testing as needed. Limited examinations for reoccurring indications may be performed as noted.    ABI Findings: +---------+------------------+-----+----------+--------+ Right    Rt Pressure (mmHg)IndexWaveform  Comment  +---------+------------------+-----+----------+--------+ Brachial 130                                       +---------+------------------+-----+----------+--------+ PTA      255               1.96 biphasic           +---------+------------------+-----+----------+--------+ DP       255               1.96 monophasic         +---------+------------------+-----+----------+--------+ Great Toe255               1.96                    +---------+------------------+-----+----------+--------+  +---------+------------------+-----+--------+-------+ Left     Lt Pressure (mmHg)IndexWaveformComment +---------+------------------+-----+--------+-------+ PTA      255               1.96 biphasic        +---------+------------------+-----+--------+-------+ DP       255               1.96 biphasic        +---------+------------------+-----+--------+-------+  Great Toe253               1.95                 +---------+------------------+-----+--------+-------+  +-------+-----------+-----------+------------+------------+ ABI/TBIToday's ABIToday's TBIPrevious ABIPrevious TBI +-------+-----------+-----------+------------+------------+ Right  [place]         [place]                                   +-------+-----------+-----------+------------+------------+ Left   [place]         [place]                                  +-------+-----------+-----------+------------+------------+     No previous ABI.   Summary: Right: Resting right ankle-brachial index indicates noncompressible right lower extremity arteries. The right toe-brachial index is abnormal.  Left: Resting left ankle-brachial index indicates noncompressible left lower extremity arteries. The left toe-brachial index is abnormal.    *See table(s) above for measurements and observations.    Electronically signed by Gretta Began MD on 06/06/2021 at 3:02:04 PM.       Final   Note: Reviewed        Physical Exam  General appearance: Well nourished, well developed, and well hydrated. In no apparent acute distress Mental status: Alert, oriented x 3 (person, place, & time)       Respiratory: No evidence of acute respiratory distress Eyes: PERLA Vitals: BP (!) 141/72 (BP Location: Right Arm, Patient Position: Sitting, Cuff Size: Normal)   Pulse (!) 102   Temp (!) 97.3 F (36.3 C) (Temporal)   Resp 16   Ht 5\' 8"  (1.727 m)   Wt 146 lb (66.2 kg)   SpO2 100%   BMI 22.20 kg/m  BMI: Estimated body mass index is 22.2 kg/m as calculated from the following:   Height as of this encounter: 5\' 8"  (1.727 m).   Weight as of this encounter: 146 lb (66.2 kg). Ideal: Ideal body weight: 68.4 kg (150 lb 12.7 oz)  Assessment   Diagnosis Status  1. Chronic pain syndrome   2. 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 Encounter for long-term opiate analgesic use   4. Pain management contract signed   5. Chronic painful diabetic neuropathy (HCC)       Controlled Controlled Controlled   Plan of Care    Mr. CLAUDIUS WALDNER Sr. has a current medication list which includes the following long-term medication(s): diltiazem, insulin aspart, insulin glargine, and  pravastatin.   Pharmacotherapy (Medications Ordered): Meds ordered this encounter  Medications   HYDROcodone-acetaminophen (NORCO/VICODIN) 5-325 MG tablet    Sig: Take 1 tablet by mouth every 12 (twelve) hours as needed for severe pain. Must last 30 days.    Dispense:  60 tablet    Refill:  0    Chronic Pain: STOP Act (Not applicable) Fill 1 day early if closed on refill date. Avoid benzodiazepines within 8 hours of opioids   HYDROcodone-acetaminophen (NORCO/VICODIN) 5-325 MG tablet    Sig: Take 1 tablet by mouth every 12 (twelve) hours as needed for severe pain. Must last 30 days.    Dispense:  60 tablet    Refill:  0    Chronic Pain: STOP Act (Not applicable) Fill 1 day early if closed on refill date. Avoid benzodiazepines within 8  hours of opioids   HYDROcodone-acetaminophen (NORCO/VICODIN) 5-325 MG tablet    Sig: Take 1 tablet by mouth every 12 (twelve) hours as needed for severe pain. Must last 30 days.    Dispense:  60 tablet    Refill:  0    Chronic Pain: STOP Act (Not applicable) Fill 1 day early if closed on refill date. Avoid benzodiazepines within 8 hours of opioids   Orders Placed This Encounter  Procedures   NEUROLYSIS    Please order Qutenza patches from pharmacy    Standing Status:   Future    Standing Expiration Date:   07/02/2023    Order Specific Question:   Where will this procedure be performed?    Answer:   [facility]     Follow-up plan:   Return in about 3 months (around 07/10/2023) for Medication Management, in person.    Recent Visits Date Type Provider Dept  02/25/23 Procedure visit Edward Jolly, MD [facility]  Showing recent visits within past 90 days and meeting all other requirements Today's Visits Date Type Provider Dept  04/01/23 Office Visit Edward Jolly, MD [facility]  Showing today's visits and meeting all other requirements Future Appointments No visits were found meeting these conditions. Showing future  appointments within next 90 days and meeting all other requirements  I discussed the assessment and treatment plan with the patient. The patient was provided an opportunity to ask questions and all were answered. The patient agreed with the plan and demonstrated an understanding of the instructions.  Patient advised to call back or seek an in-person evaluation if the symptoms or condition worsens.  Duration of encounter: .  Note by: Edward Jolly, MD Date: 04/01/2023; Time: 1:45 PM

## 2023-04-03 ENCOUNTER — Encounter: Payer: PPO | Admitting: Student in an Organized Health Care Education/Training Program

## 2023-04-05 ENCOUNTER — Other Ambulatory Visit: Payer: Self-pay | Admitting: Family Medicine

## 2023-04-05 DIAGNOSIS — N186 End stage renal disease: Secondary | ICD-10-CM | POA: Diagnosis not present

## 2023-04-05 DIAGNOSIS — Z992 Dependence on renal dialysis: Secondary | ICD-10-CM | POA: Diagnosis not present

## 2023-04-05 DIAGNOSIS — E1122 Type 2 diabetes mellitus with diabetic chronic kidney disease: Secondary | ICD-10-CM | POA: Diagnosis not present

## 2023-04-07 DIAGNOSIS — N186 End stage renal disease: Secondary | ICD-10-CM | POA: Diagnosis not present

## 2023-04-07 DIAGNOSIS — D689 Coagulation defect, unspecified: Secondary | ICD-10-CM | POA: Diagnosis not present

## 2023-04-07 DIAGNOSIS — E876 Hypokalemia: Secondary | ICD-10-CM | POA: Diagnosis not present

## 2023-04-07 DIAGNOSIS — D509 Iron deficiency anemia, unspecified: Secondary | ICD-10-CM | POA: Diagnosis not present

## 2023-04-07 DIAGNOSIS — N2581 Secondary hyperparathyroidism of renal origin: Secondary | ICD-10-CM | POA: Diagnosis not present

## 2023-04-07 DIAGNOSIS — Z992 Dependence on renal dialysis: Secondary | ICD-10-CM | POA: Diagnosis not present

## 2023-04-08 NOTE — Telephone Encounter (Signed)
Requested medication (s) are due for refill today - no  Requested medication (s) are on the active medication list -no  Future visit scheduled -no  Last refill: 02/03/23  Notes to clinic: non delegated Rx, no longer listed on current medication list  Requested Prescriptions  Pending Prescriptions Disp Refills   diazepam (VALIUM) 2 MG tablet [Pharmacy Med Name: DIAZEPAM 2MG  TABLETS] 30 tablet     Sig: TAKE 1 TABLET BY MOUTH EVERY NIGHT AT BEDTIME AS NEEDED FOR INSOMNIA     Not Delegated - Psychiatry: Anxiolytics/Hypnotics 2 Failed - 04/05/2023  4:54 PM      Failed - This refill cannot be delegated      Failed - Urine Drug Screen completed in last 360 days      Failed - Valid encounter within last 6 months    Recent Outpatient Visits           1 year ago Need for immunization against influenza   [facility] Donita Brooks, MD   1 year ago Acute renal failure superimposed on stage 4 chronic kidney disease, unspecified acute renal failure type (HCC)   [facility] Donita Brooks, MD   2 years ago Calciphylaxis of lower extremity with nonhealing ulcer, limited to breakdown of skin (HCC)   [facility] Pickard, Priscille Heidelberg, MD   2 years ago COVID-19   [facility] Pickard, Priscille Heidelberg, MD   2 years ago COVID-19   [facility] Pickard, Priscille Heidelberg, MD              Passed - Patient is not pregnant         Requested Prescriptions  Pending Prescriptions Disp Refills   diazepam (VALIUM) 2 MG tablet [Pharmacy Med Name: DIAZEPAM 2MG  TABLETS] 30 tablet     Sig: TAKE 1 TABLET BY MOUTH EVERY NIGHT AT BEDTIME AS NEEDED FOR INSOMNIA     Not Delegated - Psychiatry: Anxiolytics/Hypnotics 2 Failed - 04/05/2023  4:54 PM      Failed - This refill cannot be delegated      Failed - Urine Drug Screen completed in last 360 days      Failed - Valid encounter within last 6 months    Recent Outpatient Visits            1 year ago Need for immunization against influenza   [facility] Donita Brooks, MD   1 year ago Acute renal failure superimposed on stage 4 chronic kidney disease, unspecified acute renal failure type (HCC)   [facility] Donita Brooks, MD   2 years ago Calciphylaxis of lower extremity with nonhealing ulcer, limited to breakdown of skin (HCC)   [facility] Pickard, Priscille Heidelberg, MD   2 years ago COVID-19   [facility] Pickard, Priscille Heidelberg, MD   2 years ago COVID-19   [facility] Pickard, Priscille Heidelberg, MD              Passed - Patient is not pregnant

## 2023-04-10 ENCOUNTER — Ambulatory Visit: Payer: PPO | Admitting: Podiatry

## 2023-04-14 DIAGNOSIS — N2581 Secondary hyperparathyroidism of renal origin: Secondary | ICD-10-CM | POA: Diagnosis not present

## 2023-04-14 DIAGNOSIS — D689 Coagulation defect, unspecified: Secondary | ICD-10-CM | POA: Diagnosis not present

## 2023-04-14 DIAGNOSIS — N186 End stage renal disease: Secondary | ICD-10-CM | POA: Diagnosis not present

## 2023-04-14 DIAGNOSIS — Z992 Dependence on renal dialysis: Secondary | ICD-10-CM | POA: Diagnosis not present

## 2023-04-14 DIAGNOSIS — E876 Hypokalemia: Secondary | ICD-10-CM | POA: Diagnosis not present

## 2023-04-14 DIAGNOSIS — D509 Iron deficiency anemia, unspecified: Secondary | ICD-10-CM | POA: Diagnosis not present

## 2023-04-14 DIAGNOSIS — R300 Dysuria: Secondary | ICD-10-CM | POA: Diagnosis not present

## 2023-04-17 ENCOUNTER — Ambulatory Visit: Payer: PPO | Admitting: Podiatry

## 2023-04-17 DIAGNOSIS — R300 Dysuria: Secondary | ICD-10-CM | POA: Insufficient documentation

## 2023-04-18 ENCOUNTER — Other Ambulatory Visit: Payer: Self-pay | Admitting: Family Medicine

## 2023-04-18 NOTE — Telephone Encounter (Signed)
Requested medication (s) are due for refill today: yes  Requested medication (s) are on the active medication list: yes  Last refill:  04/23/22  Future visit scheduled: no  Notes to clinic:   Medication not assigned to a protocol, review manually      Requested Prescriptions  Pending Prescriptions Disp Refills   lidocaine-prilocaine (EMLA) cream [Pharmacy Med Name: LIDOCAINE/PRILOCAINE CREAM 30GM] 30 g 11    Sig: APPLY SMALL AMOUNT TO ACCESS SITE 30 MINUTES BEFORE DIALYSIS, COVER WITH OCCLUSIVE DRESSING(SARAN WRAP)     Off-Protocol Failed - 04/18/2023  8:46 AM      Failed - Medication not assigned to a protocol, review manually.      Failed - Valid encounter within last 12 months    Recent Outpatient Visits           1 year ago Need for immunization against influenza   [facility] Donita Brooks, MD   2 years ago Acute renal failure superimposed on stage 4 chronic kidney disease, unspecified acute renal failure type (HCC)   [facility] Donita Brooks, MD   2 years ago Calciphylaxis of lower extremity with nonhealing ulcer, limited to breakdown of skin (HCC)   [facility] Pickard, Priscille Heidelberg, MD   2 years ago COVID-19   [facility] Pickard, Priscille Heidelberg, MD   2 years ago COVID-19   [facility] Pickard, Priscille Heidelberg, MD

## 2023-04-21 ENCOUNTER — Other Ambulatory Visit: Payer: Self-pay | Admitting: Family Medicine

## 2023-04-21 ENCOUNTER — Other Ambulatory Visit: Payer: Self-pay

## 2023-04-21 ENCOUNTER — Encounter: Payer: Self-pay | Admitting: Family Medicine

## 2023-04-21 MED ORDER — LIDOCAINE-PRILOCAINE 2.5-2.5 % EX CREA: TOPICAL_CREAM | CUTANEOUS | 11 refills | Status: DC

## 2023-04-22 DIAGNOSIS — T82868A Thrombosis of vascular prosthetic devices, implants and grafts, initial encounter: Secondary | ICD-10-CM | POA: Diagnosis not present

## 2023-04-22 DIAGNOSIS — D509 Iron deficiency anemia, unspecified: Secondary | ICD-10-CM | POA: Diagnosis not present

## 2023-04-22 DIAGNOSIS — N2581 Secondary hyperparathyroidism of renal origin: Secondary | ICD-10-CM | POA: Diagnosis not present

## 2023-04-22 DIAGNOSIS — D631 Anemia in chronic kidney disease: Secondary | ICD-10-CM | POA: Diagnosis not present

## 2023-04-22 DIAGNOSIS — T82858A Stenosis of vascular prosthetic devices, implants and grafts, initial encounter: Secondary | ICD-10-CM | POA: Diagnosis not present

## 2023-04-22 DIAGNOSIS — Z992 Dependence on renal dialysis: Secondary | ICD-10-CM | POA: Diagnosis not present

## 2023-04-22 DIAGNOSIS — E1122 Type 2 diabetes mellitus with diabetic chronic kidney disease: Secondary | ICD-10-CM | POA: Diagnosis not present

## 2023-04-22 DIAGNOSIS — N186 End stage renal disease: Secondary | ICD-10-CM | POA: Diagnosis not present

## 2023-04-22 DIAGNOSIS — D689 Coagulation defect, unspecified: Secondary | ICD-10-CM | POA: Diagnosis not present

## 2023-04-28 DIAGNOSIS — D689 Coagulation defect, unspecified: Secondary | ICD-10-CM | POA: Diagnosis not present

## 2023-04-28 DIAGNOSIS — E876 Hypokalemia: Secondary | ICD-10-CM | POA: Diagnosis not present

## 2023-04-28 DIAGNOSIS — N186 End stage renal disease: Secondary | ICD-10-CM | POA: Diagnosis not present

## 2023-04-28 DIAGNOSIS — Z992 Dependence on renal dialysis: Secondary | ICD-10-CM | POA: Diagnosis not present

## 2023-04-28 DIAGNOSIS — D509 Iron deficiency anemia, unspecified: Secondary | ICD-10-CM | POA: Diagnosis not present

## 2023-04-28 DIAGNOSIS — N2581 Secondary hyperparathyroidism of renal origin: Secondary | ICD-10-CM | POA: Diagnosis not present

## 2023-05-05 DIAGNOSIS — N186 End stage renal disease: Secondary | ICD-10-CM | POA: Diagnosis not present

## 2023-05-05 DIAGNOSIS — D689 Coagulation defect, unspecified: Secondary | ICD-10-CM | POA: Diagnosis not present

## 2023-05-05 DIAGNOSIS — Z992 Dependence on renal dialysis: Secondary | ICD-10-CM | POA: Diagnosis not present

## 2023-05-05 DIAGNOSIS — D509 Iron deficiency anemia, unspecified: Secondary | ICD-10-CM | POA: Diagnosis not present

## 2023-05-05 DIAGNOSIS — N2581 Secondary hyperparathyroidism of renal origin: Secondary | ICD-10-CM | POA: Diagnosis not present

## 2023-05-05 DIAGNOSIS — R197 Diarrhea, unspecified: Secondary | ICD-10-CM | POA: Diagnosis not present

## 2023-05-05 DIAGNOSIS — E1122 Type 2 diabetes mellitus with diabetic chronic kidney disease: Secondary | ICD-10-CM | POA: Diagnosis not present

## 2023-05-07 DIAGNOSIS — E876 Hypokalemia: Secondary | ICD-10-CM | POA: Diagnosis not present

## 2023-05-07 DIAGNOSIS — N186 End stage renal disease: Secondary | ICD-10-CM | POA: Diagnosis not present

## 2023-05-07 DIAGNOSIS — Z992 Dependence on renal dialysis: Secondary | ICD-10-CM | POA: Diagnosis not present

## 2023-05-07 DIAGNOSIS — N2581 Secondary hyperparathyroidism of renal origin: Secondary | ICD-10-CM | POA: Diagnosis not present

## 2023-05-07 DIAGNOSIS — D689 Coagulation defect, unspecified: Secondary | ICD-10-CM | POA: Diagnosis not present

## 2023-05-12 DIAGNOSIS — D689 Coagulation defect, unspecified: Secondary | ICD-10-CM | POA: Diagnosis not present

## 2023-05-12 DIAGNOSIS — D509 Iron deficiency anemia, unspecified: Secondary | ICD-10-CM | POA: Diagnosis not present

## 2023-05-12 DIAGNOSIS — N2581 Secondary hyperparathyroidism of renal origin: Secondary | ICD-10-CM | POA: Diagnosis not present

## 2023-05-12 DIAGNOSIS — N186 End stage renal disease: Secondary | ICD-10-CM | POA: Diagnosis not present

## 2023-05-12 DIAGNOSIS — E876 Hypokalemia: Secondary | ICD-10-CM | POA: Diagnosis not present

## 2023-05-12 DIAGNOSIS — Z992 Dependence on renal dialysis: Secondary | ICD-10-CM | POA: Diagnosis not present

## 2023-05-12 DIAGNOSIS — D631 Anemia in chronic kidney disease: Secondary | ICD-10-CM | POA: Diagnosis not present

## 2023-05-19 DIAGNOSIS — D689 Coagulation defect, unspecified: Secondary | ICD-10-CM | POA: Diagnosis not present

## 2023-05-19 DIAGNOSIS — E1122 Type 2 diabetes mellitus with diabetic chronic kidney disease: Secondary | ICD-10-CM | POA: Diagnosis not present

## 2023-05-19 DIAGNOSIS — Z992 Dependence on renal dialysis: Secondary | ICD-10-CM | POA: Diagnosis not present

## 2023-05-19 DIAGNOSIS — Z23 Encounter for immunization: Secondary | ICD-10-CM | POA: Diagnosis not present

## 2023-05-19 DIAGNOSIS — D631 Anemia in chronic kidney disease: Secondary | ICD-10-CM | POA: Diagnosis not present

## 2023-05-19 DIAGNOSIS — D509 Iron deficiency anemia, unspecified: Secondary | ICD-10-CM | POA: Diagnosis not present

## 2023-05-19 DIAGNOSIS — N186 End stage renal disease: Secondary | ICD-10-CM | POA: Diagnosis not present

## 2023-05-19 DIAGNOSIS — N2581 Secondary hyperparathyroidism of renal origin: Secondary | ICD-10-CM | POA: Diagnosis not present

## 2023-05-21 ENCOUNTER — Ambulatory Visit: Payer: PPO | Admitting: Urology

## 2023-05-21 VITALS — BP 143/85 | HR 125

## 2023-05-21 DIAGNOSIS — R310 Gross hematuria: Secondary | ICD-10-CM

## 2023-05-21 DIAGNOSIS — R3989 Other symptoms and signs involving the genitourinary system: Secondary | ICD-10-CM

## 2023-05-21 LAB — BLADDER SCAN AMB NON-IMAGING: Scan Result: 0

## 2023-05-21 MED ORDER — DOXYCYCLINE HYCLATE 100 MG PO CAPS: 100.0000 mg | ORAL_CAPSULE | Freq: Two times a day (BID) | ORAL | 0 refills | Status: DC

## 2023-05-21 NOTE — Progress Notes (Signed)
post void residual=2

## 2023-05-21 NOTE — Progress Notes (Signed)
05/21/2023 2:17 PM   Mark Art Sr. Aug 02, 1941 [id number]  Referring provider: Terrial Rhodes, MD [address] [place],  [place] [address]  No chief complaint on file.   HPI: Mr Litalien is a 82yo here for evaluation of gross hematuria and pneumaturia. Starting 3 days ago he developed gross hematuria. He developed cloudy urine and pneumaturia 3 weeks ago. He has ESRD and makes 30-50cc of urine daily. He has urinary urgency and pelvic pain. He has new urinary incontinence. He was treated for a UTI 3 weeks ago. He does not recall the antibiotic he was prescribed. IPSS 31 QOL 6.   PMH: Past Medical History:  Diagnosis Date   Arthritis    lower back   Chronic back pain    Disc disease   Chronic kidney disease    Coronary atherosclerosis of native coronary artery    Coronary calcifications by chest CT, Myoview demonstrating inferolateral scar   Deafness in right ear    Diabetes mellitus    Diabetic retinopathy (HCC)    moderate nonproliferative with macular edema in right eye   Diastolic dysfunction 12/2011   Grade 1.   Essential hypertension, benign    Heart murmur    in past   HOH (hard of hearing)    Hyperkalemia    Kidney stones    Mixed hyperlipidemia    NAFLD (nonalcoholic fatty liver disease)    Pancreatic insufficiency    Diagnosed at [facility]   Prostate cancer [facility]) 2011   Shortness of breath dyspnea    occasional - liver pressing on right lung, decreased capacity   Subdural hematoma (HCC)    Type 2 diabetes mellitus (HCC)    Wears hearing aid    left    Surgical History: Past Surgical History:  Procedure Laterality Date   APPENDECTOMY     AV FISTULA PLACEMENT Left 05/15/2021   Procedure: INSERTION OF LEFT ARM ARTERIOVENOUS (AV) GORE-TEX GRAFT;  Surgeon: Larina Earthly, MD;  Location: [facility];  Service: Vascular;  Laterality: Left;   Bilateral hip replacement     CATARACT EXTRACTION W/PHACO Left 05/15/2015   Procedure: CATARACT EXTRACTION PHACO AND  INTRAOCULAR LENS PLACEMENT (IOC);  Surgeon: Sherald Hess, MD;  Location: [facility];  Service: Ophthalmology;  Laterality: Left;  DIABETIC - insulin pump and oral meds   CATARACT EXTRACTION W/PHACO Right 08/28/2015   Procedure: CATARACT EXTRACTION PHACO AND INTRAOCULAR LENS PLACEMENT (IOC);  Surgeon: Sherald Hess, MD;  Location: [facility];  Service: Ophthalmology;  Laterality: Right;  DIABETIC PER PT AND CINDY PLEASE KEEP ARRIVAL TIME AFTER 8AM    CHOLECYSTECTOMY     HERNIA REPAIR     INSERTION OF DIALYSIS CATHETER Right 01/22/2021   Procedure: INSERTION OF DIALYSIS CATHETER;  Surgeon: Lucretia Roers, MD;  Location: [facility];  Service: General;  Laterality: Right;   INSERTION OF DIALYSIS CATHETER Right 04/16/2021   Procedure: INSERTION OF DIALYSIS CATHETER;  Surgeon: Lucretia Roers, MD;  Location: [facility];  Service: General;  Laterality: Right;   PROSTATECTOMY  2011    Home Medications:  Allergies as of 05/21/2023       Reactions   Enalapril Maleate Cough        Medication List        Accurate as of May 21, 2023  2:17 PM. If you have any questions, ask your nurse or doctor.          cinacalcet 30 MG tablet Commonly known as: SENSIPAR  SMARTSIG:1 Tablet(s) By Mouth Every Evening   Creon 12000-38000 units Cpep capsule Generic drug: lipase/protease/amylase Take 1 capsule (12,000 Units total) by mouth 3 (three) times daily before meals. Take before evening meal   diazepam 2 MG tablet Commonly known as: VALIUM TAKE 1 TABLET BY MOUTH EVERY NIGHT AT BEDTIME AS NEEDED FOR INSOMNIA   diltiazem 240 MG 24 hr capsule Commonly known as: CARDIZEM CD TAKE 1 CAPSULE(240 MG) BY MOUTH DAILY   HYDROcodone-acetaminophen 5-325 MG tablet Commonly known as: NORCO/VICODIN Take 1 tablet by mouth every 12 (twelve) hours as needed for severe pain. Must last 30 days.   HYDROcodone-acetaminophen 5-325 MG tablet Commonly known as:  NORCO/VICODIN Take 1 tablet by mouth every 12 (twelve) hours as needed for severe pain. Must last 30 days. Start taking on: June 10, 2023   insulin aspart 100 UNIT/ML injection Commonly known as: novoLOG Inject 8 Units into the skin 3 (three) times daily with meals. What changed:  how much to take when to take this   insulin glargine 100 UNIT/ML injection Commonly known as: LANTUS Inject 0.12 mLs (12 Units total) into the skin at bedtime. What changed:  how much to take when to take this   lidocaine-prilocaine cream Commonly known as: EMLA APPLY SMALL AMOUNT TO ACCESS SITE (AVF) 30 MINUTES BEFORE DIALYSIS. COVER WITH OCCLUSIVE DRESSING (SARAN WRAP)   ondansetron 4 MG tablet Commonly known as: Zofran Take 1 tablet (4 mg total) by mouth every 8 (eight) hours as needed for nausea or vomiting.   pravastatin 10 MG tablet Commonly known as: PRAVACHOL TAKE 1 TABLET(10 MG) BY MOUTH DAILY WITH BREAKFAST        Allergies:  Allergies  Allergen Reactions   Enalapril Maleate Cough    Family History: Family History  Problem Relation Age of Onset   Diabetes type II Mother    Heart attack Father     Social History:  reports that he has never smoked. He has never used smokeless tobacco. He reports that he does not drink alcohol and does not use drugs.  ROS: All other review of systems were reviewed and are negative except what is noted above in HPI  Physical Exam: BP (!) 143/85   Pulse (!) 125   Constitutional:  Alert and oriented, No acute distress. HEENT: [place] AT, moist mucus membranes.  Trachea midline, no masses. Cardiovascular: No clubbing, cyanosis, or edema. Respiratory: Normal respiratory effort, no increased work of breathing. GI: Abdomen is soft, nontender, nondistended, no abdominal masses GU: No CVA tenderness.  Lymph: No cervical or inguinal lymphadenopathy. Skin: No rashes, bruises or suspicious lesions. Neurologic: Grossly intact, no focal deficits,  moving all 4 extremities. Psychiatric: Normal mood and affect.  Laboratory Data: Lab Results  Component Value Date   WBC 7.6 02/11/2023   HGB 8.6 (L) 02/11/2023   HCT 26.7 (L) 02/11/2023   MCV 101.5 (H) 02/11/2023   PLT 126 (L) 02/11/2023    Lab Results  Component Value Date   CREATININE 4.24 (H) 02/11/2023    Lab Results  Component Value Date   PSA <0.1 01/07/2020   PSA <0.1 02/23/2019   PSA <0.1 04/23/2018    Lab Results  Component Value Date   TESTOSTERONE 295 11/25/2017    Lab Results  Component Value Date   HGBA1C 6.5 (H) 02/11/2023    Urinalysis    Component Value Date/Time   COLORURINE YELLOW 04/13/2021 1501   APPEARANCEUR HAZY (A) 04/13/2021 1501   LABSPEC >1.030 (H) 04/13/2021 1501  PHURINE 5.0 04/13/2021 1501   GLUCOSEU NEGATIVE 04/13/2021 1501   HGBUR TRACE (A) 04/13/2021 1501   BILIRUBINUR NEGATIVE 04/13/2021 1501   KETONESUR NEGATIVE 04/13/2021 1501   PROTEINUR 30 (A) 04/13/2021 1501   UROBILINOGEN 0.2 03/20/2013 2016   NITRITE NEGATIVE 04/13/2021 1501   LEUKOCYTESUR NEGATIVE 04/13/2021 1501    Lab Results  Component Value Date   BACTERIA NONE SEEN 04/13/2021    Pertinent Imaging: *** No results found for this or any previous visit.  No results found for this or any previous visit.  No results found for this or any previous visit.  No results found for this or any previous visit.  Results for orders placed during the hospital encounter of 04/13/21  US RENAL  Narrative CLINICAL DATA:  Acute renal injury.  Hypertension.  EXAM: RENAL / URINARY TRACT ULTRASOUND COMPLETE  COMPARISON:  Ultrasound renal 12/24/2020. CT abdomen and pelvis 03/20/2013.  FINDINGS: Right Kidney:  Renal measurements: 12.2 x 7.0 x 7.1 cm = volume: 316 mL. There is no hydronephrosis. Echogenicity is within normal limits. No focal lesion identified.  Left Kidney:  Renal measurements: 12.9 x 6.2 x 7.0 cm = volume: 292 mL. There is no  hydronephrosis. Echogenicity is within normal limits. No focal lesion identified.  Bladder:  The bilateral ureteral jets are not seen. Bladder wall appears diffusely thickened. There is some layering echogenic material in the dependent portion of the bladder.  Other:  None.  IMPRESSION: 1. No hydronephrosis. 2. Diffuse bladder wall thickening. Correlate for cystitis. There is layering echogenic material in the bladder which may indicate infection or hemorrhage.   Electronically Signed By: Darliss Cheney M.D. On: 04/13/2021 15:42  No valid procedures specified. No results found for this or any previous visit.  No results found for this or any previous visit.   Assessment & Plan:    1. Pneumaturia -likely related to UTI. We will send urine for culture and start doxycycline 100mg  BID for 14 days - BLADDER SCAN AMB NON-IMAGING  2. Gross hematuria -likely related to UTI. Followup 2 weeks with UA - Urine Culture   No follow-ups on file.  Wilkie Aye, MD  [facility] [place]

## 2023-05-24 LAB — URINE CULTURE

## 2023-05-26 ENCOUNTER — Telehealth: Payer: Self-pay

## 2023-05-26 DIAGNOSIS — N2581 Secondary hyperparathyroidism of renal origin: Secondary | ICD-10-CM | POA: Diagnosis not present

## 2023-05-26 DIAGNOSIS — Z992 Dependence on renal dialysis: Secondary | ICD-10-CM | POA: Diagnosis not present

## 2023-05-26 DIAGNOSIS — E876 Hypokalemia: Secondary | ICD-10-CM | POA: Diagnosis not present

## 2023-05-26 DIAGNOSIS — D509 Iron deficiency anemia, unspecified: Secondary | ICD-10-CM | POA: Diagnosis not present

## 2023-05-26 DIAGNOSIS — D689 Coagulation defect, unspecified: Secondary | ICD-10-CM | POA: Diagnosis not present

## 2023-05-26 DIAGNOSIS — N186 End stage renal disease: Secondary | ICD-10-CM | POA: Diagnosis not present

## 2023-05-26 MED ORDER — NITROFURANTOIN MONOHYD MACRO 100 MG PO CAPS: 100.0000 mg | ORAL_CAPSULE | Freq: Two times a day (BID) | ORAL | 0 refills | Status: DC

## 2023-05-26 NOTE — Telephone Encounter (Signed)
-----   Message from Wilkie Aye sent at 05/26/2023 12:46 PM EDT ----- Earma Reading 100mg  BID for 7 days ----- Message ----- From: Gustavus Messing, LPN Sent: [id number]/5621   8:44 AM EDT To: Malen Gauze, MD  No treatment started

## 2023-05-26 NOTE — Telephone Encounter (Signed)
Patient made aware of positive urine culture and abt sent to pharmacy

## 2023-05-28 ENCOUNTER — Inpatient Hospital Stay (HOSPITAL_COMMUNITY)
Admission: EM | Admit: 2023-05-28 | Discharge: 2023-06-03 | DRG: 853 | Disposition: A | Discharge status: Home Health | Payer: PPO | Attending: Internal Medicine | Admitting: Internal Medicine

## 2023-05-28 ENCOUNTER — Other Ambulatory Visit: Payer: Self-pay

## 2023-05-28 ENCOUNTER — Encounter (HOSPITAL_COMMUNITY): Payer: Self-pay | Admitting: *Deleted

## 2023-05-28 ENCOUNTER — Emergency Department (HOSPITAL_COMMUNITY): Payer: PPO

## 2023-05-28 DIAGNOSIS — N186 End stage renal disease: Secondary | ICD-10-CM | POA: Diagnosis not present

## 2023-05-28 DIAGNOSIS — E1169 Type 2 diabetes mellitus with other specified complication: Secondary | ICD-10-CM | POA: Diagnosis not present

## 2023-05-28 DIAGNOSIS — Z87442 Personal history of urinary calculi: Secondary | ICD-10-CM

## 2023-05-28 DIAGNOSIS — N201 Calculus of ureter: Secondary | ICD-10-CM | POA: Diagnosis not present

## 2023-05-28 DIAGNOSIS — I2510 Atherosclerotic heart disease of native coronary artery without angina pectoris: Secondary | ICD-10-CM | POA: Diagnosis present

## 2023-05-28 DIAGNOSIS — E872 Acidosis, unspecified: Secondary | ICD-10-CM | POA: Diagnosis present

## 2023-05-28 DIAGNOSIS — I10 Essential (primary) hypertension: Secondary | ICD-10-CM | POA: Diagnosis present

## 2023-05-28 DIAGNOSIS — N1330 Unspecified hydronephrosis: Secondary | ICD-10-CM | POA: Diagnosis not present

## 2023-05-28 DIAGNOSIS — E162 Hypoglycemia, unspecified: Secondary | ICD-10-CM | POA: Diagnosis not present

## 2023-05-28 DIAGNOSIS — N250 Renal osteodystrophy: Secondary | ICD-10-CM | POA: Diagnosis not present

## 2023-05-28 DIAGNOSIS — Z8249 Family history of ischemic heart disease and other diseases of the circulatory system: Secondary | ICD-10-CM

## 2023-05-28 DIAGNOSIS — Z79899 Other long term (current) drug therapy: Secondary | ICD-10-CM

## 2023-05-28 DIAGNOSIS — R6520 Severe sepsis without septic shock: Secondary | ICD-10-CM | POA: Diagnosis present

## 2023-05-28 DIAGNOSIS — Z794 Long term (current) use of insulin: Secondary | ICD-10-CM

## 2023-05-28 DIAGNOSIS — N390 Urinary tract infection, site not specified: Secondary | ICD-10-CM | POA: Insufficient documentation

## 2023-05-28 DIAGNOSIS — E8770 Fluid overload, unspecified: Secondary | ICD-10-CM | POA: Diagnosis not present

## 2023-05-28 DIAGNOSIS — N136 Pyonephrosis: Secondary | ICD-10-CM | POA: Diagnosis present

## 2023-05-28 DIAGNOSIS — I120 Hypertensive chronic kidney disease with stage 5 chronic kidney disease or end stage renal disease: Secondary | ICD-10-CM | POA: Diagnosis not present

## 2023-05-28 DIAGNOSIS — Z8546 Personal history of malignant neoplasm of prostate: Secondary | ICD-10-CM

## 2023-05-28 DIAGNOSIS — K760 Fatty (change of) liver, not elsewhere classified: Secondary | ICD-10-CM | POA: Diagnosis not present

## 2023-05-28 DIAGNOSIS — K8689 Other specified diseases of pancreas: Secondary | ICD-10-CM | POA: Diagnosis not present

## 2023-05-28 DIAGNOSIS — M549 Dorsalgia, unspecified: Secondary | ICD-10-CM | POA: Diagnosis not present

## 2023-05-28 DIAGNOSIS — I440 Atrioventricular block, first degree: Secondary | ICD-10-CM | POA: Diagnosis not present

## 2023-05-28 DIAGNOSIS — M898X9 Other specified disorders of bone, unspecified site: Secondary | ICD-10-CM | POA: Diagnosis not present

## 2023-05-28 DIAGNOSIS — A4151 Sepsis due to Escherichia coli [E. coli]: Principal | ICD-10-CM | POA: Diagnosis present

## 2023-05-28 DIAGNOSIS — E1122 Type 2 diabetes mellitus with diabetic chronic kidney disease: Secondary | ICD-10-CM | POA: Diagnosis present

## 2023-05-28 DIAGNOSIS — E11649 Type 2 diabetes mellitus with hypoglycemia without coma: Secondary | ICD-10-CM | POA: Diagnosis present

## 2023-05-28 DIAGNOSIS — I959 Hypotension, unspecified: Secondary | ICD-10-CM | POA: Diagnosis present

## 2023-05-28 DIAGNOSIS — D631 Anemia in chronic kidney disease: Secondary | ICD-10-CM | POA: Diagnosis not present

## 2023-05-28 DIAGNOSIS — I700 Atherosclerosis of aorta: Secondary | ICD-10-CM | POA: Diagnosis not present

## 2023-05-28 DIAGNOSIS — E11311 Type 2 diabetes mellitus with unspecified diabetic retinopathy with macular edema: Secondary | ICD-10-CM | POA: Diagnosis not present

## 2023-05-28 DIAGNOSIS — C61 Malignant neoplasm of prostate: Secondary | ICD-10-CM | POA: Diagnosis present

## 2023-05-28 DIAGNOSIS — H9191 Unspecified hearing loss, right ear: Secondary | ICD-10-CM | POA: Diagnosis present

## 2023-05-28 DIAGNOSIS — E1142 Type 2 diabetes mellitus with diabetic polyneuropathy: Secondary | ICD-10-CM | POA: Diagnosis not present

## 2023-05-28 DIAGNOSIS — E785 Hyperlipidemia, unspecified: Secondary | ICD-10-CM | POA: Diagnosis not present

## 2023-05-28 DIAGNOSIS — E871 Hypo-osmolality and hyponatremia: Secondary | ICD-10-CM | POA: Diagnosis present

## 2023-05-28 DIAGNOSIS — Z888 Allergy status to other drugs, medicaments and biological substances status: Secondary | ICD-10-CM

## 2023-05-28 DIAGNOSIS — Z79891 Long term (current) use of opiate analgesic: Secondary | ICD-10-CM

## 2023-05-28 DIAGNOSIS — A419 Sepsis, unspecified organism: Principal | ICD-10-CM | POA: Diagnosis present

## 2023-05-28 DIAGNOSIS — I471 Supraventricular tachycardia, unspecified: Secondary | ICD-10-CM | POA: Diagnosis present

## 2023-05-28 DIAGNOSIS — N3000 Acute cystitis without hematuria: Secondary | ICD-10-CM

## 2023-05-28 DIAGNOSIS — Z992 Dependence on renal dialysis: Secondary | ICD-10-CM | POA: Diagnosis not present

## 2023-05-28 DIAGNOSIS — R0989 Other specified symptoms and signs involving the circulatory and respiratory systems: Secondary | ICD-10-CM | POA: Diagnosis not present

## 2023-05-28 DIAGNOSIS — N132 Hydronephrosis with renal and ureteral calculous obstruction: Secondary | ICD-10-CM | POA: Diagnosis not present

## 2023-05-28 DIAGNOSIS — G894 Chronic pain syndrome: Secondary | ICD-10-CM

## 2023-05-28 DIAGNOSIS — R0902 Hypoxemia: Secondary | ICD-10-CM | POA: Diagnosis not present

## 2023-05-28 DIAGNOSIS — E782 Mixed hyperlipidemia: Secondary | ICD-10-CM | POA: Diagnosis present

## 2023-05-28 DIAGNOSIS — Z0289 Encounter for other administrative examinations: Secondary | ICD-10-CM

## 2023-05-28 DIAGNOSIS — R109 Unspecified abdominal pain: Secondary | ICD-10-CM | POA: Diagnosis not present

## 2023-05-28 DIAGNOSIS — Z833 Family history of diabetes mellitus: Secondary | ICD-10-CM

## 2023-05-28 DIAGNOSIS — I499 Cardiac arrhythmia, unspecified: Secondary | ICD-10-CM | POA: Diagnosis not present

## 2023-05-28 DIAGNOSIS — E1140 Type 2 diabetes mellitus with diabetic neuropathy, unspecified: Secondary | ICD-10-CM

## 2023-05-28 DIAGNOSIS — Z96643 Presence of artificial hip joint, bilateral: Secondary | ICD-10-CM | POA: Diagnosis present

## 2023-05-28 DIAGNOSIS — R000 Tachycardia, unspecified: Secondary | ICD-10-CM | POA: Diagnosis not present

## 2023-05-28 LAB — COMPREHENSIVE METABOLIC PANEL
ALT: 39 U/L (ref 0–44)
AST: 35 U/L (ref 15–41)
Albumin: 2.1 g/dL — ABNORMAL LOW (ref 3.5–5.0)
Alkaline Phosphatase: 356 U/L — ABNORMAL HIGH (ref 38–126)
Anion gap: 16 — ABNORMAL HIGH (ref 5–15)
BUN: 14 mg/dL (ref 8–23)
CO2: 24 mmol/L (ref 22–32)
Calcium: 8.3 mg/dL — ABNORMAL LOW (ref 8.9–10.3)
Chloride: 94 mmol/L — ABNORMAL LOW (ref 98–111)
Creatinine, Ser: 2.19 mg/dL — ABNORMAL HIGH (ref 0.61–1.24)
GFR, Estimated: 29 mL/min — ABNORMAL LOW (ref >60)
Glucose, Bld: 161 mg/dL — ABNORMAL HIGH (ref 70–99)
Potassium: 3.9 mmol/L (ref 3.5–5.1)
Sodium: 134 mmol/L — ABNORMAL LOW (ref 135–145)
Total Bilirubin: 1.2 mg/dL (ref 0.3–1.2)
Total Protein: 5.4 g/dL — ABNORMAL LOW (ref 6.5–8.1)

## 2023-05-28 LAB — CBC WITH DIFFERENTIAL/PLATELET
Abs Immature Granulocytes: 0.22 10*3/uL — ABNORMAL HIGH (ref 0.00–0.07)
Basophils Absolute: 0.1 10*3/uL (ref 0.0–0.1)
Basophils Relative: 0 %
Eosinophils Absolute: 0 10*3/uL (ref 0.0–0.5)
Eosinophils Relative: 0 %
HCT: 38.2 % — ABNORMAL LOW (ref 39.0–52.0)
Hemoglobin: 12.1 g/dL — ABNORMAL LOW (ref 13.0–17.0)
Immature Granulocytes: 1 %
Lymphocytes Relative: 1 %
Lymphs Abs: 0.3 10*3/uL — ABNORMAL LOW (ref 0.7–4.0)
MCH: 33.4 pg (ref 26.0–34.0)
MCHC: 31.7 g/dL (ref 30.0–36.0)
MCV: 105.5 fL — ABNORMAL HIGH (ref 80.0–100.0)
Monocytes Absolute: 0.3 10*3/uL (ref 0.1–1.0)
Monocytes Relative: 1 %
Neutro Abs: 28.5 10*3/uL — ABNORMAL HIGH (ref 1.7–7.7)
Neutrophils Relative %: 97 %
Platelets: 118 10*3/uL — ABNORMAL LOW (ref 150–400)
RBC: 3.62 MIL/uL — ABNORMAL LOW (ref 4.22–5.81)
RDW: 15.1 % (ref 11.5–15.5)
WBC: 29.4 10*3/uL — ABNORMAL HIGH (ref 4.0–10.5)
nRBC: 0 % (ref 0.0–0.2)

## 2023-05-28 LAB — URINALYSIS, W/ REFLEX TO CULTURE (INFECTION SUSPECTED)
Bilirubin Urine: NEGATIVE
Glucose, UA: NEGATIVE mg/dL
Ketones, ur: NEGATIVE mg/dL
Nitrite: NEGATIVE
Protein, ur: 100 mg/dL — AB
Specific Gravity, Urine: 1.011 (ref 1.005–1.030)
WBC, UA: >50 WBC/hpf (ref 0–5)
pH: 7 (ref 5.0–8.0)

## 2023-05-28 LAB — CBG MONITORING, ED
Glucose-Capillary: 53 mg/dL — ABNORMAL LOW (ref 70–99)
Glucose-Capillary: 164 mg/dL — ABNORMAL HIGH (ref 70–99)
Glucose-Capillary: 182 mg/dL — ABNORMAL HIGH (ref 70–99)
Glucose-Capillary: 176 mg/dL — ABNORMAL HIGH (ref 70–99)

## 2023-05-28 LAB — LACTIC ACID, PLASMA
Lactic Acid, Venous: 7.2 mmol/L (ref 0.5–1.9)
Lactic Acid, Venous: 5 mmol/L (ref 0.5–1.9)

## 2023-05-28 LAB — PROTIME-INR
INR: 1.3 — ABNORMAL HIGH (ref 0.8–1.2)
Prothrombin Time: 16.1 s — ABNORMAL HIGH (ref 11.4–15.2)

## 2023-05-28 MED ORDER — HEPARIN SODIUM (PORCINE) 5000 UNIT/ML IJ SOLN
5000.0000 [IU] | Freq: Three times a day (TID) | INTRAMUSCULAR | Status: DC
  Administered 2023-05-29 – 2023-06-03 (×15): 5000 [IU] via SUBCUTANEOUS
  Filled 2023-05-28 (×15): qty 1

## 2023-05-28 MED ORDER — ALBUTEROL SULFATE (2.5 MG/3ML) 0.083% IN NEBU: 2.5000 mg | INHALATION_SOLUTION | RESPIRATORY_TRACT | Status: DC | PRN

## 2023-05-28 MED ORDER — PRAVASTATIN SODIUM 10 MG PO TABS
10.0000 mg | ORAL_TABLET | Freq: Every day | ORAL | Status: DC
  Administered 2023-05-29 – 2023-06-02 (×5): 10 mg via ORAL
  Filled 2023-05-28 (×5): qty 1

## 2023-05-28 MED ORDER — DEXTROSE-SODIUM CHLORIDE 5-0.9 % IV SOLN: INTRAVENOUS | Status: AC

## 2023-05-28 MED ORDER — SODIUM CHLORIDE 0.9 % IV SOLN: 1.0000 g | INTRAVENOUS | Status: DC

## 2023-05-28 MED ORDER — INSULIN ASPART 100 UNIT/ML IJ SOLN: 0.0000 [IU] | Freq: Three times a day (TID) | INTRAMUSCULAR | Status: DC

## 2023-05-28 MED ORDER — SODIUM CHLORIDE 0.9 % IV SOLN
1.0000 g | INTRAVENOUS | Status: AC
  Administered 2023-05-28: 1 g via INTRAVENOUS
  Filled 2023-05-28: qty 10

## 2023-05-28 MED ORDER — DIAZEPAM 2 MG PO TABS
1.0000 mg | ORAL_TABLET | Freq: Every day | ORAL | Status: DC
  Administered 2023-05-28 – 2023-06-02 (×6): 1 mg via ORAL
  Filled 2023-05-28 (×6): qty 1

## 2023-05-28 MED ORDER — SODIUM CHLORIDE 0.9 % IV SOLN
1.0000 g | Freq: Once | INTRAVENOUS | Status: AC
  Administered 2023-05-28: 1 g via INTRAVENOUS
  Filled 2023-05-28: qty 10

## 2023-05-28 MED ORDER — BISACODYL 10 MG RE SUPP
10.0000 mg | Freq: Two times a day (BID) | RECTAL | Status: AC
  Administered 2023-05-28 – 2023-05-29 (×3): 10 mg via RECTAL
  Filled 2023-05-28 (×3): qty 1

## 2023-05-28 MED ORDER — ACETAMINOPHEN 325 MG PO TABS
650.0000 mg | ORAL_TABLET | Freq: Once | ORAL | Status: AC
  Administered 2023-05-28: 650 mg via ORAL
  Filled 2023-05-28: qty 2

## 2023-05-28 MED ORDER — ACETAMINOPHEN 325 MG PO TABS
650.0000 mg | ORAL_TABLET | Freq: Four times a day (QID) | ORAL | Status: DC | PRN
  Administered 2023-05-29 – 2023-06-02 (×5): 650 mg via ORAL
  Filled 2023-05-28 (×5): qty 2

## 2023-05-28 MED ORDER — SODIUM CHLORIDE 0.9 % IV SOLN
2.0000 g | INTRAVENOUS | Status: DC
  Administered 2023-05-29 – 2023-05-31 (×3): 2 g via INTRAVENOUS
  Filled 2023-05-28 (×3): qty 20

## 2023-05-28 MED ORDER — MIDODRINE HCL 5 MG PO TABS
10.0000 mg | ORAL_TABLET | Freq: Three times a day (TID) | ORAL | Status: DC
  Administered 2023-05-28 – 2023-05-31 (×9): 10 mg via ORAL
  Filled 2023-05-28 (×8): qty 2

## 2023-05-28 MED ORDER — ACETAMINOPHEN 650 MG RE SUPP: 650.0000 mg | Freq: Four times a day (QID) | RECTAL | Status: DC | PRN

## 2023-05-28 MED ORDER — DEXTROSE 50 % IV SOLN: 1.0000 | Freq: Once | INTRAVENOUS | Status: DC

## 2023-05-28 MED ORDER — SODIUM CHLORIDE 0.9 % IV BOLUS
1000.0000 mL | Freq: Once | INTRAVENOUS | Status: AC
  Administered 2023-05-28: 1000 mL via INTRAVENOUS

## 2023-05-28 MED ORDER — DEXTROSE 50 % IV SOLN
INTRAVENOUS | Status: AC
  Administered 2023-05-28: 50 mL
  Filled 2023-05-28: qty 50

## 2023-05-28 MED ORDER — BISACODYL 5 MG PO TBEC
10.0000 mg | DELAYED_RELEASE_TABLET | Freq: Every day | ORAL | Status: AC
  Administered 2023-05-28 – 2023-05-30 (×3): 10 mg via ORAL
  Filled 2023-05-28 (×3): qty 2

## 2023-05-28 NOTE — ED Triage Notes (Signed)
Pt BIB [facility] from dialysis for low blood sugar and AMS, c/o low back pain which reported is chronic.  Reported pt received full treatment. Pt with recent change to antibiotic for UTI -E. Coli.  Reported that pt took 2 units of his insulin with CBG of 64 at home.  Dialysis center got 58 CBG, oral glucose, CBG 61 and oral glucose again, last CBG 84 per EMS. Pt confused initially per EMS but that improved as pt finished oral glucose. Pt alert and oriented to place, month and year.

## 2023-05-28 NOTE — ED Notes (Signed)
Wife left to get pt's charger for his hearing aid to left ear.

## 2023-05-28 NOTE — ED Provider Notes (Signed)
[place] EMERGENCY DEPARTMENT AT [facility] Provider Note   CSN: [id number] Arrival date & time: 05/28/23  1555     History  Chief Complaint  Patient presents with   Hypoglycemia    Mark L Reczek Sr. is a 82 y.o. male.  Patient presenting from dialysis in which he received full treatment and started to have AMS and shaking. POC glucose was 58. Patient was given oral glucose. Recheck was 61. Patient received oral glucose again. Brought to ED. Currently being treated for UTI by Dr. Ronne Binning with Urology. Was on doxycycline. Family was called and told to switch to Nitrofuratonin. Patient received 2 doses at this time. Low grade temperature at home and chills. Symptoms of urinary urgency with hematuria and right sided flank pain. Some cough but mild and non-productive.   On arrival to ED patient's poc glucose was 84. D50 given.   The history is provided by the patient. No language interpreter was used.  Hypoglycemia Associated symptoms: no seizures, no shortness of breath and no vomiting        Home Medications Prior to Admission medications   Medication Sig Start Date End Date Taking? Authorizing Provider  diazepam (VALIUM) 2 MG tablet TAKE 1 TABLET BY MOUTH EVERY NIGHT AT BEDTIME AS NEEDED FOR INSOMNIA 04/11/23  Yes Donita Brooks, MD  diltiazem (CARDIZEM CD) 240 MG 24 hr capsule TAKE 1 CAPSULE(240 MG) BY MOUTH DAILY 09/30/22  Yes Donita Brooks, MD  HYDROcodone-acetaminophen (NORCO/VICODIN) 5-325 MG tablet Take 1 tablet by mouth every 12 (twelve) hours as needed for severe pain. Must last 30 days. 05/11/23 06/10/23 Yes Edward Jolly, MD  insulin aspart (NOVOLOG) 100 UNIT/ML injection Inject 8 Units into the skin 3 (three) times daily with meals. Patient taking differently: Inject 2 Units into the skin in the morning. Takes on dialysis days 02/06/21  Yes Sherryll Burger, Pratik D, DO  insulin glargine (LANTUS) 100 UNIT/ML injection Inject 0.12 mLs (12 Units total) into the skin  at bedtime. Patient taking differently: Inject 6 Units into the skin in the morning. Takes on days he doesn't have dialysis 03/20/21  Yes Pickard, Priscille Heidelberg, MD  lidocaine-prilocaine (EMLA) cream APPLY SMALL AMOUNT TO ACCESS SITE (AVF) 30 MINUTES BEFORE DIALYSIS. COVER WITH OCCLUSIVE DRESSING (SARAN WRAP) 04/21/23  Yes Donita Brooks, MD  nitrofurantoin, macrocrystal-monohydrate, (MACROBID) 100 MG capsule Take 1 capsule (100 mg total) by mouth 2 (two) times daily. 05/26/23  Yes McKenzie, Mardene Celeste, MD  pravastatin (PRAVACHOL) 10 MG tablet TAKE 1 TABLET(10 MG) BY MOUTH DAILY WITH BREAKFAST 03/14/23  Yes Donita Brooks, MD  cinacalcet (SENSIPAR) 30 MG tablet SMARTSIG:1 Tablet(s) By Mouth Every Evening Patient not taking: Reported on 05/28/2023 09/04/21   [provider]  doxycycline (VIBRAMYCIN) 100 MG capsule Take 1 capsule (100 mg total) by mouth every 12 (twelve) hours. 05/21/23   McKenzie, Mardene Celeste, MD  HYDROcodone-acetaminophen (NORCO/VICODIN) 5-325 MG tablet Take 1 tablet by mouth every 12 (twelve) hours as needed for severe pain. Must last 30 days. 06/10/23 07/10/23  Edward Jolly, MD  sevelamer carbonate (RENVELA) 800 MG tablet Take 800 mg by mouth 3 (three) times daily with meals. Patient not taking: Reported on 05/28/2023 05/09/23   [provider]  VELPHORO 500 MG chewable tablet Chew 500 mg by mouth 3 (three) times daily with meals. Patient not taking: Reported on 05/28/2023 05/23/23   [provider]      Allergies    Enalapril maleate    Review of Systems  Review of Systems  Constitutional:  Negative for chills and fever.  HENT:  Negative for ear pain and sore throat.   Eyes:  Negative for pain and visual disturbance.  Respiratory:  Negative for cough and shortness of breath.   Cardiovascular:  Negative for chest pain and palpitations.  Gastrointestinal:  Negative for abdominal pain and vomiting.  Genitourinary:  Positive for urgency. Negative for  dysuria and hematuria.  Musculoskeletal:  Negative for arthralgias and back pain.  Skin:  Negative for color change and rash.  Neurological:  Negative for seizures and syncope.  Psychiatric/Behavioral:  Positive for confusion.   All other systems reviewed and are negative.   Physical Exam Updated Vital Signs BP (!) 105/58   Pulse (!) 111   Temp 98.4 F (36.9 C) (Oral)   Resp (!) 21   Ht 5\' 8"  (1.727 m)   Wt 66.7 kg   SpO2 99%   BMI 22.35 kg/m  Physical Exam Vitals and nursing note reviewed.  Constitutional:      General: He is not in acute distress.    Appearance: He is well-developed.  HENT:     Head: Normocephalic and atraumatic.  Eyes:     Conjunctiva/sclera: Conjunctivae normal.  Cardiovascular:     Rate and Rhythm: Normal rate and regular rhythm.     Heart sounds: No murmur heard. Pulmonary:     Effort: Pulmonary effort is normal. No respiratory distress.     Breath sounds: Normal breath sounds.  Abdominal:     Palpations: Abdomen is soft.     Tenderness: There is no abdominal tenderness.  Musculoskeletal:        General: No swelling.     Cervical back: Neck supple.  Skin:    General: Skin is warm and dry.     Capillary Refill: Capillary refill takes less than 2 seconds.  Neurological:     Mental Status: He is alert.  Psychiatric:        Mood and Affect: Mood normal.     ED Results / Procedures / Treatments   Labs (all labs ordered are listed, but only abnormal results are displayed) Labs Reviewed  LACTIC ACID, PLASMA - Abnormal; Notable for the following components:      Result Value   Lactic Acid, Venous 7.2 (*)    All other components within normal limits  LACTIC ACID, PLASMA - Abnormal; Notable for the following components:   Lactic Acid, Venous 5.0 (*)    All other components within normal limits  PROTIME-INR - Abnormal; Notable for the following components:   Prothrombin Time 16.1 (*)    INR 1.3 (*)    All other components within normal  limits  URINALYSIS, W/ REFLEX TO CULTURE (INFECTION SUSPECTED) - Abnormal; Notable for the following components:   APPearance TURBID (*)    Hgb urine dipstick MODERATE (*)    Protein, ur 100 (*)    Leukocytes,Ua MODERATE (*)    Bacteria, UA MANY (*)    All other components within normal limits  CBC WITH DIFFERENTIAL/PLATELET - Abnormal; Notable for the following components:   WBC 29.4 (*)    RBC 3.62 (*)    Hemoglobin 12.1 (*)    HCT 38.2 (*)    MCV 105.5 (*)    Platelets 118 (*)    Neutro Abs 28.5 (*)    Lymphs Abs 0.3 (*)    Abs Immature Granulocytes 0.22 (*)    All other components within normal limits  COMPREHENSIVE METABOLIC PANEL -  Abnormal; Notable for the following components:   Sodium 134 (*)    Chloride 94 (*)    Glucose, Bld 161 (*)    Creatinine, Ser 2.19 (*)    Calcium 8.3 (*)    Total Protein 5.4 (*)    Albumin 2.1 (*)    Alkaline Phosphatase 356 (*)    GFR, Estimated 29 (*)    Anion gap 16 (*)    All other components within normal limits  CBG MONITORING, ED - Abnormal; Notable for the following components:   Glucose-Capillary 53 (*)    All other components within normal limits  CBG MONITORING, ED - Abnormal; Notable for the following components:   Glucose-Capillary 164 (*)    All other components within normal limits  CBG MONITORING, ED - Abnormal; Notable for the following components:   Glucose-Capillary 182 (*)    All other components within normal limits  CBG MONITORING, ED - Abnormal; Notable for the following components:   Glucose-Capillary 176 (*)    All other components within normal limits  CULTURE, BLOOD (ROUTINE X 2)  CULTURE, BLOOD (ROUTINE X 2)  URINE CULTURE  CBC WITH DIFFERENTIAL/PLATELET  HEMOGLOBIN A1C  CBC  LACTIC ACID, PLASMA  LACTIC ACID, PLASMA    EKG EKG Interpretation Date/Time:  Wednesday May 28 2023 16:28:33 EDT Ventricular Rate:  134 PR Interval:  111 QRS Duration:  124 QT Interval:  315 QTC Calculation: 471 R  Axis:   226  Text Interpretation: Sinus tachycardia with irregular rate Consider dextrocardia Confirmed by Edwin Dada ([id number]) on 05/28/2023 11:02:07 PM  Radiology CT Renal Stone Study  Result Date: 05/28/2023 CLINICAL DATA:  Flank pain. EXAM: CT ABDOMEN AND PELVIS WITHOUT CONTRAST TECHNIQUE: Multidetector CT imaging of the abdomen and pelvis was performed following the standard protocol without IV contrast. RADIATION DOSE REDUCTION: This exam was performed according to the departmental dose-optimization program which includes automated exposure control, adjustment of the mA and/or kV according to patient size and/or use of iterative reconstruction technique. COMPARISON:  March 20, 2013. FINDINGS: Lower chest: Minimal bibasilar subsegmental atelectasis is noted. Hepatobiliary: Status post cholecystectomy. Mild intrahepatic and extrahepatic biliary dilatation is noted most likely due to post cholecystectomy status. No definite acute hepatic abnormality seen on these unenhanced images. Pancreas: Diffuse pancreatic atrophy is noted. No acute abnormality seen. Spleen: Normal in size without focal abnormality. Adrenals/Urinary Tract: Adrenal glands are unremarkable. Left renal atrophy is noted. Urinary bladder is not well visualized due to scatter artifact arising from bilateral hip arthroplasties. Moderate right hydroureteronephrosis is noted which contains both gas and fluid. There appears to be multiple distal right ureteral calculi with the largest measuring 4 mm. Stomach/Bowel: Mild gastric distention is noted. Stool is noted throughout the colon. There is no definite evidence of bowel obstruction or inflammation. The appendix is not visualized. Vascular/Lymphatic: Aortic atherosclerosis. No enlarged abdominal or pelvic lymph nodes. Reproductive: Prostate is not well visualized due to scatter artifact arising from bilateral hip arthroplasties. Other: No ascites or hernia is noted. Musculoskeletal: No acute  or significant osseous findings. IMPRESSION: Moderate right hydroureteronephrosis is noted which contains both gas and fluid in the intrarenal collecting system and ureter. This may represent ascending urinary tract infection. Multiple distal right ureteral calculi are noted which may be obstructive, the largest measuring 4 mm. Mild gastric distention is noted. Stool is noted throughout the colon. Prostate gland and urinary bladder are not well visualized due to scatter artifact arising from bilateral hip arthroplasties. Aortic Atherosclerosis (ICD10-I70.0). Electronically Signed  By: Lupita Raider M.D.   On: 05/28/2023 20:28   DG Chest Port 1 View  Result Date: 05/28/2023 CLINICAL DATA:  [id number] Sepsis [facility]) [id number] EXAM: PORTABLE CHEST 1 VIEW COMPARISON:  Chest x-ray 04/16/2021, CT chest 12/02/2018 FINDINGS: The heart and mediastinal contours are unchanged. Aortic calcification. Low lung volumes. No focal consolidation. No pulmonary edema. No pleural effusion. No pneumothorax. No acute osseous abnormality. IMPRESSION: 1. Low lung volumes with no active disease. 2.  Aortic Atherosclerosis (ICD10-I70.0). Electronically Signed   By: Tish Frederickson M.D.   On: 05/28/2023 19:44    Procedures .Critical Care  Performed by: Franne Forts, DO Authorized by: Franne Forts, DO   Critical care provider statement:    Critical care time (minutes):  101   Critical care was necessary to treat or prevent imminent or life-threatening deterioration of the following conditions:  Sepsis   Critical care was time spent personally by me on the following activities:  Development of treatment plan with patient or surrogate, discussions with consultants, evaluation of patient's response to treatment, examination of patient, ordering and review of laboratory studies, ordering and review of radiographic studies, ordering and performing treatments and interventions, pulse oximetry, re-evaluation of patient's condition and  review of old charts   Care discussed with: admitting provider     Care discussed with comment:  Urology     Medications Ordered in ED Medications  dextrose 50 % solution 50 mL (0 mLs Intravenous Hold 05/28/23 1629)  midodrine (PROAMATINE) tablet 10 mg (10 mg Oral Given 05/28/23 2220)  pravastatin (PRAVACHOL) tablet 10 mg (has no administration in time range)  diazepam (VALIUM) tablet 1 mg (has no administration in time range)  heparin injection 5,000 Units (has no administration in time range)  acetaminophen (TYLENOL) tablet 650 mg (has no administration in time range)    Or  acetaminophen (TYLENOL) suppository 650 mg (has no administration in time range)  bisacodyl (DULCOLAX) suppository 10 mg (has no administration in time range)  albuterol (PROVENTIL) (2.5 MG/3ML) 0.083% nebulizer solution 2.5 mg (has no administration in time range)  bisacodyl (DULCOLAX) EC tablet 10 mg (has no administration in time range)  cefTRIAXone (ROCEPHIN) 2 g in sodium chloride 0.9 % 100 mL IVPB (has no administration in time range)  insulin aspart (novoLOG) injection 0-6 Units (has no administration in time range)  dextrose 5 %-0.9 % sodium chloride infusion (has no administration in time range)  cefTRIAXone (ROCEPHIN) 1 g in sodium chloride 0.9 % 100 mL IVPB (has no administration in time range)  dextrose 50 % solution (50 mLs  Given 05/28/23 1627)  sodium chloride 0.9 % bolus 1,000 mL (0 mLs Intravenous Stopped 05/28/23 1835)  cefTRIAXone (ROCEPHIN) 1 g in sodium chloride 0.9 % 100 mL IVPB (0 g Intravenous Stopped 05/28/23 2016)  acetaminophen (TYLENOL) tablet 650 mg (650 mg Oral Given 05/28/23 2202)    ED Course/ Medical Decision Making/ A&P                                 Medical Decision Making Amount and/or Complexity of Data Reviewed Labs: ordered. Radiology: ordered.  Risk OTC drugs. Prescription drug management. Decision regarding hospitalization.   82 year old male with past  medical history of end-stage renal disease on dialysis presenting from dialysis center after episode of altered mental status and shaking post hemodialysis.  On exam he is alert, oriented, with no focal neurological deficits.  His  POC glucose was found to be low at the dialysis center and had to receive 2 rounds of oral glucose and 1 round of D50 and route for repeat hypoglycemia.  He is currently being treated by Dr. Ronne Binning with urology for urinary tract infection and was switched from doxycycline to nitro serotonin.  On my exam he is showing signs and symptoms of sepsis including tachycardia, tachypnea, and mental status changes including confusion.  He has a leukocytosis of 29, urinary tract infection including white blood cells greater than 50 on UA and leukocyte esterase presents.  Urine culture sent.  Rocephin given.  CT renal stone concerning for possibly obstructing stone.  Urology consulted.  Recommend admission for sepsis from UT from septic stone and obstruction.  Admitting team spoke with urology who accepts patient to [facility] for urological interventions as well as dialysis during stay.  Patient otherwise stable at this time.  I do not recommend more fluids due to patient's end-stage renal disease.  If hypotension occurs pressors are recommended with ED to ED transfer by both I and the admitting team.        Final Clinical Impression(s) / ED Diagnoses Final diagnoses:  Sepsis, due to unspecified organism, unspecified whether acute organ dysfunction present [facility])  Acute cystitis without hematuria  Hypoglycemia  Ureterolithiasis    Rx / DC Orders ED Discharge Orders     None         Franne Forts, DO 05/28/23 2303

## 2023-05-28 NOTE — H&P (Addendum)
TRH H&P   Patient Demographics:    Khalen Betton, is a 82 y.o. male  MRN: [id number]   DOB - May 18, 1941  Admit Date - 05/28/2023  Outpatient Primary MD for the patient is Donita Brooks, MD  Referring MD/NP/PA: Dr Wallace Cullens  Outpatient Specialists: Urology Dr Ronne Binning    Patient coming from: HD center  Chief Complaint  Patient presents with   Hypoglycemia      HPI:    Inoke Vangorden  is a 82 y.o. male, ith a history of type 2 diabetes on insulin, hypertension, ESRD requiring hemodialysis  -Patient was sent by his HD center for hypoglycemia, patient did receive a full treatment yesterday, he had altered mental status, shaking, CBG at HD center where 58, he was given oral glucose, recheck was at 61, received another dose of oral glucose and brought to ED for further evaluation, CBG in ED was at 84, he received D50, and it remained persistently> 100 after that, patient reports generalized weakness, fatigue, low-grade temperature at home with chills, he has been followed by Dr. Ronne Binning for UTI, he has been on doxycycline, and switched to nitrofurantoin recently. -In ED his workup significant for sepsis pressure initially soft, but this has responded to 1 L of fluid bolus, tachycardic, difficult leukocytosis, elevated lactic acid, positive UA, CT kidney stone significant for right kidney nephrosis, with gas, and distal stone obstruction largest at 4 mm, [facility] hospitalist consulted to admit    Review of systems:      A full 10 point Review of Systems was done, except as stated above, all other Review of Systems were negative.   With Past History of the following :    Past Medical History:  Diagnosis Date   Arthritis    lower back   Chronic back pain    Disc disease   Chronic kidney disease    Coronary atherosclerosis of native coronary artery    Coronary calcifications by  chest CT, Myoview demonstrating inferolateral scar   Deafness in right ear    Diabetes mellitus    Diabetic retinopathy (HCC)    moderate nonproliferative with macular edema in right eye   Diastolic dysfunction 12/2011   Grade 1.   Essential hypertension, benign    Heart murmur    in past   HOH (hard of hearing)    Hyperkalemia    Kidney stones    Mixed hyperlipidemia    NAFLD (nonalcoholic fatty liver disease)    Pancreatic insufficiency    Diagnosed at [facility]   Prostate cancer [facility]) 2011   Shortness of breath dyspnea    occasional - liver pressing on right lung, decreased capacity   Subdural hematoma (HCC)    Type 2 diabetes mellitus (HCC)    Wears hearing aid    left      Past Surgical History:  Procedure Laterality Date   APPENDECTOMY  AV FISTULA PLACEMENT Left 05/15/2021   Procedure: INSERTION OF LEFT ARM ARTERIOVENOUS (AV) GORE-TEX GRAFT;  Surgeon: Larina Earthly, MD;  Location: [facility];  Service: Vascular;  Laterality: Left;   Bilateral hip replacement     CATARACT EXTRACTION W/PHACO Left 05/15/2015   Procedure: CATARACT EXTRACTION PHACO AND INTRAOCULAR LENS PLACEMENT (IOC);  Surgeon: Sherald Hess, MD;  Location: [facility];  Service: Ophthalmology;  Laterality: Left;  DIABETIC - insulin pump and oral meds   CATARACT EXTRACTION W/PHACO Right 08/28/2015   Procedure: CATARACT EXTRACTION PHACO AND INTRAOCULAR LENS PLACEMENT (IOC);  Surgeon: Sherald Hess, MD;  Location: [facility];  Service: Ophthalmology;  Laterality: Right;  DIABETIC PER PT AND CINDY PLEASE KEEP ARRIVAL TIME AFTER 8AM    CHOLECYSTECTOMY     HERNIA REPAIR     INSERTION OF DIALYSIS CATHETER Right 01/22/2021   Procedure: INSERTION OF DIALYSIS CATHETER;  Surgeon: Lucretia Roers, MD;  Location: [facility];  Service: General;  Laterality: Right;   INSERTION OF DIALYSIS CATHETER Right 04/16/2021   Procedure: INSERTION OF DIALYSIS CATHETER;  Surgeon: Lucretia Roers,  MD;  Location: [facility];  Service: General;  Laterality: Right;   PROSTATECTOMY  2011      Social History:     Social History   Tobacco Use   Smoking status: Never   Smokeless tobacco: Never  Substance Use Topics   Alcohol use: No      Family History :     Family History  Problem Relation Age of Onset   Diabetes type II Mother    Heart attack Father      Home Medications:   Prior to Admission medications   Medication Sig Start Date End Date Taking? Authorizing Provider  diazepam (VALIUM) 2 MG tablet TAKE 1 TABLET BY MOUTH EVERY NIGHT AT BEDTIME AS NEEDED FOR INSOMNIA 04/11/23  Yes Donita Brooks, MD  diltiazem (CARDIZEM CD) 240 MG 24 hr capsule TAKE 1 CAPSULE(240 MG) BY MOUTH DAILY 09/30/22  Yes Donita Brooks, MD  HYDROcodone-acetaminophen (NORCO/VICODIN) 5-325 MG tablet Take 1 tablet by mouth every 12 (twelve) hours as needed for severe pain. Must last 30 days. 05/11/23 06/10/23 Yes Edward Jolly, MD  insulin aspart (NOVOLOG) 100 UNIT/ML injection Inject 8 Units into the skin 3 (three) times daily with meals. Patient taking differently: Inject 2 Units into the skin in the morning. Takes on dialysis days 02/06/21  Yes Sherryll Burger, Pratik D, DO  insulin glargine (LANTUS) 100 UNIT/ML injection Inject 0.12 mLs (12 Units total) into the skin at bedtime. Patient taking differently: Inject 6 Units into the skin in the morning. Takes on days he doesn't have dialysis 03/20/21  Yes Pickard, Priscille Heidelberg, MD  lidocaine-prilocaine (EMLA) cream APPLY SMALL AMOUNT TO ACCESS SITE (AVF) 30 MINUTES BEFORE DIALYSIS. COVER WITH OCCLUSIVE DRESSING (SARAN WRAP) 04/21/23  Yes Donita Brooks, MD  nitrofurantoin, macrocrystal-monohydrate, (MACROBID) 100 MG capsule Take 1 capsule (100 mg total) by mouth 2 (two) times daily. 05/26/23  Yes McKenzie, Mardene Celeste, MD  pravastatin (PRAVACHOL) 10 MG tablet TAKE 1 TABLET(10 MG) BY MOUTH DAILY WITH BREAKFAST 03/14/23  Yes Donita Brooks, MD  cinacalcet (SENSIPAR) 30 MG  tablet SMARTSIG:1 Tablet(s) By Mouth Every Evening Patient not taking: Reported on 05/28/2023 09/04/21   [provider]  doxycycline (VIBRAMYCIN) 100 MG capsule Take 1 capsule (100 mg total) by mouth every 12 (twelve) hours. 05/21/23   McKenzie, Mardene Celeste, MD  HYDROcodone-acetaminophen (NORCO/VICODIN) 5-325 MG tablet Take 1  tablet by mouth every 12 (twelve) hours as needed for severe pain. Must last 30 days. 06/10/23 07/10/23  Edward Jolly, MD  sevelamer carbonate (RENVELA) 800 MG tablet Take 800 mg by mouth 3 (three) times daily with meals. Patient not taking: Reported on 05/28/2023 05/09/23   [provider]  VELPHORO 500 MG chewable tablet Chew 500 mg by mouth 3 (three) times daily with meals. Patient not taking: Reported on 05/28/2023 05/23/23   [provider]     Allergies:     Allergies  Allergen Reactions   Enalapril Maleate Cough     Physical Exam:   Vitals  Blood pressure (!) 105/58, pulse (!) 111, temperature 98.4 F (36.9 C), temperature source Oral, resp. rate (!) 21, height 5\' 8"  (1.727 m), weight 66.7 kg, SpO2 99%.   1. General Pale, elderly appearing male, laying in bed, no apparent distress  2. Normal affect and insight, Not Suicidal or Homicidal, Awake Alert, Oriented X 3.  3. No F.N deficits, ALL C.Nerves Intact, Strength 5/5 all 4 extremities, Sensation intact all 4 extremities, Plantars down going.  4. Ears and Eyes appear Normal, Conjunctivae clear, PERRLA. Moist Oral Mucosa.  5. Supple Neck, No JVD, No cervical lymphadenopathy appriciated, No Carotid Bruits.  6. Symmetrical Chest wall movement, Good air movement bilaterally, CTAB.  7. RRR, No Gallops, Rubs or Murmurs, No Parasternal Heave.  8. Positive Bowel Sounds, Abdomen Soft, No tenderness, No organomegaly appriciated,No rebound -guarding or rigidity.  9.  No Cyanosis, Normal Skin Turgor, No Skin Rash or Bruise.  10.  Difficult muscle wasting,  joints appear normal , no  effusions, Normal ROM.  Frail,    Data Review:    CBC Recent Labs  Lab 05/28/23 1626  WBC 29.4*  HGB 12.1*  HCT 38.2*  PLT 118*  MCV 105.5*  MCH 33.4  MCHC 31.7  RDW 15.1  LYMPHSABS 0.3*  MONOABS 0.3  EOSABS 0.0  BASOSABS 0.1   ------------------------------------------------------------------------------------------------------------------  Chemistries  Recent Labs  Lab 05/28/23 1738  NA 134*  K 3.9  CL 94*  CO2 24  GLUCOSE 161*  BUN 14  CREATININE 2.19*  CALCIUM 8.3*  AST 35  ALT 39  ALKPHOS 356*  BILITOT 1.2   ------------------------------------------------------------------------------------------------------------------ estimated creatinine clearance is 24.5 mL/min (A) (by C-G formula based on SCr of 2.19 mg/dL (H)). ------------------------------------------------------------------------------------------------------------------ No results for input(s): "TSH", "T4TOTAL", "T3FREE", "THYROIDAB" in the last 72 hours.  Invalid input(s): "FREET3"  Coagulation profile Recent Labs  Lab 05/28/23 1654  INR 1.3*   ------------------------------------------------------------------------------------------------------------------- No results for input(s): "DDIMER" in the last 72 hours. -------------------------------------------------------------------------------------------------------------------  Cardiac Enzymes No results for input(s): "CKMB", "TROPONINI", "MYOGLOBIN" in the last 168 hours.  Invalid input(s): "CK" ------------------------------------------------------------------------------------------------------------------    Component Value Date/Time   BNP 700.0 (H) 04/13/2021 1230   BNP 202 (H) 04/01/2019 1223     ---------------------------------------------------------------------------------------------------------------  Urinalysis    Component Value Date/Time   COLORURINE YELLOW 05/28/2023 1945   APPEARANCEUR TURBID (A) 05/28/2023  1945   LABSPEC 1.011 05/28/2023 1945   PHURINE 7.0 05/28/2023 1945   GLUCOSEU NEGATIVE 05/28/2023 1945   HGBUR MODERATE (A) 05/28/2023 1945   BILIRUBINUR NEGATIVE 05/28/2023 1945   KETONESUR NEGATIVE 05/28/2023 1945   PROTEINUR 100 (A) 05/28/2023 1945   UROBILINOGEN 0.2 03/20/2013 2016   NITRITE NEGATIVE 05/28/2023 1945   LEUKOCYTESUR MODERATE (A) 05/28/2023 1945    ----------------------------------------------------------------------------------------------------------------   Imaging Results:    CT Renal Stone Study  Result Date: 05/28/2023 CLINICAL DATA:  Flank pain.  EXAM: CT ABDOMEN AND PELVIS WITHOUT CONTRAST TECHNIQUE: Multidetector CT imaging of the abdomen and pelvis was performed following the standard protocol without IV contrast. RADIATION DOSE REDUCTION: This exam was performed according to the departmental dose-optimization program which includes automated exposure control, adjustment of the mA and/or kV according to patient size and/or use of iterative reconstruction technique. COMPARISON:  March 20, 2013. FINDINGS: Lower chest: Minimal bibasilar subsegmental atelectasis is noted. Hepatobiliary: Status post cholecystectomy. Mild intrahepatic and extrahepatic biliary dilatation is noted most likely due to post cholecystectomy status. No definite acute hepatic abnormality seen on these unenhanced images. Pancreas: Diffuse pancreatic atrophy is noted. No acute abnormality seen. Spleen: Normal in size without focal abnormality. Adrenals/Urinary Tract: Adrenal glands are unremarkable. Left renal atrophy is noted. Urinary bladder is not well visualized due to scatter artifact arising from bilateral hip arthroplasties. Moderate right hydroureteronephrosis is noted which contains both gas and fluid. There appears to be multiple distal right ureteral calculi with the largest measuring 4 mm. Stomach/Bowel: Mild gastric distention is noted. Stool is noted throughout the colon. There is no  definite evidence of bowel obstruction or inflammation. The appendix is not visualized. Vascular/Lymphatic: Aortic atherosclerosis. No enlarged abdominal or pelvic lymph nodes. Reproductive: Prostate is not well visualized due to scatter artifact arising from bilateral hip arthroplasties. Other: No ascites or hernia is noted. Musculoskeletal: No acute or significant osseous findings. IMPRESSION: Moderate right hydroureteronephrosis is noted which contains both gas and fluid in the intrarenal collecting system and ureter. This may represent ascending urinary tract infection. Multiple distal right ureteral calculi are noted which may be obstructive, the largest measuring 4 mm. Mild gastric distention is noted. Stool is noted throughout the colon. Prostate gland and urinary bladder are not well visualized due to scatter artifact arising from bilateral hip arthroplasties. Aortic Atherosclerosis (ICD10-I70.0). Electronically Signed   By: Lupita Raider M.D.   On: 05/28/2023 20:28   DG Chest Port 1 View  Result Date: 05/28/2023 CLINICAL DATA:  [id number] Sepsis [facility]) [id number] EXAM: PORTABLE CHEST 1 VIEW COMPARISON:  Chest x-ray 04/16/2021, CT chest 12/02/2018 FINDINGS: The heart and mediastinal contours are unchanged. Aortic calcification. Low lung volumes. No focal consolidation. No pulmonary edema. No pleural effusion. No pneumothorax. No acute osseous abnormality. IMPRESSION: 1. Low lung volumes with no active disease. 2.  Aortic Atherosclerosis (ICD10-I70.0). Electronically Signed   By: Tish Frederickson M.D.   On: 05/28/2023 19:44     EKG Vent. rate 134 BPM PR interval 111 ms QRS duration 124 ms QT/QTcB 315/471 ms P-R-T axes 202 226 4 Sinus tachycardia with irregular rate Consider dextrocardia   Heart rate currently regular, in the 90s on telemetry.   Assessment & Plan:    Principal Problem:   Severe sepsis (HCC) Active Problems:   DM type 2 with diabetic peripheral neuropathy (HCC)   Sepsis  (HCC)   Severe sepsis present on admission Complicated UTI-infected kidney stone-right-sided hydronephrosis -Severe sepsis present on admission, hypotension, leukocytosis -Source of sepsis related to complicated UTI with infected kidney stone-right hydronephrosis -Discussed with Dr. Pete Glatter from urology, this is likely will need intervention, he request admission to [facility] in case he will need urgent intervention-keep n.p.o. after midnight -Continue with gentle IV fluids (keeping in mind he is ESRD, but appears to be dry and he received full dialysis today as well he presents with hypoglycemia, so he will be on D5 half NS at 40 cc/h -Follow blood cultures, follow urine cultures -Patient urine culture 05/20/2021 growing pansensitive  E. coli, continue with IV Rocephin 2 g daily (treated with 7 days of doxycycline and 1 day of nitrofurantoin as an outpatient) -Continue with midodrine for soft blood pressure  Diabetes mellitus, type II, insulin-dependent, uncontrolled with hypoglycemia -BG at HD center, did not improve with p.o. glucose, has improved with D50 push while in ED -Hold long-acting insulin, will keep on insulin sliding scale -continue with D5 half NS at 40 cc/h, this can be discontinued once CBG are stable-elevated  ESRD -HD Monday Wednesday Friday, he received full session today, with [place] of volume overload, no urgent need for dialysis, will have morning team consult nephrology as he will need dialysis on Friday  Hyperlipidemia -Continue with statin  Large stool burden on imaging -Start on laxatives  Hypertension>> hypotension -Blood pressure is soft due to sepsis, hold Cardizem and keep on midodrine for now   hisotory of palpitation/SVT -he has been on Cardizem for that, which she is to remain on hold   DVT Prophylaxis Heparin   AM Labs Ordered, also please review Full Orders  Family Communication: Admission, patients condition and plan of care including  tests being ordered have been discussed with the patient and wife by phone who indicate understanding and agree with the plan and Code Status.  Code Status Full   Likely DC to  home  Consults called: D/W urology Dr Pete Glatter    Admission status: inpatient    Time spent in minutes : 75 minutes   Huey Bienenstock M.D on 05/28/2023 at 10:45 PM   [facility] - Office  [phone]

## 2023-05-28 NOTE — ED Notes (Signed)
Admitting provider at bedside.

## 2023-05-28 NOTE — ED Notes (Signed)
Pt returned from CT °

## 2023-05-29 ENCOUNTER — Encounter: Payer: Self-pay | Admitting: Urology

## 2023-05-29 ENCOUNTER — Encounter (HOSPITAL_COMMUNITY): Payer: Self-pay | Admitting: Internal Medicine

## 2023-05-29 ENCOUNTER — Inpatient Hospital Stay (HOSPITAL_COMMUNITY): Payer: PPO

## 2023-05-29 ENCOUNTER — Inpatient Hospital Stay (HOSPITAL_COMMUNITY): Payer: PPO | Admitting: Anesthesiology

## 2023-05-29 ENCOUNTER — Encounter (HOSPITAL_COMMUNITY)
Admission: EM | Disposition: A | Discharge status: Home Health | Payer: Self-pay | Source: Home / Self Care | Attending: Internal Medicine

## 2023-05-29 DIAGNOSIS — N201 Calculus of ureter: Secondary | ICD-10-CM | POA: Insufficient documentation

## 2023-05-29 DIAGNOSIS — N186 End stage renal disease: Secondary | ICD-10-CM | POA: Diagnosis not present

## 2023-05-29 DIAGNOSIS — A419 Sepsis, unspecified organism: Secondary | ICD-10-CM | POA: Diagnosis present

## 2023-05-29 DIAGNOSIS — N1330 Unspecified hydronephrosis: Secondary | ICD-10-CM | POA: Diagnosis not present

## 2023-05-29 DIAGNOSIS — E1142 Type 2 diabetes mellitus with diabetic polyneuropathy: Secondary | ICD-10-CM | POA: Diagnosis not present

## 2023-05-29 DIAGNOSIS — N390 Urinary tract infection, site not specified: Secondary | ICD-10-CM

## 2023-05-29 DIAGNOSIS — I2510 Atherosclerotic heart disease of native coronary artery without angina pectoris: Secondary | ICD-10-CM

## 2023-05-29 HISTORY — PX: CYSTOSCOPY W/ URETERAL STENT PLACEMENT: SHX1429

## 2023-05-29 LAB — RENAL FUNCTION PANEL
Albumin: 1.8 g/dL — ABNORMAL LOW (ref 3.5–5.0)
Anion gap: 10 (ref 5–15)
BUN: 23 mg/dL (ref 8–23)
CO2: 25 mmol/L (ref 22–32)
Calcium: 7.7 mg/dL — ABNORMAL LOW (ref 8.9–10.3)
Chloride: 97 mmol/L — ABNORMAL LOW (ref 98–111)
Creatinine, Ser: 2.76 mg/dL — ABNORMAL HIGH (ref 0.61–1.24)
GFR, Estimated: 22 mL/min — ABNORMAL LOW (ref >60)
Glucose, Bld: 207 mg/dL — ABNORMAL HIGH (ref 70–99)
Phosphorus: 4.5 mg/dL (ref 2.5–4.6)
Potassium: 3.6 mmol/L (ref 3.5–5.1)
Sodium: 132 mmol/L — ABNORMAL LOW (ref 135–145)

## 2023-05-29 LAB — BLOOD CULTURE ID PANEL (REFLEXED) - BCID2

## 2023-05-29 LAB — HEMOGLOBIN A1C
Hgb A1c MFr Bld: 6.5 % — ABNORMAL HIGH (ref 4.8–5.6)
Mean Plasma Glucose: 139.85 mg/dL

## 2023-05-29 LAB — GLUCOSE, CAPILLARY
Glucose-Capillary: 144 mg/dL — ABNORMAL HIGH (ref 70–99)
Glucose-Capillary: 158 mg/dL — ABNORMAL HIGH (ref 70–99)
Glucose-Capillary: 138 mg/dL — ABNORMAL HIGH (ref 70–99)
Glucose-Capillary: 103 mg/dL — ABNORMAL HIGH (ref 70–99)
Glucose-Capillary: 107 mg/dL — ABNORMAL HIGH (ref 70–99)

## 2023-05-29 LAB — LACTIC ACID, PLASMA
Lactic Acid, Venous: 2.3 mmol/L (ref 0.5–1.9)
Lactic Acid, Venous: 1.7 mmol/L (ref 0.5–1.9)

## 2023-05-29 LAB — MRSA NEXT GEN BY PCR, NASAL: MRSA by PCR Next Gen: NOT DETECTED

## 2023-05-29 SURGERY — CYSTOSCOPY, WITH RETROGRADE PYELOGRAM AND URETERAL STENT INSERTION
Anesthesia: General | Site: Ureter | Laterality: Right

## 2023-05-29 MED ORDER — LIDOCAINE HCL (CARDIAC) PF 100 MG/5ML IV SOSY
PREFILLED_SYRINGE | INTRAVENOUS | Status: DC | PRN
  Administered 2023-05-29: 60 mg via INTRAVENOUS

## 2023-05-29 MED ORDER — FENTANYL CITRATE (PF) 100 MCG/2ML IJ SOLN
INTRAMUSCULAR | Status: DC | PRN
  Administered 2023-05-29 (×2): 25 ug via INTRAVENOUS

## 2023-05-29 MED ORDER — PROPOFOL 10 MG/ML IV BOLUS
INTRAVENOUS | Status: DC | PRN
  Administered 2023-05-29: 50 mg via INTRAVENOUS

## 2023-05-29 MED ORDER — STERILE WATER FOR IRRIGATION IR SOLN
Status: DC | PRN
  Administered 2023-05-29: 500 mL

## 2023-05-29 MED ORDER — CHLORHEXIDINE GLUCONATE CLOTH 2 % EX PADS
6.0000 | MEDICATED_PAD | Freq: Every day | CUTANEOUS | Status: DC
  Administered 2023-05-30 – 2023-06-02 (×4): 6 via TOPICAL

## 2023-05-29 MED ORDER — ONDANSETRON HCL 4 MG/2ML IJ SOLN
INTRAMUSCULAR | Status: DC | PRN
  Administered 2023-05-29: 4 mg via INTRAVENOUS

## 2023-05-29 MED ORDER — SODIUM CHLORIDE 0.9 % IV SOLN
INTRAVENOUS | Status: AC
  Administered 2023-05-29: 2 g via INTRAVENOUS
  Filled 2023-05-29: qty 20

## 2023-05-29 MED ORDER — OXYCODONE HCL 5 MG/5ML PO SOLN: 5.0000 mg | Freq: Once | ORAL | Status: DC | PRN

## 2023-05-29 MED ORDER — OXYCODONE HCL 5 MG PO TABS: 5.0000 mg | ORAL_TABLET | Freq: Once | ORAL | Status: DC | PRN

## 2023-05-29 MED ORDER — INSULIN ASPART 100 UNIT/ML IJ SOLN
0.0000 [IU] | Freq: Every day | INTRAMUSCULAR | Status: DC
  Administered 2023-05-30: 4 [IU] via SUBCUTANEOUS
  Administered 2023-06-01: 3 [IU] via SUBCUTANEOUS
  Administered 2023-06-02: 2 [IU] via SUBCUTANEOUS

## 2023-05-29 MED ORDER — ONDANSETRON HCL 4 MG/2ML IJ SOLN: 4.0000 mg | Freq: Once | INTRAMUSCULAR | Status: DC | PRN

## 2023-05-29 MED ORDER — CHLORHEXIDINE GLUCONATE CLOTH 2 % EX PADS
6.0000 | MEDICATED_PAD | Freq: Every day | CUTANEOUS | Status: DC
Start: 2023-05-29 — End: 2023-06-03
  Administered 2023-05-29 – 2023-06-03 (×6): 6 via TOPICAL

## 2023-05-29 MED ORDER — EPHEDRINE SULFATE (PRESSORS) 50 MG/ML IJ SOLN
INTRAMUSCULAR | Status: DC | PRN
  Administered 2023-05-29: 10 mg via INTRAVENOUS
  Administered 2023-05-29: 15 mg via INTRAVENOUS

## 2023-05-29 MED ORDER — PHENYLEPHRINE HCL (PRESSORS) 10 MG/ML IV SOLN
INTRAVENOUS | Status: DC | PRN
  Administered 2023-05-29 (×7): 160 ug via INTRAVENOUS

## 2023-05-29 MED ORDER — ORAL CARE MOUTH RINSE: 15.0000 mL | Freq: Once | OROMUCOSAL | Status: DC

## 2023-05-29 MED ORDER — EPHEDRINE 5 MG/ML INJ
INTRAVENOUS | Status: AC
Start: 2023-05-29 — End: ?
  Filled 2023-05-29: qty 5

## 2023-05-29 MED ORDER — DIATRIZOATE MEGLUMINE 30 % UR SOLN
URETHRAL | Status: DC | PRN
  Administered 2023-05-29: 7 mL

## 2023-05-29 MED ORDER — FENTANYL CITRATE PF 50 MCG/ML IJ SOSY: 25.0000 ug | PREFILLED_SYRINGE | INTRAMUSCULAR | Status: DC | PRN

## 2023-05-29 MED ORDER — PROPOFOL 10 MG/ML IV BOLUS
INTRAVENOUS | Status: AC
  Filled 2023-05-29: qty 20

## 2023-05-29 MED ORDER — CHLORHEXIDINE GLUCONATE 0.12 % MT SOLN: 15.0000 mL | Freq: Once | OROMUCOSAL | Status: DC

## 2023-05-29 MED ORDER — DILTIAZEM HCL ER COATED BEADS 240 MG PO CP24
240.0000 mg | ORAL_CAPSULE | Freq: Every day | ORAL | Status: DC
  Administered 2023-05-29 – 2023-06-03 (×5): 240 mg via ORAL
  Filled 2023-05-29 (×5): qty 1

## 2023-05-29 MED ORDER — LIDOCAINE HCL (PF) 2 % IJ SOLN
INTRAMUSCULAR | Status: AC
Start: 2023-05-29 — End: ?
  Filled 2023-05-29: qty 5

## 2023-05-29 MED ORDER — FENTANYL CITRATE (PF) 100 MCG/2ML IJ SOLN
INTRAMUSCULAR | Status: AC
  Filled 2023-05-29: qty 2

## 2023-05-29 MED ORDER — SODIUM CHLORIDE 0.9 % IR SOLN
Status: DC | PRN
  Administered 2023-05-29: 3000 mL

## 2023-05-29 MED ORDER — INSULIN ASPART 100 UNIT/ML IJ SOLN
0.0000 [IU] | Freq: Three times a day (TID) | INTRAMUSCULAR | Status: DC
  Administered 2023-05-30 – 2023-05-31 (×2): 1 [IU] via SUBCUTANEOUS
  Administered 2023-06-01 – 2023-06-03 (×4): 2 [IU] via SUBCUTANEOUS

## 2023-05-29 MED ORDER — SODIUM CHLORIDE 0.9 % IV SOLN: INTRAVENOUS | Status: DC | PRN

## 2023-05-29 MED ORDER — PHENYLEPHRINE 80 MCG/ML (10ML) SYRINGE FOR IV PUSH (FOR BLOOD PRESSURE SUPPORT)
PREFILLED_SYRINGE | INTRAVENOUS | Status: AC
  Filled 2023-05-29: qty 40

## 2023-05-29 MED ORDER — DIATRIZOATE MEGLUMINE 30 % UR SOLN
URETHRAL | Status: AC
  Filled 2023-05-29: qty 100

## 2023-05-29 SURGICAL SUPPLY — 22 items
BAG DRAIN URO TABLE W/ADPT NS (BAG) ×1 IMPLANT
BAG DRN 8 ADPR NS SKTRN CSTL (BAG) ×1
BAG HAMPER (MISCELLANEOUS) ×1 IMPLANT
CATH INTERMIT  6FR 70CM (CATHETERS) ×1 IMPLANT
GLOVE BIO SURGEON STRL SZ8 (GLOVE) ×1 IMPLANT
GLOVE BIOGEL PI IND STRL 7.0 (GLOVE) ×2 IMPLANT
GLOVE BIOGEL PI IND STRL 8 (GLOVE) IMPLANT
GLOVE SURG SS PI 8.0 STRL IVOR (GLOVE) IMPLANT
GOWN STRL REUS W/TWL XL LVL3 (GOWN DISPOSABLE) ×1 IMPLANT
GUIDEWIRE STR ZIPWIRE 035X150 (MISCELLANEOUS) ×1 IMPLANT
IV NS IRRIG 3000ML ARTHROMATIC (IV SOLUTION) ×1 IMPLANT
KIT TURNOVER CYSTO (KITS) ×1 IMPLANT
MANIFOLD NEPTUNE II (INSTRUMENTS) ×1 IMPLANT
PACK CYSTO (CUSTOM PROCEDURE TRAY) ×1 IMPLANT
PAD ARMBOARD 7.5X6 YLW CONV (MISCELLANEOUS) ×1 IMPLANT
POSITIONER HEAD 8X9X4 ADT (SOFTGOODS) ×1 IMPLANT
STENT URET 6FRX26 CONTOUR (STENTS) IMPLANT
SYR 10ML LL (SYRINGE) ×1 IMPLANT
TOWEL OR 17X26 4PK STRL BLUE (TOWEL DISPOSABLE) ×1 IMPLANT
TRAY FOLEY W/BAG SLVR 16FR (SET/KITS/TRAYS/PACK) ×1
TRAY FOLEY W/BAG SLVR 16FR ST (SET/KITS/TRAYS/PACK) IMPLANT
WATER STERILE IRR 500ML POUR (IV SOLUTION) ×1 IMPLANT

## 2023-05-29 NOTE — TOC Initial Note (Signed)
Transition of Care (TOC) - Initial/Assessment Note    Patient Details  Name: Mark PONZIO Sr. MRN: [id number] Date of Birth: 05-Dec-1940  Transition of Care [facility]) CM/SW Contact:    Karn Cassis, LCSW Phone Number: 05/29/2023, 2:06 PM  Clinical Narrative: Pt admitted for severe sepsis. Assessment completed due to high risk readmission score. Pt lives with his wife and is fairly independent with ADLs at baseline. He ambulates with a rollator. Pt's dialysis is MWF at [facility]. His wife provides transport to appointments. PT evaluated pt and recommend HHPT. Discussed with wife who reports pt has not wanted home health in the past. She asked if TOC would follow up closer to d/c to see if pt would agree.                     Barriers to Discharge: Continued Medical Work up   Patient Goals and CMS Choice Patient states their goals for this hospitalization and ongoing recovery are:: return home   Choice offered to / list presented to : Spouse Elias-Fela Solis ownership interest in [facility].provided to::  (n/a)    Expected Discharge Plan and Services In-house Referral: Clinical Social Work     Living arrangements for the past 2 months: Single Family Home                                      Prior Living Arrangements/Services Living arrangements for the past 2 months: Single Family Home Lives with:: Spouse Patient language and need for interpreter reviewed:: Yes Do you feel safe going back to the place where you live?: Yes          Current home services: DME (rollator) Criminal Activity/Legal Involvement Pertinent to Current Situation/Hospitalization: No - Comment as needed  Activities of Daily Living   ADL Screening (condition at time of admission) Independently performs ADLs?: Yes (appropriate for developmental age) Does the patient have a NEW difficulty with bathing/dressing/toileting/self-feeding that is expected to last >3 days?: No Does the  patient have a NEW difficulty with getting in/out of bed, walking, or climbing stairs that is expected to last >3 days?: No Does the patient have a NEW difficulty with communication that is expected to last >3 days?: No Is the patient deaf or have difficulty hearing?: Yes Does the patient have difficulty seeing, even when wearing glasses/contacts?: No Does the patient have difficulty concentrating, remembering, or making decisions?: No  Permission Sought/Granted                  Emotional Assessment         Alcohol / Substance Use: Not Applicable Psych Involvement: No (comment)  Admission diagnosis:  Ureterolithiasis [N20.1] Hypoglycemia [E16.2] Acute cystitis without hematuria [N30.00] Sepsis secondary to UTI (HCC) [A41.9, N39.0] Severe sepsis (HCC) [A41.9, R65.20] Sepsis, due to unspecified organism, unspecified whether acute organ dysfunction present [facility]) [A41.9] Patient Active Problem List   Diagnosis Date Noted   Sepsis secondary to UTI (HCC) 05/29/2023   Severe sepsis (HCC) 05/28/2023   Anemia 03/12/2023   Pain due to onychomycosis of toenails of both feet 01/07/2023   Encounter for long-term opiate analgesic use 11/15/2021   Pain management contract signed 09/18/2021   Calciphylaxis of left lower extremity with nonhealing ulcer with necrosis of muscle (HCC) 09/18/2021   Chronic pain syndrome 09/18/2021   Hypocalcemia 06/19/2021   Nausea 05/19/2021   Metabolic encephalopathy  02/02/2021   End stage renal disease (HCC) 02/02/2021   Hypoglycemia 02/02/2021   Sepsis (HCC) 02/02/2021   E. Faecalis and Staph Aureus UTI 02/02/2021   Vascular dialysis catheter in place [facility])    Shingles 01/14/2021   Pressure injury of skin 01/09/2021   SVT (supraventricular tachycardia) (HCC) 01/07/2021   Acute renal failure superimposed on stage 3b chronic kidney disease (HCC) 01/03/2021   Acute metabolic encephalopathy 01/03/2021   COVID-19 virus infection 01/03/2021   Metabolic  acidosis    AKI (acute kidney injury) (HCC) 12/23/2020   Hyperkalemia 12/02/2018   Fever, unknown origin 12/02/2018   Altered mental state 12/02/2018   Acute on chronic renal failure (HCC) 12/02/2018   Pancreatic insufficiency 12/02/2018   Wears hearing aid 12/02/2018   HOH (hard of hearing) 12/02/2018   DM type 2 with diabetic peripheral neuropathy (HCC) 09/15/2018   Uncontrolled type 2 diabetes mellitus with hyperglycemia (HCC) 09/15/2018   Peripheral edema 06/25/2018   NAFLD (nonalcoholic fatty liver disease)    Diabetic retinopathy (HCC)    ED (erectile dysfunction) 01/05/2014   Shortness of breath 12/05/2011   Coronary atherosclerosis of native coronary artery 12/05/2011   Type 2 diabetes mellitus (HCC) 12/05/2011   Essential hypertension, benign 12/05/2011   Mixed hyperlipidemia 12/05/2011   Malignant neoplasm of prostate (HCC) 06/26/2011   PCP:  Donita Brooks, MD Pharmacy:   [facility] [phone] - [place], [place] - [address] AT [facility] [address] [place] [address] Phone: [phone] Fax: [phone]     Social Determinants of Health (SDOH) Social History: SDOH Screenings   Food Insecurity: No Food Insecurity (05/29/2023)  Housing: Low Risk  (05/29/2023)  Transportation Needs: No Transportation Needs (05/29/2023)  Utilities: Not At Risk (05/29/2023)  Alcohol Screen: Low Risk  (08/22/2022)  Depression (PHQ2-9): Low Risk  (12/31/2022)  Financial Resource Strain: Low Risk  (08/22/2022)  Physical Activity: Inactive (08/22/2022)  Social Connections: Moderately Isolated (08/22/2022)  Stress: No Stress Concern Present (08/22/2022)  Tobacco Use: Low Risk  (05/29/2023)   SDOH Interventions:     Readmission Risk Interventions    05/29/2023    2:05 PM 04/18/2021   12:16 PM 04/16/2021    3:07 PM  Readmission Risk Prevention Plan  Transportation Screening Complete  Complete  HRI or Home Care Consult Complete    Social  Work Consult for Recovery Care Planning/Counseling Complete    Palliative Care Screening Not Applicable    Medication Review Oceanographer) Complete  Complete  HRI or Home Care Consult   Complete  SW Recovery Care/Counseling Consult   Complete  Palliative Care Screening   Not Applicable  Skilled Nursing Facility  Not Applicable Not Complete

## 2023-05-29 NOTE — Plan of Care (Signed)
  Problem: Fluid Volume: Goal: Hemodynamic stability will improve Outcome: Progressing   Problem: Clinical Measurements: Goal: Diagnostic test results will improve Outcome: Progressing   Problem: Clinical Measurements: Goal: Signs and symptoms of infection will decrease Outcome: Progressing

## 2023-05-29 NOTE — Anesthesia Procedure Notes (Signed)
Procedure Name: LMA Insertion Date/Time: 05/29/2023 1:49 PM  Performed by: Jeanette Caprice, CRNAPre-anesthesia Checklist: Patient identified, Emergency Drugs available, Suction available and Patient being monitored Patient Re-evaluated:Patient Re-evaluated prior to induction Oxygen Delivery Method: Circle system utilized Preoxygenation: Pre-oxygenation with 100% oxygen Induction Type: Combination inhalational/ intravenous induction and IV induction Ventilation: Mask ventilation without difficulty LMA: LMA inserted LMA Size: 5.0 Tube type: Oral Number of attempts: 1 Placement Confirmation: positive ETCO2 and breath sounds checked- equal and bilateral Tube secured with: Tape Dental Injury: Teeth and Oropharynx as per pre-operative assessment

## 2023-05-29 NOTE — Patient Instructions (Signed)
Urinary Tract Infection, Adult  A urinary tract infection (UTI) is an infection of any part of the urinary tract. The urinary tract includes the kidneys, ureters, bladder, and urethra. These organs make, store, and get rid of urine in the body. An upper UTI affects the ureters and kidneys. A lower UTI affects the bladder and urethra. What are the causes? Most urinary tract infections are caused by bacteria in your genital area around your urethra, where urine leaves your body. These bacteria grow and cause inflammation of your urinary tract. What increases the risk? You are more likely to develop this condition if: You have a urinary catheter that stays in place. You are not able to control when you urinate or have a bowel movement (incontinence). You are male and you: Use a spermicide or diaphragm for birth control. Have low estrogen levels. Are pregnant. You have certain genes that increase your risk. You are sexually active. You take antibiotic medicines. You have a condition that causes your flow of urine to slow down, such as: An enlarged prostate, if you are male. Blockage in your urethra. A kidney stone. A nerve condition that affects your bladder control (neurogenic bladder). Not getting enough to drink, or not urinating often. You have certain medical conditions, such as: Diabetes. A weak disease-fighting system (immunesystem). Sickle cell disease. Gout. Spinal cord injury. What are the signs or symptoms? Symptoms of this condition include: Needing to urinate right away (urgency). Frequent urination. This may include small amounts of urine each time you urinate. Pain or burning with urination. Blood in the urine. Urine that smells bad or unusual. Trouble urinating. Cloudy urine. Vaginal discharge, if you are male. Pain in the abdomen or the lower back. You may also have: Vomiting or a decreased appetite. Confusion. Irritability or tiredness. A fever or  chills. Diarrhea. The first symptom in older adults may be confusion. In some cases, they may not have any symptoms until the infection has worsened. How is this diagnosed? This condition is diagnosed based on your medical history and a physical exam. You may also have other tests, including: Urine tests. Blood tests. Tests for STIs (sexually transmitted infections). If you have had more than one UTI, a cystoscopy or imaging studies may be done to determine the cause of the infections. How is this treated? Treatment for this condition includes: Antibiotic medicine. Over-the-counter medicines to treat discomfort. Drinking enough water to stay hydrated. If you have frequent infections or have other conditions such as a kidney stone, you may need to see a health care provider who specializes in the urinary tract (urologist). In rare cases, urinary tract infections can cause sepsis. Sepsis is a life-threatening condition that occurs when the body responds to an infection. Sepsis is treated in the hospital with IV antibiotics, fluids, and other medicines. Follow these instructions at home:  Medicines Take over-the-counter and prescription medicines only as told by your health care provider. If you were prescribed an antibiotic medicine, take it as told by your health care provider. Do not stop using the antibiotic even if you start to feel better. General instructions Make sure you: Empty your bladder often and completely. Do not hold urine for long periods of time. Empty your bladder after sex. Wipe from front to back after urinating or having a bowel movement if you are male. Use each tissue only one time when you wipe. Drink enough fluid to keep your urine pale yellow. Keep all follow-up visits. This is important. Contact a health   care provider if: Your symptoms do not get better after 1-2 days. Your symptoms go away and then return. Get help right away if: You have severe pain in  your back or your lower abdomen. You have a fever or chills. You have nausea or vomiting. Summary A urinary tract infection (UTI) is an infection of any part of the urinary tract, which includes the kidneys, ureters, bladder, and urethra. Most urinary tract infections are caused by bacteria in your genital area. Treatment for this condition often includes antibiotic medicines. If you were prescribed an antibiotic medicine, take it as told by your health care provider. Do not stop using the antibiotic even if you start to feel better. Keep all follow-up visits. This is important. This information is not intended to replace advice given to you by your health care provider. Make sure you discuss any questions you have with your health care provider. Document Revised: 02/27/2020 Document Reviewed: 03/03/2020 Elsevier Patient Education  2024 [employer]  

## 2023-05-29 NOTE — Evaluation (Signed)
Physical Therapy Evaluation Patient Details Name: Mark MATEL Sr. MRN: [id number] DOB: 04-Jun-1941 Today's Date: 05/29/2023  History of Present Illness  [name]  is a 82 y.o. male, ith a history of type 2 diabetes on insulin, hypertension, ESRD requiring hemodialysis   -Patient was sent by his HD center for hypoglycemia, patient did receive a full treatment yesterday, he had altered mental status, shaking, CBG at HD center where 58, he was given oral glucose, recheck was at 61, received another dose of oral glucose and brought to ED for further evaluation, CBG in ED was at 84, he received D50, and it remained persistently> 100 after that, patient reports generalized weakness, fatigue, low-grade temperature at home with chills, he has been followed by [name] for UTI, he has been on doxycycline, and switched to nitrofurantoin recently.   Clinical Impression  Patient has difficulty completing sit to stands from bedside and commode due to BLE weakness, able to ambulate in room with slow labored movement without loss of balance and limited mostly due to c/o fatigue and generalized weakness.  Patient tolerated sitting up in chair after therapy with his spouse present.  Patient will benefit from continued skilled physical therapy in hospital and recommended venue below to increase strength, balance, endurance for safe ADLs and gait.          If plan is discharge home, recommend the following: A little help with walking and/or transfers;A little help with bathing/dressing/bathroom;Help with stairs or ramp for entrance;Assistance with cooking/housework   Can travel by private vehicle        Equipment Recommendations None recommended by PT  Recommendations for Other Services       Functional Status Assessment Patient has had a recent decline in their functional status and demonstrates the ability to make significant improvements in function in a reasonable and predictable amount of  time.     Precautions / Restrictions Precautions Precautions: Fall Restrictions Weight Bearing Restrictions: No      Mobility  Bed Mobility Overal bed mobility: Needs Assistance Bed Mobility: Supine to Sit     Supine to sit: Contact guard, HOB elevated     General bed mobility comments: increased time with labored movement    Transfers Overall transfer level: Needs assistance Equipment used: Rolling walker (2 wheels) Transfers: Sit to/from Stand, Bed to chair/wheelchair/BSC Sit to Stand: Min assist   Step pivot transfers: Contact guard assist, Min assist       General transfer comment: had difficulty sitting up from bedside and low commode due to BLE weakness    Ambulation/Gait Ambulation/Gait assistance: Contact guard assist Gait Distance (Feet): 15 Feet Assistive device: Rolling walker (2 wheels) Gait Pattern/deviations: Decreased step length - left, Decreased stance time - right, Decreased stride length, Trunk flexed Gait velocity: decreased     General Gait Details: slow labored slightly unsteady movement without loss of balance, limited mostly due to c/o fatigue  Stairs            Wheelchair Mobility     Tilt Bed    Modified Rankin (Stroke Patients Only)       Balance Overall balance assessment: Needs assistance Sitting-balance support: Feet supported, No upper extremity supported Sitting balance-Leahy Scale: Good Sitting balance - Comments: seated at EOB   Standing balance support: Reliant on assistive device for balance, During functional activity, Bilateral upper extremity supported Standing balance-Leahy Scale: Fair Standing balance comment: using RW  Pertinent Vitals/Pain Pain Assessment Pain Assessment: No/denies pain    Home Living Family/patient expects to be discharged to:: Private residence Living Arrangements: Spouse/significant other Available Help at Discharge: Family;Available 24  hours/day Type of Home: House Home Access: Ramped entrance       Home Layout: One level Home Equipment: Agricultural consultant (2 wheels);Cane - single point;Grab bars - tub/shower;BSC/3in1;Shower seat - built in      Prior Function Prior Level of Function : Needs assist       Physical Assist : Mobility (physical);ADLs (physical) Mobility (physical): Bed mobility;Transfers;Gait;Stairs   Mobility Comments: Household ambulator using RW ADLs Comments: Assisted by family     Extremity/Trunk Assessment   Upper Extremity Assessment Upper Extremity Assessment: Defer to OT evaluation    Lower Extremity Assessment Lower Extremity Assessment: Generalized weakness    Cervical / Trunk Assessment Cervical / Trunk Assessment: Kyphotic  Communication   Communication Communication: Hearing impairment (Good ear on left side) Cueing Techniques: Verbal cues;Tactile cues  Cognition Arousal: Alert Behavior During Therapy: WFL for tasks assessed/performed Overall Cognitive Status: Within Functional Limits for tasks assessed                                          General Comments      Exercises     Assessment/Plan    PT Assessment Patient needs continued PT services  PT Problem List Decreased strength;Decreased activity tolerance;Decreased balance;Decreased mobility       PT Treatment Interventions DME instruction;Gait training;Stair training;Functional mobility training;Therapeutic activities;Therapeutic exercise;Balance training;Patient/family education    PT Goals (Current goals can be found in the Care Plan section)  Acute Rehab PT Goals Patient Stated Goal: return home with family to assist PT Goal Formulation: With patient/family Time For Goal Achievement: 06/05/23 Potential to Achieve Goals: Good    Frequency Min 3X/week     Co-evaluation               AM-PAC PT "6 Clicks" Mobility  Outcome Measure Help needed turning from your back to your  side while in a flat bed without using bedrails?: None Help needed moving from lying on your back to sitting on the side of a flat bed without using bedrails?: A Little Help needed moving to and from a bed to a chair (including a wheelchair)?: A Little Help needed standing up from a chair using your arms (e.g., wheelchair or bedside chair)?: A Little Help needed to walk in hospital room?: A Little Help needed climbing 3-5 steps with a railing? : A Lot 6 Click Score: 18    End of Session   Activity Tolerance: Patient tolerated treatment well;Patient limited by fatigue Patient left: in chair;with call bell/phone within reach;with family/visitor present Nurse Communication: Mobility status PT Visit Diagnosis: Unsteadiness on feet (R26.81);Other abnormalities of gait and mobility (R26.89);Muscle weakness (generalized) (M62.81)    Time: 1610-9604 PT Time Calculation (min) (ACUTE ONLY): 31 min   Charges:   PT Evaluation $PT Eval Moderate Complexity: 1 Mod PT Treatments $Therapeutic Activity: 23-37 mins PT General Charges $$ ACUTE PT VISIT: 1 Visit         2:08 PM, 05/29/23 Ocie Bob, MPT Physical Therapist with [facility] [phone] office [phone] mobile phone

## 2023-05-29 NOTE — Plan of Care (Signed)
  Problem: Acute Rehab PT Goals(only PT should resolve) Goal: Pt Will Go Supine/Side To Sit Outcome: Progressing Flowsheets (Taken 05/29/2023 1408) Pt will go Supine/Side to Sit: with supervision Goal: Patient Will Transfer Sit To/From Stand Outcome: Progressing Flowsheets (Taken 05/29/2023 1408) Patient will transfer sit to/from stand:  with supervision  with contact guard assist Goal: Pt Will Transfer Bed To Chair/Chair To Bed Outcome: Progressing Flowsheets (Taken 05/29/2023 1408) Pt will Transfer Bed to Chair/Chair to Bed:  with supervision  with contact guard assist Goal: Pt Will Ambulate Outcome: Progressing Flowsheets (Taken 05/29/2023 1408) Pt will Ambulate:  50 feet  with contact guard assist  with rolling walker  with supervision   2:09 PM, 05/29/23 Ocie Bob, MPT Physical Therapist with [facility] [phone] office [phone] mobile phone

## 2023-05-29 NOTE — Progress Notes (Signed)
Progress Note   Patient: [name] UEA:[id number] DOB: 02/11/1941 DOA: 05/28/2023     1 DOS: the patient was seen and examined on 05/29/2023   Brief hospital admission course:   As per H&P written by Dr. Randol Kern on 05/28/2023 [place] Scaff  is a 82 y.o. male, ith a history of type 2 diabetes on insulin, hypertension, ESRD requiring hemodialysis  -Patient was sent by his HD center for hypoglycemia, patient did receive a full treatment yesterday, he had altered mental status, shaking, CBG at HD center where 58, he was given oral glucose, recheck was at 61, received another dose of oral glucose and brought to ED for further evaluation, CBG in ED was at 84, he received D50, and it remained persistently> 100 after that, patient reports generalized weakness, fatigue, low-grade temperature at home with chills, he has been followed by Dr. Ronne Binning for UTI, he has been on doxycycline, and switched to nitrofurantoin recently. -In ED his workup significant for sepsis pressure initially soft, but this has responded to 1 L of fluid bolus, tachycardic, difficult leukocytosis, elevated lactic acid, positive UA, CT kidney stone significant for right kidney nephrosis, with gas, and distal stone obstruction largest at 4 mm, [facility] hospitalist consulted to admit    Assessment and Plan:  1-severe sepsis secondary to complicated UTI with infected kidney stone and right-sided hydronephrosis -Patient may criteria for severe sepsis at time of admission with elevated leukocytosis, lactic acidosis, hypotension and identified source of infection which was his urine. -Cultures demonstrating presence of E. coli and Enterobacterales -Urology service has been consulted and will follow recommendation -Continue current antibiotics and follow final culture results. -Continue to maintain adequate hydration and supportive care.    2-type 2 diabetes mellitus insulin-dependent -Current A1c 6.5 -Continue sliding scale  insulin and follow CBG fluctuation to further adjust hypoglycemic regimen as needed.  3-ESRD -Normal schedule Monday, Wednesday and Friday -Nephrology service will be consulted for hemodialysis treatment while inpatient (next treatment 05/30/2023) -Electrolytes appear stable.  4-hyperlipidemia -Continue statin.  5-large stool burden appreciated on imaging -Patient reports no abdominal pain nausea vomiting -Continue the use of laxative for bowel regimen.  6-hypertension -Presenting with soft blood pressure after hemodialysis treatment and the presence of sepsis -Will hold antihypertensive agents for now -Continue the use of midodrine -Follow vital signs.  7-history of palpitations/SVT -Currently stable -Continue telemetry monitoring -Planning to resume the use of Cardizem once blood pressure stabilized.   Subjective:    In no acute distress, no chest pain, no nausea, no vomiting.  Physical Exam: Vitals:   05/29/23 1700 05/29/23 1709 05/29/23 1730 05/29/23 1800  BP: 133/66  (!) 124/45 (!) 135/58  Pulse:      Resp: 17 19 17 16   Temp:      TempSrc:      SpO2:      Weight:      Height:       General exam: Alert, awake, following commands appropriately; no acute distress. Reports no chest pain or shortness of breath. Respiratory system:   Good air movement, no using accessory muscles. Cardiovascular system:  RRR, no rubs, no gallops. Gastrointestinal system: Abdomen is   Soft, nontender, nondistended, positive bowel sounds. Central nervous system: Alert and oriented. No focal neurological deficits. Extremities: No   Cyanosis or clubbing. Skin: No petechiae. Psychiatry: Mood & affect appropriate.   Data Reviewed: Lactic acid: 7.2>>>5.0>>2.3>>>1.7 CBC: WBCs 29.4, hemoglobin 12.1 and platelet count 118K A1c: 6.5 Blood culture ID panel: Demonstrating Enterobacterales and  E. coli (final sensitivity pending) Renal function panel: Sodium 132, potassium 3.6, chloride 97,  bicarb 25, glucose 207, BUN 2 3, creatinine 2.76 and GFR 22.  Family Communication: No family at bedside.  Disposition: Status is: Inpatient Remains inpatient appropriate because: Continue IV antibiotics.  Planned Discharge Destination: Home   Time spent: 55 minutes  Author: Vassie Loll, MD 05/29/2023 6:17 PM  For on call review [email or web address].

## 2023-05-29 NOTE — Transfer of Care (Signed)
Immediate Anesthesia Transfer of Care Note  Patient: Mark Art Sr.  Procedure(s) Performed: CYSTOSCOPY WITH RETROGRADE PYELOGRAM/URETERAL STENT PLACEMENT (Right: Ureter)  Patient Location: PACU  Anesthesia Type:General  Level of Consciousness: drowsy, patient cooperative, and responds to stimulation  Airway & Oxygen Therapy: Patient Spontanous Breathing and Patient connected to face mask oxygen  Post-op Assessment: Report given to RN and Post -op Vital signs reviewed and stable  Post vital signs: Reviewed and stable  Last Vitals:  Vitals Value Taken Time  BP 96/58 05/29/23 1430  Temp 36.6 C 05/29/23 1430  Pulse 98 05/29/23 1433  Resp 11 05/29/23 1433  SpO2 100 % 05/29/23 1433  Vitals shown include unfiled device data.  Last Pain:  Vitals:   05/29/23 1255  TempSrc: Oral  PainSc: 0-No pain      Patients Stated Pain Goal: 4 (05/29/23 1255)  Complications: No notable events documented.

## 2023-05-29 NOTE — Progress Notes (Signed)
PHARMACY - PHYSICIAN COMMUNICATION CRITICAL VALUE ALERT - BLOOD CULTURE IDENTIFICATION (BCID)  Mark L Breton Sr. is an 82 y.o. male who presented to [facility] on 05/28/2023   Assessment:  E. Coli bacteremia w/out resistance    Current antibiotics: Ceftriaxone 2 grams IV daily  Changes to prescribed antibiotics recommended:  Patient is on recommended antibiotics - No changes needed  Results for orders placed or performed during the hospital encounter of 05/28/23  Blood Culture ID Panel (Reflexed) (Collected: 05/28/2023  4:26 PM)  Result Value Ref Range   Enterococcus faecalis NOT DETECTED NOT DETECTED   Enterococcus Faecium NOT DETECTED NOT DETECTED   Listeria monocytogenes NOT DETECTED NOT DETECTED   Staphylococcus species NOT DETECTED NOT DETECTED   Staphylococcus aureus (BCID) NOT DETECTED NOT DETECTED   Staphylococcus epidermidis NOT DETECTED NOT DETECTED   Staphylococcus lugdunensis NOT DETECTED NOT DETECTED   Streptococcus species NOT DETECTED NOT DETECTED   Streptococcus agalactiae NOT DETECTED NOT DETECTED   Streptococcus pneumoniae NOT DETECTED NOT DETECTED   Streptococcus pyogenes NOT DETECTED NOT DETECTED   A.calcoaceticus-baumannii NOT DETECTED NOT DETECTED   Bacteroides fragilis NOT DETECTED NOT DETECTED   Enterobacterales DETECTED (A) NOT DETECTED   Enterobacter cloacae complex NOT DETECTED NOT DETECTED   Escherichia coli DETECTED (A) NOT DETECTED   Klebsiella aerogenes NOT DETECTED NOT DETECTED   Klebsiella oxytoca NOT DETECTED NOT DETECTED   Klebsiella pneumoniae NOT DETECTED NOT DETECTED   Proteus species NOT DETECTED NOT DETECTED   Salmonella species NOT DETECTED NOT DETECTED   Serratia marcescens NOT DETECTED NOT DETECTED   Haemophilus influenzae NOT DETECTED NOT DETECTED   Neisseria meningitidis NOT DETECTED NOT DETECTED   Pseudomonas aeruginosa NOT DETECTED NOT DETECTED   Stenotrophomonas maltophilia NOT DETECTED NOT DETECTED   Candida albicans NOT  DETECTED NOT DETECTED   Candida auris NOT DETECTED NOT DETECTED   Candida glabrata NOT DETECTED NOT DETECTED   Candida krusei NOT DETECTED NOT DETECTED   Candida parapsilosis NOT DETECTED NOT DETECTED   Candida tropicalis NOT DETECTED NOT DETECTED   Cryptococcus neoformans/gattii NOT DETECTED NOT DETECTED   CTX-M ESBL NOT DETECTED NOT DETECTED   Carbapenem resistance IMP NOT DETECTED NOT DETECTED   Carbapenem resistance KPC NOT DETECTED NOT DETECTED   Carbapenem resistance NDM NOT DETECTED NOT DETECTED   Carbapenem resist OXA 48 LIKE NOT DETECTED NOT DETECTED   Carbapenem resistance VIM NOT DETECTED NOT DETECTED    Tad Moore 05/29/2023  12:26 PM

## 2023-05-29 NOTE — Anesthesia Postprocedure Evaluation (Signed)
Anesthesia Post Note  Patient: Mark Art Sr.  Procedure(s) Performed: CYSTOSCOPY WITH RETROGRADE PYELOGRAM/URETERAL STENT PLACEMENT (Right: Ureter)  Patient location during evaluation: PACU Anesthesia Type: General Level of consciousness: awake and alert Pain management: pain level controlled Vital Signs Assessment: post-procedure vital signs reviewed and stable Respiratory status: spontaneous breathing, nonlabored ventilation, respiratory function stable and patient connected to nasal cannula oxygen Cardiovascular status: blood pressure returned to baseline and stable Postop Assessment: no apparent nausea or vomiting Anesthetic complications: no   There were no known notable events for this encounter.   Last Vitals:  Vitals:   05/29/23 1430 05/29/23 1445  BP: (!) 96/58 (!) 108/56  Pulse: 100 93  Resp: 12 12  Temp: 36.6 C   SpO2: 100% 100%    Last Pain:  Vitals:   05/29/23 1430  TempSrc:   PainSc: Asleep                 [name] L [name]

## 2023-05-29 NOTE — Op Note (Signed)
.  Preoperative diagnosis: right UPJ stone, sepsis  Postoperative diagnosis: Same  Procedure: 1 cystoscopy 2. right retrograde pyelography 3.  Intraoperative fluoroscopy, under one hour, with interpretation 4. right 6 x 26 JJ stent placement  Attending: Wilkie Aye  Anesthesia: General  Estimated blood loss: None  Drains: Right 6 x 26 JJ ureteral stent without tether, 16 French foley catheter  Specimens: none  Antibiotics: rocephin  Findings:right mid ureteral stone. severe hydronephrosis. No masses/lesions in the bladder. Ureteral orifices in normal anatomic location.  Indications: Patient is a 82 year old male with a history of right ureteral stone and concern for sepsis.  After discussing treatment options, they decided proceed with left stent placement.  Procedure in detail: The patient was brought to the operating room and a brief timeout was done to ensure correct patient, correct procedure, correct site.  General anesthesia was administered patient was placed in dorsal lithotomy position.  Their genitalia was then prepped and draped in usual sterile fashion.  A rigid 22 French cystoscope was passed in the urethra and the bladder.  Bladder was inspected free masses or lesions.  the ureteral orifices were in the normal orthotopic locations.  a 6 french ureteral catheter was then instilled into the right ureteral orifice.  a gentle retrograde was obtained and findings noted above.  we then placed a zip wire through the ureteral catheter and advanced up to the renal pelvis.    We then placed a 6 x 26 double-j ureteral stent over the original zip wire.  We then removed the wire and good coil was noted in the the renal pelvis under fluoroscopy and the bladder under direct vision.  A foley catheter was then placed. the bladder was then drained and this concluded the procedure which was well tolerated by patient.  Complications: None  Condition: Stable, extubated, transferred to  PACU  Plan: Patient is to be admitted for IV antibiotics. He will have his stone extraction in 2 weeks.

## 2023-05-29 NOTE — H&P (Signed)
Urology Consult  Referring physician: Dr. Randol Kern Reason for referral: right hydronephrosis, right ureteral calculus  Chief Complaint: right flank pain  History of Present Illness: [name] is a 82yo with a history of ESRD, CAD, DMII, and nephrolithiasis who presented to [facility] ER with a 2 day history of worsening confusion and right flank pain. He has a remote history of nephrolithiasis with his last stone event over 20 years ago. The pain started as dull and then became sharp yesterday. He also had increased fatigue prompting his presentation to the ER. He was seen in my office 1 week ago, was diagnosed with a UTI, and was started on antibiotics. CT scan obtained in the ER shows a 4-82mm right mid ureteral calculus with gas in the collecting system proximal to the calculus. WBC count 29. The patient makes only a couple ounces of urine daily  Past Medical History:  Diagnosis Date   Arthritis    lower back   Chronic back pain    Disc disease   Chronic kidney disease    Coronary atherosclerosis of native coronary artery    Coronary calcifications by chest CT, Myoview demonstrating inferolateral scar   Deafness in right ear    Diabetes mellitus    Diabetic retinopathy (HCC)    moderate nonproliferative with macular edema in right eye   Diastolic dysfunction 12/2011   Grade 1.   Essential hypertension, benign    Heart murmur    in past   HOH (hard of hearing)    Hyperkalemia    Kidney stones    Mixed hyperlipidemia    NAFLD (nonalcoholic fatty liver disease)    Pancreatic insufficiency    Diagnosed at [facility]   Prostate cancer [facility]) 2011   Shortness of breath dyspnea    occasional - liver pressing on right lung, decreased capacity   Subdural hematoma (HCC)    Type 2 diabetes mellitus (HCC)    Wears hearing aid    left   Past Surgical History:  Procedure Laterality Date   APPENDECTOMY     AV FISTULA PLACEMENT Left 05/15/2021   Procedure: INSERTION OF LEFT ARM  ARTERIOVENOUS (AV) GORE-TEX GRAFT;  Surgeon: Larina Earthly, MD;  Location: [facility];  Service: Vascular;  Laterality: Left;   Bilateral hip replacement     CATARACT EXTRACTION W/PHACO Left 05/15/2015   Procedure: CATARACT EXTRACTION PHACO AND INTRAOCULAR LENS PLACEMENT (IOC);  Surgeon: Sherald Hess, MD;  Location: [facility];  Service: Ophthalmology;  Laterality: Left;  DIABETIC - insulin pump and oral meds   CATARACT EXTRACTION W/PHACO Right 08/28/2015   Procedure: CATARACT EXTRACTION PHACO AND INTRAOCULAR LENS PLACEMENT (IOC);  Surgeon: Sherald Hess, MD;  Location: [facility];  Service: Ophthalmology;  Laterality: Right;  DIABETIC PER PT AND CINDY PLEASE KEEP ARRIVAL TIME AFTER 8AM    CHOLECYSTECTOMY     HERNIA REPAIR     INSERTION OF DIALYSIS CATHETER Right 01/22/2021   Procedure: INSERTION OF DIALYSIS CATHETER;  Surgeon: Lucretia Roers, MD;  Location: [facility];  Service: General;  Laterality: Right;   INSERTION OF DIALYSIS CATHETER Right 04/16/2021   Procedure: INSERTION OF DIALYSIS CATHETER;  Surgeon: Lucretia Roers, MD;  Location: [facility];  Service: General;  Laterality: Right;   PROSTATECTOMY  2011    Medications: I have reviewed the patient's current medications. Allergies:  Allergies  Allergen Reactions   Enalapril Maleate Cough    Family History  Problem Relation Age of Onset   Diabetes  type II Mother    Heart attack Father    Social History:  reports that he has never smoked. He has never used smokeless tobacco. He reports that he does not drink alcohol and does not use drugs.  Review of Systems  Constitutional:  Positive for chills and fatigue.  Genitourinary:  Positive for flank pain.  Psychiatric/Behavioral:  Positive for confusion.   All other systems reviewed and are negative.   Physical Exam:  Vital signs in last 24 hours: Temp:  [96.5 F (35.8 C)-99.5 F (37.5 C)] 97.9 F (36.6 C) (10/24 1255) Pulse Rate:  [77-142]  88 (10/24 1255) Resp:  [11-31] 14 (10/24 1255) BP: (97-152)/(42-72) 152/62 (10/24 1255) SpO2:  [85 %-100 %] 98 % (10/24 1303) Weight:  [65.6 kg-66.7 kg] 66.7 kg (10/24 1255) Physical Exam Vitals reviewed.  Constitutional:      Appearance: Normal appearance.  HENT:     Head: Normocephalic and atraumatic.     Nose: Nose normal. No congestion.     Mouth/Throat:     Mouth: Mucous membranes are moist.     Pharynx: Oropharynx is clear.  Eyes:     Extraocular Movements: Extraocular movements intact.     Pupils: Pupils are equal, round, and reactive to light.  Cardiovascular:     Rate and Rhythm: Regular rhythm. Tachycardia present.  Pulmonary:     Effort: Pulmonary effort is normal. No respiratory distress.  Abdominal:     General: Abdomen is flat. There is no distension.  Musculoskeletal:        General: No swelling. Normal range of motion.     Cervical back: Normal range of motion and neck supple.  Skin:    General: Skin is warm and dry.  Neurological:     General: No focal deficit present.     Mental Status: He is alert.  Psychiatric:        Mood and Affect: Mood normal.        Behavior: Behavior normal.        Thought Content: Thought content normal.        Judgment: Judgment normal.     Laboratory Data:  Results for orders placed or performed during the hospital encounter of 05/28/23 (from the past 72 hour(s))  CBG monitoring, ED     Status: Abnormal   Collection Time: 05/28/23  4:12 PM  Result Value Ref Range   Glucose-Capillary 53 (L) 70 - 99 mg/dL    Comment: Glucose reference range applies only to samples taken after fasting for at least 8 hours.  Lactic acid, plasma     Status: Abnormal   Collection Time: 05/28/23  4:26 PM  Result Value Ref Range   Lactic Acid, Venous 7.2 (HH) 0.5 - 1.9 mmol/L    Comment: CRITICAL RESULT CALLED TO, READ BACK BY AND VERIFIED WITH BAYNE,B ON 05/28/23 AT 1746 BY LOY,C Performed at [facility], [address] [place], [place]  [address]   Culture, blood (Routine x 2)     Status: None (Preliminary result)   Collection Time: 05/28/23  4:26 PM   Specimen: BLOOD RIGHT FOREARM  Result Value Ref Range   Specimen Description      BLOOD RIGHT FOREARM Performed at [facility], [address] [place], [place] [address]    Special Requests      BOTTLES DRAWN AEROBIC AND ANAEROBIC Blood Culture adequate volume Performed at [facility], [address] [place], [place] [address]    Culture  Setup Time  GRAM NEGATIVE RODS IN BOTH AEROBIC AND ANAEROBIC BOTTLES CRITICAL RESULT CALLED TO, READ BACK BY AND VERIFIED WITH: JESSICA FERRAINOLO [phone] [id number], VIRAY,J CRITICAL RESULT CALLED TO, READ BACK BY AND VERIFIED WITH: PHARMD STEVEN H 1220 [id number] FCP Performed at [facility], [address] [place], [place] [address]    Culture GRAM NEGATIVE RODS    Report Status PENDING   CBC with Differential/Platelet     Status: Abnormal   Collection Time: 05/28/23  4:26 PM  Result Value Ref Range   WBC 29.4 (H) 4.0 - 10.5 K/uL    Comment: WHITE COUNT CONFIRMED ON SMEAR   RBC 3.62 (L) 4.22 - 5.81 MIL/uL   Hemoglobin 12.1 (L) 13.0 - 17.0 g/dL   HCT 70.2 (L) 63.7 - 85.8 %   MCV 105.5 (H) 80.0 - 100.0 fL   MCH 33.4 26.0 - 34.0 pg   MCHC 31.7 30.0 - 36.0 g/dL   RDW 85.0 27.7 - 41.2 %   Platelets 118 (L) 150 - 400 K/uL    Comment: SPECIMEN CHECKED FOR CLOTS REPEATED TO VERIFY    nRBC 0.0 0.0 - 0.2 %   Neutrophils Relative % 97 %   Neutro Abs 28.5 (H) 1.7 - 7.7 K/uL   Lymphocytes Relative 1 %   Lymphs Abs 0.3 (L) 0.7 - 4.0 K/uL   Monocytes Relative 1 %   Monocytes Absolute 0.3 0.1 - 1.0 K/uL   Eosinophils Relative 0 %   Eosinophils Absolute 0.0 0.0 - 0.5 K/uL   Basophils Relative 0 %   Basophils Absolute 0.1 0.0 - 0.1 K/uL   RBC Morphology MORPHOLOGY UNREMARKABLE    Smear Review PLATELET COUNT CONFIRMED BY SMEAR    Immature Granulocytes 1 %   Abs Immature Granulocytes 0.22 (H) 0.00 - 0.07 K/uL    Comment:  Performed at [facility], [address], [place], [place] [address]  Hemoglobin A1c     Status: Abnormal   Collection Time: 05/28/23  4:26 PM  Result Value Ref Range   Hgb A1c MFr Bld 6.5 (H) 4.8 - 5.6 %    Comment: (NOTE) Pre diabetes:          5.7%-6.4%  Diabetes:              >6.4%  Glycemic control for   <7.0% adults with diabetes    Mean Plasma Glucose 139.85 mg/dL    Comment: Performed at [facility], [address], [place], [place] [address]  Blood Culture ID Panel (Reflexed)     Status: Abnormal   Collection Time: 05/28/23  4:26 PM  Result Value Ref Range   Enterococcus faecalis NOT DETECTED NOT DETECTED   Enterococcus Faecium NOT DETECTED NOT DETECTED   Listeria monocytogenes NOT DETECTED NOT DETECTED   Staphylococcus species NOT DETECTED NOT DETECTED   Staphylococcus aureus (BCID) NOT DETECTED NOT DETECTED   Staphylococcus epidermidis NOT DETECTED NOT DETECTED   Staphylococcus lugdunensis NOT DETECTED NOT DETECTED   Streptococcus species NOT DETECTED NOT DETECTED   Streptococcus agalactiae NOT DETECTED NOT DETECTED   Streptococcus pneumoniae NOT DETECTED NOT DETECTED   Streptococcus pyogenes NOT DETECTED NOT DETECTED   A.calcoaceticus-baumannii NOT DETECTED NOT DETECTED   Bacteroides fragilis NOT DETECTED NOT DETECTED   Enterobacterales DETECTED (A) NOT DETECTED    Comment: Enterobacterales represent a large order of gram negative bacteria, not a single organism. CRITICAL RESULT CALLED TO, READ BACK BY AND VERIFIED WITH: PHARMD STEVEN H [id number] FCP    Enterobacter  cloacae complex NOT DETECTED NOT DETECTED   Escherichia coli DETECTED (A) NOT DETECTED    Comment: CRITICAL RESULT CALLED TO, READ BACK BY AND VERIFIED WITH: PHARMD STEVEN H 1220 [id number] FCP    Klebsiella aerogenes NOT DETECTED NOT DETECTED   Klebsiella oxytoca NOT DETECTED NOT DETECTED   Klebsiella pneumoniae NOT DETECTED NOT DETECTED   Proteus species NOT DETECTED NOT DETECTED    Salmonella species NOT DETECTED NOT DETECTED   Serratia marcescens NOT DETECTED NOT DETECTED   Haemophilus influenzae NOT DETECTED NOT DETECTED   Neisseria meningitidis NOT DETECTED NOT DETECTED   Pseudomonas aeruginosa NOT DETECTED NOT DETECTED   Stenotrophomonas maltophilia NOT DETECTED NOT DETECTED   Candida albicans NOT DETECTED NOT DETECTED   Candida auris NOT DETECTED NOT DETECTED   Candida glabrata NOT DETECTED NOT DETECTED   Candida krusei NOT DETECTED NOT DETECTED   Candida parapsilosis NOT DETECTED NOT DETECTED   Candida tropicalis NOT DETECTED NOT DETECTED   Cryptococcus neoformans/gattii NOT DETECTED NOT DETECTED   CTX-M ESBL NOT DETECTED NOT DETECTED   Carbapenem resistance IMP NOT DETECTED NOT DETECTED   Carbapenem resistance KPC NOT DETECTED NOT DETECTED   Carbapenem resistance NDM NOT DETECTED NOT DETECTED   Carbapenem resist OXA 48 LIKE NOT DETECTED NOT DETECTED   Carbapenem resistance VIM NOT DETECTED NOT DETECTED    Comment: Performed at [facility], [address] [place], [place] [address]  CBG monitoring, ED     Status: Abnormal   Collection Time: 05/28/23  4:44 PM  Result Value Ref Range   Glucose-Capillary 164 (H) 70 - 99 mg/dL    Comment: Glucose reference range applies only to samples taken after fasting for at least 8 hours.  Protime-INR     Status: Abnormal   Collection Time: 05/28/23  4:54 PM  Result Value Ref Range   Prothrombin Time 16.1 (H) 11.4 - 15.2 seconds   INR 1.3 (H) 0.8 - 1.2    Comment: (NOTE) INR goal varies based on device and disease states. Performed at [facility], [address] [place], [place] [address]   Culture, blood (Routine x 2)     Status: None (Preliminary result)   Collection Time: 05/28/23  4:54 PM   Specimen: BLOOD  Result Value Ref Range   Specimen Description BLOOD BLOOD RIGHT HAND    Special Requests      BOTTLES DRAWN AEROBIC ONLY Blood Culture adequate volume   Culture      NO GROWTH < 24  HOURS Performed at [facility], [address] [place], [place] [address]    Report Status PENDING   Comprehensive metabolic panel     Status: Abnormal   Collection Time: 05/28/23  5:38 PM  Result Value Ref Range   Sodium 134 (L) 135 - 145 mmol/L   Potassium 3.9 3.5 - 5.1 mmol/L   Chloride 94 (L) 98 - 111 mmol/L   CO2 24 22 - 32 mmol/L   Glucose, Bld 161 (H) 70 - 99 mg/dL    Comment: Glucose reference range applies only to samples taken after fasting for at least 8 hours.   BUN 14 8 - 23 mg/dL   Creatinine, Ser 9.14 (H) 0.61 - 1.24 mg/dL   Calcium 8.3 (L) 8.9 - 10.3 mg/dL   Total Protein 5.4 (L) 6.5 - 8.1 g/dL   Albumin 2.1 (L) 3.5 - 5.0 g/dL   AST 35 15 - 41 U/L   ALT 39 0 - 44 U/L   Alkaline  Phosphatase 356 (H) 38 - 126 U/L   Total Bilirubin 1.2 0.3 - 1.2 mg/dL   GFR, Estimated 29 (L) >60 mL/min    Comment: (NOTE) Calculated using the CKD-EPI Creatinine Equation (2021)    Anion gap 16 (H) 5 - 15    Comment: Performed at [facility], [address], [place], [place] [address]  Lactic acid, plasma     Status: Abnormal   Collection Time: 05/28/23  6:24 PM  Result Value Ref Range   Lactic Acid, Venous 5.0 (HH) 0.5 - 1.9 mmol/L    Comment: CRITICAL VALUE NOTED.  VALUE IS CONSISTENT WITH PREVIOUSLY REPORTED AND CALLED VALUE. Performed at [facility], [address] [place], [place] [address]   POC CBG, ED     Status: Abnormal   Collection Time: 05/28/23  6:25 PM  Result Value Ref Range   Glucose-Capillary 182 (H) 70 - 99 mg/dL    Comment: Glucose reference range applies only to samples taken after fasting for at least 8 hours.  POC CBG, ED     Status: Abnormal   Collection Time: 05/28/23  7:33 PM  Result Value Ref Range   Glucose-Capillary 176 (H) 70 - 99 mg/dL    Comment: Glucose reference range applies only to samples taken after fasting for at least 8 hours.  Urinalysis, w/ Reflex to Culture (Infection Suspected) -Urine, Clean Catch     Status: Abnormal   Collection  Time: 05/28/23  7:45 PM  Result Value Ref Range   Specimen Source URINE, CATHETERIZED    Color, Urine YELLOW YELLOW   APPearance TURBID (A) CLEAR   Specific Gravity, Urine 1.011 1.005 - 1.030   pH 7.0 5.0 - 8.0   Glucose, UA NEGATIVE NEGATIVE mg/dL   Hgb urine dipstick MODERATE (A) NEGATIVE   Bilirubin Urine NEGATIVE NEGATIVE   Ketones, ur NEGATIVE NEGATIVE mg/dL   Protein, ur 027 (A) NEGATIVE mg/dL   Nitrite NEGATIVE NEGATIVE   Leukocytes,Ua MODERATE (A) NEGATIVE   RBC / HPF 21-50 0 - 5 RBC/hpf   WBC, UA >50 0 - 5 WBC/hpf    Comment:        Reflex urine culture not performed if WBC <=10, OR if Squamous epithelial cells >5. If Squamous epithelial cells >5 suggest recollection.    Bacteria, UA MANY (A) NONE SEEN   Squamous Epithelial / HPF 0-5 0 - 5 /HPF   WBC Clumps PRESENT     Comment: Performed at [facility], [address], [place], [place] [address]  Lactic acid, plasma     Status: Abnormal   Collection Time: 05/29/23  1:12 AM  Result Value Ref Range   Lactic Acid, Venous 2.3 (HH) 0.5 - 1.9 mmol/L    Comment: CRITICAL RESULT CALLED TO, READ BACK BY AND VERIFIED WITH Melynda Keller [phone], VIRAY,J Performed at [facility], [address], [place], [place] [address]   Lactic acid, plasma     Status: None   Collection Time: 05/29/23  5:04 AM  Result Value Ref Range   Lactic Acid, Venous 1.7 0.5 - 1.9 mmol/L    Comment: Performed at [facility], [address] [place], [place] [address]  Renal function panel     Status: Abnormal   Collection Time: 05/29/23  5:04 AM  Result Value Ref Range   Sodium 132 (L) 135 - 145 mmol/L   Potassium 3.6 3.5 - 5.1 mmol/L   Chloride 97 (L) 98 - 111 mmol/L   CO2 25 22 - 32  mmol/L   Glucose, Bld 207 (H) 70 - 99 mg/dL    Comment: Glucose reference range applies only to samples taken after fasting for at least 8 hours.   BUN 23 8 - 23 mg/dL   Creatinine, Ser 9.32 (H) 0.61 - 1.24 mg/dL   Calcium 7.7 (L) 8.9 - 10.3 mg/dL    Phosphorus 4.5 2.5 - 4.6 mg/dL   Albumin 1.8 (L) 3.5 - 5.0 g/dL   GFR, Estimated 22 (L) >60 mL/min    Comment: (NOTE) Calculated using the CKD-EPI Creatinine Equation (2021)    Anion gap 10 5 - 15    Comment: Performed at [facility], [address], [place], [place] [address]  Glucose, capillary     Status: Abnormal   Collection Time: 05/29/23  8:45 AM  Result Value Ref Range   Glucose-Capillary 158 (H) 70 - 99 mg/dL    Comment: Glucose reference range applies only to samples taken after fasting for at least 8 hours.  Glucose, capillary     Status: Abnormal   Collection Time: 05/29/23  8:56 AM  Result Value Ref Range   Glucose-Capillary 144 (H) 70 - 99 mg/dL    Comment: Glucose reference range applies only to samples taken after fasting for at least 8 hours.  MRSA Next Gen by PCR, Nasal     Status: None   Collection Time: 05/29/23  9:07 AM   Specimen: Nasal Mucosa; Nasal Swab  Result Value Ref Range   MRSA by PCR Next Gen NOT DETECTED NOT DETECTED    Comment: (NOTE) The GeneXpert MRSA Assay (FDA approved for NASAL specimens only), is one component of a comprehensive MRSA colonization surveillance program. It is not intended to diagnose MRSA infection nor to guide or monitor treatment for MRSA infections. Test performance is not FDA approved in patients less than 69 years old. Performed at [facility], [address], [place], [place] [address]   Glucose, capillary     Status: Abnormal   Collection Time: 05/29/23 11:05 AM  Result Value Ref Range   Glucose-Capillary 138 (H) 70 - 99 mg/dL    Comment: Glucose reference range applies only to samples taken after fasting for at least 8 hours.   Recent Results (from the past 240 hour(s))  Urine Culture     Status: Abnormal   Collection Time: 05/21/23  2:16 PM   Specimen: Urine   UR  Result Value Ref Range Status   Urine Culture, Routine Final report (A)  Final   Organism ID, Bacteria Escherichia coli (A)  Final    Comment:  Cefazolin <=4 ug/mL Cefazolin with an MIC <=16 predicts susceptibility to the oral agents cefaclor, cefdinir, cefpodoxime, cefprozil, cefuroxime, cephalexin, and loracarbef when used for therapy of uncomplicated urinary tract infections due to E. coli, Klebsiella pneumoniae, and Proteus mirabilis. Greater than 100,000 colony forming units per mL    Antimicrobial Susceptibility Comment  Final    Comment:       ** S = Susceptible; I = Intermediate; R = Resistant **                    P = Positive; N = Negative             MICS are expressed in micrograms per mL    Antibiotic                 RSLT#1    RSLT#2    RSLT#3    RSLT#4 Amoxicillin/Clavulanic Acid  S Ampicillin                     R Cefepime                       S Ceftriaxone                    S Cefuroxime                     S Ciprofloxacin                  S Ertapenem                      S Gentamicin                     S Imipenem                       S Levofloxacin                   S Meropenem                      S Nitrofurantoin                 S Piperacillin/Tazobactam        S Tetracycline                   R Tobramycin                     S Trimethoprim/Sulfa             S   Culture, blood (Routine x 2)     Status: None (Preliminary result)   Collection Time: 05/28/23  4:26 PM   Specimen: BLOOD RIGHT FOREARM  Result Value Ref Range Status   Specimen Description   Final    BLOOD RIGHT FOREARM Performed at [facility], [address] [place], [place] [address]    Special Requests   Final    BOTTLES DRAWN AEROBIC AND ANAEROBIC Blood Culture adequate volume Performed at [facility], [address] [place], [place] [address]    Culture  Setup Time   Final    GRAM NEGATIVE RODS IN BOTH AEROBIC AND ANAEROBIC BOTTLES CRITICAL RESULT CALLED TO, READ BACK BY AND VERIFIED WITH: JESSICA FERRAINOLO [phone], VIRAY,J CRITICAL RESULT CALLED TO, READ BACK BY AND VERIFIED WITH: PHARMD STEVEN H 1220 [id number]  FCP Performed at [facility], [address], [place], [place] [address]    Culture GRAM NEGATIVE RODS  Final   Report Status PENDING  Incomplete  Blood Culture ID Panel (Reflexed)     Status: Abnormal   Collection Time: 05/28/23  4:26 PM  Result Value Ref Range Status   Enterococcus faecalis NOT DETECTED NOT DETECTED Final   Enterococcus Faecium NOT DETECTED NOT DETECTED Final   Listeria monocytogenes NOT DETECTED NOT DETECTED Final   Staphylococcus species NOT DETECTED NOT DETECTED Final   Staphylococcus aureus (BCID) NOT DETECTED NOT DETECTED Final   Staphylococcus epidermidis NOT DETECTED NOT DETECTED Final   Staphylococcus lugdunensis NOT DETECTED NOT DETECTED Final   Streptococcus species NOT DETECTED NOT DETECTED Final   Streptococcus agalactiae NOT DETECTED NOT DETECTED Final   Streptococcus pneumoniae NOT DETECTED NOT DETECTED Final   Streptococcus pyogenes NOT DETECTED  NOT DETECTED Final   A.calcoaceticus-baumannii NOT DETECTED NOT DETECTED Final   Bacteroides fragilis NOT DETECTED NOT DETECTED Final   Enterobacterales DETECTED (A) NOT DETECTED Final    Comment: Enterobacterales represent a large order of gram negative bacteria, not a single organism. CRITICAL RESULT CALLED TO, READ BACK BY AND VERIFIED WITH: PHARMD STEVEN H [id number] [id number] FCP    Enterobacter cloacae complex NOT DETECTED NOT DETECTED Final   Escherichia coli DETECTED (A) NOT DETECTED Final    Comment: CRITICAL RESULT CALLED TO, READ BACK BY AND VERIFIED WITH: PHARMD STEVEN H 1220 [id number] FCP    Klebsiella aerogenes NOT DETECTED NOT DETECTED Final   Klebsiella oxytoca NOT DETECTED NOT DETECTED Final   Klebsiella pneumoniae NOT DETECTED NOT DETECTED Final   Proteus species NOT DETECTED NOT DETECTED Final   Salmonella species NOT DETECTED NOT DETECTED Final   Serratia marcescens NOT DETECTED NOT DETECTED Final   Haemophilus influenzae NOT DETECTED NOT DETECTED Final   Neisseria meningitidis NOT  DETECTED NOT DETECTED Final   Pseudomonas aeruginosa NOT DETECTED NOT DETECTED Final   Stenotrophomonas maltophilia NOT DETECTED NOT DETECTED Final   Candida albicans NOT DETECTED NOT DETECTED Final   Candida auris NOT DETECTED NOT DETECTED Final   Candida glabrata NOT DETECTED NOT DETECTED Final   Candida krusei NOT DETECTED NOT DETECTED Final   Candida parapsilosis NOT DETECTED NOT DETECTED Final   Candida tropicalis NOT DETECTED NOT DETECTED Final   Cryptococcus neoformans/gattii NOT DETECTED NOT DETECTED Final   CTX-M ESBL NOT DETECTED NOT DETECTED Final   Carbapenem resistance IMP NOT DETECTED NOT DETECTED Final   Carbapenem resistance KPC NOT DETECTED NOT DETECTED Final   Carbapenem resistance NDM NOT DETECTED NOT DETECTED Final   Carbapenem resist OXA 48 LIKE NOT DETECTED NOT DETECTED Final   Carbapenem resistance VIM NOT DETECTED NOT DETECTED Final    Comment: Performed at [facility], [address] [place], [place] [address]  Culture, blood (Routine x 2)     Status: None (Preliminary result)   Collection Time: 05/28/23  4:54 PM   Specimen: BLOOD  Result Value Ref Range Status   Specimen Description BLOOD BLOOD RIGHT HAND  Final   Special Requests   Final    BOTTLES DRAWN AEROBIC ONLY Blood Culture adequate volume   Culture   Final    NO GROWTH < 24 HOURS Performed at [facility], [address], [place], [place] [address]    Report Status PENDING  Incomplete  MRSA Next Gen by PCR, Nasal     Status: None   Collection Time: 05/29/23  9:07 AM   Specimen: Nasal Mucosa; Nasal Swab  Result Value Ref Range Status   MRSA by PCR Next Gen NOT DETECTED NOT DETECTED Final    Comment: (NOTE) The GeneXpert MRSA Assay (FDA approved for NASAL specimens only), is one component of a comprehensive MRSA colonization surveillance program. It is not intended to diagnose MRSA infection nor to guide or monitor treatment for MRSA infections. Test performance is not FDA approved in  patients less than 7 years old. Performed at [facility], [address], [place], [place] [address]    Creatinine: Recent Labs    05/28/23 1738 05/29/23 0504  CREATININE 2.19* 2.76*   Baseline Creatinine: NA  Impression/Assessment:  82yo with right ureteral calculus, sepsis from a urinary source  Plan:  -We discussed the management of kidney stones. These options include observation, ureteroscopy, shockwave lithotripsy ([facility]) and percutaneous nephrolithotomy ([facility]). We discussed which  options are relevant to the patient's stone(s). We discussed the natural history of kidney stones as well as the complications of untreated stones and the impact on quality of life without treatment as well as with each of the above listed treatments. We also discussed the efficacy of each treatment in its ability to clear the stone burden. With any of these management options I discussed the signs and symptoms of infection and the need for emergent treatment should these be experienced. For each option we discussed the ability of each procedure to clear the patient of their stone burden.   For observation I described the risks which include but are not limited to silent renal damage, life-threatening infection, need for emergent surgery, failure to pass stone and pain.   For ureteroscopy I described the risks which include bleeding, infection, damage to contiguous structures, positioning injury, ureteral stricture, ureteral avulsion, ureteral injury, need for prolonged ureteral stent, inability to perform ureteroscopy, need for an interval procedure, inability to clear stone burden, stent discomfort/pain, heart attack, stroke, pulmonary embolus and the inherent risks with general anesthesia.   For shockwave lithotripsy I described the risks which include arrhythmia, kidney contusion, kidney hemorrhage, need for transfusion, pain, inability to adequately break up stone, inability to pass stone fragments,  Steinstrasse, infection associated with obstructing stones, need for alternate surgical procedure, need for repeat shockwave lithotripsy, MI, CVA, PE and the inherent risks with anesthesia/conscious sedation.   For PCNL I described the risks including positioning injury, pneumothorax, hydrothorax, need for chest tube, inability to clear stone burden, renal laceration, arterial venous fistula or malformation, need for embolization of kidney, loss of kidney or renal function, need for repeat procedure, need for prolonged nephrostomy tube, ureteral avulsion, MI, CVA, PE and the inherent risks of general anesthesia.   - The patient would like to proceed with right ureteral stent placement  Wilkie Aye 05/29/2023, 1:15 PM

## 2023-05-29 NOTE — Anesthesia Preprocedure Evaluation (Addendum)
Anesthesia Evaluation  Patient identified by MRN, date of birth, ID band Patient awake    Reviewed: Allergy & Precautions, NPO status , Patient's Chart, lab work & pertinent test results, reviewed documented beta blocker date and time   History of Anesthesia Complications Negative for: history of anesthetic complications  Airway Mallampati: II  TM Distance: <3 FB Neck ROM: Full    Dental  (+) Caps, Dental Advisory Given,    Pulmonary shortness of breath and at rest   Pulmonary exam normal breath sounds clear to auscultation       Cardiovascular Exercise Tolerance: Poor hypertension, Pt. on home beta blockers and Pt. on medications + CAD  Normal cardiovascular exam+ dysrhythmias Supra Ventricular Tachycardia + Valvular Problems/Murmurs  Rhythm:Regular Rate:Normal  23-Dec-2020 11:58:44 [facility] ROUTINE RECORD [birth year] (79 yr) Male Caucasian Vent. rate 79 BPM PR interval 217 ms QRS duration 136 ms QT/QTcB 429/492 ms P-R-T axes 16 -27 19 Sinus rhythm Borderline prolonged PR interval Right bundle branch block Confirmed by Alona Bene [id number]) on 12/23/2020 12:41:51 PM  1. Left ventricular ejection fraction, by estimation, is 60 to 65%. The left ventricle has normal function. The left ventricle has no regional wall motion abnormalities. There is mild left ventricular hypertrophy. Left ventricular diastolic parameters are consistent with Grade I diastolic dysfunction (impaired relaxation).  2. Right ventricular systolic function is normal. The right ventricular size is normal. There is normal pulmonary artery systolic pressure.  3. The mitral valve is normal in structure. No evidence of mitral valve regurgitation. No evidence of mitral stenosis.  4. The aortic valve is tricuspid. Aortic valve regurgitation is not  visualized. Mild aortic valve sclerosis is present, with no evidence of aortic valve  stenosis.  5. Aortic dilatation noted. There is mild dilatation of the aortic root, measuring 39 mm.  6. The inferior vena cava is normal in size with greater than 50% respiratory variability, suggesting right atrial pressure of 3 mmHg.     Neuro/Psych Subdural hematoma  Neuromuscular disease  negative psych ROS   GI/Hepatic negative GI ROS,,,(+) Cirrhosis  (NAFLD)        Endo/Other  diabetes, Well Controlled, Type 2, Insulin Dependent    Renal/GU ESRF and DialysisRenal disease   Prostate cancer    Musculoskeletal  (+) Arthritis , Osteoarthritis,    Abdominal Normal abdominal exam  (+)   Peds  Hematology negative hematology ROS (+)   Anesthesia Other Findings Shingles, H/o nose bleeds Left leg ulcers  SEPTIC!  Reproductive/Obstetrics negative OB ROS                             Anesthesia Physical Anesthesia Plan  ASA: 4  Anesthesia Plan: General   Post-op Pain Management:    Induction: Intravenous  PONV Risk Score and Plan: Ondansetron, Dexamethasone and Midazolam  Airway Management Planned: LMA  Additional Equipment: None  Intra-op Plan:   Post-operative Plan:   Informed Consent: I have reviewed the patients History and Physical, chart, labs and discussed the procedure including the risks, benefits and alternatives for the proposed anesthesia with the patient or authorized representative who has indicated his/her understanding and acceptance.     Dental advisory given  Plan Discussed with: CRNA  Anesthesia Plan Comments:         Anesthesia Quick Evaluation

## 2023-05-30 DIAGNOSIS — N201 Calculus of ureter: Secondary | ICD-10-CM | POA: Diagnosis not present

## 2023-05-30 DIAGNOSIS — E1142 Type 2 diabetes mellitus with diabetic polyneuropathy: Secondary | ICD-10-CM | POA: Diagnosis not present

## 2023-05-30 DIAGNOSIS — A419 Sepsis, unspecified organism: Secondary | ICD-10-CM | POA: Diagnosis not present

## 2023-05-30 DIAGNOSIS — N186 End stage renal disease: Secondary | ICD-10-CM | POA: Diagnosis not present

## 2023-05-30 LAB — CBC
HCT: 26.6 % — ABNORMAL LOW (ref 39.0–52.0)
Hemoglobin: 8.4 g/dL — ABNORMAL LOW (ref 13.0–17.0)
MCH: 33.5 pg (ref 26.0–34.0)
MCHC: 31.6 g/dL (ref 30.0–36.0)
MCV: 106 fL — ABNORMAL HIGH (ref 80.0–100.0)
Platelets: 111 10*3/uL — ABNORMAL LOW (ref 150–400)
RBC: 2.51 MIL/uL — ABNORMAL LOW (ref 4.22–5.81)
RDW: 15.4 % (ref 11.5–15.5)
WBC: 19.7 10*3/uL — ABNORMAL HIGH (ref 4.0–10.5)
nRBC: 0 % (ref 0.0–0.2)

## 2023-05-30 LAB — RENAL FUNCTION PANEL
Albumin: 1.7 g/dL — ABNORMAL LOW (ref 3.5–5.0)
Anion gap: 13 (ref 5–15)
BUN: 34 mg/dL — ABNORMAL HIGH (ref 8–23)
CO2: 21 mmol/L — ABNORMAL LOW (ref 22–32)
Calcium: 7.2 mg/dL — ABNORMAL LOW (ref 8.9–10.3)
Chloride: 98 mmol/L (ref 98–111)
Creatinine, Ser: 3.08 mg/dL — ABNORMAL HIGH (ref 0.61–1.24)
GFR, Estimated: 19 mL/min — ABNORMAL LOW (ref >60)
Glucose, Bld: 137 mg/dL — ABNORMAL HIGH (ref 70–99)
Phosphorus: 3.7 mg/dL (ref 2.5–4.6)
Potassium: 3.8 mmol/L (ref 3.5–5.1)
Sodium: 132 mmol/L — ABNORMAL LOW (ref 135–145)

## 2023-05-30 LAB — GLUCOSE, CAPILLARY
Glucose-Capillary: 68 mg/dL — ABNORMAL LOW (ref 70–99)
Glucose-Capillary: 70 mg/dL (ref 70–99)
Glucose-Capillary: 124 mg/dL — ABNORMAL HIGH (ref 70–99)
Glucose-Capillary: 167 mg/dL — ABNORMAL HIGH (ref 70–99)
Glucose-Capillary: 326 mg/dL — ABNORMAL HIGH (ref 70–99)

## 2023-05-30 LAB — HEPATITIS B SURFACE ANTIGEN

## 2023-05-30 MED ORDER — SODIUM CHLORIDE 0.9% FLUSH
10.0000 mL | Freq: Two times a day (BID) | INTRAVENOUS | Status: DC
  Administered 2023-05-30 – 2023-06-01 (×4): 10 mL

## 2023-05-30 MED ORDER — SODIUM CHLORIDE 0.9 % IV BOLUS
500.0000 mL | Freq: Once | INTRAVENOUS | Status: AC
  Administered 2023-05-30: 500 mL via INTRAVENOUS

## 2023-05-30 MED ORDER — PENTAFLUOROPROP-TETRAFLUOROETH EX AERO: 1.0000 | INHALATION_SPRAY | CUTANEOUS | Status: DC | PRN

## 2023-05-30 MED ORDER — GLUCOSE 40 % PO GEL
1.0000 | ORAL | Status: AC
  Filled 2023-05-30: qty 1.21

## 2023-05-30 MED ORDER — PROSOURCE PLUS PO LIQD
30.0000 mL | Freq: Two times a day (BID) | ORAL | Status: DC
  Administered 2023-05-31 – 2023-06-03 (×8): 30 mL via ORAL
  Filled 2023-05-30 (×6): qty 30

## 2023-05-30 MED ORDER — SODIUM CHLORIDE 0.9% FLUSH: 10.0000 mL | INTRAVENOUS | Status: DC | PRN

## 2023-05-30 NOTE — Care Management Important Message (Signed)
Important Message  Patient Details  Name: [name] Sr. MRN: [id number] Date of Birth: 05-May-1941   Important Message Given:  Yes - Medicare IM     Corey Harold 05/30/2023, 1:37 PM

## 2023-05-30 NOTE — Consult Note (Signed)
Patient:  Mark FIERSTEIN Sr.  DOB:  September 18, 1940  MRN:  [id number]   Preop Diagnosis: End-stage renal disease, need for central venous access  Postop Diagnosis: Same  Procedure: Central line placement  Surgeon: Franky Macho, MD  Anes: Local  Indications: Patient is an 82 year old white male with end-stage renal disease who has lost IV access and is in need of IV access.  The risks and benefits of the procedure including bleeding and infection were fully explained to the patient, who gave informed consent.  Procedure note: The procedure was done at bedside in ICU 2.  A timeout was performed.  The right groin was prepped and draped using usual sterile technique with ChloraPrep.  1% Xylocaine was used for local anesthesia.  The right femoral vein was accessed using in the Seldinger technique without difficulty.  The guidewire was then advanced.  An introducer was placed over the guidewire.  The guidewire was removed after a triple-lumen central line was placed.  Good backflow of venous blood was noted on aspiration of all 3 ports.  All 3 ports were flushed with saline.  The line was secured in place using 3-0 silk sutures.  A dry sterile dressing was applied.  He tolerated the procedure well.  Complications: None  EBL: Minimal  Specimen: None

## 2023-05-30 NOTE — Consult Note (Signed)
Reason for Consult: To manage dialysis and dialysis related needs Referring Physician: Dr Lawrence Marseilles Sr. is an 82 y.o. male.  HPI: Pt is an 25 M with ESRD on HD, HTN, HLD, DM II, nephrolithiasis who is now seen in consultation at the request of Dr Gwenlyn Perking for provision of dialysis and management of ESRD.  Finished full session of dialysis Wednesday.  Had been reporting hematuria and pneumaturia, was referred to urology but at the end of the session was hypotensive and altered.  Came here to [facility] via EMS.  Noted to be septic with R infected stone. Empiric antibiotics and cultures started. Had cysto with placement of double J stent.  Tolerated well.    Blood cultures + for E coli and urine cultures too.  On ceftriaxone now.    Had HD today on regular schedule.  Feeling much better and is AAO x 3. On midodrine 10 TID  [facility] MWF  Past Medical History:  Diagnosis Date   Arthritis    lower back   Chronic back pain    Disc disease   Chronic kidney disease    Coronary atherosclerosis of native coronary artery    Coronary calcifications by chest CT, Myoview demonstrating inferolateral scar   Deafness in right ear    Diabetes mellitus    Diabetic retinopathy (HCC)    moderate nonproliferative with macular edema in right eye   Diastolic dysfunction 12/2011   Grade 1.   Essential hypertension, benign    Heart murmur    in past   HOH (hard of hearing)    Hyperkalemia    Kidney stones    Mixed hyperlipidemia    NAFLD (nonalcoholic fatty liver disease)    Pancreatic insufficiency    Diagnosed at [facility]   Prostate cancer [facility]) 2011   Shortness of breath dyspnea    occasional - liver pressing on right lung, decreased capacity   Subdural hematoma (HCC)    Type 2 diabetes mellitus (HCC)    Wears hearing aid    left    Past Surgical History:  Procedure Laterality Date   APPENDECTOMY     AV FISTULA PLACEMENT Left 05/15/2021   Procedure: INSERTION OF LEFT ARM ARTERIOVENOUS  (AV) GORE-TEX GRAFT;  Surgeon: Larina Earthly, MD;  Location: [facility];  Service: Vascular;  Laterality: Left;   Bilateral hip replacement     CATARACT EXTRACTION W/PHACO Left 05/15/2015   Procedure: CATARACT EXTRACTION PHACO AND INTRAOCULAR LENS PLACEMENT (IOC);  Surgeon: Sherald Hess, MD;  Location: [facility];  Service: Ophthalmology;  Laterality: Left;  DIABETIC - insulin pump and oral meds   CATARACT EXTRACTION W/PHACO Right 08/28/2015   Procedure: CATARACT EXTRACTION PHACO AND INTRAOCULAR LENS PLACEMENT (IOC);  Surgeon: Sherald Hess, MD;  Location: [facility];  Service: Ophthalmology;  Laterality: Right;  DIABETIC PER PT AND CINDY PLEASE KEEP ARRIVAL TIME AFTER 8AM    CHOLECYSTECTOMY     HERNIA REPAIR     INSERTION OF DIALYSIS CATHETER Right 01/22/2021   Procedure: INSERTION OF DIALYSIS CATHETER;  Surgeon: Lucretia Roers, MD;  Location: [facility];  Service: General;  Laterality: Right;   INSERTION OF DIALYSIS CATHETER Right 04/16/2021   Procedure: INSERTION OF DIALYSIS CATHETER;  Surgeon: Lucretia Roers, MD;  Location: [facility];  Service: General;  Laterality: Right;   PROSTATECTOMY  2011    Family History  Problem Relation Age of Onset   Diabetes type II Mother    Heart  attack Father     Social History:  reports that he has never smoked. He has never used smokeless tobacco. He reports that he does not drink alcohol and does not use drugs.  Allergies:  Allergies  Allergen Reactions   Enalapril Maleate Cough    Medications: Scheduled:  Chlorhexidine Gluconate Cloth  6 each Topical Daily   Chlorhexidine Gluconate Cloth  6 each Topical Q0600   dextrose  1 Tube Oral STAT   diazepam  1 mg Oral QHS   diltiazem  240 mg Oral Daily   heparin  5,000 Units Subcutaneous Q8H   insulin aspart  0-5 Units Subcutaneous QHS   insulin aspart  0-6 Units Subcutaneous TID WC   midodrine  10 mg Oral TID WC   pravastatin  10 mg Oral q1800     Results  for orders placed or performed during the hospital encounter of 05/28/23 (from the past 48 hour(s))  CBG monitoring, ED     Status: Abnormal   Collection Time: 05/28/23  4:12 PM  Result Value Ref Range   Glucose-Capillary 53 (L) 70 - 99 mg/dL    Comment: Glucose reference range applies only to samples taken after fasting for at least 8 hours.  Lactic acid, plasma     Status: Abnormal   Collection Time: 05/28/23  4:26 PM  Result Value Ref Range   Lactic Acid, Venous 7.2 (HH) 0.5 - 1.9 mmol/L    Comment: CRITICAL RESULT CALLED TO, READ BACK BY AND VERIFIED WITH BAYNE,B ON 05/28/23 AT 1746 BY LOY,C Performed at [facility], [address] [place], [place] [address]   Culture, blood (Routine x 2)     Status: Abnormal (Preliminary result)   Collection Time: 05/28/23  4:26 PM   Specimen: BLOOD RIGHT FOREARM  Result Value Ref Range   Specimen Description      BLOOD RIGHT FOREARM Performed at [facility], [address], [place], [place] [address]    Special Requests      BOTTLES DRAWN AEROBIC AND ANAEROBIC Blood Culture adequate volume Performed at [facility], [address], [place], [place] [address]    Culture  Setup Time      GRAM NEGATIVE RODS IN BOTH AEROBIC AND ANAEROBIC BOTTLES CRITICAL RESULT CALLED TO, READ BACK BY AND VERIFIED WITH: JESSICA FERRAINOLO [id number], VIRAY,J CRITICAL RESULT CALLED TO, READ BACK BY AND VERIFIED WITH: PHARMD STEVEN H [id number] FCP    Culture (A)     ESCHERICHIA COLI SUSCEPTIBILITIES TO FOLLOW Performed at [facility], [address], [place], [place] [address]    Report Status PENDING   CBC with Differential/Platelet     Status: Abnormal   Collection Time: 05/28/23  4:26 PM  Result Value Ref Range   WBC 29.4 (H) 4.0 - 10.5 K/uL    Comment: WHITE COUNT CONFIRMED ON SMEAR   RBC 3.62 (L) 4.22 - 5.81 MIL/uL   Hemoglobin 12.1 (L) 13.0 - 17.0 g/dL   HCT 42.5 (L) 95.6 - 38.7 %   MCV 105.5 (H) 80.0 - 100.0 fL   MCH 33.4 26.0 - 34.0 pg    MCHC 31.7 30.0 - 36.0 g/dL   RDW 56.4 33.2 - 95.1 %   Platelets 118 (L) 150 - 400 K/uL    Comment: SPECIMEN CHECKED FOR CLOTS REPEATED TO VERIFY    nRBC 0.0 0.0 - 0.2 %   Neutrophils Relative % 97 %   Neutro Abs 28.5 (H) 1.7 - 7.7 K/uL  Lymphocytes Relative 1 %   Lymphs Abs 0.3 (L) 0.7 - 4.0 K/uL   Monocytes Relative 1 %   Monocytes Absolute 0.3 0.1 - 1.0 K/uL   Eosinophils Relative 0 %   Eosinophils Absolute 0.0 0.0 - 0.5 K/uL   Basophils Relative 0 %   Basophils Absolute 0.1 0.0 - 0.1 K/uL   RBC Morphology MORPHOLOGY UNREMARKABLE    Smear Review PLATELET COUNT CONFIRMED BY SMEAR    Immature Granulocytes 1 %   Abs Immature Granulocytes 0.22 (H) 0.00 - 0.07 K/uL    Comment: Performed at [facility], [address] [place] [address] [place], [place] [address]  Hemoglobin A1c     Status: Abnormal   Collection Time: 05/28/23  4:26 PM  Result Value Ref Range   Hgb A1c MFr Bld 6.5 (H) 4.8 - 5.6 %    Comment: (NOTE) Pre diabetes:          5.7%-6.4%  Diabetes:              >6.4%  Glycemic control for   <7.0% adults with diabetes    Mean Plasma Glucose 139.85 mg/dL    Comment: Performed at [facility], [address] [place], [place] [address]  Blood Culture ID Panel (Reflexed)     Status: Abnormal   Collection Time: 05/28/23  4:26 PM  Result Value Ref Range   Enterococcus faecalis NOT DETECTED NOT DETECTED   Enterococcus Faecium NOT DETECTED NOT DETECTED   Listeria monocytogenes NOT DETECTED NOT DETECTED   Staphylococcus species NOT DETECTED NOT DETECTED   Staphylococcus aureus (BCID) NOT DETECTED NOT DETECTED   Staphylococcus epidermidis NOT DETECTED NOT DETECTED   Staphylococcus lugdunensis NOT DETECTED NOT DETECTED   Streptococcus species NOT DETECTED NOT DETECTED   Streptococcus agalactiae NOT DETECTED NOT DETECTED   Streptococcus pneumoniae NOT DETECTED NOT DETECTED   Streptococcus pyogenes NOT DETECTED NOT DETECTED   A.calcoaceticus-baumannii NOT DETECTED NOT DETECTED    Bacteroides fragilis NOT DETECTED NOT DETECTED   Enterobacterales DETECTED (A) NOT DETECTED    Comment: Enterobacterales represent a large order of gram negative bacteria, not a single organism. CRITICAL RESULT CALLED TO, READ BACK BY AND VERIFIED WITH: PHARMD STEVEN H [id number] FCP    Enterobacter cloacae complex NOT DETECTED NOT DETECTED   Escherichia coli DETECTED (A) NOT DETECTED    Comment: CRITICAL RESULT CALLED TO, READ BACK BY AND VERIFIED WITH: PHARMD STEVEN H [id number] FCP    Klebsiella aerogenes NOT DETECTED NOT DETECTED   Klebsiella oxytoca NOT DETECTED NOT DETECTED   Klebsiella pneumoniae NOT DETECTED NOT DETECTED   Proteus species NOT DETECTED NOT DETECTED   Salmonella species NOT DETECTED NOT DETECTED   Serratia marcescens NOT DETECTED NOT DETECTED   Haemophilus influenzae NOT DETECTED NOT DETECTED   Neisseria meningitidis NOT DETECTED NOT DETECTED   Pseudomonas aeruginosa NOT DETECTED NOT DETECTED   Stenotrophomonas maltophilia NOT DETECTED NOT DETECTED   Candida albicans NOT DETECTED NOT DETECTED   Candida auris NOT DETECTED NOT DETECTED   Candida glabrata NOT DETECTED NOT DETECTED   Candida krusei NOT DETECTED NOT DETECTED   Candida parapsilosis NOT DETECTED NOT DETECTED   Candida tropicalis NOT DETECTED NOT DETECTED   Cryptococcus neoformans/gattii NOT DETECTED NOT DETECTED   CTX-M ESBL NOT DETECTED NOT DETECTED   Carbapenem resistance IMP NOT DETECTED NOT DETECTED   Carbapenem resistance KPC NOT DETECTED NOT DETECTED   Carbapenem resistance NDM NOT DETECTED NOT DETECTED   Carbapenem resist OXA 48 LIKE NOT DETECTED NOT  DETECTED   Carbapenem resistance VIM NOT DETECTED NOT DETECTED    Comment: Performed at [facility], [address] [place], [place] [address]  CBG monitoring, ED     Status: Abnormal   Collection Time: 05/28/23  4:44 PM  Result Value Ref Range   Glucose-Capillary 164 (H) 70 - 99 mg/dL    Comment: Glucose reference range applies  only to samples taken after fasting for at least 8 hours.  Protime-INR     Status: Abnormal   Collection Time: 05/28/23  4:54 PM  Result Value Ref Range   Prothrombin Time 16.1 (H) 11.4 - 15.2 seconds   INR 1.3 (H) 0.8 - 1.2    Comment: (NOTE) INR goal varies based on device and disease states. Performed at [facility], [address], [place], [place] [address]   Culture, blood (Routine x 2)     Status: None (Preliminary result)   Collection Time: 05/28/23  4:54 PM   Specimen: BLOOD  Result Value Ref Range   Specimen Description BLOOD BLOOD RIGHT HAND    Special Requests      BOTTLES DRAWN AEROBIC ONLY Blood Culture adequate volume   Culture      NO GROWTH 2 DAYS Performed at [facility], [address] [place], [place] [address]    Report Status PENDING   Comprehensive metabolic panel     Status: Abnormal   Collection Time: 05/28/23  5:38 PM  Result Value Ref Range   Sodium 134 (L) 135 - 145 mmol/L   Potassium 3.9 3.5 - 5.1 mmol/L   Chloride 94 (L) 98 - 111 mmol/L   CO2 24 22 - 32 mmol/L   Glucose, Bld 161 (H) 70 - 99 mg/dL    Comment: Glucose reference range applies only to samples taken after fasting for at least 8 hours.   BUN 14 8 - 23 mg/dL   Creatinine, Ser 9.14 (H) 0.61 - 1.24 mg/dL   Calcium 8.3 (L) 8.9 - 10.3 mg/dL   Total Protein 5.4 (L) 6.5 - 8.1 g/dL   Albumin 2.1 (L) 3.5 - 5.0 g/dL   AST 35 15 - 41 U/L   ALT 39 0 - 44 U/L   Alkaline Phosphatase 356 (H) 38 - 126 U/L   Total Bilirubin 1.2 0.3 - 1.2 mg/dL   GFR, Estimated 29 (L) >60 mL/min    Comment: (NOTE) Calculated using the CKD-EPI Creatinine Equation (2021)    Anion gap 16 (H) 5 - 15    Comment: Performed at [facility], [address], [place], [place] [address]  Lactic acid, plasma     Status: Abnormal   Collection Time: 05/28/23  6:24 PM  Result Value Ref Range   Lactic Acid, Venous 5.0 (HH) 0.5 - 1.9 mmol/L    Comment: CRITICAL VALUE NOTED.  VALUE IS CONSISTENT WITH PREVIOUSLY REPORTED AND  CALLED VALUE. Performed at [facility], [address], [place], [place] [address]   POC CBG, ED     Status: Abnormal   Collection Time: 05/28/23  6:25 PM  Result Value Ref Range   Glucose-Capillary 182 (H) 70 - 99 mg/dL    Comment: Glucose reference range applies only to samples taken after fasting for at least 8 hours.  POC CBG, ED     Status: Abnormal   Collection Time: 05/28/23  7:33 PM  Result Value Ref Range   Glucose-Capillary 176 (H) 70 - 99 mg/dL    Comment: Glucose reference range applies only to  samples taken after fasting for at least 8 hours.  Urinalysis, w/ Reflex to Culture (Infection Suspected) -Urine, Clean Catch     Status: Abnormal   Collection Time: 05/28/23  7:45 PM  Result Value Ref Range   Specimen Source URINE, CATHETERIZED    Color, Urine YELLOW YELLOW   APPearance TURBID (A) CLEAR   Specific Gravity, Urine 1.011 1.005 - 1.030   pH 7.0 5.0 - 8.0   Glucose, UA NEGATIVE NEGATIVE mg/dL   Hgb urine dipstick MODERATE (A) NEGATIVE   Bilirubin Urine NEGATIVE NEGATIVE   Ketones, ur NEGATIVE NEGATIVE mg/dL   Protein, ur 161 (A) NEGATIVE mg/dL   Nitrite NEGATIVE NEGATIVE   Leukocytes,Ua MODERATE (A) NEGATIVE   RBC / HPF 21-50 0 - 5 RBC/hpf   WBC, UA >50 0 - 5 WBC/hpf    Comment:        Reflex urine culture not performed if WBC <=10, OR if Squamous epithelial cells >5. If Squamous epithelial cells >5 suggest recollection.    Bacteria, UA MANY (A) NONE SEEN   Squamous Epithelial / HPF 0-5 0 - 5 /HPF   WBC Clumps PRESENT     Comment: Performed at [facility], [address] [place], [place] [address]  Urine Culture     Status: Abnormal (Preliminary result)   Collection Time: 05/28/23  7:45 PM   Specimen: Urine, Random  Result Value Ref Range   Specimen Description      URINE, RANDOM Performed at [facility], [address] [place], [place] [address]    Special Requests      NONE Reflexed from [id number] Performed at [facility], [address],  [place], [place] [address]    Culture (A)     >=100,000 COLONIES/mL ESCHERICHIA COLI SUSCEPTIBILITIES TO FOLLOW Performed at [facility], [address] [place], [place] [address]    Report Status PENDING   Lactic acid, plasma     Status: Abnormal   Collection Time: 05/29/23  1:12 AM  Result Value Ref Range   Lactic Acid, Venous 2.3 (HH) 0.5 - 1.9 mmol/L    Comment: CRITICAL RESULT CALLED TO, READ BACK BY AND VERIFIED WITH Melynda Keller [phone], VIRAY,J Performed at [facility], [address], [place], [place] [address]   Lactic acid, plasma     Status: None   Collection Time: 05/29/23  5:04 AM  Result Value Ref Range   Lactic Acid, Venous 1.7 0.5 - 1.9 mmol/L    Comment: Performed at [facility], [address], [place], [place] [address]  Renal function panel     Status: Abnormal   Collection Time: 05/29/23  5:04 AM  Result Value Ref Range   Sodium 132 (L) 135 - 145 mmol/L   Potassium 3.6 3.5 - 5.1 mmol/L   Chloride 97 (L) 98 - 111 mmol/L   CO2 25 22 - 32 mmol/L   Glucose, Bld 207 (H) 70 - 99 mg/dL    Comment: Glucose reference range applies only to samples taken after fasting for at least 8 hours.   BUN 23 8 - 23 mg/dL   Creatinine, Ser 1.02 (H) 0.61 - 1.24 mg/dL   Calcium 7.7 (L) 8.9 - 10.3 mg/dL   Phosphorus 4.5 2.5 - 4.6 mg/dL   Albumin 1.8 (L) 3.5 - 5.0 g/dL   GFR, Estimated 22 (L) >60 mL/min    Comment: (NOTE) Calculated using the CKD-EPI Creatinine Equation (2021)    Anion gap 10 5 - 15  Comment: Performed at [facility], [address] [place], [place] [address]  Glucose, capillary     Status: Abnormal   Collection Time: 05/29/23  8:45 AM  Result Value Ref Range   Glucose-Capillary 158 (H) 70 - 99 mg/dL    Comment: Glucose reference range applies only to samples taken after fasting for at least 8 hours.  Glucose, capillary     Status: Abnormal   Collection Time: 05/29/23  8:56 AM  Result Value Ref Range   Glucose-Capillary 144 (H) 70 - 99 mg/dL     Comment: Glucose reference range applies only to samples taken after fasting for at least 8 hours.  MRSA Next Gen by PCR, Nasal     Status: None   Collection Time: 05/29/23  9:07 AM   Specimen: Nasal Mucosa; Nasal Swab  Result Value Ref Range   MRSA by PCR Next Gen NOT DETECTED NOT DETECTED    Comment: (NOTE) The GeneXpert MRSA Assay (FDA approved for NASAL specimens only), is one component of a comprehensive MRSA colonization surveillance program. It is not intended to diagnose MRSA infection nor to guide or monitor treatment for MRSA infections. Test performance is not FDA approved in patients less than 85 years old. Performed at [facility], [address], [place], [place] [address]   Glucose, capillary     Status: Abnormal   Collection Time: 05/29/23 11:05 AM  Result Value Ref Range   Glucose-Capillary 138 (H) 70 - 99 mg/dL    Comment: Glucose reference range applies only to samples taken after fasting for at least 8 hours.  Glucose, capillary     Status: Abnormal   Collection Time: 05/29/23  4:12 PM  Result Value Ref Range   Glucose-Capillary 103 (H) 70 - 99 mg/dL    Comment: Glucose reference range applies only to samples taken after fasting for at least 8 hours.  Glucose, capillary     Status: Abnormal   Collection Time: 05/29/23  9:04 PM  Result Value Ref Range   Glucose-Capillary 107 (H) 70 - 99 mg/dL    Comment: Glucose reference range applies only to samples taken after fasting for at least 8 hours.  Glucose, capillary     Status: Abnormal   Collection Time: 05/30/23  7:21 AM  Result Value Ref Range   Glucose-Capillary 68 (L) 70 - 99 mg/dL    Comment: Glucose reference range applies only to samples taken after fasting for at least 8 hours.  Glucose, capillary     Status: None   Collection Time: 05/30/23  7:48 AM  Result Value Ref Range   Glucose-Capillary 70 70 - 99 mg/dL    Comment: Glucose reference range applies only to samples taken after fasting for at least 8  hours.  CBC     Status: Abnormal   Collection Time: 05/30/23  8:59 AM  Result Value Ref Range   WBC 19.7 (H) 4.0 - 10.5 K/uL   RBC 2.51 (L) 4.22 - 5.81 MIL/uL   Hemoglobin 8.4 (L) 13.0 - 17.0 g/dL    Comment: REPEATED TO VERIFY DELTA CHECK NOTED    HCT 26.6 (L) 39.0 - 52.0 %   MCV 106.0 (H) 80.0 - 100.0 fL   MCH 33.5 26.0 - 34.0 pg   MCHC 31.6 30.0 - 36.0 g/dL   RDW 41.6 60.6 - 30.1 %   Platelets 111 (L) 150 - 400 K/uL   nRBC 0.0 0.0 - 0.2 %    Comment: Performed at [facility],  [address], [place], [place] [address]  Renal function panel     Status: Abnormal   Collection Time: 05/30/23  8:59 AM  Result Value Ref Range   Sodium 132 (L) 135 - 145 mmol/L   Potassium 3.8 3.5 - 5.1 mmol/L   Chloride 98 98 - 111 mmol/L   CO2 21 (L) 22 - 32 mmol/L   Glucose, Bld 137 (H) 70 - 99 mg/dL    Comment: Glucose reference range applies only to samples taken after fasting for at least 8 hours.   BUN 34 (H) 8 - 23 mg/dL   Creatinine, Ser 6.04 (H) 0.61 - 1.24 mg/dL   Calcium 7.2 (L) 8.9 - 10.3 mg/dL   Phosphorus 3.7 2.5 - 4.6 mg/dL   Albumin 1.7 (L) 3.5 - 5.0 g/dL   GFR, Estimated 19 (L) >60 mL/min    Comment: (NOTE) Calculated using the CKD-EPI Creatinine Equation (2021)    Anion gap 13 5 - 15    Comment: Performed at [facility], [address], [place], [place] [address]  Glucose, capillary     Status: Abnormal   Collection Time: 05/30/23 11:11 AM  Result Value Ref Range   Glucose-Capillary 124 (H) 70 - 99 mg/dL    Comment: Glucose reference range applies only to samples taken after fasting for at least 8 hours.    DG C-Arm 1-60 Min-No Report  Result Date: 05/29/2023 Fluoroscopy was utilized by the requesting physician.  No radiographic interpretation.   CT Renal Stone Study  Result Date: 05/28/2023 CLINICAL DATA:  Flank pain. EXAM: CT ABDOMEN AND PELVIS WITHOUT CONTRAST TECHNIQUE: Multidetector CT imaging of the abdomen and pelvis was performed following the standard  protocol without IV contrast. RADIATION DOSE REDUCTION: This exam was performed according to the departmental dose-optimization program which includes automated exposure control, adjustment of the mA and/or kV according to patient size and/or use of iterative reconstruction technique. COMPARISON:  March 20, 2013. FINDINGS: Lower chest: Minimal bibasilar subsegmental atelectasis is noted. Hepatobiliary: Status post cholecystectomy. Mild intrahepatic and extrahepatic biliary dilatation is noted most likely due to post cholecystectomy status. No definite acute hepatic abnormality seen on these unenhanced images. Pancreas: Diffuse pancreatic atrophy is noted. No acute abnormality seen. Spleen: Normal in size without focal abnormality. Adrenals/Urinary Tract: Adrenal glands are unremarkable. Left renal atrophy is noted. Urinary bladder is not well visualized due to scatter artifact arising from bilateral hip arthroplasties. Moderate right hydroureteronephrosis is noted which contains both gas and fluid. There appears to be multiple distal right ureteral calculi with the largest measuring 4 mm. Stomach/Bowel: Mild gastric distention is noted. Stool is noted throughout the colon. There is no definite evidence of bowel obstruction or inflammation. The appendix is not visualized. Vascular/Lymphatic: Aortic atherosclerosis. No enlarged abdominal or pelvic lymph nodes. Reproductive: Prostate is not well visualized due to scatter artifact arising from bilateral hip arthroplasties. Other: No ascites or hernia is noted. Musculoskeletal: No acute or significant osseous findings. IMPRESSION: Moderate right hydroureteronephrosis is noted which contains both gas and fluid in the intrarenal collecting system and ureter. This may represent ascending urinary tract infection. Multiple distal right ureteral calculi are noted which may be obstructive, the largest measuring 4 mm. Mild gastric distention is noted. Stool is noted throughout  the colon. Prostate gland and urinary bladder are not well visualized due to scatter artifact arising from bilateral hip arthroplasties. Aortic Atherosclerosis (ICD10-I70.0). Electronically Signed   By: Lupita Raider M.D.   On: 05/28/2023 20:28   DG Chest  Port 1 View  Result Date: 05/28/2023 CLINICAL DATA:  [id number] Sepsis [facility]) [id number] EXAM: PORTABLE CHEST 1 VIEW COMPARISON:  Chest x-ray 04/16/2021, CT chest 12/02/2018 FINDINGS: The heart and mediastinal contours are unchanged. Aortic calcification. Low lung volumes. No focal consolidation. No pulmonary edema. No pleural effusion. No pneumothorax. No acute osseous abnormality. IMPRESSION: 1. Low lung volumes with no active disease. 2.  Aortic Atherosclerosis (ICD10-I70.0). Electronically Signed   By: Tish Frederickson M.D.   On: 05/28/2023 19:44    ROS: all other systems reviewed and are negative except as per hPI  Blood pressure (!) 120/52, pulse (!) 106, temperature 97.6 F (36.4 C), temperature source Oral, resp. rate 17, height 5\' 8"  (1.727 m), weight 66.7 kg, SpO2 100%. . GEN older gentleman, NAD HEENT EOMI PERRL NECK no JVD PULM clear CV tachy, irregular ABD soft GU: Foley with + rosy urine EXT no LE edema NEURO Aao x 3 SKIN thinned, some skin tears and burising ACCESS: l avf + t/B  Assessment/Plan: 1 Sepsis 2/2 urolithiasis: s/p JJ stent and stone extraction yesterday.  Blood and urine cultures + for E coli.  On ceftriaxone.  Per primary and urology 2 ESRD: [facility] MWF- HD on schedule 3 Hypotension: on midodrine 10 TID 4. Anemia of ESRD: Hgb 8.4, will give ESA today 5. Metabolic Bone Disease: phos OK 6.  Nutrition: albumin very low- will do prosource  [name] 05/30/2023, 2:36 PM

## 2023-05-30 NOTE — Progress Notes (Signed)
OT Cancellation Note  Patient Details Name: TREVAN SQUILLANTE Sr. MRN: [id number] DOB: [birth year]   Cancelled Treatment:    Reason Eval/Treat Not Completed: Patient at procedure or test/ unavailable. Pt have a procedure done in the room and was not available for therapy. Will attempt evaluation later as time permits.   Adalay Azucena OT, MOT   Danie Chandler 05/30/2023, 9:06 AM

## 2023-05-30 NOTE — Procedures (Signed)
HD Note:  Some information was entered later than the data was gathered due to patient care needs. The stated time with the data is accurate.  Received patient in bed to unit.   Alert and oriented.   Informed consent signed and in chart.   Access used: Upper left arm graft Access issues: Unsuccessful stick in the top part of the graft.  There was evidence of past stick there.  Midline successful.  No further issues.  Patient tolerated treatment well.   TX duration: 3.5 hours  Alert, without acute distress.  Total UF removed: 2500 ml  Hand-off given to patient's nurse.   Transported back to the room   Rayn Enderson L. Dareen Piano, RN [facility].

## 2023-05-30 NOTE — Progress Notes (Signed)
Progress Note   Patient: [name] JXB:[id number] DOB: 08/08/1940 DOA: 05/28/2023     2 DOS: the patient was seen and examined on 05/30/2023   Brief hospital admission course:   As per H&P written by Dr. Randol Kern on 05/28/2023 Tiree Krumwiede  is a 82 y.o. male, ith a history of type 2 diabetes on insulin, hypertension, ESRD requiring hemodialysis  -Patient was sent by his HD center for hypoglycemia, patient did receive a full treatment yesterday, he had altered mental status, shaking, CBG at HD center where 58, he was given oral glucose, recheck was at 61, received another dose of oral glucose and brought to ED for further evaluation, CBG in ED was at 84, he received D50, and it remained persistently> 100 after that, patient reports generalized weakness, fatigue, low-grade temperature at home with chills, he has been followed by Dr. Ronne Binning for UTI, he has been on doxycycline, and switched to nitrofurantoin recently. -In ED his workup significant for sepsis pressure initially soft, but this has responded to 1 L of fluid bolus, tachycardic, difficult leukocytosis, elevated lactic acid, positive UA, CT kidney stone significant for right kidney nephrosis, with gas, and distal stone obstruction largest at 4 mm, [facility] hospitalist consulted to admit    Assessment and Plan: 1-severe sepsis secondary to complicated UTI with infected kidney stone and right-sided hydronephrosis -Patient may criteria for severe sepsis at time of admission with elevated leukocytosis, lactic acidosis, hypotension and identified source of infection which was his urine. -blood Cultures demonstrating presence of E. coli and Enterobacterales  -Appreciate assistance and recommendation by urology service; patient is status post JJ stent placement and stone removal. -Continue current antibiotics and follow final culture results and sensitivity. -Continue to maintain adequate hydration. -Overall sepsis features improving  (WBCs trending down, lactic acid within normal limits and overall stable BP for the most part (normal MAP).   2-type 2 diabetes mellitus insulin-dependent -Current A1c 6.5 -Continue sliding scale insulin and follow CBG fluctuation to further adjust hypoglycemic regimen as needed.   3-ESRD -Normal schedule Monday, Wednesday and Friday -Appreciate assistance and recommendation by nephrology service -Actively receiving treatment today (05/30/2023). -Stable electrolytes.  4-hyperlipidemia -Continue statin. -Continue heart healthy diet.   5-large stool burden appreciated on imaging -Patient reports no abdominal pain nausea vomiting -Continue the use of laxative for bowel regimen. -Adequate hydration and increase physical activity discussed with patient.   6-hypertension/hypertension -Patient presented with soft blood pressure after hemodialysis at time of admission; this is stabilized after fluid resuscitation. -Continue the use of midodrine 10 mg 3 times a day -After dialysis today blood pressure also soft. -Small bolus of normal saline provided -Patient chronically on Cardizem -Continue to follow blood pressure stability.   7-history of palpitations/SVT -For the most part rate controlled -Continue home Cardizem and and closely follow on telemetry. -Soft blood pressure appreciated.    Subjective:  Currently afebrile, reports no chest pain, no nausea, no vomiting.  Overall feeling better.  Patient receiving dialysis at time of examination.  Physical Exam: Vitals:   05/30/23 1310 05/30/23 1523 05/30/23 1556 05/30/23 1600  BP: (!) 120/52 (!) 88/40  (!) 74/39  Pulse: (!) 106   88  Resp: 17 (!) 26  20  Temp:   97.9 F (36.6 C)   TempSrc:   Oral   SpO2: 100%   100%  Weight:      Height:       General exam: Alert, awake, Afebrile and reporting no chest pain, no  nausea or vomiting. Respiratory system: Clear to auscultation. Respiratory effort normal.  Good saturation on room  air.  No using accessory muscle. Cardiovascular system: Controlled, no rubs, no gallops, Gastrointestinal system: Abdomen is nondistended, soft and nontender. No organomegaly or masses felt. Normal bowel sounds heard. Central nervous system: A moving 4 limbs spontaneously.  No focal neurological deficits. Extremities: No cyanosis or clubbing. Skin: No petechiae. Psychiatry: Following commands appropriately; stable mood.  Data Reviewed: CBC: WBCs 19.7, hemoglobin 8.4, platelet count 111K Renal function panel: Sodium 132, potassium 3.8, chloride 98, bicarb 21, BUN 34, creatinine 3.08 and GFR 19.  Family Communication: wife at bedside.  Disposition: Status is: Inpatient Remains inpatient appropriate because: Continue IV antibiotics.   Planned Discharge Destination: Home  Time spent: 50 minutes  Author: Vassie Loll, MD 05/30/2023 4:27 PM  For on call review [email or web address].

## 2023-05-31 DIAGNOSIS — I10 Essential (primary) hypertension: Secondary | ICD-10-CM | POA: Diagnosis not present

## 2023-05-31 DIAGNOSIS — I2510 Atherosclerotic heart disease of native coronary artery without angina pectoris: Secondary | ICD-10-CM | POA: Diagnosis not present

## 2023-05-31 DIAGNOSIS — A419 Sepsis, unspecified organism: Secondary | ICD-10-CM | POA: Diagnosis not present

## 2023-05-31 DIAGNOSIS — N186 End stage renal disease: Secondary | ICD-10-CM | POA: Diagnosis not present

## 2023-05-31 LAB — URINE CULTURE: Culture: >=100000 — AB

## 2023-05-31 LAB — GLUCOSE, CAPILLARY
Glucose-Capillary: 98 mg/dL (ref 70–99)
Glucose-Capillary: 192 mg/dL — ABNORMAL HIGH (ref 70–99)
Glucose-Capillary: 130 mg/dL — ABNORMAL HIGH (ref 70–99)
Glucose-Capillary: 131 mg/dL — ABNORMAL HIGH (ref 70–99)

## 2023-05-31 LAB — HEPATITIS B SURFACE ANTIBODY, QUANTITATIVE: Hep B S AB Quant (Post): 52.3 m[IU]/mL

## 2023-05-31 NOTE — Assessment & Plan Note (Signed)
No chest pain.  Continue blood pressure monitoring.  

## 2023-05-31 NOTE — Assessment & Plan Note (Signed)
Follow up as outpatient.  

## 2023-05-31 NOTE — Assessment & Plan Note (Addendum)
Continue insulin sliding scale for glucose cover and monitoring Continue with statin therapy.

## 2023-05-31 NOTE — Plan of Care (Signed)
  Problem: Fluid Volume: Goal: Hemodynamic stability will improve Outcome: Progressing   Problem: Clinical Measurements: Goal: Diagnostic test results will improve Outcome: Progressing Goal: Signs and symptoms of infection will decrease Outcome: Progressing   Problem: Respiratory: Goal: Ability to maintain adequate ventilation will improve Outcome: Progressing   Problem: Education: Goal: Ability to describe self-care measures that may prevent or decrease complications (Diabetes Survival Skills Education) will improve Outcome: Progressing Goal: Individualized Educational Video(s) Outcome: Progressing   Problem: Coping: Goal: Ability to adjust to condition or change in health will improve Outcome: Progressing   Problem: Fluid Volume: Goal: Ability to maintain a balanced intake and output will improve Outcome: Progressing   Problem: Health Behavior/Discharge Planning: Goal: Ability to identify and utilize available resources and services will improve Outcome: Progressing Goal: Ability to manage health-related needs will improve Outcome: Progressing   Problem: Metabolic: Goal: Ability to maintain appropriate glucose levels will improve Outcome: Progressing   Problem: Nutritional: Goal: Maintenance of adequate nutrition will improve Outcome: Progressing Goal: Progress toward achieving an optimal weight will improve Outcome: Progressing   Problem: Skin Integrity: Goal: Risk for impaired skin integrity will decrease Outcome: Progressing   Problem: Tissue Perfusion: Goal: Adequacy of tissue perfusion will improve Outcome: Progressing   Problem: Education: Goal: Knowledge of General Education information will improve Description: Including pain rating scale, medication(s)/side effects and non-pharmacologic comfort measures Outcome: Progressing   Problem: Health Behavior/Discharge Planning: Goal: Ability to manage health-related needs will improve Outcome:  Progressing   Problem: Clinical Measurements: Goal: Ability to maintain clinical measurements within normal limits will improve Outcome: Progressing Goal: Will remain free from infection Outcome: Progressing Goal: Diagnostic test results will improve Outcome: Progressing Goal: Respiratory complications will improve Outcome: Progressing Goal: Cardiovascular complication will be avoided Outcome: Progressing   Problem: Activity: Goal: Risk for activity intolerance will decrease Outcome: Not Progressing   Problem: Nutrition: Goal: Adequate nutrition will be maintained Outcome: Not Progressing   Problem: Coping: Goal: Level of anxiety will decrease Outcome: Progressing   Problem: Elimination: Goal: Will not experience complications related to bowel motility Outcome: Progressing Goal: Will not experience complications related to urinary retention Outcome: Progressing   Problem: Pain Management: Goal: General experience of comfort will improve Outcome: Progressing   Problem: Safety: Goal: Ability to remain free from injury will improve Outcome: Progressing   Problem: Skin Integrity: Goal: Risk for impaired skin integrity will decrease Outcome: Not Progressing

## 2023-05-31 NOTE — Progress Notes (Signed)
Progress Note   Patient: [name] [id number] DOB: 1940/10/23 DOA: 05/28/2023     3 DOS: the patient was seen and examined on 05/31/2023   Brief hospital course: [name] was admitted to the hospital with infected right kidney stone, complicated with hydronephrosis, pyelonephritis and severe sepsis.   82 y.o. male, ith a history of type 2 diabetes on insulin, hypertension, ESRD requiring hemodialysis  -Patient was sent by his HD center for hypoglycemia, patient did receive a full treatment yesterday, he had altered mental status, shaking, CBG at HD center where 58, he was given oral glucose, recheck was at 61, received another dose of oral glucose and brought to ED for further evaluation, CBG in ED was at 84, he received D50, and it remained persistently> 100 after that, patient reports generalized weakness, fatigue, low-grade temperature at home with chills, he has been followed by Dr. Ronne Binning for UTI, he has been on doxycycline, and switched to nitrofurantoin recently.  CT kidney stone significant for right kidney nephrosis, with gas, and distal stone obstruction largest at 4 mm.  Patient was placed on antibiotic therapy and Urology was consulted.  10/24 underwent cystoscopy, right retrograde pyelography, Intraoperative fluoroscopy, under one hour, with interpretation and right 6 x 26 JJ stent placement  Urine culture positive for E coli and enterobacter.  10/25 HD per nephrology.   Assessment and Plan: * Sepsis secondary to UTI (HCC) Severe sepsis present on admission. Right sided hydronephrosis, urolithiasis with pyelonephritis.  Gram negative (E Coli), bacteremia.   Sp JJ stent placement and stone removal.  Urine culture positive for E coli sensitive to cephalosporins.  Lactic acid trended down.  Wbc is 19,7 and patient has been afebrile.   Plan to continue IV ceftriaxone. Follow up on cell count and temperature curve.  Hold midodrine for now.  Ok to  transfer to telemetry.   End stage renal disease (HCC) Hyponatremia.  Volume status is euvolemic today. Had HD yesterday.  K is 3,8 with bicarbonate 21 and BUN 34.  Na 132  Coronary atherosclerosis of native coronary artery No chest pain.  Continue blood pressure monitoring.   Essential hypertension, benign Continue blood pressure monitoring.  Continue diltiazem and will hold on midodrine.   Type 2 diabetes mellitus with hyperlipidemia (HCC) Continue insulin sliding scale for glucose cover and monitoring.  Continue with statin therapy.   Malignant neoplasm of prostate (HCC) Follow up as outpatient.         Subjective: Patient with no chest pain or dyspnea, no nausea or vomiting.   Physical Exam: Vitals:   05/31/23 0754 05/31/23 0800 05/31/23 0851 05/31/23 0900  BP:  (!) 117/48 (!) 117/48 (!) 127/46  Pulse:  86  88  Resp:  (!) 22  16  Temp: 98.7 F (37.1 C)     TempSrc: Oral     SpO2:  100%  98%  Weight:      Height:       Neurology awake and alert ENT with mild pallor Cardiovascular with S1 and S2 present and regular, positive systolic murmur at the right lower sternal border No JVD No lower extremity edema,  Respiratory with no rales or wheezing Abdomen with no distention and non tender HD fistula left upper extremity  Data Reviewed:    Family Communication: no family at the bedside   Disposition: Status is: Inpatient Remains inpatient appropriate because: recovering sepsis   Planned Discharge Destination: Home     Author: Coralie Keens, MD  05/31/2023 10:35 AM  For on call review [email or web address].

## 2023-05-31 NOTE — Hospital Course (Signed)
[name] was admitted to the hospital with infected right kidney stone, complicated with hydronephrosis, pyelonephritis and severe sepsis.   82 y.o. male, ith a history of type 2 diabetes on insulin, hypertension, ESRD requiring hemodialysis  -Patient was sent by his HD center for hypoglycemia, patient did receive a full treatment yesterday, he had altered mental status, shaking, CBG at HD center where 58, he was given oral glucose, recheck was at 61, received another dose of oral glucose and brought to ED for further evaluation, CBG in ED was at 84, he received D50, and it remained persistently> 100 after that, patient reports generalized weakness, fatigue, low-grade temperature at home with chills, he has been followed by Dr. Ronne Binning for UTI, he has been on doxycycline, and switched to nitrofurantoin recently.  CT kidney stone significant for right kidney nephrosis, with gas, and distal stone obstruction largest at 4 mm.  Patient was placed on antibiotic therapy and Urology was consulted.  10/24 underwent cystoscopy, right retrograde pyelography, Intraoperative fluoroscopy, under one hour, with interpretation and right 6 x 26 JJ stent placement  Urine culture positive for E coli and enterobacter.  10/25 HD per nephrology.

## 2023-05-31 NOTE — Assessment & Plan Note (Addendum)
Continue blood pressure monitoring.  Continue diltiazem. Midodrine has been discontinued with good toleration.

## 2023-05-31 NOTE — Assessment & Plan Note (Signed)
Hyponatremia.  Volume status is euvolemic today. Had HD yesterday.  K is 3,8 with bicarbonate 21 and BUN 34.  Na 132

## 2023-05-31 NOTE — Assessment & Plan Note (Addendum)
Severe sepsis present on admission. Right sided hydronephrosis, urolithiasis with pyelonephritis.  Gram negative (E Coli), bacteremia.   Sp JJ stent placement and stone removal.  Urine culture positive for E coli sensitive to cephalosporins.  Lactic acid trended down.  Wbc is 19,7 and patient has been afebrile.   Plan to continue IV ceftriaxone. Follow up on cell count and temperature curve.  Hold midodrine for now.  Ok to transfer to telemetry.

## 2023-06-01 DIAGNOSIS — I10 Essential (primary) hypertension: Secondary | ICD-10-CM | POA: Diagnosis not present

## 2023-06-01 DIAGNOSIS — A419 Sepsis, unspecified organism: Secondary | ICD-10-CM | POA: Diagnosis not present

## 2023-06-01 DIAGNOSIS — I2510 Atherosclerotic heart disease of native coronary artery without angina pectoris: Secondary | ICD-10-CM | POA: Diagnosis not present

## 2023-06-01 DIAGNOSIS — N186 End stage renal disease: Secondary | ICD-10-CM | POA: Diagnosis not present

## 2023-06-01 LAB — CBC
HCT: 26.6 % — ABNORMAL LOW (ref 39.0–52.0)
Hemoglobin: 8.3 g/dL — ABNORMAL LOW (ref 13.0–17.0)
MCH: 32.4 pg (ref 26.0–34.0)
MCHC: 31.2 g/dL (ref 30.0–36.0)
MCV: 103.9 fL — ABNORMAL HIGH (ref 80.0–100.0)
Platelets: 111 10*3/uL — ABNORMAL LOW (ref 150–400)
RBC: 2.56 MIL/uL — ABNORMAL LOW (ref 4.22–5.81)
RDW: 15.2 % (ref 11.5–15.5)
WBC: 10.9 10*3/uL — ABNORMAL HIGH (ref 4.0–10.5)
nRBC: 0 % (ref 0.0–0.2)

## 2023-06-01 LAB — GLUCOSE, CAPILLARY
Glucose-Capillary: 93 mg/dL (ref 70–99)
Glucose-Capillary: 217 mg/dL — ABNORMAL HIGH (ref 70–99)
Glucose-Capillary: 226 mg/dL — ABNORMAL HIGH (ref 70–99)
Glucose-Capillary: 261 mg/dL — ABNORMAL HIGH (ref 70–99)

## 2023-06-01 LAB — BASIC METABOLIC PANEL
Anion gap: 14 (ref 5–15)
BUN: 39 mg/dL — ABNORMAL HIGH (ref 8–23)
CO2: 22 mmol/L (ref 22–32)
Calcium: 7 mg/dL — ABNORMAL LOW (ref 8.9–10.3)
Chloride: 100 mmol/L (ref 98–111)
Creatinine, Ser: 3.59 mg/dL — ABNORMAL HIGH (ref 0.61–1.24)
GFR, Estimated: 16 mL/min — ABNORMAL LOW (ref >60)
Glucose, Bld: 111 mg/dL — ABNORMAL HIGH (ref 70–99)
Potassium: 3.8 mmol/L (ref 3.5–5.1)
Sodium: 136 mmol/L (ref 135–145)

## 2023-06-01 LAB — CULTURE, BLOOD (ROUTINE X 2): Special Requests: ADEQUATE

## 2023-06-01 LAB — HEPATITIS B SURFACE ANTIGEN: Hepatitis B Surface Ag: NONREACTIVE

## 2023-06-01 MED ORDER — CEPHALEXIN 250 MG PO CAPS
500.0000 mg | ORAL_CAPSULE | Freq: Three times a day (TID) | ORAL | Status: DC
  Administered 2023-06-01 – 2023-06-02 (×3): 500 mg via ORAL
  Filled 2023-06-01 (×3): qty 2

## 2023-06-01 NOTE — Plan of Care (Signed)
  Problem: Fluid Volume: Goal: Hemodynamic stability will improve Outcome: Progressing   Problem: Clinical Measurements: Goal: Diagnostic test results will improve Outcome: Progressing Goal: Signs and symptoms of infection will decrease Outcome: Progressing   Problem: Respiratory: Goal: Ability to maintain adequate ventilation will improve Outcome: Progressing   Problem: Education: Goal: Ability to describe self-care measures that may prevent or decrease complications (Diabetes Survival Skills Education) will improve Outcome: Progressing Goal: Individualized Educational Video(s) Outcome: Progressing   Problem: Fluid Volume: Goal: Ability to maintain a balanced intake and output will improve Outcome: Progressing   Problem: Health Behavior/Discharge Planning: Goal: Ability to identify and utilize available resources and services will improve Outcome: Progressing Goal: Ability to manage health-related needs will improve Outcome: Progressing   Problem: Metabolic: Goal: Ability to maintain appropriate glucose levels will improve Outcome: Progressing   Problem: Nutritional: Goal: Maintenance of adequate nutrition will improve Outcome: Progressing Goal: Progress toward achieving an optimal weight will improve Outcome: Progressing   Problem: Skin Integrity: Goal: Risk for impaired skin integrity will decrease Outcome: Progressing   Problem: Tissue Perfusion: Goal: Adequacy of tissue perfusion will improve Outcome: Progressing   Problem: Education: Goal: Knowledge of General Education information will improve Description: Including pain rating scale, medication(s)/side effects and non-pharmacologic comfort measures Outcome: Progressing   Problem: Health Behavior/Discharge Planning: Goal: Ability to manage health-related needs will improve Outcome: Progressing   Problem: Clinical Measurements: Goal: Ability to maintain clinical measurements within normal limits will  improve Outcome: Progressing Goal: Will remain free from infection Outcome: Progressing Goal: Diagnostic test results will improve Outcome: Progressing Goal: Respiratory complications will improve Outcome: Progressing Goal: Cardiovascular complication will be avoided Outcome: Progressing   Problem: Activity: Goal: Risk for activity intolerance will decrease Outcome: Progressing   Problem: Nutrition: Goal: Adequate nutrition will be maintained Outcome: Progressing   Problem: Coping: Goal: Level of anxiety will decrease Outcome: Progressing   Problem: Elimination: Goal: Will not experience complications related to bowel motility Outcome: Progressing Goal: Will not experience complications related to urinary retention Outcome: Progressing

## 2023-06-01 NOTE — Progress Notes (Addendum)
Progress Note   Patient: [name] CZY:[id number] DOB: 02-10-1941 DOA: 05/28/2023     4 DOS: the patient was seen and examined on 06/01/2023   Brief hospital course: [name] was admitted to the hospital with infected right kidney stone, complicated with hydronephrosis, pyelonephritis and severe sepsis.   82 y.o. male, ith a history of type 2 diabetes on insulin, hypertension, ESRD requiring hemodialysis  -Patient was sent by his HD center for hypoglycemia, patient did receive a full HD treatment, he had altered mental status, shaking. He was given oral glucose and brought to ED for further evaluation. CBG in ED was at 84, he received D50, and it remained persistently> 100 after that, patient reports generalized weakness, fatigue, low-grade temperature at home with chills, he has been followed by Dr. Ronne [name] for UTI, he has been on doxycycline, and switched to nitrofurantoin recently.  CT kidney stone significant for right kidney nephrosis, with gas, and distal stone obstruction largest at 4 mm.  Patient was placed on IV antibiotic therapy and Urology was consulted.  10/24 underwent cystoscopy, right retrograde pyelography, Intraoperative fluoroscopy, under one hour, with interpretation and right 6 x 26 JJ stent placement  Urine culture positive for E coli and enterobacter.  10/25 HD per nephrology.  10/27 transitioned to oral antibiotic therapy.   Assessment and Plan: * Sepsis secondary to UTI (HCC) Severe sepsis present on admission. Right sided hydronephrosis, urolithiasis with pyelonephritis.  Gram negative (E Coli), bacteremia.   Sp JJ stent placement and stone removal.  Urine culture positive for E coli sensitive to cephalosporins.  Lactic acid trended down.  Wbc is 10.9 and patient has been afebrile.   Plan to transition to oral antibiotic therapy with cephalexin.  He had 3 doses of IV ceftriaxone.  Continue monitoring cell count and temperature curve.   Need to check with Dr Ronne [name] when Foley catheter can be removed (it was inserted on 10/24 after ureteral stent placement).   End stage renal disease (HCC) Hyponatremia.  Volume status is euvolemic today.  BUN to day is 39, K is 3,8 and serum bicarbonate at 22. Plan for HD tomorrow per scheduled.  Has a left upper extremity fistula for HD access.   Essential hypertension, benign Continue blood pressure monitoring.  Continue diltiazem. Midodrine has been discontinued with good toleration.   SVT (supraventricular tachycardia) (HCC) Admission EKG with SVT rhythm. Telemetry with sinus rhythm with 1st degree AV block with occasional PVC, rate 100 bpm.   Will check EKG today. Continue diltiazem.  Ok to discontinue telemetry.   Coronary atherosclerosis of native coronary artery No chest pain.  Continue blood pressure monitoring.   Type 2 diabetes mellitus with hyperlipidemia (HCC) Hypoglycemia on admission.   Fasting glucose this am 111, capillary glucose 192, 130, 131, 93.  Continue insulin sliding scale for glucose cover and monitoring.  Continue with statin therapy.   Malignant neoplasm of prostate (HCC) Follow up as outpatient.     Subjective: Patient is feeling better, no chest pain or dyspnea. Uncomfortable with urinary cathter.   Physical Exam: Vitals:   06/01/23 0700 06/01/23 0800 06/01/23 0816 06/01/23 0817  BP: 138/67 (!) 136/50  (!) 136/50  Pulse: (!) 107 (!) 113    Resp: 15 (!) 24    Temp:   (!) 97.5 F (36.4 C)   TempSrc:   Oral   SpO2: 100% 100%    Weight:      Height:       Neurology awake  and alert. Positive disorientation but not agitation ENT with mild pallor Cardiovascular with S1 and S2 present, regular with no gallops, rubs or murmurs Respiratory with no rales or wheezing, no rhonchi Abdomen with no distention  No lower extremity edema Foley cathter in place, right groin central line in place.  Data Reviewed:    Family Communication:  no family at the bedside   Disposition: Status is: Inpatient Remains inpatient appropriate because: antibiotic therapy, resolving sepsis   Planned Discharge Destination: Home    Author: Coralie Keens, MD 06/01/2023 8:59 AM  For on call review [email or web address].

## 2023-06-01 NOTE — Assessment & Plan Note (Addendum)
Admission EKG with SVT rhythm. Telemetry with sinus rhythm with 1st degree AV block with occasional PVC, rate 100 bpm.   Will check EKG today. Continue diltiazem.  Ok to discontinue telemetry.

## 2023-06-02 ENCOUNTER — Other Ambulatory Visit: Payer: Self-pay

## 2023-06-02 DIAGNOSIS — N201 Calculus of ureter: Secondary | ICD-10-CM | POA: Diagnosis not present

## 2023-06-02 DIAGNOSIS — N186 End stage renal disease: Secondary | ICD-10-CM

## 2023-06-02 DIAGNOSIS — I471 Supraventricular tachycardia, unspecified: Secondary | ICD-10-CM

## 2023-06-02 DIAGNOSIS — I10 Essential (primary) hypertension: Secondary | ICD-10-CM

## 2023-06-02 DIAGNOSIS — C61 Malignant neoplasm of prostate: Secondary | ICD-10-CM

## 2023-06-02 DIAGNOSIS — E1169 Type 2 diabetes mellitus with other specified complication: Secondary | ICD-10-CM

## 2023-06-02 DIAGNOSIS — Z794 Long term (current) use of insulin: Secondary | ICD-10-CM

## 2023-06-02 DIAGNOSIS — I2510 Atherosclerotic heart disease of native coronary artery without angina pectoris: Secondary | ICD-10-CM

## 2023-06-02 DIAGNOSIS — A419 Sepsis, unspecified organism: Secondary | ICD-10-CM | POA: Diagnosis not present

## 2023-06-02 DIAGNOSIS — E1142 Type 2 diabetes mellitus with diabetic polyneuropathy: Secondary | ICD-10-CM | POA: Diagnosis not present

## 2023-06-02 DIAGNOSIS — E785 Hyperlipidemia, unspecified: Secondary | ICD-10-CM

## 2023-06-02 LAB — CBC
HCT: 29.4 % — ABNORMAL LOW (ref 39.0–52.0)
Hemoglobin: 9.4 g/dL — ABNORMAL LOW (ref 13.0–17.0)
MCH: 32.1 pg (ref 26.0–34.0)
MCHC: 32 g/dL (ref 30.0–36.0)
MCV: 100.3 fL — ABNORMAL HIGH (ref 80.0–100.0)
Platelets: 122 10*3/uL — ABNORMAL LOW (ref 150–400)
RBC: 2.93 MIL/uL — ABNORMAL LOW (ref 4.22–5.81)
RDW: 14.7 % (ref 11.5–15.5)
WBC: 9.3 10*3/uL (ref 4.0–10.5)
nRBC: 0 % (ref 0.0–0.2)

## 2023-06-02 LAB — GLUCOSE, CAPILLARY
Glucose-Capillary: 104 mg/dL — ABNORMAL HIGH (ref 70–99)
Glucose-Capillary: 222 mg/dL — ABNORMAL HIGH (ref 70–99)
Glucose-Capillary: 208 mg/dL — ABNORMAL HIGH (ref 70–99)

## 2023-06-02 LAB — BASIC METABOLIC PANEL
Anion gap: 13 (ref 5–15)
BUN: 51 mg/dL — ABNORMAL HIGH (ref 8–23)
CO2: 20 mmol/L — ABNORMAL LOW (ref 22–32)
Calcium: 6.8 mg/dL — ABNORMAL LOW (ref 8.9–10.3)
Chloride: 100 mmol/L (ref 98–111)
Creatinine, Ser: 4.46 mg/dL — ABNORMAL HIGH (ref 0.61–1.24)
GFR, Estimated: 12 mL/min — ABNORMAL LOW (ref >60)
Glucose, Bld: 103 mg/dL — ABNORMAL HIGH (ref 70–99)
Potassium: 3.7 mmol/L (ref 3.5–5.1)
Sodium: 133 mmol/L — ABNORMAL LOW (ref 135–145)

## 2023-06-02 MED ORDER — SALINE SPRAY 0.65 % NA SOLN
1.0000 | NASAL | Status: DC | PRN
  Administered 2023-06-02: 1 via NASAL
  Filled 2023-06-02: qty 44

## 2023-06-02 MED ORDER — PENTAFLUOROPROP-TETRAFLUOROETH EX AERO
INHALATION_SPRAY | CUTANEOUS | Status: AC
  Filled 2023-06-02: qty 30

## 2023-06-02 MED ORDER — FREESTYLE LIBRE 3 PLUS SENSOR MISC: 6 refills | Status: DC

## 2023-06-02 MED ORDER — CIPROFLOXACIN HCL 250 MG PO TABS
500.0000 mg | ORAL_TABLET | Freq: Every day | ORAL | Status: DC
  Administered 2023-06-02: 500 mg via ORAL
  Filled 2023-06-02: qty 2

## 2023-06-02 NOTE — Plan of Care (Signed)
  Problem: Acute Rehab OT Goals (only OT should resolve) Goal: Pt. Will Perform Grooming Flowsheets (Taken 06/02/2023 0930) Pt Will Perform Grooming:  with modified independence  standing Goal: Pt. Will Perform Upper Body Bathing Flowsheets (Taken 06/02/2023 0930) Pt Will Perform Upper Body Bathing:  with modified independence  sitting Goal: Pt. Will Perform Lower Body Bathing Flowsheets (Taken 06/02/2023 0930) Pt Will Perform Lower Body Bathing:  with modified independence  sitting/lateral leans Goal: Pt. Will Perform Upper Body Dressing Flowsheets (Taken 06/02/2023 0930) Pt Will Perform Upper Body Dressing:  with modified independence  sitting Goal: Pt. Will Perform Lower Body Dressing Flowsheets (Taken 06/02/2023 0930) Pt Will Perform Lower Body Dressing:  with modified independence  sitting/lateral leans  with adaptive equipment Goal: Pt. Will Transfer To Toilet Flowsheets (Taken 06/02/2023 0930) Pt Will Transfer to Toilet:  with modified independence  ambulating Goal: Pt. Will Perform Toileting-Clothing Manipulation Flowsheets (Taken 06/02/2023 0930) Pt Will Perform Toileting - Clothing Manipulation and hygiene:  with modified independence  sitting/lateral leans Goal: Pt/Caregiver Will Perform Home Exercise Program Flowsheets (Taken 06/02/2023 0930) Pt/caregiver will Perform Home Exercise Program:  Increased ROM  Increased strength  Both right and left upper extremity  Independently  Daliya Parchment OT, MOT

## 2023-06-02 NOTE — TOC Progression Note (Addendum)
Transition of Care (TOC) - Progression Note    Patient Details  Name: Mark BETIT Sr. MRN: [id number] Date of Birth: [birth year]  Transition of Care [facility] CM/SW Contact  Karn Cassis, [place] Phone Number: 06/02/2023, 10:50 AM  Clinical Narrative:  Pt agreeable to HHPT. No preference on agency. Referred and accepted by [facility] with Frances Furbish. Order in. OT recommending BSC. Pt's wife does not feel this is necessary. TOC will follow.        Barriers to Discharge: Continued Medical Work up  Expected Discharge Plan and Services In-house Referral: Clinical Social Work     Living arrangements for the past 2 months: Single Family Home                           HH Arranged: PT HH Agency: [facility] Date [facility] Agency Contacted: 06/02/23 Time HH Agency Contacted: 1049 Representative spoke with at [facility] Agency: Kandee Keen   Social Determinants of Health (SDOH) Interventions SDOH Screenings   Food Insecurity: No Food Insecurity (05/29/2023)  Housing: Low Risk  (05/29/2023)  Transportation Needs: No Transportation Needs (05/29/2023)  Utilities: Not At Risk (05/29/2023)  Alcohol Screen: Low Risk  (08/22/2022)  Depression (PHQ2-9): Low Risk  (12/31/2022)  Financial Resource Strain: Low Risk  (08/22/2022)  Physical Activity: Inactive (08/22/2022)  Social Connections: Moderately Isolated (08/22/2022)  Stress: No Stress Concern Present (08/22/2022)  Tobacco Use: Low Risk  (05/29/2023)    Readmission Risk Interventions    05/29/2023    2:05 PM 04/18/2021   12:16 PM 04/16/2021    3:07 PM  Readmission Risk Prevention Plan  Transportation Screening Complete  Complete  HRI or Home Care Consult Complete    Social Work Consult for Recovery Care Planning/Counseling Complete    Palliative Care Screening Not Applicable    Medication Review Oceanographer) Complete  Complete  HRI or Home Care Consult   Complete  SW Recovery Care/Counseling Consult   Complete  Palliative Care  Screening   Not Applicable  Skilled Nursing Facility  Not Applicable Not Complete

## 2023-06-02 NOTE — Inpatient Diabetes Management (Signed)
Inpatient Diabetes Program Recommendations  AACE/ADA: New Consensus Statement on Inpatient Glycemic Control   Target Ranges:  Prepandial:   less than 140 mg/dL      Peak postprandial:   less than 180 mg/dL (1-2 hours)      Critically ill patients:  140 - 180 mg/dL    Latest Reference Range & Units 06/01/23 07:32 06/01/23 11:36 06/01/23 15:45 06/01/23 19:34 06/02/23 07:33  Glucose-Capillary 70 - 99 mg/dL 93 161 (H) 096 (H) 045 (H) 104 (H)   Review of Glycemic Control  Diabetes history: DM2 Outpatient Diabetes medications: Lantus 6 units QAM, Novolog 2 units QAM on dialysis days Current orders for Inpatient glycemic control: Novolog 0-6 units TID with meals, Novolog 0-5 units QHS  Inpatient Diabetes Program Recommendations:    Insulin: May want to consider ordering Novolog 2 units TID with meals for meal coverage if patient eats at least 50% of meals.  Thanks, Orlando Penner, RN, MSN, CDCES Diabetes Coordinator Inpatient Diabetes Program [phone] (Team Pager from 8am to 5pm)

## 2023-06-02 NOTE — Progress Notes (Signed)
Progress Note   Patient: [name] UJW:[id number] DOB: 1941-01-27 DOA: 05/28/2023     5 DOS: the patient was seen and examined on 06/02/2023   Brief hospital admission course: As per H&P written by Dr. Randol Kern on 05/28/2023 Kalob Sakai  is a 82 y.o. male, ith a history of type 2 diabetes on insulin, hypertension, ESRD requiring hemodialysis  -Patient was sent by his HD center for hypoglycemia, patient did receive a full treatment yesterday, he had altered mental status, shaking, CBG at HD center where 58, he was given oral glucose, recheck was at 61, received another dose of oral glucose and brought to ED for further evaluation, CBG in ED was at 84, he received D50, and it remained persistently> 100 after that, patient reports generalized weakness, fatigue, low-grade temperature at home with chills, he has been followed by Dr. Ronne Binning for UTI, he has been on doxycycline, and switched to nitrofurantoin recently. -In ED his workup significant for sepsis pressure initially soft, but this has responded to 1 L of fluid bolus, tachycardic, difficult leukocytosis, elevated lactic acid, positive UA, CT kidney stone significant for right kidney nephrosis, with gas, and distal stone obstruction largest at 4 mm, [facility] hospitalist consulted to admit   Assessment and Plan: * Sepsis secondary to UTI (HCC) -Severe sepsis present on admission. Right sided -hydronephrosis, urolithiasis with pyelonephritis.  -Gram negative (E Coli), bacteremia.  -Sp JJ stent placement and stone removal by urology service -Foley catheter in place -Urine culture positive for E coli sensitive to cephalosporins.  -Lactic acid and WBCs now normalized -Blood pressure stable and patient currently not requiring any pressor support medication. -Following culture results antibiotics has been transitioned to oral route using ciprofloxacin.  End stage renal disease (HCC) -Appreciate assistance and recommendation by  nephrology service -Electrolytes and volume status stable -Continue IV iron and Epogen therapy as per nephrology service discretion. -Planning for hemodialysis treatment later today.  Patient normal schedule Monday, Wednesday and Friday.  Essential hypertension, benign -Continue diltiazem and -Blood pressure has now stabilized -No requiring any pressor support or midodrine at the moment. -Continue to follow vital signs.  SVT (supraventricular tachycardia) (HCC) -Continue treatment with Cardizem -Heart rate has remained stable -No need for telemetry monitoring.  Coronary atherosclerosis of native coronary artery -No chest pain or shortness of breath -Continue patient follow-up with cardiology service.  Type 2 diabetes mellitus with hyperlipidemia (HCC) -Hypoglycemia on admission; most likely associated with infection and sepsis physiology..  -Continue sliding scale insulin and follow CBGs fluctuation.  Malignant neoplasm of prostate (HCC) -Continue outpatient follow-up with urology/oncology service.   Subjective: Feeling much better, no nausea, no vomiting, no chest pain, no fever.  Good saturation on room air appreciated.  Physical Exam: Vitals:   06/02/23 1400 06/02/23 1500 06/02/23 1546 06/02/23 1600  BP:    (!) 158/42  Pulse: 82  (!) 124 (!) 125  Resp: (!) 22 (!) 28 20 (!) 22  Temp:    97.6 F (36.4 C)  TempSrc:    Oral  SpO2: 100%  100% 100%  Weight:      Height:       General exam: Alert, awake, in no acute distress; following commands appropriately.  No nausea, no vomiting, no chest pain no shortness of breath.  Reports feeling better. Respiratory system: Good saturation on room air; no using accessory muscle.  No wheezing or crackles appreciated on exam. Cardiovascular system: Rate controlled, no rubs, no gallops, no JVD appreciated. Gastrointestinal system: Abdomen  is nondistended, soft and nontender.  Positive bowel sounds. Central nervous system: Moving 4  limbs spontaneously.  No focal neurological deficits. Extremities: No cyanosis or clubbing.  No edema on exam. Skin: No petechiae.  Foley catheter in place. Psychiatry: Mood & affect appropriate.   Data Reviewed: Basic metabolic panel: Sodium 133, potassium 3.7, chloride 100, bicarb 20, BUN 51, creatinine 4.46, GFR 12 CBC: WBCs 9.3, hemoglobin 9.4, platelet count 1 22K   Family Communication: Wife updated at the bedside.  Disposition: Status is: Inpatient Remains inpatient appropriate because: antibiotic therapy, resolving sepsis.   Planned Discharge Destination: Home  Author: Vassie Loll, MD 06/02/2023 4:44 PM  For on call review [email or web address].

## 2023-06-02 NOTE — Evaluation (Signed)
Occupational Therapy Evaluation Patient Details Name: Mark MAILLE Sr. MRN: [id number] DOB: 12/05/1940 Today's Date: 06/02/2023   History of Present Illness [name]  is a 82 y.o. male, ith a history of type 2 diabetes on insulin, hypertension, ESRD requiring hemodialysis   -Patient was sent by his HD center for hypoglycemia, patient did receive a full treatment yesterday, he had altered mental status, shaking, CBG at HD center where 58, he was given oral glucose, recheck was at 61, received another dose of oral glucose and brought to ED for further evaluation, CBG in ED was at 84, he received D50, and it remained persistently> 100 after that, patient reports generalized weakness, fatigue, low-grade temperature at home with chills, he has been followed by Dr. Ronne Binning for UTI, he has been on doxycycline, and switched to nitrofurantoin recently.   Clinical Impression   Pt agreeable to OT evaluation. Pt reported need to have a bowel movement. Pt was assisted to the [facility]. CGA to min A from elevated bed surface. Min A needed to sit at EOB. Once on the [facility] pt required mod A to boost from the lower surface. Pt reported that his surfaces are all elevated at home and that he has a lift chair. B LE are generally weak. Pt was unable to complete lower body dressing but was able to complete peri-care with min A to ensure pt was clean. Pt was returned to bed for dialysis.       If plan is discharge home, recommend the following: A little help with bathing/dressing/bathroom;Assistance with cooking/housework;Assist for transportation;Help with stairs or ramp for entrance;A lot of help with walking and/or transfers;A little help with walking and/or transfers    Functional Status Assessment  Patient has had a recent decline in their functional status and demonstrates the ability to make significant improvements in function in a reasonable and predictable amount of time.  Equipment Recommendations   BSC/3in1 (Pt reported he does not have a BSC.)    Recommendations for Other Services       Precautions / Restrictions Precautions Precautions: Fall Restrictions Weight Bearing Restrictions: No      Mobility Bed Mobility Overal bed mobility: Needs Assistance Bed Mobility: Supine to Sit, Sit to Supine     Supine to sit: HOB elevated, Min assist Sit to supine: Min assist, HOB elevated   General bed mobility comments: Assist to pull to sit; assist to lift B LE into bed.    Transfers Overall transfer level: Needs assistance Equipment used: Rolling walker (2 wheels) Transfers: Sit to/from Stand, Bed to chair/wheelchair/BSC Sit to Stand: Mod assist, Contact guard assist     Step pivot transfers: Contact guard assist     General transfer comment: Mod A needed to boost from lower surfaces. CGA to min A for standing from elevated bed. More CGA once standing.      Balance Overall balance assessment: Needs assistance Sitting-balance support: Feet supported, No upper extremity supported Sitting balance-Leahy Scale: Good Sitting balance - Comments: seated at EOB   Standing balance support: Reliant on assistive device for balance, During functional activity, Bilateral upper extremity supported Standing balance-Leahy Scale: Fair Standing balance comment: using RW                           ADL either performed or assessed with clinical judgement   ADL Overall ADL's : Needs assistance/impaired     Grooming: Set up;Sitting   Upper Body Bathing: Set up;Sitting;Minimal  assistance   Lower Body Bathing: Moderate assistance;Sitting/lateral leans;Maximal assistance   Upper Body Dressing : Set up;Minimal assistance;Sitting   Lower Body Dressing: Moderate assistance;Maximal assistance;Sitting/lateral leans Lower Body Dressing Details (indicate cue type and reason): Pt unable to do today. Reports using grabber at baseline. Toilet Transfer: Stand-pivot;Contact guard  assist;Moderate assistance;Rolling walker (2 wheels) Toilet Transfer Details (indicate cue type and reason): CGA to min A from bed to BSC; BSC to bed was mod A using RW. Toileting- Clothing Manipulation and Hygiene: Contact guard assist;Minimal assistance;Sitting/lateral lean;Sit to/from stand Toileting - Clothing Manipulation Details (indicate cue type and reason): Mild assist for peri-care to ensure the area was clean.             Vision Baseline Vision/History: 1 Wears glasses (R eye is "weak") Ability to See in Adequate Light: 1 Impaired Patient Visual Report: No change from baseline Vision Assessment?:  (baseline deficits)     Perception Perception: Not tested       Praxis Praxis: Not tested       Pertinent Vitals/Pain Pain Assessment Pain Assessment: No/denies pain     Extremity/Trunk Assessment Upper Extremity Assessment Upper Extremity Assessment: Generalized weakness (3-/5 bilaterally at the shoulder)   Lower Extremity Assessment Lower Extremity Assessment: Defer to PT evaluation   Cervical / Trunk Assessment Cervical / Trunk Assessment: Kyphotic   Communication Communication Communication: Hearing impairment (L is good ear)   Cognition Arousal: Alert Behavior During Therapy: WFL for tasks assessed/performed Overall Cognitive Status: Within Functional Limits for tasks assessed                                                        Home Living Family/patient expects to be discharged to:: Private residence Living Arrangements: Spouse/significant other Available Help at Discharge: Family;Available 24 hours/day Type of Home: House Home Access: Ramped entrance     Home Layout: One level     Bathroom Shower/Tub: Other (comment) (walk in tub with door)   Bathroom Toilet: Standard Bathroom Accessibility: Yes   Home Equipment: Agricultural consultant (2 wheels);Cane - single point;Grab bars - tub/shower;Shower seat - built in;Lift chair    Additional Comments: Reports high bed, elevated toilet.      Prior Functioning/Environment Prior Level of Function : Needs assist       Physical Assist : Mobility (physical);ADLs (physical)   ADLs (physical): IADLs Mobility Comments: Reports ambulation using the rollator. ADLs Comments: Reports independent ADL.        OT Problem List: Decreased strength;Decreased range of motion;Decreased activity tolerance;Impaired balance (sitting and/or standing)      OT Treatment/Interventions: Self-care/ADL training;Therapeutic exercise;Therapeutic activities;Patient/family education;Balance training    OT Goals(Current goals can be found in the care plan section) Acute Rehab OT Goals Patient Stated Goal: return home OT Goal Formulation: With patient Time For Goal Achievement: 06/16/23 Potential to Achieve Goals: Good  OT Frequency: Min 2X/week    End of Session Equipment Utilized During Treatment: Rolling walker (2 wheels) Nurse Communication: Mobility status  Activity Tolerance: Patient tolerated treatment well Patient left: in bed;with call bell/phone within reach;with nursing/sitter in room  OT Visit Diagnosis: Unsteadiness on feet (R26.81);Other abnormalities of gait and mobility (R26.89);Muscle weakness (generalized) (M62.81)                Time: 5284-1324 OT Time Calculation (min): 37 min Charges:  OT General Charges $OT Visit: 1 Visit OT Evaluation $OT Eval Low Complexity: 1 Low OT Treatments $Self Care/Home Management : 8-22 mins  Mikaylah Libbey OT, MOT   Danie Chandler 06/02/2023, 9:27 AM

## 2023-06-02 NOTE — Progress Notes (Signed)
Heart rate has consistently remained over 100 since returning from dialysis.  Cardizem was held this morning d/t dialysis.  Rhythm has remained a-fib all shift, voices no complaints other than feeling fatigued after dialysis

## 2023-06-02 NOTE — Progress Notes (Signed)
   HEMODIALYSIS TREATMENT NOTE:  Uneventful 3.5 hour heparin-free treatment completed. Goal met: 2 liters removed.  UF OFF during last 5 minutes of tx d/t low BP.  All blood was returned.  Post-HD:  06/02/23 1350  Vitals  Temp 97.9 F (36.6 C)  Temp Source Oral  BP (!) 141/68  MAP (mmHg) 89  BP Location Right Arm  BP Method Automatic  Patient Position (if appropriate) Lying  Pulse Rate 98  Pulse Rate Source Monitor  ECG Heart Rate (!) 107  Resp 20  Oxygen Therapy  SpO2 97 %  O2 Device Room Air  Post Treatment  Dialyzer Clearance Lightly streaked  Hemodialysis Intake (mL) 0 mL  Liters Processed 84  Fluid Removed (mL) 2000 mL  Tolerated HD Treatment Yes  Post-Hemodialysis Comments Goal met  AVG/AVF Arterial Site Held (minutes) 10 minutes  AVG/AVF Venous Site Held (minutes) 10 minutes    Arman Filter, RN AP [facility]

## 2023-06-02 NOTE — Progress Notes (Signed)
  [facility] Progress Note    Assessment/ Plan:   Sepsis 2/2 urolithiasis E coli bacteremia -s/p JJ stent and stone extraction. Per urology and primary. S/p rocephin, transitioned to keflex  ESRD: HD on MWF schedule, HD today  Hypotension, h/o HTN: off midodrine, now hypertensive, will UF as tolerated  Anemia of ESRD: Hgb 9.4, not due for ESA or Fe yet, will check Fe panel. Transfuse prn for hgb <7. Avoid Fe  Metabolic Bone Disease: last po4 3.7-acceptable  Nutrition: albumin very low- push protein  Outpatient HD orders: [facility], MWF.  4 hours.  EDW 65.8.  LUE AVG 15-gauge needles.  Flow rates: 400/500.  3K/3 calcium.  Heparin: 1500 units bolus.  Meds: Mircera 50 mcg every 4 weeks (last dose 10/18, Venofer 50 mg once weekly (last dose 10/21), calcitriol 0.75 mcg every treatment  Anthony Sar, MD [facility]  Subjective:   Patient seen and examined bedside. No acute events overnight. No complaints. He is wondering when he can get his foley out   Objective:   BP (!) 161/74   Pulse 90   Temp 97.8 F (36.6 C) (Oral)   Resp 19   Ht 5\' 8"  (1.727 m)   Wt 70.3 kg   SpO2 100%   BMI 23.57 kg/m   Intake/Output Summary (Last 24 hours) at 06/02/2023 1610 Last data filed at 06/02/2023 9604 Gross per 24 hour  Intake 480 ml  Output 625 ml  Net -145 ml   Weight change:   Physical Exam: Gen: NAD CVS: RRR Resp: CTA B/L Abd: soft Ext: no edema b/l Les Neuro: awake, alert Dialysis access: LUE AVG +b/t  Imaging: No results found.  Labs: BMET Recent Labs  Lab 05/28/23 1738 05/29/23 0504 05/30/23 0859 06/01/23 0340 06/02/23 0522  NA 134* 132* 132* 136 133*  K 3.9 3.6 3.8 3.8 3.7  CL 94* 97* 98 100 100  CO2 24 25 21* 22 20*  GLUCOSE 161* 207* 137* 111* 103*  BUN 14 23 34* 39* 51*  CREATININE 2.19* 2.76* 3.08* 3.59* 4.46*  CALCIUM 8.3* 7.7* 7.2* 7.0* 6.8*  PHOS  --  4.5 3.7  --   --    CBC Recent Labs  Lab 05/28/23 1626 05/30/23 0859  06/01/23 0340 06/02/23 0522  WBC 29.4* 19.7* 10.9* 9.3  NEUTROABS 28.5*  --   --   --   HGB 12.1* 8.4* 8.3* 9.4*  HCT 38.2* 26.6* 26.6* 29.4*  MCV 105.5* 106.0* 103.9* 100.3*  PLT 118* 111* 111* 122*    Medications:     (feeding supplement) PROSource Plus  30 mL Oral BID BM   cephALEXin  500 mg Oral Q8H   Chlorhexidine Gluconate Cloth  6 each Topical Daily   Chlorhexidine Gluconate Cloth  6 each Topical Q0600   diazepam  1 mg Oral QHS   diltiazem  240 mg Oral Daily   heparin  5,000 Units Subcutaneous Q8H   insulin aspart  0-5 Units Subcutaneous QHS   insulin aspart  0-6 Units Subcutaneous TID WC   pravastatin  10 mg Oral q1800   sodium chloride flush  10-40 mL Intracatheter Q12H      Anthony Sar, MD [facility] 06/02/2023, 8:21 AM

## 2023-06-03 ENCOUNTER — Encounter (HOSPITAL_COMMUNITY): Payer: Self-pay | Admitting: Urology

## 2023-06-03 DIAGNOSIS — I471 Supraventricular tachycardia, unspecified: Secondary | ICD-10-CM | POA: Diagnosis not present

## 2023-06-03 DIAGNOSIS — A419 Sepsis, unspecified organism: Secondary | ICD-10-CM | POA: Diagnosis not present

## 2023-06-03 DIAGNOSIS — C61 Malignant neoplasm of prostate: Secondary | ICD-10-CM | POA: Diagnosis not present

## 2023-06-03 DIAGNOSIS — I10 Essential (primary) hypertension: Secondary | ICD-10-CM | POA: Diagnosis not present

## 2023-06-03 LAB — CULTURE, BLOOD (ROUTINE X 2): Special Requests: ADEQUATE

## 2023-06-03 LAB — FERRITIN: Ferritin: 947 ng/mL — ABNORMAL HIGH (ref 24–336)

## 2023-06-03 LAB — GLUCOSE, CAPILLARY
Glucose-Capillary: 101 mg/dL — ABNORMAL HIGH (ref 70–99)
Glucose-Capillary: 231 mg/dL — ABNORMAL HIGH (ref 70–99)

## 2023-06-03 LAB — IRON AND TIBC
Iron: 43 ug/dL — ABNORMAL LOW (ref 45–182)
Saturation Ratios: 32 % (ref 17.9–39.5)
TIBC: 137 ug/dL — ABNORMAL LOW (ref 250–450)
UIBC: 94 ug/dL

## 2023-06-03 MED ORDER — CHLORHEXIDINE GLUCONATE CLOTH 2 % EX PADS
6.0000 | MEDICATED_PAD | Freq: Every day | CUTANEOUS | Status: DC
  Administered 2023-06-03: 6 via TOPICAL

## 2023-06-03 MED ORDER — INSULIN GLARGINE-YFGN 100 UNIT/ML ~~LOC~~ SOLN
5.0000 [IU] | Freq: Every day | SUBCUTANEOUS | Status: DC
  Administered 2023-06-03: 5 [IU] via SUBCUTANEOUS
  Filled 2023-06-03 (×2): qty 0.05

## 2023-06-03 MED ORDER — CIPROFLOXACIN HCL 500 MG PO TABS: 500.0000 mg | ORAL_TABLET | Freq: Every day | ORAL | 0 refills | Status: AC

## 2023-06-03 NOTE — Discharge Summary (Signed)
Physician Discharge Summary   Patient: [name]. MRN: [id number] DOB: 1940/08/15  Admit date:     05/28/2023  Discharge date: 06/03/23  Discharge Physician: Vassie Loll   PCP: Donita Brooks, MD   Recommendations at discharge:  Repeat CBC to follow hemoglobin trend/stability Reassess blood pressure and adjust antihypertensive treatment as needed Continue to follow CBGs fluctuation with further adjustment to hypoglycemia regimen as required Repeat basic metabolic panel to follow electrolytes. -Patient will continue antibiotics as prescribed and follow-up with urology service. Resume outpatient hemodialysis management.  Discharge Diagnoses: Principal Problem:   Sepsis secondary to UTI [facility]) Active Problems:   End stage renal disease (HCC)   Essential hypertension, benign   SVT (supraventricular tachycardia) (HCC)   Coronary atherosclerosis of native coronary artery   Type 2 diabetes mellitus with hyperlipidemia (HCC)   Malignant neoplasm of prostate [facility])  Brief hospital admission course: As per H&P written by Dr. Randol Kern on 05/28/2023 [name]  is a 82 y.o. male, ith a history of type 2 diabetes on insulin, hypertension, ESRD requiring hemodialysis  -Patient was sent by his HD center for hypoglycemia, patient did receive a full treatment yesterday, he had altered mental status, shaking, CBG at HD center where 58, he was given oral glucose, recheck was at 61, received another dose of oral glucose and brought to ED for further evaluation, CBG in ED was at 84, he received D50, and it remained persistently> 100 after that, patient reports generalized weakness, fatigue, low-grade temperature at home with chills, he has been followed by Dr. Ronne Binning for UTI, he has been on doxycycline, and switched to nitrofurantoin recently. -In ED his workup significant for sepsis pressure initially soft, but this has responded to 1 L of fluid bolus, tachycardic, difficult  leukocytosis, elevated lactic acid, positive UA, CT kidney stone significant for right kidney nephrosis, with gas, and distal stone obstruction largest at 4 mm, [facility] consulted to admit    Assessment and Plan: Sepsis secondary to UTI (HCC) -Severe sepsis present on admission. Right sided -hydronephrosis, urolithiasis with pyelonephritis.  -Gram negative (E Coli), bacteremia.  -Sp JJ stent placement and stone removal by urology service -Foley catheter in place -Urine culture positive for E coli sensitive to cephalosporins.  -Lactic acid and WBCs now normalized -Blood pressure stable and patient currently not requiring any pressor support medication. -Following culture results antibiotics has been transitioned to oral route using ciprofloxacin; plan is for 5 more days of antibiotics at discharge.. -After discussing with urology service okay to remove Foley catheter at time of discharge.   End stage renal disease (HCC) -Appreciate assistance and recommendation by nephrology service -Electrolytes and volume status stable -Continue IV iron and Epogen therapy as per nephrology service discretion. -Planning for hemodialysis treatment later today.  Patient normal schedule Monday, Wednesday and Friday.   Essential hypertension, benign -Continue diltiazem and patient encouraged to follow low-sodium diet.   -Continue dialysis management for further blood pressure/volume status adjustment. -No requiring any pressor support or midodrine at the moment.   SVT (supraventricular tachycardia) (HCC) -Continue treatment with Cardizem -Heart rate has remained stable   Coronary atherosclerosis of native coronary artery -No chest pain or shortness of breath -Continue patient follow-up with cardiology service.   Type 2 diabetes mellitus with hyperlipidemia (HCC) -Hypoglycemia on admission; most likely associated with infection and sepsis physiology..  -Continue sliding scale insulin and  follow CBGs fluctuation.   Malignant neoplasm of prostate (HCC) -Continue outpatient follow-up with urology/oncology service.  Consultants: Nephrology service, urology. Procedures performed: See below for x-ray reports.  Status post double-J stent and stone removal by urology service. Disposition: Home with home health services. Diet recommendation: Heart healthy/low-sodium and modified carbohydrate diet.  DISCHARGE MEDICATION: Allergies as of 06/03/2023       Reactions   Enalapril Maleate Cough        Medication List     STOP taking these medications    doxycycline 100 MG capsule Commonly known as: VIBRAMYCIN   nitrofurantoin (macrocrystal-monohydrate) 100 MG capsule Commonly known as: MACROBID       TAKE these medications    cinacalcet 30 MG tablet Commonly known as: SENSIPAR SMARTSIG:1 Tablet(s) By Mouth Every Evening   ciprofloxacin 500 MG tablet Commonly known as: CIPRO Take 1 tablet (500 mg total) by mouth at bedtime for 5 days.   diazepam 2 MG tablet Commonly known as: VALIUM TAKE 1 TABLET BY MOUTH EVERY NIGHT AT BEDTIME AS NEEDED FOR INSOMNIA   diltiazem 240 MG 24 hr capsule Commonly known as: CARDIZEM CD TAKE 1 CAPSULE(240 MG) BY MOUTH DAILY   FreeStyle Libre 3 Plus Sensor Misc Change sensor every 15 days.   HYDROcodone-acetaminophen 5-325 MG tablet Commonly known as: NORCO/VICODIN Take 1 tablet by mouth every 12 (twelve) hours as needed for severe pain. Must last 30 days. Start taking on: June 10, 2023 What changed: Another medication with the same name was removed. Continue taking this medication, and follow the directions you see here.   insulin aspart 100 UNIT/ML injection Commonly known as: novoLOG Inject 8 Units into the skin 3 (three) times daily with meals. What changed:  how much to take when to take this additional instructions   insulin glargine 100 UNIT/ML injection Commonly known as: LANTUS Inject 0.12 mLs (12 Units  total) into the skin at bedtime. What changed:  how much to take when to take this additional instructions   lidocaine-prilocaine cream Commonly known as: EMLA APPLY SMALL AMOUNT TO ACCESS SITE (AVF) 30 MINUTES BEFORE DIALYSIS. COVER WITH OCCLUSIVE DRESSING (SARAN WRAP)   pravastatin 10 MG tablet Commonly known as: PRAVACHOL TAKE 1 TABLET(10 MG) BY MOUTH DAILY WITH BREAKFAST   sevelamer carbonate 800 MG tablet Commonly known as: RENVELA Take 800 mg by mouth 3 (three) times daily with meals.   Velphoro 500 MG chewable tablet Generic drug: sucroferric oxyhydroxide Chew 500 mg by mouth 3 (three) times daily with meals.               Durable Medical Equipment  (From admission, onward)           Start     Ordered   06/02/23 0932  For home use only DME 3 n 1  Once        06/02/23 0931            Follow-up Information     [facility] Follow up.   Specialty: Home Health Services Why: Will contact you to schedule home health visits. Contact information: [address] [place] [address] [phone]         Donita Brooks, MD. Schedule an appointment as soon as possible for a visit in 10 day(s).   Specialty: Family Medicine Contact information: [address] [place] [address] [phone]                Discharge Exam: Filed Weights   06/02/23 0930 06/02/23 1350 06/03/23 0210  Weight: 65.3 kg 63.2 kg 65.1  kg   General exam: Alert, awake, in no acute distress; following commands appropriately.  No nausea, no vomiting, no chest pain no shortness of breath.  Reports feeling better. Respiratory system: Good saturation on room air; no using accessory muscle.  No wheezing or crackles appreciated on exam. Cardiovascular system: Rate controlled, no rubs, no gallops, no JVD appreciated. Gastrointestinal system: Abdomen is nondistended, soft and nontender.  Positive bowel sounds. Central nervous system: Moving 4  limbs spontaneously.  No focal neurological deficits. Extremities: No cyanosis or clubbing.  No edema on exam. Skin: No petechiae.  Foley catheter in place. Psychiatry: Mood & affect appropriate.   Condition at discharge: Stable and improved.  The results of significant diagnostics from this hospitalization (including imaging, microbiology, ancillary and laboratory) are listed below for reference.   Imaging Studies: DG C-Arm 1-60 Min-No Report  Result Date: 05/29/2023 Fluoroscopy was utilized by the requesting physician.  No radiographic interpretation.   CT Renal Stone Study  Result Date: 05/28/2023 CLINICAL DATA:  Flank pain. EXAM: CT ABDOMEN AND PELVIS WITHOUT CONTRAST TECHNIQUE: Multidetector CT imaging of the abdomen and pelvis was performed following the standard protocol without IV contrast. RADIATION DOSE REDUCTION: This exam was performed according to the departmental dose-optimization program which includes automated exposure control, adjustment of the mA and/or kV according to patient size and/or use of iterative reconstruction technique. COMPARISON:  March 20, 2013. FINDINGS: Lower chest: Minimal bibasilar subsegmental atelectasis is noted. Hepatobiliary: Status post cholecystectomy. Mild intrahepatic and extrahepatic biliary dilatation is noted most likely due to post cholecystectomy status. No definite acute hepatic abnormality seen on these unenhanced images. Pancreas: Diffuse pancreatic atrophy is noted. No acute abnormality seen. Spleen: Normal in size without focal abnormality. Adrenals/Urinary Tract: Adrenal glands are unremarkable. Left renal atrophy is noted. Urinary bladder is not well visualized due to scatter artifact arising from bilateral hip arthroplasties. Moderate right hydroureteronephrosis is noted which contains both gas and fluid. There appears to be multiple distal right ureteral calculi with the largest measuring 4 mm. Stomach/Bowel: Mild gastric distention is  noted. Stool is noted throughout the colon. There is no definite evidence of bowel obstruction or inflammation. The appendix is not visualized. Vascular/Lymphatic: Aortic atherosclerosis. No enlarged abdominal or pelvic lymph nodes. Reproductive: Prostate is not well visualized due to scatter artifact arising from bilateral hip arthroplasties. Other: No ascites or hernia is noted. Musculoskeletal: No acute or significant osseous findings. IMPRESSION: Moderate right hydroureteronephrosis is noted which contains both gas and fluid in the intrarenal collecting system and ureter. This may represent ascending urinary tract infection. Multiple distal right ureteral calculi are noted which may be obstructive, the largest measuring 4 mm. Mild gastric distention is noted. Stool is noted throughout the colon. Prostate gland and urinary bladder are not well visualized due to scatter artifact arising from bilateral hip arthroplasties. Aortic Atherosclerosis (ICD10-I70.0). Electronically Signed   By: Lupita Raider M.D.   On: 05/28/2023 20:28   DG Chest Port 1 View  Result Date: 05/28/2023 CLINICAL DATA:  [id number] Sepsis [facility]) [id number] EXAM: PORTABLE CHEST 1 VIEW COMPARISON:  Chest x-ray 04/16/2021, CT chest 12/02/2018 FINDINGS: The heart and mediastinal contours are unchanged. Aortic calcification. Low lung volumes. No focal consolidation. No pulmonary edema. No pleural effusion. No pneumothorax. No acute osseous abnormality. IMPRESSION: 1. Low lung volumes with no active disease. 2.  Aortic Atherosclerosis (ICD10-I70.0). Electronically Signed   By: Tish Frederickson M.D.   On: 05/28/2023 19:44    Microbiology: Results for orders placed or performed during  the hospital encounter of 05/28/23  Culture, blood (Routine x 2)     Status: Abnormal   Collection Time: 05/28/23  4:26 PM   Specimen: BLOOD RIGHT FOREARM  Result Value Ref Range Status   Specimen Description   Final    BLOOD RIGHT FOREARM Performed at [facility], [address] [place], [place] [address]    Special Requests   Final    BOTTLES DRAWN AEROBIC AND ANAEROBIC Blood Culture adequate volume Performed at [facility], [address], [place], [place] [address]    Culture  Setup Time   Final    GRAM NEGATIVE RODS IN BOTH AEROBIC AND ANAEROBIC BOTTLES CRITICAL RESULT CALLED TO, READ BACK BY AND VERIFIED WITH: JESSICA FERRAINOLO [id number], VIRAY,J CRITICAL RESULT CALLED TO, READ BACK BY AND VERIFIED WITH: PHARMD STEVEN H 1220 [id number] FCP Performed at [facility], [address], [place], [place] [address]    Culture ESCHERICHIA COLI (A)  Final   Report Status 06/01/2023 FINAL  Final   Organism ID, Bacteria ESCHERICHIA COLI  Final   Organism ID, Bacteria ESCHERICHIA COLI  Final      Susceptibility   Escherichia coli - MIC*    AMPICILLIN >=32 RESISTANT Resistant     CEFEPIME <=0.12 SENSITIVE Sensitive     CEFTAZIDIME <=1 SENSITIVE Sensitive     CEFTRIAXONE <=0.25 SENSITIVE Sensitive     CIPROFLOXACIN <=0.25 SENSITIVE Sensitive     GENTAMICIN <=1 SENSITIVE Sensitive     IMIPENEM <=0.25 SENSITIVE Sensitive     TRIMETH/SULFA <=20 SENSITIVE Sensitive     AMPICILLIN/SULBACTAM 16 INTERMEDIATE Intermediate     PIP/TAZO <=4 SENSITIVE Sensitive ug/mL   Escherichia coli - KIRBY BAUER*    CEFAZOLIN INTERMEDIATE Intermediate     * ESCHERICHIA COLI    ESCHERICHIA COLI  Blood Culture ID Panel (Reflexed)     Status: Abnormal   Collection Time: 05/28/23  4:26 PM  Result Value Ref Range Status   Enterococcus faecalis NOT DETECTED NOT DETECTED Final   Enterococcus Faecium NOT DETECTED NOT DETECTED Final   Listeria monocytogenes NOT DETECTED NOT DETECTED Final   Staphylococcus species NOT DETECTED NOT DETECTED Final   Staphylococcus aureus (BCID) NOT DETECTED NOT DETECTED Final   Staphylococcus epidermidis NOT DETECTED NOT DETECTED Final   Staphylococcus lugdunensis NOT DETECTED NOT DETECTED Final   Streptococcus species NOT DETECTED NOT  DETECTED Final   Streptococcus agalactiae NOT DETECTED NOT DETECTED Final   Streptococcus pneumoniae NOT DETECTED NOT DETECTED Final   Streptococcus pyogenes NOT DETECTED NOT DETECTED Final   A.calcoaceticus-baumannii NOT DETECTED NOT DETECTED Final   Bacteroides fragilis NOT DETECTED NOT DETECTED Final   Enterobacterales DETECTED (A) NOT DETECTED Final    Comment: Enterobacterales represent a large order of gram negative bacteria, not a single organism. CRITICAL RESULT CALLED TO, READ BACK BY AND VERIFIED WITH: PHARMD STEVEN H 1220 [id number] FCP    Enterobacter cloacae complex NOT DETECTED NOT DETECTED Final   Escherichia coli DETECTED (A) NOT DETECTED Final    Comment: CRITICAL RESULT CALLED TO, READ BACK BY AND VERIFIED WITH: PHARMD STEVEN H 1220 [id number] FCP    Klebsiella aerogenes NOT DETECTED NOT DETECTED Final   Klebsiella oxytoca NOT DETECTED NOT DETECTED Final   Klebsiella pneumoniae NOT DETECTED NOT DETECTED Final   Proteus species NOT DETECTED NOT DETECTED Final   Salmonella species NOT DETECTED NOT DETECTED Final   Serratia marcescens NOT DETECTED NOT DETECTED Final   Haemophilus influenzae NOT DETECTED NOT DETECTED Final  Neisseria meningitidis NOT DETECTED NOT DETECTED Final   Pseudomonas aeruginosa NOT DETECTED NOT DETECTED Final   Stenotrophomonas maltophilia NOT DETECTED NOT DETECTED Final   Candida albicans NOT DETECTED NOT DETECTED Final   Candida auris NOT DETECTED NOT DETECTED Final   Candida glabrata NOT DETECTED NOT DETECTED Final   Candida krusei NOT DETECTED NOT DETECTED Final   Candida parapsilosis NOT DETECTED NOT DETECTED Final   Candida tropicalis NOT DETECTED NOT DETECTED Final   Cryptococcus neoformans/gattii NOT DETECTED NOT DETECTED Final   CTX-M ESBL NOT DETECTED NOT DETECTED Final   Carbapenem resistance IMP NOT DETECTED NOT DETECTED Final   Carbapenem resistance KPC NOT DETECTED NOT DETECTED Final   Carbapenem resistance NDM NOT DETECTED NOT  DETECTED Final   Carbapenem resist OXA 48 LIKE NOT DETECTED NOT DETECTED Final   Carbapenem resistance VIM NOT DETECTED NOT DETECTED Final    Comment: Performed at [facility], [address], [place], [place] [address]  Culture, blood (Routine x 2)     Status: Abnormal   Collection Time: 05/28/23  4:54 PM   Specimen: BLOOD  Result Value Ref Range Status   Specimen Description   Final    BLOOD BLOOD RIGHT HAND Performed at [facility], [address], [place], [place] [address]    Special Requests   Final    BOTTLES DRAWN AEROBIC ONLY Blood Culture adequate volume Performed at [facility], [address] [place], [place] [address]    Culture  Setup Time   Final    GRAM NEGATIVE RODS Performed at [facility] Gram Stain Report Called to,Read Back By and Verified With: DAHTTARAI,S AT 1919 ON 10.26.24 BY ISLEY,B AEROBIC BOTTLE ONLY    Culture (A)  Final    ESCHERICHIA COLI SUSCEPTIBILITIES PERFORMED ON PREVIOUS CULTURE WITHIN THE LAST 5 DAYS. Performed at [facility], [address], [place], [place] [address]    Report Status 06/03/2023 FINAL  Final  Urine Culture     Status: Abnormal   Collection Time: 05/28/23  7:45 PM   Specimen: Urine, Random  Result Value Ref Range Status   Specimen Description   Final    URINE, RANDOM Performed at [facility], [address], [place], [place] [address]    Special Requests   Final    NONE Reflexed from [id number] Performed at [facility], [address] [place], [place] [address]    Culture >=100,000 COLONIES/mL ESCHERICHIA COLI (A)  Final   Report Status 05/31/2023 FINAL  Final   Organism ID, Bacteria ESCHERICHIA COLI (A)  Final      Susceptibility   Escherichia coli - MIC*    AMPICILLIN >=32 RESISTANT Resistant     CEFAZOLIN <=4 SENSITIVE Sensitive     CEFEPIME <=0.12 SENSITIVE Sensitive     CEFTRIAXONE <=0.25 SENSITIVE Sensitive     CIPROFLOXACIN <=0.25 SENSITIVE Sensitive     GENTAMICIN <=1 SENSITIVE  Sensitive     IMIPENEM <=0.25 SENSITIVE Sensitive     NITROFURANTOIN <=16 SENSITIVE Sensitive     TRIMETH/SULFA <=20 SENSITIVE Sensitive     AMPICILLIN/SULBACTAM 16 INTERMEDIATE Intermediate     PIP/TAZO <=4 SENSITIVE Sensitive ug/mL    * >=100,000 COLONIES/mL ESCHERICHIA COLI  MRSA Next Gen by PCR, Nasal     Status: None   Collection Time: 05/29/23  9:07 AM   Specimen: Nasal Mucosa; Nasal Swab  Result Value Ref Range Status   MRSA by PCR Next Gen NOT DETECTED NOT DETECTED Final  Comment: (NOTE) The GeneXpert MRSA Assay (FDA approved for NASAL specimens only), is one component of a comprehensive MRSA colonization surveillance program. It is not intended to diagnose MRSA infection nor to guide or monitor treatment for MRSA infections. Test performance is not FDA approved in patients less than 17 years old. Performed at [facility], [address], [place], [place] [address]     Labs: CBC: Recent Labs  Lab 05/28/23 1626 05/30/23 0859 06/01/23 0340 06/02/23 0522  WBC 29.4* 19.7* 10.9* 9.3  NEUTROABS 28.5*  --   --   --   HGB 12.1* 8.4* 8.3* 9.4*  HCT 38.2* 26.6* 26.6* 29.4*  MCV 105.5* 106.0* 103.9* 100.3*  PLT 118* 111* 111* 122*   Basic Metabolic Panel: Recent Labs  Lab 05/28/23 1738 05/29/23 0504 05/30/23 0859 06/01/23 0340 06/02/23 0522  NA 134* 132* 132* 136 133*  K 3.9 3.6 3.8 3.8 3.7  CL 94* 97* 98 100 100  CO2 24 25 21* 22 20*  GLUCOSE 161* 207* 137* 111* 103*  BUN 14 23 34* 39* 51*  CREATININE 2.19* 2.76* 3.08* 3.59* 4.46*  CALCIUM 8.3* 7.7* 7.2* 7.0* 6.8*  PHOS  --  4.5 3.7  --   --    Liver Function Tests: Recent Labs  Lab 05/28/23 1738 05/29/23 0504 05/30/23 0859  AST 35  --   --   ALT 39  --   --   ALKPHOS 356*  --   --   BILITOT 1.2  --   --   PROT 5.4*  --   --   ALBUMIN 2.1* 1.8* 1.7*   CBG: Recent Labs  Lab 06/02/23 0733 06/02/23 1611 06/02/23 1948 06/03/23 0807 06/03/23 1118  GLUCAP 104* 222* 208* 101* 231*    Discharge  time spent: greater than 30 minutes.  Signed: Vassie Loll, MD [facility] 06/03/2023

## 2023-06-03 NOTE — TOC Transition Note (Signed)
Transition of Care (TOC) - CM/SW Discharge Note   Patient Details  Name: Mark MALLARY Sr. MRN: [id number] Date of Birth: 1940/10/27  Transition of Care [facility]) CM/SW Contact:  Karn Cassis, LCSW Phone Number: 06/03/2023, 2:41 PM   Clinical Narrative:  Pt d/c later today. Cory with [facility] notified. HHPT/OT orders in.      Final next level of care: Home w Home Health Services Barriers to Discharge: Barriers Resolved   Patient Goals and CMS Choice   Choice offered to / list presented to : Spouse  Discharge Placement                         Discharge Plan and Services Additional resources added to the After Visit Summary for   In-house Referral: Clinical Social Work                        HH Arranged: PT, OT HH Agency: [facility] Date [facility] Agency Contacted: 06/02/23 Time HH Agency Contacted: 1049 Representative spoke with at [facility] Agency: Kandee Keen  Social Determinants of Health (SDOH) Interventions SDOH Screenings   Food Insecurity: No Food Insecurity (05/29/2023)  Housing: Low Risk  (05/29/2023)  Transportation Needs: No Transportation Needs (05/29/2023)  Utilities: Not At Risk (05/29/2023)  Alcohol Screen: Low Risk  (08/22/2022)  Depression (PHQ2-9): Low Risk  (12/31/2022)  Financial Resource Strain: Low Risk  (08/22/2022)  Physical Activity: Inactive (08/22/2022)  Social Connections: Moderately Isolated (08/22/2022)  Stress: No Stress Concern Present (08/22/2022)  Tobacco Use: Low Risk  (05/29/2023)     Readmission Risk Interventions    05/29/2023    2:05 PM 04/18/2021   12:16 PM 04/16/2021    3:07 PM  Readmission Risk Prevention Plan  Transportation Screening Complete  Complete  HRI or Home Care Consult Complete    Social Work Consult for Recovery Care Planning/Counseling Complete    Palliative Care Screening Not Applicable    Medication Review Ocean[employer]) Complete  Complete  HRI or Home Care Consult   Complete  SW  Recovery Care/Counseling Consult   Complete  Palliative Care Screening   Not Applicable  Skilled Nursing Facility  Not Applicable Not Complete

## 2023-06-03 NOTE — Progress Notes (Signed)
[facility] NEPHROLOGY PROGRESS NOTE  Assessment/ Plan: Pt is a 82 y.o. yo male   Outpatient HD orders: [facility], MWF.  4 hours.  EDW 65.8.  LUE AVG 15-gauge needles.  Flow rates: 400/500.  3K/3 calcium.  Heparin: 1500 units bolus.  Meds: Mircera 50 mcg every 4 weeks (last dose 10/18, Venofer 50 mg once weekly (last dose 10/21), calcitriol 0.75 mcg every treatment   # Sepsis 2/2 urolithiasis/ E coli bacteremia -s/p JJ stent and stone extraction. Per urology and primary. S/p rocephin, transitioned to cipro.   # ESRD: HD on MWF schedule, next HD tomorrow.   # HTN/volume: On diltiazem.  UF with HD.  Monitor BP.   #Anemia of ESRD: Hgb 9.4, last dose of Mircera on 10/18.   # CKD-MBD: Corrected calcium and phosphorus level acceptable.    #Nutrition: albumin very low-encourage oral intake, push protein.  Subjective: Seen and examined in ICU.  Denies nausea, vomiting, chest pain or shortness of breath.  No new event.  Tolerated dialysis well with 2 L UF yesterday. Objective Vital signs in last 24 hours: Vitals:   06/03/23 0600 06/03/23 0700 06/03/23 0800 06/03/23 0808  BP: (!) 155/75  (!) 167/144   Pulse: (!) 40 (!) 110 (!) 120   Resp: 11 14 20    Temp:    (!) 97.3 F (36.3 C)  TempSrc:    Oral  SpO2: 100% 100% 99%   Weight:      Height:       Weight change: -5 kg  Intake/Output Summary (Last 24 hours) at 06/03/2023 1001 Last data filed at 06/03/2023 0900 Gross per 24 hour  Intake 1040 ml  Output 2250 ml  Net -1210 ml       Labs: RENAL PANEL Recent Labs  Lab 05/28/23 1738 05/29/23 0504 05/30/23 0859 06/01/23 0340 06/02/23 0522  NA 134* 132* 132* 136 133*  K 3.9 3.6 3.8 3.8 3.7  CL 94* 97* 98 100 100  CO2 24 25 21* 22 20*  GLUCOSE 161* 207* 137* 111* 103*  BUN 14 23 34* 39* 51*  CREATININE 2.19* 2.76* 3.08* 3.59* 4.46*  CALCIUM 8.3* 7.7* 7.2* 7.0* 6.8*  PHOS  --  4.5 3.7  --   --   ALBUMIN 2.1* 1.8* 1.7*  --   --     Liver Function Tests: Recent  Labs  Lab 05/28/23 1738 05/29/23 0504 05/30/23 0859  AST 35  --   --   ALT 39  --   --   ALKPHOS 356*  --   --   BILITOT 1.2  --   --   PROT 5.4*  --   --   ALBUMIN 2.1* 1.8* 1.7*   No results for input(s): "LIPASE", "AMYLASE" in the last 168 hours. No results for input(s): "AMMONIA" in the last 168 hours. CBC: Recent Labs    02/11/23 0843 02/27/23 1130 05/28/23 1626 05/30/23 0859 06/01/23 0340 06/02/23 0522 06/03/23 0508  HGB 8.6*  --  12.1* 8.4* 8.3* 9.4*  --   MCV 101.5*  --  105.5* 106.0* 103.9* 100.3*  --   VITAMINB12 497 493  --   --   --   --   --   FERRITIN  --  1,076*  --   --   --   --  947*  TIBC  --   --   --   --   --   --  137*  IRON  --  80  --   --   --   --  43*    Cardiac Enzymes: No results for input(s): "CKTOTAL", "CKMB", "CKMBINDEX", "TROPONINI" in the last 168 hours. CBG: Recent Labs  Lab 06/01/23 1934 06/02/23 0733 06/02/23 1611 06/02/23 1948 06/03/23 0807  GLUCAP 261* 104* 222* 208* 101*    Iron Studies:  Recent Labs    06/03/23 0508  IRON 43*  TIBC 137*  FERRITIN 947*   Studies/Results: No results found.  Medications: Infusions:   Scheduled Medications:  (feeding supplement) PROSource Plus  30 mL Oral BID BM   Chlorhexidine Gluconate Cloth  6 each Topical Daily   Chlorhexidine Gluconate Cloth  6 each Topical Q0600   ciprofloxacin  500 mg Oral QHS   diazepam  1 mg Oral QHS   diltiazem  240 mg Oral Daily   heparin  5,000 Units Subcutaneous Q8H   insulin aspart  0-5 Units Subcutaneous QHS   insulin aspart  0-6 Units Subcutaneous TID WC   insulin glargine-yfgn  5 Units Subcutaneous Daily   pravastatin  10 mg Oral q1800   sodium chloride flush  10-40 mL Intracatheter Q12H    have reviewed scheduled and prn medications.  Physical Exam: General:NAD, comfortable Heart:RRR, s1s2 nl Lungs:clear b/l, no crackle Abdomen:soft, Non-tender, non-distended Extremities:No edema Dialysis Access: Left upper extremity AV graft has  good thrill and bruit.  Lajuana Patchell Prasad Shahzad Thomann 06/03/2023,10:01 AM  LOS: 6 days

## 2023-06-03 NOTE — Progress Notes (Signed)
Physical Therapy Treatment Patient Details Name: Mark STOOP Sr. MRN: [id number] DOB: 1940-11-25 Today's Date: 06/03/2023   History of Present Illness [name]  is a 82 y.o. male, ith a history of type 2 diabetes on insulin, hypertension, ESRD requiring hemodialysis   -Patient was sent by his HD center for hypoglycemia, patient did receive a full treatment yesterday, he had altered mental status, shaking, CBG at HD center where 58, he was given oral glucose, recheck was at 61, received another dose of oral glucose and brought to ED for further evaluation, CBG in ED was at 84, he received D50, and it remained persistently> 100 after that, patient reports generalized weakness, fatigue, low-grade temperature at home with chills, he has been followed by Dr. Ronne Binning for UTI, he has been on doxycycline, and switched to nitrofurantoin recently.    PT Comments  Pt friendly, motivated and willing to participate with therapy today.  Pt main difficulty with standing from low surfaced due to generalized LE weakness.  Pt reports he has chair lift and elevated bed at home.  RW used for stability during gait, no LOB episodes.  Min-mod A required with standing from [facility].  EOS pt left in chair with call bell within reach, limited mainly by fatigue.      If plan is discharge home, recommend the following:     Can travel by private vehicle        Equipment Recommendations       Recommendations for Other Services       Precautions / Restrictions Precautions Precautions: Fall Restrictions Weight Bearing Restrictions: No     Mobility  Bed Mobility Overal bed mobility: Modified Independent Bed Mobility: Supine to Sit     Supine to sit: HOB elevated, Min assist     General bed mobility comments: Able to roll to side and use of Lt UE/elbow to assist, min A to sit    Transfers Overall transfer level: Needs assistance Equipment used: Rolling walker (2 wheels) Transfers: Sit to/from Stand,  Bed to chair/wheelchair/BSC Sit to Stand: Min assist, Mod assist           General transfer comment: Pt able to place feet and hands in proper position, required min A to stand from [facility], CGA to stand from bed with elevated height    Ambulation/Gait Ambulation/Gait assistance: Contact guard assist Gait Distance (Feet): 15 Feet Assistive device: Rolling walker (2 wheels) Gait Pattern/deviations: Decreased step length - left, Decreased stance time - right, Decreased stride length, Trunk flexed Gait velocity: decreased     General Gait Details: slow labored slightly unsteady movement without loss of balance, limited mostly due to c/o fatigue   Stairs             Wheelchair Mobility     Tilt Bed    Modified Rankin (Stroke Patients Only)       Balance                                            Cognition Arousal: Alert Behavior During Therapy: WFL for tasks assessed/performed Overall Cognitive Status: Within Functional Limits for tasks assessed                                          Exercises  General Exercises - Lower Extremity Long Arc Quad: AROM, Both, 10 reps, Seated, Strengthening Toe Raises: AROM, Strengthening, Both, 10 reps Heel Raises: AROM, Both, 10 reps, Seated    General Comments        Pertinent Vitals/Pain Pain Assessment Pain Assessment: No/denies pain    Home Living                          Prior Function            PT Goals (current goals can now be found in the care plan section)      Frequency           PT Plan      Co-evaluation              AM-PAC PT "6 Clicks" Mobility   Outcome Measure  Help needed turning from your back to your side while in a flat bed without using bedrails?: None Help needed moving from lying on your back to sitting on the side of a flat bed without using bedrails?: A Little Help needed moving to and from a bed to a chair (including a  wheelchair)?: A Little Help needed standing up from a chair using your arms (e.g., wheelchair or bedside chair)?: A Little Help needed to walk in hospital room?: A Little Help needed climbing 3-5 steps with a railing? : A Lot 6 Click Score: 18    End of Session Equipment Utilized During Treatment: Gait belt Activity Tolerance: Patient tolerated treatment well;Patient limited by fatigue Patient left: in chair;with call bell/phone within reach Nurse Communication: Mobility status       Time: 0925-0950 PT Time Calculation (min) (ACUTE ONLY): 25 min  Charges:    $Therapeutic Activity: 23-37 mins PT General Charges $$ ACUTE PT VISIT: 1 Visit                     Becky Sax, LPTA/CLT; [facility]IS [phone]  [name] 06/03/2023, 10:07 AM

## 2023-06-03 NOTE — Progress Notes (Signed)
Pt.discharged home with wife. All belongings verified and sent home. All discharge instructions,medications and Rx to pick up reviewed.  All questions and concerns addressed. Pt.escorted to car with 2 RN's and assisted to vehicle and helped inside with no complications.  Pt's wife has walker in car and verbalizes no concerns with getting pt.in house. Advised to call fire department if any complications arise.

## 2023-06-04 ENCOUNTER — Telehealth: Payer: Self-pay

## 2023-06-04 DIAGNOSIS — E876 Hypokalemia: Secondary | ICD-10-CM | POA: Diagnosis not present

## 2023-06-04 DIAGNOSIS — N186 End stage renal disease: Secondary | ICD-10-CM | POA: Diagnosis not present

## 2023-06-04 DIAGNOSIS — D631 Anemia in chronic kidney disease: Secondary | ICD-10-CM | POA: Diagnosis not present

## 2023-06-04 DIAGNOSIS — N2581 Secondary hyperparathyroidism of renal origin: Secondary | ICD-10-CM | POA: Diagnosis not present

## 2023-06-04 DIAGNOSIS — D689 Coagulation defect, unspecified: Secondary | ICD-10-CM | POA: Diagnosis not present

## 2023-06-04 DIAGNOSIS — Z992 Dependence on renal dialysis: Secondary | ICD-10-CM | POA: Diagnosis not present

## 2023-06-04 NOTE — Transitions of Care (Post Inpatient/ED Visit) (Signed)
06/04/2023  Name: [name]. MRN: [id number] DOB: 1941-06-05  Today's TOC FU Call Status: Today's TOC FU Call Status:: Unsuccessful Call (1st Attempt) Unsuccessful Call (1st Attempt) Date: 06/04/23  Attempted to reach the patient regarding the most recent Inpatient/ED visit.  Follow Up Plan: Additional outreach attempts will be made to reach the patient to complete the Transitions of Care (Post Inpatient/ED visit) call.   Lonia Chimera, RN, BSN, CEN [employer]- Transition of Care Team.  [facility] [phone]

## 2023-06-05 ENCOUNTER — Telehealth: Payer: Self-pay

## 2023-06-05 DIAGNOSIS — N186 End stage renal disease: Secondary | ICD-10-CM | POA: Diagnosis not present

## 2023-06-05 DIAGNOSIS — E1122 Type 2 diabetes mellitus with diabetic chronic kidney disease: Secondary | ICD-10-CM | POA: Diagnosis not present

## 2023-06-05 DIAGNOSIS — Z992 Dependence on renal dialysis: Secondary | ICD-10-CM | POA: Diagnosis not present

## 2023-06-05 NOTE — Transitions of Care (Post Inpatient/ED Visit) (Signed)
06/05/2023  Name: Mark Art Sr. MRN: [id number] DOB: [birth year]  Today's TOC FU Call Status: Today's TOC FU Call Status:: Successful TOC FU Call Completed TOC FU Call Complete Date: 06/05/23 Patient's Name and Date of Birth confirmed.  Transition Care Management Follow-up Telephone Call How have you been since you were released from the hospital?: Better (Doing well, working at desk. Got a shower today.) Any questions or concerns?: No  Items Reviewed: Did you receive and understand the discharge instructions provided?: Yes Medications obtained,verified, and reconciled?: Yes (Medications Reviewed) Any new allergies since your discharge?: No Dietary orders reviewed?: Yes Type of Diet Ordered:: limits salt Do you have support at home?: Yes People in Home: spouse Name of Support/Comfort Primary Source: wife  Medications Reviewed Today: Medications Reviewed Today     Reviewed by Earlie Server, RN (Registered Nurse) on 06/05/23 at 1543  Med List Status: <None>   Medication Order Taking? Sig Documenting Provider Last Dose Status Informant  cinacalcet (SENSIPAR) 30 MG tablet [id number] No SMARTSIG:1 Tablet(s) By Mouth Every Evening  Patient not taking: Reported on 05/28/2023   [provider] Not Taking Consider Medication Status and Discontinue (Discontinued by provider) Self           Med Note (WARD, Melvern Sample May 28, 2023  7:56 PM) Pt doesn't remember this medication but seemed confused about any medication he takes at or during dialysis.  ciprofloxacin (CIPRO) 500 MG tablet [id number] Yes Take 1 tablet (500 mg total) by mouth at bedtime for 5 days. Vassie Loll, MD Taking Active   Continuous Glucose Sensor (FREESTYLE LIBRE 3 PLUS SENSOR) [place] [id number] Yes Change sensor every 15 days. Donita Brooks, MD Taking Active   diazepam (VALIUM) 2 MG tablet [id number] Yes TAKE 1 TABLET BY MOUTH EVERY NIGHT AT BEDTIME AS NEEDED FOR INSOMNIA Donita Brooks, MD  Taking Active Self  diltiazem (CARDIZEM CD) 240 MG 24 hr capsule [id number] Yes TAKE 1 CAPSULE(240 MG) BY MOUTH DAILY Donita Brooks, MD Taking Active Self  HYDROcodone-acetaminophen (NORCO/VICODIN) 5-325 MG tablet [id number] Yes Take 1 tablet by mouth every 12 (twelve) hours as needed for severe pain. Must last 30 days. Edward Jolly, MD Taking Active Self  insulin aspart (NOVOLOG) 100 UNIT/ML injection [id number] Yes Inject 8 Units into the skin 3 (three) times daily with meals.  Patient taking differently: Inject 2 Units into the skin in the morning. Takes on dialysis days   Erick Blinks, DO Taking Active Self  insulin glargine (LANTUS) 100 UNIT/ML injection [id number] Yes Inject 0.12 mLs (12 Units total) into the skin at bedtime.  Patient taking differently: Inject 6 Units into the skin in the morning. Takes on days he doesn't have dialysis   Donita Brooks, MD Taking Active Self  lidocaine-prilocaine (EMLA) cream [id number] Yes APPLY SMALL AMOUNT TO ACCESS SITE (AVF) 30 MINUTES BEFORE DIALYSIS. COVER WITH OCCLUSIVE DRESSING (SARAN WRAP) Donita Brooks, MD Taking Active Self  pravastatin (PRAVACHOL) 10 MG tablet [id number] Yes TAKE 1 TABLET(10 MG) BY MOUTH DAILY WITH BREAKFAST Donita Brooks, MD Taking Active Self  sevelamer carbonate (RENVELA) 800 MG tablet [id number] Yes Take 800 mg by mouth 3 (three) times daily with meals. [provider] Taking Active Self           Med Note Elesa Massed, Melvern Sample May 28, 2023  7:56 PM) Pt doesn't remember this medication but seemed confused about any medication he takes at or during dialysis.  VELPHORO 500 MG chewable tablet [id number] No Chew 500 mg by mouth 3 (three) times daily with meals.  Patient not taking: Reported on 05/28/2023   [provider] Not Taking Active Self           Med Note Elesa Massed, Melvern Sample May 28, 2023  7:56 PM) Pt doesn't remember this medication but seemed confused about any medication he takes at  or during dialysis.   Med List Note Vernie Ammons, RN 04/01/23 1341): UDS 12/31/2022 Pain contract signed. LP 09/18/21 MM 1205/24            Home Care and Equipment/Supplies: Were Home Health Services Ordered?: Yes Name of Home Health Agency:: [facility] Has Agency set up a time to come to your home?: No EMR reviewed for Home Health Orders: Orders present/patient has not received call (refer to CM for follow-up) (patient refused) Any new equipment or medical supplies ordered?: NA  Functional Questionnaire: Do you need assistance with bathing/showering or dressing?: No Do you need assistance with meal preparation?: No Do you need assistance with eating?: No Do you have difficulty maintaining continence: No Do you need assistance with getting out of bed/getting out of a chair/moving?: No Do you have difficulty managing or taking your medications?: No  Follow up appointments reviewed: PCP Follow-up appointment confirmed?: No (declines need to see PCP) Specialist Hospital Follow-up appointment confirmed?: Yes Follow-Up Specialty Provider:: nephrology, goes to dialysis MWF Do you need transportation to your follow-up appointment?: No Do you understand care options if your condition(s) worsen?: Yes-patient verbalized understanding  SDOH Interventions Today    Flowsheet Row Most Recent Value  SDOH Interventions   Food Insecurity Interventions Intervention Not Indicated  Housing Interventions Intervention Not Indicated  Transportation Interventions Intervention Not Indicated  Utilities Interventions Intervention Not Indicated      Interventions:  Spoke with patient about the importance of follow up with PCP. Encouraged patient to get an appointment and he declined. Reviewed Discharge instructions and discharge medications. Patient mentions about cost of binders. Encouraged patient to call [facility] to inquire. Also encouraged patient to talk to pharmacist at [facility] about  coupon cards.   Offered 30 day TOC program and patient declines. Provided my contact information and encouraged patient to call me if needed.  Lonia Chimera, RN, BSN, CEN [employer]- Transition of Care Team.  [facility] [phone]

## 2023-06-06 DIAGNOSIS — N186 End stage renal disease: Secondary | ICD-10-CM | POA: Diagnosis not present

## 2023-06-06 DIAGNOSIS — D689 Coagulation defect, unspecified: Secondary | ICD-10-CM | POA: Diagnosis not present

## 2023-06-06 DIAGNOSIS — N2581 Secondary hyperparathyroidism of renal origin: Secondary | ICD-10-CM | POA: Diagnosis not present

## 2023-06-06 DIAGNOSIS — Z992 Dependence on renal dialysis: Secondary | ICD-10-CM | POA: Diagnosis not present

## 2023-06-09 ENCOUNTER — Ambulatory Visit: Payer: PPO | Admitting: Urology

## 2023-06-09 DIAGNOSIS — N186 End stage renal disease: Secondary | ICD-10-CM | POA: Diagnosis not present

## 2023-06-09 DIAGNOSIS — Z992 Dependence on renal dialysis: Secondary | ICD-10-CM | POA: Diagnosis not present

## 2023-06-09 DIAGNOSIS — N2581 Secondary hyperparathyroidism of renal origin: Secondary | ICD-10-CM | POA: Diagnosis not present

## 2023-06-09 DIAGNOSIS — D631 Anemia in chronic kidney disease: Secondary | ICD-10-CM | POA: Diagnosis not present

## 2023-06-09 DIAGNOSIS — D689 Coagulation defect, unspecified: Secondary | ICD-10-CM | POA: Diagnosis not present

## 2023-06-09 DIAGNOSIS — D509 Iron deficiency anemia, unspecified: Secondary | ICD-10-CM | POA: Diagnosis not present

## 2023-06-09 DIAGNOSIS — E876 Hypokalemia: Secondary | ICD-10-CM | POA: Diagnosis not present

## 2023-06-09 NOTE — Progress Notes (Unsigned)
Name: Mark NEIDIG Sr. DOB: 1940-09-12 MRN: [id number]  Diagnoses: 1) Post-operative state  HPI: Mark Art Sr. presents post-operatively. He is accompanied by his wife. - GU history: 1. Prostate cancer.  - Underwent robotic-assisted laparoscopic prostatectomy in March 2011.  2. Kidney stones. 3. ESRD on hemodialysis (MWF). Makes 30-50cc of urine daily.  4. Erectile dysfunction.  He underwent the following procedure on 05/29/2023 by Dr. Ronne Binning:  Preoperative diagnosis: right UPJ stone, sepsis, severe hydronephrosis   Postoperative diagnosis: Same   Procedure:  1. cystoscopy 2. right retrograde pyelography 3. Intraoperative fluoroscopy, under one hour, with interpretation 4. right 6 x 26 JJ stent placement  Postop course: Today He reports feeling much better. He denies increased urinary urgency, frequency, nocturia, dysuria, gross hematuria, hesitancy, straining to void, or sensations of incomplete emptying. He denies flank pain or abdominal pain. He denies fevers, nausea, or vomiting.   Fall Screening: Do you usually have a device to assist in your mobility? Yes - walker   Medications: Current Outpatient Medications  Medication Sig Dispense Refill   cinacalcet (SENSIPAR) 30 MG tablet SMARTSIG:1 Tablet(s) By Mouth Every Evening (Patient not taking: Reported on 05/28/2023)     Continuous Glucose Sensor (FREESTYLE LIBRE 3 PLUS SENSOR) MISC Change sensor every 15 days. 2 each 6   diazepam (VALIUM) 2 MG tablet TAKE 1 TABLET BY MOUTH EVERY NIGHT AT BEDTIME AS NEEDED FOR INSOMNIA 30 tablet 2   diltiazem (CARDIZEM CD) 240 MG 24 hr capsule TAKE 1 CAPSULE(240 MG) BY MOUTH DAILY 90 capsule 3   HYDROcodone-acetaminophen (NORCO/VICODIN) 5-325 MG tablet Take 1 tablet by mouth every 12 (twelve) hours as needed for severe pain. Must last 30 days. 60 tablet 0   insulin aspart (NOVOLOG) 100 UNIT/ML injection Inject 8 Units into the skin 3 (three) times daily with meals. (Patient  taking differently: Inject 2 Units into the skin in the morning. Takes on dialysis days) 10 mL 11   insulin glargine (LANTUS) 100 UNIT/ML injection Inject 0.12 mLs (12 Units total) into the skin at bedtime. (Patient taking differently: Inject 6 Units into the skin in the morning. Takes on days he doesn't have dialysis) 10 mL 11   lidocaine-prilocaine (EMLA) cream APPLY SMALL AMOUNT TO ACCESS SITE (AVF) 30 MINUTES BEFORE DIALYSIS. COVER WITH OCCLUSIVE DRESSING (SARAN WRAP) 30 g 11   pravastatin (PRAVACHOL) 10 MG tablet TAKE 1 TABLET(10 MG) BY MOUTH DAILY WITH BREAKFAST 90 tablet 1   sevelamer carbonate (RENVELA) 800 MG tablet Take 800 mg by mouth 3 (three) times daily with meals.     VELPHORO 500 MG chewable tablet Chew 500 mg by mouth 3 (three) times daily with meals. (Patient not taking: Reported on 05/28/2023)     No current facility-administered medications for this visit.    Allergies: Allergies  Allergen Reactions   Enalapril Maleate Cough    Past Medical History:  Diagnosis Date   Arthritis    lower back   Chronic back pain    Disc disease   Chronic kidney disease    Coronary atherosclerosis of native coronary artery    Coronary calcifications by chest CT, Myoview demonstrating inferolateral scar   Deafness in right ear    Diabetes mellitus    Diabetic retinopathy (HCC)    moderate nonproliferative with macular edema in right eye   Diastolic dysfunction 12/2011   Grade 1.   Essential hypertension, benign    Heart murmur    in past   HOH (hard of hearing)  Hyperkalemia    Kidney stones    Mixed hyperlipidemia    NAFLD (nonalcoholic fatty liver disease)    Pancreatic insufficiency    Diagnosed at [facility]   Prostate cancer [facility]) 2011   Shortness of breath dyspnea    occasional - liver pressing on right lung, decreased capacity   Subdural hematoma (HCC)    Type 2 diabetes mellitus (HCC)    Wears hearing aid    left   Past Surgical History:  Procedure Laterality  Date   APPENDECTOMY     AV FISTULA PLACEMENT Left 05/15/2021   Procedure: INSERTION OF LEFT ARM ARTERIOVENOUS (AV) GORE-TEX GRAFT;  Surgeon: Larina Earthly, MD;  Location: [facility];  Service: Vascular;  Laterality: Left;   Bilateral hip replacement     CATARACT EXTRACTION W/PHACO Left 05/15/2015   Procedure: CATARACT EXTRACTION PHACO AND INTRAOCULAR LENS PLACEMENT (IOC);  Surgeon: Sherald Hess, MD;  Location: [facility];  Service: Ophthalmology;  Laterality: Left;  DIABETIC - insulin pump and oral meds   CATARACT EXTRACTION W/PHACO Right 08/28/2015   Procedure: CATARACT EXTRACTION PHACO AND INTRAOCULAR LENS PLACEMENT (IOC);  Surgeon: Sherald Hess, MD;  Location: [facility];  Service: Ophthalmology;  Laterality: Right;  DIABETIC PER PT AND CINDY PLEASE KEEP ARRIVAL TIME AFTER 8AM    CHOLECYSTECTOMY     CYSTOSCOPY W/ URETERAL STENT PLACEMENT Right 05/29/2023   Procedure: CYSTOSCOPY WITH RETROGRADE PYELOGRAM/URETERAL STENT PLACEMENT;  Surgeon: Malen Gauze, MD;  Location: [facility];  Service: Urology;  Laterality: Right;   HERNIA REPAIR     INSERTION OF DIALYSIS CATHETER Right 01/22/2021   Procedure: INSERTION OF DIALYSIS CATHETER;  Surgeon: Lucretia Roers, MD;  Location: [facility];  Service: General;  Laterality: Right;   INSERTION OF DIALYSIS CATHETER Right 04/16/2021   Procedure: INSERTION OF DIALYSIS CATHETER;  Surgeon: Lucretia Roers, MD;  Location: [facility];  Service: General;  Laterality: Right;   PROSTATECTOMY  2011   Family History  Problem Relation Age of Onset   Diabetes type II Mother    Heart attack Father    Social History   Socioeconomic History   Marital status: Married    Spouse name: Darlene   Number of children: 3   Years of education: Not on file   Highest education level: Not on file  Occupational History   Not on file  Tobacco Use   Smoking status: Never   Smokeless tobacco: Never  Vaping Use   Vaping status: Never  Used  Substance and Sexual Activity   Alcohol use: No   Drug use: No   Sexual activity: Yes  Other Topics Concern   Not on file  Social History Narrative   3 children, 1 daughter and 1 son sorry deceased. 1 daughter living in [place] and 1 step child   Married x 13 years 08/25/21.   7 grandchildren   1 great grandchild   Social Determinants of Health   Financial Resource Strain: Low Risk  (08/22/2022)   Overall Financial Resource Strain (CARDIA)    Difficulty of Paying Living Expenses: Not hard at all  Food Insecurity: No Food Insecurity (06/05/2023)   Hunger Vital Sign    Worried About Running Out of Food in the Last Year: Never true    Ran Out of Food in the Last Year: Never true  Transportation Needs: No Transportation Needs (06/05/2023)   PRAPARE - Administrator, Civil Service (Medical): No    Lack of Transportation (  Non-Medical): No  Physical Activity: Inactive (08/22/2022)   Exercise Vital Sign    Days of Exercise per Week: 0 days    Minutes of Exercise per Session: 0 min  Stress: No Stress Concern Present (08/22/2022)   [employer] of Occupational Health - Occupational Stress Questionnaire    Feeling of Stress : Not at all  Social Connections: Moderately Isolated (08/22/2022)   Social Connection and Isolation Panel [NHANES]    Frequency of Communication with Friends and Family: More than three times a week    Frequency of Social Gatherings with Friends and Family: Three times a week    Attends Religious Services: Never    Active Member of Clubs or Organizations: No    Attends Banker Meetings: Never    Marital Status: Married  Catering manager Violence: Not At Risk (06/05/2023)   Humiliation, Afraid, Rape, and Kick questionnaire    Fear of Current or Ex-Partner: No    Emotionally Abused: No    Physically Abused: No    Sexually Abused: No    SUBJECTIVE  Review of Systems Constitutional: Patient denies any unintentional weight loss  or change in strength lntegumentary: Patient denies any rashes or pruritus Cardiovascular: Patient denies chest pain or syncope Respiratory: Patient denies shortness of breath Gastrointestinal: Patient denies nausea, vomiting, constipation, or diarrhea Musculoskeletal: Patient denies muscle cramps or weakness Neurologic: Patient denies convulsions or seizures Allergic/Immunologic: Patient denies recent allergic reaction(s) Hematologic/Lymphatic: Patient denies bleeding tendencies Endocrine: Patient denies heat/cold intolerance  GU: As per HPI.  OBJECTIVE Vitals:   06/10/23 1359  BP: 128/77  Pulse: (!) 118  Temp: 98.6 F (37 C)   There is no height or weight on file to calculate BMI.  Physical Examination Constitutional: No obvious distress; patient is non-toxic appearing  Cardiovascular: No visible lower extremity edema.  Respiratory: The patient does not have audible wheezing/stridor; respirations do not appear labored  Gastrointestinal: Abdomen non-distended Musculoskeletal: Normal ROM of UEs  Skin: No obvious rashes/open sores  Neurologic: CN 2-12 grossly intact Psychiatric: Answered questions appropriately with normal affect  Hematologic/Lymphatic/Immunologic: No obvious bruises or sites of spontaneous bleeding  UA: unable to void during today's visit  ASSESSMENT Right ureteral stone - Plan: DG Abd 1 View, Ambulatory Referral For Surgery Scheduling  Hydronephrosis of right kidney - Plan: DG Abd 1 View, Ambulatory Referral For Surgery Scheduling  Ureteral stent present - Plan: DG Abd 1 View, Ambulatory Referral For Surgery Scheduling  History of sepsis - Plan: DG Abd 1 View, Ambulatory Referral For Surgery Scheduling  Hospital discharge follow-up - Plan: DG Abd 1 View  We reviewed recent history including hospital admission and his operative procedures/findings. Unfortunately he and his wife had somehow been given the impression during his hospital stay that the  ureteral stone and stent had been removed at some point. Patient dismayed to learn that another procedure will be necessary for stone extraction. Discussed option to start Flomax for medical expulsive therapy however we agreed to hold off on that due to his ESRD. KUB ordered to assess for potential intermittent stone passage, however he was advised that right ureteroscopic stone manipulation is the likely next step. Surgery request submitted today for that with Dr. Ronne Binning, who will be notified.   Patient agreed to take urine specimen cup home; he will collect urine specimen and drop off at lab later this week to confirm resolution of UTI.   Pt verbalized understanding and agreement. All questions were answered.  PLAN Advised the following: 1.  Urine specimen drop off. 2. KUB.  3. Surgery request submitted for right ureteroscopic stone manipulation with Dr. Ronne Binning.    Orders Placed This Encounter  Procedures   DG Abd 1 View    Standing Status:   Future    Standing Expiration Date:   06/09/2024    Order Specific Question:   Reason for Exam (SYMPTOM  OR DIAGNOSIS REQUIRED)    Answer:   kidney stone    Order Specific Question:   Preferred imaging location?    Answer:   [facility]   Ambulatory Referral For Surgery Scheduling    Referral Priority:   Routine    Referral Type:   Consultation    Number of Visits Requested:   1    It has been explained that the patient is to follow regularly with their PCP in addition to all other providers involved in their care and to follow instructions provided by these respective offices. Patient advised to contact urology clinic if any urologic-pertaining questions, concerns, new symptoms or problems arise in the interim period.  There are no Patient Instructions on file for this visit.  Electronically signed by:  Donnita Falls, MSN, FNP-C, CUNP 06/10/2023 5:07 PM

## 2023-06-10 ENCOUNTER — Ambulatory Visit: Payer: PPO | Admitting: Urology

## 2023-06-10 ENCOUNTER — Encounter: Payer: Self-pay | Admitting: Urology

## 2023-06-10 VITALS — BP 128/77 | HR 118 | Temp 98.6°F

## 2023-06-10 DIAGNOSIS — N201 Calculus of ureter: Secondary | ICD-10-CM

## 2023-06-10 DIAGNOSIS — Z8619 Personal history of other infectious and parasitic diseases: Secondary | ICD-10-CM | POA: Diagnosis not present

## 2023-06-10 DIAGNOSIS — Z09 Encounter for follow-up examination after completed treatment for conditions other than malignant neoplasm: Secondary | ICD-10-CM

## 2023-06-10 DIAGNOSIS — Z960 Presence of urogenital implants: Secondary | ICD-10-CM

## 2023-06-10 DIAGNOSIS — N1330 Unspecified hydronephrosis: Secondary | ICD-10-CM

## 2023-06-11 ENCOUNTER — Telehealth: Payer: Self-pay | Admitting: *Deleted

## 2023-06-11 ENCOUNTER — Other Ambulatory Visit: Payer: Self-pay

## 2023-06-11 DIAGNOSIS — R310 Gross hematuria: Secondary | ICD-10-CM | POA: Diagnosis not present

## 2023-06-11 NOTE — Telephone Encounter (Signed)
   Name: Mark LACUESTA Sr.  DOB: 1940-08-11  MRN: [id number]  Primary Cardiologist: Nona Dell, MD  Chart reviewed as part of pre-operative protocol coverage. Because of Mark L Stoffers Sr.'s past medical history and time since last visit, he will require a follow-up in-office visit in order to better assess preoperative cardiovascular risk.  Pre-op covering staff: - Please schedule appointment and call patient to inform them. If patient already had an upcoming appointment within acceptable timeframe, please add "pre-op clearance" to the appointment notes so provider is aware. - Please contact requesting surgeon's office via preferred method (i.e, phone, fax) to inform them of need for appointment prior to surgery.    Napoleon Form, Leodis Rains, NP  06/11/2023, 6:01 PM

## 2023-06-11 NOTE — Telephone Encounter (Signed)
   Pre-operative Risk Assessment    Patient Name: Mark REEP Sr.  DOB: 15-Feb-1941 MRN: [id number]  DATE OF LAST VISIT: NONE DATE OF NEXT VISIT: NONE; PT WILL NEED NEW PT APPT     Request for Surgical Clearance    Procedure:   CYSTOSCOPY WITH URETEROSCOPY  Date of Surgery:  Clearance TBD                                 Surgeon:  DR. PATRICK McKENZIE Surgeon's Group or Practice Name:  [facility] [place]  Phone number:  [phone] Fax number:  [phone]   Type of Clearance Requested:   - Medical ; NONE INDICATED   Type of Anesthesia:  General    Additional requests/questions:    Elpidio Anis   06/11/2023, 5:38 PM

## 2023-06-12 ENCOUNTER — Ambulatory Visit (HOSPITAL_COMMUNITY)
Admission: RE | Admit: 2023-06-12 | Discharge: 2023-06-12 | Disposition: A | Discharge status: Home / Self Care | Payer: PPO | Source: Ambulatory Visit | Attending: Urology | Admitting: Urology

## 2023-06-12 ENCOUNTER — Ambulatory Visit: Payer: PPO | Admitting: Student in an Organized Health Care Education/Training Program

## 2023-06-12 DIAGNOSIS — N200 Calculus of kidney: Secondary | ICD-10-CM | POA: Diagnosis not present

## 2023-06-12 DIAGNOSIS — Z960 Presence of urogenital implants: Secondary | ICD-10-CM | POA: Insufficient documentation

## 2023-06-12 DIAGNOSIS — N1330 Unspecified hydronephrosis: Secondary | ICD-10-CM | POA: Insufficient documentation

## 2023-06-12 DIAGNOSIS — N201 Calculus of ureter: Secondary | ICD-10-CM | POA: Insufficient documentation

## 2023-06-12 DIAGNOSIS — Z09 Encounter for follow-up examination after completed treatment for conditions other than malignant neoplasm: Secondary | ICD-10-CM | POA: Diagnosis not present

## 2023-06-12 DIAGNOSIS — Z8619 Personal history of other infectious and parasitic diseases: Secondary | ICD-10-CM | POA: Diagnosis not present

## 2023-06-12 NOTE — Telephone Encounter (Signed)
Patient scheduled to see Sharlene Dory, NP on 07/10/23 for preop clearance.

## 2023-06-13 LAB — URINE CULTURE

## 2023-06-16 DIAGNOSIS — Z992 Dependence on renal dialysis: Secondary | ICD-10-CM | POA: Diagnosis not present

## 2023-06-16 DIAGNOSIS — N186 End stage renal disease: Secondary | ICD-10-CM | POA: Diagnosis not present

## 2023-06-16 DIAGNOSIS — E876 Hypokalemia: Secondary | ICD-10-CM | POA: Diagnosis not present

## 2023-06-16 DIAGNOSIS — D689 Coagulation defect, unspecified: Secondary | ICD-10-CM | POA: Diagnosis not present

## 2023-06-16 DIAGNOSIS — N2581 Secondary hyperparathyroidism of renal origin: Secondary | ICD-10-CM | POA: Diagnosis not present

## 2023-06-16 DIAGNOSIS — D509 Iron deficiency anemia, unspecified: Secondary | ICD-10-CM | POA: Diagnosis not present

## 2023-06-17 ENCOUNTER — Ambulatory Visit: Payer: PPO | Admitting: Urology

## 2023-06-23 DIAGNOSIS — N2581 Secondary hyperparathyroidism of renal origin: Secondary | ICD-10-CM | POA: Diagnosis not present

## 2023-06-23 DIAGNOSIS — N186 End stage renal disease: Secondary | ICD-10-CM | POA: Diagnosis not present

## 2023-06-23 DIAGNOSIS — Z992 Dependence on renal dialysis: Secondary | ICD-10-CM | POA: Diagnosis not present

## 2023-06-23 DIAGNOSIS — D689 Coagulation defect, unspecified: Secondary | ICD-10-CM | POA: Diagnosis not present

## 2023-06-23 DIAGNOSIS — D509 Iron deficiency anemia, unspecified: Secondary | ICD-10-CM | POA: Diagnosis not present

## 2023-06-29 DIAGNOSIS — N186 End stage renal disease: Secondary | ICD-10-CM | POA: Diagnosis not present

## 2023-06-29 DIAGNOSIS — E1122 Type 2 diabetes mellitus with diabetic chronic kidney disease: Secondary | ICD-10-CM | POA: Diagnosis not present

## 2023-06-29 DIAGNOSIS — E039 Hypothyroidism, unspecified: Secondary | ICD-10-CM | POA: Diagnosis not present

## 2023-06-29 DIAGNOSIS — Z992 Dependence on renal dialysis: Secondary | ICD-10-CM | POA: Diagnosis not present

## 2023-06-29 DIAGNOSIS — D509 Iron deficiency anemia, unspecified: Secondary | ICD-10-CM | POA: Diagnosis not present

## 2023-06-29 DIAGNOSIS — D689 Coagulation defect, unspecified: Secondary | ICD-10-CM | POA: Diagnosis not present

## 2023-06-29 DIAGNOSIS — N2581 Secondary hyperparathyroidism of renal origin: Secondary | ICD-10-CM | POA: Diagnosis not present

## 2023-07-01 ENCOUNTER — Encounter: Payer: PPO | Admitting: Student in an Organized Health Care Education/Training Program

## 2023-07-01 NOTE — H&P (Signed)
Chief Complaint:  Decreased flows in a dialysis access  Assessment/Plan: ESRD - last dialysis was on on Tuesday 11/26 for a 4hr treatment; no absolute indication for emergent dialysis and may continue outpatient regimen at his center. He did have a skin tear that occurred end of treatment on Tuesday that has a slow ooze from the skin tear lateral to the graft. Decreased flows in a permanent dialysis access - on for an angiogram and possible angioplasty. Renal osteodystrophy - continue binders. Anemia - per center protocol. HTN - continue home regimen. HFpEF - compensated H/o nephrolithiasis recently treated for an infected right sided stone.   HPI: Mark PERILLO Sr. is an 82 y.o. male with ESRD on HD, HTN, HLD, DM II, CASHD, HFpEF, nephrolithiasis referred for decreased flows. He was recently admitted for hypotension with a history of  hematuria and pneumaturia (referred previously to urology). He recently was diagnosed with a R infected stone.   ROS Pertinent items are noted in HPI.  Chemistry and CBC: Creat  Date/Time Value Ref Range Status  02/11/2023 08:43 AM 4.24 (H) 0.70 - 1.22 mg/dL Final  16/05/9603 54:09 PM 5.00 (H) 0.70 - 1.22 mg/dL Final  81/19/1478 29:56 PM 3.16 (H) 0.70 - 1.22 mg/dL Final  21/30/8657 84:69 AM 2.81 (H) 0.70 - 1.28 mg/dL Final  62/95/2841 32:44 PM 3.11 (H) 0.70 - 1.28 mg/dL Final  08/07/7251 66:44 AM 1.77 (H) 0.70 - 1.18 mg/dL Final    Comment:    For patients >62 years of age, the reference limit for Creatinine is approximately 13% higher for people identified as African-American. Marland Kitchen   12/23/2019 12:25 PM 2.31 (H) 0.70 - 1.18 mg/dL Final    Comment:    For patients >51 years of age, the reference limit for Creatinine is approximately 13% higher for people identified as African-American. .   09/17/2019 11:58 AM 1.99 (H) 0.70 - 1.18 mg/dL Final    Comment:    For patients >93 years of age, the reference limit for Creatinine is approximately 13%  higher for people identified as African-American. .   07/20/2019 09:38 AM 1.85 (H) 0.70 - 1.18 mg/dL Final    Comment:    For patients >51 years of age, the reference limit for Creatinine is approximately 13% higher for people identified as African-American. Marland Kitchen   04/20/2019 11:26 AM 1.85 (H) 0.70 - 1.18 mg/dL Final    Comment:    For patients >5 years of age, the reference limit for Creatinine is approximately 13% higher for people identified as African-American. Marland Kitchen   04/09/2019 12:49 PM 1.82 (H) 0.70 - 1.18 mg/dL Final    Comment:    For patients >17 years of age, the reference limit for Creatinine is approximately 13% higher for people identified as African-American. Marland Kitchen   04/01/2019 12:23 PM 1.73 (H) 0.70 - 1.18 mg/dL Final    Comment:    For patients >36 years of age, the reference limit for Creatinine is approximately 13% higher for people identified as African-American. Marland Kitchen   03/11/2019 11:39 AM 1.47 (H) 0.70 - 1.18 mg/dL Final    Comment:    For patients >42 years of age, the reference limit for Creatinine is approximately 13% higher for people identified as African-American. Marland Kitchen   03/04/2019 11:00 AM 1.75 (H) 0.70 - 1.18 mg/dL Final    Comment:    For patients >15 years of age, the reference limit for Creatinine is approximately 13% higher for people identified as African-American. .   02/23/2019 11:09  AM 1.40 (H) 0.70 - 1.18 mg/dL Final    Comment:    For patients >54 years of age, the reference limit for Creatinine is approximately 13% higher for people identified as African-American. Marland Kitchen   08/11/2018 01:04 PM 1.38 (H) 0.70 - 1.18 mg/dL Final    Comment:    For patients >69 years of age, the reference limit for Creatinine is approximately 13% higher for people identified as African-American. Marland Kitchen   12/09/2017 12:21 PM 1.27 (H) 0.70 - 1.18 mg/dL Final    Comment:    For patients >50 years of age, the reference limit for Creatinine is approximately 13%  higher for people identified as African-American. Marland Kitchen   11/25/2017 12:13 PM 1.31 (H) 0.70 - 1.18 mg/dL Final    Comment:    For patients >56 years of age, the reference limit for Creatinine is approximately 13% higher for people identified as African-American. .   05/27/2016 02:21 PM 1.29 (H) 0.70 - 1.18 mg/dL Final    Comment:      For patients > or = 82 years of age: The upper reference limit for Creatinine is approximately 13% higher for people identified as African-American.     12/28/2015 11:58 AM 1.57 (H) 0.70 - 1.18 mg/dL Final  62/95/2841 32:44 AM 1.22 (H) 0.70 - 1.18 mg/dL Final  08/07/7251 66:44 AM 1.35 (H) 0.70 - 1.18 mg/dL Final  03/47/4259 56:38 AM 1.28 (H) 0.70 - 1.18 mg/dL Final  75/64/3329 51:88 PM 1.36 (H) 0.70 - 1.18 mg/dL Final  41/66/0630 16:01 PM 1.35 0.50 - 1.35 mg/dL Final  09/32/3557 32:20 PM 1.41 (H) 0.50 - 1.35 mg/dL Final  25/42/7062 37:62 PM 1.84 (H) 0.50 - 1.35 mg/dL Final  83/15/1761 60:73 AM 1.62 (H) 0.50 - 1.35 mg/dL Final  71/01/2693 85:46 PM 1.65 (H) 0.50 - 1.35 mg/dL Final  27/10/5007 38:18 AM 1.33 0.50 - 1.35 mg/dL Final  29/93/7169 67:89 AM 1.77 (H) 0.50 - 1.35 mg/dL Final  38/05/1750 02:58 AM 1.57 (H) 0.50 - 1.35 mg/dL Final  52/77/8242 35:36 PM 1.48 (H) 0.50 - 1.35 mg/dL Final  14/43/1540 08:67 PM 1.57 (H) 0.50 - 1.35 mg/dL Final  61/95/0932 67:12 AM 1.77 (H) 0.50 - 1.35 mg/dL Final  45/80/9983 38:25 PM 1.42 (H) 0.50 - 1.35 mg/dL Final  05/39/7673 41:93 AM 1.50 (H) 0.50 - 1.35 mg/dL Final   Creatinine, Ser  Date/Time Value Ref Range Status  06/02/2023 05:22 AM 4.46 (H) 0.61 - 1.24 mg/dL Final  79/09/4095 35:32 AM 3.59 (H) 0.61 - 1.24 mg/dL Final  99/24/2683 41:96 AM 3.08 (H) 0.61 - 1.24 mg/dL Final  22/29/7989 21:19 AM 2.76 (H) 0.61 - 1.24 mg/dL Final  41/74/0814 48:18 PM 2.19 (H) 0.61 - 1.24 mg/dL Final  56/31/4970 26:37 AM 3.00 (H) 0.61 - 1.24 mg/dL Final  85/88/5027 74:12 AM 3.37 (H) 0.61 - 1.24 mg/dL Final  87/86/7672 09:47 AM  3.44 (H) 0.61 - 1.24 mg/dL Final  09/62/8366 29:47 AM 2.70 (H) 0.61 - 1.24 mg/dL Final    Comment:    DELTA CHECK NOTED  04/16/2021 05:27 AM 4.24 (H) 0.61 - 1.24 mg/dL Final  65/46/5035 46:56 AM 3.46 (H) 0.61 - 1.24 mg/dL Final  81/27/5170 01:74 AM 5.20 (H) 0.61 - 1.24 mg/dL Final  94/49/6759 16:38 AM 5.30 (H) 0.61 - 1.24 mg/dL Final  46/65/9935 70:17 PM 5.25 (H) 0.61 - 1.24 mg/dL Final  79/39/0300 92:33 AM 3.03 (H) 0.61 - 1.24 mg/dL Final  00/76/2263 33:54 AM 3.10 (H) 0.61 - 1.24 mg/dL Final  56/25/6389  06:15 AM 2.96 (H) 0.61 - 1.24 mg/dL Final  09/32/3557 32:20 AM 2.85 (H) 0.61 - 1.24 mg/dL Final  25/42/7062 37:62 AM 2.65 (H) 0.61 - 1.24 mg/dL Final  83/15/1761 60:73 PM 2.49 (H) 0.61 - 1.24 mg/dL Final  71/01/2693 85:46 AM 2.07 (H) 0.61 - 1.24 mg/dL Final    Comment:    DELTA CHECK NOTED  01/24/2021 06:06 AM 3.61 (H) 0.61 - 1.24 mg/dL Final  27/10/5007 38:18 AM 2.75 (H) 0.61 - 1.24 mg/dL Final  29/93/7169 67:89 AM 4.27 (H) 0.61 - 1.24 mg/dL Final  38/05/1750 02:58 AM 4.53 (H) 0.61 - 1.24 mg/dL Final  52/77/8242 35:36 AM 4.71 (H) 0.61 - 1.24 mg/dL Final  14/43/1540 08:67 AM 4.94 (H) 0.61 - 1.24 mg/dL Final  61/95/0932 67:12 AM 4.90 (H) 0.61 - 1.24 mg/dL Final  45/80/9983 38:25 AM 4.80 (H) 0.61 - 1.24 mg/dL Final  05/39/7673 41:93 AM 4.94 (H) 0.61 - 1.24 mg/dL Final  79/09/4095 35:32 AM 5.03 (H) 0.61 - 1.24 mg/dL Final  99/24/2683 41:96 AM 4.67 (H) 0.61 - 1.24 mg/dL Final  22/29/7989 21:19 AM 4.75 (H) 0.61 - 1.24 mg/dL Final  41/74/0814 48:18 AM 4.37 (H) 0.61 - 1.24 mg/dL Final  56/31/4970 26:37 AM 4.31 (H) 0.61 - 1.24 mg/dL Final  85/88/5027 74:12 AM 4.12 (H) 0.61 - 1.24 mg/dL Final  87/86/7672 09:47 AM 3.56 (H) 0.61 - 1.24 mg/dL Final  09/62/8366 29:47 AM 4.69 (H) 0.61 - 1.24 mg/dL Final  65/46/5035 46:56 AM 4.77 (H) 0.61 - 1.24 mg/dL Final  81/27/5170 01:74 AM 4.87 (H) 0.61 - 1.24 mg/dL Final  94/49/6759 16:38 AM 4.88 (H) 0.61 - 1.24 mg/dL Final  46/65/9935 70:17 AM 4.64 (H)  0.61 - 1.24 mg/dL Final  79/39/0300 92:33 AM 4.41 (H) 0.61 - 1.24 mg/dL Final  00/76/2263 33:54 AM 4.27 (H) 0.61 - 1.24 mg/dL Final  56/25/6389 37:34 AM 4.00 (H) 0.61 - 1.24 mg/dL Final  28/76/8115 72:62 AM 3.54 (H) 0.61 - 1.24 mg/dL Final  03/55/9741 63:84 AM 4.49 (H) 0.61 - 1.24 mg/dL Final  53/64/6803 21:22 AM 5.92 (H) 0.61 - 1.24 mg/dL Final  48/25/0037 04:88 AM 5.89 (H) 0.61 - 1.24 mg/dL Final  89/16/9450 38:88 AM 5.77 (H) 0.61 - 1.24 mg/dL Final  28/00/3491 79:15 AM 5.89 (H) 0.61 - 1.24 mg/dL Final  05/69/7948 01:65 AM 5.88 (H) 0.61 - 1.24 mg/dL Final   No results for input(s): "NA", "K", "CL", "CO2", "GLUCOSE", "BUN", "CREATININE", "CALCIUM", "PHOS" in the last 168 hours.  Invalid input(s): "ALB" No results for input(s): "WBC", "NEUTROABS", "HGB", "HCT", "MCV", "PLT" in the last 168 hours. Liver Function Tests: No results for input(s): "AST", "ALT", "ALKPHOS", "BILITOT", "PROT", "ALBUMIN" in the last 168 hours. No results for input(s): "LIPASE", "AMYLASE" in the last 168 hours. No results for input(s): "AMMONIA" in the last 168 hours. Cardiac Enzymes: No results for input(s): "CKTOTAL", "CKMB", "CKMBINDEX", "TROPONINI" in the last 168 hours. Iron Studies: No results for input(s): "IRON", "TIBC", "TRANSFERRIN", "FERRITIN" in the last 72 hours. PT/INR: @LABRCNTIP (inr:5)  Xrays/Other Studies: )No results found for this or any previous visit (from the past 48 hour(s)). No results found.  PMH:   Past Medical History:  Diagnosis Date   Arthritis    lower back   Chronic back pain    Disc disease   Chronic kidney disease    Coronary atherosclerosis of native coronary artery    Coronary calcifications by chest CT, Myoview demonstrating inferolateral scar   Deafness in right ear    Diabetes mellitus  Diabetic retinopathy (HCC)    moderate nonproliferative with macular edema in right eye   Diastolic dysfunction 12/2011   Grade 1.   Essential hypertension, benign    Heart  murmur    in past   HOH (hard of hearing)    Hyperkalemia    Kidney stones    Mixed hyperlipidemia    NAFLD (nonalcoholic fatty liver disease)    Pancreatic insufficiency    Diagnosed at [facility]   Prostate cancer [facility]) 2011   Shortness of breath dyspnea    occasional - liver pressing on right lung, decreased capacity   Subdural hematoma (HCC)    Type 2 diabetes mellitus (HCC)    Wears hearing aid    left    PSH:   Past Surgical History:  Procedure Laterality Date   APPENDECTOMY     AV FISTULA PLACEMENT Left 05/15/2021   Procedure: INSERTION OF LEFT ARM ARTERIOVENOUS (AV) GORE-TEX GRAFT;  Surgeon: Larina Earthly, MD;  Location: [facility];  Service: Vascular;  Laterality: Left;   Bilateral hip replacement     CATARACT EXTRACTION W/PHACO Left 05/15/2015   Procedure: CATARACT EXTRACTION PHACO AND INTRAOCULAR LENS PLACEMENT (IOC);  Surgeon: Sherald Hess, MD;  Location: [facility];  Service: Ophthalmology;  Laterality: Left;  DIABETIC - insulin pump and oral meds   CATARACT EXTRACTION W/PHACO Right 08/28/2015   Procedure: CATARACT EXTRACTION PHACO AND INTRAOCULAR LENS PLACEMENT (IOC);  Surgeon: Sherald Hess, MD;  Location: [facility];  Service: Ophthalmology;  Laterality: Right;  DIABETIC PER PT AND CINDY PLEASE KEEP ARRIVAL TIME AFTER 8AM    CHOLECYSTECTOMY     CYSTOSCOPY W/ URETERAL STENT PLACEMENT Right 05/29/2023   Procedure: CYSTOSCOPY WITH RETROGRADE PYELOGRAM/URETERAL STENT PLACEMENT;  Surgeon: Malen Gauze, MD;  Location: [facility];  Service: Urology;  Laterality: Right;   HERNIA REPAIR     INSERTION OF DIALYSIS CATHETER Right 01/22/2021   Procedure: INSERTION OF DIALYSIS CATHETER;  Surgeon: Lucretia Roers, MD;  Location: [facility];  Service: General;  Laterality: Right;   INSERTION OF DIALYSIS CATHETER Right 04/16/2021   Procedure: INSERTION OF DIALYSIS CATHETER;  Surgeon: Lucretia Roers, MD;  Location: [facility];  Service: General;   Laterality: Right;   PROSTATECTOMY  2011    Allergies:  Allergies  Allergen Reactions   Enalapril Maleate Cough    Medications:   Prior to Admission medications   Medication Sig Start Date End Date Taking? Authorizing Provider  cinacalcet (SENSIPAR) 30 MG tablet SMARTSIG:1 Tablet(s) By Mouth Every Evening Patient not taking: Reported on 05/28/2023 09/04/21   [provider]  Continuous Glucose Sensor (FREESTYLE LIBRE 3 PLUS SENSOR) MISC Change sensor every 15 days. 06/02/23   Donita Brooks, MD  diazepam (VALIUM) 2 MG tablet TAKE 1 TABLET BY MOUTH EVERY NIGHT AT BEDTIME AS NEEDED FOR INSOMNIA 04/11/23   Donita Brooks, MD  diltiazem (CARDIZEM CD) 240 MG 24 hr capsule TAKE 1 CAPSULE(240 MG) BY MOUTH DAILY 09/30/22   Donita Brooks, MD  HYDROcodone-acetaminophen (NORCO/VICODIN) 5-325 MG tablet Take 1 tablet by mouth every 12 (twelve) hours as needed for severe pain. Must last 30 days. 06/10/23 07/10/23  Edward Jolly, MD  insulin aspart (NOVOLOG) 100 UNIT/ML injection Inject 8 Units into the skin 3 (three) times daily with meals. Patient taking differently: Inject 2 Units into the skin in the morning. Takes on dialysis days 02/06/21   Maurilio Lovely D, DO  insulin glargine (LANTUS) 100 UNIT/ML injection Inject 0.12 mLs (  12 Units total) into the skin at bedtime. Patient taking differently: Inject 6 Units into the skin in the morning. Takes on days he doesn't have dialysis 03/20/21   Donita Brooks, MD  lidocaine-prilocaine (EMLA) cream APPLY SMALL AMOUNT TO ACCESS SITE (AVF) 30 MINUTES BEFORE DIALYSIS. COVER WITH OCCLUSIVE DRESSING (SARAN WRAP) 04/21/23   Donita Brooks, MD  pravastatin (PRAVACHOL) 10 MG tablet TAKE 1 TABLET(10 MG) BY MOUTH DAILY WITH BREAKFAST 03/14/23   Donita Brooks, MD  sevelamer carbonate (RENVELA) 800 MG tablet Take 800 mg by mouth 3 (three) times daily with meals. 05/09/23   [provider]  VELPHORO 500 MG chewable tablet Chew 500 mg by mouth 3  (three) times daily with meals. Patient not taking: Reported on 05/28/2023 05/23/23   [provider]    Discontinued Meds:  There are no discontinued medications.  Social History:  reports that he has never smoked. He has never used smokeless tobacco. He reports that he does not drink alcohol and does not use drugs.  Family History:   Family History  Problem Relation Age of Onset   Diabetes type II Mother    Heart attack Father     There were no vitals taken for this visit. GEN older gentleman, NAD HEENT EOMI  NECK no JVD PULM clear CV irregular irregular ABD soft EXT no LE edema NEURO Aao x 3 SKIN thinned, some skin tears and burising ACCESS: l avg, strong bruit, skin tear with slow ooze at the edges. Skin tear lateral to the access.       Ethelene Hal, MD 07/01/2023, 11:48 AM

## 2023-07-02 ENCOUNTER — Ambulatory Visit (HOSPITAL_COMMUNITY)
Admission: RE | Admit: 2023-07-02 | Discharge: 2023-07-02 | Disposition: A | Discharge status: Home / Self Care | Payer: PPO | Attending: Nephrology | Admitting: Nephrology

## 2023-07-02 ENCOUNTER — Other Ambulatory Visit: Payer: Self-pay

## 2023-07-02 ENCOUNTER — Ambulatory Visit: Payer: PPO | Admitting: Nurse Practitioner

## 2023-07-02 ENCOUNTER — Encounter (HOSPITAL_COMMUNITY)
Admission: RE | Disposition: A | Discharge status: Home / Self Care | Payer: Self-pay | Source: Home / Self Care | Attending: Nephrology

## 2023-07-02 ENCOUNTER — Encounter (HOSPITAL_COMMUNITY): Payer: Self-pay | Admitting: Vascular Surgery

## 2023-07-02 DIAGNOSIS — I132 Hypertensive heart and chronic kidney disease with heart failure and with stage 5 chronic kidney disease, or end stage renal disease: Secondary | ICD-10-CM | POA: Diagnosis not present

## 2023-07-02 DIAGNOSIS — N186 End stage renal disease: Secondary | ICD-10-CM | POA: Insufficient documentation

## 2023-07-02 DIAGNOSIS — T82858A Stenosis of vascular prosthetic devices, implants and grafts, initial encounter: Secondary | ICD-10-CM | POA: Insufficient documentation

## 2023-07-02 DIAGNOSIS — Z992 Dependence on renal dialysis: Secondary | ICD-10-CM | POA: Insufficient documentation

## 2023-07-02 DIAGNOSIS — Z87442 Personal history of urinary calculi: Secondary | ICD-10-CM | POA: Diagnosis not present

## 2023-07-02 DIAGNOSIS — Z794 Long term (current) use of insulin: Secondary | ICD-10-CM | POA: Insufficient documentation

## 2023-07-02 DIAGNOSIS — I5030 Unspecified diastolic (congestive) heart failure: Secondary | ICD-10-CM | POA: Insufficient documentation

## 2023-07-02 DIAGNOSIS — Y832 Surgical operation with anastomosis, bypass or graft as the cause of abnormal reaction of the patient, or of later complication, without mention of misadventure at the time of the procedure: Secondary | ICD-10-CM | POA: Diagnosis not present

## 2023-07-02 DIAGNOSIS — E1122 Type 2 diabetes mellitus with diabetic chronic kidney disease: Secondary | ICD-10-CM | POA: Diagnosis not present

## 2023-07-02 DIAGNOSIS — N250 Renal osteodystrophy: Secondary | ICD-10-CM | POA: Diagnosis not present

## 2023-07-02 HISTORY — PX: A/V FISTULAGRAM: CATH118298

## 2023-07-02 HISTORY — PX: PERIPHERAL VASCULAR BALLOON ANGIOPLASTY: CATH118281

## 2023-07-02 LAB — GLUCOSE, CAPILLARY: Glucose-Capillary: 193 mg/dL — ABNORMAL HIGH (ref 70–99)

## 2023-07-02 SURGERY — A/V FISTULAGRAM
Anesthesia: LOCAL | Laterality: Left

## 2023-07-02 MED ORDER — ONDANSETRON HCL 4 MG/2ML IJ SOLN: 4.0000 mg | Freq: Four times a day (QID) | INTRAMUSCULAR | Status: DC | PRN

## 2023-07-02 MED ORDER — SODIUM CHLORIDE 0.9% FLUSH: 10.0000 mL | Freq: Two times a day (BID) | INTRAVENOUS | Status: DC

## 2023-07-02 MED ORDER — MIDAZOLAM HCL 2 MG/2ML IJ SOLN
INTRAMUSCULAR | Status: AC
Start: 2023-07-02 — End: ?
  Filled 2023-07-02: qty 2

## 2023-07-02 MED ORDER — FENTANYL CITRATE (PF) 100 MCG/2ML IJ SOLN
INTRAMUSCULAR | Status: AC
  Filled 2023-07-02: qty 2

## 2023-07-02 MED ORDER — LIDOCAINE HCL (PF) 1 % IJ SOLN
INTRAMUSCULAR | Status: DC | PRN
  Administered 2023-07-02: 3 mL

## 2023-07-02 MED ORDER — FENTANYL CITRATE (PF) 100 MCG/2ML IJ SOLN
INTRAMUSCULAR | Status: DC | PRN
  Administered 2023-07-02: 25 ug via INTRAVENOUS

## 2023-07-02 MED ORDER — SODIUM CHLORIDE 0.9% FLUSH
3.0000 mL | INTRAVENOUS | Status: DC | PRN
Start: 2023-07-02 — End: 2023-07-02

## 2023-07-02 MED ORDER — IODIXANOL 320 MG/ML IV SOLN
INTRAVENOUS | Status: DC | PRN
  Administered 2023-07-02: 20 mL

## 2023-07-02 MED ORDER — HEPARIN (PORCINE) IN NACL 1000-0.9 UT/500ML-% IV SOLN
INTRAVENOUS | Status: DC | PRN
  Administered 2023-07-02: 500 mL

## 2023-07-02 MED ORDER — ACETAMINOPHEN 325 MG PO TABS
650.0000 mg | ORAL_TABLET | ORAL | Status: DC | PRN
Start: 2023-07-02 — End: 2023-07-02

## 2023-07-02 MED ORDER — MIDAZOLAM HCL 2 MG/2ML IJ SOLN
INTRAMUSCULAR | Status: DC | PRN
  Administered 2023-07-02: 1 mg via INTRAVENOUS

## 2023-07-02 MED ORDER — LIDOCAINE HCL (PF) 1 % IJ SOLN
INTRAMUSCULAR | Status: AC
  Filled 2023-07-02: qty 30

## 2023-07-02 SURGICAL SUPPLY — 7 items
BALLN MUSTANG 8.0X40 75 (BALLOONS) ×2 IMPLANT
BALLOON MUSTANG 8.0X40 75 (BALLOONS) IMPLANT
COVER DOME SNAP 22 D (MISCELLANEOUS) ×2 IMPLANT
SHEATH PINNACLE R/O II 6F 4CM (SHEATH) IMPLANT
SYR MEDALLION 10ML (SYRINGE) IMPLANT
TRAY PV CATH (CUSTOM PROCEDURE TRAY) ×2 IMPLANT
WIRE STARTER BENTSON 035X150 (WIRE) IMPLANT

## 2023-07-02 NOTE — Hospital Course (Signed)
Interval H&P  The patient has presented today for an angiogram/ angioplasty given decreasing flows.   Various methods of treatment have been discussed with the patient.  After consideration of risk, benefits and other options for treatment, the patient has consented to an angiogram/ angioplasty/ possible stent placement.   Risks  of  angiogram with potential angioplasty and stenting if needed.contrast reaction, extravasation/ bleeding, dissection, hypotension and death were explained to the patient.  The patient's history has been reviewed and the patient has been examined, no changes in status.  Stable for angiogram/angioplasty  I have reviewed the patient's chart and labs.  Questions were answered to the patient's satisfaction.

## 2023-07-02 NOTE — Discharge Instructions (Signed)
General care instructions: - Do not drive or operate heavy machinery for 24hrs - Avoid making any important decisions for the remainder of the day. - You should be able to eat, drink, and resume your normal medications. - Avoid any strenuous activity for the remainder of the day. Potential complications: - Your hand is more cold or numb than usual. - You are bleeding at the site and it will not stop with direct pressure. If it was a declot expect some oozing at the site. Avoid extreme pressure to the site. - You have a change in the bruit and /or thrill in your fistula or graft. - You have a fever, swelling, see redness or feel heat at or near the puncture site. Medication instructions: - Continue routine medications unless otherwise instructed. 4. Please have your suture removed at your next scheduled dialysis treatment.

## 2023-07-02 NOTE — Op Note (Addendum)
    Patient name: Mark SHAKESPEAR Sr. MRN: [id number] DOB: February 24, 1941 Sex: male  07/02/2023 Pre-operative Diagnosis: ESRD on HD Post-operative diagnosis:  Same Surgeon:  Daria Pastures, MD Assistant: Paulene Floor, MD Procedure Performed:   Access of AV fistula with 18-gauge needle Fistulogram and central venogram Balloon angioplasty of left upper extremity AV fistula 15 minutes of conscious sedation, 1 Versed and 25 fentanyl   Indications: [name] is a 82 year old male that has been experiencing decreased flows during dialysis sessions recently.  Risk and benefits of angiogram with intervention were reviewed and patient expressed understanding is willing to proceed.  Findings:  Approximate 50% stenosis in the mid segment of the left arm AV fistula.  Widely patent central venous system   Procedure:  The patient was identified in the holding area and taken to the cath lab  The patient was then placed supine on the table and prepped and draped in the usual sterile fashion.  A time out was called.  The left AV fistula was accessed with an 18-gauge needle through this a Bentson wire was placed and the access was upsized to a 6 [place] sheath. The stenosis was treated with a 8 mm Mustang balloon with an excellent result and completion angiography demonstrated less than 20% residual stenosis.  The sheath was removed and a nylon stitch was placed.  Contrast: 20 cc Sedation: 15 minutes  Impression: Excellent result with balloon angioplasty of the mid segment stenosis of the left arm AV fistula.   Daria Pastures MD [facility] Office: [phone]

## 2023-07-05 DIAGNOSIS — Z992 Dependence on renal dialysis: Secondary | ICD-10-CM | POA: Diagnosis not present

## 2023-07-05 DIAGNOSIS — N186 End stage renal disease: Secondary | ICD-10-CM | POA: Diagnosis not present

## 2023-07-05 DIAGNOSIS — E1122 Type 2 diabetes mellitus with diabetic chronic kidney disease: Secondary | ICD-10-CM | POA: Diagnosis not present

## 2023-07-07 ENCOUNTER — Other Ambulatory Visit: Payer: Self-pay | Admitting: Family Medicine

## 2023-07-07 DIAGNOSIS — D689 Coagulation defect, unspecified: Secondary | ICD-10-CM | POA: Diagnosis not present

## 2023-07-07 DIAGNOSIS — N186 End stage renal disease: Secondary | ICD-10-CM | POA: Diagnosis not present

## 2023-07-07 DIAGNOSIS — D509 Iron deficiency anemia, unspecified: Secondary | ICD-10-CM | POA: Diagnosis not present

## 2023-07-07 DIAGNOSIS — N2581 Secondary hyperparathyroidism of renal origin: Secondary | ICD-10-CM | POA: Diagnosis not present

## 2023-07-07 DIAGNOSIS — D631 Anemia in chronic kidney disease: Secondary | ICD-10-CM | POA: Diagnosis not present

## 2023-07-07 DIAGNOSIS — E876 Hypokalemia: Secondary | ICD-10-CM | POA: Diagnosis not present

## 2023-07-07 DIAGNOSIS — Z992 Dependence on renal dialysis: Secondary | ICD-10-CM | POA: Diagnosis not present

## 2023-07-08 ENCOUNTER — Ambulatory Visit
Payer: PPO | Attending: Student in an Organized Health Care Education/Training Program | Admitting: Student in an Organized Health Care Education/Training Program

## 2023-07-08 ENCOUNTER — Encounter: Payer: Self-pay | Admitting: Student in an Organized Health Care Education/Training Program

## 2023-07-08 VITALS — BP 130/74 | HR 91 | Temp 97.2°F | Resp 16 | Ht 68.0 in | Wt 143.0 lb

## 2023-07-08 DIAGNOSIS — L97923 Non-pressure chronic ulcer of unspecified part of left lower leg with necrosis of muscle: Secondary | ICD-10-CM | POA: Diagnosis not present

## 2023-07-08 DIAGNOSIS — Z79891 Long term (current) use of opiate analgesic: Secondary | ICD-10-CM | POA: Diagnosis not present

## 2023-07-08 DIAGNOSIS — E1140 Type 2 diabetes mellitus with diabetic neuropathy, unspecified: Secondary | ICD-10-CM | POA: Insufficient documentation

## 2023-07-08 DIAGNOSIS — G894 Chronic pain syndrome: Secondary | ICD-10-CM | POA: Insufficient documentation

## 2023-07-08 DIAGNOSIS — Z0289 Encounter for other administrative examinations: Secondary | ICD-10-CM | POA: Insufficient documentation

## 2023-07-08 MED ORDER — CAPSAICIN-CLEANSING GEL 8 % EX KIT
4.0000 | PACK | Freq: Once | CUTANEOUS | Status: AC
  Administered 2023-07-08: 4 via TOPICAL
  Filled 2023-07-08: qty 4

## 2023-07-08 MED ORDER — HYDROCODONE-ACETAMINOPHEN 5-325 MG PO TABS: 1.0000 | ORAL_TABLET | Freq: Two times a day (BID) | ORAL | 0 refills | Status: DC | PRN

## 2023-07-08 MED ORDER — HYDROCODONE-ACETAMINOPHEN 5-325 MG PO TABS: 1.0000 | ORAL_TABLET | Freq: Two times a day (BID) | ORAL | 0 refills | Status: AC | PRN

## 2023-07-08 NOTE — Progress Notes (Signed)
PROVIDER NOTE: Information contained herein reflects review and annotations entered in association with encounter. Interpretation of such information and data should be left to medically-trained personnel. Information provided to patient can be located elsewhere in the medical record under "Patient Instructions". Document created using STT-dictation technology, any transcriptional errors that may result from process are unintentional.    Patient: Mark Art Sr.  Service Category: E/M  Provider: Edward Jolly, MD  DOB: 19-Sep-1940  DOS: 07/08/2023  Referring Provider: Donita Brooks, MD  MRN: [id number]  Specialty: Interventional Pain Management  PCP: Donita Brooks, MD  Type: Established Patient  Setting: Ambulatory outpatient    Location: Office  Delivery: Face-to-face     HPI  [name], a 82 y.o. year old male, is here today because of his Chronic pain syndrome [G89.4]. Mr. Ohora primary complain today is Foot Pain (bilateral)  Pertinent problems: Mr. Mumaw has End stage renal disease (HCC); Pain management contract signed; Calciphylaxis of left lower extremity with nonhealing ulcer with necrosis of muscle (HCC); and Chronic pain syndrome on their pertinent problem list. Pain Assessment: Severity of Neuropathic pain is reported as a 0-No pain (pain occurs mostly at night)/10. Location: Foot Right, Left/ . Onset: More than a month ago. Quality: Sharp. Timing: Intermittent. Modifying factor(s): rest. Vitals:  height is 5\' 8"  (1.727 m) and weight is 143 lb (64.9 kg). His temporal temperature is 97.2 F (36.2 C) (abnormal). His blood pressure is 130/74 and his pulse is 91. His respiration is 16 and oxygen saturation is 100%.  BMI: Estimated body mass index is 21.74 kg/m as calculated from the following:   Height as of this encounter: 5\' 8"  (1.727 m).   Weight as of this encounter: 143 lb (64.9 kg). Last encounter: 04/01/2023. Last procedure: 02/25/2023.  Reason for  encounter: medication management.   Patient presents today for medication management as well as Qutenza treatment for PDN of bilateral feet.  See attached procedure note for Qutenza treatment.  Patient states that he was hospitalized in the ICU for nephrolithiasis.  He states that he also had an associated infection.  He is recovering from this illness.  He continues with dialysis.  We will refill his hydrocodone as below, no change in dose.   Pharmacotherapy Assessment  Analgesic: Hydrocodone 5 mg every 12 hours as needed, #60/month MME= 15   Monitoring: [place] PMP: PDMP not reviewed this encounter.       Pharmacotherapy: No side-effects or adverse reactions reported. Compliance: No problems identified. Effectiveness: Clinically acceptable.  Concepcion Elk, RN  07/08/2023 12:57 PM  Sign when Signing Visit Nursing Pain Medication Assessment:  Safety precautions to be maintained throughout the outpatient stay will include: orient to surroundings, keep bed in low position, maintain call bell within reach at all times, provide assistance with transfer out of bed and ambulation.  Medication Inspection Compliance: Pill count conducted under aseptic conditions, in front of the patient. Neither the pills nor the bottle was removed from the patient's sight at any time. Once count was completed pills were immediately returned to the patient in their original bottle.  Medication: Hydrocodone/APAP Pill/Patch Count:  06 of 60 pills remain Pill/Patch Appearance: Markings consistent with prescribed medication Bottle Appearance: Standard pharmacy container. Clearly labeled. Filled Date: 51 / 07 / 2024 Last Medication intake:  Yesterday Safety precautions to be maintained throughout the outpatient stay will include: orient to surroundings, keep bed in low position, maintain call bell within reach at all times, provide  assistance with transfer out of bed and ambulation.     No results found for:  "CBDTHCR" No results found for: "D8THCCBX" No results found for: "D9THCCBX"  UDS:  Summary  Date Value Ref Range Status  12/31/2022 Note  Final    Comment:    ==================================================================== [employer] Select 13 (MW) ==================================================================== Test                             Result       Flag       Units  Drug Present and Declared for Prescription Verification   Desmethyldiazepam              497          EXPECTED   ng/mg creat   Oxazepam                       1066         EXPECTED   ng/mg creat   Temazepam                      201          EXPECTED   ng/mg creat    Desmethyldiazepam, oxazepam, and temazepam are expected metabolites    of diazepam. Desmethyldiazepam and oxazepam are also expected    metabolites of other drugs, including chlordiazepoxide, prazepam,    clorazepate, and halazepam. Oxazepam is an expected metabolite of    temazepam. Oxazepam and temazepam are also available as scheduled    prescription medications.    Hydrocodone                    930          EXPECTED   ng/mg creat   Hydromorphone                  234          EXPECTED   ng/mg creat   Dihydrocodeine                 122          EXPECTED   ng/mg creat   Norhydrocodone                 605          EXPECTED   ng/mg creat    Sources of hydrocodone include scheduled prescription medications.    Hydromorphone, dihydrocodeine and norhydrocodone are expected    metabolites of hydrocodone. Hydromorphone and dihydrocodeine are    also available as scheduled prescription medications.  Drug Present not Declared for Prescription Verification   Carboxy-THC                    30           UNEXPECTED ng/mg creat    Carboxy-THC is a metabolite of tetrahydrocannabinol (THC). Source of    THC is most commonly herbal marijuana or marijuana-based products,    but THC is also present in a scheduled prescription medication.    Trace amounts of  THC can be present in hemp and cannabidiol (CBD)    products. This test is not intended to distinguish between delta-9-    tetrahydrocannabinol, the predominant form of THC in most herbal or    marijuana-based products, and delta-8-tetrahydrocannabinol.  ==================================================================== Test  Result    Flag   Units      Ref Range   Creatinine              77               mg/dL      >=40 ==================================================================== Declared Medications:  The flagging and interpretation on this report are based on the  following declared medications.  Unexpected results may arise from  inaccuracies in the declared medications.   **Note: The testing scope of this panel includes these medications:   Diazepam (Valium)  Hydrocodone (Norco)   **Note: The testing scope of this panel does not include the  following reported medications:   Acetaminophen (Norco)  Cinacalcet (Sensipar)  Diltiazem (Cardizem)  Insulin (NovoLog)  Metoprolol (Lopressor)  Ondansetron (Zofran)  Pancrelipase (Creon)  Pravastatin (Pravachol)  Prilocaine (EMLA)  Topical Lidocaine (EMLA) ==================================================================== For clinical consultation, please call [phone]. ====================================================================       ROS  Constitutional: Denies any fever or chills Gastrointestinal: No reported hemesis, hematochezia, vomiting, or acute GI distress Musculoskeletal:  as above Neurological:  Paresthesias of bilateral feet  Medication Review  FreeStyle Libre 3 Plus Sensor, HYDROcodone-acetaminophen, cinacalcet, diazepam, diltiazem, insulin aspart, insulin glargine, lidocaine-prilocaine, pravastatin, sevelamer carbonate, and sucroferric oxyhydroxide  History Review  Allergy: Mr. Easterlin is allergic to enalapril maleate. Drug: Mr. Reuther  reports no history of drug  use. Alcohol:  reports no history of alcohol use. Tobacco:  reports that he has never smoked. He has never used smokeless tobacco. Social: Mr. Hemp  reports that he has never smoked. He has never used smokeless tobacco. He reports that he does not drink alcohol and does not use drugs. Medical:  has a past medical history of Arthritis, Chronic back pain, Chronic kidney disease, Coronary atherosclerosis of native coronary artery, Deafness in right ear, Diabetes mellitus, Diabetic retinopathy (HCC), Diastolic dysfunction (12/2011), Essential hypertension, benign, Heart murmur, HOH (hard of hearing), Hyperkalemia, Kidney stones, Mixed hyperlipidemia, NAFLD (nonalcoholic fatty liver disease), Pancreatic insufficiency, Prostate cancer (HCC) (2011), Shortness of breath dyspnea, Subdural hematoma (HCC), Type 2 diabetes mellitus (HCC), and Wears hearing aid. Surgical: Mr. Wyne  has a past surgical history that includes Prostatectomy (2011); Hernia repair; Appendectomy; Cholecystectomy; Bilateral hip replacement; Cataract extraction w/PHACO (Left, 05/15/2015); Cataract extraction w/PHACO (Right, 08/28/2015); Insertion of dialysis catheter (Right, 01/22/2021); Insertion of dialysis catheter (Right, 04/16/2021); AV fistula placement (Left, 05/15/2021); Cystoscopy w/ ureteral stent placement (Right, 05/29/2023); A/V Fistulagram (Left, 07/02/2023); and PERIPHERAL VASCULAR BALLOON ANGIOPLASTY (07/02/2023). Family: family history includes Diabetes type II in his mother; Heart attack in his father.  Laboratory Chemistry Profile   Renal Lab Results  Component Value Date   BUN 51 (H) 06/02/2023   CREATININE 4.46 (H) 06/02/2023   LABCREA 35.88 01/16/2021   BCR 10 02/11/2023   GFR 65.09 01/29/2017   GFRAA 42 (L) 12/30/2019   GFRNONAA 12 (L) 06/02/2023    Hepatic Lab Results  Component Value Date   AST 35 05/28/2023   ALT 39 05/28/2023   ALBUMIN 1.7 (L) 05/30/2023   ALKPHOS 356 (H) 05/28/2023   LIPASE 21  12/23/2020   AMMONIA 50 12/23/2019    Electrolytes Lab Results  Component Value Date   NA 133 (L) 06/02/2023   K 3.7 06/02/2023   CL 100 06/02/2023   CALCIUM 6.8 (L) 06/02/2023   MG 1.7 02/06/2021   PHOS 3.7 05/30/2023    Bone Lab Results  Component Value Date   VD25OH 25.27 (L) 12/28/2020  TESTOFREE 43.1 (L) 02/28/2015   TESTOSTERONE 295 11/25/2017    Inflammation (CRP: Acute Phase) (ESR: Chronic Phase) Lab Results  Component Value Date   CRP 14.5 (H) 01/09/2021   ESRSEDRATE 11 12/02/2018   LATICACIDVEN 1.7 05/29/2023         Note: Above Lab results reviewed.  Recent Imaging Review  PERIPHERAL VASCULAR CATHETERIZATION Images from the original result were not included.  Patient name: JUSTIS BELSCHNER Sr. MRN: [id number] DOB: [birth year] Sex: male  07/02/2023 Pre-operative Diagnosis: ESRD on HD Post-operative diagnosis:  Same Surgeon:  Daria Pastures, MD Procedure Performed:  Ultrasound-guided access of left upper extremity AV fistula Fistulogram and central venogram Balloon angioplasty of left upper extremity AV fistula 15 minutes of conscious sedation, 1 Versed and 25 fentanyl  Indications: Mr. Mikey Bussing is a 82 year old male that has been experiencing  decreased flows during dialysis sessions recently.  Risk and benefits of  angiogram with intervention were reviewed and patient expressed  understanding is willing to proceed.  Findings:  Approximate 50% stenosis in the mid segment of the left arm AV fistula.   Widely patent central venous system   Procedure:  The patient was identified in the holding area and taken to  the cath lab  The patient was then placed supine on the table and prepped  and draped in the usual sterile fashion.  A time out was called.   Ultrasound was used to evaluate the left arm AV access. This was accessed  under u/s guidance.  The micro sheath was placed into the fistula and  fistulogram demonstrated the above findings.  The stenosis  was treated  with a 8 mm Mustang balloon with an excellent result and completion  angiography demonstrated less than 20% residual stenosis.  The sheath was  removed and a nylon stitch was placed.  Contrast: 20 cc Sedation: 15 minutes  Impression: Excellent result with balloon angioplasty of the mid segment stenosis of  the left arm AV fistula.  Daria Pastures MD [facility] Office: [phone] Note: Reviewed        Physical Exam  General appearance: Well nourished, well developed, and well hydrated. In no apparent acute distress Mental status: Alert, oriented x 3 (person, place, & time)       Respiratory: No evidence of acute respiratory distress Eyes: PERLA Vitals: BP 130/74   Pulse 91   Temp (!) 97.2 F (36.2 C) (Temporal)   Resp 16   Ht 5\' 8"  (1.727 m)   Wt 143 lb (64.9 kg)   SpO2 100%   BMI 21.74 kg/m  BMI: Estimated body mass index is 21.74 kg/m as calculated from the following:   Height as of this encounter: 5\' 8"  (1.727 m).   Weight as of this encounter: 143 lb (64.9 kg). Ideal: Ideal body weight: 68.4 kg (150 lb 12.7 oz)  Assessment   Diagnosis Status  1. Chronic pain syndrome   2. Chronic painful diabetic neuropathy (HCC)   3. Encounter for long-term opiate analgesic use   4. 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 Pain management contract signed    Controlled Controlled Controlled     Plan of Care    Pharmacotherapy (Medications Ordered): Meds ordered this encounter  Medications   capsaicin topical system 8 % patch 4 patch   HYDROcodone-acetaminophen (NORCO/VICODIN) 5-325 MG tablet    Sig: Take 1 tablet by mouth every 12 (twelve) hours as  needed for severe pain (pain score 7-10). Must last 30 days.    Dispense:  60 tablet    Refill:  0    Chronic Pain: STOP Act (Not applicable) Fill 1 day early if closed on refill date. Avoid benzodiazepines within 8 hours  of opioids   HYDROcodone-acetaminophen (NORCO/VICODIN) 5-325 MG tablet    Sig: Take 1 tablet by mouth every 12 (twelve) hours as needed for severe pain (pain score 7-10). Must last 30 days.    Dispense:  60 tablet    Refill:  0    Chronic Pain: STOP Act (Not applicable) Fill 1 day early if closed on refill date. Avoid benzodiazepines within 8 hours of opioids   HYDROcodone-acetaminophen (NORCO/VICODIN) 5-325 MG tablet    Sig: Take 1 tablet by mouth every 12 (twelve) hours as needed for severe pain (pain score 7-10). Must last 30 days.    Dispense:  60 tablet    Refill:  0    Chronic Pain: STOP Act (Not applicable) Fill 1 day early if closed on refill date. Avoid benzodiazepines within 8 hours of opioids   Orders:  No orders of the defined types were placed in this encounter.  Follow-up plan:   Return in about 3 months (around 10/06/2023) for MM & Qutenza, F2F (schedule on clinic day as E/M visit).      Recent Visits No visits were found meeting these conditions. Showing recent visits within past 90 days and meeting all other requirements Today's Visits Date Type Provider Dept  07/08/23 Procedure visit Edward Jolly, MD [facility]  Showing today's visits and meeting all other requirements Future Appointments No visits were found meeting these conditions. Showing future appointments within next 90 days and meeting all other requirements  I discussed the assessment and treatment plan with the patient. The patient was provided an opportunity to ask questions and all were answered. The patient agreed with the plan and demonstrated an understanding of the instructions.  Patient advised to call back or seek an in-person evaluation if the symptoms or condition worsens.  Duration of encounter: .  Total time on encounter, as per AMA guidelines included both the face-to-face and non-face-to-face time personally spent by the physician and/or other qualified health care  professional(s) on the day of the encounter (includes time in activities that require the physician or other qualified health care professional and does not include time in activities normally performed by clinical staff). Physician's time may include the following activities when performed: Preparing to see the patient (e.g., pre-charting review of records, searching for previously ordered imaging, lab work, and nerve conduction tests) Review of prior analgesic pharmacotherapies. Reviewing PMP Interpreting ordered tests (e.g., lab work, imaging, nerve conduction tests) Performing post-procedure evaluations, including interpretation of diagnostic procedures Obtaining and/or reviewing separately obtained history Performing a medically appropriate examination and/or evaluation Counseling and educating the patient/family/caregiver Ordering medications, tests, or procedures Referring and communicating with other health care professionals (when not separately reported) Documenting clinical information in the electronic or other health record Independently interpreting results (not separately reported) and communicating results to the patient/ family/caregiver Care coordination (not separately reported)  Note by: Edward Jolly, MD Date: 07/08/2023; Time: 1:59 PM

## 2023-07-08 NOTE — Telephone Encounter (Signed)
Requested medication (s) are due for refill today - yes  Requested medication (s) are on the active medication list -yes  Future visit scheduled -no  Last refill: 04/11/23 #30 2RF  Notes to clinic: non delegated Rx  Requested Prescriptions  Pending Prescriptions Disp Refills   diazepam (VALIUM) 2 MG tablet [Pharmacy Med Name: DIAZEPAM 2MG  TABLETS] 30 tablet     Sig: TAKE 1 TABLET BY MOUTH EVERY NIGHT AT BEDTIME AS NEEDED FOR INSOMNIA     Not Delegated - Psychiatry: Anxiolytics/Hypnotics 2 Failed - 07/07/2023  6:00 PM      Failed - This refill cannot be delegated      Failed - Urine Drug Screen completed in last 360 days      Failed - Valid encounter within last 6 months    Recent Outpatient Visits           2 years ago Need for immunization against influenza   [facility] Donita Brooks, MD   2 years ago Acute renal failure superimposed on stage 4 chronic kidney disease, unspecified acute renal failure type (HCC)   [facility] Donita Brooks, MD   2 years ago Calciphylaxis of lower extremity with nonhealing ulcer, limited to breakdown of skin (HCC)   [facility] Pickard, Priscille Heidelberg, MD   2 years ago COVID-19   [facility] Pickard, Priscille Heidelberg, MD   2 years ago COVID-19   [facility] Pickard, Priscille Heidelberg, MD       Future Appointments             In 2 days Sharlene Dory, NP [facility] [place], [place]   In 1 month Deliah Boston, Eleonore Chiquito, FNP [facility] - Patient is not pregnant         Requested Prescriptions  Pending Prescriptions Disp Refills   diazepam (VALIUM) 2 MG tablet [Pharmacy Med Name: DIAZEPAM 2MG  TABLETS] 30 tablet     Sig: TAKE 1 TABLET BY MOUTH EVERY NIGHT AT BEDTIME AS NEEDED FOR INSOMNIA     Not Delegated - Psychiatry: Anxiolytics/Hypnotics 2 Failed - 07/07/2023  6:00 PM      Failed - This refill cannot be delegated       Failed - Urine Drug Screen completed in last 360 days      Failed - Valid encounter within last 6 months    Recent Outpatient Visits           2 years ago Need for immunization against influenza   [facility] Donita Brooks, MD   2 years ago Acute renal failure superimposed on stage 4 chronic kidney disease, unspecified acute renal failure type (HCC)   [facility] Donita Brooks, MD   2 years ago Calciphylaxis of lower extremity with nonhealing ulcer, limited to breakdown of skin (HCC)   [facility] Pickard, Priscille Heidelberg, MD   2 years ago COVID-19   [facility] Pickard, Priscille Heidelberg, MD   2 years ago COVID-19   [facility] Pickard, Priscille Heidelberg, MD       Future Appointments             In 2 days Sharlene Dory, NP [facility] at [place], [place]   In 1 month Deliah Boston, Eleonore Chiquito, FNP [facility] [place]  Passed - Patient is not pregnant

## 2023-07-08 NOTE — Patient Instructions (Addendum)
Pain Management Discharge Instructions  General Discharge Instructions :  If you need to reach your doctor call: Monday-Friday 8:00 am - 4:00 pm at [phone] or toll free [phone].  After clinic hours [phone] to have operator reach doctor.  Bring all of your medication bottles to all your appointments in the pain clinic.  To cancel or reschedule your appointment with Pain Management please remember to call 24 hours in advance to avoid a fee.  Refer to the educational materials which you have been given on: General Risks, I had my Procedure. Discharge Instructions, Post Sedation.  Post Procedure Instructions:  The drugs you were given will stay in your system until tomorrow, so for the next 24 hours you should not drive, make any legal decisions or drink any alcoholic beverages.  You may eat anything you prefer, but it is better to start with liquids then soups and crackers, and gradually work up to solid foods.  Please notify your doctor immediately if you have any unusual bleeding, trouble breathing or pain that is not related to your normal pain.  Depending on the type of procedure that was done, some parts of your body may feel week and/or numb.  This usually clears up by tonight or the next day.  Walk with the use of an assistive device or accompanied by an adult for the 24 hours.  You may use ice on the affected area for the first 24 hours.  Put ice in a Ziploc bag and cover with a towel and place against area 15 minutes on 15 minutes off.  You may switch to heat after 24 hours.Capsaicin Patches What is this medication? CAPSAICIN (cap SAY sin) relieves minor pain in your muscles and joints. It works by making your skin feel warm or cool, which blocks pain signals going to the brain. This medicine may be used for other purposes; ask your health care provider or pharmacist if you have questions. COMMON BRAND NAME(S): Qutenza What should I tell my care team before I take  this medication? They need to know if you have any of these conditions: Broken or irritated skin High blood pressure History of heart attack or stroke An unusual or allergic reaction to capsaicin, hot peppers, other medications, foods, dyes, or preservatives Pregnant or trying to get pregnant Breast-feeding How should I use this medication? This medication is for external use only. It is applied by your care team in a hospital or clinic setting. Talk to your care team about the use of this medication in children. Special care may be needed. Overdosage: If you think you have taken too much of this medicine contact a poison control center or emergency room at once. NOTE: This medicine is only for you. Do not share this medicine with others. What if I miss a dose? This does not apply. What may interact with this medication? Interactions are not expected. Do not use any other skin products on the affected area without asking your care team. This list may not describe all possible interactions. Give your health care provider a list of all the medicines, herbs, non-prescription drugs, or dietary supplements you use. Also tell them if you smoke, drink alcohol, or use illegal drugs. Some items may interact with your medicine. What should I watch for while using this medication? Your condition will be monitored carefully while you are receiving this medication. Your blood pressure may go up during the procedure. Do not touch the medication patch during treatment. This medication causes red,  burning skin. You may need pain medication for during and after the procedure. This medication can make you more sensitive to heat for a few days after treatment. Be careful in hot showers or baths. Keep out of the sun. Exercise may make the treated skin feel hotter. Tell your care team if your symptoms do not start to get better or if they get worse. What side effects may I notice from receiving this  medication? Side effects that you should report to your care team as soon as possible: Allergic reactions--skin rash, itching, hives, swelling of the face, lips, tongue, or throat Side effects that usually do not require medical attention (report these to your care team if they continue or are bothersome): Mild skin irritation, redness, or dryness This list may not describe all possible side effects. Call your doctor for medical advice about side effects. You may report side effects to FDA at 1-800-FDA-1088. Where should I keep my medication? This medication is given in a hospital or clinic. It will not be stored at home. NOTE: This sheet is a summary. It may not cover all possible information. If you have questions about this medicine, talk to your doctor, pharmacist, or health care provider.  2024 Elsevier/Gold Standard (2021-05-22 00:00:00)

## 2023-07-08 NOTE — Progress Notes (Signed)
PROVIDER NOTE: Interpretation of information contained herein should be left to medically-trained personnel. Specific patient instructions are provided elsewhere under "Patient Instructions" section of medical record. This document was created in part using STT-dictation technology, any transcriptional errors that may result from this process are unintentional.  Patient: Mark Art Sr. Type: Established DOB: 21-Jun-1941 MRN: [id number] PCP: Donita Brooks, MD  Service: Procedure DOS: 07/08/2023 Setting: Ambulatory Location: Ambulatory outpatient facility Delivery: Face-to-face Provider: Edward Jolly, MD Specialty: Interventional Pain Management Specialty designation: 09 Location: Outpatient facility Ref. Prov.: Donita Brooks, MD       Interventional Therapy   Interventional Treatment:           Type: Qutenza Neurolysis #2  Laterality:  Bilateral Area treated: Feet Imaging Guidance: None Anesthesia/analgesia/anxiolysis/sedation: None required Medication (Right): Qutenza (capsaicin 8%) topical system Medication (Left): Qutenza (capsaicin 8%) topical system Date: 07/08/2023 Performed by: Edward Jolly, MD Rationale (medical necessity): procedure needed and proper for the treatment of [name] medical symptoms and needs. Indication: Painful diabetic peripheral neuralgia (DPN) (ICD-10-CM:E11.40) severe enough to impact quality of life or function. 1. Chronic pain syndrome   2. Chronic painful diabetic neuropathy (HCC)   3. Encounter for long-term opiate analgesic use   4. 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 Pain management contract signed    NAS-11 Pain score:   Pre-procedure: 0-No pain (pain occurs mostly at night)/10   Post-procedure: 0-No pain (pain occurs mostly at night)/10     Position / Prep / Materials:  Position: Supine  Materials: Qutenza Kit  H&P (Pre-op Assessment):  [name] is a 82 y.o.  (year old), male patient, seen today for interventional treatment. He  has a past surgical history that includes Prostatectomy (2011); Hernia repair; Appendectomy; Cholecystectomy; Bilateral hip replacement; Cataract extraction w/PHACO (Left, 05/15/2015); Cataract extraction w/PHACO (Right, 08/28/2015); Insertion of dialysis catheter (Right, 01/22/2021); Insertion of dialysis catheter (Right, 04/16/2021); AV fistula placement (Left, 05/15/2021); Cystoscopy w/ ureteral stent placement (Right, 05/29/2023); A/V Fistulagram (Left, 07/02/2023); and PERIPHERAL VASCULAR BALLOON ANGIOPLASTY (07/02/2023). [name] has a current medication list which includes the following prescription(s): cinacalcet, freestyle libre 3 plus sensor, diazepam, diltiazem, insulin aspart, insulin glargine, lidocaine-prilocaine, pravastatin, sevelamer carbonate, velphoro, [START ON 07/12/2023] hydrocodone-acetaminophen, [START ON 08/11/2023] hydrocodone-acetaminophen, and [START ON 09/10/2023] hydrocodone-acetaminophen. His primarily concern today is the Foot Pain (bilateral)  Initial Vital Signs:  Pulse/HCG Rate: 91  Temp: (!) 97.2 F (36.2 C) Resp: 16 BP: 130/74 SpO2: 100 %  BMI: Estimated body mass index is 21.74 kg/m as calculated from the following:   Height as of this encounter: 5\' 8"  (1.727 m).   Weight as of this encounter: 143 lb (64.9 kg).  Risk Assessment: Allergies: Reviewed. He is allergic to enalapril maleate.  Allergy Precautions: None required Coagulopathies: Reviewed. None identified.  Blood-thinner therapy: None at this time Active Infection(s): Reviewed. None identified. [name] is afebrile  Site Confirmation: [name] was asked to confirm the procedure and laterality before marking the site Procedure checklist: Completed Consent: Before the procedure and under the influence of no sedative(s), amnesic(s), or anxiolytics, the patient was informed of the treatment options, risks and possible  complications. To fulfill our ethical and legal obligations, as recommended by the American Medical Association's Code of Ethics, I have informed the patient of my clinical impression; the nature and purpose of the treatment or procedure; the risks, benefits, and possible complications of the intervention; the alternatives, including doing nothing; the  risk(s) and benefit(s) of the alternative treatment(s) or procedure(s); and the risk(s) and benefit(s) of doing nothing. The patient was provided information about the general risks and possible complications associated with the procedure. These may include, but are not limited to: failure to achieve desired goals, infection, bleeding, organ or nerve damage, allergic reactions, paralysis, and death. In addition, the patient was informed of those risks and complications associated to the procedure, such as failure to decrease pain; infection; bleeding; organ or nerve damage with subsequent damage to sensory, motor, and/or autonomic systems, resulting in permanent pain, numbness, and/or weakness of one or several areas of the body; allergic reactions; (i.e.: anaphylactic reaction); and/or death. Furthermore, the patient was informed of those risks and complications associated with the medications. These include, but are not limited to: allergic reactions (i.e.: anaphylactic or anaphylactoid reaction(s)); adrenal axis suppression; blood sugar elevation that in diabetics may result in ketoacidosis or comma; water retention that in patients with history of congestive heart failure may result in shortness of breath, pulmonary edema, and decompensation with resultant heart failure; weight gain; swelling or edema; medication-induced neural toxicity; particulate matter embolism and blood vessel occlusion with resultant organ, and/or nervous system infarction; and/or aseptic necrosis of one or more joints. Finally, the patient was informed that Medicine is not an exact  science; therefore, there is also the possibility of unforeseen or unpredictable risks and/or possible complications that may result in a catastrophic outcome. The patient indicated having understood very clearly. We have given the patient no guarantees and we have made no promises. Enough time was given to the patient to ask questions, all of which were answered to the patient's satisfaction. [name] has indicated that he wanted to continue with the procedure. Attestation: I, the ordering provider, attest that I have discussed with the patient the benefits, risks, side-effects, alternatives, likelihood of achieving goals, and potential problems during recovery for the procedure that I have provided informed consent. Date  Time: 07/08/2023 12:46 PM  Pre-Procedure Preparation:  Monitoring: As per clinic protocol. Respiration, ETCO2, SpO2, BP, heart rate and rhythm monitor placed and checked for adequate function Safety Precautions: Patient was assessed for positional comfort and pressure points before starting the procedure. Time-out: I initiated and conducted the "Time-out" before starting the procedure, as per protocol. The patient was asked to participate by confirming the accuracy of the "Time Out" information. Verification of the correct person, site, and procedure were performed and confirmed by me, the nursing staff, and the patient. "Time-out" conducted as per Joint Commission's Universal Protocol (UP.01.01.01). Time: 1320 Start Time: 1325 hrs.  Description/Narrative of Procedure:          Region: Distal lower extremity Target Area: Sensory peripheral nerves affected by diabetic peripheral neuropathy Site: Feet Approach: Percutaneous  No./Series: Not applicable  Type: Percutaneous  Purpose: Therapeutic  Region: Distal lower extremities  Start Time: 1325 hrs.  Description of the Procedure: Protocol guidelines were followed. The patient was assisted into a comfortable position.   Informed consent was obtained in the patient monitored in the usual manner.  All questions were answered prior to the procedure.  They Qutenza patches were applied to the affected area and then covered with the wrap.  The Patient was kept under observation until the treatment was completed.  The patches were removed and the treated area was inspected.  Vitals:   07/08/23 1257  BP: 130/74  Pulse: 91  Resp: 16  Temp: (!) 97.2 F (36.2 C)  TempSrc: Temporal  SpO2:  100%  Weight: 143 lb (64.9 kg)  Height: 5\' 8"  (1.727 m)     End Time:   hrs.  Type of Imaging Technique: None used Indication(s): N/A Exposure Time: No patient exposure Contrast: None used. Fluoroscopic Guidance: N/A Ultrasound Guidance: N/A Interpretation: N/A  Post-operative Assessment:  Post-procedure Vital Signs:  Pulse/HCG Rate: 91  Temp: (!) 97.2 F (36.2 C) Resp: 16 BP: 130/74 SpO2: 100 %  EBL: None  Complications: No immediate post-treatment complications observed by team, or reported by patient.  Note: The patient tolerated the entire procedure well. A repeat set of vitals were taken after the procedure and the patient was kept under observation following institutional policy, for this type of procedure. Post-procedural neurological assessment was performed, showing return to baseline, prior to discharge. The patient was provided with post-procedure discharge instructions, including a section on how to identify potential problems. Should any problems arise concerning this procedure, the patient was given instructions to immediately contact us, at any time, without hesitation. In any case, we plan to contact the patient by telephone for a follow-up status report regarding this interventional procedure.  Comments:  No additional relevant information.  Plan of Care (POC)  Orders:  No orders of the defined types were placed in this encounter.  Chronic Opioid Analgesic:  Hydrocodone 5 mg every 12 hours as  needed, #60/month MME= 10   Medications ordered for procedure: Meds ordered this encounter  Medications   capsaicin topical system 8 % patch 4 patch   HYDROcodone-acetaminophen (NORCO/VICODIN) 5-325 MG tablet    Sig: Take 1 tablet by mouth every 12 (twelve) hours as needed for severe pain (pain score 7-10). Must last 30 days.    Dispense:  60 tablet    Refill:  0    Chronic Pain: STOP Act (Not applicable) Fill 1 day early if closed on refill date. Avoid benzodiazepines within 8 hours of opioids   HYDROcodone-acetaminophen (NORCO/VICODIN) 5-325 MG tablet    Sig: Take 1 tablet by mouth every 12 (twelve) hours as needed for severe pain (pain score 7-10). Must last 30 days.    Dispense:  60 tablet    Refill:  0    Chronic Pain: STOP Act (Not applicable) Fill 1 day early if closed on refill date. Avoid benzodiazepines within 8 hours of opioids   HYDROcodone-acetaminophen (NORCO/VICODIN) 5-325 MG tablet    Sig: Take 1 tablet by mouth every 12 (twelve) hours as needed for severe pain (pain score 7-10). Must last 30 days.    Dispense:  60 tablet    Refill:  0    Chronic Pain: STOP Act (Not applicable) Fill 1 day early if closed on refill date. Avoid benzodiazepines within 8 hours of opioids   Medications administered: We administered capsaicin topical system.  See the medical record for exact dosing, route, and time of administration.  Follow-up plan:   Return in about 3 months (around 10/06/2023) for MM & Qutenza, F2F (schedule on clinic day as E/M visit).      Recent Visits No visits were found meeting these conditions. Showing recent visits within past 90 days and meeting all other requirements Today's Visits Date Type Provider Dept  07/08/23 Procedure visit Edward Jolly, MD [facility]  Showing today's visits and meeting all other requirements Future Appointments Date Type Provider Dept  10/02/23 Appointment Edward Jolly, MD [facility]  Showing future  appointments within next 90 days and meeting all other requirements  Disposition: Discharge home  Discharge (Date  Time): 07/08/2023;   hrs.   Primary Care Physician: Donita Brooks, MD Location: [facility] Note by: Edward Jolly, MD (TTS technology used. I apologize for any typographical errors that were not detected and corrected.) Date: 07/08/2023; Time: 2:25 PM  Disclaimer:  Medicine is not an Visual merchandiser. The only guarantee in medicine is that nothing is guaranteed. It is important to note that the decision to proceed with this intervention was based on the information collected from the patient. The Data and conclusions were drawn from the patient's questionnaire, the interview, and the physical examination. Because the information was provided in large part by the patient, it cannot be guaranteed that it has not been purposely or unconsciously manipulated. Every effort has been made to obtain as much relevant data as possible for this evaluation. It is important to note that the conclusions that lead to this procedure are derived in large part from the available data. Always take into account that the treatment will also be dependent on availability of resources and existing treatment guidelines, considered by other Pain Management Practitioners as being common knowledge and practice, at the time of the intervention. For Medico-Legal purposes, it is also important to point out that variation in procedural techniques and pharmacological choices are the acceptable norm. The indications, contraindications, technique, and results of the above procedure should only be interpreted and judged by a Board-Certified Interventional Pain Specialist with extensive familiarity and expertise in the same exact procedure and technique.

## 2023-07-08 NOTE — Progress Notes (Signed)
Nursing Pain Medication Assessment:  Safety precautions to be maintained throughout the outpatient stay will include: orient to surroundings, keep bed in low position, maintain call bell within reach at all times, provide assistance with transfer out of bed and ambulation.  Medication Inspection Compliance: Pill count conducted under aseptic conditions, in front of the patient. Neither the pills nor the bottle was removed from the patient's sight at any time. Once count was completed pills were immediately returned to the patient in their original bottle.  Medication: Hydrocodone/APAP Pill/Patch Count:  06 of 60 pills remain Pill/Patch Appearance: Markings consistent with prescribed medication Bottle Appearance: Standard pharmacy container. Clearly labeled. Filled Date: 49 / 07 / 2024 Last Medication intake:  Yesterday Safety precautions to be maintained throughout the outpatient stay will include: orient to surroundings, keep bed in low position, maintain call bell within reach at all times, provide assistance with transfer out of bed and ambulation.

## 2023-07-10 ENCOUNTER — Encounter: Payer: Self-pay | Admitting: Nurse Practitioner

## 2023-07-10 ENCOUNTER — Ambulatory Visit: Payer: PPO | Attending: Nurse Practitioner | Admitting: Nurse Practitioner

## 2023-07-10 VITALS — BP 114/82 | HR 69 | Ht 68.0 in | Wt 144.6 lb

## 2023-07-10 DIAGNOSIS — R531 Weakness: Secondary | ICD-10-CM

## 2023-07-10 DIAGNOSIS — I10 Essential (primary) hypertension: Secondary | ICD-10-CM

## 2023-07-10 DIAGNOSIS — E785 Hyperlipidemia, unspecified: Secondary | ICD-10-CM

## 2023-07-10 DIAGNOSIS — R011 Cardiac murmur, unspecified: Secondary | ICD-10-CM | POA: Diagnosis not present

## 2023-07-10 DIAGNOSIS — I499 Cardiac arrhythmia, unspecified: Secondary | ICD-10-CM | POA: Diagnosis not present

## 2023-07-10 DIAGNOSIS — I2510 Atherosclerotic heart disease of native coronary artery without angina pectoris: Secondary | ICD-10-CM | POA: Diagnosis not present

## 2023-07-10 DIAGNOSIS — I471 Supraventricular tachycardia, unspecified: Secondary | ICD-10-CM

## 2023-07-10 DIAGNOSIS — Z01810 Encounter for preprocedural cardiovascular examination: Secondary | ICD-10-CM | POA: Diagnosis not present

## 2023-07-10 NOTE — Patient Instructions (Signed)
Medication Instructions:   Your physician recommends that you continue on your current medications as directed. Please refer to the Current Medication list given to you today.   Labwork: None today  Testing/Procedures: Your physician has requested that you have an echocardiogram. Echocardiography is a painless test that uses sound waves to create images of your heart. It provides your doctor with information about the size and shape of your heart and how well your heart's chambers and valves are working. This procedure takes approximately one hour. There are no restrictions for this procedure. Please do NOT wear cologne, perfume, aftershave, or lotions (deodorant is allowed). Please arrive 15 minutes prior to your appointment time.  Please note: We ask at that you not bring children with you during ultrasound (echo/ vascular) testing. Due to room size and safety concerns, children are not allowed in the ultrasound rooms during exams. Our front office staff cannot provide observation of children in our lobby area while testing is being conducted. An adult accompanying a patient to their appointment will only be allowed in the ultrasound room at the discretion of the ultrasound technician under special circumstances. We apologize for any inconvenience.   Follow-Up: 6 months E.Peck,NP, or Dr.McDowell  Any Other Special Instructions Will Be Listed Below (If Applicable).  If you need a refill on your cardiac medications before your next appointment, please call your pharmacy.

## 2023-07-10 NOTE — Progress Notes (Signed)
Cardiology Office Note:  .   Date: 07/10/2023 ID:  Mark Art Sr., DOB [birth year], MRN [id number] PCP: Donita Brooks, MD  [facility] Providers Cardiologist:  Nona Dell, MD    History of Present Illness: .   Mark L Chavis Sr. is a 82 y.o. male with a PMH of CAD, hyperlipidemia, hypertension, type 2 diabetes, SVT, and ESRD on HD, who presents today for preoperative cardiovascular risk assessment.  Last seen by Randall An, PA-C on February 22, 2021 for hospital follow-up.  Was hospitalized due to altered mental status and decreased oral intake, was found to have severe metabolic acidosis in setting of COVID-19 and AKI.  Echocardiogram at that time revealed normal EF, no RWMA, had episodes of SVT during admission and questional burst of A-fib, although no definitive A-fib at the time.  Was recommended to continue observation as there was no clear indication for [facility].  Required initiation of dialysis that was managed by nephrology.  Readmitted in early July 2022 for hypoglycemia/was unresponsive, was also found to have UTI.  Was discharged to [facility] in [place].  I follow-up with [name] on February 22, 2021, he was working with PT at rehab, he continues to note intermittent episodes of palpitations that were most noticed when working with PT.  Did note tachycardia with heart rate up in the 140s as well as occasional dizziness with positional changes.  Cardizem was uptitrated to 240 mg CD.  Hospitalized in October 2024 for sepsis secondary to UTI.  CT was significant for right kidney nephrosis with gas and distal stone obstruction, largest at 4 mm. S/P JJ stent placement and stone removal by urology service.   Our office was recently contacted for preoperative cardiovascular risk assessment.  He is pending cystoscopy with uteroscopy.  Surgeon will be Dr. Wilkie Aye of [facility] in [place].  Date of surgery is TBD.  He presents today for preoperative  cardiovascular risk assessment.  He denies any acute cardiac complaints or issues.  Does admit to chronic weakness and history of falls, however he knows how to compensate and handle this as he walks with a rolling walker.  Says he has to sit down and take breaks when working around the house, mainly homebound and goes outside for doctors visits. Denies any chest pain, shortness of breath, palpitations, syncope, presyncope, dizziness, orthopnea, PND, swelling or significant weight changes, acute bleeding, or claudication.  ROS: Negative.  See HPI.  Studies Reviewed: Marland Kitchen    EKG: EKG Interpretation Date/Time:  Thursday July 10 2023 10:49:46 EST Ventricular Rate:  92 PR Interval:  200 QRS Duration:  118 QT Interval:  358 QTC Calculation: 442 R Axis:   -43  Text Interpretation: Sinus rhythm with occasional Premature ventricular complexes and Premature atrial complexes Left axis deviation Right bundle branch block Minimal voltage criteria for LVH, may be normal variant ( R in aVL ) When compared with ECG of 01-Jun-2023 11:45, Premature ventricular complexes are now Present Premature atrial complexes are now Present Non-specific change in ST segment in Inferior leads T wave inversion less evident in Inferior leads QT has lengthened Confirmed by Sharlene Dory [phone]) on 07/10/2023 10:52:15 AM   Echo 12/2020: 1. Left ventricular ejection fraction, by estimation, is 60 to 65%. The  left ventricle has normal function. The left ventricle has no regional  wall motion abnormalities. There is mild left ventricular hypertrophy.  Left ventricular diastolic parameters  are consistent with Grade I diastolic dysfunction (impaired relaxation).  2. Right ventricular systolic function is normal. The right ventricular  size is normal. There is normal pulmonary artery systolic pressure.   3. The mitral valve is normal in structure. No evidence of mitral valve  regurgitation. No evidence of mitral stenosis.    4. The aortic valve is tricuspid. Aortic valve regurgitation is not  visualized. Mild aortic valve sclerosis is present, with no evidence of  aortic valve stenosis.   5. Aortic dilatation noted. There is mild dilatation of the aortic root,  measuring 39 mm.   6. The inferior vena cava is normal in size with greater than 50%  respiratory variability, suggesting right atrial pressure of 3 mmHg.  Physical Exam:   VS:  BP 114/82   Pulse 69   Ht 5\' 8"  (1.727 m)   Wt 144 lb 9.6 oz (65.6 kg)   BMI 21.99 kg/m    Wt Readings from Last 3 Encounters:  07/10/23 144 lb 9.6 oz (65.6 kg)  07/08/23 143 lb (64.9 kg)  07/02/23 143 lb (64.9 kg)    GEN: Well nourished, well developed in no acute distress NECK: No JVD; No carotid bruits CARDIAC: S1/S2, Grade 2 murmur, no murmurs, rubs, gallops RESPIRATORY:  Clear to auscultation without rales, wheezing or rhonchi  ABDOMEN: Soft, non-tender, non-distended EXTREMITIES:  No edema; No deformity   ASSESSMENT AND PLAN: .    Pre-operative cardiovascular risk assessment [name] perioperative risk of a major cardiac event is 6.6% according to the Revised Cardiac Risk Index (RCRI).  Therefore, he is at high risk for perioperative complications.   His functional capacity is poor at 4.64 METs according to the Duke Activity Status Index (DASI). Recommendations: According to ACC/AHA guidelines, no further cardiovascular testing needed.  The patient may proceed to surgery at acceptable risk.   Antiplatelet and/or Anticoagulation Recommendations: Patient is not on any blood thinner except for heparin IV pushes given for dialysis treatments. Will route note to requesting party.   2. CAD Stable with no anginal symptoms. No indication for ischemic evaluation.  Continue current medication regimen. Heart healthy diet and regular cardiovascular exercise encouraged.   3. HLD LDL 20 02/2023.  Continue pravastatin. Heart healthy diet and regular cardiovascular  exercise encouraged.   4. HTN Blood pressure stable. Discussed to monitor BP at home at least 2 hours after medications and sitting for 5-10 minutes.  No medication changes at this time. Heart healthy diet and regular cardiovascular exercise encouraged.   5. SVT Denies any tachycardia or palpitations.  Continue diltiazem. Heart healthy diet and regular cardiovascular exercise encouraged.   6. Murmur Grade 2/6 murmur on exam.  Will update echocardiogram for further evaluation. This does not need to be done prior to surgery.  7. Weakness  Etiology multifactorial.  Was hospitalized in October 2024 for sepsis secondary to UTI.  Recommended follow-up with PCP for further evaluation, may benefit from PT/OT.   Dispo: Follow-up with Dr. Diona Browner or APP in 6 months or sooner if anything changes.  Signed, Sharlene Dory, NP

## 2023-07-14 DIAGNOSIS — E876 Hypokalemia: Secondary | ICD-10-CM | POA: Diagnosis not present

## 2023-07-14 DIAGNOSIS — D509 Iron deficiency anemia, unspecified: Secondary | ICD-10-CM | POA: Diagnosis not present

## 2023-07-14 DIAGNOSIS — D689 Coagulation defect, unspecified: Secondary | ICD-10-CM | POA: Diagnosis not present

## 2023-07-14 DIAGNOSIS — N2581 Secondary hyperparathyroidism of renal origin: Secondary | ICD-10-CM | POA: Diagnosis not present

## 2023-07-14 DIAGNOSIS — Z992 Dependence on renal dialysis: Secondary | ICD-10-CM | POA: Diagnosis not present

## 2023-07-14 DIAGNOSIS — R110 Nausea: Secondary | ICD-10-CM | POA: Diagnosis not present

## 2023-07-14 DIAGNOSIS — N186 End stage renal disease: Secondary | ICD-10-CM | POA: Diagnosis not present

## 2023-07-16 ENCOUNTER — Other Ambulatory Visit: Payer: PPO

## 2023-07-17 ENCOUNTER — Other Ambulatory Visit: Payer: PPO

## 2023-07-17 DIAGNOSIS — R310 Gross hematuria: Secondary | ICD-10-CM | POA: Diagnosis not present

## 2023-07-17 LAB — URINALYSIS, ROUTINE W REFLEX MICROSCOPIC
Bilirubin, UA: NEGATIVE
Ketones, UA: NEGATIVE
Nitrite, UA: NEGATIVE
Specific Gravity, UA: 1.02 (ref 1.005–1.030)
Urobilinogen, Ur: 0.2 mg/dL (ref 0.2–1.0)
pH, UA: 7 (ref 5.0–7.5)

## 2023-07-17 LAB — MICROSCOPIC EXAMINATION: RBC, Urine: >30 /[HPF] — AB (ref 0–2)

## 2023-07-18 ENCOUNTER — Other Ambulatory Visit: Payer: Self-pay | Admitting: Family Medicine

## 2023-07-18 ENCOUNTER — Telehealth: Payer: Self-pay

## 2023-07-18 ENCOUNTER — Encounter: Payer: Self-pay | Admitting: Family Medicine

## 2023-07-18 MED ORDER — SULFAMETHOXAZOLE-TRIMETHOPRIM 800-160 MG PO TABS: 1.0000 | ORAL_TABLET | Freq: Two times a day (BID) | ORAL | 0 refills | Status: DC

## 2023-07-18 MED ORDER — DIAZEPAM 2 MG PO TABS: 2.0000 mg | ORAL_TABLET | Freq: Every day | ORAL | 0 refills | Status: DC

## 2023-07-18 NOTE — Telephone Encounter (Signed)
Patient completed a pre op urine on 07/17/2023. Urine reviewed with Dr. Ronne Binning   New orders received for antibiotic. Prescription sent to pharmacy. Wife called and notified. Voiced understanding.

## 2023-07-21 ENCOUNTER — Other Ambulatory Visit (HOSPITAL_COMMUNITY): Payer: PPO

## 2023-07-21 DIAGNOSIS — Z992 Dependence on renal dialysis: Secondary | ICD-10-CM | POA: Diagnosis not present

## 2023-07-21 DIAGNOSIS — N2581 Secondary hyperparathyroidism of renal origin: Secondary | ICD-10-CM | POA: Diagnosis not present

## 2023-07-21 DIAGNOSIS — N186 End stage renal disease: Secondary | ICD-10-CM | POA: Diagnosis not present

## 2023-07-21 DIAGNOSIS — D689 Coagulation defect, unspecified: Secondary | ICD-10-CM | POA: Diagnosis not present

## 2023-07-21 DIAGNOSIS — E1122 Type 2 diabetes mellitus with diabetic chronic kidney disease: Secondary | ICD-10-CM | POA: Diagnosis not present

## 2023-07-21 DIAGNOSIS — D509 Iron deficiency anemia, unspecified: Secondary | ICD-10-CM | POA: Diagnosis not present

## 2023-07-22 ENCOUNTER — Encounter (HOSPITAL_COMMUNITY)
Admission: RE | Admit: 2023-07-22 | Discharge: 2023-07-22 | Disposition: A | Discharge status: Home / Self Care | Payer: PPO | Source: Ambulatory Visit | Attending: Urology | Admitting: Urology

## 2023-07-22 DIAGNOSIS — E876 Hypokalemia: Secondary | ICD-10-CM

## 2023-07-23 ENCOUNTER — Encounter (HOSPITAL_COMMUNITY): Payer: Self-pay

## 2023-07-24 ENCOUNTER — Ambulatory Visit (HOSPITAL_BASED_OUTPATIENT_CLINIC_OR_DEPARTMENT_OTHER): Payer: PPO | Admitting: Certified Registered"

## 2023-07-24 ENCOUNTER — Encounter (HOSPITAL_COMMUNITY)
Admission: RE | Disposition: A | Discharge status: Home / Self Care | Payer: Self-pay | Source: Home / Self Care | Attending: Urology

## 2023-07-24 ENCOUNTER — Ambulatory Visit (HOSPITAL_COMMUNITY): Payer: Self-pay | Admitting: Certified Registered"

## 2023-07-24 ENCOUNTER — Encounter (HOSPITAL_COMMUNITY): Payer: Self-pay | Admitting: Urology

## 2023-07-24 ENCOUNTER — Ambulatory Visit (HOSPITAL_COMMUNITY)
Admission: RE | Admit: 2023-07-24 | Discharge: 2023-07-24 | Disposition: A | Discharge status: Home / Self Care | Payer: PPO | Attending: Urology | Admitting: Urology

## 2023-07-24 ENCOUNTER — Ambulatory Visit (HOSPITAL_COMMUNITY): Payer: PPO

## 2023-07-24 DIAGNOSIS — Z8249 Family history of ischemic heart disease and other diseases of the circulatory system: Secondary | ICD-10-CM | POA: Insufficient documentation

## 2023-07-24 DIAGNOSIS — G894 Chronic pain syndrome: Secondary | ICD-10-CM

## 2023-07-24 DIAGNOSIS — N186 End stage renal disease: Secondary | ICD-10-CM | POA: Insufficient documentation

## 2023-07-24 DIAGNOSIS — E1140 Type 2 diabetes mellitus with diabetic neuropathy, unspecified: Secondary | ICD-10-CM

## 2023-07-24 DIAGNOSIS — I120 Hypertensive chronic kidney disease with stage 5 chronic kidney disease or end stage renal disease: Secondary | ICD-10-CM | POA: Insufficient documentation

## 2023-07-24 DIAGNOSIS — Z833 Family history of diabetes mellitus: Secondary | ICD-10-CM | POA: Diagnosis not present

## 2023-07-24 DIAGNOSIS — Z794 Long term (current) use of insulin: Secondary | ICD-10-CM | POA: Diagnosis not present

## 2023-07-24 DIAGNOSIS — N132 Hydronephrosis with renal and ureteral calculous obstruction: Secondary | ICD-10-CM | POA: Insufficient documentation

## 2023-07-24 DIAGNOSIS — I10 Essential (primary) hypertension: Secondary | ICD-10-CM | POA: Diagnosis not present

## 2023-07-24 DIAGNOSIS — Z992 Dependence on renal dialysis: Secondary | ICD-10-CM | POA: Diagnosis not present

## 2023-07-24 DIAGNOSIS — N201 Calculus of ureter: Secondary | ICD-10-CM | POA: Diagnosis not present

## 2023-07-24 DIAGNOSIS — I2510 Atherosclerotic heart disease of native coronary artery without angina pectoris: Secondary | ICD-10-CM | POA: Insufficient documentation

## 2023-07-24 DIAGNOSIS — Z0289 Encounter for other administrative examinations: Secondary | ICD-10-CM

## 2023-07-24 DIAGNOSIS — Z79891 Long term (current) use of opiate analgesic: Secondary | ICD-10-CM

## 2023-07-24 DIAGNOSIS — E1122 Type 2 diabetes mellitus with diabetic chronic kidney disease: Secondary | ICD-10-CM | POA: Diagnosis not present

## 2023-07-24 HISTORY — PX: STONE EXTRACTION WITH BASKET: SHX5318

## 2023-07-24 HISTORY — PX: CYSTOSCOPY WITH RETROGRADE PYELOGRAM, URETEROSCOPY AND STENT PLACEMENT: SHX5789

## 2023-07-24 LAB — GLUCOSE, CAPILLARY: Glucose-Capillary: 96 mg/dL (ref 70–99)

## 2023-07-24 LAB — POCT I-STAT, CHEM 8
BUN: 41 mg/dL — ABNORMAL HIGH (ref 8–23)
Calcium, Ion: 1.17 mmol/L (ref 1.15–1.40)
Chloride: 96 mmol/L — ABNORMAL LOW (ref 98–111)
Creatinine, Ser: 3.8 mg/dL — ABNORMAL HIGH (ref 0.61–1.24)
Glucose, Bld: 99 mg/dL (ref 70–99)
HCT: 40 % (ref 39.0–52.0)
Hemoglobin: 13.6 g/dL (ref 13.0–17.0)
Potassium: 5 mmol/L (ref 3.5–5.1)
Sodium: 137 mmol/L (ref 135–145)
TCO2: 29 mmol/L (ref 22–32)

## 2023-07-24 SURGERY — CYSTOURETEROSCOPY, WITH RETROGRADE PYELOGRAM AND STENT INSERTION
Anesthesia: General | Site: Ureter | Laterality: Right

## 2023-07-24 MED ORDER — PHENYLEPHRINE HCL-NACL 20-0.9 MG/250ML-% IV SOLN
INTRAVENOUS | Status: AC
Start: 2023-07-24 — End: ?
  Filled 2023-07-24: qty 250

## 2023-07-24 MED ORDER — LIDOCAINE HCL (PF) 2 % IJ SOLN
INTRAMUSCULAR | Status: DC | PRN
  Administered 2023-07-24: 60 mg via INTRADERMAL

## 2023-07-24 MED ORDER — LACTATED RINGERS IV SOLN: INTRAVENOUS | Status: DC

## 2023-07-24 MED ORDER — NALOXONE HCL 0.4 MG/ML IJ SOLN
INTRAMUSCULAR | Status: DC | PRN
  Administered 2023-07-24 (×2): 80 ug via INTRAVENOUS

## 2023-07-24 MED ORDER — PROPOFOL 10 MG/ML IV BOLUS
INTRAVENOUS | Status: AC
  Filled 2023-07-24: qty 20

## 2023-07-24 MED ORDER — ORAL CARE MOUTH RINSE: 15.0000 mL | Freq: Once | OROMUCOSAL | Status: AC

## 2023-07-24 MED ORDER — FENTANYL CITRATE (PF) 100 MCG/2ML IJ SOLN
INTRAMUSCULAR | Status: DC | PRN
  Administered 2023-07-24 (×3): 25 ug via INTRAVENOUS

## 2023-07-24 MED ORDER — HYDROCODONE-ACETAMINOPHEN 5-325 MG PO TABS: 1.0000 | ORAL_TABLET | Freq: Two times a day (BID) | ORAL | 0 refills | Status: AC | PRN

## 2023-07-24 MED ORDER — CHLORHEXIDINE GLUCONATE 0.12 % MT SOLN
15.0000 mL | Freq: Once | OROMUCOSAL | Status: AC
  Administered 2023-07-24: 15 mL via OROMUCOSAL

## 2023-07-24 MED ORDER — EPHEDRINE 5 MG/ML INJ
INTRAVENOUS | Status: AC
Start: 2023-07-24 — End: ?
  Filled 2023-07-24: qty 5

## 2023-07-24 MED ORDER — ONDANSETRON HCL 4 MG/2ML IJ SOLN
INTRAMUSCULAR | Status: AC
  Filled 2023-07-24: qty 2

## 2023-07-24 MED ORDER — CEFAZOLIN SODIUM-DEXTROSE 2-4 GM/100ML-% IV SOLN
INTRAVENOUS | Status: AC
  Filled 2023-07-24: qty 100

## 2023-07-24 MED ORDER — SODIUM CHLORIDE 0.9 % IR SOLN
Status: DC | PRN
  Administered 2023-07-24: 3000 mL

## 2023-07-24 MED ORDER — SODIUM CHLORIDE 0.9 % IV SOLN: INTRAVENOUS | Status: DC

## 2023-07-24 MED ORDER — ONDANSETRON HCL 4 MG/2ML IJ SOLN
INTRAMUSCULAR | Status: DC | PRN
  Administered 2023-07-24: 4 mg via INTRAVENOUS

## 2023-07-24 MED ORDER — NALOXONE HCL 0.4 MG/ML IJ SOLN
INTRAMUSCULAR | Status: AC
  Filled 2023-07-24: qty 1

## 2023-07-24 MED ORDER — FENTANYL CITRATE (PF) 100 MCG/2ML IJ SOLN
INTRAMUSCULAR | Status: AC
  Filled 2023-07-24: qty 2

## 2023-07-24 MED ORDER — DIATRIZOATE MEGLUMINE 30 % UR SOLN
URETHRAL | Status: DC | PRN
  Administered 2023-07-24: 10 mL via URETHRAL

## 2023-07-24 MED ORDER — PHENYLEPHRINE 80 MCG/ML (10ML) SYRINGE FOR IV PUSH (FOR BLOOD PRESSURE SUPPORT)
PREFILLED_SYRINGE | INTRAVENOUS | Status: AC
  Filled 2023-07-24: qty 10

## 2023-07-24 MED ORDER — GLYCOPYRROLATE PF 0.2 MG/ML IJ SOSY
PREFILLED_SYRINGE | INTRAMUSCULAR | Status: AC
  Filled 2023-07-24: qty 1

## 2023-07-24 MED ORDER — CEFAZOLIN SODIUM-DEXTROSE 2-4 GM/100ML-% IV SOLN
2.0000 g | INTRAVENOUS | Status: AC
  Administered 2023-07-24: 2 g via INTRAVENOUS

## 2023-07-24 MED ORDER — LIDOCAINE HCL (PF) 2 % IJ SOLN
INTRAMUSCULAR | Status: AC
  Filled 2023-07-24: qty 5

## 2023-07-24 MED ORDER — PHENYLEPHRINE HCL-NACL 20-0.9 MG/250ML-% IV SOLN
INTRAVENOUS | Status: DC | PRN
  Administered 2023-07-24: 15 ug/min via INTRAVENOUS

## 2023-07-24 MED ORDER — PROPOFOL 10 MG/ML IV BOLUS
INTRAVENOUS | Status: DC | PRN
  Administered 2023-07-24: 150 mg via INTRAVENOUS

## 2023-07-24 MED ORDER — WATER FOR IRRIGATION, STERILE IR SOLN
Status: DC | PRN
  Administered 2023-07-24: 500 mL

## 2023-07-24 SURGICAL SUPPLY — 23 items
BAG DRAIN URO TABLE W/ADPT NS (BAG) ×2 IMPLANT
BAG HAMPER (MISCELLANEOUS) ×2 IMPLANT
CATH INTERMIT  6FR 70CM (CATHETERS) ×2 IMPLANT
CLOTH BEACON ORANGE TIMEOUT ST (SAFETY) ×2 IMPLANT
DECANTER SPIKE VIAL GLASS SM (MISCELLANEOUS) ×2 IMPLANT
EXTRACTOR STONE NITINOL NGAGE (UROLOGICAL SUPPLIES) ×1 IMPLANT
GLOVE BIO SURGEON STRL SZ8 (GLOVE) ×2 IMPLANT
GLOVE BIOGEL PI IND STRL 7.0 (GLOVE) ×4 IMPLANT
GOWN STRL REUS W/TWL LRG LVL3 (GOWN DISPOSABLE) ×2 IMPLANT
GOWN STRL REUS W/TWL XL LVL3 (GOWN DISPOSABLE) ×2 IMPLANT
GUIDEWIRE STR DUAL SENSOR (WIRE) ×2 IMPLANT
GUIDEWIRE STR ZIPWIRE 035X150 (MISCELLANEOUS) ×2 IMPLANT
IV NS IRRIG 3000ML ARTHROMATIC (IV SOLUTION) ×4 IMPLANT
KIT TURNOVER CYSTO (KITS) ×2 IMPLANT
MANIFOLD NEPTUNE II (INSTRUMENTS) ×2 IMPLANT
PACK CYSTO (CUSTOM PROCEDURE TRAY) ×2 IMPLANT
PAD ARMBOARD 7.5X6 YLW CONV (MISCELLANEOUS) ×2 IMPLANT
POSITIONER HEAD 8X9X4 ADT (SOFTGOODS) ×2 IMPLANT
STENT URET 6FRX26 CONTOUR (STENTS) ×1 IMPLANT
SYR 10ML LL (SYRINGE) ×2 IMPLANT
SYR CONTROL 10ML LL (SYRINGE) ×2 IMPLANT
TOWEL OR 17X26 4PK STRL BLUE (TOWEL DISPOSABLE) ×2 IMPLANT
WATER STERILE IRR 500ML POUR (IV SOLUTION) ×2 IMPLANT

## 2023-07-24 NOTE — H&P (Signed)
HPI: Mark Art Sr. presents post-operatively. He is accompanied by his wife. - GU history: 1. Prostate cancer.  - Underwent robotic-assisted laparoscopic prostatectomy in March 2011.  2. Kidney stones. 3. ESRD on hemodialysis (MWF). Makes 30-50cc of urine daily.  4. Erectile dysfunction.   He underwent the following procedure on 05/29/2023 by Dr. Ronne Binning:   Preoperative diagnosis: right UPJ stone, sepsis, severe hydronephrosis   Postoperative diagnosis: Same   Procedure:  1. cystoscopy 2. right retrograde pyelography 3. Intraoperative fluoroscopy, under one hour, with interpretation 4. right 6 x 26 JJ stent placement   Postop course: Today He reports feeling much better. He denies increased urinary urgency, frequency, nocturia, dysuria, gross hematuria, hesitancy, straining to void, or sensations of incomplete emptying. He denies flank pain or abdominal pain. He denies fevers, nausea, or vomiting.     Fall Screening: Do you usually have a device to assist in your mobility? Yes - walker    Medications:       Current Outpatient Medications  Medication Sig Dispense Refill   cinacalcet (SENSIPAR) 30 MG tablet SMARTSIG:1 Tablet(s) By Mouth Every Evening (Patient not taking: Reported on 05/28/2023)       Continuous Glucose Sensor (FREESTYLE LIBRE 3 PLUS SENSOR) MISC Change sensor every 15 days. 2 each 6   diazepam (VALIUM) 2 MG tablet TAKE 1 TABLET BY MOUTH EVERY NIGHT AT BEDTIME AS NEEDED FOR INSOMNIA 30 tablet 2   diltiazem (CARDIZEM CD) 240 MG 24 hr capsule TAKE 1 CAPSULE(240 MG) BY MOUTH DAILY 90 capsule 3   HYDROcodone-acetaminophen (NORCO/VICODIN) 5-325 MG tablet Take 1 tablet by mouth every 12 (twelve) hours as needed for severe pain. Must last 30 days. 60 tablet 0   insulin aspart (NOVOLOG) 100 UNIT/ML injection Inject 8 Units into the skin 3 (three) times daily with meals. (Patient taking differently: Inject 2 Units into the skin in the morning. Takes on dialysis days)  10 mL 11   insulin glargine (LANTUS) 100 UNIT/ML injection Inject 0.12 mLs (12 Units total) into the skin at bedtime. (Patient taking differently: Inject 6 Units into the skin in the morning. Takes on days he doesn't have dialysis) 10 mL 11   lidocaine-prilocaine (EMLA) cream APPLY SMALL AMOUNT TO ACCESS SITE (AVF) 30 MINUTES BEFORE DIALYSIS. COVER WITH OCCLUSIVE DRESSING (SARAN WRAP) 30 g 11   pravastatin (PRAVACHOL) 10 MG tablet TAKE 1 TABLET(10 MG) BY MOUTH DAILY WITH BREAKFAST 90 tablet 1   sevelamer carbonate (RENVELA) 800 MG tablet Take 800 mg by mouth 3 (three) times daily with meals.       VELPHORO 500 MG chewable tablet Chew 500 mg by mouth 3 (three) times daily with meals. (Patient not taking: Reported on 05/28/2023)          No current facility-administered medications for this visit.        Allergies: Allergies      Allergies  Allergen Reactions   Enalapril Maleate Cough            Past Medical History:  Diagnosis Date   Arthritis      lower back   Chronic back pain      Disc disease   Chronic kidney disease     Coronary atherosclerosis of native coronary artery      Coronary calcifications by chest CT, Myoview demonstrating inferolateral scar   Deafness in right ear     Diabetes mellitus     Diabetic retinopathy (HCC)      moderate nonproliferative with macular edema  in right eye   Diastolic dysfunction 12/2011    Grade 1.   Essential hypertension, benign     Heart murmur      in past   HOH (hard of hearing)     Hyperkalemia     Kidney stones     Mixed hyperlipidemia     NAFLD (nonalcoholic fatty liver disease)     Pancreatic insufficiency      Diagnosed at [facility]   Prostate cancer [facility]) 2011   Shortness of breath dyspnea      occasional - liver pressing on right lung, decreased capacity   Subdural hematoma (HCC)     Type 2 diabetes mellitus (HCC)     Wears hearing aid      left             Past Surgical History:  Procedure Laterality Date    APPENDECTOMY       AV FISTULA PLACEMENT Left 05/15/2021    Procedure: INSERTION OF LEFT ARM ARTERIOVENOUS (AV) GORE-TEX GRAFT;  Surgeon: Larina Earthly, MD;  Location: [facility];  Service: Vascular;  Laterality: Left;   Bilateral hip replacement       CATARACT EXTRACTION W/PHACO Left 05/15/2015    Procedure: CATARACT EXTRACTION PHACO AND INTRAOCULAR LENS PLACEMENT (IOC);  Surgeon: Sherald Hess, MD;  Location: [facility];  Service: Ophthalmology;  Laterality: Left;  DIABETIC - insulin pump and oral meds   CATARACT EXTRACTION W/PHACO Right 08/28/2015    Procedure: CATARACT EXTRACTION PHACO AND INTRAOCULAR LENS PLACEMENT (IOC);  Surgeon: Sherald Hess, MD;  Location: [facility];  Service: Ophthalmology;  Laterality: Right;  DIABETIC PER PT AND CINDY PLEASE KEEP ARRIVAL TIME AFTER 8AM    CHOLECYSTECTOMY       CYSTOSCOPY W/ URETERAL STENT PLACEMENT Right 05/29/2023    Procedure: CYSTOSCOPY WITH RETROGRADE PYELOGRAM/URETERAL STENT PLACEMENT;  Surgeon: Malen Gauze, MD;  Location: [facility];  Service: Urology;  Laterality: Right;   HERNIA REPAIR       INSERTION OF DIALYSIS CATHETER Right 01/22/2021    Procedure: INSERTION OF DIALYSIS CATHETER;  Surgeon: Lucretia Roers, MD;  Location: [facility];  Service: General;  Laterality: Right;   INSERTION OF DIALYSIS CATHETER Right 04/16/2021    Procedure: INSERTION OF DIALYSIS CATHETER;  Surgeon: Lucretia Roers, MD;  Location: [facility];  Service: General;  Laterality: Right;   PROSTATECTOMY   2011             Family History  Problem Relation Age of Onset   Diabetes type II Mother     Heart attack Father          Social History         Socioeconomic History   Marital status: Married      Spouse name: Darlene   Number of children: 3   Years of education: Not on file   Highest education level: Not on file  Occupational History   Not on file  Tobacco Use   Smoking status: Never   Smokeless tobacco: Never   Vaping Use   Vaping status: Never Used  Substance and Sexual Activity   Alcohol use: No   Drug use: No   Sexual activity: Yes  Other Topics Concern   Not on file  Social History Narrative    3 children, 1 daughter and 1 son sorry deceased. 1 daughter living in [place] and 1 step child    Married x 13 years 08/25/21.    7  grandchildren    1 great grandchild    Social Determinants of Health        Financial Resource Strain: Low Risk  (08/22/2022)    Overall Financial Resource Strain (CARDIA)     Difficulty of Paying Living Expenses: Not hard at all  Food Insecurity: No Food Insecurity (06/05/2023)    Hunger Vital Sign     Worried About Running Out of Food in the Last Year: Never true     Ran Out of Food in the Last Year: Never true  Transportation Needs: No Transportation Needs (06/05/2023)    PRAPARE - Therapist, art (Medical): No     Lack of Transportation (Non-Medical): No  Physical Activity: Inactive (08/22/2022)    Exercise Vital Sign     Days of Exercise per Week: 0 days     Minutes of Exercise per Session: 0 min  Stress: No Stress Concern Present (08/22/2022)    [employer] of Occupational Health - Occupational Stress Questionnaire     Feeling of Stress : Not at all  Social Connections: Moderately Isolated (08/22/2022)    Social Connection and Isolation Panel [NHANES]     Frequency of Communication with Friends and Family: More than three times a week     Frequency of Social Gatherings with Friends and Family: Three times a week     Attends Religious Services: Never     Active Member of Clubs or Organizations: No     Attends Banker Meetings: Never     Marital Status: Married  Catering manager Violence: Not At Risk (06/05/2023)    Humiliation, Afraid, Rape, and Kick questionnaire     Fear of Current or Ex-Partner: No     Emotionally Abused: No     Physically Abused: No     Sexually Abused: No      SUBJECTIVE    Review of Systems Constitutional: Patient denies any unintentional weight loss or change in strength lntegumentary: Patient denies any rashes or pruritus Cardiovascular: Patient denies chest pain or syncope Respiratory: Patient denies shortness of breath Gastrointestinal: Patient denies nausea, vomiting, constipation, or diarrhea Musculoskeletal: Patient denies muscle cramps or weakness Neurologic: Patient denies convulsions or seizures Allergic/Immunologic: Patient denies recent allergic reaction(s) Hematologic/Lymphatic: Patient denies bleeding tendencies Endocrine: Patient denies heat/cold intolerance   GU: As per HPI.   OBJECTIVE    Vitals:    06/10/23 1359  BP: 128/77  Pulse: (!) 118  Temp: 98.6 F (37 C)    There is no height or weight on file to calculate BMI.   Physical Examination Constitutional: No obvious distress; patient is non-toxic appearing  Cardiovascular: No visible lower extremity edema.  Respiratory: The patient does not have audible wheezing/stridor; respirations do not appear labored  Gastrointestinal: Abdomen non-distended Musculoskeletal: Normal ROM of UEs  Skin: No obvious rashes/open sores  Neurologic: CN 2-12 grossly intact Psychiatric: Answered questions appropriately with normal affect  Hematologic/Lymphatic/Immunologic: No obvious bruises or sites of spontaneous bleeding   UA: unable to void during today's visit   ASSESSMENT Right ureteral stone - Plan: DG Abd 1 View, Ambulatory Referral For Surgery Scheduling   Hydronephrosis of right kidney - Plan: DG Abd 1 View, Ambulatory Referral For Surgery Scheduling   Ureteral stent present - Plan: DG Abd 1 View, Ambulatory Referral For Surgery Scheduling   History of sepsis - Plan: DG Abd 1 View, Ambulatory Referral For Surgery Scheduling   Hospital discharge follow-up - Plan: DG Abd 1 View  We reviewed recent history including hospital admission and his operative procedures/findings.  Unfortunately he and his wife had somehow been given the impression during his hospital stay that the ureteral stone and stent had been removed at some point. Patient dismayed to learn that another procedure will be necessary for stone extraction. Discussed option to start Flomax for medical expulsive therapy however we agreed to hold off on that due to his ESRD. KUB ordered to assess for potential intermittent stone passage, however he was advised that right ureteroscopic stone manipulation is the likely next step. Surgery request submitted today for that with Dr. Ronne Binning, who will be notified.    Patient agreed to take urine specimen cup home; he will collect urine specimen and drop off at lab later this week to confirm resolution of UTI.    Pt verbalized understanding and agreement. All questions were answered.   PLAN -We discussed the management of kidney stones. These options include observation, ureteroscopy, shockwave lithotripsy (ESWL) and percutaneous nephrolithotomy (PCNL). We discussed which options are relevant to the patient's stone(s). We discussed the natural history of kidney stones as well as the complications of untreated stones and the impact on quality of life without treatment as well as with each of the above listed treatments. We also discussed the efficacy of each treatment in its ability to clear the stone burden. With any of these management options I discussed the signs and symptoms of infection and the need for emergent treatment should these be experienced. For each option we discussed the ability of each procedure to clear the patient of their stone burden.   For observation I described the risks which include but are not limited to silent renal damage, life-threatening infection, need for emergent surgery, failure to pass stone and pain.   For ureteroscopy I described the risks which include bleeding, infection, damage to contiguous structures, positioning injury, ureteral  stricture, ureteral avulsion, ureteral injury, need for prolonged ureteral stent, inability to perform ureteroscopy, need for an interval procedure, inability to clear stone burden, stent discomfort/pain, heart attack, stroke, pulmonary embolus and the inherent risks with general anesthesia.   For shockwave lithotripsy I described the risks which include arrhythmia, kidney contusion, kidney hemorrhage, need for transfusion, pain, inability to adequately break up stone, inability to pass stone fragments, Steinstrasse, infection associated with obstructing stones, need for alternate surgical procedure, need for repeat shockwave lithotripsy, MI, CVA, PE and the inherent risks with anesthesia/conscious sedation.   For PCNL I described the risks including positioning injury, pneumothorax, hydrothorax, need for chest tube, inability to clear stone burden, renal laceration, arterial venous fistula or malformation, need for embolization of kidney, loss of kidney or renal function, need for repeat procedure, need for prolonged nephrostomy tube, ureteral avulsion, MI, CVA, PE and the inherent risks of general anesthesia.   - The patient would like to proceed with right ureteroscopic stone extraction

## 2023-07-24 NOTE — Op Note (Signed)
Preoperative diagnosis: Right ureteral stones  Postoperative diagnosis: Same  Procedure: 1 cystoscopy 2. Right retrograde pyelography 3.  Intraoperative fluoroscopy, under one hour, with interpretation 4.  Right ureteroscopic stone manipulation with basket extraction 5.  right 6 x 26 JJ stent placement  Attending: Cleda Mccreedy  Anesthesia: General  Estimated blood loss: None  Drains: Right 6 x 26 JJ ureteral stent with tether  Specimens: stone for analysis  Antibiotics: ancef  Findings: Right mid ureteral calculi. Mild hydronephrosis. No masses/lesions in the bladder. Ureteral orifices in normal anatomic location.  Indications: Patient is a 82 year old male with a history of right ureteral stone who underwent right stent placement 1 month ago for spesis from a ureteral calculus.  After discussing treatment options, he decided proceed with right ureteroscopic stone manipulation.  Procedure in detail: The patient was brought to the operating room and a brief timeout was done to ensure correct patient, correct procedure, correct site.  General anesthesia was administered patient was placed in dorsal lithotomy position.  His genitalia was then prepped and draped in usual sterile fashion.  A rigid 22 French cystoscope was passed in the urethra and the bladder.  Bladder was inspected free masses or lesions.  the ureteral orifices were in the normal orthotopic locations.  a 6 french ureteral catheter was then instilled into the right ureteral orifice.  a gentle retrograde was obtained and findings noted above.  Using a grasper the right ureteral stent was brought to the urethral meatus. we then placed a zip wire through the ureteral stent and advanced up to the renal pelvis. The stent was then removed we then removed the cystoscope and cannulated the right ureteral orifice with a semirigid ureteroscope.  We located multiple calculi in the mid ureter which were removed with an NGage basket.   We then placed a 6 x 26 double-j ureteral stent over the original zip wire.  We then removed the wire and good coil was noted in the the renal pelvis under fluoroscopy and the bladder under direct vision. the bladder was then drained and this concluded the procedure which was well tolerated by patient.  Complications: None  Condition: Stable, extubated, transferred to PACU  Plan: Patient is to be discharged home as to follow-up in one week. She is to remove her stent in 72 hours by pulling the tether

## 2023-07-24 NOTE — Anesthesia Procedure Notes (Addendum)
Procedure Name: LMA Insertion Date/Time: 07/24/2023 11:32 AM  Performed by: Lorin Glass, CRNAPre-anesthesia Checklist: Patient identified, Emergency Drugs available, Patient being monitored and Suction available Patient Re-evaluated:Patient Re-evaluated prior to induction Oxygen Delivery Method: Circle system utilized Preoxygenation: Pre-oxygenation with 100% oxygen Induction Type: IV induction LMA: LMA inserted LMA Size: 5.0 Number of attempts: 1 Placement Confirmation: positive ETCO2

## 2023-07-24 NOTE — Transfer of Care (Signed)
Immediate Anesthesia Transfer of Care Note  Patient: [name].  Procedure(s) Performed: CYSTOSCOPY WITH RETROGRADE PYELOGRAM, URETEROSCOPY AND STENT PLACEMENT (Right: Ureter) STONE EXTRACTION WITH BASKET (Right: Ureter)  Patient Location: PACU  Anesthesia Type:General  Level of Consciousness: drowsy  Airway & Oxygen Therapy: Patient Spontanous Breathing and Patient connected to face mask oxygen  Post-op Assessment: Report given to RN and Post -op Vital signs reviewed and stable  Post vital signs: Reviewed and stable  Last Vitals:  Vitals Value Taken Time  BP 114/51 07/24/23 1224  Temp 97.5   Pulse 114/51   Resp 16   SpO2 100%   Vitals shown include unfiled device data.  Last Pain:  Vitals:   07/24/23 0923  TempSrc: Oral  PainSc: 0-No pain         Complications: No notable events documented.

## 2023-07-24 NOTE — Anesthesia Preprocedure Evaluation (Addendum)
Anesthesia Evaluation  Patient identified by MRN, date of birth, ID band Patient awake    Reviewed: Allergy & Precautions, H&P , NPO status , Patient's Chart, lab work & pertinent test results, reviewed documented beta blocker date and time   Airway Mallampati: II  TM Distance: >3 FB Neck ROM: full    Dental no notable dental hx.    Pulmonary shortness of breath   Pulmonary exam normal breath sounds clear to auscultation       Cardiovascular Exercise Tolerance: Good hypertension, + CAD  + Valvular Problems/Murmurs  Rhythm:regular Rate:Normal     Neuro/Psych  Neuromuscular disease  negative psych ROS   GI/Hepatic negative GI ROS, Neg liver ROS,,,  Endo/Other  diabetes    Renal/GU Renal disease  negative genitourinary   Musculoskeletal   Abdominal   Peds  Hematology  (+) Blood dyscrasia, anemia   Anesthesia Other Findings   Reproductive/Obstetrics negative OB ROS                             Anesthesia Physical Anesthesia Plan  ASA: 3  Anesthesia Plan: General   Post-op Pain Management:    Induction:   PONV Risk Score and Plan: Propofol infusion  Airway Management Planned:   Additional Equipment:   Intra-op Plan:   Post-operative Plan:   Informed Consent: I have reviewed the patients History and Physical, chart, labs and discussed the procedure including the risks, benefits and alternatives for the proposed anesthesia with the patient or authorized representative who has indicated his/her understanding and acceptance.     Dental Advisory Given  Plan Discussed with: CRNA  Anesthesia Plan Comments:        Anesthesia Quick Evaluation

## 2023-07-24 NOTE — Progress Notes (Signed)
I-Stat results reviewed by Dr. Johnnette Litter.

## 2023-07-25 ENCOUNTER — Encounter (HOSPITAL_COMMUNITY): Payer: Self-pay | Admitting: Urology

## 2023-07-25 NOTE — Anesthesia Postprocedure Evaluation (Signed)
Anesthesia Post Note  Patient: Mark Art Sr.  Procedure(s) Performed: CYSTOSCOPY WITH RETROGRADE PYELOGRAM, URETEROSCOPY AND STENT PLACEMENT (Right: Ureter) STONE EXTRACTION WITH BASKET (Right: Ureter)  Patient location during evaluation: Phase II Anesthesia Type: General Level of consciousness: awake Pain management: pain level controlled Vital Signs Assessment: post-procedure vital signs reviewed and stable Respiratory status: spontaneous breathing and respiratory function stable Cardiovascular status: blood pressure returned to baseline and stable Postop Assessment: no headache and no apparent nausea or vomiting Anesthetic complications: no Comments: Late entry   No notable events documented.   Last Vitals:  Vitals:   07/24/23 1315 07/24/23 1326  BP: (!) 137/57 (!) 123/56  Pulse: 82 92  Resp: 13 14  Temp:  (!) 36.4 C  SpO2: 98% 100%    Last Pain:  Vitals:   07/24/23 1326  TempSrc: Oral  PainSc: 0-No pain                 Windell Norfolk

## 2023-07-25 NOTE — Progress Notes (Signed)
 Name: Mark NISHIYAMA Sr. DOB: [birth year] MRN: [id number]  Diagnoses: 1) Post-operative state  HPI: Mark LITTIE Lay Sr. presents post-operatively.  He is accompanied by his wife. - GU history includes:  1. Prostate cancer.  - Underwent robotic-assisted laparoscopic prostatectomy in March 2011.  2. Kidney stones. 3. ESRD on hemodialysis (MWF). Makes 30-50cc of urine daily.  4. Erectile dysfunction.  Recent history: > 05/29/2023: Underwent right ureteroscopy with stent placement by Dr. Sherrilee for management of right UPJ stone, sepsis, severe hydronephrosis.  He underwent the following procedure on 07/24/2023 by Dr. Sherrilee:  Preoperative diagnosis: Right ureteral stones   Postoperative diagnosis: Same   Procedure:  1. Cystoscopy 2. Right retrograde pyelography 3.  Intraoperative fluoroscopy, under one hour, with interpretation 4.  Right ureteroscopic stone manipulation with basket extraction 5.  right 6 x 26 JJ stent placement   Drains: Right 6 x 26 JJ ureteral stent with tether   Findings: Right mid ureteral calculi. Mild hydronephrosis.  Postop course: He reports that he removed the ureteral stent on 07/27/2023 without difficulty. He denies increased urinary urgency, frequency, nocturia, dysuria, gross hematuria, hesitancy, straining to void, or sensations of incomplete emptying. He denies flank pain or abdominal pain. He denies fevers, nausea, or vomiting.   Fall Screening: Do you usually have a device to assist in your mobility? Yes - walker   Medications: Current Outpatient Medications  Medication Sig Dispense Refill   Continuous Glucose Sensor (FREESTYLE LIBRE 3 PLUS SENSOR) MISC Change sensor every 15 days. 2 each 6   diazepam  (VALIUM ) 2 MG tablet Take 1 tablet (2 mg total) by mouth at bedtime. 30 tablet 0   diltiazem  (CARDIZEM  CD) 240 MG 24 hr capsule TAKE 1 CAPSULE(240 MG) BY MOUTH DAILY 90 capsule 3   [START ON 08/11/2023] HYDROcodone -acetaminophen   (NORCO/VICODIN) 5-325 MG tablet Take 1 tablet by mouth every 12 (twelve) hours as needed for severe pain (pain score 7-10). Must last 30 days. 60 tablet 0   [START ON 09/10/2023] HYDROcodone -acetaminophen  (NORCO/VICODIN) 5-325 MG tablet Take 1 tablet by mouth every 12 (twelve) hours as needed for severe pain (pain score 7-10). Must last 30 days. 60 tablet 0   HYDROcodone -acetaminophen  (NORCO/VICODIN) 5-325 MG tablet Take 1 tablet by mouth every 12 (twelve) hours as needed for severe pain (pain score 7-10). Must last 30 days. 15 tablet 0   insulin  aspart (NOVOLOG ) 100 UNIT/ML injection Inject 8 Units into the skin 3 (three) times daily with meals. (Patient taking differently: Inject 2 Units into the skin in the morning. Takes on dialysis days) 10 mL 11   insulin  glargine (LANTUS ) 100 UNIT/ML injection Inject 0.12 mLs (12 Units total) into the skin at bedtime. (Patient taking differently: Inject 6 Units into the skin in the morning. Takes on days he doesn't have dialysis; Sun Tue Th, Sat) 10 mL 11   lidocaine -prilocaine  (EMLA ) cream APPLY SMALL AMOUNT TO ACCESS SITE (AVF) 30 MINUTES BEFORE DIALYSIS. COVER WITH OCCLUSIVE DRESSING (SARAN WRAP) 30 g 11   pravastatin  (PRAVACHOL ) 10 MG tablet TAKE 1 TABLET(10 MG) BY MOUTH DAILY WITH BREAKFAST 90 tablet 1   sevelamer  carbonate (RENVELA ) 800 MG tablet Take 2,400 mg by mouth 3 (three) times daily with meals.     sulfamethoxazole -trimethoprim  (BACTRIM  DS) 800-160 MG tablet Take 1 tablet by mouth 2 (two) times daily. 14 tablet 0   No current facility-administered medications for this visit.    Allergies: Allergies  Allergen Reactions   Enalapril Maleate Cough    Past Medical History:  Diagnosis Date   Arthritis    lower back   Chronic back pain    Disc disease   Chronic kidney disease    Coronary atherosclerosis of native coronary artery    Coronary calcifications by chest CT, Myoview  demonstrating inferolateral scar   Deafness in right ear     Diabetes mellitus    Diabetic retinopathy (HCC)    moderate nonproliferative with macular edema in right eye   Diastolic dysfunction 12/2011   Grade 1.   Essential hypertension, benign    Heart murmur    in past   HOH (hard of hearing)    Hyperkalemia    Kidney stones    Mixed hyperlipidemia    NAFLD (nonalcoholic fatty liver disease)    Pancreatic insufficiency    Diagnosed at [facility]   Prostate cancer [facility]) 2011   Shortness of breath dyspnea    occasional - liver pressing on right lung, decreased capacity   Subdural hematoma (HCC)    Type 2 diabetes mellitus (HCC)    Wears hearing aid    left   Past Surgical History:  Procedure Laterality Date   A/V FISTULAGRAM Left 07/02/2023   Procedure: A/V Fistulagram;  Surgeon: Pearline Norman RAMAN, MD;  Location: [facility] INVASIVE CV LAB;  Service: Vascular;  Laterality: Left;   APPENDECTOMY     AV FISTULA PLACEMENT Left 05/15/2021   Procedure: INSERTION OF LEFT ARM ARTERIOVENOUS (AV) GORE-TEX GRAFT;  Surgeon: Oris Krystal FALCON, MD;  Location: [facility] ORS;  Service: Vascular;  Laterality: Left;   Bilateral hip replacement     CATARACT EXTRACTION W/PHACO Left 05/15/2015   Procedure: CATARACT EXTRACTION PHACO AND INTRAOCULAR LENS PLACEMENT (IOC);  Surgeon: Donzell Arlyce Budd, MD;  Location: [facility] SURGERY CNTR;  Service: Ophthalmology;  Laterality: Left;  DIABETIC - insulin  pump and oral meds   CATARACT EXTRACTION W/PHACO Right 08/28/2015   Procedure: CATARACT EXTRACTION PHACO AND INTRAOCULAR LENS PLACEMENT (IOC);  Surgeon: Donzell Arlyce Budd, MD;  Location: [facility] SURGERY CNTR;  Service: Ophthalmology;  Laterality: Right;  DIABETIC PER PT AND CINDY PLEASE KEEP ARRIVAL TIME AFTER 8AM    CHOLECYSTECTOMY     CYSTOSCOPY W/ URETERAL STENT PLACEMENT Right 05/29/2023   Procedure: CYSTOSCOPY WITH RETROGRADE PYELOGRAM/URETERAL STENT PLACEMENT;  Surgeon: Sherrilee Belvie CROME, MD;  Location: [facility];  Service: Urology;  Laterality: Right;   CYSTOSCOPY WITH  RETROGRADE PYELOGRAM, URETEROSCOPY AND STENT PLACEMENT Right 07/24/2023   Procedure: CYSTOSCOPY WITH RETROGRADE PYELOGRAM, URETEROSCOPY AND STENT PLACEMENT;  Surgeon: Sherrilee Belvie CROME, MD;  Location: [facility];  Service: Urology;  Laterality: Right;  dialysis pt   HERNIA REPAIR     INSERTION OF DIALYSIS CATHETER Right 01/22/2021   Procedure: INSERTION OF DIALYSIS CATHETER;  Surgeon: Kallie Manuelita BROCKS, MD;  Location: [facility] OR[facility];  Service: General;  Laterality: Right;   INSERTION OF DIALYSIS CATHETER Right 04/16/2021   Procedure: INSERTION OF DIALYSIS CATHETER;  Surgeon: Kallie Manuelita BROCKS, MD;  Location: [facility];  Service: General;  Laterality: Right;   PERIPHERAL VASCULAR BALLOON ANGIOPLASTY  07/02/2023   Procedure: PERIPHERAL VASCULAR BALLOON ANGIOPLASTY;  Surgeon: Pearline Norman RAMAN, MD;  Location: [facility] INVASIVE CV LAB;  Service: Vascular;;  lt arm fistula   PROSTATECTOMY  2011   STONE EXTRACTION WITH BASKET Right 07/24/2023   Procedure: STONE EXTRACTION WITH BASKET;  Surgeon: Sherrilee Belvie CROME, MD;  Location: [facility];  Service: Urology;  Laterality: Right;   Family History  Problem Relation Age of Onset   Diabetes type II Mother    Heart  attack Father    Social History   Socioeconomic History   Marital status: Married    Spouse name: Darlene   Number of children: 3   Years of education: Not on file   Highest education level: Not on file  Occupational History   Not on file  Tobacco Use   Smoking status: Never   Smokeless tobacco: Never  Vaping Use   Vaping status: Never Used  Substance and Sexual Activity   Alcohol  use: No   Drug use: No   Sexual activity: Yes  Other Topics Concern   Not on file  Social History Narrative   3 children, 1 daughter and 1 son sorry deceased. 1 daughter living in [place] and 1 step child   Married x 13 years 08/25/21.   7 grandchildren   1 great grandchild   Social Drivers of Corporate Investment Banker Strain: Low Risk  (08/22/2022)   Overall  Financial Resource Strain (CARDIA)    Difficulty of Paying Living Expenses: Not hard at all  Food Insecurity: No Food Insecurity (06/05/2023)   Hunger Vital Sign    Worried About Running Out of Food in the Last Year: Never true    Ran Out of Food in the Last Year: Never true  Transportation Needs: No Transportation Needs (06/05/2023)   PRAPARE - Administrator, Civil Service (Medical): No    Lack of Transportation (Non-Medical): No  Physical Activity: Inactive (08/22/2022)   Exercise Vital Sign    Days of Exercise per Week: 0 days    Minutes of Exercise per Session: 0 min  Stress: No Stress Concern Present (08/22/2022)   [employer] of Occupational Health - Occupational Stress Questionnaire    Feeling of Stress : Not at all  Social Connections: Moderately Isolated (08/22/2022)   Social Connection and Isolation Panel [NHANES]    Frequency of Communication with Friends and Family: More than three times a week    Frequency of Social Gatherings with Friends and Family: Three times a week    Attends Religious Services: Never    Active Member of Clubs or Organizations: No    Attends Banker Meetings: Never    Marital Status: Married  Catering Manager Violence: Not At Risk (06/05/2023)   Humiliation, Afraid, Rape, and Kick questionnaire    Fear of Current or Ex-Partner: No    Emotionally Abused: No    Physically Abused: No    Sexually Abused: No    SUBJECTIVE  Review of Systems Constitutional: Patient denies any unintentional weight loss or change in strength lntegumentary: Patient denies any rashes or pruritus Cardiovascular: Patient denies chest pain or syncope Respiratory: Patient denies shortness of breath Gastrointestinal: Patient denies nausea, vomiting, constipation, or diarrhea Musculoskeletal: Patient denies muscle cramps or weakness Neurologic: Patient denies convulsions or seizures Allergic/Immunologic: Patient denies recent allergic  reaction(s) Hematologic/Lymphatic: Patient denies bleeding tendencies Endocrine: Patient denies heat/cold intolerance  GU: As per HPI.  OBJECTIVE Vitals:   08/07/23 1413  BP: (!) 152/43  Pulse: (!) 111   There is no height or weight on file to calculate BMI.  Physical Examination Constitutional: No obvious distress; patient is non-toxic appearing  Cardiovascular: No visible lower extremity edema.  Respiratory: The patient does not have audible wheezing/stridor; respirations do not appear labored  Gastrointestinal: Abdomen non-distended Musculoskeletal: Normal ROM of UEs  Skin: No obvious rashes/open sores  Neurologic: CN 2-12 grossly intact Psychiatric: Answered questions appropriately with normal affect  Hematologic/Lymphatic/Immunologic: No obvious bruises or  sites of spontaneous bleeding  UA: Patient did not void in office today  PVR: 0 ml  ASSESSMENT Right ureteral stone - Plan: Urinalysis, Routine w reflex microscopic, BLADDER SCAN AMB NON-IMAGING  Postop check  Oliguria  End stage renal disease (HCC)  Hemodialysis patient (HCC)  Kidney stones - Plan: US  RENAL, DG Abd 1 View  We reviewed the operative procedures and findings.  He is doing well. Pre-operative symptoms are improved since the procedure.   Will plan to follow up in 6 months with KUB and RUS for stone surveillance or sooner if needed. Pt verbalized understanding and agreement. All questions were answered.  PLAN Advised the following: Return in about 6 months (around 02/04/2024) for RUS, KUB, UA, PVR, & f/u with Lauraine Oz NP.   Orders Placed This Encounter  Procedures   US  RENAL    Standing Status:   Future    Expected Date:   02/04/2024    Expiration Date:   08/06/2024    Reason for Exam (SYMPTOM  OR DIAGNOSIS REQUIRED):   kidney stone known or suspected    Preferred imaging location?:   [facility]   DG Abd 1 View    Standing Status:   Future    Expected Date:   02/04/2024     Expiration Date:   08/06/2024    Reason for Exam (SYMPTOM  OR DIAGNOSIS REQUIRED):   kidney stone    Preferred imaging location?:   [facility]   Urinalysis, Routine w reflex microscopic   BLADDER SCAN AMB NON-IMAGING    It has been explained that the patient is to follow regularly with their PCP in addition to all other providers involved in their care and to follow instructions provided by these respective offices. Patient advised to contact urology clinic if any urologic-pertaining questions, concerns, new symptoms or problems arise in the interim period.  There are no Patient Instructions on file for this visit.  Electronically signed by:  Lauraine KYM Oz, MSN, FNP-C, CUNP 08/07/2023 2:42 PM

## 2023-07-27 DIAGNOSIS — Z992 Dependence on renal dialysis: Secondary | ICD-10-CM | POA: Diagnosis not present

## 2023-07-27 DIAGNOSIS — N186 End stage renal disease: Secondary | ICD-10-CM | POA: Diagnosis not present

## 2023-07-27 DIAGNOSIS — D689 Coagulation defect, unspecified: Secondary | ICD-10-CM | POA: Diagnosis not present

## 2023-07-27 DIAGNOSIS — N2581 Secondary hyperparathyroidism of renal origin: Secondary | ICD-10-CM | POA: Diagnosis not present

## 2023-07-29 ENCOUNTER — Ambulatory Visit: Payer: PPO | Admitting: Nurse Practitioner

## 2023-07-31 LAB — CALCULI, WITH PHOTOGRAPH (CLINICAL LAB)
Calcium Oxalate Monohydrate: 100 %
Weight Calculi: 99 mg

## 2023-08-03 DIAGNOSIS — N2581 Secondary hyperparathyroidism of renal origin: Secondary | ICD-10-CM | POA: Diagnosis not present

## 2023-08-03 DIAGNOSIS — Z992 Dependence on renal dialysis: Secondary | ICD-10-CM | POA: Diagnosis not present

## 2023-08-03 DIAGNOSIS — N186 End stage renal disease: Secondary | ICD-10-CM | POA: Diagnosis not present

## 2023-08-03 DIAGNOSIS — D689 Coagulation defect, unspecified: Secondary | ICD-10-CM | POA: Diagnosis not present

## 2023-08-03 DIAGNOSIS — D631 Anemia in chronic kidney disease: Secondary | ICD-10-CM | POA: Diagnosis not present

## 2023-08-05 DIAGNOSIS — Z992 Dependence on renal dialysis: Secondary | ICD-10-CM | POA: Diagnosis not present

## 2023-08-05 DIAGNOSIS — E1122 Type 2 diabetes mellitus with diabetic chronic kidney disease: Secondary | ICD-10-CM | POA: Diagnosis not present

## 2023-08-05 DIAGNOSIS — N186 End stage renal disease: Secondary | ICD-10-CM | POA: Diagnosis not present

## 2023-08-07 ENCOUNTER — Ambulatory Visit: Payer: PPO | Admitting: Urology

## 2023-08-07 ENCOUNTER — Encounter: Payer: Self-pay | Admitting: Urology

## 2023-08-07 VITALS — BP 152/43 | HR 111

## 2023-08-07 DIAGNOSIS — N186 End stage renal disease: Secondary | ICD-10-CM

## 2023-08-07 DIAGNOSIS — Z87442 Personal history of urinary calculi: Secondary | ICD-10-CM

## 2023-08-07 DIAGNOSIS — R34 Anuria and oliguria: Secondary | ICD-10-CM | POA: Diagnosis not present

## 2023-08-07 DIAGNOSIS — Z992 Dependence on renal dialysis: Secondary | ICD-10-CM | POA: Diagnosis not present

## 2023-08-07 DIAGNOSIS — N200 Calculus of kidney: Secondary | ICD-10-CM | POA: Insufficient documentation

## 2023-08-07 DIAGNOSIS — Z09 Encounter for follow-up examination after completed treatment for conditions other than malignant neoplasm: Secondary | ICD-10-CM

## 2023-08-07 DIAGNOSIS — N201 Calculus of ureter: Secondary | ICD-10-CM

## 2023-08-07 LAB — BLADDER SCAN AMB NON-IMAGING: Scan Result: 0

## 2023-08-07 NOTE — Progress Notes (Signed)
 post void residual=0 ?

## 2023-08-08 DIAGNOSIS — Z992 Dependence on renal dialysis: Secondary | ICD-10-CM | POA: Diagnosis not present

## 2023-08-08 DIAGNOSIS — N2581 Secondary hyperparathyroidism of renal origin: Secondary | ICD-10-CM | POA: Diagnosis not present

## 2023-08-08 DIAGNOSIS — N186 End stage renal disease: Secondary | ICD-10-CM | POA: Diagnosis not present

## 2023-08-08 DIAGNOSIS — D689 Coagulation defect, unspecified: Secondary | ICD-10-CM | POA: Diagnosis not present

## 2023-08-11 DIAGNOSIS — N2581 Secondary hyperparathyroidism of renal origin: Secondary | ICD-10-CM | POA: Diagnosis not present

## 2023-08-11 DIAGNOSIS — N186 End stage renal disease: Secondary | ICD-10-CM | POA: Diagnosis not present

## 2023-08-11 DIAGNOSIS — Z992 Dependence on renal dialysis: Secondary | ICD-10-CM | POA: Diagnosis not present

## 2023-08-11 DIAGNOSIS — D689 Coagulation defect, unspecified: Secondary | ICD-10-CM | POA: Diagnosis not present

## 2023-08-11 DIAGNOSIS — E876 Hypokalemia: Secondary | ICD-10-CM | POA: Diagnosis not present

## 2023-08-12 ENCOUNTER — Other Ambulatory Visit: Payer: PPO

## 2023-08-15 ENCOUNTER — Emergency Department (HOSPITAL_COMMUNITY): Payer: PPO

## 2023-08-15 ENCOUNTER — Encounter (HOSPITAL_COMMUNITY): Payer: Self-pay

## 2023-08-15 ENCOUNTER — Emergency Department (HOSPITAL_COMMUNITY)
Admission: EM | Admit: 2023-08-15 | Discharge: 2023-08-15 | Disposition: A | Discharge status: Home / Self Care | Payer: PPO | Attending: Emergency Medicine | Admitting: Emergency Medicine

## 2023-08-15 ENCOUNTER — Other Ambulatory Visit: Payer: Self-pay

## 2023-08-15 DIAGNOSIS — E1140 Type 2 diabetes mellitus with diabetic neuropathy, unspecified: Secondary | ICD-10-CM | POA: Insufficient documentation

## 2023-08-15 DIAGNOSIS — K7689 Other specified diseases of liver: Secondary | ICD-10-CM | POA: Diagnosis not present

## 2023-08-15 DIAGNOSIS — Z992 Dependence on renal dialysis: Secondary | ICD-10-CM | POA: Diagnosis not present

## 2023-08-15 DIAGNOSIS — F32A Depression, unspecified: Secondary | ICD-10-CM | POA: Diagnosis not present

## 2023-08-15 DIAGNOSIS — N186 End stage renal disease: Secondary | ICD-10-CM | POA: Diagnosis not present

## 2023-08-15 DIAGNOSIS — I959 Hypotension, unspecified: Secondary | ICD-10-CM | POA: Diagnosis not present

## 2023-08-15 DIAGNOSIS — R059 Cough, unspecified: Secondary | ICD-10-CM | POA: Diagnosis not present

## 2023-08-15 DIAGNOSIS — E1122 Type 2 diabetes mellitus with diabetic chronic kidney disease: Secondary | ICD-10-CM | POA: Diagnosis not present

## 2023-08-15 DIAGNOSIS — R443 Hallucinations, unspecified: Secondary | ICD-10-CM | POA: Insufficient documentation

## 2023-08-15 DIAGNOSIS — R531 Weakness: Secondary | ICD-10-CM | POA: Diagnosis not present

## 2023-08-15 DIAGNOSIS — Z794 Long term (current) use of insulin: Secondary | ICD-10-CM | POA: Diagnosis not present

## 2023-08-15 DIAGNOSIS — R0602 Shortness of breath: Secondary | ICD-10-CM | POA: Diagnosis not present

## 2023-08-15 DIAGNOSIS — I672 Cerebral atherosclerosis: Secondary | ICD-10-CM | POA: Diagnosis not present

## 2023-08-15 DIAGNOSIS — K8689 Other specified diseases of pancreas: Secondary | ICD-10-CM | POA: Diagnosis not present

## 2023-08-15 DIAGNOSIS — F29 Unspecified psychosis not due to a substance or known physiological condition: Secondary | ICD-10-CM | POA: Diagnosis not present

## 2023-08-15 LAB — CBC WITH DIFFERENTIAL/PLATELET
Abs Immature Granulocytes: 0.1 10*3/uL — ABNORMAL HIGH (ref 0.00–0.07)
Basophils Absolute: 0 10*3/uL (ref 0.0–0.1)
Basophils Relative: 0 %
Eosinophils Absolute: 0 10*3/uL (ref 0.0–0.5)
Eosinophils Relative: 0 %
HCT: 30.3 % — ABNORMAL LOW (ref 39.0–52.0)
Hemoglobin: 9.4 g/dL — ABNORMAL LOW (ref 13.0–17.0)
Immature Granulocytes: 1 %
Lymphocytes Relative: 7 %
Lymphs Abs: 0.9 10*3/uL (ref 0.7–4.0)
MCH: 33.2 pg (ref 26.0–34.0)
MCHC: 31 g/dL (ref 30.0–36.0)
MCV: 107.1 fL — ABNORMAL HIGH (ref 80.0–100.0)
Monocytes Absolute: 0.8 10*3/uL (ref 0.1–1.0)
Monocytes Relative: 6 %
Neutro Abs: 11.5 10*3/uL — ABNORMAL HIGH (ref 1.7–7.7)
Neutrophils Relative %: 86 %
Platelets: 183 10*3/uL (ref 150–400)
RBC: 2.83 MIL/uL — ABNORMAL LOW (ref 4.22–5.81)
RDW: 14.2 % (ref 11.5–15.5)
WBC: 13.3 10*3/uL — ABNORMAL HIGH (ref 4.0–10.5)
nRBC: 0 % (ref 0.0–0.2)

## 2023-08-15 LAB — COMPREHENSIVE METABOLIC PANEL
ALT: 19 U/L (ref 0–44)
AST: 17 U/L (ref 15–41)
Albumin: 2.1 g/dL — ABNORMAL LOW (ref 3.5–5.0)
Alkaline Phosphatase: 250 U/L — ABNORMAL HIGH (ref 38–126)
Anion gap: 8 (ref 5–15)
BUN: 15 mg/dL (ref 8–23)
CO2: 27 mmol/L (ref 22–32)
Calcium: 8.5 mg/dL — ABNORMAL LOW (ref 8.9–10.3)
Chloride: 100 mmol/L (ref 98–111)
Creatinine, Ser: 1.48 mg/dL — ABNORMAL HIGH (ref 0.61–1.24)
GFR, Estimated: 47 mL/min — ABNORMAL LOW (ref >60)
Glucose, Bld: 191 mg/dL — ABNORMAL HIGH (ref 70–99)
Potassium: 3.9 mmol/L (ref 3.5–5.1)
Sodium: 135 mmol/L (ref 135–145)
Total Bilirubin: 0.8 mg/dL (ref 0.0–1.2)
Total Protein: 5.7 g/dL — ABNORMAL LOW (ref 6.5–8.1)

## 2023-08-15 LAB — CBG MONITORING, ED: Glucose-Capillary: 184 mg/dL — ABNORMAL HIGH (ref 70–99)

## 2023-08-15 LAB — ACETAMINOPHEN LEVEL: Acetaminophen (Tylenol), Serum: 15 ug/mL (ref 10–30)

## 2023-08-15 LAB — ETHANOL: Alcohol, Ethyl (B): <10 mg/dL (ref <10)

## 2023-08-15 LAB — SALICYLATE LEVEL: Salicylate Lvl: <7 mg/dL — ABNORMAL LOW (ref 7.0–30.0)

## 2023-08-15 MED ORDER — IOHEXOL 300 MG/ML  SOLN
100.0000 mL | Freq: Once | INTRAMUSCULAR | Status: AC | PRN
  Administered 2023-08-15: 80 mL via INTRAVENOUS

## 2023-08-15 MED ORDER — SERTRALINE HCL 50 MG PO TABS: 50.0000 mg | ORAL_TABLET | Freq: Every day | ORAL | 1 refills | Status: DC

## 2023-08-15 NOTE — ED Notes (Signed)
 Per EDP pt does not need a sitter for safety.

## 2023-08-15 NOTE — ED Notes (Signed)
 Attempted in and out cath with no urine return. Bladder scan showing 0 ml, MD notified.

## 2023-08-15 NOTE — ED Provider Notes (Signed)
 [place] EMERGENCY DEPARTMENT AT [facility] Provider Note   CSN: [id number] Arrival date & time: 08/15/23  1448     History  Chief Complaint  Patient presents with   Weakness    Mark L Hamm Sr. is a 83 y.o. male.   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  [name] was admitted to the hospital approximately 2-1/2 months ago with sepsis due to cystitis, dialysis notes going back several years have been seen.  The patient arrives by transport of EMS from the dialysis center because the patient started to have hallucinations with his general weakness over the last week, feels like this is getting worse, [name] actually states that the weakness has been progressive over a couple of weeks and coincides with having no appetite.  [name] did get a successful complete dialysis session today prior to being transferred.  There is no fevers no vomiting and no diarrhea.  [name] reports that [name] is seeing people that are not there, they are both black and white, male and male but they do not talk to him and [name] is not scared of them, there is no command or auditory hallucinations and [name] does not feel like [name] is a danger to himself or others.  [name] lives with his wife, states they have a good relationship, [name] does not think [name] would try to hurt himself.  [name] denies alcohol  or tobacco use  The patient believes [name] likely has pneumonia because [name] has been short of breath and coughing for a couple of weeks, [name] is taking over-the-counter cough and cold medications like Robitussin and actually pulled it out of his pocket while were talking.     Home Medications Prior to  Admission medications   Medication Sig Start Date End Date Taking? Authorizing Provider  sertraline  (ZOLOFT ) 50 MG tablet Take 1 tablet (50 mg total) by mouth daily. 08/15/23  Yes Cleotilde Rogue, MD  Continuous Glucose Sensor (FREESTYLE LIBRE 3 PLUS SENSOR) MISC Change sensor every 15 days. 06/02/23   Duanne Butler DASEN, MD  diazepam  (VALIUM ) 2 MG tablet Take 1 tablet (2 mg total) by mouth at bedtime. 07/18/23   Duanne Butler DASEN, MD  diltiazem  (CARDIZEM  CD) 240 MG 24 hr capsule TAKE 1 CAPSULE(240 MG) BY MOUTH DAILY 09/30/22   Duanne Butler DASEN, MD  HYDROcodone -acetaminophen  (NORCO/VICODIN) 5-325 MG tablet Take 1 tablet by mouth every 12 (twelve) hours as needed for severe pain (pain score 7-10). Must last 30 days. 08/11/23 09/10/23  Marcelino Nurse, MD  HYDROcodone -acetaminophen  (NORCO/VICODIN) 5-325 MG tablet Take 1 tablet by mouth every 12 (twelve) hours as needed for severe pain (pain score 7-10). Must last 30 days. 09/10/23 10/10/23  Marcelino Nurse, MD  HYDROcodone -acetaminophen  (NORCO/VICODIN) 5-325 MG tablet Take 1 tablet by mouth every 12 (twelve) hours as needed for severe pain (pain score 7-10). Must last 30 days. 07/24/23 08/23/23  Sherrilee Belvie CROME, MD  insulin  aspart (NOVOLOG ) 100 UNIT/ML injection Inject 8 Units into the skin 3 (three) times daily with meals. Patient taking differently: Inject 2 Units into the skin in the morning. Takes on dialysis days 02/06/21   Maree Bracken D, DO  insulin  glargine (LANTUS ) 100 UNIT/ML  injection Inject 0.12 mLs (12 Units total) into the skin at bedtime. Patient taking differently: Inject 6 Units into the skin in the morning. Takes on days [name] doesn't have dialysis; Austin Debar Th, Sat 03/20/21   Duanne Butler DASEN, MD  lidocaine -prilocaine  (EMLA ) cream APPLY SMALL AMOUNT TO ACCESS SITE (AVF) 30 MINUTES BEFORE DIALYSIS. COVER WITH OCCLUSIVE DRESSING (SARAN WRAP) 04/21/23   Duanne Butler DASEN, MD  pravastatin  (PRAVACHOL ) 10 MG tablet TAKE 1 TABLET(10 MG) BY MOUTH DAILY WITH  BREAKFAST 03/14/23   Duanne Butler DASEN, MD  sevelamer  carbonate (RENVELA ) 800 MG tablet Take 2,400 mg by mouth 3 (three) times daily with meals. 05/09/23   [provider]  sulfamethoxazole -trimethoprim  (BACTRIM  DS) 800-160 MG tablet Take 1 tablet by mouth 2 (two) times daily. 07/18/23   McKenzie, Belvie CROME, MD      Allergies    Enalapril maleate    Review of Systems   Review of Systems  Neurological:  Positive for weakness.  All other systems reviewed and are negative.   Physical Exam Updated Vital Signs BP 138/61   Pulse 88   Temp 98.5 F (36.9 C) (Oral)   Resp 16   Ht 1.727 m (5' 8)   Wt 65.6 kg   SpO2 95%   BMI 21.99 kg/m  Physical Exam Vitals and nursing note reviewed.  Constitutional:      General: [name] is not in acute distress.    Appearance: [name] is well-developed.  HENT:     Head: Normocephalic and atraumatic.     Mouth/Throat:     Pharynx: No oropharyngeal exudate.  Eyes:     General: No scleral icterus.       Right eye: No discharge.        Left eye: No discharge.     Conjunctiva/sclera: Conjunctivae normal.     Pupils: Pupils are equal, round, and reactive to light.  Neck:     Thyroid : No thyromegaly.     Vascular: No JVD.  Cardiovascular:     Rate and Rhythm: Normal rate and regular rhythm.     Heart sounds: Normal heart sounds. No murmur heard.    No friction rub. No gallop.  Pulmonary:     Effort: Pulmonary effort is normal. No respiratory distress.     Breath sounds: Rhonchi present. No wheezing or rales.  Abdominal:     General: Bowel sounds are normal. There is no distension.     Palpations: Abdomen is soft. There is no mass.     Tenderness: There is no abdominal tenderness.  Musculoskeletal:        General: No tenderness. Normal range of motion.     Cervical back: Normal range of motion and neck supple.     Right lower leg: No edema.     Left lower leg: No edema.  Lymphadenopathy:     Cervical: No cervical adenopathy.  Skin:     General: Skin is warm and dry.     Findings: No erythema or rash.  Neurological:     Mental Status: [name] is alert.     Coordination: Coordination normal.     Comments: Awake, alert, follows commands, able to sit up in the bed  Psychiatric:        Behavior: Behavior normal.     Comments: The patient has no active hallucinations, [name] states [name] has no auditory and is not having visual hallucinations at this time, [name] is not suicidal, homicidal or using any substances.  [name] does  not appear to be manic, [name] answers questions in a normal tone, [name] appears appropriately dressed     ED Results / Procedures / Treatments   Labs (all labs ordered are listed, but only abnormal results are displayed) Labs Reviewed  COMPREHENSIVE METABOLIC PANEL - Abnormal; Notable for the following components:      Result Value   Glucose, Bld 191 (*)    Creatinine, Ser 1.48 (*)    Calcium  8.5 (*)    Total Protein 5.7 (*)    Albumin  2.1 (*)    Alkaline Phosphatase 250 (*)    GFR, Estimated 47 (*)    All other components within normal limits  CBC WITH DIFFERENTIAL/PLATELET - Abnormal; Notable for the following components:   WBC 13.3 (*)    RBC 2.83 (*)    Hemoglobin 9.4 (*)    HCT 30.3 (*)    MCV 107.1 (*)    Neutro Abs 11.5 (*)    Abs Immature Granulocytes 0.10 (*)    All other components within normal limits  SALICYLATE LEVEL - Abnormal; Notable for the following components:   Salicylate Lvl <7.0 (*)    All other components within normal limits  CBG MONITORING, ED - Abnormal; Notable for the following components:   Glucose-Capillary 184 (*)    All other components within normal limits  ETHANOL  ACETAMINOPHEN  LEVEL  URINALYSIS, ROUTINE W REFLEX MICROSCOPIC    EKG EKG Interpretation Date/Time:  Friday August 15 2023 15:56:40 EST Ventricular Rate:  92 PR Interval:  214 QRS Duration:  121 QT Interval:  373 QTC Calculation: 462 R Axis:   -21  Text Interpretation: Sinus rhythm Atrial premature complex  Borderline prolonged PR interval Right bundle branch block Confirmed by Cleotilde Rogue ([id number]) on 08/15/2023 3:59:53 PM  Radiology CT ABDOMEN PELVIS W CONTRAST Result Date: 08/15/2023 CLINICAL DATA:  Right flank pain.  Recent right ureteral calculi. EXAM: CT ABDOMEN AND PELVIS WITH CONTRAST TECHNIQUE: Multidetector CT imaging of the abdomen and pelvis was performed using the standard protocol following bolus administration of intravenous contrast. RADIATION DOSE REDUCTION: This exam was performed according to the departmental dose-optimization program which includes automated exposure control, adjustment of the mA and/or kV according to patient size and/or use of iterative reconstruction technique. CONTRAST:  80mL OMNIPAQUE  IOHEXOL  300 MG/ML  SOLN COMPARISON:  05/28/2023 FINDINGS: Lower Chest: Mild bibasilar atelectasis versus scarring. Hepatobiliary: No suspicious hepatic masses identified. Stable tiny cyst in anterior liver dome. Prior cholecystectomy. No evidence of biliary obstruction. Pancreas: Diffuse pancreatic atrophy again noted. No mass or inflammatory changes. Spleen: Within normal limits in size and appearance. Adrenals/Urinary Tract: Bilateral renal parenchymal atrophy again noted. No suspicious renal masses identified. Right hydronephrosis has resolved since prior exam no evidence of ureteral calculi, although distal ureters and bladder are obscured by severe artifact from bilateral hip prostheses. Stomach/Bowel: No evidence of obstruction, inflammatory process or abnormal fluid collections. Vascular/Lymphatic: No pathologically enlarged lymph nodes. No acute vascular findings. Reproductive: Limited visualization noted due to severe artifact from bilateral hip prostheses. Other:  None. Musculoskeletal:  No suspicious bone lesions identified. IMPRESSION: No acute findings. Interval resolution of right hydronephrosis. No ureteral calculi or dilatation seen, although distal ureters and bladder are  obscured by severe artifact from bilateral hip prostheses. Electronically Signed   By: Norleen DELENA Kil M.D.   On: 08/15/2023 17:50   CT Head Wo Contrast Result Date: 08/15/2023 CLINICAL DATA:  Psychosis, hallucinations EXAM: CT HEAD WITHOUT CONTRAST TECHNIQUE: Contiguous axial images were obtained from the base  of the skull through the vertex without intravenous contrast. RADIATION DOSE REDUCTION: This exam was performed according to the departmental dose-optimization program which includes automated exposure control, adjustment of the mA and/or kV according to patient size and/or use of iterative reconstruction technique. COMPARISON:  02/02/2021 FINDINGS: Brain: No evidence of acute infarction, hemorrhage, mass, mass effect, or midline shift. No hydrocephalus or extra-axial fluid collection. Age related cerebral volume loss. Periventricular white matter changes, likely the sequela of chronic small vessel ischemic disease. Vascular: No hyperdense vessel. Atherosclerotic calcifications in the intracranial carotid and vertebral arteries. Skull: Negative for fracture or focal lesion. Sinuses/Orbits: Mucous retention cyst in the right maxillary sinus. Air-fluid level in the left maxillary sinus. Mild mucosal thickening in the inferior right frontal sinus and ethmoid air cells. Status post bilateral lens replacements. Other: Trace fluid in the mastoid air cells. IMPRESSION: No acute intracranial process. Electronically Signed   By: Donald Campion M.D.   On: 08/15/2023 16:53   DG Chest Port 1 View Result Date: 08/15/2023 CLINICAL DATA:  Cough and shortness of breath. EXAM: PORTABLE CHEST 1 VIEW COMPARISON:  05/28/2023 FINDINGS: Stable normal sized heart and elevated right hemidiaphragm. No interval airspace consolidation. Diffuse osteopenia. Cholecystectomy clips. IMPRESSION: No acute abnormality. Electronically Signed   By: Elspeth Bathe M.D.   On: 08/15/2023 15:40    Procedures Procedures    Medications Ordered  in ED Medications  iohexol  (OMNIPAQUE ) 300 MG/ML solution 100 mL (80 mLs Intravenous Contrast Given 08/15/23 1716)    ED Course/ Medical Decision Making/ A&P                                 Medical Decision Making Amount and/or Complexity of Data Reviewed Labs: ordered. Radiology: ordered.  Risk Prescription drug management.    This patient presents to the ED for concern of hallucinations and weakness, this involves an extensive number of treatment options, and is a complaint that carries with it a high risk of complications and morbidity.  The differential diagnosis includes recent stent and UTIs, may be infectious, could be related to progressive dialysis and renal failure, the patient is 83 years old and certainly appears to be aging and likely more rapidly as [name] has been in renal failure.  [name] has also been coughing and short of breath will need an x-ray to rule out pneumonia   Co morbidities that complicate the patient evaluation  Recent ureteral stent, end-stage renal disease   Additional history obtained:  Additional history obtained from medical record External records from outside source obtained and reviewed including dialysis notes and admission from October   Lab Tests:  I Ordered, and personally interpreted labs.  The pertinent results include:  labs normal - other than slightly elevated WBC count.  Lytes OK, no Urine in bladder (after dialysis)   Imaging Studies ordered:  I ordered imaging studies including chest xray (no pneumonia), CT head - no stroke, CT abd / pelvis - no acute findings.  I independently visualized and interpreted imaging which showed no acute findigns. I agree with the radiologist interpretation   Cardiac Monitoring: / EKG:  The patient was maintained on a cardiac monitor.  I personally viewed and interpreted the cardiac monitored which showed an underlying rhythm of: afib.   Consultations Obtained:  I requested consultation with  the wife - she agrees with patients assessment of progressive depression and mild hallucinations (only when eyes are closed),  and  discussed lab and imaging findings as well as pertinent plan - they are in agreement for sertraline  and f/u.   Problem List / ED Course / Critical interventions / Medication management  Is well-appearing, vitals normal, no fever, no tachycardia, slow process, likely in part related to mild malnutrition as well as to progressive depression I ordered medication including sertraline  as an outpatient for depression, does not appear to be renally dosed Reevaluation of the patient after these medicines showed that the patient stable I have reviewed the patients home medicines and have made adjustments as needed   Social Determinants of Health:  End-stage renal disease on dialysis   Test / Admission - Considered:  At admission but no signs of stroke or other severe pathological disease that would require admission or stabilizing care         Final Clinical Impression(s) / ED Diagnoses Final diagnoses:  Hallucination  Weakness    Rx / DC Orders ED Discharge Orders          Ordered    sertraline  (ZOLOFT ) 50 MG tablet  Daily        08/15/23 1906              Cleotilde Rogue, MD 08/15/23 1911

## 2023-08-15 NOTE — ED Triage Notes (Signed)
 Pt BIB ems from [facility] for hallucinations. Per EMS pt has had weakness for weeks. Pt having depression with none violent hallucinations. Pt did receive full dialysis today. Pt believes he has pneumonia but has not seen his PCP or any other medical provider.

## 2023-08-15 NOTE — Discharge Instructions (Signed)
 Your testing today has not shown any specific abnormalities that would cause you to be weak.  I suspect that some of the weakness comes from taking Benadryl occasionally, diazepam  occasionally (this is Valium ).  I do not want you taking strong pain medications such as hydrocodone  which can also cause severe weakness as well.  It is very dangerous to take diazepam  and hydrocodone  together.  You may start taking sertraline  1 tablet/day to help with depression, you will need to follow-up with your family doctor this week for a recheck, I did send them a note to let them know what was going on.  Thank you for allowing us  to treat you in the emergency department today.  After reviewing your examination and potential testing that was done it appears that you are safe to go home.  I would like for you to follow-up with your doctor within the next several days, have them obtain your records and follow-up with them to review all potential tests and results from your visit.  If you should develop severe or worsening symptoms return to the emergency department immediately

## 2023-08-18 DIAGNOSIS — D689 Coagulation defect, unspecified: Secondary | ICD-10-CM | POA: Diagnosis not present

## 2023-08-18 DIAGNOSIS — Z992 Dependence on renal dialysis: Secondary | ICD-10-CM | POA: Diagnosis not present

## 2023-08-18 DIAGNOSIS — N2581 Secondary hyperparathyroidism of renal origin: Secondary | ICD-10-CM | POA: Diagnosis not present

## 2023-08-18 DIAGNOSIS — N186 End stage renal disease: Secondary | ICD-10-CM | POA: Diagnosis not present

## 2023-08-18 DIAGNOSIS — E1122 Type 2 diabetes mellitus with diabetic chronic kidney disease: Secondary | ICD-10-CM | POA: Diagnosis not present

## 2023-08-19 ENCOUNTER — Ambulatory Visit: Payer: PPO

## 2023-08-21 ENCOUNTER — Ambulatory Visit: Payer: PPO | Admitting: Family Medicine

## 2023-08-21 ENCOUNTER — Encounter: Payer: Self-pay | Admitting: Family Medicine

## 2023-08-21 VITALS — BP 110/62 | HR 97 | Ht 68.0 in | Wt 143.0 lb

## 2023-08-21 DIAGNOSIS — F322 Major depressive disorder, single episode, severe without psychotic features: Secondary | ICD-10-CM

## 2023-08-21 MED ORDER — ARIPIPRAZOLE 2 MG PO TABS: 2.0000 mg | ORAL_TABLET | Freq: Every day | ORAL | 3 refills | Status: DC

## 2023-08-21 NOTE — Progress Notes (Signed)
Subjective:    Patient ID: Mark Bleak., male    DOB: [birth year], 83 y.o.   MRN: [id number]  HPI  Patient presents today with severe depression.  He reports feeling extremely sad.  His PHQ-9 score is 27.  He answers each question is a 3.  His GAD-7 score is 21.  He reports feeling anhedonia.  He reports lack of energy.  He reports no appetite.  He reports problems with concentration.  Recently in the emergency room due to visual commands.  He is seeing people that were not there.  He denies any visual hallucinations today.  He denies any auditory hallucinations.  He adamantly denies suicidal intention.  He states that he does not care if he is alive or dead but he has no plan to harm himself or anyone else.  He states that that dialysis has taken so much out of him.  He reports frequent pain.  All of this is contributing to his depression.  He was started on 50 mg of Zoloft with the emergency room.  Since starting the medication he does endorse nausea.  Although he states that he is feeling slightly better today.  I performed a Mini-Mental status exam.  The patient scored 25 out of 30.  He was able to correctly draw the clock.  Wife denies any recent memory problems. Past Medical History:  Diagnosis Date   Arthritis    lower back   Chronic back pain    Disc disease   Chronic kidney disease    Coronary atherosclerosis of native coronary artery    Coronary calcifications by chest CT, Myoview demonstrating inferolateral scar   Deafness in right ear    Diabetes mellitus    Diabetic retinopathy (HCC)    moderate nonproliferative with macular edema in right eye   Diastolic dysfunction 12/2011   Grade 1.   Essential hypertension, benign    Heart murmur    in past   HOH (hard of hearing)    Hyperkalemia    Kidney stones    Mixed hyperlipidemia    NAFLD (nonalcoholic fatty liver disease)    Pancreatic insufficiency    Diagnosed at [facility]   Prostate cancer [facility]) 2011   Shortness of  breath dyspnea    occasional - liver pressing on right lung, decreased capacity   Subdural hematoma (HCC)    Type 2 diabetes mellitus (HCC)    Wears hearing aid    left   Past Surgical History:  Procedure Laterality Date   A/V FISTULAGRAM Left 07/02/2023   Procedure: A/V Fistulagram;  Surgeon: Daria Pastures, MD;  Location: [facility] INVASIVE CV LAB;  Service: Vascular;  Laterality: Left;   APPENDECTOMY     AV FISTULA PLACEMENT Left 05/15/2021   Procedure: INSERTION OF LEFT ARM ARTERIOVENOUS (AV) GORE-TEX GRAFT;  Surgeon: Larina Earthly, MD;  Location: [facility];  Service: Vascular;  Laterality: Left;   Bilateral hip replacement     CATARACT EXTRACTION W/PHACO Left 05/15/2015   Procedure: CATARACT EXTRACTION PHACO AND INTRAOCULAR LENS PLACEMENT (IOC);  Surgeon: Sherald Hess, MD;  Location: [facility] SURGERY CNTR;  Service: Ophthalmology;  Laterality: Left;  DIABETIC - insulin pump and oral meds   CATARACT EXTRACTION W/PHACO Right 08/28/2015   Procedure: CATARACT EXTRACTION PHACO AND INTRAOCULAR LENS PLACEMENT (IOC);  Surgeon: Sherald Hess, MD;  Location: [facility] SURGERY CNTR;  Service: Ophthalmology;  Laterality: Right;  DIABETIC PER PT AND CINDY PLEASE KEEP ARRIVAL TIME AFTER 8AM  CHOLECYSTECTOMY     CYSTOSCOPY W/ URETERAL STENT PLACEMENT Right 05/29/2023   Procedure: CYSTOSCOPY WITH RETROGRADE PYELOGRAM/URETERAL STENT PLACEMENT;  Surgeon: Malen Gauze, MD;  Location: [facility];  Service: Urology;  Laterality: Right;   CYSTOSCOPY WITH RETROGRADE PYELOGRAM, URETEROSCOPY AND STENT PLACEMENT Right 07/24/2023   Procedure: CYSTOSCOPY WITH RETROGRADE PYELOGRAM, URETEROSCOPY AND STENT PLACEMENT;  Surgeon: Malen Gauze, MD;  Location: [facility];  Service: Urology;  Laterality: Right;  dialysis pt   HERNIA REPAIR     INSERTION OF DIALYSIS CATHETER Right 01/22/2021   Procedure: INSERTION OF DIALYSIS CATHETER;  Surgeon: Lucretia Roers, MD;  Location: [facility];  Service: General;   Laterality: Right;   INSERTION OF DIALYSIS CATHETER Right 04/16/2021   Procedure: INSERTION OF DIALYSIS CATHETER;  Surgeon: Lucretia Roers, MD;  Location: [facility];  Service: General;  Laterality: Right;   PERIPHERAL VASCULAR BALLOON ANGIOPLASTY  07/02/2023   Procedure: PERIPHERAL VASCULAR BALLOON ANGIOPLASTY;  Surgeon: Daria Pastures, MD;  Location: [facility] INVASIVE CV LAB;  Service: Vascular;;  lt arm fistula   PROSTATECTOMY  2011   STONE EXTRACTION WITH BASKET Right 07/24/2023   Procedure: STONE EXTRACTION WITH BASKET;  Surgeon: Malen Gauze, MD;  Location: [facility];  Service: Urology;  Laterality: Right;   Current Outpatient Medications on File Prior to Visit  Medication Sig Dispense Refill   Continuous Glucose Sensor (FREESTYLE LIBRE 3 PLUS SENSOR) MISC Change sensor every 15 days. 2 each 6   diltiazem (CARDIZEM CD) 240 MG 24 hr capsule TAKE 1 CAPSULE(240 MG) BY MOUTH DAILY 90 capsule 3   insulin aspart (NOVOLOG) 100 UNIT/ML injection Inject 8 Units into the skin 3 (three) times daily with meals. (Patient taking differently: Inject 2 Units into the skin in the morning. Takes on dialysis days) 10 mL 11   insulin glargine (LANTUS) 100 UNIT/ML injection Inject 0.12 mLs (12 Units total) into the skin at bedtime. (Patient taking differently: Inject 6 Units into the skin in the morning. Takes on days he doesn't have dialysis; Sun Tue Th, Sat) 10 mL 11   lidocaine-prilocaine (EMLA) cream APPLY SMALL AMOUNT TO ACCESS SITE (AVF) 30 MINUTES BEFORE DIALYSIS. COVER WITH OCCLUSIVE DRESSING (SARAN WRAP) 30 g 11   pravastatin (PRAVACHOL) 10 MG tablet TAKE 1 TABLET(10 MG) BY MOUTH DAILY WITH BREAKFAST 90 tablet 1   sertraline (ZOLOFT) 50 MG tablet Take 1 tablet (50 mg total) by mouth daily. 30 tablet 1   sevelamer carbonate (RENVELA) 800 MG tablet Take 2,400 mg by mouth 3 (three) times daily with meals.     sulfamethoxazole-trimethoprim (BACTRIM DS) 800-160 MG tablet Take 1 tablet by mouth 2 (two) times  daily. 14 tablet 0   diazepam (VALIUM) 2 MG tablet Take 1 tablet (2 mg total) by mouth at bedtime. (Patient not taking: Reported on 08/21/2023) 30 tablet 0   HYDROcodone-acetaminophen (NORCO/VICODIN) 5-325 MG tablet Take 1 tablet by mouth every 12 (twelve) hours as needed for severe pain (pain score 7-10). Must last 30 days. (Patient not taking: Reported on 08/21/2023) 60 tablet 0   [START ON 09/10/2023] HYDROcodone-acetaminophen (NORCO/VICODIN) 5-325 MG tablet Take 1 tablet by mouth every 12 (twelve) hours as needed for severe pain (pain score 7-10). Must last 30 days. (Patient not taking: Reported on 08/21/2023) 60 tablet 0   HYDROcodone-acetaminophen (NORCO/VICODIN) 5-325 MG tablet Take 1 tablet by mouth every 12 (twelve) hours as needed for severe pain (pain score 7-10). Must last 30 days. (Patient not taking: Reported on 08/21/2023) 15 tablet  0   No current facility-administered medications on file prior to visit.   Marland Kitchenall Social History   Socioeconomic History   Marital status: Married    Spouse name: [name]   Number of children: 3   Years of education: Not on file   Highest education level: Not on file  Occupational History   Not on file  Tobacco Use   Smoking status: Never   Smokeless tobacco: Never  Vaping Use   Vaping status: Never Used  Substance and Sexual Activity   Alcohol use: No   Drug use: No   Sexual activity: Yes  Other Topics Concern   Not on file  Social History Narrative   3 children, 1 daughter and 1 son sorry deceased. 1 daughter living in [place] and 1 step child   Married x 13 years 08/25/21.   7 grandchildren   1 great grandchild   Social Drivers of Corporate investment banker Strain: Low Risk  (08/22/2022)   Overall Financial Resource Strain (CARDIA)    Difficulty of Paying Living Expenses: Not hard at all  Food Insecurity: No Food Insecurity (06/05/2023)   Hunger Vital Sign    Worried About Running Out of Food in the Last Year: Never true    Ran Out of  Food in the Last Year: Never true  Transportation Needs: No Transportation Needs (06/05/2023)   PRAPARE - Administrator, Civil Service (Medical): No    Lack of Transportation (Non-Medical): No  Physical Activity: Inactive (08/22/2022)   Exercise Vital Sign    Days of Exercise per Week: 0 days    Minutes of Exercise per Session: 0 min  Stress: No Stress Concern Present (08/22/2022)   [employer] of Occupational Health - Occupational Stress Questionnaire    Feeling of Stress : Not at all  Social Connections: Moderately Isolated (08/22/2022)   Social Connection and Isolation Panel [NHANES]    Frequency of Communication with Friends and Family: More than three times a week    Frequency of Social Gatherings with Friends and Family: Three times a week    Attends Religious Services: Never    Active Member of Clubs or Organizations: No    Attends Banker Meetings: Never    Marital Status: Married  Catering manager Violence: Not At Risk (06/05/2023)   Humiliation, Afraid, Rape, and Kick questionnaire    Fear of Current or Ex-Partner: No    Emotionally Abused: No    Physically Abused: No    Sexually Abused: No      Review of Systems  All other systems reviewed and are negative.      Objective:   Physical Exam Constitutional:      General: He is not in acute distress.    Appearance: Normal appearance. He is normal weight. He is not ill-appearing or toxic-appearing.  Cardiovascular:     Rate and Rhythm: Normal rate. Rhythm irregular.     Heart sounds: Normal heart sounds.  Pulmonary:     Effort: Pulmonary effort is normal.     Breath sounds: Rales present.  Abdominal:     General: Abdomen is flat. Bowel sounds are normal.     Palpations: Abdomen is soft.  Musculoskeletal:     Right lower leg: No edema.     Left lower leg: No edema.  Skin:    Findings: No erythema or rash.  Neurological:     Mental Status: He is alert.  Assessment &  Plan:  Depression, major, single episode, severe (HCC) Continue Zoloft 50 mg a day but add Abilify 2 mg a day given his hallucinations and the severity of his symptoms.  Reassess in 1 week or sooner if worsening.  I plan to see him back weekly until his symptoms start to improve.

## 2023-08-25 DIAGNOSIS — Z992 Dependence on renal dialysis: Secondary | ICD-10-CM | POA: Diagnosis not present

## 2023-08-25 DIAGNOSIS — N2581 Secondary hyperparathyroidism of renal origin: Secondary | ICD-10-CM | POA: Diagnosis not present

## 2023-08-25 DIAGNOSIS — N186 End stage renal disease: Secondary | ICD-10-CM | POA: Diagnosis not present

## 2023-08-25 DIAGNOSIS — E876 Hypokalemia: Secondary | ICD-10-CM | POA: Diagnosis not present

## 2023-08-25 DIAGNOSIS — D689 Coagulation defect, unspecified: Secondary | ICD-10-CM | POA: Diagnosis not present

## 2023-08-28 ENCOUNTER — Encounter: Payer: Self-pay | Admitting: Family Medicine

## 2023-08-28 ENCOUNTER — Ambulatory Visit: Payer: PPO | Admitting: Family Medicine

## 2023-08-28 VITALS — BP 116/52 | HR 86 | Temp 98.2°F | Ht 68.0 in | Wt 143.0 lb

## 2023-08-28 DIAGNOSIS — F322 Major depressive disorder, single episode, severe without psychotic features: Secondary | ICD-10-CM | POA: Diagnosis not present

## 2023-08-28 DIAGNOSIS — K219 Gastro-esophageal reflux disease without esophagitis: Secondary | ICD-10-CM

## 2023-08-28 MED ORDER — PANTOPRAZOLE SODIUM 40 MG PO TBEC: 40.0000 mg | DELAYED_RELEASE_TABLET | Freq: Every day | ORAL | 3 refills | Status: DC

## 2023-08-28 MED ORDER — LIDOCAINE-PRILOCAINE 2.5-2.5 % EX CREA: TOPICAL_CREAM | CUTANEOUS | 11 refills | Status: DC

## 2023-08-28 NOTE — Progress Notes (Signed)
Subjective:    Patient ID: Mark Bleak., male    DOB: 04-Jan-1941, 82 y.o.   MRN: [id number]  HPI 08/21/23 Patient presents today with severe depression.  He reports feeling extremely sad.  His PHQ-9 score is 27.  He answers each question is a 3.  His GAD-7 score is 21.  He reports feeling anhedonia.  He reports lack of energy.  He reports no appetite.  He reports problems with concentration.  Recently in the emergency room due to visual commands.  He is seeing people that were not there.  He denies any visual hallucinations today.  He denies any auditory hallucinations.  He adamantly denies suicidal intention.  He states that he does not care if he is alive or dead but he has no plan to harm himself or anyone else.  He states that that dialysis has taken so much out of him.  He reports frequent pain.  All of this is contributing to his depression.  He was started on 50 mg of Zoloft with the emergency room.  Since starting the medication he does endorse nausea.  Although he states that he is feeling slightly better today.  I performed a Mini-Mental status exam.  The patient scored 25 out of 30.  He was able to correctly draw the clock.  Wife denies any recent memory problems.  At that time, my plan was: Continue Zoloft 50 mg a day but add Abilify 2 mg a day given his hallucinations and the severity of his symptoms.  Reassess in 1 week or sooner if worsening.  I plan to see him back weekly until his symptoms start to improve.  08/28/23 Patient is here today for follow-up.  He reports burning pain in the center of his chest.  He states that he feels acid in his throat.  As soon as he eats or drinks, he feels liquid reflux up to his throat and come out into his mouth and nose.  He is having trouble swallowing.  He feels nauseated.  From the standpoint of his depression, he may be doing some better.  He is starting to be more optimistic.  He states that he can see the light at the end of the tunnel.  He  still having tremendous trouble sleeping.  His wife states that he is doing a little bit better and that he is acting more like himself.  He seems to have a little bit more energy and a little more desire. Past Medical History:  Diagnosis Date   Arthritis    lower back   Chronic back pain    Disc disease   Chronic kidney disease    Coronary atherosclerosis of native coronary artery    Coronary calcifications by chest CT, Myoview demonstrating inferolateral scar   Deafness in right ear    Diabetes mellitus    Diabetic retinopathy (HCC)    moderate nonproliferative with macular edema in right eye   Diastolic dysfunction 12/2011   Grade 1.   Essential hypertension, benign    Heart murmur    in past   HOH (hard of hearing)    Hyperkalemia    Kidney stones    Mixed hyperlipidemia    NAFLD (nonalcoholic fatty liver disease)    Pancreatic insufficiency    Diagnosed at [facility]   Prostate cancer [facility]) 2011   Shortness of breath dyspnea    occasional - liver pressing on right lung, decreased capacity   Subdural hematoma (HCC)  Type 2 diabetes mellitus (HCC)    Wears hearing aid    left   Past Surgical History:  Procedure Laterality Date   A/V FISTULAGRAM Left 07/02/2023   Procedure: A/V Fistulagram;  Surgeon: Daria Pastures, MD;  Location: [facility] INVASIVE CV LAB;  Service: Vascular;  Laterality: Left;   APPENDECTOMY     AV FISTULA PLACEMENT Left 05/15/2021   Procedure: INSERTION OF LEFT ARM ARTERIOVENOUS (AV) GORE-TEX GRAFT;  Surgeon: Larina Earthly, MD;  Location: [facility];  Service: Vascular;  Laterality: Left;   Bilateral hip replacement     CATARACT EXTRACTION W/PHACO Left 05/15/2015   Procedure: CATARACT EXTRACTION PHACO AND INTRAOCULAR LENS PLACEMENT (IOC);  Surgeon: Sherald Hess, MD;  Location: [facility] SURGERY CNTR;  Service: Ophthalmology;  Laterality: Left;  DIABETIC - insulin pump and oral meds   CATARACT EXTRACTION W/PHACO Right 08/28/2015   Procedure: CATARACT  EXTRACTION PHACO AND INTRAOCULAR LENS PLACEMENT (IOC);  Surgeon: Sherald Hess, MD;  Location: [facility] SURGERY CNTR;  Service: Ophthalmology;  Laterality: Right;  DIABETIC PER PT AND CINDY PLEASE KEEP ARRIVAL TIME AFTER 8AM    CHOLECYSTECTOMY     CYSTOSCOPY W/ URETERAL STENT PLACEMENT Right 05/29/2023   Procedure: CYSTOSCOPY WITH RETROGRADE PYELOGRAM/URETERAL STENT PLACEMENT;  Surgeon: Malen Gauze, MD;  Location: [facility];  Service: Urology;  Laterality: Right;   CYSTOSCOPY WITH RETROGRADE PYELOGRAM, URETEROSCOPY AND STENT PLACEMENT Right 07/24/2023   Procedure: CYSTOSCOPY WITH RETROGRADE PYELOGRAM, URETEROSCOPY AND STENT PLACEMENT;  Surgeon: Malen Gauze, MD;  Location: [facility];  Service: Urology;  Laterality: Right;  dialysis pt   HERNIA REPAIR     INSERTION OF DIALYSIS CATHETER Right 01/22/2021   Procedure: INSERTION OF DIALYSIS CATHETER;  Surgeon: Lucretia Roers, MD;  Location: [facility];  Service: General;  Laterality: Right;   INSERTION OF DIALYSIS CATHETER Right 04/16/2021   Procedure: INSERTION OF DIALYSIS CATHETER;  Surgeon: Lucretia Roers, MD;  Location: [facility];  Service: General;  Laterality: Right;   PERIPHERAL VASCULAR BALLOON ANGIOPLASTY  07/02/2023   Procedure: PERIPHERAL VASCULAR BALLOON ANGIOPLASTY;  Surgeon: Daria Pastures, MD;  Location: [facility] INVASIVE CV LAB;  Service: Vascular;;  lt arm fistula   PROSTATECTOMY  2011   STONE EXTRACTION WITH BASKET Right 07/24/2023   Procedure: STONE EXTRACTION WITH BASKET;  Surgeon: Malen Gauze, MD;  Location: [facility];  Service: Urology;  Laterality: Right;   Current Outpatient Medications on File Prior to Visit  Medication Sig Dispense Refill   ARIPiprazole (ABILIFY) 2 MG tablet Take 1 tablet (2 mg total) by mouth daily. 30 tablet 3   Continuous Glucose Sensor (FREESTYLE LIBRE 3 PLUS SENSOR) MISC Change sensor every 15 days. 2 each 6   diltiazem (CARDIZEM CD) 240 MG 24 hr capsule TAKE 1 CAPSULE(240 MG) BY  MOUTH DAILY 90 capsule 3   HYDROcodone-acetaminophen (NORCO/VICODIN) 5-325 MG tablet Take 1 tablet by mouth every 12 (twelve) hours as needed for severe pain (pain score 7-10). Must last 30 days. 60 tablet 0   [START ON 09/10/2023] HYDROcodone-acetaminophen (NORCO/VICODIN) 5-325 MG tablet Take 1 tablet by mouth every 12 (twelve) hours as needed for severe pain (pain score 7-10). Must last 30 days. 60 tablet 0   insulin aspart (NOVOLOG) 100 UNIT/ML injection Inject 8 Units into the skin 3 (three) times daily with meals. (Patient taking differently: Inject 2 Units into the skin in the morning. Takes on dialysis days) 10 mL 11   insulin glargine (LANTUS) 100 UNIT/ML injection Inject 0.12 mLs (12  Units total) into the skin at bedtime. (Patient taking differently: Inject 6 Units into the skin in the morning. Takes on days he doesn't have dialysis; Sun Tue Th, Sat) 10 mL 11   pravastatin (PRAVACHOL) 10 MG tablet TAKE 1 TABLET(10 MG) BY MOUTH DAILY WITH BREAKFAST 90 tablet 1   sertraline (ZOLOFT) 50 MG tablet Take 1 tablet (50 mg total) by mouth daily. 30 tablet 1   sevelamer carbonate (RENVELA) 800 MG tablet Take 2,400 mg by mouth 3 (three) times daily with meals.     sulfamethoxazole-trimethoprim (BACTRIM DS) 800-160 MG tablet Take 1 tablet by mouth 2 (two) times daily. 14 tablet 0   diazepam (VALIUM) 2 MG tablet Take 1 tablet (2 mg total) by mouth at bedtime. (Patient not taking: Reported on 08/28/2023) 30 tablet 0   No current facility-administered medications on file prior to visit.   Marland Kitchenall Social History   Socioeconomic History   Marital status: Married    Spouse name: Darlene   Number of children: 3   Years of education: Not on file   Highest education level: Not on file  Occupational History   Not on file  Tobacco Use   Smoking status: Never   Smokeless tobacco: Never  Vaping Use   Vaping status: Never Used  Substance and Sexual Activity   Alcohol use: No   Drug use: No   Sexual  activity: Yes  Other Topics Concern   Not on file  Social History Narrative   3 children, 1 daughter and 1 son sorry deceased. 1 daughter living in [place] and 1 step child   Married x 13 years 08/25/21.   7 grandchildren   1 great grandchild   Social Drivers of Corporate investment banker Strain: Low Risk  (08/22/2022)   Overall Financial Resource Strain (CARDIA)    Difficulty of Paying Living Expenses: Not hard at all  Food Insecurity: No Food Insecurity (06/05/2023)   Hunger Vital Sign    Worried About Running Out of Food in the Last Year: Never true    Ran Out of Food in the Last Year: Never true  Transportation Needs: No Transportation Needs (06/05/2023)   PRAPARE - Administrator, Civil Service (Medical): No    Lack of Transportation (Non-Medical): No  Physical Activity: Inactive (08/22/2022)   Exercise Vital Sign    Days of Exercise per Week: 0 days    Minutes of Exercise per Session: 0 min  Stress: No Stress Concern Present (08/22/2022)   [employer] of Occupational Health - Occupational Stress Questionnaire    Feeling of Stress : Not at all  Social Connections: Moderately Isolated (08/22/2022)   Social Connection and Isolation Panel [NHANES]    Frequency of Communication with Friends and Family: More than three times a week    Frequency of Social Gatherings with Friends and Family: Three times a week    Attends Religious Services: Never    Active Member of Clubs or Organizations: No    Attends Banker Meetings: Never    Marital Status: Married  Catering manager Violence: Not At Risk (06/05/2023)   Humiliation, Afraid, Rape, and Kick questionnaire    Fear of Current or Ex-Partner: No    Emotionally Abused: No    Physically Abused: No    Sexually Abused: No      Review of Systems  All other systems reviewed and are negative.      Objective:   Physical Exam Constitutional:  General: He is not in acute distress.    Appearance:  Normal appearance. He is normal weight. He is not ill-appearing or toxic-appearing.  Cardiovascular:     Rate and Rhythm: Normal rate. Rhythm irregular.     Heart sounds: Normal heart sounds.  Pulmonary:     Effort: Pulmonary effort is normal.     Breath sounds: Rales present.  Abdominal:     General: Abdomen is flat. Bowel sounds are normal.     Palpations: Abdomen is soft.  Musculoskeletal:     Right lower leg: No edema.     Left lower leg: No edema.  Skin:    Findings: No erythema or rash.  Neurological:     Mental Status: He is alert.        Assessment & Plan:  Depression, major, single episode, severe (HCC)  Gastroesophageal reflux disease, unspecified whether esophagitis present Depression may be slightly better.  I have encouraged the patient to continue the current medications and we will reassess in 2 to 3 weeks.  I explained the medications take time to take effect but I am heartened by the fact he is feeling some better already.  I am concerned about the dysphagia.  I suspect the patient likely has swelling in his esophagus due to acid reflux.  Begin Protonix 40 mg daily.  If not improving, the patient may need to see GI for an EGD and possible dilatation of a stricture

## 2023-09-01 ENCOUNTER — Other Ambulatory Visit: Payer: Self-pay

## 2023-09-01 ENCOUNTER — Ambulatory Visit (HOSPITAL_COMMUNITY)
Admission: RE | Admit: 2023-09-01 | Discharge: 2023-09-01 | Disposition: A | Discharge status: Home / Self Care | Payer: PPO | Attending: Nephrology | Admitting: Nephrology

## 2023-09-01 ENCOUNTER — Encounter (HOSPITAL_COMMUNITY)
Admission: RE | Disposition: A | Discharge status: Home / Self Care | Payer: Self-pay | Source: Home / Self Care | Attending: Nephrology

## 2023-09-01 ENCOUNTER — Encounter (HOSPITAL_COMMUNITY): Payer: Self-pay | Admitting: Nephrology

## 2023-09-01 DIAGNOSIS — E1122 Type 2 diabetes mellitus with diabetic chronic kidney disease: Secondary | ICD-10-CM | POA: Diagnosis not present

## 2023-09-01 DIAGNOSIS — T82858A Stenosis of vascular prosthetic devices, implants and grafts, initial encounter: Secondary | ICD-10-CM | POA: Diagnosis not present

## 2023-09-01 DIAGNOSIS — Z992 Dependence on renal dialysis: Secondary | ICD-10-CM | POA: Diagnosis not present

## 2023-09-01 DIAGNOSIS — N186 End stage renal disease: Secondary | ICD-10-CM | POA: Diagnosis not present

## 2023-09-01 DIAGNOSIS — N200 Calculus of kidney: Secondary | ICD-10-CM | POA: Diagnosis not present

## 2023-09-01 DIAGNOSIS — I2510 Atherosclerotic heart disease of native coronary artery without angina pectoris: Secondary | ICD-10-CM | POA: Diagnosis not present

## 2023-09-01 DIAGNOSIS — Y832 Surgical operation with anastomosis, bypass or graft as the cause of abnormal reaction of the patient, or of later complication, without mention of misadventure at the time of the procedure: Secondary | ICD-10-CM | POA: Diagnosis not present

## 2023-09-01 DIAGNOSIS — T82868A Thrombosis of vascular prosthetic devices, implants and grafts, initial encounter: Secondary | ICD-10-CM | POA: Diagnosis not present

## 2023-09-01 DIAGNOSIS — N250 Renal osteodystrophy: Secondary | ICD-10-CM | POA: Diagnosis not present

## 2023-09-01 DIAGNOSIS — I5032 Chronic diastolic (congestive) heart failure: Secondary | ICD-10-CM | POA: Insufficient documentation

## 2023-09-01 DIAGNOSIS — I132 Hypertensive heart and chronic kidney disease with heart failure and with stage 5 chronic kidney disease, or end stage renal disease: Secondary | ICD-10-CM | POA: Diagnosis not present

## 2023-09-01 DIAGNOSIS — D631 Anemia in chronic kidney disease: Secondary | ICD-10-CM | POA: Insufficient documentation

## 2023-09-01 HISTORY — PX: PERIPHERAL VASCULAR THROMBECTOMY: CATH118306

## 2023-09-01 HISTORY — PX: PERIPHERAL VASCULAR BALLOON ANGIOPLASTY: CATH118281

## 2023-09-01 LAB — POCT I-STAT, CHEM 8
BUN: 103 mg/dL — ABNORMAL HIGH (ref 8–23)
Calcium, Ion: 0.97 mmol/L — ABNORMAL LOW (ref 1.15–1.40)
Chloride: 103 mmol/L (ref 98–111)
Creatinine, Ser: 6 mg/dL — ABNORMAL HIGH (ref 0.61–1.24)
Glucose, Bld: 170 mg/dL — ABNORMAL HIGH (ref 70–99)
HCT: 24 % — ABNORMAL LOW (ref 39.0–52.0)
Hemoglobin: 8.2 g/dL — ABNORMAL LOW (ref 13.0–17.0)
Potassium: 5.2 mmol/L — ABNORMAL HIGH (ref 3.5–5.1)
Sodium: 137 mmol/L (ref 135–145)
TCO2: 21 mmol/L — ABNORMAL LOW (ref 22–32)

## 2023-09-01 SURGERY — PERIPHERAL VASCULAR THROMBECTOMY
Anesthesia: LOCAL

## 2023-09-01 MED ORDER — HEPARIN SODIUM (PORCINE) 1000 UNIT/ML IJ SOLN
INTRAMUSCULAR | Status: AC
  Filled 2023-09-01: qty 10

## 2023-09-01 MED ORDER — MIDAZOLAM HCL 2 MG/2ML IJ SOLN
INTRAMUSCULAR | Status: DC | PRN
  Administered 2023-09-01: 1 mg via INTRAVENOUS

## 2023-09-01 MED ORDER — HEPARIN SODIUM (PORCINE) 1000 UNIT/ML IJ SOLN
INTRAMUSCULAR | Status: DC | PRN
  Administered 2023-09-01: 5000 [IU] via INTRAVENOUS

## 2023-09-01 MED ORDER — HEPARIN (PORCINE) IN NACL 1000-0.9 UT/500ML-% IV SOLN
INTRAVENOUS | Status: DC | PRN
  Administered 2023-09-01 (×2): 500 mL

## 2023-09-01 MED ORDER — FENTANYL CITRATE (PF) 100 MCG/2ML IJ SOLN
INTRAMUSCULAR | Status: DC | PRN
  Administered 2023-09-01: 25 ug via INTRAVENOUS

## 2023-09-01 MED ORDER — LIDOCAINE HCL (PF) 1 % IJ SOLN
INTRAMUSCULAR | Status: AC
  Filled 2023-09-01: qty 30

## 2023-09-01 MED ORDER — IODIXANOL 320 MG/ML IV SOLN
INTRAVENOUS | Status: DC | PRN
  Administered 2023-09-01: 15 mL via INTRAVENOUS

## 2023-09-01 MED ORDER — MIDAZOLAM HCL 2 MG/2ML IJ SOLN
INTRAMUSCULAR | Status: AC
  Filled 2023-09-01: qty 2

## 2023-09-01 MED ORDER — FENTANYL CITRATE (PF) 100 MCG/2ML IJ SOLN
INTRAMUSCULAR | Status: AC
  Filled 2023-09-01: qty 2

## 2023-09-01 MED ORDER — LIDOCAINE HCL (PF) 1 % IJ SOLN
INTRAMUSCULAR | Status: DC | PRN
  Administered 2023-09-01: 4 mL via SUBCUTANEOUS

## 2023-09-01 SURGICAL SUPPLY — 16 items
BAG SNAP BAND KOVER 36X36 (MISCELLANEOUS) IMPLANT
BALLN MUSTANG 6.0X40 75 (BALLOONS) ×2 IMPLANT
BALLN MUSTANG 8.0X40 75 (BALLOONS) ×2 IMPLANT
BALLOON FOGARTY 5FR 40 (CATHETERS) IMPLANT
BALLOON MUSTANG 6.0X40 75 (BALLOONS) IMPLANT
BALLOON MUSTANG 8.0X40 75 (BALLOONS) IMPLANT
CATH ANGIO 5F BER2 65CM (CATHETERS) IMPLANT
CATH FOGARTY BALL 5FR 40 (CATHETERS) ×2 IMPLANT
CATH STR 7FR 55 BRITE (CATHETERS) IMPLANT
COVER DOME SNAP 22 D (MISCELLANEOUS) IMPLANT
GLIDEWIRE ANGLED SS 035X260CM (WIRE) IMPLANT
GUIDEWIRE ANGLED .035X150CM (WIRE) IMPLANT
SHEATH PINNACLE 7F 10CM (SHEATH) IMPLANT
SHEATH PINNACLE R/O II 7F 4CM (SHEATH) IMPLANT
SYR MEDALLION 10ML (SYRINGE) IMPLANT
TRAY PV CATH (CUSTOM PROCEDURE TRAY) IMPLANT

## 2023-09-01 NOTE — Discharge Instructions (Signed)

## 2023-09-01 NOTE — Consult Note (Addendum)
Chief Complaint:  Thrombosed access  Interval H&P  The patient has presented today for an angiogram/ angioplasty.  Various methods of treatment have been discussed with the patient.  After consideration of risk, benefits and other options for treatment, the patient has consented to a angiogram/ angioplasty with  possible stent placement.   Risks of angiogram with potential angioplasty and stenting if needed.contrast reaction, extravasation/ bleeding, dissection, hypotension and death were explained to the patient.  The patient's history has been reviewed and the patient has been examined, no changes in status.  Stable for angiogram/angioplasty  I have reviewed the patient's chart and labs.  Questions were answered to the patient's satisfaction.  Assessment/Plan: ESRD - last dialysis was on on Tuesday 11/26 for a 4hr treatment; no absolute indication for emergent dialysis and may continue outpatient regimen at his center. He did have a skin tear that occurred end of treatment on Tuesday that has a slow ooze from the skin tear lateral to the graft. Thrombosed graft -attempt declot and if unsuccessful please catheter.  Will need labs as his last dialysis treatment was Wednesday.  Will send labs as soon as I gain access from running blood centrally.  He appears to be a very difficult stick on examination.  He states that Dr. Arbie Cookey placed his AV graft but I cannot find that op note. Renal osteodystrophy - continue binders. Anemia - per center protocol. HTN - continue home regimen. HFpEF - compensated H/o nephrolithiasis recently treated for an infected right sided stone.   HPI: Mark CYGAN Sr. is an 83 y.o. male with ESRD on HD, HTN, HLD, DM II, CASHD, HFpEF, nephrolithiasis referred for decreased flows. He was recently admitted for hypotension with a history of  hematuria and pneumaturia (referred previously to urology). He recently was diagnosed with a R infected stone.    ROS Pertinent  items are noted in HPI.  Social History:  reports that he has never smoked. He has never used smokeless tobacco. He reports that he does not drink alcohol and does not use drugs.   GEN older gentleman, NAD HEENT EOMI  NECK no JVD PULM clear CV irregular irregular ABD soft EXT no LE edema NEURO Aao x 3 SKIN thinned, some skin tears and burising ACCESS: l avg no bruit

## 2023-09-01 NOTE — Op Note (Signed)
Patient referred for a thrombectomy of his left upper arm AVG placed on 05/15/21 by Dr. Arbie Cookey.  He was just here and require 8 mm angioplasty of the intragraft lesion in the mid segment. On examination there is no pulse or bruit in the left upper arm arc graft. The left radial pulse is 1+.   Summary:  1) Successful thrombectomy of a left upper arm arc AVG with evidence of 70% mid-intragraft stenosis which was corrected with angioplasty (8 G FE ~15 ATM). 50% arterial anastomotic stenosis tx 6x4 Mustang PTA FE ~8 ATM. 2) 50% venous anastomosis was treated with a 8 mm balloon fully effaced approximately 10 atm of pressure.   3) Centrals and brachial artery are patent but very calcific.   4) This left upper arm arc graft remains amenable to future percutaneous intervention.  Description of procedure: The left arm was prepped and draped in the usual fashion. The left upper arm arc AVG was first cannulated (16109) in the arterial limb in an antegrade direction and then a 7Fr sheath was inserted by guidewire exchange technique. A 0.035 guidewire was passed through the clotted graft into the central veins under fluoroscopic guidance and then a 5Fr diagnostic catheter inserted over the wire. The wire was removed, and then intravenous heparin and sedative drugs administered. A pullback angiogram was performed by injecting contrast (U0454) via the diagnostic catheter. This showed patent central veins, patent left axillary vein, 50% venous anastomosis stenosis, 70% mid-intragraft stenosis and evidence of filling defects in the graft consistent with thrombus.   Three passes were made with the 7Fr [facility] 1 catheter from the axillary vein to the antegrade sheath to physically remove thrombus and perform the thrombectomy (09811). The catheter was removed, and an 8 x 8 Mustang angioplasty balloon was inserted over the wire to the level of the mid-intragraft lesion followed by venous angioplasty (91478) carried out to 10-15  ATM at only the site of stenosis to FULL effacement and then more gently in the rest of the graft circuit to macerate the thrombus.    In order to remove this clot and remove the platelet plug at the arterial anastomosis, a second cannulation (29562) was required in a retrograde direction. A 7Fr sheath was inserted by guidewire exchange technique. A 4Fr Fogarty catheter was inserted through the sheath and advanced into the proximal brachial artery. The over the wire embolectomy balloon was inflated with 0.6cc of contrast and then pulled across the arterial anastomosis into the graft with aspiration of the sheath via the side port to remove the platelet plug and thrombus. [phone]).  Because of the sharp angle of the embolectomy balloon would not track, but we were able to advance the embolectomy balloon over the guidewire into the distal artery and then performed inflow sweep.    There was difficulty getting inflow established until we finally advanced a 6 x 4 balloon to the level of the arterial anastomosis.  It was still very difficult to cross into the brachial artery but we were able to get almost the 6 mm segment to the anastomotic level and inflated gently at approximately 8 atm of pressure to full effacement.  Only after this was done was good inflow established.    This step was repeated to sweep all residual clot from this side of the graft. In order to check the arterial inflow an arteriogram was necessary because refluxing contrast from the graft would have caused risk of embolizing thrombus from the graft into the arterial  vasculature. A glidewire and straight catheter were inserted into the proximal brachial artery and an arteriogram performed by parking the tip of the catheter 2cm proximal to the arterial anastomosis. The arteriogram documented a good calibered proximal and distal brachial artery with slow distal runoff and no evidence of distal emboli; certainly steal physiology present.   I  then polished the entire outflow tract/graft circuit with the partially inflated 8mm angioplasty balloon to alleviate thrombus burden.  Completion outflow angiogram revealed rapid flows, no further retained thrombus burden, 10% residual stenosis at the mid-intragraft lesion with no dissection or extravasation. The graft had a good thrill and bruit.  Hemostasis: A 3-0 ethilon purse string suture was placed at the cannulation site on removal of the sheath.  Sedation: 1 mg Versed, 25 mcg Fentanyl. Sedation time. 52  minutes  Contrast. 15 mL  Monitoring: Because of the patient's comorbid conditions and sedation during the procedure, continuous EKG monitoring and O2 saturation monitoring was performed throughout the procedure by the RN. There were no abnormal arrhythmias encountered.  Complications: None.   Diagnoses: N18.6 End stage renal disease  T82.858A Stenosis of vascular prosthetic devices, implants and grafts, initial encounter T82.868A Thrombosis of vascular prosthetic device/graft, initial  Procedure Coding:  [phone] Cannulation and angiogram of fistula/ graft, thrombectomy and venous angioplasty X9147 Contrast  Recommendations:  1. Continue to cannulate the graft with 15G needles.  2. Refer back for problems with flows. 3. Remove the sutures next treatment.   Discharge: The patient was discharged home in stable condition. The patient was given education regarding the care of the dialysis access AVF and specific instructions in case of any problems.

## 2023-09-02 DIAGNOSIS — R110 Nausea: Secondary | ICD-10-CM | POA: Diagnosis not present

## 2023-09-02 DIAGNOSIS — N2581 Secondary hyperparathyroidism of renal origin: Secondary | ICD-10-CM | POA: Diagnosis not present

## 2023-09-02 DIAGNOSIS — N186 End stage renal disease: Secondary | ICD-10-CM | POA: Diagnosis not present

## 2023-09-02 DIAGNOSIS — D631 Anemia in chronic kidney disease: Secondary | ICD-10-CM | POA: Diagnosis not present

## 2023-09-02 DIAGNOSIS — R52 Pain, unspecified: Secondary | ICD-10-CM | POA: Diagnosis not present

## 2023-09-02 DIAGNOSIS — Z992 Dependence on renal dialysis: Secondary | ICD-10-CM | POA: Diagnosis not present

## 2023-09-02 DIAGNOSIS — D689 Coagulation defect, unspecified: Secondary | ICD-10-CM | POA: Diagnosis not present

## 2023-09-02 DIAGNOSIS — E876 Hypokalemia: Secondary | ICD-10-CM | POA: Diagnosis not present

## 2023-09-04 ENCOUNTER — Other Ambulatory Visit: Payer: Self-pay | Admitting: Family Medicine

## 2023-09-04 MED ORDER — SERTRALINE HCL 50 MG PO TABS: 50.0000 mg | ORAL_TABLET | Freq: Every day | ORAL | 3 refills | Status: DC

## 2023-09-05 DIAGNOSIS — Z992 Dependence on renal dialysis: Secondary | ICD-10-CM | POA: Diagnosis not present

## 2023-09-05 DIAGNOSIS — N186 End stage renal disease: Secondary | ICD-10-CM | POA: Diagnosis not present

## 2023-09-05 DIAGNOSIS — E1122 Type 2 diabetes mellitus with diabetic chronic kidney disease: Secondary | ICD-10-CM | POA: Diagnosis not present

## 2023-09-08 DIAGNOSIS — N2581 Secondary hyperparathyroidism of renal origin: Secondary | ICD-10-CM | POA: Diagnosis not present

## 2023-09-08 DIAGNOSIS — Z992 Dependence on renal dialysis: Secondary | ICD-10-CM | POA: Diagnosis not present

## 2023-09-08 DIAGNOSIS — D689 Coagulation defect, unspecified: Secondary | ICD-10-CM | POA: Diagnosis not present

## 2023-09-08 DIAGNOSIS — E876 Hypokalemia: Secondary | ICD-10-CM | POA: Diagnosis not present

## 2023-09-08 DIAGNOSIS — N186 End stage renal disease: Secondary | ICD-10-CM | POA: Diagnosis not present

## 2023-09-09 ENCOUNTER — Other Ambulatory Visit: Payer: PPO

## 2023-09-15 DIAGNOSIS — N2581 Secondary hyperparathyroidism of renal origin: Secondary | ICD-10-CM | POA: Diagnosis not present

## 2023-09-15 DIAGNOSIS — N186 End stage renal disease: Secondary | ICD-10-CM | POA: Diagnosis not present

## 2023-09-15 DIAGNOSIS — Z992 Dependence on renal dialysis: Secondary | ICD-10-CM | POA: Diagnosis not present

## 2023-09-15 DIAGNOSIS — R197 Diarrhea, unspecified: Secondary | ICD-10-CM | POA: Diagnosis not present

## 2023-09-15 DIAGNOSIS — D689 Coagulation defect, unspecified: Secondary | ICD-10-CM | POA: Diagnosis not present

## 2023-09-16 ENCOUNTER — Emergency Department (HOSPITAL_COMMUNITY): Payer: PPO

## 2023-09-16 ENCOUNTER — Other Ambulatory Visit: Payer: Self-pay

## 2023-09-16 ENCOUNTER — Encounter (HOSPITAL_COMMUNITY): Payer: Self-pay | Admitting: *Deleted

## 2023-09-16 ENCOUNTER — Inpatient Hospital Stay (HOSPITAL_COMMUNITY)
Admission: EM | Admit: 2023-09-16 | Discharge: 2023-09-23 | DRG: 391 | Disposition: A | Discharge status: Skilled Nursing Facility | Payer: PPO | Attending: Internal Medicine | Admitting: Internal Medicine

## 2023-09-16 DIAGNOSIS — Z8546 Personal history of malignant neoplasm of prostate: Secondary | ICD-10-CM

## 2023-09-16 DIAGNOSIS — Z1152 Encounter for screening for COVID-19: Secondary | ICD-10-CM

## 2023-09-16 DIAGNOSIS — I120 Hypertensive chronic kidney disease with stage 5 chronic kidney disease or end stage renal disease: Secondary | ICD-10-CM | POA: Diagnosis present

## 2023-09-16 DIAGNOSIS — D631 Anemia in chronic kidney disease: Secondary | ICD-10-CM | POA: Diagnosis present

## 2023-09-16 DIAGNOSIS — F32A Depression, unspecified: Secondary | ICD-10-CM | POA: Diagnosis present

## 2023-09-16 DIAGNOSIS — E1122 Type 2 diabetes mellitus with diabetic chronic kidney disease: Secondary | ICD-10-CM | POA: Diagnosis present

## 2023-09-16 DIAGNOSIS — R1110 Vomiting, unspecified: Secondary | ICD-10-CM

## 2023-09-16 DIAGNOSIS — Z992 Dependence on renal dialysis: Secondary | ICD-10-CM | POA: Diagnosis not present

## 2023-09-16 DIAGNOSIS — B3781 Candidal esophagitis: Secondary | ICD-10-CM | POA: Diagnosis present

## 2023-09-16 DIAGNOSIS — I10 Essential (primary) hypertension: Secondary | ICD-10-CM

## 2023-09-16 DIAGNOSIS — I6782 Cerebral ischemia: Secondary | ICD-10-CM | POA: Diagnosis not present

## 2023-09-16 DIAGNOSIS — E1169 Type 2 diabetes mellitus with other specified complication: Secondary | ICD-10-CM | POA: Diagnosis not present

## 2023-09-16 DIAGNOSIS — R636 Underweight: Secondary | ICD-10-CM | POA: Diagnosis present

## 2023-09-16 DIAGNOSIS — I471 Supraventricular tachycardia, unspecified: Secondary | ICD-10-CM | POA: Diagnosis present

## 2023-09-16 DIAGNOSIS — I700 Atherosclerosis of aorta: Secondary | ICD-10-CM | POA: Diagnosis not present

## 2023-09-16 DIAGNOSIS — R531 Weakness: Secondary | ICD-10-CM

## 2023-09-16 DIAGNOSIS — R002 Palpitations: Secondary | ICD-10-CM | POA: Diagnosis present

## 2023-09-16 DIAGNOSIS — E785 Hyperlipidemia, unspecified: Secondary | ICD-10-CM | POA: Diagnosis not present

## 2023-09-16 DIAGNOSIS — R9389 Abnormal findings on diagnostic imaging of other specified body structures: Secondary | ICD-10-CM | POA: Diagnosis not present

## 2023-09-16 DIAGNOSIS — K222 Esophageal obstruction: Secondary | ICD-10-CM | POA: Diagnosis not present

## 2023-09-16 DIAGNOSIS — N186 End stage renal disease: Secondary | ICD-10-CM | POA: Diagnosis not present

## 2023-09-16 DIAGNOSIS — Z833 Family history of diabetes mellitus: Secondary | ICD-10-CM

## 2023-09-16 DIAGNOSIS — Z7189 Other specified counseling: Secondary | ICD-10-CM

## 2023-09-16 DIAGNOSIS — R627 Adult failure to thrive: Secondary | ICD-10-CM | POA: Diagnosis present

## 2023-09-16 DIAGNOSIS — Z66 Do not resuscitate: Secondary | ICD-10-CM | POA: Diagnosis present

## 2023-09-16 DIAGNOSIS — F05 Delirium due to known physiological condition: Secondary | ICD-10-CM | POA: Diagnosis not present

## 2023-09-16 DIAGNOSIS — Z888 Allergy status to other drugs, medicaments and biological substances status: Secondary | ICD-10-CM

## 2023-09-16 DIAGNOSIS — R1310 Dysphagia, unspecified: Secondary | ICD-10-CM

## 2023-09-16 DIAGNOSIS — Z974 Presence of external hearing-aid: Secondary | ICD-10-CM

## 2023-09-16 DIAGNOSIS — Z79899 Other long term (current) drug therapy: Secondary | ICD-10-CM

## 2023-09-16 DIAGNOSIS — R739 Hyperglycemia, unspecified: Secondary | ICD-10-CM | POA: Diagnosis not present

## 2023-09-16 DIAGNOSIS — R1319 Other dysphagia: Principal | ICD-10-CM

## 2023-09-16 DIAGNOSIS — R296 Repeated falls: Secondary | ICD-10-CM | POA: Diagnosis present

## 2023-09-16 DIAGNOSIS — K317 Polyp of stomach and duodenum: Secondary | ICD-10-CM | POA: Diagnosis not present

## 2023-09-16 DIAGNOSIS — D696 Thrombocytopenia, unspecified: Secondary | ICD-10-CM | POA: Diagnosis present

## 2023-09-16 DIAGNOSIS — D649 Anemia, unspecified: Secondary | ICD-10-CM

## 2023-09-16 DIAGNOSIS — Z515 Encounter for palliative care: Secondary | ICD-10-CM

## 2023-09-16 DIAGNOSIS — Z794 Long term (current) use of insulin: Secondary | ICD-10-CM

## 2023-09-16 DIAGNOSIS — F419 Anxiety disorder, unspecified: Secondary | ICD-10-CM | POA: Diagnosis present

## 2023-09-16 DIAGNOSIS — E1142 Type 2 diabetes mellitus with diabetic polyneuropathy: Secondary | ICD-10-CM | POA: Diagnosis not present

## 2023-09-16 DIAGNOSIS — K3189 Other diseases of stomach and duodenum: Secondary | ICD-10-CM | POA: Diagnosis not present

## 2023-09-16 DIAGNOSIS — K219 Gastro-esophageal reflux disease without esophagitis: Secondary | ICD-10-CM

## 2023-09-16 DIAGNOSIS — L89152 Pressure ulcer of sacral region, stage 2: Secondary | ICD-10-CM | POA: Diagnosis present

## 2023-09-16 DIAGNOSIS — S0990XA Unspecified injury of head, initial encounter: Secondary | ICD-10-CM | POA: Diagnosis not present

## 2023-09-16 DIAGNOSIS — R519 Headache, unspecified: Secondary | ICD-10-CM | POA: Diagnosis not present

## 2023-09-16 DIAGNOSIS — Z9181 History of falling: Secondary | ICD-10-CM

## 2023-09-16 DIAGNOSIS — E782 Mixed hyperlipidemia: Secondary | ICD-10-CM | POA: Diagnosis present

## 2023-09-16 DIAGNOSIS — E11649 Type 2 diabetes mellitus with hypoglycemia without coma: Secondary | ICD-10-CM | POA: Diagnosis present

## 2023-09-16 DIAGNOSIS — R54 Age-related physical debility: Secondary | ICD-10-CM | POA: Diagnosis present

## 2023-09-16 DIAGNOSIS — R1314 Dysphagia, pharyngoesophageal phase: Secondary | ICD-10-CM | POA: Diagnosis present

## 2023-09-16 DIAGNOSIS — Z6820 Body mass index (BMI) 20.0-20.9, adult: Secondary | ICD-10-CM

## 2023-09-16 DIAGNOSIS — Z8249 Family history of ischemic heart disease and other diseases of the circulatory system: Secondary | ICD-10-CM

## 2023-09-16 DIAGNOSIS — I959 Hypotension, unspecified: Secondary | ICD-10-CM | POA: Diagnosis not present

## 2023-09-16 DIAGNOSIS — R059 Cough, unspecified: Secondary | ICD-10-CM | POA: Diagnosis not present

## 2023-09-16 DIAGNOSIS — I2510 Atherosclerotic heart disease of native coronary artery without angina pectoris: Secondary | ICD-10-CM | POA: Diagnosis present

## 2023-09-16 DIAGNOSIS — R748 Abnormal levels of other serum enzymes: Secondary | ICD-10-CM

## 2023-09-16 DIAGNOSIS — E1165 Type 2 diabetes mellitus with hyperglycemia: Secondary | ICD-10-CM | POA: Diagnosis not present

## 2023-09-16 DIAGNOSIS — H9191 Unspecified hearing loss, right ear: Secondary | ICD-10-CM | POA: Diagnosis present

## 2023-09-16 DIAGNOSIS — Z96643 Presence of artificial hip joint, bilateral: Secondary | ICD-10-CM | POA: Diagnosis present

## 2023-09-16 DIAGNOSIS — N2581 Secondary hyperparathyroidism of renal origin: Secondary | ICD-10-CM | POA: Diagnosis present

## 2023-09-16 LAB — IRON AND TIBC: Iron: 56 ug/dL (ref 45–182)

## 2023-09-16 LAB — COMPREHENSIVE METABOLIC PANEL
ALT: 34 U/L (ref 0–44)
AST: 35 U/L (ref 15–41)
Albumin: 1.6 g/dL — ABNORMAL LOW (ref 3.5–5.0)
Alkaline Phosphatase: 323 U/L — ABNORMAL HIGH (ref 38–126)
Anion gap: 11 (ref 5–15)
BUN: 35 mg/dL — ABNORMAL HIGH (ref 8–23)
CO2: 26 mmol/L (ref 22–32)
Calcium: 7.7 mg/dL — ABNORMAL LOW (ref 8.9–10.3)
Chloride: 96 mmol/L — ABNORMAL LOW (ref 98–111)
Creatinine, Ser: 2.21 mg/dL — ABNORMAL HIGH (ref 0.61–1.24)
GFR, Estimated: 29 mL/min — ABNORMAL LOW (ref >60)
Glucose, Bld: 152 mg/dL — ABNORMAL HIGH (ref 70–99)
Potassium: 4.4 mmol/L (ref 3.5–5.1)
Sodium: 133 mmol/L — ABNORMAL LOW (ref 135–145)
Total Bilirubin: 0.7 mg/dL (ref 0.0–1.2)
Total Protein: 4.5 g/dL — ABNORMAL LOW (ref 6.5–8.1)

## 2023-09-16 LAB — CBC WITH DIFFERENTIAL/PLATELET
Abs Immature Granulocytes: 0.06 10*3/uL (ref 0.00–0.07)
Basophils Absolute: 0 10*3/uL (ref 0.0–0.1)
Basophils Relative: 0 %
Eosinophils Absolute: 0.1 10*3/uL (ref 0.0–0.5)
Eosinophils Relative: 1 %
HCT: 25.4 % — ABNORMAL LOW (ref 39.0–52.0)
Hemoglobin: 7.8 g/dL — ABNORMAL LOW (ref 13.0–17.0)
Immature Granulocytes: 1 %
Lymphocytes Relative: 6 %
Lymphs Abs: 0.7 10*3/uL (ref 0.7–4.0)
MCH: 33.5 pg (ref 26.0–34.0)
MCHC: 30.7 g/dL (ref 30.0–36.0)
MCV: 109 fL — ABNORMAL HIGH (ref 80.0–100.0)
Monocytes Absolute: 0.6 10*3/uL (ref 0.1–1.0)
Monocytes Relative: 5 %
Neutro Abs: 9.2 10*3/uL — ABNORMAL HIGH (ref 1.7–7.7)
Neutrophils Relative %: 87 %
Platelets: 106 10*3/uL — ABNORMAL LOW (ref 150–400)
RBC: 2.33 MIL/uL — ABNORMAL LOW (ref 4.22–5.81)
RDW: 15.1 % (ref 11.5–15.5)
WBC: 10.6 10*3/uL — ABNORMAL HIGH (ref 4.0–10.5)
nRBC: 0 % (ref 0.0–0.2)

## 2023-09-16 LAB — RETICULOCYTES
Immature Retic Fract: 16.9 % — ABNORMAL HIGH (ref 2.3–15.9)
RBC.: 2.34 MIL/uL — ABNORMAL LOW (ref 4.22–5.81)
Retic Count, Absolute: 80.3 10*3/uL (ref 19.0–186.0)
Retic Ct Pct: 3.4 % — ABNORMAL HIGH (ref 0.4–3.1)

## 2023-09-16 LAB — RESP PANEL BY RT-PCR (RSV, FLU A&B, COVID)  RVPGX2
Influenza A by PCR: NEGATIVE
Influenza B by PCR: NEGATIVE
Resp Syncytial Virus by PCR: NEGATIVE
SARS Coronavirus 2 by RT PCR: NEGATIVE

## 2023-09-16 LAB — HEPATITIS B SURFACE ANTIGEN: Hepatitis B Surface Ag: NONREACTIVE

## 2023-09-16 LAB — FOLATE: Folate: 9.2 ng/mL (ref >5.9)

## 2023-09-16 LAB — PHOSPHORUS: Phosphorus: 4.4 mg/dL (ref 2.5–4.6)

## 2023-09-16 LAB — VITAMIN B12: Vitamin B-12: 1077 pg/mL — ABNORMAL HIGH (ref 180–914)

## 2023-09-16 LAB — MAGNESIUM: Magnesium: 1.8 mg/dL (ref 1.7–2.4)

## 2023-09-16 LAB — FERRITIN: Ferritin: 888 ng/mL — ABNORMAL HIGH (ref 24–336)

## 2023-09-16 LAB — GLUCOSE, CAPILLARY: Glucose-Capillary: 119 mg/dL — ABNORMAL HIGH (ref 70–99)

## 2023-09-16 LAB — CK: Total CK: 70 U/L (ref 49–397)

## 2023-09-16 MED ORDER — TRAZODONE HCL 50 MG PO TABS
50.0000 mg | ORAL_TABLET | Freq: Every evening | ORAL | Status: DC | PRN
  Administered 2023-09-19 – 2023-09-21 (×2): 50 mg via ORAL
  Filled 2023-09-16 (×2): qty 1

## 2023-09-16 MED ORDER — ACETAMINOPHEN 650 MG RE SUPP: 650.0000 mg | Freq: Four times a day (QID) | RECTAL | Status: DC | PRN

## 2023-09-16 MED ORDER — SODIUM CHLORIDE 0.9 % IV SOLN: INTRAVENOUS | Status: AC | PRN

## 2023-09-16 MED ORDER — PRAVASTATIN SODIUM 10 MG PO TABS: 10.0000 mg | ORAL_TABLET | Freq: Every day | ORAL | Status: DC

## 2023-09-16 MED ORDER — PANTOPRAZOLE SODIUM 40 MG PO TBEC: 40.0000 mg | DELAYED_RELEASE_TABLET | Freq: Every day | ORAL | Status: DC

## 2023-09-16 MED ORDER — INSULIN GLARGINE-YFGN 100 UNIT/ML ~~LOC~~ SOLN
6.0000 [IU] | Freq: Every day | SUBCUTANEOUS | Status: DC
  Filled 2023-09-16: qty 0.06

## 2023-09-16 MED ORDER — ONDANSETRON HCL 4 MG/2ML IJ SOLN: 4.0000 mg | Freq: Four times a day (QID) | INTRAMUSCULAR | Status: DC | PRN

## 2023-09-16 MED ORDER — HYDROCODONE-ACETAMINOPHEN 5-325 MG PO TABS
1.0000 | ORAL_TABLET | Freq: Two times a day (BID) | ORAL | Status: DC | PRN
  Administered 2023-09-19 – 2023-09-20 (×3): 1 via ORAL
  Filled 2023-09-16 (×3): qty 1

## 2023-09-16 MED ORDER — DARBEPOETIN ALFA 150 MCG/0.3ML IJ SOSY
150.0000 ug | PREFILLED_SYRINGE | INTRAMUSCULAR | Status: DC
  Administered 2023-09-17: 150 ug via SUBCUTANEOUS
  Filled 2023-09-16: qty 0.3

## 2023-09-16 MED ORDER — SODIUM CHLORIDE 0.9% FLUSH: 3.0000 mL | INTRAVENOUS | Status: DC | PRN

## 2023-09-16 MED ORDER — POLYETHYLENE GLYCOL 3350 17 G PO PACK
17.0000 g | PACK | Freq: Every day | ORAL | Status: DC | PRN
  Administered 2023-09-18: 17 g via ORAL
  Filled 2023-09-16: qty 1

## 2023-09-16 MED ORDER — ACETAMINOPHEN 325 MG PO TABS
650.0000 mg | ORAL_TABLET | Freq: Four times a day (QID) | ORAL | Status: DC | PRN
  Administered 2023-09-16 – 2023-09-19 (×2): 650 mg via ORAL
  Filled 2023-09-16 (×2): qty 2

## 2023-09-16 MED ORDER — BISACODYL 10 MG RE SUPP: 10.0000 mg | Freq: Every day | RECTAL | Status: DC | PRN

## 2023-09-16 MED ORDER — SERTRALINE HCL 50 MG PO TABS
50.0000 mg | ORAL_TABLET | Freq: Every day | ORAL | Status: DC
  Administered 2023-09-18 – 2023-09-21 (×4): 50 mg via ORAL
  Filled 2023-09-16 (×5): qty 1

## 2023-09-16 MED ORDER — SODIUM CHLORIDE 0.9% FLUSH
3.0000 mL | Freq: Two times a day (BID) | INTRAVENOUS | Status: DC
  Administered 2023-09-16 – 2023-09-22 (×9): 3 mL via INTRAVENOUS

## 2023-09-16 MED ORDER — DILTIAZEM HCL ER COATED BEADS 120 MG PO CP24
240.0000 mg | ORAL_CAPSULE | Freq: Every day | ORAL | Status: DC
  Administered 2023-09-18 – 2023-09-21 (×4): 240 mg via ORAL
  Filled 2023-09-16 (×5): qty 2

## 2023-09-16 MED ORDER — ARIPIPRAZOLE 2 MG PO TABS
2.0000 mg | ORAL_TABLET | Freq: Every day | ORAL | Status: DC
  Administered 2023-09-16 – 2023-09-21 (×6): 2 mg via ORAL
  Filled 2023-09-16 (×9): qty 1

## 2023-09-16 MED ORDER — SODIUM CHLORIDE 0.9% FLUSH
3.0000 mL | Freq: Two times a day (BID) | INTRAVENOUS | Status: DC
  Administered 2023-09-16 – 2023-09-22 (×11): 3 mL via INTRAVENOUS

## 2023-09-16 MED ORDER — INSULIN ASPART 100 UNIT/ML IJ SOLN
0.0000 [IU] | Freq: Every day | INTRAMUSCULAR | Status: DC
  Administered 2023-09-19: 2 [IU] via SUBCUTANEOUS

## 2023-09-16 MED ORDER — ONDANSETRON HCL 4 MG PO TABS: 4.0000 mg | ORAL_TABLET | Freq: Four times a day (QID) | ORAL | Status: DC | PRN

## 2023-09-16 MED ORDER — HEPARIN SODIUM (PORCINE) 5000 UNIT/ML IJ SOLN
5000.0000 [IU] | Freq: Three times a day (TID) | INTRAMUSCULAR | Status: DC
  Administered 2023-09-16 – 2023-09-22 (×16): 5000 [IU] via SUBCUTANEOUS
  Filled 2023-09-16 (×16): qty 1

## 2023-09-16 MED ORDER — DILTIAZEM HCL ER COATED BEADS 120 MG PO CP24: 240.0000 mg | ORAL_CAPSULE | Freq: Every day | ORAL | Status: DC

## 2023-09-16 MED ORDER — SEVELAMER CARBONATE 800 MG PO TABS: 2400.0000 mg | ORAL_TABLET | Freq: Three times a day (TID) | ORAL | Status: DC

## 2023-09-16 MED ORDER — CHLORHEXIDINE GLUCONATE CLOTH 2 % EX PADS
6.0000 | MEDICATED_PAD | Freq: Every day | CUTANEOUS | Status: DC
  Administered 2023-09-17 – 2023-09-22 (×5): 6 via TOPICAL

## 2023-09-16 MED ORDER — PANTOPRAZOLE SODIUM 40 MG IV SOLR
40.0000 mg | Freq: Two times a day (BID) | INTRAVENOUS | Status: DC
  Administered 2023-09-16 – 2023-09-21 (×12): 40 mg via INTRAVENOUS
  Filled 2023-09-16 (×14): qty 10

## 2023-09-16 MED ORDER — CALCITRIOL 0.25 MCG PO CAPS
1.0000 ug | ORAL_CAPSULE | ORAL | Status: DC
  Administered 2023-09-19: 1 ug via ORAL
  Filled 2023-09-16 (×2): qty 4

## 2023-09-16 MED ORDER — INSULIN ASPART 100 UNIT/ML IJ SOLN
0.0000 [IU] | Freq: Three times a day (TID) | INTRAMUSCULAR | Status: DC
Start: 2023-09-17 — End: 2023-09-20
  Administered 2023-09-18: 1 [IU] via SUBCUTANEOUS

## 2023-09-16 NOTE — Evaluation (Signed)
Physical Therapy Evaluation Patient Details Name: Mark TARAS Sr. MRN: [id number] DOB: Oct 09, 1940 Today's Date: 09/16/2023  History of Present Illness  83 y.o. male with a PMH of CAD, hyperlipidemia, hypertension, type 2 diabetes, SVT, and ESRD on HD (n/w/f).     Per wife, patient has been steadily declining over the past 6 months, but he has significantly worse in the last 2 weeks.  Pt unable to walk, stand up even. He has had few falls at home.  Pt also has been having difficulty with swallowing, he will choke and spit upon po intake. Pt has lost weight. Upon eating food patient will choke, spits it out. He has had problems with liquid intake as well.  Dr. Tanya Nones is the PCP.  He put patient on Protonix, with the thought that if he does not improve they can consult him to GI.  No previous history of dysphagia per wife.  Dr. Haynes Dage also suggest that the patient will likely need admission to the hospital for rehab admission.   Clinical Impression  Patient was agreeable to therapy. Min/mod for tasks assessed during session. Required cueing for use of RW and feet position for stance for sit to stand. Patient used RW for transfers and ambulation. Ambulation limited due to unsteadiness when standing. RW increased balance for transfers and ambulation. Was left in bed at conclusion of session. Patient will benefit from continued skilled physical therapy in hospital and recommended venue below to increase strength, balance, endurance for safe ADLs and gait.         If plan is discharge home, recommend the following: Help with stairs or ramp for entrance;Assistance with cooking/housework;A lot of help with walking and/or transfers;A lot of help with bathing/dressing/bathroom   Can travel by private vehicle        Equipment Recommendations None recommended by PT  Recommendations for Other Services       Functional Status Assessment Patient has had a recent decline in their functional status and  demonstrates the ability to make significant improvements in function in a reasonable and predictable amount of time.     Precautions / Restrictions Precautions Precautions: Fall Restrictions Weight Bearing Restrictions Per Provider Order: No      Mobility  Bed Mobility Overal bed mobility: Needs Assistance Bed Mobility: Supine to Sit, Sit to Supine     Supine to sit: Min assist, Mod assist, HOB elevated Sit to supine: Min assist, HOB elevated   General bed mobility comments: HOB slightly elevated; labored movement with difficulty pulling to sit with R UE while pushing with L UE to bed. More min A for sit to supine. Patient Response: Cooperative  Transfers Overall transfer level: Needs assistance Equipment used: Rolling walker (2 wheels) Transfers: Sit to/from Stand, Bed to chair/wheelchair/BSC Sit to Stand: Mod assist, Min assist   Step pivot transfers: Min assist, Mod assist       General transfer comment: Mod A to boost from [facility] per physical therapist. More min from bed. Slow labored movement with RW.    Ambulation/Gait Ambulation/Gait assistance: Min assist, Mod assist Gait Distance (Feet): 5 Feet (side steps) Assistive device: Rolling walker (2 wheels) Gait Pattern/deviations: Step-to pattern, Decreased step length - right, Decreased step length - left Gait velocity: slow     General Gait Details: slow, unsteady without RW, RW helped with amulation balance  Stairs            Wheelchair Mobility     Tilt Bed Tilt Bed Patient Response: Cooperative  Modified Rankin (Stroke Patients Only)       Balance Overall balance assessment: Needs assistance Sitting-balance support: No upper extremity supported, Feet supported Sitting balance-Leahy Scale: Fair Sitting balance - Comments: seated at EOB   Standing balance support: During functional activity, Bilateral upper extremity supported, Reliant on assistive device for balance Standing balance-Leahy  Scale: Poor Standing balance comment: using RW                             Pertinent Vitals/Pain Pain Assessment Pain Assessment: 0-10 Pain Score: 8  Pain Location: back Pain Descriptors / Indicators: Discomfort Pain Intervention(s): Monitored during session, Repositioned    Home Living Family/patient expects to be discharged to:: Private residence Living Arrangements: Spouse/significant other Available Help at Discharge: Family;Available 24 hours/day Type of Home: House Home Access: Ramped entrance       Home Layout: One level Home Equipment: Agricultural consultant (2 wheels);Cane - single point;Grab bars - tub/shower;Shower seat - built in;Lift chair Additional Comments: Family reports no change in living set up.    Prior Function Prior Level of Function : Needs assist       Physical Assist : ADLs (physical)   ADLs (physical): IADLs Mobility Comments: The past 2 weeks the pt has been less mobile. Pt used rollator for houshold ambulation prior to onset of weakness which resulted in more time in bed. ADLs Comments: Typically assisted for donning socks but reports independence otherwise. Over the past two weeks pt has been weaker and required more assist. Wife completes IADL's.     Extremity/Trunk Assessment   Upper Extremity Assessment Upper Extremity Assessment: Defer to OT evaluation    Lower Extremity Assessment Lower Extremity Assessment: Generalized weakness    Cervical / Trunk Assessment Cervical / Trunk Assessment: Kyphotic  Communication   Communication Communication: No apparent difficulties    Cognition Arousal: Alert Behavior During Therapy: WFL for tasks assessed/performed   PT - Cognitive impairments: No apparent impairments                         Following commands: Intact       Cueing Cueing Techniques: Verbal cues     General Comments      Exercises     Assessment/Plan    PT Assessment Patient needs continued PT  services  PT Problem List Decreased mobility;Decreased balance;Decreased activity tolerance;Decreased range of motion;Decreased strength       PT Treatment Interventions DME instruction;Gait training;Patient/family education;Stair training;Functional mobility training;Therapeutic activities;Therapeutic exercise;Balance training    PT Goals (Current goals can be found in the Care Plan section)  Acute Rehab PT Goals Patient Stated Goal: to return home after rehab PT Goal Formulation: With patient Time For Goal Achievement: 09/30/23 Potential to Achieve Goals: Good    Frequency Min 3X/week     Co-evaluation PT/OT/SLP Co-Evaluation/Treatment: Yes Reason for Co-Treatment: To address functional/ADL transfers PT goals addressed during session: Mobility/safety with mobility OT goals addressed during session: ADL's and self-care       AM-PAC PT "6 Clicks" Mobility  Outcome Measure Help needed turning from your back to your side while in a flat bed without using bedrails?: A Lot Help needed moving from lying on your back to sitting on the side of a flat bed without using bedrails?: A Lot Help needed moving to and from a bed to a chair (including a wheelchair)?: A Lot Help needed standing up from a chair using  your arms (e.g., wheelchair or bedside chair)?: A Lot Help needed to walk in hospital room?: A Lot Help needed climbing 3-5 steps with a railing? : A Lot 6 Click Score: 12    End of Session   Activity Tolerance: Patient tolerated treatment well Patient left: in bed;with call bell/phone within reach;with family/visitor present Nurse Communication: Mobility status PT Visit Diagnosis: Unsteadiness on feet (R26.81);Other abnormalities of gait and mobility (R26.89);Muscle weakness (generalized) (M62.81)    Time: 1610-9604 PT Time Calculation (min) (ACUTE ONLY): 17 min   Charges:   PT Evaluation $PT Eval Low Complexity: 1 Low PT Treatments $Therapeutic Activity: 8-22 mins PT  General Charges $$ ACUTE PT VISIT: 1 Visit         Jacob Cicero SPT

## 2023-09-16 NOTE — ED Notes (Signed)
Pt is a dialysis pt with MWF schedule, per Dolores Frame, MD, requested that the "HD team to do dialysis tomorrow in AM so it does not overlap with EGD after noon time"

## 2023-09-16 NOTE — ED Triage Notes (Signed)
Pt brought in by [facility] from home with c/o weakness and difficulty swallowing for the past couple of days. He has felt like he has been choking on his food and liquid. BP 125/33, HR 91, O2 sat 91% RA, CBG 127 for EMS. EMS also reports he has a pressure sore on his buttocks. Last dialysis session was yesterday. Wife reported to EMS that she has seen a gradual decline over the last 6 months, but even more over the last 2 weeks. He is normally able to drive himself to dialysis, but afterwards he can't even walk back inside.

## 2023-09-16 NOTE — ED Notes (Signed)
Pt is L arm restricted, attempted IV access in R arm, unable to find adequate venous access, will ask RN certified in IV ultrasound to attempt IV access

## 2023-09-16 NOTE — ED Notes (Signed)
Pt assisted pt to bedside commode in which pt had a large BM

## 2023-09-16 NOTE — ED Notes (Signed)
PT evaluating pt at bedside at this time

## 2023-09-16 NOTE — H&P (Signed)
History and Physical   Patient: [name] JXB:[id number] DOB: May 28, 1941 DOA: 09/16/2023 DOS: the patient was seen and examined on 09/16/2023 PCP: Donita Brooks, MD  Patient coming from: Home  Chief Complaint:  Chief Complaint  Patient presents with   Weakness   HPI: Mark WADHWA Sr. is a 83 y.o. male with medical history significant for  ESRD on HD MWF, HTN, HLD, DM II, nephrolithiasis , hearing loss, history of palpitations/SVT presents to ED on 09/16/23--by [facility] from home with c/o weakness and difficulty swallowing for the past couple of days. He has felt like he has been choking on his food and liquid.   Last dialysis session was yesterday. Wife reported to EMS that she has seen a gradual decline over the last 6 months, but even more over the last 2 weeks. He is normally able to drive himself to dialysis, but afterwards he can't even walk back inside.   Additional history obtained from wife who is concerns about dysphagia, especially over last 2 weeks with post prandial emesis even with oatmeal and liquids -Emesis is without bile or blood--just ingested gastric contents  No diarrhea No fever  Or chills  No CP,  C/o weakness and fall risk   In ED  CT Head w/o acute findings  CXR w/o acute findings Covid, flu and RSV negative Na-132,  alk phos 323, otherwise LFT are not elevated WBC-10.6, Hgb 7.8 , platelets 106 Serum iron, ferritin, B12 and Folate are Not low  Review of Systems: As mentioned in the history of present illness. All other systems reviewed and are negative.   Past Medical History:  Diagnosis Date   Arthritis    lower back   Chronic back pain    Disc disease   Chronic kidney disease    Coronary atherosclerosis of native coronary artery    Coronary calcifications by chest CT, Myoview demonstrating inferolateral scar   Deafness in right ear    Diabetes mellitus    Diabetic retinopathy (HCC)    moderate nonproliferative with macular edema in  right eye   Diastolic dysfunction 12/2011   Grade 1.   Essential hypertension, benign    Heart murmur    in past   HOH (hard of hearing)    Hyperkalemia    Kidney stones    Mixed hyperlipidemia    NAFLD (nonalcoholic fatty liver disease)    Pancreatic insufficiency    Diagnosed at [facility]   Prostate cancer [facility]) 2011   Shortness of breath dyspnea    occasional - liver pressing on right lung, decreased capacity   Subdural hematoma (HCC)    Type 2 diabetes mellitus (HCC)    Wears hearing aid    left   Past Surgical History:  Procedure Laterality Date   A/V FISTULAGRAM Left 07/02/2023   Procedure: A/V Fistulagram;  Surgeon: Daria Pastures, MD;  Location: [facility] INVASIVE CV LAB;  Service: Vascular;  Laterality: Left;   APPENDECTOMY     AV FISTULA PLACEMENT Left 05/15/2021   Procedure: INSERTION OF LEFT ARM ARTERIOVENOUS (AV) GORE-TEX GRAFT;  Surgeon: Larina Earthly, MD;  Location: [facility];  Service: Vascular;  Laterality: Left;   Bilateral hip replacement     CATARACT EXTRACTION W/PHACO Left 05/15/2015   Procedure: CATARACT EXTRACTION PHACO AND INTRAOCULAR LENS PLACEMENT (IOC);  Surgeon: Sherald Hess, MD;  Location: [facility] SURGERY CNTR;  Service: Ophthalmology;  Laterality: Left;  DIABETIC - insulin pump and oral meds   CATARACT EXTRACTION W/PHACO  Right 08/28/2015   Procedure: CATARACT EXTRACTION PHACO AND INTRAOCULAR LENS PLACEMENT (IOC);  Surgeon: Sherald Hess, MD;  Location: [facility] SURGERY CNTR;  Service: Ophthalmology;  Laterality: Right;  DIABETIC PER PT AND CINDY PLEASE KEEP ARRIVAL TIME AFTER 8AM    CHOLECYSTECTOMY     CYSTOSCOPY W/ URETERAL STENT PLACEMENT Right 05/29/2023   Procedure: CYSTOSCOPY WITH RETROGRADE PYELOGRAM/URETERAL STENT PLACEMENT;  Surgeon: Malen Gauze, MD;  Location: [facility];  Service: Urology;  Laterality: Right;   CYSTOSCOPY WITH RETROGRADE PYELOGRAM, URETEROSCOPY AND STENT PLACEMENT Right 07/24/2023   Procedure: CYSTOSCOPY WITH  RETROGRADE PYELOGRAM, URETEROSCOPY AND STENT PLACEMENT;  Surgeon: Malen Gauze, MD;  Location: [facility];  Service: Urology;  Laterality: Right;  dialysis pt   HERNIA REPAIR     INSERTION OF DIALYSIS CATHETER Right 01/22/2021   Procedure: INSERTION OF DIALYSIS CATHETER;  Surgeon: Lucretia Roers, MD;  Location: [facility];  Service: General;  Laterality: Right;   INSERTION OF DIALYSIS CATHETER Right 04/16/2021   Procedure: INSERTION OF DIALYSIS CATHETER;  Surgeon: Lucretia Roers, MD;  Location: [facility];  Service: General;  Laterality: Right;   PERIPHERAL VASCULAR BALLOON ANGIOPLASTY  07/02/2023   Procedure: PERIPHERAL VASCULAR BALLOON ANGIOPLASTY;  Surgeon: Daria Pastures, MD;  Location: [facility] INVASIVE CV LAB;  Service: Vascular;;  lt arm fistula   PERIPHERAL VASCULAR BALLOON ANGIOPLASTY  09/01/2023   Procedure: PERIPHERAL VASCULAR BALLOON ANGIOPLASTY;  Surgeon: Ethelene Hal, MD;  Location: [facility] INVASIVE CV LAB;  Service: Cardiovascular;;  50% Venous Anastomosis; 70% Intragraft   PERIPHERAL VASCULAR THROMBECTOMY N/A 09/01/2023   Procedure: PERIPHERAL VASCULAR THROMBECTOMY;  Surgeon: Ethelene Hal, MD;  Location: [facility] INVASIVE CV LAB;  Service: Cardiovascular;  Laterality: N/A;   PROSTATECTOMY  2011   STONE EXTRACTION WITH BASKET Right 07/24/2023   Procedure: STONE EXTRACTION WITH BASKET;  Surgeon: Malen Gauze, MD;  Location: [facility];  Service: Urology;  Laterality: Right;   Social History:  reports that he has never smoked. He has never used smokeless tobacco. He reports that he does not drink alcohol and does not use drugs.  Allergies  Allergen Reactions   Enalapril Maleate Cough    Family History  Problem Relation Age of Onset   Diabetes type II Mother    Heart attack Father    Colon cancer Neg Hx    Gastric cancer Neg Hx    Esophageal cancer Neg Hx     Prior to Admission medications   Medication Sig Start Date End Date Taking? Authorizing Provider  acetaminophen (TYLENOL) 500  MG tablet Take 500-1,000 mg by mouth every 6 (six) hours as needed (PAIN.).   Yes [provider]  ARIPiprazole (ABILIFY) 2 MG tablet Take 1 tablet (2 mg total) by mouth daily. Patient taking differently: Take 2 mg by mouth at bedtime. 08/21/23  Yes Donita Brooks, MD  diltiazem (CARDIZEM CD) 240 MG 24 hr capsule TAKE 1 CAPSULE(240 MG) BY MOUTH DAILY 09/30/22  Yes Donita Brooks, MD  insulin aspart (NOVOLOG) 100 UNIT/ML injection Inject 8 Units into the skin 3 (three) times daily with meals. Patient taking differently: Inject 2 Units into the skin every Monday, Wednesday, and Friday. 02/06/21  Yes Shah, Pratik D, DO  insulin glargine (LANTUS) 100 UNIT/ML injection Inject 0.12 mLs (12 Units total) into the skin at bedtime. Patient taking differently: Inject 6 Units into the skin every Tuesday, Thursday, Saturday, and Sunday at 6 PM. 03/20/21  Yes Pickard, Priscille Heidelberg, MD  lidocaine-prilocaine (EMLA) cream  APPLY SMALL AMOUNT TO ACCESS SITE (AVF) 30 MINUTES BEFORE DIALYSIS. COVER WITH OCCLUSIVE DRESSING (SARAN WRAP) 08/28/23  Yes Donita Brooks, MD  pantoprazole (PROTONIX) 40 MG tablet Take 1 tablet (40 mg total) by mouth daily. 08/28/23  Yes Donita Brooks, MD  pravastatin (PRAVACHOL) 10 MG tablet TAKE 1 TABLET(10 MG) BY MOUTH DAILY WITH BREAKFAST 03/14/23  Yes Donita Brooks, MD  sertraline (ZOLOFT) 50 MG tablet Take 1 tablet (50 mg total) by mouth daily. 09/04/23  Yes Donita Brooks, MD  sevelamer carbonate (RENVELA) 800 MG tablet Take 2,400 mg by mouth 3 (three) times daily with meals. 05/09/23  Yes [provider]  Continuous Glucose Sensor (FREESTYLE LIBRE 3 PLUS SENSOR) MISC Change sensor every 15 days. 06/02/23   Donita Brooks, MD  diazepam (VALIUM) 2 MG tablet Take 1 tablet (2 mg total) by mouth at bedtime. Patient not taking: Reported on 08/21/2023 07/18/23   Donita Brooks, MD  HYDROcodone-acetaminophen (NORCO/VICODIN) 5-325 MG tablet Take 1 tablet by mouth every  12 (twelve) hours as needed for severe pain (pain score 7-10). Must last 30 days. 09/10/23 10/10/23  Edward Jolly, MD    Physical Exam: Vitals:   09/16/23 1358 09/16/23 1515 09/16/23 1543 09/16/23 1834  BP:  90/73 106/63 (!) 90/32  Pulse:  84 80 77  Resp:  15 17 18   Temp: 98 F (36.7 C) 97.8 F (36.6 C) 98.3 F (36.8 C) 98.5 F (36.9 C)  TempSrc: Oral Oral Oral Oral  SpO2:  100% 98% 94%  Weight:   66.3 kg   Height:   5\' 8"  (1.727 m)     Physical Exam Gen:- Awake Alert, in no acute distress  HEENT:- [place].AT, No sclera icterus Neck-Supple Neck,No JVD,.  Lungs-  CTAB , fair air movement bilaterally  CV- S1, S2 normal, RRR Abd-  +ve B.Sounds, Abd Soft, No tenderness,    Extremity/Skin:- No  edema,   good pedal pulses  Psych-affect is appropriate, oriented x3 Neuro-Generalized weakness, No new focal deficits, no tremors MSK- Left upper extremity AV graft has good thrill and bruit.   Data Reviewed: CT Head w/o acute findings  CXR w/o acute findings Covid, flu and RSV negative Na-132,  alk phos 323, otherwise LFT are not elevated WBC-10.6, Hgb 7.8 , platelets 106 Serum iron, ferritin, B12 and Folate are Not low  Assessment and Plan:  1)Dysphagia---  especially over last 2 weeks with post prandial emesis even with oatmeal and liquids -Emesis is without bile or blood--just ingested gastric contents  -Gi consult appreciated, plan is for EGD on 09/17/23 Protonix as ordered  2)End stage renal disease =--MWF  Has Not missed any HD sessions  Nephrology consult for HD   3)Chronic Anemia of ESRD--- Hgb 7.8 , platelets 106 No Bleeding concerns  Serum iron, ferritin, B12 and Folate are Not low - Epogen/ESA therapy as per nephrology service discretion. Marland Kitchen  4)Generalized Weakness/Fall Risk-- Phy therapy and OT eval appreciated--recommends SNF  5)Essential hypertension, benign -Hold Cardizem due to soft BP   6)H/o SVT (supraventricular tachycardia)  -Hold Cardizem due to soft  BP -Heart rate stable  7)DM2- A1C- 6.5 which reflects excellent diabetic control PTA  Hold Semgle as pt is NPO for EGD in am  Use Novolog/Humalog Sliding scale insulin with Accu-Cheks/Fingersticks as ordered   8)Social/Ethics--palliative care consult appreciated -Remains a full code  9)Depression/Anxiety--stable, continue scheduled Abilify and Zoloft -Trazodone nightly as needed    Advance Care Planning:   Code Status: Prior  Consults: Nephrology, palliative  Family Communication: wife  Severity of Illness: The appropriate patient status for this patient is OBSERVATION. Observation status is judged to be reasonable and necessary in order to provide the required intensity of service to ensure the patient's safety. The patient's presenting symptoms, physical exam findings, and initial radiographic and laboratory data in the context of their medical condition is felt to place them at decreased risk for further clinical deterioration. Furthermore, it is anticipated that the patient will be medically stable for discharge from the hospital within 2 midnights of admission.   Author: Shon Hale, MD 09/16/2023 7:35 PM  For on call review [email or web address].

## 2023-09-16 NOTE — Plan of Care (Signed)
  Problem: Acute Rehab OT Goals (only OT should resolve) Goal: Pt. Will Perform Grooming Flowsheets (Taken 09/16/2023 1543) Pt Will Perform Grooming:  with modified independence  standing Goal: Pt. Will Perform Upper Body Dressing Flowsheets (Taken 09/16/2023 1543) Pt Will Perform Upper Body Dressing:  with modified independence  sitting Goal: Pt. Will Perform Lower Body Dressing Flowsheets (Taken 09/16/2023 1543) Pt Will Perform Lower Body Dressing:  with modified independence  sitting/lateral leans Goal: Pt. Will Transfer To Toilet Flowsheets (Taken 09/16/2023 1543) Pt Will Transfer to Toilet:  with modified independence  ambulating Goal: Pt. Will Perform Toileting-Clothing Manipulation Flowsheets (Taken 09/16/2023 1543) Pt Will Perform Toileting - Clothing Manipulation and hygiene:  with modified independence  sitting/lateral leans  sit to/from stand Goal: Pt/Caregiver Will Perform Home Exercise Program Flowsheets (Taken 09/16/2023 1543) Pt/caregiver will Perform Home Exercise Program:  Increased strength  Both right and left upper extremity  Independently  Jahzier Villalon OT, MOT

## 2023-09-16 NOTE — Progress Notes (Signed)
SLP Cancellation Note  Patient Details Name: Mark COULTHARD Sr. MRN: [id number] DOB: 10/02/1940   Cancelled treatment:       Reason Eval/Treat Not Completed: Other (comment); Acknowledge order for SLP to evaluate oropharyngeal swallow. Chart reviewed. Pt is scheduled for EGD tomorrow and SLP will plan to see Pt following that and can consider MBSS if indicated.   Thank you,  Havery Moros, CCC-SLP [phone]    [name] 09/16/2023, 6:08 PM

## 2023-09-16 NOTE — ED Notes (Signed)
RN certified in [place] IV is at bedside at this time attempted to obtain IV access

## 2023-09-16 NOTE — Consult Note (Signed)
Consultation Note Date: 09/16/2023   Patient Name: [name]  DOB: Dec 03, 1940  MRN: [id number]  Age / Sex: 83 y.o., male  PCP: Donita Brooks, MD Referring Physician: Shon Hale, MD  Reason for Consultation: Establishing goals of care  HPI/Patient Profile: 83 y.o. male  with past medical history of CAD, hyperlipidemia, HTN, DM, SVT, ESRD on dialysis, heart murmur, hard of hearing, admitted on 09/16/2023 with failure to thrive, regurgitation, dysphagia..   Clinical Assessment and Goals of Care: I have reviewed medical records including EPIC notes, labs and imaging, received report from RN, assessed the patient.  [name] is lying quietly on the stretcher in the ED.  He appears acutely/chronically ill and quite frail.  He greets me, making and mostly keeping eye contact.  He is alert and oriented x 3, able to make his needs known.  His wife of 13 years, [name], is present at bedside.  Specialty RN is present at bedside placing ultrasound guided IV.  We meet at the bedside to discuss diagnosis prognosis, GOC, EOL wishes, disposition and options.  I introduced Palliative Medicine as specialized medical care for people living with serious illness. It focuses on providing relief from the symptoms and stress of a serious illness. The goal is to improve quality of life for both the patient and the family.  We discussed a brief life review of the patient.  Mr. and Mrs. [name] have been married 13 years, [name] shares that they met online.  [name] tells me that he has been taking hemodialysis for approximately 3 years.  He shares a story of decline over the last few years, last few months in particular especially with his by mouth intake.  He states that his appetite has decreased since he had COVID.  He talks about frequent regurgitation.  We then focused on their current illness.  We talk about  his poor by mouth intake and dysphagia.  We talk about GI consult and the treatment plan, time for testing and outcomes.  [name] frequently's mentions his frailty and inability to care for himself.  We talk about the plan for hospital admit.  The natural disease trajectory and expectations at EOL were discussed.  Advanced directives, concepts specific to code status, artifical feeding and hydration, and rehospitalization were considered and discussed.  We talk about the concept of "treat the treatable, but allow a natural passing".  At this point, [name] states that he would want attempted resuscitation, his wife shares that if he could have meaningful life once resuscitated he would want this.  We talk about some statistics and realities related to CPR and intubation.  I encouraged them to consider what this time looks like and feels like, what they do and do not want.  Discussed the importance of continued conversation with family and the medical providers regarding overall plan of care and treatment options, ensuring decisions are within the context of the patient's values and GOCs. Questions and concerns were addressed.  Hard Choices booklet left  for review. The family was encouraged to call with questions or concerns.  PMT will continue to support holistically.  Conference with attending, GI NP, bedside nursing staff, transition of care team related to patient condition, needs, goals of care, disposition.   HCPOA HCPOA -daughter, [name].  HCPOA power of attorney paperwork noted in [facility] tab of epic chart, completed January 2023.    SUMMARY OF RECOMMENDATIONS   Continue to treat the treatable Full scope/full code Time for outcomes PMT to follow   Code Status/Advance Care Planning: Full code - We talk about the concept of "treat the treatable, but allow a natural passing".  At this point, [name] states that he would want attempted resuscitation, his wife shares that  if he could have meaningful life once resuscitated he would want this.  We talk about some statistics and realities related to CPR and intubation.  I encouraged them to consider what this time looks like and feels like, what they do and do not want.  Symptom Management:  Per hospitalist/nephrologist, no additional needs at this time. No morphine for end-stage renal disease.  Palliative Prophylaxis:  Frequent Pain Assessment and Oral Care  Additional Recommendations (Limitations, Scope, Preferences): Full Scope Treatment  Psycho-social/Spiritual:  Desire for further Chaplaincy support:no Additional Recommendations: Caregiving  Support/Resources and Education on Hospice  Prognosis:  Unable to determine, based on outcomes and patient/family choice.  Discharge Planning: To Be Determined      Primary Diagnoses: Present on Admission: **None**   I have reviewed the medical record, interviewed the patient and family, and examined the patient. The following aspects are pertinent.  Past Medical History:  Diagnosis Date   Arthritis    lower back   Chronic back pain    Disc disease   Chronic kidney disease    Coronary atherosclerosis of native coronary artery    Coronary calcifications by chest CT, Myoview demonstrating inferolateral scar   Deafness in right ear    Diabetes mellitus    Diabetic retinopathy (HCC)    moderate nonproliferative with macular edema in right eye   Diastolic dysfunction 12/2011   Grade 1.   Essential hypertension, benign    Heart murmur    in past   HOH (hard of hearing)    Hyperkalemia    Kidney stones    Mixed hyperlipidemia    NAFLD (nonalcoholic fatty liver disease)    Pancreatic insufficiency    Diagnosed at [facility]   Prostate cancer [facility]) 2011   Shortness of breath dyspnea    occasional - liver pressing on right lung, decreased capacity   Subdural hematoma (HCC)    Type 2 diabetes mellitus (HCC)    Wears hearing aid    left   Social  History   Socioeconomic History   Marital status: Married    Spouse name: Darlene   Number of children: 3   Years of education: Not on file   Highest education level: Not on file  Occupational History   Not on file  Tobacco Use   Smoking status: Never   Smokeless tobacco: Never  Vaping Use   Vaping status: Never Used  Substance and Sexual Activity   Alcohol use: No   Drug use: No   Sexual activity: Yes  Other Topics Concern   Not on file  Social History Narrative   3 children, 1 daughter and 1 son sorry deceased. 1 daughter living in [place] and 1 step child   Married x 13 years 08/25/21.  7 grandchildren   1 great grandchild   Social Drivers of Corporate investment banker Strain: Low Risk  (08/22/2022)   Overall Financial Resource Strain (CARDIA)    Difficulty of Paying Living Expenses: Not hard at all  Food Insecurity: No Food Insecurity (06/05/2023)   Hunger Vital Sign    Worried About Running Out of Food in the Last Year: Never true    Ran Out of Food in the Last Year: Never true  Transportation Needs: No Transportation Needs (06/05/2023)   PRAPARE - Administrator, Civil Service (Medical): No    Lack of Transportation (Non-Medical): No  Physical Activity: Inactive (08/22/2022)   Exercise Vital Sign    Days of Exercise per Week: 0 days    Minutes of Exercise per Session: 0 min  Stress: No Stress Concern Present (08/22/2022)   [employer] of Occupational Health - Occupational Stress Questionnaire    Feeling of Stress : Not at all  Social Connections: Moderately Isolated (08/22/2022)   Social Connection and Isolation Panel [NHANES]    Frequency of Communication with Friends and Family: More than three times a week    Frequency of Social Gatherings with Friends and Family: Three times a week    Attends Religious Services: Never    Active Member of Clubs or Organizations: No    Attends Banker Meetings: Never    Marital Status: Married    Family History  Problem Relation Age of Onset   Diabetes type II Mother    Heart attack Father    Colon cancer Neg Hx    Gastric cancer Neg Hx    Esophageal cancer Neg Hx    Scheduled Meds:  [START ON 09/17/2023] calcitRIOL  1 mcg Oral Q M,W,F   [START ON 09/17/2023] Chlorhexidine Gluconate Cloth  6 each Topical Q0600   [START ON 09/17/2023] darbepoetin (ARANESP) injection - DIALYSIS  150 mcg Subcutaneous Q Wed-1800   pantoprazole (PROTONIX) IV  40 mg Intravenous Q12H   Continuous Infusions: PRN Meds:. Medications Prior to Admission:  Prior to Admission medications   Medication Sig Start Date End Date Taking? Authorizing Provider  acetaminophen (TYLENOL) 500 MG tablet Take 500-1,000 mg by mouth every 6 (six) hours as needed (PAIN.).   Yes [provider]  ARIPiprazole (ABILIFY) 2 MG tablet Take 1 tablet (2 mg total) by mouth daily. Patient taking differently: Take 2 mg by mouth at bedtime. 08/21/23  Yes Donita Brooks, MD  diltiazem (CARDIZEM CD) 240 MG 24 hr capsule TAKE 1 CAPSULE(240 MG) BY MOUTH DAILY 09/30/22  Yes Donita Brooks, MD  insulin aspart (NOVOLOG) 100 UNIT/ML injection Inject 8 Units into the skin 3 (three) times daily with meals. Patient taking differently: Inject 2 Units into the skin every Monday, Wednesday, and Friday. 02/06/21  Yes Shah, Pratik D, DO  insulin glargine (LANTUS) 100 UNIT/ML injection Inject 0.12 mLs (12 Units total) into the skin at bedtime. Patient taking differently: Inject 6 Units into the skin every Tuesday, Thursday, Saturday, and Sunday at 6 PM. 03/20/21  Yes Pickard, Priscille Heidelberg, MD  lidocaine-prilocaine (EMLA) cream APPLY SMALL AMOUNT TO ACCESS SITE (AVF) 30 MINUTES BEFORE DIALYSIS. COVER WITH OCCLUSIVE DRESSING (SARAN WRAP) 08/28/23  Yes Donita Brooks, MD  pantoprazole (PROTONIX) 40 MG tablet Take 1 tablet (40 mg total) by mouth daily. 08/28/23  Yes Donita Brooks, MD  pravastatin (PRAVACHOL) 10 MG tablet TAKE 1 TABLET(10 MG) BY  MOUTH DAILY WITH BREAKFAST 03/14/23  Yes  Donita Brooks, MD  sertraline (ZOLOFT) 50 MG tablet Take 1 tablet (50 mg total) by mouth daily. 09/04/23  Yes Donita Brooks, MD  sevelamer carbonate (RENVELA) 800 MG tablet Take 2,400 mg by mouth 3 (three) times daily with meals. 05/09/23  Yes [provider]  Continuous Glucose Sensor (FREESTYLE LIBRE 3 PLUS SENSOR) MISC Change sensor every 15 days. 06/02/23   Donita Brooks, MD  diazepam (VALIUM) 2 MG tablet Take 1 tablet (2 mg total) by mouth at bedtime. Patient not taking: Reported on 08/21/2023 07/18/23   Donita Brooks, MD  HYDROcodone-acetaminophen (NORCO/VICODIN) 5-325 MG tablet Take 1 tablet by mouth every 12 (twelve) hours as needed for severe pain (pain score 7-10). Must last 30 days. 09/10/23 10/10/23  Edward Jolly, MD   Allergies  Allergen Reactions   Enalapril Maleate Cough   Review of Systems  Unable to perform ROS: Acuity of condition    Physical Exam Vitals and nursing note reviewed.     Vital Signs: BP (!) 109/51 (BP Location: Right Arm)   Pulse 80   Temp 98 F (36.7 C) (Oral)   Resp 11   Ht 5\' 8"  (1.727 m)   Wt 64.9 kg   SpO2 90%   BMI 21.74 kg/m  Pain Scale: 0-10   Pain Score: 0-No pain   SpO2: SpO2: 90 % O2 Device:SpO2: 90 % O2 Flow Rate: .   IO: Intake/output summary: No intake or output data in the 24 hours ending 09/16/23 1403  LBM:   Baseline Weight: Weight: 64.9 kg Most recent weight: Weight: 64.9 kg     Palliative Assessment/Data:     Time In: 1230 Time Out: 1345 Time Total: 75 minutes  Greater than 50%  of this time was spent counseling and coordinating care related to the above assessment and plan.  Signed by: Katheran Awe, NP   Please contact Palliative Medicine Team phone at [phone] for questions and concerns.  For individual provider: See Loretha Stapler

## 2023-09-16 NOTE — ED Notes (Signed)
The ED does not have any restricted extremity armbands to go on L wirst at this time

## 2023-09-16 NOTE — [place] FL2 (Signed)
[place] MEDICAID FL2 LEVEL OF CARE FORM     IDENTIFICATION  Patient Name: Mark BUSIC Sr. Birthdate: Aug 22, 1940 Sex: male Admission Date (Current Location): 09/16/2023  [facility] and [place] Number:  [employer] and Address:  [facility],  [address], [place] [address]      Provider Number: [id number]  Attending Physician Name and Address:  Shon Hale, MD  Relative Name and Phone Number:       Current Level of Care: Hospital Recommended Level of Care: Skilled Nursing Facility Prior Approval Number:    Date Approved/Denied:   PASRR Number:    Discharge Plan: SNF    Current Diagnoses: Patient Active Problem List   Diagnosis Date Noted   Dysphagia 09/16/2023   Hemodialysis patient (HCC) 08/07/2023   Oliguria 08/07/2023   Kidney stones 08/07/2023   Ureteral calculi 05/29/2023   Sepsis due to Escherichia coli (e. coli) (HCC) 05/28/2023   Urinary tract infection, site not specified 05/28/2023   Dysuria 04/17/2023   Anemia 03/12/2023   Pain due to onychomycosis of toenails of both feet 01/07/2023   Encounter for long-term opiate analgesic use 11/15/2021   Pain management contract signed 09/18/2021   Calciphylaxis of left lower extremity with nonhealing ulcer with necrosis of muscle (HCC) 09/18/2021   Chronic pain syndrome 09/18/2021   Hypocalcemia 06/19/2021   Nausea 05/19/2021   Metabolic encephalopathy 02/02/2021   End stage renal disease (HCC) 02/02/2021   Hypoglycemia 02/02/2021   E. Faecalis and Staph Aureus UTI 02/02/2021   Vascular dialysis catheter in place [facility])    Shingles 01/14/2021   Pressure injury of skin 01/09/2021   SVT (supraventricular tachycardia) (HCC) 01/07/2021   Acute renal failure superimposed on stage 3b chronic kidney disease (HCC) 01/03/2021   Acute metabolic encephalopathy 01/03/2021   Hyperkalemia 12/02/2018   Altered mental state 12/02/2018   Pancreatic insufficiency 12/02/2018   Wears hearing  aid 12/02/2018   HOH (hard of hearing) 12/02/2018   DM type 2 with diabetic peripheral neuropathy (HCC) 09/15/2018   Uncontrolled type 2 diabetes mellitus with hyperglycemia (HCC) 09/15/2018   Peripheral edema 06/25/2018   NAFLD (nonalcoholic fatty liver disease)    Diabetic retinopathy (HCC)    ED (erectile dysfunction) 01/05/2014   Shortness of breath 12/05/2011   Coronary atherosclerosis of native coronary artery 12/05/2011   Type 2 diabetes mellitus with hyperlipidemia (HCC) 12/05/2011   Essential hypertension, benign 12/05/2011   Mixed hyperlipidemia 12/05/2011   Malignant neoplasm of prostate (HCC) 06/26/2011    Orientation RESPIRATION BLADDER Height & Weight     Self, Time, Situation, Place  Normal Continent Weight: 143 lb (64.9 kg) Height:  5\' 8"  (172.7 cm)  BEHAVIORAL SYMPTOMS/MOOD NEUROLOGICAL BOWEL NUTRITION STATUS      Continent Diet (See D/C summary)  AMBULATORY STATUS COMMUNICATION OF NEEDS Skin   Extensive Assist Verbally Normal                       Personal Care Assistance Level of Assistance  Bathing, Feeding, Dressing Bathing Assistance: Limited assistance Feeding assistance: Independent Dressing Assistance: Limited assistance     Functional Limitations Info  Sight, Hearing, Speech Sight Info: Adequate Hearing Info: Adequate Speech Info: Adequate    SPECIAL CARE FACTORS FREQUENCY  PT (By licensed PT), OT (By licensed OT)     PT Frequency: 5 times weekly OT Frequency: 5 times weekly            Contractures Contractures Info: Not present  Additional Factors Info  Code Status, Allergies Code Status Info: FULL Allergies Info: Enalapril Maleate           Current Medications (09/16/2023):  This is the current hospital active medication list Current Facility-Administered Medications  Medication Dose Route Frequency Provider Last Rate Last Admin   [START ON 09/17/2023] calcitRIOL (ROCALTROL) capsule 1 mcg  1 mcg Oral Q M,W,F Darnell Level, MD       [START ON 09/17/2023] Chlorhexidine Gluconate Cloth 2 % PADS 6 each  6 each Topical Q0600 Darnell Level, MD       [START ON 09/17/2023] Darbepoetin Alfa (ARANESP) injection 150 mcg  150 mcg Subcutaneous Q Wed-1800 Darnell Level, MD       pantoprazole (PROTONIX) injection 40 mg  40 mg Intravenous Q12H Gelene Mink, NP   40 mg at 09/16/23 1439   Current Outpatient Medications  Medication Sig Dispense Refill   acetaminophen (TYLENOL) 500 MG tablet Take 500-1,000 mg by mouth every 6 (six) hours as needed (PAIN.).     ARIPiprazole (ABILIFY) 2 MG tablet Take 1 tablet (2 mg total) by mouth daily. (Patient taking differently: Take 2 mg by mouth at bedtime.) 30 tablet 3   diltiazem (CARDIZEM CD) 240 MG 24 hr capsule TAKE 1 CAPSULE(240 MG) BY MOUTH DAILY 90 capsule 3   insulin aspart (NOVOLOG) 100 UNIT/ML injection Inject 8 Units into the skin 3 (three) times daily with meals. (Patient taking differently: Inject 2 Units into the skin every Monday, Wednesday, and Friday.) 10 mL 11   insulin glargine (LANTUS) 100 UNIT/ML injection Inject 0.12 mLs (12 Units total) into the skin at bedtime. (Patient taking differently: Inject 6 Units into the skin every Tuesday, Thursday, Saturday, and Sunday at 6 PM.) 10 mL 11   lidocaine-prilocaine (EMLA) cream APPLY SMALL AMOUNT TO ACCESS SITE (AVF) 30 MINUTES BEFORE DIALYSIS. COVER WITH OCCLUSIVE DRESSING (SARAN WRAP) 60 g 11   pantoprazole (PROTONIX) 40 MG tablet Take 1 tablet (40 mg total) by mouth daily. 30 tablet 3   pravastatin (PRAVACHOL) 10 MG tablet TAKE 1 TABLET(10 MG) BY MOUTH DAILY WITH BREAKFAST 90 tablet 1   sertraline (ZOLOFT) 50 MG tablet Take 1 tablet (50 mg total) by mouth daily. 90 tablet 3   sevelamer carbonate (RENVELA) 800 MG tablet Take 2,400 mg by mouth 3 (three) times daily with meals.     Continuous Glucose Sensor (FREESTYLE LIBRE 3 PLUS SENSOR) MISC Change sensor every 15 days. 2 each 6   diazepam (VALIUM) 2 MG tablet Take  1 tablet (2 mg total) by mouth at bedtime. (Patient not taking: Reported on 08/21/2023) 30 tablet 0   HYDROcodone-acetaminophen (NORCO/VICODIN) 5-325 MG tablet Take 1 tablet by mouth every 12 (twelve) hours as needed for severe pain (pain score 7-10). Must last 30 days. 60 tablet 0     Discharge Medications: Please see discharge summary for a list of discharge medications.  Relevant Imaging Results:  Relevant Lab Results:   Additional Information SSN: [id number].  Villa Herb, LCSWA

## 2023-09-16 NOTE — Consult Note (Signed)
Gastroenterology Consult   Referring Provider: Dr. Derwood Kaplan, [facility] phyisican Primary Care Physician:  Donita Brooks, MD Primary Gastroenterologist:  Dr. Levon Hedger, previously unassigned  Patient ID: [name]; [id number]; [birth year]   Admit date: 09/16/2023  LOS: 0 days   Date of Consultation: 09/16/2023  Reason for Consultation:  failure to thrive, regurgitation, dysphagia  History of Present Illness   Mark L Mceachron Sr. is an 83 y.o. year old male with multi-morbidities including CAD, hyperlipidemia, HTN, DM, SVT, ESRD on dialysis, heart murmur, hard of hearing, presenting to the ED with progressive functional decline, failure to thrive, noted over past 6 months but now escalating specifically over past few weeks. Due to reports of dysphagia and regurgitation, GI has been consulted.    In the ED: Hgb 7.8, with baseline around 8/9 range. WBC count 10.6, MCV 109, platelets 106, sodium 133, creatinine 2.21, Albumin 1.6, Alk phos 323. CXR no acute findings. CT head without contrast with parenchymal atrophy and chronic small vessel ischemic disease, paranasal sinus disease with 15 mm polypoid soft tissue focus within right maxillary sinus, small right mastoid effusion.   At bedside: Wife with patient and provides majority of history due to his difficulty hearing. Dialysis M, W, F. Tries to eat and gags then spits up. As soon as goes down, comes back up. PCP placed on pantoprazole a week or so ago. Gagging. Oatmeal can slide down ok but at times difficult. Liquid comes out of nose. Symptoms present for at least 2 weeks but family member feels longer. No abdominal pain. Has had more heartburn in last week. New onset. No chronic hx of GERD or use of PPI.   218 lbs to 142 within the past few years unintentionally. Decreased oral intake. Has had 6 bottom teeth removed, difficulty chewing.   Wife reports progressive weakness. Multiple falls.  Fell in bathroom  yesterday. Using walker. Weak after dialysis, which is new.   Stool softeners for constipation, then goes too much and takes imodium. No melena or hematochezia. Some mucus output from rectum.     No prior EGD. Colonoscopy remote past at [facility] but unable to see reports.  No FH colon cancer, colon polyps, esophageal cancer, gastric cancer.      Past Medical History:  Diagnosis Date   Arthritis    lower back   Chronic back pain    Disc disease   Chronic kidney disease    Coronary atherosclerosis of native coronary artery    Coronary calcifications by chest CT, Myoview demonstrating inferolateral scar   Deafness in right ear    Diabetes mellitus    Diabetic retinopathy (HCC)    moderate nonproliferative with macular edema in right eye   Diastolic dysfunction 12/2011   Grade 1.   Essential hypertension, benign    Heart murmur    in past   HOH (hard of hearing)    Hyperkalemia    Kidney stones    Mixed hyperlipidemia    NAFLD (nonalcoholic fatty liver disease)    Pancreatic insufficiency    Diagnosed at [facility]   Prostate cancer [facility]) 2011   Shortness of breath dyspnea    occasional - liver pressing on right lung, decreased capacity   Subdural hematoma (HCC)    Type 2 diabetes mellitus (HCC)    Wears hearing aid    left    Past Surgical History:  Procedure Laterality Date   A/V FISTULAGRAM Left 07/02/2023   Procedure: A/V Fistulagram;  Surgeon: Daria Pastures, MD;  Location: [facility] INVASIVE CV LAB;  Service: Vascular;  Laterality: Left;   APPENDECTOMY     AV FISTULA PLACEMENT Left 05/15/2021   Procedure: INSERTION OF LEFT ARM ARTERIOVENOUS (AV) GORE-TEX GRAFT;  Surgeon: Larina Earthly, MD;  Location: [facility];  Service: Vascular;  Laterality: Left;   Bilateral hip replacement     CATARACT EXTRACTION W/PHACO Left 05/15/2015   Procedure: CATARACT EXTRACTION PHACO AND INTRAOCULAR LENS PLACEMENT (IOC);  Surgeon: Sherald Hess, MD;  Location: [facility];   Service: Ophthalmology;  Laterality: Left;  DIABETIC - insulin pump and oral meds   CATARACT EXTRACTION W/PHACO Right 08/28/2015   Procedure: CATARACT EXTRACTION PHACO AND INTRAOCULAR LENS PLACEMENT (IOC);  Surgeon: Sherald Hess, MD;  Location: [facility];  Service: Ophthalmology;  Laterality: Right;  DIABETIC PER PT AND CINDY PLEASE KEEP ARRIVAL TIME AFTER 8AM    CHOLECYSTECTOMY     CYSTOSCOPY W/ URETERAL STENT PLACEMENT Right 05/29/2023   Procedure: CYSTOSCOPY WITH RETROGRADE PYELOGRAM/URETERAL STENT PLACEMENT;  Surgeon: Malen Gauze, MD;  Location: [facility];  Service: Urology;  Laterality: Right;   CYSTOSCOPY WITH RETROGRADE PYELOGRAM, URETEROSCOPY AND STENT PLACEMENT Right 07/24/2023   Procedure: CYSTOSCOPY WITH RETROGRADE PYELOGRAM, URETEROSCOPY AND STENT PLACEMENT;  Surgeon: Malen Gauze, MD;  Location: [facility];  Service: Urology;  Laterality: Right;  dialysis pt   HERNIA REPAIR     INSERTION OF DIALYSIS CATHETER Right 01/22/2021   Procedure: INSERTION OF DIALYSIS CATHETER;  Surgeon: Lucretia Roers, MD;  Location: [facility];  Service: General;  Laterality: Right;   INSERTION OF DIALYSIS CATHETER Right 04/16/2021   Procedure: INSERTION OF DIALYSIS CATHETER;  Surgeon: Lucretia Roers, MD;  Location: [facility];  Service: General;  Laterality: Right;   PERIPHERAL VASCULAR BALLOON ANGIOPLASTY  07/02/2023   Procedure: PERIPHERAL VASCULAR BALLOON ANGIOPLASTY;  Surgeon: Daria Pastures, MD;  Location: [facility] INVASIVE CV LAB;  Service: Vascular;;  lt arm fistula   PERIPHERAL VASCULAR BALLOON ANGIOPLASTY  09/01/2023   Procedure: PERIPHERAL VASCULAR BALLOON ANGIOPLASTY;  Surgeon: Ethelene Hal, MD;  Location: [facility] INVASIVE CV LAB;  Service: Cardiovascular;;  50% Venous Anastomosis; 70% Intragraft   PERIPHERAL VASCULAR THROMBECTOMY N/A 09/01/2023   Procedure: PERIPHERAL VASCULAR THROMBECTOMY;  Surgeon: Ethelene Hal, MD;  Location: [facility] INVASIVE CV LAB;  Service: Cardiovascular;   Laterality: N/A;   PROSTATECTOMY  2011   STONE EXTRACTION WITH BASKET Right 07/24/2023   Procedure: STONE EXTRACTION WITH BASKET;  Surgeon: Malen Gauze, MD;  Location: [facility];  Service: Urology;  Laterality: Right;    Prior to Admission medications   Medication Sig Start Date End Date Taking? Authorizing Provider  acetaminophen (TYLENOL) 500 MG tablet Take 500-1,000 mg by mouth every 6 (six) hours as needed (PAIN.).    [provider]  ARIPiprazole (ABILIFY) 2 MG tablet Take 1 tablet (2 mg total) by mouth daily. Patient taking differently: Take 2 mg by mouth at bedtime. 08/21/23   Donita Brooks, MD  Continuous Glucose Sensor (FREESTYLE LIBRE 3 PLUS SENSOR) MISC Change sensor every 15 days. 06/02/23   Donita Brooks, MD  diazepam (VALIUM) 2 MG tablet Take 1 tablet (2 mg total) by mouth at bedtime. Patient not taking: Reported on 08/21/2023 07/18/23   Donita Brooks, MD  diltiazem (CARDIZEM CD) 240 MG 24 hr capsule TAKE 1 CAPSULE(240 MG) BY MOUTH DAILY 09/30/22   Donita Brooks, MD  HYDROcodone-acetaminophen (NORCO/VICODIN) 5-325 MG tablet Take 1 tablet by  mouth every 12 (twelve) hours as needed for severe pain (pain score 7-10). Must last 30 days. 09/10/23 10/10/23  Edward Jolly, MD  insulin aspart (NOVOLOG) 100 UNIT/ML injection Inject 8 Units into the skin 3 (three) times daily with meals. Patient taking differently: Inject 2 Units into the skin every Monday, Wednesday, and Friday. 02/06/21   Sherryll Burger, Pratik D, DO  insulin glargine (LANTUS) 100 UNIT/ML injection Inject 0.12 mLs (12 Units total) into the skin at bedtime. Patient taking differently: Inject 6 Units into the skin every Tuesday, Thursday, Saturday, and Sunday at 6 PM. 03/20/21   Pickard, Priscille Heidelberg, MD  lidocaine-prilocaine (EMLA) cream APPLY SMALL AMOUNT TO ACCESS SITE (AVF) 30 MINUTES BEFORE DIALYSIS. COVER WITH OCCLUSIVE DRESSING Jasper Riling WRAP) 08/28/23   Donita Brooks, MD  pantoprazole (PROTONIX) 40 MG tablet  Take 1 tablet (40 mg total) by mouth daily. 08/28/23   Donita Brooks, MD  pravastatin (PRAVACHOL) 10 MG tablet TAKE 1 TABLET(10 MG) BY MOUTH DAILY WITH BREAKFAST 03/14/23   Donita Brooks, MD  sertraline (ZOLOFT) 50 MG tablet Take 1 tablet (50 mg total) by mouth daily. 09/04/23   Donita Brooks, MD  sevelamer carbonate (RENVELA) 800 MG tablet Take 2,400 mg by mouth 3 (three) times daily with meals. 05/09/23   [provider]    No current facility-administered medications for this encounter.   Current Outpatient Medications  Medication Sig Dispense Refill   acetaminophen (TYLENOL) 500 MG tablet Take 500-1,000 mg by mouth every 6 (six) hours as needed (PAIN.).     ARIPiprazole (ABILIFY) 2 MG tablet Take 1 tablet (2 mg total) by mouth daily. (Patient taking differently: Take 2 mg by mouth at bedtime.) 30 tablet 3   Continuous Glucose Sensor (FREESTYLE LIBRE 3 PLUS SENSOR) MISC Change sensor every 15 days. 2 each 6   diazepam (VALIUM) 2 MG tablet Take 1 tablet (2 mg total) by mouth at bedtime. (Patient not taking: Reported on 08/21/2023) 30 tablet 0   diltiazem (CARDIZEM CD) 240 MG 24 hr capsule TAKE 1 CAPSULE(240 MG) BY MOUTH DAILY 90 capsule 3   HYDROcodone-acetaminophen (NORCO/VICODIN) 5-325 MG tablet Take 1 tablet by mouth every 12 (twelve) hours as needed for severe pain (pain score 7-10). Must last 30 days. 60 tablet 0   insulin aspart (NOVOLOG) 100 UNIT/ML injection Inject 8 Units into the skin 3 (three) times daily with meals. (Patient taking differently: Inject 2 Units into the skin every Monday, Wednesday, and Friday.) 10 mL 11   insulin glargine (LANTUS) 100 UNIT/ML injection Inject 0.12 mLs (12 Units total) into the skin at bedtime. (Patient taking differently: Inject 6 Units into the skin every Tuesday, Thursday, Saturday, and Sunday at 6 PM.) 10 mL 11   lidocaine-prilocaine (EMLA) cream APPLY SMALL AMOUNT TO ACCESS SITE (AVF) 30 MINUTES BEFORE DIALYSIS. COVER WITH OCCLUSIVE  DRESSING (SARAN WRAP) 60 g 11   pantoprazole (PROTONIX) 40 MG tablet Take 1 tablet (40 mg total) by mouth daily. 30 tablet 3   pravastatin (PRAVACHOL) 10 MG tablet TAKE 1 TABLET(10 MG) BY MOUTH DAILY WITH BREAKFAST 90 tablet 1   sertraline (ZOLOFT) 50 MG tablet Take 1 tablet (50 mg total) by mouth daily. 90 tablet 3   sevelamer carbonate (RENVELA) 800 MG tablet Take 2,400 mg by mouth 3 (three) times daily with meals.      Allergies as of 09/16/2023 - Review Complete 09/16/2023  Allergen Reaction Noted   Enalapril maleate Cough 09/26/2012    Family History  Problem  Relation Age of Onset   Diabetes type II Mother    Heart attack Father    Colon cancer Neg Hx    Gastric cancer Neg Hx    Esophageal cancer Neg Hx     Social History   Socioeconomic History   Marital status: Married    Spouse name: Darlene   Number of children: 3   Years of education: Not on file   Highest education level: Not on file  Occupational History   Not on file  Tobacco Use   Smoking status: Never   Smokeless tobacco: Never  Vaping Use   Vaping status: Never Used  Substance and Sexual Activity   Alcohol use: No   Drug use: No   Sexual activity: Yes  Other Topics Concern   Not on file  Social History Narrative   3 children, 1 daughter and 1 son sorry deceased. 1 daughter living in [place] and 1 step child   Married x 13 years 08/25/21.   7 grandchildren   1 great grandchild   Social Drivers of Corporate investment banker Strain: Low Risk  (08/22/2022)   Overall Financial Resource Strain (CARDIA)    Difficulty of Paying Living Expenses: Not hard at all  Food Insecurity: No Food Insecurity (06/05/2023)   Hunger Vital Sign    Worried About Running Out of Food in the Last Year: Never true    Ran Out of Food in the Last Year: Never true  Transportation Needs: No Transportation Needs (06/05/2023)   PRAPARE - Administrator, Civil Service (Medical): No    Lack of Transportation  (Non-Medical): No  Physical Activity: Inactive (08/22/2022)   Exercise Vital Sign    Days of Exercise per Week: 0 days    Minutes of Exercise per Session: 0 min  Stress: No Stress Concern Present (08/22/2022)   [employer] of Occupational Health - Occupational Stress Questionnaire    Feeling of Stress : Not at all  Social Connections: Moderately Isolated (08/22/2022)   Social Connection and Isolation Panel [NHANES]    Frequency of Communication with Friends and Family: More than three times a week    Frequency of Social Gatherings with Friends and Family: Three times a week    Attends Religious Services: Never    Active Member of Clubs or Organizations: No    Attends Banker Meetings: Never    Marital Status: Married  Catering manager Violence: Not At Risk (06/05/2023)   Humiliation, Afraid, Rape, and Kick questionnaire    Fear of Current or Ex-Partner: No    Emotionally Abused: No    Physically Abused: No    Sexually Abused: No     Review of Systems   Gen: Denies any fever, chills, loss of appetite, change in weight or weight loss CV: Denies chest pain, heart palpitations, syncope, edema  Resp: Denies shortness of breath with rest, cough, wheezing, coughing up blood, and pleurisy. GI: Denies vomiting blood, jaundice, and fecal incontinence.   Denies dysphagia or odynophagia. GU : Denies urinary burning, blood in urine, urinary frequency, and urinary incontinence. MS: Denies joint pain, limitation of movement, swelling, cramps, and atrophy.  Derm: Denies rash, itching, dry skin, hives. Psych: Denies depression, anxiety, memory loss, hallucinations, and confusion. Heme: Denies bruising or bleeding Neuro:  Denies any headaches, dizziness, paresthesias, shaking  Physical Exam   Vital Signs in last 24 hours: Temp:  [97.7 F (36.5 C)] 97.7 F (36.5 C) (02/11 0910) Pulse Rate:  [  80-83] 80 (02/11 1136) Resp:  [11] 11 (02/11 1136) BP: (109)/(51) 109/51 (02/11  0910) SpO2:  [90 %-98 %] 90 % (02/11 1136) Weight:  [64.9 kg] 64.9 kg (02/11 0919)    General:   Alert, oriented X 4, chronically ill-appearing and frail, weak Head:  Normocephalic and atraumatic. Eyes:  Sclera clear, no icterus.   Ears:  hard of hearing Lungs:  Clear throughout to auscultation.    Heart:  S1 s2 with 3/6 systolic murmur Abdomen:  Soft, nontender and nondistended. Thin. No masses, hepatosplenomegaly or hernias noted. Normal bowel sounds, without guarding, and without rebound.   Rectal: deferred   Msk:  thin upper extremities Extremities:  Without  edema. Neurologic:  Alert and  oriented x4. Psych:  Alert and cooperative. Normal mood and affect.  Intake/Output from previous day: No intake/output data recorded. Intake/Output this shift: No intake/output data recorded.    Labs/Studies   Recent Labs Recent Labs    09/16/23 0957  WBC 10.6*  HGB 7.8*  HCT 25.4*  PLT 106*   BMET Recent Labs    09/16/23 0957  NA 133*  K 4.4  CL 96*  CO2 26  GLUCOSE 152*  BUN 35*  CREATININE 2.21*  CALCIUM 7.7*   LFT Recent Labs    09/16/23 0957  PROT 4.5*  ALBUMIN 1.6*  AST 35  ALT 34  ALKPHOS 323*  BILITOT 0.7     Radiology/Studies DG Chest Port 1 View Result Date: 09/16/2023 CLINICAL DATA:  Weakness and cough. EXAM: PORTABLE CHEST 1 VIEW COMPARISON:  Chest radiograph dated 08/15/2023. FINDINGS: Shallow inspiration. There is mild elevation of the right hemidiaphragm. Bibasilar atelectasis. No focal consolidation, pleural effusion, pneumothorax. Atherosclerotic calcification of the aorta. No acute osseous pathology. IMPRESSION: No acute findings. Electronically Signed   By: Elgie Collard M.D.   On: 09/16/2023 11:32   CT Head Wo Contrast Result Date: 09/16/2023 CLINICAL DATA:  Provided history: Head trauma, moderate/severe. Headache, neuro deficit. Additional history obtained from electronic MEDICAL RECORD NUMBERWeakness, difficulty swallowing food. EXAM: CT  HEAD WITHOUT CONTRAST TECHNIQUE: Contiguous axial images were obtained from the base of the skull through the vertex without intravenous contrast. RADIATION DOSE REDUCTION: This exam was performed according to the departmental dose-optimization program which includes automated exposure control, adjustment of the mA and/or kV according to patient size and/or use of iterative reconstruction technique. COMPARISON:  Non-contrast head CT 08/15/2023. Brain MRI 09/06/2013. FINDINGS: Brain: Generalized cerebral atrophy. Patchy and ill-defined hypoattenuation within the cerebral white matter, nonspecific but compatible with mild-to-moderate chronic small vessel ischemic disease. There is no acute intracranial hemorrhage. No demarcated cortical infarct. No extra-axial fluid collection. No evidence of an intracranial mass. No midline shift. Vascular: No hyperdense vessel.  Atherosclerotic calcifications. Skull: No calvarial fracture or aggressive osseous lesion. Sinuses/Orbits: No mass or acute finding within the imaged orbits. 15 mm polypoid soft tissue focus, and minimal background mucosal thickening, within the right maxillary sinus. Minimal mucosal thickening within the left maxillary sinus at the imaged levels. Other: Small-volume fluid within right mastoid air cells. IMPRESSION: 1.  No evidence of an acute intracranial abnormality. 2. Parenchymal atrophy and chronic small vessel ischemic disease. 3. Paranasal sinus disease at the imaged levels (including a 15 mm polypoid soft tissue focus within the right maxillary sinus). Consider ENT referral. 4. Small right mastoid effusion. Electronically Signed   By: Jackey Loge D.O.   On: 09/16/2023 11:00     Assessment   Ermin L Seelye Sr. is an 83 y.o.  year old male with multi-morbidities including CAD, hyperlipidemia, HTN, DM, SVT, ESRD on dialysis, heart murmur, hard of hearing, presenting to the ED with progressive functional decline, failure to thrive, noted over  past 6 months but now escalating specifically over past few weeks. Due to reports of dysphagia and regurgitation, GI has been consulted.    Dysphagia: new onset over past few weeks and no obvious reports of odynophagia. New onset GERD also noted, without improvement on PPI although has only been on a week. Patient and wife deny any prior history of chronic GERD or need for PPI. Overall, he has had progressive decline functionally, weakness, decreased appetite, and reported significant weight loss over the past year with reported early satiety. Dysphagia multifactorial and do suspect may have dysmotility underlying but unable to rule out malignancy, stricture, etc. Constellation of symptoms is concerning.   Elevated alk phos: isolated elevation. Suspect driven by known ESRD. Can pursue evaluation as outpatient. CT abd/pelvis with contrast Jan 2025 with normal spleen, stable tiny liver cyst.   Anemia: Hgb 7.8, previously 8.2 a few weeks ago. Appears fluctuates around 8/9. No overt GI bleeding. Anemia panel in process. Suspect nutritional deficiencies, ESRD as culprit.     Plan / Recommendations    NPO after midnight PPI IV BID Follow-up on pending anemia panel Tentatively for EGD/dilation on Wednesday Doubt would be able to tolerate UGI/BPE due to limited mobility, weakness     09/16/2023, 12:10 PM  Gelene Mink, PhD, [facility] Gastroenterology

## 2023-09-16 NOTE — H&P (View-Only) (Signed)
Gastroenterology Consult   Referring Provider: Dr. Derwood Kaplan, [facility] phyisican Primary Care Physician:  Donita Brooks, MD Primary Gastroenterologist:  Dr. Levon Hedger, previously unassigned  Patient ID: Mark HEIER Sr.; [id number]; [birth year]   Admit date: 09/16/2023  LOS: 0 days   Date of Consultation: 09/16/2023  Reason for Consultation:  failure to thrive, regurgitation, dysphagia  History of Present Illness   Mark L Mceachron Sr. is an 83 y.o. year old male with multi-morbidities including CAD, hyperlipidemia, HTN, DM, SVT, ESRD on dialysis, heart murmur, hard of hearing, presenting to the ED with progressive functional decline, failure to thrive, noted over past 6 months but now escalating specifically over past few weeks. Due to reports of dysphagia and regurgitation, GI has been consulted.    In the ED: Hgb 7.8, with baseline around 8/9 range. WBC count 10.6, MCV 109, platelets 106, sodium 133, creatinine 2.21, Albumin 1.6, Alk phos 323. CXR no acute findings. CT head without contrast with parenchymal atrophy and chronic small vessel ischemic disease, paranasal sinus disease with 15 mm polypoid soft tissue focus within right maxillary sinus, small right mastoid effusion.   At bedside: Wife with patient and provides majority of history due to his difficulty hearing. Dialysis M, W, F. Tries to eat and gags then spits up. As soon as goes down, comes back up. PCP placed on pantoprazole a week or so ago. Gagging. Oatmeal can slide down ok but at times difficult. Liquid comes out of nose. Symptoms present for at least 2 weeks but family member feels longer. No abdominal pain. Has had more heartburn in last week. New onset. No chronic hx of GERD or use of PPI.   218 lbs to 142 within the past few years unintentionally. Decreased oral intake. Has had 6 bottom teeth removed, difficulty chewing.   Wife reports progressive weakness. Multiple falls.  Fell in bathroom  yesterday. Using walker. Weak after dialysis, which is new.   Stool softeners for constipation, then goes too much and takes imodium. No melena or hematochezia. Some mucus output from rectum.     No prior EGD. Colonoscopy remote past at [facility] but unable to see reports.  No FH colon cancer, colon polyps, esophageal cancer, gastric cancer.      Past Medical History:  Diagnosis Date   Arthritis    lower back   Chronic back pain    Disc disease   Chronic kidney disease    Coronary atherosclerosis of native coronary artery    Coronary calcifications by chest CT, Myoview demonstrating inferolateral scar   Deafness in right ear    Diabetes mellitus    Diabetic retinopathy (HCC)    moderate nonproliferative with macular edema in right eye   Diastolic dysfunction 12/2011   Grade 1.   Essential hypertension, benign    Heart murmur    in past   HOH (hard of hearing)    Hyperkalemia    Kidney stones    Mixed hyperlipidemia    NAFLD (nonalcoholic fatty liver disease)    Pancreatic insufficiency    Diagnosed at [facility]   Prostate cancer [facility]) 2011   Shortness of breath dyspnea    occasional - liver pressing on right lung, decreased capacity   Subdural hematoma (HCC)    Type 2 diabetes mellitus (HCC)    Wears hearing aid    left    Past Surgical History:  Procedure Laterality Date   A/V FISTULAGRAM Left 07/02/2023   Procedure: A/V Fistulagram;  Surgeon: Daria Pastures, MD;  Location: [facility] INVASIVE CV LAB;  Service: Vascular;  Laterality: Left;   APPENDECTOMY     AV FISTULA PLACEMENT Left 05/15/2021   Procedure: INSERTION OF LEFT ARM ARTERIOVENOUS (AV) GORE-TEX GRAFT;  Surgeon: Larina Earthly, MD;  Location: [facility];  Service: Vascular;  Laterality: Left;   Bilateral hip replacement     CATARACT EXTRACTION W/PHACO Left 05/15/2015   Procedure: CATARACT EXTRACTION PHACO AND INTRAOCULAR LENS PLACEMENT (IOC);  Surgeon: Sherald Hess, MD;  Location: [facility];   Service: Ophthalmology;  Laterality: Left;  DIABETIC - insulin pump and oral meds   CATARACT EXTRACTION W/PHACO Right 08/28/2015   Procedure: CATARACT EXTRACTION PHACO AND INTRAOCULAR LENS PLACEMENT (IOC);  Surgeon: Sherald Hess, MD;  Location: [facility];  Service: Ophthalmology;  Laterality: Right;  DIABETIC PER PT AND CINDY PLEASE KEEP ARRIVAL TIME AFTER 8AM    CHOLECYSTECTOMY     CYSTOSCOPY W/ URETERAL STENT PLACEMENT Right 05/29/2023   Procedure: CYSTOSCOPY WITH RETROGRADE PYELOGRAM/URETERAL STENT PLACEMENT;  Surgeon: Malen Gauze, MD;  Location: [facility];  Service: Urology;  Laterality: Right;   CYSTOSCOPY WITH RETROGRADE PYELOGRAM, URETEROSCOPY AND STENT PLACEMENT Right 07/24/2023   Procedure: CYSTOSCOPY WITH RETROGRADE PYELOGRAM, URETEROSCOPY AND STENT PLACEMENT;  Surgeon: Malen Gauze, MD;  Location: [facility];  Service: Urology;  Laterality: Right;  dialysis pt   HERNIA REPAIR     INSERTION OF DIALYSIS CATHETER Right 01/22/2021   Procedure: INSERTION OF DIALYSIS CATHETER;  Surgeon: Lucretia Roers, MD;  Location: [facility];  Service: General;  Laterality: Right;   INSERTION OF DIALYSIS CATHETER Right 04/16/2021   Procedure: INSERTION OF DIALYSIS CATHETER;  Surgeon: Lucretia Roers, MD;  Location: [facility];  Service: General;  Laterality: Right;   PERIPHERAL VASCULAR BALLOON ANGIOPLASTY  07/02/2023   Procedure: PERIPHERAL VASCULAR BALLOON ANGIOPLASTY;  Surgeon: Daria Pastures, MD;  Location: [facility] INVASIVE CV LAB;  Service: Vascular;;  lt arm fistula   PERIPHERAL VASCULAR BALLOON ANGIOPLASTY  09/01/2023   Procedure: PERIPHERAL VASCULAR BALLOON ANGIOPLASTY;  Surgeon: Ethelene Hal, MD;  Location: [facility] INVASIVE CV LAB;  Service: Cardiovascular;;  50% Venous Anastomosis; 70% Intragraft   PERIPHERAL VASCULAR THROMBECTOMY N/A 09/01/2023   Procedure: PERIPHERAL VASCULAR THROMBECTOMY;  Surgeon: Ethelene Hal, MD;  Location: [facility] INVASIVE CV LAB;  Service: Cardiovascular;   Laterality: N/A;   PROSTATECTOMY  2011   STONE EXTRACTION WITH BASKET Right 07/24/2023   Procedure: STONE EXTRACTION WITH BASKET;  Surgeon: Malen Gauze, MD;  Location: [facility];  Service: Urology;  Laterality: Right;    Prior to Admission medications   Medication Sig Start Date End Date Taking? Authorizing Provider  acetaminophen (TYLENOL) 500 MG tablet Take 500-1,000 mg by mouth every 6 (six) hours as needed (PAIN.).    [provider]  ARIPiprazole (ABILIFY) 2 MG tablet Take 1 tablet (2 mg total) by mouth daily. Patient taking differently: Take 2 mg by mouth at bedtime. 08/21/23   Donita Brooks, MD  Continuous Glucose Sensor (FREESTYLE LIBRE 3 PLUS SENSOR) MISC Change sensor every 15 days. 06/02/23   Donita Brooks, MD  diazepam (VALIUM) 2 MG tablet Take 1 tablet (2 mg total) by mouth at bedtime. Patient not taking: Reported on 08/21/2023 07/18/23   Donita Brooks, MD  diltiazem (CARDIZEM CD) 240 MG 24 hr capsule TAKE 1 CAPSULE(240 MG) BY MOUTH DAILY 09/30/22   Donita Brooks, MD  HYDROcodone-acetaminophen (NORCO/VICODIN) 5-325 MG tablet Take 1 tablet by  mouth every 12 (twelve) hours as needed for severe pain (pain score 7-10). Must last 30 days. 09/10/23 10/10/23  Edward Jolly, MD  insulin aspart (NOVOLOG) 100 UNIT/ML injection Inject 8 Units into the skin 3 (three) times daily with meals. Patient taking differently: Inject 2 Units into the skin every Monday, Wednesday, and Friday. 02/06/21   Sherryll Burger, Pratik D, DO  insulin glargine (LANTUS) 100 UNIT/ML injection Inject 0.12 mLs (12 Units total) into the skin at bedtime. Patient taking differently: Inject 6 Units into the skin every Tuesday, Thursday, Saturday, and Sunday at 6 PM. 03/20/21   Pickard, Priscille Heidelberg, MD  lidocaine-prilocaine (EMLA) cream APPLY SMALL AMOUNT TO ACCESS SITE (AVF) 30 MINUTES BEFORE DIALYSIS. COVER WITH OCCLUSIVE DRESSING Jasper Riling WRAP) 08/28/23   Donita Brooks, MD  pantoprazole (PROTONIX) 40 MG tablet  Take 1 tablet (40 mg total) by mouth daily. 08/28/23   Donita Brooks, MD  pravastatin (PRAVACHOL) 10 MG tablet TAKE 1 TABLET(10 MG) BY MOUTH DAILY WITH BREAKFAST 03/14/23   Donita Brooks, MD  sertraline (ZOLOFT) 50 MG tablet Take 1 tablet (50 mg total) by mouth daily. 09/04/23   Donita Brooks, MD  sevelamer carbonate (RENVELA) 800 MG tablet Take 2,400 mg by mouth 3 (three) times daily with meals. 05/09/23   [provider]    No current facility-administered medications for this encounter.   Current Outpatient Medications  Medication Sig Dispense Refill   acetaminophen (TYLENOL) 500 MG tablet Take 500-1,000 mg by mouth every 6 (six) hours as needed (PAIN.).     ARIPiprazole (ABILIFY) 2 MG tablet Take 1 tablet (2 mg total) by mouth daily. (Patient taking differently: Take 2 mg by mouth at bedtime.) 30 tablet 3   Continuous Glucose Sensor (FREESTYLE LIBRE 3 PLUS SENSOR) MISC Change sensor every 15 days. 2 each 6   diazepam (VALIUM) 2 MG tablet Take 1 tablet (2 mg total) by mouth at bedtime. (Patient not taking: Reported on 08/21/2023) 30 tablet 0   diltiazem (CARDIZEM CD) 240 MG 24 hr capsule TAKE 1 CAPSULE(240 MG) BY MOUTH DAILY 90 capsule 3   HYDROcodone-acetaminophen (NORCO/VICODIN) 5-325 MG tablet Take 1 tablet by mouth every 12 (twelve) hours as needed for severe pain (pain score 7-10). Must last 30 days. 60 tablet 0   insulin aspart (NOVOLOG) 100 UNIT/ML injection Inject 8 Units into the skin 3 (three) times daily with meals. (Patient taking differently: Inject 2 Units into the skin every Monday, Wednesday, and Friday.) 10 mL 11   insulin glargine (LANTUS) 100 UNIT/ML injection Inject 0.12 mLs (12 Units total) into the skin at bedtime. (Patient taking differently: Inject 6 Units into the skin every Tuesday, Thursday, Saturday, and Sunday at 6 PM.) 10 mL 11   lidocaine-prilocaine (EMLA) cream APPLY SMALL AMOUNT TO ACCESS SITE (AVF) 30 MINUTES BEFORE DIALYSIS. COVER WITH OCCLUSIVE  DRESSING (SARAN WRAP) 60 g 11   pantoprazole (PROTONIX) 40 MG tablet Take 1 tablet (40 mg total) by mouth daily. 30 tablet 3   pravastatin (PRAVACHOL) 10 MG tablet TAKE 1 TABLET(10 MG) BY MOUTH DAILY WITH BREAKFAST 90 tablet 1   sertraline (ZOLOFT) 50 MG tablet Take 1 tablet (50 mg total) by mouth daily. 90 tablet 3   sevelamer carbonate (RENVELA) 800 MG tablet Take 2,400 mg by mouth 3 (three) times daily with meals.      Allergies as of 09/16/2023 - Review Complete 09/16/2023  Allergen Reaction Noted   Enalapril maleate Cough 09/26/2012    Family History  Problem  Relation Age of Onset   Diabetes type II Mother    Heart attack Father    Colon cancer Neg Hx    Gastric cancer Neg Hx    Esophageal cancer Neg Hx     Social History   Socioeconomic History   Marital status: Married    Spouse name: Darlene   Number of children: 3   Years of education: Not on file   Highest education level: Not on file  Occupational History   Not on file  Tobacco Use   Smoking status: Never   Smokeless tobacco: Never  Vaping Use   Vaping status: Never Used  Substance and Sexual Activity   Alcohol use: No   Drug use: No   Sexual activity: Yes  Other Topics Concern   Not on file  Social History Narrative   3 children, 1 daughter and 1 son sorry deceased. 1 daughter living in [place] and 1 step child   Married x 13 years 08/25/21.   7 grandchildren   1 great grandchild   Social Drivers of Corporate investment banker Strain: Low Risk  (08/22/2022)   Overall Financial Resource Strain (CARDIA)    Difficulty of Paying Living Expenses: Not hard at all  Food Insecurity: No Food Insecurity (06/05/2023)   Hunger Vital Sign    Worried About Running Out of Food in the Last Year: Never true    Ran Out of Food in the Last Year: Never true  Transportation Needs: No Transportation Needs (06/05/2023)   PRAPARE - Administrator, Civil Service (Medical): No    Lack of Transportation  (Non-Medical): No  Physical Activity: Inactive (08/22/2022)   Exercise Vital Sign    Days of Exercise per Week: 0 days    Minutes of Exercise per Session: 0 min  Stress: No Stress Concern Present (08/22/2022)   [employer] of Occupational Health - Occupational Stress Questionnaire    Feeling of Stress : Not at all  Social Connections: Moderately Isolated (08/22/2022)   Social Connection and Isolation Panel [NHANES]    Frequency of Communication with Friends and Family: More than three times a week    Frequency of Social Gatherings with Friends and Family: Three times a week    Attends Religious Services: Never    Active Member of Clubs or Organizations: No    Attends Banker Meetings: Never    Marital Status: Married  Catering manager Violence: Not At Risk (06/05/2023)   Humiliation, Afraid, Rape, and Kick questionnaire    Fear of Current or Ex-Partner: No    Emotionally Abused: No    Physically Abused: No    Sexually Abused: No     Review of Systems   Gen: Denies any fever, chills, loss of appetite, change in weight or weight loss CV: Denies chest pain, heart palpitations, syncope, edema  Resp: Denies shortness of breath with rest, cough, wheezing, coughing up blood, and pleurisy. GI: Denies vomiting blood, jaundice, and fecal incontinence.   Denies dysphagia or odynophagia. GU : Denies urinary burning, blood in urine, urinary frequency, and urinary incontinence. MS: Denies joint pain, limitation of movement, swelling, cramps, and atrophy.  Derm: Denies rash, itching, dry skin, hives. Psych: Denies depression, anxiety, memory loss, hallucinations, and confusion. Heme: Denies bruising or bleeding Neuro:  Denies any headaches, dizziness, paresthesias, shaking  Physical Exam   Vital Signs in last 24 hours: Temp:  [97.7 F (36.5 C)] 97.7 F (36.5 C) (02/11 0910) Pulse Rate:  [  80-83] 80 (02/11 1136) Resp:  [11] 11 (02/11 1136) BP: (109)/(51) 109/51 (02/11  0910) SpO2:  [90 %-98 %] 90 % (02/11 1136) Weight:  [64.9 kg] 64.9 kg (02/11 0919)    General:   Alert, oriented X 4, chronically ill-appearing and frail, weak Head:  Normocephalic and atraumatic. Eyes:  Sclera clear, no icterus.   Ears:  hard of hearing Lungs:  Clear throughout to auscultation.    Heart:  S1 s2 with 3/6 systolic murmur Abdomen:  Soft, nontender and nondistended. Thin. No masses, hepatosplenomegaly or hernias noted. Normal bowel sounds, without guarding, and without rebound.   Rectal: deferred   Msk:  thin upper extremities Extremities:  Without  edema. Neurologic:  Alert and  oriented x4. Psych:  Alert and cooperative. Normal mood and affect.  Intake/Output from previous day: No intake/output data recorded. Intake/Output this shift: No intake/output data recorded.    Labs/Studies   Recent Labs Recent Labs    09/16/23 0957  WBC 10.6*  HGB 7.8*  HCT 25.4*  PLT 106*   BMET Recent Labs    09/16/23 0957  NA 133*  K 4.4  CL 96*  CO2 26  GLUCOSE 152*  BUN 35*  CREATININE 2.21*  CALCIUM 7.7*   LFT Recent Labs    09/16/23 0957  PROT 4.5*  ALBUMIN 1.6*  AST 35  ALT 34  ALKPHOS 323*  BILITOT 0.7     Radiology/Studies DG Chest Port 1 View Result Date: 09/16/2023 CLINICAL DATA:  Weakness and cough. EXAM: PORTABLE CHEST 1 VIEW COMPARISON:  Chest radiograph dated 08/15/2023. FINDINGS: Shallow inspiration. There is mild elevation of the right hemidiaphragm. Bibasilar atelectasis. No focal consolidation, pleural effusion, pneumothorax. Atherosclerotic calcification of the aorta. No acute osseous pathology. IMPRESSION: No acute findings. Electronically Signed   By: Elgie Collard M.D.   On: 09/16/2023 11:32   CT Head Wo Contrast Result Date: 09/16/2023 CLINICAL DATA:  Provided history: Head trauma, moderate/severe. Headache, neuro deficit. Additional history obtained from electronic MEDICAL RECORD NUMBERWeakness, difficulty swallowing food. EXAM: CT  HEAD WITHOUT CONTRAST TECHNIQUE: Contiguous axial images were obtained from the base of the skull through the vertex without intravenous contrast. RADIATION DOSE REDUCTION: This exam was performed according to the departmental dose-optimization program which includes automated exposure control, adjustment of the mA and/or kV according to patient size and/or use of iterative reconstruction technique. COMPARISON:  Non-contrast head CT 08/15/2023. Brain MRI 09/06/2013. FINDINGS: Brain: Generalized cerebral atrophy. Patchy and ill-defined hypoattenuation within the cerebral white matter, nonspecific but compatible with mild-to-moderate chronic small vessel ischemic disease. There is no acute intracranial hemorrhage. No demarcated cortical infarct. No extra-axial fluid collection. No evidence of an intracranial mass. No midline shift. Vascular: No hyperdense vessel.  Atherosclerotic calcifications. Skull: No calvarial fracture or aggressive osseous lesion. Sinuses/Orbits: No mass or acute finding within the imaged orbits. 15 mm polypoid soft tissue focus, and minimal background mucosal thickening, within the right maxillary sinus. Minimal mucosal thickening within the left maxillary sinus at the imaged levels. Other: Small-volume fluid within right mastoid air cells. IMPRESSION: 1.  No evidence of an acute intracranial abnormality. 2. Parenchymal atrophy and chronic small vessel ischemic disease. 3. Paranasal sinus disease at the imaged levels (including a 15 mm polypoid soft tissue focus within the right maxillary sinus). Consider ENT referral. 4. Small right mastoid effusion. Electronically Signed   By: Jackey Loge D.O.   On: 09/16/2023 11:00     Assessment   Mark L Seelye Sr. is an 83 y.o.  year old male with multi-morbidities including CAD, hyperlipidemia, HTN, DM, SVT, ESRD on dialysis, heart murmur, hard of hearing, presenting to the ED with progressive functional decline, failure to thrive, noted over  past 6 months but now escalating specifically over past few weeks. Due to reports of dysphagia and regurgitation, GI has been consulted.    Dysphagia: new onset over past few weeks and no obvious reports of odynophagia. New onset GERD also noted, without improvement on PPI although has only been on a week. Patient and wife deny any prior history of chronic GERD or need for PPI. Overall, he has had progressive decline functionally, weakness, decreased appetite, and reported significant weight loss over the past year with reported early satiety. Dysphagia multifactorial and do suspect may have dysmotility underlying but unable to rule out malignancy, stricture, etc. Constellation of symptoms is concerning.   Elevated alk phos: isolated elevation. Suspect driven by known ESRD. Can pursue evaluation as outpatient. CT abd/pelvis with contrast Jan 2025 with normal spleen, stable tiny liver cyst.   Anemia: Hgb 7.8, previously 8.2 a few weeks ago. Appears fluctuates around 8/9. No overt GI bleeding. Anemia panel in process. Suspect nutritional deficiencies, ESRD as culprit.     Plan / Recommendations    NPO after midnight PPI IV BID Follow-up on pending anemia panel Tentatively for EGD/dilation on Wednesday Doubt would be able to tolerate UGI/BPE due to limited mobility, weakness     09/16/2023, 12:10 PM  Gelene Mink, PhD, [facility] Gastroenterology

## 2023-09-16 NOTE — ED Provider Notes (Signed)
[place] EMERGENCY DEPARTMENT AT [facility] Provider Note   CSN: [id number] Arrival date & time: 09/16/23  4098     History  Chief Complaint  Patient presents with   Weakness    Mark L Renault Sr. is a 83 y.o. male.  HPI     83 y.o. male with a PMH of CAD, hyperlipidemia, hypertension, type 2 diabetes, SVT, and ESRD on HD (n/w/f).  Per wife, patient has been steadily declining over the past 6 months, but he has significantly worse in the last 2 weeks. Pt unable to walk, stand up even. He has had few falls at home. Pt also has been having difficulty with swallowing, he will choke and spit upon po intake. Pt has lost weight. Upon eating food patient will choke, spits it out. He has had problems with liquid intake as well. Dr. Tanya Nones is the PCP.  He put patient on Protonix, with the thought that if he does not improve they can consult him to GI.  No previous history of dysphagia per wife. Dr. Haynes Dage also suggest that the patient will likely need admission to the hospital for rehab admission.   Wife indicates that patient has had multiple falls in the last few days.  He is having difficulty getting out of bed and she has back issues limiting her ability to help.  Home Medications Prior to Admission medications   Medication Sig Start Date End Date Taking? Authorizing Provider  acetaminophen (TYLENOL) 500 MG tablet Take 500-1,000 mg by mouth every 6 (six) hours as needed (PAIN.).    [provider]  ARIPiprazole (ABILIFY) 2 MG tablet Take 1 tablet (2 mg total) by mouth daily. Patient taking differently: Take 2 mg by mouth at bedtime. 08/21/23   Donita Brooks, MD  Continuous Glucose Sensor (FREESTYLE LIBRE 3 PLUS SENSOR) MISC Change sensor every 15 days. 06/02/23   Donita Brooks, MD  diazepam (VALIUM) 2 MG tablet Take 1 tablet (2 mg total) by mouth at bedtime. Patient not taking: Reported on 08/21/2023 07/18/23   Donita Brooks, MD  diltiazem  (CARDIZEM CD) 240 MG 24 hr capsule TAKE 1 CAPSULE(240 MG) BY MOUTH DAILY 09/30/22   Donita Brooks, MD  HYDROcodone-acetaminophen (NORCO/VICODIN) 5-325 MG tablet Take 1 tablet by mouth every 12 (twelve) hours as needed for severe pain (pain score 7-10). Must last 30 days. 09/10/23 10/10/23  Edward Jolly, MD  insulin aspart (NOVOLOG) 100 UNIT/ML injection Inject 8 Units into the skin 3 (three) times daily with meals. Patient taking differently: Inject 2 Units into the skin every Monday, Wednesday, and Friday. 02/06/21   Sherryll Burger, Pratik D, DO  insulin glargine (LANTUS) 100 UNIT/ML injection Inject 0.12 mLs (12 Units total) into the skin at bedtime. Patient taking differently: Inject 6 Units into the skin every Tuesday, Thursday, Saturday, and Sunday at 6 PM. 03/20/21   Pickard, Priscille Heidelberg, MD  lidocaine-prilocaine (EMLA) cream APPLY SMALL AMOUNT TO ACCESS SITE (AVF) 30 MINUTES BEFORE DIALYSIS. COVER WITH OCCLUSIVE DRESSING Jasper Riling WRAP) 08/28/23   Donita Brooks, MD  pantoprazole (PROTONIX) 40 MG tablet Take 1 tablet (40 mg total) by mouth daily. 08/28/23   Donita Brooks, MD  pravastatin (PRAVACHOL) 10 MG tablet TAKE 1 TABLET(10 MG) BY MOUTH DAILY WITH BREAKFAST 03/14/23   Donita Brooks, MD  sertraline (ZOLOFT) 50 MG tablet Take 1 tablet (50 mg total) by mouth daily. 09/04/23   Donita Brooks, MD  sevelamer carbonate (RENVELA) 800 MG tablet Take  2,400 mg by mouth 3 (three) times daily with meals. 05/09/23   [provider]      Allergies    Enalapril maleate    Review of Systems   Review of Systems  All other systems reviewed and are negative.   Physical Exam Updated Vital Signs BP (!) 109/51 (BP Location: Right Arm)   Pulse 80   Temp 97.7 F (36.5 C) (Oral)   Resp 11   Ht 5\' 8"  (1.727 m)   Wt 64.9 kg   SpO2 90%   BMI 21.74 kg/m  Physical Exam Vitals and nursing note reviewed.  Constitutional:      Appearance: He is well-developed.  HENT:     Head: Atraumatic.   Cardiovascular:     Rate and Rhythm: Normal rate.  Pulmonary:     Effort: Pulmonary effort is normal.  Musculoskeletal:     Cervical back: Neck supple.  Skin:    General: Skin is warm.  Neurological:     General: No focal deficit present.     Mental Status: He is alert and oriented to person, place, and time.     ED Results / Procedures / Treatments   Labs (all labs ordered are listed, but only abnormal results are displayed) Labs Reviewed  COMPREHENSIVE METABOLIC PANEL - Abnormal; Notable for the following components:      Result Value   Sodium 133 (*)    Chloride 96 (*)    Glucose, Bld 152 (*)    BUN 35 (*)    Creatinine, Ser 2.21 (*)    Calcium 7.7 (*)    Total Protein 4.5 (*)    Albumin 1.6 (*)    Alkaline Phosphatase 323 (*)    GFR, Estimated 29 (*)    All other components within normal limits  CBC WITH DIFFERENTIAL/PLATELET - Abnormal; Notable for the following components:   WBC 10.6 (*)    RBC 2.33 (*)    Hemoglobin 7.8 (*)    HCT 25.4 (*)    MCV 109.0 (*)    Platelets 106 (*)    Neutro Abs 9.2 (*)    All other components within normal limits  RESP PANEL BY RT-PCR (RSV, FLU A&B, COVID)  RVPGX2  MAGNESIUM  PHOSPHORUS  CK  VITAMIN B12  FOLATE  IRON AND TIBC  FERRITIN  RETICULOCYTES    EKG  ED ECG REPORT   Date: 09/16/2023  Rate: 75  Rhythm: atrial fibrillation and indeterminate  QRS Axis: normal  Intervals: normal  ST/T Wave abnormalities: nonspecific ST/T changes  Conduction Disutrbances:none  Narrative Interpretation:   Old EKG Reviewed: changes noted  I have personally reviewed the EKG tracing and agree with the computerized printout as noted.  Repeat EKG ordered.  REPEAT EKG Interpretation Date/Time:  Tuesday September 16 2023 11:28:57 EST Ventricular Rate:  79 PR Interval:  176 QRS Duration:  134 QT Interval:  403 QTC Calculation: 462 R Axis:   -24  Text Interpretation: Sinus or ectopic atrial rhythm Right bundle branch block  no evidence of AF Confirmed by Derwood Kaplan ([id number]) on 09/16/2023 11:41:20 AM    Radiology DG Chest Port 1 View Result Date: 09/16/2023 CLINICAL DATA:  Weakness and cough. EXAM: PORTABLE CHEST 1 VIEW COMPARISON:  Chest radiograph dated 08/15/2023. FINDINGS: Shallow inspiration. There is mild elevation of the right hemidiaphragm. Bibasilar atelectasis. No focal consolidation, pleural effusion, pneumothorax. Atherosclerotic calcification of the aorta. No acute osseous pathology. IMPRESSION: No acute findings. Electronically Signed   By: Burtis Junes  Radparvar M.D.   On: 09/16/2023 11:32   CT Head Wo Contrast Result Date: 09/16/2023 CLINICAL DATA:  Provided history: Head trauma, moderate/severe. Headache, neuro deficit. Additional history obtained from electronic MEDICAL RECORD NUMBERWeakness, difficulty swallowing food. EXAM: CT HEAD WITHOUT CONTRAST TECHNIQUE: Contiguous axial images were obtained from the base of the skull through the vertex without intravenous contrast. RADIATION DOSE REDUCTION: This exam was performed according to the departmental dose-optimization program which includes automated exposure control, adjustment of the mA and/or kV according to patient size and/or use of iterative reconstruction technique. COMPARISON:  Non-contrast head CT 08/15/2023. Brain MRI 09/06/2013. FINDINGS: Brain: Generalized cerebral atrophy. Patchy and ill-defined hypoattenuation within the cerebral white matter, nonspecific but compatible with mild-to-moderate chronic small vessel ischemic disease. There is no acute intracranial hemorrhage. No demarcated cortical infarct. No extra-axial fluid collection. No evidence of an intracranial mass. No midline shift. Vascular: No hyperdense vessel.  Atherosclerotic calcifications. Skull: No calvarial fracture or aggressive osseous lesion. Sinuses/Orbits: No mass or acute finding within the imaged orbits. 15 mm polypoid soft tissue focus, and minimal background mucosal thickening,  within the right maxillary sinus. Minimal mucosal thickening within the left maxillary sinus at the imaged levels. Other: Small-volume fluid within right mastoid air cells. IMPRESSION: 1.  No evidence of an acute intracranial abnormality. 2. Parenchymal atrophy and chronic small vessel ischemic disease. 3. Paranasal sinus disease at the imaged levels (including a 15 mm polypoid soft tissue focus within the right maxillary sinus). Consider ENT referral. 4. Small right mastoid effusion. Electronically Signed   By: Jackey Loge D.O.   On: 09/16/2023 11:00    Procedures Procedures    Medications Ordered in ED Medications - No data to display  ED Course/ Medical Decision Making/ A&P                                 Medical Decision Making Amount and/or Complexity of Data Reviewed Labs: ordered. Radiology: ordered.  Risk Decision regarding hospitalization.   This patient presents to the ED with chief complaint(s) of worsening weakness, worsening dysphagia with pertinent past medical history of ESRD, CAD, diabetes.The complaint involves an extensive differential diagnosis and also carries with it a high risk of complications and morbidity.    The differential diagnosis includes : Severe electrolyte abnormality, upper respiratory illness including flu, severe anemia, acute on brain bleed, neuropathy, esophageal strictures, food impaction.  Patient has generalized weakness, no focal neurodeficits.  Low suspicion for primary neuromuscular disorder, but that is also a possibility given steady decline.  The initial plan is to get basic labs.  We will also engage GI doctors. Patient has history of dialysis, has been having multiple falls and difficulty getting to dialysis.  He will need admission to the hospital for GI workup and also will need PT-OT assessment while he is here for ultimate placement.   Additional history obtained: Additional history obtained from spouse Records reviewed  previous admission documents and Primary Care Documents  Independent labs interpretation:  The following labs were independently interpreted: Patient's hemoglobin is 7.8.  He has history of anemia of chronic disease, but this hemoglobin is lower than usual.  Last hemoglobin was 8.4. Electrolytes are at baseline normal.  Albumin is only 1.6.  Alk phos is 323. Patient is postcholecystectomy.  CT from 08-2023 noticed.  No abnormality in the hepatobiliary tract.  Independent visualization and interpretation of imaging: - I independently visualized the following imaging with  scope of interpretation limited to determining acute life threatening conditions related to emergency care: CT scan of the brain, which revealed no evidence of brain bleed.  Treatment and Reassessment: Patient reassessed.  Results of the ED workup discussed with him and family. Will proceed with admission request.  Repeat EKG reveals what appears to be sinus arrhythmia.  Patient denies any bloody stools.  GI seeing the patient, they plan to do EGD tomorrow.  Consultation: - Consulted or discussed management/test interpretation with external professional:  GI team.  They will scope the patient tomorrow.  Final Clinical Impression(s) / ED Diagnoses Final diagnoses:  Esophageal dysphagia  ESRD (end stage renal disease) on dialysis (HCC)  Acute on chronic anemia  Generalized weakness  Multiple falls    Rx / DC Orders ED Discharge Orders     None         Derwood Kaplan, MD 09/16/23 1145

## 2023-09-16 NOTE — Evaluation (Signed)
Occupational Therapy Evaluation Patient Details Name: Mark MENA Sr. MRN: [id number] DOB: 1940/12/24 Today's Date: 09/16/2023   History of Present Illness   History of Present Illness: 83 y.o. male with a PMH of CAD, hyperlipidemia, hypertension, type 2 diabetes, SVT, and ESRD on HD (n/w/f).     Per wife, patient has been steadily declining over the past 6 months, but he has significantly worse in the last 2 weeks.  Pt unable to walk, stand up even. He has had few falls at home.  Pt also has been having difficulty with swallowing, he will choke and spit upon po intake. Pt has lost weight. Upon eating food patient will choke, spits it out. He has had problems with liquid intake as well.  Dr. Tanya Nones is the PCP.  He put patient on Protonix, with the thought that if he does not improve they can consult him to GI.  No previous history of dysphagia per wife.  Dr. Haynes Dage also suggest that the patient will likely need admission to the hospital for rehab admission. (per MD)     Clinical Impressions Pt agreeable to OT and PT co-evaluation. Pt has become progressively weaker over the past 2 weeks to the point of minimal ambulation and none over the past 2 days. Today pt required min to mod A for bed mobility and min to mod A for transfer to [facility] with RW. Max A for peri-care and lower body dressing. Noted generally weakness in B UE as well today. Pt left in bed with family and nurse present. Pt will benefit from continued OT in the hospital and recommended venue below to increase strength, balance, and endurance for safe ADL's.        If plan is discharge home, recommend the following:   A lot of help with walking and/or transfers;A lot of help with bathing/dressing/bathroom;Assistance with cooking/housework;Assist for transportation;Help with stairs or ramp for entrance     Functional Status Assessment   Patient has had a recent decline in their functional status and demonstrates the ability to  make significant improvements in function in a reasonable and predictable amount of time.     Equipment Recommendations   None recommended by OT              Precautions/Restrictions   Precautions Precautions: Fall Restrictions Weight Bearing Restrictions Per Provider Order: No     Mobility Bed Mobility Overal bed mobility: Needs Assistance Bed Mobility: Supine to Sit, Sit to Supine     Supine to sit: Min assist, Mod assist, HOB elevated Sit to supine: Min assist, HOB elevated   General bed mobility comments: HOB slightly elevated; labored movement with difficulty pulling to sit with R UE while pushing with L UE to bed. More min A for sit to supine.    Transfers Overall transfer level: Needs assistance Equipment used: Rolling walker (2 wheels) Transfers: Sit to/from Stand, Bed to chair/wheelchair/BSC Sit to Stand: Mod assist, Min assist     Step pivot transfers: Min assist, Mod assist     General transfer comment: Mod A to boost from [facility] per physical therapist. More min from bed. Slow labored movement with RW.      Balance Overall balance assessment: Needs assistance Sitting-balance support: No upper extremity supported, Feet supported Sitting balance-Leahy Scale: Fair Sitting balance - Comments: seated at EOB   Standing balance support: During functional activity, Bilateral upper extremity supported, Reliant on assistive device for balance Standing balance-Leahy Scale: Poor Standing balance comment: using RW  ADL either performed or assessed with clinical judgement   ADL Overall ADL's : Needs assistance/impaired     Grooming: Contact guard assist;Sitting   Upper Body Bathing: Contact guard assist;Set up;Sitting   Lower Body Bathing: Maximal assistance;Sitting/lateral leans   Upper Body Dressing : Contact guard assist;Set up;Sitting   Lower Body Dressing: Maximal assistance;Sitting/lateral leans Lower Body  Dressing Details (indicate cue type and reason): Assisted to don socks and remove pants today. Toilet Transfer: Minimal assistance;Moderate assistance;Rolling walker (2 wheels);Stand-pivot;BSC/3in1 Statistician Details (indicate cue type and reason): EOB to [facility] and back Toileting- Clothing Manipulation and Hygiene: Maximal assistance;Sit to/from stand Toileting - Clothing Manipulation Details (indicate cue type and reason): Max A to pull down pants and complete peri-care today.     Functional mobility during ADLs: Minimal assistance;Moderate assistance;Rolling walker (2 wheels)       Vision Baseline Vision/History: 1 Wears glasses Ability to See in Adequate Light: 1 Impaired Patient Visual Report: No change from baseline Vision Assessment?: No apparent visual deficits (other than baseline issues)     Perception Perception: Not tested       Praxis Praxis: Not tested       Pertinent Vitals/Pain Pain Assessment Pain Assessment: 0-10 Pain Score: 8  Pain Location: back Pain Descriptors / Indicators: Discomfort Pain Intervention(s): Monitored during session, Repositioned     Extremity/Trunk Assessment Upper Extremity Assessment Upper Extremity Assessment: Generalized weakness   Lower Extremity Assessment Lower Extremity Assessment: Defer to PT evaluation   Cervical / Trunk Assessment Cervical / Trunk Assessment: Kyphotic   Communication Communication Communication: No apparent difficulties   Cognition Arousal: Alert Behavior During Therapy: WFL for tasks assessed/performed Cognition: No apparent impairments                               Following commands: Intact       Cueing     Cueing Techniques: Verbal cues                 Home Living Family/patient expects to be discharged to:: Private residence Living Arrangements: Spouse/significant other Available Help at Discharge: Family;Available 24 hours/day Type of Home: House Home Access:  Ramped entrance     Home Layout: One level     Bathroom Shower/Tub: Other (comment)   Bathroom Toilet: Handicapped height Bathroom Accessibility: Yes   Home Equipment: Agricultural consultant (2 wheels);Cane - single point;Grab bars - tub/shower;Shower seat - built in;Lift chair   Additional Comments: Family reports no change in living set up.      Prior Functioning/Environment Prior Level of Function : Needs assist       Physical Assist : ADLs (physical)   ADLs (physical): IADLs Mobility Comments: The past 2 weeks the pt has been less mobile. Pt used rollator for houshold ambulation prior to onset of weakness which resulted in more time in bed. ADLs Comments: Typically assisted for donning socks but reports independence otherwise. Over the past two weeks pt has been weaker and required more assist. Wife completes IADL's.    OT Problem List: Decreased strength;Decreased activity tolerance;Impaired balance (sitting and/or standing);Impaired vision/perception   OT Treatment/Interventions: Self-care/ADL training;Therapeutic exercise;DME and/or AE instruction;Therapeutic activities;Patient/family education;Balance training;Energy conservation      OT Goals(Current goals can be found in the care plan section)   Acute Rehab OT Goals Patient Stated Goal: Improve function at rehab. OT Goal Formulation: With patient/family Time For Goal Achievement: 09/30/23 Potential to Achieve Goals: Good  OT Frequency:  Min 2X/week    Co-evaluation PT/OT/SLP Co-Evaluation/Treatment: Yes Reason for Co-Treatment: To address functional/ADL transfers   OT goals addressed during session: ADL's and self-care      AM-PAC OT "6 Clicks" Daily Activity     Outcome Measure Help from another person eating meals?: A Little (Difficulty swallowing at this time.) Help from another person taking care of personal grooming?: A Little Help from another person toileting, which includes using toliet, bedpan, or  urinal?: A Lot Help from another person bathing (including washing, rinsing, drying)?: A Lot Help from another person to put on and taking off regular upper body clothing?: A Little Help from another person to put on and taking off regular lower body clothing?: A Lot 6 Click Score: 15   End of Session Equipment Utilized During Treatment: Rolling walker (2 wheels)  Activity Tolerance: Patient tolerated treatment well Patient left: in bed;with nursing/sitter in room;with family/visitor present  OT Visit Diagnosis: Unsteadiness on feet (R26.81);Other abnormalities of gait and mobility (R26.89);Muscle weakness (generalized) (M62.81);History of falling (Z91.81);Feeding difficulties (R63.3);Repeated falls (R29.6)                Time: 1449-1510 OT Time Calculation (min): 21 min Charges:  OT General Charges $OT Visit: 1 Visit OT Evaluation $OT Eval Moderate Complexity: 1 Mod  Iness Pangilinan OT, MOT   Danie Chandler 09/16/2023, 3:40 PM

## 2023-09-16 NOTE — Plan of Care (Signed)
  Problem: Acute Rehab PT Goals(only PT should resolve) Goal: Pt Will Go Supine/Side To Sit Outcome: Progressing Flowsheets (Taken 09/16/2023 1552) Pt will go Supine/Side to Sit:  with contact guard assist  with minimal assist Goal: Patient Will Transfer Sit To/From Stand Outcome: Progressing Flowsheets (Taken 09/16/2023 1552) Patient will transfer sit to/from stand:  with contact guard assist  with minimal assist Goal: Pt Will Transfer Bed To Chair/Chair To Bed Outcome: Progressing Flowsheets (Taken 09/16/2023 1552) Pt will Transfer Bed to Chair/Chair to Bed:  with contact guard assist  with min assist Goal: Pt Will Ambulate Outcome: Progressing Flowsheets (Taken 09/16/2023 1552) Pt will Ambulate:  10 feet  with contact guard assist  with minimal assist  with rolling walker    Andrian Sabala SPT

## 2023-09-16 NOTE — TOC Initial Note (Signed)
Transition of Care (TOC) - Initial/Assessment Note    Patient Details  Name: Mark PLOUFF Sr. MRN: [id number] Date of Birth: [birth year]  Transition of Care [facility]) CM/SW Contact:    Villa Herb, LCSWA Phone Number: 09/16/2023, 3:34 PM  Clinical Narrative:                 CSW updated that PT is recommending SNF for pt at D/C. CSW spoke with pts spouse who states they are agreeable to SNF referral being sent out. They prefer SNF at [facility] but also pt has been to [facility] and they would be agreeable to this as well. TOC to follow.   Expected Discharge Plan: Skilled Nursing Facility Barriers to Discharge: Continued Medical Work up   Patient Goals and CMS Choice Patient states their goals for this hospitalization and ongoing recovery are:: return home CMS Medicare.gov Compare Post Acute Care list provided to:: Patient Choice offered to / list presented to : Patient, Spouse [place] ownership interest in [facility].provided to:: Spouse    Expected Discharge Plan and Services In-house Referral: Clinical Social Work Discharge Planning Services: CM Consult Post Acute Care Choice: Skilled Nursing Facility Living arrangements for the past 2 months: Single Family Home                                      Prior Living Arrangements/Services Living arrangements for the past 2 months: Single Family Home Lives with:: Spouse Patient language and need for interpreter reviewed:: Yes Do you feel safe going back to the place where you live?: Yes      Need for Family Participation in Patient Care: Yes (Comment) Care giver support system in place?: Yes (comment) Current home services: DME Criminal Activity/Legal Involvement Pertinent to Current Situation/Hospitalization: No - Comment as needed  Activities of Daily Living      Permission Sought/Granted                  Emotional Assessment Appearance:: Appears stated age Attitude/Demeanor/Rapport:  Engaged Affect (typically observed): Accepting Orientation: : Oriented to Self, Oriented to Place, Oriented to Situation, Oriented to  Time Alcohol / Substance Use: Not Applicable Psych Involvement: No (comment)  Admission diagnosis:  Dysphagia [R13.10] Patient Active Problem List   Diagnosis Date Noted   Dysphagia 09/16/2023   Hemodialysis patient (HCC) 08/07/2023   Oliguria 08/07/2023   Kidney stones 08/07/2023   Ureteral calculi 05/29/2023   Sepsis due to Escherichia coli (e. coli) (HCC) 05/28/2023   Urinary tract infection, site not specified 05/28/2023   Dysuria 04/17/2023   Anemia 03/12/2023   Pain due to onychomycosis of toenails of both feet 01/07/2023   Encounter for long-term opiate analgesic use 11/15/2021   Pain management contract signed 09/18/2021   Calciphylaxis of left lower extremity with nonhealing ulcer with necrosis of muscle (HCC) 09/18/2021   Chronic pain syndrome 09/18/2021   Hypocalcemia 06/19/2021   Nausea 05/19/2021   Metabolic encephalopathy 02/02/2021   End stage renal disease (HCC) 02/02/2021   Hypoglycemia 02/02/2021   E. Faecalis and Staph Aureus UTI 02/02/2021   Vascular dialysis catheter in place [facility])    Shingles 01/14/2021   Pressure injury of skin 01/09/2021   SVT (supraventricular tachycardia) (HCC) 01/07/2021   Acute renal failure superimposed on stage 3b chronic kidney disease (HCC) 01/03/2021   Acute metabolic encephalopathy 01/03/2021   Hyperkalemia 12/02/2018   Altered mental state  12/02/2018   Pancreatic insufficiency 12/02/2018   Wears hearing aid 12/02/2018   HOH (hard of hearing) 12/02/2018   DM type 2 with diabetic peripheral neuropathy (HCC) 09/15/2018   Uncontrolled type 2 diabetes mellitus with hyperglycemia (HCC) 09/15/2018   Peripheral edema 06/25/2018   NAFLD (nonalcoholic fatty liver disease)    Diabetic retinopathy (HCC)    ED (erectile dysfunction) 01/05/2014   Shortness of breath 12/05/2011   Coronary  atherosclerosis of native coronary artery 12/05/2011   Type 2 diabetes mellitus with hyperlipidemia (HCC) 12/05/2011   Essential hypertension, benign 12/05/2011   Mixed hyperlipidemia 12/05/2011   Malignant neoplasm of prostate (HCC) 06/26/2011   PCP:  Donita Brooks, MD Pharmacy:   [facility] [phone] - [place], [place] - [address] AT [facility] [place] [address] [place] [address] Phone: [phone] Fax: [phone]     Social Drivers of Health (SDOH) Social History: SDOH Screenings   Food Insecurity: No Food Insecurity (06/05/2023)  Housing: Low Risk  (06/05/2023)  Transportation Needs: No Transportation Needs (06/05/2023)  Utilities: Not At Risk (06/05/2023)  Alcohol Screen: Low Risk  (08/22/2022)  Depression (PHQ2-9): High Risk (08/21/2023)  Financial Resource Strain: Low Risk  (08/22/2022)  Physical Activity: Inactive (08/22/2022)  Social Connections: Moderately Isolated (08/22/2022)  Stress: No Stress Concern Present (08/22/2022)  Tobacco Use: Low Risk  (09/16/2023)   SDOH Interventions:     Readmission Risk Interventions    05/29/2023    2:05 PM 04/18/2021   12:16 PM 04/16/2021    3:07 PM  Readmission Risk Prevention Plan  Transportation Screening Complete  Complete  HRI or Home Care Consult Complete    Social Work Consult for Recovery Care Planning/Counseling Complete    Palliative Care Screening Not Applicable    Medication Review Oceanographer) Complete  Complete  HRI or Home Care Consult   Complete  SW Recovery Care/Counseling Consult   Complete  Palliative Care Screening   Not Applicable  Skilled Nursing Facility  Not Applicable Not Complete

## 2023-09-17 ENCOUNTER — Observation Stay (HOSPITAL_COMMUNITY): Payer: PPO | Admitting: Anesthesiology

## 2023-09-17 ENCOUNTER — Encounter (HOSPITAL_COMMUNITY): Payer: Self-pay | Admitting: Family Medicine

## 2023-09-17 ENCOUNTER — Encounter (HOSPITAL_COMMUNITY)
Admission: EM | Disposition: A | Discharge status: Skilled Nursing Facility | Payer: Self-pay | Source: Home / Self Care | Attending: Internal Medicine

## 2023-09-17 DIAGNOSIS — Z992 Dependence on renal dialysis: Secondary | ICD-10-CM | POA: Diagnosis not present

## 2023-09-17 DIAGNOSIS — E785 Hyperlipidemia, unspecified: Secondary | ICD-10-CM | POA: Diagnosis not present

## 2023-09-17 DIAGNOSIS — I120 Hypertensive chronic kidney disease with stage 5 chronic kidney disease or end stage renal disease: Secondary | ICD-10-CM | POA: Diagnosis not present

## 2023-09-17 DIAGNOSIS — E1169 Type 2 diabetes mellitus with other specified complication: Secondary | ICD-10-CM

## 2023-09-17 DIAGNOSIS — K222 Esophageal obstruction: Secondary | ICD-10-CM

## 2023-09-17 DIAGNOSIS — K317 Polyp of stomach and duodenum: Secondary | ICD-10-CM

## 2023-09-17 DIAGNOSIS — Z7189 Other specified counseling: Secondary | ICD-10-CM | POA: Diagnosis not present

## 2023-09-17 DIAGNOSIS — B3781 Candidal esophagitis: Secondary | ICD-10-CM

## 2023-09-17 DIAGNOSIS — N186 End stage renal disease: Secondary | ICD-10-CM

## 2023-09-17 DIAGNOSIS — K3189 Other diseases of stomach and duodenum: Secondary | ICD-10-CM

## 2023-09-17 DIAGNOSIS — I10 Essential (primary) hypertension: Secondary | ICD-10-CM | POA: Diagnosis not present

## 2023-09-17 DIAGNOSIS — R1319 Other dysphagia: Secondary | ICD-10-CM | POA: Diagnosis not present

## 2023-09-17 DIAGNOSIS — I2510 Atherosclerotic heart disease of native coronary artery without angina pectoris: Secondary | ICD-10-CM | POA: Diagnosis not present

## 2023-09-17 DIAGNOSIS — D631 Anemia in chronic kidney disease: Secondary | ICD-10-CM | POA: Diagnosis not present

## 2023-09-17 DIAGNOSIS — K2950 Unspecified chronic gastritis without bleeding: Secondary | ICD-10-CM | POA: Diagnosis not present

## 2023-09-17 DIAGNOSIS — R531 Weakness: Secondary | ICD-10-CM | POA: Diagnosis not present

## 2023-09-17 DIAGNOSIS — E1142 Type 2 diabetes mellitus with diabetic polyneuropathy: Secondary | ICD-10-CM

## 2023-09-17 DIAGNOSIS — Z515 Encounter for palliative care: Secondary | ICD-10-CM | POA: Diagnosis not present

## 2023-09-17 DIAGNOSIS — E1165 Type 2 diabetes mellitus with hyperglycemia: Secondary | ICD-10-CM | POA: Diagnosis not present

## 2023-09-17 DIAGNOSIS — R627 Adult failure to thrive: Secondary | ICD-10-CM | POA: Diagnosis not present

## 2023-09-17 DIAGNOSIS — N2581 Secondary hyperparathyroidism of renal origin: Secondary | ICD-10-CM | POA: Diagnosis not present

## 2023-09-17 DIAGNOSIS — R1310 Dysphagia, unspecified: Secondary | ICD-10-CM | POA: Diagnosis not present

## 2023-09-17 HISTORY — PX: MALONEY DILATION: SHX5535

## 2023-09-17 HISTORY — PX: BIOPSY: SHX5522

## 2023-09-17 HISTORY — PX: ESOPHAGEAL BRUSHING: SHX6842

## 2023-09-17 HISTORY — PX: ESOPHAGOGASTRODUODENOSCOPY (EGD) WITH PROPOFOL: SHX5813

## 2023-09-17 LAB — CBC
HCT: 22.9 % — ABNORMAL LOW (ref 39.0–52.0)
Hemoglobin: 7.2 g/dL — ABNORMAL LOW (ref 13.0–17.0)
MCH: 33.3 pg (ref 26.0–34.0)
MCHC: 31.4 g/dL (ref 30.0–36.0)
MCV: 106 fL — ABNORMAL HIGH (ref 80.0–100.0)
Platelets: 107 10*3/uL — ABNORMAL LOW (ref 150–400)
RBC: 2.16 MIL/uL — ABNORMAL LOW (ref 4.22–5.81)
RDW: 14.7 % (ref 11.5–15.5)
WBC: 9.7 10*3/uL (ref 4.0–10.5)
nRBC: 0 % (ref 0.0–0.2)

## 2023-09-17 LAB — BASIC METABOLIC PANEL
Anion gap: 12 (ref 5–15)
BUN: 52 mg/dL — ABNORMAL HIGH (ref 8–23)
CO2: 24 mmol/L (ref 22–32)
Calcium: 7 mg/dL — ABNORMAL LOW (ref 8.9–10.3)
Chloride: 97 mmol/L — ABNORMAL LOW (ref 98–111)
Creatinine, Ser: 2.77 mg/dL — ABNORMAL HIGH (ref 0.61–1.24)
GFR, Estimated: 22 mL/min — ABNORMAL LOW (ref >60)
Glucose, Bld: 82 mg/dL (ref 70–99)
Potassium: 4.7 mmol/L (ref 3.5–5.1)
Sodium: 133 mmol/L — ABNORMAL LOW (ref 135–145)

## 2023-09-17 LAB — GLUCOSE, CAPILLARY
Glucose-Capillary: 80 mg/dL (ref 70–99)
Glucose-Capillary: 71 mg/dL (ref 70–99)
Glucose-Capillary: 48 mg/dL — ABNORMAL LOW (ref 70–99)
Glucose-Capillary: 80 mg/dL (ref 70–99)
Glucose-Capillary: 140 mg/dL — ABNORMAL HIGH (ref 70–99)
Glucose-Capillary: 96 mg/dL (ref 70–99)
Glucose-Capillary: 68 mg/dL — ABNORMAL LOW (ref 70–99)

## 2023-09-17 LAB — FERRITIN: Ferritin: 1298 ng/mL — ABNORMAL HIGH (ref 24–336)

## 2023-09-17 LAB — KOH PREP

## 2023-09-17 LAB — HEPATITIS B SURFACE ANTIBODY, QUANTITATIVE: Hep B S AB Quant (Post): 88.4 m[IU]/mL

## 2023-09-17 LAB — IRON AND TIBC: Iron: 60 ug/dL (ref 45–182)

## 2023-09-17 SURGERY — ESOPHAGOGASTRODUODENOSCOPY (EGD) WITH PROPOFOL
Anesthesia: General

## 2023-09-17 MED ORDER — PHENYLEPHRINE 80 MCG/ML (10ML) SYRINGE FOR IV PUSH (FOR BLOOD PRESSURE SUPPORT)
PREFILLED_SYRINGE | INTRAVENOUS | Status: DC | PRN
  Administered 2023-09-17 (×7): 160 ug via INTRAVENOUS

## 2023-09-17 MED ORDER — PENTAFLUOROPROP-TETRAFLUOROETH EX AERO: 1.0000 | INHALATION_SPRAY | CUTANEOUS | Status: DC | PRN

## 2023-09-17 MED ORDER — PROPOFOL 10 MG/ML IV BOLUS
INTRAVENOUS | Status: DC | PRN
Start: 2023-09-17 — End: 2023-09-17
  Administered 2023-09-17: 40 mg via INTRAVENOUS

## 2023-09-17 MED ORDER — SODIUM CHLORIDE FLUSH 0.9 % IV SOLN
INTRAVENOUS | Status: AC
  Filled 2023-09-17: qty 10

## 2023-09-17 MED ORDER — LACTATED RINGERS IV SOLN: INTRAVENOUS | Status: DC

## 2023-09-17 MED ORDER — DEXTROSE 50 % IV SOLN
INTRAVENOUS | Status: AC
  Filled 2023-09-17: qty 50

## 2023-09-17 MED ORDER — SODIUM CHLORIDE 0.9 % IV SOLN: INTRAVENOUS | Status: DC | PRN

## 2023-09-17 MED ORDER — DEXTROSE 50 % IV SOLN: 12.5000 g | Freq: Once | INTRAVENOUS | Status: DC

## 2023-09-17 MED ORDER — SODIUM CHLORIDE FLUSH 0.9 % IV SOLN
INTRAVENOUS | Status: AC
  Filled 2023-09-17: qty 20

## 2023-09-17 MED ORDER — VASOPRESSIN 20 UNIT/ML IV SOLN
INTRAVENOUS | Status: DC | PRN
  Administered 2023-09-17 (×2): 1 [IU] via INTRAVENOUS

## 2023-09-17 MED ORDER — METOCLOPRAMIDE HCL 5 MG/ML IJ SOLN: 10.0000 mg | Freq: Once | INTRAMUSCULAR | Status: DC | PRN

## 2023-09-17 MED ORDER — LIDOCAINE HCL (CARDIAC) PF 100 MG/5ML IV SOSY
PREFILLED_SYRINGE | INTRAVENOUS | Status: DC | PRN
  Administered 2023-09-17: 60 mg via INTRATRACHEAL

## 2023-09-17 MED ORDER — PHENYLEPHRINE 80 MCG/ML (10ML) SYRINGE FOR IV PUSH (FOR BLOOD PRESSURE SUPPORT)
PREFILLED_SYRINGE | INTRAVENOUS | Status: AC
  Filled 2023-09-17: qty 20

## 2023-09-17 MED ORDER — FENTANYL CITRATE (PF) 100 MCG/2ML IJ SOLN: 25.0000 ug | INTRAMUSCULAR | Status: DC | PRN

## 2023-09-17 MED ORDER — DEXTROSE 50 % IV SOLN
25.0000 g | INTRAVENOUS | Status: AC
  Administered 2023-09-17: 25 g via INTRAVENOUS

## 2023-09-17 MED ORDER — ALBUMIN HUMAN 25 % IV SOLN
INTRAVENOUS | Status: AC
  Filled 2023-09-17: qty 50

## 2023-09-17 MED ORDER — ALBUMIN HUMAN 25 % IV SOLN
25.0000 g | Freq: Once | INTRAVENOUS | Status: AC
  Administered 2023-09-17: 25 g via INTRAVENOUS

## 2023-09-17 MED ORDER — DEXTROSE 50 % IV SOLN
INTRAVENOUS | Status: AC
  Administered 2023-09-17: 25 mL
  Filled 2023-09-17: qty 50

## 2023-09-17 MED ORDER — SODIUM CHLORIDE 0.9 % IV SOLN: INTRAVENOUS | Status: DC

## 2023-09-17 MED ORDER — PROPOFOL 500 MG/50ML IV EMUL
INTRAVENOUS | Status: DC | PRN
  Administered 2023-09-17: 100 ug/kg/min via INTRAVENOUS

## 2023-09-17 NOTE — Anesthesia Preprocedure Evaluation (Signed)
Anesthesia Evaluation  Patient identified by MRN, date of birth, ID band Patient awake    Reviewed: Allergy & Precautions, H&P , NPO status , Patient's Chart, lab work & pertinent test results  Airway Mallampati: III  TM Distance: >3 FB Neck ROM: Full    Dental no notable dental hx.    Pulmonary neg pulmonary ROS, shortness of breath   breath sounds clear to auscultation + decreased breath sounds      Cardiovascular hypertension, + CAD  negative cardio ROS Normal cardiovascular exam+ Valvular Problems/Murmurs  Rhythm:Regular Rate:Normal     Neuro/Psych  Neuromuscular disease negative neurological ROS  negative psych ROS   GI/Hepatic negative GI ROS, Neg liver ROS,,,  Endo/Other  negative endocrine ROSdiabetes    Renal/GU DialysisRenal diseasenegative Renal ROS  negative genitourinary   Musculoskeletal negative musculoskeletal ROS (+) Arthritis ,    Abdominal   Peds negative pediatric ROS (+)  Hematology negative hematology ROS (+) Blood dyscrasia, anemia   Anesthesia Other Findings   Reproductive/Obstetrics negative OB ROS                             Anesthesia Physical Anesthesia Plan  ASA: 3  Anesthesia Plan: General   Post-op Pain Management: Minimal or no pain anticipated   Induction: Intravenous  PONV Risk Score and Plan: 1 and Propofol infusion  Airway Management Planned: Simple Face Mask and Nasal Cannula  Additional Equipment:   Intra-op Plan:   Post-operative Plan:   Informed Consent: I have reviewed the patients History and Physical, chart, labs and discussed the procedure including the risks, benefits and alternatives for the proposed anesthesia with the patient or authorized representative who has indicated his/her understanding and acceptance.       Plan Discussed with: CRNA  Anesthesia Plan Comments:        Anesthesia Quick Evaluation

## 2023-09-17 NOTE — Anesthesia Postprocedure Evaluation (Signed)
Anesthesia Post Note  Patient: Mark Art Sr.  Procedure(s) Performed: ESOPHAGOGASTRODUODENOSCOPY (EGD) WITH PROPOFOL ESOPHAGEAL BRUSHING BIOPSY MALONEY DILATION  Patient location during evaluation: PACU Anesthesia Type: General Level of consciousness: awake and alert Pain management: pain level controlled Vital Signs Assessment: post-procedure vital signs reviewed and stable Respiratory status: spontaneous breathing, nonlabored ventilation and respiratory function stable Cardiovascular status: blood pressure returned to baseline and stable Postop Assessment: no apparent nausea or vomiting Anesthetic complications: no   No notable events documented.   Last Vitals:  Vitals:   09/17/23 1345 09/17/23 1438  BP: 107/63 (!) 109/32  Pulse: 95 100  Resp: 14 16  Temp:  36.9 C  SpO2: 90% 93%    Last Pain:  Vitals:   09/17/23 1438  TempSrc: Oral  PainSc:                  Roslynn Amble

## 2023-09-17 NOTE — Inpatient Diabetes Management (Signed)
Inpatient Diabetes Program Recommendations  AACE/ADA: New Consensus Statement on Inpatient Glycemic Control   Target Ranges:  Prepandial:   less than 140 mg/dL      Peak postprandial:   less than 180 mg/dL (1-2 hours)      Critically ill patients:  140 - 180 mg/dL    Latest Reference Range & Units 09/17/23 09:44 09/17/23 10:49 09/17/23 12:39 09/17/23 13:28 09/17/23 13:45  Glucose-Capillary 70 - 99 mg/dL 80 71 80 48 (L) 161 (H)   Review of Glycemic Control  Diabetes history: DM2 Outpatient Diabetes medications: Lantus 12 units at bedtime, Novolog 2 units Monday, Wednesday, and Friday Current orders for Inpatient glycemic control: Semglee 6 units at bedtime, Novolog 0-9 units TID with meals, Novolog 0-5 units QHS  Inpatient Diabetes Program Recommendations:    Insulin: No Semglee given on 09/16/23 and glucose down to 48 mg/dl at 09:60 today. May want to consider discontinuing Semglee for now.  Thanks, Orlando Penner, RN, MSN, CDCES Diabetes Coordinator Inpatient Diabetes Program [phone] (Team Pager from 8am to 5pm)

## 2023-09-17 NOTE — Op Note (Addendum)
[facility] Patient Name: [name] Procedure Date: 09/17/2023 10:17 AM MRN: [id number] Date of Birth: 1941-01-30 Attending MD: Gennette Pac , MD, [id number] CSN: [id number] Age: 83 Admit Type: Outpatient Procedure:                Upper GI endoscopy Indications:              Dysphagia, weight loss Providers:                Gennette Pac, MD, Crystal Page, Dyann Ruddle Referring MD:              Medicines:                Propofol per Anesthesia Complications:            No immediate complications. Estimated Blood Loss:     Estimated blood loss was minimal. Procedure:                Pre-Anesthesia Assessment:                           - Prior to the procedure, a History and Physical                            was performed, and patient medications and                            allergies were reviewed. The patient's tolerance of                            previous anesthesia was also reviewed. The risks                            and benefits of the procedure and the sedation                            options and risks were discussed with the patient.                            All questions were answered, and informed consent                            was obtained. Prior Anticoagulants: The patient has                            taken no anticoagulant or antiplatelet agents. ASA                            Grade Assessment: III - A patient with severe                            systemic disease. After reviewing the risks and                            benefits, the patient was deemed in satisfactory  condition to undergo the procedure.                           After obtaining informed consent, the endoscope was                            passed under direct vision. Throughout the                            procedure, the patient's blood pressure, pulse, and                            oxygen saturations were monitored continuously.  The                            GIF-[id number] ([id number]) scope was introduced through the                            mouth, and advanced to the second part of duodenum.                            The upper GI endoscopy was accomplished without                            difficulty. The patient tolerated the procedure                            well. Scope In: 12:51:31 PM Scope Out: 1:13:09 PM Total Procedure Duration: 0 hours 21 minutes 38 seconds  Findings:      No Zenker's diverticulum identified. Cream-colored plaques coating the       upper esophagus. Possibly some food debris in the hypopharynx extending       into the proximal esophagus. The tubular esophagus throughout its course       appeared somewhat narrowed. Mild resistance coming and going with the       diagnostic adult gastroscope. Mildly ringed appearance of the tubular       esophagus with Schatzki's ring at the GE junction. Did not see a tumor.      Gastric cavity empty. Diffuse patchy erythema. Angry appearing gastric       mucosa multiple 5 to 6 mm benign-appearing gastric polyps scattered       throughout the body. Slightly deformed but otherwise normal-appearing       pyloric channel.      Examination of bulb and second portion somewhat challenging as there was       looping in the stomach unable to freely deeply intubate the second       portion of the duodenum; there was a 1 cm submucosal D2 nodule present.       The scope was withdrawn. Dilation was performed with a Maloney dilator       with moderate resistance at 48 Fr. The dilation site was examined       following endoscope reinsertion and showed mild mucosal disruption.       Estimated blood loss was minimal.      Subsequently, the esophageal mucosa was brushed for KOH prep. Next, the  proximal and distal esophagus was biopsied for histologic study.       Finally, the gastric mucosa was biopsied for H. pylori testing. One of       the gastric polyps was  removed with cold biopsy forceps. Impression:               - Plaques in the proximal esophagus and hypopharynx                            brushed and biopsied after esophageal dilation                           - Somewhat diffusely narrowed tubular esophagus as                            described. Subtle ringed appearance                           -mild Schatzki ring. Dilated.                           --Proximal and distal esophageal biopsies. Gastric                            biopsy. Gastric polyp removed with cold biopsy                            forceps                           - No Zenker's identified. D2 nodule of uncertain                            significance. Moderate Sedation:      Moderate (conscious) sedation was personally administered by an       anesthesia professional. The following parameters were monitored: oxygen       saturation, heart rate, blood pressure, respiratory rate, EKG, adequacy       of pulmonary ventilation, and response to care. Recommendation:           - Return patient to hospital ward for ongoing care.                           -Clear liquid diet. Follow-up on pending sample                            analysis. Further recommendations to follow. At                            patient request, I called Wentworth Edelen at                            [phone] -reviewed findings and                            recommendations. Her questions were answered.Marland Kitchen  Consider further evaluation of duodenal nodule at a                            later date. Procedure Code(s):        --- Professional ---                           [phone], Esophagogastroduodenoscopy, flexible,                            transoral; diagnostic, including collection of                            specimen(s) by brushing or washing, when performed                            (separate procedure)                           43450, Dilation of esophagus, by  unguided sound or                            bougie, single or multiple passes Diagnosis Code(s):        --- Professional ---                           K22.2, Esophageal obstruction                           R13.10, Dysphagia, unspecified CPT copyright 2022 American Medical Association. All rights reserved. The codes documented in this report are preliminary and upon coder review may  be revised to meet current compliance requirements. Gerrit Friends. Jonisha Kindig, MD Gennette Pac, MD 09/17/2023 1:33:10 PM This report has been signed electronically. Number of Addenda: 0

## 2023-09-17 NOTE — Progress Notes (Signed)
Hypoglycemic Event  CBG: 68  Treatment: 4 oz juice/soda  Symptoms: None  Follow-up CBG: Time:*** CBG Result:***  Possible Reasons for Event: Unknown     [name]

## 2023-09-17 NOTE — Progress Notes (Signed)
Palliative:    Attempt to see [name] but he is off the floor in EGD.  PMT will return. Another attempt to see [name] but he is off the floor.  Conference with attending, bedside nursing staff, transition of care team related to patient condition, needs, goals of care, disposition.    Plan: At this point full scope/full code.  EGD 2/12.  Time for outcomes.  Being worked up for short-term rehab.  Would benefit from outpatient palliative services, not discussed at this point.  25 minutes  Lillia Carmel, NP Palliative medicine team Team phone [phone]

## 2023-09-17 NOTE — Progress Notes (Signed)
Holding AM meds because patient is in dailysis.

## 2023-09-17 NOTE — Transfer of Care (Addendum)
Immediate Anesthesia Transfer of Care Note  Patient: Mark Art Sr.  Procedure(s) Performed: ESOPHAGOGASTRODUODENOSCOPY (EGD) WITH PROPOFOL ESOPHAGEAL BRUSHING BIOPSY MALONEY DILATION  Patient Location: PACU  Anesthesia Type:General  Level of Consciousness: drowsy and patient cooperative  Airway & Oxygen Therapy: Patient Spontanous Breathing  Post-op Assessment: Report given to RN and Post -op Vital signs reviewed and stable  Post vital signs: Reviewed and stable  Last Vitals:  Vitals Value Taken Time  BP 130/38 09/17/23 1321  Temp 36.4 C 09/17/23 1321  Pulse 90 09/17/23   1321  Resp 19 09/17/23 1325  SpO2 97% 09/17/23   1321  Vitals shown include unfiled device data.  Last Pain:  Vitals:   09/17/23 1244  TempSrc:   PainSc: 7          Complications: No notable events documented.

## 2023-09-17 NOTE — Plan of Care (Signed)

## 2023-09-17 NOTE — Care Management Obs Status (Signed)
MEDICARE OBSERVATION STATUS NOTIFICATION   Patient Details  Name: Mark GASCOIGNE Sr. MRN: [id number] Date of Birth: July 01, 1941   Medicare Observation Status Notification Given:  Yes    Corey Harold 09/17/2023, 2:57 PM

## 2023-09-17 NOTE — Progress Notes (Signed)
Alert, very HOH.  BP's have been soft.  Ted hose applied  Difficult to get O2 sat unless using hot pack to warm fingers and then saturation in high 90's on room air.   Knows to be npo until EGD at around 1400.  Consent signed and on chart.

## 2023-09-17 NOTE — Evaluation (Signed)
Clinical/Bedside Swallow Evaluation Patient Details  Name: Mark CUCCARO Sr. MRN: [id number] Date of Birth: [birth year]  Today's Date: 09/17/2023 Time: SLP Start Time (ACUTE ONLY): 1430 SLP Stop Time (ACUTE ONLY): 1502 SLP Time Calculation (min) (ACUTE ONLY): 32 min  Past Medical History:  Past Medical History:  Diagnosis Date   Arthritis    lower back   Chronic back pain    Disc disease   Chronic kidney disease    Coronary atherosclerosis of native coronary artery    Coronary calcifications by chest CT, Myoview demonstrating inferolateral scar   Deafness in right ear    Diabetes mellitus    Diabetic retinopathy (HCC)    moderate nonproliferative with macular edema in right eye   Diastolic dysfunction 12/2011   Grade 1.   Essential hypertension, benign    Heart murmur    in past   HOH (hard of hearing)    Hyperkalemia    Kidney stones    Mixed hyperlipidemia    NAFLD (nonalcoholic fatty liver disease)    Pancreatic insufficiency    Diagnosed at [facility]   Prostate cancer [facility]) 2011   Shortness of breath dyspnea    occasional - liver pressing on right lung, decreased capacity   Subdural hematoma (HCC)    Type 2 diabetes mellitus (HCC)    Wears hearing aid    left   Past Surgical History:  Past Surgical History:  Procedure Laterality Date   A/V FISTULAGRAM Left 07/02/2023   Procedure: A/V Fistulagram;  Surgeon: Daria Pastures, MD;  Location: [facility] INVASIVE CV LAB;  Service: Vascular;  Laterality: Left;   APPENDECTOMY     AV FISTULA PLACEMENT Left 05/15/2021   Procedure: INSERTION OF LEFT ARM ARTERIOVENOUS (AV) GORE-TEX GRAFT;  Surgeon: Larina Earthly, MD;  Location: [facility] ORS;  Service: Vascular;  Laterality: Left;   Bilateral hip replacement     CATARACT EXTRACTION W/PHACO Left 05/15/2015   Procedure: CATARACT EXTRACTION PHACO AND INTRAOCULAR LENS PLACEMENT (IOC);  Surgeon: Sherald Hess, MD;  Location: [facility] SURGERY CNTR;  Service: Ophthalmology;  Laterality:  Left;  DIABETIC - insulin pump and oral meds   CATARACT EXTRACTION W/PHACO Right 08/28/2015   Procedure: CATARACT EXTRACTION PHACO AND INTRAOCULAR LENS PLACEMENT (IOC);  Surgeon: Sherald Hess, MD;  Location: [facility] SURGERY CNTR;  Service: Ophthalmology;  Laterality: Right;  DIABETIC PER PT AND CINDY PLEASE KEEP ARRIVAL TIME AFTER 8AM    CHOLECYSTECTOMY     CYSTOSCOPY W/ URETERAL STENT PLACEMENT Right 05/29/2023   Procedure: CYSTOSCOPY WITH RETROGRADE PYELOGRAM/URETERAL STENT PLACEMENT;  Surgeon: Malen Gauze, MD;  Location: [facility];  Service: Urology;  Laterality: Right;   CYSTOSCOPY WITH RETROGRADE PYELOGRAM, URETEROSCOPY AND STENT PLACEMENT Right 07/24/2023   Procedure: CYSTOSCOPY WITH RETROGRADE PYELOGRAM, URETEROSCOPY AND STENT PLACEMENT;  Surgeon: Malen Gauze, MD;  Location: [facility];  Service: Urology;  Laterality: Right;  dialysis pt   HERNIA REPAIR     INSERTION OF DIALYSIS CATHETER Right 01/22/2021   Procedure: INSERTION OF DIALYSIS CATHETER;  Surgeon: Lucretia Roers, MD;  Location: [facility];  Service: General;  Laterality: Right;   INSERTION OF DIALYSIS CATHETER Right 04/16/2021   Procedure: INSERTION OF DIALYSIS CATHETER;  Surgeon: Lucretia Roers, MD;  Location: [facility];  Service: General;  Laterality: Right;   PERIPHERAL VASCULAR BALLOON ANGIOPLASTY  07/02/2023   Procedure: PERIPHERAL VASCULAR BALLOON ANGIOPLASTY;  Surgeon: Daria Pastures, MD;  Location: [facility] INVASIVE CV LAB;  Service: Vascular;;  lt arm fistula  PERIPHERAL VASCULAR BALLOON ANGIOPLASTY  09/01/2023   Procedure: PERIPHERAL VASCULAR BALLOON ANGIOPLASTY;  Surgeon: Ethelene Hal, MD;  Location: [facility] INVASIVE CV LAB;  Service: Cardiovascular;;  50% Venous Anastomosis; 70% Intragraft   PERIPHERAL VASCULAR THROMBECTOMY N/A 09/01/2023   Procedure: PERIPHERAL VASCULAR THROMBECTOMY;  Surgeon: Ethelene Hal, MD;  Location: [facility] INVASIVE CV LAB;  Service: Cardiovascular;  Laterality: N/A;   PROSTATECTOMY  2011    STONE EXTRACTION WITH BASKET Right 07/24/2023   Procedure: STONE EXTRACTION WITH BASKET;  Surgeon: Malen Gauze, MD;  Location: [facility];  Service: Urology;  Laterality: Right;   HPI:  Mark EPPINGER Sr. is a 83 y.o. male with medical history significant for  ESRD on HD MWF, HTN, HLD, DM II, nephrolithiasis , hearing loss, history of palpitations/SVT presents to ED on 09/16/23--by [facility] from home with c/o weakness and difficulty swallowing for the past couple of days. He has felt like he has been choking on his food and liquid. BSE requested    Assessment / Plan / Recommendation  Clinical Impression  Clinical swallowing evaluation completed while Pt was sitting upright in bed; Pt was lethargic but easily roused to appropriate alertness for PO trials. Pt is on clear liquids following EGD + dilation earlier today. Per Pt's wife Pt has been coughing and unable to keep food down for 2-3 weeks. She also reports "choking" with the initial swallow. SLP provided limited trials of water via tsp with immediate wet coughing and wet vocal quality followed by extended wet throat clearing. Pt took two cup sips of water and the first did not result in immediate coughing but the second trial resulted in immediate wet coughing and prolonged throat clearing. After 2-3 mins Pt demonstrated regurgitation into a cup containing water and phlegm. Overt s/sx including immediate coughing and wet vocal quality indicate oropharyngeal dysphagia. Anticipate anything Pt swallows will be regurgitated based on very limited trials provided. Pt is at increased risk for aspiration given oropharyngeal signs observed, esophageal issues and frequent regurgitation. Recommend continue with clear liquid diet recommended by GI - ST will follow tomorrow for a more in depth bedside assessment and will follow with objective assessment if indicated. Above to RN and MD, Thank you.  SLP Visit Diagnosis: Dysphagia, oropharyngeal phase  (R13.12);Dysphagia, unspecified (R13.10)    Aspiration Risk  Moderate aspiration risk    Diet Recommendation Thin liquid    Liquid Administration via: Cup;Straw Medication Administration: Whole meds with puree Supervision: Patient able to self feed;Intermittent supervision to cue for compensatory strategies Compensations: Minimize environmental distractions;Slow rate;Small sips/bites Postural Changes: Seated upright at 90 degrees    Other  Recommendations Oral Care Recommendations: Oral care BID    Recommendations for follow up therapy are one component of a multi-disciplinary discharge planning process, led by the attending physician.  Recommendations may be updated based on patient status, additional functional criteria and insurance authorization.           Frequency and Duration min 2x/week  1 week       Prognosis Prognosis for improved oropharyngeal function: Fair      Swallow Study   General Date of Onset: 09/16/23 HPI: JANCE SIEK Sr. is a 83 y.o. male with medical history significant for  ESRD on HD MWF, HTN, HLD, DM II, nephrolithiasis , hearing loss, history of palpitations/SVT presents to ED on 09/16/23--by [facility] from home with c/o weakness and difficulty swallowing for the past couple of days. He has felt like he has been choking  on his food and liquid. BSE requested Type of Study: Bedside Swallow Evaluation Previous Swallow Assessment: none in chart Diet Prior to this Study: Clear liquid diet Temperature Spikes Noted: No Respiratory Status: Room air History of Recent Intubation: No Behavior/Cognition: Cooperative;Pleasant mood;Lethargic/Drowsy Oral Cavity Assessment: Within Functional Limits;Dry Oral Care Completed by SLP: Recent completion by staff Oral Cavity - Dentition: Adequate natural dentition Vision: Functional for self-feeding Self-Feeding Abilities: Able to feed self Patient Positioning: Upright in bed Baseline Vocal Quality: Normal Volitional  Cough: Wet Volitional Swallow: Able to elicit    Oral/Motor/Sensory Function Overall Oral Motor/Sensory Function: Within functional limits   Ice Chips Ice chips: Impaired Pharyngeal Phase Impairments: Multiple swallows;Wet Vocal Quality;Cough - Immediate;Throat Clearing - Immediate;Throat Clearing - Delayed   Thin Liquid Thin Liquid: Impaired Presentation: Cup Pharyngeal  Phase Impairments: Cough - Immediate;Cough - Delayed;Throat Clearing - Delayed;Wet Vocal Quality;Multiple swallows    Nectar Thick Nectar Thick Liquid: Not tested   Honey Thick Honey Thick Liquid: Not tested   Puree Puree: Not tested   Solid     Solid: Not tested     Sylis Ketchum H. Romie Levee, CCC-SLP Speech Language Pathologist  Georgetta Haber 09/17/2023,3:43 PM

## 2023-09-17 NOTE — TOC Progression Note (Signed)
Transition of Care (TOC) - Progression Note    Patient Details  Name: Mark CANNATA Sr. MRN: [id number] Date of Birth: [birth year]  Transition of Care [facility]) CM/SW Contact  Villa Herb, [place] Phone Number: 09/17/2023, 11:49 AM  Clinical Narrative:    CSW spoke with pts spouse to review bed offers. At this time they accept bed offer at [facility]. CSW updated Destiny in admissions with [facility] of plan. Insurance Berkley Harvey has been started at this time. TOC to follow.   Expected Discharge Plan: Skilled Nursing Facility Barriers to Discharge: Continued Medical Work up  Expected Discharge Plan and Services In-house Referral: Clinical Social Work Discharge Planning Services: CM Consult Post Acute Care Choice: Skilled Nursing Facility Living arrangements for the past 2 months: Single Family Home                                       Social Determinants of Health (SDOH) Interventions SDOH Screenings   Food Insecurity: No Food Insecurity (09/16/2023)  Housing: Low Risk  (09/16/2023)  Transportation Needs: No Transportation Needs (09/16/2023)  Utilities: Not At Risk (09/16/2023)  Alcohol Screen: Low Risk  (08/22/2022)  Depression (PHQ2-9): High Risk (08/21/2023)  Financial Resource Strain: Low Risk  (08/22/2022)  Physical Activity: Inactive (08/22/2022)  Social Connections: Moderately Integrated (09/16/2023)  Stress: No Stress Concern Present (08/22/2022)  Tobacco Use: Low Risk  (09/16/2023)    Readmission Risk Interventions    05/29/2023    2:05 PM 04/18/2021   12:16 PM 04/16/2021    3:07 PM  Readmission Risk Prevention Plan  Transportation Screening Complete  Complete  HRI or Home Care Consult Complete    Social Work Consult for Recovery Care Planning/Counseling Complete    Palliative Care Screening Not Applicable    Medication Review Oceanographer) Complete  Complete  HRI or Home Care Consult   Complete  SW Recovery Care/Counseling Consult   Complete  Palliative Care Screening    Not Applicable  Skilled Nursing Facility  Not Applicable Not Complete

## 2023-09-17 NOTE — Progress Notes (Signed)
Progress Note   Patient: [name]:[id number] DOB: 04-22-1941 DOA: 09/16/2023     0 DOS: the patient was seen and examined on 09/17/2023   Brief hospital admission course: [name] Sr. is a 83 y.o. male with medical history significant for  ESRD on HD MWF, HTN, HLD, DM II, nephrolithiasis , hearing loss, history of palpitations/SVT presents to ED on 09/16/23--by [facility] from home with c/o weakness and difficulty swallowing for the past couple of days. He has felt like he has been choking on his food and liquid.    Last dialysis session was yesterday. Wife reported to EMS that she has seen a gradual decline over the last 6 months, but even more over the last 2 weeks. He is normally able to drive himself to dialysis, but afterwards he can't even walk back inside.    Additional history obtained from wife who is concerns about dysphagia, especially over last 2 weeks with post prandial emesis even with oatmeal and liquids -Emesis is without bile or blood--just ingested gastric contents  No diarrhea No fever  Or chills  No CP,  C/o weakness and fall risk    In ED  CT Head w/o acute findings  CXR w/o acute findings Covid, flu and RSV negative Na-132,  alk phos 323, otherwise LFT are not elevated WBC-10.6, Hgb 7.8 , platelets 106 Serum iron, ferritin, B12 and Folate are Not low  Assessment and Plan: 1-dysphagia -Worsening the last 2 weeks with associated postprandial emesis -Continue n.p.o. status -Continue as needed analgesics and the use of PPI -Follow GI service recommendation -Plan for endoscopy later today.  2-end-stage renal disease -Appreciate assistance and recommendation by nephrology -Patient regular dialysis day Monday, Wednesday and Friday.  3-anemia of chronic disease -No acute bleeding appreciated -IV iron and Epogen therapy as per nephrology discretion. -Hemoglobin 7.2.  4-generalized weakness -Evaluation by physical therapy appreciated;  recommendation for skilled nursing facility at discharge.  5-type 2 diabetes mellitus with nephropathy -A1c 6.5 -Continue sliding scale insulin and follow CBG fluctuation -Mild hypoglycemic event in the setting of n.p.o. status -Long-acting insulin has been discontinued.  6-hypertension/SVT -Blood pressure stable -Continue to follow vital sign -Holding Cardizem at the moment.  7-depression/anxiety -Continue treatment with Abilify-Zoloft -As needed trazodone at bedtime.  8-pressure injury -Stage II; see below for details -Continue constant repositioning and preventive measures. -POA      Subjective:  Afebrile, no nausea, no vomiting, no chest pain.  Still reporting some difficulty swallowing.  Physical Exam: Vitals:   09/17/23 1330 09/17/23 1345 09/17/23 1438 09/17/23 1645  BP: (!) 97/55 107/63 (!) 109/32 (!) 112/42  Pulse:  95 100 (!) 103  Resp: 18 14 16 18   Temp:   98.5 F (36.9 C) 98 F (36.7 C)  TempSrc:   Oral Oral  SpO2:  90% 93% 96%  Weight:      Height:       General exam: Alert, awake, following commands appropriately and in no major distress.  Patient is afebrile. Respiratory system: No using accessory muscle.  Good saturation on room air. Cardiovascular system:RRR. No rubs or gallops. Gastrointestinal system: Abdomen is nondistended, soft and nontender. No organomegaly or masses felt. Normal bowel sounds heard. Central nervous system: No focal neurological deficits. Extremities: No cyanosis or clubbing. Skin: No petechiae.  Stage II buttocks and coccyx pressure injury present at time of admission without signs of superimposed infection. Psychiatry: Judgement and insight appear normal. Mood & affect appropriate.   Data Reviewed:  Basic metabolic panel: Sodium 133, potassium 4.7, chloride 97, bicarb 24, glucose 82, BUN 52, creatinine 2.7 and GFR 22 CBC: WBCs 9.7, hemoglobin 7.2 and platelet count 107K Ferritin: 1298   Family Communication: No family at  bedside.  Disposition: Status is: Observation The patient remains OBS appropriate and will d/c before 2 midnights.   Planned Discharge Destination: Home  Time spent: 35 minutes  Author: Vassie Loll, MD 09/17/2023 6:51 PM  For on call review [email or web address].

## 2023-09-17 NOTE — Progress Notes (Signed)
SLP Cancellation Note  Patient Details Name: Mark KOHLMANN Sr. MRN: [id number] DOB: [birth year]   Cancelled treatment:       Reason Eval/Treat Not Completed: Patient at procedure or test/unavailable; Attempted to see Pt to screen for needs prior to his EGD, however Pt was in dialysis and then taken down for his EGD. SLP will plan to see Pt after his EGD if awake/appropriate and as schedule permits.  Thank you,  Havery Moros, CCC-SLP [phone]    Makaley Storts 09/17/2023, 12:33 PM

## 2023-09-17 NOTE — Consult Note (Signed)
ESRD Consult Note  Requesting provider: Vassie Loll Service requesting consult: Hospitalist Reason for consult: ESRD, provision of dialysis Indication for acute dialysis?: End Stage Renal Disease  Outpatient dialysis unit: [facility] Outpatient dialysis prescription: F180, Bfr 400, 3K, 3 Ca, 4hrs, EDW 63kg, LUE AVG, , mircera on 1/29, calcitriol  Assessment/Recommendations: Mark L Schaus Sr. is a/an 83 y.o. male with a past medical history notable for ESRD on HD admitted with FTT and dysphagia  # ESRD: Continue dialysis on MWF schedule  # Volume/ hypertension: Weight loss over the last 6 months. Appears near euvolemic. Re-eval EDW during hospitalization. Hold hime BP meds if able given low BPs  # Anemia of Chronic Kidney Disease: Hemoglobin 7s. Aranesp ordered for dialysis today. Obtain iron studies  # Secondary Hyperparathyroidism/Hyperphosphatemia: Continue home calcitriol.  Phosphorus normal.  Hold home Phos first binders for now  # Vascular access: AVG with no issues  # Dysphagia: Workup per GI with EGD and swallow evaluation  # Failure to thrive: Concerning trajectory over the past 6 months.  Consider palliative care  # Additional recommendations: - Dose all meds for creatinine clearance < 10 ml/min  - Unless absolutely necessary, no MRIs with gadolinium.  - Implement save arm precautions.  Prefer needle sticks in the dorsum of the hands or wrists.  No blood pressure measurements in arm. - If blood transfusion is requested during hemodialysis sessions, please alert [place] prior to the session.  - Use synthetic opioids (Fentanyl/Dilaudid) if needed     Recommendations conveyed to primary service.    Darnell Level  [facility] 09/17/2023 9:55 AM   _____________________________________________________________________________________ CC: weakness  History of Present Illness: Mark Art Sr. is a/an 83 y.o. male with a past medical  history of ESRD who presents with weakness.  Patient presented to the hospital yesterday complaining of weakness as well as difficulty swallowing for several days.  Felt like he was choking on a lot of food and sometimes the food will come back up.  Also choking on liquid.  The wife felt that the patient has really declined over the past 6 months and then the last 2 weeks has been worse.  She has noted weight loss.  Patient has had difficulties ambulating and falling at home.  Sometimes chest discomfort with eating food.  Denies fevers, chills, shortness of breath, diarrhea.  Tolerating dialysis with no issues.  GI consulted on the patient and recommended EGD as well as swallow evaluation.  Labs mostly consistent with dialysis.  Last had dialysis on Monday.  Also has anemia.   Medications:  Current Facility-Administered Medications  Medication Dose Route Frequency Provider Last Rate Last Admin   0.9 %  sodium chloride infusion   Intravenous PRN Emokpae, Courage, MD       acetaminophen (TYLENOL) tablet 650 mg  650 mg Oral Q6H PRN Mariea Clonts, Courage, MD   650 mg at 09/16/23 2122   Or   acetaminophen (TYLENOL) suppository 650 mg  650 mg Rectal Q6H PRN Emokpae, Courage, MD       ARIPiprazole (ABILIFY) tablet 2 mg  2 mg Oral QHS Emokpae, Courage, MD   2 mg at 09/16/23 2135   bisacodyl (DULCOLAX) suppository 10 mg  10 mg Rectal Daily PRN Emokpae, Courage, MD       calcitRIOL (ROCALTROL) capsule 1 mcg  1 mcg Oral Q M,W,F Emokpae, Courage, MD       Chlorhexidine Gluconate Cloth 2 % PADS 6 each  6 each Topical  [id number] Shon Hale, MD   6 each at 09/17/23 0555   Darbepoetin Alfa (ARANESP) injection 150 mcg  150 mcg Subcutaneous Q Wed-1800 Shon Hale, MD       [START ON 09/18/2023] diltiazem (CARDIZEM CD) 24 hr capsule 240 mg  240 mg Oral Daily Emokpae, Courage, MD       heparin injection 5,000 Units  5,000 Units Subcutaneous Q8H Mariea Clonts, Courage, MD   5,000 Units at 09/16/23 2122    HYDROcodone-acetaminophen (NORCO/VICODIN) 5-325 MG per tablet 1 tablet  1 tablet Oral Q12H PRN Emokpae, Courage, MD       insulin aspart (novoLOG) injection 0-5 Units  0-5 Units Subcutaneous QHS Emokpae, Courage, MD       insulin aspart (novoLOG) injection 0-9 Units  0-9 Units Subcutaneous TID WC Emokpae, Courage, MD       insulin glargine-yfgn (SEMGLEE) injection 6 Units  6 Units Subcutaneous QHS Emokpae, Courage, MD       ondansetron (ZOFRAN) tablet 4 mg  4 mg Oral Q6H PRN Emokpae, Courage, MD       Or   ondansetron (ZOFRAN) injection 4 mg  4 mg Intravenous Q6H PRN Emokpae, Courage, MD       pantoprazole (PROTONIX) injection 40 mg  40 mg Intravenous Q12H Emokpae, Courage, MD   40 mg at 09/16/23 2116   pentafluoroprop-tetrafluoroeth (GEBAUERS) aerosol 1 Application  1 Application Topical PRN Darnell Level, MD       polyethylene glycol (MIRALAX / GLYCOLAX) packet 17 g  17 g Oral Daily PRN Emokpae, Courage, MD       pravastatin (PRAVACHOL) tablet 10 mg  10 mg Oral q1800 Emokpae, Courage, MD       sertraline (ZOLOFT) tablet 50 mg  50 mg Oral Daily Emokpae, Courage, MD       sevelamer carbonate (RENVELA) tablet 2,400 mg  2,400 mg Oral TID WC Emokpae, Courage, MD       sodium chloride flush (NS) 0.9 % injection 3 mL  3 mL Intravenous Q12H Emokpae, Courage, MD   3 mL at 09/16/23 2129   sodium chloride flush (NS) 0.9 % injection 3 mL  3 mL Intravenous Q12H Emokpae, Courage, MD   3 mL at 09/16/23 2129   sodium chloride flush (NS) 0.9 % injection 3 mL  3 mL Intravenous PRN Shon Hale, MD       traZODone (DESYREL) tablet 50 mg  50 mg Oral QHS PRN Emokpae, Courage, MD         ALLERGIES Enalapril maleate  MEDICAL HISTORY Past Medical History:  Diagnosis Date   Arthritis    lower back   Chronic back pain    Disc disease   Chronic kidney disease    Coronary atherosclerosis of native coronary artery    Coronary calcifications by chest CT, Myoview demonstrating inferolateral scar    Deafness in right ear    Diabetes mellitus    Diabetic retinopathy (HCC)    moderate nonproliferative with macular edema in right eye   Diastolic dysfunction 12/2011   Grade 1.   Essential hypertension, benign    Heart murmur    in past   HOH (hard of hearing)    Hyperkalemia    Kidney stones    Mixed hyperlipidemia    NAFLD (nonalcoholic fatty liver disease)    Pancreatic insufficiency    Diagnosed at [facility]   Prostate cancer [facility]) 2011   Shortness of breath dyspnea    occasional - liver pressing on right lung, decreased  capacity   Subdural hematoma (HCC)    Type 2 diabetes mellitus (HCC)    Wears hearing aid    left     SOCIAL HISTORY Social History   Socioeconomic History   Marital status: Married    Spouse name: Darlene   Number of children: 3   Years of education: Not on file   Highest education level: Not on file  Occupational History   Not on file  Tobacco Use   Smoking status: Never   Smokeless tobacco: Never  Vaping Use   Vaping status: Never Used  Substance and Sexual Activity   Alcohol use: No   Drug use: No   Sexual activity: Yes  Other Topics Concern   Not on file  Social History Narrative   3 children, 1 daughter and 1 son sorry deceased. 1 daughter living in [place] and 1 step child   Married x 13 years 08/25/21.   7 grandchildren   1 great grandchild   Social Drivers of Corporate investment banker Strain: Low Risk  (08/22/2022)   Overall Financial Resource Strain (CARDIA)    Difficulty of Paying Living Expenses: Not hard at all  Food Insecurity: No Food Insecurity (09/16/2023)   Hunger Vital Sign    Worried About Running Out of Food in the Last Year: Never true    Ran Out of Food in the Last Year: Never true  Transportation Needs: No Transportation Needs (09/16/2023)   PRAPARE - Administrator, Civil Service (Medical): No    Lack of Transportation (Non-Medical): No  Physical Activity: Inactive (08/22/2022)   Exercise Vital Sign     Days of Exercise per Week: 0 days    Minutes of Exercise per Session: 0 min  Stress: No Stress Concern Present (08/22/2022)   [employer] of Occupational Health - Occupational Stress Questionnaire    Feeling of Stress : Not at all  Social Connections: Moderately Integrated (09/16/2023)   Social Connection and Isolation Panel [NHANES]    Frequency of Communication with Friends and Family: Once a week    Frequency of Social Gatherings with Friends and Family: Once a week    Attends Religious Services: 1 to 4 times per year    Active Member of [employer] or Organizations: Yes    Attends Banker Meetings: 1 to 4 times per year    Marital Status: Married  Catering manager Violence: Not At Risk (09/16/2023)   Humiliation, Afraid, Rape, and Kick questionnaire    Fear of Current or Ex-Partner: No    Emotionally Abused: No    Physically Abused: No    Sexually Abused: No     FAMILY HISTORY Family History  Problem Relation Age of Onset   Diabetes type II Mother    Heart attack Father    Colon cancer Neg Hx    Gastric cancer Neg Hx    Esophageal cancer Neg Hx       Review of Systems: 12 systems reviewed Otherwise as per HPI, all other systems reviewed and negative  Physical Exam: Vitals:   09/17/23 0900 09/17/23 0930  BP: 98/60 (!) 104/48  Pulse:    Resp: 15 16  Temp:    SpO2:     No intake/output data recorded.  Intake/Output Summary (Last 24 hours) at 09/17/2023 0955 Last data filed at 09/17/2023 0500 Gross per 24 hour  Intake 360 ml  Output --  Net 360 ml   General: chronically ill appearing, nad HEENT: anicteric  sclera, oropharynx clear without lesions CV: normal rate, no rubs, no peripheral edema Lungs: clear to auscultation bilaterally, normal work of breathing Abd: soft, non-tender, non-distended Skin: no visible lesions or rashes Psych: alert, engaged, appropriate mood and affect Musculoskeletal: no obvious deformities Neuro: normal speech, no  gross focal deficits   Test Results Reviewed Lab Results  Component Value Date   NA 133 (L) 09/17/2023   K 4.7 09/17/2023   CL 97 (L) 09/17/2023   CO2 24 09/17/2023   BUN 52 (H) 09/17/2023   CREATININE 2.77 (H) 09/17/2023   GFR 65.09 01/29/2017   CALCIUM 7.0 (L) 09/17/2023   ALBUMIN 1.6 (L) 09/16/2023   PHOS 4.4 09/16/2023    CBC Recent Labs  Lab 09/16/23 0957 09/17/23 0455  WBC 10.6* 9.7  NEUTROABS 9.2*  --   HGB 7.8* 7.2*  HCT 25.4* 22.9*  MCV 109.0* 106.0*  PLT 106* 107*    I have reviewed all relevant outside healthcare records related to the patient's current hospitalization

## 2023-09-17 NOTE — Procedures (Signed)
Tolerated hemodialysis Txmt well, bp soft, albumin 25 given @ the start of txmt ; ran 3 hrs (tx was shortened for pending EGD - per Dr. Darla Lesches v.o.), removed 1.5 L , no c/o discomfort. Small, 1 cm X 3 cm skin tear where the tape to anchor needle was. Vaseline Dsg. Applied and wrapped with gauze . Pt states his skin tears easy, and both arms covered in purple areas on thin delicate skin. Discharged to endo for procedure

## 2023-09-17 NOTE — Interval H&P Note (Signed)
History and Physical Interval Note:  09/17/2023 12:08 PM  Mark L Goldbach Sr.  has presented today for surgery, with the diagnosis of dysphagia, weight loss.  The various methods of treatment have been discussed with the patient and family. After consideration of risks, benefits and other options for treatment, the patient has consented to  Procedure(s) with comments: ESOPHAGOGASTRODUODENOSCOPY (EGD) WITH PROPOFOL (N/A) - and dilation as a surgical intervention.  The patient's history has been reviewed, patient examined, no change in status, stable for surgery.  I have reviewed the patient's chart and labs.  Questions were answered to the patient's satisfaction.       Patient seen and examined in short stay.  Chart reviewed.  Progressive esophageal dysphagia, failure to thrive.  Agree with need for EGD with possible esophageal dilation is feasible/appropriate per plan.  The risks, benefits, limitations, alternatives and imponderables have been reviewed with the patient. Potential for esophageal dilation, biopsy, etc. have also been reviewed.  Questions have been answered. All parties agreeable.    [name]

## 2023-09-18 ENCOUNTER — Encounter (HOSPITAL_COMMUNITY): Payer: Self-pay | Admitting: Internal Medicine

## 2023-09-18 DIAGNOSIS — I10 Essential (primary) hypertension: Secondary | ICD-10-CM | POA: Diagnosis not present

## 2023-09-18 DIAGNOSIS — E1165 Type 2 diabetes mellitus with hyperglycemia: Secondary | ICD-10-CM | POA: Diagnosis not present

## 2023-09-18 DIAGNOSIS — N186 End stage renal disease: Secondary | ICD-10-CM | POA: Diagnosis not present

## 2023-09-18 DIAGNOSIS — E1142 Type 2 diabetes mellitus with diabetic polyneuropathy: Secondary | ICD-10-CM | POA: Diagnosis not present

## 2023-09-18 DIAGNOSIS — E1169 Type 2 diabetes mellitus with other specified complication: Secondary | ICD-10-CM | POA: Diagnosis not present

## 2023-09-18 DIAGNOSIS — R1319 Other dysphagia: Secondary | ICD-10-CM | POA: Diagnosis not present

## 2023-09-18 DIAGNOSIS — E785 Hyperlipidemia, unspecified: Secondary | ICD-10-CM | POA: Diagnosis not present

## 2023-09-18 LAB — GLUCOSE, CAPILLARY
Glucose-Capillary: 66 mg/dL — ABNORMAL LOW (ref 70–99)
Glucose-Capillary: 80 mg/dL (ref 70–99)
Glucose-Capillary: 125 mg/dL — ABNORMAL HIGH (ref 70–99)
Glucose-Capillary: 29 mg/dL — CL (ref 70–99)
Glucose-Capillary: 145 mg/dL — ABNORMAL HIGH (ref 70–99)

## 2023-09-18 LAB — SURGICAL PATHOLOGY

## 2023-09-18 MED ORDER — FLUCONAZOLE 100 MG PO TABS
200.0000 mg | ORAL_TABLET | ORAL | Status: DC
Start: 2023-09-19 — End: 2023-09-22
  Administered 2023-09-19: 200 mg via ORAL
  Filled 2023-09-18: qty 2

## 2023-09-18 MED ORDER — FLUCONAZOLE 150 MG PO TABS
400.0000 mg | ORAL_TABLET | Freq: Once | ORAL | Status: AC
  Administered 2023-09-18: 400 mg via ORAL
  Filled 2023-09-18: qty 1

## 2023-09-18 MED ORDER — DEXTROSE 50 % IV SOLN
25.0000 g | INTRAVENOUS | Status: AC
  Administered 2023-09-18: 25 g via INTRAVENOUS
  Filled 2023-09-18: qty 50

## 2023-09-18 NOTE — Progress Notes (Addendum)
Nephrology Follow-Up Consult note   Outpatient dialysis unit: [facility] Outpatient dialysis prescription: F180, Bfr 400, 3K, 3 Ca, 4hrs, EDW 63kg, LUE AVG, , mircera on 1/29, calcitriol   Assessment/Recommendations: Mark L Detwiler Sr. is a/an 83 y.o. male with a past medical history notable for ESRD on HD admitted with FTT and dysphagia   # ESRD: Continue dialysis on MWF schedule   # Volume/ hypertension: Weight loss over the last 6 months. Appears near euvolemic. Re-eval EDW during hospitalization. On dilt, watch for low BPs   # Anemia of Chronic Kidney Disease: Hemoglobin 7s. Aranesp given 2/12. Iron replete.   # Secondary Hyperparathyroidism/Hyperphosphatemia: Continue home calcitriol.  Phosphorus normal.  Hold home Phos first binders for now   # Vascular access: AVG with no issues   # Dysphagia: EGD with diffuse narrowing and had dilation. Symptoms persist. Defer to primary and GI.   # Failure to thrive: Concerning trajectory over the past 6 months.  Consider palliative care   # Additional recommendations: - Dose all meds for creatinine clearance < 10 ml/min  - Unless absolutely necessary, no MRIs with gadolinium.  - Implement save arm precautions.  Prefer needle sticks in the dorsum of the hands or wrists.  No blood pressure measurements in arm. - If blood transfusion is requested during hemodialysis sessions, please alert [place] prior to the session.  - Use synthetic opioids (Fentanyl/Dilaudid) if needed   Recommendations conveyed to primary service.    Darnell Level [facility] 09/18/2023 9:14 AM  ___________________________________________________________  CC: FTT, dysphagia  Interval History/Subjective: Patient tolerated HD and had EGD yesterday with some areas of plaque in the esophagus. Did have diffusely narrowed tubular esophagus that was dilated. Unfortunately patient states symptoms persist today. Not keeping anything  down.   Medications:  Current Facility-Administered Medications  Medication Dose Route Frequency Provider Last Rate Last Admin   acetaminophen (TYLENOL) tablet 650 mg  650 mg Oral Q6H PRN Corbin Ade, MD   650 mg at 09/16/23 2122   Or   acetaminophen (TYLENOL) suppository 650 mg  650 mg Rectal Q6H PRN Corbin Ade, MD       ARIPiprazole (ABILIFY) tablet 2 mg  2 mg Oral QHS Corbin Ade, MD   2 mg at 09/17/23 2207   bisacodyl (DULCOLAX) suppository 10 mg  10 mg Rectal Daily PRN Corbin Ade, MD       calcitRIOL (ROCALTROL) capsule 1 mcg  1 mcg Oral Q M,W,F Rourk, Gerrit Friends, MD       Chlorhexidine Gluconate Cloth 2 % PADS 6 each  6 each Topical Q0600 Corbin Ade, MD   6 each at 09/18/23 0602   Darbepoetin Alfa (ARANESP) injection 150 mcg  150 mcg Subcutaneous Q Wed-1800 Corbin Ade, MD   150 mcg at 09/17/23 1753   diltiazem (CARDIZEM CD) 24 hr capsule 240 mg  240 mg Oral Daily Corbin Ade, MD       heparin injection 5,000 Units  5,000 Units Subcutaneous Q8H Corbin Ade, MD   5,000 Units at 09/17/23 2204   HYDROcodone-acetaminophen (NORCO/VICODIN) 5-325 MG per tablet 1 tablet  1 tablet Oral Q12H PRN Corbin Ade, MD       insulin aspart (novoLOG) injection 0-5 Units  0-5 Units Subcutaneous QHS Rourk, Gerrit Friends, MD       insulin aspart (novoLOG) injection 0-9 Units  0-9 Units Subcutaneous TID WC Rourk, Gerrit Friends, MD  ondansetron (ZOFRAN) tablet 4 mg  4 mg Oral Q6H PRN Corbin Ade, MD       Or   ondansetron [facility]) injection 4 mg  4 mg Intravenous Q6H PRN Corbin Ade, MD       pantoprazole (PROTONIX) injection 40 mg  40 mg Intravenous Q12H Corbin Ade, MD   40 mg at 09/17/23 2204   pentafluoroprop-tetrafluoroeth (GEBAUERS) aerosol 1 Application  1 Application Topical PRN Rourk, Gerrit Friends, MD       polyethylene glycol (MIRALAX / GLYCOLAX) packet 17 g  17 g Oral Daily PRN Rourk, Gerrit Friends, MD       sertraline (ZOLOFT) tablet 50 mg  50 mg Oral Daily  Rourk, Gerrit Friends, MD       sodium chloride flush (NS) 0.9 % injection 3 mL  3 mL Intravenous Q12H Corbin Ade, MD   3 mL at 09/17/23 2205   sodium chloride flush (NS) 0.9 % injection 3 mL  3 mL Intravenous Q12H Corbin Ade, MD   3 mL at 09/17/23 2205   sodium chloride flush (NS) 0.9 % injection 3 mL  3 mL Intravenous PRN Corbin Ade, MD       traZODone (DESYREL) tablet 50 mg  50 mg Oral QHS PRN Corbin Ade, MD          Review of Systems: 10 systems reviewed and negative except per interval history/subjective  Physical Exam: Vitals:   09/17/23 2016 09/18/23 0355  BP: (!) 120/46 110/63  Pulse: (!) 105 (!) 108  Resp: 16 16  Temp: 97.6 F (36.4 C) 98.1 F (36.7 C)  SpO2: 96% 95%   No intake/output data recorded.  Intake/Output Summary (Last 24 hours) at 09/18/2023 0914 Last data filed at 09/18/2023 0300 Gross per 24 hour  Intake 540 ml  Output 1500 ml  Net -960 ml   Constitutional: chronically ill appearing, lying in bed ENMT: ears and nose without scars or lesions, MMM CV: tachycardia, no edema Respiratory: bilateral chest rise, normal work of breathing Gastrointestinal: soft, non-tender, no palpable masses or hernias Skin: no visible lesions or rashes Psych: alert, appropriate mood and affect   Test Results I personally reviewed new and old clinical labs and radiology tests Lab Results  Component Value Date   NA 133 (L) 09/17/2023   K 4.7 09/17/2023   CL 97 (L) 09/17/2023   CO2 24 09/17/2023   BUN 52 (H) 09/17/2023   CREATININE 2.77 (H) 09/17/2023   GFR 65.09 01/29/2017   CALCIUM 7.0 (L) 09/17/2023   ALBUMIN 1.6 (L) 09/16/2023   PHOS 4.4 09/16/2023    CBC Recent Labs  Lab 09/16/23 0957 09/17/23 0455  WBC 10.6* 9.7  NEUTROABS 9.2*  --   HGB 7.8* 7.2*  HCT 25.4* 22.9*  MCV 109.0* 106.0*  PLT 106* 107*

## 2023-09-18 NOTE — Progress Notes (Signed)
Chart review. Patient not seen today. Appreciate speech therapy evaluation. Will reassess patient in the morning. His KOH came back positive for candida esophagitis. Will start diflucan 400mg  today. Per hemodialysis dosing for candida esophagitis, he will receive diflucan usual dose 3X/week (MWF) after dialysis for 20 days.    Leanna Battles. [place] [facility] [phone] 2/13/20255:27 PM

## 2023-09-18 NOTE — Progress Notes (Signed)
Speech Language Pathology Treatment: Dysphagia  Patient Details Name: Mark BUENGER Sr. MRN: [id number] DOB: 12/20/1940 Today's Date: 09/18/2023 Time: 4098-1191 SLP Time Calculation (min) (ACUTE ONLY): 24 min  Assessment / Plan / Recommendation Clinical Impression  Pt seen for ongoing dysphagia intervention following EGD and BSE completed yesterday. See results of EGD below. Pt still reports difficulty with regurgitation this AM and little PO intake (still on clears). Pt assessed with ice chips, thin water, nectar juice, and puree. Pt with occasional delayed cough and some audible regurgitation sounds (?). He appeared to tolerate ice chips and small sips of thins and nectars when taken very slowly over time. Suspect esophageal dysmotility and also pharyngeal component, however do not think Pt is appropriate for MBSS today given limited ability for intake at this time. Discussed with Pt and RN and recommend continue with liquids today (consider allowing Ensure if ok with MD) and taken in small sips throughout the day. If Pt can tolerate that, would like to proceed with MBSS tomorrow. SLP will follow.   EGD: Impression: - Plaques in the proximal esophagus and hypopharynx brushed and biopsied after esophageal dilation - Somewhat diffusely narrowed tubular esophagus as described. Subtle ringed appearance - mild Schatzki ring. Dilated. - - Proximal and distal esophageal biopsies. Gastric biopsy. Gastric polyp removed with cold biopsy forceps - No Zenker' s identified. D2 nodule of uncertain significance.    HPI HPI: Mark SAVARESE Sr. is a 83 y.o. male with medical history significant for  ESRD on HD MWF, HTN, HLD, DM II, nephrolithiasis , hearing loss, history of palpitations/SVT presents to ED on 09/16/23--by [facility] from home with c/o weakness and difficulty swallowing for the past couple of days. He has felt like he has been choking on his food and liquid. BSE requested      SLP Plan  Continue with  current plan of care      Recommendations for follow up therapy are one component of a multi-disciplinary discharge planning process, led by the attending physician.  Recommendations may be updated based on patient status, additional functional criteria and insurance authorization.    Recommendations  Diet recommendations: Thin liquid Liquids provided via: Cup;Straw Medication Administration: Whole meds with puree Supervision: Patient able to self feed;Full supervision/cueing for compensatory strategies Compensations: Small sips/bites;Slow rate Postural Changes and/or Swallow Maneuvers: Seated upright 90 degrees;Upright 30-60 min after meal                  Oral care BID   Intermittent Supervision/Assistance Dysphagia, oropharyngeal phase (R13.12);Dysphagia, unspecified (R13.10)     Continue with current plan of care    Thank you,  Havery Moros, CCC-SLP [phone]] Klint Lezcano  09/18/2023, 9:59 AM

## 2023-09-18 NOTE — TOC Progression Note (Signed)
Transition of Care (TOC) - Progression Note    Patient Details  Name: Mark RINKS Sr. MRN: [id number] Date of Birth: 12/15/1940  Transition of Care [facility]) CM/SW Contact  Villa Herb, [place] Phone Number: 09/18/2023, 10:09 AM  Clinical Narrative:    CSW updated by [facility] that pts SNF insurance auth was sent to the medical director for review. CSW udpated that Dr. Logan Bores medical director with [facility] is requesting peer to peer be completed by 3pm today. CSW updated MD of this and provided MD with contact info to reach Dr. Logan Bores to completed peer to peer for SNF [facility]. TOC to follow.   Expected Discharge Plan: Skilled Nursing Facility Barriers to Discharge: Continued Medical Work up  Expected Discharge Plan and Services In-house Referral: Clinical Social Work Discharge Planning Services: CM Consult Post Acute Care Choice: Skilled Nursing Facility Living arrangements for the past 2 months: Single Family Home                                       Social Determinants of Health (SDOH) Interventions SDOH Screenings   Food Insecurity: No Food Insecurity (09/16/2023)  Housing: Low Risk  (09/16/2023)  Transportation Needs: No Transportation Needs (09/16/2023)  Utilities: Not At Risk (09/16/2023)  Alcohol Screen: Low Risk  (08/22/2022)  Depression (PHQ2-9): High Risk (08/21/2023)  Financial Resource Strain: Low Risk  (08/22/2022)  Physical Activity: Inactive (08/22/2022)  Social Connections: Moderately Integrated (09/16/2023)  Stress: No Stress Concern Present (08/22/2022)  Tobacco Use: Low Risk  (09/17/2023)    Readmission Risk Interventions    05/29/2023    2:05 PM 04/18/2021   12:16 PM 04/16/2021    3:07 PM  Readmission Risk Prevention Plan  Transportation Screening Complete  Complete  HRI or Home Care Consult Complete    Social Work Consult for Recovery Care Planning/Counseling Complete    Palliative Care Screening Not Applicable    Medication Review Oceanographer)  Complete  Complete  HRI or Home Care Consult   Complete  SW Recovery Care/Counseling Consult   Complete  Palliative Care Screening   Not Applicable  Skilled Nursing Facility  Not Applicable Not Complete

## 2023-09-18 NOTE — Plan of Care (Signed)
Problem: Activity: Goal: Risk for activity intolerance will decrease Outcome: Progressing   Problem: Coping: Goal: Level of anxiety will decrease Outcome: Progressing   Problem: Elimination: Goal: Will not experience complications related to urinary retention Outcome: Progressing

## 2023-09-18 NOTE — Progress Notes (Signed)
Physical Therapy Treatment Patient Details Name: Mark FRANCHI Sr. MRN: [id number] DOB: Jul 23, 1941 Today's Date: 09/18/2023   History of Present Illness 83 y.o. male with a PMH of CAD, hyperlipidemia, hypertension, type 2 diabetes, SVT, and ESRD on HD (n/w/f).     Per wife, patient has been steadily declining over the past 6 months, but he has significantly worse in the last 2 weeks.  Pt unable to walk, stand up even. He has had few falls at home.  Pt also has been having difficulty with swallowing, he will choke and spit upon po intake. Pt has lost weight. Upon eating food patient will choke, spits it out. He has had problems with liquid intake as well.  Dr. Tanya Nones is the PCP.  He put patient on Protonix, with the thought that if he does not improve they can consult him to GI.  No previous history of dysphagia per wife.  Dr. Haynes Dage also suggest that the patient will likely need admission to the hospital for rehab admission.    PT Comments  Pt soiled assisted nursing with change of gown and bed working on bed mobility, supine to sit with mod assist. Sitting balance fair - with pt wanting to go backward.  Sit to stand with min assist, pt began having BM again therefore pt was not transferred to chair as he needed to be cleaned again.     If plan is discharge home, recommend the following: Help with stairs or ramp for entrance;Assistance with cooking/housework;A lot of help with walking and/or transfers;A lot of help with bathing/dressing/bathroom   Can travel by private vehicle      yes  Equipment Recommendations    none   Recommendations for Other Services  none     Precautions / Restrictions Precautions Precautions: Fall Restrictions Weight Bearing Restrictions Per Provider Order: No     Mobility  Bed Mobility Overal bed mobility: Needs Assistance Bed Mobility: Supine to Sit, Sit to Supine     Supine to sit: Min assist, Mod assist Sit to supine: Min assist         Transfers Overall transfer level: Needs assistance Equipment used: Rolling walker (2 wheels) Transfers: Sit to/from Stand Sit to Stand: Min assist           General transfer comment: PT began having loose stools again; put back to bed to be cleansed           Communication Communication Communication: No apparent difficulties  Cognition Arousal: Alert Behavior During Therapy: WFL for tasks assessed/performed   PT - Cognitive impairments: No apparent impairments                         Following commands: Intact      Cueing Cueing Techniques: Verbal cues         Pertinent Vitals/Pain Pain Assessment Pain Assessment: No/denies pain       Prior Function            PT Goals (current goals can now be found in the care plan section) Acute Rehab PT Goals Patient Stated Goal: to return home after rehab PT Goal Formulation: With patient Time For Goal Achievement: 09/30/23 Progress towards PT goals: Progressing toward goals    Frequency    Min 3X/week                 End of Session   Activity Tolerance: Patient tolerated treatment well Patient left: in bed;with bed alarm  set;with family/visitor present;with call bell/phone within reach Nurse Communication: Mobility status PT Visit Diagnosis: Unsteadiness on feet (R26.81);Other abnormalities of gait and mobility (R26.89);Muscle weakness (generalized) (M62.81)     Time: 1610-9604 PT Time Calculation (min) (ACUTE ONLY): 12 min  Charges:    $Therapeutic Activity: 8-22 mins PT General Charges $$ ACUTE PT VISIT: 1 Visit                      Virgina Organ, PT CLT [phone]  09/18/2023, 10:43 AM

## 2023-09-18 NOTE — Progress Notes (Signed)
Progress Note   Patient: [name] ZHY:[id number] DOB: 1940-08-20 DOA: 09/16/2023     0 DOS: the patient was seen and examined on 09/18/2023   Brief hospital admission course: EVAAN TIDWELL Sr. is a 83 y.o. male with medical history significant for  ESRD on HD MWF, HTN, HLD, DM II, nephrolithiasis , hearing loss, history of palpitations/SVT presents to ED on 09/16/23--by [facility] from home with c/o weakness and difficulty swallowing for the past couple of days. He has felt like he has been choking on his food and liquid.    Last dialysis session was yesterday. Wife reported to EMS that she has seen a gradual decline over the last 6 months, but even more over the last 2 weeks. He is normally able to drive himself to dialysis, but afterwards he can't even walk back inside.    Additional history obtained from wife who is concerns about dysphagia, especially over last 2 weeks with post prandial emesis even with oatmeal and liquids -Emesis is without bile or blood--just ingested gastric contents  No diarrhea No fever  Or chills  No CP,  C/o weakness and fall risk    In ED  CT Head w/o acute findings  CXR w/o acute findings Covid, flu and RSV negative Na-132,  alk phos 323, otherwise LFT are not elevated WBC-10.6, Hgb 7.8 , platelets 106 Serum iron, ferritin, B12 and Folate are Not low  Assessment and Plan: 1-dysphagia/Candida esophagitis -Worsening the last 2 weeks with associated postprandial emesis -Continue as needed analgesics and PPI -Clear liquid diets recommended by speech therapy and GI at the moment. -Follow GI service further recommendation -Endoscopic evaluation with positive Candida esophagitis; treatment with Diflucan has been started -Continue supportive care.  2-end-stage renal disease -Appreciate assistance and recommendation by nephrology -Patient regular dialysis day Monday, Wednesday and Friday. -Continue to follow renal service recommendations.  3-anemia  of chronic disease -No acute bleeding appreciated -IV iron and Epogen therapy as per nephrology discretion. -Hemoglobin 7.2.  4-generalized weakness -Evaluation by physical therapy appreciated; recommendation for skilled nursing facility at discharge. -Family in agreement to pursued short-term rehabilitation trial and if patient fail to improve considering transition into comfort care and quality management.  5-type 2 diabetes mellitus with nephropathy -A1c 6.5 -No further hypoglycemic event currently appreciated; Long-acting insulin has been discontinued. -Follow CBG fluctuation and continue sliding scale insulin. -Presumed component of bilateral amioatrophic diabetes. -Continue supportive care and pursue rehabilitation.  6-hypertension/SVT -Blood pressure stable -Continue to follow vital sign -Holding Cardizem at the moment.  7-depression/anxiety -Continue treatment with Abilify-Zoloft -As needed trazodone at bedtime.  8-pressure injury -Stage II; see below for details -Continue constant repositioning and preventive measures. -POA   Subjective:  No fever, no chest pain or shortness of breath.  Still experiencing intermittent nausea and expressing generalized weakness and deconditioning.  Physical Exam: Vitals:   09/17/23 2016 09/18/23 0355 09/18/23 0933 09/18/23 1339  BP: (!) 120/46 110/63 (!) 134/59 (!) 97/48  Pulse: (!) 105 (!) 108 (!) 105 80  Resp: 16 16  14   Temp: 97.6 F (36.4 C) 98.1 F (36.7 C)  97.9 F (36.6 C)  TempSrc: Oral   Oral  SpO2: 96% 95%  93%  Weight:      Height:       General exam: Alert, awake, oriented x 3; chronically ill and underweight.  Generally weak/deconditioned and is still experiencing intermittent episode of nausea. Respiratory system: Clear to auscultation. Respiratory effort normal.  Good saturation on room  air. Cardiovascular system:RRR.  No rubs or gallops. Gastrointestinal system: Abdomen is nondistended, soft and nontender.  No organomegaly or masses felt. Normal bowel sounds heard. Central nervous system: No focal neurological deficits.  Generally weak and deconditioned. Extremities: No clubbing. Skin: No petechiae.  Stage II buttocks and coccyx pressure injury present at the information without signs of superimposed infection. Psychiatry: Flat affect appreciated on exam.  Data Reviewed: Basic metabolic panel: Sodium 133, potassium 4.7, chloride 97, bicarb 24, glucose 82, BUN 52, creatinine 2.7 and GFR 22 CBC: WBCs 9.7, hemoglobin 7.2 and platelet count 107K Ferritin: 1298 Surgical biopsy: Demonstrating fungal elements identified morphologically consistent with Candida esophagitis.   Family Communication: Wife updated at bedside.  Disposition: Status is: Observation The patient remains OBS appropriate and will d/c before 2 midnights.   Planned Discharge Destination: Home  Time spent: 35 minutes  Author: Vassie Loll, MD 09/18/2023 8:01 PM  For on call review [email or web address].

## 2023-09-18 NOTE — Progress Notes (Signed)
Glucose earlier was 29.  Alert. Gave dextrose injection and rose to 89.  Having multiple incontinent loose stools today. Contacted Dr. Gwenlyn Perking.   Has been swallowing pills whole with water with not much diffuculty.

## 2023-09-19 ENCOUNTER — Telehealth: Payer: Self-pay | Admitting: Gastroenterology

## 2023-09-19 ENCOUNTER — Observation Stay (HOSPITAL_COMMUNITY): Payer: PPO

## 2023-09-19 DIAGNOSIS — E1142 Type 2 diabetes mellitus with diabetic polyneuropathy: Secondary | ICD-10-CM | POA: Diagnosis not present

## 2023-09-19 DIAGNOSIS — N186 End stage renal disease: Secondary | ICD-10-CM | POA: Diagnosis not present

## 2023-09-19 DIAGNOSIS — R1319 Other dysphagia: Secondary | ICD-10-CM | POA: Diagnosis not present

## 2023-09-19 DIAGNOSIS — E1165 Type 2 diabetes mellitus with hyperglycemia: Secondary | ICD-10-CM | POA: Diagnosis not present

## 2023-09-19 LAB — CBC
HCT: 22.5 % — ABNORMAL LOW (ref 39.0–52.0)
Hemoglobin: 7.1 g/dL — ABNORMAL LOW (ref 13.0–17.0)
MCH: 33 pg (ref 26.0–34.0)
MCHC: 31.6 g/dL (ref 30.0–36.0)
MCV: 104.7 fL — ABNORMAL HIGH (ref 80.0–100.0)
Platelets: 107 10*3/uL — ABNORMAL LOW (ref 150–400)
RBC: 2.15 MIL/uL — ABNORMAL LOW (ref 4.22–5.81)
RDW: 14.8 % (ref 11.5–15.5)
WBC: 9.6 10*3/uL (ref 4.0–10.5)
nRBC: 0 % (ref 0.0–0.2)

## 2023-09-19 LAB — RENAL FUNCTION PANEL
Albumin: 1.6 g/dL — ABNORMAL LOW (ref 3.5–5.0)
Anion gap: 11 (ref 5–15)
BUN: 37 mg/dL — ABNORMAL HIGH (ref 8–23)
CO2: 23 mmol/L (ref 22–32)
Calcium: 6.8 mg/dL — ABNORMAL LOW (ref 8.9–10.3)
Chloride: 95 mmol/L — ABNORMAL LOW (ref 98–111)
Creatinine, Ser: 3.45 mg/dL — ABNORMAL HIGH (ref 0.61–1.24)
GFR, Estimated: 17 mL/min — ABNORMAL LOW (ref >60)
Glucose, Bld: 190 mg/dL — ABNORMAL HIGH (ref 70–99)
Phosphorus: 3.8 mg/dL (ref 2.5–4.6)
Potassium: 3.5 mmol/L (ref 3.5–5.1)
Sodium: 129 mmol/L — ABNORMAL LOW (ref 135–145)

## 2023-09-19 LAB — GLUCOSE, CAPILLARY
Glucose-Capillary: 100 mg/dL — ABNORMAL HIGH (ref 70–99)
Glucose-Capillary: 101 mg/dL — ABNORMAL HIGH (ref 70–99)
Glucose-Capillary: 212 mg/dL — ABNORMAL HIGH (ref 70–99)
Glucose-Capillary: 78 mg/dL (ref 70–99)
Glucose-Capillary: 93 mg/dL (ref 70–99)
Glucose-Capillary: 94 mg/dL (ref 70–99)
Glucose-Capillary: 97 mg/dL (ref 70–99)

## 2023-09-19 NOTE — Progress Notes (Signed)
Patient will not be seen today. Speech following given ongoing complaints of dysphagia and recommended MBSS today if tolerating clears.  Per nursing patient was able to swallow pills whole yesterday,  EGD 2/12 with esophagitis and brushings + for yeast. He will be started of fluconazole MWF after dialysis for 20 days per recommendation from pharmacy. .   There was also a gastric polyp that was removed and a duodenal mucosal nodule or uncertain significance noted. Dysphagia could be oropharyngeal and secondary to candida infection. If able to tolerate a diet and speech agrees then would be okay to discharge from GI standpoint.  He will receive follow up outpatient in the clinic which we will arrange. Gi will sign off, feel free to reach out with any questions.   Brooke Bonito, MSN, APRN, FNP-BC, AGACNP-BC [facility] at [facility]

## 2023-09-19 NOTE — Progress Notes (Signed)
Progress Note   Patient: Jacquis Paxton UJW:[id number] DOB: 02-16-1941 DOA: 09/16/2023     0 DOS: the patient was seen and examined on 09/19/2023   Brief hospital admission course: JEET SHOUGH Sr. is a 83 y.o. male with medical history significant for  ESRD on HD MWF, HTN, HLD, DM II, nephrolithiasis , hearing loss, history of palpitations/SVT presents to ED on 09/16/23--by [facility] from home with c/o weakness and difficulty swallowing for the past couple of days. He has felt like he has been choking on his food and liquid.    Last dialysis session was yesterday. Wife reported to EMS that she has seen a gradual decline over the last 6 months, but even more over the last 2 weeks. He is normally able to drive himself to dialysis, but afterwards he can't even walk back inside.    Additional history obtained from wife who is concerns about dysphagia, especially over last 2 weeks with post prandial emesis even with oatmeal and liquids -Emesis is without bile or blood--just ingested gastric contents  No diarrhea No fever  Or chills  No CP,  C/o weakness and fall risk    In ED  CT Head w/o acute findings  CXR w/o acute findings Covid, flu and RSV negative Na-132,  alk phos 323, otherwise LFT are not elevated WBC-10.6, Hgb 7.8 , platelets 106 Serum iron, ferritin, B12 and Folate are Not low  Assessment and Plan: 1-dysphagia/Candida esophagitis -Worsening the last 2 weeks with associated postprandial emesis -Continue as needed analgesics and PPI -Clear liquid diets recommended by speech therapy and GI at the moment. -Follow GI service further recommendation -Endoscopic evaluation with positive Candida esophagitis; treatment with Diflucan has been started -Continue supportive care. -Speech therapy to pursued MBS later today and provide further recommendations regarding diet consistency.  2-end-stage renal disease -Appreciate assistance and recommendation by nephrology -Patient  regular dialysis day Monday, Wednesday and Friday. -Continue to follow renal service recommendations. -Planning for hemodialysis later today (09/19/2023).  3-anemia of chronic disease -No acute bleeding appreciated -IV iron and Epogen therapy as per nephrology discretion. -Hemoglobin 7.2.  4-generalized weakness -Evaluation by physical therapy appreciated; recommendation for skilled nursing facility at discharge. -Family in agreement to pursued short-term rehabilitation trial and if patient fail to improve considering transition into comfort care and quality management.  5-type 2 diabetes mellitus with nephropathy -A1c 6.5 -No further hypoglycemic event currently appreciated; Long-acting insulin has been discontinued. -Follow CBG fluctuation and continue sliding scale insulin. -Presumed component of bilateral amioatrophic diabetes. -Continue supportive care and pursue rehabilitation.  6-hypertension/SVT -Blood pressure stable -Continue to follow vital sign -Holding Cardizem at the moment.  7-depression/anxiety -Continue treatment with Abilify-Zoloft -As needed trazodone at bedtime.  8-pressure injury -Stage II; see below for details -Continue constant repositioning and preventive measures. -POA   Subjective:  No nausea or vomiting; no fever, no chest pain, patient is expressing no shortness of breath.  Per nursing staff was able to take medications and has been tolerating some clear liquid diet.  Physical Exam: Vitals:   09/19/23 1615 09/19/23 1630 09/19/23 1700 09/19/23 1726  BP: (!) 100/37 (!) 119/56 117/61 (!) 113/54  Pulse: 70 70 72 70  Resp: 16 15 16 18   Temp:      TempSrc:      SpO2:      Weight:      Height:       General exam: Alert, awake, oriented x 3; more interactive and currently no complaining of  any nausea.  Patient is afebrile. Respiratory system: Clear to auscultation. Respiratory effort normal.  Good saturation on room air. Cardiovascular  system:RRR.  No rubs or gallops.  No JVD. Gastrointestinal system: Abdomen is nondistended, soft and nontender.  Positive bowel sounds. Central nervous system: No focal neurological deficits.  Generally weak. Extremities: No cyanosis or clubbing. Skin: No petechiae; multiple bruises appreciated in his extremities.  Stage II sacrum/coccyx and left buttocks, present on admission and without signs of superimposed infection. Psychiatry: Judgement and insight appear normal.  Flat affect.  Latest data Reviewed: Basic metabolic panel: Sodium 133, potassium 4.7, chloride 97, bicarb 24, glucose 82, BUN 52, creatinine 2.7 and GFR 22 CBC: WBCs 9.7, hemoglobin 7.2 and platelet count 107K Ferritin: 1298 Surgical biopsy: Demonstrating fungal elements identified morphologically consistent with Candida esophagitis.   Family Communication: Wife updated at bedside.  Disposition: Status is: Observation The patient remains OBS appropriate and will d/c before 2 midnights.   Planned Discharge Destination: Home  Time spent: 35 minutes  Author: Vassie Loll, MD 09/19/2023 6:27 PM  For on call review [email or web address].

## 2023-09-19 NOTE — Plan of Care (Signed)
  Problem: Health Behavior/Discharge Planning: Goal: Ability to manage health-related needs will improve Outcome: Progressing   Problem: Clinical Measurements: Goal: Will remain free from infection Outcome: Progressing Goal: Diagnostic test results will improve Outcome: Progressing   Problem: Safety: Goal: Ability to remain free from injury will improve Outcome: Progressing   Problem: Health Behavior/Discharge Planning: Goal: Ability to manage health-related needs will improve Outcome: Progressing   Problem: Metabolic: Goal: Ability to maintain appropriate glucose levels will improve Outcome: Progressing   Problem: Nutritional: Goal: Maintenance of adequate nutrition will improve Outcome: Progressing   Problem: Tissue Perfusion: Goal: Adequacy of tissue perfusion will improve Outcome: Progressing

## 2023-09-19 NOTE — Progress Notes (Signed)
   HEMODIALYSIS TREATMENT NOTE:  Uneventful 3.5 hour heparin-free treatment completed using left upper arm AVG (15g/antegrade). Goal met: 800 ml removed.  All blood was returned. Hemostasis was achieved in 15 minutes.  Post-HD:  09/19/23 1745  Vitals  Temp 98 F (36.7 C)  Temp Source Oral  BP (!) 110/58  MAP (mmHg) 70  BP Location Right Arm  BP Method Automatic  Patient Position (if appropriate) Lying  Pulse Rate 72  Pulse Rate Source Monitor  ECG Heart Rate 75  Resp 19  Oxygen Therapy  SpO2 99 %  O2 Device Room Air  Post Treatment  Dialyzer Clearance Lightly streaked  Hemodialysis Intake (mL) 0 mL  Liters Processed 82.9  Fluid Removed (mL) 800 mL  Tolerated HD Treatment Yes  Post-Hemodialysis Comments Goal (0-1L) met  AVG/AVF Arterial Site Held (minutes) 7 minutes  AVG/AVF Venous Site Held (minutes) 7 minutes  Fistula / Graft Left Upper arm   Placement Date/Time: 05/15/21 1140   Placed prior to admission: No  Orientation: Left  Access Location: Upper arm  Access Type: (c)   Expiration Date: 01/16/26  Fistula / Graft Assessment Thrill;Bruit  Status Patent    Arman Filter, RN AP K[facility]

## 2023-09-19 NOTE — TOC Progression Note (Signed)
Transition of Care (TOC) - Progression Note    Patient Details  Name: Mark ROTHLISBERGER Sr. MRN: [id number] Date of Birth: 1941-07-18  Transition of Care [facility]) CM/SW Contact  Villa Herb, [place] Phone Number: 09/19/2023, 12:08 PM  Clinical Narrative:    CSW spoke to Destiny in admissions at [facility] who states they cannot accept pt until Monday. Insurance auth for SNF and EMS has been approved. CSW updated MD of this. TOC to follow.   Expected Discharge Plan: Skilled Nursing Facility Barriers to Discharge: Continued Medical Work up  Expected Discharge Plan and Services In-house Referral: Clinical Social Work Discharge Planning Services: CM Consult Post Acute Care Choice: Skilled Nursing Facility Living arrangements for the past 2 months: Single Family Home                                       Social Determinants of Health (SDOH) Interventions SDOH Screenings   Food Insecurity: No Food Insecurity (09/16/2023)  Housing: Low Risk  (09/16/2023)  Transportation Needs: No Transportation Needs (09/16/2023)  Utilities: Not At Risk (09/16/2023)  Alcohol Screen: Low Risk  (08/22/2022)  Depression (PHQ2-9): High Risk (08/21/2023)  Financial Resource Strain: Low Risk  (08/22/2022)  Physical Activity: Inactive (08/22/2022)  Social Connections: Moderately Integrated (09/16/2023)  Stress: No Stress Concern Present (08/22/2022)  Tobacco Use: Low Risk  (09/17/2023)    Readmission Risk Interventions    05/29/2023    2:05 PM 04/18/2021   12:16 PM 04/16/2021    3:07 PM  Readmission Risk Prevention Plan  Transportation Screening Complete  Complete  HRI or Home Care Consult Complete    Social Work Consult for Recovery Care Planning/Counseling Complete    Palliative Care Screening Not Applicable    Medication Review Oceanographer) Complete  Complete  HRI or Home Care Consult   Complete  SW Recovery Care/Counseling Consult   Complete  Palliative Care Screening   Not Applicable  Skilled  Nursing Facility  Not Applicable Not Complete

## 2023-09-19 NOTE — Plan of Care (Signed)
  Problem: Health Behavior/Discharge Planning: Goal: Ability to manage health-related needs will improve Outcome: Progressing   Problem: Clinical Measurements: Goal: Will remain free from infection Outcome: Progressing Goal: Diagnostic test results will improve Outcome: Progressing   Problem: Safety: Goal: Ability to remain free from injury will improve Outcome: Progressing   Problem: Health Behavior/Discharge Planning: Goal: Ability to manage health-related needs will improve Outcome: Progressing   Problem: Metabolic: Goal: Ability to maintain appropriate glucose levels will improve Outcome: Progressing   Problem: Tissue Perfusion: Goal: Adequacy of tissue perfusion will improve Outcome: Progressing

## 2023-09-19 NOTE — Progress Notes (Signed)
Brief nephrology note  Unfortunately was unable to round on the patient today as he was out of his room getting speech evaluation.  Our plans are for dialysis today and then again on Monday if he remains inpatient.  Notably some findings consistent with candidal esophagitis with treatment in place by GI.  Appreciate help.  Will not plan to round on the patient over the weekend but are available if needed.

## 2023-09-19 NOTE — Telephone Encounter (Signed)
Please arrange hospital follow up with Tobi Bastos or Dr. Levon Hedger only in 3 weeks. He will be discharged to a SNF, they will need to be contacted to help arrange follow up.

## 2023-09-19 NOTE — Inpatient Diabetes Management (Signed)
Inpatient Diabetes Program Recommendations  AACE/ADA: New Consensus Statement on Inpatient Glycemic Control (2015)  Target Ranges:  Prepandial:   less than 140 mg/dL      Peak postprandial:   less than 180 mg/dL (1-2 hours)      Critically ill patients:  140 - 180 mg/dL   Lab Results  Component Value Date   GLUCAP 94 09/19/2023   HGBA1C 6.5 (H) 05/28/2023    Review of Glycemic Control  Latest Reference Range & Units 09/19/23 00:54 09/19/23 07:40 09/19/23 08:13  Glucose-Capillary 70 - 99 mg/dL 97 78 94   Diabetes history: DM 2 Outpatient Diabetes medications:  FSL3, Novolog 8 units tid with meals (2 units tid with meals on dialysis days), Lantus 12 units q HS (6 units on dialysis days) Current orders for Inpatient glycemic control:  Novolog 0-9 units tid with meals and HS  Inpatient Diabetes Program Recommendations:   Note low blood sugar yesterday after receiving 1 unit of insulin.  Consider reducing Novolog correction further to Novolog 0-6 units tid with meals.    Thanks,  Lorenza Cambridge, RN, BC-ADM Inpatient Diabetes Coordinator Pager [phone]  (8a-5p)

## 2023-09-19 NOTE — Evaluation (Addendum)
Modified Barium Swallow Study  Patient Details  Name: Mark BORCHERDING Sr. MRN: [id number] Date of Birth: January 07, 1941  Today's Date: 09/19/2023  Modified Barium Swallow completed.  Full report located under Chart Review in the Imaging Section.  History of Present Illness [name]. is a 83 y.o. male with medical history significant for  ESRD on HD MWF, HTN, HLD, DM II, nephrolithiasis , hearing loss, history of palpitations/SVT presents to ED on 09/16/23--by [facility] from home with c/o weakness and difficulty swallowing for the past couple of days. He has felt like he has been choking on his food and liquid. BSE completed 09/17/23 recommending MBSS   Clinical Impression Pt presents with mild/moderate oropharyngeal dysphagia characterized by oral/pharyngeal weakness, repetitive/disorganized tongue rocking with puree/solid textures, diminished pharyngeal stripping wave, and decreased epiglottic deflection. The decreased strength and coordination result in residue of varying amounts; trace to min residue noted with thin liquids and increased pharyngeal residue noted in moderate amounts with puree and regular textures. Dry repeat swallows are effective in clearing pharyngeal residue. Note occasional flash frank penetration of thin liquids. Barium tablet with brief stasis at the level of the valleculae and silent aspiration of thin liquids when attempting to swallow the barium tablet. Esophageal sweep reveals standing column of barium with some to and fro movement. Suspect esophageal dysmotility. Pt has reported frequent regurgitation and despite the fact that regurgitation was not visualized on this assessment suspect with an entire meal that build up of food/liquid in the esophagus may result in regurgitation. No radiologist present to confirm. Pt also reports frequent nasal regurgitation, however this was not visualized on MBSS. Recommend progress diet according to GI recommendations; from  oropharyngeal standpoint recommend D3/mech soft and thin liquids. Recommend esophageal precautions and meds be administered whole with puree. Pt will benefit from ST f/u at d/c venue to reinforce safe swallowing strategies, esophageal precautions and aspiration precautions. Pt is at increased risk for aspiration given esophageal component of swallowing. Thank you, Factors that may increase risk of adverse event in presence of aspiration [name] & [name] 2021):    Swallow Evaluation Recommendations Recommendations: PO diet PO Diet Recommendation: Dysphagia 3 (Mechanical soft);Thin liquids (Level 0) Liquid Administration via: Cup;Straw Medication Administration: Whole meds with puree Supervision: Patient able to self-feed;Intermittent supervision/cueing for swallowing strategies Swallowing strategies  : Small bites/sips;Multiple dry swallows after each bite/sip Postural changes: Position pt fully upright for meals Oral care recommendations: Oral care BID (2x/day)    [name], CCC-SLP Speech Language Pathologist   Georgetta Haber 09/19/2023,12:26 PM

## 2023-09-20 DIAGNOSIS — Z7189 Other specified counseling: Secondary | ICD-10-CM | POA: Diagnosis not present

## 2023-09-20 DIAGNOSIS — E11649 Type 2 diabetes mellitus with hypoglycemia without coma: Secondary | ICD-10-CM | POA: Diagnosis not present

## 2023-09-20 DIAGNOSIS — Z66 Do not resuscitate: Secondary | ICD-10-CM | POA: Diagnosis not present

## 2023-09-20 DIAGNOSIS — I471 Supraventricular tachycardia, unspecified: Secondary | ICD-10-CM | POA: Diagnosis not present

## 2023-09-20 DIAGNOSIS — D631 Anemia in chronic kidney disease: Secondary | ICD-10-CM | POA: Diagnosis not present

## 2023-09-20 DIAGNOSIS — Z1152 Encounter for screening for COVID-19: Secondary | ICD-10-CM | POA: Diagnosis not present

## 2023-09-20 DIAGNOSIS — K222 Esophageal obstruction: Secondary | ICD-10-CM | POA: Diagnosis not present

## 2023-09-20 DIAGNOSIS — Z794 Long term (current) use of insulin: Secondary | ICD-10-CM | POA: Diagnosis not present

## 2023-09-20 DIAGNOSIS — E1142 Type 2 diabetes mellitus with diabetic polyneuropathy: Secondary | ICD-10-CM | POA: Diagnosis not present

## 2023-09-20 DIAGNOSIS — E1165 Type 2 diabetes mellitus with hyperglycemia: Secondary | ICD-10-CM | POA: Diagnosis not present

## 2023-09-20 DIAGNOSIS — D696 Thrombocytopenia, unspecified: Secondary | ICD-10-CM | POA: Diagnosis not present

## 2023-09-20 DIAGNOSIS — I120 Hypertensive chronic kidney disease with stage 5 chronic kidney disease or end stage renal disease: Secondary | ICD-10-CM | POA: Diagnosis not present

## 2023-09-20 DIAGNOSIS — N186 End stage renal disease: Secondary | ICD-10-CM | POA: Diagnosis not present

## 2023-09-20 DIAGNOSIS — E782 Mixed hyperlipidemia: Secondary | ICD-10-CM | POA: Diagnosis not present

## 2023-09-20 DIAGNOSIS — L89152 Pressure ulcer of sacral region, stage 2: Secondary | ICD-10-CM | POA: Diagnosis not present

## 2023-09-20 DIAGNOSIS — F05 Delirium due to known physiological condition: Secondary | ICD-10-CM | POA: Diagnosis not present

## 2023-09-20 DIAGNOSIS — R627 Adult failure to thrive: Secondary | ICD-10-CM | POA: Diagnosis not present

## 2023-09-20 DIAGNOSIS — R1319 Other dysphagia: Secondary | ICD-10-CM | POA: Diagnosis present

## 2023-09-20 DIAGNOSIS — N2581 Secondary hyperparathyroidism of renal origin: Secondary | ICD-10-CM | POA: Diagnosis not present

## 2023-09-20 DIAGNOSIS — F32A Depression, unspecified: Secondary | ICD-10-CM | POA: Diagnosis not present

## 2023-09-20 DIAGNOSIS — E1122 Type 2 diabetes mellitus with diabetic chronic kidney disease: Secondary | ICD-10-CM | POA: Diagnosis not present

## 2023-09-20 DIAGNOSIS — Z992 Dependence on renal dialysis: Secondary | ICD-10-CM | POA: Diagnosis not present

## 2023-09-20 DIAGNOSIS — Z515 Encounter for palliative care: Secondary | ICD-10-CM | POA: Diagnosis not present

## 2023-09-20 DIAGNOSIS — B3781 Candidal esophagitis: Secondary | ICD-10-CM | POA: Diagnosis not present

## 2023-09-20 DIAGNOSIS — I10 Essential (primary) hypertension: Secondary | ICD-10-CM | POA: Diagnosis not present

## 2023-09-20 DIAGNOSIS — E1169 Type 2 diabetes mellitus with other specified complication: Secondary | ICD-10-CM | POA: Diagnosis not present

## 2023-09-20 LAB — GLUCOSE, CAPILLARY
Glucose-Capillary: 24 mg/dL — CL (ref 70–99)
Glucose-Capillary: 31 mg/dL — CL (ref 70–99)
Glucose-Capillary: 44 mg/dL — CL (ref 70–99)
Glucose-Capillary: 79 mg/dL (ref 70–99)
Glucose-Capillary: 184 mg/dL — ABNORMAL HIGH (ref 70–99)
Glucose-Capillary: 115 mg/dL — ABNORMAL HIGH (ref 70–99)
Glucose-Capillary: 104 mg/dL — ABNORMAL HIGH (ref 70–99)
Glucose-Capillary: 162 mg/dL — ABNORMAL HIGH (ref 70–99)
Glucose-Capillary: 173 mg/dL — ABNORMAL HIGH (ref 70–99)

## 2023-09-20 MED ORDER — ORAL CARE MOUTH RINSE: 15.0000 mL | OROMUCOSAL | Status: DC | PRN

## 2023-09-20 MED ORDER — DEXTROSE 50 % IV SOLN: 25.0000 mL | Freq: Once | INTRAVENOUS | Status: DC

## 2023-09-20 MED ORDER — INSULIN ASPART 100 UNIT/ML IJ SOLN: 0.0000 [IU] | Freq: Three times a day (TID) | INTRAMUSCULAR | Status: DC

## 2023-09-20 MED ORDER — GLUCOSE 40 % PO GEL
2.0000 | ORAL | Status: AC
  Administered 2023-09-20: 62 g via ORAL
  Filled 2023-09-20: qty 2.42

## 2023-09-20 NOTE — Plan of Care (Signed)
  Problem: Health Behavior/Discharge Planning: Goal: Ability to manage health-related needs will improve Outcome: Progressing   Problem: Clinical Measurements: Goal: Will remain free from infection Outcome: Progressing Goal: Diagnostic test results will improve Outcome: Progressing   Problem: Safety: Goal: Ability to remain free from injury will improve Outcome: Progressing   Problem: Health Behavior/Discharge Planning: Goal: Ability to manage health-related needs will improve Outcome: Progressing   Problem: Metabolic: Goal: Ability to maintain appropriate glucose levels will improve Outcome: Progressing   Problem: Nutritional: Goal: Maintenance of adequate nutrition will improve Outcome: Progressing   Problem: Tissue Perfusion: Goal: Adequacy of tissue perfusion will improve Outcome: Progressing

## 2023-09-20 NOTE — Progress Notes (Signed)
Hypoglycemic Event  CBG: 31  Treatment: 8 oz juice/soda Snack 8 oz Dextrose Gel   Symptoms: Shaky  Follow-up CBG: Time:0245 CBG Result:24 Follow-up CBG: Time:0302 CBG Result:44 Follow-up CBG: Time:0315 CBG Result:79 Follow-up CBG: Time:0434 CBG Result:184   Possible Reasons for Event: Unknown  Comments/MD notified:Adefeso aware. MD msg due to D50 once ordered and recheck this am 184    [name] F Khelani Kops

## 2023-09-20 NOTE — Plan of Care (Signed)
Pt is alert and oriented x 4. Can assist with turning. Frequent soft bm tan in color. 3 x during this shift. Vitals stable.  Heparin for dvt prevention. Fistual to left arm. Hemo m-w-f. Received 1 dose of tylenol. Pt had hypoglycemic event during overnight. 31,24,44,79 184. Soda, snack and gel provided.  Problem: Health Behavior/Discharge Planning: Goal: Ability to manage health-related needs will improve Outcome: Progressing   Problem: Clinical Measurements: Goal: Will remain free from infection Outcome: Progressing Goal: Diagnostic test results will improve Outcome: Progressing   Problem: Safety: Goal: Ability to remain free from injury will improve Outcome: Progressing   Problem: Health Behavior/Discharge Planning: Goal: Ability to manage health-related needs will improve Outcome: Progressing   Problem: Metabolic: Goal: Ability to maintain appropriate glucose levels will improve Outcome: Progressing   Problem: Nutritional: Goal: Maintenance of adequate nutrition will improve Outcome: Progressing   Problem: Tissue Perfusion: Goal: Adequacy of tissue perfusion will improve Outcome: Progressing

## 2023-09-20 NOTE — Progress Notes (Addendum)
Progress Note   Patient: [name] NUU:[id number] DOB: September 28, 1940 DOA: 09/16/2023     0 DOS: the patient was seen and examined on 09/20/2023   Brief hospital admission course: Mark DINAPOLI Sr. is a 83 y.o. male with medical history significant for  ESRD on HD MWF, HTN, HLD, DM II, nephrolithiasis , hearing loss, history of palpitations/SVT presents to ED on 09/16/23--by [facility] from home with c/o weakness and difficulty swallowing for the past couple of days. He has felt like he has been choking on his food and liquid.    Last dialysis session was yesterday. Wife reported to EMS that she has seen a gradual decline over the last 6 months, but even more over the last 2 weeks. He is normally able to drive himself to dialysis, but afterwards he can't even walk back inside.    Additional history obtained from wife who is concerns about dysphagia, especially over last 2 weeks with post prandial emesis even with oatmeal and liquids -Emesis is without bile or blood--just ingested gastric contents  No diarrhea No fever  Or chills  No CP,  C/o weakness and fall risk    In ED  CT Head w/o acute findings  CXR w/o acute findings Covid, flu and RSV negative Na-132,  alk phos 323, otherwise LFT are not elevated WBC-10.6, Hgb 7.8 , platelets 106 Serum iron, ferritin, B12 and Folate are Not low  Assessment and Plan: 1-dysphagia/Candida esophagitis -Worsening the last 2 weeks with associated postprandial emesis -Continue as needed analgesics and PPI -Clear liquid diets recommended by speech therapy and GI at the moment. -Follow GI service further recommendation -Endoscopic evaluation with positive Candida esophagitis; treatment with Diflucan has been started -Continue supportive care. -Speech therapy has completed MBS and has recommended dysphagia 3 with thin liquids.  Diet will be advanced accordingly.  2-end-stage renal disease -Appreciate assistance and recommendation by  nephrology -Patient regular dialysis day Monday, Wednesday and Friday. -Continue to follow renal service recommendations. -Plast HD treatment 09/19/2023. -next treatment anticipated on 09/22/23.  3-anemia of chronic disease -No acute bleeding appreciated -IV iron and Epogen therapy as per nephrology discretion. -Hemoglobin 7.2.  4-generalized weakness -Evaluation by physical therapy appreciated; recommendation for skilled nursing facility at discharge. -Family in agreement to pursued short-term rehabilitation trial and if patient fail to improve considering transition into comfort care and quality management.  5-type 2 diabetes mellitus with nephropathy -A1c 6.5 -Patient with another episode of hypoglycemia; Long-acting insulin has already been discontinued; will adjust sliding scale insulin for very sensitive and will try not to cover unless CBGs above 200. -Follow CBG fluctuation and continue sliding scale insulin. -Presumed component of bilateral amioatrophic diabetes. -Continue supportive care and pursue rehabilitation.  6-hypertension/SVT -Blood pressure stable -Continue to follow vital sign -Holding Cardizem at the moment.  7-depression/anxiety -Continue treatment with Abilify-Zoloft -As needed trazodone at bedtime.  8-pressure injury -Stage II; see below for details -Continue constant repositioning and preventive measures. -POA   Subjective:  No nausea, no vomiting, no chest pain, no shortness of breath.  Multiple soft bowel movements overnight and 1 episode of hypoglycemia.  Physical Exam: Vitals:   09/19/23 2034 09/20/23 0440 09/20/23 1116 09/20/23 1345  BP: (!) 121/30 (!) 97/32 (!) 113/41 (!) 107/56  Pulse: 60 82    Resp: 20 18  17   Temp: 97.6 F (36.4 C) 98.7 F (37.1 C)  98.3 F (36.8 C)  TempSrc: Oral   Oral  SpO2: 96% 100%  92%  Weight:  Height:       General exam: Alert, awake, oriented x 3; no fever, no chest pain, no nausea or vomiting.   Nursing staff reporting multiple soft bowel movements. Respiratory system: Clear to auscultation. Respiratory effort normal.  Good saturation on room air. Cardiovascular system:RRR.  No rubs or gallops.  No JVD. Gastrointestinal system: Abdomen is nondistended, soft and nontender. No organomegaly or masses felt. Normal bowel sounds heard. Central nervous system: Generally weak.  No focal neurological deficits. Extremities: No cyanosis or clubbing. Skin: No petechiae; multiple bruises appreciated in his extremities.  Stage II pressure injury in his sacrum/coccyx and left buttocks; present at time of admission and without signs of superimposed infection. Psychiatry: Judgement and insight appear normal.  Flat affect.  Latest data Reviewed: Basic metabolic panel: Sodium 133, potassium 4.7, chloride 97, bicarb 24, glucose 82, BUN 52, creatinine 2.7 and GFR 22 CBC: WBCs 9.7, hemoglobin 7.2 and platelet count 107K Ferritin: 1298 Surgical biopsy: Demonstrating fungal elements identified morphologically consistent with Candida esophagitis.   Family Communication: Wife updated at bedside.  Disposition: Status is: Observation The patient remains OBS appropriate and will d/c before 2 midnights.   Planned Discharge Destination: Home  Time spent: 35 minutes  Author: Vassie Loll, MD 09/20/2023 5:16 PM  For on call review [email or web address].

## 2023-09-20 NOTE — Progress Notes (Signed)
Physical Therapy Treatment Patient Details Name: Mark MEARS Sr. MRN: [id number] DOB: 1941-04-15 Today's Date: 09/20/2023   History of Present Illness 83 y.o. male with a PMH of CAD, hyperlipidemia, hypertension, type 2 diabetes, SVT, and ESRD on HD (n/w/f).     Per wife, patient has been steadily declining over the past 6 months, but he has significantly worse in the last 2 weeks.  Pt unable to walk, stand up even. He has had few falls at home.  Pt also has been having difficulty with swallowing, he will choke and spit upon po intake. Pt has lost weight. Upon eating food patient will choke, spits it out. He has had problems with liquid intake as well.  Dr. Tanya Nones is the PCP.  He put patient on Protonix, with the thought that if he does not improve they can consult him to GI.  No previous history of dysphagia per wife.  Dr. Haynes Dage also suggest that the patient will likely need admission to the hospital for rehab admission.    PT Comments  Patient has difficulty propping up onto elbows to hands due weakness, once seated has tendency to lean backwards while completing BLE exercises requiring verbal cues to keep trunk in midline with fair/good carryover, able to ambulate to bathroom to have a BM and tolerated standing with RW while being cleaned.  Patient tolerated sitting up in chair after therapy with family member present. Patient will benefit from continued skilled physical therapy in hospital and recommended venue below to increase strength, balance, endurance for safe ADLs and gait.      If plan is discharge home, recommend the following: Help with stairs or ramp for entrance;Assistance with cooking/housework;A lot of help with walking and/or transfers;A lot of help with bathing/dressing/bathroom   Can travel by private vehicle     No  Equipment Recommendations  None recommended by PT    Recommendations for Other Services       Precautions / Restrictions Precautions Precautions:  Fall Restrictions Weight Bearing Restrictions Per Provider Order: No     Mobility  Bed Mobility Overal bed mobility: Needs Assistance Bed Mobility: Supine to Sit     Supine to sit: Min assist, Mod assist     General bed mobility comments: increased time, labored movement    Transfers Overall transfer level: Needs assistance Equipment used: Rolling walker (2 wheels) Transfers: Sit to/from Stand, Bed to chair/wheelchair/BSC Sit to Stand: Min assist   Step pivot transfers: Min assist, Mod assist       General transfer comment: unsteady labored movement    Ambulation/Gait Ambulation/Gait assistance: Mod assist Gait Distance (Feet): 15 Feet Assistive device: Rolling walker (2 wheels) Gait Pattern/deviations: Decreased step length - right, Decreased step length - left, Decreased stride length, Ataxic Gait velocity: slow     General Gait Details: slow labored unsteady movement with ataxic like movement, limited mostly due to fatigue, weakness   Stairs             Wheelchair Mobility     Tilt Bed    Modified Rankin (Stroke Patients Only)       Balance Overall balance assessment: Needs assistance Sitting-balance support: Feet supported, No upper extremity supported Sitting balance-Leahy Scale: Fair Sitting balance - Comments: seated at EOB Postural control: Posterior lean Standing balance support: Reliant on assistive device for balance, During functional activity, Bilateral upper extremity supported Standing balance-Leahy Scale: Poor Standing balance comment: using RW  Communication Communication Communication: No apparent difficulties  Cognition Arousal: Alert Behavior During Therapy: WFL for tasks assessed/performed   PT - Cognitive impairments: No apparent impairments                         Following commands: Intact      Cueing Cueing Techniques: Verbal cues  Exercises General Exercises -  Lower Extremity Long Arc Quad: Seated, AROM, Strengthening, Both, 10 reps Hip Flexion/Marching: Seated, AROM, Strengthening, Both, 10 reps Toe Raises: Seated, AROM, Strengthening, Both, 10 reps Heel Raises: Seated, AROM, Strengthening, Both, 10 reps    General Comments        Pertinent Vitals/Pain Pain Assessment Pain Assessment: No/denies pain    Home Living                          Prior Function            PT Goals (current goals can now be found in the care plan section) Acute Rehab PT Goals Patient Stated Goal: to return home after rehab PT Goal Formulation: With patient Time For Goal Achievement: 09/30/23 Potential to Achieve Goals: Good Progress towards PT goals: Progressing toward goals    Frequency    Min 3X/week      PT Plan      Co-evaluation              AM-PAC PT "6 Clicks" Mobility   Outcome Measure  Help needed turning from your back to your side while in a flat bed without using bedrails?: A Little Help needed moving from lying on your back to sitting on the side of a flat bed without using bedrails?: A Lot Help needed moving to and from a bed to a chair (including a wheelchair)?: A Lot Help needed standing up from a chair using your arms (e.g., wheelchair or bedside chair)?: A Lot Help needed to walk in hospital room?: A Lot Help needed climbing 3-5 steps with a railing? : A Lot 6 Click Score: 13    End of Session   Activity Tolerance: Patient tolerated treatment well;Patient limited by fatigue Patient left: in chair;with call bell/phone within reach;with family/visitor present Nurse Communication: Mobility status PT Visit Diagnosis: Unsteadiness on feet (R26.81);Other abnormalities of gait and mobility (R26.89);Muscle weakness (generalized) (M62.81)     Time: 8657-8469 PT Time Calculation (min) (ACUTE ONLY): 28 min  Charges:    $Therapeutic Exercise: 8-22 mins $Therapeutic Activity: 8-22 mins PT General Charges $$  ACUTE PT VISIT: 1 Visit                     2:58 PM, 09/20/23 Ocie Bob, MPT Physical Therapist with [facility] [phone] office [phone] mobile phone

## 2023-09-21 DIAGNOSIS — R1319 Other dysphagia: Secondary | ICD-10-CM | POA: Diagnosis not present

## 2023-09-21 DIAGNOSIS — N186 End stage renal disease: Secondary | ICD-10-CM | POA: Diagnosis not present

## 2023-09-21 DIAGNOSIS — E1142 Type 2 diabetes mellitus with diabetic polyneuropathy: Secondary | ICD-10-CM | POA: Diagnosis not present

## 2023-09-21 DIAGNOSIS — E1165 Type 2 diabetes mellitus with hyperglycemia: Secondary | ICD-10-CM | POA: Diagnosis not present

## 2023-09-21 LAB — GLUCOSE, CAPILLARY
Glucose-Capillary: 114 mg/dL — ABNORMAL HIGH (ref 70–99)
Glucose-Capillary: 127 mg/dL — ABNORMAL HIGH (ref 70–99)
Glucose-Capillary: 137 mg/dL — ABNORMAL HIGH (ref 70–99)
Glucose-Capillary: 81 mg/dL (ref 70–99)

## 2023-09-21 MED ORDER — SODIUM CHLORIDE 0.9 % IV BOLUS
250.0000 mL | Freq: Once | INTRAVENOUS | Status: AC
  Administered 2023-09-21: 250 mL via INTRAVENOUS

## 2023-09-21 NOTE — Progress Notes (Signed)
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 male with medical history significant for  ESRD on HD MWF, HTN, HLD, DM II, nephrolithiasis , hearing loss, history of palpitations/SVT presents to ED on 09/16/23--by [facility] from home with c/o weakness and difficulty swallowing for the past couple of days. He has felt like he has been choking on his food and liquid.    Last dialysis session was yesterday. Wife reported to EMS that she has seen a gradual decline over the last 6 months, but even more over the last 2 weeks. He is normally able to drive himself to dialysis, but afterwards he can't even walk back inside.    Additional history obtained from wife who is concerns about dysphagia, especially over last 2 weeks with post prandial emesis even with oatmeal and liquids -Emesis is without bile or blood--just ingested gastric contents  No diarrhea No fever  Or chills  No CP,  C/o weakness and fall risk    In ED  CT Head w/o acute findings  CXR w/o acute findings Covid, flu and RSV negative Na-132,  alk phos 323, otherwise LFT are not elevated WBC-10.6, Hgb 7.8 , platelets 106 Serum iron, ferritin, B12 and Folate are Not low  Assessment and Plan: 1-dysphagia/Candida esophagitis -Worsening the last 2 weeks with associated postprandial emesis -Continue as needed analgesics and PPI -Clear liquid diets recommended by speech therapy and GI at the moment. -Follow GI service further recommendation -Endoscopic evaluation with positive Candida esophagitis; treatment with Diflucan has been started -Continue supportive care. -Speech therapy has completed MBS and has recommended dysphagia 3 with thin liquids.  Diet will be advanced accordingly.  2-end-stage renal disease -Appreciate assistance and recommendation by  nephrology -Patient regular dialysis day Monday, Wednesday and Friday. -Continue to follow renal service recommendations. -Plast HD treatment 09/19/2023. -next treatment anticipated on 09/22/23.  3-anemia of chronic disease -No acute bleeding appreciated -IV iron and Epogen therapy as per nephrology discretion. -Hemoglobin 7.2.  4-generalized weakness -Evaluation by physical therapy appreciated; recommendation for skilled nursing facility at discharge. -Family in agreement to pursued short-term rehabilitation trial and if patient fail to improve considering transition into comfort care and quality management.  5-type 2 diabetes mellitus with nephropathy -A1c 6.5 -Patient with another episode of hypoglycemia; Long-acting insulin has already been discontinued; will adjust sliding scale insulin for very sensitive and will try not to cover unless CBGs above 200. -Follow CBG fluctuation and continue sliding scale insulin. -Presumed component of bilateral amioatrophic diabetes. -Continue supportive care and pursue rehabilitation.  6-hypertension/SVT -Blood pressure stable -Continue to follow vital sign -Holding Cardizem at the moment.  7-depression/anxiety -Continue treatment with Abilify-Zoloft -As needed trazodone at bedtime.  8-pressure injury -Stage II; see below for details -Continue constant repositioning and preventive measures. -POA  9-hospital-acquired delirium -Minimize sedative agents -Continue goals and orientation and follow clinical response -No acute neurologic deficit appreciated on exam.   Subjective:  No nausea, no vomiting, no chest pain.  Good saturation on room air.  Tolerating dysphagia 3 diet with thin liquids.  Physical Exam: Vitals:   09/20/23 1345 09/20/23 2015 09/21/23 0411 09/21/23 1313  BP: (!) 107/56 97/63 114/65 (!) 95/52  Pulse:  84 92 97  Resp: 17 16 16 17   Temp: 98.3 F (36.8 C) 97.7 F (36.5 C) (!)  97.5 F (36.4 C) 97.7 F (36.5 C)   TempSrc: Oral Oral Oral Oral  SpO2: 92% 96% 100% 95%  Weight:      Height:       General exam: Alert, awake, following commands appropriately and not demonstrating any neurologic deficits.  Overnight confused and mildly disoriented as per nursing staff and patient's wife.  Oriented x 3 at time of my examination. Respiratory system: Clear to auscultation. Respiratory effort normal.  Good saturation on room air. Cardiovascular system:RRR.  No rubs, no gallops, no JVD on exam. Gastrointestinal system: Abdomen is nondistended, soft and nontender. No organomegaly or masses felt. Normal bowel sounds heard. Central nervous system: Generally weak.  No focal neurological deficits. Extremities: No cyanosis or clubbing. Skin: No petechiae. Psychiatry: Flat affect appreciated on exam.  Following commands appropriately and demonstrating to be oriented x 3.  Latest data Reviewed: Basic metabolic panel: Sodium 133, potassium 4.7, chloride 97, bicarb 24, glucose 82, BUN 52, creatinine 2.7 and GFR 22 CBC: WBCs 9.7, hemoglobin 7.2 and platelet count 107K Ferritin: 1298 Surgical biopsy: Demonstrating fungal elements identified morphologically consistent with Candida esophagitis.   Family Communication: Wife updated at bedside.  Disposition: Status is: Observation The patient remains OBS appropriate and will d/c before 2 midnights.   Planned Discharge Destination: Home  Time spent: 35 minutes  Author: Vassie Loll, MD 09/21/2023 5:55 PM  For on call review [email or web address].

## 2023-09-21 NOTE — Progress Notes (Signed)
Pt is confused and hallucinating. Pt said there are  "giraffes and airplanes outside his window."  And is talking to the wall. Pt wife reported that the pt told her he talked to his deceased ex-wife this morning. MD made aware and seen pt at bedside.

## 2023-09-21 NOTE — Plan of Care (Signed)
  Problem: Health Behavior/Discharge Planning: Goal: Ability to manage health-related needs will improve Outcome: Progressing   Problem: Clinical Measurements: Goal: Will remain free from infection Outcome: Progressing Goal: Diagnostic test results will improve Outcome: Progressing   Problem: Safety: Goal: Ability to remain free from injury will improve Outcome: Progressing   Problem: Health Behavior/Discharge Planning: Goal: Ability to manage health-related needs will improve Outcome: Progressing   Problem: Metabolic: Goal: Ability to maintain appropriate glucose levels will improve Outcome: Progressing   Problem: Nutritional: Goal: Maintenance of adequate nutrition will improve Outcome: Progressing   Problem: Tissue Perfusion: Goal: Adequacy of tissue perfusion will improve Outcome: Progressing

## 2023-09-21 NOTE — Progress Notes (Addendum)
pt is confused this morning and is hallucinating. But easily reoriented. MD made aware

## 2023-09-21 NOTE — Progress Notes (Signed)
Contacted Charge Nurse and on-call physician Pts  BP: 80/32. New order for NS 250 Bolus.  After bolus given, BP: 104/66. Physician notified. Pt is experiencing hallucinations and delusions, speaking to someone in the room that isn't there. Pt spoke with wife, was disoriented speaking with her. Monitoring pt close for any further changes with BP dropping and further decline.

## 2023-09-22 DIAGNOSIS — R1319 Other dysphagia: Secondary | ICD-10-CM | POA: Diagnosis not present

## 2023-09-22 DIAGNOSIS — Z7189 Other specified counseling: Secondary | ICD-10-CM | POA: Diagnosis not present

## 2023-09-22 DIAGNOSIS — E1142 Type 2 diabetes mellitus with diabetic polyneuropathy: Secondary | ICD-10-CM | POA: Diagnosis not present

## 2023-09-22 DIAGNOSIS — N186 End stage renal disease: Secondary | ICD-10-CM | POA: Diagnosis not present

## 2023-09-22 DIAGNOSIS — E1165 Type 2 diabetes mellitus with hyperglycemia: Secondary | ICD-10-CM | POA: Diagnosis not present

## 2023-09-22 DIAGNOSIS — Z515 Encounter for palliative care: Secondary | ICD-10-CM | POA: Diagnosis not present

## 2023-09-22 LAB — RENAL FUNCTION PANEL
Albumin: 1.7 g/dL — ABNORMAL LOW (ref 3.5–5.0)
Anion gap: 11 (ref 5–15)
BUN: 26 mg/dL — ABNORMAL HIGH (ref 8–23)
CO2: 24 mmol/L (ref 22–32)
Calcium: 7.4 mg/dL — ABNORMAL LOW (ref 8.9–10.3)
Chloride: 96 mmol/L — ABNORMAL LOW (ref 98–111)
Creatinine, Ser: 3.72 mg/dL — ABNORMAL HIGH (ref 0.61–1.24)
GFR, Estimated: 16 mL/min — ABNORMAL LOW (ref >60)
Glucose, Bld: 88 mg/dL (ref 70–99)
Phosphorus: 4 mg/dL (ref 2.5–4.6)
Potassium: 3.3 mmol/L — ABNORMAL LOW (ref 3.5–5.1)
Sodium: 131 mmol/L — ABNORMAL LOW (ref 135–145)

## 2023-09-22 LAB — GLUCOSE, CAPILLARY
Glucose-Capillary: 103 mg/dL — ABNORMAL HIGH (ref 70–99)
Glucose-Capillary: 100 mg/dL — ABNORMAL HIGH (ref 70–99)
Glucose-Capillary: 58 mg/dL — ABNORMAL LOW (ref 70–99)
Glucose-Capillary: 88 mg/dL (ref 70–99)
Glucose-Capillary: 97 mg/dL (ref 70–99)
Glucose-Capillary: 86 mg/dL (ref 70–99)
Glucose-Capillary: 68 mg/dL — ABNORMAL LOW (ref 70–99)

## 2023-09-22 LAB — CBC
HCT: 25.3 % — ABNORMAL LOW (ref 39.0–52.0)
Hemoglobin: 7.7 g/dL — ABNORMAL LOW (ref 13.0–17.0)
MCH: 32.1 pg (ref 26.0–34.0)
MCHC: 30.4 g/dL (ref 30.0–36.0)
MCV: 105.4 fL — ABNORMAL HIGH (ref 80.0–100.0)
Platelets: 132 10*3/uL — ABNORMAL LOW (ref 150–400)
RBC: 2.4 MIL/uL — ABNORMAL LOW (ref 4.22–5.81)
RDW: 14.8 % (ref 11.5–15.5)
WBC: 8.5 10*3/uL (ref 4.0–10.5)
nRBC: 0 % (ref 0.0–0.2)

## 2023-09-22 MED ORDER — FENTANYL CITRATE PF 50 MCG/ML IJ SOSY: 25.0000 ug | PREFILLED_SYRINGE | INTRAMUSCULAR | Status: DC | PRN

## 2023-09-22 MED ORDER — GLYCOPYRROLATE 1 MG PO TABS: 1.0000 mg | ORAL_TABLET | ORAL | Status: DC | PRN

## 2023-09-22 MED ORDER — HALOPERIDOL LACTATE 2 MG/ML PO CONC: 0.5000 mg | ORAL | Status: DC | PRN

## 2023-09-22 MED ORDER — GLYCOPYRROLATE 0.2 MG/ML IJ SOLN: 0.2000 mg | INTRAMUSCULAR | Status: DC | PRN

## 2023-09-22 MED ORDER — ONDANSETRON 4 MG PO TBDP: 4.0000 mg | ORAL_TABLET | Freq: Four times a day (QID) | ORAL | Status: DC | PRN

## 2023-09-22 MED ORDER — BIOTENE DRY MOUTH MT LIQD: 15.0000 mL | OROMUCOSAL | Status: DC | PRN

## 2023-09-22 MED ORDER — HALOPERIDOL 0.5 MG PO TABS: 0.5000 mg | ORAL_TABLET | ORAL | Status: DC | PRN

## 2023-09-22 MED ORDER — ONDANSETRON HCL 4 MG/2ML IJ SOLN: 4.0000 mg | Freq: Four times a day (QID) | INTRAMUSCULAR | Status: DC | PRN

## 2023-09-22 MED ORDER — HALOPERIDOL LACTATE 5 MG/ML IJ SOLN: 0.5000 mg | INTRAMUSCULAR | Status: DC | PRN

## 2023-09-22 MED ORDER — POLYVINYL ALCOHOL 1.4 % OP SOLN: 1.0000 [drp] | Freq: Four times a day (QID) | OPHTHALMIC | Status: DC | PRN

## 2023-09-22 MED ORDER — LORAZEPAM 2 MG/ML IJ SOLN: 1.0000 mg | INTRAMUSCULAR | Status: DC | PRN

## 2023-09-22 NOTE — Progress Notes (Signed)
Patient is now comfort care, DNR bracelet placed on wrist , patient is resting with eyes closed, respirations even and nonlabored at this time, patient still not verbally responsive. Family visited earlier and patient would open eyes at times to look at family . Repositioned every 2 hours for comfort.

## 2023-09-22 NOTE — Plan of Care (Signed)
   Problem: Health Behavior/Discharge Planning: Goal: Ability to manage health-related needs will improve Outcome: Progressing

## 2023-09-22 NOTE — Progress Notes (Signed)
Patient was talking a little earlier with NT. However when this writer came back in room patients eyes were rolling in the back of his head and he is only responsive to pain . He will open his eyes and then they immediately go rolling back in his head and patient is asleep . Dr. Gwenlyn Perking notified of change and Charge RN Chales Abrahams at bedside.      09/22/23 0800  Assess: MEWS Score  Temp 97.9 F (36.6 C)  BP 136/65  Pulse Rate 96  Resp (!) 21  Level of Consciousness Responds to Pain  SpO2 100 %  O2 Device Room Air  Assess: MEWS Score  MEWS Temp 0  MEWS Systolic 0  MEWS Pulse 0  MEWS RR 1  MEWS LOC 2  MEWS Score 3  MEWS Score Color Yellow  Assess: if the MEWS score is Yellow or Red  Were vital signs accurate and taken at a resting state? Yes  Does the patient meet 2 or more of the SIRS criteria? No  MEWS guidelines implemented  Yes, yellow  Treat  MEWS Interventions Considered administering scheduled or prn medications/treatments as ordered  Take Vital Signs  Increase Vital Sign Frequency  Yellow: Q2hr x1, continue Q4hrs until patient remains green for 12hrs  Escalate  MEWS: Escalate Yellow: Discuss with charge nurse and consider notifying provider and/or RRT  Notify: Charge Nurse/RN  Name of Charge Nurse/RN Notified Chales Abrahams, RN  Provider Notification  Provider Name/Title Dr. Gwenlyn Perking  Date Provider Notified 09/22/23  Time Provider Notified [phone]  Method of Notification Page  Notification Reason Change in status;Other (Comment)  Provider response En route  Assess: SIRS CRITERIA  SIRS Temperature  0  SIRS Respirations  1  SIRS Pulse 1  SIRS WBC 0  SIRS Score Sum  2

## 2023-09-22 NOTE — Progress Notes (Signed)
  HEMODIALYSIS TREATMENT NOTE:  Pre-HD: Lying in bed, mouth agape, eyes closed.  Opens rolled eyes to painful stimuli.  Unable to make eye contact.  BP 128/56, HR 90s / Afib, R 20, spO2 98% on RA.  Also assessed by Dr. Arrie Aran pre-HD.  3.5 hour treatment completed using left upper arm AVG (15g/antegrade). UF was limited by soft BP.  Pt was restless for some of session.  Would briefly open eyes but not make eye contact before eyes rolled back in his head.  Majority of treatment with eyes closed, mouth agape. No UF.  One episode of bowel incontinence.  All blood was returned.  Hemostasis was achieved in 20 minutes.  Post-HD:  09/22/23 1345  Vitals  Temp (!) 97.4 F (36.3 C)  Temp Source Axillary  BP (!) 113/58  MAP (mmHg) 74  BP Location Right Arm  BP Method Automatic  Patient Position (if appropriate) Lying  Pulse Rate 91  Pulse Rate Source Monitor  ECG Heart Rate 90  Resp 12  Oxygen Therapy  SpO2 97 %  O2 Device Room Air  Post Treatment  Dialyzer Clearance Heavily streaked  Hemodialysis Intake (mL) 200 mL  Liters Processed 72.3  Fluid Removed (mL) 0 mL  Tolerated HD Treatment No (Comment)  Post-Hemodialysis Comments No UF.  Remains unresponsive except to painful stimuli  AVG/AVF Arterial Site Held (minutes) 10 minutes  AVG/AVF Venous Site Held (minutes) 10 minutes  Fistula / Graft Left Upper arm   Placement Date/Time: 05/15/21 1140   Placed prior to admission: No  Orientation: Left  Access Location: Upper arm  Access Type: (c)   Expiration Date: 01/16/26  Fistula / Graft Assessment Thrill;Bruit  Status Patent    Arman Filter, RN AP [facility]

## 2023-09-22 NOTE — Progress Notes (Addendum)
Patient ID: Mark Bleak., male   DOB: 03-18-1941, 83 y.o.   MRN: [id number] S: Pt is currently unresponsive.  He will open his eyes but no meaningful movement and is nonverbal.  This has developed since this morning. O:BP 136/65   Pulse 96   Temp 97.9 F (36.6 C) (Oral)   Resp (!) 21   Ht 5\' 8"  (1.727 m)   Wt 61.8 kg   SpO2 100%   BMI 20.72 kg/m   Intake/Output Summary (Last 24 hours) at 09/22/2023 9147 Last data filed at 09/21/2023 2258 Gross per 24 hour  Intake 483 ml  Output --  Net 483 ml   Intake/Output: I/O last 3 completed shifts: In: 963 [P.O.:960; I.V.:3] Out: -   Intake/Output this shift:  No intake/output data recorded. Weight change:  Gen: unresponsive CVS: RRR Resp: CTA Abd: +BS< soft, NT/ND Ext: no edema, LUE AVG +T/B  Recent Labs  Lab 09/16/23 0957 09/17/23 0455 09/19/23 1441  NA 133* 133* 129*  K 4.4 4.7 3.5  CL 96* 97* 95*  CO2 26 24 23   GLUCOSE 152* 82 190*  BUN 35* 52* 37*  CREATININE 2.21* 2.77* 3.45*  ALBUMIN 1.6*  --  1.6*  CALCIUM 7.7* 7.0* 6.8*  PHOS 4.4  --  3.8  AST 35  --   --   ALT 34  --   --    Liver Function Tests: Recent Labs  Lab 09/16/23 0957 09/19/23 1441  AST 35  --   ALT 34  --   ALKPHOS 323*  --   BILITOT 0.7  --   PROT 4.5*  --   ALBUMIN 1.6* 1.6*   No results for input(s): "LIPASE", "AMYLASE" in the last 168 hours. No results for input(s): "AMMONIA" in the last 168 hours. CBC: Recent Labs  Lab 09/16/23 0957 09/17/23 0455 09/19/23 1441  WBC 10.6* 9.7 9.6  NEUTROABS 9.2*  --   --   HGB 7.8* 7.2* 7.1*  HCT 25.4* 22.9* 22.5*  MCV 109.0* 106.0* 104.7*  PLT 106* 107* 107*   Cardiac Enzymes: Recent Labs  Lab 09/16/23 0952  CKTOTAL 70   CBG: Recent Labs  Lab 09/21/23 1614 09/22/23 0223 09/22/23 0734 09/22/23 0820 09/22/23 0849  GLUCAP 137* 103* 58* 100* 88    Iron Studies: No results for input(s): "IRON", "TIBC", "TRANSFERRIN", "FERRITIN" in the last 72 hours. Studies/Results: No  results found.  ARIPiprazole  2 mg Oral QHS   calcitRIOL  1 mcg Oral Q M,W,F   Chlorhexidine Gluconate Cloth  6 each Topical Q0600   darbepoetin (ARANESP) injection - DIALYSIS  150 mcg Subcutaneous Q Wed-1800   dextrose  25 mL Intravenous Once   diltiazem  240 mg Oral Daily   fluconazole  200 mg Oral Q M,W,F-1800   heparin  5,000 Units Subcutaneous Q8H   pantoprazole (PROTONIX) IV  40 mg Intravenous Q12H   sertraline  50 mg Oral Daily   sodium chloride flush  3 mL Intravenous Q12H   sodium chloride flush  3 mL Intravenous Q12H    BMET    Component Value Date/Time   NA 129 (L) 09/19/2023 1441   K 3.5 09/19/2023 1441   CL 95 (L) 09/19/2023 1441   CO2 23 09/19/2023 1441   GLUCOSE 190 (H) 09/19/2023 1441   BUN 37 (H) 09/19/2023 1441   CREATININE 3.45 (H) 09/19/2023 1441   CREATININE 4.24 (H) 02/11/2023 0843   CALCIUM 6.8 (L) 09/19/2023 1441   GFRNONAA 17 (L)  09/19/2023 1441   GFRNONAA 36 (L) 12/30/2019 1143   GFRAA 42 (L) 12/30/2019 1143   CBC    Component Value Date/Time   WBC 9.6 09/19/2023 1441   RBC 2.15 (L) 09/19/2023 1441   HGB 7.1 (L) 09/19/2023 1441   HCT 22.5 (L) 09/19/2023 1441   PLT 107 (L) 09/19/2023 1441   MCV 104.7 (H) 09/19/2023 1441   MCH 33.0 09/19/2023 1441   MCHC 31.6 09/19/2023 1441   RDW 14.8 09/19/2023 1441   LYMPHSABS 0.7 09/16/2023 0957   MONOABS 0.6 09/16/2023 0957   EOSABS 0.1 09/16/2023 0957   BASOSABS 0.0 09/16/2023 0957    Outpatient dialysis unit: [facility] Outpatient dialysis prescription: F180, Bfr 400, 3K, 3 Ca, 4hrs, EDW 63kg, LUE AVG, , mircera on 1/29, calcitriol   Assessment/Recommendations: Mark L Phenix Sr. is a/an 83 y.o. male with a past medical history notable for ESRD on HD admitted with FTT and dysphagia   # Altered mental status:  Pt is now unresponsive.  He is not stable for discharge to SNF at this time.  He will need CT of head and/or MRI and neurology evaluation.  Per Dr. Gwenlyn Perking, he wants to see his  response after HD.  His glucose did drop to 58 this morning but improved to 100.  Continue to monitor.  Would give 1 amp of D50.  He may require D10 drip if glucose continues to drop.  # ESRD: Continue dialysis on MWF schedule and follow his mental status after HD.   # Volume/ hypertension: Weight loss over the last 6 months. Appears near euvolemic. Re-eval EDW during hospitalization. On dilt, watch for low BPs   # Anemia of Chronic Kidney Disease: Hemoglobin 7s. Aranesp given 2/12. Iron replete.   # Secondary Hyperparathyroidism/Hyperphosphatemia: Continue home calcitriol.  Phosphorus normal.  Hold home Phos first binders for now   # Vascular access: AVG with no issues   # Dysphagia: EGD with diffuse narrowing and had dilation. Symptoms persist. Defer to primary and GI.   # Failure to thrive: Concerning trajectory over the past 6 months.  Recommend palliative care   # Additional recommendations: - Dose all meds for creatinine clearance < 10 ml/min  - Unless absolutely necessary, no MRIs with gadolinium.  - Implement save arm precautions.  Prefer needle sticks in the dorsum of the hands or wrists.  No blood pressure measurements in arm. - If blood transfusion is requested during hemodialysis sessions, please alert [place] prior to the session.  - Use synthetic opioids (Fentanyl/Dilaudid) if needed  Irena Cords, MD [facility] [phone]

## 2023-09-22 NOTE — Progress Notes (Signed)
Patients CBG 58 this morning, given orange juice and coke orally, notified Dr. Gwenlyn Perking and will recheck CBG .

## 2023-09-22 NOTE — Progress Notes (Signed)
Palliative:   [name] is seen in the HD suite.  He appears acutely/chronically ill and very frail.  He does not interact with me in any meaningful way.  He clearly cannot make his basic needs known.  There is no family at bedside at this time.  Face-to-face discussions with bedside nursing staff and HD RN related to patient condition, needs.  Wife of 13 years, [name], seen in patient room 306.  We get daughter/HCPOA, [name], on the telephone.  We talk about [name]'s acute and chronic health concerns and his decline over the last few days.  [name] states that [name] had been quoting Bible versus and seeing deceased loved ones.  Karie Fetch agree for DNR.  Orders adjusted goldenrod form completed and placed on chart.  Secure Chat from bedside nursing staff who shares family is present. I returned to the room to find [name] resting quietly in bed.  He continues to have diminished capacity.  HCPOA, daughter [name] and wife of 13 years, [name], shared they are ready to focus on comfort and dignity at end-of-life, they request residential hospice placement.  Provider choice offered, they choose [facility].  We talked about what is and is not provided for residential hospice including but not limited to, stopping any painful procedures, stopping HD, stopping IV fluids and other life-prolonging treatments.  We talked about increasing medicines for pain, anxiety, breathlessness.  Conference with attending, bedside nursing staff, transition of care team, HD RN related to patient condition, needs, goals of care, disposition.  Plan: Requesting comfort and dignity at end-of-life, residential hospice at [facility] house (provider choice offered).  End-of-life order set implemented.  No morphine for ESRD. Prognosis: Discussed with permission.  Anticipate days, less than 2 weeks based on chronic illness burden and family's desire to focus on comfort and dignity, let nature take its course.  65  minutes, extended time Lillia Carmel, NP Palliative medicine team Team phone [phone]

## 2023-09-22 NOTE — Progress Notes (Signed)
Progress Note   Patient: [name] UJW:[id number] DOB: [birth year] DOA: 09/16/2023     2 DOS: the patient was seen and examined on 09/22/2023   Brief hospital admission course: TRACE WIRICK Sr. is a 83 y.o. male with medical history significant for  ESRD on HD MWF, HTN, HLD, DM II, nephrolithiasis , hearing loss, history of palpitations/SVT presents to ED on 09/16/23--by [facility] from home with c/o weakness and difficulty swallowing for the past couple of days. He has felt like he has been choking on his food and liquid.    Last dialysis session was yesterday. Wife reported to EMS that she has seen a gradual decline over the last 6 months, but even more over the last 2 weeks. He is normally able to drive himself to dialysis, but afterwards he can't even walk back inside.    Additional history obtained from wife who is concerns about dysphagia, especially over last 2 weeks with post prandial emesis even with oatmeal and liquids -Emesis is without bile or blood--just ingested gastric contents  No diarrhea No fever  Or chills  No CP,  C/o weakness and fall risk    In ED  CT Head w/o acute findings  CXR w/o acute findings Covid, flu and RSV negative Na-132,  alk phos 323, otherwise LFT are not elevated WBC-10.6, Hgb 7.8 , platelets 106 Serum iron, ferritin, B12 and Folate are Not low  Assessment and Plan: 1-dysphagia/Candida esophagitis -Worsening the last 2 weeks with associated postprandial emesis -Continue as needed analgesics and PPI -Clear liquid diets recommended by speech therapy and GI at the moment. -Follow GI service further recommendation -Endoscopic evaluation with positive Candida esophagitis; treatment with Diflucan has been started -Continue supportive care. -Speech therapy has completed MBS and has recommended dysphagia 3 with thin liquids. -Poor oral intake and further declining appreciated; after discussion with palliative care and family transitioning to  comfort care and symptomatic management. -Comfort feeding will be offered.  2-end-stage renal disease -Appreciate assistance and recommendation by nephrology -Patient regular dialysis day Monday, Wednesday and Friday. -Continue to follow renal service recommendations. -Plan for short hemodialysis later today; this will be patient last hemodialysis as well transitioning into comfort care and symptomatic management only. -Continue supportive care and follow response.  3-anemia of chronic disease -No acute bleeding appreciated -IV iron and Epogen therapy as per nephrology discretion. -Hemoglobin 7.2. -Will transition care to comfort.  4-generalized weakness -Evaluation by physical therapy appreciated; recommendation for skilled nursing facility at discharge. -Family in agreement to pursued short-term rehabilitation trial and if patient fail to improve considering transition into comfort care and quality management. -At this moment patient condition has continued to decline and further discussion has elicited decision for comfort care and symptomatic management only. -Patient is a candidate for residential hospice.  5-type 2 diabetes mellitus with nephropathy -A1c 6.5 -Patient with another episode of hypoglycemia; Long-acting insulin has already been discontinued; will adjust sliding scale insulin for very sensitive and will try not to cover unless CBGs above 200. -No further CBGs check or insulin therapy -Plan is for comfort care and symptomatic management only.  6-hypertension/SVT -Blood pressure stable -Continue to do home medications and focus on comfort care only.  7-depression/anxiety -Continue treatment with Abilify-Zoloft as much as patient able to take it. -Transition to full comfort care and will follow symptomatic management.  8-pressure injury -Stage II; see below for details -Continue constant repositioning and preventive measures. -POA  9-hospital-acquired  delirium/somnolence -Very somnolent and demonstrating  difficulty following commands. -No acute neurologic deficit appreciated; moving 4 limbs spontaneously. -After further discussing with palliative care service, family and healthcare power of attorney decision has been made to pursued comfort care and symptomatic management only.   Subjective:  Hypoglycemic overnight; no chest pain, no nausea, no vomiting.  Poor oral intake.  Very somnolent and not following commands.  Physical Exam: Vitals:   09/22/23 1300 09/22/23 1330 09/22/23 1345 09/22/23 1400  BP: (!) 120/50 (!) 145/72 (!) 113/58 (!) 130/47  Pulse:   91 (!) 104  Resp: 17 18 12 17   Temp:   (!) 97.4 F (36.3 C) 98 F (36.7 C)  TempSrc:   Axillary Axillary  SpO2: 99%  97% 95%  Weight:   61.2 kg   Height:       General exam: Very somnolent and having trouble waking up today.  No nausea, no vomiting.  Mild hypoglycemic event appreciated overnight.  Poor oral intake. Respiratory system: Good saturation on room air. Cardiovascular system: Rate controlled, no rubs, no gallops, no JVD on exam.  Positive soft murmur Gastrointestinal system: Abdomen is nondistended, soft and nontender. No organomegaly or masses felt. Normal bowel sounds heard. Central nervous system: Generally weak; moving 4 limbs.  Limited examination. Extremities: No cyanosis or clubbing. Skin: No petechiae.  Stage II pressure injuries present at time of admission without signs of superimposed infection. Psychiatry: Limited examination; patient very somnolent and not following commands currently.  Latest data Reviewed: Basic metabolic panel: Sodium 133, potassium 4.7, chloride 97, bicarb 24, glucose 82, BUN 52, creatinine 2.7 and GFR 22 CBC: WBCs 9.7, hemoglobin 7.2 and platelet count 107K Ferritin: 1298 Surgical biopsy: Demonstrating fungal elements identified morphologically consistent with Candida esophagitis.   Family Communication: Wife updated at  bedside.  Disposition: Status is: Observation The patient remains OBS appropriate and will d/c before 2 midnights.   Planned Discharge Destination: Home  Time spent: 35 minutes  Author: Vassie Loll, MD 09/22/2023 4:29 PM  For on call review [email or web address].

## 2023-09-22 NOTE — TOC Progression Note (Signed)
Transition of Care (TOC) - Progression Note    Patient Details  Name: Mark PAVLAK Sr. MRN: [id number] Date of Birth: 1940/11/10  Transition of Care [facility]) CM/SW Contact  Villa Herb, [place] Phone Number: 09/22/2023, 3:48 PM  Clinical Narrative:    CSW updated that family has decided on comfort care for pt with plan for residential hospice with [facility]. CSW spoke to [place] with hospice who states she will work on referral and try to have a nurse out tomorrow to assess pt for [employer]. TOC to follow.   Expected Discharge Plan: Skilled Nursing Facility Barriers to Discharge: Continued Medical Work up  Expected Discharge Plan and Services In-house Referral: Clinical Social Work Discharge Planning Services: CM Consult Post Acute Care Choice: Skilled Nursing Facility Living arrangements for the past 2 months: Single Family Home                                       Social Determinants of Health (SDOH) Interventions SDOH Screenings   Food Insecurity: No Food Insecurity (09/16/2023)  Housing: Low Risk  (09/16/2023)  Transportation Needs: No Transportation Needs (09/16/2023)  Utilities: Not At Risk (09/16/2023)  Alcohol Screen: Low Risk  (08/22/2022)  Depression (PHQ2-9): High Risk (08/21/2023)  Financial Resource Strain: Low Risk  (08/22/2022)  Physical Activity: Inactive (08/22/2022)  Social Connections: Moderately Integrated (09/16/2023)  Stress: No Stress Concern Present (08/22/2022)  Tobacco Use: Low Risk  (09/17/2023)    Readmission Risk Interventions    05/29/2023    2:05 PM 04/18/2021   12:16 PM 04/16/2021    3:07 PM  Readmission Risk Prevention Plan  Transportation Screening Complete  Complete  HRI or Home Care Consult Complete    Social Work Consult for Recovery Care Planning/Counseling Complete    Palliative Care Screening Not Applicable    Medication Review Oceanographer) Complete  Complete  HRI or Home Care Consult    Complete  SW Recovery Care/Counseling Consult   Complete  Palliative Care Screening   Not Applicable  Skilled Nursing Facility  Not Applicable Not Complete

## 2023-09-23 ENCOUNTER — Inpatient Hospital Stay (HOSPITAL_COMMUNITY)
Admission: EM | Admit: 2023-09-23 | Discharge: 2023-09-26 | DRG: 951 | Disposition: A | Discharge status: Hospice | Payer: PPO | Source: Hospice | Attending: Internal Medicine | Admitting: Internal Medicine

## 2023-09-23 DIAGNOSIS — Z833 Family history of diabetes mellitus: Secondary | ICD-10-CM

## 2023-09-23 DIAGNOSIS — Z8249 Family history of ischemic heart disease and other diseases of the circulatory system: Secondary | ICD-10-CM

## 2023-09-23 DIAGNOSIS — F32A Depression, unspecified: Secondary | ICD-10-CM | POA: Diagnosis not present

## 2023-09-23 DIAGNOSIS — Z7189 Other specified counseling: Secondary | ICD-10-CM | POA: Diagnosis not present

## 2023-09-23 DIAGNOSIS — Z794 Long term (current) use of insulin: Secondary | ICD-10-CM | POA: Diagnosis not present

## 2023-09-23 DIAGNOSIS — H9191 Unspecified hearing loss, right ear: Secondary | ICD-10-CM | POA: Diagnosis present

## 2023-09-23 DIAGNOSIS — E782 Mixed hyperlipidemia: Secondary | ICD-10-CM | POA: Diagnosis present

## 2023-09-23 DIAGNOSIS — I2510 Atherosclerotic heart disease of native coronary artery without angina pectoris: Secondary | ICD-10-CM | POA: Diagnosis present

## 2023-09-23 DIAGNOSIS — B3781 Candidal esophagitis: Secondary | ICD-10-CM | POA: Diagnosis not present

## 2023-09-23 DIAGNOSIS — K760 Fatty (change of) liver, not elsewhere classified: Secondary | ICD-10-CM | POA: Diagnosis present

## 2023-09-23 DIAGNOSIS — E11311 Type 2 diabetes mellitus with unspecified diabetic retinopathy with macular edema: Secondary | ICD-10-CM | POA: Diagnosis present

## 2023-09-23 DIAGNOSIS — I120 Hypertensive chronic kidney disease with stage 5 chronic kidney disease or end stage renal disease: Secondary | ICD-10-CM | POA: Diagnosis not present

## 2023-09-23 DIAGNOSIS — F419 Anxiety disorder, unspecified: Secondary | ICD-10-CM | POA: Diagnosis not present

## 2023-09-23 DIAGNOSIS — E1169 Type 2 diabetes mellitus with other specified complication: Secondary | ICD-10-CM | POA: Diagnosis not present

## 2023-09-23 DIAGNOSIS — D631 Anemia in chronic kidney disease: Secondary | ICD-10-CM | POA: Diagnosis not present

## 2023-09-23 DIAGNOSIS — G8929 Other chronic pain: Secondary | ICD-10-CM | POA: Diagnosis present

## 2023-09-23 DIAGNOSIS — N186 End stage renal disease: Secondary | ICD-10-CM | POA: Diagnosis present

## 2023-09-23 DIAGNOSIS — Z8546 Personal history of malignant neoplasm of prostate: Secondary | ICD-10-CM | POA: Diagnosis not present

## 2023-09-23 DIAGNOSIS — I471 Supraventricular tachycardia, unspecified: Secondary | ICD-10-CM | POA: Diagnosis present

## 2023-09-23 DIAGNOSIS — R531 Weakness: Secondary | ICD-10-CM

## 2023-09-23 DIAGNOSIS — L89152 Pressure ulcer of sacral region, stage 2: Secondary | ICD-10-CM | POA: Diagnosis present

## 2023-09-23 DIAGNOSIS — M549 Dorsalgia, unspecified: Secondary | ICD-10-CM | POA: Diagnosis present

## 2023-09-23 DIAGNOSIS — Z888 Allergy status to other drugs, medicaments and biological substances status: Secondary | ICD-10-CM

## 2023-09-23 DIAGNOSIS — R1319 Other dysphagia: Secondary | ICD-10-CM | POA: Diagnosis not present

## 2023-09-23 DIAGNOSIS — F05 Delirium due to known physiological condition: Secondary | ICD-10-CM | POA: Diagnosis not present

## 2023-09-23 DIAGNOSIS — Z992 Dependence on renal dialysis: Secondary | ICD-10-CM

## 2023-09-23 DIAGNOSIS — E1122 Type 2 diabetes mellitus with diabetic chronic kidney disease: Secondary | ICD-10-CM | POA: Diagnosis present

## 2023-09-23 DIAGNOSIS — Z66 Do not resuscitate: Secondary | ICD-10-CM | POA: Diagnosis present

## 2023-09-23 DIAGNOSIS — R54 Age-related physical debility: Secondary | ICD-10-CM | POA: Diagnosis present

## 2023-09-23 DIAGNOSIS — I10 Essential (primary) hypertension: Secondary | ICD-10-CM | POA: Diagnosis not present

## 2023-09-23 DIAGNOSIS — Z515 Encounter for palliative care: Secondary | ICD-10-CM | POA: Diagnosis not present

## 2023-09-23 DIAGNOSIS — Z1152 Encounter for screening for COVID-19: Secondary | ICD-10-CM

## 2023-09-23 DIAGNOSIS — Z79899 Other long term (current) drug therapy: Secondary | ICD-10-CM

## 2023-09-23 MED ORDER — ACETAMINOPHEN 325 MG PO TABS
650.0000 mg | ORAL_TABLET | Freq: Four times a day (QID) | ORAL | Status: DC | PRN
  Administered 2023-09-23 – 2023-09-24 (×2): 650 mg via ORAL
  Filled 2023-09-23 (×2): qty 2

## 2023-09-23 MED ORDER — HALOPERIDOL 0.5 MG PO TABS: 0.5000 mg | ORAL_TABLET | ORAL | Status: DC | PRN

## 2023-09-23 MED ORDER — SODIUM CHLORIDE 0.9% FLUSH: 3.0000 mL | Freq: Two times a day (BID) | INTRAVENOUS | Status: DC

## 2023-09-23 MED ORDER — HALOPERIDOL LACTATE 5 MG/ML IJ SOLN: 0.5000 mg | INTRAMUSCULAR | Status: DC | PRN

## 2023-09-23 MED ORDER — FENTANYL CITRATE PF 50 MCG/ML IJ SOSY
25.0000 ug | PREFILLED_SYRINGE | INTRAMUSCULAR | Status: DC | PRN
  Filled 2023-09-23: qty 1

## 2023-09-23 MED ORDER — LORAZEPAM 2 MG/ML IJ SOLN: 1.0000 mg | INTRAMUSCULAR | Status: DC | PRN

## 2023-09-23 MED ORDER — GLYCOPYRROLATE 1 MG PO TABS: 1.0000 mg | ORAL_TABLET | ORAL | Status: DC | PRN

## 2023-09-23 MED ORDER — GLYCOPYRROLATE 0.2 MG/ML IJ SOLN: 0.2000 mg | INTRAMUSCULAR | Status: DC | PRN

## 2023-09-23 MED ORDER — ONDANSETRON 4 MG PO TBDP: 4.0000 mg | ORAL_TABLET | Freq: Four times a day (QID) | ORAL | Status: DC | PRN

## 2023-09-23 MED ORDER — ACETAMINOPHEN 650 MG RE SUPP: 650.0000 mg | Freq: Four times a day (QID) | RECTAL | Status: DC | PRN

## 2023-09-23 MED ORDER — HALOPERIDOL LACTATE 2 MG/ML PO CONC: 0.5000 mg | ORAL | Status: DC | PRN

## 2023-09-23 MED ORDER — ONDANSETRON HCL 4 MG/2ML IJ SOLN: 4.0000 mg | Freq: Four times a day (QID) | INTRAMUSCULAR | Status: DC | PRN

## 2023-09-23 MED ORDER — ORAL CARE MOUTH RINSE
15.0000 mL | OROMUCOSAL | Status: DC | PRN
Start: 2023-09-23 — End: 2023-09-26

## 2023-09-23 MED ORDER — POLYVINYL ALCOHOL 1.4 % OP SOLN: 1.0000 [drp] | Freq: Four times a day (QID) | OPHTHALMIC | Status: DC | PRN

## 2023-09-23 MED ORDER — BIOTENE DRY MOUTH MT LIQD: 15.0000 mL | OROMUCOSAL | Status: DC | PRN

## 2023-09-23 NOTE — Care Management Important Message (Signed)
Important Message  Patient Details  Name: [name] Sr. MRN: [id number] Date of Birth: Oct 12, 1940   Important Message Given:  N/A - LOS <3 / Initial given by admissions     Corey Harold 09/23/2023, 3:32 PM

## 2023-09-23 NOTE — Progress Notes (Signed)
Chart reviewed. Patient is now on comfort care measures. Will sign off from a nephrology perspective. Thank you for involving [place] in the care of this patient. Please call with any questions/concerns.  Anthony Sar, MD [facility]

## 2023-09-23 NOTE — Progress Notes (Signed)
Palliative: Mr. Nester is lying quietly in bed.  He appears acutely/chronically ill and very frail.  He greets me, making and mostly keeping eye contact.  He is alert and oriented x 3, able to make his needs known.  He is surrounded by his loving family, his wife of 13 years Agustin Cree and his daughters Lupita Leash and Loistine Simas.    We talk about Mr. Oleski's declines over the last few weeks, last few days in particular.  We talked about what stopping dialysis would look like and feel like.  We talk about the benefits of comfort care, residential placement at [facility].  Mr. Coster and his family asked appropriate questions. [facility] hosp[facility] RN, Waynetta Sandy, is present to discuss residential hospice care.  We discussed symptom management needs.  Conference with attending, bedside nursing staff, transition of care team related to patient condition, needs, goals of care, disposition.  Plan: Patient and family are agreeable to stop dialysis, focus on comfort and dignity, let nature take its course with residential hospice at [facility].          Prognosis: 5 to 7 days would be anticipated as he is HD dependent  50 minutes  Lillia Carmel, NP  Palliative medicine team Team phone [phone]

## 2023-09-23 NOTE — Discharge Summary (Signed)
Physician Discharge Summary   Patient: [name]. MRN: [id number] DOB: 09-09-1940  Admit date:     09/16/2023  Discharge date: 09/23/23  Discharge Physician: Vassie Loll   PCP: Donita Brooks, MD   Recommendations at discharge:  Full comfort care and symptomatic management.  Discharge Diagnoses: Principal Problem:   Dysphagia Active Problems:   Type 2 diabetes mellitus with hyperlipidemia (HCC)   Essential hypertension, benign   DM type 2 with diabetic peripheral neuropathy (HCC)   Uncontrolled type 2 diabetes mellitus with hyperglycemia (HCC)   End stage renal disease (HCC)   Hemodialysis patient [facility])  Brief hospital admission course: [name] Ratz. is a 83 y.o. male with medical history significant for  ESRD on HD MWF, HTN, HLD, DM II, nephrolithiasis , hearing loss, history of palpitations/SVT presents to ED on 09/16/23--by [facility] from home with c/o weakness and difficulty swallowing for the past couple of days. He has felt like he has been choking on his food and liquid.    Last dialysis session was yesterday. Wife reported to EMS that she has seen a gradual decline over the last 6 months, but even more over the last 2 weeks. He is normally able to drive himself to dialysis, but afterwards he can't even walk back inside.    Additional history obtained from wife who is concerns about dysphagia, especially over last 2 weeks with post prandial emesis even with oatmeal and liquids -Emesis is without bile or blood--just ingested gastric contents  No diarrhea No fever  Or chills  No CP,  C/o weakness and fall risk    In ED  CT Head w/o acute findings  CXR w/o acute findings Covid, flu and RSV negative Na-132,  alk phos 323, otherwise LFT are not elevated WBC-10.6, Hgb 7.8 , platelets 106 Serum iron, ferritin, B12 and Folate are Not low  Assessment and Plan: 1-dysphagia/Candida esophagitis -Worsening the last 2 weeks with associated postprandial  emesis -Continue as needed analgesics and PPI -Clear liquid diets recommended by speech therapy and GI at the moment. -Follow GI service further recommendation -Endoscopic evaluation with positive Candida esophagitis; treatment with Diflucan has been started -Continue supportive care. -Speech therapy has completed MBS and has recommended dysphagia 3 with thin liquids. -Poor oral intake and further declining appreciated; after discussion with palliative care and family transitioning to comfort care and symptomatic management. -Comfort feeding will be offered.   2-end-stage renal disease -Appreciate assistance and recommendation by nephrology -Patient regular dialysis day Monday, Wednesday and Friday. -Continue to follow renal service recommendations. -Plan for short hemodialysis later today; this will be patient last hemodialysis as well transitioning into comfort care and symptomatic management only. -Continue supportive care and follow response.   3-anemia of chronic disease -No acute bleeding appreciated -IV iron and Epogen therapy as per nephrology discretion. -Hemoglobin 7.2. -Will transition care to comfort.   4-generalized weakness -Evaluation by physical therapy appreciated; recommendation for skilled nursing facility at discharge. -Family in agreement to pursued short-term rehabilitation trial and if patient fail to improve considering transition into comfort care and quality management. -At this moment patient condition has continued to decline and further discussion has elicited decision for comfort care and symptomatic management only. -Patient is a candidate for residential hospice.   5-type 2 diabetes mellitus with nephropathy -A1c 6.5 -Patient with another episode of hypoglycemia; Long-acting insulin has already been discontinued; will adjust sliding scale insulin for very sensitive and will try not to cover unless CBGs above  200. -No further CBGs check or insulin  therapy -Plan is for comfort care and symptomatic management only.   6-hypertension/SVT -Antihypertensive agents has been discontinued -Plan is for full comfort care and symptomatic management only.   7-depression/anxiety -Continue treatment with Abilify-Zoloft as much as patient able to take it. -Transition to full comfort care and will follow symptomatic management. -Patient has been accepted to hospice.   8-pressure injury -Stage II; see below for details -Continue constant repositioning and preventive measures. -POA   9-hospital-acquired delirium/somnolence -Very somnolent and demonstrating difficulty following commands. -No acute neurologic deficit appreciated; moving 4 limbs spontaneously. -After further discussing with palliative care service, family and healthcare power of attorney decision has been made to pursuit comfort care and symptomatic management only. -Patient has been seen by hospice who agree with patient's candidacy for residential placement; GIP has been granted and.  To hospice care will be initiated while waiting for bed availability.  Consultants: Palliative care, nephrology service; gastroenterology service. Procedures performed: See below for x-ray reports.  EGD, inpatient hemodialysis. Disposition: Hospice care Diet recommendation: Comfort feeding.  (Dysphagia 3 diet with thin liquids).  DISCHARGE MEDICATION: Allergies as of 09/23/2023       Reactions   Enalapril Maleate Cough        Medication List     ASK your doctor about these medications    acetaminophen 500 MG tablet Commonly known as: TYLENOL Take 500-1,000 mg by mouth every 6 (six) hours as needed (PAIN.).   ARIPiprazole 2 MG tablet Commonly known as: Abilify Take 1 tablet (2 mg total) by mouth daily.   diazepam 2 MG tablet Commonly known as: VALIUM Take 1 tablet (2 mg total) by mouth at bedtime.   diltiazem 240 MG 24 hr capsule Commonly known as: CARDIZEM CD TAKE 1 CAPSULE(240  MG) BY MOUTH DAILY   FreeStyle Libre 3 Plus Sensor Misc Change sensor every 15 days.   HYDROcodone-acetaminophen 5-325 MG tablet Commonly known as: NORCO/VICODIN Take 1 tablet by mouth every 12 (twelve) hours as needed for severe pain (pain score 7-10). Must last 30 days.   insulin aspart 100 UNIT/ML injection Commonly known as: novoLOG Inject 8 Units into the skin 3 (three) times daily with meals.   insulin glargine 100 UNIT/ML injection Commonly known as: LANTUS Inject 0.12 mLs (12 Units total) into the skin at bedtime.   lidocaine-prilocaine cream Commonly known as: EMLA APPLY SMALL AMOUNT TO ACCESS SITE (AVF) 30 MINUTES BEFORE DIALYSIS. COVER WITH OCCLUSIVE DRESSING (SARAN WRAP)   pantoprazole 40 MG tablet Commonly known as: PROTONIX Take 1 tablet (40 mg total) by mouth daily.   pravastatin 10 MG tablet Commonly known as: PRAVACHOL TAKE 1 TABLET(10 MG) BY MOUTH DAILY WITH BREAKFAST   sertraline 50 MG tablet Commonly known as: ZOLOFT Take 1 tablet (50 mg total) by mouth daily.   sevelamer carbonate 800 MG tablet Commonly known as: RENVELA Take 2,400 mg by mouth 3 (three) times daily with meals.        Contact information for after-discharge care     Destination     [facility] Preferred SNF .   Service: Skilled Nursing Contact information: [address] [place] [address] [phone]                    Discharge Exam: Ceasar Mons Weights   09/19/23 1332 09/19/23 1745 09/22/23 1345  Weight: 62.2 kg 61.8 kg 61.2 kg   General exam: Alert, awake, able to follow simple  commands on today's exam; chronically ill in appearance.  In no acute distress.  No nausea or vomiting reported. Respiratory system: Good saturation on room air. Cardiovascular system: Rate controlled, no rubs, no gallops, no JVD. Gastrointestinal system: Abdomen is nondistended, soft and nontender. No organomegaly or masses felt. Normal bowel sounds  heard. Central nervous system: Generally weak.  No focal neurological deficits. Extremities: No cyanosis or clubbing. Skin: No petechiae.  Stage II pressure injuries present at time of admission without signs of superimposed infection Psychiatry: Flat affect appreciated on exam.  Condition at discharge: Fair and in not acute distress.  The results of significant diagnostics from this hospitalization (including imaging, microbiology, ancillary and laboratory) are listed below for reference.   Imaging Studies: DG Swallowing Func-Speech Pathology Result Date: 09/19/2023 Table formatting from the original result was not included. Modified Barium Swallow Study Patient Details Name: GARNELL PHENIX [name]. MRN: [id number] Date of Birth: 10-15-1940 Today's Date: 09/19/2023 HPI/PMH: HPI: Taavi Hoose. is a 83 y.o. male with medical history significant for  ESRD on HD MWF, HTN, HLD, DM II, nephrolithiasis , hearing loss, history of palpitations/SVT presents to ED on 09/16/23--by [facility] from home with c/o weakness and difficulty swallowing for the past couple of days. He has felt like he has been choking on his food and liquid. BSE requested Clinical Impression: Pt presents with mild/moderate oropharyngeal dysphagia characterized by oral/pharyngeal weakness, repetitive/disorganized tongue rocking with puree/solid textures, diminished pharyngeal stripping wave, and decreased epiglottic deflection. Decreased strength and coordination of swallowing results in residue of varying amounts; trace to min residue noted with thin liquids and increased pharyngeal residue noted in moderate amounts with puree and regular textures. Dry repeat swallows are effective in clearing pharyngeal residue. Note occasional flash frank penetration of thin liquids. Barium tablet with brief stasis at the level of the valleculae and silent aspiration of thin liquids when attempting to swallow the barium tablet. Esophageal sweep reveals  standing column of barium with some to and fro movement. No radiologist present to confirm. Pt reports frequent nasal regurgitation, however this was not visualized on MBSS. Recommend progress diet according to GI recommendations; from oropharyngeal standpoint recommend D3/mech soft and thin liquids. Recommend meds be administered whole with puree. Pt will benefit from ST f/u at d/c venue to reinforce safe swallowing strategies and aspiration precautions. Pt is at increased risk for aspiration given esophageal component of swallowing. Thank you, Factors that may increase risk of adverse event in presence of aspiration [name] & [name] 2021): No data recorded Recommendations/Plan: Swallowing Evaluation Recommendations Swallowing Evaluation Recommendations Recommendations: PO diet PO Diet Recommendation: Dysphagia 3 (Mechanical soft); Thin liquids (Level 0) Liquid Administration via: Cup; Straw Medication Administration: Whole meds with puree Supervision: Patient able to self-feed; Intermittent supervision/cueing for swallowing strategies Swallowing strategies  : Small bites/sips; Multiple dry swallows after each bite/sip Postural changes: Position pt fully upright for meals Oral care recommendations: Oral care BID (2x/day) Treatment Plan Treatment Plan Treatment recommendations: Therapy as outlined in treatment plan below Follow-up recommendations: Skilled nursing-short term rehab (<3 hours/day) Functional status assessment: Patient has had a recent decline in their functional status and demonstrates the ability to make significant improvements in function in a reasonable and predictable amount of time. Treatment frequency: Min 1x/week Treatment duration: 1 week Interventions: Aspiration precaution training; Diet toleration management by SLP Recommendations Recommendations for follow up therapy are one component of a multi-disciplinary discharge planning process, led by the attending physician.  Recommendations may  be updated based on  patient status, additional functional criteria and insurance authorization. Assessment: Orofacial Exam: Orofacial Exam Oral Cavity: Oral Hygiene: WFL Oral Cavity - Dentition: Poor condition Anatomy: Anatomy: WFL Boluses Administered: Boluses Administered Boluses Administered: Thin liquids (Level 0); Mildly thick liquids (Level 2, nectar thick); Moderately thick liquids (Level 3, honey thick); Puree; Solid  Oral Impairment Domain: Oral Impairment Domain Lip Closure: No labial escape Tongue control during bolus hold: Escape to lateral buccal cavity/floor of mouth; Posterior escape of less than half of bolus Bolus preparation/mastication: Disorganized chewing/mashing with solid pieces of bolus unchewed Bolus transport/lingual motion: Repetitive/disorganized tongue motion Oral residue: Trace residue lining oral structures Location of oral residue : Tongue; Palate Initiation of pharyngeal swallow : Posterior angle of the ramus  Pharyngeal Impairment Domain: Pharyngeal Impairment Domain Soft palate elevation: Trace column of contrast or air between SP and PW Laryngeal elevation: Partial superior movement of thyroid cartilage/partial approximation of arytenoids to epiglottic petiole Anterior hyoid excursion: Partial anterior movement Epiglottic movement: Partial inversion Laryngeal vestibule closure: Incomplete, narrow column air/contrast in laryngeal vestibule Pharyngeal stripping wave : Present - diminished Pharyngeal contraction (A/P view only): N/A Pharyngoesophageal segment opening: Complete distension and complete duration, no obstruction of flow Tongue base retraction: Trace column of contrast or air between tongue base and PPW Pharyngeal residue: Trace residue within or on pharyngeal structures; Collection of residue within or on pharyngeal structures Location of pharyngeal residue: Tongue base; Pharyngeal wall; Aryepiglottic folds; Valleculae; Diffuse (>3 areas)  Esophageal Impairment Domain:  Esophageal Impairment Domain Esophageal clearance upright position: Esophageal retention with retrograde flow below pharyngoesophageal segment (PES) Pill: Pill Consistency administered: Thin liquids (Level 0) Thin liquids (Level 0): Impaired (see clinical impressions) Penetration/Aspiration Scale Score: Penetration/Aspiration Scale Score 1.  Material does not enter airway: Mildly thick liquids (Level 2, nectar thick); Moderately thick liquids (Level 3, honey thick); Puree; Solid; Pill 2.  Material enters airway, remains ABOVE vocal cords then ejected out: Thin liquids (Level 0) 8.  Material enters airway, passes BELOW cords without attempt by patient to eject out (silent aspiration) : Thin liquids (Level 0) (one episode when swallowing barium tablet) Compensatory Strategies: No data recorded  General Information: Caregiver present: No  Diet Prior to this Study: Clear liquid diet   Temperature : Normal   No data recorded  Supplemental O2: None (Room air)   History of Recent Intubation: No  Behavior/Cognition: Cooperative; Pleasant mood; Lethargic/Drowsy Self-Feeding Abilities: Able to self-feed Baseline vocal quality/speech: Normal Volitional Cough: Able to elicit Volitional Swallow: Able to elicit Exam Limitations: No limitations Goal Planning: Prognosis for improved oropharyngeal function: Fair No data recorded No data recorded No data recorded Consulted and agree with results and recommendations: Patient; Nurse; Physician Pain: Pain Assessment Pain Assessment: No/denies pain End of Session: Start Time:SLP Start Time (ACUTE ONLY): 1059 Stop Time: SLP Stop Time (ACUTE ONLY): 1114 Time Calculation:SLP Time Calculation (min) (ACUTE ONLY): 15 min Charges: SLP Evaluations $ SLP Speech Visit: 1 Visit SLP Evaluations $MBS Swallow: 1 Procedure $Swallowing Treatment: 1 Procedure SLP visit diagnosis: SLP Visit Diagnosis: Dysphagia, oropharyngeal phase (R13.12); Dysphagia, unspecified (R13.10) Past Medical History: Past  Medical History: Diagnosis Date  Arthritis   lower back  Chronic back pain   Disc disease  Chronic kidney disease   Coronary atherosclerosis of native coronary artery   Coronary calcifications by chest CT, Myoview demonstrating inferolateral scar  Deafness in right ear   Diabetes mellitus   Diabetic retinopathy (HCC)   moderate nonproliferative with macular edema in right eye  Diastolic dysfunction 12/2011  Grade 1.  Essential hypertension, benign   Heart murmur   in past  HOH (hard of hearing)   Hyperkalemia   Kidney stones   Mixed hyperlipidemia   NAFLD (nonalcoholic fatty liver disease)   Pancreatic insufficiency   Diagnosed at [facility]  Prostate cancer [facility]) 2011  Shortness of breath dyspnea   occasional - liver pressing on right lung, decreased capacity  Subdural hematoma (HCC)   Type 2 diabetes mellitus (HCC)   Wears hearing aid   left Past Surgical History: Past Surgical History: Procedure Laterality Date  A/V FISTULAGRAM Left 07/02/2023  Procedure: A/V Fistulagram;  Surgeon: Daria Pastures, MD;  Location: [facility] INVASIVE CV LAB;  Service: Vascular;  Laterality: Left;  APPENDECTOMY    AV FISTULA PLACEMENT Left 05/15/2021  Procedure: INSERTION OF LEFT ARM ARTERIOVENOUS (AV) GORE-TEX GRAFT;  Surgeon: Larina Earthly, MD;  Location: [facility] ORS;  Service: Vascular;  Laterality: Left;  Bilateral hip replacement    BIOPSY  09/17/2023  Procedure: BIOPSY;  Surgeon: Corbin Ade, MD;  Location: [facility] ENDO SUITE;  Service: Endoscopy;;  CATARACT EXTRACTION W/PHACO Left 05/15/2015  Procedure: CATARACT EXTRACTION PHACO AND INTRAOCULAR LENS PLACEMENT (IOC);  Surgeon: Sherald Hess, MD;  Location: [facility] SURGERY CNTR;  Service: Ophthalmology;  Laterality: Left;  DIABETIC - insulin pump and oral meds  CATARACT EXTRACTION W/PHACO Right 08/28/2015  Procedure: CATARACT EXTRACTION PHACO AND INTRAOCULAR LENS PLACEMENT (IOC);  Surgeon: Sherald Hess, MD;  Location: [facility] SURGERY CN[facility];  Service: Ophthalmology;   Laterality: Right;  DIABETIC PER PT AND CINDY PLEASE KEEP ARRIVAL TIME AFTER 8AM   CHOLECYSTECTOMY    CYSTOSCOPY W/ URETERAL STENT PLACEMENT Right 05/29/2023  Procedure: CYSTOSCOPY WITH RETROGRADE PYELOGRAM/URETERAL STENT PLACEMENT;  Surgeon: Malen Gauze, MD;  Location: [facility];  Service: Urology;  Laterality: Right;  CYSTOSCOPY WITH RETROGRADE PYELOGRAM, URETEROSCOPY AND STENT PLACEMENT Right 07/24/2023  Procedure: CYSTOSCOPY WITH RETROGRADE PYELOGRAM, URETEROSCOPY AND STENT PLACEMENT;  Surgeon: Malen Gauze, MD;  Location: [facility];  Service: Urology;  Laterality: Right;  dialysis pt  ESOPHAGEAL BRUSHING  09/17/2023  Procedure: ESOPHAGEAL BRUSHING;  Surgeon: Corbin Ade, MD;  [facility];  Service: Endoscopy;;  ESOPHAGOGASTRODUODENOSCOPY (EGD) WITH PROPOFOL N/A 09/17/2023  Procedure: ESOPHAGOGASTRODUODENOSCOPY (EGD) WITH PROPOFOL;  Surgeon: Corbin Ade, MD;  Location: [facility];  Service: Endoscopy;  Laterality: N/A;  and dilation  HERNIA REPAIR    INSERTION OF DIALYSIS CATHETER Right 01/22/2021  Procedure: INSERTION OF DIALYSIS CATHETER;  Surgeon: Lucretia Roers, MD;  Location: [facility];  Service: General;  Laterality: Right;  INSERTION OF DIALYSIS CATHETER Right 04/16/2021  Procedure: INSERTION OF DIALYSIS CATHETER;  Surgeon: Lucretia Roers, MD;  Location: [facility];  Service: General;  Laterality: Right;  MALONEY DILATION  09/17/2023  Procedure: Elease Hashimoto DILATION;  Surgeon: Corbin Ade, MD;  Location: [facility];  Service: Endoscopy;;  PERIPHERAL VASCULAR BALLOON ANGIOPLASTY  07/02/2023  Procedure: PERIPHERAL VASCULAR BALLOON ANGIOPLASTY;  Surgeon: Daria Pastures, MD;  Location: [facility] INVASIVE CV LAB;  Service: Vascular;;  lt arm fistula  PERIPHERAL VASCULAR BALLOON ANGIOPLASTY  09/01/2023  Procedure: PERIPHERAL VASCULAR BALLOON ANGIOPLASTY;  Surgeon: Ethelene Hal, MD;  Location: [facility] INVASIVE CV LAB;  Service: Cardiovascular;;  50% Venous Anastomosis; 70% Intragraft   PERIPHERAL VASCULAR THROMBECTOMY N/A 09/01/2023  Procedure: PERIPHERAL VASCULAR THROMBECTOMY;  Surgeon: Ethelene Hal, MD;  Location: [facility] INVASIVE CV LAB;  Service: Cardiovascular;  Laterality: N/A;  PROSTATECTOMY  2011  STONE EXTRACTION WITH BASKET Right 07/24/2023  Procedure:  STONE EXTRACTION WITH BASKET;  Surgeon: Malen Gauze, MD;  Location: [facility];  Service: Urology;  Laterality: Right; Amelia H. Romie Levee, CCC-SLP Speech Language Pathologist Georgetta Haber 09/19/2023, 12:34 PM  DG Chest Port 1 View Result Date: 09/16/2023 CLINICAL DATA:  Weakness and cough. EXAM: PORTABLE CHEST 1 VIEW COMPARISON:  Chest radiograph dated 08/15/2023. FINDINGS: Shallow inspiration. There is mild elevation of the right hemidiaphragm. Bibasilar atelectasis. No focal consolidation, pleural effusion, pneumothorax. Atherosclerotic calcification of the aorta. No acute osseous pathology. IMPRESSION: No acute findings. Electronically Signed   By: Elgie Collard M.D.   On: 09/16/2023 11:32   CT Head Wo Contrast Result Date: 09/16/2023 CLINICAL DATA:  Provided history: Head trauma, moderate/severe. Headache, neuro deficit. Additional history obtained from electronic MEDICAL RECORD NUMBERWeakness, difficulty swallowing food. EXAM: CT HEAD WITHOUT CONTRAST TECHNIQUE: Contiguous axial images were obtained from the base of the skull through the vertex without intravenous contrast. RADIATION DOSE REDUCTION: This exam was performed according to the departmental dose-optimization program which includes automated exposure control, adjustment of the mA and/or kV according to patient size and/or use of iterative reconstruction technique. COMPARISON:  Non-contrast head CT 08/15/2023. Brain MRI 09/06/2013. FINDINGS: Brain: Generalized cerebral atrophy. Patchy and ill-defined hypoattenuation within the cerebral white matter, nonspecific but compatible with mild-to-moderate chronic small vessel ischemic disease. There is no acute  intracranial hemorrhage. No demarcated cortical infarct. No extra-axial fluid collection. No evidence of an intracranial mass. No midline shift. Vascular: No hyperdense vessel.  Atherosclerotic calcifications. Skull: No calvarial fracture or aggressive osseous lesion. Sinuses/Orbits: No mass or acute finding within the imaged orbits. 15 mm polypoid soft tissue focus, and minimal background mucosal thickening, within the right maxillary sinus. Minimal mucosal thickening within the left maxillary sinus at the imaged levels. Other: Small-volume fluid within right mastoid air cells. IMPRESSION: 1.  No evidence of an acute intracranial abnormality. 2. Parenchymal atrophy and chronic small vessel ischemic disease. 3. Paranasal sinus disease at the imaged levels (including a 15 mm polypoid soft tissue focus within the right maxillary sinus). Consider ENT referral. 4. Small right mastoid effusion. Electronically Signed   By: Jackey Loge D.O.   On: 09/16/2023 11:00    Microbiology: Results for orders placed or performed during the hospital encounter of 09/16/23  Resp panel by RT-PCR (RSV, Flu A&B, Covid) Anterior Nasal Swab     Status: None   Collection Time: 09/16/23 11:10 AM   Specimen: Anterior Nasal Swab  Result Value Ref Range Status   SARS Coronavirus 2 by RT PCR NEGATIVE NEGATIVE Final    Comment: (NOTE) SARS-CoV-2 target nucleic acids are NOT DETECTED.  The SARS-CoV-2 RNA is generally detectable in upper respiratory specimens during the acute phase of infection. The lowest concentration of SARS-CoV-2 viral copies this assay can detect is 138 copies/mL. A negative result does not preclude SARS-Cov-2 infection and should not be used as the sole basis for treatment or other patient management decisions. A negative result may occur with  improper specimen collection/handling, submission of specimen other than nasopharyngeal swab, presence of viral mutation(s) within the areas targeted by this assay,  and inadequate number of viral copies(<138 copies/mL). A negative result must be combined with clinical observations, patient history, and epidemiological information. The expected result is Negative.  Fact Sheet for Patients:  BloggerCourse.com  Fact Sheet for Healthcare Providers:  SeriousBroker.it  This test is no t yet approved or cleared by the [place] FDA and  has been authorized for detection and/or diagnosis of  SARS-CoV-2 by FDA under an Emergency Use Authorization (EUA). This EUA will remain  in effect (meaning this test can be used) for the duration of the COVID-19 declaration under Section 564(b)(1) of the Act, 21 U.S.C.section 360bbb-3(b)(1), unless the authorization is terminated  or revoked sooner.       Influenza A by PCR NEGATIVE NEGATIVE Final   Influenza B by PCR NEGATIVE NEGATIVE Final    Comment: (NOTE) The Xpert Xpress SARS-CoV-2/FLU/RSV plus assay is intended as an aid in the diagnosis of influenza from Nasopharyngeal swab specimens and should not be used as a sole basis for treatment. Nasal washings and aspirates are unacceptable for Xpert Xpress SARS-CoV-2/FLU/RSV testing.  Fact Sheet for Patients: BloggerCourse.com  Fact Sheet for Healthcare Providers: SeriousBroker.it  This test is not yet approved or cleared by the [place] FDA and has been authorized for detection and/or diagnosis of SARS-CoV-2 by FDA under an Emergency Use Authorization (EUA). This EUA will remain in effect (meaning this test can be used) for the duration of the COVID-19 declaration under Section 564(b)(1) of the Act, 21 U.S.C. section 360bbb-3(b)(1), unless the authorization is terminated or revoked.     Resp Syncytial Virus by PCR NEGATIVE NEGATIVE Final    Comment: (NOTE) Fact Sheet for Patients: BloggerCourse.com  Fact Sheet for  Healthcare Providers: SeriousBroker.it  This test is not yet approved or cleared by the [place] FDA and has been authorized for detection and/or diagnosis of SARS-CoV-2 by FDA under an Emergency Use Authorization (EUA). This EUA will remain in effect (meaning this test can be used) for the duration of the COVID-19 declaration under Section 564(b)(1) of the Act, 21 U.S.C. section 360bbb-3(b)(1), unless the authorization is terminated or revoked.  Performed at [facility], [address] [place], [place] [address]   [facility] prep     Status: None   Collection Time: 09/17/23  1:07 PM   Specimen: PATH Cytology brushing; Body Fluid  Result Value Ref Range Status   Specimen Description ESOPHAGUS  Final   Special Requests NONE  Final   KOH Prep   Final    BUDDING YEAST SEEN YEAST WITH PSEUDOHYPHAE Performed at [facility], [address], [place], [place] [address]    Report Status 09/17/2023 FINAL  Final    Labs: CBC: Recent Labs  Lab 09/17/23 0455 09/19/23 1441 09/22/23 1056  WBC 9.7 9.6 8.5  HGB 7.2* 7.1* 7.7*  HCT 22.9* 22.5* 25.3*  MCV 106.0* 104.7* 105.4*  PLT 107* 107* 132*   Basic Metabolic Panel: Recent Labs  Lab 09/17/23 0455 09/19/23 1441 09/22/23 1056  NA 133* 129* 131*  K 4.7 3.5 3.3*  CL 97* 95* 96*  CO2 24 23 24   GLUCOSE 82 190* 88  BUN 52* 37* 26*  CREATININE 2.77* 3.45* 3.72*  CALCIUM 7.0* 6.8* 7.4*  PHOS  --  3.8 4.0   Liver Function Tests: Recent Labs  Lab 09/19/23 1441 09/22/23 1056  ALBUMIN 1.6* 1.7*   CBG: Recent Labs  Lab 09/22/23 0820 09/22/23 0849 09/22/23 0954 09/22/23 1115 09/22/23 1619  GLUCAP 100* 88 97 86 68*    Discharge time spent: greater than 30 minutes.  Signed: Vassie Loll, MD [facility] 09/23/2023

## 2023-09-23 NOTE — Plan of Care (Signed)
  Problem: Pain Management: Goal: Satisfaction with pain management regimen will improve Outcome: Progressing   

## 2023-09-23 NOTE — H&P (Signed)
History and Physical    Patient: [name] ZOX:[id number] DOB: June 30, 1941 DOA: 09/23/2023 DOS: the patient was seen and examined on 09/23/2023 PCP: Donita Brooks, MD  Patient coming from: Home; GIP while waiting hospice bed.  Chief Complaint: End-of-life care.   Brief Hospital admission narrative: [name]. is a 83 y.o. male with medical history significant for  ESRD on HD MWF, HTN, HLD, DM II, nephrolithiasis , hearing loss, history of palpitations/SVT presents to ED on 09/16/23--by [facility] from home with c/o weakness and difficulty swallowing for the past couple of days. He has felt like he has been choking on his food and liquid.    Last dialysis session was yesterday. Wife reported to EMS that she has seen a gradual decline over the last 6 months, but even more over the last 2 weeks. He is normally able to drive himself to dialysis, but afterwards he can't even walk back inside.    Additional history obtained from wife who is concerns about dysphagia, especially over last 2 weeks with post prandial emesis even with oatmeal and liquids -Emesis is without bile or blood--just ingested gastric contents  No diarrhea No fever  Or chills  No CP,  C/o weakness and fall risk    In ED  CT Head w/o acute findings  CXR w/o acute findings Covid, flu and RSV negative Na-132,  alk phos 323, otherwise LFT are not elevated WBC-10.6, Hgb 7.8 , platelets 106 Serum iron, ferritin, B12 and Folate are Not low  Review of Systems: As mentioned in the history of present illness. All other systems reviewed and are negative. Past Medical History:  Diagnosis Date   Arthritis    lower back   Chronic back pain    Disc disease   Chronic kidney disease    Coronary atherosclerosis of native coronary artery    Coronary calcifications by chest CT, Myoview demonstrating inferolateral scar   Deafness in right ear    Diabetes mellitus    Diabetic retinopathy (HCC)    moderate  nonproliferative with macular edema in right eye   Diastolic dysfunction 12/2011   Grade 1.   Essential hypertension, benign    Heart murmur    in past   HOH (hard of hearing)    Hyperkalemia    Kidney stones    Mixed hyperlipidemia    NAFLD (nonalcoholic fatty liver disease)    Pancreatic insufficiency    Diagnosed at [facility]   Prostate cancer [facility]) 2011   Shortness of breath dyspnea    occasional - liver pressing on right lung, decreased capacity   Subdural hematoma (HCC)    Type 2 diabetes mellitus (HCC)    Wears hearing aid    left   Past Surgical History:  Procedure Laterality Date   A/V FISTULAGRAM Left 07/02/2023   Procedure: A/V Fistulagram;  Surgeon: Daria Pastures, MD;  Location: [facility] INVASIVE CV LAB;  Service: Vascular;  Laterality: Left;   APPENDECTOMY     AV FISTULA PLACEMENT Left 05/15/2021   Procedure: INSERTION OF LEFT ARM ARTERIOVENOUS (AV) GORE-TEX GRAFT;  Surgeon: Larina Earthly, MD;  Location: [facility];  Service: Vascular;  Laterality: Left;   Bilateral hip replacement     BIOPSY  09/17/2023   Procedure: BIOPSY;  Surgeon: Corbin Ade, MD;  [facility];  Service: Endoscopy;;   CATARACT EXTRACTION W/PHACO Left 05/15/2015   Procedure: CATARACT EXTRACTION PHACO AND INTRAOCULAR LENS PLACEMENT (IOC);  Surgeon: Lavella Hammock Vin-Parikh,  MD;  Location: [facility];  Service: Ophthalmology;  Laterality: Left;  DIABETIC - insulin pump and oral meds   CATARACT EXTRACTION W/PHACO Right 08/28/2015   Procedure: CATARACT EXTRACTION PHACO AND INTRAOCULAR LENS PLACEMENT (IOC);  Surgeon: Sherald Hess, MD;  Location: [facility];  Service: Ophthalmology;  Laterality: Right;  DIABETIC PER PT AND CINDY PLEASE KEEP ARRIVAL TIME AFTER 8AM    CHOLECYSTECTOMY     CYSTOSCOPY W/ URETERAL STENT PLACEMENT Right 05/29/2023   Procedure: CYSTOSCOPY WITH RETROGRADE PYELOGRAM/URETERAL STENT PLACEMENT;  Surgeon: Malen Gauze, MD;  Location: [facility];   Service: Urology;  Laterality: Right;   CYSTOSCOPY WITH RETROGRADE PYELOGRAM, URETEROSCOPY AND STENT PLACEMENT Right 07/24/2023   Procedure: CYSTOSCOPY WITH RETROGRADE PYELOGRAM, URETEROSCOPY AND STENT PLACEMENT;  Surgeon: Malen Gauze, MD;  Location: [facility];  Service: Urology;  Laterality: Right;  dialysis pt   ESOPHAGEAL BRUSHING  09/17/2023   Procedure: ESOPHAGEAL BRUSHING;  Surgeon: Corbin Ade, MD;  [facility];  Service: Endoscopy;;   ESOPHAGOGASTRODUODENOSCOPY (EGD) WITH PROPOFOL N/A 09/17/2023   Procedure: ESOPHAGOGASTRODUODENOSCOPY (EGD) WITH PROPOFOL;  Surgeon: Corbin Ade, MD;  [facility];  Service: Endoscopy;  Laterality: N/A;  and dilation   HERNIA REPAIR     INSERTION OF DIALYSIS CATHETER Right 01/22/2021   Procedure: INSERTION OF DIALYSIS CATHETER;  Surgeon: Lucretia Roers, MD;  Location: [facility];  Service: General;  Laterality: Right;   INSERTION OF DIALYSIS CATHETER Right 04/16/2021   Procedure: INSERTION OF DIALYSIS CATHETER;  Surgeon: Lucretia Roers, MD;  Location: [facility];  Service: General;  Laterality: Right;   MALONEY DILATION  09/17/2023   Procedure: Elease Hashimoto DILATION;  Surgeon: Corbin Ade, MD;  [facility];  Service: Endoscopy;;   PERIPHERAL VASCULAR BALLOON ANGIOPLASTY  07/02/2023   Procedure: PERIPHERAL VASCULAR BALLOON ANGIOPLASTY;  Surgeon: Daria Pastures, MD;  Location: [facility] INVASIVE CV LAB;  Service: Vascular;;  lt arm fistula   PERIPHERAL VASCULAR BALLOON ANGIOPLASTY  09/01/2023   Procedure: PERIPHERAL VASCULAR BALLOON ANGIOPLASTY;  Surgeon: Ethelene Hal, MD;  Location: [facility] INVASIVE CV LAB;  Service: Cardiovascular;;  50% Venous Anastomosis; 70% Intragraft   PERIPHERAL VASCULAR THROMBECTOMY N/A 09/01/2023   Procedure: PERIPHERAL VASCULAR THROMBECTOMY;  Surgeon: Ethelene Hal, MD;  Location: [facility] INVASIVE CV LAB;  Service: Cardiovascular;  Laterality: N/A;   PROSTATECTOMY  2011   STONE EXTRACTION WITH BASKET Right  07/24/2023   Procedure: STONE EXTRACTION WITH BASKET;  Surgeon: Malen Gauze, MD;  Location: [facility];  Service: Urology;  Laterality: Right;   Social History:  reports that he has never smoked. He has never used smokeless tobacco. He reports that he does not drink alcohol and does not use drugs.  Allergies  Allergen Reactions   Enalapril Maleate Cough    Family History  Problem Relation Age of Onset   Diabetes type II Mother    Heart attack Father    Colon cancer Neg Hx    Gastric cancer Neg Hx    Esophageal cancer Neg Hx     Prior to Admission medications   Medication Sig Start Date End Date Taking? Authorizing Provider  acetaminophen (TYLENOL) 500 MG tablet Take 500-1,000 mg by mouth every 6 (six) hours as needed (PAIN.).    [provider]  ARIPiprazole (ABILIFY) 2 MG tablet Take 1 tablet (2 mg total) by mouth daily. Patient taking differently: Take 2 mg by mouth at bedtime. 08/21/23   Donita Brooks, MD  Continuous Glucose Sensor (FREESTYLE LIBRE 3 PLUS SENSOR) MISC Change sensor every 15 days. 06/02/23   Donita Brooks, MD  diazepam (VALIUM) 2 MG tablet Take 1 tablet (2 mg total) by mouth at bedtime. Patient not taking: Reported on 08/21/2023 07/18/23   Donita Brooks, MD  diltiazem (CARDIZEM CD) 240 MG 24 hr capsule TAKE 1 CAPSULE(240 MG) BY MOUTH DAILY 09/30/22   Donita Brooks, MD  HYDROcodone-acetaminophen (NORCO/VICODIN) 5-325 MG tablet Take 1 tablet by mouth every 12 (twelve) hours as needed for severe pain (pain score 7-10). Must last 30 days. 09/10/23 10/10/23  Edward Jolly, MD  insulin aspart (NOVOLOG) 100 UNIT/ML injection Inject 8 Units into the skin 3 (three) times daily with meals. Patient taking differently: Inject 2 Units into the skin every Monday, Wednesday, and Friday. 02/06/21   Sherryll Burger, Pratik D, DO  insulin glargine (LANTUS) 100 UNIT/ML injection Inject 0.12 mLs (12 Units total) into the skin at bedtime. Patient taking differently: Inject 6  Units into the skin every Tuesday, Thursday, Saturday, and Sunday at 6 PM. 03/20/21   Pickard, Priscille Heidelberg, MD  lidocaine-prilocaine (EMLA) cream APPLY SMALL AMOUNT TO ACCESS SITE (AVF) 30 MINUTES BEFORE DIALYSIS. COVER WITH OCCLUSIVE DRESSING Jasper Riling WRAP) 08/28/23   Donita Brooks, MD  pantoprazole (PROTONIX) 40 MG tablet Take 1 tablet (40 mg total) by mouth daily. 08/28/23   Donita Brooks, MD  pravastatin (PRAVACHOL) 10 MG tablet TAKE 1 TABLET(10 MG) BY MOUTH DAILY WITH BREAKFAST 03/14/23   Donita Brooks, MD  sertraline (ZOLOFT) 50 MG tablet Take 1 tablet (50 mg total) by mouth daily. 09/04/23   Donita Brooks, MD  sevelamer carbonate (RENVELA) 800 MG tablet Take 2,400 mg by mouth 3 (three) times daily with meals. 05/09/23   [provider]    Physical Exam: General exam: Alert, awake, able to follow simple commands on today's exam; chronically ill in appearance.  In no acute distress.  No nausea or vomiting reported. Respiratory system: Good saturation on room air. Cardiovascular system: Rate controlled, no rubs, no gallops, no JVD. Gastrointestinal system: Abdomen is nondistended, soft and nontender. No organomegaly or masses felt. Normal bowel sounds heard. Central nervous system: Generally weak.  No focal neurological deficits. Extremities: No cyanosis or clubbing. Skin: No petechiae.  Stage II pressure injuries present at time of admission without signs of superimposed infection Psychiatry: Flat affect appreciated on exam.  Data Reviewed: None.  Assessment and Plan: 1-dysphagia/Candida esophagitis -Worsening the last 2 weeks with associated postprandial emesis -Continue as needed analgesics and PPI -Clear liquid diets recommended by speech therapy and GI at the moment. -Follow GI service further recommendation -Endoscopic evaluation with positive Candida esophagitis; treatment with Diflucan has been started -Continue supportive care. -Speech therapy has completed MBS  and has recommended dysphagia 3 with thin liquids. -Poor oral intake and further declining appreciated; after discussion with palliative care and family transitioning to comfort care and symptomatic management. -Comfort feeding will be offered.   2-end-stage renal disease -Appreciate assistance and recommendation by nephrology -Patient regular dialysis day Monday, Wednesday and Friday. -Continue to follow renal service recommendations. -Plan for short hemodialysis later today; this will be patient last hemodialysis as well transitioning into comfort care and symptomatic management only. -Continue supportive care and follow response.   3-anemia of chronic disease -No acute bleeding appreciated -IV iron and Epogen therapy as per nephrology discretion. -Hemoglobin 7.2. -Will transition care to comfort.   4-generalized weakness -Evaluation by physical therapy appreciated; recommendation for  skilled nursing facility at discharge. -Family in agreement to pursued short-term rehabilitation trial and if patient fail to improve considering transition into comfort care and quality management. -At this moment patient condition has continued to decline and further discussion has elicited decision for comfort care and symptomatic management only. -Patient is a candidate for residential hospice.   5-type 2 diabetes mellitus with nephropathy -A1c 6.5 -Patient with another episode of hypoglycemia; Long-acting insulin has already been discontinued; will adjust sliding scale insulin for very sensitive and will try not to cover unless CBGs above 200. -No further CBGs check or insulin therapy -Plan is for comfort care and symptomatic management only.   6-hypertension/SVT -Antihypertensive agents has been discontinued -Plan is for full comfort care and symptomatic management only.   7-depression/anxiety -Continue treatment with Abilify-Zoloft as much as patient able to take it. -Transition to full  comfort care and will follow symptomatic management. -Patient has been accepted to hospice.   8-pressure injury -Stage II; see below for details -Continue constant repositioning and preventive measures. -POA   9-hospital-acquired delirium/somnolence -Very somnolent and demonstrating difficulty following commands. -No acute neurologic deficit appreciated; moving 4 limbs spontaneously. -After further discussing with palliative care service, family and healthcare power of attorney decision has been made to pursuit comfort care and symptomatic management only. -Patient has been seen by hospice who agree with patient's candidacy for residential placement; GIP has been granted and.  To hospice care will be initiated while waiting for bed availability.     Advance Care Planning:   Code Status: Do not attempt resuscitation (DNR) - Comfort care   Consults: Palliative care/hospice.  Family Communication: Wife and daughter at bedside.  Severity of Illness: The appropriate patient status for this patient is INPATIENT. Inpatient status is judged to be reasonable and necessary in order to provide the required intensity of service to ensure the patient's safety. The patient's presenting symptoms, physical exam findings, and initial radiographic and laboratory data in the context of their chronic comorbidities is felt to place them at high risk for further clinical deterioration. Furthermore, it is not anticipated that the patient will be medically stable for discharge from the hospital within 2 midnights of admission.   * I certify that at the point of admission it is my clinical judgment that the patient will require inpatient hospital care spanning beyond 2 midnights from the point of admission due to high intensity of service, high risk for further deterioration and high frequency of surveillance required.*  Author: Vassie Loll, MD 09/23/2023 7:33 PM  For on call review [email or web address].

## 2023-09-23 NOTE — TOC Progression Note (Addendum)
Transition of Care (TOC) - Progression Note    Patient Details  Name: Mark FEES Sr. MRN: [id number] Date of Birth: 1940-10-05  Transition of Care [facility]) CM/SW Contact  Villa Herb, [place] Phone Number: 09/23/2023, 11:16 AM  Clinical Narrative:    CSW spoke to [place] with [facility] who states that nurse Waynetta Sandy will arrive around lunch to assess pt for residential placement at [facility]. Lelon Mast states that they do not have any beds at [facility] at this time but if pt qualifies he can be made GIP. CSW updated Tammy with [facility] of plan for comfort measures with hospice through [place]. TOC to follow.   Addendum 1pm: CSW updated by Waynetta Sandy RN with [facility] that pt has been accepted for residential placement, however they have no beds at [facility] so pt will be made GIP here until a bed is available. Pts family has signed consents with [facility]. TOC to follow.   Expected Discharge Plan: Skilled Nursing Facility Barriers to Discharge: Continued Medical Work up  Expected Discharge Plan and Services In-house Referral: Clinical Social Work Discharge Planning Services: CM Consult Post Acute Care Choice: Skilled Nursing Facility Living arrangements for the past 2 months: Single Family Home                                       Social Determinants of Health (SDOH) Interventions SDOH Screenings   Food Insecurity: No Food Insecurity (09/16/2023)  Housing: Low Risk  (09/16/2023)  Transportation Needs: No Transportation Needs (09/16/2023)  Utilities: Not At Risk (09/16/2023)  Alcohol Screen: Low Risk  (08/22/2022)  Depression (PHQ2-9): High Risk (08/21/2023)  Financial Resource Strain: Low Risk  (08/22/2022)  Physical Activity: Inactive (08/22/2022)  Social Connections: Moderately Integrated (09/16/2023)  Stress: No Stress Concern Present (08/22/2022)  Tobacco Use: Low Risk  (09/17/2023)    Readmission Risk Interventions    05/29/2023    2:05 PM 04/18/2021   12:16 PM  04/16/2021    3:07 PM  Readmission Risk Prevention Plan  Transportation Screening Complete  Complete  HRI or Home Care Consult Complete    Social Work Consult for Recovery Care Planning/Counseling Complete    Palliative Care Screening Not Applicable    Medication Review Oceanographer) Complete  Complete  HRI or Home Care Consult   Complete  SW Recovery Care/Counseling Consult   Complete  Palliative Care Screening   Not Applicable  Skilled Nursing Facility  Not Applicable Not Complete

## 2023-09-23 NOTE — Plan of Care (Signed)
   Problem: Education: Goal: Knowledge of the prescribed therapeutic regimen will improve Outcome: Progressing   Problem: Coping: Goal: Ability to identify and develop effective coping behavior will improve Outcome: Progressing

## 2023-09-24 ENCOUNTER — Other Ambulatory Visit: Payer: Self-pay

## 2023-09-24 DIAGNOSIS — Z515 Encounter for palliative care: Secondary | ICD-10-CM | POA: Diagnosis not present

## 2023-09-24 MED ORDER — LORAZEPAM 1 MG PO TABS
1.0000 mg | ORAL_TABLET | Freq: Once | ORAL | Status: AC
  Administered 2023-09-24: 1 mg via ORAL
  Filled 2023-09-24: qty 1

## 2023-09-24 MED ORDER — HYDROCODONE-ACETAMINOPHEN 5-325 MG PO TABS
1.0000 | ORAL_TABLET | Freq: Two times a day (BID) | ORAL | Status: DC | PRN
  Administered 2023-09-24 – 2023-09-25 (×2): 1 via ORAL
  Filled 2023-09-24 (×2): qty 1

## 2023-09-24 NOTE — Consult Note (Signed)
[facility] Liaison Consult Note   09/24/2023  Mark LAPPE Sr. June 12, 1941 [id number]  *Coverage review for [name], RN [facility] Liaison for [name]  Insurance: [employer]  Primary Care Provider: Donita Brooks, MD, with [facility]   Chart reviewed  for hospitalization showing as readmission less than 30 day report and  reviewed from [facility] and Palliative consult notes and reveals the patient is currently transitioning to [name] Care currently for a Hospice facility.   Plan: Patient will have full care coordination services through Hospice and needs will be met at the hospice level of care. No planned follow up for transitional needs. Will sign off at transition from hospital.  For questions,   Charlesetta Shanks, RN, BSN, CCM [name]  [facility], [facility] Liaison Direct Dial: [phone] or secure chat Email: [name]

## 2023-09-24 NOTE — Progress Notes (Signed)
PROGRESS NOTE    [name]  ZOX:[id number] DOB: 12/10/1940 DOA: 09/23/2023 PCP: Donita Brooks, MD   Brief Narrative:    Mark Art Sr. is a 83 y.o. male with medical history significant for  ESRD on HD MWF, HTN, HLD, DM II, nephrolithiasis , hearing loss, history of palpitations/SVT presents to ED on 09/16/23--by [facility] from home with c/o weakness and difficulty swallowing for the past couple of days. He has felt like he has been choking on his food and liquid.  He was admitted for evaluation of dysphagia and treatment of Candida esophagitis and has now elected for comfort measures and is under GIP status.  Assessment & Plan:   Active Problems:   * No active hospital problems. *  Assessment and Plan:   1-dysphagia/Candida esophagitis -Worsening the last 2 weeks with associated postprandial emesis -Continue as needed analgesics and PPI -Clear liquid diets recommended by speech therapy and GI at the moment. -Follow GI service further recommendation -Endoscopic evaluation with positive Candida esophagitis; treatment with Diflucan has been started -Continue supportive care. -Speech therapy has completed MBS and has recommended dysphagia 3 with thin liquids. -Poor oral intake and further declining appreciated; after discussion with palliative care and family transitioning to comfort care and symptomatic management. -Comfort feeding will be offered.   2-end-stage renal disease -Appreciate assistance and recommendation by nephrology -Patient regular dialysis day Monday, Wednesday and Friday. -Continue to follow renal service recommendations. -Plan for short hemodialysis later today; this will be patient last hemodialysis as well transitioning into comfort care and symptomatic management only. -Continue supportive care and follow response.   3-anemia of chronic disease -No acute bleeding appreciated -IV iron and Epogen therapy as per nephrology  discretion. -Hemoglobin 7.2. -Will transition care to comfort.   4-generalized weakness -Evaluation by physical therapy appreciated; recommendation for skilled nursing facility at discharge. -Family in agreement to pursued short-term rehabilitation trial and if patient fail to improve considering transition into comfort care and quality management. -At this moment patient condition has continued to decline and further discussion has elicited decision for comfort care and symptomatic management only. -Patient is a candidate for residential hospice.   5-type 2 diabetes mellitus with nephropathy -A1c 6.5 -Patient with another episode of hypoglycemia; Long-acting insulin has already been discontinued; will adjust sliding scale insulin for very sensitive and will try not to cover unless CBGs above 200. -No further CBGs check or insulin therapy -Plan is for comfort care and symptomatic management only.   6-hypertension/SVT -Antihypertensive agents has been discontinued -Plan is for full comfort care and symptomatic management only.   7-depression/anxiety -Continue treatment with Abilify-Zoloft as much as patient able to take it. -Transition to full comfort care and will follow symptomatic management. -Patient has been accepted to hospice.   8-pressure injury -Stage II; see below for details -Continue constant repositioning and preventive measures. -POA   9-hospital-acquired delirium/somnolence -Very somnolent and demonstrating difficulty following commands. -No acute neurologic deficit appreciated; moving 4 limbs spontaneously. -After further discussing with palliative care service, family and healthcare power of attorney decision has been made to pursuit comfort care and symptomatic management only. -Patient has been seen by hospice who agree with patient's candidacy for residential placement; GIP has been granted and.  To hospice care will be initiated while waiting for bed  availability.      DVT prophylaxis: None Code Status: DNR/comfort care Family Communication: Wife at bedside 2/19   Consultants:  Palliative care  Procedures:  None  Antimicrobials:  None   Subjective: Patient seen and evaluated today with no new acute complaints or concerns. No acute concerns or events noted overnight.  Asking for something to eat.  Objective: Vitals:   09/24/23 0411 09/24/23 1500  BP: (!) 102/36   Pulse: (!) 40   Resp: 16   Temp: 98.3 F (36.8 C)   TempSrc:  Oral  SpO2: 93%   Weight:  64.9 kg  Height:  5\' 8"  (1.727 m)    Intake/Output Summary (Last 24 hours) at 09/24/2023 1512 Last data filed at 09/24/2023 0549 Gross per 24 hour  Intake 240 ml  Output 1 ml  Net 239 ml   Filed Weights   09/24/23 1500  Weight: 64.9 kg    Examination:  General exam: Appears calm and comfortable  Respiratory system: Clear to auscultation. Respiratory effort normal. Cardiovascular system: S1 & S2 heard, RRR.  Gastrointestinal system: Abdomen is soft Central nervous system: Alert and awake Extremities: No edema Skin: No significant lesions noted Psychiatry: Flat affect.    Data Reviewed: I have personally reviewed following labs and imaging studies  CBC: Recent Labs  Lab 09/19/23 1441 09/22/23 1056  WBC 9.6 8.5  HGB 7.1* 7.7*  HCT 22.5* 25.3*  MCV 104.7* 105.4*  PLT 107* 132*   Basic Metabolic Panel: Recent Labs  Lab 09/19/23 1441 09/22/23 1056  NA 129* 131*  K 3.5 3.3*  CL 95* 96*  CO2 23 24  GLUCOSE 190* 88  BUN 37* 26*  CREATININE 3.45* 3.72*  CALCIUM 6.8* 7.4*  PHOS 3.8 4.0   GFR: Estimated Creatinine Clearance: 14.1 mL/min (A) (by C-G formula based on SCr of 3.72 mg/dL (H)). Liver Function Tests: Recent Labs  Lab 09/19/23 1441 09/22/23 1056  ALBUMIN 1.6* 1.7*   No results for input(s): "LIPASE", "AMYLASE" in the last 168 hours. No results for input(s): "AMMONIA" in the last 168 hours. Coagulation Profile: No results  for input(s): "INR", "PROTIME" in the last 168 hours. Cardiac Enzymes: No results for input(s): "CKTOTAL", "CKMB", "CKMBINDEX", "TROPONINI" in the last 168 hours. BNP (last 3 results) No results for input(s): "PROBNP" in the last 8760 hours. HbA1C: No results for input(s): "HGBA1C" in the last 72 hours. CBG: Recent Labs  Lab 09/22/23 0820 09/22/23 0849 09/22/23 0954 09/22/23 1115 09/22/23 1619  GLUCAP 100* 88 97 86 68*   Lipid Profile: No results for input(s): "CHOL", "HDL", "LDLCALC", "TRIG", "CHOLHDL", "LDLDIRECT" in the last 72 hours. Thyroid Function Tests: No results for input(s): "TSH", "T4TOTAL", "FREET4", "T3FREE", "THYROIDAB" in the last 72 hours. Anemia Panel: No results for input(s): "VITAMINB12", "FOLATE", "FERRITIN", "TIBC", "IRON", "RETICCTPCT" in the last 72 hours. Sepsis Labs: No results for input(s): "PROCALCITON", "LATICACIDVEN" in the last 168 hours.  Recent Results (from the past 240 hours)  Resp panel by RT-PCR (RSV, Flu A&B, Covid) Anterior Nasal Swab     Status: None   Collection Time: 09/16/23 11:10 AM   Specimen: Anterior Nasal Swab  Result Value Ref Range Status   SARS Coronavirus 2 by RT PCR NEGATIVE NEGATIVE Final    Comment: (NOTE) SARS-CoV-2 target nucleic acids are NOT DETECTED.  The SARS-CoV-2 RNA is generally detectable in upper respiratory specimens during the acute phase of infection. The lowest concentration of SARS-CoV-2 viral copies this assay can detect is 138 copies/mL. A negative result does not preclude SARS-Cov-2 infection and should not be used as the sole basis for treatment or other patient management decisions. A negative result may occur with  improper specimen  collection/handling, submission of specimen other than nasopharyngeal swab, presence of viral mutation(s) within the areas targeted by this assay, and inadequate number of viral copies(<138 copies/mL). A negative result must be combined with clinical observations,  patient history, and epidemiological information. The expected result is Negative.  Fact Sheet for Patients:  BloggerCourse.com  Fact Sheet for Healthcare Providers:  SeriousBroker.it  This test is no t yet approved or cleared by the [place] FDA and  has been authorized for detection and/or diagnosis of SARS-CoV-2 by FDA under an Emergency Use Authorization (EUA). This EUA will remain  in effect (meaning this test can be used) for the duration of the COVID-19 declaration under Section 564(b)(1) of the Act, 21 U.S.C.section 360bbb-3(b)(1), unless the authorization is terminated  or revoked sooner.       Influenza A by PCR NEGATIVE NEGATIVE Final   Influenza B by PCR NEGATIVE NEGATIVE Final    Comment: (NOTE) The Xpert Xpress SARS-CoV-2/FLU/RSV plus assay is intended as an aid in the diagnosis of influenza from Nasopharyngeal swab specimens and should not be used as a sole basis for treatment. Nasal washings and aspirates are unacceptable for Xpert Xpress SARS-CoV-2/FLU/RSV testing.  Fact Sheet for Patients: BloggerCourse.com  Fact Sheet for Healthcare Providers: SeriousBroker.it  This test is not yet approved or cleared by the [place] FDA and has been authorized for detection and/or diagnosis of SARS-CoV-2 by FDA under an Emergency Use Authorization (EUA). This EUA will remain in effect (meaning this test can be used) for the duration of the COVID-19 declaration under Section 564(b)(1) of the Act, 21 U.S.C. section 360bbb-3(b)(1), unless the authorization is terminated or revoked.     Resp Syncytial Virus by PCR NEGATIVE NEGATIVE Final    Comment: (NOTE) Fact Sheet for Patients: BloggerCourse.com  Fact Sheet for Healthcare Providers: SeriousBroker.it  This test is not yet approved or cleared by the [place] FDA and has been authorized for detection and/or diagnosis of SARS-CoV-2 by FDA under an Emergency Use Authorization (EUA). This EUA will remain in effect (meaning this test can be used) for the duration of the COVID-19 declaration under Section 564(b)(1) of the Act, 21 U.S.C. section 360bbb-3(b)(1), unless the authorization is terminated or revoked.  Performed at [facility], [address], [place], [place] [address]   [facility] prep     Status: None   Collection Time: 09/17/23  1:07 PM   Specimen: PATH Cytology brushing; Body Fluid  Result Value Ref Range Status   Specimen Description ESOPHAGUS  Final   Special Requests NONE  Final   KOH Prep   Final    BUDDING YEAST SEEN YEAST WITH PSEUDOHYPHAE Performed at [facility], [address], [place], [place] [address]    Report Status 09/17/2023 FINAL  Final         Radiology Studies: No results found.      Scheduled Meds:  sodium chloride flush  3 mL Intravenous Q12H     LOS: 1 day    Time spent: 35 minutes    Alya Smaltz Hoover Brunette, DO [facility]  If 7PM-7AM, please contact night-coverage [email or web address] 09/24/2023, 3:12 PM

## 2023-09-24 NOTE — Progress Notes (Signed)
Palliative: Mr. Klemann is lying quietly in bed.  He appears acutely/chronically ill and quite frail.  He greets me, making and mostly keeping eye contact.  He is alert and oriented, able to make his basic needs known.  His wife, Agustin Cree, is present at bedside along with his stepdaughter and her husband.   Mr. Macgowan tells me that he has been enjoying some fried chicken and milkshake.  We talked about the importance of enjoying what he can.  He requests Cola which is provided.  We talked about symptom management.  I encouraged him to ask for pain and anxiety medications as needed.  Conference with attending, bedside nursing staff, transition of care team related to patient condition, needs, goals of care, disposition.  Plan:   Requesting comfort and dignity at end-of-life, residential hospice at [facility].  Mr. Desa has been accepted to [facility] but there is no bed at this time he is at [facility] under GIP status.  End-of-life order set in place. Prognosis:     5 to 10 days anticipated  35 minutes  Lillia Carmel, NP Palliative medicine team Team phone [phone]

## 2023-09-25 ENCOUNTER — Encounter: Payer: Self-pay | Admitting: Internal Medicine

## 2023-09-25 ENCOUNTER — Encounter: Payer: PPO | Admitting: Student in an Organized Health Care Education/Training Program

## 2023-09-25 DIAGNOSIS — Z515 Encounter for palliative care: Secondary | ICD-10-CM | POA: Diagnosis not present

## 2023-09-25 NOTE — Progress Notes (Signed)
Palliative:    Comfort and dignity at end-of-life, requesting residential hospice at [facility].  Unfortunately, [facility] does not have a bed to offer.  Mr. Vanderweele will remain at [facility] under comfort care.  End-of-life order set in place.  Conference with attending, bedside nursing staff, transition of care team related to patient condition, needs, goals of care, disposition.  Plan: Comfort measures only started 2/18.  Comfort and dignity at end-of-life, requesting residential hospice at [facility].  Unfortunately, [facility] does not have a bed to offer.  Mr. Dunklee will remain at [facility] under comfort care.  End-of-life order set in place.  No charge  Lillia Carmel, NP Palliative Medicine Team  Team Phone [phone]

## 2023-09-25 NOTE — Progress Notes (Signed)
Patient has rested well and is anticipating discharge to [facility].  Patient has no iv access and has refused iv at this time.  No acute events overnight.

## 2023-09-25 NOTE — TOC Progression Note (Signed)
Transition of Care (TOC) - Progression Note    Patient Details  Name: Mark CROSS Sr. MRN: [id number] Date of Birth: Nov 28, 1940  Transition of Care [facility]) CM/SW Contact  [place] Her[place], [place] Phone Number: 09/25/2023, 10:34 AM  Clinical Narrative:    CSW spoke to [place] with Hospice who states that [facility] does not have any beds available at this time. TOC to follow.    Barriers to Discharge: Hospice Bed not available  Expected Discharge Plan and Services                                               Social Determinants of Health (SDOH) Interventions SDOH Screenings   Food Insecurity: No Food Insecurity (09/24/2023)  Housing: Low Risk  (09/24/2023)  Transportation Needs: Patient Unable To Answer (09/24/2023)  Utilities: Patient Unable To Answer (09/24/2023)  Alcohol Screen: Low Risk  (08/22/2022)  Depression (PHQ2-9): High Risk (08/21/2023)  Financial Resource Strain: Low Risk  (08/22/2022)  Physical Activity: Inactive (08/22/2022)  Social Connections: Patient Unable To Answer (09/24/2023)  Stress: No Stress Concern Present (08/22/2022)  Tobacco Use: Low Risk  (09/17/2023)    Readmission Risk Interventions    05/29/2023    2:05 PM 04/18/2021   12:16 PM 04/16/2021    3:07 PM  Readmission Risk Prevention Plan  Transportation Screening Complete  Complete  HRI or Home Care Consult Complete    Social Work Consult for Recovery Care Planning/Counseling Complete    Palliative Care Screening Not Applicable    Medication Review Oceanographer) Complete  Complete  HRI or Home Care Consult   Complete  SW Recovery Care/Counseling Consult   Complete  Palliative Care Screening   Not Applicable  Skilled Nursing Facility  Not Applicable Not Complete

## 2023-09-25 NOTE — Progress Notes (Signed)
PROGRESS NOTE    [name]  ZOX:[id number] DOB: [birth year] DOA: 09/23/2023 PCP: Donita Brooks, MD   Brief Narrative:    Mark Art Sr. is a 83 y.o. male with medical history significant for  ESRD on HD MWF, HTN, HLD, DM II, nephrolithiasis , hearing loss, history of palpitations/SVT presents to ED on 09/16/23--by [facility] from home with c/o weakness and difficulty swallowing for the past couple of days. He has felt like he has been choking on his food and liquid.  He was admitted for evaluation of dysphagia and treatment of Candida esophagitis and has now elected for comfort measures and is under GIP status.  Assessment & Plan:   Active Problems:   * No active hospital problems. *  Assessment and Plan:   1-dysphagia/Candida esophagitis -Worsening the last 2 weeks with associated postprandial emesis -Continue as needed analgesics and PPI -Clear liquid diets recommended by speech therapy and GI at the moment. -Follow GI service further recommendation -Endoscopic evaluation with positive Candida esophagitis; treatment with Diflucan has been started -Continue supportive care. -Speech therapy has completed MBS and has recommended dysphagia 3 with thin liquids. -Poor oral intake and further declining appreciated; after discussion with palliative care and family transitioning to comfort care and symptomatic management. -Comfort feeding will be offered.   2-end-stage renal disease -Appreciate assistance and recommendation by nephrology -Patient regular dialysis day Monday, Wednesday and Friday. -Continue to follow renal service recommendations. -Plan for short hemodialysis later today; this will be patient last hemodialysis as well transitioning into comfort care and symptomatic management only. -Continue supportive care and follow response.   3-anemia of chronic disease -No acute bleeding appreciated -IV iron and Epogen therapy as per nephrology  discretion. -Hemoglobin 7.2. -Will transition care to comfort.   4-generalized weakness -Evaluation by physical therapy appreciated; recommendation for skilled nursing facility at discharge. -Family in agreement to pursued short-term rehabilitation trial and if patient fail to improve considering transition into comfort care and quality management. -At this moment patient condition has continued to decline and further discussion has elicited decision for comfort care and symptomatic management only. -Patient is a candidate for residential hospice.   5-type 2 diabetes mellitus with nephropathy -A1c 6.5 -Patient with another episode of hypoglycemia; Long-acting insulin has already been discontinued; will adjust sliding scale insulin for very sensitive and will try not to cover unless CBGs above 200. -No further CBGs check or insulin therapy -Plan is for comfort care and symptomatic management only.   6-hypertension/SVT -Antihypertensive agents has been discontinued -Plan is for full comfort care and symptomatic management only.   7-depression/anxiety -Continue treatment with Abilify-Zoloft as much as patient able to take it. -Transition to full comfort care and will follow symptomatic management. -Patient has been accepted to hospice.   8-pressure injury -Stage II; see below for details -Continue constant repositioning and preventive measures. -POA   9-hospital-acquired delirium/somnolence -Very somnolent and demonstrating difficulty following commands. -No acute neurologic deficit appreciated; moving 4 limbs spontaneously. -After further discussing with palliative care service, family and healthcare power of attorney decision has been made to pursuit comfort care and symptomatic management only. -Patient has been seen by hospice who agree with patient's candidacy for residential placement; GIP has been granted and.  To hospice care will be initiated while waiting for bed  availability.      DVT prophylaxis: None Code Status: DNR/comfort care Family Communication: Wife at bedside 2/19   Consultants:  Palliative care  Procedures:  None  Antimicrobials:  None   Subjective: Patient seen and evaluated today with no new acute complaints or concerns. No acute concerns or events noted overnight.  Asking for something to eat.  Objective: Vitals:   09/24/23 0411 09/24/23 1500 09/24/23 2000 09/25/23 0400  BP: (!) 102/36  (!) 116/51 (!) 124/35  Pulse: (!) 40  81 82  Resp: 16  16 16   Temp: 98.3 F (36.8 C)  98.1 F (36.7 C)   TempSrc:  Oral    SpO2: 93%  96% 97%  Weight:  64.9 kg    Height:  5\' 8"  (1.727 m)      Intake/Output Summary (Last 24 hours) at 09/25/2023 0926 Last data filed at 09/25/2023 2956 Gross per 24 hour  Intake 240 ml  Output --  Net 240 ml   Filed Weights   09/24/23 1500  Weight: 64.9 kg    Examination:  General exam: Appears calm and comfortable  Respiratory system: Clear to auscultation. Respiratory effort normal. Cardiovascular system: S1 & S2 heard, RRR.  Gastrointestinal system: Abdomen is soft Central nervous system: Alert and awake Extremities: No edema Skin: No significant lesions noted Psychiatry: Flat affect.    Data Reviewed: I have personally reviewed following labs and imaging studies  CBC: Recent Labs  Lab 09/19/23 1441 09/22/23 1056  WBC 9.6 8.5  HGB 7.1* 7.7*  HCT 22.5* 25.3*  MCV 104.7* 105.4*  PLT 107* 132*   Basic Metabolic Panel: Recent Labs  Lab 09/19/23 1441 09/22/23 1056  NA 129* 131*  K 3.5 3.3*  CL 95* 96*  CO2 23 24  GLUCOSE 190* 88  BUN 37* 26*  CREATININE 3.45* 3.72*  CALCIUM 6.8* 7.4*  PHOS 3.8 4.0   GFR: Estimated Creatinine Clearance: 14.1 mL/min (A) (by C-G formula based on SCr of 3.72 mg/dL (H)). Liver Function Tests: Recent Labs  Lab 09/19/23 1441 09/22/23 1056  ALBUMIN 1.6* 1.7*   No results for input(s): "LIPASE", "AMYLASE" in the last 168  hours. No results for input(s): "AMMONIA" in the last 168 hours. Coagulation Profile: No results for input(s): "INR", "PROTIME" in the last 168 hours. Cardiac Enzymes: No results for input(s): "CKTOTAL", "CKMB", "CKMBINDEX", "TROPONINI" in the last 168 hours. BNP (last 3 results) No results for input(s): "PROBNP" in the last 8760 hours. HbA1C: No results for input(s): "HGBA1C" in the last 72 hours. CBG: Recent Labs  Lab 09/22/23 0820 09/22/23 0849 09/22/23 0954 09/22/23 1115 09/22/23 1619  GLUCAP 100* 88 97 86 68*   Lipid Profile: No results for input(s): "CHOL", "HDL", "LDLCALC", "TRIG", "CHOLHDL", "LDLDIRECT" in the last 72 hours. Thyroid Function Tests: No results for input(s): "TSH", "T4TOTAL", "FREET4", "T3FREE", "THYROIDAB" in the last 72 hours. Anemia Panel: No results for input(s): "VITAMINB12", "FOLATE", "FERRITIN", "TIBC", "IRON", "RETICCTPCT" in the last 72 hours. Sepsis Labs: No results for input(s): "PROCALCITON", "LATICACIDVEN" in the last 168 hours.  Recent Results (from the past 240 hours)  Resp panel by RT-PCR (RSV, Flu A&B, Covid) Anterior Nasal Swab     Status: None   Collection Time: 09/16/23 11:10 AM   Specimen: Anterior Nasal Swab  Result Value Ref Range Status   SARS Coronavirus 2 by RT PCR NEGATIVE NEGATIVE Final    Comment: (NOTE) SARS-CoV-2 target nucleic acids are NOT DETECTED.  The SARS-CoV-2 RNA is generally detectable in upper respiratory specimens during the acute phase of infection. The lowest concentration of SARS-CoV-2 viral copies this assay can detect is 138 copies/mL. A negative result does not preclude SARS-Cov-2 infection and  should not be used as the sole basis for treatment or other patient management decisions. A negative result may occur with  improper specimen collection/handling, submission of specimen other than nasopharyngeal swab, presence of viral mutation(s) within the areas targeted by this assay, and inadequate number  of viral copies(<138 copies/mL). A negative result must be combined with clinical observations, patient history, and epidemiological information. The expected result is Negative.  Fact Sheet for Patients:  BloggerCourse.com  Fact Sheet for Healthcare Providers:  SeriousBroker.it  This test is no t yet approved or cleared by the [place] FDA and  has been authorized for detection and/or diagnosis of SARS-CoV-2 by FDA under an Emergency Use Authorization (EUA). This EUA will remain  in effect (meaning this test can be used) for the duration of the COVID-19 declaration under Section 564(b)(1) of the Act, 21 U.S.C.section 360bbb-3(b)(1), unless the authorization is terminated  or revoked sooner.       Influenza A by PCR NEGATIVE NEGATIVE Final   Influenza B by PCR NEGATIVE NEGATIVE Final    Comment: (NOTE) The Xpert Xpress SARS-CoV-2/FLU/RSV plus assay is intended as an aid in the diagnosis of influenza from Nasopharyngeal swab specimens and should not be used as a sole basis for treatment. Nasal washings and aspirates are unacceptable for Xpert Xpress SARS-CoV-2/FLU/RSV testing.  Fact Sheet for Patients: BloggerCourse.com  Fact Sheet for Healthcare Providers: SeriousBroker.it  This test is not yet approved or cleared by the [place] FDA and has been authorized for detection and/or diagnosis of SARS-CoV-2 by FDA under an Emergency Use Authorization (EUA). This EUA will remain in effect (meaning this test can be used) for the duration of the COVID-19 declaration under Section 564(b)(1) of the Act, 21 U.S.C. section 360bbb-3(b)(1), unless the authorization is terminated or revoked.     Resp Syncytial Virus by PCR NEGATIVE NEGATIVE Final    Comment: (NOTE) Fact Sheet for Patients: BloggerCourse.com  Fact Sheet for Healthcare  Providers: SeriousBroker.it  This test is not yet approved or cleared by the [place] FDA and has been authorized for detection and/or diagnosis of SARS-CoV-2 by FDA under an Emergency Use Authorization (EUA). This EUA will remain in effect (meaning this test can be used) for the duration of the COVID-19 declaration under Section 564(b)(1) of the Act, 21 U.S.C. section 360bbb-3(b)(1), unless the authorization is terminated or revoked.  Performed at [facility], [address], [place], [place] [address]   [facility] prep     Status: None   Collection Time: 09/17/23  1:07 PM   Specimen: PATH Cytology brushing; Body Fluid  Result Value Ref Range Status   Specimen Description ESOPHAGUS  Final   Special Requests NONE  Final   KOH Prep   Final    BUDDING YEAST SEEN YEAST WITH PSEUDOHYPHAE Performed at [facility], [address] [place], [place] [address]    Report Status 09/17/2023 FINAL  Final         Radiology Studies: No results found.      Scheduled Meds:  sodium chloride flush  3 mL Intravenous Q12H     LOS: 2 days    Time spent: 35 minutes    Mark Bertram Hoover Brunette, DO [facility]  If 7PM-7AM, please contact night-coverage [email or web address] 09/25/2023, 9:26 AM

## 2023-09-26 DIAGNOSIS — Z515 Encounter for palliative care: Secondary | ICD-10-CM | POA: Diagnosis not present

## 2023-09-26 MED ORDER — HYDROCODONE-ACETAMINOPHEN 5-325 MG PO TABS
1.0000 | ORAL_TABLET | ORAL | Status: DC | PRN
  Administered 2023-09-26: 1 via ORAL
  Filled 2023-09-26: qty 1

## 2023-09-26 NOTE — Progress Notes (Signed)
PROGRESS NOTE    [name]  ZOX:[id number] DOB: [birth year] DOA: 09/23/2023 PCP: Donita Brooks, MD   Brief Narrative:    Mark Art Sr. is a 83 y.o. male with medical history significant for  ESRD on HD MWF, HTN, HLD, DM II, nephrolithiasis , hearing loss, history of palpitations/SVT presents to ED on 09/16/23--by [facility] from home with c/o weakness and difficulty swallowing for the past couple of days. He has felt like he has been choking on his food and liquid.  He was admitted for evaluation of dysphagia and treatment of Candida esophagitis and has now elected for comfort measures and is under GIP status.  Assessment & Plan:   Active Problems:   * No active hospital problems. *  Assessment and Plan:   1-dysphagia/Candida esophagitis -Worsening the last 2 weeks with associated postprandial emesis -Continue as needed analgesics and PPI -Clear liquid diets recommended by speech therapy and GI at the moment. -Follow GI service further recommendation -Endoscopic evaluation with positive Candida esophagitis; treatment with Diflucan has been started -Continue supportive care. -Speech therapy has completed MBS and has recommended dysphagia 3 with thin liquids. -Poor oral intake and further declining appreciated; after discussion with palliative care and family transitioning to comfort care and symptomatic management. -Comfort feeding will be offered.   2-end-stage renal disease -Appreciate assistance and recommendation by nephrology -Patient regular dialysis day Monday, Wednesday and Friday. -Continue to follow renal service recommendations. -Plan for short hemodialysis later today; this will be patient last hemodialysis as well transitioning into comfort care and symptomatic management only. -Continue supportive care and follow response.   3-anemia of chronic disease -No acute bleeding appreciated -IV iron and Epogen therapy as per nephrology  discretion. -Hemoglobin 7.2. -Will transition care to comfort.   4-generalized weakness -Evaluation by physical therapy appreciated; recommendation for skilled nursing facility at discharge. -Family in agreement to pursued short-term rehabilitation trial and if patient fail to improve considering transition into comfort care and quality management. -At this moment patient condition has continued to decline and further discussion has elicited decision for comfort care and symptomatic management only. -Patient is a candidate for residential hospice.   5-type 2 diabetes mellitus with nephropathy -A1c 6.5 -Patient with another episode of hypoglycemia; Long-acting insulin has already been discontinued; will adjust sliding scale insulin for very sensitive and will try not to cover unless CBGs above 200. -No further CBGs check or insulin therapy -Plan is for comfort care and symptomatic management only.   6-hypertension/SVT -Antihypertensive agents has been discontinued -Plan is for full comfort care and symptomatic management only.   7-depression/anxiety -Continue treatment with Abilify-Zoloft as much as patient able to take it. -Transition to full comfort care and will follow symptomatic management. -Patient has been accepted to hospice.   8-pressure injury -Stage II; see below for details -Continue constant repositioning and preventive measures. -POA   9-hospital-acquired delirium/somnolence -Very somnolent and demonstrating difficulty following commands. -No acute neurologic deficit appreciated; moving 4 limbs spontaneously. -After further discussing with palliative care service, family and healthcare power of attorney decision has been made to pursuit comfort care and symptomatic management only. -Patient has been seen by hospice who agree with patient's candidacy for residential placement; GIP has been granted and.  To hospice care will be initiated while waiting for bed  availability.      DVT prophylaxis: None Code Status: DNR/comfort care Family Communication: Wife at bedside 2/19   Consultants:  Palliative care  Procedures:  None  Antimicrobials:  None   Subjective: Patient seen and evaluated today with no new acute complaints or concerns. No acute concerns or events noted overnight.  Asking for something to eat.  Objective: Vitals:   09/24/23 1500 09/24/23 2000 09/25/23 0400 09/26/23 0437  BP:  (!) 116/51 (!) 124/35 (!) 114/48  Pulse:  81 82 (!) 46  Resp:  16 16 18   Temp:  98.1 F (36.7 C)  98.2 F (36.8 C)  TempSrc: Oral     SpO2:  96% 97% 93%  Weight: 64.9 kg     Height: 5\' 8"  (1.727 m)       Intake/Output Summary (Last 24 hours) at 09/26/2023 0930 Last data filed at 09/26/2023 0815 Gross per 24 hour  Intake 480 ml  Output --  Net 480 ml   Filed Weights   09/24/23 1500  Weight: 64.9 kg    Examination:  General exam: Appears calm and comfortable  Respiratory system: Clear to auscultation. Respiratory effort normal. Cardiovascular system: S1 & S2 heard, RRR.  Gastrointestinal system: Abdomen is soft Central nervous system: Alert and awake Extremities: No edema Skin: No significant lesions noted Psychiatry: Flat affect.    Data Reviewed: I have personally reviewed following labs and imaging studies  CBC: Recent Labs  Lab 09/19/23 1441 09/22/23 1056  WBC 9.6 8.5  HGB 7.1* 7.7*  HCT 22.5* 25.3*  MCV 104.7* 105.4*  PLT 107* 132*   Basic Metabolic Panel: Recent Labs  Lab 09/19/23 1441 09/22/23 1056  NA 129* 131*  K 3.5 3.3*  CL 95* 96*  CO2 23 24  GLUCOSE 190* 88  BUN 37* 26*  CREATININE 3.45* 3.72*  CALCIUM 6.8* 7.4*  PHOS 3.8 4.0   GFR: Estimated Creatinine Clearance: 14.1 mL/min (A) (by C-G formula based on SCr of 3.72 mg/dL (H)). Liver Function Tests: Recent Labs  Lab 09/19/23 1441 09/22/23 1056  ALBUMIN 1.6* 1.7*   No results for input(s): "LIPASE", "AMYLASE" in the last 168  hours. No results for input(s): "AMMONIA" in the last 168 hours. Coagulation Profile: No results for input(s): "INR", "PROTIME" in the last 168 hours. Cardiac Enzymes: No results for input(s): "CKTOTAL", "CKMB", "CKMBINDEX", "TROPONINI" in the last 168 hours. BNP (last 3 results) No results for input(s): "PROBNP" in the last 8760 hours. HbA1C: No results for input(s): "HGBA1C" in the last 72 hours. CBG: Recent Labs  Lab 09/22/23 0820 09/22/23 0849 09/22/23 0954 09/22/23 1115 09/22/23 1619  GLUCAP 100* 88 97 86 68*   Lipid Profile: No results for input(s): "CHOL", "HDL", "LDLCALC", "TRIG", "CHOLHDL", "LDLDIRECT" in the last 72 hours. Thyroid Function Tests: No results for input(s): "TSH", "T4TOTAL", "FREET4", "T3FREE", "THYROIDAB" in the last 72 hours. Anemia Panel: No results for input(s): "VITAMINB12", "FOLATE", "FERRITIN", "TIBC", "IRON", "RETICCTPCT" in the last 72 hours. Sepsis Labs: No results for input(s): "PROCALCITON", "LATICACIDVEN" in the last 168 hours.  Recent Results (from the past 240 hours)  Resp panel by RT-PCR (RSV, Flu A&B, Covid) Anterior Nasal Swab     Status: None   Collection Time: 09/16/23 11:10 AM   Specimen: Anterior Nasal Swab  Result Value Ref Range Status   SARS Coronavirus 2 by RT PCR NEGATIVE NEGATIVE Final    Comment: (NOTE) SARS-CoV-2 target nucleic acids are NOT DETECTED.  The SARS-CoV-2 RNA is generally detectable in upper respiratory specimens during the acute phase of infection. The lowest concentration of SARS-CoV-2 viral copies this assay can detect is 138 copies/mL. A negative result does not preclude SARS-Cov-2 infection and  should not be used as the sole basis for treatment or other patient management decisions. A negative result may occur with  improper specimen collection/handling, submission of specimen other than nasopharyngeal swab, presence of viral mutation(s) within the areas targeted by this assay, and inadequate number  of viral copies(<138 copies/mL). A negative result must be combined with clinical observations, patient history, and epidemiological information. The expected result is Negative.  Fact Sheet for Patients:  BloggerCourse.com  Fact Sheet for Healthcare Providers:  SeriousBroker.it  This test is no t yet approved or cleared by the [place] FDA and  has been authorized for detection and/or diagnosis of SARS-CoV-2 by FDA under an Emergency Use Authorization (EUA). This EUA will remain  in effect (meaning this test can be used) for the duration of the COVID-19 declaration under Section 564(b)(1) of the Act, 21 U.S.C.section 360bbb-3(b)(1), unless the authorization is terminated  or revoked sooner.       Influenza A by PCR NEGATIVE NEGATIVE Final   Influenza B by PCR NEGATIVE NEGATIVE Final    Comment: (NOTE) The Xpert Xpress SARS-CoV-2/FLU/RSV plus assay is intended as an aid in the diagnosis of influenza from Nasopharyngeal swab specimens and should not be used as a sole basis for treatment. Nasal washings and aspirates are unacceptable for Xpert Xpress SARS-CoV-2/FLU/RSV testing.  Fact Sheet for Patients: BloggerCourse.com  Fact Sheet for Healthcare Providers: SeriousBroker.it  This test is not yet approved or cleared by the [place] FDA and has been authorized for detection and/or diagnosis of SARS-CoV-2 by FDA under an Emergency Use Authorization (EUA). This EUA will remain in effect (meaning this test can be used) for the duration of the COVID-19 declaration under Section 564(b)(1) of the Act, 21 U.S.C. section 360bbb-3(b)(1), unless the authorization is terminated or revoked.     Resp Syncytial Virus by PCR NEGATIVE NEGATIVE Final    Comment: (NOTE) Fact Sheet for Patients: BloggerCourse.com  Fact Sheet for Healthcare  Providers: SeriousBroker.it  This test is not yet approved or cleared by the [place] FDA and has been authorized for detection and/or diagnosis of SARS-CoV-2 by FDA under an Emergency Use Authorization (EUA). This EUA will remain in effect (meaning this test can be used) for the duration of the COVID-19 declaration under Section 564(b)(1) of the Act, 21 U.S.C. section 360bbb-3(b)(1), unless the authorization is terminated or revoked.  Performed at [facility], [address] [place], [place] [address]   [facility] prep     Status: None   Collection Time: 09/17/23  1:07 PM   Specimen: PATH Cytology brushing; Body Fluid  Result Value Ref Range Status   Specimen Description ESOPHAGUS  Final   Special Requests NONE  Final   KOH Prep   Final    BUDDING YEAST SEEN YEAST WITH PSEUDOHYPHAE Performed at [facility], [address] [place], [place] [address]    Report Status 09/17/2023 FINAL  Final         Radiology Studies: No results found.      Scheduled Meds:  sodium chloride flush  3 mL Intravenous Q12H     LOS: 3 days    Time spent: 35 minutes    Devonta Blanford Hoover Brunette, DO [facility]  If 7PM-7AM, please contact night-coverage [email or web address] 09/26/2023, 9:30 AM

## 2023-09-26 NOTE — TOC Transition Note (Signed)
Transition of Care [facility]) - Discharge Note   Patient Details  Name: Mark MCTIGUE Sr. MRN: [id number] Date of Birth: 1940-09-04  Transition of Care [facility]) CM/SW Contact:  Villa Herb, LCSWA Phone Number: 09/26/2023, 10:05 AM  Clinical Narrative:    CSW updated by Rae Halsted with [facility] who states that they have a bed open at [facility] for pt. CSW updated MD that D/C can be completed. CSW provided RN with report number. Rae Halsted states they will update pts spouse/daughter. EMS set up by [facility]. TOC signing off.   Final next level of care: Hospice Medical Facility Barriers to Discharge: Barriers Resolved   Patient Goals and CMS Choice Patient states their goals for this hospitalization and ongoing recovery are:: [facility] to [facility].gov Compare Post Acute Care list provided to:: Patient Represenative (must comment) Choice offered to / list presented to : Spouse, Patient      Discharge Placement                Patient to be transferred to facility by: EMS Name of family member notified: Wife Patient and family notified of of transfer: 09/26/23  Discharge Plan and Services Additional resources added to the After Visit Summary for                                       Social Drivers of Health (SDOH) Interventions SDOH Screenings   Food Insecurity: No Food Insecurity (09/24/2023)  Housing: Low Risk  (09/24/2023)  Transportation Needs: Patient Unable To Answer (09/24/2023)  Utilities: Patient Unable To Answer (09/24/2023)  Alcohol Screen: Low Risk  (08/22/2022)  Depression (PHQ2-9): High Risk (08/21/2023)  Financial Resource Strain: Low Risk  (08/22/2022)  Physical Activity: Inactive (08/22/2022)  Social Connections: Patient Unable To Answer (09/24/2023)  Stress: No Stress Concern Present (08/22/2022)  Tobacco Use: Low Risk  (09/17/2023)     Readmission Risk Interventions    09/26/2023   10:05 AM 05/29/2023    2:05 PM 04/18/2021   12:16 PM  Readmission  Risk Prevention Plan  Transportation Screening Complete Complete   HRI or Home Care Consult Complete Complete   Social Work Consult for Recovery Care Planning/Counseling Complete Complete   Palliative Care Screening Complete Not Applicable   Medication Review Oceanographer) Complete Complete   Skilled Nursing Facility   Not Applicable

## 2023-09-26 NOTE — Discharge Summary (Signed)
Physician Discharge Summary  [name] ZOX:[id number] DOB: 25-Aug-1940 DOA: 09/23/2023  PCP: Donita Brooks, MD  Admit date: 09/23/2023  Discharge date: 09/26/2023  Admitted From:Home  Disposition:  Hospice Home  Recommendations for Outpatient Follow-up:  Follow up with Hospice  Home Health:None  Equipment/Devices:None  Discharge Condition:Stable  CODE STATUS: DNR/Comfort Care  Diet recommendation: Regular/pleasure feeds  Brief/Interim Summary: Mark Art Sr. is a 83 y.o. male with medical history significant for  ESRD on HD MWF, HTN, HLD, DM II, nephrolithiasis , hearing loss, history of palpitations/SVT presents to ED on 09/16/23--by [facility] from home with c/o weakness and difficulty swallowing for the past couple of days. He has felt like he has been choking on his food and liquid.  He was admitted for evaluation of dysphagia and treatment of Candida esophagitis and has now elected for comfort measures and is under GIP status.    Discharge Instructions  Discharge Instructions     Diet - low sodium heart healthy   Complete by: As directed    If the dressing is still on your incision site when you go home, remove it on the third day after your surgery date. Remove dressing if it begins to fall off, or if it is dirty or damaged before the third day.   Complete by: As directed    Increase activity slowly   Complete by: As directed       Allergies as of 09/26/2023       Reactions   Enalapril Maleate Cough        Medication List     STOP taking these medications    acetaminophen 500 MG tablet Commonly known as: TYLENOL   ARIPiprazole 2 MG tablet Commonly known as: Abilify   diazepam 2 MG tablet Commonly known as: VALIUM   diltiazem 240 MG 24 hr capsule Commonly known as: CARDIZEM CD   FreeStyle Libre 3 Plus Sensor Misc   HYDROcodone-acetaminophen 5-325 MG tablet Commonly known as: NORCO/VICODIN   insulin aspart 100 UNIT/ML  injection Commonly known as: novoLOG   insulin glargine 100 UNIT/ML injection Commonly known as: LANTUS   lidocaine-prilocaine cream Commonly known as: EMLA   pantoprazole 40 MG tablet Commonly known as: PROTONIX   pravastatin 10 MG tablet Commonly known as: PRAVACHOL   sertraline 50 MG tablet Commonly known as: ZOLOFT   sevelamer carbonate 800 MG tablet Commonly known as: RENVELA               Discharge Care Instructions  (From admission, onward)           Start     Ordered   09/26/23 0000  If the dressing is still on your incision site when you go home, remove it on the third day after your surgery date. Remove dressing if it begins to fall off, or if it is dirty or damaged before the third day.        09/26/23 1022            Follow-up Information     Donita Brooks, MD .   Specialty: [facility] Medicine Contact information: [address] [place] [address] [phone]                Allergies  Allergen Reactions   Enalapril Maleate Cough    Consultations: Palliative Care   Procedures/Studies: DG Swallowing Func-Speech Pathology Result Date: 09/19/2023 Table formatting from the original result was not included. Modified Barium Swallow Study Patient  Details Name: Mark TILLMAN Sr. MRN: [id number] Date of Birth: 10-25-1940 Today's Date: 09/19/2023 HPI/PMH: HPI: Mark Donahue. is a 83 y.o. male with medical history significant for  ESRD on HD MWF, HTN, HLD, DM II, nephrolithiasis , hearing loss, history of palpitations/SVT presents to ED on 09/16/23--by [facility] from home with c/o weakness and difficulty swallowing for the past couple of days. He has felt like he has been choking on his food and liquid. BSE requested Clinical Impression: Pt presents with mild/moderate oropharyngeal dysphagia characterized by oral/pharyngeal weakness, repetitive/disorganized tongue rocking with puree/solid textures, diminished pharyngeal stripping  wave, and decreased epiglottic deflection. Decreased strength and coordination of swallowing results in residue of varying amounts; trace to min residue noted with thin liquids and increased pharyngeal residue noted in moderate amounts with puree and regular textures. Dry repeat swallows are effective in clearing pharyngeal residue. Note occasional flash frank penetration of thin liquids. Barium tablet with brief stasis at the level of the valleculae and silent aspiration of thin liquids when attempting to swallow the barium tablet. Esophageal sweep reveals standing column of barium with some to and fro movement. No radiologist present to confirm. Pt reports frequent nasal regurgitation, however this was not visualized on MBSS. Recommend progress diet according to GI recommendations; from oropharyngeal standpoint recommend D3/mech soft and thin liquids. Recommend meds be administered whole with puree. Pt will benefit from ST f/u at d/c venue to reinforce safe swallowing strategies and aspiration precautions. Pt is at increased risk for aspiration given esophageal component of swallowing. Thank you, Factors that may increase risk of adverse event in presence of aspiration [name] & [name] 2021): No data recorded Recommendations/Plan: Swallowing Evaluation Recommendations Swallowing Evaluation Recommendations Recommendations: PO diet PO Diet Recommendation: Dysphagia 3 (Mechanical soft); Thin liquids (Level 0) Liquid Administration via: Cup; Straw Medication Administration: Whole meds with puree Supervision: Patient able to self-feed; Intermittent supervision/cueing for swallowing strategies Swallowing strategies  : Small bites/sips; Multiple dry swallows after each bite/sip Postural changes: Position pt fully upright for meals Oral care recommendations: Oral care BID (2x/day) Treatment Plan Treatment Plan Treatment recommendations: Therapy as outlined in treatment plan below Follow-up recommendations: Skilled  nursing-short term rehab (<3 hours/day) Functional status assessment: Patient has had a recent decline in their functional status and demonstrates the ability to make significant improvements in function in a reasonable and predictable amount of time. Treatment frequency: Min 1x/week Treatment duration: 1 week Interventions: Aspiration precaution training; Diet toleration management by SLP Recommendations Recommendations for follow up therapy are one component of a multi-disciplinary discharge planning process, led by the attending physician.  Recommendations may be updated based on patient status, additional functional criteria and insurance authorization. Assessment: Orofacial Exam: Orofacial Exam Oral Cavity: Oral Hygiene: WFL Oral Cavity - Dentition: Poor condition Anatomy: Anatomy: WFL Boluses Administered: Boluses Administered Boluses Administered: Thin liquids (Level 0); Mildly thick liquids (Level 2, nectar thick); Moderately thick liquids (Level 3, honey thick); Puree; Solid  Oral Impairment Domain: Oral Impairment Domain Lip Closure: No labial escape Tongue control during bolus hold: Escape to lateral buccal cavity/floor of mouth; Posterior escape of less than half of bolus Bolus preparation/mastication: Disorganized chewing/mashing with solid pieces of bolus unchewed Bolus transport/lingual motion: Repetitive/disorganized tongue motion Oral residue: Trace residue lining oral structures Location of oral residue : Tongue; Palate Initiation of pharyngeal swallow : Posterior angle of the ramus  Pharyngeal Impairment Domain: Pharyngeal Impairment Domain Soft palate elevation: Trace column of contrast or air between SP and PW Laryngeal elevation: Partial  superior movement of thyroid cartilage/partial approximation of arytenoids to epiglottic petiole Anterior hyoid excursion: Partial anterior movement Epiglottic movement: Partial inversion Laryngeal vestibule closure: Incomplete, narrow column air/contrast in  laryngeal vestibule Pharyngeal stripping wave : Present - diminished Pharyngeal contraction (A/P view only): N/A Pharyngoesophageal segment opening: Complete distension and complete duration, no obstruction of flow Tongue base retraction: Trace column of contrast or air between tongue base and PPW Pharyngeal residue: Trace residue within or on pharyngeal structures; Collection of residue within or on pharyngeal structures Location of pharyngeal residue: Tongue base; Pharyngeal wall; Aryepiglottic folds; Valleculae; Diffuse (>3 areas)  Esophageal Impairment Domain: Esophageal Impairment Domain Esophageal clearance upright position: Esophageal retention with retrograde flow below pharyngoesophageal segment (PES) Pill: Pill Consistency administered: Thin liquids (Level 0) Thin liquids (Level 0): Impaired (see clinical impressions) Penetration/Aspiration Scale Score: Penetration/Aspiration Scale Score 1.  Material does not enter airway: Mildly thick liquids (Level 2, nectar thick); Moderately thick liquids (Level 3, honey thick); Puree; Solid; Pill 2.  Material enters airway, remains ABOVE vocal cords then ejected out: Thin liquids (Level 0) 8.  Material enters airway, passes BELOW cords without attempt by patient to eject out (silent aspiration) : Thin liquids (Level 0) (one episode when swallowing barium tablet) Compensatory Strategies: No data recorded  General Information: Caregiver present: No  Diet Prior to this Study: Clear liquid diet   Temperature : Normal   No data recorded  Supplemental O2: None (Room air)   History of Recent Intubation: No  Behavior/Cognition: Cooperative; Pleasant mood; Lethargic/Drowsy Self-Feeding Abilities: Able to self-feed Baseline vocal quality/speech: Normal Volitional Cough: Able to elicit Volitional Swallow: Able to elicit Exam Limitations: No limitations Goal Planning: Prognosis for improved oropharyngeal function: Fair No data recorded No data recorded No data recorded Consulted  and agree with results and recommendations: Patient; Nurse; Physician Pain: Pain Assessment Pain Assessment: No/denies pain End of Session: Start Time:SLP Start Time (ACUTE ONLY): 1059 Stop Time: SLP Stop Time (ACUTE ONLY): 1114 Time Calculation:SLP Time Calculation (min) (ACUTE ONLY): 15 min Charges: SLP Evaluations $ SLP Speech Visit: 1 Visit SLP Evaluations $MBS Swallow: 1 Procedure $Swallowing Treatment: 1 Procedure SLP visit diagnosis: SLP Visit Diagnosis: Dysphagia, oropharyngeal phase (R13.12); Dysphagia, unspecified (R13.10) Past Medical History: Past Medical History: Diagnosis Date  Arthritis   lower back  Chronic back pain   Disc disease  Chronic kidney disease   Coronary atherosclerosis of native coronary artery   Coronary calcifications by chest CT, Myoview demonstrating inferolateral scar  Deafness in right ear   Diabetes mellitus   Diabetic retinopathy (HCC)   moderate nonproliferative with macular edema in right eye  Diastolic dysfunction 12/2011  Grade 1.  Essential hypertension, benign   Heart murmur   in past  HOH (hard of hearing)   Hyperkalemia   Kidney stones   Mixed hyperlipidemia   NAFLD (nonalcoholic fatty liver disease)   Pancreatic insufficiency   Diagnosed at [facility]  Prostate cancer [facility]) 2011  Shortness of breath dyspnea   occasional - liver pressing on right lung, decreased capacity  Subdural hematoma (HCC)   Type 2 diabetes mellitus (HCC)   Wears hearing aid   left Past Surgical History: Past Surgical History: Procedure Laterality Date  A/V FISTULAGRAM Left 07/02/2023  Procedure: A/V Fistulagram;  Surgeon: Daria Pastures, MD;  Location: [facility] INVASIVE CV LAB;  Service: Vascular;  Laterality: Left;  APPENDECTOMY    AV FISTULA PLACEMENT Left 05/15/2021  Procedure: INSERTION OF LEFT ARM ARTERIOVENOUS (AV) GORE-TEX GRAFT;  Surgeon: Larina Earthly, MD;  Location: [facility] ORS;  Service: Vascular;  Laterality: Left;  Bilateral hip replacement    BIOPSY  09/17/2023  Procedure: BIOPSY;  Surgeon: Corbin Ade, MD;  Location: [facility] SU[facility];  Service: Endoscopy;;  CATARACT EXTRACTION W/PHACO Left 05/15/2015  Procedure: CATARACT EXTRACTION PHACO AND INTRAOCULAR LENS PLACEMENT (IOC);  Surgeon: Sherald Hess, MD;  Location: [facility] SURGERY CNTR;  Service: Ophthalmology;  Laterality: Left;  DIABETIC - insulin pump and oral meds  CATARACT EXTRACTION W/PHACO Right 08/28/2015  Procedure: CATARACT EXTRACTION PHACO AND INTRAOCULAR LENS PLACEMENT (IOC);  Surgeon: Sherald Hess, MD;  Location: [facility] SURGERY CNTR;  Service: Ophthalmology;  Laterality: Right;  DIABETIC PER PT AND CINDY PLEASE KEEP ARRIVAL TIME AFTER 8AM   CHOLECYSTECTOMY    CYSTOSCOPY W/ URETERAL STENT PLACEMENT Right 05/29/2023  Procedure: CYSTOSCOPY WITH RETROGRADE PYELOGRAM/URETERAL STENT PLACEMENT;  Surgeon: Malen Gauze, MD;  Location: [facility];  Service: Urology;  Laterality: Right;  CYSTOSCOPY WITH RETROGRADE PYELOGRAM, URETEROSCOPY AND STENT PLACEMENT Right 07/24/2023  Procedure: CYSTOSCOPY WITH RETROGRADE PYELOGRAM, URETEROSCOPY AND STENT PLACEMENT;  Surgeon: Malen Gauze, MD;  Location: [facility];  Service: Urology;  Laterality: Right;  dialysis pt  ESOPHAGEAL BRUSHING  09/17/2023  Procedure: ESOPHAGEAL BRUSHING;  Surgeon: Corbin Ade, MD;  Location: [facility];  Service: Endoscopy;;  ESOPHAGOGASTRODUODENOSCOPY (EGD) WITH PROPOFOL N/A 09/17/2023  Procedure: ESOPHAGOGASTRODUODENOSCOPY (EGD) WITH PROPOFOL;  Surgeon: Corbin Ade, MD;  Location: [facility];  Service: Endoscopy;  Laterality: N/A;  and dilation  HERNIA REPAIR    INSERTION OF DIALYSIS CATHETER Right 01/22/2021  Procedure: INSERTION OF DIALYSIS CATHETER;  Surgeon: Lucretia Roers, MD;  Location: [facility];  Service: General;  Laterality: Right;  INSERTION OF DIALYSIS CATHETER Right 04/16/2021  Procedure: INSERTION OF DIALYSIS CATHETER;  Surgeon: Lucretia Roers, MD;  Location: [facility];  Service: General;  Laterality: Right;  MALONEY DILATION   09/17/2023  Procedure: Elease Hashimoto DILATION;  Surgeon: Corbin Ade, MD;  Location: [facility];  Service: Endoscopy;;  PERIPHERAL VASCULAR BALLOON ANGIOPLASTY  07/02/2023  Procedure: PERIPHERAL VASCULAR BALLOON ANGIOPLASTY;  Surgeon: Daria Pastures, MD;  Location: [facility] INVASIVE CV LAB;  Service: Vascular;;  lt arm fistula  PERIPHERAL VASCULAR BALLOON ANGIOPLASTY  09/01/2023  Procedure: PERIPHERAL VASCULAR BALLOON ANGIOPLASTY;  Surgeon: Ethelene Hal, MD;  Location: [facility] INVASIVE CV LAB;  Service: Cardiovascular;;  50% Venous Anastomosis; 70% Intragraft  PERIPHERAL VASCULAR THROMBECTOMY N/A 09/01/2023  Procedure: PERIPHERAL VASCULAR THROMBECTOMY;  Surgeon: Ethelene Hal, MD;  Location: [facility] INVASIVE CV LAB;  Service: Cardiovascular;  Laterality: N/A;  PROSTATECTOMY  2011  STONE EXTRACTION WITH BASKET Right 07/24/2023  Procedure: STONE EXTRACTION WITH BASKET;  Surgeon: Malen Gauze, MD;  Location: [facility];  Service: Urology;  Laterality: Right; Amelia H. Romie Levee, CCC-SLP Speech Language Pathologist Georgetta Haber 09/19/2023, 12:34 PM  DG Chest Port 1 View Result Date: 09/16/2023 CLINICAL DATA:  Weakness and cough. EXAM: PORTABLE CHEST 1 VIEW COMPARISON:  Chest radiograph dated 08/15/2023. FINDINGS: Shallow inspiration. There is mild elevation of the right hemidiaphragm. Bibasilar atelectasis. No focal consolidation, pleural effusion, pneumothorax. Atherosclerotic calcification of the aorta. No acute osseous pathology. IMPRESSION: No acute findings. Electronically Signed   By: Elgie Collard M.D.   On: 09/16/2023 11:32   CT Head Wo Contrast Result Date: 09/16/2023 CLINICAL DATA:  Provided history: Head trauma, moderate/severe. Headache, neuro deficit. Additional history obtained from electronic MEDICAL RECORD NUMBERWeakness, difficulty swallowing food. EXAM: CT HEAD WITHOUT CONTRAST TECHNIQUE: Contiguous axial images were obtained from  the base of the skull through the vertex without intravenous contrast.  RADIATION DOSE REDUCTION: This exam was performed according to the departmental dose-optimization program which includes automated exposure control, adjustment of the mA and/or kV according to patient size and/or use of iterative reconstruction technique. COMPARISON:  Non-contrast head CT 08/15/2023. Brain MRI 09/06/2013. FINDINGS: Brain: Generalized cerebral atrophy. Patchy and ill-defined hypoattenuation within the cerebral white matter, nonspecific but compatible with mild-to-moderate chronic small vessel ischemic disease. There is no acute intracranial hemorrhage. No demarcated cortical infarct. No extra-axial fluid collection. No evidence of an intracranial mass. No midline shift. Vascular: No hyperdense vessel.  Atherosclerotic calcifications. Skull: No calvarial fracture or aggressive osseous lesion. Sinuses/Orbits: No mass or acute finding within the imaged orbits. 15 mm polypoid soft tissue focus, and minimal background mucosal thickening, within the right maxillary sinus. Minimal mucosal thickening within the left maxillary sinus at the imaged levels. Other: Small-volume fluid within right mastoid air cells. IMPRESSION: 1.  No evidence of an acute intracranial abnormality. 2. Parenchymal atrophy and chronic small vessel ischemic disease. 3. Paranasal sinus disease at the imaged levels (including a 15 mm polypoid soft tissue focus within the right maxillary sinus). Consider ENT referral. 4. Small right mastoid effusion. Electronically Signed   By: Jackey Loge D.O.   On: 09/16/2023 11:00     Discharge Exam: Vitals:   09/25/23 0400 09/26/23 0437  BP: (!) 124/35 (!) 114/48  Pulse: 82 (!) 46  Resp: 16 18  Temp:  98.2 F (36.8 C)  SpO2: 97% 93%   Vitals:   09/24/23 1500 09/24/23 2000 09/25/23 0400 09/26/23 0437  BP:  (!) 116/51 (!) 124/35 (!) 114/48  Pulse:  81 82 (!) 46  Resp:  16 16 18   Temp:  98.1 F (36.7 C)  98.2 F (36.8 C)  TempSrc: Oral     SpO2:  96% 97% 93%  Weight: 64.9 kg      Height: 5\' 8"  (1.727 m)       General: Pt is alert, awake, not in acute distress Cardiovascular: RRR, S1/S2 +, no rubs, no gallops Respiratory: CTA bilaterally, no wheezing, no rhonchi Abdominal: Soft, NT, ND, bowel sounds + Extremities: no edema, no cyanosis    The results of significant diagnostics from this hospitalization (including imaging, microbiology, ancillary and laboratory) are listed below for reference.     Microbiology: Recent Results (from the past 240 hours)  Resp panel by RT-PCR (RSV, Flu A&B, Covid) Anterior Nasal Swab     Status: None   Collection Time: 09/16/23 11:10 AM   Specimen: Anterior Nasal Swab  Result Value Ref Range Status   SARS Coronavirus 2 by RT PCR NEGATIVE NEGATIVE Final    Comment: (NOTE) SARS-CoV-2 target nucleic acids are NOT DETECTED.  The SARS-CoV-2 RNA is generally detectable in upper respiratory specimens during the acute phase of infection. The lowest concentration of SARS-CoV-2 viral copies this assay can detect is 138 copies/mL. A negative result does not preclude SARS-Cov-2 infection and should not be used as the sole basis for treatment or other patient management decisions. A negative result may occur with  improper specimen collection/handling, submission of specimen other than nasopharyngeal swab, presence of viral mutation(s) within the areas targeted by this assay, and inadequate number of viral copies(<138 copies/mL). A negative result must be combined with clinical observations, patient history, and epidemiological information. The expected result is Negative.  Fact Sheet for Patients:  BloggerCourse.com  Fact Sheet for Healthcare Providers:  SeriousBroker.it  This test  is no t yet approved or cleared by the [place] and  has been authorized for detection and/or diagnosis of SARS-CoV-2 by FDA under an Emergency Use Authorization (EUA). This EUA will remain   in effect (meaning this test can be used) for the duration of the COVID-19 declaration under Section 564(b)(1) of the Act, 21 U.S.C.section 360bbb-3(b)(1), unless the authorization is terminated  or revoked sooner.       Influenza A by PCR NEGATIVE NEGATIVE Final   Influenza B by PCR NEGATIVE NEGATIVE Final    Comment: (NOTE) The Xpert Xpress SARS-CoV-2/FLU/RSV plus assay is intended as an aid in the diagnosis of influenza from Nasopharyngeal swab specimens and should not be used as a sole basis for treatment. Nasal washings and aspirates are unacceptable for Xpert Xpress SARS-CoV-2/FLU/RSV testing.  Fact Sheet for Patients: BloggerCourse.com  Fact Sheet for Healthcare Providers: SeriousBroker.it  This test is not yet approved or cleared by the [place] FDA and has been authorized for detection and/or diagnosis of SARS-CoV-2 by FDA under an Emergency Use Authorization (EUA). This EUA will remain in effect (meaning this test can be used) for the duration of the COVID-19 declaration under Section 564(b)(1) of the Act, 21 U.S.C. section 360bbb-3(b)(1), unless the authorization is terminated or revoked.     Resp Syncytial Virus by PCR NEGATIVE NEGATIVE Final    Comment: (NOTE) Fact Sheet for Patients: BloggerCourse.com  Fact Sheet for Healthcare Providers: SeriousBroker.it  This test is not yet approved or cleared by the [place] FDA and has been authorized for detection and/or diagnosis of SARS-CoV-2 by FDA under an Emergency Use Authorization (EUA). This EUA will remain in effect (meaning this test can be used) for the duration of the COVID-19 declaration under Section 564(b)(1) of the Act, 21 U.S.C. section 360bbb-3(b)(1), unless the authorization is terminated or revoked.  Performed at [facility], [address] [place], [place] [address]   [facility] prep      Status: None   Collection Time: 09/17/23  1:07 PM   Specimen: PATH Cytology brushing; Body Fluid  Result Value Ref Range Status   Specimen Description ESOPHAGUS  Final   Special Requests NONE  Final   KOH Prep   Final    BUDDING YEAST SEEN YEAST WITH PSEUDOHYPHAE Performed at [facility], [address], [place], [place] [address]    Report Status 09/17/2023 FINAL  Final     Labs: BNP (last 3 results) No results for input(s): "BNP" in the last 8760 hours. Basic Metabolic Panel: Recent Labs  Lab 09/19/23 1441 09/22/23 1056  NA 129* 131*  K 3.5 3.3*  CL 95* 96*  CO2 23 24  GLUCOSE 190* 88  BUN 37* 26*  CREATININE 3.45* 3.72*  CALCIUM 6.8* 7.4*  PHOS 3.8 4.0   Liver Function Tests: Recent Labs  Lab 09/19/23 1441 09/22/23 1056  ALBUMIN 1.6* 1.7*   No results for input(s): "LIPASE", "AMYLASE" in the last 168 hours. No results for input(s): "AMMONIA" in the last 168 hours. CBC: Recent Labs  Lab 09/19/23 1441 09/22/23 1056  WBC 9.6 8.5  HGB 7.1* 7.7*  HCT 22.5* 25.3*  MCV 104.7* 105.4*  PLT 107* 132*   Cardiac Enzymes: No results for input(s): "CKTOTAL", "CKMB", "CKMBINDEX", "TROPONINI" in the last 168 hours. BNP: Invalid input(s): "POCBNP" CBG: Recent Labs  Lab 09/22/23 0820 09/22/23 0849 09/22/23 0954 09/22/23 1115 09/22/23 1619  GLUCAP 100* 88 97 86 68*   D-Dimer No results for input(s): "DDIMER" in the  last 72 hours. Hgb A1c No results for input(s): "HGBA1C" in the last 72 hours. Lipid Profile No results for input(s): "CHOL", "HDL", "LDLCALC", "TRIG", "CHOLHDL", "LDLDIRECT" in the last 72 hours. Thyroid function studies No results for input(s): "TSH", "T4TOTAL", "T3FREE", "THYROIDAB" in the last 72 hours.  Invalid input(s): "FREET3" Anemia work up No results for input(s): "VITAMINB12", "FOLATE", "FERRITIN", "TIBC", "IRON", "RETICCTPCT" in the last 72 hours. Urinalysis    Component Value Date/Time   COLORURINE YELLOW 05/28/2023 1945    APPEARANCEUR Cloudy (A) 07/17/2023 1450   LABSPEC 1.011 05/28/2023 1945   PHURINE 7.0 05/28/2023 1945   GLUCOSEU 1+ (A) 07/17/2023 1450   HGBUR MODERATE (A) 05/28/2023 1945   BILIRUBINUR Negative 07/17/2023 1450   KETONESUR NEGATIVE 05/28/2023 1945   PROTEINUR 2+ (A) 07/17/2023 1450   PROTEINUR 100 (A) 05/28/2023 1945   UROBILINOGEN 0.2 03/20/2013 2016   NITRITE Negative 07/17/2023 1450   NITRITE NEGATIVE 05/28/2023 1945   LEUKOCYTESUR 2+ (A) 07/17/2023 1450   LEUKOCYTESUR MODERATE (A) 05/28/2023 1945   Sepsis Labs Recent Labs  Lab 09/19/23 1441 09/22/23 1056  WBC 9.6 8.5   Microbiology Recent Results (from the past 240 hours)  Resp panel by RT-PCR (RSV, Flu A&B, Covid) Anterior Nasal Swab     Status: None   Collection Time: 09/16/23 11:10 AM   Specimen: Anterior Nasal Swab  Result Value Ref Range Status   SARS Coronavirus 2 by RT PCR NEGATIVE NEGATIVE Final    Comment: (NOTE) SARS-CoV-2 target nucleic acids are NOT DETECTED.  The SARS-CoV-2 RNA is generally detectable in upper respiratory specimens during the acute phase of infection. The lowest concentration of SARS-CoV-2 viral copies this assay can detect is 138 copies/mL. A negative result does not preclude SARS-Cov-2 infection and should not be used as the sole basis for treatment or other patient management decisions. A negative result may occur with  improper specimen collection/handling, submission of specimen other than nasopharyngeal swab, presence of viral mutation(s) within the areas targeted by this assay, and inadequate number of viral copies(<138 copies/mL). A negative result must be combined with clinical observations, patient history, and epidemiological information. The expected result is Negative.  Fact Sheet for Patients:  BloggerCourse.com  Fact Sheet for Healthcare Providers:  SeriousBroker.it  This test is no t yet approved or cleared by the  [place] FDA and  has been authorized for detection and/or diagnosis of SARS-CoV-2 by FDA under an Emergency Use Authorization (EUA). This EUA will remain  in effect (meaning this test can be used) for the duration of the COVID-19 declaration under Section 564(b)(1) of the Act, 21 U.S.C.section 360bbb-3(b)(1), unless the authorization is terminated  or revoked sooner.       Influenza A by PCR NEGATIVE NEGATIVE Final   Influenza B by PCR NEGATIVE NEGATIVE Final    Comment: (NOTE) The Xpert Xpress SARS-CoV-2/FLU/RSV plus assay is intended as an aid in the diagnosis of influenza from Nasopharyngeal swab specimens and should not be used as a sole basis for treatment. Nasal washings and aspirates are unacceptable for Xpert Xpress SARS-CoV-2/FLU/RSV testing.  Fact Sheet for Patients: BloggerCourse.com  Fact Sheet for Healthcare Providers: SeriousBroker.it  This test is not yet approved or cleared by the [place] FDA and has been authorized for detection and/or diagnosis of SARS-CoV-2 by FDA under an Emergency Use Authorization (EUA). This EUA will remain in effect (meaning this test can be used) for the duration of the COVID-19 declaration under Section 564(b)(1) of the Act, 21 U.S.C. section  360bbb-3(b)(1), unless the authorization is terminated or revoked.     Resp Syncytial Virus by PCR NEGATIVE NEGATIVE Final    Comment: (NOTE) Fact Sheet for Patients: BloggerCourse.com  Fact Sheet for Healthcare Providers: SeriousBroker.it  This test is not yet approved or cleared by the [place] FDA and has been authorized for detection and/or diagnosis of SARS-CoV-2 by FDA under an Emergency Use Authorization (EUA). This EUA will remain in effect (meaning this test can be used) for the duration of the COVID-19 declaration under Section 564(b)(1) of the Act, 21 U.S.C. section  360bbb-3(b)(1), unless the authorization is terminated or revoked.  Performed at [facility], [address], [place], [place] [address]   [facility] prep     Status: None   Collection Time: 09/17/23  1:07 PM   Specimen: PATH Cytology brushing; Body Fluid  Result Value Ref Range Status   Specimen Description ESOPHAGUS  Final   Special Requests NONE  Final   KOH Prep   Final    BUDDING YEAST SEEN YEAST WITH PSEUDOHYPHAE Performed at [facility], [address], [place], [place] [address]    Report Status 09/17/2023 FINAL  Final     Time coordinating discharge: 35 minutes  SIGNED:   Erick Blinks, DO [facility] 09/26/2023, 10:22 AM  If 7PM-7AM, please contact night-coverage [email or web address]

## 2023-09-29 ENCOUNTER — Ambulatory Visit: Attending: Nurse Practitioner

## 2023-09-30 ENCOUNTER — Encounter: Payer: Self-pay | Admitting: Nurse Practitioner

## 2023-10-02 ENCOUNTER — Encounter: Payer: PPO | Admitting: Student in an Organized Health Care Education/Training Program

## 2023-10-03 DIAGNOSIS — N186 End stage renal disease: Secondary | ICD-10-CM | POA: Diagnosis not present

## 2023-10-03 DIAGNOSIS — Z992 Dependence on renal dialysis: Secondary | ICD-10-CM | POA: Diagnosis not present

## 2023-10-03 DIAGNOSIS — E1122 Type 2 diabetes mellitus with diabetic chronic kidney disease: Secondary | ICD-10-CM | POA: Diagnosis not present

## 2023-10-04 DEATH — deceased

## 2023-10-07 ENCOUNTER — Telehealth: Payer: Self-pay | Admitting: Cardiology

## 2023-10-07 NOTE — Telephone Encounter (Signed)
 Wife (Darlene) called to report patient passed away.

## 2024-01-08 ENCOUNTER — Ambulatory Visit: Payer: PPO | Admitting: Nurse Practitioner

## 2024-01-27 ENCOUNTER — Other Ambulatory Visit (HOSPITAL_COMMUNITY): Payer: PPO

## 2024-02-05 ENCOUNTER — Ambulatory Visit: Payer: PPO | Admitting: Urology
